# Patient Record
Sex: Female | Born: 1970
Health system: Southern US, Community
[De-identification: ages and names within clinical notes are randomized; demographics above are authoritative.]

## PROBLEM LIST (undated history)

## (undated) DIAGNOSIS — F209 Schizophrenia, unspecified: Secondary | ICD-10-CM

## (undated) DIAGNOSIS — F319 Bipolar disorder, unspecified: Secondary | ICD-10-CM

## (undated) DIAGNOSIS — J449 Chronic obstructive pulmonary disease, unspecified: Secondary | ICD-10-CM

## (undated) DIAGNOSIS — I509 Heart failure, unspecified: Secondary | ICD-10-CM

## (undated) DIAGNOSIS — Z5189 Encounter for other specified aftercare: Secondary | ICD-10-CM

## (undated) DIAGNOSIS — G43909 Migraine, unspecified, not intractable, without status migrainosus: Secondary | ICD-10-CM

## (undated) DIAGNOSIS — F32A Depression, unspecified: Secondary | ICD-10-CM

## (undated) DIAGNOSIS — F329 Major depressive disorder, single episode, unspecified: Secondary | ICD-10-CM

## (undated) DIAGNOSIS — F419 Anxiety disorder, unspecified: Secondary | ICD-10-CM

## (undated) HISTORY — PX: KNEE ARTHROSCOPY: SUR90

## (undated) HISTORY — PX: KNEE SURGERY: SHX244

## (undated) HISTORY — PX: TUBAL LIGATION: SHX77

---

## 1997-07-27 ENCOUNTER — Encounter (HOSPITAL_COMMUNITY): Admission: RE | Admit: 1997-07-27 | Discharge: 1997-09-01 | Payer: Self-pay | Admitting: Obstetrics

## 1997-08-31 ENCOUNTER — Inpatient Hospital Stay (HOSPITAL_COMMUNITY): Admission: AD | Admit: 1997-08-31 | Discharge: 1997-09-03 | Payer: Self-pay | Admitting: Obstetrics

## 1997-10-12 ENCOUNTER — Encounter: Admission: RE | Admit: 1997-10-12 | Discharge: 1997-10-12 | Payer: Self-pay | Admitting: Family Medicine

## 1997-12-05 ENCOUNTER — Emergency Department (HOSPITAL_COMMUNITY): Admission: EM | Admit: 1997-12-05 | Discharge: 1997-12-05 | Payer: Self-pay | Admitting: Emergency Medicine

## 1997-12-06 ENCOUNTER — Emergency Department (HOSPITAL_COMMUNITY): Admission: EM | Admit: 1997-12-06 | Discharge: 1997-12-06 | Payer: Self-pay | Admitting: Emergency Medicine

## 1997-12-07 ENCOUNTER — Inpatient Hospital Stay (HOSPITAL_COMMUNITY): Admission: AD | Admit: 1997-12-07 | Discharge: 1997-12-13 | Payer: Self-pay | Admitting: *Deleted

## 1997-12-07 ENCOUNTER — Emergency Department (HOSPITAL_COMMUNITY): Admission: EM | Admit: 1997-12-07 | Discharge: 1997-12-07 | Payer: Self-pay | Admitting: Emergency Medicine

## 1998-05-15 ENCOUNTER — Inpatient Hospital Stay (HOSPITAL_COMMUNITY): Admission: AD | Admit: 1998-05-15 | Discharge: 1998-05-16 | Payer: Self-pay | Admitting: Family Medicine

## 1998-05-23 ENCOUNTER — Emergency Department (HOSPITAL_COMMUNITY): Admission: EM | Admit: 1998-05-23 | Discharge: 1998-05-23 | Payer: Self-pay | Admitting: Emergency Medicine

## 1998-05-30 ENCOUNTER — Encounter: Admission: RE | Admit: 1998-05-30 | Discharge: 1998-05-30 | Payer: Self-pay | Admitting: Sports Medicine

## 1998-06-20 ENCOUNTER — Encounter: Admission: RE | Admit: 1998-06-20 | Discharge: 1998-06-20 | Payer: Self-pay | Admitting: Sports Medicine

## 1998-07-11 ENCOUNTER — Encounter: Admission: RE | Admit: 1998-07-11 | Discharge: 1998-07-11 | Payer: Self-pay | Admitting: Family Medicine

## 1998-07-17 ENCOUNTER — Emergency Department (HOSPITAL_COMMUNITY): Admission: EM | Admit: 1998-07-17 | Discharge: 1998-07-17 | Payer: Self-pay | Admitting: Emergency Medicine

## 1998-09-20 ENCOUNTER — Emergency Department (HOSPITAL_COMMUNITY): Admission: EM | Admit: 1998-09-20 | Discharge: 1998-09-20 | Payer: Self-pay | Admitting: Internal Medicine

## 1998-10-11 ENCOUNTER — Encounter: Admission: RE | Admit: 1998-10-11 | Discharge: 1998-10-11 | Payer: Self-pay | Admitting: Family Medicine

## 1998-10-25 ENCOUNTER — Inpatient Hospital Stay (HOSPITAL_COMMUNITY): Admission: EM | Admit: 1998-10-25 | Discharge: 1998-11-07 | Payer: Self-pay | Admitting: Emergency Medicine

## 1998-10-25 ENCOUNTER — Encounter: Admission: RE | Admit: 1998-10-25 | Discharge: 1998-10-25 | Payer: Self-pay | Admitting: Family Medicine

## 1999-03-08 ENCOUNTER — Emergency Department (HOSPITAL_COMMUNITY): Admission: EM | Admit: 1999-03-08 | Discharge: 1999-03-08 | Payer: Self-pay | Admitting: Emergency Medicine

## 1999-03-08 ENCOUNTER — Encounter: Payer: Self-pay | Admitting: Emergency Medicine

## 1999-03-15 ENCOUNTER — Encounter: Admission: RE | Admit: 1999-03-15 | Discharge: 1999-03-15 | Payer: Self-pay | Admitting: Family Medicine

## 1999-03-29 ENCOUNTER — Encounter: Admission: RE | Admit: 1999-03-29 | Discharge: 1999-03-29 | Payer: Self-pay | Admitting: Family Medicine

## 1999-04-02 ENCOUNTER — Encounter: Admission: RE | Admit: 1999-04-02 | Discharge: 1999-04-02 | Payer: Self-pay | Admitting: Family Medicine

## 1999-04-03 ENCOUNTER — Emergency Department (HOSPITAL_COMMUNITY): Admission: EM | Admit: 1999-04-03 | Discharge: 1999-04-03 | Payer: Self-pay | Admitting: Emergency Medicine

## 1999-04-03 ENCOUNTER — Encounter: Payer: Self-pay | Admitting: Family Medicine

## 1999-04-05 ENCOUNTER — Other Ambulatory Visit: Admission: RE | Admit: 1999-04-05 | Discharge: 1999-04-05 | Payer: Self-pay | Admitting: *Deleted

## 1999-04-05 ENCOUNTER — Encounter: Admission: RE | Admit: 1999-04-05 | Discharge: 1999-04-05 | Payer: Self-pay | Admitting: Family Medicine

## 1999-04-13 ENCOUNTER — Encounter: Admission: RE | Admit: 1999-04-13 | Discharge: 1999-04-13 | Payer: Self-pay | Admitting: Family Medicine

## 1999-05-01 ENCOUNTER — Encounter: Admission: RE | Admit: 1999-05-01 | Discharge: 1999-05-01 | Payer: Self-pay | Admitting: Sports Medicine

## 1999-05-03 ENCOUNTER — Emergency Department (HOSPITAL_COMMUNITY): Admission: EM | Admit: 1999-05-03 | Discharge: 1999-05-03 | Payer: Self-pay | Admitting: Emergency Medicine

## 1999-05-03 ENCOUNTER — Encounter: Payer: Self-pay | Admitting: Emergency Medicine

## 1999-06-25 ENCOUNTER — Encounter: Admission: RE | Admit: 1999-06-25 | Discharge: 1999-06-25 | Payer: Self-pay | Admitting: Family Medicine

## 1999-07-09 ENCOUNTER — Encounter: Admission: RE | Admit: 1999-07-09 | Discharge: 1999-07-09 | Payer: Self-pay | Admitting: Family Medicine

## 1999-08-15 ENCOUNTER — Inpatient Hospital Stay (HOSPITAL_COMMUNITY): Admission: EM | Admit: 1999-08-15 | Discharge: 1999-08-16 | Payer: Self-pay | Admitting: Emergency Medicine

## 1999-08-15 ENCOUNTER — Encounter: Payer: Self-pay | Admitting: Family Medicine

## 1999-09-17 ENCOUNTER — Encounter: Payer: Self-pay | Admitting: *Deleted

## 1999-09-18 ENCOUNTER — Inpatient Hospital Stay (HOSPITAL_COMMUNITY): Admission: EM | Admit: 1999-09-18 | Discharge: 1999-09-27 | Payer: Self-pay | Admitting: Emergency Medicine

## 1999-09-18 ENCOUNTER — Encounter: Payer: Self-pay | Admitting: Family Medicine

## 1999-09-19 ENCOUNTER — Encounter: Payer: Self-pay | Admitting: Urology

## 1999-09-20 ENCOUNTER — Encounter: Payer: Self-pay | Admitting: Family Medicine

## 1999-09-27 ENCOUNTER — Encounter: Payer: Self-pay | Admitting: Urology

## 1999-09-27 ENCOUNTER — Inpatient Hospital Stay (HOSPITAL_COMMUNITY): Admission: AD | Admit: 1999-09-27 | Discharge: 1999-10-09 | Payer: Self-pay | Admitting: *Deleted

## 1999-12-05 ENCOUNTER — Encounter: Admission: RE | Admit: 1999-12-05 | Discharge: 1999-12-05 | Payer: Self-pay | Admitting: Family Medicine

## 2000-01-29 ENCOUNTER — Encounter: Admission: RE | Admit: 2000-01-29 | Discharge: 2000-01-29 | Payer: Self-pay | Admitting: Sports Medicine

## 2000-03-10 ENCOUNTER — Encounter: Admission: RE | Admit: 2000-03-10 | Discharge: 2000-03-10 | Payer: Self-pay | Admitting: Family Medicine

## 2000-03-24 ENCOUNTER — Encounter: Admission: RE | Admit: 2000-03-24 | Discharge: 2000-03-24 | Payer: Self-pay | Admitting: Family Medicine

## 2001-01-29 ENCOUNTER — Encounter: Admission: RE | Admit: 2001-01-29 | Discharge: 2001-01-29 | Payer: Self-pay | Admitting: Family Medicine

## 2001-02-10 ENCOUNTER — Encounter: Admission: RE | Admit: 2001-02-10 | Discharge: 2001-02-10 | Payer: Self-pay | Admitting: Sports Medicine

## 2001-07-03 ENCOUNTER — Encounter: Admission: RE | Admit: 2001-07-03 | Discharge: 2001-07-03 | Payer: Self-pay | Admitting: Family Medicine

## 2002-03-26 ENCOUNTER — Encounter: Admission: RE | Admit: 2002-03-26 | Discharge: 2002-03-26 | Payer: Self-pay | Admitting: Family Medicine

## 2002-07-01 ENCOUNTER — Encounter: Admission: RE | Admit: 2002-07-01 | Discharge: 2002-07-01 | Payer: Self-pay | Admitting: Family Medicine

## 2002-09-15 ENCOUNTER — Emergency Department (HOSPITAL_COMMUNITY): Admission: EM | Admit: 2002-09-15 | Discharge: 2002-09-15 | Payer: Self-pay | Admitting: Emergency Medicine

## 2003-12-22 ENCOUNTER — Encounter: Admission: RE | Admit: 2003-12-22 | Discharge: 2003-12-22 | Payer: Self-pay | Admitting: Family Medicine

## 2004-01-20 ENCOUNTER — Encounter: Admission: RE | Admit: 2004-01-20 | Discharge: 2004-01-20 | Payer: Self-pay | Admitting: Family Medicine

## 2004-02-05 ENCOUNTER — Emergency Department (HOSPITAL_COMMUNITY): Admission: EM | Admit: 2004-02-05 | Discharge: 2004-02-05 | Payer: Self-pay | Admitting: Emergency Medicine

## 2004-02-10 ENCOUNTER — Emergency Department (HOSPITAL_COMMUNITY): Admission: EM | Admit: 2004-02-10 | Discharge: 2004-02-10 | Payer: Self-pay | Admitting: Emergency Medicine

## 2004-06-15 ENCOUNTER — Ambulatory Visit: Payer: Self-pay | Admitting: Family Medicine

## 2004-08-01 ENCOUNTER — Encounter: Admission: RE | Admit: 2004-08-01 | Discharge: 2004-10-08 | Payer: Self-pay | Admitting: Orthopedic Surgery

## 2004-09-28 ENCOUNTER — Ambulatory Visit: Payer: Self-pay | Admitting: Family Medicine

## 2004-12-28 ENCOUNTER — Ambulatory Visit: Payer: Self-pay | Admitting: Family Medicine

## 2005-04-09 ENCOUNTER — Other Ambulatory Visit: Admission: RE | Admit: 2005-04-09 | Discharge: 2005-04-09 | Payer: Self-pay | Admitting: Family Medicine

## 2005-04-09 ENCOUNTER — Ambulatory Visit: Payer: Self-pay | Admitting: Family Medicine

## 2005-06-27 ENCOUNTER — Ambulatory Visit: Payer: Self-pay | Admitting: Family Medicine

## 2005-10-28 ENCOUNTER — Ambulatory Visit: Payer: Self-pay | Admitting: Sports Medicine

## 2006-03-28 ENCOUNTER — Ambulatory Visit: Payer: Self-pay | Admitting: Family Medicine

## 2006-09-01 ENCOUNTER — Emergency Department (HOSPITAL_COMMUNITY): Admission: EM | Admit: 2006-09-01 | Discharge: 2006-09-01 | Payer: Self-pay | Admitting: Emergency Medicine

## 2006-09-02 ENCOUNTER — Emergency Department (HOSPITAL_COMMUNITY): Admission: EM | Admit: 2006-09-02 | Discharge: 2006-09-03 | Payer: Self-pay | Admitting: Emergency Medicine

## 2006-09-05 ENCOUNTER — Ambulatory Visit: Payer: Self-pay | Admitting: *Deleted

## 2006-09-05 ENCOUNTER — Inpatient Hospital Stay (HOSPITAL_COMMUNITY): Admission: AD | Admit: 2006-09-05 | Discharge: 2006-09-15 | Payer: Self-pay | Admitting: *Deleted

## 2006-10-02 ENCOUNTER — Telehealth: Payer: Self-pay | Admitting: *Deleted

## 2006-11-18 ENCOUNTER — Telehealth: Payer: Self-pay | Admitting: Psychology

## 2006-12-17 ENCOUNTER — Telehealth: Payer: Self-pay | Admitting: Psychology

## 2006-12-23 ENCOUNTER — Encounter: Payer: Self-pay | Admitting: Family Medicine

## 2006-12-24 ENCOUNTER — Ambulatory Visit: Payer: Self-pay | Admitting: Family Medicine

## 2006-12-24 ENCOUNTER — Encounter: Payer: Self-pay | Admitting: Family Medicine

## 2006-12-24 LAB — CONVERTED CEMR LAB
AST: 8 units/L (ref 0–37)
CO2: 23 meq/L (ref 19–32)
Glucose, Bld: 85 mg/dL (ref 70–99)
LDL Cholesterol: 79 mg/dL (ref 0–99)
Potassium: 3.9 meq/L (ref 3.5–5.3)
Total Bilirubin: 0.3 mg/dL (ref 0.3–1.2)
Triglycerides: 259 mg/dL — ABNORMAL HIGH (ref <150)
VLDL: 52 mg/dL — ABNORMAL HIGH (ref 0–40)

## 2006-12-25 ENCOUNTER — Encounter: Payer: Self-pay | Admitting: Family Medicine

## 2007-01-26 ENCOUNTER — Encounter: Payer: Self-pay | Admitting: Psychology

## 2007-02-04 ENCOUNTER — Ambulatory Visit: Payer: Self-pay | Admitting: Family Medicine

## 2007-02-27 ENCOUNTER — Encounter: Payer: Self-pay | Admitting: Family Medicine

## 2007-03-04 ENCOUNTER — Other Ambulatory Visit: Admission: RE | Admit: 2007-03-04 | Discharge: 2007-03-04 | Payer: Self-pay | Admitting: Family Medicine

## 2007-03-04 ENCOUNTER — Ambulatory Visit: Payer: Self-pay | Admitting: Family Medicine

## 2007-03-04 ENCOUNTER — Encounter: Payer: Self-pay | Admitting: Family Medicine

## 2007-03-04 LAB — CONVERTED CEMR LAB
Hep B S Ab: NEGATIVE
KOH Prep: POSITIVE

## 2007-03-05 ENCOUNTER — Telehealth: Payer: Self-pay | Admitting: Family Medicine

## 2007-03-05 ENCOUNTER — Encounter: Payer: Self-pay | Admitting: Family Medicine

## 2007-03-05 DIAGNOSIS — G43909 Migraine, unspecified, not intractable, without status migrainosus: Secondary | ICD-10-CM | POA: Insufficient documentation

## 2007-04-20 ENCOUNTER — Telehealth: Payer: Self-pay | Admitting: Family Medicine

## 2007-04-29 ENCOUNTER — Ambulatory Visit: Payer: Self-pay | Admitting: Psychology

## 2007-04-29 ENCOUNTER — Encounter: Payer: Self-pay | Admitting: *Deleted

## 2007-04-29 LAB — CONVERTED CEMR LAB
ALT: 13 units/L (ref 0–35)
AST: 9 units/L (ref 0–37)
Albumin: 4.6 g/dL (ref 3.5–5.2)
Alkaline Phosphatase: 59 units/L (ref 39–117)
LDL Cholesterol: 121 mg/dL — ABNORMAL HIGH (ref 0–99)
Potassium: 4.2 meq/L (ref 3.5–5.3)
Sodium: 140 meq/L (ref 135–145)
TSH: 0.594 microintl units/mL (ref 0.350–5.50)
Total Bilirubin: 0.3 mg/dL (ref 0.3–1.2)
Total Protein: 7.2 g/dL (ref 6.0–8.3)
Triglycerides: 213 mg/dL — ABNORMAL HIGH (ref <150)
VLDL: 43 mg/dL — ABNORMAL HIGH (ref 0–40)

## 2007-05-27 ENCOUNTER — Telehealth: Payer: Self-pay | Admitting: Psychology

## 2007-07-01 ENCOUNTER — Ambulatory Visit: Payer: Self-pay | Admitting: Psychology

## 2007-09-15 ENCOUNTER — Telehealth: Payer: Self-pay | Admitting: Psychology

## 2007-12-30 ENCOUNTER — Encounter: Payer: Self-pay | Admitting: Psychology

## 2007-12-30 ENCOUNTER — Ambulatory Visit: Payer: Self-pay | Admitting: Family Medicine

## 2007-12-30 LAB — CONVERTED CEMR LAB
Blood Glucose, Fingerstick: 139
Hgb A1c MFr Bld: 6.5 %

## 2007-12-31 LAB — CONVERTED CEMR LAB
CO2: 21 meq/L (ref 19–32)
Calcium: 9.3 mg/dL (ref 8.4–10.5)
Creatinine, Ser: 0.72 mg/dL (ref 0.40–1.20)
Sodium: 139 meq/L (ref 135–145)

## 2008-01-13 ENCOUNTER — Ambulatory Visit: Payer: Self-pay | Admitting: Psychology

## 2008-02-10 ENCOUNTER — Encounter: Payer: Self-pay | Admitting: Psychology

## 2008-02-11 ENCOUNTER — Telehealth: Payer: Self-pay | Admitting: Psychology

## 2008-02-16 ENCOUNTER — Encounter: Payer: Self-pay | Admitting: Family Medicine

## 2008-03-04 ENCOUNTER — Encounter: Payer: Self-pay | Admitting: Psychology

## 2008-03-15 ENCOUNTER — Telehealth: Payer: Self-pay | Admitting: Psychology

## 2008-03-30 ENCOUNTER — Ambulatory Visit: Payer: Self-pay | Admitting: Family Medicine

## 2008-07-04 ENCOUNTER — Telehealth: Payer: Self-pay | Admitting: Psychology

## 2008-09-14 ENCOUNTER — Emergency Department (HOSPITAL_COMMUNITY): Admission: EM | Admit: 2008-09-14 | Discharge: 2008-09-14 | Payer: Self-pay | Admitting: Emergency Medicine

## 2008-11-22 ENCOUNTER — Emergency Department (HOSPITAL_COMMUNITY): Admission: EM | Admit: 2008-11-22 | Discharge: 2008-11-22 | Payer: Self-pay | Admitting: Emergency Medicine

## 2008-11-24 ENCOUNTER — Telehealth: Payer: Self-pay | Admitting: Family Medicine

## 2008-11-25 ENCOUNTER — Ambulatory Visit: Payer: Self-pay | Admitting: Family Medicine

## 2008-11-25 ENCOUNTER — Encounter: Payer: Self-pay | Admitting: *Deleted

## 2008-11-25 DIAGNOSIS — F312 Bipolar disorder, current episode manic severe with psychotic features: Secondary | ICD-10-CM

## 2008-11-25 LAB — CONVERTED CEMR LAB
Glucose, Urine, Semiquant: NEGATIVE
Ketones, urine, test strip: NEGATIVE
Nitrite: NEGATIVE
Specific Gravity, Urine: 1.02
pH: 7

## 2008-12-02 ENCOUNTER — Telehealth (INDEPENDENT_AMBULATORY_CARE_PROVIDER_SITE_OTHER): Payer: Self-pay | Admitting: Family Medicine

## 2008-12-05 ENCOUNTER — Telehealth: Payer: Self-pay | Admitting: Family Medicine

## 2008-12-06 ENCOUNTER — Telehealth: Payer: Self-pay | Admitting: Family Medicine

## 2008-12-07 ENCOUNTER — Telehealth: Payer: Self-pay | Admitting: Family Medicine

## 2008-12-13 ENCOUNTER — Ambulatory Visit: Payer: Self-pay | Admitting: Family Medicine

## 2008-12-13 DIAGNOSIS — F17200 Nicotine dependence, unspecified, uncomplicated: Secondary | ICD-10-CM

## 2008-12-23 ENCOUNTER — Telehealth: Payer: Self-pay | Admitting: Family Medicine

## 2009-10-14 ENCOUNTER — Telehealth: Payer: Self-pay | Admitting: Family Medicine

## 2009-10-15 ENCOUNTER — Emergency Department (HOSPITAL_COMMUNITY): Admission: EM | Admit: 2009-10-15 | Discharge: 2009-10-15 | Payer: Self-pay | Admitting: Emergency Medicine

## 2009-10-16 ENCOUNTER — Telehealth: Payer: Self-pay | Admitting: Family Medicine

## 2009-10-17 ENCOUNTER — Emergency Department (HOSPITAL_COMMUNITY): Admission: EM | Admit: 2009-10-17 | Discharge: 2009-10-18 | Payer: Self-pay | Admitting: Emergency Medicine

## 2009-10-17 ENCOUNTER — Encounter: Payer: Self-pay | Admitting: Family Medicine

## 2009-10-20 ENCOUNTER — Ambulatory Visit: Payer: Self-pay | Admitting: Diagnostic Radiology

## 2009-10-20 ENCOUNTER — Emergency Department (HOSPITAL_BASED_OUTPATIENT_CLINIC_OR_DEPARTMENT_OTHER): Admission: EM | Admit: 2009-10-20 | Discharge: 2009-10-20 | Payer: Self-pay | Admitting: Emergency Medicine

## 2009-10-26 ENCOUNTER — Ambulatory Visit: Payer: Self-pay | Admitting: Family Medicine

## 2009-10-26 ENCOUNTER — Encounter: Payer: Self-pay | Admitting: Family Medicine

## 2009-10-26 DIAGNOSIS — E669 Obesity, unspecified: Secondary | ICD-10-CM | POA: Insufficient documentation

## 2009-10-26 DIAGNOSIS — I10 Essential (primary) hypertension: Secondary | ICD-10-CM | POA: Insufficient documentation

## 2009-10-26 DIAGNOSIS — E119 Type 2 diabetes mellitus without complications: Secondary | ICD-10-CM | POA: Insufficient documentation

## 2009-10-26 DIAGNOSIS — R209 Unspecified disturbances of skin sensation: Secondary | ICD-10-CM | POA: Insufficient documentation

## 2009-10-26 LAB — CONVERTED CEMR LAB: Hgb A1c MFr Bld: 7.9 %

## 2009-10-27 ENCOUNTER — Encounter: Payer: Self-pay | Admitting: Family Medicine

## 2009-10-27 LAB — CONVERTED CEMR LAB
ALT: 16 units/L (ref 0–35)
AST: 12 units/L (ref 0–37)
Albumin: 4.9 g/dL (ref 3.5–5.2)
Alkaline Phosphatase: 94 units/L (ref 39–117)
BUN: 10 mg/dL (ref 6–23)
CO2: 26 meq/L (ref 19–32)
Calcium: 9.8 mg/dL (ref 8.4–10.5)
Chloride: 97 meq/L (ref 96–112)
Cholesterol: 216 mg/dL — ABNORMAL HIGH (ref 0–200)
Creatinine, Ser: 0.89 mg/dL (ref 0.40–1.20)
Folate: 5.6 ng/mL
Glucose, Bld: 129 mg/dL — ABNORMAL HIGH (ref 70–99)
HCT: 43.8 % (ref 36.0–46.0)
HDL: 46 mg/dL (ref >39)
Hemoglobin: 14.3 g/dL (ref 12.0–15.0)
MCHC: 32.6 g/dL (ref 30.0–36.0)
MCV: 86.9 fL (ref 78.0–100.0)
Platelets: 271 10*3/uL (ref 150–400)
Potassium: 3.8 meq/L (ref 3.5–5.3)
RBC: 5.04 M/uL (ref 3.87–5.11)
RDW: 13.3 % (ref 11.5–15.5)
Sodium: 136 meq/L (ref 135–145)
TSH: 2.241 microintl units/mL (ref 0.350–4.500)
Total Bilirubin: 0.3 mg/dL (ref 0.3–1.2)
Total CHOL/HDL Ratio: 4.7
Total Protein: 7.5 g/dL (ref 6.0–8.3)
Triglycerides: 466 mg/dL — ABNORMAL HIGH (ref <150)
Vit D, 25-Hydroxy: 14 ng/mL — ABNORMAL LOW (ref 30–89)
Vitamin B-12: 812 pg/mL (ref 211–911)
WBC: 11.5 10*3/uL — ABNORMAL HIGH (ref 4.0–10.5)

## 2010-06-02 ENCOUNTER — Emergency Department (HOSPITAL_COMMUNITY)
Admission: EM | Admit: 2010-06-02 | Discharge: 2010-06-03 | Disposition: A | Discharge status: Other Acute Inpatient Hospital | Payer: Self-pay | Source: Home / Self Care | Admitting: Emergency Medicine

## 2010-06-03 ENCOUNTER — Inpatient Hospital Stay (HOSPITAL_COMMUNITY)
Admission: AD | Admit: 2010-06-03 | Discharge: 2010-06-08 | Payer: Self-pay | Attending: Psychiatry | Admitting: Psychiatry

## 2010-06-13 ENCOUNTER — Ambulatory Visit: Admit: 2010-06-13 | Payer: Self-pay

## 2010-06-24 ENCOUNTER — Emergency Department (HOSPITAL_COMMUNITY)
Admission: EM | Admit: 2010-06-24 | Discharge: 2010-06-24 | Payer: Self-pay | Source: Home / Self Care | Admitting: Emergency Medicine

## 2010-06-25 ENCOUNTER — Inpatient Hospital Stay (HOSPITAL_COMMUNITY)
Admission: AD | Admit: 2010-06-25 | Discharge: 2010-07-02 | Payer: Self-pay | Attending: Psychiatry | Admitting: Psychiatry

## 2010-06-25 ENCOUNTER — Emergency Department (HOSPITAL_COMMUNITY)
Admission: EM | Admit: 2010-06-25 | Discharge: 2010-06-25 | Disposition: A | Discharge status: Other Acute Inpatient Hospital | Payer: Self-pay | Source: Home / Self Care | Admitting: Emergency Medicine

## 2010-06-25 LAB — URINALYSIS, ROUTINE W REFLEX MICROSCOPIC
Bilirubin Urine: NEGATIVE
Hgb urine dipstick: NEGATIVE
Ketones, ur: 15 mg/dL — AB
Leukocytes, UA: NEGATIVE
Nitrite: NEGATIVE
Protein, ur: NEGATIVE mg/dL
Specific Gravity, Urine: 1.041 — ABNORMAL HIGH (ref 1.005–1.030)
Urine Glucose, Fasting: >1000 mg/dL — AB
Urobilinogen, UA: 1 mg/dL (ref 0.0–1.0)
pH: 5.5 (ref 5.0–8.0)

## 2010-06-25 LAB — POCT PREGNANCY, URINE: Preg Test, Ur: NEGATIVE

## 2010-06-25 LAB — URINE MICROSCOPIC-ADD ON

## 2010-06-27 LAB — CBC
HCT: 38.7 % (ref 36.0–46.0)
Hemoglobin: 12.9 g/dL (ref 12.0–15.0)
MCH: 29.5 pg (ref 26.0–34.0)
MCHC: 33.3 g/dL (ref 30.0–36.0)
MCV: 88.6 fL (ref 78.0–100.0)
Platelets: 218 10*3/uL (ref 150–400)
RBC: 4.37 MIL/uL (ref 3.87–5.11)
RDW: 13.7 % (ref 11.5–15.5)
WBC: 11 10*3/uL — ABNORMAL HIGH (ref 4.0–10.5)

## 2010-06-27 LAB — RAPID URINE DRUG SCREEN, HOSP PERFORMED
Amphetamines: NOT DETECTED
Barbiturates: NOT DETECTED
Benzodiazepines: NOT DETECTED
Cocaine: NOT DETECTED
Opiates: NOT DETECTED
Tetrahydrocannabinol: NOT DETECTED

## 2010-06-27 LAB — COMPREHENSIVE METABOLIC PANEL
ALT: 20 U/L (ref 0–35)
AST: 16 U/L (ref 0–37)
Albumin: 3.6 g/dL (ref 3.5–5.2)
Alkaline Phosphatase: 78 U/L (ref 39–117)
BUN: 10 mg/dL (ref 6–23)
CO2: 22 mEq/L (ref 19–32)
Calcium: 8.8 mg/dL (ref 8.4–10.5)
Chloride: 103 mEq/L (ref 96–112)
Creatinine, Ser: 0.95 mg/dL (ref 0.4–1.2)
GFR calc Af Amer: >60 mL/min (ref >60)
GFR calc non Af Amer: >60 mL/min (ref >60)
Glucose, Bld: 356 mg/dL — ABNORMAL HIGH (ref 70–99)
Potassium: 3.8 mEq/L (ref 3.5–5.1)
Sodium: 134 mEq/L — ABNORMAL LOW (ref 135–145)
Total Bilirubin: 0.6 mg/dL (ref 0.3–1.2)
Total Protein: 6.6 g/dL (ref 6.0–8.3)

## 2010-06-27 LAB — GLUCOSE, CAPILLARY
Glucose-Capillary: 324 mg/dL — ABNORMAL HIGH (ref 70–99)
Glucose-Capillary: 255 mg/dL — ABNORMAL HIGH (ref 70–99)
Glucose-Capillary: 187 mg/dL — ABNORMAL HIGH (ref 70–99)
Glucose-Capillary: 159 mg/dL — ABNORMAL HIGH (ref 70–99)
Glucose-Capillary: 168 mg/dL — ABNORMAL HIGH (ref 70–99)
Glucose-Capillary: 211 mg/dL — ABNORMAL HIGH (ref 70–99)
Glucose-Capillary: 212 mg/dL — ABNORMAL HIGH (ref 70–99)
Glucose-Capillary: 157 mg/dL — ABNORMAL HIGH (ref 70–99)
Glucose-Capillary: 187 mg/dL — ABNORMAL HIGH (ref 70–99)

## 2010-06-27 LAB — URINALYSIS, ROUTINE W REFLEX MICROSCOPIC
Bilirubin Urine: NEGATIVE
Hgb urine dipstick: NEGATIVE
Ketones, ur: NEGATIVE mg/dL
Leukocytes, UA: NEGATIVE
Nitrite: NEGATIVE
Protein, ur: NEGATIVE mg/dL
Specific Gravity, Urine: 1.037 — ABNORMAL HIGH (ref 1.005–1.030)
Urine Glucose, Fasting: >1000 mg/dL — AB
Urobilinogen, UA: 0.2 mg/dL (ref 0.0–1.0)
pH: 6 (ref 5.0–8.0)

## 2010-06-27 LAB — DIFFERENTIAL
Basophils Absolute: 0 10*3/uL (ref 0.0–0.1)
Basophils Relative: 0 % (ref 0–1)
Eosinophils Absolute: 0.1 10*3/uL (ref 0.0–0.7)
Eosinophils Relative: 1 % (ref 0–5)
Lymphocytes Relative: 21 % (ref 12–46)
Lymphs Abs: 2.3 10*3/uL (ref 0.7–4.0)
Monocytes Absolute: 0.8 10*3/uL (ref 0.1–1.0)
Monocytes Relative: 7 % (ref 3–12)
Neutro Abs: 7.7 10*3/uL (ref 1.7–7.7)
Neutrophils Relative %: 70 % (ref 43–77)

## 2010-06-27 LAB — HCG, SERUM, QUALITATIVE: Preg, Serum: NEGATIVE

## 2010-06-27 LAB — URINE MICROSCOPIC-ADD ON

## 2010-06-27 LAB — VALPROIC ACID LEVEL: Valproic Acid Lvl: <10 ug/mL — ABNORMAL LOW (ref 50.0–100.0)

## 2010-06-27 LAB — ETHANOL: Alcohol, Ethyl (B): <5 mg/dL (ref 0–10)

## 2010-07-02 LAB — GLUCOSE, CAPILLARY
Glucose-Capillary: 180 mg/dL — ABNORMAL HIGH (ref 70–99)
Glucose-Capillary: 175 mg/dL — ABNORMAL HIGH (ref 70–99)
Glucose-Capillary: 147 mg/dL — ABNORMAL HIGH (ref 70–99)
Glucose-Capillary: 236 mg/dL — ABNORMAL HIGH (ref 70–99)

## 2010-07-03 LAB — GLUCOSE, CAPILLARY
Glucose-Capillary: 169 mg/dL — ABNORMAL HIGH (ref 70–99)
Glucose-Capillary: 152 mg/dL — ABNORMAL HIGH (ref 70–99)
Glucose-Capillary: 147 mg/dL — ABNORMAL HIGH (ref 70–99)
Glucose-Capillary: 132 mg/dL — ABNORMAL HIGH (ref 70–99)
Glucose-Capillary: 227 mg/dL — ABNORMAL HIGH (ref 70–99)
Glucose-Capillary: 132 mg/dL — ABNORMAL HIGH (ref 70–99)
Glucose-Capillary: 142 mg/dL — ABNORMAL HIGH (ref 70–99)

## 2010-07-05 ENCOUNTER — Encounter: Payer: Self-pay | Admitting: Psychology

## 2010-07-06 NOTE — H&P (Signed)
[name] [name], LEOPARD NO.:  [id number]  MEDICAL RECORD NO.:  [id number]          PATIENT TYPE:  IPS  LOCATION:  0406                          [facility]ACILITY[facility]  [facility]  PHYSICIAN:  Anselm Jungling, MD  DATE OF BIRTH:  1970-09-20  DATE OF ADMISSION:  06/25/2010 DATE OF DISCHARGE:                      PSYCHIATRIC ADMISSION ASSESSMENT   This is a 40 year old female who was involuntary petitioned on June 25, 2010.  HISTORY OF PRESENT ILLNESS:  The patient is here on papers that state the patient was found naked and confused and delusional and combative. The patient presented to the emergency department.  She was having some violent behavior.  She stated that she was 8 months pregnant.  Again, was found at home naked and confused.  PAST PSYCHIATRIC HISTORY:  Second admission.  The patient was discharge from our facility on June 08, 2010, admitted after the patient was wandering around a hotel property, stating that she had not slept and very disorganized.  The patient was referred to [facility] with an appointment on June 15, 2010.  SOCIAL HISTORY:  The patient lives alone.  She is divorced.  She has a Research officer, trade union, on disability.  FAMILY HISTORY:  None.  ALCOHOL AND DRUG HISTORY:  No apparent alcohol or substance use.  PRIMARY CARE PROVIDER:  [facility].  MEDICAL PROBLEMS: 1. History of type 2 diabetes. 2. Asthma. 3. Hypertension.  MEDICATIONS:  The patient was discharged on: 1. Albuterol 2 puffs q.4 hours as needed. 2. Depakote ER 250 mg b.i.d. 3. Vistaril 50 q.4 hours p.r.n. anxiety. 4. Metformin 500 mg daily. 5. Risperdal 0.5 b.i.d. 6. Advair Diskus 1 puff twice daily. 7. Lisinopril hydrochlorothiazide 20/12.5 one daily.  DRUG ALLERGIES:  No known allergies.  PHYSICAL EXAM:  Physical exam was done in the emergency department. Again, the patient was argumentative, combative with delusional thinking.  Her  glucose is 324.  Her white count is 11.  Urine drug screen is negative.  Her urine is negative.  Urine pregnancy test is negative.  Alcohol level less than 5.  Valproic acid level is less than 10.  The patient had become agitated and threatened to leave, and involuntary papers were taken out and signed.  The patient also received Geodon 20 mg IM for agitation.  MENTAL STATUS EXAM:  The patient remains very delusional and disorganized, irritable, minimal insight, avoiding participation in any groups.  Axis I:  Schizoaffective disorder not otherwise specified. Axis II:  Deferred. Axis III:  History of diabetes, hypertension and asthma. Axis IV:  Medical problems, other psychosocial problems related to burden of illness. Axis V:  Current is 30.  PLAN:  Monitor her blood sugars and put patient on a sliding scale.  We will continue with her metformin.  We will also continue with her Risperdal and Depakote to stabilize her thinking.  Continue to assess stressors at home.  Reinforce med compliance.  Her tentative length of stay is 2-4 days.     Landry Corporal, N.P.   ______________________________ Anselm Jungling, MD    JO/MEDQ  D:  06/26/2010  T:  06/26/2010  Job:  [id number]  Electronically Signed by Limmie PatriciaP. on 07/03/2010 02:10:50 PM Electronically Signed by Geralyn Flash MD on 07/04/2010 08:46:13 AM

## 2010-07-06 NOTE — Discharge Summary (Signed)
NAMEBILL, MCVEY NO.:  [id number]  MEDICAL RECORD NO.:  [id number]          PATIENT TYPE:  IPS  LOCATION:  0406                          [facility]ACILITY:  [facility]  PHYSICIAN:  Anselm Jungling, MD  DATE OF BIRTH:  1971-03-01  DATE OF ADMISSION:  06/25/2010 DATE OF DISCHARGE:  07/02/2010                              DISCHARGE SUMMARY   IDENTIFYING DATA AND REASON FOR ADMISSION:  This was an inpatient psychiatric admission for "[name] [name] a 40 year old single female who was last discharged from our service on June 08, 2010.  She returned to [place] due to decompensation of her schizoaffective disorder.  Please refer to the admission note for further details pertaining to the symptoms, circumstances, and history that led to her hospitalization.  MEDICAL AND LABORATORY:  The patient was medically and physically assessed by the psychiatric nurse practitioner.  She was in good health without any active or chronic medical problems, except for diabetes mellitus, which was treated with Glucophage 500 mg daily.  There were no significant medical issues.  HOSPITAL COURSE:  The patient was admitted to the Adult Inpatient Psychiatric Service.  She presented as a well-nourished, normally- developed adult female who was floridly psychotic, with the belief that she was pregnant.  Thoughts were disorganized.  Judgment and insight were severely impaired.  She made nonsensical statements, displayed inappropriate laughter, with a lot of punning and lining.  However, she did understand that she was in a treatment situation for psychiatric disorders and she was generally cooperative.  She was stabilized on her regiment of Depakote and risperidone, and made gradual but steady progress over a 1-week hospital stay.  The patient was open to being referred to an assertive community treatment team.  Such an agency was able to come and visit her during her stay and from there, was able to  work with case management towards an aftercare plan that included their involvement.  The patient was also visited by the Department of Social Services during her stay regarding matters pertaining to her children.  The patient did have some emotional reaction to this, but was able to tolerate the overall situation without it turning into a crisis that undermined her progress.  The patient appeared appropriate for discharge after 1 week of hospital treatment.  Her thoughts were much clearer, her insight and comprehension was fairly good, and she was tolerating medication well. It was apparent that she could be safely discharged from the hospital, and because of the involvement of the assertive community treatment team, it was felt that she would have excellent support.  She agreed to the followup aftercare plan.  AFTERCARE:  The patient was to follow up with assertive community treatment team with appointments on July 03, 2010, for intake and July 06, 2010, to see their physician.  DISCHARGE MEDICATIONS: 1. Glucophage 500 mg daily. 2. Risperdal 2 mg b.i.d. 3. Depakote 500 mg q.a.m. and 1000 mg at bedtime.  DISCHARGE DIAGNOSES:  Axis I:  Schizoaffective disorder, not otherwise specified. Axis II:  Deferred. Axis III:  History of diabetes mellitus, hypertension. Axis IV:  Stressors severe. Axis V:  Global  assessment of functioning on discharge 45.     Anselm Jungling, MD     SPB/MEDQ  D:  07/03/2010  T:  07/03/2010  Job:  [id number]  Electronically Signed by Geralyn Flash MD on 07/04/2010 08:46:16 AM

## 2010-07-10 NOTE — Progress Notes (Signed)
Summary: triage  Phone Note Call from Patient Call back at [phone]   Caller: Patient Summary of Call: Pt is returning Sally's call. Initial call taken by: Clydell Hakim,  Oct 16, 2009 1:51 PM  Follow-up for Phone Call        she went to [facility] yesterday. had labs. was told her kidneys are fine.  cbg was 206. high bp. was given fluid pills. lasix 20mg  daily. states she cannot tell the difference with the pill. seems to be voiding same amount & swelling is still there.   hands & feet are tingling & pressure.  states she cannot come in today. appt at 9 tomorrow made.  advised if she became SOB or the swelling increased, go to ED. she agreed with plan called [facility] & asked that records be faxed here Follow-up by: Golden Circle RN,  Oct 16, 2009 2:04 PM     Appended Document: triage LM for her to call me. (she missed 9am appt) I wanted to know if she went to ED,if so will need f/u. placed records from [facility] in pcp chart box

## 2010-07-10 NOTE — Assessment & Plan Note (Signed)
Summary: f/u ed,df   Vital Signs:  Patient profile:   40 year old female Height:      65 inches Weight:      215 pounds BMI:     35.91 Temp:     98.7 degrees F oral Pulse rate:   106 / minute BP sitting:   144 / 95  (left arm) Cuff size:   large  Vitals Entered By: Tessie Fass CMA (Oct 26, 2009 9:51 AM) CC: hospital follow up Is Patient Diabetic? Yes Pain Assessment Patient in pain? yes     Location: head Intensity: 6   Diabetic Foot Exam Foot Inspection Is there a history of a foot ulcer?              No Is there a foot ulcer now?              No Can the patient see the bottom of their feet?          Yes Are the shoes appropriate in style and fit?          Yes Is there swelling or an abnormal foot shape?          Yes Are the toenails long?                No Are the toenails thick?                No Are the toenails ingrown?              No Is there heavy callous build-up?              No Is there pain in the calf muscle (Intermittent claudication) when walking?    NoIs there a claw toe deformity?              No Is there elevated skin temperature?            No Is there limited ankle dorsiflexion?            No Is there foot or ankle muscle weakness?            No  Diabetic Foot Care Education Pulse Check          Right Foot          Left Foot Posterior Tibial:        normal            normal Dorsalis Pedis:        normal            normal  High Risk Feet? No   10-g (5.07) Semmes-Weinstein Monofilament Test           Right Foot          Left Foot Visual Inspection     normal         normal Test Control      abnormal         abnormal Site 1         abnormal         abnormal Site 2         abnormal         abnormal Site 3         abnormal         abnormal Site 4         abnormal         abnormal Site 5  abnormal         abnormal Site 6         abnormal         abnormal Site 7         abnormal         abnormal Site 8         abnormal          abnormal Site 9         abnormal         abnormal Site 10         abnormal         abnormal  Impression      abnormal         abnormal   Primary Care Provider:  Helane Rima DO  CC:  hospital follow up.  History of Present Illness: 40 year old F:  1. Numbness/Tingling in Hands/Feet: x 2 weeks, acute onset, started in left hand - then also in right hand - then also in both feet. + DM with last documented A1c = 6.5. Was seen 5/11 at [facility] and Rx Metformin 500mg  by mouth daily.   2. HTN: No prior Dx. Was "high" at [facility]. She was Rx HCTZ 25 mg by mouth daily. Today, BP 144/95 after one week of medication.  3. Bipolar: Rx Seroquel 100 mg by mouth q hs. Looking back, she was previously on Lamictal and Seroquel with the intention to taper off Seroquel 2/2 weight gain, hyperglycemia. She has not been seen in [facility] since 10/09. She did not want to go back before because she felt "fine," but now admits to more stress, more depressive-type symptoms, and insomnia. Denies S/HI. Depressive symptoms include fatigue, sadness, episodes of crying, lack of motivation. Stressors include new grandchild, her daughter with recent fall requiring sutures, and her mother in the ICU.   Habits & Providers  Alcohol-Tobacco-Diet     Tobacco Status: current     Tobacco Counseling: to quit use of tobacco products     Cigarette Packs/Day: 1.0  Current Medications (verified): 1)  Albuterol 90 Mcg/act Aers (Albuterol) .... Inhale 1 Puff Every Four To Six Hours 2)  Seroquel 100 Mg  Tabs (Quetiapine Fumarate) .... One By Mouth Daily. 3)  Omeprazole 20 Mg Cpdr (Omeprazole) .... One By Mouth Daily 4)  Lisinopril-Hydrochlorothiazide 20-12.5 Mg Tabs (Lisinopril-Hydrochlorothiazide) .Marland Kitchen.. 1 Tablet By Mouth Daily 5)  Metformin Hcl 1000 Mg Tabs (Metformin Hcl) .... Take 1 Tablet By Mouth Two Times A Day 6)  Lamictal 25 Mg Tabs (Lamotrigine) .... One By Mouth Daily X 2 Weeks, Then 2 By Mouth Daily X 2 Weeks, Then 4 By Mouth Daily -  For Bipolar  Allergies (verified): No Known Drug Allergies  Past History:  Past Medical History: Darvocet Dependence/Drug Seeking Behavior (06/2002) Urosepsis from Obstructing R Ureteral Stone (4/01) HTN DM Bipolar Disorder Obesity Tobacco Abuse  Family History: MGM - Asthma, Nephrolithiasis  Social History: Divorced from husband - cheated on her; 4 children, both with father; + smoker. Denies alcohol, illicit drugs.Packs/Day:  1.0  Review of Systems General:  Denies chills and fever. CV:  Denies chest pain or discomfort, shortness of breath with exertion, and swelling of feet. GI:  Denies change in bowel habits. MS:  Denies joint pain. Derm:  Denies rash. Neuro:  Complains of numbness and tingling; denies falling down and headaches. Psych:  Complains of anxiety, depression, and easily tearful; denies suicidal thoughts/plans, unusual visions or sounds, and thoughts /plans of harming  others. Endo:  Denies cold intolerance, excessive thirst, excessive urination, and heat intolerance. Heme:  Denies bleeding.  Physical Exam  General:  Timid, flat affect, NAD. Vitals reviewed. Neck:  No deformities, masses, or tenderness noted. Lungs:  Normal respiratory effort, chest expands symmetrically. Lungs are clear to auscultation, no crackles or wheezes. Heart:  Normal rate and regular rhythm. S1 and S2 normal without gallop, murmur, click, rub or other extra sounds. Pulses:  R and L radial, dorsalis pedis and posterior tibial pulses are full and equal bilaterally. Extremities:  No clubbing, cyanosis, edema, or deformity noted.  Neurologic:  alert & oriented X3, cranial nerves II-XII intact, strength normal in all extremities, gait normal, and DTRs symmetrical and normal.  Reported decreased sensation hands/feet. Skin:  Intact without suspicious lesions or rashes. Psych:  Oriented X3, memory intact for recent and remote, normally interactive, labile affect, slightly anxious.   Diabetes  Management Exam:    Foot Exam (with socks and/or shoes not present):       Sensory-Monofilament:          Left foot: abnormal          Right foot: abnormal   Impression & Recommendations:  Problem # 1:  DISTURBANCE OF SKIN SENSATION (ICD-782.0) Assessment New Discussed DDx with patient, including hyperglycemia, electrolyte or vitamin abnormality, thyroid, and anemia. Will check labs today. No RED FLAGS. Possibilty of component of uncontrolled Bipolar in this patient contributing to picture. The patient did not seem surprised when this was mentioned.  Orders: CBC-[facility] ([id number]) TSH-[facility] [id number]) Vit D, 25 OH-[facility] [id number]) Folate-[facility] [id number]) B12-[facility] [id number]) [facility]- Est  Level 4 (24401)  Problem # 2:  ESSENTIAL HYPERTENSION (ICD-401.9) Assessment: New Will add Lisinopril for blood pressure and renal protection. Her updated medication list for this problem includes:    Lisinopril-hydrochlorothiazide 20-12.5 Mg Tabs (Lisinopril-hydrochlorothiazide) .Marland Kitchen... 1 tablet by mouth daily  Orders: Comp Met-[facility] ([id number]) Lipid-[facility] ([id number]) [facility]- Est  Level 4 (38756)  Problem # 3:  DM (ICD-250.00) Assessment: Deteriorated A1c 7.9. Increase Metformin. Will refer to Nutrition Therapy. Her updated medication list for this problem includes:    Lisinopril-hydrochlorothiazide 20-12.5 Mg Tabs (Lisinopril-hydrochlorothiazide) .Marland Kitchen... 1 tablet by mouth daily    Metformin Hcl 1000 Mg Tabs (Metformin hcl) .Marland Kitchen... Take 1 tablet by mouth two times a day  Orders: Comp Met-[facility] ([id number]) Lipid-[facility] ([id number]) A1C-[facility] (10932) [facility]- Est  Level 4 (35573) Nutrition Referral (Nutrition)  Problem # 4:  BIPOLAR AFFECTIVE DISORDER (ICD-296.80) Assessment: Deteriorated  Discussed case with Dr. Pascal Lux, who remembers the patient. Will restart Lamictal for Depressive-Type Bipolar. Will have the patient follow up in [facility].   Orders: [facility]- Est  Level 4 (22025)  Problem # 5:  TOBACCO USE  (ICD-305.1) Assessment: Deteriorated  Encouraged cessation. Offered free classes.  Orders: [facility]- Est  Level 4 (42706)  Problem # 6:  OBESITY (ICD-278.00) Assessment: Unchanged Discussed healthy diet and exercise for weight loss and management of chronic diseases. Orders: Comp Met-[facility] [id number]) Lipid-[facility] [id number]) TSH-[facility] [id number]) Vit D, 25 OH-[facility] ([id number]) [facility]- Est  Level 4 (82993)  Complete Medication List: 1)  Albuterol 90 Mcg/act Aers (Albuterol) .... Inhale 1 puff every four to six hours 2)  Seroquel 100 Mg Tabs (Quetiapine fumarate) .... One by mouth daily. 3)  Omeprazole 20 Mg Cpdr (Omeprazole) .... One by mouth daily 4)  Lisinopril-hydrochlorothiazide 20-12.5 Mg Tabs (Lisinopril-hydrochlorothiazide) .Marland Kitchen.. 1 tablet by mouth daily 5)  Metformin Hcl 1000 Mg Tabs (Metformin hcl) .... Take 1 tablet by mouth two times a day 6)  Lamictal 25 Mg Tabs (Lamotrigine) .... One by mouth daily x 2 weeks, then 2 by mouth daily x 2 weeks, then 4 by mouth daily - for bipolar  Patient Instructions: 1)  It was nice to see you today. 2)  For your: 3)  Blood Pressure: Lisinopril/HCTZ 4)  Diabetes: Metformin 5)  Bipolar: Seroquel, Lamictal 6)  Please make an appointment to be seen in the [facility]. Prescriptions: LAMICTAL 25 MG TABS (LAMOTRIGINE) one by mouth daily x 2 weeks, then 2 by mouth daily x 2 weeks, then 4 by mouth daily - FOR BIPOLAR  #120 x 0   Entered and Authorized by:   Helane Rima DO   Signed by:   Helane Rima DO on 10/26/2009   Method used:   Electronically to        [facility] [phone]* (retail)       [address]       [place], [place]  [address]       Ph: [phone]       Fax: [phone]   RxID:   [id number] METFORMIN HCL 1000 MG TABS (METFORMIN HCL) Take 1 tablet by mouth two times a day  #180 x 3   Entered and Authorized by:   Helane Rima DO   Signed by:   Helane Rima DO on 10/26/2009   Method used:   Electronically to         [facility] [phone]* (retail)       [address]       [place], [place]  [address]       Ph: [phone]       Fax: [phone]   RxID:   [id number] LISINOPRIL-HYDROCHLOROTHIAZIDE 20-12.5 MG TABS (LISINOPRIL-HYDROCHLOROTHIAZIDE) 1 tablet by mouth daily  #90 x 3   Entered and Authorized by:   Helane Rima DO   Signed by:   Helane Rima DO on 10/26/2009   Method used:   Electronically to        [facility] [phone]* (retail)       [address]       [place], [place]  [address]       Ph: [phone]       Fax: [phone]   RxID:   [id number] SEROQUEL 100 MG  TABS (QUETIAPINE FUMARATE) One by mouth daily.  #30 x 1   Entered and Authorized by:   Helane Rima DO   Signed by:   Helane Rima DO on 10/26/2009   Method used:   Electronically to        [facility] [phone]* (retail)       [address]       [place], [place]  [address]       Ph: [phone]       Fax: [phone]   RxID:   [id number]   Laboratory Results   Blood Tests   Date/Time Received: Oct 26, 2009 10:52 AM  Date/Time Reported: Oct 26, 2009 11:27 AM   HGBA1C: 7.9%   (Normal Range: Non-Diabetic - 3-6%   Control Diabetic - 6-8%)  Comments: ...............test performed by......Marland KitchenBonnie A. [place]az[place], MLS (ASCP)cm      Prevention & Chronic Care Immunizations   Influenza vaccine: Fluvax 3+  (04/29/2007)   Influenza vaccine deferral: Not indicated  (10/26/2009)    Tetanus booster: 03/10/2002: Done.    Pneumococcal vaccine: Not documented  Other Screening   Pap smear: Not documented  Mammogram: Not documented   Mammogram action/deferral: Deferred  (10/26/2009)   Smoking status: current  (10/26/2009)   Smoking cessation counseling: yes  (10/26/2009)  Diabetes Mellitus   HgbA1C: 7.9  (10/26/2009)    Eye exam: Not documented    Foot exam: yes  (10/26/2009)   Foot exam action/deferral: Do today   High risk foot: No  (10/26/2009)   Foot care education: Not  documented    Urine microalbumin/creatinine ratio: Not documented    Diabetes flowsheet reviewed?: Yes   Progress toward A1C goal: Deteriorated  Lipids   Total Cholesterol: 231  (04/29/2007)   LDL: 121  (04/29/2007)   LDL Direct: Not documented   HDL: 67  (04/29/2007)   Triglycerides: 213  (04/29/2007)  Hypertension   Last Blood Pressure: 144 / 95  (10/26/2009)   Serum creatinine: 0.72  (12/30/2007)   Serum potassium 4.3  (12/30/2007) CMP ordered     Hypertension flowsheet reviewed?: Yes   Progress toward BP goal: Deteriorated  Self-Management Support :   Personal Goals (by the next clinic visit) :     Personal A1C goal: 6  (10/26/2009)     Personal blood pressure goal: 130/80  (10/26/2009)     Personal LDL goal: 130  (10/26/2009)    Patient will work on the following items until the next clinic visit to reach self-care goals:     Medications and monitoring: take my medicines every day, bring all of my medications to every visit  (10/26/2009)     Eating: drink diet soda or water instead of juice or soda, eat more vegetables, use fresh or frozen vegetables, eat foods that are low in salt, eat baked foods instead of fried foods, eat fruit for snacks and desserts, limit or avoid alcohol  (10/26/2009)     Activity: take a 30 minute walk every day, take the stairs instead of the elevator, park at the far end of the parking lot  (10/26/2009)    Diabetes self-management support: Written self-care plan  (10/26/2009)   Diabetes care plan printed   Referred.    Hypertension self-management support: Written self-care plan  (10/26/2009)   Hypertension self-care plan printed.   Nursing Instructions: Diabetic foot exam today

## 2010-07-10 NOTE — Progress Notes (Signed)
Summary: Triage  Phone Note Call from Patient Call back at Home Phone [phone]   Caller: Patient Summary of Call: pt asking for pain meds until she can see her pcp.   Initial call taken by: Clydell Hakim,  December 06, 2008 11:26 AM  Follow-up for Phone Call        left message Follow-up by: Golden Circle RN,  December 06, 2008 11:31 AM  Additional Follow-up for Phone Call Additional follow up Details #1::        pt returned call Additional Follow-up by: De Nurse,  December 06, 2008 4:24 PM    Additional Follow-up for Phone Call Additional follow up Details #2::    told her per notes she must be seen here by pcp. she will go to Urgent care  Follow-up by: Golden Circle RN,  December 06, 2008 4:39 PM

## 2010-07-10 NOTE — Progress Notes (Signed)
Summary: Triage  Phone Note Call from Patient Call back at [phone]   Caller: Patient Summary of Call: pt hurting in back and down legs wanted to be seen.  Was seen Monday at ed and was told to follow up with [place]. Initial call taken by: Clydell Hakim,  November 24, 2008 12:07 PM  Follow-up for Phone Call        went to ED for what she thought was a kidney stone. it is not that per pt. she was given fluids & pain meds & nausea. was told it might be a virus. she is out of the pain pills. appt made for tomorrow with [facility] at 3:30. told her if she was in too much pain she can go to Urgent Care today. to use ibu with food until seen Follow-up by: Golden Circle RN,  November 24, 2008 12:23 PM

## 2010-07-10 NOTE — Assessment & Plan Note (Signed)
Summary: f/u visit/bmc   Vital Signs:  Patient profile:   40 year old female Weight:      214 pounds Pulse rate:   92 / minute BP sitting:   130 / 84  (left arm)  Vitals Entered By: Arlyss Repress CMA, (December 13, 2008 3:01 PM) CC: f/up low back pain. radiates into right leg. refill pain meds and albuteral inhaler. tobacco cessation. Is Patient Diabetic? No Pain Assessment Patient in pain? yes     Location: lower back Intensity: 6 Onset of pain  Chronic   Primary Care Provider:  Helane Rima DO  CC:  f/up low back pain. radiates into right leg. refill pain meds and albuteral inhaler. tobacco cessation.Marland Kitchen  History of Present Illness: [name] [name] is a 40 year old female presenting for follow-up of low back pain.  1. Low back pain: x several weeks, has been seen in clinic and [facility] for this problem, low back pain is sharp with radiation down right leg, noticed acutely after wrestling with children, the pain is made better with heating pad, pillow between knees, and vicodin; she stays in bed most of the day due to the pain, has not done any of the prescribed exercises given at the last visit, she is upset that she has been in pain and has been told that she does not need narcotics; she denies bowel or bladder incontinence, trauma, prolonged steroid use, weakness in lower extremities.  2. Bipolar: On Lamictal and Seroquel. Has not been seen in [facility] since 10/09. She does not want to make an appointment to be seen, stating that she is "fine," but she does admit that today is the first day that she has gotten dressed in several days. Denies S/HI.  3. CHART REVIEW: This patient is new to me. A chart review reveals that the patient was treated for chronic daily headaches thought to be secondary to medication overuse; she was known to take her mother's darvocet or her boyfriend's vicodin.   4. Tobacco use: currently smoking, interested in quiting.  Habits & Providers  Alcohol-Tobacco-Diet  Tobacco Status: current     Tobacco Counseling: to quit use of tobacco products  Current Medications (verified): 1)  Albuterol 90 Mcg/act Aers (Albuterol) .... Inhale 1 Puff Every Four To Six Hours 2)  Seroquel 100 Mg  Tabs (Quetiapine Fumarate) .... Take 300 Mg A Day For One Week.  The Next Week Take 200 Mg A Day.  The Next Week Take 100 Mg A Day and Then The Following Week Stop Taking It. 3)  Lamictal 200 Mg Tabs (Lamotrigine) .... Take 1/2 Tablet Per Day (She Is On Depakote) 4)  Hydrocodone-Acetaminophen 5-500 Mg Tabs (Hydrocodone-Acetaminophen) .... One Three Times A Day As Needed For Pain 5)  Promethazine Hcl 25 Mg Tabs (Promethazine Hcl) .... Three Times A Day As Needed Nausea 6)  Omeprazole 20 Mg Cpdr (Omeprazole) .... One By Mouth Daily  Allergies (verified): No Known Drug Allergies  Social History: Smoking Status:  current  Review of Systems       per HPI, otherwise negative  Physical Exam  General:  Timid, flat affect, moved well on and off exam table, NAD.   Detailed Back/Spine Exam  General:    anxious appearing and flat affect.    Gait:    Normal heel-toe gait pattern bilaterally.    Skin:    Intact with no erythema; no scarring.    Inspection:    normal alignment of leg, ankle, hindfoot, and foot.  Palpation:    non-tender to palpation.  Vascular:    2+ dital pulses  Lumbosacral Exam:  Inspection-deformity:    Normal Palpation-spinal tenderness:  Normal Lying Straight Leg Raise:    Right:  negative    Left:  negative Sitting Straight Leg Raise:    Right:  negative    Left:  negative Fabere Test:    Right:  negative    Left:  negative   Impression & Recommendations:  Problem # 1:  BACK PAIN, ACUTE (ICD-724.5) Assessment Unchanged  Still acute episode. Explained natural course of acute low back strain/sprain. Advised previously prescribed back exercises, heating pad, NSAIDs, flexeril (given at [facility]). Refilled Vicodin x 1 month with NO  REFILLS with the instructions that they were to be used to help her to get out of bed and exercise. After reviewing the chart, I see that she has a history of medication dependence/ drug-seeking behavior. This should be taken into consideration if the patient continues to c/o LBP. Consider sending to PT. Her updated medication list for this problem includes:    Hydrocodone-acetaminophen 5-500 Mg Tabs (Hydrocodone-acetaminophen) ..... One three times a day as needed for pain  Orders: [facility]- Est  Level 4 (60454)  Problem # 2:  BIPOLAR AFFECTIVE DISORDER (ICD-296.80)  Continue current regimen. Encouraged patient to follow up with Dr. Kathrynn Running in [facility].   Orders: [facility]- Est  Level 4 (09811)  Problem # 3:  TOBACCO USE (ICD-305.1) Assessment: Unchanged  Patient interested in smoking cessation. Discussed options. Plan: to initiated Chantix after acute low back pain resolved (off Vicodin). This will be more complicated in a patient with Bipolar-on Lamictal and Seroquel, and living alone so will need closer follow up. Will likely send to pharmacy clinic for this.  Orders: [facility]- Est  Level 4 (99214)  Complete Medication List: 1)  Albuterol 90 Mcg/act Aers (Albuterol) .... Inhale 1 puff every four to six hours 2)  Seroquel 100 Mg Tabs (Quetiapine fumarate) .... Take 300 mg a day for one week.  the next week take 200 mg a day.  the next week take 100 mg a day and then the following week stop taking it. 3)  Lamictal 200 Mg Tabs (Lamotrigine) .... Take 1/2 tablet per day (she is on depakote) 4)  Hydrocodone-acetaminophen 5-500 Mg Tabs (Hydrocodone-acetaminophen) .... One three times a day as needed for pain 5)  Promethazine Hcl 25 Mg Tabs (Promethazine hcl) .... Three times a day as needed nausea 6)  Omeprazole 20 Mg Cpdr (Omeprazole) .... One by mouth daily  Patient Instructions: 1)  It was very nice to meet you today! 2)  Make sure to do the exercise in the handout that you were given at the last visit.    3)  We will recheck your pain in 1 month. Prescriptions: HYDROCODONE-ACETAMINOPHEN 5-500 MG TABS (HYDROCODONE-ACETAMINOPHEN) one three times a day as needed for pain Brand medically necessary #30 x 0   Entered and Authorized by:   Helane Rima MD   Signed by:   Helane Rima MD on 12/13/2008   Method used:   Print then Give to Patient   RxID:   [id number] ALBUTEROL 90 MCG/ACT AERS (ALBUTEROL) Inhale 1 puff every four to six hours  #1 x 3   Entered and Authorized by:   Helane Rima MD   Signed by:   Helane Rima MD on 12/13/2008   Method used:   Electronically to        [facility] [phone]* (  retail)       [address]       [place], [place]  [address]       Ph: [phone]       Fax: [phone]   RxID:   [id number] OMEPRAZOLE 20 MG CPDR (OMEPRAZOLE) one by mouth daily  #90 x 3   Entered and Authorized by:   Helane Rima MD   Signed by:   Helane Rima MD on 12/13/2008   Method used:   Electronically to        [facility] [phone]* (retail)       [address]       [place], [place]  [address]       Ph: [phone]       Fax: [phone]   RxID:   [id number]

## 2010-07-10 NOTE — Miscellaneous (Signed)
Summary: still feeling  bad  Clinical Lists Changes states she still feels really bad. did not keep appt here & did not go to ED. has difficulty finding someone to bring her & cannot ride a bus per pt. no SOB today. swelling is the same. she is not a diabetic but is very concerned that UC got a cbg of over 200. states she is going to ed. told her she will need a f/u appt here to investigate the cbg & f/u on swelling. asked her to call us & make appt after she goes to ED.Golden Circle RN  Oct 17, 2009 2:46 PM

## 2010-07-10 NOTE — Progress Notes (Signed)
Summary: Narcotics Requests Clarification    It looks like this patient requests and refilled Vicodin 5/500 #30 on 12/05/08 after getting the same Rx #30 on 11/25/08. She called the after-hours line and requested Vicodin on 12/05/08 pm. Please clarify how many pills this patient has received this month. Thanks!  Appended Document: Narcotics Requests Clarification pt has appt 12-13-08. need to find out if pt uses more than one pharmacy.Marland KitchenMarland Kitchen

## 2010-07-10 NOTE — Progress Notes (Signed)
Summary: phone note:   Fingers swelling this morning, knuckles doubled the size, can't get rings on, hard to straighten hands out this morning for about 1-2 hours, knuckes enlarged and painful, fingertips tingling both sides, Left hand worse tingling, she is Rt handed, can move them both but hurts. Hasn't done anything out of the ordinary that could be causing this pain. Sounds like it may be OA. Advised pt to use Ibuprofen. She has some 500 mg tablets at home. Verified that this was Ibuprofen and patient says it is. Told her to use it ever 5 hours for the pain in the joints. She was short of breath this morning and some this afternoon and this mild hypoxia may be causing the tingling in her fingers. Pt is not SOB now. She will try toe Ibuprofen and call on Monday if she needs an appointment. Also I will send this to Admin and they will offer her an appointment if needed.   Appended Document: phone note: LM for her to call back. will offer appt when she does

## 2010-07-10 NOTE — Progress Notes (Signed)
Summary: Rx Req  Phone Note Call from Patient Call back at [phone]   Caller: Patient Summary of Call: pt asking for rx on hyrocodone.  pt asking for it to be called [facility] to [facility] [phone] Initial call taken by: Clydell Hakim,  December 23, 2008 12:01 PM  Follow-up for Phone Call        will send message to MD. Follow-up by: Theresia Lo RN,  December 23, 2008 12:14 PM  Additional Follow-up for Phone Call Additional follow up Details #1::        I told the patient that she was to get no more refills for this episode of low back pain. She may take Ibuprofen or Tylenol for pain. If her pain is not getting better, I will send her to physical therapy. If there has been some change or her pain is worse, she may need to go to the [facility] at this point. Additional Follow-up by: Helane Rima MD,  December 23, 2008 4:39 PM     Appended Document: Rx Req Called back and no answer and no answering machine.  If pt. calls back, please read message from MD.

## 2010-07-10 NOTE — Letter (Signed)
Summary: Results Letter  [facility]  [address]   [place], [place] [address]   Phone: [phone]  Fax: [phone]    10/27/2009  [name] [address] [place], [place]  [address]  Dear Ms. Dossantos,  It was nice to see you the other day. I reviewed your labs. Most of them were normal. The were three labs that needed to be addressed:  1. Your vitamin D level is low. Vitamin D is very important for bone health, but has also been found to be associated with chronic pain. I have prescribed a high-dose vitamin D supplement for you to take once a week for 12 weeks to replete your vitamin D stores.  2. Your triglycerides (a measurement of fat and sugar in your body) was high. I am referring you to a Nutrition Class that will help you learn how to control this through a healthy diet. We will recheck this number in 3 months. I do not want to add a medication yet since we don't know what is causing your hand and foot numbness.  3. Your A1c (a 3 months measurement of your blood sugars) was 7.9. This can definitely be controlled if you take the Metformin every day as directed, start exercising, and follow a diabetic diet. I am referring you to a Diabetes Nutrition Class (see above). We will recheck your A1c in 3 months to see if it improves with our interventions.  Please don't hesitate to call with questions or concerns.  Sincerely,   [name] Rima DO   Appended Document: Results Letter mailed.

## 2010-07-12 NOTE — Miscellaneous (Addendum)
Summary: Behavioral Health Admit Alert  Clinical Lists Changes  Per protocol with [facility], I was alerted today that this patient was admitted to [facility].  Admission was reportedly done on 06/27/10.  A follow-up visit should be scheduled with the patient and her PCP, Dr. Earlene Plater at the earliest convenience.  A guideline for how this visit can be conducted is posted on the WIKI.  Search "Post-hospitalization cheat sheet."    I will ask Britta Mccreedy in the front office to schedule the appt and will notify Dr. Earlene Plater of the proposed structure for this visit.

## 2010-08-20 LAB — CBC
HCT: 39.7 % (ref 36.0–46.0)
Hemoglobin: 13.7 g/dL (ref 12.0–15.0)
MCH: 30.4 pg (ref 26.0–34.0)
MCHC: 34.5 g/dL (ref 30.0–36.0)

## 2010-08-20 LAB — GLUCOSE, CAPILLARY
Glucose-Capillary: 127 mg/dL — ABNORMAL HIGH (ref 70–99)
Glucose-Capillary: 141 mg/dL — ABNORMAL HIGH (ref 70–99)
Glucose-Capillary: 162 mg/dL — ABNORMAL HIGH (ref 70–99)
Glucose-Capillary: 164 mg/dL — ABNORMAL HIGH (ref 70–99)
Glucose-Capillary: 165 mg/dL — ABNORMAL HIGH (ref 70–99)
Glucose-Capillary: 167 mg/dL — ABNORMAL HIGH (ref 70–99)
Glucose-Capillary: 176 mg/dL — ABNORMAL HIGH (ref 70–99)
Glucose-Capillary: 180 mg/dL — ABNORMAL HIGH (ref 70–99)
Glucose-Capillary: 183 mg/dL — ABNORMAL HIGH (ref 70–99)
Glucose-Capillary: 190 mg/dL — ABNORMAL HIGH (ref 70–99)
Glucose-Capillary: 227 mg/dL — ABNORMAL HIGH (ref 70–99)
Glucose-Capillary: 194 mg/dL — ABNORMAL HIGH (ref 70–99)
Glucose-Capillary: 273 mg/dL — ABNORMAL HIGH (ref 70–99)

## 2010-08-20 LAB — URINALYSIS, ROUTINE W REFLEX MICROSCOPIC
Bilirubin Urine: NEGATIVE
Glucose, UA: 250 mg/dL — AB
Leukocytes, UA: NEGATIVE
Nitrite: NEGATIVE
Specific Gravity, Urine: 1.002 — ABNORMAL LOW (ref 1.005–1.030)
pH: 6 (ref 5.0–8.0)

## 2010-08-20 LAB — RAPID URINE DRUG SCREEN, HOSP PERFORMED: Barbiturates: NOT DETECTED

## 2010-08-20 LAB — TRICYCLICS SCREEN, URINE: TCA Scrn: NOT DETECTED

## 2010-08-20 LAB — DIFFERENTIAL
Lymphocytes Relative: 18 % (ref 12–46)
Lymphs Abs: 1.6 10*3/uL (ref 0.7–4.0)
Neutro Abs: 6.3 10*3/uL (ref 1.7–7.7)
Neutrophils Relative %: 74 % (ref 43–77)

## 2010-08-20 LAB — COMPREHENSIVE METABOLIC PANEL
CO2: 23 mEq/L (ref 19–32)
Calcium: 9.3 mg/dL (ref 8.4–10.5)
Creatinine, Ser: 0.72 mg/dL (ref 0.4–1.2)
GFR calc non Af Amer: >60 mL/min (ref >60)
Glucose, Bld: 268 mg/dL — ABNORMAL HIGH (ref 70–99)

## 2010-08-20 LAB — URINE MICROSCOPIC-ADD ON

## 2010-08-20 LAB — SALICYLATE LEVEL: Salicylate Lvl: <4 mg/dL (ref 2.8–20.0)

## 2010-08-20 LAB — POCT PREGNANCY, URINE: Preg Test, Ur: NEGATIVE

## 2010-08-20 LAB — ACETAMINOPHEN LEVEL: Acetaminophen (Tylenol), Serum: <10 ug/mL — ABNORMAL LOW (ref 10–30)

## 2010-08-20 LAB — VALPROIC ACID LEVEL: Valproic Acid Lvl: 42.4 ug/mL — ABNORMAL LOW (ref 50.0–100.0)

## 2010-08-28 LAB — URINALYSIS, ROUTINE W REFLEX MICROSCOPIC
Bilirubin Urine: NEGATIVE
Glucose, UA: NEGATIVE mg/dL
Ketones, ur: NEGATIVE mg/dL
Protein, ur: NEGATIVE mg/dL

## 2010-08-28 LAB — POCT I-STAT, CHEM 8
Calcium, Ion: 1.13 mmol/L (ref 1.12–1.32)
Chloride: 104 mEq/L (ref 96–112)
HCT: 44 % (ref 36.0–46.0)
TCO2: 23 mmol/L (ref 0–100)

## 2010-08-28 LAB — COMPREHENSIVE METABOLIC PANEL
Albumin: 4.5 g/dL (ref 3.5–5.2)
Alkaline Phosphatase: 78 U/L (ref 39–117)
BUN: 7 mg/dL (ref 6–23)
CO2: 26 mEq/L (ref 19–32)
Chloride: 99 mEq/L (ref 96–112)
GFR calc non Af Amer: >60 mL/min (ref >60)
Glucose, Bld: 197 mg/dL — ABNORMAL HIGH (ref 70–99)
Potassium: 3 mEq/L — ABNORMAL LOW (ref 3.5–5.1)
Total Bilirubin: 0.3 mg/dL (ref 0.3–1.2)

## 2010-08-28 LAB — CBC
HCT: 41.9 % (ref 36.0–46.0)
Hemoglobin: 14.1 g/dL (ref 12.0–15.0)
Platelets: 215 10*3/uL (ref 150–400)
RBC: 4.73 MIL/uL (ref 3.87–5.11)
WBC: 10 10*3/uL (ref 4.0–10.5)

## 2010-08-28 LAB — DIFFERENTIAL
Basophils Absolute: 0 10*3/uL (ref 0.0–0.1)
Basophils Relative: 0 % (ref 0–1)
Monocytes Absolute: 0.6 10*3/uL (ref 0.1–1.0)
Neutro Abs: 6.4 10*3/uL (ref 1.7–7.7)

## 2010-08-28 LAB — URINE MICROSCOPIC-ADD ON

## 2010-08-28 LAB — GLUCOSE, CAPILLARY: Glucose-Capillary: 164 mg/dL — ABNORMAL HIGH (ref 70–99)

## 2010-09-17 LAB — URINALYSIS, ROUTINE W REFLEX MICROSCOPIC
Bilirubin Urine: NEGATIVE
Glucose, UA: NEGATIVE mg/dL
Ketones, ur: NEGATIVE mg/dL
pH: 5.5 (ref 5.0–8.0)

## 2010-09-17 LAB — COMPREHENSIVE METABOLIC PANEL
Alkaline Phosphatase: 82 U/L (ref 39–117)
BUN: 9 mg/dL (ref 6–23)
Calcium: 8.9 mg/dL (ref 8.4–10.5)
Glucose, Bld: 143 mg/dL — ABNORMAL HIGH (ref 70–99)
Total Protein: 7.1 g/dL (ref 6.0–8.3)

## 2010-09-17 LAB — GC/CHLAMYDIA PROBE AMP, GENITAL: GC Probe Amp, Genital: NEGATIVE

## 2010-09-17 LAB — WET PREP, GENITAL
Trich, Wet Prep: NONE SEEN
Yeast Wet Prep HPF POC: NONE SEEN

## 2010-09-17 LAB — DIFFERENTIAL
Basophils Relative: 1 % (ref 0–1)
Lymphs Abs: 3.9 10*3/uL (ref 0.7–4.0)
Monocytes Relative: 6 % (ref 3–12)
Neutro Abs: 6.6 10*3/uL (ref 1.7–7.7)
Neutrophils Relative %: 58 % (ref 43–77)

## 2010-09-17 LAB — CBC
HCT: 41.1 % (ref 36.0–46.0)
Hemoglobin: 14.3 g/dL (ref 12.0–15.0)
MCHC: 34.7 g/dL (ref 30.0–36.0)
RDW: 12.4 % (ref 11.5–15.5)

## 2010-09-17 LAB — LIPASE, BLOOD: Lipase: 15 U/L (ref 11–59)

## 2010-09-17 LAB — POCT PREGNANCY, URINE: Preg Test, Ur: NEGATIVE

## 2010-10-26 NOTE — Discharge Summary (Signed)
[facility]  Patient:    [name] [name], [name] [name]                  MRN: [id number] Adm. Date:  [id number] Disc. Date: [id number] Attending:  Milford Cage H                           Discharge Summary  PATIENT IDENTIFICATION:  [name] was a 40 year old female.  HISTORY OF PRESENT ILLNESS:  [name] had been referred from the [facility] urology unit after being treated for kidney stones.  She had a history of bipolar disorder and panic attacks.  While in the hospital, she reported worsening of her depressive symptoms with the onset of suicidal ideation.  She believes she would not be safe if she had to return home, and she had plans to kill herself by drowning in the bathtub or after cutting her wrists.  She had been distressed recently because of her husband, whom she was still attached to, had informed her that he was dating another woman.  She said she felt jealous.  MENTAL STATUS EXAMINATION:  At the time of the initial evaluation, revealed a quiet, but cooperative woman with poor eye contact.  The mood was depressed. The affect was sad.  She was positive for suicidal ideation.  There were no homicidal thoughts.  There was no evidence of any psychotic thinking.  Short and long-term memory were intact.  She seemed to be, at least, average intelligence.  Concentration was somewhat diminished.  Insight was fair. Judgement was poor.  Other pertinent history can be obtained from the social service summary.  The physical examination was noncontributory to the current situation.  ADMITTING DIAGNOSIS: Axis I:    Bipolar disorder, depressed. Axis II:   Deferred. Axis III:  1. Kidney stones.            2. Headaches.            3. Asthma. Axis IV:   Severe. Axis V:    10.  FINDINGS:  All indicated laboratory examinations were within normal limits or noncontributory.  Her lithium level, on May 1, was 0.5.  HOSPITAL COURSE:  While in the hospital, [name] remained depressed throughout her stay.  She focused her relationship with her husband.  He and she had already been separated apparently.  While she was in the hospital, she reported that the girlfriend had moved into the house with him to help take care of the children.  From her standpoint, that made the situation even worse.  The husband brought the girlfriend with him when he came to the hospital with the children.  Nevertheless, it was clear that, from his standpoint, the marriage would not be resumed, and [name] accepted that, even though she had a hard time doing so.  Consequently, her next efforts were to find a place to stay and to begin working out her own life currently.  She said she still loved her husband and she felt she was losing her best friend. By the time of discharge, she was, at least, somewhat more optimistic about herself.  She was not having suicidal thoughts.  She believed that things would work out, though she recognizes she was going to be grieving and depressed for some time.  POST HOSPITAL CARE PLAN:  She is referred to the [facility], with an appointment for May 9, with [facility].  DISCHARGE MEDICATIONS:  At the time of discharge, she was taking the following medications: 1. Lithium carbonate 300 mg twice daily and 900 mg at bedtime. 2. Prozac 60 mg in the morning. 3. Lorazepam 1 mg 3 x daily. 4. Ambien 10 mg at bedtime, as needed.  HOME MEDICATIONS: 1. Pepcid 20 mg twice a day. 2. Albuterol inhaler 2 puffs q.5h. 3. Darvocet-N 100 one or two tablets as needed q.6h. for headaches. 4. Phenergan 12.5 mg to 25 mg q.4h. as needed.  There were no restrictions placed on her activity or her diet, other than those secondary to the kidney stone surgery.  She will follow up with the urologist for that particular problem.  FINAL DIAGNOSES: Axis I:    Bipolar disorder, depressed. Axis II:   No diagnosis. Axis III:  1.  Kidney stones.            2. Headaches.            3. Asthma. Axis IV:   Severe. Axis V:    50. DD:  10/17/99 TD:  10/19/99 Job: [id number]

## 2010-10-26 NOTE — H&P (Signed)
[name] [name], [name] [name]               ACCOUNT NO.:  [id number]   MEDICAL RECORD NO.:  [id number]          PATIENT TYPE:  IPS   LOCATION:  0404                          FACILITY:  [facility]   PHYSICIAN:  Jasmine Pang, M.D. DATE OF BIRTH:  Nov 23, 1970   DATE OF ADMISSION:  09/05/2006  DATE OF DISCHARGE:                       PSYCHIATRIC ADMISSION ASSESSMENT   IDENTIFYING INFORMATION:  This is an involuntary admission to the  services of Dr. Milford [name].  This is a 40 year old divorced white  female.  According to the commitment papers, the respondent has a  history for bipolar disorder but dropped out of treatment in 2002.  She  is currently only on Topamax for migraines.  Apparently, she had not  slept for the past 10 days.  She was paranoid, frightening her children,  saying that there is evil in the house.  She is currently staying with  her ex-husband after breaking up with her boyfriend of four years.  She  was noted to be disorganized, having flight of ideas and regresses to a  childlike state.  She states that her ex-husband needs to die  although, in her past history, she attempted suicide by jumping.  She  could not survive in the community and is a severe bipolar, mixed state  with psychosis.  She also was thinking she was pregnant but has had her  tubes tied and has had several pregnancy tests in the past few days at  the [facility] ED that have all been negative.   PAST PSYCHIATRIC HISTORY:  Unfortunately, the patient is not able to  really cooperate with the exam at this time.  It is indicated in the  record that she has had no treatment since 2002.  She has not been here  at the [facility] since 2001.   SOCIAL HISTORY:  She went to the 10th grade.  She has been married once.  She has a 65 year old son, a 26 year old son and an 51 year old daughter  and a 75-year-old son.  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   FAMILY HISTORY:  She states her  mother was bipolar and the paternal side  of her family is depressed.   ALCOHOL/DRUG HISTORY:  She denies.   PRIMARY CARE PHYSICIAN:  [facility].   MEDICAL PROBLEMS:  She is status post tubal ligation four years ago.   LABORATORY DATA:  She does have a trace of leukocytes in her urine.  Her  glucose is elevated at 131.  Her urine pregnancy test is still negative.   PHYSICAL EXAMINATION:  Her physical examination was done in the ED.  She  was medically cleared.  Her vital signs on admission to our unit, shows  she is 62-1/2 inches tall, she weighs 190 pounds, temperature 96.6,  blood pressure 132/87, pulse 84, respirations 18.  She does have a scar  on her left knee.  She is status post some type of knee surgery.   MENTAL STATUS EXAM:  She is drowsy but rousable.  She has recently  showered, so she is appropriately groomed, dressed and appears  adequately nourished.  Her speech is not pressured.  Her mood is labile.  Her thought processes are disorganized.  She has flight of ideas.  Concentration and memory are poor.  She is easily distracted.  Judgment  and insight are poor.  Intelligence is at least average.  She denies  being suicidal or homicidal.  There are no frank auditory or visual  hallucinations but she still feels that there is evil in the house.   DIAGNOSES:  AXIS I:  Bipolar mixed.  AXIS II:  She reports learning differences resulting in partially for  her being rendered disabled.  AXIS III:  Obese, status post left knee surgery, status post tubal  ligation.  AXIS IV:  Problems with primary support group.  AXIS V:  30.   PLAN:  To admit for stabilization.  Toward that end, she was started on  Risperdal M-Tab 2 mg.  We will treat her current UTI and we will assess  her increased blood sugar level.      [name], P.A.-C.      Jasmine Pang, M.D.  Electronically Signed    MD/MEDQ  D:  09/06/2006  T:  09/06/2006  Job:  [id number]

## 2010-10-26 NOTE — Discharge Summary (Signed)
Welcome. [facility]  Patient:    [name], [name]                MRN: [id number] Adm. Date:  [id number] Disc. Date: [id number] Attending:  Evlyn Clines CC:         Huey Bienenstock McDiarmid, M.D.             Adelene Amas. Williford, M.D.             Kinnie Scales. Reed Breech, M.D.             Excell Seltzer. Annabell Howells, M.D.                           Discharge Summary  HISTORY OF PRESENT ILLNESS:  [name] [name] is a 40 year old white female who presented with a one-day history of sharp, right lower quadrant pain with radiation to the right flank and associated with nausea.  PAST MEDICAL HISTORY:  Significant for bipolar disorder, tension headaches, asthma, and obesity.  ADMISSION MEDICATIONS: 1. Darvocet-N 100. 2. Ventolin two puffs t.i.d. 3. Lithium three pills p.o. q.d. 4. Prozac 20 mg p.o. q.d. 5. Ativan 0.5 mg p.o. q.i.d.  For additional details of the history and physical, please see the dictated note.  ACCESSORY CLINICAL INFORMATION:  White count 17.4, hemoglobin 13.7 on admission.  Chemistries were within normal limits with the exception of a calcium of 8.3.  Pregnancy test negative.  ______ level from April 9 less than 0.25.  Urinalysis had 21 to 50 white cells, 21 to 50 red cells, few bacteria.  Blood cultures were no growth.  Urine culture on April 11 was no growth.  Vaginal smear was negative.  GC and Chlamydia probe was negative.  A CT of the abdomen revealed a 6 to 7 mm obstructing distal right ureteral calculus.  HOSPITAL COURSE:  The patient was initially admitted to Dr. McDiarmid on the family practice service.  Dr. Annabell Howells was then consulted and saw the patient on September 18, 1999.  At that time, her T max was 100.4 and it was felt that she required urgent cystoscopy and stenting.  That was performed on April 10 without complications.  She was left with a 6 [place], 24 cm stent, and was initially scheduled for lithotripsy.   On April 11, she had a T max of  102.2. She had been placed on Cipro 400 mg IV q.12h. at admission.  Unasyn was added on April 11 to broaden the coverage.  She had been on some Toradol.  That was stopped because of impending ESL and morphine PSA was removed.  Bleeding time, PT and PTT were obtained on April 11.  She was transferred from the family practice service to the urology service on April 11.  On September 20, 1999, she was still complaining of pain but was better.  T max was 104.1.  She had right CVA tenderness.  Her lip was swollen.  She had been biting it secondary to anxiety.  Her hemoglobin was down to 9.8 from hydration.  Platelet count was down to 127.  Her white count was normal at 9.2.  Her PT was noted to be up at 17.5 potassium low at 3.2, and glucose high at 158.  ESWL was cancelled because of coagulopathy and was continued on IV antibiotics.  On April 12, during evaluation by the case management social worker, she admitted suicidal ideations.  Because of this, suicide watch was instituted  and Dr. Jeanie Sewer was consulted.  Vioxx was added for additional pain management as well as Pyridium on April 12.  On April 13, her T max was down to 102.8, was improved, and culture was negative.  On a repeat hemoglobin, it was up to 10.3, white count down to 7.2, platelet count up to 177, PT down to 16, PTT down to 43. She was continued on the Unasyn and Cipro.  Pyridium was switched to Urised. A consult with psychiatry, Dr. Jeanie Sewer, was obtained on April 13. Adjustments in her lithium dose were made and it was recommended that she be transferred to [facility] when that was available.  On April 14, her T max was 100.2.  On April 15, she was afebrile.  She was changed to p.o. Cipro. On April 16, her flank pain had improved.  She was still having some bladder irritation.  Her PT was down to 15.5, her APT was 36.  On April 17, she continued to have some right flank pain.  She was afebrile.  She was continued on  antibiotics.  She was on the schedule for lithotripsy.  On April 18, there were no complaints.  She was afebrile.  Labs were ordered.  On April 19, her PTT was up to 47 and PT was 14.9.  In light of the persistent coagulopathy, lithotripsy was cancelled and decision was made to perform ureteroscopy.  That was done later that day with a right ureteroscopic stone extraction being performed.  A 6 French, 24 cm stent was inserted with string.  Later that day, she was transferred to the behavioral health unit at [facility] with a final diagnoses of urosepsis, obstructing right ureteral stone, bipolar disorder with suicidal ideation, asthma, obesity, tobacco abuse, and coagulopathy.  DISCHARGE MEDICATIONS:  Include p.o. Cipro, Prozac, lithium, and Ventolin.  COMPLICATIONS:  Her admission was complicated by suicidal ideation with her bipolar disorder, persistent coagulopathy.  CONDITION ON DISCHARGE:  Improved.  DISPOSITION:  To the behavioral health unit.  PROGNOSIS:  Good. DD:  10/09/99 TD:  10/10/99 Job: [id number]

## 2010-10-26 NOTE — Discharge Summary (Signed)
[place]. [facility]  Patient:    [name] [name], [name] [name]                MRN: [id number] Adm. Date:  [id number] Disc. Date: [id number] Attending:  Sanjuana Letters                           Discharge Summary  DATE OF BIRTH:  01/17/1971  DISCHARGE DIAGNOSES: 1. Pneumonia. 2. Left hip pain.  PROBLEM LIST OF CHRONIC ISSUES: 1. Bipolar disorder. 2. Depression. 3. Asthma.  DISCHARGE MEDICATIONS: 1. Azithromycin 500 mg p.o. x 1 day, then 250 mg p.o. q.d. x 11 days. 2. Metronidazole 500 mg p.o. b.i.d. x 7 days. 3. Promethazine 25 mg one-half tablet q.4-6h. p.r.n. 4. Lorazepam 0.5 mg p.o. q.i.d. 5. Topamax 25 mg p.o. 6. Lithium 300 mg p.o. t.i.d. 7. Trazodone 50 mg 1-2 tablets p.o. q.h.s. 8. Risperdal 25 mg p.o. q.h.s.  BRIEF HISTORY OF PRESENT ILLNESS:  Please see admission History and Physical for full information.  This is a 40 year old white female with bipolar disorder, depression, asthma, who presents with a 4 to 5 day history of a cough which is productive of yellow sputum and a fever on report of 104 degrees Fahrenheit, in the clinic post Motrin - fever of 101.6, at the time of admission - afebrile. Patient also has a one-day history of left hip pain radiating to the knee.  Pain is not  exacerbated by movement, no dysuria.  Patient complains of a white vaginal discharge.  Patient was recently hospitalized at [facility] for about 10 days.   HOSPITAL COURSE:  Upon admission, the patient was given a chest x-ray and left lower lobe infiltrate was noted.  Patients sputum grew out gram-negative cocci.  She was given azithromycin and Rocephin IV for two days, and that was changed to p.o. for the last 12 days of treatment, which she will be taking at home.  The patients left hip pain - the hip x-rays were negative.  If the pain continues after discharge, the patient will follow up with her family practice physician.  Psychiatrically, the  patient reports still feeling depressed.  Will follow up with Dr. Charm Barges, her psychiatrist, in four days.  On physical exam upon admission, the patient was noted to have bacterial vaginosis and was given a seven-day course of Flagyl for the treatment of her bacterial vaginosis.  ACTIVITY:  The patient will have no restrictions in her activity post discharge.  DIET:  No restrictions.  WOUND CARE:  Not applicable.  SPECIAL INSTRUCTIONS:  She will follow up with Dr. Charm Barges at [facility] on March 12 at 10 a.m.  She will follow up with Dr. [name] at [facility] on Monday, March 19 at 3:45 p.m. DD:  08/16/99 TD:  08/17/99 Job: [id number]

## 2010-10-26 NOTE — Discharge Summary (Signed)
NAMEARNECIA, ECTOR NO.:  [id number]   MEDICAL RECORD NO.:  [id number]          PATIENT TYPE:  IPS   LOCATION:  0406                          FACILITY:  [facility]   PHYSICIAN:  Jasmine Pang, M.D. DATE OF BIRTH:  1970-09-12   DATE OF ADMISSION:  09/05/2006  DATE OF DISCHARGE:  09/15/2006                               DISCHARGE SUMMARY   CONTINUATION:   HOSPITAL COURSE:  Upon first meeting the patient on September 08, 2006, she  discussed her reason for admission but was somewhat resistant to going  into detail.  Thought processes were circumstantial and psychomotor  activity increased.  Thoughts were disorganized so she tried to  cooperate as well as she could.  She was pleasant but very distractible  with poor concentration.  She discussed living with her ex-husband and  four children.  She states she does not get along well with her ex very  well and wants to live elsewhere.  However, she has no resources and may  have to live in a shelter.  Risperdal was increased 1 mg t.i.d. and 2 mg  at h.s., Depakote ER 500 mg p.o. b.i.d. was started.  On September 09, 2006,  the patient's sleep was poor.  She was having difficulty falling asleep  and middle of the night awakening.  Mood was anxious.  She did not know  where she will go live when she leaves.  She was very worried about her  children.  Thinking was still somewhat disorganized and circumstantial.  She was distracted as we talked.  She still thinks she is pregnant.  She  is unable to be talked out of this.  She wonders if she go back to her  ex-husband's house to live.  Medications were continued.  An a.m.  Depakote level was ordered which was 47.5 (50-100).  On September 10, 2006,  the patient was calmer.  Speech was normal rate and flow.  Mood was  anxious about disposition.  She does not want to go to assisted-living.  Sleep was good.  Appetite good.  Affect wider range.  There was no  suicidal or homicidal ideation.   She refused Depakote saying it made her  feel bad.  She still thinks she may be pregnant and wants another  pregnancy test.  No auditory or visual hallucinations.  Urine pregnancy  test was obtained which was negative.   On September 11, 2006, sleep was good.  Appetite was good.  Mood dysphoric  but improving.  She was less circumstantial.  Speech normal rate and  flow.  She was still delusional, thinking that she was pregnant.  There  was no suicidal or homicidal ideation.  On September 12, 2006, the patient  states she is very anxious.  She is worried about where she will live.  She may go back to her fiance versus going to a shelter.  Sleep was  good.  Appetite good.  There was no suicidal or homicidal ideation.  No  auditory hallucinations.  She did admit to thinking she saw bugs  crawling on the walls.  She still believes she is pregnant.  Side  effects were dry mouth and blurred vision.  Cogentin was changed to 1 mg  in the morning and 3 mg at h.s.  The patient had stopped taking Depakote  so this was discontinued.  On September 13, 2006 and September 14, 2006, the  patient continued to be delusional about being pregnant.  She denied  depression or suicidal ideation.  No auditory or visual hallucinations.  It was felt she was safe for discharge tomorrow and that the thought of  pregnancy was a more fixed delusion.   On September 15, 2006, mental status had improved markedly from admission  status.  The patient was friendly, cooperative, conversant.  She had  good eye contact.  Speech was normal rate and flow.  Psychomotor  activity was within normal limits.  Mood was euthymic.  Affect wide  range.  There was no suicidal or homicidal ideation.  No thoughts of  self-injurious behavior.  No auditory or visual hallucinations.  The  patient still thought she was pregnant but no other delusions or  paranoia.  Thoughts were logical and goal-directed.  Thought content no  predominant theme.  Cognitive was grossly  back to baseline.  It was felt  the patient was safe to be discharged today.   DISCHARGE DIAGNOSES:  AXIS I:  Bipolar disorder, mixed, severe with  psychosis.  AXIS II:  None.  AXIS III:  Obesity, status post left knee surgery, status post tubal  ligation.  AXIS IV:  Problems with primary support group, burden of psychiatric  illness (moderate).  AXIS V:  GAF upon discharge 50; GAF upon admission 30; GAF highest past  year 65.   ACTIVITY/DIET:  There were no specific activity level or dietary  restrictions.   DISCHARGE MEDICATIONS:  1. Cogentin 1 mg twice daily.  2. Advair 500/5 mg Diskus inhaler 1 puff twice daily.  3. Ambien 10 mg at bedtime.  4. Risperdal M-Tab 1 mg daily in a.m. and 3 mg at bedtime.  5. Ventolin inhaler 90 mcg, 1 puff every four hours as needed for      shortness of breath.   POST-HOSPITAL CARE PLANS:  The patient will return to the [facility] on Tuesday, September 16, 2006 at 12:15 p.m.  The  patient will also see Harriet Masson at the [facility] for therapy.      Jasmine Pang, M.D.  Electronically Signed     BHS/MEDQ  D:  09/15/2006  T:  09/15/2006  Job:  [id number]

## 2010-10-26 NOTE — H&P (Signed)
[place]. [facility]  Patient:    [name] [name], [name] [name]                MRN: [id number] Adm. Date:  [id number] Attending:  McDiarmid, Leighton Roach. Dictator:   Patricia Pesa, M.D.                         History and Physical  CHIEF COMPLAINT: Abdominal pain and vomiting.  HISTORY OF PRESENT ILLNESS: The patient is a 40 year old female who presents with a one day history of sharp, constant right lower quadrant abdominal pain with radiation to the right flank and nausea, vomiting, and decreased p.o. intake. he describes the pain as 10/10.  No exacerbating factors.  The pain is improved by  lying on her right side.  She denies fever or chills.  No dysuria, no polyuria, no recent infection.  No epigastric pain.  No history of nephrolithiasis.  REVIEW OF SYSTEMS: No weight loss, no chest pain or shortness of breath.  She does have occasional heartburn.  Her last bowel movement was two days ago.  No dizziness.  She did have a headache earlier today.  She has a depressed mood, denies suicidal ideation.  No joint pain.  PAST MEDICAL HISTORY:  1. Bipolar disorder.  2. Tension headaches.  3. Asthma.  4. Obesity.  MEDICATIONS:  1. Darvocet-N 100 (#60 per month) for headache.  2. Ventolin 2 puffs t.i.d.  3. Lithium 3 pills p.o. q.d.  4. Prozac 20 mg p.o. q.d.  5. Ativan 0.5 mg p.o. q.i.d.  ALLERGIES: No known drug allergies.  SOCIAL HISTORY: The patient is separated from her husband.  She has four children.  FAMILY HISTORY: Sister with nephrolithiasis.  PHYSICAL EXAMINATION:  VITAL SIGNS: Temperature 100.1 degrees, blood pressure 132/75, respiratory rate 16. Oxygen saturation 98%.  Pulse 61.  GENERAL: She is in no acute distress.  She did have IV morphine before my examination.  The patient is alert and oriented x 3.  HEENT: PERRL.  EOMI.  Mucous membranes dry.  Pharynx without exudate.  NECK: Supple without lymphadenopathy or thyromegaly.  LUNGS:  Diffuse mild wheezing.  No respiratory distress.  CARDIOVASCULAR: S1 and S2, regular rate and rhythm; no murmurs, rubs, or gallops.  ABDOMEN: She has mild tenderness in the right lower quadrant.  No masses palpated. Mild right CVA tenderness.  No hepatosplenomegaly.  Bowel sounds heard.  Per EDP the patient had genital warts on examination.  NEUROLOGIC: Cranial nerves 2-12 intact.  LABORATORY DATA: WBC 17.4, hemoglobin 13.7, hematocrit 34.3, platelets 204,000; 85% neutrophils.  Urinalysis showed specific gravity of 1.040, large amount hemoglobin, protein 30, small amount leukocyte esterase; 21-50 wbc/hpf; 21-50 rbc/hpf; few bacteria.  Urine pregnancy test negative.  Wet prep negative.  CT of the abdomen reveals obstructing 6-7 mm distal right ureteral calculus resulting in high-grade obstruction.  ASSESSMENT/PLAN: The patient is a 40 year old who presents with a one day history of right lower quadrant pain and nausea and vomiting, dehydration, and has a 6-7 mm right ureteral stone.  1. Abdominal pain secondary to ureteral stone.  The pain is much improved after     IV morphine. EDP discussed the case with Dr. Annabell Howells in urology and recommended     IV antibiotics for infection. Urology is to perform consultation in the     morning.  I will place the patient on morphine PCA, give her Phenergan, strain     urine.  2. Dehydration.  The  patient received two liters of normal saline bolus, then     maintenance IV fluids.  3. Wheezing secondary to asthma.  Albuterol nebulizers p.r.n.  4. Bipolar disorder, stable. DD:  09/18/99 TD:  09/18/99 Job: [id number]

## 2010-10-26 NOTE — Consult Note (Signed)
[place]. [facility]  Patient:    [name] [name], [name] [name]                MRN: [id number] Proc. Date: 09/18/99 Adm. Date:  [id number] Attending:  McDiarmid, Leighton Roach. Dictator:   [phone] CC:         Mena Pauls, M.D.             Excell Seltzer. Annabell Howells, M.D.                          Consultation Report  CHIEF COMPLAINT: Right flank pain.  HISTORY OF PRESENT ILLNESS: The patient is a 40 year old white female admitted ith right distal ureteral stone with obstruction and fever.  She had onset of severe pain on September 16, 1999 and the pain is associated with nausea and vomiting.  She  denies voiding symptoms or hematuria.  She has had fever.  She denies prior stones but has had prior UTI/pyelonephritis.  She denies GU surgery.  ALLERGIES: No known drug allergies.  CURRENT MEDICATIONS:  1. Darvocet.  2. Ativan.  3. Prozac.  4. Lithium.  5. Ventolin.  PAST MEDICAL HISTORY:  1. Bipolar disorder.  2. Migraines.  3. Asthma.  4. Gravida 4 para 4, with 4 normal vaginal deliveries.  PAST SURGICAL HISTORY:  1. Left knee surgery.  2. Tubal ligation.  SOCIAL HISTORY: Smokes half a pack of cigarettes a day.  Drinks occasional alcohol.  FAMILY HISTORY: Pertinent for stones.  REVIEW OF SYSTEMS: Negative for shortness of breath, constipation.  Her last period was in February 2001.  She is otherwise without complaints.  PHYSICAL EXAMINATION:  VITAL SIGNS: Temperature 100.4 degrees, pulse 96, blood pressure 114/74, respirations 20.  GENERAL: Well-developed, well-nourished white female in no acute distress, alert and oriented x 3.  HEENT: Head normocephalic, atraumatic.  LUNGS: Clear with normal effort.  HEART: Regular rate and rhythm.  ABDOMEN: Soft, obese, right CVA tenderness and right lower quadrant tenderness. No masses or hepatosplenomegaly or hernia noted.  GU/RECTAL: Not indicated at this time.  EXTREMITIES: Full range of motion without  edema.  NEUROLOGIC: Intact.  SKIN: Warm and dry.  LABORATORY DATA: Urine had 21-50 rbc/hpf, 21-50 wbc/hpf, occasional bacteria. BC 17.4 with 85% segs.  CT scan reveals 6-7 mm right distal ureteral stone with high-grade obstruction.  IMPRESSION:  1. Right distal ureteral stone with obstruction and possible urinary tract     infection.  2. Bipolar disorder.  3. Migraines.  4. Asthma.  PLAN: The plan at this time is to add Toradol to her pain regimen and plan for cystoscopy and stent insertion today.  The risks of that procedure including bleeding, infection, injury to the ureter with possible need for open surgery or percutaneous tube placement were explained to the patient as well as the possibility of anesthetic risks and blood clots.  DD:  09/18/99 TD:  09/18/99 Job: [id number]

## 2010-10-26 NOTE — Op Note (Signed)
[place]. [facility]  Patient:    [name] [name], [name] [name]                MRN: [id number] Proc. Date: 09/27/99 Adm. Date:  [id number] Disc. Date: [id number] Attending:  Evlyn Clines                           Operative Report  PROCEDURES:  Cystoscopy, removal of right double-J stent, balloon dilation of right ureter, right ureteroscopic stent extraction with insertion of right double-J stent.  PREOPERATIVE DIAGNOSIS:  Right distal ureteral stone.  POSTOPERATIVE DIAGNOSIS:  Right distal ureteral stone.  SURGEON:  Excell Seltzer. Annabell Howells, M.D.  ANESTHESIA:  General.  SPECIMEN:  Stone.  DRAINS:  6-French x 24 cm right double-J stent.  COMPLICATIONS:  None.  INDICATIONS:  Ms. Brodman is a 40 year old white female initially presented with urosepsis and a right sided stone.  She underwent stenting and her infection has cleared.  She is now to undergo a ureteroscopy.  She was initially planned for SL, but her COAGs were elevated.  FINDINGS OF PROCEDURE:  The patient was taken to the operating room, where a general anesthetic was induced.  She was placed in lithotomy position.  Her perineum and genitalia were prepped with Betadine solution.  She was draped in usual sterile fashion.  Cystoscopy was performed using the 22-French scope and he 12 degree lens.  The stent from the right ureter was grasped and removed through the urethral meatus, where a wire was placed and the stent was removed.  The cystoscope was then reinserted over the wire and a 10 cc/15-French balloon dilation catheter was passed and the ureter from the meatus to the stone was dilated to 2 atmospheres.  Once the wasting had disappeared, the balloon was deflated and removed.  A 6-French short ureteroscope was then passed along side the wire to he stone.  Attempt was made to engage the stone in a flat-wire basket.  The basket was not adequate for the stone.  The 6-French scope was  removed, a 9-French scope was then placed  using a 15 mm Pfister-Schwartz basket.  I was able to engage the stone and remove it without difficulty from the ureter.  The cystoscope was then reinserted over the wire and a 6-French/24 cm double-J stent with string was inserted under fluoroscopic guidance.  With removal of the wire, there was a good coil noted in the kidney and the bladder.  The bladder was then drained, cystoscope was removed, leaving the stent string exiting the urethra.  The stent string was tied and cut short.  The patient was taken down from lithotomy position.  Her anesthetic was reversed.  She was moved to recovery in stable condition.  There  were no complications.  Her stone will be sent for analysis. DD:  09/27/99 TD:  09/27/99 Job: [id number]

## 2010-10-26 NOTE — Discharge Summary (Signed)
NAMEALEXI, DORMINEY NO.:  [id number]   MEDICAL RECORD NO.:  [id number]          PATIENT TYPE:  IPS   LOCATION:  0406                          FACILITY:  [facility]   PHYSICIAN:  Jasmine Pang, M.D. DATE OF BIRTH:  07-05-1970   DATE OF ADMISSION:  09/05/2006  DATE OF DISCHARGE:  09/15/2006                               DISCHARGE SUMMARY   IDENTIFICATION:  This is a 40 year old divorced white female who was  admitted on an involuntary basis on 09/05/2006.   HISTORY OF PRESENT ILLNESS:  According to the commitment papers the  respondent has a history of bipolar disorder but dropped out of  treatment in 2002.  She is currently only on Topamax for migraines.  Apparently she has not slept in the past 10 days.  She was paranoid,  frightening her children saying that there is evil in the house. She  is currently staying with her ex-husband after breaking up with her  boyfriend of four years.  She was noted to be disorganized, having  flight of ideas and regresses to a child-like state.  She states that  her ex-husband needs to die although in her past history she  attempted suicide by jumping.  She could not survive in the community  and is a severe bipolar mixed state with psychosis.  She also was  thinking that she was pregnant, but has had her tubes tied and has had  several pregnancy tests in the past few days at [facility] ED that have  all been negative.   Unfortunately the patient is not able to really cooperate with the exam  at this time.  It is indicated in the record that she has had no  treatment since 2002.  She has not been here at [facility]  since 2001.  The patient's mother was bipolar and the paternal side of  her family was depressed.  She denies any drug or alcohol abuse.  She is  status post a tubal ligation four years ago, otherwise healthy.   PHYSICAL FINDINGS:  Physical exam was done in the ED.  She was medically  cleared.  There  were no acute physical abnormalities or distress.   ADMISSION LABORATORY:  CBC was grossly within normal limits.  Basic  metabolic panel was grossly within normal limits except for an elevated  glucose of 131, a slightly decreased BUN of 5, an indirect bilirubin of  0.5.  Urine pregnancy test was negative.  Hemoglobin A1c was 6 (4.6-  6.1).  Urine drug screen was negative.  Urinalysis was negative.   HOSPITAL COURSE:  Upon admission the patient was restarted on her Magic  mouthwash 2-3 teaspoons q.6 h p.r.n., Advair discus 550 inhalation  b.i.d., albuterol inhaler 90 mcg inhaler 1 puff p.o. q.4-6 h p.r.n.  She  was also started on Darvocet N 100 one p.o. q.6 h p.r.n. pain, Ativan 1-  2 mg p.o. or IM q.2 h p.r.n. anxiety, agitation, Risperdal M-Tab 2 mg  now.  She was also placed on nicotine patch 21 mg, 1-1/2 packs a day.  On 09/06/2006  the patient was placed on Risperdal M-Tab 2 mg p.o.  nightly, Risperdal M-Tab 0.5 mg p.o. q.4 h.  p.r.n. psychosis.  She was  placed on Cipro 250 mg p.o. b.i.d. x3 days for a UTI.  On 09/06/2006 she  was placed on Guaifenesin oral solution 200 mg per 10 liters q.6 h  p.r.n. cough.  On 09/07/2006 the patient was placed on Depakote ER 500  mg p.o. b.i.d. and Risperdal M-Tab 1 mg p.o. t.i.d. and 2 mg p.o. q.h.s.  She was also placed on Cogentin 1 mg b.i.d.  She was placed on Ben-Gay  as indicated on the tube.  On 09/07/2006 she was placed on Ambien 10 mg  p.o. q.h.s. p.r.n. insomnia.  On 09/08/2006 she was given Darvocet N 100  one p.o. now, then N-100 two q.6 h p.r.n. pain.  This was later  decreased to two1 q.6h. p.r.n. pain.  On 09/09/2006 she was ordered a  Depakote level, however, the patient had subsequently stopped her  Depakote stating she did not want to be on this because she felt she was  pregnant in spite of negative pregnancy test.  She was also placed on  Ambien 10 mg p.o. q.h.s. as a standing dose.  On 09/10/2006 a urine  pregnancy test was  done which was negative.  Depakote was discontinued  since she refused to take it.  On  09/12/2006 Risperdal was changed to 1 mg in the morning and 3 mg at  bedtime to reduce the amount of daily dosing.   The patient tolerated her medications well and she had no significant  side effects.      Jasmine Pang, M.D.  Electronically Signed     BHS/MEDQ  D:  09/15/2006  T:  09/15/2006  Job:  [id number]

## 2010-10-26 NOTE — Op Note (Signed)
[name]  Patient:    [name] [name], [name] [name]                MRN: [id number] Proc. Date: 09/18/99 Adm. Date:  [id number] Attending:  Evlyn Clines                           Operative Report  PROCEDURES: 1. Cystoscopy. 2. Right retrograde pyelogram with interpretation. 3. Insertion of right double-J stent.  PREOPERATIVE DIAGNOSIS:  Right ureteral stone.  POSTOPERATIVE DIAGNOSIS:  Right ureteral stone.  SURGEON:  Excell Seltzer. Annabell Howells, M.D.  ANESTHESIA:  General.  DRAINS:  A 6 French x 24 cm right double-J stent.  COMPLICATIONS:  None.  INDICATIONS FOR PROCEDURE:  The patient is a 40 year old white female who presented with a two-day history of right flank pain.  CT in the ER revealed high grade obstruction and a large right distal ureteral stone.  She had fever and elevated white count, and it was felt that stenting was indicated to decompress the kidney.  PROCEDURE AND FINDINGS:  The patient was taken to the operating room where a general anesthesia was induced and she was placed in the lithotomy position. THe perineum and genitalia were prepped with Betadine solution and she was draped in the usual sterile fashion.  Cystoscope was performed with a 22 [place] scope and the 12 and 70 degree lenses.  Examination revealed a normal bladder wall.  THe ureteral orifices were in their normal anatomic position.  The urethra was unremarkable.  After completion of cystoscopy, a 6 French open-ended catheter was inserted into the right ureteral orifice.  Prior to injection of contrast, a 7 mm calcification was noted in the right pelvis.  With injection of contrast, this calcification as noted to be within the ureter approximately 4 cm proximal to the meatus. Proximal to this stone was hydronephrosis.  Once the position of the stone was confirmed, a guidewire was passed through the open-ended catheter to the kidney. The open-ended catheter  was removed and a 6 [place], 24 cm double-J stent was inserted without difficulty.  The string had been removed prior to insertion.  Upon removal of the wire, a good coil of stent was noted in the kidney and in the bladder.  At this  point, the bladder was drained.  There was some turbid urine from the kidney. he cystoscope sheath was then removed.  The patients anesthetic was reversed.  She  was moved to the recovery room in stable condition.  There were no complications. DD:  09/18/99 TD:  09/19/99 Job: [id number]

## 2011-04-27 ENCOUNTER — Encounter: Payer: Self-pay | Admitting: *Deleted

## 2011-04-27 ENCOUNTER — Emergency Department (HOSPITAL_COMMUNITY)
Admission: EM | Admit: 2011-04-27 | Discharge: 2011-04-27 | Disposition: A | Discharge status: Home / Self Care | Payer: Medicaid Other | Source: Home / Self Care | Attending: Family Medicine | Admitting: Family Medicine

## 2011-04-27 DIAGNOSIS — B370 Candidal stomatitis: Secondary | ICD-10-CM

## 2011-04-27 MED ORDER — FLUCONAZOLE 200 MG PO TABS: 200.0000 mg | ORAL_TABLET | Freq: Every day | ORAL | Status: AC

## 2011-04-27 MED ORDER — MAGIC MOUTHWASH: 10.0000 mL | Freq: Four times a day (QID) | ORAL | Status: DC

## 2011-04-27 NOTE — ED Provider Notes (Signed)
History     CSN: [id number] Arrival date & time: 04/27/2011  4:08 PM   First MD Initiated Contact with Patient 04/27/11 1504      Chief Complaint  Patient presents with  . Sore Throat    pt with c/o sorethroat x 7 days - sore roof of mouth x 3 days - migraine headache onset since arrival  [facility]  . Migraine    (Consider location/radiation/quality/duration/timing/severity/associated sxs/prior treatment) Patient is a 40 y.o. female presenting with pharyngitis and migraine. The history is provided by the patient.  Sore Throat The current episode started more than 2 days ago. The problem occurs constantly. The problem has been gradually worsening. The symptoms are aggravated by nothing. The symptoms are relieved by nothing.  Migraine    No past medical history on file.  No past surgical history on file.  No family history on file.  History  Substance Use Topics  . Smoking status: Not on file  . Smokeless tobacco: Not on file  . Alcohol Use: Not on file    OB History    No data available      Review of Systems  Constitutional: Negative.   HENT: Positive for sore throat and mouth sores. Negative for congestion, rhinorrhea, postnasal drip and sinus pressure.     Allergies  Review of patient's allergies indicates no known allergies.  Home Medications   Current Outpatient Rx  Name Route Sig Dispense Refill  . ALBUTEROL 90 MCG/ACT IN AERS Inhalation Inhale 1 puff into the lungs. every 4-6 hours as needed    . ERGOCALCIFEROL 50000 UNITS PO CAPS Oral Take 50,000 Units by mouth once a week. Take for 12 weeks     . LAMOTRIGINE 25 MG PO TABS  Take 1 tab by mouth daily x 2 weeks, then 2 by mouth daily x 2 weeks, then 4 by mouth daily.    Marland Kitchen LISINOPRIL-HYDROCHLOROTHIAZIDE 20-12.5 MG PO TABS Oral Take 1 tablet by mouth daily.      Marland Kitchen METFORMIN HCL 1000 MG PO TABS Oral Take 1,000 mg by mouth 2 (two) times daily.     Marland Kitchen OMEPRAZOLE 20 MG PO CPDR Oral Take 20 mg by mouth daily.      .  QUETIAPINE FUMARATE 100 MG PO TABS Oral Take 100 mg by mouth daily.       BP 132/79  Pulse 92  Temp(Src) 98.1 F (36.7 C) (Oral)  Resp 18  SpO2 100%  Physical Exam  Nursing note and vitals reviewed. Constitutional: She appears well-developed and well-nourished.  HENT:  Head: Normocephalic.  Right Ear: External ear normal.  Left Ear: External ear normal.  Nose: Nose normal.  Mouth/Throat: Uvula is midline. Oral lesions present. No uvula swelling. Oropharyngeal exudate present.      ED Course  Procedures (including critical care time)  Labs Reviewed - No data to display No results found.   No diagnosis found.    MDM          Barkley Bruns, MD 04/27/11 [phone]

## 2011-07-07 ENCOUNTER — Other Ambulatory Visit: Payer: Self-pay

## 2011-07-07 ENCOUNTER — Encounter (HOSPITAL_COMMUNITY): Payer: Self-pay | Admitting: Emergency Medicine

## 2011-07-07 ENCOUNTER — Emergency Department (HOSPITAL_COMMUNITY)
Admission: EM | Admit: 2011-07-07 | Discharge: 2011-07-07 | Disposition: A | Discharge status: Home / Self Care | Payer: Medicaid Other | Source: Home / Self Care

## 2011-07-07 DIAGNOSIS — E119 Type 2 diabetes mellitus without complications: Secondary | ICD-10-CM

## 2011-07-07 DIAGNOSIS — I10 Essential (primary) hypertension: Secondary | ICD-10-CM

## 2011-07-07 DIAGNOSIS — B370 Candidal stomatitis: Secondary | ICD-10-CM

## 2011-07-07 HISTORY — DX: Anxiety disorder, unspecified: F41.9

## 2011-07-07 MED ORDER — NYSTATIN 100000 UNIT/ML MT SUSP: 500000.0000 [IU] | Freq: Four times a day (QID) | OROMUCOSAL | Status: AC

## 2011-07-07 MED ORDER — METFORMIN HCL 500 MG PO TABS: 500.0000 mg | ORAL_TABLET | Freq: Two times a day (BID) | ORAL | Status: DC

## 2011-07-07 NOTE — ED Provider Notes (Signed)
History     CSN: [id number]  Arrival date & time 07/07/11  7829   None     Chief Complaint  Patient presents with  . Cough  . Thrush    (Consider location/radiation/quality/duration/timing/severity/associated sxs/prior treatment) HPI Comments: Pt here with mouth/throat pain, "I think I have thrush again". Has not taken any medications other than using albuterol 2x per week "since I was in the hospital last year".  Has not been checking blood sugar.  Has not f/u with [facility] family practice.    Patient is a 42 y.o. female presenting with cough. The history is provided by the patient.  Cough The current episode started more than 2 days ago. The problem occurs every few minutes. The problem has not changed since onset.The cough is non-productive. There has been no fever. Associated symptoms include sore throat. Pertinent negatives include no chest pain, no chills, no ear pain, no shortness of breath and no wheezing. She has tried nothing for the symptoms.    Past Medical History  Diagnosis Date  . Diabetes mellitus   . Asthma   . Anxiety disorder     Past Surgical History  Procedure Date  . Knee arthroscopy   . Tubal ligation     History reviewed. No pertinent family history.  History  Substance Use Topics  . Smoking status: Current Everyday Smoker  . Smokeless tobacco: Not on file  . Alcohol Use: No    OB History    Grav Para Term Preterm Abortions TAB SAB Ect Mult Living                  Review of Systems  Constitutional: Negative for fever and chills.  HENT: Positive for congestion, sore throat, mouth sores and postnasal drip. Negative for ear pain, dental problem and sinus pressure.   Respiratory: Positive for cough. Negative for shortness of breath and wheezing.   Cardiovascular: Negative for chest pain and palpitations.    Allergies  Review of patient's allergies indicates no known allergies.  Home Medications   Current Outpatient Rx  Name Route Sig  Dispense Refill  . ALBUTEROL 90 MCG/ACT IN AERS Inhalation Inhale 1 puff into the lungs. every 4-6 hours as needed    . METFORMIN HCL 500 MG PO TABS Oral Take 1 tablet (500 mg total) by mouth 2 (two) times daily with a meal. 60 tablet 3  . NYSTATIN 100000 UNIT/ML MT SUSP Oral Take 5 mLs (500,000 Units total) by mouth 4 (four) times daily. Continue to use until 2 days after mouth is completely healed. 240 mL 0    BP 171/105  Pulse 133  Temp(Src) 99 F (37.2 C) (Oral)  Resp 20  LMP 05/15/2011  Physical Exam  Constitutional: She appears well-developed and well-nourished. No distress.  HENT:  Right Ear: Tympanic membrane, external ear and ear canal normal.  Left Ear: Tympanic membrane, external ear and ear canal normal.  Nose: Mucosal edema and rhinorrhea present.  Mouth/Throat:    Cardiovascular: Regular rhythm.  Tachycardia present.   Pulmonary/Chest: Effort normal and breath sounds normal.    ED Course  Procedures (including critical care time)  Labs Reviewed  GLUCOSE, CAPILLARY - Abnormal; Notable for the following:    Glucose-Capillary 269 (*)    All other components within normal limits  I-STAT, CHEM 8   No results found.   1. Candidiasis of mouth   2. Diabetes mellitus   3. Hypertension     Pt refused istat blood draw.  Stressed to pt importance of f/u and getting back on medication for bp and dm.  Reviewed with pt consequences of uncontrolled bp and dm.  Pt agrees to seek f/u.     MDM  Pt tachycardic, reports drinking 2 red bulls this morning "my sister said it would help me clean my house"; advised pt never to drink red bull again.    EKG nl except shows tachycardia. Pt asymptomatic.  This is likely due to red bull consumption this morning.       Cathlyn Parsons, NP 07/07/11 [phone]

## 2011-07-07 NOTE — ED Notes (Signed)
Pt given discharge instruction  No sign pad

## 2011-07-07 NOTE — ED Notes (Signed)
Patient refused to have I-stat 8 drawn.  Agreed to have CBG done  NP made aware

## 2011-07-07 NOTE — ED Notes (Signed)
Pt has cough and irritation in roof of mouth for two days, she recently was seen here and treated for thrush.  Her heart rate is 133 and states she drank two red bulls this am.

## 2011-07-10 NOTE — ED Provider Notes (Signed)
Medical screening examination/treatment/procedure(s) were performed by non-physician practitioner and as supervising physician I was immediately available for consultation/collaboration.  Luiz Blare MD   Luiz Blare, MD 07/10/11 2128

## 2012-05-21 ENCOUNTER — Encounter (HOSPITAL_COMMUNITY): Payer: Self-pay | Admitting: *Deleted

## 2012-05-21 ENCOUNTER — Emergency Department (HOSPITAL_COMMUNITY): Payer: Medicaid Other

## 2012-05-21 ENCOUNTER — Emergency Department (HOSPITAL_COMMUNITY)
Admission: EM | Admit: 2012-05-21 | Discharge: 2012-05-21 | Disposition: A | Discharge status: Home / Self Care | Payer: Medicaid Other | Attending: Emergency Medicine | Admitting: Emergency Medicine

## 2012-05-21 DIAGNOSIS — M79642 Pain in left hand: Secondary | ICD-10-CM

## 2012-05-21 DIAGNOSIS — E119 Type 2 diabetes mellitus without complications: Secondary | ICD-10-CM | POA: Diagnosis present

## 2012-05-21 DIAGNOSIS — M79643 Pain in unspecified hand: Secondary | ICD-10-CM

## 2012-05-21 DIAGNOSIS — Z79899 Other long term (current) drug therapy: Secondary | ICD-10-CM | POA: Insufficient documentation

## 2012-05-21 DIAGNOSIS — R21 Rash and other nonspecific skin eruption: Secondary | ICD-10-CM | POA: Insufficient documentation

## 2012-05-21 DIAGNOSIS — Z9851 Tubal ligation status: Secondary | ICD-10-CM | POA: Insufficient documentation

## 2012-05-21 DIAGNOSIS — F17200 Nicotine dependence, unspecified, uncomplicated: Secondary | ICD-10-CM | POA: Insufficient documentation

## 2012-05-21 DIAGNOSIS — J45909 Unspecified asthma, uncomplicated: Secondary | ICD-10-CM | POA: Insufficient documentation

## 2012-05-21 DIAGNOSIS — M79641 Pain in right hand: Secondary | ICD-10-CM

## 2012-05-21 DIAGNOSIS — F312 Bipolar disorder, current episode manic severe with psychotic features: Secondary | ICD-10-CM | POA: Diagnosis present

## 2012-05-21 DIAGNOSIS — F319 Bipolar disorder, unspecified: Secondary | ICD-10-CM | POA: Insufficient documentation

## 2012-05-21 DIAGNOSIS — F341 Dysthymic disorder: Secondary | ICD-10-CM

## 2012-05-21 DIAGNOSIS — F411 Generalized anxiety disorder: Secondary | ICD-10-CM | POA: Insufficient documentation

## 2012-05-21 DIAGNOSIS — M79609 Pain in unspecified limb: Secondary | ICD-10-CM | POA: Insufficient documentation

## 2012-05-21 HISTORY — DX: Bipolar disorder, unspecified: F31.9

## 2012-05-21 LAB — SEDIMENTATION RATE: Sed Rate: 10 mm/hr (ref 0–22)

## 2012-05-21 LAB — COMPREHENSIVE METABOLIC PANEL
ALT: 17 U/L (ref 0–35)
Alkaline Phosphatase: 164 U/L — ABNORMAL HIGH (ref 39–117)
BUN: 5 mg/dL — ABNORMAL LOW (ref 6–23)
CO2: 23 mEq/L (ref 19–32)
Chloride: 100 mEq/L (ref 96–112)
GFR calc Af Amer: >90 mL/min (ref >90)
GFR calc non Af Amer: >90 mL/min (ref >90)
Glucose, Bld: 387 mg/dL — ABNORMAL HIGH (ref 70–99)
Potassium: 3.2 mEq/L — ABNORMAL LOW (ref 3.5–5.1)
Total Bilirubin: 0.2 mg/dL — ABNORMAL LOW (ref 0.3–1.2)
Total Protein: 6.5 g/dL (ref 6.0–8.3)

## 2012-05-21 LAB — CBC WITH DIFFERENTIAL/PLATELET
Eosinophils Absolute: 0.1 10*3/uL (ref 0.0–0.7)
Hemoglobin: 12.7 g/dL (ref 12.0–15.0)
Lymphocytes Relative: 37 % (ref 12–46)
Lymphs Abs: 2.2 10*3/uL (ref 0.7–4.0)
MCH: 28.4 pg (ref 26.0–34.0)
Monocytes Relative: 8 % (ref 3–12)
Neutrophils Relative %: 52 % (ref 43–77)
RBC: 4.47 MIL/uL (ref 3.87–5.11)
WBC: 5.8 10*3/uL (ref 4.0–10.5)

## 2012-05-21 LAB — HEMOGLOBIN A1C: Mean Plasma Glucose: 315 mg/dL — ABNORMAL HIGH (ref <117)

## 2012-05-21 MED ORDER — METHYLPREDNISOLONE SODIUM SUCC 125 MG IJ SOLR
125.0000 mg | Freq: Once | INTRAMUSCULAR | Status: AC
  Administered 2012-05-21: 125 mg via INTRAVENOUS
  Filled 2012-05-21: qty 2

## 2012-05-21 MED ORDER — OXYCODONE-ACETAMINOPHEN 5-325 MG PO TABS
1.0000 | ORAL_TABLET | Freq: Once | ORAL | Status: AC
  Administered 2012-05-21: 1 via ORAL
  Filled 2012-05-21: qty 1

## 2012-05-21 MED ORDER — SODIUM CHLORIDE 0.9 % IV BOLUS (SEPSIS)
1000.0000 mL | Freq: Once | INTRAVENOUS | Status: AC
  Administered 2012-05-21: 1000 mL via INTRAVENOUS

## 2012-05-21 MED ORDER — MORPHINE SULFATE 4 MG/ML IJ SOLN
4.0000 mg | Freq: Once | INTRAMUSCULAR | Status: AC
  Administered 2012-05-21: 4 mg via INTRAVENOUS
  Filled 2012-05-21: qty 1

## 2012-05-21 MED ORDER — ALPRAZOLAM 0.5 MG PO TABS: 0.5000 mg | ORAL_TABLET | Freq: Two times a day (BID) | ORAL | Status: DC | PRN

## 2012-05-21 MED ORDER — AMLEXANOX 5 % MT PSTE: PASTE | Freq: Four times a day (QID) | OROMUCOSAL | Status: DC

## 2012-05-21 MED ORDER — FLUOXETINE HCL 20 MG PO CAPS: 20.0000 mg | ORAL_CAPSULE | Freq: Every day | ORAL | Status: DC

## 2012-05-21 MED ORDER — ONDANSETRON HCL 4 MG/2ML IJ SOLN
4.0000 mg | Freq: Once | INTRAMUSCULAR | Status: AC
  Administered 2012-05-21: 4 mg via INTRAVENOUS
  Filled 2012-05-21: qty 2

## 2012-05-21 MED ORDER — ALPRAZOLAM 0.25 MG PO TABS
0.5000 mg | ORAL_TABLET | Freq: Once | ORAL | Status: AC
  Administered 2012-05-21: 0.5 mg via ORAL
  Filled 2012-05-21: qty 2

## 2012-05-21 MED ORDER — GABAPENTIN 300 MG PO CAPS: 300.0000 mg | ORAL_CAPSULE | Freq: Every day | ORAL | Status: DC

## 2012-05-21 MED ORDER — METFORMIN HCL 500 MG PO TABS: 500.0000 mg | ORAL_TABLET | Freq: Two times a day (BID) | ORAL | Status: DC

## 2012-05-21 MED ORDER — HYDROMORPHONE HCL PF 1 MG/ML IJ SOLN
1.0000 mg | Freq: Once | INTRAMUSCULAR | Status: AC
  Administered 2012-05-21: 1 mg via INTRAVENOUS
  Filled 2012-05-21: qty 1

## 2012-05-21 MED ORDER — PREDNISONE 10 MG PO TABS: ORAL_TABLET | ORAL | Status: DC

## 2012-05-21 MED ORDER — TRAMADOL HCL 50 MG PO TABS
50.0000 mg | ORAL_TABLET | Freq: Three times a day (TID) | ORAL | Status: DC | PRN
Start: 2012-05-21 — End: 2012-05-23

## 2012-05-21 MED ORDER — GABAPENTIN 300 MG PO CAPS
300.0000 mg | ORAL_CAPSULE | Freq: Once | ORAL | Status: AC
  Administered 2012-05-21: 300 mg via ORAL
  Filled 2012-05-21: qty 1

## 2012-05-21 MED ORDER — ALBUTEROL SULFATE HFA 108 (90 BASE) MCG/ACT IN AERS: 2.0000 | INHALATION_SPRAY | Freq: Four times a day (QID) | RESPIRATORY_TRACT | Status: DC | PRN

## 2012-05-21 MED ORDER — TRAMADOL HCL 50 MG PO TABS
50.0000 mg | ORAL_TABLET | Freq: Once | ORAL | Status: AC
  Administered 2012-05-21: 50 mg via ORAL
  Filled 2012-05-21: qty 1

## 2012-05-21 MED ORDER — POTASSIUM CHLORIDE CRYS ER 20 MEQ PO TBCR
40.0000 meq | EXTENDED_RELEASE_TABLET | Freq: Once | ORAL | Status: AC
  Administered 2012-05-21: 40 meq via ORAL
  Filled 2012-05-21: qty 2

## 2012-05-21 MED ORDER — SODIUM CHLORIDE 0.9 % IV SOLN
Freq: Once | INTRAVENOUS | Status: AC
  Administered 2012-05-21: 125 mL/h via INTRAVENOUS

## 2012-05-21 MED ORDER — FLUCONAZOLE 100 MG PO TABS
150.0000 mg | ORAL_TABLET | Freq: Every day | ORAL | Status: DC
  Administered 2012-05-21: 150 mg via ORAL
  Filled 2012-05-21 (×2): qty 2

## 2012-05-21 MED ORDER — MORPHINE SULFATE 4 MG/ML IJ SOLN: 4.0000 mg | Freq: Once | INTRAMUSCULAR | Status: DC

## 2012-05-21 NOTE — ED Provider Notes (Signed)
History     CSN: [id number]  Arrival date & time 05/21/12  0867   First MD Initiated Contact with Patient 05/21/12 0341      Chief Complaint  Patient presents with  . Respiratory Distress    (Consider location/radiation/quality/duration/timing/severity/associated sxs/prior treatment) The history is provided by the patient.   41 year old female presented by ambulance after being awakened at 2 AM because of wheezing and difficulty breathing. She was given an albuterol treatment by EMS and breathing is completely cleared. This was her first asthma attack in a long time. She relates that she has been having pain in her hands or weakness in her hands for the last 5 days. She noticed that her red rash which felt hot over and this is over her hands and forearms. She has severe, burning pain in her fingers and radiating up into her forearm. She rates pain at 8/10. She is not aware of any rash anywhere else. She states that she's having difficulty using her hands because of pain. Nothing makes it better nothing makes it worse. She also relates that she has been having a vaginal rash for over a month. She thought it might of been used and treated with Monistat with no benefit, so she's been using hydrocortisone cream. Of note, she was started on Cymbalta about 3 weeks ago. She stopped taking it 5 days ago because he was worried that current symptoms are a side effect of the Cymbalta.  Past Medical History  Diagnosis Date  . Diabetes mellitus   . Asthma   . Anxiety disorder   . Bipolar affective disorder     Past Surgical History  Procedure Date  . Knee arthroscopy   . Tubal ligation     No family history on file.  History  Substance Use Topics  . Smoking status: Current Every Day Smoker  . Smokeless tobacco: Not on file  . Alcohol Use: No    OB History    Grav Para Term Preterm Abortions TAB SAB Ect Mult Living                  Review of Systems  All other systems reviewed and  are negative.    Allergies  Review of patient's allergies indicates no known allergies.  Home Medications   Current Outpatient Rx  Name  Route  Sig  Dispense  Refill  . TYLENOL PO   Oral   Take 1 tablet by mouth every 6 (six) hours as needed. For pain         . ASPIRIN PO   Oral   Take 1 tablet by mouth daily as needed. For pain         . DULOXETINE HCL 60 MG PO CPEP   Oral   Take 60 mg by mouth daily.         . IBUPROFEN PO   Oral   Take 1 tablet by mouth every 6 (six) hours as needed. For pain         . ALBUTEROL SULFATE (2.5 MG/3ML) 0.083% IN NEBU   Nebulization   Take 2.5 mg by nebulization once.         . ALBUTEROL 90 MCG/ACT IN AERS   Inhalation   Inhale 1 puff into the lungs. every 4-6 hours as needed           BP 138/68  Pulse 112  Temp 98.1 F (36.7 C) (Oral)  Resp 14  SpO2 97%  Physical Exam  Nursing note and vitals reviewed.  41 year old female, resting comfortably and in no acute distress. Vital signs are significant for tachycardia with heart rate 112. Oxygen saturation is 97%, which is normal. Head is normocephalic and atraumatic. PERRLA, EOMI. Oropharynx shows some target-like lesions on the soft and hard palate. Neck is nontender and supple without adenopathy or JVD. Back is nontender and there is no CVA tenderness. Lungs are clear without rales, wheezes, or rhonchi. Chest is nontender. Heart has regular rate and rhythm without murmur. Abdomen is soft, flat, nontender without masses or hepatosplenomegaly and peristalsis is normoactive. Pelvic: Intense erythema is noted of the external genitalia and inguinal folds consistent with monilia. Cervix appears normal and there is no vaginal discharge present. There is no adnexal mass or tenderness. Fundus is normal size and position. Extremities: There is mild swelling of the hands and faint erythema of the hands and forearms. She has significant weakness of posterior leg with strength  3/5.Marland Kitchen Skin is warm and dry without other rash. Neurologic: Mental status is normal, cranial nerves are intact, there are no motor or sensory deficits.  ED Course  Procedures (including critical care time)  Results for orders placed during the hospital encounter of 05/21/12  CBC WITH DIFFERENTIAL      Component Value Range   WBC 5.8  4.0 - 10.5 K/uL   RBC 4.47  3.87 - 5.11 MIL/uL   Hemoglobin 12.7  12.0 - 15.0 g/dL   HCT 37.5  36.0 - 46.0 %   MCV 83.9  78.0 - 100.0 fL   MCH 28.4  26.0 - 34.0 pg   MCHC 33.9  30.0 - 36.0 g/dL   RDW 13.4  11.5 - 15.5 %   Platelets 194  150 - 400 K/uL   Neutrophils Relative 52  43 - 77 %   Neutro Abs 3.0  1.7 - 7.7 K/uL   Lymphocytes Relative 37  12 - 46 %   Lymphs Abs 2.2  0.7 - 4.0 K/uL   Monocytes Relative 8  3 - 12 %   Monocytes Absolute 0.5  0.1 - 1.0 K/uL   Eosinophils Relative 2  0 - 5 %   Eosinophils Absolute 0.1  0.0 - 0.7 K/uL   Basophils Relative 1  0 - 1 %   Basophils Absolute 0.0  0.0 - 0.1 K/uL  COMPREHENSIVE METABOLIC PANEL      Component Value Range   Sodium 137  135 - 145 mEq/L   Potassium 3.2 (*) 3.5 - 5.1 mEq/L   Chloride 100  96 - 112 mEq/L   CO2 23  19 - 32 mEq/L   Glucose, Bld 387 (*) 70 - 99 mg/dL   BUN 5 (*) 6 - 23 mg/dL   Creatinine, Ser 0.53  0.50 - 1.10 mg/dL   Calcium 8.9  8.4 - 10.5 mg/dL   Total Protein 6.5  6.0 - 8.3 g/dL   Albumin 3.5  3.5 - 5.2 g/dL   AST 16  0 - 37 U/L   ALT 17  0 - 35 U/L   Alkaline Phosphatase 164 (*) 39 - 117 U/L   Total Bilirubin 0.2 (*) 0.3 - 1.2 mg/dL   GFR calc non Af Amer >90  >90 mL/min   GFR calc Af Amer >90  >90 mL/min  SEDIMENTATION RATE      Component Value Range   Sed Rate 10  0 - 22 mm/hr    1. Hand pain  MDM  Hand and arm pain of uncertain cause. However, lesions in the mouth suggest possibility of erythema multiforme with Katherina Right syndrome. Because of tachycardia, she will be given an IV fluid bolus and laboratory workup will be initiated. It certainly  seems probable that her symptoms are related to her Cymbalta. Pelvic exam will be done to try and determine the cause of her vaginal rash.  Genital rash seems to be a monilial. Laboratory workup was significant only for hyperglycemia and mild hypokalemia. Also, mild elevation of alkaline phosphatase. She's not had any further wheezing while in the emergency department. She has been given IV methylprednisolone. She has been receiving narcotics for pain control without relief. Because of possible Stevens-Johnson syndrome, and also inability to control pain, and she will need to be admitted. Case is discussed with outpatient clinics who agreed to admit the patient.     Delora Fuel, MD [phone]

## 2012-05-21 NOTE — ED Notes (Signed)
Patient stated she doesn't have any money to buy food so she usually eats once a day but drinks 12 cans of Coke a day

## 2012-05-21 NOTE — ED Notes (Signed)
Patient continues to states that her hands are hurting worse then before.  Tearful at this time.

## 2012-05-21 NOTE — ED Notes (Signed)
Amb to the bathroom without difficulty  Back to bed and monitor reapplied

## 2012-05-21 NOTE — Consult Note (Signed)
Internal Medicine Teaching Service Attending Note Date: 05/21/2012  Patient name: [name] [name]  Medical record number: [id number]  Date of birth: Dec 28, 1970   Chief Complaint: Shortness of Breath, hand pain . History of Present Illness The patient, [name] [name], is a 41 y.o. year old female who comes in with the chief complaint of shortness of breath. I have read the note by Dr. Lyn Hollingshead and I agree with the HPI with the following additions/changes:  Shortness of breath: Started suddenly today, patient usually does not need asthma medications a lot. However she has not feeling well since a few days for reasons unrelated to the lungs. She has not seen a medical provider for this is a long time. She does not have a PCP. After receiving a shot of prednisolone and nebulizations she feels much better.  Pain in her hands: For the past 5 days the patient describes a throbbing pain in her hands, bilaterally, which is so bad (10/10 she says) that it has not allowed her to sleep at night. She describes it as sharp, throbbing, and pins and needles like. It is not associated with morning stiffness, pain in any other joint currently or in the recent past. Pain mostly resides in the fingers, not so much in the wrist. The patient is crying as she tells me this. Along with the pain, she developed a red rash on her left forearm according to her, and ulcers in her mouth. The rash has resolved now and I could not appreciate any abnormality of skin on her forearm. She does have aphthous ulcers in her mouth. Of note, the patient was on Cymbalta for the past 3 weeks for depression, and stopped taking it 3 days ago because she suspected that the lesions might be from the drug. She denies any fever, chills, or any influenza like symptoms. She has never had such attacks in the past. She does not have any triggers that bring the pain on.    Depression: She is crying and is depressed right now, however denies any SI or  HI. Not seeing any mental health services.  Diabetes: She was on metformin but has stopped taking it. She does not check her blood sugars.  Past Medical History   has a past medical history of Diabetes mellitus; Asthma; Anxiety disorder; and Bipolar affective disorder. No episodes like this one in the past.   Medications  Was on Cymbalta, has stopped taking it. Not taking NSAIDs.   Family History Father has diabetes, and mother has arthritis. She denies any person in the family suffering from gout, thyroid disorders, or skin disorders.   Social History  reports that she has been smoking.  She does not have any smokeless tobacco history on file. She reports that she does not drink alcohol or use illicit drugs. She has 4 grown up kids. Her 69 year old daughter just had a baby and she is excited about it.   Review of Systems Positive for - hand pain, depression, recent rash that vaginal discharge and itching, aphthous ulcers, headache. Negative for - color change in her hands, fever, chills, current rashes, nausea, vomiting.  Vital Signs trended: Filed Vitals:   05/21/12 0345 05/21/12 0347 05/21/12 0615 05/21/12 0616  BP: 138/68 138/68  150/85  Pulse: 100 112 114   Temp:  98.1 F (36.7 C)    TempSrc:  Oral    Resp: 19 14 12 20   SpO2: 97% 97% 95% 97%    Physical Exam:  I met with patient around 11:30am  today  Vitals reviewed.  General: Sitting in bed, very anxious, crying.  HEENT: PERRL, EOMI, no scleral icterus. Profuse oral aphthous ulceration on tongue and sides of oral cavity.  Heart: RRR, tachycardia, no rubs, murmurs or gallops. Lungs: Clear to auscultation bilaterally, moving good air, no wheezes, rales, or rhonchi. Abdomen: Soft, nontender, nondistended, BS present. Extremities: Fingers in both hands appear mildly swollen. No erythema, or rash noted.  Neuro: Alert and oriented X3, cranial nerves II-XII grossly intact,  strength and sensation to light touch equal in  bilateral upper and lower extremities. No appreciable change in sensation on both hands. Motor strength of both hands intact. Skin: Bruises on multiple places.  Lab results: CMP     Component Value Date/Time   NA 137 05/21/2012 0421   K 3.2* 05/21/2012 0421   CL 100 05/21/2012 0421   CO2 23 05/21/2012 0421   GLUCOSE 387* 05/21/2012 0421   BUN 5* 05/21/2012 0421   CREATININE 0.53 05/21/2012 0421   CALCIUM 8.9 05/21/2012 0421   PROT 6.5 05/21/2012 0421   ALBUMIN 3.5 05/21/2012 0421   AST 16 05/21/2012 0421   ALT 17 05/21/2012 0421   ALKPHOS 164* 05/21/2012 0421   BILITOT 0.2* 05/21/2012 0421   GFRNONAA >90 05/21/2012 0421   GFRAA >90 05/21/2012 0421   CBC    Component Value Date/Time   WBC 5.8 05/21/2012 0421   RBC 4.47 05/21/2012 0421   HGB 12.7 05/21/2012 0421   HCT 37.5 05/21/2012 0421   PLT 194 05/21/2012 0421   MCV 83.9 05/21/2012 0421   MCH 28.4 05/21/2012 0421   MCHC 33.9 05/21/2012 0421   RDW 13.4 05/21/2012 0421   LYMPHSABS 2.2 05/21/2012 0421   MONOABS 0.5 05/21/2012 0421   EOSABS 0.1 05/21/2012 0421   BASOSABS 0.0 05/21/2012 0421    Urinalysis    Component Value Date/Time   COLORURINE YELLOW 06/25/2010 0133   APPEARANCEUR CLEAR 06/25/2010 0133   LABSPEC 1.037* 06/25/2010 0133   PHURINE 6.0 06/25/2010 0133   GLUCOSEU 250* 06/02/2010 1701   HGBUR NEGATIVE 06/25/2010 0133   HGBUR trace-intact 11/25/2008 1530   BILIRUBINUR NEGATIVE 06/25/2010 0133   KETONESUR NEGATIVE 06/25/2010 0133   PROTEINUR NEGATIVE 06/25/2010 0133   UROBILINOGEN 0.2 06/25/2010 0133   NITRITE NEGATIVE 06/25/2010 0133   LEUKOCYTESUR NEGATIVE 06/25/2010 0133     Imaging results:  Dg Hand 2 View Right  05/21/2012  *RADIOLOGY REPORT*  Clinical Data: Pain and swelling, no injury  RIGHT HAND - 2 VIEW  Comparison: None.  Findings: The radiocarpal joint space appears normal.  There is mild ulnar negative variance.  Slight irregularity of the lunate is seen which may be due to impaction.  Joint  spaces appear normal. No erosion is seen.  Slight irregularity of the tuft of the fourth digit may be congenital.  IMPRESSION:  1.  No acute abnormality.  No erosion. 2.  Slight ulnar negative variance with minimal irregularity of the lunate.  Question ulnar impaction.   Original Report Authenticated By: Dwyane Dee, M.D.    Dg Hand 2 View Left  05/21/2012  *RADIOLOGY REPORT*  Clinical Data: Hand pain and swelling, no injury  LEFT HAND - 2 VIEW  Comparison: None.  Findings: There is ulnar negative variance.  The carpal bones are normal position with no abnormality noted.  Joint spaces appear normal and no erosion is seen.  No acute abnormality is noted.  IMPRESSION: Ulnar negative variance.  No acute  abnormality.   Original Report Authenticated By: Dwyane Dee, M.D.     Assessment and Plan:  I. Asthma: We will discharge the patient on albuterol and prednisone taper.  II. Hand Pain: I am not sure what the etiology of the hand pain is. We did an Xray which does not reveal any trauma, joint issues, arthritic changes, etc.   The differentials that I am considering are the following:  1. Arthritis: This would have been my top differential, however, the patient no responding to morphine, percocet, or dilaudid makes it harder to consider. We have checked for GC/Chlamydia for vaginal discharge, it might also enlighten [place] about any arthritic etiology.   2. Raynaud's/SLE/Behcet's: For raynaud's, she does not complain of any temperature triggers. There are no white/red demarcations that I can appreciate in her fingers. No other sites in the body seem to be involved. No such attacks in the past. For SLE, again, she does have forearm rash, bruising and arthalgia and possibly fatigue, but no fever chills, chest pain, malar rash, kidney involvement, anemia etc. For behcet's, she does have oral ulcerations (but not recurrent), arthritis but no genital ulcers at this time.   3. Viviann Spare Johnson's Syndrome S/P Cymbalta: To  argue for this rare complication of cymbalta, I would say that the symptoms started after a drug, resolved after stopping the drug, there is an oral lesion + skin lesion, and her alkaline phosphatase is increased. Having said that, this is indeed an extremely rare manifestation, there is no fever, chills, hand pain is unusual in SJS, there is no neutropenia, or any other suggestive rashes which I can note. 4. Conversion Disorder/psychosomatic: This would be a diagnosis of exclusion and I would not like to label the patient with this diagnosis in one ED visit. Besides, she does have swelling and aphthous ulcers present. However, the pain not getting better with the strongest of painkillers is a point in its favor.   For now, we have discharged the patient on tramadol. She did not seem to benefit from morphine, percocet, gabapentin and dilaudid given to her in ED. She might benefit from prednisone if this is arthritic pathology and or related to SLE/Behcets. She needs close out patient follow up to determine the etiology of this pain. We have booked her for a visit in the [facility] clinic shortly.    III. Depression and Anxiety: She needs to be followed up by a mental heath professional and we will try to get her into a facility upon her clinic visit. For now, I am okay discharging her on alprazolam, just enough quantity to tide her over until her clinic visit. I will refrain from starting an SSRI just yet given the suspicion of reaction from them.      IV: Diabetes: A1c is pending. We will restart metformin for now and follow in clinic.   V. Aphthous Ulcers: Aphthasol paste to apply. Avoid spicy food. Warm saline gargles.  I thank Dr. Allena Katz for working with me on this patien's care.  Thanks, Aletta Edouard, MD 12/12/201312:31 PM

## 2012-05-21 NOTE — ED Notes (Signed)
Internal medicine at bedside speaking with patient

## 2012-05-21 NOTE — Consult Note (Signed)
ED consult Note Date: 05/21/2012  [name] [name] name: [name] [name] Medical record number: [id number] Date of birth: [birth year] Age: 41 y.o. Gender: female PCP: Clementeen Graham, MD  Medical Service: Internal medicine teaching service  Attending physician: Dr. Dalphine Handing    1st Contact:     Pager: 2nd Contact:     Pager: After 5 pm or weekends: 1st Contact:      Pager: [phone] 2nd Contact:      Pager: [phone]  Chief Complaint: Bilateral hand and finger pain.  History of Present Illness: [name] [name] is a 41 year woman with past history of bipolar disorder, diabetes who comes to the ED initially with asthma attack which was resolved with nebulizers per ED. After that she complained of bilateral hand and finger pain for past 5 days also. Sharp shooting severe pain,  10/10 in severity, mostly stays in the hand but radiates up to forearm when she sleeps. No associated neck pain or stiffness or shoulder pains or upper arm pain. No recent trauma. No associated joint swelling.  [name] [name] does complain of associated rash in her hands and forearm associated with the pain.  Inciting event unclear, [name] [name] woke up with pain about 5 days before. She reports having trouble sleeping due to pain. Although she was started on Cymbalta about 3 weeks before. She stopped taking Cymbalta after she started having pain. She is a reports having tongue ulcers daily as before which resolved. She denies any trouble eating, drinking, following. Denies any chest pain, pain anywhere else, leg pain, short of breath, abdominal pain, diarrhea. She does complain of vaginal itching. Pelvic exam in ED shows candidal rash without any discharge. Wet prep collected.  [name] [name]'s anxious and intermittently crying due to pain and depression. She does not follow with a primary doctor. Used to follow with family practice but stopped seeing them for last 2 and half years. She has history of diabetes but is not on any medication for past 2 years or so.   She was not on a depression medication for about a year and was just started on Cymbalta recently by her boyfriend's doctor, who she saw about 3 weeks before.    Meds: Current Outpatient Rx  Name  Route  Sig  Dispense  Refill  . TYLENOL PO   Oral   Take 1 tablet by mouth every 6 (six) hours as needed. For pain         . ASPIRIN PO   Oral   Take 1 tablet by mouth daily as needed. For pain         . DULOXETINE HCL 60 MG PO CPEP   Oral   Take 60 mg by mouth daily.         . IBUPROFEN PO   Oral   Take 1 tablet by mouth every 6 (six) hours as needed. For pain         . ALBUTEROL SULFATE (2.5 MG/3ML) 0.083% IN NEBU   Nebulization   Take 2.5 mg by nebulization once.         . ALBUTEROL 90 MCG/ACT IN AERS   Inhalation   Inhale 1 puff into the lungs. every 4-6 hours as needed           Allergies: Allergies as of 05/21/2012  . (No Known Allergies)   Past Medical History  Diagnosis Date  . Diabetes mellitus   . Asthma   . Anxiety disorder   . Bipolar affective disorder    Past Surgical History  Procedure Date  . Knee arthroscopy   . Tubal ligation    No family history on file. History   Social History  . Marital Status: Divorced    Spouse Name: N/A    Number of Children: N/A  . Years of Education: N/A   Occupational History  . Not on file.   Social History Main Topics  . Smoking status: Current Every Day Smoker  . Smokeless tobacco: Not on file  . Alcohol Use: No  . Drug Use: No  . Sexually Active: Yes    Birth Control/ Protection: Surgical   Other Topics Concern  . Not on file   Social History Narrative   [name] [name] lives in [place] with her 2 kids- 100 in 66 years old.She has total 4 kids.She is a single mom.    Review of Systems: As per history of present illness.  Physical Exam: Blood pressure 150/85, pulse 114, temperature 98.1 F (36.7 C), temperature source Oral, resp. rate 20, SpO2 97.00%. Constitutional: Vital signs  reviewed.  [name] [name] is obese, soft and intermittently and anxious although cooperative with exam. Alert and oriented x3.  Head: Normocephalic and atraumatic Mouth: no erythema or exudates, MMM. Small oral aphthous ulcers- tongue. Eyes: PERRL, EOMI, conjunctivae normal, No scleral icterus.  Neck: Supple, Trachea midline normal ROM, No JVD Cardiovascular: Tachycardic, S2 normal Pulmonary/Chest: CTAB, no wheezes Abdominal: Soft. Non-tender, non-distended, bowel sounds are normal  Musculoskeletal: No joint deformities, or stiffness, ROM full and no nontender. Mild B/L hand swelling. Strength is normal and symmetric bilaterally, cranial nerve II-XII are grossly intact, no focal motor deficit, sensory intact to light touch bilaterally.  normal sensation bilateral hands and forearms, mildly decreased pinprick at fingertips. Skin: Warm, dry and intact. No rash, cyanosis, or clubbing.  Psychiatric: Anxious and sobbing. Feels and looks depressed.    Lab results: Basic Metabolic Panel:  [facility] 05/21/12 0421  NA 137  K 3.2*  CL 100  CO2 23  GLUCOSE 387*  BUN 5*  CREATININE 0.53  CALCIUM 8.9  MG --  PHOS --   Liver Function Tests:  [facility] 05/21/12 0421  AST 16  ALT 17  ALKPHOS 164*  BILITOT 0.2*  PROT 6.5  ALBUMIN 3.5   CBC:  [facility] 05/21/12 0421  WBC 5.8  NEUTROABS 3.0  HGB 12.7  HCT 37.5  MCV 83.9  PLT 194   Urine Drug Screen: Drugs of Abuse     Component Value Date/Time   LABOPIA NONE DETECTED 06/25/2010 0204   COCAINSCRNUR NONE DETECTED 06/25/2010 0204   LABBENZ NONE DETECTED 06/25/2010 0204   AMPHETMU NONE DETECTED 06/25/2010 0204   THCU NONE DETECTED 06/25/2010 0204   LABBARB  Value: NONE DETECTED        DRUG SCREEN FOR MEDICAL PURPOSES ONLY.  IF CONFIRMATION IS NEEDED FOR ANY PURPOSE, NOTIFY LAB WITHIN 5 DAYS.        LOWEST DETECTABLE LIMITS FOR URINE DRUG SCREEN Drug Class       Cutoff (ng/mL) Amphetamine      1000 Barbiturate      200 Benzodiazepine   200  Tricyclics       300 Opiates          300 Cocaine          300 THC              50 06/25/2010 0204     Imaging results:  No results found.  Assessment & Plan by Problem: Principal Problem:  *Bilateral hand pain  Active Problems:  DM  BIPOLAR AFFECTIVE DISORDER  1) Bilateral hand pain: Unclear etiology. There was a question of Stevens-Johnson syndrome due to reaction to Cymbalta. [name] [name] does have mild rash on her hands, but no skin breaking or ulcers. Also she has aphthous tongue ulcers. She does not have fever or any vital signs abnormalities. Her white count is within normal limits.  She does not have any joint swelling or tenderness in her wrist, fingers, elbow, shoulder. Reaction to cymbalta vs.Neuropathic pain from diabetes versus conversion disorder (worsening anxiety and depression) on differential.   Plan - discharge [name] [name] home from ED with one time clinic followup appointment on 06/01/2012 with Dr. Dorthula Rue at 3:15 PM. - [name] [name] to be started on Neurontin 300 mg daily and titrated up weekly. - Stop Cymbalta. - Prednisone taper ( as unclear etiology of rash and hand pain) -  ESR 10. RPR, HIV pending. - Bilateral hand xray to evaluate for arthritis. - Lidocaine oral gel for aphthous ulcers.  2) Bipolar affective disorder:  [name] [name] with long-standing bipolar disorder since she was in her early 54s. She lost followup with primary doctor for about 2 and half years now. Not on any medications for about a year and just recently started on Cymbalta.  - Xanax 0.5 mg twice daily to bridge and then start SSRI while seen in clinic. - Regular followup with a primary Dr. and possible psychiatrist will be imperative. Discussed this with [name] [name] in detail.   3) DM 2: Uncontrolled. CBGs 380 in ED. Not on any medication for past 2 years. - Hemoglobin A1c - Start her back on metformin 500 mg twice daily and titrate up to 1000 mg twice daily. - Close outpatient followup.  4) Vaginal  itching: Pelvic exam shows candidial rash. No discharge. - Fluconazole 150 mg given in ED.  Signed: Taraji Mungo 05/21/2012, 10:45 AM

## 2012-05-21 NOTE — ED Notes (Addendum)
Patient stated she has hand pain that started 5 days ago.  Left hand more swollen then the right hand, redness noted around the nails on her left hand and scab on the middle finger.   Also c/o headache for the last 3 days.  EMS adm albuterol treatment enroute.  No wheezing noted.  Also c/o bumps on her tongue and itching and burning to her vaginal.

## 2012-05-21 NOTE — ED Notes (Signed)
Patient arrived via EMS with c/o difficulty breathing.  Albuterol 5mg  given by EMS and all wheezing cleared (initially wheezing was throughout all lung fields)

## 2012-05-22 ENCOUNTER — Encounter (HOSPITAL_COMMUNITY): Payer: Self-pay | Admitting: Emergency Medicine

## 2012-05-22 ENCOUNTER — Emergency Department (HOSPITAL_COMMUNITY)
Admission: EM | Admit: 2012-05-22 | Discharge: 2012-05-22 | Discharge status: Against Medical Advice | Payer: Medicaid Other | Attending: Emergency Medicine | Admitting: Emergency Medicine

## 2012-05-22 DIAGNOSIS — R51 Headache: Secondary | ICD-10-CM | POA: Insufficient documentation

## 2012-05-22 NOTE — ED Notes (Addendum)
Pt wanting to leave due to wait.  Delay explained to pt.  Pt states she is a new diabetic and does not have a meter at home.  States her friend checked her blood sugar at home and it was over 500.  Pt reports that her ride is here to get her.  Explained triage process to pt and encouraged her multiple times to stay.  Pt informed of blood sugar and she states she has to leave.  Barbara Cower, Consulting civil engineer notified of same.

## 2012-05-22 NOTE — ED Notes (Addendum)
C/o bilateral hand pain/fingers swelling x 7 days and headache x 5 days.  Pt seen in ED yesterday for same symptoms.  Also seen for sob but states that she is now breathing fine.  Pt states she has not slept for 10 days.  Reports taking 2 Phenergan suppositories prior to arrival because she states she was "dry heaving coke." States she did not remember any drug allergies yesterday when she was seen.  Now remembers that she is allergic to Tramadol and received a Rx for it yesterday.  States, "Tramadol makes me feel like I am on fire."

## 2012-05-23 ENCOUNTER — Emergency Department (HOSPITAL_COMMUNITY)
Admission: EM | Admit: 2012-05-23 | Discharge: 2012-05-23 | Disposition: A | Discharge status: Home / Self Care | Payer: Medicaid Other | Attending: Emergency Medicine | Admitting: Emergency Medicine

## 2012-05-23 ENCOUNTER — Encounter (HOSPITAL_COMMUNITY): Payer: Self-pay | Admitting: Physical Medicine and Rehabilitation

## 2012-05-23 DIAGNOSIS — F17200 Nicotine dependence, unspecified, uncomplicated: Secondary | ICD-10-CM | POA: Insufficient documentation

## 2012-05-23 DIAGNOSIS — J45909 Unspecified asthma, uncomplicated: Secondary | ICD-10-CM | POA: Insufficient documentation

## 2012-05-23 DIAGNOSIS — R209 Unspecified disturbances of skin sensation: Secondary | ICD-10-CM | POA: Insufficient documentation

## 2012-05-23 DIAGNOSIS — I10 Essential (primary) hypertension: Secondary | ICD-10-CM | POA: Insufficient documentation

## 2012-05-23 DIAGNOSIS — M2559 Pain in other specified joint: Secondary | ICD-10-CM | POA: Insufficient documentation

## 2012-05-23 DIAGNOSIS — IMO0001 Reserved for inherently not codable concepts without codable children: Secondary | ICD-10-CM | POA: Insufficient documentation

## 2012-05-23 DIAGNOSIS — M79609 Pain in unspecified limb: Secondary | ICD-10-CM | POA: Insufficient documentation

## 2012-05-23 DIAGNOSIS — M79641 Pain in right hand: Secondary | ICD-10-CM

## 2012-05-23 DIAGNOSIS — Z8619 Personal history of other infectious and parasitic diseases: Secondary | ICD-10-CM | POA: Insufficient documentation

## 2012-05-23 DIAGNOSIS — E669 Obesity, unspecified: Secondary | ICD-10-CM | POA: Insufficient documentation

## 2012-05-23 DIAGNOSIS — E119 Type 2 diabetes mellitus without complications: Secondary | ICD-10-CM

## 2012-05-23 DIAGNOSIS — Z8659 Personal history of other mental and behavioral disorders: Secondary | ICD-10-CM | POA: Insufficient documentation

## 2012-05-23 DIAGNOSIS — Z79899 Other long term (current) drug therapy: Secondary | ICD-10-CM | POA: Insufficient documentation

## 2012-05-23 LAB — URINALYSIS, ROUTINE W REFLEX MICROSCOPIC
Glucose, UA: >1000 mg/dL — AB
Ketones, ur: 15 mg/dL — AB
Protein, ur: NEGATIVE mg/dL
Urobilinogen, UA: 0.2 mg/dL (ref 0.0–1.0)

## 2012-05-23 LAB — COMPREHENSIVE METABOLIC PANEL
Albumin: 3.8 g/dL (ref 3.5–5.2)
Alkaline Phosphatase: 118 U/L — ABNORMAL HIGH (ref 39–117)
BUN: 10 mg/dL (ref 6–23)
CO2: 22 mEq/L (ref 19–32)
Chloride: 95 mEq/L — ABNORMAL LOW (ref 96–112)
Creatinine, Ser: 0.56 mg/dL (ref 0.50–1.10)
GFR calc non Af Amer: >90 mL/min (ref >90)
Potassium: 3.6 mEq/L (ref 3.5–5.1)
Total Bilirubin: 0.2 mg/dL — ABNORMAL LOW (ref 0.3–1.2)

## 2012-05-23 LAB — CBC WITH DIFFERENTIAL/PLATELET
Basophils Absolute: 0.1 10*3/uL (ref 0.0–0.1)
Eosinophils Relative: 1 % (ref 0–5)
HCT: 39.1 % (ref 36.0–46.0)
Hemoglobin: 13.2 g/dL (ref 12.0–15.0)
Lymphocytes Relative: 44 % (ref 12–46)
Lymphs Abs: 3.3 10*3/uL (ref 0.7–4.0)
MCV: 85 fL (ref 78.0–100.0)
Monocytes Absolute: 0.5 10*3/uL (ref 0.1–1.0)
Monocytes Relative: 6 % (ref 3–12)
Neutro Abs: 3.5 10*3/uL (ref 1.7–7.7)
RBC: 4.6 MIL/uL (ref 3.87–5.11)
RDW: 13.4 % (ref 11.5–15.5)
WBC: 7.6 10*3/uL (ref 4.0–10.5)

## 2012-05-23 LAB — ETHANOL: Alcohol, Ethyl (B): <11 mg/dL (ref 0–11)

## 2012-05-23 LAB — GLUCOSE, CAPILLARY
Glucose-Capillary: 347 mg/dL — ABNORMAL HIGH (ref 70–99)
Glucose-Capillary: 321 mg/dL — ABNORMAL HIGH (ref 70–99)

## 2012-05-23 LAB — RAPID URINE DRUG SCREEN, HOSP PERFORMED
Amphetamines: NOT DETECTED
Tetrahydrocannabinol: NOT DETECTED

## 2012-05-23 MED ORDER — OXYCODONE-ACETAMINOPHEN 5-325 MG PO TABS: ORAL_TABLET | ORAL | Status: DC

## 2012-05-23 MED ORDER — IBUPROFEN 200 MG PO TABS
400.0000 mg | ORAL_TABLET | Freq: Once | ORAL | Status: AC
  Administered 2012-05-23: 400 mg via ORAL
  Filled 2012-05-23: qty 2

## 2012-05-23 MED ORDER — SODIUM CHLORIDE 0.9 % IV BOLUS (SEPSIS)
1000.0000 mL | Freq: Once | INTRAVENOUS | Status: AC
  Administered 2012-05-23: 1000 mL via INTRAVENOUS

## 2012-05-23 MED ORDER — ONDANSETRON HCL 4 MG/2ML IJ SOLN
4.0000 mg | Freq: Once | INTRAMUSCULAR | Status: AC
  Administered 2012-05-23: 4 mg via INTRAVENOUS
  Filled 2012-05-23: qty 2

## 2012-05-23 MED ORDER — LIDOCAINE VISCOUS 2 % MT SOLN
20.0000 mL | Freq: Once | OROMUCOSAL | Status: AC
  Administered 2012-05-23: 20 mL via OROMUCOSAL
  Filled 2012-05-23 (×2): qty 15

## 2012-05-23 MED ORDER — HYDROMORPHONE HCL PF 1 MG/ML IJ SOLN
1.0000 mg | Freq: Once | INTRAMUSCULAR | Status: AC
  Administered 2012-05-23: 1 mg via INTRAVENOUS
  Filled 2012-05-23: qty 1

## 2012-05-23 MED ORDER — MAGIC MOUTHWASH W/LIDOCAINE: 5.0000 mL | Freq: Four times a day (QID) | ORAL | Status: DC

## 2012-05-23 MED ORDER — LIDOCAINE VISCOUS 2 % MT SOLN: 15.0000 mL | OROMUCOSAL | Status: DC | PRN

## 2012-05-23 MED ORDER — SULFAMETHOXAZOLE-TRIMETHOPRIM 800-160 MG PO TABS: 1.0000 | ORAL_TABLET | Freq: Two times a day (BID) | ORAL | Status: DC

## 2012-05-23 MED ORDER — DIPHENHYD-HYDROCORT-NYSTATIN MT SUSP: OROMUCOSAL | Status: DC

## 2012-05-23 NOTE — ED Provider Notes (Signed)
History     CSN: [id number]  Arrival date & time 05/23/12  1641   First MD Initiated Contact with Patient 05/23/12 1719      Chief Complaint  Patient presents with  . Arm Pain    (Consider location/radiation/quality/duration/timing/severity/associated sxs/prior treatment) HPI  [name] [name] is a 41 y.o. female with past medical history significant for bipolar affective disorder, anxiety and depression, uncontrolled diabetes mellitus, and medical noncompliance presenting today with pain and paresthesia to bilateral hands right worse than the left. She states the pain is severe at 10 out of 10 it starts in the fingertips and toes up to the elbow. States she hasn't sleep in 10 days. She is a poor historian and cannot tell if she's been taking any pain medication. She denies any trauma. Patient was seen here for similar several days ago. Per notes from that time patient had extensive aphthous ulcers it was felt that this may have been related to Stevens-Johnson syndrome from her recently started and DC'd Cymbalta. Patient has also had recurrent yeast infections and states that she was given a medication for thrush but has not been able to get filled because he didn't have any pharmacy.  Past Medical History  Diagnosis Date  . Diabetes mellitus   . Asthma   . Anxiety disorder   . Bipolar affective disorder     Past Surgical History  Procedure Date  . Knee arthroscopy   . Tubal ligation     No family history on file.  History  Substance Use Topics  . Smoking status: Current Every Day Smoker  . Smokeless tobacco: Not on file  . Alcohol Use: No    OB History    Grav Para Term Preterm Abortions TAB SAB Ect Mult Living                  Review of Systems  Constitutional: Negative for fever.  Respiratory: Negative for shortness of breath.   Cardiovascular: Negative for chest pain.  Gastrointestinal: Negative for nausea, vomiting, abdominal pain and diarrhea.   Musculoskeletal: Positive for arthralgias.  All other systems reviewed and are negative.    Allergies  Tramadol  Home Medications   Current Outpatient Rx  Name  Route  Sig  Dispense  Refill  . ALBUTEROL SULFATE HFA 108 (90 BASE) MCG/ACT IN AERS   Inhalation   Inhale 2 puffs into the lungs every 6 (six) hours as needed for wheezing.   1 Inhaler   2   . ALBUTEROL 90 MCG/ACT IN AERS   Inhalation   Inhale 1 puff into the lungs. every 4-6 hours as needed         . ALPRAZOLAM 0.5 MG PO TABS   Oral   Take 1 tablet (0.5 mg total) by mouth 2 (two) times daily as needed for sleep or anxiety.   30 tablet   0   . AMLEXANOX 5 % MT PSTE   Mouth/Throat   Use as directed in the mouth or throat 4 (four) times daily. Use 1/4" ribbon directly on oral ulcers   5 g   2   . ASPIRIN PO   Oral   Take 1 tablet by mouth daily as needed. For pain         . GABAPENTIN 300 MG PO CAPS   Oral   Take 1 capsule (300 mg total) by mouth daily.   90 capsule   0   . IBUPROFEN PO   Oral  Take 1 tablet by mouth every 6 (six) hours as needed. For pain         . METFORMIN HCL 500 MG PO TABS   Oral   Take 1 tablet (500 mg total) by mouth 2 (two) times daily with a meal.   60 tablet   5   . PREDNISONE 10 MG PO TABS      20 mg daily for 5 days, then 10 mg daily for next 5 days and then stop.   15 tablet   0   . TRAMADOL HCL 50 MG PO TABS   Oral   Take 1 tablet (50 mg total) by mouth every 8 (eight) hours as needed for pain.   30 tablet   0     BP 161/116  Pulse 120  Temp 98.3 F (36.8 C) (Oral)  Resp 20  SpO2 97%  LMP 05/22/2012  Physical Exam  Nursing note and vitals reviewed. Constitutional: She is oriented to person, place, and time. She appears well-developed and well-nourished. No distress.       Obese  HENT:  Head: Normocephalic and atraumatic.  Right Ear: External ear normal.       Dry mucous membranes, white patches to tongue.  Eyes: Conjunctivae normal and  EOM are normal. Pupils are equal, round, and reactive to light.  Neck: Normal range of motion.  Cardiovascular: Normal rate, regular rhythm and intact distal pulses.   Pulmonary/Chest: Effort normal and breath sounds normal. No stridor. No respiratory distress. She has no wheezes. She has no rales. She exhibits no tenderness.  Abdominal: Soft. Bowel sounds are normal. She exhibits no distension and no mass. There is no tenderness. There is no rebound and no guarding.  Musculoskeletal: Normal range of motion.       Tinel and Phalen tests negative  Neurological: She is alert and oriented to person, place, and time.       Strength is 5 out of 5x4 extremities distal sensation is grossly intact to all digits of bilateral upper arms.  Skin: No rash noted.  Psychiatric: She has a normal mood and affect.    ED Course  Procedures (including critical care time)  Labs Reviewed - No data to display No results found.   1. Bilateral hand pain   2. DM   3. OBESITY   4. ESSENTIAL HYPERTENSION   5. Disturbance of skin sensation   6. TOBACCO USE       MDM  Patient with bilateral upper extremity paresthesia. Patient is diabetic and has been noncompliant with her medication for several years. She is having repeated yeast infection and oropharyngeal thrush. Patient reports polyuria and polydipsia she is tachycardic likely secondary to dehydration.  Burning sensation in her hands may be a diabetic neuropathy.  Hemoglobin A1C is 12.6. HIV, RPR GC and Chlamydia are all negative.  Urinalysis is not inconsistent with a UTI. I will treat her with a short course of Bactrim.  Discussed case with attending who agrees with plan and stability to d/c to home.    Pt verbalized understanding and agrees with care plan. Outpatient follow-up and return precautions given.    New Prescriptions   DIPHENHYD-HYDROCORT-NYSTATIN SUSP    Swish, gargle, and spit 2 teaspoonsful every 6 hours   LIDOCAINE (XYLOCAINE) 2 %  SOLUTION    Take 15 mLs by mouth every 3 (three) hours as needed for pain.   OXYCODONE-ACETAMINOPHEN (PERCOCET/ROXICET) 5-325 MG PER TABLET    1 to 2 tabs PO  q6hrs  PRN for pain   SULFAMETHOXAZOLE-TRIMETHOPRIM (BACTRIM DS,SEPTRA DS) 800-160 MG PER TABLET    Take 1 tablet by mouth 2 (two) times daily. One po bid x 3 days      Wynetta Emery, PA-C 05/24/12 1807

## 2012-05-23 NOTE — ED Notes (Signed)
CBG was 347 

## 2012-05-23 NOTE — ED Notes (Signed)
Pt complaining of sever pain in her hand and its been chronic pain.

## 2012-05-23 NOTE — ED Notes (Signed)
CBG was 321

## 2012-05-23 NOTE — ED Notes (Signed)
Discussed extensively with patient the importance of diabetes teaching , diabetic diets, exercise, and checking her blood sugar.

## 2012-05-23 NOTE — ED Notes (Addendum)
Pt reports migraine today also. Pt has slurred speech and slow to respond to questions. Pt reports pain and numbness to her hands and arms. PT also reports she is unable to sleep for the past 10 days. Pt reports thrush like in her mouth.

## 2012-05-23 NOTE — ED Notes (Signed)
Patient just went to restroom to void.

## 2012-05-23 NOTE — ED Notes (Addendum)
Pt presents to department for evaluation of numbness/tingling to bilateral fingertip and pain shooting up both arms. Ongoing x2 weeks. States she was seen for same recently, but let AMA. States that she was told to stop taking Cymbalta due to possible reaction to medication. 8/10 pain at the time. Pt states pain becomes worse when attempting to grip objects and make fist. She is alert and oriented x4. NAD.

## 2012-05-24 NOTE — ED Provider Notes (Signed)
Medical screening examination/treatment/procedure(s) were performed by non-physician practitioner and as supervising physician I was immediately available for consultation/collaboration.   Charles B. Sheldon, MD 05/24/12 2137 

## 2012-06-01 ENCOUNTER — Ambulatory Visit (INDEPENDENT_AMBULATORY_CARE_PROVIDER_SITE_OTHER): Payer: Medicaid Other | Admitting: Internal Medicine

## 2012-06-01 ENCOUNTER — Ambulatory Visit: Payer: Medicaid Other | Admitting: Internal Medicine

## 2012-06-01 VITALS — BP 145/81 | HR 116 | Temp 98.3°F | Wt 206.7 lb

## 2012-06-01 DIAGNOSIS — M79609 Pain in unspecified limb: Secondary | ICD-10-CM

## 2012-06-01 DIAGNOSIS — K120 Recurrent oral aphthae: Secondary | ICD-10-CM

## 2012-06-01 DIAGNOSIS — F319 Bipolar disorder, unspecified: Secondary | ICD-10-CM

## 2012-06-01 DIAGNOSIS — M79641 Pain in right hand: Secondary | ICD-10-CM

## 2012-06-01 DIAGNOSIS — E119 Type 2 diabetes mellitus without complications: Secondary | ICD-10-CM

## 2012-06-01 DIAGNOSIS — F419 Anxiety disorder, unspecified: Secondary | ICD-10-CM

## 2012-06-01 MED ORDER — GABAPENTIN 300 MG PO CAPS: 300.0000 mg | ORAL_CAPSULE | Freq: Every day | ORAL | Status: DC

## 2012-06-01 MED ORDER — METFORMIN HCL 1000 MG PO TABS: 1000.0000 mg | ORAL_TABLET | Freq: Two times a day (BID) | ORAL | Status: DC

## 2012-06-01 MED ORDER — ALPRAZOLAM 0.5 MG PO TABS: 0.5000 mg | ORAL_TABLET | Freq: Two times a day (BID) | ORAL | Status: DC | PRN

## 2012-06-01 MED ORDER — GLIPIZIDE 10 MG PO TABS: 10.0000 mg | ORAL_TABLET | Freq: Two times a day (BID) | ORAL | Status: DC

## 2012-06-01 NOTE — Patient Instructions (Addendum)
Please schedule a follow up appointment in 3 weeks. Please bring your medication bottles with your next appointment. Please take your medicines as prescribed.

## 2012-06-01 NOTE — Progress Notes (Signed)
Subjective:   Patient ID: [name] [name] female   DOB: Mar 26, 1971 41 y.o.   MRN: [id number]  HPI: 41 year old woman with past medical history significant for type 2 diabetes mellitus, medication none adherence, bipolar affective disorder comes to the clinic for ER followup.  Patient used to follow up with family medicine about a while ago but has not been seen in their clinic for almost last 2 years. She does not have a PCP but saw some health care provider ( her sister's PCP) about 42-42 weeks ago for her bipolar disease.  Patient was recently seen in the ED on 05/21/2012 for bilateral hand pain, aphthous ulcers and anxiety. Patient reports that she was fine until 3-4 weeks ago when she started having bilateral hand pain. She relates it to the new medication Cymbalta that was started about 42-42 weeks ago for her bipolar disorder . She states that about 3-4 weeks ago ( ~ time of ED visit)- she woke up one morning and noticed redness in her both hands and arms. This was associated with sharp pain in her hands along with swelling, tingling and numbness. She thought it was related to her new medication and she stopped taking that. Patient came to the ER on 05/21/2012 for her bilateral hand pain. At that time patient was also noticed to have aphthous ulcers on exam and diagnoses of Trudie Buckler syndrome as a rare side effect of Cymbalta was considered in the differential. No skin lesions were noticed on exam at that time. Internal medicine teaching service was consulted to evaluate the patient. Bilateral hand x-rays were obtained which did not show any evidence of arthritis. Behcet was also in the differential but patient denied history of any genital ulcers and also no new ulcers were noticed on the genital exam .She was given prescriptions for few medications including metformin, bactrim, fluconazole and  30 tablets of Xanax for her anxiety/bipolar disorder  with a followup appointment scheduled with outpatient  clinic. As of today ,  She continues to complain of bilateral hand pain-a states that her pain starts in the first 3 digits of both the hands and shoots up  through her forearm up to the arm, associated with some tingling and numbness in the first 3 digits of both the hands. She also reports that her mother had a history of carpal tunnel syndrome and she had to undergo hand surgery . Although a prescription for Neurontin was printed at the time of her ED visit but apparently patient and her daughter states that they never got any prescription for Neurontin. She has never done any job that involved lot of typing.   She states that anxiety presents are helping her to sleep and was requesting more refills. Patient also reports noticing some stinking odor in her pelvic and perirectal area but denies noticing any ulcers today. She denies any vaginal discharge. She was given a tablet of fluconazole and three-day course of Bactrim for possible UTI from the ER. She refuses pelvic exam today.  DM: Patient does not seem to be having a primary care provider. She has not seen in a physician for last few years. She does not check her blood sugars and was not taking any medications for several months before this ED visit    Past Medical History  Diagnosis Date  . Diabetes mellitus   . Asthma   . Anxiety disorder   . Bipolar affective disorder    No family history on file. History  Social History  . Marital Status: Divorced    Spouse Name: N/A    Number of Children: N/A  . Years of Education: N/A   Occupational History  . Not on file.   Social History Main Topics  . Smoking status: Current Every Day Smoker  . Smokeless tobacco: Not on file  . Alcohol Use: No  . Drug Use: No  . Sexually Active: Yes    Birth Control/ Protection: Surgical   Other Topics Concern  . Not on file   Social History Narrative   Patient lives in [place] with her 2 kids- 24 in 39 years old.She has total 4 kids.She is  a single mom.   Review of Systems: General: Denies fever, chills, diaphoresis, appetite change and fatigue. HEENT: Denies photophobia, eye pain, redness, hearing loss, ear pain, congestion, sore throat, rhinorrhea, sneezing, mouth sores, trouble swallowing, neck pain, neck stiffness and tinnitus. Respiratory: Denies SOB, DOE, cough, chest tightness, and wheezing. Cardiovascular: Denies to chest pain, palpitations and leg swelling. Gastrointestinal: Denies nausea, vomiting, abdominal pain, diarrhea, constipation, blood in stool and abdominal distention. Genitourinary: Denies dysuria, urgency, frequency, hematuria, flank pain and difficulty urinating. Musculoskeletal: Denies myalgias, back pain, joint swelling, arthralgias and gait problem.  Skin: Denies pallor, rash and wound. Neurological: Denies dizziness, seizures, syncope, weakness, light-headedness, numbness and headaches. Hematological: Denies adenopathy, easy bruising, personal or family bleeding history. Psychiatric/Behavioral: Denies suicidal ideation, mood changes, confusion, nervousness, + sleep disturbance, anxious appearing  and agitation.    Current Outpatient Medications: Current Outpatient Prescriptions  Medication Sig Dispense Refill  . albuterol (PROVENTIL HFA;VENTOLIN HFA) 108 (90 BASE) MCG/ACT inhaler Inhale 2 puffs into the lungs every 6 (six) hours as needed for wheezing.  1 Inhaler  2  . ALPRAZolam (XANAX) 0.5 MG tablet Take 1 tablet (0.5 mg total) by mouth 2 (two) times daily as needed for sleep or anxiety.  20 tablet  0  . aspirin 325 MG tablet Take 325 mg by mouth every 6 (six) hours as needed. For pain      . Diphenhyd-Hydrocort-Nystatin SUSP Swish, gargle, and spit 2 teaspoonsful every 6 hours  1 Bottle  0  . gabapentin (NEURONTIN) 300 MG capsule Take 1 capsule (300 mg total) by mouth daily.  90 capsule  0  . ibuprofen (ADVIL,MOTRIN) 200 MG tablet Take 800 mg by mouth every 6 (six) hours as needed. For pain      .  lidocaine (XYLOCAINE) 2 % solution Take 15 mLs by mouth every 3 (three) hours as needed for pain.  100 mL  0  . metFORMIN (GLUCOPHAGE) 1000 MG tablet Take 1 tablet (1,000 mg total) by mouth 2 (two) times daily with a meal.  60 tablet  3  . oxyCODONE-acetaminophen (PERCOCET/ROXICET) 5-325 MG per tablet 1 to 2 tabs PO q6hrs  PRN for pain  15 tablet  0  . predniSONE (DELTASONE) 10 MG tablet 20 mg daily for 5 days, then 10 mg daily for next 5 days and then stop.  15 tablet  0  . sulfamethoxazole-trimethoprim (BACTRIM DS,SEPTRA DS) 800-160 MG per tablet Take 1 tablet by mouth 2 (two) times daily. One po bid x 3 days  6 tablet  0  . [DISCONTINUED] DULoxetine (CYMBALTA) 60 MG capsule Take 60 mg by mouth daily.      . [DISCONTINUED] ergocalciferol (VITAMIN D2) 50000 UNIT capsule Take 50,000 Units by mouth once a week. Take for 12 weeks       . [DISCONTINUED] lamoTRIgine (LAMICTAL) 25 MG tablet  Take 1 tab by mouth daily x 2 weeks, then 2 by mouth daily x 2 weeks, then 4 by mouth daily.      . [DISCONTINUED] lisinopril-hydrochlorothiazide (PRINZIDE,ZESTORETIC) 20-12.5 MG per tablet Take 1 tablet by mouth daily.        . [DISCONTINUED] omeprazole (PRILOSEC) 20 MG capsule Take 20 mg by mouth daily.        . [DISCONTINUED] QUEtiapine (SEROQUEL) 100 MG tablet Take 100 mg by mouth daily.         Allergies: Allergies  Allergen Reactions  . Tramadol     "feeling like I'm on fire"      Objective:   Physical Exam: Filed Vitals:   06/01/12 1534  BP: 145/81  Pulse: 116  Temp: 98.3 F (36.8 C)    General: Vital signs reviewed and noted. Well-developed, well-nourished, in no acute distress; alert, appropriate and cooperative throughout examination. HEENT: Normocephalic, atraumatic,  Few ( 3-4) aphthous ulcers + on the left side of the tongue Lungs: Normal respiratory effort. Clear to auscultation BL without crackles or wheezes. Heart: RRR. S1 and S2 normal without gallop, murmur, or rubs. Abdomen:BS  normoactive. Soft, Nondistended, non-tender.  No masses or organomegaly. Extremities: No pretibial edema. Neuro: cranial nerves II- XII intact, motor strength - 5/5 in all four extremities, full hand grip, sensations intact to light touch. Phalen and Tinel test negative Pelvic exam: patient refuses pelvic exam today. Pelvic exam in the ED showed candidal rash. No wet prep specimen has been reported     Assessment & Plan:

## 2012-06-01 NOTE — Assessment & Plan Note (Signed)
Her AIC was 12.6  with the ED visit - patient was started on metformin 500 BID with the plans to titrate up to 1000 mg BID. She was still taking metformin 500 mg BID. She is not willing to be started on insulin. After discussing her case with Dr. Meredith Pel, we decided to titrate up on metformin for now. Would add glipizide with next clinic appointment.

## 2012-06-04 NOTE — Assessment & Plan Note (Signed)
Patient complains of bilateral hand pain in the distribution of median nerve involving the first 3 digits of both the hands associated with tingling and numbness which is worse at night and sometimes makes her up from sleep. Although on her clinical exam phalen and Tinnel test are negative ( sensitivity and specificity is moderate) but still carpal tunnel is high on differential. Other possibility includes diabetic neuropathy. In the ED she had workup including x-rays of the hand which did not show any evidence of arthritis. She was checked for GC, chlamydia, HIV which were all negative. Her ESR was 10. She was also given a prescription for Neurontin but apparently patient and her daughter states they never got one. -Was given a handwritten prescription for  Wrist splints for both the hands and was instructed to definitely wear them at night. -She was also started on neurontin to see if that helps if this is related to diabetic neuropathy. If it is CTS, then probably she won't benefit from neurontin.  -Continue to follow and assess with future visits.

## 2012-06-05 DIAGNOSIS — K120 Recurrent oral aphthae: Secondary | ICD-10-CM | POA: Insufficient documentation

## 2012-06-05 NOTE — Assessment & Plan Note (Addendum)
Etiology unclear. DD includes vitamin deficiencies like B-12 vs emotional stress vs food or drug hypersensitivity vs herpes. They were thought to be the ? spectrum of Trudie Buckler in the ER related to her cymbalta in the ED. Behcet and other autoimmune disease were also in the differential but were thought to be less likely with ESR< 10 and no swollen joints/synovitis  or arthralgia except hand pain. Behcet is a possibility but appears less likely in the absence of any other findings like genital ulcers, occular involvement( ? Uveitis). Of note , patient refused pelvic exam today but she denies any genital sores or lesions and was also not found to have any genital lesions on pelvic exam done in the ER. She was tested negative for HIV. - Continue lidocaine gel.Would also give her aphthous ulcer paste. Unfortunately patient left the clinic before she could be handed over the prescription for aphthasol paste, so I sent it electronically to the pharmacy and left message over the phone for patient to pick up. She was not answering the phone.  - Obtain herpes cultures - Would continue to follow up on her ulcers with subsequent visits.   Update: oral herpes cultures were negative

## 2012-06-05 NOTE — Assessment & Plan Note (Signed)
Patient was given 30 tablets of Xanax at the time of discharge from the ED because of the possibility of Stevens-Johnson syndrome from Cymbalta. It was not thought to be safe to continue the patient on SSRI. Patient states that she has tried Prozac and Zoloft in the past without any problems. With this clinic visit as well patient was noticed to have some aphthous ulcers with no skin rash but we are also hesitant to start her on SSRIs with the holiday schedule. Also we do not feel comfortable managing her bipolar affective disorder. Patient was clearly explained that she would need to find a psychiatrist for the treatment of her bipolar affective disorder that would be managing her antipsychotic medications. We can get our social worker involved to provide her more assistance. Patient verbalizes understanding. -Would give her 20 more tablets of Xanax pills she is able to find a psychiatrist. -Would get a social worker consult to offer some more resources.

## 2012-06-06 ENCOUNTER — Encounter (HOSPITAL_COMMUNITY): Payer: Self-pay | Admitting: *Deleted

## 2012-06-06 ENCOUNTER — Emergency Department (HOSPITAL_COMMUNITY)
Admission: EM | Admit: 2012-06-06 | Discharge: 2012-06-07 | Disposition: A | Discharge status: Home / Self Care | Payer: Medicaid Other | Attending: Emergency Medicine | Admitting: Emergency Medicine

## 2012-06-06 ENCOUNTER — Emergency Department (HOSPITAL_COMMUNITY): Payer: Medicaid Other

## 2012-06-06 DIAGNOSIS — F319 Bipolar disorder, unspecified: Secondary | ICD-10-CM | POA: Insufficient documentation

## 2012-06-06 DIAGNOSIS — J45909 Unspecified asthma, uncomplicated: Secondary | ICD-10-CM | POA: Insufficient documentation

## 2012-06-06 DIAGNOSIS — F411 Generalized anxiety disorder: Secondary | ICD-10-CM | POA: Insufficient documentation

## 2012-06-06 DIAGNOSIS — M5412 Radiculopathy, cervical region: Secondary | ICD-10-CM | POA: Insufficient documentation

## 2012-06-06 DIAGNOSIS — R209 Unspecified disturbances of skin sensation: Secondary | ICD-10-CM | POA: Insufficient documentation

## 2012-06-06 DIAGNOSIS — Z79899 Other long term (current) drug therapy: Secondary | ICD-10-CM | POA: Insufficient documentation

## 2012-06-06 DIAGNOSIS — E119 Type 2 diabetes mellitus without complications: Secondary | ICD-10-CM | POA: Insufficient documentation

## 2012-06-06 DIAGNOSIS — F17200 Nicotine dependence, unspecified, uncomplicated: Secondary | ICD-10-CM | POA: Insufficient documentation

## 2012-06-06 DIAGNOSIS — R110 Nausea: Secondary | ICD-10-CM | POA: Insufficient documentation

## 2012-06-06 MED ORDER — AMLEXANOX 5 % MT PSTE: PASTE | Freq: Four times a day (QID) | OROMUCOSAL | Status: DC

## 2012-06-06 NOTE — ED Notes (Signed)
C/o BUE, arm & hand pain, also numbness to 1st 2nd & 3rd digts bilaterally, pain shoots up arm. onset 12/12. Describes as comes and goes. Sometimes neck and back pain. Has been seen for similar. Pt/family mentions spinal stenosis and carpal tunnel.swollen hands noted.

## 2012-06-07 MED ORDER — GABAPENTIN 100 MG PO CAPS: 100.0000 mg | ORAL_CAPSULE | ORAL | Status: DC

## 2012-06-07 MED ORDER — PROMETHAZINE HCL 25 MG PO TABS: 25.0000 mg | ORAL_TABLET | Freq: Four times a day (QID) | ORAL | Status: DC | PRN

## 2012-06-07 NOTE — ED Provider Notes (Signed)
Medical screening examination/treatment/procedure(s) were performed by non-physician practitioner and as supervising physician I was immediately available for consultation/collaboration.  Jones Skene, M.D.     Jones Skene, MD 06/07/12 [phone]

## 2012-06-07 NOTE — ED Notes (Signed)
Pt denies any questions upon discharge. 

## 2012-06-07 NOTE — ED Provider Notes (Signed)
History     CSN: [id number]  Arrival date & time 06/06/12  2156   First MD Initiated Contact with Patient 06/06/12 2331      Chief Complaint  Patient presents with  . Arm Pain  . Hand Pain  . Numbness    (Consider location/radiation/quality/duration/timing/severity/associated sxs/prior treatment) HPI Comments: Patient with persistent numbness and tingling of both hands and arms influenced by position Has been seen several times form same with treatments that intermittently have begun to help but not completely and patient frustrated without a definitive diagnosis.   The history is provided by the patient.    Past Medical History  Diagnosis Date  . Diabetes mellitus   . Asthma   . Anxiety disorder   . Bipolar affective disorder     Past Surgical History  Procedure Date  . Knee arthroscopy   . Tubal ligation     No family history on file.  History  Substance Use Topics  . Smoking status: Current Every Day Smoker  . Smokeless tobacco: Not on file  . Alcohol Use: No    OB History    Grav Para Term Preterm Abortions TAB SAB Ect Mult Living                  Review of Systems  Constitutional: Negative for fever and chills.  HENT: Negative for neck pain and neck stiffness.   Respiratory: Negative for shortness of breath.   Gastrointestinal: Positive for nausea. Negative for vomiting.  Genitourinary: Negative.   Musculoskeletal: Negative for joint swelling.  Skin: Negative for rash and wound.  Neurological: Positive for numbness. Negative for dizziness, weakness and headaches.    Allergies  Tramadol  Home Medications   Current Outpatient Rx  Name  Route  Sig  Dispense  Refill  . ALBUTEROL SULFATE HFA 108 (90 BASE) MCG/ACT IN AERS   Inhalation   Inhale 2 puffs into the lungs every 6 (six) hours as needed for wheezing.   1 Inhaler   2   . ALPRAZOLAM 0.5 MG PO TABS   Oral   Take 1 tablet (0.5 mg total) by mouth 2 (two) times daily as needed for sleep  or anxiety.   20 tablet   0   . AMLEXANOX 5 % MT PSTE   Mouth/Throat   Use as directed in the mouth or throat 4 (four) times daily.   5 g   0   . DIPHENHYD-HYDROCORT-NYSTATIN MT SUSP      Swish, gargle, and spit 2 teaspoonsful every 6 hours   1 Bottle   0   . GABAPENTIN 300 MG PO CAPS   Oral   Take 1 capsule (300 mg total) by mouth daily.   90 capsule   0   . IBUPROFEN 200 MG PO TABS   Oral   Take 800 mg by mouth every 6 (six) hours as needed. For pain         . LIDOCAINE VISCOUS 2 % MT SOLN   Oral   Take 15 mLs by mouth every 3 (three) hours as needed for pain.   100 mL   0   . METFORMIN HCL 1000 MG PO TABS   Oral   Take 1 tablet (1,000 mg total) by mouth 2 (two) times daily with a meal.   60 tablet   3   . OXYCODONE-ACETAMINOPHEN 5-325 MG PO TABS      1 to 2 tabs PO q6hrs  PRN for pain  15 tablet   0   . PREDNISONE 10 MG PO TABS      20 mg daily for 5 days, then 10 mg daily for next 5 days and then stop.   15 tablet   0   . SULFAMETHOXAZOLE-TRIMETHOPRIM 800-160 MG PO TABS   Oral   Take 1 tablet by mouth 2 (two) times daily. One po bid x 3 days   6 tablet   0     BP 148/101  Pulse 117  Temp 97.7 F (36.5 C) (Oral)  Resp 26  SpO2 96%  LMP 05/22/2012  Physical Exam  Constitutional: She appears well-developed and well-nourished.  HENT:  Head: Normocephalic.  Eyes: Pupils are equal, round, and reactive to light.  Cardiovascular: Normal rate.   Pulmonary/Chest: Effort normal.  Abdominal: Soft. She exhibits no distension. There is no tenderness.  Musculoskeletal: She exhibits no edema and no tenderness.  Lymphadenopathy:    She has no cervical adenopathy.  Neurological: She is alert.  Skin: Skin is warm.    ED Course  Procedures (including critical care time)  Labs Reviewed  GLUCOSE, CAPILLARY - Abnormal; Notable for the following:    Glucose-Capillary 227 (*)     All other components within normal limits   Ct Cervical Spine Wo  Contrast  06/07/2012  *RADIOLOGY REPORT*  Clinical Data: Bilateral arm and hand pain, and numbness at the first, second and third digits bilaterally.  Intermittent neck pain.  CT CERVICAL SPINE WITHOUT CONTRAST  Technique:  Multidetector CT imaging of the cervical spine was performed. Multiplanar CT image reconstructions were also generated.  Comparison: None.  Findings: There is no evidence of fracture or subluxation. Vertebral bodies demonstrate normal height and alignment.  Mild multilevel disc space narrowing is noted along the cervical spine, with associated anterior and posterior disc osteophyte complexes. Prevertebral soft tissues are within normal limits.  There may be mild foraminal narrowing at C5-C6 on the right and C6- C7 on the left, though this would be better characterized on MRI.  The thyroid gland is unremarkable in appearance.  The visualized lung apices are clear.  No significant soft tissue abnormalities are seen.  IMPRESSION:  1.  No evidence of fracture or subluxation along the cervical spine. 2.  Mild degenerative change noted along the cervical spine.  There may be mild foraminal narrowing at C5-C6 on the right and C6-C7 on the left, though this would be better characterized on MRI.   Original Report Authenticated By: Tonia Ghent, M.D.      1. Cervical neuropathic pain       MDM  Cervical radiculopathy         Arman Filter, NP 06/07/12 0035  Arman Filter, NP 06/07/12 0041

## 2012-06-11 ENCOUNTER — Telehealth: Payer: Self-pay | Admitting: *Deleted

## 2012-06-11 NOTE — Telephone Encounter (Signed)
Pharmacy called and states Rx from 06/06/12 - states unable to get Aphthasol paste. [facility]/ [address] [phone]. Stanton Kidney Norissa Bartee RN 06/11/12 12N

## 2012-06-14 NOTE — Telephone Encounter (Signed)
I did try calling the patient on the number listed on the chart to follow up on her aphthous ulcers and see if she still needs the paste. I was unable to reach the patient and left messages both the times.   Thanks, [employer]

## 2012-06-22 ENCOUNTER — Emergency Department: Payer: Self-pay | Admitting: Emergency Medicine

## 2012-06-22 ENCOUNTER — Telehealth: Payer: Self-pay | Admitting: Licensed Clinical Social Worker

## 2012-06-22 NOTE — Telephone Encounter (Signed)
CSW placed called to pt.  CSW left message requesting return call. CSW provided contact hours and phone number. 

## 2012-06-23 NOTE — Telephone Encounter (Signed)
CSW placed called to pt.  Family member answered phone, pt unavailable.  CSW did not leave message.

## 2012-06-26 NOTE — Telephone Encounter (Signed)
CSW placed called to pt.  CSW left message requesting return call. CSW provided contact hours and phone number.  Letter mailed.  CSW will sign off. 

## 2012-07-14 ENCOUNTER — Observation Stay (HOSPITAL_COMMUNITY)
Admission: EM | Admit: 2012-07-14 | Discharge: 2012-07-16 | Disposition: A | Discharge status: Home / Self Care | Payer: Medicaid Other | Attending: Family Medicine | Admitting: Family Medicine

## 2012-07-14 ENCOUNTER — Encounter (HOSPITAL_COMMUNITY): Payer: Self-pay | Admitting: *Deleted

## 2012-07-14 DIAGNOSIS — R4182 Altered mental status, unspecified: Secondary | ICD-10-CM | POA: Insufficient documentation

## 2012-07-14 DIAGNOSIS — F17200 Nicotine dependence, unspecified, uncomplicated: Secondary | ICD-10-CM

## 2012-07-14 DIAGNOSIS — F319 Bipolar disorder, unspecified: Secondary | ICD-10-CM | POA: Insufficient documentation

## 2012-07-14 DIAGNOSIS — M79642 Pain in left hand: Secondary | ICD-10-CM

## 2012-07-14 DIAGNOSIS — Z79899 Other long term (current) drug therapy: Secondary | ICD-10-CM | POA: Insufficient documentation

## 2012-07-14 DIAGNOSIS — E669 Obesity, unspecified: Secondary | ICD-10-CM

## 2012-07-14 DIAGNOSIS — T426X1A Poisoning by other antiepileptic and sedative-hypnotic drugs, accidental (unintentional), initial encounter: Secondary | ICD-10-CM | POA: Insufficient documentation

## 2012-07-14 DIAGNOSIS — F312 Bipolar disorder, current episode manic severe with psychotic features: Secondary | ICD-10-CM | POA: Diagnosis present

## 2012-07-14 DIAGNOSIS — T424X4A Poisoning by benzodiazepines, undetermined, initial encounter: Principal | ICD-10-CM | POA: Insufficient documentation

## 2012-07-14 DIAGNOSIS — E119 Type 2 diabetes mellitus without complications: Secondary | ICD-10-CM | POA: Insufficient documentation

## 2012-07-14 DIAGNOSIS — T43502A Poisoning by unspecified antipsychotics and neuroleptics, intentional self-harm, initial encounter: Secondary | ICD-10-CM | POA: Insufficient documentation

## 2012-07-14 DIAGNOSIS — R209 Unspecified disturbances of skin sensation: Secondary | ICD-10-CM

## 2012-07-14 DIAGNOSIS — I10 Essential (primary) hypertension: Secondary | ICD-10-CM | POA: Insufficient documentation

## 2012-07-14 DIAGNOSIS — Y92009 Unspecified place in unspecified non-institutional (private) residence as the place of occurrence of the external cause: Secondary | ICD-10-CM | POA: Insufficient documentation

## 2012-07-14 DIAGNOSIS — K120 Recurrent oral aphthae: Secondary | ICD-10-CM

## 2012-07-14 DIAGNOSIS — T4272XA Poisoning by unspecified antiepileptic and sedative-hypnotic drugs, intentional self-harm, initial encounter: Secondary | ICD-10-CM | POA: Insufficient documentation

## 2012-07-14 DIAGNOSIS — T50902A Poisoning by unspecified drugs, medicaments and biological substances, intentional self-harm, initial encounter: Secondary | ICD-10-CM

## 2012-07-14 DIAGNOSIS — T426X2A Poisoning by other antiepileptic and sedative-hypnotic drugs, intentional self-harm, initial encounter: Secondary | ICD-10-CM | POA: Insufficient documentation

## 2012-07-14 DIAGNOSIS — G43909 Migraine, unspecified, not intractable, without status migrainosus: Secondary | ICD-10-CM

## 2012-07-14 HISTORY — DX: Depression, unspecified: F32.A

## 2012-07-14 HISTORY — DX: Major depressive disorder, single episode, unspecified: F32.9

## 2012-07-14 LAB — RAPID URINE DRUG SCREEN, HOSP PERFORMED
Amphetamines: NOT DETECTED
Barbiturates: NOT DETECTED
Benzodiazepines: NOT DETECTED
Cocaine: NOT DETECTED
Opiates: NOT DETECTED
Tetrahydrocannabinol: NOT DETECTED

## 2012-07-14 LAB — COMPREHENSIVE METABOLIC PANEL
AST: 17 U/L (ref 0–37)
BUN: 10 mg/dL (ref 6–23)
CO2: 25 mEq/L (ref 19–32)
Chloride: 101 mEq/L (ref 96–112)
Creatinine, Ser: 0.62 mg/dL (ref 0.50–1.10)
GFR calc Af Amer: >90 mL/min (ref >90)
GFR calc non Af Amer: >90 mL/min (ref >90)
Glucose, Bld: 233 mg/dL — ABNORMAL HIGH (ref 70–99)
Total Bilirubin: 0.2 mg/dL — ABNORMAL LOW (ref 0.3–1.2)

## 2012-07-14 LAB — CBC
HCT: 37.6 % (ref 36.0–46.0)
Hemoglobin: 12.4 g/dL (ref 12.0–15.0)
MCV: 86.2 fL (ref 78.0–100.0)
RBC: 4.36 MIL/uL (ref 3.87–5.11)
WBC: 7.1 10*3/uL (ref 4.0–10.5)

## 2012-07-14 LAB — GLUCOSE, CAPILLARY
Glucose-Capillary: 214 mg/dL — ABNORMAL HIGH (ref 70–99)
Glucose-Capillary: 134 mg/dL — ABNORMAL HIGH (ref 70–99)

## 2012-07-14 LAB — ETHANOL: Alcohol, Ethyl (B): <11 mg/dL (ref 0–11)

## 2012-07-14 LAB — PREGNANCY, URINE: Preg Test, Ur: NEGATIVE

## 2012-07-14 LAB — ACETAMINOPHEN LEVEL: Acetaminophen (Tylenol), Serum: <15 ug/mL (ref 10–30)

## 2012-07-14 LAB — SALICYLATE LEVEL: Salicylate Lvl: <2 mg/dL — ABNORMAL LOW (ref 2.8–20.0)

## 2012-07-14 MED ORDER — ACETAMINOPHEN 325 MG PO TABS
650.0000 mg | ORAL_TABLET | ORAL | Status: DC | PRN
  Administered 2012-07-15: 650 mg via ORAL
  Filled 2012-07-14: qty 2

## 2012-07-14 MED ORDER — SODIUM CHLORIDE 0.9 % IV BOLUS (SEPSIS)
1000.0000 mL | Freq: Once | INTRAVENOUS | Status: AC
  Administered 2012-07-14: 1000 mL via INTRAVENOUS

## 2012-07-14 MED ORDER — GABAPENTIN 300 MG PO CAPS
300.0000 mg | ORAL_CAPSULE | Freq: Every day | ORAL | Status: DC
  Administered 2012-07-16: 300 mg via ORAL
  Filled 2012-07-14 (×2): qty 1

## 2012-07-14 MED ORDER — LORAZEPAM 1 MG PO TABS: 1.0000 mg | ORAL_TABLET | Freq: Three times a day (TID) | ORAL | Status: DC | PRN

## 2012-07-14 MED ORDER — IBUPROFEN 600 MG PO TABS
600.0000 mg | ORAL_TABLET | Freq: Four times a day (QID) | ORAL | Status: DC | PRN
  Administered 2012-07-15 – 2012-07-16 (×3): 600 mg via ORAL
  Filled 2012-07-14 (×3): qty 1

## 2012-07-14 MED ORDER — METFORMIN HCL 500 MG PO TABS
1000.0000 mg | ORAL_TABLET | Freq: Two times a day (BID) | ORAL | Status: DC
  Administered 2012-07-15 – 2012-07-16 (×2): 1000 mg via ORAL
  Filled 2012-07-14 (×7): qty 2

## 2012-07-14 NOTE — ED Notes (Signed)
Pt is falling asleep in the bathroom, when writer asked if pt was finish using the bathroom, pt starts cursing at staff, and says, "why are we trying to rush her." Writer responds that, "we have to make sure your not falling asleep while on the toilet."  Pt is still uncooperative.

## 2012-07-14 NOTE — ED Notes (Signed)
Writer observes pt in the room taking her paper pants scrub off.

## 2012-07-14 NOTE — ED Notes (Signed)
Per [employer], pt called her daughter and told her she took 20 pills. Daughter called [employer]. Patient refuses to tell this RN what meds she took. Per [employer], she told them she took 20 25mg  phenergan, 3 xanax and unknown amount of clonidine and valium. Per [employer], no med bottles matching these meds found at scene. Pt ambulatory, speech slurred. Pt reports she took pills right before she talked to her daughter. Daughter called [employer], call came in at 1800. Pt unsure of exact time. Estimate ingestion around 1730 today.

## 2012-07-14 NOTE — ED Provider Notes (Signed)
History  This chart was scribed for Loren Racer, MD by Shari Heritage, ED Scribe. The patient was seen in room WA16/WA16. Patient's care was started at 1943.   CSN: [id number]  Arrival date & time 07/14/12  1857   First MD Initiated Contact with Patient 07/14/12 1943      Chief Complaint  Patient presents with  . Ingestion    The history is provided by the patient and the police. The history is limited by the condition of the patient.    HPI Comments - Level 5 Caveat because patient is uncooperative and full history could not be obtained: [name] [name] is a 42 y.o. female who presents to the Emergency Department complaining of ingestion onset 2-3 hours ago. [employer] states they received a call at 6:02 PM from patient's daughter. Daughter claimed that patient called her saying that she took 20 pills. [employer] states that patient told EMS she took 20 25 mg Phenergan, 3 Xanax and unknown quantities of Clonidine and Valium in a plan to commit suicide. [employer] says that when they arrived on the scene they could not find pill bottles that matched what patient reported to EMS. [employer] states that patient became uncooperative and combative en route. Patient has a medical history of bipolar affective disorder, anxiety, diabetes and asthma.   Past Medical History  Diagnosis Date  . Diabetes mellitus   . Asthma   . Anxiety disorder   . Bipolar affective disorder     Past Surgical History  Procedure Date  . Knee arthroscopy   . Tubal ligation     No family history on file.  History  Substance Use Topics  . Smoking status: Current Every Day Smoker  . Smokeless tobacco: Not on file  . Alcohol Use: Yes     Comment: occasionally    OB History    Grav Para Term Preterm Abortions TAB SAB Ect Mult Living                  Review of Systems Level 5 Caveat because patient is uncooperative and ROS could not be completed.  Allergies  Tramadol  Home Medications   Current Outpatient Rx  Name  Route   Sig  Dispense  Refill  . ALBUTEROL SULFATE HFA 108 (90 BASE) MCG/ACT IN AERS   Inhalation   Inhale 2 puffs into the lungs every 6 (six) hours as needed for wheezing.   1 Inhaler   2   . ALPRAZOLAM 0.5 MG PO TABS   Oral   Take 1 tablet (0.5 mg total) by mouth 2 (two) times daily as needed for sleep or anxiety.   20 tablet   0   . AMLEXANOX 5 % MT PSTE   Mouth/Throat   Use as directed in the mouth or throat 4 (four) times daily.   5 g   0   . GABAPENTIN 300 MG PO CAPS   Oral   Take 1 capsule (300 mg total) by mouth daily.   90 capsule   0   . IBUPROFEN 200 MG PO TABS   Oral   Take 800 mg by mouth every 6 (six) hours as needed. For pain         . LIDOCAINE VISCOUS 2 % MT SOLN   Oral   Take 15 mLs by mouth every 3 (three) hours as needed for pain.   100 mL   0   . METFORMIN HCL 1000 MG PO TABS   Oral  Take 1 tablet (1,000 mg total) by mouth 2 (two) times daily with a meal.   60 tablet   3   . PROMETHAZINE HCL 25 MG PO TABS   Oral   Take 1 tablet (25 mg total) by mouth every 6 (six) hours as needed for nausea.   30 tablet   0     Triage Vitals: BP 116/80  Pulse 105  Temp 99.3 F (37.4 C) (Oral)  Resp 18  SpO2 97%  LMP 05/11/2012  Physical Exam  Constitutional: She appears well-developed and well-nourished.       Argumentative. Actively resisting exam.  HENT:  Head: Normocephalic and atraumatic.  Eyes: Conjunctivae normal are normal.  Cardiovascular: Normal rate, regular rhythm and normal heart sounds.   No murmur heard. Pulmonary/Chest: Effort normal and breath sounds normal. No respiratory distress. She has no wheezes. She has no rales.  Abdominal: Soft. She exhibits no distension. There is no tenderness.  Musculoskeletal: Normal range of motion.       Mopving alle xtremities without difficulty.   Neurological: She is alert.       Speaking clearly, no slurring.  Skin: Skin is warm and dry.    ED Course  Procedures (including critical care  time) DIAGNOSTIC STUDIES: Oxygen Saturation is 97% on room air, adequate by my interpretation.    COORDINATION OF CARE: 7:51 PM- Patient informed of current plan for treatment and evaluation and agrees with plan at this time.   9:28 PM- Spoke with ACT team. Will come to ED to evaluate patient.     Labs Reviewed  COMPREHENSIVE METABOLIC PANEL - Abnormal; Notable for the following:    Glucose, Bld 233 (*)     Albumin 3.3 (*)     Total Bilirubin 0.2 (*)     All other components within normal limits  SALICYLATE LEVEL - Abnormal; Notable for the following:    Salicylate Lvl <2.0 (*)     All other components within normal limits  GLUCOSE, CAPILLARY - Abnormal; Notable for the following:    Glucose-Capillary 214 (*)     All other components within normal limits  CBC  ETHANOL  ACETAMINOPHEN LEVEL  URINE RAPID DRUG SCREEN (HOSP PERFORMED)  PREGNANCY, URINE   No results found.   1. Suicide attempt by drug ingestion   2. Bilateral hand pain   3. Diabetes mellitus      Date: 07/14/2012  Rate: 105  Rhythm: sinus tachycardia  QRS Axis: normal  Intervals: normal  ST/T Wave abnormalities: normal  Conduction Disutrbances:none  Narrative Interpretation:   Old EKG Reviewed: unchanged    MDM  I personally performed the services described in this documentation, which was scribed in my presence. The recorded information has been reviewed and is accurate.    Loren Racer, MD 07/14/12 2225

## 2012-07-14 NOTE — ED Notes (Signed)
Per [employer]: Pt took 20 25 mg phenergan, 3 xanax, unknown amount of valium and clonopin with intention to kill herself. Pt then called her daughter who called [employer].

## 2012-07-15 ENCOUNTER — Inpatient Hospital Stay (HOSPITAL_COMMUNITY)
Admission: EM | Admit: 2012-07-15 | Discharge: 2012-07-15 | Disposition: A | Discharge status: Home / Self Care | Payer: Medicaid Other | Source: Intra-hospital | Attending: Psychiatry | Admitting: Psychiatry

## 2012-07-15 ENCOUNTER — Encounter (HOSPITAL_COMMUNITY): Payer: Self-pay | Admitting: *Deleted

## 2012-07-15 ENCOUNTER — Observation Stay (HOSPITAL_COMMUNITY): Payer: Medicaid Other

## 2012-07-15 DIAGNOSIS — K120 Recurrent oral aphthae: Secondary | ICD-10-CM

## 2012-07-15 DIAGNOSIS — M79609 Pain in unspecified limb: Secondary | ICD-10-CM

## 2012-07-15 DIAGNOSIS — T50901A Poisoning by unspecified drugs, medicaments and biological substances, accidental (unintentional), initial encounter: Secondary | ICD-10-CM

## 2012-07-15 DIAGNOSIS — R4182 Altered mental status, unspecified: Secondary | ICD-10-CM

## 2012-07-15 DIAGNOSIS — T50992A Poisoning by other drugs, medicaments and biological substances, intentional self-harm, initial encounter: Secondary | ICD-10-CM

## 2012-07-15 LAB — GLUCOSE, CAPILLARY

## 2012-07-15 MED ORDER — SODIUM CHLORIDE 0.9 % IJ SOLN: 3.0000 mL | Freq: Two times a day (BID) | INTRAMUSCULAR | Status: DC

## 2012-07-15 MED ORDER — ENOXAPARIN SODIUM 40 MG/0.4ML ~~LOC~~ SOLN
40.0000 mg | SUBCUTANEOUS | Status: DC
  Filled 2012-07-15 (×2): qty 0.4

## 2012-07-15 MED ORDER — SODIUM CHLORIDE 0.45 % IV SOLN: INTRAVENOUS | Status: DC

## 2012-07-15 MED ORDER — SODIUM CHLORIDE 0.9 % IV SOLN: INTRAVENOUS | Status: DC

## 2012-07-15 NOTE — BH Assessment (Signed)
Assessment Note   [name] [name] is a 42 y.o. female who presents to [facility] via EMS after patient overdosed on pills.  This writer attempted to assess pt, however, pt was drowsy and sleeping during assessment.  The following information is collateral as pt is not coherent enough to answer any questions.  Pt.'s daughter contacted [employer], claiming her mother had reportedly ingested approx 20-25mg  Phenergan, 3 Xanax and an unk amount of Clonidine and Valium pills. Per [employer], pt became uncooperative and combative en route to [employer].  Upon arrival to ED, pt.'s speech was slurred and pt was using profanity with medical staff.  Pt ambulated to bathroom, however was beginning to fall asleep in the bathroom.  Pt is irritable during assessment with this Clinical research associate.  Per EMS/[employer], they were unable to match pill bottles with ingestion.  Pt does have past inpt hx with [facility](2001,2008,2011,2012) for depression and SI.     Axis I: Bipolar D/O NOS  Axis II: Deferred Axis III:  Past Medical History  Diagnosis Date  . Diabetes mellitus   . Asthma   . Anxiety disorder   . Bipolar affective disorder   . Depression    Axis IV: other psychosocial or environmental problems, problems related to social environment and problems with primary support group Axis V: 21-30 behavior considerably influenced by delusions or hallucinations OR serious impairment in judgment, communication OR inability to function in almost all areas  Past Medical History:  Past Medical History  Diagnosis Date  . Diabetes mellitus   . Asthma   . Anxiety disorder   . Bipolar affective disorder   . Depression     Past Surgical History  Procedure Date  . Knee arthroscopy   . Tubal ligation     Family History: No family history on file.  Social History:  reports that she has been smoking.  She does not have any smokeless tobacco history on file. She reports that she drinks alcohol. She reports that she does not use illicit drugs.  Additional  Social History:  Alcohol / Drug Use Pain Medications: See MAR Prescriptions: See MAR  Over the Counter: See MAR  History of alcohol / drug use?: Yes Substance #1 Name of Substance 1: Alcohol  1 - Age of First Use: Unk  1 - Amount (size/oz): Unk  1 - Frequency: Unk  1 - Duration: Unk  1 - Last Use / Amount: Unk   CIWA: CIWA-Ar BP: 113/76 mmHg Pulse Rate: 97  COWS:    Allergies:  Allergies  Allergen Reactions  . Tramadol Nausea Only    "feeling like I'm on fire"    Home Medications:  (Not in a hospital admission)  OB/GYN Status:  Patient's last menstrual period was 05/11/2012.  General Assessment Data Location of Assessment: [facility] Living Arrangements: Alone Can pt return to current living arrangement?: No Admission Status: Voluntary Is patient capable of signing voluntary admission?: No Transfer from: Acute Hospital Referral Source: MD  Education Status Is patient currently in school?: No Current Grade: None  Highest grade of school patient has completed: None  Name of school: None  Contact person: None   Risk to self Suicidal Ideation: Yes-Currently Present Suicidal Intent: Yes-Currently Present Is patient at risk for suicide?: Yes Suicidal Plan?: Yes-Currently Present Specify Current Suicidal Plan: Overdose on pills  Access to Means: Yes Specify Access to Suicidal Means: Medications  What has been your use of drugs/alcohol within the last 12 months?: Pt denies  Previous Attempts/Gestures: No (  Unk ) How many times?:  (Unk ) Other Self Harm Risks: None  Triggers for Past Attempts: Unpredictable Intentional Self Injurious Behavior: None Family Suicide History: No Recent stressful life event(s): Other (Comment) (Unk ) Persecutory voices/beliefs?: No Depression: Yes Depression Symptoms: Feeling angry/irritable;Loss of interest in usual pleasures Substance abuse history and/or treatment for substance abuse?: No Suicide prevention information given to  non-admitted patients: Not applicable  Risk to Others Homicidal Ideation: No Thoughts of Harm to Others: No Current Homicidal Intent: No Current Homicidal Plan: No Access to Homicidal Means: No Identified Victim: None  History of harm to others?: No Assessment of Violence: None Noted Violent Behavior Description: None  Does patient have access to weapons?: No Criminal Charges Pending?: No Does patient have a court date: No  Psychosis Hallucinations: None noted Delusions: None noted  Mental Status Report Appear/Hygiene: Disheveled Eye Contact: Poor Motor Activity: Unable to assess Speech: Unable to assess Level of Consciousness: Drowsy;Sleeping Mood: Other (Comment) (UTA ) Affect: Unable to Assess Anxiety Level: None Thought Processes:  (UTA) Judgement: Impaired Orientation: Unable to assess Obsessive Compulsive Thoughts/Behaviors: None (UTA )  Cognitive Functioning Concentration:  (UTA) Memory:  (UTA) IQ:  (UTA ) Insight: Poor Impulse Control: Poor Appetite: Good Weight Loss: 0  Weight Gain: 0  Sleep: No Change Total Hours of Sleep: 6  Vegetative Symptoms:  (UTA)  ADLScreening [facility] Assessment Services) Patient's cognitive ability adequate to safely complete daily activities?: Yes Patient able to express need for assistance with ADLs?: Yes Independently performs ADLs?: Yes (appropriate for developmental age)  Abuse/Neglect [facility]) Physical Abuse: Denies Verbal Abuse: Denies Sexual Abuse: Denies  Prior Inpatient Therapy Prior Inpatient Therapy: Yes Prior Therapy Dates: 2001-2012 Prior Therapy Facilty/Provider(s): [facility]  Reason for Treatment: SI/Depression   Prior Outpatient Therapy Prior Outpatient Therapy: No (Unk ) Prior Therapy Dates: Unk  Prior Therapy Facilty/Provider(s): Unk  Reason for Treatment: Unk   ADL Screening (condition at time of admission) Patient's cognitive ability adequate to safely complete daily activities?: Yes Patient able to  express need for assistance with ADLs?: Yes Independently performs ADLs?: Yes (appropriate for developmental age) Weakness of Legs: None Weakness of Arms/Hands: None  Home Assistive Devices/Equipment Home Assistive Devices/Equipment: None  Therapy Consults (therapy consults require a physician order) PT Evaluation Needed: No OT Evalulation Needed: No SLP Evaluation Needed: No Abuse/Neglect Assessment (Assessment to be complete while patient is alone) Physical Abuse: Denies Verbal Abuse: Denies Sexual Abuse: Denies Exploitation of patient/patient's resources: Denies Self-Neglect: Denies Values / Beliefs Cultural Requests During Hospitalization: None Spiritual Requests During Hospitalization: None Consults Spiritual Care Consult Needed: No Social Work Consult Needed: No Merchant navy officer (For Healthcare) Advance Directive: Patient does not have advance directive;Patient would not like information Pre-existing out of facility DNR order (yellow form or pink MOST form): No Nutrition Screen- [facility] Patient's home diet: Regular Have you recently lost weight without trying?: No Have you been eating poorly because of a decreased appetite?: No Malnutrition Screening Tool Score: 0   Additional Information 1:1 In Past 12 Months?: No CIRT Risk: No Elopement Risk: No Does patient have medical clearance?: Yes     Disposition:  Disposition Disposition of Patient: Inpatient treatment program;Referred to [facility] ) Type of inpatient treatment program: Adult Patient referred to: Other (Comment) [facility] )  On Site Evaluation by:   Reviewed with Physician:     Murrell Redden 07/15/2012 2:24 AM

## 2012-07-15 NOTE — ED Notes (Signed)
Transferred to 1507 w/2 bags personal belongings, has eaten a few bites of lunch, still very sleepy

## 2012-07-15 NOTE — Progress Notes (Signed)
Pt refusing IV start and IV fluids.  States she "does not need it and does not want to be peeing, she is drinking just fine".  Pt also refused Lovenox injection.

## 2012-07-15 NOTE — Progress Notes (Signed)
Pt told RN she is in pain and has a new diagnoses of "something with 'osteo-' in her spine".  When asked what she takes for pain at home she reports that she takes her boyfriends percocet and morphine. Pt resting calmly in room with sitter at bedside. Will monitor.

## 2012-07-15 NOTE — H&P (Signed)
[facility] History and Physical  JULIEANNE HADSALL ZOX:[id number] DOB: 10/16/1970 DOA: 07/14/2012  Referring physician: ED physician PCP: Clementeen Graham, MD   Chief Complaint: Altered mental status   HPI:  Pt is 42 yo female who was brought to [facility] ED after attempted overdose at home on Xanax and Phenergan. Pt is unable to provide history at this time due to somnolence and most of the history was obtained from ED physician. Per doctor in ED, pt took unknown amount of xanax and phenergan and has been rather difficult to arouse in ED. Pt apparently woke up to eat for several minutes and rather quickly dosed off again. Blood work in ED is unremarkable for acute events and no acute findings on 12 lead EKG noted. [facility] asked to admit to telemetry unit for observation and clearance to [facility] in AM.  Assessment and Plan:  Principal Problem:  *Altered mental status - secondary to overdose on xanax and phenergan - will admit the pt to telemetry floor for observation - suicide precaution and sitter at bedside - psych consult obtained - no electrolyte abnormalities - follow up on repeat 12 lead EKG, supportive care  Active Problems:  DM - continue Metformin as per home medication regimen  BIPOLAR AFFECTIVE DISORDER - psych consultation obtained   ESSENTIAL HYPERTENSION - reasonable control - no antihypertensive medications noted on admission list - will monitor per floor protocol but currently no indication for initiating antihypertensive regimen   Code Status: Full Family Communication: No family at bedside  Disposition Plan: Admit to telemetry bed   Review of Systems:  Unable to obtain due to altered mental status    Past Medical History  Diagnosis Date  . Diabetes mellitus   . Asthma   . Anxiety disorder   . Bipolar affective disorder   . Depression     Past Surgical History  Procedure Date  . Knee arthroscopy   . Tubal ligation     Social History:  reports that she has been  smoking.  She does not have any smokeless tobacco history on file. She reports that she drinks alcohol. She reports that she does not use illicit drugs.  Allergies  Allergen Reactions  . Tramadol Nausea Only    "feeling like I'm on fire"    No family history of cancers or heat disease   Prior to Admission medications   Medication Sig Start Date End Date Taking? Authorizing Provider  albuterol (PROVENTIL HFA;VENTOLIN HFA) 108 (90 BASE) MCG/ACT inhaler Inhale 2 puffs into the lungs every 6 (six) hours as needed for wheezing. 05/21/12  Yes Sunday Spillers, MD  ALPRAZolam Prudy Feeler) 0.5 MG tablet Take 1 tablet (0.5 mg total) by mouth 2 (two) times daily as needed for sleep or anxiety. 06/01/12  Yes Elyse Jarvis, MD  amlexanox (APHTHASOL) 5 % paste Use as directed in the mouth or throat 4 (four) times daily. 06/06/12  Yes Elyse Jarvis, MD  gabapentin (NEURONTIN) 300 MG capsule Take 1 capsule (300 mg total) by mouth daily. 06/01/12  Yes Elyse Jarvis, MD  ibuprofen (ADVIL,MOTRIN) 200 MG tablet Take 800 mg by mouth every 6 (six) hours as needed. For pain   Yes Historical Provider, MD  lidocaine (XYLOCAINE) 2 % solution Take 15 mLs by mouth every 3 (three) hours as needed for pain. 05/23/12  Yes Nicole Pisciotta, PA-C  metFORMIN (GLUCOPHAGE) 1000 MG tablet Take 1 tablet (1,000 mg total) by mouth 2 (two) times daily with a meal. 06/01/12  Yes Elyse Jarvis, MD  promethazine (PHENERGAN) 25 MG tablet Take 1 tablet (25 mg total) by mouth every 6 (six) hours as needed for nausea. 06/07/12  Yes Arman Filter, NP    Physical Exam: Filed Vitals:   07/14/12 2250 07/15/12 0133 07/15/12 0447 07/15/12 1153  BP: 113/76 113/76 118/75 129/79  Pulse: 97 97 95 99  Temp: 98.3 F (36.8 C)  98.1 F (36.7 C) 98.6 F (37 C)  TempSrc: Oral  Oral Oral  Resp:   18 17  SpO2: 97%  96% 100%    Physical Exam  Constitutional: Appears stable but somnolent, difficult to arise, not in acute distress HENT: Normocephalic.  External right and left ear normal. Oropharynx is clear and moist.  Eyes: Conjunctivae normal. PERRLA, no scleral icterus.  Neck: Normal ROM. Neck supple. No JVD. No tracheal deviation. No thyromegaly.  CVS: RRR, S1/S2 +, no murmurs, no gallops, no carotid bruit.  Pulmonary: Effort and breath sounds normal, no stridor, decreased breath sounds at bases  Abdominal: Soft. BS +,  no distension, tenderness, rebound or guarding.  Musculoskeletal: Normal range of motion. No evident edema and no tenderness.  Lymphadenopathy: No lymphadenopathy noted, cervical, inguinal. Neuro: Somnolent and difficult to arouse, responds to sternal rub, moving all 4 extremities spontaneously Skin: Skin is warm and dry. No rash noted. Not diaphoretic. No erythema. No pallor.  Psychiatric: Unable to assess due to somnolent mental status    Labs on Admission:  Basic Metabolic Panel:  Lab 07/14/12 1610  NA 136  K 3.9  CL 101  CO2 25  GLUCOSE 233*  BUN 10  CREATININE 0.62  CALCIUM 9.0  MG --  PHOS --   Liver Function Tests:  Lab 07/14/12 1950  AST 17  ALT 17  ALKPHOS 76  BILITOT 0.2*  PROT 6.0  ALBUMIN 3.3*   CBC:  Lab 07/14/12 1950  WBC 7.1  NEUTROABS --  HGB 12.4  HCT 37.6  MCV 86.2  PLT 225   CBG:  Lab 07/14/12 2305 07/14/12 1945  GLUCAP 134* 214*    Radiological Exams on Admission: No results found.  EKG: Normal sinus rhythm, no ST/T wave changes  Debbora Presto, MD  [facility] Pager [phone]  If 7PM-7AM, please contact night-coverage [email or web address] Password [email or web address] 07/15/2012, 1:47 PM

## 2012-07-15 NOTE — ED Provider Notes (Addendum)
Patient sleeping but answers question with verbal stimuli.  Patient up to bathroom.  Patient has been accepted to behavioral health and waiting for bed assignment.    Hilario Quarry, MD 07/15/12 [id number]  Patient continues somnolent and unable to converse.  Her vital signs are stable.  Discussed with Dr. Izola Price and will admit to tele bed.   Hilario Quarry, MD 07/15/12 1347  Hilario Quarry, MD 07/15/12 1400

## 2012-07-16 DIAGNOSIS — R4182 Altered mental status, unspecified: Secondary | ICD-10-CM

## 2012-07-16 LAB — CBC
MCH: 28.5 pg (ref 26.0–34.0)
MCV: 86.7 fL (ref 78.0–100.0)
Platelets: 228 10*3/uL (ref 150–400)
RBC: 4.6 MIL/uL (ref 3.87–5.11)
RDW: 13.6 % (ref 11.5–15.5)

## 2012-07-16 LAB — BASIC METABOLIC PANEL
CO2: 25 mEq/L (ref 19–32)
Calcium: 9.1 mg/dL (ref 8.4–10.5)
Creatinine, Ser: 0.62 mg/dL (ref 0.50–1.10)
Glucose, Bld: 228 mg/dL — ABNORMAL HIGH (ref 70–99)

## 2012-07-16 MED ORDER — KETOROLAC TROMETHAMINE 30 MG/ML IJ SOLN
30.0000 mg | Freq: Once | INTRAMUSCULAR | Status: AC
  Administered 2012-07-16: 30 mg via INTRAMUSCULAR
  Filled 2012-07-16: qty 1

## 2012-07-16 MED ORDER — KETOROLAC TROMETHAMINE 30 MG/ML IJ SOLN: 30.0000 mg | Freq: Once | INTRAMUSCULAR | Status: DC

## 2012-07-16 MED ORDER — ALBUTEROL SULFATE (5 MG/ML) 0.5% IN NEBU
2.5000 mg | INHALATION_SOLUTION | RESPIRATORY_TRACT | Status: DC | PRN
Start: 2012-07-16 — End: 2012-07-16

## 2012-07-16 MED ORDER — ACETAMINOPHEN 325 MG PO TABS
650.0000 mg | ORAL_TABLET | Freq: Four times a day (QID) | ORAL | Status: DC | PRN
  Filled 2012-07-16: qty 2

## 2012-07-16 MED ORDER — MUSCLE RUB 10-15 % EX CREA
TOPICAL_CREAM | CUTANEOUS | Status: DC | PRN
  Administered 2012-07-16: 1 via TOPICAL
  Filled 2012-07-16: qty 85

## 2012-07-16 NOTE — Progress Notes (Signed)
Per MD, Pt ready for d/c.  Notified Tasha at [facility].  Rodney Booze to notify Tori and contact CSW with a disposition.  Providence Crosby, LCSWA Clinical Social Work [phone]

## 2012-07-16 NOTE — Progress Notes (Signed)
Clinical Social Work Department CLINICAL SOCIAL WORK PSYCHIATRY SERVICE LINE ASSESSMENT 07/16/2012  Patient:  [name] [name]  Account:  [id number]  Admit Date:  07/14/2012  Clinical Social Worker:  Doroteo Glassman  Date/Time:  07/16/2012 10:23 AM Referred by:  Physician  Date referred:  07/16/2012 Reason for Referral  Behavioral Health Issues   Presenting Symptoms/Problems (In the person's/family's own words):   Pt OD'd on Xanax and Phernergan    Abuse/Neglect/Trauma Comments:   Psychiatric History (check all that apply)  Inpatient/hospitilization  Outpatient treatment   Psychiatric medications:  none   Current Mental Health Hospitalizations/Previous Mental Health History:   Current provider:   none   Place and Date:   Current Medications:   see H&P   Previous Impatient Admission/Date/Reason:   Pt received inpt services at [facility] 2x due to anxiety and depression.   Emotional Health / Current Symptoms    Suicide/Self Harm  None reported   Suicide attempt in the past:   Pt denies hx of suicide attempts   Other harmful behavior:   Psychotic/Dissociative Symptoms  None reported   Other Psychotic/Dissociative Symptoms:    Attention/Behavioral Symptoms  Within Normal Limits   Other Attention / Behavioral Symptoms:    Cognitive Impairment  Within Normal Limits   Other Cognitive Impairment:    Mood and Adjustment  Flat    Stress, Anxiety, Trauma, Any Recent Loss/Stressor  Relationship   Anxiety (frequency):   Phobia (specify):   Compulsive behavior (specify):   Obsessive behavior (specify):   Other:   Pt reported relational pxs with her boyfriend   Substance Abuse/Use  None   SBIRT completed (please refer for detailed history):    Self-reported substance use:   Urinary Drug Screen Completed:   Alcohol level:    Environmental/Housing/Living Arrangement  Stable housing   Who is in the home:   43 year old and 53 year old children.    Emergency contact:  [name] [name]   Financial  Medicaid   Patient's Strengths and Goals (patient's own words):   Clinical Social Worker's Interpretive Summary:   Met with Pt to discuss current admission.    Pt reported that she was never wanting to kill herself; she was trying to get attention from her boyfriend.  Pt stated that she knew that the amount of Phenergan she took wasn't lethal, as she has witnessed her mom take significantly more in attempts to kill self and that there were no serious consequences.    Pt stated that she had asked her boyfriend to give her more attention to no avail.  Pt stated that she felt her only recourse, at that point, was to OD in order to get his attention.    Pt denied current SI, HI, AVH, paranoia, delusions.  She stated that she has been to [facility] for anxiety and depression and that she doesn't feel that inpt is warranted, at thist time.    Pt reported that she has seen a psychiatrist on an outpt basis, by hx, and that she has been on numerous psych meds, including Depakote, Seroquel, Lamictal, Lithium.   She stated that she doesn't feel that she needs to be on meds but would be willing to explore outpt tx.    Pt asked to be seen by psych MD so that psych MD can lift the IVC and send her home.  Pt stated that she has bills that she needs to pay and that she needs to return home to be with her children, who are currently  staying with their grandparents.    CSW thanked Pt for her time.    CSW notified psych MD.   Disposition:  Recommend Psych CSW continuing to support while in hospital  [name] [name], [employer] Clinical Social Work [phone]

## 2012-07-16 NOTE — Progress Notes (Addendum)
Pt seen by psych MD.  [name] [name] with psych MD.  Psych MD stated that inpt tx is warranted.  Notified Fannie Knee at [facility].  Per Fannie Knee, Pt is appropriate for the 400-hall, however there are no 400-hall beds available currently.  Fannie Knee to contact CSW when a bed becomes available.  CSW to continue to follow.  Providence Crosby, LCSWA Clinical Social Work [phone]

## 2012-07-16 NOTE — Progress Notes (Signed)
Patient discharged home, alert and oriented, discharge instructions given, patient verbalize understanding of discharge instructions given, patient in stable condition at this time

## 2012-07-16 NOTE — Progress Notes (Signed)
Updated Pt on [facility] status.  Answered Pt's IVC questions.  Providence Crosby, LCSWA Clinical Social Work [phone]

## 2012-07-16 NOTE — Progress Notes (Signed)
MD has been notified that telemetry has been removed due to pt continuously disconnecting it & pt threatening "I'm about to throw it against the wall, just take it off",  "My heart is fine I've had EKG's before".  She states that the telemetry gets in her way while she is trying to sleep.  Strip printed from minutes previous to removal and placed in chart. Patient is somewhat restless but more calm after removal of telemetry. Will continue to monitor.

## 2012-07-16 NOTE — Progress Notes (Signed)
Pt continues to call RN into room to ask for pain medication for her back despite ibuprofen and IM toradol. When RN asks her questions she often does not respond and it appears that she is asleep. Sitter at bedside. Will continue to monitor.

## 2012-07-16 NOTE — Progress Notes (Signed)
Spoke with Verne Spurr, PA at [facility].  Lloyd Huger stating that, per CSW's note, Pt not meeting inpt tx criteria.  Lloyd Huger asked CSW to contact psych MD and inquire about d/c home with ACT team following.  Spoke with psych MD.  Psych MD in agreement with Pt d/c'ing home with ACT team following.  Spoke with Tanna Savoy at [facility].  Notified Eric that psych MD ok with Pt d/c'ing home with ACT team following.  Spoke with MD.  Discussed with MD [facility] and psych MD's current recommendations.  MD ok with recommendation.  MD to contact psych MD for further information.  MD signed Examination and Recommendation document to rescind IVC papers.  Faxed rescinded Examination and Recommendation document to [facility].  Received fax confirmation.  Notified Pt of current d/c plan.  Pt happy with plan and agreed to allow CSW to make a referral to the ACT team.  Contacted the ACT team to request referral form.  Deanna Artis to fax referral form to CSW at [phone].  Faxed referral form to ACT team.  Received fax confirmation.  No further psych CSW needs.    Pt to be d/c'd.  Providence Crosby, LCSWA Clinical Social Work [phone]

## 2012-07-16 NOTE — Discharge Summary (Addendum)
Physician Discharge Summary  [name] [name] WUX:[id number] DOB: 1971/01/20 DOA: 07/14/2012  PCP: Clementeen Graham, MD  Admit date: 07/14/2012 Discharge date: 07/16/2012  Time spent: > 35 minutes  Recommendations for Outpatient Follow-up:  1. Please be sure to follow up with your primary care physician and psychiatrist given your recent SI.  Discharge Diagnoses:  Principal Problem:  *Altered mental status Active Problems:  DM  BIPOLAR AFFECTIVE DISORDER  ESSENTIAL HYPERTENSION   Discharge Condition: Stable  Diet recommendation: Regular  Filed Weights   07/15/12 1813  Weight: 89.2 kg (196 lb 10.4 oz)    History of present illness:  From original HPI: Pt is 42 yo female who was brought to [facility] ED after attempted overdose at home on Xanax and Phenergan. Pt is unable to provide history at this time due to somnolence and most of the history was obtained from ED physician. Per doctor in ED, pt took unknown amount of xanax and phenergan and has been rather difficult to arouse in ED. Pt apparently woke up to eat for several minutes and rather quickly dosed off again. Blood work in ED is unremarkable for acute events and no acute findings on 12 lead EKG noted. TRH asked to admit to telemetry unit for observation and clearance to [facility] in AM.  Hospital Course:  Altered mental status  - Patient reports that she took many phenergan and zoloft.  Report that she wanted to die and wanted attention.  Currently denies any suicidal ideation. - Pt observed on telemetry and once mentation improved she removed her telemetry monitoring and refused to wear it.  No red flags reported. - suicide precaution and sitter at bedside  - psych consult obtained  - no electrolyte abnormalities  - follow up on repeat 12 lead EKG, supportive care   Active Problems:  DM  - continue Metformin as per home medication regimen   BIPOLAR AFFECTIVE DISORDER  - psych consultation obtained and pending. Further recommendations once  patient transitions to Behavioral health hospital.  ESSENTIAL HYPERTENSION  - reasonably controled - no antihypertensive medications noted on admission list    Procedures:  none  Consultations:  Psychiatry  Discharge Exam: Filed Vitals:   07/15/12 1635 07/15/12 1813 07/15/12 2142 07/16/12 0522  BP: 135/84  141/92 138/86  Pulse: 95  95 83  Temp: 98.3 F (36.8 C)  98.3 F (36.8 C) 98.2 F (36.8 C)  TempSrc: Oral  Oral Oral  Resp: 18  18 18   Height:  5\' 5"  (1.651 m)    Weight:  89.2 kg (196 lb 10.4 oz)    SpO2: 93%  99% 99%    General: Pt in NAD, Alert and awake Cardiovascular: RRR, No MRG Respiratory: CTA BL, no wheezes, no increased work of breathing Neurological: Patient moves all extremities equally, no facial asymmetry, answers questions appropriately  Discharge Instructions  Discharge Orders    Future Orders Please Complete By Expires   Diet - low sodium heart healthy      Increase activity slowly      Discharge instructions      Comments:   Please follow up with Psychiatrist for further recommendations   Call MD for:  persistant nausea and vomiting      Call MD for:  severe uncontrolled pain      Call MD for:  difficulty breathing, headache or visual disturbances          Medication List     As of 07/16/2012  9:58 AM    STOP  taking these medications         ALPRAZolam 0.5 MG tablet   Commonly known as: XANAX      Diphenhyd-Hydrocort-Nystatin Susp      lidocaine 2 % solution   Commonly known as: XYLOCAINE      TAKE these medications         albuterol 108 (90 BASE) MCG/ACT inhaler   Commonly known as: PROVENTIL HFA;VENTOLIN HFA   Inhale 2 puffs into the lungs every 6 (six) hours as needed for wheezing.      amlexanox 5 % paste   Commonly known as: APHTHASOL   Use as directed in the mouth or throat 4 (four) times daily.      gabapentin 300 MG capsule   Commonly known as: NEURONTIN   Take 1 capsule (300 mg total) by mouth daily.       ibuprofen 200 MG tablet   Commonly known as: ADVIL,MOTRIN   Take 800 mg by mouth every 6 (six) hours as needed. For pain      metFORMIN 1000 MG tablet   Commonly known as: GLUCOPHAGE   Take 1 tablet (1,000 mg total) by mouth 2 (two) times daily with a meal.      promethazine 25 MG tablet   Commonly known as: PHENERGAN   Take 1 tablet (25 mg total) by mouth every 6 (six) hours as needed for nausea.          The results of significant diagnostics from this hospitalization (including imaging, microbiology, ancillary and laboratory) are listed below for reference.    Significant Diagnostic Studies: X-ray Chest Pa And Lateral   07/15/2012  *RADIOLOGY REPORT*  Clinical Data: Overdose.  Asthma.  Smoker.  Diabetic.  CHEST - 2 VIEW  Comparison: 10/20/2009.  Findings: Minimal peribronchial thickening unchanged and may represent reactive changes/mild chronic bronchitis.  No infiltrate, congestive heart failure or pneumothorax.  Heart size within normal limits.  Mild thoracic kyphosis and degenerative changes.  IMPRESSION: Minimal peribronchial thickening.   Original Report Authenticated By: Lacy Duverney, M.D.     Microbiology: No results found for this or any previous visit (from the past 240 hour(s)).   Labs: Basic Metabolic Panel:  Lab 07/16/12 0981 07/14/12 1950  NA 138 136  K 4.3 3.9  CL 105 101  CO2 25 25  GLUCOSE 228* 233*  BUN 10 10  CREATININE 0.62 0.62  CALCIUM 9.1 9.0  MG -- --  PHOS -- --   Liver Function Tests:  Lab 07/14/12 1950  AST 17  ALT 17  ALKPHOS 76  BILITOT 0.2*  PROT 6.0  ALBUMIN 3.3*   No results found for this basename: LIPASE:5,AMYLASE:5 in the last 168 hours No results found for this basename: AMMONIA:5 in the last 168 hours CBC:  Lab 07/16/12 0515 07/14/12 1950  WBC 8.2 7.1  NEUTROABS -- --  HGB 13.1 12.4  HCT 39.9 37.6  MCV 86.7 86.2  PLT 228 225   Cardiac Enzymes: No results found for this basename:  CKTOTAL:5,CKMB:5,CKMBINDEX:5,TROPONINI:5 in the last 168 hours BNP: BNP (last 3 results) No results found for this basename: PROBNP:3 in the last 8760 hours CBG:  Lab 07/15/12 2211 07/15/12 1742 07/14/12 2305 07/14/12 1945  GLUCAP 201* 220* 134* 214*       Signed:  Penny Pia  [facility] 07/16/2012, 9:58 AM    Addendum: - Patient was evaluated by psychiatry and at this point after discussion with patient and psychiatry patient currently cleared to be discharged home  with close follow up from ACT team.  Social worker aware.  I have further evaluated patient and she currently denies any suicidal ideation, plans, and denies wanting to hurt herself.   The plan will be for discharge home with close psych follow up as per my discussion with Psychiatry and social worker.

## 2012-07-16 NOTE — BH Assessment (Signed)
[facility] Assessment Progress Note      Spoke with Providence Crosby SW on medical unit where client currently resides. She is ready for transfer here per Dr. Ventura Bruns assessment though no note in system yet. Originally gave [facility] bed number on 500 hall but later realized mistake that she has been accepted to the service of Dr. Jannifer Franklin and the 400 hall. Notified Marchelle Folks of my mistake and that as of now there is no available bed on the 400 hall for a female, and there is not an expected discharge today for a female bed.

## 2012-07-16 NOTE — Progress Notes (Signed)
Counselor asked patient if pt wanted support in the form of presence or talking.  Pt declined support, stating, "I don't need to talk right now".  Counselor offered future support to pt if pt needs.  Whitney P Akers Counseling Intern [employer]

## 2012-08-27 ENCOUNTER — Other Ambulatory Visit: Payer: Self-pay | Admitting: Internal Medicine

## 2012-08-27 NOTE — Telephone Encounter (Signed)
  When she was seen by me, it was suppose to be just one time ED visit.  I am not sure about that. She was just seen in the clinic x1 and now she has [facility] listed as her PCP. I guess she chose different PCP now.   I hit approve first but then called pharmacy to cancel that.  Thanks, [employer]

## 2012-08-27 NOTE — Telephone Encounter (Signed)
Is this one of our patients now?  Please advise before refill.   Seen in clinic on 06/01/12 with f/u

## 2012-08-28 ENCOUNTER — Telehealth (HOSPITAL_COMMUNITY): Payer: Self-pay | Admitting: *Deleted

## 2012-08-28 ENCOUNTER — Encounter (HOSPITAL_COMMUNITY): Payer: Self-pay | Admitting: Emergency Medicine

## 2012-08-28 ENCOUNTER — Emergency Department (INDEPENDENT_AMBULATORY_CARE_PROVIDER_SITE_OTHER)
Admission: EM | Admit: 2012-08-28 | Discharge: 2012-08-28 | Disposition: A | Discharge status: Home / Self Care | Payer: Medicaid Other | Source: Home / Self Care | Attending: Emergency Medicine | Admitting: Emergency Medicine

## 2012-08-28 DIAGNOSIS — M542 Cervicalgia: Secondary | ICD-10-CM

## 2012-08-28 DIAGNOSIS — R209 Unspecified disturbances of skin sensation: Secondary | ICD-10-CM

## 2012-08-28 DIAGNOSIS — G8929 Other chronic pain: Secondary | ICD-10-CM

## 2012-08-28 DIAGNOSIS — R202 Paresthesia of skin: Secondary | ICD-10-CM

## 2012-08-28 HISTORY — DX: Migraine, unspecified, not intractable, without status migrainosus: G43.909

## 2012-08-28 MED ORDER — FREESTYLE SYSTEM KIT: 1.0000 | PACK | Status: DC | PRN

## 2012-08-28 MED ORDER — ALBUTEROL SULFATE HFA 108 (90 BASE) MCG/ACT IN AERS: 1.0000 | INHALATION_SPRAY | Freq: Four times a day (QID) | RESPIRATORY_TRACT | Status: DC | PRN

## 2012-08-28 MED ORDER — GLUCOSE BLOOD VI STRP: ORAL_STRIP | Status: DC

## 2012-08-28 MED ORDER — DICLOFENAC SODIUM 1 % TD GEL: 2.0000 g | Freq: Two times a day (BID) | TRANSDERMAL | Status: AC
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
## 2012-08-28 NOTE — ED Notes (Signed)
Pt C/o back pain starting in neck and radiating down. Was seen in [facility] ED last month and given Percocet. Started in November. Has numbness and swelling in fingers. Uses heating pads with mild relief.

## 2012-08-28 NOTE — ED Notes (Signed)
[name] [name] from [facility] and asked how often pt. is supposed to check his glucose for the test strips. Dr. Ladon Applebaum said 2x/day. Pharmacist notified. [name] 08/28/2012

## 2012-08-31 LAB — GLUCOSE, CAPILLARY: Glucose-Capillary: 273 mg/dL — ABNORMAL HIGH (ref 70–99)

## 2012-12-17 ENCOUNTER — Other Ambulatory Visit: Payer: Self-pay

## 2013-04-15 ENCOUNTER — Other Ambulatory Visit: Payer: Self-pay

## 2013-09-16 ENCOUNTER — Other Ambulatory Visit: Payer: Self-pay

## 2014-04-28 ENCOUNTER — Emergency Department (HOSPITAL_COMMUNITY)
Admission: EM | Admit: 2014-04-28 | Discharge: 2014-04-28 | Discharge status: Against Medical Advice | Payer: Medicaid Other | Attending: Emergency Medicine | Admitting: Emergency Medicine

## 2014-04-28 DIAGNOSIS — Z720 Tobacco use: Secondary | ICD-10-CM | POA: Insufficient documentation

## 2014-04-28 DIAGNOSIS — J45909 Unspecified asthma, uncomplicated: Secondary | ICD-10-CM | POA: Insufficient documentation

## 2014-04-28 DIAGNOSIS — F309 Manic episode, unspecified: Secondary | ICD-10-CM | POA: Insufficient documentation

## 2014-04-28 DIAGNOSIS — E1165 Type 2 diabetes mellitus with hyperglycemia: Secondary | ICD-10-CM | POA: Insufficient documentation

## 2014-04-28 NOTE — ED Notes (Signed)
Unable to find patient. Everyone is on lookout for patient. AD aware. [employer] aware. Security aware.
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
## 2014-04-28 NOTE — ED Notes (Signed)
Bed: [facility] Expected date:  Expected time:  Means of arrival:  Comments: EMS--Bipolar/HTN

## 2014-04-28 NOTE — ED Notes (Addendum)
Patient visualized going to the bathroom. Seen walk past-went after patient with another RN. All bathrooms checked. All rooms checked. Security notified. EMS bay checked. All parking lots and surrounding outside areas checked. No evidence of patient anywhere. All staff members on alert.

## 2014-04-29 ENCOUNTER — Encounter (HOSPITAL_COMMUNITY): Payer: Self-pay | Admitting: Emergency Medicine

## 2014-04-29 ENCOUNTER — Emergency Department (HOSPITAL_COMMUNITY)
Admission: EM | Admit: 2014-04-29 | Discharge: 2014-04-30 | Disposition: A | Discharge status: Other Acute Inpatient Hospital | Payer: Medicaid Other | Attending: Emergency Medicine | Admitting: Emergency Medicine

## 2014-04-29 DIAGNOSIS — F3110 Bipolar disorder, current episode manic without psychotic features, unspecified: Secondary | ICD-10-CM

## 2014-04-29 DIAGNOSIS — F22 Delusional disorders: Secondary | ICD-10-CM | POA: Diagnosis present

## 2014-04-29 DIAGNOSIS — F419 Anxiety disorder, unspecified: Secondary | ICD-10-CM | POA: Insufficient documentation

## 2014-04-29 DIAGNOSIS — J45909 Unspecified asthma, uncomplicated: Secondary | ICD-10-CM | POA: Insufficient documentation

## 2014-04-29 DIAGNOSIS — Z720 Tobacco use: Secondary | ICD-10-CM | POA: Diagnosis not present

## 2014-04-29 DIAGNOSIS — Z79899 Other long term (current) drug therapy: Secondary | ICD-10-CM | POA: Insufficient documentation

## 2014-04-29 DIAGNOSIS — G43909 Migraine, unspecified, not intractable, without status migrainosus: Secondary | ICD-10-CM | POA: Insufficient documentation

## 2014-04-29 DIAGNOSIS — F312 Bipolar disorder, current episode manic severe with psychotic features: Secondary | ICD-10-CM | POA: Diagnosis present

## 2014-04-29 DIAGNOSIS — E119 Type 2 diabetes mellitus without complications: Secondary | ICD-10-CM | POA: Insufficient documentation

## 2014-04-29 LAB — PREGNANCY, URINE: Preg Test, Ur: NEGATIVE

## 2014-04-29 LAB — COMPREHENSIVE METABOLIC PANEL
ALK PHOS: 128 U/L — AB (ref 39–117)
ALT: 14 U/L (ref 0–35)
ANION GAP: 22 — AB (ref 5–15)
AST: 13 U/L (ref 0–37)
Albumin: 4.1 g/dL (ref 3.5–5.2)
BILIRUBIN TOTAL: 0.3 mg/dL (ref 0.3–1.2)
BUN: 13 mg/dL (ref 6–23)
CO2: 16 meq/L — AB (ref 19–32)
Calcium: 9.7 mg/dL (ref 8.4–10.5)
Chloride: 95 mEq/L — ABNORMAL LOW (ref 96–112)
Creatinine, Ser: 0.7 mg/dL (ref 0.50–1.10)
GLUCOSE: 448 mg/dL — AB (ref 70–99)
POTASSIUM: 3.8 meq/L (ref 3.7–5.3)
Sodium: 133 mEq/L — ABNORMAL LOW (ref 137–147)
TOTAL PROTEIN: 7.2 g/dL (ref 6.0–8.3)

## 2014-04-29 LAB — URINALYSIS, ROUTINE W REFLEX MICROSCOPIC
BILIRUBIN URINE: NEGATIVE
Glucose, UA: >1000 mg/dL — AB
HGB URINE DIPSTICK: NEGATIVE
Ketones, ur: 15 mg/dL — AB
Leukocytes, UA: NEGATIVE
NITRITE: NEGATIVE
PH: 5 (ref 5.0–8.0)
Protein, ur: NEGATIVE mg/dL
SPECIFIC GRAVITY, URINE: 1.034 — AB (ref 1.005–1.030)
Urobilinogen, UA: 0.2 mg/dL (ref 0.0–1.0)

## 2014-04-29 LAB — BASIC METABOLIC PANEL
ANION GAP: 14 (ref 5–15)
BUN: 10 mg/dL (ref 6–23)
CHLORIDE: 105 meq/L (ref 96–112)
CO2: 21 mEq/L (ref 19–32)
CREATININE: 0.65 mg/dL (ref 0.50–1.10)
Calcium: 8.5 mg/dL (ref 8.4–10.5)
Glucose, Bld: 300 mg/dL — ABNORMAL HIGH (ref 70–99)
Potassium: 4 mEq/L (ref 3.7–5.3)
Sodium: 140 mEq/L (ref 137–147)

## 2014-04-29 LAB — CBC WITH DIFFERENTIAL/PLATELET
Basophils Absolute: 0.1 10*3/uL (ref 0.0–0.1)
Basophils Relative: 0 % (ref 0–1)
EOS ABS: 0.1 10*3/uL (ref 0.0–0.7)
Eosinophils Relative: 1 % (ref 0–5)
HCT: 44.7 % (ref 36.0–46.0)
HEMOGLOBIN: 15.3 g/dL — AB (ref 12.0–15.0)
LYMPHS ABS: 3.5 10*3/uL (ref 0.7–4.0)
LYMPHS PCT: 25 % (ref 12–46)
MCH: 29.4 pg (ref 26.0–34.0)
MCHC: 34.2 g/dL (ref 30.0–36.0)
MCV: 86 fL (ref 78.0–100.0)
MONOS PCT: 7 % (ref 3–12)
Monocytes Absolute: 1 10*3/uL (ref 0.1–1.0)
NEUTROS PCT: 67 % (ref 43–77)
Neutro Abs: 9.4 10*3/uL — ABNORMAL HIGH (ref 1.7–7.7)
Platelets: 231 10*3/uL (ref 150–400)
RBC: 5.2 MIL/uL — AB (ref 3.87–5.11)
RDW: 13.5 % (ref 11.5–15.5)
WBC: 13.9 10*3/uL — AB (ref 4.0–10.5)

## 2014-04-29 LAB — HEMOGLOBIN A1C
Hgb A1c MFr Bld: 11.9 % — ABNORMAL HIGH (ref <5.7)
Mean Plasma Glucose: 295 mg/dL — ABNORMAL HIGH (ref <117)

## 2014-04-29 LAB — RAPID URINE DRUG SCREEN, HOSP PERFORMED
Amphetamines: NOT DETECTED
BARBITURATES: NOT DETECTED
Benzodiazepines: NOT DETECTED
Cocaine: NOT DETECTED
Opiates: NOT DETECTED
Tetrahydrocannabinol: NOT DETECTED

## 2014-04-29 LAB — CBG MONITORING, ED
Glucose-Capillary: 359 mg/dL — ABNORMAL HIGH (ref 70–99)
Glucose-Capillary: 270 mg/dL — ABNORMAL HIGH (ref 70–99)
Glucose-Capillary: 317 mg/dL — ABNORMAL HIGH (ref 70–99)
Glucose-Capillary: 230 mg/dL — ABNORMAL HIGH (ref 70–99)
Glucose-Capillary: 256 mg/dL — ABNORMAL HIGH (ref 70–99)

## 2014-04-29 LAB — URINE MICROSCOPIC-ADD ON

## 2014-04-29 LAB — HCG, QUANTITATIVE, PREGNANCY

## 2014-04-29 LAB — I-STAT BETA HCG BLOOD, ED (MC, WL, AP ONLY)

## 2014-04-29 LAB — ETHANOL

## 2014-04-29 MED ORDER — RISPERIDONE 1 MG PO TABS
1.0000 mg | ORAL_TABLET | Freq: Two times a day (BID) | ORAL | Status: DC
  Administered 2014-04-30: 1 mg via ORAL
  Filled 2014-04-29 (×2): qty 1

## 2014-04-29 MED ORDER — INSULIN ASPART 100 UNIT/ML ~~LOC~~ SOLN
0.0000 [IU] | Freq: Three times a day (TID) | SUBCUTANEOUS | Status: DC
  Administered 2014-04-29: 5 [IU] via SUBCUTANEOUS
  Administered 2014-04-29: 11 [IU] via SUBCUTANEOUS
  Administered 2014-04-30 (×2): 8 [IU] via SUBCUTANEOUS
  Filled 2014-04-29 (×4): qty 1

## 2014-04-29 MED ORDER — INSULIN ASPART 100 UNIT/ML ~~LOC~~ SOLN: 0.0000 [IU] | Freq: Three times a day (TID) | SUBCUTANEOUS | Status: DC

## 2014-04-29 MED ORDER — ALBUTEROL SULFATE HFA 108 (90 BASE) MCG/ACT IN AERS
1.0000 | INHALATION_SPRAY | Freq: Four times a day (QID) | RESPIRATORY_TRACT | Status: DC | PRN
  Administered 2014-04-30: 2 via RESPIRATORY_TRACT
  Administered 2014-04-30: 1 via RESPIRATORY_TRACT
  Filled 2014-04-29: qty 6.7

## 2014-04-29 MED ORDER — ZIPRASIDONE MESYLATE 20 MG IM SOLR
10.0000 mg | Freq: Once | INTRAMUSCULAR | Status: AC
  Administered 2014-04-29: 10 mg via INTRAMUSCULAR
  Filled 2014-04-29: qty 20

## 2014-04-29 MED ORDER — ZOLPIDEM TARTRATE 5 MG PO TABS
5.0000 mg | ORAL_TABLET | Freq: Every evening | ORAL | Status: DC | PRN
  Administered 2014-04-30: 5 mg via ORAL
  Filled 2014-04-29: qty 1

## 2014-04-29 MED ORDER — SODIUM CHLORIDE 0.9 % IV SOLN
1000.0000 mL | Freq: Once | INTRAVENOUS | Status: AC
  Administered 2014-04-29: 1000 mL via INTRAVENOUS

## 2014-04-29 MED ORDER — ZIPRASIDONE MESYLATE 20 MG IM SOLR
INTRAMUSCULAR | Status: AC
  Filled 2014-04-29: qty 20

## 2014-04-29 MED ORDER — IBUPROFEN 200 MG PO TABS
600.0000 mg | ORAL_TABLET | Freq: Three times a day (TID) | ORAL | Status: DC | PRN
  Administered 2014-04-29 – 2014-04-30 (×2): 600 mg via ORAL
  Filled 2014-04-29 (×2): qty 3

## 2014-04-29 MED ORDER — LORAZEPAM 1 MG PO TABS: 1.0000 mg | ORAL_TABLET | Freq: Three times a day (TID) | ORAL | Status: DC | PRN

## 2014-04-29 MED ORDER — ONDANSETRON HCL 4 MG PO TABS: 4.0000 mg | ORAL_TABLET | Freq: Three times a day (TID) | ORAL | Status: DC | PRN

## 2014-04-29 MED ORDER — INSULIN ASPART 100 UNIT/ML ~~LOC~~ SOLN
8.0000 [IU] | Freq: Once | SUBCUTANEOUS | Status: AC
  Administered 2014-04-29: 8 [IU] via INTRAVENOUS
  Filled 2014-04-29: qty 1

## 2014-04-29 MED ORDER — GABAPENTIN 300 MG PO CAPS
300.0000 mg | ORAL_CAPSULE | Freq: Every day | ORAL | Status: DC
  Administered 2014-04-29 – 2014-04-30 (×2): 300 mg via ORAL
  Filled 2014-04-29 (×2): qty 1

## 2014-04-29 MED ORDER — ZIPRASIDONE MESYLATE 20 MG IM SOLR
20.0000 mg | Freq: Once | INTRAMUSCULAR | Status: AC
  Administered 2014-04-29: 20 mg via INTRAMUSCULAR
  Filled 2014-04-29: qty 20

## 2014-04-29 MED ORDER — HYDROXYZINE HCL 25 MG PO TABS
25.0000 mg | ORAL_TABLET | Freq: Four times a day (QID) | ORAL | Status: DC | PRN
Start: 2014-04-29 — End: 2014-04-30
  Administered 2014-04-29 – 2014-04-30 (×3): 25 mg via ORAL
  Filled 2014-04-29 (×3): qty 1

## 2014-04-29 MED ORDER — METFORMIN HCL 500 MG PO TABS
1000.0000 mg | ORAL_TABLET | Freq: Two times a day (BID) | ORAL | Status: DC
  Administered 2014-04-29 – 2014-04-30 (×3): 1000 mg via ORAL
  Filled 2014-04-29 (×6): qty 2

## 2014-04-29 MED ORDER — STERILE WATER FOR INJECTION IJ SOLN
INTRAMUSCULAR | Status: AC
  Administered 2014-04-29: 03:00:00
  Filled 2014-04-29: qty 10

## 2014-04-29 MED ORDER — INSULIN ASPART 100 UNIT/ML ~~LOC~~ SOLN
0.0000 [IU] | Freq: Every day | SUBCUTANEOUS | Status: DC
  Administered 2014-04-29: 3 [IU] via SUBCUTANEOUS
  Filled 2014-04-29: qty 1

## 2014-04-29 MED ORDER — GLUCERNA SHAKE PO LIQD
237.0000 mL | Freq: Three times a day (TID) | ORAL | Status: DC
  Administered 2014-04-29 – 2014-04-30 (×2): 237 mL via ORAL
  Filled 2014-04-29 (×4): qty 237

## 2014-04-29 MED ORDER — ACETAMINOPHEN 325 MG PO TABS: 650.0000 mg | ORAL_TABLET | ORAL | Status: DC | PRN

## 2014-04-29 MED ORDER — ZIPRASIDONE MESYLATE 20 MG IM SOLR
20.0000 mg | Freq: Once | INTRAMUSCULAR | Status: AC
  Administered 2014-04-29: 20 mg via INTRAMUSCULAR

## 2014-04-29 MED ORDER — STERILE WATER FOR INJECTION IJ SOLN
INTRAMUSCULAR | Status: AC
  Administered 2014-04-29: 20:00:00
  Filled 2014-04-29: qty 10

## 2014-04-29 MED ORDER — NICOTINE 21 MG/24HR TD PT24: 21.0000 mg | MEDICATED_PATCH | Freq: Every day | TRANSDERMAL | Status: DC

## 2014-04-29 MED ORDER — SODIUM CHLORIDE 0.9 % IV SOLN: 1000.0000 mL | INTRAVENOUS | Status: DC

## 2014-04-29 MED ORDER — RISPERIDONE 1 MG PO TABS
1.0000 mg | ORAL_TABLET | Freq: Every day | ORAL | Status: DC
  Administered 2014-04-29: 1 mg via ORAL

## 2014-04-29 NOTE — ED Notes (Signed)
Pt slamming doors and things in triage rm3, pt moved to rm 16, pt is IVC and [employer] currently at bedside

## 2014-04-29 NOTE — ED Notes (Addendum)
Pt has two belongings bags placed in [address]  Bag 1 Black tennis shoes Wallace Cullens pants Green top [employer] Pocketbook (NOTE: Large quantity of cash and some other valuables stored in safe with Security) 2 plastic bags filled with cigarettes, candy, snacks and drinks from [employer] 2 2 plastic bags filled with cigarettes, candy, snacks and drinks from [employer]

## 2014-04-29 NOTE — ED Notes (Signed)
Per Dr. Norlene Campbell hold BMP and Hgb A1C until Bolus' are in.

## 2014-04-29 NOTE — BH Assessment (Signed)
Assessment completed. Consulted Verne Spurr, PA-C who recommended inpatient treatment. TTS will contact other facilities for placement. Dr.[name] has been informed of the recommendation.

## 2014-04-29 NOTE — ED Notes (Signed)
IVC paperwork and Security key given to [facility].
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
## 2014-04-29 NOTE — ED Notes (Signed)
Patient became argumentative and stated she wants to leave and does not want to go to IP treatment.  Patient threw the comb for her hair and went in to the bathroom and started pounding the walls with her fists.  Patient then went back in to her room and feigned hitting her head on the extension for a television.  Patient tore the mattress off her bed and returned to the bathroom where she squirted body lotion on the bathroom floor.  Patient was asked to stop those behaviors and she said, "I just want to fucking die.  I hate being me."  Order for Geodon obtained from Dr. Rhunette Croft.  Patient allowed RN to give 20 mg of Geodon IM in right hip.  Patient now resting on bed.  Security and [employer] at bedside.

## 2014-04-29 NOTE — ED Notes (Signed)
Pt transferred from TCU, report from [employer], pt reports she is here because she is 51mos pregnant.  Pregnancy test confirmed negative.  Denies SI, HI or hallucinations, pt given Geodon in TCU.  Meal given, pt resting at present.

## 2014-04-29 NOTE — ED Notes (Signed)
Patient sleeping soundly with snoring respirations after eating all of her lunch.  Patient's skin warm and dry.

## 2014-04-29 NOTE — ED Notes (Addendum)
Per Night Shift RN, all labs are to be held until NS boluses are complete.

## 2014-04-29 NOTE — BH Assessment (Signed)
Unable to complete assessment at this time. Spoke with [facility] who reported that pt was given Geodon. Will assess when pt is alert.

## 2014-04-29 NOTE — ED Notes (Signed)
Patient's belongings are in locker 26.

## 2014-04-29 NOTE — ED Provider Notes (Addendum)
Assuming care of patient from Dr. Marian Sorrow. Patient in the ED, noted to be manic by [facility]. [name]. TTS was consulted, pt awaiting bed. Pt also has niddm, not taking her meds. Noted to be hyperglycemic with a gap and low bicarb. Pt has anion gap. Insulin protocol started.  Workup thus far is otherwise neg. Plan is to reduce the gap, and start pt on orals, including metformin 1000 mg bid. Patient had no complains, no concerns from the nursing side. Will continue to monitor.  9:03 AM Gap closed. Started metformin 1000 mg bid. Insulin sliding scale continued. Medically cleared. Pt eating food.  Derwood Kaplan, MD 04/29/14 [id number]  Derwood Kaplan, MD 04/29/14 [phone]

## 2014-04-29 NOTE — ED Notes (Signed)
Pt two belongings bags placed in locker 27.

## 2014-04-29 NOTE — Progress Notes (Signed)
Pt has been assessed and meets inpatient criteria for psychiatric treatment. Pt is IVC'd. Pt.'s clinicals faxed to:  [facility] [place]  Will continue to seek placement.  Derrell Lolling, MSW Social Worker [phone]

## 2014-04-29 NOTE — ED Notes (Signed)
Patient refused lab draw at this time. RN made aware. 

## 2014-04-29 NOTE — ED Notes (Signed)
MD at bedside. 

## 2014-04-29 NOTE — ED Notes (Signed)
Psych team at bedside .

## 2014-04-29 NOTE — ED Notes (Signed)
Patient transitioned to TCU.  Patient oriented to person, place, time and events.  Patient denies SI/HI, the use of ETOH and illicit or prescription drugs.  Patient has a place on the under side of her right tongue that looks as though she bit it.  Patient believes it may be cancerous.  Patient states she is adopted and doesn't know her real family.  Patient states she has 4 children and is divorced/estranged from their father.  Patient states she is here because she cannot sleep and "my mind is racing."

## 2014-04-29 NOTE — ED Notes (Signed)
Unable to get blood sugar and blood due to patient being aggressive

## 2014-04-29 NOTE — ED Notes (Signed)
Patient given a lunch tray consisting of chicken breast, mashed potatoes, fruit and bread.  Patient ate her entire meal along with a caffeine free diet coke.
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
## 2014-04-29 NOTE — ED Notes (Signed)
Pt is becoming increasingly agitated.  She walked out to the the nurses' station and stated that she is ready to leave.  This RN informed her that unfortunately she can not leave, because she was IVC'd and the psychiatrist will have to see her.  The Pt then stated that she needs to shower because she is itching.  This RN offered to provide soap, wash, cloths, and towels.

## 2014-04-29 NOTE — ED Notes (Signed)
Patient resting with eyes closed RR WNL--even and unlabored with equal rise and fall of chest Patient awakened during VS--denies c/o pain or additional needs at this time VS updated and stable NS bolus continues to infuse Patient in NAD Side rails up, call bell in reach

## 2014-04-29 NOTE — ED Notes (Signed)
IV removed before pt was moved to TCU without complications

## 2014-04-29 NOTE — ED Notes (Signed)
Pt waiting in lobby for cab and requested again to be evaluated. Pt is confused and delusional believing she is pregnant, has cancer, and many other ailments. Pt denies SI/HI.

## 2014-04-29 NOTE — ED Notes (Signed)
Attempted to draw blood multiple times by RN and 2 phlebotomist. RN able to obtain one dark green tube before pt pulled away removing the needle. Pt will be given Geodon that was ordered and labs drawn once pt is more calm and cooperative.
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
## 2014-04-29 NOTE — Progress Notes (Signed)
  CARE MANAGEMENT ED NOTE 04/29/2014  Patient:  [name] [name], [name] [name]   Account Number:  [id number]  Date Initiated:  04/29/2014  Documentation initiated by:  Edd Arbour  Subjective/Objective Assessment:   43 yr old [place] access brought in by [employer]  Pt states she does not want to be here and does not wish to be seen waiting in lobby for cab and requested again to be evaluated. Pt is confused and delusional believing she is pregnant,     Subjective/Objective Assessment Detail:   In [facility] ED pt began slamming doors and items IVC'd    [facility]  [address] [place] [phone]      EPIC indicates pt has not had visits to pcp only ED visits throughout 2015 As of 04/2014 pt has 2 ED visits with no admission in last 6 months, within last week pt seen in ED 04/28/14 & 04/29/14  Per [facility] ED BH progression pt meeting inpatient criteria for psychiatric treatment and has been faxed out to facilities with pending response  dx depressive disorder     Action/Plan:   Pt reviewed in ED BH progression meeting, noted d/c plan pcp updated in EPIC   Action/Plan Detail:   Anticipated DC Date:       Status Recommendation to Physician:   Result of Recommendation:    Other ED Services  Consult Working Plan    DC Associate Professor  Other  Outpatient Services - Pt will follow up  PCP issues    Choice offered to / List presented to:            Status of service:  Completed, signed off  ED Comments:   ED Comments Detail:  Follow-up With Details Why Contact Info follow with medicaid pcp listed on medicaid card   [facility]  [address] [place] [phone]

## 2014-04-29 NOTE — ED Notes (Signed)
Pt brought in by [employer]  Pt states she does not want to be here and does not wish to be seen  Pt denies SI/HI  Taxi called for pt  Police with pt

## 2014-04-30 LAB — CBG MONITORING, ED
GLUCOSE-CAPILLARY: 285 mg/dL — AB (ref 70–99)
Glucose-Capillary: 264 mg/dL — ABNORMAL HIGH (ref 70–99)

## 2014-04-30 MED ORDER — LOPERAMIDE HCL 2 MG PO CAPS
4.0000 mg | ORAL_CAPSULE | Freq: Once | ORAL | Status: AC
  Administered 2014-04-30: 4 mg via ORAL
  Filled 2014-04-30: qty 2

## 2014-04-30 MED ORDER — LORAZEPAM 2 MG/ML IJ SOLN
2.0000 mg | Freq: Once | INTRAMUSCULAR | Status: AC
  Administered 2014-04-30: 2 mg via INTRAMUSCULAR
  Filled 2014-04-30: qty 1

## 2014-04-30 NOTE — ED Notes (Signed)
Pt in room after using the phone, became upset yelling and throwing things in room.  Pt also had a episode of diarrhea. Pt reports that she has been having diarrhea.  Catha Nottingham NP aware, meds as ordered

## 2014-04-30 NOTE — ED Notes (Signed)
Pt reports chronic lower back/lt leg pain. 10/10.  Pt sitting smiling

## 2014-04-30 NOTE — ED Notes (Signed)
Up tot he bathroom to shower and change scrubs 

## 2014-04-30 NOTE — Progress Notes (Signed)
9:00am. Per Tamela Oddi RN, pt has been accepted to [facility] by Dr. Lauretta Chester. When pt is ready for transport, please call report to [phone].   Mariann Laster,     ED CSW  phone: [phone]
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
## 2014-04-30 NOTE — Progress Notes (Signed)
Pt crying and throwing things in room. Pt threw hair comb towards this writer's head.

## 2014-04-30 NOTE — ED Notes (Addendum)
Crying in room, support given, medication given, pt began to smile and less upset.

## 2014-04-30 NOTE — Progress Notes (Signed)
[facility] contacted the following psychiatric facilities for placement:  [facility] [place] 1)[facility] 2)[facility] 3)[facility] 4)[facility] 5)[facility] 6)[facility] 7)[facility] 8)[facility] 9)[facility] 10)[facility] 11)[facility] 12)[facility]  DECLINED 1)[facility]  FAXED REFERRALS 1)[facility] 2)[facility] 3)[facility] 4)[facility] 5)[facility]  Blain Pais, MHT/NS
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
## 2014-04-30 NOTE — ED Notes (Signed)
Up to the bathroom 

## 2014-04-30 NOTE — ED Notes (Signed)
Up on the phone 
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
## 2014-05-12 ENCOUNTER — Emergency Department (HOSPITAL_COMMUNITY)
Admission: EM | Admit: 2014-05-12 | Discharge: 2014-05-13 | Disposition: A | Discharge status: Home / Self Care | Payer: Medicaid Other | Attending: Emergency Medicine | Admitting: Emergency Medicine

## 2014-05-12 ENCOUNTER — Encounter (HOSPITAL_COMMUNITY): Payer: Self-pay | Admitting: *Deleted

## 2014-05-12 DIAGNOSIS — Z720 Tobacco use: Secondary | ICD-10-CM | POA: Diagnosis not present

## 2014-05-12 DIAGNOSIS — F419 Anxiety disorder, unspecified: Secondary | ICD-10-CM | POA: Insufficient documentation

## 2014-05-12 DIAGNOSIS — J45909 Unspecified asthma, uncomplicated: Secondary | ICD-10-CM | POA: Insufficient documentation

## 2014-05-12 DIAGNOSIS — E1165 Type 2 diabetes mellitus with hyperglycemia: Secondary | ICD-10-CM | POA: Insufficient documentation

## 2014-05-12 DIAGNOSIS — R739 Hyperglycemia, unspecified: Secondary | ICD-10-CM

## 2014-05-12 DIAGNOSIS — G43909 Migraine, unspecified, not intractable, without status migrainosus: Secondary | ICD-10-CM | POA: Insufficient documentation

## 2014-05-12 DIAGNOSIS — F319 Bipolar disorder, unspecified: Secondary | ICD-10-CM | POA: Insufficient documentation

## 2014-05-12 DIAGNOSIS — Z79899 Other long term (current) drug therapy: Secondary | ICD-10-CM | POA: Diagnosis not present

## 2014-05-12 DIAGNOSIS — Z760 Encounter for issue of repeat prescription: Secondary | ICD-10-CM | POA: Diagnosis not present

## 2014-05-12 DIAGNOSIS — G8929 Other chronic pain: Secondary | ICD-10-CM

## 2014-05-12 DIAGNOSIS — N390 Urinary tract infection, site not specified: Secondary | ICD-10-CM | POA: Diagnosis not present

## 2014-05-12 DIAGNOSIS — Z3202 Encounter for pregnancy test, result negative: Secondary | ICD-10-CM | POA: Insufficient documentation

## 2014-05-12 LAB — CBC WITH DIFFERENTIAL/PLATELET
Basophils Absolute: 0 10*3/uL (ref 0.0–0.1)
Basophils Relative: 0 % (ref 0–1)
Eosinophils Absolute: 0.1 10*3/uL (ref 0.0–0.7)
Eosinophils Relative: 1 % (ref 0–5)
HCT: 42.2 % (ref 36.0–46.0)
Hemoglobin: 14.4 g/dL (ref 12.0–15.0)
LYMPHS ABS: 2.8 10*3/uL (ref 0.7–4.0)
LYMPHS PCT: 33 % (ref 12–46)
MCH: 29.8 pg (ref 26.0–34.0)
MCHC: 34.1 g/dL (ref 30.0–36.0)
MCV: 87.2 fL (ref 78.0–100.0)
Monocytes Absolute: 0.5 10*3/uL (ref 0.1–1.0)
Monocytes Relative: 6 % (ref 3–12)
NEUTROS ABS: 5.1 10*3/uL (ref 1.7–7.7)
Neutrophils Relative %: 60 % (ref 43–77)
Platelets: 230 10*3/uL (ref 150–400)
RBC: 4.84 MIL/uL (ref 3.87–5.11)
RDW: 13 % (ref 11.5–15.5)
WBC: 8.4 10*3/uL (ref 4.0–10.5)

## 2014-05-12 LAB — COMPREHENSIVE METABOLIC PANEL
ALK PHOS: 84 U/L (ref 39–117)
ALT: 19 U/L (ref 0–35)
AST: 12 U/L (ref 0–37)
Albumin: 3.8 g/dL (ref 3.5–5.2)
Anion gap: 17 — ABNORMAL HIGH (ref 5–15)
BUN: 8 mg/dL (ref 6–23)
CHLORIDE: 99 meq/L (ref 96–112)
CO2: 22 mEq/L (ref 19–32)
Calcium: 9.4 mg/dL (ref 8.4–10.5)
Creatinine, Ser: 0.8 mg/dL (ref 0.50–1.10)
GFR calc non Af Amer: 88 mL/min — ABNORMAL LOW (ref >90)
GLUCOSE: 386 mg/dL — AB (ref 70–99)
POTASSIUM: 3.9 meq/L (ref 3.7–5.3)
Sodium: 138 mEq/L (ref 137–147)
Total Bilirubin: <0.2 mg/dL — ABNORMAL LOW (ref 0.3–1.2)
Total Protein: 7.2 g/dL (ref 6.0–8.3)

## 2014-05-12 LAB — URINE MICROSCOPIC-ADD ON

## 2014-05-12 LAB — URINALYSIS, ROUTINE W REFLEX MICROSCOPIC
BILIRUBIN URINE: NEGATIVE
Hgb urine dipstick: NEGATIVE
KETONES UR: 15 mg/dL — AB
LEUKOCYTES UA: NEGATIVE
Nitrite: POSITIVE — AB
Protein, ur: NEGATIVE mg/dL
Specific Gravity, Urine: 1.036 — ABNORMAL HIGH (ref 1.005–1.030)
Urobilinogen, UA: 0.2 mg/dL (ref 0.0–1.0)
pH: 6.5 (ref 5.0–8.0)

## 2014-05-12 LAB — CBG MONITORING, ED: Glucose-Capillary: 367 mg/dL — ABNORMAL HIGH (ref 70–99)

## 2014-05-12 LAB — POC URINE PREG, ED: Preg Test, Ur: NEGATIVE

## 2014-05-12 MED ORDER — SODIUM CHLORIDE 0.9 % IV BOLUS (SEPSIS)
1000.0000 mL | Freq: Once | INTRAVENOUS | Status: AC
  Administered 2014-05-12: 1000 mL via INTRAVENOUS

## 2014-05-12 MED ORDER — CEPHALEXIN 250 MG PO CAPS: 500.0000 mg | ORAL_CAPSULE | Freq: Once | ORAL | Status: DC

## 2014-05-12 MED ORDER — SODIUM CHLORIDE 0.9 % IV BOLUS (SEPSIS): 1000.0000 mL | Freq: Once | INTRAVENOUS | Status: DC

## 2014-05-12 MED ORDER — LISINOPRIL 10 MG PO TABS: 10.0000 mg | ORAL_TABLET | Freq: Every day | ORAL | Status: DC

## 2014-05-12 MED ORDER — METFORMIN HCL 1000 MG PO TABS: 1000.0000 mg | ORAL_TABLET | Freq: Two times a day (BID) | ORAL | Status: DC

## 2014-05-12 MED ORDER — PRAVASTATIN SODIUM 20 MG PO TABS: 20.0000 mg | ORAL_TABLET | Freq: Every day | ORAL | Status: DC

## 2014-05-12 MED ORDER — IBUPROFEN 800 MG PO TABS
ORAL_TABLET | ORAL | Status: AC
  Filled 2014-05-12: qty 1

## 2014-05-12 MED ORDER — ZIPRASIDONE HCL 60 MG PO CAPS
60.0000 mg | ORAL_CAPSULE | Freq: Two times a day (BID) | ORAL | Status: DC
Start: 2014-05-12 — End: 2018-01-06

## 2014-05-12 MED ORDER — DIVALPROEX SODIUM ER 500 MG PO TB24: 1000.0000 mg | ORAL_TABLET | Freq: Every day | ORAL | Status: DC

## 2014-05-12 MED ORDER — ALBUTEROL SULFATE HFA 108 (90 BASE) MCG/ACT IN AERS: 1.0000 | INHALATION_SPRAY | Freq: Four times a day (QID) | RESPIRATORY_TRACT | Status: DC | PRN

## 2014-05-12 MED ORDER — GABAPENTIN 300 MG PO CAPS: 300.0000 mg | ORAL_CAPSULE | Freq: Three times a day (TID) | ORAL | Status: DC

## 2014-05-12 MED ORDER — IBUPROFEN 800 MG PO TABS
800.0000 mg | ORAL_TABLET | Freq: Once | ORAL | Status: AC
  Administered 2014-05-12: 800 mg via ORAL

## 2014-05-12 MED ORDER — CEPHALEXIN 250 MG PO CAPS
ORAL_CAPSULE | ORAL | Status: AC
  Filled 2014-05-12: qty 2

## 2014-05-12 MED ORDER — GLIPIZIDE 10 MG PO TABS: 10.0000 mg | ORAL_TABLET | Freq: Two times a day (BID) | ORAL | Status: DC

## 2014-05-12 MED ORDER — CEPHALEXIN 500 MG PO CAPS: 500.0000 mg | ORAL_CAPSULE | Freq: Two times a day (BID) | ORAL | Status: DC
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
## 2014-05-12 NOTE — Discharge Instructions (Signed)
Hyperglycemia Hyperglycemia occurs when the glucose (sugar) in your blood is too high. Hyperglycemia can happen for many reasons, but it most often happens to people who do not know they have diabetes or are not managing their diabetes properly.  CAUSES  Whether you have diabetes or not, there are other causes of hyperglycemia. Hyperglycemia can occur when you have diabetes, but it can also occur in other situations that you might not be as aware of, such as: Diabetes  If you have diabetes and are having problems controlling your blood glucose, hyperglycemia could occur because of some of the following reasons:  Not following your meal plan.  Not taking your diabetes medications or not taking it properly.  Exercising less or doing less activity than you normally do.  Being sick. Pre-diabetes  This cannot be ignored. Before people develop Type 2 diabetes, they almost always have "pre-diabetes." This is when your blood glucose levels are higher than normal, but not yet high enough to be diagnosed as diabetes. Research has shown that some long-term damage to the body, especially the heart and circulatory system, may already be occurring during pre-diabetes. If you take action to manage your blood glucose when you have pre-diabetes, you may delay or prevent Type 2 diabetes from developing. Stress  If you have diabetes, you may be "diet" controlled or on oral medications or insulin to control your diabetes. However, you may find that your blood glucose is higher than usual in the hospital whether you have diabetes or not. This is often referred to as "stress hyperglycemia." Stress can elevate your blood glucose. This happens because of hormones put out by the body during times of stress. If stress has been the cause of your high blood glucose, it can be followed regularly by your caregiver. That way he/she can make sure your hyperglycemia does not continue to get worse or progress to  diabetes. Steroids  Steroids are medications that act on the infection fighting system (immune system) to block inflammation or infection. One side effect can be a rise in blood glucose. Most people can produce enough extra insulin to allow for this rise, but for those who cannot, steroids make blood glucose levels go even higher. It is not unusual for steroid treatments to "uncover" diabetes that is developing. It is not always possible to determine if the hyperglycemia will go away after the steroids are stopped. A special blood test called an A1c is sometimes done to determine if your blood glucose was elevated before the steroids were started. SYMPTOMS  Thirsty.  Frequent urination.  Dry mouth.  Blurred vision.  Tired or fatigue.  Weakness.  Sleepy.  Tingling in feet or leg. DIAGNOSIS  Diagnosis is made by monitoring blood glucose in one or all of the following ways:  A1c test. This is a chemical found in your blood.  Fingerstick blood glucose monitoring.  Laboratory results. TREATMENT  First, knowing the cause of the hyperglycemia is important before the hyperglycemia can be treated. Treatment may include, but is not be limited to:  Education.  Change or adjustment in medications.  Change or adjustment in meal plan.  Treatment for an illness, infection, etc.  More frequent blood glucose monitoring.  Change in exercise plan.  Decreasing or stopping steroids.  Lifestyle changes. HOME CARE INSTRUCTIONS   Test your blood glucose as directed.  Exercise regularly. Your caregiver will give you instructions about exercise. Pre-diabetes or diabetes which comes on with stress is helped by exercising.  Eat  wholesome, balanced meals. Eat often and at regular, fixed times. Your caregiver or nutritionist will give you a meal plan to guide your sugar intake.  Being at an ideal weight is important. If needed, losing as little as 10 to 15 pounds may help improve blood  glucose levels. SEEK MEDICAL CARE IF:   You have questions about medicine, activity, or diet.  You continue to have symptoms (problems such as increased thirst, urination, or weight gain). SEEK IMMEDIATE MEDICAL CARE IF:   You are vomiting or have diarrhea.  Your breath smells fruity.  You are breathing faster or slower.  You are very sleepy or incoherent.  You have numbness, tingling, or pain in your feet or hands.  You have chest pain.  Your symptoms get worse even though you have been following your caregiver's orders.  If you have any other questions or concerns. Document Released: 11/20/2000 Document Revised: 08/19/2011 Document Reviewed: 09/23/2011 [facility] Patient Information 2015 [place], [place]. This information is not intended to replace advice given to you by your health care provider. Make sure you discuss any questions you have with your health care provider.  Urinary Tract Infection Urinary tract infections (UTIs) can develop anywhere along your urinary tract. Your urinary tract is your body's drainage system for removing wastes and extra water. Your urinary tract includes two kidneys, two ureters, a bladder, and a urethra. Your kidneys are a pair of bean-shaped organs. Each kidney is about the size of your fist. They are located below your ribs, one on each side of your spine. CAUSES Infections are caused by microbes, which are microscopic organisms, including fungi, viruses, and bacteria. These organisms are so small that they can only be seen through a microscope. Bacteria are the microbes that most commonly cause UTIs. SYMPTOMS  Symptoms of UTIs may vary by age and gender of the patient and by the location of the infection. Symptoms in young women typically include a frequent and intense urge to urinate and a painful, burning feeling in the bladder or urethra during urination. Older women and men are more likely to be tired, shaky, and weak and have muscle aches and  abdominal pain. A fever may mean the infection is in your kidneys. Other symptoms of a kidney infection include pain in your back or sides below the ribs, nausea, and vomiting. DIAGNOSIS To diagnose a UTI, your caregiver will ask you about your symptoms. Your caregiver also will ask to provide a urine sample. The urine sample will be tested for bacteria and white blood cells. White blood cells are made by your body to help fight infection. TREATMENT  Typically, UTIs can be treated with medication. Because most UTIs are caused by a bacterial infection, they usually can be treated with the use of antibiotics. The choice of antibiotic and length of treatment depend on your symptoms and the type of bacteria causing your infection. HOME CARE INSTRUCTIONS  If you were prescribed antibiotics, take them exactly as your caregiver instructs you. Finish the medication even if you feel better after you have only taken some of the medication.  Drink enough water and fluids to keep your urine clear or pale yellow.  Avoid caffeine, tea, and carbonated beverages. They tend to irritate your bladder.  Empty your bladder often. Avoid holding urine for long periods of time.  Empty your bladder before and after sexual intercourse.  After a bowel movement, women should cleanse from front to back. Use each tissue only once. SEEK MEDICAL CARE IF:  You have back pain.  You develop a fever.  Your symptoms do not begin to resolve within 3 days. SEEK IMMEDIATE MEDICAL CARE IF:   You have severe back pain or lower abdominal pain.  You develop chills.  You have nausea or vomiting.  You have continued burning or discomfort with urination. MAKE SURE YOU:   Understand these instructions.  Will watch your condition.  Will get help right away if you are not doing well or get worse. Document Released: 03/06/2005 Document Revised: 11/26/2011 Document Reviewed: 07/05/2011 [facility] Patient Information 2015  [place], [place]. This information is not intended to replace advice given to you by your health care provider. Make sure you discuss any questions you have with your health care provider.  Chronic Pain Chronic pain can be defined as pain that is off and on and lasts for 3-6 months or longer. Many things cause chronic pain, which can make it difficult to make a diagnosis. There are many treatment options available for chronic pain. However, finding a treatment that works well for you may require trying various approaches until the right one is found. Many people benefit from a combination of two or more types of treatment to control their pain. SYMPTOMS  Chronic pain can occur anywhere in the body and can range from mild to very severe. Some types of chronic pain include:  Headache.  Low back pain.  Cancer pain.  Arthritis pain.  Neurogenic pain. This is pain resulting from damage to nerves. People with chronic pain may also have other symptoms such as:  Depression.  Anger.  Insomnia.  Anxiety. DIAGNOSIS  Your health care provider will help diagnose your condition over time. In many cases, the initial focus will be on excluding possible conditions that could be causing the pain. Depending on your symptoms, your health care provider may order tests to diagnose your condition. Some of these tests may include:   Blood tests.   CT scan.   MRI.   X-rays.   Ultrasounds.   Nerve conduction studies.  You may need to see a specialist.  TREATMENT  Finding treatment that works well may take time. You may be referred to a pain specialist. He or she may prescribe medicine or therapies, such as:   Mindful meditation or yoga.  Shots (injections) of numbing or pain-relieving medicines into the spine or area of pain.  Local electrical stimulation.  Acupuncture.   Massage therapy.   Aroma, color, light, or sound therapy.   Biofeedback.   Working with a physical  therapist to keep from getting stiff.   Regular, gentle exercise.   Cognitive or behavioral therapy.   Group support.  Sometimes, surgery may be recommended.  HOME CARE INSTRUCTIONS   Take all medicines as directed by your health care provider.   Lessen stress in your life by relaxing and doing things such as listening to calming music.   Exercise or be active as directed by your health care provider.   Eat a healthy diet and include things such as vegetables, fruits, fish, and lean meats in your diet.   Keep all follow-up appointments with your health care provider.   Attend a support group with others suffering from chronic pain. SEEK MEDICAL CARE IF:   Your pain gets worse.   You develop a new pain that was not there before.   You cannot tolerate medicines given to you by your health care provider.   You have new symptoms since your last visit with  your health care provider.  SEEK IMMEDIATE MEDICAL CARE IF:   You feel weak.   You have decreased sensation or numbness.   You lose control of bowel or bladder function.   Your pain suddenly gets much worse.   You develop shaking.  You develop chills.  You develop confusion.  You develop chest pain.  You develop shortness of breath.  MAKE SURE YOU:  Understand these instructions.  Will watch your condition.  Will get help right away if you are not doing well or get worse. Document Released: 02/16/2002 Document Revised: 01/27/2013 Document Reviewed: 11/20/2012 [facility] Patient Information 2015 [place]. This information is not intended to replace advice given to you by your health care provider. Make sure you discuss any questions you have with your health care provider.   Medication Refill, Emergency Department We have refilled your medication today as a courtesy to you. It is best for your medical care, however, to take care of getting refills done through your primary caregiver's  office. They have your records and can do a better job of follow-up than we can in the emergency department. On maintenance medications, we often only prescribe enough medications to get you by until you are able to see your regular caregiver. This is a more expensive way to refill medications. In the future, please plan for refills so that you will not have to use the emergency department for this. Thank you for your help. Your help allows us to better take care of the daily emergencies that enter our department. Document Released: 09/13/2003 Document Revised: 08/19/2011 Document Reviewed: 09/03/2013 [facility] Patient Information 2015 [place], [place]. This information is not intended to replace advice given to you by your health care provider. Make sure you discuss any questions you have with your health care provider.

## 2014-05-12 NOTE — ED Provider Notes (Signed)
TIME SEEN: 10:20 PM  CHIEF COMPLAINT: "I need my medications refilled"  HPI: Pt is a 43 y.o. F with history of non-insulin-dependent diabetes, asthma, bipolar disorder, migraines, chronic back pain who presents to the emergency department with complaints of needing her medications refilled. Patient was recently admitted to [facility] in [place] for psychosis. She reports that she was discharged home and the police officer that brought her home Her discharge paperwork with her prescriptions. She states that she was prescribed Percocet 10 mg every 4 hours as needed for one month. When asked why she is taking as much pain medication and she states that she has chronic back pain. No new back injury. No numbness, tingling or focal weakness.   Patient was found to be hyperglycemic in the ED as well. When asked about what other medication she is taking she seems much less concerned about her metformin, glipizide. She also seems very unconcerned about getting back on her psychiatric medications. She denies any SI or HI. No hallucinations.   No fevers, cough, vomiting or diarrhea, chest or abdominal pain.  ROS: See HPI Constitutional: no fever  Eyes: no drainage  ENT: no runny nose   Cardiovascular:  no chest pain  Resp: no SOB  GI: no vomiting GU: no dysuria Integumentary: no rash  Allergy: no hives  Musculoskeletal: no leg swelling  Neurological: no slurred speech ROS otherwise negative  PAST MEDICAL HISTORY/PAST SURGICAL HISTORY:  Past Medical History  Diagnosis Date  . Diabetes mellitus   . Asthma   . Anxiety disorder   . Bipolar affective disorder   . Depression   . Migraine     MEDICATIONS:  Prior to Admission medications   Medication Sig Start Date End Date Taking? Authorizing Provider  albuterol (PROVENTIL HFA;VENTOLIN HFA) 108 (90 BASE) MCG/ACT inhaler Inhale 2 puffs into the lungs every 6 (six) hours as needed for wheezing. 05/21/12   Hadassah Pais, MD  albuterol (PROVENTIL  HFA;VENTOLIN HFA) 108 (90 BASE) MCG/ACT inhaler Inhale 1-2 puffs into the lungs every 6 (six) hours as needed for wheezing. 08/28/12   Rosana Hoes, MD  amlexanox (APHTHASOL) 5 % paste Use as directed in the mouth or throat 4 (four) times daily. 06/06/12   Pedro Earls, MD  clonazePAM (KLONOPIN) 0.5 MG tablet Take 0.5 mg by mouth daily as needed for anxiety.    Historical Provider, MD  clonazePAM (KLONOPIN) 2 MG tablet Take 2 mg by mouth 2 (two) times daily as needed for anxiety.    Historical Provider, MD  gabapentin (NEURONTIN) 300 MG capsule TAKE 1 CAPSULE BY MOUTH DAILY 08/27/12   Pedro Earls, MD  glucose blood (ONE TOUCH ULTRA TEST) test strip Use as instructed 08/28/12   Rosana Hoes, MD  glucose monitoring kit (FREESTYLE) monitoring kit 1 each by Does not apply route as needed for other. 1 unit- 08/28/12   Rosana Hoes, MD  ibuprofen (ADVIL,MOTRIN) 800 MG tablet Take 800 mg by mouth every 8 (eight) hours as needed for moderate pain.    Historical Provider, MD  metFORMIN (GLUCOPHAGE) 1000 MG tablet Take 1 tablet (1,000 mg total) by mouth 2 (two) times daily with a meal. 06/01/12   Pedro Earls, MD  promethazine (PHENERGAN) 25 MG tablet Take 1 tablet (25 mg total) by mouth every 6 (six) hours as needed for nausea. 06/07/12   Garald Balding, NP  risperiDONE (RISPERDAL) 1 MG tablet Take 1 mg by mouth daily.    Historical Provider, MD    ALLERGIES:  Allergies  Allergen Reactions  . Tramadol Nausea Only    "feeling like I'm on fire"  . Nicotine     Patient states she is allergic to the Nicotine patch and nicotine gum and they cause her to break out in a rash.  She states she can smoke cigarettes.    SOCIAL HISTORY:  History  Substance Use Topics  . Smoking status: Current Every Day Smoker  . Smokeless tobacco: Never Used  . Alcohol Use: Yes     Comment: occasionally    FAMILY HISTORY: History reviewed. No pertinent family history.  EXAM: BP 142/90 mmHg  Pulse 123  Temp(Src) 98.4 F  (36.9 C) (Oral)  Resp 20  SpO2 96%  LMP 02/26/2014 (Approximate) CONSTITUTIONAL: Alert and oriented and responds appropriately to questions. Well-appearing; well-nourished HEAD: Normocephalic EYES: Conjunctivae clear, PERRL ENT: normal nose; no rhinorrhea; moist mucous membranes; pharynx without lesions noted NECK: Supple, no meningismus, no LAD  CARD: Regular and tachycardic; S1 and S2 appreciated; no murmurs, no clicks, no rubs, no gallops RESP: Normal chest excursion without splinting or tachypnea; breath sounds clear and equal bilaterally; no wheezes, no rhonchi, no rales, no hypoxia or respiratory distress ABD/GI: Normal bowel sounds; non-distended; soft, non-tender, no rebound, no guarding BACK:  The back appears normal and is non-tender to palpation, there is no CVA tenderness EXT: Normal ROM in all joints; non-tender to palpation; no edema; normal capillary refill; no cyanosis    SKIN: Normal color for age and race; warm NEURO: Moves all extremities equally, cranial nerves II through XII intact, sensation to light touch intact diffusely, normal gait PSYCH: The patient's mood and manner are appropriate. Grooming and personal hygiene are appropriate. No SI or HI. No hallucinations.  MEDICAL DECISION MAKING: Pt here requesting refills of her medications. Specifically she wants a refill for one month worth of Percocet 10 mg every 4 hours. States that her prescriptions were accidentally By the police officer that took her home when she was discharged from [facility].  She is requesting for something for pain in the ED but she drove herself here and plans to drive herself and her son who is at bedside home. Discussed with patient that we would not be able to provide her with narcotic medication and she is driving. Offer Tylenol and ibuprofen which she refuses. Have also discussed with patient that given I am unable to confirm that she lost this prescription for Percocet that I will not be able to  refill this. She does have a PCP, Dr. Nancy Fetter, who have instructed her to follow-up with.  According to the [facility] controlled substance database, patient receives regular Klonopin prescriptions from her PCP but is not on narcotics and has not had any filled recently.  She is hyperglycemic in the ED and has an anion gap of 17 but a normal bicarbonate. We'll give IV fluids and recheck her blood sugar. No symptoms to suggest DKA. She is mildly tachycardic but otherwise hemodynamically stable.   ED PROGRESS: 11:49 PM  Pt's CBG is now 295 (confirmed with nurse [place] labs not crossing over at this time). Heart rate has improved with IV fluids to the 90s. She is requesting to leave. She does appear to have a nitrite-positive UTI. Will discharge with Keflex. Culture pending.   Discussed with nursing staff at [facility].  Patient was discharged with prescriptions for albuterol, Depakote ER 1000 mg at bedtime, Neurontin 300 mg 3 times a day, glipizide 10 mg twice a  day, lisinopril 10 mg once daily, metformin 1000 mg twice a day, pravastatin 20 mg at bedtime, Geodon 60 mg twice a day. She was discharged with a prescription for Percocet 10/325 mg every 4 hours as needed for pain. Will refill all of her medications that I have confirmed with [facility] except for her Percocet. Have discussed with patient that she will need to have this refilled by her primary care physician as this is used to treat chronic pain and I cannot verify that her prescription was lost.  Urine drug screen pending. Patient refuses to wait until her Depakote level is back but given she has no signs of toxicity or feel she is safe to be discharged. Discussed return precautions and stressed the importance of outpatient follow-up.  [place], DO 05/13/14 [phone]

## 2014-05-12 NOTE — ED Notes (Signed)
Pt st's she just needed to get her Rx's.  Pt st's her blood sugar usually runs in the 500's.  No other complaints

## 2014-05-13 ENCOUNTER — Emergency Department (HOSPITAL_COMMUNITY)
Admission: EM | Admit: 2014-05-13 | Discharge: 2014-05-13 | Discharge status: Against Medical Advice | Payer: Medicaid Other | Attending: Emergency Medicine | Admitting: Emergency Medicine

## 2014-05-13 ENCOUNTER — Encounter (HOSPITAL_COMMUNITY): Payer: Self-pay | Admitting: *Deleted

## 2014-05-13 DIAGNOSIS — R000 Tachycardia, unspecified: Secondary | ICD-10-CM | POA: Insufficient documentation

## 2014-05-13 DIAGNOSIS — J45909 Unspecified asthma, uncomplicated: Secondary | ICD-10-CM | POA: Diagnosis not present

## 2014-05-13 DIAGNOSIS — Z79899 Other long term (current) drug therapy: Secondary | ICD-10-CM | POA: Insufficient documentation

## 2014-05-13 DIAGNOSIS — F319 Bipolar disorder, unspecified: Secondary | ICD-10-CM | POA: Diagnosis not present

## 2014-05-13 DIAGNOSIS — Z760 Encounter for issue of repeat prescription: Secondary | ICD-10-CM | POA: Diagnosis present

## 2014-05-13 DIAGNOSIS — Z720 Tobacco use: Secondary | ICD-10-CM | POA: Diagnosis not present

## 2014-05-13 DIAGNOSIS — E119 Type 2 diabetes mellitus without complications: Secondary | ICD-10-CM | POA: Insufficient documentation

## 2014-05-13 DIAGNOSIS — G43909 Migraine, unspecified, not intractable, without status migrainosus: Secondary | ICD-10-CM | POA: Diagnosis not present

## 2014-05-13 DIAGNOSIS — Z9119 Patient's noncompliance with other medical treatment and regimen: Secondary | ICD-10-CM

## 2014-05-13 DIAGNOSIS — Z9114 Patient's other noncompliance with medication regimen: Secondary | ICD-10-CM | POA: Insufficient documentation

## 2014-05-13 DIAGNOSIS — Z91199 Patient's noncompliance with other medical treatment and regimen due to unspecified reason: Secondary | ICD-10-CM

## 2014-05-13 LAB — CBG MONITORING, ED
GLUCOSE-CAPILLARY: 299 mg/dL — AB (ref 70–99)
Glucose-Capillary: 265 mg/dL — ABNORMAL HIGH (ref 70–99)
Glucose-Capillary: 247 mg/dL — ABNORMAL HIGH (ref 70–99)

## 2014-05-13 LAB — RAPID URINE DRUG SCREEN, HOSP PERFORMED
Amphetamines: NOT DETECTED
BARBITURATES: NOT DETECTED
BENZODIAZEPINES: NOT DETECTED
Cocaine: NOT DETECTED
Opiates: NOT DETECTED
Tetrahydrocannabinol: NOT DETECTED

## 2014-05-13 LAB — VALPROIC ACID LEVEL: Valproic Acid Lvl: <10 ug/mL — ABNORMAL LOW (ref 50.0–100.0)

## 2014-05-13 MED ORDER — METFORMIN HCL 1000 MG PO TABS: 1000.0000 mg | ORAL_TABLET | Freq: Two times a day (BID) | ORAL | Status: DC

## 2014-05-13 MED ORDER — SODIUM CHLORIDE 0.9 % IV BOLUS (SEPSIS): 1000.0000 mL | Freq: Once | INTRAVENOUS | Status: DC

## 2014-05-13 MED ORDER — CEPHALEXIN 500 MG PO CAPS: 500.0000 mg | ORAL_CAPSULE | Freq: Two times a day (BID) | ORAL | Status: DC

## 2014-05-13 MED ORDER — GLIPIZIDE 10 MG PO TABS: 10.0000 mg | ORAL_TABLET | Freq: Two times a day (BID) | ORAL | Status: DC

## 2014-05-13 MED ORDER — LISINOPRIL 10 MG PO TABS: 10.0000 mg | ORAL_TABLET | Freq: Every day | ORAL | Status: DC

## 2014-05-13 NOTE — ED Notes (Signed)
IV stick x 1 unsuccessful.  Patient screaming and singing and jerking while trying to put in IV.  Patient advised we need to get IV and blood, patient agreed to "calm down for a few minutes" and will try again.

## 2014-05-13 NOTE — ED Notes (Signed)
Patient states she lost all her prescriptions that she was given last night in ED.   Patient very anxious.

## 2014-05-13 NOTE — Discharge Instructions (Signed)
Please call your doctor for a followup appointment within 24-48 hours. When you talk to your doctor please let them know that you were seen in the emergency department and have them acquire all of your records so that they can discuss the findings with you and formulate a treatment plan to fully care for your new and ongoing problems. Please call and set-up an appointment with your primary care provider Please rest and stay hydrated Please avoid any physical or strenuous activity  Please take medications as prescribed Please continue to monitor symptoms closely and if symptoms are to worsen or change (fever greater than 101, chills, sweating, nausea, vomiting, chest pain, shortness of breathe, difficulty breathing, weakness, numbness, tingling, worsening or changes to pain pattern, headache, dizziness, loss of sensation, disorientation) please report back to the Emergency Department immediately.   Diabetes and Exercise Exercising regularly is important. It is not just about losing weight. It has many health benefits, such as:  Improving your overall fitness, flexibility, and endurance.  Increasing your bone density.  Helping with weight control.  Decreasing your body fat.  Increasing your muscle strength.  Reducing stress and tension.  Improving your overall health. People with diabetes who exercise gain additional benefits because exercise:  Reduces appetite.  Improves the body's use of blood sugar (glucose).  Helps lower or control blood glucose.  Decreases blood pressure.  Helps control blood lipids (such as cholesterol and triglycerides).  Improves the body's use of the hormone insulin by:  Increasing the body's insulin sensitivity.  Reducing the body's insulin needs.  Decreases the risk for heart disease because exercising:  Lowers cholesterol and triglycerides levels.  Increases the levels of good cholesterol (such as high-density lipoproteins [HDL]) in the  body.  Lowers blood glucose levels. YOUR ACTIVITY PLAN  Choose an activity that you enjoy and set realistic goals. Your health care provider or diabetes educator can help you make an activity plan that works for you. Exercise regularly as directed by your health care provider. This includes:  Performing resistance training twice a week such as push-ups, sit-ups, lifting weights, or using resistance bands.  Performing 150 minutes of cardio exercises each week such as walking, running, or playing sports.  Staying active and spending no more than 90 minutes at one time being inactive. Even short bursts of exercise are good for you. Three 10-minute sessions spread throughout the day are just as beneficial as a single 30-minute session. Some exercise ideas include:  Taking the dog for a walk.  Taking the stairs instead of the elevator.  Dancing to your favorite song.  Doing an exercise video.  Doing your favorite exercise with a friend. RECOMMENDATIONS FOR EXERCISING WITH TYPE 1 OR TYPE 2 DIABETES   Check your blood glucose before exercising. If blood glucose levels are greater than 240 mg/dL, check for urine ketones. Do not exercise if ketones are present.  Avoid injecting insulin into areas of the body that are going to be exercised. For example, avoid injecting insulin into:  The arms when playing tennis.  The legs when jogging.  Keep a record of:  Food intake before and after you exercise.  Expected peak times of insulin action.  Blood glucose levels before and after you exercise.  The type and amount of exercise you have done.  Review your records with your health care provider. Your health care provider will help you to develop guidelines for adjusting food intake and insulin amounts before and after exercising.  If you take  insulin or oral hypoglycemic agents, watch for signs and symptoms of hypoglycemia. They  include:  Dizziness.  Shaking.  Sweating.  Chills.  Confusion.  Drink plenty of water while you exercise to prevent dehydration or heat stroke. Body water is lost during exercise and must be replaced.  Talk to your health care provider before starting an exercise program to make sure it is safe for you. Remember, almost any type of activity is better than none. Document Released: 08/17/2003 Document Revised: 10/11/2013 Document Reviewed: 11/03/2012 [facility] Patient Information 2015 [place], [place]. This information is not intended to replace advice given to you by your health care provider. Make sure you discuss any questions you have with your health care provider.

## 2014-05-13 NOTE — ED Notes (Signed)
Patient stated that she doesn't want IV or blood taken at this time.   Patient wanted to leave without medications, but she did agree to stay.   Patient was "worried that her presents in her trailer would be stolen".

## 2014-05-13 NOTE — ED Notes (Signed)
Pt left without DC paperwork. Provider informed. Pt did not sign out.

## 2014-05-13 NOTE — ED Provider Notes (Signed)
CSN: [id number]     Arrival date & time 05/13/14  1152 History   First MD Initiated Contact with Patient 05/13/14 1218     Chief Complaint  Patient presents with  . Medication Refill     (Consider location/radiation/quality/duration/timing/severity/associated sxs/prior Treatment) The history is provided by the patient. No language interpreter was used.  [name] [name] is a 43 year old female with past medical history of diabetes, asthma, anxiety, bipolar disorder, depression, migraine presenting to the ED with request for medication refills. Patient reported that she was seen and assessed in ED setting last night, 05/12/2014, stated that she has lost her prescriptions and has not been able to get them filled secondary to not having the prescriptions. Patient reported that she was diagnosed with a bladder infection and has not been taking her antibiotics nor has she been taking her medications for diabetes. Denied alcohol, marijuana, heroin, cocaine use. Reported that she only smokes approximate one pack of cigarettes per day. Denied chest pain, shortness of breath, difficulty breathing, dizziness, numbness, tingling, nausea, vomiting, abdominal pain, dysuria, melena, hematochezia, sudden loss of vision, visual changes, fall, injury, increased depression, difficulty concentrating, homicidal or suicidal ideation. PCP Dr. Nancy Fetter   Past Medical History  Diagnosis Date  . Diabetes mellitus   . Asthma   . Anxiety disorder   . Bipolar affective disorder   . Depression   . Migraine    Past Surgical History  Procedure Laterality Date  . Knee arthroscopy    . Tubal ligation     History reviewed. No pertinent family history. History  Substance Use Topics  . Smoking status: Current Every Day Smoker  . Smokeless tobacco: Never Used  . Alcohol Use: Yes     Comment: occasionally   OB History    No data available     Review of Systems  Constitutional: Negative for fever and chills.  Eyes:  Negative for visual disturbance.  Respiratory: Negative for chest tightness and shortness of breath.   Cardiovascular: Negative for chest pain.  Gastrointestinal: Negative for nausea, vomiting and abdominal pain.  Musculoskeletal: Negative for back pain and neck pain.  Neurological: Negative for dizziness, weakness and headaches.  Psychiatric/Behavioral: Negative for suicidal ideas, hallucinations, dysphoric mood and decreased concentration. The patient is not nervous/anxious.       Allergies  Tramadol and Nicotine  Home Medications   Prior to Admission medications   Medication Sig Start Date End Date Taking? Authorizing Provider  albuterol (PROVENTIL HFA;VENTOLIN HFA) 108 (90 BASE) MCG/ACT inhaler Inhale 2 puffs into the lungs every 6 (six) hours as needed for wheezing. 05/21/12   Hadassah Pais, MD  albuterol (PROVENTIL HFA;VENTOLIN HFA) 108 (90 BASE) MCG/ACT inhaler Inhale 1-2 puffs into the lungs every 6 (six) hours as needed for wheezing or shortness of breath. 05/12/14   Kristen N Ward, DO  amlexanox (APHTHASOL) 5 % paste Use as directed in the mouth or throat 4 (four) times daily. 06/06/12   Pedro Earls, MD  cephALEXin (KEFLEX) 500 MG capsule Take 1 capsule (500 mg total) by mouth 2 (two) times daily. 05/13/14   Carneshia Raker, PA-C  clonazePAM (KLONOPIN) 0.5 MG tablet Take 0.5 mg by mouth daily as needed for anxiety.    Historical Provider, MD  clonazePAM (KLONOPIN) 2 MG tablet Take 2 mg by mouth 2 (two) times daily as needed for anxiety.    Historical Provider, MD  divalproex (DEPAKOTE ER) 500 MG 24 hr tablet Take 2 tablets (1,000 mg total) by mouth at  bedtime. 05/12/14   Kristen N Ward, DO  divalproex (DEPAKOTE ER) 500 MG 24 hr tablet Take 1,000 mg by mouth at bedtime.    Historical Provider, MD  gabapentin (NEURONTIN) 300 MG capsule TAKE 1 CAPSULE BY MOUTH DAILY 08/27/12   Pedro Earls, MD  glipiZIDE (GLUCOTROL) 10 MG tablet Take 1 tablet (10 mg total) by mouth 2 (two) times daily  before a meal. 05/13/14   Kairo Laubacher, PA-C  glucose blood (ONE TOUCH ULTRA TEST) test strip Use as instructed 08/28/12   Rosana Hoes, MD  glucose monitoring kit (FREESTYLE) monitoring kit 1 each by Does not apply route as needed for other. 1 unit- 08/28/12   Rosana Hoes, MD  ibuprofen (ADVIL,MOTRIN) 800 MG tablet Take 800 mg by mouth every 8 (eight) hours as needed for moderate pain.    Historical Provider, MD  lisinopril (PRINIVIL,ZESTRIL) 10 MG tablet Take 1 tablet (10 mg total) by mouth daily. 05/13/14   Sereen Schaff, PA-C  metFORMIN (GLUCOPHAGE) 1000 MG tablet Take 1 tablet (1,000 mg total) by mouth 2 (two) times daily with a meal. 05/13/14   Demontray Franta, PA-C  oxyCODONE-acetaminophen (PERCOCET/ROXICET) 5-325 MG per tablet Take 1 tablet by mouth every 4 (four) hours as needed for severe pain.    Historical Provider, MD  pravastatin (PRAVACHOL) 20 MG tablet Take 1 tablet (20 mg total) by mouth at bedtime. 05/12/14   Kristen N Ward, DO  pravastatin (PRAVACHOL) 20 MG tablet Take 20 mg by mouth daily.    Historical Provider, MD  promethazine (PHENERGAN) 25 MG tablet Take 1 tablet (25 mg total) by mouth every 6 (six) hours as needed for nausea. 06/07/12   Garald Balding, NP  risperiDONE (RISPERDAL) 1 MG tablet Take 1 mg by mouth daily.    Historical Provider, MD  ziprasidone (GEODON) 60 MG capsule Take 1 capsule (60 mg total) by mouth 2 (two) times daily with a meal. 05/12/14   Kristen N Ward, DO  ziprasidone (GEODON) 60 MG capsule Take 60 mg by mouth 2 (two) times daily with a meal.    Historical Provider, MD   BP 162/94 mmHg  Pulse 119  Temp(Src) 97.6 F (36.4 C) (Oral)  Resp 18  SpO2 100%  LMP 02/26/2014 (Approximate) Physical Exam  Constitutional: She is oriented to person, place, and time. She appears well-developed and well-nourished. No distress.  HENT:  Head: Normocephalic and atraumatic.  Mouth/Throat: Oropharynx is clear and moist. No oropharyngeal exudate.  Eyes: Conjunctivae  and EOM are normal. Pupils are equal, round, and reactive to light. Right eye exhibits no discharge. Left eye exhibits no discharge.  Neck: Normal range of motion. Neck supple. No tracheal deviation present.  Cardiovascular: Regular rhythm and normal heart sounds.  Tachycardia present.  Exam reveals no friction rub.   No murmur heard. Pulses:      Radial pulses are 2+ on the right side, and 2+ on the left side.       Dorsalis pedis pulses are 2+ on the right side, and 2+ on the left side.  Pulmonary/Chest: Effort normal and breath sounds normal. No respiratory distress. She has no wheezes. She has no rales. She exhibits no tenderness.  Musculoskeletal: Normal range of motion.  Full ROM to upper and lower extremities without difficulty noted, negative ataxia noted.  Lymphadenopathy:    She has no cervical adenopathy.  Neurological: She is alert and oriented to person, place, and time. No cranial nerve deficit. She exhibits normal muscle tone. Coordination normal.  Cranial nerves III-XII grossly intact Strength 5+/5+ to upper and lower extremities bilaterally with resistance applied, equal distribution noted Equal grip strength bilaterally  Negative facial drooping Negative slurred speech  Negative aphasia Patient follows commands well  Patient responds to questions appropriately  Negative arm drift Fine motor skills intact Gait proper, proper balance - negative sway, negative drift, negative step-offs  Skin: Skin is warm and dry. She is not diaphoretic.  Psychiatric:  Pressured speech  Interactive  Anxious   Nursing note and vitals reviewed.   ED Course  Procedures (including critical care time)   Labs Review Labs Reviewed  CBG MONITORING, ED - Abnormal; Notable for the following:    Glucose-Capillary 265 (*)    All other components within normal limits  CBC WITH DIFFERENTIAL  COMPREHENSIVE METABOLIC PANEL  URINALYSIS, ROUTINE W REFLEX MICROSCOPIC  URINE RAPID DRUG SCREEN  (HOSP PERFORMED)  ACETAMINOPHEN LEVEL  SALICYLATE LEVEL  VALPROIC ACID LEVEL  TROPONIN I  POC URINE PREG, ED    Imaging Review No results found.   EKG Interpretation None       1:39 PM Patient does not want to stay. Stated that she needs to go home, eat, take her medications. Stated that she has to pay bills. Patient refusing any blood work and refusing IV. This provider had a long discussion with the patient.   MDM   Final diagnoses:  Medication refill  Poor compliance    Medications  sodium chloride 0.9 % bolus 1,000 mL (not administered)  sodium chloride 0.9 % bolus 1,000 mL (not administered)   Filed Vitals:   05/13/14 1156 05/13/14 1207  BP: 162/94   Pulse: 133 119  Temp: 97.6 F (36.4 C)   TempSrc: Oral   Resp: 18   SpO2: 100%    This provider reviewed the patient's chart. Patient was seen and assessed in ED setting yesterday regarding medication refill and hyperglycemia. Based on labs patient found to have a UTI with positive nitrates. Urine pregnancy negative. UDS unremarkable. Valproic acid negative elevation-doubt toxicity. CBC unremarkable. CMP noted glucose of 386 and an anion gap of 17.0 mg/L. Patient was discharged on diabetic medications, blood pressure medications, and antibiotics. Patient presenting to the ED with request for medication refills-patient reported that she has lost her prescriptions that she received last night, 05/12/2014. Patient reported that she would like them refilled. Upon arrival to the ED patient was tachycardic with a heart rate of 133 bpm-patient reported that she is anxious. Discussed with patient labs will need to be drawn to rule out possible acidosis there is resulting in tachycardia as well as heart issues. Patient refused EKG, labs, urine. Stated that she is would like to go home. Patient only agreed on a CBG-CBG 265. Discussed with patient concern regarding possible acidosis secondary to poor control of diabetes-patient  understood and continues to refuse. Discussed with patient consequences, fatality-patient understood and continues to refuse further workup and to stay. Patient able to make own decisions. Patient does not appear a harm to herself or others. Denied HI/SI. Discussed case in great detail with attending. Patient given medications for diabetes, blood pressure. Referred patient to PCP. Patient signed out AMA.  2:01 PM Patient walked out of the ED without her medications/prescriptions - patient refused to wait for prescriptions.   Jamse Mead, PA-C 05/13/14 1530  Orpah Greek, MD 05/14/14 [phone]

## 2014-05-13 NOTE — ED Notes (Signed)
Pt was just seen here last night to get refills on her regular medications, pt was given multiple prescriptions but reports loosing them since last night. No other complaints.

## 2014-05-15 LAB — URINE CULTURE: Colony Count: >=100000

## 2014-05-17 ENCOUNTER — Telehealth (HOSPITAL_COMMUNITY): Payer: Self-pay

## 2014-05-17 NOTE — Telephone Encounter (Signed)
Post ED Visit - Positive Culture Follow-up  Culture report reviewed by antimicrobial stewardship pharmacist: []  Wes Dulaney, Pharm.D., BCPS []  Celedonio Miyamoto, Pharm.D., BCPS [x]  Georgina Pillion, Pharm.D., BCPS []  [place], [place].D., BCPS, AAHIVP []  Estella Husk, Pharm.D., BCPS, AAHIVP []  Babs Bertin, [address].D.   Positive Urine culture, >/= 100,000 colonies -> E Coli Treated with Cephalexin, organism sensitive to the same and no further patient follow-up is required at this time.  Arvid Right 05/17/2014, 5:24 AM

## 2015-08-20 ENCOUNTER — Emergency Department (HOSPITAL_COMMUNITY)
Admission: EM | Admit: 2015-08-20 | Discharge: 2015-08-20 | Disposition: A | Discharge status: Home / Self Care | Payer: Medicaid Other | Attending: Emergency Medicine | Admitting: Emergency Medicine

## 2015-08-20 ENCOUNTER — Encounter (HOSPITAL_COMMUNITY): Payer: Self-pay | Admitting: Emergency Medicine

## 2015-08-20 DIAGNOSIS — Z7984 Long term (current) use of oral hypoglycemic drugs: Secondary | ICD-10-CM | POA: Diagnosis not present

## 2015-08-20 DIAGNOSIS — F419 Anxiety disorder, unspecified: Secondary | ICD-10-CM | POA: Diagnosis present

## 2015-08-20 DIAGNOSIS — G43909 Migraine, unspecified, not intractable, without status migrainosus: Secondary | ICD-10-CM | POA: Insufficient documentation

## 2015-08-20 DIAGNOSIS — F17200 Nicotine dependence, unspecified, uncomplicated: Secondary | ICD-10-CM | POA: Insufficient documentation

## 2015-08-20 DIAGNOSIS — E119 Type 2 diabetes mellitus without complications: Secondary | ICD-10-CM | POA: Insufficient documentation

## 2015-08-20 DIAGNOSIS — Z792 Long term (current) use of antibiotics: Secondary | ICD-10-CM | POA: Insufficient documentation

## 2015-08-20 DIAGNOSIS — Z79899 Other long term (current) drug therapy: Secondary | ICD-10-CM | POA: Diagnosis not present

## 2015-08-20 DIAGNOSIS — F3130 Bipolar disorder, current episode depressed, mild or moderate severity, unspecified: Secondary | ICD-10-CM | POA: Insufficient documentation

## 2015-08-20 DIAGNOSIS — F329 Major depressive disorder, single episode, unspecified: Secondary | ICD-10-CM

## 2015-08-20 DIAGNOSIS — J45909 Unspecified asthma, uncomplicated: Secondary | ICD-10-CM | POA: Diagnosis not present

## 2015-08-20 NOTE — ED Notes (Signed)
Per pt, states she is overwhelmed, having anxiety and depression

## 2015-08-20 NOTE — BH Assessment (Signed)
TC from [facility] requesting outpatient resources for pt regarding pt's anxiety and depression. Writer gave written outpatient resources and [employer] took resources to pt.  Evette Cristal, [place] Therapeutic Triage Specialist

## 2015-08-20 NOTE — ED Provider Notes (Signed)
CSN: [id number]     Arrival date & time 08/20/15  1022 History   First MD Initiated Contact with Patient 08/20/15 1148     Chief Complaint  Patient presents with  . depression/anxiety      (Consider location/radiation/quality/duration/timing/severity/associated sxs/prior Treatment) HPI Comments: 45 year old female with a history of, type 2 diabetes, migraines, asthma, anxiety and depression who presents with anxiety and depression. The patient endorses a long-standing history of anxiety and depression. She has taken Klonopin in the past which she states has helped. I asked her about any other long-term medications and she denied, however review of her chart shows risperdal and Geodon as well as Depakote. She states that she has anxiety related to her youngest child and sometimes has panic attacks. She denies any SI, HI, or hallucinations. She has a primary care provider but denies following with a psychiatrist or counselor. She denies any other complaints.  The history is provided by the patient.    Past Medical History  Diagnosis Date  . Diabetes mellitus   . Asthma   . Anxiety disorder   . Bipolar affective disorder ([place])   . Depression   . Migraine    Past Surgical History  Procedure Laterality Date  . Knee arthroscopy    . Tubal ligation     No family history on file. Social History  Substance Use Topics  . Smoking status: Current Every Day Smoker  . Smokeless tobacco: Never Used  . Alcohol Use: Yes     Comment: occasionally   OB History    No data available     Review of Systems  10 Systems reviewed and are negative for acute change except as noted in the HPI.   Allergies  Tramadol and Nicotine  Home Medications   Prior to Admission medications   Medication Sig Start Date End Date Taking? Authorizing Provider  albuterol (PROVENTIL HFA;VENTOLIN HFA) 108 (90 BASE) MCG/ACT inhaler Inhale 2 puffs into the lungs every 6 (six) hours as needed for wheezing. 05/21/12    Hadassah Pais, MD  albuterol (PROVENTIL HFA;VENTOLIN HFA) 108 (90 BASE) MCG/ACT inhaler Inhale 1-2 puffs into the lungs every 6 (six) hours as needed for wheezing or shortness of breath. 05/12/14   Kristen N Ward, DO  amlexanox (APHTHASOL) 5 % paste Use as directed in the mouth or throat 4 (four) times daily. 06/06/12   Pedro Earls, MD  cephALEXin (KEFLEX) 500 MG capsule Take 1 capsule (500 mg total) by mouth 2 (two) times daily. 05/13/14   Marissa Sciacca, PA-C  clonazePAM (KLONOPIN) 0.5 MG tablet Take 0.5 mg by mouth daily as needed for anxiety.    Historical Provider, MD  clonazePAM (KLONOPIN) 2 MG tablet Take 2 mg by mouth 2 (two) times daily as needed for anxiety.    Historical Provider, MD  divalproex (DEPAKOTE ER) 500 MG 24 hr tablet Take 2 tablets (1,000 mg total) by mouth at bedtime. 05/12/14   Kristen N Ward, DO  divalproex (DEPAKOTE ER) 500 MG 24 hr tablet Take 1,000 mg by mouth at bedtime.    Historical Provider, MD  gabapentin (NEURONTIN) 300 MG capsule TAKE 1 CAPSULE BY MOUTH DAILY 08/27/12   Pedro Earls, MD  glipiZIDE (GLUCOTROL) 10 MG tablet Take 1 tablet (10 mg total) by mouth 2 (two) times daily before a meal. 05/13/14   Marissa Sciacca, PA-C  glucose blood (ONE TOUCH ULTRA TEST) test strip Use as instructed 08/28/12   Rosana Hoes, MD  glucose monitoring kit (FREESTYLE) monitoring  kit 1 each by Does not apply route as needed for other. 1 unit- 08/28/12   Rosana Hoes, MD  ibuprofen (ADVIL,MOTRIN) 800 MG tablet Take 800 mg by mouth every 8 (eight) hours as needed for moderate pain.    Historical Provider, MD  lisinopril (PRINIVIL,ZESTRIL) 10 MG tablet Take 1 tablet (10 mg total) by mouth daily. 05/13/14   Marissa Sciacca, PA-C  metFORMIN (GLUCOPHAGE) 1000 MG tablet Take 1 tablet (1,000 mg total) by mouth 2 (two) times daily with a meal. 05/13/14   Marissa Sciacca, PA-C  oxyCODONE-acetaminophen (PERCOCET/ROXICET) 5-325 MG per tablet Take 1 tablet by mouth every 4 (four) hours as needed for  severe pain.    Historical Provider, MD  pravastatin (PRAVACHOL) 20 MG tablet Take 1 tablet (20 mg total) by mouth at bedtime. 05/12/14   Kristen N Ward, DO  pravastatin (PRAVACHOL) 20 MG tablet Take 20 mg by mouth daily.    Historical Provider, MD  promethazine (PHENERGAN) 25 MG tablet Take 1 tablet (25 mg total) by mouth every 6 (six) hours as needed for nausea. 06/07/12   Junius Creamer, NP  risperiDONE (RISPERDAL) 1 MG tablet Take 1 mg by mouth daily.    Historical Provider, MD  ziprasidone (GEODON) 60 MG capsule Take 1 capsule (60 mg total) by mouth 2 (two) times daily with a meal. 05/12/14   Kristen N Ward, DO  ziprasidone (GEODON) 60 MG capsule Take 60 mg by mouth 2 (two) times daily with a meal.    Historical Provider, MD   BP 111/76 mmHg  Pulse 97  Temp(Src) 98.4 F (36.9 C) (Oral)  Resp 18  SpO2 98% Physical Exam  Constitutional: She is oriented to person, place, and time. She appears well-developed and well-nourished. No distress.  HENT:  Head: Normocephalic and atraumatic.  Moist mucous membranes  Eyes: Conjunctivae are normal. Pupils are equal, round, and reactive to light.  Neck: Neck supple.  Cardiovascular: Normal rate, regular rhythm and normal heart sounds.   No murmur heard. Pulmonary/Chest: Effort normal and breath sounds normal.  Abdominal: Soft. Bowel sounds are normal. She exhibits no distension. There is no tenderness.  Musculoskeletal: She exhibits no edema.  Neurological: She is alert and oriented to person, place, and time.  Fluent speech, normal gait  Skin: Skin is warm and dry.  Psychiatric:  Depressed mood, bizarre affect, tangential thought process  Nursing note and vitals reviewed.   ED Course  Procedures (including critical care time) Labs Review Labs Reviewed - No data to display   MDM   Final diagnoses:  Anxiety and depression   Patient presents with anxiety and depression and endorses long history of the same. On exam she was well-appearing,  bizarre affect with tangential thought process and difficulty providing straightforward answers. She denies any SI, HI, or hallucinations. I have explained to her that she would not meet inpatient criteria but she does need outpatient evaluation. I instructed to start with her PCP who can recommend psychiatry and counseling. Return precautions reviewed and patient discharged in satisfactory condition.   Sharlett Iles, MD 08/20/15 1225

## 2015-08-24 ENCOUNTER — Emergency Department (HOSPITAL_COMMUNITY)
Admission: EM | Admit: 2015-08-24 | Discharge: 2015-08-24 | Disposition: A | Discharge status: Home / Self Care | Payer: Medicaid Other | Source: Home / Self Care | Attending: Emergency Medicine | Admitting: Emergency Medicine

## 2015-08-24 ENCOUNTER — Encounter (HOSPITAL_COMMUNITY): Payer: Self-pay | Admitting: Emergency Medicine

## 2015-08-24 ENCOUNTER — Encounter (HOSPITAL_COMMUNITY): Payer: Self-pay

## 2015-08-24 ENCOUNTER — Emergency Department (HOSPITAL_COMMUNITY)
Admission: EM | Admit: 2015-08-24 | Discharge: 2015-08-27 | Disposition: A | Discharge status: Other Acute Inpatient Hospital | Payer: Medicaid Other | Attending: Emergency Medicine | Admitting: Emergency Medicine

## 2015-08-24 DIAGNOSIS — F419 Anxiety disorder, unspecified: Secondary | ICD-10-CM

## 2015-08-24 DIAGNOSIS — F319 Bipolar disorder, unspecified: Secondary | ICD-10-CM | POA: Diagnosis not present

## 2015-08-24 DIAGNOSIS — F17200 Nicotine dependence, unspecified, uncomplicated: Secondary | ICD-10-CM | POA: Insufficient documentation

## 2015-08-24 DIAGNOSIS — F1110 Opioid abuse, uncomplicated: Secondary | ICD-10-CM | POA: Diagnosis not present

## 2015-08-24 DIAGNOSIS — E119 Type 2 diabetes mellitus without complications: Secondary | ICD-10-CM | POA: Insufficient documentation

## 2015-08-24 DIAGNOSIS — F1310 Sedative, hypnotic or anxiolytic abuse, uncomplicated: Secondary | ICD-10-CM | POA: Diagnosis not present

## 2015-08-24 DIAGNOSIS — G43909 Migraine, unspecified, not intractable, without status migrainosus: Secondary | ICD-10-CM

## 2015-08-24 DIAGNOSIS — J45909 Unspecified asthma, uncomplicated: Secondary | ICD-10-CM | POA: Insufficient documentation

## 2015-08-24 DIAGNOSIS — F312 Bipolar disorder, current episode manic severe with psychotic features: Secondary | ICD-10-CM | POA: Diagnosis present

## 2015-08-24 DIAGNOSIS — Z7984 Long term (current) use of oral hypoglycemic drugs: Secondary | ICD-10-CM | POA: Insufficient documentation

## 2015-08-24 DIAGNOSIS — Z79899 Other long term (current) drug therapy: Secondary | ICD-10-CM | POA: Insufficient documentation

## 2015-08-24 DIAGNOSIS — F3130 Bipolar disorder, current episode depressed, mild or moderate severity, unspecified: Secondary | ICD-10-CM | POA: Insufficient documentation

## 2015-08-24 DIAGNOSIS — J45901 Unspecified asthma with (acute) exacerbation: Secondary | ICD-10-CM

## 2015-08-24 LAB — BASIC METABOLIC PANEL
Anion gap: 12 (ref 5–15)
BUN: 12 mg/dL (ref 6–20)
CO2: 25 mmol/L (ref 22–32)
CREATININE: 1.1 mg/dL — AB (ref 0.44–1.00)
Calcium: 9.3 mg/dL (ref 8.9–10.3)
Chloride: 88 mmol/L — ABNORMAL LOW (ref 101–111)
GFR calc Af Amer: >60 mL/min (ref >60)
GFR calc non Af Amer: 59 mL/min — ABNORMAL LOW (ref >60)
GLUCOSE: 149 mg/dL — AB (ref 65–99)
Potassium: 3.5 mmol/L (ref 3.5–5.1)
SODIUM: 125 mmol/L — AB (ref 135–145)

## 2015-08-24 LAB — RAPID URINE DRUG SCREEN, HOSP PERFORMED
Amphetamines: NOT DETECTED
Barbiturates: NOT DETECTED
Benzodiazepines: POSITIVE — AB
Cocaine: NOT DETECTED
OPIATES: POSITIVE — AB
TETRAHYDROCANNABINOL: NOT DETECTED

## 2015-08-24 LAB — CBC WITH DIFFERENTIAL/PLATELET
Basophils Absolute: 0.1 10*3/uL (ref 0.0–0.1)
Basophils Relative: 0 %
EOS ABS: 0.3 10*3/uL (ref 0.0–0.7)
EOS PCT: 2 %
HCT: 35.7 % — ABNORMAL LOW (ref 36.0–46.0)
HEMOGLOBIN: 12.9 g/dL (ref 12.0–15.0)
LYMPHS ABS: 4 10*3/uL (ref 0.7–4.0)
LYMPHS PCT: 32 %
MCH: 30.2 pg (ref 26.0–34.0)
MCHC: 36.1 g/dL — ABNORMAL HIGH (ref 30.0–36.0)
MCV: 83.6 fL (ref 78.0–100.0)
MONOS PCT: 6 %
Monocytes Absolute: 0.8 10*3/uL (ref 0.1–1.0)
NEUTROS PCT: 60 %
Neutro Abs: 7.4 10*3/uL (ref 1.7–7.7)
PLATELETS: 271 10*3/uL (ref 150–400)
RBC: 4.27 MIL/uL (ref 3.87–5.11)
RDW: 12.7 % (ref 11.5–15.5)
WBC: 12.5 10*3/uL — AB (ref 4.0–10.5)

## 2015-08-24 LAB — ETHANOL

## 2015-08-24 MED ORDER — HYDROXYZINE HCL 25 MG PO TABS: 25.0000 mg | ORAL_TABLET | Freq: Four times a day (QID) | ORAL | Status: DC | PRN

## 2015-08-24 MED ORDER — ACETAMINOPHEN 325 MG PO TABS
650.0000 mg | ORAL_TABLET | ORAL | Status: DC | PRN
Start: 2015-08-24 — End: 2015-08-27
  Administered 2015-08-25: 650 mg via ORAL
  Filled 2015-08-24 (×2): qty 2

## 2015-08-24 MED ORDER — ALUM & MAG HYDROXIDE-SIMETH 200-200-20 MG/5ML PO SUSP: 30.0000 mL | ORAL | Status: DC | PRN

## 2015-08-24 MED ORDER — HYDROXYZINE HCL 25 MG PO TABS
25.0000 mg | ORAL_TABLET | Freq: Once | ORAL | Status: AC
  Administered 2015-08-24: 25 mg via ORAL
  Filled 2015-08-24: qty 1

## 2015-08-24 MED ORDER — IBUPROFEN 200 MG PO TABS
600.0000 mg | ORAL_TABLET | Freq: Three times a day (TID) | ORAL | Status: DC | PRN
  Administered 2015-08-24 – 2015-08-27 (×2): 600 mg via ORAL
  Filled 2015-08-24 (×2): qty 3

## 2015-08-24 MED ORDER — LORAZEPAM 1 MG PO TABS
1.0000 mg | ORAL_TABLET | Freq: Three times a day (TID) | ORAL | Status: DC | PRN
  Administered 2015-08-25 – 2015-08-27 (×5): 1 mg via ORAL
  Filled 2015-08-24 (×7): qty 1

## 2015-08-24 MED ORDER — ONDANSETRON HCL 4 MG PO TABS
4.0000 mg | ORAL_TABLET | Freq: Three times a day (TID) | ORAL | Status: DC | PRN
  Administered 2015-08-26: 4 mg via ORAL
  Filled 2015-08-24: qty 1
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
## 2015-08-24 NOTE — ED Notes (Signed)
Pt reports that her son set up an appointment from resources that were provided x 4 days ago, but she is unsure when the appointment is scheduled and unsure with whom.  Pt sts "my son has that information."

## 2015-08-24 NOTE — ED Notes (Signed)
Pt just d/c'd a couple of hours ago.  Pt is stating "I need some help.  I used to see [facility] but I didn't like going there.  I need help with my anxiety and my diabetes.  I'm hurting in my bladder too when I pee."  Encouraged pt to speak openly with PA.  Pt verbalized understanding.  Pt stated "I came back because the medicine you all gave me made me tired.  I tried to call my son but I couldn't remember his #."

## 2015-08-24 NOTE — ED Notes (Signed)
Pt c/o anxiety and depression x "a while."  Pt was seen x 4 days ago for same.  Sts "they just told me to get help and gave me resources."  Pt reports taking Xanax and Ambien as prescribed.

## 2015-08-24 NOTE — ED Notes (Signed)
TTS in with pt. 

## 2015-08-24 NOTE — ED Notes (Signed)
Pt stated "I have really bad anxiety.  I haven't seen my doctor in a while.  I think the cold weather has made my anxiety worse."

## 2015-08-24 NOTE — ED Notes (Addendum)
Patient belongings placed in labeled bags in room 32 locker - pants, shirt, black tennis shoes, purple coat, two pairs of glasses, cell phone.

## 2015-08-24 NOTE — ED Notes (Signed)
Patient informed of need for urine specimen.  Cup left in room.  Patient states she will attempt to collect specimen after PO fluids.

## 2015-08-24 NOTE — ED Notes (Signed)
Pt was seen for anxiety earlier and states they gave her some medication   Pt states now she is dizzy and feels like she is going to pass out  Pt states she feels really bad

## 2015-08-24 NOTE — ED Provider Notes (Signed)
CSN: [id number]     Arrival date & time 08/24/15  1547 History  By signing my name below, I, Evelene Croon, attest that this documentation has been prepared under the direction and in the presence of non-physician practitioner, Domenic Moras, PA-C. Electronically Signed: Evelene Croon, Scribe. 08/24/2015. 5:50 PM.      Chief Complaint  Patient presents with  . Anxiety    The history is provided by the patient and medical records. No language interpreter was used.   HPI Comments:  [name] [name] is a 45 y.o. female with a history of anxiety and depression, who presents to the Emergency Department complaining of anxiety and depression x 1.5 months. She describes racing thought and butterflies in her stomach. She is unsure of the trigger for this episode, states it may have been caused by the weather but no other significant life events. She notes she experiences these episodes every few years. Pt is complaint with her meds; no recent change in dose. She denies SI/HI, auditory/visual hallucinations. No alleviating factors noted. Pt was seen in the ED for the same on 08/20/15. She was discharged with follow up resources but has not yet followed up.  Pt has no physical complaints or symptoms at this time. Her LNMP beginning oh Feb 2017.  Past Medical History  Diagnosis Date  . Diabetes mellitus   . Asthma   . Anxiety disorder   . Bipolar affective disorder ([place])   . Depression   . Migraine    Past Surgical History  Procedure Laterality Date  . Knee arthroscopy    . Tubal ligation     History reviewed. No pertinent family history. Social History  Substance Use Topics  . Smoking status: Current Every Day Smoker  . Smokeless tobacco: Never Used  . Alcohol Use: Yes     Comment: occasionally   OB History    No data available     Review of Systems  Constitutional: Negative for fever and chills.  Respiratory: Negative for shortness of breath.   Cardiovascular: Negative for chest pain.   Neurological: Negative for dizziness and headaches.  Psychiatric/Behavioral: The patient is nervous/anxious.        + Depression    Allergies  Tramadol and Nicotine  Home Medications   Prior to Admission medications   Medication Sig Start Date End Date Taking? Authorizing Provider  albuterol (PROVENTIL HFA;VENTOLIN HFA) 108 (90 BASE) MCG/ACT inhaler Inhale 2 puffs into the lungs every 6 (six) hours as needed for wheezing. 05/21/12  Yes Hadassah Pais, MD  ALPRAZolam Duanne Moron) 1 MG tablet Take 1 mg by mouth every 8 (eight) hours as needed for anxiety.  08/07/15  Yes Historical Provider, MD  atenolol (TENORMIN) 25 MG tablet Take 25 mg by mouth daily. 07/05/15  Yes Historical Provider, MD  gabapentin (NEURONTIN) 300 MG capsule TAKE 1 CAPSULE BY MOUTH DAILY Patient taking differently: TAKE 300 MG BY MOUTH THREE TIMES DAILY 08/27/12  Yes Pedro Earls, MD  glucose blood (ONE TOUCH ULTRA TEST) test strip Use as instructed 08/28/12  Yes Rosana Hoes, MD  glucose monitoring kit (FREESTYLE) monitoring kit 1 each by Does not apply route as needed for other. 1 unit- 08/28/12  Yes Rosana Hoes, MD  HYDROcodone-acetaminophen ([place]) 10-325 MG tablet Take 1 tablet by mouth 2 (two) times daily as needed for moderate pain or severe pain.  08/07/15  Yes Historical Provider, MD  lisinopril-hydrochlorothiazide (PRINZIDE,ZESTORETIC) 10-12.5 MG tablet Take 1 tablet by mouth daily. 07/05/15  Yes Historical Provider,  MD  metFORMIN (GLUCOPHAGE) 850 MG tablet Take 850 mg by mouth 2 (two) times daily. 08/06/15  Yes Historical Provider, MD  promethazine (PHENERGAN) 25 MG tablet Take 1 tablet (25 mg total) by mouth every 6 (six) hours as needed for nausea. 06/07/12  Yes Junius Creamer, NP  traZODone (DESYREL) 50 MG tablet Take 50 mg by mouth at bedtime. 08/06/15  Yes Historical Provider, MD  zolpidem (AMBIEN) 10 MG tablet Take 10 mg by mouth at bedtime as needed for sleep.  08/07/15  Yes Historical Provider, MD  hydrOXYzine  (ATARAX/VISTARIL) 25 MG tablet Take 1 tablet (25 mg total) by mouth every 6 (six) hours as needed for anxiety. 08/24/15   Domenic Moras, PA-C   BP 121/77 mmHg  Pulse 88  Temp(Src) 98.7 F (37.1 C) (Oral)  Resp 18  SpO2 98% Physical Exam  Constitutional: She is oriented to person, place, and time. She appears well-developed and well-nourished. No distress.  HENT:  Head: Normocephalic and atraumatic.  Eyes: Conjunctivae are normal. Pupils are equal, round, and reactive to light.  Cardiovascular: Normal rate and normal heart sounds.   Pulmonary/Chest: Effort normal. No respiratory distress. She has wheezes. She has no rhonchi. She has no rales.  Faint expiratory wheeze.   Abdominal: She exhibits no distension.  Neurological: She is alert and oriented to person, place, and time.  Skin: Skin is warm and dry.  Psychiatric: Her speech is normal. Judgment normal. She is slowed and withdrawn. Thought content is not paranoid. Cognition and memory are normal. She exhibits a depressed mood. She expresses no homicidal and no suicidal ideation.  Nursing note and vitals reviewed.   ED Course  Procedures   DIAGNOSTIC STUDIES:  Oxygen Saturation is 98% on RA, normal by my interpretation.    COORDINATION OF CARE:  5:48 PM Will administer vistaril in ED.  Discussed treatment plan with pt at bedside and pt agreed to plan.    MDM   BP 121/77 mmHg  Pulse 88  Temp(Src) 98.7 F (37.1 C) (Oral)  Resp 18  SpO2 98%   Patient presents to the emergency department complaining of symptoms consistent with anxiety and depression.  Patient has a history of same with similar episodes.  The patient is resting comfortably, in no apparent distress and asymptomatic.  Labs, ECG and vital signs reviewed from previous visits.   Stress reducing mechanisms discussed including caffeine intake.  Patient has been referred to psychiatric services for follow-up.  Vistaril provide for sxs support.     Final diagnoses:   Anxiety    I personally performed the services described in this documentation, which was scribed in my presence. The recorded information has been reviewed and is accurate.      Domenic Moras, PA-C 08/24/15 [place], PA-C 08/24/15 1811  Daleen Bo, MD 08/25/15 [phone]

## 2015-08-24 NOTE — Discharge Instructions (Signed)
Generalized Anxiety Disorder Generalized anxiety disorder (GAD) is a mental disorder. It interferes with life functions, including relationships, work, and school. GAD is different from normal anxiety, which everyone experiences at some point in their lives in response to specific life events and activities. Normal anxiety actually helps us prepare for and get through these life events and activities. Normal anxiety goes away after the event or activity is over.  GAD causes anxiety that is not necessarily related to specific events or activities. It also causes excess anxiety in proportion to specific events or activities. The anxiety associated with GAD is also difficult to control. GAD can vary from mild to severe. People with severe GAD can have intense waves of anxiety with physical symptoms (panic attacks).  SYMPTOMS The anxiety and worry associated with GAD are difficult to control. This anxiety and worry are related to many life events and activities and also occur more days than not for 6 months or longer. People with GAD also have three or more of the following symptoms (one or more in children):  Restlessness.   Fatigue.  Difficulty concentrating.   Irritability.  Muscle tension.  Difficulty sleeping or unsatisfying sleep. DIAGNOSIS GAD is diagnosed through an assessment by your health care provider. Your health care provider will ask you questions aboutyour mood,physical symptoms, and events in your life. Your health care provider may ask you about your medical history and use of alcohol or drugs, including prescription medicines. Your health care provider may also do a physical exam and blood tests. Certain medical conditions and the use of certain substances can cause symptoms similar to those associated with GAD. Your health care provider may refer you to a mental health specialist for further evaluation. TREATMENT The following therapies are usually used to treat GAD:    Medication. Antidepressant medication usually is prescribed for long-term daily control. Antianxiety medicines may be added in severe cases, especially when panic attacks occur.   Talk therapy (psychotherapy). Certain types of talk therapy can be helpful in treating GAD by providing support, education, and guidance. A form of talk therapy called cognitive behavioral therapy can teach you healthy ways to think about and react to daily life events and activities.  Stress managementtechniques. These include yoga, meditation, and exercise and can be very helpful when they are practiced regularly. A mental health specialist can help determine which treatment is best for you. Some people see improvement with one therapy. However, other people require a combination of therapies.   This information is not intended to replace advice given to you by your health care provider. Make sure you discuss any questions you have with your health care provider.   Document Released: 09/21/2012 Document Revised: 06/17/2014 Document Reviewed: 09/21/2012 Elsevier Interactive Patient Education 2016 Elsevier Inc.  

## 2015-08-25 DIAGNOSIS — F312 Bipolar disorder, current episode manic severe with psychotic features: Secondary | ICD-10-CM | POA: Diagnosis not present

## 2015-08-25 LAB — BASIC METABOLIC PANEL
ANION GAP: 9 (ref 5–15)
ANION GAP: 9 (ref 5–15)
BUN: 11 mg/dL (ref 6–20)
BUN: 13 mg/dL (ref 6–20)
CALCIUM: 9 mg/dL (ref 8.9–10.3)
CHLORIDE: 91 mmol/L — AB (ref 101–111)
CO2: 26 mmol/L (ref 22–32)
CO2: 25 mmol/L (ref 22–32)
CREATININE: 1.18 mg/dL — AB (ref 0.44–1.00)
Calcium: 8.8 mg/dL — ABNORMAL LOW (ref 8.9–10.3)
Chloride: 95 mmol/L — ABNORMAL LOW (ref 101–111)
Creatinine, Ser: 1.04 mg/dL — ABNORMAL HIGH (ref 0.44–1.00)
GFR, EST NON AFRICAN AMERICAN: 54 mL/min — AB (ref >60)
GLUCOSE: 158 mg/dL — AB (ref 65–99)
GLUCOSE: 124 mg/dL — AB (ref 65–99)
POTASSIUM: 3.8 mmol/L (ref 3.5–5.1)
Potassium: 4.3 mmol/L (ref 3.5–5.1)
Sodium: 126 mmol/L — ABNORMAL LOW (ref 135–145)
Sodium: 129 mmol/L — ABNORMAL LOW (ref 135–145)

## 2015-08-25 LAB — CBG MONITORING, ED: GLUCOSE-CAPILLARY: 161 mg/dL — AB (ref 65–99)

## 2015-08-25 MED ORDER — METFORMIN HCL 850 MG PO TABS
850.0000 mg | ORAL_TABLET | Freq: Two times a day (BID) | ORAL | Status: DC
  Administered 2015-08-25 – 2015-08-27 (×4): 850 mg via ORAL
  Filled 2015-08-25 (×6): qty 1

## 2015-08-25 MED ORDER — SODIUM CHLORIDE 0.9 % IV BOLUS (SEPSIS)
1000.0000 mL | Freq: Once | INTRAVENOUS | Status: AC
Start: 2015-08-25 — End: 2015-08-25
  Administered 2015-08-25: 1000 mL via INTRAVENOUS

## 2015-08-25 MED ORDER — SODIUM CHLORIDE 0.9 % IV SOLN
INTRAVENOUS | Status: DC
  Administered 2015-08-25: 22:00:00 via INTRAVENOUS

## 2015-08-25 MED ORDER — BUSPIRONE HCL 10 MG PO TABS
5.0000 mg | ORAL_TABLET | Freq: Two times a day (BID) | ORAL | Status: DC
  Administered 2015-08-25 – 2015-08-27 (×5): 5 mg via ORAL
  Filled 2015-08-25 (×5): qty 1

## 2015-08-25 MED ORDER — LISINOPRIL 10 MG PO TABS
10.0000 mg | ORAL_TABLET | Freq: Every day | ORAL | Status: DC
  Administered 2015-08-25 – 2015-08-27 (×3): 10 mg via ORAL
  Filled 2015-08-25 (×3): qty 1

## 2015-08-25 MED ORDER — LISINOPRIL-HYDROCHLOROTHIAZIDE 10-12.5 MG PO TABS: 1.0000 | ORAL_TABLET | Freq: Every day | ORAL | Status: DC

## 2015-08-25 MED ORDER — HYDROCHLOROTHIAZIDE 12.5 MG PO CAPS
12.5000 mg | ORAL_CAPSULE | Freq: Every day | ORAL | Status: DC
  Administered 2015-08-25 – 2015-08-27 (×3): 12.5 mg via ORAL
  Filled 2015-08-25 (×3): qty 1

## 2015-08-25 NOTE — BH Assessment (Signed)
BHH Assessment Progress Note  Per Thedore Mins, MD, this pt requires psychiatric hospitalization at this time.  Baird Kay, RN, [facility] has assigned pt to [facility] Rm 403-1; she is not to be transported until after 20:00.  Pt has signed Voluntary Admission and Consent for Treatment, as well as Consent to Release Information to her son, and signed forms have been faxed to [facility].  Pt's nurse has been notified, and agrees to send original paperwork along with pt via [place], and to call report to [phone].  Please note that pt prefers to be addressed as "[name] [name]," her middle name.  Doylene Canning, MA Triage Specialist [phone]

## 2015-08-25 NOTE — ED Notes (Signed)
Per report pt is here for anxiety and denies SI and HI,  She is here voluntarily

## 2015-08-25 NOTE — ED Notes (Signed)
Dr. Deretha Emory made aware of continued low sodium, states he will re-evaluate patient

## 2015-08-25 NOTE — ED Notes (Signed)
Pt reports feeling very anxious and having concerns about her medications.  Pt was informed that the MD would re-start her appropriate home medications. Pt expressed understanding.

## 2015-08-25 NOTE — ED Notes (Signed)
FIRST ATTEMPT TO CALL REPORT FOR 403-1. FACILITY ASKED FOR ME TO CALL BACK AT 7:30PM, DUE TO THEM BEING IN SHIFT CHANGE.

## 2015-08-25 NOTE — ED Notes (Signed)
Addressed abnormal sodium level with D

## 2015-08-25 NOTE — ED Provider Notes (Signed)
The patient is only had about 500 mL of normal saline at this time. So sodium may not improve much. But lab has been.  MAINTENANCE rate of normal saline  Vanetta Mulders, MD 08/25/15 2248
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
## 2015-08-26 LAB — BASIC METABOLIC PANEL
Anion gap: 6 (ref 5–15)
BUN: 12 mg/dL (ref 6–20)
CALCIUM: 8.4 mg/dL — AB (ref 8.9–10.3)
CO2: 23 mmol/L (ref 22–32)
CREATININE: 0.94 mg/dL (ref 0.44–1.00)
Chloride: 99 mmol/L — ABNORMAL LOW (ref 101–111)
Glucose, Bld: 165 mg/dL — ABNORMAL HIGH (ref 65–99)
Potassium: 3.9 mmol/L (ref 3.5–5.1)
SODIUM: 128 mmol/L — AB (ref 135–145)

## 2015-08-26 LAB — CBG MONITORING, ED: GLUCOSE-CAPILLARY: 153 mg/dL — AB (ref 65–99)

## 2015-08-26 MED ORDER — ZOLPIDEM TARTRATE 10 MG PO TABS
10.0000 mg | ORAL_TABLET | Freq: Every evening | ORAL | Status: DC | PRN
  Administered 2015-08-27: 10 mg via ORAL
  Filled 2015-08-26: qty 1

## 2015-08-26 MED ORDER — TRAZODONE HCL 50 MG PO TABS
50.0000 mg | ORAL_TABLET | Freq: Every day | ORAL | Status: DC
  Administered 2015-08-26: 50 mg via ORAL
  Filled 2015-08-26: qty 1

## 2015-08-26 NOTE — ED Notes (Signed)
Pt is awake and alert, resting comfortably in bed with no signs of acute distress noted.

## 2015-08-26 NOTE — BH Assessment (Signed)
Patient was reassessed by TTS.   Patient was in her room and answered all questions sitting down. Patient is alert and oriented. Patient denies SI/HI at this time.  Patient denies AVH. Patient states that she came into teh ED "to get my meds adjusted for anxiety." Patient states that she did not sleep well due to being anxious. Patient appears flat and her mood and affect are congruent. Patient denies having any questions or concerns.    Consulted with Dr. Jannifer Franklin who continues to recommend inpatient treatment at this time.   Davina Poke, LCSW Therapeutic Triage Specialist [facility] 08/26/2015 11:41 AM

## 2015-08-26 NOTE — ED Notes (Signed)
Pt complaining of anxiety.  PRN Ativan given.  Patient states, "Can you get rid of the lady next door?  She keeps slamming doors.  She keeps staying in the bathroom.  Something is wrong with her.  She's annoying me."  Redirection and reorientation given to patient.  Patient receptive.

## 2015-08-26 NOTE — ED Notes (Signed)
Pt ambulating to nurses station with steady gait, requesting that the physician start her on a medication for anxiety that she saw on a commercial on TV. Discussed with pt that she would have to talk to the psychiatrist about medications, will update her on her next available PRN meds.

## 2015-08-26 NOTE — Progress Notes (Signed)
Disposition CSW completed patient referrals to the following inpatient psych facilities:  [facility]  CSW will continue to follow patient for placement needs.  [facility] Disposition CSW [phone]

## 2015-08-26 NOTE — Progress Notes (Addendum)
Pt admits she has been feeling overwhelmed and depressed. Pt did request cracker and cheese stating ,"I have not eaten in a few days." Pts affect is flat,blunted and depressed. She does contract for safety. Pt presently has NSS infusing  via her left hand a 22g at 50cc/hr. Pt is very tearful and asked the writer if she should get life insurance. Pt stated , "I don't think I am going to die but I just do not know." pt stated she has four children and only one son who is 17 lives with her. Pt stated her children's father is in jail for statutory rate of a 46 year old. Pt speaks very slowly and intermittently cries. Pt took a shower (10am ) IV heplock removed. Pt ate breakfast and lunch. At 1:20pm Pt given 1mg  of ativan for "nerves." 3;15pm Report to [place].

## 2015-08-26 NOTE — ED Notes (Signed)
Pt is awake and alert, resting and watching tv in her room.  No acute distress noted and no needs expressed at this time.

## 2015-08-26 NOTE — ED Notes (Signed)
Pt out to nurse's station requesting to speak with this RN. Went into room, pt requested the door to be shut and the lights turned on. She says she has not been truthful to the staff, reports that she has bipolar and didn't know if we already knew and that she has been using "pills, other peoples pills". Pt says she is very anxious "but does not want to go outside" Reassured pt she is safe and reinforced plan of care at this time.

## 2015-08-27 ENCOUNTER — Encounter (HOSPITAL_COMMUNITY): Payer: Self-pay | Admitting: *Deleted

## 2015-08-27 ENCOUNTER — Inpatient Hospital Stay (HOSPITAL_COMMUNITY)
Admission: AD | Admit: 2015-08-27 | Discharge: 2015-08-28 | DRG: 885 | Disposition: A | Discharge status: Other Acute Inpatient Hospital | Payer: Medicaid Other | Source: Intra-hospital | Attending: Psychiatry | Admitting: Psychiatry

## 2015-08-27 DIAGNOSIS — E119 Type 2 diabetes mellitus without complications: Secondary | ICD-10-CM | POA: Diagnosis present

## 2015-08-27 DIAGNOSIS — F17200 Nicotine dependence, unspecified, uncomplicated: Secondary | ICD-10-CM | POA: Diagnosis present

## 2015-08-27 DIAGNOSIS — Z91410 Personal history of adult physical and sexual abuse: Secondary | ICD-10-CM

## 2015-08-27 DIAGNOSIS — F3130 Bipolar disorder, current episode depressed, mild or moderate severity, unspecified: Secondary | ICD-10-CM | POA: Diagnosis not present

## 2015-08-27 DIAGNOSIS — J45909 Unspecified asthma, uncomplicated: Secondary | ICD-10-CM | POA: Diagnosis present

## 2015-08-27 DIAGNOSIS — G4700 Insomnia, unspecified: Secondary | ICD-10-CM | POA: Diagnosis present

## 2015-08-27 DIAGNOSIS — E1169 Type 2 diabetes mellitus with other specified complication: Secondary | ICD-10-CM

## 2015-08-27 DIAGNOSIS — E861 Hypovolemia: Secondary | ICD-10-CM | POA: Diagnosis present

## 2015-08-27 DIAGNOSIS — Z833 Family history of diabetes mellitus: Secondary | ICD-10-CM

## 2015-08-27 DIAGNOSIS — F319 Bipolar disorder, unspecified: Secondary | ICD-10-CM | POA: Diagnosis present

## 2015-08-27 DIAGNOSIS — Z7984 Long term (current) use of oral hypoglycemic drugs: Secondary | ICD-10-CM | POA: Diagnosis not present

## 2015-08-27 DIAGNOSIS — F419 Anxiety disorder, unspecified: Secondary | ICD-10-CM | POA: Diagnosis present

## 2015-08-27 DIAGNOSIS — I10 Essential (primary) hypertension: Secondary | ICD-10-CM | POA: Diagnosis present

## 2015-08-27 DIAGNOSIS — E871 Hypo-osmolality and hyponatremia: Secondary | ICD-10-CM | POA: Diagnosis present

## 2015-08-27 DIAGNOSIS — Z79899 Other long term (current) drug therapy: Secondary | ICD-10-CM | POA: Diagnosis not present

## 2015-08-27 DIAGNOSIS — F329 Major depressive disorder, single episode, unspecified: Secondary | ICD-10-CM | POA: Diagnosis present

## 2015-08-27 DIAGNOSIS — F22 Delusional disorders: Secondary | ICD-10-CM | POA: Diagnosis present

## 2015-08-27 DIAGNOSIS — Z8249 Family history of ischemic heart disease and other diseases of the circulatory system: Secondary | ICD-10-CM | POA: Diagnosis not present

## 2015-08-27 DIAGNOSIS — Z818 Family history of other mental and behavioral disorders: Secondary | ICD-10-CM

## 2015-08-27 LAB — CBG MONITORING, ED: GLUCOSE-CAPILLARY: 202 mg/dL — AB (ref 65–99)

## 2015-08-27 LAB — GLUCOSE, CAPILLARY: Glucose-Capillary: 156 mg/dL — ABNORMAL HIGH (ref 65–99)

## 2015-08-27 MED ORDER — HYDROCHLOROTHIAZIDE 12.5 MG PO CAPS
12.5000 mg | ORAL_CAPSULE | Freq: Every day | ORAL | Status: DC
Start: 2015-08-27 — End: 2015-08-28
  Administered 2015-08-28: 12.5 mg via ORAL
  Filled 2015-08-27 (×4): qty 1

## 2015-08-27 MED ORDER — ZOLPIDEM TARTRATE 5 MG PO TABS
5.0000 mg | ORAL_TABLET | Freq: Every evening | ORAL | Status: DC | PRN
  Administered 2015-08-27: 5 mg via ORAL
  Filled 2015-08-27: qty 1

## 2015-08-27 MED ORDER — LORAZEPAM 1 MG PO TABS
1.0000 mg | ORAL_TABLET | Freq: Three times a day (TID) | ORAL | Status: DC | PRN
  Administered 2015-08-28: 1 mg via ORAL
  Filled 2015-08-27: qty 1

## 2015-08-27 MED ORDER — ALUM & MAG HYDROXIDE-SIMETH 200-200-20 MG/5ML PO SUSP: 30.0000 mL | ORAL | Status: DC | PRN

## 2015-08-27 MED ORDER — MAGNESIUM HYDROXIDE 400 MG/5ML PO SUSP: 30.0000 mL | Freq: Every day | ORAL | Status: DC | PRN

## 2015-08-27 MED ORDER — IBUPROFEN 200 MG PO TABS
600.0000 mg | ORAL_TABLET | Freq: Three times a day (TID) | ORAL | Status: DC | PRN
  Administered 2015-08-28: 600 mg via ORAL
  Filled 2015-08-27: qty 1

## 2015-08-27 MED ORDER — ACETAMINOPHEN 325 MG PO TABS
650.0000 mg | ORAL_TABLET | ORAL | Status: DC | PRN
  Administered 2015-08-27: 650 mg via ORAL
  Filled 2015-08-27 (×2): qty 2

## 2015-08-27 MED ORDER — BUSPIRONE HCL 10 MG PO TABS
5.0000 mg | ORAL_TABLET | Freq: Two times a day (BID) | ORAL | Status: DC
  Administered 2015-08-27 – 2015-08-28 (×3): 5 mg via ORAL
  Filled 2015-08-27 (×8): qty 1

## 2015-08-27 MED ORDER — TRAZODONE HCL 50 MG PO TABS
50.0000 mg | ORAL_TABLET | Freq: Every day | ORAL | Status: DC
Start: 2015-08-27 — End: 2015-08-28
  Administered 2015-08-27: 50 mg via ORAL
  Filled 2015-08-27 (×3): qty 1

## 2015-08-27 MED ORDER — METFORMIN HCL 850 MG PO TABS
850.0000 mg | ORAL_TABLET | Freq: Two times a day (BID) | ORAL | Status: DC
  Administered 2015-08-27 – 2015-08-28 (×3): 850 mg via ORAL
  Filled 2015-08-27 (×8): qty 1

## 2015-08-27 MED ORDER — ACETAMINOPHEN 325 MG PO TABS
650.0000 mg | ORAL_TABLET | Freq: Four times a day (QID) | ORAL | Status: DC | PRN
  Administered 2015-08-28: 650 mg via ORAL

## 2015-08-27 MED ORDER — ONDANSETRON HCL 4 MG PO TABS
4.0000 mg | ORAL_TABLET | Freq: Three times a day (TID) | ORAL | Status: DC | PRN
  Administered 2015-08-28: 4 mg via ORAL
  Filled 2015-08-27: qty 1

## 2015-08-27 MED ORDER — LISINOPRIL 10 MG PO TABS
10.0000 mg | ORAL_TABLET | Freq: Every day | ORAL | Status: DC
  Administered 2015-08-28: 10 mg via ORAL
  Filled 2015-08-27 (×4): qty 1
  Filled 2015-08-27: qty 2

## 2015-08-27 NOTE — ED Notes (Signed)
Patient standing in doorway crying and tearful asking for the chaplain r/t being paranoid. The patient reports that she is feeling confused and is unsure of what she is to believe. The patient is given reassurances and patient back to room

## 2015-08-27 NOTE — ED Notes (Signed)
Patient noted in room. No complaints, stable, in no acute distress. Hourly rounds to continue.  

## 2015-08-27 NOTE — Progress Notes (Signed)
Patient ID: [name] [name], female   DOB: Oct 18, 1970, 45 y.o.   MRN: [id number]   45 year old white female admitted after she presented to [facility] reporting severe anxiety and depression. Pt reported at time of admission that she had lost her mind, and that her memory was gone. Pt reported that she could not remember anything, it was apparent that patient was having some thought blocking. Pt reported that she lives with her son, and that she lost everything when her sister moved to [facility]. Pt reported that her depression was a 8, her hopelessness was a 8, and her anxiety was a 10. Pt reported being negative SI/HI, no AH/VH noted.

## 2015-08-27 NOTE — Progress Notes (Signed)
Patient accepted to [facility], room [phone]. Services of Dr. Elna Breslow. Bed to be available later today. Rosey Bath, RN

## 2015-08-27 NOTE — BHH Counselor (Signed)
Patient has been accepted to [facility] 503-2 and can come between 4:30 and 5:00PM today. Informed MP and patients nurse. Will obtain proper paperwork from patient.    Davina Poke, LCSW Therapeutic Triage Specialist [facility] 08/27/2015 3:54 PM

## 2015-08-27 NOTE — BHH Counselor (Signed)
Patient reviewed and signed voluntary consent to treat and ROI to her son [name]. Patient appeared confused and states "I can do this" when signing the paperwork. Patient asked if she was spelling her name right. This Clinical research associate offered encouragement.  Faxed paperwork to [facility] and provided to patients nurse for transport.  Call nurse report to [phone] and call [facility] for transportation at [phone]  [facility], LCSW Therapeutic Triage Specialist [facility] 08/27/2015 4:26 PM

## 2015-08-27 NOTE — Progress Notes (Signed)
Adult Psychoeducational Group Note  Date:  08/27/2015 Time:  10:44 PM  Group Topic/Focus:  Wrap-Up Group:   The focus of this group is to help patients review their daily goal of treatment and discuss progress on daily workbooks.  Participation Level:  Minimal  Participation Quality:  Inattentive  Affect:  Flat  Cognitive:  Lacking  Insight: Lacking  Engagement in Group:  Engaged  Modes of Intervention:  Socialization and Support  Additional Comments:  Patient attended and participated in group tonight. She reports that she could not get anything accomplished today. She is not sure if her medications were changed. Her son did not call her. Nothing went right for her today.  Lita Mains [facility] 08/27/2015, 10:44 PM

## 2015-08-27 NOTE — ED Notes (Signed)
Patient noted in room. No complaints, stable, in no acute distress. Hourly rounds to continue.

## 2015-08-27 NOTE — Tx Team (Addendum)
Initial Interdisciplinary Treatment Plan   PATIENT STRESSORS: Health problems Traumatic event   PATIENT STRENGTHS: General fund of knowledge Supportive family/friends   PROBLEM LIST: Problem List/Patient Goals Date to be addressed Date deferred Reason deferred Estimated date of resolution  "my mind is gone" 08/27/15           depression 08/27/15           Increased risk for SI 08/27/15                              DISCHARGE CRITERIA:  Improved stabilization in mood, thinking, and/or behavior Need for constant or close observation no longer present  PRELIMINARY DISCHARGE PLAN: Return to previous living arrangement  PATIENT/FAMIILY INVOLVEMENT: This treatment plan has been presented to and reviewed with the patient, [name] [name], and/or family member.  The patient and family have been given the opportunity to ask questions and make suggestions.  [name] [name] 08/27/2015, 5:45 PM

## 2015-08-27 NOTE — ED Notes (Signed)
Patient denies pain and is resting comfortably.  

## 2015-08-27 NOTE — ED Notes (Signed)
Patient very tearful, and states that she is hopeless. She is unsure of who she is to believe. The patient took her meds without any questions.

## 2015-08-27 NOTE — Progress Notes (Signed)
D: Pt presents anxious, paranoid, and fearful. Pt noted to be child-like in behavior. Pt verbalizes being anxious over several "what ifs". Pt required constant redirection to refocus her thoughts. Pt would easily awaken upon each q91min checks. Pt recorded for only one quality hour of sleep. Pt was placed in the quiet room due to paranoia with her roommate. Pt denies any SI. "I feel at ease here". Pt also denied any HI/AVH. Pt reports having short term memory loss. Pt reports having short term memory loss since the death of her fiance 2 years ago. Pt positive for thought-blocking. A: Writer administered scheduled and prn medications to pt, per MD orders. Continued support and availability as needed was extended to this pt. Staff continues to monitor pt with q44min checks.  R: No adverse drug reactions noted. Pt receptive to treatment. Pt remains safe at this time.

## 2015-08-28 ENCOUNTER — Encounter (HOSPITAL_COMMUNITY): Payer: Self-pay | Admitting: *Deleted

## 2015-08-28 ENCOUNTER — Encounter (HOSPITAL_COMMUNITY): Payer: Self-pay | Admitting: Psychiatry

## 2015-08-28 ENCOUNTER — Observation Stay (HOSPITAL_COMMUNITY)
Admission: AD | Admit: 2015-08-28 | Discharge: 2015-08-29 | Disposition: A | Discharge status: Other Acute Inpatient Hospital | Payer: Medicaid Other | Source: Ambulatory Visit | Attending: Internal Medicine | Admitting: Internal Medicine

## 2015-08-28 DIAGNOSIS — F3130 Bipolar disorder, current episode depressed, mild or moderate severity, unspecified: Secondary | ICD-10-CM | POA: Diagnosis not present

## 2015-08-28 DIAGNOSIS — F319 Bipolar disorder, unspecified: Secondary | ICD-10-CM | POA: Diagnosis present

## 2015-08-28 DIAGNOSIS — E119 Type 2 diabetes mellitus without complications: Secondary | ICD-10-CM

## 2015-08-28 DIAGNOSIS — J45909 Unspecified asthma, uncomplicated: Secondary | ICD-10-CM | POA: Insufficient documentation

## 2015-08-28 DIAGNOSIS — E1169 Type 2 diabetes mellitus with other specified complication: Secondary | ICD-10-CM

## 2015-08-28 DIAGNOSIS — Z79899 Other long term (current) drug therapy: Secondary | ICD-10-CM | POA: Insufficient documentation

## 2015-08-28 DIAGNOSIS — F17200 Nicotine dependence, unspecified, uncomplicated: Secondary | ICD-10-CM | POA: Clinically undetermined

## 2015-08-28 DIAGNOSIS — E871 Hypo-osmolality and hyponatremia: Principal | ICD-10-CM | POA: Diagnosis present

## 2015-08-28 DIAGNOSIS — I10 Essential (primary) hypertension: Secondary | ICD-10-CM | POA: Diagnosis present

## 2015-08-28 DIAGNOSIS — Z7984 Long term (current) use of oral hypoglycemic drugs: Secondary | ICD-10-CM | POA: Insufficient documentation

## 2015-08-28 DIAGNOSIS — F419 Anxiety disorder, unspecified: Secondary | ICD-10-CM | POA: Insufficient documentation

## 2015-08-28 LAB — CBC
HCT: 32.2 % — ABNORMAL LOW (ref 36.0–46.0)
HEMOGLOBIN: 11.5 g/dL — AB (ref 12.0–15.0)
MCH: 29.8 pg (ref 26.0–34.0)
MCHC: 35.7 g/dL (ref 30.0–36.0)
MCV: 83.4 fL (ref 78.0–100.0)
PLATELETS: 235 10*3/uL (ref 150–400)
RBC: 3.86 MIL/uL — ABNORMAL LOW (ref 3.87–5.11)
RDW: 12.6 % (ref 11.5–15.5)
WBC: 9.7 10*3/uL (ref 4.0–10.5)

## 2015-08-28 LAB — BASIC METABOLIC PANEL
ANION GAP: 9 (ref 5–15)
BUN: 14 mg/dL (ref 6–20)
CHLORIDE: 94 mmol/L — AB (ref 101–111)
CO2: 25 mmol/L (ref 22–32)
Calcium: 8.8 mg/dL — ABNORMAL LOW (ref 8.9–10.3)
Creatinine, Ser: 0.96 mg/dL (ref 0.44–1.00)
GFR calc non Af Amer: >60 mL/min (ref >60)
Glucose, Bld: 102 mg/dL — ABNORMAL HIGH (ref 65–99)
POTASSIUM: 3.3 mmol/L — AB (ref 3.5–5.1)
SODIUM: 128 mmol/L — AB (ref 135–145)

## 2015-08-28 LAB — GLUCOSE, CAPILLARY
GLUCOSE-CAPILLARY: 194 mg/dL — AB (ref 65–99)
GLUCOSE-CAPILLARY: 109 mg/dL — AB (ref 65–99)
Glucose-Capillary: 153 mg/dL — ABNORMAL HIGH (ref 65–99)

## 2015-08-28 LAB — OSMOLALITY: Osmolality: 265 mOsm/kg — ABNORMAL LOW (ref 275–295)

## 2015-08-28 LAB — PREGNANCY, URINE: Preg Test, Ur: NEGATIVE

## 2015-08-28 MED ORDER — BUSPIRONE HCL 5 MG PO TABS: 5.0000 mg | ORAL_TABLET | Freq: Two times a day (BID) | ORAL | Status: DC

## 2015-08-28 MED ORDER — LORAZEPAM 1 MG PO TABS
1.0000 mg | ORAL_TABLET | ORAL | Status: DC | PRN
Start: 2015-08-28 — End: 2015-08-28

## 2015-08-28 MED ORDER — LAMOTRIGINE 25 MG PO TABS: 25.0000 mg | ORAL_TABLET | Freq: Every day | ORAL | Status: DC

## 2015-08-28 MED ORDER — LAMOTRIGINE 25 MG PO TABS
25.0000 mg | ORAL_TABLET | Freq: Every day | ORAL | Status: DC
  Administered 2015-08-28 – 2015-08-29 (×2): 25 mg via ORAL
  Filled 2015-08-28 (×2): qty 1

## 2015-08-28 MED ORDER — GLUCOSE BLOOD VI STRP: ORAL_STRIP | Status: DC

## 2015-08-28 MED ORDER — HYDROXYZINE HCL 25 MG PO TABS
25.0000 mg | ORAL_TABLET | Freq: Four times a day (QID) | ORAL | Status: DC | PRN
  Administered 2015-08-29 (×2): 25 mg via ORAL
  Filled 2015-08-28 (×2): qty 1

## 2015-08-28 MED ORDER — LAMOTRIGINE 25 MG PO TABS
25.0000 mg | ORAL_TABLET | Freq: Every day | ORAL | Status: DC
  Administered 2015-08-28: 25 mg via ORAL
  Filled 2015-08-28 (×4): qty 1

## 2015-08-28 MED ORDER — ENOXAPARIN SODIUM 40 MG/0.4ML ~~LOC~~ SOLN
40.0000 mg | SUBCUTANEOUS | Status: DC
  Administered 2015-08-28: 40 mg via SUBCUTANEOUS
  Filled 2015-08-28 (×2): qty 0.4

## 2015-08-28 MED ORDER — ALBUTEROL SULFATE (2.5 MG/3ML) 0.083% IN NEBU
3.0000 mL | INHALATION_SOLUTION | Freq: Four times a day (QID) | RESPIRATORY_TRACT | Status: DC | PRN
  Administered 2015-08-29: 3 mL via RESPIRATORY_TRACT
  Filled 2015-08-28: qty 3

## 2015-08-28 MED ORDER — LISINOPRIL-HYDROCHLOROTHIAZIDE 10-12.5 MG PO TABS: 1.0000 | ORAL_TABLET | Freq: Every day | ORAL | Status: DC

## 2015-08-28 MED ORDER — NICOTINE POLACRILEX 2 MG MT GUM: 2.0000 mg | CHEWING_GUM | OROMUCOSAL | Status: DC | PRN

## 2015-08-28 MED ORDER — CLONAZEPAM 0.5 MG PO TABS
0.5000 mg | ORAL_TABLET | Freq: Two times a day (BID) | ORAL | Status: DC | PRN
  Administered 2015-08-29: 0.5 mg via ORAL
  Filled 2015-08-28: qty 1

## 2015-08-28 MED ORDER — INSULIN ASPART 100 UNIT/ML ~~LOC~~ SOLN: 0.0000 [IU] | Freq: Every day | SUBCUTANEOUS | Status: DC

## 2015-08-28 MED ORDER — SODIUM CHLORIDE 0.9 % IV SOLN
INTRAVENOUS | Status: DC
Start: 2015-08-28 — End: 2015-08-29
  Administered 2015-08-28: 21:00:00 via INTRAVENOUS

## 2015-08-28 MED ORDER — CLONAZEPAM 0.5 MG PO TABS: 0.5000 mg | ORAL_TABLET | Freq: Two times a day (BID) | ORAL | Status: DC | PRN

## 2015-08-28 MED ORDER — DOXEPIN HCL 10 MG PO CAPS: 10.0000 mg | ORAL_CAPSULE | Freq: Every day | ORAL | Status: DC

## 2015-08-28 MED ORDER — OLANZAPINE 5 MG PO TBDP
5.0000 mg | ORAL_TABLET | Freq: Every day | ORAL | Status: DC
  Administered 2015-08-28: 5 mg via ORAL
  Filled 2015-08-28 (×2): qty 1

## 2015-08-28 MED ORDER — OLANZAPINE 5 MG PO TBDP
5.0000 mg | ORAL_TABLET | Freq: Every day | ORAL | Status: DC
  Filled 2015-08-28 (×2): qty 1

## 2015-08-28 MED ORDER — BUSPIRONE HCL 5 MG PO TABS
5.0000 mg | ORAL_TABLET | Freq: Two times a day (BID) | ORAL | Status: DC
  Administered 2015-08-28 – 2015-08-29 (×2): 5 mg via ORAL
  Filled 2015-08-28 (×3): qty 1

## 2015-08-28 MED ORDER — METFORMIN HCL 850 MG PO TABS
850.0000 mg | ORAL_TABLET | Freq: Two times a day (BID) | ORAL | Status: DC
  Administered 2015-08-29: 850 mg via ORAL
  Filled 2015-08-28 (×3): qty 1

## 2015-08-28 MED ORDER — METFORMIN HCL 850 MG PO TABS: 850.0000 mg | ORAL_TABLET | Freq: Two times a day (BID) | ORAL | Status: DC

## 2015-08-28 MED ORDER — ONDANSETRON HCL 4 MG/2ML IJ SOLN
4.0000 mg | Freq: Four times a day (QID) | INTRAMUSCULAR | Status: DC | PRN
  Administered 2015-08-28 – 2015-08-29 (×3): 4 mg via INTRAVENOUS
  Filled 2015-08-28 (×3): qty 2

## 2015-08-28 MED ORDER — DOXEPIN HCL 10 MG PO CAPS
10.0000 mg | ORAL_CAPSULE | Freq: Every day | ORAL | Status: DC
  Filled 2015-08-28 (×2): qty 1

## 2015-08-28 MED ORDER — PROMETHAZINE HCL 25 MG/ML IJ SOLN
12.5000 mg | Freq: Four times a day (QID) | INTRAMUSCULAR | Status: AC | PRN
  Administered 2015-08-28 – 2015-08-29 (×2): 12.5 mg via INTRAVENOUS
  Filled 2015-08-28 (×2): qty 1

## 2015-08-28 MED ORDER — INSULIN ASPART 100 UNIT/ML ~~LOC~~ SOLN
0.0000 [IU] | Freq: Three times a day (TID) | SUBCUTANEOUS | Status: DC
  Administered 2015-08-29: 3 [IU] via SUBCUTANEOUS

## 2015-08-28 MED ORDER — FREESTYLE SYSTEM KIT: 1.0000 | PACK | Status: DC | PRN

## 2015-08-28 MED ORDER — NICOTINE POLACRILEX 2 MG MT GUM
2.0000 mg | CHEWING_GUM | OROMUCOSAL | Status: DC | PRN
  Administered 2015-08-28: 2 mg via ORAL
  Filled 2015-08-28: qty 1

## 2015-08-28 MED ORDER — ZIPRASIDONE MESYLATE 20 MG IM SOLR: 20.0000 mg | INTRAMUSCULAR | Status: DC | PRN

## 2015-08-28 MED ORDER — SODIUM CHLORIDE 0.9% FLUSH
3.0000 mL | Freq: Two times a day (BID) | INTRAVENOUS | Status: DC
  Administered 2015-08-29: 3 mL via INTRAVENOUS

## 2015-08-28 MED ORDER — SODIUM CHLORIDE 0.9 % IV BOLUS (SEPSIS)
1000.0000 mL | Freq: Once | INTRAVENOUS | Status: AC
  Administered 2015-08-28: 1000 mL via INTRAVENOUS

## 2015-08-28 MED ORDER — RISPERIDONE 2 MG PO TBDP
2.0000 mg | ORAL_TABLET | Freq: Three times a day (TID) | ORAL | Status: DC | PRN
  Filled 2015-08-28: qty 1

## 2015-08-28 MED ORDER — HYDROXYZINE HCL 25 MG PO TABS: 25.0000 mg | ORAL_TABLET | Freq: Four times a day (QID) | ORAL | Status: DC | PRN

## 2015-08-28 MED ORDER — ALBUTEROL SULFATE HFA 108 (90 BASE) MCG/ACT IN AERS
2.0000 | INHALATION_SPRAY | Freq: Four times a day (QID) | RESPIRATORY_TRACT | Status: DC | PRN
Start: 2015-08-28 — End: 2015-09-01

## 2015-08-28 MED ORDER — ONDANSETRON HCL 4 MG PO TABS: 4.0000 mg | ORAL_TABLET | Freq: Four times a day (QID) | ORAL | Status: DC | PRN

## 2015-08-28 MED ORDER — DOXEPIN HCL 10 MG PO CAPS
10.0000 mg | ORAL_CAPSULE | Freq: Every day | ORAL | Status: DC
  Administered 2015-08-28: 10 mg via ORAL
  Filled 2015-08-28 (×2): qty 1

## 2015-08-28 MED ORDER — HYDROXYZINE HCL 25 MG PO TABS
25.0000 mg | ORAL_TABLET | Freq: Four times a day (QID) | ORAL | Status: DC | PRN
  Administered 2015-08-28: 25 mg via ORAL
  Filled 2015-08-28: qty 1

## 2015-08-28 MED ORDER — TRAZODONE HCL 50 MG PO TABS
50.0000 mg | ORAL_TABLET | Freq: Every day | ORAL | Status: DC
  Administered 2015-08-28: 50 mg via ORAL
  Filled 2015-08-28 (×2): qty 1

## 2015-08-28 MED ORDER — ATENOLOL 25 MG PO TABS
25.0000 mg | ORAL_TABLET | Freq: Every day | ORAL | Status: DC
  Administered 2015-08-28 – 2015-08-29 (×2): 25 mg via ORAL
  Filled 2015-08-28 (×2): qty 1

## 2015-08-28 MED ORDER — GABAPENTIN 300 MG PO CAPS
300.0000 mg | ORAL_CAPSULE | Freq: Every day | ORAL | Status: DC
  Administered 2015-08-28 – 2015-08-29 (×2): 300 mg via ORAL
  Filled 2015-08-28 (×2): qty 1

## 2015-08-28 MED ORDER — IBUPROFEN 600 MG PO TABS: 600.0000 mg | ORAL_TABLET | Freq: Three times a day (TID) | ORAL | Status: DC | PRN

## 2015-08-28 MED ORDER — OLANZAPINE 5 MG PO TBDP
5.0000 mg | ORAL_TABLET | Freq: Every day | ORAL | Status: DC
Start: 2015-08-28 — End: 2015-09-01

## 2015-08-28 NOTE — BHH Suicide Risk Assessment (Signed)
[facility] Admission Suicide Risk Assessment   Nursing information obtained from:  Patient Demographic factors:  Caucasian Current Mental Status:  NA Loss Factors:  Decline in physical health Historical Factors:  Prior suicide attempts Risk Reduction Factors:  Living with another person, especially a relative  Total Time spent with patient: 30 minutes Principal Problem: Bipolar I disorder, current episode depressed (HCC) Diagnosis:   Patient Active Problem List   Diagnosis Date Noted  . Bipolar I disorder, current episode depressed (HCC) [F31.30] 08/28/2015  . Altered mental status [R41.82] 07/15/2012  . Aphthous ulcer [K12.0] 06/05/2012  . DM [E11.9] 10/26/2009  . OBESITY [E66.9] 10/26/2009  . ESSENTIAL HYPERTENSION [I10] 10/26/2009  . DISTURBANCE OF SKIN SENSATION [R20.9] 10/26/2009  . TOBACCO USE [F17.200] 12/13/2008  . MIGRAINE HEADACHE [G43.909] 03/05/2007   Subjective Data: Please see H&P.   Continued Clinical Symptoms:  Alcohol Use Disorder Identification Test Final Score (AUDIT): 0 The "Alcohol Use Disorders Identification Test", Guidelines for Use in Primary Care, Second Edition.  World Science writer [facility]). Score between 0-7:  no or low risk or alcohol related problems. Score between 8-15:  moderate risk of alcohol related problems. Score between 16-19:  high risk of alcohol related problems. Score 20 or above:  warrants further diagnostic evaluation for alcohol dependence and treatment.   CLINICAL FACTORS:   Unstable or Poor Therapeutic Relationship Previous Psychiatric Diagnoses and Treatments   Musculoskeletal: Strength & Muscle Tone: within normal limits Gait & Station: normal Patient leans: N/A  Psychiatric Specialty Exam: Review of Systems  Psychiatric/Behavioral: Positive for depression and substance abuse. The patient is nervous/anxious and has insomnia.   All other systems reviewed and are negative.   Blood pressure 141/80, pulse 110, temperature  98.5 F (36.9 C), temperature source Oral, resp. rate 20, height 5\' 4"  (1.626 m), weight 79.379 kg (175 lb).Body mass index is 30.02 kg/(m^2).                  Please see H&P.                                       COGNITIVE FEATURES THAT CONTRIBUTE TO RISK:  Closed-mindedness, Polarized thinking and Thought constriction (tunnel vision)    SUICIDE RISK:   Moderate:  Frequent suicidal ideation with limited intensity, and duration, some specificity in terms of plans, no associated intent, good self-control, limited dysphoria/symptomatology, some risk factors present, and identifiable protective factors, including available and accessible social support.  PLAN OF CARE: Please see H&P.   I certify that inpatient services furnished can reasonably be expected to improve the patient's condition.   Sula Fetterly, MD 08/28/2015, 11:34 AM

## 2015-08-28 NOTE — BHH Suicide Risk Assessment (Signed)
[facility] Discharge Suicide Risk Assessment   Principal Problem: Bipolar I disorder, current episode depressed [facility]) Discharge Diagnoses:  Patient Active Problem List   Diagnosis Date Noted  . Bipolar I disorder, current episode depressed (HCC) [F31.30] 08/28/2015  . Tobacco use disorder [F17.200] 08/28/2015  . HTN (hypertension) [I10] 08/28/2015  . Diabetes (HCC) [E11.9] 08/28/2015  . Hyponatremia [E87.1] 08/28/2015  . Altered mental status [R41.82] 07/15/2012  . Aphthous ulcer [K12.0] 06/05/2012  . DM [E11.9] 10/26/2009  . OBESITY [E66.9] 10/26/2009  . ESSENTIAL HYPERTENSION [I10] 10/26/2009  . DISTURBANCE OF SKIN SENSATION [R20.9] 10/26/2009  . MIGRAINE HEADACHE [G43.909] 03/05/2007    Total Time spent with patient: 30 minutes  Musculoskeletal: Strength & Muscle Tone: within normal limits Gait & Station: normal Patient leans: N/A  Psychiatric Specialty Exam: Review of Systems  Gastrointestinal: Positive for nausea.  Psychiatric/Behavioral: Positive for depression. The patient is nervous/anxious.   All other systems reviewed and are negative.   Blood pressure 136/80, pulse 102, temperature 97.9 F (36.6 C), temperature source Oral, resp. rate 17, height 5\' 4"  (1.626 m), weight 79.379 kg (175 lb).Body mass index is 30.02 kg/(m^2).  General Appearance: Casual  Eye Contact::  Fair  Speech:  Clear and Coherent409  Volume:  Normal  Mood:  Anxious  Affect:  Congruent  Thought Process:  Coherent  Orientation:  Full (Time, Place, and Person)  Thought Content:  Rumination  Suicidal Thoughts:  No  Homicidal Thoughts:  No  Memory:  Immediate;   Fair Recent;   Fair Remote;   Fair  Judgement:  Fair  Insight:  Fair  Psychomotor Activity:  Restlessness  Concentration:  Fair  Recall:  [employer] of Knowledge:Fair  Language: Fair  Akathisia:  No  Handed:  Right  AIMS (if indicated):     Assets:  Desire for Improvement  Sleep:  Number of Hours: 1  Cognition: WNL  ADL's:   Intact   Mental Status Per Nursing Assessment::   On Admission:  NA  Demographic Factors:  Caucasian  Loss Factors: Financial problems/change in socioeconomic status  Historical Factors: Impulsivity  Risk Reduction Factors:   Positive social support  Continued Clinical Symptoms:  Previous Psychiatric Diagnoses and Treatments Medical Diagnoses and Treatments/Surgeries  Cognitive Features That Contribute To Risk:  Polarized thinking    Suicide Risk:  Minimal: No identifiable suicidal ideation.  Patients presenting with no risk factors but with morbid ruminations; may be classified as minimal risk based on the severity of the depressive symptoms    Plan Of Care/Follow-up recommendations:  Activity:  as per hospitalist Diet:  carb modified Tests:  as per hospitalist Other:  patient to be transferred to [facility] for medical management of hyponatremia  [name] Gossen, MD 08/28/2015, 2:23 PM

## 2015-08-28 NOTE — Discharge Summary (Addendum)
Physician Discharge Summary Note  Patient:  [name] [name] is an 45 y.o., female MRN:  [id number] DOB:  [birth year] Patient phone:  [phone] (home)  Patient address:   [place] [address],  Total Time spent with patient: 30 minutes  Date of Admission:  08/27/2015  Date of Discharge: 08-28-15  Reason for Admission: Worsening symptoms of Bipolar disorder  Principal Problem: Bipolar I disorder, current episode depressed [facility])  Discharge Diagnoses: Patient Active Problem List   Diagnosis Date Noted  . Bipolar I disorder, current episode depressed ([place]) [F31.30] 08/28/2015  . Tobacco use disorder [F17.200] 08/28/2015  . HTN (hypertension) [I10] 08/28/2015  . Diabetes ([place]) [E11.9] 08/28/2015  . Hyponatremia [E87.1] 08/28/2015  . Altered mental status [R41.82] 07/15/2012  . Aphthous ulcer [K12.0] 06/05/2012  . DM [E11.9] 10/26/2009  . OBESITY [E66.9] 10/26/2009  . ESSENTIAL HYPERTENSION [I10] 10/26/2009  . DISTURBANCE OF SKIN SENSATION [R20.9] 10/26/2009  . MIGRAINE HEADACHE [G43.909] 03/05/2007   Past Psychiatric History: Bipolar disorder  Past Medical History:  Past Medical History  Diagnosis Date  . Diabetes mellitus   . Asthma   . Anxiety disorder   . Bipolar affective disorder ([place])   . Depression   . Migraine     Past Surgical History  Procedure Laterality Date  . Knee arthroscopy    . Tubal ligation     Family History:  Family History  Problem Relation Age of Onset  . Bipolar disorder Mother   . Hypertension Father   . Diabetes Father    Family Psychiatric  History: See H&P  Social History:  History  Alcohol Use  . Yes    Comment: occasionally     History  Drug Use No    Social History   Social History  . Marital Status: Divorced    Spouse Name: N/A  . Number of Children: N/A  . Years of Education: N/A   Social History Main Topics  . Smoking status: Current Every Day Smoker  . Smokeless tobacco: Never Used  . Alcohol  Use: Yes     Comment: occasionally  . Drug Use: No  . Sexual Activity: Yes    Birth Control/ Protection: Surgical   Other Topics Concern  . None   Social History Narrative   Patient lives in [place] with her 2 kids- 78 in 28 years old.   She has total 4 kids.   She is a single mom.         Hospital Course: [name] [name] is a 45 y.o.caucasian female , who has a hx of bipolar disorder, who presented to [facility] reporting increasing anxiety and depression.   During his admission assessment & chart review, it was noted per most recent & current lab results that his sodium levels were persistently very low. A hospitalist consult was requested for evaluation & treatment recommendation. After the hospitalist evaluation, it was recommended for patient to be transferred to the [facility] treatment. Here is the hospitalist recommendation below.   I am recommending transfer to [facility] for further management of what is likely multifactorial hyponatremia (polypharmacy, excessive free water intake, hypovolemia). I think she would benefit from gentle IV fluid resuscitation with normal saline. HCTZ and trazodone have already been held, and I would continue holding these medication and be mindful of polypharmacy. PRN hydralazine can be used for accelerated HTN. OK to continue home doses of lisinopril and atenolol. I expect she will only need 23 hour observation. She should be  fine to return to the [facility] unit in the morning as long as her sodium level is greater than 130.  Physical Findings: AIMS:  , ,  ,  ,    CIWA:    COWS:     Musculoskeletal: Strength & Muscle Tone: within normal limits Gait & Station: normal Patient leans: N/A  Psychiatric Specialty Exam: Review of Systems  Constitutional: Positive for malaise/fatigue.  HENT: Negative.   Eyes: Negative.   Respiratory: Negative.   Cardiovascular: Negative.   Gastrointestinal: Negative.   Genitourinary: Negative.    Musculoskeletal: Negative.   Skin: Negative.   Neurological: Positive for weakness.  Endo/Heme/Allergies: Negative.   Psychiatric/Behavioral: Positive for depression and substance abuse (Tobacco abuse). Negative for suicidal ideas, hallucinations and memory loss. The patient is nervous/anxious and has insomnia.     Blood pressure 136/80, pulse 102, temperature 97.9 F (36.6 C), temperature source Oral, resp. rate 17, height 5\' 4"  (1.626 m), weight 79.379 kg (175 lb), SpO2 98 %.Body mass index is 30.02 kg/(m^2).  See Md's SRA   Have you used any form of tobacco in the last 30 days? (Cigarettes, Smokeless Tobacco, Cigars, and/or Pipes): No  Has this patient used any form of tobacco in the last 30 days? (Cigarettes, Smokeless Tobacco, Cigars, and/or Pipes) Yes, N/A  Blood Alcohol level:  Lab Results  Component Value Date   [facility] <5 08/24/2015   ETH <11 04/29/2014    Metabolic Disorder Labs:  Lab Results  Component Value Date   HGBA1C 11.9* 04/29/2014   MPG 295* 04/29/2014   MPG 315* 05/21/2012   No results found for: PROLACTIN Lab Results  Component Value Date   CHOL 216* 10/26/2009   TRIG 466* 10/26/2009   HDL 46 10/26/2009   CHOLHDL 4.7 Ratio 10/26/2009   VLDL NOT CALC mg/dL 10/28/2009   LDLCALC See Comment mg/dL 51/88/4166   LDLCALC 12/08/1599* 04/29/2007    See Psychiatric Specialty Exam and Suicide Risk Assessment completed by Attending Physician prior to discharge.  Discharge destination:  Other:  05/01/2007 [facility] inpatient hospital  Is patient on multiple antipsychotic therapies at discharge:  No   Has Patient had three or more failed trials of antipsychotic monotherapy by history:  No  Recommended Plan for Multiple Antipsychotic Therapies: NA    Medication List    STOP taking these medications        atenolol 25 MG tablet  Commonly known as:  TENORMIN     gabapentin 300 MG capsule  Commonly known as:  NEURONTIN     promethazine 25 MG tablet  Commonly known as:   PHENERGAN     traZODone 50 MG tablet  Commonly known as:  DESYREL     zolpidem 10 MG tablet  Commonly known as:  AMBIEN      TAKE these medications      Indication   albuterol 108 (90 Base) MCG/ACT inhaler  Commonly known as:  PROVENTIL HFA;VENTOLIN HFA  Inhale 2 puffs into the lungs every 6 (six) hours as needed for wheezing.   Indication:  Asthma     busPIRone 5 MG tablet  Commonly known as:  BUSPAR  Take 1 tablet (5 mg total) by mouth 2 (two) times daily. For anxiety   Indication:  Anxiety Disorder     clonazePAM 0.5 MG tablet  Commonly known as:  KLONOPIN  Take 1 tablet (0.5 mg total) by mouth 2 (two) times daily as needed (anxiety).   Indication:  Severe anxiety     doxepin  10 MG capsule  Commonly known as:  SINEQUAN  Take 1 capsule (10 mg total) by mouth at bedtime. For anxiety/insomnia   Indication:  Anxiety/insomnia     glucose blood test strip  Commonly known as:  ONE TOUCH ULTRA TEST  Use as instructed: For blood sugar checks   Indication:  Blood sugar checks     glucose monitoring kit monitoring kit  1 each by Does not apply route as needed for other. 1 unit- For blood sugar checks   Indication:  Blood sugar checks     hydrOXYzine 25 MG tablet  Commonly known as:  ATARAX/VISTARIL  Take 1 tablet (25 mg total) by mouth every 6 (six) hours as needed for anxiety.   Indication:  Anxiety     ibuprofen 600 MG tablet  Commonly known as:  ADVIL,MOTRIN  Take 1 tablet (600 mg total) by mouth every 8 (eight) hours as needed (Temp > 38.3Celsius). For pain/fever   Indication:  Pain, fever     lamoTRIgine 25 MG tablet  Commonly known as:  LAMICTAL  Take 1 tablet (25 mg total) by mouth daily. For mood stabilization   Indication:  Mood stabilization     lisinopril-hydrochlorothiazide 10-12.5 MG tablet  Commonly known as:  PRINZIDE,ZESTORETIC  Take 1 tablet by mouth daily. For HTN   Indication:  High Blood Pressure     metFORMIN 850 MG tablet  Commonly known as:   GLUCOPHAGE  Take 1 tablet (850 mg total) by mouth 2 (two) times daily. For diabetes management   Indication:  Type 2 Diabetes     nicotine polacrilex 2 MG gum  Commonly known as:  NICORETTE  Take 1 each (2 mg total) by mouth as needed for smoking cessation (Q 2 H while awake.).   Indication:  Nicotine Addiction     OLANZapine zydis 5 MG disintegrating tablet  Commonly known as:  ZYPREXA  Take 1 tablet (5 mg total) by mouth at bedtime. For mood control   Indication:  Mood control       Follow-up recommendations: Inpatient admission due to low Sodium.   Comments: Transferred to the [facility] long Medical floor for treatment  Signed: Encarnacion Slates, NP, PMHNP, FNP-BC 08/28/2015, 4:57 PM

## 2015-08-28 NOTE — H&P (Signed)
[facility] History and Physical  [name] [name] YBO:[id number] DOB: July 02, 1970 DOA: 08/28/2015  Referring physician:  PCP: PROVIDER NOT IN SYSTEM   Chief Complaint: Hyponatremia  HPI: [name] [name] is a 45 y.o. female  the past medical history bipolar disorder, major depression, anxiety, presented as a transfer from behavioral health for hyponatremia. She was initially admitted to behavioral health for anxiety, depression, stating "I don't want to live." Medicine consulted for hyponatremia. On 08/24/2015 she had a sodium 125 which gradually trended up to 128 on 08/26/2015. She had been on hydrochlorothiazide and trazodone which may have contributed to hyponatremia. She reports ongoing feelings of depression, anxiety, feelings of hopelessness and tearfulness on occasion. She does not want to harm herself or others.                                             Review of Systems:  Constitutional:  No weight loss, night sweats, Fevers, chills, fatigue. Positive for dizziness HEENT:  No headaches, Difficulty swallowing,Tooth/dental problems,Sore throat,  No sneezing, itching, ear ache, nasal congestion, post nasal drip,  Cardio-vascular:  No chest pain, Orthopnea, PND, swelling in lower extremities, anasarca, dizziness, palpitations  GI:  No heartburn, indigestion, abdominal pain, nausea, vomiting, diarrhea, change in bowel habits, loss of appetite  Resp:  No shortness of breath with exertion or at rest. No excess mucus, no productive cough, No non-productive cough, No coughing up of blood.No change in color of mucus.No wheezing.No chest wall deformity  Skin:  no rash or lesions.  GU:  no dysuria, change in color of urine, no urgency or frequency. No flank pain.  Musculoskeletal:  No joint pain or swelling. No decreased range of motion. No back pain.  Psych:  Positive for depression, hopelessness, tearfulness  Past Medical History  Diagnosis Date  . Diabetes mellitus   .  Asthma   . Anxiety disorder   . Bipolar affective disorder ([place])   . Depression   . Migraine    Past Surgical History  Procedure Laterality Date  . Knee arthroscopy    . Tubal ligation     Social History:  reports that she has been smoking.  She has never used smokeless tobacco. She reports that she drinks alcohol. She reports that she does not use illicit drugs.  Allergies  Allergen Reactions  . Tramadol Nausea Only    "feeling like I'm on fire"  . Nicotine     Patient states she is allergic to the Nicotine patch and nicotine gum and they cause her to break out in a rash.  She states she can smoke cigarettes.    Family History  Problem Relation Age of Onset  . Bipolar disorder Mother   . Hypertension Father   . Diabetes Father     Prior to Admission medications   Medication Sig Start Date End Date Taking? Authorizing Provider  albuterol (PROVENTIL HFA;VENTOLIN HFA) 108 (90 Base) MCG/ACT inhaler Inhale 2 puffs into the lungs every 6 (six) hours as needed for wheezing. 08/28/15  Yes Encarnacion Slates, NP  atenolol (TENORMIN) 25 MG tablet Take 25 mg by mouth daily. 07/05/15  Yes Historical Provider, MD  busPIRone (BUSPAR) 5 MG tablet Take 1 tablet (5 mg total) by mouth 2 (two) times daily. For anxiety 08/28/15  Yes Encarnacion Slates, NP  clonazePAM (KLONOPIN) 0.5 MG tablet Take 1 tablet (0.5  mg total) by mouth 2 (two) times daily as needed (anxiety). 08/28/15  Yes Encarnacion Slates, NP  doxepin (SINEQUAN) 10 MG capsule Take 1 capsule (10 mg total) by mouth at bedtime. For anxiety/insomnia 08/28/15  Yes Encarnacion Slates, NP  gabapentin (NEURONTIN) 300 MG capsule TAKE 1 CAPSULE BY MOUTH DAILY Patient taking differently: TAKE 300 MG BY MOUTH THREE TIMES DAILY 08/27/12  Yes Pedro Earls, MD  glucose blood (ONE TOUCH ULTRA TEST) test strip Use as instructed: For blood sugar checks 08/28/15  Yes Encarnacion Slates, NP  glucose monitoring kit (FREESTYLE) monitoring kit 1 each by Does not apply route as needed for  other. 1 unit- For blood sugar checks 08/28/15  Yes Encarnacion Slates, NP  hydrOXYzine (ATARAX/VISTARIL) 25 MG tablet Take 1 tablet (25 mg total) by mouth every 6 (six) hours as needed for anxiety. 08/28/15  Yes Encarnacion Slates, NP  ibuprofen (ADVIL,MOTRIN) 600 MG tablet Take 1 tablet (600 mg total) by mouth every 8 (eight) hours as needed (Temp > 38.3Celsius). For pain/fever 08/28/15  Yes Encarnacion Slates, NP  lamoTRIgine (LAMICTAL) 25 MG tablet Take 1 tablet (25 mg total) by mouth daily. For mood stabilization 08/28/15  Yes Encarnacion Slates, NP  lisinopril-hydrochlorothiazide (PRINZIDE,ZESTORETIC) 10-12.5 MG tablet Take 1 tablet by mouth daily. For HTN 08/28/15  Yes Encarnacion Slates, NP  metFORMIN (GLUCOPHAGE) 850 MG tablet Take 1 tablet (850 mg total) by mouth 2 (two) times daily. For diabetes management 08/28/15  Yes Encarnacion Slates, NP  nicotine polacrilex (NICORETTE) 2 MG gum Take 1 each (2 mg total) by mouth as needed for smoking cessation (Q 2 H while awake.). 08/28/15  Yes Encarnacion Slates, NP  OLANZapine zydis (ZYPREXA) 5 MG disintegrating tablet Take 1 tablet (5 mg total) by mouth at bedtime. For mood control 08/28/15  Yes Encarnacion Slates, NP  promethazine (PHENERGAN) 25 MG tablet Take 1 tablet (25 mg total) by mouth every 6 (six) hours as needed for nausea. 06/07/12  Yes Junius Creamer, NP  traZODone (DESYREL) 50 MG tablet Take 50 mg by mouth at bedtime. 08/06/15  Yes Historical Provider, MD  zolpidem (AMBIEN) 10 MG tablet Take 10 mg by mouth at bedtime as needed for sleep.  08/07/15  Yes Historical Provider, MD   Physical Exam: Filed Vitals:   08/28/15 1726  BP: 139/85  Pulse: 94  Temp: 97.6 F (36.4 C)  TempSrc: Oral  Resp: 18  Height: _0  (1.626 m)  SpO2: 100%    Wt Readings from Last 3 Encounters:  08/27/15 79.379 kg (175 lb)  07/15/12 89.2 kg (196 lb 10.4 oz)  06/01/12 93.759 kg (206 lb 11.2 oz)    General:  Flat affect, having depressed mood. Eyes: PERRL, normal lids, irises & conjunctiva ENT:  grossly normal hearing, lips & tongue, dry oral mucosa Neck: no LAD, masses or thyromegaly Cardiovascular: RRR, no m/r/g. No LE edema. Telemetry: SR, no arrhythmias  Respiratory: CTA bilaterally, no w/r/r. Normal respiratory effort. Abdomen: soft, ntnd Skin: no rash or induration seen on limited exam Musculoskeletal: grossly normal tone BUE/BLE Psychiatric: grossly normal mood and affect, speech fluent and appropriate Neurologic: grossly non-focal.          Labs on Admission:  Basic Metabolic Panel:  Recent Labs Lab 08/24/15 2153 08/25/15 1656 08/25/15 2229 08/26/15 0635  NA 125* 126* 129* 128*  K 3.5 3.8 4.3 3.9  CL 88* 91* 95* 99*  CO2 _1 GLUCOSE 149*  158* 124* 165*  BUN _0 CREATININE 1.10* 1.04* 1.18* 0.94  CALCIUM 9.3 8.8* 9.0 8.4*   Liver Function Tests: No results for input(s): AST, ALT, ALKPHOS, BILITOT, PROT, ALBUMIN in the last 168 hours. No results for input(s): LIPASE, AMYLASE in the last 168 hours. No results for input(s): AMMONIA in the last 168 hours. CBC:  Recent Labs Lab 08/24/15 2153  WBC 12.5*  NEUTROABS 7.4  HGB 12.9  HCT 35.7*  MCV 83.6  PLT 271   Cardiac Enzymes: No results for input(s): CKTOTAL, CKMB, CKMBINDEX, TROPONINI in the last 168 hours.  BNP (last 3 results) No results for input(s): BNP in the last 8760 hours.  ProBNP (last 3 results) No results for input(s): PROBNP in the last 8760 hours.  CBG:  Recent Labs Lab 08/26/15 1202 08/27/15 1125 08/27/15 1717 08/28/15 0635 08/28/15 1706  GLUCAP 153* 202* 156* 194* 153*    Radiological Exams on Admission: No results found.  EKG: Independently reviewed.   Assessment/Plan Active Problems:   Hyponatremia   1. Hyponatremia. Lab work showing sodium of 125 on 08/24/2015 which has gradually trended up to 128 by 08/26/2015. Will check a sodium level at this time. I suspect hyponatremia is multifactorial with medications including hydrochlorothiazide as  well as possible dehydration. She reports having an episode of nausea vomiting at behavioral health. On exam she appears dry. Will provide IV fluids overnight. Check a urine sodium and urine osmolality well as orthostatic vital signs. Will discontinue hydrochlorothiazide.  2. Hypertension. Continue atenolol 25 mg by mouth daily 3. Diabetes mellitus. Will continue home regimen with metformin 850 mg by mouth twice a day, Accu-Cheks every before meals and daily at bedtime 4. Bipolar disorder. For now will continue Zyprexa 5 mg by mouth daily at bedtime, Lamictal 25 mg by mouth daily    Code Status: Full code DVT Prophylaxis: Lovenox Family Communication: Family not present Disposition Plan: Overnight observation  Time spent: 105 min  Kelvin Cellar [facility] Pager [phone]

## 2015-08-28 NOTE — [facility] Group Notes (Signed)
[facility] LCSW Group Therapy  08/28/2015 1:15 pm  Type of Therapy: Process Group Therapy  Participation Level:  Active  Participation Quality:  Appropriate  Affect:  Flat  Cognitive:  Oriented  Insight:  Improving  Engagement in Group:  Limited  Engagement in Therapy:  Limited  Modes of Intervention:  Activity, Clarification, Education, Problem-solving and Support  Summary of Progress/Problems: Today's group addressed the issue of overcoming obstacles.  Patients were asked to identify their biggest obstacle post d/c that stands in the way of their on-going success, and then problem solve as to how to manage this.  Invited.  Chose to not attend.  Ida Rogue 08/28/2015   3:14 PM
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
## 2015-08-28 NOTE — Progress Notes (Signed)
D: Pt presents tearful with depressed mood on initial contact. Pt was tangential and circumstantial during assessment conversations but was cooperative. Denied SI, HI, AVH and pain when assessed this AM. Attended scheduled unit groups and showered. Pt was transferred to [facility] for hydration therapy and was in agreement with treatment.  D: All scheduled medications and PRN medications administered as per [facility] with verbal education. Encouragement, support and availability offered to pt throughout this shift. Writer informed pt of changes made to treatment plan or orders. EKG done and was tolerated well. Q 15 minutes checks maintained for safety on and off unit without outburst or self harm gestures to note thus far this shift.  R: Pt receptive to care. Remains safe on and off unit. POC continues.

## 2015-08-28 NOTE — Consult Note (Signed)
[facility] History and Physical  [name] [name] ATF:[id number] DOB: 1971-04-13 DOA: 08/27/2015  Referring physician: Dr. Shea Evans  Reason for consult request: Hyponatremia  HPI: [name] [name] is a 45 y.o. woman with a history of Bipolar disorder, anxiety, depression, HTN, and DM (on metformin) who is admitted to the [facility] hospital for management of anxiety.  Hospitalist consult requested for persistent hyponatremia, which was documented at the time of admission.  Reviewing her home medications, HCTZ and trazodone could certainly be contributing.  The patient reports that she has been on the HCTZ for her blood pressure since August, and trazodone was added in the past month or so for PRN use for insomnia.  Also, she admits to drinking 5 bottles of water daily (almost 2.5 liters) in addition to coffee and tea.  She denies excessive thirst,- and says "I drink when I am nervous".  Her PO intake is poor at baseline; she has one meal daily.  She complains of palpitations now and intermittent light-headedness.  No syncope/LOC.  She relates intermittent chest pain and shortness of breath to her anxiety.  Review of Systems: 12 systems reviewed and negative except as stated in HPI.  Past Medical History  Diagnosis Date  . Diabetes mellitus   . Asthma   . Anxiety disorder   . Bipolar affective disorder ([place])   . Depression   . Migraine    Past Surgical History  Procedure Laterality Date  . Knee arthroscopy    . Tubal ligation    Left knee arthroscopy  Social History:  Social History   Social History Narrative   Patient lives in [place] with her 2 kids- 81 in 18 years old.   She has total 4 kids.   She is a single mom.        Active tobacco use.  No EtOH or illicit drug use.  She is not married; she has four children.  Allergies  Allergen Reactions  . Tramadol Nausea Only    "feeling like I'm on fire"  . Nicotine     Patient states she is allergic to the Nicotine  patch and nicotine gum and they cause her to break out in a rash.  She states she can smoke cigarettes.    Family History  Problem Relation Age of Onset  . Bipolar disorder Mother   . Hypertension Father   . Diabetes Father    Prior to Admission medications   Medication Sig Start Date End Date Taking? Authorizing Provider  albuterol (PROVENTIL HFA;VENTOLIN HFA) 108 (90 BASE) MCG/ACT inhaler Inhale 2 puffs into the lungs every 6 (six) hours as needed for wheezing. 05/21/12   Hadassah Pais, MD  atenolol (TENORMIN) 25 MG tablet Take 25 mg by mouth daily. 07/05/15   Historical Provider, MD  gabapentin (NEURONTIN) 300 MG capsule TAKE 1 CAPSULE BY MOUTH DAILY Patient taking differently: TAKE 300 MG BY MOUTH THREE TIMES DAILY 08/27/12   Pedro Earls, MD  glucose blood (ONE TOUCH ULTRA TEST) test strip Use as instructed 08/28/12   Rosana Hoes, MD  glucose monitoring kit (FREESTYLE) monitoring kit 1 each by Does not apply route as needed for other. 1 unit- 08/28/12   Rosana Hoes, MD  hydrOXYzine (ATARAX/VISTARIL) 25 MG tablet Take 1 tablet (25 mg total) by mouth every 6 (six) hours as needed for anxiety. 08/24/15   Domenic Moras, PA-C  lisinopril-hydrochlorothiazide (PRINZIDE,ZESTORETIC) 10-12.5 MG tablet Take 1 tablet by mouth daily. 07/05/15   Historical Provider, MD  metFORMIN (  GLUCOPHAGE) 850 MG tablet Take 850 mg by mouth 2 (two) times daily. 08/06/15   Historical Provider, MD  promethazine (PHENERGAN) 25 MG tablet Take 1 tablet (25 mg total) by mouth every 6 (six) hours as needed for nausea. 06/07/12   Junius Creamer, NP  traZODone (DESYREL) 50 MG tablet Take 50 mg by mouth at bedtime. 08/06/15   Historical Provider, MD  zolpidem (AMBIEN) 10 MG tablet Take 10 mg by mouth at bedtime as needed for sleep.  08/07/15   Historical Provider, MD   Physical Exam: Filed Vitals:   08/28/15 0057 08/28/15 0604 08/28/15 0605 08/28/15 1156  BP: 144/72 141/87 141/80 136/80  Pulse: 103 110 110 102  Temp:  98.5 F (36.9 C)   97.9 F (36.6 C)  TempSrc:  Oral  Oral  Resp:  20  17  Height:      Weight:         General:  Awake and alert.  Oriented to person, place, time and situation.  NAD but appears anxious.  Eyes: pupils appear equal bilaterally  ENT: Moist mucous membranes.  No nasal drainage.  Neck: Supple.   Cardiovascular: Mildly tachycardic but regular.  No significant pitting in her lower extremities.  Respiratory: Bilateral ronchi.  Abdomen: Soft/NT/ND.  Bowel sounds are present.  No guarding.  Skin: Warm and dry.  Musculoskeletal: Moves all four extremities spontaneously.  Psychiatric: Appears anxious.  Neurologic: No focal deficits.  Labs on Admission:  Basic Metabolic Panel:  Recent Labs Lab 08/24/15 2153 08/25/15 1656 08/25/15 2229 08/26/15 0635  NA 125* 126* 129* 128*  K 3.5 3.8 4.3 3.9  CL 88* 91* 95* 99*  CO2 '25 26 25 23  '$ GLUCOSE 149* 158* 124* 165*  BUN '12 11 13 12  '$ CREATININE 1.10* 1.04* 1.18* 0.94  CALCIUM 9.3 8.8* 9.0 8.4*   CBC:  Recent Labs Lab 08/24/15 2153  WBC 12.5*  NEUTROABS 7.4  HGB 12.9  HCT 35.7*  MCV 83.6  PLT 271   CBG:  Recent Labs Lab 08/25/15 1553 08/26/15 1202 08/27/15 1125 08/27/15 1717 08/28/15 0635  GLUCAP 161* 153* 202* 156* 194*   Assessment/Plan Principal Problem:   Bipolar I disorder, current episode depressed (HCC) Active Problems:   Tobacco use disorder   HTN (hypertension)   Diabetes ([place])   Hyponatremia  Thank you for this consultation.  I am recommending transfer to [facility] for further management of what is likely multifactorial hyponatremia (polypharmacy, excessive free water intake, hypovolemia).  I think she would benefit from gentle IV fluid resuscitation with normal saline.  HCTZ and trazodone have already been held, and I would continue holding these medication and be mindful of polypharmacy.  PRN hydralazine can be used for accelerated HTN.  OK to continue home doses of lisinopril and atenolol.  I  expect she will only need 23 hour observation.  She should be fine to return to the [facility] unit in the morning as long as her sodium level is greater than 130.  Code Status: FULL  Time spent: 45 minutes  [employer] [facility]  08/28/2015, 2:02 PM

## 2015-08-29 ENCOUNTER — Encounter (HOSPITAL_COMMUNITY): Payer: Self-pay | Admitting: *Deleted

## 2015-08-29 ENCOUNTER — Inpatient Hospital Stay (HOSPITAL_COMMUNITY)
Admission: AD | Admit: 2015-08-29 | Discharge: 2015-09-01 | DRG: 885 | Disposition: A | Discharge status: Home / Self Care | Payer: Medicaid Other | Source: Intra-hospital | Attending: Psychiatry | Admitting: Psychiatry

## 2015-08-29 DIAGNOSIS — F17200 Nicotine dependence, unspecified, uncomplicated: Secondary | ICD-10-CM | POA: Diagnosis not present

## 2015-08-29 DIAGNOSIS — F3112 Bipolar disorder, current episode manic without psychotic features, moderate: Secondary | ICD-10-CM | POA: Diagnosis present

## 2015-08-29 DIAGNOSIS — E871 Hypo-osmolality and hyponatremia: Secondary | ICD-10-CM | POA: Diagnosis present

## 2015-08-29 DIAGNOSIS — F3132 Bipolar disorder, current episode depressed, moderate: Principal | ICD-10-CM | POA: Diagnosis present

## 2015-08-29 DIAGNOSIS — I10 Essential (primary) hypertension: Secondary | ICD-10-CM | POA: Diagnosis present

## 2015-08-29 DIAGNOSIS — F17210 Nicotine dependence, cigarettes, uncomplicated: Secondary | ICD-10-CM | POA: Diagnosis present

## 2015-08-29 DIAGNOSIS — F319 Bipolar disorder, unspecified: Secondary | ICD-10-CM | POA: Diagnosis present

## 2015-08-29 DIAGNOSIS — E119 Type 2 diabetes mellitus without complications: Secondary | ICD-10-CM | POA: Diagnosis present

## 2015-08-29 DIAGNOSIS — F3130 Bipolar disorder, current episode depressed, mild or moderate severity, unspecified: Secondary | ICD-10-CM | POA: Diagnosis not present

## 2015-08-29 DIAGNOSIS — F314 Bipolar disorder, current episode depressed, severe, without psychotic features: Secondary | ICD-10-CM | POA: Diagnosis present

## 2015-08-29 LAB — GLUCOSE, CAPILLARY
GLUCOSE-CAPILLARY: 180 mg/dL — AB (ref 65–99)
GLUCOSE-CAPILLARY: 103 mg/dL — AB (ref 65–99)
GLUCOSE-CAPILLARY: 200 mg/dL — AB (ref 65–99)

## 2015-08-29 LAB — BASIC METABOLIC PANEL
Anion gap: 8 (ref 5–15)
BUN: 15 mg/dL (ref 6–20)
CHLORIDE: 98 mmol/L — AB (ref 101–111)
CO2: 25 mmol/L (ref 22–32)
Calcium: 8.6 mg/dL — ABNORMAL LOW (ref 8.9–10.3)
Creatinine, Ser: 1.09 mg/dL — ABNORMAL HIGH (ref 0.44–1.00)
GFR calc Af Amer: >60 mL/min (ref >60)
GFR calc non Af Amer: 60 mL/min — ABNORMAL LOW (ref >60)
GLUCOSE: 139 mg/dL — AB (ref 65–99)
POTASSIUM: 3.9 mmol/L (ref 3.5–5.1)
SODIUM: 131 mmol/L — AB (ref 135–145)

## 2015-08-29 LAB — CBC
HEMATOCRIT: 34.1 % — AB (ref 36.0–46.0)
Hemoglobin: 11.7 g/dL — ABNORMAL LOW (ref 12.0–15.0)
MCH: 30.1 pg (ref 26.0–34.0)
MCHC: 34.3 g/dL (ref 30.0–36.0)
MCV: 87.7 fL (ref 78.0–100.0)
Platelets: 248 10*3/uL (ref 150–400)
RBC: 3.89 MIL/uL (ref 3.87–5.11)
RDW: 12.7 % (ref 11.5–15.5)
WBC: 8 10*3/uL (ref 4.0–10.5)

## 2015-08-29 LAB — SODIUM, URINE, RANDOM: Sodium, Ur: 45 mmol/L

## 2015-08-29 MED ORDER — MAGNESIUM HYDROXIDE 400 MG/5ML PO SUSP: 30.0000 mL | Freq: Every day | ORAL | Status: DC | PRN

## 2015-08-29 MED ORDER — BUSPIRONE HCL 5 MG PO TABS
5.0000 mg | ORAL_TABLET | Freq: Two times a day (BID) | ORAL | Status: DC
  Administered 2015-08-29 – 2015-08-30 (×2): 5 mg via ORAL
  Filled 2015-08-29 (×7): qty 1

## 2015-08-29 MED ORDER — ONDANSETRON 4 MG PO TBDP: 4.0000 mg | ORAL_TABLET | Freq: Three times a day (TID) | ORAL | Status: DC | PRN

## 2015-08-29 MED ORDER — ATENOLOL 25 MG PO TABS
25.0000 mg | ORAL_TABLET | Freq: Every day | ORAL | Status: DC
  Administered 2015-08-30 – 2015-09-01 (×3): 25 mg via ORAL
  Filled 2015-08-29 (×6): qty 1

## 2015-08-29 MED ORDER — ACETAMINOPHEN 325 MG PO TABS: 650.0000 mg | ORAL_TABLET | Freq: Four times a day (QID) | ORAL | Status: DC | PRN

## 2015-08-29 MED ORDER — HYDROXYZINE HCL 25 MG PO TABS
25.0000 mg | ORAL_TABLET | Freq: Four times a day (QID) | ORAL | Status: DC | PRN
Start: 2015-08-29 — End: 2015-09-01
  Administered 2015-08-30 – 2015-08-31 (×2): 25 mg via ORAL
  Filled 2015-08-29 (×2): qty 1

## 2015-08-29 MED ORDER — ALBUTEROL SULFATE (2.5 MG/3ML) 0.083% IN NEBU
2.5000 mg | INHALATION_SOLUTION | Freq: Once | RESPIRATORY_TRACT | Status: AC
  Administered 2015-08-29: 2.5 mg via RESPIRATORY_TRACT
  Filled 2015-08-29: qty 3

## 2015-08-29 MED ORDER — ALUM & MAG HYDROXIDE-SIMETH 200-200-20 MG/5ML PO SUSP: 30.0000 mL | ORAL | Status: DC | PRN

## 2015-08-29 MED ORDER — MAGNESIUM HYDROXIDE 400 MG/5ML PO SUSP
30.0000 mL | Freq: Every day | ORAL | Status: DC | PRN
Start: 2015-08-29 — End: 2015-09-01
  Administered 2015-08-31 – 2015-09-01 (×2): 30 mL via ORAL
  Filled 2015-08-29 (×2): qty 30

## 2015-08-29 MED ORDER — OLANZAPINE 5 MG PO TBDP
5.0000 mg | ORAL_TABLET | Freq: Every day | ORAL | Status: DC
  Administered 2015-08-29 – 2015-08-31 (×3): 5 mg via ORAL
  Filled 2015-08-29 (×7): qty 1

## 2015-08-29 MED ORDER — DOXEPIN HCL 10 MG PO CAPS
10.0000 mg | ORAL_CAPSULE | Freq: Every day | ORAL | Status: DC
  Administered 2015-08-29 – 2015-08-30 (×2): 10 mg via ORAL
  Filled 2015-08-29 (×4): qty 1

## 2015-08-29 MED ORDER — ACETAMINOPHEN 325 MG PO TABS
650.0000 mg | ORAL_TABLET | Freq: Four times a day (QID) | ORAL | Status: DC | PRN
  Administered 2015-08-31: 650 mg via ORAL
  Filled 2015-08-29: qty 2

## 2015-08-29 MED ORDER — GABAPENTIN 300 MG PO CAPS
300.0000 mg | ORAL_CAPSULE | Freq: Every day | ORAL | Status: DC
  Administered 2015-08-30: 300 mg via ORAL
  Filled 2015-08-29 (×4): qty 1

## 2015-08-29 MED ORDER — ALBUTEROL SULFATE HFA 108 (90 BASE) MCG/ACT IN AERS
2.0000 | INHALATION_SPRAY | Freq: Four times a day (QID) | RESPIRATORY_TRACT | Status: DC | PRN
Start: 2015-08-29 — End: 2015-09-01
  Administered 2015-08-30 – 2015-08-31 (×3): 2 via RESPIRATORY_TRACT
  Filled 2015-08-29: qty 6.7

## 2015-08-29 MED ORDER — CLONAZEPAM 0.5 MG PO TABS
0.5000 mg | ORAL_TABLET | Freq: Two times a day (BID) | ORAL | Status: DC | PRN
  Administered 2015-08-30: 0.5 mg via ORAL
  Filled 2015-08-29: qty 1

## 2015-08-29 MED ORDER — ZOLPIDEM TARTRATE 10 MG PO TABS: 10.0000 mg | ORAL_TABLET | Freq: Every evening | ORAL | Status: DC | PRN

## 2015-08-29 MED ORDER — IBUPROFEN 600 MG PO TABS
600.0000 mg | ORAL_TABLET | Freq: Once | ORAL | Status: AC
  Administered 2015-08-29: 600 mg via ORAL
  Filled 2015-08-29: qty 1

## 2015-08-29 MED ORDER — LAMOTRIGINE 25 MG PO TABS
25.0000 mg | ORAL_TABLET | Freq: Every day | ORAL | Status: DC
  Administered 2015-08-30 – 2015-09-01 (×3): 25 mg via ORAL
  Filled 2015-08-29 (×5): qty 1

## 2015-08-29 MED ORDER — IBUPROFEN 600 MG PO TABS
600.0000 mg | ORAL_TABLET | Freq: Three times a day (TID) | ORAL | Status: DC | PRN
Start: 2015-08-29 — End: 2015-09-01
  Administered 2015-08-30 – 2015-09-01 (×5): 600 mg via ORAL
  Filled 2015-08-29 (×5): qty 1

## 2015-08-29 MED ORDER — TRAZODONE HCL 50 MG PO TABS
50.0000 mg | ORAL_TABLET | Freq: Every day | ORAL | Status: DC
  Administered 2015-08-29: 50 mg via ORAL
  Filled 2015-08-29 (×4): qty 1

## 2015-08-29 MED ORDER — METFORMIN HCL 850 MG PO TABS
850.0000 mg | ORAL_TABLET | Freq: Two times a day (BID) | ORAL | Status: DC
  Administered 2015-08-29 – 2015-09-01 (×6): 850 mg via ORAL
  Filled 2015-08-29 (×11): qty 1

## 2015-08-29 NOTE — Tx Team (Signed)
Initial Interdisciplinary Treatment Plan   PATIENT STRESSORS: Loss of boyfriend   PATIENT STRENGTHS: Ability for insight Motivation for treatment/growth Physical Health Supportive family/friends   PROBLEM LIST: Problem List/Patient Goals Date to be addressed Date deferred Reason deferred Estimated date of resolution  "I've been really depressed" 2015-09-16  09-16-2015   D/C  "Still mourning my boyfriends death" 09-16-2015  September 16, 2015   D/C                                             DISCHARGE CRITERIA:  Improved stabilization in mood, thinking, and/or behavior Motivation to continue treatment in a less acute level of care  PRELIMINARY DISCHARGE PLAN: Outpatient therapy Return to previous living arrangement  PATIENT/FAMIILY INVOLVEMENT: This treatment plan has been presented to and reviewed with the patient, [name] [name].  The patient and family have been given the opportunity to ask questions and make suggestions.  Larry Sierras P September 16, 2015, 11:35 PM

## 2015-08-29 NOTE — Progress Notes (Signed)
Nutrition Brief Note  Patient identified on the Malnutrition Screening Tool (MST) Report  Wt Readings from Last 15 Encounters:  08/29/15 188 lb 11.4 oz (85.6 kg)  08/27/15 175 lb (79.379 kg)  07/15/12 196 lb 10.4 oz (89.2 kg)  06/01/12 206 lb 11.2 oz (93.759 kg)  10/26/09 215 lb (97.523 kg)  12/13/08 214 lb (97.07 kg)  11/25/08 218 lb (98.884 kg)  03/04/07 202 lb (91.627 kg)    Body mass index is 32.38 kg/(m^2). Patient meets criteria for obesity based on current BMI.   Pt admitted from [facility] with hyponatremia. MD note indicates hyponatremia related to Hydrochlorothiazide order and that this medication has now been discontinued. Order and summary in for pt to d/c back to [facility].  Current diet order is Regular. Labs and medications reviewed.   No nutrition interventions warranted at this time. If nutrition issues arise, please consult RD.     [name], RD, LDN Inpatient Clinical Dietitian Pager # [phone] After hours/weekend pager # [phone]

## 2015-08-29 NOTE — Clinical Social Work Note (Signed)
Clinical Social Work Assessment  Patient Details  Name: [name] [name] MRN: [id number] Date of Birth: [birth year]  Date of referral:                  Reason for consult:                   Permission sought to share information with:  Customer service manager [facility]) Permission granted to share information::  Yes, Verbal Permission Granted  Name::        Agency::  [place]  Relationship::     Contact Information:     Housing/Transportation Living arrangements for the past 2 months:   ([place]) Source of Information:  Patient Patient Interpreter Needed:  None Criminal Activity/Legal Involvement Pertinent to Current Situation/Hospitalization:  No - Comment as needed Significant Relationships:  Adult Children Lives with:  Adult Children (Pt lives with 23 year old son) Do you feel safe going back to the place where you live?  Yes Need for family participation in patient care:  No (Coment)  Care giving concerns:  None   Facilities manager / plan:  CSW met with the Pt for assessment and transfer back over to [facility]. Pt was accepting when CSW introduced self. Pt stated that she originally came to the hospital because she "just wasn't feeling right." Pt stated she had "strange behavior" and called the ambulance to come get help. Pt was then transferred to [facility]. While Pt was at [facility] she experienced some medical issues. Pt was brought to [facility] to have those concerns evaluated. Pt has been medically cleared to return to [facility]. Pt stated that she has been having a difficult time grieving and CSW provided the Pt with information to [place] for grief counseling after discharged from [facility].  Employment status:  Disabled (Comment on whether or not currently receiving Disability) Insurance information:  Medicaid In [place] PT Recommendations:  Not assessed at this time Information / Referral to community resources:  Inpatient Psychiatric Care (Comment Required) (Pt  came from [facility] and will be returning)  Patient/Family's Response to care:  Pt is pleased about returning to [facility].  Patient/Family's Understanding of and Emotional Response to Diagnosis, Current Treatment, and Prognosis:  Pt is fully aware of her symptomology and that she will need to complete her treatment.   Emotional Assessment Appearance:  Appears stated age Attitude/Demeanor/Rapport:    Affect (typically observed):  Accepting, Pleasant Orientation:  Oriented to Self, Oriented to Place, Oriented to  Time, Oriented to Situation Alcohol / Substance use:  Tobacco Use (Pt smokes one pack a day) Psych involvement (Current and /or in the community):     Discharge Needs  Concerns to be addressed:  Mental Health Concerns (Returning to [facility]) Readmission within the last 30 days:  Yes Current discharge risk:  Psychiatric Illness Barriers to Discharge:  No Barriers Identified   Pete Pelt 08/29/2015, 9:51 PM

## 2015-08-29 NOTE — Progress Notes (Signed)
Admission Note:  D-45 yr female readmitted to [facility] after being discharged from [facility] yesterday to be medically admitted to [facility].  Patient appears in no acute distress. Patient verbalizes that she has been battling depression since her boyfriend died 1 1/2 years ago. Patient reports that she often has crying spells but feels "much better" today. Patient appears depressed with a brighter affect than on previous shifts.  Patient was calm and cooperative during admission. Patient mildly confused during admission process and was slow to respond. Patient denies having any hx of AVH . Patient currently denies SI/HI. Patient contracts for safety. A- Skin was assessed and found to be clear of any abnormal marks. Patient searched and no contraband found, POC and unit policies reexplained and understanding verbalized. Consents obtained. Food and fluids offered and accepted. R- Patient had no additional questions or concerns.

## 2015-08-29 NOTE — Discharge Summary (Signed)
Physician Discharge Summary  [name] [name] VXB:[id number] DOB: 08-29-1970 DOA: 08/28/2015  PCP: PROVIDER NOT IN SYSTEM  Admit date: 08/28/2015 Discharge date: 08/29/2015  Time spent: 35 minutes  Recommendations for Outpatient Follow-up:  1. Follow up on BMP on hospital follow-up visit, had hyponatremia with sodium of 128 on 08/28/2015 likely secondary to combination of hydrochlorothiazide, dehydration, psych medications, improved to 131 on 08/29/2015 2. Follow-up on blood pressures as hydrochlorothiazide was discontinued   Discharge Diagnoses:  Active Problems:   Hyponatremia   Discharge Condition: Stable  Diet recommendation: Regular diet  Filed Weights   08/29/15 0716  Weight: 85.6 kg (188 lb 11.4 oz)    History of present illness:  [name] [name] is a 45 y.o. female the past medical history bipolar disorder, major depression, anxiety, presented as a transfer from behavioral health for hyponatremia. She was initially admitted to behavioral health for anxiety, depression, stating "I don't want to live." Medicine consulted for hyponatremia. On 08/24/2015 she had a sodium 125 which gradually trended up to 128 on 08/26/2015. She had been on hydrochlorothiazide and trazodone which may have contributed to hyponatremia. She reports ongoing feelings of depression, anxiety, feelings of hopelessness and tearfulness on occasion. She does not want to harm herself or others.    Hospital Course:  [name] [name] is a pleasant 45 year old female with a history bipolar disorder, major depression, admitted to medicine service on 08/28/2015 when she presented with hyponatremia. Labs had shown sodium of 128 on 08/26/2015, however gradually improved from 125 on 08/24/2015. Hyponatremia thought to be multifactorial in nature with 100 chlorothiazide, dehydration, psych medications all likely contributors. She was given IV fluid resuscitation overnight with normal  saline as a sodium improved to 131 by the following morning. She reported feeling better and well enough to go back to behavior health. Would recommend followed up on a BMP on her hospital follow-up visit with her primary care physician. Hydrochlorothiazide was discontinued on discharge.   Discharge Exam: Filed Vitals:   08/28/15 2046 08/29/15 0512  BP: 119/74 98/67  Pulse: 89 81  Temp: 98.7 F (37.1 C) 98 F (36.7 C)  Resp: 16 18    General: Nontoxic appearing, awake and alert following commands looks better Cardiovascular: Regular rate and rhythm normal S1-S2 no murmurs rubs gallops Respiratory: Having a few expiratory wheezes otherwise normal respiratory effort, breathing comfortably in room air Abdomen: Soft nontender nondistended  Discharge Instructions   Discharge Instructions    Call MD for:  difficulty breathing, headache or visual disturbances    Complete by:  As directed      Call MD for:  extreme fatigue    Complete by:  As directed      Call MD for:  hives    Complete by:  As directed      Call MD for:  persistant dizziness or light-headedness    Complete by:  As directed      Call MD for:  persistant nausea and vomiting    Complete by:  As directed      Call MD for:  redness, tenderness, or signs of infection (pain, swelling, redness, odor or green/yellow discharge around incision site)    Complete by:  As directed      Call MD for:  severe uncontrolled pain    Complete by:  As directed      Call MD for:  temperature >100.4    Complete by:  As directed      Call MD for:  Complete by:  As directed      Diet - low sodium heart healthy    Complete by:  As directed      Increase activity slowly    Complete by:  As directed           Current Discharge Medication List    START taking these medications   Details  ondansetron (ZOFRAN ODT) 4 MG disintegrating tablet Take 1 tablet (4 mg total) by mouth every 8 (eight) hours as needed for nausea or  vomiting. Qty: 20 tablet, Refills: 0      CONTINUE these medications which have NOT CHANGED   Details  albuterol (PROVENTIL HFA;VENTOLIN HFA) 108 (90 Base) MCG/ACT inhaler Inhale 2 puffs into the lungs every 6 (six) hours as needed for wheezing. Qty: 1 Inhaler, Refills: 2    atenolol (TENORMIN) 25 MG tablet Take 25 mg by mouth daily. Refills: 4    busPIRone (BUSPAR) 5 MG tablet Take 1 tablet (5 mg total) by mouth 2 (two) times daily. For anxiety    clonazePAM (KLONOPIN) 0.5 MG tablet Take 1 tablet (0.5 mg total) by mouth 2 (two) times daily as needed (anxiety). Qty: 30 tablet, Refills: 0    doxepin (SINEQUAN) 10 MG capsule Take 1 capsule (10 mg total) by mouth at bedtime. For anxiety/insomnia    gabapentin (NEURONTIN) 300 MG capsule TAKE 1 CAPSULE BY MOUTH DAILY Qty: 90 capsule, Refills: 0    hydrOXYzine (ATARAX/VISTARIL) 25 MG tablet Take 1 tablet (25 mg total) by mouth every 6 (six) hours as needed for anxiety. Qty: 12 tablet, Refills: 0    ibuprofen (ADVIL,MOTRIN) 600 MG tablet Take 1 tablet (600 mg total) by mouth every 8 (eight) hours as needed (Temp > 38.3Celsius). For pain/fever Qty: 1 tablet, Refills: 0    lamoTRIgine (LAMICTAL) 25 MG tablet Take 1 tablet (25 mg total) by mouth daily. For mood stabilization    metFORMIN (GLUCOPHAGE) 850 MG tablet Take 1 tablet (850 mg total) by mouth 2 (two) times daily. For diabetes management Refills: 5    nicotine polacrilex (NICORETTE) 2 MG gum Take 1 each (2 mg total) by mouth as needed for smoking cessation (Q 2 H while awake.). Qty: 100 tablet, Refills: 0    OLANZapine zydis (ZYPREXA) 5 MG disintegrating tablet Take 1 tablet (5 mg total) by mouth at bedtime. For mood control    traZODone (DESYREL) 50 MG tablet Take 50 mg by mouth at bedtime. Refills: 4    zolpidem (AMBIEN) 10 MG tablet Take 10 mg by mouth at bedtime as needed for sleep.  Refills: 0      STOP taking these medications     glucose blood (ONE TOUCH ULTRA  TEST) test strip      glucose monitoring kit (FREESTYLE) monitoring kit      lisinopril-hydrochlorothiazide (PRINZIDE,ZESTORETIC) 10-12.5 MG tablet      promethazine (PHENERGAN) 25 MG tablet        Allergies  Allergen Reactions  . Tramadol Nausea Only    "feeling like I'm on fire"  . Nicotine     Patient states she is allergic to the Nicotine patch and nicotine gum and they cause her to break out in a rash.  She states she can smoke cigarettes.      The results of significant diagnostics from this hospitalization (including imaging, microbiology, ancillary and laboratory) are listed below for reference.    Significant Diagnostic Studies: No results found.  Microbiology: No results found for this or any  previous visit (from the past 240 hour(s)).   Labs: Basic Metabolic Panel:  Recent Labs Lab 08/25/15 1656 08/25/15 2229 08/26/15 0635 08/28/15 1903 08/29/15 0609  NA 126* 129* 128* 128* 131*  K 3.8 4.3 3.9 3.3* 3.9  CL 91* 95* 99* 94* 98*  CO2 _0 GLUCOSE 158* 124* 165* 102* 139*  BUN _1 CREATININE 1.04* 1.18* 0.94 0.96 1.09*  CALCIUM 8.8* 9.0 8.4* 8.8* 8.6*   Liver Function Tests: No results for input(s): AST, ALT, ALKPHOS, BILITOT, PROT, ALBUMIN in the last 168 hours. No results for input(s): LIPASE, AMYLASE in the last 168 hours. No results for input(s): AMMONIA in the last 168 hours. CBC:  Recent Labs Lab 08/24/15 2153 08/28/15 1903 08/29/15 0609  WBC 12.5* 9.7 8.0  NEUTROABS 7.4  --   --   HGB 12.9 11.5* 11.7*  HCT 35.7* 32.2* 34.1*  MCV 83.6 83.4 87.7  PLT 271 235 248   Cardiac Enzymes: No results for input(s): CKTOTAL, CKMB, CKMBINDEX, TROPONINI in the last 168 hours. BNP: BNP (last 3 results) No results for input(s): BNP in the last 8760 hours.  ProBNP (last 3 results) No results for input(s): PROBNP in the last 8760 hours.  CBG:  Recent Labs Lab 08/27/15 1717 08/28/15 0635 08/28/15 1706 08/28/15 2256  08/29/15 0751  GLUCAP 156* 194* 153* 109* 180*       Signed:  Kelvin Cellar MD.  [facility] 08/29/2015, 10:39 AM

## 2015-08-30 ENCOUNTER — Encounter (HOSPITAL_COMMUNITY): Payer: Self-pay | Admitting: Psychiatry

## 2015-08-30 DIAGNOSIS — F3130 Bipolar disorder, current episode depressed, mild or moderate severity, unspecified: Secondary | ICD-10-CM

## 2015-08-30 DIAGNOSIS — F17200 Nicotine dependence, unspecified, uncomplicated: Secondary | ICD-10-CM

## 2015-08-30 LAB — PREGNANCY, URINE: PREG TEST UR: NEGATIVE

## 2015-08-30 LAB — GLUCOSE, CAPILLARY
GLUCOSE-CAPILLARY: 126 mg/dL — AB (ref 65–99)
Glucose-Capillary: 126 mg/dL — ABNORMAL HIGH (ref 65–99)
Glucose-Capillary: 143 mg/dL — ABNORMAL HIGH (ref 65–99)

## 2015-08-30 MED ORDER — INSULIN ASPART 100 UNIT/ML ~~LOC~~ SOLN: 0.0000 [IU] | Freq: Every day | SUBCUTANEOUS | Status: DC

## 2015-08-30 MED ORDER — INSULIN ASPART 100 UNIT/ML ~~LOC~~ SOLN
0.0000 [IU] | Freq: Three times a day (TID) | SUBCUTANEOUS | Status: DC
  Administered 2015-08-30 (×2): 2 [IU] via SUBCUTANEOUS
  Administered 2015-08-31: 3 [IU] via SUBCUTANEOUS
  Administered 2015-08-31: 2 [IU] via SUBCUTANEOUS
  Administered 2015-08-31: 0 [IU] via SUBCUTANEOUS
  Administered 2015-09-01: 2 [IU] via SUBCUTANEOUS
  Administered 2015-09-01: 0 [IU] via SUBCUTANEOUS

## 2015-08-30 MED ORDER — PANTOPRAZOLE SODIUM 40 MG PO TBEC
40.0000 mg | DELAYED_RELEASE_TABLET | Freq: Two times a day (BID) | ORAL | Status: DC
  Administered 2015-08-30 – 2015-09-01 (×4): 40 mg via ORAL
  Filled 2015-08-30 (×8): qty 1

## 2015-08-30 MED ORDER — GABAPENTIN 300 MG PO CAPS
300.0000 mg | ORAL_CAPSULE | Freq: Three times a day (TID) | ORAL | Status: DC
  Administered 2015-08-30 – 2015-08-31 (×4): 300 mg via ORAL
  Filled 2015-08-30 (×9): qty 1

## 2015-08-30 NOTE — Progress Notes (Signed)
NUTRITION ASSESSMENT  Pt identified as at risk on the Malnutrition Screen Tool  INTERVENTION: 1. Educated patient on the importance of nutrition and encouraged intake of food and beverages. 2. Discussed weight goals. 3. Supplements: none at this time.  NUTRITION DIAGNOSIS: No nutrition dx at this time.  Goal: Pt to meet >/= 90% of their estimated nutrition needs.  Monitor:  PO intake  Assessment:  Pt seen for  MST after transfer from [facility] to [facility] where pt ate 100% of meals yesterday (3/21). Per review, pt with good appetite and had some recent weight fluctuation and was previously on Hydrochlorothiazide which is now discontinued. No nutrition-related needs at this time.  45 y.o. female  Height: Ht Readings from Last 1 Encounters:  08/29/15 5\' 4"  (1.626 m)    Weight: Wt Readings from Last 1 Encounters:  08/29/15 188 lb 11.4 oz (85.6 kg)    Weight Hx: Wt Readings from Last 10 Encounters:  08/29/15 188 lb 11.4 oz (85.6 kg)  08/29/15 188 lb 11.4 oz (85.6 kg)  08/27/15 175 lb (79.379 kg)  07/15/12 196 lb 10.4 oz (89.2 kg)  06/01/12 206 lb 11.2 oz (93.759 kg)  10/26/09 215 lb (97.523 kg)  12/13/08 214 lb (97.07 kg)  11/25/08 218 lb (98.884 kg)  03/04/07 202 lb (91.627 kg)    BMI:  Body mass index is 32.38 kg/(m^2). Pt meets criteria for obesity based on current BMI.  Estimated Nutritional Needs: Kcal: 25-30 kcal/kg Protein: > 1 gram protein/kg Fluid: 1 ml/kcal  Diet Order: Diet Carb Modified Fluid consistency:: Thin; Room service appropriate?: Yes Pt is also offered choice of unit snacks mid-morning and mid-afternoon.  Pt is eating as desired.   Lab results and medications reviewed.      [name], RD, LDN Inpatient Clinical Dietitian Pager # [phone] After hours/weekend pager # [phone]

## 2015-08-30 NOTE — Progress Notes (Signed)
DAR NOTE: Patient presents with flat affect and depressed mood.  Denies auditory and visual hallucinations.  Rates depression at 7, hopelessness at 5, and anxiety at 4.  Maintained on routine safety checks.  Medications given as prescribed.  Support and encouragement offered as needed.  Attended group and participated.  States goal for today is "happiness."  Minimal interaction with staff and peers.  No signs of hypoglycemic reaction noted.

## 2015-08-30 NOTE — [facility] Group Notes (Signed)
[facility] LCSW Group Therapy  08/30/2015 2:05 PM  Type of Therapy: Group Therapy  Participation Level: Invited. Chose not to attend.  Summary of Progress/Problems: Onalee Hua from [employer] was here to tell his story of recovery and play his guitar.   Vito Backers. Beverely Pace 08/30/2015 2:05 PM

## 2015-08-30 NOTE — BHH Suicide Risk Assessment (Signed)
[facility] Admission Suicide Risk Assessment   Nursing information obtained from:  Patient Demographic factors:  Caucasian Current Mental Status:  NA Loss Factors:  Loss of significant relationship Historical Factors:  NA Risk Reduction Factors:  Sense of responsibility to family, Positive social support  Total Time spent with patient: 30 minutes Principal Problem: Bipolar I disorder, current episode depressed (HCC) Diagnosis:   Patient Active Problem List   Diagnosis Date Noted  . Bipolar I disorder, current episode depressed (HCC) [F31.30] 08/28/2015  . Tobacco use disorder [F17.200] 08/28/2015  . HTN (hypertension) [I10] 08/28/2015  . Diabetes (HCC) [E11.9] 08/28/2015  . Hyponatremia [E87.1] 08/28/2015  . DM [E11.9] 10/26/2009  . OBESITY [E66.9] 10/26/2009  . ESSENTIAL HYPERTENSION [I10] 10/26/2009  . DISTURBANCE OF SKIN SENSATION [R20.9] 10/26/2009  . MIGRAINE HEADACHE [G43.909] 03/05/2007   Subjective Data: Please see H&P.   Continued Clinical Symptoms:  Alcohol Use Disorder Identification Test Final Score (AUDIT): 0 The "Alcohol Use Disorders Identification Test", Guidelines for Use in Primary Care, Second Edition.  World Science writer [facility]). Score between 0-7:  no or low risk or alcohol related problems. Score between 8-15:  moderate risk of alcohol related problems. Score between 16-19:  high risk of alcohol related problems. Score 20 or above:  warrants further diagnostic evaluation for alcohol dependence and treatment.   CLINICAL FACTORS:   Previous Psychiatric Diagnoses and Treatments Medical Diagnoses and Treatments/Surgeries   Musculoskeletal: Strength & Muscle Tone: within normal limits Gait & Station: normal Patient leans: N/A  Psychiatric Specialty Exam: Review of Systems  Musculoskeletal: Positive for back pain.  Psychiatric/Behavioral: Positive for depression. The patient is nervous/anxious.   All other systems reviewed and are negative.   Blood  pressure 115/74, pulse 96, temperature 98 F (36.7 C), temperature source Oral, resp. rate 20, height 5\' 4"  (1.626 m), weight 85.6 kg (188 lb 11.4 oz), SpO2 100 %.Body mass index is 32.38 kg/(m^2).                              Please see H&P.                           COGNITIVE FEATURES THAT CONTRIBUTE TO RISK:  Closed-mindedness, Polarized thinking and Thought constriction (tunnel vision)    SUICIDE RISK:   Mild:  Suicidal ideation of limited frequency, intensity, duration, and specificity.  There are no identifiable plans, no associated intent, mild dysphoria and related symptoms, good self-control (both objective and subjective assessment), few other risk factors, and identifiable protective factors, including available and accessible social support.  PLAN OF CARE: Please see H&P.   I certify that inpatient services furnished can reasonably be expected to improve the patient's condition.   Kregg Cihlar, MD 08/30/2015, 11:32 AM
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
## 2015-08-30 NOTE — Tx Team (Signed)
Interdisciplinary Treatment Plan Update (Adult)  Date:  08/30/2015   Time Reviewed:  3:54 PM   Progress in Treatment: Attending groups: Yes. Participating in groups:  Yes. Taking medication as prescribed:  Yes. Tolerating medication:  Yes. Family/Significant other contact made:  No Patient understands diagnosis:  Yes  As evidenced by seeking help with "confusion and anxiety" Discussing patient identified problems/goals with staff:  Yes, see initial care plan. Medical problems stabilized or resolved:  Yes. Denies suicidal/homicidal ideation: Yes. Issues/concerns per patient self-inventory:  No. Other:  New problem(s) identified:  Discharge Plan or Barriers: see below  Reason for Continuation of Hospitalization: Anxiety Depression Medication stabilization Other; describe confusion  Comments:  [name] [name] is a 45 y.o.caucasian female , who has a hx of bipolar disorder, who presented to [facility] reporting increasing anxiety and depression. Pt was admitted to [facility] on 08/28/15 , but was transferred to hospitalist service the same day for management of hyponatremia . Pt was medically stabilized and transferred back [facility] to continue psychiatric management .Pt continues to be depressed, labile. Will continue Zyprexa 5 mg po qhs for mood sx. Will continue Lamictal 25 mg po daily for mood lability. Will continue Doxepin 10 mg po qhs for sleep. Will increase Gabapentin to 300 mg po tid for anxiety/pain. Will also offer lidocaine patch for pain. Will add Klonopin 0.5 mg po bid prn for anxiety sx. Will add PRN medications as per agitation protocol.  Estimated length of stay:  New goal(s):  Review of initial/current patient goals per problem list:   Review of initial/current patient goals per problem list:  1. Goal(s): Patient will participate in aftercare plan   Met: Yes   Target date: 3-5 days post admission date   As evidenced by: Patient will participate within  aftercare plan AEB aftercare provider and housing plan at discharge being identified. 08/30/15:  Return home, follow up outpt    2. Goal (s): Patient will exhibit decreased depressive symptoms and suicidal ideations.   Met: No   Target date: 3-5 days post admission date   As evidenced by: Patient will utilize self rating of depression at 3 or below and demonstrate decreased signs of depression or be deemed stable for discharge by MD. 08/30/15:  Rates her depression a 5 today    3. Goal(s): Patient will demonstrate decreased signs and symptoms of anxiety.   Met: No   Target date: 3-5 days post admission date   As evidenced by: Patient will utilize self rating of anxiety at 3 or below and demonstrated decreased signs of anxiety, or be deemed stable for discharge by MD 08/30/15:  Rates her anxiety a 7 today      5. Goal(s): Patient will demonstrate decreased signs of psychosis  * Met: No  * Target date: 3-5 days post admission date  * As evidenced by: Patient will demonstrate decreased frequency of AVH or return to baseline function 08/30/15:  Pt c/o confusion          Attendees: Patient:  08/30/2015 3:54 PM   Family:   08/30/2015 3:54 PM   Physician:  Ursula Alert, MD 08/30/2015 3:54 PM   Nursing:   Phillis Haggis, RN 08/30/2015 3:54 PM   CSW:    Roque Lias, LCSW   08/30/2015 3:54 PM   Other:  08/30/2015 3:54 PM   Other:   08/30/2015 3:54 PM   Other:  Lars Pinks, Nurse CM 08/30/2015 3:54 PM   Other:   08/30/2015 3:54 PM  OtherNorberto Sorenson, [facility]  08/30/2015 3:54 PM   Other:  08/30/2015 3:54 PM   Other:  08/30/2015 3:54 PM   Other:  08/30/2015 3:54 PM   Other:  08/30/2015 3:54 PM   Other:  08/30/2015 3:54 PM   Other:   08/30/2015 3:54 PM    Scribe for Treatment Team:   Trish Mage, 08/30/2015 3:54 PM
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
## 2015-08-31 DIAGNOSIS — F3112 Bipolar disorder, current episode manic without psychotic features, moderate: Secondary | ICD-10-CM | POA: Diagnosis present

## 2015-08-31 DIAGNOSIS — F314 Bipolar disorder, current episode depressed, severe, without psychotic features: Secondary | ICD-10-CM | POA: Diagnosis present

## 2015-08-31 DIAGNOSIS — F3132 Bipolar disorder, current episode depressed, moderate: Secondary | ICD-10-CM | POA: Diagnosis present

## 2015-08-31 LAB — LIPID PANEL
CHOLESTEROL: 151 mg/dL (ref 0–200)
HDL: 50 mg/dL (ref >40)
LDL CALC: 83 mg/dL (ref 0–99)
TRIGLYCERIDES: 89 mg/dL (ref <150)
Total CHOL/HDL Ratio: 3 RATIO
VLDL: 18 mg/dL (ref 0–40)

## 2015-08-31 LAB — GLUCOSE, CAPILLARY
GLUCOSE-CAPILLARY: 126 mg/dL — AB (ref 65–99)
Glucose-Capillary: 89 mg/dL (ref 65–99)
Glucose-Capillary: 151 mg/dL — ABNORMAL HIGH (ref 65–99)
Glucose-Capillary: 117 mg/dL — ABNORMAL HIGH (ref 65–99)

## 2015-08-31 LAB — TSH: TSH: 0.56 u[IU]/mL (ref 0.350–4.500)

## 2015-08-31 MED ORDER — LIDOCAINE 5 % EX PTCH
1.0000 | MEDICATED_PATCH | CUTANEOUS | Status: DC
  Administered 2015-08-31 – 2015-09-01 (×2): 1 via TRANSDERMAL
  Filled 2015-08-31 (×4): qty 1

## 2015-08-31 MED ORDER — GABAPENTIN 400 MG PO CAPS
400.0000 mg | ORAL_CAPSULE | Freq: Three times a day (TID) | ORAL | Status: DC
  Administered 2015-08-31 – 2015-09-01 (×3): 400 mg via ORAL
  Filled 2015-08-31 (×8): qty 1

## 2015-08-31 MED ORDER — CLONAZEPAM 0.5 MG PO TABS
0.5000 mg | ORAL_TABLET | Freq: Every day | ORAL | Status: DC | PRN
  Administered 2015-08-31: 0.5 mg via ORAL
  Filled 2015-08-31: qty 1

## 2015-08-31 MED ORDER — MUSCLE RUB 10-15 % EX CREA
TOPICAL_CREAM | CUTANEOUS | Status: DC | PRN
  Filled 2015-08-31: qty 85

## 2015-08-31 MED ORDER — DOXEPIN HCL 10 MG PO CAPS
20.0000 mg | ORAL_CAPSULE | Freq: Every day | ORAL | Status: DC
  Administered 2015-08-31: 20 mg via ORAL
  Filled 2015-08-31 (×4): qty 2

## 2015-08-31 NOTE — BHH Group Notes (Signed)
BHH Group Notes:  (Nursing/MHT/Case Management/Adjunct)  Date:  08/31/2015  Time:  3:03 PM  Type of Therapy:  Nurse Education  Participation Level:  Active  Participation Quality:  Appropriate and Attentive  Affect:  Appropriate  Cognitive:  Alert and Appropriate  Insight:  Appropriate and Good  Engagement in Group:  Engaged and Improving  Modes of Intervention:  Discussion and Education    Summary of Progress/Problems: Topic was on leisure and lifestyle changes. Discussed the importance of choosing a healthy leisure activities. Group encouraged to surround themselves with positive and healthy group/support system when changing to a healthy lifestyle. Patient was receptive and contributed.    Mickie Bail 08/31/2015, 3:03 PM

## 2015-08-31 NOTE — Progress Notes (Signed)
D: Pt presents flat in affect and depressed. Anxiety inferred. Pt denies any SI/HI/AVH. Pt actively participates within the milieu. Pt observed interacting with her peers appropriately within the milieu. Pt is complaint with her POC.  A: Writer administered scheduled and prn medications to pt, per MD orders. Continued support and availability as needed was extended to this pt. Staff continues to monitor pt with q90min checks.  R: No adverse drug reactions noted. Pt receptive to treatment. Pt remains safe at this time.

## 2015-08-31 NOTE — Progress Notes (Signed)
[facility] MD Progress Note  08/31/2015 2:25 PM [name] [name]  MRN:  [id number] Subjective: Patient states " I am having trouble sleeping , there was a lot of noise in my room last night and I my room stinks."  Objective:[name] [name] is a 45 y.o.caucasian female , who has a hx of bipolar disorder, who presented to [facility] reporting increasing anxiety and depression.Pt was found to have hyponatremia and was transferred to the medical service for further management. Pt was medically stabilized and was transferred back to [facility].  Patient seen and chart reviewed.Discussed patient with treatment team.  Pt today presents as anxious , restless, reports sleep as disrupted last night. Pt per staff continues to have multiple somatic complaints , is medication seeking , making use of PRN medications for anxiety very often. Pt continues to need a lot of encouragement and support.      Principal Problem: Bipolar disorder with moderate depression (HCC) Diagnosis:   Patient Active Problem List   Diagnosis Date Noted  . Bipolar disorder with moderate depression (HCC) [F31.32] 08/31/2015  . Tobacco use disorder [F17.200] 08/28/2015  . HTN (hypertension) [I10] 08/28/2015  . Diabetes (HCC) [E11.9] 08/28/2015  . Hyponatremia [E87.1] 08/28/2015  . DM [E11.9] 10/26/2009  . OBESITY [E66.9] 10/26/2009  . ESSENTIAL HYPERTENSION [I10] 10/26/2009  . DISTURBANCE OF SKIN SENSATION [R20.9] 10/26/2009  . MIGRAINE HEADACHE [G43.909] 03/05/2007   Total Time spent with patient: 30 minutes  Past Psychiatric History: Pt has a hx of Bipolar disorder, has been admitted at [facility] in 2001, 2008,2011,2012. Pt per EHR had been on several psychotropic medications in the past. Denies suicide attempts.  Past Medical History:  Past Medical History  Diagnosis Date  . Diabetes mellitus   . Asthma   . Anxiety disorder   . Bipolar affective disorder (HCC)   . Depression   . Migraine     Past Surgical History  Procedure  Laterality Date  . Knee arthroscopy      x 2  . Tubal ligation    . Knee surgery Left     Incision made and knee cleaned out   Family History:  Family History  Problem Relation Age of Onset  . Bipolar disorder Mother   . Hypertension Father   . Diabetes Father    Family Psychiatric  History: Pt reports that mother had hx of bipolar do. Social History: Pt is separated , her husband in in jail, she has been in other relationships, she is currently not in one, lives with her 23 yr old son in [place], is on SSI, has two other adult children, her 49 y old son [name] [name] is supportive.  History  Alcohol Use  . Yes    Comment: occasionally     History  Drug Use No    Social History   Social History  . Marital Status: Divorced    Spouse Name: N/A  . Number of Children: N/A  . Years of Education: N/A   Social History Main Topics  . Smoking status: Current Every Day Smoker -- 1.00 packs/day for 29 years    Types: Cigarettes  . Smokeless tobacco: Never Used  . Alcohol Use: Yes     Comment: occasionally  . Drug Use: No  . Sexual Activity: Yes    Birth Control/ Protection: Surgical   Other Topics Concern  . None   Social History Narrative   Patient lives in [place] with her 2 kids- 51 in 39 years old.   She  has total 4 kids.   She is a single mom.         Additional Social History:                         Sleep: Poor  Appetite:  Fair  Current Medications: Current Facility-Administered Medications  Medication Dose Route Frequency Provider Last Rate Last Dose  . acetaminophen (TYLENOL) tablet 650 mg  650 mg Oral Q6H PRN Oneta Rack, NP   650 mg at 08/31/15 0640  . albuterol (PROVENTIL HFA;VENTOLIN HFA) 108 (90 Base) MCG/ACT inhaler 2 puff  2 puff Inhalation Q6H PRN Kerry Hough, PA-C   2 puff at 08/31/15 0831  . alum & mag hydroxide-simeth (MAALOX/MYLANTA) 200-200-20 MG/5ML suspension 30 mL  30 mL Oral Q4H PRN Oneta Rack, NP      . atenolol (TENORMIN)  tablet 25 mg  25 mg Oral Daily Kerry Hough, PA-C   25 mg at 08/31/15 0825  . clonazePAM (KLONOPIN) tablet 0.5 mg  0.5 mg Oral Daily PRN Jomarie Longs, MD      . doxepin (SINEQUAN) capsule 20 mg  20 mg Oral QHS Pricilla Moehle, MD      . gabapentin (NEURONTIN) capsule 400 mg  400 mg Oral TID Jomarie Longs, MD      . hydrOXYzine (ATARAX/VISTARIL) tablet 25 mg  25 mg Oral Q6H PRN Kerry Hough, PA-C   25 mg at 08/30/15 1630  . ibuprofen (ADVIL,MOTRIN) tablet 600 mg  600 mg Oral Q8H PRN Kerry Hough, PA-C   600 mg at 08/31/15 1312  . insulin aspart (novoLOG) injection 0-15 Units  0-15 Units Subcutaneous TID WC Jomarie Longs, MD   0 Units at 08/31/15 1200  . insulin aspart (novoLOG) injection 0-5 Units  0-5 Units Subcutaneous QHS Jomarie Longs, MD   0 Units at 08/30/15 2200  . lamoTRIgine (LAMICTAL) tablet 25 mg  25 mg Oral Daily Kerry Hough, PA-C   25 mg at 08/31/15 0825  . lidocaine (LIDODERM) 5 % 1 patch  1 patch Transdermal Q24H Jomarie Longs, MD   1 patch at 08/31/15 1113  . magnesium hydroxide (MILK OF MAGNESIA) suspension 30 mL  30 mL Oral Daily PRN Oneta Rack, NP      . metFORMIN (GLUCOPHAGE) tablet 850 mg  850 mg Oral BID Kerry Hough, PA-C   850 mg at 08/31/15 0825  . MUSCLE RUB CREA   Topical PRN Jomarie Longs, MD      . OLANZapine zydis (ZYPREXA) disintegrating tablet 5 mg  5 mg Oral QHS Kerry Hough, PA-C   5 mg at 08/30/15 2143  . pantoprazole (PROTONIX) EC tablet 40 mg  40 mg Oral BID AC Jomarie Longs, MD   40 mg at 08/31/15 6553    Lab Results:  Results for orders placed or performed during the hospital encounter of 08/29/15 (from the past 48 hour(s))  Glucose, capillary     Status: Abnormal   Collection Time: 08/30/15 12:08 PM  Result Value Ref Range   Glucose-Capillary 126 (H) 65 - 99 mg/dL   Comment 1 Notify RN    Comment 2 Document in Chart   Pregnancy, urine     Status: None   Collection Time: 08/30/15  1:16 PM  Result Value Ref Range   Preg  Test, Ur NEGATIVE NEGATIVE    Comment:        THE SENSITIVITY OF THIS METHODOLOGY IS >20 mIU/mL.  Performed at [facility]   Glucose, capillary     Status: Abnormal   Collection Time: 08/30/15  5:09 PM  Result Value Ref Range   Glucose-Capillary 143 (H) 65 - 99 mg/dL   Comment 1 Notify RN    Comment 2 Document in Chart   Glucose, capillary     Status: Abnormal   Collection Time: 08/30/15  9:37 PM  Result Value Ref Range   Glucose-Capillary 126 (H) 65 - 99 mg/dL  Glucose, capillary     Status: Abnormal   Collection Time: 08/31/15  6:19 AM  Result Value Ref Range   Glucose-Capillary 126 (H) 65 - 99 mg/dL   Comment 1 Notify RN   TSH     Status: None   Collection Time: 08/31/15  6:29 AM  Result Value Ref Range   TSH 0.560 0.350 - 4.500 uIU/mL    Comment: Performed at [facility]  Lipid panel     Status: None   Collection Time: 08/31/15  6:29 AM  Result Value Ref Range   Cholesterol 151 0 - 200 mg/dL   Triglycerides 89 <098 mg/dL   HDL 50 >11 mg/dL   Total CHOL/HDL Ratio 3.0 RATIO   VLDL 18 0 - 40 mg/dL   LDL Cholesterol 83 0 - 99 mg/dL    Comment:        Total Cholesterol/HDL:CHD Risk Coronary Heart Disease Risk Table                     Men   Women  1/2 Average Risk   3.4   3.3  Average Risk       5.0   4.4  2 X Average Risk   9.6   7.1  3 X Average Risk  23.4   11.0        Use the calculated Patient Ratio above and the CHD Risk Table to determine the patient's CHD Risk.        ATP III CLASSIFICATION (LDL):  <100     mg/dL   Optimal  914-782  mg/dL   Near or Above                    Optimal  130-159  mg/dL   Borderline  956-213  mg/dL   High  >086     mg/dL   Very High Performed at [facility]   Glucose, capillary     Status: None   Collection Time: 08/31/15 11:33 AM  Result Value Ref Range   Glucose-Capillary 89 65 - 99 mg/dL    Blood Alcohol level:  Lab Results  Component Value Date   ETH <5 08/24/2015    ETH <11 04/29/2014    Physical Findings: AIMS: Facial and Oral Movements Muscles of Facial Expression: None, normal Lips and Perioral Area: None, normal Jaw: None, normal Tongue: None, normal,Extremity Movements Upper (arms, wrists, hands, fingers): None, normal Lower (legs, knees, ankles, toes): None, normal, Trunk Movements Neck, shoulders, hips: None, normal, Overall Severity Severity of abnormal movements (highest score from questions above): None, normal Incapacitation due to abnormal movements: None, normal Patient's awareness of abnormal movements (rate only patient's report): No Awareness, Dental Status Current problems with teeth and/or dentures?: No Does patient usually wear dentures?: No  CIWA:    COWS:     Musculoskeletal: Strength & Muscle Tone: within normal limits Gait & Station: normal Patient leans: N/A  Psychiatric Specialty Exam: Review of Systems  Musculoskeletal: Positive for back pain.  Psychiatric/Behavioral: The patient is nervous/anxious and has insomnia.   All other systems reviewed and are negative.   Blood pressure 102/53, pulse 82, temperature 98.8 F (37.1 C), temperature source Oral, resp. rate 20, height 5\' 4"  (1.626 m), weight 85.6 kg (188 lb 11.4 oz), SpO2 100 %.Body mass index is 32.38 kg/(m^2).  General Appearance: Fairly Groomed  Patent attorney::  Fair  Speech:  Slow  Volume:  Decreased  Mood:  Anxious  Affect:  Congruent  Thought Process:  Goal Directed  Orientation:  Full (Time, Place, and Person)  Thought Content:  Rumination  Suicidal Thoughts:  No  Homicidal Thoughts:  No  Memory:  Immediate;   Fair Recent;   Fair Remote;   Fair  Judgement:  Impaired  Insight:  Shallow  Psychomotor Activity:  Restlessness  Concentration:  Poor  Recall:  [employer] of Knowledge:Fair  Language: Fair  Akathisia:  No  Handed:  Right  AIMS (if indicated):     Assets:  Desire for Improvement  ADL's:  Intact  Cognition: WNL  Sleep:  Number of  Hours: 5   Treatment Plan Summary:Avaiyah S Feimster is a 45 y.o.caucasian female , who has a hx of bipolar disorder, who presented to [facility] reporting increasing anxiety and depression. Pt was admitted to [facility] on 08/28/15 , but was transferred to hospitalist service the same day for management of hyponatremia . Pt was medically stabilized and [facility] to continue psychiatric management .Pt continues to be anxious and has sleep issues. Will continue treatment.   Daily contact with patient to assess and evaluate symptoms and progress in treatment and Medication management   Will continue Zyprexa 5 mg po qhs for mood sx. Will continue Lamictal 25 mg po daily for mood lability. Will increase Doxepin to 20 mg po qhs for sleep. Will increase Gabapentin to 400 mg po tid for anxiety/pain. Will provide lidocaine patch for pain. Will reduce Klonopin to 0.5 mg po daily prn for anxiety sx. Will continue PRN medications as per agitation protocol. Started  home medications as per hospitalist DC instructions - where indicated, monitor CBGS for hx of diabetes.  Will continue to monitor vitals ,medication compliance and treatment side effects while patient is here.  Will monitor for medical issues as well as call consult as needed.  Reviewed labs TSH- wnl , urine preg test- negative , lipid panel- wnl , pending pl, pending hba1c.Pt has Na+ at 131 at this time and as per hospitalist recommendations she needs to follow up the level on her hospital follow up visit. Pt reports she has follow up scheduled with pcp on 09/04/15.This was also communicated to [name] [name] her son. CSW will continue working on disposition.  Chaplain consult. Patient to participate in therapeutic milieu .       Xia Stohr, MD 08/31/2015, 2:25 PM

## 2015-08-31 NOTE — Progress Notes (Signed)
Adult Psychoeducational Group Note  Date:  08/31/2015 Time:  8:10 PM  Group Topic/Focus:  Wrap-Up Group:   The focus of this group is to help patients review their daily goal of treatment and discuss progress on daily workbooks.  Participation Level:  Active  Participation Quality:  Appropriate  Affect:  Anxious  Cognitive:  Appropriate  Insight: Appropriate  Engagement in Group:  Engaged  Modes of Intervention:  Discussion  Additional Comments:  Pt seemed anxious, however pt was pleasant. Pt stated that her day was "chaotic" and full of ups and downs. Pt reported that she is looking forward to going home tomorrow.   [name] [name] 08/31/2015, 8:49 PM

## 2015-08-31 NOTE — BHH Group Notes (Signed)
BHH Group Notes:  (Counselor/Nursing/MHT/Case Management/Adjunct)  08/31/2015 1:15PM  Type of Therapy:  Group Therapy  Participation Level:  Active  Participation Quality:  Appropriate  Affect:  Flat  Cognitive:  Oriented  Insight:  Improving  Engagement in Group:  Limited  Engagement in Therapy:  Limited  Modes of Intervention:  Discussion, Exploration and Socialization  Summary of Progress/Problems: The topic for group was balance in life.  Pt participated in the discussion about when their life was in balance and out of balance and how this feels.  Pt discussed ways to get back in balance and short term goals they can work on to get where they want to be. In and out multiple times.  Engaged throughout, but minimal interaction.  "I know meds are supposed to help, and sometimes they do, but right now mine are making me sleepy.  And no one will explain to me what I am taking."   [name] [name] 08/31/2015 1:14 PM

## 2015-08-31 NOTE — Progress Notes (Signed)
DAR NOTE: Patient presents with anxious affect and depressed mood.  Denies auditory and visual hallucinations. Complain of body aches, back and knee pain.   Rates depression at 3, hopelessness at 1, and anxiety at 5.  Maintained on routine safety checks.  Medications given as prescribed.  Support and encouragement offered as needed.  Attended group and participated.  States goal for today is "be happy, go home."  Patient with a lot of somatic symptoms and complaints.  Patient reports that unit was too stimulating for her and would like to be discharge home tomorrow.  Patient was tearful  and agitated.  Patient requested and received Klonopin for severe agitation and anxiety with fair effect.

## 2015-09-01 DIAGNOSIS — F3132 Bipolar disorder, current episode depressed, moderate: Principal | ICD-10-CM

## 2015-09-01 LAB — GLUCOSE, CAPILLARY
GLUCOSE-CAPILLARY: 138 mg/dL — AB (ref 65–99)
GLUCOSE-CAPILLARY: 111 mg/dL — AB (ref 65–99)

## 2015-09-01 LAB — HEMOGLOBIN A1C
HEMOGLOBIN A1C: 7.3 % — AB (ref 4.8–5.6)
MEAN PLASMA GLUCOSE: 163 mg/dL

## 2015-09-01 LAB — PROLACTIN: Prolactin: 10.6 ng/mL (ref 4.8–23.3)

## 2015-09-01 MED ORDER — PANTOPRAZOLE SODIUM 40 MG PO TBEC: 40.0000 mg | DELAYED_RELEASE_TABLET | Freq: Two times a day (BID) | ORAL | Status: DC

## 2015-09-01 MED ORDER — ATENOLOL 25 MG PO TABS: 25.0000 mg | ORAL_TABLET | Freq: Every day | ORAL | Status: DC

## 2015-09-01 MED ORDER — GABAPENTIN 400 MG PO CAPS: 400.0000 mg | ORAL_CAPSULE | Freq: Three times a day (TID) | ORAL | Status: DC

## 2015-09-01 MED ORDER — OLANZAPINE 5 MG PO TBDP: 5.0000 mg | ORAL_TABLET | Freq: Every day | ORAL | Status: DC

## 2015-09-01 MED ORDER — DOXEPIN HCL 10 MG PO CAPS: 20.0000 mg | ORAL_CAPSULE | Freq: Every day | ORAL | Status: DC

## 2015-09-01 MED ORDER — METFORMIN HCL 850 MG PO TABS: 850.0000 mg | ORAL_TABLET | Freq: Two times a day (BID) | ORAL | Status: DC

## 2015-09-01 MED ORDER — HYDROXYZINE HCL 25 MG PO TABS: 25.0000 mg | ORAL_TABLET | Freq: Four times a day (QID) | ORAL | Status: DC | PRN

## 2015-09-01 MED ORDER — LAMOTRIGINE 25 MG PO TABS: 25.0000 mg | ORAL_TABLET | Freq: Every day | ORAL | Status: DC

## 2015-09-01 NOTE — Plan of Care (Signed)
Problem: Ineffective individual coping Goal: STG: Patient will remain free from self harm Outcome: Progressing Pt is safe and free from self harm     
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
## 2015-09-01 NOTE — Progress Notes (Signed)
Patient ID: [name] [name], female   DOB: 21-Sep-1970, 45 y.o.   MRN: [id number] D: Patient anxious about discharge tomorrow reports her day has been up and down. Pt reports she is not getting along with roommate due to hygiene and snoring issues. Denies SI/HI/AVH.No behavioral issues noted.  A: Support and encouragement offered as needed. Medications administered as prescribed.  R: Patient cooperative and appropriate on unit. Pt safe and compliant with medication. Will continue to monitor patient for safety and stability.

## 2015-09-01 NOTE — Discharge Summary (Signed)
Physician Discharge Summary Note  Patient:  [name] [name] is an 45 y.o., female MRN:  [id number] DOB:  1971/01/27 Patient phone:  [phone] (home)  Patient address:   [address] [place] [address],  Total Time spent with patient: 30 minutes  Date of Admission:  08/29/2015 Date of Discharge: 09/01/2015  Reason for Admission:  bipolar  Principal Problem: Bipolar disorder with moderate depression [facility]) Discharge Diagnoses: Patient Active Problem List   Diagnosis Date Noted  . Bipolar disorder with moderate depression (HCC) [F31.32] 08/31/2015  . Tobacco use disorder [F17.200] 08/28/2015  . HTN (hypertension) [I10] 08/28/2015  . Diabetes (HCC) [E11.9] 08/28/2015  . Hyponatremia [E87.1] 08/28/2015  . DM [E11.9] 10/26/2009  . OBESITY [E66.9] 10/26/2009  . ESSENTIAL HYPERTENSION [I10] 10/26/2009  . DISTURBANCE OF SKIN SENSATION [R20.9] 10/26/2009  . MIGRAINE HEADACHE [G43.909] 03/05/2007    Past Psychiatric History: see above noted  Past Medical History:  Past Medical History  Diagnosis Date  . Diabetes mellitus   . Asthma   . Anxiety disorder   . Bipolar affective disorder (HCC)   . Depression   . Migraine     Past Surgical History  Procedure Laterality Date  . Knee arthroscopy      x 2  . Tubal ligation    . Knee surgery Left     Incision made and knee cleaned out   Family History:  Family History  Problem Relation Age of Onset  . Bipolar disorder Mother   . Hypertension Father   . Diabetes Father    Family Psychiatric  History:  See above noted Social History:  History  Alcohol Use  . Yes    Comment: occasionally     History  Drug Use No    Social History   Social History  . Marital Status: Divorced    Spouse Name: N/A  . Number of Children: N/A  . Years of Education: N/A   Social History Main Topics  . Smoking status: Current Every Day Smoker -- 1.00 packs/day for 29 years    Types: Cigarettes  . Smokeless tobacco: Never Used  .  Alcohol Use: Yes     Comment: occasionally  . Drug Use: No  . Sexual Activity: Yes    Birth Control/ Protection: Surgical   Other Topics Concern  . None   Social History Narrative   Patient lives in [place] with her 2 kids- 16 in 92 years old.   She has total 4 kids.   She is a single mom.          Hospital Course:  [name] [name] is a 45 y.o. female, who has a hx of bipolar disorder, who presented to [facility] reporting increasing anxiety and depression.  Patient was found to have hyponatremia and was transferred to the medical service for further management and returned to [facility] upon stabilization.    [name] [name] was admitted for Bipolar disorder with moderate depression (HCC) and crisis management.  SHe was treated with listed below.  Medical problems were identified and treated as needed.  Home medications were restarted as appropriate.  Improvement was monitored by observation and Retta Diones daily report of symptom reduction.  Emotional and mental status was monitored by daily self inventory reports completed by Retta Diones and clinical staff.  Patient reported continued improvement, denied any new concerns.  Patient had been compliant on medications and denied side effects.  Support and encouragement was provided.    Patient did well during  inpatient stay.  At time of discharge, patient rated both depression and anxiety levels to be manageable and minimal.  Patient was able to identify the triggers of emotional crises and de-stabilizations.  Patient identified the positive things in life that would help in dealing with feelings of loss, depression and unhealthy or abusive tendencies.         [name] [name] was evaluated by the treatment team for stability and plans for continued recovery upon discharge.  SHe was offered further treatment options upon discharge including Residential, Intensive Outpatient and Outpatient treatment.  sHe will follow up with agencies listed below  for medication management and counseling.  Encouraged patient to maintain satisfactory support network and home environment.  Advised to adhere to medication compliance and outpatient treatment follow up.      [name] motivation was an integral factor for scheduling further treatment.  Employment, transportation, bed availability, health status, family support, and any pending legal issues were also considered during his hospital stay.  Upon completion of this admission the patient was both mentally and medically stable for discharge denying suicidal/homicidal ideation, auditory/visual/tactile hallucinations, delusional thoughts and paranoia.      Physical Findings: AIMS: Facial and Oral Movements Muscles of Facial Expression: None, normal Lips and Perioral Area: None, normal Jaw: None, normal Tongue: None, normal,Extremity Movements Upper (arms, wrists, hands, fingers): None, normal Lower (legs, knees, ankles, toes): None, normal, Trunk Movements Neck, shoulders, hips: None, normal, Overall Severity Severity of abnormal movements (highest score from questions above): None, normal Incapacitation due to abnormal movements: None, normal Patient's awareness of abnormal movements (rate only patient's report): No Awareness, Dental Status Current problems with teeth and/or dentures?: No Does patient usually wear dentures?: No  CIWA:    COWS:     Musculoskeletal: Strength & Muscle Tone: within normal limits Gait & Station: normal Patient leans: N/A  Psychiatric Specialty Exam:  SEE MD SRA Review of Systems  Psychiatric/Behavioral: Negative for depression, suicidal ideas and substance abuse. The patient is nervous/anxious.   All other systems reviewed and are negative.   Blood pressure 112/60, pulse 98, temperature 99.3 F (37.4 C), temperature source Oral, resp. rate 16, height 5\' 4"  (1.626 m), weight 85.6 kg (188 lb 11.4 oz), SpO2 100 %.Body mass index is 32.38 kg/(m^2).  Have you  used any form of tobacco in the last 30 days? (Cigarettes, Smokeless Tobacco, Cigars, and/or Pipes): Yes  Has this patient used any form of tobacco in the last 30 days? (Cigarettes, Smokeless Tobacco, Cigars, and/or Pipes) Yes, deferred  Blood Alcohol level:  Lab Results  Component Value Date   [facility] <5 08/24/2015   ETH <11 04/29/2014    Metabolic Disorder Labs:  Lab Results  Component Value Date   HGBA1C 7.3* 08/31/2015   MPG 163 08/31/2015   MPG 295* 04/29/2014   Lab Results  Component Value Date   PROLACTIN 10.6 08/31/2015   Lab Results  Component Value Date   CHOL 151 08/31/2015   TRIG 89 08/31/2015   HDL 50 08/31/2015   CHOLHDL 3.0 08/31/2015   VLDL 18 08/31/2015   LDLCALC 83 08/31/2015   LDLCALC See Comment mg/dL 16/03/9603    See Psychiatric Specialty Exam and Suicide Risk Assessment completed by Attending Physician prior to discharge.  Discharge destination:  Home  Is patient on multiple antipsychotic therapies at discharge:  No   Has Patient had three or more failed trials of antipsychotic monotherapy by history:  No  Recommended Plan for Multiple Antipsychotic  Therapies: NA     Medication List    STOP taking these medications        albuterol 108 (90 Base) MCG/ACT inhaler  Commonly known as:  PROVENTIL HFA;VENTOLIN HFA     busPIRone 5 MG tablet  Commonly known as:  BUSPAR     clonazePAM 0.5 MG tablet  Commonly known as:  KLONOPIN     ibuprofen 600 MG tablet  Commonly known as:  ADVIL,MOTRIN     nicotine polacrilex 2 MG gum  Commonly known as:  NICORETTE     ondansetron 4 MG disintegrating tablet  Commonly known as:  ZOFRAN ODT     promethazine 25 MG tablet  Commonly known as:  PHENERGAN     traZODone 50 MG tablet  Commonly known as:  DESYREL     zolpidem 10 MG tablet  Commonly known as:  AMBIEN      TAKE these medications      Indication   atenolol 25 MG tablet  Commonly known as:  TENORMIN  Take 1 tablet (25 mg total) by mouth  daily.   Indication:  mood stabilization     doxepin 10 MG capsule  Commonly known as:  SINEQUAN  Take 2 capsules (20 mg total) by mouth at bedtime.   Indication:  Anxiety/insomnia     gabapentin 400 MG capsule  Commonly known as:  NEURONTIN  Take 1 capsule (400 mg total) by mouth 3 (three) times daily.   Indication:  Aggressive Behavior, Agitation, Neuropathic Pain     hydrOXYzine 25 MG tablet  Commonly known as:  ATARAX/VISTARIL  Take 1 tablet (25 mg total) by mouth every 6 (six) hours as needed for anxiety.   Indication:  Anxiety     lamoTRIgine 25 MG tablet  Commonly known as:  LAMICTAL  Take 1 tablet (25 mg total) by mouth daily.   Indication:  Mood stabilization     metFORMIN 850 MG tablet  Commonly known as:  GLUCOPHAGE  Take 1 tablet (850 mg total) by mouth 2 (two) times daily.   Indication:  Type 2 Diabetes     OLANZapine zydis 5 MG disintegrating tablet  Commonly known as:  ZYPREXA  Take 1 tablet (5 mg total) by mouth at bedtime.   Indication:  Mood control     pantoprazole 40 MG tablet  Commonly known as:  PROTONIX  Take 1 tablet (40 mg total) by mouth 2 (two) times daily before a meal.   Indication:  Gastroesophageal Reflux Disease           Follow-up Information    Follow up with general medical clinic.      Go in 1 week to follow up.   Contact information:   General medical clinic at [facility] [address] [place], [place] [address]       Follow up with [facility].   Why:  Someone should be in touch with you on Monday.  If you have not heard from them, call the office and tell them you need a hospital follow up appointment.  Tell them Rod faxed your information to them on Thursday.   Contact information:   [address] "Judie Petit [facility] [[phone]] [phone]      Follow-up recommendations:  Activity:  as tol Diet:  as tol  Comments:  1.  Take all your medications as prescribed.   2.  Report any adverse side effects to outpatient provider. 3.  Patient  instructed to not use alcohol or illegal  drugs while on prescription medicines. 4.  In the event of worsening symptoms, instructed patient to call 911, the crisis hotline or go to nearest emergency room for evaluation of symptoms.  Signed: Lindwood Qua, NP [facility] 09/01/2015, 10:24 AM  I personally assessed the patient and formulated the plan Madie Reno A. Dub Mikes, M.D.

## 2015-09-01 NOTE — Progress Notes (Signed)
  [facility] Adult Case Management Discharge Plan :  Will you be returning to the same living situation after discharge:  Yes,  returning home At discharge, do you have transportation home?: Yes,  family will transport Do you have the ability to pay for your medications: Yes,  insurance  Release of information consent forms completed and in the chart;  Patient's signature needed at discharge.  Patient to Follow up at: Follow-up Information    Follow up with general medical clinic.      Go in 1 week to follow up.   Contact information:   General medical clinic at [facility] [address] [place], [place] [address]       Follow up with [facility].   Why:  Someone should be in touch with you on Monday.  If you have not heard from them, call the office and tell them you need a hospital follow up appointment.  Tell them Rod faxed your information to them on Thursday.   Contact information:   [address] "[place]" [facility] [336] [phone]      Next level of care provider has access to [facility]:  Safety Planning and Suicide Prevention discussed: Yes,  yes  Have you used any form of tobacco in the last 30 days? (Cigarettes, Smokeless Tobacco, Cigars, and/or Pipes): Yes  Has patient been referred to the Quitline?: Patient refused referral  Patient has been referred for addiction treatment: N/A  Jonathon Jordan 09/01/2015, 11:12 AM

## 2015-09-01 NOTE — Progress Notes (Signed)
Patient discharged home with prescription. Patient was stable and appreciative at that time. All papers and prescriptions were given and valuables returned. Verbal understanding expressed. Denies SI/HI and A/VH. Patient given opportunity to express concerns and ask questions.  

## 2015-10-25 ENCOUNTER — Encounter (HOSPITAL_BASED_OUTPATIENT_CLINIC_OR_DEPARTMENT_OTHER): Payer: Self-pay | Admitting: *Deleted

## 2015-10-25 ENCOUNTER — Emergency Department (HOSPITAL_BASED_OUTPATIENT_CLINIC_OR_DEPARTMENT_OTHER)
Admission: EM | Admit: 2015-10-25 | Discharge: 2015-10-25 | Disposition: A | Discharge status: Home / Self Care | Payer: Medicaid Other | Attending: Emergency Medicine | Admitting: Emergency Medicine

## 2015-10-25 ENCOUNTER — Emergency Department (HOSPITAL_BASED_OUTPATIENT_CLINIC_OR_DEPARTMENT_OTHER): Payer: Medicaid Other

## 2015-10-25 DIAGNOSIS — F17210 Nicotine dependence, cigarettes, uncomplicated: Secondary | ICD-10-CM | POA: Diagnosis not present

## 2015-10-25 DIAGNOSIS — F209 Schizophrenia, unspecified: Secondary | ICD-10-CM | POA: Insufficient documentation

## 2015-10-25 DIAGNOSIS — F329 Major depressive disorder, single episode, unspecified: Secondary | ICD-10-CM | POA: Diagnosis not present

## 2015-10-25 DIAGNOSIS — R112 Nausea with vomiting, unspecified: Secondary | ICD-10-CM | POA: Insufficient documentation

## 2015-10-25 DIAGNOSIS — J45909 Unspecified asthma, uncomplicated: Secondary | ICD-10-CM | POA: Diagnosis not present

## 2015-10-25 DIAGNOSIS — Z7984 Long term (current) use of oral hypoglycemic drugs: Secondary | ICD-10-CM | POA: Insufficient documentation

## 2015-10-25 DIAGNOSIS — R1010 Upper abdominal pain, unspecified: Secondary | ICD-10-CM | POA: Diagnosis present

## 2015-10-25 DIAGNOSIS — E119 Type 2 diabetes mellitus without complications: Secondary | ICD-10-CM | POA: Insufficient documentation

## 2015-10-25 DIAGNOSIS — Z79899 Other long term (current) drug therapy: Secondary | ICD-10-CM | POA: Diagnosis not present

## 2015-10-25 DIAGNOSIS — R1084 Generalized abdominal pain: Secondary | ICD-10-CM | POA: Insufficient documentation

## 2015-10-25 HISTORY — DX: Schizophrenia, unspecified: F20.9

## 2015-10-25 LAB — URINALYSIS, ROUTINE W REFLEX MICROSCOPIC
Bilirubin Urine: NEGATIVE
Glucose, UA: NEGATIVE mg/dL
Hgb urine dipstick: NEGATIVE
Ketones, ur: NEGATIVE mg/dL
NITRITE: NEGATIVE
PH: 6 (ref 5.0–8.0)
Protein, ur: NEGATIVE mg/dL
Specific Gravity, Urine: 1.006 (ref 1.005–1.030)

## 2015-10-25 LAB — CBC WITH DIFFERENTIAL/PLATELET
Basophils Absolute: 0 10*3/uL (ref 0.0–0.1)
Basophils Relative: 1 %
EOS ABS: 0.3 10*3/uL (ref 0.0–0.7)
EOS PCT: 4 %
HCT: 34.9 % — ABNORMAL LOW (ref 36.0–46.0)
Hemoglobin: 11.8 g/dL — ABNORMAL LOW (ref 12.0–15.0)
LYMPHS ABS: 2.8 10*3/uL (ref 0.7–4.0)
Lymphocytes Relative: 33 %
MCH: 30.4 pg (ref 26.0–34.0)
MCHC: 33.8 g/dL (ref 30.0–36.0)
MCV: 89.9 fL (ref 78.0–100.0)
Monocytes Absolute: 0.5 10*3/uL (ref 0.1–1.0)
Monocytes Relative: 6 %
Neutro Abs: 4.8 10*3/uL (ref 1.7–7.7)
Neutrophils Relative %: 56 %
Platelets: 234 10*3/uL (ref 150–400)
RBC: 3.88 MIL/uL (ref 3.87–5.11)
RDW: 13.5 % (ref 11.5–15.5)
WBC: 8.4 10*3/uL (ref 4.0–10.5)

## 2015-10-25 LAB — COMPREHENSIVE METABOLIC PANEL
ALT: 18 U/L (ref 14–54)
ANION GAP: 9 (ref 5–15)
AST: 19 U/L (ref 15–41)
Albumin: 3.8 g/dL (ref 3.5–5.0)
Alkaline Phosphatase: 63 U/L (ref 38–126)
BUN: 12 mg/dL (ref 6–20)
CHLORIDE: 103 mmol/L (ref 101–111)
CO2: 22 mmol/L (ref 22–32)
Calcium: 9 mg/dL (ref 8.9–10.3)
Creatinine, Ser: 0.85 mg/dL (ref 0.44–1.00)
GFR calc non Af Amer: >60 mL/min (ref >60)
Glucose, Bld: 175 mg/dL — ABNORMAL HIGH (ref 65–99)
POTASSIUM: 4.1 mmol/L (ref 3.5–5.1)
SODIUM: 134 mmol/L — AB (ref 135–145)
Total Bilirubin: 0.4 mg/dL (ref 0.3–1.2)
Total Protein: 6.3 g/dL — ABNORMAL LOW (ref 6.5–8.1)

## 2015-10-25 LAB — RAPID URINE DRUG SCREEN, HOSP PERFORMED
AMPHETAMINES: NOT DETECTED
Barbiturates: NOT DETECTED
Benzodiazepines: POSITIVE — AB
Cocaine: NOT DETECTED
OPIATES: NOT DETECTED
TETRAHYDROCANNABINOL: NOT DETECTED

## 2015-10-25 LAB — URINE MICROSCOPIC-ADD ON

## 2015-10-25 LAB — PREGNANCY, URINE: Preg Test, Ur: NEGATIVE

## 2015-10-25 LAB — LIPASE, BLOOD: Lipase: 21 U/L (ref 11–51)

## 2015-10-25 MED ORDER — DICYCLOMINE HCL 20 MG PO TABS
20.0000 mg | ORAL_TABLET | Freq: Two times a day (BID) | ORAL | Status: DC
Start: 2015-10-25 — End: 2016-01-19

## 2015-10-25 NOTE — ED Notes (Signed)
PA at bedside.

## 2015-10-25 NOTE — ED Provider Notes (Signed)
CSN: [id number]     Arrival date & time 10/25/15  2037 History   First MD Initiated Contact with Patient 10/25/15 2051     Chief Complaint  Patient presents with  . Abdominal Pain   (Consider location/radiation/quality/duration/timing/severity/associated sxs/prior Treatment) HPI Comments: Patient with history of schizophrenia presents with complaint of upper abdominal pain which is been ongoing for the past one month. Patient states that she feels constipated. She has had occasional episodes of nausea and vomiting with this. No fevers, cough, chest pain, diarrhea, urinary symptoms. No vaginal bleeding or discharge. She denies previous abdominal surgeries. Patient history on my exam does not match what she told EMS. She told EMS that she is pregnant and having contractions but denies this to me. No treatments prior to arrival other than icy hot on the abdomen as well as an over-the-counter colon cleanse that she bought at a vitamin store. The onset of this condition was acute. The course is constant. Aggravating factors: none. Alleviating factors: none.    The history is provided by the patient.    Past Medical History  Diagnosis Date  . Diabetes mellitus   . Asthma   . Anxiety disorder   . Bipolar affective disorder (HCC)   . Depression   . Migraine   . Schizophrenia [facility])    Past Surgical History  Procedure Laterality Date  . Knee arthroscopy      x 2  . Tubal ligation    . Knee surgery Left     Incision made and knee cleaned out   Family History  Problem Relation Age of Onset  . Bipolar disorder Mother   . Hypertension Father   . Diabetes Father    Social History  Substance Use Topics  . Smoking status: Current Every Day Smoker -- 1.00 packs/day for 29 years    Types: Cigarettes  . Smokeless tobacco: Never Used  . Alcohol Use: Yes     Comment: occasionally   OB History    No data available     Review of Systems  Constitutional: Negative for fever.  HENT: Negative  for rhinorrhea and sore throat.   Eyes: Negative for redness.  Respiratory: Negative for cough.   Cardiovascular: Negative for chest pain.  Gastrointestinal: Positive for nausea, vomiting and abdominal pain. Negative for diarrhea, constipation and blood in stool.  Genitourinary: Negative for dysuria, frequency, vaginal bleeding and vaginal discharge.  Musculoskeletal: Negative for myalgias.  Skin: Negative for rash.  Neurological: Negative for headaches.      Allergies  Tramadol and Nicotine  Home Medications   Prior to Admission medications   Medication Sig Start Date End Date Taking? Authorizing Provider  atenolol (TENORMIN) 25 MG tablet Take 1 tablet (25 mg total) by mouth daily. 09/01/15   Adonis Brook, NP  doxepin (SINEQUAN) 10 MG capsule Take 2 capsules (20 mg total) by mouth at bedtime. 09/01/15   Adonis Brook, NP  gabapentin (NEURONTIN) 400 MG capsule Take 1 capsule (400 mg total) by mouth 3 (three) times daily. 09/01/15   Adonis Brook, NP  hydrOXYzine (ATARAX/VISTARIL) 25 MG tablet Take 1 tablet (25 mg total) by mouth every 6 (six) hours as needed for anxiety. 09/01/15   Adonis Brook, NP  lamoTRIgine (LAMICTAL) 25 MG tablet Take 1 tablet (25 mg total) by mouth daily. 09/01/15   Adonis Brook, NP  metFORMIN (GLUCOPHAGE) 850 MG tablet Take 1 tablet (850 mg total) by mouth 2 (two) times daily. 09/01/15   Adonis Brook, NP  OLANZapine  zydis (ZYPREXA) 5 MG disintegrating tablet Take 1 tablet (5 mg total) by mouth at bedtime. 09/01/15   Adonis Brook, NP  pantoprazole (PROTONIX) 40 MG tablet Take 1 tablet (40 mg total) by mouth 2 (two) times daily before a meal. 09/01/15   Adonis Brook, NP   BP 133/109 mmHg  Pulse 83  Temp(Src) 97.9 F (36.6 C) (Oral)  Resp 18  Ht 5\' 8"  (1.727 m)  Wt 77.111 kg  BMI 25.85 kg/m2  SpO2 99%   Physical Exam  Constitutional: She appears well-developed and well-nourished.  HENT:  Head: Normocephalic and atraumatic.  Eyes: Conjunctivae are  normal. Right eye exhibits no discharge. Left eye exhibits no discharge.  Neck: Normal range of motion. Neck supple.  Cardiovascular: Normal rate, regular rhythm and normal heart sounds.   Pulmonary/Chest: Effort normal and breath sounds normal. No respiratory distress. She has no wheezes. She has no rales.  Abdominal: Soft. There is tenderness (mild generalized tenderness). There is no rebound and no guarding.  Neurological: She is alert.  Skin: Skin is warm and dry.  Psychiatric: She has a normal mood and affect.  Nursing note and vitals reviewed.   ED Course  Procedures (including critical care time) Labs Review Labs Reviewed  URINE RAPID DRUG SCREEN, HOSP PERFORMED - Abnormal; Notable for the following:    Benzodiazepines POSITIVE (*)    All other components within normal limits  URINALYSIS, ROUTINE W REFLEX MICROSCOPIC (NOT AT [facility]) - Abnormal; Notable for the following:    APPearance CLOUDY (*)    Leukocytes, UA SMALL (*)    All other components within normal limits  CBC WITH DIFFERENTIAL/PLATELET - Abnormal; Notable for the following:    Hemoglobin 11.8 (*)    HCT 34.9 (*)    All other components within normal limits  COMPREHENSIVE METABOLIC PANEL - Abnormal; Notable for the following:    Sodium 134 (*)    Glucose, Bld 175 (*)    Total Protein 6.3 (*)    All other components within normal limits  URINE MICROSCOPIC-ADD ON - Abnormal; Notable for the following:    Squamous Epithelial / LPF 0-5 (*)    Bacteria, UA RARE (*)    All other components within normal limits  PREGNANCY, URINE  LIPASE, BLOOD    Imaging Review Dg Abd Acute W/chest  10/25/2015  CLINICAL DATA:  Initial evaluation for two-month history of upper abdominal pain. EXAM: DG ABDOMEN ACUTE W/ 1V CHEST COMPARISON:  Prior radiograph from 07/15/2012. FINDINGS: Cardiac and mediastinal silhouettes are stable in size and contour, and remain within normal limits. Lungs are normally inflated. Hazy opacity overlying  the lung bases favored to be related to overlying soft tissue attenuation. No focal infiltrates. No pulmonary edema or pleural effusion. No pneumothorax. Bowel gas pattern within normal limits without evidence for obstruction or ileus. No abnormal bowel wall thickening. No free air. No soft tissue mass or abnormal calcification. No acute osseous abnormality. Degenerative spondylolysis noted within the lower lumbar spine. Degenerative osteoarthritic changes present about the hips bilaterally is well. IMPRESSION: 1. Nonobstructive bowel gas pattern with no radiographic evidence for acute intra-abdominal process. 2. No active cardiopulmonary disease. Electronically Signed   By: Rise Mu M.D.   On: 10/25/2015 22:20   I have personally reviewed and evaluated these images and lab results as part of my medical decision-making.   9:03 PM Patient seen and examined. Work-up initiated. Medications ordered.   Vital signs reviewed and are as follows: BP 133/109 mmHg  Pulse  83  Temp(Src) 97.9 F (36.6 C) (Oral)  Resp 18  Ht 5\' 8"  (1.727 m)  Wt 77.111 kg  BMI 25.85 kg/m2  SpO2 99%  11:10 PM workup reassuring. Patient informed of results. She is up in room and appears comfortable. Will discharge to home with prescription for Bentyl. Abdomen remains soft on reexam.  The patient was urged to return to the Emergency Department immediately with worsening of current symptoms, worsening abdominal pain, persistent vomiting, blood noted in stools, fever, or any other concerns. The patient verbalized understanding.    MDM   Final diagnoses:  Generalized abdominal pain   Patient with generalized abdominal pain. Workup here is reassuring. Patient with abdominal pain. Vitals are stable, no fever. Labs with normal white count. Imaging without signs of obstruction. No signs of dehydration, patient is tolerating PO's. Lungs are clear and no signs suggestive of PNA. Low concern for appendicitis,  cholecystitis, pancreatitis, ruptured viscus, UTI, kidney stone, aortic dissection, aortic aneurysm or other emergent abdominal etiology. Supportive therapy indicated with return if symptoms worsen.     Renne Crigler, PA-C 10/25/15 2311  Geoffery Lyons, MD 10/25/15 [phone]

## 2015-10-25 NOTE — Discharge Instructions (Signed)
Please read and follow all provided instructions.  Your diagnoses today include:  1. Generalized abdominal pain     Tests performed today include:  Blood counts and electrolytes  Blood tests to check liver and kidney function  Blood tests to check pancreas function  Urine test to look for infection and pregnancy (in women)  X-ray - normal  Vital signs. See below for your results today.   Medications prescribed:   Bentyl - medication for intestinal cramps and spasms  Take any prescribed medications only as directed.  Home care instructions:   Follow any educational materials contained in this packet.  Follow-up instructions: Please follow-up with your primary care provider in the next 3 days for further evaluation of your symptoms.    Return instructions:  SEEK IMMEDIATE MEDICAL ATTENTION IF:  The pain does not go away or becomes severe   A temperature above 101F develops   Repeated vomiting occurs (multiple episodes)   The pain becomes localized to portions of the abdomen. The right side could possibly be appendicitis. In an adult, the left lower portion of the abdomen could be colitis or diverticulitis.   Blood is being passed in stools or vomit (bright red or black tarry stools)   You develop chest pain, difficulty breathing, dizziness or fainting, or become confused, poorly responsive, or inconsolable (young children)  If you have any other emergent concerns regarding your health  Additional Information: Abdominal (belly) pain can be caused by many things. Your caregiver performed an examination and possibly ordered blood/urine tests and imaging (CT scan, x-rays, ultrasound). Many cases can be observed and treated at home after initial evaluation in the emergency department. Even though you are being discharged home, abdominal pain can be unpredictable. Therefore, you need a repeated exam if your pain does not resolve, returns, or worsens. Most patients with  abdominal pain don't have to be admitted to the hospital or have surgery, but serious problems like appendicitis and gallbladder attacks can start out as nonspecific pain. Many abdominal conditions cannot be diagnosed in one visit, so follow-up evaluations are very important.  Your vital signs today were: BP 130/94 mmHg   Pulse 93   Temp(Src) 97.9 F (36.6 C) (Oral)   Resp 18   Ht 5\' 8"  (1.727 m)   Wt 77.111 kg   BMI 25.85 kg/m2   SpO2 99% If your blood pressure (bp) was elevated above 135/85 this visit, please have this repeated by your doctor within one month. --------------

## 2015-10-25 NOTE — ED Notes (Addendum)
Per EMS:  Pt had a tubal ligation 18 years ago.  Pt reports upper abdominal pain.  Pt reports that she is 5-7 months pregnant and has been having contractions for 2 months-no evidence of pregnancy per EMS. Denies N/V/D.

## 2015-10-25 NOTE — ED Notes (Signed)
Pt calling a ride home.  No acute distress noted.

## 2016-01-19 ENCOUNTER — Emergency Department (HOSPITAL_BASED_OUTPATIENT_CLINIC_OR_DEPARTMENT_OTHER)
Admission: EM | Admit: 2016-01-19 | Discharge: 2016-01-19 | Disposition: A | Discharge status: Home / Self Care | Payer: Medicaid Other | Attending: Emergency Medicine | Admitting: Emergency Medicine

## 2016-01-19 ENCOUNTER — Encounter (HOSPITAL_BASED_OUTPATIENT_CLINIC_OR_DEPARTMENT_OTHER): Payer: Self-pay | Admitting: *Deleted

## 2016-01-19 ENCOUNTER — Emergency Department (HOSPITAL_BASED_OUTPATIENT_CLINIC_OR_DEPARTMENT_OTHER): Payer: Medicaid Other

## 2016-01-19 DIAGNOSIS — R42 Dizziness and giddiness: Secondary | ICD-10-CM | POA: Insufficient documentation

## 2016-01-19 DIAGNOSIS — R51 Headache: Secondary | ICD-10-CM | POA: Insufficient documentation

## 2016-01-19 DIAGNOSIS — Z79899 Other long term (current) drug therapy: Secondary | ICD-10-CM | POA: Insufficient documentation

## 2016-01-19 DIAGNOSIS — Z7984 Long term (current) use of oral hypoglycemic drugs: Secondary | ICD-10-CM | POA: Diagnosis not present

## 2016-01-19 DIAGNOSIS — R110 Nausea: Secondary | ICD-10-CM | POA: Diagnosis not present

## 2016-01-19 DIAGNOSIS — I10 Essential (primary) hypertension: Secondary | ICD-10-CM | POA: Diagnosis not present

## 2016-01-19 DIAGNOSIS — E119 Type 2 diabetes mellitus without complications: Secondary | ICD-10-CM | POA: Insufficient documentation

## 2016-01-19 DIAGNOSIS — J45909 Unspecified asthma, uncomplicated: Secondary | ICD-10-CM | POA: Diagnosis not present

## 2016-01-19 DIAGNOSIS — F17210 Nicotine dependence, cigarettes, uncomplicated: Secondary | ICD-10-CM | POA: Diagnosis not present

## 2016-01-19 LAB — CBG MONITORING, ED: GLUCOSE-CAPILLARY: 211 mg/dL — AB (ref 65–99)

## 2016-01-19 MED ORDER — GABAPENTIN 400 MG PO CAPS: 400.0000 mg | ORAL_CAPSULE | Freq: Three times a day (TID) | ORAL | 3 refills | Status: DC

## 2016-01-19 MED ORDER — ALBUTEROL SULFATE (2.5 MG/3ML) 0.083% IN NEBU
2.5000 mg | INHALATION_SOLUTION | Freq: Once | RESPIRATORY_TRACT | Status: AC
  Administered 2016-01-19: 2.5 mg via RESPIRATORY_TRACT
  Filled 2016-01-19: qty 3

## 2016-01-19 MED ORDER — ATENOLOL 25 MG PO TABS: 25.0000 mg | ORAL_TABLET | Freq: Every day | ORAL | 3 refills | Status: DC

## 2016-01-19 MED ORDER — ONDANSETRON HCL 4 MG PO TABS: 4.0000 mg | ORAL_TABLET | Freq: Three times a day (TID) | ORAL | 0 refills | Status: DC | PRN

## 2016-01-19 MED ORDER — METFORMIN HCL 850 MG PO TABS: 850.0000 mg | ORAL_TABLET | Freq: Two times a day (BID) | ORAL | 3 refills | Status: DC

## 2016-01-19 MED ORDER — DOXEPIN HCL 10 MG PO CAPS: 20.0000 mg | ORAL_CAPSULE | Freq: Every day | ORAL | 3 refills | Status: DC

## 2016-01-19 MED ORDER — CLONAZEPAM 0.5 MG PO TABS: 0.5000 mg | ORAL_TABLET | Freq: Two times a day (BID) | ORAL | 0 refills | Status: DC | PRN

## 2016-01-19 MED ORDER — OLANZAPINE 5 MG PO TBDP: 5.0000 mg | ORAL_TABLET | Freq: Every day | ORAL | 3 refills | Status: DC

## 2016-01-19 MED ORDER — IBUPROFEN 200 MG PO TABS: 200.0000 mg | ORAL_TABLET | Freq: Four times a day (QID) | ORAL | 3 refills | Status: DC | PRN

## 2016-01-19 MED ORDER — LAMOTRIGINE 25 MG PO TABS: 25.0000 mg | ORAL_TABLET | Freq: Every day | ORAL | 3 refills | Status: DC

## 2016-01-19 MED ORDER — TRAZODONE HCL 50 MG PO TABS: 50.0000 mg | ORAL_TABLET | Freq: Every day | ORAL | 3 refills | Status: DC

## 2016-01-19 MED ORDER — HYDROXYZINE HCL 25 MG PO TABS: 25.0000 mg | ORAL_TABLET | Freq: Four times a day (QID) | ORAL | 3 refills | Status: DC | PRN

## 2016-01-19 MED ORDER — PANTOPRAZOLE SODIUM 40 MG PO TBEC: 40.0000 mg | DELAYED_RELEASE_TABLET | Freq: Two times a day (BID) | ORAL | 3 refills | Status: DC

## 2016-01-19 MED ORDER — ALBUTEROL SULFATE HFA 108 (90 BASE) MCG/ACT IN AERS: 2.0000 | INHALATION_SPRAY | Freq: Four times a day (QID) | RESPIRATORY_TRACT | 2 refills | Status: DC | PRN

## 2016-01-19 MED ORDER — ALBUTEROL SULFATE HFA 108 (90 BASE) MCG/ACT IN AERS
2.0000 | INHALATION_SPRAY | Freq: Once | RESPIRATORY_TRACT | Status: AC
  Administered 2016-01-19: 2 via RESPIRATORY_TRACT
  Filled 2016-01-19: qty 6.7

## 2016-01-19 NOTE — ED Notes (Signed)
RT at bedside.

## 2016-01-19 NOTE — ED Notes (Signed)
Pt. Just returned from radiology  

## 2016-01-19 NOTE — Discharge Instructions (Signed)
If symptoms persist after 48-72 hours after restarting home medications, please return for further evaluation.

## 2016-01-19 NOTE — ED Triage Notes (Signed)
Dizziness for a month. Headache and back pain.

## 2016-01-19 NOTE — ED Provider Notes (Signed)
[facility] Provider Note   CSN: [id number] Arrival date & time: 01/19/16  1818  First Provider Contact:  First MD Initiated Contact with Patient 01/19/16 1836        History   Chief Complaint Chief Complaint  Patient presents with  . Dizziness    HPI [name] [name] is a 46 y.o. female.  Patient presents today with multiple complaints of headache, nausea, dizziness, and being out of her home medicines for 3 months. Patient is accompanied by her social worker today who was concerned about the patient when she learned she has been not been on any medicines since April. Patient was discharged from mental health in March and was given a one month supply at that time. However, patient has been unable to follow up with her PCP due to transportation and insurance issues. She is currently in between providers and social work is helping her to get established with a new provider.       Past Medical History:  Diagnosis Date  . Anxiety disorder   . Asthma   . Bipolar affective disorder (HCC)   . Depression   . Diabetes mellitus   . Migraine   . Schizophrenia [facility])     Patient Active Problem List   Diagnosis Date Noted  . Bipolar disorder with moderate depression (HCC) 08/31/2015  . Tobacco use disorder 08/28/2015  . HTN (hypertension) 08/28/2015  . Diabetes (HCC) 08/28/2015  . Hyponatremia 08/28/2015  . DM 10/26/2009  . OBESITY 10/26/2009  . ESSENTIAL HYPERTENSION 10/26/2009  . DISTURBANCE OF SKIN SENSATION 10/26/2009  . MIGRAINE HEADACHE 03/05/2007    Past Surgical History:  Procedure Laterality Date  . KNEE ARTHROSCOPY     x 2  . KNEE SURGERY Left    Incision made and knee cleaned out  . TUBAL LIGATION      OB History    No data available       Home Medications    Prior to Admission medications   Medication Sig Start Date End Date Taking? Authorizing Provider  gabapentin (NEURONTIN) 400 MG capsule Take 1 capsule (400 mg total) by mouth 3  (three) times daily. 09/01/15  Yes Adonis Brook, NP  atenolol (TENORMIN) 25 MG tablet Take 1 tablet (25 mg total) by mouth daily. 09/01/15   Adonis Brook, NP  dicyclomine (BENTYL) 20 MG tablet Take 1 tablet (20 mg total) by mouth 2 (two) times daily. 10/25/15   Renne Crigler, PA-C  doxepin (SINEQUAN) 10 MG capsule Take 2 capsules (20 mg total) by mouth at bedtime. 09/01/15   Adonis Brook, NP  hydrOXYzine (ATARAX/VISTARIL) 25 MG tablet Take 1 tablet (25 mg total) by mouth every 6 (six) hours as needed for anxiety. 09/01/15   Adonis Brook, NP  lamoTRIgine (LAMICTAL) 25 MG tablet Take 1 tablet (25 mg total) by mouth daily. 09/01/15   Adonis Brook, NP  metFORMIN (GLUCOPHAGE) 850 MG tablet Take 1 tablet (850 mg total) by mouth 2 (two) times daily. 09/01/15   Adonis Brook, NP  OLANZapine zydis (ZYPREXA) 5 MG disintegrating tablet Take 1 tablet (5 mg total) by mouth at bedtime. 09/01/15   Adonis Brook, NP  pantoprazole (PROTONIX) 40 MG tablet Take 1 tablet (40 mg total) by mouth 2 (two) times daily before a meal. 09/01/15   Adonis Brook, NP    Family History Family History  Problem Relation Age of Onset  . Bipolar disorder Mother   . Hypertension Father   . Diabetes Father  Social History Social History  Substance Use Topics  . Smoking status: Current Every Day Smoker    Packs/day: 1.00    Years: 29.00    Types: Cigarettes  . Smokeless tobacco: Never Used  . Alcohol use Yes     Comment: occasionally     Allergies   Tramadol and Nicotine   Review of Systems Review of Systems  Constitutional: Negative.   Eyes: Negative.   Respiratory: Negative.   Cardiovascular: Negative.   Gastrointestinal: Negative.   Endocrine: Negative.   Genitourinary: Negative.   Musculoskeletal: Negative.   Skin: Negative.   Allergic/Immunologic: Negative.   Neurological: Positive for dizziness and headaches.  Hematological: Negative.   Psychiatric/Behavioral: The patient is nervous/anxious.       Physical Exam Updated Vital Signs BP 155/96   Pulse 107   Temp 99 F (37.2 C) (Oral)   Resp 20   Ht 5\' 7"  (1.702 m)   Wt 77.1 kg   SpO2 93%   BMI 26.63 kg/m   Physical Exam  Constitutional: She is oriented to person, place, and time. She appears well-developed and well-nourished. No distress.  HENT:  Head: Normocephalic and atraumatic.  Eyes: Conjunctivae are normal.  Neck: Neck supple.  Cardiovascular: Regular rhythm.   No murmur heard. Tachycardic   Pulmonary/Chest: Effort normal. No respiratory distress. She has wheezes.  Abdominal: Soft. There is no tenderness.  Musculoskeletal: She exhibits no edema.  Neurological: She is alert and oriented to person, place, and time.  Skin: Skin is warm and dry.  Psychiatric: She has a normal mood and affect.  Nursing note and vitals reviewed.    ED Treatments / Results  Labs (all labs ordered are listed, but only abnormal results are displayed) Labs Reviewed  CBG MONITORING, ED    EKG  EKG Interpretation  Date/Time:  Friday January 19 2016 18:29:20 EDT Ventricular Rate:  111 PR Interval:    QRS Duration: 84 QT Interval:  344 QTC Calculation: 468 R Axis:   71 Text Interpretation:  Sinus tachycardia Anterior infarct, old Abnormal ekg Confirmed by Radford Pax  MD, ROBERT ([id number]) on 01/19/2016 6:34:47 PM       Radiology No results found.  Procedures Procedures (including critical care time)  Medications Ordered in ED Medications  albuterol (PROVENTIL) (2.5 MG/3ML) 0.083% nebulizer solution 2.5 mg (not administered)     Initial Impression / Assessment and Plan / ED Course  I have reviewed the triage vital signs and the nursing notes.  Pertinent labs & imaging results that were available during my care of the patient were reviewed by me and considered in my medical decision making (see chart for details).  Clinical Course   Wheezing: CXR negative. Given albuterol nebulizer in ED and sent home with a  prescription for prn albuterol. Encouraged smoking cessation.   Headache/dizziness: Likely due to being out of her home medications. Refills given today. Told social worker that if symptoms have not resolved 48-72 hours after restarting her home meds, to bring patient back for further evaluation.     Final Clinical Impressions(s) / ED Diagnoses   Final diagnoses:  None    New Prescriptions New Prescriptions   No medications on file     Reymundo Poll, MD 01/19/16 2035    Nelva Nay, MD 01/20/16 2104

## 2017-06-07 ENCOUNTER — Other Ambulatory Visit: Payer: Self-pay

## 2017-06-07 ENCOUNTER — Inpatient Hospital Stay (HOSPITAL_COMMUNITY)
Admission: EM | Admit: 2017-06-07 | Discharge: 2017-06-10 | DRG: 759 | Disposition: A | Discharge status: Home / Self Care | Payer: Medicaid Other | Attending: Family Medicine | Admitting: Family Medicine

## 2017-06-07 ENCOUNTER — Encounter (HOSPITAL_COMMUNITY): Payer: Self-pay | Admitting: *Deleted

## 2017-06-07 DIAGNOSIS — F17210 Nicotine dependence, cigarettes, uncomplicated: Secondary | ICD-10-CM | POA: Diagnosis present

## 2017-06-07 DIAGNOSIS — L0291 Cutaneous abscess, unspecified: Secondary | ICD-10-CM | POA: Diagnosis present

## 2017-06-07 DIAGNOSIS — Z8249 Family history of ischemic heart disease and other diseases of the circulatory system: Secondary | ICD-10-CM | POA: Diagnosis not present

## 2017-06-07 DIAGNOSIS — R739 Hyperglycemia, unspecified: Secondary | ICD-10-CM

## 2017-06-07 DIAGNOSIS — Z9119 Patient's noncompliance with other medical treatment and regimen: Secondary | ICD-10-CM | POA: Diagnosis not present

## 2017-06-07 DIAGNOSIS — J45909 Unspecified asthma, uncomplicated: Secondary | ICD-10-CM | POA: Diagnosis present

## 2017-06-07 DIAGNOSIS — Z818 Family history of other mental and behavioral disorders: Secondary | ICD-10-CM

## 2017-06-07 DIAGNOSIS — Z6833 Body mass index (BMI) 33.0-33.9, adult: Secondary | ICD-10-CM

## 2017-06-07 DIAGNOSIS — F319 Bipolar disorder, unspecified: Secondary | ICD-10-CM | POA: Diagnosis present

## 2017-06-07 DIAGNOSIS — F17200 Nicotine dependence, unspecified, uncomplicated: Secondary | ICD-10-CM | POA: Diagnosis not present

## 2017-06-07 DIAGNOSIS — F314 Bipolar disorder, current episode depressed, severe, without psychotic features: Secondary | ICD-10-CM | POA: Diagnosis present

## 2017-06-07 DIAGNOSIS — R748 Abnormal levels of other serum enzymes: Secondary | ICD-10-CM | POA: Diagnosis present

## 2017-06-07 DIAGNOSIS — F419 Anxiety disorder, unspecified: Secondary | ICD-10-CM | POA: Diagnosis present

## 2017-06-07 DIAGNOSIS — Z7984 Long term (current) use of oral hypoglycemic drugs: Secondary | ICD-10-CM | POA: Diagnosis not present

## 2017-06-07 DIAGNOSIS — F209 Schizophrenia, unspecified: Secondary | ICD-10-CM | POA: Diagnosis present

## 2017-06-07 DIAGNOSIS — Z66 Do not resuscitate: Secondary | ICD-10-CM | POA: Diagnosis present

## 2017-06-07 DIAGNOSIS — IMO0002 Reserved for concepts with insufficient information to code with codable children: Secondary | ICD-10-CM

## 2017-06-07 DIAGNOSIS — Z888 Allergy status to other drugs, medicaments and biological substances status: Secondary | ICD-10-CM

## 2017-06-07 DIAGNOSIS — E669 Obesity, unspecified: Secondary | ICD-10-CM | POA: Diagnosis present

## 2017-06-07 DIAGNOSIS — E6609 Other obesity due to excess calories: Secondary | ICD-10-CM | POA: Diagnosis not present

## 2017-06-07 DIAGNOSIS — N764 Abscess of vulva: Principal | ICD-10-CM | POA: Diagnosis present

## 2017-06-07 DIAGNOSIS — F3132 Bipolar disorder, current episode depressed, moderate: Secondary | ICD-10-CM | POA: Diagnosis not present

## 2017-06-07 DIAGNOSIS — G43909 Migraine, unspecified, not intractable, without status migrainosus: Secondary | ICD-10-CM | POA: Diagnosis present

## 2017-06-07 DIAGNOSIS — E1165 Type 2 diabetes mellitus with hyperglycemia: Secondary | ICD-10-CM | POA: Diagnosis present

## 2017-06-07 DIAGNOSIS — Z833 Family history of diabetes mellitus: Secondary | ICD-10-CM

## 2017-06-07 DIAGNOSIS — L02214 Cutaneous abscess of groin: Secondary | ICD-10-CM

## 2017-06-07 LAB — COMPREHENSIVE METABOLIC PANEL
ALT: 16 U/L (ref 14–54)
ANION GAP: 11 (ref 5–15)
AST: 13 U/L — ABNORMAL LOW (ref 15–41)
Albumin: 3.3 g/dL — ABNORMAL LOW (ref 3.5–5.0)
Alkaline Phosphatase: 142 U/L — ABNORMAL HIGH (ref 38–126)
BUN: 15 mg/dL (ref 6–20)
CHLORIDE: 99 mmol/L — AB (ref 101–111)
CO2: 21 mmol/L — ABNORMAL LOW (ref 22–32)
CREATININE: 1 mg/dL (ref 0.44–1.00)
Calcium: 8.5 mg/dL — ABNORMAL LOW (ref 8.9–10.3)
Glucose, Bld: 478 mg/dL — ABNORMAL HIGH (ref 65–99)
POTASSIUM: 4.6 mmol/L (ref 3.5–5.1)
Sodium: 131 mmol/L — ABNORMAL LOW (ref 135–145)
Total Bilirubin: 0.9 mg/dL (ref 0.3–1.2)
Total Protein: 6.5 g/dL (ref 6.5–8.1)

## 2017-06-07 LAB — CBC WITH DIFFERENTIAL/PLATELET
Basophils Absolute: 0.1 10*3/uL (ref 0.0–0.1)
Basophils Relative: 0 %
EOS ABS: 0.2 10*3/uL (ref 0.0–0.7)
EOS PCT: 1 %
HCT: 39.3 % (ref 36.0–46.0)
Hemoglobin: 13.2 g/dL (ref 12.0–15.0)
LYMPHS ABS: 1.6 10*3/uL (ref 0.7–4.0)
LYMPHS PCT: 9 %
MCH: 29.3 pg (ref 26.0–34.0)
MCHC: 33.6 g/dL (ref 30.0–36.0)
MCV: 87.3 fL (ref 78.0–100.0)
MONO ABS: 0.9 10*3/uL (ref 0.1–1.0)
Monocytes Relative: 5 %
Neutro Abs: 14.8 10*3/uL — ABNORMAL HIGH (ref 1.7–7.7)
Neutrophils Relative %: 85 %
PLATELETS: 228 10*3/uL (ref 150–400)
RBC: 4.5 MIL/uL (ref 3.87–5.11)
RDW: 12.9 % (ref 11.5–15.5)
WBC: 17.5 10*3/uL — ABNORMAL HIGH (ref 4.0–10.5)

## 2017-06-07 LAB — GLUCOSE, RANDOM: GLUCOSE: 466 mg/dL — AB (ref 65–99)

## 2017-06-07 LAB — I-STAT CG4 LACTIC ACID, ED
LACTIC ACID, VENOUS: 1.23 mmol/L (ref 0.5–1.9)
LACTIC ACID, VENOUS: 0.91 mmol/L (ref 0.5–1.9)

## 2017-06-07 LAB — GLUCOSE, CAPILLARY: Glucose-Capillary: 451 mg/dL — ABNORMAL HIGH (ref 65–99)

## 2017-06-07 MED ORDER — KETOROLAC TROMETHAMINE 30 MG/ML IJ SOLN
30.0000 mg | Freq: Four times a day (QID) | INTRAMUSCULAR | Status: DC | PRN
  Administered 2017-06-09: 30 mg via INTRAVENOUS
  Filled 2017-06-07: qty 1

## 2017-06-07 MED ORDER — INSULIN ASPART 100 UNIT/ML ~~LOC~~ SOLN
0.0000 [IU] | Freq: Every day | SUBCUTANEOUS | Status: DC
  Administered 2017-06-08: 2 [IU] via SUBCUTANEOUS
  Administered 2017-06-09: 5 [IU] via SUBCUTANEOUS

## 2017-06-07 MED ORDER — INSULIN ASPART 100 UNIT/ML ~~LOC~~ SOLN
18.0000 [IU] | Freq: Once | SUBCUTANEOUS | Status: AC
  Administered 2017-06-07: 18 [IU] via SUBCUTANEOUS

## 2017-06-07 MED ORDER — ACETAMINOPHEN 325 MG PO TABS: 650.0000 mg | ORAL_TABLET | Freq: Four times a day (QID) | ORAL | Status: DC | PRN

## 2017-06-07 MED ORDER — ENOXAPARIN SODIUM 40 MG/0.4ML ~~LOC~~ SOLN
40.0000 mg | SUBCUTANEOUS | Status: DC
  Administered 2017-06-07 – 2017-06-09 (×3): 40 mg via SUBCUTANEOUS
  Filled 2017-06-07 (×3): qty 0.4

## 2017-06-07 MED ORDER — HYDROCODONE-ACETAMINOPHEN 5-325 MG PO TABS
1.0000 | ORAL_TABLET | ORAL | Status: DC | PRN
  Administered 2017-06-07 – 2017-06-10 (×17): 2 via ORAL
  Filled 2017-06-07 (×18): qty 2

## 2017-06-07 MED ORDER — ONDANSETRON HCL 4 MG/2ML IJ SOLN
4.0000 mg | Freq: Once | INTRAMUSCULAR | Status: AC
  Administered 2017-06-07: 4 mg via INTRAVENOUS
  Filled 2017-06-07: qty 2

## 2017-06-07 MED ORDER — ACETAMINOPHEN 650 MG RE SUPP: 650.0000 mg | Freq: Four times a day (QID) | RECTAL | Status: DC | PRN

## 2017-06-07 MED ORDER — HYDROMORPHONE HCL 1 MG/ML IJ SOLN
1.0000 mg | Freq: Once | INTRAMUSCULAR | Status: AC
  Administered 2017-06-07: 1 mg via INTRAVENOUS
  Filled 2017-06-07: qty 1

## 2017-06-07 MED ORDER — ONDANSETRON HCL 4 MG/2ML IJ SOLN
4.0000 mg | Freq: Four times a day (QID) | INTRAMUSCULAR | Status: DC | PRN
  Administered 2017-06-07 – 2017-06-10 (×5): 4 mg via INTRAVENOUS
  Filled 2017-06-07 (×5): qty 2

## 2017-06-07 MED ORDER — ALBUTEROL SULFATE (2.5 MG/3ML) 0.083% IN NEBU: 2.5000 mg | INHALATION_SOLUTION | Freq: Four times a day (QID) | RESPIRATORY_TRACT | Status: DC | PRN

## 2017-06-07 MED ORDER — ESCITALOPRAM OXALATE 10 MG PO TABS
5.0000 mg | ORAL_TABLET | Freq: Every day | ORAL | Status: DC
  Administered 2017-06-07 – 2017-06-10 (×4): 5 mg via ORAL
  Filled 2017-06-07 (×4): qty 1

## 2017-06-07 MED ORDER — INSULIN ASPART 100 UNIT/ML ~~LOC~~ SOLN
0.0000 [IU] | Freq: Three times a day (TID) | SUBCUTANEOUS | Status: DC
  Administered 2017-06-08: 3 [IU] via SUBCUTANEOUS
  Administered 2017-06-08: 8 [IU] via SUBCUTANEOUS
  Administered 2017-06-08: 3 [IU] via SUBCUTANEOUS
  Administered 2017-06-09 (×3): 5 [IU] via SUBCUTANEOUS
  Administered 2017-06-10: 2 [IU] via SUBCUTANEOUS
  Administered 2017-06-10: 5 [IU] via SUBCUTANEOUS

## 2017-06-07 MED ORDER — SODIUM CHLORIDE 0.9 % IV SOLN
INTRAVENOUS | Status: DC
  Administered 2017-06-07 – 2017-06-09 (×4): via INTRAVENOUS

## 2017-06-07 MED ORDER — CLINDAMYCIN PHOSPHATE 600 MG/50ML IV SOLN
600.0000 mg | Freq: Three times a day (TID) | INTRAVENOUS | Status: DC
  Administered 2017-06-08 – 2017-06-09 (×5): 600 mg via INTRAVENOUS
  Filled 2017-06-07 (×6): qty 50

## 2017-06-07 MED ORDER — INSULIN GLARGINE 100 UNIT/ML ~~LOC~~ SOLN
10.0000 [IU] | Freq: Once | SUBCUTANEOUS | Status: AC
  Administered 2017-06-07: 10 [IU] via SUBCUTANEOUS
  Filled 2017-06-07: qty 0.1

## 2017-06-07 MED ORDER — SODIUM CHLORIDE 0.9 % IV SOLN
INTRAVENOUS | Status: DC
  Administered 2017-06-07: 15:00:00 via INTRAVENOUS

## 2017-06-07 MED ORDER — ONDANSETRON HCL 4 MG PO TABS: 4.0000 mg | ORAL_TABLET | Freq: Four times a day (QID) | ORAL | Status: DC | PRN

## 2017-06-07 MED ORDER — INSULIN GLARGINE 100 UNIT/ML ~~LOC~~ SOLN
10.0000 [IU] | Freq: Every day | SUBCUTANEOUS | Status: DC
  Administered 2017-06-07 – 2017-06-08 (×2): 10 [IU] via SUBCUTANEOUS
  Filled 2017-06-07 (×2): qty 0.1

## 2017-06-07 MED ORDER — CLINDAMYCIN PHOSPHATE 600 MG/50ML IV SOLN
600.0000 mg | Freq: Once | INTRAVENOUS | Status: AC
  Administered 2017-06-07: 600 mg via INTRAVENOUS
  Filled 2017-06-07: qty 50

## 2017-06-07 MED ORDER — SODIUM CHLORIDE 0.9 % IV BOLUS (SEPSIS)
1000.0000 mL | Freq: Once | INTRAVENOUS | Status: AC
  Administered 2017-06-07: 1000 mL via INTRAVENOUS

## 2017-06-07 NOTE — Progress Notes (Signed)
POCT glucose 451, Stat lab verification ordered and ON Call notified, orders received and pt medicated as ordered. Pt has had 4 cans of diet soda since 1900, encouraged to drink more water. Pt constantly requesting snacks and additional diet soda. Attempted to provide education to patient regarding elevation of glucose and health related issues. Pt reports "it's always high, but I have to have something to eat.": Sugar free snacks offered which were not acceptable to pat. Pt requested that I contact MD and request an albuterol inhaler "because I use one every night before I go to bed. And I also need some cream for my genital warts that are breaking out." MD made aware of these requests when notified of elevated glucose. Pt constantly squeezing area of labia with abscess, causing increased drainage. Pt also noted to be scratching vaginal area. Requested that instead of scratching areas with her nails that she use warm wet wash clothes to cleanse areas when they are feeling irritated or "itchy". Had another at length discussion with patient regarding why she could not be given snacks of her choice and that she could be given sugar-free snacks, pt responds with "I'll just call my son and have him bring me something in."

## 2017-06-07 NOTE — ED Provider Notes (Signed)
[facility]-EMERGENCY DEPT Provider Note   CSN: [id number] Arrival date & time: 06/07/17  1148     History   Chief Complaint Chief Complaint  Patient presents with  . Abscess    HPI [name] [name] is a 46 y.o. female.  The history is provided by the patient. No language interpreter was used.  Abscess  Location:  Pelvis Pelvic abscess location:  Vagina Size:  14 cm Abscess quality: draining and redness   Red streaking: no   Progression:  Worsening Chronicity:  New Context: diabetes   Relieved by:  Nothing Worsened by:  Nothing Associated symptoms: fever   Pt has a history of diabetes.  She is not taking any medications.  Pt complains of a painful abscess to right suprapubic area. Area began draining today  Past Medical History:  Diagnosis Date  . Anxiety disorder   . Asthma   . Bipolar affective disorder (HCC)   . Depression   . Diabetes mellitus   . Migraine   . Schizophrenia [facility](HCC)     Patient Active Problem List   Diagnosis Date Noted  . Bipolar disorder with moderate depression (HCC) 08/31/2015  . Tobacco use disorder 08/28/2015  . HTN (hypertension) 08/28/2015  . Diabetes (HCC) 08/28/2015  . Hyponatremia 08/28/2015  . DM 10/26/2009  . OBESITY 10/26/2009  . ESSENTIAL HYPERTENSION 10/26/2009  . DISTURBANCE OF SKIN SENSATION 10/26/2009  . MIGRAINE HEADACHE 03/05/2007    Past Surgical History:  Procedure Laterality Date  . KNEE ARTHROSCOPY     x 2  . KNEE SURGERY Left    Incision made and knee cleaned out  . TUBAL LIGATION      OB History    No data available       Home Medications    Prior to Admission medications   Medication Sig Start Date End Date Taking? Authorizing Provider  albuterol (PROVENTIL HFA;VENTOLIN HFA) 108 (90 Base) MCG/ACT inhaler Inhale 2 puffs into the lungs every 6 (six) hours as needed for wheezing or shortness of breath. 01/19/16   Reymundo PollGuilloud, Carolyn, MD  atenolol (TENORMIN) 25 MG tablet Take 1  tablet (25 mg total) by mouth daily. 01/19/16   Reymundo PollGuilloud, Carolyn, MD  clonazePAM (KLONOPIN) 0.5 MG tablet Take 1 tablet (0.5 mg total) by mouth 2 (two) times daily as needed for anxiety. 01/19/16   Reymundo PollGuilloud, Carolyn, MD  doxepin (SINEQUAN) 10 MG capsule Take 2 capsules (20 mg total) by mouth at bedtime. 01/19/16   Reymundo PollGuilloud, Carolyn, MD  gabapentin (NEURONTIN) 400 MG capsule Take 1 capsule (400 mg total) by mouth 3 (three) times daily. 01/19/16   Reymundo PollGuilloud, Carolyn, MD  hydrOXYzine (ATARAX/VISTARIL) 25 MG tablet Take 1 tablet (25 mg total) by mouth every 6 (six) hours as needed for anxiety. 01/19/16   Reymundo PollGuilloud, Carolyn, MD  ibuprofen (ADVIL) 200 MG tablet Take 1 tablet (200 mg total) by mouth every 6 (six) hours as needed. 01/19/16   Reymundo PollGuilloud, Carolyn, MD  lamoTRIgine (LAMICTAL) 25 MG tablet Take 1 tablet (25 mg total) by mouth daily. 01/19/16   Reymundo PollGuilloud, Carolyn, MD  metFORMIN (GLUCOPHAGE) 850 MG tablet Take 1 tablet (850 mg total) by mouth 2 (two) times daily. 01/19/16   Reymundo PollGuilloud, Carolyn, MD  OLANZapine zydis (ZYPREXA) 5 MG disintegrating tablet Take 1 tablet (5 mg total) by mouth at bedtime. 01/19/16   Reymundo PollGuilloud, Carolyn, MD  ondansetron (ZOFRAN) 4 MG tablet Take 1 tablet (4 mg total) by mouth every 8 (eight) hours as needed for nausea or vomiting.  01/19/16   Reymundo Poll, MD  pantoprazole (PROTONIX) 40 MG tablet Take 1 tablet (40 mg total) by mouth 2 (two) times daily before a meal. 01/19/16   Reymundo Poll, MD  traZODone (DESYREL) 50 MG tablet Take 1 tablet (50 mg total) by mouth at bedtime. 01/19/16   Reymundo Poll, MD    Family History Family History  Problem Relation Age of Onset  . Bipolar disorder Mother   . Hypertension Father   . Diabetes Father     Social History Social History   Tobacco Use  . Smoking status: Current Every Day Smoker    Packs/day: 1.00    Years: 29.00    Pack years: 29.00    Types: Cigarettes  . Smokeless tobacco: Never Used  Substance Use Topics  .  Alcohol use: Yes    Comment: occasionally  . Drug use: No     Allergies   Tramadol and Nicotine   Review of Systems Review of Systems  Constitutional: Positive for fever.  All other systems reviewed and are negative.    Physical Exam Updated Vital Signs BP (!) 144/85 (BP Location: Right Arm)   Pulse (!) 133   Temp 100.1 F (37.8 C) (Oral)   Resp 18   Ht 5\' 5"  (1.651 m)   SpO2 96%   BMI 28.29 kg/m   Physical Exam  Constitutional: She appears well-developed and well-nourished. No distress.  HENT:  Head: Normocephalic and atraumatic.  Eyes: Conjunctivae are normal.  Neck: Neck supple.  Cardiovascular: Normal rate and regular rhythm.  No murmur heard. Pulmonary/Chest: Effort normal and breath sounds normal. No respiratory distress.  Abdominal: Soft. There is no tenderness.  Genitourinary:  Genitourinary Comments: 14 cm round red swollen area oozing purulent material.    Musculoskeletal: She exhibits no edema.  Neurological: She is alert.  Skin: Skin is warm and dry.  Psychiatric: She has a normal mood and affect.  Nursing note and vitals reviewed.    ED Treatments / Results  Labs (all labs ordered are listed, but only abnormal results are displayed) Labs Reviewed  CBC WITH DIFFERENTIAL/PLATELET - Abnormal; Notable for the following components:      Result Value   WBC 17.5 (*)    Neutro Abs 14.8 (*)    All other components within normal limits  COMPREHENSIVE METABOLIC PANEL - Abnormal; Notable for the following components:   Sodium 131 (*)    Chloride 99 (*)    CO2 21 (*)    Glucose, Bld 478 (*)    Calcium 8.5 (*)    Albumin 3.3 (*)    AST 13 (*)    Alkaline Phosphatase 142 (*)    All other components within normal limits  I-STAT CG4 LACTIC ACID, ED  I-STAT CG4 LACTIC ACID, ED    EKG  EKG Interpretation None       Radiology No results found.  Procedures Procedures (including critical care time)  Medications Ordered in ED Medications    0.9 %  sodium chloride infusion ( Intravenous New Bag/Given 06/07/17 1525)  clindamycin (CLEOCIN) IVPB 600 mg (not administered)  ondansetron (ZOFRAN) injection 4 mg (4 mg Intravenous Given 06/07/17 1525)  HYDROmorphone (DILAUDID) injection 1 mg (1 mg Intravenous Given 06/07/17 1525)     Initial Impression / Assessment and Plan / ED Course  I have reviewed the triage vital signs and the nursing notes.  Pertinent labs & imaging results that were available during my care of the patient were reviewed by me and  considered in my medical decision making (see chart for details).     I spoke to hospitalist who will admit   Final Clinical Impressions(s) / ED Diagnoses   Final diagnoses:  Abscess  Hyperglycemia    ED Discharge Orders    None       Elson Areas, [place] 06/07/17 1633    Pricilla Loveless, MD 06/07/17 1701

## 2017-06-07 NOTE — H&P (Addendum)
History and Physical    [name] [name] RCV:[id number] DOB: 1971/03/09 DOA: 06/07/2017    PCP: System, Pcp Not In  Patient coming from: home  Chief Complaint: fever, abscess  HPI: [name] [name] is a 46 y.o. female with medical history of uncontrolled DM who presents for a swelling in her groin for the past 6 days and fevers. She does not take medications and has not seen a doctor in 2 yrs because Medicaid will not change the doctor on her card who is in [place]. She is found to have an abscess with has started to drain spontaneously in the ER. She has no other complaints. We have had a discussion about her diabetes and the need to control her sugars. She states that she does not want to believe she is a diabetic and does not check her sugars. She admits to drinking a 2 L bottle of coke every day but otherwise does not eat any sweets. She has blurred vision and suspects this may be related to her diabetes. No c/o increased urination. She states she is always thirsty.   ED Course:  Glucose 478, WBC 17.5 Ordered Lantus and Clindamycin  Review of Systems:  Has gained weight but has not weighed herself.  All other systems reviewed and apart from HPI, are negative.  Past Medical History:  Diagnosis Date  . Anxiety disorder   . Asthma   . Bipolar affective disorder ([place])   . Depression   . Diabetes mellitus   . Migraine   . Schizophrenia [facility])     Past Surgical History:  Procedure Laterality Date  . KNEE ARTHROSCOPY     x 2  . KNEE SURGERY Left    Incision made and knee cleaned out  . TUBAL LIGATION      Social History:   Reports that she has been smoking cigarettes.  Smokes 1/2 ppd. Drinks alcohol occasionally. Is on disability.   Allergies  Allergen Reactions  . Tramadol Nausea Only    "feeling like I'm on fire"  . Nicotine     Patient states she is allergic to the Nicotine patch and nicotine gum and they cause her to break out in a rash.  She states she can smoke  cigarettes.    Family History  Problem Relation Age of Onset  . Bipolar disorder Mother   . Hypertension Father   . Diabetes Father      Prior to Admission medications   Medication Sig Start Date End Date Taking? Authorizing Provider  albuterol (PROVENTIL HFA;VENTOLIN HFA) 108 (90 Base) MCG/ACT inhaler Inhale 2 puffs into the lungs every 6 (six) hours as needed for wheezing or shortness of breath. 01/19/16   Velna Ochs, MD  atenolol (TENORMIN) 25 MG tablet Take 1 tablet (25 mg total) by mouth daily. 01/19/16   Velna Ochs, MD  clonazePAM (KLONOPIN) 0.5 MG tablet Take 1 tablet (0.5 mg total) by mouth 2 (two) times daily as needed for anxiety. 01/19/16   Velna Ochs, MD  doxepin (SINEQUAN) 10 MG capsule Take 2 capsules (20 mg total) by mouth at bedtime. 01/19/16   Velna Ochs, MD  gabapentin (NEURONTIN) 400 MG capsule Take 1 capsule (400 mg total) by mouth 3 (three) times daily. 01/19/16   Velna Ochs, MD  hydrOXYzine (ATARAX/VISTARIL) 25 MG tablet Take 1 tablet (25 mg total) by mouth every 6 (six) hours as needed for anxiety. 01/19/16   Velna Ochs, MD  ibuprofen (ADVIL) 200 MG tablet Take 1 tablet (200 mg  total) by mouth every 6 (six) hours as needed. 01/19/16   Velna Ochs, MD  lamoTRIgine (LAMICTAL) 25 MG tablet Take 1 tablet (25 mg total) by mouth daily. 01/19/16   Velna Ochs, MD  metFORMIN (GLUCOPHAGE) 850 MG tablet Take 1 tablet (850 mg total) by mouth 2 (two) times daily. 01/19/16   Velna Ochs, MD  OLANZapine zydis (ZYPREXA) 5 MG disintegrating tablet Take 1 tablet (5 mg total) by mouth at bedtime. 01/19/16   Velna Ochs, MD  ondansetron (ZOFRAN) 4 MG tablet Take 1 tablet (4 mg total) by mouth every 8 (eight) hours as needed for nausea or vomiting. 01/19/16   Velna Ochs, MD  pantoprazole (PROTONIX) 40 MG tablet Take 1 tablet (40 mg total) by mouth 2 (two) times daily before a meal. 01/19/16   Velna Ochs, MD  traZODone  (DESYREL) 50 MG tablet Take 1 tablet (50 mg total) by mouth at bedtime. 01/19/16   Velna Ochs, MD    Physical Exam: Wt Readings from Last 3 Encounters:  01/19/16 77.1 kg (170 lb)  10/25/15 77.1 kg (170 lb)  08/29/15 85.6 kg (188 lb 11.4 oz)   Vitals:   06/07/17 1229 06/07/17 1241 06/07/17 1420  BP: (!) 148/107  (!) 144/85  Pulse: (!) 132  (!) 133  Resp: 18  18  Temp: 98.4 F (36.9 C)  100.1 F (37.8 C)  TempSrc: Oral  Oral  SpO2: 96%  96%  Height:  '5\' 5"'$  (1.651 m)       Constitutional: NAD, calm, comfortable Eyes: PERTLA, lids and conjunctivae normal ENMT: Mucous membranes are moist. Posterior pharynx clear of any exudate or lesions. Normal dentition.  Neck: normal, supple, no masses, no thyromegaly Respiratory: clear to auscultation bilaterally, no wheezing, no crackles. Normal respiratory effort. No accessory muscle use.  Cardiovascular: S1 & S2 heard, regular rate and rhythm, no murmurs / rubs / gallops. No extremity edema. 2+ pedal pulses. No carotid bruits.  Abdomen: No distension, no tenderness, no masses palpated. No hepatosplenomegaly. Bowel sounds normal.  Musculoskeletal: no clubbing / cyanosis. No joint deformity upper and lower extremities. Good ROM, no contractures. Normal muscle tone.  Skin:  Golf ball sized induration in right vulva with drainage of blood and tenderness.  Neurologic: CN 2-12 grossly intact. Sensation intact, DTR normal. Strength 5/5 in all 4 limbs.  Psychiatric: Normal judgment and insight. Alert and oriented x 3. Normal mood.     Labs on Admission: I have personally reviewed following labs and imaging studies  CBC: Recent Labs  Lab 06/07/17 1500  WBC 17.5*  NEUTROABS 14.8*  HGB 13.2  HCT 39.3  MCV 87.3  PLT 694   Basic Metabolic Panel: Recent Labs  Lab 06/07/17 1500  NA 131*  K 4.6  CL 99*  CO2 21*  GLUCOSE 478*  BUN 15  CREATININE 1.00  CALCIUM 8.5*   GFR: CrCl cannot be calculated (Unknown ideal weight.). Liver  Function Tests: Recent Labs  Lab 06/07/17 1500  AST 13*  ALT 16  ALKPHOS 142*  BILITOT 0.9  PROT 6.5  ALBUMIN 3.3*   No results for input(s): LIPASE, AMYLASE in the last 168 hours. No results for input(s): AMMONIA in the last 168 hours. Coagulation Profile: No results for input(s): INR, PROTIME in the last 168 hours. Cardiac Enzymes: No results for input(s): CKTOTAL, CKMB, CKMBINDEX, TROPONINI in the last 168 hours. BNP (last 3 results) No results for input(s): PROBNP in the last 8760 hours. HbA1C: No results for input(s): HGBA1C in the  last 72 hours. CBG: No results for input(s): GLUCAP in the last 168 hours. Lipid Profile: No results for input(s): CHOL, HDL, LDLCALC, TRIG, CHOLHDL, LDLDIRECT in the last 72 hours. Thyroid Function Tests: No results for input(s): TSH, T4TOTAL, FREET4, T3FREE, THYROIDAB in the last 72 hours. Anemia Panel: No results for input(s): VITAMINB12, FOLATE, FERRITIN, TIBC, IRON, RETICCTPCT in the last 72 hours. Urine analysis:    Component Value Date/Time   COLORURINE YELLOW 10/25/2015 2050   APPEARANCEUR CLOUDY (A) 10/25/2015 2050   LABSPEC 1.006 10/25/2015 2050   PHURINE 6.0 10/25/2015 2050   GLUCOSEU NEGATIVE 10/25/2015 2050   [place] NEGATIVE 10/25/2015 2050   HGBUR trace-intact 11/25/2008 [place] 10/25/2015 2050   [place] 10/25/2015 2050   PROTEINUR NEGATIVE 10/25/2015 2050   UROBILINOGEN 0.2 05/12/2014 2315   NITRITE NEGATIVE 10/25/2015 2050   LEUKOCYTESUR SMALL (A) 10/25/2015 2050   Sepsis Labs: '@LABRCNTIP'$ (procalcitonin:4,lacticidven:4) )No results found for this or any previous visit (from the past 240 hour(s)).   Radiological Exams on Admission: No results found.     Assessment/Plan Principal Problem:   Abscess of groin, right/ fever/ leukocytosis >> sepsis - cont Clindamycin- culture has been sent  - will need to re-assess tomorrow AM - if it continues to drain, it may not need further I and D  but if it stops draining and does not shrink with antibiotics, will need to call gen surgery in AM  Active Problems:     Tobacco use disorder - advised to discontinue she is allergic to the patches    DM (diabetes mellitus), type 2, uncontrolled with mild acidosis - I have had an extensive discussion about complications and the absolute need to be on medications - check A1c - Lantus given today- can continue this with SSI- will likely need Glucophage but will hold on this while she is in the hospital  hyponatermia - likely due to elevated glucose- NS bolus given in ER- cont IVF-  follow, control sugars  Decreased visual acuity - has stopped driving due to this and can no longer go to the doctor in [place] - will need ophthalmology referral  Bipolar disorder - currently not on medications  Obesity - have advised her to change her diet to lose weight  Elevated Alk phos - follow    NOTE: will make case management referral to get in to [place] clinic to help her back on track with medications. She will eventually need case management assistant to help change her PCP as she states she has called to [facility] office > 30 times and has not gotten the issue resolved.    DVT prophylaxis: Lovenox  Code Status: DNR  Family Communication:   Disposition Plan:   Consults called: none  Admission status: inpatient    Debbe Odea MD [facility] Pager: [email or web address] Password [email or web address] 7PM-7AM, please contact night-coverage   06/07/2017, 5:07 PM

## 2017-06-07 NOTE — ED Triage Notes (Signed)
Abscess in vaginal area, increased in size and tender. "size of golfball"

## 2017-06-07 NOTE — ED Provider Notes (Signed)
MSE was initiated and I personally evaluated the patient and placed orders (if any) at  3:51 PM on June 07, 2017. [name] [name] is a 46 y.o. female who presents to the ED for an abscess to the genital area. The pain and swelling started about a week ago and has gotten much worse. Patient reports the abscess is the size of a golf ball and the pain is severe. Patient reports fever up to 103 last night.  Patient reports being a diabetic but has not taken her medication for several months.   Labs, IV, pain management started in [facility].   BP (!) 144/85 (BP Location: Right Arm)   Pulse (!) 133   Temp 100.1 F (37.8 C) (Oral)   Resp 18   Ht 5\' 5"  (1.651 m)   SpO2 96%   BMI 28.29 kg/m    Patient has pain and swelling to the right side pubic area that is tender with palpation.  Elevated WBC and glucose 478. Patient will be moved to the main ED for continued treatment.    The patient appears stable so that the remainder of the MSE may be completed by another provider. Report given to Langston Masker, [facility] @ 4:05 pm.    Kerrie Buffalo [place], [place] 06/07/17 9794    Pricilla Loveless, MD 06/07/17 [phone]

## 2017-06-08 ENCOUNTER — Other Ambulatory Visit: Payer: Self-pay

## 2017-06-08 DIAGNOSIS — F3132 Bipolar disorder, current episode depressed, moderate: Secondary | ICD-10-CM

## 2017-06-08 DIAGNOSIS — E1165 Type 2 diabetes mellitus with hyperglycemia: Secondary | ICD-10-CM

## 2017-06-08 DIAGNOSIS — F17200 Nicotine dependence, unspecified, uncomplicated: Secondary | ICD-10-CM

## 2017-06-08 DIAGNOSIS — L02214 Cutaneous abscess of groin: Secondary | ICD-10-CM

## 2017-06-08 DIAGNOSIS — R739 Hyperglycemia, unspecified: Secondary | ICD-10-CM

## 2017-06-08 LAB — BASIC METABOLIC PANEL
ANION GAP: 8 (ref 5–15)
BUN: 14 mg/dL (ref 6–20)
CALCIUM: 7.7 mg/dL — AB (ref 8.9–10.3)
CO2: 23 mmol/L (ref 22–32)
Chloride: 104 mmol/L (ref 101–111)
Creatinine, Ser: 0.8 mg/dL (ref 0.44–1.00)
Glucose, Bld: 151 mg/dL — ABNORMAL HIGH (ref 65–99)
Potassium: 3.6 mmol/L (ref 3.5–5.1)
Sodium: 135 mmol/L (ref 135–145)

## 2017-06-08 LAB — CBC
HCT: 35 % — ABNORMAL LOW (ref 36.0–46.0)
HEMOGLOBIN: 11.4 g/dL — AB (ref 12.0–15.0)
MCH: 28.9 pg (ref 26.0–34.0)
MCHC: 32.6 g/dL (ref 30.0–36.0)
MCV: 88.8 fL (ref 78.0–100.0)
Platelets: 222 10*3/uL (ref 150–400)
RBC: 3.94 MIL/uL (ref 3.87–5.11)
RDW: 12.7 % (ref 11.5–15.5)
WBC: 11.6 10*3/uL — ABNORMAL HIGH (ref 4.0–10.5)

## 2017-06-08 LAB — GLUCOSE, CAPILLARY
GLUCOSE-CAPILLARY: 151 mg/dL — AB (ref 65–99)
Glucose-Capillary: 193 mg/dL — ABNORMAL HIGH (ref 65–99)
Glucose-Capillary: 253 mg/dL — ABNORMAL HIGH (ref 65–99)
Glucose-Capillary: 224 mg/dL — ABNORMAL HIGH (ref 65–99)

## 2017-06-08 LAB — HIV ANTIBODY (ROUTINE TESTING W REFLEX): HIV Screen 4th Generation wRfx: NONREACTIVE

## 2017-06-08 LAB — HEMOGLOBIN A1C
HEMOGLOBIN A1C: 12.9 % — AB (ref 4.8–5.6)
Mean Plasma Glucose: 323.53 mg/dL

## 2017-06-08 MED ORDER — INSULIN STARTER KIT- PEN NEEDLES (ENGLISH)
1.0000 | Freq: Once | Status: AC
  Administered 2017-06-08: 1
  Filled 2017-06-08: qty 1

## 2017-06-08 MED ORDER — PROMETHAZINE HCL 25 MG PO TABS
12.5000 mg | ORAL_TABLET | Freq: Four times a day (QID) | ORAL | Status: DC | PRN
Start: 2017-06-08 — End: 2017-06-10
  Filled 2017-06-08: qty 1

## 2017-06-08 MED ORDER — LIVING WELL WITH DIABETES BOOK
Freq: Once | Status: DC
Start: 2017-06-08 — End: 2017-06-10
  Filled 2017-06-08: qty 1

## 2017-06-08 NOTE — Progress Notes (Signed)
Inpatient Diabetes Program Recommendations  AACE/ADA: New Consensus Statement on Inpatient Glycemic Control (2015)  Target Ranges:  Prepandial:   less than 140 mg/dL      Peak postprandial:   less than 180 mg/dL (1-2 hours)      Critically ill patients:  140 - 180 mg/dL   Lab Results  Component Value Date   GLUCAP 253 (H) 06/08/2017   HGBA1C 12.9 (H) 06/07/2017    Review of Glycemic Control  Diabetes history: DM2 Outpatient Diabetes medications: metformin 850 mg bid (not taking) Current orders for Inpatient glycemic control: Lantus 10 units QHS, Novolog 0-15 units tidwc and hs  12.9% HgbA1C indicating poor glycemic control at home.   Inpatient Diabetes Program Recommendations:     Add Novolog 3 units tidwc for meal coverage insulin. Increase Lantus to 15 units QHS  Will speak with pt in am regarding importance of glucose control and f/u with PCP for diabetes management.  Thank you. Ailene Ards, RD, LDN, CDE Inpatient Diabetes Coordinator [phone]

## 2017-06-08 NOTE — Consult Note (Signed)
Reason for Consult: Vulvar abscess Referring Physician: Asiya Cutbirth is an 46 y.o. female.  HPI: I was asked to evaluate this patient for a vulvar abscess.  She is a noncompliant diabetic.  Has not been on recent diabetic medications.  She states that for about 1 week she has had progressive swelling and pain in her right vulva.  This progressed to the point where she presented to the emergency room.  She denies fever chills.  She states she did not have any drainage until she was in the emergency room at the time of admission yesterday when she did have some spontaneous thin drainage.  Patient was admitted and placed on appropriate IV antibiotics.  She states that today it is dramatically better, about one half the size it was at presentation and feels significantly better although still some discomfort and clearly not resolved.  No history of similar infections in that area although has had some axillary skin infections.  Past Medical History:  Diagnosis Date  . Anxiety disorder   . Asthma   . Bipolar affective disorder ([place])   . Depression   . Diabetes mellitus   . Migraine   . Schizophrenia [facility])     Past Surgical History:  Procedure Laterality Date  . KNEE ARTHROSCOPY     x 2  . KNEE SURGERY Left    Incision made and knee cleaned out  . TUBAL LIGATION      Family History  Problem Relation Age of Onset  . Bipolar disorder Mother   . Hypertension Father   . Diabetes Father     Social History:  reports that she has been smoking cigarettes.  She has a 29.00 pack-year smoking history. she has never used smokeless tobacco. She reports that she drinks alcohol. She reports that she does not use drugs.  Allergies:  Allergies  Allergen Reactions  . Tramadol Nausea Only    "feeling like I'm on fire"  . Nicotine     Patient states she is allergic to the Nicotine patch and nicotine gum and they cause her to break out in a rash.  She states she can smoke cigarettes.    Current Facility-Administered Medications  Medication Dose Route Frequency Provider Last Rate Last Dose  . 0.9 %  sodium chloride infusion   Intravenous Continuous Debroah Baller M, NP 125 mL/hr at 06/07/17 1525    . 0.9 %  sodium chloride infusion   Intravenous Continuous Debbe Odea, MD 75 mL/hr at 06/08/17 0748    . acetaminophen (TYLENOL) tablet 650 mg  650 mg Oral Q6H PRN Debbe Odea, MD       Or  . acetaminophen (TYLENOL) suppository 650 mg  650 mg Rectal Q6H PRN Rizwan, Saima, MD      . albuterol (PROVENTIL) (2.5 MG/3ML) 0.083% nebulizer solution 2.5 mg  2.5 mg Inhalation Q6H PRN Bodenheimer, Charles A, NP      . clindamycin (CLEOCIN) IVPB 600 mg  600 mg Intravenous Q8H Debbe Odea, MD   Stopped at 06/08/17 1007  . enoxaparin (LOVENOX) injection 40 mg  40 mg Subcutaneous Q24H Debbe Odea, MD   40 mg at 06/07/17 2117  . escitalopram (LEXAPRO) tablet 5 mg  5 mg Oral Daily Debbe Odea, MD   5 mg at 06/08/17 0943  . HYDROcodone-acetaminophen (NORCO/VICODIN) 5-325 MG per tablet 1-2 tablet  1-2 tablet Oral Q4H PRN Debbe Odea, MD   2 tablet at 06/08/17 1122  . insulin aspart (novoLOG) injection 0-15 Units  0-15 Units Subcutaneous TID WC Debbe Odea, MD   3 Units at 06/08/17 1214  . insulin aspart (novoLOG) injection 0-5 Units  0-5 Units Subcutaneous QHS Rizwan, Saima, MD      . insulin glargine (LANTUS) injection 10 Units  10 Units Subcutaneous QHS Debbe Odea, MD   10 Units at 06/07/17 2116  . ketorolac (TORADOL) 30 MG/ML injection 30 mg  30 mg Intravenous Q6H PRN Rizwan, Eunice Blase, MD      . ondansetron (ZOFRAN) tablet 4 mg  4 mg Oral Q6H PRN Debbe Odea, MD       Or  . ondansetron (ZOFRAN) injection 4 mg  4 mg Intravenous Q6H PRN Debbe Odea, MD   4 mg at 06/08/17 0749  . promethazine (PHENERGAN) tablet 12.5 mg  12.5 mg Oral Q6H PRN Patrecia Pour, MD       Recent Results (from the past 240 hour(s))  Wound or Superficial Culture     Status: None (Preliminary result)    Collection Time: 06/07/17  4:31 PM  Result Value Ref Range Status   Specimen Description ABSCESS  Final   Special Requests NONE  Final   Gram Stain   Final    ABUNDANT WBC PRESENT, PREDOMINANTLY PMN ABUNDANT GRAM POSITIVE COCCI Performed at [facility], [address], [place], [place] [address]    Culture PENDING  Incomplete   Report Status PENDING  Incomplete    Results for orders placed or performed during the hospital encounter of 06/07/17 (from the past 48 hour(s))  CBC with Differential/Platelet     Status: Abnormal   Collection Time: 06/07/17  3:00 PM  Result Value Ref Range   WBC 17.5 (H) 4.0 - 10.5 K/uL   RBC 4.50 3.87 - 5.11 MIL/uL   Hemoglobin 13.2 12.0 - 15.0 g/dL   HCT 39.3 36.0 - 46.0 %   MCV 87.3 78.0 - 100.0 fL   MCH 29.3 26.0 - 34.0 pg   MCHC 33.6 30.0 - 36.0 g/dL   RDW 12.9 11.5 - 15.5 %   Platelets 228 150 - 400 K/uL   Neutrophils Relative % 85 %   Neutro Abs 14.8 (H) 1.7 - 7.7 K/uL   Lymphocytes Relative 9 %   Lymphs Abs 1.6 0.7 - 4.0 K/uL   Monocytes Relative 5 %   Monocytes Absolute 0.9 0.1 - 1.0 K/uL   Eosinophils Relative 1 %   Eosinophils Absolute 0.2 0.0 - 0.7 K/uL   Basophils Relative 0 %   Basophils Absolute 0.1 0.0 - 0.1 K/uL  Comprehensive metabolic panel     Status: Abnormal   Collection Time: 06/07/17  3:00 PM  Result Value Ref Range   Sodium 131 (L) 135 - 145 mmol/L   Potassium 4.6 3.5 - 5.1 mmol/L   Chloride 99 (L) 101 - 111 mmol/L   CO2 21 (L) 22 - 32 mmol/L   Glucose, Bld 478 (H) 65 - 99 mg/dL   BUN 15 6 - 20 mg/dL   Creatinine, Ser 1.00 0.44 - 1.00 mg/dL   Calcium 8.5 (L) 8.9 - 10.3 mg/dL   Total Protein 6.5 6.5 - 8.1 g/dL   Albumin 3.3 (L) 3.5 - 5.0 g/dL   AST 13 (L) 15 - 41 U/L   ALT 16 14 - 54 U/L   Alkaline Phosphatase 142 (H) 38 - 126 U/L   Total Bilirubin 0.9 0.3 - 1.2 mg/dL   GFR calc non Af Amer >60 >60 mL/min   GFR calc Af  Amer >60 >60 mL/min    Comment: (NOTE) The eGFR has been calculated using the CKD EPI  equation. This calculation has not been validated in all clinical situations. eGFR's persistently <60 mL/min signify possible Chronic Kidney Disease.    Anion gap 11 5 - 15  I-Stat CG4 Lactic Acid, ED     Status: None   Collection Time: 06/07/17  3:21 PM  Result Value Ref Range   Lactic Acid, Venous 1.23 0.5 - 1.9 mmol/L  Wound or Superficial Culture     Status: None (Preliminary result)   Collection Time: 06/07/17  4:31 PM  Result Value Ref Range   Specimen Description ABSCESS    Special Requests NONE    Gram Stain      ABUNDANT WBC PRESENT, PREDOMINANTLY PMN ABUNDANT GRAM POSITIVE COCCI Performed at [facility], [place] [address], [place], [place] [address]    Culture PENDING    Report Status PENDING   I-Stat CG4 Lactic Acid, ED     Status: None   Collection Time: 06/07/17  4:55 PM  Result Value Ref Range   Lactic Acid, Venous 0.91 0.5 - 1.9 mmol/L  Hemoglobin A1c     Status: Abnormal   Collection Time: 06/07/17  5:16 PM  Result Value Ref Range   Hgb A1c MFr Bld 12.9 (H) 4.8 - 5.6 %    Comment: (NOTE) Pre diabetes:          5.7%-6.4% Diabetes:              >6.4% Glycemic control for   <7.0% adults with diabetes    Mean Plasma Glucose 323.53 mg/dL    Comment: Performed at [facility] [address], [place], [place] [address]  Glucose, capillary     Status: Abnormal   Collection Time: 06/07/17  8:07 PM  Result Value Ref Range   Glucose-Capillary 451 (H) 65 - 99 mg/dL  Glucose, random     Status: Abnormal   Collection Time: 06/07/17  8:22 PM  Result Value Ref Range   Glucose, Bld 466 (H) 65 - 99 mg/dL  Basic metabolic panel     Status: Abnormal   Collection Time: 06/08/17  5:22 AM  Result Value Ref Range   Sodium 135 135 - 145 mmol/L   Potassium 3.6 3.5 - 5.1 mmol/L    Comment: DELTA CHECK NOTED   Chloride 104 101 - 111 mmol/L   CO2 23 22 - 32 mmol/L   Glucose, Bld 151 (H) 65 - 99 mg/dL   BUN 14 6 - 20 mg/dL   Creatinine, Ser 0.80 0.44 - 1.00 mg/dL    Calcium 7.7 (L) 8.9 - 10.3 mg/dL   GFR calc non Af Amer >60 >60 mL/min   GFR calc Af Amer >60 >60 mL/min    Comment: (NOTE) The eGFR has been calculated using the CKD EPI equation. This calculation has not been validated in all clinical situations. eGFR's persistently <60 mL/min signify possible Chronic Kidney Disease.    Anion gap 8 5 - 15  CBC     Status: Abnormal   Collection Time: 06/08/17  5:22 AM  Result Value Ref Range   WBC 11.6 (H) 4.0 - 10.5 K/uL   RBC 3.94 3.87 - 5.11 MIL/uL   Hemoglobin 11.4 (L) 12.0 - 15.0 g/dL   HCT 35.0 (L) 36.0 - 46.0 %   MCV 88.8 78.0 - 100.0 fL   MCH 28.9 26.0 - 34.0 pg   MCHC 32.6 30.0 -  36.0 g/dL   RDW 12.7 11.5 - 15.5 %   Platelets 222 150 - 400 K/uL  Glucose, capillary     Status: Abnormal   Collection Time: 06/08/17  7:35 AM  Result Value Ref Range   Glucose-Capillary 151 (H) 65 - 99 mg/dL  Glucose, capillary     Status: Abnormal   Collection Time: 06/08/17 11:54 AM  Result Value Ref Range   Glucose-Capillary 193 (H) 65 - 99 mg/dL     ROS Blood pressure 127/85, pulse 86, temperature 98 F (36.7 C), temperature source Oral, resp. rate 18, height '5\' 5"'$  (1.651 m), SpO2 97 %. Physical Exam General: Alert, moderately obese Caucasian female, in no distress Skin: Generally warm and dry.  On the right mons/vulva is an approximately 4 cm area of firm induration and erythema.  There is a punctate skin opening 2-3 mm overlying this with some cloudy serous drainage on pressure. Lungs: No wheezing or increased work of breathing. Extremities: No edema or joint swelling or deformity. No chronic venous stasis changes. Neurologic: Alert and fully oriented.  Affect appropriate.  No motor deficits.  Assessment/Plan: Right-sided vulvar abscess/cellulitis in noncompliant diabetic female.  I was able to explore the open area extensively with a cotton tipped applicator and there is an approximately 1-1/2-2 cm cavity which actually is relatively well  drained through the small skin opening.  The surrounding area feels like firm induration and nothing drainable.  With this finding and by the fact she says it is dramatically better than yesterday and white blood count decreasing I would not recommend surgical drainage at this time.  I have ordered some iodoform gauze to the bedside which we can hopefully use to keep the skin opening patent and draining.  If it is not improving we could perform bedside I&D under local anesthesia but I am not sure that even this will be necessary. Will follow with you.  Darene Lamer Danell Vazquez 06/08/2017, 12:17 PM

## 2017-06-08 NOTE — Progress Notes (Signed)
PROGRESS NOTE  [name] [name]  EAV:[id number]:[id number] DOB: May 29, 1971 DOA: 06/07/2017 PCP: None ([place]icaid [place] in [place]) Brief Narrative: [name] [name] is a 46 y.o. female with a history of untreated DM and no medical follow up who presented to the ED with 6 days of fevers and boil on the right groin/vulva that began draining serosanguinous discharge in the ED. Blood sugar was grossly elevated, WBC 17.5. Clindamycin and lantus were given. Leukocytosis and CBGs have improved and the abscess has continued to have serosanguinous discharge without purulence.   Assessment & Plan: Principal Problem:   Abscess of groin, right Active Problems:   OBESITY   Tobacco use disorder   Bipolar disorder with moderate depression (HCC)   DM (diabetes mellitus), type 2, [name]   Abscess  Right vulvar abscess: With some fluctuance on exam. Noted to be draining serosanguinous fluid but not actually pus on my exam.  - Continue clindamycin  - Will ask for general surgery evaluation, consideration of I&D - No blood cultures drawn. Superficial culture taken at admission unlikely to be helpful  Uncontrolled T2DM: HbA1c 12.9%, indicating severe chronic hyperglycemia. Pt in state of denial regarding DX diabetes. Improved CBGs since admission.  - Monitor CBGs on lantus 10u qHS and mod SSI, HS correction.  - Frequent counseling by MD and RN staff. Pt to self-administer insulin while inpatient.  - Diabetes coordinator and CM consulted as she will need reliable PCP follow up and access to insulin at discharge.  - Plan to start metformin when we're sure no contrast will be needed - Due to poor adherence, will not add statin, ASA, ACE at discharge, would start as outpatient if she follows up.   Vaginal irritation and discharge:  - Self swab for wet prep, GC/Chl NAAT - HIV recheck (NR in 2013)  Bipolar disorder: Not in acute mania and not taking any medications.  - Admitting MD started SSRI. Will  monitor closely.  - Recommend outpatient psychiatry follow up.   Alkaline phosphatase elevation: Very mild without other cholestatic pattern.  - Recheck toward end of admission  DVT prophylaxis: Lovenox Code Status: DNR per pt Family Communication: None at bedside Disposition Plan: Home once glucose stable, educated regarding diabetes, and abscess improving.   Consultants:   General surgery  Procedures:   None  Antimicrobials:  Clindamycin 12/29 >>    Subjective: Denies pus from area, very tender improved with norco. Having nausea and vomiting, threw up breakfast despite reportedly taking zofran. No current fever.   Objective: Vitals:   06/07/17 1700 06/07/17 1735 06/07/17 2228 06/08/17 0540  BP: (!) 162/106 125/88 (!) 142/83 127/85  Pulse: (!) 113 (!) 114 83 86  Resp:  18 18 18   Temp:   98.8 F (37.1 C) 98 F (36.7 C)  TempSrc:   Oral Oral  SpO2:  94% 97% 97%  Height:   5\' 5"  (1.651 m)     Intake/Output Summary (Last 24 hours) at 06/08/2017 1038 Last data filed at 06/08/2017 0656 Gross per 24 hour  Intake 960 ml  Output 4 ml  Net 956 ml   There were no vitals filed for this visit.  Gen: 46 y.o. female in no distress Pulm: Non-labored breathing room air. Clear to auscultation bilaterally.  CV: Regular rate and rhythm. No murmur, rub, or gallop. No JVD, no pedal edema. GI: Abdomen soft, non-tender, non-distended, with normoactive bowel sounds. No organomegaly or masses felt. GU: Clear grey vaginal discharge without condyloma or ulceration. Introitus  with excoriation.  Skin: Right vulva with > golf ball sized induration with central fluctuance, superficial punctum draining clear, blood tinged fluid without purulence expressed with compression of induration. No significant inguinal lymphadenopathy.  Ext: Warm, no deformities Neuro: Alert and oriented. No focal neurological deficits. Psych: Judgement and insight appear normal. Mood & affect appropriate.   Data  Reviewed: I have personally reviewed following labs and imaging studies  CBC: Recent Labs  Lab 06/07/17 1500 06/08/17 0522  WBC 17.5* 11.6*  NEUTROABS 14.8*  --   HGB 13.2 11.4*  HCT 39.3 35.0*  MCV 87.3 88.8  PLT 228 222   Basic Metabolic Panel: Recent Labs  Lab 06/07/17 1500 06/07/17 2022 06/08/17 0522  NA 131*  --  135  K 4.6  --  3.6  CL 99*  --  104  CO2 21*  --  23  GLUCOSE 478* 466* 151*  BUN 15  --  14  CREATININE 1.00  --  0.80  CALCIUM 8.5*  --  7.7*   GFR: CrCl cannot be calculated (Unknown ideal weight.). Liver Function Tests: Recent Labs  Lab 06/07/17 1500  AST 13*  ALT 16  ALKPHOS 142*  BILITOT 0.9  PROT 6.5  ALBUMIN 3.3*   No results for input(s): LIPASE, AMYLASE in the last 168 hours. No results for input(s): AMMONIA in the last 168 hours. Coagulation Profile: No results for input(s): INR, PROTIME in the last 168 hours. Cardiac Enzymes: No results for input(s): CKTOTAL, CKMB, CKMBINDEX, TROPONINI in the last 168 hours. BNP (last 3 results) No results for input(s): PROBNP in the last 8760 hours. HbA1C: Recent Labs    06/07/17 1716  HGBA1C 12.9*   CBG: Recent Labs  Lab 06/07/17 2007 06/08/17 0735  GLUCAP 451* 151*   Lipid Profile: No results for input(s): CHOL, HDL, LDLCALC, TRIG, CHOLHDL, LDLDIRECT in the last 72 hours. Thyroid Function Tests: No results for input(s): TSH, T4TOTAL, FREET4, T3FREE, THYROIDAB in the last 72 hours. Anemia Panel: No results for input(s): VITAMINB12, FOLATE, FERRITIN, TIBC, IRON, RETICCTPCT in the last 72 hours. Urine analysis:    Component Value Date/Time   COLORURINE YELLOW 10/25/2015 2050   APPEARANCEUR CLOUDY (A) 10/25/2015 2050   LABSPEC 1.006 10/25/2015 2050   PHURINE 6.0 10/25/2015 2050   GLUCOSEU NEGATIVE 10/25/2015 2050   HGBUR NEGATIVE 10/25/2015 2050   HGBUR trace-intact 11/25/2008 1530   BILIRUBINUR NEGATIVE 10/25/2015 2050   KETONESUR NEGATIVE 10/25/2015 2050   PROTEINUR NEGATIVE  10/25/2015 2050   UROBILINOGEN 0.2 05/12/2014 2315   NITRITE NEGATIVE 10/25/2015 2050   LEUKOCYTESUR SMALL (A) 10/25/2015 2050   Recent Results (from the past 240 hour(s))  Wound or Superficial Culture     Status: None (Preliminary result)   Collection Time: 06/07/17  4:31 PM  Result Value Ref Range Status   Specimen Description ABSCESS  Final   Special Requests NONE  Final   Gram Stain   Final    ABUNDANT WBC PRESENT, PREDOMINANTLY PMN ABUNDANT GRAM POSITIVE COCCI Performed at [facility], [address], [place], [place] [address]    Culture PENDING  Incomplete   Report Status PENDING  Incomplete      Radiology Studies: No results found.  Scheduled Meds: . enoxaparin (LOVENOX) injection  40 mg Subcutaneous Q24H  . escitalopram  5 mg Oral Daily  . insulin aspart  0-15 Units Subcutaneous TID WC  . insulin aspart  0-5 Units Subcutaneous QHS  . insulin glargine  10 Units Subcutaneous QHS  Continuous Infusions: . sodium chloride 125 mL/hr at 06/07/17 1525  . sodium chloride 75 mL/hr at 06/08/17 0748  . clindamycin (CLEOCIN) IV Stopped (06/08/17 1007)     LOS: 1 day   Time spent: 25 minutes.  Hazeline Junker, MD [facility] Pager [phone]  If 7PM-7AM, please contact night-coverage [email or web address] Password [email or web address] 06/08/2017, 10:38 AM

## 2017-06-09 LAB — BASIC METABOLIC PANEL
Anion gap: 7 (ref 5–15)
BUN: 9 mg/dL (ref 6–20)
CALCIUM: 7.9 mg/dL — AB (ref 8.9–10.3)
CO2: 23 mmol/L (ref 22–32)
CREATININE: 0.68 mg/dL (ref 0.44–1.00)
Chloride: 105 mmol/L (ref 101–111)
GFR calc Af Amer: >60 mL/min (ref >60)
Glucose, Bld: 278 mg/dL — ABNORMAL HIGH (ref 65–99)
POTASSIUM: 4.1 mmol/L (ref 3.5–5.1)
SODIUM: 135 mmol/L (ref 135–145)

## 2017-06-09 LAB — CBC
HCT: 37.6 % (ref 36.0–46.0)
HEMOGLOBIN: 12.4 g/dL (ref 12.0–15.0)
MCH: 29.3 pg (ref 26.0–34.0)
MCHC: 33 g/dL (ref 30.0–36.0)
MCV: 88.9 fL (ref 78.0–100.0)
Platelets: 251 10*3/uL (ref 150–400)
RBC: 4.23 MIL/uL (ref 3.87–5.11)
RDW: 12.9 % (ref 11.5–15.5)
WBC: 7.9 10*3/uL (ref 4.0–10.5)

## 2017-06-09 LAB — AEROBIC CULTURE  (SUPERFICIAL SPECIMEN)

## 2017-06-09 LAB — GLUCOSE, CAPILLARY
GLUCOSE-CAPILLARY: 205 mg/dL — AB (ref 65–99)
GLUCOSE-CAPILLARY: 317 mg/dL — AB (ref 65–99)
Glucose-Capillary: 249 mg/dL — ABNORMAL HIGH (ref 65–99)

## 2017-06-09 LAB — AEROBIC CULTURE W GRAM STAIN (SUPERFICIAL SPECIMEN)

## 2017-06-09 LAB — HIV ANTIBODY (ROUTINE TESTING W REFLEX): HIV SCREEN 4TH GENERATION: NONREACTIVE

## 2017-06-09 MED ORDER — INSULIN GLARGINE 100 UNIT/ML ~~LOC~~ SOLN
15.0000 [IU] | Freq: Every day | SUBCUTANEOUS | Status: DC
  Administered 2017-06-09: 15 [IU] via SUBCUTANEOUS
  Filled 2017-06-09 (×3): qty 0.15

## 2017-06-09 MED ORDER — FLUCONAZOLE 150 MG PO TABS
150.0000 mg | ORAL_TABLET | Freq: Once | ORAL | Status: AC
  Administered 2017-06-09: 150 mg via ORAL
  Filled 2017-06-09: qty 1

## 2017-06-09 MED ORDER — METFORMIN HCL ER 500 MG PO TB24
500.0000 mg | ORAL_TABLET | Freq: Every day | ORAL | Status: DC
  Administered 2017-06-09 – 2017-06-10 (×2): 500 mg via ORAL
  Filled 2017-06-09 (×2): qty 1

## 2017-06-09 MED ORDER — CLINDAMYCIN HCL 300 MG PO CAPS
600.0000 mg | ORAL_CAPSULE | Freq: Three times a day (TID) | ORAL | Status: DC
  Administered 2017-06-09 – 2017-06-10 (×3): 600 mg via ORAL
  Filled 2017-06-09 (×4): qty 2

## 2017-06-09 MED ORDER — INSULIN ASPART 100 UNIT/ML ~~LOC~~ SOLN
3.0000 [IU] | Freq: Three times a day (TID) | SUBCUTANEOUS | Status: DC
  Administered 2017-06-09 – 2017-06-10 (×5): 3 [IU] via SUBCUTANEOUS

## 2017-06-09 MED ORDER — LISINOPRIL 5 MG PO TABS
5.0000 mg | ORAL_TABLET | Freq: Every day | ORAL | Status: DC
  Administered 2017-06-09 – 2017-06-10 (×2): 5 mg via ORAL
  Filled 2017-06-09 (×2): qty 1

## 2017-06-09 NOTE — Progress Notes (Signed)
PROGRESS NOTE  [name] [name]  DXI:[id number] DOB: 18-Apr-1971 DOA: 06/07/2017 PCP: None (PCP on medicaid card in [place]) Brief Narrative: [name] [name] is a 46 y.o. female with a history of untreated DM and no medical follow up who presented to the ED with 6 days of fevers and boil on the right groin/vulva that began draining serosanguinous discharge in the ED. Blood sugar was grossly elevated, WBC 17.5. Clindamycin and lantus were given. Leukocytosis and CBGs have improved and the abscess has continued to have discharge.   Assessment & Plan: Principal Problem:   Abscess of groin, right Active Problems:   OBESITY   Tobacco use disorder   Bipolar disorder with moderate depression (HCC)   DM (diabetes mellitus), type 2, [name]   Abscess  Right vulvar abscess: Spontaneously draining. - Continue clindamycin, transition to po  - Continue iodoform to ensure drainage. Appreciate surgical consult.  - No blood cultures drawn. Superficial culture showing staph aureus sensitive to clindamycin.   Uncontrolled T2DM: HbA1c 12.9%, indicating severe chronic hyperglycemia. Pt in state of denial regarding Dx diabetes. Improved CBGs since admission.  - Monitor CBGs, increase lantus 10u > 15u qHS and add 3u TIDAC in addition to mod SSI, HS correction.  - Frequent counseling by MD and RN staff. Pt to self-administer insulin while inpatient.  - Diabetes coordinator and CM consulted as she will need reliable PCP follow up and access to insulin at discharge.  - Start metformin. Add lisinopril as BP is modestly elevated.    Vaginal irritation and discharge: HIV NR.  - Self swab for wet prep, GC/Chl NAAT, recollect as previous sample was in wrong tube. - Empirically treat w/diflucan x1 since on abx.  Bipolar disorder: Not in acute mania and not taking any medications.  - Admitting MD started SSRI which I agree with. Will monitor closely.  - Recommend outpatient psychiatry follow up.    Alkaline phosphatase elevation: Very mild without other cholestatic pattern.  - Recheck toward end of admission  DVT prophylaxis: Lovenox Code Status: DNR per pt Family Communication: None at bedside Disposition Plan: Home once glucose stable, educated regarding diabetes. Likely 06/10/17.   Consultants:   General surgery  Procedures:   Iodoform gauze placed 12/31.  Antimicrobials:  Clindamycin 12/29 >>    Subjective: No fevers, chills. Pain controlled. Vaginal irritation continues. No N/V/D.   Objective: BP (!) 158/90 (BP Location: Right Arm)   Pulse 86   Temp 98.6 F (37 C) (Oral)   Resp 18   Ht 5\' 5"  (1.651 m)   Wt 92.3 kg (203 lb 7.8 oz)   SpO2 98%   BMI 33.86 kg/m   Gen: 46 y.o. female in no distress Pulm: Non-labored breathing room air. Clear to auscultation bilaterally.  CV: Regular rate and rhythm. No murmur, rub, or gallop. No JVD, no pedal edema. GI: Abdomen soft, non-tender, non-distended, with normoactive bowel sounds. No organomegaly or masses felt. Skin: Subcentimeter wound w/iodoform wick in place and surrounding induration. Ext: Warm, no deformities Neuro: Alert and oriented. No focal neurological deficits. Psych: Judgement and insight appear normal. Mood depressed but affect broad, not neurovegetative.   CBC: Recent Labs  Lab 06/07/17 1500 06/08/17 0522 06/09/17 0433  WBC 17.5* 11.6* 7.9  NEUTROABS 14.8*  --   --   HGB 13.2 11.4* 12.4  HCT 39.3 35.0* 37.6  MCV 87.3 88.8 88.9  PLT 228 222 251   Basic Metabolic Panel: Recent Labs  Lab 06/07/17 1500 06/07/17 2022 06/08/17 0522  06/09/17 0433  NA 131*  --  135 135  K 4.6  --  3.6 4.1  CL 99*  --  104 105  CO2 21*  --  23 23  GLUCOSE 478* 466* 151* 278*  BUN 15  --  14 9  CREATININE 1.00  --  0.80 0.68  CALCIUM 8.5*  --  7.7* 7.9*   GFR: Estimated Creatinine Clearance: 97.6 mL/min (by C-G formula based on SCr of 0.68 mg/dL). Liver Function Tests: Recent Labs  Lab 06/07/17 1500   AST 13*  ALT 16  ALKPHOS 142*  BILITOT 0.9  PROT 6.5  ALBUMIN 3.3*   HbA1C: Recent Labs    06/07/17 1716  HGBA1C 12.9*   CBG: Recent Labs  Lab 06/08/17 0735 06/08/17 1154 06/08/17 1603 06/08/17 2136 06/09/17 1148  GLUCAP 151* 193* 253* 224* 205*   Urine analysis:    Component Value Date/Time   COLORURINE YELLOW 10/25/2015 2050   APPEARANCEUR CLOUDY (A) 10/25/2015 2050   LABSPEC 1.006 10/25/2015 2050   PHURINE 6.0 10/25/2015 2050   GLUCOSEU NEGATIVE 10/25/2015 2050   HGBUR NEGATIVE 10/25/2015 2050   HGBUR trace-intact 11/25/2008 1530   BILIRUBINUR NEGATIVE 10/25/2015 2050   KETONESUR NEGATIVE 10/25/2015 2050   PROTEINUR NEGATIVE 10/25/2015 2050   UROBILINOGEN 0.2 05/12/2014 2315   NITRITE NEGATIVE 10/25/2015 2050   LEUKOCYTESUR SMALL (A) 10/25/2015 2050   Recent Results (from the past 240 hour(s))  Wound or Superficial Culture     Status: None   Collection Time: 06/07/17  4:31 PM  Result Value Ref Range Status   Specimen Description ABSCESS  Final   Special Requests NONE  Final   Gram Stain   Final    ABUNDANT WBC PRESENT, PREDOMINANTLY PMN ABUNDANT GRAM POSITIVE COCCI Performed at [facility], [address] [place], [place] [address]    Culture ABUNDANT STAPHYLOCOCCUS AUREUS  Final   Report Status 06/09/2017 FINAL  Final   Organism ID, Bacteria STAPHYLOCOCCUS AUREUS  Final      Susceptibility   Staphylococcus aureus - MIC*    CIPROFLOXACIN <=0.5 SENSITIVE Sensitive     ERYTHROMYCIN >=8 RESISTANT Resistant     GENTAMICIN <=0.5 SENSITIVE Sensitive     OXACILLIN <=0.25 SENSITIVE Sensitive     TETRACYCLINE <=1 SENSITIVE Sensitive     VANCOMYCIN 1 SENSITIVE Sensitive     TRIMETH/SULFA <=10 SENSITIVE Sensitive     CLINDAMYCIN <=0.25 SENSITIVE Sensitive     RIFAMPIN <=0.5 SENSITIVE Sensitive     Inducible Clindamycin NEGATIVE Sensitive     * ABUNDANT STAPHYLOCOCCUS AUREUS      Scheduled Meds: . enoxaparin (LOVENOX) injection  40 mg  Subcutaneous Q24H  . escitalopram  5 mg Oral Daily  . fluconazole  150 mg Oral Once  . insulin aspart  0-15 Units Subcutaneous TID WC  . insulin aspart  0-5 Units Subcutaneous QHS  . insulin aspart  3 Units Subcutaneous TID WC  . insulin glargine  15 Units Subcutaneous QHS  . living well with diabetes book   Does not apply Once   Continuous Infusions: . sodium chloride 125 mL/hr at 06/07/17 1525  . sodium chloride 75 mL/hr at 06/09/17 1057  . clindamycin (CLEOCIN) IV Stopped (06/09/17 0940)     LOS: 2 days   Time spent: 25 minutes.  Hazeline Junkeryan Topanga Alvelo, MD [facility] Pager [phone]  If 7PM-7AM, please contact night-coverage [email or web address] Password [email or web address] 06/09/2017, 3:03 PM

## 2017-06-09 NOTE — Progress Notes (Signed)
Inpatient Diabetes Program Recommendations  AACE/ADA: New Consensus Statement on Inpatient Glycemic Control (2015)  Target Ranges:  Prepandial:   less than 140 mg/dL      Peak postprandial:   less than 180 mg/dL (1-2 hours)      Critically ill patients:  140 - 180 mg/dL   Lab Results  Component Value Date   GLUCAP 205 (H) 06/09/2017   HGBA1C 12.9 (H) 06/07/2017    Review of Glycemic Control  Spoke with pt at length regarding her diabetes. Pt states she drinks 2-3 liters of coke or gingerale/day. Drinks sweet tea. States she is very depressed. Has no friends and stays home watching tv every day. Explained how poor diabetes control affects her body - especially eyes, heart, kidneys and nerves. Encouraged her to quit smoking and start exercising each day. Discussed HgbA1C of 12.9%.  RN has worked with pt on administering insulin and checking blood sugars. Pt is requesting insulin pen for home. Should be covered by Medicaid.   Pt admits to being very depressed. States she "could care less whether she's here or not." Said she has been on depression meds in the past, but did not follow up and stopped taking.  Goals:  Obtain PCP to manage DM Monitor blood sugars 3x/day Exercise (walk) daily Quit smoking Eat 3 meals/day instead of 1 Eliminate regular sodas and sweet tea  Inpatient Diabetes Program Recommendations:     Agree with orders. Titrate meal coverage insulin if post-prandials > 180 mg/dL.  Will f/u in am.   Thank you. Ailene Ards, RD, LDN, CDE Inpatient Diabetes Coordinator [phone]

## 2017-06-09 NOTE — Progress Notes (Signed)
   Subjective/Chief Complaint: Complains of pain at the site   Objective: Vital signs in last 24 hours: Temp:  [97.8 F (36.6 C)-99 F (37.2 C)] 97.8 F (36.6 C) (12/31 0653) Pulse Rate:  [88-102] 88 (12/31 0653) Resp:  [18-19] 18 (12/31 0653) BP: (135-158)/(91-94) 147/91 (12/31 0653) SpO2:  [93 %-97 %] 93 % (12/31 0653) Weight:  [92.3 kg (203 lb 7.8 oz)] 92.3 kg (203 lb 7.8 oz) (12/30 1600)    Intake/Output from previous day: 12/30 0701 - 12/31 0700 In: 3720 [P.O.:1320; I.V.:2250; IV Piggyback:150] Out: 8 [Urine:8] Intake/Output this shift: No intake/output data recorded.  General appearance: alert and cooperative Resp: clear to auscultation bilaterally Cardio: regular rate and rhythm GI: soft, non-tender; bowel sounds normal; no masses,  no organomegaly Pelvic: small open wound at right mons. no obvious undrained fluid. iodoform wick placed  Lab Results:  Recent Labs    06/08/17 0522 06/09/17 0433  WBC 11.6* 7.9  HGB 11.4* 12.4  HCT 35.0* 37.6  PLT 222 251   BMET Recent Labs    06/08/17 0522 06/09/17 0433  NA 135 135  K 3.6 4.1  CL 104 105  CO2 23 23  GLUCOSE 151* 278*  BUN 14 9  CREATININE 0.80 0.68  CALCIUM 7.7* 7.9*   PT/INR No results for input(s): LABPROT, INR in the last 72 hours. ABG No results for input(s): PHART, HCO3 in the last 72 hours.  Invalid input(s): PCO2, PO2  Studies/Results: No results found.  Anti-infectives: Anti-infectives (From admission, onward)   Start     Dose/Rate Route Frequency Ordered Stop   06/08/17 0100  clindamycin (CLEOCIN) IVPB 600 mg     600 mg 100 mL/hr over 30 Minutes Intravenous Every 8 hours 06/07/17 1718     06/07/17 1615  clindamycin (CLEOCIN) IVPB 600 mg     600 mg 100 mL/hr over 30 Minutes Intravenous  Once 06/07/17 1607 06/07/17 1809      Assessment/Plan: s/p * No surgery found * Advance diet  Continue abx and dressing changes. Will follow  LOS: 2 days    [name],Jeovani Weisenburger S 06/09/2017

## 2017-06-09 NOTE — Progress Notes (Signed)
Spoke with patient at bedside. She has had difficulty with transferring her Medicaid to another doctor. Contacted [facility] Case Manager to assist, he will contact the patient to f/u. Patient is also still trying to contact DSS. Patient requested a physician close to her home, found [facility] and they are accepting new patients. Provided them with the contact information and they will call patient for appt, they will need the Medicaid card updated prior to seeing patient. Discussed with patient importance of diabetes control and proper f/u, she does not seem to comprehend severity of illness, says her father died young and she may too. She has a son who assists as needed, she doesn't like to "bother" him though. He works at [employer] and she says he can assist with getting her to the doctor. Discussed above with diabetes coordinator. [phone]

## 2017-06-10 DIAGNOSIS — E6609 Other obesity due to excess calories: Secondary | ICD-10-CM

## 2017-06-10 LAB — COMPREHENSIVE METABOLIC PANEL
ALBUMIN: 2.9 g/dL — AB (ref 3.5–5.0)
ALK PHOS: 105 U/L (ref 38–126)
ALT: 17 U/L (ref 14–54)
AST: 17 U/L (ref 15–41)
Anion gap: 7 (ref 5–15)
BILIRUBIN TOTAL: 0.6 mg/dL (ref 0.3–1.2)
BUN: 10 mg/dL (ref 6–20)
CALCIUM: 8.3 mg/dL — AB (ref 8.9–10.3)
CO2: 21 mmol/L — ABNORMAL LOW (ref 22–32)
CREATININE: 0.7 mg/dL (ref 0.44–1.00)
Chloride: 104 mmol/L (ref 101–111)
GFR calc Af Amer: >60 mL/min (ref >60)
GLUCOSE: 236 mg/dL — AB (ref 65–99)
POTASSIUM: 4 mmol/L (ref 3.5–5.1)
Sodium: 132 mmol/L — ABNORMAL LOW (ref 135–145)
Total Protein: 5.9 g/dL — ABNORMAL LOW (ref 6.5–8.1)

## 2017-06-10 LAB — GLUCOSE, CAPILLARY
GLUCOSE-CAPILLARY: 211 mg/dL — AB (ref 65–99)
Glucose-Capillary: 134 mg/dL — ABNORMAL HIGH (ref 65–99)

## 2017-06-10 MED ORDER — INSULIN ASPART PROT & ASPART (70-30 MIX) 100 UNIT/ML PEN: 15.0000 [IU] | PEN_INJECTOR | Freq: Two times a day (BID) | SUBCUTANEOUS | 0 refills | Status: DC

## 2017-06-10 MED ORDER — METRONIDAZOLE 0.75 % VA GEL
1.0000 | Freq: Every day | VAGINAL | Status: DC
  Filled 2017-06-10: qty 70

## 2017-06-10 MED ORDER — CLINDAMYCIN HCL 300 MG PO CAPS: 300.0000 mg | ORAL_CAPSULE | Freq: Three times a day (TID) | ORAL | 0 refills | Status: DC

## 2017-06-10 MED ORDER — BLOOD GLUCOSE METER KIT: PACK | 0 refills | Status: DC

## 2017-06-10 MED ORDER — ESCITALOPRAM OXALATE 5 MG PO TABS: 5.0000 mg | ORAL_TABLET | Freq: Every day | ORAL | 0 refills | Status: DC

## 2017-06-10 MED ORDER — ONDANSETRON HCL 4 MG PO TABS: 4.0000 mg | ORAL_TABLET | Freq: Three times a day (TID) | ORAL | 0 refills | Status: DC | PRN

## 2017-06-10 MED ORDER — LISINOPRIL 5 MG PO TABS: 5.0000 mg | ORAL_TABLET | Freq: Every day | ORAL | 0 refills | Status: DC

## 2017-06-10 MED ORDER — INSULIN PEN NEEDLE 31G X 5 MM MISC: 0 refills | Status: DC

## 2017-06-10 MED ORDER — METFORMIN HCL 850 MG PO TABS: 850.0000 mg | ORAL_TABLET | Freq: Two times a day (BID) | ORAL | 0 refills | Status: DC

## 2017-06-10 MED ORDER — HYDROCODONE-ACETAMINOPHEN 5-325 MG PO TABS: 1.0000 | ORAL_TABLET | Freq: Four times a day (QID) | ORAL | 0 refills | Status: DC | PRN

## 2017-06-10 MED ORDER — METRONIDAZOLE 0.75 % VA GEL: 1.0000 | Freq: Every day | VAGINAL | 0 refills | Status: AC

## 2017-06-10 MED ORDER — ALBUTEROL SULFATE HFA 108 (90 BASE) MCG/ACT IN AERS: 2.0000 | INHALATION_SPRAY | Freq: Four times a day (QID) | RESPIRATORY_TRACT | 0 refills | Status: DC | PRN

## 2017-06-10 NOTE — Discharge Summary (Signed)
Physician Discharge Summary  [name] [name] PIR:[id number] DOB: 17-Feb-1971 DOA: 06/07/2017  PCP: System, Pcp Not In  Admit date: 06/07/2017 Discharge date: 06/10/2017  Admitted From: Home Disposition: Home   Recommendations for Outpatient Follow-up:  1. Follow up with PCP/endo in next week 2. Will be contacted by [facility] to establish care 3. Follow up BMP and CBG's. Started lisinopril, metformin and 70/30 insulin as below.   Home Health: None Equipment/Devices: None Discharge Condition: Stable CODE STATUS: DNR Diet recommendation: Heart healthy, carb-modified  Brief/Interim Summary: [name] [name] is a 47 y.o. female with a history of untreated DM and no medical follow up who presented to the ED with 6 days of fevers and boil on the right groin/vulva that began draining serosanguinous discharge in the ED. Blood sugar was grossly elevated, WBC 17.5. Clindamycin and lantus were given. Leukocytosis and CBGs have improved and the abscess has continued to have discharge.  Discharge Diagnoses:  Principal Problem:   Abscess of groin, right Active Problems:   OBESITY   Tobacco use disorder   Bipolar disorder with moderate depression (HCC)   DM (diabetes mellitus), type 2, [name]   Abscess  Right vulvar abscess and cellulitis: Spontaneously draining. - Continue clindamycin by mouth.  - Continue iodoform wick thru tomorrow per general surgery recommendations.  - No blood cultures drawn. Superficial culture showing staph aureus sensitive to clindamycin.  - Pain control: norco as below. [place] searched, no controlled substances filled by pt for >1 year.   Uncontrolled T2DM: HbA1c 12.9%, indicating severe chronic hyperglycemia. Pt in state of denial regarding Dx diabetes. Improved CBGs since admission.  - Based on inpatient requirements of lantus and SSI, will convert to 70/30 insulin 15u BID. Will need more than this, but starting here.  - Check  and record CBGs qAM and AC. To provide records of this to provider at follow up. - Frequent counseling by MD and RN staff. Pt adept at self-administering insulin while inpatient.  - Diabetes coordinator and CM consulted, arranged outpatient follow up, though pt will need to arrange change of medicaid PCP.  - Started metformin. Added lisinopril as BP is modestly elevated.    Vaginal irritation and discharge: HIV NR.  - Self swab for wet prep, GC/Chl NAAT, pending.  - Treated empirically for candidiasis and BV.  Bipolar disorder: Not in acute mania and not taking any medications.  - SSRI was started due to depression affecting medical care.  - Recommend outpatient psychiatry follow up.   Alkaline phosphatase elevation: Very mild without other cholestatic pattern.  - Recheck at follow up  Discharge Instructions Discharge Instructions    Diet - low sodium heart healthy   Complete by:  As directed    Discharge instructions   Complete by:  As directed    - Take clindamycin three times daily for 10 more days to treat the infection - You may use intravaginal metronidazole for vaginal itching as directed. You will need to follow up with your PCP.  - Start taking lexapro daily to help with depression - Start taking metformin twice daily for diabetes - Start taking lisinopril once daily for kidney protection from diabetes and blood pressure - Start taking novolog 70/30 mix 15 units twice daily before meals.  - Check your blood sugars at least three times daily before meals and record this value. Take this record to your first appointment.  - It is very important that you follow up for diabetes.  - Seek medical  attention sooner if you experience fever or new infection.   Increase activity slowly   Complete by:  As directed      Allergies as of 06/10/2017      Reactions   Tramadol Nausea Only   "feeling like I'm on fire"   Nicotine    Patient states she is allergic to the Nicotine patch  and nicotine gum and they cause her to break out in a rash.  She states she can smoke cigarettes.      Medication List    STOP taking these medications   atenolol 25 MG tablet Commonly known as:  TENORMIN   clonazePAM 0.5 MG tablet Commonly known as:  KLONOPIN   doxepin 10 MG capsule Commonly known as:  SINEQUAN   gabapentin 400 MG capsule Commonly known as:  NEURONTIN   hydrOXYzine 25 MG tablet Commonly known as:  ATARAX/VISTARIL   lamoTRIgine 25 MG tablet Commonly known as:  LAMICTAL   OLANZapine zydis 5 MG disintegrating tablet Commonly known as:  ZYPREXA   pantoprazole 40 MG tablet Commonly known as:  PROTONIX   traZODone 50 MG tablet Commonly known as:  DESYREL     TAKE these medications   acetaminophen 500 MG tablet Commonly known as:  TYLENOL Take 1,000 mg by mouth every 6 (six) hours as needed for mild pain or headache.   albuterol 108 (90 Base) MCG/ACT inhaler Commonly known as:  PROVENTIL HFA;VENTOLIN HFA Inhale 2 puffs into the lungs every 6 (six) hours as needed for wheezing or shortness of breath.   blood glucose meter kit and supplies Dispense based on patient and insurance preference. Use up to four times daily as directed. (FOR ICD-10 E10.9, E11.9).   clindamycin 300 MG capsule Commonly known as:  CLEOCIN Take 1 capsule (300 mg total) by mouth every 8 (eight) hours.   diphenhydrAMINE 25 MG tablet Commonly known as:  BENADRYL Take 25 mg by mouth every 6 (six) hours as needed for sleep.   escitalopram 5 MG tablet Commonly known as:  LEXAPRO Take 1 tablet (5 mg total) by mouth daily. Start taking on:  06/11/2017   HYDROcodone-acetaminophen 5-325 MG tablet Commonly known as:  NORCO/VICODIN Take 1 tablet by mouth every 6 (six) hours as needed for severe pain.   ibuprofen 200 MG tablet Commonly known as:  ADVIL Take 1 tablet (200 mg total) by mouth every 6 (six) hours as needed. What changed:    how much to take  reasons to take this    insulin aspart protamine - aspart (70-30) 100 UNIT/ML FlexPen Commonly known as:  NOVOLOG MIX 70/30 FLEXPEN Inject 0.15 mLs (15 Units total) into the skin 2 (two) times daily with a meal.   Insulin Pen Needle 31G X 5 MM Misc Use BID AC   lisinopril 5 MG tablet Commonly known as:  PRINIVIL,ZESTRIL Take 1 tablet (5 mg total) by mouth daily. Start taking on:  06/11/2017   metFORMIN 850 MG tablet Commonly known as:  GLUCOPHAGE Take 1 tablet (850 mg total) by mouth 2 (two) times daily.   metroNIDAZOLE 0.75 % vaginal gel Commonly known as:  METROGEL Place 1 Applicatorful vaginally at bedtime for 4 days. Start taking on:  06/11/2017   multivitamin with minerals Tabs tablet Take 1 tablet by mouth daily.   ondansetron 4 MG tablet Commonly known as:  ZOFRAN Take 1 tablet (4 mg total) by mouth every 8 (eight) hours as needed for nausea or vomiting.   VITAMIN D PO Take  1 tablet by mouth daily.      Follow-up Information    Endocrinology, [facility].   Specialty:  Endocrinology Why:  they will call you with f/u appt Contact information: [address] [place] [address] [phone]          Allergies  Allergen Reactions  . Tramadol Nausea Only    "feeling like I'm on fire"  . Nicotine     Patient states she is allergic to the Nicotine patch and nicotine gum and they cause her to break out in a rash.  She states she can smoke cigarettes.    Consultations: General surgery  Procedures/Studies: None  Subjective: No fevers, pain improving. Has been giving herself insulin while here, BPs better on lisinopril. Tolerating metformin.   Discharge Exam: Vitals:   06/10/17 0434 06/10/17 1400  BP: 120/65 (!) 156/100  Pulse: 89 94  Resp: 15 16  Temp: 98.1 F (36.7 C) 97.9 F (36.6 C)  SpO2: 100% 94%   General: Pt is alert, awake, not in acute distress Cardiovascular: RRR, S1/S2 +, no rubs, no gallops Respiratory: CTA bilaterally, no wheezing, no  rhonchi Skin: Induration smaller on right mons/vulva with drainage from iodoform gauze.   Labs: Basic Metabolic Panel: Recent Labs  Lab 06/07/17 1500 06/07/17 2022 06/08/17 0522 06/09/17 0433 06/10/17 0423  NA 131*  --  135 135 132*  K 4.6  --  3.6 4.1 4.0  CL 99*  --  104 105 104  CO2 21*  --  23 23 21*  GLUCOSE 478* 466* 151* 278* 236*  BUN 15  --  _0 CREATININE 1.00  --  0.80 0.68 0.70  CALCIUM 8.5*  --  7.7* 7.9* 8.3*   Liver Function Tests: Recent Labs  Lab 06/07/17 1500 06/10/17 0423  AST 13* 17  ALT 16 17  ALKPHOS 142* 105  BILITOT 0.9 0.6  PROT 6.5 5.9*  ALBUMIN 3.3* 2.9*   CBC: Recent Labs  Lab 06/07/17 1500 06/08/17 0522 06/09/17 0433  WBC 17.5* 11.6* 7.9  NEUTROABS 14.8*  --   --   HGB 13.2 11.4* 12.4  HCT 39.3 35.0* 37.6  MCV 87.3 88.8 88.9  PLT 228 222 251   CBG: Recent Labs  Lab 06/09/17 1148 06/09/17 1745 06/09/17 2106 06/10/17 0749 06/10/17 1136  GLUCAP 205* 249* 317* 211* 134*   Hgb A1c Recent Labs    06/07/17 1716  HGBA1C 12.9*   Urinalysis    Component Value Date/Time   COLORURINE YELLOW 10/25/2015 2050   APPEARANCEUR CLOUDY (A) 10/25/2015 2050   LABSPEC 1.006 10/25/2015 2050   PHURINE 6.0 10/25/2015 2050   GLUCOSEU NEGATIVE 10/25/2015 2050   [place] NEGATIVE 10/25/2015 2050   HGBUR trace-intact 11/25/2008 [place] 10/25/2015 2050   [place] 10/25/2015 2050   PROTEINUR NEGATIVE 10/25/2015 2050   UROBILINOGEN 0.2 05/12/2014 2315   NITRITE NEGATIVE 10/25/2015 2050   LEUKOCYTESUR SMALL (A) 10/25/2015 2050    Microbiology Recent Results (from the past 240 hour(s))  Wound or Superficial Culture     Status: None   Collection Time: 06/07/17  4:31 PM  Result Value Ref Range Status   Specimen Description ABSCESS  Final   Special Requests NONE  Final   Gram Stain   Final    ABUNDANT WBC PRESENT, PREDOMINANTLY PMN ABUNDANT GRAM POSITIVE COCCI Performed at [facility], [place]  [address] [place], [place] [address]    Culture ABUNDANT  STAPHYLOCOCCUS AUREUS  Final   Report Status 06/09/2017 FINAL  Final   Organism ID, Bacteria STAPHYLOCOCCUS AUREUS  Final      Susceptibility   Staphylococcus aureus - MIC*    CIPROFLOXACIN <=0.5 SENSITIVE Sensitive     ERYTHROMYCIN >=8 RESISTANT Resistant     GENTAMICIN <=0.5 SENSITIVE Sensitive     OXACILLIN <=0.25 SENSITIVE Sensitive     TETRACYCLINE <=1 SENSITIVE Sensitive     VANCOMYCIN 1 SENSITIVE Sensitive     TRIMETH/SULFA <=10 SENSITIVE Sensitive     CLINDAMYCIN <=0.25 SENSITIVE Sensitive     RIFAMPIN <=0.5 SENSITIVE Sensitive     Inducible Clindamycin NEGATIVE Sensitive     * ABUNDANT STAPHYLOCOCCUS AUREUS    Time coordinating discharge: Approximately 40 minutes  Vance Gather, MD  [facility] 06/10/2017, 3:24 PM Pager [phone]

## 2017-06-10 NOTE — Progress Notes (Signed)
Went over discharge instructions and over each medication in detail with patient and answered any questions she had. Patient expressed understanding on each medication. Reinforced to patient how important it was to follow up with her PCP and to make sure she got her insulin this evening. Removed IV from patient's left forearm- IV catheter was clean, dry, and intact. Patient had no further questions and stated she understood wound care as well. Patient left unit in wheelchair- son picking up at main entrance.

## 2017-06-10 NOTE — Progress Notes (Signed)
   Subjective/Chief Complaint: Feels a little better   Objective: Vital signs in last 24 hours: Temp:  [98.1 F (36.7 C)-98.6 F (37 C)] 98.1 F (36.7 C) (01/01 0434) Pulse Rate:  [86-94] 89 (01/01 0434) Resp:  [15-19] 15 (01/01 0434) BP: (120-158)/(65-90) 120/65 (01/01 0434) SpO2:  [98 %-100 %] 100 % (01/01 0434)    Intake/Output from previous day: 12/31 0701 - 01/01 0700 In: 2240 [P.O.:1440; I.V.:700; IV Piggyback:100] Out: 600 [Urine:600] Intake/Output this shift: No intake/output data recorded.  General appearance: alert and cooperative Resp: clear to auscultation bilaterally Cardio: regular rate and rhythm GI: soft, minimal tenderness at mons  Lab Results:  Recent Labs    06/08/17 0522 06/09/17 0433  WBC 11.6* 7.9  HGB 11.4* 12.4  HCT 35.0* 37.6  PLT 222 251   BMET Recent Labs    06/09/17 0433 06/10/17 0423  NA 135 132*  K 4.1 4.0  CL 105 104  CO2 23 21*  GLUCOSE 278* 236*  BUN 9 10  CREATININE 0.68 0.70  CALCIUM 7.9* 8.3*   PT/INR No results for input([name]): LABPROT, INR in the last 72 hours. ABG No results for input([name]): PHART, HCO3 in the last 72 hours.  Invalid input([name]): PCO2, PO2  Studies/Results: No results found.  Anti-infectives: Anti-infectives (From admission, onward)   Start     Dose/Rate Route Frequency Ordered Stop   06/09/17 1600  fluconazole (DIFLUCAN) tablet 150 mg     150 mg Oral  Once 06/09/17 1503 06/09/17 1600   06/09/17 1600  clindamycin (CLEOCIN) capsule 600 mg     600 mg Oral Every 8 hours 06/09/17 1511     06/08/17 0100  clindamycin (CLEOCIN) IVPB 600 mg  Status:  Discontinued     600 mg 100 mL/hr over 30 Minutes Intravenous Every 8 hours 06/07/17 1718 06/09/17 1511   06/07/17 1615  clindamycin (CLEOCIN) IVPB 600 mg     600 mg 100 mL/hr over 30 Minutes Intravenous  Once 06/07/17 1607 06/07/17 1809      Assessment/Plan: [name]/p * No surgery found * Advance diet  Change dressing today Ok for d/c from surgical  standpoint. Would d/c wick tomorrow and shower. Keep area covered with clean dry gauze. Follow up in our clinic in 1-2 weeks  LOS: 3 days    [name],[name] [name] 06/10/2017

## 2017-06-10 NOTE — Progress Notes (Signed)
   Pt states she does not have her Medicaid card and it's been awhile since she had it in her possession. Will give her the Medicaid number on our records here at [facility].  Thank you. Ailene Ards, RD, LDN, CDE Inpatient Diabetes Coordinator [phone]

## 2017-06-10 NOTE — Progress Notes (Signed)
Inpatient Diabetes Program Recommendations  AACE/ADA: New Consensus Statement on Inpatient Glycemic Control (2015)  Target Ranges:  Prepandial:   less than 140 mg/dL      Peak postprandial:   less than 180 mg/dL (1-2 hours)      Critically ill patients:  140 - 180 mg/dL   Lab Results  Component Value Date   GLUCAP 134 (H) 06/10/2017   HGBA1C 12.9 (H) 06/07/2017    Review of Glycemic Control  Taught insulin pen administration. Pt was able to return demonstration. To get insulin, supplies and glucose meter at [facility] on the way home.  Inpatient Diabetes Program Recommendations:     70/30 15 units bid (Novolog pen or Novolin vial) F/U with PCP within a week. Monitor blood sugars 3-4x/day Hypoglycemia guidelines  Note: would not add correction insulin at this point since pt seems overwhelmed with diabetes instructions. No family present.  Thank you. Ailene Ards, RD, LDN, CDE Inpatient Diabetes Coordinator [phone]

## 2017-06-11 LAB — CERVICOVAGINAL ANCILLARY ONLY
BACTERIAL VAGINITIS: NEGATIVE
CANDIDA VAGINITIS: POSITIVE — AB
CHLAMYDIA, DNA PROBE: NEGATIVE
Neisseria Gonorrhea: NEGATIVE
Trichomonas: NEGATIVE

## 2018-01-03 ENCOUNTER — Inpatient Hospital Stay (HOSPITAL_COMMUNITY)
Admission: EM | Admit: 2018-01-03 | Discharge: 2018-01-06 | DRG: 638 | Disposition: A | Discharge status: Home / Self Care | Payer: Medicaid Other | Attending: Internal Medicine | Admitting: Internal Medicine

## 2018-01-03 ENCOUNTER — Encounter (HOSPITAL_COMMUNITY): Payer: Self-pay

## 2018-01-03 ENCOUNTER — Emergency Department (HOSPITAL_COMMUNITY): Payer: Medicaid Other

## 2018-01-03 ENCOUNTER — Other Ambulatory Visit: Payer: Self-pay

## 2018-01-03 DIAGNOSIS — Z885 Allergy status to narcotic agent status: Secondary | ICD-10-CM

## 2018-01-03 DIAGNOSIS — I10 Essential (primary) hypertension: Secondary | ICD-10-CM | POA: Diagnosis present

## 2018-01-03 DIAGNOSIS — E669 Obesity, unspecified: Secondary | ICD-10-CM | POA: Diagnosis present

## 2018-01-03 DIAGNOSIS — F4321 Adjustment disorder with depressed mood: Secondary | ICD-10-CM

## 2018-01-03 DIAGNOSIS — F314 Bipolar disorder, current episode depressed, severe, without psychotic features: Secondary | ICD-10-CM | POA: Diagnosis present

## 2018-01-03 DIAGNOSIS — Z888 Allergy status to other drugs, medicaments and biological substances status: Secondary | ICD-10-CM

## 2018-01-03 DIAGNOSIS — B370 Candidal stomatitis: Secondary | ICD-10-CM | POA: Diagnosis not present

## 2018-01-03 DIAGNOSIS — R0902 Hypoxemia: Secondary | ICD-10-CM | POA: Diagnosis present

## 2018-01-03 DIAGNOSIS — E870 Hyperosmolality and hypernatremia: Secondary | ICD-10-CM | POA: Diagnosis not present

## 2018-01-03 DIAGNOSIS — Z79899 Other long term (current) drug therapy: Secondary | ICD-10-CM

## 2018-01-03 DIAGNOSIS — B373 Candidiasis of vulva and vagina: Secondary | ICD-10-CM | POA: Diagnosis present

## 2018-01-03 DIAGNOSIS — E1110 Type 2 diabetes mellitus with ketoacidosis without coma: Principal | ICD-10-CM | POA: Diagnosis present

## 2018-01-03 DIAGNOSIS — F3132 Bipolar disorder, current episode depressed, moderate: Secondary | ICD-10-CM | POA: Diagnosis not present

## 2018-01-03 DIAGNOSIS — E1165 Type 2 diabetes mellitus with hyperglycemia: Secondary | ICD-10-CM | POA: Diagnosis present

## 2018-01-03 DIAGNOSIS — F17200 Nicotine dependence, unspecified, uncomplicated: Secondary | ICD-10-CM | POA: Diagnosis present

## 2018-01-03 DIAGNOSIS — Z9119 Patient's noncompliance with other medical treatment and regimen: Secondary | ICD-10-CM

## 2018-01-03 DIAGNOSIS — Z833 Family history of diabetes mellitus: Secondary | ICD-10-CM

## 2018-01-03 DIAGNOSIS — B3731 Acute candidiasis of vulva and vagina: Secondary | ICD-10-CM | POA: Diagnosis present

## 2018-01-03 DIAGNOSIS — N390 Urinary tract infection, site not specified: Secondary | ICD-10-CM

## 2018-01-03 DIAGNOSIS — E876 Hypokalemia: Secondary | ICD-10-CM | POA: Diagnosis present

## 2018-01-03 DIAGNOSIS — J4522 Mild intermittent asthma with status asthmaticus: Secondary | ICD-10-CM

## 2018-01-03 DIAGNOSIS — E119 Type 2 diabetes mellitus without complications: Secondary | ICD-10-CM | POA: Diagnosis present

## 2018-01-03 DIAGNOSIS — G43909 Migraine, unspecified, not intractable, without status migrainosus: Secondary | ICD-10-CM | POA: Diagnosis present

## 2018-01-03 DIAGNOSIS — F17210 Nicotine dependence, cigarettes, uncomplicated: Secondary | ICD-10-CM | POA: Diagnosis present

## 2018-01-03 DIAGNOSIS — Z66 Do not resuscitate: Secondary | ICD-10-CM | POA: Diagnosis present

## 2018-01-03 DIAGNOSIS — Z9114 Patient's other noncompliance with medication regimen: Secondary | ICD-10-CM

## 2018-01-03 DIAGNOSIS — Z794 Long term (current) use of insulin: Secondary | ICD-10-CM

## 2018-01-03 DIAGNOSIS — J45909 Unspecified asthma, uncomplicated: Secondary | ICD-10-CM | POA: Diagnosis present

## 2018-01-03 DIAGNOSIS — F319 Bipolar disorder, unspecified: Secondary | ICD-10-CM | POA: Diagnosis present

## 2018-01-03 DIAGNOSIS — E1310 Other specified diabetes mellitus with ketoacidosis without coma: Secondary | ICD-10-CM

## 2018-01-03 DIAGNOSIS — IMO0002 Reserved for concepts with insufficient information to code with codable children: Secondary | ICD-10-CM | POA: Diagnosis present

## 2018-01-03 LAB — GLUCOSE, CAPILLARY
GLUCOSE-CAPILLARY: 261 mg/dL — AB (ref 70–99)
Glucose-Capillary: 247 mg/dL — ABNORMAL HIGH (ref 70–99)

## 2018-01-03 LAB — BASIC METABOLIC PANEL
ANION GAP: 10 (ref 5–15)
BUN: 32 mg/dL — ABNORMAL HIGH (ref 6–20)
CO2: 12 mmol/L — AB (ref 22–32)
Calcium: 8.4 mg/dL — ABNORMAL LOW (ref 8.9–10.3)
Chloride: 116 mmol/L — ABNORMAL HIGH (ref 98–111)
Creatinine, Ser: 1.13 mg/dL — ABNORMAL HIGH (ref 0.44–1.00)
GFR calc Af Amer: >60 mL/min (ref >60)
GFR calc non Af Amer: 57 mL/min — ABNORMAL LOW (ref >60)
GLUCOSE: 299 mg/dL — AB (ref 70–99)
POTASSIUM: 4.3 mmol/L (ref 3.5–5.1)
Sodium: 138 mmol/L (ref 135–145)

## 2018-01-03 LAB — CBC WITH DIFFERENTIAL/PLATELET
Basophils Absolute: 0 10*3/uL (ref 0.0–0.1)
Basophils Relative: 0 %
EOS PCT: 0 %
Eosinophils Absolute: 0 10*3/uL (ref 0.0–0.7)
HEMATOCRIT: 45.2 % (ref 36.0–46.0)
HEMOGLOBIN: 15.3 g/dL — AB (ref 12.0–15.0)
LYMPHS ABS: 1.8 10*3/uL (ref 0.7–4.0)
LYMPHS PCT: 9 %
MCH: 29.5 pg (ref 26.0–34.0)
MCHC: 33.8 g/dL (ref 30.0–36.0)
MCV: 87.3 fL (ref 78.0–100.0)
Monocytes Absolute: 1.3 10*3/uL — ABNORMAL HIGH (ref 0.1–1.0)
Monocytes Relative: 7 %
NEUTROS ABS: 15.8 10*3/uL — AB (ref 1.7–7.7)
NEUTROS PCT: 84 %
Platelets: 281 10*3/uL (ref 150–400)
RBC: 5.18 MIL/uL — AB (ref 3.87–5.11)
RDW: 14.5 % (ref 11.5–15.5)
WBC: 18.9 10*3/uL — ABNORMAL HIGH (ref 4.0–10.5)

## 2018-01-03 LAB — I-STAT CHEM 8, ED
BUN: 42 mg/dL — ABNORMAL HIGH (ref 6–20)
CALCIUM ION: 1.2 mmol/L (ref 1.15–1.40)
Chloride: 113 mmol/L — ABNORMAL HIGH (ref 98–111)
Creatinine, Ser: 1 mg/dL (ref 0.44–1.00)
Glucose, Bld: 489 mg/dL — ABNORMAL HIGH (ref 70–99)
HEMATOCRIT: 48 % — AB (ref 36.0–46.0)
Hemoglobin: 16.3 g/dL — ABNORMAL HIGH (ref 12.0–15.0)
POTASSIUM: 4.7 mmol/L (ref 3.5–5.1)
SODIUM: 137 mmol/L (ref 135–145)
TCO2: 9 mmol/L — ABNORMAL LOW (ref 22–32)

## 2018-01-03 LAB — URINALYSIS, ROUTINE W REFLEX MICROSCOPIC
Bilirubin Urine: NEGATIVE
Glucose, UA: >=500 mg/dL — AB
KETONES UR: 20 mg/dL — AB
Nitrite: NEGATIVE
PROTEIN: 30 mg/dL — AB
Specific Gravity, Urine: 1.028 (ref 1.005–1.030)
pH: 6 (ref 5.0–8.0)

## 2018-01-03 LAB — LACTIC ACID, PLASMA: LACTIC ACID, VENOUS: 1 mmol/L (ref 0.5–1.9)

## 2018-01-03 LAB — I-STAT BETA HCG BLOOD, ED (MC, WL, AP ONLY)

## 2018-01-03 LAB — CBG MONITORING, ED
GLUCOSE-CAPILLARY: 401 mg/dL — AB (ref 70–99)
Glucose-Capillary: 487 mg/dL — ABNORMAL HIGH (ref 70–99)
Glucose-Capillary: 364 mg/dL — ABNORMAL HIGH (ref 70–99)

## 2018-01-03 LAB — BETA-HYDROXYBUTYRIC ACID: Beta-Hydroxybutyric Acid: 3.2 mmol/L — ABNORMAL HIGH (ref 0.05–0.27)

## 2018-01-03 LAB — PHOSPHORUS: Phosphorus: 1.8 mg/dL — ABNORMAL LOW (ref 2.5–4.6)

## 2018-01-03 LAB — MAGNESIUM: Magnesium: 2.1 mg/dL (ref 1.7–2.4)

## 2018-01-03 LAB — TROPONIN I: Troponin I: 0.05 ng/mL (ref <0.03)

## 2018-01-03 LAB — MRSA PCR SCREENING: MRSA by PCR: NEGATIVE

## 2018-01-03 MED ORDER — CHLORHEXIDINE GLUCONATE 0.12 % MT SOLN
15.0000 mL | Freq: Two times a day (BID) | OROMUCOSAL | Status: DC
  Administered 2018-01-04 – 2018-01-06 (×4): 15 mL via OROMUCOSAL
  Filled 2018-01-03 (×4): qty 15

## 2018-01-03 MED ORDER — SODIUM CHLORIDE 0.9 % IV SOLN
INTRAVENOUS | Status: DC
  Administered 2018-01-03: 22:00:00 via INTRAVENOUS

## 2018-01-03 MED ORDER — ENOXAPARIN SODIUM 40 MG/0.4ML ~~LOC~~ SOLN
40.0000 mg | Freq: Every day | SUBCUTANEOUS | Status: DC
  Administered 2018-01-04 – 2018-01-05 (×2): 40 mg via SUBCUTANEOUS
  Filled 2018-01-03 (×3): qty 0.4

## 2018-01-03 MED ORDER — MAGIC MOUTHWASH
15.0000 mL | Freq: Once | ORAL | Status: AC
  Administered 2018-01-03: 15 mL via ORAL
  Filled 2018-01-03: qty 15

## 2018-01-03 MED ORDER — LORAZEPAM 2 MG/ML IJ SOLN
1.0000 mg | Freq: Once | INTRAMUSCULAR | Status: AC
  Administered 2018-01-03: 1 mg via INTRAVENOUS
  Filled 2018-01-03: qty 1

## 2018-01-03 MED ORDER — ONDANSETRON HCL 4 MG/2ML IJ SOLN
4.0000 mg | Freq: Four times a day (QID) | INTRAMUSCULAR | Status: DC | PRN
Start: 2018-01-03 — End: 2018-01-06
  Administered 2018-01-03 – 2018-01-06 (×7): 4 mg via INTRAVENOUS
  Filled 2018-01-03 (×8): qty 2

## 2018-01-03 MED ORDER — NYSTATIN 100000 UNIT/ML MT SUSP
5.0000 mL | Freq: Four times a day (QID) | OROMUCOSAL | Status: DC
  Administered 2018-01-03 – 2018-01-06 (×11): 500000 [IU] via OROMUCOSAL
  Filled 2018-01-03 (×11): qty 5

## 2018-01-03 MED ORDER — LACTATED RINGERS IV BOLUS
1000.0000 mL | Freq: Once | INTRAVENOUS | Status: AC
  Administered 2018-01-03: 1000 mL via INTRAVENOUS

## 2018-01-03 MED ORDER — METHYLPREDNISOLONE SODIUM SUCC 125 MG IJ SOLR: 125.0000 mg | Freq: Once | INTRAMUSCULAR | Status: DC

## 2018-01-03 MED ORDER — ONDANSETRON HCL 4 MG/2ML IJ SOLN
4.0000 mg | Freq: Once | INTRAMUSCULAR | Status: AC
  Administered 2018-01-03: 4 mg via INTRAVENOUS
  Filled 2018-01-03: qty 2

## 2018-01-03 MED ORDER — SODIUM CHLORIDE 0.9 % IV SOLN
INTRAVENOUS | Status: DC
  Administered 2018-01-03: 4.3 [IU]/h via INTRAVENOUS
  Filled 2018-01-03: qty 1

## 2018-01-03 MED ORDER — LEVOFLOXACIN IN D5W 750 MG/150ML IV SOLN
750.0000 mg | Freq: Once | INTRAVENOUS | Status: AC
  Administered 2018-01-03: 750 mg via INTRAVENOUS
  Filled 2018-01-03: qty 150

## 2018-01-03 MED ORDER — SODIUM CHLORIDE 0.9 % IV SOLN
INTRAVENOUS | Status: DC
  Administered 2018-01-03: 15:00:00 via INTRAVENOUS

## 2018-01-03 MED ORDER — IOPAMIDOL (ISOVUE-370) INJECTION 76%
100.0000 mL | Freq: Once | INTRAVENOUS | Status: AC | PRN
Start: 2018-01-03 — End: 2018-01-03
  Administered 2018-01-03: 100 mL via INTRAVENOUS

## 2018-01-03 MED ORDER — IOPAMIDOL (ISOVUE-370) INJECTION 76%
INTRAVENOUS | Status: AC
  Filled 2018-01-03: qty 100

## 2018-01-03 MED ORDER — ALBUTEROL SULFATE (2.5 MG/3ML) 0.083% IN NEBU: 2.5000 mg | INHALATION_SOLUTION | Freq: Once | RESPIRATORY_TRACT | Status: DC

## 2018-01-03 MED ORDER — DEXTROSE-NACL 5-0.45 % IV SOLN: INTRAVENOUS | Status: DC

## 2018-01-03 MED ORDER — POTASSIUM CHLORIDE 10 MEQ/100ML IV SOLN
10.0000 meq | INTRAVENOUS | Status: AC
  Administered 2018-01-03 (×2): 10 meq via INTRAVENOUS
  Filled 2018-01-03 (×2): qty 100

## 2018-01-03 MED ORDER — KETOROLAC TROMETHAMINE 30 MG/ML IJ SOLN
30.0000 mg | Freq: Once | INTRAMUSCULAR | Status: AC
  Administered 2018-01-03: 30 mg via INTRAVENOUS
  Filled 2018-01-03: qty 1

## 2018-01-03 MED ORDER — DEXTROSE-NACL 5-0.45 % IV SOLN
INTRAVENOUS | Status: DC
  Administered 2018-01-04: 02:00:00 via INTRAVENOUS

## 2018-01-03 MED ORDER — ALBUTEROL SULFATE (2.5 MG/3ML) 0.083% IN NEBU: 5.0000 mg | INHALATION_SOLUTION | Freq: Once | RESPIRATORY_TRACT | Status: DC

## 2018-01-03 MED ORDER — IPRATROPIUM-ALBUTEROL 0.5-2.5 (3) MG/3ML IN SOLN
3.0000 mL | Freq: Once | RESPIRATORY_TRACT | Status: AC
  Administered 2018-01-03: 3 mL via RESPIRATORY_TRACT
  Filled 2018-01-03: qty 3

## 2018-01-03 MED ORDER — ORAL CARE MOUTH RINSE: 15.0000 mL | Freq: Two times a day (BID) | OROMUCOSAL | Status: DC

## 2018-01-03 NOTE — ED Notes (Signed)
Bed: XY80 Expected date:  Expected time:  Means of arrival:  Comments: 47 yo SOB

## 2018-01-03 NOTE — ED Triage Notes (Signed)
She c/o shortness of breath since this Thursday. She cites deep-cleaning with bleach in an enclosed space this Wed. She rec'd. 125mg  Solu Medrol and  A Duoneb treatment en route to hospital. She arrives mildly short of breath and in no distress.

## 2018-01-03 NOTE — ED Notes (Signed)
Pt has removed self from monitor tied cords and IV fluids in knots, stating she has to go to bathroom. Pt tachypnea wanting something for her thrush. Pt assisted to bathroom then back and reconnected to monitor.

## 2018-01-03 NOTE — H&P (Addendum)
[name] [name]:[id number] DOB: 07-May-1971 DOA: 01/03/2018     PCP: System, Pcp Not In   Outpatient Specialists:  NONE  . Patient arrived to ER on 01/03/18 at 1219  Patient coming from: home Lives alone,      Chief Complaint:  Chief Complaint  Patient presents with  . Shortness of Breath    HPI: [name] [name] is a 47 y.o. female with medical history significant of DM 2 poorly controlled, obesity, tobacco abuse, bipolar disorder    Presented with shortness of breath and rapid breathing.  Initially patient states that she was deep cleaning with bleach 4 days ago she was evaluated by EMS she was given 125 of Solu-Medrol and DuoNeb in route to the hospital for presumed respiratory distress patient refused to come to the hospital at that time.  Have not had any cough fever no nausea no wheezing but continued to have rapid breathing She presented to to emergency department initially felt to have bronchitis.  Was given breathing treatments chest x-ray was clear proceeded to get a CTA to rule out PE.  Lab work was noted to have evidence of leukocytosis hemoconcentration metabolic acidosis but also respiratory alkalosis her blood sugar was noted to be 489 if bicarb down to 9.  VBG 7.225/2 21 With AG of 15  History of uncontrolled DM2 last Hg A1C 12.9 stated have not used insulin since January due to problems getting Medicaid. She had problem accessing any of her medications. In the past per records patient was in denial regarding her diagnosis of diabetes and were not following up with PCP or using insulin.      While in ER: Initially treated for bronchitis then was noted to likely to be in DKA started on IV fluids and glucose stabilizer with  improvement  Following Medications were ordered in ER: Medications  0.9 %  sodium chloride infusion ( Intravenous New Bag/Given 01/03/18 1448)  iopamidol (ISOVUE-370) 76 % injection (has no administration in time range)  insulin regular  (NOVOLIN R,HUMULIN R) 100 Units in sodium chloride 0.9 % 100 mL (1 Units/mL) infusion (4.3 Units/hr Intravenous New Bag/Given 01/03/18 1841)  lactated ringers bolus 1,000 mL (1,000 mLs Intravenous New Bag/Given 01/03/18 1857)  potassium chloride 10 mEq in 100 mL IVPB (10 mEq Intravenous New Bag/Given 01/03/18 1848)  dextrose 5 %-0.45 % sodium chloride infusion (has no administration in time range)  LORazepam (ATIVAN) injection 1 mg (1 mg Intravenous Given 01/03/18 1448)  iopamidol (ISOVUE-370) 76 % injection 100 mL (100 mLs Intravenous Contrast Given 01/03/18 1508)  levofloxacin (LEVAQUIN) IVPB 750 mg (0 mg Intravenous Stopped 01/03/18 1845)  ipratropium-albuterol (DUONEB) 0.5-2.5 (3) MG/3ML nebulizer solution 3 mL (3 mLs Nebulization Given 01/03/18 1712)  ondansetron (ZOFRAN) injection 4 mg (4 mg Intravenous Given 01/03/18 1850)  magic mouthwash (15 mLs Oral Given 01/03/18 1842)    Significant initial  Findings: Abnormal Labs Reviewed  CBC WITH DIFFERENTIAL/PLATELET - Abnormal; Notable for the following components:      Result Value   WBC 18.9 (*)    RBC 5.18 (*)    Hemoglobin 15.3 (*)    Neutro Abs 15.8 (*)    Monocytes Absolute 1.3 (*)    All other components within normal limits  BLOOD GAS, VENOUS - Abnormal; Notable for the following components:   pH, Ven 7.225 (*)    pCO2, Ven 21.0 (*)    pO2, Ven 50.4 (*)    Bicarbonate 8.4 (*)    All other  components within normal limits  I-STAT CHEM 8, ED - Abnormal; Notable for the following components:   Chloride 113 (*)    BUN 42 (*)    Glucose, Bld 489 (*)    TCO2 9 (*)    Hemoglobin 16.3 (*)    HCT 48.0 (*)    All other components within normal limits  CBG MONITORING, ED - Abnormal; Notable for the following components:   Glucose-Capillary 487 (*)    All other components within normal limits     Na 137 K 4.7  Cr   Up from baseline see below Lab Results  Component Value Date   CREATININE 1.00 01/03/2018   CREATININE 0.70  06/10/2017   CREATININE 0.68 06/09/2017      WBC  18  HG/HCT Up from baseline see below    Component Value Date/Time   HGB 16.3 (H) 01/03/2018 1449   HCT 48.0 (H) 01/03/2018 1449       Troponin (Point of Care Test) No results for input(s): TROPIPOC in the last 72 hours.    Lactic Acid, Venous    Component Value Date/Time   LATICACIDVEN 0.91 06/07/2017 1655      UA  ordered   CTA no PE  CXR -  NON acute    ECG:  Personally reviewed by me showing: HR : 127 Rhythm: sinus tachycardia   no evidence of ischemic changes QTC 488     ED Triage Vitals  Enc Vitals Group     BP 01/03/18 1700 (!) 158/97     Pulse Rate 01/03/18 1700 (!) 149     Resp 01/03/18 1700 (!) 34     Temp --      Temp src --      SpO2 01/03/18 1230 (!) 86 %     Weight 01/03/18 1838 203 lb 8 oz (92.3 kg)     Height 01/03/18 1837 5\' 5"  (1.651 m)     Head Circumference --      Peak Flow --      Pain Score 01/03/18 1235 0     Pain Loc --      Pain Edu? --      Excl. in GC? --   TMAX(24)@       Latest  Blood pressure (!) 147/90, pulse (!) 142, resp. rate (!) 30, height 5\' 5"  (1.651 m), weight 92.3 kg (203 lb 8 oz), SpO2 99 %.    Hospitalist was called for admission for DKA due to noncompliance   Review of Systems:    Pertinent positives include: shortness of breath at rest.dyspnea on exertion  Constitutional:  No weight loss, night sweats, Fevers, chills, fatigue, weight loss  HEENT:  No headaches, Difficulty swallowing,Tooth/dental problems,Sore throat,  No sneezing, itching, ear ache, nasal congestion, post nasal drip,  Cardio-vascular:  No chest pain, Orthopnea, PND, anasarca, dizziness, palpitations.no Bilateral lower extremity swelling  GI:  No heartburn, indigestion, abdominal pain, nausea, vomiting, diarrhea, change in bowel habits, loss of appetite, melena, blood in stool, hematemesis Resp:  no  No , No excess mucus, no productive cough, No non-productive cough, No  coughing up of blood.No change in color of mucus.No wheezing. Skin:  no rash or lesions. No jaundice GU:  no dysuria, change in color of urine, no urgency or frequency. No straining to urinate.  No flank pain.  Musculoskeletal:  No joint pain or no joint swelling. No decreased range of motion. No back pain.  Psych:  No change in mood  or affect. No depression or anxiety. No memory loss.  Neuro: no localizing neurological complaints, no tingling, no weakness, no double vision, no gait abnormality, no slurred speech, no confusion  All systems reviewed and apart from HOPI all are negative  Past Medical History:   Past Medical History:  Diagnosis Date  . Anxiety disorder   . Asthma   . Bipolar affective disorder (HCC)   . Depression   . Diabetes mellitus   . Migraine   . Schizophrenia [facility])       Past Surgical History:  Procedure Laterality Date  . KNEE ARTHROSCOPY     x 2  . KNEE SURGERY Left    Incision made and knee cleaned out  . TUBAL LIGATION      Social History:  Ambulatory   independently      reports that she has been smoking cigarettes.  She has a 29.00 pack-year smoking history. She has never used smokeless tobacco. She reports that she drinks alcohol. She reports that she does not use drugs.   Family History:   Family History  Problem Relation Age of Onset  . Bipolar disorder Mother   . Hypertension Father   . Diabetes Father     Allergies: Allergies  Allergen Reactions  . Tramadol Nausea Only    "feeling like I'm on fire"  . Nicotine     Patient states she is allergic to the Nicotine patch and nicotine gum and they cause her to break out in a rash.  She states she can smoke cigarettes.     Prior to Admission medications   Medication Sig Start Date End Date Taking? Authorizing Provider  acetaminophen (TYLENOL) 500 MG tablet Take 1,000 mg by mouth every 6 (six) hours as needed for mild pain or headache.    [provider]  albuterol  (PROVENTIL HFA;VENTOLIN HFA) 108 (90 Base) MCG/ACT inhaler Inhale 2 puffs into the lungs every 6 (six) hours as needed for wheezing or shortness of breath. 06/10/17   Tyrone Nine, MD  Cholecalciferol (VITAMIN D PO) Take 1 tablet by mouth daily.    [provider]  clindamycin (CLEOCIN) 300 MG capsule Take 1 capsule (300 mg total) by mouth every 8 (eight) hours. Patient not taking: Reported on 01/03/2018 06/10/17   Tyrone Nine, MD  diphenhydrAMINE (BENADRYL) 25 MG tablet Take 25 mg by mouth every 6 (six) hours as needed for sleep.    [provider]  escitalopram (LEXAPRO) 5 MG tablet Take 1 tablet (5 mg total) by mouth daily. 06/11/17   Tyrone Nine, MD  HYDROcodone-acetaminophen (NORCO/VICODIN) 5-325 MG tablet Take 1 tablet by mouth every 6 (six) hours as needed for severe pain. 06/10/17   Tyrone Nine, MD  ibuprofen (ADVIL) 200 MG tablet Take 1 tablet (200 mg total) by mouth every 6 (six) hours as needed. Patient taking differently: Take 400 mg by mouth every 6 (six) hours as needed for headache.  01/19/16   Reymundo Poll, MD  insulin aspart protamine - aspart (NOVOLOG MIX 70/30 FLEXPEN) (70-30) 100 UNIT/ML FlexPen Inject 0.15 mLs (15 Units total) into the skin 2 (two) times daily with a meal. 06/10/17   Tyrone Nine, MD  lisinopril (PRINIVIL,ZESTRIL) 5 MG tablet Take 1 tablet (5 mg total) by mouth daily. 06/11/17   Tyrone Nine, MD  metFORMIN (GLUCOPHAGE) 850 MG tablet Take 1 tablet (850 mg total) by mouth 2 (two) times daily. 06/10/17   Tyrone Nine, MD  Multiple  Vitamin (MULTIVITAMIN WITH MINERALS) TABS tablet Take 1 tablet by mouth daily.    [provider]  ondansetron (ZOFRAN) 4 MG tablet Take 1 tablet (4 mg total) by mouth every 8 (eight) hours as needed for nausea or vomiting. 06/10/17   Tyrone Nine, MD  divalproex (DEPAKOTE ER) 500 MG 24 hr tablet Take 2 tablets (1,000 mg total) by mouth at bedtime. Patient not taking: Reported on 08/24/2015 05/12/14 08/24/15  Ward,  Layla Maw, DO  DULoxetine (CYMBALTA) 60 MG capsule Take 60 mg by mouth daily.  05/21/12  [provider]  ergocalciferol (VITAMIN D2) 50000 UNIT capsule Take 50,000 Units by mouth once a week. Take for 12 weeks   07/07/11  [provider]  omeprazole (PRILOSEC) 20 MG capsule Take 20 mg by mouth daily.    07/07/11  [provider]  pravastatin (PRAVACHOL) 20 MG tablet Take 1 tablet (20 mg total) by mouth at bedtime. Patient not taking: Reported on 08/24/2015 05/12/14 08/24/15  Ward, Layla Maw, DO  QUEtiapine (SEROQUEL) 100 MG tablet Take 100 mg by mouth daily.   07/07/11  [provider]  ziprasidone (GEODON) 60 MG capsule Take 1 capsule (60 mg total) by mouth 2 (two) times daily with a meal. Patient not taking: Reported on 08/24/2015 05/12/14 08/24/15  Ward, Layla Maw, DO   Physical Exam: Blood pressure (!) 147/90, pulse (!) 142, resp. rate (!) 30, height 5\' 5"  (1.651 m), weight 92.3 kg (203 lb 8 oz), SpO2 99 %. 1. General:  in   Acute distress   acutely ill -appearing 2. Psychological: Alert and  Oriented 3. Head/ENT:     Dry Mucous Membranes, thrush                          Head Non traumatic, neck supple                           Poor Dentition                            4. SKIN:  decreased Skin turgor,  Skin clean Dry and intact no rash 5. Heart: Regular rate and rhythm no Murmur, no Rub or gallop 6. Lungs:  Clear to auscultation bilaterally, no wheezes or crackles breathing rapidly  Kussmaul respirations present,  7. Abdomen: Soft,  non-tender, Non distended  Obese bowel sounds present 8. Lower extremities: no clubbing, cyanosis, or   edema 9. Neurologically Grossly intact, moving all 4 extremities equally  10. MSK: Normal range of motion   LABS:     Recent Labs  Lab 01/03/18 1245 01/03/18 1449  WBC 18.9*  --   NEUTROABS 15.8*  --   HGB 15.3* 16.3*  HCT 45.2 48.0*  MCV 87.3  --   PLT 281  --    Basic Metabolic Panel: Recent Labs  Lab  01/03/18 1449  NA 137  K 4.7  CL 113*  GLUCOSE 489*  BUN 42*  CREATININE 1.00      No results for input(s): AST, ALT, ALKPHOS, BILITOT, PROT, ALBUMIN in the last 168 hours. No results for input(s): LIPASE, AMYLASE in the last 168 hours. No results for input(s): AMMONIA in the last 168 hours.    HbA1C: No results for input(s): HGBA1C in the last 72 hours. CBG: Recent Labs  Lab 01/03/18 1826  GLUCAP 487*  Urine analysis:    Component Value Date/Time   COLORURINE YELLOW 10/25/2015 2050   APPEARANCEUR CLOUDY (A) 10/25/2015 2050   LABSPEC 1.006 10/25/2015 2050   PHURINE 6.0 10/25/2015 2050   GLUCOSEU NEGATIVE 10/25/2015 2050   HGBUR NEGATIVE 10/25/2015 2050   HGBUR trace-intact 11/25/2008 1530   BILIRUBINUR NEGATIVE 10/25/2015 2050   KETONESUR NEGATIVE 10/25/2015 2050   PROTEINUR NEGATIVE 10/25/2015 2050   UROBILINOGEN 0.2 05/12/2014 2315   NITRITE NEGATIVE 10/25/2015 2050   LEUKOCYTESUR SMALL (A) 10/25/2015 2050      Cultures:    Component Value Date/Time   SDES ABSCESS 06/07/2017 1631   SPECREQUEST NONE 06/07/2017 1631   CULT ABUNDANT STAPHYLOCOCCUS AUREUS 06/07/2017 1631   REPTSTATUS 06/09/2017 FINAL 06/07/2017 1631     Radiological Exams on Admission: Dg Chest 2 View  Result Date: 01/03/2018 CLINICAL DATA:  Shortness of breath. EXAM: CHEST - 2 VIEW COMPARISON:  01/19/2016 FINDINGS: Cardiomediastinal silhouette is normal. Mediastinal contours appear intact. There is no evidence of focal airspace consolidation or pneumothorax. Elevation of the right hemidiaphragm. Osseous structures are without acute abnormality. Soft tissues are grossly normal. IMPRESSION: Elevation of the right hemidiaphragm may represent eventration of the diaphragm or less likely subpulmonic pleural effusion. Confirmation with decubital radiograph, right side down, may be considered if found clinically indicated. Otherwise no evidence airspace consolidation. Electronically Signed   By:  Ted Mcalpine M.D.   On: 01/03/2018 13:17   Ct Angio Chest Pe W/cm &/or Wo Cm  Result Date: 01/03/2018 CLINICAL DATA:  Shortness of breath for several days, possible inhalation injury EXAM: CT ANGIOGRAPHY CHEST WITH CONTRAST TECHNIQUE: Multidetector CT imaging of the chest was performed using the standard protocol during bolus administration of intravenous contrast. Multiplanar CT image reconstructions and MIPs were obtained to evaluate the vascular anatomy. The examination is somewhat limited by patient motion artifact. CONTRAST:  ISOVUE-370 IOPAMIDOL (ISOVUE-370) INJECTION 76% COMPARISON:  None. FINDINGS: Cardiovascular: Thoracic aorta shows a normal branching pattern. No aneurysmal dilatation or dissection is seen. No significant cardiac enlargement is noted. Mild coronary calcifications are seen. The pulmonary artery shows a normal branching pattern without evidence of filling defect to suggest pulmonary embolism. Mediastinum/Nodes: Thoracic inlet is within normal limits. No sizable hilar or mediastinal adenopathy is noted. The esophagus is within normal limits. Lungs/Pleura: Lungs are well aerated bilaterally. No focal infiltrate or sizable effusion is seen. No sizable parenchymal nodule is noted. Upper Abdomen: Fatty infiltration of the liver is noted. The remainder of the upper abdomen is within normal limits. Musculoskeletal: Degenerative changes of the thoracic spine are noted. Review of the MIP images confirms the above findings. IMPRESSION: No evidence of pulmonary emboli. No acute abnormality is noted. Electronically Signed   By: Alcide Clever M.D.   On: 01/03/2018 15:27    Chart has been reviewed    Assessment/Plan  47 y.o. female with medical history significant of DM 2 poorly controlled, obesity, tobacco abuse, bipolar disorder     Admitted for  DKA  Present on Admission:  . DKA, type 2, not at goal [facility]) - will admit per DKA protocol, obtain serial BMET, start on  glucosestabalizer, aggressive IVF.   Will work up cause of DKA with CXR, ECG one set of cardiac enzymes, UA. Monitor in [place]. Replace potassium as needed.     . Essential hypertension patient has not been compliant with home medications hold off for now will need to reassess would benefit from care management consult with help with home medication management .  Tobacco use disorder -spoke about importance of quitting patient has allergies to nicotine apparently . Thrush order nystatin suspension . Bipolar disorder with moderate depression (HCC) order behavioral health consult to help with medication stabilization History of asthma continue Xopenex as needed currently no wheezing suspect rapid respirations with secondary to exclude small respirations rather than asthma exacerbation Other plan as per orders.  DVT prophylaxis:    Lovenox     Code Status:   DNR/DNI   as per patient   I had personally discussed CODE STATUS with patient patient confirms she would like to be DNR   Family Communication:   Family no  at  Bedside    Disposition Plan:      To home once workup is complete and patient is stable                   Diabetes coordinator consulted                   Behavioral health  consulted                      Admission status:  obs   Level of care      SDU         Omaria Plunk 01/03/2018, 9:07 PM    [facility]  Pager [phone]   after 2 AM please page floor coverage PA If 7AM-7PM, please contact the day team taking care of the patient  [email or web address]  Password [email or web address]

## 2018-01-03 NOTE — Progress Notes (Signed)
CRITICAL VALUE ALERT  Critical Value:  Troponin 0.05  Date & Time Notied:  01/03/18 2334  Provider Notified: X. Blount.  Orders Received/Actions taken: Awaiting Orders.

## 2018-01-03 NOTE — ED Provider Notes (Signed)
5:21 PM Assumed care from Dr. Effie Shy, please see their note for full history, physical and decision making until this point. In brief this is a 47 y.o. year old female who presented to the ED tonight with Shortness of Breath     Here with tachypnea. Clear lung sounds. Ct scan negative. Slightly hypoxic on arrival getting breathing treatments.   On my exam, tachypneic, tachycardic, normotensive. Clear lungs.   Review of labs with acidosis, hyperglycemia. H/o insulin use and noncompliant recently. Suspect she is in DKA. Will treat appropriately.   Will need to be admitted, will add on urine and other labs prior to admission. Insulin started.    CRITICAL CARE Performed by: Marily Memos Total critical care time: 35 minutes Critical care time was exclusive of separately billable procedures and treating other patients. Critical care was necessary to treat or prevent imminent or life-threatening deterioration. Critical care was time spent personally by me on the following activities: development of treatment plan with patient and/or surrogate as well as nursing, discussions with consultants, evaluation of patient's response to treatment, examination of patient, obtaining history from patient or surrogate, ordering and performing treatments and interventions, ordering and review of laboratory studies, ordering and review of radiographic studies, pulse oximetry and re-evaluation of patient's condition.   Labs, studies and imaging reviewed by myself and considered in medical decision making if ordered. Imaging interpreted by radiology.  Labs Reviewed  CBC WITH DIFFERENTIAL/PLATELET - Abnormal; Notable for the following components:      Result Value   WBC 18.9 (*)    RBC 5.18 (*)    Hemoglobin 15.3 (*)    Neutro Abs 15.8 (*)    Monocytes Absolute 1.3 (*)    All other components within normal limits  BLOOD GAS, VENOUS - Abnormal; Notable for the following components:   pH, Ven 7.225 (*)    pCO2,  Ven 21.0 (*)    pO2, Ven 50.4 (*)    Bicarbonate 8.4 (*)    All other components within normal limits  I-STAT CHEM 8, ED - Abnormal; Notable for the following components:   Chloride 113 (*)    BUN 42 (*)    Glucose, Bld 489 (*)    TCO2 9 (*)    Hemoglobin 16.3 (*)    HCT 48.0 (*)    All other components within normal limits    CT Angio Chest PE W/Cm &/Or Wo Cm  Final Result    DG Chest 2 View  Final Result      No follow-ups on file.    Marily Memos, MD 01/03/18 [phone]

## 2018-01-03 NOTE — ED Notes (Signed)
ED TO INPATIENT HANDOFF REPORT  Name/Age/Gender [name] [name] 47 y.o. female  Code Status Code Status History    Date Active Date Inactive Code Status Order ID Comments User Context   06/07/2017 1717 06/10/2017 2018 DNR [id number]  Calvert Cantor, MD ED   08/29/2015 1843 09/01/2015 1613 Full Code [id number]  Adonis Brook, NP Inpatient   08/29/2015 1840 08/29/2015 1843 Full Code [id number]  Oneta Rack, NP Inpatient   08/29/2015 1512 08/29/2015 1840 Full Code [id number]  Oneta Rack, NP Inpatient   08/28/2015 1828 08/29/2015 1512 Full Code [id number]  Jeralyn Bennett, MD Inpatient   08/28/2015 1755 08/28/2015 1828 Full Code [id number]  Jeralyn Bennett, MD Inpatient   08/27/2015 1733 08/28/2015 1755 Full Code [id number]  Earney Navy, NP Inpatient   08/24/2015 2208 08/27/2015 1733 Full Code [id number]  Jaynie Crumble, PA-C ED   04/29/2014 0953 04/30/2014 1748 Full Code [id number]  Derwood Kaplan, MD ED   04/29/2014 0641 04/29/2014 0953 Full Code [id number]  Olivia Mackie, MD ED   04/29/2014 0220 04/29/2014 0641 Full Code [id number]  Olivia Mackie, MD ED   07/15/2012 1509 07/16/2012 2002 Full Code [id number]  Lowella Curb, RN Inpatient   07/14/2012 2152 07/15/2012 1509 Full Code [id number]  Loren Racer, MD ED    Questions for Most Recent Historical Code Status (Order [id number])    Question Answer Comment   In the event of cardiac or respiratory ARREST Do not call a "code blue"    In the event of cardiac or respiratory ARREST Do not perform Intubation, CPR, defibrillation or ACLS    In the event of cardiac or respiratory ARREST Use medication by any route, position, wound care, and other measures to relive pain and suffering. May use oxygen, suction and manual treatment of airway obstruction as needed for comfort.       Home/SNF/Other Home  Chief Complaint Shortness of Breath  Level of Care/Admitting Diagnosis ED Disposition    ED Disposition Condition Comment   Admit  Hospital  Area: [facility] [[id number]]  Level of Care: Stepdown [14]  Admit to SDU based on following criteria: Hemodynamic compromise or significant risk of instability:  Patient requiring short term acute titration and management of vasoactive drips, and invasive monitoring (i.e., CVP and Arterial line).  Diagnosis: DKA, type 2, not at goal [facility]) [[id number]]  Admitting Physician: Therisa Doyne [[id number]]  Attending Physician: Therisa Doyne [[id number]]  PT Class (Do Not Modify): Observation [104]  PT Acc Code (Do Not Modify): Observation [10022]       Medical History Past Medical History:  Diagnosis Date  . Anxiety disorder   . Asthma   . Bipolar affective disorder (HCC)   . Depression   . Diabetes mellitus   . Migraine   . Schizophrenia (HCC)     Allergies Allergies  Allergen Reactions  . Tramadol Nausea Only    "feeling like I'm on fire"  . Nicotine     Patient states she is allergic to the Nicotine patch and nicotine gum and they cause her to break out in a rash.  She states she can smoke cigarettes.    IV Location/Drains/Wounds Patient Lines/Drains/Airways Status   Active Line/Drains/Airways    Name:   Placement date:   Placement time:   Site:   Days:   Peripheral IV 01/03/18 Left Antecubital   01/03/18    1210    Antecubital   less than 1   Peripheral IV 01/03/18 Right Forearm  01/03/18    1841    Forearm   less than 1   Peripheral IV 01/03/18 Left Hand   01/03/18    1743    Hand   less than 1   Wound / Incision (Open or Dehisced) 06/07/17   06/07/17    1903    -   210          Labs/Imaging Results for orders placed or performed during the hospital encounter of 01/03/18 (from the past 48 hour(s))  CBC with Differential     Status: Abnormal   Collection Time: 01/03/18 12:45 PM  Result Value Ref Range   WBC 18.9 (H) 4.0 - 10.5 K/uL   RBC 5.18 (H) 3.87 - 5.11 MIL/uL   Hemoglobin 15.3 (H) 12.0 - 15.0 g/dL   HCT 09.8 11.9 - 14.7 %   MCV 87.3 78.0 -  100.0 fL   MCH 29.5 26.0 - 34.0 pg   MCHC 33.8 30.0 - 36.0 g/dL   RDW 82.9 56.2 - 13.0 %   Platelets 281 150 - 400 K/uL   Neutrophils Relative % 84 %   Neutro Abs 15.8 (H) 1.7 - 7.7 K/uL   Lymphocytes Relative 9 %   Lymphs Abs 1.8 0.7 - 4.0 K/uL   Monocytes Relative 7 %   Monocytes Absolute 1.3 (H) 0.1 - 1.0 K/uL   Eosinophils Relative 0 %   Eosinophils Absolute 0.0 0.0 - 0.7 K/uL   Basophils Relative 0 %   Basophils Absolute 0.0 0.0 - 0.1 K/uL    Comment: Performed at [facility], [address], [place], [place] [address]  Blood gas, venous     Status: Abnormal (Preliminary result)   Collection Time: 01/03/18  2:35 PM  Result Value Ref Range   pH, Ven 7.225 (L) 7.250 - 7.430   pCO2, Ven 21.0 (L) 44.0 - 60.0 mmHg   pO2, Ven 50.4 (H) 32.0 - 45.0 mmHg   Bicarbonate 8.4 (L) 20.0 - 28.0 mmol/L   O2 Saturation 80.4 %   Patient temperature 37.0    Collection site VENOUS    Drawn by DRAWN BY RN    Sample type VENOUS     Comment: Performed at [facility], [address] [place], [place] [address]   Mechanical Rate PENDING   I-stat Chem 8, ED     Status: Abnormal   Collection Time: 01/03/18  2:49 PM  Result Value Ref Range   Sodium 137 135 - 145 mmol/L   Potassium 4.7 3.5 - 5.1 mmol/L   Chloride 113 (H) 98 - 111 mmol/L   BUN 42 (H) 6 - 20 mg/dL   Creatinine, Ser 9.52 0.44 - 1.00 mg/dL   Glucose, Bld 841 (H) 70 - 99 mg/dL   Calcium, Ion 3.24 1.15 - 1.40 mmol/L   TCO2 9 (L) 22 - 32 mmol/L   Hemoglobin 16.3 (H) 12.0 - 15.0 g/dL   HCT 40.1 (H) 02.7 - 25.3 %  I-Stat beta hCG blood, ED     Status: None   Collection Time: 01/03/18  6:25 PM  Result Value Ref Range   I-stat hCG, quantitative <5.0 <5 mIU/mL   Comment 3            Comment:   GEST. AGE      CONC.  (mIU/mL)   <=1 WEEK        5 - 50     2 WEEKS       50 -  500     3 WEEKS       100 - 10,000     4 WEEKS     1,000 - 30,000        FEMALE AND NON-PREGNANT FEMALE:     LESS THAN 5  mIU/mL   CBG monitoring, ED     Status: Abnormal   Collection Time: 01/03/18  6:26 PM  Result Value Ref Range   Glucose-Capillary 487 (H) 70 - 99 mg/dL  POC CBG, ED     Status: Abnormal   Collection Time: 01/03/18  7:45 PM  Result Value Ref Range   Glucose-Capillary 401 (H) 70 - 99 mg/dL   Dg Chest 2 View  Result Date: 01/03/2018 CLINICAL DATA:  Shortness of breath. EXAM: CHEST - 2 VIEW COMPARISON:  01/19/2016 FINDINGS: Cardiomediastinal silhouette is normal. Mediastinal contours appear intact. There is no evidence of focal airspace consolidation or pneumothorax. Elevation of the right hemidiaphragm. Osseous structures are without acute abnormality. Soft tissues are grossly normal. IMPRESSION: Elevation of the right hemidiaphragm may represent eventration of the diaphragm or less likely subpulmonic pleural effusion. Confirmation with decubital radiograph, right side down, may be considered if found clinically indicated. Otherwise no evidence airspace consolidation. Electronically Signed   By: Ted Mcalpine M.D.   On: 01/03/2018 13:17   Ct Angio Chest Pe W/cm &/or Wo Cm  Result Date: 01/03/2018 CLINICAL DATA:  Shortness of breath for several days, possible inhalation injury EXAM: CT ANGIOGRAPHY CHEST WITH CONTRAST TECHNIQUE: Multidetector CT imaging of the chest was performed using the standard protocol during bolus administration of intravenous contrast. Multiplanar CT image reconstructions and MIPs were obtained to evaluate the vascular anatomy. The examination is somewhat limited by patient motion artifact. CONTRAST:  ISOVUE-370 IOPAMIDOL (ISOVUE-370) INJECTION 76% COMPARISON:  None. FINDINGS: Cardiovascular: Thoracic aorta shows a normal branching pattern. No aneurysmal dilatation or dissection is seen. No significant cardiac enlargement is noted. Mild coronary calcifications are seen. The pulmonary artery shows a normal branching pattern without evidence of filling defect to suggest  pulmonary embolism. Mediastinum/Nodes: Thoracic inlet is within normal limits. No sizable hilar or mediastinal adenopathy is noted. The esophagus is within normal limits. Lungs/Pleura: Lungs are well aerated bilaterally. No focal infiltrate or sizable effusion is seen. No sizable parenchymal nodule is noted. Upper Abdomen: Fatty infiltration of the liver is noted. The remainder of the upper abdomen is within normal limits. Musculoskeletal: Degenerative changes of the thoracic spine are noted. Review of the MIP images confirms the above findings. IMPRESSION: No evidence of pulmonary emboli. No acute abnormality is noted. Electronically Signed   By: Alcide Clever M.D.   On: 01/03/2018 15:27    Pending Labs Unresulted Labs (From admission, onward)   Start     Ordered   01/03/18 2000  Basic metabolic panel  Once,   STAT     01/03/18 2000   01/03/18 1938  Urine rapid drug screen (hosp performed)  STAT,   R     01/03/18 1937   01/03/18 1935  Lactic acid, plasma  Once,   STAT     01/03/18 1934   01/03/18 1935  Troponin I (q 6hr x 3)  Now then every 6 hours,   R     01/03/18 1934   01/03/18 1932  Beta-hydroxybutyric acid  Add-on,   R     01/03/18 1931   01/03/18 1931  Magnesium  Add-on,   R     01/03/18 1930   01/03/18 1931  Phosphorus  Add-on,   R     01/03/18 1930   01/03/18 1720  Urinalysis, Routine w reflex microscopic  STAT,   STAT     01/03/18 1720   Signed and Held  Basic metabolic panel  STAT Now then every 4 hours ,   STAT     Signed and Held   Signed and Held  Hemoglobin A1c  Tomorrow morning,   R     Signed and Held      Vitals/Pain Today's Vitals   01/03/18 1800 01/03/18 1837 01/03/18 1838 01/03/18 1900  BP: (!) 153/95   (!) 153/79  Pulse: (!) 132   (!) 136  Resp: (!) 28   (!) 32  SpO2: 100%   100%  Weight:   203 lb 8 oz (92.3 kg)   Height:  5\' 5"  (1.651 m) 5\' 5"  (1.651 m)   PainSc:        Isolation Precautions No active isolations  Medications Medications  0.9 %   sodium chloride infusion ( Intravenous New Bag/Given 01/03/18 1448)  iopamidol (ISOVUE-370) 76 % injection (has no administration in time range)  insulin regular (NOVOLIN R,HUMULIN R) 100 Units in sodium chloride 0.9 % 100 mL (1 Units/mL) infusion (3.4 Units/hr Intravenous Rate/Dose Change 01/03/18 1947)  potassium chloride 10 mEq in 100 mL IVPB (10 mEq Intravenous New Bag/Given 01/03/18 2025)  dextrose 5 %-0.45 % sodium chloride infusion (has no administration in time range)  LORazepam (ATIVAN) injection 1 mg (1 mg Intravenous Given 01/03/18 1448)  iopamidol (ISOVUE-370) 76 % injection 100 mL (100 mLs Intravenous Contrast Given 01/03/18 1508)  levofloxacin (LEVAQUIN) IVPB 750 mg (0 mg Intravenous Stopped 01/03/18 1845)  ipratropium-albuterol (DUONEB) 0.5-2.5 (3) MG/3ML nebulizer solution 3 mL (3 mLs Nebulization Given 01/03/18 1712)  lactated ringers bolus 1,000 mL (0 mLs Intravenous Stopped 01/03/18 2020)  ondansetron (ZOFRAN) injection 4 mg (4 mg Intravenous Given 01/03/18 1850)  magic mouthwash (15 mLs Oral Given 01/03/18 1842)    Mobility walks

## 2018-01-03 NOTE — ED Notes (Signed)
Pt remains confused and rambling in conversation. She does state at intervals she feels better.

## 2018-01-03 NOTE — ED Provider Notes (Signed)
[name] [name]   CSN: [id number] Arrival date & time: 01/03/18  1219     History   Chief Complaint Chief Complaint  Patient presents with  . Shortness of Breath    HPI [name] [name] is a 47 y.o. female.  HPI   The patient presents for evaluation of shortness of breath which she states started several days ago and is worsening despite home treatments and treatments by EMS.  EMS was called to her home 4 days ago and at that time they treated her with 2 albuterol nebulizers and IV Solu-Medrol.  She refused transport at that time.  Since then she continues to smoke cigarettes and use her usual medicines at home which include albuterol inhaler.  She denies cough, fever, chills, nausea, vomiting, weakness or dizziness.  She gets more shortness of breath with mild exertion.  No prior history of venous thromboembolism.  There are no other known modifying factors.  Past Medical History:  Diagnosis Date  . Anxiety disorder   . Asthma   . Bipolar affective disorder (HCC)   . Depression   . Diabetes mellitus   . Migraine   . Schizophrenia [facility])     Patient Active Problem List   Diagnosis Date Noted  . DM (diabetes mellitus), type 2, [name] 06/07/2017  . Abscess of groin, right 06/07/2017  . Abscess 06/07/2017  . Bipolar disorder with moderate depression (HCC) 08/31/2015  . Tobacco use disorder 08/28/2015  . HTN (hypertension) 08/28/2015  . Diabetes (HCC) 08/28/2015  . Hyponatremia 08/28/2015  . DM 10/26/2009  . OBESITY 10/26/2009  . ESSENTIAL HYPERTENSION 10/26/2009  . DISTURBANCE OF SKIN SENSATION 10/26/2009  . MIGRAINE HEADACHE 03/05/2007    Past Surgical History:  Procedure Laterality Date  . KNEE ARTHROSCOPY     x 2  . KNEE SURGERY Left    Incision made and knee cleaned out  . TUBAL LIGATION       OB History   None      Home Medications    Prior to Admission medications   Medication Sig Start Date  End Date Taking? Authorizing Provider  acetaminophen (TYLENOL) 500 MG tablet Take 1,000 mg by mouth every 6 (six) hours as needed for mild pain or headache.    [provider]  albuterol (PROVENTIL HFA;VENTOLIN HFA) 108 (90 Base) MCG/ACT inhaler Inhale 2 puffs into the lungs every 6 (six) hours as needed for wheezing or shortness of breath. 06/10/17   Tyrone Nine, MD  Cholecalciferol (VITAMIN D PO) Take 1 tablet by mouth daily.    [provider]  clindamycin (CLEOCIN) 300 MG capsule Take 1 capsule (300 mg total) by mouth every 8 (eight) hours. Patient not taking: Reported on 01/03/2018 06/10/17   Tyrone Nine, MD  diphenhydrAMINE (BENADRYL) 25 MG tablet Take 25 mg by mouth every 6 (six) hours as needed for sleep.    [provider]  escitalopram (LEXAPRO) 5 MG tablet Take 1 tablet (5 mg total) by mouth daily. 06/11/17   Tyrone Nine, MD  HYDROcodone-acetaminophen (NORCO/VICODIN) 5-325 MG tablet Take 1 tablet by mouth every 6 (six) hours as needed for severe pain. 06/10/17   Tyrone Nine, MD  ibuprofen (ADVIL) 200 MG tablet Take 1 tablet (200 mg total) by mouth every 6 (six) hours as needed. Patient taking differently: Take 400 mg by mouth every 6 (six) hours as needed for headache.  01/19/16   Reymundo Poll, MD  insulin aspart  protamine - aspart (NOVOLOG MIX 70/30 FLEXPEN) (70-30) 100 UNIT/ML FlexPen Inject 0.15 mLs (15 Units total) into the skin 2 (two) times daily with a meal. 06/10/17   Tyrone Nine, MD  lisinopril (PRINIVIL,ZESTRIL) 5 MG tablet Take 1 tablet (5 mg total) by mouth daily. 06/11/17   Tyrone Nine, MD  metFORMIN (GLUCOPHAGE) 850 MG tablet Take 1 tablet (850 mg total) by mouth 2 (two) times daily. 06/10/17   Tyrone Nine, MD  Multiple Vitamin (MULTIVITAMIN WITH MINERALS) TABS tablet Take 1 tablet by mouth daily.    [provider]  ondansetron (ZOFRAN) 4 MG tablet Take 1 tablet (4 mg total) by mouth every 8 (eight) hours as needed for nausea or  vomiting. 06/10/17   Tyrone Nine, MD  divalproex (DEPAKOTE ER) 500 MG 24 hr tablet Take 2 tablets (1,000 mg total) by mouth at bedtime. Patient not taking: Reported on 08/24/2015 05/12/14 08/24/15  Ward, Layla Maw, DO  DULoxetine (CYMBALTA) 60 MG capsule Take 60 mg by mouth daily.  05/21/12  [provider]  ergocalciferol (VITAMIN D2) 50000 UNIT capsule Take 50,000 Units by mouth once a week. Take for 12 weeks   07/07/11  [provider]  omeprazole (PRILOSEC) 20 MG capsule Take 20 mg by mouth daily.    07/07/11  [provider]  pravastatin (PRAVACHOL) 20 MG tablet Take 1 tablet (20 mg total) by mouth at bedtime. Patient not taking: Reported on 08/24/2015 05/12/14 08/24/15  Ward, Layla Maw, DO  QUEtiapine (SEROQUEL) 100 MG tablet Take 100 mg by mouth daily.   07/07/11  [provider]  ziprasidone (GEODON) 60 MG capsule Take 1 capsule (60 mg total) by mouth 2 (two) times daily with a meal. Patient not taking: Reported on 08/24/2015 05/12/14 08/24/15  Ward, Layla Maw, DO    Family History Family History  Problem Relation Age of Onset  . Bipolar disorder Mother   . Hypertension Father   . Diabetes Father     Social History Social History   Tobacco Use  . Smoking status: Current Every Day Smoker    Packs/day: 1.00    Years: 29.00    Pack years: 29.00    Types: Cigarettes  . Smokeless tobacco: Never Used  Substance Use Topics  . Alcohol use: Yes    Comment: occasionally  . Drug use: No     Allergies   Tramadol and Nicotine   Review of Systems Review of Systems  All other systems reviewed and are negative.    Physical Exam Updated Vital Signs BP (!) 147/90   Pulse (!) 142   Resp (!) 30   Ht 5\' 5"  (1.651 m)   Wt 92.3 kg (203 lb 8 oz)   LMP  (LMP Unknown)   SpO2 99%   BMI 33.86 kg/m   Physical Exam  Constitutional: She is oriented to person, place, and time. She appears well-developed. She appears ill. She appears distressed (She is  uncomfortable).  Overweight  HENT:  Head: Normocephalic and atraumatic.  Eyes: Pupils are equal, round, and reactive to light. Conjunctivae and EOM are normal.  Neck: Normal range of motion and phonation normal. Neck supple.  Cardiovascular: Regular rhythm.  Tachycardia  Pulmonary/Chest: Effort normal and breath sounds normal. No stridor. No respiratory distress. She has no wheezes. She exhibits no tenderness.  Tachypnea  Abdominal: Soft. She exhibits no distension. There is no tenderness. There is no guarding.  Musculoskeletal: Normal range of motion. She exhibits no edema, tenderness  or deformity.  Neurological: She is alert and oriented to person, place, and time. She exhibits normal muscle tone.  Skin: Skin is warm and dry.  Psychiatric: She has a normal mood and affect. Her behavior is normal. Judgment and thought content normal.  Nursing [name] and vitals reviewed.    ED Treatments / Results  Labs (all labs ordered are listed, but only abnormal results are displayed) Labs Reviewed  CBC WITH DIFFERENTIAL/PLATELET - Abnormal; Notable for the following components:      Result Value   WBC 18.9 (*)    RBC 5.18 (*)    Hemoglobin 15.3 (*)    Neutro Abs 15.8 (*)    Monocytes Absolute 1.3 (*)    All other components within normal limits  BLOOD GAS, VENOUS - Abnormal; Notable for the following components:   pH, Ven 7.225 (*)    pCO2, Ven 21.0 (*)    pO2, Ven 50.4 (*)    Bicarbonate 8.4 (*)    All other components within normal limits  I-STAT CHEM 8, ED - Abnormal; Notable for the following components:   Chloride 113 (*)    BUN 42 (*)    Glucose, Bld 489 (*)    TCO2 9 (*)    Hemoglobin 16.3 (*)    HCT 48.0 (*)    All other components within normal limits  CBG MONITORING, ED - Abnormal; Notable for the following components:   Glucose-Capillary 487 (*)    All other components within normal limits  BASIC METABOLIC PANEL  URINALYSIS, ROUTINE W REFLEX MICROSCOPIC  I-STAT BETA  HCG BLOOD, ED ([facility], [facility], [facility] ONLY)  CBG MONITORING, ED    EKG EKG Interpretation  Date/Time:  Saturday January 03 2018 12:46:13 EDT Ventricular Rate:  127 PR Interval:  134 QRS Duration: 84 QT Interval:  336 QTC Calculation: 488 R Axis:   -22 Text Interpretation:  Sinus tachycardia Right atrial enlargement Cannot rule out Anterior infarct , age undetermined Abnormal ECG Since last tracing rate faster Reconfirmed by Mancel Bale [phone]) on 01/03/2018 1:35:39 PM   Radiology Dg Chest 2 View  Result Date: 01/03/2018 CLINICAL DATA:  Shortness of breath. EXAM: CHEST - 2 VIEW COMPARISON:  01/19/2016 FINDINGS: Cardiomediastinal silhouette is normal. Mediastinal contours appear intact. There is no evidence of focal airspace consolidation or pneumothorax. Elevation of the right hemidiaphragm. Osseous structures are without acute abnormality. Soft tissues are grossly normal. IMPRESSION: Elevation of the right hemidiaphragm may represent eventration of the diaphragm or less likely subpulmonic pleural effusion. Confirmation with decubital radiograph, right side down, may be considered if found clinically indicated. Otherwise no evidence airspace consolidation. Electronically Signed   By: Ted Mcalpine M.D.   On: 01/03/2018 13:17   Ct Angio Chest Pe W/cm &/or Wo Cm  Result Date: 01/03/2018 CLINICAL DATA:  Shortness of breath for several days, possible inhalation injury EXAM: CT ANGIOGRAPHY CHEST WITH CONTRAST TECHNIQUE: Multidetector CT imaging of the chest was performed using the standard protocol during bolus administration of intravenous contrast. Multiplanar CT image reconstructions and MIPs were obtained to evaluate the vascular anatomy. The examination is somewhat limited by patient motion artifact. CONTRAST:  ISOVUE-370 IOPAMIDOL (ISOVUE-370) INJECTION 76% COMPARISON:  None. FINDINGS: Cardiovascular: Thoracic aorta shows a normal branching pattern. No aneurysmal dilatation or dissection is  seen. No significant cardiac enlargement is noted. Mild coronary calcifications are seen. The pulmonary artery shows a normal branching pattern without evidence of filling defect to suggest pulmonary embolism. Mediastinum/Nodes: Thoracic inlet is within normal limits.  No sizable hilar or mediastinal adenopathy is noted. The esophagus is within normal limits. Lungs/Pleura: Lungs are well aerated bilaterally. No focal infiltrate or sizable effusion is seen. No sizable parenchymal nodule is noted. Upper Abdomen: Fatty infiltration of the liver is noted. The remainder of the upper abdomen is within normal limits. Musculoskeletal: Degenerative changes of the thoracic spine are noted. Review of the MIP images confirms the above findings. IMPRESSION: No evidence of pulmonary emboli. No acute abnormality is noted. Electronically Signed   By: Alcide Clever M.D.   On: 01/03/2018 15:27    Procedures .Critical Care Performed by: Mancel Bale, MD Authorized by: Mancel Bale, MD   Critical care provider statement:    Critical care time (minutes):  45   Critical care start time:  01/03/2018 1:30 PM   Critical care end time:  01/03/2018 5:10 PM   Critical care was necessary to treat or prevent imminent or life-threatening deterioration of the following conditions:  Metabolic crisis and respiratory failure   Critical care was time spent personally by me on the following activities:  Discussions with consultants, evaluation of patient's response to treatment, examination of patient, ordering and performing treatments and interventions, ordering and review of laboratory studies, ordering and review of radiographic studies, pulse oximetry, re-evaluation of patient's condition, obtaining history from patient or surrogate and review of old charts   (including critical care time)  Medications Ordered in ED Medications  0.9 %  sodium chloride infusion ( Intravenous New Bag/Given 01/03/18 1448)  iopamidol (ISOVUE-370) 76  % injection (has no administration in time range)  insulin regular (NOVOLIN R,HUMULIN R) 100 Units in sodium chloride 0.9 % 100 mL (1 Units/mL) infusion (4.3 Units/hr Intravenous New Bag/Given 01/03/18 1841)  lactated ringers bolus 1,000 mL (has no administration in time range)  potassium chloride 10 mEq in 100 mL IVPB (has no administration in time range)  dextrose 5 %-0.45 % sodium chloride infusion (has no administration in time range)  ondansetron (ZOFRAN) injection 4 mg (has no administration in time range)  LORazepam (ATIVAN) injection 1 mg (1 mg Intravenous Given 01/03/18 1448)  iopamidol (ISOVUE-370) 76 % injection 100 mL (100 mLs Intravenous Contrast Given 01/03/18 1508)  levofloxacin (LEVAQUIN) IVPB 750 mg (750 mg Intravenous New Bag/Given 01/03/18 1701)  ipratropium-albuterol (DUONEB) 0.5-2.5 (3) MG/3ML nebulizer solution 3 mL (3 mLs Nebulization Given 01/03/18 1712)  magic mouthwash (15 mLs Oral Given 01/03/18 1842)     Initial Impression / Assessment and Plan / ED Course  I have reviewed the triage vital signs and the nursing notes.  Pertinent labs & imaging results that were available during my care of the patient were reviewed by me and considered in my medical decision making (see chart for details).  Clinical Course as of Jan 04 1843  Sat Jan 03, 2018  1717 pH low, PCO2 low, PO2 high on venous gas  Blood gas, venous(!) [EW]  1717 Elevated WBC and hemoglobin, possibly consistent with hemoconcentration.  CBC with Differential(!) [EW]  1717 Normal except quite high, BUN high, glucose high, CO2 low  I-stat Chem 8, ED(!) [EW]    Clinical Course User Index [EW] Mancel Bale, MD     Patient Vitals for the past 24 hrs:  BP Pulse Resp SpO2 Height Weight  01/03/18 1838 - - - - 5\' 5"  (1.651 m) 92.3 kg (203 lb 8 oz)  01/03/18 1837 - - - - 5\' 5"  (1.651 m) -  01/03/18 1712 (!) 147/90 (!) 142 (!) 30 99 % - -  01/03/18 1700 (!) 158/97 (!) 149 (!) 34 96 % - -  01/03/18 1233 - - -  100 % - -  01/03/18 1230 - - - (!) 86 % - -      Medical Decision Making: Dyspnea with tachycardia and tachypnea. No wheezing. No PE on CT imaging. Anion gap acidosos with hyperglycemia.   CRITICAL CARE- yes Performed by: Mancel Bale    Nursing Notes Reviewed/ Care Coordinated Applicable Imaging Reviewed Interpretation of Laboratory Data incorporated into ED treatment   Plan: Care to Dr. Clayborne Dana to initiate further treatment and arrange admission.    Final Clinical Impressions(s) / ED Diagnoses   Final diagnoses:  Diabetic ketoacidosis without coma associated with other specified diabetes mellitus (HCC)  Oral thrush    ED Discharge Orders    None       Mancel Bale, MD 01/03/18 [phone]

## 2018-01-04 DIAGNOSIS — F4321 Adjustment disorder with depressed mood: Secondary | ICD-10-CM

## 2018-01-04 DIAGNOSIS — J45909 Unspecified asthma, uncomplicated: Secondary | ICD-10-CM | POA: Diagnosis present

## 2018-01-04 DIAGNOSIS — G43909 Migraine, unspecified, not intractable, without status migrainosus: Secondary | ICD-10-CM

## 2018-01-04 DIAGNOSIS — Z9114 Patient's other noncompliance with medication regimen: Secondary | ICD-10-CM | POA: Diagnosis not present

## 2018-01-04 DIAGNOSIS — B373 Candidiasis of vulva and vagina: Secondary | ICD-10-CM | POA: Diagnosis present

## 2018-01-04 DIAGNOSIS — Z66 Do not resuscitate: Secondary | ICD-10-CM | POA: Diagnosis present

## 2018-01-04 DIAGNOSIS — B3731 Acute candidiasis of vulva and vagina: Secondary | ICD-10-CM | POA: Diagnosis present

## 2018-01-04 DIAGNOSIS — B370 Candidal stomatitis: Secondary | ICD-10-CM

## 2018-01-04 DIAGNOSIS — Z818 Family history of other mental and behavioral disorders: Secondary | ICD-10-CM | POA: Diagnosis not present

## 2018-01-04 DIAGNOSIS — E1165 Type 2 diabetes mellitus with hyperglycemia: Secondary | ICD-10-CM

## 2018-01-04 DIAGNOSIS — R0602 Shortness of breath: Secondary | ICD-10-CM

## 2018-01-04 DIAGNOSIS — F319 Bipolar disorder, unspecified: Secondary | ICD-10-CM | POA: Diagnosis present

## 2018-01-04 DIAGNOSIS — N390 Urinary tract infection, site not specified: Secondary | ICD-10-CM

## 2018-01-04 DIAGNOSIS — E876 Hypokalemia: Secondary | ICD-10-CM | POA: Diagnosis present

## 2018-01-04 DIAGNOSIS — E1110 Type 2 diabetes mellitus with ketoacidosis without coma: Secondary | ICD-10-CM | POA: Diagnosis not present

## 2018-01-04 DIAGNOSIS — Z79899 Other long term (current) drug therapy: Secondary | ICD-10-CM | POA: Diagnosis not present

## 2018-01-04 DIAGNOSIS — Z885 Allergy status to narcotic agent status: Secondary | ICD-10-CM | POA: Diagnosis not present

## 2018-01-04 DIAGNOSIS — Z888 Allergy status to other drugs, medicaments and biological substances status: Secondary | ICD-10-CM | POA: Diagnosis not present

## 2018-01-04 DIAGNOSIS — F17210 Nicotine dependence, cigarettes, uncomplicated: Secondary | ICD-10-CM

## 2018-01-04 DIAGNOSIS — Z833 Family history of diabetes mellitus: Secondary | ICD-10-CM | POA: Diagnosis not present

## 2018-01-04 DIAGNOSIS — R0902 Hypoxemia: Secondary | ICD-10-CM | POA: Diagnosis present

## 2018-01-04 DIAGNOSIS — F3132 Bipolar disorder, current episode depressed, moderate: Secondary | ICD-10-CM | POA: Diagnosis not present

## 2018-01-04 DIAGNOSIS — I10 Essential (primary) hypertension: Secondary | ICD-10-CM | POA: Diagnosis present

## 2018-01-04 DIAGNOSIS — Z9119 Patient's noncompliance with other medical treatment and regimen: Secondary | ICD-10-CM | POA: Diagnosis not present

## 2018-01-04 DIAGNOSIS — E1310 Other specified diabetes mellitus with ketoacidosis without coma: Secondary | ICD-10-CM | POA: Diagnosis not present

## 2018-01-04 DIAGNOSIS — E669 Obesity, unspecified: Secondary | ICD-10-CM | POA: Diagnosis present

## 2018-01-04 DIAGNOSIS — E870 Hyperosmolality and hypernatremia: Secondary | ICD-10-CM | POA: Diagnosis not present

## 2018-01-04 DIAGNOSIS — Z794 Long term (current) use of insulin: Secondary | ICD-10-CM | POA: Diagnosis not present

## 2018-01-04 DIAGNOSIS — F17200 Nicotine dependence, unspecified, uncomplicated: Secondary | ICD-10-CM

## 2018-01-04 LAB — CBC WITH DIFFERENTIAL/PLATELET
Basophils Absolute: 0 10*3/uL (ref 0.0–0.1)
Basophils Relative: 0 %
EOS ABS: 0 10*3/uL (ref 0.0–0.7)
Eosinophils Relative: 0 %
HCT: 39.7 % (ref 36.0–46.0)
HEMOGLOBIN: 13.3 g/dL (ref 12.0–15.0)
LYMPHS ABS: 0.8 10*3/uL (ref 0.7–4.0)
LYMPHS PCT: 6 %
MCH: 29 pg (ref 26.0–34.0)
MCHC: 33.5 g/dL (ref 30.0–36.0)
MCV: 86.7 fL (ref 78.0–100.0)
MONOS PCT: 6 %
Monocytes Absolute: 0.8 10*3/uL (ref 0.1–1.0)
NEUTROS ABS: 11.7 10*3/uL — AB (ref 1.7–7.7)
NEUTROS PCT: 88 %
Platelets: 247 10*3/uL (ref 150–400)
RBC: 4.58 MIL/uL (ref 3.87–5.11)
RDW: 14.6 % (ref 11.5–15.5)
WBC: 13.4 10*3/uL — ABNORMAL HIGH (ref 4.0–10.5)

## 2018-01-04 LAB — GLUCOSE, CAPILLARY
GLUCOSE-CAPILLARY: 122 mg/dL — AB (ref 70–99)
GLUCOSE-CAPILLARY: 142 mg/dL — AB (ref 70–99)
GLUCOSE-CAPILLARY: 155 mg/dL — AB (ref 70–99)
GLUCOSE-CAPILLARY: 224 mg/dL — AB (ref 70–99)
GLUCOSE-CAPILLARY: 243 mg/dL — AB (ref 70–99)
Glucose-Capillary: 121 mg/dL — ABNORMAL HIGH (ref 70–99)
Glucose-Capillary: 139 mg/dL — ABNORMAL HIGH (ref 70–99)
Glucose-Capillary: 136 mg/dL — ABNORMAL HIGH (ref 70–99)
Glucose-Capillary: 139 mg/dL — ABNORMAL HIGH (ref 70–99)
Glucose-Capillary: 207 mg/dL — ABNORMAL HIGH (ref 70–99)

## 2018-01-04 LAB — RAPID URINE DRUG SCREEN, HOSP PERFORMED
Amphetamines: POSITIVE — AB
BARBITURATES: NOT DETECTED
Benzodiazepines: POSITIVE — AB
Cocaine: NOT DETECTED
Opiates: NOT DETECTED
Tetrahydrocannabinol: NOT DETECTED

## 2018-01-04 LAB — BASIC METABOLIC PANEL
ANION GAP: 7 (ref 5–15)
ANION GAP: 10 (ref 5–15)
Anion gap: 8 (ref 5–15)
Anion gap: 10 (ref 5–15)
BUN: 32 mg/dL — ABNORMAL HIGH (ref 6–20)
BUN: 33 mg/dL — ABNORMAL HIGH (ref 6–20)
BUN: 32 mg/dL — ABNORMAL HIGH (ref 6–20)
BUN: 35 mg/dL — ABNORMAL HIGH (ref 6–20)
CHLORIDE: 118 mmol/L — AB (ref 98–111)
CHLORIDE: 112 mmol/L — AB (ref 98–111)
CO2: 15 mmol/L — ABNORMAL LOW (ref 22–32)
CO2: 18 mmol/L — ABNORMAL LOW (ref 22–32)
CO2: 17 mmol/L — AB (ref 22–32)
CO2: 17 mmol/L — AB (ref 22–32)
Calcium: 8.4 mg/dL — ABNORMAL LOW (ref 8.9–10.3)
Calcium: 8.7 mg/dL — ABNORMAL LOW (ref 8.9–10.3)
Calcium: 8.4 mg/dL — ABNORMAL LOW (ref 8.9–10.3)
Calcium: 8.3 mg/dL — ABNORMAL LOW (ref 8.9–10.3)
Chloride: 119 mmol/L — ABNORMAL HIGH (ref 98–111)
Chloride: 119 mmol/L — ABNORMAL HIGH (ref 98–111)
Creatinine, Ser: 0.98 mg/dL (ref 0.44–1.00)
Creatinine, Ser: 0.98 mg/dL (ref 0.44–1.00)
Creatinine, Ser: 0.83 mg/dL (ref 0.44–1.00)
Creatinine, Ser: 1.01 mg/dL — ABNORMAL HIGH (ref 0.44–1.00)
GFR calc Af Amer: >60 mL/min (ref >60)
GFR calc non Af Amer: >60 mL/min (ref >60)
GFR calc non Af Amer: >60 mL/min (ref >60)
GFR calc non Af Amer: >60 mL/min (ref >60)
Glucose, Bld: 131 mg/dL — ABNORMAL HIGH (ref 70–99)
Glucose, Bld: 156 mg/dL — ABNORMAL HIGH (ref 70–99)
Glucose, Bld: 207 mg/dL — ABNORMAL HIGH (ref 70–99)
Glucose, Bld: 211 mg/dL — ABNORMAL HIGH (ref 70–99)
POTASSIUM: 4.2 mmol/L (ref 3.5–5.1)
POTASSIUM: 4 mmol/L (ref 3.5–5.1)
Potassium: 4.5 mmol/L (ref 3.5–5.1)
Potassium: 4.4 mmol/L (ref 3.5–5.1)
SODIUM: 142 mmol/L (ref 135–145)
SODIUM: 144 mmol/L (ref 135–145)
SODIUM: 139 mmol/L (ref 135–145)
Sodium: 145 mmol/L (ref 135–145)

## 2018-01-04 LAB — COMPREHENSIVE METABOLIC PANEL
ALBUMIN: 3.3 g/dL — AB (ref 3.5–5.0)
ALT: 20 U/L (ref 0–44)
AST: 12 U/L — AB (ref 15–41)
Alkaline Phosphatase: 83 U/L (ref 38–126)
Anion gap: 7 (ref 5–15)
BUN: 33 mg/dL — ABNORMAL HIGH (ref 6–20)
CHLORIDE: 119 mmol/L — AB (ref 98–111)
CO2: 16 mmol/L — AB (ref 22–32)
CREATININE: 0.92 mg/dL (ref 0.44–1.00)
Calcium: 8.3 mg/dL — ABNORMAL LOW (ref 8.9–10.3)
GFR calc Af Amer: >60 mL/min (ref >60)
GFR calc non Af Amer: >60 mL/min (ref >60)
Glucose, Bld: 180 mg/dL — ABNORMAL HIGH (ref 70–99)
POTASSIUM: 4.2 mmol/L (ref 3.5–5.1)
Sodium: 142 mmol/L (ref 135–145)
Total Bilirubin: 0.8 mg/dL (ref 0.3–1.2)
Total Protein: 6.4 g/dL — ABNORMAL LOW (ref 6.5–8.1)

## 2018-01-04 LAB — PHOSPHORUS: PHOSPHORUS: 2.8 mg/dL (ref 2.5–4.6)

## 2018-01-04 LAB — HEMOGLOBIN A1C
HEMOGLOBIN A1C: 11.2 % — AB (ref 4.8–5.6)
Mean Plasma Glucose: 274.74 mg/dL

## 2018-01-04 LAB — TROPONIN I
Troponin I: 0.05 ng/mL (ref <0.03)
Troponin I: 0.04 ng/mL (ref <0.03)

## 2018-01-04 LAB — MAGNESIUM: MAGNESIUM: 2.2 mg/dL (ref 1.7–2.4)

## 2018-01-04 MED ORDER — IPRATROPIUM BROMIDE 0.02 % IN SOLN: 0.5000 mg | RESPIRATORY_TRACT | Status: DC | PRN

## 2018-01-04 MED ORDER — SODIUM CHLORIDE 0.9 % IV BOLUS
1000.0000 mL | Freq: Once | INTRAVENOUS | Status: AC
  Administered 2018-01-04: 1000 mL via INTRAVENOUS

## 2018-01-04 MED ORDER — SODIUM CHLORIDE 0.45 % IV SOLN
INTRAVENOUS | Status: DC
  Filled 2018-01-04 (×2): qty 1000

## 2018-01-04 MED ORDER — LIP MEDEX EX OINT
TOPICAL_OINTMENT | CUTANEOUS | Status: DC | PRN
  Filled 2018-01-04: qty 7

## 2018-01-04 MED ORDER — MOMETASONE FURO-FORMOTEROL FUM 100-5 MCG/ACT IN AERO
2.0000 | INHALATION_SPRAY | Freq: Two times a day (BID) | RESPIRATORY_TRACT | Status: DC
  Administered 2018-01-04 – 2018-01-06 (×5): 2 via RESPIRATORY_TRACT
  Filled 2018-01-04: qty 8.8

## 2018-01-04 MED ORDER — INSULIN DETEMIR 100 UNIT/ML ~~LOC~~ SOLN
15.0000 [IU] | Freq: Every day | SUBCUTANEOUS | Status: DC
  Administered 2018-01-05 – 2018-01-06 (×2): 15 [IU] via SUBCUTANEOUS
  Filled 2018-01-04 (×2): qty 0.15

## 2018-01-04 MED ORDER — INSULIN ASPART 100 UNIT/ML ~~LOC~~ SOLN
0.0000 [IU] | SUBCUTANEOUS | Status: DC
  Administered 2018-01-04 – 2018-01-05 (×4): 8 [IU] via SUBCUTANEOUS
  Administered 2018-01-05: 2 [IU] via SUBCUTANEOUS
  Administered 2018-01-05: 4 [IU] via SUBCUTANEOUS
  Administered 2018-01-05 – 2018-01-06 (×2): 2 [IU] via SUBCUTANEOUS
  Administered 2018-01-06: 4 [IU] via SUBCUTANEOUS
  Administered 2018-01-06: 8 [IU] via SUBCUTANEOUS

## 2018-01-04 MED ORDER — FLUOXETINE HCL 10 MG PO CAPS
10.0000 mg | ORAL_CAPSULE | Freq: Every day | ORAL | Status: DC
  Administered 2018-01-05 – 2018-01-06 (×2): 10 mg via ORAL
  Filled 2018-01-04 (×4): qty 1

## 2018-01-04 MED ORDER — LEVALBUTEROL HCL 0.63 MG/3ML IN NEBU
0.6300 mg | INHALATION_SOLUTION | Freq: Two times a day (BID) | RESPIRATORY_TRACT | Status: DC
  Administered 2018-01-04 – 2018-01-05 (×2): 0.63 mg via RESPIRATORY_TRACT
  Filled 2018-01-04 (×2): qty 3

## 2018-01-04 MED ORDER — IPRATROPIUM BROMIDE 0.02 % IN SOLN
0.5000 mg | Freq: Two times a day (BID) | RESPIRATORY_TRACT | Status: DC
  Administered 2018-01-04 – 2018-01-05 (×2): 0.5 mg via RESPIRATORY_TRACT
  Filled 2018-01-04 (×2): qty 2.5

## 2018-01-04 MED ORDER — FLUCONAZOLE IN SODIUM CHLORIDE 200-0.9 MG/100ML-% IV SOLN: 200.0000 mg | Freq: Once | INTRAVENOUS | Status: DC

## 2018-01-04 MED ORDER — LEVALBUTEROL HCL 0.63 MG/3ML IN NEBU
0.6300 mg | INHALATION_SOLUTION | Freq: Three times a day (TID) | RESPIRATORY_TRACT | Status: DC
  Administered 2018-01-04: 0.63 mg via RESPIRATORY_TRACT
  Filled 2018-01-04: qty 3

## 2018-01-04 MED ORDER — LEVALBUTEROL HCL 0.63 MG/3ML IN NEBU: 0.6300 mg | INHALATION_SOLUTION | RESPIRATORY_TRACT | Status: DC | PRN

## 2018-01-04 MED ORDER — KETOROLAC TROMETHAMINE 30 MG/ML IJ SOLN
30.0000 mg | Freq: Once | INTRAMUSCULAR | Status: AC
  Administered 2018-01-04: 30 mg via INTRAVENOUS
  Filled 2018-01-04: qty 1

## 2018-01-04 MED ORDER — SODIUM CHLORIDE 0.45 % IV SOLN
INTRAVENOUS | Status: DC
Start: 2018-01-04 — End: 2018-01-06
  Administered 2018-01-04 – 2018-01-06 (×3): via INTRAVENOUS

## 2018-01-04 MED ORDER — LISINOPRIL 5 MG PO TABS
5.0000 mg | ORAL_TABLET | Freq: Every day | ORAL | Status: DC
  Administered 2018-01-04 – 2018-01-05 (×2): 5 mg via ORAL
  Filled 2018-01-04 (×2): qty 2

## 2018-01-04 MED ORDER — ACETAMINOPHEN 325 MG PO TABS
650.0000 mg | ORAL_TABLET | Freq: Four times a day (QID) | ORAL | Status: DC | PRN
  Filled 2018-01-04: qty 2

## 2018-01-04 MED ORDER — SODIUM CHLORIDE 0.9 % IV SOLN
INTRAVENOUS | Status: DC
  Administered 2018-01-04: 08:00:00 via INTRAVENOUS

## 2018-01-04 MED ORDER — FLUCONAZOLE 100 MG PO TABS
100.0000 mg | ORAL_TABLET | Freq: Every day | ORAL | Status: DC
  Administered 2018-01-04 – 2018-01-06 (×3): 100 mg via ORAL
  Filled 2018-01-04 (×4): qty 1

## 2018-01-04 MED ORDER — IPRATROPIUM-ALBUTEROL 0.5-2.5 (3) MG/3ML IN SOLN
3.0000 mL | Freq: Three times a day (TID) | RESPIRATORY_TRACT | Status: DC
Start: 2018-01-04 — End: 2018-01-04

## 2018-01-04 MED ORDER — SODIUM CHLORIDE 0.9 % IV SOLN
1.0000 g | INTRAVENOUS | Status: DC
  Administered 2018-01-04 – 2018-01-06 (×3): 1 g via INTRAVENOUS
  Filled 2018-01-04 (×3): qty 1

## 2018-01-04 MED ORDER — INSULIN DETEMIR 100 UNIT/ML ~~LOC~~ SOLN
15.0000 [IU] | Freq: Once | SUBCUTANEOUS | Status: AC
  Administered 2018-01-04: 15 [IU] via SUBCUTANEOUS
  Filled 2018-01-04: qty 0.15

## 2018-01-04 MED ORDER — SODIUM BICARBONATE 650 MG PO TABS
650.0000 mg | ORAL_TABLET | Freq: Two times a day (BID) | ORAL | Status: AC
  Administered 2018-01-04 – 2018-01-05 (×4): 650 mg via ORAL
  Filled 2018-01-04 (×4): qty 1

## 2018-01-04 MED ORDER — TRAMADOL HCL 50 MG PO TABS
50.0000 mg | ORAL_TABLET | Freq: Four times a day (QID) | ORAL | Status: DC | PRN
  Administered 2018-01-04 (×2): 50 mg via ORAL
  Filled 2018-01-04 (×2): qty 1

## 2018-01-04 MED ORDER — IPRATROPIUM BROMIDE 0.02 % IN SOLN
0.5000 mg | Freq: Three times a day (TID) | RESPIRATORY_TRACT | Status: DC
Start: 2018-01-04 — End: 2018-01-04
  Administered 2018-01-04: 0.5 mg via RESPIRATORY_TRACT
  Filled 2018-01-04: qty 2.5

## 2018-01-04 MED ORDER — FLUCONAZOLE 100 MG PO TABS: 100.0000 mg | ORAL_TABLET | Freq: Every day | ORAL | Status: DC

## 2018-01-04 MED ORDER — METOCLOPRAMIDE HCL 5 MG/ML IJ SOLN
5.0000 mg | Freq: Once | INTRAMUSCULAR | Status: AC
  Administered 2018-01-04: 5 mg via INTRAVENOUS
  Filled 2018-01-04: qty 2

## 2018-01-04 NOTE — Consult Note (Signed)
[facility] Face-to-Face Psychiatry Consult   Reason for Consult:  ''Bipolar disorder.'' Referring Physician:  Dr. Grandville Silos Patient Identification: [name] [name] MRN:  [id number] Principal Diagnosis: Adjustment disorder with depressed mood Diagnosis:   Patient Active Problem List   Diagnosis Date Noted  . Adjustment disorder with depressed mood [F43.21] 01/04/2018  . Oral thrush [B37.0]   . Acute lower UTI [N39.0]   . DKA, type 2, not at goal [facility]) [E11.10] 01/03/2018  . Thrush [B37.0] 01/03/2018  . DM (diabetes mellitus), type 2, uncontrolled ([place]) [E11.65] 06/07/2017  . Abscess of groin, right [L02.214] 06/07/2017  . Abscess [L02.91] 06/07/2017  . Bipolar disorder with moderate depression ([place]) [F31.32] 08/31/2015  . Tobacco use disorder [F17.200] 08/28/2015  . HTN (hypertension) [I10] 08/28/2015  . Diabetes ([place]) [E11.9] 08/28/2015  . Hyponatremia [E87.1] 08/28/2015  . DM [E11.9] 10/26/2009  . OBESITY [E66.9] 10/26/2009  . Essential hypertension [I10] 10/26/2009  . DISTURBANCE OF SKIN SENSATION [R20.9] 10/26/2009  . Migraine headache [G43.909] 03/05/2007    Total Time spent with patient: 1 hour  Subjective:   [name] [name] is a 47 y.o. female patient admitted with shortness of breath.  HPI:  Patient with history of type 2 diabetes poorly controlled, obesity, tobacco abuse who was admitted for shortness of breath and rapid breathing severe enough she was placed in ICU. Patient reports history of Bipolar disorder but says she has not seen a psychiatrist or take medications for Bipolar in 8 years. Patient denies any mood lability but says she has been having occasional depressive due to her multiple medical problem. Patient denies psychosis, delusions, SH and HI. She is requesting to be placed on medication for depression.  Past Psychiatric History: as above  Risk to Self:  denies Risk to Others:  denies Prior Inpatient Therapy:  denies Prior Outpatient Therapy:  none  Past  Medical History:  Past Medical History:  Diagnosis Date  . Anxiety disorder   . Asthma   . Bipolar affective disorder ([place])   . Depression   . Diabetes mellitus   . Migraine   . Schizophrenia [facility])     Past Surgical History:  Procedure Laterality Date  . KNEE ARTHROSCOPY     x 2  . KNEE SURGERY Left    Incision made and knee cleaned out  . TUBAL LIGATION     Family History:  Family History  Problem Relation Age of Onset  . Bipolar disorder Mother   . Hypertension Father   . Diabetes Father    Family Psychiatric  History: Social History:  Social History   Substance and Sexual Activity  Alcohol Use Yes   Comment: occasionally     Social History   Substance and Sexual Activity  Drug Use No    Social History   Socioeconomic History  . Marital status: Divorced    Spouse name: Not on file  . Number of children: Not on file  . Years of education: Not on file  . Highest education level: Not on file  Occupational History  . Not on file  Social Needs  . Financial resource strain: Not on file  . Food insecurity:    Worry: Not on file    Inability: Not on file  . Transportation needs:    Medical: Not on file    Non-medical: Not on file  Tobacco Use  . Smoking status: Current Every Day Smoker    Packs/day: 1.00    Years: 29.00    Pack years: 29.00  Types: Cigarettes  . Smokeless tobacco: Never Used  Substance and Sexual Activity  . Alcohol use: Yes    Comment: occasionally  . Drug use: No  . Sexual activity: Yes    Birth control/protection: Surgical  Lifestyle  . Physical activity:    Days per week: Not on file    Minutes per session: Not on file  . Stress: Not on file  Relationships  . Social connections:    Talks on phone: Not on file    Gets together: Not on file    Attends religious service: Not on file    Active member of club or organization: Not on file    Attends meetings of clubs or organizations: Not on file    Relationship status: Not  on file  Other Topics Concern  . Not on file  Social History Narrative   Patient lives in [place] with her 2 kids- 58 in 85 years old.   She has total 4 kids.   She is a single mom.         Additional Social History:    Allergies:   Allergies  Allergen Reactions  . Tramadol Nausea Only    "feeling like I'm on fire"  . Nicotine Rash    Patient states she is allergic to the Nicotine patch and nicotine gum and they cause her to break out in a rash.  She states she can smoke cigarettes.    Labs:  Results for orders placed or performed during the hospital encounter of 01/03/18 (from the past 48 hour(s))  CBC with Differential     Status: Abnormal   Collection Time: 01/03/18 12:45 PM  Result Value Ref Range   WBC 18.9 (H) 4.0 - 10.5 K/uL   RBC 5.18 (H) 3.87 - 5.11 MIL/uL   Hemoglobin 15.3 (H) 12.0 - 15.0 g/dL   HCT 45.2 36.0 - 46.0 %   MCV 87.3 78.0 - 100.0 fL   MCH 29.5 26.0 - 34.0 pg   MCHC 33.8 30.0 - 36.0 g/dL   RDW 14.5 11.5 - 15.5 %   Platelets 281 150 - 400 K/uL   Neutrophils Relative % 84 %   Neutro Abs 15.8 (H) 1.7 - 7.7 K/uL   Lymphocytes Relative 9 %   Lymphs Abs 1.8 0.7 - 4.0 K/uL   Monocytes Relative 7 %   Monocytes Absolute 1.3 (H) 0.1 - 1.0 K/uL   Eosinophils Relative 0 %   Eosinophils Absolute 0.0 0.0 - 0.7 K/uL   Basophils Relative 0 %   Basophils Absolute 0.0 0.0 - 0.1 K/uL    Comment: Performed at [facility], [place] [address], [place], [place] [address]  Blood gas, venous     Status: Abnormal (Preliminary result)   Collection Time: 01/03/18  2:35 PM  Result Value Ref Range   pH, Ven 7.225 (L) 7.250 - 7.430   pCO2, Ven 21.0 (L) 44.0 - 60.0 mmHg   pO2, Ven 50.4 (H) 32.0 - 45.0 mmHg   Bicarbonate 8.4 (L) 20.0 - 28.0 mmol/L   O2 Saturation 80.4 %   Patient temperature 37.0    Collection site VENOUS    Drawn by DRAWN BY RN    Sample type VENOUS     Comment: Performed at [facility], [place] [address]  [place], [place] [address]   Mechanical Rate PENDING   I-stat Chem 8, ED     Status: Abnormal   Collection Time: 01/03/18  2:49 PM  Result  Value Ref Range   Sodium 137 135 - 145 mmol/L   Potassium 4.7 3.5 - 5.1 mmol/L   Chloride 113 (H) 98 - 111 mmol/L   BUN 42 (H) 6 - 20 mg/dL   Creatinine, Ser 1.00 0.44 - 1.00 mg/dL   Glucose, Bld 489 (H) 70 - 99 mg/dL   Calcium, Ion 1.20 1.15 - 1.40 mmol/L   TCO2 9 (L) 22 - 32 mmol/L   Hemoglobin 16.3 (H) 12.0 - 15.0 g/dL   HCT 48.0 (H) 36.0 - 46.0 %  Urinalysis, Routine w reflex microscopic     Status: Abnormal   Collection Time: 01/03/18  5:20 PM  Result Value Ref Range   Color, Urine RED (A) YELLOW    Comment: BIOCHEMICALS MAY BE AFFECTED BY COLOR   APPearance HAZY (A) CLEAR   Specific Gravity, Urine 1.028 1.005 - 1.030   pH 6.0 5.0 - 8.0   Glucose, UA >=500 (A) NEGATIVE mg/dL   Hgb urine dipstick LARGE (A) NEGATIVE   Bilirubin Urine NEGATIVE NEGATIVE   Ketones, ur 20 (A) NEGATIVE mg/dL   Protein, ur 30 (A) NEGATIVE mg/dL   Nitrite NEGATIVE NEGATIVE   Leukocytes, UA MODERATE (A) NEGATIVE   RBC / HPF >50 (H) 0 - 5 RBC/hpf   WBC, UA 11-20 0 - 5 WBC/hpf   Bacteria, UA RARE (A) NONE SEEN   Squamous Epithelial / LPF 0-5 0 - 5   Budding Yeast PRESENT     Comment: Performed at [facility], [place] [address] [place], [place] [address]  I-Stat beta hCG blood, ED     Status: None   Collection Time: 01/03/18  6:25 PM  Result Value Ref Range   I-stat hCG, quantitative <5.0 <5 mIU/mL   Comment 3            Comment:   GEST. AGE      CONC.  (mIU/mL)   <=1 WEEK        5 - 50     2 WEEKS       50 - 500     3 WEEKS       100 - 10,000     4 WEEKS     1,000 - 30,000        FEMALE AND NON-PREGNANT FEMALE:     LESS THAN 5 mIU/mL   CBG monitoring, ED     Status: Abnormal   Collection Time: 01/03/18  6:26 PM  Result Value Ref Range   Glucose-Capillary 487 (H) 70 - 99 mg/dL  POC CBG, ED     Status: Abnormal   Collection Time: 01/03/18   7:45 PM  Result Value Ref Range   Glucose-Capillary 401 (H) 70 - 99 mg/dL  CBG monitoring, ED     Status: Abnormal   Collection Time: 01/03/18  8:51 PM  Result Value Ref Range   Glucose-Capillary 364 (H) 70 - 99 mg/dL  MRSA PCR Screening     Status: None   Collection Time: 01/03/18  9:08 PM  Result Value Ref Range   MRSA by PCR NEGATIVE NEGATIVE    Comment:        The GeneXpert MRSA Assay (FDA approved for NASAL specimens only), is one component of a comprehensive MRSA colonization surveillance program. It is not intended to diagnose MRSA infection nor to guide or monitor treatment for MRSA infections. Performed at [facility], [place] [address], [place], [place] [address]   Glucose, capillary  Status: Abnormal   Collection Time: 01/03/18 10:17 PM  Result Value Ref Range   Glucose-Capillary 261 (H) 70 - 99 mg/dL  Magnesium     Status: None   Collection Time: 01/03/18 10:18 PM  Result Value Ref Range   Magnesium 2.1 1.7 - 2.4 mg/dL    Comment: Performed at [facility], [place] [address] [place], [place] [address]  Phosphorus     Status: Abnormal   Collection Time: 01/03/18 10:18 PM  Result Value Ref Range   Phosphorus 1.8 (L) 2.5 - 4.6 mg/dL    Comment: Performed at [facility], [place] [address], [place], [place] [address]  Beta-hydroxybutyric acid     Status: Abnormal   Collection Time: 01/03/18 10:18 PM  Result Value Ref Range   Beta-Hydroxybutyric Acid 3.20 (H) 0.05 - 0.27 mmol/L    Comment: Performed at [facility], [place] [address] [place], [place] [address]  Lactic acid, plasma     Status: None   Collection Time: 01/03/18 10:18 PM  Result Value Ref Range   Lactic Acid, Venous 1.0 0.5 - 1.9 mmol/L    Comment: Performed at [facility], [place] [address], [place], [place] [address]  Troponin I (q 6hr x 3)     Status: Abnormal   Collection Time: 01/03/18 10:18 PM  Result Value  Ref Range   Troponin I 0.05 (HH) <0.03 ng/mL    Comment: SYQUIA,I AT 2306 ON 01/03/2018 BY MOSLEY,J Performed at [facility], [place] [address], [place], [place] [address]   Basic metabolic panel     Status: Abnormal   Collection Time: 01/03/18 10:18 PM  Result Value Ref Range   Sodium 138 135 - 145 mmol/L   Potassium 4.3 3.5 - 5.1 mmol/L   Chloride 116 (H) 98 - 111 mmol/L   CO2 12 (L) 22 - 32 mmol/L   Glucose, Bld 299 (H) 70 - 99 mg/dL   BUN 32 (H) 6 - 20 mg/dL   Creatinine, Ser 1.13 (H) 0.44 - 1.00 mg/dL   Calcium 8.4 (L) 8.9 - 10.3 mg/dL   GFR calc non Af Amer 57 (L) >60 mL/min   GFR calc Af Amer >60 >60 mL/min    Comment: (NOTE) The eGFR has been calculated using the CKD EPI equation. This calculation has not been validated in all clinical situations. eGFR's persistently <60 mL/min signify possible Chronic Kidney Disease.    Anion gap 10 5 - 15    Comment: Performed at [facility], [place] [address], [place], [place] [address]  Glucose, capillary     Status: Abnormal   Collection Time: 01/03/18 11:18 PM  Result Value Ref Range   Glucose-Capillary 247 (H) 70 - 99 mg/dL  Glucose, capillary     Status: Abnormal   Collection Time: 01/04/18 12:24 AM  Result Value Ref Range   Glucose-Capillary 224 (H) 70 - 99 mg/dL  Troponin I (q 6hr x 3)     Status: Abnormal   Collection Time: 01/04/18  1:18 AM  Result Value Ref Range   Troponin I 0.05 (HH) <0.03 ng/mL    Comment: CRITICAL VALUE NOTED.  VALUE IS CONSISTENT WITH PREVIOUSLY REPORTED AND CALLED VALUE. Performed at [facility], [place] [address] [place], [place] [address]   Comprehensive metabolic panel     Status: Abnormal   Collection Time: 01/04/18  1:18 AM  Result Value Ref Range   Sodium 142 135 - 145 mmol/L  Potassium 4.2 3.5 - 5.1 mmol/L   Chloride 119 (H) 98 - 111 mmol/L   CO2 16 (L) 22 - 32 mmol/L   Glucose, Bld 180 (H) 70 - 99 mg/dL   BUN 33 (H) 6 - 20 mg/dL    Creatinine, Ser 0.92 0.44 - 1.00 mg/dL   Calcium 8.3 (L) 8.9 - 10.3 mg/dL   Total Protein 6.4 (L) 6.5 - 8.1 g/dL   Albumin 3.3 (L) 3.5 - 5.0 g/dL   AST 12 (L) 15 - 41 U/L   ALT 20 0 - 44 U/L   Alkaline Phosphatase 83 38 - 126 U/L   Total Bilirubin 0.8 0.3 - 1.2 mg/dL   GFR calc non Af Amer >60 >60 mL/min   GFR calc Af Amer >60 >60 mL/min    Comment: (NOTE) The eGFR has been calculated using the CKD EPI equation. This calculation has not been validated in all clinical situations. eGFR's persistently <60 mL/min signify possible Chronic Kidney Disease.    Anion gap 7 5 - 15    Comment: Performed at [facility], [place] [address], [place], [place] [address]  Hemoglobin A1c     Status: Abnormal   Collection Time: 01/04/18  1:18 AM  Result Value Ref Range   Hgb A1c MFr Bld 11.2 (H) 4.8 - 5.6 %    Comment: (NOTE) Pre diabetes:          5.7%-6.4% Diabetes:              >6.4% Glycemic control for   <7.0% adults with diabetes    Mean Plasma Glucose 274.74 mg/dL    Comment: Performed at [place] [address] [place], [place] [address]  Glucose, capillary     Status: Abnormal   Collection Time: 01/04/18  1:58 AM  Result Value Ref Range   Glucose-Capillary 155 (H) 70 - 99 mg/dL  Glucose, capillary     Status: Abnormal   Collection Time: 01/04/18  3:03 AM  Result Value Ref Range   Glucose-Capillary 142 (H) 70 - 99 mg/dL  Basic metabolic panel     Status: Abnormal   Collection Time: 01/04/18  3:21 AM  Result Value Ref Range   Sodium 142 135 - 145 mmol/L   Potassium 4.5 3.5 - 5.1 mmol/L   Chloride 119 (H) 98 - 111 mmol/L   CO2 15 (L) 22 - 32 mmol/L   Glucose, Bld 131 (H) 70 - 99 mg/dL   BUN 32 (H) 6 - 20 mg/dL   Creatinine, Ser 0.98 0.44 - 1.00 mg/dL   Calcium 8.4 (L) 8.9 - 10.3 mg/dL   GFR calc non Af Amer >60 >60 mL/min   GFR calc Af Amer >60 >60 mL/min    Comment: (NOTE) The eGFR has been calculated using the CKD EPI equation. This calculation  has not been validated in all clinical situations. eGFR's persistently <60 mL/min signify possible Chronic Kidney Disease.    Anion gap 8 5 - 15    Comment: Performed at [facility], [place] [address], [place], [place] [address]  Glucose, capillary     Status: Abnormal   Collection Time: 01/04/18  4:06 AM  Result Value Ref Range   Glucose-Capillary 122 (H) 70 - 99 mg/dL   Comment 1 Notify RN    Comment 2 Document in Chart   Glucose, capillary     Status: Abnormal   Collection Time: 01/04/18  5:11 AM  Result Value Ref Range  Glucose-Capillary 121 (H) 70 - 99 mg/dL   Comment 1 Notify RN    Comment 2 Document in Chart   Glucose, capillary     Status: Abnormal   Collection Time: 01/04/18  6:21 AM  Result Value Ref Range   Glucose-Capillary 139 (H) 70 - 99 mg/dL  Troponin I (q 6hr x 3)     Status: Abnormal   Collection Time: 01/04/18  8:25 AM  Result Value Ref Range   Troponin I 0.04 (HH) <0.03 ng/mL    Comment: CRITICAL VALUE NOTED.  VALUE IS CONSISTENT WITH PREVIOUSLY REPORTED AND CALLED VALUE. Performed at [facility], [place] [address], [place], [place] [address]   Basic metabolic panel     Status: Abnormal   Collection Time: 01/04/18  8:25 AM  Result Value Ref Range   Sodium 144 135 - 145 mmol/L   Potassium 4.4 3.5 - 5.1 mmol/L   Chloride 119 (H) 98 - 111 mmol/L   CO2 18 (L) 22 - 32 mmol/L   Glucose, Bld 156 (H) 70 - 99 mg/dL   BUN 33 (H) 6 - 20 mg/dL   Creatinine, Ser 0.98 0.44 - 1.00 mg/dL   Calcium 8.7 (L) 8.9 - 10.3 mg/dL   GFR calc non Af Amer >60 >60 mL/min   GFR calc Af Amer >60 >60 mL/min    Comment: (NOTE) The eGFR has been calculated using the CKD EPI equation. This calculation has not been validated in all clinical situations. eGFR's persistently <60 mL/min signify possible Chronic Kidney Disease.    Anion gap 7 5 - 15    Comment: Performed at [facility], [place] [address] [place], [place] [address]   CBC with Differential/Platelet     Status: Abnormal   Collection Time: 01/04/18  8:25 AM  Result Value Ref Range   WBC 13.4 (H) 4.0 - 10.5 K/uL   RBC 4.58 3.87 - 5.11 MIL/uL   Hemoglobin 13.3 12.0 - 15.0 g/dL   HCT 39.7 36.0 - 46.0 %   MCV 86.7 78.0 - 100.0 fL   MCH 29.0 26.0 - 34.0 pg   MCHC 33.5 30.0 - 36.0 g/dL   RDW 14.6 11.5 - 15.5 %   Platelets 247 150 - 400 K/uL   Neutrophils Relative % 88 %   Neutro Abs 11.7 (H) 1.7 - 7.7 K/uL   Lymphocytes Relative 6 %   Lymphs Abs 0.8 0.7 - 4.0 K/uL   Monocytes Relative 6 %   Monocytes Absolute 0.8 0.1 - 1.0 K/uL   Eosinophils Relative 0 %   Eosinophils Absolute 0.0 0.0 - 0.7 K/uL   Basophils Relative 0 %   Basophils Absolute 0.0 0.0 - 0.1 K/uL    Comment: Performed at [facility], [place] [address] [place], [place] [address]  Magnesium     Status: None   Collection Time: 01/04/18  8:25 AM  Result Value Ref Range   Magnesium 2.2 1.7 - 2.4 mg/dL    Comment: Performed at [facility], [place] [address] [place], [place] [address]  Phosphorus     Status: None   Collection Time: 01/04/18  8:25 AM  Result Value Ref Range   Phosphorus 2.8 2.5 - 4.6 mg/dL    Comment: Performed at [facility], [place] [address], [place], [place] [address]  Glucose, capillary     Status: Abnormal   Collection Time: 01/04/18  9:32 AM  Result Value Ref Range   Glucose-Capillary 139 (  H) 70 - 99 mg/dL   Comment 1 Notify RN    Comment 2 Document in Chart   Basic metabolic panel     Status: Abnormal   Collection Time: 01/04/18 11:40 AM  Result Value Ref Range   Sodium 145 135 - 145 mmol/L   Potassium 4.2 3.5 - 5.1 mmol/L   Chloride 118 (H) 98 - 111 mmol/L   CO2 17 (L) 22 - 32 mmol/L   Glucose, Bld 207 (H) 70 - 99 mg/dL   BUN 32 (H) 6 - 20 mg/dL   Creatinine, Ser 0.83 0.44 - 1.00 mg/dL   Calcium 8.4 (L) 8.9 - 10.3 mg/dL   GFR calc non Af Amer >60 >60 mL/min   GFR calc Af Amer >60 >60 mL/min    Comment:  (NOTE) The eGFR has been calculated using the CKD EPI equation. This calculation has not been validated in all clinical situations. eGFR's persistently <60 mL/min signify possible Chronic Kidney Disease.    Anion gap 10 5 - 15    Comment: Performed at [facility], [place] [address], [place], [place] [address]    Current Facility-Administered Medications  Medication Dose Route Frequency Provider Last Rate Last Dose  . 0.9 %  sodium chloride infusion   Intravenous Continuous Eugenie Filler, MD 125 mL/hr at 01/04/18 [id number]    . acetaminophen (TYLENOL) tablet 650 mg  650 mg Oral Q6H PRN Blount, Scarlette Shorts T, NP      . cefTRIAXone (ROCEPHIN) 1 g in sodium chloride 0.9 % 100 mL IVPB  1 g Intravenous Q24H Eugenie Filler, MD   Stopped at 01/04/18 1254  . chlorhexidine (PERIDEX) 0.12 % solution 15 mL  15 mL Mouth Rinse BID Doutova, Anastassia, MD      . enoxaparin (LOVENOX) injection 40 mg  40 mg Subcutaneous QHS Doutova, Anastassia, MD      . fluconazole (DIFLUCAN) tablet 100 mg  100 mg Oral Daily Eugenie Filler, MD   100 mg at 01/04/18 0955  . insulin aspart (novoLOG) injection 0-24 Units  0-24 Units Subcutaneous Q4H Blount, Lolita Cram, NP      . Derrill Memo ON 01/05/2018] insulin detemir (LEVEMIR) injection 15 Units  15 Units Subcutaneous Daily Blount, Xenia T, NP      . ipratropium (ATROVENT) nebulizer solution 0.5 mg  0.5 mg Nebulization TID Eugenie Filler, MD   0.5 mg at 01/04/18 1258  . ipratropium (ATROVENT) nebulizer solution 0.5 mg  0.5 mg Nebulization Q2H PRN Eugenie Filler, MD      . levalbuterol Penne Lash) nebulizer solution 0.63 mg  0.63 mg Nebulization TID Eugenie Filler, MD   0.63 mg at 01/04/18 1258  . levalbuterol (XOPENEX) nebulizer solution 0.63 mg  0.63 mg Nebulization Q2H PRN Eugenie Filler, MD      . lisinopril (PRINIVIL,ZESTRIL) tablet 5 mg  5 mg Oral Daily Eugenie Filler, MD   5 mg at 01/04/18 0955  . MEDLINE mouth rinse  15 mL Mouth Rinse  [id number] Doutova, Anastassia, MD      . mometasone-formoterol (DULERA) 100-5 MCG/ACT inhaler 2 puff  2 puff Inhalation BID Eugenie Filler, MD      . nystatin (MYCOSTATIN) 100000 UNIT/ML suspension 500,000 Units  5 mL Mouth/Throat QID Toy Baker, MD   500,000 Units at 01/04/18 0955  . ondansetron (ZOFRAN) injection 4 mg  4 mg Intravenous Q6H PRN Lovey Newcomer T, NP   4 mg at 01/04/18 0819  . sodium bicarbonate tablet  650 mg  650 mg Oral BID Eugenie Filler, MD   650 mg at 01/04/18 0955  . traMADol (ULTRAM) tablet 50 mg  50 mg Oral Q6H PRN Eugenie Filler, MD   50 mg at 01/04/18 9167    Musculoskeletal: Strength & Muscle Tone: within normal limits Gait & Station: normal Patient leans: N/A  Psychiatric Specialty Exam: Physical Exam  Psychiatric: Her speech is normal. Judgment and thought content normal. She is slowed and withdrawn. Cognition and memory are normal. She exhibits a depressed mood.    Review of Systems  Constitutional: Positive for malaise/fatigue.  HENT: Negative.   Eyes: Negative.   Respiratory: Negative.   Cardiovascular: Negative.   Gastrointestinal: Negative.   Genitourinary: Negative.   Musculoskeletal: Negative.   Skin: Negative.   Neurological: Negative.   Endo/Heme/Allergies: Negative.   Psychiatric/Behavioral: Positive for depression.    Blood pressure (!) 163/91, pulse (!) 109, temperature 98.6 F (37 C), temperature source Oral, resp. rate 19, height _0  (1.651 m), weight 85.9 kg (189 lb 6 oz), SpO2 98 %.Body mass index is 31.51 kg/m.  General Appearance: Casual  Eye Contact:  Good  Speech:  Clear and Coherent  Volume:  Decreased  Mood:  Dysphoric  Affect:  Constricted  Thought Process:  Coherent and Linear  Orientation:  Full (Time, Place, and Person)  Thought Content:  Logical  Suicidal Thoughts:  No  Homicidal Thoughts:  No  Memory:  Immediate;   Good Recent;   Good Remote;   Good  Judgement:  Intact  Insight:  Fair   Psychomotor Activity:  Normal  Concentration:  Concentration: Good and Attention Span: Good  Recall:  Good  Fund of Knowledge:  Good  Language:  Good  Akathisia:  No  Handed:  Right  AIMS (if indicated):     Assets:  Communication Skills Desire for Improvement Social Support  ADL's:  Intact  Cognition:  WNL  Sleep:   fair     Treatment Plan Summary: Patient is a 47 year old female history of type 2 diabetes poorly controlled, obesity, tobacco abuse and bipolar depression who was admitted due to shortness of breath and rapid breathing.Patient now reports occasional depressive symptoms due to her multiple medical issues.  Plan/Recommendation: -Add Prozac 10 mg daily for depression. -Unit social worker to refer patient to [facility] for psychiatric medication management upon discharge. -Psychiatric signing out at this time.     Disposition: No evidence of imminent risk to self or others at present.   Patient does not meet criteria for psychiatric inpatient admission. Supportive therapy provided about ongoing stressors. Re-consult psych service as needed  Corena Pilgrim, MD 01/04/2018 1:02 PM

## 2018-01-04 NOTE — Progress Notes (Signed)
PROGRESS NOTE    [name] [name]  RSW:[id number] DOB: August 15, 1970 DOA: 01/03/2018 PCP: System, Pcp Not In    Brief Narrative, Patient is a 47 year old female history of type 2 diabetes poorly controlled, obesity, tobacco abuse, bipolar disorder presented with shortness of breath and rapid breathing.  Patient initially stated was deep cleaning with bleach 4 days prior to admission evaluated by EMS given 125 of Solu-Medrol and duo nebs on route to the hospital for presumed respiratory distress.  No cough, no fever, no nausea, no wheezing noted.  Patient noted to be in rapid breathing.  Patient seen in the ED was given some breathing treatments chest x-ray was clear.  CT angiogram chest was negative for PE.  Patient noted to be in DKA placed on the glucose stabilizer and admitted to the stepdown unit.   Assessment & Plan:   Principal Problem:   DKA, type 2, not at goal [facility]) Active Problems:   Migraine headache   Essential hypertension   Tobacco use disorder   Bipolar disorder with moderate depression ([place])   DM (diabetes mellitus), type 2, uncontrolled ([place])   Thrush   Oral thrush   Acute lower UTI  #1 DKA and type II diabetic/poorly controlled diabetes mellitus--hemoglobin A1c 11.2 01/04/2018 Likely secondary to medical noncompliance and probable UTI.  Patient states had run out of her medication since March 2019 and due to insurance/DSS issues has been unable to get her medications.  Patient also states does not have a PCP and unable to get refills.  Chest x-ray CT chest negative for any acute pulmonary abnormalities.  Urinalysis was moderate leukocytes, nitrite negative, 11-20 WBC.  Patient currently afebrile.  Patient with no chest pain.  EKG with a sinus tachycardia with no ischemic changes noted.  Patient on admission was noted to have a blood glucose of 489, bicarb of 9, anion gap of 15, beta hydroxybutyric acid was elevated at 3.20.  Urinalysis with greater than 500 glucose, 20 ketones,  30 protein.  Budding yeast noted on urinalysis.  Patient was on the glucose stabilizer however this was discontinued earlier this morning and patient was transitioned to Levemir 15 units daily.  Stat be met obtained this morning shows improvement with bicarb currently at 18.  Anion gap of 7.  We will give 1 L bolus of normal saline and placed on IV fluids of normal saline at 125 cc/h.  Repeat BMET in 4 hours.  Bicarb tablets twice daily x2 days.  Start IV Rocephin empirically for probable UTI.  Supportive care.  Diabetes coordinator following.  2.  Hypertension Patient was supposed to be on lisinopril 5 mg daily which we will resume at this time.  Patient has been having issues getting her medications.  3.  Oral thrush/??  Candiduria Likely due to poorly controlled diabetes.  Continue nystatin swish and swallow.  Will place on Diflucan as patient initially complained of some vaginal itching and concerns for vaginal candidiasis however has retracted her statements and denies any vaginal itching.  Budding yeast noted on urinalysis.  Check HIV.  4.  Tobacco abuse Tobacco cessation.  Patient states had a bad reaction to nicotine patch a few years ago and as such does not want it at this time.  Patient interested in tobacco cessation.  Place on [place], scheduled Xopenex and Atrovent nebs.  5.  Bipolar disorder/moderate depression Patient has been out of medications for several months prior to admission.  Patient with a flat affect and what seems like a  depressed mood.  Consult with psychiatry for medication management and further evaluation and recommendations.  6.  History of asthma Placed on scheduled Xopenex nebs.  7.  Migraine headache Reglan 5 mg IV x1.  Ultram 50 mg as needed first dose now.  #8 probable UTI Urinalysis with moderate leukocytes, nitrite negative, 11-20 WBCs, greater than 50 RBCs.  Patient presenting in DKA.  Check a urine culture.  Will place empirically on IV Rocephin pending  urine cultures.   DVT prophylaxis: Lovenox Code Status: DNR.  Discussed with patient again. Family Communication: Updated patient.  No family at bedside. Disposition Plan: Remain in stepdown unit today.  Disposition pending psychiatric evaluation.   Consultants:   Psychiatry pending  Procedures:   CT angiogram chest 01/03/2018  Chest x-ray 01/03/2018  Antimicrobials: IV Rocephin 01/04/2018   Subjective: Laying in bed.  Almost tearful.  Seems sad.  Asking when she can eat.  States due to insurance issues/insurance card had been out of her insulin since March 2019.  Complaining of a headache.  Denies any chest pain.  No shortness of breath.  No abdominal pain.  Initially complained of vaginal itching and when asked again denied any vaginal itching.  Currently on her menses.  Patient denies any shortness of breath and feels breathing issues she presented with have resolved.  Objective: Vitals:   01/04/18 0600 01/04/18 0625 01/04/18 0731 01/04/18 0800  BP:   (!) 166/90   Pulse: 100 98 95   Resp: (!) 24 (!) 21 19   Temp:    98.9 F (37.2 C)  TempSrc:    Oral  SpO2: 94% 99% 98%   Weight:      Height:        Intake/Output Summary (Last 24 hours) at 01/04/2018 1019 Last data filed at 01/04/2018 0900 Gross per 24 hour  Intake 3236.91 ml  Output -  Net 3236.91 ml   Filed Weights   01/03/18 1838 01/03/18 2100  Weight: 92.3 kg (203 lb 8 oz) 85.9 kg (189 lb 6 oz)    Examination:  General exam: Appears calm and comfortable.  Oral thrush Respiratory system: Minimal expiratory wheezing.  No rhonchi.  No crackles.  Respiratory effort normal. Cardiovascular system: S1 & S2 heard, RRR. No JVD, murmurs, rubs, gallops or clicks. No pedal edema. Gastrointestinal system: Abdomen is nondistended, soft and nontender. No organomegaly or masses felt. Normal bowel sounds heard. Central nervous system: Alert and oriented. No focal neurological deficits. Extremities: Symmetric 5 x 5  power. Skin: No rashes, lesions or ulcers Psychiatry: Judgement and insight appear poor to fair. Mood depressed & affect flat.     Data Reviewed: I have personally reviewed following labs and imaging studies  CBC: Recent Labs  Lab 01/03/18 1245 01/03/18 1449 01/04/18 0825  WBC 18.9*  --  13.4*  NEUTROABS 15.8*  --  11.7*  HGB 15.3* 16.3* 13.3  HCT 45.2 48.0* 39.7  MCV 87.3  --  86.7  PLT 281  --  119   Basic Metabolic Panel: Recent Labs  Lab 01/03/18 1449 01/03/18 2218 01/04/18 0118 01/04/18 0321 01/04/18 0825  NA 137 138 142 142 144  K 4.7 4.3 4.2 4.5 4.4  CL 113* 116* 119* 119* 119*  CO2  --  12* 16* 15* 18*  GLUCOSE 489* 299* 180* 131* 156*  BUN 42* 32* 33* 32* 33*  CREATININE 1.00 1.13* 0.92 0.98 0.98  CALCIUM  --  8.4* 8.3* 8.4* 8.7*  MG  --  2.1  --   --  2.2  PHOS  --  1.8*  --   --  2.8   GFR: Estimated Creatinine Clearance: 76 mL/min (by C-G formula based on SCr of 0.98 mg/dL). Liver Function Tests: Recent Labs  Lab 01/04/18 0118  AST 12*  ALT 20  ALKPHOS 83  BILITOT 0.8  PROT 6.4*  ALBUMIN 3.3*   No results for input(s): LIPASE, AMYLASE in the last 168 hours. No results for input(s): AMMONIA in the last 168 hours. Coagulation Profile: No results for input(s): INR, PROTIME in the last 168 hours. Cardiac Enzymes: Recent Labs  Lab 01/03/18 2218 01/04/18 0118 01/04/18 0825  TROPONINI 0.05* 0.05* 0.04*   BNP (last 3 results) No results for input(s): PROBNP in the last 8760 hours. HbA1C: Recent Labs    01/04/18 0118  HGBA1C 11.2*   CBG: Recent Labs  Lab 01/04/18 0303 01/04/18 0406 01/04/18 0511 01/04/18 0621 01/04/18 0932  GLUCAP 142* 122* 121* 139* 139*   Lipid Profile: No results for input(s): CHOL, HDL, LDLCALC, TRIG, CHOLHDL, LDLDIRECT in the last 72 hours. Thyroid Function Tests: No results for input(s): TSH, T4TOTAL, FREET4, T3FREE, THYROIDAB in the last 72 hours. Anemia Panel: No results for input(s): VITAMINB12,  FOLATE, FERRITIN, TIBC, IRON, RETICCTPCT in the last 72 hours. Sepsis Labs: Recent Labs  Lab 01/03/18 2218  LATICACIDVEN 1.0    Recent Results (from the past 240 hour(s))  MRSA PCR Screening     Status: None   Collection Time: 01/03/18  9:08 PM  Result Value Ref Range Status   MRSA by PCR NEGATIVE NEGATIVE Final    Comment:        The GeneXpert MRSA Assay (FDA approved for NASAL specimens only), is one component of a comprehensive MRSA colonization surveillance program. It is not intended to diagnose MRSA infection nor to guide or monitor treatment for MRSA infections. Performed at [facility], [place] [address], [place], [place] [address]          Radiology Studies: Dg Chest 2 View  Result Date: 01/03/2018 CLINICAL DATA:  Shortness of breath. EXAM: CHEST - 2 VIEW COMPARISON:  01/19/2016 FINDINGS: Cardiomediastinal silhouette is normal. Mediastinal contours appear intact. There is no evidence of focal airspace consolidation or pneumothorax. Elevation of the right hemidiaphragm. Osseous structures are without acute abnormality. Soft tissues are grossly normal. IMPRESSION: Elevation of the right hemidiaphragm may represent eventration of the diaphragm or less likely subpulmonic pleural effusion. Confirmation with decubital radiograph, right side down, may be considered if found clinically indicated. Otherwise no evidence airspace consolidation. Electronically Signed   By: Fidela Salisbury M.D.   On: 01/03/2018 13:17   Ct Angio Chest Pe W/cm &/or Wo Cm  Result Date: 01/03/2018 CLINICAL DATA:  Shortness of breath for several days, possible inhalation injury EXAM: CT ANGIOGRAPHY CHEST WITH CONTRAST TECHNIQUE: Multidetector CT imaging of the chest was performed using the standard protocol during bolus administration of intravenous contrast. Multiplanar CT image reconstructions and MIPs were obtained to evaluate the vascular anatomy. The examination is somewhat  limited by patient motion artifact. CONTRAST:  169m ISOVUE-370 IOPAMIDOL (ISOVUE-370) INJECTION 76% COMPARISON:  None. FINDINGS: Cardiovascular: Thoracic aorta shows a normal branching pattern. No aneurysmal dilatation or dissection is seen. No significant cardiac enlargement is noted. Mild coronary calcifications are seen. The pulmonary artery shows a normal branching pattern without evidence of filling defect to suggest pulmonary embolism. Mediastinum/Nodes: Thoracic inlet is within normal limits. No sizable hilar or mediastinal adenopathy is noted. The esophagus is within normal limits. Lungs/Pleura:  Lungs are well aerated bilaterally. No focal infiltrate or sizable effusion is seen. No sizable parenchymal nodule is noted. Upper Abdomen: Fatty infiltration of the liver is noted. The remainder of the upper abdomen is within normal limits. Musculoskeletal: Degenerative changes of the thoracic spine are noted. Review of the MIP images confirms the above findings. IMPRESSION: No evidence of pulmonary emboli. No acute abnormality is noted. Electronically Signed   By: Inez Catalina M.D.   On: 01/03/2018 15:27        Scheduled Meds: . chlorhexidine  15 mL Mouth Rinse BID  . enoxaparin (LOVENOX) injection  40 mg Subcutaneous QHS  . fluconazole  100 mg Oral Daily  . insulin aspart  0-24 Units Subcutaneous Q4H  . [START ON 01/05/2018] insulin detemir  15 Units Subcutaneous Daily  . ipratropium  0.5 mg Nebulization TID  . levalbuterol  0.63 mg Nebulization TID  . lisinopril  5 mg Oral Daily  . mouth rinse  15 mL Mouth Rinse q12n4p  . mometasone-formoterol  2 puff Inhalation BID  . nystatin  5 mL Mouth/Throat QID  . sodium bicarbonate  650 mg Oral BID   Continuous Infusions: . sodium chloride 125 mL/hr at 01/04/18 0811  . cefTRIAXone (ROCEPHIN)  IV       LOS: 0 days    Time spent: 40 minutes    Irine Seal, MD [facility] Pager [phone]  If 7PM-7AM, please contact  night-coverage [email or web address] Password [facility] 01/04/2018, 10:19 AM

## 2018-01-04 NOTE — Progress Notes (Signed)
Inpatient Diabetes Program Recommendations  AACE/ADA: New Consensus Statement on Inpatient Glycemic Control (2015)  Target Ranges:  Prepandial:   less than 140 mg/dL      Peak postprandial:   less than 180 mg/dL (1-2 hours)      Critically ill patients:  140 - 180 mg/dL   Lab Results  Component Value Date   GLUCAP 139 (H) 01/04/2018   HGBA1C 12.9 (H) 06/07/2017    Review of Glycemic Control  Diabetes history: DM 2 Outpatient Diabetes medications: 70/30 15 units BID, Metformin 850 mg BID (unclear if she was taking) Current orders for Inpatient glycemic control: IV insulin to transition today  Inpatient Diabetes Program Recommendations:    Last CO2 was 15. Acidosis was not cleared at that time and patient not appropriate for transition. BMET ordered for this am.   When appropriate for transition consider Lantus 18 units Q24 hours. Novolog 0-9 units tid + Novolog 0-5 units qhs.  Patient visit on 06/10/2017 by DM coordinator, patient was taught how to use insulin due to A1c 12.9%. Patient was given Medicaid number at that time that we had on file since she misplaced her card.  Insulin 70/30 $25/vial at [facility]  Metformin $4  Will touch base with patient this admission.  Thanks, Christena Deem RN, MSN, BC-ADM Inpatient Diabetes Coordinator Team Pager [phone] (8a-5p)

## 2018-01-05 DIAGNOSIS — E870 Hyperosmolality and hypernatremia: Secondary | ICD-10-CM

## 2018-01-05 DIAGNOSIS — B3749 Other urogenital candidiasis: Secondary | ICD-10-CM

## 2018-01-05 DIAGNOSIS — E1310 Other specified diabetes mellitus with ketoacidosis without coma: Secondary | ICD-10-CM

## 2018-01-05 DIAGNOSIS — F4321 Adjustment disorder with depressed mood: Secondary | ICD-10-CM

## 2018-01-05 LAB — GLUCOSE, CAPILLARY
GLUCOSE-CAPILLARY: 111 mg/dL — AB (ref 70–99)
GLUCOSE-CAPILLARY: 113 mg/dL — AB (ref 70–99)
GLUCOSE-CAPILLARY: 174 mg/dL — AB (ref 70–99)
GLUCOSE-CAPILLARY: 186 mg/dL — AB (ref 70–99)
GLUCOSE-CAPILLARY: 215 mg/dL — AB (ref 70–99)
Glucose-Capillary: 145 mg/dL — ABNORMAL HIGH (ref 70–99)
Glucose-Capillary: 127 mg/dL — ABNORMAL HIGH (ref 70–99)

## 2018-01-05 LAB — BLOOD GAS, VENOUS
Acid-base deficit: 17.9 mmol/L — ABNORMAL HIGH (ref 0.0–2.0)
BICARBONATE: 8.4 mmol/L — AB (ref 20.0–28.0)
O2 SAT: 80.4 %
PCO2 VEN: 21 mmHg — AB (ref 44.0–60.0)
PO2 VEN: 50.4 mmHg — AB (ref 32.0–45.0)
Patient temperature: 37
pH, Ven: 7.225 — ABNORMAL LOW (ref 7.250–7.430)

## 2018-01-05 LAB — URINE CULTURE: Culture: NO GROWTH

## 2018-01-05 LAB — BASIC METABOLIC PANEL
ANION GAP: 6 (ref 5–15)
BUN: 23 mg/dL — AB (ref 6–20)
CHLORIDE: 124 mmol/L — AB (ref 98–111)
CO2: 20 mmol/L — ABNORMAL LOW (ref 22–32)
Calcium: 8.2 mg/dL — ABNORMAL LOW (ref 8.9–10.3)
Creatinine, Ser: 0.68 mg/dL (ref 0.44–1.00)
GFR calc Af Amer: >60 mL/min (ref >60)
GLUCOSE: 120 mg/dL — AB (ref 70–99)
Potassium: 3.9 mmol/L (ref 3.5–5.1)
Sodium: 150 mmol/L — ABNORMAL HIGH (ref 135–145)

## 2018-01-05 LAB — CBC
HCT: 36.8 % (ref 36.0–46.0)
Hemoglobin: 12.5 g/dL (ref 12.0–15.0)
MCH: 29.3 pg (ref 26.0–34.0)
MCHC: 34 g/dL (ref 30.0–36.0)
MCV: 86.2 fL (ref 78.0–100.0)
PLATELETS: 212 10*3/uL (ref 150–400)
RBC: 4.27 MIL/uL (ref 3.87–5.11)
RDW: 14.4 % (ref 11.5–15.5)
WBC: 11.1 10*3/uL — AB (ref 4.0–10.5)

## 2018-01-05 LAB — HIV ANTIBODY (ROUTINE TESTING W REFLEX): HIV SCREEN 4TH GENERATION: NONREACTIVE

## 2018-01-05 LAB — SODIUM: SODIUM: 140 mmol/L (ref 135–145)

## 2018-01-05 MED ORDER — IBUPROFEN 200 MG PO TABS: 600.0000 mg | ORAL_TABLET | Freq: Four times a day (QID) | ORAL | Status: DC | PRN

## 2018-01-05 MED ORDER — ALPRAZOLAM 0.25 MG PO TABS
0.2500 mg | ORAL_TABLET | Freq: Once | ORAL | Status: AC
  Administered 2018-01-05: 0.25 mg via ORAL
  Filled 2018-01-05: qty 1

## 2018-01-05 MED ORDER — INSULIN ASPART 100 UNIT/ML ~~LOC~~ SOLN
4.0000 [IU] | Freq: Three times a day (TID) | SUBCUTANEOUS | Status: DC
  Administered 2018-01-05 – 2018-01-06 (×5): 4 [IU] via SUBCUTANEOUS

## 2018-01-05 MED ORDER — METOCLOPRAMIDE HCL 5 MG/ML IJ SOLN
10.0000 mg | Freq: Once | INTRAMUSCULAR | Status: AC
  Administered 2018-01-05: 10 mg via INTRAVENOUS
  Filled 2018-01-05: qty 2

## 2018-01-05 MED ORDER — IPRATROPIUM BROMIDE 0.02 % IN SOLN: 0.5000 mg | RESPIRATORY_TRACT | Status: DC | PRN

## 2018-01-05 MED ORDER — KETOROLAC TROMETHAMINE 30 MG/ML IJ SOLN
30.0000 mg | Freq: Four times a day (QID) | INTRAMUSCULAR | Status: DC | PRN
  Administered 2018-01-05 – 2018-01-06 (×4): 30 mg via INTRAVENOUS
  Filled 2018-01-05 (×4): qty 1

## 2018-01-05 MED ORDER — LEVALBUTEROL HCL 0.63 MG/3ML IN NEBU: 0.6300 mg | INHALATION_SOLUTION | RESPIRATORY_TRACT | Status: DC | PRN

## 2018-01-05 NOTE — Progress Notes (Signed)
Pt arrived to the floor via wheelchair. Pt denies pain at this time with no s/s of distress noted. Pt orientated to the room and call bell. Pts assessment is unchanged from earlier documentation. Will continue to monitor

## 2018-01-05 NOTE — Progress Notes (Signed)
PROGRESS NOTE    MARICEL SIRON  SUP:[id number] DOB: 12-08-1970 DOA: 01/03/2018 PCP: System, Pcp Not In    Brief Narrative, Patient is a 47 year old female history of type 2 diabetes poorly controlled, obesity, tobacco abuse, bipolar disorder presented with shortness of breath and rapid breathing.  Patient initially stated was deep cleaning with bleach 4 days prior to admission evaluated by EMS given 125 of Solu-Medrol and duo nebs on route to the hospital for presumed respiratory distress.  No cough, no fever, no nausea, no wheezing noted.  Patient noted to be in rapid breathing.  Patient seen in the ED was given some breathing treatments chest x-ray was clear.  CT angiogram chest was negative for PE.  Patient noted to be in DKA placed on the glucose stabilizer and admitted to the stepdown unit.   Assessment & Plan:   Principal Problem:   Adjustment disorder with depressed mood Active Problems:   Migraine headache   Essential hypertension   Tobacco use disorder   Bipolar disorder with moderate depression (HCC)   DM (diabetes mellitus), type 2, [name]   DKA, type 2, not at goal [facility])   Thrush   Oral thrush   Acute lower UTI  #1 DKA and type II diabetic/poorly controlled diabetes mellitus--hemoglobin A1c 11.2 01/04/2018 Likely secondary to medical noncompliance and probable UTI.  Patient states had run out of her medication since March 2019 and due to insurance/DSS issues has been unable to get her medications.  Patient also states does not have a PCP and unable to get refills.  Chest x-ray CT chest negative for any acute pulmonary abnormalities.  Urinalysis was moderate leukocytes, nitrite negative, 11-20 WBC.  Patient currently afebrile.  Patient with no chest pain.  EKG with a sinus tachycardia with no ischemic changes noted.  Patient on admission was noted to have a blood glucose of 489, bicarb of 9, anion gap of 15, beta hydroxybutyric acid was elevated at 3.20.  Urinalysis  with greater than 500 glucose, 20 ketones, 30 protein.  Budding yeast noted on urinalysis.  Patient was on the glucose stabilizer on admission has been transitioned to Levemir 15 units daily.  Bicarb now at 20.  CBG of 113 this morning.  Continue sliding scale.  Decrease IV fluids to 75 cc/h.  Continue bicarb tablets x1 more day however if sodium is still elevated will discontinue bicarb tablets.  Continue empiric IV Rocephin for probable UTI.  Supportive care.  Diabetic coordinator following.    2.  Hypertension Improved on lisinopril 5 mg daily.  Patient with issues getting the medication.  Case manager consultation pending.  3.  Oral thrush/??  Candiduria Likely due to poorly controlled diabetes.  Continue nystatin swish and swallow.  Placed on Diflucan as patient initially complained of some vaginal itching and concerns for vaginal candidiasis however has retracted her statements and denies any vaginal itching.  Budding yeast noted on urinalysis.  HIV pending.  Continue Diflucan.  4.  Tobacco abuse Tobacco cessation.  Patient states had a bad reaction to nicotine patch a few years ago and as such does not want it at this time.  Patient interested in tobacco cessation.  Continue Dulera, scheduled Xopenex and Atrovent nebs.  5.  Bipolar disorder/moderate depression Patient has been out of medications for several months prior to admission.  Patient with a flat affect and what seems like a depressed mood.  Patient seen in consultation by psychiatry and patient has been started on Prozac.  Per  psychiatry patient to follow-up with [facility] in the outpatient setting for medication management.   6.  History of asthma Continue scheduled nebs, Dulera.  7.  Migraine headache Patient still with complaints of migraine headache.  Patient states Ultram did not help.  Discontinue Ultram.  Reglan 10 mg IV x1.  IV Toradol.   #8 probable UTI Urinalysis with moderate leukocytes, nitrite negative, 11-20 WBCs,  greater than 50 RBCs.  Patient presenting in DKA.  Urine cultures pending.  Continue empiric IV Rocephin.  9.  Hypernatremia Check a sodium level.  If still elevated will place on D5W.   DVT prophylaxis: Lovenox Code Status: DNR.  Discussed with patient again. Family Communication: Updated patient.  No family at bedside. Disposition Plan: Transfer to MedSurg.     Consultants:   Psychiatry: Dr. Jannifer Franklin 01/04/2018  Procedures:   CT angiogram chest 01/03/2018  Chest x-ray 01/03/2018  Antimicrobials: IV Rocephin 01/04/2018   Subjective: Patient sleeping.  Arousable.  Complaining of headache states Ultram did not help her headache at home.  Denies any chest pain no shortness of breath.  No abdominal pain.   Objective: Vitals:   01/05/18 0400 01/05/18 0733 01/05/18 0750 01/05/18 0808  BP:  128/68    Pulse: (!) 47 60  78  Resp: (!) 24 17  17   Temp: 98.4 F (36.9 C)  98.3 F (36.8 C)   TempSrc: Oral  Oral   SpO2: 92% 93%  97%  Weight:      Height:        Intake/Output Summary (Last 24 hours) at 01/05/2018 0911 Last data filed at 01/05/2018 0756 Gross per 24 hour  Intake 5046 ml  Output 1400 ml  Net 3646 ml   Filed Weights   01/03/18 1838 01/03/18 2100  Weight: 92.3 kg (203 lb 8 oz) 85.9 kg (189 lb 6 oz)    Examination:  General exam: Appears calm and comfortable.  Oral thrush Respiratory system: Lungs clear to auscultation bilaterally.  No rhonchi.  No wheezing.  No crackles.  Normal respiratory effort.   Cardiovascular system: Regular rate and rhythm no murmurs rubs or gallops.  No JVD.  No lower extremity edema.  Gastrointestinal system: Abdomen is soft, nontender, nondistended, positive bowel sounds.  No hepatosplenomegaly.  Central nervous system: Alert and oriented. No focal neurological deficits. Extremities: Symmetric 5 x 5 power. Skin: No rashes, lesions or ulcers Psychiatry: Judgement and insight appear poor to fair. Mood depressed & affect flat.      Data Reviewed: I have personally reviewed following labs and imaging studies  CBC: Recent Labs  Lab 01/03/18 1245 01/03/18 1449 01/04/18 0825 01/05/18 0347  WBC 18.9*  --  13.4* 11.1*  NEUTROABS 15.8*  --  11.7*  --   HGB 15.3* 16.3* 13.3 12.5  HCT 45.2 48.0* 39.7 36.8  MCV 87.3  --  86.7 86.2  PLT 281  --  247 212   Basic Metabolic Panel: Recent Labs  Lab 01/03/18 2218  01/04/18 0321 01/04/18 0825 01/04/18 1140 01/04/18 1629 01/05/18 0347  NA 138   < > 142 144 145 139 150*  K 4.3   < > 4.5 4.4 4.2 4.0 3.9  CL 116*   < > 119* 119* 118* 112* 124*  CO2 12*   < > 15* 18* 17* 17* 20*  GLUCOSE 299*   < > 131* 156* 207* 211* 120*  BUN 32*   < > 32* 33* 32* 35* 23*  CREATININE 1.13*   < >  0.98 0.98 0.83 1.01* 0.68  CALCIUM 8.4*   < > 8.4* 8.7* 8.4* 8.3* 8.2*  MG 2.1  --   --  2.2  --   --   --   PHOS 1.8*  --   --  2.8  --   --   --    < > = values in this interval not displayed.   GFR: Estimated Creatinine Clearance: 93.1 mL/min (by C-G formula based on SCr of 0.68 mg/dL). Liver Function Tests: Recent Labs  Lab 01/04/18 0118  AST 12*  ALT 20  ALKPHOS 83  BILITOT 0.8  PROT 6.4*  ALBUMIN 3.3*   No results for input(s): LIPASE, AMYLASE in the last 168 hours. No results for input(s): AMMONIA in the last 168 hours. Coagulation Profile: No results for input(s): INR, PROTIME in the last 168 hours. Cardiac Enzymes: Recent Labs  Lab 01/03/18 2218 01/04/18 0118 01/04/18 0825  TROPONINI 0.05* 0.05* 0.04*   BNP (last 3 results) No results for input(s): PROBNP in the last 8760 hours. HbA1C: Recent Labs    01/04/18 0118  HGBA1C 11.2*   CBG: Recent Labs  Lab 01/04/18 1403 01/04/18 2026 01/05/18 0000 01/05/18 0324 01/05/18 0749  GLUCAP 207* 243* 186* 111* 113*   Lipid Profile: No results for input(s): CHOL, HDL, LDLCALC, TRIG, CHOLHDL, LDLDIRECT in the last 72 hours. Thyroid Function Tests: No results for input(s): TSH, T4TOTAL, FREET4, T3FREE,  THYROIDAB in the last 72 hours. Anemia Panel: No results for input(s): VITAMINB12, FOLATE, FERRITIN, TIBC, IRON, RETICCTPCT in the last 72 hours. Sepsis Labs: Recent Labs  Lab 01/03/18 2218  LATICACIDVEN 1.0    Recent Results (from the past 240 hour(s))  MRSA PCR Screening     Status: None   Collection Time: 01/03/18  9:08 PM  Result Value Ref Range Status   MRSA by PCR NEGATIVE NEGATIVE Final    Comment:        The GeneXpert MRSA Assay (FDA approved for NASAL specimens only), is one component of a comprehensive MRSA colonization surveillance program. It is not intended to diagnose MRSA infection nor to guide or monitor treatment for MRSA infections. Performed at [facility], [address], [place], [place] [address]          Radiology Studies: Dg Chest 2 View  Result Date: 01/03/2018 CLINICAL DATA:  Shortness of breath. EXAM: CHEST - 2 VIEW COMPARISON:  01/19/2016 FINDINGS: Cardiomediastinal silhouette is normal. Mediastinal contours appear intact. There is no evidence of focal airspace consolidation or pneumothorax. Elevation of the right hemidiaphragm. Osseous structures are without acute abnormality. Soft tissues are grossly normal. IMPRESSION: Elevation of the right hemidiaphragm may represent eventration of the diaphragm or less likely subpulmonic pleural effusion. Confirmation with decubital radiograph, right side down, may be considered if found clinically indicated. Otherwise no evidence airspace consolidation. Electronically Signed   By: Ted Mcalpine M.D.   On: 01/03/2018 13:17   Ct Angio Chest Pe W/cm &/or Wo Cm  Result Date: 01/03/2018 CLINICAL DATA:  Shortness of breath for several days, possible inhalation injury EXAM: CT ANGIOGRAPHY CHEST WITH CONTRAST TECHNIQUE: Multidetector CT imaging of the chest was performed using the standard protocol during bolus administration of intravenous contrast. Multiplanar CT image reconstructions and  MIPs were obtained to evaluate the vascular anatomy. The examination is somewhat limited by patient motion artifact. CONTRAST:  ISOVUE-370 IOPAMIDOL (ISOVUE-370) INJECTION 76% COMPARISON:  None. FINDINGS: Cardiovascular: Thoracic aorta shows a normal branching pattern. No aneurysmal dilatation or dissection  is seen. No significant cardiac enlargement is noted. Mild coronary calcifications are seen. The pulmonary artery shows a normal branching pattern without evidence of filling defect to suggest pulmonary embolism. Mediastinum/Nodes: Thoracic inlet is within normal limits. No sizable hilar or mediastinal adenopathy is noted. The esophagus is within normal limits. Lungs/Pleura: Lungs are well aerated bilaterally. No focal infiltrate or sizable effusion is seen. No sizable parenchymal nodule is noted. Upper Abdomen: Fatty infiltration of the liver is noted. The remainder of the upper abdomen is within normal limits. Musculoskeletal: Degenerative changes of the thoracic spine are noted. Review of the MIP images confirms the above findings. IMPRESSION: No evidence of pulmonary emboli. No acute abnormality is noted. Electronically Signed   By: Alcide Clever M.D.   On: 01/03/2018 15:27        Scheduled Meds: . chlorhexidine  15 mL Mouth Rinse BID  . enoxaparin (LOVENOX) injection  40 mg Subcutaneous QHS  . fluconazole  100 mg Oral Daily  . FLUoxetine  10 mg Oral Daily  . insulin aspart  0-24 Units Subcutaneous Q4H  . insulin aspart  4 Units Subcutaneous TID WC  . insulin detemir  15 Units Subcutaneous Daily  . lisinopril  5 mg Oral Daily  . mouth rinse  15 mL Mouth Rinse q12n4p  . mometasone-formoterol  2 puff Inhalation BID  . nystatin  5 mL Mouth/Throat QID  . sodium bicarbonate  650 mg Oral BID   Continuous Infusions: . sodium chloride 125 mL/hr at 01/05/18 0600  . cefTRIAXone (ROCEPHIN)  IV Stopped (01/04/18 1254)     LOS: 1 day    Time spent: 40 minutes    Ramiro Harvest,  MD [facility] Pager [phone]  If 7PM-7AM, please contact night-coverage [email or web address] Password [email or web address] 01/05/2018, 9:11 AM

## 2018-01-05 NOTE — Progress Notes (Signed)
Inpatient Diabetes Program Recommendations  AACE/ADA: New Consensus Statement on Inpatient Glycemic Control (2015)  Target Ranges:  Prepandial:   less than 140 mg/dL      Peak postprandial:   less than 180 mg/dL (1-2 hours)      Critically ill patients:  140 - 180 mg/dL   Lab Results  Component Value Date   GLUCAP 127 (H) 01/05/2018   HGBA1C 11.2 (H) 01/04/2018    Review of Glycemic Control  Good blood sugar control today. Spoke with pt regarding reason for not taking insulin x past few months. Pt states her Medicaid case worker changed recently and she was unable to get a PCP. Pt ran out of insulin and did not have another prescription. Needs assistance with obtaining PCP through Medicaid. Case Manager states pt should go to DSS in person to get PCP. Discussed importance of taking insulin as prescribed and can get Novolin insulin at [facility] for $25 (syringe) and $44 for insulin pen.  Will continue to follow blood sugar trends. Reinforce diabetes care in am.  Discussed above with MD.  Thank you. Ailene Ards, RD, LDN, CDE Inpatient Diabetes Coordinator [phone]

## 2018-01-06 DIAGNOSIS — E1110 Type 2 diabetes mellitus with ketoacidosis without coma: Secondary | ICD-10-CM | POA: Diagnosis present

## 2018-01-06 DIAGNOSIS — B373 Candidiasis of vulva and vagina: Secondary | ICD-10-CM

## 2018-01-06 LAB — CBC WITH DIFFERENTIAL/PLATELET
BASOS ABS: 0 10*3/uL (ref 0.0–0.1)
Basophils Relative: 0 %
EOS PCT: 1 %
Eosinophils Absolute: 0.1 10*3/uL (ref 0.0–0.7)
HCT: 37.9 % (ref 36.0–46.0)
Hemoglobin: 13 g/dL (ref 12.0–15.0)
LYMPHS ABS: 3 10*3/uL (ref 0.7–4.0)
LYMPHS PCT: 40 %
MCH: 29.4 pg (ref 26.0–34.0)
MCHC: 34.3 g/dL (ref 30.0–36.0)
MCV: 85.7 fL (ref 78.0–100.0)
MONO ABS: 0.4 10*3/uL (ref 0.1–1.0)
MONOS PCT: 5 %
NEUTROS ABS: 4.1 10*3/uL (ref 1.7–7.7)
Neutrophils Relative %: 54 %
Platelets: 203 10*3/uL (ref 150–400)
RBC: 4.42 MIL/uL (ref 3.87–5.11)
RDW: 14 % (ref 11.5–15.5)
WBC: 7.5 10*3/uL (ref 4.0–10.5)

## 2018-01-06 LAB — BASIC METABOLIC PANEL
ANION GAP: 6 (ref 5–15)
BUN: 12 mg/dL (ref 6–20)
CALCIUM: 7.9 mg/dL — AB (ref 8.9–10.3)
CO2: 23 mmol/L (ref 22–32)
CREATININE: 0.59 mg/dL (ref 0.44–1.00)
Chloride: 112 mmol/L — ABNORMAL HIGH (ref 98–111)
GFR calc Af Amer: >60 mL/min (ref >60)
GLUCOSE: 137 mg/dL — AB (ref 70–99)
Potassium: 3.2 mmol/L — ABNORMAL LOW (ref 3.5–5.1)
Sodium: 141 mmol/L (ref 135–145)

## 2018-01-06 LAB — GLUCOSE, CAPILLARY
Glucose-Capillary: 128 mg/dL — ABNORMAL HIGH (ref 70–99)
Glucose-Capillary: 118 mg/dL — ABNORMAL HIGH (ref 70–99)
Glucose-Capillary: 220 mg/dL — ABNORMAL HIGH (ref 70–99)

## 2018-01-06 MED ORDER — POTASSIUM CHLORIDE CRYS ER 20 MEQ PO TBCR
40.0000 meq | EXTENDED_RELEASE_TABLET | ORAL | Status: AC
  Administered 2018-01-06 (×2): 40 meq via ORAL
  Filled 2018-01-06 (×2): qty 2

## 2018-01-06 MED ORDER — BLOOD GLUCOSE METER KIT: PACK | 0 refills | Status: DC

## 2018-01-06 MED ORDER — INSULIN ASPART 100 UNIT/ML FLEXPEN: 3.0000 [IU] | PEN_INJECTOR | Freq: Three times a day (TID) | SUBCUTANEOUS | 0 refills | Status: DC

## 2018-01-06 MED ORDER — "PEN NEEDLES 3/16"" 31G X 5 MM MISC": 15.0000 [IU] | Freq: Every day | 0 refills | Status: DC

## 2018-01-06 MED ORDER — SODIUM CHLORIDE 0.9 % IV SOLN
INTRAVENOUS | Status: DC | PRN
  Administered 2018-01-06: 250 mL via INTRAVENOUS

## 2018-01-06 MED ORDER — LISINOPRIL 10 MG PO TABS: 10.0000 mg | ORAL_TABLET | Freq: Every day | ORAL | 0 refills | Status: DC

## 2018-01-06 MED ORDER — FLUCONAZOLE 100 MG PO TABS: 100.0000 mg | ORAL_TABLET | Freq: Every day | ORAL | 0 refills | Status: AC

## 2018-01-06 MED ORDER — NYSTATIN 100000 UNIT/ML MT SUSP: 5.0000 mL | Freq: Four times a day (QID) | OROMUCOSAL | 0 refills | Status: AC

## 2018-01-06 MED ORDER — FLUOXETINE HCL 10 MG PO CAPS: 10.0000 mg | ORAL_CAPSULE | Freq: Every day | ORAL | 0 refills | Status: DC

## 2018-01-06 MED ORDER — ALBUTEROL SULFATE HFA 108 (90 BASE) MCG/ACT IN AERS: 2.0000 | INHALATION_SPRAY | Freq: Four times a day (QID) | RESPIRATORY_TRACT | 0 refills | Status: DC | PRN

## 2018-01-06 MED ORDER — LISINOPRIL 10 MG PO TABS
10.0000 mg | ORAL_TABLET | Freq: Every day | ORAL | Status: DC
  Administered 2018-01-06: 10 mg via ORAL
  Filled 2018-01-06: qty 1

## 2018-01-06 MED ORDER — IBUPROFEN 200 MG PO TABS: 600.0000 mg | ORAL_TABLET | Freq: Four times a day (QID) | ORAL | Status: DC | PRN

## 2018-01-06 MED ORDER — IBUPROFEN 200 MG PO TABS: 400.0000 mg | ORAL_TABLET | Freq: Four times a day (QID) | ORAL | Status: DC | PRN

## 2018-01-06 MED ORDER — PROMETHAZINE HCL 25 MG PO TABS: 25.0000 mg | ORAL_TABLET | Freq: Four times a day (QID) | ORAL | 0 refills | Status: DC | PRN

## 2018-01-06 MED ORDER — INSULIN DETEMIR 100 UNIT/ML FLEXPEN: 15.0000 [IU] | PEN_INJECTOR | Freq: Every day | SUBCUTANEOUS | 0 refills | Status: DC

## 2018-01-06 NOTE — Progress Notes (Signed)
LCSW consulted for [facility] referral.   LCSW made follow up not on patient's AVS.   LCSW attempted to meet with patient. Patient asleep and would not arouse to LCSW knock or voice.   LCSW will follow up with resources.   Beulah Gandy [place] CSW [phone]

## 2018-01-06 NOTE — Discharge Instructions (Signed)
Go to the social security office on [address] and get the Medicaid representative to assign you a primary care doctor

## 2018-01-06 NOTE — Clinical Social Work Note (Deleted)
Clinical Social Work Assessment  Patient Details  Name: [name] [name] MRN: [id number] Date of Birth: [birth year]  Date of referral:  01/06/18               Reason for consult:  Mental Health Concerns                Permission sought to share information with:  Family Supports Permission granted to share information::  Yes, Verbal Permission Granted  Name::        Agency::     Relationship::  Spouse and Mother  Contact Information:     Housing/Transportation Living arrangements for the past 2 months:  Single Family Home Source of Information:  Patient Patient Interpreter Needed:  None Criminal Activity/Legal Involvement Pertinent to Current Situation/Hospitalization:  No - Comment as needed Significant Relationships:    Lives with:  Spouse Do you feel safe going back to the place where you live?  Yes Need for family participation in patient care:  Yes (Comment)  Care giving concerns:  No care giving concerns at the time of assessment.    Social Worker assessment / plan:  LCSW consulted for outpatient mental health follow up.   Patient admitted for Adjustment disorder with depressed mood.  LCSW met at bedside with patient. Patient's mother is present. LCSW explained role and reason for visit.   Patient reports that her husband just lost his job. Patient is not seen by an out patient provider. Patient reports she was seen  Once in the past when she was in a stressful situation with her brother who is a recovering addict.   Patient reports that she works full time and has [employer]. Patient states that she lives with her spouse. Patient reports that he family is support to her. LCSW provided outpatient resources to patient.   PLAN: Patient will follow up with out patient resources at dc.    Employment status:  [employer] information:  Managed Care(BCBS) PT Recommendations:  Not assessed at this time Information / Referral to community resources:      Patient/Family's Response to care:  Patient is thankful for LCSW visit and resources.   Patient/Family's Understanding of and Emotional Response to Diagnosis, Current Treatment, and Prognosis:  Patient is realistic about her her current condition. Patient is accepting and willing to follow up with resources.   Emotional Assessment Appearance:  Appears younger than stated age Attitude/Demeanor/Rapport:    Affect (typically observed):  Accepting, Calm, Pleasant Orientation:  Oriented to Self, Oriented to Place, Oriented to  Time, Oriented to Situation Alcohol / Substance use:  Not Applicable Psych involvement (Current and /or in the community):  No (Comment)  Discharge Needs  Concerns to be addressed:  Coping/Stress Concerns, Medication Concerns, Mental Health Concerns Readmission within the last 30 days:  No Current discharge risk:  None Barriers to Discharge:  Continued Medical Work up   [employer], LCSW 01/06/2018, 9:47 AM

## 2018-01-06 NOTE — Clinical Social Work Note (Signed)
Clinical Social Work Assessment  Patient Details  Name: [name] [name] MRN: [id number] Date of Birth: Jul 02, 1970  Date of referral:  01/06/18               Reason for consult:  Mental Health Concerns                Permission sought to share information with:  Family Supports Permission granted to share information::  Yes, Verbal Permission Granted  Name::     Son  Agency::     Relationship::  Spouse and Mother  Contact Information:     Housing/Transportation Living arrangements for the past 2 months:  Single Family Home Source of Information:  Patient Patient Interpreter Needed:  None Criminal Activity/Legal Involvement Pertinent to Current Situation/Hospitalization:  No - Comment as needed Significant Relationships:    Lives with:  Self Do you feel safe going back to the place where you live?  Yes Need for family participation in patient care:  Yes (Comment)  Care giving concerns:  Patient is not compliant with meds. Does not have a PCP.    Social Worker assessment / plan:  LCSW consulted for out patient mental health resources.   LCSW met at bedside with patient and explained role and reason for visit.   Patient reports that she lives alone and has not seen a psychiatrist or therapist in 3 years. Previously seen by Dr. Wynetta Emery in [place], her PCP who also managed her meds.   Patient reports that she lives alone and is unemployed. Patient list her son as a support.   LCSW went over out patient resources. Patietn states " I hate that place" In reference to [facility]. LSCW left patient with a list of resources. Patient states she prefers for her PCP to manage her meds.   According to chart patient does not have PCP and RNCM to follow up.  Employment status:  Unemployed Forensic scientist:  Medicaid In State(BCBS) PT Recommendations:  Not assessed at this time Information / Referral to community resources:     Patient/Family's Response to care:  Unable to access. Patient  appeared uninterested.   Patient/Family's Understanding of and Emotional Response to Diagnosis, Current Treatment, and Prognosis:  Patient does not seem realistic about her mental health condition or uncontrolled diabetes. Patient was not receptive to out patient resources.   Emotional Assessment Appearance:  Appears stated age Attitude/Demeanor/Rapport:    Affect (typically observed):  Accepting, Calm Orientation:  Oriented to Self, Oriented to Place, Oriented to  Time, Oriented to Situation Alcohol / Substance use:  Not Applicable Psych involvement (Current and /or in the community):  No (Comment)  Discharge Needs  Concerns to be addressed:  Coping/Stress Concerns, Medication Concerns, Mental Health Concerns Readmission within the last 30 days:  No Current discharge risk:  None Barriers to Discharge:  No Barriers Identified   Servando Snare, LCSW 01/06/2018, 11:47 AM

## 2018-01-06 NOTE — Progress Notes (Signed)
Inpatient Diabetes Program Recommendations  AACE/ADA: New Consensus Statement on Inpatient Glycemic Control (2015)  Target Ranges:  Prepandial:   less than 140 mg/dL      Peak postprandial:   less than 180 mg/dL (1-2 hours)      Critically ill patients:  140 - 180 mg/dL   Lab Results  Component Value Date   GLUCAP 118 (H) 01/06/2018   HGBA1C 11.2 (H) 01/04/2018    Review of Glycemic Control  Spoke with pt this am regarding her HgbA1C and importance of improving her glucose control. Pt states she will go to DSS to obtain PCP. Pt states she doesn't move around a lot because she has knee problems. States she needs a knee replacement, but has no transportation to go to an MD. Explained to pt that Medicaid will provide transportation to MD appts and pharmacy.   In reviewing medical records in Epic, pt has been "trying to get a PCP since 2017."  Inpatient Diabetes Program Recommendations:     For discharge:  Levemir 15 units QD Pen - Levemir Flextouch Pen Novolog 3 units tidwc for meal coverage insulin. Novolog Flexpen  Pt states she understands importance of obtaining PCP and taking her insulin as prescribed.  Discussed with MD.  Thank you. Ailene Ards, RD, LDN, CDE Inpatient Diabetes Coordinator [phone]

## 2018-01-06 NOTE — Discharge Summary (Addendum)
Physician Discharge Summary  [name] [name] HYW:[id number] DOB: [birth year] DOA: 01/03/2018  PCP: System, Pcp Not In  Admit date: 01/03/2018 Discharge date: 01/06/2018  Time spent: 55 minutes  Recommendations for Outpatient Follow-up:  1. Follow-up with PCP in 2 weeks.  On follow-up patient's diabetes will need to be reassessed as well as her blood pressure.  Patient will need a basic metabolic profile done to follow-up on electrolytes and renal function. 2. Follow-up at [facility] for bipolar disorder/depression.  Patient started on Prozac during this hospitalization.   Discharge Diagnoses:  Principal Problem:   DKA, type 2 ([place]) Active Problems:   Migraine headache   Essential hypertension   Tobacco use disorder   Bipolar disorder with moderate depression ([place])   DM (diabetes mellitus), type 2, uncontrolled ([place])   DKA, type 2, not at goal [facility])   Thrush   Oral thrush   Acute lower UTI   Adjustment disorder with depressed mood   Discharge Condition: Stable and improved  Diet recommendation: Carb modified diet  Filed Weights   01/03/18 1838 01/03/18 2100  Weight: 92.3 kg (203 lb 8 oz) 85.9 kg (189 lb 6 oz)    History of present illness:  Per Dr Nichola Sizer is a 47 y.o. female with medical history significant of DM 2 poorly controlled, obesity, tobacco abuse, bipolar disorder Presented with shortness of breath and rapid breathing.  Initially patient stated that she was deep cleaning with bleach 4 days ago she was evaluated by EMS she was given 125 of Solu-Medrol and DuoNeb in route to the hospital for presumed respiratory distress patient refused to come to the hospital at that time.  Have not had any cough fever no nausea no wheezing but continued to have rapid breathing She presented to emergency department initially felt to have bronchitis.  Was given breathing treatments chest x-ray was clear proceeded to get a CTA to rule out PE.  Lab work was noted to have  evidence of leukocytosis hemoconcentration metabolic acidosis but also respiratory alkalosis her blood sugar was noted to be 489 if bicarb down to 9.  VBG 7.225/2 21 With AG of 15  History of uncontrolled DM2 last Hg A1C 12.9 stated have not used insulin since January due to problems getting Medicaid. She had problem accessing any of her medications. In the past per records patient was in denial regarding her diagnosis of diabetes and were not following up with PCP or using insulin.  While in ER: Initially treated for bronchitis then was noted to likely to be in DKA started on IV fluids and glucose stabilizer with  improvement    Hospital Course:  1 DKA and type II diabetic/poorly controlled diabetes mellitus--hemoglobin A1c 11.2 01/04/2018 Likely secondary to medical noncompliance and probable UTI.  Patient states had run out of her medication since March 2019 and due to insurance/DSS issues has been unable to get her medications.  Patient also states does not have a PCP and unable to get refills.  Chest x-ray CT chest negative for any acute pulmonary abnormalities.  Urinalysis was moderate leukocytes, nitrite negative, 11-20 WBC. EKG with a sinus tachycardia with no ischemic changes noted.   Patient on admission was noted to have a blood glucose of 489, bicarb of 9, anion gap of 15, beta hydroxybutyric acid was elevated at 3.20.  Urinalysis with greater than 500 glucose, 20 ketones, 30 protein.  Budding yeast noted on urinalysis.  Patient was on the glucose stabilizer on admission and subsequently  transitioned to Levemir 15 units daily.  Bicarb improved.  Patient subsequently transitioned to Levemir 15 units daily and placed on sliding scale insulin.  Patient's blood glucose was well controlled during the hospitalization.  Patient also received 3 days of IV Rocephin for initial concerns for UTI.  Patient was discharged home on Levemir 15 units daily as well as NovoLog 3 units with meals.   Outpatient follow-up with PCP.    2.  Hypertension Patient initially started on lisinopril 5 mg daily.  Dose was adjusted to 10 mg daily which patient will be discharged on.  Outpatient follow-up with PCP.    3.  Oral thrush/??  Candiduria Likely due to poorly controlled diabetes.    Patient started on nystatin swish and swallow as well as Diflucan.  Patient was noted to have candiduria per urinalysis.  Patient initially did complain of some vaginal itching which concerns for vaginal candidiasis however retracted her statement and denied any vaginal itching.  Patient will be discharged on 4 more days of oral Diflucan to complete a one-week course of treatment.    4.  Tobacco abuse Tobacco cessation.  Patient stated had a bad reaction to nicotine patch a few years ago and as such refused nicotine patch.  Patient was maintained on Dulera and scheduled nebs.  Outpatient follow-up.    5.  Bipolar disorder/moderate depression Patient had been out of medications for several months prior to admission.  Patient with a flat affect and what seemed like a depressed mood.  Patient seen in consultation by psychiatry and patient started on Prozac.  Per psychiatry patient to follow-up with [facility] in the outpatient setting for medication management.   Patient be discharged on Prozac.  6.  History of asthma Patient maintained on Dulera scheduled nebs.   7.  Migraine headache Patient with complaints of migraine headache.  Patient was initially placed on Ultram which she stated did not help.  Ultram was discontinued.  Patient placed on IV Toradol with resolution of migraine headache.  Outpatient follow-up.    #8 probable UTI Urinalysis with moderate leukocytes, nitrite negative, 11-20 WBCs, greater than 50 RBCs.  Patient presented in DKA.    Patient initially placed on IV Rocephin and received 3 days of IV Rocephin.  Urine cultures negative.    9.  Hypernatremia Patient noted one morning to have a  sodium level of 150.  Repeat labs showed elevated sodium level was erroneous.  10.  Hypokalemia Repleted.  Outpatient follow-up.     Procedures:  CT angiogram chest 01/03/2018  Chest x-ray 01/03/2018   Consultations:  Psychiatry: Dr. Darleene Cleaver 01/04/2018   Discharge Exam: Vitals:   01/06/18 0745 01/06/18 1416  BP:  (!) 170/89  Pulse: 76 94  Resp: 16 18  Temp:  97.6 F (36.4 C)  SpO2: 96% 97%    General: NAD Cardiovascular: RRR Respiratory: CTAB  Discharge Instructions   Discharge Instructions    Diet Carb Modified   Complete by:  As directed    Increase activity slowly   Complete by:  As directed      Allergies as of 01/06/2018      Reactions   Tramadol Nausea Only   "feeling like I'm on fire"   Nicotine Rash   Patient states she is allergic to the Nicotine patch and nicotine gum and they cause her to break out in a rash.  She states she can smoke cigarettes.      Medication List    STOP taking these  medications   escitalopram 5 MG tablet Commonly known as:  LEXAPRO   insulin aspart protamine - aspart (70-30) 100 UNIT/ML FlexPen Commonly known as:  NOVOLOG MIX 70/30 FLEXPEN   metFORMIN 850 MG tablet Commonly known as:  GLUCOPHAGE     TAKE these medications   acetaminophen 500 MG tablet Commonly known as:  TYLENOL Take 1,000 mg by mouth every 6 (six) hours as needed for mild pain or headache.   albuterol 108 (90 Base) MCG/ACT inhaler Commonly known as:  PROVENTIL HFA;VENTOLIN HFA Inhale 2 puffs into the lungs every 6 (six) hours as needed for wheezing or shortness of breath.   blood glucose meter kit and supplies Dispense based on patient and insurance preference. Use up to four times daily as directed. (FOR ICD-10 E10.9, E11.9).   fluconazole 100 MG tablet Commonly known as:  DIFLUCAN Take 1 tablet (100 mg total) by mouth daily for 4 days. Start taking on:  01/07/2018   FLUoxetine 10 MG capsule Commonly known as:  PROZAC Take 1 capsule  (10 mg total) by mouth daily. Start taking on:  01/07/2018   ibuprofen 200 MG tablet Commonly known as:  ADVIL Take 2 tablets (400 mg total) by mouth every 6 (six) hours as needed for headache.   insulin aspart 100 UNIT/ML FlexPen Commonly known as:  NOVOLOG FLEXPEN Inject 3 Units into the skin 3 (three) times daily with meals.   Insulin Detemir 100 UNIT/ML Pen Commonly known as:  LEVEMIR FLEXTOUCH Inject 15 Units into the skin daily.   lisinopril 10 MG tablet Commonly known as:  PRINIVIL,ZESTRIL Take 1 tablet (10 mg total) by mouth daily. What changed:    medication strength  how much to take   multivitamin with minerals Tabs tablet Take 1 tablet by mouth daily.   nystatin 100000 UNIT/ML suspension Commonly known as:  MYCOSTATIN Use as directed 5 mLs (500,000 Units total) in the mouth or throat 4 (four) times daily for 4 days.   Pen Needles 3/16" 31G X 5 MM Misc 15 Units by Does not apply route daily.   promethazine 25 MG tablet Commonly known as:  PHENERGAN Take 1 tablet (25 mg total) by mouth every 6 (six) hours as needed for nausea or vomiting.   VITAMIN D PO Take 1 tablet by mouth daily.      Allergies  Allergen Reactions  . Tramadol Nausea Only    "feeling like I'm on fire"  . Nicotine Rash    Patient states she is allergic to the Nicotine patch and nicotine gum and they cause her to break out in a rash.  She states she can smoke cigarettes.   Follow-up Information    [facility]. Schedule an appointment as soon as possible for a visit in 1 week(s).   Specialty:  Behavioral Health Why:  Please call [facility] with in week of dc to schedule appointment.  Contact information: [place] [address] [phone]        PCP. Schedule an appointment as soon as possible for a visit in 2 week(s).            The results of significant diagnostics from this hospitalization (including imaging, microbiology, ancillary and laboratory) are listed  below for reference.    Significant Diagnostic Studies: Dg Chest 2 View  Result Date: 01/03/2018 CLINICAL DATA:  Shortness of breath. EXAM: CHEST - 2 VIEW COMPARISON:  01/19/2016 FINDINGS: Cardiomediastinal silhouette is normal. Mediastinal contours appear intact. There is no evidence of focal  airspace consolidation or pneumothorax. Elevation of the right hemidiaphragm. Osseous structures are without acute abnormality. Soft tissues are grossly normal. IMPRESSION: Elevation of the right hemidiaphragm may represent eventration of the diaphragm or less likely subpulmonic pleural effusion. Confirmation with decubital radiograph, right side down, may be considered if found clinically indicated. Otherwise no evidence airspace consolidation. Electronically Signed   By: Fidela Salisbury M.D.   On: 01/03/2018 13:17   Ct Angio Chest Pe W/cm &/or Wo Cm  Result Date: 01/03/2018 CLINICAL DATA:  Shortness of breath for several days, possible inhalation injury EXAM: CT ANGIOGRAPHY CHEST WITH CONTRAST TECHNIQUE: Multidetector CT imaging of the chest was performed using the standard protocol during bolus administration of intravenous contrast. Multiplanar CT image reconstructions and MIPs were obtained to evaluate the vascular anatomy. The examination is somewhat limited by patient motion artifact. CONTRAST:  152m ISOVUE-370 IOPAMIDOL (ISOVUE-370) INJECTION 76% COMPARISON:  None. FINDINGS: Cardiovascular: Thoracic aorta shows a normal branching pattern. No aneurysmal dilatation or dissection is seen. No significant cardiac enlargement is noted. Mild coronary calcifications are seen. The pulmonary artery shows a normal branching pattern without evidence of filling defect to suggest pulmonary embolism. Mediastinum/Nodes: Thoracic inlet is within normal limits. No sizable hilar or mediastinal adenopathy is noted. The esophagus is within normal limits. Lungs/Pleura: Lungs are well aerated bilaterally. No focal infiltrate or  sizable effusion is seen. No sizable parenchymal nodule is noted. Upper Abdomen: Fatty infiltration of the liver is noted. The remainder of the upper abdomen is within normal limits. Musculoskeletal: Degenerative changes of the thoracic spine are noted. Review of the MIP images confirms the above findings. IMPRESSION: No evidence of pulmonary emboli. No acute abnormality is noted. Electronically Signed   By: MInez CatalinaM.D.   On: 01/03/2018 15:27    Microbiology: Recent Results (from the past 240 hour(s))  Culture, Urine     Status: None   Collection Time: 01/03/18  5:20 PM  Result Value Ref Range Status   Specimen Description   Final    URINE, CLEAN CATCH Performed at [facility] [address], [place] [address]   Special Requests   Final    NONE Performed at [facility] [address], [place] [id number]   Culture   Final    NO GROWTH Performed at [facility] [address], [place] [address]   Report Status 01/05/2018 FINAL  Final  MRSA PCR Screening     Status: None   Collection Time: 01/03/18  9:08 PM  Result Value Ref Range Status   MRSA by PCR NEGATIVE NEGATIVE Final    Comment:        The GeneXpert MRSA Assay (FDA approved for NASAL specimens only), is one component of a comprehensive MRSA colonization surveillance program. It is not intended to diagnose MRSA infection nor to guide or monitor treatment for MRSA infections. Performed at [facility] [address] [place] [id number]     Labs: Basic Metabolic Panel: Recent Labs  Lab 01/03/18 2218  01/04/18 0825 01/04/18 1140 01/04/18 1629 01/05/18 0347 01/05/18 0931 01/06/18 0548  NA 138   < > 144 145 139 150* 140 141  K 4.3   < > 4.4 4.2 4.0 3.9  --  3.2*  CL 116*   < > 119* 118* 112* 124*  --  112*  CO2 12*   < > 18* 17* 17* 20*  --  23  GLUCOSE 299*   < > 156* 207* 211* 120*  --  137*  BUN 32*    < > 33* 32* 35* 23*  --  12  CREATININE 1.13*   < > 0.98 0.83 1.01* 0.68  --  0.59  CALCIUM 8.4*   < > 8.7* 8.4* 8.3* 8.2*  --  7.9*  MG 2.1  --  2.2  --   --   --   --   --   PHOS 1.8*  --  2.8  --   --   --   --   --    < > = values in this interval not displayed.   Liver Function Tests: Recent Labs  Lab 01/04/18 0118  AST 12*  ALT 20  ALKPHOS 83  BILITOT 0.8  PROT 6.4*  ALBUMIN 3.3*   No results for input(s): LIPASE, AMYLASE in the last 168 hours. No results for input(s): AMMONIA in the last 168 hours. CBC: Recent Labs  Lab 01/03/18 1245 01/03/18 1449 01/04/18 0825 01/05/18 0347 01/06/18 0548  WBC 18.9*  --  13.4* 11.1* 7.5  NEUTROABS 15.8*  --  11.7*  --  4.1  HGB 15.3* 16.3* 13.3 12.5 13.0  HCT 45.2 48.0* 39.7 36.8 37.9  MCV 87.3  --  86.7 86.2 85.7  PLT 281  --  247 212 203   Cardiac Enzymes: Recent Labs  Lab 01/03/18 2218 01/04/18 0118 01/04/18 0825  TROPONINI 0.05* 0.05* 0.04*   BNP: BNP (last 3 results) No results for input(s): BNP in the last 8760 hours.  ProBNP (last 3 results) No results for input(s): PROBNP in the last 8760 hours.  CBG: Recent Labs  Lab 01/05/18 2041 01/05/18 2339 01/06/18 0401 01/06/18 0734 01/06/18 1136  GLUCAP 215* 174* 128* 118* 220*       Signed:  Irine Seal MD.  [facility] 01/06/2018, 3:02 PM

## 2018-01-07 LAB — GLUCOSE, CAPILLARY: Glucose-Capillary: 227 mg/dL — ABNORMAL HIGH (ref 70–99)

## 2018-04-05 ENCOUNTER — Encounter (HOSPITAL_COMMUNITY): Payer: Self-pay | Admitting: *Deleted

## 2018-04-05 ENCOUNTER — Emergency Department (HOSPITAL_COMMUNITY)
Admission: EM | Admit: 2018-04-05 | Discharge: 2018-04-05 | Disposition: A | Discharge status: Home / Self Care | Payer: Medicaid Other | Attending: Emergency Medicine | Admitting: Emergency Medicine

## 2018-04-05 ENCOUNTER — Other Ambulatory Visit: Payer: Self-pay

## 2018-04-05 DIAGNOSIS — Z5321 Procedure and treatment not carried out due to patient leaving prior to being seen by health care provider: Secondary | ICD-10-CM | POA: Diagnosis not present

## 2018-04-05 DIAGNOSIS — R45851 Suicidal ideations: Secondary | ICD-10-CM | POA: Diagnosis not present

## 2018-04-05 DIAGNOSIS — F319 Bipolar disorder, unspecified: Secondary | ICD-10-CM | POA: Insufficient documentation

## 2018-04-05 LAB — COMPREHENSIVE METABOLIC PANEL WITH GFR
ALT: 19 U/L (ref 0–44)
AST: 19 U/L (ref 15–41)
Albumin: 4.4 g/dL (ref 3.5–5.0)
Alkaline Phosphatase: 71 U/L (ref 38–126)
Anion gap: 10 (ref 5–15)
BUN: 9 mg/dL (ref 6–20)
CO2: 21 mmol/L — ABNORMAL LOW (ref 22–32)
Calcium: 9.5 mg/dL (ref 8.9–10.3)
Chloride: 109 mmol/L (ref 98–111)
Creatinine, Ser: 0.87 mg/dL (ref 0.44–1.00)
GFR calc Af Amer: >60 mL/min
GFR calc non Af Amer: >60 mL/min
Glucose, Bld: 156 mg/dL — ABNORMAL HIGH (ref 70–99)
Potassium: 4.1 mmol/L (ref 3.5–5.1)
Sodium: 140 mmol/L (ref 135–145)
Total Bilirubin: 0.3 mg/dL (ref 0.3–1.2)
Total Protein: 7.4 g/dL (ref 6.5–8.1)

## 2018-04-05 LAB — CBC
HCT: 42.4 % (ref 36.0–46.0)
Hemoglobin: 13.8 g/dL (ref 12.0–15.0)
MCH: 29 pg (ref 26.0–34.0)
MCHC: 32.5 g/dL (ref 30.0–36.0)
MCV: 89.1 fL (ref 80.0–100.0)
Platelets: 260 K/uL (ref 150–400)
RBC: 4.76 MIL/uL (ref 3.87–5.11)
RDW: 13.8 % (ref 11.5–15.5)
WBC: 8 K/uL (ref 4.0–10.5)
nRBC: 0 % (ref 0.0–0.2)

## 2018-04-05 LAB — I-STAT BETA HCG BLOOD, ED (MC, WL, AP ONLY): I-stat hCG, quantitative: <5 m[IU]/mL

## 2018-04-05 LAB — RAPID URINE DRUG SCREEN, HOSP PERFORMED
Amphetamines: NOT DETECTED
Barbiturates: NOT DETECTED
Benzodiazepines: POSITIVE — AB
Cocaine: NOT DETECTED
Opiates: NOT DETECTED
Tetrahydrocannabinol: NOT DETECTED

## 2018-04-05 LAB — SALICYLATE LEVEL: Salicylate Lvl: <7 mg/dL (ref 2.8–30.0)

## 2018-04-05 LAB — ACETAMINOPHEN LEVEL: Acetaminophen (Tylenol), Serum: <10 ug/mL — ABNORMAL LOW (ref 10–30)

## 2018-04-05 LAB — ETHANOL: Alcohol, Ethyl (B): <10 mg/dL

## 2018-04-05 NOTE — ED Triage Notes (Signed)
Per EMS pt from home with c/o SI, EMS sts pt is manic, crying and laughing, hx of bipolar disorder, off of her meds for a while. Per EMS ot is supposed to be taking levemir 20 units, but she is taking 15 units to make it last longer.

## 2018-04-05 NOTE — ED Notes (Signed)
Bed: [facility]PT4 Expected date:  Expected time:  Means of arrival:  Comments: 

## 2018-04-05 NOTE — ED Notes (Signed)
Dr Freida Busman looking for pt, pt not in room or found in department,

## 2018-04-05 NOTE — ED Notes (Signed)
Pt removed from system since she did not return

## 2018-06-08 ENCOUNTER — Encounter (HOSPITAL_COMMUNITY): Payer: Self-pay | Admitting: Emergency Medicine

## 2018-06-08 ENCOUNTER — Emergency Department (HOSPITAL_COMMUNITY)
Admission: EM | Admit: 2018-06-08 | Discharge: 2018-06-09 | Disposition: A | Discharge status: Other Acute Inpatient Hospital | Payer: Medicaid Other | Attending: Emergency Medicine | Admitting: Emergency Medicine

## 2018-06-08 DIAGNOSIS — F312 Bipolar disorder, current episode manic severe with psychotic features: Secondary | ICD-10-CM | POA: Insufficient documentation

## 2018-06-08 DIAGNOSIS — F3010 Manic episode without psychotic symptoms, unspecified: Secondary | ICD-10-CM

## 2018-06-08 DIAGNOSIS — Z79899 Other long term (current) drug therapy: Secondary | ICD-10-CM | POA: Insufficient documentation

## 2018-06-08 DIAGNOSIS — Z794 Long term (current) use of insulin: Secondary | ICD-10-CM | POA: Insufficient documentation

## 2018-06-08 DIAGNOSIS — F3132 Bipolar disorder, current episode depressed, moderate: Secondary | ICD-10-CM

## 2018-06-08 DIAGNOSIS — F17210 Nicotine dependence, cigarettes, uncomplicated: Secondary | ICD-10-CM | POA: Insufficient documentation

## 2018-06-08 DIAGNOSIS — E119 Type 2 diabetes mellitus without complications: Secondary | ICD-10-CM | POA: Insufficient documentation

## 2018-06-08 LAB — COMPREHENSIVE METABOLIC PANEL
ALK PHOS: 66 U/L (ref 38–126)
ALT: 27 U/L (ref 0–44)
AST: 32 U/L (ref 15–41)
Albumin: 4.5 g/dL (ref 3.5–5.0)
Anion gap: 12 (ref 5–15)
BILIRUBIN TOTAL: 0.7 mg/dL (ref 0.3–1.2)
BUN: 22 mg/dL — ABNORMAL HIGH (ref 6–20)
CO2: 25 mmol/L (ref 22–32)
Calcium: 10.2 mg/dL (ref 8.9–10.3)
Chloride: 107 mmol/L (ref 98–111)
Creatinine, Ser: 1.22 mg/dL — ABNORMAL HIGH (ref 0.44–1.00)
GFR calc Af Amer: >60 mL/min (ref >60)
GFR calc non Af Amer: 52 mL/min — ABNORMAL LOW (ref >60)
Glucose, Bld: 207 mg/dL — ABNORMAL HIGH (ref 70–99)
Potassium: 4 mmol/L (ref 3.5–5.1)
Sodium: 144 mmol/L (ref 135–145)
TOTAL PROTEIN: 7.3 g/dL (ref 6.5–8.1)

## 2018-06-08 LAB — CBG MONITORING, ED
Glucose-Capillary: 200 mg/dL — ABNORMAL HIGH (ref 70–99)
Glucose-Capillary: 120 mg/dL — ABNORMAL HIGH (ref 70–99)

## 2018-06-08 LAB — CBC WITH DIFFERENTIAL/PLATELET
Abs Immature Granulocytes: 0.02 10*3/uL (ref 0.00–0.07)
Basophils Absolute: 0.1 10*3/uL (ref 0.0–0.1)
Basophils Relative: 1 %
Eosinophils Absolute: 0.3 10*3/uL (ref 0.0–0.5)
Eosinophils Relative: 3 %
HCT: 39.6 % (ref 36.0–46.0)
Hemoglobin: 13 g/dL (ref 12.0–15.0)
Immature Granulocytes: 0 %
Lymphocytes Relative: 29 %
Lymphs Abs: 2.7 10*3/uL (ref 0.7–4.0)
MCH: 29.7 pg (ref 26.0–34.0)
MCHC: 32.8 g/dL (ref 30.0–36.0)
MCV: 90.4 fL (ref 80.0–100.0)
Monocytes Absolute: 0.8 10*3/uL (ref 0.1–1.0)
Monocytes Relative: 9 %
NRBC: 0 % (ref 0.0–0.2)
Neutro Abs: 5.4 10*3/uL (ref 1.7–7.7)
Neutrophils Relative %: 58 %
Platelets: 269 10*3/uL (ref 150–400)
RBC: 4.38 MIL/uL (ref 3.87–5.11)
RDW: 13.1 % (ref 11.5–15.5)
WBC: 9.2 10*3/uL (ref 4.0–10.5)

## 2018-06-08 LAB — I-STAT BETA HCG BLOOD, ED (MC, WL, AP ONLY): I-stat hCG, quantitative: <5 m[IU]/mL (ref <5)

## 2018-06-08 LAB — ETHANOL

## 2018-06-08 MED ORDER — HALOPERIDOL LACTATE 5 MG/ML IJ SOLN
5.0000 mg | Freq: Once | INTRAMUSCULAR | Status: AC
  Administered 2018-06-08: 5 mg via INTRAMUSCULAR
  Filled 2018-06-08: qty 1

## 2018-06-08 MED ORDER — ZIPRASIDONE MESYLATE 20 MG IM SOLR: 10.0000 mg | Freq: Once | INTRAMUSCULAR | Status: DC | PRN

## 2018-06-08 MED ORDER — OLANZAPINE 10 MG PO TABS
10.0000 mg | ORAL_TABLET | Freq: Every day | ORAL | Status: DC
  Administered 2018-06-08: 10 mg via ORAL
  Filled 2018-06-08: qty 1

## 2018-06-08 MED ORDER — ADULT MULTIVITAMIN W/MINERALS CH
1.0000 | ORAL_TABLET | Freq: Every day | ORAL | Status: DC
  Administered 2018-06-09: 1 via ORAL
  Filled 2018-06-08: qty 1

## 2018-06-08 MED ORDER — INSULIN DETEMIR 100 UNIT/ML ~~LOC~~ SOLN
15.0000 [IU] | Freq: Every day | SUBCUTANEOUS | Status: DC
  Administered 2018-06-09: 15 [IU] via SUBCUTANEOUS
  Filled 2018-06-08: qty 0.15

## 2018-06-08 MED ORDER — INSULIN DETEMIR 100 UNIT/ML ~~LOC~~ SOLN
10.0000 [IU] | Freq: Once | SUBCUTANEOUS | Status: AC
  Administered 2018-06-08: 10 [IU] via SUBCUTANEOUS
  Filled 2018-06-08: qty 0.1

## 2018-06-08 MED ORDER — LISINOPRIL 10 MG PO TABS
10.0000 mg | ORAL_TABLET | Freq: Every day | ORAL | Status: DC
  Administered 2018-06-08 – 2018-06-09 (×2): 10 mg via ORAL
  Filled 2018-06-08 (×2): qty 1

## 2018-06-08 NOTE — BH Assessment (Signed)
[facility] Assessment Progress Note Case was staffed with Lord DNP who recommended a inpatient admission to assist with stabilization.       

## 2018-06-08 NOTE — Progress Notes (Signed)
Received [name] [name] this PM in her bed asleep, later she got up to use the bathroom. She was compliant with her BS check, 120, received a snack and compliant with her medications and insulin. She remains disorganized, but cooperative. She denied all of the psychiatric symptoms including feeling suicidal. She slept well most of the night except for approximately 30 min, 0330 to 0400 hrs.

## 2018-06-08 NOTE — ED Triage Notes (Signed)
Pt arrived by [employer] after being called by son stating she was trying to kill herself by leaving stove on.  Pt is very manic with pressured speech, flight of ideas, tangential.  Pt has writing all over her body with sharpies.  She also has scratches and bruises all over her feet.

## 2018-06-08 NOTE — ED Notes (Signed)
Pt refused lab draw and yelled and screamed while security held her still for blood draw.  Pt was cursing and screaming .  Lab draw was succesful on first try.
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
## 2018-06-08 NOTE — ED Provider Notes (Signed)
[place] DEPT Provider Note   CSN: [id number] Arrival date & time: 06/08/18  1448     History   Chief Complaint Chief Complaint  Patient presents with  . Medical Clearance    HPI [name] [name] is a 47 y.o. female presenting for psychiatric evaluation.   Level 5 caveat, patient with psychiatric disorder, is manic.   Patient states she thinks 1 of her family members called her to go before [employer].  She is not sure why.  She states she was on medication for her mental health, but has not been on it for several months.  She states she is not sleeping much because of her chronic pain.  Patient is unable to give any further history.  Denying medical problems.  Thoughts are tangential and sentences are rambling.  Pt states that she was unable to sit on the toilet because it would "yo yo" her back up.   HPI  Past Medical History:  Diagnosis Date  . Anxiety disorder   . Asthma   . Bipolar affective disorder ([place])   . Depression   . Diabetes mellitus   . Migraine   . Schizophrenia [facility])     Patient Active Problem List   Diagnosis Date Noted  . DKA, type 2 ([place]) 01/06/2018  . Diabetic acidosis without coma ([place])   . Adjustment disorder with depressed mood 01/04/2018  . Oral thrush   . Acute lower UTI   . DKA, type 2, not at goal [facility]) 01/03/2018  . Thrush 01/03/2018  . DM (diabetes mellitus), type 2, uncontrolled ([place]) 06/07/2017  . Abscess of groin, right 06/07/2017  . Abscess 06/07/2017  . Bipolar disorder with moderate depression ([place]) 08/31/2015  . Tobacco use disorder 08/28/2015  . HTN (hypertension) 08/28/2015  . Diabetes ([place]) 08/28/2015  . Hyponatremia 08/28/2015  . DM 10/26/2009  . OBESITY 10/26/2009  . Essential hypertension 10/26/2009  . DISTURBANCE OF SKIN SENSATION 10/26/2009  . Migraine headache 03/05/2007    Past Surgical History:  Procedure Laterality Date  . KNEE ARTHROSCOPY     x 2  . KNEE SURGERY Left    Incision made and knee cleaned out  . TUBAL LIGATION       OB History   No obstetric history on file.      Home Medications    Prior to Admission medications   Medication Sig Start Date End Date Taking? Authorizing Provider  acetaminophen (TYLENOL) 500 MG tablet Take 1,000 mg by mouth every 6 (six) hours as needed for mild pain or headache.    [provider]  albuterol (PROVENTIL HFA;VENTOLIN HFA) 108 (90 Base) MCG/ACT inhaler Inhale 2 puffs into the lungs every 6 (six) hours as needed for wheezing or shortness of breath. 01/06/18   Eugenie Filler, MD  blood glucose meter kit and supplies Dispense based on patient and insurance preference. Use up to four times daily as directed. (FOR ICD-10 E10.9, E11.9). 01/06/18   Eugenie Filler, MD  Cyanocobalamin (VITAMIN B-12 ER PO) Take 1 tablet by mouth 2 (two) times a week.    [provider]  diphenhydrAMINE (BENADRYL) 25 MG tablet Take 25 mg by mouth 2 (two) times daily.    [provider]  Erenumab-aooe (AIMOVIG, 140 MG DOSE,) 70 MG/ML SOAJ Inject 70 mg into the skin every 30 (thirty) days.    [provider]  Exenatide ER (BYDUREON BCISE) 2 MG/0.85ML AUIJ Inject 2 mg into the skin every Friday.  [provider]  FLUoxetine (PROZAC) 10 MG capsule Take 1 capsule (10 mg total) by mouth daily. Patient not taking: Reported on 04/05/2018 01/07/18   Eugenie Filler, MD  Fluticasone-Salmeterol (ADVAIR) 250-50 MCG/DOSE AEPB Inhale 1 puff into the lungs 2 (two) times daily.    [provider]  hydroxypropyl methylcellulose / hypromellose (ISOPTO TEARS / GONIOVISC) 2.5 % ophthalmic solution Place 1 drop into both eyes 3 (three) times daily as needed for dry eyes.    [provider]  ibuprofen (ADVIL) 200 MG tablet Take 2 tablets (400 mg total) by mouth every 6 (six) hours as needed for headache. 01/06/18   Eugenie Filler, MD  insulin aspart (NOVOLOG FLEXPEN) 100 UNIT/ML FlexPen  Inject 3 Units into the skin 3 (three) times daily with meals. Patient not taking: Reported on 04/05/2018 01/06/18   Eugenie Filler, MD  Insulin Detemir (LEVEMIR FLEXTOUCH) 100 UNIT/ML Pen Inject 15 Units into the skin daily. 01/06/18   Eugenie Filler, MD  Insulin Pen Needle (PEN NEEDLES 3/16") 31G X 5 MM MISC 15 Units by Does not apply route daily. 01/06/18   Eugenie Filler, MD  lisinopril (PRINIVIL,ZESTRIL) 10 MG tablet Take 1 tablet (10 mg total) by mouth daily. Patient not taking: Reported on 04/05/2018 01/06/18   Eugenie Filler, MD  Multiple Vitamin (MULTIVITAMIN WITH MINERALS) TABS tablet Take 1 tablet by mouth daily.    [provider]  promethazine (PHENERGAN) 25 MG tablet Take 1 tablet (25 mg total) by mouth every 6 (six) hours as needed for nausea or vomiting. 01/06/18   Eugenie Filler, MD    Family History Family History  Problem Relation Age of Onset  . Bipolar disorder Mother   . Hypertension Father   . Diabetes Father     Social History Social History   Tobacco Use  . Smoking status: Current Every Day Smoker    Packs/day: 1.00    Years: 29.00    Pack years: 29.00    Types: Cigarettes  . Smokeless tobacco: Never Used  Substance Use Topics  . Alcohol use: Not Currently    Comment: occasionally  . Drug use: No     Allergies   Tramadol and Nicotine   Review of Systems Review of Systems  Unable to perform ROS: Psychiatric disorder     Physical Exam Updated Vital Signs BP 112/84 (BP Location: Left Arm)   Pulse (!) 106   Temp 99.1 F (37.3 C) (Oral)   Resp 16   Ht _0  (1.676 m)   Wt 76.7 kg   LMP  (LMP Unknown) Comment: Pt is poor historian  SpO2 95%   BMI 27.28 kg/m   Physical Exam Vitals signs and nursing note reviewed.  Constitutional:      General: She is not in acute distress.    Appearance: She is well-developed.     Comments: Appears nontoxic  HENT:     Head: Normocephalic and atraumatic.  Eyes:      Conjunctiva/sclera: Conjunctivae normal.     Pupils: Pupils are equal, round, and reactive to light.  Neck:     Musculoskeletal: Normal range of motion and neck supple.  Cardiovascular:     Rate and Rhythm: Normal rate and regular rhythm.     Pulses: Normal pulses.  Pulmonary:     Effort: Pulmonary effort is normal. No respiratory distress.     Breath sounds: Normal breath sounds. No wheezing.  Abdominal:     General: There  is no distension.  Musculoskeletal: Normal range of motion.  Skin:    General: Skin is warm and dry.     Capillary Refill: Capillary refill takes less than 2 seconds.     Comments: Scrapes on feet without signs of infection or active bleeding. Pt has drawings all over her skin from marker  Neurological:     Mental Status: She is alert and oriented to person, place, and time.  Psychiatric:        Speech: Speech is rapid and pressured and tangential.        Behavior: Behavior is hyperactive.      ED Treatments / Results  Labs (all labs ordered are listed, but only abnormal results are displayed) Labs Reviewed  COMPREHENSIVE METABOLIC PANEL - Abnormal; Notable for the following components:      Result Value   Glucose, Bld 207 (*)    BUN 22 (*)    Creatinine, Ser 1.22 (*)    GFR calc non Af Amer 52 (*)    All other components within normal limits  CBG MONITORING, ED - Abnormal; Notable for the following components:   Glucose-Capillary 200 (*)    All other components within normal limits  ETHANOL  CBC WITH DIFFERENTIAL/PLATELET  RAPID URINE DRUG SCREEN, HOSP PERFORMED  I-STAT BETA HCG BLOOD, ED ([facility], [facility], [facility] ONLY)    EKG None  Radiology No results found.  Procedures Procedures (including critical care time)  Medications Ordered in ED Medications  insulin detemir (LEVEMIR) injection 15 Units (has no administration in time range)  multivitamin with minerals tablet 1 tablet (has no administration in time range)  lisinopril (PRINIVIL,ZESTRIL) tablet  10 mg (10 mg Oral Given 06/08/18 1842)  OLANZapine (ZYPREXA) tablet 10 mg (has no administration in time range)  insulin detemir (LEVEMIR) injection 10 Units (has no administration in time range)  haloperidol lactate (HALDOL) injection 5 mg (5 mg Intramuscular Given 06/08/18 1659)     Initial Impression / Assessment and Plan / ED Course  I have reviewed the triage vital signs and the nursing notes.  Pertinent labs & imaging results that were available during my care of the patient were reviewed by me and considered in my medical decision making (see chart for details).     Patient presenting under IVC for psychiatric evaluation.  On exam, patient is manic with pressured speech.  Appears hyperactive.  Thoughts are tangential.  Patient has been off of her medication for the past 6 months.  Heart rate elevated, though I believe this is likely related to her manic state.  Will obtain labs, EKG, and reassess.  SCr mildly elevated from baseline. Pt likely mildly dehydrated. Will encourage fluids.   On reevaluation HR improved to 106. Pt medically cleared for TTS.   Per [facility] team, pt recommended for inpatient tx.   Final Clinical Impressions(s) / ED Diagnoses   Final diagnoses:  Manic behavior [facility])    ED Discharge Orders    None       Franchot Heidelberg, PA-C 06/08/18 2038    Orlie Dakin, MD 06/09/18 [phone]

## 2018-06-09 ENCOUNTER — Encounter (HOSPITAL_COMMUNITY): Payer: Self-pay | Admitting: *Deleted

## 2018-06-09 ENCOUNTER — Other Ambulatory Visit: Payer: Self-pay

## 2018-06-09 ENCOUNTER — Inpatient Hospital Stay (HOSPITAL_COMMUNITY)
Admission: AD | Admit: 2018-06-09 | Discharge: 2018-06-15 | DRG: 885 | Disposition: A | Discharge status: Home / Self Care | Payer: Medicaid Other | Attending: Psychiatry | Admitting: Psychiatry

## 2018-06-09 DIAGNOSIS — F319 Bipolar disorder, unspecified: Secondary | ICD-10-CM | POA: Diagnosis present

## 2018-06-09 DIAGNOSIS — Z765 Malingerer [conscious simulation]: Secondary | ICD-10-CM

## 2018-06-09 DIAGNOSIS — F3110 Bipolar disorder, current episode manic without psychotic features, unspecified: Secondary | ICD-10-CM | POA: Diagnosis not present

## 2018-06-09 DIAGNOSIS — Z91048 Other nonmedicinal substance allergy status: Secondary | ICD-10-CM

## 2018-06-09 DIAGNOSIS — Z9114 Patient's other noncompliance with medication regimen: Secondary | ICD-10-CM

## 2018-06-09 DIAGNOSIS — Z818 Family history of other mental and behavioral disorders: Secondary | ICD-10-CM

## 2018-06-09 DIAGNOSIS — Z794 Long term (current) use of insulin: Secondary | ICD-10-CM

## 2018-06-09 DIAGNOSIS — J45909 Unspecified asthma, uncomplicated: Secondary | ICD-10-CM | POA: Diagnosis present

## 2018-06-09 DIAGNOSIS — F302 Manic episode, severe with psychotic symptoms: Secondary | ICD-10-CM | POA: Diagnosis not present

## 2018-06-09 DIAGNOSIS — G43909 Migraine, unspecified, not intractable, without status migrainosus: Secondary | ICD-10-CM | POA: Diagnosis not present

## 2018-06-09 DIAGNOSIS — Z7951 Long term (current) use of inhaled steroids: Secondary | ICD-10-CM

## 2018-06-09 DIAGNOSIS — E119 Type 2 diabetes mellitus without complications: Secondary | ICD-10-CM | POA: Diagnosis present

## 2018-06-09 DIAGNOSIS — Z9119 Patient's noncompliance with other medical treatment and regimen: Secondary | ICD-10-CM

## 2018-06-09 DIAGNOSIS — Z885 Allergy status to narcotic agent status: Secondary | ICD-10-CM | POA: Diagnosis not present

## 2018-06-09 DIAGNOSIS — I10 Essential (primary) hypertension: Secondary | ICD-10-CM | POA: Diagnosis present

## 2018-06-09 DIAGNOSIS — Z833 Family history of diabetes mellitus: Secondary | ICD-10-CM | POA: Diagnosis not present

## 2018-06-09 DIAGNOSIS — R45851 Suicidal ideations: Secondary | ICD-10-CM | POA: Diagnosis present

## 2018-06-09 DIAGNOSIS — Z8249 Family history of ischemic heart disease and other diseases of the circulatory system: Secondary | ICD-10-CM | POA: Diagnosis not present

## 2018-06-09 DIAGNOSIS — F17210 Nicotine dependence, cigarettes, uncomplicated: Secondary | ICD-10-CM | POA: Diagnosis present

## 2018-06-09 DIAGNOSIS — F419 Anxiety disorder, unspecified: Secondary | ICD-10-CM | POA: Diagnosis present

## 2018-06-09 LAB — CBG MONITORING, ED
Glucose-Capillary: 210 mg/dL — ABNORMAL HIGH (ref 70–99)
Glucose-Capillary: 121 mg/dL — ABNORMAL HIGH (ref 70–99)

## 2018-06-09 LAB — RAPID URINE DRUG SCREEN, HOSP PERFORMED
Amphetamines: NOT DETECTED
Barbiturates: NOT DETECTED
Benzodiazepines: NOT DETECTED
Cocaine: NOT DETECTED
Opiates: NOT DETECTED
Tetrahydrocannabinol: NOT DETECTED

## 2018-06-09 LAB — GLUCOSE, CAPILLARY
Glucose-Capillary: 174 mg/dL — ABNORMAL HIGH (ref 70–99)
Glucose-Capillary: 343 mg/dL — ABNORMAL HIGH (ref 70–99)

## 2018-06-09 MED ORDER — HYDROXYZINE HCL 50 MG PO TABS
50.0000 mg | ORAL_TABLET | Freq: Three times a day (TID) | ORAL | Status: DC | PRN
  Administered 2018-06-09 – 2018-06-13 (×4): 50 mg via ORAL
  Filled 2018-06-09 (×4): qty 1

## 2018-06-09 MED ORDER — MOMETASONE FURO-FORMOTEROL FUM 200-5 MCG/ACT IN AERO
2.0000 | INHALATION_SPRAY | Freq: Two times a day (BID) | RESPIRATORY_TRACT | Status: DC
  Administered 2018-06-09 – 2018-06-15 (×10): 2 via RESPIRATORY_TRACT
  Filled 2018-06-09: qty 8.8

## 2018-06-09 MED ORDER — MAGNESIUM HYDROXIDE 400 MG/5ML PO SUSP: 30.0000 mL | Freq: Every day | ORAL | Status: DC | PRN

## 2018-06-09 MED ORDER — LISINOPRIL 10 MG PO TABS: 10.0000 mg | ORAL_TABLET | Freq: Every day | ORAL | Status: DC

## 2018-06-09 MED ORDER — INSULIN ASPART 100 UNIT/ML ~~LOC~~ SOLN
4.0000 [IU] | Freq: Three times a day (TID) | SUBCUTANEOUS | Status: DC
  Administered 2018-06-09 – 2018-06-11 (×5): 4 [IU] via SUBCUTANEOUS

## 2018-06-09 MED ORDER — HALOPERIDOL 5 MG PO TABS
5.0000 mg | ORAL_TABLET | Freq: Four times a day (QID) | ORAL | Status: DC | PRN
  Administered 2018-06-09 – 2018-06-12 (×3): 5 mg via ORAL
  Filled 2018-06-09 (×2): qty 1

## 2018-06-09 MED ORDER — ALUM & MAG HYDROXIDE-SIMETH 200-200-20 MG/5ML PO SUSP
30.0000 mL | ORAL | Status: DC | PRN
  Administered 2018-06-14: 30 mL via ORAL
  Filled 2018-06-09: qty 30

## 2018-06-09 MED ORDER — OLANZAPINE 10 MG PO TABS
10.0000 mg | ORAL_TABLET | Freq: Every day | ORAL | Status: DC
  Administered 2018-06-09 – 2018-06-10 (×2): 10 mg via ORAL
  Filled 2018-06-09 (×4): qty 1

## 2018-06-09 MED ORDER — IBUPROFEN 200 MG PO TABS
600.0000 mg | ORAL_TABLET | Freq: Once | ORAL | Status: AC
  Administered 2018-06-09: 600 mg via ORAL
  Filled 2018-06-09: qty 3

## 2018-06-09 MED ORDER — MOMETASONE FURO-FORMOTEROL FUM 200-5 MCG/ACT IN AERO
2.0000 | INHALATION_SPRAY | Freq: Two times a day (BID) | RESPIRATORY_TRACT | Status: DC
  Filled 2018-06-09: qty 8.8

## 2018-06-09 MED ORDER — INSULIN DETEMIR 100 UNIT/ML ~~LOC~~ SOLN
15.0000 [IU] | Freq: Every day | SUBCUTANEOUS | Status: DC
  Administered 2018-06-10 – 2018-06-14 (×5): 15 [IU] via SUBCUTANEOUS

## 2018-06-09 MED ORDER — ALBUTEROL SULFATE HFA 108 (90 BASE) MCG/ACT IN AERS: 2.0000 | INHALATION_SPRAY | Freq: Four times a day (QID) | RESPIRATORY_TRACT | Status: DC | PRN

## 2018-06-09 MED ORDER — ALBUTEROL SULFATE HFA 108 (90 BASE) MCG/ACT IN AERS: 2.0000 | INHALATION_SPRAY | RESPIRATORY_TRACT | Status: DC | PRN

## 2018-06-09 MED ORDER — ACETAMINOPHEN 325 MG PO TABS
650.0000 mg | ORAL_TABLET | Freq: Four times a day (QID) | ORAL | Status: DC | PRN
  Administered 2018-06-09 – 2018-06-15 (×13): 650 mg via ORAL
  Filled 2018-06-09 (×13): qty 2

## 2018-06-09 MED ORDER — ALUM & MAG HYDROXIDE-SIMETH 200-200-20 MG/5ML PO SUSP: 30.0000 mL | ORAL | Status: DC | PRN

## 2018-06-09 MED ORDER — NICOTINE POLACRILEX 2 MG MT GUM
2.0000 mg | CHEWING_GUM | OROMUCOSAL | Status: DC | PRN
  Administered 2018-06-09 – 2018-06-15 (×17): 2 mg via ORAL
  Filled 2018-06-09: qty 1
  Filled 2018-06-09: qty 10
  Filled 2018-06-09: qty 9
  Filled 2018-06-09: qty 10

## 2018-06-09 MED ORDER — LISINOPRIL 10 MG PO TABS
10.0000 mg | ORAL_TABLET | Freq: Every day | ORAL | Status: DC
  Administered 2018-06-10 – 2018-06-15 (×6): 10 mg via ORAL
  Filled 2018-06-09 (×8): qty 1

## 2018-06-09 MED ORDER — INSULIN DETEMIR 100 UNIT/ML ~~LOC~~ SOLN
15.0000 [IU] | Freq: Every day | SUBCUTANEOUS | Status: DC
  Administered 2018-06-09: 15 [IU] via SUBCUTANEOUS

## 2018-06-09 MED ORDER — ADULT MULTIVITAMIN W/MINERALS CH
1.0000 | ORAL_TABLET | Freq: Every day | ORAL | Status: DC
  Administered 2018-06-10 – 2018-06-15 (×6): 1 via ORAL
  Filled 2018-06-09 (×9): qty 1

## 2018-06-09 MED ORDER — RISPERIDONE 2 MG PO TABS
2.0000 mg | ORAL_TABLET | Freq: Two times a day (BID) | ORAL | Status: DC
  Administered 2018-06-09 – 2018-06-10 (×2): 2 mg via ORAL
  Filled 2018-06-09 (×5): qty 1

## 2018-06-09 MED ORDER — HALOPERIDOL LACTATE 5 MG/ML IJ SOLN: 5.0000 mg | Freq: Four times a day (QID) | INTRAMUSCULAR | Status: DC | PRN

## 2018-06-09 MED ORDER — FLUOXETINE HCL 10 MG PO CAPS
10.0000 mg | ORAL_CAPSULE | Freq: Every day | ORAL | Status: DC
  Filled 2018-06-09 (×3): qty 1

## 2018-06-09 MED ORDER — TRAZODONE HCL 100 MG PO TABS
200.0000 mg | ORAL_TABLET | Freq: Every day | ORAL | Status: DC
  Administered 2018-06-09 – 2018-06-14 (×6): 200 mg via ORAL
  Filled 2018-06-09 (×8): qty 2

## 2018-06-09 MED ORDER — LISINOPRIL 10 MG PO TABS
10.0000 mg | ORAL_TABLET | Freq: Every day | ORAL | Status: DC
  Administered 2018-06-09: 10 mg via ORAL
  Filled 2018-06-09 (×2): qty 1
  Filled 2018-06-09: qty 2

## 2018-06-09 MED ORDER — HALOPERIDOL LACTATE 5 MG/ML IJ SOLN
5.0000 mg | Freq: Once | INTRAMUSCULAR | Status: DC
  Filled 2018-06-09: qty 1

## 2018-06-09 MED ORDER — ACETAMINOPHEN 325 MG PO TABS: 650.0000 mg | ORAL_TABLET | Freq: Four times a day (QID) | ORAL | Status: DC | PRN
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
## 2018-06-09 NOTE — ED Notes (Signed)
Report given to [facility] at [facility]. [employer] called for transport.

## 2018-06-09 NOTE — ED Notes (Signed)
Attempted to call report. No answer on [address]. 
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
## 2018-06-09 NOTE — BHH Suicide Risk Assessment (Signed)
[facility] Admission Suicide Risk Assessment   Nursing information obtained from:    Demographic factors:    Current Mental Status:    Loss Factors:    Historical Factors:    Risk Reduction Factors:     Total Time spent with patient: 45 minutes Principal Problem: Disorganized thought and behavior general dangerousness Diagnosis:  Active Problems:   Bipolar I disorder, single manic episode, severe, with psychosis ([facility])  Subjective Data: Presents in a disorganized fashion due to exacerbation and underlying bipolar disorder  Continued Clinical Symptoms:    The "Alcohol Use Disorders Identification Test", Guidelines for Use in Primary Care, Second Edition.  World Science writer [facility]). Score between 0-7:  no or low risk or alcohol related problems. Score between 8-15:  moderate risk of alcohol related problems. Score between 16-19:  high risk of alcohol related problems. Score 20 or above:  warrants further diagnostic evaluation for alcohol dependence and treatment.   CLINICAL FACTORS:   Bipolar Disorder:   Mixed State       COGNITIVE FEATURES THAT CONTRIBUTE TO RISK:  None    SUICIDE RISK:   Minimal: No identifiable suicidal ideation.  Patients presenting with no risk factors but with morbid ruminations; may be classified as minimal risk based on the severity of the depressive symptoms  PLAN OF CARE: Admitted for stabilization  I certify that inpatient services furnished can reasonably be expected to improve the patient's condition.   Malvin Johns, MD 06/09/2018, 2:23 PM
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
## 2018-06-09 NOTE — Tx Team (Signed)
Initial Treatment Plan 06/09/2018 5:43 PM [name] [name] ZOX:[id number]    PATIENT STRESSORS: Marital or family conflict Medication change or noncompliance   PATIENT STRENGTHS: General fund of knowledge Supportive family/friends   PATIENT IDENTIFIED PROBLEMS: bhh admission  Increased risk for suicide  Ineffective coping skills                 DISCHARGE CRITERIA:  Improved stabilization in mood, thinking, and/or behavior Need for constant or close observation no longer present Reduction of life-threatening or endangering symptoms to within safe limits  PRELIMINARY DISCHARGE PLAN: Outpatient therapy Return to previous living arrangement  PATIENT/FAMILY INVOLVEMENT: This treatment plan has been presented to and reviewed with the patient, [name] [name], and/or family member, [name] [name].  The patient and family have been given the opportunity to ask questions and make suggestions.  Harvel Quale, LPN 81/19/1478, 5:43 PM

## 2018-06-09 NOTE — BH Assessment (Signed)
BHH Assessment Progress Note  Per Cristino Martes, MD, this pt requires psychiatric hospitalization.  Berneice Heinrich, RN, [facility] has assigned pt to [facility] Rm [id number]; [facility] will be ready to receive pt any time before 12:00.  Pt presents under IVC initiated by pt's son, and upheld by EDP Azalia Bilis, MD, and IVC documents have been faxed to [facility].  Pt's nurse, Diane, has been notified, and agrees to call report to [phone].  Pt is to be transported via [employer].   Doylene Canning, [place] [facility]al Health Coordinator [phone]

## 2018-06-10 LAB — GLUCOSE, CAPILLARY
Glucose-Capillary: 93 mg/dL (ref 70–99)
Glucose-Capillary: 204 mg/dL — ABNORMAL HIGH (ref 70–99)
Glucose-Capillary: 212 mg/dL — ABNORMAL HIGH (ref 70–99)

## 2018-06-10 LAB — HEMOGLOBIN A1C
Hgb A1c MFr Bld: 7.2 % — ABNORMAL HIGH (ref 4.8–5.6)
Mean Plasma Glucose: 159.94 mg/dL

## 2018-06-10 LAB — LIPID PANEL
Cholesterol: 149 mg/dL (ref 0–200)
HDL: 51 mg/dL (ref >40)
LDL Cholesterol: 73 mg/dL (ref 0–99)
TRIGLYCERIDES: 125 mg/dL (ref <150)
Total CHOL/HDL Ratio: 2.9 RATIO
VLDL: 25 mg/dL (ref 0–40)

## 2018-06-10 LAB — TSH: TSH: 0.757 u[IU]/mL (ref 0.350–4.500)

## 2018-06-10 MED ORDER — RISPERIDONE 2 MG PO TABS
2.0000 mg | ORAL_TABLET | Freq: Every day | ORAL | Status: DC
  Administered 2018-06-11 – 2018-06-15 (×5): 2 mg via ORAL
  Filled 2018-06-10 (×7): qty 1

## 2018-06-10 MED ORDER — RISPERIDONE 2 MG PO TABS
4.0000 mg | ORAL_TABLET | Freq: Every day | ORAL | Status: DC
  Administered 2018-06-10 – 2018-06-14 (×5): 4 mg via ORAL
  Filled 2018-06-10 (×7): qty 2

## 2018-06-10 MED ORDER — CARBAMAZEPINE 100 MG PO CHEW
100.0000 mg | CHEWABLE_TABLET | Freq: Two times a day (BID) | ORAL | Status: DC
  Administered 2018-06-10 – 2018-06-11 (×3): 100 mg via ORAL
  Filled 2018-06-10 (×6): qty 1

## 2018-06-10 NOTE — Progress Notes (Signed)
Nursing Progress Note: 7p-7a D: Pt currently presents with a paranoid/minimal/sasd/flat/sullen affect and behavior. Pt states "I am ok." Interacting appropriately with the milieu. Pt reports good sleep during the previous night with current medication regimen.  A: Pt provided with medications per providers orders. Pt's labs and vitals were monitored throughout the night. Pt supported emotionally and encouraged to express concerns and questions. Pt educated on medications.  R: Pt's safety ensured with 15 minute and environmental checks. Pt currently denies SI, HI, and AVH. Pt verbally contracts to seek staff if SI,HI, or AVH occurs and to consult with staff before acting on any harmful thoughts. Will continue to monitor.

## 2018-06-10 NOTE — Progress Notes (Signed)
[facility] MD Progress Note  06/10/2018 9:34 AM [name] [name]  MRN:  [id number] Subjective:    Patient is less disorganized today still is a bit giddy at times she states she has a swollen left foot it is slightly puffy but we do not know what baseline is she had this yesterday on admission.  Not a drug reaction though No evidence of withdrawal her blood pressure is low but her pulse is a little bit up she was seeking benzodiazepines upon presentation but denies taking them at home Discussed her current medications the escalation we plan in the discontinuation of fluoxetine given the possibility of exacerbating mania Principal Problem: <principal problem not specified> Diagnosis: Active Problems:   Bipolar I disorder, single manic episode, severe, with psychosis (HCC)   Bipolar disorder (HCC)  Total Time spent with patient: 20 minutes  Past Medical History:  Past Medical History:  Diagnosis Date  . Anxiety disorder   . Asthma   . Bipolar affective disorder (HCC)   . Depression   . Diabetes mellitus   . Migraine   . Schizophrenia [facility])     Past Surgical History:  Procedure Laterality Date  . KNEE ARTHROSCOPY     x 2  . KNEE SURGERY Left    Incision made and knee cleaned out  . TUBAL LIGATION     Family History:  Family History  Problem Relation Age of Onset  . Bipolar disorder Mother   . Hypertension Father   . Diabetes Father     Social History:  Social History   Substance and Sexual Activity  Alcohol Use Not Currently   Comment: occasionally     Social History   Substance and Sexual Activity  Drug Use No    Social History   Socioeconomic History  . Marital status: Divorced    Spouse name: Not on file  . Number of children: Not on file  . Years of education: Not on file  . Highest education level: Not on file  Occupational History  . Not on file  Social Needs  . Financial resource strain: Not on file  . Food insecurity:    Worry: Not on file     Inability: Not on file  . Transportation needs:    Medical: Not on file    Non-medical: Not on file  Tobacco Use  . Smoking status: Current Every Day Smoker    Packs/day: 1.00    Years: 29.00    Pack years: 29.00    Types: Cigarettes  . Smokeless tobacco: Never Used  Substance and Sexual Activity  . Alcohol use: Not Currently    Comment: occasionally  . Drug use: No  . Sexual activity: Yes    Birth control/protection: Surgical  Lifestyle  . Physical activity:    Days per week: Not on file    Minutes per session: Not on file  . Stress: Not on file  Relationships  . Social connections:    Talks on phone: Not on file    Gets together: Not on file    Attends religious service: Not on file    Active member of club or organization: Not on file    Attends meetings of clubs or organizations: Not on file    Relationship status: Not on file  Other Topics Concern  . Not on file  Social History Narrative   Patient lives in [place] with her 2 kids- 80 in 49 years old.   She has total 4 kids.  She is a single mom.         Additional Social History:                         Sleep: Fair  Appetite:  Fair  Current Medications: Current Facility-Administered Medications  Medication Dose Route Frequency Provider Last Rate Last Dose  . acetaminophen (TYLENOL) tablet 650 mg  650 mg Oral Q6H PRN Maryagnes Amos, FNP   650 mg at 06/10/18 4854  . albuterol (PROVENTIL HFA;VENTOLIN HFA) 108 (90 Base) MCG/ACT inhaler 2 puff  2 puff Inhalation Q6H PRN Maryagnes Amos, FNP      . alum & mag hydroxide-simeth (MAALOX/MYLANTA) 200-200-20 MG/5ML suspension 30 mL  30 mL Oral Q4H PRN Rosario Adie, Juel Burrow, FNP      . FLUoxetine (PROZAC) capsule 10 mg  10 mg Oral Daily Starkes-Perry, Takia S, FNP      . haloperidol (HALDOL) tablet 5 mg  5 mg Oral Q6H PRN Malvin Johns, MD   5 mg at 06/09/18 2330   Or  . haloperidol lactate (HALDOL) injection 5 mg  5 mg Intramuscular Q6H  PRN Malvin Johns, MD      . haloperidol lactate (HALDOL) injection 5 mg  5 mg Intramuscular Once Maryagnes Amos, FNP      . hydrOXYzine (ATARAX/VISTARIL) tablet 50 mg  50 mg Oral TID PRN Malvin Johns, MD   50 mg at 06/09/18 2330  . insulin aspart (novoLOG) injection 4 Units  4 Units Subcutaneous TID WC Malvin Johns, MD   4 Units at 06/10/18 [phone]  . insulin detemir (LEVEMIR) injection 15 Units  15 Units Subcutaneous Daily Maryagnes Amos, FNP   15 Units at 06/10/18 0746  . lisinopril (PRINIVIL,ZESTRIL) tablet 10 mg  10 mg Oral Daily Maryagnes Amos, FNP   10 mg at 06/10/18 0746  . magnesium hydroxide (MILK OF MAGNESIA) suspension 30 mL  30 mL Oral Daily PRN Starkes-Perry, Juel Burrow, FNP      . mometasone-formoterol (DULERA) 200-5 MCG/ACT inhaler 2 puff  2 puff Inhalation BID Maryagnes Amos, FNP   2 puff at 06/10/18 0745  . multivitamin with minerals tablet 1 tablet  1 tablet Oral Daily Maryagnes Amos, FNP   1 tablet at 06/10/18 0745  . nicotine polacrilex (NICORETTE) gum 2 mg  2 mg Oral PRN Antonieta Pert, MD   2 mg at 06/10/18 3500  . OLANZapine (ZYPREXA) tablet 10 mg  10 mg Oral QHS Maryagnes Amos, FNP   10 mg at 06/09/18 2127  . [START ON 06/11/2018] risperiDONE (RISPERDAL) tablet 2 mg  2 mg Oral Daily Malvin Johns, MD      . risperiDONE (RISPERDAL) tablet 4 mg  4 mg Oral QHS Malvin Johns, MD      . traZODone (DESYREL) tablet 200 mg  200 mg Oral QHS Malvin Johns, MD   200 mg at 06/09/18 2127    Lab Results:  Results for orders placed or performed during the hospital encounter of 06/09/18 (from the past 48 hour(s))  Glucose, capillary     Status: Abnormal   Collection Time: 06/09/18  5:10 PM  Result Value Ref Range   Glucose-Capillary 174 (H) 70 - 99 mg/dL  Glucose, capillary     Status: Abnormal   Collection Time: 06/09/18 10:39 PM  Result Value Ref Range   Glucose-Capillary 343 (H) 70 - 99 mg/dL  Glucose, capillary     Status: None  Collection Time: 06/10/18  6:00 AM  Result Value Ref Range   Glucose-Capillary 93 70 - 99 mg/dL  Hemoglobin Z6XA1c     Status: Abnormal   Collection Time: 06/10/18  6:40 AM  Result Value Ref Range   Hgb A1c MFr Bld 7.2 (H) 4.8 - 5.6 %    Comment: (NOTE) Pre diabetes:          5.7%-6.4% Diabetes:              >6.4% Glycemic control for   <7.0% adults with diabetes    Mean Plasma Glucose 159.94 mg/dL    Comment: Performed at [facility], [address] [place], [place] [address]  Lipid panel     Status: None   Collection Time: 06/10/18  6:40 AM  Result Value Ref Range   Cholesterol 149 0 - 200 mg/dL   Triglycerides 540125 <981<150 mg/dL   HDL 51 >19>40 mg/dL   Total CHOL/HDL Ratio 2.9 RATIO   VLDL 25 0 - 40 mg/dL   LDL Cholesterol 73 0 - 99 mg/dL    Comment:        Total Cholesterol/HDL:CHD Risk Coronary Heart Disease Risk Table                     Men   Women  1/2 Average Risk   3.4   3.3  Average Risk       5.0   4.4  2 X Average Risk   9.6   7.1  3 X Average Risk  23.4   11.0        Use the calculated Patient Ratio above and the CHD Risk Table to determine the patient's CHD Risk.        ATP III CLASSIFICATION (LDL):  <100     mg/dL   Optimal  147-829100-129  mg/dL   Near or Above                    Optimal  130-159  mg/dL   Borderline  562-130160-189  mg/dL   High  >865>190     mg/dL   Very High Performed at [facility], [address] [place], [place] [address]   TSH     Status: None   Collection Time: 06/10/18  6:40 AM  Result Value Ref Range   TSH 0.757 0.350 - 4.500 uIU/mL    Comment: Performed by a 3rd Generation assay with a functional sensitivity of <=0.01 uIU/mL. Performed at [facility], [address] [place], [place] [address]     Blood Alcohol level:  Lab Results  Component Value Date   ETH <10 06/08/2018   ETH <10 04/05/2018    Metabolic Disorder Labs: Lab Results  Component Value Date   HGBA1C 7.2 (H) 06/10/2018   MPG  159.94 06/10/2018   MPG 274.74 01/04/2018   Lab Results  Component Value Date   PROLACTIN 10.6 08/31/2015   Lab Results  Component Value Date   CHOL 149 06/10/2018   TRIG 125 06/10/2018   HDL 51 06/10/2018   CHOLHDL 2.9 06/10/2018   VLDL 25 06/10/2018   LDLCALC 73 06/10/2018   LDLCALC 83 08/31/2015    Physical Findings: AIMS: Facial and Oral Movements Muscles of Facial Expression: None, normal Lips and Perioral Area: None, normal Jaw: None, normal Tongue: None, normal,Extremity Movements Upper (arms, wrists, hands, fingers): None, normal Lower (legs, knees, ankles, toes): None, normal, Trunk Movements Neck, shoulders,  hips: None, normal, Overall Severity Severity of abnormal movements (highest score from questions above): None, normal Incapacitation due to abnormal movements: None, normal Patient's awareness of abnormal movements (rate only patient's report): No Awareness, Dental Status Current problems with teeth and/or dentures?: No Does patient usually wear dentures?: No  CIWA:  CIWA-Ar Total: 6 COWS:  COWS Total Score: 1  Musculoskeletal: Strength & Muscle Tone: within normal limits Gait & Station: normal Patient leans: N/A  Psychiatric Specialty Exam: Physical Exam  ROS  Blood pressure 101/64, pulse (!) 119, temperature 97.9 F (36.6 C), temperature source Oral, resp. rate 16.There is no height or weight on file to calculate BMI.  General Appearance: Casual  Eye Contact:  Fair  Speech:  Slow  Volume:  Decreased  Mood:  Hypomanic at intervals giddy at intervals  Affect:  Congruent  Thought Process:  Irrelevant  Orientation:  Full (Time, Place, and Person)  Thought Content:  Tangential  Suicidal Thoughts:  No  Homicidal Thoughts:  No  Memory:  Immediate;   Fair  Judgement:  Fair  Insight:  Fair  Psychomotor Activity:  Decreased  Concentration:  Concentration: Poor  Recall:  Poor  Fund of Knowledge:  Poor  Language:  Poor  Akathisia:  Negative   Handed:  Right  AIMS (if indicated):     Assets:  Desire for Improvement  ADL's:  Intact  Cognition:  WNL  Sleep:  Number of Hours: 4.75  No paranoid delusions expressed regarding individuals [place]   Treatment Plan Summary: Daily contact with patient to assess and evaluate symptoms and progress in treatment, Medication management and Plan Discontinue fluoxetine to better treat mania, escalate Risperdal as tolerated, discussed mood stabilizer therapy, continue reality based therapy cognitive therapy and groups.  Malvin Johns, MD 06/10/2018, 9:34 AM

## 2018-06-10 NOTE — Plan of Care (Signed)
Progress note  D: pt found in bed; compliant with medication administration. Pt states she slept fair. Pt denies any depression/hopelessness/anxiety rating these all 0/10. Pt denies any physical symptoms but does endorse hip pain that she rated at a 9/10 later in the day. Pt failed to have a goal for today. Pt denies any si/hi/ah/vh and verbally agrees to approach staff if these become apparent or before harming herself or others while at [facility]. Pt is still intrusive, needy, hypersexual, but redirectable. Pt has been witnessed laughing with other patients and then leave to her room crying uncontrollably.  A: pt provided support and encouragement. Pt given medication per protocol and standing orders. Q18m safety checks implemented and continued.  R: pt safe on the unit. Will continue to monitor.   Pt progressing the following metrics  Problem: Education: Goal: Knowledge of [facility] Education information/materials will improve Outcome: Progressing Goal: Emotional status will improve Outcome: Progressing Goal: Mental status will improve Outcome: Progressing Goal: Verbalization of understanding the information provided will improve Outcome: Progressing

## 2018-06-10 NOTE — Progress Notes (Signed)
D: Patient observed sitting in dayroom this evening with bizarre affect and gestures. Patient has nicorette gum in her mouth and pushes it to the front of her mouth impeding her speech. At times motions to convey what she wants. Irritable, agitated and argumentative. Observed eating graham crackers with pancake syrup which she was able to obtain without staff knowledge. Patient's affect angry, mood labile. Denies physical complaints however complains of headache of a 7/10.   A: Medicated per orders, prn tylenol, haldol, and vistaril given for complaints. Medication education provided. Level III obs in place for safety. Emotional support offered. Encouraged to attend and participate in unit programming. Redirected as needed. Snacks, coffee creamer and sugar secured in galley. Attempted to educate patient on diabetes disease process.  R: Patient angry, argumentative and arrogantly states, "I know all about diabetes. I did not have syrup on my crackers." On reassess, patient reported some pain relief, mild decrease in agitation and anxiety after prns. Patient denies SI/HI/AVH and remains safe on level III obs. Will continue to monitor throughout the night.
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
## 2018-06-10 NOTE — Tx Team (Signed)
Interdisciplinary Treatment and Diagnostic Plan Update  06/10/2018 Time of Session: 9:00am [name] [name] MRN: [id number]  Principal Diagnosis: <principal problem not specified>  Secondary Diagnoses: Active Problems:   Bipolar I disorder, single manic episode, severe, with psychosis ([place])   Bipolar disorder ([place])   Current Medications:  Current Facility-Administered Medications  Medication Dose Route Frequency Provider Last Rate Last Dose  . acetaminophen (TYLENOL) tablet 650 mg  650 mg Oral Q6H PRN Suella Broad, FNP   650 mg at 06/10/18 5003  . albuterol (PROVENTIL HFA;VENTOLIN HFA) 108 (90 Base) MCG/ACT inhaler 2 puff  2 puff Inhalation Q6H PRN Suella Broad, FNP      . alum & mag hydroxide-simeth (MAALOX/MYLANTA) 200-200-20 MG/5ML suspension 30 mL  30 mL Oral Q4H PRN Starkes-Perry, Gayland Curry, FNP      . carbamazepine (TEGRETOL) chewable tablet 100 mg  100 mg Oral BID Johnn Hai, MD      . haloperidol (HALDOL) tablet 5 mg  5 mg Oral Q6H PRN Johnn Hai, MD   5 mg at 06/09/18 2330   Or  . haloperidol lactate (HALDOL) injection 5 mg  5 mg Intramuscular Q6H PRN Johnn Hai, MD      . haloperidol lactate (HALDOL) injection 5 mg  5 mg Intramuscular Once Suella Broad, FNP      . hydrOXYzine (ATARAX/VISTARIL) tablet 50 mg  50 mg Oral TID PRN Johnn Hai, MD   50 mg at 06/09/18 2330  . insulin aspart (novoLOG) injection 4 Units  4 Units Subcutaneous TID WC Johnn Hai, MD   4 Units at 06/10/18 [phone]  . insulin detemir (LEVEMIR) injection 15 Units  15 Units Subcutaneous Daily Suella Broad, FNP   15 Units at 06/10/18 0746  . lisinopril (PRINIVIL,ZESTRIL) tablet 10 mg  10 mg Oral Daily Suella Broad, FNP   10 mg at 06/10/18 0746  . magnesium hydroxide (MILK OF MAGNESIA) suspension 30 mL  30 mL Oral Daily PRN Starkes-Perry, Gayland Curry, FNP      . mometasone-formoterol (DULERA) 200-5 MCG/ACT inhaler 2 puff  2 puff Inhalation BID Suella Broad,  FNP   2 puff at 06/10/18 0745  . multivitamin with minerals tablet 1 tablet  1 tablet Oral Daily Suella Broad, FNP   1 tablet at 06/10/18 0745  . nicotine polacrilex (NICORETTE) gum 2 mg  2 mg Oral PRN Sharma Covert, MD   2 mg at 06/10/18 8891  . OLANZapine (ZYPREXA) tablet 10 mg  10 mg Oral QHS Suella Broad, FNP   10 mg at 06/09/18 2127  . [START ON 06/11/2018] risperiDONE (RISPERDAL) tablet 2 mg  2 mg Oral Daily Johnn Hai, MD      . risperiDONE (RISPERDAL) tablet 4 mg  4 mg Oral QHS Johnn Hai, MD      . traZODone (DESYREL) tablet 200 mg  200 mg Oral QHS Johnn Hai, MD   200 mg at 06/09/18 2127   PTA Medications: Medications Prior to Admission  Medication Sig Dispense Refill Last Dose  . acetaminophen (TYLENOL) 500 MG tablet Take 1,000 mg by mouth every 6 (six) hours as needed for mild pain or headache.   06/07/2018 at Unknown time  . albuterol (PROVENTIL HFA;VENTOLIN HFA) 108 (90 Base) MCG/ACT inhaler Inhale 2 puffs into the lungs every 6 (six) hours as needed for wheezing or shortness of breath. 1 Inhaler 0 Past Week at Unknown time  . blood glucose meter kit and supplies Dispense based on patient  and insurance preference. Use up to four times daily as directed. (FOR ICD-10 E10.9, E11.9). 1 each 0   . FLUoxetine (PROZAC) 10 MG capsule Take 1 capsule (10 mg total) by mouth daily. (Patient not taking: Reported on 04/05/2018) 30 capsule 0 Not Taking at Unknown time  . Fluticasone-Salmeterol (ADVAIR) 250-50 MCG/DOSE AEPB Inhale 1 puff into the lungs 2 (two) times daily.   unknown  . insulin aspart (NOVOLOG FLEXPEN) 100 UNIT/ML FlexPen Inject 3 Units into the skin 3 (three) times daily with meals. (Patient taking differently: Inject 4 Units into the skin 3 (three) times daily with meals. ) 15 mL 0 Past Week at Unknown time  . Insulin Detemir (LEVEMIR FLEXTOUCH) 100 UNIT/ML Pen Inject 15 Units into the skin daily. (Patient not taking: Reported on 06/08/2018) 15 mL 0 Not  Taking at Unknown time  . Insulin Pen Needle (PEN NEEDLES 3/16") 31G X 5 MM MISC 15 Units by Does not apply route daily. 100 each 0   . lisinopril (PRINIVIL,ZESTRIL) 10 MG tablet Take 1 tablet (10 mg total) by mouth daily. (Patient not taking: Reported on 04/05/2018) 30 tablet 0 Not Taking at Unknown time    Patient Stressors: Marital or family conflict Medication change or noncompliance  Patient Strengths: General fund of knowledge Supportive family/friends  Treatment Modalities: Medication Management, Group therapy, Case management,  1 to 1 session with clinician, Psychoeducation, Recreational therapy.   Physician Treatment Plan for Primary Diagnosis: <principal problem not specified> Long Term Goal(s): Improvement in symptoms so as ready for discharge Improvement in symptoms so as ready for discharge   Short Term Goals: Ability to verbalize feelings will improve Ability to disclose and discuss suicidal ideas Ability to demonstrate self-control will improve Ability to identify and develop effective coping behaviors will improve Ability to maintain clinical measurements within normal limits will improve  Medication Management: Evaluate patient's response, side effects, and tolerance of medication regimen.  Therapeutic Interventions: 1 to 1 sessions, Unit Group sessions and Medication administration.  Evaluation of Outcomes: Not Met  Physician Treatment Plan for Secondary Diagnosis: Active Problems:   Bipolar I disorder, single manic episode, severe, with psychosis ([place])   Bipolar disorder ([place])  Long Term Goal(s): Improvement in symptoms so as ready for discharge Improvement in symptoms so as ready for discharge   Short Term Goals: Ability to verbalize feelings will improve Ability to disclose and discuss suicidal ideas Ability to demonstrate self-control will improve Ability to identify and develop effective coping behaviors will improve Ability to maintain clinical  measurements within normal limits will improve     Medication Management: Evaluate patient's response, side effects, and tolerance of medication regimen.  Therapeutic Interventions: 1 to 1 sessions, Unit Group sessions and Medication administration.  Evaluation of Outcomes: Progressing   RN Treatment Plan for Primary Diagnosis: <principal problem not specified> Long Term Goal(s): Knowledge of disease and therapeutic regimen to maintain health will improve  Short Term Goals: Ability to remain free from injury will improve, Ability to verbalize frustration and anger appropriately will improve, Ability to verbalize feelings will improve, Ability to disclose and discuss suicidal ideas, Ability to identify and develop effective coping behaviors will improve and Compliance with prescribed medications will improve  Medication Management: RN will administer medications as ordered by provider, will assess and evaluate patient's response and provide education to patient for prescribed medication. RN will report any adverse and/or side effects to prescribing provider.  Therapeutic Interventions: 1 on 1 counseling sessions, Psychoeducation, Medication administration, Evaluate responses to  treatment, Monitor vital signs and CBGs as ordered, Perform/monitor CIWA, COWS, AIMS and Fall Risk screenings as ordered, Perform wound care treatments as ordered.  Evaluation of Outcomes: Not Met   LCSW Treatment Plan for Primary Diagnosis: <principal problem not specified> Long Term Goal(s): Safe transition to appropriate next level of care at discharge, Engage patient in therapeutic group addressing interpersonal concerns.  Short Term Goals: Engage patient in aftercare planning with referrals and resources, Increase social support, Increase ability to appropriately verbalize feelings, Increase emotional regulation, Identify triggers associated with mental health/substance abuse issues and Increase skills for wellness  and recovery  Therapeutic Interventions: Assess for all discharge needs, 1 to 1 time with Social worker, Explore available resources and support systems, Assess for adequacy in community support network, Educate family and significant other(s) on suicide prevention, Complete Psychosocial Assessment, Interpersonal group therapy.  Evaluation of Outcomes: Not Met   Progress in Treatment: Attending groups: No. New to unit. Participating in groups: No. Taking medication as prescribed: Yes. Toleration medication: Yes. Family/Significant other contact made: No, will contact:  supports if consent is granted Patient understands diagnosis: Yes. Discussing patient identified problems/goals with staff: No. Medical problems stabilized or resolved: No. Denies suicidal/homicidal ideation: No. Issues/concerns per patient self-inventory: No.   New problem(s) identified: No, Describe:  CSW continuing to assess  New Short Term/Long Term Goal(s): medication management for mood stabilization; elimination of SI thoughts; development of comprehensive mental wellness/sobriety plan.  Patient Goals: Eliminate SI, develop more effective coping skills  Discharge Plan or Barriers: Patient expected to discharge home and follow up with outpatient resources. [place] pamphlet, Mobile Crisis information, and AA/NA information provided to patient for additional community support and resources.   Reason for Continuation of Hospitalization: Anxiety Delusions  Depression Mania Medication stabilization Suicidal ideation  Estimated Length of Stay: 3-5 days  Attendees: Patient: 06/10/2018 9:45 AM  Physician: Dr.Farah 06/10/2018 9:45 AM  Nursing:  06/10/2018 9:45 AM  RN Care Manager: 06/10/2018 9:45 AM  Social Worker: Stephanie Acre, [place] 06/10/2018 9:45 AM  Recreational Therapist:  06/10/2018 9:45 AM  Other:  06/10/2018 9:45 AM  Other:  06/10/2018 9:45 AM  Other: 06/10/2018 9:45 AM    Scribe for Treatment Team: Joellen Jersey,  LCSWA 06/10/2018 9:45 AM

## 2018-06-11 LAB — GLUCOSE, CAPILLARY
Glucose-Capillary: 113 mg/dL — ABNORMAL HIGH (ref 70–99)
Glucose-Capillary: 152 mg/dL — ABNORMAL HIGH (ref 70–99)
Glucose-Capillary: 189 mg/dL — ABNORMAL HIGH (ref 70–99)
Glucose-Capillary: 220 mg/dL — ABNORMAL HIGH (ref 70–99)
Glucose-Capillary: 150 mg/dL — ABNORMAL HIGH (ref 70–99)

## 2018-06-11 MED ORDER — DICLOFENAC SODIUM 1 % TD GEL
2.0000 g | Freq: Four times a day (QID) | TRANSDERMAL | Status: DC | PRN
  Administered 2018-06-11 – 2018-06-15 (×7): 2 g via TOPICAL
  Filled 2018-06-11: qty 100

## 2018-06-11 MED ORDER — INSULIN ASPART 100 UNIT/ML ~~LOC~~ SOLN
4.0000 [IU] | Freq: Three times a day (TID) | SUBCUTANEOUS | Status: DC
  Administered 2018-06-11 – 2018-06-14 (×11): 4 [IU] via SUBCUTANEOUS

## 2018-06-11 MED ORDER — INSULIN ASPART 100 UNIT/ML ~~LOC~~ SOLN
0.0000 [IU] | Freq: Three times a day (TID) | SUBCUTANEOUS | Status: DC
  Administered 2018-06-11: 5 [IU] via SUBCUTANEOUS
  Administered 2018-06-11 – 2018-06-14 (×10): 3 [IU] via SUBCUTANEOUS
  Administered 2018-06-15: 2 [IU] via SUBCUTANEOUS
  Administered 2018-06-15: 5 [IU] via SUBCUTANEOUS

## 2018-06-11 MED ORDER — CARBAMAZEPINE 200 MG PO TABS
200.0000 mg | ORAL_TABLET | Freq: Two times a day (BID) | ORAL | Status: DC
  Administered 2018-06-11 – 2018-06-15 (×8): 200 mg via ORAL
  Filled 2018-06-11 (×12): qty 1

## 2018-06-11 NOTE — Plan of Care (Signed)
D: Patient presents manic, bright, bizarre. This morning the patient was hyper-focused on her power being turned off at her house. She was demanding to make payments by getting her phone and credit card. She was given the power company customer service number, but declined to call for assistance with payment extension. Throughout the day, she became increasingly disorganized and bizarre. She stood at the nurse's station at one point, talking to the exit doors and having a full conversation. When asked who she was talking to, she stated "My facebook account." When asked about whether the nicotine gum was helpful, she said "I am craving ice cream less." Patient denies SI/HI/AVH, but appears to be responding to internal stimuli. She slept well last night, but declined to receive PRN medication. Her appetite is good, energy normal and concentration good. She rates her depression, anxiety and hopelessness 0/10. She denies withdrawal symptoms. She does have some edema to bilateral hands and BLE. She complains of "neuropathy and headaches" and is somatically preoccupied. Her goal is to "feel better, get out and go home." A: Patient checked q15 min, and checks reviewed. Reviewed medication changes with patient and educated on side effects. Educated patient on importance of attending group therapy sessions and educated on several coping skills. Encouarged participation in milieu through recreation therapy and attending meals with peers. Support and encouragement provided. Fluids offered. R: Patient receptive to education on medications, and is medication compliant. Patient contracts for safety on the unit.

## 2018-06-11 NOTE — Progress Notes (Signed)
Patient's son would like a call from social work to help discuss placement after discharge. They are her primary care taker when she is unable to take care of herself/not taking her medications as prescribed. They need recommendations on who to contact such as ALFs or home care. Son: [name] Welchel [phone].

## 2018-06-11 NOTE — Progress Notes (Signed)
D: Patient came to me while I was on the phone and set a piece of paper with chewed gum smeared on it in front of me and walked off. I promptly disposed of it in the shredder. Patient later began yelling at me when she asked where her piece of paper with her phone numbers went. She was screaming and demanding that I retrieve it out of the shredder because it had a valuable number on it.  A: Retrieved paper out of shredder. R: Patient apologetic. Presenting manic and disorganized and bizarre.

## 2018-06-11 NOTE — Progress Notes (Signed)
Adult Psychoeducational Group Note  Date:  06/11/2018 Time:  9:29 PM  Group Topic/Focus:  Wrap-Up Group:   The focus of this group is to help patients review their daily goal of treatment and discuss progress on daily workbooks.  Participation Level:  Active  Participation Quality:  Appropriate  Affect:  Appropriate  Cognitive:  Appropriate  Insight: Appropriate  Engagement in Group:  Engaged  Modes of Intervention:  Discussion  Additional Comments: The patient expressed that he attended groups.The patient also said that she rates today a 8.  Octavio Manns 06/11/2018, 9:29 PM

## 2018-06-11 NOTE — Progress Notes (Signed)
[facility] MD Progress Note  06/11/2018 10:25 AM [name] [name]  MRN:  [id number] Subjective:    Patient is complaining general puffiness of her hands and feet and this is indeed there but is not pitting or anything states it is a chronic problem for her, she is already on lisinopril. She remains giddy and silly and giggles through the interview she is seen in the day room with staff members but no other patients and earshot but she continues to make sexual statements at times stating "I love you" and giggling during the interview, she also is requesting we prescribed marijuana for pain or give her opiates for migraine headaches so despite her disorganization she is somewhat drug-seeking As discussed below alert oriented to person place situation and day generally cooperative but again making some silly responses and giggling through her interview due to her bipolar symptomatology.  Denies wanting to harm self or others and denies specific hallucinations  Principal Problem: Problem manic with psychosis/drug-seeking behaviors Diagnosis: Active Problems:   Bipolar I disorder, single manic episode, severe, with psychosis (HCC)   Bipolar disorder (HCC)  Total Time spent with patient: 20 minutes  Past Medical History:  Past Medical History:  Diagnosis Date  . Anxiety disorder   . Asthma   . Bipolar affective disorder (HCC)   . Depression   . Diabetes mellitus   . Migraine   . Schizophrenia [facility])     Past Surgical History:  Procedure Laterality Date  . KNEE ARTHROSCOPY     x 2  . KNEE SURGERY Left    Incision made and knee cleaned out  . TUBAL LIGATION     Family History:  Family History  Problem Relation Age of Onset  . Bipolar disorder Mother   . Hypertension Father   . Diabetes Father    Family Psychiatric  History: ukn Social History:  Social History   Substance and Sexual Activity  Alcohol Use Not Currently   Comment: occasionally     Social History   Substance and Sexual  Activity  Drug Use No    Social History   Socioeconomic History  . Marital status: Divorced    Spouse name: Not on file  . Number of children: Not on file  . Years of education: Not on file  . Highest education level: Not on file  Occupational History  . Not on file  Social Needs  . Financial resource strain: Not on file  . Food insecurity:    Worry: Not on file    Inability: Not on file  . Transportation needs:    Medical: Not on file    Non-medical: Not on file  Tobacco Use  . Smoking status: Current Every Day Smoker    Packs/day: 1.00    Years: 29.00    Pack years: 29.00    Types: Cigarettes  . Smokeless tobacco: Never Used  Substance and Sexual Activity  . Alcohol use: Not Currently    Comment: occasionally  . Drug use: No  . Sexual activity: Yes    Birth control/protection: Surgical  Lifestyle  . Physical activity:    Days per week: Not on file    Minutes per session: Not on file  . Stress: Not on file  Relationships  . Social connections:    Talks on phone: Not on file    Gets together: Not on file    Attends religious service: Not on file    Active member of club or organization: Not on  file    Attends meetings of clubs or organizations: Not on file    Relationship status: Not on file  Other Topics Concern  . Not on file  Social History Narrative   Patient lives in [place] with her 2 kids- [id number] in 48 years old.   She has total 4 kids.   She is a single mom.         Additional Social History:                         Sleep: Fair  Appetite:  Fair  Current Medications: Current Facility-Administered Medications  Medication Dose Route Frequency Provider Last Rate Last Dose  . acetaminophen (TYLENOL) tablet 650 mg  650 mg Oral Q6H PRN Maryagnes AmosStarkes-Perry, Takia S, FNP   650 mg at 06/11/18 1004  . albuterol (PROVENTIL HFA;VENTOLIN HFA) 108 (90 Base) MCG/ACT inhaler 2 puff  2 puff Inhalation Q6H PRN Maryagnes AmosStarkes-Perry, Takia S, FNP      . alum & mag  hydroxide-simeth (MAALOX/MYLANTA) 200-200-20 MG/5ML suspension 30 mL  30 mL Oral Q4H PRN Starkes-Perry, Juel Burrowakia S, FNP      . carbamazepine (TEGRETOL) chewable tablet 200 mg  200 mg Oral BID Malvin JohnsFarah, Taejon Irani, MD      . haloperidol (HALDOL) tablet 5 mg  5 mg Oral Q6H PRN Malvin JohnsFarah, Dearl Rudden, MD   5 mg at 06/09/18 2330   Or  . haloperidol lactate (HALDOL) injection 5 mg  5 mg Intramuscular Q6H PRN Malvin JohnsFarah, Reyes Aldaco, MD      . haloperidol lactate (HALDOL) injection 5 mg  5 mg Intramuscular Once Maryagnes AmosStarkes-Perry, Takia S, FNP      . hydrOXYzine (ATARAX/VISTARIL) tablet 50 mg  50 mg Oral TID PRN Malvin JohnsFarah, Reene Harlacher, MD   50 mg at 06/09/18 2330  . insulin aspart (novoLOG) injection 4 Units  4 Units Subcutaneous TID WC Malvin JohnsFarah, Shloimy Michalski, MD   4 Units at 06/11/18 [phone]  . insulin detemir (LEVEMIR) injection 15 Units  15 Units Subcutaneous Daily Maryagnes AmosStarkes-Perry, Takia S, FNP   15 Units at 06/11/18 [id number]  . lisinopril (PRINIVIL,ZESTRIL) tablet 10 mg  10 mg Oral Daily Maryagnes AmosStarkes-Perry, Takia S, FNP   10 mg at 06/11/18 0752  . magnesium hydroxide (MILK OF MAGNESIA) suspension 30 mL  30 mL Oral Daily PRN Starkes-Perry, Juel Burrowakia S, FNP      . mometasone-formoterol (DULERA) 200-5 MCG/ACT inhaler 2 puff  2 puff Inhalation BID Maryagnes AmosStarkes-Perry, Takia S, FNP   2 puff at 06/11/18 0753  . multivitamin with minerals tablet 1 tablet  1 tablet Oral Daily Maryagnes AmosStarkes-Perry, Takia S, FNP   1 tablet at 06/11/18 [id number]  . nicotine polacrilex (NICORETTE) gum 2 mg  2 mg Oral PRN Antonieta Pertlary, Greg Lawson, MD   2 mg at 06/11/18 1024  . risperiDONE (RISPERDAL) tablet 2 mg  2 mg Oral Daily Malvin JohnsFarah, Evelisse Szalkowski, MD   2 mg at 06/11/18 [id number]  . risperiDONE (RISPERDAL) tablet 4 mg  4 mg Oral QHS Malvin JohnsFarah, Jozeph Persing, MD   4 mg at 06/10/18 2119  . traZODone (DESYREL) tablet 200 mg  200 mg Oral QHS Malvin JohnsFarah, Katrisha Segall, MD   200 mg at 06/10/18 2119    Lab Results:  Results for orders placed or performed during the hospital encounter of 06/09/18 (from the past 48 hour(s))  Glucose, capillary     Status: Abnormal    Collection Time: 06/09/18  5:10 PM  Result Value Ref Range   Glucose-Capillary 174 (H) 70 - 99 mg/dL  Glucose,  capillary     Status: Abnormal   Collection Time: 06/09/18 10:39 PM  Result Value Ref Range   Glucose-Capillary 343 (H) 70 - 99 mg/dL  Glucose, capillary     Status: None   Collection Time: 06/10/18  6:00 AM  Result Value Ref Range   Glucose-Capillary 93 70 - 99 mg/dL  Hemoglobin K9X     Status: Abnormal   Collection Time: 06/10/18  6:40 AM  Result Value Ref Range   Hgb A1c MFr Bld 7.2 (H) 4.8 - 5.6 %    Comment: (NOTE) Pre diabetes:          5.7%-6.4% Diabetes:              >6.4% Glycemic control for   <7.0% adults with diabetes    Mean Plasma Glucose 159.94 mg/dL    Comment: Performed at [facility], [address] [place], [place] [address]  Lipid panel     Status: None   Collection Time: 06/10/18  6:40 AM  Result Value Ref Range   Cholesterol 149 0 - 200 mg/dL   Triglycerides 505 <397 mg/dL   HDL 51 >67 mg/dL   Total CHOL/HDL Ratio 2.9 RATIO   VLDL 25 0 - 40 mg/dL   LDL Cholesterol 73 0 - 99 mg/dL    Comment:        Total Cholesterol/HDL:CHD Risk Coronary Heart Disease Risk Table                     Men   Women  1/2 Average Risk   3.4   3.3  Average Risk       5.0   4.4  2 X Average Risk   9.6   7.1  3 X Average Risk  23.4   11.0        Use the calculated Patient Ratio above and the CHD Risk Table to determine the patient's CHD Risk.        ATP III CLASSIFICATION (LDL):  <100     mg/dL   Optimal  341-937  mg/dL   Near or Above                    Optimal  130-159  mg/dL   Borderline  902-409  mg/dL   High  >735     mg/dL   Very High Performed at [facility], [address] [place], [place] [address]   TSH     Status: None   Collection Time: 06/10/18  6:40 AM  Result Value Ref Range   TSH 0.757 0.350 - 4.500 uIU/mL    Comment: Performed by a 3rd Generation assay with a functional sensitivity of <=0.01 uIU/mL. Performed  at [facility], [address] [place], [place] [address]   Glucose, capillary     Status: Abnormal   Collection Time: 06/10/18 12:14 PM  Result Value Ref Range   Glucose-Capillary 204 (H) 70 - 99 mg/dL  Glucose, capillary     Status: Abnormal   Collection Time: 06/10/18  4:57 PM  Result Value Ref Range   Glucose-Capillary 212 (H) 70 - 99 mg/dL  Glucose, capillary     Status: Abnormal   Collection Time: 06/11/18  6:09 AM  Result Value Ref Range   Glucose-Capillary 113 (H) 70 - 99 mg/dL  Glucose, capillary     Status: Abnormal   Collection Time: 06/11/18  8:02 AM  Result Value Ref Range  Glucose-Capillary 152 (H) 70 - 99 mg/dL    Blood Alcohol level:  Lab Results  Component Value Date   ETH <10 06/08/2018   ETH <10 04/05/2018    Metabolic Disorder Labs: Lab Results  Component Value Date   HGBA1C 7.2 (H) 06/10/2018   MPG 159.94 06/10/2018   MPG 274.74 01/04/2018   Lab Results  Component Value Date   PROLACTIN 10.6 08/31/2015   Lab Results  Component Value Date   CHOL 149 06/10/2018   TRIG 125 06/10/2018   HDL 51 06/10/2018   CHOLHDL 2.9 06/10/2018   VLDL 25 06/10/2018   LDLCALC 73 06/10/2018   LDLCALC 83 08/31/2015    Physical Findings: AIMS: Facial and Oral Movements Muscles of Facial Expression: None, normal Lips and Perioral Area: None, normal Jaw: None, normal Tongue: None, normal,Extremity Movements Upper (arms, wrists, hands, fingers): None, normal Lower (legs, knees, ankles, toes): None, normal, Trunk Movements Neck, shoulders, hips: None, normal, Overall Severity Severity of abnormal movements (highest score from questions above): None, normal Incapacitation due to abnormal movements: None, normal Patient's awareness of abnormal movements (rate only patient's report): No Awareness, Dental Status Current problems with teeth and/or dentures?: No Does patient usually wear dentures?: No  CIWA:  CIWA-Ar Total: 6 COWS:  COWS Total  Score: 1  Musculoskeletal: Strength & Muscle Tone: within normal limits Gait & Station: normal Patient leans: N/A  Psychiatric Specialty Exam: Physical Exam  ROS  Blood pressure 117/67, pulse (!) 117, temperature 97.9 F (36.6 C), temperature source Oral, resp. rate 16.There is no height or weight on file to calculate BMI.  General Appearance: Casual and Disheveled  Eye Contact:  Minimal  Speech:  Slurred  Volume:  Decreased  Mood:  Euphoric  Affect:  Congruent  Thought Process:  Disorganized  Orientation:  Full (Time, Place, and Person)  Thought Content:  Illogical and Tangential  Suicidal Thoughts:  No  Homicidal Thoughts:  No  Memory:  Immediate;   Fair  Judgement:  Fair  Insight:  Lacking and Shallow  Psychomotor Activity:  Psychomotor Retardation  Concentration:  Concentration: Poor  Recall:  [employer] of Knowledge:  Fair  Language:  Fair  Akathisia:  Negative  Handed:  Right  AIMS (if indicated):     Assets:  Leisure Time Resilience  ADL's:  Intact  Cognition:  WNL  Sleep:  Number of Hours: 4.75     Treatment Plan Summary: Daily contact with patient to assess and evaluate symptoms and progress in treatment, Medication management and Plan For bipolar symptoms continue Risperdal escalate Tegretol continue reality-based therapy for hypersexual focus continue to see patient with escorts and in day room, for symptomatology continue to engage in reality and cognitive based therapies continue 15-minute checks  Crosby Bevan, MD 06/11/2018, 10:25 AM

## 2018-06-11 NOTE — Progress Notes (Signed)
Nursing Progress Note: 7p-7a D: Pt currently presents with a hyperactive/bizarre presentaion/blocking/tangential affect and behavior. Pt states "I need to wear a mask on top of my head to cover my head from bad smells. Im going to wear one over my nose too so not to get an infection from gassy people in the day room." Interacting intrusively with the milieu. Pt reports good sleep during the previous night with current medication regimen. Pt did attend wrap-up group.  A: Pt provided with medications per providers orders. Pt's labs and vitals were monitored throughout the night. Pt supported emotionally and encouraged to express concerns and questions. Pt educated on medications.  R: Pt's safety ensured with 15 minute and environmental checks. Pt currently denies SI, HI, and AVH. Pt verbally contracts to seek staff if SI,HI, or AVH occurs and to consult with staff before acting on any harmful thoughts. Will continue to monitor.

## 2018-06-11 NOTE — [facility] Group Notes (Signed)
[facility] Group Therapy 06/11/2018 2:06 PM  Type of Therapy: [facility] Presentation  Participation Level: Did not attend.   Lexxus Underhill, MSW, LCSWA 06/11/2018 2:04 PM 

## 2018-06-12 LAB — GLUCOSE, CAPILLARY
GLUCOSE-CAPILLARY: 165 mg/dL — AB (ref 70–99)
GLUCOSE-CAPILLARY: 163 mg/dL — AB (ref 70–99)
Glucose-Capillary: 191 mg/dL — ABNORMAL HIGH (ref 70–99)
Glucose-Capillary: 197 mg/dL — ABNORMAL HIGH (ref 70–99)

## 2018-06-12 MED ORDER — IBUPROFEN 600 MG PO TABS
600.0000 mg | ORAL_TABLET | Freq: Four times a day (QID) | ORAL | Status: DC | PRN
  Administered 2018-06-13 – 2018-06-15 (×5): 600 mg via ORAL
  Filled 2018-06-12 (×5): qty 1

## 2018-06-12 NOTE — Progress Notes (Signed)
Nursing Progress Note: 7p-7a D: Pt currently presents with a depressed/anxious/hyperactive/paranoid affect and behavior. Pt states "I am trying to be funny. Lighten to mood around here. I just don't know what to do." Interacting appropriately with the milieu. Pt reports good sleep during the previous night with current medication regimen. Pt did attend wrap-up group.  A: Pt provided with medications per providers orders. Pt's labs and vitals were monitored throughout the night. Pt supported emotionally and encouraged to express concerns and questions. Pt educated on medications.  R: Pt's safety ensured with 15 minute and environmental checks. Pt currently denies SI, HI, and AVH. Pt verbally contracts to seek staff if SI,HI, or AVH occurs and to consult with staff before acting on any harmful thoughts. Will continue to monitor.

## 2018-06-12 NOTE — Progress Notes (Signed)
[facility]  06/12/2018 9:25 AM [name] DionesLocke S Thrush  MRN:  [id number] Subjective:     Patient slept very soundly perhaps due to PRN medications but when she is aroused she is still disorganized in her presentation though she is alert and oriented to person place situation she is unaware of the exact date, she continues to make disjointed unusual statements but no formed delusions. Involuntary movements No inappropriateness today but some getting is still Denies thoughts of harming self or others still not baseline  Principal Problem: Bipolar manic with psychosis history of substance use history of noncompliance Diagnosis: Active Problems:   Bipolar I disorder, single manic episode, severe, with psychosis (HCC)   Bipolar disorder (HCC)  Total Time spent with patient: 20 minutes  Past Psychiatric History: As above  Past Medical History:  Past Medical History:  Diagnosis Date  . Anxiety disorder   . Asthma   . Bipolar affective disorder (HCC)   . Depression   . Diabetes mellitus   . Migraine   . Schizophrenia [facility](HCC)     Past Surgical History:  Procedure Laterality Date  . KNEE ARTHROSCOPY     x 2  . KNEE SURGERY Left    Incision made and knee cleaned out  . TUBAL LIGATION     Family History:  Family History  Problem Relation Age of Onset  . Bipolar disorder Mother   . Hypertension Father   . Diabetes Father     Social History:  Social History   Substance and Sexual Activity  Alcohol Use Not Currently   Comment: occasionally     Social History   Substance and Sexual Activity  Drug Use No    Social History   Socioeconomic History  . Marital status: Divorced    Spouse name: Not on file  . Number of children: Not on file  . Years of education: Not on file  . Highest education level: Not on file  Occupational History  . Not on file  Social Needs  . Financial resource strain: Not on file  . Food insecurity:    Worry: Not on file    Inability: Not on  file  . Transportation needs:    Medical: Not on file    Non-medical: Not on file  Tobacco Use  . Smoking status: Current Every Day Smoker    Packs/day: 1.00    Years: 29.00    Pack years: 29.00    Types: Cigarettes  . Smokeless tobacco: Never Used  Substance and Sexual Activity  . Alcohol use: Not Currently    Comment: occasionally  . Drug use: No  . Sexual activity: Yes    Birth control/protection: Surgical  Lifestyle  . Physical activity:    Days per week: Not on file    Minutes per session: Not on file  . Stress: Not on file  Relationships  . Social connections:    Talks on phone: Not on file    Gets together: Not on file    Attends religious service: Not on file    Active member of club or organization: Not on file    Attends meetings of clubs or organizations: Not on file    Relationship status: Not on file  Other Topics Concern  . Not on file  Social History Narrative   Patient lives in [place] with her 2 kids- 3114 in 49 years old.   She has total 4 kids.   She is a single mom.  Additional Social History:                         Sleep: Fair  Appetite:  Fair  Current Medications: Current Facility-Administered Medications  Medication Dose Route Frequency Provider Last Rate Last Dose  . acetaminophen (TYLENOL) tablet 650 mg  650 mg Oral Q6H PRN Maryagnes Amos, FNP   650 mg at 06/11/18 2255  . albuterol (PROVENTIL HFA;VENTOLIN HFA) 108 (90 Base) MCG/ACT inhaler 2 puff  2 puff Inhalation Q6H PRN Maryagnes Amos, FNP      . alum & mag hydroxide-simeth (MAALOX/MYLANTA) 200-200-20 MG/5ML suspension 30 mL  30 mL Oral Q4H PRN Starkes-Perry, Juel Burrow, FNP      . carbamazepine (TEGRETOL) tablet 200 mg  200 mg Oral BID Malvin Johns, MD   200 mg at 06/12/18 0851  . diclofenac sodium (VOLTAREN) 1 % transdermal gel 2 g  2 g Topical QID PRN Armandina Stammer I, NP   2 g at 06/11/18 1600  . haloperidol (HALDOL) tablet 5 mg  5 mg Oral Q6H PRN  Malvin Johns, MD   5 mg at 06/12/18 0030   Or  . haloperidol lactate (HALDOL) injection 5 mg  5 mg Intramuscular Q6H PRN Malvin Johns, MD      . haloperidol lactate (HALDOL) injection 5 mg  5 mg Intramuscular Once Maryagnes Amos, FNP      . hydrOXYzine (ATARAX/VISTARIL) tablet 50 mg  50 mg Oral TID PRN Malvin Johns, MD   50 mg at 06/12/18 0030  . insulin aspart (novoLOG) injection 0-15 Units  0-15 Units Subcutaneous TID WC Armandina Stammer I, NP   3 Units at 06/12/18 0855  . insulin aspart (novoLOG) injection 4 Units  4 Units Subcutaneous TID WC Nwoko, Agnes I, NP   4 Units at 06/12/18 0900  . insulin detemir (LEVEMIR) injection 15 Units  15 Units Subcutaneous Daily Maryagnes Amos, FNP   15 Units at 06/12/18 0856  . lisinopril (PRINIVIL,ZESTRIL) tablet 10 mg  10 mg Oral Daily Maryagnes Amos, FNP   10 mg at 06/12/18 0851  . magnesium hydroxide (MILK OF MAGNESIA) suspension 30 mL  30 mL Oral Daily PRN Starkes-Perry, Juel Burrow, FNP      . mometasone-formoterol (DULERA) 200-5 MCG/ACT inhaler 2 puff  2 puff Inhalation BID Maryagnes Amos, FNP   2 puff at 06/12/18 0851  . multivitamin with minerals tablet 1 tablet  1 tablet Oral Daily Maryagnes Amos, FNP   1 tablet at 06/12/18 0851  . nicotine polacrilex (NICORETTE) gum 2 mg  2 mg Oral PRN Antonieta Pert, MD   2 mg at 06/12/18 [phone]  . risperiDONE (RISPERDAL) tablet 2 mg  2 mg Oral Daily Malvin Johns, MD   2 mg at 06/12/18 0851  . risperiDONE (RISPERDAL) tablet 4 mg  4 mg Oral QHS Malvin Johns, MD   4 mg at 06/11/18 2145  . traZODone (DESYREL) tablet 200 mg  200 mg Oral QHS Malvin Johns, MD   200 mg at 06/11/18 2145    Lab Results:  Results for orders placed or performed during the hospital encounter of 06/09/18 (from the past 48 hour(s))  Glucose, capillary     Status: Abnormal   Collection Time: 06/10/18 12:14 PM  Result Value Ref Range   Glucose-Capillary 204 (H) 70 - 99 mg/dL  Glucose, capillary     Status:  Abnormal   Collection Time: 06/10/18  4:57 PM  Result Value Ref Range   Glucose-Capillary 212 (H) 70 - 99 mg/dL  Glucose, capillary     Status: Abnormal   Collection Time: 06/11/18  6:09 AM  Result Value Ref Range   Glucose-Capillary 113 (H) 70 - 99 mg/dL  Glucose, capillary     Status: Abnormal   Collection Time: 06/11/18  8:02 AM  Result Value Ref Range   Glucose-Capillary 152 (H) 70 - 99 mg/dL  Glucose, capillary     Status: Abnormal   Collection Time: 06/11/18 11:58 AM  Result Value Ref Range   Glucose-Capillary 189 (H) 70 - 99 mg/dL  Glucose, capillary     Status: Abnormal   Collection Time: 06/11/18  4:59 PM  Result Value Ref Range   Glucose-Capillary 220 (H) 70 - 99 mg/dL   Comment 1 Notify RN    Comment 2 Document in Chart   Glucose, capillary     Status: Abnormal   Collection Time: 06/11/18  8:26 PM  Result Value Ref Range   Glucose-Capillary 150 (H) 70 - 99 mg/dL  Glucose, capillary     Status: Abnormal   Collection Time: 06/12/18  8:54 AM  Result Value Ref Range   Glucose-Capillary 165 (H) 70 - 99 mg/dL    Blood Alcohol level:  Lab Results  Component Value Date   ETH <10 06/08/2018   ETH <10 04/05/2018    Metabolic Disorder Labs: Lab Results  Component Value Date   HGBA1C 7.2 (H) 06/10/2018   MPG 159.94 06/10/2018   MPG 274.74 01/04/2018   Lab Results  Component Value Date   PROLACTIN 10.6 08/31/2015   Lab Results  Component Value Date   CHOL 149 06/10/2018   TRIG 125 06/10/2018   HDL 51 06/10/2018   CHOLHDL 2.9 06/10/2018   VLDL 25 06/10/2018   LDLCALC 73 06/10/2018   LDLCALC 83 08/31/2015    Physical Findings: AIMS: Facial and Oral Movements Muscles of Facial Expression: None, normal Lips and Perioral Area: None, normal Jaw: None, normal Tongue: None, normal,Extremity Movements Upper (arms, wrists, hands, fingers): None, normal Lower (legs, knees, ankles, toes): None, normal, Trunk Movements Neck, shoulders, hips: None, normal, Overall  Severity Severity of abnormal movements (highest score from questions above): None, normal Incapacitation due to abnormal movements: None, normal Patient's awareness of abnormal movements (rate only patient's report): No Awareness, Dental Status Current problems with teeth and/or dentures?: No Does patient usually wear dentures?: No  CIWA:  CIWA-Ar Total: 0 COWS:  COWS Total Score: 2  Musculoskeletal: Strength & Muscle Tone: within normal limits Gait & Station: normal Patient leans: N/A  Psychiatric Specialty Exam: Physical Exam  ROS  Blood pressure (!) 126/98, pulse (!) 124, temperature 97.9 F (36.6 C), temperature source Oral, resp. rate 16.There is no height or weight on file to calculate BMI.  General Appearance: Casual and Disheveled  Eye Contact:  Fair  Speech:  Slow and Slurred  Volume:  Decreased  Mood:  Euphoric  Affect:  Congruent  Thought Process:  Disorganized  Orientation: Person place general situation and month  Thought Content:  Illogical and Delusions  Suicidal Thoughts:  No  Homicidal Thoughts:  No  Memory:  Immediate;   Fair  Judgement:  Fair  Insight:  Shallow  Psychomotor Activity:  Decreased  Concentration:  Concentration: Fair  Recall:  Poor  Fund of Knowledge:  Poor  Language:  Poor  Akathisia:  Negative  Handed:  Right  AIMS (if indicated):     Assets:  Leisure Time  Physical Health  ADL's:  Intact  Cognition:  WNL  Sleep:  Number of Hours: 3.75     Treatment Plan Summary: Daily contact with patient to assess and evaluate symptoms and progress in treatment, Medication management and Plan For bipolar symptomatology continue mood stabilizer therapy, continue antipsychotic therapy continue cognitive and reality based therapies continue current precautions probably needs to stay through the weekend.  If she fails to fully respond to risperidone may try perphenazine or another agent  Malvin Johns, MD 06/12/2018, 9:25 AM

## 2018-06-12 NOTE — [facility] Group Notes (Signed)
Adult Psychoeducational Group Note  Date:  06/12/2018 Time:  10:10 PM  Group Topic/Focus:  Wrap-Up Group:   The focus of this group is to help patients review their daily goal of treatment and discuss progress on daily workbooks.  Participation Level:  Active  Participation Quality:  Appropriate and Attentive  Affect:  Appropriate  Cognitive:  Alert and Appropriate  Insight: Appropriate and Good  Engagement in Group:  Engaged  Modes of Intervention:  Discussion and Education  Additional Comments:  Pt attended and participated in wrap up group this evening. Pt rated their day an 8/10, due to them "waking up" and talking to their children. Pt goal was to keep their swelling down, which they have not been successful at.   Chrisandra Netters 06/12/2018, 10:10 PM

## 2018-06-12 NOTE — Progress Notes (Signed)
PT attended spirituality group led by chaplain.   Group focused on topic of hope.  Group members engaged in facilitated discussion around topic of hope, identifying areas they have experienced hope in their life and resources for hope.   Engaged in visual explorer activity to identify what hope means for them today.   

## 2018-06-12 NOTE — Plan of Care (Signed)
Progress note  D: pt found in the hallway; compliant with medication administration. Pt states she slept fair. Pt rates her depression/hopelessness/anxiety a 0/0/0 out of 10 respectively. Pt denies any physical symptoms or pain, rating her pain a 0/10. Pt did complain of generalized pain later in the day, rating this a 7-8 out of 10. Pt pt states her goal for today is to get her pain under control and her mental health. Pt will achieve this by taking meds. Pt denies any si/hi/ah/vh and verbally agrees to approach staff if these become apparent or before harming herself or others while at [facility]. Pt continues to be intrusive, manic, intrusive with boundaries, and sexually inappropriate with female staff.  A: pt provided support and encouragement. Pt given medication per protocol and standing orders. Q3m safety checks implemented and continued.  R: pt safe on the unit. Will continue to monitor.   Pt progressing in the following metrics  Problem: Coping: Goal: Ability to verbalize frustrations and anger appropriately will improve Outcome: Progressing Goal: Ability to demonstrate self-control will improve Outcome: Progressing   Problem: Health Behavior/Discharge Planning: Goal: Identification of resources available to assist in meeting health care needs will improve Outcome: Progressing Goal: Compliance with treatment plan for underlying cause of condition will improve Outcome: Progressing

## 2018-06-13 DIAGNOSIS — F319 Bipolar disorder, unspecified: Secondary | ICD-10-CM

## 2018-06-13 LAB — GLUCOSE, CAPILLARY
Glucose-Capillary: 185 mg/dL — ABNORMAL HIGH (ref 70–99)
Glucose-Capillary: 152 mg/dL — ABNORMAL HIGH (ref 70–99)
Glucose-Capillary: 164 mg/dL — ABNORMAL HIGH (ref 70–99)
Glucose-Capillary: 124 mg/dL — ABNORMAL HIGH (ref 70–99)

## 2018-06-13 MED ORDER — SERTRALINE HCL 25 MG PO TABS
25.0000 mg | ORAL_TABLET | Freq: Every day | ORAL | Status: DC
  Administered 2018-06-13 – 2018-06-15 (×3): 25 mg via ORAL
  Filled 2018-06-13 (×5): qty 1

## 2018-06-13 NOTE — Plan of Care (Signed)
Pt presents with a flat affect and a depressed mood. Pt noted to be sad and tearful this morning  on approach. Pt expressed that she's ready to get out of the hospital and would like to  know her discharge date. Pt appears to be oriented at times with periods of mild confusion and disorganized content. Pt reported fair sleep last night. Pt denies SI/HI. Pt denies AVH. Pt appears disheveled. Pt noted to have tied socks around her head into a headband.   Medications administered as ordered per MD. Medications reviewed with pt. Pt c/o a headache and tylenol was provided to the pt for relief. Verbal support provided. Pt encouraged to attend groups. 15 minute checks performed for safety.  Pt compliant with tx plan. Pt denies any side effects to medications.  Care Plan Problem: Activity: Goal: Interest or engagement in activities will improve 06/13/2018 1110 by Layla Barter, RN Outcome: Progressing 06/13/2018 1109 by Layla Barter, RN Outcome: Progressing   Problem: Coping: Goal: Ability to demonstrate self-control will improve Outcome: Progressing   Problem: Safety: Goal: Periods of time without injury will increase Outcome: Progressing

## 2018-06-13 NOTE — [facility] Group Notes (Signed)
LCSW Group Therapy Note  06/13/2018    11:15am-12:00pm  Type of Therapy and Topic:  Group Therapy: Early Messages Received About Anger  Participation Level:  Active   Description of Group:   In this group, patients shared and discussed the early messages received in their lives about anger through parental or other adult modeling, teaching, repression, punishment, violence, and more.  Participants identified how those childhood lessons influence even now how they usually or often react when angered.  The group discussed that anger is a secondary emotion and what may be the underlying emotional themes that come out through anger outbursts or that are ignored through anger suppression.  Finally, as a group there was a conversation about the workbook's quote that "There is nothing wrong with anger; it is just a sign something needs to change."     Therapeutic Goals: 1. Patients will identify one or more childhood message about anger that they received and how it was taught to them. 2. Patients will discuss how these childhood experiences have influenced and continue to influence their own expression or repression of anger even today. 3. Patients will explore possible primary emotions that tend to fuel their secondary emotion of anger. 4. Patients will learn that anger itself is normal and cannot be eliminated, and that healthier coping skills can assist with resolving conflict rather than worsening situations.  Summary of Patient Progress:  The patient shared that her childhood lessons about anger were to push it under the rug.  As a result, that is what she has taught her own children.  On the other hand, when she is alone she will let out her anger by yelling and throwing dishes.  Therapeutic Modalities:   Cognitive Behavioral Therapy Motivation Interviewing  Lynnell Chad  .

## 2018-06-13 NOTE — Progress Notes (Signed)
D:Pt has been out in the dayroom this afternoon interacting with peers and staff. She was given coloring pages and find a work as requested during free time while some other patients were watching football. Pt has been mildly confused at times with a sad affect. A:Offered support, encouragement and 15 minute checks. Medications administered as ordered. R:Pt verbally denies si and hi. Safety maintained on the unit.

## 2018-06-13 NOTE — [facility] Group Notes (Signed)
  Pt did not attend RN Psychoeducational group this morning. 

## 2018-06-13 NOTE — Progress Notes (Signed)
[facility] MD Progress Note  06/13/2018 9:54 AM KALAI TESH  MRN:  [id number] Subjective: Patient is seen and examined.  Patient is a 48 year old female with a past psychiatric history significant for bipolar disorder, most recently manic who was admitted on 06/09/2018.  Patient was admitted at that time secondary to neglecting personal hygiene, leaving gas appliances on at home and unattended, and throwing her close and personal items out of her home.  She was significantly agitated on admission.  She stated she had not been compliant with medications at home on admission because "my son would not take me to appointments".  The patient arrived in the emergency department by [facility] police after her son called stating she was trying to kill her self by leaving the stove on.  Objective: Patient is a 48 year old female seen in follow-up with the above-stated past psychiatric history.  She stated she is better today.  She stated that she is crying because she misses her family.  She is still labile and tearful.  She remains on Risperdal 2 mg p.o. daily and 4 mg p.o. nightly.  She is also on Tegretol 200 mg p.o. twice daily.  She initially received haloperidol IM on admission secondary to agitation.  Her flowsheet showed that her blood pressure stable but she is tachycardic with a rate of 121.  She reportedly slept 6 hours.  Her blood sugar this a.m. is 185.  Tegretol level has not been obtained as of yet.  She denied any suicidal ideation, but is easily tearful.  Her last admission at [facility] and was diagnosed with schizoaffective disorder; depressive type.  Her discharge medications at that time were Lamictal 100 mg p.o. twice daily, Zoloft 25 mg p.o. daily, Seroquel 25 mg p.o. nightly.  She denied suicidal ideation currently.  Principal Problem: <principal problem not specified> Diagnosis: Active Problems:   Bipolar I disorder, single manic episode, severe, with  psychosis (HCC)   Bipolar disorder (HCC)  Total Time spent with patient: 30 minutes  Past Psychiatric History: See admission H&P  Past Medical History:  Past Medical History:  Diagnosis Date  . Anxiety disorder   . Asthma   . Bipolar affective disorder (HCC)   . Depression   . Diabetes mellitus   . Migraine   . Schizophrenia [facility])     Past Surgical History:  Procedure Laterality Date  . KNEE ARTHROSCOPY     x 2  . KNEE SURGERY Left    Incision made and knee cleaned out  . TUBAL LIGATION     Family History:  Family History  Problem Relation Age of Onset  . Bipolar disorder Mother   . Hypertension Father   . Diabetes Father    Family Psychiatric  History: See admission H&P Social History:  Social History   Substance and Sexual Activity  Alcohol Use Not Currently   Comment: occasionally     Social History   Substance and Sexual Activity  Drug Use No    Social History   Socioeconomic History  . Marital status: Divorced    Spouse name: Not on file  . Number of children: Not on file  . Years of education: Not on file  . Highest education level: Not on file  Occupational History  . Not on file  Social Needs  . Financial resource strain: Not on file  . Food insecurity:    Worry: Not on file    Inability: Not on file  .  Transportation needs:    Medical: Not on file    Non-medical: Not on file  Tobacco Use  . Smoking status: Current Every Day Smoker    Packs/day: 1.00    Years: 29.00    Pack years: 29.00    Types: Cigarettes  . Smokeless tobacco: Never Used  Substance and Sexual Activity  . Alcohol use: Not Currently    Comment: occasionally  . Drug use: No  . Sexual activity: Yes    Birth control/protection: Surgical  Lifestyle  . Physical activity:    Days per week: Not on file    Minutes per session: Not on file  . Stress: Not on file  Relationships  . Social connections:    Talks on phone: Not on file    Gets together: Not on file     Attends religious service: Not on file    Active member of club or organization: Not on file    Attends meetings of clubs or organizations: Not on file    Relationship status: Not on file  Other Topics Concern  . Not on file  Social History Narrative   Patient lives in [place] with her 2 kids- 85 in 45 years old.   She has total 4 kids.   She is a single mom.         Additional Social History:                         Sleep: Fair  Appetite:  Fair  Current Medications: Current Facility-Administered Medications  Medication Dose Route Frequency Provider Last Rate Last Dose  . acetaminophen (TYLENOL) tablet 650 mg  650 mg Oral Q6H PRN Maryagnes Amos, FNP   650 mg at 06/13/18 0811  . albuterol (PROVENTIL HFA;VENTOLIN HFA) 108 (90 Base) MCG/ACT inhaler 2 puff  2 puff Inhalation Q6H PRN Maryagnes Amos, FNP      . alum & mag hydroxide-simeth (MAALOX/MYLANTA) 200-200-20 MG/5ML suspension 30 mL  30 mL Oral Q4H PRN Starkes-Perry, Juel Burrow, FNP      . carbamazepine (TEGRETOL) tablet 200 mg  200 mg Oral BID Malvin Johns, MD   200 mg at 06/13/18 4098  . diclofenac sodium (VOLTAREN) 1 % transdermal gel 2 g  2 g Topical QID PRN Armandina Stammer I, NP   2 g at 06/12/18 1315  . haloperidol (HALDOL) tablet 5 mg  5 mg Oral Q6H PRN Malvin Johns, MD   5 mg at 06/12/18 2115   Or  . haloperidol lactate (HALDOL) injection 5 mg  5 mg Intramuscular Q6H PRN Malvin Johns, MD      . haloperidol lactate (HALDOL) injection 5 mg  5 mg Intramuscular Once Maryagnes Amos, FNP      . hydrOXYzine (ATARAX/VISTARIL) tablet 50 mg  50 mg Oral TID PRN Malvin Johns, MD   50 mg at 06/12/18 2114  . ibuprofen (ADVIL,MOTRIN) tablet 600 mg  600 mg Oral Q6H PRN Nira Conn A, NP      . insulin aspart (novoLOG) injection 0-15 Units  0-15 Units Subcutaneous TID WC Armandina Stammer I, NP   3 Units at 06/13/18 [phone]  . insulin aspart (novoLOG) injection 4 Units  4 Units Subcutaneous TID WC Armandina Stammer I, NP    4 Units at 06/13/18 [phone]  . insulin detemir (LEVEMIR) injection 15 Units  15 Units Subcutaneous Daily Maryagnes Amos, FNP   15 Units at 06/13/18 0813  . lisinopril (PRINIVIL,ZESTRIL) tablet 10  mg  10 mg Oral Daily Maryagnes Amos, FNP   10 mg at 06/13/18 3953  . magnesium hydroxide (MILK OF MAGNESIA) suspension 30 mL  30 mL Oral Daily PRN Starkes-Perry, Juel Burrow, FNP      . mometasone-formoterol (DULERA) 200-5 MCG/ACT inhaler 2 puff  2 puff Inhalation BID Maryagnes Amos, FNP   2 puff at 06/12/18 0851  . multivitamin with minerals tablet 1 tablet  1 tablet Oral Daily Maryagnes Amos, FNP   1 tablet at 06/13/18 2023  . nicotine polacrilex (NICORETTE) gum 2 mg  2 mg Oral PRN Antonieta Pert, MD   2 mg at 06/13/18 0819  . risperiDONE (RISPERDAL) tablet 2 mg  2 mg Oral Daily Malvin Johns, MD   2 mg at 06/13/18 3435  . risperiDONE (RISPERDAL) tablet 4 mg  4 mg Oral QHS Malvin Johns, MD   4 mg at 06/12/18 2114  . traZODone (DESYREL) tablet 200 mg  200 mg Oral QHS Malvin Johns, MD   200 mg at 06/12/18 2114    Lab Results:  Results for orders placed or performed during the hospital encounter of 06/09/18 (from the past 48 hour(s))  Glucose, capillary     Status: Abnormal   Collection Time: 06/11/18 11:58 AM  Result Value Ref Range   Glucose-Capillary 189 (H) 70 - 99 mg/dL  Glucose, capillary     Status: Abnormal   Collection Time: 06/11/18  4:59 PM  Result Value Ref Range   Glucose-Capillary 220 (H) 70 - 99 mg/dL   Comment 1 Notify RN    Comment 2 Document in Chart   Glucose, capillary     Status: Abnormal   Collection Time: 06/11/18  8:26 PM  Result Value Ref Range   Glucose-Capillary 150 (H) 70 - 99 mg/dL  Glucose, capillary     Status: Abnormal   Collection Time: 06/12/18  8:54 AM  Result Value Ref Range   Glucose-Capillary 165 (H) 70 - 99 mg/dL  Glucose, capillary     Status: Abnormal   Collection Time: 06/12/18 12:03 PM  Result Value Ref Range    Glucose-Capillary 163 (H) 70 - 99 mg/dL   Comment 1 Notify RN    Comment 2 Document in Chart   Glucose, capillary     Status: Abnormal   Collection Time: 06/12/18  4:47 PM  Result Value Ref Range   Glucose-Capillary 191 (H) 70 - 99 mg/dL   Comment 1 Notify RN   Glucose, capillary     Status: Abnormal   Collection Time: 06/12/18  9:03 PM  Result Value Ref Range   Glucose-Capillary 197 (H) 70 - 99 mg/dL   Comment 1 Notify RN    Comment 2 Document in Chart   Glucose, capillary     Status: Abnormal   Collection Time: 06/13/18  6:18 AM  Result Value Ref Range   Glucose-Capillary 185 (H) 70 - 99 mg/dL   Comment 1 Notify RN    Comment 2 Document in Chart     Blood Alcohol level:  Lab Results  Component Value Date   ETH <10 06/08/2018   ETH <10 04/05/2018    Metabolic Disorder Labs: Lab Results  Component Value Date   HGBA1C 7.2 (H) 06/10/2018   MPG 159.94 06/10/2018   MPG 274.74 01/04/2018   Lab Results  Component Value Date   PROLACTIN 10.6 08/31/2015   Lab Results  Component Value Date   CHOL 149 06/10/2018   TRIG 125 06/10/2018  HDL 51 06/10/2018   CHOLHDL 2.9 06/10/2018   VLDL 25 06/10/2018   LDLCALC 73 06/10/2018   LDLCALC 83 08/31/2015    Physical Findings: AIMS: Facial and Oral Movements Muscles of Facial Expression: None, normal Lips and Perioral Area: None, normal Jaw: None, normal Tongue: None, normal,Extremity Movements Upper (arms, wrists, hands, fingers): None, normal Lower (legs, knees, ankles, toes): None, normal, Trunk Movements Neck, shoulders, hips: None, normal, Overall Severity Severity of abnormal movements (highest score from questions above): None, normal Incapacitation due to abnormal movements: None, normal Patient's awareness of abnormal movements (rate only patient's report): No Awareness, Dental Status Current problems with teeth and/or dentures?: No Does patient usually wear dentures?: No  CIWA:  CIWA-Ar Total: 0 COWS:  COWS  Total Score: 2  Musculoskeletal: Strength & Muscle Tone: within normal limits Gait & Station: normal Patient leans: N/A  Psychiatric Specialty Exam: Physical Exam  Nursing note and vitals reviewed. Constitutional: She is oriented to person, place, and time. She appears well-developed and well-nourished.  HENT:  Head: Normocephalic and atraumatic.  Respiratory: Effort normal.  Neurological: She is alert and oriented to person, place, and time.    ROS  Blood pressure 124/86, pulse (!) 121, temperature 97.7 F (36.5 C), temperature source Oral, resp. rate 18.There is no height or weight on file to calculate BMI.  General Appearance: Disheveled  Eye Contact:  Fair  Speech:  Normal Rate  Volume:  Decreased  Mood:  Depressed  Affect:  Congruent  Thought Process:  Coherent and Descriptions of Associations: Intact  Orientation:  Full (Time, Place, and Person)  Thought Content:  Logical  Suicidal Thoughts:  No  Homicidal Thoughts:  No  Memory:  Immediate;   Fair Recent;   Fair Remote;   Fair  Judgement:  Intact  Insight:  Fair  Psychomotor Activity:  Decreased and Psychomotor Retardation  Concentration:  Concentration: Fair and Attention Span: Fair  Recall:  [employer]Fair  Fund of Knowledge:  Fair  Language:  Fair  Akathisia:  Negative  Handed:  Right  AIMS (if indicated):     Assets:  Desire for Improvement Housing Physical Health Resilience  ADL's:  Intact  Cognition:  WNL  Sleep:  Number of Hours: 6     Treatment Plan Summary: Daily contact with patient to assess and evaluate symptoms and progress in treatment, Medication management and Plan : Patient is seen and examined.  Patient is a 48 year old female with a past psychiatric history significant for bipolar disorder, most recently mixed, severe versus schizoaffective disorder; bipolar type most recently depressed.  She is seen in follow-up.  It appears as though she was significantly manic on admission, and has calmed down  significantly.  She appears to be significantly depressed currently.  Review of the electronic medical record revealed that she had been previously treated with low-dose Zoloft.  Because of her depressive symptoms I am going to re-add the 25 mg of Zoloft a day.  We will monitor to make sure that she does not become manic from this.  I will continue the Risperdal, trazodone and Tegretol.  Her blood sugar this morning is 185.  She is on a sliding scale, insulin NovoLog 4 units 3 times daily with meals, and Levemir at at bedtime.  I am going to increase her Levemir dosage to 20 units subcu daily. 1.  Add Zoloft 25 mg p.o. daily for depressive symptoms, monitor for exacerbation of mania. 2.  Continue Tegretol 200 mg p.o. twice daily for mood stability.  3.  Continue Risperdal 4 mg p.o. nightly and 2 mg p.o. daily for mood stability and sleep. 4.  Continue sliding scale insulin for diabetes. 5.  Continue NovoLog insulin 4 units subcu 3 times daily AC for diabetes. 6.  Increase detemir to 1 units subcu daily.  For diabetes. 7.  Continue lisinopril 10 mg p.o. daily for hypertension and renal protection. 8.  Continue all as needed medications. 9.  Disposition planning-in progress.  Antonieta Pert, MD 06/13/2018, 9:54 AM

## 2018-06-14 LAB — GLUCOSE, CAPILLARY
Glucose-Capillary: 179 mg/dL — ABNORMAL HIGH (ref 70–99)
Glucose-Capillary: 152 mg/dL — ABNORMAL HIGH (ref 70–99)
Glucose-Capillary: 159 mg/dL — ABNORMAL HIGH (ref 70–99)
Glucose-Capillary: 112 mg/dL — ABNORMAL HIGH (ref 70–99)

## 2018-06-14 MED ORDER — ONDANSETRON 8 MG PO TBDP
8.0000 mg | ORAL_TABLET | Freq: Once | ORAL | Status: AC
  Administered 2018-06-14: 8 mg via ORAL
  Filled 2018-06-14: qty 1
  Filled 2018-06-14: qty 2

## 2018-06-14 MED ORDER — ONDANSETRON 4 MG PO TBDP: 4.0000 mg | ORAL_TABLET | Freq: Four times a day (QID) | ORAL | Status: DC | PRN

## 2018-06-14 MED ORDER — PROMETHAZINE HCL 25 MG/ML IJ SOLN
25.0000 mg | Freq: Once | INTRAMUSCULAR | Status: AC
  Administered 2018-06-14: 25 mg via INTRAMUSCULAR
  Filled 2018-06-14: qty 1

## 2018-06-14 MED ORDER — INSULIN DETEMIR 100 UNIT/ML ~~LOC~~ SOLN
20.0000 [IU] | Freq: Every day | SUBCUTANEOUS | Status: DC
  Administered 2018-06-15: 20 [IU] via SUBCUTANEOUS

## 2018-06-14 NOTE — Progress Notes (Signed)
Patient ID: [name] [name], female   DOB: [birth year], 48 y.o.   MRN: [id number] D: Assumed care patient @ 2330. Patient in bed sleeping. Respiration regular and unlabored. No sign of distress noted at this time A: 15 mins checks for safety. R: Patient remains safe.

## 2018-06-14 NOTE — Progress Notes (Signed)
[facility]  06/14/2018 11:14 AM Retta DionesLocke S Piano  MRN:  [id number] Subjective:  Patient is seen and examined.  Patient is a 48 year old female with a past psychiatric history significant for bipolar disorder, most recently manic who was admitted on 06/09/2018.  Patient was admitted at that time secondary to neglecting personal hygiene, leaving gas appliances on at home and unattended, and throwing her close and personal items out of her home.  She was significantly agitated on admission.  She stated she had not been compliant with medications at home on admission because "my son would not take me to appointments".  The [place] [facility] after her son called stating she was trying to kill her self by leaving the stove on.  Objective: Patient is a 48 year old female seen in follow-up with the above-stated past psychiatric history.  She is less tearful today.  She is less labile.  She states she still misses her family but is able to smile and engage today.  She denied any suicidal ideation, she denied any manic symptoms.  She did state that her suicidal thoughts had decreased.  Her blood pressure stable this morning and 112/71.  Her pulse continues to be elevated at 118.  She slept 6.5 hours last night.  Principal Problem: <principal problem not specified> Diagnosis: Active Problems:   Bipolar I disorder, single manic episode, severe, with psychosis (HCC)   Bipolar disorder (HCC)  Total Time spent with patient: 15 minutes  Past Psychiatric History: See admission H&P  Past Medical History:  Past Medical History:  Diagnosis Date  . Anxiety disorder   . Asthma   . Bipolar affective disorder (HCC)   . Depression   . Diabetes mellitus   . Migraine   . Schizophrenia [facility](HCC)     Past Surgical History:  Procedure Laterality Date  . KNEE ARTHROSCOPY     x 2  . KNEE SURGERY Left    Incision made and knee cleaned out  . TUBAL LIGATION      Family History:  Family History  Problem Relation Age of Onset  . Bipolar disorder Mother   . Hypertension Father   . Diabetes Father    Family Psychiatric  History: See admission H&P Social History:  Social History   Substance and Sexual Activity  Alcohol Use Not Currently   Comment: occasionally     Social History   Substance and Sexual Activity  Drug Use No    Social History   Socioeconomic History  . Marital status: Divorced    Spouse name: Not on file  . Number of children: Not on file  . Years of education: Not on file  . Highest education level: Not on file  Occupational History  . Not on file  Social Needs  . Financial resource strain: Not on file  . Food insecurity:    Worry: Not on file    Inability: Not on file  . Transportation needs:    Medical: Not on file    Non-medical: Not on file  Tobacco Use  . Smoking status: Current Every Day Smoker    Packs/day: 1.00    Years: 29.00    Pack years: 29.00    Types: Cigarettes  . Smokeless tobacco: Never Used  Substance and Sexual Activity  . Alcohol use: Not Currently    Comment: occasionally  . Drug use: No  . Sexual activity: Yes    Birth control/protection: Surgical  Lifestyle  .  Physical activity:    Days per week: Not on file    Minutes per session: Not on file  . Stress: Not on file  Relationships  . Social connections:    Talks on phone: Not on file    Gets together: Not on file    Attends religious service: Not on file    Active member of club or organization: Not on file    Attends meetings of clubs or organizations: Not on file    Relationship status: Not on file  Other Topics Concern  . Not on file  Social History Narrative   [place] her 2 kids- [id number] in 49 years old.   She has total 4 kids.   She is a single mom.         Additional Social History:                         Sleep: Fair  Appetite:  Good  Current Medications: Current  Facility-Administered Medications  Medication Dose Route Frequency Provider Last Rate Last Dose  . acetaminophen (TYLENOL) tablet 650 mg  650 mg Oral Q6H PRN Maryagnes AmosStarkes-Perry, Takia S, FNP   650 mg at 06/14/18 0913  . albuterol (PROVENTIL HFA;VENTOLIN HFA) 108 (90 Base) MCG/ACT inhaler 2 puff  2 puff Inhalation Q6H PRN Maryagnes AmosStarkes-Perry, Takia S, FNP      . alum & mag hydroxide-simeth (MAALOX/MYLANTA) 200-200-20 MG/5ML suspension 30 mL  30 mL Oral Q4H PRN Starkes-Perry, Juel Burrowakia S, FNP      . carbamazepine (TEGRETOL) tablet 200 mg  200 mg Oral BID Malvin JohnsFarah, Brian, MD   200 mg at 06/14/18 0846  . diclofenac sodium (VOLTAREN) 1 % transdermal gel 2 g  2 g Topical QID PRN Armandina StammerNwoko, Agnes I, NP   2 g at 06/14/18 0913  . haloperidol (HALDOL) tablet 5 mg  5 mg Oral Q6H PRN Malvin JohnsFarah, Brian, MD   5 mg at 06/12/18 2115   Or  . haloperidol lactate (HALDOL) injection 5 mg  5 mg Intramuscular Q6H PRN Malvin JohnsFarah, Brian, MD      . haloperidol lactate (HALDOL) injection 5 mg  5 mg Intramuscular Once Maryagnes AmosStarkes-Perry, Takia S, FNP      . hydrOXYzine (ATARAX/VISTARIL) tablet 50 mg  50 mg Oral TID PRN Malvin JohnsFarah, Brian, MD   50 mg at 06/13/18 1623  . ibuprofen (ADVIL,MOTRIN) tablet 600 mg  600 mg Oral Q6H PRN Nira ConnBerry, Jason A, NP   600 mg at 06/13/18 2134  . insulin aspart (novoLOG) injection 0-15 Units  0-15 Units Subcutaneous TID WC Armandina StammerNwoko, Agnes I, NP   3 Units at 06/14/18 0650  . insulin aspart (novoLOG) injection 4 Units  4 Units Subcutaneous TID WC Armandina StammerNwoko, Agnes I, NP   4 Units at 06/14/18 [id number]  . insulin detemir (LEVEMIR) injection 15 Units  15 Units Subcutaneous Daily Maryagnes AmosStarkes-Perry, Takia S, FNP   15 Units at 06/14/18 [id number]  . lisinopril (PRINIVIL,ZESTRIL) tablet 10 mg  10 mg Oral Daily Maryagnes AmosStarkes-Perry, Takia S, FNP   10 mg at 06/14/18 0846  . magnesium hydroxide (MILK OF MAGNESIA) suspension 30 mL  30 mL Oral Daily PRN Starkes-Perry, Juel Burrowakia S, FNP      . mometasone-formoterol (DULERA) 200-5 MCG/ACT inhaler 2 puff  2 puff Inhalation BID  Maryagnes AmosStarkes-Perry, Takia S, FNP   2 puff at 06/14/18 0849  . multivitamin with minerals tablet 1 tablet  1 tablet Oral Daily Starkes-Perry, Juel Burrowakia S, FNP   1 tablet  at 06/14/18 0846  . nicotine polacrilex (NICORETTE) gum 2 mg  2 mg Oral PRN Antonieta Pert, MD   2 mg at 06/13/18 1907  . risperiDONE (RISPERDAL) tablet 2 mg  2 mg Oral Daily Malvin Johns, MD   2 mg at 06/14/18 0847  . risperiDONE (RISPERDAL) tablet 4 mg  4 mg Oral QHS Malvin Johns, MD   4 mg at 06/13/18 2133  . sertraline (ZOLOFT) tablet 25 mg  25 mg Oral Daily Antonieta Pert, MD   25 mg at 06/14/18 0847  . traZODone (DESYREL) tablet 200 mg  200 mg Oral QHS Malvin Johns, MD   200 mg at 06/13/18 2133    Lab Results:  Results for orders placed or performed during the hospital encounter of 06/09/18 (from the past 48 hour(s))  Glucose, capillary     Status: Abnormal   Collection Time: 06/12/18 12:03 PM  Result Value Ref Range   Glucose-Capillary 163 (H) 70 - 99 mg/dL   Comment 1 Notify RN    Comment 2 Document in Chart   Glucose, capillary     Status: Abnormal   Collection Time: 06/12/18  4:47 PM  Result Value Ref Range   Glucose-Capillary 191 (H) 70 - 99 mg/dL   Comment 1 Notify RN   Glucose, capillary     Status: Abnormal   Collection Time: 06/12/18  9:03 PM  Result Value Ref Range   Glucose-Capillary 197 (H) 70 - 99 mg/dL   Comment 1 Notify RN    Comment 2 Document in Chart   Glucose, capillary     Status: Abnormal   Collection Time: 06/13/18  6:18 AM  Result Value Ref Range   Glucose-Capillary 185 (H) 70 - 99 mg/dL   Comment 1 Notify RN    Comment 2 Document in Chart   Glucose, capillary     Status: Abnormal   Collection Time: 06/13/18 11:53 AM  Result Value Ref Range   Glucose-Capillary 152 (H) 70 - 99 mg/dL  Glucose, capillary     Status: Abnormal   Collection Time: 06/13/18  5:05 PM  Result Value Ref Range   Glucose-Capillary 164 (H) 70 - 99 mg/dL  Glucose, capillary     Status: Abnormal   Collection  Time: 06/13/18  8:29 PM  Result Value Ref Range   Glucose-Capillary 124 (H) 70 - 99 mg/dL  Glucose, capillary     Status: Abnormal   Collection Time: 06/14/18  6:39 AM  Result Value Ref Range   Glucose-Capillary 179 (H) 70 - 99 mg/dL   Comment 1 Notify RN    Comment 2 Document in Chart     Blood Alcohol level:  Lab Results  Component Value Date   ETH <10 06/08/2018   ETH <10 04/05/2018    Metabolic Disorder Labs: Lab Results  Component Value Date   HGBA1C 7.2 (H) 06/10/2018   MPG 159.94 06/10/2018   MPG 274.74 01/04/2018   Lab Results  Component Value Date   PROLACTIN 10.6 08/31/2015   Lab Results  Component Value Date   CHOL 149 06/10/2018   TRIG 125 06/10/2018   HDL 51 06/10/2018   CHOLHDL 2.9 06/10/2018   VLDL 25 06/10/2018   LDLCALC 73 06/10/2018   LDLCALC 83 08/31/2015    Physical Findings: AIMS: Facial and Oral Movements Muscles of Facial Expression: None, normal Lips and Perioral Area: None, normal Jaw: None, normal Tongue: None, normal,Extremity Movements Upper (arms, wrists, hands, fingers): None, normal Lower (legs, knees, ankles,  toes): None, normal, Trunk Movements Neck, shoulders, hips: None, normal, Overall Severity Severity of abnormal movements (highest score from questions above): None, normal Incapacitation due to abnormal movements: None, normal Patient's awareness of abnormal movements (rate only patient's report): No Awareness, Dental Status Current problems with teeth and/or dentures?: No Does patient usually wear dentures?: No  CIWA:  CIWA-Ar Total: 0 COWS:  COWS Total Score: 2  Musculoskeletal: Strength & Muscle Tone: within normal limits Gait & Station: normal Patient leans: N/A  Psychiatric Specialty Exam: Physical Exam  Nursing note and vitals reviewed. Constitutional: She is oriented to person, place, and time. She appears well-developed and well-nourished.  HENT:  Head: Normocephalic and atraumatic.  Respiratory: Effort  normal.  Neurological: She is alert and oriented to person, place, and time.    ROS  Blood pressure 112/71, pulse (!) 118, temperature 98.3 F (36.8 C), temperature source Oral, resp. rate 18.There is no height or weight on file to calculate BMI.  General Appearance: Casual  Eye Contact:  Fair  Speech:  Normal Rate  Volume:  Normal  Mood:  Depressed  Affect:  Congruent  Thought Process:  Coherent and Descriptions of Associations: Intact  Orientation:  Full (Time, Place, and Person)  Thought Content:  Logical  Suicidal Thoughts:  No  Homicidal Thoughts:  No  Memory:  Immediate;   Fair Recent;   Fair Remote;   Fair  Judgement:  Intact  Insight:  Fair  Psychomotor Activity:  Normal  Concentration:  Concentration: Fair and Attention Span: Fair  Recall:  [employer] of Knowledge:  Fair  Language:  Fair  Akathisia:  Negative  Handed:  Right  AIMS (if indicated):     Assets:  Communication Skills Desire for Improvement Housing Physical Health Resilience  ADL's:  Intact  Cognition:  WNL  Sleep:  Number of Hours: 6.5     Treatment Plan Summary: Daily contact with patient to assess and evaluate symptoms and progress in treatment, Medication management and Plan : Patient is seen and examined.  Patient is a 48 year old female with a past psychiatric history significant for bipolar disorder, most recently mixed, severe versus schizoaffective disorder; bipolar type most recently depressed.  She is seen in follow-up. Patient appears to be less labile today.  She is not crying as frequently.  She is able to smile and engage.  Zoloft was started at 25 mg p.o. daily yesterday.  No evidence of mania at least at this point.  Her blood sugar this morning is 179.  I am not going to change any of her psychiatric medications today.  I am going to order for a Tegretol level, liver function enzymes, and a CBC with differential tomorrow a.m.  I am going to increase her Levemir to 20 units subcu  nightly.  I am going to order an EKG for the a.m. tomorrow to make sure about her tachycardia. 1.  Continue Zoloft 25 mg p.o. daily for depressive symptoms and anxiety. 2.  Continue Tegretol 200 mg p.o. twice daily for mood stability. 3.  Continue Risperdal 4 mg p.o. nightly and 2 mg p.o. daily for mood stability and sleep. 4.  Continue sliding scale for diabetes. 5.  Continue NovoLog insulin 4 units subcu 3 times daily AC for diabetes. 6.  Increase detemir insulin to 20 units subcu daily for diabetes. 7.  Continue lisinopril 10 mg p.o. daily for hypertension and renal protection. 8.  Get Tegretol level, CBC with differential and liver function enzymes in a.m. tomorrow.  9.  Disposition planning-in progress.  Antonieta Pert, MD 06/14/2018, 11:14 AM

## 2018-06-14 NOTE — Plan of Care (Signed)
  Problem: Coping: Goal: Ability to verbalize frustrations and anger appropriately will improve Outcome: Progressing Goal: Ability to demonstrate self-control will improve Outcome: Progressing   Problem: Coping: Goal: Ability to demonstrate self-control will improve Outcome: Progressing   D: Pt alert and oriented on the unit. Pt engaging with RN staff and other pts. Pt denies SI/HI, A/VH. Pt slept on the bed in her room for most of the day and did not attend any groups or unit activities. Pt went off unit for meals. Pt rated her depression, hopelessness, and anxiety all a 0. Pt's goal for today is "to get beeter and get out of here." Pt is pleasant and cooperative. A: Education, support and encouragement provided, q15 minute safety checks remain in effect. Medications administered per MD orders. R: No reactions/side effects to medicine noted. Pt denies any concerns at this time, and verbally contracts for safety. Pt ambulating on the unit with no issues. Pt remains safe on and off the unit.

## 2018-06-14 NOTE — Progress Notes (Signed)
Patient reports that she has vomited 7 times since 5 pm. Some vomiting episodes have been witnessed by nursing. Reports epigastric pain. Denies fever, chills, aches, cough, diarrhea, SOB, chest pain. On exam patient denies any abdominal tenderness upon palpation. Patient received zofran earlier to no avail. Will order phenergan 25 mg IM.

## 2018-06-14 NOTE — [facility] Group Notes (Signed)
[facility] LCSW Group Therapy Note  Date/Time:  06/14/2018  11:00AM-12:00PM  Type of Therapy and Topic:  Group Therapy:  Music and Mood  Participation Level:  Did Not Attend   Description of Group: In this process group, members listened to a variety of genres of music and identified that different types of music evoke different responses.  Patients were encouraged to identify music that was soothing for them and music that was energizing for them.  Patients discussed how this knowledge can help with wellness and recovery in various ways including managing depression and anxiety as well as encouraging healthy sleep habits.    Therapeutic Goals: 1. Patients will explore the impact of different varieties of music on mood 2. Patients will verbalize the thoughts they have when listening to different types of music 3. Patients will identify music that is soothing to them as well as music that is energizing to them 4. Patients will discuss how to use this knowledge to assist in maintaining wellness and recovery 5. Patients will explore the use of music as a coping skill  Summary of Patient Progress:  N/A  Therapeutic Modalities: Solution Focused Brief Therapy Activity   Ambrose Mantle, LCSW

## 2018-06-15 LAB — GLUCOSE, CAPILLARY
Glucose-Capillary: 137 mg/dL — ABNORMAL HIGH (ref 70–99)
Glucose-Capillary: 205 mg/dL — ABNORMAL HIGH (ref 70–99)

## 2018-06-15 MED ORDER — CARBAMAZEPINE 200 MG PO TABS: 200.0000 mg | ORAL_TABLET | Freq: Two times a day (BID) | ORAL | 2 refills | Status: DC

## 2018-06-15 MED ORDER — RISPERIDONE 2 MG PO TABS: ORAL_TABLET | ORAL | 2 refills | Status: DC

## 2018-06-15 MED ORDER — BENZTROPINE MESYLATE 0.5 MG PO TABS: 0.5000 mg | ORAL_TABLET | Freq: Two times a day (BID) | ORAL | 2 refills | Status: DC

## 2018-06-15 MED ORDER — TRAZODONE HCL 100 MG PO TABS: 200.0000 mg | ORAL_TABLET | Freq: Every day | ORAL | 2 refills | Status: DC

## 2018-06-15 NOTE — Discharge Summary (Signed)
Physician Discharge Summary Note  Patient:  [name] [name] is an 48 y.o., female MRN:  [id number] DOB:  [birth year] Patient phone:  [phone] (home)  Patient address:   [address] [place] [address],  Total Time spent with patient: 30 minutes  Date of Admission:  06/09/2018 Date of Discharge: 06/15/18  Reason for Admission:    [name] [name] is 48 years of age she is known to have a bipolar condition and was last admitted here in March 2017 with a very similar presentation, treated at that time with Zyprexa and lamotrigine, and since that time and had some medical issues to include an episode of hyponatremia that was symptomatic, and episode of DKA related to her type 2 diabetes, and this admission and petition for involuntary commitment were prompted by a decline in her functioning, during the interview she was seeking benzodiazepines, she has a history of abusing amphetamines and benzodiazepines but this drug screen was negative.  She was very hypomanic giddy and giggling on her assessment.  She has been noncompliant with medications, disorganized in thought and behavior, and again needed inpatient stabilization.  Principal Problem: Bipolar manic with psychosis Discharge Diagnoses: Active Problems:   Bipolar I disorder, single manic episode, severe, with psychosis ([place])   Bipolar disorder ([place]) Past Psychiatric History: Discussed above and in the HPI  Past Medical History:  Past Medical History:  Diagnosis Date  . Anxiety disorder   . Asthma   . Bipolar affective disorder ([place])   . Depression   . Diabetes mellitus   . Migraine   . Schizophrenia [facility])     Past Surgical History:  Procedure Laterality Date  . KNEE ARTHROSCOPY     x 2  . KNEE SURGERY Left    Incision made and knee cleaned out  . TUBAL LIGATION     Family History:  Family History  Problem Relation Age of Onset  . Bipolar disorder Mother   . Hypertension Father   . Diabetes Father    Family  Psychiatric  History: Unknown to patient Social History:  Social History   Substance and Sexual Activity  Alcohol Use Not Currently   Comment: occasionally     Social History   Substance and Sexual Activity  Drug Use No    Social History   Socioeconomic History  . Marital status: Divorced    Spouse name: Not on file  . Number of children: Not on file  . Years of education: Not on file  . Highest education level: Not on file  Occupational History  . Not on file  Social Needs  . Financial resource strain: Not on file  . Food insecurity:    Worry: Not on file    Inability: Not on file  . Transportation needs:    Medical: Not on file    Non-medical: Not on file  Tobacco Use  . Smoking status: Current Every Day Smoker    Packs/day: 1.00    Years: 29.00    Pack years: 29.00    Types: Cigarettes  . Smokeless tobacco: Never Used  Substance and Sexual Activity  . Alcohol use: Not Currently    Comment: occasionally  . Drug use: No  . Sexual activity: Yes    Birth control/protection: Surgical  Lifestyle  . Physical activity:    Days per week: Not on file    Minutes per session: Not on file  . Stress: Not on file  Relationships  . Social connections:  Talks on phone: Not on file    Gets together: Not on file    Attends religious service: Not on file    Active member of club or organization: Not on file    Attends meetings of clubs or organizations: Not on file    Relationship status: Not on file  Other Topics Concern  . Not on file  Social History Narrative   Patient lives in [place] with her 2 kids- 71 in 8 years old.   She has total 4 kids.   She is a single mom.          Hospital Course:   As discussed her mania was mainly marked by disorganized thought and behavior, some giddiness, drug-seeking behaviors and again was quite disorganized.  She at times was seeking benzodiazepines even seeking marijuana as a prescription on 1 interview but she was given  Risperdal given reality based therapy and engaged in milieu and cognitive-based therapies as well.  By the date of the 6 we felt she had recalibrated to what was thought to be her baseline mental status-I found her on this date to be alert oriented to person place time day and situation, coherent and goal-directed in her thoughts, making normal conversation with family members on the phone about time of pickup and so forth.  Again no thoughts of harming self or others no acute mania and the medications include carbamazepine as a mood stabilizer rather than lamotrigine and Risperdal rather than Zyprexa, we thought we get a faster response with Risperdal while hospitalized.  She has no EPS or TD no involuntary movements aims score 0  Physical Findings: AIMS: Facial and Oral Movements Muscles of Facial Expression: None, normal Lips and Perioral Area: None, normal Jaw: None, normal Tongue: None, normal,Extremity Movements Upper (arms, wrists, hands, fingers): None, normal Lower (legs, knees, ankles, toes): None, normal, Trunk Movements Neck, shoulders, hips: None, normal, Overall Severity Severity of abnormal movements (highest score from questions above): None, normal Incapacitation due to abnormal movements: None, normal Patient's awareness of abnormal movements (rate only patient's report): No Awareness, Dental Status Current problems with teeth and/or dentures?: No Does patient usually wear dentures?: No  CIWA:  CIWA-Ar Total: 0 COWS:  COWS Total Score: 2  Musculoskeletal: Strength & Muscle Tone: within normal limits Gait & Station: normal Patient leans: N/A  Psychiatric Specialty Exam: Physical Exam  ROS  Blood pressure 125/87, pulse (!) 118, temperature 98 F (36.7 C), temperature source Oral, resp. rate 18.There is no height or weight on file to calculate BMI.  General Appearance: Casual  Eye Contact:  Good  Speech:  Clear and Coherent  Volume:  Normal  Mood:  Euthymic  Affect:   Congruent  Thought Process:  Linear  Orientation:  Full (Time, Place, and Person)  Thought Content:  Logical  Suicidal Thoughts:  No  Homicidal Thoughts:  No  Memory:  Immediate;   Good  Judgement:  Good  Insight:  Good  Psychomotor Activity:  Normal  Concentration:  Concentration: Good  Recall:  Good  Fund of Knowledge:  Good  Language:  Good  Akathisia:  Negative  Handed:  Right  AIMS (if indicated):     Assets:  Physical Health Resilience  ADL's:  Intact  Cognition:  WNL  Sleep:  Number of Hours: 5     Have you used any form of tobacco in the last 30 days? (Cigarettes, Smokeless Tobacco, Cigars, and/or Pipes): Patient Refused Screening  Has this patient used any form  of tobacco in the last 30 days? (Cigarettes, Smokeless Tobacco, Cigars, and/or Pipes) Yes, No  Blood Alcohol level:  Lab Results  Component Value Date   ETH <10 06/08/2018   ETH <10 16/03/9603    Metabolic Disorder Labs:  Lab Results  Component Value Date   HGBA1C 7.2 (H) 06/10/2018   MPG 159.94 06/10/2018   MPG 274.74 01/04/2018   Lab Results  Component Value Date   PROLACTIN 10.6 08/31/2015   Lab Results  Component Value Date   CHOL 149 06/10/2018   TRIG 125 06/10/2018   HDL 51 06/10/2018   CHOLHDL 2.9 06/10/2018   VLDL 25 06/10/2018   LDLCALC 73 06/10/2018   LDLCALC 83 08/31/2015    See Psychiatric Specialty Exam and Suicide Risk Assessment completed by Attending Physician prior to discharge.  Discharge destination:  Home  Is patient on multiple antipsychotic therapies at discharge:  No   Has Patient had three or more failed trials of antipsychotic monotherapy by history:  No  Recommended Plan for Multiple Antipsychotic Therapies: NA   Allergies as of 06/15/2018      Reactions   Tramadol Nausea Only   "feeling like I'm on fire"   Other Other (See Comments)   Pt reports being allergic to nicotine patches only.  She can use the gum   Nicotine Rash   Patient states she is  allergic to the Nicotine patch and they cause her to break out in a rash.  She states she can smoke cigarettes.      Medication List    STOP taking these medications   acetaminophen 500 MG tablet Commonly known as:  TYLENOL   FLUoxetine 10 MG capsule Commonly known as:  PROZAC     TAKE these medications     Indication  albuterol 108 (90 Base) MCG/ACT inhaler Commonly known as:  PROVENTIL HFA;VENTOLIN HFA Inhale 2 puffs into the lungs every 6 (six) hours as needed for wheezing or shortness of breath.  Indication:  Asthma   benztropine 0.5 MG tablet Commonly known as:  COGENTIN Take 1 tablet (0.5 mg total) by mouth 2 (two) times daily.  Indication:  Extrapyramidal Reaction caused by Medications   blood glucose meter kit and supplies Dispense based on patient and insurance preference. Use up to four times daily as directed. (FOR ICD-10 E10.9, E11.9).  Indication:  Deficiency of Folic Acid   carbamazepine 200 MG tablet Commonly known as:  TEGRETOL Take 1 tablet (200 mg total) by mouth 2 (two) times daily.  Indication:  Manic-Depression   Fluticasone-Salmeterol 250-50 MCG/DOSE Aepb Commonly known as:  ADVAIR Inhale 1 puff into the lungs 2 (two) times daily.  Indication:  Asthma   insulin aspart 100 UNIT/ML FlexPen Commonly known as:  NOVOLOG FLEXPEN Inject 3 Units into the skin 3 (three) times daily with meals. What changed:  how much to take  Indication:  High Blood Sugar, Type 2 Diabetes   Insulin Detemir 100 UNIT/ML Pen Commonly known as:  LEVEMIR FLEXTOUCH Inject 15 Units into the skin daily.  Indication:  Type 2 Diabetes   lisinopril 10 MG tablet Commonly known as:  PRINIVIL,ZESTRIL Take 1 tablet (10 mg total) by mouth daily.  Indication:  High Blood Pressure Disorder   Pen Needles 3/16" 31G X 5 MM Misc 15 Units by Does not apply route daily.  Indication:  Abnormal BUN   risperiDONE 2 MG tablet Commonly known as:  RISPERDAL 1 in am 2 at h s  Indication:   Manic-Depression  traZODone 100 MG tablet Commonly known as:  DESYREL Take 2 tablets (200 mg total) by mouth at bedtime.  Indication:  Anxiety Disorder      Follow-up Information    [facility] Follow up.   Why:  Your next medication management appointment is  Contact information: [address] [place] [address] Phone: [phone] Fax: [phone])           Follow-up recommendations:  Activity:  full  Comments: Medications printed  SignedJohnn Hai, MD 06/15/2018, 11:16 AM

## 2018-06-15 NOTE — Plan of Care (Signed)
  Problem: Education: Goal: Knowledge of [facility] Education information/materials will improve Outcome: Adequate for Discharge Goal: Emotional status will improve 06/15/2018 1431 by Dewayne Shorter, RN Outcome: Adequate for Discharge 06/15/2018 0947 by Dewayne Shorter, RN Outcome: Not Progressing Goal: Mental status will improve 06/15/2018 1431 by Dewayne Shorter, RN Outcome: Adequate for Discharge 06/15/2018 0947 by Dewayne Shorter, RN Outcome: Not Progressing Goal: Verbalization of understanding the information provided will improve 06/15/2018 1431 by Dewayne Shorter, RN Outcome: Adequate for Discharge 06/15/2018 0947 by Dewayne Shorter, RN Outcome: Progressing   Problem: Activity: Goal: Interest or engagement in activities will improve 06/15/2018 1431 by Dewayne Shorter, RN Outcome: Adequate for Discharge 06/15/2018 0947 by Dewayne Shorter, RN Outcome: Progressing Goal: Sleeping patterns will improve 06/15/2018 1431 by Dewayne Shorter, RN Outcome: Adequate for Discharge 06/15/2018 0947 by Dewayne Shorter, RN Outcome: Progressing   Problem: Coping: Goal: Ability to verbalize frustrations and anger appropriately will improve 06/15/2018 1431 by Dewayne Shorter, RN Outcome: Adequate for Discharge 06/15/2018 0947 by Dewayne Shorter, RN Outcome: Progressing Goal: Ability to demonstrate self-control will improve Outcome: Adequate for Discharge   Problem: Health Behavior/Discharge Planning: Goal: Identification of resources available to assist in meeting health care needs will improve Outcome: Adequate for Discharge Goal: Compliance with treatment plan for underlying cause of condition will improve Outcome: Adequate for Discharge   Problem: Physical Regulation: Goal: Ability to maintain clinical measurements within normal limits will improve Outcome: Adequate for Discharge   Problem: Safety: Goal: Periods of time without injury will increase Outcome: Adequate for Discharge    Problem: Activity: Goal: Will identify at least one activity in which they can participate Outcome: Adequate for Discharge   Problem: Coping: Goal: Ability to identify and develop effective coping behavior will improve Outcome: Adequate for Discharge Goal: Ability to interact with others will improve Outcome: Adequate for Discharge Goal: Demonstration of participation in decision-making regarding own care will improve Outcome: Adequate for Discharge Goal: Ability to use eye contact when communicating with others will improve Outcome: Adequate for Discharge   Problem: Health Behavior/Discharge Planning: Goal: Identification of resources available to assist in meeting health care needs will improve Outcome: Adequate for Discharge   Problem: Self-Concept: Goal: Will verbalize positive feelings about self Outcome: Adequate for Discharge   Problem: Education: Goal: Ability to make informed decisions regarding treatment will improve Outcome: Adequate for Discharge   Problem: Coping: Goal: Coping ability will improve Outcome: Adequate for Discharge   Problem: Health Behavior/Discharge Planning: Goal: Identification of resources available to assist in meeting health care needs will improve Outcome: Adequate for Discharge   Problem: Medication: Goal: Compliance with prescribed medication regimen will improve Outcome: Adequate for Discharge   Problem: Self-Concept: Goal: Ability to disclose and discuss suicidal ideas will improve Outcome: Adequate for Discharge Goal: Will verbalize positive feelings about self Outcome: Adequate for Discharge

## 2018-06-15 NOTE — Progress Notes (Signed)
Discharge note: Patient reviewed discharge paperwork with RN including prescriptions, follow up appointments, and lab work. Patient given the opportunity to ask questions. All concerns were addressed. All belongings were returned to patient. Denied SI/HI/AVH. Patient thanked staff for their care while at the hospital.  Patient was discharged to lobby where her son was waiting to pick her up. 

## 2018-06-15 NOTE — Progress Notes (Signed)
Patient ID: [name] [name], female   DOB: 05-11-1971, 48 y.o.   MRN: [id number] Pt refused lab is morning

## 2018-06-15 NOTE — Progress Notes (Signed)
  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  Patient to Follow up at: Follow-up Information    [facility] Follow up.   Why:  Please follow up. Contact information: [address] [place] [address] Phone: [phone] Fax: [phone]        [facility] Follow up.   Why:  [facility] will follow up with you by phone to confirm an intake appointment.  Contact information: [address] [place] [address] [phone]           Next level of care provider has access to [facility] Link:no  Safety Planning and Suicide Prevention discussed: Yes,  with patient  Have you used any form of tobacco in the last 30 days? (Cigarettes, Smokeless Tobacco, Cigars, and/or Pipes): Patient Refused Screening  Has patient been referred to the Quitline?: Patient refused referral  Patient has been referred for addiction treatment: Yes  Darreld Mclean, LCSWA 06/15/2018, 11:59 AM

## 2018-06-15 NOTE — [facility] Suicide Risk Assessment (Signed)
[facility] Discharge Suicide Risk Assessment   Principal Problem: Bipolar manic with psychosis Discharge Diagnoses: Active Problems:   Bipolar I disorder, single manic episode, severe, with psychosis ([facility])   Bipolar disorder ([facility])   Total Time spent with patient: 30 minutes  Current mental status exam patient is alert and oriented to person place time situation speech is clear and coherent thoughts are logical no thoughts of harming self or others no acute mania or psychosis noted or discerned Mental Status Per Nursing Assessment::   On Admission:  Suicidal ideation indicated by others, Plan includes specific time, place, or method  Demographic Factors:  Caucasian  Loss Factors: Decrease in vocational status  Historical Factors: Impulsivity  Risk Reduction Factors:   Religious beliefs about death  Continued Clinical Symptoms:  Alcohol/Substance Abuse/Dependencies  Cognitive Features That Contribute To Risk:  None    Suicide Risk:  Minimal: No identifiable suicidal ideation.  Patients presenting with no risk factors but with morbid ruminations; may be classified as minimal risk based on the severity of the depressive symptoms  Follow-up Information    [facility] Follow up.   Why:  Your next medication management appointment is  Contact information: [address] [place] [address] Phone: [phone] Fax: [phone]           Plan Of Care/Follow-up recommendations:  Activity:  full  Alvin Rubano, MD 06/15/2018, 11:10 AM

## 2018-06-15 NOTE — Plan of Care (Signed)
  Problem: Education: Goal: Verbalization of understanding the information provided will improve Outcome: Progressing   Problem: Activity: Goal: Interest or engagement in activities will improve Outcome: Progressing Goal: Sleeping patterns will improve Outcome: Progressing   Problem: Coping: Goal: Ability to verbalize frustrations and anger appropriately will improve Outcome: Progressing   Problem: Education: Goal: Emotional status will improve Outcome: Not Progressing Goal: Mental status will improve Outcome: Not Progressing

## 2018-06-15 NOTE — Tx Team (Signed)
Interdisciplinary Treatment and Diagnostic Plan Update  06/15/2018 Time of Session: 9:00am [name] [name] MRN: [id number]  Principal Diagnosis: <principal problem not specified>  Secondary Diagnoses: Active Problems:   Bipolar I disorder, single manic episode, severe, with psychosis ([place])   Bipolar disorder ([place])   Current Medications:  Current Facility-Administered Medications  Medication Dose Route Frequency Provider Last Rate Last Dose  . acetaminophen (TYLENOL) tablet 650 mg  650 mg Oral Q6H PRN Suella Broad, FNP   650 mg at 06/15/18 0824  . albuterol (PROVENTIL HFA;VENTOLIN HFA) 108 (90 Base) MCG/ACT inhaler 2 puff  2 puff Inhalation Q6H PRN Suella Broad, FNP      . alum & mag hydroxide-simeth (MAALOX/MYLANTA) 200-200-20 MG/5ML suspension 30 mL  30 mL Oral Q4H PRN Suella Broad, FNP   30 mL at 06/14/18 1644  . carbamazepine (TEGRETOL) tablet 200 mg  200 mg Oral BID Johnn Hai, MD   200 mg at 06/15/18 8756  . diclofenac sodium (VOLTAREN) 1 % transdermal gel 2 g  2 g Topical QID PRN Lindell Spar I, NP   2 g at 06/15/18 0823  . haloperidol (HALDOL) tablet 5 mg  5 mg Oral Q6H PRN Johnn Hai, MD   5 mg at 06/12/18 2115   Or  . haloperidol lactate (HALDOL) injection 5 mg  5 mg Intramuscular Q6H PRN Johnn Hai, MD      . haloperidol lactate (HALDOL) injection 5 mg  5 mg Intramuscular Once Suella Broad, FNP      . hydrOXYzine (ATARAX/VISTARIL) tablet 50 mg  50 mg Oral TID PRN Johnn Hai, MD   50 mg at 06/13/18 1623  . ibuprofen (ADVIL,MOTRIN) tablet 600 mg  600 mg Oral Q6H PRN Lindon Romp A, NP   600 mg at 06/14/18 2320  . insulin aspart (novoLOG) injection 0-15 Units  0-15 Units Subcutaneous TID WC Lindell Spar I, NP   2 Units at 06/15/18 0736  . insulin aspart (novoLOG) injection 4 Units  4 Units Subcutaneous TID WC Lindell Spar I, NP   4 Units at 06/14/18 1721  . insulin detemir (LEVEMIR) injection 20 Units  20 Units Subcutaneous Daily  Sharma Covert, MD   20 Units at 06/15/18 0825  . lisinopril (PRINIVIL,ZESTRIL) tablet 10 mg  10 mg Oral Daily Suella Broad, FNP   10 mg at 06/15/18 4332  . magnesium hydroxide (MILK OF MAGNESIA) suspension 30 mL  30 mL Oral Daily PRN Starkes-Perry, Gayland Curry, FNP      . mometasone-formoterol (DULERA) 200-5 MCG/ACT inhaler 2 puff  2 puff Inhalation BID Suella Broad, FNP   2 puff at 06/15/18 [id number]  . multivitamin with minerals tablet 1 tablet  1 tablet Oral Daily Suella Broad, FNP   1 tablet at 06/15/18 8416  . nicotine polacrilex (NICORETTE) gum 2 mg  2 mg Oral PRN Sharma Covert, MD   2 mg at 06/15/18 6063  . ondansetron (ZOFRAN-ODT) disintegrating tablet 4 mg  4 mg Oral Q6H PRN Lindon Romp A, NP      . risperiDONE (RISPERDAL) tablet 2 mg  2 mg Oral Daily Johnn Hai, MD   2 mg at 06/15/18 0160  . risperiDONE (RISPERDAL) tablet 4 mg  4 mg Oral QHS Johnn Hai, MD   4 mg at 06/14/18 2348  . sertraline (ZOLOFT) tablet 25 mg  25 mg Oral Daily Sharma Covert, MD   25 mg at 06/15/18 1093  . traZODone (DESYREL) tablet 200 mg  200 mg Oral QHS Johnn Hai, MD   200 mg at 06/14/18 2349   PTA Medications: Medications Prior to Admission  Medication Sig Dispense Refill Last Dose  . acetaminophen (TYLENOL) 500 MG tablet Take 1,000 mg by mouth every 6 (six) hours as needed for mild pain or headache.   06/07/2018 at Unknown time  . albuterol (PROVENTIL HFA;VENTOLIN HFA) 108 (90 Base) MCG/ACT inhaler Inhale 2 puffs into the lungs every 6 (six) hours as needed for wheezing or shortness of breath. 1 Inhaler 0 Past Week at Unknown time  . blood glucose meter kit and supplies Dispense based on patient and insurance preference. Use up to four times daily as directed. (FOR ICD-10 E10.9, E11.9). 1 each 0   . FLUoxetine (PROZAC) 10 MG capsule Take 1 capsule (10 mg total) by mouth daily. (Patient not taking: Reported on 04/05/2018) 30 capsule 0 Not Taking at Unknown time  .  Fluticasone-Salmeterol (ADVAIR) 250-50 MCG/DOSE AEPB Inhale 1 puff into the lungs 2 (two) times daily.   unknown  . insulin aspart (NOVOLOG FLEXPEN) 100 UNIT/ML FlexPen Inject 3 Units into the skin 3 (three) times daily with meals. (Patient taking differently: Inject 4 Units into the skin 3 (three) times daily with meals. ) 15 mL 0 Past Week at Unknown time  . Insulin Detemir (LEVEMIR FLEXTOUCH) 100 UNIT/ML Pen Inject 15 Units into the skin daily. (Patient not taking: Reported on 06/08/2018) 15 mL 0 Not Taking at Unknown time  . Insulin Pen Needle (PEN NEEDLES 3/16") 31G X 5 MM MISC 15 Units by Does not apply route daily. 100 each 0   . lisinopril (PRINIVIL,ZESTRIL) 10 MG tablet Take 1 tablet (10 mg total) by mouth daily. (Patient not taking: Reported on 04/05/2018) 30 tablet 0 Not Taking at Unknown time    Patient Stressors: Marital or family conflict Medication change or noncompliance  Patient Strengths: General fund of knowledge Supportive family/friends  Treatment Modalities: Medication Management, Group therapy, Case management,  1 to 1 session with clinician, Psychoeducation, Recreational therapy.   Physician Treatment Plan for Primary Diagnosis: <principal problem not specified> Long Term Goal(s): Improvement in symptoms so as ready for discharge Improvement in symptoms so as ready for discharge   Short Term Goals: Ability to verbalize feelings will improve Ability to disclose and discuss suicidal ideas Ability to demonstrate self-control will improve Ability to identify and develop effective coping behaviors will improve Ability to maintain clinical measurements within normal limits will improve  Medication Management: Evaluate patient's response, side effects, and tolerance of medication regimen.  Therapeutic Interventions: 1 to 1 sessions, Unit Group sessions and Medication administration.  Evaluation of Outcomes: Progressing  Physician Treatment Plan for Secondary  Diagnosis: Active Problems:   Bipolar I disorder, single manic episode, severe, with psychosis ([place])   Bipolar disorder ([place])  Long Term Goal(s): Improvement in symptoms so as ready for discharge Improvement in symptoms so as ready for discharge   Short Term Goals: Ability to verbalize feelings will improve Ability to disclose and discuss suicidal ideas Ability to demonstrate self-control will improve Ability to identify and develop effective coping behaviors will improve Ability to maintain clinical measurements within normal limits will improve     Medication Management: Evaluate patient's response, side effects, and tolerance of medication regimen.  Therapeutic Interventions: 1 to 1 sessions, Unit Group sessions and Medication administration.  Evaluation of Outcomes: Progressing   RN Treatment Plan for Primary Diagnosis: <principal problem not specified> Long Term Goal(s): Knowledge of disease and therapeutic regimen  to maintain health will improve  Short Term Goals: Ability to remain free from injury will improve, Ability to verbalize frustration and anger appropriately will improve, Ability to verbalize feelings will improve, Ability to disclose and discuss suicidal ideas, Ability to identify and develop effective coping behaviors will improve and Compliance with prescribed medications will improve  Medication Management: RN will administer medications as ordered by provider, will assess and evaluate patient's response and provide education to patient for prescribed medication. RN will report any adverse and/or side effects to prescribing provider.  Therapeutic Interventions: 1 on 1 counseling sessions, Psychoeducation, Medication administration, Evaluate responses to treatment, Monitor vital signs and CBGs as ordered, Perform/monitor CIWA, COWS, AIMS and Fall Risk screenings as ordered, Perform wound care treatments as ordered.  Evaluation of Outcomes: Progressing   LCSW  Treatment Plan for Primary Diagnosis: <principal problem not specified> Long Term Goal(s): Safe transition to appropriate next level of care at discharge, Engage patient in therapeutic group addressing interpersonal concerns.  Short Term Goals: Engage patient in aftercare planning with referrals and resources, Increase social support, Increase ability to appropriately verbalize feelings, Increase emotional regulation, Identify triggers associated with mental health/substance abuse issues and Increase skills for wellness and recovery  Therapeutic Interventions: Assess for all discharge needs, 1 to 1 time with Social worker, Explore available resources and support systems, Assess for adequacy in community support network, Educate family and significant other(s) on suicide prevention, Complete Psychosocial Assessment, Interpersonal group therapy.  Evaluation of Outcomes: Progressing   Progress in Treatment: Attending groups: Yes.  Participating in groups: Yes. Taking medication as prescribed: Yes. Toleration medication: Yes. Family/Significant other contact made: No, will contact:  Declined consents, SPE completed with patient. Patient understands diagnosis: Yes. Discussing patient identified problems/goals with staff: No. Medical problems stabilized or resolved: No.  Denies suicidal/homicidal ideation: Yes. Issues/concerns per patient self-inventory: No.   New problem(s) identified: No, Describe:  CSW continuing to assess  New Short Term/Long Term Goal(s): medication management for mood stabilization; elimination of SI thoughts; development of comprehensive mental wellness/sobriety plan.  Patient Goals: Eliminate SI, develop more effective coping skills  Discharge Plan or Barriers: Patient expected to discharge home and follow up with outpatient resources. [place] pamphlet, Mobile Crisis information, and AA/NA information provided to patient for additional community support and resources.    Reason for Continuation of Hospitalization: Anxiety Depression Medication stabilization  Estimated Length of Stay: 1 day  Attendees: Patient: 06/15/2018 8:37 AM  Physician: Dr.Farah 06/15/2018 8:37 AM  Nursing:  06/15/2018 8:37 AM  RN Care Manager: 06/15/2018 8:37 AM  Social Worker: Stephanie Acre, [place] 06/15/2018 8:37 AM  Recreational Therapist:  06/15/2018 8:37 AM  Other:  06/15/2018 8:37 AM  Other:  06/15/2018 8:37 AM  Other: 06/15/2018 8:37 AM    Scribe for Treatment Team: Joellen Jersey, [address] 06/15/2018 8:37 AM

## 2018-06-15 NOTE — Progress Notes (Signed)
Patient ID: [name] [name], female   DOB: 1971-04-04, 49 y.o.   MRN: [id number] D: Patient c/o N/V. Pt vomited three different times within an hour. NP notified and new orders for Zofran given. Without relief pt continue vomiting and new order of phenergan IM given. Pt reports relief from vomiting but still c/o nausea. Pt denies SI/HI/AVH. Cooperative with assessment.  A: Medications administered as prescribed. Support and encouragement provided as needed. R: Patient remains safe and complaint with medications.

## 2018-06-15 NOTE — Progress Notes (Signed)
Recreation Therapy Notes  Date: 1.6.20 Time: 1000 Location: 500 Hall Dayroom  Group Topic: Coping Skills  Goal Area(s) Addresses:  Pt will be able to identify positive coping skills. Pt will be able to identify benefits of using positive coping skills. Pt will be able to identify benefits of using coping skills post d/c.  Behavioral Response: Engaged  Intervention: Worksheet  Activity: Coping Skills A to Z.  Patients worked together to Licensed conveyancer for each letter of the alphabet.   Education: Pharmacologist, Building control surveyor.   Education Outcome: Acknowledges understanding/In group clarification offered/Needs additional education.   Clinical Observations/Feedback: Pt was active and worked well with peers.  Pt was appropriate during group session.  Pt stated some of her coping skills were music, relaxing, playing with a yo-yo, going to the ocean and eating.    Caroll Rancher, LRT/CTRS     Caroll Rancher A 06/15/2018 11:27 AM

## 2018-06-24 ENCOUNTER — Encounter (HOSPITAL_COMMUNITY): Payer: Self-pay | Admitting: Emergency Medicine

## 2018-06-24 ENCOUNTER — Emergency Department (HOSPITAL_COMMUNITY)
Admission: EM | Admit: 2018-06-24 | Discharge: 2018-06-24 | Disposition: A | Discharge status: Home / Self Care | Payer: Medicaid Other | Attending: Emergency Medicine | Admitting: Emergency Medicine

## 2018-06-24 ENCOUNTER — Other Ambulatory Visit: Payer: Self-pay

## 2018-06-24 DIAGNOSIS — Z5321 Procedure and treatment not carried out due to patient leaving prior to being seen by health care provider: Secondary | ICD-10-CM | POA: Diagnosis not present

## 2018-06-24 DIAGNOSIS — R109 Unspecified abdominal pain: Secondary | ICD-10-CM | POA: Diagnosis not present

## 2018-06-24 LAB — COMPREHENSIVE METABOLIC PANEL
ALT: 29 U/L (ref 0–44)
AST: 17 U/L (ref 15–41)
Albumin: 4.2 g/dL (ref 3.5–5.0)
Alkaline Phosphatase: 83 U/L (ref 38–126)
Anion gap: 10 (ref 5–15)
BUN: 20 mg/dL (ref 6–20)
CO2: 27 mmol/L (ref 22–32)
Calcium: 9.4 mg/dL (ref 8.9–10.3)
Chloride: 101 mmol/L (ref 98–111)
Creatinine, Ser: 0.96 mg/dL (ref 0.44–1.00)
GFR calc Af Amer: >60 mL/min (ref >60)
GFR calc non Af Amer: >60 mL/min (ref >60)
Glucose, Bld: 130 mg/dL — ABNORMAL HIGH (ref 70–99)
POTASSIUM: 4 mmol/L (ref 3.5–5.1)
Sodium: 138 mmol/L (ref 135–145)
Total Bilirubin: 0.4 mg/dL (ref 0.3–1.2)
Total Protein: 7.2 g/dL (ref 6.5–8.1)

## 2018-06-24 LAB — CBC
HCT: 39.8 % (ref 36.0–46.0)
Hemoglobin: 12.6 g/dL (ref 12.0–15.0)
MCH: 29.7 pg (ref 26.0–34.0)
MCHC: 31.7 g/dL (ref 30.0–36.0)
MCV: 93.9 fL (ref 80.0–100.0)
Platelets: 284 10*3/uL (ref 150–400)
RBC: 4.24 MIL/uL (ref 3.87–5.11)
RDW: 13.2 % (ref 11.5–15.5)
WBC: 6.4 10*3/uL (ref 4.0–10.5)
nRBC: 0 % (ref 0.0–0.2)

## 2018-06-24 LAB — I-STAT BETA HCG BLOOD, ED (MC, WL, AP ONLY): I-stat hCG, quantitative: <5 m[IU]/mL (ref <5)

## 2018-06-24 LAB — LIPASE, BLOOD: Lipase: 29 U/L (ref 11–51)

## 2018-06-24 MED ORDER — SODIUM CHLORIDE 0.9% FLUSH: 3.0000 mL | Freq: Once | INTRAVENOUS | Status: DC

## 2018-06-24 NOTE — ED Notes (Signed)
Called for vital recheck with no answer. 

## 2018-06-24 NOTE — ED Notes (Signed)
Pt sts she is just going to start walking home because she cant wait any longer

## 2018-06-24 NOTE — ED Notes (Signed)
Pt handed in her labels and sts she is leaving 

## 2018-06-24 NOTE — ED Triage Notes (Signed)
Pt complaint of generalized abdominal pain for a few hours; associated n/v; and "hole on my tongue for 7 years." Denies CP or SOB.

## 2018-10-08 ENCOUNTER — Emergency Department (HOSPITAL_COMMUNITY)
Admission: EM | Admit: 2018-10-08 | Discharge: 2018-10-08 | Disposition: A | Discharge status: Home / Self Care | Payer: Medicaid Other | Attending: Emergency Medicine | Admitting: Emergency Medicine

## 2018-10-08 ENCOUNTER — Encounter (HOSPITAL_COMMUNITY): Payer: Self-pay | Admitting: Emergency Medicine

## 2018-10-08 ENCOUNTER — Emergency Department (HOSPITAL_COMMUNITY): Payer: Medicaid Other

## 2018-10-08 DIAGNOSIS — R0981 Nasal congestion: Secondary | ICD-10-CM | POA: Insufficient documentation

## 2018-10-08 DIAGNOSIS — X58XXXA Exposure to other specified factors, initial encounter: Secondary | ICD-10-CM | POA: Diagnosis not present

## 2018-10-08 DIAGNOSIS — R070 Pain in throat: Secondary | ICD-10-CM | POA: Insufficient documentation

## 2018-10-08 DIAGNOSIS — T189XXA Foreign body of alimentary tract, part unspecified, initial encounter: Secondary | ICD-10-CM | POA: Diagnosis not present

## 2018-10-08 DIAGNOSIS — Y939 Activity, unspecified: Secondary | ICD-10-CM | POA: Insufficient documentation

## 2018-10-08 DIAGNOSIS — I10 Essential (primary) hypertension: Secondary | ICD-10-CM | POA: Diagnosis not present

## 2018-10-08 DIAGNOSIS — E1165 Type 2 diabetes mellitus with hyperglycemia: Secondary | ICD-10-CM | POA: Insufficient documentation

## 2018-10-08 DIAGNOSIS — R05 Cough: Secondary | ICD-10-CM | POA: Diagnosis not present

## 2018-10-08 DIAGNOSIS — J069 Acute upper respiratory infection, unspecified: Secondary | ICD-10-CM | POA: Diagnosis not present

## 2018-10-08 DIAGNOSIS — Y929 Unspecified place or not applicable: Secondary | ICD-10-CM | POA: Insufficient documentation

## 2018-10-08 DIAGNOSIS — T383X6A Underdosing of insulin and oral hypoglycemic [antidiabetic] drugs, initial encounter: Secondary | ICD-10-CM | POA: Diagnosis not present

## 2018-10-08 DIAGNOSIS — F250 Schizoaffective disorder, bipolar type: Secondary | ICD-10-CM | POA: Diagnosis not present

## 2018-10-08 DIAGNOSIS — Z91128 Patient's intentional underdosing of medication regimen for other reason: Secondary | ICD-10-CM | POA: Insufficient documentation

## 2018-10-08 DIAGNOSIS — F17210 Nicotine dependence, cigarettes, uncomplicated: Secondary | ICD-10-CM | POA: Insufficient documentation

## 2018-10-08 DIAGNOSIS — Y999 Unspecified external cause status: Secondary | ICD-10-CM | POA: Insufficient documentation

## 2018-10-08 DIAGNOSIS — R739 Hyperglycemia, unspecified: Secondary | ICD-10-CM

## 2018-10-08 LAB — POCT I-STAT EG7
Acid-Base Excess: 2 mmol/L (ref 0.0–2.0)
Bicarbonate: 26.9 mmol/L (ref 20.0–28.0)
Calcium, Ion: 1.16 mmol/L (ref 1.15–1.40)
HCT: 38 % (ref 36.0–46.0)
Hemoglobin: 12.9 g/dL (ref 12.0–15.0)
O2 Saturation: 93 %
Potassium: 3.8 mmol/L (ref 3.5–5.1)
Sodium: 142 mmol/L (ref 135–145)
TCO2: 28 mmol/L (ref 22–32)
pCO2, Ven: 40.9 mmHg — ABNORMAL LOW (ref 44.0–60.0)
pH, Ven: 7.426 (ref 7.250–7.430)
pO2, Ven: 66 mmHg — ABNORMAL HIGH (ref 32.0–45.0)

## 2018-10-08 LAB — COMPREHENSIVE METABOLIC PANEL
ALT: 16 U/L (ref 0–44)
AST: 12 U/L — ABNORMAL LOW (ref 15–41)
Albumin: 3.7 g/dL (ref 3.5–5.0)
Alkaline Phosphatase: 83 U/L (ref 38–126)
Anion gap: 9 (ref 5–15)
BUN: 20 mg/dL (ref 6–20)
CO2: 25 mmol/L (ref 22–32)
Calcium: 9 mg/dL (ref 8.9–10.3)
Chloride: 108 mmol/L (ref 98–111)
Creatinine, Ser: 0.97 mg/dL (ref 0.44–1.00)
GFR calc Af Amer: >60 mL/min (ref >60)
GFR calc non Af Amer: >60 mL/min (ref >60)
Glucose, Bld: 210 mg/dL — ABNORMAL HIGH (ref 70–99)
Potassium: 3.7 mmol/L (ref 3.5–5.1)
Sodium: 142 mmol/L (ref 135–145)
Total Bilirubin: 0.4 mg/dL (ref 0.3–1.2)
Total Protein: 6.4 g/dL — ABNORMAL LOW (ref 6.5–8.1)

## 2018-10-08 LAB — CBC WITH DIFFERENTIAL/PLATELET
Abs Immature Granulocytes: 0.03 10*3/uL (ref 0.00–0.07)
Basophils Absolute: 0.1 10*3/uL (ref 0.0–0.1)
Basophils Relative: 1 %
Eosinophils Absolute: 0.4 10*3/uL (ref 0.0–0.5)
Eosinophils Relative: 4 %
HCT: 38.3 % (ref 36.0–46.0)
Hemoglobin: 12.7 g/dL (ref 12.0–15.0)
Immature Granulocytes: 0 %
Lymphocytes Relative: 25 %
Lymphs Abs: 2.3 10*3/uL (ref 0.7–4.0)
MCH: 29.7 pg (ref 26.0–34.0)
MCHC: 33.2 g/dL (ref 30.0–36.0)
MCV: 89.7 fL (ref 80.0–100.0)
Monocytes Absolute: 0.6 10*3/uL (ref 0.1–1.0)
Monocytes Relative: 6 %
Neutro Abs: 5.8 10*3/uL (ref 1.7–7.7)
Neutrophils Relative %: 64 %
Platelets: 209 10*3/uL (ref 150–400)
RBC: 4.27 MIL/uL (ref 3.87–5.11)
RDW: 13.6 % (ref 11.5–15.5)
WBC: 9.1 10*3/uL (ref 4.0–10.5)
nRBC: 0 % (ref 0.0–0.2)

## 2018-10-08 LAB — CBG MONITORING, ED: Glucose-Capillary: 245 mg/dL — ABNORMAL HIGH (ref 70–99)

## 2018-10-08 MED ORDER — PROMETHAZINE HCL 25 MG PO TABS
25.0000 mg | ORAL_TABLET | Freq: Once | ORAL | Status: AC
  Administered 2018-10-08: 25 mg via ORAL
  Filled 2018-10-08: qty 1

## 2018-10-08 MED ORDER — LACTATED RINGERS IV BOLUS
1000.0000 mL | Freq: Once | INTRAVENOUS | Status: AC
  Administered 2018-10-08: 1000 mL via INTRAVENOUS

## 2018-10-08 MED ORDER — MECLIZINE HCL 25 MG PO TABS
25.0000 mg | ORAL_TABLET | Freq: Once | ORAL | Status: AC
  Administered 2018-10-08: 25 mg via ORAL
  Filled 2018-10-08: qty 1

## 2018-10-08 MED ORDER — BENZONATATE 100 MG PO CAPS: 100.0000 mg | ORAL_CAPSULE | Freq: Three times a day (TID) | ORAL | 0 refills | Status: AC

## 2018-10-08 MED ORDER — PROMETHAZINE HCL 25 MG PO TABS: 25.0000 mg | ORAL_TABLET | Freq: Four times a day (QID) | ORAL | 0 refills | Status: DC | PRN

## 2018-10-08 MED ORDER — BENZONATATE 100 MG PO CAPS
100.0000 mg | ORAL_CAPSULE | Freq: Once | ORAL | Status: AC
  Administered 2018-10-08: 100 mg via ORAL
  Filled 2018-10-08: qty 1

## 2018-10-08 MED ORDER — METFORMIN HCL 1000 MG PO TABS: 500.0000 mg | ORAL_TABLET | Freq: Two times a day (BID) | ORAL | 0 refills | Status: DC

## 2018-10-08 NOTE — ED Triage Notes (Signed)
Pt presents with multiple complaints. States that she was drinking champagne earlier this afternoon and thinks she swallowed a piece of glass... her throat became sore immediately after drinking... Also reports gen weakness. States she thinks her sugar is high, has not checked CBG since December.

## 2018-10-08 NOTE — ED Provider Notes (Signed)
[facility] EMERGENCY DEPARTMENT Provider Note   CSN: [id number] Arrival date & time: 10/08/18  2012    History   Chief Complaint Chief Complaint  Patient presents with  . Multiple Complaints    HPI [name] [name] is a 48 y.o. female.     HPI Presents to the emergency department for multiple complaints.  States that she has had a sore throat on the left side of her throat ever since this afternoon when she believes she possibly swallowed a broken piece of glass.  Patient says she had a broken wine bottle and began drinking it anyways and she thought that there was not any glass in it but then began to feel throat pain afterwards.  Does not recall drinking any specific piece of glass or material.  Is also had an increased cough over the last several days.  Denies pain elsewhere.  She is concerned that she may choke and die on said piece of glass.  Does not take any medication currently as she is supposed to be on insulin and has not taken any since December.  Has a high glucose today of greater than 200.  Feeling anxious.  Denies any suicidal or homicidal ideation. Past Medical History:  Diagnosis Date  . Anxiety disorder   . Asthma   . Bipolar affective disorder ([place])   . Depression   . Diabetes mellitus   . Migraine   . Schizophrenia [facility])     Patient Active Problem List   Diagnosis Date Noted  . Bipolar I disorder, single manic episode, severe, with psychosis ([place]) 06/09/2018  . Bipolar disorder ([place]) 06/09/2018  . DKA, type 2 ([place]) 01/06/2018  . Diabetic acidosis without coma ([place])   . Adjustment disorder with depressed mood 01/04/2018  . Oral thrush   . Acute lower UTI   . DKA, type 2, not at goal [facility]) 01/03/2018  . Thrush 01/03/2018  . DM (diabetes mellitus), type 2, uncontrolled ([place]) 06/07/2017  . Abscess of groin, right 06/07/2017  . Abscess 06/07/2017  . Bipolar disorder with moderate depression ([place]) 08/31/2015  . Tobacco use disorder  08/28/2015  . HTN (hypertension) 08/28/2015  . Diabetes ([place]) 08/28/2015  . Hyponatremia 08/28/2015  . DM 10/26/2009  . OBESITY 10/26/2009  . Essential hypertension 10/26/2009  . DISTURBANCE OF SKIN SENSATION 10/26/2009  . Migraine headache 03/05/2007    Past Surgical History:  Procedure Laterality Date  . KNEE ARTHROSCOPY     x 2  . KNEE SURGERY Left    Incision made and knee cleaned out  . TUBAL LIGATION       OB History   No obstetric history on file.      Home Medications    Prior to Admission medications   Medication Sig Start Date End Date Taking? Authorizing Provider  albuterol (PROVENTIL HFA;VENTOLIN HFA) 108 (90 Base) MCG/ACT inhaler Inhale 2 puffs into the lungs every 6 (six) hours as needed for wheezing or shortness of breath. 01/06/18  Yes Eugenie Filler, MD  benztropine (COGENTIN) 0.5 MG tablet Take 1 tablet (0.5 mg total) by mouth 2 (two) times daily. 06/15/18 06/15/19 Yes Johnn Hai, MD  risperiDONE (RISPERDAL) 2 MG tablet 1 in am 2 at h s Patient taking differently: Take 2-4 mg by mouth See admin instructions. Take '2mg'$  in the morning and '4mg'$  at bedtime. 06/15/18  Yes Johnn Hai, MD  traZODone (DESYREL) 100 MG tablet Take 2 tablets (200 mg total) by mouth at bedtime. 06/15/18  Yes Johnn Hai, MD  blood glucose meter kit and supplies Dispense based on patient and insurance preference. Use up to four times daily as directed. (FOR ICD-10 E10.9, E11.9). 01/06/18   Eugenie Filler, MD  carbamazepine (TEGRETOL) 200 MG tablet Take 1 tablet (200 mg total) by mouth 2 (two) times daily. Patient not taking: Reported on 10/08/2018 06/15/18   Johnn Hai, MD  insulin aspart (NOVOLOG FLEXPEN) 100 UNIT/ML FlexPen Inject 3 Units into the skin 3 (three) times daily with meals. Patient not taking: Reported on 10/08/2018 01/06/18   Eugenie Filler, MD  Insulin Detemir (LEVEMIR FLEXTOUCH) 100 UNIT/ML Pen Inject 15 Units into the skin daily. Patient not taking: Reported on  06/08/2018 01/06/18   Eugenie Filler, MD  Insulin Pen Needle (PEN NEEDLES 3/16") 31G X 5 MM MISC 15 Units by Does not apply route daily. 01/06/18   Eugenie Filler, MD  lisinopril (PRINIVIL,ZESTRIL) 10 MG tablet Take 1 tablet (10 mg total) by mouth daily. Patient not taking: Reported on 04/05/2018 01/06/18   Eugenie Filler, MD    Family History Family History  Problem Relation Age of Onset  . Bipolar disorder Mother   . Hypertension Father   . Diabetes Father     Social History Social History   Tobacco Use  . Smoking status: Current Every Day Smoker    Packs/day: 1.00    Years: 29.00    Pack years: 29.00    Types: Cigarettes  . Smokeless tobacco: Never Used  Substance Use Topics  . Alcohol use: Not Currently    Comment: occasionally  . Drug use: No     Allergies   Tramadol; Other; and Nicotine   Review of Systems Review of Systems  Constitutional: Negative for activity change, appetite change, chills and fever.  HENT: Positive for congestion and sore throat. Negative for ear pain, trouble swallowing and voice change.   Eyes: Negative for pain and visual disturbance.  Respiratory: Positive for cough. Negative for shortness of breath and wheezing.   Cardiovascular: Negative for chest pain and palpitations.  Gastrointestinal: Negative for abdominal pain and vomiting.  Genitourinary: Negative for dysuria and hematuria.  Musculoskeletal: Negative for arthralgias and back pain.  Skin: Negative for color change and rash.  Allergic/Immunologic: Negative for immunocompromised state.  Neurological: Negative for seizures and syncope.  All other systems reviewed and are negative.    Physical Exam Updated Vital Signs BP 134/76 (BP Location: Left Arm)   Pulse 68   Temp 98.7 F (37.1 C) (Oral)   Resp 17   Ht '5\' 5"'$  (1.651 m)   Wt 74.8 kg   SpO2 99%   BMI 27.46 kg/m   Physical Exam Vitals signs and nursing note reviewed.  Constitutional:      General: She is  not in acute distress.    Appearance: She is well-developed. She is not ill-appearing or diaphoretic.  HENT:     Head: Normocephalic and atraumatic.     Nose: Congestion present. No rhinorrhea.     Comments: Minimal amount of erythema over the left tonsil.  No foreign bodies identified.    Mouth/Throat:     Pharynx: Posterior oropharyngeal erythema present. No oropharyngeal exudate.  Eyes:     Extraocular Movements: Extraocular movements intact.     Conjunctiva/sclera: Conjunctivae normal.     Pupils: Pupils are equal, round, and reactive to light.  Neck:     Musculoskeletal: Neck supple.  Cardiovascular:     Rate and Rhythm: Normal  rate and regular rhythm.     Heart sounds: No murmur.  Pulmonary:     Effort: Pulmonary effort is normal. No respiratory distress.     Breath sounds: Normal breath sounds.  Abdominal:     Palpations: Abdomen is soft.     Tenderness: There is no abdominal tenderness.  Musculoskeletal:        General: No deformity or signs of injury.     Right lower leg: No edema.     Left lower leg: No edema.  Skin:    General: Skin is warm and dry.     Findings: No lesion.  Neurological:     General: No focal deficit present.     Mental Status: She is alert and oriented to person, place, and time. Mental status is at baseline.     Cranial Nerves: No cranial nerve deficit.     Motor: No weakness.     Gait: Gait normal.     Comments: Cranial nerves II through XII bilaterally intact.  Gait without ataxia.  5 out of 5 strength in bilateral upper and lower extremities bilaterally.  No loss of light touch sensation in the body.  GCS 15.      ED Treatments / Results  Labs (all labs ordered are listed, but only abnormal results are displayed) Labs Reviewed  CBG MONITORING, ED - Abnormal; Notable for the following components:      Result Value   Glucose-Capillary 245 (*)    All other components within normal limits    EKG None  Radiology No results found.   Procedures Procedures (including critical care time)  Medications Ordered in ED Medications - No data to display   Initial Impression / Assessment and Plan / ED Course  I have reviewed the triage vital signs and the nursing notes.  Pertinent labs & imaging results that were available during my care of the patient were reviewed by me and considered in my medical decision making (see chart for details).         Well-appearing hemodynamically stable 48 year old woman presents to the emergency department for multiple complaints.  Blood glucose greater than 200.  Basic lab work does not show signs of DKA or acidosis.  No leukocytosis and hemoglobin is normal.  Somewhat tachycardic while in the ED but has been drinking wine today.  Possibly dehydrated.  Lactated Ringer bolus given with improvement of tachycardia.  Also has a cough and congestion with sore throat which is most likely URI.  No foreign bodies identified.  Glass would be very unlikely to show up on a plain film x-ray if it was small enough for her to swallow and not know if she had actually swallowed it so we will not perform an x-ray at this time as she otherwise appears to be respiratory intact.  As we are still within the possibility of COVID, patient was given COVID precautions.  CHARMAN BLASCO was evaluated in Emergency Department on 10/08/2018 for the symptoms described in the history of present illness. She was evaluated in the context of the global COVID-19 pandemic, which necessitated consideration that the patient might be at risk for infection with the SARS-CoV-2 virus that causes COVID-19. Institutional protocols and algorithms that pertain to the evaluation of patients at risk for COVID-19 are in a state of rapid change based on information released by regulatory bodies including the CDC and federal and state organizations. These policies and algorithms were followed during the patient's care in the ED.  Final Clinical  Impressions(s) / ED Diagnoses   Final diagnoses:  Swallowed foreign body, initial encounter  Viral upper respiratory tract infection  Hyperglycemia    ED Discharge Orders    None       Andee Poles, MD 10/08/18 8833    Gareth Morgan, MD 10/09/18 [phone]

## 2018-10-08 NOTE — ED Notes (Signed)
Nurse Navigator communication with updates for pt at the request of her daughter. Pt labs have been drawn and fluids being administered. No current ETA but the writer will have the patient call her daughter for d/c.

## 2018-10-08 NOTE — ED Notes (Signed)
Daughter, erica, would like an update on pts dispo at [phone]. She will be picking pt up if discharged

## 2018-10-20 ENCOUNTER — Telehealth: Payer: Self-pay

## 2018-10-20 NOTE — Telephone Encounter (Signed)
Attempted to reach patient on numbers listed and they were all out of service or wrong number. Called the emergency contact [name] [name] and left VM to have patient to call [facility] [phone] between the hours 0700-1900 Monday-Friday to speak to a nurse.  Nurse will need to discuss possible exposure to hospital employee tested positive COVID-19.

## 2018-12-13 ENCOUNTER — Inpatient Hospital Stay (HOSPITAL_COMMUNITY)
Admission: EM | Admit: 2018-12-13 | Discharge: 2018-12-18 | DRG: 918 | Disposition: A | Discharge status: Other Acute Inpatient Hospital | Payer: Medicaid Other | Attending: Internal Medicine | Admitting: Internal Medicine

## 2018-12-13 ENCOUNTER — Encounter (HOSPITAL_COMMUNITY): Payer: Self-pay | Admitting: *Deleted

## 2018-12-13 ENCOUNTER — Other Ambulatory Visit: Payer: Self-pay

## 2018-12-13 DIAGNOSIS — F209 Schizophrenia, unspecified: Secondary | ICD-10-CM | POA: Diagnosis present

## 2018-12-13 DIAGNOSIS — I10 Essential (primary) hypertension: Secondary | ICD-10-CM | POA: Diagnosis present

## 2018-12-13 DIAGNOSIS — I4581 Long QT syndrome: Secondary | ICD-10-CM | POA: Diagnosis present

## 2018-12-13 DIAGNOSIS — J45909 Unspecified asthma, uncomplicated: Secondary | ICD-10-CM | POA: Diagnosis present

## 2018-12-13 DIAGNOSIS — F17210 Nicotine dependence, cigarettes, uncomplicated: Secondary | ICD-10-CM | POA: Diagnosis present

## 2018-12-13 DIAGNOSIS — T1491XA Suicide attempt, initial encounter: Secondary | ICD-10-CM

## 2018-12-13 DIAGNOSIS — F3132 Bipolar disorder, current episode depressed, moderate: Secondary | ICD-10-CM | POA: Diagnosis present

## 2018-12-13 DIAGNOSIS — T50902A Poisoning by unspecified drugs, medicaments and biological substances, intentional self-harm, initial encounter: Secondary | ICD-10-CM | POA: Diagnosis not present

## 2018-12-13 DIAGNOSIS — E1169 Type 2 diabetes mellitus with other specified complication: Secondary | ICD-10-CM

## 2018-12-13 DIAGNOSIS — R338 Other retention of urine: Secondary | ICD-10-CM | POA: Diagnosis present

## 2018-12-13 DIAGNOSIS — Z818 Family history of other mental and behavioral disorders: Secondary | ICD-10-CM

## 2018-12-13 DIAGNOSIS — T450X2A Poisoning by antiallergic and antiemetic drugs, intentional self-harm, initial encounter: Principal | ICD-10-CM | POA: Diagnosis present

## 2018-12-13 DIAGNOSIS — T464X2A Poisoning by angiotensin-converting-enzyme inhibitors, intentional self-harm, initial encounter: Secondary | ICD-10-CM | POA: Diagnosis present

## 2018-12-13 DIAGNOSIS — E1165 Type 2 diabetes mellitus with hyperglycemia: Secondary | ICD-10-CM

## 2018-12-13 DIAGNOSIS — Z79899 Other long term (current) drug therapy: Secondary | ICD-10-CM

## 2018-12-13 DIAGNOSIS — E119 Type 2 diabetes mellitus without complications: Secondary | ICD-10-CM

## 2018-12-13 DIAGNOSIS — Z1159 Encounter for screening for other viral diseases: Secondary | ICD-10-CM

## 2018-12-13 DIAGNOSIS — Z794 Long term (current) use of insulin: Secondary | ICD-10-CM

## 2018-12-13 DIAGNOSIS — F17200 Nicotine dependence, unspecified, uncomplicated: Secondary | ICD-10-CM | POA: Diagnosis present

## 2018-12-13 DIAGNOSIS — Z833 Family history of diabetes mellitus: Secondary | ICD-10-CM

## 2018-12-13 DIAGNOSIS — Z8249 Family history of ischemic heart disease and other diseases of the circulatory system: Secondary | ICD-10-CM

## 2018-12-13 DIAGNOSIS — Z915 Personal history of self-harm: Secondary | ICD-10-CM

## 2018-12-13 DIAGNOSIS — F314 Bipolar disorder, current episode depressed, severe, without psychotic features: Secondary | ICD-10-CM | POA: Diagnosis present

## 2018-12-13 LAB — COMPREHENSIVE METABOLIC PANEL
ALT: 12 U/L (ref 0–44)
AST: 11 U/L — ABNORMAL LOW (ref 15–41)
Albumin: 4.2 g/dL (ref 3.5–5.0)
Alkaline Phosphatase: 70 U/L (ref 38–126)
Anion gap: 13 (ref 5–15)
BUN: 15 mg/dL (ref 6–20)
CO2: 20 mmol/L — ABNORMAL LOW (ref 22–32)
Calcium: 8.7 mg/dL — ABNORMAL LOW (ref 8.9–10.3)
Chloride: 106 mmol/L (ref 98–111)
Creatinine, Ser: 1.11 mg/dL — ABNORMAL HIGH (ref 0.44–1.00)
GFR calc Af Amer: >60 mL/min (ref >60)
GFR calc non Af Amer: 58 mL/min — ABNORMAL LOW (ref >60)
Glucose, Bld: 203 mg/dL — ABNORMAL HIGH (ref 70–99)
Potassium: 3.8 mmol/L (ref 3.5–5.1)
Sodium: 139 mmol/L (ref 135–145)
Total Bilirubin: 0.4 mg/dL (ref 0.3–1.2)
Total Protein: 6.8 g/dL (ref 6.5–8.1)

## 2018-12-13 LAB — MAGNESIUM: Magnesium: 1.7 mg/dL (ref 1.7–2.4)

## 2018-12-13 LAB — CBC
HCT: 42.5 % (ref 36.0–46.0)
Hemoglobin: 13.6 g/dL (ref 12.0–15.0)
MCH: 29.2 pg (ref 26.0–34.0)
MCHC: 32 g/dL (ref 30.0–36.0)
MCV: 91.2 fL (ref 80.0–100.0)
Platelets: 181 10*3/uL (ref 150–400)
RBC: 4.66 MIL/uL (ref 3.87–5.11)
RDW: 13.2 % (ref 11.5–15.5)
WBC: 9.7 10*3/uL (ref 4.0–10.5)
nRBC: 0 % (ref 0.0–0.2)

## 2018-12-13 LAB — AMMONIA: Ammonia: <9 umol/L — ABNORMAL LOW (ref 9–35)

## 2018-12-13 LAB — RAPID URINE DRUG SCREEN, HOSP PERFORMED
Amphetamines: NOT DETECTED
Barbiturates: NOT DETECTED
Benzodiazepines: POSITIVE — AB
Cocaine: NOT DETECTED
Opiates: NOT DETECTED
Tetrahydrocannabinol: NOT DETECTED

## 2018-12-13 LAB — CBG MONITORING, ED: Glucose-Capillary: 182 mg/dL — ABNORMAL HIGH (ref 70–99)

## 2018-12-13 LAB — GLUCOSE, CAPILLARY: Glucose-Capillary: 146 mg/dL — ABNORMAL HIGH (ref 70–99)

## 2018-12-13 LAB — CARBAMAZEPINE LEVEL, TOTAL: Carbamazepine Lvl: <2 ug/mL — ABNORMAL LOW (ref 4.0–12.0)

## 2018-12-13 LAB — ETHANOL: Alcohol, Ethyl (B): <10 mg/dL (ref <10)

## 2018-12-13 LAB — SALICYLATE LEVEL: Salicylate Lvl: <7 mg/dL (ref 2.8–30.0)

## 2018-12-13 LAB — SARS CORONAVIRUS 2 BY RT PCR (HOSPITAL ORDER, PERFORMED IN ~~LOC~~ HOSPITAL LAB): SARS Coronavirus 2: NEGATIVE

## 2018-12-13 LAB — ACETAMINOPHEN LEVEL: Acetaminophen (Tylenol), Serum: <10 ug/mL — ABNORMAL LOW (ref 10–30)

## 2018-12-13 MED ORDER — ACETAMINOPHEN 650 MG RE SUPP: 650.0000 mg | Freq: Four times a day (QID) | RECTAL | Status: DC | PRN

## 2018-12-13 MED ORDER — INSULIN ASPART 100 UNIT/ML ~~LOC~~ SOLN
0.0000 [IU] | Freq: Three times a day (TID) | SUBCUTANEOUS | Status: DC
  Administered 2018-12-17 – 2018-12-18 (×3): 1 [IU] via SUBCUTANEOUS

## 2018-12-13 MED ORDER — ENOXAPARIN SODIUM 40 MG/0.4ML ~~LOC~~ SOLN
40.0000 mg | Freq: Every day | SUBCUTANEOUS | Status: DC
  Administered 2018-12-14 – 2018-12-17 (×5): 40 mg via SUBCUTANEOUS
  Filled 2018-12-13 (×5): qty 0.4

## 2018-12-13 MED ORDER — SENNOSIDES-DOCUSATE SODIUM 8.6-50 MG PO TABS: 1.0000 | ORAL_TABLET | Freq: Every evening | ORAL | Status: DC | PRN

## 2018-12-13 MED ORDER — ACETAMINOPHEN 325 MG PO TABS
650.0000 mg | ORAL_TABLET | Freq: Four times a day (QID) | ORAL | Status: DC | PRN
  Administered 2018-12-13 – 2018-12-18 (×4): 650 mg via ORAL
  Filled 2018-12-13 (×5): qty 2

## 2018-12-13 MED ORDER — ONDANSETRON HCL 4 MG PO TABS
4.0000 mg | ORAL_TABLET | Freq: Four times a day (QID) | ORAL | Status: DC | PRN
  Administered 2018-12-13 – 2018-12-18 (×4): 4 mg via ORAL
  Filled 2018-12-13 (×4): qty 1

## 2018-12-13 MED ORDER — SODIUM CHLORIDE 0.9 % IV BOLUS
1000.0000 mL | Freq: Once | INTRAVENOUS | Status: AC
  Administered 2018-12-13: 1000 mL via INTRAVENOUS

## 2018-12-13 MED ORDER — ONDANSETRON HCL 4 MG/2ML IJ SOLN
4.0000 mg | Freq: Four times a day (QID) | INTRAMUSCULAR | Status: DC | PRN
  Administered 2018-12-14 – 2018-12-15 (×2): 4 mg via INTRAVENOUS
  Filled 2018-12-13 (×2): qty 2

## 2018-12-13 MED ORDER — INSULIN ASPART 100 UNIT/ML ~~LOC~~ SOLN: 0.0000 [IU] | Freq: Every day | SUBCUTANEOUS | Status: DC

## 2018-12-13 MED ORDER — ORAL CARE MOUTH RINSE
15.0000 mL | Freq: Two times a day (BID) | OROMUCOSAL | Status: DC
  Administered 2018-12-14 – 2018-12-17 (×5): 15 mL via OROMUCOSAL

## 2018-12-13 NOTE — ED Notes (Addendum)
Ebony Hail from [employer] called to follow up on pt status and Repeat EKG done to review QTc and QRS rate. Poison Control will call back around 1am for follow up.

## 2018-12-13 NOTE — ED Triage Notes (Signed)
EMS reports pt called EMS after taking 50 10mg  Lisinopril and 50 25mg  Benadryl with intent to harm self. Superficial lacs to left wrist. HR 150-160- 148/90 CBG 222. Pt is oriented at times then starts repeative statements.

## 2018-12-13 NOTE — ED Notes (Signed)
I have just spoken with Alyse Low, RN at [facility] who advises the following: EKG; labs: CMET, Mg+, Tylenol level. Recommend benzos for agitation/tachycardia. Watch for: urinary retention, slowing of the gut. Otherwise, supportive care.

## 2018-12-13 NOTE — ED Provider Notes (Signed)
[facility] DEPT Provider Note   CSN: [id number] Arrival date & time: 12/13/18  1429    History   Chief Complaint Chief Complaint  Patient presents with  . Drug Overdose    HPI [name] [name] is a 48 y.o. female.     HPI Level 5 caveat due to altered mental status. Patient reportedly overdosed on 50 to 60 10 mg lisinopril tablets.  Also reportedly took 50 to 60 25 mg tablets of Benadryl.  Reportedly took around 1 hour prior to arrival and then called EMS.  However patient really cannot provide much history at this time.  Reportedly was done in a suicide attempt. Past Medical History:  Diagnosis Date  . Anxiety disorder   . Asthma   . Bipolar affective disorder ([place])   . Depression   . Diabetes mellitus   . Migraine   . Schizophrenia [facility])     Patient Active Problem List   Diagnosis Date Noted  . Bipolar I disorder, single manic episode, severe, with psychosis ([place]) 06/09/2018  . Bipolar disorder ([place]) 06/09/2018  . DKA, type 2 ([place]) 01/06/2018  . Diabetic acidosis without coma ([place])   . Adjustment disorder with depressed mood 01/04/2018  . Oral thrush   . Acute lower UTI   . DKA, type 2, not at goal [facility]) 01/03/2018  . Thrush 01/03/2018  . DM (diabetes mellitus), type 2, uncontrolled ([place]) 06/07/2017  . Abscess of groin, right 06/07/2017  . Abscess 06/07/2017  . Bipolar disorder with moderate depression ([place]) 08/31/2015  . Tobacco use disorder 08/28/2015  . HTN (hypertension) 08/28/2015  . Diabetes ([place]) 08/28/2015  . Hyponatremia 08/28/2015  . DM 10/26/2009  . OBESITY 10/26/2009  . Essential hypertension 10/26/2009  . DISTURBANCE OF SKIN SENSATION 10/26/2009  . Migraine headache 03/05/2007    Past Surgical History:  Procedure Laterality Date  . KNEE ARTHROSCOPY     x 2  . KNEE SURGERY Left    Incision made and knee cleaned out  . TUBAL LIGATION       OB History   No obstetric history on file.      Home  Medications    Prior to Admission medications   Medication Sig Start Date End Date Taking? Authorizing Provider  ibuprofen (ADVIL) 200 MG tablet Take 400 mg by mouth every 6 (six) hours as needed for headache.   Yes [provider]  metFORMIN (GLUCOPHAGE) 1000 MG tablet Take 0.5 tablets (500 mg total) by mouth 2 (two) times daily. 10/08/18  Yes Andee Poles, MD  promethazine (PHENERGAN) 25 MG tablet Take 1 tablet (25 mg total) by mouth every 6 (six) hours as needed for nausea or vomiting. 10/08/18  Yes Andee Poles, MD  albuterol (PROVENTIL HFA;VENTOLIN HFA) 108 (90 Base) MCG/ACT inhaler Inhale 2 puffs into the lungs every 6 (six) hours as needed for wheezing or shortness of breath. Patient not taking: Reported on 12/13/2018 01/06/18   Eugenie Filler, MD  benztropine (COGENTIN) 0.5 MG tablet Take 1 tablet (0.5 mg total) by mouth 2 (two) times daily. Patient not taking: Reported on 12/13/2018 06/15/18 06/15/19  Johnn Hai, MD  blood glucose meter kit and supplies Dispense based on patient and insurance preference. Use up to four times daily as directed. (FOR ICD-10 E10.9, E11.9). 01/06/18   Eugenie Filler, MD  carbamazepine (TEGRETOL) 200 MG tablet Take 1 tablet (200 mg total) by mouth 2 (two) times daily. Patient not taking: Reported on 10/08/2018 06/15/18   Jake Samples,  Aaron Edelman, MD  insulin aspart (NOVOLOG FLEXPEN) 100 UNIT/ML FlexPen Inject 3 Units into the skin 3 (three) times daily with meals. Patient not taking: Reported on 10/08/2018 01/06/18   Eugenie Filler, MD  Insulin Detemir (LEVEMIR FLEXTOUCH) 100 UNIT/ML Pen Inject 15 Units into the skin daily. Patient not taking: Reported on 06/08/2018 01/06/18   Eugenie Filler, MD  Insulin Pen Needle (PEN NEEDLES 3/16") 31G X 5 MM MISC 15 Units by Does not apply route daily. 01/06/18   Eugenie Filler, MD  lisinopril (PRINIVIL,ZESTRIL) 10 MG tablet Take 1 tablet (10 mg total) by mouth daily. Patient not taking: Reported on  04/05/2018 01/06/18   Eugenie Filler, MD  risperiDONE (RISPERDAL) 2 MG tablet 1 in am 2 at h s Patient not taking: Reported on 12/13/2018 06/15/18   Johnn Hai, MD  traZODone (DESYREL) 100 MG tablet Take 2 tablets (200 mg total) by mouth at bedtime. Patient not taking: Reported on 12/13/2018 06/15/18   Johnn Hai, MD    Family History Family History  Problem Relation Age of Onset  . Bipolar disorder Mother   . Hypertension Father   . Diabetes Father     Social History Social History   Tobacco Use  . Smoking status: Current Every Day Smoker    Packs/day: 1.00    Years: 29.00    Pack years: 29.00    Types: Cigarettes  . Smokeless tobacco: Never Used  Substance Use Topics  . Alcohol use: Not Currently    Comment: occasionally  . Drug use: No     Allergies   Tramadol, Other, and Nicotine   Review of Systems Review of Systems  Unable to perform ROS: Mental status change     Physical Exam Updated Vital Signs BP (!) 132/91 (BP Location: Right Arm)   Pulse (!) 118   Temp 98.5 F (36.9 C) (Oral)   Resp (!) 30   SpO2 93%   Physical Exam Vitals signs and nursing note reviewed.  Constitutional:      Comments: Awake with some confusion.  HENT:     Head: Normocephalic.  Eyes:     Comments: Mildly dilated but reactive.  Cardiovascular:     Rate and Rhythm: Tachycardia present.  Abdominal:     Tenderness: There is no abdominal tenderness.  Musculoskeletal:     Right lower leg: No edema.     Left lower leg: No edema.  Skin:    General: Skin is warm.     Capillary Refill: Capillary refill takes less than 2 seconds.  Neurological:     Comments: Sitting in bed with eyes closed but awakes to voice.  Able answer some questions but overall confused.      ED Treatments / Results  Labs (all labs ordered are listed, but only abnormal results are displayed) Labs Reviewed  COMPREHENSIVE METABOLIC PANEL - Abnormal; Notable for the following components:      Result  Value   CO2 20 (*)    Glucose, Bld 203 (*)    Creatinine, Ser 1.11 (*)    Calcium 8.7 (*)    AST 11 (*)    GFR calc non Af Amer 58 (*)    All other components within normal limits  ACETAMINOPHEN LEVEL - Abnormal; Notable for the following components:   Acetaminophen (Tylenol), Serum <10 (*)    All other components within normal limits  RAPID URINE DRUG SCREEN, HOSP PERFORMED - Abnormal; Notable for the following components:   Benzodiazepines POSITIVE (*)  All other components within normal limits  CARBAMAZEPINE LEVEL, TOTAL - Abnormal; Notable for the following components:   Carbamazepine Lvl <2.0 (*)    All other components within normal limits  AMMONIA - Abnormal; Notable for the following components:   Ammonia <9 (*)    All other components within normal limits  CBG MONITORING, ED - Abnormal; Notable for the following components:   Glucose-Capillary 182 (*)    All other components within normal limits  SARS CORONAVIRUS 2 (HOSPITAL ORDER, [facility])  ETHANOL  SALICYLATE LEVEL  CBC  MAGNESIUM  I-STAT BETA HCG BLOOD, ED ([place], [facility], [facility] ONLY)    EKG EKG Interpretation  Date/Time:  Sunday December 13 2018 14:40:07 EDT Ventricular Rate:  143 PR Interval:    QRS Duration: 98 QT Interval:  331 QTC Calculation: 511 R Axis:   -39 Text Interpretation:  Sinus tachycardia Left axis deviation Prolonged QT interval Confirmed by Davonna Belling [phone]) on 12/13/2018 2:45:57 PM   Radiology No results found.  Procedures Procedures (including critical care time)  Medications Ordered in ED Medications  sodium chloride 0.9 % bolus 1,000 mL (0 mLs Intravenous Stopped 12/13/18 1743)     Initial Impression / Assessment and Plan / ED Course  I have reviewed the triage vital signs and the nursing notes.  Pertinent labs & imaging results that were available during my care of the patient were reviewed by me and considered in my medical decision making (see chart  for details).        Patient presents after intentional overdose on lisinopril and Benadryl.  Has been monitored in the ER for 6 hours and continued tachycardia with heart rate still around 120.  However has maintained her blood pressure.  With continued tachycardia I feel patient would benefit from observation overnight in the hospital.  She however did not a suicide attempt and will need psychiatric evaluation once she is medically cleared.  Final Clinical Impressions(s) / ED Diagnoses   Final diagnoses:  Intentional diphenhydramine overdose, initial encounter [facility])  Suicide attempt [facility])    ED Discharge Orders    None       Davonna Belling, MD 12/13/18 2103

## 2018-12-13 NOTE — ED Notes (Signed)
Bed: RESB Expected date:  Expected time:  Means of arrival:  Comments: 48 yo OD-Benadryl, lisinopril

## 2018-12-13 NOTE — ED Notes (Signed)
She remains in no distress. She was coherent enough to ask to go to b.r., to which she was escorted by our C.N., Manuela Schwartz.

## 2018-12-13 NOTE — ED Notes (Signed)
She remains drowsy and in no distress.

## 2018-12-13 NOTE — H&P (Addendum)
History and Physical    [name] [name] ZHG:[id number] DOB: [birth year] DOA: 12/13/2018  PCP: System, Pcp Not In  Patient coming from: Home  I have personally briefly reviewed patient's old medical records in [place]  Chief Complaint: Intentional overdose, suicidal ideation  HPI: [name] [name] is a 48 y.o. female with medical history significant of bipolar disorder, essential hypertension, type 2 diabetes mellitus who presents with intentional overdose and suicidal ideation.  Patient reports that she took #50 10 mg lisinopril tablets and #50 25 mg Benadryl tablets an hour before calling EMS.  She reports that "I have not been right on my medications" and "I want to die, and do not feel like living".  She reports that she is going to be evicted from her trailer and that her son just got married and now has very little time to help her.  She does report that she has tried to cut her wrists over the past few days as well.  Patient does not report any other concerns/complaints at this time other than her progressive depression and continued suicidal thoughts.  ED Course: Temperature 98.5, HR 142, RR 21, BP 133/91, SPO2 96% on room air.  WBC count 9.7, hemoglobin 13.1, platelets 181, magnesium 1.7.  Sodium 134, potassium 3.8, chloride 106, BUN 20, BUN 15, creatinine 1.10, glucose 203.  EKG with sinus tachycardia, QTC 511, QRS 98, no concerning ST elevation/depression or T wave inversions.  Patient was given 1 L normal saline bolus.  Poison control was consulted with recommendations of observation and continued telemetry/EKG monitoring of intervals.  Review of Systems: As per HPI otherwise 10 point review of systems negative.   Past Medical History:  Diagnosis Date  . Anxiety disorder   . Asthma   . Bipolar affective disorder ([place])   . Depression   . Diabetes mellitus   . Migraine   . Schizophrenia [facility])     Past Surgical History:  Procedure Laterality Date  . KNEE ARTHROSCOPY     x  2  . KNEE SURGERY Left    Incision made and knee cleaned out  . TUBAL LIGATION       reports that she has been smoking cigarettes. She has a 29.00 pack-year smoking history. She has never used smokeless tobacco. She reports previous alcohol use. She reports that she does not use drugs.  Allergies  Allergen Reactions  . Tramadol Nausea Only    "feeling like I'm on fire"  . Other Other (See Comments)    Pt reports being allergic to nicotine patches only.  She can use the gum  . Nicotine Rash    Patient states she is allergic to the Nicotine patch and they cause her to break out in a rash.  She states she can smoke cigarettes.    Family History  Problem Relation Age of Onset  . Bipolar disorder Mother   . Hypertension Father   . Diabetes Father      Prior to Admission medications   Medication Sig Start Date End Date Taking? Authorizing Provider  ibuprofen (ADVIL) 200 MG tablet Take 400 mg by mouth every 6 (six) hours as needed for headache.   Yes [provider]  metFORMIN (GLUCOPHAGE) 1000 MG tablet Take 0.5 tablets (500 mg total) by mouth 2 (two) times daily. 10/08/18  Yes Andee Poles, MD  promethazine (PHENERGAN) 25 MG tablet Take 1 tablet (25 mg total) by mouth every 6 (six) hours as needed for nausea or vomiting. 10/08/18  Yes Andee Poles, MD  albuterol (PROVENTIL HFA;VENTOLIN HFA) 108 (90 Base) MCG/ACT inhaler Inhale 2 puffs into the lungs every 6 (six) hours as needed for wheezing or shortness of breath. Patient not taking: Reported on 12/13/2018 01/06/18   Eugenie Filler, MD  benztropine (COGENTIN) 0.5 MG tablet Take 1 tablet (0.5 mg total) by mouth 2 (two) times daily. Patient not taking: Reported on 12/13/2018 06/15/18 06/15/19  Johnn Hai, MD  blood glucose meter kit and supplies Dispense based on patient and insurance preference. Use up to four times daily as directed. (FOR ICD-10 E10.9, E11.9). 01/06/18   Eugenie Filler, MD  carbamazepine  (TEGRETOL) 200 MG tablet Take 1 tablet (200 mg total) by mouth 2 (two) times daily. Patient not taking: Reported on 10/08/2018 06/15/18   Johnn Hai, MD  insulin aspart (NOVOLOG FLEXPEN) 100 UNIT/ML FlexPen Inject 3 Units into the skin 3 (three) times daily with meals. Patient not taking: Reported on 10/08/2018 01/06/18   Eugenie Filler, MD  Insulin Detemir (LEVEMIR FLEXTOUCH) 100 UNIT/ML Pen Inject 15 Units into the skin daily. Patient not taking: Reported on 06/08/2018 01/06/18   Eugenie Filler, MD  Insulin Pen Needle (PEN NEEDLES 3/16") 31G X 5 MM MISC 15 Units by Does not apply route daily. 01/06/18   Eugenie Filler, MD  lisinopril (PRINIVIL,ZESTRIL) 10 MG tablet Take 1 tablet (10 mg total) by mouth daily. Patient not taking: Reported on 04/05/2018 01/06/18   Eugenie Filler, MD  risperiDONE (RISPERDAL) 2 MG tablet 1 in am 2 at h s Patient not taking: Reported on 12/13/2018 06/15/18   Johnn Hai, MD  traZODone (DESYREL) 100 MG tablet Take 2 tablets (200 mg total) by mouth at bedtime. Patient not taking: Reported on 12/13/2018 06/15/18   Johnn Hai, MD    Physical Exam: Vitals:   12/13/18 1700 12/13/18 1800 12/13/18 1934 12/13/18 2101  BP: (!) 155/102 (!) 151/91 (!) 150/115 (!) 132/91  Pulse: (!) 131 (!) 120 (!) 124 (!) 118  Resp:  (!) 26 (!) 24 (!) 30  Temp:      TempSrc:      SpO2: 98% 95% 93% 93%    Constitutional: NAD, calm, comfortable Vitals:   12/13/18 1700 12/13/18 1800 12/13/18 1934 12/13/18 2101  BP: (!) 155/102 (!) 151/91 (!) 150/115 (!) 132/91  Pulse: (!) 131 (!) 120 (!) 124 (!) 118  Resp:  (!) 26 (!) 24 (!) 30  Temp:      TempSrc:      SpO2: 98% 95% 93% 93%   Eyes: PERRL, lids and conjunctivae normal ENMT: Mucous membranes are moist. Posterior pharynx clear of any exudate or lesions.Normal dentition.  Neck: normal, supple, no masses, no thyromegaly Respiratory: clear to auscultation bilaterally, no wheezing, no crackles. Normal respiratory effort. No  accessory muscle use.  Cardiovascular: Tachycardic, regular rhythm, no murmurs / rubs / gallops. No extremity edema. 2+ pedal pulses. No carotid bruits.  Abdomen: no tenderness, no masses palpated. No hepatosplenomegaly. Bowel sounds positive.  Musculoskeletal: no clubbing / cyanosis. No joint deformity upper and lower extremities. Good ROM, no contractures. Normal muscle tone.  Skin: no rashes, lesions, ulcers. No induration Neurologic: CN 2-12 grossly intact. Sensation intact, DTR normal. Strength 5/5 in all 4.  Psychiatric: Poor insight/judgment, alert and oriented x4, depressed mood, slight anxiousness   Labs on Admission: I have personally reviewed following labs and imaging studies  CBC: Recent Labs  Lab 12/13/18 1450  WBC 9.7  HGB 13.6  HCT 42.5  MCV 91.2  PLT 161   Basic Metabolic Panel: Recent Labs  Lab 12/13/18 1450 12/13/18 1506  NA 139  --   K 3.8  --   CL 106  --   CO2 20*  --   GLUCOSE 203*  --   BUN 15  --   CREATININE 1.11*  --   CALCIUM 8.7*  --   MG  --  1.7   GFR: CrCl cannot be calculated (Unknown ideal weight.). Liver Function Tests: Recent Labs  Lab 12/13/18 1450  AST 11*  ALT 12  ALKPHOS 70  BILITOT 0.4  PROT 6.8  ALBUMIN 4.2   No results for input(s): LIPASE, AMYLASE in the last 168 hours. Recent Labs  Lab 12/13/18 1506  AMMONIA <9*   Coagulation Profile: No results for input(s): INR, PROTIME in the last 168 hours. Cardiac Enzymes: No results for input(s): CKTOTAL, CKMB, CKMBINDEX, TROPONINI in the last 168 hours. BNP (last 3 results) No results for input(s): PROBNP in the last 8760 hours. HbA1C: No results for input(s): HGBA1C in the last 72 hours. CBG: Recent Labs  Lab 12/13/18 2020  GLUCAP 182*   Lipid Profile: No results for input(s): CHOL, HDL, LDLCALC, TRIG, CHOLHDL, LDLDIRECT in the last 72 hours. Thyroid Function Tests: No results for input(s): TSH, T4TOTAL, FREET4, T3FREE, THYROIDAB in the last 72 hours. Anemia  Panel: No results for input(s): VITAMINB12, FOLATE, FERRITIN, TIBC, IRON, RETICCTPCT in the last 72 hours. Urine analysis:    Component Value Date/Time   COLORURINE RED (A) 01/03/2018 1720   APPEARANCEUR HAZY (A) 01/03/2018 1720   LABSPEC 1.028 01/03/2018 1720   PHURINE 6.0 01/03/2018 1720   GLUCOSEU >=500 (A) 01/03/2018 1720   HGBUR LARGE (A) 01/03/2018 1720   HGBUR trace-intact 11/25/2008 1530   BILIRUBINUR NEGATIVE 01/03/2018 1720   KETONESUR 20 (A) 01/03/2018 1720   PROTEINUR 30 (A) 01/03/2018 1720   UROBILINOGEN 0.2 05/12/2014 2315   NITRITE NEGATIVE 01/03/2018 1720   LEUKOCYTESUR MODERATE (A) 01/03/2018 1720    Radiological Exams on Admission: No results found.  EKG: Independently reviewed.  Sinus tachycardia, rate 126, QTc 511, QRS 98, no concerning ST elevation/depression, no concerning T wave inversions.  Assessment/Plan Active Problems:   Overdose, intentional self-harm, initial encounter ([place])  Overdose, intentional self-harm Suicidal ideation Prolonged QTc Patient presenting after ingesting multiple lisinopril and Benadryl tablets in an apparent suicide attempt.  Patient reports that she continues to have suicidal ideations and "wants to die and does not feel like living".  UDS positive for benzos.  EtOH less than 10.  Salicylate level less than 7.0.  Tylenol level less than 10.  Tegretol level less than 2.0. --Poison control contacted, continues to follow --QRS 98-->97 --QTC511-->427 --Continue supportive measures, IV fluid hydration --Avoid QTC prolonging agents --Continue to monitor on telemetry with EKGs --Suicide precautions, one-to-one sitter --Psychiatry evaluation for transfer to inpatient psychiatry, likely will be medically ready on 12/14/2018  Sinus tachycardia Patient with elevated heart rate on admission 242.  EKG notable for sinus tachycardia with etiology likely from overdose with lisinopril/Benadryl. --Continue IV fluid hydration --Pending  monitor on telemetry with serial EKGs  Bipolar disorder Patient reports not on any medications at this time.  She reports that she has had issues with medication compliance and adherence to regimen.  She also reports that multiple medications have been ineffective. --Psychiatry evaluation for further medication recommendations  Essential hypertension Patient reports on lisinopril 10 mg p.o. daily at home.  Reports that she ingested roughly 41  pills this afternoon in an apparent suicide attempt. --Blood pressure currently stable, 133/91 --Hold home lisinopril for now as above --To new to monitor blood pressure closely  Tobacco abuse disorder Patient continues to smoke roughly 1 pack/day.  Counseled on need for cessation.  She declines nicotine patch as this has caused her side effects in the past.  Diabetes mellitus Patient reports on metformin 1000 g p.o. twice daily at home.  Leukos elevated to 203 on admission. --Check hemoglobin A1c --Hold oral hypoglycemics while inpatient --Consistent carbohydrate/cardiac diet --[employer] insulin sliding scale for coverage while inpatient   DVT prophylaxis: Lovenox Code Status: Full code Family Communication: None Disposition Plan: Likely will need inpatient psychiatry when medically cleared Consults called: Psychiatry Admission status: Observation    J [place] DO [facility] Pager [phone]  If 7PM-7AM, please contact night-coverage [email or web address] Password [email or web address]  12/13/2018, 10:00 PM

## 2018-12-14 ENCOUNTER — Other Ambulatory Visit: Payer: Self-pay

## 2018-12-14 DIAGNOSIS — T450X2A Poisoning by antiallergic and antiemetic drugs, intentional self-harm, initial encounter: Secondary | ICD-10-CM | POA: Diagnosis present

## 2018-12-14 DIAGNOSIS — F17210 Nicotine dependence, cigarettes, uncomplicated: Secondary | ICD-10-CM | POA: Diagnosis present

## 2018-12-14 DIAGNOSIS — T50902A Poisoning by unspecified drugs, medicaments and biological substances, intentional self-harm, initial encounter: Secondary | ICD-10-CM | POA: Diagnosis not present

## 2018-12-14 DIAGNOSIS — T887XXA Unspecified adverse effect of drug or medicament, initial encounter: Secondary | ICD-10-CM | POA: Diagnosis not present

## 2018-12-14 DIAGNOSIS — T470X2A Poisoning by histamine H2-receptor blockers, intentional self-harm, initial encounter: Secondary | ICD-10-CM

## 2018-12-14 DIAGNOSIS — I10 Essential (primary) hypertension: Secondary | ICD-10-CM | POA: Diagnosis present

## 2018-12-14 DIAGNOSIS — Z915 Personal history of self-harm: Secondary | ICD-10-CM | POA: Diagnosis not present

## 2018-12-14 DIAGNOSIS — T464X2A Poisoning by angiotensin-converting-enzyme inhibitors, intentional self-harm, initial encounter: Secondary | ICD-10-CM

## 2018-12-14 DIAGNOSIS — I4581 Long QT syndrome: Secondary | ICD-10-CM | POA: Diagnosis present

## 2018-12-14 DIAGNOSIS — E119 Type 2 diabetes mellitus without complications: Secondary | ICD-10-CM | POA: Diagnosis present

## 2018-12-14 DIAGNOSIS — T1491XA Suicide attempt, initial encounter: Secondary | ICD-10-CM | POA: Diagnosis present

## 2018-12-14 DIAGNOSIS — J45909 Unspecified asthma, uncomplicated: Secondary | ICD-10-CM | POA: Diagnosis present

## 2018-12-14 DIAGNOSIS — F3132 Bipolar disorder, current episode depressed, moderate: Secondary | ICD-10-CM | POA: Diagnosis present

## 2018-12-14 DIAGNOSIS — Z79899 Other long term (current) drug therapy: Secondary | ICD-10-CM | POA: Diagnosis not present

## 2018-12-14 DIAGNOSIS — Z833 Family history of diabetes mellitus: Secondary | ICD-10-CM | POA: Diagnosis not present

## 2018-12-14 DIAGNOSIS — F209 Schizophrenia, unspecified: Secondary | ICD-10-CM | POA: Diagnosis present

## 2018-12-14 DIAGNOSIS — Z794 Long term (current) use of insulin: Secondary | ICD-10-CM | POA: Diagnosis not present

## 2018-12-14 DIAGNOSIS — F319 Bipolar disorder, unspecified: Secondary | ICD-10-CM | POA: Diagnosis not present

## 2018-12-14 DIAGNOSIS — R338 Other retention of urine: Secondary | ICD-10-CM | POA: Diagnosis present

## 2018-12-14 DIAGNOSIS — Z1159 Encounter for screening for other viral diseases: Secondary | ICD-10-CM | POA: Diagnosis not present

## 2018-12-14 DIAGNOSIS — Z8249 Family history of ischemic heart disease and other diseases of the circulatory system: Secondary | ICD-10-CM | POA: Diagnosis not present

## 2018-12-14 DIAGNOSIS — Z818 Family history of other mental and behavioral disorders: Secondary | ICD-10-CM | POA: Diagnosis not present

## 2018-12-14 LAB — I-STAT BETA HCG BLOOD, ED (NOT ORDERABLE): I-stat hCG, quantitative: <5 m[IU]/mL (ref <5)

## 2018-12-14 LAB — COMPREHENSIVE METABOLIC PANEL
ALT: 13 U/L (ref 0–44)
AST: 11 U/L — ABNORMAL LOW (ref 15–41)
Albumin: 3.8 g/dL (ref 3.5–5.0)
Alkaline Phosphatase: 59 U/L (ref 38–126)
Anion gap: 10 (ref 5–15)
BUN: 14 mg/dL (ref 6–20)
CO2: 21 mmol/L — ABNORMAL LOW (ref 22–32)
Calcium: 8.8 mg/dL — ABNORMAL LOW (ref 8.9–10.3)
Chloride: 110 mmol/L (ref 98–111)
Creatinine, Ser: 1.02 mg/dL — ABNORMAL HIGH (ref 0.44–1.00)
GFR calc Af Amer: >60 mL/min (ref >60)
GFR calc non Af Amer: >60 mL/min (ref >60)
Glucose, Bld: 91 mg/dL (ref 70–99)
Potassium: 3.5 mmol/L (ref 3.5–5.1)
Sodium: 141 mmol/L (ref 135–145)
Total Bilirubin: 0.5 mg/dL (ref 0.3–1.2)
Total Protein: 6.2 g/dL — ABNORMAL LOW (ref 6.5–8.1)

## 2018-12-14 LAB — GLUCOSE, CAPILLARY
Glucose-Capillary: 110 mg/dL — ABNORMAL HIGH (ref 70–99)
Glucose-Capillary: 100 mg/dL — ABNORMAL HIGH (ref 70–99)
Glucose-Capillary: 86 mg/dL (ref 70–99)
Glucose-Capillary: 114 mg/dL — ABNORMAL HIGH (ref 70–99)
Glucose-Capillary: 69 mg/dL — ABNORMAL LOW (ref 70–99)

## 2018-12-14 LAB — CBC
HCT: 40.2 % (ref 36.0–46.0)
Hemoglobin: 12.5 g/dL (ref 12.0–15.0)
MCH: 28.7 pg (ref 26.0–34.0)
MCHC: 31.1 g/dL (ref 30.0–36.0)
MCV: 92.2 fL (ref 80.0–100.0)
Platelets: 184 10*3/uL (ref 150–400)
RBC: 4.36 MIL/uL (ref 3.87–5.11)
RDW: 13.2 % (ref 11.5–15.5)
WBC: 7.1 10*3/uL (ref 4.0–10.5)
nRBC: 0 % (ref 0.0–0.2)

## 2018-12-14 LAB — HEMOGLOBIN A1C
Hgb A1c MFr Bld: 8.5 % — ABNORMAL HIGH (ref 4.8–5.6)
Mean Plasma Glucose: 197.25 mg/dL

## 2018-12-14 MED ORDER — RISPERIDONE 0.25 MG PO TABS
0.5000 mg | ORAL_TABLET | Freq: Every day | ORAL | Status: AC
  Administered 2018-12-14: 21:00:00 0.5 mg via ORAL
  Filled 2018-12-14: qty 2

## 2018-12-14 MED ORDER — HYDROXYZINE HCL 25 MG PO TABS
25.0000 mg | ORAL_TABLET | Freq: Three times a day (TID) | ORAL | Status: DC | PRN
  Administered 2018-12-14 – 2018-12-18 (×8): 25 mg via ORAL
  Filled 2018-12-14 (×8): qty 1

## 2018-12-14 MED ORDER — TRAZODONE HCL 50 MG PO TABS
50.0000 mg | ORAL_TABLET | Freq: Every evening | ORAL | Status: DC | PRN
  Administered 2018-12-14: 21:00:00 50 mg via ORAL
  Filled 2018-12-14: qty 1

## 2018-12-14 MED ORDER — MUSCLE RUB 10-15 % EX CREA
TOPICAL_CREAM | CUTANEOUS | Status: DC | PRN
  Administered 2018-12-14: 16:00:00 via TOPICAL
  Administered 2018-12-14: 1 via TOPICAL
  Filled 2018-12-14: qty 85

## 2018-12-14 MED ORDER — RISPERIDONE 1 MG PO TABS
1.0000 mg | ORAL_TABLET | Freq: Two times a day (BID) | ORAL | Status: DC
  Administered 2018-12-15 – 2018-12-18 (×8): 1 mg via ORAL
  Filled 2018-12-14 (×7): qty 1

## 2018-12-14 NOTE — Progress Notes (Signed)
Spoke with [employer] at [employer] in regards to patient.  They will continue to follow for this time.  Virginia Rochester, RN

## 2018-12-14 NOTE — Progress Notes (Signed)
Pt arrived to floor anxious and depressed. Pt stated "If I had a gun right now I'd kill myself," "I wish I had died." Pt also complained of inability to urinate, bladder scan resulted >931mL, MD paged and order to I&O cath received, 1000 mL drained. IV patent. Sitter at bedside. Will continue to monitor.

## 2018-12-14 NOTE — Progress Notes (Addendum)
Hypoglycemic Event  CBG: 69  Treatment: 4 oz juice/soda  Symptoms: None  Follow-up CBG: Time: 2219 CBG Result: 114  Possible Reasons for Event: Inadequate meal intake  Comments/MD notified: On Call notified     Kizzie Ide

## 2018-12-14 NOTE — Consult Note (Signed)
Telepsych Consultation   Reason for Consult:  Overdose Referring Physician:  Dr. Noralee StainJennifer [name]   Location of Patient: [id number] Location of Provider: [facility]  Patient Identification: [name] [name] Mittelstaedt MRN:  [id number] Principal Diagnosis: Suicide attempt [facility](HCC) Diagnosis:  Principal Problem:   Suicide attempt [facility](HCC) Active Problems:   T2DM (type 2 diabetes mellitus) (HCC)   Essential hypertension   Tobacco use disorder   Bipolar disorder with moderate depression (HCC)   Overdose, intentional self-harm, initial encounter (HCC)   Total Time spent with patient: 1 hour  Subjective:   [name] [name] Burnette is a 48 y.o. female patient admitted with overdose.  HPI:   Per chart review, patient was admitted with intentional overdose of Lisinopril 10 mg tablets (#50) and Benadryl 25 mg tablets (#50) in a suicide attempt. She reports trying to cut her wrists over the past few days as well. She reports multiple stressors including eviction from her trailer. Her son just got married and has little time to help her. Per recent nursing note she reported, "If I had a gun right now I'd kill myself. I wish I had died."   Of note, she was [name] for mania with psychosis. She was discharged on Tegretol 200 mg BID, Risperdal 2 mg q am and 4 mg qhs and Trazodone 200 mg qhs.  On interview, [name] reports, "I don't want to live anymore." She admits to ingesting Lisinopril and Benadryl as well as 3-4 tablets of Xanax that she got from a friend. 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 She reports worsening depression since October. She reports generalized worries. She endorses SI. She denies a history of suicide attempts. She denies HI or AVH. She does admit to seeing "intermittent flashes." She feels paranoid at times that people are judging her and has felt that the television was speaking to her. She reports  problems with forgetfulness. She often loses or misplaces her belongings. She reports onset of memory problems after hitting her head by accident on railroad tracks in March. She was diagnosed with bipolar disorder in her 4720s. She reports last feeling manic in December.  She does not remember the last time she had her medications. She reports problems with sleep.   Past Psychiatric History: Bipolar disorder  Risk to Self:  Yes endorses SI and recent suicide attempt.  Risk to Others:  None. Denies HI.  Prior Inpatient Therapy:  [facility] for mania with psychosis.  Prior Outpatient Therapy:  She is followed at [facility].   Past Medical History:  Past Medical History:  Diagnosis Date  . Anxiety disorder   . Asthma   . Bipolar affective disorder (HCC)   . Depression   . Diabetes mellitus   . Migraine   . Schizophrenia [facility](HCC)     Past Surgical History:  Procedure Laterality Date  . KNEE ARTHROSCOPY     x 2  . KNEE SURGERY Left    Incision made and knee cleaned out  . TUBAL LIGATION     Family History:  Family History  Problem Relation Age of Onset  . Bipolar disorder Mother   . Hypertension Father   . Diabetes Father    Family Psychiatric  History: Bipolar disorder-mother  Social History:  Social History   Substance and Sexual Activity  Alcohol Use Not Currently   Comment:  occasionally     Social History   Substance and Sexual Activity  Drug Use No    Social History   Socioeconomic History  . Marital status: Divorced    Spouse name: Not on file  . Number of children: Not on file  . Years of education: Not on file  . Highest education level: Not on file  Occupational History  . Not on file  Social Needs  . Financial resource strain: Not on file  . Food insecurity    Worry: Not on file    Inability: Not on file  . Transportation needs    Medical: Not on file    Non-medical: Not on file  Tobacco Use  .  Smoking status: Current Every Day Smoker    Packs/day: 1.00    Years: 29.00    Pack years: 29.00    Types: Cigarettes  . Smokeless tobacco: Never Used  Substance and Sexual Activity  . Alcohol use: Not Currently    Comment: occasionally  . Drug use: No  . Sexual activity: Yes    Birth control/protection: Surgical  Lifestyle  . Physical activity    Days per week: Not on file    Minutes per session: Not on file  . Stress: Not on file  Relationships  . Social Herbalist on phone: Not on file    Gets together: Not on file    Attends religious service: Not on file    Active member of club or organization: Not on file    Attends meetings of clubs or organizations: Not on file    Relationship status: Not on file  Other Topics Concern  . Not on file  Social History Narrative   Patient lives in [place] with her 2 kids- 67 in 59 years old.   She has total 4 kids.   She is a single mom.         Additional Social History: She lives alone. She is single. She has 4 adult children. She does not have much contact with her children. She denies alcohol or illicit substance use.     Allergies:   Allergies  Allergen Reactions  . Tramadol Nausea Only    "feeling like I'm on fire"  . Other Other (See Comments)    Pt reports being allergic to nicotine patches only.  She can use the gum  . Nicotine Rash    Patient states she is allergic to the Nicotine patch and they cause her to break out in a rash.  She states she can smoke cigarettes.    Labs:  Results for orders placed or performed during the hospital encounter of 12/13/18 (from the past 48 hour([name]))  Rapid urine drug screen (hospital performed)     Status: Abnormal   Collection Time: 12/13/18  2:44 PM  Result Value Ref Range   Opiates NONE DETECTED NONE DETECTED   Cocaine NONE DETECTED NONE DETECTED   Benzodiazepines POSITIVE (A) NONE DETECTED   Amphetamines NONE DETECTED NONE DETECTED   Tetrahydrocannabinol NONE  DETECTED NONE DETECTED   Barbiturates NONE DETECTED NONE DETECTED    Comment: (NOTE) DRUG SCREEN FOR MEDICAL PURPOSES ONLY.  IF CONFIRMATION IS NEEDED FOR ANY PURPOSE, NOTIFY LAB WITHIN 5 DAYS. LOWEST DETECTABLE LIMITS FOR URINE DRUG SCREEN Drug Class                     Cutoff (ng/mL) Amphetamine and metabolites    1000 Barbiturate and metabolites  200 Benzodiazepine                 200 Tricyclics and metabolites     300 Opiates and metabolites        300 Cocaine and metabolites        300 THC                            50 Performed at [facility], [address] [place], [place] [address]   Comprehensive metabolic panel     Status: Abnormal   Collection Time: 12/13/18  2:50 PM  Result Value Ref Range   Sodium 139 135 - 145 mmol/L   Potassium 3.8 3.5 - 5.1 mmol/L   Chloride 106 98 - 111 mmol/L   CO2 20 (L) 22 - 32 mmol/L   Glucose, Bld 203 (H) 70 - 99 mg/dL   BUN 15 6 - 20 mg/dL   Creatinine, Ser 6.041.11 (H) 0.44 - 1.00 mg/dL   Calcium 8.7 (L) 8.9 - 10.3 mg/dL   Total Protein 6.8 6.5 - 8.1 g/dL   Albumin 4.2 3.5 - 5.0 g/dL   AST 11 (L) 15 - 41 U/L   ALT 12 0 - 44 U/L   Alkaline Phosphatase 70 38 - 126 U/L   Total Bilirubin 0.4 0.3 - 1.2 mg/dL   GFR calc non Af Amer 58 (L) >60 mL/min   GFR calc Af Amer >60 >60 mL/min   Anion gap 13 5 - 15    Comment: Performed at [facility], [address] [place], [place] [address]  Ethanol     Status: None   Collection Time: 12/13/18  2:50 PM  Result Value Ref Range   Alcohol, Ethyl (B) <10 <10 mg/dL    Comment: (NOTE) Lowest detectable limit for serum alcohol is 10 mg/dL. For medical purposes only. Performed at [facility], [address] [place], [place] [address]   Salicylate level     Status: None   Collection Time: 12/13/18  2:50 PM  Result Value Ref Range   Salicylate Lvl <7.0 2.8 - 30.0 mg/dL    Comment: Performed at [facility], [address] [place], [place] [address]  Acetaminophen level     Status: Abnormal   Collection Time: 12/13/18  2:50 PM  Result Value Ref Range   Acetaminophen (Tylenol), Serum <10 (L) 10 - 30 ug/mL    Comment: (NOTE) Therapeutic concentrations vary significantly. A range of 10-30 ug/mL  may be an effective concentration for many patients. However, some  are best treated at concentrations outside of this range. Acetaminophen concentrations >150 ug/mL at 4 hours after ingestion  and >50 ug/mL at 12 hours after ingestion are often associated with  toxic reactions. Performed at [facility], [address] [place], [place] [address]   cbc     Status: None   Collection Time: 12/13/18  2:50 PM  Result Value Ref Range   WBC 9.7 4.0 - 10.5 K/uL   RBC 4.66 3.87 - 5.11 MIL/uL   Hemoglobin 13.6 12.0 - 15.0 g/dL   HCT 86.542.5 78.436.0 - 69.646.0 %   MCV 91.2 80.0 - 100.0 fL   MCH 29.2 26.0 - 34.0 pg   MCHC 32.0 30.0 - 36.0 g/dL   RDW 29.513.2 28.411.5 - 13.215.5 %   Platelets 181 150 - 400 K/uL   nRBC 0.0 0.0 - 0.2 %  Comment: Performed at [facility], [address] [place], [place] [address]  I-Stat beta hCG blood, ED     Status: None   Collection Time: 12/13/18  2:53 PM  Result Value Ref Range   I-stat hCG, quantitative <5.0 <5 mIU/mL   Comment 3            Comment:   GEST. AGE      CONC.  (mIU/mL)   <=1 WEEK        5 - 50     2 WEEKS       50 - 500     3 WEEKS       100 - 10,000     4 WEEKS     1,000 - 30,000        FEMALE AND NON-PREGNANT FEMALE:     LESS THAN 5 mIU/mL   Magnesium     Status: None   Collection Time: 12/13/18  3:06 PM  Result Value Ref Range   Magnesium 1.7 1.7 - 2.4 mg/dL    Comment: Performed at [facility], [address] [place], [place] [address]  Carbamazepine level, total     Status: Abnormal   Collection Time: 12/13/18  3:06 PM  Result Value Ref Range   Carbamazepine Lvl <2.0 (L) 4.0 - 12.0 ug/mL    Comment:  Performed at [facility], [address] [place], [place] [address]  Ammonia     Status: Abnormal   Collection Time: 12/13/18  3:06 PM  Result Value Ref Range   Ammonia <9 (L) 9 - 35 umol/L    Comment: Performed at [facility], [address], [place], [place] [address]  SARS Coronavirus 2 (CEPHEID - Performed in [facility] hospital lab), Hosp Order     Status: None   Collection Time: 12/13/18  6:29 PM   Specimen: Nasopharyngeal Swab  Result Value Ref Range   SARS Coronavirus 2 NEGATIVE NEGATIVE    Comment: (NOTE) If result is NEGATIVE SARS-CoV-2 target nucleic acids are NOT DETECTED. The SARS-CoV-2 RNA is generally detectable in upper and lower  respiratory specimens during the acute phase of infection. The lowest  concentration of SARS-CoV-2 viral copies this assay can detect is 250  copies / mL. A negative result does not preclude SARS-CoV-2 infection  and should not be used as the sole basis for treatment or other  patient management decisions.  A negative result may occur with  improper specimen collection / handling, submission of specimen other  than nasopharyngeal swab, presence of viral mutation([name]) within the  areas targeted by this assay, and inadequate number of viral copies  (<250 copies / mL). A negative result must be combined with clinical  observations, patient history, and epidemiological information. If result is POSITIVE SARS-CoV-2 target nucleic acids are DETECTED. The SARS-CoV-2 RNA is generally detectable in upper and lower  respiratory specimens dur ing the acute phase of infection.  Positive  results are indicative of active infection with SARS-CoV-2.  Clinical  correlation with patient history and other diagnostic information is  necessary to determine patient infection status.  Positive results do  not rule out bacterial infection or co-infection with other viruses. If result is PRESUMPTIVE POSTIVE SARS-CoV-2 nucleic acids MAY  BE PRESENT.   A presumptive positive result was obtained on the submitted specimen  and confirmed on repeat testing.  While 2019 novel coronavirus  (SARS-CoV-2) nucleic acids may be present in the submitted sample  additional confirmatory testing may be necessary for epidemiological  and / or clinical management purposes  to differentiate between  SARS-CoV-2 and other Sarbecovirus currently known to infect humans.  If clinically indicated additional testing with an alternate test  methodology [phone]) is advised. The SARS-CoV-2 RNA is generally  detectable in upper and lower respiratory sp ecimens during the acute  phase of infection. The expected result is Negative. Fact Sheet for Patients:  BoilerBrush.com.cy Fact Sheet for Healthcare Providers: https://pope.com/ This test is not yet approved or cleared by the [place] FDA and has been authorized for detection and/or diagnosis of SARS-CoV-2 by FDA under an Emergency Use Authorization (EUA).  This EUA will remain in effect (meaning this test can be used) for the duration of the COVID-19 declaration under Section 564(b)(1) of the Act, 21 U.[name].C. section 360bbb-3(b)(1), unless the authorization is terminated or revoked sooner. Performed at [facility], [address] [place], [place] [address]   CBG monitoring, ED     Status: Abnormal   Collection Time: 12/13/18  8:20 PM  Result Value Ref Range   Glucose-Capillary 182 (H) 70 - 99 mg/dL   Comment 1 Notify RN   Glucose, capillary     Status: Abnormal   Collection Time: 12/13/18 11:25 PM  Result Value Ref Range   Glucose-Capillary 146 (H) 70 - 99 mg/dL  Hemoglobin W1X     Status: Abnormal   Collection Time: 12/14/18  6:22 AM  Result Value Ref Range   Hgb A1c MFr Bld 8.5 (H) 4.8 - 5.6 %    Comment: (NOTE) Pre diabetes:          5.7%-6.4% Diabetes:              >6.4% Glycemic control for   <7.0% adults with  diabetes    Mean Plasma Glucose 197.25 mg/dL    Comment: Performed at [facility], [address], [place], [place] [address]  Comprehensive metabolic panel     Status: Abnormal   Collection Time: 12/14/18  6:22 AM  Result Value Ref Range   Sodium 141 135 - 145 mmol/L   Potassium 3.5 3.5 - 5.1 mmol/L   Chloride 110 98 - 111 mmol/L   CO2 21 (L) 22 - 32 mmol/L   Glucose, Bld 91 70 - 99 mg/dL   BUN 14 6 - 20 mg/dL   Creatinine, Ser 2.95 (H) 0.44 - 1.00 mg/dL   Calcium 8.8 (L) 8.9 - 10.3 mg/dL   Total Protein 6.2 (L) 6.5 - 8.1 g/dL   Albumin 3.8 3.5 - 5.0 g/dL   AST 11 (L) 15 - 41 U/L   ALT 13 0 - 44 U/L   Alkaline Phosphatase 59 38 - 126 U/L   Total Bilirubin 0.5 0.3 - 1.2 mg/dL   GFR calc non Af Amer >60 >60 mL/min   GFR calc Af Amer >60 >60 mL/min   Anion gap 10 5 - 15    Comment: Performed at [facility], [address] [place], [place] [address]  CBC     Status: None   Collection Time: 12/14/18  6:22 AM  Result Value Ref Range   WBC 7.1 4.0 - 10.5 K/uL   RBC 4.36 3.87 - 5.11 MIL/uL   Hemoglobin 12.5 12.0 - 15.0 g/dL   HCT 86.5 78.4 - 69.6 %   MCV 92.2 80.0 - 100.0 fL   MCH 28.7 26.0 - 34.0 pg   MCHC 31.1 30.0 - 36.0  g/dL   RDW 40.913.2 81.111.5 - 91.415.5 %   Platelets 184 150 - 400 K/uL   nRBC 0.0 0.0 - 0.2 %    Comment: Performed at [facility], [address] [place], [place] [address]  Glucose, capillary     Status: Abnormal   Collection Time: 12/14/18  8:40 AM  Result Value Ref Range   Glucose-Capillary 110 (H) 70 - 99 mg/dL  Glucose, capillary     Status: Abnormal   Collection Time: 12/14/18 11:48 AM  Result Value Ref Range   Glucose-Capillary 100 (H) 70 - 99 mg/dL    Medications:  Current Facility-Administered Medications  Medication Dose Route Frequency Provider Last Rate Last Dose  . acetaminophen (TYLENOL) tablet 650 mg  650 mg Oral Q6H PRN [place], Eric J, DO   650 mg at 12/14/18 0600   Or  . acetaminophen  (TYLENOL) suppository 650 mg  650 mg Rectal Q6H PRN [place], Alvira Philipsric J, DO      . enoxaparin (LOVENOX) injection 40 mg  40 mg Subcutaneous QHS [place], Alvira Philipsric J, DO   40 mg at 12/14/18 0032  . insulin aspart (novoLOG) injection 0-5 Units  0-5 Units Subcutaneous QHS [place], Eric J, DO      . insulin aspart (novoLOG) injection 0-9 Units  0-9 Units Subcutaneous TID WC [place], Eric J, DO      . MEDLINE mouth rinse  15 mL Mouth Rinse BID [place], Eric J, DO   15 mL at 12/14/18 0033  . Muscle Rub CREA   Topical PRN [place], Eric J, DO   1 application at 12/14/18 0243  . ondansetron (ZOFRAN) tablet 4 mg  4 mg Oral Q6H PRN [place], Eric J, DO   4 mg at 12/13/18 2258   Or  . ondansetron [facility](ZOFRAN) injection 4 mg  4 mg Intravenous Q6H PRN [place], Eric J, DO   4 mg at 12/14/18 [id number]  . senna-docusate (Senokot-[name]) tablet 1 tablet  1 tablet Oral QHS PRN [place], Eric J, DO        Musculoskeletal: Strength & Muscle Tone: No atrophy noted. Gait & Station: UTA since patient is lying in bed. Patient leans: N/A  Psychiatric Specialty Exam: Physical Exam  Nursing note and vitals reviewed. Constitutional: She is oriented to person, place, and time. She appears well-developed and well-nourished.  HENT:  Head: Normocephalic and atraumatic.  Neck: Normal range of motion.  Respiratory: Effort normal.  Musculoskeletal: Normal range of motion.  Neurological: She is alert and oriented to person, place, and time.  Psychiatric: Her speech is normal. She is slowed. Thought content is paranoid. Cognition and memory are impaired. She expresses impulsivity. She exhibits a depressed mood. She expresses suicidal ideation. She expresses suicidal plans.    Review of Systems  Cardiovascular: Positive for chest pain.  Gastrointestinal: Positive for abdominal pain and nausea. Negative for constipation, diarrhea and vomiting.  Psychiatric/Behavioral: Positive for depression and suicidal ideas. Negative for hallucinations and  substance abuse. The patient is nervous/anxious and has insomnia.   All other systems reviewed and are negative.   Blood pressure 121/76, pulse 85, temperature 98.3 F (36.8 C), temperature source Oral, resp. rate 20, height 5\' 4"  (1.626 m), weight 70.2 kg, SpO2 97 %.Body mass index is 26.57 kg/m.  General Appearance: Fairly Groomed, middle aged, Caucasian female, wearing a hospital gown who is lying in bed. NAD.   Eye Contact:  Good  Speech:  Clear and Coherent and Slow  Volume:  Decreased  Mood:  Depressed  Affect:  Congruent  Thought Process:  Goal Directed, Linear and Descriptions of Associations: Intact  Orientation:  Full (Time, Place, and Person)  Thought Content:  Logical and Paranoid Ideation, mild  Suicidal Thoughts:  Yes.  with intent/plan  Homicidal Thoughts:  No  Memory:  Immediate;   Fair Recent;   Fair Remote;   Fair  Judgement:  Fair  Insight:  Fair  Psychomotor Activity:  Decreased  Concentration:  Concentration: Good and Attention Span: Good  Recall:  Good  Fund of Knowledge:  Good  Language:  Good  Akathisia:  No  Handed:  Right  AIMS (if indicated):   N/A  Assets:  Communication Skills Housing Physical Health Resilience  ADL'[name]:  Intact  Cognition:  Patient exhibits short term memory deficits.   Sleep:   Poor   Assessment:  TIMMI DEVORA is a 48 y.o. female who was admitted with intentional overdose of Lisinopril 10 mg tablets (#50) and Benadryl 25 mg tablets (#50) in a suicide attempt. She endorses depressed mood in the setting of psychosocial stressors. She endorses mild paranoia. She endorses current SI. She warrants inpatient psychiatric hospitalization for stabilization and treatment.   Treatment Plan Summary: -Give one time dose Risperdal 0.5 mg tonight. Start Risperdal 1 mg BID for mood stabilization/psychosis on 7/7.  -Start Trazodone 50 mg qhs PRN for insomnia.  -EKG reviewed and QTc 472 (down from 511 yesterday). Please closely monitor when  starting or increasing QTc prolonging agents.  -Psychiatry will sign off on patient at this time. Please consult psychiatry again as needed.   Disposition: Recommend psychiatric Inpatient admission when medically cleared.  This service was provided via telemedicine using a 2-way, interactive audio and video technology.  Names of all persons participating in this telemedicine service and their role in this encounter. Name: Juanetta Beets, DO Role: Psychiatrist  Name: Otelia Limes Role: Patient    Cherly Beach, DO 12/14/2018 1:23 PM

## 2018-12-14 NOTE — Progress Notes (Signed)
PROGRESS NOTE    [name] [name]  HUD:[id number] DOB: [birth year] DOA: 12/13/2018 PCP: System, Pcp Not In     Brief Narrative:  [name] [name] is a 48 y.o. female with medical history significant of bipolar disorder, essential hypertension, type 2 diabetes mellitus who presents with intentional overdose and suicidal ideation.  Patient reports that she took #50 10 mg lisinopril tablets and #50 25 mg Benadryl tablets as well as benzodiazepine an hour before calling EMS.  She reports that "I have not been right on my medications" and "I want to die, and do not feel like living".  She reports that she is going to be evicted from her trailer and that her son just got married and now has very little time to help her.  She does report that she has tried to cut her wrists over the past few days as well.  Patient does not report any other concerns/complaints at this time other than her progressive depression and continued suicidal thoughts.  New events last 24 hours / Subjective: Continues to admit to suicidal ideation.  Has not been urinating today.  Sitter at bedside.  Assessment & Plan:   Principal Problem:   Overdose, intentional self-harm, initial encounter ([place]) Active Problems:   T2DM (type 2 diabetes mellitus) ([place])   Essential hypertension   Tobacco use disorder   Bipolar disorder with moderate depression ([place])   Intentional drug overdose, suicidal ideation -Patient ingested multiple tablets of lisinopril, Benadryl, benzodiazepine -Poison control contacted at time of admission -Suicide precaution, sitter at bedside -Psych consulted  Bipolar disorder -Currently not on medical treatment as an outpatient  Essential hypertension -Hold lisinopril.  Blood pressure stable this morning 121/76  Tobacco abuse -Cessation counseling, nicotine patch  Diabetes mellitus type 2 -Hemoglobin A1c 8.5 -Sliding-scale insulin  Acute urinary retention -In and out and bladder scan PRN   DVT  prophylaxis: Lovenox Code Status: Full Family Communication: None Disposition Plan: Psych consult pending   Consultants:   Psych  Procedures:   None  Antimicrobials:  Anti-infectives (From admission, onward)   None        Objective: Vitals:   12/13/18 2315 12/13/18 2320 12/14/18 0301 12/14/18 0432  BP: 123/86   121/76  Pulse: (!) 103   85  Resp: 18   20  Temp:  99.1 F (37.3 C)  98.3 F (36.8 C)  TempSrc:  Oral  Oral  SpO2: 96%   97%  Weight:   70.2 kg   Height:   5\' 4"  (1.626 m)     Intake/Output Summary (Last 24 hours) at 12/14/2018 1216 Last data filed at 12/14/2018 1200 Gross per 24 hour  Intake 240 ml  Output 2450 ml  Net -2210 ml   Filed Weights   12/14/18 0301  Weight: 70.2 kg    Examination:  General exam: Appears calm and comfortable  Respiratory system: Clear to auscultation. Respiratory effort normal. Cardiovascular system: S1 & S2 heard, RRR. No JVD, murmurs, rubs, gallops or clicks. No pedal edema. Gastrointestinal system: Abdomen is nondistended, soft and nontender. No organomegaly or masses felt. Normal bowel sounds heard. Central nervous system: Alert and oriented. No focal neurological deficits. Extremities: Symmetric 5 x 5 power. Skin: No rashes, lesions or ulcers Psychiatry: Judgement and insight appear poor, remains suicidal   Data Reviewed: I have personally reviewed following labs and imaging studies  CBC: Recent Labs  Lab 12/13/18 1450 12/14/18 0622  WBC 9.7 7.1  HGB 13.6 12.5  HCT 42.5 40.2  MCV 91.2 92.2  PLT 181 184   Basic Metabolic Panel: Recent Labs  Lab 12/13/18 1450 12/13/18 1506 12/14/18 0622  NA 139  --  141  K 3.8  --  3.5  CL 106  --  110  CO2 20*  --  21*  GLUCOSE 203*  --  91  BUN 15  --  14  CREATININE 1.11*  --  1.02*  CALCIUM 8.7*  --  8.8*  MG  --  1.7  --    GFR: Estimated Creatinine Clearance: 64.1 mL/min (A) (by C-G formula based on SCr of 1.02 mg/dL (H)). Liver Function Tests: Recent  Labs  Lab 12/13/18 1450 12/14/18 0622  AST 11* 11*  ALT 12 13  ALKPHOS 70 59  BILITOT 0.4 0.5  PROT 6.8 6.2*  ALBUMIN 4.2 3.8   No results for input(s): LIPASE, AMYLASE in the last 168 hours. Recent Labs  Lab 12/13/18 1506  AMMONIA <9*   Coagulation Profile: No results for input(s): INR, PROTIME in the last 168 hours. Cardiac Enzymes: No results for input(s): CKTOTAL, CKMB, CKMBINDEX, TROPONINI in the last 168 hours. BNP (last 3 results) No results for input(s): PROBNP in the last 8760 hours. HbA1C: Recent Labs    12/14/18 0622  HGBA1C 8.5*   CBG: Recent Labs  Lab 12/13/18 2020 12/13/18 2325 12/14/18 0840 12/14/18 1148  GLUCAP 182* 146* 110* 100*   Lipid Profile: No results for input(s): CHOL, HDL, LDLCALC, TRIG, CHOLHDL, LDLDIRECT in the last 72 hours. Thyroid Function Tests: No results for input(s): TSH, T4TOTAL, FREET4, T3FREE, THYROIDAB in the last 72 hours. Anemia Panel: No results for input(s): VITAMINB12, FOLATE, FERRITIN, TIBC, IRON, RETICCTPCT in the last 72 hours. Sepsis Labs: No results for input(s): PROCALCITON, LATICACIDVEN in the last 168 hours.  Recent Results (from the past 240 hour(s))  SARS Coronavirus 2 (CEPHEID - Performed [facility]), Hosp Order     Status: None   Collection Time: 12/13/18  6:29 PM   Specimen: Nasopharyngeal Swab  Result Value Ref Range Status   SARS Coronavirus 2 NEGATIVE NEGATIVE Final    Comment: (NOTE) If result is NEGATIVE SARS-CoV-2 target nucleic acids are NOT DETECTED. The SARS-CoV-2 RNA is generally detectable in upper and lower  respiratory specimens during the acute phase of infection. The lowest  concentration of SARS-CoV-2 viral copies this assay can detect is 250  copies / mL. A negative result does not preclude SARS-CoV-2 infection  and should not be used as the sole basis for treatment or other  patient management decisions.  A negative result may occur with  improper specimen  collection / handling, submission of specimen other  than nasopharyngeal swab, presence of viral mutation(s) within the  areas targeted by this assay, and inadequate number of viral copies  (<250 copies / mL). A negative result must be combined with clinical  observations, patient history, and epidemiological information. If result is POSITIVE SARS-CoV-2 target nucleic acids are DETECTED. The SARS-CoV-2 RNA is generally detectable in upper and lower  respiratory specimens dur ing the acute phase of infection.  Positive  results are indicative of active infection with SARS-CoV-2.  Clinical  correlation with patient history and other diagnostic information is  necessary to determine patient infection status.  Positive results do  not rule out bacterial infection or co-infection with other viruses. If result is PRESUMPTIVE POSTIVE SARS-CoV-2 nucleic acids MAY BE PRESENT.   A presumptive positive result was obtained on the submitted specimen  and  confirmed on repeat testing.  While 2019 novel coronavirus  (SARS-CoV-2) nucleic acids may be present in the submitted sample  additional confirmatory testing may be necessary for epidemiological  and / or clinical management purposes  to differentiate between  SARS-CoV-2 and other Sarbecovirus currently known to infect humans.  If clinically indicated additional testing with an alternate test  methodology [phone]) is advised. The SARS-CoV-2 RNA is generally  detectable in upper and lower respiratory sp ecimens during the acute  phase of infection. The expected result is Negative. Fact Sheet for Patients:  BoilerBrush.com.cy Fact Sheet for Healthcare Providers: https://pope.com/ This test is not yet approved or cleared by the [place] FDA and has been authorized for detection and/or diagnosis of SARS-CoV-2 by FDA under an Emergency Use Authorization (EUA).  This EUA will remain in effect  (meaning this test can be used) for the duration of the COVID-19 declaration under Section 564(b)(1) of the Act, 21 U.S.C. section 360bbb-3(b)(1), unless the authorization is terminated or revoked sooner. Performed at [facility], [address], [place], [place] [address]       Radiology Studies: No results found.    Scheduled Meds: . enoxaparin (LOVENOX) injection  40 mg Subcutaneous QHS  . insulin aspart  0-5 Units Subcutaneous QHS  . insulin aspart  0-9 Units Subcutaneous TID WC  . mouth rinse  15 mL Mouth Rinse BID   Continuous Infusions:   LOS: 0 days     Time spent: 35 minutes   Noralee Stain, DO [facility] [email or web address] 12/14/2018, 12:16 PM

## 2018-12-14 NOTE — Progress Notes (Signed)
Poison Control updated on pt status. VSS, inability to urinate, tremors, forgetful, dry mouth. Repeat EKG to be completed at 5am to monitor for prolonged QTc and QRS.

## 2018-12-15 DIAGNOSIS — T1491XA Suicide attempt, initial encounter: Secondary | ICD-10-CM

## 2018-12-15 LAB — BASIC METABOLIC PANEL
Anion gap: 10 (ref 5–15)
BUN: 13 mg/dL (ref 6–20)
CO2: 21 mmol/L — ABNORMAL LOW (ref 22–32)
Calcium: 8.6 mg/dL — ABNORMAL LOW (ref 8.9–10.3)
Chloride: 109 mmol/L (ref 98–111)
Creatinine, Ser: 0.81 mg/dL (ref 0.44–1.00)
GFR calc Af Amer: >60 mL/min (ref >60)
GFR calc non Af Amer: >60 mL/min (ref >60)
Glucose, Bld: 92 mg/dL (ref 70–99)
Potassium: 3.5 mmol/L (ref 3.5–5.1)
Sodium: 140 mmol/L (ref 135–145)

## 2018-12-15 LAB — GLUCOSE, CAPILLARY
Glucose-Capillary: 93 mg/dL (ref 70–99)
Glucose-Capillary: 96 mg/dL (ref 70–99)
Glucose-Capillary: 82 mg/dL (ref 70–99)
Glucose-Capillary: 84 mg/dL (ref 70–99)

## 2018-12-15 MED ORDER — SODIUM CHLORIDE 0.9 % IV BOLUS
500.0000 mL | Freq: Once | INTRAVENOUS | Status: AC
  Administered 2018-12-15: 500 mL via INTRAVENOUS

## 2018-12-15 MED ORDER — SODIUM CHLORIDE 0.9 % IV BOLUS
1000.0000 mL | Freq: Once | INTRAVENOUS | Status: AC
  Administered 2018-12-15: 1000 mL via INTRAVENOUS

## 2018-12-15 NOTE — Progress Notes (Signed)
PROGRESS NOTE    [name] [name]  ZOX:[id number] DOB: [birth year] DOA: 12/13/2018 PCP: System, Pcp Not In     Brief Narrative:  [name] DionesLocke S Slates is a 48 y.o. female with medical history significant of bipolar disorder, essential hypertension, type 2 diabetes mellitus who presents with intentional overdose and suicidal ideation.  Patient reports that she took #50 10 mg lisinopril tablets and #50 25 mg Benadryl tablets as well as benzodiazepine an hour before calling EMS.  She reports that "I have not been right on my medications" and "I want to die, and do not feel like living".  She reports that she is going to be evicted from her trailer and that her son just got married and now has very little time to help her.  She does report that she has tried to cut her wrists over the past few days as well.  Patient does not report any other concerns/complaints at this time other than her progressive depression and continued suicidal thoughts.  Patient evaluated by psychiatry, recommended for inpatient psych placement  New events last 24 hours / Subjective: Complains of some right-sided flank pain, headache, inability to urinate.  She had 3 times in and out cath since admission due to urinary retention.  Assessment & Plan:   Principal Problem:   Suicide attempt [facility](HCC) Active Problems:   T2DM (type 2 diabetes mellitus) ([facility])   Essential hypertension   Tobacco use disorder   Bipolar disorder with moderate depression ([facility])   Overdose, intentional self-harm, initial encounter ([facility])   Intentional drug overdose, suicidal ideation -Patient ingested multiple tablets of lisinopril, Benadryl, benzodiazepine -Poison control contacted at time of admission -Suicide precaution, sitter at bedside -Psych consulted, recommended for inpatient psych placement  Bipolar disorder -Currently not on medical treatment as an outpatient -Started on Risperdal per psych recommendation  Essential hypertension -Hold  lisinopril.   Tobacco abuse -Cessation counseling, nicotine patch  Diabetes mellitus type 2 -Hemoglobin A1c 8.5 -Sliding-scale insulin  Acute urinary retention -Foley catheter placement   DVT prophylaxis: Lovenox Code Status: Full Family Communication: Attempted to call son at patient's request, no answer Disposition Plan: Inpatient psych placement, patient is medically stable for discharge once placement found   Consultants:   Psych  Procedures:   None  Antimicrobials:  Anti-infectives (From admission, onward)   None       Objective: Vitals:   12/14/18 2144 12/15/18 0610 12/15/18 0630 12/15/18 1137  BP: 98/64 (!) 85/52 (!) 80/52 99/60  Pulse: 78 71  85  Resp: 20 20  16   Temp: 98.6 F (37 C) 98.2 F (36.8 C)  98.3 F (36.8 C)  TempSrc: Oral Oral  Oral  SpO2: 91% 93%  94%  Weight:      Height:        Intake/Output Summary (Last 24 hours) at 12/15/2018 1140 Last data filed at 12/15/2018 0340 Gross per 24 hour  Intake 420 ml  Output 1265 ml  Net -845 ml   Filed Weights   12/14/18 0301  Weight: 70.2 kg    Examination: General exam: Appears calm and comfortable  Respiratory system: Clear to auscultation. Respiratory effort normal. Cardiovascular system: S1 & S2 heard, RRR. No JVD, murmurs, rubs, gallops or clicks. No pedal edema. Gastrointestinal system: Abdomen is nondistended, soft and nontender. No organomegaly or masses felt. Normal bowel sounds heard. Central nervous system: Alert and oriented. No focal neurological deficits. Extremities: Symmetric 5 x 5 power. Skin: No rashes, lesions or ulcers Psychiatry: Suicidal ideation  Data Reviewed: I have personally reviewed following labs and imaging studies  CBC: Recent Labs  Lab 12/13/18 1450 12/14/18 0622  WBC 9.7 7.1  HGB 13.6 12.5  HCT 42.5 40.2  MCV 91.2 92.2  PLT 181 419   Basic Metabolic Panel: Recent Labs  Lab 12/13/18 1450 12/13/18 1506 12/14/18 0622 12/15/18 0529  NA 139  --   141 140  K 3.8  --  3.5 3.5  CL 106  --  110 109  CO2 20*  --  21* 21*  GLUCOSE 203*  --  91 92  BUN 15  --  14 13  CREATININE 1.11*  --  1.02* 0.81  CALCIUM 8.7*  --  8.8* 8.6*  MG  --  1.7  --   --    GFR: Estimated Creatinine Clearance: 80.8 mL/min (by C-G formula based on SCr of 0.81 mg/dL). Liver Function Tests: Recent Labs  Lab 12/13/18 1450 12/14/18 0622  AST 11* 11*  ALT 12 13  ALKPHOS 70 59  BILITOT 0.4 0.5  PROT 6.8 6.2*  ALBUMIN 4.2 3.8   No results for input(s): LIPASE, AMYLASE in the last 168 hours. Recent Labs  Lab 12/13/18 1506  AMMONIA <9*   Coagulation Profile: No results for input(s): INR, PROTIME in the last 168 hours. Cardiac Enzymes: No results for input(s): CKTOTAL, CKMB, CKMBINDEX, TROPONINI in the last 168 hours. BNP (last 3 results) No results for input(s): PROBNP in the last 8760 hours. HbA1C: Recent Labs    12/14/18 0622  HGBA1C 8.5*   CBG: Recent Labs  Lab 12/14/18 1624 12/14/18 2146 12/14/18 2219 12/15/18 0808 12/15/18 1133  GLUCAP 86 69* 114* 93 96   Lipid Profile: No results for input(s): CHOL, HDL, LDLCALC, TRIG, CHOLHDL, LDLDIRECT in the last 72 hours. Thyroid Function Tests: No results for input(s): TSH, T4TOTAL, FREET4, T3FREE, THYROIDAB in the last 72 hours. Anemia Panel: No results for input(s): VITAMINB12, FOLATE, FERRITIN, TIBC, IRON, RETICCTPCT in the last 72 hours. Sepsis Labs: No results for input(s): PROCALCITON, LATICACIDVEN in the last 168 hours.  Recent Results (from the past 240 hour(s))  SARS Coronavirus 2 (CEPHEID - Performed in [place] hospital lab), Hosp Order     Status: None   Collection Time: 12/13/18  6:29 PM   Specimen: Nasopharyngeal Swab  Result Value Ref Range Status   SARS Coronavirus 2 NEGATIVE NEGATIVE Final    Comment: (NOTE) If result is NEGATIVE SARS-CoV-2 target nucleic acids are NOT DETECTED. The SARS-CoV-2 RNA is generally detectable in upper and lower  respiratory specimens  during the acute phase of infection. The lowest  concentration of SARS-CoV-2 viral copies this assay can detect is 250  copies / mL. A negative result does not preclude SARS-CoV-2 infection  and should not be used as the sole basis for treatment or other  patient management decisions.  A negative result may occur with  improper specimen collection / handling, submission of specimen other  than nasopharyngeal swab, presence of viral mutation(s) within the  areas targeted by this assay, and inadequate number of viral copies  (<250 copies / mL). A negative result must be combined with clinical  observations, patient history, and epidemiological information. If result is POSITIVE SARS-CoV-2 target nucleic acids are DETECTED. The SARS-CoV-2 RNA is generally detectable in upper and lower  respiratory specimens dur ing the acute phase of infection.  Positive  results are indicative of active infection with SARS-CoV-2.  Clinical  correlation with patient history and other diagnostic information  is  necessary to determine patient infection status.  Positive results do  not rule out bacterial infection or co-infection with other viruses. If result is PRESUMPTIVE POSTIVE SARS-CoV-2 nucleic acids MAY BE PRESENT.   A presumptive positive result was obtained on the submitted specimen  and confirmed on repeat testing.  While 2019 novel coronavirus  (SARS-CoV-2) nucleic acids may be present in the submitted sample  additional confirmatory testing may be necessary for epidemiological  and / or clinical management purposes  to differentiate between  SARS-CoV-2 and other Sarbecovirus currently known to infect humans.  If clinically indicated additional testing with an alternate test  methodology [phone]) is advised. The SARS-CoV-2 RNA is generally  detectable in upper and lower respiratory sp ecimens during the acute  phase of infection. The expected result is Negative. Fact Sheet for Patients:   BoilerBrush.com.cy Fact Sheet for Healthcare Providers: https://pope.com/ This test is not yet approved or cleared by the [place] FDA and has been authorized for detection and/or diagnosis of SARS-CoV-2 by FDA under an Emergency Use Authorization (EUA).  This EUA will remain in effect (meaning this test can be used) for the duration of the COVID-19 declaration under Section 564(b)(1) of the Act, 21 U.S.C. section 360bbb-3(b)(1), unless the authorization is terminated or revoked sooner. Performed at [facility], [address] [place], [place] [address]       Radiology Studies: No results found.    Scheduled Meds: . enoxaparin (LOVENOX) injection  40 mg Subcutaneous QHS  . insulin aspart  0-5 Units Subcutaneous QHS  . insulin aspart  0-9 Units Subcutaneous TID WC  . mouth rinse  15 mL Mouth Rinse BID  . risperiDONE  1 mg Oral BID   Continuous Infusions:   LOS: 1 day     Time spent: 25 minutes   Noralee Stain, DO [facility] [email or web address] 12/15/2018, 11:40 AM

## 2018-12-16 ENCOUNTER — Other Ambulatory Visit: Payer: Self-pay | Admitting: Behavioral Health

## 2018-12-16 LAB — BASIC METABOLIC PANEL
Anion gap: 8 (ref 5–15)
BUN: 15 mg/dL (ref 6–20)
CO2: 22 mmol/L (ref 22–32)
Calcium: 8.4 mg/dL — ABNORMAL LOW (ref 8.9–10.3)
Chloride: 110 mmol/L (ref 98–111)
Creatinine, Ser: 0.85 mg/dL (ref 0.44–1.00)
GFR calc Af Amer: >60 mL/min (ref >60)
GFR calc non Af Amer: >60 mL/min (ref >60)
Glucose, Bld: 89 mg/dL (ref 70–99)
Potassium: 4 mmol/L (ref 3.5–5.1)
Sodium: 140 mmol/L (ref 135–145)

## 2018-12-16 LAB — RAPID URINE DRUG SCREEN, HOSP PERFORMED
Amphetamines: NOT DETECTED
Barbiturates: NOT DETECTED
Benzodiazepines: NOT DETECTED
Cocaine: NOT DETECTED
Opiates: NOT DETECTED
Tetrahydrocannabinol: NOT DETECTED

## 2018-12-16 LAB — GLUCOSE, CAPILLARY
Glucose-Capillary: 94 mg/dL (ref 70–99)
Glucose-Capillary: 91 mg/dL (ref 70–99)
Glucose-Capillary: 92 mg/dL (ref 70–99)

## 2018-12-16 MED ORDER — TAMSULOSIN HCL 0.4 MG PO CAPS
0.4000 mg | ORAL_CAPSULE | Freq: Every day | ORAL | Status: DC
  Administered 2018-12-17: 17:00:00 0.4 mg via ORAL
  Filled 2018-12-16: qty 1

## 2018-12-16 MED ORDER — RISPERIDONE 1 MG PO TABS: 1.0000 mg | ORAL_TABLET | Freq: Two times a day (BID) | ORAL | Status: DC

## 2018-12-16 MED ORDER — TAMSULOSIN HCL 0.4 MG PO CAPS: 0.4000 mg | ORAL_CAPSULE | Freq: Every day | ORAL | Status: AC

## 2018-12-16 MED ORDER — TAMSULOSIN HCL 0.4 MG PO CAPS
0.4000 mg | ORAL_CAPSULE | Freq: Once | ORAL | Status: AC
  Administered 2018-12-16: 0.4 mg via ORAL
  Filled 2018-12-16: qty 1

## 2018-12-16 MED ORDER — TRAZODONE HCL 50 MG PO TABS: 50.0000 mg | ORAL_TABLET | Freq: Every evening | ORAL | Status: DC | PRN

## 2018-12-16 MED ORDER — INSULIN ASPART 100 UNIT/ML ~~LOC~~ SOLN: 0.0000 [IU] | Freq: Three times a day (TID) | SUBCUTANEOUS | 0 refills | Status: DC

## 2018-12-16 MED ORDER — HYDROXYZINE HCL 25 MG PO TABS: 25.0000 mg | ORAL_TABLET | Freq: Three times a day (TID) | ORAL | 0 refills | Status: DC | PRN

## 2018-12-16 NOTE — TOC Progression Note (Signed)
Transition of Care [facility]) - Progression Note    Patient Details  Name: [name] [name] MRN: [id number] Date of Birth: Aug 09, 1970  [facility] CM/SW Contact  Servando Snare, LCSW Phone Number: 12/16/2018, 2:37 PM  Clinical Narrative:  Patient is voluntary.  Patient has bed at [facility]. Patient had foley removed and can transfer once she has voided. LCSW notified floor RN to document once patient has voided.   LCSW will continue to follow for dc.          Expected Discharge Plan and Services           Expected Discharge Date: 12/16/18                                     Social Determinants of Health (SDOH) Interventions    Readmission Risk Interventions Readmission Risk Prevention Plan 12/16/2018  Transportation Screening Complete  Home Care Screening Complete  Some recent data might be hidden

## 2018-12-16 NOTE — Discharge Summary (Signed)
Physician Discharge Summary  PAILYN BELLEVUE EBR:[id number] DOB: January 26, 1971 DOA: 12/13/2018  PCP: System, Pcp Not In  Admit date: 12/13/2018 Discharge date: 12/16/2018  Admitted From: home Disposition:  Inpatient psych  Recommendations for Outpatient Follow-up:  1. Follow up with Psych  Home Health:no  Equipment/Devices: none   Discharge Condition: Stable CODE STATUS: FULL Diet recommendation: regular  Brief/Interim Summary: 48 y.o.femalewith medical history significant ofbipolar disorder, essential hypertension, type 2 diabetes mellitus who presents with intentional overdose and suicidal ideation. Patient reports that she took #5010 mg lisinopril tablets and#5025 mg Benadryl tablets as well as benzodiazepine an hour before calling EMS. She reports that "I have not been right on my medications" and "I want to die, and do not feel like living". She reports that she is going to be evicted from her trailer and that her son just got married and now has very little time to help her. She does report that she has tried to cut her wrists over the past few days as well. Patient does not report any other concerns/complaints at this time other than her progressive depression and continued suicidal thoughts.  Patient evaluated by psychiatry, recommended for inpatient psych placement At this time patient is medically stable for transfer to inpatient psychiatry.  Discharge Diagnoses:   Intentional drug overdose, suicidal ideation:-Patient ingested multiple tablets of lisinopril, Benadryl, benzodiazepine.Poison control contacted at time of admission.Suicide precaution, sitter at bedside.Psych consulted, recommended for inpatient psych placement. Patient is hemodynamically stable, blood pressure controlled, she is alert awake oriented. She is in agreement with transfer to inpatient psych for continued suicidal ideation.  Bipolar disorder: Continue Risperdal as per psych.  Essential hypertension:  Blood pressure controlled we have discontinued her lisinopril. Was low yesterday  Tobacco abuse:cessation counseling, nicotine patch  Diabetes mellitus type 2:Hemoglobin A1c 8.5.  She was prescribed 15 U Lantus daily and 5 units 3 times daily with meals ( looking at her home meds)  however not taking any insulin.  We will continue to hold home dose, continue on sliding scale insulin  With Novolog 0-9 units 3 times daily AC meals. Can resume home insulin once PO better and if sugar uncontrolled. Now sugar stable  Acute urinary retention: she needed foley catheter placement. Informed that psychiatry will not take her with Foley catheter, I have ordered catheter to be discontinued. Ordered Flomax. Can use PRN in and out cath. Encouraged ambulation.  DVT prophylaxis: Lovenox Code Status: Full Family Communication: Attempted to call son at patient's request, no answer Disposition Plan:  Patient is being transferred to inpatient psychiatry.    Discharge Instructions  Discharge Instructions    Diet Carb Modified   Complete by: As directed    Discharge instructions   Complete by: As directed    Please call call MD or return to ER for similar or worsening recurring problem that brought you to hospital or if any fever,nausea/vomiting,abdominal pain, uncontrolled pain, chest pain,  shortness of breath or any other alarming symptoms.  Please follow-up your doctor as instructed in a week time and call the office for appointment.  Please avoid alcohol, smoking, or any other illicit substance and maintain healthy habits including taking your regular medications as prescribed.  You were cared for by a hospitalist during your hospital stay. If you have any questions about your discharge medications or the care you received while you were in the hospital after you are discharged, you can call the unit and ask to speak with the hospitalist on call if  the hospitalist that took care of you is not  available.  Once you are discharged, your primary care physician will handle any further medical issues. Please note that NO REFILLS for any discharge medications will be authorized once you are discharged, as it is imperative that you return to your primary care physician (or establish a relationship with a primary care physician if you do not have one) for your aftercare needs so that they can reassess your need for medications and monitor your lab values   Increase activity slowly   Complete by: As directed      Allergies as of 12/16/2018      Reactions   Tramadol Nausea Only   "feeling like I'm on fire"   Other Other (See Comments)   Pt reports being allergic to nicotine patches only.  She can use the gum   Nicotine Rash   Patient states she is allergic to the Nicotine patch and they cause her to break out in a rash.  She states she can smoke cigarettes.      Medication List    STOP taking these medications   benztropine 0.5 MG tablet Commonly known as: COGENTIN   carbamazepine 200 MG tablet Commonly known as: TEGRETOL   ibuprofen 200 MG tablet Commonly known as: ADVIL   insulin aspart 100 UNIT/ML FlexPen Commonly known as: NovoLOG FlexPen Replaced by: insulin aspart 100 UNIT/ML injection   Insulin Detemir 100 UNIT/ML Pen Commonly known as: Levemir FlexTouch   lisinopril 10 MG tablet Commonly known as: ZESTRIL   promethazine 25 MG tablet Commonly known as: PHENERGAN     TAKE these medications   albuterol 108 (90 Base) MCG/ACT inhaler Commonly known as: VENTOLIN HFA Inhale 2 puffs into the lungs every 6 (six) hours as needed for wheezing or shortness of breath.   blood glucose meter kit and supplies Dispense based on patient and insurance preference. Use up to four times daily as directed. (FOR ICD-10 E10.9, E11.9).   hydrOXYzine 25 MG tablet Commonly known as: ATARAX/VISTARIL Take 1 tablet (25 mg total) by mouth 3 (three) times daily as needed for anxiety.    insulin aspart 100 UNIT/ML injection Commonly known as: novoLOG Inject 0-9 Units into the skin 3 (three) times daily with meals. Replaces: insulin aspart 100 UNIT/ML FlexPen   metFORMIN 1000 MG tablet Commonly known as: GLUCOPHAGE Take 0.5 tablets (500 mg total) by mouth 2 (two) times daily.   Pen Needles 3/16" 31G X 5 MM Misc 15 Units by Does not apply route daily.   risperiDONE 1 MG tablet Commonly known as: RISPERDAL Take 1 tablet (1 mg total) by mouth 2 (two) times daily. What changed:   medication strength  how much to take  how to take this  when to take this  additional instructions   tamsulosin 0.4 MG Caps capsule Commonly known as: FLOMAX Take 1 capsule (0.4 mg total) by mouth at bedtime for 7 days.   traZODone 50 MG tablet Commonly known as: DESYREL Take 1 tablet (50 mg total) by mouth at bedtime as needed for sleep. What changed:   medication strength  how much to take  when to take this  reasons to take this       Allergies  Allergen Reactions  . Tramadol Nausea Only    "feeling like I'm on fire"  . Other Other (See Comments)    Pt reports being allergic to nicotine patches only.  She can use the gum  . Nicotine Rash  Patient states she is allergic to the Nicotine patch and they cause her to break out in a rash.  She states she can smoke cigarettes.   Subjective: Still  C/o " I want to die" " I am not hungry " No CP/SOB, no fever. No other complaints Sitter at bedside  Discharge Exam: Vitals:   12/15/18 2053 12/16/18 0513  BP: 116/65 106/77  Pulse: 85 87  Resp: 16 15  Temp: 98.8 F (37.1 C) 98.5 F (36.9 C)  SpO2: 94% 97%   Vitals:   12/15/18 1427 12/15/18 1737 12/15/18 2053 12/16/18 0513  BP: (!) 83/59 (!) 101/58 116/65 106/77  Pulse: 84 79 85 87  Resp: '16 15 16 15  '$ Temp: 98.3 F (36.8 C) 98.7 F (37.1 C) 98.8 F (37.1 C) 98.5 F (36.9 C)  TempSrc:  Oral Oral Oral  SpO2: 92% 94% 94% 97%  Weight:      Height:         General: Pt is alert, awake, not in acute distress Cardiovascular: RRR, S1/S2 +, no rubs, no gallops Respiratory: CTA bilaterally, no wheezing, no rhonchi Abdominal: Soft, NT, ND, bowel sounds + Extremities: no edema, no cyanosis   The results of significant diagnostics from this hospitalization (including imaging, microbiology, ancillary and laboratory) are listed below for reference.     Microbiology: Recent Results (from the past 240 hour(s))  SARS Coronavirus 2 (CEPHEID - Performed in [place] hospital lab), Hosp Order     Status: None   Collection Time: 12/13/18  6:29 PM   Specimen: Nasopharyngeal Swab  Result Value Ref Range Status   SARS Coronavirus 2 NEGATIVE NEGATIVE Final    Comment: (NOTE) If result is NEGATIVE SARS-CoV-2 target nucleic acids are NOT DETECTED. The SARS-CoV-2 RNA is generally detectable in upper and lower  respiratory specimens during the acute phase of infection. The lowest  concentration of SARS-CoV-2 viral copies this assay can detect is 250  copies / mL. A negative result does not preclude SARS-CoV-2 infection  and should not be used as the sole basis for treatment or other  patient management decisions.  A negative result may occur with  improper specimen collection / handling, submission of specimen other  than nasopharyngeal swab, presence of viral mutation(s) within the  areas targeted by this assay, and inadequate number of viral copies  (<250 copies / mL). A negative result must be combined with clinical  observations, patient history, and epidemiological information. If result is POSITIVE SARS-CoV-2 target nucleic acids are DETECTED. The SARS-CoV-2 RNA is generally detectable in upper and lower  respiratory specimens dur ing the acute phase of infection.  Positive  results are indicative of active infection with SARS-CoV-2.  Clinical  correlation with patient history and other diagnostic information is  necessary to determine  patient infection status.  Positive results do  not rule out bacterial infection or co-infection with other viruses. If result is PRESUMPTIVE POSTIVE SARS-CoV-2 nucleic acids MAY BE PRESENT.   A presumptive positive result was obtained on the submitted specimen  and confirmed on repeat testing.  While 2019 novel coronavirus  (SARS-CoV-2) nucleic acids may be present in the submitted sample  additional confirmatory testing may be necessary for epidemiological  and / or clinical management purposes  to differentiate between  SARS-CoV-2 and other Sarbecovirus currently known to infect humans.  If clinically indicated additional testing with an alternate test  methodology [phone]) is advised. The SARS-CoV-2 RNA is generally  detectable in upper and lower respiratory  sp ecimens during the acute  phase of infection. The expected result is Negative. Fact Sheet for Patients:  StrictlyIdeas.no Fact Sheet for Healthcare Providers: BankingDealers.co.za This test is not yet approved or cleared by the [place] FDA and has been authorized for detection and/or diagnosis of SARS-CoV-2 by FDA under an Emergency Use Authorization (EUA).  This EUA will remain in effect (meaning this test can be used) for the duration of the COVID-19 declaration under Section 564(b)(1) of the Act, 21 U.S.C. section 360bbb-3(b)(1), unless the authorization is terminated or revoked sooner. Performed at [facility], [place] [address] [place], [place] [address]      Labs: BNP (last 3 results) No results for input(s): BNP in the last 8760 hours. Basic Metabolic Panel: Recent Labs  Lab 12/13/18 1450 12/13/18 1506 12/14/18 0622 12/15/18 0529 12/16/18 0627  NA 139  --  141 140 140  K 3.8  --  3.5 3.5 4.0  CL 106  --  110 109 110  CO2 20*  --  21* 21* 22  GLUCOSE 203*  --  91 92 89  BUN 15  --  '14 13 15  '$ CREATININE 1.11*  --  1.02* 0.81 0.85   CALCIUM 8.7*  --  8.8* 8.6* 8.4*  MG  --  1.7  --   --   --    Liver Function Tests: Recent Labs  Lab 12/13/18 1450 12/14/18 0622  AST 11* 11*  ALT 12 13  ALKPHOS 70 59  BILITOT 0.4 0.5  PROT 6.8 6.2*  ALBUMIN 4.2 3.8   No results for input(s): LIPASE, AMYLASE in the last 168 hours. Recent Labs  Lab 12/13/18 1506  AMMONIA <9*   CBC: Recent Labs  Lab 12/13/18 1450 12/14/18 0622  WBC 9.7 7.1  HGB 13.6 12.5  HCT 42.5 40.2  MCV 91.2 92.2  PLT 181 184   Cardiac Enzymes: No results for input(s): CKTOTAL, CKMB, CKMBINDEX, TROPONINI in the last 168 hours. BNP: Invalid input(s): POCBNP CBG: Recent Labs  Lab 12/15/18 0808 12/15/18 1133 12/15/18 1655 12/15/18 2129 12/16/18 0751  GLUCAP 93 96 82 84 94   D-Dimer No results for input(s): DDIMER in the last 72 hours. Hgb A1c Recent Labs    12/14/18 0622  HGBA1C 8.5*   Lipid Profile No results for input(s): CHOL, HDL, LDLCALC, TRIG, CHOLHDL, LDLDIRECT in the last 72 hours. Thyroid function studies No results for input(s): TSH, T4TOTAL, T3FREE, THYROIDAB in the last 72 hours.  Invalid input(s): FREET3 Anemia work up No results for input(s): VITAMINB12, FOLATE, FERRITIN, TIBC, IRON, RETICCTPCT in the last 72 hours. Urinalysis    Component Value Date/Time   COLORURINE RED (A) 01/03/2018 1720   APPEARANCEUR HAZY (A) 01/03/2018 1720   LABSPEC 1.028 01/03/2018 1720   PHURINE 6.0 01/03/2018 1720   GLUCOSEU >=500 (A) 01/03/2018 1720   HGBUR LARGE (A) 01/03/2018 1720   HGBUR trace-intact 11/25/2008 1530   BILIRUBINUR NEGATIVE 01/03/2018 1720   KETONESUR 20 (A) 01/03/2018 1720   PROTEINUR 30 (A) 01/03/2018 1720   UROBILINOGEN 0.2 05/12/2014 2315   NITRITE NEGATIVE 01/03/2018 1720   LEUKOCYTESUR MODERATE (A) 01/03/2018 1720   Sepsis Labs Invalid input(s): PROCALCITONIN,  WBC,  LACTICIDVEN Microbiology Recent Results (from the past 240 hour(s))  SARS Coronavirus 2 (CEPHEID - Performed in [place] hospital  lab), Hosp Order     Status: None   Collection Time: 12/13/18  6:29 PM   Specimen: Nasopharyngeal Swab  Result Value Ref Range Status  SARS Coronavirus 2 NEGATIVE NEGATIVE Final    Comment: (NOTE) If result is NEGATIVE SARS-CoV-2 target nucleic acids are NOT DETECTED. The SARS-CoV-2 RNA is generally detectable in upper and lower  respiratory specimens during the acute phase of infection. The lowest  concentration of SARS-CoV-2 viral copies this assay can detect is 250  copies / mL. A negative result does not preclude SARS-CoV-2 infection  and should not be used as the sole basis for treatment or other  patient management decisions.  A negative result may occur with  improper specimen collection / handling, submission of specimen other  than nasopharyngeal swab, presence of viral mutation(s) within the  areas targeted by this assay, and inadequate number of viral copies  (<250 copies / mL). A negative result must be combined with clinical  observations, patient history, and epidemiological information. If result is POSITIVE SARS-CoV-2 target nucleic acids are DETECTED. The SARS-CoV-2 RNA is generally detectable in upper and lower  respiratory specimens dur ing the acute phase of infection.  Positive  results are indicative of active infection with SARS-CoV-2.  Clinical  correlation with patient history and other diagnostic information is  necessary to determine patient infection status.  Positive results do  not rule out bacterial infection or co-infection with other viruses. If result is PRESUMPTIVE POSTIVE SARS-CoV-2 nucleic acids MAY BE PRESENT.   A presumptive positive result was obtained on the submitted specimen  and confirmed on repeat testing.  While 2019 novel coronavirus  (SARS-CoV-2) nucleic acids may be present in the submitted sample  additional confirmatory testing may be necessary for epidemiological  and / or clinical management purposes  to differentiate between   SARS-CoV-2 and other Sarbecovirus currently known to infect humans.  If clinically indicated additional testing with an alternate test  methodology [phone]) is advised. The SARS-CoV-2 RNA is generally  detectable in upper and lower respiratory sp ecimens during the acute  phase of infection. The expected result is Negative. Fact Sheet for Patients:  StrictlyIdeas.no Fact Sheet for Healthcare Providers: BankingDealers.co.za This test is not yet approved or cleared by the [place] FDA and has been authorized for detection and/or diagnosis of SARS-CoV-2 by FDA under an Emergency Use Authorization (EUA).  This EUA will remain in effect (meaning this test can be used) for the duration of the COVID-19 declaration under Section 564(b)(1) of the Act, 21 U.S.C. section 360bbb-3(b)(1), unless the authorization is terminated or revoked sooner. Performed at [facility], [place] [address], [place], [place] [address]      Time coordinating discharge: 25 minutes  SIGNED:   Antonieta Pert, MD  [facility] 12/16/2018, 1:28 PM  If 7PM-7AM, please contact night-coverage [email or web address]

## 2018-12-17 LAB — GLUCOSE, CAPILLARY
Glucose-Capillary: 181 mg/dL — ABNORMAL HIGH (ref 70–99)
Glucose-Capillary: 110 mg/dL — ABNORMAL HIGH (ref 70–99)
Glucose-Capillary: 123 mg/dL — ABNORMAL HIGH (ref 70–99)
Glucose-Capillary: 138 mg/dL — ABNORMAL HIGH (ref 70–99)
Glucose-Capillary: 120 mg/dL — ABNORMAL HIGH (ref 70–99)
Glucose-Capillary: 157 mg/dL — ABNORMAL HIGH (ref 70–99)

## 2018-12-17 MED ORDER — MAGNESIUM CITRATE PO SOLN
0.5000 | Freq: Once | ORAL | Status: AC
  Administered 2018-12-17: 11:00:00 0.5 via ORAL
  Filled 2018-12-17: qty 296

## 2018-12-17 NOTE — Progress Notes (Signed)
Patient unable to discharge due to not voiding. Bladder scan >569mL. In and out cath @ 0315 with 776mL. Patient is questioning why she is unable to void. Will pass on to day shift.

## 2018-12-17 NOTE — Progress Notes (Signed)
Patient states she has the urge to void and cant. Bladder scan on 3 attempts states > 532mL. Will page on call. Will continue to monitor.

## 2018-12-17 NOTE — Progress Notes (Signed)
PROGRESS NOTE    [name] [name]  GEX:[id number] DOB: 06-05-1971 DOA: 12/13/2018 PCP: System, Pcp Not In    Brief Narrative:  48 year old female with history of bipolar disorder, hypertension, type 2 diabetes admitted to the hospital with intentional drug overdose and suicidal ideation.  Taken overdoses of lisinopril, Benadryl and benzodiazepine.  Evaluated by psychiatry.  Patient voluntarily willing to go to inpatient psych. Is staying in the hospital with urinary retention.   Assessment & Plan:   Principal Problem:   Suicide attempt [facility]) Active Problems:   T2DM (type 2 diabetes mellitus) (HCC)   Essential hypertension   Tobacco use disorder   Bipolar disorder with moderate depression (HCC)   Overdose, intentional self-harm, initial encounter (HCC)  Intentional drug overdose, suicidal ideation:-Patient ingested multiple tablets of lisinopril, Benadryl, benzodiazepine.Poison control contacted at time of admission.Suicide precaution, sitter at bedside.Psych consulted,recommended for inpatient psych placement. Patient is hemodynamically stable, blood pressure controlled, she is alert awake oriented. She is in agreement with transfer to inpatient psych for continued suicidal ideation.  Bipolar disorder: Continue Risperdal as per psych.  Essential hypertension: Blood pressure controlled   Diabetes mellitus type 2:Hemoglobin A1c 8.5.  She was prescribed 15 U Lantus daily and 5 units 3 times daily with meals ( looking at her home meds)  however not taking any insulin.  We will continue to hold home dose, continue on sliding scale insulin  With Novolog 0-9 units 3 times daily AC meals. Can resume home insulin once PO better and if sugar uncontrolled. Now sugar stable.  Acute urinary retention: Patient needed Foley catheter placement, was given voiding trial yesterday with no success.  She retained overnight.  This is probably due to overdose on anticholinergic effect of Benadryl.  No  evidence of acute infection.  No evidence of outlet obstruction. Will avoid reinserting Foley catheter. Every 6 hours bladder scan and straight cath for urinary retention more than 500 mL. Let Benadryl wash of the system. Patient also has constipation, will give a dose of magnesium citrate to see if that helps.  DVT prophylaxis: Lovenox Code Status: Full code Family Communication: None Disposition Plan: Inpatient psychiatry.  Pending spontaneous voiding.  Inpatient psychiatry not able to take patient with Foley catheter.   Consultants:   Psych  Procedures:   None  Antimicrobials:   None   Subjective: Patient seen and examined.  Overnight events noted.  Patient was not able to void.  Had 700 mL retention.  She is actively suicidal and tearful.  Sitter at the bedside.  Denies any other symptoms.  Objective: Vitals:   12/16/18 2154 12/16/18 2327 12/17/18 0628 12/17/18 0704  BP: (!) 89/50 (!) 96/54 (!) 104/59 105/71  Pulse: 80 67 83 83  Resp: 18  18 16   Temp: 99 F (37.2 C)  98.2 F (36.8 C) 97.9 F (36.6 C)  TempSrc: Oral  Oral Oral  SpO2: 95%  95% 95%  Weight:      Height:        Intake/Output Summary (Last 24 hours) at 12/17/2018 1216 Last data filed at 12/17/2018 1200 Gross per 24 hour  Intake 1195 ml  Output 1600 ml  Net -405 ml   Filed Weights   12/14/18 0301  Weight: 70.2 kg    Examination:  General exam: Appears calm and comfortable, anxious Respiratory system: Clear to auscultation. Respiratory effort normal. Cardiovascular system: S1 & S2 heard, RRR. No JVD, murmurs, rubs, gallops or clicks. No pedal edema. Gastrointestinal system: Abdomen is nondistended, soft  and nontender. No organomegaly or masses felt. Normal bowel sounds heard. Central nervous system: Alert and oriented. No focal neurological deficits. Extremities: Symmetric 5 x 5 power. Skin: No rashes, lesions or ulcers Psychiatry: Judgement and insight appear normal.  Patient stated she  feels suicidal with depressed mood.    Data Reviewed: I have personally reviewed following labs and imaging studies  CBC: Recent Labs  Lab 12/13/18 1450 12/14/18 0622  WBC 9.7 7.1  HGB 13.6 12.5  HCT 42.5 40.2  MCV 91.2 92.2  PLT 181 073   Basic Metabolic Panel: Recent Labs  Lab 12/13/18 1450 12/13/18 1506 12/14/18 0622 12/15/18 0529 12/16/18 0627  NA 139  --  141 140 140  K 3.8  --  3.5 3.5 4.0  CL 106  --  110 109 110  CO2 20*  --  21* 21* 22  GLUCOSE 203*  --  91 92 89  BUN 15  --  14 13 15   CREATININE 1.11*  --  1.02* 0.81 0.85  CALCIUM 8.7*  --  8.8* 8.6* 8.4*  MG  --  1.7  --   --   --    GFR: Estimated Creatinine Clearance: 77 mL/min (by C-G formula based on SCr of 0.85 mg/dL). Liver Function Tests: Recent Labs  Lab 12/13/18 1450 12/14/18 0622  AST 11* 11*  ALT 12 13  ALKPHOS 70 59  BILITOT 0.4 0.5  PROT 6.8 6.2*  ALBUMIN 4.2 3.8   No results for input(s): LIPASE, AMYLASE in the last 168 hours. Recent Labs  Lab 12/13/18 1506  AMMONIA <9*   Coagulation Profile: No results for input(s): INR, PROTIME in the last 168 hours. Cardiac Enzymes: No results for input(s): CKTOTAL, CKMB, CKMBINDEX, TROPONINI in the last 168 hours. BNP (last 3 results) No results for input(s): PROBNP in the last 8760 hours. HbA1C: No results for input(s): HGBA1C in the last 72 hours. CBG: Recent Labs  Lab 12/16/18 1624 12/16/18 2152 12/17/18 0625 12/17/18 0833 12/17/18 1154  GLUCAP 92 181* 110* 123* 138*   Lipid Profile: No results for input(s): CHOL, HDL, LDLCALC, TRIG, CHOLHDL, LDLDIRECT in the last 72 hours. Thyroid Function Tests: No results for input(s): TSH, T4TOTAL, FREET4, T3FREE, THYROIDAB in the last 72 hours. Anemia Panel: No results for input(s): VITAMINB12, FOLATE, FERRITIN, TIBC, IRON, RETICCTPCT in the last 72 hours. Sepsis Labs: No results for input(s): PROCALCITON, LATICACIDVEN in the last 168 hours.  Recent Results (from the past 240  hour(s))  SARS Coronavirus 2 (CEPHEID - Performed in [place] hospital lab), Hosp Order     Status: None   Collection Time: 12/13/18  6:29 PM   Specimen: Nasopharyngeal Swab  Result Value Ref Range Status   SARS Coronavirus 2 NEGATIVE NEGATIVE Final    Comment: (NOTE) If result is NEGATIVE SARS-CoV-2 target nucleic acids are NOT DETECTED. The SARS-CoV-2 RNA is generally detectable in upper and lower  respiratory specimens during the acute phase of infection. The lowest  concentration of SARS-CoV-2 viral copies this assay can detect is 250  copies / mL. A negative result does not preclude SARS-CoV-2 infection  and should not be used as the sole basis for treatment or other  patient management decisions.  A negative result may occur with  improper specimen collection / handling, submission of specimen other  than nasopharyngeal swab, presence of viral mutation(s) within the  areas targeted by this assay, and inadequate number of viral copies  (<250 copies / mL). A negative result must be  combined with clinical  observations, patient history, and epidemiological information. If result is POSITIVE SARS-CoV-2 target nucleic acids are DETECTED. The SARS-CoV-2 RNA is generally detectable in upper and lower  respiratory specimens dur ing the acute phase of infection.  Positive  results are indicative of active infection with SARS-CoV-2.  Clinical  correlation with patient history and other diagnostic information is  necessary to determine patient infection status.  Positive results do  not rule out bacterial infection or co-infection with other viruses. If result is PRESUMPTIVE POSTIVE SARS-CoV-2 nucleic acids MAY BE PRESENT.   A presumptive positive result was obtained on the submitted specimen  and confirmed on repeat testing.  While 2019 novel coronavirus  (SARS-CoV-2) nucleic acids may be present in the submitted sample  additional confirmatory testing may be necessary for  epidemiological  and / or clinical management purposes  to differentiate between  SARS-CoV-2 and other Sarbecovirus currently known to infect humans.  If clinically indicated additional testing with an alternate test  methodology [phone] is advised. The SARS-CoV-2 RNA is generally  detectable in upper and lower respiratory sp ecimens during the acute  phase of infection. The expected result is Negative. Fact Sheet for Patients:  BoilerBrush.com.cyhttps://www.fda.gov/media/136312/download Fact Sheet for Healthcare Providers: https://pope.com/https://www.fda.gov/media/136313/download This test is not yet approved or cleared by the [place] FDA and has been authorized for detection and/or diagnosis of SARS-CoV-2 by FDA under an Emergency Use Authorization (EUA).  This EUA will remain in effect (meaning this test can be used) for the duration of the COVID-19 declaration under Section 564(b)(1) of the Act, 21 U.S.C. section 360bbb-3(b)(1), unless the authorization is terminated or revoked sooner. Performed at [facility], [address] [place], [place] [address]          Radiology Studies: No results found.      Scheduled Meds: . enoxaparin (LOVENOX) injection  40 mg Subcutaneous QHS  . insulin aspart  0-5 Units Subcutaneous QHS  . insulin aspart  0-9 Units Subcutaneous TID WC  . mouth rinse  15 mL Mouth Rinse BID  . risperiDONE  1 mg Oral BID  . tamsulosin  0.4 mg Oral QPC supper   Continuous Infusions:   LOS: 3 days    Time spent: 25 minutes     Dorcas CarrowKuber Dianey Suchy, MD [facility] Pager [phone]  If 7PM-7AM, please contact night-coverage [email or web address] Password [email or web address] 12/17/2018, 12:16 PM

## 2018-12-18 ENCOUNTER — Inpatient Hospital Stay (HOSPITAL_COMMUNITY)
Admission: AD | Admit: 2018-12-18 | Discharge: 2018-12-23 | DRG: 885 | Disposition: A | Discharge status: Home / Self Care | Payer: Medicaid Other | Source: Intra-hospital | Attending: Psychiatry | Admitting: Psychiatry

## 2018-12-18 ENCOUNTER — Other Ambulatory Visit: Payer: Self-pay

## 2018-12-18 ENCOUNTER — Encounter (HOSPITAL_COMMUNITY): Payer: Self-pay

## 2018-12-18 DIAGNOSIS — G4700 Insomnia, unspecified: Secondary | ICD-10-CM | POA: Diagnosis present

## 2018-12-18 DIAGNOSIS — E109 Type 1 diabetes mellitus without complications: Secondary | ICD-10-CM | POA: Diagnosis present

## 2018-12-18 DIAGNOSIS — J45909 Unspecified asthma, uncomplicated: Secondary | ICD-10-CM | POA: Diagnosis present

## 2018-12-18 DIAGNOSIS — Z915 Personal history of self-harm: Secondary | ICD-10-CM

## 2018-12-18 DIAGNOSIS — I959 Hypotension, unspecified: Secondary | ICD-10-CM | POA: Diagnosis present

## 2018-12-18 DIAGNOSIS — F319 Bipolar disorder, unspecified: Principal | ICD-10-CM | POA: Diagnosis present

## 2018-12-18 DIAGNOSIS — Z885 Allergy status to narcotic agent status: Secondary | ICD-10-CM

## 2018-12-18 DIAGNOSIS — R45851 Suicidal ideations: Secondary | ICD-10-CM | POA: Diagnosis present

## 2018-12-18 DIAGNOSIS — Z794 Long term (current) use of insulin: Secondary | ICD-10-CM | POA: Diagnosis not present

## 2018-12-18 DIAGNOSIS — F419 Anxiety disorder, unspecified: Secondary | ICD-10-CM | POA: Diagnosis present

## 2018-12-18 DIAGNOSIS — Z888 Allergy status to other drugs, medicaments and biological substances status: Secondary | ICD-10-CM | POA: Diagnosis not present

## 2018-12-18 DIAGNOSIS — Z598 Other problems related to housing and economic circumstances: Secondary | ICD-10-CM

## 2018-12-18 DIAGNOSIS — Z79899 Other long term (current) drug therapy: Secondary | ICD-10-CM

## 2018-12-18 DIAGNOSIS — Z833 Family history of diabetes mellitus: Secondary | ICD-10-CM | POA: Diagnosis not present

## 2018-12-18 DIAGNOSIS — F17210 Nicotine dependence, cigarettes, uncomplicated: Secondary | ICD-10-CM | POA: Diagnosis present

## 2018-12-18 DIAGNOSIS — Z818 Family history of other mental and behavioral disorders: Secondary | ICD-10-CM | POA: Diagnosis not present

## 2018-12-18 DIAGNOSIS — Z8249 Family history of ischemic heart disease and other diseases of the circulatory system: Secondary | ICD-10-CM

## 2018-12-18 DIAGNOSIS — T1491XA Suicide attempt, initial encounter: Secondary | ICD-10-CM | POA: Diagnosis not present

## 2018-12-18 DIAGNOSIS — F3130 Bipolar disorder, current episode depressed, mild or moderate severity, unspecified: Secondary | ICD-10-CM | POA: Diagnosis present

## 2018-12-18 LAB — GLUCOSE, CAPILLARY
Glucose-Capillary: 137 mg/dL — ABNORMAL HIGH (ref 70–99)
Glucose-Capillary: 135 mg/dL — ABNORMAL HIGH (ref 70–99)
Glucose-Capillary: 248 mg/dL — ABNORMAL HIGH (ref 70–99)
Glucose-Capillary: 206 mg/dL — ABNORMAL HIGH (ref 70–99)
Glucose-Capillary: 159 mg/dL — ABNORMAL HIGH (ref 70–99)

## 2018-12-18 MED ORDER — ALUM & MAG HYDROXIDE-SIMETH 200-200-20 MG/5ML PO SUSP: 30.0000 mL | ORAL | Status: DC | PRN

## 2018-12-18 MED ORDER — PRENATAL MULTIVITAMIN CH
1.0000 | ORAL_TABLET | Freq: Every day | ORAL | Status: DC
  Administered 2018-12-18 – 2018-12-23 (×6): 1 via ORAL
  Filled 2018-12-18 (×9): qty 1

## 2018-12-18 MED ORDER — METFORMIN HCL 500 MG PO TABS
500.0000 mg | ORAL_TABLET | Freq: Two times a day (BID) | ORAL | Status: DC
  Administered 2018-12-18 – 2018-12-23 (×10): 500 mg via ORAL
  Filled 2018-12-18 (×14): qty 1

## 2018-12-18 MED ORDER — ACETAMINOPHEN 325 MG PO TABS
650.0000 mg | ORAL_TABLET | Freq: Four times a day (QID) | ORAL | Status: DC | PRN
  Administered 2018-12-19: 650 mg via ORAL
  Filled 2018-12-18: qty 2

## 2018-12-18 MED ORDER — FLUOXETINE HCL 20 MG PO CAPS
20.0000 mg | ORAL_CAPSULE | Freq: Every day | ORAL | Status: DC
  Filled 2018-12-18 (×4): qty 1

## 2018-12-18 MED ORDER — COMPLETENATE 29-1 MG PO CHEW
1.0000 | CHEWABLE_TABLET | Freq: Every day | ORAL | Status: DC
  Filled 2018-12-18: qty 1

## 2018-12-18 MED ORDER — ALBUTEROL SULFATE HFA 108 (90 BASE) MCG/ACT IN AERS: 2.0000 | INHALATION_SPRAY | RESPIRATORY_TRACT | Status: DC | PRN

## 2018-12-18 MED ORDER — INSULIN ASPART 100 UNIT/ML ~~LOC~~ SOLN
0.0000 [IU] | Freq: Three times a day (TID) | SUBCUTANEOUS | Status: DC
  Administered 2018-12-18: 18:00:00 5 [IU] via SUBCUTANEOUS
  Administered 2018-12-19: 2 [IU] via SUBCUTANEOUS
  Administered 2018-12-20: 3 [IU] via SUBCUTANEOUS
  Administered 2018-12-22 (×2): 2 [IU] via SUBCUTANEOUS

## 2018-12-18 NOTE — [facility] Suicide Risk Assessment (Signed)
[facility] Admission Suicide Risk Assessment   Nursing information obtained from:  Patient Demographic factors:  Low socioeconomic status Current Mental Status:  Suicidal ideation indicated by patient Loss Factors:  Financial problems / change in socioeconomic status, Decline in physical health Historical Factors:  Impulsivity Risk Reduction Factors:  Positive social support  Total Time spent with patient: 45 minutes Principal Problem: Status post overdose Diagnosis:  Active Problems:   Bipolar 1 disorder (HCC)  Subjective Data-known bipolar condition/intentional overdose Continued Clinical Symptoms:    The "Alcohol Use Disorders Identification Test", Guidelines for Use in Primary Care, Second Edition.  World Pharmacologist [facility]). Score between 0-7:  no or low risk or alcohol related problems. Score between 8-15:  moderate risk of alcohol related problems. Score between 16-19:  high risk of alcohol related problems. Score 20 or above:  warrants further diagnostic evaluation for alcohol dependence and treatment.   CLINICAL FACTORS:   Bipolar Disorder:   Depressive phase  Musculoskeletal: Strength & Muscle Tone: flaccid Gait & Station: unsteady Patient leans: N/A  Psychiatric Specialty Exam: Physical Exam sluggish and hypotensive  ROS endocrine positive for diabetes neurological recent overdose no head trauma to speak of by report/cardiovascular history of hypertension now hypotensive  Blood pressure (!) 82/46, pulse (!) 101, temperature 98.5 F (36.9 C), resp. rate 16, height 5\' 4"  (1.626 m), weight 69.4 kg, SpO2 97 %.Body mass index is 26.26 kg/m.  General Appearance: Casual  Eye Contact:  None  Speech:  Slurred  Volume:  Decreased  Mood:  Depressed  Affect:  Blunt  Thought Process:  Descriptions of Associations: Circumstantial  Orientation:  Other:  Person year and general situation that she is hospitalized but not exact location  Thought Content:  Illogical and  Disorganized to some extent  Suicidal Thoughts:  No  Homicidal Thoughts:  No  Memory:  Immediate;   Poor  Judgement:  Impaired  Insight: limited at present  Psychomotor Activity:  Decreased  Concentration:  Concentration: Poor  Recall:  Poor  Fund of Knowledge:  Poor  Language:  Poor  Akathisia:  Negative  Handed:  Right  AIMS (if indicated):     Assets:  Physical Health Resilience Social Support  ADL's:  Impaired  Cognition:  Impaired,  Severe  Sleep:         COGNITIVE FEATURES THAT CONTRIBUTE TO RISK:  Loss of executive function    SUICIDE RISK:   Moderate:  Frequent suicidal ideation with limited intensity, and duration, some specificity in terms of plans, no associated intent, good self-control, limited dysphoria/symptomatology, some risk factors present, and identifiable protective factors, including available and accessible social support.  PLAN OF CARE: see eval/orders  I certify that inpatient services furnished can reasonably be expected to improve the patient's condition.   Johnn Hai, MD 12/18/2018, 2:38 PM

## 2018-12-18 NOTE — Discharge Summary (Signed)
Physician Discharge Summary  [name] [name] TJQ:[id number] DOB: 07-23-1970 DOA: 12/13/2018  PCP: System, Pcp Not In  Admit date: 12/13/2018 Discharge date: 12/18/2018  Admitted From: home Disposition:  Inpatient psych  Recommendations for Outpatient Follow-up:  1. Follow up with Psych  Home Health:no  Equipment/Devices: none   Discharge Condition: Stable CODE STATUS: FULL Diet recommendation: low carb diet  Brief/Interim Summary: 48 y.o.femalewith medical history significant ofbipolar disorder, essential hypertension, type 2 diabetes mellitus who presents with intentional overdose and suicidal ideation. Patient reports that she took #5010 mg lisinopril tablets and#5025 mg Benadryl tablets as well as benzodiazepine an hour before calling EMS. She reports that "I have not been right on my medications" and "I want to die, and do not feel like living". She reports that she is going to be evicted from her trailer and that her son just got married and now has very little time to help her. She does report that she has tried to cut her wrists over the past few days as well. Patient does not report any other concerns/complaints at this time other than her progressive depression and continued suicidal thoughts.  Patient evaluated by psychiatry, recommended for inpatient psych placement At this time patient is medically stable for transfer to inpatient psychiatry.  Discharge Diagnoses:   Intentional drug overdose, suicidal ideation:-Patient ingested multiple tablets of lisinopril, Benadryl, benzodiazepine.Poison control contacted at time of admission.Suicide precaution, sitter at bedside.Psych consulted, recommended for inpatient psych placement. Patient is hemodynamically stable, blood pressure controlled, she is alert awake oriented. She is in agreement with transfer to inpatient psych for continued suicidal ideation.  Bipolar disorder: Continue Risperdal as per psych.  Essential  hypertension: Blood pressure controlled we have discontinued her lisinopril. Can resume when blood pressures more than 160.  Tobacco abuse:cessation counseling, nicotine patch  Diabetes mellitus type 2:Hemoglobin A1c 8.5.  She was prescribed 15 U Lantus daily and 5 units 3 times daily with meals ( looking at her home meds)  however not taking any insulin.  We will continue to hold home dose, continue on sliding scale insulin  With Novolog 0-9 units 3 times daily AC meals. Can resume home insulin once PO better and if sugar uncontrolled. Now sugar stable.  Acute urinary retention: Due to anticholinergic effects of Benadryl.  Improved.   Discharge Instructions  Discharge Instructions    Diet Carb Modified   Complete by: As directed    Discharge instructions   Complete by: As directed    Please call call MD or return to ER for similar or worsening recurring problem that brought you to hospital or if any fever,nausea/vomiting,abdominal pain, uncontrolled pain, chest pain,  shortness of breath or any other alarming symptoms.  Please follow-up your doctor as instructed in a week time and call the office for appointment.  Please avoid alcohol, smoking, or any other illicit substance and maintain healthy habits including taking your regular medications as prescribed.  You were cared for by a hospitalist during your hospital stay. If you have any questions about your discharge medications or the care you received while you were in the hospital after you are discharged, you can call the unit and ask to speak with the hospitalist on call if the hospitalist that took care of you is not available.  Once you are discharged, your primary care physician will handle any further medical issues. Please note that NO REFILLS for any discharge medications will be authorized once you are discharged, as it is imperative that you  return to your primary care physician (or establish a relationship with a primary  care physician if you do not have one) for your aftercare needs so that they can reassess your need for medications and monitor your lab values   Increase activity slowly   Complete by: As directed      Allergies as of 12/18/2018      Reactions   Tramadol Nausea Only   "feeling like I'm on fire"   Other Other (See Comments)   Pt reports being allergic to nicotine patches only.  She can use the gum   Nicotine Rash   Patient states she is allergic to the Nicotine patch and they cause her to break out in a rash.  She states she can smoke cigarettes.      Medication List    STOP taking these medications   benztropine 0.5 MG tablet Commonly known as: COGENTIN   carbamazepine 200 MG tablet Commonly known as: TEGRETOL   ibuprofen 200 MG tablet Commonly known as: ADVIL   insulin aspart 100 UNIT/ML FlexPen Commonly known as: NovoLOG FlexPen Replaced by: insulin aspart 100 UNIT/ML injection   Insulin Detemir 100 UNIT/ML Pen Commonly known as: Levemir FlexTouch   lisinopril 10 MG tablet Commonly known as: ZESTRIL   promethazine 25 MG tablet Commonly known as: PHENERGAN     TAKE these medications   albuterol 108 (90 Base) MCG/ACT inhaler Commonly known as: VENTOLIN HFA Inhale 2 puffs into the lungs every 6 (six) hours as needed for wheezing or shortness of breath.   blood glucose meter kit and supplies Dispense based on patient and insurance preference. Use up to four times daily as directed. (FOR ICD-10 E10.9, E11.9).   hydrOXYzine 25 MG tablet Commonly known as: ATARAX/VISTARIL Take 1 tablet (25 mg total) by mouth 3 (three) times daily as needed for anxiety.   insulin aspart 100 UNIT/ML injection Commonly known as: novoLOG Inject 0-9 Units into the skin 3 (three) times daily with meals. Replaces: insulin aspart 100 UNIT/ML FlexPen   metFORMIN 1000 MG tablet Commonly known as: GLUCOPHAGE Take 0.5 tablets (500 mg total) by mouth 2 (two) times daily.   Pen Needles  3/16" 31G X 5 MM Misc 15 Units by Does not apply route daily.   risperiDONE 1 MG tablet Commonly known as: RISPERDAL Take 1 tablet (1 mg total) by mouth 2 (two) times daily. What changed:   medication strength  how much to take  how to take this  when to take this  additional instructions   tamsulosin 0.4 MG Caps capsule Commonly known as: FLOMAX Take 1 capsule (0.4 mg total) by mouth at bedtime for 7 days.   traZODone 50 MG tablet Commonly known as: DESYREL Take 1 tablet (50 mg total) by mouth at bedtime as needed for sleep. What changed:   medication strength  how much to take  when to take this  reasons to take this       Allergies  Allergen Reactions  . Tramadol Nausea Only    "feeling like I'm on fire"  . Other Other (See Comments)    Pt reports being allergic to nicotine patches only.  She can use the gum  . Nicotine Rash    Patient states she is allergic to the Nicotine patch and they cause her to break out in a rash.  She states she can smoke cigarettes.   Subjective: Seen and examined on the day of discharge.  No overnight events.  He still  feels suicidal.  Was able to pee without concern.  Discharge Exam: Vitals:   12/17/18 2138 12/18/18 0627  BP: 103/67 (!) 90/53  Pulse: 75 82  Resp: 15 16  Temp: 98.3 F (36.8 C) 98.5 F (36.9 C)  SpO2: 91% 92%   Vitals:   12/17/18 0704 12/17/18 1609 12/17/18 2138 12/18/18 0627  BP: 105/71 112/71 103/67 (!) 90/53  Pulse: 83 84 75 82  Resp: '16  15 16  '$ Temp: 97.9 F (36.6 C) 98.3 F (36.8 C) 98.3 F (36.8 C) 98.5 F (36.9 C)  TempSrc: Oral Oral Oral Oral  SpO2: 95% 94% 91% 92%  Weight:      Height:        General: Pt is alert, awake, not in acute distress Cardiovascular: RRR, S1/S2 +, no rubs, no gallops Respiratory: CTA bilaterally, no wheezing, no rhonchi Abdominal: Soft, NT, ND, bowel sounds + Extremities: no edema, no cyanosis   The results of significant diagnostics from this  hospitalization (including imaging, microbiology, ancillary and laboratory) are listed below for reference.     Microbiology: Recent Results (from the past 240 hour(s))  SARS Coronavirus 2 (CEPHEID - Performed in [place] hospital lab), Hosp Order     Status: None   Collection Time: 12/13/18  6:29 PM   Specimen: Nasopharyngeal Swab  Result Value Ref Range Status   SARS Coronavirus 2 NEGATIVE NEGATIVE Final    Comment: (NOTE) If result is NEGATIVE SARS-CoV-2 target nucleic acids are NOT DETECTED. The SARS-CoV-2 RNA is generally detectable in upper and lower  respiratory specimens during the acute phase of infection. The lowest  concentration of SARS-CoV-2 viral copies this assay can detect is 250  copies / mL. A negative result does not preclude SARS-CoV-2 infection  and should not be used as the sole basis for treatment or other  patient management decisions.  A negative result may occur with  improper specimen collection / handling, submission of specimen other  than nasopharyngeal swab, presence of viral mutation(s) within the  areas targeted by this assay, and inadequate number of viral copies  (<250 copies / mL). A negative result must be combined with clinical  observations, patient history, and epidemiological information. If result is POSITIVE SARS-CoV-2 target nucleic acids are DETECTED. The SARS-CoV-2 RNA is generally detectable in upper and lower  respiratory specimens dur ing the acute phase of infection.  Positive  results are indicative of active infection with SARS-CoV-2.  Clinical  correlation with patient history and other diagnostic information is  necessary to determine patient infection status.  Positive results do  not rule out bacterial infection or co-infection with other viruses. If result is PRESUMPTIVE POSTIVE SARS-CoV-2 nucleic acids MAY BE PRESENT.   A presumptive positive result was obtained on the submitted specimen  and confirmed on repeat testing.   While 2019 novel coronavirus  (SARS-CoV-2) nucleic acids may be present in the submitted sample  additional confirmatory testing may be necessary for epidemiological  and / or clinical management purposes  to differentiate between  SARS-CoV-2 and other Sarbecovirus currently known to infect humans.  If clinically indicated additional testing with an alternate test  methodology [phone]) is advised. The SARS-CoV-2 RNA is generally  detectable in upper and lower respiratory sp ecimens during the acute  phase of infection. The expected result is Negative. Fact Sheet for Patients:  StrictlyIdeas.no Fact Sheet for Healthcare Providers: BankingDealers.co.za This test is not yet approved or cleared by the [place] FDA and has been authorized for  detection and/or diagnosis of SARS-CoV-2 by FDA under an Emergency Use Authorization (EUA).  This EUA will remain in effect (meaning this test can be used) for the duration of the COVID-19 declaration under Section 564(b)(1) of the Act, 21 U.S.C. section 360bbb-3(b)(1), unless the authorization is terminated or revoked sooner. Performed at [facility], [place] [address], [place],  [address]      Labs: BNP (last 3 results) No results for input(s): BNP in the last 8760 hours. Basic Metabolic Panel: Recent Labs  Lab 12/13/18 1450 12/13/18 1506 12/14/18 0622 12/15/18 0529 12/16/18 0627  NA 139  --  141 140 140  K 3.8  --  3.5 3.5 4.0  CL 106  --  110 109 110  CO2 20*  --  21* 21* 22  GLUCOSE 203*  --  91 92 89  BUN 15  --  '14 13 15  '$ CREATININE 1.11*  --  1.02* 0.81 0.85  CALCIUM 8.7*  --  8.8* 8.6* 8.4*  MG  --  1.7  --   --   --    Liver Function Tests: Recent Labs  Lab 12/13/18 1450 12/14/18 0622  AST 11* 11*  ALT 12 13  ALKPHOS 70 59  BILITOT 0.4 0.5  PROT 6.8 6.2*  ALBUMIN 4.2 3.8   No results for input(s): LIPASE, AMYLASE in the last 168  hours. Recent Labs  Lab 12/13/18 1506  AMMONIA <9*   CBC: Recent Labs  Lab 12/13/18 1450 12/14/18 0622  WBC 9.7 7.1  HGB 13.6 12.5  HCT 42.5 40.2  MCV 91.2 92.2  PLT 181 184   Cardiac Enzymes: No results for input(s): CKTOTAL, CKMB, CKMBINDEX, TROPONINI in the last 168 hours. BNP: Invalid input(s): POCBNP CBG: Recent Labs  Lab 12/17/18 1154 12/17/18 1612 12/17/18 2140 12/18/18 0730 12/18/18 1115  GLUCAP 138* 120* 157* 137* 135*   D-Dimer No results for input(s): DDIMER in the last 72 hours. Hgb A1c No results for input(s): HGBA1C in the last 72 hours. Lipid Profile No results for input(s): CHOL, HDL, LDLCALC, TRIG, CHOLHDL, LDLDIRECT in the last 72 hours. Thyroid function studies No results for input(s): TSH, T4TOTAL, T3FREE, THYROIDAB in the last 72 hours.  Invalid input(s): FREET3 Anemia work up No results for input(s): VITAMINB12, FOLATE, FERRITIN, TIBC, IRON, RETICCTPCT in the last 72 hours. Urinalysis    Component Value Date/Time   COLORURINE RED (A) 01/03/2018 1720   APPEARANCEUR HAZY (A) 01/03/2018 1720   LABSPEC 1.028 01/03/2018 1720   PHURINE 6.0 01/03/2018 1720   GLUCOSEU >=500 (A) 01/03/2018 1720   HGBUR LARGE (A) 01/03/2018 1720   HGBUR trace-intact 11/25/2008 1530   BILIRUBINUR NEGATIVE 01/03/2018 1720   KETONESUR 20 (A) 01/03/2018 1720   PROTEINUR 30 (A) 01/03/2018 1720   UROBILINOGEN 0.2 05/12/2014 2315   NITRITE NEGATIVE 01/03/2018 1720   LEUKOCYTESUR MODERATE (A) 01/03/2018 1720   Sepsis Labs Invalid input(s): PROCALCITONIN,  WBC,  LACTICIDVEN Microbiology Recent Results (from the past 240 hour(s))  SARS Coronavirus 2 (CEPHEID - Performed in [place] hospital lab), Hosp Order     Status: None   Collection Time: 12/13/18  6:29 PM   Specimen: Nasopharyngeal Swab  Result Value Ref Range Status   SARS Coronavirus 2 NEGATIVE NEGATIVE Final    Comment: (NOTE) If result is NEGATIVE SARS-CoV-2 target nucleic acids are NOT  DETECTED. The SARS-CoV-2 RNA is generally detectable in upper and lower  respiratory specimens during the acute phase of infection. The lowest  concentration  of SARS-CoV-2 viral copies this assay can detect is 250  copies / mL. A negative result does not preclude SARS-CoV-2 infection  and should not be used as the sole basis for treatment or other  patient management decisions.  A negative result may occur with  improper specimen collection / handling, submission of specimen other  than nasopharyngeal swab, presence of viral mutation(s) within the  areas targeted by this assay, and inadequate number of viral copies  (<250 copies / mL). A negative result must be combined with clinical  observations, patient history, and epidemiological information. If result is POSITIVE SARS-CoV-2 target nucleic acids are DETECTED. The SARS-CoV-2 RNA is generally detectable in upper and lower  respiratory specimens dur ing the acute phase of infection.  Positive  results are indicative of active infection with SARS-CoV-2.  Clinical  correlation with patient history and other diagnostic information is  necessary to determine patient infection status.  Positive results do  not rule out bacterial infection or co-infection with other viruses. If result is PRESUMPTIVE POSTIVE SARS-CoV-2 nucleic acids MAY BE PRESENT.   A presumptive positive result was obtained on the submitted specimen  and confirmed on repeat testing.  While 2019 novel coronavirus  (SARS-CoV-2) nucleic acids may be present in the submitted sample  additional confirmatory testing may be necessary for epidemiological  and / or clinical management purposes  to differentiate between  SARS-CoV-2 and other Sarbecovirus currently known to infect humans.  If clinically indicated additional testing with an alternate test  methodology [phone]) is advised. The SARS-CoV-2 RNA is generally  detectable in upper and lower respiratory sp ecimens during  the acute  phase of infection. The expected result is Negative. Fact Sheet for Patients:  StrictlyIdeas.no Fact Sheet for Healthcare Providers: BankingDealers.co.za This test is not yet approved or cleared by the [place] FDA and has been authorized for detection and/or diagnosis of SARS-CoV-2 by FDA under an Emergency Use Authorization (EUA).  This EUA will remain in effect (meaning this test can be used) for the duration of the COVID-19 declaration under Section 564(b)(1) of the Act, 21 U.S.C. section 360bbb-3(b)(1), unless the authorization is terminated or revoked sooner. Performed at [facility], [place] [address] [place], [place] [address]      Time coordinating discharge: 25 minutes  SIGNED:   Barb Merino, MD  [facility] 12/18/2018, 11:50 AM

## 2018-12-18 NOTE — Progress Notes (Signed)
Admission Note: Patient is a 48 year old female admitted to the unit for intentional drug overdose and suicidal ideation.  Patient presents with flat affect and depressed mood.  Patient is alert and oriented to name and place.  Appears drowsy and sedated during assessment.  Admission plan of care reviewed and consent signed.  Skin assessment and personal belongings completed.  Skin is dry and intact.  No contraband found.  Patient report feeling dizzy and about to pass out.  Patient transferred to the unit on wheel chair.  Needed assistance to transfer from chair to bed.  Routine safety checks initiated.  Patient is safe on the unit.

## 2018-12-18 NOTE — Progress Notes (Signed)
Report called to Benjamine Mola, Therapist, sports at [facility]

## 2018-12-18 NOTE — H&P (Signed)
Psychiatric Admission Assessment Adult  Patient Identification: [name] [name] MRN:  [id number] Date of Evaluation:  12/18/2018 Chief Complaint:  Bipolar Disorder Principal Diagnosis: Status post overdose/exacerbation and underlying affective disorder Diagnosis:  Active Problems:   Bipolar 1 disorder ([place])  History of Present Illness:   [name] [name] is well-known to the service she is 48 years of age and she was last admitted here in January, she has a history of a bipolar type condition involving psychosis and when she is ill often has disorganized thought and behavior, she had been stabilized on Risperdal and Tegretol during her last hospitalization.  Comorbidities include type I diabetes  She required psychiatric transfer after an overdose, she presented initially on 7/5 after an intentional overdose on lisinopril and Benadryl about an hour before calling EMS.  She acknowledges that she wanted to die and "did not feel like living" she reported she is going to be evicted from her trailer, her son is spending less time with her and she had attempted to cut her wrists over the last few days prior to this admission as well.  When I interviewed the patient she is in the rather confusional state she knows the year is 2020, she thinks it is Saturday, she is unsure of the time of day, of course she knows her name she knows she is at [facility] but cannot tell me what ward.  She states "I do not know" to most of the questions.  She denies auditory or visual hallucinations.  Denies current thoughts of harming self.  During her last admission it was more of a manic and disorganized state that required stabilization but there were no depressive symptoms or suicidal thoughts plans or intent during her last hospital stay  Associated Signs/Symptoms: Depression Symptoms:  psychomotor retardation, (Hypo) Manic Symptoms:  Impulsivity, Anxiety Symptoms:  n/a Psychotic Symptoms:  n/a PTSD  Symptoms: NA Total Time spent with patient: 45 min  Past Psychiatric History: exstensive  Is the patient at risk to self? Yes.    Has the patient been a risk to self in the past 6 months? No.  Has the patient been a risk to self within the distant past? Yes.    Is the patient a risk to others? No.  Has the patient been a risk to others in the past 6 months? No.  Has the patient been a risk to others within the distant past? No.   Prior Inpatient Therapy:   Prior Outpatient Therapy:    Alcohol Screening:   Substance Abuse History in the last 12 months:  No. Consequences of Substance Abuse: NA Previous Psychotropic Medications: Yes  Psychological Evaluations: No  Past Medical History:  Past Medical History:  Diagnosis Date  . Anxiety disorder   . Asthma   . Bipolar affective disorder ([place])   . Depression   . Diabetes mellitus   . Migraine   . Schizophrenia [facility])     Past Surgical History:  Procedure Laterality Date  . KNEE ARTHROSCOPY     x 2  . KNEE SURGERY Left    Incision made and knee cleaned out  . TUBAL LIGATION     Family History:  Family History  Problem Relation Age of Onset  . Bipolar disorder Mother   . Hypertension Father   . Diabetes Father   Tobacco Screening:   Social History:  Social History   Substance and Sexual Activity  Alcohol Use Not Currently   Comment: occasionally  Social History   Substance and Sexual Activity  Drug Use No    Additional Social History:                           Allergies:   Allergies  Allergen Reactions  . Tramadol Nausea Only    "feeling like I'm on fire"  . Other Other (See Comments)    Pt reports being allergic to nicotine patches only.  She can use the gum  . Nicotine Rash    Patient states she is allergic to the Nicotine patch and they cause her to break out in a rash.  She states she can smoke cigarettes.   Lab Results:  Results for orders placed or performed during the hospital  encounter of 12/13/18 (from the past 48 hour(s))  Glucose, capillary     Status: None   Collection Time: 12/16/18  4:24 PM  Result Value Ref Range   Glucose-Capillary 92 70 - 99 mg/dL  Glucose, capillary     Status: Abnormal   Collection Time: 12/16/18  9:52 PM  Result Value Ref Range   Glucose-Capillary 181 (H) 70 - 99 mg/dL  Glucose, capillary     Status: Abnormal   Collection Time: 12/17/18  6:25 AM  Result Value Ref Range   Glucose-Capillary 110 (H) 70 - 99 mg/dL  Glucose, capillary     Status: Abnormal   Collection Time: 12/17/18  8:33 AM  Result Value Ref Range   Glucose-Capillary 123 (H) 70 - 99 mg/dL   Comment 1 Notify RN    Comment 2 Document in Chart   Glucose, capillary     Status: Abnormal   Collection Time: 12/17/18 11:54 AM  Result Value Ref Range   Glucose-Capillary 138 (H) 70 - 99 mg/dL   Comment 1 Notify RN    Comment 2 Document in Chart   Glucose, capillary     Status: Abnormal   Collection Time: 12/17/18  4:12 PM  Result Value Ref Range   Glucose-Capillary 120 (H) 70 - 99 mg/dL   Comment 1 Notify RN    Comment 2 Document in Chart   Glucose, capillary     Status: Abnormal   Collection Time: 12/17/18  9:40 PM  Result Value Ref Range   Glucose-Capillary 157 (H) 70 - 99 mg/dL   Comment 1 Notify RN    Comment 2 Document in Chart   Glucose, capillary     Status: Abnormal   Collection Time: 12/18/18  7:30 AM  Result Value Ref Range   Glucose-Capillary 137 (H) 70 - 99 mg/dL   Comment 1 Notify RN    Comment 2 Document in Chart   Glucose, capillary     Status: Abnormal   Collection Time: 12/18/18 11:15 AM  Result Value Ref Range   Glucose-Capillary 135 (H) 70 - 99 mg/dL   Comment 1 Notify RN    Comment 2 Document in Chart     Blood Alcohol level:  Lab Results  Component Value Date   ETH <10 12/13/2018   ETH <10 73/53/2992    Metabolic Disorder Labs:  Lab Results  Component Value Date   HGBA1C 8.5 (H) 12/14/2018   MPG 197.25 12/14/2018   MPG  159.94 06/10/2018   Lab Results  Component Value Date   PROLACTIN 10.6 08/31/2015   Lab Results  Component Value Date   CHOL 149 06/10/2018   TRIG 125 06/10/2018   HDL 51 06/10/2018  CHOLHDL 2.9 06/10/2018   VLDL 25 06/10/2018   LDLCALC 73 06/10/2018   LDLCALC 83 08/31/2015    Current Medications: Current Facility-Administered Medications  Medication Dose Route Frequency Provider Last Rate Last Dose  . acetaminophen (TYLENOL) tablet 650 mg  650 mg Oral Q6H PRN Mordecai Maes, NP      . alum & mag hydroxide-simeth (MAALOX/MYLANTA) 200-200-20 MG/5ML suspension 30 mL  30 mL Oral Q4H PRN Mordecai Maes, NP       PTA Medications: Medications Prior to Admission  Medication Sig Dispense Refill Last Dose  . albuterol (PROVENTIL HFA;VENTOLIN HFA) 108 (90 Base) MCG/ACT inhaler Inhale 2 puffs into the lungs every 6 (six) hours as needed for wheezing or shortness of breath. (Patient not taking: Reported on 12/13/2018) 1 Inhaler 0   . blood glucose meter kit and supplies Dispense based on patient and insurance preference. Use up to four times daily as directed. (FOR ICD-10 E10.9, E11.9). 1 each 0   . hydrOXYzine (ATARAX/VISTARIL) 25 MG tablet Take 1 tablet (25 mg total) by mouth 3 (three) times daily as needed for anxiety. 30 tablet 0   . insulin aspart (NOVOLOG) 100 UNIT/ML injection Inject 0-9 Units into the skin 3 (three) times daily with meals. 10 mL 0   . Insulin Pen Needle (PEN NEEDLES 3/16") 31G X 5 MM MISC 15 Units by Does not apply route daily. 100 each 0   . metFORMIN (GLUCOPHAGE) 1000 MG tablet Take 0.5 tablets (500 mg total) by mouth 2 (two) times daily. 30 tablet 0   . risperiDONE (RISPERDAL) 1 MG tablet Take 1 tablet (1 mg total) by mouth 2 (two) times daily.     . tamsulosin (FLOMAX) 0.4 MG CAPS capsule Take 1 capsule (0.4 mg total) by mouth at bedtime for 7 days. 30 capsule    . traZODone (DESYREL) 50 MG tablet Take 1 tablet (50 mg total) by mouth at bedtime as needed for  sleep.       Musculoskeletal: Strength & Muscle Tone: flaccid Gait & Station: unsteady Patient leans: N/A  Psychiatric Specialty Exam: Physical Exam sluggish and hypotensive  ROS endocrine positive for diabetes neurological recent overdose no head trauma to speak of by report/cardiovascular history of hypertension now hypotensive  Blood pressure (!) 82/46, pulse (!) 101, temperature 98.5 F (36.9 C), resp. rate 16, height '5\' 4"'$  (1.626 m), weight 69.4 kg, SpO2 97 %.Body mass index is 26.26 kg/m.  General Appearance: Casual  Eye Contact:  None  Speech:  Slurred  Volume:  Decreased  Mood:  Depressed  Affect:  Blunt  Thought Process:  Descriptions of Associations: Circumstantial  Orientation:  Other:  Person year and general situation that she is hospitalized but not exact location  Thought Content:  Illogical and Disorganized to some extent  Suicidal Thoughts:  No  Homicidal Thoughts:  No  Memory:  Immediate;   Poor  Judgement:  Impaired  Insight: limited at present  Psychomotor Activity:  Decreased  Concentration:  Concentration: Poor  Recall:  Poor  Fund of Knowledge:  Poor  Language:  Poor  Akathisia:  Negative  Handed:  Right  AIMS (if indicated):     Assets:  Physical Health Resilience Social Support  ADL's:  Impaired  Cognition:  Impaired,  Severe  Sleep:       Treatment Plan Summary: Daily contact with patient to assess and evaluate symptoms and progress in treatment and Medication management  Observation Level/Precautions:  15 minute checks  Laboratory:  UDS  Psychotherapy:  Reality-based and cognitive-based  Medications: Add cautiously  Consultations: Cleared medically  Discharge Concerns: Longer-term stability  Estimated LOS: 7-10  Other: Bipolar depressed without psychosis/medically compromised at present due to hypotension and hyperglycemia   Physician Treatment Plan for Primary Diagnosis: <principal problem not specified> Long Term Goal(s):  Improvement in symptoms so as ready for discharge  Short Term Goals: Ability to demonstrate self-control will improve, Ability to identify and develop effective coping behaviors will improve and Ability to maintain clinical measurements within normal limits will improve  Physician Treatment Plan for Secondary Diagnosis: Active Problems:   Bipolar 1 disorder ([place])  Long Term Goal(s): Improvement in symptoms so as ready for discharge  Short Term Goals: Ability to disclose and discuss suicidal ideas, Ability to demonstrate self-control will improve and Ability to identify and develop effective coping behaviors will improve  I certify that inpatient services furnished can reasonably be expected to improve the patient's condition.    Johnn Hai, MD 7/10/20202:31 PM

## 2018-12-18 NOTE — Tx Team (Signed)
Initial Treatment Plan 12/18/2018 2:40 PM [name] [name] BJS:[id number]    PATIENT STRESSORS: Financial difficulties Health problems Legal issue   PATIENT STRENGTHS: Capable of independent living Supportive family/friends   PATIENT IDENTIFIED PROBLEMS: Suicidal Ideation  Depression                   DISCHARGE CRITERIA:  Ability to meet basic life and health needs Motivation to continue treatment in a less acute level of care  PRELIMINARY DISCHARGE PLAN: Attend aftercare/continuing care group Outpatient therapy Placement in alternative living arrangements  PATIENT/FAMILY INVOLVEMENT: This treatment plan has been presented to and reviewed with the patient, [name] [name], and/or family member.  The patient and family have been given the opportunity to ask questions and make suggestions.  Vela Prose, RN 12/18/2018, 2:40 PM

## 2018-12-18 NOTE — TOC Transition Note (Signed)
Transition of Care [facility]) - CM/SW Discharge Note   Patient Details  Name: [name] [name] MRN: [id number] Date of Birth: 1970-08-25  [facility]) CM/SW Contact:  Servando Snare, LCSW Phone Number: 12/18/2018, 12:26 PM   Clinical Narrative:    Patient has bed at [facility] bed 1. RN report # [phone].   Final next level of care: Psychiatric Hospital Barriers to Discharge: No Barriers Identified   Patient Goals and CMS Choice Patient states their goals for this hospitalization and ongoing recovery are:: find housing   Choice offered to / list presented to : NA  Discharge Placement              Patient chooses bed at: (tranferring to [facility])        Discharge Plan and Services                DME Arranged: N/A DME Agency: NA       HH Arranged: NA HH Agency: NA        Social Determinants of Health (SDOH) Interventions     Readmission Risk Interventions Readmission Risk Prevention Plan 12/16/2018  Transportation Screening Complete  Home Care Screening Complete  Some recent data might be hidden

## 2018-12-19 LAB — GLUCOSE, CAPILLARY
Glucose-Capillary: 138 mg/dL — ABNORMAL HIGH (ref 70–99)
Glucose-Capillary: 116 mg/dL — ABNORMAL HIGH (ref 70–99)
Glucose-Capillary: 87 mg/dL (ref 70–99)
Glucose-Capillary: 255 mg/dL — ABNORMAL HIGH (ref 70–99)

## 2018-12-19 LAB — TSH: TSH: 0.797 u[IU]/mL (ref 0.350–4.500)

## 2018-12-19 MED ORDER — ENSURE ENLIVE PO LIQD
237.0000 mL | Freq: Two times a day (BID) | ORAL | Status: DC
  Administered 2018-12-19 – 2018-12-23 (×6): 237 mL via ORAL

## 2018-12-19 MED ORDER — RISPERIDONE 0.5 MG PO TABS
0.5000 mg | ORAL_TABLET | Freq: Two times a day (BID) | ORAL | Status: DC
  Administered 2018-12-19 – 2018-12-23 (×8): 0.5 mg via ORAL
  Filled 2018-12-19 (×13): qty 1

## 2018-12-19 MED ORDER — MIRTAZAPINE 15 MG PO TABS
15.0000 mg | ORAL_TABLET | Freq: Every day | ORAL | Status: DC
  Administered 2018-12-19 – 2018-12-22 (×3): 15 mg via ORAL
  Filled 2018-12-19 (×8): qty 1

## 2018-12-19 MED ORDER — SERTRALINE HCL 25 MG PO TABS
25.0000 mg | ORAL_TABLET | Freq: Every day | ORAL | Status: DC
  Administered 2018-12-19 – 2018-12-20 (×2): 25 mg via ORAL
  Filled 2018-12-19 (×5): qty 1

## 2018-12-19 NOTE — Progress Notes (Signed)
DAR Note. Pt has been in the bed since the beginning of the shift. Pt refused to wake up, did not have any medications this evening.   Maintained on routine safety checks.  Medications given as prescribed.  Support and encouragement offered as needed.  Will continue to monitor.

## 2018-12-19 NOTE — Plan of Care (Signed)
D: Pt alert and oriented on the unit. Pt denies SI/HI, A/VH. Pt was isolative to her room asleep in bed and did not attend groups. Pt's goal for today is to work on "Medicine." Pt is cooperative.  A: Education, support and encouragement provided, q15 minute safety checks remain in effect. Medications administered per MD orders.  R: No reactions/side effects to medicine noted. Pt denies any concerns at this time, and verbally contracts for safety. Pt ambulating on the unit with no issues. Pt remains safe on and off the unit.   Problem: Coping: Goal: Coping ability will improve Outcome: Progressing Goal: Will verbalize feelings Outcome: Progressing
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
## 2018-12-19 NOTE — Progress Notes (Signed)
Patient ID: [name] [name], female   DOB: 1970/06/23, 48 y.o.   MRN: [id number]   [place] NOVEL CORONAVIRUS (COVID-19) DAILY CHECK-OFF SYMPTOMS - answer yes or no to each - every day NO YES  Have you had a fever in the past 24 hours?  . Fever (Temp > 37.80C / 100F) X   Have you had any of these symptoms in the past 24 hours? . New Cough .  Sore Throat  .  Shortness of Breath .  Difficulty Breathing .  Unexplained Body Aches   X   Have you had any one of these symptoms in the past 24 hours not related to allergies?   . Runny Nose .  Nasal Congestion .  Sneezing   X   If you have had runny nose, nasal congestion, sneezing in the past 24 hours, has it worsened?  X   EXPOSURES - check yes or no X   Have you traveled outside the state in the past 14 days?  X   Have you been in contact with someone with a confirmed diagnosis of COVID-19 or PUI in the past 14 days without wearing appropriate PPE?  X   Have you been living in the same home as a person with confirmed diagnosis of COVID-19 or a PUI (household contact)?    X   Have you been diagnosed with COVID-19?    X              What to do next: Answered NO to all: Answered YES to anything:   Proceed with unit schedule Follow the [facility] Inpatient Flowsheet.

## 2018-12-19 NOTE — [facility] Group Notes (Addendum)
[facility] Group Notes: (Clinical Social Work)   12/19/2018      Type of Therapy:  Group Therapy   Participation Level:  Did Not Attend - was invited both individually by [facility] and by overhead announcement, chose not to attend.   Gordana Kewley Grossman-Orr, LCSW 12/19/2018, 1:06 PM     

## 2018-12-19 NOTE — Progress Notes (Signed)
The focus of this group is to help patients establish daily goals to achieve during treatment and discuss how the patient can incorporate goal setting into their daily lives to aide in recovery. 

## 2018-12-19 NOTE — Progress Notes (Addendum)
[facility] MD Progress Note  12/19/2018 3:48 PM [name] [name]  MRN:  [id number]   Subjective: [name] [name] stated " I do not want to live."  Evaluation: Patient observed resting in bed.  Assessment was completed about 15:45.  As patient has been sleeping for most of the day.  Patient presents flat, guarded, disheveled, blunted and depressed.  Did not attend breakfast or lunch as she reports decreased appetite.  Patient was initiated on Prozac however patient has declined taking Prozac she is requesting to start Zoloft.  Medications was adjusted.  Will initiate Remeron to aid with appetite.  Patient expressed passive suicidal ideations however denying intent and plan during this assessment.  Denies auditory visual hallucinations.  Patient was receptive to starting medication.  Patient encouraged to participate throughout the milieu.  Support encouragement reassurance was provided.   Principal Problem: <principal problem not specified> Diagnosis: Active Problems:   Bipolar 1 disorder (HCC)  Total Time spent with patient: 15 minutes  Past Psychiatric History:   Past Medical History:  Past Medical History:  Diagnosis Date  . Anxiety disorder   . Asthma   . Bipolar affective disorder ([place])   . Depression   . Diabetes mellitus   . Migraine   . Schizophrenia [facility])     Past Surgical History:  Procedure Laterality Date  . KNEE ARTHROSCOPY     x 2  . KNEE SURGERY Left    Incision made and knee cleaned out  . TUBAL LIGATION     Family History:  Family History  Problem Relation Age of Onset  . Bipolar disorder Mother   . Hypertension Father   . Diabetes Father    Family Psychiatric  History:  Social History:  Social History   Substance and Sexual Activity  Alcohol Use Not Currently   Comment: occasionally     Social History   Substance and Sexual Activity  Drug Use No    Social History   Socioeconomic History  . Marital status: Divorced    Spouse name: Not on file  . Number of  children: Not on file  . Years of education: Not on file  . Highest education [name]: Not on file  Occupational History  . Not on file  Social Needs  . Financial resource strain: Not on file  . Food insecurity    Worry: Not on file    Inability: Not on file  . Transportation needs    Medical: Not on file    Non-medical: Not on file  Tobacco Use  . Smoking status: Current Every Day Smoker    Packs/day: 1.00    Years: 29.00    Pack years: 29.00    Types: Cigarettes  . Smokeless tobacco: Never Used  Substance and Sexual Activity  . Alcohol use: Not Currently    Comment: occasionally  . Drug use: No  . Sexual activity: Yes    Birth control/protection: Surgical  Lifestyle  . Physical activity    Days per week: Not on file    Minutes per session: Not on file  . Stress: Not on file  Relationships  . Social Herbalist on phone: Not on file    Gets together: Not on file    Attends religious service: Not on file    Active member of club or organization: Not on file    Attends meetings of clubs or organizations: Not on file    Relationship status: Not on file  Other Topics Concern  .  Not on file  Social History Narrative   Patient lives in [place] with her 2 kids- 110 in 56 years old.   She has total 4 kids.   She is a single mom.         Additional Social History:                         Sleep: Negative  Appetite:  Poor  Current Medications: Current Facility-Administered Medications  Medication Dose Route Frequency Provider Last Rate Last Dose  . acetaminophen (TYLENOL) tablet 650 mg  650 mg Oral Q6H PRN Denzil Magnuson, NP      . albuterol (VENTOLIN HFA) 108 (90 Base) MCG/ACT inhaler 2 puff  2 puff Inhalation Q4H PRN Malvin Johns, MD      . alum & mag hydroxide-simeth (MAALOX/MYLANTA) 200-200-20 MG/5ML suspension 30 mL  30 mL Oral Q4H PRN Denzil Magnuson, NP      . insulin aspart (novoLOG) injection 0-15 Units  0-15 Units Subcutaneous TID WC  Malvin Johns, MD   2 Units at 12/19/18 [phone]  . metFORMIN (GLUCOPHAGE) tablet 500 mg  500 mg Oral BID WC Malvin Johns, MD   500 mg at 12/19/18 6553  . prenatal multivitamin tablet 1 tablet  1 tablet Oral Daily Malvin Johns, MD   1 tablet at 12/19/18 [phone]  . sertraline (ZOLOFT) tablet 25 mg  25 mg Oral Daily Oneta Rack, NP        Lab Results:  Results for orders placed or performed during the hospital encounter of 12/18/18 (from the past 48 hour(s))  Glucose, capillary     Status: Abnormal   Collection Time: 12/18/18  1:59 PM  Result Value Ref Range   Glucose-Capillary 248 (H) 70 - 99 mg/dL   Comment 1 Notify RN    Comment 2 Document in Chart   Glucose, capillary     Status: Abnormal   Collection Time: 12/18/18  4:55 PM  Result Value Ref Range   Glucose-Capillary 206 (H) 70 - 99 mg/dL   Comment 1 Notify RN    Comment 2 Document in Chart   Glucose, capillary     Status: Abnormal   Collection Time: 12/18/18  7:59 PM  Result Value Ref Range   Glucose-Capillary 159 (H) 70 - 99 mg/dL  Glucose, capillary     Status: Abnormal   Collection Time: 12/19/18  6:22 AM  Result Value Ref Range   Glucose-Capillary 138 (H) 70 - 99 mg/dL  Glucose, capillary     Status: Abnormal   Collection Time: 12/19/18 11:50 AM  Result Value Ref Range   Glucose-Capillary 116 (H) 70 - 99 mg/dL    Blood Alcohol [name]:  Lab Results  Component Value Date   ETH <10 12/13/2018   ETH <10 06/08/2018    Metabolic Disorder Labs: Lab Results  Component Value Date   HGBA1C 8.5 (H) 12/14/2018   MPG 197.25 12/14/2018   MPG 159.94 06/10/2018   Lab Results  Component Value Date   PROLACTIN 10.6 08/31/2015   Lab Results  Component Value Date   CHOL 149 06/10/2018   TRIG 125 06/10/2018   HDL 51 06/10/2018   CHOLHDL 2.9 06/10/2018   VLDL 25 06/10/2018   LDLCALC 73 06/10/2018   LDLCALC 83 08/31/2015    Physical Findings: AIMS:  , ,  ,  ,    CIWA:    COWS:     Musculoskeletal: Strength & Muscle  Tone: within normal  limits Gait & Station: normal Patient leans: N/A  Psychiatric Specialty Exam: Physical Exam  Vitals reviewed. Constitutional: She appears well-developed.    Review of Systems  Psychiatric/Behavioral: Positive for depression and suicidal ideas. The patient has insomnia.   All other systems reviewed and are negative.   Blood pressure 116/85, pulse 100, temperature 97.7 F (36.5 C), resp. rate 16, height 5\' 4"  (1.626 m), weight 69.4 kg, SpO2 96 %.Body mass index is 26.26 kg/m.  General Appearance: Guarded  Eye Contact:  Minimal  Speech:  Clear and Coherent  Volume:  Normal  Mood:  Anxious, Depressed and Dysphoric  Affect:  Depressed and Flat  Thought Process:  Coherent  Orientation:  Full (Time, Place, and Person)  Thought Content:  Logical  Suicidal Thoughts:  Yes.  with intent/plan  Homicidal Thoughts:  No  Memory:  Immediate;   Fair Recent;   Fair  Judgement:  Fair  Insight:  Fair  Psychomotor Activity:  Normal  Concentration:  Concentration: Poor  Recall:  Poor  Fund of Knowledge:  Fair  Language:  Fair  Akathisia:  No  Handed:  Right  AIMS (if indicated):     Assets:  Communication Skills Desire for Improvement Resilience Social Support  ADL's:  Intact  Cognition:  WNL  Sleep:  Number of Hours: 6     Treatment Plan Summary: Daily contact with patient to assess and evaluate symptoms and progress in treatment and Medication management  Continue with current treatment plan on 12/19/2018 as listed below except were noted  Bipolar depression  Discontinue Prozac 20 mg p.o. daily Initiated Zoloft 25 mg p.o. daily  (patfient was agreeable to start Zolfot)  Initiated Remeron 15 mg p.o. nightly restarted Risperdal 0.5mg  BID  Labs pending, EKG, CBC , Lipid and CMP  CSW to continue working on discharge disposition Patient encouraged to participate within the therapeutic milieu Oneta Rackanika N Lewis, NP 12/19/2018, 3:48 PM   Attest to NP Progress  Note

## 2018-12-20 LAB — GLUCOSE, CAPILLARY
Glucose-Capillary: 120 mg/dL — ABNORMAL HIGH (ref 70–99)
Glucose-Capillary: 169 mg/dL — ABNORMAL HIGH (ref 70–99)
Glucose-Capillary: 115 mg/dL — ABNORMAL HIGH (ref 70–99)
Glucose-Capillary: 104 mg/dL — ABNORMAL HIGH (ref 70–99)

## 2018-12-20 LAB — LIPID PANEL
Cholesterol: 196 mg/dL (ref 0–200)
HDL: 48 mg/dL (ref >40)
LDL Cholesterol: 128 mg/dL — ABNORMAL HIGH (ref 0–99)
Total CHOL/HDL Ratio: 4.1 RATIO
Triglycerides: 99 mg/dL (ref <150)
VLDL: 20 mg/dL (ref 0–40)

## 2018-12-20 LAB — HEMOGLOBIN A1C
Hgb A1c MFr Bld: 8.2 % — ABNORMAL HIGH (ref 4.8–5.6)
Mean Plasma Glucose: 188.64 mg/dL

## 2018-12-20 MED ORDER — SERTRALINE HCL 50 MG PO TABS
50.0000 mg | ORAL_TABLET | Freq: Every day | ORAL | Status: DC
  Administered 2018-12-21 – 2018-12-23 (×3): 50 mg via ORAL
  Filled 2018-12-20 (×5): qty 1

## 2018-12-20 NOTE — [facility] Group Notes (Signed)
[facility] LCSW Group Therapy Note  Date/Time:  12/20/2018  11:00AM-12:00PM  Type of Therapy and Topic:  Group Therapy:  Music and Mood  Participation Level:  None   Description of Group: In this process group, members listened to a variety of genres of music and identified that different types of music evoke different responses.  Patients were encouraged to identify music that was soothing for them and music that was energizing for them.  Patients discussed how this knowledge can help with wellness and recovery in various ways including managing depression and anxiety as well as encouraging healthy sleep habits.    Therapeutic Goals: 1. Patients will explore the impact of different varieties of music on mood 2. Patients will verbalize the thoughts they have when listening to different types of music 3. Patients will identify music that is soothing to them as well as music that is energizing to them 4. Patients will discuss how to use this knowledge to assist in maintaining wellness and recovery 5. Patients will explore the use of music as a coping skill  Summary of Patient Progress:  At the beginning of group, patient expressed that she felt "okay, I guess, but tired."  During the very first song, she left group and did not return.  Therapeutic Modalities: Solution Focused Brief Therapy Activity   Selmer Dominion, LCSW

## 2018-12-20 NOTE — Progress Notes (Addendum)
[facility]  12/20/2018 1:48 PM [name] [name]     Evaluation:  [name] [name] seen standing at the nurses station for medication administration.  Continues to endorse passive suicidal ideations.  Patient presents flat guarded and depressed.  She denies homicidal ideations.  Patient was initiated on Zoloft per her request she reports taking and tolerating medications well.  Initiated Remeron denies side effects.  Denies attending daily group sessions.  Patient continues to isolate to her room.  Patient was encouraged to hydrate and participate within the milieu.  Patient was agreeable to plan.  Staff to encourage daily participation.  Patient presented disheveled with decreased ADLs.  Support encouragement reassurance was provided.   Principal Problem: Bipolar 1 disorder (HCC) Diagnosis: Principal Problem:   Bipolar 1 disorder (HCC)  Total Time spent with patient: 15 minutes  Past Psychiatric History:   Past Medical History:  Past Medical History:  Diagnosis Date  . Anxiety disorder   . Asthma   . Bipolar affective disorder (HCC)   . Depression   . Diabetes mellitus   . Migraine   . Schizophrenia [facility](HCC)     Past Surgical History:  Procedure Laterality Date  . KNEE ARTHROSCOPY     x 2  . KNEE SURGERY Left    Incision made and knee cleaned out  . TUBAL LIGATION     Family History:  Family History  Problem Relation Age of Onset  . Bipolar disorder Mother   . Hypertension Father   . Diabetes Father    Family Psychiatric  History:  Social History:  Social History   Substance and Sexual Activity  Alcohol Use Not Currently   Comment: occasionally     Social History   Substance and Sexual Activity  Drug Use No    Social History   Socioeconomic History  . Marital status: Divorced    Spouse name: Not on file  . Number of children: Not on file  . Years of education: Not on file  . Highest education level: Not on file  Occupational History  . Not  on file  Social Needs  . Financial resource strain: Not on file  . Food insecurity    Worry: Not on file    Inability: Not on file  . Transportation needs    Medical: Not on file    Non-medical: Not on file  Tobacco Use  . Smoking status: Current Every Day Smoker    Packs/day: 1.00    Years: 29.00    Pack years: 29.00    Types: Cigarettes  . Smokeless tobacco: Never Used  Substance and Sexual Activity  . Alcohol use: Not Currently    Comment: occasionally  . Drug use: No  . Sexual activity: Yes    Birth control/protection: Surgical  Lifestyle  . Physical activity    Days per week: Not on file    Minutes per session: Not on file  . Stress: Not on file  Relationships  . Social Musicianconnections    Talks on phone: Not on file    Gets together: Not on file    Attends religious service: Not on file    Active member of club or organization: Not on file    Attends meetings of clubs or organizations: Not on file    Relationship status: Not on file  Other Topics Concern  . Not on file  Social History Narrative   Patient lives in [place] with her 2 kids- 14 in  49 years old.   She has total 4 kids.   She is a single mom.         Additional Social History:                         Sleep: Negative  Appetite:  Poor  Current Medications: Current Facility-Administered Medications  Medication Dose Route Frequency Provider Last Rate Last Dose  . acetaminophen (TYLENOL) tablet 650 mg  650 mg Oral Q6H PRN [name] Magnusonhomas, Lashunda, NP   650 mg at 12/19/18 2113  . albuterol (VENTOLIN HFA) 108 (90 Base) MCG/ACT inhaler 2 puff  2 puff Inhalation Q4H PRN Malvin JohnsFarah, Brian, MD      . alum & mag hydroxide-simeth (MAALOX/MYLANTA) 200-200-20 MG/5ML suspension 30 mL  30 mL Oral Q4H PRN [name] Magnusonhomas, Lashunda, NP      . feeding supplement (ENSURE ENLIVE) (ENSURE ENLIVE) liquid 237 mL  237 mL Oral BID BM Oneta RackLewis, Tanika N, NP   237 mL at 12/20/18 1013  . insulin aspart (novoLOG) injection 0-15 Units  0-15  Units Subcutaneous TID WC Malvin JohnsFarah, Brian, MD   3 Units at 12/20/18 1205  . metFORMIN (GLUCOPHAGE) tablet 500 mg  500 mg Oral BID WC Malvin JohnsFarah, Brian, MD   500 mg at 12/20/18 0844  . mirtazapine (REMERON) tablet 15 mg  15 mg Oral QHS Oneta RackLewis, Tanika N, NP   15 mg at 12/19/18 2112  . prenatal multivitamin tablet 1 tablet  1 tablet Oral Daily Malvin JohnsFarah, Brian, MD   1 tablet at 12/20/18 0845  . risperiDONE (RISPERDAL) tablet 0.5 mg  0.5 mg Oral BID Oneta RackLewis, Tanika N, NP   0.5 mg at 12/20/18 0844  . sertraline (ZOLOFT) tablet 25 mg  25 mg Oral Daily Oneta RackLewis, Tanika N, NP   25 mg at 12/20/18 0845    Lab Results:  Results for orders placed or performed during the hospital encounter of 12/18/18 (from the past 48 hour(s))  Glucose, capillary     Status: Abnormal   Collection Time: 12/18/18  1:59 PM  Result Value Ref Range   Glucose-Capillary 248 (H) 70 - 99 mg/dL   Comment 1 Notify RN    Comment 2 Document in Chart   Glucose, capillary     Status: Abnormal   Collection Time: 12/18/18  4:55 PM  Result Value Ref Range   Glucose-Capillary 206 (H) 70 - 99 mg/dL   Comment 1 Notify RN    Comment 2 Document in Chart   Glucose, capillary     Status: Abnormal   Collection Time: 12/18/18  7:59 PM  Result Value Ref Range   Glucose-Capillary 159 (H) 70 - 99 mg/dL  Glucose, capillary     Status: Abnormal   Collection Time: 12/19/18  6:22 AM  Result Value Ref Range   Glucose-Capillary 138 (H) 70 - 99 mg/dL  Glucose, capillary     Status: Abnormal   Collection Time: 12/19/18 11:50 AM  Result Value Ref Range   Glucose-Capillary 116 (H) 70 - 99 mg/dL  Glucose, capillary     Status: None   Collection Time: 12/19/18  4:32 PM  Result Value Ref Range   Glucose-Capillary 87 70 - 99 mg/dL  TSH     Status: None   Collection Time: 12/19/18  6:00 PM  Result Value Ref Range   TSH 0.797 0.350 - 4.500 uIU/mL    Comment: Performed by a 3rd Generation assay with a functional sensitivity of <=0.01 uIU/mL. Performed at [facility]  [place] [address], [place], [place] [address]   Glucose, capillary     Status: Abnormal   Collection Time: 12/19/18  8:13 PM  Result Value Ref Range   Glucose-Capillary 255 (H) 70 - 99 mg/dL  Glucose, capillary     Status: Abnormal   Collection Time: 12/20/18  6:05 AM  Result Value Ref Range   Glucose-Capillary 120 (H) 70 - 99 mg/dL  Hemoglobin A1c     Status: Abnormal   Collection Time: 12/20/18  6:45 AM  Result Value Ref Range   Hgb A1c MFr Bld 8.2 (H) 4.8 - 5.6 %    Comment: (NOTE) Pre diabetes:          5.7%-6.4% Diabetes:              >6.4% Glycemic control for   <7.0% adults with diabetes    Mean Plasma Glucose 188.64 mg/dL    Comment: Performed at [facility], [place] [address], [place], [place] [address]  Lipid panel     Status: Abnormal   Collection Time: 12/20/18  6:45 AM  Result Value Ref Range   Cholesterol 196 0 - 200 mg/dL   Triglycerides 99 <150 mg/dL   HDL 48 >40 mg/dL   Total CHOL/HDL Ratio 4.1 RATIO   VLDL 20 0 - 40 mg/dL   LDL Cholesterol 128 (H) 0 - 99 mg/dL    Comment:        Total Cholesterol/HDL:CHD Risk Coronary Heart Disease Risk Table                     Men   Women  1/2 Average Risk   3.4   3.3  Average Risk       5.0   4.4  2 X Average Risk   9.6   7.1  3 X Average Risk  23.4   11.0        Use the calculated Patient Ratio above and the CHD Risk Table to determine the patient's CHD Risk.        ATP III CLASSIFICATION (LDL):  <100     mg/dL   Optimal  100-129  mg/dL   Near or Above                    Optimal  130-159  mg/dL   Borderline  160-189  mg/dL   High  >190     mg/dL   Very High Performed at [facility] [address] [place], [place] [address]   Glucose, capillary     Status: Abnormal   Collection Time: 12/20/18 12:02 PM  Result Value Ref Range   Glucose-Capillary 169 (H) 70 - 99 mg/dL   Comment 1 Notify RN     Blood Alcohol level:  Lab Results  Component Value Date   ETH <10  12/13/2018   ETH <10 78/58/8502    Metabolic Disorder Labs: Lab Results  Component Value Date   HGBA1C 8.2 (H) 12/20/2018   MPG 188.64 12/20/2018   MPG 197.25 12/14/2018   Lab Results  Component Value Date   PROLACTIN 10.6 08/31/2015   Lab Results  Component Value Date   CHOL 196 12/20/2018   TRIG 99 12/20/2018   HDL 48 12/20/2018   CHOLHDL 4.1 12/20/2018   VLDL 20 12/20/2018   LDLCALC 128 (H) 12/20/2018   LDLCALC 73 06/10/2018    Physical Findings: AIMS:  , ,  ,  ,  CIWA:    COWS:     Musculoskeletal: Strength & Muscle Tone: within normal limits Gait & Station: normal Patient leans: N/A  Psychiatric Specialty Exam: Physical Exam  Vitals reviewed. Constitutional: She appears well-developed.  Psychiatric: She has a normal mood and affect. Her behavior is normal.    Review of Systems  Psychiatric/Behavioral: Positive for depression and suicidal ideas. The patient has insomnia.   All other systems reviewed and are negative.   Blood pressure 113/68, pulse (!) 53, temperature 98.6 F (37 C), temperature source Oral, resp. rate 16, height 5\' 4"  (1.626 m), weight 69.4 kg, SpO2 98 %.Body mass index is 26.26 kg/m.  General Appearance: Guarded  Eye Contact:  Minimal  Speech:  Clear and Coherent  Volume:  Normal  Mood:  Anxious, Depressed and Dysphoric  Affect:  Depressed and Flat  Thought Process:  Coherent  Orientation:  Full (Time, Place, and Person)  Thought Content:  Logical  Suicidal Thoughts:  Yes.  with intent/plan  Homicidal Thoughts:  No  Memory:  Immediate;   Fair Recent;   Fair  Judgement:  Fair  Insight:  Fair  Psychomotor Activity:  Normal  Concentration:  Concentration: Poor  Recall:  Poor  Fund of Knowledge:  Fair  Language:  Fair  Akathisia:  No  Handed:  Right  AIMS (if indicated):     Assets:  Communication Skills Desire for Improvement Resilience Social Support  ADL's:  Intact  Cognition:  WNL  Sleep:  Number of Hours: 6.75      Treatment Plan Summary: Daily contact with patient to assess and evaluate symptoms and progress in treatment and Medication management  Continue with current treatment plan on 12/20/2018 as listed below except were noted  Bipolar depression  Discontinue Prozac 20 mg p.o. daily Increased  Zoloft 25 mg to 50 mg  p.o. daily  (patfient was agreeable to start Zolfot)  Continue  Remeron 15 mg p.o. nightly Continue Risperdal 0.5mg  BID Continue Cogentin 0.5 mg BID   Lab reviwed: A1C 8.1 , EKG, CBC , Lipid, LDL 128  and CMP  CSW to continue working on discharge disposition Patient encouraged to participate within the therapeutic milieu   Oneta Rack, NP 12/20/2018, 1:48 PM  Attest to NP Progress Note

## 2018-12-20 NOTE — Progress Notes (Signed)
DAR NOTE: Patient presents with flat affect and depressed mood.  Denies auditory and visual hallucinations but report passive SI.  Reports energy level and concentration as low and poor.  Rates depression, hopelessness, and anxiety as high.  Maintained on routine safety checks.  Medications given as prescribed.  Support and encouragement offered as needed.  Patient is withdrawn and isolates to her room for majority of the shift.  Food and fluid intake encouraged due to poor appetite.  Patient is safe on the unit.

## 2018-12-20 NOTE — [facility] Suicide Risk Assessment (Signed)
[facility] INPATIENT:  Family/Significant Other Suicide Prevention Education  Suicide Prevention Education:  Education Completed; Ilissa Rosner, daughter-in-law [phone],  (name of family member/significant other) has been identified by the patient as the family member/significant other with whom the patient will be residing, and identified as the person(s) who will aid the patient in the event of a mental health crisis (suicidal ideations/suicide attempt).  With written consent from the patient, the family member/significant other has been provided the following suicide prevention education, prior to the and/or following the discharge of the patient.  Son and daughter-in-law are seeking new housing for patient because she is being evicted from her [address] 8 housing.  They have been in touch with [facility] and [facility] that both run group homes and independent living sites.  They are asking that [facility] fax clinical information to both locations.  Patient was agreeable to signing releases for this to be done.  The suicide prevention education provided includes the following:  Suicide risk factors  Suicide prevention and interventions  National Suicide Hotline telephone number  [facility] assessment telephone number  [facility] Emergency Assistance [place] and/or Residential Mobile Crisis Unit telephone number  Request made of family/significant other to:  Remove weapons (e.g., guns, rifles, knives), all items previously/currently identified as safety concern.    Remove drugs/medications (over-the-counter, prescriptions, illicit drugs), all items previously/currently identified as a safety concern.  The family member/significant other verbalizes understanding of the suicide prevention education information provided.  The family member/significant other agrees to remove the items of safety concern listed above.  Berlin Hun Grossman-Orr 12/20/2018, 9:11 AM

## 2018-12-20 NOTE — Progress Notes (Signed)
D: Pt denies SI/HI/AVH. Pt stayed in room, pt avoidant and guarded A: Pt was offered support and encouragement.Pt was encourage to attend groups. Q 15 minute checks were done for safety.  R:  safety maintained on unit.

## 2018-12-21 LAB — GLUCOSE, CAPILLARY
Glucose-Capillary: 131 mg/dL — ABNORMAL HIGH (ref 70–99)
Glucose-Capillary: 158 mg/dL — ABNORMAL HIGH (ref 70–99)
Glucose-Capillary: 92 mg/dL (ref 70–99)
Glucose-Capillary: 146 mg/dL — ABNORMAL HIGH (ref 70–99)

## 2018-12-21 NOTE — Progress Notes (Signed)
Patient ID: [name] [name], female   DOB: 1970/12/26, 48 y.o.   MRN: [id number]   CSW was contacted by pt's daughter in law about [name] [name] needing additional information (CCA, psych eval and proof that the pt is not having active SI).   CSW let social work Environmental consultant know and fax was sent over.

## 2018-12-21 NOTE — Progress Notes (Signed)
Recreation Therapy Notes  Date: 7.13.20 Time: 1013 Location: 500 [address] Dayroom  Group Topic: Coping Skills  Goal Area(s) Addresses:  Patient will identify positive coping skills. Patient will identify benefits of using coping skills post d/c.  Intervention:  White board, dry erase marker, worksheet  Activity:  Mind Map.  Patients and LRT filled out first 8 boxes together with anger, substance abuse, being sad, anxiety, verbal abuse, torture and finances.  Patients will then come up with at least three coping skills per situation.  LRT will then write the coping skills on the board so patience can fill in the blank spots on their worksheets.  Education: Coping Skills, Discharge Planning.   Education Outcome: Acknowledges understanding/In group clarification offered/Needs additional education.   Clinical Observations/Feedback:  Pt did not attend group.    Starling Christofferson, LRT/CTRS         Tiquan Bouch A 12/21/2018 11:09 AM 

## 2018-12-21 NOTE — Progress Notes (Signed)
[facility]  12/21/2018 10:47 AM [name] [name]  MRN:  [id number] Subjective:    Patient seen in her room she is alert she is oriented fully and cooperative she denies thoughts of harming herself she denies thoughts of harming others she can contract however she states she has "nowhere to go" she is passive and reports indicate she is somewhat depressed and flat in appearance and her suicidal thoughts when documented yesterday were passive but then she denied them later in the day. Principal Problem: Bipolar 1 disorder (HCC) Diagnosis: Principal Problem:   Bipolar 1 disorder (HCC)  Total Time spent with patient: 20 minutes  Past Psychiatric History: exstensive  Past Medical History:  Past Medical History:  Diagnosis Date  . Anxiety disorder   . Asthma   . Bipolar affective disorder (HCC)   . Depression   . Diabetes mellitus   . Migraine   . Schizophrenia [facility](HCC)     Past Surgical History:  Procedure Laterality Date  . KNEE ARTHROSCOPY     x 2  . KNEE SURGERY Left    Incision made and knee cleaned out  . TUBAL LIGATION     Family History:  Family History  Problem Relation Age of Onset  . Bipolar disorder Mother   . Hypertension Father   . Diabetes Father    Family Psychiatric  History: no new data Social History:  Social History   Substance and Sexual Activity  Alcohol Use Not Currently   Comment: occasionally     Social History   Substance and Sexual Activity  Drug Use No    Social History   Socioeconomic History  . Marital status: Divorced    Spouse name: Not on file  . Number of children: Not on file  . Years of education: Not on file  . Highest education level: Not on file  Occupational History  . Not on file  Social Needs  . Financial resource strain: Not on file  . Food insecurity    Worry: Not on file    Inability: Not on file  . Transportation needs    Medical: Not on file    Non-medical: Not on file  Tobacco Use  . Smoking  status: Current Every Day Smoker    Packs/day: 1.00    Years: 29.00    Pack years: 29.00    Types: Cigarettes  . Smokeless tobacco: Never Used  Substance and Sexual Activity  . Alcohol use: Not Currently    Comment: occasionally  . Drug use: No  . Sexual activity: Yes    Birth control/protection: Surgical  Lifestyle  . Physical activity    Days per week: Not on file    Minutes per session: Not on file  . Stress: Not on file  Relationships  . Social Musicianconnections    Talks on phone: Not on file    Gets together: Not on file    Attends religious service: Not on file    Active member of club or organization: Not on file    Attends meetings of clubs or organizations: Not on file    Relationship status: Not on file  Other Topics Concern  . Not on file  Social History Narrative   Patient lives in [place] with her 2 kids- 6414 in 49 years old.   She has total 4 kids.   She is a single mom.         Additional Social History:  Sleep: Good  Appetite:  Good  Current Medications: Current Facility-Administered Medications  Medication Dose Route Frequency Provider Last Rate Last Dose  . acetaminophen (TYLENOL) tablet 650 mg  650 mg Oral Q6H PRN Mordecai Maes, NP   650 mg at 12/19/18 2113  . albuterol (VENTOLIN HFA) 108 (90 Base) MCG/ACT inhaler 2 puff  2 puff Inhalation Q4H PRN Johnn Hai, MD      . alum & mag hydroxide-simeth (MAALOX/MYLANTA) 200-200-20 MG/5ML suspension 30 mL  30 mL Oral Q4H PRN Mordecai Maes, NP      . feeding supplement (ENSURE ENLIVE) (ENSURE ENLIVE) liquid 237 mL  237 mL Oral BID BM Derrill Center, NP   237 mL at 12/21/18 1031  . insulin aspart (novoLOG) injection 0-15 Units  0-15 Units Subcutaneous TID WC Johnn Hai, MD   3 Units at 12/20/18 1205  . metFORMIN (GLUCOPHAGE) tablet 500 mg  500 mg Oral BID WC Johnn Hai, MD   500 mg at 12/21/18 0817  . mirtazapine (REMERON) tablet 15 mg  15 mg Oral QHS Derrill Center, NP   15 mg at 12/19/18 2112  . prenatal multivitamin tablet 1 tablet  1 tablet Oral Daily Johnn Hai, MD   1 tablet at 12/21/18 0818  . risperiDONE (RISPERDAL) tablet 0.5 mg  0.5 mg Oral BID Derrill Center, NP   0.5 mg at 12/21/18 0818  . sertraline (ZOLOFT) tablet 50 mg  50 mg Oral Daily Derrill Center, NP   50 mg at 12/21/18 5462    Lab Results:  Results for orders placed or performed during the hospital encounter of 12/18/18 (from the past 48 hour(s))  Glucose, capillary     Status: Abnormal   Collection Time: 12/19/18 11:50 AM  Result Value Ref Range   Glucose-Capillary 116 (H) 70 - 99 mg/dL  Glucose, capillary     Status: None   Collection Time: 12/19/18  4:32 PM  Result Value Ref Range   Glucose-Capillary 87 70 - 99 mg/dL  TSH     Status: None   Collection Time: 12/19/18  6:00 PM  Result Value Ref Range   TSH 0.797 0.350 - 4.500 uIU/mL    Comment: Performed by a 3rd Generation assay with a functional sensitivity of <=0.01 uIU/mL. Performed at [facility], [place] [address], [place], [place] [address]   Glucose, capillary     Status: Abnormal   Collection Time: 12/19/18  8:13 PM  Result Value Ref Range   Glucose-Capillary 255 (H) 70 - 99 mg/dL  Glucose, capillary     Status: Abnormal   Collection Time: 12/20/18  6:05 AM  Result Value Ref Range   Glucose-Capillary 120 (H) 70 - 99 mg/dL  Hemoglobin A1c     Status: Abnormal   Collection Time: 12/20/18  6:45 AM  Result Value Ref Range   Hgb A1c MFr Bld 8.2 (H) 4.8 - 5.6 %    Comment: (NOTE) Pre diabetes:          5.7%-6.4% Diabetes:              >6.4% Glycemic control for   <7.0% adults with diabetes    Mean Plasma Glucose 188.64 mg/dL    Comment: Performed at [facility], [place] [address], [place], [place] [address]  Lipid panel     Status: Abnormal   Collection Time: 12/20/18  6:45 AM  Result Value Ref Range   Cholesterol 196 0 - 200 mg/dL   Triglycerides  99 <150 mg/dL   HDL 48 >16 mg/dL    Total CHOL/HDL Ratio 4.1 RATIO   VLDL 20 0 - 40 mg/dL   LDL Cholesterol 109 (H) 0 - 99 mg/dL    Comment:        Total Cholesterol/HDL:CHD Risk Coronary Heart Disease Risk Table                     Men   Women  1/2 Average Risk   3.4   3.3  Average Risk       5.0   4.4  2 X Average Risk   9.6   7.1  3 X Average Risk  23.4   11.0        Use the calculated Patient Ratio above and the CHD Risk Table to determine the patient's CHD Risk.        ATP III CLASSIFICATION (LDL):  <100     mg/dL   Optimal  604-540  mg/dL   Near or Above                    Optimal  130-159  mg/dL   Borderline  981-191  mg/dL   High  >478     mg/dL   Very High Performed at [facility], [address], [place], [place] [address]   Glucose, capillary     Status: Abnormal   Collection Time: 12/20/18 12:02 PM  Result Value Ref Range   Glucose-Capillary 169 (H) 70 - 99 mg/dL   Comment 1 Notify RN   Glucose, capillary     Status: Abnormal   Collection Time: 12/20/18  4:34 PM  Result Value Ref Range   Glucose-Capillary 115 (H) 70 - 99 mg/dL   Comment 1 Notify RN    Comment 2 Document in Chart   Glucose, capillary     Status: Abnormal   Collection Time: 12/20/18  8:19 PM  Result Value Ref Range   Glucose-Capillary 104 (H) 70 - 99 mg/dL  Glucose, capillary     Status: Abnormal   Collection Time: 12/21/18  6:13 AM  Result Value Ref Range   Glucose-Capillary 131 (H) 70 - 99 mg/dL    Blood Alcohol level:  Lab Results  Component Value Date   ETH <10 12/13/2018   ETH <10 06/08/2018    Metabolic Disorder Labs: Lab Results  Component Value Date   HGBA1C 8.2 (H) 12/20/2018   MPG 188.64 12/20/2018   MPG 197.25 12/14/2018   Lab Results  Component Value Date   PROLACTIN 10.6 08/31/2015   Lab Results  Component Value Date   CHOL 196 12/20/2018   TRIG 99 12/20/2018   HDL 48 12/20/2018   CHOLHDL 4.1 12/20/2018   VLDL 20 12/20/2018   LDLCALC 128 (H) 12/20/2018   LDLCALC 73  06/10/2018    Physical Findings: AIMS:  , ,  ,  ,    CIWA:    COWS:     Musculoskeletal: Strength & Muscle Tone: within normal limits Gait & Station: normal Patient leans: N/A  Psychiatric Specialty Exam: Physical Exam  ROS  Blood pressure 113/68, pulse (!) 53, temperature 98.6 F (37 C), temperature source Oral, resp. rate 16, height 5\' 4"  (1.626 m), weight 69.4 kg, SpO2 98 %.Body mass index is 26.26 kg/m.  General Appearance: Disheveled  Eye Contact:  Good  Speech:  Clear and Coherent  Volume:  Decreased  Mood:  Depressed  Affect:  Congruent  Thought  Process:  Coherent and Descriptions of Associations: Intact  Orientation:  Full (Time, Place, and Person)  Thought Content:  Logical  Suicidal Thoughts:  No  Homicidal Thoughts:  No  Memory:  Immediate;   Good  Judgement:  Fair  Insight:  Fair  Psychomotor Activity:  Normal  Concentration:  Concentration: Fair  Recall:  Fair  Fund of Knowledge:  Fair  Language:  Fair  Akathisia:  Negative  Handed:  Right  AIMS (if indicated):     Assets:  Physical Health Resilience  ADL's:  Intact  Cognition:  WNL  Sleep:  Number of Hours: 6.75     Treatment Plan Summary: Daily contact with patient to assess and evaluate symptoms and progress in treatment and Medication management no change in current meds or precautions probable discharge tomorrow continue current precautions  Kalsey Lull, MD 12/21/2018, 10:47 AM

## 2018-12-21 NOTE — Progress Notes (Signed)
D: Pt denies SI/HI/AVH. Pt is pleasant and cooperative. Pt continues to isolate to her room, but pt came out to take medications. Pt educated on consuming food and hydrating.   A: Pt was offered support and encouragement. Pt was given scheduled medications. Pt was encourage to attend groups. Q 15 minute checks were done for safety.   R:  safety maintained on unit.

## 2018-12-21 NOTE — Tx Team (Signed)
Interdisciplinary Treatment and Diagnostic Plan Update  12/21/2018 Time of Session: 09:02am  AHNESTY FINFROCK MRN: [id number]  Principal Diagnosis: Bipolar 1 disorder [facility])  Secondary Diagnoses: Principal Problem:   Bipolar 1 disorder ([place])   Current Medications:  Current Facility-Administered Medications  Medication Dose Route Frequency Provider Last Rate Last Dose  . acetaminophen (TYLENOL) tablet 650 mg  650 mg Oral Q6H PRN Mordecai Maes, NP   650 mg at 12/19/18 2113  . albuterol (VENTOLIN HFA) 108 (90 Base) MCG/ACT inhaler 2 puff  2 puff Inhalation Q4H PRN Johnn Hai, MD      . alum & mag hydroxide-simeth (MAALOX/MYLANTA) 200-200-20 MG/5ML suspension 30 mL  30 mL Oral Q4H PRN Mordecai Maes, NP      . feeding supplement (ENSURE ENLIVE) (ENSURE ENLIVE) liquid 237 mL  237 mL Oral BID BM Derrill Center, NP   237 mL at 12/20/18 1400  . insulin aspart (novoLOG) injection 0-15 Units  0-15 Units Subcutaneous TID WC Johnn Hai, MD   3 Units at 12/20/18 1205  . metFORMIN (GLUCOPHAGE) tablet 500 mg  500 mg Oral BID WC Johnn Hai, MD   500 mg at 12/21/18 0817  . mirtazapine (REMERON) tablet 15 mg  15 mg Oral QHS Derrill Center, NP   15 mg at 12/19/18 2112  . prenatal multivitamin tablet 1 tablet  1 tablet Oral Daily Johnn Hai, MD   1 tablet at 12/21/18 0818  . risperiDONE (RISPERDAL) tablet 0.5 mg  0.5 mg Oral BID Derrill Center, NP   0.5 mg at 12/21/18 0818  . sertraline (ZOLOFT) tablet 50 mg  50 mg Oral Daily Derrill Center, NP   50 mg at 12/21/18 0818   PTA Medications: Medications Prior to Admission  Medication Sig Dispense Refill Last Dose  . albuterol (PROVENTIL HFA;VENTOLIN HFA) 108 (90 Base) MCG/ACT inhaler Inhale 2 puffs into the lungs every 6 (six) hours as needed for wheezing or shortness of breath. (Patient not taking: Reported on 12/13/2018) 1 Inhaler 0   . blood glucose meter kit and supplies Dispense based on patient and insurance preference. Use up to four times  daily as directed. (FOR ICD-10 E10.9, E11.9). 1 each 0   . hydrOXYzine (ATARAX/VISTARIL) 25 MG tablet Take 1 tablet (25 mg total) by mouth 3 (three) times daily as needed for anxiety. 30 tablet 0   . insulin aspart (NOVOLOG) 100 UNIT/ML injection Inject 0-9 Units into the skin 3 (three) times daily with meals. 10 mL 0   . Insulin Pen Needle (PEN NEEDLES 3/16") 31G X 5 MM MISC 15 Units by Does not apply route daily. 100 each 0   . metFORMIN (GLUCOPHAGE) 1000 MG tablet Take 0.5 tablets (500 mg total) by mouth 2 (two) times daily. 30 tablet 0   . risperiDONE (RISPERDAL) 1 MG tablet Take 1 tablet (1 mg total) by mouth 2 (two) times daily.     . tamsulosin (FLOMAX) 0.4 MG CAPS capsule Take 1 capsule (0.4 mg total) by mouth at bedtime for 7 days. 30 capsule    . traZODone (DESYREL) 50 MG tablet Take 1 tablet (50 mg total) by mouth at bedtime as needed for sleep.       Patient Stressors: Financial difficulties Health problems Legal issue  Patient Strengths: Capable of independent living Supportive family/friends  Treatment Modalities: Medication Management, Group therapy, Case management,  1 to 1 session with clinician, Psychoeducation, Recreational therapy.   Physician Treatment Plan for Primary Diagnosis: Bipolar 1 disorder ([place])  Long Term Goal(s): Improvement in symptoms so as ready for discharge Improvement in symptoms so as ready for discharge   Short Term Goals: Ability to demonstrate self-control will improve Ability to identify and develop effective coping behaviors will improve Ability to maintain clinical measurements within normal limits will improve Ability to disclose and discuss suicidal ideas Ability to demonstrate self-control will improve Ability to identify and develop effective coping behaviors will improve  Medication Management: Evaluate patient's response, side effects, and tolerance of medication regimen.  Therapeutic Interventions: 1 to 1 sessions, Unit Group  sessions and Medication administration.  Evaluation of Outcomes: Not Progressing  Physician Treatment Plan for Secondary Diagnosis: Principal Problem:   Bipolar 1 disorder ([place])  Long Term Goal(s): Improvement in symptoms so as ready for discharge Improvement in symptoms so as ready for discharge   Short Term Goals: Ability to demonstrate self-control will improve Ability to identify and develop effective coping behaviors will improve Ability to maintain clinical measurements within normal limits will improve Ability to disclose and discuss suicidal ideas Ability to demonstrate self-control will improve Ability to identify and develop effective coping behaviors will improve     Medication Management: Evaluate patient's response, side effects, and tolerance of medication regimen.  Therapeutic Interventions: 1 to 1 sessions, Unit Group sessions and Medication administration.  Evaluation of Outcomes: Not Progressing   RN Treatment Plan for Primary Diagnosis: Bipolar 1 disorder ([place]) Long Term Goal(s): Knowledge of disease and therapeutic regimen to maintain health will improve  Short Term Goals: Ability to participate in decision making will improve, Ability to verbalize feelings will improve, Ability to disclose and discuss suicidal ideas, Ability to identify and develop effective coping behaviors will improve and Compliance with prescribed medications will improve  Medication Management: RN will administer medications as ordered by provider, will assess and evaluate patient's response and provide education to patient for prescribed medication. RN will report any adverse and/or side effects to prescribing provider.  Therapeutic Interventions: 1 on 1 counseling sessions, Psychoeducation, Medication administration, Evaluate responses to treatment, Monitor vital signs and CBGs as ordered, Perform/monitor CIWA, COWS, AIMS and Fall Risk screenings as ordered, Perform wound care treatments as  ordered.  Evaluation of Outcomes: Not Progressing   LCSW Treatment Plan for Primary Diagnosis: Bipolar 1 disorder ([place]) Long Term Goal(s): Safe transition to appropriate next level of care at discharge, Engage patient in therapeutic group addressing interpersonal concerns.  Short Term Goals: Engage patient in aftercare planning with referrals and resources and Increase skills for wellness and recovery  Therapeutic Interventions: Assess for all discharge needs, 1 to 1 time with Social worker, Explore available resources and support systems, Assess for adequacy in community support network, Educate family and significant other(s) on suicide prevention, Complete Psychosocial Assessment, Interpersonal group therapy.  Evaluation of Outcomes: Not Progressing   Progress in Treatment: Attending groups: Yes. Participating in groups: Yes. Taking medication as prescribed: Yes. Toleration medication: Yes. Family/Significant other contact made: Yes, individual(s) contacted:  pt's daughter in law Patient understands diagnosis: Yes. Discussing patient identified problems/goals with staff: Yes. Medical problems stabilized or resolved: Yes. Denies suicidal/homicidal ideation: No. Issues/concerns per patient self-inventory: No. Other:   New problem(s) identified: No, Describe:  None  New Short Term/Long Term Goal(s): Medication stabilization, elimination of SI thoughts, and development of a comprehensive mental wellness plan.   Patient Goals:  "get better'  Discharge Plan or Barriers: CSW will continue to follow up for appropriate referrals and possible discharge planning  Reason for Continuation of Hospitalization: Depression  Medication stabilization Suicidal ideation  Estimated Length of Stay: 3-5 days  Attendees: Patient: 12/21/2018   Physician: Dr. Johnn Hai, MD 12/21/2018  Nursing: Benjamine Mola, RN  12/21/2018   RN Care Manager: 12/21/2018  Social Worker: Ardelle Anton, LCSW 12/21/2018    Recreational Therapist:  12/21/2018   Other:  12/21/2018   Other:  12/21/2018   Other: 12/21/2018      Scribe for Treatment Team: Trecia Rogers, LCSW 12/21/2018 10:01 AM

## 2018-12-22 LAB — GLUCOSE, CAPILLARY
Glucose-Capillary: 121 mg/dL — ABNORMAL HIGH (ref 70–99)
Glucose-Capillary: 134 mg/dL — ABNORMAL HIGH (ref 70–99)
Glucose-Capillary: 129 mg/dL — ABNORMAL HIGH (ref 70–99)
Glucose-Capillary: 144 mg/dL — ABNORMAL HIGH (ref 70–99)

## 2018-12-22 MED ORDER — MIRTAZAPINE 15 MG PO TABS: 15.0000 mg | ORAL_TABLET | Freq: Every day | ORAL | 2 refills | Status: DC

## 2018-12-22 MED ORDER — SERTRALINE HCL 100 MG PO TABS: 100.0000 mg | ORAL_TABLET | Freq: Every day | ORAL | 2 refills | Status: DC

## 2018-12-22 NOTE — Progress Notes (Signed)
[facility] MD Progress Note  12/22/2018 9:31 AM [name] [name]  MRN:  [id number] Subjective:   Patient was planning on discharge today family members phone to request 1 more day to clear out her trailer prior to eviction patient is alert and oriented without thoughts of self-harm discharge tomorrow  Principal Problem: Bipolar 1 disorder ([place]) Diagnosis: Principal Problem:   Bipolar 1 disorder ([name])  Total Time spent with patient: 45 minutes  Past Medical History:  Past Medical History:  Diagnosis Date  . Anxiety disorder   . Asthma   . Bipolar affective disorder ([place])   . Depression   . Diabetes mellitus   . Migraine   . Schizophrenia [facility])     Past Surgical History:  Procedure Laterality Date  . KNEE ARTHROSCOPY     x 2  . KNEE SURGERY Left    Incision made and knee cleaned out  . TUBAL LIGATION     Family History:  Family History  Problem Relation Age of Onset  . Bipolar disorder Mother   . Hypertension Father   . Diabetes Father    Family Psychiatric  History: see eval Social History:  Social History   Substance and Sexual Activity  Alcohol Use Not Currently   Comment: occasionally     Social History   Substance and Sexual Activity  Drug Use No    Social History   Socioeconomic History  . Marital status: Divorced    Spouse name: Not on file  . Number of children: Not on file  . Years of education: Not on file  . Highest education level: Not on file  Occupational History  . Not on file  Social Needs  . Financial resource strain: Not on file  . Food insecurity    Worry: Not on file    Inability: Not on file  . Transportation needs    Medical: Not on file    Non-medical: Not on file  Tobacco Use  . Smoking status: Current Every Day Smoker    Packs/day: 1.00    Years: 29.00    Pack years: 29.00    Types: Cigarettes  . Smokeless tobacco: Never Used  Substance and Sexual Activity  . Alcohol use: Not Currently    Comment: occasionally  . Drug use:  No  . Sexual activity: Yes    Birth control/protection: Surgical  Lifestyle  . Physical activity    Days per week: Not on file    Minutes per session: Not on file  . Stress: Not on file  Relationships  . Social Herbalist on phone: Not on file    Gets together: Not on file    Attends religious service: Not on file    Active member of club or organization: Not on file    Attends meetings of clubs or organizations: Not on file    Relationship status: Not on file  Other Topics Concern  . Not on file  Social History Narrative   Patient lives in [place] with her 2 kids- 49 in 47 years old.   She has total 4 kids.   She is a single mom.        Musculoskeletal: Strength & Muscle Tone: within normal limits Gait & Station: normal Patient leans: N/A  Psychiatric Specialty Exam: ROS  Blood pressure 108/75, pulse (!) 126, temperature 98 F (36.7 C), temperature source Oral, resp. rate 16, height 5\' 4"  (1.626 m), weight 69.4 kg, SpO2 98 %.Body mass index is  26.26 kg/m.  General Appearance: Casual  Eye Contact::  Good  Speech:  Clear and Coherent409  Volume:  Normal  Mood:  Dysphoric  Affect:  Full Range  Thought Process:  Coherent and Goal Directed  Orientation:  Full (Time, Place, and Person)  Thought Content:  Tangential  Suicidal Thoughts:  No  Homicidal Thoughts:  No  Memory:  Immediate;   Fair  Judgement:  Intact  Insight:  Fair  Psychomotor Activity:  Normal  Concentration:  Good  Recall:  Good  Fund of Knowledge:Good  Language: Good  Akathisia:  Negative  Handed:  Right  AIMS (if indicated):     Assets:  Communication Skills Desire for Improvement Leisure Time Physical Health  Sleep:  Number of Hours: 6.75  Cognition: WNL  ADL's:  Intact    Additional Social History:                         Sleep: Good  Appetite:  Good  Current Medications: Current Facility-Administered Medications  Medication Dose Route Frequency Provider  Last Rate Last Dose  . acetaminophen (TYLENOL) tablet 650 mg  650 mg Oral Q6H PRN Denzil Magnuson, NP   650 mg at 12/19/18 2113  . albuterol (VENTOLIN HFA) 108 (90 Base) MCG/ACT inhaler 2 puff  2 puff Inhalation Q4H PRN Malvin Johns, MD      . alum & mag hydroxide-simeth (MAALOX/MYLANTA) 200-200-20 MG/5ML suspension 30 mL  30 mL Oral Q4H PRN Denzil Magnuson, NP      . feeding supplement (ENSURE ENLIVE) (ENSURE ENLIVE) liquid 237 mL  237 mL Oral BID BM Oneta Rack, NP   237 mL at 12/21/18 1714  . insulin aspart (novoLOG) injection 0-15 Units  0-15 Units Subcutaneous TID WC Malvin Johns, MD   3 Units at 12/20/18 1205  . metFORMIN (GLUCOPHAGE) tablet 500 mg  500 mg Oral BID WC Malvin Johns, MD   500 mg at 12/22/18 0747  . mirtazapine (REMERON) tablet 15 mg  15 mg Oral QHS Oneta Rack, NP   15 mg at 12/21/18 2115  . prenatal multivitamin tablet 1 tablet  1 tablet Oral Daily Malvin Johns, MD   1 tablet at 12/22/18 0747  . risperiDONE (RISPERDAL) tablet 0.5 mg  0.5 mg Oral BID Oneta Rack, NP   0.5 mg at 12/22/18 0747  . sertraline (ZOLOFT) tablet 50 mg  50 mg Oral Daily Oneta Rack, NP   50 mg at 12/22/18 0747    Lab Results:  Results for orders placed or performed during the hospital encounter of 12/18/18 (from the past 48 hour(s))  Glucose, capillary     Status: Abnormal   Collection Time: 12/20/18 12:02 PM  Result Value Ref Range   Glucose-Capillary 169 (H) 70 - 99 mg/dL   Comment 1 Notify RN   Glucose, capillary     Status: Abnormal   Collection Time: 12/20/18  4:34 PM  Result Value Ref Range   Glucose-Capillary 115 (H) 70 - 99 mg/dL   Comment 1 Notify RN    Comment 2 Document in Chart   Glucose, capillary     Status: Abnormal   Collection Time: 12/20/18  8:19 PM  Result Value Ref Range   Glucose-Capillary 104 (H) 70 - 99 mg/dL  Glucose, capillary     Status: Abnormal   Collection Time: 12/21/18  6:13 AM  Result Value Ref Range   Glucose-Capillary 131 (H) 70 - 99  mg/dL  Glucose,  capillary     Status: Abnormal   Collection Time: 12/21/18 11:36 AM  Result Value Ref Range   Glucose-Capillary 158 (H) 70 - 99 mg/dL   Comment 1 Notify RN    Comment 2 Document in Chart   Glucose, capillary     Status: None   Collection Time: 12/21/18  5:07 PM  Result Value Ref Range   Glucose-Capillary 92 70 - 99 mg/dL   Comment 1 Notify RN    Comment 2 Document in Chart   Glucose, capillary     Status: Abnormal   Collection Time: 12/21/18  8:31 PM  Result Value Ref Range   Glucose-Capillary 146 (H) 70 - 99 mg/dL  Glucose, capillary     Status: Abnormal   Collection Time: 12/22/18  5:48 AM  Result Value Ref Range   Glucose-Capillary 121 (H) 70 - 99 mg/dL    Blood Alcohol level:  Lab Results  Component Value Date   ETH <10 12/13/2018   ETH <10 06/08/2018    Metabolic Disorder Labs: Lab Results  Component Value Date   HGBA1C 8.2 (H) 12/20/2018   MPG 188.64 12/20/2018   MPG 197.25 12/14/2018   Lab Results  Component Value Date   PROLACTIN 10.6 08/31/2015   Lab Results  Component Value Date   CHOL 196 12/20/2018   TRIG 99 12/20/2018   HDL 48 12/20/2018   CHOLHDL 4.1 12/20/2018   VLDL 20 12/20/2018   LDLCALC 128 (H) 12/20/2018   LDLCALC 73 06/10/2018       Treatment Plan Summary: Daily contact with patient to assess and evaluate symptoms and progress in treatment and Medication management we will discharge her tomorrow again she understands this day may not be covered but she states that family wishes to clear out her trailer prior to eviction  Malvin JohnsFARAH,Marjani Kobel, MD 12/22/2018, 9:31 AM

## 2018-12-22 NOTE — Progress Notes (Signed)
Recreation Therapy Notes  Date: 7.14.20 Time: 1012 Location: [address] Dayroom  Group Topic: Anxiety  Goal Area(s) Addresses:  Patient will identify what makes them anxious. Patient will identify coping skills for anxiety.  Intervention: Worksheet  Activity: Introduction to Anxiety.  Patients were to identify what causes anxiety, physical symptoms they experience, thoughts they experience and coping skills they use for anxiety.  Education: Wellness, Discharge Planning.   Education Outcome: Acknowledges education/In group clarification offered/Needs additional education.   Clinical Observations/Feedback:  Pt did not attend group.    Averi Cacioppo, LRT/CTRS         Chere Babson A 12/22/2018 11:24 AM 

## 2018-12-22 NOTE — Progress Notes (Signed)
Patient ID: [name] [name], female   DOB: 1971-05-04, 48 y.o.   MRN: [id number]  Pt's daughter-in-law contacted CSW regarding needing a psych evaluation completed for [facility]. CSW informed daughter-in-law that the pt would need to go an outpatient psychiatrist to get that obtained. Pt's daughter-in-law asked about obtaining other medical records. CSW informed pt's daughter-in-law in how to obtain those records. CSW discussed [facility]'s transitional care team.

## 2018-12-22 NOTE — Plan of Care (Signed)
Progress note  D: pt found in bed; allowed to rest. Pt has been minimal and reclusive today. Pt has stayed in bed most of the day. Pt is animated and assertive when awake. Pt denies any physical pain or symptoms. Pt denies si/hi/ah/vh and verbally agrees to approach staff if these become apparent or before harming himself/others while at [facility]: Pt provided support and encouragement. Pt given medication per protocol and standing orders. Q42m safety checks implemented and continued.  R: Pt safe on the unit. Will continue to monitor.  Pt progressing in the following metrics  Problem: Education: Goal: Knowledge of [facility] Education information/materials will improve Outcome: Progressing Goal: Emotional status will improve Outcome: Progressing Goal: Mental status will improve Outcome: Progressing Goal: Verbalization of understanding the information provided will improve Outcome: Progressing

## 2018-12-22 NOTE — BHH Group Notes (Signed)
[facility] LCSW Group Therapy Note  Date/Time: 12/22/2018 @ 11am  Type of Therapy and Topic:  Group Therapy:  Overcoming Obstacles  Participation Level:  Did not attend group  Description of Group:    In this group patients will be encouraged to explore what they see as obstacles to their own wellness and recovery. They will be guided to discuss their thoughts, feelings, and behaviors related to these obstacles. The group will process together ways to cope with barriers, with attention given to specific choices patients can make. Each patient will be challenged to identify changes they are motivated to make in order to overcome their obstacles. This group will be process-oriented, with patients participating in exploration of their own experiences as well as giving and receiving support and challenge from other group members.  Therapeutic Goals: 1. Patient will identify personal and current obstacles as they relate to admission. 2. Patient will identify barriers that currently interfere with their wellness or overcoming obstacles.  3. Patient will identify feelings, thought process and behaviors related to these barriers. 4. Patient will identify two changes they are willing to make to overcome these obstacles:    Summary of Patient Progress  Patient did not attend group.     Therapeutic Modalities:   Cognitive Behavioral Therapy Solution Focused Therapy Motivational Interviewing Relapse Prevention Therapy   Ardelle Anton, LCSW

## 2018-12-22 NOTE — BHH Suicide Risk Assessment (Signed)
[facility] Discharge Suicide Risk Assessment   Principal Problem: Bipolar 1 disorder [facility]) Discharge Diagnoses: Principal Problem:   Bipolar 1 disorder ([place])   Total Time spent with patient: 45 minutes  Musculoskeletal: Strength & Muscle Tone: within normal limits Gait & Station: normal Patient leans: N/A  Psychiatric Specialty Exam: ROS  Blood pressure 108/75, pulse (!) 126, temperature 98 F (36.7 C), temperature source Oral, resp. rate 16, height 5\' 4"  (1.626 m), weight 69.4 kg, SpO2 98 %.Body mass index is 26.26 kg/m.  General Appearance: Casual  Eye Contact::  Good  Speech:  Clear and [id number]  Volume:  Normal  Mood:  Dysphoric  Affect:  Full Range  Thought Process:  Coherent and Goal Directed  Orientation:  Full (Time, Place, and Person)  Thought Content:  Tangential  Suicidal Thoughts:  No  Homicidal Thoughts:  No  Memory:  Immediate;   Fair  Judgement:  Intact  Insight:  Fair  Psychomotor Activity:  Normal  Concentration:  Good  Recall:  Good  Fund of Knowledge:Good  Language: Good  Akathisia:  Negative  Handed:  Right  AIMS (if indicated):     Assets:  Communication Skills Desire for Improvement Leisure Time Physical Health  Sleep:  Number of Hours: 6.75  Cognition: WNL  ADL's:  Intact   Mental Status Per Nursing Assessment::   On Admission:  Suicidal ideation indicated by patient  Demographic Factors:  Caucasian, Low socioeconomic status and Unemployed  Loss Factors: NA  Historical Factors: NA  Risk Reduction Factors:   Sense of responsibility to family and Religious beliefs about death  Continued Clinical Symptoms:  Dysthymia  Cognitive Features That Contribute To Risk:  None    Suicide Risk:  Minimal: No identifiable suicidal ideation.  Patients presenting with no risk factors but with morbid ruminations; may be classified as minimal risk based on the severity of the depressive symptoms    Plan Of Care/Follow-up recommendations:   Activity:  full  Dedric Ethington, MD 12/22/2018, 8:12 AM

## 2018-12-22 NOTE — Progress Notes (Signed)
D: Pt denies SI/HI/AVH. Pt is pleasant and cooperative. Pt stated she felt the same, pt came out the room for a little while. Pt stated she ate some today, pt educated on hydration and food with pt understanding. Pt brightened on approach and while talking.  A: Pt was offered support and encouragement. Pt was given scheduled medications. Pt was encourage to attend groups. Q 15 minute checks were done for safety.  R: Pt has no complaints.Pt receptive to treatment and safety maintained on unit.  Problem: Education: Goal: Verbalization of understanding the information provided will improve Outcome: Progressing   Problem: Coping: Goal: Coping ability will improve Outcome: Progressing

## 2018-12-23 LAB — GLUCOSE, CAPILLARY
Glucose-Capillary: 132 mg/dL — ABNORMAL HIGH (ref 70–99)
Glucose-Capillary: 118 mg/dL — ABNORMAL HIGH (ref 70–99)

## 2018-12-23 MED ORDER — BENZTROPINE MESYLATE 1 MG PO TABS: 1.0000 mg | ORAL_TABLET | Freq: Two times a day (BID) | ORAL | 2 refills | Status: DC

## 2018-12-23 MED ORDER — RISPERIDONE 2 MG PO TABS: 2.0000 mg | ORAL_TABLET | Freq: Two times a day (BID) | ORAL | 2 refills | Status: DC

## 2018-12-23 NOTE — Progress Notes (Addendum)
Patient ID: [name] [name], female   DOB: 05/02/1971, 48 y.o.   MRN: [id number]   CSW attempted to call Norvel Richards for the [facility] for support for the patient's housing situation once she is discharged until she can get in at [facility]. CSW called the adult services hotline [phone] but was unable to find any resources for the patient. Per Elsie Amis at [facility], patient is in need of financial assistance to keep her housing until she can possibly be placed in a group home. Adult services took CSW's information and sent to a supervisor to call back.

## 2018-12-23 NOTE — Progress Notes (Signed)
   12/23/18 0812  COVID-19 Daily Checkoff  If you have had runny nose, nasal congestion, sneezing in the past 24 hours, has it worsened? No  COVID-19 EXPOSURE  Have you traveled outside the state in the past 14 days? No  Have you been in contact with someone with a confirmed diagnosis of COVID-19 or PUI in the past 14 days without wearing appropriate PPE? No  Have you been living in the same home as a person with confirmed diagnosis of COVID-19 or a PUI (household contact)? No  Have you been diagnosed with COVID-19? No

## 2018-12-23 NOTE — Tx Team (Signed)
Interdisciplinary Treatment and Diagnostic Plan Update  12/23/2018 Time of Session: 09:02am [name] [name] MRN: [id number]  Principal Diagnosis: Bipolar 1 disorder [facility]  Secondary Diagnoses: Principal Problem:   Bipolar 1 disorder ([place])   Current Medications:  Current Facility-Administered Medications  Medication Dose Route Frequency Provider Last Rate Last Dose  . acetaminophen (TYLENOL) tablet 650 mg  650 mg Oral Q6H PRN Mordecai Maes, NP   650 mg at 12/19/18 2113  . albuterol (VENTOLIN HFA) 108 (90 Base) MCG/ACT inhaler 2 puff  2 puff Inhalation Q4H PRN Johnn Hai, MD      . alum & mag hydroxide-simeth (MAALOX/MYLANTA) 200-200-20 MG/5ML suspension 30 mL  30 mL Oral Q4H PRN Mordecai Maes, NP      . feeding supplement (ENSURE ENLIVE) (ENSURE ENLIVE) liquid 237 mL  237 mL Oral BID BM Derrill Center, NP   237 mL at 12/21/18 1714  . insulin aspart (novoLOG) injection 0-15 Units  0-15 Units Subcutaneous TID WC Johnn Hai, MD   2 Units at 12/22/18 1723  . metFORMIN (GLUCOPHAGE) tablet 500 mg  500 mg Oral BID WC Johnn Hai, MD   500 mg at 12/23/18 0092  . mirtazapine (REMERON) tablet 15 mg  15 mg Oral QHS Derrill Center, NP   15 mg at 12/22/18 2054  . prenatal multivitamin tablet 1 tablet  1 tablet Oral Daily Johnn Hai, MD   1 tablet at 12/23/18 [phone]  . risperiDONE (RISPERDAL) tablet 0.5 mg  0.5 mg Oral BID Derrill Center, NP   0.5 mg at 12/23/18 7622  . sertraline (ZOLOFT) tablet 50 mg  50 mg Oral Daily Derrill Center, NP   50 mg at 12/23/18 6333   PTA Medications: Medications Prior to Admission  Medication Sig Dispense Refill Last Dose  . albuterol (PROVENTIL HFA;VENTOLIN HFA) 108 (90 Base) MCG/ACT inhaler Inhale 2 puffs into the lungs every 6 (six) hours as needed for wheezing or shortness of breath. (Patient not taking: Reported on 12/13/2018) 1 Inhaler 0   . blood glucose meter kit and supplies Dispense based on patient and insurance preference. Use up to four times  daily as directed. (FOR ICD-10 E10.9, E11.9). 1 each 0   . hydrOXYzine (ATARAX/VISTARIL) 25 MG tablet Take 1 tablet (25 mg total) by mouth 3 (three) times daily as needed for anxiety. 30 tablet 0   . insulin aspart (NOVOLOG) 100 UNIT/ML injection Inject 0-9 Units into the skin 3 (three) times daily with meals. 10 mL 0   . Insulin Pen Needle (PEN NEEDLES 3/16") 31G X 5 MM MISC 15 Units by Does not apply route daily. 100 each 0   . metFORMIN (GLUCOPHAGE) 1000 MG tablet Take 0.5 tablets (500 mg total) by mouth 2 (two) times daily. 30 tablet 0   . risperiDONE (RISPERDAL) 1 MG tablet Take 1 tablet (1 mg total) by mouth 2 (two) times daily.     . tamsulosin (FLOMAX) 0.4 MG CAPS capsule Take 1 capsule (0.4 mg total) by mouth at bedtime for 7 days. 30 capsule    . traZODone (DESYREL) 50 MG tablet Take 1 tablet (50 mg total) by mouth at bedtime as needed for sleep.       Patient Stressors: Financial difficulties Health problems Legal issue  Patient Strengths: Capable of independent living Supportive family/friends  Treatment Modalities: Medication Management, Group therapy, Case management,  1 to 1 session with clinician, Psychoeducation, Recreational therapy.   Physician Treatment Plan for Primary Diagnosis: Bipolar 1 disorder ([place]) Long  Term Goal(s): Improvement in symptoms so as ready for discharge Improvement in symptoms so as ready for discharge   Short Term Goals: Ability to demonstrate self-control will improve Ability to identify and develop effective coping behaviors will improve Ability to maintain clinical measurements within normal limits will improve Ability to disclose and discuss suicidal ideas Ability to demonstrate self-control will improve Ability to identify and develop effective coping behaviors will improve  Medication Management: Evaluate patient's response, side effects, and tolerance of medication regimen.  Therapeutic Interventions: 1 to 1 sessions, Unit Group  sessions and Medication administration.  Evaluation of Outcomes: Adequate for Discharge  Physician Treatment Plan for Secondary Diagnosis: Principal Problem:   Bipolar 1 disorder ([place])  Long Term Goal(s): Improvement in symptoms so as ready for discharge Improvement in symptoms so as ready for discharge   Short Term Goals: Ability to demonstrate self-control will improve Ability to identify and develop effective coping behaviors will improve Ability to maintain clinical measurements within normal limits will improve Ability to disclose and discuss suicidal ideas Ability to demonstrate self-control will improve Ability to identify and develop effective coping behaviors will improve     Medication Management: Evaluate patient's response, side effects, and tolerance of medication regimen.  Therapeutic Interventions: 1 to 1 sessions, Unit Group sessions and Medication administration.  Evaluation of Outcomes: Adequate for Discharge   RN Treatment Plan for Primary Diagnosis: Bipolar 1 disorder ([place]) Long Term Goal(s): Knowledge of disease and therapeutic regimen to maintain health will improve  Short Term Goals: Ability to participate in decision making will improve, Ability to verbalize feelings will improve, Ability to disclose and discuss suicidal ideas, Ability to identify and develop effective coping behaviors will improve and Compliance with prescribed medications will improve  Medication Management: RN will administer medications as ordered by provider, will assess and evaluate patient's response and provide education to patient for prescribed medication. RN will report any adverse and/or side effects to prescribing provider.  Therapeutic Interventions: 1 on 1 counseling sessions, Psychoeducation, Medication administration, Evaluate responses to treatment, Monitor vital signs and CBGs as ordered, Perform/monitor CIWA, COWS, AIMS and Fall Risk screenings as ordered, Perform wound care  treatments as ordered.  Evaluation of Outcomes: Adequate for Discharge   LCSW Treatment Plan for Primary Diagnosis: Bipolar 1 disorder ([place]) Long Term Goal(s): Safe transition to appropriate next level of care at discharge, Engage patient in therapeutic group addressing interpersonal concerns.  Short Term Goals: Engage patient in aftercare planning with referrals and resources and Increase skills for wellness and recovery  Therapeutic Interventions: Assess for all discharge needs, 1 to 1 time with Social worker, Explore available resources and support systems, Assess for adequacy in community support network, Educate family and significant other(s) on suicide prevention, Complete Psychosocial Assessment, Interpersonal group therapy.  Evaluation of Outcomes: Adequate for Discharge   Progress in Treatment: Attending groups: No. Participating in groups: No. Taking medication as prescribed: Yes. Toleration medication: Yes. Family/Significant other contact made: Yes, individual(s) contacted:  pt's daughter-in-law Patient understands diagnosis: No. Discussing patient identified problems/goals with staff: Yes. Medical problems stabilized or resolved: Yes. Denies suicidal/homicidal ideation: Yes. Issues/concerns per patient self-inventory: No. Other:   New problem(s) identified: No, Describe:  None  New Short Term/Long Term Goal(s): Medication stabilization, elimination of SI thoughts, and development of a comprehensive mental wellness plan.   Patient Goals:  "get better"   Discharge Plan or Barriers: Patient is discharging today and following up with [facility] for medication management/therapy and TCT.   Reason for  Continuation of Hospitalization: Patient is discharging today.   Estimated Length of Stay: Patient is discharging today.   Attendees: Patient: 12/23/2018   Physician: Dr. Johnn Hai, MD 12/23/2018   Nursing: Wille Glaser RN 12/23/2018   RN Care Manager: 12/23/2018   Social Worker:  Ardelle Anton, LCSW 12/23/2018   Recreational Therapist:  12/23/2018  Other:  12/23/2018   Other:  12/23/2018  Other: 12/23/2018      Scribe for Treatment Team: Trecia Rogers, LCSW 12/23/2018 10:50 AM

## 2018-12-23 NOTE — Progress Notes (Signed)
D: Pt A & O X 3. Denies SI, HI, AVH and pain at this time. D/C home as ordered. Patient presenting depressed, but improved since Sunday. Picked up in lobby by family. A: D/C instructions reviewed with pt including prescriptions and follow up appointment, compliance encouraged. All belongings from locker given to pt at time of departure. Scheduled and PRN medications given with verbal education and effects monitored. Safety checks maintained without incident till time of d/c.  R: Pt receptive to care. Compliant with medications when offered. Denies adverse drug reactions when assessed. Verbalized understanding related to d/c instructions. Signed belonging sheet in agreement with items received from locker. Ambulatory with a steady gait. Appears to be in no physical distress at time of departure.

## 2018-12-23 NOTE — Progress Notes (Signed)
Recreation Therapy Notes  Date: 7.15.20 Time: 1010 Location:  500 [address] Dayroom  Group Topic: Wellness  Goal Area(s) Addresses:  Patient will define components of whole wellness. Patient will verbalize benefit of whole wellness.  Intervention: Exercise, Music  Activity: Exercise.  LRT played music and allowed each patient to lead the group in an exercise.  Patients were allowed to take breaks as needed.  Education: Wellness, Discharge Planning.   Education Outcome: Acknowledges education/In group clarification offered/Needs additional education.   Clinical Observations/Feedback: Pt did not attend group.    Reana Chacko, LRT/CTRS         Mitchelle Sultan A 12/23/2018 11:17 AM 

## 2018-12-23 NOTE — Progress Notes (Signed)
Did not attend group 

## 2018-12-23 NOTE — BHH Suicide Risk Assessment (Signed)
[facility] Discharge Suicide Risk Assessment   Principal Problem: Bipolar 1 disorder [facility]) Discharge Diagnoses: Principal Problem:   Bipolar 1 disorder ([place])   Total Time spent with patient: 45 minutes  Musculoskeletal: Strength & Muscle Tone: within normal limits Gait & Station: normal Patient leans: N/A  Psychiatric Specialty Exam: ROS  Blood pressure 108/75, pulse (!) 126, temperature 98 F (36.7 C), temperature source Oral, resp. rate 16, height 5\' 4"  (1.626 m), weight 69.4 kg, SpO2 98 %.Body mass index is 26.26 kg/m.  General Appearance: Casual  Eye Contact::  Good  Speech:  Clear and [id number]  Volume:  Normal  Mood:  Dysphoric  Affect:  Full Range  Thought Process:  Coherent and Goal Directed  Orientation:  Full (Time, Place, and Person)  Thought Content:  Tangential  Suicidal Thoughts:  No  Homicidal Thoughts:  No  Memory:  Immediate;   Fair  Judgement:  Intact  Insight:  Fair  Psychomotor Activity:  Normal  Concentration:  Good  Recall:  Good  Fund of Knowledge:Good  Language: Good  Akathisia:  Negative  Handed:  Right  AIMS (if indicated):     Assets:  Communication Skills Desire for Improvement Leisure Time Physical Health  Sleep:  Number of Hours: 6.75  Cognition: WNL  ADL's:  Intact   Mental Status Per Nursing Assessment::   On Admission:  Suicidal ideation indicated by patient  Demographic Factors:  Caucasian, Low socioeconomic status and Unemployed  Loss Factors: NA  Historical Factors: NA  Risk Reduction Factors:   Sense of responsibility to family and Religious beliefs about death  Continued Clinical Symptoms:  Dysthymia  Cognitive Features That Contribute To Risk:  None    Suicide Risk:  Minimal: No identifiable suicidal ideation.  Patients presenting with no risk factors but with morbid ruminations; may be classified as minimal risk based on the severity of the depressive symptoms    Plan Of Care/Follow-up  recommendations:  Activity:  full  Lawsen Arnott, MD 12/23/2018, 8:15 AM

## 2018-12-23 NOTE — Discharge Summary (Signed)
Physician Discharge Summary Note  Patient:  [name] [name] is an 48 y.o., female MRN:  [id number] DOB:  1970/08/18 Patient phone:  [phone] (home)  Patient address:   [address] [place] [address],  Total Time spent with patient: 15 minutes  Date of Admission:  12/18/2018 Date of Discharge: 12/23/18  Reason for Admission:  Overdose on 50 lisinopril 10 mg tabs and 50 Benadryl 25 mg tabs  Principal Problem: Bipolar 1 disorder [facility]) Discharge Diagnoses: Principal Problem:   Bipolar 1 disorder ([place])   Past Psychiatric History: History of bipolar disorder with psychosis and prior hospitalizations.  Past Medical History:  Past Medical History:  Diagnosis Date  . Anxiety disorder   . Asthma   . Bipolar affective disorder ([place])   . Depression   . Diabetes mellitus   . Migraine   . Schizophrenia [facility])     Past Surgical History:  Procedure Laterality Date  . KNEE ARTHROSCOPY     x 2  . KNEE SURGERY Left    Incision made and knee cleaned out  . TUBAL LIGATION     Family History:  Family History  Problem Relation Age of Onset  . Bipolar disorder Mother   . Hypertension Father   . Diabetes Father    Family Psychiatric  History: Mother with bipolar disorder. Social History:  Social History   Substance and Sexual Activity  Alcohol Use Not Currently   Comment: occasionally     Social History   Substance and Sexual Activity  Drug Use No    Social History   Socioeconomic History  . Marital status: Divorced    Spouse name: Not on file  . Number of children: Not on file  . Years of education: Not on file  . Highest education level: Not on file  Occupational History  . Not on file  Social Needs  . Financial resource strain: Not on file  . Food insecurity    Worry: Not on file    Inability: Not on file  . Transportation needs    Medical: Not on file    Non-medical: Not on file  Tobacco Use  . Smoking status: Current Every Day Smoker    Packs/day:  1.00    Years: 29.00    Pack years: 29.00    Types: Cigarettes  . Smokeless tobacco: Never Used  Substance and Sexual Activity  . Alcohol use: Not Currently    Comment: occasionally  . Drug use: No  . Sexual activity: Yes    Birth control/protection: Surgical  Lifestyle  . Physical activity    Days per week: Not on file    Minutes per session: Not on file  . Stress: Not on file  Relationships  . Social Herbalist on phone: Not on file    Gets together: Not on file    Attends religious service: Not on file    Active member of club or organization: Not on file    Attends meetings of clubs or organizations: Not on file    Relationship status: Not on file  Other Topics Concern  . Not on file  Social History Narrative   Patient lives in [place] with her 2 kids- 8 in 41 years old.   She has total 4 kids.   She is a single mom.          Hospital Course:  From admission H&P: [name] [name] is well-known to the service she is 48 years of age  and she was last admitted here in January, she has a history of a bipolar type condition involving psychosis and when she is ill often has disorganized thought and behavior, she had been stabilized on Risperdal and Tegretol during her last hospitalization.  Comorbidities include type I diabetes. She required psychiatric transfer after an overdose, she presented initially on 7/5 after an intentional overdose on lisinopril and Benadryl about an hour before calling EMS.  She acknowledges that she wanted to die and "did not feel like living" she reported she is going to be evicted from her trailer, her son is spending less time with her and she had attempted to cut her wrists over the last few days prior to this admission as well. When I interviewed the patient she is in the rather confusional state she knows the year is 2020, she thinks it is Saturday, she is unsure of the time of day, of course she knows her name she knows she is at [facility]  but cannot tell me what ward.  She states "I do not know" to most of the questions.  She denies auditory or visual hallucinations.  Denies current thoughts of harming self. During her last admission it was more of a manic and disorganized state that required stabilization but there were no depressive symptoms or suicidal thoughts plans or intent during her last hospital stay.  [name] was admitted after overdose on 50 lisinopril 10 mg tabs and 50 Benadryl 25 mg tabs. She was started on Remeron and Zoloft. Risperdal was continued. Medications for type 2 diabetes were continued. She responded well to treatment with no adverse effects reported. She has shown improved mood, affect, sleep, and interaction. She is discharging on the medications listed below. On day of discharge, patient is alert, calm and oriented x3. No delusional thought content expressed. No signs of responding to internal stimuli. She expresses concern regarding housing, but mood and affect brighten when she is informed of discharge planning in effect for housing. CSW has been in communication with patient's family. Patient will be returning to her trailer temporarily, with follow-up appointments scheduled for plan to transition to group home through [facility] with her family's assistance. She denies any SI/HI/AVH and contracts for safety. She agrees to follow up at [facility] (see below). Patient is provided with prescriptions for medications upon discharge. She is discharging home via [place].  Physical Findings: AIMS:  , ,  ,  ,    CIWA:    COWS:     Musculoskeletal: Strength & Muscle Tone: within normal limits Gait & Station: normal Patient leans: N/A  Psychiatric Specialty Exam: Physical Exam  Nursing note and vitals reviewed. Constitutional: She is oriented to person, place, and time. She appears well-developed and well-nourished.  Cardiovascular: Normal rate.  Respiratory: Effort normal.  Neurological: She is alert and  oriented to person, place, and time.    Review of Systems  Constitutional: Negative.   Psychiatric/Behavioral: Positive for depression (stable on medication).    Blood pressure 103/65, pulse (!) 104, temperature 98.1 F (36.7 C), resp. rate 18, height '5\' 4"'$  (1.626 m), weight 69.4 kg, SpO2 98 %.Body mass index is 26.26 kg/m.  See MD's discharge SRA     Have you used any form of tobacco in the last 30 days? (Cigarettes, Smokeless Tobacco, Cigars, and/or Pipes): No  Has this patient used any form of tobacco in the last 30 days? (Cigarettes, Smokeless Tobacco, Cigars, and/or Pipes)  No  Blood Alcohol level:  Lab Results  Component Value Date   ETH <10 12/13/2018   ETH <10 26/71/2458    Metabolic Disorder Labs:  Lab Results  Component Value Date   HGBA1C 8.2 (H) 12/20/2018   MPG 188.64 12/20/2018   MPG 197.25 12/14/2018   Lab Results  Component Value Date   PROLACTIN 10.6 08/31/2015   Lab Results  Component Value Date   CHOL 196 12/20/2018   TRIG 99 12/20/2018   HDL 48 12/20/2018   CHOLHDL 4.1 12/20/2018   VLDL 20 12/20/2018   LDLCALC 128 (H) 12/20/2018   LDLCALC 73 06/10/2018    See Psychiatric Specialty Exam and Suicide Risk Assessment completed by Attending Physician prior to discharge.  Discharge destination:  Home  Is patient on multiple antipsychotic therapies at discharge:  No   Has Patient had three or more failed trials of antipsychotic monotherapy by history:  No  Recommended Plan for Multiple Antipsychotic Therapies: NA   Allergies as of 12/23/2018      Reactions   Tramadol Nausea Only   "feeling like I'm on fire"   Other Other (See Comments)   Pt reports being allergic to nicotine patches only.  She can use the gum   Nicotine Rash   Patient states she is allergic to the Nicotine patch and they cause her to break out in a rash.  She states she can smoke cigarettes.      Medication List    TAKE these medications     Indication  albuterol 108  (90 Base) MCG/ACT inhaler Commonly known as: VENTOLIN HFA Inhale 2 puffs into the lungs every 6 (six) hours as needed for wheezing or shortness of breath.  Indication: Asthma   benztropine 1 MG tablet Commonly known as: COGENTIN Take 1 tablet (1 mg total) by mouth 2 (two) times daily.  Indication: Extrapyramidal Reaction caused by Medications   blood glucose meter kit and supplies Dispense based on patient and insurance preference. Use up to four times daily as directed. (FOR ICD-10 E10.9, E11.9).  Indication: Deficiency of Folic Acid   hydrOXYzine 25 MG tablet Commonly known as: ATARAX/VISTARIL Take 1 tablet (25 mg total) by mouth 3 (three) times daily as needed for anxiety.  Indication: Feeling Anxious   insulin aspart 100 UNIT/ML injection Commonly known as: novoLOG Inject 0-9 Units into the skin 3 (three) times daily with meals.  Indication: High Blood Sugar   metFORMIN 1000 MG tablet Commonly known as: GLUCOPHAGE Take 0.5 tablets (500 mg total) by mouth 2 (two) times daily.  Indication: Type 2 Diabetes   mirtazapine 15 MG tablet Commonly known as: REMERON Take 1 tablet (15 mg total) by mouth at bedtime.  Indication: Major Depressive Disorder   Pen Needles 3/16" 31G X 5 MM Misc 15 Units by Does not apply route daily.  Indication: Abnormal BUN   risperiDONE 1 MG tablet Commonly known as: RISPERDAL Take 1 tablet (1 mg total) by mouth 2 (two) times daily. What changed: Another medication with the same name was added. Make sure you understand how and when to take each.  Indication: Manic Phase of Manic-Depression   risperiDONE 2 MG tablet Commonly known as: RisperDAL Take 1 tablet (2 mg total) by mouth 2 (two) times daily. What changed: You were already taking a medication with the same name, and this prescription was added. Make sure you understand how and when to take each.  Indication: Delusions   sertraline 100 MG tablet Commonly known as: ZOLOFT Take 1 tablet  (100  mg total) by mouth daily.  Indication: Major Depressive Disorder   tamsulosin 0.4 MG Caps capsule Commonly known as: FLOMAX Take 1 capsule (0.4 mg total) by mouth at bedtime for 7 days.  Indication: Dysfunction of the Urinary Bladder   traZODone 50 MG tablet Commonly known as: DESYREL Take 1 tablet (50 mg total) by mouth at bedtime as needed for sleep.  Indication: Trouble Sleeping      Follow-up Information    [facility] Follow up on 12/24/2018.   Why: Telephonic hospital follow up is Thursday, 7/16 at 10:30a.  The provider will contact you the day of the appointment.   A referral has been made for Transitional Care Team services. They will contact you after you discharge to start services.  Contact information: [address] [place] [address] [phone]           Follow-up recommendations: Activity as tolerated. Diet as recommended by primary care physician. Keep all scheduled follow-up appointments as recommended.   Comments:   Patient is instructed to take all prescribed medications as recommended. Report any side effects or adverse reactions to your outpatient psychiatrist. Patient is instructed to abstain from alcohol and illegal drugs while on prescription medications. In the event of worsening symptoms, patient is instructed to call the crisis hotline, 911, or go to the nearest emergency department for evaluation and treatment.  Signed: Connye Burkitt, NP 12/23/2018, 9:44 AM

## 2018-12-23 NOTE — Plan of Care (Signed)
  Problem: Education: Goal: Knowledge of [facility] Education information/materials will improve Outcome: Adequate for Discharge Goal: Emotional status will improve Outcome: Adequate for Discharge Goal: Mental status will improve Outcome: Adequate for Discharge Goal: Verbalization of understanding the information provided will improve Outcome: Adequate for Discharge   Problem: Coping: Goal: Coping ability will improve Outcome: Adequate for Discharge Goal: Will verbalize feelings Outcome: Adequate for Discharge   Problem: Health Behavior/Discharge Planning: Goal: Identification of resources available to assist in meeting health care needs will improve Outcome: Adequate for Discharge   Problem: Medication: Goal: Compliance with prescribed medication regimen will improve Outcome: Adequate for Discharge   Problem: Self-Concept: Goal: Ability to disclose and discuss suicidal ideas will improve Outcome: Adequate for Discharge Goal: Will verbalize positive feelings about self Outcome: Adequate for Discharge

## 2018-12-23 NOTE — Progress Notes (Signed)
  [facility] Adult Case Management Discharge Plan :  Will you be returning to the same living situation after discharge:  Yes,  home At discharge, do you have transportation home?: Yes,  [employer] at 12:30pm Do you have the ability to pay for your medications: Yes,  medicaid  Release of information consent forms completed and in the chart;  Patient's signature needed at discharge.  Patient to Follow up at: Follow-up Information    [facility] Follow up on 12/24/2018.   Why: Telephonic hospital follow up is Thursday, 7/16 at 10:30a.  The provider will contact you the day of the appointment.   A referral has been made for Transitional Care Team services. They will contact you after you discharge to start services.  Contact information: [address] [place] [address] [phone]           Next level of care provider has access to [facility] and Suicide Prevention discussed: Yes,  pt's daughter-in-law  Have you used any form of tobacco in the last 30 days? (Cigarettes, Smokeless Tobacco, Cigars, and/or Pipes): No  Has patient been referred to the Quitline?: N/A patient is not a smoker  Patient has been referred for addiction treatment: Yes  Trecia Rogers, LCSW 12/23/2018, 10:52 AM

## 2018-12-23 NOTE — Progress Notes (Signed)
Patient ID: [name] [name], female   DOB: Dec 02, 1970, 48 y.o.   MRN: [id number]  CSW obtained consent signature from patient for [facility]. CSW scheduled [employer] for the patient for 12:30pm. Nurse was notified.

## 2019-01-20 ENCOUNTER — Telehealth: Payer: Self-pay

## 2019-01-20 NOTE — Telephone Encounter (Signed)
Pt would like to establish care. Pt was referred to you by Elise Benne. Please advise if you will accept her.

## 2019-04-30 ENCOUNTER — Other Ambulatory Visit: Payer: Self-pay

## 2019-04-30 DIAGNOSIS — Z20822 Contact with and (suspected) exposure to covid-19: Secondary | ICD-10-CM

## 2019-05-03 LAB — NOVEL CORONAVIRUS, NAA: SARS-CoV-2, NAA: NOT DETECTED

## 2020-01-26 MED FILL — oxyCODONE HCL 10 MG TABS: 10 | 30 days supply | Qty: 120 | Fill #0

## 2020-04-25 ENCOUNTER — Emergency Department (HOSPITAL_COMMUNITY)
Admission: EM | Admit: 2020-04-25 | Discharge: 2020-04-25 | Disposition: A | Discharge status: Home / Self Care | Payer: Medicaid Other | Attending: Emergency Medicine | Admitting: Emergency Medicine

## 2020-04-25 ENCOUNTER — Emergency Department (HOSPITAL_COMMUNITY): Payer: Medicaid Other

## 2020-04-25 DIAGNOSIS — M549 Dorsalgia, unspecified: Secondary | ICD-10-CM | POA: Diagnosis not present

## 2020-04-25 DIAGNOSIS — E1169 Type 2 diabetes mellitus with other specified complication: Secondary | ICD-10-CM | POA: Diagnosis not present

## 2020-04-25 DIAGNOSIS — E1165 Type 2 diabetes mellitus with hyperglycemia: Secondary | ICD-10-CM | POA: Diagnosis not present

## 2020-04-25 DIAGNOSIS — E1110 Type 2 diabetes mellitus with ketoacidosis without coma: Secondary | ICD-10-CM | POA: Insufficient documentation

## 2020-04-25 DIAGNOSIS — R739 Hyperglycemia, unspecified: Secondary | ICD-10-CM

## 2020-04-25 DIAGNOSIS — Z79899 Other long term (current) drug therapy: Secondary | ICD-10-CM | POA: Diagnosis not present

## 2020-04-25 DIAGNOSIS — E669 Obesity, unspecified: Secondary | ICD-10-CM | POA: Diagnosis not present

## 2020-04-25 DIAGNOSIS — G8929 Other chronic pain: Secondary | ICD-10-CM | POA: Diagnosis not present

## 2020-04-25 DIAGNOSIS — I10 Essential (primary) hypertension: Secondary | ICD-10-CM | POA: Diagnosis not present

## 2020-04-25 DIAGNOSIS — Z794 Long term (current) use of insulin: Secondary | ICD-10-CM | POA: Insufficient documentation

## 2020-04-25 DIAGNOSIS — M79606 Pain in leg, unspecified: Secondary | ICD-10-CM | POA: Insufficient documentation

## 2020-04-25 DIAGNOSIS — J45909 Unspecified asthma, uncomplicated: Secondary | ICD-10-CM | POA: Insufficient documentation

## 2020-04-25 DIAGNOSIS — Z7984 Long term (current) use of oral hypoglycemic drugs: Secondary | ICD-10-CM | POA: Diagnosis not present

## 2020-04-25 DIAGNOSIS — R0602 Shortness of breath: Secondary | ICD-10-CM | POA: Insufficient documentation

## 2020-04-25 DIAGNOSIS — E860 Dehydration: Secondary | ICD-10-CM | POA: Insufficient documentation

## 2020-04-25 DIAGNOSIS — F17210 Nicotine dependence, cigarettes, uncomplicated: Secondary | ICD-10-CM | POA: Insufficient documentation

## 2020-04-25 DIAGNOSIS — R0789 Other chest pain: Secondary | ICD-10-CM | POA: Diagnosis not present

## 2020-04-25 DIAGNOSIS — K859 Acute pancreatitis without necrosis or infection, unspecified: Secondary | ICD-10-CM | POA: Diagnosis not present

## 2020-04-25 LAB — CBC WITH DIFFERENTIAL/PLATELET
Abs Immature Granulocytes: 0.04 10*3/uL (ref 0.00–0.07)
Basophils Absolute: 0.1 10*3/uL (ref 0.0–0.1)
Basophils Relative: 1 %
Eosinophils Absolute: 0.4 10*3/uL (ref 0.0–0.5)
Eosinophils Relative: 4 %
HCT: 39.4 % (ref 36.0–46.0)
Hemoglobin: 12.8 g/dL (ref 12.0–15.0)
Immature Granulocytes: 0 %
Lymphocytes Relative: 24 %
Lymphs Abs: 2.2 10*3/uL (ref 0.7–4.0)
MCH: 29 pg (ref 26.0–34.0)
MCHC: 32.5 g/dL (ref 30.0–36.0)
MCV: 89.3 fL (ref 80.0–100.0)
Monocytes Absolute: 0.7 10*3/uL (ref 0.1–1.0)
Monocytes Relative: 8 %
Neutro Abs: 5.7 10*3/uL (ref 1.7–7.7)
Neutrophils Relative %: 63 %
Platelets: 225 10*3/uL (ref 150–400)
RBC: 4.41 MIL/uL (ref 3.87–5.11)
RDW: 13.5 % (ref 11.5–15.5)
WBC: 9.1 10*3/uL (ref 4.0–10.5)
nRBC: 0 % (ref 0.0–0.2)

## 2020-04-25 LAB — LIPASE, BLOOD: Lipase: 116 U/L — ABNORMAL HIGH (ref 11–51)

## 2020-04-25 LAB — COMPREHENSIVE METABOLIC PANEL
ALT: 21 U/L (ref 0–44)
AST: 17 U/L (ref 15–41)
Albumin: 4.3 g/dL (ref 3.5–5.0)
Alkaline Phosphatase: 98 U/L (ref 38–126)
Anion gap: 9 (ref 5–15)
BUN: 17 mg/dL (ref 6–20)
CO2: 25 mmol/L (ref 22–32)
Calcium: 8.7 mg/dL — ABNORMAL LOW (ref 8.9–10.3)
Chloride: 102 mmol/L (ref 98–111)
Creatinine, Ser: 1.11 mg/dL — ABNORMAL HIGH (ref 0.44–1.00)
GFR, Estimated: >60 mL/min (ref >60)
Glucose, Bld: 247 mg/dL — ABNORMAL HIGH (ref 70–99)
Potassium: 3.8 mmol/L (ref 3.5–5.1)
Sodium: 136 mmol/L (ref 135–145)
Total Bilirubin: 0.5 mg/dL (ref 0.3–1.2)
Total Protein: 6.9 g/dL (ref 6.5–8.1)

## 2020-04-25 LAB — BLOOD GAS, VENOUS
Acid-base deficit: 0.7 mmol/L (ref 0.0–2.0)
Bicarbonate: 25.3 mmol/L (ref 20.0–28.0)
FIO2: 21
O2 Saturation: 70.6 %
Patient temperature: 98.6
pCO2, Ven: 49.5 mmHg (ref 44.0–60.0)
pH, Ven: 7.328 (ref 7.250–7.430)
pO2, Ven: 41 mmHg (ref 32.0–45.0)

## 2020-04-25 LAB — ETHANOL: Alcohol, Ethyl (B): <10 mg/dL (ref <10)

## 2020-04-25 LAB — TROPONIN I (HIGH SENSITIVITY): Troponin I (High Sensitivity): 7 ng/L (ref <18)

## 2020-04-25 MED ORDER — LACTATED RINGERS IV BOLUS
1000.0000 mL | Freq: Once | INTRAVENOUS | Status: AC
  Administered 2020-04-25: 1000 mL via INTRAVENOUS

## 2020-04-25 MED ORDER — METOCLOPRAMIDE HCL 5 MG/ML IJ SOLN
10.0000 mg | Freq: Once | INTRAMUSCULAR | Status: AC
  Administered 2020-04-25: 10 mg via INTRAVENOUS
  Filled 2020-04-25: qty 2

## 2020-04-25 NOTE — ED Notes (Addendum)
Pt yelling in room. Upon this writers entry to room, pt naked, out of bed. Pt removed all cardiac monitoring and gown removed, moved stretcher across room. Pt asking for belongings back for medications, pt reminded that any medications needed will be administered and verified through hospital system and no need for medications from home at this time due to safety issues if medication is not recorded during hospital stay. Pt getting verbally aggressive with staff, volume increasing stating "Im grown, you arn't my mother, and you don't care" . Pt reminded all staff are here to try to help pt to the best of our ability with safety as top priority. Pt insistent that she does not need to be here and would like to leave AMA. MD at bedside to speak with pt about risks involved with leaving at this time. Pt acknowledged all education and verbalizes understanding, states she is still wanting to leave.

## 2020-04-25 NOTE — Discharge Instructions (Signed)
Try to avoid sugary foods and drinks.  Continue to take your diabetes medication.  Make sure you are drinking plenty of fluids.  If you have any more palpitations or you continue to vomit and cannot hold anything down please return to the emergency room.  It will be important for you to follow-up with your regular doctor for your home medications but also you may need to see a cardiologist to get a monitor placed if you continue to have the palpitations.  Today your labs show that everything with your heart looked good, your x-ray was normal, your blood test did not show any DKA but did show a mild pancreatitis.  That may be from the vomiting you have been experiencing.

## 2020-04-25 NOTE — ED Provider Notes (Signed)
[facility] DEPT Provider Note   CSN: [id number] Arrival date & time: 04/25/20  0100     History Chief Complaint  Patient presents with  . Hyperglycemia    [name] [name] is a 49 y.o. female.  Patient is a 49 year old female with a history of diabetes, prior DKA, bipolar disease, intentional overdose, chronic pain syndrome on daily narcotic medication who presents today with EMS with the complaint of shortness of breath and chest pressure.  Paramedics report that patient was found standing out in the cold complaining of pain in her abdomen and her hip and did not appear to be taking care of herself or mentating normally.  Upon arrival here patient is able to give a history and reports she has not had anything to eat since Sunday because she has had dry heaving and poor appetite.  She reports for the last 2 to 3 days she has had a pressure in her chest, palpitations and shortness of breath.  She reports that she does smoke cigarettes but denies any drug or alcohol use.  She also reports she has had diarrhea.  She denies fever or localized abdominal pain.  She reports she has chronic pain in her back and leg that has been present since her 68s.  She takes oxycodone 10 4 times a day for this and reports she took 3 yesterday and 1 this morning but the pain is worse so she took ibuprofen as well.  She denies any urinary complaints.  She denies headache, confusion but does report that within the last month she was placed on a heart rate medication because her heart rate was elevated the last time she saw her doctor.  She also reported that her sister checked her blood sugar today and it was elevated.  She does take Metformin 2 times a day but denies any other diabetic medications.  Other than the ibuprofen she denies any over-the-counter medication use other than vitamin D and a multivitamin.  The history is provided by the patient, the EMS personnel and medical records.   Hyperglycemia      Past Medical History:  Diagnosis Date  . Anxiety disorder   . Asthma   . Bipolar affective disorder ([place])   . Depression   . Diabetes mellitus   . Migraine   . Schizophrenia [facility])     Patient Active Problem List   Diagnosis Date Noted  . Bipolar 1 disorder ([place]) 12/18/2018  . Suicide attempt ([place])   . Overdose, intentional self-harm, initial encounter ([place]) 12/13/2018  . Bipolar I disorder, single manic episode, severe, with psychosis ([place]) 06/09/2018  . Bipolar disorder ([place]) 06/09/2018  . DKA, type 2 ([place]) 01/06/2018  . Diabetic acidosis without coma ([place])   . Adjustment disorder with depressed mood 01/04/2018  . Oral thrush   . Acute lower UTI   . DKA, type 2, not at goal [facility]) 01/03/2018  . Thrush 01/03/2018  . DM (diabetes mellitus), type 2, uncontrolled ([place]) 06/07/2017  . Abscess of groin, right 06/07/2017  . Abscess 06/07/2017  . Bipolar disorder with moderate depression ([place]) 08/31/2015  . Tobacco use disorder 08/28/2015  . HTN (hypertension) 08/28/2015  . Diabetes ([place]) 08/28/2015  . Hyponatremia 08/28/2015  . T2DM (type 2 diabetes mellitus) ([place]) 10/26/2009  . OBESITY 10/26/2009  . Essential hypertension 10/26/2009  . DISTURBANCE OF SKIN SENSATION 10/26/2009  . Migraine headache 03/05/2007    Past Surgical History:  Procedure Laterality Date  . KNEE ARTHROSCOPY  x 2  . KNEE SURGERY Left    Incision made and knee cleaned out  . TUBAL LIGATION       OB History   No obstetric history on file.     Family History  Problem Relation Age of Onset  . Bipolar disorder Mother   . Hypertension Father   . Diabetes Father     Social History   Tobacco Use  . Smoking status: Current Every Day Smoker    Packs/day: 1.00    Years: 29.00    Pack years: 29.00    Types: Cigarettes  . Smokeless tobacco: Never Used  Vaping Use  . Vaping Use: Former  Substance Use Topics  . Alcohol use: Not Currently    Comment: occasionally  .  Drug use: No    Home Medications Prior to Admission medications   Medication Sig Start Date End Date Taking? Authorizing Provider  albuterol (PROVENTIL HFA;VENTOLIN HFA) 108 (90 Base) MCG/ACT inhaler Inhale 2 puffs into the lungs every 6 (six) hours as needed for wheezing or shortness of breath. Patient not taking: Reported on 12/13/2018 01/06/18   Eugenie Filler, MD  benztropine (COGENTIN) 1 MG tablet Take 1 tablet (1 mg total) by mouth 2 (two) times daily. 12/23/18 12/23/19  Johnn Hai, MD  blood glucose meter kit and supplies Dispense based on patient and insurance preference. Use up to four times daily as directed. (FOR ICD-10 E10.9, E11.9). 01/06/18   Eugenie Filler, MD  hydrOXYzine (ATARAX/VISTARIL) 25 MG tablet Take 1 tablet (25 mg total) by mouth 3 (three) times daily as needed for anxiety. 12/16/18   Antonieta Pert, MD  insulin aspart (NOVOLOG) 100 UNIT/ML injection Inject 0-9 Units into the skin 3 (three) times daily with meals. 12/16/18   Antonieta Pert, MD  Insulin Pen Needle (PEN NEEDLES 3/16") 31G X 5 MM MISC 15 Units by Does not apply route daily. 01/06/18   Eugenie Filler, MD  metFORMIN (GLUCOPHAGE) 1000 MG tablet Take 0.5 tablets (500 mg total) by mouth 2 (two) times daily. 10/08/18   Andee Poles, MD  mirtazapine (REMERON) 15 MG tablet Take 1 tablet (15 mg total) by mouth at bedtime. 12/22/18   Johnn Hai, MD  risperiDONE (RISPERDAL) 1 MG tablet Take 1 tablet (1 mg total) by mouth 2 (two) times daily. 12/16/18   Antonieta Pert, MD  risperiDONE (RISPERDAL) 2 MG tablet Take 1 tablet (2 mg total) by mouth 2 (two) times daily. 12/23/18 12/23/19  Johnn Hai, MD  sertraline (ZOLOFT) 100 MG tablet Take 1 tablet (100 mg total) by mouth daily. 12/23/18   Johnn Hai, MD  traZODone (DESYREL) 50 MG tablet Take 1 tablet (50 mg total) by mouth at bedtime as needed for sleep. 12/16/18   Antonieta Pert, MD    Allergies    Tramadol, Other, and Nicotine  Review of Systems   Review of Systems  All  other systems reviewed and are negative.   Physical Exam Updated Vital Signs BP (!) 124/94 (BP Location: Right Arm)   Pulse 97   Temp 98.9 F (37.2 C) (Oral)   Resp (!) 24   Ht 5' 5" (1.651 m)   Wt 81.6 kg   SpO2 97%   BMI 29.95 kg/m   Physical Exam Vitals and nursing note reviewed.  Constitutional:      General: She is not in acute distress.    Appearance: She is well-developed. She is obese.     Comments: Patient has dried stool that has  run down her legs, generally disheveled  HENT:     Head: Normocephalic and atraumatic.     Mouth/Throat:     Mouth: Mucous membranes are dry.  Eyes:     Extraocular Movements: Extraocular movements intact.     Pupils: Pupils are equal, round, and reactive to light.  Cardiovascular:     Rate and Rhythm: Normal rate and regular rhythm.     Pulses: Normal pulses.     Heart sounds: Normal heart sounds. No murmur heard.  No friction rub.  Pulmonary:     Effort: Pulmonary effort is normal.     Breath sounds: Normal breath sounds. No wheezing or rales.  Chest:     Chest wall: No tenderness.  Abdominal:     General: Bowel sounds are normal. There is no distension.     Palpations: Abdomen is soft.     Tenderness: There is no abdominal tenderness. There is no right CVA tenderness, left CVA tenderness, guarding or rebound.  Musculoskeletal:        General: No tenderness. Normal range of motion.     Right lower leg: No edema.     Left lower leg: No edema.     Comments: No edema  Skin:    General: Skin is warm and dry.     Findings: No rash.  Neurological:     Mental Status: She is alert.     Cranial Nerves: No cranial nerve deficit.     Sensory: No sensory deficit.     Motor: No weakness.     Gait: Gait normal.     Comments: Oriented to person and place but confused on time  Psychiatric:     Comments: Calm and cooperative.  Does not seem to be responding to internal stimuli     ED Results / Procedures / Treatments   Labs (all  labs ordered are listed, but only abnormal results are displayed) Labs Reviewed  COMPREHENSIVE METABOLIC PANEL - Abnormal; Notable for the following components:      Result Value   Glucose, Bld 247 (*)    Creatinine, Ser 1.11 (*)    Calcium 8.7 (*)    All other components within normal limits  LIPASE, BLOOD - Abnormal; Notable for the following components:   Lipase 116 (*)    All other components within normal limits  RESPIRATORY PANEL BY RT PCR (FLU A&B, COVID)  CBC WITH DIFFERENTIAL/PLATELET  ETHANOL  BLOOD GAS, VENOUS  URINALYSIS, ROUTINE W REFLEX MICROSCOPIC  TROPONIN I (HIGH SENSITIVITY)  TROPONIN I (HIGH SENSITIVITY)    EKG None  ED ECG REPORT   Date: 04/25/2020  Rate: 94  Rhythm: normal sinus rhythm  QRS Axis: normal  Intervals: QT prolonged  ST/T Wave abnormalities: normal  Conduction Disutrbances:none  Narrative Interpretation:   Old EKG Reviewed: unchanged  I have personally reviewed the EKG tracing and agree with the computerized printout as noted.   Radiology DG Chest Port 1 View  Result Date: 04/25/2020 CLINICAL DATA:  Chest pressure EXAM: PORTABLE CHEST 1 VIEW COMPARISON:  10/08/2018 FINDINGS: The heart size and mediastinal contours are within normal limits. No focal airspace consolidation, pleural effusion, or pneumothorax. The visualized skeletal structures are unremarkable. IMPRESSION: No active disease. Electronically Signed   By: Davina Poke D.O.   On: 04/25/2020 07:58    Procedures Procedures (including critical care time)  Medications Ordered in ED Medications  lactated ringers bolus 1,000 mL (has no administration in time range)    ED Course  I have reviewed the triage vital signs and the nursing notes.  Pertinent labs & imaging results that were available during my care of the patient were reviewed by me and considered in my medical decision making (see chart for details).    MDM Rules/Calculators/A&P                           Patient presenting today with complaints of chest heaviness and shortness of breath.  It seems that this is been present for the last 2 days per patient.  She has also had dry heaving and diarrhea.  Patient is diabetic and blood sugar prior to arrival was 433.  She reports poor appetite over the last few days due to the inability to keep anything down but she has been able to keep down her pain medication.  Patient does have history of mental illness but does not seem to be responding to internal stimuli at this time and is able to get up a fairly concise history.  She denies any excessive intake of over-the-counter medications.  She reports that she is taking her medications as prescribed.  Patient's vital signs are reassuring here she has no localized pain except in her hip and back which appears to be chronic.  There does not appear to be any new signs of external trauma such as ecchymosis.  Patient is ambulating around the room without difficulty.  Possibility for diabetic complication such as DKA versus acute cardiac issue.  Patient is not febrile and low suspicion for sepsis at this time.  No unilateral leg swelling and no prior history of DVT or PE with lower suspicion.  Patient given IV fluids and nausea medication.  Labs, ekg and imaging are pending.  9:54 AM Chest x-ray is within normal limits, EKG without acute findings, VBG within normal limits, CMP with mild AKI with creatinine of 1.1 from baseline of 0.8, blood sugar of 247 and normal anion gap of 9, CBC within normal limits, lipase mildly elevated at 116, alcohol is less than 10 and troponin is within normal limits.  Nurse witnessed patient getting into her home bag of medication and she took Ambien while in the room.  Patient's belongings were removed so that her intake could be monitored as she was also drinking Coke and eating chocolate bars.  When patient's belongings were removed she became upset and reported that she wanted to go home.   Patient appears to be able to make her own decisions.  It was discussed with her that her lipase was elevated but the rest of her labs are reassuring.  Offered to give patient IV fluids nausea medication and she reports that she has been drinking plenty of fluids and she feels fine.  She reports that she is ready to go home and she wants to follow-up with her doctor as an outpatient.  Patient was encouraged to see her regular doctor and may even benefit from [facility] patch monitoring if she continues to have palpitations but has no evidence of dysrhythmia today.  Patient is insistent that she leave now and she was given her belongings. MDM Number of Diagnoses or Management Options   Amount and/or Complexity of Data Reviewed Clinical lab tests: ordered and reviewed Tests in the radiology section of CPT: ordered and reviewed Tests in the medicine section of CPT: ordered and reviewed Decide to obtain previous medical records or to obtain history from someone other than the patient: yes Review  and summarize past medical records: yes Independent visualization of images, tracings, or specimens: yes  Risk of Complications, Morbidity, and/or Mortality Presenting problems: moderate Diagnostic procedures: low Management options: low  Patient Progress Patient progress: stable     Final Clinical Impression(s) / ED Diagnoses Final diagnoses:  Hyperglycemia  Dehydration  Acute pancreatitis, unspecified complication status, unspecified pancreatitis type    Rx / DC Orders ED Discharge Orders    None       Blanchie Dessert, MD 04/25/20 [phone]

## 2020-04-25 NOTE — ED Notes (Signed)
Pt found drinking coca-cola in room. This RN made pt aware that she was not allowed to have soda at this time. Pt also stating that she took Ambien from her purse and has zofran that she will take when she feels she needs it. This RN told pt she was not allowed to do that. Pt states she is "a grown woman" and "can do what she wants." This RN took pt belongings and placed in cabinets 16-18. (3 labeled bags) Anitra Lauth, MD made aware.

## 2020-04-25 NOTE — ED Triage Notes (Signed)
Brought in via EMS from home, found standing out in the cold. Pt complaining of trouble breathing, left hip pain, abdominal pain. Hyperglycemic at 433. Pt says she is not taking insulin at home. EMS reports that she is not mentating appropriately, talking to self, easily startled.

## 2020-06-03 ENCOUNTER — Other Ambulatory Visit: Payer: Self-pay

## 2020-06-03 ENCOUNTER — Encounter (HOSPITAL_COMMUNITY): Payer: Self-pay | Admitting: Emergency Medicine

## 2020-06-03 ENCOUNTER — Emergency Department (HOSPITAL_COMMUNITY)
Admission: EM | Admit: 2020-06-03 | Discharge: 2020-06-05 | Disposition: A | Discharge status: Home / Self Care | Payer: Medicaid Other | Attending: Emergency Medicine | Admitting: Emergency Medicine

## 2020-06-03 DIAGNOSIS — S0083XA Contusion of other part of head, initial encounter: Secondary | ICD-10-CM

## 2020-06-03 DIAGNOSIS — Z20822 Contact with and (suspected) exposure to covid-19: Secondary | ICD-10-CM | POA: Insufficient documentation

## 2020-06-03 DIAGNOSIS — F315 Bipolar disorder, current episode depressed, severe, with psychotic features: Secondary | ICD-10-CM | POA: Insufficient documentation

## 2020-06-03 DIAGNOSIS — Z5900 Homelessness unspecified: Secondary | ICD-10-CM | POA: Diagnosis not present

## 2020-06-03 DIAGNOSIS — Z79899 Other long term (current) drug therapy: Secondary | ICD-10-CM | POA: Insufficient documentation

## 2020-06-03 DIAGNOSIS — I10 Essential (primary) hypertension: Secondary | ICD-10-CM | POA: Insufficient documentation

## 2020-06-03 DIAGNOSIS — F319 Bipolar disorder, unspecified: Secondary | ICD-10-CM | POA: Diagnosis present

## 2020-06-03 DIAGNOSIS — F17210 Nicotine dependence, cigarettes, uncomplicated: Secondary | ICD-10-CM | POA: Diagnosis not present

## 2020-06-03 DIAGNOSIS — T7411XA Adult physical abuse, confirmed, initial encounter: Secondary | ICD-10-CM | POA: Insufficient documentation

## 2020-06-03 DIAGNOSIS — E1110 Type 2 diabetes mellitus with ketoacidosis without coma: Secondary | ICD-10-CM | POA: Diagnosis not present

## 2020-06-03 DIAGNOSIS — G43909 Migraine, unspecified, not intractable, without status migrainosus: Secondary | ICD-10-CM | POA: Insufficient documentation

## 2020-06-03 DIAGNOSIS — R462 Strange and inexplicable behavior: Secondary | ICD-10-CM | POA: Diagnosis present

## 2020-06-03 DIAGNOSIS — Z794 Long term (current) use of insulin: Secondary | ICD-10-CM | POA: Insufficient documentation

## 2020-06-03 DIAGNOSIS — F3130 Bipolar disorder, current episode depressed, mild or moderate severity, unspecified: Secondary | ICD-10-CM | POA: Diagnosis present

## 2020-06-03 DIAGNOSIS — Z7984 Long term (current) use of oral hypoglycemic drugs: Secondary | ICD-10-CM | POA: Insufficient documentation

## 2020-06-03 DIAGNOSIS — J45909 Unspecified asthma, uncomplicated: Secondary | ICD-10-CM | POA: Diagnosis not present

## 2020-06-03 DIAGNOSIS — F3110 Bipolar disorder, current episode manic without psychotic features, unspecified: Secondary | ICD-10-CM | POA: Diagnosis not present

## 2020-06-03 DIAGNOSIS — F309 Manic episode, unspecified: Secondary | ICD-10-CM

## 2020-06-03 LAB — CBC WITH DIFFERENTIAL/PLATELET
Abs Immature Granulocytes: 0.02 10*3/uL (ref 0.00–0.07)
Basophils Absolute: 0.1 10*3/uL (ref 0.0–0.1)
Basophils Relative: 1 %
Eosinophils Absolute: 0.2 10*3/uL (ref 0.0–0.5)
Eosinophils Relative: 3 %
HCT: 37.8 % (ref 36.0–46.0)
Hemoglobin: 12 g/dL (ref 12.0–15.0)
Immature Granulocytes: 0 %
Lymphocytes Relative: 32 %
Lymphs Abs: 2.9 10*3/uL (ref 0.7–4.0)
MCH: 29.1 pg (ref 26.0–34.0)
MCHC: 31.7 g/dL (ref 30.0–36.0)
MCV: 91.5 fL (ref 80.0–100.0)
Monocytes Absolute: 0.6 10*3/uL (ref 0.1–1.0)
Monocytes Relative: 7 %
Neutro Abs: 5.1 10*3/uL (ref 1.7–7.7)
Neutrophils Relative %: 57 %
Platelets: 221 10*3/uL (ref 150–400)
RBC: 4.13 MIL/uL (ref 3.87–5.11)
RDW: 13.6 % (ref 11.5–15.5)
WBC: 8.9 10*3/uL (ref 4.0–10.5)
nRBC: 0 % (ref 0.0–0.2)

## 2020-06-03 LAB — COMPREHENSIVE METABOLIC PANEL
ALT: 18 U/L (ref 0–44)
AST: 15 U/L (ref 15–41)
Albumin: 3.3 g/dL — ABNORMAL LOW (ref 3.5–5.0)
Alkaline Phosphatase: 74 U/L (ref 38–126)
Anion gap: 9 (ref 5–15)
BUN: 14 mg/dL (ref 6–20)
CO2: 24 mmol/L (ref 22–32)
Calcium: 8.7 mg/dL — ABNORMAL LOW (ref 8.9–10.3)
Chloride: 107 mmol/L (ref 98–111)
Creatinine, Ser: 0.79 mg/dL (ref 0.44–1.00)
GFR, Estimated: >60 mL/min (ref >60)
Glucose, Bld: 134 mg/dL — ABNORMAL HIGH (ref 70–99)
Potassium: 4.1 mmol/L (ref 3.5–5.1)
Sodium: 140 mmol/L (ref 135–145)
Total Bilirubin: 0.3 mg/dL (ref 0.3–1.2)
Total Protein: 5.9 g/dL — ABNORMAL LOW (ref 6.5–8.1)

## 2020-06-03 LAB — ETHANOL: Alcohol, Ethyl (B): <10 mg/dL (ref <10)

## 2020-06-03 LAB — RESP PANEL BY RT-PCR (FLU A&B, COVID) ARPGX2
Influenza A by PCR: NEGATIVE
Influenza B by PCR: NEGATIVE
SARS Coronavirus 2 by RT PCR: NEGATIVE

## 2020-06-03 MED ORDER — KETOROLAC TROMETHAMINE 60 MG/2ML IM SOLN
60.0000 mg | Freq: Once | INTRAMUSCULAR | Status: AC
  Administered 2020-06-03: 22:00:00 60 mg via INTRAMUSCULAR
  Filled 2020-06-03: qty 2

## 2020-06-03 MED ORDER — LORAZEPAM 2 MG/ML IJ SOLN
1.0000 mg | Freq: Once | INTRAMUSCULAR | Status: AC
  Administered 2020-06-03: 16:00:00 1 mg via INTRAMUSCULAR
  Filled 2020-06-03: qty 1

## 2020-06-03 MED ORDER — HALOPERIDOL LACTATE 5 MG/ML IJ SOLN
5.0000 mg | Freq: Four times a day (QID) | INTRAMUSCULAR | Status: DC | PRN
  Administered 2020-06-03 – 2020-06-05 (×3): 5 mg via INTRAMUSCULAR
  Filled 2020-06-03 (×3): qty 1

## 2020-06-03 NOTE — ED Notes (Signed)
Tried to draw blood x1 left hand without success.  Called phlebotomy.
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
## 2020-06-03 NOTE — ED Notes (Signed)
Pt is not giving urine. On the 2nd attempt she pulled her name label off of the urine cup and left it in the bathroom.

## 2020-06-04 ENCOUNTER — Emergency Department (HOSPITAL_COMMUNITY): Payer: Medicaid Other

## 2020-06-04 LAB — PREGNANCY, URINE: Preg Test, Ur: NEGATIVE

## 2020-06-04 LAB — RAPID URINE DRUG SCREEN, HOSP PERFORMED
Amphetamines: NOT DETECTED
Barbiturates: NOT DETECTED
Benzodiazepines: NOT DETECTED
Cocaine: NOT DETECTED
Opiates: NOT DETECTED
Tetrahydrocannabinol: NOT DETECTED

## 2020-06-04 MED ORDER — SERTRALINE HCL 50 MG PO TABS
150.0000 mg | ORAL_TABLET | Freq: Every day | ORAL | Status: DC
Start: 2020-06-04 — End: 2020-06-05
  Administered 2020-06-04 – 2020-06-05 (×2): 150 mg via ORAL
  Filled 2020-06-04 (×2): qty 3

## 2020-06-04 MED ORDER — METFORMIN HCL 500 MG PO TABS
1000.0000 mg | ORAL_TABLET | Freq: Two times a day (BID) | ORAL | Status: DC
  Administered 2020-06-04 – 2020-06-05 (×3): 1000 mg via ORAL
  Filled 2020-06-04 (×3): qty 2

## 2020-06-04 MED ORDER — BENZTROPINE MESYLATE 1 MG PO TABS
1.0000 mg | ORAL_TABLET | Freq: Every day | ORAL | Status: DC
  Administered 2020-06-04 – 2020-06-05 (×2): 1 mg via ORAL
  Filled 2020-06-04 (×2): qty 1

## 2020-06-04 MED ORDER — HYDROXYZINE HCL 25 MG PO TABS
25.0000 mg | ORAL_TABLET | Freq: Three times a day (TID) | ORAL | Status: DC | PRN
  Administered 2020-06-04 (×2): 25 mg via ORAL
  Filled 2020-06-04 (×3): qty 1

## 2020-06-04 MED ORDER — METOPROLOL SUCCINATE ER 25 MG PO TB24
12.5000 mg | ORAL_TABLET | Freq: Every day | ORAL | Status: DC
  Administered 2020-06-04 – 2020-06-05 (×2): 12.5 mg via ORAL
  Filled 2020-06-04 (×2): qty 0.5

## 2020-06-04 MED ORDER — ALBUTEROL SULFATE HFA 108 (90 BASE) MCG/ACT IN AERS
2.0000 | INHALATION_SPRAY | Freq: Four times a day (QID) | RESPIRATORY_TRACT | Status: DC | PRN
  Administered 2020-06-05: 10:00:00 2 via RESPIRATORY_TRACT
  Filled 2020-06-04: qty 6.7

## 2020-06-04 MED ORDER — ACETAMINOPHEN 325 MG PO TABS
650.0000 mg | ORAL_TABLET | ORAL | Status: DC | PRN
  Administered 2020-06-04 – 2020-06-05 (×3): 650 mg via ORAL
  Filled 2020-06-04 (×3): qty 2

## 2020-06-04 MED ORDER — TRAZODONE HCL 100 MG PO TABS
50.0000 mg | ORAL_TABLET | Freq: Every evening | ORAL | Status: DC | PRN
  Administered 2020-06-04: 02:00:00 50 mg via ORAL
  Filled 2020-06-04: qty 1

## 2020-06-04 MED ORDER — IBUPROFEN 200 MG PO TABS
600.0000 mg | ORAL_TABLET | Freq: Once | ORAL | Status: DC
  Filled 2020-06-04: qty 3

## 2020-06-04 MED ORDER — RISPERIDONE 2 MG PO TABS
3.0000 mg | ORAL_TABLET | Freq: Two times a day (BID) | ORAL | Status: DC
  Administered 2020-06-04 – 2020-06-05 (×3): 3 mg via ORAL
  Filled 2020-06-04 (×3): qty 1

## 2020-06-04 MED ORDER — METFORMIN HCL 500 MG PO TABS: 1000.0000 mg | ORAL_TABLET | Freq: Two times a day (BID) | ORAL | Status: DC

## 2020-06-04 NOTE — ED Notes (Signed)
Patient resting quietly.  No complaints at this time.
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
## 2020-06-04 NOTE — Consult Note (Signed)
Recommended for psychiatric inpatient hospitalization for stabilization.  Nanine Means, PMHNP
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
## 2020-06-04 NOTE — ED Notes (Signed)
Pt ambulatory to restroom

## 2020-06-04 NOTE — ED Notes (Signed)
Patient walking up and down hall, refusing to go into her room after repeatedly being asked to return to her room . Patient went into bathroom and got a pad from drawer and put it inside her mask. Security called for support

## 2020-06-05 ENCOUNTER — Encounter (HOSPITAL_COMMUNITY): Payer: Self-pay | Admitting: Registered Nurse

## 2020-06-05 ENCOUNTER — Other Ambulatory Visit: Payer: Self-pay

## 2020-06-05 ENCOUNTER — Encounter (HOSPITAL_COMMUNITY): Payer: Self-pay | Admitting: Obstetrics and Gynecology

## 2020-06-05 DIAGNOSIS — Z7984 Long term (current) use of oral hypoglycemic drugs: Secondary | ICD-10-CM | POA: Insufficient documentation

## 2020-06-05 DIAGNOSIS — T7411XA Adult physical abuse, confirmed, initial encounter: Secondary | ICD-10-CM | POA: Insufficient documentation

## 2020-06-05 DIAGNOSIS — Z7951 Long term (current) use of inhaled steroids: Secondary | ICD-10-CM | POA: Insufficient documentation

## 2020-06-05 DIAGNOSIS — I10 Essential (primary) hypertension: Secondary | ICD-10-CM | POA: Insufficient documentation

## 2020-06-05 DIAGNOSIS — Z79899 Other long term (current) drug therapy: Secondary | ICD-10-CM | POA: Insufficient documentation

## 2020-06-05 DIAGNOSIS — E1110 Type 2 diabetes mellitus with ketoacidosis without coma: Secondary | ICD-10-CM | POA: Insufficient documentation

## 2020-06-05 DIAGNOSIS — Z5900 Homelessness unspecified: Secondary | ICD-10-CM | POA: Insufficient documentation

## 2020-06-05 DIAGNOSIS — F17210 Nicotine dependence, cigarettes, uncomplicated: Secondary | ICD-10-CM | POA: Insufficient documentation

## 2020-06-05 DIAGNOSIS — F319 Bipolar disorder, unspecified: Secondary | ICD-10-CM

## 2020-06-05 DIAGNOSIS — Z794 Long term (current) use of insulin: Secondary | ICD-10-CM | POA: Insufficient documentation

## 2020-06-05 DIAGNOSIS — F3110 Bipolar disorder, current episode manic without psychotic features, unspecified: Secondary | ICD-10-CM

## 2020-06-05 DIAGNOSIS — J45909 Unspecified asthma, uncomplicated: Secondary | ICD-10-CM | POA: Insufficient documentation

## 2020-06-05 NOTE — ED Notes (Signed)
Belongings given back to pt for discharge.

## 2020-06-05 NOTE — ED Notes (Signed)
Patient rocking back and forth in bed. When walk in room patient states she needs to walk hallways. Haldol adminstered

## 2020-06-05 NOTE — ED Notes (Signed)
Pt ambulatory to restroom

## 2020-06-05 NOTE — ED Notes (Signed)
TTS machine at bedside. 

## 2020-06-05 NOTE — ED Notes (Signed)
Patient in bed resting at this time.
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
## 2020-06-05 NOTE — ED Notes (Signed)
Patient resting in bed at this time.

## 2020-06-05 NOTE — Discharge Instructions (Addendum)
For your behavioral health needs, you are advised to continue treatment with your current outpatient provider.  If you do not currently have a provider, contact [facility]:       [facility]      [address]      [place], [place] [address]      [phone]      They offer psychiatry/medication management and therapy.  New patients are being seen in their walk-in clinic.  Walk-in hours are Monday - Thursday from 8:00 am - 11:00 am for psychiatry, and Friday from 1:00 pm - 4:00 pm for therapy.  Walk-in patients are seen on a first come, first served basis, so try to arrive as early as possible for the best chance of being seen the same day.
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
## 2020-06-05 NOTE — BH Assessment (Addendum)
[facility] Assessment Progress Note  ERROR.  Note entered for wrong patient.  Doylene Canning, [place] [facility] Coordinator [phone]
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
## 2020-06-06 ENCOUNTER — Encounter (HOSPITAL_COMMUNITY): Payer: Self-pay

## 2020-06-06 ENCOUNTER — Emergency Department (HOSPITAL_COMMUNITY)
Admission: EM | Admit: 2020-06-06 | Discharge: 2020-06-06 | Disposition: A | Discharge status: Home / Self Care | Payer: Medicaid Other | Source: Home / Self Care | Attending: Emergency Medicine | Admitting: Emergency Medicine

## 2020-06-06 ENCOUNTER — Other Ambulatory Visit: Payer: Self-pay

## 2020-06-06 ENCOUNTER — Emergency Department (HOSPITAL_COMMUNITY)
Admission: EM | Admit: 2020-06-06 | Discharge: 2020-06-06 | Disposition: A | Discharge status: Home / Self Care | Payer: Medicaid Other | Attending: Emergency Medicine | Admitting: Emergency Medicine

## 2020-06-06 DIAGNOSIS — Z5900 Homelessness unspecified: Secondary | ICD-10-CM

## 2020-06-06 DIAGNOSIS — Z7984 Long term (current) use of oral hypoglycemic drugs: Secondary | ICD-10-CM | POA: Insufficient documentation

## 2020-06-06 DIAGNOSIS — E119 Type 2 diabetes mellitus without complications: Secondary | ICD-10-CM | POA: Insufficient documentation

## 2020-06-06 DIAGNOSIS — Z79899 Other long term (current) drug therapy: Secondary | ICD-10-CM | POA: Insufficient documentation

## 2020-06-06 DIAGNOSIS — R110 Nausea: Secondary | ICD-10-CM | POA: Insufficient documentation

## 2020-06-06 DIAGNOSIS — I10 Essential (primary) hypertension: Secondary | ICD-10-CM | POA: Diagnosis not present

## 2020-06-06 DIAGNOSIS — F17210 Nicotine dependence, cigarettes, uncomplicated: Secondary | ICD-10-CM | POA: Insufficient documentation

## 2020-06-06 DIAGNOSIS — R461 Bizarre personal appearance: Secondary | ICD-10-CM | POA: Insufficient documentation

## 2020-06-06 DIAGNOSIS — Z794 Long term (current) use of insulin: Secondary | ICD-10-CM | POA: Insufficient documentation

## 2020-06-06 DIAGNOSIS — J45909 Unspecified asthma, uncomplicated: Secondary | ICD-10-CM | POA: Insufficient documentation

## 2020-06-06 DIAGNOSIS — R638 Other symptoms and signs concerning food and fluid intake: Secondary | ICD-10-CM | POA: Insufficient documentation

## 2020-06-06 DIAGNOSIS — R519 Headache, unspecified: Secondary | ICD-10-CM | POA: Insufficient documentation

## 2020-06-06 LAB — CBG MONITORING, ED: Glucose-Capillary: 320 mg/dL — ABNORMAL HIGH (ref 70–99)

## 2020-06-06 MED ORDER — KETOROLAC TROMETHAMINE 30 MG/ML IJ SOLN
30.0000 mg | Freq: Once | INTRAMUSCULAR | Status: AC
  Administered 2020-06-06: 18:00:00 30 mg via INTRAMUSCULAR
  Filled 2020-06-06: qty 1

## 2020-06-06 MED ORDER — INSULIN ASPART 100 UNIT/ML ~~LOC~~ SOLN
10.0000 [IU] | Freq: Once | SUBCUTANEOUS | Status: AC
  Administered 2020-06-06: 18:00:00 10 [IU] via SUBCUTANEOUS
  Filled 2020-06-06: qty 0.1

## 2020-06-06 MED ORDER — METOPROLOL TARTRATE 25 MG PO TABS
25.0000 mg | ORAL_TABLET | Freq: Once | ORAL | Status: AC
  Administered 2020-06-06: 18:00:00 25 mg via ORAL
  Filled 2020-06-06: qty 1

## 2020-06-06 MED ORDER — METFORMIN HCL 500 MG PO TABS: 500.0000 mg | ORAL_TABLET | Freq: Once | ORAL | Status: DC

## 2020-06-06 MED ORDER — SERTRALINE HCL 50 MG PO TABS
100.0000 mg | ORAL_TABLET | Freq: Every day | ORAL | Status: DC
  Administered 2020-06-06: 18:00:00 100 mg via ORAL
  Filled 2020-06-06: qty 2

## 2020-06-06 NOTE — ED Notes (Signed)
Pt told screeners that she was leaving.

## 2020-06-06 NOTE — Progress Notes (Signed)
TOC CM/CSW went to consult with pt.  Pt has left before CSW could consult.  Luther Newhouse Tarpley-Carter, MSW, LCSW-A Pronouns:  She, Her, Hers                  [facility] Transitions of CareClinical Social Worker [email or web address] [phone]

## 2020-06-06 NOTE — Discharge Instructions (Addendum)
As discussed, your glucose was mildly elevated here in the ER.  You were given insulin.  You are also given your other home medications.  You may take over-the-counter ibuprofen or Tylenol as needed for your headache.  Please follow-up with PCP if symptoms not improved in the next week.  Return to the ER for new or worsening symptoms.
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
## 2020-06-06 NOTE — ED Notes (Signed)
Patient requested a blood sugar check. CBG-320. Patient was drinking a soft drink that was not sugar free.

## 2020-06-07 ENCOUNTER — Ambulatory Visit (HOSPITAL_COMMUNITY)
Admission: EM | Admit: 2020-06-07 | Discharge: 2020-06-07 | Disposition: A | Discharge status: Home / Self Care | Payer: Medicaid Other | Attending: Psychiatry | Admitting: Psychiatry

## 2020-06-07 ENCOUNTER — Other Ambulatory Visit: Payer: Self-pay

## 2020-06-07 ENCOUNTER — Emergency Department (HOSPITAL_COMMUNITY)
Admission: EM | Admit: 2020-06-07 | Discharge: 2020-06-07 | Disposition: A | Discharge status: Home / Self Care | Payer: Medicaid Other | Source: Home / Self Care | Attending: Emergency Medicine | Admitting: Emergency Medicine

## 2020-06-07 DIAGNOSIS — Z5900 Homelessness unspecified: Secondary | ICD-10-CM

## 2020-06-07 DIAGNOSIS — F319 Bipolar disorder, unspecified: Secondary | ICD-10-CM | POA: Insufficient documentation

## 2020-06-07 DIAGNOSIS — F39 Unspecified mood [affective] disorder: Secondary | ICD-10-CM | POA: Insufficient documentation

## 2020-06-07 LAB — CBG MONITORING, ED: Glucose-Capillary: 166 mg/dL — ABNORMAL HIGH (ref 70–99)
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
## 2020-06-07 NOTE — Discharge Instructions (Signed)
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
## 2020-06-07 NOTE — ED Notes (Signed)
Pt given multiple sticks of cheddar cheese, milk, crackers, ice, and juice. Pt not pleased with orders. Pt storms out saying she will just check back in.
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
## 2020-06-07 NOTE — Discharge Instructions (Signed)
You can try to seek housing at one of the shelters.

## 2020-06-11 ENCOUNTER — Encounter (HOSPITAL_COMMUNITY): Payer: Self-pay | Admitting: Emergency Medicine

## 2020-06-11 ENCOUNTER — Emergency Department (HOSPITAL_COMMUNITY)
Admission: EM | Admit: 2020-06-11 | Discharge: 2020-06-12 | Disposition: A | Discharge status: Home / Self Care | Payer: Medicaid Other | Attending: Emergency Medicine | Admitting: Emergency Medicine

## 2020-06-11 ENCOUNTER — Other Ambulatory Visit: Payer: Self-pay

## 2020-06-11 DIAGNOSIS — R456 Violent behavior: Secondary | ICD-10-CM | POA: Insufficient documentation

## 2020-06-11 DIAGNOSIS — Y909 Presence of alcohol in blood, level not specified: Secondary | ICD-10-CM | POA: Diagnosis not present

## 2020-06-11 DIAGNOSIS — Z794 Long term (current) use of insulin: Secondary | ICD-10-CM | POA: Insufficient documentation

## 2020-06-11 DIAGNOSIS — F17210 Nicotine dependence, cigarettes, uncomplicated: Secondary | ICD-10-CM | POA: Diagnosis not present

## 2020-06-11 DIAGNOSIS — F209 Schizophrenia, unspecified: Secondary | ICD-10-CM | POA: Diagnosis not present

## 2020-06-11 DIAGNOSIS — F3130 Bipolar disorder, current episode depressed, mild or moderate severity, unspecified: Secondary | ICD-10-CM | POA: Diagnosis not present

## 2020-06-11 DIAGNOSIS — Z20822 Contact with and (suspected) exposure to covid-19: Secondary | ICD-10-CM | POA: Diagnosis not present

## 2020-06-11 DIAGNOSIS — I10 Essential (primary) hypertension: Secondary | ICD-10-CM | POA: Insufficient documentation

## 2020-06-11 DIAGNOSIS — Z046 Encounter for general psychiatric examination, requested by authority: Secondary | ICD-10-CM | POA: Diagnosis present

## 2020-06-11 DIAGNOSIS — Z79899 Other long term (current) drug therapy: Secondary | ICD-10-CM | POA: Insufficient documentation

## 2020-06-11 DIAGNOSIS — F319 Bipolar disorder, unspecified: Secondary | ICD-10-CM | POA: Diagnosis present

## 2020-06-11 DIAGNOSIS — F314 Bipolar disorder, current episode depressed, severe, without psychotic features: Secondary | ICD-10-CM | POA: Diagnosis present

## 2020-06-11 DIAGNOSIS — Z5900 Homelessness unspecified: Secondary | ICD-10-CM

## 2020-06-11 DIAGNOSIS — E119 Type 2 diabetes mellitus without complications: Secondary | ICD-10-CM | POA: Diagnosis not present

## 2020-06-11 DIAGNOSIS — Z7984 Long term (current) use of oral hypoglycemic drugs: Secondary | ICD-10-CM | POA: Insufficient documentation

## 2020-06-11 DIAGNOSIS — F3132 Bipolar disorder, current episode depressed, moderate: Secondary | ICD-10-CM | POA: Diagnosis present

## 2020-06-11 DIAGNOSIS — R4689 Other symptoms and signs involving appearance and behavior: Secondary | ICD-10-CM

## 2020-06-11 DIAGNOSIS — J45909 Unspecified asthma, uncomplicated: Secondary | ICD-10-CM | POA: Diagnosis not present

## 2020-06-11 LAB — COMPREHENSIVE METABOLIC PANEL
ALT: 17 U/L (ref 0–44)
AST: 17 U/L (ref 15–41)
Albumin: 3.8 g/dL (ref 3.5–5.0)
Alkaline Phosphatase: 83 U/L (ref 38–126)
Anion gap: 9 (ref 5–15)
BUN: 9 mg/dL (ref 6–20)
CO2: 24 mmol/L (ref 22–32)
Calcium: 9 mg/dL (ref 8.9–10.3)
Chloride: 105 mmol/L (ref 98–111)
Creatinine, Ser: 0.8 mg/dL (ref 0.44–1.00)
GFR, Estimated: >60 mL/min (ref >60)
Glucose, Bld: 145 mg/dL — ABNORMAL HIGH (ref 70–99)
Potassium: 4.2 mmol/L (ref 3.5–5.1)
Sodium: 138 mmol/L (ref 135–145)
Total Bilirubin: 0.5 mg/dL (ref 0.3–1.2)
Total Protein: 6.7 g/dL (ref 6.5–8.1)

## 2020-06-11 LAB — CBC WITH DIFFERENTIAL/PLATELET
Abs Immature Granulocytes: 0.03 10*3/uL (ref 0.00–0.07)
Basophils Absolute: 0 10*3/uL (ref 0.0–0.1)
Basophils Relative: 1 %
Eosinophils Absolute: 0.2 10*3/uL (ref 0.0–0.5)
Eosinophils Relative: 3 %
HCT: 39.3 % (ref 36.0–46.0)
Hemoglobin: 12.8 g/dL (ref 12.0–15.0)
Immature Granulocytes: 0 %
Lymphocytes Relative: 31 %
Lymphs Abs: 2.4 10*3/uL (ref 0.7–4.0)
MCH: 29 pg (ref 26.0–34.0)
MCHC: 32.6 g/dL (ref 30.0–36.0)
MCV: 89.1 fL (ref 80.0–100.0)
Monocytes Absolute: 0.7 10*3/uL (ref 0.1–1.0)
Monocytes Relative: 10 %
Neutro Abs: 4.3 10*3/uL (ref 1.7–7.7)
Neutrophils Relative %: 55 %
Platelets: 259 10*3/uL (ref 150–400)
RBC: 4.41 MIL/uL (ref 3.87–5.11)
RDW: 13.9 % (ref 11.5–15.5)
WBC: 7.7 10*3/uL (ref 4.0–10.5)
nRBC: 0 % (ref 0.0–0.2)

## 2020-06-11 LAB — I-STAT BETA HCG BLOOD, ED (MC, WL, AP ONLY): I-stat hCG, quantitative: <5 m[IU]/mL (ref <5)

## 2020-06-11 LAB — ETHANOL: Alcohol, Ethyl (B): <10 mg/dL (ref <10)

## 2020-06-11 LAB — ACETAMINOPHEN LEVEL: Acetaminophen (Tylenol), Serum: <10 ug/mL — ABNORMAL LOW (ref 10–30)

## 2020-06-11 LAB — CBG MONITORING, ED: Glucose-Capillary: 191 mg/dL — ABNORMAL HIGH (ref 70–99)

## 2020-06-11 LAB — SALICYLATE LEVEL: Salicylate Lvl: <7 mg/dL — ABNORMAL LOW (ref 7.0–30.0)

## 2020-06-11 MED ORDER — METFORMIN HCL 500 MG PO TABS: 500.0000 mg | ORAL_TABLET | Freq: Two times a day (BID) | ORAL | Status: DC

## 2020-06-11 MED ORDER — HALOPERIDOL LACTATE 5 MG/ML IJ SOLN
5.0000 mg | Freq: Once | INTRAMUSCULAR | Status: AC
  Administered 2020-06-11: 5 mg via INTRAMUSCULAR
  Filled 2020-06-11: qty 1

## 2020-06-11 MED ORDER — METFORMIN HCL 500 MG PO TABS
500.0000 mg | ORAL_TABLET | Freq: Two times a day (BID) | ORAL | Status: DC
  Administered 2020-06-12: 500 mg via ORAL
  Filled 2020-06-11: qty 1

## 2020-06-11 NOTE — BH Assessment (Signed)
Clinician attempted to complete assessment but pt continues to be asleep and is unable to be aroused at this time. TTS will attempt assessment at a later time.

## 2020-06-11 NOTE — ED Notes (Addendum)
Pt refusing blood work and to change into purple scrubs.

## 2020-06-11 NOTE — ED Notes (Signed)
Pt took hospital off and began chewing it up. Pt threw hospital bracelet in trash can and began to walk out of hospital. Security redirected pt to bed. Pt becoming tearful and stating she "hates this world."

## 2020-06-11 NOTE — ED Notes (Signed)
One trash bag full of pt belongings with a pair of shoes in a belonging bag moved away from pt and behind nurses desk 13-18.

## 2020-06-11 NOTE — BH Assessment (Signed)
Attempted assessment in person.  Pt was given Haldol and sedated.  Attempt assessment later.
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
## 2020-06-11 NOTE — ED Triage Notes (Signed)
Pt arrives by [place] police. Pt is IVC'd by the jail  Per Police; Pt was found to be smearing feces on her face, hearing voices and talking to herself.

## 2020-06-11 NOTE — ED Notes (Signed)
Pt currently asleep. Will try to get blood once pt is awake.

## 2020-06-12 ENCOUNTER — Encounter (HOSPITAL_COMMUNITY): Payer: Self-pay | Admitting: Registered Nurse

## 2020-06-12 ENCOUNTER — Other Ambulatory Visit: Payer: Self-pay

## 2020-06-12 ENCOUNTER — Emergency Department (HOSPITAL_COMMUNITY)
Admission: EM | Admit: 2020-06-12 | Discharge: 2020-06-12 | Discharge status: Against Medical Advice | Payer: Medicaid Other | Attending: Emergency Medicine | Admitting: Emergency Medicine

## 2020-06-12 DIAGNOSIS — Z5900 Homelessness unspecified: Secondary | ICD-10-CM

## 2020-06-12 LAB — RESP PANEL BY RT-PCR (FLU A&B, COVID) ARPGX2
Influenza A by PCR: NEGATIVE
Influenza B by PCR: NEGATIVE
SARS Coronavirus 2 by RT PCR: NEGATIVE

## 2020-06-12 LAB — RAPID URINE DRUG SCREEN, HOSP PERFORMED
Amphetamines: NOT DETECTED
Barbiturates: NOT DETECTED
Benzodiazepines: NOT DETECTED
Cocaine: NOT DETECTED
Opiates: NOT DETECTED
Tetrahydrocannabinol: NOT DETECTED

## 2020-06-12 MED ORDER — STERILE WATER FOR INJECTION IJ SOLN
INTRAMUSCULAR | Status: AC
  Administered 2020-06-12: 2.1 mL
  Filled 2020-06-12: qty 10

## 2020-06-12 MED ORDER — ACETAMINOPHEN 325 MG PO TABS
650.0000 mg | ORAL_TABLET | Freq: Once | ORAL | Status: AC
  Administered 2020-06-12: 650 mg via ORAL
  Filled 2020-06-12: qty 2

## 2020-06-12 MED ORDER — ONDANSETRON 4 MG PO TBDP
4.0000 mg | ORAL_TABLET | Freq: Once | ORAL | Status: DC
  Filled 2020-06-12: qty 1

## 2020-06-12 MED ORDER — ZIPRASIDONE MESYLATE 20 MG IM SOLR
20.0000 mg | Freq: Once | INTRAMUSCULAR | Status: AC
  Administered 2020-06-12: 20 mg via INTRAMUSCULAR
  Filled 2020-06-12: qty 20

## 2020-06-12 NOTE — ED Notes (Signed)
Asked patient to provide a urine sample. She said "No you're not my momma you don't tell me what to do." Patient talking to herself in the bed and took her pants off. Blanket is placed on the patient.

## 2020-06-12 NOTE — Consult Note (Signed)
[place] Psychiatry Consult   Reason for Consult:  Psychotic behavior  Referring Physician:  [facility] EDP Patient Identification: LUPIE SAWA MRN:  [id number] Principal Diagnosis: Bipolar 1 disorder ([place]) Diagnosis:  Principal Problem:   Bipolar 1 disorder ([place]) Active Problems:   Bipolar disorder with moderate depression ([place])   Homelessness   Total Time spent with patient: 30 minutes  Subjective:   [name] [name] is a 50 y.o. female patient admitted to [facility] ED after present to ED via law enforcement from jail when found smearing feces on her face, and auditory hallucinations.  HPI:  [name] [name], 50 y.o., female patient seen face to face by this provider and Dr. Dwyane Dee; and chart reviewed on 06/12/20.  On evaluation [name] [name] reports she just got out of jail yesterday.  States she has been off of her medications for 2 weeks related to missing her appointment.  States she has outpatient psychiatric services with [facility] and needs to reschedule her appointment to get her medications refilled.  Patient states she is currently homeless but may be able to go stay with a friend or family member.   During evaluation FAELYN SIGLER is sitting on side of bed eating breakfast in no acute distress.  She is alert, oriented x 4, calm and cooperative.  Her mood is euthymic with congruent affect.  She does not appear to be responding to internal/external stimuli or delusional thoughts.  Patient denies suicidal/self-harm/homicidal ideation, psychosis, and paranoia.  Patient answered question appropriately.  Past Psychiatric History: See above  Risk to Self:  No Risk to Others:  No Prior Inpatient Therapy:  Yes Prior Outpatient Therapy:  Yes  Past Medical History:  Past Medical History:  Diagnosis Date  . Anxiety disorder   . Asthma   . Bipolar affective disorder ([place])   . Depression   . Diabetes mellitus   . Migraine   . Schizophrenia [facility])     Past Surgical History:   Procedure Laterality Date  . KNEE ARTHROSCOPY     x 2  . KNEE SURGERY Left    Incision made and knee cleaned out  . TUBAL LIGATION     Family History:  Family History  Problem Relation Age of Onset  . Bipolar disorder Mother   . Hypertension Father   . Diabetes Father    Family Psychiatric  History: See above Social History:  Social History   Substance and Sexual Activity  Alcohol Use Not Currently   Comment: occasionally     Social History   Substance and Sexual Activity  Drug Use No    Social History   Socioeconomic History  . Marital status: Divorced    Spouse name: Not on file  . Number of children: Not on file  . Years of education: Not on file  . Highest education level: Not on file  Occupational History  . Not on file  Tobacco Use  . Smoking status: Current Every Day Smoker    Packs/day: 1.00    Years: 29.00    Pack years: 29.00    Types: Cigarettes  . Smokeless tobacco: Never Used  Vaping Use  . Vaping Use: Former  Substance and Sexual Activity  . Alcohol use: Not Currently    Comment: occasionally  . Drug use: No  . Sexual activity: Yes    Birth control/protection: Surgical  Other Topics Concern  . Not on file  Social History Narrative   Patient lives in [place] with her  2 kids- 38 in 35 years old.   She has total 4 kids.   She is a single mom.         Social Determinants of Health   Financial Resource Strain: Not on file  Food Insecurity: Not on file  Transportation Needs: Not on file  Physical Activity: Not on file  Stress: Not on file  Social Connections: Not on file   Additional Social History:    Allergies:   Allergies  Allergen Reactions  . Tramadol Nausea Only    "feeling like I'm on fire"  . Nicotine Rash    Patient states she is allergic to the Nicotine patch and they cause her to break out in a rash.  She states she can smoke cigarettes.    Labs:  Results for orders placed or performed during the hospital  encounter of 06/11/20 (from the past 48 hour(s))  Resp Panel by RT-PCR (Flu A&B, Covid) Nasopharyngeal Swab     Status: None   Collection Time: 06/11/20 12:15 AM   Specimen: Nasopharyngeal Swab; Nasopharyngeal(NP) swabs in vial transport medium  Result Value Ref Range   SARS Coronavirus 2 by RT PCR NEGATIVE NEGATIVE    Comment: (NOTE) SARS-CoV-2 target nucleic acids are NOT DETECTED.  The SARS-CoV-2 RNA is generally detectable in upper respiratory specimens during the acute phase of infection. The lowest concentration of SARS-CoV-2 viral copies this assay can detect is 138 copies/mL. A negative result does not preclude SARS-Cov-2 infection and should not be used as the sole basis for treatment or other patient management decisions. A negative result may occur with  improper specimen collection/handling, submission of specimen other than nasopharyngeal swab, presence of viral mutation(s) within the areas targeted by this assay, and inadequate number of viral copies(<138 copies/mL). A negative result must be combined with clinical observations, patient history, and epidemiological information. The expected result is Negative.  Fact Sheet for Patients:  EntrepreneurPulse.com.au  Fact Sheet for Healthcare Providers:  IncredibleEmployment.be  This test is no t yet approved or cleared by the [place] FDA and  has been authorized for detection and/or diagnosis of SARS-CoV-2 by FDA under an Emergency Use Authorization (EUA). This EUA will remain  in effect (meaning this test can be used) for the duration of the COVID-19 declaration under Section 564(b)(1) of the Act, 21 U.S.C.section 360bbb-3(b)(1), unless the authorization is terminated  or revoked sooner.       Influenza A by PCR NEGATIVE NEGATIVE   Influenza B by PCR NEGATIVE NEGATIVE    Comment: (NOTE) The Xpert Xpress SARS-CoV-2/FLU/RSV plus assay is intended as an aid in the diagnosis of  influenza from Nasopharyngeal swab specimens and should not be used as a sole basis for treatment. Nasal washings and aspirates are unacceptable for Xpert Xpress SARS-CoV-2/FLU/RSV testing.  Fact Sheet for Patients: EntrepreneurPulse.com.au  Fact Sheet for Healthcare Providers: IncredibleEmployment.be  This test is not yet approved or cleared by the [place] FDA and has been authorized for detection and/or diagnosis of SARS-CoV-2 by FDA under an Emergency Use Authorization (EUA). This EUA will remain in effect (meaning this test can be used) for the duration of the COVID-19 declaration under Section 564(b)(1) of the Act, 21 U.S.C. section 360bbb-3(b)(1), unless the authorization is terminated or revoked.  Performed at [facility], [place] [address], [place], [place] [address]   Comprehensive metabolic panel     Status: Abnormal   Collection Time: 06/11/20 12:19 PM  Result Value Ref Range   Sodium  138 135 - 145 mmol/L   Potassium 4.2 3.5 - 5.1 mmol/L   Chloride 105 98 - 111 mmol/L   CO2 24 22 - 32 mmol/L   Glucose, Bld 145 (H) 70 - 99 mg/dL    Comment: Glucose reference range applies only to samples taken after fasting for at least 8 hours.   BUN 9 6 - 20 mg/dL   Creatinine, Ser 0.80 0.44 - 1.00 mg/dL   Calcium 9.0 8.9 - 10.3 mg/dL   Total Protein 6.7 6.5 - 8.1 g/dL   Albumin 3.8 3.5 - 5.0 g/dL   AST 17 15 - 41 U/L   ALT 17 0 - 44 U/L   Alkaline Phosphatase 83 38 - 126 U/L   Total Bilirubin 0.5 0.3 - 1.2 mg/dL   GFR, Estimated >60 >60 mL/min    Comment: (NOTE) Calculated using the CKD-EPI Creatinine Equation (2021)    Anion gap 9 5 - 15    Comment: Performed at [facility], [place] [address], [place], [place] [address]  Ethanol     Status: None   Collection Time: 06/11/20 12:19 PM  Result Value Ref Range   Alcohol, Ethyl (B) <10 <10 mg/dL    Comment: (NOTE) Lowest detectable limit for serum  alcohol is 10 mg/dL.  For medical purposes only. Performed at [facility], [place] [address] [place], [place] [address]   CBC with Diff     Status: None   Collection Time: 06/11/20 12:19 PM  Result Value Ref Range   WBC 7.7 4.0 - 10.5 K/uL   RBC 4.41 3.87 - 5.11 MIL/uL   Hemoglobin 12.8 12.0 - 15.0 g/dL   HCT 39.3 36.0 - 46.0 %   MCV 89.1 80.0 - 100.0 fL   MCH 29.0 26.0 - 34.0 pg   MCHC 32.6 30.0 - 36.0 g/dL   RDW 13.9 11.5 - 15.5 %   Platelets 259 150 - 400 K/uL   nRBC 0.0 0.0 - 0.2 %   Neutrophils Relative % 55 %   Neutro Abs 4.3 1.7 - 7.7 K/uL   Lymphocytes Relative 31 %   Lymphs Abs 2.4 0.7 - 4.0 K/uL   Monocytes Relative 10 %   Monocytes Absolute 0.7 0.1 - 1.0 K/uL   Eosinophils Relative 3 %   Eosinophils Absolute 0.2 0.0 - 0.5 K/uL   Basophils Relative 1 %   Basophils Absolute 0.0 0.0 - 0.1 K/uL   Immature Granulocytes 0 %   Abs Immature Granulocytes 0.03 0.00 - 0.07 K/uL    Comment: Performed at [facility], [place] [address] [place], [place] [address]  Salicylate level     Status: Abnormal   Collection Time: 06/11/20 12:19 PM  Result Value Ref Range   Salicylate Lvl <7.1 (L) 7.0 - 30.0 mg/dL    Comment: Performed at [facility] [place] [address], [place], [place] [address]  Acetaminophen level     Status: Abnormal   Collection Time: 06/11/20 12:19 PM  Result Value Ref Range   Acetaminophen (Tylenol), Serum <10 (L) 10 - 30 ug/mL    Comment: (NOTE) Therapeutic concentrations vary significantly. A range of 10-30 ug/mL  may be an effective concentration for many patients. However, some  are best treated at concentrations outside of this range. Acetaminophen concentrations >150 ug/mL at 4 hours after ingestion  and >50 ug/mL at 12 hours after ingestion are often associated with  toxic reactions.  Performed at [facility], [address] [place], [place] [address]   POC CBG, ED     Status:  Abnormal   Collection Time: 06/11/20 12:30 PM  Result Value Ref Range   Glucose-Capillary 191 (H) 70 - 99 mg/dL    Comment: Glucose reference range applies only to samples taken after fasting for at least 8 hours.  I-Stat beta hCG blood, ED     Status: None   Collection Time: 06/11/20  5:51 PM  Result Value Ref Range   I-stat hCG, quantitative <5.0 <5 mIU/mL   Comment 3            Comment:   GEST. AGE      CONC.  (mIU/mL)   <=1 WEEK        5 - 50     2 WEEKS       50 - 500     3 WEEKS       100 - 10,000     4 WEEKS     1,000 - 30,000        FEMALE AND NON-PREGNANT FEMALE:     LESS THAN 5 mIU/mL     Current Facility-Administered Medications  Medication Dose Route Frequency Provider Last Rate Last Admin  . metFORMIN (GLUCOPHAGE) tablet 500 mg  500 mg Oral BID WC Pattricia Boss, MD   500 mg at 06/12/20 8413  . ondansetron (ZOFRAN-ODT) disintegrating tablet 4 mg  4 mg Oral Once Tacy Learn, PA-C       Current Outpatient Medications  Medication Sig Dispense Refill  . APPLE CIDER VINEGAR PO Take 1 capsule by mouth 3 (three) times daily with meals.    . budesonide-formoterol (SYMBICORT) 160-4.5 MCG/ACT inhaler Inhale 2 puffs into the lungs daily.    . Cholecalciferol (VITAMIN D3) 250 MCG (10000 UT) capsule Take 10,000 Units by mouth daily.    . fluticasone (FLONASE) 50 MCG/ACT nasal spray Place 2 sprays into both nostrils daily.    . Lactobacillus (PROBIOTIC ACIDOPHILUS) CAPS Take 1 capsule by mouth daily.    . metFORMIN (GLUCOPHAGE) 1000 MG tablet Take 0.5 tablets (500 mg total) by mouth 2 (two) times daily. (Patient taking differently: Take 1,000 mg by mouth 2 (two) times daily.) 30 tablet 0  . metoprolol succinate (TOPROL-XL) 25 MG 24 hr tablet Take 12.5 mg by mouth daily.    . Multiple Vitamin (MULTIVITAMIN WITH MINERALS) TABS tablet Take 1 tablet by mouth daily.    . promethazine (PHENERGAN) 25 MG tablet Take 12.5-25 mg by mouth every 8 (eight) hours as needed for vomiting or  nausea.    . sertraline (ZOLOFT) 100 MG tablet Take 1 tablet (100 mg total) by mouth daily. (Patient taking differently: Take 150 mg by mouth daily.) 30 tablet 2  . traZODone (DESYREL) 50 MG tablet Take 1 tablet (50 mg total) by mouth at bedtime as needed for sleep. (Patient taking differently: Take 50-150 mg by mouth at bedtime as needed for sleep.)    . blood glucose meter kit and supplies Dispense based on patient and insurance preference. Use up to four times daily as directed. (FOR ICD-10 E10.9, E11.9). 1 each 0  . Insulin Pen Needle (PEN NEEDLES 3/16") 31G X 5 MM MISC 15 Units by Does not apply route daily. 100 each 0    Musculoskeletal: Strength & Muscle Tone: within normal limits Gait & Station: normal Patient leans: N/A  Psychiatric Specialty Exam: Physical Exam Vitals and nursing note reviewed. Chaperone present: Sitter at bedside.  Constitutional:  General: She is not in acute distress.    Appearance: Normal appearance. She is not ill-appearing.  HENT:     Head: Normocephalic and atraumatic.  Eyes:     Pupils: Pupils are equal, round, and reactive to light.  Cardiovascular:     Rate and Rhythm: Normal rate.  Pulmonary:     Effort: Pulmonary effort is normal.  Musculoskeletal:        General: Normal range of motion.     Cervical back: Normal range of motion.  Skin:    General: Skin is warm and dry.  Neurological:     Mental Status: She is alert and oriented to person, place, and time.  Psychiatric:        Attention and Perception: Attention and perception normal. She does not perceive auditory or visual hallucinations.        Mood and Affect: Mood and affect normal.        Speech: Speech normal.        Behavior: Behavior normal. Behavior is cooperative.        Thought Content: Thought content normal. Thought content is not paranoid or delusional. Thought content does not include homicidal or suicidal ideation.        Cognition and Memory: Cognition and memory  normal.        Judgment: Judgment normal.     Review of Systems  Constitutional: Negative.   HENT: Negative.   Eyes: Negative.   Respiratory: Negative.   Cardiovascular: Negative.   Gastrointestinal: Negative.   Genitourinary: Negative.   Musculoskeletal: Negative.   Skin: Negative.   Neurological: Negative.   Hematological: Negative.   Psychiatric/Behavioral: Negative for agitation, behavioral problems, confusion, hallucinations, self-injury and sleep disturbance. Suicidal ideas: Denies. The patient is not hyperactive. Nervous/anxious: Stable.     Blood pressure 136/83, pulse (!) 105, temperature 99.2 F (37.3 C), temperature source Oral, resp. rate 16, SpO2 95 %.There is no height or weight on file to calculate BMI.  General Appearance: Casual  Eye Contact:  Good  Speech:  Clear and Coherent and Normal Rate  Volume:  Normal  Mood:  Euthymic  Affect:  Appropriate and Congruent  Thought Process:  Coherent, Goal Directed and Descriptions of Associations: Intact  Orientation:  Full (Time, Place, and Person)  Thought Content:  WDL  Suicidal Thoughts:  No  Homicidal Thoughts:  No  Memory:  Immediate;   Good Recent;   Good  Judgement:  Intact  Insight:  Present  Psychomotor Activity:  Normal  Concentration:  Concentration: Good and Attention Span: Good  Recall:  Good  Fund of Knowledge:  Fair  Language:  Good  Akathisia:  No  Handed:  Right  AIMS (if indicated):     Assets:  Communication Skills Desire for Improvement Social Support  ADL's:  Intact  Cognition:  WNL  Sleep:       Treatment Plan Summary: Plan Psychiatrically clear   Patient to follow up with [facility] for medication management  Disposition:  Psychiatrically cleared No evidence of imminent risk to self or others at present.   Patient does not meet criteria for psychiatric inpatient admission. Discussed crisis plan, support from social network, calling 911, coming to the Emergency Department,  and calling Suicide Hotline.  Bradshaw Minihan, NP 06/12/2020 11:36 AM

## 2020-06-12 NOTE — Discharge Instructions (Signed)
For your behavioral health needs, you are advised to continue treatment with your provider at [facility].

## 2020-06-12 NOTE — ED Notes (Signed)
Patients belongings placed in charge nursing station. Patient has two bags. One in cabinet and other next to 16-22 nurse.

## 2020-06-12 NOTE — ED Provider Notes (Signed)
Emergency Medicine Observation Re-evaluation Note  [name] [name] is a 50 y.o. female, seen on rounds today.  Pt initially presented to the ED for complaints of IVC Currently, the patient is sleeping in hall bed.  Physical Exam  BP (!) 125/95 (BP Location: Left Arm)   Pulse 90   Temp 98.6 F (37 C) (Oral)   Resp 16   SpO2 99%  Physical Exam General: sleeping, vitals stable Lungs: respirations even and unlabored  ED Course / MDM  EKG:    I have reviewed the labs performed to date as well as medications administered while in observation.  Recent changes in the last 24 hours include refusing to stay in bed, awake, talking.  Plan  Current plan is for pending TTS evaluation and plan. Patient is under full IVC at this time.   Jeannie Fend, PA-C 06/12/20 0740    Wynetta Fines, MD 06/13/20 [phone]

## 2020-06-12 NOTE — BH Assessment (Signed)
[facility] Assessment Progress Note  Per Nelly Rout, MD, this pt does not require psychiatric hospitalization at this time.  Pt presents under IVC initiated by a Biochemist, clinical and upheld by EDP Margarita Grizzle, MD, which has been rescinded by Dr Lucianne Muss.  Pt is psychiatrically cleared.  Discharge instructions advise pt to continue treatment with her current outpatient provider at [facility].  EDP Kristine Royal, MD and pt's nurse, Molli Hazard, have been notified.  Doylene Canning, MA Triage Specialist [phone]

## 2020-06-12 NOTE — ED Provider Notes (Signed)
Patient was not seen by myself. Security apparently escorted her off the property.    Wynetta Fines, MD 06/12/20 [phone]

## 2020-06-12 NOTE — ED Notes (Signed)
Attempted to get pt into a triage room to talk with her. Pt lost a dollar in the vending machine and is refusing to leave the vending machine as she wants her dollar back. Pt began to talk back and fuss with security. [employer] and security at side of pt and will be escorting pt off the property at this time as she is unwilling to be seen but also unwilling to leave.

## 2020-06-12 NOTE — ED Notes (Signed)
Pt is noncompliant with staying in her designated area. She will get up and try to go through the facility, stating she wants to walk. Security is speaking with her to explain policy and risk with volume of pts/staff in facility.

## 2020-06-12 NOTE — ED Notes (Signed)
Patient pacing down the halls. Says "I will do what I want to. I ain't taking that medicine is that the Covid shot." Patient increasingly agitted aand refusing to stay in her bed.

## 2020-06-14 ENCOUNTER — Encounter (HOSPITAL_COMMUNITY): Payer: Self-pay | Admitting: Emergency Medicine

## 2020-06-14 ENCOUNTER — Emergency Department (HOSPITAL_COMMUNITY)
Admission: EM | Admit: 2020-06-14 | Discharge: 2020-06-14 | Disposition: A | Discharge status: Home / Self Care | Payer: Medicaid Other | Attending: Emergency Medicine | Admitting: Emergency Medicine

## 2020-06-14 DIAGNOSIS — Z7951 Long term (current) use of inhaled steroids: Secondary | ICD-10-CM | POA: Diagnosis not present

## 2020-06-14 DIAGNOSIS — Z7984 Long term (current) use of oral hypoglycemic drugs: Secondary | ICD-10-CM | POA: Insufficient documentation

## 2020-06-14 DIAGNOSIS — I10 Essential (primary) hypertension: Secondary | ICD-10-CM | POA: Insufficient documentation

## 2020-06-14 DIAGNOSIS — Z794 Long term (current) use of insulin: Secondary | ICD-10-CM | POA: Insufficient documentation

## 2020-06-14 DIAGNOSIS — F17210 Nicotine dependence, cigarettes, uncomplicated: Secondary | ICD-10-CM | POA: Insufficient documentation

## 2020-06-14 DIAGNOSIS — J45909 Unspecified asthma, uncomplicated: Secondary | ICD-10-CM | POA: Diagnosis not present

## 2020-06-14 DIAGNOSIS — R002 Palpitations: Secondary | ICD-10-CM | POA: Insufficient documentation

## 2020-06-14 DIAGNOSIS — Z79899 Other long term (current) drug therapy: Secondary | ICD-10-CM | POA: Diagnosis not present

## 2020-06-14 DIAGNOSIS — E1110 Type 2 diabetes mellitus with ketoacidosis without coma: Secondary | ICD-10-CM | POA: Insufficient documentation

## 2020-06-14 DIAGNOSIS — R456 Violent behavior: Secondary | ICD-10-CM | POA: Insufficient documentation

## 2020-06-14 DIAGNOSIS — R4689 Other symptoms and signs involving appearance and behavior: Secondary | ICD-10-CM

## 2020-06-14 LAB — CBG MONITORING, ED: Glucose-Capillary: 200 mg/dL — ABNORMAL HIGH (ref 70–99)

## 2020-06-14 NOTE — ED Triage Notes (Signed)
Pt arriving POV concerned about her heart rate and a sore on her tongue that appears to be a canker sore. Pt reports sore has been present for several days.

## 2020-06-14 NOTE — ED Notes (Signed)
Patient is chewing on her face mask, stating "because I am hungry."

## 2020-06-14 NOTE — ED Provider Notes (Signed)
[place] DEPT Provider Note   CSN: [id number] Arrival date & time: 06/14/20  0147     History Chief Complaint  Patient presents with  . Tachycardia    102    [name] [name] is a 50 y.o. female.  Patient here with multiple complaints. She is unsure why she actually came. She states over the last week she has had hip pain, abdominal pain, groin pain, increased urination, decreased urination, increased blood sugar, a burn on her neck, feeling that her heart rate is fast, "feel like shit", cough, flushed skin amongst many other things that I cannot remember at this time. When I asked her what her emergency reason for evaluation was she got very upset. She stated "I do not fucking know" and proceeded to throw her pen and paper at me. The interview was discontinued at that time        Past Medical History:  Diagnosis Date  . Anxiety disorder   . Asthma   . Bipolar affective disorder ([place])   . Depression   . Diabetes mellitus   . Migraine   . Schizophrenia [facility])     Patient Active Problem List   Diagnosis Date Noted  . Homelessness 06/12/2020  . Bipolar 1 disorder ([place]) 12/18/2018  . Suicide attempt ([place])   . Overdose, intentional self-harm, initial encounter ([place]) 12/13/2018  . Bipolar I disorder, single manic episode, severe, with psychosis ([place]) 06/09/2018  . Bipolar disorder ([place]) 06/09/2018  . DKA, type 2 ([place]) 01/06/2018  . Diabetic acidosis without coma ([place])   . Adjustment disorder with depressed mood 01/04/2018  . Oral thrush   . Acute lower UTI   . DKA, type 2, not at goal [facility]) 01/03/2018  . Thrush 01/03/2018  . DM (diabetes mellitus), type 2, uncontrolled ([place]) 06/07/2017  . Abscess of groin, right 06/07/2017  . Abscess 06/07/2017  . Bipolar disorder with moderate depression ([place]) 08/31/2015  . Tobacco use disorder 08/28/2015  . HTN (hypertension) 08/28/2015  . Diabetes ([place]) 08/28/2015  . Hyponatremia 08/28/2015  . T2DM  (type 2 diabetes mellitus) ([place]) 10/26/2009  . OBESITY 10/26/2009  . Essential hypertension 10/26/2009  . DISTURBANCE OF SKIN SENSATION 10/26/2009  . Migraine headache 03/05/2007    Past Surgical History:  Procedure Laterality Date  . KNEE ARTHROSCOPY     x 2  . KNEE SURGERY Left    Incision made and knee cleaned out  . TUBAL LIGATION       OB History   No obstetric history on file.     Family History  Problem Relation Age of Onset  . Bipolar disorder Mother   . Hypertension Father   . Diabetes Father     Social History   Tobacco Use  . Smoking status: Current Every Day Smoker    Packs/day: 1.00    Years: 29.00    Pack years: 29.00    Types: Cigarettes  . Smokeless tobacco: Never Used  Vaping Use  . Vaping Use: Former  Substance Use Topics  . Alcohol use: Not Currently    Comment: occasionally  . Drug use: No    Home Medications Prior to Admission medications   Medication Sig Start Date End Date Taking? Authorizing Provider  APPLE CIDER VINEGAR PO Take 1 capsule by mouth 3 (three) times daily with meals.    [provider]  blood glucose meter kit and supplies Dispense based on patient and insurance preference. Use up to four times daily as directed. (  FOR ICD-10 E10.9, E11.9). 01/06/18   Eugenie Filler, MD  budesonide-formoterol [facility]) 160-4.5 MCG/ACT inhaler Inhale 2 puffs into the lungs daily.    [provider]  Cholecalciferol (VITAMIN D3) 250 MCG (10000 UT) capsule Take 10,000 Units by mouth daily.    [provider]  fluticasone (FLONASE) 50 MCG/ACT nasal spray Place 2 sprays into both nostrils daily.    [provider]  Insulin Pen Needle (PEN NEEDLES 3/16") 31G X 5 MM MISC 15 Units by Does not apply route daily. 01/06/18   Eugenie Filler, MD  Lactobacillus (PROBIOTIC ACIDOPHILUS) CAPS Take 1 capsule by mouth daily.    [provider]  metFORMIN (GLUCOPHAGE) 1000 MG tablet Take 0.5 tablets (500 mg  total) by mouth 2 (two) times daily. Patient taking differently: Take 1,000 mg by mouth 2 (two) times daily. 10/08/18   Andee Poles, MD  metoprolol succinate (TOPROL-XL) 25 MG 24 hr tablet Take 12.5 mg by mouth daily. 03/24/20   [provider]  Multiple Vitamin (MULTIVITAMIN WITH MINERALS) TABS tablet Take 1 tablet by mouth daily.    [provider]  promethazine (PHENERGAN) 25 MG tablet Take 12.5-25 mg by mouth every 8 (eight) hours as needed for vomiting or nausea. 02/25/20   [provider]  sertraline (ZOLOFT) 100 MG tablet Take 1 tablet (100 mg total) by mouth daily. Patient taking differently: Take 150 mg by mouth daily. 12/23/18   Johnn Hai, MD  traZODone (DESYREL) 50 MG tablet Take 1 tablet (50 mg total) by mouth at bedtime as needed for sleep. Patient taking differently: Take 50-150 mg by mouth at bedtime as needed for sleep. 12/16/18   Antonieta Pert, MD  albuterol (PROVENTIL HFA;VENTOLIN HFA) 108 (90 Base) MCG/ACT inhaler Inhale 2 puffs into the lungs every 6 (six) hours as needed for wheezing or shortness of breath. Patient not taking: Reported on 12/13/2018 01/06/18 06/04/20  Eugenie Filler, MD  benztropine (COGENTIN) 1 MG tablet Take 1 tablet (1 mg total) by mouth 2 (two) times daily. 12/23/18 06/04/20  Johnn Hai, MD  insulin aspart (NOVOLOG) 100 UNIT/ML injection Inject 0-9 Units into the skin 3 (three) times daily with meals. Patient not taking: Reported on 06/03/2020 12/16/18 06/04/20  Antonieta Pert, MD  mirtazapine (REMERON) 15 MG tablet Take 1 tablet (15 mg total) by mouth at bedtime. Patient not taking: Reported on 06/03/2020 12/22/18 06/04/20  Johnn Hai, MD  risperiDONE (RISPERDAL) 2 MG tablet Take 1 tablet (2 mg total) by mouth 2 (two) times daily. 12/23/18 06/04/20  Johnn Hai, MD    Allergies    Tramadol and Nicotine  Review of Systems   Review of Systems  Unable to perform ROS: Other    Physical Exam Updated Vital Signs BP (!)  153/98 (BP Location: Left Arm)   Pulse (!) 102   Temp 98.1 F (36.7 C) (Oral)   Resp 18   SpO2 95%   Physical Exam Vitals and nursing note reviewed.  Constitutional:      Appearance: She is well-developed and well-nourished.  HENT:     Head: Normocephalic and atraumatic.     Nose: Nose normal. No congestion or rhinorrhea.     Mouth/Throat:     Mouth: Mucous membranes are moist.  Eyes:     Pupils: Pupils are equal, round, and reactive to light.  Cardiovascular:     Rate and Rhythm: Normal rate and regular rhythm.  Pulmonary:     Effort: No respiratory distress.  Breath sounds: No stridor.  Abdominal:     General: There is no distension.  Musculoskeletal:     Cervical back: Normal range of motion.  Neurological:     Mental Status: She is alert.  Psychiatric:        Mood and Affect: Affect is labile and inappropriate.        Behavior: Behavior is aggressive.     ED Results / Procedures / Treatments   Labs (all labs ordered are listed, but only abnormal results are displayed) Labs Reviewed  CBG MONITORING, ED - Abnormal; Notable for the following components:      Result Value   Glucose-Capillary 200 (*)    All other components within normal limits    EKG None  Radiology No results found.  Procedures Procedures (including critical care time)  Medications Ordered in ED Medications - No data to display  ED Course  I have reviewed the triage vital signs and the nursing notes.  Pertinent labs & imaging results that were available during my care of the patient were reviewed by me and considered in my medical decision making (see chart for details).    MDM Rules/Calculators/A&P                          Multiple complaints. Unable to assess secondary to patient's aggressive behavior. Seem to be more of a personality disorder than a psychiatric aggression. At this time she is deemed to be clear from an emergency medicine standpoint and stable for PCP follow-up  for her many complaints.  Final Clinical Impression(s) / ED Diagnoses Final diagnoses:  Aggressive behavior  Palpitations    Rx / DC Orders ED Discharge Orders    None       Dashun Borre, Corene Cornea, MD 06/14/20 [phone]

## 2020-06-14 NOTE — ED Notes (Signed)
Entered room with Jeanice Lim, RN to discharge pt. Pt refused to leave and refused to sign out. When I told pt it was time for her to go she proceeded to respond with "fuck you, I don't want to talk to you" to [place] and myself. Pt also made a fist towards [place] while saying "fuck you, you can get the police or security. I'm not leaving." Security standing by in the hallway and intervened. Pt continued to refuse to leave and security contacted [employer] to have pt removed

## 2020-06-14 NOTE — ED Notes (Signed)
Patient refusing to leave ED. Security notified and [employer] at bedside to escort patient out.

## 2020-06-21 ENCOUNTER — Encounter: Payer: Self-pay | Admitting: Critical Care Medicine

## 2020-06-21 NOTE — Progress Notes (Signed)
Patient ID: [name] [name], female   DOB: 10-10-1970, 50 y.o.   MRN: [id number] This is a 50 year old female seen today in the [facility] she just arrived here this afternoon.  She does have an existing primary care provider and chronic pain management provider at the [facility].  She has a history of recent ER visits with aggressive behavior brought in by the police with with IVC.  The behavioral health team released her on both occasions and says she was stable for discharge.  Tonight she is not quite as aggressive but very difficult to focus and rambling in her speech.  She was asking if we could find out if her medications at [facility] were ready that would been written by her primary care provider.  I called [facility] and she has sertraline metformin trazodone metoprolol and promethazine available  On exam blood pressure is 195/100 8 pulse 114 saturation 96% room air blood sugar 300  On exam she is very easily distracted rambling delusional but not aggressive or violent  Plan here would be to have her follow-up with her primary care providers which she already has  I called [facility] and confirm these medicines are ready pickup I told the patient she needs to go and pick up her medications and get started back on them  No other needs were addressed or seen at this time

## 2020-07-16 ENCOUNTER — Encounter (HOSPITAL_COMMUNITY): Payer: Self-pay | Admitting: Emergency Medicine

## 2020-07-16 ENCOUNTER — Other Ambulatory Visit: Payer: Self-pay

## 2020-07-16 DIAGNOSIS — Z5900 Homelessness unspecified: Secondary | ICD-10-CM

## 2020-07-16 DIAGNOSIS — G4700 Insomnia, unspecified: Secondary | ICD-10-CM | POA: Diagnosis present

## 2020-07-16 DIAGNOSIS — Z794 Long term (current) use of insulin: Secondary | ICD-10-CM

## 2020-07-16 DIAGNOSIS — E669 Obesity, unspecified: Secondary | ICD-10-CM | POA: Diagnosis present

## 2020-07-16 DIAGNOSIS — R45851 Suicidal ideations: Secondary | ICD-10-CM | POA: Diagnosis present

## 2020-07-16 DIAGNOSIS — I10 Essential (primary) hypertension: Secondary | ICD-10-CM | POA: Diagnosis present

## 2020-07-16 DIAGNOSIS — F419 Anxiety disorder, unspecified: Secondary | ICD-10-CM | POA: Diagnosis present

## 2020-07-16 DIAGNOSIS — Z781 Physical restraint status: Secondary | ICD-10-CM

## 2020-07-16 DIAGNOSIS — U071 COVID-19: Secondary | ICD-10-CM | POA: Diagnosis present

## 2020-07-16 DIAGNOSIS — Z7951 Long term (current) use of inhaled steroids: Secondary | ICD-10-CM

## 2020-07-16 DIAGNOSIS — Z6829 Body mass index (BMI) 29.0-29.9, adult: Secondary | ICD-10-CM

## 2020-07-16 DIAGNOSIS — F17210 Nicotine dependence, cigarettes, uncomplicated: Secondary | ICD-10-CM | POA: Diagnosis present

## 2020-07-16 DIAGNOSIS — E1165 Type 2 diabetes mellitus with hyperglycemia: Secondary | ICD-10-CM | POA: Diagnosis present

## 2020-07-16 DIAGNOSIS — F319 Bipolar disorder, unspecified: Secondary | ICD-10-CM | POA: Diagnosis present

## 2020-07-16 DIAGNOSIS — Z79899 Other long term (current) drug therapy: Secondary | ICD-10-CM

## 2020-07-16 DIAGNOSIS — N179 Acute kidney failure, unspecified: Secondary | ICD-10-CM | POA: Diagnosis present

## 2020-07-16 DIAGNOSIS — Z7984 Long term (current) use of oral hypoglycemic drugs: Secondary | ICD-10-CM

## 2020-07-16 DIAGNOSIS — N10 Acute tubulo-interstitial nephritis: Secondary | ICD-10-CM | POA: Diagnosis present

## 2020-07-16 DIAGNOSIS — J45909 Unspecified asthma, uncomplicated: Secondary | ICD-10-CM | POA: Diagnosis present

## 2020-07-16 DIAGNOSIS — G8929 Other chronic pain: Secondary | ICD-10-CM | POA: Diagnosis present

## 2020-07-16 DIAGNOSIS — R6520 Severe sepsis without septic shock: Secondary | ICD-10-CM | POA: Diagnosis present

## 2020-07-16 DIAGNOSIS — Z5320 Procedure and treatment not carried out because of patient's decision for unspecified reasons: Secondary | ICD-10-CM | POA: Diagnosis not present

## 2020-07-16 DIAGNOSIS — M545 Low back pain, unspecified: Secondary | ICD-10-CM | POA: Diagnosis present

## 2020-07-16 DIAGNOSIS — A4151 Sepsis due to Escherichia coli [E. coli]: Principal | ICD-10-CM | POA: Diagnosis present

## 2020-07-16 MED ORDER — ACETAMINOPHEN 325 MG PO TABS
650.0000 mg | ORAL_TABLET | Freq: Once | ORAL | Status: AC | PRN
  Administered 2020-07-16: 650 mg via ORAL
  Filled 2020-07-16: qty 2

## 2020-07-16 NOTE — ED Notes (Signed)
Pt is ambulatory around the lobby.

## 2020-07-16 NOTE — ED Notes (Signed)
Pt is flicking a lighter at registration. Security made aware.

## 2020-07-16 NOTE — ED Triage Notes (Signed)
Pt BIB [facility]. States that she has lower and mid back pain. Hx of homelessness and chronic back pain.

## 2020-07-17 ENCOUNTER — Inpatient Hospital Stay (HOSPITAL_COMMUNITY)
Admission: EM | Admit: 2020-07-17 | Discharge: 2020-07-21 | DRG: 871 | Disposition: A | Discharge status: Home / Self Care | Payer: Medicaid Other | Attending: Internal Medicine | Admitting: Internal Medicine

## 2020-07-17 ENCOUNTER — Other Ambulatory Visit: Payer: Self-pay

## 2020-07-17 ENCOUNTER — Emergency Department (HOSPITAL_COMMUNITY): Payer: Medicaid Other

## 2020-07-17 DIAGNOSIS — E1169 Type 2 diabetes mellitus with other specified complication: Secondary | ICD-10-CM

## 2020-07-17 DIAGNOSIS — E119 Type 2 diabetes mellitus without complications: Secondary | ICD-10-CM

## 2020-07-17 DIAGNOSIS — R6520 Severe sepsis without septic shock: Secondary | ICD-10-CM | POA: Diagnosis present

## 2020-07-17 DIAGNOSIS — F17210 Nicotine dependence, cigarettes, uncomplicated: Secondary | ICD-10-CM | POA: Diagnosis present

## 2020-07-17 DIAGNOSIS — N10 Acute tubulo-interstitial nephritis: Secondary | ICD-10-CM | POA: Diagnosis present

## 2020-07-17 DIAGNOSIS — F3132 Bipolar disorder, current episode depressed, moderate: Secondary | ICD-10-CM | POA: Diagnosis not present

## 2020-07-17 DIAGNOSIS — E669 Obesity, unspecified: Secondary | ICD-10-CM | POA: Diagnosis present

## 2020-07-17 DIAGNOSIS — U071 COVID-19: Secondary | ICD-10-CM | POA: Diagnosis present

## 2020-07-17 DIAGNOSIS — Z5900 Homelessness unspecified: Secondary | ICD-10-CM | POA: Diagnosis not present

## 2020-07-17 DIAGNOSIS — Z5320 Procedure and treatment not carried out because of patient's decision for unspecified reasons: Secondary | ICD-10-CM | POA: Diagnosis not present

## 2020-07-17 DIAGNOSIS — I10 Essential (primary) hypertension: Secondary | ICD-10-CM | POA: Diagnosis not present

## 2020-07-17 DIAGNOSIS — J45909 Unspecified asthma, uncomplicated: Secondary | ICD-10-CM | POA: Diagnosis present

## 2020-07-17 DIAGNOSIS — R7989 Other specified abnormal findings of blood chemistry: Secondary | ICD-10-CM | POA: Diagnosis not present

## 2020-07-17 DIAGNOSIS — R45851 Suicidal ideations: Secondary | ICD-10-CM | POA: Diagnosis present

## 2020-07-17 DIAGNOSIS — G8929 Other chronic pain: Secondary | ICD-10-CM | POA: Diagnosis present

## 2020-07-17 DIAGNOSIS — Z7984 Long term (current) use of oral hypoglycemic drugs: Secondary | ICD-10-CM | POA: Diagnosis not present

## 2020-07-17 DIAGNOSIS — Z794 Long term (current) use of insulin: Secondary | ICD-10-CM | POA: Diagnosis not present

## 2020-07-17 DIAGNOSIS — Z7951 Long term (current) use of inhaled steroids: Secondary | ICD-10-CM | POA: Diagnosis not present

## 2020-07-17 DIAGNOSIS — Z781 Physical restraint status: Secondary | ICD-10-CM | POA: Diagnosis not present

## 2020-07-17 DIAGNOSIS — N179 Acute kidney failure, unspecified: Secondary | ICD-10-CM

## 2020-07-17 DIAGNOSIS — A4151 Sepsis due to Escherichia coli [E. coli]: Secondary | ICD-10-CM | POA: Diagnosis present

## 2020-07-17 DIAGNOSIS — E1165 Type 2 diabetes mellitus with hyperglycemia: Secondary | ICD-10-CM | POA: Diagnosis present

## 2020-07-17 DIAGNOSIS — E1110 Type 2 diabetes mellitus with ketoacidosis without coma: Secondary | ICD-10-CM

## 2020-07-17 DIAGNOSIS — F419 Anxiety disorder, unspecified: Secondary | ICD-10-CM | POA: Diagnosis present

## 2020-07-17 DIAGNOSIS — IMO0002 Reserved for concepts with insufficient information to code with codable children: Secondary | ICD-10-CM | POA: Diagnosis present

## 2020-07-17 DIAGNOSIS — F319 Bipolar disorder, unspecified: Secondary | ICD-10-CM | POA: Diagnosis present

## 2020-07-17 DIAGNOSIS — G4700 Insomnia, unspecified: Secondary | ICD-10-CM | POA: Diagnosis present

## 2020-07-17 DIAGNOSIS — Z79899 Other long term (current) drug therapy: Secondary | ICD-10-CM | POA: Diagnosis not present

## 2020-07-17 DIAGNOSIS — F314 Bipolar disorder, current episode depressed, severe, without psychotic features: Secondary | ICD-10-CM | POA: Diagnosis present

## 2020-07-17 DIAGNOSIS — E6609 Other obesity due to excess calories: Secondary | ICD-10-CM | POA: Diagnosis not present

## 2020-07-17 LAB — CBC WITH DIFFERENTIAL/PLATELET
Abs Immature Granulocytes: 0.02 10*3/uL (ref 0.00–0.07)
Basophils Absolute: 0.1 10*3/uL (ref 0.0–0.1)
Basophils Relative: 1 %
Eosinophils Absolute: 0.2 10*3/uL (ref 0.0–0.5)
Eosinophils Relative: 2 %
HCT: 39.8 % (ref 36.0–46.0)
Hemoglobin: 12.7 g/dL (ref 12.0–15.0)
Immature Granulocytes: 0 %
Lymphocytes Relative: 34 %
Lymphs Abs: 3.4 10*3/uL (ref 0.7–4.0)
MCH: 28.4 pg (ref 26.0–34.0)
MCHC: 31.9 g/dL (ref 30.0–36.0)
MCV: 89 fL (ref 80.0–100.0)
Monocytes Absolute: 0.8 10*3/uL (ref 0.1–1.0)
Monocytes Relative: 8 %
Neutro Abs: 5.5 10*3/uL (ref 1.7–7.7)
Neutrophils Relative %: 55 %
Platelets: 224 10*3/uL (ref 150–400)
RBC: 4.47 MIL/uL (ref 3.87–5.11)
RDW: 13.5 % (ref 11.5–15.5)
WBC: 9.9 10*3/uL (ref 4.0–10.5)
nRBC: 0 % (ref 0.0–0.2)

## 2020-07-17 LAB — CBC
HCT: 34.1 % — ABNORMAL LOW (ref 36.0–46.0)
Hemoglobin: 10.8 g/dL — ABNORMAL LOW (ref 12.0–15.0)
MCH: 28.4 pg (ref 26.0–34.0)
MCHC: 31.7 g/dL (ref 30.0–36.0)
MCV: 89.7 fL (ref 80.0–100.0)
Platelets: 168 10*3/uL (ref 150–400)
RBC: 3.8 MIL/uL — ABNORMAL LOW (ref 3.87–5.11)
RDW: 13.5 % (ref 11.5–15.5)
WBC: 7.1 10*3/uL (ref 4.0–10.5)
nRBC: 0 % (ref 0.0–0.2)

## 2020-07-17 LAB — COMPREHENSIVE METABOLIC PANEL
ALT: 15 U/L (ref 0–44)
AST: 14 U/L — ABNORMAL LOW (ref 15–41)
Albumin: 4 g/dL (ref 3.5–5.0)
Alkaline Phosphatase: 111 U/L (ref 38–126)
Anion gap: 15 (ref 5–15)
BUN: 20 mg/dL (ref 6–20)
CO2: 19 mmol/L — ABNORMAL LOW (ref 22–32)
Calcium: 9 mg/dL (ref 8.9–10.3)
Chloride: 104 mmol/L (ref 98–111)
Creatinine, Ser: 1.19 mg/dL — ABNORMAL HIGH (ref 0.44–1.00)
GFR, Estimated: 55 mL/min — ABNORMAL LOW (ref >60)
Glucose, Bld: 243 mg/dL — ABNORMAL HIGH (ref 70–99)
Potassium: 4 mmol/L (ref 3.5–5.1)
Sodium: 138 mmol/L (ref 135–145)
Total Bilirubin: 0.4 mg/dL (ref 0.3–1.2)
Total Protein: 7.2 g/dL (ref 6.5–8.1)

## 2020-07-17 LAB — BASIC METABOLIC PANEL
Anion gap: 12 (ref 5–15)
BUN: 20 mg/dL (ref 6–20)
CO2: 23 mmol/L (ref 22–32)
Calcium: 8.4 mg/dL — ABNORMAL LOW (ref 8.9–10.3)
Chloride: 104 mmol/L (ref 98–111)
Creatinine, Ser: 1.14 mg/dL — ABNORMAL HIGH (ref 0.44–1.00)
GFR, Estimated: 58 mL/min — ABNORMAL LOW (ref >60)
Glucose, Bld: 130 mg/dL — ABNORMAL HIGH (ref 70–99)
Potassium: 3.8 mmol/L (ref 3.5–5.1)
Sodium: 139 mmol/L (ref 135–145)

## 2020-07-17 LAB — PROTIME-INR
INR: 1.3 — ABNORMAL HIGH (ref 0.8–1.2)
Prothrombin Time: 15.7 seconds — ABNORMAL HIGH (ref 11.4–15.2)

## 2020-07-17 LAB — GLUCOSE, CAPILLARY
Glucose-Capillary: 168 mg/dL — ABNORMAL HIGH (ref 70–99)
Glucose-Capillary: 154 mg/dL — ABNORMAL HIGH (ref 70–99)

## 2020-07-17 LAB — URINALYSIS, ROUTINE W REFLEX MICROSCOPIC
Bilirubin Urine: NEGATIVE
Glucose, UA: 50 mg/dL — AB
Hgb urine dipstick: NEGATIVE
Ketones, ur: 5 mg/dL — AB
Nitrite: POSITIVE — AB
Protein, ur: 30 mg/dL — AB
Specific Gravity, Urine: 1.028 (ref 1.005–1.030)
WBC, UA: >50 WBC/hpf — ABNORMAL HIGH (ref 0–5)
pH: 5 (ref 5.0–8.0)

## 2020-07-17 LAB — SARS CORONAVIRUS 2 BY RT PCR (HOSPITAL ORDER, PERFORMED IN ~~LOC~~ HOSPITAL LAB): SARS Coronavirus 2: POSITIVE — AB

## 2020-07-17 LAB — LACTIC ACID, PLASMA
Lactic Acid, Venous: 3.1 mmol/L (ref 0.5–1.9)
Lactic Acid, Venous: 1.9 mmol/L (ref 0.5–1.9)

## 2020-07-17 LAB — CBG MONITORING, ED
Glucose-Capillary: 165 mg/dL — ABNORMAL HIGH (ref 70–99)
Glucose-Capillary: 178 mg/dL — ABNORMAL HIGH (ref 70–99)

## 2020-07-17 LAB — PROCALCITONIN: Procalcitonin: <0.1 ng/mL

## 2020-07-17 LAB — HEMOGLOBIN A1C
Hgb A1c MFr Bld: 9.7 % — ABNORMAL HIGH (ref 4.8–5.6)
Mean Plasma Glucose: 231.69 mg/dL

## 2020-07-17 MED ORDER — ONDANSETRON HCL 4 MG/2ML IJ SOLN: 4.0000 mg | Freq: Four times a day (QID) | INTRAMUSCULAR | Status: DC | PRN

## 2020-07-17 MED ORDER — SODIUM CHLORIDE 0.9 % IV SOLN
1.0000 g | INTRAVENOUS | Status: DC
  Administered 2020-07-17: 1 g via INTRAVENOUS
  Filled 2020-07-17: qty 10

## 2020-07-17 MED ORDER — MAGIC MOUTHWASH
15.0000 mL | Freq: Four times a day (QID) | ORAL | Status: DC
  Administered 2020-07-18 – 2020-07-20 (×10): 15 mL via ORAL
  Filled 2020-07-17 (×19): qty 15

## 2020-07-17 MED ORDER — LACTATED RINGERS IV BOLUS (SEPSIS)
1000.0000 mL | Freq: Once | INTRAVENOUS | Status: AC
  Administered 2020-07-17: 1000 mL via INTRAVENOUS

## 2020-07-17 MED ORDER — KETOROLAC TROMETHAMINE 15 MG/ML IJ SOLN
15.0000 mg | Freq: Once | INTRAMUSCULAR | Status: AC
  Administered 2020-07-17: 15 mg via INTRAVENOUS
  Filled 2020-07-17: qty 1

## 2020-07-17 MED ORDER — SODIUM CHLORIDE 0.9 % IV SOLN
1.0000 g | INTRAVENOUS | Status: DC
  Administered 2020-07-18: 1 g via INTRAVENOUS
  Filled 2020-07-17: qty 10
  Filled 2020-07-17: qty 0.06

## 2020-07-17 MED ORDER — ONDANSETRON HCL 4 MG PO TABS: 4.0000 mg | ORAL_TABLET | Freq: Four times a day (QID) | ORAL | Status: DC | PRN

## 2020-07-17 MED ORDER — METOPROLOL SUCCINATE ER 25 MG PO TB24
12.5000 mg | ORAL_TABLET | Freq: Every day | ORAL | Status: DC
Start: 2020-07-17 — End: 2020-07-21
  Administered 2020-07-17 – 2020-07-20 (×4): 12.5 mg via ORAL
  Filled 2020-07-17 (×3): qty 1
  Filled 2020-07-17: qty 0.5

## 2020-07-17 MED ORDER — LACTATED RINGERS IV SOLN: INTRAVENOUS | Status: DC

## 2020-07-17 MED ORDER — FLUTICASONE PROPIONATE 50 MCG/ACT NA SUSP
2.0000 | Freq: Every day | NASAL | Status: DC
  Administered 2020-07-17 – 2020-07-20 (×4): 2 via NASAL
  Filled 2020-07-17 (×2): qty 16

## 2020-07-17 MED ORDER — ACETAMINOPHEN 325 MG PO TABS
650.0000 mg | ORAL_TABLET | Freq: Four times a day (QID) | ORAL | Status: DC | PRN
  Administered 2020-07-17 – 2020-07-20 (×7): 650 mg via ORAL
  Filled 2020-07-17 (×7): qty 2

## 2020-07-17 MED ORDER — SERTRALINE HCL 50 MG PO TABS
150.0000 mg | ORAL_TABLET | Freq: Every day | ORAL | Status: DC
  Administered 2020-07-17 – 2020-07-20 (×4): 150 mg via ORAL
  Filled 2020-07-17 (×3): qty 1
  Filled 2020-07-17: qty 3

## 2020-07-17 MED ORDER — VITAMIN D 25 MCG (1000 UNIT) PO TABS
10000.0000 [IU] | ORAL_TABLET | Freq: Every day | ORAL | Status: DC
  Administered 2020-07-17 – 2020-07-20 (×4): 10000 [IU] via ORAL
  Filled 2020-07-17 (×3): qty 10

## 2020-07-17 MED ORDER — INSULIN ASPART 100 UNIT/ML ~~LOC~~ SOLN
0.0000 [IU] | Freq: Three times a day (TID) | SUBCUTANEOUS | Status: DC
  Administered 2020-07-17 (×2): 3 [IU] via SUBCUTANEOUS
  Administered 2020-07-18: 2 [IU] via SUBCUTANEOUS
  Administered 2020-07-18: 3 [IU] via SUBCUTANEOUS
  Administered 2020-07-18: 5 [IU] via SUBCUTANEOUS
  Administered 2020-07-18 – 2020-07-19 (×2): 8 [IU] via SUBCUTANEOUS
  Administered 2020-07-19: 5 [IU] via SUBCUTANEOUS
  Administered 2020-07-19: 15 [IU] via SUBCUTANEOUS
  Administered 2020-07-20: 5 [IU] via SUBCUTANEOUS
  Administered 2020-07-20: 8 [IU] via SUBCUTANEOUS
  Administered 2020-07-20: 3 [IU] via SUBCUTANEOUS
  Administered 2020-07-20: 11 [IU] via SUBCUTANEOUS
  Filled 2020-07-17: qty 0.15

## 2020-07-17 MED ORDER — PROMETHAZINE HCL 25 MG PO TABS
12.5000 mg | ORAL_TABLET | Freq: Three times a day (TID) | ORAL | Status: DC | PRN
  Administered 2020-07-17 – 2020-07-20 (×3): 25 mg via ORAL
  Filled 2020-07-17 (×3): qty 1

## 2020-07-17 MED ORDER — ADULT MULTIVITAMIN W/MINERALS CH
1.0000 | ORAL_TABLET | Freq: Every day | ORAL | Status: DC
  Administered 2020-07-17 – 2020-07-20 (×4): 1 via ORAL
  Filled 2020-07-17 (×4): qty 1

## 2020-07-17 MED ORDER — MAGNESIUM HYDROXIDE 400 MG/5ML PO SUSP
30.0000 mL | Freq: Every day | ORAL | Status: DC | PRN
  Administered 2020-07-18: 30 mL via ORAL
  Filled 2020-07-17: qty 30

## 2020-07-17 MED ORDER — ACETAMINOPHEN 650 MG RE SUPP: 650.0000 mg | Freq: Four times a day (QID) | RECTAL | Status: DC | PRN

## 2020-07-17 MED ORDER — TRAZODONE HCL 100 MG PO TABS: 50.0000 mg | ORAL_TABLET | Freq: Every evening | ORAL | Status: DC | PRN

## 2020-07-17 MED ORDER — RISAQUAD PO CAPS
1.0000 | ORAL_CAPSULE | Freq: Every day | ORAL | Status: DC
  Administered 2020-07-17 – 2020-07-20 (×4): 1 via ORAL
  Filled 2020-07-17 (×5): qty 1

## 2020-07-17 MED ORDER — LACTATED RINGERS IV BOLUS (SEPSIS)
500.0000 mL | Freq: Once | INTRAVENOUS | Status: AC
  Administered 2020-07-17: 500 mL via INTRAVENOUS

## 2020-07-17 MED ORDER — PROBIOTIC ACIDOPHILUS PO CAPS: 1.0000 | ORAL_CAPSULE | Freq: Every day | ORAL | Status: DC

## 2020-07-17 MED ORDER — ENOXAPARIN SODIUM 40 MG/0.4ML ~~LOC~~ SOLN
40.0000 mg | SUBCUTANEOUS | Status: DC
  Administered 2020-07-17 – 2020-07-20 (×4): 40 mg via SUBCUTANEOUS
  Filled 2020-07-17 (×4): qty 0.4

## 2020-07-17 MED ORDER — APPLE CIDER VINEGAR 500 MG PO TABS: ORAL_TABLET | Freq: Three times a day (TID) | ORAL | Status: DC

## 2020-07-17 MED ORDER — LORAZEPAM 2 MG/ML IJ SOLN
0.5000 mg | Freq: Once | INTRAMUSCULAR | Status: AC
  Administered 2020-07-17: 0.5 mg via INTRAVENOUS
  Filled 2020-07-17: qty 1

## 2020-07-17 MED ORDER — TRAZODONE HCL 50 MG PO TABS
25.0000 mg | ORAL_TABLET | Freq: Every evening | ORAL | Status: DC | PRN
  Administered 2020-07-17 – 2020-07-20 (×3): 25 mg via ORAL
  Filled 2020-07-17 (×3): qty 1

## 2020-07-17 MED ORDER — MOMETASONE FURO-FORMOTEROL FUM 200-5 MCG/ACT IN AERO
2.0000 | INHALATION_SPRAY | Freq: Two times a day (BID) | RESPIRATORY_TRACT | Status: DC
  Administered 2020-07-17 – 2020-07-20 (×6): 2 via RESPIRATORY_TRACT
  Filled 2020-07-17 (×3): qty 8.8

## 2020-07-17 NOTE — ED Notes (Signed)
Pt continues to be uncooperative, wandering around room, throwing things on floor, cursing at staff and trying to go through drawers in room. Pt placed in non violent restraints at wrist.

## 2020-07-17 NOTE — Progress Notes (Addendum)
Pt. Admitted to the unit via stretcher accompanied by ED Staff. Pt. Is on 2 pt. Restraint, rambling speech. Upon assessment pt, is alert and oriented to person, place and day/year. Pt. Is continously restless and manic with flight of ideas but can easily redirected.Vital sign taken and recorded. Needs attended to.

## 2020-07-17 NOTE — ED Notes (Signed)
Pt called out for restroom assistance, staff informed pt someone would be with her shortly and would bring her a bedside commode. This writer brought commode and supplies to pt's room and pt stated that she had already used the restroom in the bed. This Clinical research associate assisted pt to sit on bedside commode so pt's bed could be cleaned and linen changed. Bed was cleaned and wiped, linen was changed, and pt was offered assistance to clean herself up and get back into bed. Pt began yelling and shouting at this writer stating that she "couldn't walk" and that everyone has been ignoring her. Pt then asked to be left alone in order to use the restroom, however she demanded to see "the doctor from last night" and when this writer informed her that that provider was no longer working in the dept due to shift change pt shouted "Then what the fuck are you even doing bitch? Get the fuck out of my room, fat ass." Pt refused all further offers of assistance and continued to yell and curse at staff.
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
## 2020-07-17 NOTE — ED Notes (Signed)
Pt called the ED phone and states that something is "hot" up under her and that she wants to be brought to the back.

## 2020-07-17 NOTE — ED Notes (Addendum)
Patient found going through the drawers in room. Patient states, "My fucking back hurts, y'all aren't doing anything for it." Patient instructed that she is not allowed to go through drawers. Patient noted to have gotten a packet of surgilube and was rubbing it on her back to "help her pain" per patient. Drawers and cabinets locked.

## 2020-07-17 NOTE — ED Notes (Signed)
Patient exited room with IV pump in hand. Patient noted to be standing in a moderate amount of blood. Patient noted to have torn her IV tubing line in half, and was bleeding from the IV. Patient's IV still in tact. Upon asking patient what she was trying to do, patient states, "I'm in pain, and the pump is just going beep beep beep and everyone is fucking ignoring me. I'm still in pain." Informed patient that her call light has not gone off within the last hour, so we were unaware of her need. Patient continues to curse and threaten staff. Patient continues to call staff, "bitch" and states, "Fuck you, you bitch. Get the fuck out of my room." Informed patient I was cleaning her blood off the floor and that I would not leave the room. Patient continues to be aggressive and belligerent.

## 2020-07-17 NOTE — Progress Notes (Signed)
PT Cancellation Note / Screen  Patient Details Name: [name] [name] MRN: [id number] DOB: 1971-05-09  Pt in ED for UTI with severe sepsis and Covid positive.  Per notes, pt ambulated into hallway to nurses station demanding coffee and undressed.  Pt sitting in corner of room on stool on therapist's arrival and states she is missing money.  Pt with shaky UEs and reports just wanting a cigarette.  Pt also mentioned wanting to roll a joint.  Pt appears to be mobilizing ad lib at this time in room.  PT to sign off at this time however please reorder if needs change.  Delmus Warwick,[name] 07/17/2020, 3:13 PM Thomasene Mohair PT, DPT [facility] Pager: [phone] Office: [phone]

## 2020-07-17 NOTE — ED Notes (Signed)
Pt states that she will "shit in our waiting room chairs."

## 2020-07-17 NOTE — ED Notes (Signed)
Patient stormed into the hall screaming that she wants some "fucking coffee" and calling the staff "bitches."  Patient then ripped gown off and was standing in the hall naked from the waist down.  Security called.
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
## 2020-07-17 NOTE — ED Notes (Addendum)
This nurse found patient yelling and cursing at staff while they cleaned her blood off the floor/patient stating she ripped her IV line because the pump was beeping. Patient advised not to speak to staff in that manner. Patients behavior does not change. Explained the need for a Covid-19 swab for admission but continues to refuse while calling staff bitches

## 2020-07-17 NOTE — ED Notes (Signed)
Patient came through the triage doors behind another patient yelling that she has a urine sample in her emesis bag and we need to test it, patient threw the bag behind the nurses station. Patient verbally aggressive to staff, advised to refrain from throwing bloodily fluids and using vulgar language.

## 2020-07-17 NOTE — Progress Notes (Signed)
Same day note  Patient seen and examined at bedside.  Patient was admitted to the hospital for back pain  At the time of my evaluation, patient complains of vomiting after eating, mild abdominal discomfort.  Physical examination reveals anxious female with mild costovertebral angle tenderness  Laboratory data and imaging was reviewed  Assessment and Plan.  UTI with severe sepsis.  Concerning for acute pyelonephritis.  Patient was febrile and tachycardic on presentation with elevated lactate and acute kidney injury with possible source of infection as pyelonephritis.  Continue IV Rocephin IV hydration follow blood cultures urine culture.  Patient had elevated lactic at 3.1 which is improved to 1.9 with hydration and antiemetic.  Abnormal urinalysis  Acute kidney injury.  Continue IV fluid hydration.  Monitor BMP.  Generalized weakness secondary to sepsis.  PT consultation.  Diabetes mellitus type 2 with hyperglycemia.  Hold Metformin.  Continue sliding scale insulin, hemoglobin A1c of 9.7.  Essential hypertension.  Continue Toprol-XL.  Monitor blood pressure closely  Anxiety/bipolar, depression/schizophrenia.  Continue Zoloft trazodone.  Homelessness consult transition of care.  No Charge  Signed,  Tenny Craw, MD [facility]

## 2020-07-17 NOTE — ED Notes (Addendum)
Pt continues to be uncooperative with vitals and meds.

## 2020-07-17 NOTE — ED Notes (Signed)
Pt told to go outside because she was trying to smoke in the lobby.

## 2020-07-17 NOTE — ED Notes (Signed)
This nurse was in another pt's room while this pt ripped her IV line in half because the pump was beeping due to low battery. Pt calling nurses names and refusing care.

## 2020-07-17 NOTE — ED Notes (Signed)
LUNCH TRAY GIVEN. 
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
## 2020-07-17 NOTE — ED Notes (Signed)
PT REFUSED CBG MONITORING WILL TRY AGAIN LATER.

## 2020-07-17 NOTE — ED Notes (Signed)
Admitting Dr in to talk with pt, pt without clothes other than a under top on. She was provided with a gown. After he spoke with he she told him she will allow a Covid swab to obtained.

## 2020-07-17 NOTE — Care Plan (Signed)
Was paged that patient is agitated possibly Manic  Behavioral consult in Am Ativan for now given worsening agitation 9:38 PM [name] [name]

## 2020-07-17 NOTE — ED Notes (Signed)
Pt refusing Covid swab, 2nd set of blood cultures, and vitals. Pt calling this nurse a "bitch" and stating "get the fuck out of my room bitch"

## 2020-07-17 NOTE — ED Notes (Signed)
Pt ran through the triage doors and threw a vomit back of urine behind the nurse's station. No sign of back pain, ambulating well.

## 2020-07-17 NOTE — ED Notes (Signed)
Date and time results received: 07/17/20 2:25   Test: Lactic  Critical Value: 3.1  Name of Provider Notified: Dr. Bebe Shaggy

## 2020-07-17 NOTE — ED Notes (Signed)
This writer returned to pt's room in order to check on her, pt was standing up wiping herself with bleach wipes. This Clinical research associate urgently informed pt that those wipes are absolutely not skin safe, pt responded "I know what I'm doing bitch, do you know what you're doing?" This writer attempted to assist pt with hygiene needs however pt refused help and refused to have bleach wipes removed and replaced with a safe alternative. During this interaction, pt alternated between mumbling and shouting with little to no cohesive communication outside of aggressive language towards staff. RN informed.

## 2020-07-18 DIAGNOSIS — F3132 Bipolar disorder, current episode depressed, moderate: Secondary | ICD-10-CM | POA: Diagnosis not present

## 2020-07-18 DIAGNOSIS — E1165 Type 2 diabetes mellitus with hyperglycemia: Secondary | ICD-10-CM | POA: Diagnosis not present

## 2020-07-18 DIAGNOSIS — N10 Acute tubulo-interstitial nephritis: Secondary | ICD-10-CM | POA: Diagnosis not present

## 2020-07-18 DIAGNOSIS — I10 Essential (primary) hypertension: Secondary | ICD-10-CM | POA: Diagnosis not present

## 2020-07-18 DIAGNOSIS — E6609 Other obesity due to excess calories: Secondary | ICD-10-CM

## 2020-07-18 DIAGNOSIS — Z5900 Homelessness unspecified: Secondary | ICD-10-CM

## 2020-07-18 LAB — CBC
HCT: 34.5 % — ABNORMAL LOW (ref 36.0–46.0)
Hemoglobin: 10.7 g/dL — ABNORMAL LOW (ref 12.0–15.0)
MCH: 28.5 pg (ref 26.0–34.0)
MCHC: 31 g/dL (ref 30.0–36.0)
MCV: 91.8 fL (ref 80.0–100.0)
Platelets: 158 10*3/uL (ref 150–400)
RBC: 3.76 MIL/uL — ABNORMAL LOW (ref 3.87–5.11)
RDW: 13.5 % (ref 11.5–15.5)
WBC: 5.4 10*3/uL (ref 4.0–10.5)
nRBC: 0 % (ref 0.0–0.2)

## 2020-07-18 LAB — COMPREHENSIVE METABOLIC PANEL
ALT: 12 U/L (ref 0–44)
AST: 9 U/L — ABNORMAL LOW (ref 15–41)
Albumin: 3 g/dL — ABNORMAL LOW (ref 3.5–5.0)
Alkaline Phosphatase: 79 U/L (ref 38–126)
Anion gap: 10 (ref 5–15)
BUN: 14 mg/dL (ref 6–20)
CO2: 25 mmol/L (ref 22–32)
Calcium: 8.3 mg/dL — ABNORMAL LOW (ref 8.9–10.3)
Chloride: 106 mmol/L (ref 98–111)
Creatinine, Ser: 0.89 mg/dL (ref 0.44–1.00)
GFR, Estimated: >60 mL/min (ref >60)
Glucose, Bld: 339 mg/dL — ABNORMAL HIGH (ref 70–99)
Potassium: 3.9 mmol/L (ref 3.5–5.1)
Sodium: 141 mmol/L (ref 135–145)
Total Bilirubin: 0.4 mg/dL (ref 0.3–1.2)
Total Protein: 5.3 g/dL — ABNORMAL LOW (ref 6.5–8.1)

## 2020-07-18 LAB — GLUCOSE, CAPILLARY
Glucose-Capillary: 251 mg/dL — ABNORMAL HIGH (ref 70–99)
Glucose-Capillary: 229 mg/dL — ABNORMAL HIGH (ref 70–99)
Glucose-Capillary: 177 mg/dL — ABNORMAL HIGH (ref 70–99)
Glucose-Capillary: 121 mg/dL — ABNORMAL HIGH (ref 70–99)

## 2020-07-18 LAB — RAPID URINE DRUG SCREEN, HOSP PERFORMED
Amphetamines: NOT DETECTED
Barbiturates: NOT DETECTED
Benzodiazepines: NOT DETECTED
Cocaine: NOT DETECTED
Opiates: NOT DETECTED
Tetrahydrocannabinol: NOT DETECTED

## 2020-07-18 LAB — MAGNESIUM: Magnesium: 1.5 mg/dL — ABNORMAL LOW (ref 1.7–2.4)

## 2020-07-18 LAB — HIV ANTIBODY (ROUTINE TESTING W REFLEX): HIV Screen 4th Generation wRfx: NONREACTIVE

## 2020-07-18 LAB — LACTIC ACID, PLASMA: Lactic Acid, Venous: 1.8 mmol/L (ref 0.5–1.9)

## 2020-07-18 LAB — PHOSPHORUS: Phosphorus: 4.6 mg/dL (ref 2.5–4.6)

## 2020-07-18 MED ORDER — HALOPERIDOL LACTATE 5 MG/ML IJ SOLN
2.0000 mg | Freq: Once | INTRAMUSCULAR | Status: AC
  Administered 2020-07-18: 2 mg via INTRAVENOUS
  Filled 2020-07-18: qty 1

## 2020-07-18 MED ORDER — RISPERIDONE 0.25 MG PO TABS
0.5000 mg | ORAL_TABLET | Freq: Two times a day (BID) | ORAL | Status: DC
  Administered 2020-07-18 – 2020-07-20 (×4): 0.5 mg via ORAL
  Filled 2020-07-18 (×4): qty 2

## 2020-07-18 MED ORDER — MAGNESIUM SULFATE 2 GM/50ML IV SOLN
2.0000 g | Freq: Once | INTRAVENOUS | Status: AC
  Administered 2020-07-18: 2 g via INTRAVENOUS
  Filled 2020-07-18: qty 50

## 2020-07-18 NOTE — Consult Note (Signed)
Telepsych Consultation   Reason for Consult:  Manic Referring Physician: Dr. Tyson Babinski Location of Patient: [id number] Location of Provider: [facility]  Patient Identification: [name] [name] MRN:  [id number] Principal Diagnosis: Acute pyelonephritis Diagnosis:  Principal Problem:   Acute pyelonephritis Active Problems:   OBESITY   Essential hypertension   Diabetes (HCC)   [name] [name] with moderate depression (HCC)   [name], [name], [name]   Homelessness   Total Time spent with patient: 1 hour  Subjective:   [name] [name] is a 50 y.o. female patient admitted with pyelnephritis.   HPI:   [name] [name], [name] [name], [name] [name], [name] [name], [name] [name],  presented to the hospital with acute onset of bilateral flank and mid back pain with urinary urgency frequency and dysuria.  She also had fever and chills as well.  In the ED patient was noted to have temperature 100.4 and heart rate 104. Labs revealed hyperglycemia of 243, BUN of 20 with creatinine 1.19. Lactic acid was 3.1 and CBC was within normal. UA was positive for UTI. Urine and blood culture was sent. Chest x-ray showed no acute cardiopulmonary disease. The patient was given 650 mg p.o. Tylenol and 50 mg of IV Toradol, 1/2 L bolus of IV lactated Ringer 150 mL/h in addition to IV Rocephin.   Patient was then admitted to hospital for further evaluation and treatment.   On interview, Ms. [name], [name] wouldn't give me nothing for pain. I had to do something to get some medicines. They dont believe me im hurting." She denies a history of suicide attempts. She denies HI or AVH. She does admit to seeing "intermittent flashes." She feels paranoid at times that people are judging her and she hallucinations of seeing multiple doctors in her room.  She was diagnosed with [name] [name] in  her 73s. She reports last feeling manic in December.  She does not remember the last time she had her medications. She reports problems with sleep. She is very well known to our service, as she has had 8 ED in the past 2 months for aggressive behavior, homelessness, and mania. She is calm and cooperative, currently showing no signs of mania. She does not appear to be responding to internal or external stimuli at this time. At present there is no current psychosis but she states " I see the physician here and here and over here when I look this way. He just want leave me alone. " Patient is able to converse, appears coherent, and with linear thought process. She denies any signs of depression at this time to include suicidal thoughts. She is not exhibiting any psychomotor agitation, and appears to be back at baseline. SHe does report that her biggest concern at this time is getting pain medications. Historical chart review shows a history of receiving Butrans patches, Oxycodone, and zolpidem from a Dr. Mikael Spray at [facility]. UDS not obtained on admission, unable to assess for malingering behaviors at this time, previous drug screens obtained have been normal. She denies suicidal ideations and homicidal ideations at this time.   Past Psychiatric History: [name] [name]  Risk to Self:   No   Risk to Others:  None. Denies HI.  Prior Inpatient Therapy:  She was hospitalized at [facility] in 05/2018, 12/2018 for mania with psychosis.  Prior Outpatient Therapy:  She is followed at [facility]   Past Medical History:  Past Medical History:  Diagnosis Date  . [name]   . [name]   . [name] affective [name] (HCC)   . Depression   . [name]   . [name]   . [name] [name] [facility])     Past Surgical History:  Procedure Laterality Date  . KNEE ARTHROSCOPY     x 2  . KNEE SURGERY Left    Incision made and knee cleaned out  . TUBAL LIGATION     Family History:   Family History  Problem Relation Age of Onset  . [name] [name] Mother   . Hypertension Father   . Diabetes Father    Family Psychiatric  History: [name] [name]-mother, [name]  Social History:  Social History   Substance and Sexual Activity  Alcohol Use Not Currently   Comment: occasionally     Social History   Substance and Sexual Activity  Drug Use No    Social History   Socioeconomic History  . Marital status: Divorced    Spouse name: Not on file  . Number of children: Not on file  . Years of education: Not on file  . Highest education level: Not on file  Occupational History  . Not on file  Tobacco Use  . Smoking status: Current Every Day Smoker    Packs/day: 1.00    Years: 29.00    Pack years: 29.00    Types: Cigarettes  . Smokeless tobacco: Never Used  Vaping Use  . Vaping Use: Former  Substance and Sexual Activity  . Alcohol use: Not Currently    Comment: occasionally  . Drug use: No  . Sexual activity: Yes    Birth control/protection: Surgical  Other Topics Concern  . Not on file  Social History Narrative   Patient lives in [place] with her 2 kids- 85 in 35 years old.   She has total 4 kids.   She is a single mom.         Social Determinants of Health   Financial Resource Strain: Not on file  Food Insecurity: Not on file  Transportation Needs: Not on file  Physical Activity: Not on file  Stress: Not on file  Social Connections: Not on file   Additional Social History: She lives alone. She is single. She has 4 adult children. She does not have much contact with her children. She denies alcohol or illicit substance use.     Allergies:   Allergies  Allergen Reactions  . Tramadol Nausea Only    "feeling like I'm on fire"  . Nicotine Rash    Patient states she is allergic to the Nicotine patch and they cause her to break out in a rash.  She states she can smoke  cigarettes.    Labs:  Results for orders placed or performed during the hospital encounter of 07/17/20 (from the past 48 hour(s))  Urinalysis, Routine w reflex microscopic Urine, Clean Catch     Status: Abnormal   Collection Time: 07/17/20 12:51 AM  Result Value Ref Range   Color, Urine AMBER (A) YELLOW    Comment: BIOCHEMICALS MAY BE AFFECTED BY COLOR   APPearance CLOUDY (A) CLEAR   Specific Gravity, Urine 1.028 1.005 - 1.030   pH 5.0 5.0 - 8.0   Glucose, UA 50 (A) NEGATIVE mg/dL   Hgb urine dipstick NEGATIVE NEGATIVE   Bilirubin Urine NEGATIVE NEGATIVE   Ketones, ur 5 (A) NEGATIVE mg/dL   Protein, ur 30 (  A) NEGATIVE mg/dL   Nitrite POSITIVE (A) NEGATIVE   Leukocytes,Ua LARGE (A) NEGATIVE   RBC / HPF 0-5 0 - 5 RBC/hpf   WBC, UA >50 (H) 0 - 5 WBC/hpf   Bacteria, UA RARE (A) NONE SEEN   Squamous Epithelial / LPF 6-10 0 - 5   WBC Clumps PRESENT    Mucus PRESENT    Hyaline Casts, UA PRESENT    Non Squamous Epithelial 0-5 (A) NONE SEEN    Comment: Performed at [facility], [address], [place], [place] [address]  Urine culture     Status: Abnormal (Preliminary result)   Collection Time: 07/17/20 12:51 AM   Specimen: In/Out Cath Urine  Result Value Ref Range   Specimen Description      IN/OUT CATH URINE Performed at [facility], [address] [place], [place] [address]    Special Requests      NONE Performed at [facility], [address], [place], [place] [address]    Culture (A)     >=100,000 COLONIES/mL ESCHERICHIA COLI SUSCEPTIBILITIES TO FOLLOW Performed at [facility], [address], [place], [place] [address]    Report Status PENDING   CBC with Differential/Platelet     Status: None   Collection Time: 07/17/20  1:05 AM  Result Value Ref Range   WBC 9.9 4.0 - 10.5 K/uL   RBC 4.47 3.87 - 5.11 MIL/uL   Hemoglobin 12.7 12.0 - 15.0 g/dL   HCT 74.2 59.5 - 63.8 %   MCV 89.0 80.0 - 100.0 fL   MCH  28.4 26.0 - 34.0 pg   MCHC 31.9 30.0 - 36.0 g/dL   RDW 75.6 43.3 - 29.5 %   Platelets 224 150 - 400 K/uL   nRBC 0.0 0.0 - 0.2 %   Neutrophils Relative % 55 %   Neutro Abs 5.5 1.7 - 7.7 K/uL   Lymphocytes Relative 34 %   Lymphs Abs 3.4 0.7 - 4.0 K/uL   Monocytes Relative 8 %   Monocytes Absolute 0.8 0.1 - 1.0 K/uL   Eosinophils Relative 2 %   Eosinophils Absolute 0.2 0.0 - 0.5 K/uL   Basophils Relative 1 %   Basophils Absolute 0.1 0.0 - 0.1 K/uL   Immature Granulocytes 0 %   Abs Immature Granulocytes 0.02 0.00 - 0.07 K/uL    Comment: Performed at [facility], [address] [place], [place] [address]  Comprehensive metabolic panel     Status: Abnormal   Collection Time: 07/17/20  1:05 AM  Result Value Ref Range   Sodium 138 135 - 145 mmol/L   Potassium 4.0 3.5 - 5.1 mmol/L   Chloride 104 98 - 111 mmol/L   CO2 19 (L) 22 - 32 mmol/L   Glucose, Bld 243 (H) 70 - 99 mg/dL    Comment: Glucose reference range applies only to samples taken after fasting for at least 8 hours.   BUN 20 6 - 20 mg/dL   Creatinine, Ser 6.60 (H) 0.44 - 1.00 mg/dL   Calcium 9.0 8.9 - 63.0 mg/dL   Total Protein 7.2 6.5 - 8.1 g/dL   Albumin 4.0 3.5 - 5.0 g/dL   AST 14 (L) 15 - 41 U/L   ALT 15 0 - 44 U/L   Alkaline Phosphatase 111 38 - 126 U/L   Total Bilirubin 0.4 0.3 - 1.2 mg/dL   GFR, Estimated 55 (L) >60 mL/min    Comment: (NOTE)  Calculated using the CKD-EPI Creatinine Equation (2021)    Anion gap 15 5 - 15    Comment: Performed at [facility], [address], [place], [place] [address]  Lactic acid, plasma     Status: Abnormal   Collection Time: 07/17/20  1:06 AM  Result Value Ref Range   Lactic Acid, Venous 3.1 (HH) 0.5 - 1.9 mmol/L    Comment: CRITICAL RESULT CALLED TO, READ BACK BY AND VERIFIED WITH: CAMERON RN 07/17/20 @0223  BY P.HENDERSON Performed at [facility], [address], [place], [place] [address]   Blood Culture (routine x  2)     Status: None (Preliminary result)   Collection Time: 07/17/20  2:38 AM   Specimen: BLOOD  Result Value Ref Range   Specimen Description      BLOOD SITE NOT SPECIFIED Performed at [facility], [address], [place], [place] [address]    Special Requests      BOTTLES DRAWN AEROBIC AND ANAEROBIC Blood Culture adequate volume Performed at [facility], [address], [place], [place] [address]    Culture      NO GROWTH 1 DAY Performed at [facility], [address] [place], [place] [address]    Report Status PENDING   Protime-INR     Status: Abnormal   Collection Time: 07/17/20  5:31 AM  Result Value Ref Range   Prothrombin Time 15.7 (H) 11.4 - 15.2 seconds   INR 1.3 (H) 0.8 - 1.2    Comment: (NOTE) INR goal varies based on device and disease states. Performed at [facility], [address], [place], [place] [address]   Procalcitonin     Status: None   Collection Time: 07/17/20  5:31 AM  Result Value Ref Range   Procalcitonin <0.10 ng/mL    Comment:        Interpretation: PCT (Procalcitonin) <= 0.5 ng/mL: Systemic infection (sepsis) is not likely. Local bacterial infection is possible. (NOTE)       Sepsis PCT Algorithm           Lower Respiratory Tract                                      Infection PCT Algorithm    ----------------------------     ----------------------------         PCT < 0.25 ng/mL                PCT < 0.10 ng/mL          Strongly encourage             Strongly discourage   discontinuation of antibiotics    initiation of antibiotics    ----------------------------     -----------------------------       PCT 0.25 - 0.50 ng/mL            PCT 0.10 - 0.25 ng/mL               OR       >80% decrease in PCT            Discourage initiation of  antibiotics      Encourage discontinuation           of antibiotics    ----------------------------      -----------------------------         PCT >= 0.50 ng/mL              PCT 0.26 - 0.50 ng/mL               AND        <80% decrease in PCT             Encourage initiation of                                             antibiotics       Encourage continuation           of antibiotics    ----------------------------     -----------------------------        PCT >= 0.50 ng/mL                  PCT > 0.50 ng/mL               AND         increase in PCT                  Strongly encourage                                      initiation of antibiotics    Strongly encourage escalation           of antibiotics                                     -----------------------------                                           PCT <= 0.25 ng/mL                                                 OR                                        > 80% decrease in PCT                                      Discontinue / Do not initiate                                             antibiotics  Performed at [facility], [address] [place], [place] [address]   Basic metabolic panel     Status: Abnormal  Collection Time: 07/17/20  5:31 AM  Result Value Ref Range   Sodium 139 135 - 145 mmol/L   Potassium 3.8 3.5 - 5.1 mmol/L   Chloride 104 98 - 111 mmol/L   CO2 23 22 - 32 mmol/L   Glucose, Bld 130 (H) 70 - 99 mg/dL    Comment: Glucose reference range applies only to samples taken after fasting for at least 8 hours.   BUN 20 6 - 20 mg/dL   Creatinine, Ser 2.99 (H) 0.44 - 1.00 mg/dL   Calcium 8.4 (L) 8.9 - 10.3 mg/dL   GFR, Estimated 58 (L) >60 mL/min    Comment: (NOTE) Calculated using the CKD-EPI Creatinine Equation (2021)    Anion gap 12 5 - 15    Comment: Performed at [facility], [address], [place], [place] [address]  CBC     Status: Abnormal   Collection Time: 07/17/20  5:31 AM  Result Value Ref Range   WBC 7.1 4.0 - 10.5 K/uL   RBC 3.80 (L) 3.87 - 5.11  MIL/uL   Hemoglobin 10.8 (L) 12.0 - 15.0 g/dL   HCT 67.8 (L) 93.8 - 10.1 %   MCV 89.7 80.0 - 100.0 fL   MCH 28.4 26.0 - 34.0 pg   MCHC 31.7 30.0 - 36.0 g/dL   RDW 75.1 02.5 - 85.2 %   Platelets 168 150 - 400 K/uL   nRBC 0.0 0.0 - 0.2 %    Comment: Performed at [facility], [address] [place], [place] [address]  Hemoglobin A1c     Status: Abnormal   Collection Time: 07/17/20  5:31 AM  Result Value Ref Range   Hgb A1c MFr Bld 9.7 (H) 4.8 - 5.6 %    Comment: (NOTE) Pre diabetes:          5.7%-6.4%  Diabetes:              >6.4%  Glycemic control for   <7.0% adults with diabetes    Mean Plasma Glucose 231.69 mg/dL    Comment: Performed at [facility], [address], [place], [place] [address]  Lactic acid, plasma     Status: None   Collection Time: 07/17/20  5:31 AM  Result Value Ref Range   Lactic Acid, Venous 1.9 0.5 - 1.9 mmol/L    Comment: Performed at [facility], [address], [place], [place] [address]  CBG monitoring, ED     Status: Abnormal   Collection Time: 07/17/20  7:39 AM  Result Value Ref Range   Glucose-Capillary 165 (H) 70 - 99 mg/dL    Comment: Glucose reference range applies only to samples taken after fasting for at least 8 hours.  SARS Coronavirus 2 by RT PCR (hospital order, performed in [facility] hospital lab) Nasopharyngeal Nasopharyngeal Swab     Status: Abnormal   Collection Time: 07/17/20 11:47 AM   Specimen: Nasopharyngeal Swab  Result Value Ref Range   SARS Coronavirus 2 POSITIVE (A) NEGATIVE    Comment: RESULT CALLED TO, READ BACK BY AND VERIFIED WITH: DOWD,P. RN @1436  ON 02.07.2022 BY COHEN,K (NOTE) SARS-CoV-2 target nucleic acids are DETECTED  SARS-CoV-2 RNA is generally detectable in upper respiratory specimens  during the acute phase of infection.  Positive results are indicative  of the presence of the identified virus, but do not rule out bacterial infection or co-infection with  other pathogens not detected by the test.  Clinical correlation with patient history  and  other diagnostic information is necessary to determine patient infection status.  The expected result is negative.  Fact Sheet for Patients:   BoilerBrush.com.cyhttps://www.fda.gov/media/136312/download   Fact Sheet for Healthcare Providers:   https://pope.com/https://www.fda.gov/media/136313/download    This test is not yet approved or cleared by the [place] FDA and  has been authorized for detection and/or diagnosis of SARS-CoV-2 by FDA under an Emergency Use Authorization (EUA).  This EUA will remain in effect (meaning  this test can be used) for the duration of  the COVID-19 declaration under Section 564(b)(1) of the Act, 21 U.S.C. section 360-bbb-3(b)(1), unless the authorization is terminated or revoked sooner.  Performed at [facility], [address] [place], [place] [address]   CBG monitoring, ED     Status: Abnormal   Collection Time: 07/17/20  4:23 PM  Result Value Ref Range   Glucose-Capillary 178 (H) 70 - 99 mg/dL    Comment: Glucose reference range applies only to samples taken after fasting for at least 8 hours.  Glucose, capillary     Status: Abnormal   Collection Time: 07/17/20  5:36 PM  Result Value Ref Range   Glucose-Capillary 168 (H) 70 - 99 mg/dL    Comment: Glucose reference range applies only to samples taken after fasting for at least 8 hours.  Glucose, capillary     Status: Abnormal   Collection Time: 07/17/20  9:49 PM  Result Value Ref Range   Glucose-Capillary 154 (H) 70 - 99 mg/dL    Comment: Glucose reference range applies only to samples taken after fasting for at least 8 hours.  HIV Antibody (routine testing w rflx)     Status: None   Collection Time: 07/18/20  3:44 AM  Result Value Ref Range   HIV Screen 4th Generation wRfx Non Reactive Non Reactive    Comment: Performed at [facility], [address] [place], [place] [address]  Comprehensive metabolic  panel     Status: Abnormal   Collection Time: 07/18/20  3:44 AM  Result Value Ref Range   Sodium 141 135 - 145 mmol/L   Potassium 3.9 3.5 - 5.1 mmol/L   Chloride 106 98 - 111 mmol/L   CO2 25 22 - 32 mmol/L   Glucose, Bld 339 (H) 70 - 99 mg/dL    Comment: Glucose reference range applies only to samples taken after fasting for at least 8 hours.   BUN 14 6 - 20 mg/dL   Creatinine, Ser 0.980.89 0.44 - 1.00 mg/dL   Calcium 8.3 (L) 8.9 - 10.3 mg/dL   Total Protein 5.3 (L) 6.5 - 8.1 g/dL   Albumin 3.0 (L) 3.5 - 5.0 g/dL   AST 9 (L) 15 - 41 U/L   ALT 12 0 - 44 U/L   Alkaline Phosphatase 79 38 - 126 U/L   Total Bilirubin 0.4 0.3 - 1.2 mg/dL   GFR, Estimated >11>60 >91>60 mL/min    Comment: (NOTE) Calculated using the CKD-EPI Creatinine Equation (2021)    Anion gap 10 5 - 15    Comment: Performed at [facility], [address] [place], [place] [address]  Magnesium     Status: Abnormal   Collection Time: 07/18/20  3:44 AM  Result Value Ref Range   Magnesium 1.5 (L) 1.7 - 2.4 mg/dL    Comment: Performed at [facility], [address] [place], [place] [address]  Phosphorus     Status: None   Collection Time:  07/18/20  3:44 AM  Result Value Ref Range   Phosphorus 4.6 2.5 - 4.6 mg/dL    Comment: Performed at [facility], [address] [place], [place] [address]  CBC     Status: Abnormal   Collection Time: 07/18/20  3:44 AM  Result Value Ref Range   WBC 5.4 4.0 - 10.5 K/uL   RBC 3.76 (L) 3.87 - 5.11 MIL/uL   Hemoglobin 10.7 (L) 12.0 - 15.0 g/dL   HCT 94.7 (L) 09.6 - 28.3 %   MCV 91.8 80.0 - 100.0 fL   MCH 28.5 26.0 - 34.0 pg   MCHC 31.0 30.0 - 36.0 g/dL   RDW 66.2 94.7 - 65.4 %   Platelets 158 150 - 400 K/uL   nRBC 0.0 0.0 - 0.2 %    Comment: Performed at [facility], [address], [place], [place] [address]  Lactic acid, plasma     Status: None   Collection Time: 07/18/20  3:44 AM  Result Value Ref Range    Lactic Acid, Venous 1.8 0.5 - 1.9 mmol/L    Comment: Performed at [facility], [address], [place], [place] [address]  Glucose, capillary     Status: Abnormal   Collection Time: 07/18/20  8:23 AM  Result Value Ref Range   Glucose-Capillary 251 (H) 70 - 99 mg/dL    Comment: Glucose reference range applies only to samples taken after fasting for at least 8 hours.  Glucose, capillary     Status: Abnormal   Collection Time: 07/18/20 11:27 AM  Result Value Ref Range   Glucose-Capillary 229 (H) 70 - 99 mg/dL    Comment: Glucose reference range applies only to samples taken after fasting for at least 8 hours.    Medications:  Current Facility-Administered Medications  Medication Dose Route Frequency Provider Last Rate Last Admin  . acetaminophen (TYLENOL) tablet 650 mg  650 mg Oral Q6H PRN Mansy, Jan A, MD   650 mg at 07/18/20 1034   Or  . acetaminophen (TYLENOL) suppository 650 mg  650 mg Rectal Q6H PRN Mansy, Jan A, MD      . acidophilus (RISAQUAD) capsule 1 capsule  1 capsule Oral Daily Mansy, Jan A, MD   1 capsule at 07/17/20 1140  . cefTRIAXone (ROCEPHIN) 1 g in sodium chloride 0.9 % 100 mL IVPB  1 g Intravenous Q24H Mansy, Jan A, MD 200 mL/hr at 07/18/20 0346 1 g at 07/18/20 0346  . cholecalciferol (VITAMIN D3) tablet 10,000 Units  10,000 Units Oral Daily Mansy, Jan A, MD   10,000 Units at 07/18/20 1034  . enoxaparin (LOVENOX) injection 40 mg  40 mg Subcutaneous Q24H Mansy, Jan A, MD   40 mg at 07/18/20 0851  . fluticasone (FLONASE) 50 MCG/ACT nasal spray 2 spray  2 spray Each Nare Daily Mansy, Jan A, MD   2 spray at 07/17/20 1142  . insulin aspart (novoLOG) injection 0-15 Units  0-15 Units Subcutaneous TID PC & HS Mansy, Vernetta Honey, MD   8 Units at 07/18/20 0855  . lactated ringers infusion   Intravenous Continuous Mansy, Vernetta Honey, MD 125 mL/hr at 07/18/20 0341 New Bag at 07/18/20 0341  . magic mouthwash  15 mL Oral QID Mansy, Jan A, MD   15 mL at 07/18/20 0849  .  magnesium hydroxide (MILK OF MAGNESIA) suspension 30 mL  30 mL Oral Daily PRN Mansy, Jan A, MD   30 mL at 07/18/20 1034  .  metoprolol succinate (TOPROL-XL) 24 hr tablet 12.5 mg  12.5 mg Oral Daily Mansy, Jan A, MD   12.5 mg at 07/18/20 0851  . mometasone-formoterol (DULERA) 200-5 MCG/ACT inhaler 2 puff  2 puff Inhalation BID Mansy, Vernetta Honey, MD   2 puff at 07/18/20 0851  . multivitamin with minerals tablet 1 tablet  1 tablet Oral Daily Mansy, Jan A, MD   1 tablet at 07/18/20 0851  . ondansetron (ZOFRAN) tablet 4 mg  4 mg Oral Q6H PRN Mansy, Jan A, MD       Or  . ondansetron [facility]) injection 4 mg  4 mg Intravenous Q6H PRN Mansy, Jan A, MD      . promethazine (PHENERGAN) tablet 12.5-25 mg  12.5-25 mg Oral Q8H PRN Mansy, Jan A, MD   25 mg at 07/17/20 2045  . sertraline (ZOLOFT) tablet 150 mg  150 mg Oral Daily Mansy, Jan A, MD   150 mg at 07/18/20 0850  . traZODone (DESYREL) tablet 25 mg  25 mg Oral QHS PRN Mansy, Vernetta Honey, MD   25 mg at 07/17/20 2038    Musculoskeletal: Strength & Muscle Tone: No atrophy noted. Gait & Station: UTA since patient is lying in bed. Patient leans: N/A  Psychiatric Specialty Exam: Physical Exam Vitals and nursing note reviewed.  Constitutional:      Appearance: She is well-developed.  HENT:     Head: Normocephalic and atraumatic.  Pulmonary:     Effort: Pulmonary effort is normal.  Musculoskeletal:        General: Normal range of motion.     Cervical back: Normal range of motion.  Neurological:     Mental Status: She is alert and oriented to person, place, and time.  Psychiatric:        Mood and Affect: Mood is depressed.        Speech: Speech normal.        Behavior: Behavior is slowed.        Thought Content: Thought content is paranoid. Thought content includes suicidal ideation. Thought content includes suicidal plan.        Cognition and Memory: Memory is impaired.        Judgment: Judgment is impulsive.     Review of Systems  Cardiovascular:  Negative for chest pain.  Gastrointestinal: Negative for abdominal pain, constipation, diarrhea, nausea and vomiting.  Psychiatric/Behavioral: Positive for agitation, depression, hallucinations and substance abuse. Negative for suicidal ideas. The patient is nervous/anxious and has insomnia.   All other systems reviewed and are negative.   Blood pressure 134/79, pulse 87, temperature 97.7 F (36.5 C), resp. rate 18, height 5\' 5"  (1.651 m), weight 81.2 kg, SpO2 99 %.Body mass index is 29.79 kg/m.  General Appearance: Fairly Groomed, middle aged, Caucasian female, wearing a hospital gown who is lying in bed. NAD.   Eye Contact:  Minimal  Speech:  Clear and Coherent and Slow  Volume:  Decreased  Mood:  Anxious  Affect:  Appropriate and Congruent  Thought Process:  Goal Directed, Linear and Descriptions of Associations: Intact  Orientation:  Full (Time, Place, and Person)  Thought Content:  Logical, Hallucinations: Visual and Paranoid Ideation, mild  Suicidal Thoughts:  No  Homicidal Thoughts:  No  Memory:  Immediate;   Fair Recent;   Fair Remote;   Fair  Judgement:  Fair  Insight:  Fair  Psychomotor Activity:  Decreased  Concentration:  Concentration: Good and Attention Span: Good  Recall:  Good  Fund of Knowledge:  Good  Language:  Good  Akathisia:  No  Handed:  Right  AIMS (if indicated):   N/A  Assets:  Communication Skills Housing Physical Health Resilience  ADL's:  Intact  Cognition:  Patient exhibits short term memory deficits.   Sleep:   Poor   Assessment:  EZGI BRUSSO is a 50 y.o. female with pyelonephritis, who has a history of [name] [name] and [name] [name]. On evaluation she is alert, yet drowsy will awake with calling of her name. She denies any depressive or mania symptoms at present, she states her behaviors yesterday due to her not receiving her pain medications. SHe appears very disappointed in the hospital system, not addressing her pain concerns at this  time. She endorses some paranoia and auditory hallucinations, in the setting of psychosocial stressors and pain. She denies suicidal ideation, homicidal ideations and or hallucinations. At present she is calm and cooperative, does not appear to be exhibiting any mania, psychosis, or paranoia. She answers all questions appropriately, in and out of sleep. SHe appears to be stable enough to resume outpaitent behavioral health resources.    Treatment Plan Summary: Will start Risperdal 0.5 mg BID for mood stabilization/psychosis on 07/17/2020.   -Start Trazodone 50 mg qhs PRN for insomnia.  -EKG reviewed and QTc 473. Please closely monitor when starting or increasing QTc prolonging agents.  -Will obtain UDS  To assess for any illicit substances that could contribute to her mania and psychosis.  -Psychiatry will sign off on patient at this time. Please consult psychiatry again as needed.   Disposition: No evidence of imminent risk to self or others at present.   Patient does not meet criteria for psychiatric inpatient admission. Supportive therapy provided about ongoing stressors. Refer to IOP. Discussed crisis plan, support from social network, calling 911, coming to the Emergency Department, and calling Suicide Hotline. Due to increase in number of ER visits, recommend referral to ACT Team for community management and crisis stabilizations.   This service was provided via telemedicine using a 2-way, interactive audio and video technology.  Names of all persons participating in this telemedicine service and their role in this encounter. Name:Treyvon Blahut Rosario Adie Role: PMHNP  Name: Otelia Limes Role: Patient    Maryagnes Amos, FNP 07/18/2020 11:58 AM

## 2020-07-18 NOTE — Progress Notes (Signed)
PROGRESS NOTE  [name] [name] AXK:[id number] DOB: 1971/02/21 DOA: 07/17/2020 PCP: Patient, No Pcp Per   LOS: 1 day   Brief narrative:  [name] [name]  is a 50 y.o. Caucasian  female with a known history of asthma, bipolar disorder, type 2 diabetes mellitus, schizophrenia, migraine and anxiety,  presented to the hospital with acute onset of bilateral flank and mid back pain with urinary urgency frequency and dysuria.  She also had fever and chills as well.  In the ED patient was noted to have temperature 100.4 and heart rate 104.  Labs revealed hyperglycemia of 243, BUN of 20 with creatinine 1.19.  Lactic acid was 3.1 and CBC was within normal.  UA was positive for UTI.  Urine and blood culture was sent.  Chest x-ray showed no acute cardiopulmonary disease. The patient was given 650 mg p.o. Tylenol and 50 mg of IV Toradol, 1/2 L bolus of IV lactated Ringer 150 mL/h in addition to IV Rocephin.    Patient was then admitted to hospital for further evaluation and treatment.    Assessment/Plan:  Principal Problem:   Acute pyelonephritis Active Problems:   OBESITY   Essential hypertension   Diabetes (HCC)   Bipolar disorder with moderate depression (HCC)   DM (diabetes mellitus), type 2, [name]   Homelessness   UTI with severe sepsis.  Concerning for acute pyelonephritis.  Patient was febrile and tachycardic on presentation with elevated lactate and acute kidney injury with possible source of infection as pyelonephritis.  Continue IV Rocephin, IV hydration, follow blood cultures.  Urine culture showing E. coli.  Follow susceptibility.  Patient had elevated lactic at 3.1 which is improved to 1.9 with hydration and antibiotics.   Incidental Covid positive. No dyspnea hypoxia.  Chest x-ray negative.  No indication for steroids at this time.  COVID-19 Labs  No results for input(s): DDIMER, FERRITIN, LDH, CRP in the last 72 hours.  Lab Results  Component Value Date   SARSCOV2NAA  POSITIVE (A) 07/17/2020   SARSCOV2NAA NEGATIVE 06/11/2020   SARSCOV2NAA NEGATIVE 06/03/2020   SARSCOV2NAA Not Detected 04/30/2019    Acute kidney injury.    Has improved.  Continue IV fluids.   Generalized weakness secondary to sepsis.  Will consider physical therapy evaluation when stable.  Diabetes mellitus type 2 with hyperglycemia.  Hold Metformin.  Continue sliding scale insulin, hemoglobin A1c of 9.7.  Essential hypertension.  Continue Toprol-XL.  Monitor blood pressure closely  Anxiety/bipolar, depression/schizophrenia.  Continue Zoloft trazodone.  Patient was noted to be agitated and manic this morning and needed sedation restraints.  Patient did have a manic episode.  Psychiatry has been consulted.  Will follow psychiatric recommendation.  Required 1 dose of IV Haldol 2 mg IV today.  Mild hypomagnesemia.  Will replace via IV and orally.  Continue to monitor closely.  Homelessness transition of care has been consulted..  DVT prophylaxis: enoxaparin (LOVENOX) injection 40 mg Start: 07/17/20 1000   Code Status: None  Family Communication: None  Status is: Inpatient  Remains inpatient appropriate because:IV treatments appropriate due to intensity of illness or inability to take PO and Inpatient level of care appropriate due to severity of illness   Dispo: The patient is from: Home              Anticipated d/c is to: Home              Anticipated d/c date is: 2 days  Patient currently is not medically stable to d/c.   Difficult to place patient No   Consultants: Psychiatry  Procedures:  None  Anti-infectives:  Marland Kitchen Rocephin IV 2/7>  Anti-infectives (From admission, onward)   Start     Dose/Rate Route Frequency Ordered Stop   07/18/20 0400  cefTRIAXone (ROCEPHIN) 1 g in sodium chloride 0.9 % 100 mL IVPB        1 g 200 mL/hr over 30 Minutes Intravenous Every 24 hours 07/17/20 0428     07/17/20 0245  cefTRIAXone (ROCEPHIN) 1 g in sodium chloride  0.9 % 100 mL IVPB  Status:  Discontinued        1 g 200 mL/hr over 30 Minutes Intravenous Every 24 hours 07/17/20 0238 07/17/20 0439      Subjective: Today, patient was seen and examined at bedside.  Staff reported that the patient was very manic agitated confused and needed sedation and restraints.  Complains of mild neck and back pain.  Objective: Vitals:   07/18/20 0133 07/18/20 0531  BP: 112/77 134/79  Pulse: 90 87  Resp: 20 18  Temp: 97.7 F (36.5 C) 97.7 F (36.5 C)  SpO2: 96% 99%    Intake/Output Summary (Last 24 hours) at 07/18/2020 1108 Last data filed at 07/18/2020 1012 Gross per 24 hour  Intake 3376.4 ml  Output --  Net 3376.4 ml   Filed Weights   07/16/20 2021  Weight: 81.2 kg   Body mass index is 29.79 kg/m.   Physical Exam: GENERAL: Patient is alert awake and communicative, mildly sedated at the time of my exam, complains of pain. Not in obvious distress. HENT: No scleral pallor or icterus. Pupils equally reactive to light. Oral mucosa is moist NECK: is supple, no gross swelling noted. CHEST: Clear to auscultation. No crackles or wheezes.  Diminished breath sounds bilaterally. CVS: S1 and S2 heard, no murmur. Regular rate and rhythm.  ABDOMEN: Soft, non-tender, bowel sounds are present. EXTREMITIES: No edema.  Upper extremity restraints in place. CNS: Cranial nerves are intact. No focal motor deficits.  Moving all the extremities. SKIN: warm and dry without rashes.  Data Review: I have personally reviewed the following laboratory data and studies,  CBC: Recent Labs  Lab 07/17/20 0105 07/17/20 0531 07/18/20 0344  WBC 9.9 7.1 5.4  NEUTROABS 5.5  --   --   HGB 12.7 10.8* 10.7*  HCT 39.8 34.1* 34.5*  MCV 89.0 89.7 91.8  PLT 224 168 158   Basic Metabolic Panel: Recent Labs  Lab 07/17/20 0105 07/17/20 0531 07/18/20 0344  NA 138 139 141  K 4.0 3.8 3.9  CL 104 104 106  CO2 19* 23 25  GLUCOSE 243* 130* 339*  BUN 20 20 14   CREATININE 1.19*  1.14* 0.89  CALCIUM 9.0 8.4* 8.3*  MG  --   --  1.5*  PHOS  --   --  4.6   Liver Function Tests: Recent Labs  Lab 07/17/20 0105 07/18/20 0344  AST 14* 9*  ALT 15 12  ALKPHOS 111 79  BILITOT 0.4 0.4  PROT 7.2 5.3*  ALBUMIN 4.0 3.0*   No results for input(s): LIPASE, AMYLASE in the last 168 hours. No results for input(s): AMMONIA in the last 168 hours. Cardiac Enzymes: No results for input(s): CKTOTAL, CKMB, CKMBINDEX, TROPONINI in the last 168 hours. BNP (last 3 results) No results for input(s): BNP in the last 8760 hours.  ProBNP (last 3 results) No results for input(s): PROBNP in the last 8760  hours.  CBG: Recent Labs  Lab 07/17/20 0739 07/17/20 1623 07/17/20 1736 07/17/20 2149 07/18/20 0823  GLUCAP 165* 178* 168* 154* 251*   Recent Results (from the past 240 hour(s))  Urine culture     Status: Abnormal (Preliminary result)   Collection Time: 07/17/20 12:51 AM   Specimen: In/Out Cath Urine  Result Value Ref Range Status   Specimen Description   Final    IN/OUT CATH URINE Performed at [facility] [address], [place], [place] [address]    Special Requests   Final    NONE Performed at [facility], [address], [place], [place] [address]    Culture (A)  Final    >=100,000 COLONIES/mL ESCHERICHIA COLI SUSCEPTIBILITIES TO FOLLOW Performed at [facility], [address] [place], [place] [address]    Report Status PENDING  Incomplete  Blood Culture (routine x 2)     Status: None (Preliminary result)   Collection Time: 07/17/20  2:38 AM   Specimen: BLOOD  Result Value Ref Range Status   Specimen Description   Final    BLOOD SITE NOT SPECIFIED Performed at [facility], [address] [place], [place] [address]    Special Requests   Final    BOTTLES DRAWN AEROBIC AND ANAEROBIC Blood Culture adequate volume Performed at [facility], [address], [place], [place] [address]     Culture   Final    NO GROWTH 1 DAY Performed at [facility], [address] [place], [place] [address]    Report Status PENDING  Incomplete  SARS Coronavirus 2 by RT PCR (hospital order, performed in [facility] hospital lab) Nasopharyngeal Nasopharyngeal Swab     Status: Abnormal   Collection Time: 07/17/20 11:47 AM   Specimen: Nasopharyngeal Swab  Result Value Ref Range Status   SARS Coronavirus 2 POSITIVE (A) NEGATIVE Final    Comment: RESULT CALLED TO, READ BACK BY AND VERIFIED WITH: DOWD,P. RN @1436  ON 02.07.2022 BY COHEN,K (NOTE) SARS-CoV-2 target nucleic acids are DETECTED  SARS-CoV-2 RNA is generally detectable in upper respiratory specimens  during the acute phase of infection.  Positive results are indicative  of the presence of the identified virus, but do not rule out bacterial infection or co-infection with other pathogens not detected by the test.  Clinical correlation with patient history and  other diagnostic information is necessary to determine patient infection status.  The expected result is negative.  Fact Sheet for Patients:   04.07.2022   Fact Sheet for Healthcare Providers:   BoilerBrush.com.cy    This test is not yet approved or cleared by the https://pope.com/ FDA and  has been authorized for detection and/or diagnosis of SARS-CoV-2 by FDA under an Emergency Use Authorization (EUA).  This EUA will remain in effect (meaning  this test can be used) for the duration of  the COVID-19 declaration under Section 564(b)(1) of the Act, 21 U.S.C. section 360-bbb-3(b)(1), unless the authorization is terminated or revoked sooner.  Performed at [facility], [address], [place], [place]      Studies: DG Chest Port 1 View  Result Date: 07/17/2020 CLINICAL DATA:  Mid to lower back pain EXAM: PORTABLE CHEST 1 VIEW COMPARISON:  April 25, 2020 FINDINGS: The heart size and  mediastinal contours are within normal limits. Both lungs are clear. The visualized skeletal structures are unremarkable. IMPRESSION: No active disease. Electronically Signed   By:  Jonna Clark M.D.   On: 07/17/2020 03:05      Joycelyn Das, MD  [facility] 07/18/2020  If 7PM-7AM, please contact night-coverage

## 2020-07-18 NOTE — Progress Notes (Signed)
Manic(pressured/rambling hyper-verbal speech throughout shift  bilat wrist restraints in place ativan 0.5mg /trazadone 25mg  adm with min relief slept approx 1 hr. Multiple runs of trigeminity/pvs's throughout shift. Mg 1.5  On call notified

## 2020-07-18 NOTE — Progress Notes (Addendum)
Pt. Refused to be hooked to telemetry. Throw the tele box in the floor adamantly refusing tele. MD notified, will re try again latter when pt.wiil be calmer.

## 2020-07-18 NOTE — Progress Notes (Addendum)
Went to check and assess patient, found pt. covered with her own feces, deliberately put feces on a cup and smudge it on siderails of the bed. Telemetry box was thrown on on the floor with wires full of feces. When asked why she did it pt. replied " you want to take photo and post  it on facebook". Pt. was redirected and educated. Pt. Remains agitated, restless and manic, Pt. was cleaned up thoroughly and has complete liinen changed. MD was made aware.  PRN Haldol IV given, pt. Was closely monitored.

## 2020-07-19 DIAGNOSIS — F3132 Bipolar disorder, current episode depressed, moderate: Secondary | ICD-10-CM | POA: Diagnosis not present

## 2020-07-19 DIAGNOSIS — E1165 Type 2 diabetes mellitus with hyperglycemia: Secondary | ICD-10-CM | POA: Diagnosis not present

## 2020-07-19 DIAGNOSIS — I10 Essential (primary) hypertension: Secondary | ICD-10-CM | POA: Diagnosis not present

## 2020-07-19 DIAGNOSIS — N10 Acute tubulo-interstitial nephritis: Secondary | ICD-10-CM | POA: Diagnosis not present

## 2020-07-19 LAB — GLUCOSE, CAPILLARY
Glucose-Capillary: 262 mg/dL — ABNORMAL HIGH (ref 70–99)
Glucose-Capillary: 217 mg/dL — ABNORMAL HIGH (ref 70–99)
Glucose-Capillary: 110 mg/dL — ABNORMAL HIGH (ref 70–99)
Glucose-Capillary: 390 mg/dL — ABNORMAL HIGH (ref 70–99)

## 2020-07-19 LAB — URINE CULTURE: Culture: >=100000 — AB

## 2020-07-19 LAB — URINE DRUGS OF ABUSE SCREEN W ALC, ROUTINE (REF LAB)
Amphetamines, Urine: NEGATIVE ng/mL
Barbiturate, Ur: NEGATIVE ng/mL
Benzodiazepine Quant, Ur: NEGATIVE ng/mL
Cannabinoid Quant, Ur: NEGATIVE ng/mL
Cocaine (Metab.): NEGATIVE ng/mL
Ethanol U, Quan: NEGATIVE %
Methadone Screen, Urine: NEGATIVE ng/mL
Opiate Quant, Ur: NEGATIVE ng/mL
Phencyclidine, Ur: NEGATIVE ng/mL
Propoxyphene, Urine: NEGATIVE ng/mL

## 2020-07-19 MED ORDER — DIPHENHYDRAMINE HCL 50 MG/ML IJ SOLN
50.0000 mg | Freq: Once | INTRAMUSCULAR | Status: AC
  Administered 2020-07-19: 50 mg via INTRAVENOUS
  Filled 2020-07-19: qty 1

## 2020-07-19 MED ORDER — FAMOTIDINE IN NACL 20-0.9 MG/50ML-% IV SOLN
20.0000 mg | Freq: Once | INTRAVENOUS | Status: AC
  Administered 2020-07-19: 20 mg via INTRAVENOUS
  Filled 2020-07-19: qty 50

## 2020-07-19 MED ORDER — EPINEPHRINE PF 1 MG/ML IJ SOLN
0.3000 mg | Freq: Once | INTRAMUSCULAR | Status: AC
  Administered 2020-07-19: 0.3 mg via INTRAMUSCULAR
  Filled 2020-07-19 (×2): qty 1

## 2020-07-19 MED ORDER — CEPHALEXIN 500 MG PO CAPS
500.0000 mg | ORAL_CAPSULE | Freq: Four times a day (QID) | ORAL | Status: DC
  Administered 2020-07-19 – 2020-07-20 (×7): 500 mg via ORAL
  Filled 2020-07-19 (×7): qty 1

## 2020-07-19 MED ORDER — EPINEPHRINE 0.3 MG/0.3ML IJ SOAJ: 0.3000 mg | Freq: Once | INTRAMUSCULAR | Status: DC

## 2020-07-19 MED ORDER — HALOPERIDOL LACTATE 5 MG/ML IJ SOLN: 2.0000 mg | Freq: Three times a day (TID) | INTRAMUSCULAR | Status: DC | PRN

## 2020-07-19 MED ORDER — HALOPERIDOL 2 MG PO TABS
2.0000 mg | ORAL_TABLET | Freq: Four times a day (QID) | ORAL | Status: DC | PRN
  Filled 2020-07-19: qty 1

## 2020-07-19 MED ORDER — HALOPERIDOL LACTATE 5 MG/ML IJ SOLN
2.0000 mg | Freq: Four times a day (QID) | INTRAMUSCULAR | Status: DC | PRN
  Filled 2020-07-19: qty 1

## 2020-07-19 MED ORDER — METHYLPREDNISOLONE SODIUM SUCC 125 MG IJ SOLR
125.0000 mg | Freq: Once | INTRAMUSCULAR | Status: AC
  Administered 2020-07-19: 125 mg via INTRAVENOUS
  Filled 2020-07-19: qty 2

## 2020-07-19 MED ORDER — MAGNESIUM OXIDE 400 (241.3 MG) MG PO TABS
400.0000 mg | ORAL_TABLET | Freq: Two times a day (BID) | ORAL | Status: DC
  Administered 2020-07-19 – 2020-07-20 (×4): 400 mg via ORAL
  Filled 2020-07-19 (×4): qty 1

## 2020-07-19 MED ORDER — HALOPERIDOL LACTATE 5 MG/ML IJ SOLN
2.0000 mg | Freq: Once | INTRAMUSCULAR | Status: AC
  Administered 2020-07-19: 2 mg via INTRAVENOUS

## 2020-07-19 NOTE — Progress Notes (Signed)
Patient c/o generalized muscle rigidity with swelling of tongue with diff swallowing. No respiratory distress noted,  O2 sat 99% room air, Pulse 90's/sinus   Benadryl 50 mg iv/solumedral 125mg  iv adm at 2025, as per On call  Patient verbalized improvement with stated concerns. Clinically improved Continue to monitior

## 2020-07-19 NOTE — Progress Notes (Signed)
PROGRESS NOTE  [name] [name] YPP:[id number] DOB: 04/04/1971 DOA: 07/17/2020 PCP: Patient, No Pcp Per   LOS: 2 days   Brief narrative:  [name] [name]  is a 50 y.o. Caucasian  female with a known history of asthma, bipolar disorder, type 2 diabetes mellitus, schizophrenia, migraine and anxiety,  presented to the hospital with acute onset of bilateral flank and mid back pain with urinary urgency frequency and dysuria.  She also had fever and chills as well.  In the ED, patient was noted to have temperature 100.42F and heart rate 104/min.  Labs revealed hyperglycemia of 243, BUN of 20 with creatinine 1.19.  Lactic acid was 3.1 and CBC was within normal.  UA was positive for UTI.  Urine and blood culture was sent.  Chest x-ray showed no acute cardiopulmonary disease.  Patient was then admitted to hospital for further evaluation and treatment.    Assessment/Plan:  Principal Problem:   Acute pyelonephritis Active Problems:   OBESITY   Essential hypertension   Diabetes (HCC)   Bipolar disorder with moderate depression (HCC)   DM (diabetes mellitus), type 2, [name]   Homelessness  E. coli UTI with severe sepsis.   Concerning for acute pyelonephritis.   Received IV Rocephin.  Patient has refused IV.  Will change to Keflex at this time.    Patient had elevated lactic at 3.1 which is improved to 1.9 with hydration and antibiotics.   Incidental Covid positive. No dyspnea or hypoxia.  Chest x-ray was negative.  No indication for steroids at this time.  COVID-19 Labs  No results for input(s): DDIMER, FERRITIN, LDH, CRP in the last 72 hours.  Lab Results  Component Value Date   SARSCOV2NAA POSITIVE (A) 07/17/2020   SARSCOV2NAA NEGATIVE 06/11/2020   SARSCOV2NAA NEGATIVE 06/03/2020   SARSCOV2NAA Not Detected 04/30/2019    Acute kidney injury.    Improved after IV fluids.  Creatinine of 0.8 today.  Generalized weakness secondary to sepsis.  PT has been consulted.  Patient has  been ambulating in the room.  Diabetes mellitus type 2 with hyperglycemia.    Continue to hold Metformin.  Continue sliding scale insulin, hemoglobin A1c of 9.7.  Closely monitor blood glucose levels.  Essential hypertension.  Continue Toprol-XL.  Monitor blood pressure closely  Anxiety/bipolar, depression/schizophrenia manic episode.  Seen by psychiatry.  Psychiatry has recommended Risperdal twice a day, trazodone.  Patient is already on Zoloft.  Continue with that.  Patient has been having episodes of agitation pulling lines so we will get one-to-one sitter, Haldol as needed for extreme agitation.  Continue to replenish electrolytes.  Monitor in telemetry but patient is refusing.  Mild hypomagnesemia.  Replenished IV and oral.  Will continue orally.  Refusing IV.  Homelessness transition of care has been consulted.  Spoke at the progression rounds.  DVT prophylaxis: enoxaparin (LOVENOX) injection 40 mg Start: 07/17/20 1000   Code Status: None  Family Communication: None  Status is: Inpatient  Remains inpatient appropriate because:IV treatments appropriate due to intensity of illness or inability to take PO and Inpatient level of care appropriate due to severity of illness   Dispo: The patient is from: Home              Anticipated d/c is to: Home              Anticipated d/c date is: 1 to 2 days or so.              Patient currently is  not medically stable to d/c.   Difficult to place patient No   Consultants: Psychiatry  Procedures:  None  Anti-infectives:  Marland Kitchen Rocephin IV 2/7>  Anti-infectives (From admission, onward)   Start     Dose/Rate Route Frequency Ordered Stop   07/19/20 1200  cephALEXin (KEFLEX) capsule 500 mg        500 mg Oral 4 times daily 07/19/20 1134     07/18/20 0400  cefTRIAXone (ROCEPHIN) 1 g in sodium chloride 0.9 % 100 mL IVPB  Status:  Discontinued        1 g 200 mL/hr over 30 Minutes Intravenous Every 24 hours 07/17/20 0428 07/19/20 1134    07/17/20 0245  cefTRIAXone (ROCEPHIN) 1 g in sodium chloride 0.9 % 100 mL IVPB  Status:  Discontinued        1 g 200 mL/hr over 30 Minutes Intravenous Every 24 hours 07/17/20 0238 07/17/20 0439      Subjective: Today, patient was seen and examined at bedside.  Nursing staff reported that the patient was very agitated confused.  Patient denies any fever, nausea, vomiting.  Mild agitated in the room trying to walk around.  Objective: Vitals:   07/18/20 2024 07/19/20 0625  BP: 122/70 (!) 152/91  Pulse: (!) 101 92  Resp: 18 18  Temp: 98.3 F (36.8 C) 99 F (37.2 C)  SpO2: 95% 96%    Intake/Output Summary (Last 24 hours) at 07/19/2020 1205 Last data filed at 07/19/2020 1007 Gross per 24 hour  Intake 2536.36 ml  Output --  Net 2536.36 ml   Filed Weights   07/16/20 2021  Weight: 81.2 kg   Body mass index is 29.79 kg/m.   Physical Exam: GENERAL: Patient is alert awake and communicative, mildly agitated, walking in the room, Not in obvious distress. HENT: No scleral pallor or icterus. Pupils equally reactive to light. Oral mucosa is moist NECK: is supple, no gross swelling noted. CHEST: Clear to auscultation. No crackles or wheezes.  Diminished breath sounds bilaterally. CVS: S1 and S2 heard, no murmur. Regular rate and rhythm.  ABDOMEN: Soft, non-tender, bowel sounds are present. EXTREMITIES: No edema.  CNS: Cranial nerves are intact. No focal motor deficits.  Moving all the extremities.  Manic mood. SKIN: warm and dry without rashes.  Data Review: I have personally reviewed the following laboratory data and studies,  CBC: Recent Labs  Lab 07/17/20 0105 07/17/20 0531 07/18/20 0344  WBC 9.9 7.1 5.4  NEUTROABS 5.5  --   --   HGB 12.7 10.8* 10.7*  HCT 39.8 34.1* 34.5*  MCV 89.0 89.7 91.8  PLT 224 168 158   Basic Metabolic Panel: Recent Labs  Lab 07/17/20 0105 07/17/20 0531 07/18/20 0344  NA 138 139 141  K 4.0 3.8 3.9  CL 104 104 106  CO2 19* 23 25  GLUCOSE 243*  130* 339*  BUN 20 20 14   CREATININE 1.19* 1.14* 0.89  CALCIUM 9.0 8.4* 8.3*  MG  --   --  1.5*  PHOS  --   --  4.6   Liver Function Tests: Recent Labs  Lab 07/17/20 0105 07/18/20 0344  AST 14* 9*  ALT 15 12  ALKPHOS 111 79  BILITOT 0.4 0.4  PROT 7.2 5.3*  ALBUMIN 4.0 3.0*   No results for input(s): LIPASE, AMYLASE in the last 168 hours. No results for input(s): AMMONIA in the last 168 hours. Cardiac Enzymes: No results for input(s): CKTOTAL, CKMB, CKMBINDEX, TROPONINI in the last 168 hours. BNP (  last 3 results) No results for input(s): BNP in the last 8760 hours.  ProBNP (last 3 results) No results for input(s): PROBNP in the last 8760 hours.  CBG: Recent Labs  Lab 07/18/20 1127 07/18/20 1722 07/18/20 2021 07/19/20 0727 07/19/20 1155  GLUCAP 229* 177* 121* 262* 217*   Recent Results (from the past 240 hour(s))  Urine culture     Status: Abnormal   Collection Time: 07/17/20 12:51 AM   Specimen: In/Out Cath Urine  Result Value Ref Range Status   Specimen Description   Final    IN/OUT CATH URINE Performed at [facility], [address] [place], [place] [address]    Special Requests   Final    NONE Performed at [facility], [address] [place], [place] [address]    Culture >=100,000 COLONIES/mL ESCHERICHIA COLI (A)  Final   Report Status 07/19/2020 FINAL  Final   Organism ID, Bacteria ESCHERICHIA COLI (A)  Final      Susceptibility   Escherichia coli - MIC*    AMPICILLIN >=32 RESISTANT Resistant     CEFAZOLIN <=4 SENSITIVE Sensitive     CEFEPIME <=0.12 SENSITIVE Sensitive     CEFTRIAXONE <=0.25 SENSITIVE Sensitive     CIPROFLOXACIN <=0.25 SENSITIVE Sensitive     GENTAMICIN >=16 RESISTANT Resistant     IMIPENEM <=0.25 SENSITIVE Sensitive     NITROFURANTOIN <=16 SENSITIVE Sensitive     TRIMETH/SULFA >=320 RESISTANT Resistant     AMPICILLIN/SULBACTAM >=32 RESISTANT Resistant     PIP/TAZO <=4 SENSITIVE Sensitive      * >=100,000 COLONIES/mL ESCHERICHIA COLI  Blood Culture (routine x 2)     Status: None (Preliminary result)   Collection Time: 07/17/20  2:38 AM   Specimen: BLOOD  Result Value Ref Range Status   Specimen Description   Final    BLOOD SITE NOT SPECIFIED Performed at [facility], [address] [place], [place] [address]    Special Requests   Final    BOTTLES DRAWN AEROBIC AND ANAEROBIC Blood Culture adequate volume Performed at [facility], [address] [place], [place] [address]    Culture   Final    NO GROWTH 2 DAYS Performed at [facility], [address] [place], [place] [address]    Report Status PENDING  Incomplete  SARS Coronavirus 2 by RT PCR (hospital order, performed in [facility] hospital lab) Nasopharyngeal Nasopharyngeal Swab     Status: Abnormal   Collection Time: 07/17/20 11:47 AM   Specimen: Nasopharyngeal Swab  Result Value Ref Range Status   SARS Coronavirus 2 POSITIVE (A) NEGATIVE Final    Comment: RESULT CALLED TO, READ BACK BY AND VERIFIED WITH: DOWD,P. RN @1436  ON 02.07.2022 BY COHEN,K (NOTE) SARS-CoV-2 target nucleic acids are DETECTED  SARS-CoV-2 RNA is generally detectable in upper respiratory specimens  during the acute phase of infection.  Positive results are indicative  of the presence of the identified virus, but do not rule out bacterial infection or co-infection with other pathogens not detected by the test.  Clinical correlation with patient history and  other diagnostic information is necessary to determine patient infection status.  The expected result is negative.  Fact Sheet for Patients:   04.07.2022   Fact Sheet for Healthcare Providers:   BoilerBrush.com.cy    This test is not yet approved or cleared by the https://pope.com/ FDA and  has been authorized for detection and/or diagnosis of SARS-CoV-2 by FDA  under an Emergency Use Authorization  (EUA).  This EUA will remain in effect (meaning  this test can be used) for the duration of  the COVID-19 declaration under Section 564(b)(1) of the Act, 21 U.S.C. section 360-bbb-3(b)(1), unless the authorization is terminated or revoked sooner.  Performed at [facility], [address], [place], [place] [address]      Studies: No results found.    Joycelyn Das, MD  [facility] 07/19/2020  If 7PM-7AM, please contact night-coverage

## 2020-07-19 NOTE — Progress Notes (Signed)
Around 1115 pt verbalizes c/o tongue swelling and talking funny. Pt has tongue lifted and pushed back to throat making herself talk funny.   Around 1600 pt started clenching her lower mouth/jaw making her muscles stick out the side of her neck; remains talking funny  Asked to stick out her tongue, swallow liquids, and swallow food both incidences. Pt was able to do all tasks without difficulty.   Pt insisting on speaking with pt herself. MD advised to monitor pt and call for stridor.   Hand restraints removed at 3p when sitter was able to placed at bedside.

## 2020-07-19 NOTE — Progress Notes (Signed)
Pt was found taking pills from her personal belongings bag. Sitter observed as she was unable to remove from pt prior to her swallowing.   Pt freely gave bag of medications to writer and discussed important of keeping strict medication regimen. She verbalizes understanding.  Pt still holding small amount of saliva in mouth but when she drinks fluids via straw it goes away.   MD notified of above via secure chat.  Psychiatric consult placed. Sitter advised to keep all belongings in one area and remove from bag what pt is wanting. Sitter verbalizes understanding.

## 2020-07-19 NOTE — Progress Notes (Signed)
Patient refused peripheral IV/continues to refuse telemetry

## 2020-07-19 NOTE — Progress Notes (Signed)
Pt alert and oriented to person, place. She's aware of her situation but forgetful with day/date. She's talking about her children and then changes the subject easily. Found on window seal in room writing on the window for help. Also wrote on paper food tray and held it to the window. Pt then walked to nurses station x2 asking for assistance outside to smoke.   Assisted pt off the window seal and back into bed. Notified Dr. Tyson Babinski of pt's current state of mind. Pt allowed for writer to place saline lock; tolerated insertion well.    Pt became combative and agitated; tore isolation gown on security guard. Given haldol IV and placed back in restraints with assistance from other staff and security.

## 2020-07-20 DIAGNOSIS — I10 Essential (primary) hypertension: Secondary | ICD-10-CM | POA: Diagnosis not present

## 2020-07-20 DIAGNOSIS — F3132 Bipolar disorder, current episode depressed, moderate: Secondary | ICD-10-CM | POA: Diagnosis not present

## 2020-07-20 DIAGNOSIS — E119 Type 2 diabetes mellitus without complications: Secondary | ICD-10-CM | POA: Diagnosis not present

## 2020-07-20 DIAGNOSIS — N10 Acute tubulo-interstitial nephritis: Secondary | ICD-10-CM | POA: Diagnosis not present

## 2020-07-20 LAB — BASIC METABOLIC PANEL
Anion gap: 10 (ref 5–15)
BUN: 12 mg/dL (ref 6–20)
CO2: 26 mmol/L (ref 22–32)
Calcium: 9.2 mg/dL (ref 8.9–10.3)
Chloride: 102 mmol/L (ref 98–111)
Creatinine, Ser: 0.91 mg/dL (ref 0.44–1.00)
GFR, Estimated: >60 mL/min (ref >60)
Glucose, Bld: 337 mg/dL — ABNORMAL HIGH (ref 70–99)
Potassium: 5 mmol/L (ref 3.5–5.1)
Sodium: 138 mmol/L (ref 135–145)

## 2020-07-20 LAB — CBC
HCT: 38.3 % (ref 36.0–46.0)
Hemoglobin: 12.5 g/dL (ref 12.0–15.0)
MCH: 28.7 pg (ref 26.0–34.0)
MCHC: 32.6 g/dL (ref 30.0–36.0)
MCV: 88 fL (ref 80.0–100.0)
Platelets: 195 10*3/uL (ref 150–400)
RBC: 4.35 MIL/uL (ref 3.87–5.11)
RDW: 13.2 % (ref 11.5–15.5)
WBC: 9.3 10*3/uL (ref 4.0–10.5)
nRBC: 0 % (ref 0.0–0.2)

## 2020-07-20 LAB — MAGNESIUM: Magnesium: 1.6 mg/dL — ABNORMAL LOW (ref 1.7–2.4)

## 2020-07-20 LAB — GLUCOSE, CAPILLARY
Glucose-Capillary: 348 mg/dL — ABNORMAL HIGH (ref 70–99)
Glucose-Capillary: 221 mg/dL — ABNORMAL HIGH (ref 70–99)
Glucose-Capillary: 185 mg/dL — ABNORMAL HIGH (ref 70–99)

## 2020-07-20 LAB — PHOSPHORUS: Phosphorus: 2 mg/dL — ABNORMAL LOW (ref 2.5–4.6)

## 2020-07-20 MED ORDER — LIP MEDEX EX OINT
TOPICAL_OINTMENT | CUTANEOUS | Status: DC | PRN
  Filled 2020-07-20: qty 7

## 2020-07-20 MED ORDER — MUSCLE RUB 10-15 % EX CREA
TOPICAL_CREAM | CUTANEOUS | Status: DC | PRN
  Administered 2020-07-20: 1 via TOPICAL
  Filled 2020-07-20: qty 85

## 2020-07-20 MED ORDER — MAGNESIUM OXIDE 400 (241.3 MG) MG PO TABS: 400.0000 mg | ORAL_TABLET | Freq: Two times a day (BID) | ORAL | 0 refills | Status: DC

## 2020-07-20 MED ORDER — SERTRALINE HCL 100 MG PO TABS: 150.0000 mg | ORAL_TABLET | Freq: Every day | ORAL | Status: DC

## 2020-07-20 MED ORDER — POTASSIUM PHOSPHATE MONOBASIC 500 MG PO TABS: 500.0000 mg | ORAL_TABLET | Freq: Two times a day (BID) | ORAL | 0 refills | Status: AC

## 2020-07-20 MED ORDER — RISPERIDONE 0.5 MG PO TABS: 0.5000 mg | ORAL_TABLET | Freq: Two times a day (BID) | ORAL | 2 refills | Status: DC

## 2020-07-20 MED ORDER — CEPHALEXIN 500 MG PO CAPS: 500.0000 mg | ORAL_CAPSULE | Freq: Four times a day (QID) | ORAL | 0 refills | Status: AC

## 2020-07-20 MED ORDER — POTASSIUM & SODIUM PHOSPHATES 280-160-250 MG PO PACK
1.0000 | PACK | Freq: Three times a day (TID) | ORAL | Status: DC
  Administered 2020-07-20 (×2): 1 via ORAL
  Filled 2020-07-20 (×6): qty 1

## 2020-07-20 NOTE — Discharge Summary (Signed)
Physician Discharge Summary  [name] [name] SMO:[id number] DOB: Feb 28, 1971 DOA: 07/17/2020  PCP: Patient, No Pcp Per  Admit date: 07/17/2020 Discharge date: 07/20/2020  Admitted From: Home  Discharge disposition: Home   Recommendations for Outpatient Follow-Up:   . Follow up with your primary care provider in one week.  . Check CBC, BMP, magnesium in the next visit . Follow-up with [facility] psychiatry in 1 to 2 weeks.   Discharge Diagnosis:   Principal Problem:   Acute pyelonephritis Active Problems:   OBESITY   Essential hypertension   Diabetes (HCC)   Bipolar disorder with moderate depression (HCC)   DM (diabetes mellitus), type 2, uncontrolled ([place])   Homelessness   Discharge Condition: Improved.  Diet recommendation: Low sodium, heart healthy.  Carbohydrate-modified.    Wound care: None.  Code status: Full.   History of Present Illness:   [name] [name] a7 y.o.Caucasian femalewith a known history of asthma, bipolar disorder, type 2 diabetes mellitus, schizophrenia, migraine and anxiety,  presented to the hospital with acute onset of bilateral flank and mid back pain with urinary urgency, frequency and dysuria.  She also had fever and chills as well.  In the ED, patient was noted to have temperature 100.89F and heart rate 104/min. Labs revealed hyperglycemia of 243, BUN of 20 with creatinine 1.19. Lactic acid was 3.1 and CBC was within normal. UA was positive for UTI. Urine and blood culture was sent. Chest x-ray showed no acute cardiopulmonary disease.  Patient was then admitted to hospital for further evaluation and treatment.     Hospital Course:   Following conditions were addressed during hospitalization as listed below,  E. coli UTI with severe sepsis.  Concerning for acute pyelonephritis.  Received IV Rocephin.    Antibiotics has been changed to Keflex on discharge.   Patient had elevated lactic at 3.1 which is improved to 1.9  with hydration and antibiotics. Patient has clinically improved  Incidental Covid positive. Asymptomatic.  No dyspnea or hypoxia.  Chest x-ray was negative.  No indication for steroids.  Remdesivir was not given.  Acute kidney injury.  Improved after IV fluids.  Creatinine of 0.8 prior to discharge  Generalized weakness secondary to sepsis.    Improved  Diabetes mellitus type 2 with hyperglycemia.    Resume Metformin on discharge.  Advised diabetic diet.  hemoglobin A1c of 9.7.   Compliance to medications emphasized  Essential hypertension.Continue Toprol-XL.   Anxiety/bipolar, depression/schizophrenia manic episode.  Seen by psychiatry realization.  Psychiatry has recommended Risperdal twice a day, trazodone at bedtime.  Patient is already on Zoloft at home which will be continued.  Psychiatry has recommended a follow-up with behavioral health at [facility] on discharge  Mild hypomagnesemia.    Refusing IV supplementation IV.  Replenish orally.  Will be given oral magnesium on discharge.  Mild hypophosphatemia.  Will be given phosphate supplement on discharge.  Homelessnesstransition of care has been consulted.    Planning for [facility] on discharge.  Disposition.  At this time, patient is stable for disposition home.  Patient will need to follow-up with her primary care physician/ behavioral health as outpatient  Medical Consultants:    Psychiatry  Procedures:    None Subjective:   Today, patient was seen and examined at bedside.  Denies any fever, nausea, vomiting, chills or rigor.  Discharge Exam:   Vitals:   07/20/20 0908 07/20/20 1300  BP: (!) 143/93 (!) 142/76  Pulse: 98 92  Resp:  (!) 25  Temp:  98.1 F (36.7 C)  SpO2:  94%   Vitals:   07/19/20 2224 07/20/20 0300 07/20/20 0908 07/20/20 1300  BP: (!) 157/98 131/77 (!) 143/93 (!) 142/76  Pulse: (!) 108 94 98 92  Resp: 20   (!) 25  Temp: 98.3 F (36.8 C) 98.2 F (36.8 C)  98.1 F  (36.7 C)  TempSrc: Oral Oral  Oral  SpO2: 95% 94%  94%  Weight:      Height:       General: Alert awake, not in obvious distress, communicative, pressurized speech HENT: pupils equally reacting to light,  No scleral pallor or icterus noted. Oral mucosa is moist.  Chest:  Clear breath sounds.  Diminished breath sounds bilaterally. No crackles or wheezes.  CVS: S1 &S2 heard. No murmur.  Regular rate and rhythm. Abdomen: Soft, nontender, nondistended.  Bowel sounds are heard.   Extremities: No cyanosis, clubbing or edema.  Peripheral pulses are palpable. Psych: Alert, awake and communicative, elevated mood, CNS:  No cranial nerve deficits.  Power equal in all extremities.   Skin: Warm and dry.  No rashes noted.  The results of significant diagnostics from this hospitalization (including imaging, microbiology, ancillary and laboratory) are listed below for reference.     Diagnostic Studies:   DG Chest Port 1 View  Result Date: 07/17/2020 CLINICAL DATA:  Mid to lower back pain EXAM: PORTABLE CHEST 1 VIEW COMPARISON:  April 25, 2020 FINDINGS: The heart size and mediastinal contours are within normal limits. Both lungs are clear. The visualized skeletal structures are unremarkable. IMPRESSION: No active disease. Electronically Signed   By: Prudencio Pair M.D.   On: 07/17/2020 03:05     Labs:   Basic Metabolic Panel: Recent Labs  Lab 07/17/20 0105 07/17/20 0531 07/18/20 0344 07/20/20 0356  NA 138 139 141 138  K 4.0 3.8 3.9 5.0  CL 104 104 106 102  CO2 19* $Remov'23 25 26  'GoGTnR$ GLUCOSE 243* 130* 339* 337*  BUN $Re'20 20 14 12  'dHc$ CREATININE 1.19* 1.14* 0.89 0.91  CALCIUM 9.0 8.4* 8.3* 9.2  MG  --   --  1.5* 1.6*  PHOS  --   --  4.6 2.0*   GFR Estimated Creatinine Clearance: 77 mL/min (by C-G formula based on SCr of 0.91 mg/dL). Liver Function Tests: Recent Labs  Lab 07/17/20 0105 07/18/20 0344  AST 14* 9*  ALT 15 12  ALKPHOS 111 79  BILITOT 0.4 0.4  PROT 7.2 5.3*  ALBUMIN 4.0 3.0*    No results for input(s): LIPASE, AMYLASE in the last 168 hours. No results for input(s): AMMONIA in the last 168 hours. Coagulation profile Recent Labs  Lab 07/17/20 0531  INR 1.3*    CBC: Recent Labs  Lab 07/17/20 0105 07/17/20 0531 07/18/20 0344 07/20/20 0356  WBC 9.9 7.1 5.4 9.3  NEUTROABS 5.5  --   --   --   HGB 12.7 10.8* 10.7* 12.5  HCT 39.8 34.1* 34.5* 38.3  MCV 89.0 89.7 91.8 88.0  PLT 224 168 158 195   Cardiac Enzymes: No results for input(s): CKTOTAL, CKMB, CKMBINDEX, TROPONINI in the last 168 hours. BNP: Invalid input(s): POCBNP CBG: Recent Labs  Lab 07/19/20 1155 07/19/20 1648 07/19/20 2230 07/20/20 0752 07/20/20 1148  GLUCAP 217* 110* 390* 348* 221*   D-Dimer No results for input(s): DDIMER in the last 72 hours. Hgb A1c No results for input(s): HGBA1C in the last 72 hours. Lipid Profile No results for input(s): CHOL, HDL, LDLCALC, TRIG,  CHOLHDL, LDLDIRECT in the last 72 hours. Thyroid function studies No results for input(s): TSH, T4TOTAL, T3FREE, THYROIDAB in the last 72 hours.  Invalid input(s): FREET3 Anemia work up No results for input(s): VITAMINB12, FOLATE, FERRITIN, TIBC, IRON, RETICCTPCT in the last 72 hours. Microbiology Recent Results (from the past 240 hour(s))  Urine culture     Status: Abnormal   Collection Time: 07/17/20 12:51 AM   Specimen: In/Out Cath Urine  Result Value Ref Range Status   Specimen Description   Final    IN/OUT CATH URINE Performed at [facility], [place] [address] [place], [place] [address]    Special Requests   Final    NONE Performed at [facility], [place] [address] [place], [place] [address]    Culture >=100,000 COLONIES/mL ESCHERICHIA COLI (A)  Final   Report Status 07/19/2020 FINAL  Final   Organism ID, Bacteria ESCHERICHIA COLI (A)  Final      Susceptibility   Escherichia coli - MIC*    AMPICILLIN >=32 RESISTANT Resistant     CEFAZOLIN <=4 SENSITIVE  Sensitive     CEFEPIME <=0.12 SENSITIVE Sensitive     CEFTRIAXONE <=0.25 SENSITIVE Sensitive     CIPROFLOXACIN <=0.25 SENSITIVE Sensitive     GENTAMICIN >=16 RESISTANT Resistant     IMIPENEM <=0.25 SENSITIVE Sensitive     NITROFURANTOIN <=16 SENSITIVE Sensitive     TRIMETH/SULFA >=320 RESISTANT Resistant     AMPICILLIN/SULBACTAM >=32 RESISTANT Resistant     PIP/TAZO <=4 SENSITIVE Sensitive     * >=100,000 COLONIES/mL ESCHERICHIA COLI  Blood Culture (routine x 2)     Status: None (Preliminary result)   Collection Time: 07/17/20  2:38 AM   Specimen: BLOOD  Result Value Ref Range Status   Specimen Description   Final    BLOOD SITE NOT SPECIFIED Performed at [facility], [address] [place], [place] [address]    Special Requests   Final    BOTTLES DRAWN AEROBIC AND ANAEROBIC Blood Culture adequate volume Performed at [place] [address], [place], [place] [address]    Culture   Final    NO GROWTH 3 DAYS Performed at [facility], [place] [address] [place], [place] [address]    Report Status PENDING  Incomplete  SARS Coronavirus 2 by RT PCR (hospital order, performed in [facility] hospital lab) Nasopharyngeal Nasopharyngeal Swab     Status: Abnormal   Collection Time: 07/17/20 11:47 AM   Specimen: Nasopharyngeal Swab  Result Value Ref Range Status   SARS Coronavirus 2 POSITIVE (A) NEGATIVE Final    Comment: RESULT CALLED TO, READ BACK BY AND VERIFIED WITH: DOWD,P. RN $RemoveBe'@1436'LAElzzCnT$  ON 02.07.2022 BY COHEN,K (NOTE) SARS-CoV-2 target nucleic acids are DETECTED  SARS-CoV-2 RNA is generally detectable in upper respiratory specimens  during the acute phase of infection.  Positive results are indicative  of the presence of the identified virus, but do not rule out bacterial infection or co-infection with other pathogens not detected by the test.  Clinical correlation with patient history and  other diagnostic information is necessary to determine  patient infection status.  The expected result is negative.  Fact Sheet for Patients:   StrictlyIdeas.no   Fact Sheet for Healthcare Providers:   BankingDealers.co.za    This test is not yet approved or cleared by the [place] FDA and  has been authorized for detection and/or diagnosis of SARS-CoV-2 by FDA under an Emergency Use Authorization (EUA).  This EUA will remain in effect (meaning  this test can be used) for the duration of  the COVID-19 declaration under Section 564(b)(1) of the Act, 21 U.S.C. section 360-bbb-3(b)(1), unless the authorization is terminated or revoked sooner.  Performed at [facility], [place] [address] [place], [place] [address]      Discharge Instructions:   Discharge Instructions    Diet Carb Modified   Complete by: As directed    Discharge instructions   Complete by: As directed    Follow-up with your primary care physician in 1 week.  Complete the course of antibiotic.  You have been prescribed  Risperidone which you need to take regularly.   Increase activity slowly   Complete by: As directed    No wound care   Complete by: As directed      Allergies as of 07/20/2020      Reactions   Tramadol Nausea Only   "feeling like I'm on fire"   Nicotine Rash   Patient states she is allergic to the Nicotine patch and they cause her to break out in a rash.  She states she can smoke cigarettes.      Medication List    STOP taking these medications   Pen Needles 3/16" 31G X 5 MM Misc     TAKE these medications   APPLE CIDER VINEGAR PO Take 1 capsule by mouth 3 (three) times daily with meals.   blood glucose meter kit and supplies Dispense based on patient and insurance preference. Use up to four times daily as directed. (FOR ICD-10 E10.9, E11.9).   cephALEXin 500 MG capsule Commonly known as: KEFLEX Take 1 capsule (500 mg total) by mouth 4 (four) times daily for 5 days.    magnesium oxide 400 (241.3 Mg) MG tablet Commonly known as: MAG-OX Take 1 tablet (400 mg total) by mouth 2 (two) times daily.   metFORMIN 1000 MG tablet Commonly known as: GLUCOPHAGE Take 0.5 tablets (500 mg total) by mouth 2 (two) times daily. What changed: how much to take   metoprolol succinate 25 MG 24 hr tablet Commonly known as: TOPROL-XL Take 12.5 mg by mouth daily.   multivitamin with minerals Tabs tablet Take 1 tablet by mouth daily.   potassium phosphate (monobasic) 500 MG tablet Commonly known as: K-PHOS ORIGINAL Take 1 tablet (500 mg total) by mouth 2 (two) times daily with a meal for 5 days.   Probiotic Acidophilus Caps Take 1 capsule by mouth daily.   risperiDONE 0.5 MG tablet Commonly known as: RISPERDAL Take 1 tablet (0.5 mg total) by mouth 2 (two) times daily.   sertraline 100 MG tablet Commonly known as: ZOLOFT Take 1.5 tablets (150 mg total) by mouth daily.   traZODone 50 MG tablet Commonly known as: DESYREL Take 1 tablet (50 mg total) by mouth at bedtime as needed for sleep. What changed: how much to take   Vitamin D3 250 MCG (10000 UT) capsule Take 10,000 Units by mouth daily.       [place] Follow up.   Why: regular followup, follow up with your psychiatry doctor Contact information: [place] [id number] [phone]                Time coordinating discharge: 39 minutes  Signed:  Jamale Spangler  [facility] 07/20/2020, 3:23 PM

## 2020-07-20 NOTE — Progress Notes (Signed)
VAST consulted to obtain IV access. Pt currently has no IV meds/fluids or studies ordered. Spoke with Dr. Tyson Babinski, MD and Britta Mccreedy, pt's nurse via SecureChat; educated it is best practice not to place an IV that is not currently needed to decrease infection risk and allow for vein preservation. Further educated if pt's condition changes and IV access is needed emergently, IV team consult should be placed STAT with a comment as to why an emergent IV is needed. Dr. Tyson Babinski confirmed it is okay to leave patient without IV access at this time.

## 2020-07-20 NOTE — Consult Note (Addendum)
Telepsych Consultation   Reason for Consult:  Manic Referring Physician: Dr. Tyson Babinski Location of Patient: [id number] Location of Provider: [facility]  Patient Identification: [name] [name] MRN:  [id number] Principal Diagnosis: Acute pyelonephritis Diagnosis:  Principal Problem:   Acute pyelonephritis Active Problems:   OBESITY   Essential hypertension   Diabetes (HCC)   [name] [name] with moderate depression (HCC)   [name], [name], [name]   Homelessness   Total Time spent with patient: 1 hour  Subjective:   [name] [name] is a 50 y.o. female patient admitted with pyelnephritis.   HPI:  [name] [name], [name] [name], [name] [name], [name] [name], [name] [name],  presented to the hospital with acute onset of bilateral flank and mid back pain with urinary urgency frequency and dysuria.  She also had fever and chills as well.  In the ED patient was noted to have temperature 100.4 and heart rate 104. Labs revealed hyperglycemia of 243, BUN of 20 with creatinine 1.19. Lactic acid was 3.1 and CBC was within normal. UA was positive for UTI. Urine and blood culture was sent. Chest x-ray showed no acute cardiopulmonary disease. The patient was given 650 mg p.o. Tylenol and 50 mg of IV Toradol, 1/2 L bolus of IV lactated Ringer 150 mL/h in addition to IV Rocephin.   Patient was then admitted to hospital for further evaluation and treatment.   On interview, [name] [name] reports,"I need to go from here. I been here three days they want do anything for me. I need something for pain. " I can go to my doctor and get something for pain.  I came here on my own, I can leave on my own. " She reports having urges of nicotine and to smoke.   She is calm and cooperative, very talkative, and presents today with pressured speech. She is ruminating about her son death (possible  suicide, girlfriend, unknown location of body). She appears to be  Exhibiting some signs of mania, yet is coherent and able to have linear conversation.  She does not appear to be responding to internal or external stimuli at this time. At present there is no current psychosis, she denies delusions, hallucinations, and or paranoia. She denies any signs of depression at this time to include suicidal thoughts. She is not exhibiting any psychomotor agitation, and appears to be back at baseline. SHe does report that her biggest concern at this time is getting pain medications, and she continues to ruminate about her medications. She denies suicidal ideations and homicidal ideations at this time.   Past Psychiatric History: [name] [name]  Risk to Self:   No   Risk to Others:  None. Denies HI.  Prior Inpatient Therapy:  She was hospitalized at [facility] in 05/2018, 12/2018 for mania with psychosis.  Prior Outpatient Therapy:  She is followed at [facility]   Past Medical History:  Past Medical History:  Diagnosis Date  . [name]   . [name]   . [name] affective [name] (HCC)   . Depression   . [name]   . [name]   . [name] [name] [facility])     Past Surgical History:  Procedure Laterality Date  . KNEE ARTHROSCOPY     x 2  . KNEE SURGERY Left    Incision made and knee cleaned out  . TUBAL LIGATION     Family History:  Family History  Problem Relation Age of  Onset  . [name] [name] Mother   . Hypertension Father   . Diabetes Father    Family Psychiatric  History: [name] [name]-mother, [name] Aunt "Thurston Hole" , Uncle "Boby", Cousin "[name]" completed suicide Social History:  Social History   Substance and Sexual Activity  Alcohol Use Not Currently   Comment: occasionally     Social History   Substance and Sexual Activity  Drug Use No    Social History   Socioeconomic History  . Marital  status: Divorced    Spouse name: Not on file  . Number of children: Not on file  . Years of education: Not on file  . Highest education level: Not on file  Occupational History  . Not on file  Tobacco Use  . Smoking status: Current Every Day Smoker    Packs/day: 1.00    Years: 29.00    Pack years: 29.00    Types: Cigarettes  . Smokeless tobacco: Never Used  Vaping Use  . Vaping Use: Former  Substance and Sexual Activity  . Alcohol use: Not Currently    Comment: occasionally  . Drug use: No  . Sexual activity: Yes    Birth control/protection: Surgical  Other Topics Concern  . Not on file  Social History Narrative   [name]   She has total 4 kids.   She is a single mom.         Social Determinants of Health   Financial Resource Strain: Not on file  Food Insecurity: Not on file  Transportation Needs: Not on file  Physical Activity: Not on file  Stress: Not on file  Social Connections: Not on file   Additional Social History: She lives alone. She is single. She has 4 adult children. She does not have much contact with her children. She denies alcohol or illicit substance use.     Allergies:   Allergies  Allergen Reactions  . Tramadol Nausea Only    "feeling like I'm on fire"  . Nicotine Rash    Patient states she is allergic to the Nicotine patch and they cause her to break out in a rash.  She states she can smoke cigarettes.    Labs:  Results for orders placed or performed during the hospital encounter of 07/17/20 (from the past 48 hour(s))  Urine drugs of abuse scrn w alc, routine (Ref Lab)     Status: None   Collection Time: 07/18/20  4:15 PM  Result Value Ref Range   Amphetamines, Urine Negative Cutoff=1000 ng/mL    Comment: Amphetamine test includes Amphetamine and Methamphetamine.   Barbiturate, Ur Negative Cutoff=300 ng/mL   Benzodiazepine Quant, Ur Negative Cutoff=300 ng/mL   Cannabinoid Quant,  Ur Negative Cutoff=50 ng/mL   Cocaine (Metab.) Negative Cutoff=300 ng/mL   Opiate Quant, Ur Negative Cutoff=300 ng/mL    Comment: Opiate test includes Codeine and Morphine only.   Phencyclidine, Ur Negative Cutoff=25 ng/mL   Methadone Screen, Urine Negative Cutoff=300 ng/mL   Propoxyphene, Urine Negative Cutoff=300 ng/mL   Ethanol U, Quan Negative Cutoff=0.020 %    Comment: (NOTE) Performed At: [facility] [address] [place], [place] [id number] Avis Epley PhD [id number]   Urine rapid drug screen (hosp performed)     Status: None   Collection Time: 07/18/20  4:15 PM  Result Value Ref Range   Opiates NONE DETECTED NONE DETECTED  Cocaine NONE DETECTED NONE DETECTED   Benzodiazepines NONE DETECTED NONE DETECTED   Amphetamines NONE DETECTED NONE DETECTED   Tetrahydrocannabinol NONE DETECTED NONE DETECTED   Barbiturates NONE DETECTED NONE DETECTED    Comment: (NOTE) DRUG SCREEN FOR MEDICAL PURPOSES ONLY.  IF CONFIRMATION IS NEEDED FOR ANY PURPOSE, NOTIFY LAB WITHIN 5 DAYS.  LOWEST DETECTABLE LIMITS FOR URINE DRUG SCREEN Drug Class                     Cutoff (ng/mL) Amphetamine and metabolites    1000 Barbiturate and metabolites    200 Benzodiazepine                 200 Tricyclics and metabolites     300 Opiates and metabolites        300 Cocaine and metabolites        300 THC                            50 Performed at [facility], [address], [place], [place] [address]   Glucose, capillary     Status: Abnormal   Collection Time: 07/18/20  5:22 PM  Result Value Ref Range   Glucose-Capillary 177 (H) 70 - 99 mg/dL    Comment: Glucose reference range applies only to samples taken after fasting for at least 8 hours.  Glucose, capillary     Status: Abnormal   Collection Time: 07/18/20  8:21 PM  Result Value Ref Range   Glucose-Capillary 121 (H) 70 - 99 mg/dL    Comment: Glucose reference range applies only to samples taken after fasting for  at least 8 hours.   Comment 1 Document in Chart   Glucose, capillary     Status: Abnormal   Collection Time: 07/19/20  7:27 AM  Result Value Ref Range   Glucose-Capillary 262 (H) 70 - 99 mg/dL    Comment: Glucose reference range applies only to samples taken after fasting for at least 8 hours.   Comment 1 Notify RN    Comment 2 Document in Chart   Glucose, capillary     Status: Abnormal   Collection Time: 07/19/20 11:55 AM  Result Value Ref Range   Glucose-Capillary 217 (H) 70 - 99 mg/dL    Comment: Glucose reference range applies only to samples taken after fasting for at least 8 hours.  Glucose, capillary     Status: Abnormal   Collection Time: 07/19/20  4:48 PM  Result Value Ref Range   Glucose-Capillary 110 (H) 70 - 99 mg/dL    Comment: Glucose reference range applies only to samples taken after fasting for at least 8 hours.  Glucose, capillary     Status: Abnormal   Collection Time: 07/19/20 10:30 PM  Result Value Ref Range   Glucose-Capillary 390 (H) 70 - 99 mg/dL    Comment: Glucose reference range applies only to samples taken after fasting for at least 8 hours.  Basic metabolic panel     Status: Abnormal   Collection Time: 07/20/20  3:56 AM  Result Value Ref Range   Sodium 138 135 - 145 mmol/L   Potassium 5.0 3.5 - 5.1 mmol/L   Chloride 102 98 - 111 mmol/L   CO2 26 22 - 32 mmol/L   Glucose, Bld 337 (H) 70 - 99 mg/dL    Comment: Glucose reference range applies only to samples taken after fasting for at least 8 hours.  BUN 12 6 - 20 mg/dL   Creatinine, Ser 1.610.91 0.44 - 1.00 mg/dL   Calcium 9.2 8.9 - 09.610.3 mg/dL   GFR, Estimated >04>60 >54>60 mL/min    Comment: (NOTE) Calculated using the CKD-EPI Creatinine Equation (2021)    Anion gap 10 5 - 15    Comment: Performed at [facility], [address] [place], [place] [address]  CBC     Status: None   Collection Time: 07/20/20  3:56 AM  Result Value Ref Range   WBC 9.3 4.0 - 10.5 K/uL   RBC 4.35 3.87 -  5.11 MIL/uL   Hemoglobin 12.5 12.0 - 15.0 g/dL   HCT 91.438.3 78.236.0 - 95.646.0 %   MCV 88.0 80.0 - 100.0 fL   MCH 28.7 26.0 - 34.0 pg   MCHC 32.6 30.0 - 36.0 g/dL   RDW 21.313.2 08.611.5 - 57.815.5 %   Platelets 195 150 - 400 K/uL   nRBC 0.0 0.0 - 0.2 %    Comment: Performed at [facility], [address] [place], [place] [address]  Magnesium     Status: Abnormal   Collection Time: 07/20/20  3:56 AM  Result Value Ref Range   Magnesium 1.6 (L) 1.7 - 2.4 mg/dL    Comment: Performed at [facility], [address] [place], [place] [address]  Phosphorus     Status: Abnormal   Collection Time: 07/20/20  3:56 AM  Result Value Ref Range   Phosphorus 2.0 (L) 2.5 - 4.6 mg/dL    Comment: Performed at [facility], [address] [place], [place] [address]  Glucose, capillary     Status: Abnormal   Collection Time: 07/20/20  7:52 AM  Result Value Ref Range   Glucose-Capillary 348 (H) 70 - 99 mg/dL    Comment: Glucose reference range applies only to samples taken after fasting for at least 8 hours.   Comment 1 Notify RN    Comment 2 Document in Chart   Glucose, capillary     Status: Abnormal   Collection Time: 07/20/20 11:48 AM  Result Value Ref Range   Glucose-Capillary 221 (H) 70 - 99 mg/dL    Comment: Glucose reference range applies only to samples taken after fasting for at least 8 hours.   Comment 1 Notify RN    Comment 2 Document in Chart     Medications:  Current Facility-Administered Medications  Medication Dose Route Frequency Provider Last Rate Last Admin  . acetaminophen (TYLENOL) tablet 650 mg  650 mg Oral Q6H PRN Mansy, Jan A, MD   650 mg at 07/20/20 1159   Or  . acetaminophen (TYLENOL) suppository 650 mg  650 mg Rectal Q6H PRN Mansy, Jan A, MD      . acidophilus (RISAQUAD) capsule 1 capsule  1 capsule Oral Daily Mansy, Jan A, MD   1 capsule at 07/20/20 0920  . cephALEXin (KEFLEX) capsule 500 mg  500 mg Oral QID Pokhrel, Laxman, MD    500 mg at 07/20/20 0920  . cholecalciferol (VITAMIN D3) tablet 10,000 Units  10,000 Units Oral Daily Mansy, Vernetta HoneyJan A, MD   10,000 Units at 07/20/20 [phone]  . enoxaparin (LOVENOX) injection 40 mg  40 mg Subcutaneous Q24H Mansy, Jan A, MD   40 mg at 07/20/20 0917  . fluticasone (FLONASE) 50 MCG/ACT nasal spray 2 spray  2 spray Each Nare Daily Mansy, Jan A, MD   2 spray at 07/20/20 0916  . insulin aspart (  novoLOG) injection 0-15 Units  0-15 Units Subcutaneous TID PC & HS Mansy, Vernetta Honey, MD   5 Units at 07/20/20 1159  . magic mouthwash  15 mL Oral QID Mansy, Jan A, MD   15 mL at 07/20/20 0981  . magnesium hydroxide (MILK OF MAGNESIA) suspension 30 mL  30 mL Oral Daily PRN Mansy, Jan A, MD   30 mL at 07/18/20 1034  . magnesium oxide (MAG-OX) tablet 400 mg  400 mg Oral BID Pokhrel, Laxman, MD   400 mg at 07/20/20 0919  . metoprolol succinate (TOPROL-XL) 24 hr tablet 12.5 mg  12.5 mg Oral Daily Mansy, Jan A, MD   12.5 mg at 07/20/20 0919  . mometasone-formoterol (DULERA) 200-5 MCG/ACT inhaler 2 puff  2 puff Inhalation BID Mansy, Jan A, MD   2 puff at 07/20/20 1200  . multivitamin with minerals tablet 1 tablet  1 tablet Oral Daily Mansy, Jan A, MD   1 tablet at 07/20/20 1914  . potassium & sodium phosphates (PHOS-NAK) 280-160-250 MG packet 1 packet  1 packet Oral TID WC & HS Pokhrel, Laxman, MD   1 packet at 07/20/20 1200  . promethazine (PHENERGAN) tablet 12.5-25 mg  12.5-25 mg Oral Q8H PRN Mansy, Jan A, MD   25 mg at 07/18/20 2046  . risperiDONE (RISPERDAL) tablet 0.5 mg  0.5 mg Oral BID Maryagnes Amos, FNP   0.5 mg at 07/20/20 7829  . sertraline (ZOLOFT) tablet 150 mg  150 mg Oral Daily Mansy, Jan A, MD   150 mg at 07/20/20 5621  . traZODone (DESYREL) tablet 25 mg  25 mg Oral QHS PRN Mansy, Vernetta Honey, MD   25 mg at 07/18/20 2046    Musculoskeletal: Strength & Muscle Tone: No atrophy noted. Gait & Station: UTA since patient is lying in bed. Patient leans: N/A  Psychiatric Specialty Exam: Physical  Exam Vitals and nursing note reviewed.  Constitutional:      Appearance: She is well-developed.  HENT:     Head: Normocephalic and atraumatic.  Pulmonary:     Effort: Pulmonary effort is normal.  Musculoskeletal:        General: Normal range of motion.     Cervical back: Normal range of motion.  Neurological:     Mental Status: She is alert and oriented to person, place, and time.  Psychiatric:        Mood and Affect: Mood is depressed.        Speech: Speech normal.        Behavior: Behavior is slowed.        Thought Content: Thought content is paranoid. Thought content includes suicidal ideation. Thought content includes suicidal plan.        Cognition and Memory: Memory is impaired.        Judgment: Judgment is impulsive.     Review of Systems  Cardiovascular: Negative for chest pain.  Gastrointestinal: Negative for abdominal pain, constipation, diarrhea, nausea and vomiting.  Psychiatric/Behavioral: Positive for agitation, depression, hallucinations and substance abuse. Negative for suicidal ideas. The patient is nervous/anxious and has insomnia.   All other systems reviewed and are negative.   Blood pressure (!) 143/93, pulse 98, temperature 98.2 F (36.8 C), temperature source Oral, resp. rate 20, height 5\' 5"  (1.651 m), weight 81.2 kg, SpO2 94 %.Body mass index is 29.79 kg/m.  General Appearance: Fairly Groomed, middle aged, Caucasian female, wearing a hospital gown who is lying in bed. NAD.   Eye Contact:  Good  Speech:  Clear and Coherent and Pressured  Volume:  Normal  Mood:  Anxious  Affect:  Appropriate and Congruent  Thought Process:  Goal Directed, Linear and Descriptions of Associations: Intact  Orientation:  Full (Time, Place, and Person)  Thought Content:  Logical, Rumination and Tangential, mild  Suicidal Thoughts:  No  Homicidal Thoughts:  No  Memory:  Immediate;   Fair Recent;   Fair Remote;   Fair  Judgement:  Fair  Insight:  Fair  Psychomotor  Activity:  Normal  Concentration:  Concentration: Good and Attention Span: Good  Recall:  Good  Fund of Knowledge:  Good  Language:  Good  Akathisia:  No  Handed:  Right  AIMS (if indicated):   N/A  Assets:  Communication Skills Housing Physical Health Resilience  ADL's:  Intact  Cognition:  WNL  Sleep:   Poor   Assessment:  EVGENIA MERRIMAN is a 50 y.o. female with pyelonephritis, who has a history of [name] [name] and [name] [name]. On evaluation she is alert and oriented, requesting to leave. She denies any depressive or mania symptoms at present. She remains preoccupied about her pain medications and sons death, she originally reported suicide however this remains unclear.  She denies suicidal ideation, homicidal ideations and or hallucinations. At present she is calm and cooperative, does not appear to be exhibiting any mania, psychosis, or paranoia. She answers all questions appropriately, in and out of sleep. SHe appears to be stable enough to resume outpaitent behavioral health resources.    Treatment Plan Summary: May discharge on Risperdal 0.5 mg BID for mood stabilization/psychosis.   -Continue Trazodone 50 mg qhs PRN for insomnia.  -EKG reviewed and QTc 473. UDS obtained negative for illicit substances to include opiates.  -Recommend SW to refer for behavioral health services at [facility].  -Psychiatry will sign off on patient at this time. Please consult psychiatry again as needed.   Disposition: No evidence of imminent risk to self or others at present.   Patient does not meet criteria for psychiatric inpatient admission. Supportive therapy provided about ongoing stressors. Refer to IOP. Discussed crisis plan, support from social network, calling 911, coming to the Emergency Department, and calling Suicide Hotline. Due to increase in number of ER visits, recommend referral to ACT Team for community management and crisis stabilizations.   This service  was provided via telemedicine using a 2-way, interactive audio and video technology.  Names of all persons participating in this telemedicine service and their role in this encounter. Name:Elster Corbello Rosario Adie Role: PMHNP  Name: Otelia Limes Role: Patient    Maryagnes Amos, FNP 07/20/2020 12:31 PM

## 2020-07-20 NOTE — Progress Notes (Signed)
Patient removed tele monitor and refuses to put monitor back on. Dr. Tyson Babinski notified and central tele notified. Will continue to monitor.

## 2020-07-21 LAB — CBC
HCT: 35.4 % — ABNORMAL LOW (ref 36.0–46.0)
Hemoglobin: 11.3 g/dL — ABNORMAL LOW (ref 12.0–15.0)
MCH: 28.6 pg (ref 26.0–34.0)
MCHC: 31.9 g/dL (ref 30.0–36.0)
MCV: 89.6 fL (ref 80.0–100.0)
Platelets: 178 10*3/uL (ref 150–400)
RBC: 3.95 MIL/uL (ref 3.87–5.11)
RDW: 13.3 % (ref 11.5–15.5)
WBC: 8.4 10*3/uL (ref 4.0–10.5)
nRBC: 0 % (ref 0.0–0.2)

## 2020-07-21 LAB — PHOSPHORUS: Phosphorus: 4.6 mg/dL (ref 2.5–4.6)

## 2020-07-21 LAB — BASIC METABOLIC PANEL
Anion gap: 7 (ref 5–15)
BUN: 21 mg/dL — ABNORMAL HIGH (ref 6–20)
CO2: 31 mmol/L (ref 22–32)
Calcium: 8.9 mg/dL (ref 8.9–10.3)
Chloride: 106 mmol/L (ref 98–111)
Creatinine, Ser: 0.92 mg/dL (ref 0.44–1.00)
GFR, Estimated: >60 mL/min (ref >60)
Glucose, Bld: 115 mg/dL — ABNORMAL HIGH (ref 70–99)
Potassium: 3.9 mmol/L (ref 3.5–5.1)
Sodium: 144 mmol/L (ref 135–145)

## 2020-07-21 LAB — GLUCOSE, CAPILLARY
Glucose-Capillary: 253 mg/dL — ABNORMAL HIGH (ref 70–99)
Glucose-Capillary: 175 mg/dL — ABNORMAL HIGH (ref 70–99)
Glucose-Capillary: 183 mg/dL — ABNORMAL HIGH (ref 70–99)

## 2020-07-21 LAB — MAGNESIUM: Magnesium: 1.8 mg/dL (ref 1.7–2.4)

## 2020-07-21 NOTE — Progress Notes (Signed)
Pt being discharged via safe transport. Pt states she has a bank that she can go to withdraw money to pay for a hotel for the night until she feels better. Discharge instructions and medication education provided to pt.

## 2020-07-21 NOTE — Progress Notes (Signed)
Patient seen and examined at bedside.  No interval complaints reported.  Patient was supposed to be discharged yesterday but due to logistic issues was in the hospital.  No interval complaints.  Vitals from today noted and stable.  Patient is a stable for disposition today.  Spoke at the progression rounds.  Please see discharge summary dated 07/20/2020.

## 2020-07-21 NOTE — TOC Transition Note (Signed)
Transition of Care [facility]) - CM/SW Discharge Note   Patient Details  Name: [name] [name] MRN: [id number] Date of Birth: Dec 29, 1970  Transition of Care [facility]) CM/SW Contact:  Ida Rogue, LCSW Phone Number: 07/21/2020, 8:33 AM   Clinical Narrative:   Patient who is homeless is stable for discharge.  She states she gets a disability check, and was staying in a hotel prior to admission.  [name] also states she has lost her ID, SS card, birth certifcate and bank card and, as such, has a difficult time withdrawing her cash.  She is hopeful for help in getting into a shelter.  She and I filled out form for application to COVID shelter hotel, coordinator [employer], who reviewed and responded "After reviewing the application, it appears she needs assistance in administering her medication. Due to this she won't be able to be accepted into our program."  I informed [name], and she stated she would make other arrangements.  When asked what those were, she said she would go to a bank off of [facility] where she has gotten help with accessing money in the past. I will arrange for her to get there via SAFE transport. TOC sign off.    Final next level of care: Home/Self Care Barriers to Discharge: No Barriers Identified   Patient Goals and CMS Choice        Discharge Placement                       Discharge Plan and Services                                     Social Determinants of Health (SDOH) Interventions     Readmission Risk Interventions Readmission Risk Prevention Plan 12/16/2018  Transportation Screening Complete  Home Care Screening Complete  Some recent data might be hidden

## 2020-07-22 LAB — CULTURE, BLOOD (ROUTINE X 2)
Culture: NO GROWTH
Special Requests: ADEQUATE

## 2020-07-24 ENCOUNTER — Encounter (HOSPITAL_COMMUNITY): Payer: Self-pay

## 2020-07-24 ENCOUNTER — Emergency Department (HOSPITAL_COMMUNITY)
Admission: EM | Admit: 2020-07-24 | Discharge: 2020-07-25 | Disposition: A | Discharge status: Home / Self Care | Payer: Medicaid Other | Attending: Emergency Medicine | Admitting: Emergency Medicine

## 2020-07-24 ENCOUNTER — Other Ambulatory Visit: Payer: Self-pay

## 2020-07-24 DIAGNOSIS — Z794 Long term (current) use of insulin: Secondary | ICD-10-CM | POA: Diagnosis not present

## 2020-07-24 DIAGNOSIS — R519 Headache, unspecified: Secondary | ICD-10-CM | POA: Insufficient documentation

## 2020-07-24 DIAGNOSIS — J45909 Unspecified asthma, uncomplicated: Secondary | ICD-10-CM | POA: Diagnosis not present

## 2020-07-24 DIAGNOSIS — Z7984 Long term (current) use of oral hypoglycemic drugs: Secondary | ICD-10-CM | POA: Insufficient documentation

## 2020-07-24 DIAGNOSIS — E1110 Type 2 diabetes mellitus with ketoacidosis without coma: Secondary | ICD-10-CM | POA: Insufficient documentation

## 2020-07-24 DIAGNOSIS — E669 Obesity, unspecified: Secondary | ICD-10-CM | POA: Diagnosis not present

## 2020-07-24 DIAGNOSIS — Z79899 Other long term (current) drug therapy: Secondary | ICD-10-CM | POA: Diagnosis not present

## 2020-07-24 DIAGNOSIS — E1169 Type 2 diabetes mellitus with other specified complication: Secondary | ICD-10-CM | POA: Diagnosis not present

## 2020-07-24 DIAGNOSIS — I10 Essential (primary) hypertension: Secondary | ICD-10-CM | POA: Diagnosis not present

## 2020-07-24 DIAGNOSIS — F17210 Nicotine dependence, cigarettes, uncomplicated: Secondary | ICD-10-CM | POA: Diagnosis not present

## 2020-07-24 LAB — CBG MONITORING, ED: Glucose-Capillary: 242 mg/dL — ABNORMAL HIGH (ref 70–99)

## 2020-07-24 MED ORDER — ACETAMINOPHEN 500 MG PO TABS
1000.0000 mg | ORAL_TABLET | Freq: Once | ORAL | Status: DC
  Filled 2020-07-24: qty 2

## 2020-07-24 NOTE — ED Provider Notes (Signed)
[facility] DEPT Provider Note  CSN: [id number] Arrival date & time: 07/24/20 2005  Chief Complaint(s) Headache  HPI [name] [name] is a 50 y.o. female   The history is provided by the patient.  Headache Pain location:  L temporal Quality:  Sharp Radiates to:  Does not radiate Onset quality:  Gradual Timing:  Intermittent Progression:  Waxing and waning Chronicity:  Recurrent Relieved by:  Nothing Worsened by:  Nothing Associated symptoms: no cough, no fatigue, no fever, no neck pain, no neck stiffness, no photophobia, no URI and no vomiting     Past Medical History Past Medical History:  Diagnosis Date  . Anxiety disorder   . Asthma   . Bipolar affective disorder ([place])   . Depression   . Diabetes mellitus   . Migraine   . Schizophrenia [facility])    Patient Active Problem List   Diagnosis Date Noted  . Acute pyelonephritis 07/17/2020  . Homelessness 06/12/2020  . Bipolar 1 disorder ([place]) 12/18/2018  . Suicide attempt ([place])   . Overdose, intentional self-harm, initial encounter ([place]) 12/13/2018  . Bipolar I disorder, single manic episode, severe, with psychosis ([place]) 06/09/2018  . Bipolar disorder ([place]) 06/09/2018  . DKA, type 2 ([place]) 01/06/2018  . Diabetic acidosis without coma ([place])   . Adjustment disorder with depressed mood 01/04/2018  . Oral thrush   . Acute lower UTI   . DKA, type 2, not at goal [facility]) 01/03/2018  . Thrush 01/03/2018  . DM (diabetes mellitus), type 2, uncontrolled ([place]) 06/07/2017  . Abscess of groin, right 06/07/2017  . Abscess 06/07/2017  . Bipolar disorder with moderate depression ([place]) 08/31/2015  . Tobacco use disorder 08/28/2015  . HTN (hypertension) 08/28/2015  . Diabetes ([place]) 08/28/2015  . Hyponatremia 08/28/2015  . T2DM (type 2 diabetes mellitus) ([place]) 10/26/2009  . OBESITY 10/26/2009  . Essential hypertension 10/26/2009  . DISTURBANCE OF SKIN SENSATION 10/26/2009  . Migraine headache 03/05/2007    Home Medication(s) Prior to Admission medications   Medication Sig Start Date End Date Taking? Authorizing Provider  APPLE CIDER VINEGAR PO Take 1 capsule by mouth 3 (three) times daily with meals.    [provider]  blood glucose meter kit and supplies Dispense based on patient and insurance preference. Use up to four times daily as directed. (FOR ICD-10 E10.9, E11.9). 01/06/18   Eugenie Filler, MD  cephALEXin (KEFLEX) 500 MG capsule Take 1 capsule (500 mg total) by mouth 4 (four) times daily for 5 days. 07/20/20 07/25/20  Pokhrel, Corrie Mckusick, MD  Cholecalciferol (VITAMIN D3) 250 MCG (10000 UT) capsule Take 10,000 Units by mouth daily.    [provider]  Lactobacillus (PROBIOTIC ACIDOPHILUS) CAPS Take 1 capsule by mouth daily.    [provider]  magnesium oxide (MAG-OX) 400 (241.3 Mg) MG tablet Take 1 tablet (400 mg total) by mouth 2 (two) times daily. 07/20/20   Pokhrel, Corrie Mckusick, MD  metFORMIN (GLUCOPHAGE) 1000 MG tablet Take 0.5 tablets (500 mg total) by mouth 2 (two) times daily. Patient taking differently: Take 1,000 mg by mouth 2 (two) times daily. 10/08/18   Andee Poles, MD  metoprolol succinate (TOPROL-XL) 25 MG 24 hr tablet Take 12.5 mg by mouth daily. 03/24/20   [provider]  Multiple Vitamin (MULTIVITAMIN WITH MINERALS) TABS tablet Take 1 tablet by mouth daily.    [provider]  potassium phosphate, monobasic, (K-PHOS ORIGINAL) 500 MG tablet Take 1 tablet (500 mg total) by mouth 2 (two) times daily  with a meal for 5 days. 07/20/20 07/25/20  Pokhrel, Corrie Mckusick, MD  risperiDONE (RISPERDAL) 0.5 MG tablet Take 1 tablet (0.5 mg total) by mouth 2 (two) times daily. 07/20/20   Pokhrel, Corrie Mckusick, MD  sertraline (ZOLOFT) 100 MG tablet Take 1.5 tablets (150 mg total) by mouth daily. 07/20/20   Pokhrel, Corrie Mckusick, MD  traZODone (DESYREL) 50 MG tablet Take 1 tablet (50 mg total) by mouth at bedtime as needed for sleep. Patient taking differently: Take  50-150 mg by mouth at bedtime as needed for sleep. 12/16/18   Antonieta Pert, MD  albuterol (PROVENTIL HFA;VENTOLIN HFA) 108 (90 Base) MCG/ACT inhaler Inhale 2 puffs into the lungs every 6 (six) hours as needed for wheezing or shortness of breath. Patient not taking: Reported on 12/13/2018 01/06/18 06/04/20  Eugenie Filler, MD  benztropine (COGENTIN) 1 MG tablet Take 1 tablet (1 mg total) by mouth 2 (two) times daily. 12/23/18 06/04/20  Johnn Hai, MD  insulin aspart (NOVOLOG) 100 UNIT/ML injection Inject 0-9 Units into the skin 3 (three) times daily with meals. Patient not taking: Reported on 06/03/2020 12/16/18 06/04/20  Antonieta Pert, MD  mirtazapine (REMERON) 15 MG tablet Take 1 tablet (15 mg total) by mouth at bedtime. Patient not taking: Reported on 06/03/2020 12/22/18 06/04/20  Johnn Hai, MD                                                                                                                                    Past Surgical History Past Surgical History:  Procedure Laterality Date  . KNEE ARTHROSCOPY     x 2  . KNEE SURGERY Left    Incision made and knee cleaned out  . TUBAL LIGATION     Family History Family History  Problem Relation Age of Onset  . Bipolar disorder Mother   . Hypertension Father   . Diabetes Father     Social History Social History   Tobacco Use  . Smoking status: Current Every Day Smoker    Packs/day: 1.00    Years: 29.00    Pack years: 29.00    Types: Cigarettes  . Smokeless tobacco: Never Used  Vaping Use  . Vaping Use: Former  Substance Use Topics  . Alcohol use: Not Currently    Comment: occasionally  . Drug use: No   Allergies Tramadol and Nicotine  Review of Systems Review of Systems  Constitutional: Negative for fatigue and fever.  Eyes: Negative for photophobia.  Respiratory: Negative for cough.   Gastrointestinal: Negative for vomiting.  Musculoskeletal: Negative for neck pain and neck stiffness.  Neurological: Positive for  headaches.   All other systems are reviewed and are negative for acute change except as noted in the HPI  Physical Exam Vital Signs  I have reviewed the triage vital signs BP (!) 179/81   Pulse (!) 102   Temp 98.6 F (37 C) (Oral)   Resp 18  SpO2 98%   Physical Exam Vitals reviewed.  Constitutional:      General: She is not in acute distress.    Appearance: She is well-developed and well-nourished. She is not diaphoretic.  HENT:     Head: Normocephalic and atraumatic.     Right Ear: External ear normal.     Left Ear: External ear normal.     Nose: Nose normal.  Eyes:     General: No scleral icterus.    Extraocular Movements: EOM normal.     Conjunctiva/sclera: Conjunctivae normal.  Neck:     Trachea: Phonation normal.  Cardiovascular:     Rate and Rhythm: Normal rate and regular rhythm.  Pulmonary:     Effort: Pulmonary effort is normal. No respiratory distress.     Breath sounds: No stridor.  Abdominal:     General: There is no distension.  Musculoskeletal:        General: No edema. Normal range of motion.     Cervical back: Normal range of motion.  Neurological:     Mental Status: She is alert and oriented to person, place, and time.     Comments: Mental Status:  Alert and oriented to person, place, and time.  Attention and concentration normal.  Speech clear.  Recent memory is intact  Cranial Nerves:  II Visual Fields: Intact to confrontation. Visual fields intact. III, IV, VI: Pupils equal and reactive to light and near. Full eye movement without nystagmus  V Facial Sensation: Normal. No weakness of masticatory muscles  VII: No facial weakness or asymmetry  VIII Auditory Acuity: Grossly normal  IX/X: The uvula is midline; the palate elevates symmetrically  XI: Normal sternocleidomastoid and trapezius strength  XII: The tongue is midline. No atrophy or fasciculations.   Motor System: Muscle Strength: 5/5 and symmetric in the upper and lower extremities.  No pronation or drift.  Muscle Tone: Tone and muscle bulk are normal in the upper and lower extremities.   Reflexes: DTRs: 1+ and symmetrical in all four extremities. No Clonus Coordination: No tremor.  Sensation: Intact to light toucht.  Gait: Routine gait normal.   Psychiatric:        Mood and Affect: Mood and affect normal.        Behavior: Behavior normal.     ED Results and Treatments Labs (all labs ordered are listed, but only abnormal results are displayed) Labs Reviewed  CBG MONITORING, ED - Abnormal; Notable for the following components:      Result Value   Glucose-Capillary 242 (*)    All other components within normal limits                                                                                                                         EKG  EKG Interpretation  Date/Time:    Ventricular Rate:    PR Interval:    QRS Duration:   QT Interval:    QTC Calculation:   R Axis:  Text Interpretation:        Radiology No results found.  Pertinent labs & imaging results that were available during my care of the patient were reviewed by me and considered in my medical decision making (see chart for details).  Medications Ordered in ED Medications  acetaminophen (TYLENOL) tablet 1,000 mg (1,000 mg Oral Patient Refused/Not Given 07/24/20 2349)                                                                                                                                    Procedures Procedures  (including critical care time)  Medical Decision Making / ED Course I have reviewed the nursing notes for this encounter and the patient's prior records (if available in EHR or on provided paperwork).   [name] [name] was evaluated in Emergency Department on 07/25/2020 for the symptoms described in the history of present illness. She was evaluated in the context of the global COVID-19 pandemic, which necessitated consideration that the patient might be at risk for  infection with the SARS-CoV-2 virus that causes COVID-19. Institutional protocols and algorithms that pertain to the evaluation of patients at risk for COVID-19 are in a state of rapid change based on information released by regulatory bodies including the CDC and federal and state organizations. These policies and algorithms were followed during the patient's care in the ED.  Typical headache for the pt. Non focal neuro exam. No recent head trauma. No fever. Doubt meningitis. Doubt intracranial bleed. Doubt IIH. No indication for imaging.  Offered tylenol, but she refused.        Final Clinical Impression(s) / ED Diagnoses Final diagnoses:  Headache disorder   The patient appears reasonably screened and/or stabilized for discharge and I doubt any other medical condition or other [facility] requiring further screening, evaluation, or treatment in the ED at this time prior to discharge. Safe for discharge with strict return precautions.  Disposition: Discharge  Condition: Good  I have discussed the results, Dx and Tx plan with the patient/family who expressed understanding and agree(s) with the plan. Discharge instructions discussed at length. The patient/family was given strict return precautions who verbalized understanding of the instructions. No further questions at time of discharge.    ED Discharge Orders    None       Follow Up: Primary care provider  Call  To schedule an appointment for close follow up      This chart was dictated using voice recognition software.  Despite best efforts to proofread,  errors can occur which can change the documentation meaning.   Fatima Blank, MD 07/25/20 [phone]
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
## 2020-07-25 NOTE — ED Notes (Signed)
Pt escorted to lobby accompanied by security.

## 2020-07-25 NOTE — ED Notes (Signed)
Patient requested coffee and as this Clinical research associate approached patient with coffee. This Clinical research associate explained to patient that she can not spray perfume in the hospital. Pt yelled back at this writer stating "oh my god. I know. I put it up. What do you want me to say ? Happy black history month for you bringing me my coffee?" Pt was not given coffee after interaction with staff

## 2020-07-30 ENCOUNTER — Emergency Department (HOSPITAL_COMMUNITY): Payer: Medicaid Other

## 2020-07-30 ENCOUNTER — Encounter (HOSPITAL_COMMUNITY): Payer: Self-pay | Admitting: Emergency Medicine

## 2020-07-30 ENCOUNTER — Emergency Department (HOSPITAL_COMMUNITY)
Admission: EM | Admit: 2020-07-30 | Discharge: 2020-07-30 | Disposition: A | Discharge status: Home / Self Care | Payer: Medicaid Other | Attending: Emergency Medicine | Admitting: Emergency Medicine

## 2020-07-30 ENCOUNTER — Emergency Department (HOSPITAL_COMMUNITY)
Admission: EM | Admit: 2020-07-30 | Discharge: 2020-07-30 | Disposition: A | Discharge status: Home / Self Care | Payer: Medicaid Other | Source: Home / Self Care | Attending: Emergency Medicine | Admitting: Emergency Medicine

## 2020-07-30 ENCOUNTER — Encounter (HOSPITAL_COMMUNITY): Payer: Self-pay

## 2020-07-30 ENCOUNTER — Other Ambulatory Visit: Payer: Self-pay

## 2020-07-30 DIAGNOSIS — Z7984 Long term (current) use of oral hypoglycemic drugs: Secondary | ICD-10-CM | POA: Insufficient documentation

## 2020-07-30 DIAGNOSIS — R4589 Other symptoms and signs involving emotional state: Secondary | ICD-10-CM | POA: Diagnosis not present

## 2020-07-30 DIAGNOSIS — Z5900 Homelessness unspecified: Secondary | ICD-10-CM | POA: Insufficient documentation

## 2020-07-30 DIAGNOSIS — Z885 Allergy status to narcotic agent status: Secondary | ICD-10-CM | POA: Insufficient documentation

## 2020-07-30 DIAGNOSIS — R519 Headache, unspecified: Secondary | ICD-10-CM | POA: Insufficient documentation

## 2020-07-30 DIAGNOSIS — R739 Hyperglycemia, unspecified: Secondary | ICD-10-CM

## 2020-07-30 DIAGNOSIS — Z888 Allergy status to other drugs, medicaments and biological substances status: Secondary | ICD-10-CM | POA: Diagnosis not present

## 2020-07-30 DIAGNOSIS — E1110 Type 2 diabetes mellitus with ketoacidosis without coma: Secondary | ICD-10-CM | POA: Insufficient documentation

## 2020-07-30 DIAGNOSIS — F4321 Adjustment disorder with depressed mood: Secondary | ICD-10-CM | POA: Diagnosis present

## 2020-07-30 DIAGNOSIS — Z794 Long term (current) use of insulin: Secondary | ICD-10-CM | POA: Insufficient documentation

## 2020-07-30 DIAGNOSIS — J45909 Unspecified asthma, uncomplicated: Secondary | ICD-10-CM | POA: Insufficient documentation

## 2020-07-30 DIAGNOSIS — E1165 Type 2 diabetes mellitus with hyperglycemia: Secondary | ICD-10-CM | POA: Insufficient documentation

## 2020-07-30 DIAGNOSIS — I10 Essential (primary) hypertension: Secondary | ICD-10-CM | POA: Insufficient documentation

## 2020-07-30 DIAGNOSIS — R6889 Other general symptoms and signs: Secondary | ICD-10-CM

## 2020-07-30 DIAGNOSIS — F319 Bipolar disorder, unspecified: Secondary | ICD-10-CM | POA: Diagnosis present

## 2020-07-30 DIAGNOSIS — F17210 Nicotine dependence, cigarettes, uncomplicated: Secondary | ICD-10-CM | POA: Insufficient documentation

## 2020-07-30 DIAGNOSIS — Z79899 Other long term (current) drug therapy: Secondary | ICD-10-CM | POA: Diagnosis not present

## 2020-07-30 DIAGNOSIS — M79606 Pain in leg, unspecified: Secondary | ICD-10-CM | POA: Insufficient documentation

## 2020-07-30 LAB — URINALYSIS, ROUTINE W REFLEX MICROSCOPIC
Bilirubin Urine: NEGATIVE
Glucose, UA: >=500 mg/dL — AB
Hgb urine dipstick: NEGATIVE
Ketones, ur: NEGATIVE mg/dL
Nitrite: NEGATIVE
Protein, ur: NEGATIVE mg/dL
Specific Gravity, Urine: 1.007 (ref 1.005–1.030)
pH: 6 (ref 5.0–8.0)

## 2020-07-30 LAB — PREGNANCY, URINE: Preg Test, Ur: NEGATIVE

## 2020-07-30 LAB — RAPID URINE DRUG SCREEN, HOSP PERFORMED
Amphetamines: NOT DETECTED
Barbiturates: NOT DETECTED
Benzodiazepines: NOT DETECTED
Cocaine: NOT DETECTED
Opiates: NOT DETECTED
Tetrahydrocannabinol: NOT DETECTED

## 2020-07-30 LAB — CBC WITH DIFFERENTIAL/PLATELET
Abs Immature Granulocytes: 0.03 10*3/uL (ref 0.00–0.07)
Basophils Absolute: 0.1 10*3/uL (ref 0.0–0.1)
Basophils Relative: 1 %
Eosinophils Absolute: 0.3 10*3/uL (ref 0.0–0.5)
Eosinophils Relative: 3 %
HCT: 36.5 % (ref 36.0–46.0)
Hemoglobin: 11.7 g/dL — ABNORMAL LOW (ref 12.0–15.0)
Immature Granulocytes: 0 %
Lymphocytes Relative: 28 %
Lymphs Abs: 2.3 10*3/uL (ref 0.7–4.0)
MCH: 28.7 pg (ref 26.0–34.0)
MCHC: 32.1 g/dL (ref 30.0–36.0)
MCV: 89.5 fL (ref 80.0–100.0)
Monocytes Absolute: 0.6 10*3/uL (ref 0.1–1.0)
Monocytes Relative: 7 %
Neutro Abs: 4.8 10*3/uL (ref 1.7–7.7)
Neutrophils Relative %: 61 %
Platelets: 237 10*3/uL (ref 150–400)
RBC: 4.08 MIL/uL (ref 3.87–5.11)
RDW: 14.6 % (ref 11.5–15.5)
WBC: 8 10*3/uL (ref 4.0–10.5)
nRBC: 0 % (ref 0.0–0.2)

## 2020-07-30 LAB — BASIC METABOLIC PANEL
Anion gap: 10 (ref 5–15)
BUN: 18 mg/dL (ref 6–20)
CO2: 22 mmol/L (ref 22–32)
Calcium: 8.6 mg/dL — ABNORMAL LOW (ref 8.9–10.3)
Chloride: 106 mmol/L (ref 98–111)
Creatinine, Ser: 0.71 mg/dL (ref 0.44–1.00)
GFR, Estimated: >60 mL/min (ref >60)
Glucose, Bld: 301 mg/dL — ABNORMAL HIGH (ref 70–99)
Potassium: 3.5 mmol/L (ref 3.5–5.1)
Sodium: 138 mmol/L (ref 135–145)

## 2020-07-30 LAB — CBG MONITORING, ED
Glucose-Capillary: 173 mg/dL — ABNORMAL HIGH (ref 70–99)
Glucose-Capillary: 193 mg/dL — ABNORMAL HIGH (ref 70–99)
Glucose-Capillary: 319 mg/dL — ABNORMAL HIGH (ref 70–99)

## 2020-07-30 LAB — ETHANOL: Alcohol, Ethyl (B): <10 mg/dL (ref <10)

## 2020-07-30 MED ORDER — METFORMIN HCL 500 MG PO TABS
1000.0000 mg | ORAL_TABLET | Freq: Two times a day (BID) | ORAL | Status: DC
  Administered 2020-07-30: 1000 mg via ORAL
  Filled 2020-07-30: qty 2

## 2020-07-30 MED ORDER — INSULIN ASPART 100 UNIT/ML ~~LOC~~ SOLN
10.0000 [IU] | Freq: Once | SUBCUTANEOUS | Status: AC
  Administered 2020-07-30: 10 [IU] via SUBCUTANEOUS
  Filled 2020-07-30: qty 0.1

## 2020-07-30 MED ORDER — METFORMIN HCL 500 MG PO TABS: 1000.0000 mg | ORAL_TABLET | Freq: Two times a day (BID) | ORAL | Status: DC

## 2020-07-30 MED ORDER — SODIUM CHLORIDE 0.9 % IV BOLUS
1000.0000 mL | Freq: Once | INTRAVENOUS | Status: AC
  Administered 2020-07-30: 1000 mL via INTRAVENOUS

## 2020-07-30 MED ORDER — ACETAMINOPHEN 500 MG PO TABS
1000.0000 mg | ORAL_TABLET | Freq: Once | ORAL | Status: DC
  Filled 2020-07-30: qty 2

## 2020-07-30 MED ORDER — METOPROLOL SUCCINATE ER 25 MG PO TB24
12.5000 mg | ORAL_TABLET | Freq: Every day | ORAL | Status: DC
  Administered 2020-07-30: 12.5 mg via ORAL
  Filled 2020-07-30: qty 0.5

## 2020-07-30 NOTE — ED Notes (Signed)
Assumed care of this patient. Pt discharged and given AVS. Pt states" want think is the doctor with the white hair going to be here. Ill go back to the lobby and check myself back in". Vitals taken. NAD. A&ox4. Ambulated with steady gait out discharge door. Pt refused medications.
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
## 2020-07-30 NOTE — ED Notes (Signed)
Patient completed TTS consult.

## 2020-07-30 NOTE — ED Provider Notes (Signed)
7:09 AM Care assumed from Dr. Read Drivers.  At time of transfer of care, patient is awaiting for recommendations from psychiatry and social work.  Patient has been medically cleared for hyperglycemia and recent urine infection.  At this time, patient is here voluntarily and report from previous team is that if she tries to leave, she will be allowed to go without IVC as she did not appear to be a true threat to herself or others.  1:26 PM Chart view shows that she has now been psychiatrically and medically cleared.  Social work provided resources for homelessness and shelters.  Patient will be discharged to follow-up with outpatient teams.  She is not under IVC and was discharged   Clinical Impression: 1. Hyperglycemia   2. Depressed mood   3. Homelessness     Disposition: Discharge  Condition: Good  I have discussed the results, Dx and Tx plan with the pt(& family if present). He/she/they expressed understanding and agree(s) with the plan. Discharge instructions discussed at great length. Strict return precautions discussed and pt &/or family have verbalized understanding of the instructions. No further questions at time of discharge.    New Prescriptions   No medications on file    Follow Up: [facility] [address] [place] [address] [phone]          Tegeler, Canary Brim, MD 07/30/20 [phone]

## 2020-07-30 NOTE — ED Triage Notes (Addendum)
Pt states that she is dizzy and her CBG is high. Pt has been out of medication due to homelessness. CBG via EMS 407, CBG upon arrival 319. Pt had 200cc fluid via EMS

## 2020-07-30 NOTE — ED Notes (Signed)
Pt had to be assisted back in the department as she made her way outside to enjoy a cigarette.

## 2020-07-30 NOTE — ED Triage Notes (Signed)
Patient reports headache "all my life" and burning with urination x3 months. States she has been homeless since November.

## 2020-07-30 NOTE — Discharge Instructions (Signed)
Please follow-up with the outpatient behavioral health team and monitor your glucose.  Please rest and stay hydrated.  If any symptoms change or worsen, please return to the emergency department.

## 2020-07-30 NOTE — [facility] Suicide Risk Assessment (Cosign Needed Addendum)
Suicide Risk Assessment  Discharge Assessment   [employer] Discharge Suicide Risk Assessment   Principal Problem: Adjustment disorder with depressed mood Discharge Diagnoses: Principal Problem:   Adjustment disorder with depressed mood Active Problems:   Bipolar disorder (HCC)   Homelessness   [name] [name], 50 y.o., female patient presented to [facility] for medical evaluation of lightheaded and dizziness, also mentioned other non-related medical concerns.  She has medical history for DM and blood sugar was elevated on arrival, 301. At that time she also endorsed concerns for depression, homelessness but no suicidal or homicidal ideations, TTS consult was entered for further evaluation.Patient seen via telepsych by this provider; chart reviewed and consulted with Dr. Lucianne Muss on 07/30/20.    On evaluation [name] [name] reports she is primarily concerned about her chronic leg pain and homelessness.  Tells me she's been homelessness since November of last year, essentially living with various friends, family and hotels.  She has a son who lives locally," has schizophrenia" and puts her out when his friends come to visit.  Per SW note, patient historically evicted from several types of housing including section 8 and hotels.  Endorses intermittent sadness that correlates with homelessness.  That improves when she takes her medications.  She has a history for bipolar disorder, per chart review is prescribed risperidone 0.5mg  BID; sertraline 100mg  daily and trazodone 50mg  po qhs, reports she takes these meds when she remembers.  Also displaces meds often due to homelessness and frequent moves.  She has a history for similar presentation, this is essentially her baseline in lieu of medication non-adherence.  She is not suicidal or homicidal, and there are no imminent safety concerns. She defers restarting meds for bipolar dx today, but agrees to follow-up for outpatient care at the [facility] for both medication and  therapy.  She would benefit from an ACTT and this was also discussed with her.  Per nursing report, patient seen outside smoking, several times today, since admission, no apparent distress. Otherwise, no significant behavioral problems since admission.    Labs: UDS is negative   Per EDP Admission Assessment 07/30/2020@ 1217am [name] [name] is a 50 y.o. female with a history of diabetes, mental illness, and homelessness.  She is here with elevated blood sugar which was noted to be 407 by EMS and 319 on arrival here.  Patient was given 200 mL of normal saline IV by EMS prior to arrival.  She is also dizzy (lightheaded) with this.  She believes her symptoms are to to her not being able to get her usual medications.  He has been homeless since December 25 of last year.  She states she has felt confused.  Does not know what day it is (she knows it is 2022 and thinks it is 17 February).  She has chronic pain in her left leg, on which she has had multiple operations, which is making it difficult to walk.  She denies SI or HI but feels depressed, hopeless and powerless.  She has painful lesions on both sides of her tongue.  She does not believe she bites her tongue in her sleep but wonders if maybe she has had seizures.  She denies drug or alcohol use.    Total Time spent with patient: 30 minutes  Musculoskeletal: Strength & Muscle Tone: within normal limits Gait & Station: normal Patient leans: Right  Psychiatric Specialty Exam:   Blood pressure (!) 124/91, pulse (!) 109, temperature 97.9 F (36.6 C), temperature source Oral, resp.  rate 17, height 5\' 5"  (1.651 m), weight 81.2 kg, SpO2 96 %.Body mass index is 29.79 kg/m.  General Appearance: Casual and Fairly Groomed  Eye Contact::  Good  Speech:  Clear and Coherent and Normal Rate  Volume:  Normal  Mood: mildly Anxious  Affect:  Appropriate and Congruent  Thought Process:  Goal Directed and Descriptions of Associations: Circumstantial   Orientation:  Full (Time, Place, and Person)  Thought Content:  Logical and Rumination on chronic leg pain  Suicidal Thoughts:  No  Homicidal Thoughts:  No  Memory:  Immediate;   Good Recent;   Good Remote;   Good  Judgement:  Fair, appears baseline in lieu of med non-compliance  Insight:  Fair, appears baseline in lieu of med non-compliance  Psychomotor Activity:  Normal  Concentration:  Good  Recall:  Good  Fund of Knowledge:Good  Language: Good  Akathisia:  NA  Handed:  Right  AIMS (if indicated):     Assets:  Communication Skills Desire for Improvement  Sleep:   <6 hours  Cognition: WNL  ADL's:  Intact   Mental Status Per Nursing Assessment::   On Admission:     Demographic Factors:  Low socioeconomic status  Loss Factors: Financial problems/change in socioeconomic status  Historical Factors: Impulsivity  Risk Reduction Factors:   NA  Continued Clinical Symptoms:  Previous Psychiatric Diagnoses and Treatments  Cognitive Features That Contribute To Risk:  None    Suicide Risk:  Minimal: No identifiable suicidal ideation.  Patients presenting with no risk factors but with morbid ruminations; may be classified as minimal risk based on the severity of the depressive symptoms   Disposition: No evidence of imminent risk to self or others at present.   Patient does not meet criteria for psychiatric inpatient admission. Supportive therapy provided about ongoing stressors. Discussed crisis plan, support from social network, calling 911, coming to the Emergency Department, and calling Suicide Hotline.  Plan Of Care/Follow-up recommendations:  Plan- As per above assessment , there are no current grounds for involuntary commitment at this time.?  Patient is not currently interested in inpatient services, but expresses agreement to continue outpatient treatment - we have reviewed importance of  medication compliance. ? Asked SW to provide outpatient resources to  follow-up with [facility] for medication management and therapy.   Spoke with Dr.Teglier, EDP: [facility], RN; and Ava Stevenson-SW via Rosario Adie; informed of above recommendation and disposition   [employer], NP 07/30/2020, 11:36 AM

## 2020-07-30 NOTE — Discharge Instructions (Signed)
Can take tylenol or motrin for pain. Seek housing at one of the shelters. Return to the ED for new or worsening symptoms.
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
## 2020-07-30 NOTE — ED Notes (Signed)
Patient asked multiple times for a urine sample and said she could not provide one at this time.

## 2020-07-30 NOTE — ED Provider Notes (Signed)
[place] DEPT Provider Note: Georgena Spurling, MD, FACEP  CSN: [id number] MRN: [id number] ARRIVAL: 07/30/20 at 0001 ROOM: WA14/WA14   CHIEF COMPLAINT  Hyperglycemia   HISTORY OF PRESENT ILLNESS  07/30/20 12:17 AM [name] [name] is a 50 y.o. female with a history of diabetes, mental illness, and homelessness.  She is here with elevated blood sugar which was noted to be 407 by EMS and 319 on arrival here.  Patient was given 200 mL of normal saline IV by EMS prior to arrival.  She is also dizzy (lightheaded) with this.  She believes her symptoms are to to her not being able to get her usual medications.  He has been homeless since December 25 of last year.  She states she has felt confused.  Does not know what day it is (she knows it is 2022 and thinks it is 17 February).  She has chronic pain in her left leg, on which she has had multiple operations, which is making it difficult to walk.  She denies SI or HI but feels depressed, hopeless and powerless.  She has painful lesions on both sides of her tongue.  She does not believe she bites her tongue in her sleep but wonders if maybe she has had seizures.  She denies drug or alcohol use.    Past Medical History:  Diagnosis Date  . Anxiety disorder   . Asthma   . Bipolar affective disorder ([place])   . Depression   . Diabetes mellitus   . Migraine   . Schizophrenia [facility])     Past Surgical History:  Procedure Laterality Date  . KNEE ARTHROSCOPY     x 2  . KNEE SURGERY Left    Incision made and knee cleaned out  . TUBAL LIGATION      Family History  Problem Relation Age of Onset  . Bipolar disorder Mother   . Hypertension Father   . Diabetes Father     Social History   Tobacco Use  . Smoking status: Current Every Day Smoker    Packs/day: 1.00    Years: 29.00    Pack years: 29.00    Types: Cigarettes  . Smokeless tobacco: Never Used  Vaping Use  . Vaping Use: Former  Substance Use Topics  . Alcohol use: Not Currently     Comment: occasionally  . Drug use: No    Prior to Admission medications   Medication Sig Start Date End Date Taking? Authorizing Provider  APPLE CIDER VINEGAR PO Take 1 capsule by mouth 3 (three) times daily with meals.    [provider]  blood glucose meter kit and supplies Dispense based on patient and insurance preference. Use up to four times daily as directed. (FOR ICD-10 E10.9, E11.9). 01/06/18   Eugenie Filler, MD  Cholecalciferol (VITAMIN D3) 250 MCG (10000 UT) capsule Take 10,000 Units by mouth daily.    [provider]  Lactobacillus (PROBIOTIC ACIDOPHILUS) CAPS Take 1 capsule by mouth daily.    [provider]  magnesium oxide (MAG-OX) 400 (241.3 Mg) MG tablet Take 1 tablet (400 mg total) by mouth 2 (two) times daily. 07/20/20   Pokhrel, Corrie Mckusick, MD  metFORMIN (GLUCOPHAGE) 1000 MG tablet Take 0.5 tablets (500 mg total) by mouth 2 (two) times daily. Patient taking differently: Take 1,000 mg by mouth 2 (two) times daily. 10/08/18   Andee Poles, MD  metoprolol succinate (TOPROL-XL) 25 MG 24 hr tablet Take 12.5 mg by mouth daily. 03/24/20  [provider]  Multiple Vitamin (MULTIVITAMIN WITH MINERALS) TABS tablet Take 1 tablet by mouth daily.    [provider]  risperiDONE (RISPERDAL) 0.5 MG tablet Take 1 tablet (0.5 mg total) by mouth 2 (two) times daily. 07/20/20   Pokhrel, Corrie Mckusick, MD  sertraline (ZOLOFT) 100 MG tablet Take 1.5 tablets (150 mg total) by mouth daily. 07/20/20   Pokhrel, Corrie Mckusick, MD  traZODone (DESYREL) 50 MG tablet Take 1 tablet (50 mg total) by mouth at bedtime as needed for sleep. Patient taking differently: Take 50-150 mg by mouth at bedtime as needed for sleep. 12/16/18   Antonieta Pert, MD  albuterol (PROVENTIL HFA;VENTOLIN HFA) 108 (90 Base) MCG/ACT inhaler Inhale 2 puffs into the lungs every 6 (six) hours as needed for wheezing or shortness of breath. Patient not taking: Reported on 12/13/2018 01/06/18 06/04/20   Eugenie Filler, MD  benztropine (COGENTIN) 1 MG tablet Take 1 tablet (1 mg total) by mouth 2 (two) times daily. 12/23/18 06/04/20  Johnn Hai, MD  insulin aspart (NOVOLOG) 100 UNIT/ML injection Inject 0-9 Units into the skin 3 (three) times daily with meals. Patient not taking: Reported on 06/03/2020 12/16/18 06/04/20  Antonieta Pert, MD  mirtazapine (REMERON) 15 MG tablet Take 1 tablet (15 mg total) by mouth at bedtime. Patient not taking: Reported on 06/03/2020 12/22/18 06/04/20  Johnn Hai, MD    Allergies Tramadol and Nicotine   REVIEW OF SYSTEMS  Negative except as noted here or in the History of Present Illness.   PHYSICAL EXAMINATION  Initial Vital Signs Blood pressure (!) 168/108, pulse (!) 102, temperature 98.1 F (36.7 C), temperature source Oral, resp. rate 16, height $RemoveBe'5\' 5"'bYPrdTjUD$  (1.651 m), weight 81.2 kg, SpO2 99 %.  Examination General: Well-developed, well-nourished female in no acute distress; appearance consistent with age of record HENT: normocephalic; atraumatic Eyes: pupils equal, round and reactive to light; extraocular muscles intact Neck: supple Heart: regular rate and rhythm Lungs: clear to auscultation bilaterally Abdomen: soft; nondistended; nontender; bowel sounds present Extremities: No deformity; full range of motion; pulses normal Neurologic: Awake, alert and oriented to person, place, year and month but not date or day of week; motor function intact in all extremities and symmetric; no facial droop Skin: Warm and dry Psychiatric: Depressed mood with congruent affect; emotionally labile; denies SI, HI   RESULTS  Summary of this visit's results, reviewed and interpreted by myself:   EKG Interpretation  Date/Time:    Ventricular Rate:    PR Interval:    QRS Duration:   QT Interval:    QTC Calculation:   R Axis:     Text Interpretation:        Laboratory Studies: Results for orders placed or performed during the hospital encounter of 07/30/20  (from the past 24 hour(s))  CBG monitoring, ED     Status: Abnormal   Collection Time: 07/30/20 12:08 AM  Result Value Ref Range   Glucose-Capillary 319 (H) 70 - 99 mg/dL  CBC with Differential/Platelet     Status: Abnormal   Collection Time: 07/30/20 12:20 AM  Result Value Ref Range   WBC 8.0 4.0 - 10.5 K/uL   RBC 4.08 3.87 - 5.11 MIL/uL   Hemoglobin 11.7 (L) 12.0 - 15.0 g/dL   HCT 36.5 36.0 - 46.0 %   MCV 89.5 80.0 - 100.0 fL   MCH 28.7 26.0 - 34.0 pg   MCHC 32.1 30.0 - 36.0 g/dL   RDW 14.6 11.5 - 15.5 %   Platelets 237  150 - 400 K/uL   nRBC 0.0 0.0 - 0.2 %   Neutrophils Relative % 61 %   Neutro Abs 4.8 1.7 - 7.7 K/uL   Lymphocytes Relative 28 %   Lymphs Abs 2.3 0.7 - 4.0 K/uL   Monocytes Relative 7 %   Monocytes Absolute 0.6 0.1 - 1.0 K/uL   Eosinophils Relative 3 %   Eosinophils Absolute 0.3 0.0 - 0.5 K/uL   Basophils Relative 1 %   Basophils Absolute 0.1 0.0 - 0.1 K/uL   Immature Granulocytes 0 %   Abs Immature Granulocytes 0.03 0.00 - 0.07 K/uL  Basic metabolic panel     Status: Abnormal   Collection Time: 07/30/20 12:20 AM  Result Value Ref Range   Sodium 138 135 - 145 mmol/L   Potassium 3.5 3.5 - 5.1 mmol/L   Chloride 106 98 - 111 mmol/L   CO2 22 22 - 32 mmol/L   Glucose, Bld 301 (H) 70 - 99 mg/dL   BUN 18 6 - 20 mg/dL   Creatinine, Ser 0.71 0.44 - 1.00 mg/dL   Calcium 8.6 (L) 8.9 - 10.3 mg/dL   GFR, Estimated >60 >60 mL/min   Anion gap 10 5 - 15  Urinalysis, Routine w reflex microscopic Urine, Clean Catch     Status: Abnormal   Collection Time: 07/30/20 12:22 AM  Result Value Ref Range   Color, Urine STRAW (A) YELLOW   APPearance CLEAR CLEAR   Specific Gravity, Urine 1.007 1.005 - 1.030   pH 6.0 5.0 - 8.0   Glucose, UA >=500 (A) NEGATIVE mg/dL   Hgb urine dipstick NEGATIVE NEGATIVE   Bilirubin Urine NEGATIVE NEGATIVE   Ketones, ur NEGATIVE NEGATIVE mg/dL   Protein, ur NEGATIVE NEGATIVE mg/dL   Nitrite NEGATIVE NEGATIVE   Leukocytes,Ua MODERATE (A)  NEGATIVE   RBC / HPF 0-5 0 - 5 RBC/hpf   WBC, UA 0-5 0 - 5 WBC/hpf   Bacteria, UA RARE (A) NONE SEEN   Squamous Epithelial / LPF 0-5 0 - 5   Mucus PRESENT   Pregnancy, urine     Status: None   Collection Time: 07/30/20 12:22 AM  Result Value Ref Range   Preg Test, Ur NEGATIVE NEGATIVE  Ethanol     Status: None   Collection Time: 07/30/20 12:33 AM  Result Value Ref Range   Alcohol, Ethyl (B) <10 <10 mg/dL  Rapid urine drug screen (hospital performed)     Status: None   Collection Time: 07/30/20  4:04 AM  Result Value Ref Range   Opiates NONE DETECTED NONE DETECTED   Cocaine NONE DETECTED NONE DETECTED   Benzodiazepines NONE DETECTED NONE DETECTED   Amphetamines NONE DETECTED NONE DETECTED   Tetrahydrocannabinol NONE DETECTED NONE DETECTED   Barbiturates NONE DETECTED NONE DETECTED  CBG monitoring, ED     Status: Abnormal   Collection Time: 07/30/20  4:26 AM  Result Value Ref Range   Glucose-Capillary 173 (H) 70 - 99 mg/dL   Imaging Studies: CT Head Wo Contrast  Result Date: 07/30/2020 CLINICAL DATA:  Dizziness. EXAM: CT HEAD WITHOUT CONTRAST TECHNIQUE: Contiguous axial images were obtained from the base of the skull through the vertex without intravenous contrast. COMPARISON:  June 04, 2020 FINDINGS: Brain: No evidence of acute hemorrhage, hydrocephalus, extra-axial collection or mass lesion/mass effect. A small, ill-defined area of white matter low attenuation is seen within the basal ganglia on the right. This is not clearly identified on the prior study. There is no evidence of associated  mass effect or midline shift. Vascular: No hyperdense vessel or unexpected calcification. Skull: Normal. Negative for fracture or focal lesion. Sinuses/Orbits: No acute finding. Other: None. IMPRESSION: Small area of white matter low attenuation within the right basal ganglia, not clearly identified on the prior study. While this may represent a small area of chronic microvascular ischemic  disease, correlation with MRI is recommended. Electronically Signed   By: Virgina Norfolk M.D.   On: 07/30/2020 01:50    ED COURSE and MDM  Nursing notes, initial and subsequent vitals signs, including pulse oximetry, reviewed and interpreted by myself.  Vitals:   07/30/20 0012 07/30/20 0245  BP: (!) 168/108 139/85  Pulse: (!) 102 (!) 106  Resp: 16 14  Temp: 98.1 F (36.7 C)   TempSrc: Oral   SpO2: 99% 96%  Weight: 81.2 kg   Height: $Remove'5\' 5"'MeKHmti$  (1.651 m)    Medications  insulin aspart (novoLOG) injection 10 Units (10 Units Subcutaneous Given 07/30/20 0039)  sodium chloride 0.9 % bolus 1,000 mL (0 mLs Intravenous Stopped 07/30/20 0410)  sodium chloride 0.9 % bolus 1,000 mL (1,000 mLs Intravenous New Bag/Given 07/30/20 0419)   5:03 AM Blood sugar improved with IV hydration and subcu insulin.  We will have patient evaluated by TTS for her depression as well as by social work for her social issues.  Note: Patient was admitted for pyelonephritis on 07/17/2020 and had prescriptions written at time of discharge.   PROCEDURES  Procedures   ED DIAGNOSES     ICD-10-CM   1. Hyperglycemia  R73.9   2. Depressed mood  R45.89   3. Homelessness  Z59.00        Azra Abrell, Jenny Reichmann, MD 07/30/20 [phone]

## 2020-07-30 NOTE — Care Management (Signed)
Consult placed for medication assistance. RNCM reviewed record and noted patient has [employer] which which will cover her prescriptions, and if there is a co-pay she can ask pharmacy to waive the copay.

## 2020-07-31 ENCOUNTER — Encounter (HOSPITAL_COMMUNITY): Payer: Self-pay

## 2020-07-31 ENCOUNTER — Emergency Department (HOSPITAL_COMMUNITY)
Admission: EM | Admit: 2020-07-31 | Discharge: 2020-07-31 | Disposition: A | Discharge status: Home / Self Care | Payer: Medicaid Other | Attending: Emergency Medicine | Admitting: Emergency Medicine

## 2020-07-31 DIAGNOSIS — Z79899 Other long term (current) drug therapy: Secondary | ICD-10-CM | POA: Diagnosis not present

## 2020-07-31 DIAGNOSIS — R059 Cough, unspecified: Secondary | ICD-10-CM | POA: Insufficient documentation

## 2020-07-31 DIAGNOSIS — I10 Essential (primary) hypertension: Secondary | ICD-10-CM | POA: Diagnosis not present

## 2020-07-31 DIAGNOSIS — E119 Type 2 diabetes mellitus without complications: Secondary | ICD-10-CM | POA: Insufficient documentation

## 2020-07-31 DIAGNOSIS — G8929 Other chronic pain: Secondary | ICD-10-CM | POA: Diagnosis not present

## 2020-07-31 DIAGNOSIS — J45909 Unspecified asthma, uncomplicated: Secondary | ICD-10-CM | POA: Insufficient documentation

## 2020-07-31 DIAGNOSIS — F17210 Nicotine dependence, cigarettes, uncomplicated: Secondary | ICD-10-CM | POA: Insufficient documentation

## 2020-07-31 DIAGNOSIS — Z7984 Long term (current) use of oral hypoglycemic drugs: Secondary | ICD-10-CM | POA: Insufficient documentation

## 2020-07-31 NOTE — ED Provider Notes (Signed)
[facility] DEPT Provider Note   CSN: [id number] Arrival date & time: 07/31/20  9563     History Chief Complaint  Patient presents with  . Cough    [name] [name] is a 50 y.o. female.  Patient presents to ER requesting additional pain medications.  She states that she is tired of walking.  She was seen in the ER yesterday twice.  She was seen by psychiatry team and cleared for continued outpatient care.  Patient states she returned because she is tired of walking around outside.  Otherwise no thoughts of suicidal homicidal ideation.        Past Medical History:  Diagnosis Date  . Anxiety disorder   . Asthma   . Bipolar affective disorder ([place])   . Depression   . Diabetes mellitus   . Migraine   . Schizophrenia [facility])     Patient Active Problem List   Diagnosis Date Noted  . Acute pyelonephritis 07/17/2020  . Homelessness 06/12/2020  . Bipolar 1 disorder ([place]) 12/18/2018  . Suicide attempt ([place])   . Overdose, intentional self-harm, initial encounter ([place]) 12/13/2018  . Bipolar I disorder, single manic episode, severe, with psychosis ([place]) 06/09/2018  . Bipolar disorder ([place]) 06/09/2018  . DKA, type 2 ([place]) 01/06/2018  . Diabetic acidosis without coma ([place])   . Adjustment disorder with depressed mood 01/04/2018  . Oral thrush   . Acute lower UTI   . DKA, type 2, not at goal [facility]) 01/03/2018  . Thrush 01/03/2018  . DM (diabetes mellitus), type 2, uncontrolled ([place]) 06/07/2017  . Abscess of groin, right 06/07/2017  . Abscess 06/07/2017  . Bipolar disorder with moderate depression ([place]) 08/31/2015  . Tobacco use disorder 08/28/2015  . HTN (hypertension) 08/28/2015  . Diabetes ([place]) 08/28/2015  . Hyponatremia 08/28/2015  . T2DM (type 2 diabetes mellitus) ([place]) 10/26/2009  . OBESITY 10/26/2009  . Essential hypertension 10/26/2009  . DISTURBANCE OF SKIN SENSATION 10/26/2009  . Migraine headache 03/05/2007    Past Surgical History:   Procedure Laterality Date  . KNEE ARTHROSCOPY     x 2  . KNEE SURGERY Left    Incision made and knee cleaned out  . TUBAL LIGATION       OB History   No obstetric history on file.     Family History  Problem Relation Age of Onset  . Bipolar disorder Mother   . Hypertension Father   . Diabetes Father     Social History   Tobacco Use  . Smoking status: Current Every Day Smoker    Packs/day: 1.00    Years: 29.00    Pack years: 29.00    Types: Cigarettes  . Smokeless tobacco: Never Used  Vaping Use  . Vaping Use: Former  Substance Use Topics  . Alcohol use: Not Currently    Comment: occasionally  . Drug use: No    Home Medications Prior to Admission medications   Medication Sig Start Date End Date Taking? Authorizing Provider  APPLE CIDER VINEGAR PO Take 1 capsule by mouth 3 (three) times daily with meals.    [provider]  blood glucose meter kit and supplies Dispense based on patient and insurance preference. Use up to four times daily as directed. (FOR ICD-10 E10.9, E11.9). 01/06/18   Eugenie Filler, MD  Cholecalciferol (VITAMIN D3) 250 MCG (10000 UT) capsule Take 10,000 Units by mouth daily.    [provider]  Lactobacillus (PROBIOTIC ACIDOPHILUS) CAPS Take 1 capsule by  mouth daily.    [provider]  magnesium oxide (MAG-OX) 400 (241.3 Mg) MG tablet Take 1 tablet (400 mg total) by mouth 2 (two) times daily. 07/20/20   Pokhrel, Corrie Mckusick, MD  metFORMIN (GLUCOPHAGE) 1000 MG tablet Take 0.5 tablets (500 mg total) by mouth 2 (two) times daily. Patient taking differently: Take 1,000 mg by mouth 2 (two) times daily. 10/08/18   Andee Poles, MD  metoprolol succinate (TOPROL-XL) 25 MG 24 hr tablet Take 12.5 mg by mouth daily. 03/24/20   [provider]  Multiple Vitamin (MULTIVITAMIN WITH MINERALS) TABS tablet Take 1 tablet by mouth daily.    [provider]  risperiDONE (RISPERDAL) 0.5 MG tablet Take 1 tablet (0.5 mg  total) by mouth 2 (two) times daily. 07/20/20   Pokhrel, Corrie Mckusick, MD  sertraline (ZOLOFT) 100 MG tablet Take 1.5 tablets (150 mg total) by mouth daily. 07/20/20   Pokhrel, Corrie Mckusick, MD  traZODone (DESYREL) 50 MG tablet Take 1 tablet (50 mg total) by mouth at bedtime as needed for sleep. 12/16/18   Antonieta Pert, MD  albuterol (PROVENTIL HFA;VENTOLIN HFA) 108 (90 Base) MCG/ACT inhaler Inhale 2 puffs into the lungs every 6 (six) hours as needed for wheezing or shortness of breath. Patient not taking: Reported on 12/13/2018 01/06/18 06/04/20  Eugenie Filler, MD  benztropine (COGENTIN) 1 MG tablet Take 1 tablet (1 mg total) by mouth 2 (two) times daily. 12/23/18 06/04/20  Johnn Hai, MD  insulin aspart (NOVOLOG) 100 UNIT/ML injection Inject 0-9 Units into the skin 3 (three) times daily with meals. Patient not taking: Reported on 06/03/2020 12/16/18 06/04/20  Antonieta Pert, MD  mirtazapine (REMERON) 15 MG tablet Take 1 tablet (15 mg total) by mouth at bedtime. Patient not taking: Reported on 06/03/2020 12/22/18 06/04/20  Johnn Hai, MD    Allergies    Tramadol and Nicotine  Review of Systems   Review of Systems  Constitutional: Negative for fever.  Respiratory: Negative for cough.   Skin: Negative for rash.  Psychiatric/Behavioral: Negative for suicidal ideas.    Physical Exam Updated Vital Signs BP (!) 171/116 (BP Location: Left Arm)   Pulse 68   Temp 99 F (37.2 C) (Oral)   Resp 18   SpO2 97%   Physical Exam Constitutional:      General: She is not in acute distress.    Appearance: Normal appearance.  HENT:     Head: Normocephalic.     Nose: Nose normal.  Eyes:     Extraocular Movements: Extraocular movements intact.  Cardiovascular:     Rate and Rhythm: Normal rate.  Pulmonary:     Effort: Pulmonary effort is normal.  Musculoskeletal:        General: Normal range of motion.     Cervical back: Normal range of motion.  Neurological:     General: No focal deficit present.     Mental  Status: She is alert. Mental status is at baseline.     ED Results / Procedures / Treatments   Labs (all labs ordered are listed, but only abnormal results are displayed) Labs Reviewed - No data to display  EKG None  Radiology CT Head Wo Contrast  Result Date: 07/30/2020 CLINICAL DATA:  Dizziness. EXAM: CT HEAD WITHOUT CONTRAST TECHNIQUE: Contiguous axial images were obtained from the base of the skull through the vertex without intravenous contrast. COMPARISON:  June 04, 2020 FINDINGS: Brain: No evidence of acute hemorrhage, hydrocephalus, extra-axial collection or mass lesion/mass effect. A small, ill-defined area of  white matter low attenuation is seen within the basal ganglia on the right. This is not clearly identified on the prior study. There is no evidence of associated mass effect or midline shift. Vascular: No hyperdense vessel or unexpected calcification. Skull: Normal. Negative for fracture or focal lesion. Sinuses/Orbits: No acute finding. Other: None. IMPRESSION: Small area of white matter low attenuation within the right basal ganglia, not clearly identified on the prior study. While this may represent a small area of chronic microvascular ischemic disease, correlation with MRI is recommended. Electronically Signed   By: Virgina Norfolk M.D.   On: 07/30/2020 01:50    Procedures Procedures   Medications Ordered in ED Medications - No data to display  ED Course  I have reviewed the triage vital signs and the nursing notes.  Pertinent labs & imaging results that were available during my care of the patient were reviewed by me and considered in my medical decision making (see chart for details).    MDM Rules/Calculators/A&P                          Patient mildly hypertensive otherwise normal vital signs here.  She was evaluated twice yesterday.  I see no medical emergency in this patient's visit here today.  She is requesting additional medications for her chronic  pain.  Advised her to follow-up with her pain clinic.  Advised return if she has any other emergencies or any other concerns otherwise discharged home in stable condition.   Final Clinical Impression(s) / ED Diagnoses Final diagnoses:  Other chronic pain    Rx / DC Orders ED Discharge Orders    None       Luna Fuse, MD 07/31/20 0930

## 2020-07-31 NOTE — ED Notes (Addendum)
Pt refused to leave after being seen by EDP. She refused to sign discharge papers. She is verbally cussing at all staff and calling the hospital staff names as well as negative statements about [place]. She refused to leave the room at discharge. Security aware and [employer] has been called due to pt stating she will not leave facility without police.  She continues to mock staff with negative comments.

## 2020-07-31 NOTE — ED Triage Notes (Addendum)
Pt arrived via walk in with multiple complaints. States cough, headache, left hand pain from accident that happened months ago. Seen yesterday for same.

## 2020-07-31 NOTE — ED Notes (Addendum)
Pt continues to stay in bathroom requesting sanitary napkins, rubbing ben gay on body. Pt leaving with [employer] aware charges against her for missing a court day.

## 2020-07-31 NOTE — ED Notes (Addendum)
[employer] arrived to escort pt out of facility. Officer Simms offered her a ride to where she would like to go, "to [employer] arguing with [employer] Pt aware she is now trespassing, "don't care, have some against me" Pt remains relaxed in recliner while talking. Pt now wants to be taken to her PCP, Officer Simms is checking to see if office is open today. Pt in bathroom, Officer Simms returned to department with additional information regarding pending charges on pt.

## 2020-08-07 ENCOUNTER — Emergency Department (HOSPITAL_COMMUNITY)
Admission: EM | Admit: 2020-08-07 | Discharge: 2020-08-07 | Disposition: A | Discharge status: Home / Self Care | Payer: Medicaid Other | Source: Home / Self Care | Attending: Emergency Medicine | Admitting: Emergency Medicine

## 2020-08-07 ENCOUNTER — Emergency Department (HOSPITAL_COMMUNITY)
Admission: EM | Admit: 2020-08-07 | Discharge: 2020-08-07 | Discharge status: Against Medical Advice | Payer: Medicaid Other | Attending: Emergency Medicine | Admitting: Emergency Medicine

## 2020-08-07 ENCOUNTER — Emergency Department (HOSPITAL_COMMUNITY): Payer: Medicaid Other

## 2020-08-07 ENCOUNTER — Other Ambulatory Visit: Payer: Self-pay

## 2020-08-07 DIAGNOSIS — R300 Dysuria: Secondary | ICD-10-CM | POA: Diagnosis present

## 2020-08-07 DIAGNOSIS — M79605 Pain in left leg: Secondary | ICD-10-CM | POA: Diagnosis not present

## 2020-08-07 DIAGNOSIS — E1110 Type 2 diabetes mellitus with ketoacidosis without coma: Secondary | ICD-10-CM | POA: Insufficient documentation

## 2020-08-07 DIAGNOSIS — R319 Hematuria, unspecified: Secondary | ICD-10-CM | POA: Insufficient documentation

## 2020-08-07 DIAGNOSIS — N390 Urinary tract infection, site not specified: Secondary | ICD-10-CM | POA: Insufficient documentation

## 2020-08-07 DIAGNOSIS — I10 Essential (primary) hypertension: Secondary | ICD-10-CM | POA: Insufficient documentation

## 2020-08-07 DIAGNOSIS — M79604 Pain in right leg: Secondary | ICD-10-CM | POA: Insufficient documentation

## 2020-08-07 DIAGNOSIS — Z5900 Homelessness unspecified: Secondary | ICD-10-CM | POA: Diagnosis not present

## 2020-08-07 DIAGNOSIS — F17210 Nicotine dependence, cigarettes, uncomplicated: Secondary | ICD-10-CM | POA: Insufficient documentation

## 2020-08-07 DIAGNOSIS — E119 Type 2 diabetes mellitus without complications: Secondary | ICD-10-CM | POA: Insufficient documentation

## 2020-08-07 DIAGNOSIS — J45909 Unspecified asthma, uncomplicated: Secondary | ICD-10-CM | POA: Insufficient documentation

## 2020-08-07 DIAGNOSIS — E1169 Type 2 diabetes mellitus with other specified complication: Secondary | ICD-10-CM | POA: Insufficient documentation

## 2020-08-07 DIAGNOSIS — N3001 Acute cystitis with hematuria: Secondary | ICD-10-CM | POA: Insufficient documentation

## 2020-08-07 DIAGNOSIS — E669 Obesity, unspecified: Secondary | ICD-10-CM | POA: Insufficient documentation

## 2020-08-07 DIAGNOSIS — Z7984 Long term (current) use of oral hypoglycemic drugs: Secondary | ICD-10-CM | POA: Diagnosis not present

## 2020-08-07 DIAGNOSIS — R52 Pain, unspecified: Secondary | ICD-10-CM

## 2020-08-07 DIAGNOSIS — Z794 Long term (current) use of insulin: Secondary | ICD-10-CM | POA: Insufficient documentation

## 2020-08-07 DIAGNOSIS — Z79899 Other long term (current) drug therapy: Secondary | ICD-10-CM | POA: Diagnosis not present

## 2020-08-07 DIAGNOSIS — G8929 Other chronic pain: Secondary | ICD-10-CM

## 2020-08-07 LAB — WET PREP, GENITAL
Clue Cells Wet Prep HPF POC: NONE SEEN
Sperm: NONE SEEN
Trich, Wet Prep: NONE SEEN
Yeast Wet Prep HPF POC: NONE SEEN

## 2020-08-07 LAB — COMPREHENSIVE METABOLIC PANEL
ALT: 14 U/L (ref 0–44)
AST: 14 U/L — ABNORMAL LOW (ref 15–41)
Albumin: 3.7 g/dL (ref 3.5–5.0)
Alkaline Phosphatase: 93 U/L (ref 38–126)
Anion gap: 12 (ref 5–15)
BUN: 8 mg/dL (ref 6–20)
CO2: 27 mmol/L (ref 22–32)
Calcium: 9.4 mg/dL (ref 8.9–10.3)
Chloride: 102 mmol/L (ref 98–111)
Creatinine, Ser: 0.74 mg/dL (ref 0.44–1.00)
GFR, Estimated: >60 mL/min (ref >60)
Glucose, Bld: 289 mg/dL — ABNORMAL HIGH (ref 70–99)
Potassium: 3.9 mmol/L (ref 3.5–5.1)
Sodium: 141 mmol/L (ref 135–145)
Total Bilirubin: 0.8 mg/dL (ref 0.3–1.2)
Total Protein: 6.9 g/dL (ref 6.5–8.1)

## 2020-08-07 LAB — CBC WITH DIFFERENTIAL/PLATELET
Abs Immature Granulocytes: 0.03 10*3/uL (ref 0.00–0.07)
Basophils Absolute: 0.1 10*3/uL (ref 0.0–0.1)
Basophils Relative: 1 %
Eosinophils Absolute: 0.2 10*3/uL (ref 0.0–0.5)
Eosinophils Relative: 2 %
HCT: 41.9 % (ref 36.0–46.0)
Hemoglobin: 13.5 g/dL (ref 12.0–15.0)
Immature Granulocytes: 0 %
Lymphocytes Relative: 12 %
Lymphs Abs: 1.6 10*3/uL (ref 0.7–4.0)
MCH: 28.8 pg (ref 26.0–34.0)
MCHC: 32.2 g/dL (ref 30.0–36.0)
MCV: 89.3 fL (ref 80.0–100.0)
Monocytes Absolute: 0.6 10*3/uL (ref 0.1–1.0)
Monocytes Relative: 5 %
Neutro Abs: 10.5 10*3/uL — ABNORMAL HIGH (ref 1.7–7.7)
Neutrophils Relative %: 80 %
Platelets: 302 10*3/uL (ref 150–400)
RBC: 4.69 MIL/uL (ref 3.87–5.11)
RDW: 15.1 % (ref 11.5–15.5)
WBC: 13 10*3/uL — ABNORMAL HIGH (ref 4.0–10.5)
nRBC: 0 % (ref 0.0–0.2)

## 2020-08-07 LAB — URINALYSIS, ROUTINE W REFLEX MICROSCOPIC
Bilirubin Urine: NEGATIVE
Glucose, UA: >=500 mg/dL — AB
Ketones, ur: NEGATIVE mg/dL
Nitrite: POSITIVE — AB
Protein, ur: 100 mg/dL — AB
RBC / HPF: >50 RBC/hpf — ABNORMAL HIGH (ref 0–5)
Specific Gravity, Urine: 1.007 (ref 1.005–1.030)
WBC, UA: >50 WBC/hpf — ABNORMAL HIGH (ref 0–5)
pH: 6 (ref 5.0–8.0)

## 2020-08-07 LAB — RAPID URINE DRUG SCREEN, HOSP PERFORMED
Amphetamines: NOT DETECTED
Barbiturates: NOT DETECTED
Benzodiazepines: NOT DETECTED
Cocaine: NOT DETECTED
Opiates: NOT DETECTED
Tetrahydrocannabinol: NOT DETECTED

## 2020-08-07 LAB — RPR: RPR Ser Ql: NONREACTIVE

## 2020-08-07 LAB — LACTIC ACID, PLASMA
Lactic Acid, Venous: 1.1 mmol/L (ref 0.5–1.9)
Lactic Acid, Venous: 1.1 mmol/L (ref 0.5–1.9)

## 2020-08-07 LAB — PREGNANCY, URINE: Preg Test, Ur: NEGATIVE

## 2020-08-07 LAB — HIV ANTIBODY (ROUTINE TESTING W REFLEX): HIV Screen 4th Generation wRfx: NONREACTIVE

## 2020-08-07 MED ORDER — SODIUM CHLORIDE 0.9 % IV BOLUS
1000.0000 mL | Freq: Once | INTRAVENOUS | Status: AC
  Administered 2020-08-07: 1000 mL via INTRAVENOUS

## 2020-08-07 MED ORDER — FOSFOMYCIN TROMETHAMINE 3 G PO PACK
3.0000 g | PACK | Freq: Once | ORAL | Status: AC
  Administered 2020-08-07: 3 g via ORAL
  Filled 2020-08-07: qty 3

## 2020-08-07 MED ORDER — SODIUM CHLORIDE 0.9 % IV SOLN
1.0000 g | Freq: Once | INTRAVENOUS | Status: AC
  Administered 2020-08-07: 1 g via INTRAVENOUS
  Filled 2020-08-07: qty 10

## 2020-08-07 MED ORDER — ACETAMINOPHEN 325 MG PO TABS
650.0000 mg | ORAL_TABLET | Freq: Once | ORAL | Status: AC
  Administered 2020-08-07: 650 mg via ORAL
  Filled 2020-08-07: qty 2

## 2020-08-07 NOTE — ED Notes (Signed)
Pt back to room from [place], informs nursing staff she is going to the bathroom, pt ambulatory.

## 2020-08-07 NOTE — ED Notes (Signed)
Nursing staff to bathroom to check on pt, pt is not in bathroom, security and [employer] made aware and looking for pt. This RN searched department for pt, unable to find pt, This RN notified ED PA Uvaldo Rising that pt has [place]. Pt has an IV in her hand. This RN notified charge nurse Martie Lee, Noted that pt is homeless and phone number on file going to voicemail.

## 2020-08-07 NOTE — ED Provider Notes (Signed)
[facility] EMERGENCY DEPARTMENT Provider Note   CSN: [id number] Arrival date & time: 08/07/20  0647     History Chief Complaint  Patient presents with  . Dysuria    [name] [name] is a 50 y.o. female.  HPI   Patient with significant medical history of asthma, bipolar, type 2 diabetes, schizophrenia, migraines, anxiety, homelessness presents to the emergency department chief complaint of urinary symptoms.  Patient is a poor historian could not obtain very much information.  She endorses that she is having a burning sensation when she urinates, increased frequency, and hematuria, she denies vaginal pain or discharge, no vaginal bleeding present.  She has general abdominal pain, not associated with nausea or vomiting, no flank pain.  Patient has no significant abdominal history, did have a tubal ligation performed, no history of kidney stones, gastric ulcers, pancreatitis, diverticulitis.  She is unclear when pain started, she states she has had this pain since October and the pain is intermittent.  After reviewing patient's chart she was recently discharged on 02/08 for pyelonephritis, she was given a dose of Rocephin and switched to Keflex.  She was discharged and told to follow-up.  Unfortunate patient has not been taking her antibiotics as she states she lost it, she states she thinks she took 3 days of it.  She also endorses that she was sexually active a few days prior, denies rashes or lesions, does not remember the last time she had an STD check.  Patient denies alleviating factors.  Patient denies headaches, fevers, chills, shortness of breath, chest pain, diarrhea, constipation, worsening pedal edema.  Past Medical History:  Diagnosis Date  . Anxiety disorder   . Asthma   . Bipolar affective disorder (HCC)   . Depression   . Diabetes mellitus   . Migraine   . Schizophrenia (HCC)     Patient Active Problem List   Diagnosis Date Noted  . Acute pyelonephritis  07/17/2020  . Homelessness 06/12/2020  . Bipolar 1 disorder (HCC) 12/18/2018  . Suicide attempt (HCC)   . Overdose, intentional self-harm, initial encounter (HCC) 12/13/2018  . Bipolar I disorder, single manic episode, severe, with psychosis (HCC) 06/09/2018  . Bipolar disorder (HCC) 06/09/2018  . DKA, type 2 (HCC) 01/06/2018  . Diabetic acidosis without coma (HCC)   . Adjustment disorder with depressed mood 01/04/2018  . Oral thrush   . Acute lower UTI   . DKA, type 2, not at goal (HCC) 01/03/2018  . Thrush 01/03/2018  . DM (diabetes mellitus), type 2, [name] 06/07/2017  . Abscess of groin, right 06/07/2017  . Abscess 06/07/2017  . Bipolar disorder with moderate depression (HCC) 08/31/2015  . Tobacco use disorder 08/28/2015  . HTN (hypertension) 08/28/2015  . Diabetes (HCC) 08/28/2015  . Hyponatremia 08/28/2015  . T2DM (type 2 diabetes mellitus) (HCC) 10/26/2009  . OBESITY 10/26/2009  . Essential hypertension 10/26/2009  . DISTURBANCE OF SKIN SENSATION 10/26/2009  . Migraine headache 03/05/2007    Past Surgical History:  Procedure Laterality Date  . KNEE ARTHROSCOPY     x 2  . KNEE SURGERY Left    Incision made and knee cleaned out  . TUBAL LIGATION       OB History   No obstetric history on file.     Family History  Problem Relation Age of Onset  . Bipolar disorder Mother   . Hypertension Father   . Diabetes Father     Social History   Tobacco Use  .   Smoking status: Current Every Day Smoker    Packs/day: 1.00    Years: 29.00    Pack years: 29.00    Types: Cigarettes  . Smokeless tobacco: Never Used  Vaping Use  . Vaping Use: Former  Substance Use Topics  . Alcohol use: Not Currently    Comment: occasionally  . Drug use: No    Home Medications Prior to Admission medications   Medication Sig Start Date End Date Taking? Authorizing Provider  APPLE CIDER VINEGAR PO Take 1 capsule by mouth 3 (three) times daily with meals.    [provider]  blood glucose meter kit and supplies Dispense based on patient and insurance preference. Use up to four times daily as directed. (FOR ICD-10 E10.9, E11.9). 01/06/18   Thompson, Daniel V, MD  Cholecalciferol (VITAMIN D3) 250 MCG (10000 UT) capsule Take 10,000 Units by mouth daily.    [provider]  Lactobacillus (PROBIOTIC ACIDOPHILUS) CAPS Take 1 capsule by mouth daily.    [provider]  magnesium oxide (MAG-OX) 400 (241.3 Mg) MG tablet Take 1 tablet (400 mg total) by mouth 2 (two) times daily. 07/20/20   Pokhrel, Laxman, MD  metFORMIN (GLUCOPHAGE) 1000 MG tablet Take 0.5 tablets (500 mg total) by mouth 2 (two) times daily. Patient taking differently: Take 1,000 mg by mouth 2 (two) times daily. 10/08/18   Westphal, Nathaniel, MD  metoprolol succinate (TOPROL-XL) 25 MG 24 hr tablet Take 12.5 mg by mouth daily. 03/24/20   [provider]  Multiple Vitamin (MULTIVITAMIN WITH MINERALS) TABS tablet Take 1 tablet by mouth daily.    [provider]  risperiDONE (RISPERDAL) 0.5 MG tablet Take 1 tablet (0.5 mg total) by mouth 2 (two) times daily. 07/20/20   Pokhrel, Laxman, MD  sertraline (ZOLOFT) 100 MG tablet Take 1.5 tablets (150 mg total) by mouth daily. 07/20/20   Pokhrel, Laxman, MD  traZODone (DESYREL) 50 MG tablet Take 1 tablet (50 mg total) by mouth at bedtime as needed for sleep. 12/16/18   Kc, Ramesh, MD  albuterol (PROVENTIL HFA;VENTOLIN HFA) 108 (90 Base) MCG/ACT inhaler Inhale 2 puffs into the lungs every 6 (six) hours as needed for wheezing or shortness of breath. Patient not taking: Reported on 12/13/2018 01/06/18 06/04/20  Thompson, Daniel V, MD  benztropine (COGENTIN) 1 MG tablet Take 1 tablet (1 mg total) by mouth 2 (two) times daily. 12/23/18 06/04/20  Farah, Brian, MD  insulin aspart (NOVOLOG) 100 UNIT/ML injection Inject 0-9 Units into the skin 3 (three) times daily with meals. Patient not taking: Reported on 06/03/2020 12/16/18 06/04/20  Kc,  Ramesh, MD  mirtazapine (REMERON) 15 MG tablet Take 1 tablet (15 mg total) by mouth at bedtime. Patient not taking: Reported on 06/03/2020 12/22/18 06/04/20  Farah, Brian, MD    Allergies    Tramadol and Nicotine  Review of Systems   Review of Systems  Constitutional: Negative for chills and fever.  HENT: Negative for congestion and sore throat.   Respiratory: Negative for shortness of breath.   Cardiovascular: Negative for chest pain.  Gastrointestinal: Positive for abdominal pain. Negative for constipation, diarrhea, nausea and vomiting.  Genitourinary: Positive for dysuria, frequency, hematuria and urgency. Negative for enuresis, pelvic pain, vaginal bleeding, vaginal discharge and vaginal pain.  Musculoskeletal: Negative for back pain.  Skin: Negative for rash.  Neurological: Negative for dizziness and headaches.  Hematological: Does not bruise/bleed easily.    Physical Exam Updated Vital Signs BP 118/64   Pulse (!) 106   Temp (!)   97.3 F (36.3 C) (Oral)   Resp 20   Ht 5' 5" (1.651 m)   Wt 81.2 kg   SpO2 93%   BMI 29.79 kg/m   Physical Exam Vitals and nursing note reviewed. Exam conducted with a chaperone present.  Constitutional:      General: She is not in acute distress.    Appearance: Normal appearance. She is not ill-appearing.     Comments: On my exam patient is disheveled, appears to be uncomfortable.  HENT:     Head: Normocephalic and atraumatic.     Nose: No congestion or rhinorrhea.     Mouth/Throat:     Mouth: Mucous membranes are dry.     Pharynx: Oropharynx is clear. No oropharyngeal exudate or posterior oropharyngeal erythema.  Eyes:     Conjunctiva/sclera: Conjunctivae normal.  Cardiovascular:     Rate and Rhythm: Regular rhythm. Tachycardia present.     Pulses: Normal pulses.     Heart sounds: No murmur heard. No friction rub. No gallop.   Pulmonary:     Effort: No respiratory distress.     Breath sounds: No stridor. No wheezing, rhonchi or  rales.  Abdominal:     General: There is no distension.     Palpations: Abdomen is soft.     Tenderness: There is abdominal tenderness. There is no right CVA tenderness, left CVA tenderness or guarding.     Comments: Patient's abdomen was visualized, it was nondistended, normoactive bowel sounds, dull to percussion.  She had nonspecific abdominal tenderness, negative Murphy sign, negative McBurney point, peritoneal or rebound tenderness.  She had no CVA tenderness on my exam.  Genitourinary:    Comments: Pelvic exam performed, exterior genitalia was examined there was no lesions, rashes or discharge noted.  Vaginal canal was patent, pink, no lesions or trauma noted.  Cervix was visualized there was no lesions, discharge, or other abnormalities noted.  Patient was uncomfortable during the exam, she had noted left adnexal tenderness. Musculoskeletal:     Right lower leg: No edema.     Left lower leg: No edema.  Skin:    General: Skin is warm and dry.     Comments: Patient is a noted healing wound on her right knee, no signs infection present.  There is no noted track marks, lacerations, abrasions, ecchymosis or other gross abnormalities noted on a limited skin exam.  Neurological:     Mental Status: She is alert.  Psychiatric:        Mood and Affect: Mood normal.     ED Results / Procedures / Treatments   Labs (all labs ordered are listed, but only abnormal results are displayed) Labs Reviewed  WET PREP, GENITAL - Abnormal; Notable for the following components:      Result Value   WBC, Wet Prep HPF POC FEW (*)    All other components within normal limits  COMPREHENSIVE METABOLIC PANEL - Abnormal; Notable for the following components:   Glucose, Bld 289 (*)    AST 14 (*)    All other components within normal limits  CBC WITH DIFFERENTIAL/PLATELET - Abnormal; Notable for the following components:   WBC 13.0 (*)    Neutro Abs 10.5 (*)    All other components within normal limits   URINALYSIS, ROUTINE W REFLEX MICROSCOPIC - Abnormal; Notable for the following components:   APPearance CLOUDY (*)    Glucose, UA >=500 (*)    Hgb urine dipstick LARGE (*)    Protein, ur 100 (*)      Nitrite POSITIVE (*)    Leukocytes,Ua LARGE (*)    RBC / HPF >50 (*)    WBC, UA >50 (*)    Bacteria, UA FEW (*)    All other components within normal limits  URINE CULTURE  CULTURE, BLOOD (ROUTINE X 2)  CULTURE, BLOOD (ROUTINE X 2)  PREGNANCY, URINE  LACTIC ACID, PLASMA  LACTIC ACID, PLASMA  RPR  HIV ANTIBODY (ROUTINE TESTING W REFLEX)  RAPID URINE DRUG SCREEN, HOSP PERFORMED  GC/CHLAMYDIA PROBE AMP ([place]) NOT AT [facility]    EKG EKG Interpretation  Date/Time:  Monday August 07 2020 08:29:27 EST Ventricular Rate:  103 PR Interval:    QRS Duration: 87 QT Interval:  394 QTC Calculation: 516 R Axis:   63 Text Interpretation: Sinus tachycardia Right atrial enlargement ST elevation, consider inferior injury Prolonged QT interval Confirmed by Pickering, Nathan ([id number]) on 08/07/2020 8:31:46 AM   Radiology CT Renal Stone Study  Result Date: 08/07/2020 CLINICAL DATA:  50-year-old female with history of flank pain and painful urination. EXAM: CT ABDOMEN AND PELVIS WITHOUT CONTRAST TECHNIQUE: Multidetector CT imaging of the abdomen and pelvis was performed following the standard protocol without IV contrast. COMPARISON:  CT the abdomen 02/10/2004. FINDINGS: Lower chest: Aortic atherosclerosis. Hepatobiliary: No suspicious cystic or solid hepatic lesions are confidently identified on today's noncontrast CT examination. Unenhanced appearance of the gallbladder is normal. Pancreas: No definite pancreatic mass or peripancreatic fluid collections or inflammatory changes. Spleen: Unremarkable. Adrenals/Urinary Tract: There are no abnormal calcifications within the collecting system of either kidney, along the course of either ureter, or within the lumen of the urinary bladder. No  hydroureteronephrosis or perinephric stranding to suggest urinary tract obstruction at this time. In the interpolar region of the left kidney posteriorly there is a 1.5 cm low-attenuation lesion, incompletely characterized on today's non-contrast CT examination, but statistically likely to represent a cyst. Unenhanced appearance of the right kidney and bilateral adrenal glands is otherwise normal. Urinary bladder is nearly decompressed, but unremarkable in appearance. Stomach/Bowel: The unenhanced appearance of the stomach is normal. There is no pathologic dilatation of small bowel or colon. The appendix is not confidently identified and may be surgically absent. Regardless, there are no inflammatory changes noted adjacent to the cecum to suggest the presence of an acute appendicitis at this time. Vascular/Lymphatic: Aortic atherosclerosis. No lymphadenopathy noted in the abdomen. Reproductive: Unenhanced appearance of the uterus and ovaries is unremarkable. Other: No significant volume of ascites.  No pneumoperitoneum. Musculoskeletal: There are no aggressive appearing lytic or blastic lesions noted in the visualized portions of the skeleton. IMPRESSION: 1. No acute findings are noted in the abdomen or pelvis to account for the patient's symptoms. Specifically, no urinary tract calculi no findings of urinary tract obstruction are noted at this time. 2. 1.5 cm low-attenuation lesion in the interpolar region of the left kidney, incompletely characterized on today's non-contrast CT examination, but statistically likely to represent a cyst. 3. Aortic atherosclerosis. Electronically Signed   By: Daniel  Entrikin M.D.   On: 08/07/2020 10:16   US PELVIC COMPLETE W TRANSVAGINAL AND TORSION R/O  Result Date: 08/07/2020 CLINICAL DATA:  LEFT pelvic/adnexal pain, postmenopausal for 6-8 months EXAM: TRANSABDOMINAL AND TRANSVAGINAL ULTRASOUND OF PELVIS DOPPLER ULTRASOUND OF OVARIES TECHNIQUE: Both transabdominal and  transvaginal ultrasound examinations of the pelvis were performed. Transabdominal technique was performed for global imaging of the pelvis including uterus, ovaries, adnexal regions, and pelvic cul-de-sac. It was necessary to proceed with endovaginal exam following the transabdominal exam to   visualize the uterus, endometrium, and ovaries. Color and duplex Doppler ultrasound was utilized to evaluate blood flow to the ovaries. COMPARISON:  CT abdomen pelvis 08/07/2020 FINDINGS: Uterus Measurements: 7.7 x 3.7 x 3.9 cm = volume: 58 mL. Heterogeneous myometrium. Few clustered calcifications likely representing a small calcified intramural leiomyoma 9 mm diameter. No additional mass. Endometrium Thickness: 3 mm.  No endometrial fluid or focal abnormality Right ovary Measurements: 2.7 x 1.7 x 1.4 cm = volume: 3.3 mL. Not identified on transvaginal imaging due to bowel. Normal morphology without mass. Internal blood flow present on color Doppler imaging. Left ovary Not visualized, likely obscured by bowel Pulsed Doppler evaluation of both ovaries demonstrates normal low-resistance arterial and venous waveforms in the RIGHT ovary. LEFT ovary was not visualized for Doppler assessment. Other findings No free pelvic fluid.  No adnexal masses. IMPRESSION: Probable small calcified intramural leiomyoma 9 mm diameter. Nonvisualization of LEFT ovary on both transabdominal and endovaginal imaging; LEFT ovary appeared unremarkable on preceding CT. Remainder of exam normal. Electronically Signed   By: Lavonia Dana M.D.   On: 08/07/2020 12:28    Procedures Procedures   Medications Ordered in ED Medications  sodium chloride 0.9 % bolus 1,000 mL (0 mLs Intravenous Stopped 08/07/20 0946)  acetaminophen (TYLENOL) tablet 650 mg (650 mg Oral Given 08/07/20 0803)  cefTRIAXone (ROCEPHIN) 1 g in sodium chloride 0.9 % 100 mL IVPB (0 g Intravenous Stopped 08/07/20 1004)    ED Course  I have reviewed the triage vital signs and the nursing  notes.  Pertinent labs & imaging results that were available during my care of the patient were reviewed by me and considered in my medical decision making (see chart for details).    MDM Rules/Calculators/A&P                         Initial impression-patient presents with dysuria.  She is alert, appears to be uncomfortable, vital signs notable for tachycardia, concern for Pilo, UTI, PID.  Will obtain basic lab work, add on STD panel, perform pelvic exam provide patient with fluids and pain medications and reassess.  Work-up-CBC shows leukocytosis of 13.0, no signs of anemia, CMP shows slight hyperglycemia 289, UA shows positive nitrates, leukocytes, many red and white blood cells, few bacteria.  Wet prep positive for leukocytes, rapid drug screen negative.  1.1, RRR nonreactive, HIV nonreactive urine pregnancy negative CT renal negative for acute findings, does show 1.5 cm lesion in the left kidney most likely representing a cyst.  Transvaginal ultrasound negative for acute findings.  Reassessment patient was noted to be uncomfortable during pelvic exam, she had noted left adnexal pain, concern for possible ovarian torsion, will proceed with pelvic ultrasound for further evaluation.  Patient's UA notable for acute UTI with hematuria, will provide patient with dose of Rocephin.  There is a notable amount of blood within the urine will proceed with CT renal rule out kidney stone.  I was notified that patient eloped.  Cannot find patient after attempt to call her within the ED.  Rule out-I have low suspicion for ectopic pregnancy as urine pregnancy is negative.  low suspicion for pyelonephritis or kidney stones as patient has no CVA tenderness, imaging is negative for those findings.  Low suspicion for ovarian torsion as imaging is negative for acute findings.  Low suspicion appendicitis as patient had no right lower quadrant pain, patient tolerating p.o., history is atypical.  Low suspicion for  bowel obstruction as abdomen  nondistended, dull to percussion, patient is passing flatus at difficulty.  I have low suspicion for PID as patient denies pelvic pain, vaginal discharge or vaginal bleeding.  But cannot fully exclude as patient is sexually active with multiple partners.  This could explain her adnexal pain on my exam.  Was going to provide patient with dose of azithromycin  to cover for gonorrhea but she eloped.  I suspect patient suffering from uncomplicated acute UTI with hematuria.  Was going to treat with 1 dose of for some fosfomycin but patient eloped before I could treat her.  If patient comes back we are more than happy to treat the patient.   Final Clinical Impression(s) / ED Diagnoses Final diagnoses:  Lower urinary tract infectious disease    Rx / DC Orders ED Discharge Orders    None       ,  J, PA-C 08/07/20 1315    Pickering, Nathan, MD 08/07/20 1540  

## 2020-08-07 NOTE — ED Provider Notes (Signed)
Emergency Department Provider Note   I have reviewed the triage vital signs and the nursing notes.   HISTORY  Chief Complaint Homeless   HPI ROISE EMERT is a 50 y.o. female with past medical history reviewed below presents to the emergency department after evaluation earlier today.  Patient was seen initially with urinary tract infection symptoms.  She had an admission earlier this month with E. coli UTI.  She denies fevers or chills.  She denies back or flank pain.  She notes some burning with urination along with frequency.  During her ED work-up earlier today she had a complete work-up including UA with culture, blood culture, lactate, pelvic exam, CT renal, pelvic ultrasound.  The plan was to discharge the patient home after a dose of fosfomycin but patient tells me she left to smoke a cigarette before that was given.  She is homeless and notes that her primary complaint today is leg pain which is chronic for her.  She is followed by [facility] where her medication is provided.  Denies any new injury to the leg.  No development of new symptoms since leaving the ED earlier today.   Past Medical History:  Diagnosis Date  . Anxiety disorder   . Asthma   . Bipolar affective disorder (HCC)   . Depression   . Diabetes mellitus   . Migraine   . Schizophrenia [facility])     Patient Active Problem List   Diagnosis Date Noted  . Acute pyelonephritis 07/17/2020  . Homelessness 06/12/2020  . Bipolar 1 disorder (HCC) 12/18/2018  . Suicide attempt (HCC)   . Overdose, intentional self-harm, initial encounter (HCC) 12/13/2018  . Bipolar I disorder, single manic episode, severe, with psychosis (HCC) 06/09/2018  . Bipolar disorder (HCC) 06/09/2018  . DKA, type 2 (HCC) 01/06/2018  . Diabetic acidosis without coma (HCC)   . Adjustment disorder with depressed mood 01/04/2018  . Oral thrush   . Acute lower UTI   . DKA, type 2, not at goal [facility]) 01/03/2018  . Thrush 01/03/2018   . DM (diabetes mellitus), type 2, [name] 06/07/2017  . Abscess of groin, right 06/07/2017  . Abscess 06/07/2017  . Bipolar disorder with moderate depression (HCC) 08/31/2015  . Tobacco use disorder 08/28/2015  . HTN (hypertension) 08/28/2015  . Diabetes (HCC) 08/28/2015  . Hyponatremia 08/28/2015  . T2DM (type 2 diabetes mellitus) (HCC) 10/26/2009  . OBESITY 10/26/2009  . Essential hypertension 10/26/2009  . DISTURBANCE OF SKIN SENSATION 10/26/2009  . Migraine headache 03/05/2007    Past Surgical History:  Procedure Laterality Date  . KNEE ARTHROSCOPY     x 2  . KNEE SURGERY Left    Incision made and knee cleaned out  . TUBAL LIGATION      Allergies Tramadol, Wellbutrin [bupropion], and Nicotine  Family History  Problem Relation Age of Onset  . Bipolar disorder Mother   . Hypertension Father   . Diabetes Father     Social History Social History   Tobacco Use  . Smoking status: Current Every Day Smoker    Packs/day: 1.00    Years: 29.00    Pack years: 29.00    Types: Cigarettes  . Smokeless tobacco: Never Used  Vaping Use  . Vaping Use: Former  Substance Use Topics  . Alcohol use: Not Currently    Comment: occasionally  . Drug use: No    Review of Systems  Constitutional: No fever/chills Eyes: No visual changes. ENT: No sore throat.  Cardiovascular: Denies chest pain. Respiratory: Denies shortness of breath. Gastrointestinal: No abdominal pain.  No nausea, no vomiting.  No diarrhea.  No constipation. Genitourinary: Positive dysuria, hesitancy, and urgency.  Musculoskeletal: Negative for back pain. Positive chronic leg pain.  Skin: Negative for rash. Neurological: Negative for headaches, focal weakness or numbness.  10-point ROS otherwise negative.  ____________________________________________   PHYSICAL EXAM:  VITAL SIGNS: ED Triage Vitals  Enc Vitals Group     BP 08/07/20 1556 114/87     Pulse Rate 08/07/20 1556 (!) 112      Resp 08/07/20 1556 17     Temp 08/07/20 1556 98.5 F (36.9 C)     Temp Source 08/07/20 1556 Oral     SpO2 08/07/20 1556 98 %     Weight 08/07/20 1556 200 lb (90.7 kg)     Height 08/07/20 1556 5\' 5"  (1.651 m)    Constitutional: Alert and oriented. Well appearing and in no acute distress. Eyes: Conjunctivae are normal.  Head: Atraumatic. Nose: No congestion/rhinnorhea. Mouth/Throat: Mucous membranes are moist.  Neck: No stridor.  Cardiovascular: Good peripheral circulation.  Respiratory: Normal respiratory effort.  Gastrointestinal: No distention.  Musculoskeletal: No gross deformities of extremities. Neurologic:  Normal speech and language.  Skin:  Skin is warm, dry and intact. No rash noted.  ____________________________________________  RADIOLOGY  CT Renal Stone Study  Result Date: 08/07/2020 CLINICAL DATA:  50 year old female with history of flank pain and painful urination. EXAM: CT ABDOMEN AND PELVIS WITHOUT CONTRAST TECHNIQUE: Multidetector CT imaging of the abdomen and pelvis was performed following the standard protocol without IV contrast. COMPARISON:  CT the abdomen 02/10/2004. FINDINGS: Lower chest: Aortic atherosclerosis. Hepatobiliary: No suspicious cystic or solid hepatic lesions are confidently identified on today's noncontrast CT examination. Unenhanced appearance of the gallbladder is normal. Pancreas: No definite pancreatic mass or peripancreatic fluid collections or inflammatory changes. Spleen: Unremarkable. Adrenals/Urinary Tract: There are no abnormal calcifications within the collecting system of either kidney, along the course of either ureter, or within the lumen of the urinary bladder. No hydroureteronephrosis or perinephric stranding to suggest urinary tract obstruction at this time. In the interpolar region of the left kidney posteriorly there is a 1.5 cm low-attenuation lesion, incompletely characterized on today's non-contrast CT examination, but statistically  likely to represent a cyst. Unenhanced appearance of the right kidney and bilateral adrenal glands is otherwise normal. Urinary bladder is nearly decompressed, but unremarkable in appearance. Stomach/Bowel: The unenhanced appearance of the stomach is normal. There is no pathologic dilatation of small bowel or colon. The appendix is not confidently identified and may be surgically absent. Regardless, there are no inflammatory changes noted adjacent to the cecum to suggest the presence of an acute appendicitis at this time. Vascular/Lymphatic: Aortic atherosclerosis. No lymphadenopathy noted in the abdomen. Reproductive: Unenhanced appearance of the uterus and ovaries is unremarkable. Other: No significant volume of ascites.  No pneumoperitoneum. Musculoskeletal: There are no aggressive appearing lytic or blastic lesions noted in the visualized portions of the skeleton. IMPRESSION: 1. No acute findings are noted in the abdomen or pelvis to account for the patient's symptoms. Specifically, no urinary tract calculi no findings of urinary tract obstruction are noted at this time. 2. 1.5 cm low-attenuation lesion in the interpolar region of the left kidney, incompletely characterized on today's non-contrast CT examination, but statistically likely to represent a cyst. 3. Aortic atherosclerosis. Electronically Signed   By: 04/11/2004 M.D.   On: 08/07/2020 10:16   08/09/2020 PELVIC COMPLETE W TRANSVAGINAL  AND TORSION R/O  Result Date: 08/07/2020 CLINICAL DATA:  LEFT pelvic/adnexal pain, postmenopausal for 6-8 months EXAM: TRANSABDOMINAL AND TRANSVAGINAL ULTRASOUND OF PELVIS DOPPLER ULTRASOUND OF OVARIES TECHNIQUE: Both transabdominal and transvaginal ultrasound examinations of the pelvis were performed. Transabdominal technique was performed for global imaging of the pelvis including uterus, ovaries, adnexal regions, and pelvic cul-de-sac. It was necessary to proceed with endovaginal exam following the transabdominal exam  to visualize the uterus, endometrium, and ovaries. Color and duplex Doppler ultrasound was utilized to evaluate blood flow to the ovaries. COMPARISON:  CT abdomen pelvis 08/07/2020 FINDINGS: Uterus Measurements: 7.7 x 3.7 x 3.9 cm = volume: 58 mL. Heterogeneous myometrium. Few clustered calcifications likely representing a small calcified intramural leiomyoma 9 mm diameter. No additional mass. Endometrium Thickness: 3 mm.  No endometrial fluid or focal abnormality Right ovary Measurements: 2.7 x 1.7 x 1.4 cm = volume: 3.3 mL. Not identified on transvaginal imaging due to bowel. Normal morphology without mass. Internal blood flow present on color Doppler imaging. Left ovary Not visualized, likely obscured by bowel Pulsed Doppler evaluation of both ovaries demonstrates normal low-resistance arterial and venous waveforms in the RIGHT ovary. LEFT ovary was not visualized for Doppler assessment. Other findings No free pelvic fluid.  No adnexal masses. IMPRESSION: Probable small calcified intramural leiomyoma 9 mm diameter. Nonvisualization of LEFT ovary on both transabdominal and endovaginal imaging; LEFT ovary appeared unremarkable on preceding CT. Remainder of exam normal. Electronically Signed   By: Ulyses Southward M.D.   On: 08/07/2020 12:28    ____________________________________________   PROCEDURES  Procedure(s) performed:   Procedures  None  ____________________________________________   INITIAL IMPRESSION / ASSESSMENT AND PLAN / ED COURSE  Pertinent labs & imaging results that were available during my care of the patient were reviewed by me and considered in my medical decision making (see chart for details).   Patient presents to the emergency department with complaint of chronic leg pain.  Had work-up earlier today with findings suggestive of uncomplicated urinary tract infection.  No imaging or clinical findings to suspect pyelonephritis.  Plan was to give patient a single dose of fosfomycin  but she eloped prior to receiving this.  I have ordered that here.  Patient is homeless and I offered a list of area resources but she refused. Plan for discharge at this time after abx.    ____________________________________________  FINAL CLINICAL IMPRESSION(S) / ED DIAGNOSES  Final diagnoses:  Acute cystitis with hematuria  Chronic pain of both lower extremities    MEDICATIONS GIVEN DURING THIS VISIT:  Medications  fosfomycin (MONUROL) packet 3 g (has no administration in time range)    Note:  This document was prepared using Dragon voice recognition software and may include unintentional dictation errors.  Alona Bene, MD, [facility]    Raney Koeppen, Arlyss Repress, MD 08/07/20 2008

## 2020-08-07 NOTE — ED Notes (Signed)
Pt transported to [facility]

## 2020-08-07 NOTE — ED Triage Notes (Signed)
Pt reports she's been kicked out of multiple facilities. Pt is homeless. Seen here earlier given scripts for UTI  but unable to get prescriptions filled due to homelessness.

## 2020-08-07 NOTE — ED Triage Notes (Signed)
Pt arrived via ems due to possible uti. Pt was found outside a dollar store yelling that she has a bladder infection. Pt reports painful urination / frequency

## 2020-08-07 NOTE — Discharge Instructions (Signed)
Substance Abuse Treatment Programs ° °Intensive Outpatient Programs °[facility]     °[address]      °[place], [place]                   °[phone]      ° °[facility] °[address] °[place], [place] °[phone] ° °[facility]     °(Inpatient and outpatient)     °[address]           °[phone]   ° °[facility] °[phone] (Suboxone and Methadone) ° °[address]      °[place], [place] [address]      °[phone]      ° °[address] °[place], [place] °852-3033 ° °[facility] (Outpatient/Inpatient, Chemical)    °(insurance only) [phone]      °       °[facility] (Groups & Residential) °[place], [place] °[phone] ° °   °[facility]     °[address]     °[place], [place]      °[phone]      ° °[facility] (for caregivers and family) °[address] °[place], [place] °336-299-4655 ° ° ° ° ° °Residential Treatment Programs °[facility]      °[address], [place], [place] [address]  °[phone]      ° °[facility] °[address] [place], [place] [address] °[phone] ° °[facility]        °[phone]      ° °[facility] °[phone] ° °[facility]             °[address]                                         °[place], [place]                                                °[phone] or [phone]                              ° °[facility] [place] °[address] °[place], [address] °[phone] ° °[facility]    °[address]      °[place], [place]     °[phone]      ° °[facility] °[address] °[place], [place] °[phone] ° °[facility]   °[address]     °[place], [place] [address]     °[phone]      °Admissions: 8am-3pm M-F ° °[facility] °[address] °[place],  [place] °[phone] ° °[facility]: Residential Program (90 Days)   °[place],       °336-725-8389 or [phone]    ° °[facility] °[place],  °(Walk in Hours over the weekend or by referral) ° °[facility] °[address], [place],  [address] °[phone] ° °Crisis Mobile: [facility]:  1-877-626-1772 (for crisis response 24 hours a day) °[facility] Hotline:      1-800-256-2452 °Outpatient Psychiatry and Counseling ° °[facility]: Mobile Crisis   Management 24 hours:  1-877-626-1772 ° °[facility] sliding scale fee and walk in schedule: M-F 8am-12pm/1pm-3pm °[address]  °[place], [place] [address] °[phone] ° °[facility] °[address] °[place], [place] [address] °[phone] ° °[facility] ([facility] known [facility])- new patient walk-in appointments available Monday - Friday 8am -3pm.          °[address] °[place], [place] [address] °336-676-6840 or crisis line- 336-676-6905 ° °[facility] Intensive Outpatient Therapy Program °[address] °[place], [place] [address] °[phone] ° °[facility]                  °Crisis Services      °[phone]      °[address]     °[place], [place] [address]                ° °[facility]   °[facility] °[phone] °[address] °[place], [place] [address] ° ° °[facility]          °[address]  °[place], [place] [address]       °[phone] ° °[facility] °[address] °[place], [place] [address] °336-292-1510 ° °[facility]    °[address]    °[place], [place] [address]     °[phone]      ° °Parish McKinney, MD     °[address]     °[place] [address]     °[phone]     °  °[facility] °[address] °[place] [address] ° °[facility]     °[address]     °[place], [place]      °[phone]      ° °[facility] °Shamsher Ahluwalia, MD °[address] °[place], [place] [address] °[phone] ° °[facility]     °[address]     °[place], [place] [address]     °336-274-1237      ° °[facility] °[address] °[place], [place] [address] °336-854-4450 °Resources for Temporary Residential Assistance/Crisis Centers ° °DAY CENTERS °[facility] °M-F 8am-3pm   °[address] [place], [place] [address]   336-332-0824 °Services include: laundry, barbering, support groups, case management, phone  & computer access, showers, AA/NA mtgs, mental health/substance abuse nurse, job skills class, disability information, VA assistance, spiritual classes, etc.  ° °HOMELESS SHELTERS ° °[facility]     °[facility]   °[address], [place]     °[phone]       °       °[facility] (women and children)       °[address] °[place], [place] [address] °336-275-0820 °[email or web address] for application and process °Application Required ° °[facility]   °[address]    °[place] [address]     °[phone]       °             °[facility] °[address] °[place], [place] [address] °[phone] °336-235-0363(schedule application appt.) °Application Required ° °[facility] (women only)    °[address]     °[place],  [address]     °336-884-1039      °  Intake starts 6pm daily °Need valid ID, SSC, & Police report °[facility] [place] °[address] °[place], [place] °336-881-5420 °Application Required ° °[facility] (men only)     °[address]      °[place], [place]     °[phone]      ° °[facility] °(Pregnant women only) °[address] °[place], [place] °336-275-0206 ° °[facility]      °[address]      °[place], [place] [address]     °[phone]      °       °[facility] °[address] °[place], [place] °336-723-1848 °90 day commitment/SA/Application process ° °[facility](men only)     °[address]     °[place], [place]     °[phone]       °Check-in at 7pm     °       °[facility] °[address] °[place], [place] [address] °336-248-6684 °Men/Women/Women and Children must be there by 7 pm ° °[facility] °[place], [place] °336-722-8721                ° °

## 2020-08-07 NOTE — ED Notes (Signed)
Pt transported to CT ?

## 2020-08-07 NOTE — ED Notes (Signed)
PT IS BANNED FROM [facility] UNLESS IT IS A MEDICAL EMERGENCY PER [employer] AND SECURITY.

## 2020-08-07 NOTE — ED Notes (Signed)
Security found patient outdoors on property, brought pt back to ED, when IV was able to be removed. Ambulatory off unit with security.

## 2020-08-07 NOTE — ED Notes (Signed)
Went to update vitalsigns. Patient had left ama .with iv still in hand    nurse and are looking for her.gpd and securtiy are looking for her as well

## 2020-08-07 NOTE — ED Notes (Signed)
Pt verbally abusive towards nursing staff. Calling this nurse "bitch" refusing to keep herself on monitoring equipment, refusing to stay in her room,  Refusing to allow phlebotomy to obtain ordered blood cultures. This nurse notified EDP of pt behaviors. Will continue to monitor.

## 2020-08-08 LAB — GC/CHLAMYDIA PROBE AMP (~~LOC~~) NOT AT ARMC
Chlamydia: NEGATIVE
Comment: NEGATIVE
Comment: NORMAL
Neisseria Gonorrhea: NEGATIVE

## 2020-08-08 LAB — URINE CULTURE

## 2020-08-12 LAB — CULTURE, BLOOD (ROUTINE X 2)
Culture: NO GROWTH
Culture: NO GROWTH
Special Requests: ADEQUATE
Special Requests: ADEQUATE

## 2020-08-22 ENCOUNTER — Encounter (HOSPITAL_COMMUNITY): Payer: Self-pay

## 2020-08-22 ENCOUNTER — Emergency Department (HOSPITAL_COMMUNITY)
Admission: EM | Admit: 2020-08-22 | Discharge: 2020-08-22 | Disposition: A | Discharge status: Home / Self Care | Payer: Medicaid Other | Attending: Emergency Medicine | Admitting: Emergency Medicine

## 2020-08-22 ENCOUNTER — Other Ambulatory Visit: Payer: Self-pay

## 2020-08-22 DIAGNOSIS — I10 Essential (primary) hypertension: Secondary | ICD-10-CM | POA: Diagnosis not present

## 2020-08-22 DIAGNOSIS — E1165 Type 2 diabetes mellitus with hyperglycemia: Secondary | ICD-10-CM | POA: Insufficient documentation

## 2020-08-22 DIAGNOSIS — Z79899 Other long term (current) drug therapy: Secondary | ICD-10-CM | POA: Diagnosis not present

## 2020-08-22 DIAGNOSIS — F17210 Nicotine dependence, cigarettes, uncomplicated: Secondary | ICD-10-CM | POA: Diagnosis not present

## 2020-08-22 DIAGNOSIS — Z7984 Long term (current) use of oral hypoglycemic drugs: Secondary | ICD-10-CM | POA: Diagnosis not present

## 2020-08-22 DIAGNOSIS — R300 Dysuria: Secondary | ICD-10-CM | POA: Insufficient documentation

## 2020-08-22 DIAGNOSIS — Z794 Long term (current) use of insulin: Secondary | ICD-10-CM | POA: Diagnosis not present

## 2020-08-22 DIAGNOSIS — J45909 Unspecified asthma, uncomplicated: Secondary | ICD-10-CM | POA: Diagnosis not present

## 2020-08-22 DIAGNOSIS — R739 Hyperglycemia, unspecified: Secondary | ICD-10-CM

## 2020-08-22 DIAGNOSIS — E1110 Type 2 diabetes mellitus with ketoacidosis without coma: Secondary | ICD-10-CM | POA: Insufficient documentation

## 2020-08-22 LAB — BASIC METABOLIC PANEL
Anion gap: 8 (ref 5–15)
BUN: 11 mg/dL (ref 6–20)
CO2: 28 mmol/L (ref 22–32)
Calcium: 8.9 mg/dL (ref 8.9–10.3)
Chloride: 103 mmol/L (ref 98–111)
Creatinine, Ser: 0.86 mg/dL (ref 0.44–1.00)
GFR, Estimated: >60 mL/min (ref >60)
Glucose, Bld: 259 mg/dL — ABNORMAL HIGH (ref 70–99)
Potassium: 3.7 mmol/L (ref 3.5–5.1)
Sodium: 139 mmol/L (ref 135–145)

## 2020-08-22 LAB — URINALYSIS, ROUTINE W REFLEX MICROSCOPIC
Bacteria, UA: NONE SEEN
Bilirubin Urine: NEGATIVE
Glucose, UA: >=500 mg/dL — AB
Hgb urine dipstick: NEGATIVE
Ketones, ur: NEGATIVE mg/dL
Nitrite: NEGATIVE
Protein, ur: NEGATIVE mg/dL
Specific Gravity, Urine: 1.022 (ref 1.005–1.030)
pH: 5 (ref 5.0–8.0)

## 2020-08-22 LAB — CBC
HCT: 40 % (ref 36.0–46.0)
Hemoglobin: 12.7 g/dL (ref 12.0–15.0)
MCH: 28.6 pg (ref 26.0–34.0)
MCHC: 31.8 g/dL (ref 30.0–36.0)
MCV: 90.1 fL (ref 80.0–100.0)
Platelets: 219 10*3/uL (ref 150–400)
RBC: 4.44 MIL/uL (ref 3.87–5.11)
RDW: 14.2 % (ref 11.5–15.5)
WBC: 7.5 10*3/uL (ref 4.0–10.5)
nRBC: 0 % (ref 0.0–0.2)

## 2020-08-22 LAB — CBG MONITORING, ED
Glucose-Capillary: 255 mg/dL — ABNORMAL HIGH (ref 70–99)
Glucose-Capillary: 198 mg/dL — ABNORMAL HIGH (ref 70–99)
Glucose-Capillary: 308 mg/dL — ABNORMAL HIGH (ref 70–99)

## 2020-08-22 LAB — I-STAT BETA HCG BLOOD, ED (MC, WL, AP ONLY): I-stat hCG, quantitative: <5 m[IU]/mL (ref <5)

## 2020-08-22 MED ORDER — INSULIN ASPART 100 UNIT/ML ~~LOC~~ SOLN
6.0000 [IU] | Freq: Once | SUBCUTANEOUS | Status: AC
  Administered 2020-08-22: 6 [IU] via INTRAVENOUS
  Filled 2020-08-22: qty 0.06

## 2020-08-22 MED ORDER — IBUPROFEN 200 MG PO TABS
600.0000 mg | ORAL_TABLET | Freq: Once | ORAL | Status: AC
  Administered 2020-08-22: 600 mg via ORAL
  Filled 2020-08-22: qty 3

## 2020-08-22 MED ORDER — SODIUM CHLORIDE 0.9 % IV BOLUS
1000.0000 mL | Freq: Once | INTRAVENOUS | Status: AC
  Administered 2020-08-22: 1000 mL via INTRAVENOUS

## 2020-08-22 MED ORDER — ACETAMINOPHEN 325 MG PO TABS
650.0000 mg | ORAL_TABLET | Freq: Once | ORAL | Status: AC
  Administered 2020-08-22: 650 mg via ORAL
  Filled 2020-08-22: qty 2

## 2020-08-22 NOTE — ED Provider Notes (Signed)
[place] DEPT Provider Note   CSN: [id number] Arrival date & time: 08/22/20  1853     History Chief Complaint  Patient presents with  . Hyperglycemia    [name] [name] is a 50 y.o. female.   Hyperglycemia Associated symptoms: dysuria   Associated symptoms: no abdominal pain, no chest pain, no confusion, no fever, no polyuria, no shortness of breath and no weakness    Patient is homeless.  Reportedly was brought in because her blood sugar was high.  States however EMS was called by someone that was not her.  States she is homeless and has been unable to take all her medicines with her.  Appears to have been seen at [facility] yesterday and it appears been discharged.  Patient states when EMS checked her sugar was 530 although it looks like it was 300 on the EMS note.  250 upon arrival.  Denies suicidal homicidal thoughts.  States she has had some dysuria.  States she has not been eating or drinking much.    Past Medical History:  Diagnosis Date  . Anxiety disorder   . Asthma   . Bipolar affective disorder ([employer])   . Depression   . Diabetes mellitus   . Migraine   . Schizophrenia [facility])     Patient Active Problem List   Diagnosis Date Noted  . Acute pyelonephritis 07/17/2020  . Homelessness 06/12/2020  . Bipolar 1 disorder ([place]) 12/18/2018  . Suicide attempt ([place])   . Overdose, intentional self-harm, initial encounter ([place]) 12/13/2018  . Bipolar I disorder, single manic episode, severe, with psychosis ([place]) 06/09/2018  . Bipolar disorder ([place]) 06/09/2018  . DKA, type 2 ([place]) 01/06/2018  . Diabetic acidosis without coma ([place])   . Adjustment disorder with depressed mood 01/04/2018  . Oral thrush   . Acute lower UTI   . DKA, type 2, not at goal [facility]) 01/03/2018  . Thrush 01/03/2018  . DM (diabetes mellitus), type 2, uncontrolled ([place]) 06/07/2017  . Abscess of groin, right 06/07/2017  . Abscess 06/07/2017  . Bipolar disorder  with moderate depression ([place]) 08/31/2015  . Tobacco use disorder 08/28/2015  . HTN (hypertension) 08/28/2015  . Diabetes ([place]) 08/28/2015  . Hyponatremia 08/28/2015  . T2DM (type 2 diabetes mellitus) ([place]) 10/26/2009  . OBESITY 10/26/2009  . Essential hypertension 10/26/2009  . DISTURBANCE OF SKIN SENSATION 10/26/2009  . Migraine headache 03/05/2007    Past Surgical History:  Procedure Laterality Date  . KNEE ARTHROSCOPY     x 2  . KNEE SURGERY Left    Incision made and knee cleaned out  . TUBAL LIGATION       OB History   No obstetric history on file.     Family History  Problem Relation Age of Onset  . Bipolar disorder Mother   . Hypertension Father   . Diabetes Father     Social History   Tobacco Use  . Smoking status: Current Every Day Smoker    Packs/day: 1.00    Years: 29.00    Pack years: 29.00    Types: Cigarettes  . Smokeless tobacco: Never Used  Vaping Use  . Vaping Use: Former  Substance Use Topics  . Alcohol use: Not Currently    Comment: occasionally  . Drug use: No    Home Medications Prior to Admission medications   Medication Sig Start Date End Date Taking? Authorizing Provider  APPLE CIDER VINEGAR PO Take 1 capsule by mouth 3 (three) times daily  with meals.    [provider]  blood glucose meter kit and supplies Dispense based on patient and insurance preference. Use up to four times daily as directed. (FOR ICD-10 E10.9, E11.9). 01/06/18   Eugenie Filler, MD  Cholecalciferol (VITAMIN D3) 250 MCG (10000 UT) capsule Take 10,000 Units by mouth daily.    [provider]  Lactobacillus (PROBIOTIC ACIDOPHILUS) CAPS Take 1 capsule by mouth daily.    [provider]  magnesium oxide (MAG-OX) 400 (241.3 Mg) MG tablet Take 1 tablet (400 mg total) by mouth 2 (two) times daily. 07/20/20   Pokhrel, Corrie Mckusick, MD  metFORMIN (GLUCOPHAGE) 1000 MG tablet Take 0.5 tablets (500 mg total) by mouth 2 (two) times daily. Patient taking  differently: Take 1,000 mg by mouth 2 (two) times daily. 10/08/18   Andee Poles, MD  metoprolol succinate (TOPROL-XL) 25 MG 24 hr tablet Take 12.5 mg by mouth daily. 03/24/20   [provider]  Multiple Vitamin (MULTIVITAMIN WITH MINERALS) TABS tablet Take 1 tablet by mouth daily.    [provider]  risperiDONE (RISPERDAL) 0.5 MG tablet Take 1 tablet (0.5 mg total) by mouth 2 (two) times daily. 07/20/20   Pokhrel, Corrie Mckusick, MD  sertraline (ZOLOFT) 100 MG tablet Take 1.5 tablets (150 mg total) by mouth daily. 07/20/20   Pokhrel, Corrie Mckusick, MD  traZODone (DESYREL) 50 MG tablet Take 1 tablet (50 mg total) by mouth at bedtime as needed for sleep. 12/16/18   Antonieta Pert, MD  albuterol (PROVENTIL HFA;VENTOLIN HFA) 108 (90 Base) MCG/ACT inhaler Inhale 2 puffs into the lungs every 6 (six) hours as needed for wheezing or shortness of breath. Patient not taking: Reported on 12/13/2018 01/06/18 06/04/20  Eugenie Filler, MD  benztropine (COGENTIN) 1 MG tablet Take 1 tablet (1 mg total) by mouth 2 (two) times daily. 12/23/18 06/04/20  Johnn Hai, MD  insulin aspart (NOVOLOG) 100 UNIT/ML injection Inject 0-9 Units into the skin 3 (three) times daily with meals. Patient not taking: Reported on 06/03/2020 12/16/18 06/04/20  Antonieta Pert, MD  mirtazapine (REMERON) 15 MG tablet Take 1 tablet (15 mg total) by mouth at bedtime. Patient not taking: Reported on 06/03/2020 12/22/18 06/04/20  Johnn Hai, MD    Allergies    Tramadol, Wellbutrin [bupropion], and Nicotine  Review of Systems   Review of Systems  Constitutional: Positive for appetite change. Negative for fever.  HENT: Negative for congestion.   Respiratory: Negative for shortness of breath.   Cardiovascular: Negative for chest pain.  Gastrointestinal: Negative for abdominal pain.  Endocrine: Negative for polyuria.  Genitourinary: Positive for dysuria.  Musculoskeletal: Negative for back pain.  Skin: Negative for rash.  Neurological:  Negative for weakness.  Psychiatric/Behavioral: Negative for confusion and suicidal ideas.    Physical Exam Updated Vital Signs BP (!) 113/59   Pulse (!) 107   Temp 98.2 F (36.8 C) (Oral)   Resp (!) 23   Ht $R'5\' 5"'xt$  (1.651 m)   Wt 88 kg   SpO2 99%   BMI 32.28 kg/m   Physical Exam Vitals and nursing note reviewed.  HENT:     Head: Atraumatic.  Eyes:     General: No scleral icterus. Cardiovascular:     Rate and Rhythm: Normal rate.  Pulmonary:     Breath sounds: No wheezing or rhonchi.  Abdominal:     Tenderness: There is abdominal tenderness.     Comments: Mild suprapubic tenderness without rebound or guarding.  Musculoskeletal:  General: No tenderness.     Cervical back: Neck supple.  Skin:    General: Skin is warm.     Capillary Refill: Capillary refill takes less than 2 seconds.  Neurological:     Mental Status: She is alert and oriented to person, place, and time.  Psychiatric:        Mood and Affect: Mood normal.     ED Results / Procedures / Treatments   Labs (all labs ordered are listed, but only abnormal results are displayed) Labs Reviewed  BASIC METABOLIC PANEL - Abnormal; Notable for the following components:      Result Value   Glucose, Bld 259 (*)    All other components within normal limits  URINALYSIS, ROUTINE W REFLEX MICROSCOPIC - Abnormal; Notable for the following components:   Glucose, UA >=500 (*)    Leukocytes,Ua TRACE (*)    All other components within normal limits  CBG MONITORING, ED - Abnormal; Notable for the following components:   Glucose-Capillary 255 (*)    All other components within normal limits  CBG MONITORING, ED - Abnormal; Notable for the following components:   Glucose-Capillary 308 (*)    All other components within normal limits  CBG MONITORING, ED - Abnormal; Notable for the following components:   Glucose-Capillary 198 (*)    All other components within normal limits  CBC  I-STAT BETA HCG BLOOD, ED ([facility], [facility],  AP ONLY)  CBG MONITORING, ED    EKG None  Radiology No results found.  Procedures Procedures   Medications Ordered in ED Medications  sodium chloride 0.9 % bolus 1,000 mL (0 mLs Intravenous Stopped 08/22/20 2131)  insulin aspart (novoLOG) injection 6 Units (6 Units Intravenous Given 08/22/20 2149)  sodium chloride 0.9 % bolus 1,000 mL (1,000 mLs Intravenous New Bag/Given 08/22/20 2149)  acetaminophen (TYLENOL) tablet 650 mg (650 mg Oral Given 08/22/20 2149)    ED Course  I have reviewed the triage vital signs and the nursing notes.  Pertinent labs & imaging results that were available during my care of the patient were reviewed by me and considered in my medical decision making (see chart for details).    MDM Rules/Calculators/A&P                          Patient presents reportedly for hyperglycemia.  However patient did not call EMS apparently she was lying down.  Is homeless.  Had been at [facility] yesterday and discharged.  CBG elevated here but not in DKA.   sugar went from 250 up to 300 and now down to under 200.  Has been noncompliant with her medications.  Not in DKA.  Appears stable for discharge.  Resources given for shelters Final Clinical Impression(s) / ED Diagnoses Final diagnoses:  Hyperglycemia    Rx / DC Orders ED Discharge Orders    None       Davonna Belling, MD 08/22/20 2244

## 2020-08-22 NOTE — ED Notes (Signed)
Pt c/o tingling in arms and legs and headache. Pt worried it could be from the insulin. Pt reassured by rechecking CBG. CBG is 198. MD notified

## 2020-08-22 NOTE — ED Triage Notes (Signed)
Pt BIB [facility] with c/o high blood sugar and nausea.  Pt was seen earlier at a hospital in [place] and was "kicked" out. Pt is homeless and someone saw her out in the field and called EMS. Per EMS pt CBG was 342, BP 140/80 and HR in the 100's. Pt was given 500 ml NS and 4 mg Zofran.

## 2020-08-22 NOTE — ED Notes (Signed)
Pt ambulatory to bathroom for urine sample.

## 2020-08-23 ENCOUNTER — Emergency Department (HOSPITAL_COMMUNITY)
Admission: EM | Admit: 2020-08-23 | Discharge: 2020-08-23 | Disposition: A | Discharge status: Home / Self Care | Payer: Medicaid Other | Attending: Emergency Medicine | Admitting: Emergency Medicine

## 2020-08-23 ENCOUNTER — Encounter (HOSPITAL_COMMUNITY): Payer: Self-pay | Admitting: Emergency Medicine

## 2020-08-23 DIAGNOSIS — J45909 Unspecified asthma, uncomplicated: Secondary | ICD-10-CM | POA: Diagnosis not present

## 2020-08-23 DIAGNOSIS — G43009 Migraine without aura, not intractable, without status migrainosus: Secondary | ICD-10-CM | POA: Insufficient documentation

## 2020-08-23 DIAGNOSIS — F17210 Nicotine dependence, cigarettes, uncomplicated: Secondary | ICD-10-CM | POA: Insufficient documentation

## 2020-08-23 DIAGNOSIS — Z794 Long term (current) use of insulin: Secondary | ICD-10-CM | POA: Diagnosis not present

## 2020-08-23 DIAGNOSIS — E1110 Type 2 diabetes mellitus with ketoacidosis without coma: Secondary | ICD-10-CM | POA: Insufficient documentation

## 2020-08-23 DIAGNOSIS — I10 Essential (primary) hypertension: Secondary | ICD-10-CM | POA: Diagnosis not present

## 2020-08-23 DIAGNOSIS — Z7984 Long term (current) use of oral hypoglycemic drugs: Secondary | ICD-10-CM | POA: Diagnosis not present

## 2020-08-23 DIAGNOSIS — Z79899 Other long term (current) drug therapy: Secondary | ICD-10-CM | POA: Insufficient documentation

## 2020-08-23 LAB — CBC
HCT: 38.2 % (ref 36.0–46.0)
Hemoglobin: 12.2 g/dL (ref 12.0–15.0)
MCH: 29.2 pg (ref 26.0–34.0)
MCHC: 31.9 g/dL (ref 30.0–36.0)
MCV: 91.4 fL (ref 80.0–100.0)
Platelets: 207 10*3/uL (ref 150–400)
RBC: 4.18 MIL/uL (ref 3.87–5.11)
RDW: 14.5 % (ref 11.5–15.5)
WBC: 7.4 10*3/uL (ref 4.0–10.5)
nRBC: 0 % (ref 0.0–0.2)

## 2020-08-23 LAB — BASIC METABOLIC PANEL
Anion gap: 9 (ref 5–15)
BUN: 14 mg/dL (ref 6–20)
CO2: 26 mmol/L (ref 22–32)
Calcium: 8.7 mg/dL — ABNORMAL LOW (ref 8.9–10.3)
Chloride: 106 mmol/L (ref 98–111)
Creatinine, Ser: 0.91 mg/dL (ref 0.44–1.00)
GFR, Estimated: >60 mL/min (ref >60)
Glucose, Bld: 268 mg/dL — ABNORMAL HIGH (ref 70–99)
Potassium: 4.6 mmol/L (ref 3.5–5.1)
Sodium: 141 mmol/L (ref 135–145)

## 2020-08-23 MED ORDER — PROCHLORPERAZINE EDISYLATE 10 MG/2ML IJ SOLN
10.0000 mg | Freq: Once | INTRAMUSCULAR | Status: AC
  Administered 2020-08-23: 10 mg via INTRAVENOUS
  Filled 2020-08-23: qty 2

## 2020-08-23 MED ORDER — SODIUM CHLORIDE 0.9 % IV BOLUS
1000.0000 mL | Freq: Once | INTRAVENOUS | Status: AC
  Administered 2020-08-23: 1000 mL via INTRAVENOUS

## 2020-08-23 MED ORDER — DIPHENHYDRAMINE HCL 50 MG/ML IJ SOLN
25.0000 mg | Freq: Once | INTRAMUSCULAR | Status: AC
  Administered 2020-08-23: 25 mg via INTRAVENOUS
  Filled 2020-08-23: qty 1

## 2020-08-23 MED ORDER — KETOROLAC TROMETHAMINE 30 MG/ML IJ SOLN
15.0000 mg | Freq: Once | INTRAMUSCULAR | Status: AC
  Administered 2020-08-23: 15 mg via INTRAVENOUS
  Filled 2020-08-23: qty 1

## 2020-08-23 NOTE — ED Provider Notes (Signed)
[place] DEPT Provider Note   CSN: [id number] Arrival date & time: 08/23/20  0548     History Chief Complaint  Patient presents with  . Headache    [name] [name] is a 50 y.o. female.  Patient presents to the emergency department with complaints of headache.  Patient has bitemporal headache that radiates to the back of her head.  Patient reports light sensitivity and sensitivity to sound.  No nausea or vomiting.  No neck pain or stiffness.  She has not had a fever.  Patient does report a history of migraine headaches and has had similar headaches in the past.        Past Medical History:  Diagnosis Date  . Anxiety disorder   . Asthma   . Bipolar affective disorder ([place])   . Depression   . Diabetes mellitus   . Migraine   . Schizophrenia [facility])     Patient Active Problem List   Diagnosis Date Noted  . Acute pyelonephritis 07/17/2020  . Homelessness 06/12/2020  . Bipolar 1 disorder ([place]) 12/18/2018  . Suicide attempt ([place])   . Overdose, intentional self-harm, initial encounter ([place]) 12/13/2018  . Bipolar I disorder, single manic episode, severe, with psychosis ([place]) 06/09/2018  . Bipolar disorder ([place]) 06/09/2018  . DKA, type 2 ([place]) 01/06/2018  . Diabetic acidosis without coma ([place])   . Adjustment disorder with depressed mood 01/04/2018  . Oral thrush   . Acute lower UTI   . DKA, type 2, not at goal [facility]) 01/03/2018  . Thrush 01/03/2018  . DM (diabetes mellitus), type 2, uncontrolled ([place]) 06/07/2017  . Abscess of groin, right 06/07/2017  . Abscess 06/07/2017  . Bipolar disorder with moderate depression ([place]) 08/31/2015  . Tobacco use disorder 08/28/2015  . HTN (hypertension) 08/28/2015  . Diabetes ([place]) 08/28/2015  . Hyponatremia 08/28/2015  . T2DM (type 2 diabetes mellitus) ([place]) 10/26/2009  . OBESITY 10/26/2009  . Essential hypertension 10/26/2009  . DISTURBANCE OF SKIN SENSATION 10/26/2009  . Migraine headache  03/05/2007    Past Surgical History:  Procedure Laterality Date  . KNEE ARTHROSCOPY     x 2  . KNEE SURGERY Left    Incision made and knee cleaned out  . TUBAL LIGATION       OB History   No obstetric history on file.     Family History  Problem Relation Age of Onset  . Bipolar disorder Mother   . Hypertension Father   . Diabetes Father     Social History   Tobacco Use  . Smoking status: Current Every Day Smoker    Packs/day: 1.00    Years: 29.00    Pack years: 29.00    Types: Cigarettes  . Smokeless tobacco: Never Used  Vaping Use  . Vaping Use: Former  Substance Use Topics  . Alcohol use: Not Currently    Comment: occasionally  . Drug use: No    Home Medications Prior to Admission medications   Medication Sig Start Date End Date Taking? Authorizing Provider  APPLE CIDER VINEGAR PO Take 1 capsule by mouth 3 (three) times daily with meals.    [provider]  blood glucose meter kit and supplies Dispense based on patient and insurance preference. Use up to four times daily as directed. (FOR ICD-10 E10.9, E11.9). 01/06/18   Eugenie Filler, MD  Cholecalciferol (VITAMIN D3) 250 MCG (10000 UT) capsule Take 10,000 Units by mouth daily.    [provider]  Lactobacillus (  PROBIOTIC ACIDOPHILUS) CAPS Take 1 capsule by mouth daily.    [provider]  magnesium oxide (MAG-OX) 400 (241.3 Mg) MG tablet Take 1 tablet (400 mg total) by mouth 2 (two) times daily. 07/20/20   Pokhrel, Corrie Mckusick, MD  metFORMIN (GLUCOPHAGE) 1000 MG tablet Take 0.5 tablets (500 mg total) by mouth 2 (two) times daily. Patient taking differently: Take 1,000 mg by mouth 2 (two) times daily. 10/08/18   Andee Poles, MD  metoprolol succinate (TOPROL-XL) 25 MG 24 hr tablet Take 12.5 mg by mouth daily. 03/24/20   [provider]  Multiple Vitamin (MULTIVITAMIN WITH MINERALS) TABS tablet Take 1 tablet by mouth daily.    [provider]  risperiDONE  (RISPERDAL) 0.5 MG tablet Take 1 tablet (0.5 mg total) by mouth 2 (two) times daily. 07/20/20   Pokhrel, Corrie Mckusick, MD  sertraline (ZOLOFT) 100 MG tablet Take 1.5 tablets (150 mg total) by mouth daily. 07/20/20   Pokhrel, Corrie Mckusick, MD  traZODone (DESYREL) 50 MG tablet Take 1 tablet (50 mg total) by mouth at bedtime as needed for sleep. 12/16/18   Antonieta Pert, MD  albuterol (PROVENTIL HFA;VENTOLIN HFA) 108 (90 Base) MCG/ACT inhaler Inhale 2 puffs into the lungs every 6 (six) hours as needed for wheezing or shortness of breath. Patient not taking: Reported on 12/13/2018 01/06/18 06/04/20  Eugenie Filler, MD  benztropine (COGENTIN) 1 MG tablet Take 1 tablet (1 mg total) by mouth 2 (two) times daily. 12/23/18 06/04/20  Johnn Hai, MD  insulin aspart (NOVOLOG) 100 UNIT/ML injection Inject 0-9 Units into the skin 3 (three) times daily with meals. Patient not taking: Reported on 06/03/2020 12/16/18 06/04/20  Antonieta Pert, MD  mirtazapine (REMERON) 15 MG tablet Take 1 tablet (15 mg total) by mouth at bedtime. Patient not taking: Reported on 06/03/2020 12/22/18 06/04/20  Johnn Hai, MD    Allergies    Tramadol, Wellbutrin [bupropion], and Nicotine  Review of Systems   Review of Systems  Eyes: Positive for photophobia.  Neurological: Positive for headaches.  All other systems reviewed and are negative.   Physical Exam Updated Vital Signs BP (!) 146/97 (BP Location: Left Arm)   Pulse (!) 114   Temp 98.5 F (36.9 C) (Oral)   Resp 18   Ht $R'5\' 5"'bw$  (1.651 m)   Wt 88 kg   SpO2 95%   BMI 32.28 kg/m   Physical Exam Vitals and nursing note reviewed.  Constitutional:      General: She is not in acute distress.    Appearance: Normal appearance. She is well-developed.  HENT:     Head: Normocephalic and atraumatic.     Right Ear: Hearing normal.     Left Ear: Hearing normal.     Nose: Nose normal.  Eyes:     Conjunctiva/sclera: Conjunctivae normal.     Pupils: Pupils are equal, round, and reactive to  light.  Cardiovascular:     Rate and Rhythm: Regular rhythm.     Heart sounds: S1 normal and S2 normal. No murmur heard. No friction rub. No gallop.   Pulmonary:     Effort: Pulmonary effort is normal. No respiratory distress.     Breath sounds: Normal breath sounds.  Chest:     Chest wall: No tenderness.  Abdominal:     General: Bowel sounds are normal.     Palpations: Abdomen is soft.     Tenderness: There is no abdominal tenderness. There is no guarding or rebound. Negative signs include Murphy's sign and McBurney's  sign.     Hernia: No hernia is present.  Musculoskeletal:        General: Normal range of motion.     Cervical back: Normal range of motion and neck supple.  Skin:    General: Skin is warm and dry.     Findings: No rash.  Neurological:     Mental Status: She is alert and oriented to person, place, and time.     GCS: GCS eye subscore is 4. GCS verbal subscore is 5. GCS motor subscore is 6.     Cranial Nerves: No cranial nerve deficit.     Sensory: No sensory deficit.     Coordination: Coordination normal.  Psychiatric:        Speech: Speech normal.        Behavior: Behavior normal.        Thought Content: Thought content normal.     ED Results / Procedures / Treatments   Labs (all labs ordered are listed, but only abnormal results are displayed) Labs Reviewed - No data to display  EKG None  Radiology No results found.  Procedures Procedures   Medications Ordered in ED Medications - No data to display  ED Course  I have reviewed the triage vital signs and the nursing notes.  Pertinent labs & imaging results that were available during my care of the patient were reviewed by me and considered in my medical decision making (see chart for details).    MDM Rules/Calculators/A&P                          Patient presents to the emergency department for evaluation of migraine headache. Patient is currently experiencing a headache that is similar to  previous migraines. Patient does not have any unusual features compared to previous migraines. Patient has normal neurologic examination. There are no unusual features, such as unusual intensity or sudden onset. As this headache is similar to previous migraines, there is no concern for subarachnoid hemorrhage or other etiology. Patient therefore does not require imaging. Patient treated as migraine headache.  Final Clinical Impression(s) / ED Diagnoses Final diagnoses:  Migraine without aura and without status migrainosus, not intractable    Rx / DC Orders ED Discharge Orders    None       Pollina, Gwenyth Allegra, MD 08/23/20 [phone]

## 2020-08-23 NOTE — ED Triage Notes (Addendum)
Patient is complaining of a headache that is making her have blurred vision. Patient states this started yesterday. Patient was seen at [facility] yesterday. Patient is homeless and rude.

## 2020-08-23 NOTE — ED Notes (Signed)
RN entered room to discharge patient, went over paperwork, removed IV, pt then stood up and turned lights back off and said "goodnight". Attempted several times to tell patient she's been discharged and it is time to wake up and leave. Pt requested coffee, brought pt coffee. Pt still refusing to leave, demanding to speak w MD, night time MD discharged pt and has left for the day- told pt this, she then asked to speak with the Pope several times- reassured pt that the pope is not at [facility] ED. Pt speaking jibberish, continuously getting up to turn lights off and laying back in bed. Security called to escort pt out of ED.

## 2020-09-04 ENCOUNTER — Other Ambulatory Visit: Payer: Self-pay

## 2020-09-04 ENCOUNTER — Encounter (HOSPITAL_COMMUNITY): Payer: Self-pay | Admitting: Emergency Medicine

## 2020-09-04 ENCOUNTER — Emergency Department (HOSPITAL_COMMUNITY)
Admission: EM | Admit: 2020-09-04 | Discharge: 2020-09-05 | Disposition: A | Discharge status: Home / Self Care | Payer: Medicaid Other | Attending: Emergency Medicine | Admitting: Emergency Medicine

## 2020-09-04 ENCOUNTER — Emergency Department (HOSPITAL_COMMUNITY): Payer: Medicaid Other

## 2020-09-04 DIAGNOSIS — J45909 Unspecified asthma, uncomplicated: Secondary | ICD-10-CM | POA: Diagnosis not present

## 2020-09-04 DIAGNOSIS — Z794 Long term (current) use of insulin: Secondary | ICD-10-CM | POA: Diagnosis not present

## 2020-09-04 DIAGNOSIS — E119 Type 2 diabetes mellitus without complications: Secondary | ICD-10-CM | POA: Diagnosis not present

## 2020-09-04 DIAGNOSIS — Z5900 Homelessness unspecified: Secondary | ICD-10-CM | POA: Diagnosis not present

## 2020-09-04 DIAGNOSIS — F17210 Nicotine dependence, cigarettes, uncomplicated: Secondary | ICD-10-CM | POA: Insufficient documentation

## 2020-09-04 DIAGNOSIS — R0789 Other chest pain: Secondary | ICD-10-CM | POA: Insufficient documentation

## 2020-09-04 DIAGNOSIS — L739 Follicular disorder, unspecified: Secondary | ICD-10-CM | POA: Diagnosis not present

## 2020-09-04 DIAGNOSIS — Z79899 Other long term (current) drug therapy: Secondary | ICD-10-CM | POA: Diagnosis not present

## 2020-09-04 DIAGNOSIS — R079 Chest pain, unspecified: Secondary | ICD-10-CM | POA: Diagnosis present

## 2020-09-04 DIAGNOSIS — F319 Bipolar disorder, unspecified: Secondary | ICD-10-CM | POA: Diagnosis not present

## 2020-09-04 LAB — BASIC METABOLIC PANEL
Anion gap: 6 (ref 5–15)
BUN: 14 mg/dL (ref 6–20)
CO2: 24 mmol/L (ref 22–32)
Calcium: 8.7 mg/dL — ABNORMAL LOW (ref 8.9–10.3)
Chloride: 106 mmol/L (ref 98–111)
Creatinine, Ser: 0.97 mg/dL (ref 0.44–1.00)
GFR, Estimated: >60 mL/min (ref >60)
Glucose, Bld: 248 mg/dL — ABNORMAL HIGH (ref 70–99)
Potassium: 3.8 mmol/L (ref 3.5–5.1)
Sodium: 136 mmol/L (ref 135–145)

## 2020-09-04 LAB — CBC
HCT: 36.3 % (ref 36.0–46.0)
Hemoglobin: 11.8 g/dL — ABNORMAL LOW (ref 12.0–15.0)
MCH: 28.9 pg (ref 26.0–34.0)
MCHC: 32.5 g/dL (ref 30.0–36.0)
MCV: 89 fL (ref 80.0–100.0)
Platelets: 212 10*3/uL (ref 150–400)
RBC: 4.08 MIL/uL (ref 3.87–5.11)
RDW: 14.8 % (ref 11.5–15.5)
WBC: 5.6 10*3/uL (ref 4.0–10.5)
nRBC: 0 % (ref 0.0–0.2)

## 2020-09-04 LAB — TROPONIN I (HIGH SENSITIVITY)
Troponin I (High Sensitivity): 36 ng/L — ABNORMAL HIGH (ref <18)
Troponin I (High Sensitivity): 37 ng/L — ABNORMAL HIGH (ref <18)

## 2020-09-04 LAB — I-STAT BETA HCG BLOOD, ED (MC, WL, AP ONLY): I-stat hCG, quantitative: <5 m[IU]/mL (ref <5)

## 2020-09-04 NOTE — ED Triage Notes (Signed)
Patient arrived with EMS from street reports central chest pain with SOB for several days , denies cough or fever .

## 2020-09-05 MED ORDER — ALBUTEROL SULFATE HFA 108 (90 BASE) MCG/ACT IN AERS
2.0000 | INHALATION_SPRAY | Freq: Once | RESPIRATORY_TRACT | Status: AC
  Administered 2020-09-05: 2 via RESPIRATORY_TRACT

## 2020-09-05 MED ORDER — CEPHALEXIN 500 MG PO CAPS: 500.0000 mg | ORAL_CAPSULE | Freq: Three times a day (TID) | ORAL | 0 refills | Status: DC

## 2020-09-05 MED ORDER — ALBUTEROL SULFATE HFA 108 (90 BASE) MCG/ACT IN AERS
INHALATION_SPRAY | RESPIRATORY_TRACT | Status: AC
  Filled 2020-09-05: qty 6.7

## 2020-09-05 MED ORDER — CEPHALEXIN 250 MG PO CAPS
ORAL_CAPSULE | ORAL | Status: AC
  Filled 2020-09-05: qty 2

## 2020-09-05 MED ORDER — CEPHALEXIN 250 MG PO CAPS
500.0000 mg | ORAL_CAPSULE | Freq: Once | ORAL | Status: AC
  Administered 2020-09-05: 500 mg via ORAL

## 2020-09-05 NOTE — ED Notes (Signed)
E-signature pad unavailable at time of pt discharge. This RN discussed discharge materials with pt and answered all pt questions. Pt stated understanding of discharge material. ? ?

## 2020-09-05 NOTE — ED Notes (Signed)
This RN removed pt's peripheral IV

## 2020-09-05 NOTE — ED Provider Notes (Signed)
[name] Provider Note   CSN: [id number] Arrival date & time: 09/04/20  1947     History Chief Complaint  Patient presents with  . Chest Pain    [name] [name] is a 50 y.o. female.  Patient is a 50 year old female with extensive past medical history including type 2 diabetes, asthma, anxiety, bipolar, schizophrenia, and homelessness.  Patient presenting here with multiple complaints.  She was originally triaged as chest pain, however this particular complaint was excluded from what she described to me.  She describes weakness, headache, shortness of breath, and feeling generally unwell.  She also reports some sort of syncopal episode this afternoon.  She also describes a rash to her left buttock that has been present for over 1 month.  She was prescribed an antibiotic, however tells me she lost it.  Patient is homeless and tells me she has been off of all of her medications for over 1 month.  These prescriptions have been called in to the pharmacy by her doctor, however she has no transportation to pick them up.  The history is provided by the patient.       Past Medical History:  Diagnosis Date  . Anxiety disorder   . Asthma   . Bipolar affective disorder ([place])   . Depression   . Diabetes mellitus   . Migraine   . Schizophrenia [facility])     Patient Active Problem List   Diagnosis Date Noted  . Acute pyelonephritis 07/17/2020  . Homelessness 06/12/2020  . Bipolar 1 disorder ([place]) 12/18/2018  . Suicide attempt ([place])   . Overdose, intentional self-harm, initial encounter ([place]) 12/13/2018  . Bipolar I disorder, single manic episode, severe, with psychosis ([place]) 06/09/2018  . Bipolar disorder ([place]) 06/09/2018  . DKA, type 2 ([place]) 01/06/2018  . Diabetic acidosis without coma ([place])   . Adjustment disorder with depressed mood 01/04/2018  . Oral thrush   . Acute lower UTI   . DKA, type 2, not at goal [facility]) 01/03/2018  . Thrush  01/03/2018  . DM (diabetes mellitus), type 2, uncontrolled ([place]) 06/07/2017  . Abscess of groin, right 06/07/2017  . Abscess 06/07/2017  . Bipolar disorder with moderate depression ([place]) 08/31/2015  . Tobacco use disorder 08/28/2015  . HTN (hypertension) 08/28/2015  . Diabetes ([place]) 08/28/2015  . Hyponatremia 08/28/2015  . T2DM (type 2 diabetes mellitus) ([place]) 10/26/2009  . OBESITY 10/26/2009  . Essential hypertension 10/26/2009  . DISTURBANCE OF SKIN SENSATION 10/26/2009  . Migraine headache 03/05/2007    Past Surgical History:  Procedure Laterality Date  . KNEE ARTHROSCOPY     x 2  . KNEE SURGERY Left    Incision made and knee cleaned out  . TUBAL LIGATION       OB History   No obstetric history on file.     Family History  Problem Relation Age of Onset  . Bipolar disorder Mother   . Hypertension Father   . Diabetes Father     Social History   Tobacco Use  . Smoking status: Current Every Day Smoker    Packs/day: 1.00    Years: 29.00    Pack years: 29.00    Types: Cigarettes  . Smokeless tobacco: Never Used  Vaping Use  . Vaping Use: Former  Substance Use Topics  . Alcohol use: Not Currently    Comment: occasionally  . Drug use: No    Home Medications Prior to Admission medications   Medication Sig Start  Date End Date Taking? Authorizing Provider  APPLE CIDER VINEGAR PO Take 1 capsule by mouth 3 (three) times daily with meals.    [provider]  blood glucose meter kit and supplies Dispense based on patient and insurance preference. Use up to four times daily as directed. (FOR ICD-10 E10.9, E11.9). 01/06/18   Eugenie Filler, MD  Cholecalciferol (VITAMIN D3) 250 MCG (10000 UT) capsule Take 10,000 Units by mouth daily.    [provider]  Lactobacillus (PROBIOTIC ACIDOPHILUS) CAPS Take 1 capsule by mouth daily.    [provider]  magnesium oxide (MAG-OX) 400 (241.3 Mg) MG tablet Take 1 tablet (400 mg total) by mouth 2 (two)  times daily. 07/20/20   Pokhrel, Corrie Mckusick, MD  metFORMIN (GLUCOPHAGE) 1000 MG tablet Take 0.5 tablets (500 mg total) by mouth 2 (two) times daily. Patient taking differently: Take 1,000 mg by mouth 2 (two) times daily. 10/08/18   Andee Poles, MD  metoprolol succinate (TOPROL-XL) 25 MG 24 hr tablet Take 12.5 mg by mouth daily. 03/24/20   [provider]  Multiple Vitamin (MULTIVITAMIN WITH MINERALS) TABS tablet Take 1 tablet by mouth daily.    [provider]  risperiDONE (RISPERDAL) 0.5 MG tablet Take 1 tablet (0.5 mg total) by mouth 2 (two) times daily. 07/20/20   Pokhrel, Corrie Mckusick, MD  sertraline (ZOLOFT) 100 MG tablet Take 1.5 tablets (150 mg total) by mouth daily. 07/20/20   Pokhrel, Corrie Mckusick, MD  traZODone (DESYREL) 50 MG tablet Take 1 tablet (50 mg total) by mouth at bedtime as needed for sleep. 12/16/18   Antonieta Pert, MD  albuterol (PROVENTIL HFA;VENTOLIN HFA) 108 (90 Base) MCG/ACT inhaler Inhale 2 puffs into the lungs every 6 (six) hours as needed for wheezing or shortness of breath. Patient not taking: Reported on 12/13/2018 01/06/18 06/04/20  Eugenie Filler, MD  benztropine (COGENTIN) 1 MG tablet Take 1 tablet (1 mg total) by mouth 2 (two) times daily. 12/23/18 06/04/20  Johnn Hai, MD  insulin aspart (NOVOLOG) 100 UNIT/ML injection Inject 0-9 Units into the skin 3 (three) times daily with meals. Patient not taking: Reported on 06/03/2020 12/16/18 06/04/20  Antonieta Pert, MD  mirtazapine (REMERON) 15 MG tablet Take 1 tablet (15 mg total) by mouth at bedtime. Patient not taking: Reported on 06/03/2020 12/22/18 06/04/20  Johnn Hai, MD    Allergies    Tramadol, Wellbutrin [bupropion], and Nicotine  Review of Systems   Review of Systems  All other systems reviewed and are negative.   Physical Exam Updated Vital Signs BP (!) 122/98   Pulse (!) 109   Temp 98.6 F (37 C)   Resp 17   Ht 5' 5" (1.651 m)   Wt 80 kg   SpO2 97%   BMI 29.35 kg/m   Physical Exam Vitals  and nursing note reviewed.  Constitutional:      General: She is not in acute distress.    Appearance: She is well-developed. She is not diaphoretic.  HENT:     Head: Normocephalic and atraumatic.  Cardiovascular:     Rate and Rhythm: Normal rate and regular rhythm.     Heart sounds: No murmur heard. No friction rub. No gallop.   Pulmonary:     Effort: Pulmonary effort is normal. No respiratory distress.     Breath sounds: Examination of the right-middle field reveals rhonchi. Examination of the left-middle field reveals rhonchi. Rhonchi present.  Abdominal:     General: Bowel sounds are normal. There is no distension.  Palpations: Abdomen is soft.     Tenderness: There is no abdominal tenderness.  Musculoskeletal:        General: Normal range of motion.     Cervical back: Normal range of motion and neck supple.     Right lower leg: No tenderness. No edema.     Left lower leg: No tenderness. No edema.  Skin:    General: Skin is warm and dry.     Comments: There is a follicular rash noted to the left buttock.  There is no purulent drainage or significant erythema.  Neurological:     Mental Status: She is alert and oriented to person, place, and time.     ED Results / Procedures / Treatments   Labs (all labs ordered are listed, but only abnormal results are displayed) Labs Reviewed  BASIC METABOLIC PANEL - Abnormal; Notable for the following components:      Result Value   Glucose, Bld 248 (*)    Calcium 8.7 (*)    All other components within normal limits  CBC - Abnormal; Notable for the following components:   Hemoglobin 11.8 (*)    All other components within normal limits  TROPONIN I (HIGH SENSITIVITY) - Abnormal; Notable for the following components:   Troponin I (High Sensitivity) 36 (*)    All other components within normal limits  TROPONIN I (HIGH SENSITIVITY) - Abnormal; Notable for the following components:   Troponin I (High Sensitivity) 37 (*)    All other  components within normal limits  I-STAT BETA HCG BLOOD, ED ([facility], [facility], [facility] ONLY)    EKG ED ECG REPORT   Date: 09/05/2020  Rate: 109  Rhythm: sinus tachycardia  QRS Axis: normal  Intervals: normal  ST/T Wave abnormalities: normal  Conduction Disutrbances:none  Narrative Interpretation:   Old EKG Reviewed: unchanged  I have personally reviewed the EKG tracing and agree with the computerized printout as noted.   Radiology DG Chest 2 View  Result Date: 09/04/2020 CLINICAL DATA:  Chest pain, shortness of breath, cough EXAM: CHEST - 2 VIEW COMPARISON:  07/17/2020 FINDINGS: No consolidation, features of edema, pneumothorax, or effusion. Pulmonary vascularity is normally distributed. The cardiomediastinal contours are unremarkable. No acute osseous or soft tissue abnormality. Multilevel degenerative changes are present in the imaged portions of the spine. Mild degenerative changes in the shoulders as well. IMPRESSION: No acute cardiopulmonary process. Electronically Signed   By: Lovena Le M.D.   On: 09/04/2020 20:19    Procedures Procedures   Medications Ordered in ED Medications  albuterol (VENTOLIN HFA) 108 (90 Base) MCG/ACT inhaler 2 puff (has no administration in time range)  cephALEXin (KEFLEX) capsule 500 mg (has no administration in time range)    ED Course  I have reviewed the triage vital signs and the nursing notes.  Pertinent labs & imaging results that were available during my care of the patient were reviewed by me and considered in my medical decision making (see chart for details).    MDM Rules/Calculators/A&P  Patient brought by EMS for evaluation of the complaints listed in the HPI.  Patient's vitals are stable with no hypoxia.  Her EKG is unchanged from prior studies and laboratory studies today are unremarkable.  Her sugar is 240, but there is no other significant electrolyte abnormality.  Her initial troponin was 36, then was repeated at 37.  I am uncertain as to  the significance of this, but she is not describing any chest pains to me.  I  see no indication for admission.  I feel as the patient needs to be back on her medications.  She has told me that these prescriptions were called in and I have advised her to get to the pharmacy to get these filled.  Final Clinical Impression(s) / ED Diagnoses Final diagnoses:  None    Rx / DC Orders ED Discharge Orders    None       Veryl Speak, MD 09/05/20 [phone]

## 2020-09-05 NOTE — Discharge Instructions (Addendum)
Begin taking Keflex as prescribed.  Use albuterol 2 puffs every 4 hours as needed for wheezing.  Refrain from cigarette smoking.  Fill the prescriptions for the medications prescribed by your primary doctor and follow-up in the next 1 to 2 weeks.

## 2022-05-15 ENCOUNTER — Inpatient Hospital Stay: Payer: Self-pay

## 2022-05-15 ENCOUNTER — Other Ambulatory Visit: Payer: Self-pay

## 2022-05-15 ENCOUNTER — Inpatient Hospital Stay (HOSPITAL_COMMUNITY)
Admission: EM | Admit: 2022-05-15 | Discharge: 2022-05-17 | DRG: 291 | Disposition: A | Discharge status: Home / Self Care | Payer: Medicaid Other | Attending: Family Medicine | Admitting: Family Medicine

## 2022-05-15 ENCOUNTER — Emergency Department (HOSPITAL_COMMUNITY): Payer: Medicaid Other

## 2022-05-15 ENCOUNTER — Encounter (HOSPITAL_COMMUNITY): Payer: Self-pay

## 2022-05-15 ENCOUNTER — Inpatient Hospital Stay (HOSPITAL_COMMUNITY): Payer: Medicaid Other

## 2022-05-15 DIAGNOSIS — F17210 Nicotine dependence, cigarettes, uncomplicated: Secondary | ICD-10-CM | POA: Diagnosis present

## 2022-05-15 DIAGNOSIS — J4489 Other specified chronic obstructive pulmonary disease: Secondary | ICD-10-CM | POA: Diagnosis present

## 2022-05-15 DIAGNOSIS — R7401 Elevation of levels of liver transaminase levels: Secondary | ICD-10-CM | POA: Diagnosis present

## 2022-05-15 DIAGNOSIS — I5043 Acute on chronic combined systolic (congestive) and diastolic (congestive) heart failure: Secondary | ICD-10-CM | POA: Diagnosis present

## 2022-05-15 DIAGNOSIS — F419 Anxiety disorder, unspecified: Secondary | ICD-10-CM | POA: Diagnosis present

## 2022-05-15 DIAGNOSIS — I081 Rheumatic disorders of both mitral and tricuspid valves: Secondary | ICD-10-CM | POA: Diagnosis present

## 2022-05-15 DIAGNOSIS — I5023 Acute on chronic systolic (congestive) heart failure: Secondary | ICD-10-CM

## 2022-05-15 DIAGNOSIS — Z833 Family history of diabetes mellitus: Secondary | ICD-10-CM

## 2022-05-15 DIAGNOSIS — I110 Hypertensive heart disease with heart failure: Secondary | ICD-10-CM | POA: Diagnosis present

## 2022-05-15 DIAGNOSIS — Z7984 Long term (current) use of oral hypoglycemic drugs: Secondary | ICD-10-CM

## 2022-05-15 DIAGNOSIS — G8929 Other chronic pain: Secondary | ICD-10-CM | POA: Diagnosis present

## 2022-05-15 DIAGNOSIS — I420 Dilated cardiomyopathy: Secondary | ICD-10-CM | POA: Diagnosis present

## 2022-05-15 DIAGNOSIS — I2489 Other forms of acute ischemic heart disease: Secondary | ICD-10-CM | POA: Diagnosis present

## 2022-05-15 DIAGNOSIS — Z818 Family history of other mental and behavioral disorders: Secondary | ICD-10-CM | POA: Diagnosis not present

## 2022-05-15 DIAGNOSIS — F319 Bipolar disorder, unspecified: Secondary | ICD-10-CM | POA: Diagnosis present

## 2022-05-15 DIAGNOSIS — E876 Hypokalemia: Secondary | ICD-10-CM | POA: Diagnosis present

## 2022-05-15 DIAGNOSIS — Z885 Allergy status to narcotic agent status: Secondary | ICD-10-CM

## 2022-05-15 DIAGNOSIS — M545 Low back pain, unspecified: Secondary | ICD-10-CM | POA: Diagnosis present

## 2022-05-15 DIAGNOSIS — E785 Hyperlipidemia, unspecified: Secondary | ICD-10-CM | POA: Diagnosis present

## 2022-05-15 DIAGNOSIS — F259 Schizoaffective disorder, unspecified: Secondary | ICD-10-CM | POA: Diagnosis present

## 2022-05-15 DIAGNOSIS — Z8249 Family history of ischemic heart disease and other diseases of the circulatory system: Secondary | ICD-10-CM | POA: Diagnosis not present

## 2022-05-15 DIAGNOSIS — R0602 Shortness of breath: Secondary | ICD-10-CM | POA: Diagnosis present

## 2022-05-15 DIAGNOSIS — I509 Heart failure, unspecified: Principal | ICD-10-CM

## 2022-05-15 DIAGNOSIS — Z888 Allergy status to other drugs, medicaments and biological substances status: Secondary | ICD-10-CM

## 2022-05-15 DIAGNOSIS — E1165 Type 2 diabetes mellitus with hyperglycemia: Secondary | ICD-10-CM | POA: Diagnosis present

## 2022-05-15 DIAGNOSIS — Z79891 Long term (current) use of opiate analgesic: Secondary | ICD-10-CM

## 2022-05-15 DIAGNOSIS — N179 Acute kidney failure, unspecified: Secondary | ICD-10-CM | POA: Diagnosis present

## 2022-05-15 DIAGNOSIS — Z79899 Other long term (current) drug therapy: Secondary | ICD-10-CM

## 2022-05-15 DIAGNOSIS — Z91048 Other nonmedicinal substance allergy status: Secondary | ICD-10-CM | POA: Diagnosis not present

## 2022-05-15 LAB — CBC WITH DIFFERENTIAL/PLATELET
Abs Immature Granulocytes: 0.03 10*3/uL (ref 0.00–0.07)
Basophils Absolute: 0.1 10*3/uL (ref 0.0–0.1)
Basophils Relative: 1 %
Eosinophils Absolute: 0.2 10*3/uL (ref 0.0–0.5)
Eosinophils Relative: 3 %
HCT: 33.4 % — ABNORMAL LOW (ref 36.0–46.0)
Hemoglobin: 10.8 g/dL — ABNORMAL LOW (ref 12.0–15.0)
Immature Granulocytes: 1 %
Lymphocytes Relative: 22 %
Lymphs Abs: 1.4 10*3/uL (ref 0.7–4.0)
MCH: 29.1 pg (ref 26.0–34.0)
MCHC: 32.3 g/dL (ref 30.0–36.0)
MCV: 90 fL (ref 80.0–100.0)
Monocytes Absolute: 0.2 10*3/uL (ref 0.1–1.0)
Monocytes Relative: 4 %
Neutro Abs: 4.4 10*3/uL (ref 1.7–7.7)
Neutrophils Relative %: 69 %
Platelets: 149 10*3/uL — ABNORMAL LOW (ref 150–400)
RBC: 3.71 MIL/uL — ABNORMAL LOW (ref 3.87–5.11)
RDW: 14.7 % (ref 11.5–15.5)
WBC: 6.3 10*3/uL (ref 4.0–10.5)
nRBC: 0 % (ref 0.0–0.2)

## 2022-05-15 LAB — ECHOCARDIOGRAM COMPLETE
Area-P 1/2: 6.14 cm2
Calc EF: 17.8 %
Height: 65 in
MV M vel: 4.1 m/s
MV Peak grad: 67.2 mmHg
MV VTI: 1.63 cm2
Radius: 0.8 cm
S' Lateral: 6.5 cm
Single Plane A2C EF: 22.1 %
Single Plane A4C EF: 11.4 %
Weight: 2720 oz

## 2022-05-15 LAB — COMPREHENSIVE METABOLIC PANEL
ALT: 40 U/L (ref 0–44)
AST: 49 U/L — ABNORMAL HIGH (ref 15–41)
Albumin: 3.3 g/dL — ABNORMAL LOW (ref 3.5–5.0)
Alkaline Phosphatase: 124 U/L (ref 38–126)
Anion gap: 9 (ref 5–15)
BUN: 20 mg/dL (ref 6–20)
CO2: 23 mmol/L (ref 22–32)
Calcium: 8.2 mg/dL — ABNORMAL LOW (ref 8.9–10.3)
Chloride: 105 mmol/L (ref 98–111)
Creatinine, Ser: 1.19 mg/dL — ABNORMAL HIGH (ref 0.44–1.00)
GFR, Estimated: 55 mL/min — ABNORMAL LOW (ref >60)
Glucose, Bld: 255 mg/dL — ABNORMAL HIGH (ref 70–99)
Potassium: 3.6 mmol/L (ref 3.5–5.1)
Sodium: 137 mmol/L (ref 135–145)
Total Bilirubin: 1 mg/dL (ref 0.3–1.2)
Total Protein: 6 g/dL — ABNORMAL LOW (ref 6.5–8.1)

## 2022-05-15 LAB — TROPONIN I (HIGH SENSITIVITY)
Troponin I (High Sensitivity): 25 ng/L — ABNORMAL HIGH (ref <18)
Troponin I (High Sensitivity): 28 ng/L — ABNORMAL HIGH (ref <18)
Troponin I (High Sensitivity): 30 ng/L — ABNORMAL HIGH (ref <18)
Troponin I (High Sensitivity): 27 ng/L — ABNORMAL HIGH (ref <18)

## 2022-05-15 LAB — CREATININE, SERUM
Creatinine, Ser: 1.11 mg/dL — ABNORMAL HIGH (ref 0.44–1.00)
GFR, Estimated: 60 mL/min — ABNORMAL LOW (ref >60)

## 2022-05-15 LAB — D-DIMER, QUANTITATIVE: D-Dimer, Quant: 3.06 ug/mL-FEU — ABNORMAL HIGH (ref 0.00–0.50)

## 2022-05-15 LAB — CBG MONITORING, ED
Glucose-Capillary: 303 mg/dL — ABNORMAL HIGH (ref 70–99)
Glucose-Capillary: 122 mg/dL — ABNORMAL HIGH (ref 70–99)
Glucose-Capillary: 207 mg/dL — ABNORMAL HIGH (ref 70–99)
Glucose-Capillary: 181 mg/dL — ABNORMAL HIGH (ref 70–99)

## 2022-05-15 LAB — CBC
HCT: 33.8 % — ABNORMAL LOW (ref 36.0–46.0)
Hemoglobin: 10.8 g/dL — ABNORMAL LOW (ref 12.0–15.0)
MCH: 29.3 pg (ref 26.0–34.0)
MCHC: 32 g/dL (ref 30.0–36.0)
MCV: 91.8 fL (ref 80.0–100.0)
Platelets: 149 10*3/uL — ABNORMAL LOW (ref 150–400)
RBC: 3.68 MIL/uL — ABNORMAL LOW (ref 3.87–5.11)
RDW: 14.8 % (ref 11.5–15.5)
WBC: 8 10*3/uL (ref 4.0–10.5)
nRBC: 0 % (ref 0.0–0.2)

## 2022-05-15 LAB — BRAIN NATRIURETIC PEPTIDE: B Natriuretic Peptide: 1172.8 pg/mL — ABNORMAL HIGH (ref 0.0–100.0)

## 2022-05-15 LAB — HIV ANTIBODY (ROUTINE TESTING W REFLEX): HIV Screen 4th Generation wRfx: NONREACTIVE

## 2022-05-15 LAB — MAGNESIUM: Magnesium: 1.7 mg/dL (ref 1.7–2.4)

## 2022-05-15 MED ORDER — FUROSEMIDE 10 MG/ML IJ SOLN
80.0000 mg | Freq: Once | INTRAMUSCULAR | Status: AC
  Administered 2022-05-15: 80 mg via INTRAVENOUS
  Filled 2022-05-15: qty 8

## 2022-05-15 MED ORDER — IOHEXOL 350 MG/ML SOLN
75.0000 mL | Freq: Once | INTRAVENOUS | Status: AC | PRN
  Administered 2022-05-15: 75 mL via INTRAVENOUS

## 2022-05-15 MED ORDER — HYDROMORPHONE HCL 1 MG/ML IJ SOLN
0.5000 mg | INTRAMUSCULAR | Status: DC | PRN
  Administered 2022-05-15 – 2022-05-17 (×9): 0.5 mg via INTRAVENOUS
  Filled 2022-05-15: qty 0.5
  Filled 2022-05-15: qty 1
  Filled 2022-05-15 (×4): qty 0.5
  Filled 2022-05-15 (×2): qty 1
  Filled 2022-05-15 (×2): qty 0.5

## 2022-05-15 MED ORDER — OXYCODONE HCL 5 MG PO TABS
5.0000 mg | ORAL_TABLET | Freq: Four times a day (QID) | ORAL | Status: DC | PRN
  Administered 2022-05-15 – 2022-05-16 (×4): 5 mg via ORAL
  Filled 2022-05-15 (×4): qty 1

## 2022-05-15 MED ORDER — ASPIRIN 325 MG PO TABS
325.0000 mg | ORAL_TABLET | Freq: Every day | ORAL | Status: DC
  Administered 2022-05-15 – 2022-05-17 (×3): 325 mg via ORAL
  Filled 2022-05-15 (×3): qty 1

## 2022-05-15 MED ORDER — POLYETHYLENE GLYCOL 3350 17 G PO PACK
17.0000 g | PACK | Freq: Every day | ORAL | Status: DC | PRN
  Filled 2022-05-15: qty 1

## 2022-05-15 MED ORDER — INSULIN ASPART 100 UNIT/ML IJ SOLN
0.0000 [IU] | Freq: Three times a day (TID) | INTRAMUSCULAR | Status: DC
  Administered 2022-05-15: 1 [IU] via SUBCUTANEOUS
  Administered 2022-05-15: 3 [IU] via SUBCUTANEOUS
  Administered 2022-05-15: 7 [IU] via SUBCUTANEOUS
  Administered 2022-05-16: 2 [IU] via SUBCUTANEOUS
  Administered 2022-05-16: 5 [IU] via SUBCUTANEOUS
  Administered 2022-05-17: 2 [IU] via SUBCUTANEOUS

## 2022-05-15 MED ORDER — INSULIN ASPART 100 UNIT/ML IJ SOLN
0.0000 [IU] | Freq: Every day | INTRAMUSCULAR | Status: DC
  Administered 2022-05-17: 3 [IU] via SUBCUTANEOUS

## 2022-05-15 MED ORDER — OXYCODONE-ACETAMINOPHEN 5-325 MG PO TABS
1.0000 | ORAL_TABLET | Freq: Once | ORAL | Status: AC
  Administered 2022-05-15: 1 via ORAL
  Filled 2022-05-15: qty 1

## 2022-05-15 MED ORDER — FUROSEMIDE 10 MG/ML IJ SOLN
40.0000 mg | Freq: Every day | INTRAMUSCULAR | Status: DC
  Administered 2022-05-15: 40 mg via INTRAVENOUS
  Filled 2022-05-15: qty 4

## 2022-05-15 MED ORDER — IPRATROPIUM-ALBUTEROL 0.5-2.5 (3) MG/3ML IN SOLN
3.0000 mL | Freq: Once | RESPIRATORY_TRACT | Status: AC
  Administered 2022-05-15: 3 mL via RESPIRATORY_TRACT
  Filled 2022-05-15: qty 3

## 2022-05-15 MED ORDER — SODIUM CHLORIDE 0.9 % IV SOLN
6.2500 mg | Freq: Four times a day (QID) | INTRAVENOUS | Status: DC | PRN
  Filled 2022-05-15: qty 0.25

## 2022-05-15 MED ORDER — FUROSEMIDE 10 MG/ML IJ SOLN
60.0000 mg | Freq: Once | INTRAMUSCULAR | Status: AC
  Administered 2022-05-15: 60 mg via INTRAVENOUS
  Filled 2022-05-15: qty 6

## 2022-05-15 MED ORDER — HYDROMORPHONE HCL 1 MG/ML IJ SOLN
0.5000 mg | INTRAMUSCULAR | Status: AC
  Administered 2022-05-15: 0.5 mg via INTRAVENOUS
  Filled 2022-05-15: qty 1

## 2022-05-15 MED ORDER — NITROGLYCERIN 0.4 MG SL SUBL: 0.4000 mg | SUBLINGUAL_TABLET | SUBLINGUAL | Status: DC | PRN

## 2022-05-15 MED ORDER — PROCHLORPERAZINE EDISYLATE 10 MG/2ML IJ SOLN
5.0000 mg | Freq: Four times a day (QID) | INTRAMUSCULAR | Status: DC | PRN
  Administered 2022-05-15 – 2022-05-17 (×5): 5 mg via INTRAVENOUS
  Filled 2022-05-15: qty 2
  Filled 2022-05-15 (×4): qty 1
  Filled 2022-05-15: qty 2
  Filled 2022-05-15: qty 1

## 2022-05-15 MED ORDER — PERFLUTREN LIPID MICROSPHERE
1.0000 mL | INTRAVENOUS | Status: AC | PRN
  Administered 2022-05-15: 2 mL via INTRAVENOUS

## 2022-05-15 MED ORDER — SENNOSIDES-DOCUSATE SODIUM 8.6-50 MG PO TABS
1.0000 | ORAL_TABLET | Freq: Every day | ORAL | Status: DC
  Administered 2022-05-15 – 2022-05-17 (×2): 1 via ORAL
  Filled 2022-05-15 (×2): qty 1

## 2022-05-15 MED ORDER — ENOXAPARIN SODIUM 40 MG/0.4ML IJ SOSY
40.0000 mg | PREFILLED_SYRINGE | INTRAMUSCULAR | Status: DC
  Administered 2022-05-15 – 2022-05-17 (×3): 40 mg via SUBCUTANEOUS
  Filled 2022-05-15 (×3): qty 0.4

## 2022-05-15 NOTE — Progress Notes (Signed)
  Echocardiogram 2D Echocardiogram has been performed.  [name] 05/15/2022, 3:39 PM

## 2022-05-15 NOTE — Plan of Care (Signed)
  Problem: Education: Goal: Ability to describe self-care measures that may prevent or decrease complications (Diabetes Survival Skills Education) will improve Outcome: Progressing Goal: Individualized Educational Video(s) Outcome: Progressing   

## 2022-05-15 NOTE — ED Notes (Signed)
Returned from ct at this time ?

## 2022-05-15 NOTE — ED Notes (Signed)
ED provider at bedside.

## 2022-05-15 NOTE — H&P (Signed)
History and Physical  [name] [name] FXO:[id number] DOB: 10-05-1970 DOA: 05/15/2022  Referring physician: Salomon Fick  PCP: Patient, No Pcp Per  Outpatient Specialists: Cardiology. Patient coming from: Home.  Chief Complaint: Shortness of breath and chest pain.  HPI: [name] [name] is a 51 y.o. female with medical history significant for HFrEF 25%, non ischemic cardiomyopathy, ongoing tobacco use 1 pack/day, chronic back pain on chronic opioids, bipolar disorder, who presented to [facility] ED from home due to progressive shortness of breath and chest pain.  For the past 2 days she has had exertional dyspnea and increased work of breathing.  Today her symptoms acutely worsened and progressed to pleuritic chest pain worse with deep inspiration.  EMS was activated and she was brought into the ED for further evaluation.  In the ED, workup revealed acute on chronic HFrEF with elevated BNP greater than 1000, pulmonary edema on CT scan.  She was started on diuretics in the ED.  She has chest wall tenderness on exam.  EDP requested admission for further diuresing.  The patient was admitted by the hospitalist service, [facility].  ED Course: Tmax 98.9.  BP 126/70, pulse 87, respiratory 21, O2 saturation 95% on 2 L.  Lab studies remarkable for serum glucose 255, creatinine 1.19 with baseline creatinine 0.9, GFR 55, AST 49, troponin 25, 28.  Hemoglobin 10.8.  Platelet count 149.  Review of Systems: Review of systems as noted in the HPI. All other systems reviewed and are negative.   Past Medical History:  Diagnosis Date   Anxiety disorder    Asthma    Bipolar affective disorder ([place])    Depression    Diabetes mellitus    Migraine    Schizophrenia ([place])    Past Surgical History:  Procedure Laterality Date   KNEE ARTHROSCOPY     x 2   KNEE SURGERY Left    Incision made and knee cleaned out   TUBAL LIGATION      Social History:  reports that she has been smoking cigarettes. She has a 29.00 pack-year  smoking history. She has never used smokeless tobacco. She reports that she does not currently use alcohol. She reports that she does not use drugs.   Allergies  Allergen Reactions   Tramadol Nausea Only    "feeling like I'm on fire"   Wellbutrin [Bupropion] Other (See Comments)    rage   Nicotine Rash    Patient states she is allergic to the Nicotine patch and they cause her to break out in a rash.  She states she can smoke cigarettes.    Family History  Problem Relation Age of Onset   Bipolar disorder Mother    Hypertension Father    Diabetes Father       Prior to Admission medications   Medication Sig Start Date End Date Taking? Authorizing Provider  APPLE CIDER VINEGAR PO Take 1 capsule by mouth 3 (three) times daily with meals.    [provider]  blood glucose meter kit and supplies Dispense based on patient and insurance preference. Use up to four times daily as directed. (FOR ICD-10 E10.9, E11.9). 01/06/18   Eugenie Filler, MD  cephALEXin (KEFLEX) 500 MG capsule Take 1 capsule (500 mg total) by mouth 3 (three) times daily. 09/05/20   Veryl Speak, MD  Cholecalciferol (VITAMIN D3) 250 MCG (10000 UT) capsule Take 10,000 Units by mouth daily.    [provider]  Lactobacillus (PROBIOTIC ACIDOPHILUS) CAPS Take 1 capsule by mouth  daily.    [provider]  magnesium oxide (MAG-OX) 400 (241.3 Mg) MG tablet Take 1 tablet (400 mg total) by mouth 2 (two) times daily. 07/20/20   Pokhrel, Corrie Mckusick, MD  metFORMIN (GLUCOPHAGE) 1000 MG tablet Take 0.5 tablets (500 mg total) by mouth 2 (two) times daily. Patient taking differently: Take 1,000 mg by mouth 2 (two) times daily. 10/08/18   Andee Poles, MD  metoprolol succinate (TOPROL-XL) 25 MG 24 hr tablet Take 12.5 mg by mouth daily. 03/24/20   [provider]  Multiple Vitamin (MULTIVITAMIN WITH MINERALS) TABS tablet Take 1 tablet by mouth daily.    [provider]  risperiDONE (RISPERDAL)  0.5 MG tablet Take 1 tablet (0.5 mg total) by mouth 2 (two) times daily. 07/20/20   Pokhrel, Corrie Mckusick, MD  sertraline (ZOLOFT) 100 MG tablet Take 1.5 tablets (150 mg total) by mouth daily. 07/20/20   Pokhrel, Corrie Mckusick, MD  traZODone (DESYREL) 50 MG tablet Take 1 tablet (50 mg total) by mouth at bedtime as needed for sleep. 12/16/18   Antonieta Pert, MD  albuterol (PROVENTIL HFA;VENTOLIN HFA) 108 (90 Base) MCG/ACT inhaler Inhale 2 puffs into the lungs every 6 (six) hours as needed for wheezing or shortness of breath. Patient not taking: Reported on 12/13/2018 01/06/18 06/04/20  Eugenie Filler, MD  benztropine (COGENTIN) 1 MG tablet Take 1 tablet (1 mg total) by mouth 2 (two) times daily. 12/23/18 06/04/20  Johnn Hai, MD  insulin aspart (NOVOLOG) 100 UNIT/ML injection Inject 0-9 Units into the skin 3 (three) times daily with meals. Patient not taking: Reported on 06/03/2020 12/16/18 06/04/20  Antonieta Pert, MD  mirtazapine (REMERON) 15 MG tablet Take 1 tablet (15 mg total) by mouth at bedtime. Patient not taking: Reported on 06/03/2020 12/22/18 06/04/20  Johnn Hai, MD    Physical Exam: BP 126/70   Pulse 87   Temp 98.9 F (37.2 C) (Oral)   Resp (!) 21   Ht _0  (1.651 m)   Wt 77.1 kg   SpO2 95%   BMI 28.29 kg/m   General: 51 y.o. year-old female well developed well nourished in no acute distress.  Alert and oriented x3. Cardiovascular: Regular rate and rhythm with no rubs or gallops.  No thyromegaly or JVD noted.  Trace lower extremity edema. 2/4 pulses in all 4 extremities. Respiratory: Clear to auscultation with no wheezes or rales. Good inspiratory effort. Abdomen: Soft nontender nondistended with normal bowel sounds x4 quadrants. Muskuloskeletal: No cyanosis or clubbing.  Trace LE edema noted bilaterally Neuro: CN II-XII intact, strength, sensation, reflexes Skin: No ulcerative lesions noted or rashes Psychiatry: Judgement and insight appear normal. Mood is appropriate for condition and  setting          Labs on Admission:  Basic Metabolic Panel: Recent Labs  Lab 05/15/22 0218  NA 137  K 3.6  CL 105  CO2 23  GLUCOSE 255*  BUN 20  CREATININE 1.19*  CALCIUM 8.2*  MG 1.7   Liver Function Tests: Recent Labs  Lab 05/15/22 0218  AST 49*  ALT 40  ALKPHOS 124  BILITOT 1.0  PROT 6.0*  ALBUMIN 3.3*   No results for input(s): "LIPASE", "AMYLASE" in the last 168 hours. No results for input(s): "AMMONIA" in the last 168 hours. CBC: Recent Labs  Lab 05/15/22 0218  WBC 6.3  NEUTROABS 4.4  HGB 10.8*  HCT 33.4*  MCV 90.0  PLT 149*   Cardiac Enzymes: No results for input(s): "CKTOTAL", "CKMB", "CKMBINDEX", "TROPONINI" in the  last 168 hours.  BNP (last 3 results) Recent Labs    05/15/22 0218  BNP 1,172.8*    ProBNP (last 3 results) No results for input(s): "PROBNP" in the last 8760 hours.  CBG: No results for input(s): "GLUCAP" in the last 168 hours.  Radiological Exams on Admission: CT Angio Chest PE W and/or Wo Contrast  Result Date: 05/15/2022 CLINICAL DATA:  Pulmonary embolism suspected, high probability. Shortness of breath, chest pain, and pulmonary edema. EXAM: CT ANGIOGRAPHY CHEST WITH CONTRAST TECHNIQUE: Multidetector CT imaging of the chest was performed using the standard protocol during bolus administration of intravenous contrast. Multiplanar CT image reconstructions and MIPs were obtained to evaluate the vascular anatomy. RADIATION DOSE REDUCTION: This exam was performed according to the departmental dose-optimization program which includes automated exposure control, adjustment of the mA and/or kV according to patient size and/or use of iterative reconstruction technique. CONTRAST:  67m OMNIPAQUE IOHEXOL 350 MG/ML SOLN COMPARISON:  01/03/2018. FINDINGS: Cardiovascular: Heart is enlarged and there is no pericardial effusion. There is atherosclerotic calcification of the aorta without evidence of aneurysm. The pulmonary trunk is mildly  distended which may be associated with underlying pulmonary artery hypertension. No pulmonary embolism is identified. Mediastinum/Nodes: Prominent lymph nodes are noted in the mediastinum in the hilar regions bilaterally. No axillary lymphadenopathy. The thyroid gland, trachea, and esophagus are within normal limits. Lungs/Pleura: Hazy attenuation is present in the lungs bilaterally. No effusion or pneumothorax. Upper Abdomen: There is reflux of contrast into the inferior vena cava and hepatic veins, which may be associated with right heart failure. No acute abnormality. Musculoskeletal: Mild degenerative changes in the thoracic spine. No acute osseous abnormality. Review of the MIP images confirms the above findings. IMPRESSION: 1. No evidence of pulmonary embolism. 2. Hazy attenuation in the lungs bilaterally, possible edema or infiltrate. 3. Cardiomegaly. 4. Distended pulmonary trunk suggesting underlying pulmonary artery hypertension. 5. Reflux of contrast into the inferior vena cava and hepatic veins which may be associated with right heart failure. 6. Aortic atherosclerosis. Electronically Signed   By: LBrett FairyM.D.   On: 05/15/2022 04:54   DG Chest Portable 1 View  Result Date: 05/15/2022 CLINICAL DATA:  Six shortness of breath, hypoxia EXAM: PORTABLE CHEST 1 VIEW COMPARISON:  09/04/2020 FINDINGS: Cardiomegaly. No confluent airspace opacities, effusions or edema. No acute bony abnormality. IMPRESSION: Cardiomegaly.  No active disease. Electronically Signed   By: KRolm BaptiseM.D.   On: 05/15/2022 02:47    EKG: I independently viewed the EKG done and my findings are as followed: Sinus rhythm rate of 99.  Nonspecific ST-T changes.  QTc 498.  Assessment/Plan Present on Admission:  Acute on chronic systolic CHF (congestive heart failure) (HCC)  Principal Problem:   Acute on chronic systolic CHF (congestive heart failure) (HCC)  Acute on chronic HFrEF 25% BNP greater than 1000, pulmonary edema  on CT scan IV diuresis started in the ED, continue.  Replace electrolytes as indicated. Start strict I's and O's and daily weight Repeat 2D echo, completed with  Elevated troponin, suspect demand ischemia in the setting of hypoxia and acute CHF. Initial troponin 25, 28, trend. No evidence of acute ischemia on twelve-lead EKG Closely monitor on telemetry and follow 2D echo As needed analgesics  Type 2 diabetes with hyperglycemia Serum glucose on presentation 255. Obtain hemoglobin A1c Start insulin sliding scale.  Essential hypertension BPs are soft, hold off home oral antihypertensives for now. Closely monitor vital signs  Isolated elevated AST AST 49 Monitor for now  Chronic anxiety/depression Resume home regimen once home meds are reconciled  Chronic lower back pain Resume home pain regimen and bowel regimen    DVT prophylaxis: Subcu Lovenox daily  Code Status: Full code  Family Communication: None at bedside.  Disposition Plan: Admitted to telemetry cardiac unit  Consults called: None.  Admission status: Inpatient status.   Status is: Inpatient The patient requires at least 2 midnights for further evaluation and treatment of present condition.   Kayleen Memos MD [facility] Pager [phone]  If 7PM-7AM, please contact night-coverage [email or web address] Password [email or web address]  05/15/2022, 6:49 AM

## 2022-05-15 NOTE — ED Provider Notes (Signed)
[facility] EMERGENCY DEPARTMENT Provider Note   CSN: [id number] Arrival date & time: 05/15/22  0155     History  Chief Complaint  Patient presents with   Shortness of Breath    [name] [name] is a 51 y.o. female.  51 year old female with past medical history significant for CHF presents today for evaluation of progressive worsening shortness of breath over the past 2 days that significantly worsened tonight.  Reports chronic two-pillow orthopnea, however started using 3 pillows tonight.  Also endorses chest pain that is sharp and substernal for the past 2 days.  She states she has been relatively doing well since she was discharged on October 6.  Reports compliance with her home medications.  Reviewed her medications that she has at bedside.  However her medications did not include Lasix.   The history is provided by the patient. No language interpreter was used.       Home Medications Prior to Admission medications   Medication Sig Start Date End Date Taking? Authorizing Provider  APPLE CIDER VINEGAR PO Take 1 capsule by mouth 3 (three) times daily with meals.    [provider]  blood glucose meter kit and supplies Dispense based on patient and insurance preference. Use up to four times daily as directed. (FOR ICD-10 E10.9, E11.9). 01/06/18   Eugenie Filler, MD  cephALEXin (KEFLEX) 500 MG capsule Take 1 capsule (500 mg total) by mouth 3 (three) times daily. 09/05/20   Veryl Speak, MD  Cholecalciferol (VITAMIN D3) 250 MCG (10000 UT) capsule Take 10,000 Units by mouth daily.    [provider]  Lactobacillus (PROBIOTIC ACIDOPHILUS) CAPS Take 1 capsule by mouth daily.    [provider]  magnesium oxide (MAG-OX) 400 (241.3 Mg) MG tablet Take 1 tablet (400 mg total) by mouth 2 (two) times daily. 07/20/20   Pokhrel, Corrie Mckusick, MD  metFORMIN (GLUCOPHAGE) 1000 MG tablet Take 0.5 tablets (500 mg total) by mouth 2 (two) times daily. Patient  taking differently: Take 1,000 mg by mouth 2 (two) times daily. 10/08/18   Andee Poles, MD  metoprolol succinate (TOPROL-XL) 25 MG 24 hr tablet Take 12.5 mg by mouth daily. 03/24/20   [provider]  Multiple Vitamin (MULTIVITAMIN WITH MINERALS) TABS tablet Take 1 tablet by mouth daily.    [provider]  risperiDONE (RISPERDAL) 0.5 MG tablet Take 1 tablet (0.5 mg total) by mouth 2 (two) times daily. 07/20/20   Pokhrel, Corrie Mckusick, MD  sertraline (ZOLOFT) 100 MG tablet Take 1.5 tablets (150 mg total) by mouth daily. 07/20/20   Pokhrel, Corrie Mckusick, MD  traZODone (DESYREL) 50 MG tablet Take 1 tablet (50 mg total) by mouth at bedtime as needed for sleep. 12/16/18   Antonieta Pert, MD  albuterol (PROVENTIL HFA;VENTOLIN HFA) 108 (90 Base) MCG/ACT inhaler Inhale 2 puffs into the lungs every 6 (six) hours as needed for wheezing or shortness of breath. Patient not taking: Reported on 12/13/2018 01/06/18 06/04/20  Eugenie Filler, MD  benztropine (COGENTIN) 1 MG tablet Take 1 tablet (1 mg total) by mouth 2 (two) times daily. 12/23/18 06/04/20  Johnn Hai, MD  insulin aspart (NOVOLOG) 100 UNIT/ML injection Inject 0-9 Units into the skin 3 (three) times daily with meals. Patient not taking: Reported on 06/03/2020 12/16/18 06/04/20  Antonieta Pert, MD  mirtazapine (REMERON) 15 MG tablet Take 1 tablet (15 mg total) by mouth at bedtime. Patient not taking: Reported on 06/03/2020 12/22/18 06/04/20  Johnn Hai, MD  Allergies    Tramadol, Wellbutrin [bupropion], and Nicotine    Review of Systems   Review of Systems  Respiratory:  Positive for shortness of breath.   Cardiovascular:  Positive for chest pain. Negative for palpitations and leg swelling.  Gastrointestinal:  Negative for abdominal pain, nausea and vomiting.  Neurological:  Negative for light-headedness.  All other systems reviewed and are negative.   Physical Exam Updated Vital Signs BP 122/85   Pulse (!) 103   Temp 98.9 F (37.2  C) (Oral)   Resp 16   Ht _0  (1.651 m)   Wt 77.1 kg   SpO2 92%   BMI 28.29 kg/m  Physical Exam Vitals and nursing note reviewed.  Constitutional:      General: She is not in acute distress (Minimal increase in work of breathing, but no significant acute distress).    Appearance: Normal appearance. She is ill-appearing (Chronically ill-appearing).  HENT:     Head: Normocephalic and atraumatic.     Nose: Nose normal.  Eyes:     General: No scleral icterus.    Extraocular Movements: Extraocular movements intact.     Conjunctiva/sclera: Conjunctivae normal.  Cardiovascular:     Rate and Rhythm: Regular rhythm. Tachycardia present.     Pulses: Normal pulses.  Pulmonary:     Effort: Pulmonary effort is normal. No respiratory distress.     Breath sounds: Wheezing present.     Comments: Slight increase in work of breathing with conversational dyspnea.  Diffuse faint wheezing noted. Abdominal:     General: There is no distension.     Tenderness: There is no abdominal tenderness. There is no guarding.  Musculoskeletal:        General: Normal range of motion.     Cervical back: Normal range of motion.     Right lower leg: No edema.     Left lower leg: No edema.  Skin:    General: Skin is warm and dry.  Neurological:     General: No focal deficit present.     Mental Status: She is alert. Mental status is at baseline.     ED Results / Procedures / Treatments   Labs (all labs ordered are listed, but only abnormal results are displayed) Labs Reviewed  CBC WITH DIFFERENTIAL/PLATELET  COMPREHENSIVE METABOLIC PANEL  BRAIN NATRIURETIC PEPTIDE  D-DIMER, QUANTITATIVE  MAGNESIUM  TROPONIN I (HIGH SENSITIVITY)    EKG None  Radiology No results found.  Procedures Procedures    Medications Ordered in ED Medications - No data to display  ED Course/ Medical Decision Making/ A&P                           Medical Decision Making Amount and/or Complexity of Data  Reviewed Labs: ordered. Radiology: ordered.  Risk OTC drugs. Prescription drug management. Decision regarding hospitalization.   Medical Decision Making / ED Course   This patient presents to the ED for concern of shortness of breath, chest pain, this involves an extensive number of treatment options, and is a complaint that carries with it a high risk of complications and morbidity.  The differential diagnosis includes CHF exacerbation, ACS, PE, pneumonia  MDM: 51 year old female with past medical history significant as above presents today for evaluation of shortness of breath, chest pain.  Orthopneic on exam. reports compliance with Lasix.  Work-up so far reveals CBC without leukocytosis.  Hemoglobin of 10.8 slightly decreased from previous 11.8.  No signs  of obvious bleeding.  CMP with creatinine 1.19, glucose 255 otherwise unremarkable.  Troponin initially 25, increased to 28.  Overall flat.  Low suspicion for ACS.  EKG without acute ischemic changes.  D-dimer 3.06.  Will obtain CT PE study.  BNP of 1172.  Lasix 60 mg IVP given.  CT PE study negative.  Pulmonary edema noticed.  She is on 2 L supplemental nasal cannula O2.  Exam consistent with volume overload.  Will discuss with hospitalist for admission for diuresis.  Discussed with hospitalist will evaluate patient for admission.  Patient was ordered nitro for her chest pain which she refused.  Requesting her home narcotic pain medicine which she has received 1 dose of here already.    Lab Tests: -I ordered, reviewed, and interpreted labs.   The pertinent results include:   Labs Reviewed  CBC WITH DIFFERENTIAL/PLATELET - Abnormal; Notable for the following components:      Result Value   RBC 3.71 (*)    Hemoglobin 10.8 (*)    HCT 33.4 (*)    Platelets 149 (*)    All other components within normal limits  COMPREHENSIVE METABOLIC PANEL - Abnormal; Notable for the following components:   Glucose, Bld 255 (*)    Creatinine, Ser  1.19 (*)    Calcium 8.2 (*)    Total Protein 6.0 (*)    Albumin 3.3 (*)    AST 49 (*)    GFR, Estimated 55 (*)    All other components within normal limits  BRAIN NATRIURETIC PEPTIDE - Abnormal; Notable for the following components:   B Natriuretic Peptide 1,172.8 (*)    All other components within normal limits  D-DIMER, QUANTITATIVE - Abnormal; Notable for the following components:   D-Dimer, Quant 3.06 (*)    All other components within normal limits  TROPONIN I (HIGH SENSITIVITY) - Abnormal; Notable for the following components:   Troponin I (High Sensitivity) 25 (*)    All other components within normal limits  TROPONIN I (HIGH SENSITIVITY) - Abnormal; Notable for the following components:   Troponin I (High Sensitivity) 28 (*)    All other components within normal limits  MAGNESIUM      EKG  EKG Interpretation  Date/Time:  Wednesday May 15 2022 02:11:54 EST Ventricular Rate:  99 PR Interval:  171 QRS Duration: 110 QT Interval:  388 QTC Calculation: 498 R Axis:   11 Text Interpretation: Sinus rhythm Biatrial enlargement Left ventricular hypertrophy Borderline T abnormalities, lateral leads Borderline prolonged QT interval Confirmed by Merrily Pew [phone]) on 05/15/2022 3:03:20 AM         Imaging Studies ordered: I ordered imaging studies including chest x-ray, CT PE study I independently visualized and interpreted imaging. I agree with the radiologist interpretation  Care everywhere reviewed.  Recent echo from 01/07/2022 showed echo with EF of 20-25%.   Medicines ordered and prescription drug management: Meds ordered this encounter  Medications   nitroGLYCERIN (NITROSTAT) SL tablet 0.4 mg   aspirin tablet 325 mg   ipratropium-albuterol (DUONEB) 0.5-2.5 (3) MG/3ML nebulizer solution 3 mL   furosemide (LASIX) injection 60 mg   oxyCODONE-acetaminophen (PERCOCET/ROXICET) 5-325 MG per tablet 1 tablet   iohexol (OMNIPAQUE) 350 MG/ML injection 75 mL    -I  have reviewed the patients home medicines and have made adjustments as needed   Reevaluation: After the interventions noted above, I reevaluated the patient and found that they have :stayed the same  Co morbidities that complicate the patient evaluation  Past  Medical History:  Diagnosis Date   Anxiety disorder    Asthma    Bipolar affective disorder ([place])    Depression    Diabetes mellitus    Migraine    Schizophrenia ([place])       Dispostion: Patient discussed with hospitalist who will evaluate patient for admission.  Final Clinical Impression(s) / ED Diagnoses Final diagnoses:  Acute on chronic congestive heart failure, unspecified heart failure type [facility])    Rx / DC Orders ED Discharge Orders     None         Evlyn Courier, PA-C 05/15/22 2820    Merrily Pew, MD 05/15/22 2302

## 2022-05-15 NOTE — ED Notes (Signed)
Contacted lab and was advised they could add the CBC and creatine could be added but they need 1 gold and 1 green

## 2022-05-15 NOTE — Progress Notes (Signed)
   Heart Failure Stewardship Pharmacist Progress Note   PCP: Patient, No Pcp Per PCP-Cardiologist: None    HPI:  51 yo F with PMH of HFrEF, NICM, T2DM, superficial DVT in R upper extremity s/p Eliquis x 3 months, anxiety, depression, bipolar disorder, schizophrenia, and migraines.  Longstanding history of advanced heart failure followed by [facility]. First diagnosed with HF in 10/2020 with EF 30%. Underwent cath that showed normal coronaries without obstructive process.   Was hospitalized in April 2023 for acute heart failure exacerbation. Hospitalization prolonged with difficulty with diuresis, requiring intermittent inotropic support (both milrinone and then dobutamine). Advanced heart failure team consulted and not candidate for LVAD or transplant. Patient underwent RHC on 4/10 with findings of Fick CO 2.84 l/min, Fick CI 1.51 l/min/m2.   Has had subsequent admissions for HF in July and twice in September.  Presented to the ED on 12/6 with shortness of breath, LE edema, and orthopnea. CXR with cardiomegaly. CTA negative for PE, showed bilateral edema and possible R heart failure. ECHO pending.  Current HF Medications: Diuretic: furosemide 40 mg IV daily  Prior to admission HF Medications: Diuretic: furosemide 80 mg daily PRN Beta blocker: metoprolol XL 12.5 mg daily  Pertinent Lab Values: Serum creatinine 1.11, BUN 20, Potassium 3.6, Sodium 137, BNP 1172.8, Magnesium 1.7  Vital Signs: Weight: 170 lbs (admission weight: 170 lbs) Blood pressure: 90-100/60s  Heart rate: 70s  I/O: -1.8L today  Medication Assistance / Insurance Benefits Check: Does the patient have prescription insurance?  Yes Type of insurance plan: [place] Medicaid  Outpatient Pharmacy:  Prior to admission outpatient pharmacy: [facility] Is the patient willing to use [facility] TOC pharmacy at discharge? Yes Is the patient willing to transition their outpatient pharmacy to utilize a [facility] outpatient pharmacy?   Pending     Assessment: 1. Acute on chronic systolic CHF (LVEF 25%, repeat ECHO pending), due to NICM. NYHA class III symptoms. - Monitor diuresis, low threshold for recommending to increase to BID dosing. Strict I/Os and daily weights. Keep K>4 and Mag>2. - Hold PTA metoprolol XL 12.5 mg daily - Further GDMT limited by hypotension.  - Consider consulting advanced heart failure team given longstanding history of HFrEF, noncompliance, and difficulty diuresing during prior hospitalizations requiring inotropes.  - May need PICC and coox monitoring with possible inotropes    Plan: 1) Medication changes recommended at this time: - Check A1c with AM labs - KCl 40 mEq x 1 - Magnesium 2 g IV x 1 - Consult to AHF - PICC / coox monitoring +/- inotropes   2) Patient assistance: - None pending, has [place] Medicaid  3)  Education  - To be completed prior to discharge  Sharen Hones, PharmD, BCPS Heart Failure Stewardship Pharmacist Phone [phone]

## 2022-05-15 NOTE — Progress Notes (Signed)
At bedside for PICC consent. All questions answered and pt verbalized understanding of the procedure. Pt stated she wants to wait until the AM for placement. Current PIV functioning properly.

## 2022-05-15 NOTE — Progress Notes (Signed)
This is a pleasant 51 year old lady with history of systolic congestive heart failure, nonischemic cardiomyopathy and ongoing tobacco use and chronic back pain on chronic steroids bipolar disorder presented with shortness of breath and chest pain.  She was admitted with acute on chronic systolic congestive heart failure based on the CT chest.  Elevated D-dimer but ruled out of PE.  Patient seen and examined in the ED.  Still complains of chest pain.  Shortness of breath improving.  No other complaint.  Troponins only slightly elevated but flat indicative of likely demand ischemia but echo is pending.  On lung exam, has faint crackles.  Continue Lasix.  Slightly elevated creatinine.  Repeat labs in the morning.

## 2022-05-15 NOTE — ED Triage Notes (Signed)
BIB [facility] from sons house. C/o SOB and pulmonary edema, and chest pain. Hx of CHF for the last 2 day increase SOB worsening today and tonight now has CP with breathing movement. Hx of same and has had to have IV lasix. Was in hospital in Oct 2023 for same. They increased her lasix to 80mg . Lung sounds rales lower lobes bilat decreased upper. PIV 20ga lt hand, [place] 4lpm O2 not on O2 at baseline.

## 2022-05-15 NOTE — ED Notes (Signed)
Pt transported to ct at this time 

## 2022-05-15 NOTE — Consult Note (Signed)
ADVANCED HEART FAILURE CONSULT NOTE  Referring Physician: No ref. provider found  Primary Care: Patient, No Pcp Per Primary Cardiologist:  HPI: [name] [name] is a 51 y.o. female with nonischemic cardiomyopathy (LVEF 20%), severe MR, schizoaffective disorder that presented to the ER today with complaints of worsening shortness of breath over the past 1 week, orthopnea, PND and chest pain today. [name] [name] cardiac history dates back to spring of 2022 when she was originally diagnosed with HFrEF. She has previously been seen at [place] [facility] where she has multiple hospital admissions over the past 1 year. She was previously followed by the advanced heart failure team at [facility] where she was not deemed a candidate for advanced therapies due to ongoing smoking, psychosocial barriers, schizoaffective disorder and chronic pain disorder. In July 2023 she was asdmitted to the ICU there for cardiogenic shock requiring inotropes/pressors and aggressive diuresis. Hospice was suggested, however, patient states she is unable to receive hospice support because her daughter does not want nurses in their home.   Today in the ER, lab work notable for a BNP of 1172; on my exam patient visibly dyspneic with elevated JVP.    Past Medical History:  Diagnosis Date   Anxiety disorder    Asthma    Bipolar affective disorder ([place])    Depression    Diabetes mellitus    Migraine    Schizophrenia ([place])     Current Facility-Administered Medications  Medication Dose Route Frequency Provider Last Rate Last Admin   aspirin tablet 325 mg  325 mg Oral Daily Evlyn Courier, PA-C   325 mg at 05/15/22 0902   enoxaparin (LOVENOX) injection 40 mg  40 mg Subcutaneous Q24H Irene Pap N, DO   40 mg at 05/15/22 0731   furosemide (LASIX) injection 40 mg  40 mg Intravenous Daily Irene Pap N, DO   40 mg at 05/15/22 9798   HYDROmorphone (DILAUDID) injection 0.5 mg  0.5 mg Intravenous Q3H PRN Irene Pap  N, DO   0.5 mg at 05/15/22 1348   insulin aspart (novoLOG) injection 0-5 Units  0-5 Units Subcutaneous QHS Hall, Carole N, DO       insulin aspart (novoLOG) injection 0-9 Units  0-9 Units Subcutaneous TID WC Irene Pap N, DO   3 Units at 05/15/22 1733   nitroGLYCERIN (NITROSTAT) SL tablet 0.4 mg  0.4 mg Sublingual Q5 min PRN Evlyn Courier, PA-C       oxyCODONE (Oxy IR/ROXICODONE) immediate release tablet 5 mg  5 mg Oral Q6H PRN Irene Pap N, DO   5 mg at 05/15/22 0902   polyethylene glycol (MIRALAX / GLYCOLAX) packet 17 g  17 g Oral Daily PRN Irene Pap N, DO       prochlorperazine (COMPAZINE) injection 5 mg  5 mg Intravenous Q6H PRN Irene Pap N, DO   5 mg at 05/15/22 1559   promethazine (PHENERGAN) 6.25 mg in sodium chloride 0.9 % 50 mL IVPB  6.25 mg Intravenous Q6H PRN Hall, Carole N, DO       senna-docusate (Senokot-S) tablet 1 tablet  1 tablet Oral QHS Kayleen Memos, DO       Current Outpatient Medications  Medication Sig Dispense Refill   ADVAIR DISKUS 250-50 MCG/ACT AEPB Inhale 1 puff into the lungs in the morning and at bedtime.     atorvastatin (LIPITOR) 20 MG tablet Take 20 mg by mouth daily.     furosemide (LASIX) 40 MG tablet Take 80  mg by mouth daily as needed.     insulin glargine (LANTUS SOLOSTAR) 100 UNIT/ML Solostar Pen Inject 20 Units into the skin in the morning. Do not take if blood sugar is <100.     magnesium oxide (MAG-OX) 400 (241.3 Mg) MG tablet Take 1 tablet (400 mg total) by mouth 2 (two) times daily. 20 tablet 0   metoprolol succinate (TOPROL-XL) 25 MG 24 hr tablet Take 12.5 mg by mouth daily.     oxyCODONE-acetaminophen (PERCOCET) 10-325 MG tablet Take 1 tablet by mouth every 4 (four) hours as needed for pain.     potassium chloride SA (KLOR-CON M) 20 MEQ tablet Take 20 mEq by mouth daily.     sertraline (ZOLOFT) 100 MG tablet Take 1.5 tablets (150 mg total) by mouth daily. (Patient taking differently: Take 100 mg by mouth daily.)     APPLE CIDER VINEGAR PO  Take 1 capsule by mouth 3 (three) times daily with meals. (Patient not taking: Reported on 05/15/2022)     blood glucose meter kit and supplies Dispense based on patient and insurance preference. Use up to four times daily as directed. (FOR ICD-10 E10.9, E11.9). 1 each 0   cephALEXin (KEFLEX) 500 MG capsule Take 1 capsule (500 mg total) by mouth 3 (three) times daily. (Patient not taking: Reported on 05/15/2022) 21 capsule 0   Cholecalciferol (VITAMIN D3) 250 MCG (10000 UT) capsule Take 10,000 Units by mouth daily. (Patient not taking: Reported on 05/15/2022)     Lactobacillus (PROBIOTIC ACIDOPHILUS) CAPS Take 1 capsule by mouth daily. (Patient not taking: Reported on 05/15/2022)     metFORMIN (GLUCOPHAGE) 1000 MG tablet Take 0.5 tablets (500 mg total) by mouth 2 (two) times daily. (Patient not taking: Reported on 05/15/2022) 30 tablet 0   Multiple Vitamin (MULTIVITAMIN WITH MINERALS) TABS tablet Take 1 tablet by mouth daily. (Patient not taking: Reported on 05/15/2022)     risperiDONE (RISPERDAL) 0.5 MG tablet Take 1 tablet (0.5 mg total) by mouth 2 (two) times daily. (Patient not taking: Reported on 05/15/2022) 60 tablet 2   traZODone (DESYREL) 50 MG tablet Take 1 tablet (50 mg total) by mouth at bedtime as needed for sleep. (Patient not taking: Reported on 05/15/2022)      Allergies  Allergen Reactions   Tramadol Nausea Only    "feeling like I'm on fire"   Wellbutrin [Bupropion] Other (See Comments)    rage   Nicotine Rash    Patient states she is allergic to the Nicotine patch and they cause her to break out in a rash.  She states she can smoke cigarettes.      Social History   Socioeconomic History   Marital status: Divorced    Spouse name: Not on file   Number of children: Not on file   Years of education: Not on file   Highest education level: Not on file  Occupational History   Not on file  Tobacco Use   Smoking status: Every Day    Packs/day: 1.00    Years: 29.00    Total pack  years: 29.00    Types: Cigarettes   Smokeless tobacco: Never  Vaping Use   Vaping Use: Former  Substance and Sexual Activity   Alcohol use: Not Currently    Comment: occasionally   Drug use: No   Sexual activity: Yes    Birth control/protection: Surgical  Other Topics Concern   Not on file  Social History Narrative   Patient lives in [place] with  her 2 kids- 37 in 54 years old.   She has total 4 kids.   She is a single mom.         Social Determinants of Health   Financial Resource Strain: Not on file  Food Insecurity: Not on file  Transportation Needs: Not on file  Physical Activity: Not on file  Stress: Not on file  Social Connections: Not on file  Intimate Partner Violence: Not on file      Family History  Problem Relation Age of Onset   Bipolar disorder Mother    Hypertension Father    Diabetes Father     PHYSICAL EXAM: Vitals:   05/15/22 1700 05/15/22 1804  BP: 103/72 105/72  Pulse: 82 90  Resp: 16 20  Temp:    SpO2: 97% 97%   GENERAL: chronically ill appearing F; appears mildly distressed   NECK: Supple, No masses. Normal carotid upstrokes without bruits. No masses or thyromegaly.    CHEST: There are no chest wall deformities. There is no chest wall tenderness. Respirations are unlabored.  Lungs- b/l basilar crackles CARDIAC:  JVP: 9-10 cm H2O         Normal S1, S2  Normal rate with regular rhythm. 3/6 SEM 2/2 MR, rubs or gallops.  Pulses are 2+ and symmetrical in upper and lower extremities. 1+ edema.  ABDOMEN: Soft, non-tender, non-distended. There are no masses or hepatomegaly. There are normal bowel sounds.  EXTREMITIES: Warm and well perfused with no cyanosis, clubbing.  LYMPHATIC: No axillary or supraclavicular lymphadenopathy.  NEUROLOGIC: Patient is oriented x3 with no focal or lateralizing neurologic deficits.  PSYCH: Patients affect is appropriate, there is no evidence of anxiety or depression.  SKIN: Warm and dry; no lesions or wounds.    DATA REVIEW  ECG:NSR  ECHO: 05/15/22: LVEF < 20%, RV mildly reduced; severe MR.    ASSESSMENT & PLAN:  Acute on chronic decompensated systolic heart failure - NYHA III, Stage D HFrEF, nonischemic.  - As noted above, extensive evaluation at [facility]; patient not deemed a candidate for advanced therapies due to psychosocial barriers including continued tobacco use, lack of social support, possible schizoaffective disorder; was discharged on hospice, however, patient states daughter would not want nursing support in her home.  - On TTE today, LVEF severely reduced;  - IV lasix 69m x 1 - PICC line, CVP and CO-OX - Digoxin 0.12100m daily - Hold off BB - MAP 60-65 during my exam; will start afterload reduction tomorrow.  I had a lengthy discussion with the patient discussing support options that we could offer her in terms of transportation, etc.   Severe functional MR - Severe centrally directed MR on TTE - Plan to start afterload reduction tomorrow as able  - Would be good candidate for TEER; will follow up outpatient after medical optimization    Marquavis Hannen Advanced Heart Failure Mechanical Circulatory Support

## 2022-05-16 ENCOUNTER — Other Ambulatory Visit (HOSPITAL_COMMUNITY): Payer: Self-pay

## 2022-05-16 DIAGNOSIS — I5023 Acute on chronic systolic (congestive) heart failure: Secondary | ICD-10-CM | POA: Diagnosis not present

## 2022-05-16 LAB — COOXEMETRY PANEL
Carboxyhemoglobin: 1.2 % (ref 0.5–1.5)
Methemoglobin: <0.7 % (ref 0.0–1.5)
O2 Saturation: 39.7 %
Total hemoglobin: 13.3 g/dL (ref 12.0–16.0)

## 2022-05-16 LAB — GLUCOSE, CAPILLARY
Glucose-Capillary: 197 mg/dL — ABNORMAL HIGH (ref 70–99)
Glucose-Capillary: 285 mg/dL — ABNORMAL HIGH (ref 70–99)
Glucose-Capillary: 151 mg/dL — ABNORMAL HIGH (ref 70–99)
Glucose-Capillary: 264 mg/dL — ABNORMAL HIGH (ref 70–99)

## 2022-05-16 LAB — BASIC METABOLIC PANEL
Anion gap: 13 (ref 5–15)
BUN: 23 mg/dL — ABNORMAL HIGH (ref 6–20)
CO2: 26 mmol/L (ref 22–32)
Calcium: 8.6 mg/dL — ABNORMAL LOW (ref 8.9–10.3)
Chloride: 97 mmol/L — ABNORMAL LOW (ref 98–111)
Creatinine, Ser: 1.34 mg/dL — ABNORMAL HIGH (ref 0.44–1.00)
GFR, Estimated: 48 mL/min — ABNORMAL LOW (ref >60)
Glucose, Bld: 190 mg/dL — ABNORMAL HIGH (ref 70–99)
Potassium: 3.4 mmol/L — ABNORMAL LOW (ref 3.5–5.1)
Sodium: 136 mmol/L (ref 135–145)

## 2022-05-16 LAB — HEMOGLOBIN A1C
Hgb A1c MFr Bld: 8.1 % — ABNORMAL HIGH (ref 4.8–5.6)
Mean Plasma Glucose: 186 mg/dL

## 2022-05-16 LAB — MAGNESIUM: Magnesium: 1.8 mg/dL (ref 1.7–2.4)

## 2022-05-16 MED ORDER — OXYCODONE HCL 5 MG PO TABS
5.0000 mg | ORAL_TABLET | ORAL | Status: DC | PRN
  Administered 2022-05-17: 5 mg via ORAL
  Filled 2022-05-16: qty 1

## 2022-05-16 MED ORDER — POTASSIUM CHLORIDE CRYS ER 20 MEQ PO TBCR
40.0000 meq | EXTENDED_RELEASE_TABLET | Freq: Once | ORAL | Status: AC
  Administered 2022-05-16: 40 meq via ORAL
  Filled 2022-05-16: qty 2

## 2022-05-16 MED ORDER — IPRATROPIUM-ALBUTEROL 0.5-2.5 (3) MG/3ML IN SOLN
3.0000 mL | Freq: Four times a day (QID) | RESPIRATORY_TRACT | Status: DC
  Administered 2022-05-16 (×2): 3 mL via RESPIRATORY_TRACT
  Filled 2022-05-16 (×3): qty 3

## 2022-05-16 MED ORDER — SERTRALINE HCL 100 MG PO TABS
100.0000 mg | ORAL_TABLET | Freq: Every day | ORAL | Status: DC
  Administered 2022-05-16 – 2022-05-17 (×2): 100 mg via ORAL
  Filled 2022-05-16 (×2): qty 1

## 2022-05-16 MED ORDER — INSULIN GLARGINE-YFGN 100 UNIT/ML ~~LOC~~ SOLN
15.0000 [IU] | Freq: Every day | SUBCUTANEOUS | Status: DC
  Administered 2022-05-16 – 2022-05-17 (×2): 15 [IU] via SUBCUTANEOUS
  Filled 2022-05-16 (×2): qty 0.15

## 2022-05-16 MED ORDER — ATORVASTATIN CALCIUM 10 MG PO TABS
20.0000 mg | ORAL_TABLET | Freq: Every day | ORAL | Status: DC
  Administered 2022-05-16 – 2022-05-17 (×2): 20 mg via ORAL
  Filled 2022-05-16 (×2): qty 2

## 2022-05-16 MED ORDER — IPRATROPIUM-ALBUTEROL 0.5-2.5 (3) MG/3ML IN SOLN
3.0000 mL | Freq: Four times a day (QID) | RESPIRATORY_TRACT | Status: DC
  Administered 2022-05-17: 3 mL via RESPIRATORY_TRACT
  Filled 2022-05-16: qty 3

## 2022-05-16 MED ORDER — OXYCODONE-ACETAMINOPHEN 10-325 MG PO TABS: 1.0000 | ORAL_TABLET | ORAL | Status: DC | PRN

## 2022-05-16 MED ORDER — SODIUM CHLORIDE 0.9% FLUSH
10.0000 mL | Freq: Two times a day (BID) | INTRAVENOUS | Status: DC
  Administered 2022-05-16: 20 mL
  Administered 2022-05-17 (×2): 10 mL

## 2022-05-16 MED ORDER — DIGOXIN 125 MCG PO TABS
0.1250 mg | ORAL_TABLET | Freq: Every day | ORAL | Status: DC
  Administered 2022-05-16 – 2022-05-17 (×2): 0.125 mg via ORAL
  Filled 2022-05-16 (×2): qty 1

## 2022-05-16 MED ORDER — MOMETASONE FURO-FORMOTEROL FUM 200-5 MCG/ACT IN AERO
2.0000 | INHALATION_SPRAY | Freq: Two times a day (BID) | RESPIRATORY_TRACT | Status: DC
  Administered 2022-05-16 – 2022-05-17 (×3): 2 via RESPIRATORY_TRACT
  Filled 2022-05-16: qty 8.8

## 2022-05-16 MED ORDER — SODIUM CHLORIDE 0.9% FLUSH: 10.0000 mL | INTRAVENOUS | Status: DC | PRN

## 2022-05-16 MED ORDER — OXYCODONE-ACETAMINOPHEN 5-325 MG PO TABS
1.0000 | ORAL_TABLET | ORAL | Status: DC | PRN
  Administered 2022-05-16: 1 via ORAL
  Filled 2022-05-16: qty 1

## 2022-05-16 MED ORDER — CHLORHEXIDINE GLUCONATE CLOTH 2 % EX PADS
6.0000 | MEDICATED_PAD | Freq: Every day | CUTANEOUS | Status: DC
  Administered 2022-05-16 – 2022-05-17 (×2): 6 via TOPICAL

## 2022-05-16 MED ORDER — MAGNESIUM SULFATE 2 GM/50ML IV SOLN
2.0000 g | Freq: Once | INTRAVENOUS | Status: AC
  Administered 2022-05-16: 2 g via INTRAVENOUS
  Filled 2022-05-16: qty 50

## 2022-05-16 NOTE — Progress Notes (Signed)
PROGRESS NOTE    [name] [name]  QMV:[id number] DOB: 09/13/1970 DOA: 05/15/2022 PCP: Patient, No Pcp Per   Brief Narrative:  HPI: [name] [name] is a 51 y.o. female with medical history significant for HFrEF 25%, non ischemic cardiomyopathy, ongoing tobacco use 1 pack/day, chronic back pain on chronic opioids, bipolar disorder, who presented to [facility] ED from home due to progressive shortness of breath and chest pain.  For the past 2 days she has had exertional dyspnea and increased work of breathing.  Today her symptoms acutely worsened and progressed to pleuritic chest pain worse with deep inspiration.  EMS was activated and she was brought into the ED for further evaluation.   In the ED, workup revealed acute on chronic HFrEF with elevated BNP greater than 1000, pulmonary edema on CT scan.  She was started on diuretics in the ED.  She has chest wall tenderness on exam.  EDP requested admission for further diuresing.  The patient was admitted by the hospitalist service, [facility].   ED Course: Tmax 98.9.  BP 126/70, pulse 87, respiratory 21, O2 saturation 95% on 2 L.  Lab studies remarkable for serum glucose 255, creatinine 1.19 with baseline creatinine 0.9, GFR 55, AST 49, troponin 25, 28.  Hemoglobin 10.8.  Platelet count 149.  Assessment & Plan:   Principal Problem:   Acute on chronic systolic CHF (congestive heart failure) (HCC)  Acute on chronic systolic congestive heart failure: Previously EF was 25%.  Echo at this time shows further reduced EF of less than 20%.  Global hypokinesis.  Grade 3 diastolic dysfunction.  Presented with BNP greater than thousand, pulmonary edema on CT chest.  Patient feels better as far as her breathing goes but she is dizzy at times.  She is not hypoxic.  She received 1 dose of Lasix IV 80 mg yesterday.  Cardiology/heart failure team is on board and managing the care.  No diuresis today per them.  She underwent PICC line placement, Co. ox pending.  Beta-blocker on hold  per cardiology.  Elevated troponin: Slightly elevated troponin but flat, consistent with demand ischemia.  Type 2 diabetes mellitus: Previous hemoglobin A1c 7.4.  Repeat A1c pending.  Takes 20 units of Lantus at home.  Hyperglycemic here.  Will initiate on 15 units and continue SSI.  AKI: Previous creatinine around 0.9, presented with 1.19, jumped to 1.34 today.  She is not on any nephrotoxic agent.  COPD: She is wheezy.  She cannot tell me if that is her baseline.  She continues to smoke.  I will start her on DuoNeb every 6 hours scheduled and reassess tomorrow.  Hypokalemia: Replace.  Hyperlipidemia: Resume atorvastatin.  Essential hypertension: Blood pressure soft.  Continue to hold antihypertensives.  Chronic anxiety/depression/possible schizophrenia: Continue home medications/Zoloft.  Chronic low back pain: Continue home medications.  DVT prophylaxis: enoxaparin (LOVENOX) injection 40 mg Start: 05/15/22 0800   Code Status: Full Code  Family Communication:  None present at bedside.  Plan of care discussed with patient in length and he/she verbalized understanding and agreed with it.  Status is: Inpatient Remains inpatient appropriate because: Still symptomatic.   Estimated body mass index is 28 kg/m as calculated from the following:   Height as of this encounter:  (1.676 m).   Weight as of this encounter: 78.7 kg.    Nutritional Assessment: Body mass index is 28 kg/m.Marland Kitchen Seen by dietician.  I agree with the assessment and plan as outlined below: Nutrition Status:        .  Skin Assessment: I have examined the patient's skin and I agree with the wound assessment as performed by the wound care RN as outlined below:    Consultants:  Cardiology  Procedures:  None  Antimicrobials:  Anti-infectives (From admission, onward)    None         Subjective: Patient seen and examined.  She states that her breathing is better.  She feels dizzy at times.  No  other complaint.  Objective: Vitals:   05/15/22 2215 05/15/22 2306 05/16/22 0348 05/16/22 0800  BP: 93/69 95/68 (!) 92/50 (!) 88/61  Pulse: 82 81  83  Resp: 14 18  18   Temp:  97.8 F (36.6 C) 97.8 F (36.6 C) 98.9 F (37.2 C)  TempSrc:  Oral Oral Oral  SpO2: 97% 99%  98%  Weight:  78.7 kg 78.7 kg   Height:  5\' 6"  (1.676 m)      Intake/Output Summary (Last 24 hours) at 05/16/2022 1132 Last data filed at 05/16/2022 0800 Gross per 24 hour  Intake 477 ml  Output 1250 ml  Net -773 ml   Filed Weights   05/15/22 0202 05/15/22 2306 05/16/22 0348  Weight: 77.1 kg 78.7 kg 78.7 kg    Examination:  General exam: Appears calm and comfortable  Respiratory system: Scattered expiratory wheezes bilaterally.  Respiratory effort normal. Cardiovascular system: S1 & S2 heard, RRR. No JVD, murmurs, rubs, gallops or clicks. No pedal edema. Gastrointestinal system: Abdomen is nondistended, soft and nontender. No organomegaly or masses felt. Normal bowel sounds heard. Central nervous system: Alert and oriented. No focal neurological deficits. Extremities: Symmetric 5 x 5 power. Skin: No rashes, lesions or ulcers Psychiatry: Judgement and insight appear normal. Mood & affect appropriate.    Data Reviewed: I have personally reviewed following labs and imaging studies  CBC: Recent Labs  Lab 05/15/22 0218 05/15/22 0854  WBC 6.3 8.0  NEUTROABS 4.4  --   HGB 10.8* 10.8*  HCT 33.4* 33.8*  MCV 90.0 91.8  PLT 149* 149*   Basic Metabolic Panel: Recent Labs  Lab 05/15/22 0218 05/15/22 0854 05/16/22 0110  NA 137  --  136  K 3.6  --  3.4*  CL 105  --  97*  CO2 23  --  26  GLUCOSE 255*  --  190*  BUN 20  --  23*  CREATININE 1.19* 1.11* 1.34*  CALCIUM 8.2*  --  8.6*  MG 1.7  --  1.8   GFR: Estimated Creatinine Clearance: 52 mL/min (A) (by C-G formula based on SCr of 1.34 mg/dL (H)). Liver Function Tests: Recent Labs  Lab 05/15/22 0218  AST 49*  ALT 40  ALKPHOS 124  BILITOT 1.0   PROT 6.0*  ALBUMIN 3.3*   No results for input(s): "LIPASE", "AMYLASE" in the last 168 hours. No results for input(s): "AMMONIA" in the last 168 hours. Coagulation Profile: No results for input(s): "INR", "PROTIME" in the last 168 hours. Cardiac Enzymes: No results for input(s): "CKTOTAL", "CKMB", "CKMBINDEX", "TROPONINI" in the last 168 hours. BNP (last 3 results) No results for input(s): "PROBNP" in the last 8760 hours. HbA1C: No results for input(s): "HGBA1C" in the last 72 hours. CBG: Recent Labs  Lab 05/15/22 0742 05/15/22 1115 05/15/22 1700 05/15/22 2218 05/16/22 0606  GLUCAP 303* 122* 207* 181* 197*   Lipid Profile: No results for input(s): "CHOL", "HDL", "LDLCALC", "TRIG", "CHOLHDL", "LDLDIRECT" in the last 72 hours. Thyroid Function Tests: No results for input(s): "TSH", "T4TOTAL", "FREET4", "T3FREE", "THYROIDAB" in the  last 72 hours. Anemia Panel: No results for input(s): "VITAMINB12", "FOLATE", "FERRITIN", "TIBC", "IRON", "RETICCTPCT" in the last 72 hours. Sepsis Labs: No results for input(s): "PROCALCITON", "LATICACIDVEN" in the last 168 hours.  No results found for this or any previous visit (from the past 240 hour(s)).   Radiology Studies: [place] EKG SITE RITE  Result Date: 05/15/2022 If Site Rite image not attached, placement could not be confirmed due to current cardiac rhythm.  ECHOCARDIOGRAM COMPLETE  Result Date: 05/15/2022    ECHOCARDIOGRAM REPORT   Patient Name:   BROOKLEE MICHELIN Date of Exam: 05/15/2022 Medical Rec #:  [id number]       Height:       65.0 in Accession #:    [id number]      Weight:       170.0 lb Date of Birth:  06-24-1970       BSA:          1.846 m Patient Age:    51 years        BP:           106/72 mmHg Patient Gender: F               HR:           74 bpm. Exam Location:  Inpatient Procedure: 2D Echo, Cardiac Doppler, Color Doppler and Intracardiac            Opacification Agent Indications:    I50.40* Unspecified combined systolic  (congestive) and diastolic                 (congestive) heart failure; R07.9* Chest pain, unspecified  History:        Patient has no prior history of Echocardiogram examinations.                 CHF, Signs/Symptoms:Shortness of Breath, Dyspnea and Chest Pain;                 Risk Factors:Hypertension, Diabetes and Current Smoker.  Sonographer:    Sheralyn Boatman RDCS Referring Phys: [id number] CAROLE N HALL IMPRESSIONS  1. Left ventricular ejection fraction, by estimation, is <20%. The left ventricle has severely decreased function. The left ventricle demonstrates global hypokinesis. The left ventricular internal cavity size was moderately to severely dilated. Left ventricular diastolic parameters are consistent with Grade III diastolic dysfunction (restrictive). No LV thrombus visualized.  2. Right ventricular systolic function is mildly reduced. The right ventricular size is normal. There is normal pulmonary artery systolic pressure. The estimated right ventricular systolic pressure is 30.7 mmHg.  3. Left atrial size was moderately dilated.  4. The mitral valve is abnormal. Moderate to severe central mitral valve regurgitation, PISA ERO 0.35 cm^2 (visually MR looks severe). Suspect functional MR with dilated LV. No evidence of mitral stenosis.  5. The aortic valve is tricuspid. Aortic valve regurgitation is not visualized. No aortic stenosis is present.  6. The inferior vena cava is dilated in size with <50% respiratory variability, suggesting right atrial pressure of 15 mmHg. FINDINGS  Left Ventricle: Left ventricular ejection fraction, by estimation, is <20%. The left ventricle has severely decreased function. The left ventricle demonstrates global hypokinesis. The left ventricular internal cavity size was moderately to severely dilated. There is no left ventricular hypertrophy. Left ventricular diastolic parameters are consistent with Grade III diastolic dysfunction (restrictive). Right Ventricle: The right ventricular  size is normal. No increase in right ventricular wall thickness. Right ventricular systolic function is mildly reduced. There is  normal pulmonary artery systolic pressure. The tricuspid regurgitant velocity is 1.98 m/s, and with an assumed right atrial pressure of 15 mmHg, the estimated right ventricular systolic pressure is 30.7 mmHg. Left Atrium: Left atrial size was moderately dilated. Right Atrium: Right atrial size was normal in size. Pericardium: There is no evidence of pericardial effusion. Mitral Valve: The mitral valve is abnormal. Moderate to severe mitral valve regurgitation. No evidence of mitral valve stenosis. MV peak gradient, 6.6 mmHg. The mean mitral valve gradient is 2.0 mmHg. Tricuspid Valve: The tricuspid valve is normal in structure. Tricuspid valve regurgitation is trivial. Aortic Valve: The aortic valve is tricuspid. Aortic valve regurgitation is not visualized. No aortic stenosis is present. Pulmonic Valve: The pulmonic valve was normal in structure. Pulmonic valve regurgitation is trivial. Aorta: The aortic root is normal in size and structure. Venous: The inferior vena cava is dilated in size with less than 50% respiratory variability, suggesting right atrial pressure of 15 mmHg. IAS/Shunts: No atrial level shunt detected by color flow Doppler.  LEFT VENTRICLE PLAX 2D LVIDd:         7.00 cm      Diastology LVIDs:         6.50 cm      LV e' medial:    3.83 cm/s LV PW:         0.90 cm      LV E/e' medial:  32.5 LV IVS:        0.90 cm      LV e' lateral:   5.70 cm/s LVOT diam:     2.20 cm      LV E/e' lateral: 21.8 LV SV:         43 LV SV Index:   23 LVOT Area:     3.80 cm  LV Volumes (MOD) LV vol d, MOD A2C: 326.0 ml LV vol d, MOD A4C: 293.5 ml LV vol s, MOD A2C: 254.0 ml LV vol s, MOD A4C: 260.0 ml LV SV MOD A2C:     72.0 ml LV SV MOD A4C:     293.5 ml LV SV MOD BP:      56.2 ml RIGHT VENTRICLE             IVC RV S prime:     10.70 cm/s  IVC diam: 2.20 cm TAPSE (M-mode): 1.5 cm LEFT ATRIUM              Index        RIGHT ATRIUM           Index LA diam:        5.30 cm 2.87 cm/m   RA Area:     16.00 cm LA Vol (A2C):   94.5 ml 51.19 ml/m  RA Volume:   41.40 ml  22.43 ml/m LA Vol (A4C):   70.7 ml 38.30 ml/m LA Biplane Vol: 86.0 ml 46.58 ml/m  AORTIC VALVE             PULMONIC VALVE LVOT Vmax:   68.30 cm/s  PR End Diast Vel: 1.73 msec LVOT Vmean:  41.900 cm/s LVOT VTI:    0.114 m  AORTA Ao Root diam: 3.40 cm Ao Asc diam:  3.00 cm MITRAL VALVE                  TRICUSPID VALVE MV Area (PHT): 6.14 cm       TR Peak grad:   15.7 mmHg MV Area VTI:   1.63 cm  TR Vmax:        198.00 cm/s MV Peak grad:  6.6 mmHg MV Mean grad:  2.0 mmHg       SHUNTS MV Vmax:       1.28 m/s       Systemic VTI:  0.11 m MV Vmean:      71.3 cm/s      Systemic Diam: 2.20 cm MV Decel Time: 124 msec MR Peak grad:    67.2 mmHg MR Mean grad:    41.0 mmHg MR Vmax:         410.00 cm/s MR Vmean:        296.0 cm/s MR PISA:         4.02 cm MR PISA Eff ROA: 35 mm MR PISA Radius:  0.80 cm MV E velocity: 124.50 cm/s MV A velocity: 47.75 cm/s MV E/A ratio:  2.61 Dalton McleanMD Electronically signed by Wilfred Lacy Signature Date/Time: 05/15/2022/4:55:31 PM    Final    CT Angio Chest PE W and/or Wo Contrast  Result Date: 05/15/2022 CLINICAL DATA:  Pulmonary embolism suspected, high probability. Shortness of breath, chest pain, and pulmonary edema. EXAM: CT ANGIOGRAPHY CHEST WITH CONTRAST TECHNIQUE: Multidetector CT imaging of the chest was performed using the standard protocol during bolus administration of intravenous contrast. Multiplanar CT image reconstructions and MIPs were obtained to evaluate the vascular anatomy. RADIATION DOSE REDUCTION: This exam was performed according to the departmental dose-optimization program which includes automated exposure control, adjustment of the mA and/or kV according to patient size and/or use of iterative reconstruction technique. CONTRAST:  31mL OMNIPAQUE IOHEXOL 350 MG/ML SOLN COMPARISON:   01/03/2018. FINDINGS: Cardiovascular: Heart is enlarged and there is no pericardial effusion. There is atherosclerotic calcification of the aorta without evidence of aneurysm. The pulmonary trunk is mildly distended which may be associated with underlying pulmonary artery hypertension. No pulmonary embolism is identified. Mediastinum/Nodes: Prominent lymph nodes are noted in the mediastinum in the hilar regions bilaterally. No axillary lymphadenopathy. The thyroid gland, trachea, and esophagus are within normal limits. Lungs/Pleura: Hazy attenuation is present in the lungs bilaterally. No effusion or pneumothorax. Upper Abdomen: There is reflux of contrast into the inferior vena cava and hepatic veins, which may be associated with right heart failure. No acute abnormality. Musculoskeletal: Mild degenerative changes in the thoracic spine. No acute osseous abnormality. Review of the MIP images confirms the above findings. IMPRESSION: 1. No evidence of pulmonary embolism. 2. Hazy attenuation in the lungs bilaterally, possible edema or infiltrate. 3. Cardiomegaly. 4. Distended pulmonary trunk suggesting underlying pulmonary artery hypertension. 5. Reflux of contrast into the inferior vena cava and hepatic veins which may be associated with right heart failure. 6. Aortic atherosclerosis. Electronically Signed   By: Thornell Sartorius M.D.   On: 05/15/2022 04:54   DG Chest Portable 1 View  Result Date: 05/15/2022 CLINICAL DATA:  Six shortness of breath, hypoxia EXAM: PORTABLE CHEST 1 VIEW COMPARISON:  09/04/2020 FINDINGS: Cardiomegaly. No confluent airspace opacities, effusions or edema. No acute bony abnormality. IMPRESSION: Cardiomegaly.  No active disease. Electronically Signed   By: Charlett Nose M.D.   On: 05/15/2022 02:47    Scheduled Meds:  aspirin  325 mg Oral Daily   Chlorhexidine Gluconate Cloth  6 each Topical Daily   enoxaparin (LOVENOX) injection  40 mg Subcutaneous Q24H   insulin aspart  0-5 Units  Subcutaneous QHS   insulin aspart  0-9 Units Subcutaneous TID WC   insulin glargine-yfgn  15 Units Subcutaneous Daily  ipratropium-albuterol  3 mL Nebulization Q6H   senna-docusate  1 tablet Oral QHS   sodium chloride flush  10-40 mL Intracatheter Q12H   Continuous Infusions:  promethazine (PHENERGAN) injection (IM or IVPB)       LOS: 1 day   Hughie Closs, MD [facility]  05/16/2022, 11:32 AM   *Please note that this is a verbal dictation therefore any spelling or grammatical errors are due to the "Dragon Medical One" system interpretation.  Please page via Amion and do not message via secure chat for urgent patient care matters. Secure chat can be used for non urgent patient care matters.  How to contact the [facility] Attending or Consulting provider 7A - 7P or covering provider during after hours 7P -7A, for this patient?  Check the care team in [facility] and look for a) attending/consulting [facility] provider listed and b) the [facility] team listed. Page or secure chat 7A-7P. Log into [email or web address] and use [place] universal password to access. If you do not have the password, please contact the hospital operator. Locate the [facility] provider you are looking for under [facility] and page to a number that you can be directly reached. If you still have difficulty reaching the provider, please page the [facility] (Director on Call) for the Hospitalists listed on amion for assistance.

## 2022-05-16 NOTE — TOC Benefit Eligibility Note (Signed)
Patient Product/process development scientist completed.    The patient is currently admitted and upon discharge could be taking Jardiance 10 mg.  The current 30 day co-pay is $4.00.   The patient is currently admitted and upon discharge could be taking Farxiga 10 mg.  Requires Prior Authorization  The patient is insured through [facility] Medicaid   Roland Earl, CPHT Pharmacy Patient Advocate Specialist [facility] Patient Advocate Team Direct Number: [phone]  Fax: [phone]

## 2022-05-16 NOTE — Progress Notes (Addendum)
Advanced Heart Failure Rounding Note  PCP-Cardiologist: None   Subjective:   TTE 12/6, LVEF severely reduced   CVP set up and checked, 7  Dizzy when she sits up and hypotensive.   Denies CP. Breathing stable, SOB with activity.   Objective:   Weight Range: 78.7 kg Body mass index is 28 kg/m.   Vital Signs:   Temp:  [97.8 F (36.6 C)-98.9 F (37.2 C)] 98.9 F (37.2 C) (12/07 0800) Pulse Rate:  [69-97] 83 (12/07 0800) Resp:  [14-25] 18 (12/07 0800) BP: (88-110)/(50-77) 88/61 (12/07 0800) SpO2:  [94 %-99 %] 98 % (12/07 0800) Weight:  [78.7 kg] 78.7 kg (12/07 0348) Last BM Date : 05/13/22  Weight change: Filed Weights   05/15/22 0202 05/15/22 2306 05/16/22 0348  Weight: 77.1 kg 78.7 kg 78.7 kg    Intake/Output:   Intake/Output Summary (Last 24 hours) at 05/16/2022 1006 Last data filed at 05/16/2022 0800 Gross per 24 hour  Intake 477 ml  Output 1250 ml  Net -773 ml      Physical Exam    General:  Well appearing. No resp difficulty HEENT: Normal Neck: Supple. No JVP. Carotids 2+ bilat; no bruits. No lymphadenopathy or thyromegaly appreciated. Cor: PMI nondisplaced. Regular rate & rhythm. No rubs, gallops or murmurs. Lungs: Clear Abdomen: Soft, nontender, nondistended. No hepatosplenomegaly. No bruits or masses. Good bowel sounds. Extremities: No cyanosis, clubbing, rash, edema. PICC RUE Neuro: Alert & orientedx3, cranial nerves grossly intact. moves all 4 extremities w/o difficulty. Affect pleasant  Telemetry   NSR 80s  EKG    No new EKG to review  Labs    CBC Recent Labs    05/15/22 0218 05/15/22 0854  WBC 6.3 8.0  NEUTROABS 4.4  --   HGB 10.8* 10.8*  HCT 33.4* 33.8*  MCV 90.0 91.8  PLT 149* 149*   Basic Metabolic Panel Recent Labs    47/82/95 0218 05/15/22 0854 05/16/22 0110  NA 137  --  136  K 3.6  --  3.4*  CL 105  --  97*  CO2 23  --  26  GLUCOSE 255*  --  190*  BUN 20  --  23*  CREATININE 1.19* 1.11* 1.34*  CALCIUM 8.2*   --  8.6*  MG 1.7  --  1.8   Liver Function Tests Recent Labs    05/15/22 0218  AST 49*  ALT 40  ALKPHOS 124  BILITOT 1.0  PROT 6.0*  ALBUMIN 3.3*   No results for input(s): "LIPASE", "AMYLASE" in the last 72 hours. Cardiac Enzymes No results for input(s): "CKTOTAL", "CKMB", "CKMBINDEX", "TROPONINI" in the last 72 hours.  BNP: BNP (last 3 results) Recent Labs    05/15/22 0218  BNP 1,172.8*    ProBNP (last 3 results) No results for input(s): "PROBNP" in the last 8760 hours.   D-Dimer Recent Labs    05/15/22 0218  DDIMER 3.06*   Hemoglobin A1C No results for input(s): "HGBA1C" in the last 72 hours. Fasting Lipid Panel No results for input(s): "CHOL", "HDL", "LDLCALC", "TRIG", "CHOLHDL", "LDLDIRECT" in the last 72 hours. Thyroid Function Tests No results for input(s): "TSH", "T4TOTAL", "T3FREE", "THYROIDAB" in the last 72 hours.  Invalid input(s): "FREET3"  Other results:   Imaging    [place] EKG SITE RITE  Result Date: 05/15/2022 If Site Rite image not attached, placement could not be confirmed due to current cardiac rhythm.  ECHOCARDIOGRAM COMPLETE  Result Date: 05/15/2022    ECHOCARDIOGRAM REPORT  Patient Name:   [name] [name] Date of Exam: 05/15/2022 Medical Rec #:  [id number]       Height:       65.0 in Accession #:    [id number]      Weight:       170.0 lb Date of Birth:  [birth year]       BSA:          1.846 m Patient Age:    51 years        BP:           106/72 mmHg Patient Gender: F               HR:           74 bpm. Exam Location:  Inpatient Procedure: 2D Echo, Cardiac Doppler, Color Doppler and Intracardiac            Opacification Agent Indications:    I50.40* Unspecified combined systolic (congestive) and diastolic                 (congestive) heart failure; R07.9* Chest pain, unspecified  History:        Patient has no prior history of Echocardiogram examinations.                 CHF, Signs/Symptoms:Shortness of Breath, Dyspnea and Chest Pain;                  Risk Factors:Hypertension, Diabetes and Current Smoker.  Sonographer:    Sheralyn Boatman RDCS Referring Phys: [id number] CAROLE N HALL IMPRESSIONS  1. Left ventricular ejection fraction, by estimation, is <20%. The left ventricle has severely decreased function. The left ventricle demonstrates global hypokinesis. The left ventricular internal cavity size was moderately to severely dilated. Left ventricular diastolic parameters are consistent with Grade III diastolic dysfunction (restrictive). No LV thrombus visualized.  2. Right ventricular systolic function is mildly reduced. The right ventricular size is normal. There is normal pulmonary artery systolic pressure. The estimated right ventricular systolic pressure is 30.7 mmHg.  3. Left atrial size was moderately dilated.  4. The mitral valve is abnormal. Moderate to severe central mitral valve regurgitation, PISA ERO 0.35 cm^2 (visually MR looks severe). Suspect functional MR with dilated LV. No evidence of mitral stenosis.  5. The aortic valve is tricuspid. Aortic valve regurgitation is not visualized. No aortic stenosis is present.  6. The inferior vena cava is dilated in size with <50% respiratory variability, suggesting right atrial pressure of 15 mmHg. FINDINGS  Left Ventricle: Left ventricular ejection fraction, by estimation, is <20%. The left ventricle has severely decreased function. The left ventricle demonstrates global hypokinesis. The left ventricular internal cavity size was moderately to severely dilated. There is no left ventricular hypertrophy. Left ventricular diastolic parameters are consistent with Grade III diastolic dysfunction (restrictive). Right Ventricle: The right ventricular size is normal. No increase in right ventricular wall thickness. Right ventricular systolic function is mildly reduced. There is normal pulmonary artery systolic pressure. The tricuspid regurgitant velocity is 1.98 m/s, and with an assumed right atrial pressure of 15  mmHg, the estimated right ventricular systolic pressure is 30.7 mmHg. Left [facility]: Left atrial size was moderately dilated. Right [facility]: Right atrial size was normal in size. Pericardium: There is no evidence of pericardial effusion. Mitral Valve: The mitral valve is abnormal. Moderate to severe mitral valve regurgitation. No evidence of mitral valve stenosis. MV peak gradient, 6.6 mmHg. The mean mitral valve gradient is 2.0 mmHg. Tricuspid Valve:  The tricuspid valve is normal in structure. Tricuspid valve regurgitation is trivial. Aortic Valve: The aortic valve is tricuspid. Aortic valve regurgitation is not visualized. No aortic stenosis is present. Pulmonic Valve: The pulmonic valve was normal in structure. Pulmonic valve regurgitation is trivial. Aorta: The aortic root is normal in size and structure. Venous: The inferior vena cava is dilated in size with less than 50% respiratory variability, suggesting right atrial pressure of 15 mmHg. IAS/Shunts: No atrial level shunt detected by color flow Doppler.  LEFT VENTRICLE PLAX 2D LVIDd:         7.00 cm      Diastology LVIDs:         6.50 cm      LV e' medial:    3.83 cm/s LV PW:         0.90 cm      LV E/e' medial:  32.5 LV IVS:        0.90 cm      LV e' lateral:   5.70 cm/s LVOT diam:     2.20 cm      LV E/e' lateral: 21.8 LV SV:         43 LV SV Index:   23 LVOT Area:     3.80 cm  LV Volumes (MOD) LV vol d, MOD A2C: 326.0 ml LV vol d, MOD A4C: 293.5 ml LV vol s, MOD A2C: 254.0 ml LV vol s, MOD A4C: 260.0 ml LV SV MOD A2C:     72.0 ml LV SV MOD A4C:     293.5 ml LV SV MOD BP:      56.2 ml RIGHT VENTRICLE             IVC RV S prime:     10.70 cm/s  IVC diam: 2.20 cm TAPSE (M-mode): 1.5 cm LEFT ATRIUM             Index        RIGHT ATRIUM           Index LA diam:        5.30 cm 2.87 cm/m   RA Area:     16.00 cm LA Vol (A2C):   94.5 ml 51.19 ml/m  RA Volume:   41.40 ml  22.43 ml/m LA Vol (A4C):   70.7 ml 38.30 ml/m LA Biplane Vol: 86.0 ml 46.58 ml/m  AORTIC  VALVE             PULMONIC VALVE LVOT Vmax:   68.30 cm/s  PR End Diast Vel: 1.73 msec LVOT Vmean:  41.900 cm/s LVOT VTI:    0.114 m  AORTA Ao Root diam: 3.40 cm Ao Asc diam:  3.00 cm MITRAL VALVE                  TRICUSPID VALVE MV Area (PHT): 6.14 cm       TR Peak grad:   15.7 mmHg MV Area VTI:   1.63 cm       TR Vmax:        198.00 cm/s MV Peak grad:  6.6 mmHg MV Mean grad:  2.0 mmHg       SHUNTS MV Vmax:       1.28 m/s       Systemic VTI:  0.11 m MV Vmean:      71.3 cm/s      Systemic Diam: 2.20 cm MV Decel Time: 124 msec MR Peak grad:    67.2 mmHg MR Mean  grad:    41.0 mmHg MR Vmax:         410.00 cm/s MR Vmean:        296.0 cm/s MR PISA:         4.02 cm MR PISA Eff ROA: 35 mm MR PISA Radius:  0.80 cm MV E velocity: 124.50 cm/s MV A velocity: 47.75 cm/s MV E/A ratio:  2.61 Dalton McleanMD Electronically signed by Wilfred Lacy Signature Date/Time: 05/15/2022/4:55:31 PM    Final      Medications:     Scheduled Medications:  aspirin  325 mg Oral Daily   Chlorhexidine Gluconate Cloth  6 each Topical Daily   enoxaparin (LOVENOX) injection  40 mg Subcutaneous Q24H   insulin aspart  0-5 Units Subcutaneous QHS   insulin aspart  0-9 Units Subcutaneous TID WC   insulin glargine-yfgn  15 Units Subcutaneous Daily   ipratropium-albuterol  3 mL Nebulization Q6H   senna-docusate  1 tablet Oral QHS   sodium chloride flush  10-40 mL Intracatheter Q12H    Infusions:  promethazine (PHENERGAN) injection (IM or IVPB)      PRN Medications: HYDROmorphone (DILAUDID) injection, nitroGLYCERIN, oxyCODONE, polyethylene glycol, prochlorperazine, promethazine (PHENERGAN) injection (IM or IVPB), sodium chloride flush    Patient Profile   [name] [name] is a 51 y.o. female with nonischemic cardiomyopathy (LVEF 20%), severe MR, schizoaffective disorder. AHF team asked to see for acute on chronic decompensated systolic heart failure.    Assessment/Plan   Acute on chronic decompensated systolic heart  failure - NYHA III, Stage D HFrEF, nonischemic.  - As noted above, extensive evaluation [facility]; patient not deemed a candidate for advanced therapies due to psychosocial barriers including continued tobacco use, lack of social support, possible schizoaffective disorder; was discharged on hospice, however, patient states daughter would not want nursing support in her home.  - On TTE 12/6, LVEF severely reduced - got IV lasix 80mg  x 1yesterday, CVP 7 today and reports dizziness. No further diuresis today - PICC just placed. Pending co-ox. - Digoxin 0.134mcg daily - Hold off BB - Will start afterload reduction as able, currently hypotensive    Severe functional MR - Severe centrally directed MR on TTE - Plan to start afterload reduction today but BP too soft  - Would be good candidate for TEER; will follow up outpatient after medical optimization  Length of Stay: 1  32m, NP  05/16/2022, 10:06 AM  Advanced Heart Failure Team Pager [phone] (M-F; 7a - 5p)  Please contact [facility] for night-coverage after hours (5p -7a ) and weekends on [email or web address]

## 2022-05-16 NOTE — Progress Notes (Signed)
Peripherally Inserted Central Catheter Placement  The IV Nurse has discussed with the patient and/or persons authorized to consent for the patient, the purpose of this procedure and the potential benefits and risks involved with this procedure.  The benefits include less needle sticks, lab draws from the catheter, and the patient may be discharged home with the catheter. Risks include, but not limited to, infection, bleeding, blood clot (thrombus formation), and puncture of an artery; nerve damage and irregular heartbeat and possibility to perform a PICC exchange if needed/ordered by physician.  Alternatives to this procedure were also discussed.  Bard Power PICC patient education guide, fact sheet on infection prevention and patient information card has been provided to patient /or left at bedside.    PICC Placement Documentation  PICC Double Lumen 05/16/22 Right Basilic 36 cm 0 cm (Active)  Indication for Insertion or Continuance of Line Prolonged intravenous therapies 05/16/22 0918  Exposed Catheter (cm) 0 cm 05/16/22 0918  Site Assessment Clean, Dry, Intact 05/16/22 0918  Lumen #1 Status Flushed;Saline locked;Blood return noted 05/16/22 0918  Lumen #2 Status Flushed;Saline locked;Blood return noted 05/16/22 0918  Dressing Status Antimicrobial disc in place 05/16/22 0918  Safety Lock Not Applicable 05/16/22 0918  Dressing Change Due 05/23/22 05/16/22 0918       Romie Jumper 05/16/2022, 9:21 AM

## 2022-05-16 NOTE — TOC Initial Note (Addendum)
Transition of Care [facility]) - Initial/Assessment Note    Patient Details  Name: [name] [name] MRN: [id number] Date of Birth: November 13, 1970  Transition of Care [facility]) CM/SW Contact:    Erenest Rasher, RN Phone Number: [phone] 05/16/2022, 4:18 PM  Clinical Narrative:          05/17/2022 Appt arranged with Dr Renold Don for Primary Care on 05/29/2022 at 9:45 am.           HF TOC CM spoke to pt and states she does not have transportation to her appts. Pt has Medicaid and can utilize Medicaid transportation. Will provide pt with contact information to call [place] # [phone] option 0. Pt states she was getting $63 and has contacted number to recertify. Son lives in the home with pt but he works during the day.    Expected Discharge Plan: Home/Self Care Barriers to Discharge: Continued Medical Work up   Patient Goals and CMS Choice        Expected Discharge Plan and Services Expected Discharge Plan: Home/Self Care   Discharge Planning Services: CM Consult   Living arrangements for the past 2 months: Single Family Home                                      Prior Living Arrangements/Services Living arrangements for the past 2 months: Single Family Home Lives with:: Adult Children Patient language and need for interpreter reviewed:: Yes Do you feel safe going back to the place where you live?: Yes      Need for Family Participation in Patient Care: No (Comment) Care giver support system in place?: Yes (comment)   Criminal Activity/Legal Involvement Pertinent to Current Situation/Hospitalization: No - Comment as needed  Activities of Daily Living Home Assistive Devices/Equipment: None ADL Screening (condition at time of admission) Patient's cognitive ability adequate to safely complete daily activities?: Yes Is the patient deaf or have difficulty hearing?: No Does the patient have difficulty seeing, even when wearing  glasses/contacts?: No Does the patient have difficulty concentrating, remembering, or making decisions?: No Patient able to express need for assistance with ADLs?: Yes Does the patient have difficulty dressing or bathing?: No Independently performs ADLs?: Yes (appropriate for developmental age) Does the patient have difficulty walking or climbing stairs?: Yes Weakness of Legs: None Weakness of Arms/Hands: None  Permission Sought/Granted Permission sought to share information with : Case Manager, Family Supports, PCP Permission granted to share information with : Yes, Verbal Permission Granted  Share Information with NAME: Marilynne Halsted     Permission granted to share info w Relationship: son  Permission granted to share info w Contact Information: [phone]  Emotional Assessment Appearance:: Appears stated age Attitude/Demeanor/Rapport: Engaged Affect (typically observed): Accepting Orientation: : Oriented to Self, Oriented to Place, Oriented to  Time, Oriented to Situation   Psych Involvement: No (comment)  Admission diagnosis:  Acute on chronic systolic CHF (congestive heart failure) (HCC) [I50.23] Acute on chronic congestive heart failure, unspecified heart failure type [facility]) [I50.9] Patient Active Problem List   Diagnosis Date Noted   Acute on chronic systolic CHF (congestive heart failure) ([place]) 05/15/2022   Acute pyelonephritis 07/17/2020   Homelessness 06/12/2020   Bipolar 1 disorder ([place]) 12/18/2018   Suicide attempt ([place])    Overdose, intentional self-harm, initial encounter ([place]) 12/13/2018   Bipolar I disorder, single manic episode, severe, with psychosis (  HCC) 06/09/2018   Bipolar disorder (HCC) 06/09/2018   DKA, type 2 (HCC) 01/06/2018   Diabetic acidosis without coma (HCC)    Adjustment disorder with depressed mood 01/04/2018   Oral thrush    Acute lower UTI    DKA, type 2, not at goal [facility]) 01/03/2018   Thrush 01/03/2018   DM (diabetes mellitus), type 2,  uncontrolled 06/07/2017   Abscess of groin, right 06/07/2017   Abscess 06/07/2017   Bipolar disorder with moderate depression (HCC) 08/31/2015   Tobacco use disorder 08/28/2015   HTN (hypertension) 08/28/2015   Diabetes (HCC) 08/28/2015   Hyponatremia 08/28/2015   T2DM (type 2 diabetes mellitus) (HCC) 10/26/2009   OBESITY 10/26/2009   Essential hypertension 10/26/2009   DISTURBANCE OF SKIN SENSATION 10/26/2009   Migraine headache 03/05/2007   PCP:  Patient, No Pcp Per Pharmacy:   [facility] [address] [place] [address] Phone: [phone] Fax: [phone]     Social Determinants of Health (SDOH) Interventions Food Insecurity Interventions: Intervention Not Indicated Housing Interventions: Intervention Not Indicated Transportation Interventions: Other (Comment), Payor Benefit Financial Strain Interventions: Intervention Not Indicated  Readmission Risk Interventions     No data to display

## 2022-05-16 NOTE — Progress Notes (Signed)
Heart Failure Navigator Progress Note  Assessed for Heart & Vascular TOC clinic readiness.  Patient does not meet criteria due to Advanced Heart Failure Team consult. .   Navigator will sign off at this time.    Avir Deruiter, BSN, RN Heart Failure Nurse Navigator Secure Chat Only   

## 2022-05-17 ENCOUNTER — Other Ambulatory Visit (HOSPITAL_COMMUNITY): Payer: Self-pay

## 2022-05-17 DIAGNOSIS — I5023 Acute on chronic systolic (congestive) heart failure: Secondary | ICD-10-CM | POA: Diagnosis not present

## 2022-05-17 LAB — COOXEMETRY PANEL
Carboxyhemoglobin: 1.4 % (ref 0.5–1.5)
Methemoglobin: 0.9 % (ref 0.0–1.5)
O2 Saturation: 60.7 %
Total hemoglobin: 11.8 g/dL — ABNORMAL LOW (ref 12.0–16.0)

## 2022-05-17 LAB — BASIC METABOLIC PANEL
Anion gap: 10 (ref 5–15)
BUN: 22 mg/dL — ABNORMAL HIGH (ref 6–20)
CO2: 28 mmol/L (ref 22–32)
Calcium: 8.4 mg/dL — ABNORMAL LOW (ref 8.9–10.3)
Chloride: 100 mmol/L (ref 98–111)
Creatinine, Ser: 1.04 mg/dL — ABNORMAL HIGH (ref 0.44–1.00)
GFR, Estimated: >60 mL/min (ref >60)
Glucose, Bld: 192 mg/dL — ABNORMAL HIGH (ref 70–99)
Potassium: 3.9 mmol/L (ref 3.5–5.1)
Sodium: 138 mmol/L (ref 135–145)

## 2022-05-17 LAB — MAGNESIUM: Magnesium: 2 mg/dL (ref 1.7–2.4)

## 2022-05-17 LAB — GLUCOSE, CAPILLARY
Glucose-Capillary: 98 mg/dL (ref 70–99)
Glucose-Capillary: 153 mg/dL — ABNORMAL HIGH (ref 70–99)

## 2022-05-17 MED ORDER — DIGOXIN 125 MCG PO TABS
0.1250 mg | ORAL_TABLET | Freq: Every day | ORAL | 0 refills | Status: DC
  Filled 2022-05-17: qty 30, 30d supply, fill #0

## 2022-05-17 MED ORDER — IPRATROPIUM-ALBUTEROL 0.5-2.5 (3) MG/3ML IN SOLN: 3.0000 mL | RESPIRATORY_TRACT | Status: DC | PRN

## 2022-05-17 NOTE — Progress Notes (Signed)
PT Cancellation Note  Patient Details Name: TERILYNN BURESH MRN: [id number] DOB: 1970/12/10   Cancelled Treatment:    Reason Eval/Treat Not Completed: Other (comment) Pt reports just ambulated and has the worst migraine that she has had.  Declined PT eval at this time. Will f/u as able. Anise Salvo, PT Acute Rehab [facility] [phone]   Rayetta Humphrey 05/17/2022, 10:31 AM

## 2022-05-17 NOTE — Progress Notes (Signed)
Advanced Heart Failure Rounding Note  PCP-Cardiologist: None   Subjective:   TTE 12/6, LVEF severely reduced   CVP <5  Denies CP and SOB.  Objective:   Weight Range: 78.2 kg Body mass index is 27.83 kg/m.   Vital Signs:   Temp:  [98.1 F (36.7 C)-98.9 F (37.2 C)] 98.4 F (36.9 C) (12/08 0316) Pulse Rate:  [78-85] 83 (12/08 0316) Resp:  [18] 18 (12/08 0316) BP: (91-106)/(51-72) 100/70 (12/08 0316) SpO2:  [91 %-98 %] 93 % (12/08 0316) Weight:  [78.2 kg] 78.2 kg (12/08 0316) Last BM Date : 05/13/22  Weight change: Filed Weights   05/15/22 2306 05/16/22 0348 05/17/22 0316  Weight: 78.7 kg 78.7 kg 78.2 kg    Intake/Output:   Intake/Output Summary (Last 24 hours) at 05/17/2022 0826 Last data filed at 05/17/2022 0321 Gross per 24 hour  Intake 535.6 ml  Output 700 ml  Net -164.4 ml      Physical Exam   CVP <5 General:  chronically ill appearing.  No respiratory difficulty HEENT: normal Neck: supple. No JVD. Carotids 2+ bilat; no bruits. No lymphadenopathy or thyromegaly appreciated. Cor: PMI nondisplaced. Regular rate & rhythm. No rubs, gallops or murmurs. Lungs: clear Abdomen: soft, nontender, nondistended. No hepatosplenomegaly. No bruits or masses. Good bowel sounds. Extremities: no cyanosis, clubbing, rash, edema. PICC RUE  Neuro: alert & oriented x 3, cranial nerves grossly intact. moves all 4 extremities w/o difficulty. Affect pleasant.   Telemetry   NSR 80s (Personally reviewed)    EKG    No new EKG to review  Labs    CBC Recent Labs    05/15/22 0218 05/15/22 0854  WBC 6.3 8.0  NEUTROABS 4.4  --   HGB 10.8* 10.8*  HCT 33.4* 33.8*  MCV 90.0 91.8  PLT 149* 149*   Basic Metabolic Panel Recent Labs    76/19/50 0218 05/15/22 0854 05/16/22 0110  NA 137  --  136  K 3.6  --  3.4*  CL 105  --  97*  CO2 23  --  26  GLUCOSE 255*  --  190*  BUN 20  --  23*  CREATININE 1.19* 1.11* 1.34*  CALCIUM 8.2*  --  8.6*  MG 1.7  --  1.8    Liver Function Tests Recent Labs    05/15/22 0218  AST 49*  ALT 40  ALKPHOS 124  BILITOT 1.0  PROT 6.0*  ALBUMIN 3.3*   No results for input(s): "LIPASE", "AMYLASE" in the last 72 hours. Cardiac Enzymes No results for input(s): "CKTOTAL", "CKMB", "CKMBINDEX", "TROPONINI" in the last 72 hours.  BNP: BNP (last 3 results) Recent Labs    05/15/22 0218  BNP 1,172.8*    ProBNP (last 3 results) No results for input(s): "PROBNP" in the last 8760 hours.   D-Dimer Recent Labs    05/15/22 0218  DDIMER 3.06*   Hemoglobin A1C Recent Labs    05/16/22 0110  HGBA1C 8.1*   Fasting Lipid Panel No results for input(s): "CHOL", "HDL", "LDLCALC", "TRIG", "CHOLHDL", "LDLDIRECT" in the last 72 hours. Thyroid Function Tests No results for input(s): "TSH", "T4TOTAL", "T3FREE", "THYROIDAB" in the last 72 hours.  Invalid input(s): "FREET3"  Other results:   Imaging    No results found.   Medications:     Scheduled Medications:  aspirin  325 mg Oral Daily   atorvastatin  20 mg Oral Daily   Chlorhexidine Gluconate Cloth  6 each Topical Daily   digoxin  0.125  mg Oral Daily   enoxaparin (LOVENOX) injection  40 mg Subcutaneous Q24H   insulin aspart  0-5 Units Subcutaneous QHS   insulin aspart  0-9 Units Subcutaneous TID WC   insulin glargine-yfgn  15 Units Subcutaneous Daily   ipratropium-albuterol  3 mL Nebulization QID   mometasone-formoterol  2 puff Inhalation BID   senna-docusate  1 tablet Oral QHS   sertraline  100 mg Oral Daily   sodium chloride flush  10-40 mL Intracatheter Q12H    Infusions:  promethazine (PHENERGAN) injection (IM or IVPB)      PRN Medications: nitroGLYCERIN, oxyCODONE, oxyCODONE-acetaminophen **AND** oxyCODONE, polyethylene glycol, prochlorperazine, promethazine (PHENERGAN) injection (IM or IVPB), sodium chloride flush    Patient Profile   [name] [name] is a 51 y.o. female with nonischemic cardiomyopathy (LVEF 20%), severe MR,  schizoaffective disorder. AHF team asked to see for acute on chronic decompensated systolic heart failure.    Assessment/Plan   Acute on chronic decompensated systolic heart failure - NYHA III, Stage D HFrEF, nonischemic.  - As noted above, extensive evaluation at [facility]; patient not deemed a candidate for advanced therapies due to psychosocial barriers including continued tobacco use, lack of social support, possible schizoaffective disorder; was discharged on hospice, however, patient states daughter would not want nursing support in her home.  - On TTE 12/6, LVEF severely reduced - CVP <5 today, dizziness resolved. No further diuresis - PICC placed. Initial co-ox 40%, 61% today.  - Continue Digoxin 0.123mcg daily - Hold off BB - Will start afterload reduction as able, currently BP too soft    Severe functional MR - Severe centrally directed MR on TTE - Plan to start afterload reduction today but BP too soft  - Would be good candidate for TEER; will follow up outpatient after medical optimization  SDOH - HF CM following - Difficulty getting to appts, resources provided - Dr. Gasper Lloyd had a lengthy conversion with patient about care and f/u plans. Patient adamant that she can't make it to appointments here (despite resources/transportation provided) and said she will establish care somewhere closer to her.   Stable to go home from AHF team. Will put info on AVS in case she wants to f/u here with [place]. Remove PICC at d/c.   Length of Stay: 2  Alen Bleacher, NP  05/17/2022, 8:26 AM  Advanced Heart Failure Team Pager [phone] (M-F; 7a - 5p)  Please contact [facility] for night-coverage after hours (5p -7a ) and weekends on [email or web address]

## 2022-05-17 NOTE — TOC Transition Note (Signed)
Transition of Care [facility]) - CM/SW Discharge Note   Patient Details  Name: [name] [name] MRN: [id number] Date of Birth: [birth year]  Transition of Care [facility]) CM/SW Contact:  Elliot Cousin, RN Phone Number: 05/17/2022, 12:15 PM   Clinical Narrative:     HF TOC CM provided pt with brochure for PCP office and information to contact for DSS transportation. States he son will provide transportation home. Meds to come up from [facility] pharmacy.   Final next level of care: Home/Self Care Barriers to Discharge: No Barriers Identified   Patient Goals and CMS Choice        Discharge Placement                       Discharge Plan and Services   Discharge Planning Services: CM Consult              Social Determinants of Health (SDOH) Interventions Food Insecurity Interventions: Intervention Not Indicated Housing Interventions: Intervention Not Indicated Transportation Interventions: Other (Comment), Payor Benefit Financial Strain Interventions: Intervention Not Indicated   Readmission Risk Interventions     No data to display

## 2022-05-17 NOTE — TOC Transition Note (Signed)
Transition of Care [facility]) - CM/SW Discharge Note   Patient Details  Name: [name] [name] MRN: [id number] Date of Birth: 05/26/1971  Transition of Care [facility]) CM/SW Contact:  Kermit Balo, RN Phone Number: 05/17/2022, 11:38 AM   Clinical Narrative:    Pt has number for transportation services on the AVS. She will just need to call and arrange medicaid transportation.  She has 2 appointments for PCP so can attend either that she prefers.  CM discussed with her the importance of attending her MD appointments and taking her medications as prescribed.  Pt states her son will provide transport home.    Final next level of care: Home/Self Care Barriers to Discharge: No Barriers Identified   Patient Goals and CMS Choice        Discharge Placement                       Discharge Plan and Services   Discharge Planning Services: CM Consult                                 Social Determinants of Health (SDOH) Interventions Food Insecurity Interventions: Intervention Not Indicated Housing Interventions: Intervention Not Indicated Transportation Interventions: Other (Comment), Payor Benefit Financial Strain Interventions: Intervention Not Indicated   Readmission Risk Interventions     No data to display

## 2022-05-17 NOTE — Evaluation (Signed)
Occupational Therapy Evaluation Patient Details Name: [name] [name] MRN: [id number] DOB: 1970/10/20 Today's Date: 05/17/2022   History of Present Illness Pt is a 51 y/o female presenting on 12/6 with SOB and chest pain. Admitted with acute on chronic CHF, acute on chronic decompensated heart failure. PMH includes: nonischemic cardiomyopathy, severe MR, schizoaffective disorder.   Clinical Impression   Pt admitted for above and presents with problem list below.  She reports living with her son/daughter in law, but she is independent for ADLs, light IADLs, completing mobility without AD.  Pt is mildly unsteady during session, reports dizziness but BP stable (soft but stable), SPO2 97%.  Pt completes ADLs with modified independence, functional mobility with supervision due to lines (while monitoring vitals). She fatigues easily and discussed energy conservation techniques. Based on performance today, no further OT needs identified and OT will sign off.       Recommendations for follow up therapy are one component of a multi-disciplinary discharge planning process, led by the attending physician.  Recommendations may be updated based on patient status, additional functional criteria and insurance authorization.   Follow Up Recommendations  No OT follow up     Assistance Recommended at Discharge PRN  Patient can return home with the following Assist for transportation;Help with stairs or ramp for entrance;Assistance with cooking/housework    Functional Status Assessment     Equipment Recommendations  None recommended by OT    Recommendations for Other Services       Precautions / Restrictions Precautions Precautions: Fall Restrictions Weight Bearing Restrictions: No      Mobility Bed Mobility Overal bed mobility: Independent                  Transfers Overall transfer level: Independent                        Balance Overall balance assessment: Mild  deficits observed, not formally tested                                         ADL either performed or assessed with clinical judgement   ADL Overall ADL's : Modified independent                                       General ADL Comments: discussed energy conservation and safety, pt voiced understanding     Vision   Vision Assessment?: No apparent visual deficits     Perception     Praxis      Pertinent Vitals/Pain Pain Assessment Pain Assessment: Faces Faces Pain Scale: Hurts a little bit Pain Location: back, migrane, chest Pain Descriptors / Indicators: Discomfort Pain Intervention(s): Limited activity within patient's tolerance, Monitored during session, Repositioned, Premedicated before session     Hand Dominance Right   Extremity/Trunk Assessment Upper Extremity Assessment Upper Extremity Assessment: Overall WFL for tasks assessed   Lower Extremity Assessment Lower Extremity Assessment: Defer to PT evaluation       Communication Communication Communication: No difficulties   Cognition Arousal/Alertness: Awake/alert Behavior During Therapy: WFL for tasks assessed/performed Overall Cognitive Status: Within Functional Limits for tasks assessed  General Comments  pt frustrated with "beeping" pt reports turning off her monitors prior to OT entering, pt unplugged telemetry box during session - RN notified and reports okay as pt off monitoring    Exercises     Shoulder Instructions      Home Living Family/patient expects to be discharged to:: Private residence Living Arrangements: Children (son, dil) Available Help at Discharge: Family;Available PRN/intermittently (son works during day) Type of Home: House Home Access: Level entry     Home Layout: One level     Bathroom Shower/Tub: Tub/shower unit;Walk-in shower   Bathroom Toilet: Standard     Home Equipment:  None          Prior Functioning/Environment Prior Level of Function : Independent/Modified Independent             Mobility Comments: independent ADLs Comments: independent ADLs, limited IADLs, not driving        OT Problem List:        OT Treatment/Interventions:      OT Goals(Current goals can be found in the care plan section) Acute Rehab OT Goals Patient Stated Goal: get better OT Goal Formulation: With patient  OT Frequency:      Co-evaluation              AM-PAC OT "6 Clicks" Daily Activity     Outcome Measure Help from another person eating meals?: None Help from another person taking care of personal grooming?: None Help from another person toileting, which includes using toliet, bedpan, or urinal?: None Help from another person bathing (including washing, rinsing, drying)?: None Help from another person to put on and taking off regular upper body clothing?: None Help from another person to put on and taking off regular lower body clothing?: None 6 Click Score: 24   End of Session Nurse Communication: Mobility status  Activity Tolerance: Patient tolerated treatment well Patient left: in bed;with call bell/phone within reach  OT Visit Diagnosis: Muscle weakness (generalized) (M62.81)                Time: 1740-8144 OT Time Calculation (min): 20 min Charges:  OT General Charges $OT Visit: 1 Visit OT Evaluation $OT Eval Low Complexity: 1 Low  Barry Brunner, OT Acute Rehabilitation Services Office [phone]   Chancy Milroy 05/17/2022, 10:25 AM

## 2022-05-17 NOTE — Plan of Care (Signed)

## 2022-05-17 NOTE — Discharge Summary (Signed)
Physician Discharge Summary  [name] [name] AGT:[id number] DOB: 11-Sep-1970 DOA: 05/15/2022  PCP: Patient, No Pcp Per  Admit date: 05/15/2022 Discharge date: 05/17/2022 30 Day Unplanned Readmission Risk Score    Flowsheet Row ED to Hosp-Admission (Current) from 05/15/2022 in [facility]  30 Day Unplanned Readmission Risk Score (%) 20.8 Filed at 05/17/2022 0801       This score is the patient's risk of an unplanned readmission within 30 days of being discharged (0 -100%). The score is based on dignosis, age, lab data, medications, orders, and past utilization.   Low:  0-14.9   Medium: 15-21.9   High: 22-29.9   Extreme: 30 and above          Admitted From: Home Disposition: Home  Recommendations for Outpatient Follow-up:  Follow up with PCP in 1-2 weeks Please obtain BMP/CBC in one week Please establish relationship and closely follow-up with heart failure/cardiology team of your choice. Please follow up with your PCP on the following pending results: Unresulted Labs (From admission, onward)     Start     Ordered   05/22/22 0500  Creatinine, serum  (enoxaparin (LOVENOX)    CrCl >/= 30 ml/min)  Weekly,   R     Comments: while on enoxaparin therapy    05/15/22 0647   05/17/22 0500  Cooxemetry Panel (carboxy, met, total hgb, O2 sat)  Daily,   R     Question:  Specimen collection method  Answer:  Lab=Lab collect   05/16/22 1232   05/17/22 0500  Cooxemetry Panel (carboxy, met, total hgb, O2 sat)  Daily,   R     Question:  Specimen collection method  Answer:  Lab=Lab collect   05/16/22 1642              Home Health: None Equipment/Devices: None  Discharge Condition: Stable CODE STATUS: Full code Diet recommendation: Cardiac/low-sodium  HPI: [name] [name] is a 51 y.o. female with medical history significant for HFrEF 25%, non ischemic cardiomyopathy, ongoing tobacco use 1 pack/day, chronic back pain on chronic opioids, bipolar disorder, who presented to  [facility] ED from home due to progressive shortness of breath and chest pain.  For the past 2 days she has had exertional dyspnea and increased work of breathing.  Today her symptoms acutely worsened and progressed to pleuritic chest pain worse with deep inspiration.  EMS was activated and she was brought into the ED for further evaluation.   In the ED, workup revealed acute on chronic HFrEF with elevated BNP greater than 1000, pulmonary edema on CT scan.  She was started on diuretics in the ED.  She has chest wall tenderness on exam.  EDP requested admission for further diuresing.  The patient was admitted by the hospitalist service, [facility].   ED Course: Tmax 98.9.  BP 126/70, pulse 87, respiratory 21, O2 saturation 95% on 2 L.  Lab studies remarkable for serum glucose 255, creatinine 1.19 with baseline creatinine 0.9, GFR 55, AST 49, troponin 25, 28.  Hemoglobin 10.8.  Platelet count 149.  Subjective: Seen and examined.  Doing well.  No complaints.  She is feeling comfortable going home.  Brief/Interim Summary: Patient was admitted with acute on chronic systolic congestive heart failure.  Details below.  Acute on chronic systolic congestive heart failure: Previously EF was 25%.  Echo at this time shows further reduced EF of less than 20%.  Global hypokinesis.  Grade 3 diastolic dysfunction.  Presented with BNP greater  than thousand, pulmonary edema on CT chest.  She received 2 doses of IV Lasix.  Feels much better.  No more crackles or wheezing on examination.  She was never hyper oxygen.  Heart failure team saw her.  Underwent PICC line and cold was monitored.  CVP was 7.  She is dry on exam per cardiology and does not need further IV diuresis.  They have started her on digoxin.  They have cleared her for discharge.  They highly recommended that she closely follows up with heart failure team.  She has had an extensive workup at [facility] and was denied advanced therapies due to psychosocial barriers.  She has  end-stage dilated cardiomyopathy with high 1 year mortality.  Cardiology discussed the severity of her heart failure and provided her with multiple options including Medicaid assisted transportation to their clinic and possible [facility] evaluation in the near future but at this time, she says that the drive would be too far for her and she wishes to find care locally.  She has been given all the information of cardiology's office here.  Per cardiology, unable to start afterload reduction secondary to hypotension.  She needs to closely follow-up with outpatient cardiology for addition of GDMT.  They recommended resuming Lasix as needed as she was doing before.   Elevated troponin: Slightly elevated troponin but flat, consistent with demand ischemia.   Type 2 diabetes mellitus: Previous hemoglobin A1c 7.4.  Resume home medications.   AKI: Previous creatinine around 0.9, presented with 1.19, jumped to 1.34 yesterday but improved to 1.04 today.     COPD: She did have wheezing yesterday, started on scheduled DuoNeb.  No wheezes today.   Hypokalemia: Resolved.   Hyperlipidemia: Resume atorvastatin.   Essential hypertension: Blood pressure soft.  Continue to hold antihypertensives.  Cardiology recommended discontinuing beta-blocker for now.   Chronic anxiety/depression/possible schizophrenia: Continue home medications/Zoloft.   Chronic low back pain: Continue home medications.  Discharge plan was discussed with patient and/or family member and they verbalized understanding and agreed with it.  Discharge Diagnoses:  Principal Problem:   Acute on chronic systolic CHF (congestive heart failure) ([place])    Discharge Instructions   Allergies as of 05/17/2022       Reactions   Tramadol Nausea Only   "feeling like I'm on fire"   Wellbutrin [bupropion] Other (See Comments)   rage   Nicotine Rash   Patient states she is allergic to the Nicotine patch and they cause her to break out in a rash.   She states she can smoke cigarettes.        Medication List     STOP taking these medications    metoprolol succinate 25 MG 24 hr tablet Commonly known as: TOPROL-XL   multivitamin with minerals Tabs tablet   Vitamin D3 250 MCG (10000 UT) capsule       TAKE these medications    Advair Diskus 250-50 MCG/ACT Aepb Generic drug: fluticasone-salmeterol Inhale 1 puff into the lungs in the morning and at bedtime.   atorvastatin 20 MG tablet Commonly known as: LIPITOR Take 20 mg by mouth daily.   blood glucose meter kit and supplies Dispense based on patient and insurance preference. Use up to four times daily as directed. (FOR ICD-10 E10.9, E11.9).   digoxin 0.125 MG tablet Commonly known as: LANOXIN Take 1 tablet (0.125 mg total) by mouth daily. Start taking on: May 18, 2022   furosemide 40 MG tablet Commonly known as: LASIX Take 80 mg  by mouth daily as needed.   Lantus SoloStar 100 UNIT/ML Solostar Pen Generic drug: insulin glargine Inject 20 Units into the skin in the morning. Do not take if blood sugar is <100.   magnesium oxide 400 (241.3 Mg) MG tablet Commonly known as: MAG-OX Take 1 tablet (400 mg total) by mouth 2 (two) times daily.   oxyCODONE-acetaminophen 10-325 MG tablet Commonly known as: PERCOCET Take 1 tablet by mouth every 4 (four) hours as needed for pain.   potassium chloride SA 20 MEQ tablet Commonly known as: KLOR-CON M Take 20 mEq by mouth daily.   sertraline 100 MG tablet Commonly known as: ZOLOFT Take 1.5 tablets (150 mg total) by mouth daily. What changed: how much to take        [place] [facility] Follow up.   Why: Please call and ask to speak to Denny Peon, Electrical engineer to set up transportation to medical appts through [employer] information: [phone] option 0        [place]. Schedule an  appointment as soon as possible for a visit.   Specialty: Cardiology Why: [phone] Contact information: [address] [id number] [place] [phone]        [facility] Department Follow up.   Why: appt scheduled for 05/29/2022 at 930 am, please call to reschedule if you are unabe to keep appt. Contact information: Dr Renold Don [address] [place]               Allergies  Allergen Reactions   Tramadol Nausea Only    "feeling like I'm on fire"   Wellbutrin [Bupropion] Other (See Comments)    rage   Nicotine Rash    Patient states she is allergic to the Nicotine patch and they cause her to break out in a rash.  She states she can smoke cigarettes.    Consultations: Cardiology   Procedures/Studies: [place] EKG SITE RITE  Result Date: 05/15/2022 If Site Rite image not attached, placement could not be confirmed due to current cardiac rhythm.  ECHOCARDIOGRAM COMPLETE  Result Date: 05/15/2022    ECHOCARDIOGRAM REPORT   Patient Name:   [name] [name] Date of Exam: 05/15/2022 Medical Rec #:  [id number]       Height:       65.0 in Accession #:    [id number]      Weight:       170.0 lb Date of Birth:  December 22, 1970       BSA:          1.846 m Patient Age:    3 years        BP:           106/72 mmHg Patient Gender: F               HR:           74 bpm. Exam Location:  Inpatient Procedure: 2D Echo, Cardiac Doppler, Color Doppler and Intracardiac            Opacification Agent Indications:    I50.40* Unspecified combined systolic (congestive) and diastolic                 (congestive) heart failure; R07.9* Chest pain, unspecified  History:        Patient has no prior history of Echocardiogram examinations.  CHF, Signs/Symptoms:Shortness of Breath, Dyspnea and Chest Pain;                 Risk Factors:Hypertension, Diabetes and Current Smoker.  Sonographer:    Roseanna Rainbow RDCS Referring Phys: [id number]  [place]  1. Left ventricular ejection fraction, by estimation, is <20%. The left ventricle has severely decreased function. The left ventricle demonstrates global hypokinesis. The left ventricular internal cavity size was moderately to severely dilated. Left ventricular diastolic parameters are consistent with Grade III diastolic dysfunction (restrictive). No LV thrombus visualized.  2. Right ventricular systolic function is mildly reduced. The right ventricular size is normal. There is normal pulmonary artery systolic pressure. The estimated right ventricular systolic pressure is 63.8 mmHg.  3. Left atrial size was moderately dilated.  4. The mitral valve is abnormal. Moderate to severe central mitral valve regurgitation, PISA ERO 0.35 cm^2 (visually MR looks severe). Suspect functional MR with dilated LV. No evidence of mitral stenosis.  5. The aortic valve is tricuspid. Aortic valve regurgitation is not visualized. No aortic stenosis is present.  6. The inferior vena cava is dilated in size with <50% respiratory variability, suggesting right atrial pressure of 15 mmHg. FINDINGS  Left Ventricle: Left ventricular ejection fraction, by estimation, is <20%. The left ventricle has severely decreased function. The left ventricle demonstrates global hypokinesis. The left ventricular internal cavity size was moderately to severely dilated. There is no left ventricular hypertrophy. Left ventricular diastolic parameters are consistent with Grade III diastolic dysfunction (restrictive). Right Ventricle: The right ventricular size is normal. No increase in right ventricular wall thickness. Right ventricular systolic function is mildly reduced. There is normal pulmonary artery systolic pressure. The tricuspid regurgitant velocity is 1.98 m/s, and with an assumed right atrial pressure of 15 mmHg, the estimated right ventricular systolic pressure is 75.6 mmHg. Left Atrium: Left atrial size was moderately  dilated. Right Atrium: Right atrial size was normal in size. Pericardium: There is no evidence of pericardial effusion. Mitral Valve: The mitral valve is abnormal. Moderate to severe mitral valve regurgitation. No evidence of mitral valve stenosis. MV peak gradient, 6.6 mmHg. The mean mitral valve gradient is 2.0 mmHg. Tricuspid Valve: The tricuspid valve is normal in structure. Tricuspid valve regurgitation is trivial. Aortic Valve: The aortic valve is tricuspid. Aortic valve regurgitation is not visualized. No aortic stenosis is present. Pulmonic Valve: The pulmonic valve was normal in structure. Pulmonic valve regurgitation is trivial. Aorta: The aortic root is normal in size and structure. Venous: The inferior vena cava is dilated in size with less than 50% respiratory variability, suggesting right atrial pressure of 15 mmHg. IAS/Shunts: No atrial level shunt detected by color flow Doppler.  LEFT VENTRICLE PLAX 2D LVIDd:         7.00 cm      Diastology LVIDs:         6.50 cm      LV e' medial:    3.83 cm/s LV PW:         0.90 cm      LV E/e' medial:  32.5 LV IVS:        0.90 cm      LV e' lateral:   5.70 cm/s LVOT diam:     2.20 cm      LV E/e' lateral: 21.8 LV SV:         43 LV SV Index:   23 LVOT Area:     3.80 cm  LV Volumes (MOD) LV  vol d, MOD A2C: 326.0 ml LV vol d, MOD A4C: 293.5 ml LV vol s, MOD A2C: 254.0 ml LV vol s, MOD A4C: 260.0 ml LV SV MOD A2C:     72.0 ml LV SV MOD A4C:     293.5 ml LV SV MOD BP:      56.2 ml RIGHT VENTRICLE             IVC RV S prime:     10.70 cm/s  IVC diam: 2.20 cm TAPSE (M-mode): 1.5 cm LEFT ATRIUM             Index        RIGHT ATRIUM           Index LA diam:        5.30 cm 2.87 cm/m   RA Area:     16.00 cm LA Vol (A2C):   94.5 ml 51.19 ml/m  RA Volume:   41.40 ml  22.43 ml/m LA Vol (A4C):   70.7 ml 38.30 ml/m LA Biplane Vol: 86.0 ml 46.58 ml/m  AORTIC VALVE             PULMONIC VALVE LVOT Vmax:   68.30 cm/s  PR End Diast Vel: 1.73 msec LVOT Vmean:  41.900 cm/s LVOT  VTI:    0.114 m  AORTA Ao Root diam: 3.40 cm Ao Asc diam:  3.00 cm MITRAL VALVE                  TRICUSPID VALVE MV Area (PHT): 6.14 cm       TR Peak grad:   15.7 mmHg MV Area VTI:   1.63 cm       TR Vmax:        198.00 cm/s MV Peak grad:  6.6 mmHg MV Mean grad:  2.0 mmHg       SHUNTS MV Vmax:       1.28 m/s       Systemic VTI:  0.11 m MV Vmean:      71.3 cm/s      Systemic Diam: 2.20 cm MV Decel Time: 124 msec MR Peak grad:    67.2 mmHg MR Mean grad:    41.0 mmHg MR Vmax:         410.00 cm/s MR Vmean:        296.0 cm/s MR PISA:         4.02 cm MR PISA Eff ROA: 35 mm MR PISA Radius:  0.80 cm MV E velocity: 124.50 cm/s MV A velocity: 47.75 cm/s MV E/A ratio:  2.61 Dalton McleanMD Electronically signed by Franki Monte Signature Date/Time: 05/15/2022/4:55:31 PM    Final    CT Angio Chest PE W and/or Wo Contrast  Result Date: 05/15/2022 CLINICAL DATA:  Pulmonary embolism suspected, high probability. Shortness of breath, chest pain, and pulmonary edema. EXAM: CT ANGIOGRAPHY CHEST WITH CONTRAST TECHNIQUE: Multidetector CT imaging of the chest was performed using the standard protocol during bolus administration of intravenous contrast. Multiplanar CT image reconstructions and MIPs were obtained to evaluate the vascular anatomy. RADIATION DOSE REDUCTION: This exam was performed according to the departmental dose-optimization program which includes automated exposure control, adjustment of the mA and/or kV according to patient size and/or use of iterative reconstruction technique. CONTRAST:  28m OMNIPAQUE IOHEXOL 350 MG/ML SOLN COMPARISON:  01/03/2018. FINDINGS: Cardiovascular: Heart is enlarged and there is no pericardial effusion. There is atherosclerotic calcification of the aorta without evidence of aneurysm. The pulmonary trunk is mildly  distended which may be associated with underlying pulmonary artery hypertension. No pulmonary embolism is identified. Mediastinum/Nodes: Prominent lymph nodes are noted in  the mediastinum in the hilar regions bilaterally. No axillary lymphadenopathy. The thyroid gland, trachea, and esophagus are within normal limits. Lungs/Pleura: Hazy attenuation is present in the lungs bilaterally. No effusion or pneumothorax. Upper Abdomen: There is reflux of contrast into the inferior vena cava and hepatic veins, which may be associated with right heart failure. No acute abnormality. Musculoskeletal: Mild degenerative changes in the thoracic spine. No acute osseous abnormality. Review of the MIP images confirms the above findings. IMPRESSION: 1. No evidence of pulmonary embolism. 2. Hazy attenuation in the lungs bilaterally, possible edema or infiltrate. 3. Cardiomegaly. 4. Distended pulmonary trunk suggesting underlying pulmonary artery hypertension. 5. Reflux of contrast into the inferior vena cava and hepatic veins which may be associated with right heart failure. 6. Aortic atherosclerosis. Electronically Signed   By: Brett Fairy M.D.   On: 05/15/2022 04:54   DG Chest Portable 1 View  Result Date: 05/15/2022 CLINICAL DATA:  Six shortness of breath, hypoxia EXAM: PORTABLE CHEST 1 VIEW COMPARISON:  09/04/2020 FINDINGS: Cardiomegaly. No confluent airspace opacities, effusions or edema. No acute bony abnormality. IMPRESSION: Cardiomegaly.  No active disease. Electronically Signed   By: Rolm Baptise M.D.   On: 05/15/2022 02:47     Discharge Exam: Vitals:   05/17/22 0830 05/17/22 0838  BP:    Pulse: 92   Resp:    Temp:    SpO2:  95%   Vitals:   05/17/22 0017 05/17/22 0316 05/17/22 0830 05/17/22 0838  BP: 106/60 100/70    Pulse: 82 83 92   Resp: 18 18    Temp: 98.1 F (36.7 C) 98.4 F (36.9 C)    TempSrc: Oral Oral    SpO2: 93% 93%  95%  Weight:  78.2 kg    Height:        General: Pt is alert, awake, not in acute distress Cardiovascular: RRR, S1/S2 +, no rubs, no gallops Respiratory: CTA bilaterally, no wheezing, no rhonchi Abdominal: Soft, NT, ND, bowel sounds  + Extremities: no edema, no cyanosis    The results of significant diagnostics from this hospitalization (including imaging, microbiology, ancillary and laboratory) are listed below for reference.     Microbiology: No results found for this or any previous visit (from the past 240 hour(s)).   Labs: BNP (last 3 results) Recent Labs    05/15/22 0218  BNP 6,160.7*   Basic Metabolic Panel: Recent Labs  Lab 05/15/22 0218 05/15/22 0854 05/16/22 0110 05/17/22 0804  NA 137  --  136 138  K 3.6  --  3.4* 3.9  CL 105  --  97* 100  CO2 23  --  26 28  GLUCOSE 255*  --  190* 192*  BUN 20  --  23* 22*  CREATININE 1.19* 1.11* 1.34* 1.04*  CALCIUM 8.2*  --  8.6* 8.4*  MG 1.7  --  1.8 2.0   Liver Function Tests: Recent Labs  Lab 05/15/22 0218  AST 49*  ALT 40  ALKPHOS 124  BILITOT 1.0  PROT 6.0*  ALBUMIN 3.3*   No results for input(s): "LIPASE", "AMYLASE" in the last 168 hours. No results for input(s): "AMMONIA" in the last 168 hours. CBC: Recent Labs  Lab 05/15/22 0218 05/15/22 0854  WBC 6.3 8.0  NEUTROABS 4.4  --   HGB 10.8* 10.8*  HCT 33.4* 33.8*  MCV 90.0 91.8  PLT  149* 149*   Cardiac Enzymes: No results for input(s): "CKTOTAL", "CKMB", "CKMBINDEX", "TROPONINI" in the last 168 hours. BNP: Invalid input(s): "POCBNP" CBG: Recent Labs  Lab 05/16/22 1357 05/16/22 1625 05/16/22 2127 05/16/22 2200 05/17/22 0616  GLUCAP 285* 151* 264* 98 153*   D-Dimer Recent Labs    05/15/22 0218  DDIMER 3.06*   Hgb A1c Recent Labs    05/16/22 0110  HGBA1C 8.1*   Lipid Profile No results for input(s): "CHOL", "HDL", "LDLCALC", "TRIG", "CHOLHDL", "LDLDIRECT" in the last 72 hours. Thyroid function studies No results for input(s): "TSH", "T4TOTAL", "T3FREE", "THYROIDAB" in the last 72 hours.  Invalid input(s): "FREET3" Anemia work up No results for input(s): "VITAMINB12", "FOLATE", "FERRITIN", "TIBC", "IRON", "RETICCTPCT" in the last 72 hours. Urinalysis     Component Value Date/Time   COLORURINE YELLOW 08/22/2020 1929   APPEARANCEUR CLEAR 08/22/2020 1929   LABSPEC 1.022 08/22/2020 1929   PHURINE 5.0 08/22/2020 1929   GLUCOSEU >=500 (A) 08/22/2020 1929   HGBUR NEGATIVE 08/22/2020 1929   HGBUR trace-intact 11/25/2008 [place] 08/22/2020 [place] NEGATIVE 08/22/2020 1929   PROTEINUR NEGATIVE 08/22/2020 1929   UROBILINOGEN 0.2 05/12/2014 2315   NITRITE NEGATIVE 08/22/2020 1929   LEUKOCYTESUR TRACE (A) 08/22/2020 1929   Sepsis Labs Recent Labs  Lab 05/15/22 0218 05/15/22 0854  WBC 6.3 8.0   Microbiology No results found for this or any previous visit (from the past 240 hour(s)).   Time coordinating discharge: Over 30 minutes  SIGNED:   Darliss Cheney, MD  [facility] 05/17/2022, 10:35 AM *Please note that this is a verbal dictation therefore any spelling or grammatical errors are due to the "[employer]" system interpretation. If 7PM-7AM, please contact night-coverage [email or web address]

## 2022-05-19 IMAGING — CT CT HEAD W/O CM
3 of 8 series · 13 of 47 positions shown, 15 images · non-contrast
Comparison: June 04, 2020

CLINICAL DATA: Dizziness.

EXAM:
CT HEAD WITHOUT CONTRAST
TECHNIQUE: Contiguous axial images were obtained from the base of the skull
through the vertex without intravenous contrast.

[Series 7: coronal soft tissue · coronal · 0.30mm/px · 3 of 74 slices shown]
[im 19/74  brain]
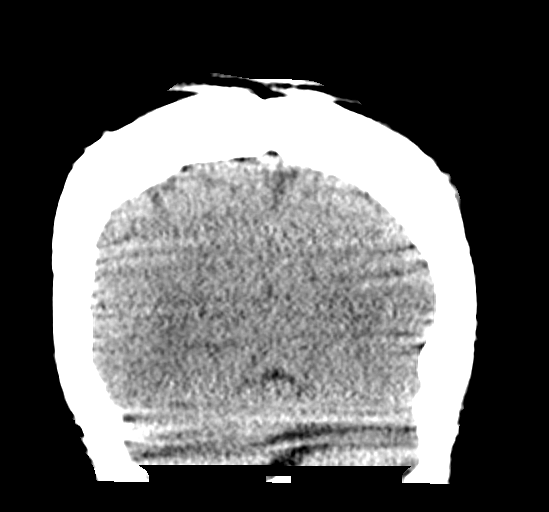
[im 37/74  brain]
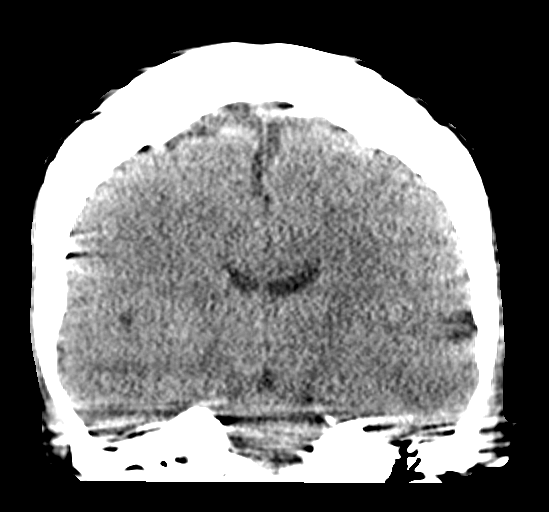
[im 55/74  brain]
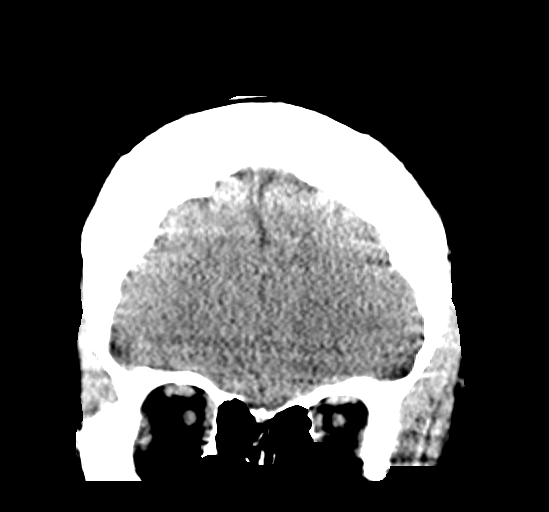

[Series 8: sagittal soft tissue · sagittal · 0.34mm/px · 2 of 59 slices shown]
[im 20/59  brain]
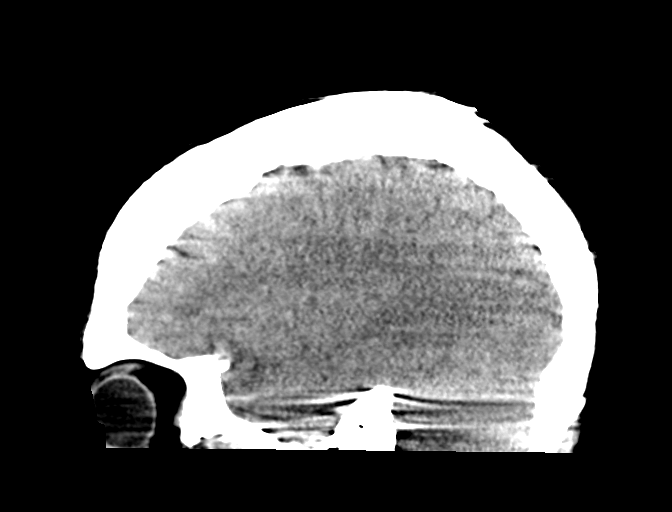
[im 39/59  brain]
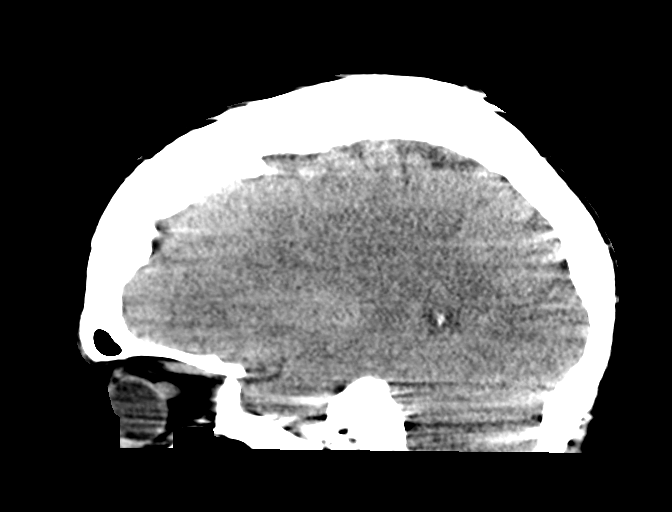

[Series 12: true axial · axial · 0.33mm/px · z∈[-168,-45]mm · 8 of 55 slices shown, 10 images]
[im 7/55  brain]
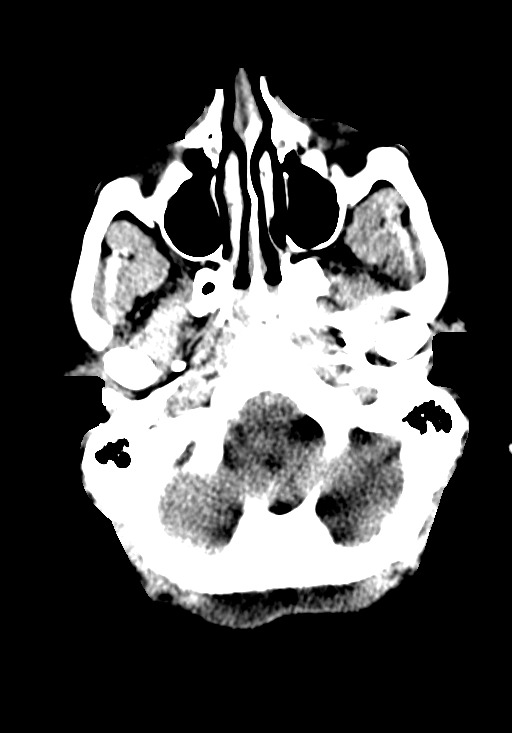
[im 7/55  bone]
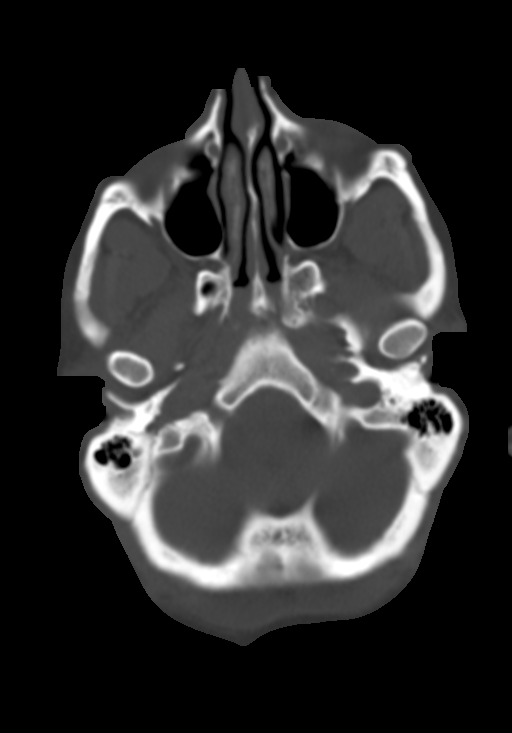
[im 13/55  brain]
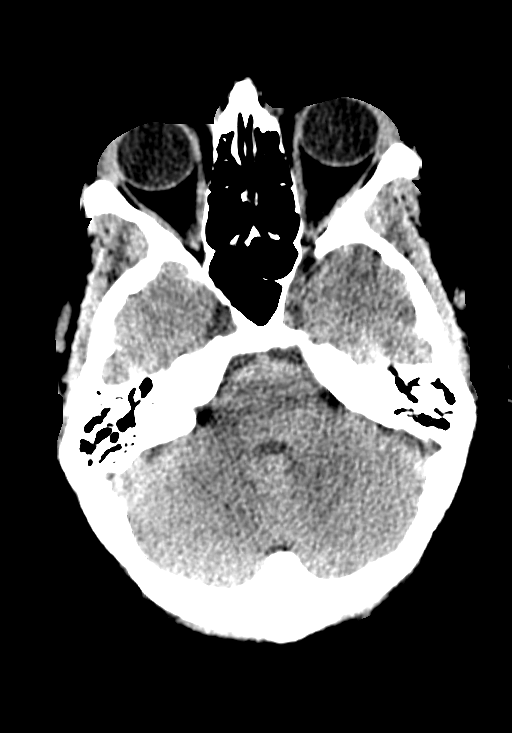
[im 19/55  brain]
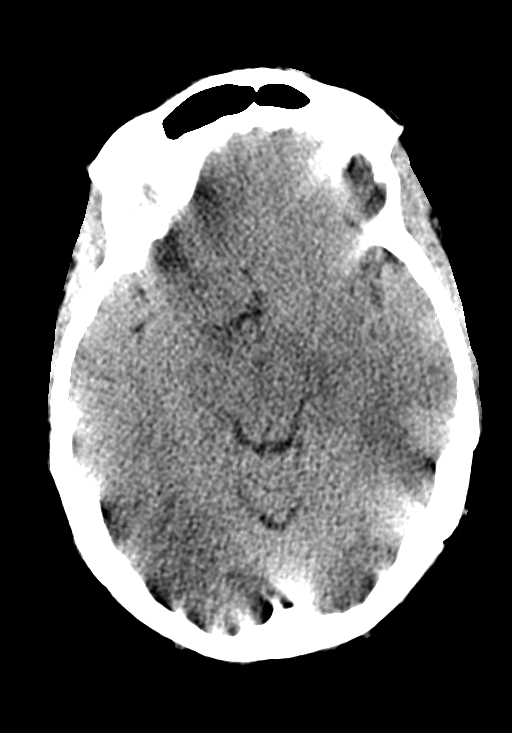
[im 25/55  brain]
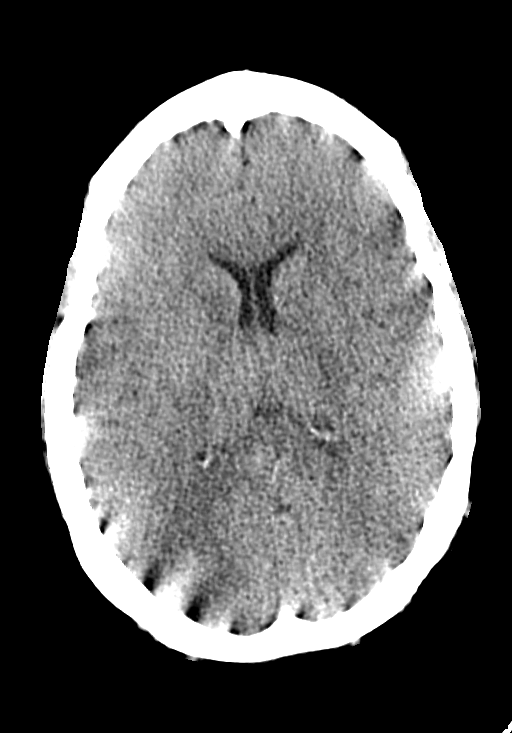
[im 31/55  brain]
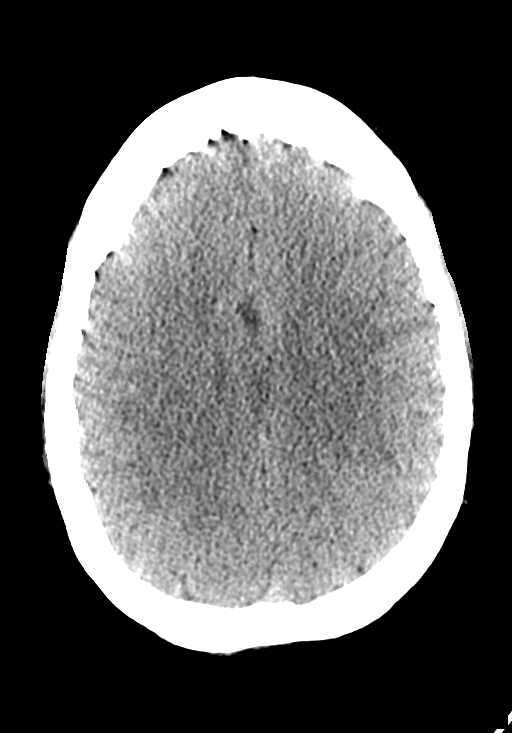
[im 31/55  bone]
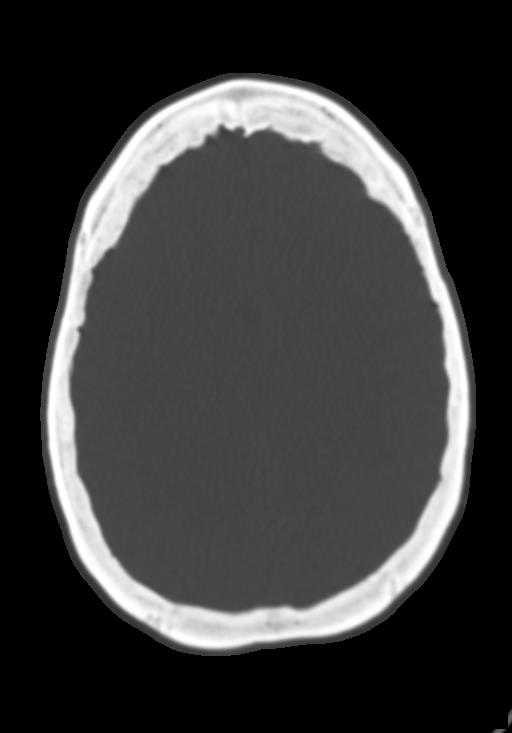
[im 37/55  brain]
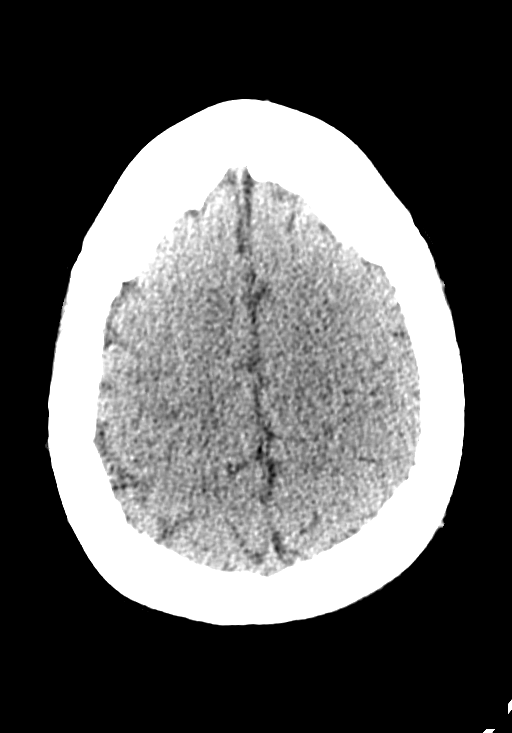
[im 43/55  brain]
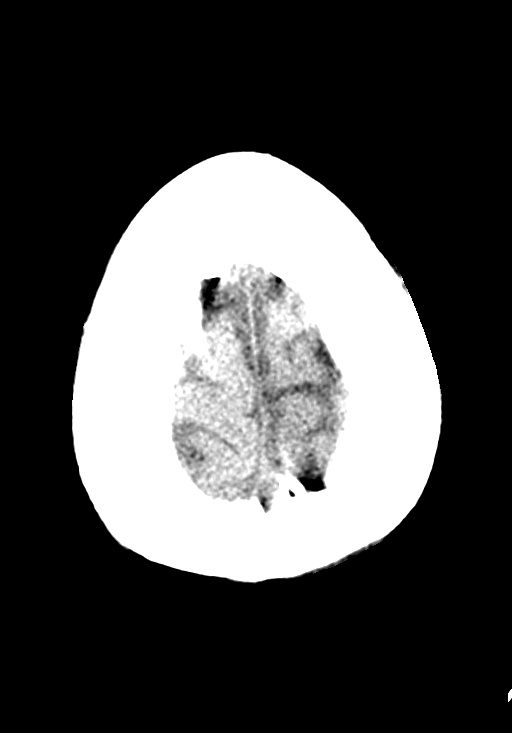
[im 49/55  brain]
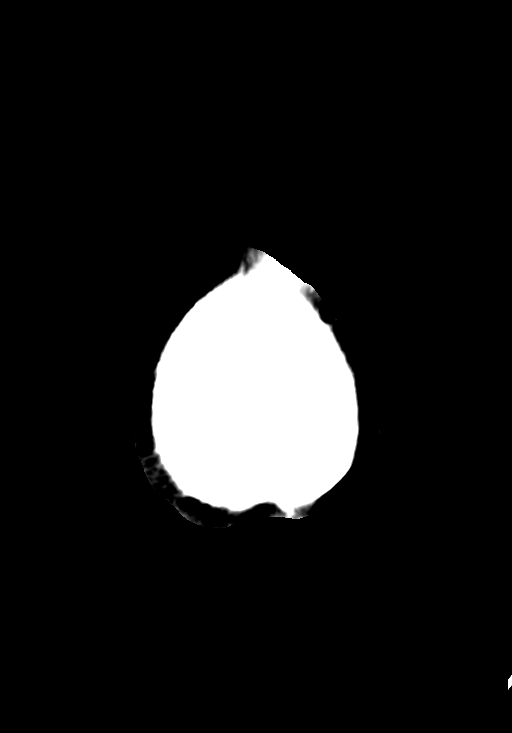

[13 of 47 positions shown; findings below may reference images not displayed]

FINDINGS: Brain: No evidence of acute hemorrhage, hydrocephalus, extra-axial
collection or mass lesion/mass effect.

A small, ill-defined area of white matter low attenuation is seen
within the basal ganglia on the right. This is not clearly
identified on the prior study. There is no evidence of associated
mass effect or midline shift.

Vascular: No hyperdense vessel or unexpected calcification.

Skull: Normal. Negative for fracture or focal lesion.

Sinuses/Orbits: No acute finding.

Other: None.
IMPRESSION: Small area of white matter low attenuation within the right basal
ganglia, not clearly identified on the prior study. While this may
represent a small area of chronic microvascular ischemic disease,
correlation with MRI is recommended.

## 2022-05-21 ENCOUNTER — Inpatient Hospital Stay: Payer: Self-pay | Admitting: Nurse Practitioner

## 2022-06-16 ENCOUNTER — Encounter (HOSPITAL_COMMUNITY): Payer: Self-pay | Admitting: Pharmacy Technician

## 2022-06-16 ENCOUNTER — Other Ambulatory Visit: Payer: Self-pay

## 2022-06-16 ENCOUNTER — Emergency Department (HOSPITAL_COMMUNITY)
Admission: EM | Admit: 2022-06-16 | Discharge: 2022-06-16 | Disposition: A | Discharge status: Home / Self Care | Payer: Medicaid Other | Attending: Emergency Medicine | Admitting: Emergency Medicine

## 2022-06-16 ENCOUNTER — Emergency Department (HOSPITAL_COMMUNITY): Payer: Medicaid Other

## 2022-06-16 ENCOUNTER — Emergency Department (HOSPITAL_COMMUNITY)
Admission: EM | Admit: 2022-06-16 | Discharge: 2022-06-16 | Disposition: A | Discharge status: Home / Self Care | Payer: Medicaid Other | Source: Home / Self Care | Attending: Emergency Medicine | Admitting: Emergency Medicine

## 2022-06-16 DIAGNOSIS — M1610 Unilateral primary osteoarthritis, unspecified hip: Secondary | ICD-10-CM | POA: Insufficient documentation

## 2022-06-16 DIAGNOSIS — I110 Hypertensive heart disease with heart failure: Secondary | ICD-10-CM | POA: Diagnosis not present

## 2022-06-16 DIAGNOSIS — J069 Acute upper respiratory infection, unspecified: Secondary | ICD-10-CM | POA: Insufficient documentation

## 2022-06-16 DIAGNOSIS — Z794 Long term (current) use of insulin: Secondary | ICD-10-CM | POA: Insufficient documentation

## 2022-06-16 DIAGNOSIS — Z5900 Homelessness unspecified: Secondary | ICD-10-CM | POA: Insufficient documentation

## 2022-06-16 DIAGNOSIS — M25552 Pain in left hip: Secondary | ICD-10-CM | POA: Diagnosis present

## 2022-06-16 DIAGNOSIS — Z1152 Encounter for screening for COVID-19: Secondary | ICD-10-CM | POA: Diagnosis not present

## 2022-06-16 DIAGNOSIS — E119 Type 2 diabetes mellitus without complications: Secondary | ICD-10-CM | POA: Insufficient documentation

## 2022-06-16 DIAGNOSIS — I509 Heart failure, unspecified: Secondary | ICD-10-CM | POA: Diagnosis not present

## 2022-06-16 DIAGNOSIS — R059 Cough, unspecified: Secondary | ICD-10-CM | POA: Diagnosis present

## 2022-06-16 LAB — COMPREHENSIVE METABOLIC PANEL
ALT: 24 U/L (ref 0–44)
AST: 22 U/L (ref 15–41)
Albumin: 3.7 g/dL (ref 3.5–5.0)
Alkaline Phosphatase: 99 U/L (ref 38–126)
Anion gap: 12 (ref 5–15)
BUN: 14 mg/dL (ref 6–20)
CO2: 29 mmol/L (ref 22–32)
Calcium: 8.9 mg/dL (ref 8.9–10.3)
Chloride: 99 mmol/L (ref 98–111)
Creatinine, Ser: 1.2 mg/dL — ABNORMAL HIGH (ref 0.44–1.00)
GFR, Estimated: 54 mL/min — ABNORMAL LOW (ref >60)
Glucose, Bld: 144 mg/dL — ABNORMAL HIGH (ref 70–99)
Potassium: 3 mmol/L — ABNORMAL LOW (ref 3.5–5.1)
Sodium: 140 mmol/L (ref 135–145)
Total Bilirubin: 0.4 mg/dL (ref 0.3–1.2)
Total Protein: 6.2 g/dL — ABNORMAL LOW (ref 6.5–8.1)

## 2022-06-16 LAB — RESP PANEL BY RT-PCR (RSV, FLU A&B, COVID)  RVPGX2
Influenza A by PCR: NEGATIVE
Influenza B by PCR: NEGATIVE
Resp Syncytial Virus by PCR: NEGATIVE
SARS Coronavirus 2 by RT PCR: NEGATIVE

## 2022-06-16 LAB — CBC
HCT: 36.6 % (ref 36.0–46.0)
Hemoglobin: 11.2 g/dL — ABNORMAL LOW (ref 12.0–15.0)
MCH: 28.4 pg (ref 26.0–34.0)
MCHC: 30.6 g/dL (ref 30.0–36.0)
MCV: 92.9 fL (ref 80.0–100.0)
Platelets: 216 10*3/uL (ref 150–400)
RBC: 3.94 MIL/uL (ref 3.87–5.11)
RDW: 14.9 % (ref 11.5–15.5)
WBC: 5.2 10*3/uL (ref 4.0–10.5)
nRBC: 0 % (ref 0.0–0.2)

## 2022-06-16 LAB — ACETAMINOPHEN LEVEL: Acetaminophen (Tylenol), Serum: <10 ug/mL — ABNORMAL LOW (ref 10–30)

## 2022-06-16 LAB — SALICYLATE LEVEL: Salicylate Lvl: <7 mg/dL — ABNORMAL LOW (ref 7.0–30.0)

## 2022-06-16 LAB — LACTIC ACID, PLASMA: Lactic Acid, Venous: 0.8 mmol/L (ref 0.5–1.9)

## 2022-06-16 LAB — LIPASE, BLOOD: Lipase: 25 U/L (ref 11–51)

## 2022-06-16 NOTE — ED Provider Notes (Signed)
[facility] EMERGENCY DEPARTMENT Provider Note   CSN: [id number] Arrival date & time: 06/16/22  0303     History  Chief Complaint  Patient presents with   Cough    [name] [name] is a 52 y.o. female.  HPI   Patient has a history of diabetes schizoaffective disorder bipolar disorder nonischemic cardiomyopathy with an ejection fraction of approximately 15%.  Patient presented with complaints of a cough and congestion.  She has been bringing up sputum.  Patient also complains of left hip pain.  She states she fell recently.  Medical records reviewed.  Patient was recently admitted to [facility] on December 29 for respiratory failure.  Notes from that chart reviewed and.  She was being treated for CHF.  Patient does have history of poor compliance.  She also has history of bipolar with psychosis.  Home Medications Prior to Admission medications   Medication Sig Start Date End Date Taking? Authorizing Provider  ADVAIR DISKUS 250-50 MCG/ACT AEPB Inhale 1 puff into the lungs in the morning and at bedtime.    [provider]  atorvastatin (LIPITOR) 20 MG tablet Take 20 mg by mouth daily. 03/15/22 06/13/22  [provider]  blood glucose meter kit and supplies Dispense based on patient and insurance preference. Use up to four times daily as directed. (FOR ICD-10 E10.9, E11.9). 01/06/18   Rodolph Bong, MD  digoxin (LANOXIN) 0.125 MG tablet Take 1 tablet (0.125 mg total) by mouth daily. 05/18/22 06/17/22  Hughie Closs, MD  furosemide (LASIX) 40 MG tablet Take 80 mg by mouth daily as needed.    [provider]  insulin glargine (LANTUS SOLOSTAR) 100 UNIT/ML Solostar Pen Inject 20 Units into the skin in the morning. Do not take if blood sugar is <100. 10/16/20   [provider]  magnesium oxide (MAG-OX) 400 (241.3 Mg) MG tablet Take 1 tablet (400 mg total) by mouth 2 (two) times daily. 07/20/20   Pokhrel, Rebekah Chesterfield, MD  oxyCODONE-acetaminophen  (PERCOCET) 10-325 MG tablet Take 1 tablet by mouth every 4 (four) hours as needed for pain.    [provider]  potassium chloride SA (KLOR-CON M) 20 MEQ tablet Take 20 mEq by mouth daily. 01/16/22   [provider]  sertraline (ZOLOFT) 100 MG tablet Take 1.5 tablets (150 mg total) by mouth daily. Patient taking differently: Take 100 mg by mouth daily. 07/20/20   Pokhrel, Rebekah Chesterfield, MD  albuterol (PROVENTIL HFA;VENTOLIN HFA) 108 (90 Base) MCG/ACT inhaler Inhale 2 puffs into the lungs every 6 (six) hours as needed for wheezing or shortness of breath. Patient not taking: Reported on 12/13/2018 01/06/18 06/04/20  Rodolph Bong, MD  benztropine (COGENTIN) 1 MG tablet Take 1 tablet (1 mg total) by mouth 2 (two) times daily. 12/23/18 06/04/20  Malvin Johns, MD  insulin aspart (NOVOLOG) 100 UNIT/ML injection Inject 0-9 Units into the skin 3 (three) times daily with meals. Patient not taking: Reported on 06/03/2020 12/16/18 06/04/20  Lanae Boast, MD  mirtazapine (REMERON) 15 MG tablet Take 1 tablet (15 mg total) by mouth at bedtime. Patient not taking: Reported on 06/03/2020 12/22/18 06/04/20  Malvin Johns, MD      Allergies    Tramadol, Wellbutrin [bupropion], and Nicotine    Review of Systems   Review of Systems  Physical Exam Updated Vital Signs BP 110/74 (BP Location: Right Arm)   Pulse 75   Temp 98.3 F (36.8 C) (Oral)   Resp 16   SpO2 94%  Physical Exam Vitals and nursing note reviewed.  Constitutional:      General: She is not in acute distress.    Appearance: She is well-developed.  HENT:     Head: Normocephalic and atraumatic.     Right Ear: External ear normal.     Left Ear: External ear normal.  Eyes:     General: No scleral icterus.       Right eye: No discharge.        Left eye: No discharge.     Conjunctiva/sclera: Conjunctivae normal.  Neck:     Trachea: No tracheal deviation.  Cardiovascular:     Rate and Rhythm: Normal rate and regular rhythm.   Pulmonary:     Effort: Pulmonary effort is normal. No respiratory distress.     Breath sounds: Normal breath sounds. No stridor. No wheezing or rales.  Abdominal:     General: Bowel sounds are normal. There is no distension.     Palpations: Abdomen is soft.     Tenderness: There is no abdominal tenderness. There is no guarding or rebound.  Musculoskeletal:        General: Tenderness present. No deformity.     Cervical back: Neck supple.     Right lower leg: Edema present.     Left lower leg: Edema present.     Comments: Tenderness palpation left hip, no erythema or edema  Skin:    General: Skin is warm and dry.     Findings: No rash.  Neurological:     General: No focal deficit present.     Mental Status: She is alert.     Cranial Nerves: No cranial nerve deficit, dysarthria or facial asymmetry.     Sensory: No sensory deficit.     Motor: No abnormal muscle tone or seizure activity.     Coordination: Coordination normal.  Psychiatric:        Mood and Affect: Mood normal.        Behavior: Behavior is not aggressive or hyperactive.        Thought Content: Thought content does not include homicidal or suicidal ideation.     ED Results / Procedures / Treatments   Labs (all labs ordered are listed, but only abnormal results are displayed) Labs Reviewed  COMPREHENSIVE METABOLIC PANEL - Abnormal; Notable for the following components:      Result Value   Potassium 3.0 (*)    Glucose, Bld 144 (*)    Creatinine, Ser 1.20 (*)    Total Protein 6.2 (*)    GFR, Estimated 54 (*)    All other components within normal limits  CBC - Abnormal; Notable for the following components:   Hemoglobin 11.2 (*)    All other components within normal limits  ACETAMINOPHEN LEVEL - Abnormal; Notable for the following components:   Acetaminophen (Tylenol), Serum <10 (*)    All other components within normal limits  SALICYLATE LEVEL - Abnormal; Notable for the following components:   Salicylate Lvl  <8.5 (*)    All other components within normal limits  RESP PANEL BY RT-PCR (RSV, FLU A&B, COVID)  [id number]  LIPASE, BLOOD  LACTIC ACID, PLASMA  LACTIC ACID, PLASMA    EKG None  Radiology DG Chest Portable 1 View  Result Date: 06/16/2022 CLINICAL DATA:  Hypotension. EXAM: PORTABLE CHEST 1 VIEW COMPARISON:  05/15/2022. FINDINGS: The heart is mildly enlarged and mediastinal contours are within normal limits. Both lungs are clear. No acute osseous abnormality. IMPRESSION: 1. No  active disease. 2. Cardiomegaly. Electronically Signed   By: Thornell Sartorius M.D.   On: 06/16/2022 05:00   DG Foot Complete Left  Result Date: 06/16/2022 CLINICAL DATA:  Fall, with left foot and left hip pain. EXAM: DG HIP (WITH OR WITHOUT PELVIS) 2-3V LEFT; LEFT FOOT - COMPLETE 3+ VIEW COMPARISON:  None Available. FINDINGS: Left foot: No acute fracture or dislocation. Soft tissue swelling is noted over the dorsum of the forefoot. Left hip: No acute fracture or dislocation. Severe degenerative changes are present at the left hip. There are mild degenerative changes at the right hip. IMPRESSION: 1. No acute fracture at the left foot or left hip. 2. Severe degenerative changes at the left hip. Electronically Signed   By: Thornell Sartorius M.D.   On: 06/16/2022 04:34   DG Hip Unilat W or Wo Pelvis 2-3 Views Left  Result Date: 06/16/2022 CLINICAL DATA:  Fall, with left foot and left hip pain. EXAM: DG HIP (WITH OR WITHOUT PELVIS) 2-3V LEFT; LEFT FOOT - COMPLETE 3+ VIEW COMPARISON:  None Available. FINDINGS: Left foot: No acute fracture or dislocation. Soft tissue swelling is noted over the dorsum of the forefoot. Left hip: No acute fracture or dislocation. Severe degenerative changes are present at the left hip. There are mild degenerative changes at the right hip. IMPRESSION: 1. No acute fracture at the left foot or left hip. 2. Severe degenerative changes at the left hip. Electronically Signed   By: Thornell Sartorius M.D.   On: 06/16/2022  04:34    Procedures Procedures    Medications Ordered in ED Medications - No data to display  ED Course/ Medical Decision Making/ A&P Clinical Course as of 06/16/22 1009  Sun Jun 16, 2022  4235 Labs reviewed.  Lactic acid level normal lipase normal.  Metabolic panel does show slightly increased creatinine.  Salicylate and Tylenol negative.  CBC normal.  Covid flu negative [JK]  0827 Chest x-ray and foot x-ray without acute abnormalities.  Degenerative changes noted in the left hip [JK]  0903 Tylenol salicylate level negative.  Lactic acid level negative.  RSV covid test negative. [JK]  0903 Creatinine slightly increased compared to previous values.  CBC normal.   [JK]    Clinical Course User Index [JK] Linwood Dibbles, MD                           Medical Decision Making Amount and/or Complexity of Data Reviewed External Data Reviewed: labs, radiology and notes.    Details: Outpatient records reviewed.  Patient appears to get most of her health care through [facility].  Previous discharge summary echocardiogram report x-rays labs reviewed   Patient presented ED with various concerns.  Patient apparently complaining of cough as well as some hip discomfort.  Patient's x-rays do show signs of significant for degenerative changes in her hip which would account for her pain and discomfort.  There is no signs of acute infection or fracture.  Patient also complaining of a cough.  X-ray does not show pneumonia.  No findings to suggest acute CHF exacerbation.  She is breathing easily with normal oxygen saturation and clear lungs.  I doubt any acute pulmonary emergency.  COVID and influenza are also negative.  Possible viral illness causing some of her cough.  Patient noted to be hypotensive in the ED.  Will give a small fluid boluses and recheck.  Patient does have slight increase in her creatinine compared  to previous values.  Patient does have very low ejection fraction and has been  diuresed recently so we will avoid large fluid bolus.  No findings to suggest acute infection.  Patient is able to ambulate without difficulty.  I suspect her low blood pressure is related to her low EF and cardiomyopathy.  Repeat BP is normal        Final Clinical Impression(s) / ED Diagnoses Final diagnoses:  Arthritis, hip  Upper respiratory tract infection, unspecified type    Rx / DC Orders ED Discharge Orders     None         Linwood Dibbles, MD 06/16/22 2012

## 2022-06-16 NOTE — ED Notes (Signed)
Patients Aunt would like a callback with an update, [employer], [phone].

## 2022-06-16 NOTE — ED Notes (Signed)
Pt asked to use the phone to call her sister. Pt offered phone and pt refused and walked away back to triage room.

## 2022-06-16 NOTE — ED Notes (Signed)
Patient Alert and oriented to baseline. Stable and ambulatory to baseline. Patient verbalized understanding of the discharge instructions.  Patient belongings were taken by the patient.   

## 2022-06-16 NOTE — ED Notes (Signed)
Pt ambulatory out of triage room to nurses station asking for water. Pt redirected back to triage room and informed when she is cleared she will be given some water. Pt ambulatory independently back to triage room.

## 2022-06-16 NOTE — ED Provider Notes (Incomplete)
[facility] EMERGENCY DEPARTMENT Provider Note   CSN: [id number] Arrival date & time: 06/16/22  1443     History {Add pertinent medical, surgical, social history, OB history to HPI:1} No chief complaint on file.   [name] [name] is a 52 y.o. female.  HPI     Home Medications Prior to Admission medications   Medication Sig Start Date End Date Taking? Authorizing Provider  ADVAIR DISKUS 250-50 MCG/ACT AEPB Inhale 1 puff into the lungs in the morning and at bedtime.    [provider]  atorvastatin (LIPITOR) 20 MG tablet Take 20 mg by mouth daily. 03/15/22 06/13/22  [provider]  blood glucose meter kit and supplies Dispense based on patient and insurance preference. Use up to four times daily as directed. (FOR ICD-10 E10.9, E11.9). 01/06/18   Rodolph Bong, MD  digoxin (LANOXIN) 0.125 MG tablet Take 1 tablet (0.125 mg total) by mouth daily. 05/18/22 06/17/22  Hughie Closs, MD  furosemide (LASIX) 40 MG tablet Take 80 mg by mouth daily as needed.    [provider]  insulin glargine (LANTUS SOLOSTAR) 100 UNIT/ML Solostar Pen Inject 20 Units into the skin in the morning. Do not take if blood sugar is <100. 10/16/20   [provider]  magnesium oxide (MAG-OX) 400 (241.3 Mg) MG tablet Take 1 tablet (400 mg total) by mouth 2 (two) times daily. 07/20/20   Pokhrel, Rebekah Chesterfield, MD  oxyCODONE-acetaminophen (PERCOCET) 10-325 MG tablet Take 1 tablet by mouth every 4 (four) hours as needed for pain.    [provider]  potassium chloride SA (KLOR-CON M) 20 MEQ tablet Take 20 mEq by mouth daily. 01/16/22   [provider]  sertraline (ZOLOFT) 100 MG tablet Take 1.5 tablets (150 mg total) by mouth daily. Patient taking differently: Take 100 mg by mouth daily. 07/20/20   Pokhrel, Rebekah Chesterfield, MD  albuterol (PROVENTIL HFA;VENTOLIN HFA) 108 (90 Base) MCG/ACT inhaler Inhale 2 puffs into the lungs every 6 (six) hours as needed for wheezing or  shortness of breath. Patient not taking: Reported on 12/13/2018 01/06/18 06/04/20  Rodolph Bong, MD  benztropine (COGENTIN) 1 MG tablet Take 1 tablet (1 mg total) by mouth 2 (two) times daily. 12/23/18 06/04/20  Malvin Johns, MD  insulin aspart (NOVOLOG) 100 UNIT/ML injection Inject 0-9 Units into the skin 3 (three) times daily with meals. Patient not taking: Reported on 06/03/2020 12/16/18 06/04/20  Lanae Boast, MD  mirtazapine (REMERON) 15 MG tablet Take 1 tablet (15 mg total) by mouth at bedtime. Patient not taking: Reported on 06/03/2020 12/22/18 06/04/20  Malvin Johns, MD      Allergies    Tramadol, Wellbutrin [bupropion], and Nicotine    Review of Systems   Review of Systems  Physical Exam Updated Vital Signs There were no vitals taken for this visit. Physical Exam  ED Results / Procedures / Treatments   Labs (all labs ordered are listed, but only abnormal results are displayed) Labs Reviewed - No data to display  EKG None  Radiology DG Chest Portable 1 View  Result Date: 06/16/2022 CLINICAL DATA:  Hypotension. EXAM: PORTABLE CHEST 1 VIEW COMPARISON:  05/15/2022. FINDINGS: The heart is mildly enlarged and mediastinal contours are within normal limits. Both lungs are clear. No acute osseous abnormality. IMPRESSION: 1. No active disease. 2. Cardiomegaly. Electronically Signed   By: Thornell Sartorius M.D.   On: 06/16/2022 05:00   DG Foot Complete Left  Result Date: 06/16/2022 CLINICAL DATA:  Fall, with  left foot and left hip pain. EXAM: DG HIP (WITH OR WITHOUT PELVIS) 2-3V LEFT; LEFT FOOT - COMPLETE 3+ VIEW COMPARISON:  None Available. FINDINGS: Left foot: No acute fracture or dislocation. Soft tissue swelling is noted over the dorsum of the forefoot. Left hip: No acute fracture or dislocation. Severe degenerative changes are present at the left hip. There are mild degenerative changes at the right hip. IMPRESSION: 1. No acute fracture at the left foot or left hip. 2. Severe  degenerative changes at the left hip. Electronically Signed   By: Thornell Sartorius M.D.   On: 06/16/2022 04:34   DG Hip Unilat W or Wo Pelvis 2-3 Views Left  Result Date: 06/16/2022 CLINICAL DATA:  Fall, with left foot and left hip pain. EXAM: DG HIP (WITH OR WITHOUT PELVIS) 2-3V LEFT; LEFT FOOT - COMPLETE 3+ VIEW COMPARISON:  None Available. FINDINGS: Left foot: No acute fracture or dislocation. Soft tissue swelling is noted over the dorsum of the forefoot. Left hip: No acute fracture or dislocation. Severe degenerative changes are present at the left hip. There are mild degenerative changes at the right hip. IMPRESSION: 1. No acute fracture at the left foot or left hip. 2. Severe degenerative changes at the left hip. Electronically Signed   By: Thornell Sartorius M.D.   On: 06/16/2022 04:34    Procedures Procedures  {Document cardiac monitor, telemetry assessment procedure when appropriate:1}  Medications Ordered in ED Medications - No data to display  ED Course/ Medical Decision Making/ A&P                           Medical Decision Making  ***  {Document critical care time when appropriate:1} {Document review of labs and clinical decision tools ie heart score, [id number] etc:1}  {Document your independent review of radiology images, and any outside records:1} {Document your discussion with family members, caretakers, and with consultants:1} {Document social determinants of health affecting pt's care:1} {Document your decision making why or why not admission, treatments were needed:1} Final Clinical Impression(s) / ED Diagnoses Final diagnoses:  None    Rx / DC Orders ED Discharge Orders     None

## 2022-06-16 NOTE — Discharge Instructions (Signed)
Your test results in the ED were reassuring.  No signs of pneumonia.  Your blood tests were normal.  You do have arthritis in your hip that would cause the pain you experience.  Follow-up with a primary care doctor and consider seeing an orthopedic doctor

## 2022-06-16 NOTE — ED Provider Notes (Signed)
[facility] EMERGENCY DEPARTMENT Provider Note   CSN: [id number] Arrival date & time: 06/16/22  1047     History  Chief Complaint  Patient presents with   Hip Pain    [name] [name] is a 52 y.o. female with a history of DMT2, schizoaffective disorder, bipolar disorder with psychosis, nonischemic cardiomyopathy with EF of around 15% returning to the ED for continued left hip pain.  Was very recently seen in the ED for same complaint of left hip pain among other complaints, however at this time states she is only concerned about her left hip.  Denies recent or new injury.  Denies changes since prior evaluation.  Noted history of poor compliance.  The history is provided by the patient and medical records.  Hip Pain     Home Medications Prior to Admission medications   Medication Sig Start Date End Date Taking? Authorizing Provider  ADVAIR DISKUS 250-50 MCG/ACT AEPB Inhale 1 puff into the lungs in the morning and at bedtime.    [provider]  atorvastatin (LIPITOR) 20 MG tablet Take 20 mg by mouth daily. 03/15/22 06/13/22  [provider]  blood glucose meter kit and supplies Dispense based on patient and insurance preference. Use up to four times daily as directed. (FOR ICD-10 E10.9, E11.9). 01/06/18   Rodolph Bong, MD  digoxin (LANOXIN) 0.125 MG tablet Take 1 tablet (0.125 mg total) by mouth daily. 05/18/22 06/17/22  Hughie Closs, MD  furosemide (LASIX) 40 MG tablet Take 80 mg by mouth daily as needed.    [provider]  insulin glargine (LANTUS SOLOSTAR) 100 UNIT/ML Solostar Pen Inject 20 Units into the skin in the morning. Do not take if blood sugar is <100. 10/16/20   [provider]  magnesium oxide (MAG-OX) 400 (241.3 Mg) MG tablet Take 1 tablet (400 mg total) by mouth 2 (two) times daily. 07/20/20   Pokhrel, Rebekah Chesterfield, MD  oxyCODONE-acetaminophen (PERCOCET) 10-325 MG tablet Take 1 tablet by mouth every 4 (four) hours as needed  for pain.    [provider]  potassium chloride SA (KLOR-CON M) 20 MEQ tablet Take 20 mEq by mouth daily. 01/16/22   [provider]  sertraline (ZOLOFT) 100 MG tablet Take 1.5 tablets (150 mg total) by mouth daily. Patient taking differently: Take 100 mg by mouth daily. 07/20/20   Pokhrel, Rebekah Chesterfield, MD  albuterol (PROVENTIL HFA;VENTOLIN HFA) 108 (90 Base) MCG/ACT inhaler Inhale 2 puffs into the lungs every 6 (six) hours as needed for wheezing or shortness of breath. Patient not taking: Reported on 12/13/2018 01/06/18 06/04/20  Rodolph Bong, MD  benztropine (COGENTIN) 1 MG tablet Take 1 tablet (1 mg total) by mouth 2 (two) times daily. 12/23/18 06/04/20  Malvin Johns, MD  insulin aspart (NOVOLOG) 100 UNIT/ML injection Inject 0-9 Units into the skin 3 (three) times daily with meals. Patient not taking: Reported on 06/03/2020 12/16/18 06/04/20  Lanae Boast, MD  mirtazapine (REMERON) 15 MG tablet Take 1 tablet (15 mg total) by mouth at bedtime. Patient not taking: Reported on 06/03/2020 12/22/18 06/04/20  Malvin Johns, MD      Allergies    Tramadol, Wellbutrin [bupropion], and Nicotine    Review of Systems   Review of Systems  Musculoskeletal:        Left hip pain    Physical Exam Updated Vital Signs BP 137/84   Pulse 92   Temp 98.1 F (36.7 C)   Resp 17   SpO2 99%  Physical Exam Vitals and nursing note reviewed.  Constitutional:      General: She is not in acute distress.    Appearance: She is well-developed. She is not ill-appearing, toxic-appearing or diaphoretic.  HENT:     Head: Normocephalic and atraumatic.  Eyes:     General: No scleral icterus.    Conjunctiva/sclera: Conjunctivae normal.  Neck:     Comments: Very supple on exam, no meningismus or torticollis Cardiovascular:     Rate and Rhythm: Normal rate and regular rhythm.     Heart sounds: No murmur heard.    Comments: PT pulses bilaterally 2+.  CRT less than 2. Pulmonary:     Effort: Pulmonary  effort is normal. No respiratory distress.     Breath sounds: Normal breath sounds.  Abdominal:     Palpations: Abdomen is soft.     Tenderness: There is no abdominal tenderness.  Musculoskeletal:        General: No swelling.     Cervical back: Neck supple. No rigidity.     Right lower leg: No edema.     Left lower leg: No edema.     Comments: Mild tenderness to palpation of the left hip.  No erythema, edema, streaking, rash, bony deformity, leg shortening, or malrotation appreciated.  Able to bear weight without difficulty.  Skin:    General: Skin is warm and dry.     Capillary Refill: Capillary refill takes less than 2 seconds.     Coloration: Skin is not jaundiced or pale.  Neurological:     Mental Status: She is alert and oriented to person, place, and time.     Gait: Gait normal.     Comments: Patient observed walking around the triage area without assistance and without difficulty.  No ataxia or antalgic gait appreciated.  Psychiatric:        Mood and Affect: Mood normal.     ED Results / Procedures / Treatments   Labs (all labs ordered are listed, but only abnormal results are displayed) Labs Reviewed - No data to display  EKG None  Radiology DG Chest Portable 1 View  Result Date: 06/16/2022 CLINICAL DATA:  Hypotension. EXAM: PORTABLE CHEST 1 VIEW COMPARISON:  05/15/2022. FINDINGS: The heart is mildly enlarged and mediastinal contours are within normal limits. Both lungs are clear. No acute osseous abnormality. IMPRESSION: 1. No active disease. 2. Cardiomegaly. Electronically Signed   By: Brett Fairy M.D.   On: 06/16/2022 05:00   DG Foot Complete Left  Result Date: 06/16/2022 CLINICAL DATA:  Fall, with left foot and left hip pain. EXAM: DG HIP (WITH OR WITHOUT PELVIS) 2-3V LEFT; LEFT FOOT - COMPLETE 3+ VIEW COMPARISON:  None Available. FINDINGS: Left foot: No acute fracture or dislocation. Soft tissue swelling is noted over the dorsum of the forefoot. Left hip: No acute  fracture or dislocation. Severe degenerative changes are present at the left hip. There are mild degenerative changes at the right hip. IMPRESSION: 1. No acute fracture at the left foot or left hip. 2. Severe degenerative changes at the left hip. Electronically Signed   By: Brett Fairy M.D.   On: 06/16/2022 04:34   DG Hip Unilat W or Wo Pelvis 2-3 Views Left  Result Date: 06/16/2022 CLINICAL DATA:  Fall, with left foot and left hip pain. EXAM: DG HIP (WITH OR WITHOUT PELVIS) 2-3V LEFT; LEFT FOOT - COMPLETE 3+ VIEW COMPARISON:  None Available. FINDINGS: Left foot: No acute fracture or dislocation. Soft tissue swelling  is noted over the dorsum of the forefoot. Left hip: No acute fracture or dislocation. Severe degenerative changes are present at the left hip. There are mild degenerative changes at the right hip. IMPRESSION: 1. No acute fracture at the left foot or left hip. 2. Severe degenerative changes at the left hip. Electronically Signed   By: Thornell Sartorius M.D.   On: 06/16/2022 04:34    Procedures Procedures    Medications Ordered in ED Medications - No data to display  ED Course/ Medical Decision Making/ A&P                           Medical Decision Making  Patient is a 52 year old female returning to the ED for continued left hip pain.  Was evaluated in the ED immediately prior to this encounter, and upon discharge rechecked back into the emergency department.  See earlier note for details.  Pertinent findings include negative x-ray imaging for acute findings, reassuring physical exam, and reassuring labs.  Imaging did show arthritic changes in the hip of complaint.    Pt states she would like to contact her son.  Pt was offered the use of a phone to contact her son.  Pt acknowledged this and walked away.  Gait appears grossly intact, does not appear antalgic or ataxic.  Physical exam overall unremarkable.  Mild tenderness that clinically correlates with osteoarthritis.  No recent  injury or changes since discharged earlier today.  Doubt fracture, dislocation, or malrotation.  Lower extremity appears neurovascular intact.  Full ROM of joint.  Pt ambulating and able to bear weight without difficulty or assistance.  No clinical evidence of DVT.  Compartments soft, low suspicion for ischemia or compartment syndrome.  No urinary or bowel incontinence.  Afebrile.  Hemodynamically stable.  Joint not suspicious of gout or septic arthritis.  Likely arthritic in etiology.    At one point patient was witnessed squatting to a kneeling position, then transitioned to laying on her side.  Pt then stated she cannot walk.  Pt was informed she was observed with her ability to walk unhindered.  Pt then stood up from the ground without assistance or difficulty.  Offered tylenol for hip pain which patient declined.  Recommend follow up with PCP/orthopedics and continued conservative treatment.  Resources provided.  Pt in NAD and good condition at time of discharge.  Pt was provided a bus pass for transportation assistance.  After consideration the patient's encounter today, I do not feel today's workup suggests an emergent condition requiring admission or immediate intervention beyond what has been performed at this time.  Safe for discharge; instructed to return immediately for worsening symptoms, change in symptoms or any other concerns.  I have reviewed the patients home medicines and have made adjustments as needed.  Discussed course of treatment with the patient, whom demonstrated understanding.  Patient in agreement and has no further questions.    This chart was dictated using voice recognition software.  Despite best efforts to proofread,  errors can occur which can change the documentation meaning.        Final Clinical Impression(s) / ED Diagnoses Final diagnoses:  Left hip pain    Rx / DC Orders ED Discharge Orders     None         Cecil Cobbs, PA-C 06/19/22  1549    Rolan Bucco, MD 06/24/22 1020

## 2022-06-16 NOTE — ED Triage Notes (Signed)
Patient arrived with EMS from home reports productive cough with red tinged phlegm onset this week with wheezing and feet edema . CBG= 143.

## 2022-06-16 NOTE — ED Provider Triage Note (Signed)
Emergency Medicine Provider Triage Evaluation Note  [name] [name] , a 52 y.o. female  was evaluated in triage.  Pt complains of hip pain.  Thinks hip may be out of socket for the past few days.  Denies fevers.  No other complaints.  Review of Systems  Positive:  Negative: See above  Physical Exam  BP 137/84   Pulse 92   Temp 98.1 F (36.7 C)   Resp 17   SpO2 99%  Gen:   Awake, no distress   Resp:  Normal effort  MSK:   Moves extremities without difficulty  Other:  Ambulatory to treatment room.  Gait appears grossly intact.  Medical Decision Making  Medically screening exam initiated at 11:33 AM.  Appropriate orders placed.  [name] [name] was informed that the remainder of the evaluation will be completed by another provider, this initial triage assessment does not replace that evaluation, and the importance of remaining in the ED until their evaluation is complete.  Discussed patient with Dr. Tomi Bamberger, the provider patient had recently been discharged from before she rechecked in, and reviewed note.  Thorough evaluation performed of hip, without evidence of acute fracture or dislocation.  Pt hemodynamically stable.  Likely appropriate for discharge.   Prince Rome, PA-C 50/[id number] [id number]

## 2022-06-16 NOTE — ED Provider Notes (Signed)
[facility] EMERGENCY DEPARTMENT Provider Note   CSN: [id number] Arrival date & time: 06/16/22  1443     History  Chief Complaint  Patient presents with   Leg Pain   Hip Pain    [name] [name] is a 52 y.o. female returning to the ED with multiple changing complaints.  This is the patient's third time returning to the ED today for varing complaints including left hip pain.    Upon check-in, pt informed the front-desk staff of possible suicidal ideation.  However upon patient interview, patient denies suicidal ideation or intent to this provider, the triage nurse, and the security staff present.  Pt states she said this "because I wanted to come back here and call my son".    Hx of DMT2, headaches, HTN, bipolar disorder, homelessness, CHF, and known poor medical compliance.  The history is provided by the patient, medical records and the police.       Home Medications Prior to Admission medications   Medication Sig Start Date End Date Taking? Authorizing Provider  ADVAIR DISKUS 250-50 MCG/ACT AEPB Inhale 1 puff into the lungs in the morning and at bedtime.    [provider]  atorvastatin (LIPITOR) 20 MG tablet Take 20 mg by mouth daily. 03/15/22 06/13/22  [provider]  blood glucose meter kit and supplies Dispense based on patient and insurance preference. Use up to four times daily as directed. (FOR ICD-10 E10.9, E11.9). 01/06/18   Eugenie Filler, MD  digoxin (LANOXIN) 0.125 MG tablet Take 1 tablet (0.125 mg total) by mouth daily. 05/18/22 06/17/22  Darliss Cheney, MD  furosemide (LASIX) 40 MG tablet Take 80 mg by mouth daily as needed.    [provider]  insulin glargine (LANTUS SOLOSTAR) 100 UNIT/ML Solostar Pen Inject 20 Units into the skin in the morning. Do not take if blood sugar is <100. 10/16/20   [provider]  magnesium oxide (MAG-OX) 400 (241.3 Mg) MG tablet Take 1 tablet (400 mg total) by mouth 2 (two) times daily.  07/20/20   Pokhrel, Corrie Mckusick, MD  oxyCODONE-acetaminophen (PERCOCET) 10-325 MG tablet Take 1 tablet by mouth every 4 (four) hours as needed for pain.    [provider]  potassium chloride SA (KLOR-CON M) 20 MEQ tablet Take 20 mEq by mouth daily. 01/16/22   [provider]  sertraline (ZOLOFT) 100 MG tablet Take 1.5 tablets (150 mg total) by mouth daily. Patient taking differently: Take 100 mg by mouth daily. 07/20/20   Pokhrel, Corrie Mckusick, MD  albuterol (PROVENTIL HFA;VENTOLIN HFA) 108 (90 Base) MCG/ACT inhaler Inhale 2 puffs into the lungs every 6 (six) hours as needed for wheezing or shortness of breath. Patient not taking: Reported on 12/13/2018 01/06/18 06/04/20  Eugenie Filler, MD  benztropine (COGENTIN) 1 MG tablet Take 1 tablet (1 mg total) by mouth 2 (two) times daily. 12/23/18 06/04/20  Johnn Hai, MD  insulin aspart (NOVOLOG) 100 UNIT/ML injection Inject 0-9 Units into the skin 3 (three) times daily with meals. Patient not taking: Reported on 06/03/2020 12/16/18 06/04/20  Antonieta Pert, MD  mirtazapine (REMERON) 15 MG tablet Take 1 tablet (15 mg total) by mouth at bedtime. Patient not taking: Reported on 06/03/2020 12/22/18 06/04/20  Johnn Hai, MD      Allergies    Tramadol, Wellbutrin [bupropion], and Nicotine    Review of Systems   Review of Systems  Physical Exam Updated Vital Signs BP 129/72   Pulse 81   Temp 98.3  F (36.8 C)   Resp 18   SpO2 99%  Physical Exam Vitals and nursing note reviewed.  Constitutional:      General: She is not in acute distress.    Appearance: She is well-developed. She is not ill-appearing, toxic-appearing or diaphoretic.     Comments: Does not appear intoxicated or clinically dehydrated  HENT:     Head: Normocephalic and atraumatic.     Mouth/Throat:     Pharynx: Oropharynx is clear.  Eyes:     Conjunctiva/sclera: Conjunctivae normal.  Neck:     Comments: No meningismus or torticollis Cardiovascular:     Rate and Rhythm:  Normal rate and regular rhythm.     Pulses: Normal pulses.  Pulmonary:     Effort: Pulmonary effort is normal. No respiratory distress.     Breath sounds: No stridor. No wheezing.  Chest:     Chest wall: No tenderness.  Abdominal:     Palpations: Abdomen is soft.     Tenderness: There is no abdominal tenderness. There is no guarding.  Musculoskeletal:        General: No swelling.     Cervical back: Neck supple.  Skin:    General: Skin is warm and dry.     Capillary Refill: Capillary refill takes less than 2 seconds.     Coloration: Skin is not jaundiced or pale.  Neurological:     Mental Status: She is alert and oriented to person, place, and time.     Coordination: Coordination normal.     Gait: Gait normal.     Comments: Gait appears grossly intact.  Ambulating without difficulty or assistance, weight bearing without difficulty.   Psychiatric:        Mood and Affect: Mood normal.     Comments: Denies SI/HI.  Does not appear to be responding to internal stimuli.     ED Results / Procedures / Treatments   Labs (all labs ordered are listed, but only abnormal results are displayed) Labs Reviewed - No data to display  EKG None  Radiology DG Chest Portable 1 View  Result Date: 06/16/2022 CLINICAL DATA:  Hypotension. EXAM: PORTABLE CHEST 1 VIEW COMPARISON:  05/15/2022. FINDINGS: The heart is mildly enlarged and mediastinal contours are within normal limits. Both lungs are clear. No acute osseous abnormality. IMPRESSION: 1. No active disease. 2. Cardiomegaly. Electronically Signed   By: Brett Fairy M.D.   On: 06/16/2022 05:00   DG Foot Complete Left  Result Date: 06/16/2022 CLINICAL DATA:  Fall, with left foot and left hip pain. EXAM: DG HIP (WITH OR WITHOUT PELVIS) 2-3V LEFT; LEFT FOOT - COMPLETE 3+ VIEW COMPARISON:  None Available. FINDINGS: Left foot: No acute fracture or dislocation. Soft tissue swelling is noted over the dorsum of the forefoot. Left hip: No acute fracture  or dislocation. Severe degenerative changes are present at the left hip. There are mild degenerative changes at the right hip. IMPRESSION: 1. No acute fracture at the left foot or left hip. 2. Severe degenerative changes at the left hip. Electronically Signed   By: Brett Fairy M.D.   On: 06/16/2022 04:34   DG Hip Unilat W or Wo Pelvis 2-3 Views Left  Result Date: 06/16/2022 CLINICAL DATA:  Fall, with left foot and left hip pain. EXAM: DG HIP (WITH OR WITHOUT PELVIS) 2-3V LEFT; LEFT FOOT - COMPLETE 3+ VIEW COMPARISON:  None Available. FINDINGS: Left foot: No acute fracture or dislocation. Soft tissue swelling is noted over the dorsum of  the forefoot. Left hip: No acute fracture or dislocation. Severe degenerative changes are present at the left hip. There are mild degenerative changes at the right hip. IMPRESSION: 1. No acute fracture at the left foot or left hip. 2. Severe degenerative changes at the left hip. Electronically Signed   By: Thornell Sartorius M.D.   On: 06/16/2022 04:34    Procedures Procedures    Medications Ordered in ED Medications - No data to display  ED Course/ Medical Decision Making/ A&P                           Medical Decision Making  Patient is a 52 year old female with history of DMT2, headaches, HTN, bipolar disorder, homelessness, CHF, and known poor medical compliance.  Patient has returned for the third time today due to multiple consistently changing complaints.  Initially assessed for hip pain among multiple other things.  See Dr. Delford Field note for details.  No acute findings, provided resources and discharged in good condition.  Pt then immediately returned to the ED with continued hip pain and no other complaints, for which this provider personally assessed.  See separate note for details.  Remained hemodynamically stable, no emergent findings at that time.  Ambulating well without difficulty.  Requested use of phone to contact her son.  Phone was offered to  contact her son, and pt had acknowledge this and walked away.  Patient was provided bus pass and discharged in good condition.   Patient now states she initially returned to the emergency department because her left hip continues to hurt and because she was having dark thoughts.  Pt informed the front desk check in staff that she was thinking of hurting herself.  Now upon evaluation, patient states she is not suicidal, and just wants to call her son to make sure that he is okay. Pt requesting food.  Patient has denied suicidal ideation multiple times and admitted that she did not have suicidal ideation or intention anytime recently to myself, nursing staff, and Patent examiner.  Does not appear to be responding to internal stimuli.  Does not report intention to harm anyone.  Informed by nursing staff that pt had stated her son kicked her out of his house about 1 month ago and pt is homeless, went to a local homeless shelter after being discharged for the second time today and called 911 again shortly after from said location.  Pt again requesting food.  Pt witnessed cursing at nursing staff and throwing items about the triage room.  Security and [employer] requested to bedside.  Patient had a thorough workup of her left hip earlier today and was reevaluated for the same.  Again without evidence of fracture, dislocation, or acute emergency of the hip.  Patient states she believes she needs crutches to help her walk, however witnessed patient walking around the triage area in the emergency department throughout the day today without any difficulty, and as recently as 2 minutes prior to this discussion.  When these observations were discussed with the patient, she agreed.  She then shared new complaint of air burning in her lungs.  Patient denies chest pain or shortness of breath.  Pt with hx of GERD.  Patient states she believes it may have started at the bus stop.  Pt took PPI earlier today and states this feels similar  to her GERD and can occur when she hasn't eaten recently.  Patient communicating without difficulty, lungs  are CTAB with equal chest rise, does not appear to be in respiratory distress.  Again hemodynamically stable.  Pt then states this is not concerning to her, and instead feels as if she is losing her voice.  Pt suddenly began speaking softly in a whisper.  No recent URI symptoms, airway patent, and pt without facial swelling or angioedema.  Afebrile.  Discussed with patient that her voice had appeared well until this was stated.  Pt's voice then returned back to normal.  Overall physical exam unremarkable, see above.  No evidence of acute emergency on exam.  Discussed this patient with attending Dr. Tyrone Nine and Dr. Tomi Bamberger.  They are aware of this patient and today's encounters.  Due to today's events, patient history, and findings, patient does not appear to have an acute emergency at this time.  Believe patient to likely be malingering.  Patient appears hemodynamically stable and is not in NAD.  Discussed patient with triage nursing staff, attendings listed above, and cohesively with charge nurse.  After consideration of the patient's encounter today, I do not feel today's workup suggests an emergent condition requiring admission or immediate intervention beyond what has been performed at this time.  Pt provided with resources including behavioral health and homeless shelters.  Safe for discharge.    This chart was dictated using voice recognition software.  Despite best efforts to proofread,  errors can occur which can change the documentation meaning.          Final Clinical Impression(s) / ED Diagnoses Final diagnoses:  Left hip pain    Rx / DC Orders ED Discharge Orders     None         Prince Rome, PA-C [id number]    Malvin Johns, MD 06/24/22 1055

## 2022-06-16 NOTE — Discharge Instructions (Addendum)
You were seen for your hip pain.  Imaging from earlier today notes that it is not out of its socket, there is some arthritis in the area though.  Contact information for local orthopedic office has been provided for you, you may call to schedule an appointment to establish care and receive possible treatment.  You have also been provided the contact information for to PCP offices.  You may call to establish care for these places as well.  Return to the ED for new or worsening symptoms as discussed.

## 2022-06-16 NOTE — ED Triage Notes (Addendum)
Pt ambulatory to treatment room independently after being discharged due to ongoing pain. Pt states nothing changed since discharge, just wanting "a full workup". Pt informed that a full workup was performed previously and that the PA will be in to talk with her shortly.

## 2022-06-16 NOTE — ED Notes (Signed)
Patients sister called to get an update on patient, [phone], Mickel Baas

## 2022-06-16 NOTE — Discharge Instructions (Addendum)
Your evaluated in the emergency department and not found to have any acute emergencies at this time.  You have been provided resources for a local behavioral health center and to family medicine practices.  Please call to establish care with the family medicine practice.  Please seek assistance from behavioral health as needed.  You have also been provided resources for homeless shelters in the area.  Return to the ED for new or worsening symptoms as discussed.

## 2022-06-16 NOTE — ED Triage Notes (Addendum)
Pt bib [facility] with multiple complaints. On arrival to the ED pt states she wants to call her son, that she is worried something is wrong with him. Pt also requesting food because she has not eaten in 3 days. Pt intermittently cursing as staff.  [employer] at bedside during triage. Pt denies SI.

## 2022-06-16 NOTE — ED Provider Triage Note (Signed)
Emergency Medicine Provider Triage Evaluation Note  [name] [name] , a 52 y.o. female  was evaluated in triage.  Pt complains of multiple complaints.  [name] [name] is a 52 year old woman with type 2 diabetes, benign essential hypertension, schizo affect disorder, tobacco abuse, bipolar disorder with moderate depression, nonischemic cardiomyopathy and mitral regurgitation w numerous complaints. The patient is a limited historian due to medical condition and was unaccompanied in the room.   She has trouble telling me exactly why she is in the ER.   Review of Systems  Positive: *** Negative: ***  Physical Exam  There were no vitals taken for this visit. Gen:   Awake, no distress  *** Resp:  Normal effort *** MSK:   Moves extremities without difficulty *** Other:  ***  Medical Decision Making  Medically screening exam initiated at 3:36 AM.  Appropriate orders placed.  [name] [name] was informed that the remainder of the evaluation will be completed by another provider, this initial triage assessment does not replace that evaluation, and the importance of remaining in the ED until their evaluation is complete.  ***

## 2022-06-16 NOTE — ED Provider Triage Note (Addendum)
Emergency Medicine Provider Triage Evaluation Note  [name] [name] , a 52 y.o. female  was evaluated in triage.  Pt complains of multiple complaints.   Hx of type 2 diabetes, benign essential hypertension, schizo affect disorder, tobacco abuse, bipolar disorder with moderate depression, nonischemic cardiomyopathy and mitral regurgitation w numerous complaints. The patient is a limited historian due to medical condition and was unaccompanied in the room.    She has trouble telling me exactly why she is in the ER.     Review of Systems  Positive: Multiple complaints Negative: Fever   Physical Exam  BP (!) 91/59 (BP Location: Right Arm)   Pulse 87   Temp (!) 97.5 F (36.4 C)   Resp 18   SpO2 97%  Gen:   Awake, no distress   Resp:  Normal effort  MSK:   Moves extremities without difficulty  Other:    Medical Decision Making  Medically screening exam initiated at 3:48 AM.  Appropriate orders placed.  [name] [name] was informed that the remainder of the evaluation will be completed by another provider, this initial triage assessment does not replace that evaluation, and the importance of remaining in the ED until their evaluation is complete.  Labs, EKG, xrays of areas that are causing pain.   Pt is hypotensive SBP 91, on recheck is in 80s.  Will need to be moved to room .  [facility] [place] aware   Tedd Sias, PA 06/16/22 0349    Tedd Sias, PA 06/16/22 [phone]

## 2022-07-07 ENCOUNTER — Emergency Department (HOSPITAL_COMMUNITY)
Admission: EM | Admit: 2022-07-07 | Discharge: 2022-07-07 | Disposition: A | Discharge status: Home / Self Care | Payer: Medicaid Other | Attending: Emergency Medicine | Admitting: Emergency Medicine

## 2022-07-07 ENCOUNTER — Other Ambulatory Visit: Payer: Self-pay

## 2022-07-07 ENCOUNTER — Encounter (HOSPITAL_COMMUNITY): Payer: Self-pay

## 2022-07-07 ENCOUNTER — Emergency Department (HOSPITAL_COMMUNITY): Payer: Medicaid Other

## 2022-07-07 DIAGNOSIS — F17210 Nicotine dependence, cigarettes, uncomplicated: Secondary | ICD-10-CM | POA: Diagnosis not present

## 2022-07-07 DIAGNOSIS — I5023 Acute on chronic systolic (congestive) heart failure: Secondary | ICD-10-CM | POA: Insufficient documentation

## 2022-07-07 DIAGNOSIS — Z7951 Long term (current) use of inhaled steroids: Secondary | ICD-10-CM | POA: Insufficient documentation

## 2022-07-07 DIAGNOSIS — R7989 Other specified abnormal findings of blood chemistry: Secondary | ICD-10-CM | POA: Diagnosis not present

## 2022-07-07 DIAGNOSIS — Z91148 Patient's other noncompliance with medication regimen for other reason: Secondary | ICD-10-CM

## 2022-07-07 DIAGNOSIS — I110 Hypertensive heart disease with heart failure: Secondary | ICD-10-CM | POA: Diagnosis not present

## 2022-07-07 DIAGNOSIS — Z794 Long term (current) use of insulin: Secondary | ICD-10-CM | POA: Diagnosis not present

## 2022-07-07 DIAGNOSIS — R2230 Localized swelling, mass and lump, unspecified upper limb: Secondary | ICD-10-CM | POA: Insufficient documentation

## 2022-07-07 DIAGNOSIS — R051 Acute cough: Secondary | ICD-10-CM | POA: Diagnosis not present

## 2022-07-07 DIAGNOSIS — J45909 Unspecified asthma, uncomplicated: Secondary | ICD-10-CM | POA: Diagnosis not present

## 2022-07-07 DIAGNOSIS — E119 Type 2 diabetes mellitus without complications: Secondary | ICD-10-CM | POA: Diagnosis not present

## 2022-07-07 LAB — CBC WITH DIFFERENTIAL/PLATELET
Abs Immature Granulocytes: 0.01 10*3/uL (ref 0.00–0.07)
Basophils Absolute: 0 10*3/uL (ref 0.0–0.1)
Basophils Relative: 1 %
Eosinophils Absolute: 0.1 10*3/uL (ref 0.0–0.5)
Eosinophils Relative: 2 %
HCT: 42 % (ref 36.0–46.0)
Hemoglobin: 13.9 g/dL (ref 12.0–15.0)
Immature Granulocytes: 0 %
Lymphocytes Relative: 24 %
Lymphs Abs: 1.3 10*3/uL (ref 0.7–4.0)
MCH: 29.1 pg (ref 26.0–34.0)
MCHC: 33.1 g/dL (ref 30.0–36.0)
MCV: 88.1 fL (ref 80.0–100.0)
Monocytes Absolute: 0.3 10*3/uL (ref 0.1–1.0)
Monocytes Relative: 6 %
Neutro Abs: 3.5 10*3/uL (ref 1.7–7.7)
Neutrophils Relative %: 67 %
Platelets: 151 10*3/uL (ref 150–400)
RBC: 4.77 MIL/uL (ref 3.87–5.11)
RDW: 14.1 % (ref 11.5–15.5)
WBC: 5.3 10*3/uL (ref 4.0–10.5)
nRBC: 0 % (ref 0.0–0.2)

## 2022-07-07 LAB — BRAIN NATRIURETIC PEPTIDE: B Natriuretic Peptide: 442.9 pg/mL — ABNORMAL HIGH (ref 0.0–100.0)

## 2022-07-07 LAB — BASIC METABOLIC PANEL
Anion gap: 11 (ref 5–15)
BUN: 21 mg/dL — ABNORMAL HIGH (ref 6–20)
CO2: 23 mmol/L (ref 22–32)
Calcium: 9.1 mg/dL (ref 8.9–10.3)
Chloride: 105 mmol/L (ref 98–111)
Creatinine, Ser: 1.03 mg/dL — ABNORMAL HIGH (ref 0.44–1.00)
GFR, Estimated: >60 mL/min (ref >60)
Glucose, Bld: 290 mg/dL — ABNORMAL HIGH (ref 70–99)
Potassium: 3.8 mmol/L (ref 3.5–5.1)
Sodium: 139 mmol/L (ref 135–145)

## 2022-07-07 LAB — TROPONIN I (HIGH SENSITIVITY)
Troponin I (High Sensitivity): 27 ng/L — ABNORMAL HIGH (ref <18)
Troponin I (High Sensitivity): 23 ng/L — ABNORMAL HIGH (ref <18)

## 2022-07-07 MED ORDER — SERTRALINE HCL 100 MG PO TABS: 100.0000 mg | ORAL_TABLET | Freq: Every day | ORAL | 0 refills | Status: DC

## 2022-07-07 MED ORDER — GUAIFENESIN 100 MG/5ML PO LIQD: 5.0000 mL | ORAL | 0 refills | Status: DC | PRN

## 2022-07-07 MED ORDER — DOXYCYCLINE HYCLATE 100 MG PO CAPS: 100.0000 mg | ORAL_CAPSULE | Freq: Two times a day (BID) | ORAL | 0 refills | Status: DC

## 2022-07-07 MED ORDER — PREDNISONE 20 MG PO TABS: 40.0000 mg | ORAL_TABLET | Freq: Every day | ORAL | 0 refills | Status: AC

## 2022-07-07 MED ORDER — LANTUS SOLOSTAR 100 UNIT/ML ~~LOC~~ SOPN: 20.0000 [IU] | PEN_INJECTOR | Freq: Every morning | SUBCUTANEOUS | 1 refills | Status: DC

## 2022-07-07 MED ORDER — IPRATROPIUM-ALBUTEROL 0.5-2.5 (3) MG/3ML IN SOLN
3.0000 mL | Freq: Once | RESPIRATORY_TRACT | Status: AC
  Administered 2022-07-07: 3 mL via RESPIRATORY_TRACT
  Filled 2022-07-07: qty 3

## 2022-07-07 NOTE — ED Provider Triage Note (Signed)
Emergency Medicine Provider Triage Evaluation Note  GRAY MAUGERI , a 52 y.o. female  was evaluated in triage.  Pt complains of concerns for swelling to the bilateral hands.  Has a history of heart failure.  Notes that she has been compliant with her medications however she has been out of her medications recently.  Also notes chest pain.  Review of Systems  Positive:  Negative:   Physical Exam  BP 111/62   Pulse 87   Temp 98.6 F (37 C)   Resp 18   Ht 5\' 7"  (1.702 m)   Wt 81.6 kg   SpO2 92%   BMI 28.19 kg/m  Gen:   Awake, no distress   Resp:  Normal effort  MSK:   Moves extremities without difficulty  Other:  No appreciable swelling noted to BLE  Medical Decision Making  Medically screening exam initiated at 2:10 PM.  Appropriate orders placed.  AVABELLA WAILES was informed that the remainder of the evaluation will be completed by another provider, this initial triage assessment does not replace that evaluation, and the importance of remaining in the ED until their evaluation is complete.  Workup initiated   Ryley Teater A, PA-C 07/07/22 1410

## 2022-07-07 NOTE — Discharge Instructions (Addendum)
Please call with health and wellness office on Monday to schedule follow-up appointment.  It was a pleasure caring for you today in the emergency department.  Please return to the emergency department for any worsening or worrisome symptoms.  Please stop smoking cigarettes

## 2022-07-07 NOTE — ED Triage Notes (Signed)
PT BIB [facility] from the hotel she is staying out c/o feeling like she is swelling. Pt has a hx of CHF but states her hands are swelling. Pt denies any CP.

## 2022-07-07 NOTE — Care Management (Signed)
Added PCP information to AVS

## 2022-07-07 NOTE — ED Provider Notes (Signed)
[facility] [place] [facility] Provider Note  CSN: [id number] Arrival date & time: 07/07/22 6606  Chief Complaint(s) hand swelling   HPI [name] [name] is a 52 y.o. female with past medical history as below, significant for anxiety disorder, bipolar disorder, schizophrenia, systolic CHF, ischemic cardiomyopathy, dilated, tobacco abuse, homeless, adjustment disorder, DMT2 who presents to the ED with complaint of extremity swelling.  She also is concerned over increased phlegm production and a nonproductive cough.  Requesting cigarettes.  Patient was previously admitted twice in December secondary to heart failure complications, once here, later in December at [facility].  Patient currently lives in a hotel and her son helps take care of her.  She has variable compliance with her medications, unsure of medication she is supposed to take.  Requesting refills on her "sleepy time" medications and on her insulin.  She had a televisit 1/9 with PA hauler  Past Medical History Past Medical History:  Diagnosis Date   Anxiety disorder    Asthma    Bipolar affective disorder ([place])    Depression    Diabetes mellitus    Migraine    Schizophrenia ([place])    Patient Active Problem List   Diagnosis Date Noted   Acute on chronic systolic CHF (congestive heart failure) ([place]) 05/15/2022   Acute pyelonephritis 07/17/2020   Homelessness 06/12/2020   Bipolar 1 disorder ([place]) 12/18/2018   Suicide attempt ([place])    Overdose, intentional self-harm, initial encounter ([place]) 12/13/2018   Bipolar I disorder, single manic episode, severe, with psychosis ([place]) 06/09/2018   Bipolar disorder ([place]) 06/09/2018   DKA, type 2 ([place]) 01/06/2018   Diabetic acidosis without coma ([place])    Adjustment disorder with depressed mood 01/04/2018   Oral thrush    Acute lower UTI    DKA, type 2, not at goal [facility]) 01/03/2018   Thrush 01/03/2018   DM (diabetes mellitus), type 2, uncontrolled 06/07/2017    Abscess of groin, right 06/07/2017   Abscess 06/07/2017   Bipolar disorder with moderate depression ([place]) 08/31/2015   Tobacco use disorder 08/28/2015   HTN (hypertension) 08/28/2015   Diabetes ([place]) 08/28/2015   Hyponatremia 08/28/2015   T2DM (type 2 diabetes mellitus) ([place]) 10/26/2009   OBESITY 10/26/2009   Essential hypertension 10/26/2009   DISTURBANCE OF SKIN SENSATION 10/26/2009   Migraine headache 03/05/2007   Home Medication(s) Prior to Admission medications   Medication Sig Start Date End Date Taking? Authorizing Provider  doxycycline (VIBRAMYCIN) 100 MG capsule Take 1 capsule (100 mg total) by mouth 2 (two) times daily. 07/07/22  Yes Wynona Dove A, DO  guaiFENesin (ROBITUSSIN) 100 MG/5ML liquid Take 5 mLs by mouth every 4 (four) hours as needed for cough or to loosen phlegm. 07/07/22  Yes Wynona Dove A, DO  predniSONE (DELTASONE) 20 MG tablet Take 2 tablets (40 mg total) by mouth daily for 5 days. 07/07/22 07/12/22 Yes Wynona Dove A, DO  ADVAIR DISKUS 250-50 MCG/ACT AEPB Inhale 1 puff into the lungs in the morning and at bedtime.    [provider]  atorvastatin (LIPITOR) 20 MG tablet Take 20 mg by mouth daily. 03/15/22 06/13/22  [provider]  blood glucose meter kit and supplies Dispense based on patient and insurance preference. Use up to four times daily as directed. (FOR ICD-10 E10.9, E11.9). 01/06/18   Eugenie Filler, MD  digoxin (LANOXIN) 0.125 MG tablet Take 1 tablet (0.125 mg total) by mouth daily. 05/18/22 06/17/22  Darliss Cheney, MD  furosemide (LASIX)  40 MG tablet Take 80 mg by mouth daily as needed.    [provider]  insulin glargine (LANTUS SOLOSTAR) 100 UNIT/ML Solostar Pen Inject 20 Units into the skin in the morning. Do not take if blood sugar is <100. 07/07/22   Tanda Rockers A, DO  magnesium oxide (MAG-OX) 400 (241.3 Mg) MG tablet Take 1 tablet (400 mg total) by mouth 2 (two) times daily. 07/20/20   Pokhrel, Rebekah [place], MD   oxyCODONE-acetaminophen (PERCOCET) 10-325 MG tablet Take 1 tablet by mouth every 4 (four) hours as needed for pain.    [provider]  potassium chloride SA (KLOR-CON M) 20 MEQ tablet Take 20 mEq by mouth daily. 01/16/22   [provider]  sertraline (ZOLOFT) 100 MG tablet Take 1 tablet (100 mg total) by mouth daily for 14 days. 07/07/22 07/21/22  Sloan Leiter, DO  albuterol (PROVENTIL HFA;VENTOLIN HFA) 108 (90 Base) MCG/ACT inhaler Inhale 2 puffs into the lungs every 6 (six) hours as needed for wheezing or shortness of breath. Patient not taking: Reported on 12/13/2018 01/06/18 06/04/20  Rodolph Bong, MD  benztropine (COGENTIN) 1 MG tablet Take 1 tablet (1 mg total) by mouth 2 (two) times daily. 12/23/18 06/04/20  Malvin Johns, MD  insulin aspart (NOVOLOG) 100 UNIT/ML injection Inject 0-9 Units into the skin 3 (three) times daily with meals. Patient not taking: Reported on 06/03/2020 12/16/18 06/04/20  Lanae Boast, MD  mirtazapine (REMERON) 15 MG tablet Take 1 tablet (15 mg total) by mouth at bedtime. Patient not taking: Reported on 06/03/2020 12/22/18 06/04/20  Malvin Johns, MD                                                                                                                                    Past Surgical History Past Surgical History:  Procedure Laterality Date   KNEE ARTHROSCOPY     x 2   KNEE SURGERY Left    Incision made and knee cleaned out   TUBAL LIGATION     Family History Family History  Problem Relation Age of Onset   Bipolar disorder Mother    Hypertension Father    Diabetes Father     Social History Social History   Tobacco Use   Smoking status: Every Day    Packs/day: 1.00    Years: 29.00    Total pack years: 29.00    Types: Cigarettes   Smokeless tobacco: Never  Vaping Use   Vaping Use: Former  Substance Use Topics   Alcohol use: Not Currently    Comment: occasionally   Drug use: No   Allergies Tramadol, Wellbutrin  [bupropion], and Nicotine  Review of Systems Review of Systems  Constitutional:  Negative for activity change and fever.  HENT:  Negative for facial swelling and trouble swallowing.   Eyes:  Negative for discharge and redness.  Respiratory:  Positive for cough. Negative for shortness of breath.  Cardiovascular:  Negative for chest pain and palpitations.  Gastrointestinal:  Positive for nausea. Negative for abdominal pain.  Genitourinary:  Negative for dysuria and flank pain.  Musculoskeletal:  Negative for back pain and gait problem.       "Swelling"  Skin:  Negative for pallor and rash.  Neurological:  Negative for syncope and headaches.    Physical Exam Vital Signs  I have reviewed the triage vital signs BP 109/78   Pulse 87   Temp 98.6 F (37 C)   Resp 16   Ht 5\' 7"  (1.702 m)   Wt 81.6 kg   SpO2 95%   BMI 28.19 kg/m  Physical Exam Vitals and nursing note reviewed.  Constitutional:      General: She is not in acute distress.    Appearance: Normal appearance.  HENT:     Head: Normocephalic and atraumatic. No raccoon eyes, Battle's sign, right periorbital erythema or left periorbital erythema.     Right Ear: External ear normal.     Left Ear: External ear normal.     Nose: Nose normal.     Mouth/Throat:     Mouth: Mucous membranes are moist.  Eyes:     General: No scleral icterus.       Right eye: No discharge.        Left eye: No discharge.  Cardiovascular:     Rate and Rhythm: Normal rate and regular rhythm.     Pulses: Normal pulses.     Heart sounds: Normal heart sounds.  Pulmonary:     Effort: Pulmonary effort is normal. No respiratory distress.     Breath sounds: Normal breath sounds.  Abdominal:     General: Abdomen is flat.     Tenderness: There is no abdominal tenderness.  Musculoskeletal:     Cervical back: No rigidity.     Right lower leg: No edema.     Left lower leg: No edema.  Skin:    General: Skin is warm and dry.     Capillary Refill:  Capillary refill takes less than 2 seconds.  Neurological:     Mental Status: She is alert and oriented to person, place, and time.     GCS: GCS eye subscore is 4. GCS verbal subscore is 5. GCS motor subscore is 6.  Psychiatric:        Mood and Affect: Mood normal.        Behavior: Behavior normal.     ED Results and Treatments Labs (all labs ordered are listed, but only abnormal results are displayed) Labs Reviewed  BASIC METABOLIC PANEL - Abnormal; Notable for the following components:      Result Value   Glucose, Bld 290 (*)    BUN 21 (*)    Creatinine, Ser 1.03 (*)    All other components within normal limits  BRAIN NATRIURETIC PEPTIDE - Abnormal; Notable for the following components:   B Natriuretic Peptide 442.9 (*)    All other components within normal limits  TROPONIN I (HIGH SENSITIVITY) - Abnormal; Notable for the following components:   Troponin I (High Sensitivity) 27 (*)    All other components within normal limits  TROPONIN I (HIGH SENSITIVITY) - Abnormal; Notable for the following components:   Troponin I (High Sensitivity) 23 (*)    All other components within normal limits  RESP PANEL BY RT-PCR (RSV, FLU A&B, COVID)  RVPGX2  CBC WITH DIFFERENTIAL/PLATELET  Radiology DG Chest 2 View  Result Date: 07/07/2022 CLINICAL DATA:  Shortness of breath EXAM: CHEST - 2 VIEW COMPARISON:  06/16/2022 FINDINGS: Stable cardiomegaly. No focal airspace consolidation, pleural effusion, or pneumothorax. No significant bony abnormality. IMPRESSION: Cardiomegaly without acute cardiopulmonary disease. Electronically Signed   By: Duanne Guess D.O.   On: 07/07/2022 10:42    Pertinent labs & imaging results that were available during my care of the patient were reviewed by me and considered in my medical decision making (see MDM for details).  Medications Ordered  in ED Medications  ipratropium-albuterol (DUONEB) 0.5-2.5 (3) MG/3ML nebulizer solution 3 mL (3 mLs Nebulization Given 07/07/22 1358)                                                                                                                                     Procedures Procedures  (including critical care time)  Medical Decision Making / ED Course   MDM:  KIMBERLIE CSASZAR is a 52 y.o. female with past medical history as below, significant for anxiety disorder, bipolar disorder, schizophrenia, systolic CHF, homeless, adjustment disorder, DMT2 who presents to the ED with complaint of extremity swelling.. The complaint involves an extensive differential diagnosis and also carries with it a high risk of complications and morbidity.  Serious etiology was considered. Ddx includes but is not limited to: Fluid overload, CHF, thyroid abnormality, metabolic derangement, endocrine derangement, etc.  On initial assessment the patient is: Resting comfortably in ambulatory, HDS, no hypoxia, no respiratory distress. Vital signs and nursing notes were reviewed  Clinical Course as of 07/07/22 2245  Sun Jul 07, 2022  1422 Troponin mildly elevated, downtrending, she has no chest pain.  Similar to baseline.  Favor demand ischemia secondary to heart failure. [SG]    Clinical Course User Index [SG] Sloan Leiter, DO   Patient with myriad of complaints today, initially presented for swelling to her hands but on my assessment hands do not appear swollen, no swelling to her lower extremities.  She is breathing complaint ambient air, does not appear to be in fluid overload.  Patient is requesting refills of all of her medications of is not sure which medication injection needs refills on.  I reviewed her medication from recent discharge and provided refills of some of the medication that she was out of as she brought her medications with her in a garbage bag.  She has multiple social concerns that seem to be  the primary concern of her visit today.  It seems she is cared for by her son and she is living in a hotel.  Spoke with social work and she will call clinic on Monday for follow-up alert.  [facility] patient feels that that facility is too far away and she would like a PCP that is closer to where she lives.  Patient is also concerned that her son does not provide  her cigarettes frequently enough and she would like me to call her son to see if he can bring her more cigarettes that she can smoke on the way home.  I recommended that she refrain from tobacco use.  Also provided patient with resource sheets in regards to transportation sources  In regards to her lab work her BNP is mildly elevated, does not appear to be fluid overloaded on exam.  Troponin is mildly elevated but flat.  She has no chest pain or dyspnea.  Does have a slight cough and feels that she is producing more mucus than normal, she has history of COPD, will give her steroids and albuterol inhaler for home.  Started on doxycycline for possible COPD exacerbation this appears mild.  Given antitussive and refills on her insulin and Zoloft for home.  Advised her to follow-up with PCP for further evaluation of her chronic conditions.  The patient improved significantly and was discharged in stable condition. Detailed discussions were had with the patient regarding current findings, and need for close f/u with PCP or on call doctor. The patient has been instructed to return immediately if the symptoms worsen in any way for re-evaluation. Patient verbalized understanding and is in agreement with current care plan. All questions answered prior to discharge.     Additional history obtained: -Additional history obtained from na -External records from outside source obtained and reviewed including: Chart review including previous notes, labs, imaging, consultation notes including her ED visits, prior labs and imaging,  home medications OSH records   Lab Tests: -I ordered, reviewed, and interpreted labs.   The pertinent results include:   Labs Reviewed  BASIC METABOLIC PANEL - Abnormal; Notable for the following components:      Result Value   Glucose, Bld 290 (*)    BUN 21 (*)    Creatinine, Ser 1.03 (*)    All other components within normal limits  BRAIN NATRIURETIC PEPTIDE - Abnormal; Notable for the following components:   B Natriuretic Peptide 442.9 (*)    All other components within normal limits  TROPONIN I (HIGH SENSITIVITY) - Abnormal; Notable for the following components:   Troponin I (High Sensitivity) 27 (*)    All other components within normal limits  TROPONIN I (HIGH SENSITIVITY) - Abnormal; Notable for the following components:   Troponin I (High Sensitivity) 23 (*)    All other components within normal limits  RESP PANEL BY RT-PCR (RSV, FLU A&B, COVID)  RVPGX2  CBC WITH DIFFERENTIAL/PLATELET    Notable for as above  EKG   EKG Interpretation  Date/Time:  Sunday July 07 2022 15:15:06 EST Ventricular Rate:  94 PR Interval:  175 QRS Duration: 105 QT Interval:  409 QTC Calculation: 512 R Axis:   -29 Text Interpretation: Sinus rhythm Consider right atrial enlargement Left ventricular hypertrophy Anterior Q waves, possibly due to LVH similar to prior, no stemi Confirmed by Tanda Rockers ([id number]) on 07/07/2022 10:45:05 PM         Imaging Studies ordered: I ordered imaging studies including cxr I independently visualized the following imaging with scope of interpretation limited to determining acute life threatening conditions related to emergency care: cardiomegaly on cxr I independently visualized and interpreted imaging. I agree with the radiologist interpretation   Medicines ordered and prescription drug management: Meds ordered this encounter  Medications   ipratropium-albuterol (DUONEB) 0.5-2.5 (3) MG/3ML nebulizer solution 3 mL   doxycycline (VIBRAMYCIN) 100 MG  capsule    Sig: Take 1 capsule (100  mg total) by mouth 2 (two) times daily.    Dispense:  20 capsule    Refill:  0   predniSONE (DELTASONE) 20 MG tablet    Sig: Take 2 tablets (40 mg total) by mouth daily for 5 days.    Dispense:  10 tablet    Refill:  0   guaiFENesin (ROBITUSSIN) 100 MG/5ML liquid    Sig: Take 5 mLs by mouth every 4 (four) hours as needed for cough or to loosen phlegm.    Dispense:  120 mL    Refill:  0   insulin glargine (LANTUS SOLOSTAR) 100 UNIT/ML Solostar Pen    Sig: Inject 20 Units into the skin in the morning. Do not take if blood sugar is <100.    Dispense:  15 mL    Refill:  1   sertraline (ZOLOFT) 100 MG tablet    Sig: Take 1 tablet (100 mg total) by mouth daily for 14 days.    Dispense:  14 tablet    Refill:  0    -I have reviewed the patients home medicines and have made adjustments as needed   Consultations Obtained: I requested consultation with the toc   Cardiac Monitoring: The patient was maintained on a cardiac monitor.  I personally viewed and interpreted the cardiac monitored which showed an underlying rhythm of: NSR  Social Determinants of Health:  Diagnosis or treatment significantly limited by social determinants of health: current smoker Counseled patient for approximately 3 minutes regarding smoking cessation. Discussed risks of smoking and how they applied and affected their visit here today. Patient not ready to quit at this time, however will follow up with their primary doctor when they are.   CPT code: 79150: intermediate counseling for smoking cessation     Reevaluation: After the interventions noted above, I reevaluated the patient and found that they have resolved  Co morbidities that complicate the patient evaluation  Past Medical History:  Diagnosis Date   Anxiety disorder    Asthma    Bipolar affective disorder (HCC)    Depression    Diabetes mellitus    Migraine    Schizophrenia ([facility])        Dispostion: Disposition decision including need for hospitalization was considered, and patient discharged from emergency department.    Final Clinical Impression(s) / ED Diagnoses Final diagnoses:  Acute cough  Noncompliance with medications     This chart was dictated using voice recognition software.  Despite best efforts to proofread,  errors can occur which can change the documentation meaning.       Sloan Leiter, DO 07/07/22 2246

## 2022-07-21 ENCOUNTER — Other Ambulatory Visit: Payer: Self-pay

## 2022-07-21 ENCOUNTER — Emergency Department (HOSPITAL_COMMUNITY)
Admission: EM | Admit: 2022-07-21 | Discharge: 2022-07-22 | Disposition: A | Discharge status: Home / Self Care | Payer: Medicaid Other | Attending: Emergency Medicine | Admitting: Emergency Medicine

## 2022-07-21 DIAGNOSIS — E1165 Type 2 diabetes mellitus with hyperglycemia: Secondary | ICD-10-CM | POA: Insufficient documentation

## 2022-07-21 DIAGNOSIS — R109 Unspecified abdominal pain: Secondary | ICD-10-CM | POA: Diagnosis present

## 2022-07-21 DIAGNOSIS — Z7901 Long term (current) use of anticoagulants: Secondary | ICD-10-CM | POA: Diagnosis not present

## 2022-07-21 DIAGNOSIS — I509 Heart failure, unspecified: Secondary | ICD-10-CM | POA: Diagnosis not present

## 2022-07-21 DIAGNOSIS — J45909 Unspecified asthma, uncomplicated: Secondary | ICD-10-CM | POA: Diagnosis not present

## 2022-07-21 DIAGNOSIS — Z794 Long term (current) use of insulin: Secondary | ICD-10-CM | POA: Insufficient documentation

## 2022-07-21 DIAGNOSIS — F17210 Nicotine dependence, cigarettes, uncomplicated: Secondary | ICD-10-CM | POA: Insufficient documentation

## 2022-07-21 DIAGNOSIS — M25552 Pain in left hip: Secondary | ICD-10-CM | POA: Insufficient documentation

## 2022-07-21 DIAGNOSIS — Z20822 Contact with and (suspected) exposure to covid-19: Secondary | ICD-10-CM | POA: Diagnosis not present

## 2022-07-21 DIAGNOSIS — R739 Hyperglycemia, unspecified: Secondary | ICD-10-CM

## 2022-07-21 DIAGNOSIS — K5900 Constipation, unspecified: Secondary | ICD-10-CM

## 2022-07-21 DIAGNOSIS — M25562 Pain in left knee: Secondary | ICD-10-CM | POA: Diagnosis not present

## 2022-07-21 LAB — COMPREHENSIVE METABOLIC PANEL
ALT: 16 U/L (ref 0–44)
AST: 18 U/L (ref 15–41)
Albumin: 4.2 g/dL (ref 3.5–5.0)
Alkaline Phosphatase: 128 U/L — ABNORMAL HIGH (ref 38–126)
Anion gap: 10 (ref 5–15)
BUN: 36 mg/dL — ABNORMAL HIGH (ref 6–20)
CO2: 23 mmol/L (ref 22–32)
Calcium: 8.4 mg/dL — ABNORMAL LOW (ref 8.9–10.3)
Chloride: 101 mmol/L (ref 98–111)
Creatinine, Ser: 1.31 mg/dL — ABNORMAL HIGH (ref 0.44–1.00)
GFR, Estimated: 49 mL/min — ABNORMAL LOW (ref >60)
Glucose, Bld: 300 mg/dL — ABNORMAL HIGH (ref 70–99)
Potassium: 3.4 mmol/L — ABNORMAL LOW (ref 3.5–5.1)
Sodium: 134 mmol/L — ABNORMAL LOW (ref 135–145)
Total Bilirubin: 0.5 mg/dL (ref 0.3–1.2)
Total Protein: 7 g/dL (ref 6.5–8.1)

## 2022-07-21 LAB — CBC WITH DIFFERENTIAL/PLATELET
Abs Immature Granulocytes: 0.02 10*3/uL (ref 0.00–0.07)
Basophils Absolute: 0.1 10*3/uL (ref 0.0–0.1)
Basophils Relative: 1 %
Eosinophils Absolute: 0.1 10*3/uL (ref 0.0–0.5)
Eosinophils Relative: 1 %
HCT: 39.6 % (ref 36.0–46.0)
Hemoglobin: 12.9 g/dL (ref 12.0–15.0)
Immature Granulocytes: 0 %
Lymphocytes Relative: 17 %
Lymphs Abs: 1.5 10*3/uL (ref 0.7–4.0)
MCH: 28 pg (ref 26.0–34.0)
MCHC: 32.6 g/dL (ref 30.0–36.0)
MCV: 86.1 fL (ref 80.0–100.0)
Monocytes Absolute: 0.5 10*3/uL (ref 0.1–1.0)
Monocytes Relative: 6 %
Neutro Abs: 6.3 10*3/uL (ref 1.7–7.7)
Neutrophils Relative %: 75 %
Platelets: 171 10*3/uL (ref 150–400)
RBC: 4.6 MIL/uL (ref 3.87–5.11)
RDW: 14.4 % (ref 11.5–15.5)
WBC: 8.4 10*3/uL (ref 4.0–10.5)
nRBC: 0 % (ref 0.0–0.2)

## 2022-07-21 LAB — URINALYSIS, ROUTINE W REFLEX MICROSCOPIC
Bilirubin Urine: NEGATIVE
Glucose, UA: 150 mg/dL — AB
Hgb urine dipstick: NEGATIVE
Ketones, ur: NEGATIVE mg/dL
Nitrite: NEGATIVE
Protein, ur: NEGATIVE mg/dL
Specific Gravity, Urine: 1.016 (ref 1.005–1.030)
pH: 5 (ref 5.0–8.0)

## 2022-07-21 LAB — RESP PANEL BY RT-PCR (RSV, FLU A&B, COVID)  RVPGX2
Influenza A by PCR: NEGATIVE
Influenza B by PCR: NEGATIVE
Resp Syncytial Virus by PCR: NEGATIVE
SARS Coronavirus 2 by RT PCR: NEGATIVE

## 2022-07-21 LAB — HCG, QUANTITATIVE, PREGNANCY: hCG, Beta Chain, Quant, S: 4 m[IU]/mL (ref <5)

## 2022-07-21 LAB — CBG MONITORING, ED: Glucose-Capillary: 339 mg/dL — ABNORMAL HIGH (ref 70–99)

## 2022-07-21 MED ORDER — ACETAMINOPHEN 325 MG PO TABS
650.0000 mg | ORAL_TABLET | Freq: Once | ORAL | Status: AC
  Administered 2022-07-22: 650 mg via ORAL
  Filled 2022-07-21: qty 2

## 2022-07-21 NOTE — ED Triage Notes (Signed)
Coming in due to her blood sugar being high. Denies N/V

## 2022-07-21 NOTE — ED Provider Triage Note (Signed)
Emergency Medicine Provider Triage Evaluation Note  JACLYN HAYRE , a 52 y.o. female  was evaluated in triage.  Pt complains of sleeping outside in the rain and didn't want to do this again. Then she told EMS that she thinks her blood sugar is high and her blood pressure is high and that she is running a fever. Has not had her DM medications in "a while". She reports that "she hurts all over".  Review of Systems  Positive:  Negative:   Physical Exam  Ht 5' 7"$  (1.702 m)   Wt 82 kg   BMI 28.31 kg/m  Gen:   Awake, no distress   Resp:  Normal effort  MSK:   Moves extremities without difficulty  Other:    Medical Decision Making  Medically screening exam initiated at 10:08 PM.  Appropriate orders placed.  ANTONYA CARMER was informed that the remainder of the evaluation will be completed by another provider, this initial triage assessment does not replace that evaluation, and the importance of remaining in the ED until their evaluation is complete.  Labs ordered   Sherrell Puller, [place] 07/21/22 2222

## 2022-07-22 ENCOUNTER — Emergency Department (HOSPITAL_COMMUNITY): Payer: Medicaid Other

## 2022-07-22 ENCOUNTER — Encounter (HOSPITAL_COMMUNITY): Payer: Self-pay

## 2022-07-22 LAB — CBG MONITORING, ED: Glucose-Capillary: 121 mg/dL — ABNORMAL HIGH (ref 70–99)

## 2022-07-22 LAB — DIGOXIN LEVEL: Digoxin Level: <0.2 ng/mL — ABNORMAL LOW (ref 0.8–2.0)

## 2022-07-22 MED ORDER — IOHEXOL 300 MG/ML  SOLN
80.0000 mL | Freq: Once | INTRAMUSCULAR | Status: AC | PRN
  Administered 2022-07-22: 80 mL via INTRAVENOUS

## 2022-07-22 MED ORDER — FENTANYL CITRATE PF 50 MCG/ML IJ SOSY
100.0000 ug | PREFILLED_SYRINGE | Freq: Once | INTRAMUSCULAR | Status: AC
  Administered 2022-07-22: 100 ug via INTRAVENOUS
  Filled 2022-07-22: qty 2

## 2022-07-22 MED ORDER — ONDANSETRON HCL 4 MG/2ML IJ SOLN
4.0000 mg | Freq: Once | INTRAMUSCULAR | Status: AC
  Administered 2022-07-22: 4 mg via INTRAVENOUS
  Filled 2022-07-22: qty 2

## 2022-07-22 MED ORDER — INSULIN ASPART 100 UNIT/ML IJ SOLN
10.0000 [IU] | Freq: Once | INTRAMUSCULAR | Status: AC
  Administered 2022-07-22: 10 [IU] via SUBCUTANEOUS
  Filled 2022-07-22: qty 0.1

## 2022-07-22 NOTE — ED Provider Notes (Signed)
[facility] DEPT Provider Note: Georgena Spurling, MD, FACEP  CSN: [id number] MRN: [id number] ARRIVAL: 07/21/22 at 2200 ROOM: [place]  Hyperglycemia   HISTORY OF PRESENT ILLNESS  07/22/22 2:17 AM [name] [name] is a 52 y.o. female with a history of heart failure and ejection fraction less than 20%.  She states she has been told she has only a year to live.  She is here now because she noted her blood sugar to be elevated and she has been having bilateral flank pain and lower abdominal cramping.  The pain is moderate and she describes it as feeling like menstrual cramps even though she is postmenopausal.  She has been staying in a homeless shelter but states she has tried to stay compliant with her medications.  She is also having pain in her left hip and left knee which are chronic due to known orthopedic problems.  Her initial blood pressure in triage was 71/52 but on recheck is 122/76.  She is not currently short of breath nor is she having chest pain.   Past Medical History:  Diagnosis Date   Anxiety disorder    Asthma    Bipolar affective disorder ([place])    Depression    Diabetes mellitus    Migraine    Schizophrenia ([place])     Past Surgical History:  Procedure Laterality Date   KNEE ARTHROSCOPY     x 2   KNEE SURGERY Left    Incision made and knee cleaned out   TUBAL LIGATION      Family History  Problem Relation Age of Onset   Bipolar disorder Mother    Hypertension Father    Diabetes Father     Social History   Tobacco Use   Smoking status: Every Day    Packs/day: 1.00    Years: 29.00    Total pack years: 29.00    Types: Cigarettes   Smokeless tobacco: Never  Vaping Use   Vaping Use: Former  Substance Use Topics   Alcohol use: Not Currently    Comment: occasionally   Drug use: No    Prior to Admission medications   Medication Sig Start Date End Date Taking? Authorizing Provider  apixaban (ELIQUIS) 5 MG TABS tablet Take 5 mg  by mouth 2 (two) times daily.   Yes [provider]  atorvastatin (LIPITOR) 20 MG tablet Take 20 mg by mouth daily.   Yes [provider]  bumetanide (BUMEX) 2 MG tablet Take 2 mg by mouth 2 (two) times daily.   Yes [provider]  guaiFENesin (ROBITUSSIN) 100 MG/5ML liquid Take 5 mLs by mouth every 4 (four) hours as needed for cough or to loosen phlegm. 07/07/22  Yes Wynona Dove A, DO  isosorbide mononitrate (IMDUR) 60 MG 24 hr tablet Take 60 mg by mouth daily.   Yes [provider]  metoprolol succinate (TOPROL-XL) 25 MG 24 hr tablet Take 25 mg by mouth daily.   Yes [provider]  mirtazapine (REMERON) 30 MG tablet Take 30 mg by mouth at bedtime.   Yes [provider]  risperiDONE (RISPERDAL) 1 MG tablet Take 1 mg by mouth at bedtime.   Yes [provider]  sertraline (ZOLOFT) 100 MG tablet Take 100 mg by mouth daily.   Yes [provider]  traZODone (DESYREL) 50 MG tablet Take 100 mg by mouth at bedtime.   Yes [provider]  atorvastatin (LIPITOR) 20 MG tablet Take 20 mg  by mouth daily. 03/15/22 06/13/22  [provider]  blood glucose meter kit and supplies Dispense based on patient and insurance preference. Use up to four times daily as directed. (FOR ICD-10 E10.9, E11.9). 01/06/18   Eugenie Filler, MD  digoxin (LANOXIN) 0.125 MG tablet Take 1 tablet (0.125 mg total) by mouth daily. 05/18/22 06/17/22  Darliss Cheney, MD  doxycycline (VIBRAMYCIN) 100 MG capsule Take 1 capsule (100 mg total) by mouth 2 (two) times daily. 07/07/22   Jeanell Sparrow, DO  furosemide (LASIX) 40 MG tablet Take 80 mg by mouth daily.    [provider]  insulin glargine (LANTUS SOLOSTAR) 100 UNIT/ML Solostar Pen Inject 20 Units into the skin in the morning. Do not take if blood sugar is <100. 07/07/22   Wynona Dove A, DO  magnesium oxide (MAG-OX) 400 (241.3 Mg) MG tablet Take 1 tablet (400 mg total) by mouth 2 (two) times  daily. Patient taking differently: Take 400 mg by mouth daily. 07/20/20   Pokhrel, Corrie Mckusick, MD  oxyCODONE-acetaminophen (PERCOCET) 10-325 MG tablet Take 1 tablet by mouth every 4 (four) hours as needed for pain.    [provider]  potassium chloride SA (KLOR-CON M) 20 MEQ tablet Take 20 mEq by mouth daily. 01/16/22   [provider]  sertraline (ZOLOFT) 100 MG tablet Take 1 tablet (100 mg total) by mouth daily for 14 days. 07/07/22 07/21/22  Jeanell Sparrow, DO  albuterol (PROVENTIL HFA;VENTOLIN HFA) 108 (90 Base) MCG/ACT inhaler Inhale 2 puffs into the lungs every 6 (six) hours as needed for wheezing or shortness of breath. Patient not taking: Reported on 12/13/2018 01/06/18 06/04/20  Eugenie Filler, MD  benztropine (COGENTIN) 1 MG tablet Take 1 tablet (1 mg total) by mouth 2 (two) times daily. 12/23/18 06/04/20  Johnn Hai, MD  insulin aspart (NOVOLOG) 100 UNIT/ML injection Inject 0-9 Units into the skin 3 (three) times daily with meals. Patient not taking: Reported on 06/03/2020 12/16/18 06/04/20  Antonieta Pert, MD    Allergies Tramadol, Wellbutrin [bupropion], and Nicotine   REVIEW OF SYSTEMS  Negative except as noted here or in the History of Present Illness.   PHYSICAL EXAMINATION  Initial Vital Signs Blood pressure (!) 71/52, pulse 95, temperature 99.2 F (37.3 C), temperature source Oral, resp. rate 18, height 5' 7"$  (1.702 m), weight 81.6 kg, SpO2 99 %.  Examination General: Well-developed, well-nourished female in no acute distress; appearance consistent with age of record HENT: normocephalic; atraumatic Eyes: Appearance Neck: supple Heart: regular rate and rhythm Lungs: clear to auscultation bilaterally Abdomen: soft; nondistended; mild diffuse tenderness; bowel sounds present Extremities: No deformity; pulses normal Neurologic: Awake, alert and oriented; motor function intact in all extremities and symmetric; no facial droop Skin: Warm and dry Psychiatric: Flat  affect   RESULTS  Summary of this visit's results, reviewed and interpreted by myself:   EKG Interpretation  Date/Time:    Ventricular Rate:    PR Interval:    QRS Duration:   QT Interval:    QTC Calculation:   R Axis:     Text Interpretation:         Laboratory Studies: Results for orders placed or performed during the hospital encounter of 07/21/22 (from the past 24 hour(s))  CBG monitoring, ED     Status: Abnormal   Collection Time: 07/21/22 10:09 PM  Result Value Ref Range   Glucose-Capillary 339 (H) 70 - 99 mg/dL  Urinalysis, Routine w reflex microscopic -Urine, Clean Catch  Status: Abnormal   Collection Time: 07/21/22 10:23 PM  Result Value Ref Range   Color, Urine YELLOW YELLOW   APPearance CLEAR CLEAR   Specific Gravity, Urine 1.016 1.005 - 1.030   pH 5.0 5.0 - 8.0   Glucose, UA 150 (A) NEGATIVE mg/dL   Hgb urine dipstick NEGATIVE NEGATIVE   Bilirubin Urine NEGATIVE NEGATIVE   Ketones, ur NEGATIVE NEGATIVE mg/dL   Protein, ur NEGATIVE NEGATIVE mg/dL   Nitrite NEGATIVE NEGATIVE   Leukocytes,Ua TRACE (A) NEGATIVE   RBC / HPF 0-5 0 - 5 RBC/hpf   WBC, UA 0-5 0 - 5 WBC/hpf   Bacteria, UA RARE (A) NONE SEEN   Squamous Epithelial / HPF 0-5 0 - 5 /HPF   Mucus PRESENT    Hyaline Casts, UA PRESENT   CBC with Differential     Status: None   Collection Time: 07/21/22 10:45 PM  Result Value Ref Range   WBC 8.4 4.0 - 10.5 K/uL   RBC 4.60 3.87 - 5.11 MIL/uL   Hemoglobin 12.9 12.0 - 15.0 g/dL   HCT 39.6 36.0 - 46.0 %   MCV 86.1 80.0 - 100.0 fL   MCH 28.0 26.0 - 34.0 pg   MCHC 32.6 30.0 - 36.0 g/dL   RDW 14.4 11.5 - 15.5 %   Platelets 171 150 - 400 K/uL   nRBC 0.0 0.0 - 0.2 %   Neutrophils Relative % 75 %   Neutro Abs 6.3 1.7 - 7.7 K/uL   Lymphocytes Relative 17 %   Lymphs Abs 1.5 0.7 - 4.0 K/uL   Monocytes Relative 6 %   Monocytes Absolute 0.5 0.1 - 1.0 K/uL   Eosinophils Relative 1 %   Eosinophils Absolute 0.1 0.0 - 0.5 K/uL   Basophils Relative 1 %    Basophils Absolute 0.1 0.0 - 0.1 K/uL   Immature Granulocytes 0 %   Abs Immature Granulocytes 0.02 0.00 - 0.07 K/uL  Comprehensive metabolic panel     Status: Abnormal   Collection Time: 07/21/22 10:45 PM  Result Value Ref Range   Sodium 134 (L) 135 - 145 mmol/L   Potassium 3.4 (L) 3.5 - 5.1 mmol/L   Chloride 101 98 - 111 mmol/L   CO2 23 22 - 32 mmol/L   Glucose, Bld 300 (H) 70 - 99 mg/dL   BUN 36 (H) 6 - 20 mg/dL   Creatinine, Ser 1.31 (H) 0.44 - 1.00 mg/dL   Calcium 8.4 (L) 8.9 - 10.3 mg/dL   Total Protein 7.0 6.5 - 8.1 g/dL   Albumin 4.2 3.5 - 5.0 g/dL   AST 18 15 - 41 U/L   ALT 16 0 - 44 U/L   Alkaline Phosphatase 128 (H) 38 - 126 U/L   Total Bilirubin 0.5 0.3 - 1.2 mg/dL   GFR, Estimated 49 (L) >60 mL/min   Anion gap 10 5 - 15  hCG, quantitative, pregnancy     Status: None   Collection Time: 07/21/22 10:45 PM  Result Value Ref Range   hCG, Beta Chain, Quant, S 4 <5 mIU/mL  Resp panel by RT-PCR (RSV, Flu A&B, Covid) Anterior Nasal Swab     Status: None   Collection Time: 07/21/22 10:47 PM   Specimen: Anterior Nasal Swab  Result Value Ref Range   SARS Coronavirus 2 by RT PCR NEGATIVE NEGATIVE   Influenza A by PCR NEGATIVE NEGATIVE   Influenza B by PCR NEGATIVE NEGATIVE   Resp Syncytial Virus by PCR NEGATIVE NEGATIVE  Digoxin level  Status: Abnormal   Collection Time: 07/22/22  2:50 AM  Result Value Ref Range   Digoxin Level <0.2 (L) 0.8 - 2.0 ng/mL  CBG monitoring, ED     Status: Abnormal   Collection Time: 07/22/22  4:30 AM  Result Value Ref Range   Glucose-Capillary 121 (H) 70 - 99 mg/dL   Imaging Studies: CT ABDOMEN PELVIS W CONTRAST  Result Date: 07/22/2022 CLINICAL DATA:  Abdominal pain, acute, nonlocalized. EXAM: CT ABDOMEN AND PELVIS WITH CONTRAST TECHNIQUE: Multidetector CT imaging of the abdomen and pelvis was performed using the standard protocol following bolus administration of intravenous contrast. RADIATION DOSE REDUCTION: This exam was performed  according to the departmental dose-optimization program which includes automated exposure control, adjustment of the mA and/or kV according to patient size and/or use of iterative reconstruction technique. CONTRAST:  18m OMNIPAQUE IOHEXOL 300 MG/ML  SOLN COMPARISON:  None Available. FINDINGS: Lower chest: Mild cardiomegaly with left ventricular dilation. No acute abnormality. Hepatobiliary: No focal liver abnormality is seen. No gallstones, gallbladder wall thickening, or biliary dilatation. Pancreas: Unremarkable Spleen: Unremarkable Adrenals/Urinary Tract: The adrenal glands are unremarkable. The kidneys are normal in size and position. Mild asymmetric right renal cortical scarring. Simple cortical cyst noted within the interpolar region of the right kidney for which no follow-up imaging is recommended. 15 mm indeterminate low-attenuation lesion within the interpolar region of the left kidney appears stable since prior examination but may contain enhancing internal components and is indeterminate. No hydronephrosis. No intrarenal or ureteral calculi. The bladder is unremarkable. Stomach/Bowel: Moderate colonic stool burden without evidence of obstruction. The stomach,, small bowel, and large bowel are otherwise unremarkable. No evidence of obstruction or focal inflammation. Appendix normal. No free intraperitoneal gas or fluid. Vascular/Lymphatic: Aortic atherosclerosis. No enlarged abdominal or pelvic lymph nodes. Reproductive: Uterus and bilateral adnexa are unremarkable. Other: Small fat containing bilateral inguinal hernia. Musculoskeletal: No acute bone abnormality. No lytic or blastic bone lesion. Degenerative changes are seen at the lumbosacral junction and within the hips bilaterally, asymmetrically more severe within the left hip. IMPRESSION: 1. No acute intra-abdominal pathology identified. 2. Mild cardiomegaly with left ventricular dilation. 3. Moderate colonic stool burden without evidence of  obstruction. 4. 15 mm indeterminate low-attenuation lesion within the interpolar region of the left kidney. This may contain enhancing internal components and is indeterminate. If indicated, dedicated renal mass protocol CT or MRI examination is recommended for further evaluation. 5. Aortic atherosclerosis. Aortic Atherosclerosis (ICD10-I70.0). Electronically Signed   By: AFidela SalisburyM.D.   On: 07/22/2022 03:54    ED COURSE and MDM  Nursing notes, initial and subsequent vitals signs, including pulse oximetry, reviewed and interpreted by myself.  Vitals:   07/21/22 2208 07/21/22 2212 07/22/22 0230 07/22/22 0246  BP:  (!) 71/52 (!) 107/55   Pulse:  95 91   Resp:  18 16   Temp:  99.2 F (37.3 C)  (!) 97.4 F (36.3 C)  TempSrc:  Oral  Oral  SpO2:  99% 94%   Weight: 82 kg 81.6 kg    Height: 5' 7"$  (1.702 m) 5' 7"$  (1.702 m)     Medications  acetaminophen (TYLENOL) tablet 650 mg (650 mg Oral Given 07/22/22 0257)  insulin aspart (novoLOG) injection 10 Units (10 Units Subcutaneous Given 07/22/22 0248)  ondansetron (ZOFRAN) injection 4 mg (4 mg Intravenous Given 07/22/22 0300)  fentaNYL (SUBLIMAZE) injection 100 mcg (100 mcg Intravenous Given 07/22/22 0302)  iohexol (OMNIPAQUE) 300 MG/ML solution 80 mL (80 mLs Intravenous Contrast Given 07/22/22 0326)  4:31 AM I suspect the patient's abdominal pain is due to constipation.  She was previously on chronic narcotics prescribed by her primary care physician.  She has not had a prescription filled since the end of December.  The patient may be abusing opioids obtained on the street.  She is requesting pain medication for her hip and knee but I do not believe this is in her best interest.  She should follow-up with her primary care physician.  4:32 AM Sugar is down to 121.  She was not given a fluid bolus due to her known poor ejection fraction.  She was advised that we would not be providing any narcotics for her because chronic pain needs to be managed  by her primary care physician.  She was advised of her constipation.  PROCEDURES  Procedures   ED DIAGNOSES     ICD-10-CM   1. Hyperglycemia  R73.9     2. Constipation, unspecified constipation type  K59.00          Gad Aymond, Jenny Reichmann, MD 07/22/22 [id number]

## 2022-07-29 ENCOUNTER — Encounter: Payer: Self-pay | Admitting: *Deleted

## 2022-07-29 NOTE — Congregational Nurse Program (Signed)
  Dept: [phone]   Congregational Nurse Program Note  Date of Encounter: 07/29/2022  Past Medical History: Past Medical History:  Diagnosis Date   Anxiety disorder    Asthma    Bipolar affective disorder ([place])    Depression    Diabetes mellitus    Migraine    Schizophrenia ([place])     Encounter Details:  CNP Questionnaire - 07/29/22 1134       Questionnaire   Ask client: Do you give verbal consent for me to treat you today? Yes    Student Assistance N/A    Location Patient Served  [facility]    Visit Setting with Client Organization    Patient Status Unhoused    Insurance Medicaid    Insurance/Financial Assistance Referral N/A    Medication Have Medication Insecurities    Medical Provider No    Screening Referrals Made N/A    Medical Referrals Made Non-[facility] PCP/Clinic    Medical Appointment Made Non-[facility] PCP/clinic    Recently w/o PCP, now 1st time PCP visit completed due to CNs referral or appointment made N/A    Food N/A    Transportation Need transportation assistance;Provided transportation assistance    Housing/Utilities No permanent housing    Interpersonal Safety N/A    Interventions Advocate/Support;Navigate Healthcare System    Abnormal to Normal Screening Since Last CN Visit N/A    Screenings CN Performed Blood Glucose    Sent Client to Lab for: N/A    Did client attend any of the following based off CNs referral or appointments made? N/A    ED Visit Averted N/A    Life-Saving Intervention Made N/A            Client seen at [facility] seeking help with a PCP. Per conversation, client has been to [facility] in the past. Contacted nearby [facility] on [address] in [place] and made an appt for Thursday Feb 22 at 1:45 with Dr Carlyle Lipa. Gave bus passes for transportation. Referred to [facility]. Checked vitals 116/79 pulse 92 CBG 300. Explained importance of keeping appt and having blood sugar monitored and checked by MD. Client reports she has not  taken insulin medication in months.  Carrell Palmatier W RN CN

## 2022-07-31 ENCOUNTER — Encounter: Payer: Self-pay | Admitting: *Deleted

## 2022-07-31 NOTE — Congregational Nurse Program (Signed)
  Dept: [phone]   Congregational Nurse Program Note  Date of Encounter: 07/31/2022  Past Medical History: Past Medical History:  Diagnosis Date   Anxiety disorder    Asthma    Bipolar affective disorder ([place])    Depression    Diabetes mellitus    Migraine    Schizophrenia ([place])     Encounter Details:  CNP Questionnaire - 07/31/22 1439       Questionnaire   Ask client: Do you give verbal consent for me to treat you today? Yes    Student Assistance N/A    Location Patient Served  [facility]    Visit Setting with Client Organization    Patient Status Unknown    Insurance Medicaid    Insurance/Financial Assistance Referral N/A    Medication Have Medication Insecurities    Medical Provider No    Screening Referrals Made N/A    Medical Referrals Made N/A    Medical Appointment Made N/A    Recently w/o PCP, now 1st time PCP visit completed due to CNs referral or appointment made N/A    Food N/A    Transportation Need transportation assistance;Provided transportation assistance    Interpersonal Safety N/A    Interventions Advocate/Support    Abnormal to Normal Screening Since Last CN Visit N/A    Screenings CN Performed N/A    Sent Client to Lab for: N/A    Did client attend any of the following based off CNs referral or appointments made? N/A    ED Visit Averted N/A    Life-Saving Intervention Made N/A           Client seen in [facility] lobby asking about help with transportation to doctor's appt tomorrow. She does not have a telephone. 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 Set up transportation to doctor's appt. Client has bus passes for back up. Padraic Marinos W RN CN

## 2022-08-02 ENCOUNTER — Encounter: Payer: Self-pay | Admitting: *Deleted

## 2022-08-02 NOTE — Congregational Nurse Program (Signed)
  Dept: [phone]   Congregational Nurse Program Note  Date of Encounter: 08/02/2022  Past Medical History: Past Medical History:  Diagnosis Date   Anxiety disorder    Asthma    Bipolar affective disorder ([place])    Depression    Diabetes mellitus    Migraine    Schizophrenia ([place])     Encounter Details:  CNP Questionnaire - 08/02/22 1131       Questionnaire   Ask client: Do you give verbal consent for me to treat you today? Yes    Student Assistance [facility]    Location Patient Served  [facility]    Visit Setting with Client Organization;Phone/Text/Email    Patient Status Unhoused    Insurance Medicaid    Insurance/Financial Assistance Referral N/A    Medication Have Medication Insecurities    Medical Provider Yes    Screening Referrals Made N/A    Medical Referrals Made N/A    Medical Appointment Made N/A    Recently w/o PCP, now 1st time PCP visit completed due to CNs referral or appointment made Yes    Food N/A    Transportation N/A    Housing/Utilities No permanent housing    Interpersonal Safety N/A    Interventions Advocate/Support;Navigate Healthcare System    Abnormal to Normal Screening Since Last CN Visit N/A    Screenings CN Performed N/A    Sent Client to Lab for: N/A    Did client attend any of the following based off CNs referral or appointments made? Yes;Transportation    ED Visit Averted N/A    Life-Saving Intervention Made N/A            Client seen at [facility] along with [facility]. Client reports she went to [facility] appt yesterday and had medications were called to [facility]. With clients permission called pharmacy to check on medications for pick up. They did not have any medication called in. Assisted client along with [facility] in contacting [facility]. Client had medication called into [facility] in [place]. Per call, no medication was called in for diabetes. Client reports she has a hx of being on insulin. MD is suppose to call client  back this afternoon at [facility] to discuss lab results and medication needs. [facility] is assisting client with call later in the day. Sakai Heinle W RN CN

## 2022-08-03 ENCOUNTER — Emergency Department (HOSPITAL_BASED_OUTPATIENT_CLINIC_OR_DEPARTMENT_OTHER): Payer: Medicaid Other

## 2022-08-03 ENCOUNTER — Other Ambulatory Visit: Payer: Self-pay

## 2022-08-03 ENCOUNTER — Encounter (HOSPITAL_COMMUNITY): Payer: Self-pay | Admitting: Internal Medicine

## 2022-08-03 ENCOUNTER — Inpatient Hospital Stay (HOSPITAL_BASED_OUTPATIENT_CLINIC_OR_DEPARTMENT_OTHER)
Admission: EM | Admit: 2022-08-03 | Discharge: 2022-08-06 | DRG: 291 | Disposition: A | Discharge status: Home / Self Care | Payer: Medicaid Other | Attending: Internal Medicine | Admitting: Internal Medicine

## 2022-08-03 DIAGNOSIS — J45901 Unspecified asthma with (acute) exacerbation: Secondary | ICD-10-CM | POA: Diagnosis present

## 2022-08-03 DIAGNOSIS — E1165 Type 2 diabetes mellitus with hyperglycemia: Secondary | ICD-10-CM | POA: Diagnosis present

## 2022-08-03 DIAGNOSIS — Z86718 Personal history of other venous thrombosis and embolism: Secondary | ICD-10-CM

## 2022-08-03 DIAGNOSIS — I5023 Acute on chronic systolic (congestive) heart failure: Secondary | ICD-10-CM | POA: Diagnosis present

## 2022-08-03 DIAGNOSIS — Z91148 Patient's other noncompliance with medication regimen for other reason: Secondary | ICD-10-CM

## 2022-08-03 DIAGNOSIS — I5043 Acute on chronic combined systolic (congestive) and diastolic (congestive) heart failure: Secondary | ICD-10-CM | POA: Diagnosis present

## 2022-08-03 DIAGNOSIS — I82890 Acute embolism and thrombosis of other specified veins: Secondary | ICD-10-CM | POA: Diagnosis present

## 2022-08-03 DIAGNOSIS — Z5901 Sheltered homelessness: Secondary | ICD-10-CM

## 2022-08-03 DIAGNOSIS — F319 Bipolar disorder, unspecified: Secondary | ICD-10-CM | POA: Diagnosis present

## 2022-08-03 DIAGNOSIS — E1169 Type 2 diabetes mellitus with other specified complication: Secondary | ICD-10-CM | POA: Diagnosis present

## 2022-08-03 DIAGNOSIS — Z885 Allergy status to narcotic agent status: Secondary | ICD-10-CM

## 2022-08-03 DIAGNOSIS — J449 Chronic obstructive pulmonary disease, unspecified: Secondary | ICD-10-CM | POA: Diagnosis present

## 2022-08-03 DIAGNOSIS — Z888 Allergy status to other drugs, medicaments and biological substances status: Secondary | ICD-10-CM

## 2022-08-03 DIAGNOSIS — Z79899 Other long term (current) drug therapy: Secondary | ICD-10-CM

## 2022-08-03 DIAGNOSIS — J40 Bronchitis, not specified as acute or chronic: Secondary | ICD-10-CM | POA: Insufficient documentation

## 2022-08-03 DIAGNOSIS — E119 Type 2 diabetes mellitus without complications: Secondary | ICD-10-CM

## 2022-08-03 DIAGNOSIS — Z794 Long term (current) use of insulin: Secondary | ICD-10-CM

## 2022-08-03 DIAGNOSIS — N179 Acute kidney failure, unspecified: Secondary | ICD-10-CM | POA: Diagnosis present

## 2022-08-03 DIAGNOSIS — Z833 Family history of diabetes mellitus: Secondary | ICD-10-CM

## 2022-08-03 DIAGNOSIS — J441 Chronic obstructive pulmonary disease with (acute) exacerbation: Secondary | ICD-10-CM | POA: Diagnosis present

## 2022-08-03 DIAGNOSIS — E785 Hyperlipidemia, unspecified: Secondary | ICD-10-CM | POA: Diagnosis present

## 2022-08-03 DIAGNOSIS — I5022 Chronic systolic (congestive) heart failure: Secondary | ICD-10-CM | POA: Diagnosis present

## 2022-08-03 DIAGNOSIS — N1831 Chronic kidney disease, stage 3a: Secondary | ICD-10-CM | POA: Diagnosis present

## 2022-08-03 DIAGNOSIS — Z8249 Family history of ischemic heart disease and other diseases of the circulatory system: Secondary | ICD-10-CM

## 2022-08-03 DIAGNOSIS — Z7901 Long term (current) use of anticoagulants: Secondary | ICD-10-CM

## 2022-08-03 DIAGNOSIS — Z1152 Encounter for screening for COVID-19: Secondary | ICD-10-CM

## 2022-08-03 DIAGNOSIS — F419 Anxiety disorder, unspecified: Secondary | ICD-10-CM | POA: Diagnosis present

## 2022-08-03 DIAGNOSIS — E1122 Type 2 diabetes mellitus with diabetic chronic kidney disease: Secondary | ICD-10-CM | POA: Diagnosis present

## 2022-08-03 DIAGNOSIS — E876 Hypokalemia: Secondary | ICD-10-CM

## 2022-08-03 DIAGNOSIS — I130 Hypertensive heart and chronic kidney disease with heart failure and stage 1 through stage 4 chronic kidney disease, or unspecified chronic kidney disease: Principal | ICD-10-CM | POA: Diagnosis present

## 2022-08-03 DIAGNOSIS — Z818 Family history of other mental and behavioral disorders: Secondary | ICD-10-CM

## 2022-08-03 DIAGNOSIS — I509 Heart failure, unspecified: Secondary | ICD-10-CM | POA: Insufficient documentation

## 2022-08-03 DIAGNOSIS — I428 Other cardiomyopathies: Secondary | ICD-10-CM | POA: Diagnosis present

## 2022-08-03 LAB — CBC WITH DIFFERENTIAL/PLATELET
Abs Immature Granulocytes: 0.02 10*3/uL (ref 0.00–0.07)
Basophils Absolute: 0 10*3/uL (ref 0.0–0.1)
Basophils Relative: 1 %
Eosinophils Absolute: 0.1 10*3/uL (ref 0.0–0.5)
Eosinophils Relative: 2 %
HCT: 36.1 % (ref 36.0–46.0)
Hemoglobin: 11.9 g/dL — ABNORMAL LOW (ref 12.0–15.0)
Immature Granulocytes: 0 %
Lymphocytes Relative: 21 %
Lymphs Abs: 1.2 10*3/uL (ref 0.7–4.0)
MCH: 28.5 pg (ref 26.0–34.0)
MCHC: 33 g/dL (ref 30.0–36.0)
MCV: 86.6 fL (ref 80.0–100.0)
Monocytes Absolute: 0.5 10*3/uL (ref 0.1–1.0)
Monocytes Relative: 9 %
Neutro Abs: 3.7 10*3/uL (ref 1.7–7.7)
Neutrophils Relative %: 67 %
Platelets: 176 10*3/uL (ref 150–400)
RBC: 4.17 MIL/uL (ref 3.87–5.11)
RDW: 14.8 % (ref 11.5–15.5)
WBC: 5.5 10*3/uL (ref 4.0–10.5)
nRBC: 0 % (ref 0.0–0.2)

## 2022-08-03 LAB — CBG MONITORING, ED: Glucose-Capillary: 258 mg/dL — ABNORMAL HIGH (ref 70–99)

## 2022-08-03 LAB — COMPREHENSIVE METABOLIC PANEL
ALT: 17 U/L (ref 0–44)
AST: 12 U/L — ABNORMAL LOW (ref 15–41)
Albumin: 4.2 g/dL (ref 3.5–5.0)
Alkaline Phosphatase: 108 U/L (ref 38–126)
Anion gap: 12 (ref 5–15)
BUN: 20 mg/dL (ref 6–20)
CO2: 29 mmol/L (ref 22–32)
Calcium: 9.4 mg/dL (ref 8.9–10.3)
Chloride: 99 mmol/L (ref 98–111)
Creatinine, Ser: 1.27 mg/dL — ABNORMAL HIGH (ref 0.44–1.00)
GFR, Estimated: 51 mL/min — ABNORMAL LOW (ref >60)
Glucose, Bld: 316 mg/dL — ABNORMAL HIGH (ref 70–99)
Potassium: 2.8 mmol/L — ABNORMAL LOW (ref 3.5–5.1)
Sodium: 140 mmol/L (ref 135–145)
Total Bilirubin: 0.5 mg/dL (ref 0.3–1.2)
Total Protein: 6.8 g/dL (ref 6.5–8.1)

## 2022-08-03 LAB — BRAIN NATRIURETIC PEPTIDE: B Natriuretic Peptide: 1100.2 pg/mL — ABNORMAL HIGH (ref 0.0–100.0)

## 2022-08-03 LAB — RESP PANEL BY RT-PCR (RSV, FLU A&B, COVID)  RVPGX2
Influenza A by PCR: NEGATIVE
Influenza B by PCR: NEGATIVE
Resp Syncytial Virus by PCR: NEGATIVE
SARS Coronavirus 2 by RT PCR: NEGATIVE

## 2022-08-03 LAB — GLUCOSE, CAPILLARY
Glucose-Capillary: 302 mg/dL — ABNORMAL HIGH (ref 70–99)
Glucose-Capillary: 363 mg/dL — ABNORMAL HIGH (ref 70–99)

## 2022-08-03 MED ORDER — SODIUM CHLORIDE 0.9% FLUSH
3.0000 mL | Freq: Two times a day (BID) | INTRAVENOUS | Status: DC
  Administered 2022-08-03 – 2022-08-06 (×5): 3 mL via INTRAVENOUS

## 2022-08-03 MED ORDER — GUAIFENESIN ER 600 MG PO TB12
1200.0000 mg | ORAL_TABLET | Freq: Two times a day (BID) | ORAL | Status: DC
  Administered 2022-08-03 – 2022-08-06 (×6): 1200 mg via ORAL
  Filled 2022-08-03 (×6): qty 2

## 2022-08-03 MED ORDER — ONDANSETRON HCL 4 MG/2ML IJ SOLN
4.0000 mg | Freq: Four times a day (QID) | INTRAMUSCULAR | Status: DC | PRN
  Administered 2022-08-03 – 2022-08-06 (×7): 4 mg via INTRAVENOUS
  Filled 2022-08-03 (×7): qty 2

## 2022-08-03 MED ORDER — ATORVASTATIN CALCIUM 10 MG PO TABS
20.0000 mg | ORAL_TABLET | Freq: Every day | ORAL | Status: DC
  Administered 2022-08-03 – 2022-08-06 (×4): 20 mg via ORAL
  Filled 2022-08-03 (×5): qty 2

## 2022-08-03 MED ORDER — DIGOXIN 125 MCG PO TABS
0.1250 mg | ORAL_TABLET | Freq: Every day | ORAL | Status: DC
  Administered 2022-08-03 – 2022-08-06 (×4): 0.125 mg via ORAL
  Filled 2022-08-03 (×4): qty 1

## 2022-08-03 MED ORDER — POTASSIUM CHLORIDE CRYS ER 20 MEQ PO TBCR
40.0000 meq | EXTENDED_RELEASE_TABLET | Freq: Once | ORAL | Status: AC
  Administered 2022-08-03: 40 meq via ORAL
  Filled 2022-08-03: qty 2

## 2022-08-03 MED ORDER — APIXABAN 5 MG PO TABS: 5.0000 mg | ORAL_TABLET | Freq: Two times a day (BID) | ORAL | Status: DC

## 2022-08-03 MED ORDER — SODIUM CHLORIDE 0.9% FLUSH: 3.0000 mL | INTRAVENOUS | Status: DC | PRN

## 2022-08-03 MED ORDER — INSULIN GLARGINE-YFGN 100 UNIT/ML ~~LOC~~ SOLN
20.0000 [IU] | Freq: Every day | SUBCUTANEOUS | Status: DC
  Administered 2022-08-03 – 2022-08-06 (×4): 20 [IU] via SUBCUTANEOUS
  Filled 2022-08-03 (×4): qty 0.2

## 2022-08-03 MED ORDER — TRAZODONE HCL 100 MG PO TABS
100.0000 mg | ORAL_TABLET | Freq: Every day | ORAL | Status: DC
  Administered 2022-08-03 – 2022-08-04 (×2): 100 mg via ORAL
  Filled 2022-08-03 (×2): qty 1

## 2022-08-03 MED ORDER — ENOXAPARIN SODIUM 40 MG/0.4ML IJ SOSY
40.0000 mg | PREFILLED_SYRINGE | INTRAMUSCULAR | Status: DC
  Administered 2022-08-03 – 2022-08-05 (×3): 40 mg via SUBCUTANEOUS
  Filled 2022-08-03 (×3): qty 0.4

## 2022-08-03 MED ORDER — FUROSEMIDE 10 MG/ML IJ SOLN
40.0000 mg | Freq: Two times a day (BID) | INTRAMUSCULAR | Status: DC
  Administered 2022-08-03 – 2022-08-04 (×2): 40 mg via INTRAVENOUS
  Filled 2022-08-03 (×2): qty 4

## 2022-08-03 MED ORDER — POTASSIUM CHLORIDE CRYS ER 10 MEQ PO TBCR
40.0000 meq | EXTENDED_RELEASE_TABLET | Freq: Once | ORAL | Status: AC
  Administered 2022-08-03: 40 meq via ORAL
  Filled 2022-08-03: qty 4

## 2022-08-03 MED ORDER — OXYCODONE HCL 5 MG PO TABS
10.0000 mg | ORAL_TABLET | ORAL | Status: DC | PRN
  Administered 2022-08-03 – 2022-08-06 (×14): 10 mg via ORAL
  Filled 2022-08-03 (×14): qty 2

## 2022-08-03 MED ORDER — LEVOFLOXACIN 750 MG PO TABS
750.0000 mg | ORAL_TABLET | Freq: Every day | ORAL | Status: DC
  Administered 2022-08-03 – 2022-08-04 (×2): 750 mg via ORAL
  Filled 2022-08-03 (×2): qty 1

## 2022-08-03 MED ORDER — POTASSIUM CHLORIDE CRYS ER 10 MEQ PO TBCR
20.0000 meq | EXTENDED_RELEASE_TABLET | Freq: Every day | ORAL | Status: DC
  Administered 2022-08-04 – 2022-08-05 (×2): 20 meq via ORAL
  Filled 2022-08-03 (×2): qty 2

## 2022-08-03 MED ORDER — INSULIN ASPART 100 UNIT/ML IJ SOLN
0.0000 [IU] | Freq: Three times a day (TID) | INTRAMUSCULAR | Status: DC
  Administered 2022-08-04 (×2): 7 [IU] via SUBCUTANEOUS
  Administered 2022-08-04: 20 [IU] via SUBCUTANEOUS
  Administered 2022-08-05 (×2): 3 [IU] via SUBCUTANEOUS
  Administered 2022-08-05 – 2022-08-06 (×2): 4 [IU] via SUBCUTANEOUS
  Administered 2022-08-06: 3 [IU] via SUBCUTANEOUS

## 2022-08-03 MED ORDER — FUROSEMIDE 10 MG/ML IJ SOLN
40.0000 mg | Freq: Once | INTRAMUSCULAR | Status: AC
  Administered 2022-08-03: 40 mg via INTRAVENOUS
  Filled 2022-08-03: qty 4

## 2022-08-03 MED ORDER — GUAIFENESIN 100 MG/5ML PO LIQD: 5.0000 mL | ORAL | Status: DC | PRN

## 2022-08-03 MED ORDER — POTASSIUM CHLORIDE CRYS ER 20 MEQ PO TBCR
40.0000 meq | EXTENDED_RELEASE_TABLET | ORAL | Status: DC
  Administered 2022-08-03: 40 meq via ORAL
  Filled 2022-08-03: qty 2

## 2022-08-03 MED ORDER — ISOSORBIDE MONONITRATE ER 60 MG PO TB24
60.0000 mg | ORAL_TABLET | Freq: Every day | ORAL | Status: DC
  Administered 2022-08-03 – 2022-08-06 (×4): 60 mg via ORAL
  Filled 2022-08-03 (×4): qty 1

## 2022-08-03 MED ORDER — PREDNISONE 20 MG PO TABS
40.0000 mg | ORAL_TABLET | Freq: Every day | ORAL | Status: DC
  Administered 2022-08-04: 40 mg via ORAL
  Filled 2022-08-03: qty 2

## 2022-08-03 MED ORDER — MOMETASONE FURO-FORMOTEROL FUM 200-5 MCG/ACT IN AERO
2.0000 | INHALATION_SPRAY | Freq: Two times a day (BID) | RESPIRATORY_TRACT | Status: DC
  Administered 2022-08-03 – 2022-08-06 (×4): 2 via RESPIRATORY_TRACT
  Filled 2022-08-03: qty 8.8

## 2022-08-03 MED ORDER — BACITRACIN-POLYMYXIN B 500-10000 UNIT/GM OP OINT
TOPICAL_OINTMENT | Freq: Two times a day (BID) | OPHTHALMIC | Status: DC
  Administered 2022-08-05: 1 via OPHTHALMIC
  Filled 2022-08-03: qty 3.5

## 2022-08-03 MED ORDER — ACETAMINOPHEN 325 MG PO TABS
325.0000 mg | ORAL_TABLET | ORAL | Status: DC | PRN
  Administered 2022-08-03 – 2022-08-04 (×4): 325 mg via ORAL
  Filled 2022-08-03 (×5): qty 1

## 2022-08-03 MED ORDER — BUDESONIDE 0.5 MG/2ML IN SUSP
2.0000 mg | Freq: Two times a day (BID) | RESPIRATORY_TRACT | Status: DC
  Filled 2022-08-03 (×2): qty 8

## 2022-08-03 MED ORDER — OXYCODONE-ACETAMINOPHEN 10-325 MG PO TABS: 1.0000 | ORAL_TABLET | ORAL | Status: DC | PRN

## 2022-08-03 MED ORDER — IPRATROPIUM-ALBUTEROL 0.5-2.5 (3) MG/3ML IN SOLN
3.0000 mL | Freq: Four times a day (QID) | RESPIRATORY_TRACT | Status: DC
  Filled 2022-08-03: qty 3

## 2022-08-03 MED ORDER — FLUTICASONE PROPIONATE 50 MCG/ACT NA SUSP
1.0000 | Freq: Every day | NASAL | Status: DC
  Administered 2022-08-03 – 2022-08-06 (×4): 1 via NASAL
  Filled 2022-08-03: qty 16

## 2022-08-03 MED ORDER — METOPROLOL SUCCINATE ER 25 MG PO TB24
25.0000 mg | ORAL_TABLET | Freq: Every day | ORAL | Status: DC
  Administered 2022-08-04 – 2022-08-06 (×3): 25 mg via ORAL
  Filled 2022-08-03 (×3): qty 1

## 2022-08-03 MED ORDER — SERTRALINE HCL 100 MG PO TABS
100.0000 mg | ORAL_TABLET | Freq: Every day | ORAL | Status: DC
  Administered 2022-08-03 – 2022-08-06 (×4): 100 mg via ORAL
  Filled 2022-08-03 (×4): qty 1

## 2022-08-03 MED ORDER — MIRTAZAPINE 30 MG PO TABS
30.0000 mg | ORAL_TABLET | Freq: Every day | ORAL | Status: DC
  Administered 2022-08-03 – 2022-08-05 (×3): 30 mg via ORAL
  Filled 2022-08-03 (×4): qty 1

## 2022-08-03 MED ORDER — SODIUM CHLORIDE 0.9 % IV SOLN: 250.0000 mL | INTRAVENOUS | Status: DC | PRN

## 2022-08-03 MED ORDER — INSULIN ASPART 100 UNIT/ML IJ SOLN
0.0000 [IU] | Freq: Every day | INTRAMUSCULAR | Status: DC
  Administered 2022-08-03: 3 [IU] via SUBCUTANEOUS
  Administered 2022-08-04: 2 [IU] via SUBCUTANEOUS

## 2022-08-03 MED ORDER — FUROSEMIDE 10 MG/ML IJ SOLN: 80.0000 mg | Freq: Once | INTRAMUSCULAR | Status: DC

## 2022-08-03 MED ORDER — POTASSIUM CHLORIDE CRYS ER 20 MEQ PO TBCR: 20.0000 meq | EXTENDED_RELEASE_TABLET | Freq: Every day | ORAL | Status: DC

## 2022-08-03 MED ORDER — ACETAMINOPHEN 325 MG PO TABS
650.0000 mg | ORAL_TABLET | ORAL | Status: DC | PRN
  Administered 2022-08-03 – 2022-08-05 (×4): 650 mg via ORAL
  Filled 2022-08-03 (×2): qty 2

## 2022-08-03 MED ORDER — RISPERIDONE 1 MG PO TABS
1.0000 mg | ORAL_TABLET | Freq: Every day | ORAL | Status: DC
  Administered 2022-08-03 – 2022-08-05 (×3): 1 mg via ORAL
  Filled 2022-08-03 (×4): qty 1

## 2022-08-03 MED ORDER — ALBUTEROL SULFATE (2.5 MG/3ML) 0.083% IN NEBU: 2.5000 mg | INHALATION_SOLUTION | RESPIRATORY_TRACT | Status: DC | PRN

## 2022-08-03 NOTE — ED Notes (Signed)
[name] [name] with cl called for transport

## 2022-08-03 NOTE — ED Notes (Signed)
Pt states she is still unable to provide urine sample at this time.

## 2022-08-03 NOTE — H&P (Addendum)
History and Physical    [name] [name] [id number] DOB: 12-22-1970 DOA: 08/03/2022  PCP: Patient, No Pcp Per (Confirm with patient/family/NH records and if not entered, this has to be entered at [facility] point of entry) Patient coming from: Home  I have personally briefly reviewed patient's old medical records in [place]  Chief Complaint: SOB, leg swelling  HPI: [name] [name] is a 52 y.o. female with medical history significant of chronic combined HFrEF with LVEF 20-5%, and HFpEF, secondary to nonischemic cardiomyopathy, moderate MR, COPD/asthma, cigarette smoker, IIDM, noncompliant with medications, homeless, presented with worsening shortness of breath and leg swelling.  Symptoms started 1 week ago patient started to develop productive cough with yellow/greenish sputum and increasing exertional dyspnea with bilateral leg swelling.  For the last 2 days just increased her torsemide from 40 mg twice daily to 60 mg twice daily with no significant improvement of her breathing symptoms or leg swelling.  Denies any chest pain, no fever or chills. ED Course: Afebrile, none tachycardia nonhypotensive O2 saturation 94% on room air.  Chest x-ray showed cardiomegaly.  The patient was given IV Lasix 40 mg in the ED.  Blood work showed K2.8, glucose 300.  Review of Systems: As per HPI otherwise 14 point review of systems negative.   Past Medical History:  Diagnosis Date   Anxiety disorder    Asthma    Bipolar affective disorder ([place])    Depression    Diabetes mellitus    Migraine    Schizophrenia ([place])     Past Surgical History:  Procedure Laterality Date   KNEE ARTHROSCOPY     x 2   KNEE SURGERY Left    Incision made and knee cleaned out   TUBAL LIGATION       reports that she has been smoking cigarettes. She has a 29.00 pack-year smoking history. She has never used smokeless tobacco. She reports that she does not currently use alcohol. She reports that she does not use  drugs.  Allergies  Allergen Reactions   Tramadol Nausea Only    "feeling like I'm on fire"   Wellbutrin [Bupropion] Other (See Comments)    rage   Nicotine Rash    Patient states she is allergic to the Nicotine patch and they cause her to break out in a rash.  She states she can smoke cigarettes.    Family History  Problem Relation Age of Onset   Bipolar disorder Mother    Hypertension Father    Diabetes Father      Prior to Admission medications   Medication Sig Start Date End Date Taking? Authorizing Provider  apixaban (ELIQUIS) 5 MG TABS tablet Take 5 mg by mouth 2 (two) times daily.    [provider]  atorvastatin (LIPITOR) 20 MG tablet Take 20 mg by mouth daily. 03/15/22 06/13/22  [provider]  atorvastatin (LIPITOR) 20 MG tablet Take 20 mg by mouth daily.    [provider]  blood glucose meter kit and supplies Dispense based on patient and insurance preference. Use up to four times daily as directed. (FOR ICD-10 E10.9, E11.9). 01/06/18   Eugenie Filler, MD  bumetanide (BUMEX) 2 MG tablet Take 2 mg by mouth 2 (two) times daily.    [provider]  digoxin (LANOXIN) 0.125 MG tablet Take 1 tablet (0.125 mg total) by mouth daily. 05/18/22 06/17/22  Darliss Cheney, MD  doxycycline (VIBRAMYCIN) 100 MG capsule Take 1 capsule (100 mg total) by mouth  2 (two) times daily. 07/07/22   Jeanell Sparrow, DO  furosemide (LASIX) 40 MG tablet Take 80 mg by mouth daily.    [provider]  guaiFENesin (ROBITUSSIN) 100 MG/5ML liquid Take 5 mLs by mouth every 4 (four) hours as needed for cough or to loosen phlegm. 07/07/22   Jeanell Sparrow, DO  insulin glargine (LANTUS SOLOSTAR) 100 UNIT/ML Solostar Pen Inject 20 Units into the skin in the morning. Do not take if blood sugar is <100. 07/07/22   Wynona Dove A, DO  isosorbide mononitrate (IMDUR) 60 MG 24 hr tablet Take 60 mg by mouth daily.    [provider]  magnesium oxide (MAG-OX) 400 (241.3 Mg)  MG tablet Take 1 tablet (400 mg total) by mouth 2 (two) times daily. Patient taking differently: Take 400 mg by mouth daily. 07/20/20   Pokhrel, Corrie Mckusick, MD  metoprolol succinate (TOPROL-XL) 25 MG 24 hr tablet Take 25 mg by mouth daily.    [provider]  mirtazapine (REMERON) 30 MG tablet Take 30 mg by mouth at bedtime.    [provider]  oxyCODONE-acetaminophen (PERCOCET) 10-325 MG tablet Take 1 tablet by mouth every 4 (four) hours as needed for pain.    [provider]  potassium chloride SA (KLOR-CON M) 20 MEQ tablet Take 20 mEq by mouth daily. 01/16/22   [provider]  risperiDONE (RISPERDAL) 1 MG tablet Take 1 mg by mouth at bedtime.    [provider]  sertraline (ZOLOFT) 100 MG tablet Take 1 tablet (100 mg total) by mouth daily for 14 days. 07/07/22 07/21/22  Jeanell Sparrow, DO  sertraline (ZOLOFT) 100 MG tablet Take 100 mg by mouth daily.    [provider]  traZODone (DESYREL) 50 MG tablet Take 100 mg by mouth at bedtime.    [provider]  albuterol (PROVENTIL HFA;VENTOLIN HFA) 108 (90 Base) MCG/ACT inhaler Inhale 2 puffs into the lungs every 6 (six) hours as needed for wheezing or shortness of breath. Patient not taking: Reported on 12/13/2018 01/06/18 06/04/20  Eugenie Filler, MD  benztropine (COGENTIN) 1 MG tablet Take 1 tablet (1 mg total) by mouth 2 (two) times daily. 12/23/18 06/04/20  Johnn Hai, MD  insulin aspart (NOVOLOG) 100 UNIT/ML injection Inject 0-9 Units into the skin 3 (three) times daily with meals. Patient not taking: Reported on 06/03/2020 12/16/18 06/04/20  Antonieta Pert, MD    Physical Exam: Vitals:   08/03/22 1400 08/03/22 1414 08/03/22 1415 08/03/22 1515  BP: 112/62  104/67 106/68  Pulse: 74 76 73 70  Resp: (!) 26 (!) 22 (!) 22 (!) 22  Temp:  97.6 F (36.4 C)  98.4 F (36.9 C)  TempSrc:  Oral  Oral  SpO2:  94% 94% 94%  Weight:      Height:        Constitutional: NAD, calm,  comfortable Vitals:   08/03/22 1400 08/03/22 1414 08/03/22 1415 08/03/22 1515  BP: 112/62  104/67 106/68  Pulse: 74 76 73 70  Resp: (!) 26 (!) 22 (!) 22 (!) 22  Temp:  97.6 F (36.4 C)  98.4 F (36.9 C)  TempSrc:  Oral  Oral  SpO2:  94% 94% 94%  Weight:      Height:       Eyes: PERRL, lids and conjunctivae normal ENMT: Mucous membranes are moist. Posterior pharynx clear of any exudate or lesions.Normal dentition.  Neck: normal, supple, no masses, no thyromegaly Respiratory: clear to auscultation bilaterally, diffused  wheezing bilaterally, fine crackles on bilateral lower fields, increasing breathing effort.  No accessory muscle use.  Cardiovascular: Regular rate and rhythm, no murmurs / rubs / gallops. 1+ extremity edema. 2+ pedal pulses. No carotid bruits.  Abdomen: no tenderness, no masses palpated. No hepatosplenomegaly. Bowel sounds positive.  Musculoskeletal: no clubbing / cyanosis. No joint deformity upper and lower extremities. Good ROM, no contractures. Normal muscle tone.  Skin: no rashes, lesions, ulcers. No induration Neurologic: CN 2-12 grossly intact. Sensation intact, DTR normal. Strength 5/5 in all 4.  Psychiatric: Normal judgment and insight. Alert and oriented x 3. Normal mood.     Labs on Admission: I have personally reviewed following labs and imaging studies  CBC: Recent Labs  Lab 08/03/22 1152  WBC 5.5  NEUTROABS 3.7  HGB 11.9*  HCT 36.1  MCV 86.6  PLT 0000000   Basic Metabolic Panel: Recent Labs  Lab 08/03/22 1152  NA 140  K 2.8*  CL 99  CO2 29  GLUCOSE 316*  BUN 20  CREATININE 1.27*  CALCIUM 9.4   GFR: Estimated Creatinine Clearance: 53.4 mL/min (A) (by C-G formula based on SCr of 1.27 mg/dL (H)). Liver Function Tests: Recent Labs  Lab 08/03/22 1152  AST 12*  ALT 17  ALKPHOS 108  BILITOT 0.5  PROT 6.8  ALBUMIN 4.2   No results for input(s): "LIPASE", "AMYLASE" in the last 168 hours. No results for input(s): "AMMONIA" in the last  168 hours. Coagulation Profile: No results for input(s): "INR", "PROTIME" in the last 168 hours. Cardiac Enzymes: No results for input(s): "CKTOTAL", "CKMB", "CKMBINDEX", "TROPONINI" in the last 168 hours. BNP (last 3 results) No results for input(s): "PROBNP" in the last 8760 hours. HbA1C: No results for input(s): "HGBA1C" in the last 72 hours. CBG: Recent Labs  Lab 08/03/22 1420 08/03/22 1624  GLUCAP 258* 302*   Lipid Profile: No results for input(s): "CHOL", "HDL", "LDLCALC", "TRIG", "CHOLHDL", "LDLDIRECT" in the last 72 hours. Thyroid Function Tests: No results for input(s): "TSH", "T4TOTAL", "FREET4", "T3FREE", "THYROIDAB" in the last 72 hours. Anemia Panel: No results for input(s): "VITAMINB12", "FOLATE", "FERRITIN", "TIBC", "IRON", "RETICCTPCT" in the last 72 hours. Urine analysis:    Component Value Date/Time   COLORURINE YELLOW 07/21/2022 2223   APPEARANCEUR CLEAR 07/21/2022 2223   LABSPEC 1.016 07/21/2022 2223   PHURINE 5.0 07/21/2022 2223   GLUCOSEU 150 (A) 07/21/2022 2223   HGBUR NEGATIVE 07/21/2022 2223   HGBUR trace-intact 11/25/2008 1530   BILIRUBINUR NEGATIVE 07/21/2022 2223   KETONESUR NEGATIVE 07/21/2022 2223   PROTEINUR NEGATIVE 07/21/2022 2223   UROBILINOGEN 0.2 05/12/2014 2315   NITRITE NEGATIVE 07/21/2022 2223   LEUKOCYTESUR TRACE (A) 07/21/2022 2223    Radiological Exams on Admission: DG Chest Port 1 View  Result Date: 08/03/2022 CLINICAL DATA:  Cough and shortness of breath EXAM: PORTABLE CHEST 1 VIEW COMPARISON:  07/07/2022 and prior studies FINDINGS: This is a low volume study.  Telemetry leads overlie the chest. Cardiomegaly again noted. There is no evidence of focal airspace disease, pulmonary edema, suspicious pulmonary nodule/mass, pleural effusion, or pneumothorax. No acute bony abnormalities are identified. IMPRESSION: Cardiomegaly without evidence of acute cardiopulmonary disease. Electronically Signed   By: Margarette Canada M.D.   On: 08/03/2022  13:29    EKG: Independently reviewed.  Sinus rhythm, nonspecific ST changes on multiple leads  Assessment/Plan Principal Problem:   Acute on chronic systolic heart failure (HCC) Active Problems:   Diabetes (HCC)   Bronchitis   CHF (congestive heart failure) ([place])  (  please populate well all problems here in Problem List. (For example, if patient is on BP meds at home and you resume or decide to hold them, it is a problem that needs to be her. Same for CAD, COPD, HLD and so on)  Acute on chronic combined HFrEF and HFpEF decompensation -Question of whether patient has been compliant with her new diuresis regimen.  According to care everywhere patient was hospitalized 12/6-12/8 at [facility] for CHF decompensation and then patient was hospitalized again at [facility] from 12 18-12 28 for similar CHF decompensation and on discharge her diuresis regimen was changed from torsemide to Bumex.  However patient told me that she is still taking torsemide 40 mg twice daily at home and for the last 2 days she has increased her torsemide from 40 mg twice daily to 60 mg twice daily with no significant improvement -For now we will increase her diuresis to IV Lasix 40 mg twice daily -She had multiple varicose in the last 2 months at home and at [facility] both showed depressed LVEF ~20%, will not repeat at this point.   -Check digoxin level  Acute COPD exacerbation -She has had new productive cough with yellow-greenish sputum -And she has diffused wheezing on physical exam, as well as noncompliant with her inhalers makes COPD exacerbation likely than cardiac asthma -Resume LABA, ICS -DuoNeb every 6 hours plus as needed Atrovent -Short course p.o. steroid -Given there is a change of sputum color, highly suspect new bronchitis, with baseline history of diabetes, will cover her with Levaquin for 5 days.  Hypokalemia -IV and p.o. replacement, recheck level tomorrow  IIDM with hyperglycemia -Most recent A1c 7.4.   Suspect noncompliance -Start sliding scale for now  Question of Eliquis use? -Patient not aware she is on Eliquis. Her pharmacy is closed for the day. But Eliquis is no longer discharge medication in December at home or later in December at [place] error? I also went through her Echo at [facility] and [place], neither showed any LV thrombosis.  Will hold off Eliquis for now.  Homeless -Has been living in a shelter. -Case manager  DVT prophylaxis: Lovenox Code Status: Full code Family Communication: None at bedside Disposition Plan: Expect less than 2 midnight hospital stay Consults called: None Admission status: Tele obs   Lequita Halt MD [facility] Pager [phone]  08/03/2022, 7:09 PM

## 2022-08-03 NOTE — Plan of Care (Signed)
Patient is a 52 year old woman with a PMH of nonischemic cardiomyopathy with LVEF less than 20%, homeless, tobacco abuse, diabetes, bipolar disorder who presented with progressively worsening of cough, shortness of breath and wheezing.  Associated with PND and orthopnea.  She was instructed to increase her home Lasix to 40 mg 3 times daily by cardiologist recently, which did not work for her.  Admit to observation cardiac telemetry bed for CHF exacerbation.  Charlann Lange, MD 1:36 PM

## 2022-08-03 NOTE — ED Provider Notes (Signed)
[facility] Provider Note   CSN: [id number] Arrival date & time: 08/03/22  1026     History  Chief Complaint  Patient presents with   Cough    [name] [name] is a 52 y.o. female.  HPI 52 year old woman with type 2 diabetes, benign essential hypertension, schizo affect disorder, tobacco abuse, bipolar disorder with moderate depression, nonischemic cardiomyopathy and mitral regurgitation with a EF less than 20% comes in with chief complaint of cough, congestion and shortness of breath.  Patient [facility].  She is currently homeless.  She states that she has been feeling congested and having cough for the last week.  Cough is mostly nonproductive.  No fevers, chills.  She also has not been taking her medication.  She had gone to [facility] yesterday, she thinks her medications are screwed up.  She denies any orthopnea, PND.  She does admit to increased leg swelling.  Patient has received care at [facility], [facility], [facility] and [facility] over the last 6 months.  Home Medications Prior to Admission medications   Medication Sig Start Date End Date Taking? Authorizing Provider  apixaban (ELIQUIS) 5 MG TABS tablet Take 5 mg by mouth 2 (two) times daily.    [provider]  atorvastatin (LIPITOR) 20 MG tablet Take 20 mg by mouth daily. 03/15/22 06/13/22  [provider]  atorvastatin (LIPITOR) 20 MG tablet Take 20 mg by mouth daily.    [provider]  blood glucose meter kit and supplies Dispense based on patient and insurance preference. Use up to four times daily as directed. (FOR ICD-10 E10.9, E11.9). 01/06/18   Eugenie Filler, MD  bumetanide (BUMEX) 2 MG tablet Take 2 mg by mouth 2 (two) times daily.    [provider]  digoxin (LANOXIN) 0.125 MG tablet Take 1 tablet (0.125 mg total) by mouth daily. 05/18/22 06/17/22  Darliss Cheney, MD  doxycycline (VIBRAMYCIN) 100 MG capsule Take 1 capsule (100 mg total)  by mouth 2 (two) times daily. 07/07/22   Jeanell Sparrow, DO  furosemide (LASIX) 40 MG tablet Take 80 mg by mouth daily.    [provider]  guaiFENesin (ROBITUSSIN) 100 MG/5ML liquid Take 5 mLs by mouth every 4 (four) hours as needed for cough or to loosen phlegm. 07/07/22   Jeanell Sparrow, DO  insulin glargine (LANTUS SOLOSTAR) 100 UNIT/ML Solostar Pen Inject 20 Units into the skin in the morning. Do not take if blood sugar is <100. 07/07/22   Wynona Dove A, DO  isosorbide mononitrate (IMDUR) 60 MG 24 hr tablet Take 60 mg by mouth daily.    [provider]  magnesium oxide (MAG-OX) 400 (241.3 Mg) MG tablet Take 1 tablet (400 mg total) by mouth 2 (two) times daily. Patient taking differently: Take 400 mg by mouth daily. 07/20/20   Pokhrel, Corrie Mckusick, MD  metoprolol succinate (TOPROL-XL) 25 MG 24 hr tablet Take 25 mg by mouth daily.    [provider]  mirtazapine (REMERON) 30 MG tablet Take 30 mg by mouth at bedtime.    [provider]  oxyCODONE-acetaminophen (PERCOCET) 10-325 MG tablet Take 1 tablet by mouth every 4 (four) hours as needed for pain.    [provider]  potassium chloride SA (KLOR-CON M) 20 MEQ tablet Take 20 mEq by mouth daily. 01/16/22   [provider]  risperiDONE (RISPERDAL) 1 MG tablet Take 1 mg by mouth at bedtime.    [provider]  sertraline (ZOLOFT) 100 MG tablet Take 1 tablet (100 mg total) by mouth daily for 14 days. 07/07/22 07/21/22  Jeanell Sparrow, DO  sertraline (ZOLOFT) 100 MG tablet Take 100 mg by mouth daily.    [provider]  traZODone (DESYREL) 50 MG tablet Take 100 mg by mouth at bedtime.    [provider]  albuterol (PROVENTIL HFA;VENTOLIN HFA) 108 (90 Base) MCG/ACT inhaler Inhale 2 puffs into the lungs every 6 (six) hours as needed for wheezing or shortness of breath. Patient not taking: Reported on 12/13/2018 01/06/18 06/04/20  Eugenie Filler, MD  benztropine (COGENTIN) 1 MG tablet  Take 1 tablet (1 mg total) by mouth 2 (two) times daily. 12/23/18 06/04/20  Johnn Hai, MD  insulin aspart (NOVOLOG) 100 UNIT/ML injection Inject 0-9 Units into the skin 3 (three) times daily with meals. Patient not taking: Reported on 06/03/2020 12/16/18 06/04/20  Antonieta Pert, MD      Allergies    Tramadol, Wellbutrin [bupropion], and Nicotine    Review of Systems   Review of Systems  All other systems reviewed and are negative.   Physical Exam Updated Vital Signs BP 106/68 (BP Location: Right Arm)   Pulse 70   Temp 98.4 F (36.9 C) (Oral)   Resp (!) 22   Ht '5\' 5"'$  (1.651 m)   Wt 79.4 kg   SpO2 94%   BMI 29.12 kg/m  Physical Exam Vitals and nursing note reviewed.  Constitutional:      Appearance: She is well-developed.  HENT:     Head: Atraumatic.  Cardiovascular:     Rate and Rhythm: Normal rate.  Pulmonary:     Effort: Pulmonary effort is normal.     Breath sounds: Wheezing present. No rales.  Musculoskeletal:     Cervical back: Normal range of motion and neck supple.  Skin:    General: Skin is warm and dry.  Neurological:     Mental Status: She is alert and oriented to person, place, and time.     ED Results / Procedures / Treatments   Labs (all labs ordered are listed, but only abnormal results are displayed) Labs Reviewed  COMPREHENSIVE METABOLIC PANEL - Abnormal; Notable for the following components:      Result Value   Potassium 2.8 (*)    Glucose, Bld 316 (*)    Creatinine, Ser 1.27 (*)    AST 12 (*)    GFR, Estimated 51 (*)    All other components within normal limits  CBC WITH DIFFERENTIAL/PLATELET - Abnormal; Notable for the following components:   Hemoglobin 11.9 (*)    All other components within normal limits  BRAIN NATRIURETIC PEPTIDE - Abnormal; Notable for the following components:   B Natriuretic Peptide 1,100.2 (*)    All other components within normal limits  CBG MONITORING, ED - Abnormal; Notable for the following components:    Glucose-Capillary 258 (*)    All other components within normal limits  RESP PANEL BY RT-PCR (RSV, FLU A&B, COVID)  [id number]  URINALYSIS, ROUTINE W REFLEX MICROSCOPIC    EKG EKG Interpretation  Date/Time:  Saturday August 03 2022 11:41:00 EST Ventricular Rate:  71 PR Interval:  177 QRS Duration: 111 QT Interval:  477 QTC Calculation: 519 R Axis:   104 Text Interpretation: Sinus rhythm Paired ventricular premature complexes Biatrial enlargement Right axis deviation Consider left ventricular hypertrophy Anterior Q waves, possibly due to LVH Prolonged QT interval - unchanged Confirmed by Kathrynn Humble, Kenly Henckel [phone])  on 08/03/2022 11:42:52 AM  Radiology DG Chest Port 1 View  Result Date: 08/03/2022 CLINICAL DATA:  Cough and shortness of breath EXAM: PORTABLE CHEST 1 VIEW COMPARISON:  07/07/2022 and prior studies FINDINGS: This is a low volume study.  Telemetry leads overlie the chest. Cardiomegaly again noted. There is no evidence of focal airspace disease, pulmonary edema, suspicious pulmonary nodule/mass, pleural effusion, or pneumothorax. No acute bony abnormalities are identified. IMPRESSION: Cardiomegaly without evidence of acute cardiopulmonary disease. Electronically Signed   By: Margarette Canada M.D.   On: 08/03/2022 13:29    Procedures Procedures    Medications Ordered in ED Medications  potassium chloride SA (KLOR-CON M) CR tablet 40 mEq (40 mEq Oral Given 08/03/22 1250)  furosemide (LASIX) injection 40 mg (40 mg Intravenous Given 08/03/22 1415)    ED Course/ Medical Decision Making/ A&P                             Medical Decision Making Amount and/or Complexity of Data Reviewed Labs: ordered. Radiology: ordered.  Risk Prescription drug management. Decision regarding hospitalization.   52 year old female with multiple medical comorbidities including asthma, tobacco use disorder, CHF, homelessness, diabetes comes in with chief complaint of cough, congestion.  She also has some  subjective shortness of breath that is exertional and worsening leg swelling.  She also wants her blood sugar to be checked and ensure that she is on the right medications.  On exam patient has generalized wheezing.  No evidence of respiratory distress, tachypnea and patient does not have rales.  Differential diagnosis includes URI, asthma exacerbation, bronchitis, pulmonary edema/CHF.  Plan is to get basic labs.  We will get x-ray of the chest as well.   Reassessment: X-ray of the chest independently interpreted.  There is no clear evidence of significant volume overload.  Previous x-ray did have slightly more pulmonary edema.  Her BNP is over thousand.  Patient on reassessment continues to state that she feels volume up, she is having orthopnea, PND like symptoms.  She has social determinants of health present, homelessness -which further makes it difficult to discharge her.  Finally patient has been noted to have O2 sats in the 80s and she has already increased her Lasix to 40 mg 3 times daily without improvement, therefore it would be best to admit her.  Final Clinical Impression(s) / ED Diagnoses Final diagnoses:  Acute congestive heart failure, unspecified heart failure type ([place])  Hypokalemia    Rx / DC Orders ED Discharge Orders     None         Varney Biles, MD 08/03/22 [phone]

## 2022-08-03 NOTE — ED Triage Notes (Signed)
Pt arrived via [facility] from [facility] shelter. Pt c/o productive cough and congestion x1 week. Hyperglycemia with EMS, hx diabetes   EMS VS BP 128/80 HR 92 RR 16  SpO2% 99 CBG 348

## 2022-08-04 DIAGNOSIS — E1169 Type 2 diabetes mellitus with other specified complication: Secondary | ICD-10-CM

## 2022-08-04 DIAGNOSIS — Z79899 Other long term (current) drug therapy: Secondary | ICD-10-CM | POA: Diagnosis not present

## 2022-08-04 DIAGNOSIS — Z794 Long term (current) use of insulin: Secondary | ICD-10-CM | POA: Diagnosis not present

## 2022-08-04 DIAGNOSIS — N179 Acute kidney failure, unspecified: Secondary | ICD-10-CM | POA: Diagnosis present

## 2022-08-04 DIAGNOSIS — J45901 Unspecified asthma with (acute) exacerbation: Secondary | ICD-10-CM | POA: Diagnosis present

## 2022-08-04 DIAGNOSIS — Z833 Family history of diabetes mellitus: Secondary | ICD-10-CM | POA: Diagnosis not present

## 2022-08-04 DIAGNOSIS — F319 Bipolar disorder, unspecified: Secondary | ICD-10-CM | POA: Diagnosis present

## 2022-08-04 DIAGNOSIS — N1831 Chronic kidney disease, stage 3a: Secondary | ICD-10-CM | POA: Diagnosis present

## 2022-08-04 DIAGNOSIS — N189 Chronic kidney disease, unspecified: Secondary | ICD-10-CM

## 2022-08-04 DIAGNOSIS — J449 Chronic obstructive pulmonary disease, unspecified: Secondary | ICD-10-CM | POA: Diagnosis present

## 2022-08-04 DIAGNOSIS — E785 Hyperlipidemia, unspecified: Secondary | ICD-10-CM

## 2022-08-04 DIAGNOSIS — I5043 Acute on chronic combined systolic (congestive) and diastolic (congestive) heart failure: Secondary | ICD-10-CM | POA: Diagnosis present

## 2022-08-04 DIAGNOSIS — Z86718 Personal history of other venous thrombosis and embolism: Secondary | ICD-10-CM | POA: Diagnosis not present

## 2022-08-04 DIAGNOSIS — I130 Hypertensive heart and chronic kidney disease with heart failure and stage 1 through stage 4 chronic kidney disease, or unspecified chronic kidney disease: Secondary | ICD-10-CM | POA: Diagnosis present

## 2022-08-04 DIAGNOSIS — Z5901 Sheltered homelessness: Secondary | ICD-10-CM | POA: Diagnosis not present

## 2022-08-04 DIAGNOSIS — E876 Hypokalemia: Secondary | ICD-10-CM | POA: Diagnosis present

## 2022-08-04 DIAGNOSIS — Z888 Allergy status to other drugs, medicaments and biological substances status: Secondary | ICD-10-CM | POA: Diagnosis not present

## 2022-08-04 DIAGNOSIS — Z885 Allergy status to narcotic agent status: Secondary | ICD-10-CM | POA: Diagnosis not present

## 2022-08-04 DIAGNOSIS — Z818 Family history of other mental and behavioral disorders: Secondary | ICD-10-CM | POA: Diagnosis not present

## 2022-08-04 DIAGNOSIS — Z8249 Family history of ischemic heart disease and other diseases of the circulatory system: Secondary | ICD-10-CM | POA: Diagnosis not present

## 2022-08-04 DIAGNOSIS — F419 Anxiety disorder, unspecified: Secondary | ICD-10-CM | POA: Diagnosis present

## 2022-08-04 DIAGNOSIS — I509 Heart failure, unspecified: Secondary | ICD-10-CM | POA: Insufficient documentation

## 2022-08-04 DIAGNOSIS — E1122 Type 2 diabetes mellitus with diabetic chronic kidney disease: Secondary | ICD-10-CM | POA: Diagnosis present

## 2022-08-04 DIAGNOSIS — I428 Other cardiomyopathies: Secondary | ICD-10-CM | POA: Diagnosis present

## 2022-08-04 DIAGNOSIS — E1165 Type 2 diabetes mellitus with hyperglycemia: Secondary | ICD-10-CM | POA: Diagnosis present

## 2022-08-04 DIAGNOSIS — Z1152 Encounter for screening for COVID-19: Secondary | ICD-10-CM | POA: Diagnosis not present

## 2022-08-04 DIAGNOSIS — J441 Chronic obstructive pulmonary disease with (acute) exacerbation: Secondary | ICD-10-CM | POA: Diagnosis present

## 2022-08-04 DIAGNOSIS — I5023 Acute on chronic systolic (congestive) heart failure: Secondary | ICD-10-CM | POA: Diagnosis present

## 2022-08-04 LAB — BASIC METABOLIC PANEL
Anion gap: 8 (ref 5–15)
BUN: 24 mg/dL — ABNORMAL HIGH (ref 6–20)
CO2: 32 mmol/L (ref 22–32)
Calcium: 8.6 mg/dL — ABNORMAL LOW (ref 8.9–10.3)
Chloride: 98 mmol/L (ref 98–111)
Creatinine, Ser: 1.63 mg/dL — ABNORMAL HIGH (ref 0.44–1.00)
GFR, Estimated: 37 mL/min — ABNORMAL LOW (ref >60)
Glucose, Bld: 272 mg/dL — ABNORMAL HIGH (ref 70–99)
Potassium: 4 mmol/L (ref 3.5–5.1)
Sodium: 138 mmol/L (ref 135–145)

## 2022-08-04 LAB — GLUCOSE, CAPILLARY
Glucose-Capillary: 271 mg/dL — ABNORMAL HIGH (ref 70–99)
Glucose-Capillary: 230 mg/dL — ABNORMAL HIGH (ref 70–99)
Glucose-Capillary: 218 mg/dL — ABNORMAL HIGH (ref 70–99)
Glucose-Capillary: 390 mg/dL — ABNORMAL HIGH (ref 70–99)
Glucose-Capillary: 225 mg/dL — ABNORMAL HIGH (ref 70–99)

## 2022-08-04 LAB — HIV ANTIBODY (ROUTINE TESTING W REFLEX): HIV Screen 4th Generation wRfx: NONREACTIVE

## 2022-08-04 MED ORDER — POLYETHYLENE GLYCOL 3350 17 G PO PACK
17.0000 g | PACK | Freq: Every day | ORAL | Status: DC
  Administered 2022-08-04 – 2022-08-06 (×3): 17 g via ORAL
  Filled 2022-08-04 (×3): qty 1

## 2022-08-04 MED ORDER — ADULT MULTIVITAMIN W/MINERALS CH
1.0000 | ORAL_TABLET | Freq: Every day | ORAL | Status: DC
  Administered 2022-08-04 – 2022-08-06 (×3): 1 via ORAL
  Filled 2022-08-04 (×3): qty 1

## 2022-08-04 NOTE — Discharge Instructions (Signed)

## 2022-08-04 NOTE — Assessment & Plan Note (Addendum)
Uncontrolled T2DM with hyperglycemia.   Patient received insulin during his hospitalization, basal and sliding scale.  Plan to continue close follow up as outpatient.    Continue with statin therapy.

## 2022-08-04 NOTE — Progress Notes (Signed)
Dr. Cathlean Sauer was made aware that  the pt had had 15 beats of Vtach about 0630. Pt denied CP, SOB, and palpitation. She was sitting in bed drinking coffee and having breakfast. BP=102/65. HR=88. No new orders given.

## 2022-08-04 NOTE — Evaluation (Signed)
Occupational Therapy Evaluation Patient Details Name: [name] [name] MRN: [id number] DOB: September 27, 1970 Today's Date: 08/04/2022   History of Present Illness [name] [name] is a 52 y.o. female who presented with worsening shortness of breath and leg swelling. PMH includes: nonischemic cardiomyopathy, severe MR, schizoaffective disorder, COPD/asthma, cigarette smoker, IIDM, noncompliant with medications   Clinical Impression   [name] was evaluated s/p the above admission list. She reports she is indep at baseline and currently lives at a homeless shelter. Upon evaluation she was limited by impaired cognition, weakness, decreased activity tolerance, unsteady gait, and poor vision (due to holding eyes closed - pt reporting pink eye). Overall she required min G for bed mobility, and up to min A for transfers and short mobility. Pt noted to reach for external support and was quite unsteady.  Pt will benefit from continued acute OT services. Recommend d/c to SNF pending progression acutely.       Recommendations for follow up therapy are one component of a multi-disciplinary discharge planning process, led by the attending physician.  Recommendations may be updated based on patient status, additional functional criteria and insurance authorization.   Follow Up Recommendations  Skilled nursing-short term rehab (<3 hours/day) (Pending acute progression)     Assistance Recommended at Discharge Intermittent Supervision/Assistance  Patient can return home with the following A little help with walking and/or transfers;A little help with bathing/dressing/bathroom;Assistance with cooking/housework;Assist for transportation;Help with stairs or ramp for entrance    Functional Status Assessment  Patient has had a recent decline in their functional status and demonstrates the ability to make significant improvements in function in a reasonable and predictable amount of time.  Equipment Recommendations  Other  (comment) (pending progress)    Recommendations for Other Services       Precautions / Restrictions Precautions Precautions: Fall Restrictions Weight Bearing Restrictions: No      Mobility Bed Mobility Overal bed mobility: Needs Assistance Bed Mobility: Supine to Sit     Supine to sit: Min guard     General bed mobility comments: increased time, HOB eleveated    Transfers Overall transfer level: Needs assistance Equipment used: Rolling walker (2 wheels) Transfers: Sit to/from Stand Sit to Stand: Min assist                  Balance Overall balance assessment: Needs assistance, History of Falls Sitting-balance support: No upper extremity supported Sitting balance-Leahy Scale: Fair     Standing balance support: Single extremity supported, During functional activity Standing balance-Leahy Scale: Poor                             ADL either performed or assessed with clinical judgement   ADL Overall ADL's : Needs assistance/impaired Eating/Feeding: Independent;Sitting   Grooming: Set up;Sitting   Upper Body Bathing: Set up;Sitting   Lower Body Bathing: Minimal assistance;Sit to/from stand   Upper Body Dressing : Set up;Sitting   Lower Body Dressing: Minimal assistance;Sit to/from stand   Toilet Transfer: Minimal assistance;Ambulation Toilet Transfer Details (indicate cue type and reason): reaching for external support, potentially due to holding eyes closed Toileting- Clothing Manipulation and Hygiene: Min guard;Sitting/lateral lean       Functional mobility during ADLs: Minimal assistance General ADL Comments: eyes closed, unsteady, pain, self limiting     Vision Baseline Vision/History: 0 No visual deficits Patient Visual Report: Other (comment) (pink eye?) Vision Assessment?: Vision impaired- to be further tested in functional context Additional  Comments: holding eyes closed, self reports pink eye     Perception  Perception Perception Tested?: No   Praxis Praxis Praxis tested?: Not tested    Pertinent Vitals/Pain Pain Assessment Pain Assessment: Faces Faces Pain Scale: Hurts little more Pain Location: head, back, L hip and knee Pain Descriptors / Indicators: Headache, Sore Pain Intervention(s): Limited activity within patient's tolerance, Monitored during session     Hand Dominance Right   Extremity/Trunk Assessment Upper Extremity Assessment Upper Extremity Assessment: Generalized weakness   Lower Extremity Assessment Lower Extremity Assessment: Generalized weakness   Cervical / Trunk Assessment Cervical / Trunk Assessment: Kyphotic   Communication Communication Communication: No difficulties   Cognition Arousal/Alertness: Lethargic Behavior During Therapy: Flat affect Overall Cognitive Status: No family/caregiver present to determine baseline cognitive functioning                                 General Comments: lethargic, holding eyes closed the majority of the session. Not offering a lot of information for PLOF and discharge planning     General Comments  VSS onRA    Exercises     Shoulder Instructions      Home Living Family/patient expects to be discharged to:: Shelter/Homeless                                 Additional Comments: per tp report, she has been at the shelter for 2 weeks. Prior to that she was staying at her son's apartment      Prior Functioning/Environment Prior Level of Function : Independent/Modified Independent             Mobility Comments: independent ADLs Comments: reports indep        OT Problem List: Decreased strength;Decreased range of motion;Decreased activity tolerance;Impaired balance (sitting and/or standing);Decreased safety awareness;Decreased knowledge of use of DME or AE;Decreased knowledge of precautions;Pain      OT Treatment/Interventions: Self-care/ADL training;Therapeutic exercise;DME  and/or AE instruction;Therapeutic activities;Patient/family education;Balance training    OT Goals(Current goals can be found in the care plan section) Acute Rehab OT Goals Patient Stated Goal: to feel better OT Goal Formulation: With patient Time For Goal Achievement: 08/18/22 Potential to Achieve Goals: Good ADL Goals Pt Will Perform Grooming: with supervision;standing Pt Will Perform Upper Body Dressing: Independently Pt Will Perform Lower Body Dressing: with supervision;sit to/from stand Pt Will Transfer to Toilet: with supervision;ambulating  OT Frequency: Min 2X/week    Co-evaluation              AM-PAC OT "6 Clicks" Daily Activity     Outcome Measure Help from another person eating meals?: None Help from another person taking care of personal grooming?: A Little Help from another person toileting, which includes using toliet, bedpan, or urinal?: A Little Help from another person bathing (including washing, rinsing, drying)?: A Little Help from another person to put on and taking off regular upper body clothing?: None Help from another person to put on and taking off regular lower body clothing?: A Little 6 Click Score: 20   End of Session Nurse Communication: Mobility status  Activity Tolerance: Patient tolerated treatment well Patient left: in chair;with call bell/phone within reach  OT Visit Diagnosis: Unsteadiness on feet (R26.81);Other abnormalities of gait and mobility (R26.89);Muscle weakness (generalized) (M62.81);Pain                Time:  [id number] OT Time Calculation (min): 15 min Charges:  OT General Charges $OT Visit: 1 Visit OT Evaluation $OT Eval Moderate Complexity: 1 Mod  Shade Flood, OTR/L Acute Rehabilitation Services Office [phone] Secure Chat Communication Preferred   Elliot Cousin 08/04/2022, 9:59 AM

## 2022-08-04 NOTE — Evaluation (Signed)
Physical Therapy Evaluation Patient Details Name: [name] [name] MRN: [id number] DOB: 1970/09/11 Today's Date: 08/04/2022  History of Present Illness  [name] [name] is a 52 y.o. female who presented with worsening shortness of breath and leg swelling. PMH includes: nonischemic cardiomyopathy, severe MR, schizoaffective disorder, COPD/asthma, cigarette smoker, IIDM, noncompliant with medications  Clinical Impression   Pt presents with generalized weakness, impaired balance with history of falls, impaired activity tolerance. Pt to benefit from acute PT to address deficits. Pt overall requiring light physical assist for transfer-level mobility, pt declines further mobility stating she is hurting and is dizzy (BP stable). Pt arriving from homeless shelter, states her plan is to go back to homeless shelter at d/c but PT recommending ST-SNF given deficits. PT to progress mobility as tolerated, and will continue to follow acutely.         Recommendations for follow up therapy are one component of a multi-disciplinary discharge planning process, led by the attending physician.  Recommendations may be updated based on patient status, additional functional criteria and insurance authorization.  Follow Up Recommendations Skilled nursing-short term rehab (<3 hours/day) Can patient physically be transported by private vehicle: Yes    Assistance Recommended at Discharge Intermittent Supervision/Assistance  Patient can return home with the following  A little help with walking and/or transfers;A little help with bathing/dressing/bathroom    Equipment Recommendations Other (comment) (pt declines equipment, states she is going to the homeless shelter and doesn't want anyone to steal it)  Recommendations for Other Services       Functional Status Assessment Patient has had a recent decline in their functional status and demonstrates the ability to make significant improvements in function in a reasonable  and predictable amount of time.     Precautions / Restrictions Precautions Precautions: Fall Restrictions Weight Bearing Restrictions: No      Mobility  Bed Mobility Overal bed mobility: Needs Assistance Bed Mobility: Supine to Sit, Sit to Supine     Supine to sit: Min assist Sit to supine: Supervision   General bed mobility comments: assist for trunk elevation via HHA    Transfers Overall transfer level: Needs assistance   Transfers: Sit to/from Stand Sit to Stand: Min assist           General transfer comment: assist for rise and steady via HHA, x1 lateral step towards HOB only given pt fatigue and reported pain and nausea    Ambulation/Gait               General Gait Details: nt - pt declines  Science writer    Modified Rankin (Stroke Patients Only)       Balance Overall balance assessment: Needs assistance, History of Falls (fall around christmas due to using granddaughter's hoverboard) Sitting-balance support: No upper extremity supported Sitting balance-Leahy Scale: Fair     Standing balance support: No upper extremity supported, During functional activity Standing balance-Leahy Scale: Fair                               Pertinent Vitals/Pain Pain Assessment Pain Assessment: Faces Faces Pain Scale: Hurts little more Pain Location: head, back, L hip and knee Pain Descriptors / Indicators: Headache, Sore Pain Intervention(s): Limited activity within patient's tolerance, Monitored during session, Repositioned    Home Living Family/patient expects to be discharged to:: Shelter/Homeless  Additional Comments: per tp report, she has been at the shelter for 2 weeks. Prior to that she was staying at her son's apartment    Prior Function Prior Level of Function : Independent/Modified Independent             Mobility Comments: independent ADLs Comments: reports indep      Hand Dominance   Dominant Hand: Right    Extremity/Trunk Assessment   Upper Extremity Assessment Upper Extremity Assessment: Generalized weakness    Lower Extremity Assessment Lower Extremity Assessment: Generalized weakness    Cervical / Trunk Assessment Cervical / Trunk Assessment: Kyphotic  Communication   Communication: No difficulties  Cognition Arousal/Alertness: Awake/alert (eye closing throughout session but answering all questions) Behavior During Therapy: WFL for tasks assessed/performed, Flat affect Overall Cognitive Status: No family/caregiver present to determine baseline cognitive functioning                                 General Comments: pt referring to the homeless shelter as the animal shelter multiple times, pt self-limiting on PT eval        General Comments General comments (skin integrity, edema, etc.): VSS onRA    Exercises     Assessment/Plan    PT Assessment Patient needs continued PT services  PT Problem List Decreased strength;Decreased mobility;Decreased activity tolerance;Decreased balance;Decreased knowledge of use of DME;Pain;Decreased safety awareness;Decreased knowledge of precautions       PT Treatment Interventions DME instruction;Therapeutic activities;Therapeutic exercise;Patient/family education;Gait training;Stair training;Balance training;Functional mobility training;Neuromuscular re-education    PT Goals (Current goals can be found in the Care Plan section)  Acute Rehab PT Goals PT Goal Formulation: With patient Time For Goal Achievement: 08/18/22 Potential to Achieve Goals: Good    Frequency Min 3X/week     Co-evaluation               AM-PAC PT "6 Clicks" Mobility  Outcome Measure Help needed turning from your back to your side while in a flat bed without using bedrails?: A Little Help needed moving from lying on your back to sitting on the side of a flat bed without using bedrails?: A  Little Help needed moving to and from a bed to a chair (including a wheelchair)?: A Little Help needed standing up from a chair using your arms (e.g., wheelchair or bedside chair)?: A Little Help needed to walk in hospital room?: A Lot Help needed climbing 3-5 steps with a railing? : Total 6 Click Score: 15    End of Session   Activity Tolerance: Patient limited by fatigue;Patient limited by pain Patient left: in bed;with call bell/phone within reach;with bed alarm set;with nursing/sitter in room Nurse Communication: Mobility status PT Visit Diagnosis: Other abnormalities of gait and mobility (R26.89);Muscle weakness (generalized) (M62.81)    Time: [id number] PT Time Calculation (min) (ACUTE ONLY): 17 min   Charges:   PT Evaluation $PT Eval Low Complexity: 1 Low          Khalea Ventura S, PT DPT Acute Rehabilitation Services Pager [phone]  Office [phone]   Roxine Caddy E Ruffin Pyo 08/04/2022, 9:58 AM

## 2022-08-04 NOTE — Progress Notes (Signed)
Initial Nutrition Assessment  DOCUMENTATION CODES:   Not applicable  INTERVENTION:   -Double protein portions with meals -MVI with minerals daily -Provided "Heart Healthy, Consistent Carbohydrate Nutrition Therapy" handout from [facility] Nutrition Care Manual; attached to AVS/ Discharge summary -Paged DM coordinator for further recommendations; pt with uncontrolled DM and currently on steroids  NUTRITION DIAGNOSIS:   Increased nutrient needs related to chronic illness (CHF) as evidenced by estimated needs.  GOAL:   Patient will meet greater than or equal to 90% of their needs  MONITOR:   PO intake, Supplement acceptance  REASON FOR ASSESSMENT:   Consult Assessment of nutrition requirement/status  ASSESSMENT:   Pt with medical history significant of chronic combined HFrEF with LVEF 20-5%, and HFpEF, secondary to nonischemic cardiomyopathy, moderate MR, COPD/asthma, cigarette smoker, IIDM, noncompliant with medications, homeless, presented with worsening shortness of breath and leg swelling.  Pt admitted with CHF exacerbation.  Reviewed I/O's: +3 ml since admission  Pt unavailable at time of visit. Attempted to speak with pt via call to hospital room phone, however, unable to reach. RD unable to obtain further nutrition-related history or complete nutrition-focused physical exam at this time.    Pt currently on a heart healthy, carb modified diet with 2 L fluid restrictions.   Pt with multiple barriers to receiving optimal medical treatment. Pt is currently homeless, residing in the [facility] shelter. Per H&P, unsure if pt is taking medications as prescribed. Per chart review, pt has had a long standing history of difficulty obtaining medications and securing transportation to appointments. TOC consult pending.  Reviewed wt hx; pt has experienced a 12.5% wt loss over the past year. While this is not significant for time frame, is likely related to fluid changes.   Medications  reviewed and include lasix, remeron, and prednisone.  Lab Results  Component Value Date   HGBA1C 8.1 (H) 05/16/2022   PTA DM medications are 0-9 units insulin aspart TID with meals and 20 units insulin glargine daily.   Labs reviewed: CBGS: 271-230 (inpatient orders for glycemic control are 0-20 units insulin aspart TID with meals, 0-5 units insulin aspart daily at bedtime, and 20 units insulin glargine-yfgn daily). Suspect steroids contributing to hyperglycemia. Messaged DM coordinator for further insulin recommendations.  Diet Order:   Diet Order             Diet heart healthy/carb modified Room service appropriate? Yes; Fluid consistency: Thin; Fluid restriction: 2000 mL Fluid  Diet effective now                   EDUCATION NEEDS:   No education needs have been identified at this time  Skin:  Skin Assessment: Reviewed RN Assessment  Last BM:  Unknown  Height:   Ht Readings from Last 1 Encounters:  08/03/22 '5\' 5"'$  (1.651 m)    Weight:   Wt Readings from Last 1 Encounters:  08/03/22 79.4 kg    Ideal Body Weight:  56.8 kg  BMI:  Body mass index is 29.12 kg/m.  Estimated Nutritional Needs:   Kcal:  1800-2000  Protein:  100-115 grams  Fluid:  2 L    Loistine Chance, RD, LDN, [place] Registered Dietitian II Certified Diabetes Care and Education Specialist Please refer to [facility] for RD and/or RD on-call/weekend/after hours pager

## 2022-08-04 NOTE — Progress Notes (Signed)
Progress Note   Patient: [name] [name] [id number] DOB: February 25, 1971 DOA: 08/03/2022     0 DOS: the patient was seen and examined on 08/04/2022   Brief hospital course: [name] [name] was admitted to the hospital with the working diagnosis of heart failure decompensation.   52 yo female with the past medical history of heart failure, COPD/ asthma, T2DM, who presented with dyspnea and edema. Reported one week of cough, dyspnea on exertion and lower extremity edema. Over the last 2 days she increased the dose of furosemide from 40 mg bid to 60 mg bid with no significant improvement of her symptoms. On her initial physical examination her blood pressure was 112/62, HR 75, RR 26 and 02 saturation 94%, lungs with diffuse bilateral wheezing and rales, with increased work of breathing, heart with S1 and S2 present and rhythmic, with no gallops, abdomen with no distention and positive lower extremity edema.   Na 140, K 2,8 CL 99, bicarbonate 29 glucose 316, bun 20 cr 1,27  BNP 1,100  Wbc 5,5 hgb 11,9 plt 176  Sars covid 19 negative   Chest radiograph with cardiomegaly and bilateral hilar vascular congestion with no effusions.  EKG 71 bpm, right axis deviation, qtc 519, sinus rhythm with left atrial enlargement, poor R R wave progression, with no significant ST segment or T wave changes.   Patient was placed on furosemide for diuresis.     Assessment and Plan: * Acute on chronic systolic heart failure (HCC) Echocardiogram with reduced LV systolic function 123456, global hypokinesis, RV systolic function with mild reduction, RVSP 30,7, AL with moderate dilatation, moderate to severe mitral regurgitation.  Not documented urine output.  Clinically volume status has improved.   Plan to resume oral furosemide in am 80 mg daily. Continue digoxin and metoprolol. Continue with isosorbide.    COPD with acute exacerbation (HCC) Positive wheezing on examination.  02 saturation 94% on room air.  Plan  to continue bronchodilator therapy and inhaled corticosteroids. Hold on antibiotic therapy and systemic corticosteroids.   Type 2 diabetes mellitus with hyperlipidemia (HCC) Uncontrolled T2DM with hyperglycemia.   Continue glucose cover and monitoring with insulin sliding scale.  Continue basal insulin. Patient is tolerating po well.    Acute kidney injury superimposed on chronic kidney disease (HCC) CKD stage 3a.   Renal function with serum cr at 1,63 with K at 4,0 and serum bicarbonate at 32. Plan to hold on diuretic therapy for now and follow up renal function in am.  Avoid hypotension and nephrotoxic medications.         Subjective: Patient is feeling better, orthopnea and edema have improved, no dyspnea or chest pain   Physical Exam: Vitals:   08/04/22 0808 08/04/22 0847 08/04/22 1023 08/04/22 1133  BP: 102/61 112/73 105/79 (!) 120/99  Pulse: 80  86 93  Resp: 16   18  Temp: 98.1 F (36.7 C)   98 F (36.7 C)  TempSrc: Oral   Oral  SpO2:      Weight:  82.6 kg    Height:       Neurology awake and alert ENT with mild pallor Cardiovascular with S1 and S2 present and rhythmic with systolic murmur at the apex, with no gallops, or rubs Respiratory with no rales or wheezing Abdomen with no distention  Data Reviewed:    Family Communication: no family at the bedside   Disposition: Status is: Inpatient Remains inpatient appropriate because: heart failure, possible home tomorrow   Planned Discharge Destination:  Home      Author: Tawni Millers, MD 08/04/2022 3:31 PM  For on call review [email or web address].

## 2022-08-04 NOTE — Assessment & Plan Note (Signed)
CKD stage 3a. Hypokalemia.   Patient with clinically improved in volume status, renal function at the time of her discharge has a serum cr of 1,54 with K at 4,4 and serum bicarbonate at 27. Na 134   Patient will resume bumetanide and K supplementation.  Follow up renal function and electrolytes in 7 days as outpatient.

## 2022-08-04 NOTE — Assessment & Plan Note (Signed)
Positive wheezing on examination.  02 saturation 94% on room air.  Plan to continue bronchodilator therapy and inhaled corticosteroids. Hold on antibiotic therapy and systemic corticosteroids.

## 2022-08-04 NOTE — Hospital Course (Signed)
[name] [name] was admitted to the hospital with the working diagnosis of heart failure decompensation.   52 yo female with the past medical history of heart failure, COPD/ asthma, T2DM, who presented with dyspnea and edema. Reported one week of cough, dyspnea on exertion and lower extremity edema. Over the last 2 days she increased the dose of furosemide from 40 mg bid to 60 mg bid with no significant improvement of her symptoms. On her initial physical examination her blood pressure was 112/62, HR 75, RR 26 and 02 saturation 94%, lungs with diffuse bilateral wheezing and rales, with increased work of breathing, heart with S1 and S2 present and rhythmic, with no gallops, abdomen with no distention and positive lower extremity edema.   Na 140, K 2,8 CL 99, bicarbonate 29 glucose 316, bun 20 cr 1,27  BNP 1,100  Wbc 5,5 hgb 11,9 plt 176  Sars covid 19 negative   Chest radiograph with cardiomegaly and bilateral hilar vascular congestion with no effusions.  EKG 71 bpm, right axis deviation, qtc 519, sinus rhythm with left atrial enlargement, poor R R wave progression, with no significant ST segment or T wave changes.   Patient was placed on furosemide for diuresis.   02/26 volume status improving, pending stabilization of renal function.  02/27 renal function has improved, patient will continue oral diuretic therapy at home and will need close follow up as outpatient.

## 2022-08-04 NOTE — Assessment & Plan Note (Signed)
Echocardiogram with reduced LV systolic function 123456, global hypokinesis, RV systolic function with mild reduction, RVSP 30,7, AL with moderate dilatation, moderate to severe mitral regurgitation.  Patient was placed on IV furosemide with improvement of her symptoms.   Continue metoprolol and isosorbide for heart failure management.  Resume bumetanide. Follow up with Cardiology as outpatient for mitral regurgitation.

## 2022-08-05 DIAGNOSIS — I5023 Acute on chronic systolic (congestive) heart failure: Secondary | ICD-10-CM | POA: Diagnosis not present

## 2022-08-05 DIAGNOSIS — E1169 Type 2 diabetes mellitus with other specified complication: Secondary | ICD-10-CM | POA: Diagnosis not present

## 2022-08-05 DIAGNOSIS — F419 Anxiety disorder, unspecified: Secondary | ICD-10-CM | POA: Diagnosis present

## 2022-08-05 DIAGNOSIS — J441 Chronic obstructive pulmonary disease with (acute) exacerbation: Secondary | ICD-10-CM | POA: Diagnosis not present

## 2022-08-05 DIAGNOSIS — N179 Acute kidney failure, unspecified: Secondary | ICD-10-CM | POA: Diagnosis not present

## 2022-08-05 LAB — BASIC METABOLIC PANEL
Anion gap: 8 (ref 5–15)
BUN: 36 mg/dL — ABNORMAL HIGH (ref 6–20)
CO2: 34 mmol/L — ABNORMAL HIGH (ref 22–32)
Calcium: 8.8 mg/dL — ABNORMAL LOW (ref 8.9–10.3)
Chloride: 95 mmol/L — ABNORMAL LOW (ref 98–111)
Creatinine, Ser: 2.12 mg/dL — ABNORMAL HIGH (ref 0.44–1.00)
GFR, Estimated: 27 mL/min — ABNORMAL LOW (ref >60)
Glucose, Bld: 249 mg/dL — ABNORMAL HIGH (ref 70–99)
Potassium: 4.1 mmol/L (ref 3.5–5.1)
Sodium: 137 mmol/L (ref 135–145)

## 2022-08-05 LAB — GLUCOSE, CAPILLARY
Glucose-Capillary: 147 mg/dL — ABNORMAL HIGH (ref 70–99)
Glucose-Capillary: 146 mg/dL — ABNORMAL HIGH (ref 70–99)
Glucose-Capillary: 194 mg/dL — ABNORMAL HIGH (ref 70–99)
Glucose-Capillary: 117 mg/dL — ABNORMAL HIGH (ref 70–99)

## 2022-08-05 MED ORDER — ZOLPIDEM TARTRATE 5 MG PO TABS
5.0000 mg | ORAL_TABLET | Freq: Every day | ORAL | Status: DC
  Administered 2022-08-05: 5 mg via ORAL
  Filled 2022-08-05: qty 1

## 2022-08-05 MED ORDER — BUDESONIDE 0.5 MG/2ML IN SUSP
0.5000 mg | Freq: Two times a day (BID) | RESPIRATORY_TRACT | Status: DC
  Administered 2022-08-05 – 2022-08-06 (×3): 0.5 mg via RESPIRATORY_TRACT
  Filled 2022-08-05 (×3): qty 2

## 2022-08-05 NOTE — Progress Notes (Signed)
Mobility Specialist Progress Note   08/05/22 1025  Mobility  Activity Ambulated with assistance in hallway  Level of Assistance Standby assist, set-up cues, supervision of patient - no hands on  Assistive Device None (Occasional touching of wall rails)  Distance Ambulated (ft) 480 ft  Range of Motion/Exercises Active;All extremities  Activity Response Tolerated well   Pre Ambulation:  HR 90 +/-, SpO2 98% RA During Ambulation: HR 118 +/-, SpO2 >90% RA Post Ambulation: HR 90 +/-, SpO2 93% RA  Patient received in recliner and agreeable to participate. Prior to and during ambulation patient complained of lightheadedness, R-hip/knee pain, and back pain. All VSS. Ambulated supervision level with slow antalgic gait. No overt LOB noted during ambulation. Returned to room without incident despite complaints of lightheadedness, stated she has vertigo but with no visible S/sx. Was left in dangling EOB with all needs met, call bell in reach.   [name] [name], BS EXP Mobility Specialist Please contact via SecureChat or Rehab office at [phone]

## 2022-08-05 NOTE — Progress Notes (Signed)
Pt ambulated on a hallway independently on RA without any difficulties.  Pt has been satting 100% on RA. Pt also took a shower this afternoon.  Idolina Primer, RN

## 2022-08-05 NOTE — TOC Initial Note (Addendum)
Transition of Care [facility]) - Initial/Assessment Note    Patient Details  Name: [name] [name] MRN: [id number] Date of Birth: 20-Feb-1971  [facility]) CM/SW Contact:    Milas Gain, [place] Phone Number: 08/05/2022, 11:46 AM  Clinical Narrative:                  CSW received consult for possible SNF placement at time of discharge. CSW spoke with patient regarding PT recommendation of SNF placement at time of discharge. Patient reports PTA she came from [facility]. Patient expressed understanding of PT recommendation and is agreeable to SNF placement at time of discharge. Patient gave CSW permisson to fax out initial referral near [place] and [place] for possible SNF placement.CSW discussed insurance authorization process. Patient expressed being hopeful for rehab and to feel better soon. No further questions reported at this time. CSW to continue to follow and assist with discharge planning needs.   Patients passr under review. CSW submitted clinicals to passr for review.  Expected Discharge Plan: Skilled Nursing Facility Barriers to Discharge: Continued Medical Work up   Patient Goals and CMS Choice Patient states their goals for this hospitalization and ongoing recovery are:: SNF CMS Medicare.gov Compare Post Acute Care list provided to:: Patient Choice offered to / list presented to : Patient      Expected Discharge Plan and Services In-house Referral: Clinical Social Work     Living arrangements for the past 2 months: Homeless Shelter                                      Prior Living Arrangements/Services Living arrangements for the past 2 months: Homeless Shelter Lives with::  (From homeless shelter) Patient language and need for interpreter reviewed:: Yes Do you feel safe going back to the place where you live?: No   SNF  Need for Family Participation in Patient Care: Yes (Comment) Care giver support system in place?: Yes (comment)   Criminal  Activity/Legal Involvement Pertinent to Current Situation/Hospitalization: No - Comment as needed  Activities of Daily Living Home Assistive Devices/Equipment: None ADL Screening (condition at time of admission) Patient's cognitive ability adequate to safely complete daily activities?: Yes Is the patient deaf or have difficulty hearing?: No Does the patient have difficulty seeing, even when wearing glasses/contacts?: No Does the patient have difficulty concentrating, remembering, or making decisions?: No Patient able to express need for assistance with ADLs?: Yes Does the patient have difficulty dressing or bathing?: No Independently performs ADLs?: Yes (appropriate for developmental age) Does the patient have difficulty walking or climbing stairs?: No Weakness of Legs: None Weakness of Arms/Hands: None  Permission Sought/Granted Permission sought to share information with : Case Manager, Family Supports, Chartered certified accountant granted to share information with : Yes, Verbal Permission Granted  Share Information with NAME: Gaspar Bidding  Permission granted to share info w AGENCY: SNF  Permission granted to share info w Relationship: son  Permission granted to share info w Contact Information: Gaspar Bidding [phone]  Emotional Assessment   Attitude/Demeanor/Rapport: Gracious Affect (typically observed): Calm Orientation: : Oriented to Self, Oriented to Place, Oriented to  Time, Oriented to Situation Alcohol / Substance Use: Not Applicable Psych Involvement: No (comment)  Admission diagnosis:  Acute on chronic systolic heart failure (HCC) [I50.23] Hypokalemia [E87.6] Acute congestive heart failure, unspecified heart failure type (HCC) [I50.9] CHF (congestive heart failure) ([place]) [I50.9] Heart failure ([place]) [  I50.9] Patient Active Problem List   Diagnosis Date Noted   Heart failure ([place]) 08/04/2022   COPD with acute exacerbation ([place]) 08/04/2022   Acute kidney injury  superimposed on chronic kidney disease ([place]) 08/04/2022   Acute on chronic systolic heart failure ([place]) 08/03/2022   Bronchitis 08/03/2022   CHF (congestive heart failure) ([place]) 08/03/2022   Acute on chronic systolic CHF (congestive heart failure) ([place]) 05/15/2022   Acute pyelonephritis 07/17/2020   Homelessness 06/12/2020   Bipolar 1 disorder ([place]) 12/18/2018   Suicide attempt ([place])    Overdose, intentional self-harm, initial encounter ([place]) 12/13/2018   Bipolar I disorder, single manic episode, severe, with psychosis ([place]) 06/09/2018   Bipolar disorder ([place]) 06/09/2018   DKA, type 2 ([place]) 01/06/2018   Diabetic acidosis without coma ([place])    Adjustment disorder with depressed mood 01/04/2018   Oral thrush    Acute lower UTI    DKA, type 2, not at goal [facility]) 01/03/2018   Thrush 01/03/2018   DM (diabetes mellitus), type 2, uncontrolled 06/07/2017   Abscess of groin, right 06/07/2017   Abscess 06/07/2017   Bipolar disorder with moderate depression ([place]) 08/31/2015   Tobacco use disorder 08/28/2015   HTN (hypertension) 08/28/2015   Type 2 diabetes mellitus with hyperlipidemia ([place]) 08/28/2015   Hyponatremia 08/28/2015   T2DM (type 2 diabetes mellitus) ([place]) 10/26/2009   OBESITY 10/26/2009   Essential hypertension 10/26/2009   DISTURBANCE OF SKIN SENSATION 10/26/2009   Migraine headache 03/05/2007   PCP:  Patient, No Pcp Per Pharmacy:   [facility] [address] [place] [address] Phone: [phone] Fax: [phone]  [facility] [place], [place] [address] Phone: [phone] Fax: [phone]  [place], [place] [address] Phone: [phone] Fax: [phone]  [facility] [place], [place] AT [facility] [place] [address] Phone: [phone] Fax: [phone]     Social Determinants of Health (SDOH) Social History: [place]: No Food Insecurity (05/16/2022)  Housing: Low Risk  (05/16/2022)  Transportation Needs: Unmet Transportation Needs (05/16/2022)  Utilities: Not At Risk (05/16/2022)  Alcohol Screen: Low Risk  (12/18/2018)  Depression (PHQ2-9): Medium Risk (07/30/2020)  Financial Resource Strain: Low Risk  (05/16/2022)  Tobacco Use: High Risk (08/03/2022)   SDOH Interventions:     Readmission Risk Interventions     No data to display

## 2022-08-05 NOTE — Progress Notes (Addendum)
RE: [name] [name]  Date of Birth: 1971/03/22  Date 08/05/2022  To Whom It May Concern:  Please be advised that the above-named patient will require a short-term nursing home stay - anticipated 30 days or less for rehabilitation and strengthening. The plan is for return home.

## 2022-08-05 NOTE — NC FL2 (Signed)
[place] LEVEL OF CARE FORM     IDENTIFICATION  Patient Name: [name] [name] Birthdate: 1971-01-13 Sex: female Admission Date (Current Location): 08/03/2022  [facility] and [place] Number:  Herbalist and Address:  The [place]. [facility], [place] [address], [place], [place] [address]      Provider Number: [id number]  Attending Physician Name and Address:  Tawni Millers,*  Relative Name and Phone Number:  Gaspar Bidding (son) [phone]    Current Level of Care: Hospital Recommended Level of Care: [place] Prior Approval Number:    Date Approved/Denied:   PASRR Number: PASRR under review  Discharge Plan: SNF    Current Diagnoses: Patient Active Problem List   Diagnosis Date Noted   Heart failure ([place]) 08/04/2022   COPD with acute exacerbation ([place]) 08/04/2022   Acute kidney injury superimposed on chronic kidney disease ([place]) 08/04/2022   Acute on chronic systolic heart failure ([place]) 08/03/2022   Bronchitis 08/03/2022   CHF (congestive heart failure) ([place]) 08/03/2022   Acute on chronic systolic CHF (congestive heart failure) ([place]) 05/15/2022   Acute pyelonephritis 07/17/2020   Homelessness 06/12/2020   Bipolar 1 disorder ([place]) 12/18/2018   Suicide attempt ([place])    Overdose, intentional self-harm, initial encounter ([place]) 12/13/2018   Bipolar I disorder, single manic episode, severe, with psychosis ([place]) 06/09/2018   Bipolar disorder ([place]) 06/09/2018   DKA, type 2 ([place]) 01/06/2018   Diabetic acidosis without coma ([place])    Adjustment disorder with depressed mood 01/04/2018   Oral thrush    Acute lower UTI    DKA, type 2, not at goal [facility]) 01/03/2018   Thrush 01/03/2018   DM (diabetes mellitus), type 2, uncontrolled 06/07/2017   Abscess of groin, right 06/07/2017   Abscess 06/07/2017   Bipolar disorder with moderate depression ([place]) 08/31/2015   Tobacco use disorder 08/28/2015   HTN (hypertension) 08/28/2015    Type 2 diabetes mellitus with hyperlipidemia ([place]) 08/28/2015   Hyponatremia 08/28/2015   T2DM (type 2 diabetes mellitus) ([place]) 10/26/2009   OBESITY 10/26/2009   Essential hypertension 10/26/2009   DISTURBANCE OF SKIN SENSATION 10/26/2009   Migraine headache 03/05/2007    Orientation RESPIRATION BLADDER Height & Weight     Self, Time, Situation, Place (WDL)  Normal Continent Weight: 182 lb 1.6 oz (82.6 kg) Height:  '5\' 5"'$  (165.1 cm)  BEHAVIORAL SYMPTOMS/MOOD NEUROLOGICAL BOWEL NUTRITION STATUS      Continent (WDL) Diet (Please see discharge summary)  AMBULATORY STATUS COMMUNICATION OF NEEDS Skin   Limited Assist Verbally Other (Comment) (WDL, Wound/Incision LDAs)                       Personal Care Assistance Level of Assistance  Bathing, Feeding, Dressing Bathing Assistance: Limited assistance Feeding assistance: Independent Dressing Assistance: Limited assistance     Functional Limitations Info  Sight, Hearing, Speech Sight Info: Adequate Hearing Info: Adequate Speech Info: Adequate    SPECIAL CARE FACTORS FREQUENCY  PT (By licensed PT), OT (By licensed OT)     PT Frequency: 5x min weekly OT Frequency: 5x min weekly            Contractures Contractures Info: Not present    Additional Factors Info  Code Status, Allergies Code Status Info: FULL Allergies Info: Tramadol,Wellbutrin (bupropion),Nicotine           Current Medications (08/05/2022):  This is the current hospital active medication list Current Facility-Administered Medications  Medication Dose Route Frequency  Provider Last Rate Last Admin   0.9 %  sodium chloride infusion  250 mL Intravenous PRN Wynetta Fines T, MD       oxyCODONE (Oxy IR/ROXICODONE) immediate release tablet 10 mg  10 mg Oral Q4H PRN Wynetta Fines T, MD   10 mg at 08/05/22 1353   And   acetaminophen (TYLENOL) tablet 325 mg  325 mg Oral Q4H PRN Wynetta Fines T, MD   325 mg at 08/04/22 2217   acetaminophen (TYLENOL) tablet 650 mg   650 mg Oral Q4H PRN Wynetta Fines T, MD   650 mg at 08/05/22 0937   albuterol (PROVENTIL) (2.5 MG/3ML) 0.083% nebulizer solution 2.5 mg  2.5 mg Nebulization Q2H PRN Wynetta Fines T, MD       atorvastatin (LIPITOR) tablet 20 mg  20 mg Oral Daily Wynetta Fines T, MD   20 mg at 08/05/22 [id number]   bacitracin-polymyxin b (POLYSPORIN) ophthalmic ointment   Both Eyes BID Wynetta Fines T, MD   1 Application at 0000000 0901   budesonide (PULMICORT) nebulizer solution 0.5 mg  0.5 mg Nebulization Q12H Arrien, Jimmy Picket, MD   0.5 mg at 08/05/22 0859   digoxin (LANOXIN) tablet 0.125 mg  0.125 mg Oral Daily Wynetta Fines T, MD   0.125 mg at 08/05/22 0859   enoxaparin (LOVENOX) injection 40 mg  40 mg Subcutaneous Q24H Wynetta Fines T, MD   40 mg at 08/04/22 2120   fluticasone (FLONASE) 50 MCG/ACT nasal spray 1 spray  1 spray Each Nare Daily Wynetta Fines T, MD   1 spray at 08/05/22 0901   guaiFENesin (MUCINEX) 12 hr tablet 1,200 mg  1,200 mg Oral BID Wynetta Fines T, MD   1,200 mg at 08/05/22 0859   guaiFENesin (ROBITUSSIN) 100 MG/5ML liquid 5 mL  5 mL Oral Q4H PRN Wynetta Fines T, MD       insulin aspart (novoLOG) injection 0-20 Units  0-20 Units Subcutaneous TID WC Wynetta Fines T, MD   3 Units at 08/05/22 1303   insulin aspart (novoLOG) injection 0-5 Units  0-5 Units Subcutaneous QHS Wynetta Fines T, MD   2 Units at 08/04/22 2218   insulin glargine-yfgn (SEMGLEE) injection 20 Units  20 Units Subcutaneous Daily Wynetta Fines T, MD   20 Units at 08/05/22 0859   isosorbide mononitrate (IMDUR) 24 hr tablet 60 mg  60 mg Oral Daily Wynetta Fines T, MD   60 mg at 08/05/22 0859   metoprolol succinate (TOPROL-XL) 24 hr tablet 25 mg  25 mg Oral Daily Wynetta Fines T, MD   25 mg at 08/05/22 0859   mirtazapine (REMERON) tablet 30 mg  30 mg Oral QHS Wynetta Fines T, MD   30 mg at 08/04/22 2120   mometasone-formoterol (DULERA) 200-5 MCG/ACT inhaler 2 puff  2 puff Inhalation BID Lequita Halt, MD   2 puff at 08/05/22 0902   multivitamin with minerals  tablet 1 tablet  1 tablet Oral Daily Arrien, Jimmy Picket, MD   1 tablet at 08/05/22 0903   ondansetron [facility]) injection 4 mg  4 mg Intravenous Q6H PRN Wynetta Fines T, MD   4 mg at 08/05/22 [id number]   polyethylene glycol (MIRALAX / GLYCOLAX) packet 17 g  17 g Oral Daily Tawni Millers, MD   17 g at 08/05/22 0859   potassium chloride (KLOR-CON M) CR tablet 20 mEq  20 mEq Oral Daily Wynetta Fines T, MD   20 mEq at 08/05/22 0903   risperiDONE (RISPERDAL) tablet  1 mg  1 mg Oral QHS Wynetta Fines T, MD   1 mg at 08/04/22 2120   sertraline (ZOLOFT) tablet 100 mg  100 mg Oral Daily Wynetta Fines T, MD   100 mg at 08/05/22 0859   sodium chloride flush (NS) 0.9 % injection 3 mL  3 mL Intravenous Q12H Wynetta Fines T, MD   3 mL at 08/05/22 0905   sodium chloride flush (NS) 0.9 % injection 3 mL  3 mL Intravenous PRN Wynetta Fines T, MD       traZODone (DESYREL) tablet 100 mg  100 mg Oral QHS Wynetta Fines T, MD   100 mg at 08/04/22 2120     Discharge Medications: Please see discharge summary for a list of discharge medications.  Relevant Imaging Results:  Relevant Lab Results:   Additional Information [phone]  Milas Gain, LCSWA

## 2022-08-05 NOTE — Progress Notes (Addendum)
Progress Note   Patient: [name] [name] [id number] DOB: 08/07/1970 DOA: 08/03/2022     1 DOS: the patient was seen and examined on 08/05/2022   Brief hospital course: Mrs. Daye was admitted to the hospital with the working diagnosis of heart failure decompensation.   52 yo female with the past medical history of heart failure, COPD/ asthma, T2DM, who presented with dyspnea and edema. Reported one week of cough, dyspnea on exertion and lower extremity edema. Over the last 2 days she increased the dose of furosemide from 40 mg bid to 60 mg bid with no significant improvement of her symptoms. On her initial physical examination her blood pressure was 112/62, HR 75, RR 26 and 02 saturation 94%, lungs with diffuse bilateral wheezing and rales, with increased work of breathing, heart with S1 and S2 present and rhythmic, with no gallops, abdomen with no distention and positive lower extremity edema.   Na 140, K 2,8 CL 99, bicarbonate 29 glucose 316, bun 20 cr 1,27  BNP 1,100  Wbc 5,5 hgb 11,9 plt 176  Sars covid 19 negative   Chest radiograph with cardiomegaly and bilateral hilar vascular congestion with no effusions.  EKG 71 bpm, right axis deviation, qtc 519, sinus rhythm with left atrial enlargement, poor R R wave progression, with no significant ST segment or T wave changes.   Patient was placed on furosemide for diuresis.     Assessment and Plan: * Acute on chronic systolic heart failure (HCC) Echocardiogram with reduced LV systolic function 123456, global hypokinesis, RV systolic function with mild reduction, RVSP 30,7, AL with moderate dilatation, moderate to severe mitral regurgitation.  Not documented urine output.   Continue digoxin and metoprolol. Continue with isosorbide.  Holding furosemide due to rise in serum cr.    COPD with acute exacerbation (HCC) Positive wheezing on examination.  02 saturation 100 % on room air.  Plan to continue bronchodilator therapy and  inhaled corticosteroids. Hold on antibiotic therapy and systemic corticosteroids.   Type 2 diabetes mellitus with hyperlipidemia (HCC) Uncontrolled T2DM with hyperglycemia.   Fasting glucose 249 mg/dl,  Continue glucose cover and monitoring with insulin sliding scale.  Continue basal insulin. Patient is tolerating po well.    Acute kidney injury superimposed on chronic kidney disease (HCC) CKD stage 3a.   Renal function with serum cr at 2,12 with K at 4,1 and serum bicarbonate at 34.   Continue to hold on furosemide for now.  Follow up renal function in am.  Avoid hypotension and nephrotoxic medications.   Anxiety Bipolar.  Plan to continue with mirtazapine, risperidone and sertraline. Will hold trazodone tonight and will try ambien per patient's request.         Subjective: Patient is feeling better, today with sore throat no chest pain, positive dyspnea on exertion, not back to her baseline.   Physical Exam: Vitals:   08/04/22 1023 08/04/22 1133 08/04/22 1945 08/05/22 0415  BP: 105/79 (!) 120/99 (!) 113/57 104/66  Pulse: 86 93    Resp:  18    Temp:  98 F (36.7 C) 98.5 F (36.9 C) 98.2 F (36.8 C)  TempSrc:  Oral Oral Oral  SpO2:   94% 96%  Weight:      Height:       Neurology awake and alert ENT with mild pallor Cardiovascular with S1 and S2 present and rhythmic with positive systolic murmur at the apex No JVD No lower extremity edema Respiratory with mild expiratory wheezing with no rhonchi  or rales  Abdomen with no distention  Data Reviewed:    Family Communication: no family at the bedside   Disposition: Status is: Inpatient Remains inpatient appropriate because: renal failure    Planned Discharge Destination: Home      Author: Tawni Millers, MD 08/05/2022 8:02 AM  For on call review [email or web address].

## 2022-08-05 NOTE — Assessment & Plan Note (Signed)
Bipolar.  Plan to continue with mirtazapine, risperidone and sertraline. Will hold trazodone tonight and will try ambien per patient's request.

## 2022-08-06 ENCOUNTER — Other Ambulatory Visit (HOSPITAL_COMMUNITY): Payer: Self-pay

## 2022-08-06 DIAGNOSIS — I5023 Acute on chronic systolic (congestive) heart failure: Secondary | ICD-10-CM | POA: Diagnosis not present

## 2022-08-06 DIAGNOSIS — I82890 Acute embolism and thrombosis of other specified veins: Secondary | ICD-10-CM | POA: Diagnosis present

## 2022-08-06 DIAGNOSIS — J441 Chronic obstructive pulmonary disease with (acute) exacerbation: Secondary | ICD-10-CM | POA: Diagnosis not present

## 2022-08-06 DIAGNOSIS — N179 Acute kidney failure, unspecified: Secondary | ICD-10-CM | POA: Diagnosis not present

## 2022-08-06 DIAGNOSIS — E1169 Type 2 diabetes mellitus with other specified complication: Secondary | ICD-10-CM | POA: Diagnosis not present

## 2022-08-06 DIAGNOSIS — F419 Anxiety disorder, unspecified: Secondary | ICD-10-CM

## 2022-08-06 LAB — BASIC METABOLIC PANEL
Anion gap: 11 (ref 5–15)
BUN: 44 mg/dL — ABNORMAL HIGH (ref 6–20)
CO2: 27 mmol/L (ref 22–32)
Calcium: 8.6 mg/dL — ABNORMAL LOW (ref 8.9–10.3)
Chloride: 96 mmol/L — ABNORMAL LOW (ref 98–111)
Creatinine, Ser: 1.54 mg/dL — ABNORMAL HIGH (ref 0.44–1.00)
GFR, Estimated: 40 mL/min — ABNORMAL LOW (ref >60)
Glucose, Bld: 250 mg/dL — ABNORMAL HIGH (ref 70–99)
Potassium: 4.4 mmol/L (ref 3.5–5.1)
Sodium: 134 mmol/L — ABNORMAL LOW (ref 135–145)

## 2022-08-06 LAB — GLUCOSE, CAPILLARY
Glucose-Capillary: 174 mg/dL — ABNORMAL HIGH (ref 70–99)
Glucose-Capillary: 147 mg/dL — ABNORMAL HIGH (ref 70–99)

## 2022-08-06 MED ORDER — BUMETANIDE 2 MG PO TABS
2.0000 mg | ORAL_TABLET | Freq: Every day | ORAL | Status: DC
  Administered 2022-08-06: 2 mg via ORAL
  Filled 2022-08-06: qty 1

## 2022-08-06 MED ORDER — ALBUTEROL SULFATE (2.5 MG/3ML) 0.083% IN NEBU
2.5000 mg | INHALATION_SOLUTION | Freq: Four times a day (QID) | RESPIRATORY_TRACT | 12 refills | Status: DC | PRN
  Filled 2022-08-06: qty 90, 8d supply, fill #0

## 2022-08-06 MED ORDER — ACETAMINOPHEN 325 MG PO TABS: 650.0000 mg | ORAL_TABLET | ORAL | Status: DC | PRN

## 2022-08-06 MED ORDER — METOPROLOL SUCCINATE ER 25 MG PO TB24
25.0000 mg | ORAL_TABLET | Freq: Every day | ORAL | 0 refills | Status: DC
  Filled 2022-08-06: qty 30, 30d supply, fill #0

## 2022-08-06 MED ORDER — RISPERIDONE 2 MG PO TABS
ORAL_TABLET | ORAL | 0 refills | Status: DC
  Filled 2022-08-06: qty 60, 30d supply, fill #0

## 2022-08-06 MED ORDER — TRAZODONE HCL 50 MG PO TABS
100.0000 mg | ORAL_TABLET | Freq: Every day | ORAL | 0 refills | Status: DC
  Filled 2022-08-06: qty 60, 30d supply, fill #0

## 2022-08-06 MED ORDER — FLUTICASONE-SALMETEROL 250-50 MCG/ACT IN AEPB: 1.0000 | INHALATION_SPRAY | Freq: Two times a day (BID) | RESPIRATORY_TRACT | Status: DC

## 2022-08-06 MED ORDER — ISOSORBIDE MONONITRATE ER 60 MG PO TB24
60.0000 mg | ORAL_TABLET | Freq: Every day | ORAL | 0 refills | Status: DC
  Filled 2022-08-06: qty 30, 30d supply, fill #0

## 2022-08-06 MED ORDER — BUMETANIDE 2 MG PO TABS
2.0000 mg | ORAL_TABLET | Freq: Two times a day (BID) | ORAL | 0 refills | Status: DC
  Filled 2022-08-06: qty 60, 30d supply, fill #0

## 2022-08-06 NOTE — Progress Notes (Signed)
Patient ambulated in hallway oxygen level was 96-97% on room air. Patient tolerated well.

## 2022-08-06 NOTE — Progress Notes (Signed)
Physical Therapy Treatment Patient Details Name: [name] [name] MRN: [id number] DOB: [birth year] Today's Date: 08/06/2022   History of Present Illness [name] [name] is a 52 y.o. female who presented with worsening shortness of breath and leg swelling. PMH includes: nonischemic cardiomyopathy, severe MR, schizoaffective disorder, COPD/asthma, cigarette smoker, IIDM, noncompliant with medications    PT Comments    Pt tolerated treatment well today. Pt presents at baseline function and was able to ambulate in hallway independently. Pt has no further acute PT needs. Reconsult PT if mobility status changes.   Recommendations for follow up therapy are one component of a multi-disciplinary discharge planning process, led by the attending physician.  Recommendations may be updated based on patient status, additional functional criteria and insurance authorization.  Follow Up Recommendations  No PT follow up Can patient physically be transported by private vehicle: Yes   Assistance Recommended at Discharge PRN  Patient can return home with the following Assist for transportation   Equipment Recommendations  None recommended by PT    Recommendations for Other Services       Precautions / Restrictions Precautions Precautions: Fall Restrictions Weight Bearing Restrictions: No     Mobility  Bed Mobility               General bed mobility comments: Pt seated EOB upon arrival    Transfers Overall transfer level: Independent Equipment used: None Transfers: Sit to/from Stand Sit to Stand: Independent                Ambulation/Gait Ambulation/Gait assistance: Independent Gait Distance (Feet): 600 Feet Assistive device: None Gait Pattern/deviations: Antalgic, Decreased stance time - right, Decreased step length - right Gait velocity: Variable speeds         Stairs             Wheelchair Mobility    Modified Rankin (Stroke Patients Only)        Balance Overall balance assessment: No apparent balance deficits (not formally assessed)                                          Cognition Arousal/Alertness: Awake/alert Behavior During Therapy: WFL for tasks assessed/performed Overall Cognitive Status: Within Functional Limits for tasks assessed                                          Exercises      General Comments General comments (skin integrity, edema, etc.): VSS. Pt O2 sat remained above 95% during ambulation      Pertinent Vitals/Pain Pain Assessment Pain Assessment: No/denies pain    Home Living                          Prior Function            PT Goals (current goals can now be found in the care plan section) Progress towards PT goals: Goals met/education completed, patient discharged from PT    Frequency    Other (Comment) (Signing off)      PT Plan Discharge plan needs to be updated    Co-evaluation              AM-PAC PT "6 Clicks" Mobility   Outcome Measure  Help needed turning  from your back to your side while in a flat bed without using bedrails?: None Help needed moving from lying on your back to sitting on the side of a flat bed without using bedrails?: None Help needed moving to and from a bed to a chair (including a wheelchair)?: None Help needed standing up from a chair using your arms (e.g., wheelchair or bedside chair)?: None Help needed to walk in hospital room?: None Help needed climbing 3-5 steps with a railing? : None 6 Click Score: 24    End of Session   Activity Tolerance: Patient tolerated treatment well Patient left: in bed;with call bell/phone within reach (Seated EOB) Nurse Communication: Mobility status PT Visit Diagnosis: Other abnormalities of gait and mobility (R26.89);Muscle weakness (generalized) (M62.81)     Time: [id number] PT Time Calculation (min) (ACUTE ONLY): 15 min  Charges:  $Gait Training: 8-22  mins                     [name] [name], PT, DPT Acute Rehab Services [id number]    [name] [name] 08/06/2022, 12:24 PM

## 2022-08-06 NOTE — Progress Notes (Signed)
Discharge instructions reviewed with patient utilizing teach back method, no questions at this time.  TOC pharmacy medications given to patient and patient discharged to home.

## 2022-08-06 NOTE — TOC Transition Note (Signed)
Transition of Care [facility]) - CM/SW Discharge Note   Patient Details  Name: [name] [name] MRN: [id number] Date of Birth: 03-31-1971  Transition of Care [facility]) CM/SW Contact:  Bethena Roys, RN Phone Number: 08/06/2022, 12:17 PM   Clinical Narrative: Risk for readmission assessment completed. First day following  the patient- Plans changed from SNF; patient is now ambulating in the halls and she will not need any HH services. Case Manager received consult for nebulizer machine. DME ordered via [employer] and has been delivered to the hospital. Patient relayed to Case Manager that she has oxygen; however, patient is unable to state the name of the company. Patient states it was a company out of [employer] Case Manager did call several agencies and finally found that the patient is active with [employer] in [place]. Orders for oxygen are nocturnal use only for the [facility] location. Case Manager spoke with patient and she states she has been staying at the [facility]. Case Manager called the [facility] @  [phone] and spoke with [facility]. Belinda states the concentrator is at this location. Raynelle Fanning is in the process of trying to pick the concentrator up if the patient is going to reside in [place] is aware. Case Manager did speak with MD regarding a portable tank for delivery to the hospital. Raynelle Fanning states: They cannot provide a portable tank due to nocturnal orders. Patient will need new orders for continuous oxygen if a portable is needed. Raynelle Fanning will not be able to service-out of network and will need to send new orders to the [place] location. Patient ambulated with Physical Therapy and per notes patient did not drop below 95% with ambulation. MD states patient is appropriate to discharge without new orders for oxygen and a portable tank at this time; patient to continue on nocturnal orders that she was previously on. Case Manager added PCP at the [facility] on the AV[facility]-  patient to call for an appointment if she decides to continue to stay at the [facility].    Final next level of care: Home/Self Care [facility]- her belongings are at this location.) Barriers to Discharge: No Barriers Identified   Patient Goals and CMS Choice CMS Medicare.gov Compare Post Acute Care list provided to:: Patient Choice offered to / list presented to : Patient  Discharge Plan and Services Additional resources added to the After Visit Summary for   In-house Referral: Clinical Social Work Discharge Planning Services: CM Consult            DME Arranged: [facility] DME Agency: [employer] Date DME Agency Contacted: 08/06/22 Time DME Agency Contacted: [id number] Representative spoke with at DME Agency: [place] Determinants of Health ([place]) Interventions SDOH Screenings   Food Insecurity: No Food Insecurity (05/16/2022)  Housing: Low Risk  (05/16/2022)  Transportation Needs: Unmet Transportation Needs (05/16/2022)  Utilities: Not At Risk (05/16/2022)  Alcohol Screen: Low Risk  (12/18/2018)  Depression (PHQ2-9): Medium Risk (07/30/2020)  Financial Resource Strain: Low Risk  (05/16/2022)  Tobacco Use: High Risk (08/03/2022)     Readmission Risk Interventions    08/06/2022   12:11 PM  Readmission Risk Prevention Plan  Transportation Screening Complete  Medication Review (RN Care Manager) Referral to Pharmacy  HRI or [place] Complete  SW Recovery Care/Counseling Consult Complete  [place] Not Applicable

## 2022-08-06 NOTE — Assessment & Plan Note (Addendum)
Old records personally reviewed, patient had a right upper extremity superficial thrombosis due to infiltrate IV access and required 3 month of apixaban in 2023. Currently no further indication for apixaban.

## 2022-08-06 NOTE — Discharge Summary (Addendum)
Physician Discharge Summary   Patient: [name] [name] MRN: [id number] DOB: December 14, 1970  Admit date:     08/03/2022  Discharge date: 08/06/22  Discharge Physician: Jimmy Picket Wisdom Seybold   PCP: Patient, No Pcp Per   Recommendations at discharge:    Patient will continue taking bumtanide for diuresis.  I have reviewed medical records from last hospitalization and recent encounters with [facility] [place].  Patient will continue taking risperidone as instructed.  Patient will need close follow up as outpatient, frequent hospitalizations She lives in a shelter.  Follow up with primary care in 7 to 10 days. Follow up renal function and electrolytes in 7 days.  At the time of her discharge patient will continue with nocturnal supplemental 02 per [place], currently she does not desaturates on ambulation on room air.   Discharge Diagnoses: Principal Problem:   Acute on chronic systolic heart failure (HCC) Active Problems:   COPD with acute exacerbation (HCC)   Type 2 diabetes mellitus with hyperlipidemia (HCC)   Acute kidney injury superimposed on chronic kidney disease ([place])   Anxiety   Superficial vein thrombosis  Resolved Problems:   * No resolved hospital problems. [facility] Course: [name] was admitted to the hospital with the working diagnosis of heart failure decompensation.   52 yo female with the past medical history of heart failure, COPD/ asthma, T2DM, who presented with dyspnea and edema. Reported one week of cough, dyspnea on exertion and lower extremity edema. Over the last 2 days she increased the dose of furosemide from 40 mg bid to 60 mg bid with no significant improvement of her symptoms. On her initial physical examination her blood pressure was 112/62, HR 75, RR 26 and 02 saturation 94%, lungs with diffuse bilateral wheezing and rales, with increased work of breathing, heart with S1 and S2 present and rhythmic, with no gallops, abdomen with no distention and  positive lower extremity edema.   Na 140, K 2,8 CL 99, bicarbonate 29 glucose 316, bun 20 cr 1,27  BNP 1,100  Wbc 5,5 hgb 11,9 plt 176  Sars covid 19 negative   Chest radiograph with cardiomegaly and bilateral hilar vascular congestion with no effusions.  EKG 71 bpm, right axis deviation, qtc 519, sinus rhythm with left atrial enlargement, poor R R wave progression, with no significant ST segment or T wave changes.   Patient was placed on furosemide for diuresis.   02/26 volume status improving, pending stabilization of renal function.  02/27 renal function has improved, patient will continue oral diuretic therapy at home and will need close follow up as outpatient.   Assessment and Plan: * Acute on chronic systolic heart failure (HCC) Echocardiogram with reduced LV systolic function 123456, global hypokinesis, RV systolic function with mild reduction, RVSP 30,7, AL with moderate dilatation, moderate to severe mitral regurgitation.  Patient was placed on IV furosemide with improvement of her symptoms.   Continue metoprolol and isosorbide for heart failure management.  Resume bumetanide. Follow up with Cardiology as outpatient for mitral regurgitation.      COPD with acute exacerbation [facility]) Patient had mild exacerbation of COPD/asthma during her hospitalization.  Patient was treated with bronchodilator therapy and inhaled corticosteroids. Pneumonia ruled out.  Type 2 diabetes mellitus with hyperlipidemia (HCC) Uncontrolled T2DM with hyperglycemia.   Patient received insulin during his hospitalization, basal and sliding scale.  Plan to continue close follow up as outpatient.    Continue with statin therapy.   Acute kidney injury superimposed  on chronic kidney disease ([place]) CKD stage 3a. Hypokalemia.   Patient with clinically improved in volume status, renal function at the time of her discharge has a serum cr of 1,54 with K at 4,4 and serum bicarbonate at 27. Na 134    Patient will resume bumetanide and K supplementation.  Follow up renal function and electrolytes in 7 days as outpatient.   Anxiety Bipolar.   I have reviewed records from [facility] last hospitalization on 05/2022, psychiatry recommendations to discontinue sertraline.  She had a short course of lorazepam and instruction to take risperdal 1 mg in am and 2 mg at bedtime.   Superficial vein thrombosis Old records personally reviewed, patient had a right upper extremity superficial thrombosis due to infiltrate IV access and required 3 month of apixaban in 2023. Currently no further indication for apixaban.          Consultants: none  Procedures performed: none   Disposition: Home Diet recommendation:  Discharge Diet Orders (From admission, onward)     Start     Ordered   08/06/22 0000  Diet - low sodium heart healthy        08/06/22 0948           Cardiac and Carb modified diet DISCHARGE MEDICATION: Allergies as of 08/06/2022       Reactions   Tramadol Nausea Only   "feeling like I'm on fire"   Wellbutrin [bupropion] Other (See Comments)   rage   Nicotine Rash   Patient states she is allergic to the Nicotine patch and they cause her to break out in a rash.  She states she can smoke cigarettes.        Medication List     STOP taking these medications    digoxin 0.125 MG tablet Commonly known as: LANOXIN   doxycycline 100 MG capsule Commonly known as: VIBRAMYCIN   Eliquis 5 MG Tabs tablet Generic drug: apixaban   furosemide 40 MG tablet Commonly known as: LASIX   mirtazapine 30 MG tablet Commonly known as: REMERON   sertraline 100 MG tablet Commonly known as: ZOLOFT   traZODone 50 MG tablet Commonly known as: DESYREL       TAKE these medications    acetaminophen 325 MG tablet Commonly known as: TYLENOL Take 2 tablets (650 mg total) by mouth every 4 (four) hours as needed for headache or mild pain.   albuterol (2.5 MG/3ML) 0.083%  nebulizer solution Commonly known as: PROVENTIL Take 3 mLs (2.5 mg total) by nebulization every 6 (six) hours as needed for shortness of breath.   Aspirin Low Dose 81 MG tablet Generic drug: aspirin EC Take 81 mg by mouth daily.   atorvastatin 20 MG tablet Commonly known as: LIPITOR Take 20 mg by mouth daily.   blood glucose meter kit and supplies Dispense based on patient and insurance preference. Use up to four times daily as directed. (FOR ICD-10 E10.9, E11.9).   bumetanide 2 MG tablet Commonly known as: BUMEX Take 1 tablet (2 mg total) by mouth 2 (two) times daily.   fluticasone-salmeterol 250-50 MCG/ACT Aepb Commonly known as: Advair Diskus Inhale 1 puff into the lungs in the morning and at bedtime.   guaiFENesin 100 MG/5ML liquid Commonly known as: ROBITUSSIN Take 5 mLs by mouth every 4 (four) hours as needed for cough or to loosen phlegm.   isosorbide mononitrate 60 MG 24 hr tablet Commonly known as: IMDUR Take 1 tablet (60 mg total) by mouth daily.  Lantus SoloStar 100 UNIT/ML Solostar Pen Generic drug: insulin glargine Inject 20 Units into the skin in the morning. Do not take if blood sugar is <100.   metoprolol succinate 25 MG 24 hr tablet Commonly known as: TOPROL-XL Take 1 tablet (25 mg total) by mouth daily. What changed: how much to take   oxyCODONE-acetaminophen 10-325 MG tablet Commonly known as: PERCOCET Take 1 tablet by mouth every 4 (four) hours as needed for pain.   potassium chloride SA 20 MEQ tablet Commonly known as: KLOR-CON M Take 20 mEq by mouth daily.   risperiDONE 2 MG tablet Commonly known as: RISPERDAL Take half tablet in the morning and one tablet in the evening. What changed:  medication strength how much to take how to take this when to take this additional instructions               Durable Medical Equipment  (From admission, onward)           Start     Ordered   08/06/22 0000  For home use only DME Nebulizer  machine       Question Answer Comment  Patient needs a nebulizer to treat with the following condition Bronchitis   Length of Need 12 Months      08/06/22 0956            Discharge Exam: Filed Weights   08/03/22 1049 08/04/22 0847 08/06/22 0557  Weight: 79.4 kg 82.6 kg 84.3 kg   BP 122/77   Pulse 87   Temp 98.1 F (36.7 C) (Oral)   Resp 18   Ht '5\' 5"'$  (1.651 m)   Wt 84.3 kg   SpO2 92%   BMI 30.92 kg/m   Patient is feeling better, dyspnea has improved.   Neurology awake and alert ENT with mild pallor Cardiovascular with S1 and S2 present and rhythmic, positive systolic murmur at the apex Respiratory with no wheezing or rhonchi Abdomen with no distention  No lower extremity edema.   Condition at discharge: stable  The results of significant diagnostics from this hospitalization (including imaging, microbiology, ancillary and laboratory) are listed below for reference.   Imaging Studies: DG Chest Port 1 View  Result Date: 08/03/2022 CLINICAL DATA:  Cough and shortness of breath EXAM: PORTABLE CHEST 1 VIEW COMPARISON:  07/07/2022 and prior studies FINDINGS: This is a low volume study.  Telemetry leads overlie the chest. Cardiomegaly again noted. There is no evidence of focal airspace disease, pulmonary edema, suspicious pulmonary nodule/mass, pleural effusion, or pneumothorax. No acute bony abnormalities are identified. IMPRESSION: Cardiomegaly without evidence of acute cardiopulmonary disease. Electronically Signed   By: Margarette Canada M.D.   On: 08/03/2022 13:29   CT ABDOMEN PELVIS W CONTRAST  Result Date: 07/22/2022 CLINICAL DATA:  Abdominal pain, acute, nonlocalized. EXAM: CT ABDOMEN AND PELVIS WITH CONTRAST TECHNIQUE: Multidetector CT imaging of the abdomen and pelvis was performed using the standard protocol following bolus administration of intravenous contrast. RADIATION DOSE REDUCTION: This exam was performed according to the departmental dose-optimization program  which includes automated exposure control, adjustment of the mA and/or kV according to patient size and/or use of iterative reconstruction technique. CONTRAST:  58m OMNIPAQUE IOHEXOL 300 MG/ML  SOLN COMPARISON:  None Available. FINDINGS: Lower chest: Mild cardiomegaly with left ventricular dilation. No acute abnormality. Hepatobiliary: No focal liver abnormality is seen. No gallstones, gallbladder wall thickening, or biliary dilatation. Pancreas: Unremarkable Spleen: Unremarkable Adrenals/Urinary Tract: The adrenal glands are unremarkable. The kidneys are normal in size  and position. Mild asymmetric right renal cortical scarring. Simple cortical cyst noted within the interpolar region of the right kidney for which no follow-up imaging is recommended. 15 mm indeterminate low-attenuation lesion within the interpolar region of the left kidney appears stable since prior examination but may contain enhancing internal components and is indeterminate. No hydronephrosis. No intrarenal or ureteral calculi. The bladder is unremarkable. Stomach/Bowel: Moderate colonic stool burden without evidence of obstruction. The stomach,, small bowel, and large bowel are otherwise unremarkable. No evidence of obstruction or focal inflammation. Appendix normal. No free intraperitoneal gas or fluid. Vascular/Lymphatic: Aortic atherosclerosis. No enlarged abdominal or pelvic lymph nodes. Reproductive: Uterus and bilateral adnexa are unremarkable. Other: Small fat containing bilateral inguinal hernia. Musculoskeletal: No acute bone abnormality. No lytic or blastic bone lesion. Degenerative changes are seen at the lumbosacral junction and within the hips bilaterally, asymmetrically more severe within the left hip. IMPRESSION: 1. No acute intra-abdominal pathology identified. 2. Mild cardiomegaly with left ventricular dilation. 3. Moderate colonic stool burden without evidence of obstruction. 4. 15 mm indeterminate low-attenuation lesion  within the interpolar region of the left kidney. This may contain enhancing internal components and is indeterminate. If indicated, dedicated renal mass protocol CT or MRI examination is recommended for further evaluation. 5. Aortic atherosclerosis. Aortic Atherosclerosis (ICD10-I70.0). Electronically Signed   By: Fidela Salisbury M.D.   On: 07/22/2022 03:54   DG Chest 2 View  Result Date: 07/07/2022 CLINICAL DATA:  Shortness of breath EXAM: CHEST - 2 VIEW COMPARISON:  06/16/2022 FINDINGS: Stable cardiomegaly. No focal airspace consolidation, pleural effusion, or pneumothorax. No significant bony abnormality. IMPRESSION: Cardiomegaly without acute cardiopulmonary disease. Electronically Signed   By: Davina Poke D.O.   On: 07/07/2022 10:42    Microbiology: Results for orders placed or performed during the hospital encounter of 08/03/22  Resp panel by RT-PCR (RSV, Flu A&B, Covid) Nasopharyngeal Swab     Status: None   Collection Time: 08/03/22 10:52 AM   Specimen: Nasopharyngeal Swab; Nasal Swab  Result Value Ref Range Status   SARS Coronavirus 2 by RT PCR NEGATIVE NEGATIVE Final    Comment: (NOTE) SARS-CoV-2 target nucleic acids are NOT DETECTED.  The SARS-CoV-2 RNA is generally detectable in upper respiratory specimens during the acute phase of infection. The lowest concentration of SARS-CoV-2 viral copies this assay can detect is 138 copies/mL. A negative result does not preclude SARS-Cov-2 infection and should not be used as the sole basis for treatment or other patient management decisions. A negative result may occur with  improper specimen collection/handling, submission of specimen other than nasopharyngeal swab, presence of viral mutation(s) within the areas targeted by this assay, and inadequate number of viral copies(<138 copies/mL). A negative result must be combined with clinical observations, patient history, and epidemiological information. The expected result is  Negative.  Fact Sheet for Patients:  EntrepreneurPulse.com.au  Fact Sheet for Healthcare Providers:  IncredibleEmployment.be  This test is no t yet approved or cleared by the [place] FDA and  has been authorized for detection and/or diagnosis of SARS-CoV-2 by FDA under an Emergency Use Authorization (EUA). This EUA will remain  in effect (meaning this test can be used) for the duration of the COVID-19 declaration under Section 564(b)(1) of the Act, 21 U.S.C.section 360bbb-3(b)(1), unless the authorization is terminated  or revoked sooner.       Influenza A by PCR NEGATIVE NEGATIVE Final   Influenza B by PCR NEGATIVE NEGATIVE Final    Comment: (NOTE) The Xpert Xpress SARS-CoV-2/FLU/RSV plus  assay is intended as an aid in the diagnosis of influenza from Nasopharyngeal swab specimens and should not be used as a sole basis for treatment. Nasal washings and aspirates are unacceptable for Xpert Xpress SARS-CoV-2/FLU/RSV testing.  Fact Sheet for Patients: EntrepreneurPulse.com.au  Fact Sheet for Healthcare Providers: IncredibleEmployment.be  This test is not yet approved or cleared by the [place] FDA and has been authorized for detection and/or diagnosis of SARS-CoV-2 by FDA under an Emergency Use Authorization (EUA). This EUA will remain in effect (meaning this test can be used) for the duration of the COVID-19 declaration under Section 564(b)(1) of the Act, 21 U.S.C. section 360bbb-3(b)(1), unless the authorization is terminated or revoked.     Resp Syncytial Virus by PCR NEGATIVE NEGATIVE Final    Comment: (NOTE) Fact Sheet for Patients: EntrepreneurPulse.com.au  Fact Sheet for Healthcare Providers: IncredibleEmployment.be  This test is not yet approved or cleared by the [place] FDA and has been authorized for detection and/or diagnosis of  SARS-CoV-2 by FDA under an Emergency Use Authorization (EUA). This EUA will remain in effect (meaning this test can be used) for the duration of the COVID-19 declaration under Section 564(b)(1) of the Act, 21 U.S.C. section 360bbb-3(b)(1), unless the authorization is terminated or revoked.  Performed at [facility], [address], [place], [place] [address]     Labs: CBC: Recent Labs  Lab 08/03/22 1152  WBC 5.5  NEUTROABS 3.7  HGB 11.9*  HCT 36.1  MCV 86.6  PLT 0000000   Basic Metabolic Panel: Recent Labs  Lab 08/03/22 1152 08/04/22 0152 08/05/22 0146 08/06/22 0155  NA 140 138 137 134*  K 2.8* 4.0 4.1 4.4  CL 99 98 95* 96*  CO2 29 32 34* 27  GLUCOSE 316* 272* 249* 250*  BUN 20 24* 36* 44*  CREATININE 1.27* 1.63* 2.12* 1.54*  CALCIUM 9.4 8.6* 8.8* 8.6*   Liver Function Tests: Recent Labs  Lab 08/03/22 1152  AST 12*  ALT 17  ALKPHOS 108  BILITOT 0.5  PROT 6.8  ALBUMIN 4.2   CBG: Recent Labs  Lab 08/05/22 0744 08/05/22 1225 08/05/22 1625 08/05/22 2109 08/06/22 0808  GLUCAP 147* 146* 194* 117* 174*    Discharge time spent: greater than 30 minutes.  Signed: Tawni Millers, MD [facility] 08/06/2022

## 2022-08-06 NOTE — Progress Notes (Signed)
CSW consulted to assist with transportation for patient. CSW spoke with patient at beside. Patient informed CSW her son Gaspar Bidding is coming to pick her up, no transportation assistance needed at this time. All questions answered. No further questions reported at this time.CSW called patients son Gaspar Bidding who confirmed he is on his way to pick patient up. CSW informed CM.

## 2022-08-06 NOTE — Progress Notes (Signed)
Pharmacist Heart Failure Core Measure Documentation  Assessment: [name] [name] has an EF documented as < 20% on 05/15/2022 by Echo.  Rationale: Heart failure patients with left ventricular systolic dysfunction (LVSD) and an EF < 40% should be prescribed an angiotensin converting enzyme inhibitor (ACEI) or angiotensin receptor blocker (ARB) at discharge unless a contraindication is documented in the medical record.  This patient is not currently on an ACEI or ARB for HF.  This note is being placed in the record in order to provide documentation that a contraindication to the use of these agents is present for this encounter.  ACE Inhibitor or Angiotensin Receptor Blocker is contraindicated (specify all that apply)  '[]'$   ACEI allergy AND ARB allergy '[]'$   Angioedema '[]'$   Moderate or severe aortic stenosis '[]'$   Hyperkalemia '[]'$   Hypotension '[]'$   Renal artery stenosis '[x]'$   Worsening renal function, preexisting renal disease or dysfunction  Hildred Laser, PharmD Clinical Pharmacist **Pharmacist phone directory can now be found on [email or web address] ([email or web address]).  Listed under [place].

## 2022-08-14 ENCOUNTER — Emergency Department (HOSPITAL_COMMUNITY)
Admission: EM | Admit: 2022-08-14 | Discharge: 2022-08-15 | Disposition: A | Discharge status: Home / Self Care | Payer: Medicaid Other | Source: Home / Self Care | Attending: Emergency Medicine | Admitting: Emergency Medicine

## 2022-08-14 ENCOUNTER — Emergency Department (HOSPITAL_COMMUNITY): Payer: Medicaid Other

## 2022-08-14 ENCOUNTER — Encounter (HOSPITAL_COMMUNITY): Payer: Self-pay | Admitting: Emergency Medicine

## 2022-08-14 ENCOUNTER — Emergency Department (HOSPITAL_COMMUNITY)
Admission: EM | Admit: 2022-08-14 | Discharge: 2022-08-14 | Discharge status: Against Medical Advice | Payer: Medicaid Other | Source: Home / Self Care | Attending: Emergency Medicine | Admitting: Emergency Medicine

## 2022-08-14 ENCOUNTER — Encounter (HOSPITAL_COMMUNITY): Payer: Self-pay

## 2022-08-14 ENCOUNTER — Other Ambulatory Visit: Payer: Self-pay

## 2022-08-14 ENCOUNTER — Emergency Department (HOSPITAL_COMMUNITY)
Admission: EM | Admit: 2022-08-14 | Discharge: 2022-08-14 | Disposition: A | Discharge status: Home / Self Care | Payer: Medicaid Other | Attending: Emergency Medicine | Admitting: Emergency Medicine

## 2022-08-14 DIAGNOSIS — Z79899 Other long term (current) drug therapy: Secondary | ICD-10-CM | POA: Insufficient documentation

## 2022-08-14 DIAGNOSIS — R0789 Other chest pain: Secondary | ICD-10-CM | POA: Insufficient documentation

## 2022-08-14 DIAGNOSIS — I5022 Chronic systolic (congestive) heart failure: Secondary | ICD-10-CM | POA: Insufficient documentation

## 2022-08-14 DIAGNOSIS — I509 Heart failure, unspecified: Secondary | ICD-10-CM | POA: Insufficient documentation

## 2022-08-14 DIAGNOSIS — Z1152 Encounter for screening for COVID-19: Secondary | ICD-10-CM | POA: Insufficient documentation

## 2022-08-14 DIAGNOSIS — G8929 Other chronic pain: Secondary | ICD-10-CM | POA: Insufficient documentation

## 2022-08-14 DIAGNOSIS — N189 Chronic kidney disease, unspecified: Secondary | ICD-10-CM | POA: Insufficient documentation

## 2022-08-14 DIAGNOSIS — E1165 Type 2 diabetes mellitus with hyperglycemia: Secondary | ICD-10-CM | POA: Insufficient documentation

## 2022-08-14 DIAGNOSIS — M25562 Pain in left knee: Secondary | ICD-10-CM | POA: Insufficient documentation

## 2022-08-14 DIAGNOSIS — J449 Chronic obstructive pulmonary disease, unspecified: Secondary | ICD-10-CM | POA: Insufficient documentation

## 2022-08-14 DIAGNOSIS — Z7982 Long term (current) use of aspirin: Secondary | ICD-10-CM | POA: Diagnosis not present

## 2022-08-14 DIAGNOSIS — Z7951 Long term (current) use of inhaled steroids: Secondary | ICD-10-CM | POA: Insufficient documentation

## 2022-08-14 DIAGNOSIS — Z794 Long term (current) use of insulin: Secondary | ICD-10-CM | POA: Insufficient documentation

## 2022-08-14 DIAGNOSIS — M545 Low back pain, unspecified: Secondary | ICD-10-CM | POA: Insufficient documentation

## 2022-08-14 DIAGNOSIS — I110 Hypertensive heart disease with heart failure: Secondary | ICD-10-CM | POA: Insufficient documentation

## 2022-08-14 DIAGNOSIS — R079 Chest pain, unspecified: Secondary | ICD-10-CM

## 2022-08-14 DIAGNOSIS — R7989 Other specified abnormal findings of blood chemistry: Secondary | ICD-10-CM

## 2022-08-14 DIAGNOSIS — R778 Other specified abnormalities of plasma proteins: Secondary | ICD-10-CM | POA: Insufficient documentation

## 2022-08-14 DIAGNOSIS — R739 Hyperglycemia, unspecified: Secondary | ICD-10-CM

## 2022-08-14 DIAGNOSIS — E1122 Type 2 diabetes mellitus with diabetic chronic kidney disease: Secondary | ICD-10-CM | POA: Insufficient documentation

## 2022-08-14 HISTORY — DX: Heart failure, unspecified: I50.9

## 2022-08-14 HISTORY — DX: Encounter for other specified aftercare: Z51.89

## 2022-08-14 LAB — TROPONIN I (HIGH SENSITIVITY)
Troponin I (High Sensitivity): 49 ng/L — ABNORMAL HIGH (ref <18)
Troponin I (High Sensitivity): 50 ng/L — ABNORMAL HIGH (ref <18)

## 2022-08-14 LAB — I-STAT CHEM 8, ED
BUN: 34 mg/dL — ABNORMAL HIGH (ref 6–20)
Calcium, Ion: 1.07 mmol/L — ABNORMAL LOW (ref 1.15–1.40)
Chloride: 101 mmol/L (ref 98–111)
Creatinine, Ser: 1.2 mg/dL — ABNORMAL HIGH (ref 0.44–1.00)
Glucose, Bld: 444 mg/dL — ABNORMAL HIGH (ref 70–99)
HCT: 35 % — ABNORMAL LOW (ref 36.0–46.0)
Hemoglobin: 11.9 g/dL — ABNORMAL LOW (ref 12.0–15.0)
Potassium: 4.3 mmol/L (ref 3.5–5.1)
Sodium: 137 mmol/L (ref 135–145)
TCO2: 27 mmol/L (ref 22–32)

## 2022-08-14 LAB — COMPREHENSIVE METABOLIC PANEL
ALT: 19 U/L (ref 0–44)
ALT: 20 U/L (ref 0–44)
AST: 21 U/L (ref 15–41)
AST: 21 U/L (ref 15–41)
Albumin: 3.5 g/dL (ref 3.5–5.0)
Albumin: 3.5 g/dL (ref 3.5–5.0)
Alkaline Phosphatase: 129 U/L — ABNORMAL HIGH (ref 38–126)
Alkaline Phosphatase: 96 U/L (ref 38–126)
Anion gap: 14 (ref 5–15)
Anion gap: 11 (ref 5–15)
BUN: 26 mg/dL — ABNORMAL HIGH (ref 6–20)
BUN: 27 mg/dL — ABNORMAL HIGH (ref 6–20)
CO2: 23 mmol/L (ref 22–32)
CO2: 28 mmol/L (ref 22–32)
Calcium: 8.5 mg/dL — ABNORMAL LOW (ref 8.9–10.3)
Calcium: 8.1 mg/dL — ABNORMAL LOW (ref 8.9–10.3)
Chloride: 101 mmol/L (ref 98–111)
Chloride: 99 mmol/L (ref 98–111)
Creatinine, Ser: 1.26 mg/dL — ABNORMAL HIGH (ref 0.44–1.00)
Creatinine, Ser: 1.36 mg/dL — ABNORMAL HIGH (ref 0.44–1.00)
GFR, Estimated: 51 mL/min — ABNORMAL LOW (ref >60)
GFR, Estimated: 47 mL/min — ABNORMAL LOW (ref >60)
Glucose, Bld: 427 mg/dL — ABNORMAL HIGH (ref 70–99)
Glucose, Bld: 244 mg/dL — ABNORMAL HIGH (ref 70–99)
Potassium: 3.7 mmol/L (ref 3.5–5.1)
Potassium: 3.5 mmol/L (ref 3.5–5.1)
Sodium: 138 mmol/L (ref 135–145)
Sodium: 138 mmol/L (ref 135–145)
Total Bilirubin: 0.3 mg/dL (ref 0.3–1.2)
Total Bilirubin: 0.6 mg/dL (ref 0.3–1.2)
Total Protein: 6.2 g/dL — ABNORMAL LOW (ref 6.5–8.1)
Total Protein: 5.9 g/dL — ABNORMAL LOW (ref 6.5–8.1)

## 2022-08-14 LAB — CBC WITH DIFFERENTIAL/PLATELET
Abs Immature Granulocytes: 0.02 10*3/uL (ref 0.00–0.07)
Basophils Absolute: 0.1 10*3/uL (ref 0.0–0.1)
Basophils Relative: 1 %
Eosinophils Absolute: 0.1 10*3/uL (ref 0.0–0.5)
Eosinophils Relative: 2 %
HCT: 36.3 % (ref 36.0–46.0)
Hemoglobin: 11.4 g/dL — ABNORMAL LOW (ref 12.0–15.0)
Immature Granulocytes: 0 %
Lymphocytes Relative: 14 %
Lymphs Abs: 1 10*3/uL (ref 0.7–4.0)
MCH: 28.4 pg (ref 26.0–34.0)
MCHC: 31.4 g/dL (ref 30.0–36.0)
MCV: 90.3 fL (ref 80.0–100.0)
Monocytes Absolute: 0.4 10*3/uL (ref 0.1–1.0)
Monocytes Relative: 6 %
Neutro Abs: 5.5 10*3/uL (ref 1.7–7.7)
Neutrophils Relative %: 77 %
Platelets: 193 10*3/uL (ref 150–400)
RBC: 4.02 MIL/uL (ref 3.87–5.11)
RDW: 15 % (ref 11.5–15.5)
WBC: 7.1 10*3/uL (ref 4.0–10.5)
nRBC: 0 % (ref 0.0–0.2)

## 2022-08-14 LAB — I-STAT VENOUS BLOOD GAS, ED
Acid-Base Excess: 3 mmol/L — ABNORMAL HIGH (ref 0.0–2.0)
Bicarbonate: 29.2 mmol/L — ABNORMAL HIGH (ref 20.0–28.0)
Calcium, Ion: 1.09 mmol/L — ABNORMAL LOW (ref 1.15–1.40)
HCT: 33 % — ABNORMAL LOW (ref 36.0–46.0)
Hemoglobin: 11.2 g/dL — ABNORMAL LOW (ref 12.0–15.0)
O2 Saturation: 67 %
Potassium: 3.5 mmol/L (ref 3.5–5.1)
Sodium: 138 mmol/L (ref 135–145)
TCO2: 31 mmol/L (ref 22–32)
pCO2, Ven: 49.9 mmHg (ref 44–60)
pH, Ven: 7.376 (ref 7.25–7.43)
pO2, Ven: 36 mmHg (ref 32–45)

## 2022-08-14 LAB — BRAIN NATRIURETIC PEPTIDE: B Natriuretic Peptide: 1503 pg/mL — ABNORMAL HIGH (ref 0.0–100.0)

## 2022-08-14 LAB — CBG MONITORING, ED
Glucose-Capillary: 487 mg/dL — ABNORMAL HIGH (ref 70–99)
Glucose-Capillary: 347 mg/dL — ABNORMAL HIGH (ref 70–99)
Glucose-Capillary: 132 mg/dL — ABNORMAL HIGH (ref 70–99)

## 2022-08-14 LAB — ETHANOL: Alcohol, Ethyl (B): <10 mg/dL (ref <10)

## 2022-08-14 LAB — CBC
HCT: 34.1 % — ABNORMAL LOW (ref 36.0–46.0)
Hemoglobin: 10.6 g/dL — ABNORMAL LOW (ref 12.0–15.0)
MCH: 27.8 pg (ref 26.0–34.0)
MCHC: 31.1 g/dL (ref 30.0–36.0)
MCV: 89.5 fL (ref 80.0–100.0)
Platelets: 195 10*3/uL (ref 150–400)
RBC: 3.81 MIL/uL — ABNORMAL LOW (ref 3.87–5.11)
RDW: 15.3 % (ref 11.5–15.5)
WBC: 7.8 10*3/uL (ref 4.0–10.5)
nRBC: 0 % (ref 0.0–0.2)

## 2022-08-14 LAB — I-STAT BETA HCG BLOOD, ED (MC, WL, AP ONLY): I-stat hCG, quantitative: <5 m[IU]/mL (ref <5)

## 2022-08-14 LAB — LIPASE, BLOOD: Lipase: 26 U/L (ref 11–51)

## 2022-08-14 LAB — RESP PANEL BY RT-PCR (RSV, FLU A&B, COVID)  RVPGX2
Influenza A by PCR: NEGATIVE
Influenza B by PCR: NEGATIVE
Resp Syncytial Virus by PCR: NEGATIVE
SARS Coronavirus 2 by RT PCR: NEGATIVE

## 2022-08-14 MED ORDER — SODIUM CHLORIDE 0.9 % IV BOLUS: 1000.0000 mL | Freq: Once | INTRAVENOUS | Status: DC

## 2022-08-14 MED ORDER — INSULIN ASPART 100 UNIT/ML IJ SOLN
10.0000 [IU] | Freq: Once | INTRAMUSCULAR | Status: AC
  Administered 2022-08-14: 10 [IU] via SUBCUTANEOUS

## 2022-08-14 MED ORDER — OXYCODONE-ACETAMINOPHEN 5-325 MG PO TABS
1.0000 | ORAL_TABLET | Freq: Once | ORAL | Status: AC
  Administered 2022-08-14: 1 via ORAL
  Filled 2022-08-14: qty 1

## 2022-08-14 MED ORDER — LANTUS SOLOSTAR 100 UNIT/ML ~~LOC~~ SOPN: 20.0000 [IU] | PEN_INJECTOR | Freq: Every morning | SUBCUTANEOUS | 1 refills | Status: DC

## 2022-08-14 MED ORDER — FUROSEMIDE 10 MG/ML IJ SOLN
40.0000 mg | Freq: Once | INTRAMUSCULAR | Status: AC
  Administered 2022-08-14: 40 mg via INTRAVENOUS
  Filled 2022-08-14: qty 4

## 2022-08-14 MED ORDER — ONDANSETRON HCL 4 MG/2ML IJ SOLN: 4.0000 mg | Freq: Once | INTRAMUSCULAR | Status: DC

## 2022-08-14 MED ORDER — OXYCODONE-ACETAMINOPHEN 5-325 MG PO TABS
2.0000 | ORAL_TABLET | Freq: Once | ORAL | Status: AC
  Administered 2022-08-14: 2 via ORAL
  Filled 2022-08-14: qty 2

## 2022-08-14 MED ORDER — ONDANSETRON 4 MG PO TBDP
4.0000 mg | ORAL_TABLET | Freq: Once | ORAL | Status: AC
  Administered 2022-08-14: 4 mg via ORAL
  Filled 2022-08-14: qty 1

## 2022-08-14 MED ORDER — FUROSEMIDE 10 MG/ML IJ SOLN
80.0000 mg | Freq: Once | INTRAMUSCULAR | Status: AC
  Administered 2022-08-14: 80 mg via INTRAVENOUS
  Filled 2022-08-14: qty 8

## 2022-08-14 NOTE — ED Notes (Signed)
Pt requested to have a prescription for two to four pills of oxycodone-acetaminophen until she sees her provider on Sunday.

## 2022-08-14 NOTE — ED Notes (Signed)
Pt state that she pick up her medication in [place] but she hasn't had transportation there d/t homelessness. Pt says this weekend her son will take her to see her PCP to get prescriptions.   Pt says it would be easier to have prescriptions transferred to [facility] since this where she is mostly.

## 2022-08-14 NOTE — ED Notes (Signed)
Pt and another individual help pt with belongings and left hospital [facility]

## 2022-08-14 NOTE — ED Notes (Signed)
Pt states she is now staying. Pt taken to x-ray.

## 2022-08-14 NOTE — ED Triage Notes (Signed)
Patient reports SOB with upper back pain and emesis this evening , CBG= 387 by EMS .

## 2022-08-14 NOTE — ED Triage Notes (Signed)
Pt BIB EMS from [place] for hyperglycemia, has not had insulin in a month and a half. Hx of CHF and diabetes. EMS VS:  160/88 HR 114 97% CBG 517

## 2022-08-14 NOTE — ED Provider Notes (Signed)
[facility] Provider Note   CSN: [id number] Arrival date & time: 08/14/22  1951     History {Add pertinent medical, surgical, social history, OB history to HPI:1} Chief Complaint  Patient presents with  . Shortness of Breath  . Back Pain    KEYLEI FIZER is a 52 y.o. female with past medical history significant for diabetes, obesity, tobacco use disorder, hypertension, hyponatremia, bipolar, homelessness, CHF, COPD who presents with concern for upper back pain, shortness of breath, vomiting, and concern about her diabetes.  CBG on arrival 387.  Patient reports that she is having some left knee pain from a fall several months ago, reports that she has not had any evaluation of the affected extremity.  Notably patient was seen twice already today, at that time she was found to have elevated heart failure enzymes, otherwise without any acute admittable diagnosis.   Shortness of Breath Back Pain      Home Medications Prior to Admission medications   Medication Sig Start Date End Date Taking? Authorizing Provider  acetaminophen (TYLENOL) 325 MG tablet Take 2 tablets (650 mg total) by mouth every 4 (four) hours as needed for headache or mild pain. 08/06/22   Arrien, Jimmy Picket, MD  albuterol (PROVENTIL) (2.5 MG/3ML) 0.083% nebulizer solution Take 3 mLs (2.5 mg total) by nebulization every 6 (six) hours as needed for shortness of breath. 08/06/22   Arrien, Jimmy Picket, MD  ASPIRIN LOW DOSE 81 MG tablet Take 81 mg by mouth daily. 03/15/22   [provider]  atorvastatin (LIPITOR) 20 MG tablet Take 20 mg by mouth daily. 03/15/22 08/04/22  [provider]  blood glucose meter kit and supplies Dispense based on patient and insurance preference. Use up to four times daily as directed. (FOR ICD-10 E10.9, E11.9). 01/06/18   Eugenie Filler, MD  bumetanide (BUMEX) 2 MG tablet Take 1 tablet (2 mg total) by mouth 2 (two) times  daily. 08/06/22 09/05/22  Arrien, Jimmy Picket, MD  fluticasone-salmeterol (ADVAIR DISKUS) 250-50 MCG/ACT AEPB Inhale 1 puff into the lungs in the morning and at bedtime. 08/06/22   Arrien, Jimmy Picket, MD  guaiFENesin (ROBITUSSIN) 100 MG/5ML liquid Take 5 mLs by mouth every 4 (four) hours as needed for cough or to loosen phlegm. 07/07/22   Jeanell Sparrow, DO  insulin glargine (LANTUS SOLOSTAR) 100 UNIT/ML Solostar Pen Inject 20 Units into the skin in the morning. Do not take if blood sugar is <100. 08/14/22   Quintella Reichert, MD  isosorbide mononitrate (IMDUR) 60 MG 24 hr tablet Take 1 tablet (60 mg total) by mouth daily. 08/06/22 09/05/22  Arrien, Jimmy Picket, MD  metoprolol succinate (TOPROL-XL) 25 MG 24 hr tablet Take 1 tablet (25 mg total) by mouth daily. 08/06/22 09/05/22  Arrien, Jimmy Picket, MD  oxyCODONE-acetaminophen (PERCOCET) 10-325 MG tablet Take 1 tablet by mouth every 4 (four) hours as needed for pain.    [provider]  potassium chloride SA (KLOR-CON M) 20 MEQ tablet Take 20 mEq by mouth daily. 01/16/22   [provider]  risperiDONE (RISPERDAL) 2 MG tablet Take half tablet in the morning and one tablet in the evening. 08/06/22   Arrien, Jimmy Picket, MD  benztropine (COGENTIN) 1 MG tablet Take 1 tablet (1 mg total) by mouth 2 (two) times daily. 12/23/18 06/04/20  Johnn Hai, MD  insulin aspart (NOVOLOG) 100 UNIT/ML injection Inject 0-9 Units into the skin 3 (three) times daily with meals. Patient not  taking: Reported on 06/03/2020 12/16/18 06/04/20  Antonieta Pert, MD      Allergies    Tramadol, Wellbutrin [bupropion], and Nicotine    Review of Systems   Review of Systems  Respiratory:  Positive for shortness of breath.   Musculoskeletal:  Positive for back pain.    Physical Exam Updated Vital Signs BP (!) 101/90 (BP Location: Left Arm)   Pulse 84   Temp 97.8 F (36.6 C) (Oral)   Resp 17   SpO2 95%  Physical Exam Vitals and nursing note reviewed.   Constitutional:      General: She is not in acute distress.    Appearance: Normal appearance. She is ill-appearing.     Comments: Chronically ill-appearing but nontoxic, nonseptic appearing  HENT:     Head: Normocephalic and atraumatic.  Eyes:     General:        Right eye: No discharge.        Left eye: No discharge.  Cardiovascular:     Rate and Rhythm: Normal rate and regular rhythm.     Heart sounds: No murmur heard.    No friction rub. No gallop.  Pulmonary:     Effort: Pulmonary effort is normal.     Breath sounds: Normal breath sounds.  Abdominal:     General: Bowel sounds are normal.     Palpations: Abdomen is soft.  Skin:    General: Skin is warm and dry.     Capillary Refill: Capillary refill takes less than 2 seconds.  Neurological:     Mental Status: She is alert and oriented to person, place, and time.  Psychiatric:        Mood and Affect: Mood normal.        Behavior: Behavior normal.     ED Results / Procedures / Treatments   Labs (all labs ordered are listed, but only abnormal results are displayed) Labs Reviewed  RESP PANEL BY RT-PCR (RSV, FLU A&B, COVID)  RVPGX2  LIPASE, BLOOD  COMPREHENSIVE METABOLIC PANEL  CBC  URINALYSIS, ROUTINE W REFLEX MICROSCOPIC  I-STAT BETA HCG BLOOD, ED ([facility], [facility], [facility] ONLY)    EKG None  Radiology DG Chest 2 View  Result Date: 08/14/2022 CLINICAL DATA:  Shortness of breath, cough EXAM: CHEST - 2 VIEW COMPARISON:  08/03/2022 FINDINGS: The heart size and mediastinal contours are within normal limits. Both lungs are clear. The visualized skeletal structures are unremarkable. IMPRESSION: No active cardiopulmonary disease. Electronically Signed   By: Rolm Baptise M.D.   On: 08/14/2022 03:05    Procedures Procedures  {Document cardiac monitor, telemetry assessment procedure when appropriate:1}  Medications Ordered in ED Medications  ondansetron (ZOFRAN-ODT) disintegrating tablet 4 mg (has no administration in time range)     ED Course/ Medical Decision Making/ A&P Clinical Course as of 08/14/22 2340  Wed Aug 14, 2022  2335 Shortness of breath, extensive evaluations for similar.  Seen twice earlier today eloped from department and then returned. No acute distress, ambulatory sats on room air and dispo likely stable for discharge treated with Lasix in emergency room.  Importance of following up with primary care/cardiology reinforced. [CC]    Clinical Course User Index [CC] Tretha Sciara, MD   {   Click here for ABCD2, HEART and other calculatorsREFRESH Note before signing :1}                          Medical Decision Making Amount and/or Complexity of  Data Reviewed Labs: ordered. Radiology: ordered.  Risk Prescription drug management.   ***  {Document critical care time when appropriate:1} {Document review of labs and clinical decision tools ie heart score, Chads2Vasc2 etc:1}  {Document your independent review of radiology images, and any outside records:1} {Document your discussion with family members, caretakers, and with consultants:1} {Document social determinants of health affecting pt's care:1} {Document your decision making why or why not admission, treatments were needed:1} Final Clinical Impression(s) / ED Diagnoses Final diagnoses:  None    Rx / DC Orders ED Discharge Orders     None

## 2022-08-14 NOTE — ED Notes (Signed)
RN went to locate pt. Pt was not in bed or bathroom. Lobby staff notified RN pt was in front wondering around. RN requested staff to have pt return to bed.

## 2022-08-14 NOTE — ED Provider Notes (Signed)
[facility] Provider Note   CSN: [id number] Arrival date & time: 08/14/22  0155     History  Chief Complaint  Patient presents with   Hyperglycemia    [name] [name] is a 52 y.o. female.  The history is provided by the patient and medical records.  Hyperglycemia [name] [name] is a 52 y.o. female who presents to the Emergency Department complaining of hyperglycemia.  She presents to the emergency department for evaluation of elevated blood sugars.  She is out of her insulin.  She also states that she has more fluid retention for 4 days.  She had vomiting and diarrhea 3 days ago.  Last episode of vomiting or diarrhea was yesterday.  She also reports orthopnea.  She follows up with her PCP in [place] on Sunday at 4 PM.  She does not have a cardiologist.  She has a history of diabetes, CKD, CHF with a EF of 20%.     Home Medications Prior to Admission medications   Medication Sig Start Date End Date Taking? Authorizing Provider  acetaminophen (TYLENOL) 325 MG tablet Take 2 tablets (650 mg total) by mouth every 4 (four) hours as needed for headache or mild pain. 08/06/22   Arrien, Jimmy Picket, MD  albuterol (PROVENTIL) (2.5 MG/3ML) 0.083% nebulizer solution Take 3 mLs (2.5 mg total) by nebulization every 6 (six) hours as needed for shortness of breath. 08/06/22   Arrien, Jimmy Picket, MD  ASPIRIN LOW DOSE 81 MG tablet Take 81 mg by mouth daily. 03/15/22   [provider]  atorvastatin (LIPITOR) 20 MG tablet Take 20 mg by mouth daily. 03/15/22 08/04/22  [provider]  blood glucose meter kit and supplies Dispense based on patient and insurance preference. Use up to four times daily as directed. (FOR ICD-10 E10.9, E11.9). 01/06/18   Eugenie Filler, MD  bumetanide (BUMEX) 2 MG tablet Take 1 tablet (2 mg total) by mouth 2 (two) times daily. 08/06/22 09/05/22  Arrien, Jimmy Picket, MD  fluticasone-salmeterol  (ADVAIR DISKUS) 250-50 MCG/ACT AEPB Inhale 1 puff into the lungs in the morning and at bedtime. 08/06/22   Arrien, Jimmy Picket, MD  guaiFENesin (ROBITUSSIN) 100 MG/5ML liquid Take 5 mLs by mouth every 4 (four) hours as needed for cough or to loosen phlegm. 07/07/22   Jeanell Sparrow, DO  insulin glargine (LANTUS SOLOSTAR) 100 UNIT/ML Solostar Pen Inject 20 Units into the skin in the morning. Do not take if blood sugar is <100. 08/14/22   Quintella Reichert, MD  isosorbide mononitrate (IMDUR) 60 MG 24 hr tablet Take 1 tablet (60 mg total) by mouth daily. 08/06/22 09/05/22  Arrien, Jimmy Picket, MD  metoprolol succinate (TOPROL-XL) 25 MG 24 hr tablet Take 1 tablet (25 mg total) by mouth daily. 08/06/22 09/05/22  Arrien, Jimmy Picket, MD  oxyCODONE-acetaminophen (PERCOCET) 10-325 MG tablet Take 1 tablet by mouth every 4 (four) hours as needed for pain.    [provider]  potassium chloride SA (KLOR-CON M) 20 MEQ tablet Take 20 mEq by mouth daily. 01/16/22   [provider]  risperiDONE (RISPERDAL) 2 MG tablet Take half tablet in the morning and one tablet in the evening. 08/06/22   Arrien, Jimmy Picket, MD  benztropine (COGENTIN) 1 MG tablet Take 1 tablet (1 mg total) by mouth 2 (two) times daily. 12/23/18 06/04/20  Johnn Hai, MD  insulin aspart (NOVOLOG) 100 UNIT/ML injection Inject 0-9 Units into the skin 3 (three) times  daily with meals. Patient not taking: Reported on 06/03/2020 12/16/18 06/04/20  Antonieta Pert, MD      Allergies    Tramadol, Wellbutrin [bupropion], and Nicotine    Review of Systems   Review of Systems  All other systems reviewed and are negative.   Physical Exam Updated Vital Signs BP 125/75 (BP Location: Right Arm)   Pulse 86   Temp 98.6 F (37 C)   Resp 18   Ht '5\' 5"'$  (1.651 m)   Wt 84.3 kg   SpO2 98%   BMI 30.92 kg/m  Physical Exam Vitals and nursing note reviewed.  Constitutional:      Appearance: She is well-developed.  HENT:     Head:  Normocephalic and atraumatic.  Cardiovascular:     Rate and Rhythm: Normal rate and regular rhythm.     Heart sounds: No murmur heard. Pulmonary:     Effort: Pulmonary effort is normal. No respiratory distress.     Breath sounds: Normal breath sounds.  Abdominal:     Palpations: Abdomen is soft.     Tenderness: There is no abdominal tenderness. There is no guarding or rebound.  Musculoskeletal:        General: No tenderness.     Comments: Trace pitting edema to bilateral lower extremities  Skin:    General: Skin is warm and dry.  Neurological:     Mental Status: She is alert and oriented to person, place, and time.  Psychiatric:        Behavior: Behavior normal.     ED Results / Procedures / Treatments   Labs (all labs ordered are listed, but only abnormal results are displayed) Labs Reviewed  COMPREHENSIVE METABOLIC PANEL - Abnormal; Notable for the following components:      Result Value   Glucose, Bld 427 (*)    BUN 26 (*)    Creatinine, Ser 1.26 (*)    Calcium 8.5 (*)    Total Protein 6.2 (*)    Alkaline Phosphatase 129 (*)    GFR, Estimated 51 (*)    All other components within normal limits  CBC WITH DIFFERENTIAL/PLATELET - Abnormal; Notable for the following components:   Hemoglobin 11.4 (*)    All other components within normal limits  BRAIN NATRIURETIC PEPTIDE - Abnormal; Notable for the following components:   B Natriuretic Peptide 1,503.0 (*)    All other components within normal limits  CBG MONITORING, ED - Abnormal; Notable for the following components:   Glucose-Capillary 487 (*)    All other components within normal limits  I-STAT CHEM 8, ED - Abnormal; Notable for the following components:   BUN 34 (*)    Creatinine, Ser 1.20 (*)    Glucose, Bld 444 (*)    Calcium, Ion 1.07 (*)    Hemoglobin 11.9 (*)    HCT 35.0 (*)    All other components within normal limits  CBG MONITORING, ED - Abnormal; Notable for the following components:    Glucose-Capillary 347 (*)    All other components within normal limits  CBG MONITORING, ED - Abnormal; Notable for the following components:   Glucose-Capillary 132 (*)    All other components within normal limits  ETHANOL    EKG EKG Interpretation  Date/Time:  Wednesday August 14 2022 03:37:32 EST Ventricular Rate:  85 PR Interval:  177 QRS Duration: 109 QT Interval:  468 QTC Calculation: 557 R Axis:   64 Text Interpretation: Sinus rhythm Right atrial enlargement Anteroseptal infarct, age indeterminate  Prolonged QT interval Confirmed by Quintella Reichert [id number]) on 08/14/2022 3:40:25 AM  Radiology DG Chest 2 View  Result Date: 08/14/2022 CLINICAL DATA:  Shortness of breath, cough EXAM: CHEST - 2 VIEW COMPARISON:  08/03/2022 FINDINGS: The heart size and mediastinal contours are within normal limits. Both lungs are clear. The visualized skeletal structures are unremarkable. IMPRESSION: No active cardiopulmonary disease. Electronically Signed   By: Rolm Baptise M.D.   On: 08/14/2022 03:05    Procedures Procedures    Medications Ordered in ED Medications  insulin aspart (novoLOG) injection 10 Units (10 Units Subcutaneous Given 08/14/22 0416)  furosemide (LASIX) injection 40 mg (40 mg Intravenous Given 08/14/22 [id number])    ED Course/ Medical Decision Making/ A&P                             Medical Decision Making Amount and/or Complexity of Data Reviewed Labs: ordered. Radiology: ordered.  Risk Prescription drug management.   Patient with history of diabetes, CKD, CHF here for evaluation of hyperglycemia.  She is out of her insulin.  She did recently have illness with vomiting and diarrhea but overall these have gone away over the last 24 hours.  He also reports some orthopnea and feels more swollen.  On examination she does have some lower extremity edema but is in no respiratory distress with clear lungs bilaterally.  BNP is increased compared to recent hospitalization but her chest  x-ray is clear at this point and she has no tachypnea, no hypoxia and she is able to ambulate without any respiratory difficulty.  She does not appear to be in acute decompensated congestive heart failure.  Will treat with a one-time dose of furosemide in the emergency department.  She is hyperglycemic but no evidence of DKA.  Will represcribe her insulin.  Plan to discharge with close PCP follow-up scheduled for later this week and return precautions.        Final Clinical Impression(s) / ED Diagnoses Final diagnoses:  Hyperglycemia    Rx / DC Orders ED Discharge Orders          Ordered    insulin glargine (LANTUS SOLOSTAR) 100 UNIT/ML Solostar Pen  Every morning        08/14/22 [id number]              Quintella Reichert, MD 08/14/22 [phone]

## 2022-08-14 NOTE — ED Notes (Signed)
Pt state she is having 10/10 pain in her chest and hip. Pt states she cannot take Ibuprofen d/t kidney problems. RN notified EDP

## 2022-08-14 NOTE — ED Triage Notes (Signed)
Pt states she has an underlining heart condition and DM2. Pt states she has had chest tightness for four days and tingling in her hands that may be d/t DM2.

## 2022-08-14 NOTE — ED Notes (Signed)
Pt states she is leaving because she cannot go smoke.

## 2022-08-14 NOTE — ED Notes (Signed)
Pt states her ride Danae Chen may be outside, so she will have to leave. Pt requesting pain and nausea medication. Pt states she cannot take Phenergan d/t jitters. EDP notified

## 2022-08-14 NOTE — ED Notes (Addendum)
Pt ambulated on pulse ox, SPO2 95 while ambulating.

## 2022-08-14 NOTE — ED Provider Notes (Signed)
[facility] Provider Note   CSN: [id number] Arrival date & time: 08/14/22  [id number]     History  Chief Complaint  Patient presents with   Chest Pain    [name] [name] is a 52 y.o. female.  Patient is a 52 year old for female who presents with concerns about her heart.  Per chart review, she has a history of chronic kidney disease, CHF, COPD, bipolar disorder and schizoaffective disorder.  She had a recent admission from February 20 4 February 27th for CHF exacerbation.  She had an echo done which showed below results.   1. Left ventricular ejection fraction, by estimation, is <20%. The left  ventricle has severely decreased function. The left ventricle demonstrates  global hypokinesis. The left ventricular internal cavity size was  moderately to severely dilated. Left  ventricular diastolic parameters are consistent with Grade III diastolic  dysfunction (restrictive). No LV thrombus visualized.   2. Right ventricular systolic function is mildly reduced. The right  ventricular size is normal. There is normal pulmonary artery systolic  pressure. The estimated right ventricular systolic pressure is 99991111 mmHg.   3. Left atrial size was moderately dilated.   4. The mitral valve is abnormal. Moderate to severe central mitral valve  regurgitation, PISA ERO 0.35 cm^2 (visually MR looks severe). Suspect  functional MR with dilated LV. No evidence of mitral stenosis.   5. The aortic valve is tricuspid. Aortic valve regurgitation is not  visualized. No aortic stenosis is present.   6. The inferior vena cava is dilated in size with <50% respiratory  variability, suggesting right atrial pressure of 15 mmHg.   She was seen during the night last night for hyperglycemia.  She was noted to have an increase in her BNP at 1500.  However she was not really reporting any shortness of breath.  Her chest x-ray did not show pulmonary edema.  She says that she does  have some orthopnea and feels like her abdomen is more distended which is where she typically holds her fluid.  She says she has shortness of breath at times.  No cough or cold symptoms.  No fevers.  She was given a dose of 40 mg of Lasix during the visit last night.  She comes in today because she is more concerned about her heart.  She does not currently have a cardiologist.  She does report some intermittent tightness in her chest.       Home Medications Prior to Admission medications   Medication Sig Start Date End Date Taking? Authorizing Provider  acetaminophen (TYLENOL) 325 MG tablet Take 2 tablets (650 mg total) by mouth every 4 (four) hours as needed for headache or mild pain. 08/06/22   Arrien, Jimmy Picket, MD  albuterol (PROVENTIL) (2.5 MG/3ML) 0.083% nebulizer solution Take 3 mLs (2.5 mg total) by nebulization every 6 (six) hours as needed for shortness of breath. 08/06/22   Arrien, Jimmy Picket, MD  ASPIRIN LOW DOSE 81 MG tablet Take 81 mg by mouth daily. 03/15/22   [provider]  atorvastatin (LIPITOR) 20 MG tablet Take 20 mg by mouth daily. 03/15/22 08/04/22  [provider]  blood glucose meter kit and supplies Dispense based on patient and insurance preference. Use up to four times daily as directed. (FOR ICD-10 E10.9, E11.9). 01/06/18   Eugenie Filler, MD  bumetanide (BUMEX) 2 MG tablet Take 1 tablet (2 mg total) by mouth 2 (two) times daily. 08/06/22 09/05/22  Arrien, Jimmy Picket, MD  fluticasone-salmeterol (ADVAIR DISKUS) 250-50 MCG/ACT AEPB Inhale 1 puff into the lungs in the morning and at bedtime. 08/06/22   Arrien, Jimmy Picket, MD  guaiFENesin (ROBITUSSIN) 100 MG/5ML liquid Take 5 mLs by mouth every 4 (four) hours as needed for cough or to loosen phlegm. 07/07/22   Jeanell Sparrow, DO  insulin glargine (LANTUS SOLOSTAR) 100 UNIT/ML Solostar Pen Inject 20 Units into the skin in the morning. Do not take if blood sugar is <100. 08/14/22   Quintella Reichert, MD  isosorbide mononitrate (IMDUR) 60 MG 24 hr tablet Take 1 tablet (60 mg total) by mouth daily. 08/06/22 09/05/22  Arrien, Jimmy Picket, MD  metoprolol succinate (TOPROL-XL) 25 MG 24 hr tablet Take 1 tablet (25 mg total) by mouth daily. 08/06/22 09/05/22  Arrien, Jimmy Picket, MD  oxyCODONE-acetaminophen (PERCOCET) 10-325 MG tablet Take 1 tablet by mouth every 4 (four) hours as needed for pain.    [provider]  potassium chloride SA (KLOR-CON M) 20 MEQ tablet Take 20 mEq by mouth daily. 01/16/22   [provider]  risperiDONE (RISPERDAL) 2 MG tablet Take half tablet in the morning and one tablet in the evening. 08/06/22   Arrien, Jimmy Picket, MD  benztropine (COGENTIN) 1 MG tablet Take 1 tablet (1 mg total) by mouth 2 (two) times daily. 12/23/18 06/04/20  Johnn Hai, MD  insulin aspart (NOVOLOG) 100 UNIT/ML injection Inject 0-9 Units into the skin 3 (three) times daily with meals. Patient not taking: Reported on 06/03/2020 12/16/18 06/04/20  Antonieta Pert, MD      Allergies    Tramadol, Wellbutrin [bupropion], and Nicotine    Review of Systems   Review of Systems  Constitutional:  Positive for fatigue. Negative for chills, diaphoresis and fever.  HENT:  Negative for congestion, rhinorrhea and sneezing.   Eyes: Negative.   Respiratory:  Positive for cough, chest tightness and shortness of breath.   Cardiovascular:  Negative for chest pain and leg swelling.  Gastrointestinal:  Negative for abdominal pain, blood in stool, diarrhea, nausea and vomiting.  Genitourinary:  Negative for difficulty urinating, flank pain, frequency and hematuria.  Musculoskeletal:  Negative for arthralgias and back pain.  Skin:  Negative for rash.  Neurological:  Negative for dizziness, speech difficulty, weakness, numbness and headaches.    Physical Exam Updated Vital Signs BP 104/72 (BP Location: Left Arm)   Pulse 88   Temp 99.1 F (37.3 C) (Oral)   Resp 17   Ht '5\' 7"'$   (1.702 m)   Wt 84.3 kg   SpO2 96%   BMI 29.10 kg/m  Physical Exam Constitutional:      Appearance: She is well-developed.  HENT:     Head: Normocephalic and atraumatic.  Eyes:     Pupils: Pupils are equal, round, and reactive to light.  Cardiovascular:     Rate and Rhythm: Normal rate and regular rhythm.     Heart sounds: Normal heart sounds.  Pulmonary:     Effort: Pulmonary effort is normal. No respiratory distress.     Breath sounds: Normal breath sounds. No wheezing or rales.  Chest:     Chest wall: No tenderness.  Abdominal:     General: Bowel sounds are normal.     Palpations: Abdomen is soft.     Tenderness: There is no abdominal tenderness. There is no guarding or rebound.  Musculoskeletal:        General: Normal range of motion.     Cervical  back: Normal range of motion and neck supple.     Comments: Trace edema to lower extremities bilaterally, no calf tenderness  Lymphadenopathy:     Cervical: No cervical adenopathy.  Skin:    General: Skin is warm and dry.     Findings: No rash.  Neurological:     Mental Status: She is alert and oriented to person, place, and time.     ED Results / Procedures / Treatments   Labs (all labs ordered are listed, but only abnormal results are displayed) Labs Reviewed  TROPONIN I (HIGH SENSITIVITY) - Abnormal; Notable for the following components:      Result Value   Troponin I (High Sensitivity) 49 (*)    All other components within normal limits  TROPONIN I (HIGH SENSITIVITY) - Abnormal; Notable for the following components:   Troponin I (High Sensitivity) 50 (*)    All other components within normal limits    EKG EKG Interpretation  Date/Time:  Wednesday August 14 2022 10:46:19 EST Ventricular Rate:  86 PR Interval:  170 QRS Duration: 110 QT Interval:  464 QTC Calculation: 555 R Axis:   44 Text Interpretation:  Critical Test Result: Long QTc Normal sinus rhythm Right atrial enlargement Minimal voltage criteria for  LVH, may be normal variant ( Cornell product ) Prolonged QT Abnormal ECG When compared with ECG of 14-Aug-2022 03:37, PREVIOUS ECG IS PRESENT since last tracing no significant change Confirmed by Malvin Johns [phone]) on 08/14/2022 10:48:25 AM  Radiology DG Chest 2 View  Result Date: 08/14/2022 CLINICAL DATA:  Shortness of breath, cough EXAM: CHEST - 2 VIEW COMPARISON:  08/03/2022 FINDINGS: The heart size and mediastinal contours are within normal limits. Both lungs are clear. The visualized skeletal structures are unremarkable. IMPRESSION: No active cardiopulmonary disease. Electronically Signed   By: Rolm Baptise M.D.   On: 08/14/2022 03:05    Procedures Procedures    Medications Ordered in ED Medications  oxyCODONE-acetaminophen (PERCOCET/ROXICET) 5-325 MG per tablet 2 tablet (2 tablets Oral Given 08/14/22 1039)    ED Course/ Medical Decision Making/ A&P                             Medical Decision Making Amount and/or Complexity of Data Reviewed External Data Reviewed: labs, radiology and notes. Labs: ordered. Decision-making details documented in ED Course. Radiology: independent interpretation performed. Decision-making details documented in ED Course. ECG/medicine tests: ordered and independent interpretation performed. Decision-making details documented in ED Course.   Patient had been seen here during the night with hyperglycemia.  She was noted to have an elevation in her BNP at that time but was not reporting shortness of breath.  No hypoxia on ambulation at that time.  She presents back because she is concerned about her heart.  She does not report any specific shortness of breath but she says she feels congested.  Her BNP was elevated at 1500.  I reviewed her chest x-ray from her visit during the night and there is no signs of pulmonary edema.  She had been given a dose of Lasix at that time.  Her troponins were checked and are mildly elevated although on chart review were  elevated during her last admission as well.  They appear to be fairly flat.  It was 49 and 50.  Her EKG does not show any ischemic changes.  However patient left AMA without my knowledge prior to going over her results.  She did  know about the first elevated trop and why we were waiting for the second.  She had reported to me that she has an appointment to follow-up with her doctor on Sunday at a [facility] in [place].  Final Clinical Impression(s) / ED Diagnoses Final diagnoses:  Nonspecific chest pain  Elevated troponin    Rx / DC Orders ED Discharge Orders     None         Malvin Johns, MD 08/14/22 1404

## 2022-08-15 NOTE — ED Notes (Signed)
Pt discharged. Attempting to get a hold of her family for a ride home. So far daughter and son have not answered.

## 2022-08-15 NOTE — ED Provider Notes (Incomplete)
[facility] Provider Note   CSN: [id number] Arrival date & time: 08/14/22  1951     History {Add pertinent medical, surgical, social history, OB history to HPI:1} Chief Complaint  Patient presents with  . Shortness of Breath  . Back Pain    ZANAYA KAUFMANN is a 52 y.o. female with past medical history significant for diabetes, obesity, tobacco use disorder, hypertension, hyponatremia, bipolar, homelessness, CHF, COPD who presents with concern for upper back pain, shortness of breath, vomiting, and concern about her diabetes.  CBG on arrival 387.  Patient reports that she is having some left knee pain from a fall several months ago, reports that she has not had any evaluation of the affected extremity.  Notably patient was seen twice already today, at that time she was found to have elevated heart failure enzymes, otherwise without any acute admittable diagnosis.   Shortness of Breath Back Pain      Home Medications Prior to Admission medications   Medication Sig Start Date End Date Taking? Authorizing Provider  acetaminophen (TYLENOL) 325 MG tablet Take 2 tablets (650 mg total) by mouth every 4 (four) hours as needed for headache or mild pain. 08/06/22   Arrien, Jimmy Picket, MD  albuterol (PROVENTIL) (2.5 MG/3ML) 0.083% nebulizer solution Take 3 mLs (2.5 mg total) by nebulization every 6 (six) hours as needed for shortness of breath. 08/06/22   Arrien, Jimmy Picket, MD  ASPIRIN LOW DOSE 81 MG tablet Take 81 mg by mouth daily. 03/15/22   [provider]  atorvastatin (LIPITOR) 20 MG tablet Take 20 mg by mouth daily. 03/15/22 08/04/22  [provider]  blood glucose meter kit and supplies Dispense based on patient and insurance preference. Use up to four times daily as directed. (FOR ICD-10 E10.9, E11.9). 01/06/18   Eugenie Filler, MD  bumetanide (BUMEX) 2 MG tablet Take 1 tablet (2 mg total) by mouth 2 (two) times  daily. 08/06/22 09/05/22  Arrien, Jimmy Picket, MD  fluticasone-salmeterol (ADVAIR DISKUS) 250-50 MCG/ACT AEPB Inhale 1 puff into the lungs in the morning and at bedtime. 08/06/22   Arrien, Jimmy Picket, MD  guaiFENesin (ROBITUSSIN) 100 MG/5ML liquid Take 5 mLs by mouth every 4 (four) hours as needed for cough or to loosen phlegm. 07/07/22   Jeanell Sparrow, DO  insulin glargine (LANTUS SOLOSTAR) 100 UNIT/ML Solostar Pen Inject 20 Units into the skin in the morning. Do not take if blood sugar is <100. 08/14/22   Quintella Reichert, MD  isosorbide mononitrate (IMDUR) 60 MG 24 hr tablet Take 1 tablet (60 mg total) by mouth daily. 08/06/22 09/05/22  Arrien, Jimmy Picket, MD  metoprolol succinate (TOPROL-XL) 25 MG 24 hr tablet Take 1 tablet (25 mg total) by mouth daily. 08/06/22 09/05/22  Arrien, Jimmy Picket, MD  oxyCODONE-acetaminophen (PERCOCET) 10-325 MG tablet Take 1 tablet by mouth every 4 (four) hours as needed for pain.    [provider]  potassium chloride SA (KLOR-CON M) 20 MEQ tablet Take 20 mEq by mouth daily. 01/16/22   [provider]  risperiDONE (RISPERDAL) 2 MG tablet Take half tablet in the morning and one tablet in the evening. 08/06/22   Arrien, Jimmy Picket, MD  benztropine (COGENTIN) 1 MG tablet Take 1 tablet (1 mg total) by mouth 2 (two) times daily. 12/23/18 06/04/20  Johnn Hai, MD  insulin aspart (NOVOLOG) 100 UNIT/ML injection Inject 0-9 Units into the skin 3 (three) times daily with meals. Patient not  taking: Reported on 06/03/2020 12/16/18 06/04/20  Antonieta Pert, MD      Allergies    Tramadol, Wellbutrin [bupropion], and Nicotine    Review of Systems   Review of Systems  Respiratory:  Positive for shortness of breath.   Musculoskeletal:  Positive for back pain.    Physical Exam Updated Vital Signs BP (!) 101/90 (BP Location: Left Arm)   Pulse 84   Temp 97.8 F (36.6 C) (Oral)   Resp 17   SpO2 95%  Physical Exam Vitals and nursing note reviewed.   Constitutional:      General: She is not in acute distress.    Appearance: Normal appearance. She is ill-appearing.     Comments: Chronically ill-appearing but nontoxic, nonseptic appearing  HENT:     Head: Normocephalic and atraumatic.  Eyes:     General:        Right eye: No discharge.        Left eye: No discharge.  Cardiovascular:     Rate and Rhythm: Normal rate and regular rhythm.     Heart sounds: No murmur heard.    No friction rub. No gallop.  Pulmonary:     Effort: Pulmonary effort is normal.     Breath sounds: Normal breath sounds.  Abdominal:     General: Bowel sounds are normal.     Palpations: Abdomen is soft.  Skin:    General: Skin is warm and dry.     Capillary Refill: Capillary refill takes less than 2 seconds.  Neurological:     Mental Status: She is alert and oriented to person, place, and time.  Psychiatric:        Mood and Affect: Mood normal.        Behavior: Behavior normal.     ED Results / Procedures / Treatments   Labs (all labs ordered are listed, but only abnormal results are displayed) Labs Reviewed  RESP PANEL BY RT-PCR (RSV, FLU A&B, COVID)  [id number]  LIPASE, BLOOD  COMPREHENSIVE METABOLIC PANEL  CBC  URINALYSIS, ROUTINE W REFLEX MICROSCOPIC  I-STAT BETA HCG BLOOD, ED ([facility], [facility], [facility] ONLY)    EKG None  Radiology DG Chest 2 View  Result Date: 08/14/2022 CLINICAL DATA:  Shortness of breath, cough EXAM: CHEST - 2 VIEW COMPARISON:  08/03/2022 FINDINGS: The heart size and mediastinal contours are within normal limits. Both lungs are clear. The visualized skeletal structures are unremarkable. IMPRESSION: No active cardiopulmonary disease. Electronically Signed   By: Rolm Baptise M.D.   On: 08/14/2022 03:05    Procedures Procedures  {Document cardiac monitor, telemetry assessment procedure when appropriate:1}  Medications Ordered in ED Medications  ondansetron (ZOFRAN-ODT) disintegrating tablet 4 mg (has no administration in time range)     ED Course/ Medical Decision Making/ A&P Clinical Course as of 08/14/22 2340  Wed Aug 14, 2022  2335 Shortness of breath, extensive evaluations for similar.  Seen twice earlier today eloped from department and then returned. No acute distress, ambulatory sats on room air and dispo likely stable for discharge treated with Lasix in emergency room.  Importance of following up with primary care/cardiology reinforced. [CC]    Clinical Course User Index [CC] Tretha Sciara, MD   {   Click here for ABCD2, HEART and other calculatorsREFRESH Note before signing :1}                          Medical Decision Making Amount and/or Complexity of  Data Reviewed Labs: ordered. Radiology: ordered.  Risk Prescription drug management.   This patient is a 52 y.o. female who presents to the ED for concern of shortness of breath, upper back pain, left knee pain, and concern for hyperglycemia, notably this is her third visit in 24 hours for similar, this involves an extensive number of treatment options, and is a complaint that carries with it a high risk of complications and morbidity. The emergent differential diagnosis prior to evaluation includes, but is not limited to,  *** . This is not an exhaustive differential.   Past Medical History / Co-morbidities / Social History: ***  Additional history: Chart reviewed. Pertinent results include: ***  Physical Exam: Physical exam performed. The pertinent findings include: ***  Lab Tests: I ordered, and personally interpreted labs.  The pertinent results include:  ***   Imaging Studies: I ordered imaging studies including ***. I independently visualized and interpreted imaging which showed ***. I agree with the radiologist interpretation.   Cardiac Monitoring:  The patient was maintained on a cardiac monitor.  My attending physician Dr. Marland Kitchen viewed and interpreted the cardiac monitored which showed an underlying rhythm of: ***. I agree with this  interpretation.   Medications: I ordered medication including ***  for ***. Reevaluation of the patient after these medicines showed that the patient {resolved/improved/worsened:23923::"improved"}. I have reviewed the patients home medicines and have made adjustments as needed.  Consultations Obtained: I requested consultation with the ***,  and discussed lab and imaging findings as well as pertinent plan - they recommend: ***   Disposition: After consideration of the diagnostic results and the patients response to treatment, I feel that *** .   ***emergency department workup does not suggest an emergent condition requiring admission or immediate intervention beyond what has been performed at this time. The plan is: ***. The patient is safe for discharge and has been instructed to return immediately for worsening symptoms, change in symptoms or any other concerns.  I discussed this case with my attending physician Dr. Marland Kitchen who cosigned this note including patient's presenting symptoms, physical exam, and planned diagnostics and interventions. Attending physician stated agreement with plan or made changes to plan which were implemented.    Final Clinical Impression(s) / ED Diagnoses Final diagnoses:  None    Rx / DC Orders ED Discharge Orders     None

## 2022-08-19 ENCOUNTER — Encounter (HOSPITAL_COMMUNITY): Payer: Medicaid Other

## 2022-08-19 ENCOUNTER — Other Ambulatory Visit: Payer: Self-pay

## 2022-08-19 ENCOUNTER — Emergency Department (HOSPITAL_COMMUNITY): Payer: Medicaid Other

## 2022-08-19 ENCOUNTER — Emergency Department (HOSPITAL_COMMUNITY)
Admission: EM | Admit: 2022-08-19 | Discharge: 2022-08-19 | Disposition: A | Discharge status: Home / Self Care | Payer: Medicaid Other | Attending: Emergency Medicine | Admitting: Emergency Medicine

## 2022-08-19 DIAGNOSIS — J441 Chronic obstructive pulmonary disease with (acute) exacerbation: Secondary | ICD-10-CM | POA: Insufficient documentation

## 2022-08-19 DIAGNOSIS — Z7982 Long term (current) use of aspirin: Secondary | ICD-10-CM | POA: Diagnosis not present

## 2022-08-19 DIAGNOSIS — I509 Heart failure, unspecified: Secondary | ICD-10-CM | POA: Insufficient documentation

## 2022-08-19 DIAGNOSIS — R112 Nausea with vomiting, unspecified: Secondary | ICD-10-CM | POA: Insufficient documentation

## 2022-08-19 DIAGNOSIS — E119 Type 2 diabetes mellitus without complications: Secondary | ICD-10-CM | POA: Insufficient documentation

## 2022-08-19 DIAGNOSIS — R0602 Shortness of breath: Secondary | ICD-10-CM | POA: Diagnosis present

## 2022-08-19 DIAGNOSIS — J45909 Unspecified asthma, uncomplicated: Secondary | ICD-10-CM | POA: Diagnosis not present

## 2022-08-19 LAB — CBG MONITORING, ED: Glucose-Capillary: 295 mg/dL — ABNORMAL HIGH (ref 70–99)

## 2022-08-19 MED ORDER — PROMETHAZINE HCL 25 MG PO TABS
25.0000 mg | ORAL_TABLET | Freq: Once | ORAL | Status: AC
  Administered 2022-08-19: 25 mg via ORAL

## 2022-08-19 MED ORDER — PREDNISONE 20 MG PO TABS
60.0000 mg | ORAL_TABLET | Freq: Once | ORAL | Status: AC
  Administered 2022-08-19: 60 mg via ORAL
  Filled 2022-08-19: qty 3

## 2022-08-19 MED ORDER — ONDANSETRON 4 MG PO TBDP
8.0000 mg | ORAL_TABLET | Freq: Once | ORAL | Status: AC
  Administered 2022-08-19: 8 mg via ORAL
  Filled 2022-08-19: qty 2

## 2022-08-19 MED ORDER — ALBUTEROL SULFATE (2.5 MG/3ML) 0.083% IN NEBU
5.0000 mg | INHALATION_SOLUTION | Freq: Once | RESPIRATORY_TRACT | Status: AC
  Administered 2022-08-19: 5 mg via RESPIRATORY_TRACT
  Filled 2022-08-19: qty 6

## 2022-08-19 MED ORDER — ACETAMINOPHEN 325 MG PO TABS
650.0000 mg | ORAL_TABLET | Freq: Once | ORAL | Status: AC
  Administered 2022-08-19: 650 mg via ORAL
  Filled 2022-08-19: qty 2

## 2022-08-19 MED ORDER — IPRATROPIUM BROMIDE 0.02 % IN SOLN
0.5000 mg | Freq: Once | RESPIRATORY_TRACT | Status: AC
  Administered 2022-08-19: 0.5 mg via RESPIRATORY_TRACT
  Filled 2022-08-19: qty 2.5

## 2022-08-19 MED ORDER — PROMETHAZINE HCL 25 MG PO TABS
50.0000 mg | ORAL_TABLET | Freq: Once | ORAL | Status: DC
  Filled 2022-08-19: qty 2

## 2022-08-19 NOTE — ED Triage Notes (Signed)
[facility] with c/o sob 3 days getting worse tonight. Wheezing, hx chf, asthma, bipolar, DM2,

## 2022-08-19 NOTE — ED Provider Notes (Signed)
[facility] Provider Note   CSN: [id number] Arrival date & time: 08/19/22  0443     History  Chief Complaint  Patient presents with   Shortness of Breath    [name] [name] is a 52 y.o. female.  The history is provided by the patient.  Shortness of Breath Severity:  Moderate Onset quality:  Sudden Timing:  Constant Progression:  Worsening Chronicity:  Recurrent Relieved by:  Nothing Associated symptoms: cough and vomiting   Associated symptoms: no chest pain and no fever   Patient with history of bipolar, COPD, CHF presents with shortness of breath. Patient reports she woke up and felt out of breath.  No new chest pain.  She does report recent cough.  She has also had recent nausea/vomiting. She reports she is compliant with her medications.  She is still smoking.  Patient was brought from a local shelter via EMS.  She reports nebulizer treatment she received did not improve her symptoms    Past Medical History:  Diagnosis Date   Anxiety disorder    Asthma    Bipolar affective disorder ([place])    Blood transfusion without reported diagnosis    CHF (congestive heart failure) ([place])    Depression    Diabetes mellitus    Migraine    Schizophrenia ([place])     Home Medications Prior to Admission medications   Medication Sig Start Date End Date Taking? Authorizing Provider  acetaminophen (TYLENOL) 325 MG tablet Take 2 tablets (650 mg total) by mouth every 4 (four) hours as needed for headache or mild pain. 08/06/22   Arrien, Jimmy Picket, MD  albuterol (PROVENTIL) (2.5 MG/3ML) 0.083% nebulizer solution Take 3 mLs (2.5 mg total) by nebulization every 6 (six) hours as needed for shortness of breath. 08/06/22   Arrien, Jimmy Picket, MD  ASPIRIN LOW DOSE 81 MG tablet Take 81 mg by mouth daily. 03/15/22   [provider]  atorvastatin (LIPITOR) 20 MG tablet Take 20 mg by mouth daily. 03/15/22 08/04/22  [provider]   blood glucose meter kit and supplies Dispense based on patient and insurance preference. Use up to four times daily as directed. (FOR ICD-10 E10.9, E11.9). 01/06/18   Eugenie Filler, MD  bumetanide (BUMEX) 2 MG tablet Take 1 tablet (2 mg total) by mouth 2 (two) times daily. 08/06/22 09/05/22  Arrien, Jimmy Picket, MD  fluticasone-salmeterol (ADVAIR DISKUS) 250-50 MCG/ACT AEPB Inhale 1 puff into the lungs in the morning and at bedtime. 08/06/22   Arrien, Jimmy Picket, MD  guaiFENesin (ROBITUSSIN) 100 MG/5ML liquid Take 5 mLs by mouth every 4 (four) hours as needed for cough or to loosen phlegm. 07/07/22   Jeanell Sparrow, DO  insulin glargine (LANTUS SOLOSTAR) 100 UNIT/ML Solostar Pen Inject 20 Units into the skin in the morning. Do not take if blood sugar is <100. 08/14/22   Quintella Reichert, MD  isosorbide mononitrate (IMDUR) 60 MG 24 hr tablet Take 1 tablet (60 mg total) by mouth daily. 08/06/22 09/05/22  Arrien, Jimmy Picket, MD  metoprolol succinate (TOPROL-XL) 25 MG 24 hr tablet Take 1 tablet (25 mg total) by mouth daily. 08/06/22 09/05/22  Arrien, Jimmy Picket, MD  oxyCODONE-acetaminophen (PERCOCET) 10-325 MG tablet Take 1 tablet by mouth every 4 (four) hours as needed for pain.    [provider]  potassium chloride SA (KLOR-CON M) 20 MEQ tablet Take 20 mEq by mouth daily. 01/16/22   [provider]  risperiDONE (RISPERDAL)  2 MG tablet Take half tablet in the morning and one tablet in the evening. 08/06/22   Arrien, Jimmy Picket, MD  benztropine (COGENTIN) 1 MG tablet Take 1 tablet (1 mg total) by mouth 2 (two) times daily. 12/23/18 06/04/20  Johnn Hai, MD  insulin aspart (NOVOLOG) 100 UNIT/ML injection Inject 0-9 Units into the skin 3 (three) times daily with meals. Patient not taking: Reported on 06/03/2020 12/16/18 06/04/20  Antonieta Pert, MD      Allergies    Tramadol, Wellbutrin [bupropion], and Nicotine    Review of Systems   Review of Systems  Constitutional:   Negative for fever.  Respiratory:  Positive for cough and shortness of breath.   Cardiovascular:  Positive for leg swelling. Negative for chest pain.  Gastrointestinal:  Positive for nausea and vomiting.    Physical Exam Updated Vital Signs BP 119/77 (BP Location: Right Arm)   Pulse 87   Temp (!) 97.1 F (36.2 C) (Oral)   Resp 19   Ht 1.676 m ('5\' 6"'$ )   Wt 80.3 kg   SpO2 95%   BMI 28.57 kg/m  Physical Exam CONSTITUTIONAL: Chronically ill-appearing, no acute distress HEAD: Normocephalic/atraumatic EYES: EOMI/PERRL ENMT: Mucous membranes moist NECK: supple no meningeal signs, no JVD CV: S1/S2 noted LUNGS: Coarse wheezing bilaterally ABDOMEN: soft, nontender NEURO: Pt is awake/alert/appropriate, moves all extremitiesx4.  No facial droop.   EXTREMITIES: pulses normal/equal, full ROM, mild edema noted to the bilateral lower extremities SKIN: warm, color normal PSYCH: Flat affect  ED Results / Procedures / Treatments   Labs (all labs ordered are listed, but only abnormal results are displayed) Labs Reviewed - No data to display  EKG EKG Interpretation  Date/Time:  Monday August 19 2022 04:52:50 EDT Ventricular Rate:  90 PR Interval:  190 QRS Duration: 107 QT Interval:  359 QTC Calculation: 440 R Axis:   94 Text Interpretation: Sinus rhythm Consider left atrial enlargement Borderline right axis deviation Anteroseptal infarct, old Confirmed by Ripley Fraise [id number]) on 08/19/2022 4:56:03 AM  Radiology DG Chest Port 1 View  Result Date: 08/19/2022 CLINICAL DATA:  Shortness of breath. EXAM: PORTABLE CHEST 1 VIEW COMPARISON:  PA Lat 08/14/2022 FINDINGS: The heart is moderately enlarged. The mediastinum is normally outlined. There is mild central vascular prominence without findings of acute edema. The lungs are clear of infiltrates. There is no substantial pleural effusion. Mild chronic elevation right hemidiaphragm. Thoracic spondylosis. IMPRESSION: Moderate cardiomegaly with  mild central vascular prominence. No findings of acute edema or airspace consolidation. Electronically Signed   By: Telford Nab M.D.   On: 08/19/2022 05:31    Procedures Procedures    Medications Ordered in ED Medications  albuterol (PROVENTIL) (2.5 MG/3ML) 0.083% nebulizer solution 5 mg (5 mg Nebulization Given 08/19/22 0519)  ipratropium (ATROVENT) nebulizer solution 0.5 mg (0.5 mg Nebulization Given 08/19/22 0519)  predniSONE (DELTASONE) tablet 60 mg (60 mg Oral Given 08/19/22 0518)  acetaminophen (TYLENOL) tablet 650 mg (650 mg Oral Given 08/19/22 0518)  albuterol (PROVENTIL) (2.5 MG/3ML) 0.083% nebulizer solution 5 mg (5 mg Nebulization Given 08/19/22 0609)  ondansetron (ZOFRAN-ODT) disintegrating tablet 8 mg (8 mg Oral Given 08/19/22 0610)    ED Course/ Medical Decision Making/ A&P Clinical Course as of 08/19/22 0657  Mon Aug 19, 2022  0610 Patient with history of COPD, CHF presents with shortness of breath.  Patient's clinical appearance appears more consistent with COPD.  She is beginning to improve with nebulizers and steroids.  Will continue to monitor.  X-ray does  not reveal any signs of acute pulmonary edema [DW]  [phone] Patient resting comfortably, no acute distress.  Her lung sounds are improved.  No significant findings of acute CHF.  Patient reported nausea but no vomiting. [DW]  [phone] At this point I feel patient is safe for outpatient management.  I reviewed her medications, she reports she is out of most of them but does have albuterol.  She reports she would not be able to get her medicines until the first of the month.  She also states she does not want to take prednisone as it will flare of her bipolar. [DW]  [id number] Throughout her ED stay, patient Requesting medicine for her chronic pain. [DW]  [phone]   Patient became angry when I gave her Tylenol.  She reports she can only get her pain medications up in [place].  I discussed the need to find local primary care if she is living  here in [place].  Will place social work consult to assist with placement at the [facility] and wellness. [DW]  [phone] Patient stated since I was not giving her any meds for her chronic pain, she would just be discharged and come back later when I am off shift. [DW]  [phone] Of note, patient has a follow-up appointment later today.  Patient will be reminded to make this appointment [DW]    Clinical Course User Index [DW] Ripley Fraise, MD                             Medical Decision Making Amount and/or Complexity of Data Reviewed Radiology: ordered.  Risk OTC drugs. Prescription drug management.   This patient presents to the ED for concern of shortness of breaht, this involves an extensive number of treatment options, and is a complaint that carries with it a high risk of complications and morbidity.  The differential diagnosis includes but is not limited to Acute coronary syndrome, pneumonia, acute pulmonary edema, pneumothorax, acute anemia, pulmonary embolism   Comorbidities that complicate the patient evaluation: Patient's presentation is complicated by their history of COPD, CHF  Social Determinants of Health: Patient's unhoused and multiple ER visits and tobacco use   increases the complexity of managing their presentation  Additional history obtained: Records reviewed previous admission documents   Imaging Studies ordered: I ordered imaging studies including X-ray chest   I independently visualized and interpreted imaging which showed no acute findings I agree with the radiologist interpretation  Cardiac Monitoring: The patient was maintained on a cardiac monitor.  I personally viewed and interpreted the cardiac monitor which showed an underlying rhythm of:  sinus rhythm  Medicines ordered and prescription drug management: I ordered medication including nebulized treatments and steroids for wheezing Reevaluation of the patient after these medicines showed that the  patient    improved  Test Considered: I considered further testing, patient is overall stable at this time.  No indication for admission  Reevaluation: After the interventions noted above, I reevaluated the patient and found that they have :improved  Complexity of problems addressed: Patient's presentation is most consistent with  acute presentation with potential threat to life or bodily function  Disposition: After consideration of the diagnostic results and the patient's response to treatment,  I feel that the patent would benefit from discharge   .           Final Clinical Impression(s) / ED Diagnoses Final diagnoses:  COPD exacerbation ([place])  Rx / DC Orders ED Discharge Orders     None         Ripley Fraise, MD 08/19/22 [phone]

## 2022-08-19 NOTE — Discharge Planning (Signed)
Transition of Care [facility]) - Emergency Department Mini Assessment   Patient Details  Name: [name] [name] MRN: [id number] Date of Birth: 1971/05/28  [facility]) CM/SW Contact:    Fuller Mandril, RN Phone Number: 08/19/2022, 8:41 AM   Clinical Narrative: RNCM consulted regarding PCP establishment. Pt has has MULTIPLE ED visits (11) in last six months. RNCM obtained appointment on (3/25), time (0845) with [facility] and placed on After Visit Summary paperwork.  No further case management needs communicated at this time.   Kynzie Polgar J. Clydene Laming, RN, BSN, [place] [phone]              Interventions which prevented an admission or readmission: PCP Appointment Scheduled    Patient Contact and Communications        ,                 Admission diagnosis:  sob Patient Active Problem List   Diagnosis Date Noted   Superficial vein thrombosis 08/06/2022   Anxiety 08/05/2022   Heart failure ([place]) 08/04/2022   COPD with acute exacerbation ([place]) 08/04/2022   Acute kidney injury superimposed on chronic kidney disease ([place]) 08/04/2022   Acute on chronic systolic heart failure ([place]) 08/03/2022   Bronchitis 08/03/2022   CHF (congestive heart failure) ([place]) 08/03/2022   Acute on chronic systolic CHF (congestive heart failure) ([place]) 05/15/2022   Acute pyelonephritis 07/17/2020   Homelessness 06/12/2020   Bipolar 1 disorder ([place]) 12/18/2018   Suicide attempt ([place])    Overdose, intentional self-harm, initial encounter ([place]) 12/13/2018   Bipolar I disorder, single manic episode, severe, with psychosis ([place]) 06/09/2018   Bipolar disorder ([place]) 06/09/2018   DKA, type 2 ([place]) 01/06/2018   Diabetic acidosis without coma ([place])    Adjustment disorder with depressed mood 01/04/2018   Oral thrush    Acute lower UTI    DKA, type 2, not at goal [facility]) 01/03/2018   Thrush 01/03/2018   DM (diabetes mellitus), type 2, uncontrolled 06/07/2017   Abscess of groin, right  06/07/2017   Abscess 06/07/2017   Bipolar disorder with moderate depression ([place]) 08/31/2015   Tobacco use disorder 08/28/2015   HTN (hypertension) 08/28/2015   Type 2 diabetes mellitus with hyperlipidemia ([place]) 08/28/2015   Hyponatremia 08/28/2015   T2DM (type 2 diabetes mellitus) ([place]) 10/26/2009   OBESITY 10/26/2009   Essential hypertension 10/26/2009   DISTURBANCE OF SKIN SENSATION 10/26/2009   Migraine headache 03/05/2007   PCP:  Patient, No Pcp Per Pharmacy:   [facility] [address] [place] [address] Phone: [phone] Fax: [phone]  [facility] [place], [place] [address] Phone: [phone] Fax: [phone]  [place], [address] [place] [address] Phone: [phone] Fax: [phone]  [facility] [place], [place] AT [facility] [place] [address] Phone: [phone] Fax: [phone]

## 2022-08-20 ENCOUNTER — Inpatient Hospital Stay (HOSPITAL_COMMUNITY)
Admission: EM | Admit: 2022-08-20 | Discharge: 2022-08-24 | DRG: 682 | Disposition: A | Discharge status: Home / Self Care | Payer: Medicaid Other | Attending: Internal Medicine | Admitting: Internal Medicine

## 2022-08-20 ENCOUNTER — Encounter (HOSPITAL_COMMUNITY): Payer: Self-pay

## 2022-08-20 ENCOUNTER — Emergency Department (HOSPITAL_COMMUNITY): Payer: Medicaid Other

## 2022-08-20 ENCOUNTER — Emergency Department (HOSPITAL_COMMUNITY)
Admission: EM | Admit: 2022-08-20 | Discharge: 2022-08-20 | Disposition: A | Discharge status: Home / Self Care | Payer: Medicaid Other | Source: Home / Self Care | Attending: Emergency Medicine | Admitting: Emergency Medicine

## 2022-08-20 ENCOUNTER — Other Ambulatory Visit: Payer: Self-pay

## 2022-08-20 DIAGNOSIS — J9621 Acute and chronic respiratory failure with hypoxia: Secondary | ICD-10-CM | POA: Diagnosis not present

## 2022-08-20 DIAGNOSIS — I509 Heart failure, unspecified: Secondary | ICD-10-CM | POA: Insufficient documentation

## 2022-08-20 DIAGNOSIS — I429 Cardiomyopathy, unspecified: Secondary | ICD-10-CM | POA: Diagnosis present

## 2022-08-20 DIAGNOSIS — G8929 Other chronic pain: Secondary | ICD-10-CM | POA: Diagnosis present

## 2022-08-20 DIAGNOSIS — J441 Chronic obstructive pulmonary disease with (acute) exacerbation: Secondary | ICD-10-CM | POA: Diagnosis present

## 2022-08-20 DIAGNOSIS — E1122 Type 2 diabetes mellitus with diabetic chronic kidney disease: Secondary | ICD-10-CM | POA: Diagnosis present

## 2022-08-20 DIAGNOSIS — Z1152 Encounter for screening for COVID-19: Secondary | ICD-10-CM | POA: Insufficient documentation

## 2022-08-20 DIAGNOSIS — E871 Hypo-osmolality and hyponatremia: Secondary | ICD-10-CM | POA: Diagnosis present

## 2022-08-20 DIAGNOSIS — F319 Bipolar disorder, unspecified: Secondary | ICD-10-CM | POA: Diagnosis present

## 2022-08-20 DIAGNOSIS — Z79899 Other long term (current) drug therapy: Secondary | ICD-10-CM

## 2022-08-20 DIAGNOSIS — I10 Essential (primary) hypertension: Secondary | ICD-10-CM | POA: Diagnosis present

## 2022-08-20 DIAGNOSIS — D631 Anemia in chronic kidney disease: Secondary | ICD-10-CM | POA: Diagnosis present

## 2022-08-20 DIAGNOSIS — Z7951 Long term (current) use of inhaled steroids: Secondary | ICD-10-CM | POA: Insufficient documentation

## 2022-08-20 DIAGNOSIS — R079 Chest pain, unspecified: Secondary | ICD-10-CM

## 2022-08-20 DIAGNOSIS — Z888 Allergy status to other drugs, medicaments and biological substances status: Secondary | ICD-10-CM

## 2022-08-20 DIAGNOSIS — Z885 Allergy status to narcotic agent status: Secondary | ICD-10-CM

## 2022-08-20 DIAGNOSIS — D649 Anemia, unspecified: Secondary | ICD-10-CM

## 2022-08-20 DIAGNOSIS — Z91199 Patient's noncompliance with other medical treatment and regimen due to unspecified reason: Secondary | ICD-10-CM

## 2022-08-20 DIAGNOSIS — E1165 Type 2 diabetes mellitus with hyperglycemia: Secondary | ICD-10-CM | POA: Diagnosis present

## 2022-08-20 DIAGNOSIS — Z833 Family history of diabetes mellitus: Secondary | ICD-10-CM

## 2022-08-20 DIAGNOSIS — I130 Hypertensive heart and chronic kidney disease with heart failure and stage 1 through stage 4 chronic kidney disease, or unspecified chronic kidney disease: Secondary | ICD-10-CM | POA: Diagnosis present

## 2022-08-20 DIAGNOSIS — Z8249 Family history of ischemic heart disease and other diseases of the circulatory system: Secondary | ICD-10-CM

## 2022-08-20 DIAGNOSIS — F17210 Nicotine dependence, cigarettes, uncomplicated: Secondary | ICD-10-CM | POA: Diagnosis present

## 2022-08-20 DIAGNOSIS — R0602 Shortness of breath: Secondary | ICD-10-CM | POA: Insufficient documentation

## 2022-08-20 DIAGNOSIS — Z818 Family history of other mental and behavioral disorders: Secondary | ICD-10-CM

## 2022-08-20 DIAGNOSIS — N1831 Chronic kidney disease, stage 3a: Secondary | ICD-10-CM | POA: Diagnosis present

## 2022-08-20 DIAGNOSIS — E119 Type 2 diabetes mellitus without complications: Secondary | ICD-10-CM | POA: Insufficient documentation

## 2022-08-20 DIAGNOSIS — R739 Hyperglycemia, unspecified: Secondary | ICD-10-CM

## 2022-08-20 DIAGNOSIS — Z794 Long term (current) use of insulin: Secondary | ICD-10-CM | POA: Insufficient documentation

## 2022-08-20 DIAGNOSIS — Z5900 Homelessness unspecified: Secondary | ICD-10-CM

## 2022-08-20 DIAGNOSIS — Z9981 Dependence on supplemental oxygen: Secondary | ICD-10-CM

## 2022-08-20 DIAGNOSIS — N179 Acute kidney failure, unspecified: Principal | ICD-10-CM | POA: Diagnosis present

## 2022-08-20 DIAGNOSIS — I5022 Chronic systolic (congestive) heart failure: Secondary | ICD-10-CM | POA: Diagnosis present

## 2022-08-20 DIAGNOSIS — G43909 Migraine, unspecified, not intractable, without status migrainosus: Secondary | ICD-10-CM | POA: Diagnosis present

## 2022-08-20 DIAGNOSIS — E860 Dehydration: Secondary | ICD-10-CM | POA: Diagnosis present

## 2022-08-20 DIAGNOSIS — J449 Chronic obstructive pulmonary disease, unspecified: Secondary | ICD-10-CM | POA: Diagnosis present

## 2022-08-20 DIAGNOSIS — F209 Schizophrenia, unspecified: Secondary | ICD-10-CM | POA: Diagnosis present

## 2022-08-20 DIAGNOSIS — Z5901 Sheltered homelessness: Secondary | ICD-10-CM

## 2022-08-20 LAB — RESP PANEL BY RT-PCR (RSV, FLU A&B, COVID)  RVPGX2
Influenza A by PCR: NEGATIVE
Influenza B by PCR: NEGATIVE
Resp Syncytial Virus by PCR: NEGATIVE
SARS Coronavirus 2 by RT PCR: NEGATIVE

## 2022-08-20 LAB — TROPONIN I (HIGH SENSITIVITY)
Troponin I (High Sensitivity): 34 ng/L — ABNORMAL HIGH (ref <18)
Troponin I (High Sensitivity): 31 ng/L — ABNORMAL HIGH (ref <18)

## 2022-08-20 LAB — I-STAT VENOUS BLOOD GAS, ED
Acid-Base Excess: 5 mmol/L — ABNORMAL HIGH (ref 0.0–2.0)
Bicarbonate: 29.5 mmol/L — ABNORMAL HIGH (ref 20.0–28.0)
Calcium, Ion: 1.01 mmol/L — ABNORMAL LOW (ref 1.15–1.40)
HCT: 34 % — ABNORMAL LOW (ref 36.0–46.0)
Hemoglobin: 11.6 g/dL — ABNORMAL LOW (ref 12.0–15.0)
O2 Saturation: 93 %
Potassium: 3.7 mmol/L (ref 3.5–5.1)
Sodium: 136 mmol/L (ref 135–145)
TCO2: 31 mmol/L (ref 22–32)
pCO2, Ven: 42.6 mmHg — ABNORMAL LOW (ref 44–60)
pH, Ven: 7.449 — ABNORMAL HIGH (ref 7.25–7.43)
pO2, Ven: 64 mmHg — ABNORMAL HIGH (ref 32–45)

## 2022-08-20 LAB — BASIC METABOLIC PANEL
Anion gap: 12 (ref 5–15)
BUN: 25 mg/dL — ABNORMAL HIGH (ref 6–20)
CO2: 24 mmol/L (ref 22–32)
Calcium: 7.4 mg/dL — ABNORMAL LOW (ref 8.9–10.3)
Chloride: 102 mmol/L (ref 98–111)
Creatinine, Ser: 1.09 mg/dL — ABNORMAL HIGH (ref 0.44–1.00)
GFR, Estimated: >60 mL/min (ref >60)
Glucose, Bld: 268 mg/dL — ABNORMAL HIGH (ref 70–99)
Potassium: 3.1 mmol/L — ABNORMAL LOW (ref 3.5–5.1)
Sodium: 138 mmol/L (ref 135–145)

## 2022-08-20 LAB — CBC
HCT: 34.6 % — ABNORMAL LOW (ref 36.0–46.0)
Hemoglobin: 10.5 g/dL — ABNORMAL LOW (ref 12.0–15.0)
MCH: 28.1 pg (ref 26.0–34.0)
MCHC: 30.3 g/dL (ref 30.0–36.0)
MCV: 92.5 fL (ref 80.0–100.0)
Platelets: 192 10*3/uL (ref 150–400)
RBC: 3.74 MIL/uL — ABNORMAL LOW (ref 3.87–5.11)
RDW: 15.9 % — ABNORMAL HIGH (ref 11.5–15.5)
WBC: 5.8 10*3/uL (ref 4.0–10.5)
nRBC: 0 % (ref 0.0–0.2)

## 2022-08-20 LAB — I-STAT BETA HCG BLOOD, ED (MC, WL, AP ONLY): I-stat hCG, quantitative: <5 m[IU]/mL (ref <5)

## 2022-08-20 LAB — CBG MONITORING, ED: Glucose-Capillary: >600 mg/dL (ref 70–99)

## 2022-08-20 LAB — BRAIN NATRIURETIC PEPTIDE: B Natriuretic Peptide: 368.1 pg/mL — ABNORMAL HIGH (ref 0.0–100.0)

## 2022-08-20 MED ORDER — PREDNISONE 20 MG PO TABS
60.0000 mg | ORAL_TABLET | Freq: Every day | ORAL | 0 refills | Status: DC
  Filled 2022-08-20: qty 12, 4d supply, fill #0

## 2022-08-20 MED ORDER — PREDNISONE 20 MG PO TABS: 60.0000 mg | ORAL_TABLET | Freq: Every day | ORAL | 0 refills | Status: DC

## 2022-08-20 MED ORDER — POTASSIUM CHLORIDE CRYS ER 20 MEQ PO TBCR
40.0000 meq | EXTENDED_RELEASE_TABLET | ORAL | Status: AC
  Administered 2022-08-20: 40 meq via ORAL
  Filled 2022-08-20: qty 2

## 2022-08-20 MED ORDER — PREDNISONE 20 MG PO TABS
60.0000 mg | ORAL_TABLET | Freq: Once | ORAL | Status: AC
  Administered 2022-08-20: 60 mg via ORAL
  Filled 2022-08-20: qty 3

## 2022-08-20 MED ORDER — ALBUTEROL SULFATE (2.5 MG/3ML) 0.083% IN NEBU
2.5000 mg | INHALATION_SOLUTION | Freq: Once | RESPIRATORY_TRACT | Status: AC
  Administered 2022-08-20: 2.5 mg via RESPIRATORY_TRACT
  Filled 2022-08-20: qty 3

## 2022-08-20 NOTE — ED Provider Notes (Signed)
[facility] Provider Note   CSN: [id number] Arrival date & time: 08/20/22  1321     History  Chief Complaint  Patient presents with   Chest Pain    [name] [name] is a 52 y.o. female.  52 year old female with a history of COPD, CHF, tobacco use, homelessness, and insulin-dependent diabetes who presents to the emergency department with chest pain and shortness of breath.  Patient reports that for the past month she has had off-and-on chest pain.  Describes it as substernal and heavy sensation as though something is sitting on her chest.  Says that several hours prior to arrival she started feeling it is as well as radiation down her left arm.  Says that she called 911 and was given 324 mg of chewable aspirin by EMS.  Took a nitroglycerin before and did not significantly change her pain.  Thinks that it may be exertional but is not pleuritic.  Has been having cough and shortness of breath for the past 2 to 3 months as well.  Also with recent development of congestion and sore throat.  Was seen in the emergency department several times including yesterday when she was diagnosed with a COPD exacerbation.  She refused prednisone because it is caused her to have psychiatric issues in the past.  Father had MI at the age of 26.  She does not have any cardiac history or stents.  Denies any other recreational substance use.   From [facility] Op Note - Deepak Pasi, MD - 10/11/2020 11:39 AM EDT Formatting of this note is different from the original. [place] REPORT  Patient: [name] [name] [birth year] 52 y.o.  Referring Provider: Dr. Sofie Rower  Conclusions:  Normal coronaries  Dilated left ventricle with severe global hypokinesis with an ejection fraction of 30%  Elevated left ventricular end-diastolic pressure at 26 mmHg  Plan:  Treat for cardiomyopathy  Access Site: Right radial  Cardiologist:  None  Procedures Performed: Left heart catheterization, coronary arteriography, left ventriculography  Indication: Newly diagnosed cardiomyopathy  History: [name] [name] is a 52 y.o. female who presents with newly diagnosed cardiomyopathy and congestive heart failure  Appropriate Use Criteria:  Reason for catheterization: newly diagnosed congestive heart failure  Assessment for dual antiplatelet therapy: Patient was assessed prior to procedure and is an acceptable candidate for dual antiplatelet therapy.  CSHA Frailty Score: 2  FINDINGS:  Coronary Angiography:  Dominance: Right  Left Main: Normal left main  LAD: Normal LAD  CIRC: Nondominant and normal circumflex  RCA: Dominant and normal right  SYNTAX SCORE: Not applicable  Left Ventriculogram: Dilated left ventricle with severe global hypokinesis with ejection fraction of 30 to 35%  Hemodynamics: Elevated left ventricular end-diastolic pressure at 26 mmHg  PROCEDURE DESCRIPTION:  Anesthesia: Lidociane locally was injected at the right radial site. Prior to the administration of sedation, the patient was evaluated with pre procedure documentation completed - refer to Immediate Pre Procedure Airway & Anesthesia/Pre-Sedation Reassessment Form. Moderate Sedation with Fentanyl 25 mcg and Versed 3 mg IV was used for this procedure. I personally spent 22 minutes continuously monitoring the patient during the administration of the sedation for this procedure. Patient assessed post sedation/anesthesia and tolerated well. Vital signs stable and no complications noted.  Contrast Used: Omnipaque  Catheters Used: 6 [place] radial sheath, 5 [place] FL 3.5 diagnostic, 5 [place] JR4.  Comments: Right radial artery was used, TR band used for hemostasis  Complications: None  Estimated Blood Loss: Minimal  Deepak Pasi, MD, F.R.C.P., F.A.C.C., F.S.C.A.I. 10/11/2020 11:39 AM         Home Medications Prior to Admission  medications   Medication Sig Start Date End Date Taking? Authorizing Provider  acetaminophen (TYLENOL) 325 MG tablet Take 2 tablets (650 mg total) by mouth every 4 (four) hours as needed for headache or mild pain. Patient not taking: Reported on 08/21/2022 08/06/22   Arrien, Jimmy Picket, MD  albuterol (PROVENTIL) (2.5 MG/3ML) 0.083% nebulizer solution Take 3 mLs (2.5 mg total) by nebulization every 6 (six) hours as needed for shortness of breath. Patient not taking: Reported on 08/21/2022 08/06/22   Arrien, Jimmy Picket, MD  ASPIRIN LOW DOSE 81 MG tablet Take 81 mg by mouth daily. Patient not taking: Reported on 08/21/2022 03/15/22   [provider]  atorvastatin (LIPITOR) 20 MG tablet Take 20 mg by mouth daily. 03/15/22 08/21/22  [provider]  blood glucose meter kit and supplies Dispense based on patient and insurance preference. Use up to four times daily as directed. (FOR ICD-10 E10.9, E11.9). 01/06/18   Eugenie Filler, MD  bumetanide (BUMEX) 2 MG tablet Take 1 tablet (2 mg total) by mouth 2 (two) times daily. 08/06/22 09/05/22  Arrien, Jimmy Picket, MD  fluticasone-salmeterol (ADVAIR DISKUS) 250-50 MCG/ACT AEPB Inhale 1 puff into the lungs in the morning and at bedtime. Patient not taking: Reported on 08/21/2022 08/06/22   Arrien, Jimmy Picket, MD  guaiFENesin (ROBITUSSIN) 100 MG/5ML liquid Take 5 mLs by mouth every 4 (four) hours as needed for cough or to loosen phlegm. Patient not taking: Reported on 08/21/2022 07/07/22   Wynona Dove A, DO  insulin glargine (LANTUS SOLOSTAR) 100 UNIT/ML Solostar Pen Inject 20 Units into the skin in the morning. Do not take if blood sugar is <100. Patient not taking: Reported on 08/21/2022 08/14/22   Quintella Reichert, MD  isosorbide mononitrate (IMDUR) 60 MG 24 hr tablet Take 1 tablet (60 mg total) by mouth daily. Patient not taking: Reported on 08/21/2022 08/06/22 09/05/22  Arrien, Jimmy Picket, MD  metoprolol succinate (TOPROL-XL) 25 MG  24 hr tablet Take 1 tablet (25 mg total) by mouth daily. Patient not taking: Reported on 08/21/2022 08/06/22 09/05/22  Arrien, Jimmy Picket, MD  nitroGLYCERIN (NITROSTAT) 0.4 MG SL tablet Place 0.4 mg under the tongue every 5 (five) minutes as needed for chest pain.    [provider]  oxyCODONE-acetaminophen (PERCOCET) 10-325 MG tablet Take 1 tablet by mouth every 4 (four) hours as needed for pain. Patient not taking: Reported on 08/21/2022    [provider]  potassium chloride SA (KLOR-CON M) 20 MEQ tablet Take 20 mEq by mouth daily. Patient not taking: Reported on 08/21/2022 01/16/22   [provider]  predniSONE (DELTASONE) 20 MG tablet Take 3 tablets (60 mg total) by mouth daily. Patient not taking: Reported on 08/21/2022 08/20/22   Hayden Rasmussen, MD  risperiDONE (RISPERDAL) 2 MG tablet Take half tablet in the morning and one tablet in the evening. Patient taking differently: Take 1-2 mg by mouth See admin instructions. Take half tablet in the morning and one tablet in the evening. 08/06/22   Arrien, Jimmy Picket, MD  benztropine (COGENTIN) 1 MG tablet Take 1 tablet (1 mg total) by mouth 2 (two) times daily. 12/23/18 06/04/20  Johnn Hai, MD  insulin aspart (NOVOLOG) 100 UNIT/ML injection Inject 0-9 Units into the skin 3 (three) times daily with meals. Patient not taking: Reported on 06/03/2020 12/16/18 06/04/20  Antonieta Pert, MD      Allergies    Tramadol, Wellbutrin [bupropion], and Nicotine    Review of Systems   Review of Systems  Physical Exam Updated Vital Signs BP 101/64   Pulse 87   Temp 97.9 F (36.6 C) (Oral)   Resp 20   Ht '5\' 6"'$  (1.676 m)   Wt 80.3 kg   SpO2 98%   BMI 28.57 kg/m  Physical Exam Vitals and nursing note reviewed.  Constitutional:      General: She is not in acute distress.    Appearance: She is well-developed.     Comments: Drowsy  HENT:     Head: Normocephalic and atraumatic.     Right Ear: External ear normal.      Left Ear: External ear normal.     Nose: Nose normal.  Eyes:     Extraocular Movements: Extraocular movements intact.     Conjunctiva/sclera: Conjunctivae normal.     Pupils: Pupils are equal, round, and reactive to light.  Cardiovascular:     Rate and Rhythm: Normal rate and regular rhythm.     Heart sounds: No murmur heard. Pulmonary:     Effort: Pulmonary effort is normal. No respiratory distress.     Breath sounds: Wheezing (Diffuse, faint) present.  Musculoskeletal:     Cervical back: Normal range of motion and neck supple.     Right lower leg: No edema.     Left lower leg: No edema.  Skin:    General: Skin is warm and dry.  Neurological:     Mental Status: She is alert and oriented to person, place, and time. Mental status is at baseline.  Psychiatric:        Mood and Affect: Mood normal.     ED Results / Procedures / Treatments   Labs (all labs ordered are listed, but only abnormal results are displayed) Labs Reviewed  BASIC METABOLIC PANEL - Abnormal; Notable for the following components:      Result Value   Potassium 3.1 (*)    Glucose, Bld 268 (*)    BUN 25 (*)    Creatinine, Ser 1.09 (*)    Calcium 7.4 (*)    All other components within normal limits  CBC - Abnormal; Notable for the following components:   RBC 3.74 (*)    Hemoglobin 10.5 (*)    HCT 34.6 (*)    RDW 15.9 (*)    All other components within normal limits  BRAIN NATRIURETIC PEPTIDE - Abnormal; Notable for the following components:   B Natriuretic Peptide 368.1 (*)    All other components within normal limits  I-STAT VENOUS BLOOD GAS, ED - Abnormal; Notable for the following components:   pH, Ven 7.449 (*)    pCO2, Ven 42.6 (*)    pO2, Ven 64 (*)    Bicarbonate 29.5 (*)    Acid-Base Excess 5.0 (*)    Calcium, Ion 1.01 (*)    HCT 34.0 (*)    Hemoglobin 11.6 (*)    All other components within normal limits  TROPONIN I (HIGH SENSITIVITY) - Abnormal; Notable for the following components:    Troponin I (High Sensitivity) 34 (*)    All other components within normal limits  TROPONIN I (HIGH SENSITIVITY) - Abnormal; Notable for the following components:   Troponin I (High Sensitivity) 31 (*)    All other components within normal limits  RESP PANEL BY RT-PCR (RSV, FLU A&B, COVID)  RVPGX2  I-STAT  BETA HCG BLOOD, ED ([facility], [facility], [facility] ONLY)    EKG EKG Interpretation  Date/Time:  Tuesday August 20 2022 13:55:45 EDT Ventricular Rate:  81 PR Interval:  172 QRS Duration: 104 QT Interval:  388 QTC Calculation: 450 R Axis:   28 Text Interpretation: Normal sinus rhythm Biatrial enlargement Minimal voltage criteria for LVH, may be normal variant ( Cornell product ) Septal infarct , age undetermined Abnormal ECG When compared with ECG of 19-Aug-2022 04:52, No significant change since last tracing Confirmed by Margaretmary Eddy [id number]) on 08/20/2022 3:21:13 PM  Radiology DG Chest 2 View  Result Date: 08/20/2022 CLINICAL DATA:  Patient notes upper chest tightness that has been radiating to her left arm. She notes this started a few days ago, but has happened before due to her Hx CHF. She also notes SOB, dry cough, DM, asthma. EXAM: CHEST - 2 VIEW COMPARISON:  08/19/2022 FINDINGS: Lungs are clear. Mild cardiomegaly. No effusion. Visualized bones unremarkable. IMPRESSION: Mild cardiomegaly.  No acute findings. Electronically Signed   By: Lucrezia Europe M.D.   On: 08/20/2022 14:35    Procedures Procedures    Medications Ordered in ED Medications  albuterol (PROVENTIL) (2.5 MG/3ML) 0.083% nebulizer solution 2.5 mg (2.5 mg Nebulization Given 08/20/22 1623)  predniSONE (DELTASONE) tablet 60 mg (60 mg Oral Given 08/20/22 1719)  potassium chloride SA (KLOR-CON M) CR tablet 40 mEq (40 mEq Oral Given 08/20/22 1718)    ED Course/ Medical Decision Making/ A&P Clinical Course as of 08/21/22 1353  Tue Aug 20, 2022  1658 Dr Melina Copa [RP]    Clinical Course User Index [RP] Fransico Meadow, MD                   Medical Decision Making Amount and/or Complexity of Data Reviewed Labs: ordered. Radiology: ordered.  Risk Prescription drug management.   [name] [name] is a 52 y.o. female with comorbidities that complicate the patient evaluation including  COPD, CHF, tobacco use, homelessness, and insulin-dependent diabetes who presents to the emergency department with chest pain and shortness of breath.   Initial Ddx:  COPD exacerbation, URI, pneumonia, heart failure exacerbation, MI, PE  MDM:  With the patient's wheezing feel that she is likely having COPD exacerbation.  Will obtain chest x-ray to evaluate for pneumonia or pulmonary edema that would be consistent with heart failure exacerbation.  Also considered MI so we will send troponins specially with her chest pain.  Considered PE but since she is not hypoxic and does not have pleuritic chest pain and we have another alternative cause feel that this is less likely.    Plan:  Labs Troponin BNP Chest x-ray EKG  ED Summary/Re-evaluation:  Patient underwent the above workup.  EKG without new ischemic changes.  Does have elevated troponin but is down from prior (today is 34 and previously was in the 50s.).  Suspect this is likely chronic for her rather than an acute ischemic event especially with her heart catheterization showing clean coronaries on 10/2020.  Patient given albuterol in the emergency department with some improvement of her symptoms.  Says that she is willing to take prednisone today.  Started become less responsive in the emergency department as I suspect is volitional so VBG was sent which did not show evidence of hyperkalemia.  CBG was WNL.  With her homelessness concerned that there may be some component of secondary gain to these visits.  Patient has repeat troponin pending now but if unchanged feel that she can  likely be discharged with cardiology follow-up.   This patient presents to the ED for concern of complaints listed in  HPI, this involves an extensive number of treatment options, and is a complaint that carries with it a high risk of complications and morbidity. Disposition including potential need for admission considered.   Dispo: Pending remainder of workup  Records reviewed ED Visit Notes The following labs were independently interpreted: Chemistry and Serial Troponins and show no acute abnormality I independently reviewed the following imaging with scope of interpretation limited to determining acute life threatening conditions related to emergency care: Chest x-ray and agree with the radiologist interpretation with the following exceptions: none I personally reviewed and interpreted cardiac monitoring: normal sinus rhythm  I personally reviewed and interpreted the pt's EKG: see above for interpretation  I have reviewed the patients home medications and made adjustments as needed Social Determinants of health:  Homeless   Final Clinical Impression(s) / ED Diagnoses Final diagnoses:  Nonspecific chest pain  COPD exacerbation ([place])    Rx / DC Orders ED Discharge Orders          Ordered    predniSONE (DELTASONE) 20 MG tablet  Daily,   Status:  Discontinued        08/20/22 1756    predniSONE (DELTASONE) 20 MG tablet  Daily        08/20/22 1821              Fransico Meadow, MD 08/21/22 1354

## 2022-08-20 NOTE — ED Notes (Signed)
Pt found sleeping. States she still wants to be seen.

## 2022-08-20 NOTE — ED Notes (Signed)
Pt on neb aerosol mask 100% O2 8L at time of VBG

## 2022-08-20 NOTE — ED Notes (Signed)
EDP at [facility]. Pt alert, NAD, calm, interactive, resps e/u, speaking in clear complete sentences.

## 2022-08-20 NOTE — ED Triage Notes (Addendum)
Pt says she has not had her insulin since December 28th. Glucose reading "high" in triage. Pt says she is feeling weak, tired, blurry vision. Pt says she is supposed to be on oxygen "24/7".

## 2022-08-20 NOTE — Discharge Instructions (Addendum)
You were seen in the emergency department for chest pain shortness of breath.  Your workup did not show any evidence of heart attack.  We are treating you with prednisone and an inhaler for a COPD exacerbation.  Please stop smoking.  Inhaler 2 puffs every 4 hours as needed.  Prednisone for 4 more days.  Return to the emergency department if any worsening or concerning symptoms

## 2022-08-20 NOTE — ED Provider Triage Note (Signed)
Emergency Medicine Provider Triage Evaluation Note  [name] [name] , a 52 y.o. female  was evaluated in triage.  Pt complains of elevated blood sugar, states that she has been without her insulin since December, states that she is unable to afford it, states that today she was experiencing tired, fatigue, increased thirst, just not feeling well.  No fever chills cough congestion no stomach pains no nausea or vomiting..  Review of Systems  Positive: Fevers, congestion Negative: Nausea vomiting  Physical Exam  BP 110/69   Pulse 97   Temp 98.6 F (37 C) (Oral)   Resp 16   SpO2 94%  Gen:   Awake, no distress   Resp:  Normal effort  MSK:   Moves extremities without difficulty  Other:    Medical Decision Making  Medically screening exam initiated at 11:16 PM.  Appropriate orders placed.  [name] [name] was informed that the remainder of the evaluation will be completed by another provider, this initial triage assessment does not replace that evaluation, and the importance of remaining in the ED until their evaluation is complete.  Lab work has been ordered will need further workup.   Marcello Fennel, PA-C 08/20/22 2317

## 2022-08-20 NOTE — ED Triage Notes (Signed)
Pt arrives via EMS from the [facility]. Pt c/o chest pain for the past couple of days. Pt seen on the 11th. Pt took 1 nitro prior to ems arrival with no relief.

## 2022-08-20 NOTE — ED Provider Triage Note (Signed)
Emergency Medicine Provider Triage Evaluation Note  [name] [name] , a 52 y.o. female  was evaluated in triage.  Pt complains of chest pain for the past few days and lower extremity edema. History of CHF. Seen yesterday for COPD exacerbation. Chronic pain, unable to refill pain medication.   Review of Systems  Positive: CP Negative: fever  Physical Exam  BP 100/61   Pulse 81   Temp 97.6 F (36.4 C)   Resp 17   SpO2 96%  Gen:   Awake, no distress   Resp:  Normal effort  MSK:   Moves extremities without difficulty  Other:    Medical Decision Making  Medically screening exam initiated at 1:37 PM.  Appropriate orders placed.  [name] [name] was informed that the remainder of the evaluation will be completed by another provider, this initial triage assessment does not replace that evaluation, and the importance of remaining in the ED until their evaluation is complete.  Cardiac labs BNP   Suzy Bouchard, [place] 08/20/22 1338

## 2022-08-20 NOTE — ED Notes (Signed)
EDP at [facility]. Labs drawn/ sent. PT sleepy, interactive, cooperative, answering questions appropriately.

## 2022-08-20 NOTE — ED Provider Notes (Signed)
Signout from Dr. Philip Aspen.  52 year old female history of COPD CHF homelessness here with chest pain shortness of breath.  Being treated for COPD with prednisone and inhaler.  Plan is to follow-up on delta troponin.  If this is not significantly rising patient could be discharged to follow-up with her outpatient providers. Physical Exam  BP 100/61   Pulse 81   Temp 97.6 F (36.4 C)   Resp 17   Ht '5\' 6"'$  (1.676 m)   Wt 80.3 kg   SpO2 96%   BMI 28.57 kg/m   Physical Exam  Procedures  Procedures  ED Course / MDM   Clinical Course as of 08/20/22 1717  Tue Aug 20, 2022  1658 Dr Melina Copa [RP]    Clinical Course User Index [RP] Fransico Meadow, MD   Medical Decision Making Amount and/or Complexity of Data Reviewed Labs: ordered. Radiology: ordered.  Risk Prescription drug management.   Patient's troponin does not show any significant rise.  I reviewed her workup with her.  Given prescription for prednisone although she is diabetic and already has troubles controlling her blood sugars.  I asked her to just use her inhaler for now and if she continues to worsen she can add the prednisone.  Return instructions discussed       Hayden Rasmussen, MD 08/21/22 1115

## 2022-08-21 ENCOUNTER — Other Ambulatory Visit (HOSPITAL_COMMUNITY): Payer: Self-pay

## 2022-08-21 ENCOUNTER — Encounter (HOSPITAL_COMMUNITY): Payer: Self-pay

## 2022-08-21 DIAGNOSIS — R739 Hyperglycemia, unspecified: Secondary | ICD-10-CM

## 2022-08-21 DIAGNOSIS — F319 Bipolar disorder, unspecified: Secondary | ICD-10-CM | POA: Diagnosis present

## 2022-08-21 DIAGNOSIS — E1165 Type 2 diabetes mellitus with hyperglycemia: Secondary | ICD-10-CM | POA: Diagnosis present

## 2022-08-21 DIAGNOSIS — N1831 Chronic kidney disease, stage 3a: Secondary | ICD-10-CM | POA: Diagnosis present

## 2022-08-21 DIAGNOSIS — Z5900 Homelessness unspecified: Secondary | ICD-10-CM

## 2022-08-21 DIAGNOSIS — Z8249 Family history of ischemic heart disease and other diseases of the circulatory system: Secondary | ICD-10-CM | POA: Diagnosis not present

## 2022-08-21 DIAGNOSIS — F17210 Nicotine dependence, cigarettes, uncomplicated: Secondary | ICD-10-CM | POA: Diagnosis present

## 2022-08-21 DIAGNOSIS — E860 Dehydration: Secondary | ICD-10-CM | POA: Diagnosis present

## 2022-08-21 DIAGNOSIS — D649 Anemia, unspecified: Secondary | ICD-10-CM

## 2022-08-21 DIAGNOSIS — J441 Chronic obstructive pulmonary disease with (acute) exacerbation: Secondary | ICD-10-CM | POA: Diagnosis present

## 2022-08-21 DIAGNOSIS — E1122 Type 2 diabetes mellitus with diabetic chronic kidney disease: Secondary | ICD-10-CM | POA: Diagnosis present

## 2022-08-21 DIAGNOSIS — F209 Schizophrenia, unspecified: Secondary | ICD-10-CM | POA: Diagnosis present

## 2022-08-21 DIAGNOSIS — Z1152 Encounter for screening for COVID-19: Secondary | ICD-10-CM | POA: Diagnosis not present

## 2022-08-21 DIAGNOSIS — N179 Acute kidney failure, unspecified: Secondary | ICD-10-CM | POA: Insufficient documentation

## 2022-08-21 DIAGNOSIS — Z888 Allergy status to other drugs, medicaments and biological substances status: Secondary | ICD-10-CM | POA: Diagnosis not present

## 2022-08-21 DIAGNOSIS — I130 Hypertensive heart and chronic kidney disease with heart failure and stage 1 through stage 4 chronic kidney disease, or unspecified chronic kidney disease: Secondary | ICD-10-CM | POA: Diagnosis present

## 2022-08-21 DIAGNOSIS — I5022 Chronic systolic (congestive) heart failure: Secondary | ICD-10-CM | POA: Diagnosis present

## 2022-08-21 DIAGNOSIS — D631 Anemia in chronic kidney disease: Secondary | ICD-10-CM | POA: Diagnosis present

## 2022-08-21 DIAGNOSIS — G8929 Other chronic pain: Secondary | ICD-10-CM | POA: Diagnosis present

## 2022-08-21 DIAGNOSIS — G43909 Migraine, unspecified, not intractable, without status migrainosus: Secondary | ICD-10-CM | POA: Diagnosis present

## 2022-08-21 DIAGNOSIS — J9621 Acute and chronic respiratory failure with hypoxia: Secondary | ICD-10-CM | POA: Diagnosis not present

## 2022-08-21 DIAGNOSIS — E871 Hypo-osmolality and hyponatremia: Secondary | ICD-10-CM | POA: Diagnosis present

## 2022-08-21 DIAGNOSIS — Z885 Allergy status to narcotic agent status: Secondary | ICD-10-CM | POA: Diagnosis not present

## 2022-08-21 DIAGNOSIS — I429 Cardiomyopathy, unspecified: Secondary | ICD-10-CM | POA: Diagnosis present

## 2022-08-21 DIAGNOSIS — R079 Chest pain, unspecified: Secondary | ICD-10-CM | POA: Diagnosis present

## 2022-08-21 DIAGNOSIS — Z79899 Other long term (current) drug therapy: Secondary | ICD-10-CM | POA: Diagnosis not present

## 2022-08-21 DIAGNOSIS — Z5901 Sheltered homelessness: Secondary | ICD-10-CM | POA: Diagnosis not present

## 2022-08-21 LAB — COMPREHENSIVE METABOLIC PANEL
ALT: 30 U/L (ref 0–44)
AST: 23 U/L (ref 15–41)
Albumin: 3.8 g/dL (ref 3.5–5.0)
Alkaline Phosphatase: 101 U/L (ref 38–126)
Anion gap: 16 — ABNORMAL HIGH (ref 5–15)
BUN: 32 mg/dL — ABNORMAL HIGH (ref 6–20)
CO2: 24 mmol/L (ref 22–32)
Calcium: 8.5 mg/dL — ABNORMAL LOW (ref 8.9–10.3)
Chloride: 89 mmol/L — ABNORMAL LOW (ref 98–111)
Creatinine, Ser: 1.61 mg/dL — ABNORMAL HIGH (ref 0.44–1.00)
GFR, Estimated: 38 mL/min — ABNORMAL LOW (ref >60)
Glucose, Bld: 686 mg/dL (ref 70–99)
Potassium: 5 mmol/L (ref 3.5–5.1)
Sodium: 129 mmol/L — ABNORMAL LOW (ref 135–145)
Total Bilirubin: 0.4 mg/dL (ref 0.3–1.2)
Total Protein: 6.6 g/dL (ref 6.5–8.1)

## 2022-08-21 LAB — GLUCOSE, CAPILLARY
Glucose-Capillary: 181 mg/dL — ABNORMAL HIGH (ref 70–99)
Glucose-Capillary: 191 mg/dL — ABNORMAL HIGH (ref 70–99)
Glucose-Capillary: 194 mg/dL — ABNORMAL HIGH (ref 70–99)
Glucose-Capillary: 244 mg/dL — ABNORMAL HIGH (ref 70–99)
Glucose-Capillary: 246 mg/dL — ABNORMAL HIGH (ref 70–99)
Glucose-Capillary: 301 mg/dL — ABNORMAL HIGH (ref 70–99)

## 2022-08-21 LAB — CBC
HCT: 34.4 % — ABNORMAL LOW (ref 36.0–46.0)
Hemoglobin: 11 g/dL — ABNORMAL LOW (ref 12.0–15.0)
MCH: 28.6 pg (ref 26.0–34.0)
MCHC: 32 g/dL (ref 30.0–36.0)
MCV: 89.6 fL (ref 80.0–100.0)
Platelets: 190 10*3/uL (ref 150–400)
RBC: 3.84 MIL/uL — ABNORMAL LOW (ref 3.87–5.11)
RDW: 15.6 % — ABNORMAL HIGH (ref 11.5–15.5)
WBC: 6.1 10*3/uL (ref 4.0–10.5)
nRBC: 0 % (ref 0.0–0.2)

## 2022-08-21 LAB — CBC WITH DIFFERENTIAL/PLATELET
Abs Immature Granulocytes: 0.05 10*3/uL (ref 0.00–0.07)
Basophils Absolute: 0 10*3/uL (ref 0.0–0.1)
Basophils Relative: 0 %
Eosinophils Absolute: 0 10*3/uL (ref 0.0–0.5)
Eosinophils Relative: 0 %
HCT: 36.2 % (ref 36.0–46.0)
Hemoglobin: 11.6 g/dL — ABNORMAL LOW (ref 12.0–15.0)
Immature Granulocytes: 1 %
Lymphocytes Relative: 5 %
Lymphs Abs: 0.4 10*3/uL — ABNORMAL LOW (ref 0.7–4.0)
MCH: 28.9 pg (ref 26.0–34.0)
MCHC: 32 g/dL (ref 30.0–36.0)
MCV: 90 fL (ref 80.0–100.0)
Monocytes Absolute: 0.1 10*3/uL (ref 0.1–1.0)
Monocytes Relative: 1 %
Neutro Abs: 7.3 10*3/uL (ref 1.7–7.7)
Neutrophils Relative %: 93 %
Platelets: 213 10*3/uL (ref 150–400)
RBC: 4.02 MIL/uL (ref 3.87–5.11)
RDW: 15.7 % — ABNORMAL HIGH (ref 11.5–15.5)
WBC: 7.8 10*3/uL (ref 4.0–10.5)
nRBC: 0 % (ref 0.0–0.2)

## 2022-08-21 LAB — BASIC METABOLIC PANEL
Anion gap: 13 (ref 5–15)
BUN: 33 mg/dL — ABNORMAL HIGH (ref 6–20)
CO2: 24 mmol/L (ref 22–32)
Calcium: 8.1 mg/dL — ABNORMAL LOW (ref 8.9–10.3)
Chloride: 97 mmol/L — ABNORMAL LOW (ref 98–111)
Creatinine, Ser: 1.6 mg/dL — ABNORMAL HIGH (ref 0.44–1.00)
GFR, Estimated: 38 mL/min — ABNORMAL LOW (ref >60)
Glucose, Bld: 537 mg/dL (ref 70–99)
Potassium: 4.1 mmol/L (ref 3.5–5.1)
Sodium: 134 mmol/L — ABNORMAL LOW (ref 135–145)

## 2022-08-21 LAB — CBG MONITORING, ED
Glucose-Capillary: 423 mg/dL — ABNORMAL HIGH (ref 70–99)
Glucose-Capillary: 527 mg/dL (ref 70–99)
Glucose-Capillary: 592 mg/dL (ref 70–99)

## 2022-08-21 LAB — LIPASE, BLOOD: Lipase: 32 U/L (ref 11–51)

## 2022-08-21 MED ORDER — ACETAMINOPHEN 325 MG PO TABS
650.0000 mg | ORAL_TABLET | Freq: Once | ORAL | Status: AC
  Administered 2022-08-21: 650 mg via ORAL
  Filled 2022-08-21: qty 2

## 2022-08-21 MED ORDER — INSULIN ASPART 100 UNIT/ML IJ SOLN: 10.0000 [IU] | Freq: Once | INTRAMUSCULAR | Status: DC

## 2022-08-21 MED ORDER — PREDNISOLONE 5 MG PO TABS
20.0000 mg | ORAL_TABLET | Freq: Every day | ORAL | Status: DC
  Administered 2022-08-21: 20 mg via ORAL
  Filled 2022-08-21 (×2): qty 4

## 2022-08-21 MED ORDER — INSULIN GLARGINE-YFGN 100 UNIT/ML ~~LOC~~ SOLN
15.0000 [IU] | Freq: Every day | SUBCUTANEOUS | Status: DC
  Administered 2022-08-21 – 2022-08-23 (×4): 15 [IU] via SUBCUTANEOUS
  Filled 2022-08-21 (×5): qty 0.15

## 2022-08-21 MED ORDER — ONDANSETRON HCL 4 MG/2ML IJ SOLN
4.0000 mg | Freq: Four times a day (QID) | INTRAMUSCULAR | Status: DC | PRN
  Administered 2022-08-21: 4 mg via INTRAVENOUS
  Filled 2022-08-21: qty 2

## 2022-08-21 MED ORDER — LACTATED RINGERS IV SOLN: INTRAVENOUS | Status: DC

## 2022-08-21 MED ORDER — SODIUM CHLORIDE 0.9 % IV BOLUS
1000.0000 mL | Freq: Once | INTRAVENOUS | Status: AC
  Administered 2022-08-21: 1000 mL via INTRAVENOUS

## 2022-08-21 MED ORDER — INSULIN ASPART 100 UNIT/ML IJ SOLN: 15.0000 [IU] | Freq: Once | INTRAMUSCULAR | Status: DC

## 2022-08-21 MED ORDER — ACETAMINOPHEN 325 MG PO TABS
650.0000 mg | ORAL_TABLET | Freq: Four times a day (QID) | ORAL | Status: DC | PRN
  Administered 2022-08-21 – 2022-08-22 (×2): 650 mg via ORAL
  Filled 2022-08-21 (×2): qty 2

## 2022-08-21 MED ORDER — ONDANSETRON HCL 4 MG PO TABS
4.0000 mg | ORAL_TABLET | Freq: Four times a day (QID) | ORAL | Status: DC | PRN
  Administered 2022-08-21 – 2022-08-22 (×3): 4 mg via ORAL
  Filled 2022-08-21 (×3): qty 1

## 2022-08-21 MED ORDER — MOMETASONE FURO-FORMOTEROL FUM 200-5 MCG/ACT IN AERO
2.0000 | INHALATION_SPRAY | Freq: Two times a day (BID) | RESPIRATORY_TRACT | Status: DC
  Administered 2022-08-21 – 2022-08-22 (×3): 2 via RESPIRATORY_TRACT
  Filled 2022-08-21 (×2): qty 8.8

## 2022-08-21 MED ORDER — NICOTINE POLACRILEX 2 MG MT GUM
2.0000 mg | CHEWING_GUM | OROMUCOSAL | Status: DC | PRN
  Filled 2022-08-21: qty 1

## 2022-08-21 MED ORDER — OXYCODONE-ACETAMINOPHEN 5-325 MG PO TABS
1.0000 | ORAL_TABLET | ORAL | Status: DC | PRN
  Administered 2022-08-21 – 2022-08-22 (×5): 1 via ORAL
  Filled 2022-08-21 (×5): qty 1

## 2022-08-21 MED ORDER — INSULIN ASPART 100 UNIT/ML IJ SOLN
0.0000 [IU] | INTRAMUSCULAR | Status: DC
  Administered 2022-08-21: 5 [IU] via SUBCUTANEOUS
  Administered 2022-08-21: 3 [IU] via SUBCUTANEOUS
  Administered 2022-08-21: 15 [IU] via SUBCUTANEOUS
  Administered 2022-08-21: 11 [IU] via SUBCUTANEOUS
  Administered 2022-08-21: 3 [IU] via SUBCUTANEOUS
  Administered 2022-08-21 – 2022-08-22 (×2): 5 [IU] via SUBCUTANEOUS
  Administered 2022-08-22: 3 [IU] via SUBCUTANEOUS
  Administered 2022-08-22: 11 [IU] via SUBCUTANEOUS
  Administered 2022-08-22: 3 [IU] via SUBCUTANEOUS
  Administered 2022-08-22: 8 [IU] via SUBCUTANEOUS
  Administered 2022-08-23: 5 [IU] via SUBCUTANEOUS
  Administered 2022-08-23: 8 [IU] via SUBCUTANEOUS
  Administered 2022-08-23: 5 [IU] via SUBCUTANEOUS
  Administered 2022-08-23 (×2): 8 [IU] via SUBCUTANEOUS
  Administered 2022-08-24: 3 [IU] via SUBCUTANEOUS
  Administered 2022-08-24: 2 [IU] via SUBCUTANEOUS

## 2022-08-21 MED ORDER — ALBUTEROL SULFATE (2.5 MG/3ML) 0.083% IN NEBU
2.5000 mg | INHALATION_SOLUTION | RESPIRATORY_TRACT | Status: DC | PRN
  Administered 2022-08-22: 2.5 mg via RESPIRATORY_TRACT
  Filled 2022-08-21: qty 3

## 2022-08-21 MED ORDER — SENNOSIDES-DOCUSATE SODIUM 8.6-50 MG PO TABS: 1.0000 | ORAL_TABLET | Freq: Every evening | ORAL | Status: DC | PRN

## 2022-08-21 MED ORDER — ENOXAPARIN SODIUM 40 MG/0.4ML IJ SOSY
40.0000 mg | PREFILLED_SYRINGE | Freq: Every day | INTRAMUSCULAR | Status: DC
  Administered 2022-08-21 – 2022-08-24 (×4): 40 mg via SUBCUTANEOUS
  Filled 2022-08-21 (×4): qty 0.4

## 2022-08-21 MED ORDER — ACETAMINOPHEN 650 MG RE SUPP: 650.0000 mg | Freq: Four times a day (QID) | RECTAL | Status: DC | PRN

## 2022-08-21 MED ORDER — GUAIFENESIN 100 MG/5ML PO LIQD: 5.0000 mL | ORAL | Status: DC | PRN

## 2022-08-21 MED ORDER — ISOSORBIDE MONONITRATE ER 60 MG PO TB24
60.0000 mg | ORAL_TABLET | Freq: Every day | ORAL | Status: DC
  Administered 2022-08-21 – 2022-08-24 (×4): 60 mg via ORAL
  Filled 2022-08-21 (×4): qty 1

## 2022-08-21 MED ORDER — ATORVASTATIN CALCIUM 10 MG PO TABS
20.0000 mg | ORAL_TABLET | Freq: Every day | ORAL | Status: DC
  Administered 2022-08-21 – 2022-08-24 (×4): 20 mg via ORAL
  Filled 2022-08-21 (×4): qty 2

## 2022-08-21 MED ORDER — INSULIN ASPART 100 UNIT/ML IV SOLN
10.0000 [IU] | Freq: Once | INTRAVENOUS | Status: AC
  Administered 2022-08-21: 10 [IU] via INTRAVENOUS

## 2022-08-21 MED ORDER — METOPROLOL SUCCINATE ER 25 MG PO TB24
25.0000 mg | ORAL_TABLET | Freq: Every day | ORAL | Status: DC
  Administered 2022-08-21 – 2022-08-24 (×4): 25 mg via ORAL
  Filled 2022-08-21 (×4): qty 1

## 2022-08-21 MED ORDER — PREDNISONE 20 MG PO TABS
20.0000 mg | ORAL_TABLET | Freq: Every day | ORAL | Status: DC
  Administered 2022-08-22 – 2022-08-24 (×3): 20 mg via ORAL
  Filled 2022-08-21 (×3): qty 1

## 2022-08-21 MED ORDER — RISPERIDONE 2 MG PO TABS
2.0000 mg | ORAL_TABLET | Freq: Two times a day (BID) | ORAL | Status: DC
  Administered 2022-08-21 – 2022-08-24 (×7): 2 mg via ORAL
  Filled 2022-08-21 (×8): qty 1

## 2022-08-21 NOTE — Progress Notes (Signed)
No charge note  Patient seen and examined this morning, admitted overnight, H&P reviewed and agree with assessment and plan  This is a 52 year old female with history of bipolar, schizophrenia, migraines, CHF, DM2, currently homeless and not taking any medications comes into the hospital with COPD exacerbation.  She was seen in the ED couple days ago, placed on steroids and discharged.  She was still in the waiting area when she was not feeling good, and was found to have hyperglycemia and admitted to the hospital.  States that her breathing is still rough this morning, but better than couple days ago.  Complains of a migraine headache  AKI on CKD 3A -likely in the setting of hyperglycemia.  CBGs much better, continue sliding scale, Lantus.  Continue IV fluids  Poorly controlled diabetes, nonadherent to medications -she is not taking insulin.  These have been resumed and CBGs are better.  Bipolar disorder-continue risperidone  Essential hypertension-continue metoprolol  COPD exacerbation-continue risk, still wheezing this morning  Homelessness-TOC consulted  Scheduled Meds:  atorvastatin  20 mg Oral Daily   enoxaparin (LOVENOX) injection  40 mg Subcutaneous Daily   insulin aspart  0-15 Units Subcutaneous Q4H   insulin aspart  15 Units Subcutaneous Once   insulin glargine-yfgn  15 Units Subcutaneous QHS   isosorbide mononitrate  60 mg Oral Daily   metoprolol succinate  25 mg Oral Daily   mometasone-formoterol  2 puff Inhalation BID   prednisoLONE  20 mg Oral Q breakfast   risperiDONE  2 mg Oral BID   Continuous Infusions:  lactated ringers 100 mL/hr at 08/21/22 0257   PRN Meds:.acetaminophen **OR** acetaminophen, albuterol, guaiFENesin, ondansetron **OR** ondansetron (ZOFRAN) IV, oxyCODONE-acetaminophen, senna-docusate   Lestat Golob M. Cruzita Lederer, MD, PhD [facility]  Between 7 am - 7 pm you can contact me via Amion (for emergencies) or [place] (non urgent matters).  I am  not available 7 pm - 7 am, please contact night coverage MD/APP via Amion

## 2022-08-21 NOTE — H&P (Signed)
PCP:   Patient, No Pcp Per   Chief Complaint:  Hyperglycemia  HPI: This is a homeless 52 year old female with past medical history of bipolar disorder, CHF, diabetes, schizophrenia and migraines.  Patient came to the ER earlier today with complaint of chest pain, workup showed elevated but downtrending troponin, 50=>34.  EKG without acute ischemic changes.  She was treated for COPD exacerbation with steroids.  Per report, patient cleared for discharge however has not left.  Repeat fingerstick blood sugars showed glucose greater than 600.  On BMP was 686.  Patient's creatinine had increased to 1.61 up from 1.09 on labs done earlier that morning.  Per report patient has not taken her diabetic medications, including her insulin since November. Patient gives scant history repeatedly saying, "I am tired''.  Review of Systems:  The patient denies anorexia, fever, weight loss,, vision loss, decreased hearing, hoarseness, chest pain, syncope, dyspnea on exertion, peripheral edema, balance deficits, hemoptysis, abdominal pain, melena, hematochezia, severe indigestion/heartburn, hematuria, incontinence, genital sores, muscle weakness, suspicious skin lesions, transient blindness, difficulty walking, depression, unusual weight change, abnormal bleeding, enlarged lymph nodes, angioedema, and breast masses. Positive: Chest pain, shortness of breath, lethargy,  Past Medical History: Past Medical History:  Diagnosis Date   Anxiety disorder    Asthma    Bipolar affective disorder ([place])    Blood transfusion without reported diagnosis    CHF (congestive heart failure) ([place])    Depression    Diabetes mellitus    Migraine    Schizophrenia ([place])    Past Surgical History:  Procedure Laterality Date   KNEE ARTHROSCOPY     x 2   KNEE SURGERY Left    Incision made and knee cleaned out   TUBAL LIGATION      Medications: Prior to Admission medications   Medication Sig Start Date End Date Taking?  Authorizing Provider  acetaminophen (TYLENOL) 325 MG tablet Take 2 tablets (650 mg total) by mouth every 4 (four) hours as needed for headache or mild pain. 08/06/22   Arrien, Jimmy Picket, MD  albuterol (PROVENTIL) (2.5 MG/3ML) 0.083% nebulizer solution Take 3 mLs (2.5 mg total) by nebulization every 6 (six) hours as needed for shortness of breath. 08/06/22   Arrien, Jimmy Picket, MD  ASPIRIN LOW DOSE 81 MG tablet Take 81 mg by mouth daily. 03/15/22   [provider]  atorvastatin (LIPITOR) 20 MG tablet Take 20 mg by mouth daily. 03/15/22 08/04/22  [provider]  blood glucose meter kit and supplies Dispense based on patient and insurance preference. Use up to four times daily as directed. (FOR ICD-10 E10.9, E11.9). 01/06/18   Eugenie Filler, MD  bumetanide (BUMEX) 2 MG tablet Take 1 tablet (2 mg total) by mouth 2 (two) times daily. 08/06/22 09/05/22  Arrien, Jimmy Picket, MD  fluticasone-salmeterol (ADVAIR DISKUS) 250-50 MCG/ACT AEPB Inhale 1 puff into the lungs in the morning and at bedtime. 08/06/22   Arrien, Jimmy Picket, MD  guaiFENesin (ROBITUSSIN) 100 MG/5ML liquid Take 5 mLs by mouth every 4 (four) hours as needed for cough or to loosen phlegm. 07/07/22   Jeanell Sparrow, DO  insulin glargine (LANTUS SOLOSTAR) 100 UNIT/ML Solostar Pen Inject 20 Units into the skin in the morning. Do not take if blood sugar is <100. 08/14/22   Quintella Reichert, MD  isosorbide mononitrate (IMDUR) 60 MG 24 hr tablet Take 1 tablet (60 mg total) by mouth daily. 08/06/22 09/05/22  Arrien, Jimmy Picket, MD  metoprolol succinate (TOPROL-XL) 25 MG 24  hr tablet Take 1 tablet (25 mg total) by mouth daily. 08/06/22 09/05/22  Arrien, Jimmy Picket, MD  oxyCODONE-acetaminophen (PERCOCET) 10-325 MG tablet Take 1 tablet by mouth every 4 (four) hours as needed for pain.    [provider]  potassium chloride SA (KLOR-CON M) 20 MEQ tablet Take 20 mEq by mouth daily. 01/16/22   [provider]  predniSONE (DELTASONE) 20 MG tablet Take 3 tablets (60 mg total) by mouth daily. 08/20/22   Hayden Rasmussen, MD  risperiDONE (RISPERDAL) 2 MG tablet Take half tablet in the morning and one tablet in the evening. 08/06/22   Arrien, Jimmy Picket, MD  benztropine (COGENTIN) 1 MG tablet Take 1 tablet (1 mg total) by mouth 2 (two) times daily. 12/23/18 06/04/20  Johnn Hai, MD  insulin aspart (NOVOLOG) 100 UNIT/ML injection Inject 0-9 Units into the skin 3 (three) times daily with meals. Patient not taking: Reported on 06/03/2020 12/16/18 06/04/20  Antonieta Pert, MD    Allergies:   Allergies  Allergen Reactions   Tramadol Nausea Only    "feeling like I'm on fire"   Wellbutrin [Bupropion] Other (See Comments)    rage   Nicotine Rash    Patient states she is allergic to the Nicotine patch and they cause her to break out in a rash.  She states she can smoke cigarettes.    Social History:  reports that she has been smoking cigarettes. She has a 29.00 pack-year smoking history. She has never used smokeless tobacco. She reports that she does not currently use alcohol. She reports that she does not use drugs.  Family History: Family History  Problem Relation Age of Onset   Bipolar disorder Mother    Hypertension Father    Diabetes Father     Physical Exam: Vitals:   08/20/22 2311 08/21/22 0000 08/21/22 0001 08/21/22 0003  BP: 110/69 (!) 113/59    Pulse: 97 93    Resp: 16 18    Temp: 98.6 F (37 C)  98.8 F (37.1 C)   TempSrc: Oral  Oral   SpO2: 94% 92%    Weight:    80.3 kg  Height:    '5\' 6"'$  (1.676 m)    General:  Alert and oriented times three, poorly interactive patient by choice Eyes: PERRLA, pink conjunctiva, no scleral icterus ENT: Dry oral mucosa, neck supple, no thyromegaly Lungs: clear to ascultation, no wheeze, no crackles, no use of accessory muscles Cardiovascular: regular rate and rhythm, no regurgitation, no gallops, no murmurs. No carotid bruits, no  JVD Abdomen: soft, positive BS, non-tender, non-distended, no organomegaly, not an acute abdomen GU: not examined Neuro: CN II - XII grossly intact, sensation intact Musculoskeletal: strength 5/5 all extremities, no clubbing, cyanosis or edema Skin: no rash, no subcutaneous crepitation, no decubitus Psych: appropriate patient   Labs on Admission:  Recent Labs    08/20/22 1404 08/20/22 1656 08/20/22 2322  NA 138 136 129*  K 3.1* 3.7 5.0  CL 102  --  89*  CO2 24  --  24  GLUCOSE 268*  --  686*  BUN 25*  --  32*  CREATININE 1.09*  --  1.61*  CALCIUM 7.4*  --  8.5*   Recent Labs    08/20/22 2322  AST 23  ALT 30  ALKPHOS 101  BILITOT 0.4  PROT 6.6  ALBUMIN 3.8   Recent Labs    08/20/22 2322  LIPASE 32   Recent Labs    08/20/22  1404 08/20/22 1656 08/20/22 2321  WBC 5.8  --  7.8  NEUTROABS  --   --  7.3  HGB 10.5* 11.6* 11.6*  HCT 34.6* 34.0* 36.2  MCV 92.5  --  90.0  PLT 192  --  213    Micro Results: Recent Results (from the past 240 hour(s))  Resp panel by RT-PCR (RSV, Flu A&B, Covid) Anterior Nasal Swab     Status: None   Collection Time: 08/14/22  9:43 PM   Specimen: Anterior Nasal Swab  Result Value Ref Range Status   SARS Coronavirus 2 by RT PCR NEGATIVE NEGATIVE Final   Influenza A by PCR NEGATIVE NEGATIVE Final   Influenza B by PCR NEGATIVE NEGATIVE Final    Comment: (NOTE) The Xpert Xpress SARS-CoV-2/FLU/RSV plus assay is intended as an aid in the diagnosis of influenza from Nasopharyngeal swab specimens and should not be used as a sole basis for treatment. Nasal washings and aspirates are unacceptable for Xpert Xpress SARS-CoV-2/FLU/RSV testing.  Fact Sheet for Patients: EntrepreneurPulse.com.au  Fact Sheet for Healthcare Providers: IncredibleEmployment.be  This test is not yet approved or cleared by the [place] FDA and has been authorized for detection and/or diagnosis of SARS-CoV-2 by FDA under  an Emergency Use Authorization (EUA). This EUA will remain in effect (meaning this test can be used) for the duration of the COVID-19 declaration under Section 564(b)(1) of the Act, 21 U.S.C. section 360bbb-3(b)(1), unless the authorization is terminated or revoked.     Resp Syncytial Virus by PCR NEGATIVE NEGATIVE Final    Comment: (NOTE) Fact Sheet for Patients: EntrepreneurPulse.com.au  Fact Sheet for Healthcare Providers: IncredibleEmployment.be  This test is not yet approved or cleared by the [place] FDA and has been authorized for detection and/or diagnosis of SARS-CoV-2 by FDA under an Emergency Use Authorization (EUA). This EUA will remain in effect (meaning this test can be used) for the duration of the COVID-19 declaration under Section 564(b)(1) of the Act, 21 U.S.C. section 360bbb-3(b)(1), unless the authorization is terminated or revoked.  Performed at [facility], [place] [address] [place], [place] [address]   Resp panel by RT-PCR (RSV, Flu A&B, Covid) Anterior Nasal Swab     Status: None   Collection Time: 08/20/22  3:22 PM   Specimen: Anterior Nasal Swab  Result Value Ref Range Status   SARS Coronavirus 2 by RT PCR NEGATIVE NEGATIVE Final   Influenza A by PCR NEGATIVE NEGATIVE Final   Influenza B by PCR NEGATIVE NEGATIVE Final    Comment: (NOTE) The Xpert Xpress SARS-CoV-2/FLU/RSV plus assay is intended as an aid in the diagnosis of influenza from Nasopharyngeal swab specimens and should not be used as a sole basis for treatment. Nasal washings and aspirates are unacceptable for Xpert Xpress SARS-CoV-2/FLU/RSV testing.  Fact Sheet for Patients: EntrepreneurPulse.com.au  Fact Sheet for Healthcare Providers: IncredibleEmployment.be  This test is not yet approved or cleared by the [place] FDA and has been authorized for detection and/or diagnosis of SARS-CoV-2 by FDA  under an Emergency Use Authorization (EUA). This EUA will remain in effect (meaning this test can be used) for the duration of the COVID-19 declaration under Section 564(b)(1) of the Act, 21 U.S.C. section 360bbb-3(b)(1), unless the authorization is terminated or revoked.     Resp Syncytial Virus by PCR NEGATIVE NEGATIVE Final    Comment: (NOTE) Fact Sheet for Patients: EntrepreneurPulse.com.au  Fact Sheet for Healthcare Providers: IncredibleEmployment.be  This test is not yet approved or cleared by  the [employer] and has been authorized for detection and/or diagnosis of SARS-CoV-2 by FDA under an Emergency Use Authorization (EUA). This EUA will remain in effect (meaning this test can be used) for the duration of the COVID-19 declaration under Section 564(b)(1) of the Act, 21 U.S.C. section 360bbb-3(b)(1), unless the authorization is terminated or revoked.  Performed at [facility], [place] [address], [place], [place] [address]      Radiological Exams on Admission: DG Chest 2 View  Result Date: 08/20/2022 CLINICAL DATA:  Patient notes upper chest tightness that has been radiating to her left arm. She notes this started a few days ago, but has happened before due to her Hx CHF. She also notes SOB, dry cough, DM, asthma. EXAM: CHEST - 2 VIEW COMPARISON:  08/19/2022 FINDINGS: Lungs are clear. Mild cardiomegaly. No effusion. Visualized bones unremarkable. IMPRESSION: Mild cardiomegaly.  No acute findings. Electronically Signed   By: Lucrezia Europe M.D.   On: 08/20/2022 14:35   DG Chest Port 1 View  Result Date: 08/19/2022 CLINICAL DATA:  Shortness of breath. EXAM: PORTABLE CHEST 1 VIEW COMPARISON:  PA Lat 08/14/2022 FINDINGS: The heart is moderately enlarged. The mediastinum is normally outlined. There is mild central vascular prominence without findings of acute edema. The lungs are clear of infiltrates. There is no substantial pleural  effusion. Mild chronic elevation right hemidiaphragm. Thoracic spondylosis. IMPRESSION: Moderate cardiomegaly with mild central vascular prominence. No findings of acute edema or airspace consolidation. Electronically Signed   By: Telford Nab M.D.   On: 08/19/2022 05:31    Assessment/Plan Present on Admission:  Acute kidney injury superimposed on chronic kidney disease ([place]) -Likely related to patient's hyperglycemia and polyuria -IV fluid hydration, strict I's and O's, daily weight -BMP in a.m.  Diabetes with hyperglycemia -Sliding scale insulin initiated -Patient will plan to start using subcu daily ordered -Patient received 10 units IV insulin in ER, will give an additional 15 units subcu NovoLog x 1 now.  Repeat fingerstick blood sugars in 1 hour -Pseudohyponatremia secondary to glucose.  Corrected sodium   Bipolar disorder (HCC)-resume patient's risperidone   COPD with acute exacerbation (HCC)-improved, resume patient's Advair Diskus, low-dose p.o. prednisone and nebulizers.  HTN (hypertension)-metoprolol resumed  Of note: Patient has not been taking any of her medications for months prior to this hospitalization.  Loran Fleet 08/21/2022, 1:19 AM

## 2022-08-21 NOTE — ED Provider Notes (Signed)
[facility] Provider Note   CSN: [id number] Arrival date & time: 08/20/22  2145     History  Chief Complaint  Patient presents with   Hyperglycemia    [name] [name] is a 52 y.o. female.  The history is provided by the patient.  Hyperglycemia She has history of diabetes, asthma, heart failure, schizophrenia and was in the ED earlier today with COPD exacerbation.  She was discharged and had a sandwich in the waiting room and felt that her blood sugar had gone up and fingerstick blood sugar was greater than 600.  Of note, she has not been able to get her insulin filled since November, she is also homeless.  She is complaining of some nausea but no vomiting.  She denies fever or chills.  She denies diarrhea.   Home Medications Prior to Admission medications   Medication Sig Start Date End Date Taking? Authorizing Provider  acetaminophen (TYLENOL) 325 MG tablet Take 2 tablets (650 mg total) by mouth every 4 (four) hours as needed for headache or mild pain. 08/06/22   Arrien, Jimmy Picket, MD  albuterol (PROVENTIL) (2.5 MG/3ML) 0.083% nebulizer solution Take 3 mLs (2.5 mg total) by nebulization every 6 (six) hours as needed for shortness of breath. 08/06/22   Arrien, Jimmy Picket, MD  ASPIRIN LOW DOSE 81 MG tablet Take 81 mg by mouth daily. 03/15/22   [provider]  atorvastatin (LIPITOR) 20 MG tablet Take 20 mg by mouth daily. 03/15/22 08/04/22  [provider]  blood glucose meter kit and supplies Dispense based on patient and insurance preference. Use up to four times daily as directed. (FOR ICD-10 E10.9, E11.9). 01/06/18   Eugenie Filler, MD  bumetanide (BUMEX) 2 MG tablet Take 1 tablet (2 mg total) by mouth 2 (two) times daily. 08/06/22 09/05/22  Arrien, Jimmy Picket, MD  fluticasone-salmeterol (ADVAIR DISKUS) 250-50 MCG/ACT AEPB Inhale 1 puff into the lungs in the morning and at bedtime. 08/06/22   Arrien, Jimmy Picket, MD  guaiFENesin (ROBITUSSIN) 100 MG/5ML liquid Take 5 mLs by mouth every 4 (four) hours as needed for cough or to loosen phlegm. 07/07/22   Jeanell Sparrow, DO  insulin glargine (LANTUS SOLOSTAR) 100 UNIT/ML Solostar Pen Inject 20 Units into the skin in the morning. Do not take if blood sugar is <100. 08/14/22   Quintella Reichert, MD  isosorbide mononitrate (IMDUR) 60 MG 24 hr tablet Take 1 tablet (60 mg total) by mouth daily. 08/06/22 09/05/22  Arrien, Jimmy Picket, MD  metoprolol succinate (TOPROL-XL) 25 MG 24 hr tablet Take 1 tablet (25 mg total) by mouth daily. 08/06/22 09/05/22  Arrien, Jimmy Picket, MD  oxyCODONE-acetaminophen (PERCOCET) 10-325 MG tablet Take 1 tablet by mouth every 4 (four) hours as needed for pain.    [provider]  potassium chloride SA (KLOR-CON M) 20 MEQ tablet Take 20 mEq by mouth daily. 01/16/22   [provider]  predniSONE (DELTASONE) 20 MG tablet Take 3 tablets (60 mg total) by mouth daily. 08/20/22   Hayden Rasmussen, MD  risperiDONE (RISPERDAL) 2 MG tablet Take half tablet in the morning and one tablet in the evening. 08/06/22   Arrien, Jimmy Picket, MD  benztropine (COGENTIN) 1 MG tablet Take 1 tablet (1 mg total) by mouth 2 (two) times daily. 12/23/18 06/04/20  Johnn Hai, MD  insulin aspart (NOVOLOG) 100 UNIT/ML injection Inject 0-9 Units into the skin 3 (three) times daily with meals. Patient not  taking: Reported on 06/03/2020 12/16/18 06/04/20  Antonieta Pert, MD      Allergies    Tramadol, Wellbutrin [bupropion], and Nicotine    Review of Systems   Review of Systems  All other systems reviewed and are negative.   Physical Exam Updated Vital Signs BP (!) 113/59   Pulse 93   Temp 98.8 F (37.1 C) (Oral)   Resp 18   Ht '5\' 6"'$  (1.676 m)   Wt 80.3 kg   SpO2 92%   BMI 28.57 kg/m  Physical Exam Vitals and nursing note reviewed.   52 year old female, resting comfortably and in no acute distress. Vital signs are normal. Oxygen  saturation is 92%, which is normal. Head is normocephalic and atraumatic. PERRLA, EOMI. Oropharynx is clear. Neck is nontender and supple without adenopathy or JVD. Back is nontender and there is no CVA tenderness. Lungs are clear without rales, wheezes, or rhonchi. Chest is nontender. Heart has regular rate and rhythm without murmur. Abdomen is soft, flat, nontender Extremities have no cyanosis or edema, full range of motion is present. Skin is warm and dry without rash. Neurologic: Mental status is normal, cranial nerves are intact, moves all extremities equally.  ED Results / Procedures / Treatments   Labs (all labs ordered are listed, but only abnormal results are displayed) Labs Reviewed  COMPREHENSIVE METABOLIC PANEL - Abnormal; Notable for the following components:      Result Value   Sodium 129 (*)    Chloride 89 (*)    Glucose, Bld 686 (*)    BUN 32 (*)    Creatinine, Ser 1.61 (*)    Calcium 8.5 (*)    GFR, Estimated 38 (*)    Anion gap 16 (*)    All other components within normal limits  CBC WITH DIFFERENTIAL/PLATELET - Abnormal; Notable for the following components:   Hemoglobin 11.6 (*)    RDW 15.7 (*)    Lymphs Abs 0.4 (*)    All other components within normal limits  CBG MONITORING, ED - Abnormal; Notable for the following components:   Glucose-Capillary >600 (*)    All other components within normal limits  LIPASE, BLOOD  CBC WITH DIFFERENTIAL/PLATELET  BLOOD GAS, VENOUS  URINALYSIS, ROUTINE W REFLEX MICROSCOPIC   Radiology DG Chest 2 View  Result Date: 08/20/2022 CLINICAL DATA:  Patient notes upper chest tightness that has been radiating to her left arm. She notes this started a few days ago, but has happened before due to her Hx CHF. She also notes SOB, dry cough, DM, asthma. EXAM: CHEST - 2 VIEW COMPARISON:  08/19/2022 FINDINGS: Lungs are clear. Mild cardiomegaly. No effusion. Visualized bones unremarkable. IMPRESSION: Mild cardiomegaly.  No acute  findings. Electronically Signed   By: Lucrezia Europe M.D.   On: 08/20/2022 14:35   DG Chest Port 1 View  Result Date: 08/19/2022 CLINICAL DATA:  Shortness of breath. EXAM: PORTABLE CHEST 1 VIEW COMPARISON:  PA Lat 08/14/2022 FINDINGS: The heart is moderately enlarged. The mediastinum is normally outlined. There is mild central vascular prominence without findings of acute edema. The lungs are clear of infiltrates. There is no substantial pleural effusion. Mild chronic elevation right hemidiaphragm. Thoracic spondylosis. IMPRESSION: Moderate cardiomegaly with mild central vascular prominence. No findings of acute edema or airspace consolidation. Electronically Signed   By: Telford Nab M.D.   On: 08/19/2022 05:31    Procedures Procedures  Cardiac monitor shows normal sinus rhythm, per my interpretation.  Medications Ordered in ED Medications -  No data to display  ED Course/ Medical Decision Making/ A&P             HEART Score: 6                Medical Decision Making Risk OTC drugs. Decision regarding hospitalization.   Hyperglycemia which may be exacerbated by steroid use.  However, rapid change in glucose is of concern.  I have reviewed her recent ED visit, and glucose was 268 at 1404.  I have reviewed her laboratory work and my interpretation is marked hyperglycemia with normal CO2, no evidence of ketoacidosis.  Hyponatremia appropriate to degree of hyperglycemia.  Acute kidney injury with creatinine 1.61 compared with 1.09 earlier today, anemia which has improved compared with this afternoon possibly relating to some degree of dehydration.  Earlier today, respiratory pathogen panel was negative.  I have also ordered IV fluids, IV insulin.  Given extremely labile hyperglycemia and acute kidney injury, I feel she will need to be admitted.  During admission, will need to make arrangements for her to get her insulin as an outpatient.  I have discussed the case with Dr. Claria Dice of [facility], who agrees to admit the patient.  CRITICAL CARE Performed by: Delora Fuel Total critical care time: 40 minutes Critical care time was exclusive of separately billable procedures and treating other patients. Critical care was necessary to treat or prevent imminent or life-threatening deterioration. Critical care was time spent personally by me on the following activities: development of treatment plan with patient and/or surrogate as well as nursing, discussions with consultants, evaluation of patient's response to treatment, examination of patient, obtaining history from patient or surrogate, ordering and performing treatments and interventions, ordering and review of laboratory studies, ordering and review of radiographic studies, pulse oximetry and re-evaluation of patient's condition.  Final Clinical Impression(s) / ED Diagnoses Final diagnoses:  Acute kidney injury (nontraumatic) (HCC)  Hyperglycemia  Normochromic normocytic anemia  Homeless    Rx / DC Orders ED Discharge Orders     None         Delora Fuel, MD [id number] [phone]

## 2022-08-21 NOTE — ED Notes (Addendum)
ED TO INPATIENT HANDOFF REPORT  ED Nurse Name and Phone #: Rock Nephew [phone]  S Name/Age/Gender [name] [name] 52 y.o. female Room/Bed: 035C/035C  Code Status   Code Status: Full Code  Home/SNF/Other Home Patient oriented to: self, place, time, and situation Is this baseline? Yes   Triage Complete: Triage complete  Chief Complaint AKI (acute kidney injury) [facility]) [N17.9]  Triage Note Pt says she has not had her insulin since December 28th. Glucose reading "high" in triage. Pt says she is feeling weak, tired, blurry vision. Pt says she is supposed to be on oxygen "24/7".    Allergies Allergies  Allergen Reactions   Tramadol Nausea Only    "feeling like I'm on fire"   Wellbutrin [Bupropion] Other (See Comments)    rage   Nicotine Rash    Patient states she is allergic to the Nicotine patch and they cause her to break out in a rash.  She states she can smoke cigarettes.    Level of Care/Admitting Diagnosis ED Disposition     ED Disposition  Admit   Condition  --   Comment  Hospital Area: [place] [[id number]]  Level of Care: Med-Surg [16]  May admit patient to [facility] or [facility] if equivalent level of care is available:: Yes  Covid Evaluation: Confirmed COVID Negative  [facility]: [id number]  Admitting Physician: Quintella Baton [[id number]]  Attending Physician: Quintella Baton [id number]  Certification:: I certify this patient will need inpatient services for at least 2 midnights  Estimated Length of Stay: 0          B Medical/Surgery History Past Medical History:  Diagnosis Date   Anxiety disorder    Asthma    Bipolar affective disorder ([place])    Blood transfusion without reported diagnosis    CHF (congestive heart failure) ([place])    Depression    Diabetes mellitus    Migraine    Schizophrenia ([place])    Past Surgical History:  Procedure Laterality Date   KNEE ARTHROSCOPY     x 2   KNEE SURGERY Left     Incision made and knee cleaned out   TUBAL LIGATION       A IV Location/Drains/Wounds Patient Lines/Drains/Airways Status     Active Line/Drains/Airways     Name Placement date Placement time Site Days   Peripheral IV 08/21/22 20 G 1" Anterior;Right Forearm 08/21/22  0024  Forearm  less than 1   Wound / Incision (Open or Dehisced) 07/17/20 Skin tear Buttocks Mid 07/17/20  1928  Buttocks  765            Intake/Output Last 24 hours No intake or output data in the 24 hours ending 08/21/22 0208  Labs/Imaging Results for orders placed or performed during the hospital encounter of 08/20/22 (from the past 48 hour(s))  CBG monitoring, ED     Status: Abnormal   Collection Time: 08/20/22 11:08 PM  Result Value Ref Range   Glucose-Capillary >600 (HH) 70 - 99 mg/dL    Comment: Glucose reference range applies only to samples taken after fasting for at least 8 hours.  CBC with Differential/Platelet     Status: Abnormal   Collection Time: 08/20/22 11:21 PM  Result Value Ref Range   WBC 7.8 4.0 - 10.5 K/uL   RBC 4.02 3.87 - 5.11 MIL/uL   Hemoglobin 11.6 (L) 12.0 - 15.0 g/dL   HCT 36.2 36.0 - 46.0 %   MCV  90.0 80.0 - 100.0 fL   MCH 28.9 26.0 - 34.0 pg   MCHC 32.0 30.0 - 36.0 g/dL   RDW 15.7 (H) 11.5 - 15.5 %   Platelets 213 150 - 400 K/uL   nRBC 0.0 0.0 - 0.2 %   Neutrophils Relative % 93 %   Neutro Abs 7.3 1.7 - 7.7 K/uL   Lymphocytes Relative 5 %   Lymphs Abs 0.4 (L) 0.7 - 4.0 K/uL   Monocytes Relative 1 %   Monocytes Absolute 0.1 0.1 - 1.0 K/uL   Eosinophils Relative 0 %   Eosinophils Absolute 0.0 0.0 - 0.5 K/uL   Basophils Relative 0 %   Basophils Absolute 0.0 0.0 - 0.1 K/uL   Immature Granulocytes 1 %   Abs Immature Granulocytes 0.05 0.00 - 0.07 K/uL    Comment: Performed at [facility] [address] [place], [place] [address]  Comprehensive metabolic panel     Status: Abnormal   Collection Time: 08/20/22 11:22 PM  Result Value Ref Range   Sodium 129 (L)  135 - 145 mmol/L    Comment: DELTA CHECK NOTED   Potassium 5.0 3.5 - 5.1 mmol/L    Comment: DELTA CHECK NOTED   Chloride 89 (L) 98 - 111 mmol/L   CO2 24 22 - 32 mmol/L   Glucose, Bld 686 (HH) 70 - 99 mg/dL    Comment: CRITICAL RESULT CALLED TO, READ BACK BY AND VERIFIED WITH K.Yashica Sterbenz,EMT/P. 0013 08/21/22. LPAIT Glucose reference range applies only to samples taken after fasting for at least 8 hours.    BUN 32 (H) 6 - 20 mg/dL   Creatinine, Ser 1.61 (H) 0.44 - 1.00 mg/dL   Calcium 8.5 (L) 8.9 - 10.3 mg/dL   Total Protein 6.6 6.5 - 8.1 g/dL   Albumin 3.8 3.5 - 5.0 g/dL   AST 23 15 - 41 U/L   ALT 30 0 - 44 U/L   Alkaline Phosphatase 101 38 - 126 U/L   Total Bilirubin 0.4 0.3 - 1.2 mg/dL   GFR, Estimated 38 (L) >60 mL/min    Comment: (NOTE) Calculated using the CKD-EPI Creatinine Equation (2021)    Anion gap 16 (H) 5 - 15    Comment: Performed at [facility], [place] [address], [place], [place] [address]  Lipase, blood     Status: None   Collection Time: 08/20/22 11:22 PM  Result Value Ref Range   Lipase 32 11 - 51 U/L    Comment: Performed at [facility] [address] [place], [place] [address]  CBG monitoring, ED     Status: Abnormal   Collection Time: 08/21/22 12:48 AM  Result Value Ref Range   Glucose-Capillary 592 (HH) 70 - 99 mg/dL    Comment: Glucose reference range applies only to samples taken after fasting for at least 8 hours.   Comment 1 Notify RN   CBG monitoring, ED     Status: Abnormal   Collection Time: 08/21/22  1:13 AM  Result Value Ref Range   Glucose-Capillary 527 (HH) 70 - 99 mg/dL    Comment: Glucose reference range applies only to samples taken after fasting for at least 8 hours.   Comment 1 Notify RN    Comment 2 Document in Chart   CBC     Status: Abnormal   Collection Time: 08/21/22  1:21 AM  Result Value Ref Range   WBC 6.1 4.0 - 10.5 K/uL   RBC 3.84 (L)  3.87 - 5.11 MIL/uL   Hemoglobin 11.0 (L) 12.0 - 15.0 g/dL   HCT 34.4 (L) 36.0 -  46.0 %   MCV 89.6 80.0 - 100.0 fL   MCH 28.6 26.0 - 34.0 pg   MCHC 32.0 30.0 - 36.0 g/dL   RDW 15.6 (H) 11.5 - 15.5 %   Platelets 190 150 - 400 K/uL   nRBC 0.0 0.0 - 0.2 %    Comment: Performed at [facility] [address] [place], [place] [address]  CBG monitoring, ED     Status: Abnormal   Collection Time: 08/21/22  1:46 AM  Result Value Ref Range   Glucose-Capillary 423 (H) 70 - 99 mg/dL    Comment: Glucose reference range applies only to samples taken after fasting for at least 8 hours.   DG Chest 2 View  Result Date: 08/20/2022 CLINICAL DATA:  Patient notes upper chest tightness that has been radiating to her left arm. She notes this started a few days ago, but has happened before due to her Hx CHF. She also notes SOB, dry cough, DM, asthma. EXAM: CHEST - 2 VIEW COMPARISON:  08/19/2022 FINDINGS: Lungs are clear. Mild cardiomegaly. No effusion. Visualized bones unremarkable. IMPRESSION: Mild cardiomegaly.  No acute findings. Electronically Signed   By: Lucrezia Europe M.D.   On: 08/20/2022 14:35   DG Chest Port 1 View  Result Date: 08/19/2022 CLINICAL DATA:  Shortness of breath. EXAM: PORTABLE CHEST 1 VIEW COMPARISON:  PA Lat 08/14/2022 FINDINGS: The heart is moderately enlarged. The mediastinum is normally outlined. There is mild central vascular prominence without findings of acute edema. The lungs are clear of infiltrates. There is no substantial pleural effusion. Mild chronic elevation right hemidiaphragm. Thoracic spondylosis. IMPRESSION: Moderate cardiomegaly with mild central vascular prominence. No findings of acute edema or airspace consolidation. Electronically Signed   By: Telford Nab M.D.   On: 08/19/2022 05:31    Pending Labs Unresulted Labs (From admission, onward)     Start     Ordered   08/28/22 0500  Creatinine, serum  (enoxaparin (LOVENOX)    CrCl >/= 30 ml/min)  Weekly,   R     Comments: while on enoxaparin therapy    08/21/22 0115   08/21/22 [id number]  Basic  metabolic panel  Tomorrow morning,   R        08/21/22 0115   08/20/22 2317  CBC with Differential  Once,   STAT        08/20/22 2316   08/20/22 2317  Blood gas, venous (at [facility] and [facility])  Once,   R        08/20/22 2316   08/20/22 2317  Urinalysis, Routine w reflex microscopic -Urine, Clean Catch  Once,   URGENT       Question:  Specimen Source  Answer:  Urine, Clean Catch   08/20/22 2316            Vitals/Pain Today's Vitals   08/21/22 0001 08/21/22 0003 08/21/22 0145 08/21/22 0148  BP:   (!) 140/72   Pulse:   92   Resp:   19   Temp: 98.8 F (37.1 C)     TempSrc: Oral     SpO2:   97%   Weight:  80.3 kg    Height:  '5\' 6"'$  (1.676 m)    PainSc:    Asleep    Isolation Precautions No active isolations  Medications Medications  insulin aspart (novoLOG) injection 0-15 Units (15  Units Subcutaneous Given 08/21/22 0153)  insulin glargine-yfgn (SEMGLEE) injection 15 Units (has no administration in time range)  mometasone-formoterol (DULERA) 200-5 MCG/ACT inhaler 2 puff (has no administration in time range)  isosorbide mononitrate (IMDUR) 24 hr tablet 60 mg (has no administration in time range)  guaiFENesin (ROBITUSSIN) 100 MG/5ML liquid 5 mL (has no administration in time range)  enoxaparin (LOVENOX) injection 40 mg (has no administration in time range)  acetaminophen (TYLENOL) tablet 650 mg (has no administration in time range)    Or  acetaminophen (TYLENOL) suppository 650 mg (has no administration in time range)  senna-docusate (Senokot-S) tablet 1 tablet (has no administration in time range)  ondansetron (ZOFRAN) tablet 4 mg (has no administration in time range)    Or  ondansetron (ZOFRAN) injection 4 mg (has no administration in time range)  lactated ringers infusion ( Intravenous New Bag/Given 08/21/22 0150)  insulin aspart (novoLOG) injection 15 Units (15 Units Subcutaneous Not Given 08/21/22 0130)  sodium chloride 0.9 % bolus 1,000 mL (0 mLs Intravenous Stopped 08/21/22 0154)   insulin aspart (novoLOG) injection 10 Units (10 Units Intravenous Given 08/21/22 0049)  acetaminophen (TYLENOL) tablet 650 mg (650 mg Oral Given 08/21/22 0059)    Mobility walks     Focused Assessments Hyperglycemia, Q1 CBG checks   R Recommendations: See Admitting Provider Note  Report given to:   Additional Notes: Patient is sleeping, in no distress.

## 2022-08-21 NOTE — Plan of Care (Signed)
Patient AOX4, VSS throughout shift.  Pt forgetful at times.  All meds given on time as ordered.  Standby assist to bathroom.  Pt c/o generalized pain, PRN tylenol given for relieved.  Diminished lungs, IS encouraged.  Pt voided in bathroom.  POC maintained, will continue to monitor.  Problem: Education: Goal: Ability to describe self-care measures that may prevent or decrease complications (Diabetes Survival Skills Education) will improve Outcome: Progressing Goal: Individualized Educational Video(s) Outcome: Progressing   Problem: Coping: Goal: Ability to adjust to condition or change in health will improve Outcome: Progressing   Problem: Fluid Volume: Goal: Ability to maintain a balanced intake and output will improve Outcome: Progressing   Problem: Health Behavior/Discharge Planning: Goal: Ability to identify and utilize available resources and services will improve Outcome: Progressing Goal: Ability to manage health-related needs will improve Outcome: Progressing   Problem: Metabolic: Goal: Ability to maintain appropriate glucose levels will improve Outcome: Progressing   Problem: Nutritional: Goal: Maintenance of adequate nutrition will improve Outcome: Progressing Goal: Progress toward achieving an optimal weight will improve Outcome: Progressing   Problem: Skin Integrity: Goal: Risk for impaired skin integrity will decrease Outcome: Progressing   Problem: Tissue Perfusion: Goal: Adequacy of tissue perfusion will improve Outcome: Progressing   Problem: Education: Goal: Knowledge of General Education information will improve Description: Including pain rating scale, medication(s)/side effects and non-pharmacologic comfort measures Outcome: Progressing   Problem: Health Behavior/Discharge Planning: Goal: Ability to manage health-related needs will improve Outcome: Progressing   Problem: Clinical Measurements: Goal: Ability to maintain clinical measurements within  normal limits will improve Outcome: Progressing Goal: Will remain free from infection Outcome: Progressing Goal: Diagnostic test results will improve Outcome: Progressing Goal: Respiratory complications will improve Outcome: Progressing Goal: Cardiovascular complication will be avoided Outcome: Progressing   Problem: Activity: Goal: Risk for activity intolerance will decrease Outcome: Progressing   Problem: Nutrition: Goal: Adequate nutrition will be maintained Outcome: Progressing   Problem: Coping: Goal: Level of anxiety will decrease Outcome: Progressing   Problem: Elimination: Goal: Will not experience complications related to bowel motility Outcome: Progressing Goal: Will not experience complications related to urinary retention Outcome: Progressing   Problem: Pain Managment: Goal: General experience of comfort will improve Outcome: Progressing   Problem: Safety: Goal: Ability to remain free from injury will improve Outcome: Progressing   Problem: Skin Integrity: Goal: Risk for impaired skin integrity will decrease Outcome: Progressing

## 2022-08-22 DIAGNOSIS — Z5900 Homelessness unspecified: Secondary | ICD-10-CM | POA: Diagnosis not present

## 2022-08-22 DIAGNOSIS — R739 Hyperglycemia, unspecified: Secondary | ICD-10-CM | POA: Diagnosis not present

## 2022-08-22 DIAGNOSIS — N179 Acute kidney failure, unspecified: Secondary | ICD-10-CM | POA: Diagnosis not present

## 2022-08-22 LAB — GLUCOSE, CAPILLARY
Glucose-Capillary: 132 mg/dL — ABNORMAL HIGH (ref 70–99)
Glucose-Capillary: 184 mg/dL — ABNORMAL HIGH (ref 70–99)
Glucose-Capillary: 187 mg/dL — ABNORMAL HIGH (ref 70–99)
Glucose-Capillary: 204 mg/dL — ABNORMAL HIGH (ref 70–99)
Glucose-Capillary: 216 mg/dL — ABNORMAL HIGH (ref 70–99)
Glucose-Capillary: 275 mg/dL — ABNORMAL HIGH (ref 70–99)
Glucose-Capillary: 303 mg/dL — ABNORMAL HIGH (ref 70–99)

## 2022-08-22 LAB — COMPREHENSIVE METABOLIC PANEL
ALT: 18 U/L (ref 0–44)
AST: 11 U/L — ABNORMAL LOW (ref 15–41)
Albumin: 3 g/dL — ABNORMAL LOW (ref 3.5–5.0)
Alkaline Phosphatase: 83 U/L (ref 38–126)
Anion gap: 7 (ref 5–15)
BUN: 26 mg/dL — ABNORMAL HIGH (ref 6–20)
CO2: 26 mmol/L (ref 22–32)
Calcium: 8.2 mg/dL — ABNORMAL LOW (ref 8.9–10.3)
Chloride: 102 mmol/L (ref 98–111)
Creatinine, Ser: 1.14 mg/dL — ABNORMAL HIGH (ref 0.44–1.00)
GFR, Estimated: 58 mL/min — ABNORMAL LOW (ref >60)
Glucose, Bld: 210 mg/dL — ABNORMAL HIGH (ref 70–99)
Potassium: 4.2 mmol/L (ref 3.5–5.1)
Sodium: 135 mmol/L (ref 135–145)
Total Bilirubin: 0.6 mg/dL (ref 0.3–1.2)
Total Protein: 5.4 g/dL — ABNORMAL LOW (ref 6.5–8.1)

## 2022-08-22 LAB — CBC
HCT: 32.2 % — ABNORMAL LOW (ref 36.0–46.0)
Hemoglobin: 10.4 g/dL — ABNORMAL LOW (ref 12.0–15.0)
MCH: 28.9 pg (ref 26.0–34.0)
MCHC: 32.3 g/dL (ref 30.0–36.0)
MCV: 89.4 fL (ref 80.0–100.0)
Platelets: 176 10*3/uL (ref 150–400)
RBC: 3.6 MIL/uL — ABNORMAL LOW (ref 3.87–5.11)
RDW: 15.5 % (ref 11.5–15.5)
WBC: 8 10*3/uL (ref 4.0–10.5)
nRBC: 0 % (ref 0.0–0.2)

## 2022-08-22 LAB — MAGNESIUM: Magnesium: 2 mg/dL (ref 1.7–2.4)

## 2022-08-22 MED ORDER — BUDESONIDE 0.25 MG/2ML IN SUSP
0.2500 mg | Freq: Two times a day (BID) | RESPIRATORY_TRACT | Status: DC
  Administered 2022-08-22 – 2022-08-24 (×5): 0.25 mg via RESPIRATORY_TRACT
  Filled 2022-08-22 (×5): qty 2

## 2022-08-22 MED ORDER — OXYCODONE HCL 5 MG PO TABS
10.0000 mg | ORAL_TABLET | ORAL | Status: DC | PRN
  Administered 2022-08-22 – 2022-08-24 (×9): 10 mg via ORAL
  Filled 2022-08-22 (×9): qty 2

## 2022-08-22 MED ORDER — ARFORMOTEROL TARTRATE 15 MCG/2ML IN NEBU
15.0000 ug | INHALATION_SOLUTION | Freq: Two times a day (BID) | RESPIRATORY_TRACT | Status: DC
  Administered 2022-08-22 – 2022-08-24 (×5): 15 ug via RESPIRATORY_TRACT
  Filled 2022-08-22 (×5): qty 2

## 2022-08-22 NOTE — TOC Initial Note (Signed)
Transition of Care [facility]) - Initial/Assessment Note    Patient Details  Name: [name] [name] MRN: [id number] Date of Birth: [birth year]  Transition of [facility]) CM/SW Contact:    Curlene Labrum, RN Phone Number: 08/22/2022, 3:27 PM  Clinical Narrative:                 CM met with the patient at the bedside regarding TOC needs.  The patient was staying at the [facility] and states that she does not have family to stay with after discharge from the hospital.  I provided the patient with shelter resources and resources for [facility] transportation.  The patient receives SSI check for 900.00 per month and plans to [facility] the [facility] after discharge from the hospital.  The patient declines bus passes at this time.  PC[facility] set up [facility] and is noted in the discharge instructions.  Expected Discharge Plan: Homeless Shelter Barriers to Discharge: Homeless with medical needs   Patient Goals and CMS Choice Patient states their goals for this hospitalization and ongoing recovery are:: To get better CMS Medicare.gov Compare Post Acute Care list provided to:: Patient Choice offered to / list presented to : Patient [place] ownership interest in [facility].provided to:: Patient    Expected Discharge Plan and Services   Discharge Planning Services: CM Consult   Living arrangements for the past 2 months: Homeless Shelter                                      Prior Living Arrangements/Services Living arrangements for the past 2 months: Homeless Shelter Lives with:: Other (Comment) (Homeless) Patient language and need for interpreter reviewed:: Yes Do you feel safe going back to the place where you live?: Yes      Need for Family Participation in Patient Care: Yes (Comment) Care giver support system in place?: Yes (comment)   Criminal Activity/Legal Involvement Pertinent to Current  Situation/Hospitalization: No - Comment as needed  Activities of Daily Living Home Assistive Devices/Equipment: None ADL Screening (condition at time of admission) Patient's cognitive ability adequate to safely complete daily activities?: Yes Is the patient deaf or have difficulty hearing?: No Does the patient have difficulty seeing, even when wearing glasses/contacts?: No Does the patient have difficulty concentrating, remembering, or making decisions?: No Patient able to express need for assistance with ADLs?: Yes Does the patient have difficulty dressing or bathing?: No Independently performs ADLs?: Yes (appropriate for developmental age) Does the patient have difficulty walking or climbing stairs?: No Weakness of Legs: None Weakness of Arms/Hands: None  Permission Sought/Granted   Permission granted to share information with : Yes, Verbal Permission Granted              Emotional Assessment Appearance:: Appears stated age Attitude/Demeanor/Rapport: Gracious Affect (typically observed): Accepting Orientation: : Oriented to Self, Oriented to Place, Oriented to  Time, Oriented to Situation Alcohol / Substance Use: Not Applicable Psych Involvement: No (comment)  Admission diagnosis:  Hyperglycemia [R73.9] Homeless [Z59.00] Acute kidney injury (nontraumatic) (HCC) [N17.9] AKI (acute kidney injury) ([place]) [N17.9] Normochromic normocytic anemia [D64.9] Patient Active Problem List   Diagnosis Date Noted   AKI (acute kidney injury) ([place]) 08/21/2022   Superficial vein thrombosis 08/06/2022   Anxiety 08/05/2022   Heart failure ([place]) 08/04/2022   COPD with acute exacerbation ([place]) 08/04/2022  Acute kidney injury superimposed on chronic kidney disease ([place]) 08/04/2022   Acute on chronic systolic heart failure ([place]) 08/03/2022   Bronchitis 08/03/2022   CHF (congestive heart failure) ([place]) 08/03/2022   Acute on chronic systolic CHF (congestive heart failure) ([place]) 05/15/2022    Acute pyelonephritis 07/17/2020   Homelessness 06/12/2020   Bipolar 1 disorder ([place]) 12/18/2018   Suicide attempt ([place])    Overdose, intentional self-harm, initial encounter ([place]) 12/13/2018   Bipolar I disorder, single manic episode, severe, with psychosis ([place]) 06/09/2018   Bipolar disorder ([place]) 06/09/2018   DKA, type 2 ([place]) 01/06/2018   Diabetic acidosis without coma ([place])    Adjustment disorder with depressed mood 01/04/2018   Oral thrush    Acute lower UTI    DKA, type 2, not at goal [facility]) 01/03/2018   Thrush 01/03/2018   DM (diabetes mellitus), type 2, uncontrolled 06/07/2017   Abscess of groin, right 06/07/2017   Abscess 06/07/2017   Bipolar disorder with moderate depression ([place]) 08/31/2015   Tobacco use disorder 08/28/2015   HTN (hypertension) 08/28/2015   Type 2 diabetes mellitus with hyperlipidemia ([place]) 08/28/2015   Hyponatremia 08/28/2015   T2DM (type 2 diabetes mellitus) ([place]) 10/26/2009   OBESITY 10/26/2009   Essential hypertension 10/26/2009   DISTURBANCE OF SKIN SENSATION 10/26/2009   Migraine headache 03/05/2007   PCP:  Patient, No Pcp Per Pharmacy:   [facility] [address] [place] [address] Phone: [phone] Fax: [phone]  [facility] [place], [place] [address] Phone: [phone] Fax: [phone]  [facility], [address] [place] [address] Phone: [phone] Fax: [phone]  [facility], [place] [address] [facility] [place] [address] Phone: [phone] Fax: [phone]     Social Determinants of Health (SDOH) Social History: [place]: Food Insecurity Present (08/21/2022)  Housing: High Risk (08/21/2022)  Transportation  Needs: Unmet Transportation Needs (08/21/2022)  Utilities: At Risk (08/21/2022)  Alcohol Screen: Low Risk  (12/18/2018)  Depression (PHQ2-9): Medium Risk (07/30/2020)  Financial Resource Strain: Low Risk  (05/16/2022)  Tobacco Use: High Risk (08/21/2022)   SDOH Interventions: Housing Interventions: Other (Comment) (transitions of care ordered)   Readmission Risk Interventions    08/22/2022    3:19 PM 08/06/2022   12:11 PM  Readmission Risk Prevention Plan  Transportation Screening Complete Complete  Medication Review ([place]) Complete Referral to Pharmacy  PCP or Specialist appointment within 3-5 days of discharge Complete   HRI or [place] Complete Complete  SW Recovery Care/Counseling Consult Complete Complete  Palliative Care Screening Not Applicable Not [place] Not Applicable Not Applicable

## 2022-08-22 NOTE — Plan of Care (Signed)
Patient AOX4, VSS throughout shift.  Pt forgetful at times.  All meds given on time as ordered.  Standby assist to bathroom.  Pt c/o generalized pain, PRN percocet given for relief and zofran given for nausea.  Diminished lungs, IS encouraged.  Pt voided in bathroom.  POC maintained, will continue to monitor  Problem: Education: Goal: Ability to describe self-care measures that may prevent or decrease complications (Diabetes Survival Skills Education) will improve Outcome: Progressing Goal: Individualized Educational Video(s) Outcome: Progressing   Problem: Coping: Goal: Ability to adjust to condition or change in health will improve Outcome: Progressing   Problem: Fluid Volume: Goal: Ability to maintain a balanced intake and output will improve Outcome: Progressing   Problem: Health Behavior/Discharge Planning: Goal: Ability to identify and utilize available resources and services will improve Outcome: Progressing Goal: Ability to manage health-related needs will improve Outcome: Progressing   Problem: Metabolic: Goal: Ability to maintain appropriate glucose levels will improve Outcome: Progressing   Problem: Nutritional: Goal: Maintenance of adequate nutrition will improve Outcome: Progressing Goal: Progress toward achieving an optimal weight will improve Outcome: Progressing   Problem: Skin Integrity: Goal: Risk for impaired skin integrity will decrease Outcome: Progressing   Problem: Tissue Perfusion: Goal: Adequacy of tissue perfusion will improve Outcome: Progressing   Problem: Education: Goal: Knowledge of General Education information will improve Description: Including pain rating scale, medication(s)/side effects and non-pharmacologic comfort measures Outcome: Progressing   Problem: Health Behavior/Discharge Planning: Goal: Ability to manage health-related needs will improve Outcome: Progressing   Problem: Clinical Measurements: Goal: Ability to maintain  clinical measurements within normal limits will improve Outcome: Progressing Goal: Will remain free from infection Outcome: Progressing Goal: Diagnostic test results will improve Outcome: Progressing Goal: Respiratory complications will improve Outcome: Progressing Goal: Cardiovascular complication will be avoided Outcome: Progressing   Problem: Activity: Goal: Risk for activity intolerance will decrease Outcome: Progressing   Problem: Nutrition: Goal: Adequate nutrition will be maintained Outcome: Progressing   Problem: Coping: Goal: Level of anxiety will decrease Outcome: Progressing   Problem: Elimination: Goal: Will not experience complications related to bowel motility Outcome: Progressing Goal: Will not experience complications related to urinary retention Outcome: Progressing   Problem: Pain Managment: Goal: General experience of comfort will improve Outcome: Progressing   Problem: Safety: Goal: Ability to remain free from injury will improve Outcome: Progressing   Problem: Skin Integrity: Goal: Risk for impaired skin integrity will decrease Outcome: Progressing

## 2022-08-22 NOTE — Inpatient Diabetes Management (Addendum)
Inpatient Diabetes Program Recommendations  AACE/ADA: New Consensus Statement on Inpatient Glycemic Control (2015)  Target Ranges:  Prepandial:   less than 140 mg/dL      Peak postprandial:   less than 180 mg/dL (1-2 hours)      Critically ill patients:  140 - 180 mg/dL   Lab Results  Component Value Date   GLUCAP 184 (H) 08/22/2022   HGBA1C 8.1 (H) 05/16/2022    Review of Glycemic Control  Latest Reference Range & Units 08/21/22 07:22 08/21/22 11:33 08/21/22 15:37 08/21/22 20:13 08/21/22 23:41 08/22/22 03:42 08/22/22 05:36 08/22/22 07:36  Glucose-Capillary 70 - 99 mg/dL 194 (H) 191 (H) 301 (H) 246 (H) 181 (H) 204 (H) 216 (H) 184 (H)  (H): Data is abnormally high  Diabetes history: DM2 Outpatient Diabetes medications: Lantus 20 units QHS, Novolog 0-9 units TID Current orders for Inpatient glycemic control: Semglee 15 units QD, Novolog 0-15 units Q4H, Prednisone 20 mg QD  Inpatient Diabetes Program Recommendations:    Novolog 0-15 units TID and 0-5 units QHS as she has a diet order Novolog 2 units TID with meals IF she consumes at least 50% (while on steroids)  Addendum'@14'$ :25:  Spoke with patient at bedside.  She has been homeless since December 28th.  She has been staying at a shelter and occasionally stays at her son's home who has schizophrenia.  She has not taken her insulin since she has been homeless.  She does not check her glucose.  She is worried about carrying pills or insulins with her because she states they will be stolen.  She does not eat regularly due to her current situation.  She gets a little over $900 in SSI monthly and cannot afford housing and medications together.  Asked if she ever thought of a group home; she states they will take all of her  money.  TOC has been consulted for homelessness & no PCP.     Will continue to follow while inpatient.  Thank you, [name] Dixon, MSN, [place] [facility] Coordinator Inpatient Diabetes Program [phone] (team pager from  8a-5p)

## 2022-08-22 NOTE — Progress Notes (Signed)
PROGRESS NOTE  VERBA DEGEN [id number] DOB: 10-04-1970 DOA: 08/20/2022 PCP: Patient, No Pcp Per   LOS: 1 day   Brief Narrative / Interim history: 52 year old female with history of bipolar, schizophrenia, migraines, CHF, DM2, currently homeless and not taking any medications comes into the hospital with COPD exacerbation. She was seen in the ED couple days ago, placed on steroids and discharged. She was still in the waiting area when she was not feeling good, and was found to have hyperglycemia and admitted to the hospital.   Subjective / 24h Interval events: On my initial evaluation she was very tachypneic, wheezing, complaining of shortness of breath.  On reevaluation after breathing treatment she is much more comfortable  Assesement and Plan: Active Problems:   COPD with acute exacerbation ([place])   Acute kidney injury superimposed on chronic kidney disease (HCC)   HTN (hypertension)   Hyponatremia   Bipolar disorder (HCC)   CHF (congestive heart failure) (HCC)  Principal problem COPD exacerbation -very wheezy and tight this morning, quite tachypneic.  Place her nebulizer with budesonide, Brovana, continue DuoNebs.  Continue prednisone.  Reports chronic hypoxic respiratory failure, will do ambulatory oxygen sats.  She is homeless and having oxygen is a bit difficult.  Active problems AKI on CKD 3A -likely in the setting of hyperglycemia.  Creatinine has returned to her baseline this morning.  Does not need more IV fluids   Poorly controlled diabetes, nonadherent to medications -she is not taking insulin.  These have been resumed and CBGs are better.  CBG (last 3)  Recent Labs    08/22/22 0342 08/22/22 0536 08/22/22 0736  GLUCAP 204* 216* 184*    Bipolar disorder-continue risperidone   Essential hypertension-continue metoprolol   Homelessness-TOC consulted  Scheduled Meds:  arformoterol  15 mcg Nebulization BID   atorvastatin  20 mg Oral Daily   budesonide  (PULMICORT) nebulizer solution  0.25 mg Nebulization BID   enoxaparin (LOVENOX) injection  40 mg Subcutaneous Daily   insulin aspart  0-15 Units Subcutaneous Q4H   insulin aspart  15 Units Subcutaneous Once   insulin glargine-yfgn  15 Units Subcutaneous QHS   isosorbide mononitrate  60 mg Oral Daily   metoprolol succinate  25 mg Oral Daily   predniSONE  20 mg Oral Q breakfast   risperiDONE  2 mg Oral BID   Continuous Infusions: PRN Meds:.acetaminophen **OR** acetaminophen, albuterol, guaiFENesin, nicotine polacrilex, ondansetron **OR** ondansetron (ZOFRAN) IV, oxyCODONE-acetaminophen, senna-docusate  Current Outpatient Medications  Medication Instructions   acetaminophen (TYLENOL) 650 mg, Oral, Every 4 hours PRN   albuterol (PROVENTIL) 2.5 mg, Nebulization, Every 6 hours PRN   Aspirin Low Dose 81 mg, Daily   atorvastatin (LIPITOR) 20 mg, Oral, Daily   blood glucose meter kit and supplies Dispense based on patient and insurance preference. Use up to four times daily as directed. (FOR ICD-10 E10.9, E11.9).   bumetanide (BUMEX) 2 mg, Oral, 2 times daily   fluticasone-salmeterol (ADVAIR DISKUS) 250-50 MCG/ACT AEPB 1 puff, Inhalation, 2 times daily   guaiFENesin (ROBITUSSIN) 100 MG/5ML liquid 5 mLs, Oral, Every 4 hours PRN   isosorbide mononitrate (IMDUR) 60 mg, Oral, Daily   Lantus SoloStar 20 Units, Subcutaneous, Every morning, Do not take if blood sugar is <100.   metoprolol succinate (TOPROL-XL) 25 mg, Oral, Daily   nitroGLYCERIN (NITROSTAT) 0.4 mg, Sublingual, Every 5 min PRN   oxyCODONE-acetaminophen (PERCOCET) 10-325 MG tablet 1 tablet, Every 4 hours PRN   potassium chloride SA (KLOR-CON M) 20 MEQ tablet 20  mEq, Daily   predniSONE (DELTASONE) 60 mg, Oral, Daily   risperiDONE (RISPERDAL) 2 MG tablet Take half tablet in the morning and one tablet in the evening.    Diet Orders (From admission, onward)     Start     Ordered   08/21/22 0115  Diet heart healthy/carb modified Room  service appropriate? Yes; Fluid consistency: Thin  Diet effective now       Question Answer Comment  Diet-HS Snack? Nothing   Room service appropriate? Yes   Fluid consistency: Thin      08/21/22 0115            DVT prophylaxis: enoxaparin (LOVENOX) injection 40 mg Start: 08/21/22 1000   Lab Results  Component Value Date   PLT 176 08/22/2022      Code Status: Full Code  Family Communication: no family at bedside   Status is: Inpatient  Remains inpatient appropriate because: persistent breathing difficulties  Level of care: Med-Surg  Consultants:  none  Objective: Vitals:   08/22/22 0340 08/22/22 0734 08/22/22 0737 08/22/22 1023  BP: 117/63 126/81    Pulse: 91 89    Resp: 17 15    Temp: 97.9 F (36.6 C) 97.8 F (36.6 C)    TempSrc:  Oral    SpO2: 96% 97% 97% 97%  Weight:      Height:        Intake/Output Summary (Last 24 hours) at 08/22/2022 1119 Last data filed at 08/21/2022 2100 Gross per 24 hour  Intake 1692.3 ml  Output --  Net 1692.3 ml   Wt Readings from Last 3 Encounters:  08/21/22 80.3 kg  08/20/22 80.3 kg  08/19/22 80.3 kg    Examination:  Constitutional: NAD Eyes: lids and conjunctivae normal, no scleral icterus ENMT: mmm Neck: normal, supple Respiratory: Diffuse end expiratory bilateral wheezing, tachypneic initially, after breathing treatments less wheezing and no longer tachypneic Cardiovascular: Regular rate and rhythm, no murmurs / rubs / gallops. No LE edema. Abdomen: soft, no distention, no tenderness. Bowel sounds positive.    Data Reviewed: I have independently reviewed following labs and imaging studies   CBC Recent Labs  Lab 08/20/22 1404 08/20/22 1656 08/20/22 2321 08/21/22 0121 08/22/22 0331  WBC 5.8  --  7.8 6.1 8.0  HGB 10.5* 11.6* 11.6* 11.0* 10.4*  HCT 34.6* 34.0* 36.2 34.4* 32.2*  PLT 192  --  213 190 176  MCV 92.5  --  90.0 89.6 89.4  MCH 28.1  --  28.9 28.6 28.9  MCHC 30.3  --  32.0 32.0 32.3  RDW  15.9*  --  15.7* 15.6* 15.5  LYMPHSABS  --   --  0.4*  --   --   MONOABS  --   --  0.1  --   --   EOSABS  --   --  0.0  --   --   BASOSABS  --   --  0.0  --   --     Recent Labs  Lab 08/20/22 1404 08/20/22 1656 08/20/22 2322 08/21/22 0121 08/22/22 0331  NA 138 136 129* 134* 135  K 3.1* 3.7 5.0 4.1 4.2  CL 102  --  89* 97* 102  CO2 24  --  '24 24 26  '$ GLUCOSE 268*  --  686* 537* 210*  BUN 25*  --  32* 33* 26*  CREATININE 1.09*  --  1.61* 1.60* 1.14*  CALCIUM 7.4*  --  8.5* 8.1* 8.2*  AST  --   --  23  --  11*  ALT  --   --  30  --  18  ALKPHOS  --   --  101  --  83  BILITOT  --   --  0.4  --  0.6  ALBUMIN  --   --  3.8  --  3.0*  MG  --   --   --   --  2.0  BNP 368.1*  --   --   --   --     ------------------------------------------------------------------------------------------------------------------ No results for input(s): "CHOL", "HDL", "LDLCALC", "TRIG", "CHOLHDL", "LDLDIRECT" in the last 72 hours.  Lab Results  Component Value Date   HGBA1C 8.1 (H) 05/16/2022   ------------------------------------------------------------------------------------------------------------------ No results for input(s): "TSH", "T4TOTAL", "T3FREE", "THYROIDAB" in the last 72 hours.  Invalid input(s): "FREET3"  Cardiac Enzymes No results for input(s): "CKMB", "TROPONINI", "MYOGLOBIN" in the last 168 hours.  Invalid input(s): "CK" ------------------------------------------------------------------------------------------------------------------    Component Value Date/Time   BNP 368.1 (H) 08/20/2022 1404    CBG: Recent Labs  Lab 08/21/22 2013 08/21/22 2341 08/22/22 0342 08/22/22 0536 08/22/22 0736  GLUCAP 246* 181* 204* 216* 184*    Recent Results (from the past 240 hour(s))  Resp panel by RT-PCR (RSV, Flu A&B, Covid) Anterior Nasal Swab     Status: None   Collection Time: 08/14/22  9:43 PM   Specimen: Anterior Nasal Swab  Result Value Ref Range Status   SARS  Coronavirus 2 by RT PCR NEGATIVE NEGATIVE Final   Influenza A by PCR NEGATIVE NEGATIVE Final   Influenza B by PCR NEGATIVE NEGATIVE Final    Comment: (NOTE) The Xpert Xpress SARS-CoV-2/FLU/RSV plus assay is intended as an aid in the diagnosis of influenza from Nasopharyngeal swab specimens and should not be used as a sole basis for treatment. Nasal washings and aspirates are unacceptable for Xpert Xpress SARS-CoV-2/FLU/RSV testing.  Fact Sheet for Patients: EntrepreneurPulse.com.au  Fact Sheet for Healthcare Providers: IncredibleEmployment.be  This test is not yet approved or cleared by the [place] FDA and has been authorized for detection and/or diagnosis of SARS-CoV-2 by FDA under an Emergency Use Authorization (EUA). This EUA will remain in effect (meaning this test can be used) for the duration of the COVID-19 declaration under Section 564(b)(1) of the Act, 21 U.S.C. section 360bbb-3(b)(1), unless the authorization is terminated or revoked.     Resp Syncytial Virus by PCR NEGATIVE NEGATIVE Final    Comment: (NOTE) Fact Sheet for Patients: EntrepreneurPulse.com.au  Fact Sheet for Healthcare Providers: IncredibleEmployment.be  This test is not yet approved or cleared by the [place] FDA and has been authorized for detection and/or diagnosis of SARS-CoV-2 by FDA under an Emergency Use Authorization (EUA). This EUA will remain in effect (meaning this test can be used) for the duration of the COVID-19 declaration under Section 564(b)(1) of the Act, 21 U.S.C. section 360bbb-3(b)(1), unless the authorization is terminated or revoked.  Performed at [facility], [place] [address] [place], [place] [address]   Resp panel by RT-PCR (RSV, Flu A&B, Covid) Anterior Nasal Swab     Status: None   Collection Time: 08/20/22  3:22 PM   Specimen: Anterior Nasal Swab  Result Value Ref Range Status    SARS Coronavirus 2 by RT PCR NEGATIVE NEGATIVE Final   Influenza A by PCR NEGATIVE NEGATIVE Final   Influenza B by PCR NEGATIVE NEGATIVE Final    Comment: (NOTE) The Xpert Xpress SARS-CoV-2/FLU/RSV plus assay is intended as an aid in  the diagnosis of influenza from Nasopharyngeal swab specimens and should not be used as a sole basis for treatment. Nasal washings and aspirates are unacceptable for Xpert Xpress SARS-CoV-2/FLU/RSV testing.  Fact Sheet for Patients: EntrepreneurPulse.com.au  Fact Sheet for Healthcare Providers: IncredibleEmployment.be  This test is not yet approved or cleared by the [place] FDA and has been authorized for detection and/or diagnosis of SARS-CoV-2 by FDA under an Emergency Use Authorization (EUA). This EUA will remain in effect (meaning this test can be used) for the duration of the COVID-19 declaration under Section 564(b)(1) of the Act, 21 U.S.C. section 360bbb-3(b)(1), unless the authorization is terminated or revoked.     Resp Syncytial Virus by PCR NEGATIVE NEGATIVE Final    Comment: (NOTE) Fact Sheet for Patients: EntrepreneurPulse.com.au  Fact Sheet for Healthcare Providers: IncredibleEmployment.be  This test is not yet approved or cleared by the [place] FDA and has been authorized for detection and/or diagnosis of SARS-CoV-2 by FDA under an Emergency Use Authorization (EUA). This EUA will remain in effect (meaning this test can be used) for the duration of the COVID-19 declaration under Section 564(b)(1) of the Act, 21 U.S.C. section 360bbb-3(b)(1), unless the authorization is terminated or revoked.  Performed at [facility], [place] [address], [place], [place] [address]      Radiology Studies: No results found.   Marzetta Board, MD, PhD [facility]  Between 7 am - 7 pm I am available, please contact me via Amion (for emergencies) or  Securechat (non urgent messages)  Between 7 pm - 7 am I am not available, please contact night coverage MD/APP via Amion

## 2022-08-23 ENCOUNTER — Inpatient Hospital Stay (HOSPITAL_COMMUNITY): Payer: Medicaid Other

## 2022-08-23 ENCOUNTER — Other Ambulatory Visit (HOSPITAL_COMMUNITY): Payer: Self-pay

## 2022-08-23 DIAGNOSIS — I5022 Chronic systolic (congestive) heart failure: Secondary | ICD-10-CM

## 2022-08-23 DIAGNOSIS — N179 Acute kidney failure, unspecified: Secondary | ICD-10-CM | POA: Diagnosis not present

## 2022-08-23 DIAGNOSIS — Z5900 Homelessness unspecified: Secondary | ICD-10-CM | POA: Diagnosis not present

## 2022-08-23 DIAGNOSIS — R739 Hyperglycemia, unspecified: Secondary | ICD-10-CM | POA: Diagnosis not present

## 2022-08-23 LAB — BASIC METABOLIC PANEL
Anion gap: 8 (ref 5–15)
BUN: 17 mg/dL (ref 6–20)
CO2: 24 mmol/L (ref 22–32)
Calcium: 8.4 mg/dL — ABNORMAL LOW (ref 8.9–10.3)
Chloride: 104 mmol/L (ref 98–111)
Creatinine, Ser: 0.88 mg/dL (ref 0.44–1.00)
GFR, Estimated: >60 mL/min (ref >60)
Glucose, Bld: 129 mg/dL — ABNORMAL HIGH (ref 70–99)
Potassium: 4.3 mmol/L (ref 3.5–5.1)
Sodium: 136 mmol/L (ref 135–145)

## 2022-08-23 LAB — GLUCOSE, CAPILLARY
Glucose-Capillary: 119 mg/dL — ABNORMAL HIGH (ref 70–99)
Glucose-Capillary: 154 mg/dL — ABNORMAL HIGH (ref 70–99)
Glucose-Capillary: 212 mg/dL — ABNORMAL HIGH (ref 70–99)
Glucose-Capillary: 222 mg/dL — ABNORMAL HIGH (ref 70–99)
Glucose-Capillary: 231 mg/dL — ABNORMAL HIGH (ref 70–99)
Glucose-Capillary: 263 mg/dL — ABNORMAL HIGH (ref 70–99)
Glucose-Capillary: 276 mg/dL — ABNORMAL HIGH (ref 70–99)
Glucose-Capillary: 288 mg/dL — ABNORMAL HIGH (ref 70–99)

## 2022-08-23 LAB — ECHOCARDIOGRAM COMPLETE
AR max vel: 1.33 cm2
AV Area VTI: 1.38 cm2
AV Area mean vel: 1.56 cm2
AV Mean grad: 4 mmHg
AV Peak grad: 11 mmHg
Ao pk vel: 1.66 m/s
Area-P 1/2: 3.35 cm2
Est EF: <20
Height: 66 in
MV M vel: 4.33 m/s
MV Peak grad: 75 mmHg
S' Lateral: 6.6 cm
Weight: 2832.47 oz

## 2022-08-23 MED ORDER — RISPERIDONE 2 MG PO TABS
1.0000 mg | ORAL_TABLET | ORAL | 0 refills | Status: DC
  Filled 2022-08-23: qty 45, fill #0

## 2022-08-23 MED ORDER — ISOSORBIDE MONONITRATE ER 60 MG PO TB24
60.0000 mg | ORAL_TABLET | Freq: Every day | ORAL | 0 refills | Status: DC
  Filled 2022-08-23: qty 30, 30d supply, fill #0

## 2022-08-23 MED ORDER — ASPIRIN LOW DOSE 81 MG PO TBEC
81.0000 mg | DELAYED_RELEASE_TABLET | Freq: Every day | ORAL | 0 refills | Status: DC
  Filled 2022-08-23: qty 30, 30d supply, fill #0

## 2022-08-23 MED ORDER — FLUTICASONE-SALMETEROL 250-50 MCG/ACT IN AEPB
1.0000 | INHALATION_SPRAY | Freq: Two times a day (BID) | RESPIRATORY_TRACT | 0 refills | Status: DC
  Filled 2022-08-23: qty 60, 30d supply, fill #0

## 2022-08-23 MED ORDER — NITROGLYCERIN 0.4 MG SL SUBL
0.4000 mg | SUBLINGUAL_TABLET | SUBLINGUAL | 0 refills | Status: DC | PRN
  Filled 2022-08-23: qty 25, 30d supply, fill #0

## 2022-08-23 MED ORDER — FUROSEMIDE 10 MG/ML IJ SOLN
40.0000 mg | Freq: Once | INTRAMUSCULAR | Status: AC
  Administered 2022-08-23: 40 mg via INTRAVENOUS
  Filled 2022-08-23: qty 4

## 2022-08-23 MED ORDER — ATORVASTATIN CALCIUM 20 MG PO TABS
20.0000 mg | ORAL_TABLET | Freq: Every day | ORAL | 0 refills | Status: DC
  Filled 2022-08-23: qty 30, 30d supply, fill #0

## 2022-08-23 MED ORDER — POTASSIUM CHLORIDE CRYS ER 20 MEQ PO TBCR
20.0000 meq | EXTENDED_RELEASE_TABLET | Freq: Every day | ORAL | 0 refills | Status: DC
  Filled 2022-08-23: qty 30, 30d supply, fill #0

## 2022-08-23 MED ORDER — PREDNISONE 20 MG PO TABS
20.0000 mg | ORAL_TABLET | Freq: Every day | ORAL | 0 refills | Status: DC
  Filled 2022-08-23: qty 3, 3d supply, fill #0

## 2022-08-23 MED ORDER — BUMETANIDE 2 MG PO TABS
2.0000 mg | ORAL_TABLET | Freq: Two times a day (BID) | ORAL | 0 refills | Status: DC
  Filled 2022-08-23: qty 60, 30d supply, fill #0

## 2022-08-23 MED ORDER — LANTUS SOLOSTAR 100 UNIT/ML ~~LOC~~ SOPN
20.0000 [IU] | PEN_INJECTOR | Freq: Every morning | SUBCUTANEOUS | 0 refills | Status: DC
  Filled 2022-08-23: qty 6, 30d supply, fill #0

## 2022-08-23 MED ORDER — OXYCODONE-ACETAMINOPHEN 10-325 MG PO TABS
1.0000 | ORAL_TABLET | ORAL | 0 refills | Status: DC | PRN
  Filled 2022-08-23: qty 15, 4d supply, fill #0

## 2022-08-23 MED ORDER — PERFLUTREN LIPID MICROSPHERE
1.0000 mL | INTRAVENOUS | Status: AC | PRN
  Administered 2022-08-23: 3 mL via INTRAVENOUS

## 2022-08-23 MED ORDER — METOPROLOL SUCCINATE ER 25 MG PO TB24
25.0000 mg | ORAL_TABLET | Freq: Every day | ORAL | 0 refills | Status: DC
  Filled 2022-08-23: qty 30, 30d supply, fill #0

## 2022-08-23 NOTE — Progress Notes (Signed)
2D echo attempted by Marella Chimes. Patient requested sonographer come back later as she was sleeping.

## 2022-08-23 NOTE — Plan of Care (Signed)
Patient AOX4, VSS throughout shift.  Pt forgetful at times.  All meds given on time as ordered.  Standby assist to bathroom.  Pt c/o generalized pain, PRN percocet given for relief and zofran given for nausea.  Diminished lungs, IS encouraged.  Pt voided in bathroom.  POC maintained, will continue to monitor  Problem: Education: Goal: Ability to describe self-care measures that may prevent or decrease complications (Diabetes Survival Skills Education) will improve Outcome: Progressing Goal: Individualized Educational Video(s) Outcome: Progressing   Problem: Coping: Goal: Ability to adjust to condition or change in health will improve Outcome: Progressing   Problem: Fluid Volume: Goal: Ability to maintain a balanced intake and output will improve Outcome: Progressing   Problem: Health Behavior/Discharge Planning: Goal: Ability to identify and utilize available resources and services will improve Outcome: Progressing Goal: Ability to manage health-related needs will improve Outcome: Progressing   Problem: Metabolic: Goal: Ability to maintain appropriate glucose levels will improve Outcome: Progressing   Problem: Nutritional: Goal: Maintenance of adequate nutrition will improve Outcome: Progressing Goal: Progress toward achieving an optimal weight will improve Outcome: Progressing   Problem: Skin Integrity: Goal: Risk for impaired skin integrity will decrease Outcome: Progressing   Problem: Tissue Perfusion: Goal: Adequacy of tissue perfusion will improve Outcome: Progressing   Problem: Education: Goal: Knowledge of General Education information will improve Description: Including pain rating scale, medication(s)/side effects and non-pharmacologic comfort measures Outcome: Progressing   Problem: Health Behavior/Discharge Planning: Goal: Ability to manage health-related needs will improve Outcome: Progressing   Problem: Clinical Measurements: Goal: Ability to maintain  clinical measurements within normal limits will improve Outcome: Progressing Goal: Will remain free from infection Outcome: Progressing Goal: Diagnostic test results will improve Outcome: Progressing Goal: Respiratory complications will improve Outcome: Progressing Goal: Cardiovascular complication will be avoided Outcome: Progressing   Problem: Activity: Goal: Risk for activity intolerance will decrease Outcome: Progressing   Problem: Nutrition: Goal: Adequate nutrition will be maintained Outcome: Progressing   Problem: Coping: Goal: Level of anxiety will decrease Outcome: Progressing   Problem: Elimination: Goal: Will not experience complications related to bowel motility Outcome: Progressing Goal: Will not experience complications related to urinary retention Outcome: Progressing   Problem: Pain Managment: Goal: General experience of comfort will improve Outcome: Progressing   Problem: Safety: Goal: Ability to remain free from injury will improve Outcome: Progressing   Problem: Skin Integrity: Goal: Risk for impaired skin integrity will decrease Outcome: Progressing   

## 2022-08-23 NOTE — Progress Notes (Addendum)
PROGRESS NOTE  [name] [name] [id number] DOB: 02-Sep-1970 DOA: 08/20/2022 PCP: Patient, No Pcp Per   LOS: 2 days   Brief Narrative / Interim history: 52 year old female with history of bipolar, schizophrenia, migraines, CHF, DM2, currently homeless and not taking any medications comes into the hospital with COPD exacerbation. She was seen in the ED couple days ago, placed on steroids and discharged. She was still in the waiting area when she was not feeling good, and was found to have hyperglycemia and admitted to the hospital.   Subjective / 24h Interval events: Complains of persistent significant dyspnea with minimal exertion.  Assesement and Plan: Active Problems:   COPD with acute exacerbation ([place])   Acute kidney injury superimposed on chronic kidney disease (HCC)   HTN (hypertension)   Hyponatremia   Bipolar disorder (HCC)   CHF (congestive heart failure) (HCC)  Principal problem COPD exacerbation -v wheezing has improved overall, however is still dyspneic with minimal activities.  For now continue budesonide, Brovana, DuoNebs.  Continue prednisone.  Reports chronic hypoxic respiratory failure, will do ambulatory oxygen sats.  She is homeless and having oxygen is a bit difficult.  Active problems Chronic systolic CHF -quite severe, most recent 2D echo done in December 2023 shows an LVEF less than 20%, global hypokinesis.  She also had grade 2 diastolic dysfunction.  Although clinically she does not look overtly fluid overloaded, has some trace lower extremity edema and feels abdominal distention.  Chest x-ray did not show any pulmonary edema.  Will dose Lasix x 1 today and monitor response.  Update a 2D echo  AKI on CKD 3A -likely in the setting of hyperglycemia.  Creatinine has returned to her baseline this morning.  Now receiving Lasix   Poorly controlled diabetes, nonadherent to medications -she is not taking insulin.  These have been resumed and CBGs are better.  CBG  (last 3)  Recent Labs    08/23/22 0414 08/23/22 0551 08/23/22 0745  GLUCAP 231* 212* 222*     Bipolar disorder-continue risperidone   Essential hypertension-continue metoprolol   Homelessness-TOC consulted  Scheduled Meds:  arformoterol  15 mcg Nebulization BID   atorvastatin  20 mg Oral Daily   budesonide (PULMICORT) nebulizer solution  0.25 mg Nebulization BID   enoxaparin (LOVENOX) injection  40 mg Subcutaneous Daily   insulin aspart  0-15 Units Subcutaneous Q4H   insulin aspart  15 Units Subcutaneous Once   insulin glargine-yfgn  15 Units Subcutaneous QHS   isosorbide mononitrate  60 mg Oral Daily   metoprolol succinate  25 mg Oral Daily   predniSONE  20 mg Oral Q breakfast   risperiDONE  2 mg Oral BID   Continuous Infusions: PRN Meds:.acetaminophen **OR** acetaminophen, albuterol, guaiFENesin, nicotine polacrilex, ondansetron **OR** ondansetron (ZOFRAN) IV, oxyCODONE, senna-docusate  Current Outpatient Medications  Medication Instructions   acetaminophen (TYLENOL) 650 mg, Oral, Every 4 hours PRN   albuterol (PROVENTIL) 2.5 mg, Nebulization, Every 6 hours PRN   Aspirin Low Dose 81 mg, Daily   atorvastatin (LIPITOR) 20 mg, Oral, Daily   blood glucose meter kit and supplies Dispense based on patient and insurance preference. Use up to four times daily as directed. (FOR ICD-10 E10.9, E11.9).   bumetanide (BUMEX) 2 mg, Oral, 2 times daily   fluticasone-salmeterol (ADVAIR DISKUS) 250-50 MCG/ACT AEPB 1 puff, Inhalation, 2 times daily   guaiFENesin (ROBITUSSIN) 100 MG/5ML liquid 5 mLs, Oral, Every 4 hours PRN   isosorbide mononitrate (IMDUR) 60 mg, Oral, Daily   Lantus SoloStar  20 Units, Subcutaneous, Every morning, Do not take if blood sugar is <100.   metoprolol succinate (TOPROL-XL) 25 mg, Oral, Daily   nitroGLYCERIN (NITROSTAT) 0.4 mg, Sublingual, Every 5 min PRN   oxyCODONE-acetaminophen (PERCOCET) 10-325 MG tablet 1 tablet, Every 4 hours PRN   potassium chloride SA  (KLOR-CON M) 20 MEQ tablet 20 mEq, Daily   predniSONE (DELTASONE) 60 mg, Oral, Daily   risperiDONE (RISPERDAL) 2 MG tablet Take half tablet in the morning and one tablet in the evening.    Diet Orders (From admission, onward)     Start     Ordered   08/21/22 0115  Diet heart healthy/carb modified Room service appropriate? Yes; Fluid consistency: Thin  Diet effective now       Question Answer Comment  Diet-HS Snack? Nothing   Room service appropriate? Yes   Fluid consistency: Thin      08/21/22 0115            DVT prophylaxis: enoxaparin (LOVENOX) injection 40 mg Start: 08/21/22 1000   Lab Results  Component Value Date   PLT 176 08/22/2022      Code Status: Full Code  Family Communication: no family at bedside   Status is: Inpatient  Remains inpatient appropriate because: persistent breathing difficulties  Level of care: Med-Surg  Consultants:  none  Objective: Vitals:   08/22/22 2057 08/23/22 0412 08/23/22 0742 08/23/22 0748  BP: 135/69 (!) 108/59 116/61   Pulse: 92 100 98 98  Resp: 16 17 18 18   Temp: 97.8 F (36.6 C) 98.8 F (37.1 C) (!) 97.5 F (36.4 C)   TempSrc:   Oral   SpO2: 98% (!) 89% 94% 94%  Weight:      Height:       No intake or output data in the 24 hours ending 08/23/22 1102  Wt Readings from Last 3 Encounters:  08/21/22 80.3 kg  08/20/22 80.3 kg  08/19/22 80.3 kg    Examination:  Constitutional: NAD Eyes: lids and conjunctivae normal, no scleral icterus ENMT: mmm Neck: normal, supple Respiratory: clear to auscultation bilaterally, no wheezing, no crackles. Normal respiratory effort.  Cardiovascular: Regular rate and rhythm, no murmurs / rubs / gallops. Trace edema Abdomen: soft, no distention, no tenderness. Bowel sounds positive.  Skin: no rashes Neurologic: no focal deficits, equal strength   Data Reviewed: I have independently reviewed following labs and imaging studies   CBC Recent Labs  Lab 08/20/22 1404  08/20/22 1656 08/20/22 2321 08/21/22 0121 08/22/22 0331  WBC 5.8  --  7.8 6.1 8.0  HGB 10.5* 11.6* 11.6* 11.0* 10.4*  HCT 34.6* 34.0* 36.2 34.4* 32.2*  PLT 192  --  213 190 176  MCV 92.5  --  90.0 89.6 89.4  MCH 28.1  --  28.9 28.6 28.9  MCHC 30.3  --  32.0 32.0 32.3  RDW 15.9*  --  15.7* 15.6* 15.5  LYMPHSABS  --   --  0.4*  --   --   MONOABS  --   --  0.1  --   --   EOSABS  --   --  0.0  --   --   BASOSABS  --   --  0.0  --   --      Recent Labs  Lab 08/20/22 1404 08/20/22 1656 08/20/22 2322 08/21/22 0121 08/22/22 0331  NA 138 136 129* 134* 135  K 3.1* 3.7 5.0 4.1 4.2  CL 102  --  89* 97* 102  CO2 24  --  24 24 26   GLUCOSE 268*  --  686* 537* 210*  BUN 25*  --  32* 33* 26*  CREATININE 1.09*  --  1.61* 1.60* 1.14*  CALCIUM 7.4*  --  8.5* 8.1* 8.2*  AST  --   --  23  --  11*  ALT  --   --  30  --  18  ALKPHOS  --   --  101  --  83  BILITOT  --   --  0.4  --  0.6  ALBUMIN  --   --  3.8  --  3.0*  MG  --   --   --   --  2.0  BNP 368.1*  --   --   --   --      ------------------------------------------------------------------------------------------------------------------ No results for input(s): "CHOL", "HDL", "LDLCALC", "TRIG", "CHOLHDL", "LDLDIRECT" in the last 72 hours.  Lab Results  Component Value Date   HGBA1C 8.1 (H) 05/16/2022   ------------------------------------------------------------------------------------------------------------------ No results for input(s): "TSH", "T4TOTAL", "T3FREE", "THYROIDAB" in the last 72 hours.  Invalid input(s): "FREET3"  Cardiac Enzymes No results for input(s): "CKMB", "TROPONINI", "MYOGLOBIN" in the last 168 hours.  Invalid input(s): "CK" ------------------------------------------------------------------------------------------------------------------    Component Value Date/Time   BNP 368.1 (H) 08/20/2022 1404    CBG: Recent Labs  Lab 08/22/22 2357 08/23/22 0030 08/23/22 0414 08/23/22 0551  08/23/22 0745  GLUCAP 132* 119* 231* 212* 222*     Recent Results (from the past 240 hour(s))  Resp panel by RT-PCR (RSV, Flu A&B, Covid) Anterior Nasal Swab     Status: None   Collection Time: 08/14/22  9:43 PM   Specimen: Anterior Nasal Swab  Result Value Ref Range Status   SARS Coronavirus 2 by RT PCR NEGATIVE NEGATIVE Final   Influenza A by PCR NEGATIVE NEGATIVE Final   Influenza B by PCR NEGATIVE NEGATIVE Final    Comment: (NOTE) The Xpert Xpress SARS-CoV-2/FLU/RSV plus assay is intended as an aid in the diagnosis of influenza from Nasopharyngeal swab specimens and should not be used as a sole basis for treatment. Nasal washings and aspirates are unacceptable for Xpert Xpress SARS-CoV-2/FLU/RSV testing.  Fact Sheet for Patients: EntrepreneurPulse.com.au  Fact Sheet for Healthcare Providers: IncredibleEmployment.be  This test is not yet approved or cleared by the [place] FDA and has been authorized for detection and/or diagnosis of SARS-CoV-2 by FDA under an Emergency Use Authorization (EUA). This EUA will remain in effect (meaning this test can be used) for the duration of the COVID-19 declaration under Section 564(b)(1) of the Act, 21 U.S.C. section 360bbb-3(b)(1), unless the authorization is terminated or revoked.     Resp Syncytial Virus by PCR NEGATIVE NEGATIVE Final    Comment: (NOTE) Fact Sheet for Patients: EntrepreneurPulse.com.au  Fact Sheet for Healthcare Providers: IncredibleEmployment.be  This test is not yet approved or cleared by the [place] FDA and has been authorized for detection and/or diagnosis of SARS-CoV-2 by FDA under an Emergency Use Authorization (EUA). This EUA will remain in effect (meaning this test can be used) for the duration of the COVID-19 declaration under Section 564(b)(1) of the Act, 21 U.S.C. section 360bbb-3(b)(1), unless the authorization is  terminated or revoked.  Performed at [facility], [place] [address], [place], [place] [address]   Resp panel by RT-PCR (RSV, Flu A&B, Covid) Anterior Nasal Swab     Status: None   Collection Time: 08/20/22  3:22 PM   Specimen: Anterior  Nasal Swab  Result Value Ref Range Status   SARS Coronavirus 2 by RT PCR NEGATIVE NEGATIVE Final   Influenza A by PCR NEGATIVE NEGATIVE Final   Influenza B by PCR NEGATIVE NEGATIVE Final    Comment: (NOTE) The Xpert Xpress SARS-CoV-2/FLU/RSV plus assay is intended as an aid in the diagnosis of influenza from Nasopharyngeal swab specimens and should not be used as a sole basis for treatment. Nasal washings and aspirates are unacceptable for Xpert Xpress SARS-CoV-2/FLU/RSV testing.  Fact Sheet for Patients: EntrepreneurPulse.com.au  Fact Sheet for Healthcare Providers: IncredibleEmployment.be  This test is not yet approved or cleared by the [place] FDA and has been authorized for detection and/or diagnosis of SARS-CoV-2 by FDA under an Emergency Use Authorization (EUA). This EUA will remain in effect (meaning this test can be used) for the duration of the COVID-19 declaration under Section 564(b)(1) of the Act, 21 U.S.C. section 360bbb-3(b)(1), unless the authorization is terminated or revoked.     Resp Syncytial Virus by PCR NEGATIVE NEGATIVE Final    Comment: (NOTE) Fact Sheet for Patients: EntrepreneurPulse.com.au  Fact Sheet for Healthcare Providers: IncredibleEmployment.be  This test is not yet approved or cleared by the [place] FDA and has been authorized for detection and/or diagnosis of SARS-CoV-2 by FDA under an Emergency Use Authorization (EUA). This EUA will remain in effect (meaning this test can be used) for the duration of the COVID-19 declaration under Section 564(b)(1) of the Act, 21 U.S.C. section 360bbb-3(b)(1), unless the authorization  is terminated or revoked.  Performed at [facility], [place] [address] [place], [place] [address]      Radiology Studies: No results found.   Marzetta Board, MD, PhD [facility]  Between 7 am - 7 pm I am available, please contact me via Amion (for emergencies) or Securechat (non urgent messages)  Between 7 pm - 7 am I am not available, please contact night coverage MD/APP via Amion

## 2022-08-23 NOTE — TOC Transition Note (Signed)
Discharge medications (8) are being stored in the main pharmacy on the ground floor until patient is ready for discharge.   

## 2022-08-23 NOTE — Progress Notes (Signed)
  Echocardiogram 2D Echocardiogram has been performed.  [name] [name] Don 08/23/2022, 3:46 PM

## 2022-08-23 NOTE — Plan of Care (Signed)
  Problem: Education: Goal: Ability to describe self-care measures that may prevent or decrease complications (Diabetes Survival Skills Education) will improve 08/23/2022 1813 by Sabino Niemann, RN Outcome: Progressing 08/23/2022 1813 by Sabino Niemann, RN Outcome: Progressing Goal: Individualized Educational Video(s) 08/23/2022 1813 by Sabino Niemann, RN Outcome: Progressing 08/23/2022 1813 by Sabino Niemann, RN Outcome: Progressing   Problem: Coping: Goal: Ability to adjust to condition or change in health will improve 08/23/2022 1813 by Sabino Niemann, RN Outcome: Progressing 08/23/2022 1813 by Sabino Niemann, RN Outcome: Progressing   Problem: Fluid Volume: Goal: Ability to maintain a balanced intake and output will improve 08/23/2022 1813 by Sabino Niemann, RN Outcome: Progressing 08/23/2022 1813 by Sabino Niemann, RN Outcome: Progressing   Problem: Health Behavior/Discharge Planning: Goal: Ability to identify and utilize available resources and services will improve 08/23/2022 1813 by Sabino Niemann, RN Outcome: Progressing 08/23/2022 1813 by Sabino Niemann, RN Outcome: Progressing Goal: Ability to manage health-related needs will improve 08/23/2022 1813 by Sabino Niemann, RN Outcome: Progressing 08/23/2022 1813 by Sabino Niemann, RN Outcome: Progressing   Problem: Metabolic: Goal: Ability to maintain appropriate glucose levels will improve 08/23/2022 1813 by Sabino Niemann, RN Outcome: Progressing 08/23/2022 1813 by Sabino Niemann, RN Outcome: Progressing   Problem: Nutritional: Goal: Maintenance of adequate nutrition will improve 08/23/2022 1813 by Sabino Niemann, RN Outcome: Progressing 08/23/2022 1813 by Sabino Niemann, RN Outcome: Progressing Goal: Progress toward achieving an optimal weight will improve 08/23/2022 1813 by Sabino Niemann, RN Outcome: Progressing 08/23/2022 1813 by Sabino Niemann, RN Outcome: Progressing   Problem: Skin  Integrity: Goal: Risk for impaired skin integrity will decrease 08/23/2022 1813 by Sabino Niemann, RN Outcome: Progressing 08/23/2022 1813 by Sabino Niemann, RN Outcome: Progressing   Problem: Tissue Perfusion: Goal: Adequacy of tissue perfusion will improve 08/23/2022 1813 by Sabino Niemann, RN Outcome: Progressing 08/23/2022 1813 by Sabino Niemann, RN Outcome: Progressing   Problem: Education: Goal: Knowledge of General Education information will improve Description: Including pain rating scale, medication(s)/side effects and non-pharmacologic comfort measures 08/23/2022 1813 by Sabino Niemann, RN Outcome: Progressing 08/23/2022 1813 by Sabino Niemann, RN Outcome: Progressing   Problem: Health Behavior/Discharge Planning: Goal: Ability to manage health-related needs will improve 08/23/2022 1813 by Sabino Niemann, RN Outcome: Progressing 08/23/2022 1813 by Sabino Niemann, RN Outcome: Progressing   Problem: Clinical Measurements: Goal: Ability to maintain clinical measurements within normal limits will improve 08/23/2022 1813 by Sabino Niemann, RN Outcome: Progressing 08/23/2022 1813 by Sabino Niemann, RN Outcome: Progressing Goal: Will remain free from infection 08/23/2022 1813 by Sabino Niemann, RN Outcome: Progressing 08/23/2022 1813 by Sabino Niemann, RN Outcome: Progressing Goal: Diagnostic test results will improve Outcome: Progressing Goal: Respiratory complications will improve Outcome: Progressing Goal: Cardiovascular complication will be avoided Outcome: Progressing   Problem: Activity: Goal: Risk for activity intolerance will decrease Outcome: Progressing   Problem: Nutrition: Goal: Adequate nutrition will be maintained Outcome: Progressing   Problem: Coping: Goal: Level of anxiety will decrease Outcome: Progressing   Problem: Elimination: Goal: Will not experience complications related to bowel motility Outcome: Progressing Goal: Will not  experience complications related to urinary retention Outcome: Progressing   Problem: Pain Managment: Goal: General experience of comfort will improve Outcome: Progressing   Problem: Safety: Goal: Ability to remain free from injury will improve Outcome: Progressing   Problem: Skin Integrity: Goal: Risk for impaired skin integrity will decrease Outcome: Progressing

## 2022-08-24 DIAGNOSIS — J441 Chronic obstructive pulmonary disease with (acute) exacerbation: Secondary | ICD-10-CM | POA: Diagnosis not present

## 2022-08-24 DIAGNOSIS — I5022 Chronic systolic (congestive) heart failure: Secondary | ICD-10-CM | POA: Diagnosis not present

## 2022-08-24 LAB — COMPREHENSIVE METABOLIC PANEL
ALT: 18 U/L (ref 0–44)
AST: 8 U/L — ABNORMAL LOW (ref 15–41)
Albumin: 3 g/dL — ABNORMAL LOW (ref 3.5–5.0)
Alkaline Phosphatase: 73 U/L (ref 38–126)
Anion gap: 11 (ref 5–15)
BUN: 23 mg/dL — ABNORMAL HIGH (ref 6–20)
CO2: 27 mmol/L (ref 22–32)
Calcium: 8.5 mg/dL — ABNORMAL LOW (ref 8.9–10.3)
Chloride: 99 mmol/L (ref 98–111)
Creatinine, Ser: 1.03 mg/dL — ABNORMAL HIGH (ref 0.44–1.00)
GFR, Estimated: >60 mL/min (ref >60)
Glucose, Bld: 130 mg/dL — ABNORMAL HIGH (ref 70–99)
Potassium: 3.8 mmol/L (ref 3.5–5.1)
Sodium: 137 mmol/L (ref 135–145)
Total Bilirubin: 0.6 mg/dL (ref 0.3–1.2)
Total Protein: 5.5 g/dL — ABNORMAL LOW (ref 6.5–8.1)

## 2022-08-24 LAB — CBC
HCT: 34.3 % — ABNORMAL LOW (ref 36.0–46.0)
Hemoglobin: 11.1 g/dL — ABNORMAL LOW (ref 12.0–15.0)
MCH: 28.7 pg (ref 26.0–34.0)
MCHC: 32.4 g/dL (ref 30.0–36.0)
MCV: 88.6 fL (ref 80.0–100.0)
Platelets: 165 10*3/uL (ref 150–400)
RBC: 3.87 MIL/uL (ref 3.87–5.11)
RDW: 15.5 % (ref 11.5–15.5)
WBC: 7.1 10*3/uL (ref 4.0–10.5)
nRBC: 0.3 % — ABNORMAL HIGH (ref 0.0–0.2)

## 2022-08-24 LAB — GLUCOSE, CAPILLARY
Glucose-Capillary: 156 mg/dL — ABNORMAL HIGH (ref 70–99)
Glucose-Capillary: 112 mg/dL — ABNORMAL HIGH (ref 70–99)
Glucose-Capillary: 140 mg/dL — ABNORMAL HIGH (ref 70–99)
Glucose-Capillary: 182 mg/dL — ABNORMAL HIGH (ref 70–99)

## 2022-08-24 LAB — MAGNESIUM: Magnesium: 2 mg/dL (ref 1.7–2.4)

## 2022-08-24 MED ORDER — BUMETANIDE 2 MG PO TABS
2.0000 mg | ORAL_TABLET | Freq: Two times a day (BID) | ORAL | Status: DC
  Administered 2022-08-24: 2 mg via ORAL
  Filled 2022-08-24 (×2): qty 1

## 2022-08-24 MED ORDER — GLUCERNA SHAKE PO LIQD: 237.0000 mL | Freq: Three times a day (TID) | ORAL | Status: DC

## 2022-08-24 NOTE — Plan of Care (Signed)

## 2022-08-24 NOTE — Discharge Summary (Signed)
Physician Discharge Summary  [name] [name] [id number] DOB: Oct 21, 1970 DOA: 08/20/2022  PCP: Patient, No Pcp Per  Admit date: 08/20/2022 Discharge date: 08/24/2022  Admitted From: shelter Disposition:  shelter  Recommendations for Outpatient Follow-up:  Follow up with [facility] as scheduled on 3/25  Home Health: none Equipment/Devices: none  Discharge Condition: stable CODE STATUS: Full code Diet Orders (From admission, onward)     Start     Ordered   08/21/22 0115  Diet heart healthy/carb modified Room service appropriate? Yes; Fluid consistency: Thin  Diet effective now       Question Answer Comment  Diet-HS Snack? Nothing   Room service appropriate? Yes   Fluid consistency: Thin      08/21/22 0115            HPI: Per admitting MD, This is a homeless 52 year old female with past medical history of bipolar disorder, CHF, diabetes, schizophrenia and migraines.  Patient came to the ER earlier today with complaint of chest pain, workup showed elevated but downtrending troponin, 50=>34.  EKG without acute ischemic changes.  She was treated for COPD exacerbation with steroids.  Per report, patient cleared for discharge however has not left.  Repeat fingerstick blood sugars showed glucose greater than 600.  On BMP was 686.  Patient's creatinine had increased to 1.61 up from 1.09 on labs done earlier that morning.  Per report patient has not taken her diabetic medications, including her insulin since November. Patient gives scant history repeatedly saying, "I am tired''.  Hospital Course / Discharge diagnoses: Active Problems:   COPD with acute exacerbation ([place])   Acute kidney injury superimposed on chronic kidney disease (HCC)   HTN (hypertension)   Hyponatremia   Bipolar disorder (HCC)   CHF (congestive heart failure) (HCC)   Principal problem Acute hypoxic respiratory failure due to COPD exacerbation -patient was admitted to the hospital with wheezing, dyspnea.  She was  started on steroids as well as nebulizers.  She was improved with supportive care, also diuresis as below, and has been weaned off to room air.  Currently his wheezing has resolved, she is able to ambulate in the hallway, still with her chronic dyspnea on exertion but feels at baseline.  She will be discharged in stable condition.  Unfortunately her homelessness makes medical care outside hospital difficult, she has follow-up with [facility] in 9 days and she was strongly encouraged to make it to that appointment   Active problems Chronic systolic CHF -she has advanced CHF, GMT is difficult due to her homelessness as well as history of non-adherence to her medical regimen.  At the echocardiogram done during this hospitalization shows an EF of less than 20%, severely decreased LV function with global hypokinesis.  This was similar to the echo in December 2023.  Even though she did not have any significant clinical evidence of fluid overload, was given Lasix due to her DOA with improvement in her respiratory status.  She was placed back on her home Bumex.  All her home medications were refilled at the [facility] and were handed to the patient prior to discharge, and she has a 30-day supply now. AKI on CKD 3A -likely in the setting of hyperglycemic state/dehydration.  With supportive care she has returned to baseline  Poorly controlled diabetes, nonadherent to medications -she is not taking insulin.  Insulin has been resumed in the hospital with good CBG control.  Her home medications were refilled at the [facility] prior to discharge Bipolar disorder-continue  risperidone, she had a 30-day refill upon discharge  Essential hypertension-continue metoprolol, also refilled at discharge  [facility] consulted, lives in a homeless shelter, [facility]  Sepsis ruled out   Discharge Instructions   Allergies as of 08/24/2022       Reactions   Tramadol Nausea Only   "feeling like I'm on fire"   Wellbutrin [bupropion] Other  (See Comments)   rage   Nicotine Rash   Patient states she is allergic to the Nicotine patch and they cause her to break out in a rash.  She states she can smoke cigarettes.        Medication List     STOP taking these medications    acetaminophen 325 MG tablet Commonly known as: TYLENOL   albuterol (2.5 MG/3ML) 0.083% nebulizer solution Commonly known as: PROVENTIL   guaiFENesin 100 MG/5ML liquid Commonly known as: ROBITUSSIN       TAKE these medications    Aspirin Low Dose 81 MG tablet Generic drug: aspirin EC Take 1 tablet (81 mg total) by mouth daily.   atorvastatin 20 MG tablet Commonly known as: LIPITOR Take 1 tablet (20 mg total) by mouth daily.   blood glucose meter kit and supplies Dispense based on patient and insurance preference. Use up to four times daily as directed. (FOR ICD-10 E10.9, E11.9).   bumetanide 2 MG tablet Commonly known as: BUMEX Take 1 tablet (2 mg total) by mouth 2 (two) times daily.   fluticasone-salmeterol 250-50 MCG/ACT Aepb Commonly known as: Advair Diskus Inhale 1 puff into the lungs in the morning and at bedtime.   isosorbide mononitrate 60 MG 24 hr tablet Commonly known as: IMDUR Take 1 tablet (60 mg total) by mouth daily.   Lantus SoloStar 100 UNIT/ML Solostar Pen Generic drug: insulin glargine Inject 20 Units into the skin in the morning. Do not take if blood sugar is <100.   metoprolol succinate 25 MG 24 hr tablet Commonly known as: TOPROL-XL Take 1 tablet (25 mg total) by mouth daily.   nitroGLYCERIN 0.4 MG SL tablet Commonly known as: NITROSTAT Place 1 tablet (0.4 mg total) under the tongue every 5 (five) minutes as needed for chest pain.   oxyCODONE-acetaminophen 10-325 MG tablet Commonly known as: PERCOCET Take 1 tablet by mouth every 4 (four) hours as needed for pain.   potassium chloride SA 20 MEQ tablet Commonly known as: KLOR-CON M Take 1 tablet (20 mEq total) by mouth daily.   predniSONE 20 MG  tablet Commonly known as: DELTASONE Take 1 tablet (20 mg total) by mouth daily with breakfast. What changed:  how much to take when to take this   risperiDONE 2 MG tablet Commonly known as: RISPERDAL Take one-half tablet in the morning and one tablet in the evening. What changed:  how much to take how to take this when to take this additional instructions        Follow-up Information     Riesa Pope, MD Follow up.   Specialty: Internal Medicine Why: You are scheduled for an appointment on 09/02/2022 at 8:45 am. Contact information: [place] [address] [phone]                 Consultations: none  Procedures/Studies:  ECHOCARDIOGRAM COMPLETE  Result Date: 08/23/2022    ECHOCARDIOGRAM REPORT   Patient Name:   MEDIA ADEL Date of Exam: 08/23/2022 Medical Rec #:  [id number]       Height:  66.0 in Accession #:    [id number]      Weight:       177.0 lb Date of Birth:  [birth year]       BSA:          1.899 m Patient Age:    59 years        BP:           116/61 mmHg Patient Gender: F               HR:           95 bpm. Exam Location:  Inpatient Procedure: 2D Echo, Intracardiac Opacification Agent, Color Doppler and Cardiac            Doppler Indications:    CHF  History:        Patient has prior history of Echocardiogram examinations, most                 recent 05/15/2022. CHF, COPD; Risk Factors:Hypertension, Diabetes                 and Current Smoker.  Sonographer:    Marella Chimes Referring Phys: [place]  1. Left ventricular ejection fraction, by estimation, is <20%. The left ventricle has severely decreased function. The left ventricle demonstrates global hypokinesis. The left ventricular internal cavity size was severely dilated. Left ventricular diastolic parameters are indeterminate.  2. Right ventricular systolic function is normal. The right ventricular size is normal. Tricuspid regurgitation signal is  inadequate for assessing PA pressure.  3. Left atrial size was moderately dilated.  4. The mitral valve is normal in structure. Moderate mitral valve regurgitation. No evidence of mitral stenosis.  5. The aortic valve is tricuspid. Aortic valve regurgitation is not visualized. No aortic stenosis is present.  6. The inferior vena cava is dilated in size with >50% respiratory variability, suggesting right atrial pressure of 8 mmHg. Comparison(s): No significant change from prior study. Conclusion(s)/Recommendation(s): No left ventricular mural or apical thrombus/thrombi. FINDINGS  Left Ventricle: Left ventricular ejection fraction, by estimation, is <20%. The left ventricle has severely decreased function. The left ventricle demonstrates global hypokinesis. Definity contrast agent was given IV to delineate the left ventricular endocardial borders. The left ventricular internal cavity size was severely dilated. There is no left ventricular hypertrophy. Left ventricular diastolic parameters are indeterminate. Right Ventricle: The right ventricular size is normal. No increase in right ventricular wall thickness. Right ventricular systolic function is normal. Tricuspid regurgitation signal is inadequate for assessing PA pressure. Left Atrium: Left atrial size was moderately dilated. Right Atrium: Right atrial size was normal in size. Pericardium: There is no evidence of pericardial effusion. Mitral Valve: The mitral valve is normal in structure. Moderate mitral valve regurgitation. No evidence of mitral valve stenosis. Tricuspid Valve: The tricuspid valve is normal in structure. Tricuspid valve regurgitation is trivial. No evidence of tricuspid stenosis. Aortic Valve: The aortic valve is tricuspid. Aortic valve regurgitation is not visualized. No aortic stenosis is present. Aortic valve mean gradient measures 4.0 mmHg. Aortic valve peak gradient measures 11.0 mmHg. Aortic valve area, by VTI measures 1.38  cm. Pulmonic  Valve: The pulmonic valve was not well visualized. Pulmonic valve regurgitation is not visualized. No evidence of pulmonic stenosis. Aorta: The aortic root, ascending aorta, aortic arch and descending aorta are all structurally normal, with no evidence of dilitation or obstruction. Venous: The inferior vena cava is dilated in size with greater than 50% respiratory variability, suggesting  right atrial pressure of 8 mmHg. IAS/Shunts: The atrial septum is grossly normal.  LEFT VENTRICLE PLAX 2D LVIDd:         6.90 cm   Diastology LVIDs:         6.60 cm   LV e' medial:    9.25 cm/s LV PW:         1.20 cm   LV E/e' medial:  10.4 LV IVS:        0.90 cm   LV e' lateral:   11.30 cm/s LVOT diam:     2.20 cm   LV E/e' lateral: 8.5 LV SV:         39 LV SV Index:   20 LVOT Area:     3.80 cm  RIGHT VENTRICLE RV S prime:     10.90 cm/s TAPSE (M-mode): 2.1 cm LEFT ATRIUM              Index        RIGHT ATRIUM           Index LA diam:        5.10 cm  2.69 cm/m   RA Area:     12.20 cm LA Vol (A2C):   106.0 ml 55.83 ml/m  RA Volume:   28.70 ml  15.12 ml/m LA Vol (A4C):   68.5 ml  36.08 ml/m LA Biplane Vol: 89.7 ml  47.24 ml/m  AORTIC VALVE AV Area (Vmax):    1.33 cm AV Area (Vmean):   1.56 cm AV Area (VTI):     1.38 cm AV Vmax:           166.00 cm/s AV Vmean:          93.100 cm/s AV VTI:            0.280 m AV Peak Grad:      11.0 mmHg AV Mean Grad:      4.0 mmHg LVOT Vmax:         58.10 cm/s LVOT Vmean:        38.200 cm/s LVOT VTI:          0.102 m LVOT/AV VTI ratio: 0.36  AORTA Ao Root diam: 3.30 cm Ao Asc diam:  3.20 cm MITRAL VALVE MV Area (PHT): 3.35 cm    SHUNTS MV Decel Time: 227 msec    Systemic VTI:  0.10 m MR Peak grad: 75.0 mmHg    Systemic Diam: 2.20 cm MR Vmax:      433.00 cm/s MV E velocity: 96.55 cm/s MV A velocity: 96.40 cm/s MV E/A ratio:  1.00 Buford Dresser MD Electronically signed by Buford Dresser MD Signature Date/Time: 08/23/2022/4:55:05 PM    Final    DG Chest 2 View  Result Date:  08/20/2022 CLINICAL DATA:  Patient notes upper chest tightness that has been radiating to her left arm. She notes this started a few days ago, but has happened before due to her Hx CHF. She also notes SOB, dry cough, DM, asthma. EXAM: CHEST - 2 VIEW COMPARISON:  08/19/2022 FINDINGS: Lungs are clear. Mild cardiomegaly. No effusion. Visualized bones unremarkable. IMPRESSION: Mild cardiomegaly.  No acute findings. Electronically Signed   By: Lucrezia Europe M.D.   On: 08/20/2022 14:35   DG Chest Port 1 View  Result Date: 08/19/2022 CLINICAL DATA:  Shortness of breath. EXAM: PORTABLE CHEST 1 VIEW COMPARISON:  PA Lat 08/14/2022 FINDINGS: The heart is moderately enlarged. The mediastinum is normally outlined. There is mild central vascular prominence  without findings of acute edema. The lungs are clear of infiltrates. There is no substantial pleural effusion. Mild chronic elevation right hemidiaphragm. Thoracic spondylosis. IMPRESSION: Moderate cardiomegaly with mild central vascular prominence. No findings of acute edema or airspace consolidation. Electronically Signed   By: Telford Nab M.D.   On: 08/19/2022 05:31   DG Knee Complete 4 Views Left  Result Date: 08/14/2022 CLINICAL DATA:  Recent fall with left knee pain, initial encounter EXAM: LEFT KNEE - COMPLETE 4+ VIEW COMPARISON:  None Available. FINDINGS: Tricompartmental degenerative changes are noted. No joint effusion is seen. No acute fracture or dislocation is seen. IMPRESSION: Degenerative change without acute abnormality. Electronically Signed   By: Inez Catalina M.D.   On: 08/14/2022 21:27   DG Chest 2 View  Result Date: 08/14/2022 CLINICAL DATA:  Shortness of breath, cough EXAM: CHEST - 2 VIEW COMPARISON:  08/03/2022 FINDINGS: The heart size and mediastinal contours are within normal limits. Both lungs are clear. The visualized skeletal structures are unremarkable. IMPRESSION: No active cardiopulmonary disease. Electronically Signed   By: Rolm Baptise  M.D.   On: 08/14/2022 03:05   DG Chest Port 1 View  Result Date: 08/03/2022 CLINICAL DATA:  Cough and shortness of breath EXAM: PORTABLE CHEST 1 VIEW COMPARISON:  07/07/2022 and prior studies FINDINGS: This is a low volume study.  Telemetry leads overlie the chest. Cardiomegaly again noted. There is no evidence of focal airspace disease, pulmonary edema, suspicious pulmonary nodule/mass, pleural effusion, or pneumothorax. No acute bony abnormalities are identified. IMPRESSION: Cardiomegaly without evidence of acute cardiopulmonary disease. Electronically Signed   By: Margarette Canada M.D.   On: 08/03/2022 13:29     Subjective: - no chest pain, shortness of breath, no abdominal pain, nausea or vomiting.   Discharge Exam: BP 120/70 (BP Location: Left Arm)   Pulse (!) 102   Temp 98.1 F (36.7 C) (Oral)   Resp 16   Ht 5\' 6"  (1.676 m)   Wt 80.3 kg   SpO2 94%   BMI 28.57 kg/m   General: Pt is alert, awake, not in acute distress Cardiovascular: RRR, S1/S2 +, no rubs, no gallops Respiratory: CTA bilaterally, no wheezing, no rhonchi Abdominal: Soft, NT, ND, bowel sounds + Extremities: no edema, no cyanosis  The results of significant diagnostics from this hospitalization (including imaging, microbiology, ancillary and laboratory) are listed below for reference.     Microbiology: Recent Results (from the past 240 hour(s))  Resp panel by RT-PCR (RSV, Flu A&B, Covid) Anterior Nasal Swab     Status: None   Collection Time: 08/14/22  9:43 PM   Specimen: Anterior Nasal Swab  Result Value Ref Range Status   SARS Coronavirus 2 by RT PCR NEGATIVE NEGATIVE Final   Influenza A by PCR NEGATIVE NEGATIVE Final   Influenza B by PCR NEGATIVE NEGATIVE Final    Comment: (NOTE) The Xpert Xpress SARS-CoV-2/FLU/RSV plus assay is intended as an aid in the diagnosis of influenza from Nasopharyngeal swab specimens and should not be used as a sole basis for treatment. Nasal washings and aspirates are  unacceptable for Xpert Xpress SARS-CoV-2/FLU/RSV testing.  Fact Sheet for Patients: EntrepreneurPulse.com.au  Fact Sheet for Healthcare Providers: IncredibleEmployment.be  This test is not yet approved or cleared by the [place] FDA and has been authorized for detection and/or diagnosis of SARS-CoV-2 by FDA under an Emergency Use Authorization (EUA). This EUA will remain in effect (meaning this test can be used) for the duration of the COVID-19 declaration under Section  564(b)(1) of the Act, 21 U.S.C. section 360bbb-3(b)(1), unless the authorization is terminated or revoked.     Resp Syncytial Virus by PCR NEGATIVE NEGATIVE Final    Comment: (NOTE) Fact Sheet for Patients: EntrepreneurPulse.com.au  Fact Sheet for Healthcare Providers: IncredibleEmployment.be  This test is not yet approved or cleared by the [place] FDA and has been authorized for detection and/or diagnosis of SARS-CoV-2 by FDA under an Emergency Use Authorization (EUA). This EUA will remain in effect (meaning this test can be used) for the duration of the COVID-19 declaration under Section 564(b)(1) of the Act, 21 U.S.C. section 360bbb-3(b)(1), unless the authorization is terminated or revoked.  Performed at [facility], [place] [address] [place], [place] [address]   Resp panel by RT-PCR (RSV, Flu A&B, Covid) Anterior Nasal Swab     Status: None   Collection Time: 08/20/22  3:22 PM   Specimen: Anterior Nasal Swab  Result Value Ref Range Status   SARS Coronavirus 2 by RT PCR NEGATIVE NEGATIVE Final   Influenza A by PCR NEGATIVE NEGATIVE Final   Influenza B by PCR NEGATIVE NEGATIVE Final    Comment: (NOTE) The Xpert Xpress SARS-CoV-2/FLU/RSV plus assay is intended as an aid in the diagnosis of influenza from Nasopharyngeal swab specimens and should not be used as a sole basis for treatment. Nasal washings and aspirates  are unacceptable for Xpert Xpress SARS-CoV-2/FLU/RSV testing.  Fact Sheet for Patients: EntrepreneurPulse.com.au  Fact Sheet for Healthcare Providers: IncredibleEmployment.be  This test is not yet approved or cleared by the [place] FDA and has been authorized for detection and/or diagnosis of SARS-CoV-2 by FDA under an Emergency Use Authorization (EUA). This EUA will remain in effect (meaning this test can be used) for the duration of the COVID-19 declaration under Section 564(b)(1) of the Act, 21 U.S.C. section 360bbb-3(b)(1), unless the authorization is terminated or revoked.     Resp Syncytial Virus by PCR NEGATIVE NEGATIVE Final    Comment: (NOTE) Fact Sheet for Patients: EntrepreneurPulse.com.au  Fact Sheet for Healthcare Providers: IncredibleEmployment.be  This test is not yet approved or cleared by the [place] FDA and has been authorized for detection and/or diagnosis of SARS-CoV-2 by FDA under an Emergency Use Authorization (EUA). This EUA will remain in effect (meaning this test can be used) for the duration of the COVID-19 declaration under Section 564(b)(1) of the Act, 21 U.S.C. section 360bbb-3(b)(1), unless the authorization is terminated or revoked.  Performed at [facility], [place] [address] [place], [place] [address]      Labs: Basic Metabolic Panel: Recent Labs  Lab 08/20/22 2322 08/21/22 0121 08/22/22 0331 08/23/22 1003 08/24/22 0359  NA 129* 134* 135 136 137  K 5.0 4.1 4.2 4.3 3.8  CL 89* 97* 102 104 99  CO2 24 24 26 24 27   GLUCOSE 686* 537* 210* 129* 130*  BUN 32* 33* 26* 17 23*  CREATININE 1.61* 1.60* 1.14* 0.88 1.03*  CALCIUM 8.5* 8.1* 8.2* 8.4* 8.5*  MG  --   --  2.0  --  2.0   Liver Function Tests: Recent Labs  Lab 08/20/22 2322 08/22/22 0331 08/24/22 0359  AST 23 11* 8*  ALT 30 18 18   ALKPHOS 101 83 73  BILITOT 0.4 0.6 0.6  PROT 6.6 5.4*  5.5*  ALBUMIN 3.8 3.0* 3.0*   CBC: Recent Labs  Lab 08/20/22 1404 08/20/22 1656 08/20/22 2321 08/21/22 0121 08/22/22 0331 08/24/22 0359  WBC 5.8  --  7.8 6.1 8.0  7.1  NEUTROABS  --   --  7.3  --   --   --   HGB 10.5* 11.6* 11.6* 11.0* 10.4* 11.1*  HCT 34.6* 34.0* 36.2 34.4* 32.2* 34.3*  MCV 92.5  --  90.0 89.6 89.4 88.6  PLT 192  --  213 190 176 165   CBG: Recent Labs  Lab 08/23/22 1557 08/23/22 2209 08/24/22 0132 08/24/22 0558 08/24/22 0823  GLUCAP 276* 288* 156* 112* 140*   Hgb A1c No results for input(s): "HGBA1C" in the last 72 hours. Lipid Profile No results for input(s): "CHOL", "HDL", "LDLCALC", "TRIG", "CHOLHDL", "LDLDIRECT" in the last 72 hours. Thyroid function studies No results for input(s): "TSH", "T4TOTAL", "T3FREE", "THYROIDAB" in the last 72 hours.  Invalid input(s): "FREET3" Urinalysis    Component Value Date/Time   COLORURINE YELLOW 07/21/2022 [place] 07/21/2022 2223   LABSPEC 1.016 07/21/2022 2223   PHURINE 5.0 07/21/2022 2223   GLUCOSEU 150 (A) 07/21/2022 2223   HGBUR NEGATIVE 07/21/2022 2223   HGBUR trace-intact 11/25/2008 1530   BILIRUBINUR NEGATIVE 07/21/2022 2223   KETONESUR NEGATIVE 07/21/2022 2223   PROTEINUR NEGATIVE 07/21/2022 2223   UROBILINOGEN 0.2 05/12/2014 2315   NITRITE NEGATIVE 07/21/2022 2223   LEUKOCYTESUR TRACE (A) 07/21/2022 2223    FURTHER DISCHARGE INSTRUCTIONS:   Get Medicines reviewed and adjusted: Please take all your medications with you for your next visit with your Primary MD   Laboratory/radiological data: Please request your Primary MD to go over all hospital tests and procedure/radiological results at the follow up, please ask your Primary MD to get all Hospital records sent to his/her office.   In some cases, they will be blood work, cultures and biopsy results pending at the time of your discharge. Please request that your primary care M.D. goes through all the records of your hospital  data and follows up on these results.   Also Note the following: If you experience worsening of your admission symptoms, develop shortness of breath, life threatening emergency, suicidal or homicidal thoughts you must seek medical attention immediately by calling 911 or calling your MD immediately  if symptoms less severe.   You must read complete instructions/literature along with all the possible adverse reactions/side effects for all the Medicines you take and that have been prescribed to you. Take any new Medicines after you have completely understood and accpet all the possible adverse reactions/side effects.    Do not drive when taking Pain medications or sleeping medications (Benzodaizepines)   Do not take more than prescribed Pain, Sleep and Anxiety Medications. It is not advisable to combine anxiety,sleep and pain medications without talking with your primary care practitioner   Special Instructions: If you have smoked or chewed Tobacco  in the last 2 yrs please stop smoking, stop any regular Alcohol  and or any Recreational drug use.   Wear Seat belts while driving.   Please note: You were cared for by a hospitalist during your hospital stay. Once you are discharged, your primary care physician will handle any further medical issues. Please note that NO REFILLS for any discharge medications will be authorized once you are discharged, as it is imperative that you return to your primary care physician (or establish a relationship with a primary care physician if you do not have one) for your post hospital discharge needs so that they can reassess your need for medications and monitor your lab values.  Time coordinating discharge: 40 minutes  SIGNED:  Marzetta Board, MD,  PhD 08/24/2022, 11:37 AM

## 2022-08-26 ENCOUNTER — Emergency Department (HOSPITAL_COMMUNITY): Payer: Medicaid Other

## 2022-08-26 ENCOUNTER — Other Ambulatory Visit: Payer: Self-pay

## 2022-08-26 ENCOUNTER — Inpatient Hospital Stay (HOSPITAL_COMMUNITY)
Admission: EM | Admit: 2022-08-26 | Discharge: 2022-08-29 | DRG: 280 | Disposition: A | Discharge status: Home / Self Care | Payer: Medicaid Other | Attending: Internal Medicine | Admitting: Internal Medicine

## 2022-08-26 DIAGNOSIS — F17210 Nicotine dependence, cigarettes, uncomplicated: Secondary | ICD-10-CM | POA: Diagnosis present

## 2022-08-26 DIAGNOSIS — E1122 Type 2 diabetes mellitus with diabetic chronic kidney disease: Secondary | ICD-10-CM | POA: Diagnosis present

## 2022-08-26 DIAGNOSIS — Z1152 Encounter for screening for COVID-19: Secondary | ICD-10-CM

## 2022-08-26 DIAGNOSIS — Z833 Family history of diabetes mellitus: Secondary | ICD-10-CM

## 2022-08-26 DIAGNOSIS — Z5941 Food insecurity: Secondary | ICD-10-CM

## 2022-08-26 DIAGNOSIS — E785 Hyperlipidemia, unspecified: Secondary | ICD-10-CM | POA: Diagnosis present

## 2022-08-26 DIAGNOSIS — Z79899 Other long term (current) drug therapy: Secondary | ICD-10-CM

## 2022-08-26 DIAGNOSIS — N1831 Chronic kidney disease, stage 3a: Secondary | ICD-10-CM | POA: Diagnosis present

## 2022-08-26 DIAGNOSIS — I428 Other cardiomyopathies: Secondary | ICD-10-CM | POA: Diagnosis present

## 2022-08-26 DIAGNOSIS — I5023 Acute on chronic systolic (congestive) heart failure: Secondary | ICD-10-CM | POA: Diagnosis present

## 2022-08-26 DIAGNOSIS — E860 Dehydration: Secondary | ICD-10-CM | POA: Diagnosis present

## 2022-08-26 DIAGNOSIS — Z7951 Long term (current) use of inhaled steroids: Secondary | ICD-10-CM

## 2022-08-26 DIAGNOSIS — E1169 Type 2 diabetes mellitus with other specified complication: Secondary | ICD-10-CM | POA: Diagnosis present

## 2022-08-26 DIAGNOSIS — I82890 Acute embolism and thrombosis of other specified veins: Secondary | ICD-10-CM | POA: Diagnosis present

## 2022-08-26 DIAGNOSIS — F209 Schizophrenia, unspecified: Secondary | ICD-10-CM | POA: Diagnosis present

## 2022-08-26 DIAGNOSIS — Z794 Long term (current) use of insulin: Secondary | ICD-10-CM

## 2022-08-26 DIAGNOSIS — Z91141 Patient's other noncompliance with medication regimen due to financial hardship: Secondary | ICD-10-CM

## 2022-08-26 DIAGNOSIS — R739 Hyperglycemia, unspecified: Secondary | ICD-10-CM

## 2022-08-26 DIAGNOSIS — Z888 Allergy status to other drugs, medicaments and biological substances status: Secondary | ICD-10-CM

## 2022-08-26 DIAGNOSIS — I130 Hypertensive heart and chronic kidney disease with heart failure and stage 1 through stage 4 chronic kidney disease, or unspecified chronic kidney disease: Secondary | ICD-10-CM | POA: Diagnosis present

## 2022-08-26 DIAGNOSIS — N179 Acute kidney failure, unspecified: Secondary | ICD-10-CM | POA: Diagnosis present

## 2022-08-26 DIAGNOSIS — I340 Nonrheumatic mitral (valve) insufficiency: Secondary | ICD-10-CM | POA: Diagnosis present

## 2022-08-26 DIAGNOSIS — Z5982 Transportation insecurity: Secondary | ICD-10-CM

## 2022-08-26 DIAGNOSIS — F419 Anxiety disorder, unspecified: Secondary | ICD-10-CM | POA: Diagnosis present

## 2022-08-26 DIAGNOSIS — J449 Chronic obstructive pulmonary disease, unspecified: Secondary | ICD-10-CM | POA: Diagnosis present

## 2022-08-26 DIAGNOSIS — I5043 Acute on chronic combined systolic (congestive) and diastolic (congestive) heart failure: Secondary | ICD-10-CM | POA: Diagnosis present

## 2022-08-26 DIAGNOSIS — E871 Hypo-osmolality and hyponatremia: Secondary | ICD-10-CM | POA: Diagnosis present

## 2022-08-26 DIAGNOSIS — G8929 Other chronic pain: Secondary | ICD-10-CM | POA: Diagnosis present

## 2022-08-26 DIAGNOSIS — I5022 Chronic systolic (congestive) heart failure: Secondary | ICD-10-CM | POA: Diagnosis present

## 2022-08-26 DIAGNOSIS — Z5901 Sheltered homelessness: Secondary | ICD-10-CM

## 2022-08-26 DIAGNOSIS — Z7952 Long term (current) use of systemic steroids: Secondary | ICD-10-CM

## 2022-08-26 DIAGNOSIS — Z818 Family history of other mental and behavioral disorders: Secondary | ICD-10-CM

## 2022-08-26 DIAGNOSIS — I959 Hypotension, unspecified: Secondary | ICD-10-CM | POA: Diagnosis present

## 2022-08-26 DIAGNOSIS — E1165 Type 2 diabetes mellitus with hyperglycemia: Secondary | ICD-10-CM | POA: Diagnosis present

## 2022-08-26 DIAGNOSIS — N189 Chronic kidney disease, unspecified: Secondary | ICD-10-CM | POA: Diagnosis present

## 2022-08-26 DIAGNOSIS — E0590 Thyrotoxicosis, unspecified without thyrotoxic crisis or storm: Secondary | ICD-10-CM | POA: Diagnosis present

## 2022-08-26 DIAGNOSIS — Z8249 Family history of ischemic heart disease and other diseases of the circulatory system: Secondary | ICD-10-CM

## 2022-08-26 DIAGNOSIS — Z7982 Long term (current) use of aspirin: Secondary | ICD-10-CM

## 2022-08-26 DIAGNOSIS — I214 Non-ST elevation (NSTEMI) myocardial infarction: Principal | ICD-10-CM | POA: Diagnosis present

## 2022-08-26 LAB — COMPREHENSIVE METABOLIC PANEL
ALT: 27 U/L (ref 0–44)
AST: 26 U/L (ref 15–41)
Albumin: 3.8 g/dL (ref 3.5–5.0)
Alkaline Phosphatase: 84 U/L (ref 38–126)
Anion gap: 13 (ref 5–15)
BUN: 35 mg/dL — ABNORMAL HIGH (ref 6–20)
CO2: 28 mmol/L (ref 22–32)
Calcium: 8.7 mg/dL — ABNORMAL LOW (ref 8.9–10.3)
Chloride: 91 mmol/L — ABNORMAL LOW (ref 98–111)
Creatinine, Ser: 1.34 mg/dL — ABNORMAL HIGH (ref 0.44–1.00)
GFR, Estimated: 47 mL/min — ABNORMAL LOW (ref >60)
Glucose, Bld: 523 mg/dL (ref 70–99)
Potassium: 4.3 mmol/L (ref 3.5–5.1)
Sodium: 132 mmol/L — ABNORMAL LOW (ref 135–145)
Total Bilirubin: 1.1 mg/dL (ref 0.3–1.2)
Total Protein: 6.2 g/dL — ABNORMAL LOW (ref 6.5–8.1)

## 2022-08-26 LAB — I-STAT VENOUS BLOOD GAS, ED
Acid-Base Excess: 4 mmol/L — ABNORMAL HIGH (ref 0.0–2.0)
Bicarbonate: 29 mmol/L — ABNORMAL HIGH (ref 20.0–28.0)
Calcium, Ion: 1.07 mmol/L — ABNORMAL LOW (ref 1.15–1.40)
HCT: 34 % — ABNORMAL LOW (ref 36.0–46.0)
Hemoglobin: 11.6 g/dL — ABNORMAL LOW (ref 12.0–15.0)
O2 Saturation: 88 %
Potassium: 4.3 mmol/L (ref 3.5–5.1)
Sodium: 134 mmol/L — ABNORMAL LOW (ref 135–145)
TCO2: 30 mmol/L (ref 22–32)
pCO2, Ven: 43.8 mmHg — ABNORMAL LOW (ref 44–60)
pH, Ven: 7.429 (ref 7.25–7.43)
pO2, Ven: 53 mmHg — ABNORMAL HIGH (ref 32–45)

## 2022-08-26 LAB — CBC WITH DIFFERENTIAL/PLATELET
Abs Immature Granulocytes: 0.03 10*3/uL (ref 0.00–0.07)
Basophils Absolute: 0 10*3/uL (ref 0.0–0.1)
Basophils Relative: 0 %
Eosinophils Absolute: 0.1 10*3/uL (ref 0.0–0.5)
Eosinophils Relative: 1 %
HCT: 34.1 % — ABNORMAL LOW (ref 36.0–46.0)
Hemoglobin: 11.3 g/dL — ABNORMAL LOW (ref 12.0–15.0)
Immature Granulocytes: 0 %
Lymphocytes Relative: 11 %
Lymphs Abs: 0.7 10*3/uL (ref 0.7–4.0)
MCH: 28.9 pg (ref 26.0–34.0)
MCHC: 33.1 g/dL (ref 30.0–36.0)
MCV: 87.2 fL (ref 80.0–100.0)
Monocytes Absolute: 0.4 10*3/uL (ref 0.1–1.0)
Monocytes Relative: 5 %
Neutro Abs: 5.8 10*3/uL (ref 1.7–7.7)
Neutrophils Relative %: 83 %
Platelets: 190 10*3/uL (ref 150–400)
RBC: 3.91 MIL/uL (ref 3.87–5.11)
RDW: 15.7 % — ABNORMAL HIGH (ref 11.5–15.5)
WBC: 7 10*3/uL (ref 4.0–10.5)
nRBC: 0 % (ref 0.0–0.2)

## 2022-08-26 LAB — CBG MONITORING, ED: Glucose-Capillary: 460 mg/dL — ABNORMAL HIGH (ref 70–99)

## 2022-08-26 LAB — RESP PANEL BY RT-PCR (RSV, FLU A&B, COVID)  RVPGX2
Influenza A by PCR: NEGATIVE
Influenza B by PCR: NEGATIVE
Resp Syncytial Virus by PCR: NEGATIVE
SARS Coronavirus 2 by RT PCR: NEGATIVE

## 2022-08-26 LAB — TROPONIN I (HIGH SENSITIVITY): Troponin I (High Sensitivity): 1343 ng/L (ref <18)

## 2022-08-26 LAB — BRAIN NATRIURETIC PEPTIDE: B Natriuretic Peptide: 1426 pg/mL — ABNORMAL HIGH (ref 0.0–100.0)

## 2022-08-26 LAB — LIPASE, BLOOD: Lipase: 25 U/L (ref 11–51)

## 2022-08-26 MED ORDER — INSULIN ASPART 100 UNIT/ML IJ SOLN
10.0000 [IU] | Freq: Once | INTRAMUSCULAR | Status: AC
  Administered 2022-08-26: 10 [IU] via INTRAVENOUS

## 2022-08-26 NOTE — ED Triage Notes (Signed)
[facility]. Ems states pt states blood sugar high along with cough, sob and nausea,was given solumedrol, 2 deonebs, zofran and 500 bolus ns in route. Pt was 92%ra before 2 deonebs now 100%ra after.

## 2022-08-26 NOTE — ED Provider Notes (Signed)
[facility] Emergency Department Provider Note MRN:  [id number]  Arrival date & time: 08/27/22     Chief Complaint   Hyperglycemia (Cough,nausea, sob)   History of Present Illness   [name] [name] is a 52 y.o. year-old female presents to the ED with chief complaint of SOB.  Reports associated cough.  Hx of CHF.  [facility] and was given 596ml bolus, solumedrol, zofran, and 2 duonebs by EMS.  States that she has been having worsening cough over the past day or so with associated SOB.  She states it seems like her "underlying heart problem."  History provided by patient.   Review of Systems  Pertinent positive and negative review of systems noted in HPI.    Physical Exam   Vitals:   08/27/22 0100 08/27/22 0108  BP: (!) 110/56   Pulse: 92   Resp: 18   Temp:  97.8 F (36.6 C)  SpO2: 96%     CONSTITUTIONAL:  chronically ill-appearing, NAD NEURO:  Alert and oriented x 3, CN 3-12 grossly intact EYES:  eyes equal and reactive ENT/NECK:  Supple, no stridor  CARDIO:  normal rate, regular rhythm, appears well-perfused  PULM:  No respiratory distress, crackles present GI/GU:  non-distended,  MSK/SPINE:  No gross deformities, no edema, moves all extremities  SKIN:  no rash, atraumatic   *Additional and/or pertinent findings included in MDM below  Diagnostic and Interventional Summary    EKG Interpretation  Date/Time:  Monday August 26 2022 21:16:13 EDT Ventricular Rate:  96 PR Interval:  176 QRS Duration: 108 QT Interval:  375 QTC Calculation: 474 R Axis:   117 Text Interpretation: Sinus rhythm Probable left atrial enlargement Right axis deviation Consider left ventricular hypertrophy Nonspecific T abnormalities, lateral leads Confirmed by Ripley Fraise [phone]) on 08/26/2022 11:07:03 PM       Labs Reviewed  CBC WITH DIFFERENTIAL/PLATELET - Abnormal; Notable for the following components:      Result Value   Hemoglobin 11.3  (*)    HCT 34.1 (*)    RDW 15.7 (*)    All other components within normal limits  COMPREHENSIVE METABOLIC PANEL - Abnormal; Notable for the following components:   Sodium 132 (*)    Chloride 91 (*)    Glucose, Bld 523 (*)    BUN 35 (*)    Creatinine, Ser 1.34 (*)    Calcium 8.7 (*)    Total Protein 6.2 (*)    GFR, Estimated 47 (*)    All other components within normal limits  BRAIN NATRIURETIC PEPTIDE - Abnormal; Notable for the following components:   B Natriuretic Peptide 1,426.0 (*)    All other components within normal limits  CBG MONITORING, ED - Abnormal; Notable for the following components:   Glucose-Capillary 460 (*)    All other components within normal limits  I-STAT VENOUS BLOOD GAS, ED - Abnormal; Notable for the following components:   pCO2, Ven 43.8 (*)    pO2, Ven 53 (*)    Bicarbonate 29.0 (*)    Acid-Base Excess 4.0 (*)    Sodium 134 (*)    Calcium, Ion 1.07 (*)    HCT 34.0 (*)    Hemoglobin 11.6 (*)    All other components within normal limits  TROPONIN I (HIGH SENSITIVITY) - Abnormal; Notable for the following components:   Troponin I (High Sensitivity) 1,343 (*)    All other components within normal limits  TROPONIN I (HIGH SENSITIVITY) - Abnormal;  Notable for the following components:   Troponin I (High Sensitivity) 1,214 (*)    All other components within normal limits  RESP PANEL BY RT-PCR (RSV, FLU A&B, COVID)  RVPGX2  LIPASE, BLOOD  URINALYSIS, ROUTINE W REFLEX MICROSCOPIC  HEPARIN LEVEL (UNFRACTIONATED)    DG Chest Port 1 View  Final Result      Medications  heparin ADULT infusion 100 units/mL (25000 units/286mL) (900 Units/hr Intravenous New Bag/Given 08/27/22 0123)  insulin aspart (novoLOG) injection 10 Units (10 Units Intravenous Given 08/26/22 2345)  aspirin chewable tablet 324 mg (324 mg Oral Given 08/27/22 0056)  heparin bolus via infusion 3,000 Units (3,000 Units Intravenous Bolus from Bag 08/27/22 0124)  morphine (PF) 4 MG/ML injection  4 mg (4 mg Intravenous Given 08/27/22 0140)     Procedures  /  Critical Care .Critical Care  Performed by: Montine Circle, PA-C Authorized by: Montine Circle, PA-C   Critical care provider statement:    Critical care time (minutes):  44   Critical care was necessary to treat or prevent imminent or life-threatening deterioration of the following conditions:  Circulatory failure   Critical care was time spent personally by me on the following activities:  Development of treatment plan with patient or surrogate, discussions with consultants, evaluation of patient's response to treatment, examination of patient, ordering and review of laboratory studies, ordering and review of radiographic studies, ordering and performing treatments and interventions, pulse oximetry, re-evaluation of patient's condition and review of old charts   ED Course and Medical Decision Making  I have reviewed the triage vital signs, the nursing notes, and pertinent available records from the EMR.  Social Determinants Affecting Complexity of Care: Patient has no clinically significant social determinants affecting this chief complaint..   ED Course:    Medical Decision Making Patient here with shortness of breath and cough.    Amount and/or Complexity of Data Reviewed Labs: ordered.    Details: Troponin 1343-> 1214, treated with heparin infusion Hyperglycemia 523, treated with insulin BNP 1400, CHF exacerbation could be cause for demand ischemia, though the trop is pretty high  Radiology: ordered and independent interpretation performed.    Details: No large effusion or opacity ECG/medicine tests: independent interpretation performed.    Details: Non-specific t-wave changes  Risk OTC drugs. Prescription drug management. Decision regarding hospitalization.     Consultants: I consulted with Dr. Humphrey Rolls, from cardiology, who will see the patient.  Recommends heparin, aspirin, and Hospitalist  admission.  I consulted with Dr. Marcello Moores, who requests repeat troponin.     Treatment and Plan: Patient's exam and diagnostic results are concerning for elevate troponin.Marland Kitchen ?Nstemi vs demand ischemia from CHF.  Feel that patient will need admission to the hospital for further treatment and evaluation.    Final Clinical Impressions(s) / ED Diagnoses     ICD-10-CM   1. NSTEMI (non-ST elevated myocardial infarction) ([place])  I21.4     2. Hyperglycemia  R73.9       ED Discharge Orders     None         Discharge Instructions Discussed with and Provided to Patient:   Discharge Instructions   None      Montine Circle, PA-C 08/27/22 0158    Ripley Fraise, MD 08/27/22 [phone]

## 2022-08-27 ENCOUNTER — Inpatient Hospital Stay (HOSPITAL_COMMUNITY): Payer: Medicaid Other

## 2022-08-27 ENCOUNTER — Encounter (HOSPITAL_COMMUNITY): Payer: Self-pay | Admitting: Internal Medicine

## 2022-08-27 DIAGNOSIS — I5023 Acute on chronic systolic (congestive) heart failure: Secondary | ICD-10-CM | POA: Diagnosis not present

## 2022-08-27 DIAGNOSIS — E871 Hypo-osmolality and hyponatremia: Secondary | ICD-10-CM | POA: Diagnosis present

## 2022-08-27 DIAGNOSIS — I428 Other cardiomyopathies: Secondary | ICD-10-CM | POA: Diagnosis present

## 2022-08-27 DIAGNOSIS — E0590 Thyrotoxicosis, unspecified without thyrotoxic crisis or storm: Secondary | ICD-10-CM | POA: Diagnosis present

## 2022-08-27 DIAGNOSIS — E1122 Type 2 diabetes mellitus with diabetic chronic kidney disease: Secondary | ICD-10-CM | POA: Diagnosis present

## 2022-08-27 DIAGNOSIS — E1165 Type 2 diabetes mellitus with hyperglycemia: Secondary | ICD-10-CM | POA: Diagnosis present

## 2022-08-27 DIAGNOSIS — G8929 Other chronic pain: Secondary | ICD-10-CM | POA: Diagnosis present

## 2022-08-27 DIAGNOSIS — Z609 Problem related to social environment, unspecified: Secondary | ICD-10-CM | POA: Diagnosis not present

## 2022-08-27 DIAGNOSIS — I214 Non-ST elevation (NSTEMI) myocardial infarction: Secondary | ICD-10-CM | POA: Diagnosis present

## 2022-08-27 DIAGNOSIS — N179 Acute kidney failure, unspecified: Secondary | ICD-10-CM | POA: Diagnosis present

## 2022-08-27 DIAGNOSIS — E1169 Type 2 diabetes mellitus with other specified complication: Secondary | ICD-10-CM | POA: Diagnosis present

## 2022-08-27 DIAGNOSIS — I340 Nonrheumatic mitral (valve) insufficiency: Secondary | ICD-10-CM | POA: Diagnosis present

## 2022-08-27 DIAGNOSIS — I5042 Chronic combined systolic (congestive) and diastolic (congestive) heart failure: Secondary | ICD-10-CM | POA: Diagnosis not present

## 2022-08-27 DIAGNOSIS — J449 Chronic obstructive pulmonary disease, unspecified: Secondary | ICD-10-CM | POA: Diagnosis present

## 2022-08-27 DIAGNOSIS — Z794 Long term (current) use of insulin: Secondary | ICD-10-CM | POA: Diagnosis not present

## 2022-08-27 DIAGNOSIS — Z5901 Sheltered homelessness: Secondary | ICD-10-CM | POA: Diagnosis not present

## 2022-08-27 DIAGNOSIS — J42 Unspecified chronic bronchitis: Secondary | ICD-10-CM | POA: Diagnosis not present

## 2022-08-27 DIAGNOSIS — I959 Hypotension, unspecified: Secondary | ICD-10-CM | POA: Diagnosis present

## 2022-08-27 DIAGNOSIS — N1831 Chronic kidney disease, stage 3a: Secondary | ICD-10-CM | POA: Diagnosis present

## 2022-08-27 DIAGNOSIS — Z79899 Other long term (current) drug therapy: Secondary | ICD-10-CM | POA: Diagnosis not present

## 2022-08-27 DIAGNOSIS — E785 Hyperlipidemia, unspecified: Secondary | ICD-10-CM | POA: Diagnosis present

## 2022-08-27 DIAGNOSIS — I130 Hypertensive heart and chronic kidney disease with heart failure and stage 1 through stage 4 chronic kidney disease, or unspecified chronic kidney disease: Secondary | ICD-10-CM | POA: Diagnosis present

## 2022-08-27 DIAGNOSIS — Z1152 Encounter for screening for COVID-19: Secondary | ICD-10-CM | POA: Diagnosis not present

## 2022-08-27 DIAGNOSIS — I5043 Acute on chronic combined systolic (congestive) and diastolic (congestive) heart failure: Secondary | ICD-10-CM | POA: Diagnosis present

## 2022-08-27 DIAGNOSIS — F209 Schizophrenia, unspecified: Secondary | ICD-10-CM | POA: Diagnosis present

## 2022-08-27 DIAGNOSIS — E860 Dehydration: Secondary | ICD-10-CM | POA: Diagnosis present

## 2022-08-27 DIAGNOSIS — R0789 Other chest pain: Secondary | ICD-10-CM | POA: Diagnosis present

## 2022-08-27 DIAGNOSIS — F419 Anxiety disorder, unspecified: Secondary | ICD-10-CM | POA: Diagnosis present

## 2022-08-27 DIAGNOSIS — Z888 Allergy status to other drugs, medicaments and biological substances status: Secondary | ICD-10-CM | POA: Diagnosis not present

## 2022-08-27 DIAGNOSIS — R739 Hyperglycemia, unspecified: Secondary | ICD-10-CM | POA: Diagnosis not present

## 2022-08-27 LAB — I-STAT VENOUS BLOOD GAS, ED
Acid-Base Excess: 1 mmol/L (ref 0.0–2.0)
Bicarbonate: 25.9 mmol/L (ref 20.0–28.0)
Calcium, Ion: 1.08 mmol/L — ABNORMAL LOW (ref 1.15–1.40)
HCT: 37 % (ref 36.0–46.0)
Hemoglobin: 12.6 g/dL (ref 12.0–15.0)
O2 Saturation: 98 %
Potassium: 4.7 mmol/L (ref 3.5–5.1)
Sodium: 131 mmol/L — ABNORMAL LOW (ref 135–145)
TCO2: 27 mmol/L (ref 22–32)
pCO2, Ven: 40 mmHg — ABNORMAL LOW (ref 44–60)
pH, Ven: 7.419 (ref 7.25–7.43)
pO2, Ven: 100 mmHg — ABNORMAL HIGH (ref 32–45)

## 2022-08-27 LAB — ECHOCARDIOGRAM LIMITED
Calc EF: 22.2 %
Height: 66 in
S' Lateral: 6.5 cm
Single Plane A2C EF: 19.8 %
Single Plane A4C EF: 23.4 %
Weight: 2720 oz

## 2022-08-27 LAB — HEPARIN LEVEL (UNFRACTIONATED)
Heparin Unfractionated: 0.26 IU/mL — ABNORMAL LOW (ref 0.30–0.70)
Heparin Unfractionated: 0.12 IU/mL — ABNORMAL LOW (ref 0.30–0.70)
Heparin Unfractionated: 0.14 IU/mL — ABNORMAL LOW (ref 0.30–0.70)

## 2022-08-27 LAB — URINALYSIS, ROUTINE W REFLEX MICROSCOPIC
Bilirubin Urine: NEGATIVE
Glucose, UA: >=500 mg/dL — AB
Hgb urine dipstick: NEGATIVE
Ketones, ur: NEGATIVE mg/dL
Leukocytes,Ua: NEGATIVE
Nitrite: NEGATIVE
Protein, ur: NEGATIVE mg/dL
Specific Gravity, Urine: 1.021 (ref 1.005–1.030)
pH: 6 (ref 5.0–8.0)

## 2022-08-27 LAB — CBG MONITORING, ED
Glucose-Capillary: 521 mg/dL (ref 70–99)
Glucose-Capillary: 541 mg/dL (ref 70–99)
Glucose-Capillary: 405 mg/dL — ABNORMAL HIGH (ref 70–99)
Glucose-Capillary: 441 mg/dL — ABNORMAL HIGH (ref 70–99)
Glucose-Capillary: 317 mg/dL — ABNORMAL HIGH (ref 70–99)

## 2022-08-27 LAB — GLUCOSE, CAPILLARY
Glucose-Capillary: 138 mg/dL — ABNORMAL HIGH (ref 70–99)
Glucose-Capillary: 141 mg/dL — ABNORMAL HIGH (ref 70–99)
Glucose-Capillary: 321 mg/dL — ABNORMAL HIGH (ref 70–99)
Glucose-Capillary: 296 mg/dL — ABNORMAL HIGH (ref 70–99)

## 2022-08-27 LAB — MAGNESIUM: Magnesium: 2.4 mg/dL (ref 1.7–2.4)

## 2022-08-27 LAB — TROPONIN I (HIGH SENSITIVITY): Troponin I (High Sensitivity): 1214 ng/L (ref <18)

## 2022-08-27 LAB — T4, FREE: Free T4: 1.41 ng/dL — ABNORMAL HIGH (ref 0.61–1.12)

## 2022-08-27 LAB — LIPID PANEL
Cholesterol: 172 mg/dL (ref 0–200)
HDL: 80 mg/dL (ref >40)
LDL Cholesterol: 79 mg/dL (ref 0–99)
Total CHOL/HDL Ratio: 2.2 RATIO
Triglycerides: 63 mg/dL (ref <150)
VLDL: 13 mg/dL (ref 0–40)

## 2022-08-27 LAB — HEMOGLOBIN A1C
Hgb A1c MFr Bld: 11 % — ABNORMAL HIGH (ref 4.8–5.6)
Mean Plasma Glucose: 269 mg/dL

## 2022-08-27 LAB — TSH: TSH: 0.259 u[IU]/mL — ABNORMAL LOW (ref 0.350–4.500)

## 2022-08-27 LAB — BRAIN NATRIURETIC PEPTIDE: B Natriuretic Peptide: 1118 pg/mL — ABNORMAL HIGH (ref 0.0–100.0)

## 2022-08-27 MED ORDER — ONDANSETRON HCL 4 MG/2ML IJ SOLN
4.0000 mg | Freq: Four times a day (QID) | INTRAMUSCULAR | Status: DC | PRN
  Administered 2022-08-27 – 2022-08-28 (×3): 4 mg via INTRAVENOUS
  Filled 2022-08-27 (×4): qty 2

## 2022-08-27 MED ORDER — INSULIN GLARGINE-YFGN 100 UNIT/ML ~~LOC~~ SOLN
20.0000 [IU] | Freq: Every day | SUBCUTANEOUS | Status: DC
  Filled 2022-08-27: qty 0.2

## 2022-08-27 MED ORDER — INSULIN GLARGINE-YFGN 100 UNIT/ML ~~LOC~~ SOLN
15.0000 [IU] | Freq: Two times a day (BID) | SUBCUTANEOUS | Status: DC
  Administered 2022-08-27: 15 [IU] via SUBCUTANEOUS
  Filled 2022-08-27 (×4): qty 0.15

## 2022-08-27 MED ORDER — HEPARIN BOLUS VIA INFUSION
3000.0000 [IU] | Freq: Once | INTRAVENOUS | Status: AC
  Administered 2022-08-27: 3000 [IU] via INTRAVENOUS
  Filled 2022-08-27: qty 3000

## 2022-08-27 MED ORDER — HEPARIN BOLUS VIA INFUSION
2250.0000 [IU] | Freq: Once | INTRAVENOUS | Status: AC
  Administered 2022-08-27: 2250 [IU] via INTRAVENOUS
  Filled 2022-08-27: qty 2250

## 2022-08-27 MED ORDER — HYDROCODONE-ACETAMINOPHEN 5-325 MG PO TABS
1.0000 | ORAL_TABLET | Freq: Four times a day (QID) | ORAL | Status: DC | PRN
  Administered 2022-08-27 – 2022-08-29 (×8): 1 via ORAL
  Filled 2022-08-27 (×9): qty 1

## 2022-08-27 MED ORDER — MOMETASONE FURO-FORMOTEROL FUM 200-5 MCG/ACT IN AERO
2.0000 | INHALATION_SPRAY | Freq: Two times a day (BID) | RESPIRATORY_TRACT | Status: DC
  Administered 2022-08-27 – 2022-08-29 (×5): 2 via RESPIRATORY_TRACT
  Filled 2022-08-27: qty 8.8

## 2022-08-27 MED ORDER — MORPHINE SULFATE (PF) 4 MG/ML IV SOLN
2.0000 mg | INTRAVENOUS | Status: DC | PRN
  Administered 2022-08-27: 2 mg via INTRAVENOUS
  Filled 2022-08-27: qty 1

## 2022-08-27 MED ORDER — DEXTROSE 50 % IV SOLN: 0.0000 mL | INTRAVENOUS | Status: DC | PRN

## 2022-08-27 MED ORDER — HEPARIN (PORCINE) 25000 UT/250ML-% IV SOLN
1150.0000 [IU]/h | INTRAVENOUS | Status: DC
  Administered 2022-08-27: 900 [IU]/h via INTRAVENOUS
  Filled 2022-08-27: qty 250

## 2022-08-27 MED ORDER — ROSUVASTATIN CALCIUM 20 MG PO TABS
20.0000 mg | ORAL_TABLET | Freq: Every day | ORAL | Status: DC
  Administered 2022-08-27 – 2022-08-29 (×3): 20 mg via ORAL
  Filled 2022-08-27 (×3): qty 1

## 2022-08-27 MED ORDER — ISOSORBIDE MONONITRATE ER 30 MG PO TB24: 60.0000 mg | ORAL_TABLET | Freq: Every day | ORAL | Status: DC

## 2022-08-27 MED ORDER — MORPHINE SULFATE (PF) 4 MG/ML IV SOLN
4.0000 mg | Freq: Once | INTRAVENOUS | Status: AC
  Administered 2022-08-27: 4 mg via INTRAVENOUS
  Filled 2022-08-27: qty 1

## 2022-08-27 MED ORDER — INSULIN ASPART 100 UNIT/ML IJ SOLN
0.0000 [IU] | Freq: Three times a day (TID) | INTRAMUSCULAR | Status: DC
  Administered 2022-08-27: 11 [IU] via SUBCUTANEOUS
  Administered 2022-08-28: 3 [IU] via SUBCUTANEOUS
  Administered 2022-08-28: 15 [IU] via SUBCUTANEOUS
  Administered 2022-08-28: 8 [IU] via SUBCUTANEOUS
  Administered 2022-08-29: 11 [IU] via SUBCUTANEOUS
  Administered 2022-08-29: 3 [IU] via SUBCUTANEOUS

## 2022-08-27 MED ORDER — ASPIRIN 81 MG PO TBEC
81.0000 mg | DELAYED_RELEASE_TABLET | Freq: Every day | ORAL | Status: DC
  Administered 2022-08-28 – 2022-08-29 (×2): 81 mg via ORAL
  Filled 2022-08-27 (×2): qty 1

## 2022-08-27 MED ORDER — INSULIN GLARGINE-YFGN 100 UNIT/ML ~~LOC~~ SOLN
12.0000 [IU] | Freq: Two times a day (BID) | SUBCUTANEOUS | Status: DC
  Administered 2022-08-27 – 2022-08-28 (×2): 12 [IU] via SUBCUTANEOUS
  Filled 2022-08-27 (×3): qty 0.12

## 2022-08-27 MED ORDER — NITROGLYCERIN 0.4 MG SL SUBL: 0.4000 mg | SUBLINGUAL_TABLET | SUBLINGUAL | Status: DC | PRN

## 2022-08-27 MED ORDER — ACETAMINOPHEN 325 MG PO TABS
650.0000 mg | ORAL_TABLET | ORAL | Status: DC | PRN
  Administered 2022-08-27: 650 mg via ORAL
  Filled 2022-08-27: qty 2

## 2022-08-27 MED ORDER — INFLUENZA VAC SPLIT QUAD 0.5 ML IM SUSY: 0.5000 mL | PREFILLED_SYRINGE | INTRAMUSCULAR | Status: DC

## 2022-08-27 MED ORDER — MORPHINE SULFATE (PF) 4 MG/ML IV SOLN
1.0000 mg | INTRAVENOUS | Status: DC | PRN
  Administered 2022-08-27: 1 mg via INTRAVENOUS
  Filled 2022-08-27: qty 1

## 2022-08-27 MED ORDER — ATORVASTATIN CALCIUM 10 MG PO TABS: 20.0000 mg | ORAL_TABLET | Freq: Every day | ORAL | Status: DC

## 2022-08-27 MED ORDER — DEXTROSE IN LACTATED RINGERS 5 % IV SOLN: INTRAVENOUS | Status: DC

## 2022-08-27 MED ORDER — INSULIN ASPART 100 UNIT/ML IJ SOLN
0.0000 [IU] | Freq: Every day | INTRAMUSCULAR | Status: DC
  Administered 2022-08-27: 3 [IU] via SUBCUTANEOUS

## 2022-08-27 MED ORDER — HEPARIN (PORCINE) 25000 UT/250ML-% IV SOLN
1300.0000 [IU]/h | INTRAVENOUS | Status: DC
  Administered 2022-08-27: 1150 [IU]/h via INTRAVENOUS
  Administered 2022-08-28: 1300 [IU]/h via INTRAVENOUS
  Filled 2022-08-27 (×2): qty 250

## 2022-08-27 MED ORDER — ASPIRIN 81 MG PO CHEW
324.0000 mg | CHEWABLE_TABLET | Freq: Once | ORAL | Status: AC
  Administered 2022-08-27: 324 mg via ORAL
  Filled 2022-08-27: qty 4

## 2022-08-27 MED ORDER — RISPERIDONE 1 MG PO TABS
1.0000 mg | ORAL_TABLET | Freq: Every day | ORAL | Status: DC
  Administered 2022-08-27 – 2022-08-29 (×3): 1 mg via ORAL
  Filled 2022-08-27 (×3): qty 1

## 2022-08-27 MED ORDER — ASPIRIN 300 MG RE SUPP: 300.0000 mg | RECTAL | Status: DC

## 2022-08-27 MED ORDER — ASPIRIN 81 MG PO TBEC: 81.0000 mg | DELAYED_RELEASE_TABLET | Freq: Every day | ORAL | Status: DC

## 2022-08-27 MED ORDER — RISPERIDONE 2 MG PO TABS
2.0000 mg | ORAL_TABLET | Freq: Every day | ORAL | Status: DC
  Administered 2022-08-27 – 2022-08-28 (×2): 2 mg via ORAL
  Filled 2022-08-27 (×4): qty 1

## 2022-08-27 MED ORDER — LACTATED RINGERS IV SOLN: INTRAVENOUS | Status: DC

## 2022-08-27 MED ORDER — LACTATED RINGERS IV SOLN: INTRAVENOUS | Status: AC

## 2022-08-27 MED ORDER — INSULIN ASPART 100 UNIT/ML IJ SOLN: 0.0000 [IU] | Freq: Three times a day (TID) | INTRAMUSCULAR | Status: DC

## 2022-08-27 MED ORDER — ASPIRIN 81 MG PO CHEW: 324.0000 mg | CHEWABLE_TABLET | ORAL | Status: DC

## 2022-08-27 MED ORDER — FUROSEMIDE 10 MG/ML IJ SOLN: 80.0000 mg | Freq: Two times a day (BID) | INTRAMUSCULAR | Status: DC

## 2022-08-27 MED ORDER — RISPERIDONE 1 MG PO TABS: 1.0000 mg | ORAL_TABLET | ORAL | Status: DC

## 2022-08-27 MED ORDER — INSULIN REGULAR(HUMAN) IN NACL 100-0.9 UT/100ML-% IV SOLN
INTRAVENOUS | Status: DC
  Administered 2022-08-27: 14 [IU]/h via INTRAVENOUS
  Filled 2022-08-27: qty 100

## 2022-08-27 MED ORDER — INSULIN ASPART 100 UNIT/ML IJ SOLN: 0.0000 [IU] | Freq: Every day | INTRAMUSCULAR | Status: DC

## 2022-08-27 MED ORDER — BUMETANIDE 2 MG PO TABS
2.0000 mg | ORAL_TABLET | Freq: Two times a day (BID) | ORAL | Status: DC
  Filled 2022-08-27: qty 1

## 2022-08-27 MED ORDER — ISOSORBIDE MONONITRATE ER 30 MG PO TB24
15.0000 mg | ORAL_TABLET | Freq: Every day | ORAL | Status: DC
  Administered 2022-08-27 – 2022-08-29 (×3): 15 mg via ORAL
  Filled 2022-08-27 (×3): qty 1

## 2022-08-27 MED ORDER — METOPROLOL SUCCINATE ER 25 MG PO TB24
25.0000 mg | ORAL_TABLET | Freq: Every day | ORAL | Status: DC
  Administered 2022-08-27 – 2022-08-29 (×3): 25 mg via ORAL
  Filled 2022-08-27 (×3): qty 1

## 2022-08-27 MED ORDER — INSULIN ASPART 100 UNIT/ML IJ SOLN: 10.0000 [IU] | Freq: Once | INTRAMUSCULAR | Status: DC

## 2022-08-27 NOTE — Progress Notes (Signed)
PROGRESS NOTE                                                                                                                                                                                                             Patient Demographics:    [name] [name], is a 52 y.o. female, DOB - 1971-02-26, [id number]  Outpatient Primary MD for the patient is Patient, No Pcp Per    LOS - 0  Admit date - 08/26/2022    Chief Complaint  Patient presents with   Hyperglycemia    Cough,nausea, sob       Brief Narrative (HPI from H&P)    52 y.o. female with medical history significant of bipolar disorder, CHFref, uncontrolled diabetes, schizophrenia,HTN, CKDIIa, Asthma/COPD, homelessness, anxiety ,and migraines with interim history of recent admission 3/12-3/16 with diagnosis of AKI and COPD exacerbation.  She is a diabetic but was unable to take her insulin as she is homeless and does not have access to insurance and most of her medications, she now presented to the hospital with chest pain and tightness in the ER she was diagnosed with NSTEMI and poorly controlled DM type II.  Cardiology was consulted and hospitalist team was requested to admit the patient.   Subjective:    Michael Boston today has, No headache, No chest pain, No abdominal pain - No Nausea, No new weakness tingling or numbness, no SOB   Assessment  & Plan :    NSTEMI.  In a patient with DM type II, dyslipidemia hypertension, poor access to care and medications due to homelessness and lack of insurance, she has been seen by cardiology currently on low-dose beta-blocker, high intensity statin and heparin drip, aspirin has been given.  Thankfully she is chest pain-free right now, on exam she actually appears dehydrated due to her poorly controlled diabetes mellitus and hypotensive.  Will be placed on IV fluids, hold Lasix and monitor.  Cardiology on board, per cardiologist for  now medical management.  DM type II in poor control.  Currently not in DKA but close to nonketotic hyperosmolar state.  She did not have access to insulin recently lack of insurance and homelessness, placed on Endo tool insulin drip along with twice daily Semglee, diabetic and insulin education, check A1c.  Dyslipidemia.  Placed on high intensity statin.  LDL near goal but  not at goal.  Hypotension.  Due to dehydration from poorly controlled diabetes mellitus, hydrate and monitor.  Mildly suppressed TSH could be sick euthyroid syndrome, check free T4.  Chronic systolic CHF.  EF 20% on recent echocardiogram with global hypokinesis, currently dehydrated, blood pressure too low to tolerate ACE ARB/Entresto, low-dose beta-blocker and Imdur if tolerated by blood pressure.  Gentle hydration for 24 hours.  Bipolar disorder.  Resume risperidone.  AKI on CKD stage II.  Hydrate and monitor, gentle fluids.  Avoid drop in blood pressure any further.  Homelessness.  TOC consulted.      Condition - Extremely Guarded  Family Communication  : None present  Code Status : Full code  Consults  : Cardiology  PUD Prophylaxis :   Procedures  :     Recent TTE noted below.  This was done 1 week ago with EF 20% and global hypokinesis.      Disposition Plan  :    Status is: Inpatient  DVT Prophylaxis  :    Heparin drip  Lab Results  Component Value Date   PLT 190 08/26/2022    Diet :  Diet Order             Diet heart healthy/carb modified Room service appropriate? Yes; Fluid consistency: Thin; Fluid restriction: 1500 mL Fluid  Diet effective now                    Inpatient Medications  Scheduled Meds:  [START ON 08/28/2022] aspirin EC  81 mg Oral Daily   insulin glargine-yfgn  15 Units Subcutaneous BID   isosorbide mononitrate  15 mg Oral Daily   metoprolol succinate  25 mg Oral Daily   mometasone-formoterol  2 puff Inhalation BID   risperiDONE  1 mg Oral Daily   And    risperiDONE  2 mg Oral QHS   rosuvastatin  20 mg Oral Daily   Continuous Infusions:  heparin 900 Units/hr (08/27/22 0123)   insulin     lactated ringers 75 mL/hr at 08/27/22 0800   PRN Meds:.acetaminophen, dextrose, morphine (PF), nitroGLYCERIN, ondansetron (ZOFRAN) IV  Antibiotics  :    Anti-infectives (From admission, onward)    None         Objective:   Vitals:   08/27/22 0400 08/27/22 0530 08/27/22 0544 08/27/22 0600  BP: (!) 97/47 106/65  (!) 105/57  Pulse: 92 96  95  Resp: 17 18  18   Temp:   98.3 F (36.8 C)   TempSrc:      SpO2: 95% 93%  96%  Weight:      Height:        Wt Readings from Last 3 Encounters:  08/26/22 77.1 kg  08/21/22 80.3 kg  08/20/22 80.3 kg    No intake or output data in the 24 hours ending 08/27/22 0801   Physical Exam  Awake Alert, No new F.N deficits, Normal affect [place].AT,PERRAL Supple Neck, No JVD,   Symmetrical Chest wall movement, Good air movement bilaterally, CTAB RRR,No Gallops,Rubs or new Murmurs,  +ve B.Sounds, Abd Soft, No tenderness,   No Cyanosis, Clubbing or edema       Data Review:    Recent Labs  Lab 08/20/22 2321 08/21/22 0121 08/22/22 0331 08/24/22 0359 08/26/22 2225 08/26/22 2257  WBC 7.8 6.1 8.0 7.1 7.0  --   HGB 11.6* 11.0* 10.4* 11.1* 11.3* 11.6*  HCT 36.2 34.4* 32.2* 34.3* 34.1* 34.0*  PLT 213 190 176 165 190  --  MCV 90.0 89.6 89.4 88.6 87.2  --   MCH 28.9 28.6 28.9 28.7 28.9  --   MCHC 32.0 32.0 32.3 32.4 33.1  --   RDW 15.7* 15.6* 15.5 15.5 15.7*  --   LYMPHSABS 0.4*  --   --   --  0.7  --   MONOABS 0.1  --   --   --  0.4  --   EOSABS 0.0  --   --   --  0.1  --   BASOSABS 0.0  --   --   --  0.0  --     Recent Labs  Lab 08/20/22 1404 08/20/22 1656 08/20/22 2322 08/21/22 0121 08/22/22 0331 08/23/22 1003 08/24/22 0359 08/26/22 2225 08/26/22 2257 08/27/22 0341  NA 138   < > 129* 134* 135 136 137 132* 134*  --   K 3.1*   < > 5.0 4.1 4.2 4.3 3.8 4.3 4.3  --   CL 102  --  89* 97*  102 104 99 91*  --   --   CO2 24  --  24 24 26 24 27 28   --   --   ANIONGAP 12  --  16* 13 7 8 11 13   --   --   GLUCOSE 268*  --  686* 537* 210* 129* 130* 523*  --   --   BUN 25*  --  32* 33* 26* 17 23* 35*  --   --   CREATININE 1.09*  --  1.61* 1.60* 1.14* 0.88 1.03* 1.34*  --   --   AST  --   --  23  --  11*  --  8* 26  --   --   ALT  --   --  30  --  18  --  18 27  --   --   ALKPHOS  --   --  101  --  83  --  73 84  --   --   BILITOT  --   --  0.4  --  0.6  --  0.6 1.1  --   --   ALBUMIN  --   --  3.8  --  3.0*  --  3.0* 3.8  --   --   TSH  --   --   --   --   --   --   --   --   --  0.259*  BNP 368.1*  --   --   --   --   --   --  1,426.0*  --  1,118.0*  MG  --   --   --   --  2.0  --  2.0  --   --   --   CALCIUM 7.4*  --  8.5* 8.1* 8.2* 8.4* 8.5* 8.7*  --   --    < > = values in this interval not displayed.   Recent Labs    08/27/22 0341  CHOL 172  HDL 80  LDLCALC 79  TRIG 63  CHOLHDL 2.2    Lab Results  Component Value Date   HGBA1C 8.1 (H) 05/16/2022    Recent Labs    08/27/22 0341  TSH 0.259*    Radiology Reports DG Chest Port 1 View  Result Date: 08/26/2022 CLINICAL DATA:  Chest pain and shortness of breath. EXAM: PORTABLE CHEST 1 VIEW COMPARISON:  August 20, 2022 FINDINGS: The cardiac silhouette is mildly enlarged and unchanged  in size. Low lung volumes are seen, likely secondary to suboptimal patient inspiration. Mild atelectasis is noted within the right lung base. There is no evidence of a pleural effusion or pneumothorax. The visualized skeletal structures are unremarkable. IMPRESSION: Low lung volumes with mild right basilar atelectasis. Electronically Signed   By: Virgina Norfolk M.D.   On: 08/26/2022 22:52   ECHOCARDIOGRAM COMPLETE  Result Date: 08/23/2022    ECHOCARDIOGRAM REPORT   Patient Name:   KRINA BARYLSKI Date of Exam: 08/23/2022 Medical Rec #:  [id number]       Height:       66.0 in Accession #:    [id number]      Weight:       177.0 lb Date of  Birth:  26-May-1971       BSA:          1.899 m Patient Age:    3 years        BP:           116/61 mmHg Patient Gender: F               HR:           95 bpm. Exam Location:  Inpatient Procedure: 2D Echo, Intracardiac Opacification Agent, Color Doppler and Cardiac            Doppler Indications:    CHF  History:        Patient has prior history of Echocardiogram examinations, most                 recent 05/15/2022. CHF, COPD; Risk Factors:Hypertension, Diabetes                 and Current Smoker.  Sonographer:    Marella Chimes Referring Phys: [place]  1. Left ventricular ejection fraction, by estimation, is <20%. The left ventricle has severely decreased function. The left ventricle demonstrates global hypokinesis. The left ventricular internal cavity size was severely dilated. Left ventricular diastolic parameters are indeterminate.  2. Right ventricular systolic function is normal. The right ventricular size is normal. Tricuspid regurgitation signal is inadequate for assessing PA pressure.  3. Left atrial size was moderately dilated.  4. The mitral valve is normal in structure. Moderate mitral valve regurgitation. No evidence of mitral stenosis.  5. The aortic valve is tricuspid. Aortic valve regurgitation is not visualized. No aortic stenosis is present.  6. The inferior vena cava is dilated in size with >50% respiratory variability, suggesting right atrial pressure of 8 mmHg. Comparison(s): No significant change from prior study. Conclusion(s)/Recommendation(s): No left ventricular mural or apical thrombus/thrombi. FINDINGS  Left Ventricle: Left ventricular ejection fraction, by estimation, is <20%. The left ventricle has severely decreased function. The left ventricle demonstrates global hypokinesis. Definity contrast agent was given IV to delineate the left ventricular endocardial borders. The left ventricular internal cavity size was severely dilated. There is no left ventricular  hypertrophy. Left ventricular diastolic parameters are indeterminate. Right Ventricle: The right ventricular size is normal. No increase in right ventricular wall thickness. Right ventricular systolic function is normal. Tricuspid regurgitation signal is inadequate for assessing PA pressure. Left Atrium: Left atrial size was moderately dilated. Right Atrium: Right atrial size was normal in size. Pericardium: There is no evidence of pericardial effusion. Mitral Valve: The mitral valve is normal in structure. Moderate mitral valve regurgitation. No evidence of mitral valve stenosis. Tricuspid Valve: The tricuspid valve is normal in structure. Tricuspid valve regurgitation is  trivial. No evidence of tricuspid stenosis. Aortic Valve: The aortic valve is tricuspid. Aortic valve regurgitation is not visualized. No aortic stenosis is present. Aortic valve mean gradient measures 4.0 mmHg. Aortic valve peak gradient measures 11.0 mmHg. Aortic valve area, by VTI measures 1.38  cm. Pulmonic Valve: The pulmonic valve was not well visualized. Pulmonic valve regurgitation is not visualized. No evidence of pulmonic stenosis. Aorta: The aortic root, ascending aorta, aortic arch and descending aorta are all structurally normal, with no evidence of dilitation or obstruction. Venous: The inferior vena cava is dilated in size with greater than 50% respiratory variability, suggesting right atrial pressure of 8 mmHg. IAS/Shunts: The atrial septum is grossly normal.  LEFT VENTRICLE PLAX 2D LVIDd:         6.90 cm   Diastology LVIDs:         6.60 cm   LV e' medial:    9.25 cm/s LV PW:         1.20 cm   LV E/e' medial:  10.4 LV IVS:        0.90 cm   LV e' lateral:   11.30 cm/s LVOT diam:     2.20 cm   LV E/e' lateral: 8.5 LV SV:         39 LV SV Index:   20 LVOT Area:     3.80 cm  RIGHT VENTRICLE RV S prime:     10.90 cm/s TAPSE (M-mode): 2.1 cm LEFT ATRIUM              Index        RIGHT ATRIUM           Index LA diam:        5.10 cm   2.69 cm/m   RA Area:     12.20 cm LA Vol (A2C):   106.0 ml 55.83 ml/m  RA Volume:   28.70 ml  15.12 ml/m LA Vol (A4C):   68.5 ml  36.08 ml/m LA Biplane Vol: 89.7 ml  47.24 ml/m  AORTIC VALVE AV Area (Vmax):    1.33 cm AV Area (Vmean):   1.56 cm AV Area (VTI):     1.38 cm AV Vmax:           166.00 cm/s AV Vmean:          93.100 cm/s AV VTI:            0.280 m AV Peak Grad:      11.0 mmHg AV Mean Grad:      4.0 mmHg LVOT Vmax:         58.10 cm/s LVOT Vmean:        38.200 cm/s LVOT VTI:          0.102 m LVOT/AV VTI ratio: 0.36  AORTA Ao Root diam: 3.30 cm Ao Asc diam:  3.20 cm MITRAL VALVE MV Area (PHT): 3.35 cm    SHUNTS MV Decel Time: 227 msec    Systemic VTI:  0.10 m MR Peak grad: 75.0 mmHg    Systemic Diam: 2.20 cm MR Vmax:      433.00 cm/s MV E velocity: 96.55 cm/s MV A velocity: 96.40 cm/s MV E/A ratio:  1.00 Buford Dresser MD Electronically signed by Buford Dresser MD Signature Date/Time: 08/23/2022/4:55:05 PM    Final       Signature  -   Lala Lund M.D on 08/27/2022 at 8:01 AM   -  To page go to [email or web address]

## 2022-08-27 NOTE — Progress Notes (Signed)
ANTICOAGULATION CONSULT NOTE - Initial Consult  Pharmacy Consult for heparin Indication: chest pain/ACS  Allergies  Allergen Reactions   Tramadol Nausea Only    "feeling like I'm on fire"   Wellbutrin [Bupropion] Other (See Comments)    rage   Nicotine Rash    Patient states she is allergic to the Nicotine patch and they cause her to break out in a rash.  She states she can smoke cigarettes.    Patient Measurements: Height: 5\' 6"  (167.6 cm) Weight: 77.1 kg (170 lb) IBW/kg (Calculated) : 59.3 Heparin Dosing Weight: 75kg  Vital Signs: Temp: 97.9 F (36.6 C) (03/18 2120) Temp Source: Oral (03/18 2120) BP: 107/61 (03/19 0030) Pulse Rate: 95 (03/19 0030)  Labs: Recent Labs    08/24/22 0359 08/26/22 2225 08/26/22 2257  HGB 11.1* 11.3* 11.6*  HCT 34.3* 34.1* 34.0*  PLT 165 190  --   CREATININE 1.03* 1.34*  --   TROPONINIHS  --  1,343*  --     Estimated Creatinine Clearance: 50.9 mL/min (A) (by C-G formula based on SCr of 1.34 mg/dL (H)).   Medical History: Past Medical History:  Diagnosis Date   Anxiety disorder    Asthma    Bipolar affective disorder ([place])    Blood transfusion without reported diagnosis    CHF (congestive heart failure) ([place])    Depression    Diabetes mellitus    Migraine    Schizophrenia ([place])     Assessment: 52yo female discharged from [facility] 3/16, returns this evening c/o hyperglycemia, cough, SOB, and nausea, troponin found to be elevated >> to begin heparin.  Goal of Therapy:  Heparin level 0.3-0.7 units/ml Monitor platelets by anticoagulation protocol: Yes   Plan:  Heparin 3000 units IV bolus x1 followed by infusion at 900 units/hr. Monitor heparin levels and CBC.  Wynona Neat, PharmD, BCPS  08/27/2022,1:06 AM

## 2022-08-27 NOTE — Progress Notes (Signed)
Echocardiogram 2D Echocardiogram has been performed.  Ronny Flurry 08/27/2022, 4:23 PM

## 2022-08-27 NOTE — Consult Note (Signed)
Cardiology Consultation   Patient ID: DAELYNN AMINOV MRN: [id number]; DOB: 03-30-1971  Admit date: 08/26/2022 Date of Consult: 08/27/2022  PCP:  Patient, No Pcp Per   [place] Providers Cardiologist:  None        Patient Profile:   [name] is a 52 y.o. female with a hx of bipolar disorder, CHF, diabetes, schizophrenia and migraines who is being seen 08/27/2022 for the evaluation of SOB/NSTEMI at the request of Dr. Marcello Moores  History of Present Illness:   [name] is a 52 y.o. female with a hx of bipolar disorder, CHF (EF 20%), MR,  diabetes, COPD, schizophrenia and migraines who is being seen 08/27/2022 for the evaluation of SOB/NSTEMI at the request of Dr. Marcello Moores. She has had multiple ED visits in the past 1 month. Of note she was discharged on 08/24/22 after COPD exacerbation treated with steroids. She is homeless and often finds it hard to take medications including Insulin and GDMT. Today she comes to the ED, complaining of SOB. Has cough as well. Denies any chest pain. [facility] and was given 576ml bolus, solumedrol, zofran, and 2 duonebs by EMS. States that she has been having worsening cough over the past day or so with associated SOB. In the ED, glucose was in 500s. Troponin was in 1000s; EKG showed no acute ST elevation. She denied any CP. VS were stable. She was given morphine and aspirin 325 mg. Cardiology was consulted for NSTEMI.    Past Medical History:  Diagnosis Date   Anxiety disorder    Asthma    Bipolar affective disorder ([place])    Blood transfusion without reported diagnosis    CHF (congestive heart failure) ([place])    Depression    Diabetes mellitus    Migraine    Schizophrenia ([place])     Past Surgical History:  Procedure Laterality Date   KNEE ARTHROSCOPY     x 2   KNEE SURGERY Left    Incision made and knee cleaned out   TUBAL LIGATION         Inpatient Medications: Scheduled Meds:  [START ON 08/28/2022] aspirin  EC  81 mg Oral Daily   atorvastatin  20 mg Oral Daily   bumetanide  2 mg Oral BID   insulin glargine-yfgn  20 Units Subcutaneous Daily   isosorbide mononitrate  60 mg Oral Daily   metoprolol succinate  25 mg Oral Daily   mometasone-formoterol  2 puff Inhalation BID   risperiDONE  1-2 mg Oral See admin instructions   Continuous Infusions:  heparin 900 Units/hr (08/27/22 0123)   PRN Meds: acetaminophen, morphine (PF), nitroGLYCERIN, nitroGLYCERIN, ondansetron (ZOFRAN) IV  Allergies:    Allergies  Allergen Reactions   Tramadol Nausea Only    "feeling like I'm on fire"   Wellbutrin [Bupropion] Other (See Comments)    rage   Nicotine Rash    Patient states she is allergic to the Nicotine patch and they cause her to break out in a rash.  She states she can smoke cigarettes.    Social History:   Social History   Socioeconomic History   Marital status: Divorced    Spouse name: Not on file   Number of children: Not on file   Years of education: Not on file   Highest education level: Not on file  Occupational History   Not on file  Tobacco Use   Smoking status: Every Day    Packs/day: 1.00  Years: 29.00    Additional pack years: 0.00    Total pack years: 29.00    Types: Cigarettes   Smokeless tobacco: Never  Vaping Use   Vaping Use: Former  Substance and Sexual Activity   Alcohol use: Not Currently    Comment: occasionally   Drug use: No   Sexual activity: Yes    Birth control/protection: Surgical  Other Topics Concern   Not on file  Social History Narrative   Patient [place] in [name] with her 2 kids- 70 in 43 years old.   She has total 4 kids.   She is a single mom.         Social Determinants of Health   Financial Resource Strain: Low Risk  (05/16/2022)   Overall Financial Resource Strain (CARDIA)    Difficulty of Paying Living Expenses: Not very hard  Food Insecurity: Food Insecurity Present (08/21/2022)   Hunger Vital Sign    Worried About Running Out  of Food in the Last Year: Often true    Ran Out of Food in the Last Year: Often true  Transportation Needs: Unmet Transportation Needs (08/21/2022)   PRAPARE - Hydrologist (Medical): Yes    Lack of Transportation (Non-Medical): Yes  Physical Activity: Not on file  Stress: Not on file  Social Connections: Not on file  Intimate Partner Violence: Not At Risk (08/21/2022)   Humiliation, Afraid, Rape, and Kick questionnaire    Fear of Current or Ex-Partner: No    Emotionally Abused: No    Physically Abused: No    Sexually Abused: No    Family History:   Family History  Problem Relation Age of Onset   Bipolar disorder Mother    Hypertension Father    Diabetes Father      ROS:  Please see the history of present illness.  All other ROS reviewed and negative.     Physical Exam/Data:   Vitals:   08/27/22 0200 08/27/22 0300 08/27/22 0315 08/27/22 0400  BP: (!) 101/59 (!) 95/49  (!) 97/47  Pulse: 85   92  Resp: 17 18  17   Temp:      TempSrc:      SpO2: 96%  92% 95%  Weight:      Height:       No intake or output data in the 24 hours ending 08/27/22 0519    08/26/2022    9:24 PM 08/21/2022   12:03 AM 08/20/2022    1:59 PM  Last 3 Weights  Weight (lbs) 170 lb 177 lb 0.5 oz 177 lb  Weight (kg) 77.111 kg 80.3 kg 80.287 kg     Body mass index is 27.44 kg/m.  General:  Well nourished, well developed, in mild acute distress HEENT: normal Neck: no JVD Vascular: No carotid bruits; Distal pulses 2+ bilaterally Cardiac:  normal S1, S2; RRR; holosystolic murmur heard Lungs: Poor airflow Abd: soft, nontender, no hepatomegaly  Ext: Mild edema Musculoskeletal:  No deformities, BUE and BLE strength normal and equal Skin: warm and dry  Neuro:  CNs 2-12 intact, no focal abnormalities noted Psych:  Normal affect   EKG:  The EKG was personally reviewed and demonstrates:  no Ischemic changes  Relevant CV Studies:  ECG:NSR   ECHO: 05/15/22: LVEF < 20%, RV  mildly reduced; severe MR.   Laboratory Data:  High Sensitivity Troponin:   Recent Labs  Lab 08/14/22 1221 08/20/22 1404 08/20/22 1529 08/26/22 2225 08/27/22 0050  TROPONINIHS 50*  34* 31* 1,343* 1,214*     Chemistry Recent Labs  Lab 08/22/22 0331 08/23/22 1003 08/24/22 0359 08/26/22 2225 08/26/22 2257  NA 135 136 137 132* 134*  K 4.2 4.3 3.8 4.3 4.3  CL 102 104 99 91*  --   CO2 26 24 27 28   --   GLUCOSE 210* 129* 130* 523*  --   BUN 26* 17 23* 35*  --   CREATININE 1.14* 0.88 1.03* 1.34*  --   CALCIUM 8.2* 8.4* 8.5* 8.7*  --   MG 2.0  --  2.0  --   --   GFRNONAA 58* >60 >60 47*  --   ANIONGAP 7 8 11 13   --     Recent Labs  Lab 08/22/22 0331 08/24/22 0359 08/26/22 2225  PROT 5.4* 5.5* 6.2*  ALBUMIN 3.0* 3.0* 3.8  AST 11* 8* 26  ALT 18 18 27   ALKPHOS 83 73 84  BILITOT 0.6 0.6 1.1   Lipids  Recent Labs  Lab 08/27/22 0341  CHOL 172  TRIG 63  HDL 80  LDLCALC 79  CHOLHDL 2.2    Hematology Recent Labs  Lab 08/22/22 0331 08/24/22 0359 08/26/22 2225 08/26/22 2257  WBC 8.0 7.1 7.0  --   RBC 3.60* 3.87 3.91  --   HGB 10.4* 11.1* 11.3* 11.6*  HCT 32.2* 34.3* 34.1* 34.0*  MCV 89.4 88.6 87.2  --   MCH 28.9 28.7 28.9  --   MCHC 32.3 32.4 33.1  --   RDW 15.5 15.5 15.7*  --   PLT 176 165 190  --    Thyroid  Recent Labs  Lab 08/27/22 0341  TSH 0.259*    BNP Recent Labs  Lab 08/20/22 1404 08/26/22 2225 08/27/22 0341  BNP 368.1* 1,426.0* 1,118.0*    DDimer No results for input(s): "DDIMER" in the last 168 hours.   Radiology/Studies:  [facility] Chest Port 1 View  Result Date: 08/26/2022 CLINICAL DATA:  Chest pain and shortness of breath. EXAM: PORTABLE CHEST 1 VIEW COMPARISON:  August 20, 2022 FINDINGS: The cardiac silhouette is mildly enlarged and unchanged in size. Low lung volumes are seen, likely secondary to suboptimal patient inspiration. Mild atelectasis is noted within the right lung base. There is no evidence of a pleural effusion or  pneumothorax. The visualized skeletal structures are unremarkable. IMPRESSION: Low lung volumes with mild right basilar atelectasis. Electronically Signed   By: Virgina Norfolk M.D.   On: 08/26/2022 22:52   ECHOCARDIOGRAM COMPLETE  Result Date: 08/23/2022    ECHOCARDIOGRAM REPORT   Patient Name:   AMIRRAH SAVARESE Date of Exam: 08/23/2022 Medical Rec #:  [id number]       Height:       66.0 in Accession #:    [id number]      Weight:       177.0 lb Date of Birth:  June 25, 1970       BSA:          1.899 m Patient Age:    42 years        BP:           116/61 mmHg Patient Gender: F               HR:           95 bpm. Exam Location:  Inpatient Procedure: 2D Echo, Intracardiac Opacification Agent, Color Doppler and Cardiac            Doppler Indications:    CHF  History:        Patient has prior history of Echocardiogram examinations, most                 recent 05/15/2022. CHF, COPD; Risk Factors:Hypertension, Diabetes                 and Current Smoker.  Sonographer:    Marella Chimes Referring Phys: [place]  1. Left ventricular ejection fraction, by estimation, is <20%. The left ventricle has severely decreased function. The left ventricle demonstrates global hypokinesis. The left ventricular internal cavity size was severely dilated. Left ventricular diastolic parameters are indeterminate.  2. Right ventricular systolic function is normal. The right ventricular size is normal. Tricuspid regurgitation signal is inadequate for assessing PA pressure.  3. Left atrial size was moderately dilated.  4. The mitral valve is normal in structure. Moderate mitral valve regurgitation. No evidence of mitral stenosis.  5. The aortic valve is tricuspid. Aortic valve regurgitation is not visualized. No aortic stenosis is present.  6. The inferior vena cava is dilated in size with >50% respiratory variability, suggesting right atrial pressure of 8 mmHg. Comparison(s): No significant change from prior study.  Conclusion(s)/Recommendation(s): No left ventricular mural or apical thrombus/thrombi. FINDINGS  Left Ventricle: Left ventricular ejection fraction, by estimation, is <20%. The left ventricle has severely decreased function. The left ventricle demonstrates global hypokinesis. Definity contrast agent was given IV to delineate the left ventricular endocardial borders. The left ventricular internal cavity size was severely dilated. There is no left ventricular hypertrophy. Left ventricular diastolic parameters are indeterminate. Right Ventricle: The right ventricular size is normal. No increase in right ventricular wall thickness. Right ventricular systolic function is normal. Tricuspid regurgitation signal is inadequate for assessing PA pressure. Left Atrium: Left atrial size was moderately dilated. Right Atrium: Right atrial size was normal in size. Pericardium: There is no evidence of pericardial effusion. Mitral Valve: The mitral valve is normal in structure. Moderate mitral valve regurgitation. No evidence of mitral valve stenosis. Tricuspid Valve: The tricuspid valve is normal in structure. Tricuspid valve regurgitation is trivial. No evidence of tricuspid stenosis. Aortic Valve: The aortic valve is tricuspid. Aortic valve regurgitation is not visualized. No aortic stenosis is present. Aortic valve mean gradient measures 4.0 mmHg. Aortic valve peak gradient measures 11.0 mmHg. Aortic valve area, by VTI measures 1.38  cm. Pulmonic Valve: The pulmonic valve was not well visualized. Pulmonic valve regurgitation is not visualized. No evidence of pulmonic stenosis. Aorta: The aortic root, ascending aorta, aortic arch and descending aorta are all structurally normal, with no evidence of dilitation or obstruction. Venous: The inferior vena cava is dilated in size with greater than 50% respiratory variability, suggesting right atrial pressure of 8 mmHg. IAS/Shunts: The atrial septum is grossly normal.  LEFT VENTRICLE  PLAX 2D LVIDd:         6.90 cm   Diastology LVIDs:         6.60 cm   LV e' medial:    9.25 cm/s LV PW:         1.20 cm   LV E/e' medial:  10.4 LV IVS:        0.90 cm   LV e' lateral:   11.30 cm/s LVOT diam:     2.20 cm   LV E/e' lateral: 8.5 LV SV:         39 LV SV Index:   20 LVOT Area:     3.80 cm  RIGHT VENTRICLE  RV S prime:     10.90 cm/s TAPSE (M-mode): 2.1 cm LEFT ATRIUM              Index        RIGHT ATRIUM           Index LA diam:        5.10 cm  2.69 cm/m   RA Area:     12.20 cm LA Vol (A2C):   106.0 ml 55.83 ml/m  RA Volume:   28.70 ml  15.12 ml/m LA Vol (A4C):   68.5 ml  36.08 ml/m LA Biplane Vol: 89.7 ml  47.24 ml/m  AORTIC VALVE AV Area (Vmax):    1.33 cm AV Area (Vmean):   1.56 cm AV Area (VTI):     1.38 cm AV Vmax:           166.00 cm/s AV Vmean:          93.100 cm/s AV VTI:            0.280 m AV Peak Grad:      11.0 mmHg AV Mean Grad:      4.0 mmHg LVOT Vmax:         58.10 cm/s LVOT Vmean:        38.200 cm/s LVOT VTI:          0.102 m LVOT/AV VTI ratio: 0.36  AORTA Ao Root diam: 3.30 cm Ao Asc diam:  3.20 cm MITRAL VALVE MV Area (PHT): 3.35 cm    SHUNTS MV Decel Time: 227 msec    Systemic VTI:  0.10 m MR Peak grad: 75.0 mmHg    Systemic Diam: 2.20 cm MR Vmax:      433.00 cm/s MV E velocity: 96.55 cm/s MV A velocity: 96.40 cm/s MV E/A ratio:  1.00 Buford Dresser MD Electronically signed by Buford Dresser MD Signature Date/Time: 08/23/2022/4:55:05 PM    Final      Assessment and Plan:   # NSTEMI # Severely uncontrolled T2DM # Acute on chronic HFrEF # Severe functional MR # Homelessness  -Patient coming with SOB and significantly elevated troponin -She denies any chest pain- however with this amount of elevation of troponin without a clear cause + poor historian- will treat as NSTEMI -Continue with heparin drip -Aspirin 325 mg followed by Aspirin 81 mg daily -Atorvastatin 80 mg -LHC in AM most likely -Echo in AM -NPO  -Telemetry -BNP is >1k: will do IV  diuresis with IV lasix 80 mg BID -Strict I/O and K >4 and Mg >2 -BG significantly elevated to 500; per primary  Signed, Jaci Lazier, MD  08/27/2022 5:19 AM

## 2022-08-27 NOTE — H&P (Addendum)
History and Physical    [name] [name] [id number] DOB: 11/12/1970 DOA: 08/26/2022  PCP: Patient, No Pcp Per  Patient coming from:   I have personally briefly reviewed patient's old medical records in [facility]  Chief Complaint: sob/back pain/n  HPI: [name] [name] is a 52 y.o. female with medical history significant of   bipolar disorder, CHFref, uncontrolled diabetes, schizophrenia,HTN, CKDIIa, Asthma/COPD, homelessness, anxiety ,and migraines with interim history of recent admission 3/12-3/16 with diagnosis of AKI and COPD exacerbation.  who presents to ED with complaint of sob/back pain/ chest pressure and nausea. Per patient these symptoms have progressed over the past 24 hours. She notes no cough no fever , no chills ,and currently states she is chest pain free although she still noted mild discomfort of her back pain.  ED Course:  Vitals  Afeb, bp 112/64, hr 95  sat 87% rr 20  EKG: NSR LAE, RAD LVH, no hyperacute st-twave changes Labs glu 460  Resp panel: neg Wbc 7, hgb 11.3 , plt 190 Na 132 (137)  K 4.3,  glu 523, plt 1.34 ( 0.88)  [id number] Bnp 1118 Cxr IMPRESSION: Low lung volumes with mild right basilar atelectasis.   Vbg 7.3/43.8  Tx: insulin10 units, asa 324, heparin dri[ ,morphine   Review of Systems: As per HPI otherwise 10 point review of systems negative.   Past Medical History:  Diagnosis Date   Anxiety disorder    Asthma    Bipolar affective disorder ([place])    Blood transfusion without reported diagnosis    CHF (congestive heart failure) ([place])    Depression    Diabetes mellitus    Migraine    Schizophrenia ([place])     Past Surgical History:  Procedure Laterality Date   KNEE ARTHROSCOPY     x 2   KNEE SURGERY Left    Incision made and knee cleaned out   TUBAL LIGATION       reports that she has been smoking cigarettes. She has a 29.00 pack-year smoking history. She has never used smokeless tobacco. She reports that she does not  currently use alcohol. She reports that she does not use drugs.  Allergies  Allergen Reactions   Tramadol Nausea Only    "feeling like I'm on fire"   Wellbutrin [Bupropion] Other (See Comments)    rage   Nicotine Rash    Patient states she is allergic to the Nicotine patch and they cause her to break out in a rash.  She states she can smoke cigarettes.    Family History  Problem Relation Age of Onset   Bipolar disorder Mother    Hypertension Father    Diabetes Father     Prior to Admission medications   Medication Sig Start Date End Date Taking? Authorizing Provider  ASPIRIN LOW DOSE 81 MG tablet Take 1 tablet (81 mg total) by mouth daily. 08/23/22   Caren Griffins, MD  atorvastatin (LIPITOR) 20 MG tablet Take 1 tablet (20 mg total) by mouth daily. 08/23/22   Caren Griffins, MD  blood glucose meter kit and supplies Dispense based on patient and insurance preference. Use up to four times daily as directed. (FOR ICD-10 E10.9, E11.9). 01/06/18   Eugenie Filler, MD  bumetanide (BUMEX) 2 MG tablet Take 1 tablet (2 mg total) by mouth 2 (two) times daily. 08/23/22 09/22/22  Caren Griffins, MD  fluticasone-salmeterol (ADVAIR DISKUS) 250-50 MCG/ACT AEPB Inhale 1 puff into the lungs in the morning  and at bedtime. 08/23/22   Caren Griffins, MD  insulin glargine (LANTUS SOLOSTAR) 100 UNIT/ML Solostar Pen Inject 20 Units into the skin in the morning. Do not take if blood sugar is <100. 08/23/22   Gherghe, Vella Redhead, MD  isosorbide mononitrate (IMDUR) 60 MG 24 hr tablet Take 1 tablet (60 mg total) by mouth daily. 08/23/22 09/22/22  Caren Griffins, MD  metoprolol succinate (TOPROL-XL) 25 MG 24 hr tablet Take 1 tablet (25 mg total) by mouth daily. 08/23/22 09/22/22  Caren Griffins, MD  nitroGLYCERIN (NITROSTAT) 0.4 MG SL tablet Place 1 tablet (0.4 mg total) under the tongue every 5 (five) minutes as needed for chest pain. 08/23/22   Caren Griffins, MD  oxyCODONE-acetaminophen (PERCOCET)  10-325 MG tablet Take 1 tablet by mouth every 4 (four) hours as needed for pain. 08/23/22   Caren Griffins, MD  potassium chloride SA (KLOR-CON M) 20 MEQ tablet Take 1 tablet (20 mEq total) by mouth daily. 08/23/22   Caren Griffins, MD  predniSONE (DELTASONE) 20 MG tablet Take 1 tablet (20 mg total) by mouth daily with breakfast. 08/24/22   Caren Griffins, MD  risperiDONE (RISPERDAL) 2 MG tablet Take one-half tablet in the morning and one tablet in the evening. 08/23/22   Caren Griffins, MD  benztropine (COGENTIN) 1 MG tablet Take 1 tablet (1 mg total) by mouth 2 (two) times daily. 12/23/18 06/04/20  Johnn Hai, MD  insulin aspart (NOVOLOG) 100 UNIT/ML injection Inject 0-9 Units into the skin 3 (three) times daily with meals. Patient not taking: Reported on 06/03/2020 12/16/18 06/04/20  Antonieta Pert, MD    Physical Exam: Vitals:   08/26/22 2124 08/27/22 0030 08/27/22 0100 08/27/22 0108  BP:  107/61 (!) 110/56   Pulse:  95 92   Resp:  (!) 21 18   Temp:    97.8 F (36.6 C)  TempSrc:    Oral  SpO2:  96% 96%   Weight: 77.1 kg     Height: 5\' 6"  (1.676 m)       Constitutional: NAD, calm, comfortable Vitals:   08/26/22 2124 08/27/22 0030 08/27/22 0100 08/27/22 0108  BP:  107/61 (!) 110/56   Pulse:  95 92   Resp:  (!) 21 18   Temp:    97.8 F (36.6 C)  TempSrc:    Oral  SpO2:  96% 96%   Weight: 77.1 kg     Height: 5\' 6"  (1.676 m)      Eyes: PERRL, lids and conjunctivae normal ENMT: Mucous membranes are moist. Posterior pharynx clear of any exudate or lesions.Normal dentition.  Neck: normal, supple, no masses, no thyromegaly Respiratory: clear to auscultation bilaterally, no wheezing, no crackles. Normal respiratory effort. No accessory muscle use.  Cardiovascular: Regular rate and rhythm, no murmurs / rubs / gallops. No extremity edema. 2+ pedal pulses.  Abdomen: no tenderness, no masses palpated. No hepatosplenomegaly. Bowel sounds positive.  Musculoskeletal: no clubbing /  cyanosis. No joint deformity upper and lower extremities. Good ROM, no contractures. Normal muscle tone.  Skin: no rashes, lesions, ulcers. No induration Neurologic: CN 2-12 grossly intact. Sensation intact, Strength 5/5 in all 4.  Psychiatric:  Alert and oriented x 3. Normal mood.    Labs on Admission: I have personally reviewed following labs and imaging studies  CBC: Recent Labs  Lab 08/20/22 2321 08/21/22 0121 08/22/22 0331 08/24/22 0359 08/26/22 2225 08/26/22 2257  WBC 7.8 6.1 8.0 7.1 7.0  --  NEUTROABS 7.3  --   --   --  5.8  --   HGB 11.6* 11.0* 10.4* 11.1* 11.3* 11.6*  HCT 36.2 34.4* 32.2* 34.3* 34.1* 34.0*  MCV 90.0 89.6 89.4 88.6 87.2  --   PLT 213 190 176 165 190  --    Basic Metabolic Panel: Recent Labs  Lab 08/21/22 0121 08/22/22 0331 08/23/22 1003 08/24/22 0359 08/26/22 2225 08/26/22 2257  NA 134* 135 136 137 132* 134*  K 4.1 4.2 4.3 3.8 4.3 4.3  CL 97* 102 104 99 91*  --   CO2 24 26 24 27 28   --   GLUCOSE 537* 210* 129* 130* 523*  --   BUN 33* 26* 17 23* 35*  --   CREATININE 1.60* 1.14* 0.88 1.03* 1.34*  --   CALCIUM 8.1* 8.2* 8.4* 8.5* 8.7*  --   MG  --  2.0  --  2.0  --   --    GFR: Estimated Creatinine Clearance: 50.9 mL/min (A) (by C-G formula based on SCr of 1.34 mg/dL (H)). Liver Function Tests: Recent Labs  Lab 08/20/22 2322 08/22/22 0331 08/24/22 0359 08/26/22 2225  AST 23 11* 8* 26  ALT 30 18 18 27   ALKPHOS 101 83 73 84  BILITOT 0.4 0.6 0.6 1.1  PROT 6.6 5.4* 5.5* 6.2*  ALBUMIN 3.8 3.0* 3.0* 3.8   Recent Labs  Lab 08/20/22 2322 08/26/22 2225  LIPASE 32 25   No results for input(s): "AMMONIA" in the last 168 hours. Coagulation Profile: No results for input(s): "INR", "PROTIME" in the last 168 hours. Cardiac Enzymes: No results for input(s): "CKTOTAL", "CKMB", "CKMBINDEX", "TROPONINI" in the last 168 hours. BNP (last 3 results) No results for input(s): "PROBNP" in the last 8760 hours. HbA1C: No results for input(s):  "HGBA1C" in the last 72 hours. CBG: Recent Labs  Lab 08/24/22 0132 08/24/22 0558 08/24/22 0823 08/24/22 1155 08/26/22 2124  GLUCAP 156* 112* 140* 182* 460*   Lipid Profile: No results for input(s): "CHOL", "HDL", "LDLCALC", "TRIG", "CHOLHDL", "LDLDIRECT" in the last 72 hours. Thyroid Function Tests: No results for input(s): "TSH", "T4TOTAL", "FREET4", "T3FREE", "THYROIDAB" in the last 72 hours. Anemia Panel: No results for input(s): "VITAMINB12", "FOLATE", "FERRITIN", "TIBC", "IRON", "RETICCTPCT" in the last 72 hours. Urine analysis:    Component Value Date/Time   COLORURINE YELLOW 07/21/2022 2223   APPEARANCEUR CLEAR 07/21/2022 2223   LABSPEC 1.016 07/21/2022 2223   PHURINE 5.0 07/21/2022 2223   GLUCOSEU 150 (A) 07/21/2022 2223   HGBUR NEGATIVE 07/21/2022 2223   HGBUR trace-intact 11/25/2008 1530   BILIRUBINUR NEGATIVE 07/21/2022 2223   KETONESUR NEGATIVE 07/21/2022 2223   PROTEINUR NEGATIVE 07/21/2022 2223   UROBILINOGEN 0.2 05/12/2014 2315   NITRITE NEGATIVE 07/21/2022 2223   LEUKOCYTESUR TRACE (A) 07/21/2022 2223    Radiological Exams on Admission: DG Chest Port 1 View  Result Date: 08/26/2022 CLINICAL DATA:  Chest pain and shortness of breath. EXAM: PORTABLE CHEST 1 VIEW COMPARISON:  August 20, 2022 FINDINGS: The cardiac silhouette is mildly enlarged and unchanged in size. Low lung volumes are seen, likely secondary to suboptimal patient inspiration. Mild atelectasis is noted within the right lung base. There is no evidence of a pleural effusion or pneumothorax. The visualized skeletal structures are unremarkable. IMPRESSION: Low lung volumes with mild right basilar atelectasis. Electronically Signed   By: Virgina Norfolk M.D.   On: 08/26/2022 22:52    EKG: Independently reviewed.   Assessment/Plan NSTEMI -admit to progressive care  -patient currently chest  pain free  -heparin drip  -asa325, mg x 1 given , continue with ad 81 daily -atorvastatin 80 mg  -f/u  on  lipid panel  Await cardiology recs    Acute on Chronic systolic heart failure -lasix iv bid  -cycle ce, echo in am  -ekg without hyperacute st -twave changes  -f/u await cardiology recs   AKI on CKDIIIa -cr 1.34 ( 0.88)  -presumed due to poor flow  -monitor CR on diuresis  iv lasix bid  DMII  -uncontrolled  -resumed long acting  -fs /iss  - pt this am with continue increase fs  -novolog 10x 1   -repeat labs ordered , further plans based on response  Copd -no acute exacerbation  -resume home dulera,neb prn   Bipolar d/o -resume risperidone   Hypertension   HLD -continue atorvastatin   Social  Homeless  Sw to follow    DVT prophylaxis: heparin drip Code Status:full/ as discussed per patient wishes in event of cardiac arrest  Family Communication: none at bedside Disposition Plan:patient  expected to be admitted greater than 2 midnights  Consults called: Cardiology Humphrey Rolls MD Admission status: progressive   Clance Boll MD   If 7PM-7AM, please contact night-coverage [email or web address] Password [email or web address]  08/27/2022, 1:58 AM

## 2022-08-27 NOTE — ED Notes (Signed)
ED TO INPATIENT HANDOFF REPORT  ED Nurse Name and Phone #: [phone]  S Name/Age/Gender [name] [name] 52 y.o. female Room/Bed: 003C/003C  Code Status   Code Status: Full Code  Home/SNF/Other Homeless Patient oriented to: x4 Is this baseline? Yes   Triage Complete: Triage complete  Chief Complaint NSTEMI (non-ST elevated myocardial infarction) [facility]) [I21.4]  Triage Note Ems from warming center. Ems states pt states blood sugar high along with cough, sob and nausea,was given solumedrol, 2 deonebs, zofran and 500 bolus ns in route. Pt was 92%ra before 2 deonebs now 100%ra after.   Allergies Allergies  Allergen Reactions   Tramadol Nausea Only and Rash   Wellbutrin [Bupropion] Other (See Comments)    rage   Nicotine Rash    Patient states she is allergic to the Nicotine patch and they cause her to break out in a rash.  She states she can smoke cigarettes.    Level of Care/Admitting Diagnosis ED Disposition     ED Disposition  Admit   Condition  --   [place]: [place] [[id number]]  Level of Care: Progressive [102]  Admit to Progressive based on following criteria: CARDIOVASCULAR & THORACIC of moderate stability with acute coronary syndrome symptoms/low risk myocardial infarction/hypertensive urgency/arrhythmias/heart failure potentially compromising stability and stable post cardiovascular intervention patients.  May admit patient to [facility] or [facility] if equivalent level of care is available:: No  Covid Evaluation: Confirmed COVID Negative  Diagnosis: NSTEMI (non-ST elevated myocardial infarction) [facility]) [id number]  Admitting Physician: Clance Boll [id number]  Attending Physician: Clance Boll [id number]  Certification:: I certify this patient will need inpatient services for at least 2 midnights  Estimated Length of Stay: 3          B Medical/Surgery History Past Medical History:  Diagnosis Date   Anxiety  disorder    Asthma    Bipolar affective disorder ([place])    Blood transfusion without reported diagnosis    CHF (congestive heart failure) ([place])    Depression    Diabetes mellitus    Migraine    Schizophrenia ([place])    Past Surgical History:  Procedure Laterality Date   KNEE ARTHROSCOPY     x 2   KNEE SURGERY Left    Incision made and knee cleaned out   TUBAL LIGATION       A IV Location/Drains/Wounds Patient Lines/Drains/Airways Status     Active Line/Drains/Airways     Name Placement date Placement time Site Days   Peripheral IV 08/26/22 20 G Anterior;Distal;Left Forearm 08/26/22  2100  Forearm  1   Peripheral IV 08/27/22 22 G 1" Anterior;Proximal;Right Forearm 08/27/22  0620  Forearm  less than 1   Peripheral IV 08/27/22 22 G Distal;Posterior;Right Forearm 08/27/22  0755  Forearm  less than 1   Wound / Incision (Open or Dehisced) 07/17/20 Skin tear Buttocks Mid 07/17/20  1928  Buttocks  771            Intake/Output Last 24 hours No intake or output data in the 24 hours ending 08/27/22 1105  Labs/Imaging Results for orders placed or performed during the hospital encounter of 08/26/22 (from the past 48 hour(s))  CBG monitoring, ED     Status: Abnormal   Collection Time: 08/26/22  9:24 PM  Result Value Ref Range   Glucose-Capillary 460 (H) 70 - 99 mg/dL    Comment: Glucose reference range applies only to samples taken after fasting for at  least 8 hours.  Resp panel by RT-PCR (RSV, Flu A&B, Covid) Anterior Nasal Swab     Status: None   Collection Time: 08/26/22 10:22 PM   Specimen: Anterior Nasal Swab  Result Value Ref Range   SARS Coronavirus 2 by RT PCR NEGATIVE NEGATIVE   Influenza A by PCR NEGATIVE NEGATIVE   Influenza B by PCR NEGATIVE NEGATIVE    Comment: (NOTE) The Xpert Xpress SARS-CoV-2/FLU/RSV plus assay is intended as an aid in the diagnosis of influenza from Nasopharyngeal swab specimens and should not be used as a sole basis for treatment. Nasal  washings and aspirates are unacceptable for Xpert Xpress SARS-CoV-2/FLU/RSV testing.  Fact Sheet for Patients: EntrepreneurPulse.com.au  Fact Sheet for Healthcare Providers: IncredibleEmployment.be  This test is not yet approved or cleared by the [place] FDA and has been authorized for detection and/or diagnosis of SARS-CoV-2 by FDA under an Emergency Use Authorization (EUA). This EUA will remain in effect (meaning this test can be used) for the duration of the COVID-19 declaration under Section 564(b)(1) of the Act, 21 U.S.C. section 360bbb-3(b)(1), unless the authorization is terminated or revoked.     Resp Syncytial Virus by PCR NEGATIVE NEGATIVE    Comment: (NOTE) Fact Sheet for Patients: EntrepreneurPulse.com.au  Fact Sheet for Healthcare Providers: IncredibleEmployment.be  This test is not yet approved or cleared by the [place] FDA and has been authorized for detection and/or diagnosis of SARS-CoV-2 by FDA under an Emergency Use Authorization (EUA). This EUA will remain in effect (meaning this test can be used) for the duration of the COVID-19 declaration under Section 564(b)(1) of the Act, 21 U.S.C. section 360bbb-3(b)(1), unless the authorization is terminated or revoked.  Performed at [facility], [place] [address], [place], [place] [address]   CBC with Differential     Status: Abnormal   Collection Time: 08/26/22 10:25 PM  Result Value Ref Range   WBC 7.0 4.0 - 10.5 K/uL   RBC 3.91 3.87 - 5.11 MIL/uL   Hemoglobin 11.3 (L) 12.0 - 15.0 g/dL   HCT 34.1 (L) 36.0 - 46.0 %   MCV 87.2 80.0 - 100.0 fL   MCH 28.9 26.0 - 34.0 pg   MCHC 33.1 30.0 - 36.0 g/dL   RDW 15.7 (H) 11.5 - 15.5 %   Platelets 190 150 - 400 K/uL   nRBC 0.0 0.0 - 0.2 %   Neutrophils Relative % 83 %   Neutro Abs 5.8 1.7 - 7.7 K/uL   Lymphocytes Relative 11 %   Lymphs Abs 0.7 0.7 - 4.0 K/uL   Monocytes  Relative 5 %   Monocytes Absolute 0.4 0.1 - 1.0 K/uL   Eosinophils Relative 1 %   Eosinophils Absolute 0.1 0.0 - 0.5 K/uL   Basophils Relative 0 %   Basophils Absolute 0.0 0.0 - 0.1 K/uL   Immature Granulocytes 0 %   Abs Immature Granulocytes 0.03 0.00 - 0.07 K/uL    Comment: Performed at [facility] [address] [place], [place] [address]  Comprehensive metabolic panel     Status: Abnormal   Collection Time: 08/26/22 10:25 PM  Result Value Ref Range   Sodium 132 (L) 135 - 145 mmol/L   Potassium 4.3 3.5 - 5.1 mmol/L   Chloride 91 (L) 98 - 111 mmol/L   CO2 28 22 - 32 mmol/L   Glucose, Bld 523 (HH) 70 - 99 mg/dL    Comment: CRITICAL RESULT CALLED TO, READ BACK BY AND VERIFIED WITH  ERIC TURNBOW RN 08/26/22 2319 M KOROLESKI Glucose reference range applies only to samples taken after fasting for at least 8 hours.    BUN 35 (H) 6 - 20 mg/dL   Creatinine, Ser 1.34 (H) 0.44 - 1.00 mg/dL   Calcium 8.7 (L) 8.9 - 10.3 mg/dL   Total Protein 6.2 (L) 6.5 - 8.1 g/dL   Albumin 3.8 3.5 - 5.0 g/dL   AST 26 15 - 41 U/L   ALT 27 0 - 44 U/L   Alkaline Phosphatase 84 38 - 126 U/L   Total Bilirubin 1.1 0.3 - 1.2 mg/dL   GFR, Estimated 47 (L) >60 mL/min    Comment: (NOTE) Calculated using the CKD-EPI Creatinine Equation (2021)    Anion gap 13 5 - 15    Comment: Performed at [facility], [place] [address] [place], [place] [address]  Lipase, blood     Status: None   Collection Time: 08/26/22 10:25 PM  Result Value Ref Range   Lipase 25 11 - 51 U/L    Comment: Performed at [facility] [address] [place], [place] [address]  Troponin I (High Sensitivity)     Status: Abnormal   Collection Time: 08/26/22 10:25 PM  Result Value Ref Range   Troponin I (High Sensitivity) 1,343 (HH) <18 ng/L    Comment: CRITICAL RESULT CALLED TO, READ BACK BY AND VERIFIED WITH ERIC TURNBOW RN 08/26/22 2319 Jerilynn Mages KOROLESKI (NOTE) Elevated high sensitivity troponin I (hsTnI) values and significant   changes across serial measurements may suggest ACS but many other  chronic and acute conditions are known to elevate hsTnI results.  Refer to the "Links" section for chest pain algorithms and additional  guidance. Performed at [facility], [place] [address] [place], [place] [address]   Brain natriuretic peptide     Status: Abnormal   Collection Time: 08/26/22 10:25 PM  Result Value Ref Range   B Natriuretic Peptide 1,426.0 (H) 0.0 - 100.0 pg/mL    Comment: Performed at [facility] [address] [place], [place] [address]  I-Stat venous blood gas, [facility], [facility], [facility])     Status: Abnormal   Collection Time: 08/26/22 10:57 PM  Result Value Ref Range   pH, Ven 7.429 7.25 - 7.43   pCO2, Ven 43.8 (L) 44 - 60 mmHg   pO2, Ven 53 (H) 32 - 45 mmHg   Bicarbonate 29.0 (H) 20.0 - 28.0 mmol/L   TCO2 30 22 - 32 mmol/L   O2 Saturation 88 %   Acid-Base Excess 4.0 (H) 0.0 - 2.0 mmol/L   Sodium 134 (L) 135 - 145 mmol/L   Potassium 4.3 3.5 - 5.1 mmol/L   Calcium, Ion 1.07 (L) 1.15 - 1.40 mmol/L   HCT 34.0 (L) 36.0 - 46.0 %   Hemoglobin 11.6 (L) 12.0 - 15.0 g/dL   Sample type VENOUS   Troponin I (High Sensitivity)     Status: Abnormal   Collection Time: 08/27/22 12:50 AM  Result Value Ref Range   Troponin I (High Sensitivity) 1,214 (HH) <18 ng/L    Comment: CRITICAL VALUE NOTED. VALUE IS CONSISTENT WITH PREVIOUSLY REPORTED/CALLED VALUE (NOTE) Elevated high sensitivity troponin I (hsTnI) values and significant  changes across serial measurements may suggest ACS but many other  chronic and acute conditions are known to elevate hsTnI results.  Refer to the "Links" section for chest pain algorithms and additional  guidance. Performed at [facility], [place]  [address], [place], [place] [address]   Heparin level (unfractionated)     Status: Abnormal   Collection Time: 08/27/22  3:41 AM  Result Value Ref Range   Heparin Unfractionated 0.26 (L) 0.30 - 0.70 IU/mL    Comment:  (NOTE) The clinical reportable range upper limit is being lowered to >1.10 to align with the FDA approved guidance for the current laboratory assay.  If heparin results are below expected values, and patient dosage has  been confirmed, suggest follow up testing of antithrombin III levels. Performed at [facility], [place] [address], [place], [place] [address]   TSH     Status: Abnormal   Collection Time: 08/27/22  3:41 AM  Result Value Ref Range   TSH 0.259 (L) 0.350 - 4.500 uIU/mL    Comment: Performed by a 3rd Generation assay with a functional sensitivity of <=0.01 uIU/mL. Performed at [facility], [place] [address], [place], [place] [address]   Brain natriuretic peptide     Status: Abnormal   Collection Time: 08/27/22  3:41 AM  Result Value Ref Range   B Natriuretic Peptide 1,118.0 (H) 0.0 - 100.0 pg/mL    Comment: Performed at [facility] [address], [place], [place] [address]  Lipid panel     Status: None   Collection Time: 08/27/22  3:41 AM  Result Value Ref Range   Cholesterol 172 0 - 200 mg/dL   Triglycerides 63 <150 mg/dL   HDL 80 >40 mg/dL   Total CHOL/HDL Ratio 2.2 RATIO   VLDL 13 0 - 40 mg/dL   LDL Cholesterol 79 0 - 99 mg/dL    Comment:        Total Cholesterol/HDL:CHD Risk Coronary Heart Disease Risk Table                     Men   Women  1/2 Average Risk   3.4   3.3  Average Risk       5.0   4.4  2 X Average Risk   9.6   7.1  3 X Average Risk  23.4   11.0        Use the calculated Patient Ratio above and the CHD Risk Table to determine the patient's CHD Risk.        ATP III CLASSIFICATION (LDL):  <100     mg/dL   Optimal  100-129  mg/dL   Near or Above                    Optimal  130-159  mg/dL   Borderline  160-189  mg/dL   High  >190     mg/dL   Very High Performed at [facility] [address], [place],  [address]   CBG monitoring, ED     Status: Abnormal   Collection Time: 08/27/22  6:37 AM  Result Value Ref  Range   Glucose-Capillary 521 (HH) 70 - 99 mg/dL    Comment: Glucose reference range applies only to samples taken after fasting for at least 8 hours.   Comment 1 Notify RN   CBG monitoring, ED     Status: Abnormal   Collection Time: 08/27/22  7:28 AM  Result Value Ref Range   Glucose-Capillary 541 (HH) 70 - 99 mg/dL    Comment: Glucose reference range applies only to samples taken after fasting for at least 8 hours.   Comment 1 Notify RN  Urinalysis, Routine w reflex microscopic -Urine, Unspecified Source     Status: Abnormal   Collection Time: 08/27/22  7:33 AM  Result Value Ref Range   Color, Urine YELLOW YELLOW   APPearance CLEAR CLEAR   Specific Gravity, Urine 1.021 1.005 - 1.030   pH 6.0 5.0 - 8.0   Glucose, UA >=500 (A) NEGATIVE mg/dL   Hgb urine dipstick NEGATIVE NEGATIVE   Bilirubin Urine NEGATIVE NEGATIVE   Ketones, ur NEGATIVE NEGATIVE mg/dL   Protein, ur NEGATIVE NEGATIVE mg/dL   Nitrite NEGATIVE NEGATIVE   Leukocytes,Ua NEGATIVE NEGATIVE   RBC / HPF 0-5 0 - 5 RBC/hpf   WBC, UA 0-5 0 - 5 WBC/hpf   Bacteria, UA RARE (A) NONE SEEN   Squamous Epithelial / HPF 0-5 0 - 5 /HPF    Comment: Performed at [facility], [address], [place], [place] [address]  T4, free     Status: Abnormal   Collection Time: 08/27/22  7:54 AM  Result Value Ref Range   Free T4 1.41 (H) 0.61 - 1.12 ng/dL    Comment: (NOTE) Biotin ingestion may interfere with free T4 tests. If the results are inconsistent with the TSH level, previous test results, or the clinical presentation, then consider biotin interference. If needed, order repeat testing after stopping biotin. Performed at [facility], [place] [address] [place], [place] [address]   Magnesium     Status: None   Collection Time: 08/27/22  7:54 AM  Result Value Ref Range   Magnesium 2.4 1.7 - 2.4 mg/dL    Comment: Performed at [facility] [address], [place], [place] [address]  I-Stat venous blood gas, ED      Status: Abnormal   Collection Time: 08/27/22  8:13 AM  Result Value Ref Range   pH, Ven 7.419 7.25 - 7.43   pCO2, Ven 40.0 (L) 44 - 60 mmHg   pO2, Ven 100 (H) 32 - 45 mmHg   Bicarbonate 25.9 20.0 - 28.0 mmol/L   TCO2 27 22 - 32 mmol/L   O2 Saturation 98 %   Acid-Base Excess 1.0 0.0 - 2.0 mmol/L   Sodium 131 (L) 135 - 145 mmol/L   Potassium 4.7 3.5 - 5.1 mmol/L   Calcium, Ion 1.08 (L) 1.15 - 1.40 mmol/L   HCT 37.0 36.0 - 46.0 %   Hemoglobin 12.6 12.0 - 15.0 g/dL   Sample type VENOUS   Heparin level (unfractionated)     Status: Abnormal   Collection Time: 08/27/22  8:30 AM  Result Value Ref Range   Heparin Unfractionated 0.12 (L) 0.30 - 0.70 IU/mL    Comment: (NOTE) The clinical reportable range upper limit is being lowered to >1.10 to align with the FDA approved guidance for the current laboratory assay.  If heparin results are below expected values, and patient dosage has  been confirmed, suggest follow up testing of antithrombin III levels. Performed at [facility], [place] [address], [place], [place] [address]   CBG monitoring, ED     Status: Abnormal   Collection Time: 08/27/22  9:07 AM  Result Value Ref Range   Glucose-Capillary 405 (H) 70 - 99 mg/dL    Comment: Glucose reference range applies only to samples taken after fasting for at least 8 hours.  CBG monitoring, ED     Status: Abnormal   Collection Time: 08/27/22 10:12 AM  Result Value Ref Range   Glucose-Capillary 441 (H) 70 - 99  mg/dL    Comment: Glucose reference range applies only to samples taken after fasting for at least 8 hours.   DG Chest Port 1 View  Result Date: 08/26/2022 CLINICAL DATA:  Chest pain and shortness of breath. EXAM: PORTABLE CHEST 1 VIEW COMPARISON:  August 20, 2022 FINDINGS: The cardiac silhouette is mildly enlarged and unchanged in size. Low lung volumes are seen, likely secondary to suboptimal patient inspiration. Mild atelectasis is noted within the right lung base. There is no  evidence of a pleural effusion or pneumothorax. The visualized skeletal structures are unremarkable. IMPRESSION: Low lung volumes with mild right basilar atelectasis. Electronically Signed   By: Virgina Norfolk M.D.   On: 08/26/2022 22:52    Pending Labs Unresulted Labs (From admission, onward)     Start     Ordered   08/28/22 0500  Heparin level (unfractionated)  Daily,   R     See Hyperspace for full Linked Orders Report.   08/27/22 0111   08/28/22 0500  CBC  Daily,   R     See Hyperspace for full Linked Orders Report.   08/27/22 0111   08/28/22 0500  CBC with Differential/Platelet  Daily,   R     Question:  Specimen collection method  Answer:  Lab=Lab collect   08/27/22 0800   08/28/22 0500  Brain natriuretic peptide  Daily,   R     Question:  Specimen collection method  Answer:  Lab=Lab collect   08/27/22 0800   08/28/22 0500  Magnesium  Daily,   R     Question:  Specimen collection method  Answer:  Lab=Lab collect   08/27/22 0800   08/28/22 0500  Comprehensive metabolic panel  Daily,   R     Question:  Specimen collection method  Answer:  Lab=Lab collect   08/27/22 0800   08/27/22 0941  Heparin level (unfractionated)  Once-Timed,   TIMED        08/27/22 0941   08/27/22 0500  Lipoprotein A (LPA)  Tomorrow morning,   R        08/27/22 0235   08/27/22 0234  Hemoglobin A1c  Once,   R        08/27/22 0235            Vitals/Pain Today's Vitals   08/27/22 0958 08/27/22 1034 08/27/22 1034 08/27/22 1100  BP:  134/76  125/76  Pulse:  (!) 105  98  Resp:  (!) 26  (!) 28  Temp: 98.4 F (36.9 C)     TempSrc: Oral     SpO2:  99%  98%  Weight:      Height:      PainSc:   10-Worst pain ever     Isolation Precautions No active isolations  Medications Medications  heparin ADULT infusion 100 units/mL (25000 units/28mL) (900 Units/hr Intravenous New Bag/Given 08/27/22 0123)  metoprolol succinate (TOPROL-XL) 24 hr tablet 25 mg (25 mg Oral Given 08/27/22 0912)   mometasone-formoterol (DULERA) 200-5 MCG/ACT inhaler 2 puff (2 puffs Inhalation Given 08/27/22 0908)  aspirin EC tablet 81 mg (has no administration in time range)  nitroGLYCERIN (NITROSTAT) SL tablet 0.4 mg (has no administration in time range)  acetaminophen (TYLENOL) tablet 650 mg (has no administration in time range)  ondansetron (ZOFRAN) injection 4 mg (has no administration in time range)  risperiDONE (RISPERDAL) tablet 1 mg (1 mg Oral Given 08/27/22 0908)    And  risperiDONE (RISPERDAL) tablet 2 mg (has no administration in  time range)  insulin regular, human (MYXREDLIN) 100 units/ 100 mL infusion (16 Units/hr Intravenous Rate/Dose Change 08/27/22 1020)  dextrose 50 % solution 0-50 mL (has no administration in time range)  isosorbide mononitrate (IMDUR) 24 hr tablet 15 mg (15 mg Oral Given 08/27/22 0907)  insulin glargine-yfgn (SEMGLEE) injection 15 Units (15 Units Subcutaneous Given 08/27/22 0758)  lactated ringers infusion ( Intravenous New Bag/Given 08/27/22 0800)  morphine (PF) 4 MG/ML injection 1 mg (1 mg Intravenous Given 08/27/22 1035)  rosuvastatin (CRESTOR) tablet 20 mg (20 mg Oral Given 08/27/22 0907)  insulin aspart (novoLOG) injection 10 Units (10 Units Intravenous Given 08/26/22 2345)  aspirin chewable tablet 324 mg (324 mg Oral Given 08/27/22 0056)  heparin bolus via infusion 3,000 Units (3,000 Units Intravenous Bolus from Bag 08/27/22 0124)  morphine (PF) 4 MG/ML injection 4 mg (4 mg Intravenous Given 08/27/22 0140)    Mobility walks     Focused Assessments Cardiac Assessment Handoff:  Cardiac Rhythm: Normal sinus rhythm Lab Results  Component Value Date   TROPONINI 0.04 (HH) 01/04/2018   Lab Results  Component Value Date   DDIMER 3.06 (H) 05/15/2022   Does the Patient currently have chest pain? No    R Recommendations: See Admitting Provider Note  Report given to:   Additional Notes: Pt is on insulin drip and Heparin. Pt is ambulatory to bathroom once she is  unhooked. Given morphine for chronic back pain at 1035

## 2022-08-27 NOTE — Evaluation (Signed)
Physical Therapy Evaluation Patient Details Name: [name] [name] MRN: [id number] DOB: 1970/07/10 Today's Date: 08/27/2022  History of Present Illness  52 y.o. female presents to [facility] hospital on 08/26/2022 with complaints of SOB, back pain, chest pressure and nausea. Pt recently admitted 3/12-3/16 with AKI and COPD exacerbation. Pt admitted for management of NSTEMI and hyperglycemia. PMH includes: nonischemic cardiomyopathy, severe MR, schizoaffective disorder, COPD/asthma, cigarette smoker, IIDM.  Clinical Impression  Pt presents to PT with deficits in activity tolerance and gait. Pt's deficits appear largely related to reports of chronic back pain. Pt is able to ambulate and perform ADLs without physical assistance at this time. Pt will benefit from continued acute PT services during admission in an effort to address back pain and to improve endurance. PT recommends discharge back to shelter when medically appropriate.       Recommendations for follow up therapy are one component of a multi-disciplinary discharge planning process, led by the attending physician.  Recommendations may be updated based on patient status, additional functional criteria and insurance authorization.  Follow Up Recommendations No PT follow up Can patient physically be transported by private vehicle: Yes    Assistance Recommended at Discharge PRN  Patient can return home with the following  Assist for transportation    Equipment Recommendations None recommended by PT  Recommendations for Other Services       Functional Status Assessment Patient has not had a recent decline in their functional status (appears near baseline, deficits appear related to chronic back pain)     Precautions / Restrictions Precautions Precautions: Fall Precaution Comments: chronic back pain Restrictions Weight Bearing Restrictions: No      Mobility  Bed Mobility                    Transfers Overall transfer level:  Independent Equipment used: None                    Ambulation/Gait Ambulation/Gait assistance: Modified independent (Device/Increase time) Gait Distance (Feet): 300 Feet Assistive device: None Gait Pattern/deviations: Step-through pattern Gait velocity: functional Gait velocity interpretation: 1.31 - 2.62 ft/sec, indicative of limited community ambulator   General Gait Details: slowed step-through gait, reduced stride length  Stairs            Wheelchair Mobility    Modified Rankin (Stroke Patients Only)       Balance Overall balance assessment: Needs assistance Sitting-balance support: No upper extremity supported, Feet supported Sitting balance-Leahy Scale: Good     Standing balance support: No upper extremity supported, During functional activity Standing balance-Leahy Scale: Good                               Pertinent Vitals/Pain Pain Assessment Pain Assessment: 0-10 Pain Score: 10-Worst pain ever Pain Location: low back Pain Descriptors / Indicators: Aching Pain Intervention(s):  (RN providing pain meds at end of session)    Home Living Family/patient expects to be discharged to:: Shelter/Homeless                   Additional Comments: pt reports she will return to shelter unless she can buy a car with her next paycheck    Prior Function Prior Level of Function : Independent/Modified Independent             Mobility Comments: pt reports some difficulty tolerating ambulation due to chronic back pain  Hand Dominance   Dominant Hand: Right    Extremity/Trunk Assessment   Upper Extremity Assessment Upper Extremity Assessment: Overall WFL for tasks assessed    Lower Extremity Assessment Lower Extremity Assessment: Generalized weakness    Cervical / Trunk Assessment Cervical / Trunk Assessment: Kyphotic  Communication   Communication: No difficulties  Cognition Arousal/Alertness: Awake/alert Behavior  During Therapy: WFL for tasks assessed/performed Overall Cognitive Status: Within Functional Limits for tasks assessed                                          General Comments General comments (skin integrity, edema, etc.): VSS on RA    Exercises     Assessment/Plan    PT Assessment Patient needs continued PT services  PT Problem List Decreased activity tolerance;Decreased balance;Decreased mobility;Cardiopulmonary status limiting activity;Pain       PT Treatment Interventions Gait training;Therapeutic activities;Balance training;Neuromuscular re-education;Patient/family education;Stair training    PT Goals (Current goals can be found in the Care Plan section)  Acute Rehab PT Goals Patient Stated Goal: to return to shleter or purchase a car PT Goal Formulation: With patient Time For Goal Achievement: 09/10/22 Potential to Achieve Goals: Fair Additional Goals Additional Goal #1: Pt will score >19/24 on the DGI to indicate a reduced risk for falls Additional Goal #2: Pt will score >45/56 on the BERG to indicate a reduced risk for falls    Frequency Min 2X/week     Co-evaluation               AM-PAC PT "6 Clicks" Mobility  Outcome Measure Help needed turning from your back to your side while in a flat bed without using bedrails?: None Help needed moving from lying on your back to sitting on the side of a flat bed without using bedrails?: None Help needed moving to and from a bed to a chair (including a wheelchair)?: None Help needed standing up from a chair using your arms (e.g., wheelchair or bedside chair)?: None Help needed to walk in hospital room?: A Little Help needed climbing 3-5 steps with a railing? : A Little 6 Click Score: 22    End of Session   Activity Tolerance: Patient tolerated treatment well Patient left: in chair;with nursing/sitter in room Nurse Communication: Mobility status PT Visit Diagnosis: Other abnormalities of gait and  mobility (R26.89);Pain Pain - part of body:  (chest, low back)    Time: 1029-1040 PT Time Calculation (min) (ACUTE ONLY): 11 min   Charges:   PT Evaluation $PT Eval Low Complexity: 1 Low          Zenaida Niece, PT, DPT Acute Rehabilitation Office [phone]   Zenaida Niece 08/27/2022, 10:58 AM

## 2022-08-27 NOTE — Progress Notes (Signed)
ANTICOAGULATION CONSULT NOTE - Follow Up Consult  Pharmacy Consult for heparin Indication: chest pain/ACS  Allergies  Allergen Reactions   Tramadol Nausea Only and Rash   Wellbutrin [Bupropion] Other (See Comments)    rage   Nicotine Rash    Patient states she is allergic to the Nicotine patch and they cause her to break out in a rash.  She states she can smoke cigarettes.   Patient Measurements: Height: 5\' 6"  (167.6 cm) Weight: 77.1 kg (170 lb) IBW/kg (Calculated) : 59.3 kg Heparin Dosing Weight: 75 kg  Vital Signs: Temp: 98.6 F (37 C) (03/19 1235) Temp Source: Oral (03/19 1235) BP: 121/73 (03/19 1235) Pulse Rate: 100 (03/19 1235)  Labs: Recent Labs    08/26/22 2225 08/26/22 2257 08/27/22 0050 08/27/22 0341 08/27/22 0813 08/27/22 0830 08/27/22 1725  HGB 11.3* 11.6*  --   --  12.6  --   --   HCT 34.1* 34.0*  --   --  37.0  --   --   PLT 190  --   --   --   --   --   --   HEPARINUNFRC  --   --   --  0.26*  --  0.12* 0.14*  CREATININE 1.34*  --   --   --   --   --   --   TROPONINIHS 1,343*  --  1,214*  --   --   --   --     Estimated Creatinine Clearance: 50.9 mL/min (A) (by C-G formula based on SCr of 1.34 mg/dL (H)).  Medications:  Infusions:   lactated ringers 75 mL/hr at 08/27/22 1403   Assessment: 52 yo admitted with hyperglycemia, cough, and shortness of breath found to have elevated troponin. Pharmacy consulted to dose heparin for chest pain/ACS. Heparin level today is subtherapeutic at 0.12, on 900 units/hr. Hgb 12.6, plt 190 stable.   Heparin level came back subtherapeutic this PM. However, the heparin order was dced by Rn for some reason. Plan is to continue heparin x 48 hrs. D/w Dr. [place] Crane and we will resume it.   Goal of Therapy:  Heparin level 0.3-0.7 units/ml Monitor platelets by anticoagulation protocol: Yes   Plan:  Heparin 3000 units bolus x1 Resume heparin 1150 units/hr. Check AM heparin level.  Daily CBC, heparin level. Monitor for  signs/symptoms of bleeding.   Onnie Boer, PharmD, BCIDP, AAHIVP, CPP Infectious Disease Pharmacist 08/27/2022 8:08 PM

## 2022-08-27 NOTE — Progress Notes (Addendum)
Brief cardiology progress note:  Seen this AM by Dr. Humphrey Rolls. Recommended consideration of LHC. Given that glucose remains >500, no chest pain, and no STEMI on ECG, would defer cath at this time. She is not unfortunately a candidate for advanced heart failure therapies per note 05/15/22. If she remains chest pain free, and hsTn improves with management of sugars, best option would be medical management and 48 hours of heparin.   Buford Dresser, MD, PhD, [facility] at [facility] at [facility] [address] [place], [place] [address] [phone]

## 2022-08-27 NOTE — Progress Notes (Signed)
ANTICOAGULATION CONSULT NOTE - Follow Up Consult  Pharmacy Consult for heparin Indication: chest pain/ACS  Allergies  Allergen Reactions   Tramadol Nausea Only and Rash   Wellbutrin [Bupropion] Other (See Comments)    rage   Nicotine Rash    Patient states she is allergic to the Nicotine patch and they cause her to break out in a rash.  She states she can smoke cigarettes.   Patient Measurements: Height: 5\' 6"  (167.6 cm) Weight: 77.1 kg (170 lb) IBW/kg (Calculated) : 59.3 kg Heparin Dosing Weight: 75 kg  Vital Signs: Temp: 98.4 F (36.9 C) (03/19 0958) Temp Source: Oral (03/19 0958) BP: 134/76 (03/19 1034) Pulse Rate: 105 (03/19 1034)  Labs: Recent Labs    08/26/22 2225 08/26/22 2257 08/27/22 0050 08/27/22 0341 08/27/22 0813 08/27/22 0830  HGB 11.3* 11.6*  --   --  12.6  --   HCT 34.1* 34.0*  --   --  37.0  --   PLT 190  --   --   --   --   --   HEPARINUNFRC  --   --   --  0.26*  --  0.12*  CREATININE 1.34*  --   --   --   --   --   TROPONINIHS 1,343*  --  1,214*  --   --   --    Estimated Creatinine Clearance: 50.9 mL/min (A) (by C-G formula based on SCr of 1.34 mg/dL (H)).  Medications:  Infusions:   heparin 900 Units/hr (08/27/22 0123)   insulin 16 Units/hr (08/27/22 1020)   lactated ringers 75 mL/hr at 08/27/22 0800   Assessment: 52 yo admitted with hyperglycemia, cough, and shortness of breath found to have elevated troponin. Pharmacy consulted to dose heparin for chest pain/ACS. Heparin level today is subtherapeutic at 0.12, on 900 units/hr. Hgb 12.6, plt 190 stable.   Goal of Therapy:  Heparin level 0.3-0.7 units/ml Monitor platelets by anticoagulation protocol: Yes   Plan:  Heparin 2250 units bolus x1 Increase heparin to 1150 units/hr. Check 6 hour heparin level.  Daily CBC, heparin level. Monitor for signs/symptoms of bleeding.   Niley Helbig 0000000 AM

## 2022-08-28 DIAGNOSIS — E1169 Type 2 diabetes mellitus with other specified complication: Secondary | ICD-10-CM | POA: Diagnosis not present

## 2022-08-28 DIAGNOSIS — F419 Anxiety disorder, unspecified: Secondary | ICD-10-CM

## 2022-08-28 DIAGNOSIS — I82890 Acute embolism and thrombosis of other specified veins: Secondary | ICD-10-CM

## 2022-08-28 DIAGNOSIS — Z609 Problem related to social environment, unspecified: Secondary | ICD-10-CM

## 2022-08-28 DIAGNOSIS — I214 Non-ST elevation (NSTEMI) myocardial infarction: Secondary | ICD-10-CM | POA: Diagnosis not present

## 2022-08-28 DIAGNOSIS — N179 Acute kidney failure, unspecified: Secondary | ICD-10-CM

## 2022-08-28 DIAGNOSIS — E785 Hyperlipidemia, unspecified: Secondary | ICD-10-CM

## 2022-08-28 DIAGNOSIS — I5042 Chronic combined systolic (congestive) and diastolic (congestive) heart failure: Secondary | ICD-10-CM

## 2022-08-28 DIAGNOSIS — J42 Unspecified chronic bronchitis: Secondary | ICD-10-CM

## 2022-08-28 DIAGNOSIS — N189 Chronic kidney disease, unspecified: Secondary | ICD-10-CM

## 2022-08-28 DIAGNOSIS — I5023 Acute on chronic systolic (congestive) heart failure: Secondary | ICD-10-CM | POA: Diagnosis not present

## 2022-08-28 LAB — COMPREHENSIVE METABOLIC PANEL
ALT: 20 U/L (ref 0–44)
AST: 14 U/L — ABNORMAL LOW (ref 15–41)
Albumin: 3.2 g/dL — ABNORMAL LOW (ref 3.5–5.0)
Alkaline Phosphatase: 71 U/L (ref 38–126)
Anion gap: 9 (ref 5–15)
BUN: 31 mg/dL — ABNORMAL HIGH (ref 6–20)
CO2: 28 mmol/L (ref 22–32)
Calcium: 8.4 mg/dL — ABNORMAL LOW (ref 8.9–10.3)
Chloride: 100 mmol/L (ref 98–111)
Creatinine, Ser: 1.21 mg/dL — ABNORMAL HIGH (ref 0.44–1.00)
GFR, Estimated: 54 mL/min — ABNORMAL LOW (ref >60)
Glucose, Bld: 227 mg/dL — ABNORMAL HIGH (ref 70–99)
Potassium: 4.1 mmol/L (ref 3.5–5.1)
Sodium: 137 mmol/L (ref 135–145)
Total Bilirubin: 0.5 mg/dL (ref 0.3–1.2)
Total Protein: 5.5 g/dL — ABNORMAL LOW (ref 6.5–8.1)

## 2022-08-28 LAB — CBC WITH DIFFERENTIAL/PLATELET
Abs Immature Granulocytes: 0.05 10*3/uL (ref 0.00–0.07)
Basophils Absolute: 0 10*3/uL (ref 0.0–0.1)
Basophils Relative: 1 %
Eosinophils Absolute: 0.1 10*3/uL (ref 0.0–0.5)
Eosinophils Relative: 1 %
HCT: 35.7 % — ABNORMAL LOW (ref 36.0–46.0)
Hemoglobin: 11.1 g/dL — ABNORMAL LOW (ref 12.0–15.0)
Immature Granulocytes: 1 %
Lymphocytes Relative: 27 %
Lymphs Abs: 1.8 10*3/uL (ref 0.7–4.0)
MCH: 28.2 pg (ref 26.0–34.0)
MCHC: 31.1 g/dL (ref 30.0–36.0)
MCV: 90.6 fL (ref 80.0–100.0)
Monocytes Absolute: 0.5 10*3/uL (ref 0.1–1.0)
Monocytes Relative: 7 %
Neutro Abs: 4.2 10*3/uL (ref 1.7–7.7)
Neutrophils Relative %: 63 %
Platelets: 164 10*3/uL (ref 150–400)
RBC: 3.94 MIL/uL (ref 3.87–5.11)
RDW: 15.6 % — ABNORMAL HIGH (ref 11.5–15.5)
WBC: 6.6 10*3/uL (ref 4.0–10.5)
nRBC: 0 % (ref 0.0–0.2)

## 2022-08-28 LAB — GLUCOSE, CAPILLARY
Glucose-Capillary: 200 mg/dL — ABNORMAL HIGH (ref 70–99)
Glucose-Capillary: 434 mg/dL — ABNORMAL HIGH (ref 70–99)
Glucose-Capillary: 297 mg/dL — ABNORMAL HIGH (ref 70–99)
Glucose-Capillary: 83 mg/dL (ref 70–99)

## 2022-08-28 LAB — HEPARIN LEVEL (UNFRACTIONATED): Heparin Unfractionated: 0.18 IU/mL — ABNORMAL LOW (ref 0.30–0.70)

## 2022-08-28 LAB — LIPOPROTEIN A (LPA): Lipoprotein (a): 200.1 nmol/L — ABNORMAL HIGH (ref <75.0)

## 2022-08-28 LAB — RAPID URINE DRUG SCREEN, HOSP PERFORMED
Amphetamines: NOT DETECTED
Barbiturates: NOT DETECTED
Benzodiazepines: NOT DETECTED
Cocaine: NOT DETECTED
Opiates: POSITIVE — AB
Tetrahydrocannabinol: NOT DETECTED

## 2022-08-28 LAB — MAGNESIUM: Magnesium: 2 mg/dL (ref 1.7–2.4)

## 2022-08-28 LAB — BRAIN NATRIURETIC PEPTIDE: B Natriuretic Peptide: 882.2 pg/mL — ABNORMAL HIGH (ref 0.0–100.0)

## 2022-08-28 MED ORDER — INSULIN ASPART 100 UNIT/ML IJ SOLN
4.0000 [IU] | Freq: Three times a day (TID) | INTRAMUSCULAR | Status: DC
  Administered 2022-08-28 – 2022-08-29 (×3): 4 [IU] via SUBCUTANEOUS

## 2022-08-28 MED ORDER — INSULIN GLARGINE-YFGN 100 UNIT/ML ~~LOC~~ SOLN
14.0000 [IU] | Freq: Two times a day (BID) | SUBCUTANEOUS | Status: DC
  Administered 2022-08-28 – 2022-08-29 (×2): 14 [IU] via SUBCUTANEOUS
  Filled 2022-08-28 (×3): qty 0.14

## 2022-08-28 NOTE — Assessment & Plan Note (Addendum)
Patient with no chest pain.  Echocardiogram with no wall motion abnormalities.   Plan to continue medical therapy with heparin for 72 hrs.  Continue pain control. Supplemental 02 per [place].

## 2022-08-28 NOTE — Inpatient Diabetes Management (Signed)
Inpatient Diabetes Program Recommendations  AACE/ADA: New Consensus Statement on Inpatient Glycemic Control (2015)  Target Ranges:  Prepandial:   less than 140 mg/dL      Peak postprandial:   less than 180 mg/dL (1-2 hours)      Critically ill patients:  140 - 180 mg/dL   Lab Results  Component Value Date   GLUCAP 434 (H) 08/28/2022   HGBA1C 11.0 (H) 08/27/2022     DM2, NSTEMI, hx of CHD, COPD, Homelessness.   Has a PCP appointment for 3/25 (was just [place] on 3/16)  Current orders for Inpatient glycemic control: Semglee 12 units bid, Novolog 0-15 units tid, 0-5 units hs correction  DM2, NSTEMI, hx of CHD, COPD, Homelessness.   Has a PCP appointment for 3/25 (was just [place] on 3/16) Diabetes coordinator spoke with patient during last admission and tried to find options for patient to keep up with her medications but patient stated issues with people stealing her medications and also cost.  While inpatient, please consider: -Increase Semglee to 14 units bid -Add Novolog 4 units tid meal coverage if eats 50%  Thank you, Bethena Roys E. Fielding Mault, RN, MSN, CDE  Diabetes Coordinator Inpatient Glycemic Control Team Team Pager [phone] (8am-5pm) 08/28/2022 1:55 PM

## 2022-08-28 NOTE — Assessment & Plan Note (Signed)
No clinical signs of acute exacerbation. Continue bronchodilator therapy.

## 2022-08-28 NOTE — Assessment & Plan Note (Addendum)
Limited echocardiogram with reduced LV systolic function EF 20 to 25%, internal cavity with moderate to severe dilatation, no regional wall abnormalities, RV systolic function is preserved, severe mitral regurgitation.  Clinically with no significant volume overload.   Plan to continue with metoprolol and isosorbide.  Possible start diuresis in am.

## 2022-08-28 NOTE — Assessment & Plan Note (Signed)
Uncontrolled hyperglycemia.   Plan to continue with basal insulin, glargine, increase to 14 unirs bid and add pre meal 4 units. Continue with insulin sliding scale for glucose cover and monitoring.

## 2022-08-28 NOTE — Progress Notes (Signed)
Heart Failure Navigator Progress Note  Assessed for Heart & Vascular TOC clinic readiness.  Patient does not meet criteria due to Advanced Heart Failure Team patient.   Navigator will sign off at this time.    Laketa Sandoz, BSN, RN Heart Failure Nurse Navigator Secure Chat Only   

## 2022-08-28 NOTE — Assessment & Plan Note (Signed)
Renal function with serum cr at 1,21 with K at 4,1 and serum bicarbonate at 28. Plan to continue close follow up renal function and electrolytes.  Avoid hypotension or nephrotoxic medications.

## 2022-08-28 NOTE — TOC Initial Note (Addendum)
Transition of Care [facility]) - Initial/Assessment Note    Patient Details  Name: [name] [name] MRN: [id number] Date of Birth: [birth year]  Transition of Care [facility]) CM/SW Contact:    Zenon Mayo, RN Phone Number: 08/28/2022, 4:02 PM  Clinical Narrative:                 Patient is homeless, she states she has a son who lives in [place], she can stay there about 2 days then she has to go to shelter or to a hotel to stay, when she gets her check it is only 900, and the hotel stay is 300.00.  she states her son will transport her at dc  if dc tomorrow.   She has Medicaid and her copay is only 4.00 for meds. .  She [facility].  NCM gave her shelter list as well.  Expected Discharge Plan: Homeless Shelter Barriers to Discharge: Homeless with medical needs   Patient Goals and CMS Choice Patient states their goals for this hospitalization and ongoing recovery are:: get better   Choice offered to / list presented to : NA      Expected Discharge Plan and Services   Discharge Planning Services: CM Consult Post Acute Care Choice: NA Living arrangements for the past 2 months: Homeless Shelter                 DME Arranged: N/A DME Agency: NA       HH Arranged: NA          Prior Living Arrangements/Services Living arrangements for the past 2 months: Homeless Shelter Lives with:: Other (Comment) (homeless) Patient language and need for interpreter reviewed:: Yes Do you feel safe going back to the place where you live?: Yes      Need for Family Participation in Patient Care: Yes (Comment) Care giver support system in place?: Yes (comment)   Criminal Activity/Legal Involvement Pertinent to Current Situation/Hospitalization: No - Comment as needed  Activities of Daily Living Home Assistive Devices/Equipment: None ADL Screening (condition at time of admission) Patient's cognitive ability adequate to safely complete  daily activities?: Yes Is the patient deaf or have difficulty hearing?: No Does the patient have difficulty seeing, even when wearing glasses/contacts?: No Does the patient have difficulty concentrating, remembering, or making decisions?: No Patient able to express need for assistance with ADLs?: Yes Does the patient have difficulty dressing or bathing?: No Independently performs ADLs?: Yes (appropriate for developmental age) Does the patient have difficulty walking or climbing stairs?: No Weakness of Legs: None Weakness of Arms/Hands: None  Permission Sought/Granted                  Emotional Assessment Appearance:: Appears stated age Attitude/Demeanor/Rapport: Engaged Affect (typically observed): Appropriate Orientation: : Oriented to Self, Oriented to Place, Oriented to  Time, Oriented to Situation Alcohol / Substance Use: Not Applicable Psych Involvement: No (comment)  Admission diagnosis:  Hyperglycemia [R73.9] NSTEMI (non-ST elevated myocardial infarction) [facility]) [I21.4] Patient Active Problem List   Diagnosis Date Noted   NSTEMI (non-ST elevated myocardial infarction) ([place]) 08/27/2022   AKI (acute kidney injury) ([place]) 08/21/2022   Superficial vein thrombosis 08/06/2022   Anxiety 08/05/2022   Heart failure ([place]) 08/04/2022   COPD (chronic obstructive pulmonary disease) ([place]) 08/04/2022   Acute kidney injury superimposed on chronic kidney disease ([place]) 08/04/2022   Acute on chronic systolic heart failure ([place]) 08/03/2022   Bronchitis 08/03/2022  CHF (congestive heart failure) ([place]) 08/03/2022   Acute on chronic systolic CHF (congestive heart failure) ([place]) 05/15/2022   Acute pyelonephritis 07/17/2020   Homelessness 06/12/2020   Bipolar 1 disorder ([place]) 12/18/2018   Suicide attempt ([place])    Overdose, intentional self-harm, initial encounter ([place]) 12/13/2018   Bipolar I disorder, single manic episode, severe, with psychosis ([place]) 06/09/2018   Bipolar disorder ([place])  06/09/2018   DKA, type 2 ([place]) 01/06/2018   Diabetic acidosis without coma ([place])    Adjustment disorder with depressed mood 01/04/2018   Oral thrush    Acute lower UTI    DKA, type 2, not at goal [facility]) 01/03/2018   Thrush 01/03/2018   DM (diabetes mellitus), type 2, uncontrolled 06/07/2017   Abscess of groin, right 06/07/2017   Abscess 06/07/2017   Bipolar disorder with moderate depression ([place]) 08/31/2015   Tobacco use disorder 08/28/2015   HTN (hypertension) 08/28/2015   Type 2 diabetes mellitus with hyperlipidemia ([place]) 08/28/2015   Hyponatremia 08/28/2015   T2DM (type 2 diabetes mellitus) ([place]) 10/26/2009   OBESITY 10/26/2009   Essential hypertension 10/26/2009   DISTURBANCE OF SKIN SENSATION 10/26/2009   Migraine headache 03/05/2007   PCP:  Patient, No Pcp Per Pharmacy:   [facility] [address] [place] [address] Phone: [phone] Fax: [phone]  [facility] [place], [place] [address] Phone: [phone] Fax: [phone]  [place], [place] [address] Phone: [phone] Fax: [phone]  [facility] [place], [place] AT [facility] [place] [address] Phone: [phone] Fax: [phone]     Social Determinants of Health (SDOH) Social History: [place]: Food Insecurity Present (08/27/2022)  Housing: High Risk (08/27/2022)  Transportation Needs: Unmet Transportation Needs (08/27/2022)  Utilities: At Risk (08/27/2022)  Alcohol Screen: Low Risk  (12/18/2018)  Depression (PHQ2-9): Medium Risk (07/30/2020)  Financial Resource Strain: Low Risk  (05/16/2022)  Tobacco Use: High Risk (08/27/2022)   SDOH Interventions:     Readmission Risk  Interventions    08/28/2022    3:52 PM 08/22/2022    3:19 PM 08/06/2022   12:11 PM  Readmission Risk Prevention Plan  Transportation Screening Complete Complete Complete  Medication Review (RN Care Manager) Complete Complete Referral to Pharmacy  PCP or Specialist appointment within 3-5 days of discharge Complete Complete   HRI or [place] Complete Complete Complete  SW Recovery Care/Counseling Consult  Complete Complete  Palliative Care Screening Not Applicable Not Applicable Not [place] Not Applicable Not Applicable Not Applicable

## 2022-08-28 NOTE — Progress Notes (Signed)
ANTICOAGULATION CONSULT NOTE - Follow Up Consult  Pharmacy Consult for heparin Indication: chest pain/ACS  Allergies  Allergen Reactions   Tramadol Nausea Only and Rash   Wellbutrin [Bupropion] Other (See Comments)    rage   Nicotine Rash    Patient states she is allergic to the Nicotine patch and they cause her to break out in a rash.  She states she can smoke cigarettes.   Patient Measurements: Height: 5\' 6"  (167.6 cm) Weight: 82.1 kg (181 lb) IBW/kg (Calculated) : 59.3 kg Heparin Dosing Weight: 75 kg  Vital Signs: Temp: 98 F (36.7 C) (03/20 0451) Temp Source: Oral (03/20 0451) BP: 131/76 (03/20 0451) Pulse Rate: 96 (03/20 0451)  Labs: Recent Labs    08/26/22 2225 08/26/22 2257 08/27/22 0050 08/27/22 0341 08/27/22 0813 08/27/22 0830 08/27/22 1725 08/28/22 0645  HGB 11.3* 11.6*  --   --  12.6  --   --  11.1*  HCT 34.1* 34.0*  --   --  37.0  --   --  35.7*  PLT 190  --   --   --   --   --   --  164  HEPARINUNFRC  --   --   --    < >  --  0.12* 0.14* 0.18*  CREATININE 1.34*  --   --   --   --   --   --  1.21*  TROPONINIHS 1,343*  --  1,214*  --   --   --   --   --    < > = values in this interval not displayed.    Estimated Creatinine Clearance: 58.1 mL/min (A) (by C-G formula based on SCr of 1.21 mg/dL (H)).  Medications:  Infusions:   heparin 1,150 Units/hr (08/27/22 2044)   Assessment: 52 yo admitted with hyperglycemia, cough, and shortness of breath found to have elevated troponin. Pharmacy consulted to dose heparin for chest pain/ACS. - follow up plan with cards team   Heparin level 0.18 subtherapeutic this AM despite heparin drip rate increase last pm to 1150 uts/hr . Cbc stable   Goal of Therapy:  Heparin level 0.3-0.7 units/ml Monitor platelets by anticoagulation protocol: Yes   Plan:  Increase heparin 1300 units/hr. Daily CBC, heparin level. Monitor for signs/symptoms of bleeding.    Bonnita Nasuti Pharm.D. CPP, BCPS Clinical  Pharmacist [phone] 08/28/2022 9:12 AM

## 2022-08-28 NOTE — Assessment & Plan Note (Signed)
Continue with risperidone.

## 2022-08-28 NOTE — Progress Notes (Signed)
Progress Note   Patient: [name] [name] [id number] DOB: 1971-04-16 DOA: 08/26/2022     1 DOS: the patient was seen and examined on 08/28/2022   Brief hospital course: [name] [name] was admitted to the hospital with the working diagnosis of NSTEMI.   52 yo female with the past medical history of hypertension, heart failure, uncontrolled T2DM, schizophrenia, hypertension, CKD and asthma who presented with dyspnea and chest pressure. Recent hospitalization 03/12 to 08/24/22, for COPD exacerbation.  At home she had progressive symptoms for 24 hrs, that prompted her to come to the ED. On her initial physical examination her blood pressure was 112/64. HR 95. RR 20 and 02 saturation 87%, lungs with no wheezing or rales, heart with S1 and S2 present and rhythmic, abdomen with no distention and no lower extremity edema   Na 132, K 4,3 CL 91, bicarbonate 28 glucose 523, bun 35 cr 1.34,  BNP 1,426 High sensitive troponin 1,343, 1,214  Wbc 7.0 hgb 11.3 plt 190  Sars covid 19 negative   Chest radiograph with cardiomegaly with no infiltrates, or effusions.   EKG 96 bpm, right axis deviation, normal intervals, sinus rhythm with bi-atrial enlargement with poor R R wave progression, V5 and V6 ST segment inversion and negative T wave. Positive LVH.   Patient was placed on IV heparin for anticoagulation.  For hyperglycemia, placed on insulin drip.   Assessment and Plan: * NSTEMI (non-ST elevated myocardial infarction) [facility]) Patient with no chest pain.  Echocardiogram with no wall motion abnormalities.   Plan to continue medical therapy with heparin for 72 hrs.  Continue pain control. Supplemental 02 per [place].   COPD (chronic obstructive pulmonary disease) (HCC) No clinical signs of acute exacerbation. Continue bronchodilator therapy and supplemental 02 per [place].   Acute on chronic systolic heart failure (HCC) Limited echocardiogram with reduced LV systolic function EF 20 to 25%, internal cavity  with moderate to severe dilatation, no regional wall abnormalities, RV systolic function is preserved, severe mitral regurgitation.  Clinically with no significant volume overload.   Plan to continue with metoprolol and isosorbide.  Possible start diuresis in am.   Type 2 diabetes mellitus with hyperlipidemia (HCC) Uncontrolled hyperglycemia.   Plan to continue with basal insulin, glargine, increase to 14 unirs bid and add pre meal 4 units. Continue with insulin sliding scale for glucose cover and monitoring.   Acute kidney injury superimposed on chronic kidney disease ([place]) Renal function with serum cr at 1,21 with K at 4,1 and serum bicarbonate at 28. Plan to continue close follow up renal function and electrolytes.  Avoid hypotension or nephrotoxic medications.   Anxiety Continue with risperidone.   Superficial vein thrombosis Not on anticoagulation.         Subjective: Patient with improvement in dyspnea, no chest pain, no nausea or vomiting.  She needs refill for her insulin   Physical Exam: Vitals:   08/27/22 2105 08/28/22 0002 08/28/22 0451 08/28/22 1142  BP: (!) 107/50 126/70 131/76 122/77  Pulse: 100 92 96 91  Resp: 19 19 20 20   Temp: 98.6 F (37 C) 98 F (36.7 C) 98 F (36.7 C) 97.8 F (36.6 C)  TempSrc: Oral Oral Oral Oral  SpO2: 95% 93% 99% 95%  Weight:   82.1 kg   Height:       Neurology awake and alert ENT with mild pallor with no icterus Cardiovascular with S1 and S2 present and rhythmic with no gallops, or rubs, positive systolic murmur at the apex.  No JVD Respiratory with mild expiratory wheezing, no rhonchi or rales Trace lower extremity edema  Data Reviewed:    Family Communication: no family at the bedside   Disposition: Status is: Inpatient Remains inpatient appropriate because: IV heparin for NSTEMI  Planned Discharge Destination: Home    Author: Tawni Millers, MD 08/28/2022 2:21 PM  For on call review  [email or web address].

## 2022-08-28 NOTE — Hospital Course (Signed)
[name] [name] was admitted to the hospital with the working diagnosis of NSTEMI.   52 yo female with the past medical history of hypertension, heart failure, uncontrolled T2DM, schizophrenia, hypertension, CKD and asthma who presented with dyspnea and chest pressure. Recent hospitalization 03/12 to 08/24/22, for COPD exacerbation.  At home she had progressive symptoms for 24 hrs, that prompted her to come to the ED. On her initial physical examination her blood pressure was 112/64. HR 95. RR 20 and 02 saturation 87%, lungs with no wheezing or rales, heart with S1 and S2 present and rhythmic, abdomen with no distention and no lower extremity edema   Na 132, K 4,3 CL 91, bicarbonate 28 glucose 523, bun 35 cr 1.34,  BNP 1,426 High sensitive troponin 1,343, 1,214  Wbc 7.0 hgb 11.3 plt 190  Sars covid 19 negative   Chest radiograph with cardiomegaly with no infiltrates, or effusions.   EKG 96 bpm, right axis deviation, normal intervals, sinus rhythm with bi-atrial enlargement with poor R R wave progression, V5 and V6 ST segment inversion and negative T wave. Positive LVH.   Patient was placed on IV heparin for anticoagulation.  For hyperglycemia, placed on insulin drip.   Patient had no further chest pain. Decision was made for conservative treatment for acute coronary syndrome with IV heparin, statin and blood pressure control.  Limited echocardiogram with no wall motion abnormalities.   Plan for discharge patient home, instructions for close follow up.  Refilled diuretic and insulin therapy.

## 2022-08-28 NOTE — Assessment & Plan Note (Deleted)
Patient with no chest pain.  Limited echocardiogram with reduced LV systolic function EF 20 to 25%, internal cavity with moderate to severe dilatation, no regional wall abnormalities, RV systolic function is preserved, severe mitral regurgitation,   Plan to continue medical therapy with heparin for 72 hrs.  Continue pain control. Supplemental 02 per [employer].

## 2022-08-28 NOTE — Progress Notes (Signed)
Pt sugar 434, MD notified. 15 units of novolog given

## 2022-08-28 NOTE — Assessment & Plan Note (Signed)
Not on anticoagulation 

## 2022-08-28 NOTE — Progress Notes (Addendum)
Progress Note  Patient Name: [name] [name] Date of Encounter: 08/28/2022  Primary Cardiologist: None  Subjective   Patient sleeping soundly when I walked in, easily aroused from rest. Reports chronic back pain but no new chest pain. Breathing seems a little better. Asking if she had a heart attack.  Inpatient Medications    Scheduled Meds:  aspirin EC  81 mg Oral Daily   influenza vac split quadrivalent PF  0.5 mL Intramuscular Tomorrow-1000   insulin aspart  0-15 Units Subcutaneous TID WC   insulin aspart  0-5 Units Subcutaneous QHS   insulin glargine-yfgn  12 Units Subcutaneous BID   isosorbide mononitrate  15 mg Oral Daily   metoprolol succinate  25 mg Oral Daily   mometasone-formoterol  2 puff Inhalation BID   risperiDONE  1 mg Oral Daily   And   risperiDONE  2 mg Oral QHS   rosuvastatin  20 mg Oral Daily   Continuous Infusions:  heparin 1,150 Units/hr (08/27/22 2044)   PRN Meds: acetaminophen, dextrose, HYDROcodone-acetaminophen, nitroGLYCERIN, ondansetron (ZOFRAN) IV   Vital Signs    Vitals:   08/27/22 2014 08/27/22 2105 08/28/22 0002 08/28/22 0451  BP:  (!) 107/50 126/70 131/76  Pulse: 99 100 92 96  Resp: 16 19 19 20   Temp:  98.6 F (37 C) 98 F (36.7 C) 98 F (36.7 C)  TempSrc:  Oral Oral Oral  SpO2: 95% 95% 93% 99%  Weight:    82.1 kg  Height:        Intake/Output Summary (Last 24 hours) at 08/28/2022 0946 Last data filed at 08/28/2022 0806 Gross per 24 hour  Intake 1106.4 ml  Output 100 ml  Net 1006.4 ml      08/28/2022    4:51 AM 08/26/2022    9:24 PM 08/21/2022   12:03 AM  Last 3 Weights  Weight (lbs) 181 lb 170 lb 177 lb 0.5 oz  Weight (kg) 82.101 kg 77.111 kg 80.3 kg     Telemetry    NSR - Personally Reviewed  ECG    NSR 87bpm, nonspecific STTW changes, QTc 432ms - Personally Reviewed  Physical Exam   GEN: No acute distress.  HEENT: Normocephalic, atraumatic, sclera non-icteric. Neck: No JVD or bruits. Cardiac: RRR no  murmurs, rubs, or gallops.  Respiratory: Clear to auscultation bilaterally. Breathing is unlabored. GI: Soft, nontender, non-distended, BS +x 4. MS: no deformity. Extremities: No clubbing or cyanosis. No edema. Distal pedal pulses are 2+ and equal bilaterally. Neuro:  AAOx3. Follows commands. Psych:  Responds to questions appropriately with a normal affect.  Labs    High Sensitivity Troponin:   Recent Labs  Lab 08/14/22 1221 08/20/22 1404 08/20/22 1529 08/26/22 2225 08/27/22 0050  TROPONINIHS 50* 34* 31* 1,343* 1,214*      Cardiac EnzymesNo results for input(s): "TROPONINI" in the last 168 hours. No results for input(s): "TROPIPOC" in the last 168 hours.   Chemistry Recent Labs  Lab 08/24/22 0359 08/26/22 2225 08/26/22 2257 08/27/22 0813 08/28/22 0645  NA 137 132* 134* 131* 137  K 3.8 4.3 4.3 4.7 4.1  CL 99 91*  --   --  100  CO2 27 28  --   --  28  GLUCOSE 130* 523*  --   --  227*  BUN 23* 35*  --   --  31*  CREATININE 1.03* 1.34*  --   --  1.21*  CALCIUM 8.5* 8.7*  --   --  8.4*  PROT 5.5* 6.2*  --   --  5.5*  ALBUMIN 3.0* 3.8  --   --  3.2*  AST 8* 26  --   --  14*  ALT 18 27  --   --  20  ALKPHOS 73 84  --   --  71  BILITOT 0.6 1.1  --   --  0.5  GFRNONAA >60 47*  --   --  54*  ANIONGAP 11 13  --   --  9     Hematology Recent Labs  Lab 08/24/22 0359 08/26/22 2225 08/26/22 2257 08/27/22 0813 08/28/22 0645  WBC 7.1 7.0  --   --  6.6  RBC 3.87 3.91  --   --  3.94  HGB 11.1* 11.3* 11.6* 12.6 11.1*  HCT 34.3* 34.1* 34.0* 37.0 35.7*  MCV 88.6 87.2  --   --  90.6  MCH 28.7 28.9  --   --  28.2  MCHC 32.4 33.1  --   --  31.1  RDW 15.5 15.7*  --   --  15.6*  PLT 165 190  --   --  164    BNP Recent Labs  Lab 08/26/22 2225 08/27/22 0341 08/28/22 0645  BNP 1,426.0* 1,118.0* 882.2*     DDimer No results for input(s): "DDIMER" in the last 168 hours.   Radiology    ECHOCARDIOGRAM LIMITED  Result Date: 08/27/2022    ECHOCARDIOGRAM LIMITED REPORT    Patient Name:   [name] [name] Date of Exam: 08/27/2022 Medical Rec #:  [id number]       Height:       66.0 in Accession #:    [id number]      Weight:       170.0 lb Date of Birth:  June 12, 1970       BSA:          1.866 m Patient Age:    52 years        BP:           121/73 mmHg Patient Gender: F               HR:           103 bpm. Exam Location:  Inpatient Procedure: Limited Echo and Limited Color Doppler Indications:    NSTEMI I21.4  History:        Patient has prior history of Echocardiogram examinations, most                 recent 08/23/2022. CHF, Previous Myocardial Infarction, COPD;                 Risk Factors:Hypertension, Diabetes and Current Smoker.  Sonographer:    Ronny Flurry Referring Phys: [id number] [place]  1. Left ventricular ejection fraction, by estimation, is 20 to 25%. The left ventricle has severely decreased function. The left ventricle has no regional wall motion abnormalities. The left ventricular internal cavity size was moderately to severely dilated. Left ventricular diastolic function could not be evaluated.  2. Right ventricular systolic function is normal. The right ventricular size is normal.  3. The mitral valve is normal in structure. There is severe functional mitral valve regurgitation.  4. The aortic valve is normal in structure. Aortic valve regurgitation is not visualized. No aortic stenosis is present.  5. The inferior vena cava is normal in size with greater than 50% respiratory variability, suggesting right atrial pressure of 3 mmHg. FINDINGS  Left Ventricle: Left ventricular ejection fraction, by estimation, is 20 to  25%. The left ventricle has severely decreased function. The left ventricle has no regional wall motion abnormalities. The left ventricular internal cavity size was moderately to severely dilated. There is no left ventricular hypertrophy. Left ventricular diastolic function could not be evaluated. Right Ventricle: The right ventricular  size is normal. No increase in right ventricular wall thickness. Right ventricular systolic function is normal. Left Atrium: Left atrial size was normal in size. Right Atrium: Right atrial size was normal in size. Pericardium: There is no evidence of pericardial effusion. Mitral Valve: The mitral valve is normal in structure. Severe mitral valve regurgitation. No evidence of mitral valve stenosis. Tricuspid Valve: The tricuspid valve is normal in structure. Tricuspid valve regurgitation is not demonstrated. No evidence of tricuspid stenosis. Aortic Valve: The aortic valve is normal in structure. Aortic valve regurgitation is not visualized. No aortic stenosis is present. Pulmonic Valve: The pulmonic valve was normal in structure. Pulmonic valve regurgitation is not visualized. No evidence of pulmonic stenosis. Aorta: The aortic root is normal in size and structure. Venous: The inferior vena cava is normal in size with greater than 50% respiratory variability, suggesting right atrial pressure of 3 mmHg. IAS/Shunts: No atrial level shunt detected by color flow Doppler. LEFT VENTRICLE PLAX 2D LVIDd:         7.00 cm      Diastology LVIDs:         6.50 cm      LV e' medial:  10.30 cm/s LV PW:         1.00 cm      LV e' lateral: 11.80 cm/s LV IVS:        0.90 cm LVOT diam:     2.10 cm LVOT Area:     3.46 cm  LV Volumes (MOD) LV vol d, MOD A2C: 293.0 ml LV vol d, MOD A4C: 282.0 ml LV vol s, MOD A2C: 235.0 ml LV vol s, MOD A4C: 216.0 ml LV SV MOD A2C:     58.0 ml LV SV MOD A4C:     282.0 ml LV SV MOD BP:      64.5 ml RIGHT VENTRICLE             IVC RV S prime:     21.10 cm/s  IVC diam: 1.80 cm LEFT ATRIUM         Index LA diam:    5.30 cm 2.84 cm/m   AORTA Ao Root diam: 3.40 cm Ao Asc diam:  2.80 cm  SHUNTS Systemic Diam: 2.10 cm Aditya Sabharwal Electronically signed by Hebert Soho Signature Date/Time: 08/27/2022/4:54:00 PM    Final    DG Chest Port 1 View  Result Date: 08/26/2022 CLINICAL DATA:  Chest pain and  shortness of breath. EXAM: PORTABLE CHEST 1 VIEW COMPARISON:  August 20, 2022 FINDINGS: The cardiac silhouette is mildly enlarged and unchanged in size. Low lung volumes are seen, likely secondary to suboptimal patient inspiration. Mild atelectasis is noted within the right lung base. There is no evidence of a pleural effusion or pneumothorax. The visualized skeletal structures are unremarkable. IMPRESSION: Low lung volumes with mild right basilar atelectasis. Electronically Signed   By: Virgina Norfolk M.D.   On: 08/26/2022 22:52    Cardiac Studies   2D echo Limited 08/27/22    1. Left ventricular ejection fraction, by estimation, is 20 to 25%. The  left ventricle has severely decreased function. The left ventricle has no  regional wall motion abnormalities. The left ventricular internal cavity  size was moderately to severely  dilated. Left ventricular diastolic function could not be evaluated.   2. Right ventricular systolic function is normal. The right ventricular  size is normal.   3. The mitral valve is normal in structure. There is severe functional  mitral valve regurgitation.   4. The aortic valve is normal in structure. Aortic valve regurgitation is  not visualized. No aortic stenosis is present.   5. The inferior vena cava is normal in size with greater than 50%  respiratory variability, suggesting right atrial pressure of 3 mmHg.    Patient Profile     52 y.o. female with bipolar disorder, chronic HFrEF, NICM, normal coronaries 10/2020,  severe mitral regurgitation, schizoprenia, uncontrolled DM, suspected CKD stage 2-3a, depression with prior suicidal ideation, migraines, chronic pain disorder, tobacco abuse, prior suspected sleep apnea unwilling to wear BiPAP or CPAP, asthma vs COPD, medication nonadherence in the setting of social issues, chronic pain, homelessness. She has had multiple ER visits and admissions between multiple medical facilities, not felt to be a candidate for  advanced therapies due to ongoing smoking, psychosocial barriers, schizoaffective disorder, and chronic pain. She had prior admission for cardiogenic shock requiring pressors, inotropes and diuresis. Per notes hospice was previously suggested but patient was unable to receive because her daughter did not want nurses in the home. Recently discharged earlier this month after COPD exacerbation. [facility], received 569ml bolus, Solumedrol, Zofran, and nebs. Glucose was in the 500s. Troponin peaked at 1,343, prompting cardiology consult.   Assessment & Plan    1. Elevated troponin/possible NSTEMI, prior normal coronaries by catheterization 10/2020 - per Dr. Judeth Cornfield note, recommended for medical management given concomitant issues - plan 48 hours of heparin - inciting event not totally clear, EF remains low known from prior, will check UDS for completeness - remains on ASA 81mg , Imdur at lower dose than home dose, new Toprol, and rosuvastatin - will review further recs with MD - main issues appear to be social, very difficult situation  2. Acute on chronic HFrEF/NICM - unfortunately not a candidate for advanced therapies due to multiple reasons - prior home med list includes Bumex 2mg  BID, Imdur 60mg  daily - here, resumed on Imdur 15mg  with addition of Toprol 25mg  daily - with low EF previously requiring pressors/inotropes, need to be cautious with further BB titration - challenging candidate for meds that require lab f/u given disjointed care, nonadherence to follow - initially recommended for IV Lasix but did not receive, will review diuretic plan with MD  3. Possible hyperthyroidism - suppressed TSH, elevated FT4 - per medicine team  4. Severely uncontrolled DM with hyperglycemia - A1C 11 - per medicine team   For questions or updates, please contact [place] Please consult [email or web address] for contact info under  Cardiology/STEMI.  Signed, Charlie Pitter, PA-C 08/28/2022, 9:46 AM

## 2022-08-29 ENCOUNTER — Other Ambulatory Visit (HOSPITAL_COMMUNITY): Payer: Self-pay

## 2022-08-29 DIAGNOSIS — I214 Non-ST elevation (NSTEMI) myocardial infarction: Secondary | ICD-10-CM | POA: Diagnosis not present

## 2022-08-29 DIAGNOSIS — E0590 Thyrotoxicosis, unspecified without thyrotoxic crisis or storm: Secondary | ICD-10-CM | POA: Insufficient documentation

## 2022-08-29 DIAGNOSIS — R739 Hyperglycemia, unspecified: Secondary | ICD-10-CM

## 2022-08-29 DIAGNOSIS — J42 Unspecified chronic bronchitis: Secondary | ICD-10-CM | POA: Diagnosis not present

## 2022-08-29 DIAGNOSIS — I5023 Acute on chronic systolic (congestive) heart failure: Secondary | ICD-10-CM | POA: Diagnosis not present

## 2022-08-29 DIAGNOSIS — N179 Acute kidney failure, unspecified: Secondary | ICD-10-CM | POA: Diagnosis not present

## 2022-08-29 LAB — COMPREHENSIVE METABOLIC PANEL
ALT: 21 U/L (ref 0–44)
AST: 12 U/L — ABNORMAL LOW (ref 15–41)
Albumin: 2.9 g/dL — ABNORMAL LOW (ref 3.5–5.0)
Alkaline Phosphatase: 78 U/L (ref 38–126)
Anion gap: 5 (ref 5–15)
BUN: 27 mg/dL — ABNORMAL HIGH (ref 6–20)
CO2: 28 mmol/L (ref 22–32)
Calcium: 8.2 mg/dL — ABNORMAL LOW (ref 8.9–10.3)
Chloride: 101 mmol/L (ref 98–111)
Creatinine, Ser: 1.33 mg/dL — ABNORMAL HIGH (ref 0.44–1.00)
GFR, Estimated: 48 mL/min — ABNORMAL LOW (ref >60)
Glucose, Bld: 263 mg/dL — ABNORMAL HIGH (ref 70–99)
Potassium: 4.4 mmol/L (ref 3.5–5.1)
Sodium: 134 mmol/L — ABNORMAL LOW (ref 135–145)
Total Bilirubin: 0.5 mg/dL (ref 0.3–1.2)
Total Protein: 5.3 g/dL — ABNORMAL LOW (ref 6.5–8.1)

## 2022-08-29 LAB — MAGNESIUM: Magnesium: 2.2 mg/dL (ref 1.7–2.4)

## 2022-08-29 LAB — CBC WITH DIFFERENTIAL/PLATELET
Abs Immature Granulocytes: 0.08 10*3/uL — ABNORMAL HIGH (ref 0.00–0.07)
Basophils Absolute: 0 10*3/uL (ref 0.0–0.1)
Basophils Relative: 1 %
Eosinophils Absolute: 0.1 10*3/uL (ref 0.0–0.5)
Eosinophils Relative: 2 %
HCT: 32.5 % — ABNORMAL LOW (ref 36.0–46.0)
Hemoglobin: 10.4 g/dL — ABNORMAL LOW (ref 12.0–15.0)
Immature Granulocytes: 1 %
Lymphocytes Relative: 28 %
Lymphs Abs: 1.8 10*3/uL (ref 0.7–4.0)
MCH: 28.6 pg (ref 26.0–34.0)
MCHC: 32 g/dL (ref 30.0–36.0)
MCV: 89.3 fL (ref 80.0–100.0)
Monocytes Absolute: 0.5 10*3/uL (ref 0.1–1.0)
Monocytes Relative: 7 %
Neutro Abs: 4.1 10*3/uL (ref 1.7–7.7)
Neutrophils Relative %: 61 %
Platelets: 165 10*3/uL (ref 150–400)
RBC: 3.64 MIL/uL — ABNORMAL LOW (ref 3.87–5.11)
RDW: 15.5 % (ref 11.5–15.5)
WBC: 6.7 10*3/uL (ref 4.0–10.5)
nRBC: 0 % (ref 0.0–0.2)

## 2022-08-29 LAB — GLUCOSE, CAPILLARY
Glucose-Capillary: 178 mg/dL — ABNORMAL HIGH (ref 70–99)
Glucose-Capillary: 230 mg/dL — ABNORMAL HIGH (ref 70–99)
Glucose-Capillary: 346 mg/dL — ABNORMAL HIGH (ref 70–99)

## 2022-08-29 LAB — HEPARIN LEVEL (UNFRACTIONATED): Heparin Unfractionated: 0.2 IU/mL — ABNORMAL LOW (ref 0.30–0.70)

## 2022-08-29 MED ORDER — ACETAMINOPHEN 325 MG PO TABS
650.0000 mg | ORAL_TABLET | ORAL | 0 refills | Status: DC | PRN
  Filled 2022-08-29: qty 30, 3d supply, fill #0

## 2022-08-29 MED ORDER — METHIMAZOLE 5 MG PO TABS
5.0000 mg | ORAL_TABLET | Freq: Every day | ORAL | Status: DC
  Filled 2022-08-29: qty 1

## 2022-08-29 MED ORDER — LANTUS SOLOSTAR 100 UNIT/ML ~~LOC~~ SOPN
20.0000 [IU] | PEN_INJECTOR | Freq: Every morning | SUBCUTANEOUS | 0 refills | Status: DC
  Filled 2022-08-29 (×2): qty 6, 30d supply, fill #0

## 2022-08-29 MED ORDER — POTASSIUM CHLORIDE CRYS ER 20 MEQ PO TBCR
20.0000 meq | EXTENDED_RELEASE_TABLET | Freq: Every day | ORAL | 0 refills | Status: DC
  Filled 2022-08-29: qty 30, 30d supply, fill #0

## 2022-08-29 MED ORDER — ENOXAPARIN SODIUM 40 MG/0.4ML IJ SOSY: 40.0000 mg | PREFILLED_SYRINGE | INTRAMUSCULAR | Status: DC

## 2022-08-29 MED ORDER — BUMETANIDE 2 MG PO TABS
2.0000 mg | ORAL_TABLET | Freq: Two times a day (BID) | ORAL | 0 refills | Status: DC
  Filled 2022-08-29: qty 60, 30d supply, fill #0

## 2022-08-29 MED ORDER — INSULIN PEN NEEDLE 32G X 4 MM MISC
1.0000 | Freq: Every morning | 0 refills | Status: DC
  Filled 2022-08-29: qty 100, 90d supply, fill #0

## 2022-08-29 MED ORDER — METHIMAZOLE 5 MG PO TABS
5.0000 mg | ORAL_TABLET | Freq: Every day | ORAL | 0 refills | Status: DC
  Filled 2022-08-29: qty 21, 21d supply, fill #0

## 2022-08-29 MED ORDER — ROSUVASTATIN CALCIUM 20 MG PO TABS
20.0000 mg | ORAL_TABLET | Freq: Every day | ORAL | 0 refills | Status: DC
  Filled 2022-08-29: qty 30, 30d supply, fill #0

## 2022-08-29 NOTE — TOC Benefit Eligibility Note (Signed)
Patient Teacher, English as a foreign language completed.    The patient is currently admitted and upon discharge could be taking Lantus Pen.  The current 30 day co-pay is $4.00.   The patient is currently admitted and upon discharge could be taking Novolog Pen.  The current 30 day co-pay is $4.00.   The patient is currently admitted and upon discharge could be taking Levemir Pen.  The current 30 day co-pay is $4.00.   The patient is insured through [facility] Medicaid   This test claim was processed through [place] amounts may vary at other pharmacies due to pharmacy/plan contracts, or as the patient moves through the different stages of their insurance plan.  Lyndel Safe, [place] Patient Advocate Specialist [place] Patient Advocate Team Direct Number: [phone]  Fax: [phone]

## 2022-08-29 NOTE — Progress Notes (Signed)
Pharmacist Heart Failure Core Measure Documentation  Assessment: [name] [name] has an EF documented as 20%by ECHO.  Rationale: Heart failure patients with left ventricular systolic dysfunction (LVSD) and an EF < 40% should be prescribed an angiotensin converting enzyme inhibitor (ACEI) or angiotensin receptor blocker (ARB) at discharge unless a contraindication is documented in the medical record.  This patient is not currently on an ACEI or ARB for HF.  This note is being placed in the record in order to provide documentation that a contraindication to the use of these agents is present for this encounter.  ACE Inhibitor or Angiotensin Receptor Blocker is contraindicated (specify all that apply)  []   ACEI allergy AND ARB allergy []   Angioedema []   Moderate or severe aortic stenosis []   Hyperkalemia [x]   Hypotension []   Renal artery stenosis [x]   Worsening renal function, preexisting renal disease or dysfunction   Bonnita Nasuti Pharm.D. CPP, BCPS Clinical Pharmacist [phone] 08/29/2022 9:40 AM

## 2022-08-29 NOTE — Plan of Care (Signed)
Problem: Education: Goal: Understanding of cardiac disease, CV risk reduction, and recovery process will improve Outcome: Adequate for Discharge Goal: Individualized Educational Video(s) Outcome: Adequate for Discharge   Problem: Activity: Goal: Ability to tolerate increased activity will improve Outcome: Adequate for Discharge   Problem: Cardiac: Goal: Ability to achieve and maintain adequate cardiovascular perfusion will improve Outcome: Adequate for Discharge   Problem: Health Behavior/Discharge Planning: Goal: Ability to safely manage health-related needs after discharge will improve Outcome: Adequate for Discharge   Problem: Education: Goal: Ability to describe self-care measures that may prevent or decrease complications (Diabetes Survival Skills Education) will improve Outcome: Adequate for Discharge Goal: Individualized Educational Video(s) Outcome: Adequate for Discharge   Problem: Coping: Goal: Ability to adjust to condition or change in health will improve Outcome: Adequate for Discharge   Problem: Fluid Volume: Goal: Ability to maintain a balanced intake and output will improve Outcome: Adequate for Discharge   Problem: Health Behavior/Discharge Planning: Goal: Ability to identify and utilize available resources and services will improve Outcome: Adequate for Discharge Goal: Ability to manage health-related needs will improve Outcome: Adequate for Discharge   Problem: Metabolic: Goal: Ability to maintain appropriate glucose levels will improve Outcome: Adequate for Discharge   Problem: Nutritional: Goal: Maintenance of adequate nutrition will improve Outcome: Adequate for Discharge Goal: Progress toward achieving an optimal weight will improve Outcome: Adequate for Discharge   Problem: Skin Integrity: Goal: Risk for impaired skin integrity will decrease Outcome: Adequate for Discharge   Problem: Tissue Perfusion: Goal: Adequacy of tissue perfusion  will improve Outcome: Adequate for Discharge   Problem: Education: Goal: Ability to describe self-care measures that may prevent or decrease complications (Diabetes Survival Skills Education) will improve Outcome: Adequate for Discharge Goal: Individualized Educational Video(s) Outcome: Adequate for Discharge   Problem: Cardiac: Goal: Ability to maintain an adequate cardiac output will improve Outcome: Adequate for Discharge   Problem: Health Behavior/Discharge Planning: Goal: Ability to identify and utilize available resources and services will improve Outcome: Adequate for Discharge Goal: Ability to manage health-related needs will improve Outcome: Adequate for Discharge   Problem: Fluid Volume: Goal: Ability to achieve a balanced intake and output will improve Outcome: Adequate for Discharge   Problem: Metabolic: Goal: Ability to maintain appropriate glucose levels will improve Outcome: Adequate for Discharge   Problem: Nutritional: Goal: Maintenance of adequate nutrition will improve Outcome: Adequate for Discharge Goal: Maintenance of adequate weight for body size and type will improve Outcome: Adequate for Discharge   Problem: Respiratory: Goal: Will regain and/or maintain adequate ventilation Outcome: Adequate for Discharge   Problem: Urinary Elimination: Goal: Ability to achieve and maintain adequate renal perfusion and functioning will improve Outcome: Adequate for Discharge   Problem: Acute Rehab PT Goals(only PT should resolve) Goal: PT Additional Goal #1 Outcome: Adequate for Discharge Goal: PT Additional Goal #2 Outcome: Adequate for Discharge   Problem: Education: Goal: Knowledge of General Education information will improve Description: Including pain rating scale, medication(s)/side effects and non-pharmacologic comfort measures Outcome: Adequate for Discharge   Problem: Health Behavior/Discharge Planning: Goal: Ability to manage health-related  needs will improve Outcome: Adequate for Discharge   Problem: Clinical Measurements: Goal: Ability to maintain clinical measurements within normal limits will improve Outcome: Adequate for Discharge Goal: Will remain free from infection Outcome: Adequate for Discharge Goal: Diagnostic test results will improve Outcome: Adequate for Discharge Goal: Respiratory complications will improve Outcome: Adequate for Discharge Goal: Cardiovascular complication will be avoided Outcome: Adequate for Discharge   Problem: Activity: Goal: Risk  for activity intolerance will decrease Outcome: Adequate for Discharge   Problem: Nutrition: Goal: Adequate nutrition will be maintained Outcome: Adequate for Discharge   Problem: Coping: Goal: Level of anxiety will decrease Outcome: Adequate for Discharge   Problem: Elimination: Goal: Will not experience complications related to bowel motility Outcome: Adequate for Discharge Goal: Will not experience complications related to urinary retention Outcome: Adequate for Discharge   Problem: Pain Managment: Goal: General experience of comfort will improve Outcome: Adequate for Discharge   Problem: Safety: Goal: Ability to remain free from injury will improve Outcome: Adequate for Discharge   Problem: Skin Integrity: Goal: Risk for impaired skin integrity will decrease Outcome: Adequate for Discharge

## 2022-08-29 NOTE — TOC Transition Note (Signed)
Transition of Care [facility]) - CM/SW Discharge Note   Patient Details  Name: [name] [name] MRN: [id number] Date of Birth: November 16, 1970  [facility] [place] CM/SW Contact:  Zenon Mayo, RN Phone Number: 08/29/2022, 9:54 AM   Clinical Narrative:    Patient is for possible dc today, son will transport her home.     Final next level of care: Homeless Shelter Barriers to Discharge: Homeless with medical needs   Patient Goals and CMS Choice   Choice offered to / list presented to : NA  Discharge Placement                         Discharge Plan and Services Additional resources added to the After Visit Summary for     Discharge Planning Services: CM Consult Post Acute Care Choice: NA          DME Arranged: N/A DME Agency: NA       HH Arranged: NA          Social Determinants of Health (SDOH) Interventions SDOH Screenings   Food Insecurity: Food Insecurity Present (08/27/2022)  Housing: High Risk (08/27/2022)  Transportation Needs: Unmet Transportation Needs (08/27/2022)  Utilities: At Risk (08/27/2022)  Alcohol Screen: Low Risk  (12/18/2018)  Depression (PHQ2-9): Medium Risk (07/30/2020)  Financial Resource Strain: Low Risk  (05/16/2022)  Tobacco Use: High Risk (08/27/2022)     Readmission Risk Interventions    08/28/2022    3:52 PM 08/22/2022    3:19 PM 08/06/2022   12:11 PM  Readmission Risk Prevention Plan  Transportation Screening Complete Complete Complete  Medication Review (RN Care Manager) Complete Complete Referral to Pharmacy  PCP or Specialist appointment within 3-5 days of discharge Complete Complete   HRI or [place] Complete Complete Complete  SW Recovery Care/Counseling Consult  Complete Complete  Palliative Care Screening Not Applicable Not Applicable Not [place] Not Applicable Not Applicable Not Applicable

## 2022-08-29 NOTE — Progress Notes (Addendum)
Progress Note  Patient Name: [name] [name] Date of Encounter: 08/29/2022  Primary Cardiologist: Buford Dresser, MD  Subjective   No cardiac complaints today, only chronic back and hip pain. No CP. No SOB. Has her meds in a bag but bumex is old and faded. Unable to read the date.  Inpatient Medications    Scheduled Meds:  aspirin EC  81 mg Oral Daily   [START ON 08/30/2022] enoxaparin (LOVENOX) injection  40 mg Subcutaneous Q24H   influenza vac split quadrivalent PF  0.5 mL Intramuscular Tomorrow-1000   insulin aspart  0-15 Units Subcutaneous TID WC   insulin aspart  0-5 Units Subcutaneous QHS   insulin aspart  4 Units Subcutaneous TID WC   insulin glargine-yfgn  14 Units Subcutaneous BID   isosorbide mononitrate  15 mg Oral Daily   metoprolol succinate  25 mg Oral Daily   mometasone-formoterol  2 puff Inhalation BID   risperiDONE  1 mg Oral Daily   And   risperiDONE  2 mg Oral QHS   rosuvastatin  20 mg Oral Daily   Continuous Infusions:  PRN Meds: acetaminophen, dextrose, HYDROcodone-acetaminophen, nitroGLYCERIN, ondansetron (ZOFRAN) IV   Vital Signs    Vitals:   08/28/22 2358 08/29/22 0219 08/29/22 0442 08/29/22 0716  BP: (!) 101/53  108/61 128/77  Pulse: 89  93   Resp: 18  18 18   Temp: 98.1 F (36.7 C)  98.2 F (36.8 C) 98 F (36.7 C)  TempSrc: Oral  Oral Oral  SpO2: 96%  94% 99%  Weight:  83.6 kg    Height:        Intake/Output Summary (Last 24 hours) at 08/29/2022 1008 Last data filed at 08/29/2022 0819 Gross per 24 hour  Intake 1382.63 ml  Output 800 ml  Net 582.63 ml      08/29/2022    2:19 AM 08/28/2022    4:51 AM 08/26/2022    9:24 PM  Last 3 Weights  Weight (lbs) 184 lb 4.8 oz 181 lb 170 lb  Weight (kg) 83.598 kg 82.101 kg 77.111 kg     Telemetry    NSR - Personally Reviewed  ECG    No new tracings - Personally Reviewed  Physical Exam   GEN: No acute distress.  HEENT: Normocephalic, atraumatic, sclera non-icteric. Neck: No  JVD or bruits. Cardiac: RRR no murmurs, rubs, or gallops.  Respiratory: Clear to auscultation bilaterally. Breathing is unlabored. GI: Soft, nontender, non-distended, BS +x 4. MS: no deformity. Extremities: No clubbing or cyanosis. No edema. Distal pedal pulses are 2+ and equal bilaterally. Neuro:  AAOx3. Follows commands. Psych:  Responds to questions appropriately with a normal affect.  Labs    High Sensitivity Troponin:   Recent Labs  Lab 08/14/22 1221 08/20/22 1404 08/20/22 1529 08/26/22 2225 08/27/22 0050  TROPONINIHS 50* 34* 31* 1,343* 1,214*      Cardiac EnzymesNo results for input(s): "TROPONINI" in the last 168 hours. No results for input(s): "TROPIPOC" in the last 168 hours.   Chemistry Recent Labs  Lab 08/26/22 2225 08/26/22 2257 08/27/22 0813 08/28/22 0645 08/29/22 0029  NA 132*   < > 131* 137 134*  K 4.3   < > 4.7 4.1 4.4  CL 91*  --   --  100 101  CO2 28  --   --  28 28  GLUCOSE 523*  --   --  227* 263*  BUN 35*  --   --  31* 27*  CREATININE 1.34*  --   --  1.21* 1.33*  CALCIUM 8.7*  --   --  8.4* 8.2*  PROT 6.2*  --   --  5.5* 5.3*  ALBUMIN 3.8  --   --  3.2* 2.9*  AST 26  --   --  14* 12*  ALT 27  --   --  20 21  ALKPHOS 84  --   --  71 78  BILITOT 1.1  --   --  0.5 0.5  GFRNONAA 47*  --   --  54* 48*  ANIONGAP 13  --   --  9 5   < > = values in this interval not displayed.     Hematology Recent Labs  Lab 08/26/22 2225 08/26/22 2257 08/27/22 0813 08/28/22 0645 08/29/22 0029  WBC 7.0  --   --  6.6 6.7  RBC 3.91  --   --  3.94 3.64*  HGB 11.3*   < > 12.6 11.1* 10.4*  HCT 34.1*   < > 37.0 35.7* 32.5*  MCV 87.2  --   --  90.6 89.3  MCH 28.9  --   --  28.2 28.6  MCHC 33.1  --   --  31.1 32.0  RDW 15.7*  --   --  15.6* 15.5  PLT 190  --   --  164 165   < > = values in this interval not displayed.    BNP Recent Labs  Lab 08/26/22 2225 08/27/22 0341 08/28/22 0645  BNP 1,426.0* 1,118.0* 882.2*     DDimer No results for input(s):  "DDIMER" in the last 168 hours.   Radiology    ECHOCARDIOGRAM LIMITED  Result Date: 08/27/2022    ECHOCARDIOGRAM LIMITED REPORT   Patient Name:   [name] [name] Date of Exam: 08/27/2022 Medical Rec #:  [id number]       Height:       66.0 in Accession #:    [id number]      Weight:       170.0 lb Date of Birth:  1971-02-25       BSA:          1.866 m Patient Age:    52 years        BP:           121/73 mmHg Patient Gender: F               HR:           103 bpm. Exam Location:  Inpatient Procedure: Limited Echo and Limited Color Doppler Indications:    NSTEMI I21.4  History:        Patient has prior history of Echocardiogram examinations, most                 recent 08/23/2022. CHF, Previous Myocardial Infarction, COPD;                 Risk Factors:Hypertension, Diabetes and Current Smoker.  Sonographer:    Ronny Flurry Referring Phys: [id number] [place]  1. Left ventricular ejection fraction, by estimation, is 20 to 25%. The left ventricle has severely decreased function. The left ventricle has no regional wall motion abnormalities. The left ventricular internal cavity size was moderately to severely dilated. Left ventricular diastolic function could not be evaluated.  2. Right ventricular systolic function is normal. The right ventricular size is normal.  3. The mitral valve is normal in structure. There is severe functional mitral valve regurgitation.  4. The aortic valve  is normal in structure. Aortic valve regurgitation is not visualized. No aortic stenosis is present.  5. The inferior vena cava is normal in size with greater than 50% respiratory variability, suggesting right atrial pressure of 3 mmHg. FINDINGS  Left Ventricle: Left ventricular ejection fraction, by estimation, is 20 to 25%. The left ventricle has severely decreased function. The left ventricle has no regional wall motion abnormalities. The left ventricular internal cavity size was moderately to severely dilated. There  is no left ventricular hypertrophy. Left ventricular diastolic function could not be evaluated. Right Ventricle: The right ventricular size is normal. No increase in right ventricular wall thickness. Right ventricular systolic function is normal. Left Atrium: Left atrial size was normal in size. Right Atrium: Right atrial size was normal in size. Pericardium: There is no evidence of pericardial effusion. Mitral Valve: The mitral valve is normal in structure. Severe mitral valve regurgitation. No evidence of mitral valve stenosis. Tricuspid Valve: The tricuspid valve is normal in structure. Tricuspid valve regurgitation is not demonstrated. No evidence of tricuspid stenosis. Aortic Valve: The aortic valve is normal in structure. Aortic valve regurgitation is not visualized. No aortic stenosis is present. Pulmonic Valve: The pulmonic valve was normal in structure. Pulmonic valve regurgitation is not visualized. No evidence of pulmonic stenosis. Aorta: The aortic root is normal in size and structure. Venous: The inferior vena cava is normal in size with greater than 50% respiratory variability, suggesting right atrial pressure of 3 mmHg. IAS/Shunts: No atrial level shunt detected by color flow Doppler. LEFT VENTRICLE PLAX 2D LVIDd:         7.00 cm      Diastology LVIDs:         6.50 cm      LV e' medial:  10.30 cm/s LV PW:         1.00 cm      LV e' lateral: 11.80 cm/s LV IVS:        0.90 cm LVOT diam:     2.10 cm LVOT Area:     3.46 cm  LV Volumes (MOD) LV vol d, MOD A2C: 293.0 ml LV vol d, MOD A4C: 282.0 ml LV vol s, MOD A2C: 235.0 ml LV vol s, MOD A4C: 216.0 ml LV SV MOD A2C:     58.0 ml LV SV MOD A4C:     282.0 ml LV SV MOD BP:      64.5 ml RIGHT VENTRICLE             IVC RV S prime:     21.10 cm/s  IVC diam: 1.80 cm LEFT ATRIUM         Index LA diam:    5.30 cm 2.84 cm/m   AORTA Ao Root diam: 3.40 cm Ao Asc diam:  2.80 cm  SHUNTS Systemic Diam: 2.10 cm Aditya Sabharwal Electronically signed by Hebert Soho  Signature Date/Time: 08/27/2022/4:54:00 PM    Final     Cardiac Studies       2D echo Limited 08/27/22    1. Left ventricular ejection fraction, by estimation, is 20 to 25%. The  left ventricle has severely decreased function. The left ventricle has no  regional wall motion abnormalities. The left ventricular internal cavity  size was moderately to severely  dilated. Left ventricular diastolic function could not be evaluated.   2. Right ventricular systolic function is normal. The right ventricular  size is normal.   3. The mitral valve is normal in structure. There is severe  functional  mitral valve regurgitation.   4. The aortic valve is normal in structure. Aortic valve regurgitation is  not visualized. No aortic stenosis is present.   5. The inferior vena cava is normal in size with greater than 50%  respiratory variability, suggesting right atrial pressure of 3 mmHg.    Patient Profile     52 y.o. female with bipolar disorder, chronic HFrEF, NICM, normal coronaries 10/2020,  severe mitral regurgitation, schizoprenia, uncontrolled DM, suspected CKD stage 2-3a, depression with prior suicidal ideation, migraines, chronic pain disorder, tobacco abuse, prior suspected sleep apnea unwilling to wear BiPAP or CPAP, asthma vs COPD, medication nonadherence in the setting of social issues, chronic pain, homelessness. She has had multiple ER visits and admissions between multiple medical facilities, not felt to be a candidate for advanced therapies due to ongoing smoking, psychosocial barriers, schizoaffective disorder, and chronic pain. She had prior admission for cardiogenic shock requiring pressors, inotropes and diuresis. Per notes hospice was previously suggested but patient was unable to receive because her daughter did not want nurses in the home. Recently discharged earlier this month after COPD exacerbation. [facility], received 527ml  bolus, Solumedrol, Zofran, and nebs. Glucose was in the 500s. Troponin peaked at 1,343, prompting cardiology consult.   Assessment & Plan    1. Elevated troponin/possible NSTEMI, prior normal coronaries by catheterization 10/2020 - per Dr. Judeth Cornfield note, recommended for medical management given concomitant issues - completed 48 hours of heparin - inciting event not totally clear, EF remains low known from prior, UDS + opiates only - remains on ASA 81mg , Imdur at lower dose than home dose, new Toprol, and rosuvastatin - main issues appear to be social, very difficult situation   2. Acute on chronic HFrEF/NICM with severe functional mitral regurgitation - unfortunately not a candidate for advanced therapies due to multiple reasons, noncompliance, poor social situation - prior home med list includes Bumex 2mg  BID, Imdur 60mg  daily - here, resumed on Imdur 15mg  with addition of Toprol 25mg  daily - with low EF previously requiring pressors/inotropes, need to be cautious with further BB titration - challenging candidate for meds that require lab f/u given disjointed care, nonadherence to follow-up - may benefit from simplified regimen as much as possible to at least get her to comply with a basic regimen - initially recommended for IV Lasix but did not receive, volume status looks OK - will review resumption of diuretic regimen with MD, does not look like she was taking Bumex as OP as rx'd  3. Possible hyperthyroidism - suppressed TSH, elevated FT4 - per medicine team   4. Severely uncontrolled DM with hyperglycemia - has not had insulin in months - A1C 11 - per medicine team  She wants to f/u with [facility]. Will arrange f/u and place appt on AVS  For questions or updates, please contact [place] Please consult [email or web address] for contact info under Cardiology/STEMI.  Signed, Charlie Pitter, PA-C 08/29/2022, 10:08 AM

## 2022-08-29 NOTE — Progress Notes (Signed)
ANTICOAGULATION CONSULT NOTE - Follow Up Consult  Pharmacy Consult for heparin Indication: chest pain/ACS  Allergies  Allergen Reactions   Tramadol Nausea Only and Rash   Wellbutrin [Bupropion] Other (See Comments)    rage   Nicotine Rash    Patient states she is allergic to the Nicotine patch and they cause her to break out in a rash.  She states she can smoke cigarettes.   Patient Measurements: Height: 5\' 6"  (167.6 cm) Weight: 83.6 kg (184 lb 4.8 oz) IBW/kg (Calculated) : 59.3 kg Heparin Dosing Weight: 75 kg  Vital Signs: Temp: 98 F (36.7 C) (03/21 0716) Temp Source: Oral (03/21 0716) BP: 128/77 (03/21 0716) Pulse Rate: 93 (03/21 0442)  Labs: Recent Labs    08/26/22 2225 08/26/22 2257 08/27/22 0050 08/27/22 0341 08/27/22 0813 08/27/22 0830 08/27/22 1725 08/28/22 0645 08/29/22 0029  HGB 11.3*   < >  --   --  12.6  --   --  11.1* 10.4*  HCT 34.1*   < >  --   --  37.0  --   --  35.7* 32.5*  PLT 190  --   --   --   --   --   --  164 165  HEPARINUNFRC  --   --   --    < >  --    < > 0.14* 0.18* 0.20*  CREATININE 1.34*  --   --   --   --   --   --  1.21* 1.33*  TROPONINIHS 1,343*  --  1,214*  --   --   --   --   --   --    < > = values in this interval not displayed.    Estimated Creatinine Clearance: 53.3 mL/min (A) (by C-G formula based on SCr of 1.33 mg/dL (H)).  Medications:  Infusions:    Assessment: 52 yo admitted with hyperglycemia, cough, and shortness of breath found to have elevated troponin. Pharmacy consulted to dose heparin for chest pain/ACS. - follow up plan with cards team   Heparin level 0.2 subtherapeutic this AM despite heparin drip rate increase yesterday to 1300 uts/hr. Cbc stable. Chest pain improved. Per cardiology continue heparin x48hr post event and then will change to vte px  Goal of Therapy:  Heparin level 0.3-0.7 units/ml Monitor platelets by anticoagulation protocol: Yes   Plan:  Stop heparin drip  Enoxaparin 40mg  daily for  VTE px start tomorrow  Monitor cbc and renal function   Bonnita Nasuti Pharm.D. CPP, BCPS Clinical Pharmacist [phone] 08/29/2022 9:32 AM

## 2022-08-29 NOTE — Discharge Summary (Addendum)
Physician Discharge Summary   Patient: [name] [name] MRN: [id number] DOB: 1971/05/11  Admit date:     08/26/2022  Discharge date: 08/29/22  Discharge Physician: Jimmy Picket Serena Petterson   PCP: Patient, No Pcp Per   Recommendations at discharge:    Patient will continue bumetanide for diuresis, refilled medications Resume insulin long acting, refilled medication. Patient will follow up with primary care for further prescriptions for opoid analgesics. Added methimazole for hyperthyroid, will need follow up thyroid function testing in 3 weeks.    Discharge Diagnoses: Principal Problem:   NSTEMI (non-ST elevated myocardial infarction) ([place]) Active Problems:   Acute on chronic systolic heart failure (HCC)   COPD (chronic obstructive pulmonary disease) (HCC)   Type 2 diabetes mellitus with hyperlipidemia (HCC)   Acute kidney injury superimposed on chronic kidney disease ([place])   Anxiety   Superficial vein thrombosis  Resolved Problems:   * No resolved hospital problems. [facility] Course: [name] [name] was admitted to the hospital with the working diagnosis of NSTEMI.   52 yo female with the past medical history of hypertension, heart failure, uncontrolled T2DM, schizophrenia, hypertension, CKD and asthma who presented with dyspnea and chest pressure. Recent hospitalization 03/12 to 08/24/22, for COPD exacerbation.  At home she had progressive symptoms for 24 hrs, that prompted her to come to the ED. On her initial physical examination her blood pressure was 112/64. HR 95. RR 20 and 02 saturation 87%, lungs with no wheezing or rales, heart with S1 and S2 present and rhythmic, abdomen with no distention and no lower extremity edema   Na 132, K 4,3 CL 91, bicarbonate 28 glucose 523, bun 35 cr 1.34,  BNP 1,426 High sensitive troponin 1,343, 1,214  Wbc 7.0 hgb 11.3 plt 190  Sars covid 19 negative   Chest radiograph with cardiomegaly with no infiltrates, or effusions.   EKG 96 bpm,  right axis deviation, normal intervals, sinus rhythm with bi-atrial enlargement with poor R R wave progression, V5 and V6 ST segment inversion and negative T wave. Positive LVH.   Patient was placed on IV heparin for anticoagulation.  For hyperglycemia, placed on insulin drip.   Patient had no further chest pain. Decision was made for conservative treatment for acute coronary syndrome with IV heparin, statin and blood pressure control.  Limited echocardiogram with no wall motion abnormalities.   Plan for discharge patient home, instructions for close follow up.  Refilled diuretic and insulin therapy.    Assessment and Plan: * NSTEMI (non-ST elevated myocardial infarction) [facility]) Patient with no chest pain.  Echocardiogram with no wall motion abnormalities.   Patient had no further chest pain. Decision for conservative treatment.  Patient will continue statin therapy along with aspirin.  Close blood pressure control.   COPD (chronic obstructive pulmonary disease) (HCC) No clinical signs of acute exacerbation. Continue bronchodilator therapy.   Acute on chronic systolic heart failure (HCC) Limited echocardiogram with reduced LV systolic function EF 20 to 25%, internal cavity with moderate to severe dilatation, no regional wall abnormalities, RV systolic function is preserved, severe mitral regurgitation.  Clinically with no significant volume overload.   Plan to continue with metoprolol and isosorbide.  Continue diuresis with bumetanide.   Type 2 diabetes mellitus with hyperlipidemia (HCC) Uncontrolled hyperglycemia.   Patient will resume long acting insulin therapy at home with close follow up on capillary glucose.  Patient has been advised to be compliant with diabetic diet.  Will need close follow up as outpatient.   Continue  with statin therapy.   Acute kidney injury superimposed on chronic kidney disease (HCC) CKD 3a. Hyponatremia.   Patient had close monitoring of her  renal function, at the time of her discharge her serum cr is 1,33 with K at 4,4 and serum bicarbonate at 28.   Plan to continue diuresis with bumetanide and follow up renal function as outpatient.   Anxiety Continue with risperidone.   Superficial vein thrombosis Not on anticoagulation.   Hyperthyroidism TSH is 0,259 Free T 4 is 1,41   Will add low dose methimazole.  Will need outpatient follow up thyroid function testing in 3 weeks.          Consultants: cardiology  Procedures performed: none  Disposition: Home Diet recommendation:  Cardiac and Carb modified diet DISCHARGE MEDICATION: Allergies as of 08/29/2022       Reactions   Tramadol Nausea Only, Rash   Wellbutrin [bupropion] Other (See Comments)   rage   Nicotine Rash   Patient states she is allergic to the Nicotine patch and they cause her to break out in a rash.  She states she can smoke cigarettes.        Medication List     STOP taking these medications    predniSONE 20 MG tablet Commonly known as: DELTASONE       TAKE these medications    acetaminophen 325 MG tablet Commonly known as: TYLENOL Take 2 tablets (650 mg total) by mouth every 4 (four) hours as needed for headache or mild pain.   Aspirin Low Dose 81 MG tablet Generic drug: aspirin EC Take 1 tablet (81 mg total) by mouth daily.   atorvastatin 20 MG tablet Commonly known as: LIPITOR Take 1 tablet (20 mg total) by mouth daily.   blood glucose meter kit and supplies Dispense based on patient and insurance preference. Use up to four times daily as directed. (FOR ICD-10 E10.9, E11.9).   bumetanide 2 MG tablet Commonly known as: BUMEX Take 1 tablet (2 mg total) by mouth 2 (two) times daily.   fluticasone-salmeterol 250-50 MCG/ACT Aepb Commonly known as: Advair Diskus Inhale 1 puff into the lungs in the morning and at bedtime.   isosorbide mononitrate 60 MG 24 hr tablet Commonly known as: IMDUR Take 1 tablet (60 mg total) by  mouth daily.   Lantus SoloStar 100 UNIT/ML Solostar Pen Generic drug: insulin glargine Inject 20 Units into the skin in the morning. Do not take if blood sugar is <100.   methimazole 5 MG tablet Commonly known as: TAPAZOLE Take 1 tablet (5 mg total) by mouth daily for 21 days.   metoprolol succinate 25 MG 24 hr tablet Commonly known as: TOPROL-XL Take 1 tablet (25 mg total) by mouth daily.   nitroGLYCERIN 0.4 MG SL tablet Commonly known as: NITROSTAT Place 1 tablet (0.4 mg total) under the tongue every 5 (five) minutes as needed for chest pain.   oxyCODONE-acetaminophen 10-325 MG tablet Commonly known as: PERCOCET Take 1 tablet by mouth every 4 (four) hours as needed for pain.   potassium chloride SA 20 MEQ tablet Commonly known as: KLOR-CON M Take 1 tablet (20 mEq total) by mouth daily. Take with bumetanide. What changed: additional instructions   risperiDONE 2 MG tablet Commonly known as: RISPERDAL Take one-half tablet in the morning and one tablet in the evening.   rosuvastatin 20 MG tablet Commonly known as: CRESTOR Take 1 tablet (20 mg total) by mouth daily. Start taking on: August 30, 2022  Follow-up Information     Loel Dubonnet, NP Follow up.   Specialty: Cardiology Why: Cardiology follow-up arranged at [facility] at [facility] with our [facility] office - Monday September 16, 2022 at 2:20 PM (Arrive by 2:05 PM). Urban Gibson is one of our nurse practitioners that works with Dr. Harrell Gave. Contact information: [place] [address] [phone]                Discharge Exam: Filed Weights   08/26/22 2124 08/28/22 0451 08/29/22 0219  Weight: 77.1 kg 82.1 kg 83.6 kg   BP 128/77 (BP Location: Left Arm)   Pulse 93   Temp 98 F (36.7 C) (Oral)   Resp 18   Ht 5\' 6"  (1.676 m)   Wt 83.6 kg   SpO2 99%   BMI 29.75 kg/m   Patient is feeling better, no chest pain, no dyspnea.   Neurology awake  and alert ENT with mild pallor Cardiovascular with S1 and S2 present and rhythmic with no gallops, rubs or murmurs Respiratory with no rales or wheezing Abdomen with no distention  No lower extremity edema   Condition at discharge: stable  The results of significant diagnostics from this hospitalization (including imaging, microbiology, ancillary and laboratory) are listed below for reference.   Imaging Studies: ECHOCARDIOGRAM LIMITED  Result Date: 08/27/2022    ECHOCARDIOGRAM LIMITED REPORT   Patient Name:   [name] [name] Date of Exam: 08/27/2022 Medical Rec #:  [id number]       Height:       66.0 in Accession #:    [id number]      Weight:       170.0 lb Date of Birth:  [birth year]       BSA:          1.866 m Patient Age:    43 years        BP:           121/73 mmHg Patient Gender: F               HR:           103 bpm. Exam Location:  Inpatient Procedure: Limited Echo and Limited Color Doppler Indications:    NSTEMI I21.4  History:        Patient has prior history of Echocardiogram examinations, most                 recent 08/23/2022. CHF, Previous Myocardial Infarction, COPD;                 Risk Factors:Hypertension, Diabetes and Current Smoker.  Sonographer:    Ronny Flurry Referring Phys: [id number] [place]  1. Left ventricular ejection fraction, by estimation, is 20 to 25%. The left ventricle has severely decreased function. The left ventricle has no regional wall motion abnormalities. The left ventricular internal cavity size was moderately to severely dilated. Left ventricular diastolic function could not be evaluated.  2. Right ventricular systolic function is normal. The right ventricular size is normal.  3. The mitral valve is normal in structure. There is severe functional mitral valve regurgitation.  4. The aortic valve is normal in structure. Aortic valve regurgitation is not visualized. No aortic stenosis is present.  5. The inferior vena cava is normal in size  with greater than 50% respiratory variability, suggesting right atrial pressure of 3 mmHg. FINDINGS  Left Ventricle: Left ventricular ejection fraction, by estimation, is 20 to 25%.  The left ventricle has severely decreased function. The left ventricle has no regional wall motion abnormalities. The left ventricular internal cavity size was moderately to severely dilated. There is no left ventricular hypertrophy. Left ventricular diastolic function could not be evaluated. Right Ventricle: The right ventricular size is normal. No increase in right ventricular wall thickness. Right ventricular systolic function is normal. Left Atrium: Left atrial size was normal in size. Right Atrium: Right atrial size was normal in size. Pericardium: There is no evidence of pericardial effusion. Mitral Valve: The mitral valve is normal in structure. Severe mitral valve regurgitation. No evidence of mitral valve stenosis. Tricuspid Valve: The tricuspid valve is normal in structure. Tricuspid valve regurgitation is not demonstrated. No evidence of tricuspid stenosis. Aortic Valve: The aortic valve is normal in structure. Aortic valve regurgitation is not visualized. No aortic stenosis is present. Pulmonic Valve: The pulmonic valve was normal in structure. Pulmonic valve regurgitation is not visualized. No evidence of pulmonic stenosis. Aorta: The aortic root is normal in size and structure. Venous: The inferior vena cava is normal in size with greater than 50% respiratory variability, suggesting right atrial pressure of 3 mmHg. IAS/Shunts: No atrial level shunt detected by color flow Doppler. LEFT VENTRICLE PLAX 2D LVIDd:         7.00 cm      Diastology LVIDs:         6.50 cm      LV e' medial:  10.30 cm/s LV PW:         1.00 cm      LV e' lateral: 11.80 cm/s LV IVS:        0.90 cm LVOT diam:     2.10 cm LVOT Area:     3.46 cm  LV Volumes (MOD) LV vol d, MOD A2C: 293.0 ml LV vol d, MOD A4C: 282.0 ml LV vol s, MOD A2C: 235.0 ml LV vol  s, MOD A4C: 216.0 ml LV SV MOD A2C:     58.0 ml LV SV MOD A4C:     282.0 ml LV SV MOD BP:      64.5 ml RIGHT VENTRICLE             IVC RV S prime:     21.10 cm/s  IVC diam: 1.80 cm LEFT ATRIUM         Index LA diam:    5.30 cm 2.84 cm/m   AORTA Ao Root diam: 3.40 cm Ao Asc diam:  2.80 cm  SHUNTS Systemic Diam: 2.10 cm Aditya Sabharwal Electronically signed by Hebert Soho Signature Date/Time: 08/27/2022/4:54:00 PM    Final    DG Chest Port 1 View  Result Date: 08/26/2022 CLINICAL DATA:  Chest pain and shortness of breath. EXAM: PORTABLE CHEST 1 VIEW COMPARISON:  August 20, 2022 FINDINGS: The cardiac silhouette is mildly enlarged and unchanged in size. Low lung volumes are seen, likely secondary to suboptimal patient inspiration. Mild atelectasis is noted within the right lung base. There is no evidence of a pleural effusion or pneumothorax. The visualized skeletal structures are unremarkable. IMPRESSION: Low lung volumes with mild right basilar atelectasis. Electronically Signed   By: Virgina Norfolk M.D.   On: 08/26/2022 22:52   ECHOCARDIOGRAM COMPLETE  Result Date: 08/23/2022    ECHOCARDIOGRAM REPORT   Patient Name:   [name] [name] Date of Exam: 08/23/2022 Medical Rec #:  [id number]       Height:       66.0 in Accession #:  [id number]      Weight:       177.0 lb Date of Birth:  [birth year]       BSA:          1.899 m Patient Age:    47 years        BP:           116/61 mmHg Patient Gender: F               HR:           95 bpm. Exam Location:  Inpatient Procedure: 2D Echo, Intracardiac Opacification Agent, Color Doppler and Cardiac            Doppler Indications:    CHF  History:        Patient has prior history of Echocardiogram examinations, most                 recent 05/15/2022. CHF, COPD; Risk Factors:Hypertension, Diabetes                 and Current Smoker.  Sonographer:    Marella Chimes Referring Phys: [place]  1. Left ventricular ejection fraction, by estimation, is  <20%. The left ventricle has severely decreased function. The left ventricle demonstrates global hypokinesis. The left ventricular internal cavity size was severely dilated. Left ventricular diastolic parameters are indeterminate.  2. Right ventricular systolic function is normal. The right ventricular size is normal. Tricuspid regurgitation signal is inadequate for assessing PA pressure.  3. Left atrial size was moderately dilated.  4. The mitral valve is normal in structure. Moderate mitral valve regurgitation. No evidence of mitral stenosis.  5. The aortic valve is tricuspid. Aortic valve regurgitation is not visualized. No aortic stenosis is present.  6. The inferior vena cava is dilated in size with >50% respiratory variability, suggesting right atrial pressure of 8 mmHg. Comparison(s): No significant change from prior study. Conclusion(s)/Recommendation(s): No left ventricular mural or apical thrombus/thrombi. FINDINGS  Left Ventricle: Left ventricular ejection fraction, by estimation, is <20%. The left ventricle has severely decreased function. The left ventricle demonstrates global hypokinesis. Definity contrast agent was given IV to delineate the left ventricular endocardial borders. The left ventricular internal cavity size was severely dilated. There is no left ventricular hypertrophy. Left ventricular diastolic parameters are indeterminate. Right Ventricle: The right ventricular size is normal. No increase in right ventricular wall thickness. Right ventricular systolic function is normal. Tricuspid regurgitation signal is inadequate for assessing PA pressure. Left Atrium: Left atrial size was moderately dilated. Right Atrium: Right atrial size was normal in size. Pericardium: There is no evidence of pericardial effusion. Mitral Valve: The mitral valve is normal in structure. Moderate mitral valve regurgitation. No evidence of mitral valve stenosis. Tricuspid Valve: The tricuspid valve is normal in  structure. Tricuspid valve regurgitation is trivial. No evidence of tricuspid stenosis. Aortic Valve: The aortic valve is tricuspid. Aortic valve regurgitation is not visualized. No aortic stenosis is present. Aortic valve mean gradient measures 4.0 mmHg. Aortic valve peak gradient measures 11.0 mmHg. Aortic valve area, by VTI measures 1.38  cm. Pulmonic Valve: The pulmonic valve was not well visualized. Pulmonic valve regurgitation is not visualized. No evidence of pulmonic stenosis. Aorta: The aortic root, ascending aorta, aortic arch and descending aorta are all structurally normal, with no evidence of dilitation or obstruction. Venous: The inferior vena cava is dilated in size with greater than 50% respiratory variability, suggesting right atrial pressure of 8 mmHg. IAS/Shunts:  The atrial septum is grossly normal.  LEFT VENTRICLE PLAX 2D LVIDd:         6.90 cm   Diastology LVIDs:         6.60 cm   LV e' medial:    9.25 cm/s LV PW:         1.20 cm   LV E/e' medial:  10.4 LV IVS:        0.90 cm   LV e' lateral:   11.30 cm/s LVOT diam:     2.20 cm   LV E/e' lateral: 8.5 LV SV:         39 LV SV Index:   20 LVOT Area:     3.80 cm  RIGHT VENTRICLE RV S prime:     10.90 cm/s TAPSE (M-mode): 2.1 cm LEFT ATRIUM              Index        RIGHT ATRIUM           Index LA diam:        5.10 cm  2.69 cm/m   RA Area:     12.20 cm LA Vol (A2C):   106.0 ml 55.83 ml/m  RA Volume:   28.70 ml  15.12 ml/m LA Vol (A4C):   68.5 ml  36.08 ml/m LA Biplane Vol: 89.7 ml  47.24 ml/m  AORTIC VALVE AV Area (Vmax):    1.33 cm AV Area (Vmean):   1.56 cm AV Area (VTI):     1.38 cm AV Vmax:           166.00 cm/s AV Vmean:          93.100 cm/s AV VTI:            0.280 m AV Peak Grad:      11.0 mmHg AV Mean Grad:      4.0 mmHg LVOT Vmax:         58.10 cm/s LVOT Vmean:        38.200 cm/s LVOT VTI:          0.102 m LVOT/AV VTI ratio: 0.36  AORTA Ao Root diam: 3.30 cm Ao Asc diam:  3.20 cm MITRAL VALVE MV Area (PHT): 3.35 cm    SHUNTS MV  Decel Time: 227 msec    Systemic VTI:  0.10 m MR Peak grad: 75.0 mmHg    Systemic Diam: 2.20 cm MR Vmax:      433.00 cm/s MV E velocity: 96.55 cm/s MV A velocity: 96.40 cm/s MV E/A ratio:  1.00 Buford Dresser MD Electronically signed by Buford Dresser MD Signature Date/Time: 08/23/2022/4:55:05 PM    Final    DG Chest 2 View  Result Date: 08/20/2022 CLINICAL DATA:  Patient notes upper chest tightness that has been radiating to her left arm. She notes this started a few days ago, but has happened before due to her Hx CHF. She also notes SOB, dry cough, DM, asthma. EXAM: CHEST - 2 VIEW COMPARISON:  08/19/2022 FINDINGS: Lungs are clear. Mild cardiomegaly. No effusion. Visualized bones unremarkable. IMPRESSION: Mild cardiomegaly.  No acute findings. Electronically Signed   By: Lucrezia Europe M.D.   On: 08/20/2022 14:35   DG Chest Port 1 View  Result Date: 08/19/2022 CLINICAL DATA:  Shortness of breath. EXAM: PORTABLE CHEST 1 VIEW COMPARISON:  PA Lat 08/14/2022 FINDINGS: The heart is moderately enlarged. The mediastinum is normally outlined. There is mild central vascular prominence without findings of acute edema. The lungs  are clear of infiltrates. There is no substantial pleural effusion. Mild chronic elevation right hemidiaphragm. Thoracic spondylosis. IMPRESSION: Moderate cardiomegaly with mild central vascular prominence. No findings of acute edema or airspace consolidation. Electronically Signed   By: Telford Nab M.D.   On: 08/19/2022 05:31   DG Knee Complete 4 Views Left  Result Date: 08/14/2022 CLINICAL DATA:  Recent fall with left knee pain, initial encounter EXAM: LEFT KNEE - COMPLETE 4+ VIEW COMPARISON:  None Available. FINDINGS: Tricompartmental degenerative changes are noted. No joint effusion is seen. No acute fracture or dislocation is seen. IMPRESSION: Degenerative change without acute abnormality. Electronically Signed   By: Inez Catalina M.D.   On: 08/14/2022 21:27   DG Chest 2  View  Result Date: 08/14/2022 CLINICAL DATA:  Shortness of breath, cough EXAM: CHEST - 2 VIEW COMPARISON:  08/03/2022 FINDINGS: The heart size and mediastinal contours are within normal limits. Both lungs are clear. The visualized skeletal structures are unremarkable. IMPRESSION: No active cardiopulmonary disease. Electronically Signed   By: Rolm Baptise M.D.   On: 08/14/2022 03:05   DG Chest Port 1 View  Result Date: 08/03/2022 CLINICAL DATA:  Cough and shortness of breath EXAM: PORTABLE CHEST 1 VIEW COMPARISON:  07/07/2022 and prior studies FINDINGS: This is a low volume study.  Telemetry leads overlie the chest. Cardiomegaly again noted. There is no evidence of focal airspace disease, pulmonary edema, suspicious pulmonary nodule/mass, pleural effusion, or pneumothorax. No acute bony abnormalities are identified. IMPRESSION: Cardiomegaly without evidence of acute cardiopulmonary disease. Electronically Signed   By: Margarette Canada M.D.   On: 08/03/2022 13:29    Microbiology: Results for orders placed or performed during the hospital encounter of 08/26/22  Resp panel by RT-PCR (RSV, Flu A&B, Covid) Anterior Nasal Swab     Status: None   Collection Time: 08/26/22 10:22 PM   Specimen: Anterior Nasal Swab  Result Value Ref Range Status   SARS Coronavirus 2 by RT PCR NEGATIVE NEGATIVE Final   Influenza A by PCR NEGATIVE NEGATIVE Final   Influenza B by PCR NEGATIVE NEGATIVE Final    Comment: (NOTE) The Xpert Xpress SARS-CoV-2/FLU/RSV plus assay is intended as an aid in the diagnosis of influenza from Nasopharyngeal swab specimens and should not be used as a sole basis for treatment. Nasal washings and aspirates are unacceptable for Xpert Xpress SARS-CoV-2/FLU/RSV testing.  Fact Sheet for Patients: EntrepreneurPulse.com.au  Fact Sheet for Healthcare Providers: IncredibleEmployment.be  This test is not yet approved or cleared by the [place] FDA and has  been authorized for detection and/or diagnosis of SARS-CoV-2 by FDA under an Emergency Use Authorization (EUA). This EUA will remain in effect (meaning this test can be used) for the duration of the COVID-19 declaration under Section 564(b)(1) of the Act, 21 U.S.C. section 360bbb-3(b)(1), unless the authorization is terminated or revoked.     Resp Syncytial Virus by PCR NEGATIVE NEGATIVE Final    Comment: (NOTE) Fact Sheet for Patients: EntrepreneurPulse.com.au  Fact Sheet for Healthcare Providers: IncredibleEmployment.be  This test is not yet approved or cleared by the [place] FDA and has been authorized for detection and/or diagnosis of SARS-CoV-2 by FDA under an Emergency Use Authorization (EUA). This EUA will remain in effect (meaning this test can be used) for the duration of the COVID-19 declaration under Section 564(b)(1) of the Act, 21 U.S.C. section 360bbb-3(b)(1), unless the authorization is terminated or revoked.  Performed at [facility], [place] [address], [place], [place] [address]  Labs: CBC: Recent Labs  Lab 08/24/22 0359 08/26/22 2225 08/26/22 2257 08/27/22 0813 08/28/22 0645 08/29/22 0029  WBC 7.1 7.0  --   --  6.6 6.7  NEUTROABS  --  5.8  --   --  4.2 4.1  HGB 11.1* 11.3* 11.6* 12.6 11.1* 10.4*  HCT 34.3* 34.1* 34.0* 37.0 35.7* 32.5*  MCV 88.6 87.2  --   --  90.6 89.3  PLT 165 190  --   --  164 [id number]   Basic Metabolic Panel: Recent Labs  Lab 08/23/22 1003 08/24/22 0359 08/26/22 2225 08/26/22 2257 08/27/22 0754 08/27/22 0813 08/28/22 0645 08/29/22 0029  NA 136 137 132* 134*  --  131* 137 134*  K 4.3 3.8 4.3 4.3  --  4.7 4.1 4.4  CL 104 99 91*  --   --   --  100 101  CO2 24 27 28   --   --   --  28 28  GLUCOSE 129* 130* 523*  --   --   --  227* 263*  BUN 17 23* 35*  --   --   --  31* 27*  CREATININE 0.88 1.03* 1.34*  --   --   --  1.21* 1.33*  CALCIUM 8.4* 8.5* 8.7*  --   --   --  8.4* 8.2*   MG  --  2.0  --   --  2.4  --  2.0 2.2   Liver Function Tests: Recent Labs  Lab 08/24/22 0359 08/26/22 2225 08/28/22 0645 08/29/22 0029  AST 8* 26 14* 12*  ALT 18 27 20 21   ALKPHOS 73 84 71 78  BILITOT 0.6 1.1 0.5 0.5  PROT 5.5* 6.2* 5.5* 5.3*  ALBUMIN 3.0* 3.8 3.2* 2.9*   CBG: Recent Labs  Lab 08/28/22 1630 08/28/22 2123 08/29/22 0011 08/29/22 0558 08/29/22 1115  GLUCAP 297* 83 230* 178* 346*    Discharge time spent: greater than 30 minutes.  Signed: Tawni Millers, MD [facility] 08/29/2022

## 2022-08-29 NOTE — Assessment & Plan Note (Signed)
TSH is 0,259 Free T 4 is 1,41   Will add low dose methimazole.  Will need outpatient follow up thyroid function testing in 3 weeks.

## 2022-08-30 ENCOUNTER — Encounter: Payer: Self-pay | Admitting: *Deleted

## 2022-08-30 ENCOUNTER — Other Ambulatory Visit: Payer: Self-pay

## 2022-08-30 ENCOUNTER — Emergency Department (HOSPITAL_COMMUNITY): Payer: Medicaid Other

## 2022-08-30 ENCOUNTER — Encounter (HOSPITAL_COMMUNITY): Payer: Self-pay

## 2022-08-30 ENCOUNTER — Inpatient Hospital Stay (HOSPITAL_COMMUNITY)
Admission: EM | Admit: 2022-08-30 | Discharge: 2022-09-02 | DRG: 638 | Disposition: A | Discharge status: Home / Self Care | Payer: Medicaid Other | Attending: Obstetrics and Gynecology | Admitting: Obstetrics and Gynecology

## 2022-08-30 DIAGNOSIS — J4489 Other specified chronic obstructive pulmonary disease: Secondary | ICD-10-CM | POA: Diagnosis present

## 2022-08-30 DIAGNOSIS — F419 Anxiety disorder, unspecified: Secondary | ICD-10-CM | POA: Diagnosis present

## 2022-08-30 DIAGNOSIS — Z8249 Family history of ischemic heart disease and other diseases of the circulatory system: Secondary | ICD-10-CM

## 2022-08-30 DIAGNOSIS — N1832 Chronic kidney disease, stage 3b: Secondary | ICD-10-CM | POA: Diagnosis present

## 2022-08-30 DIAGNOSIS — E1165 Type 2 diabetes mellitus with hyperglycemia: Secondary | ICD-10-CM | POA: Diagnosis not present

## 2022-08-30 DIAGNOSIS — I509 Heart failure, unspecified: Secondary | ICD-10-CM

## 2022-08-30 DIAGNOSIS — Z5901 Sheltered homelessness: Secondary | ICD-10-CM

## 2022-08-30 DIAGNOSIS — E1122 Type 2 diabetes mellitus with diabetic chronic kidney disease: Secondary | ICD-10-CM | POA: Diagnosis present

## 2022-08-30 DIAGNOSIS — Z7951 Long term (current) use of inhaled steroids: Secondary | ICD-10-CM

## 2022-08-30 DIAGNOSIS — Z885 Allergy status to narcotic agent status: Secondary | ICD-10-CM

## 2022-08-30 DIAGNOSIS — R739 Hyperglycemia, unspecified: Principal | ICD-10-CM

## 2022-08-30 DIAGNOSIS — F319 Bipolar disorder, unspecified: Secondary | ICD-10-CM | POA: Diagnosis present

## 2022-08-30 DIAGNOSIS — I10 Essential (primary) hypertension: Secondary | ICD-10-CM | POA: Diagnosis present

## 2022-08-30 DIAGNOSIS — I5022 Chronic systolic (congestive) heart failure: Secondary | ICD-10-CM | POA: Diagnosis present

## 2022-08-30 DIAGNOSIS — Z833 Family history of diabetes mellitus: Secondary | ICD-10-CM

## 2022-08-30 DIAGNOSIS — Z91048 Other nonmedicinal substance allergy status: Secondary | ICD-10-CM

## 2022-08-30 DIAGNOSIS — I130 Hypertensive heart and chronic kidney disease with heart failure and stage 1 through stage 4 chronic kidney disease, or unspecified chronic kidney disease: Secondary | ICD-10-CM | POA: Diagnosis present

## 2022-08-30 DIAGNOSIS — J449 Chronic obstructive pulmonary disease, unspecified: Secondary | ICD-10-CM | POA: Diagnosis present

## 2022-08-30 DIAGNOSIS — Z7982 Long term (current) use of aspirin: Secondary | ICD-10-CM

## 2022-08-30 DIAGNOSIS — I214 Non-ST elevation (NSTEMI) myocardial infarction: Secondary | ICD-10-CM | POA: Diagnosis present

## 2022-08-30 DIAGNOSIS — E785 Hyperlipidemia, unspecified: Secondary | ICD-10-CM | POA: Diagnosis present

## 2022-08-30 DIAGNOSIS — Z888 Allergy status to other drugs, medicaments and biological substances status: Secondary | ICD-10-CM

## 2022-08-30 DIAGNOSIS — G8929 Other chronic pain: Secondary | ICD-10-CM | POA: Diagnosis present

## 2022-08-30 DIAGNOSIS — F17210 Nicotine dependence, cigarettes, uncomplicated: Secondary | ICD-10-CM | POA: Diagnosis present

## 2022-08-30 DIAGNOSIS — Z139 Encounter for screening, unspecified: Secondary | ICD-10-CM

## 2022-08-30 DIAGNOSIS — I252 Old myocardial infarction: Secondary | ICD-10-CM

## 2022-08-30 DIAGNOSIS — E119 Type 2 diabetes mellitus without complications: Secondary | ICD-10-CM

## 2022-08-30 DIAGNOSIS — Z794 Long term (current) use of insulin: Secondary | ICD-10-CM

## 2022-08-30 DIAGNOSIS — Z79899 Other long term (current) drug therapy: Secondary | ICD-10-CM

## 2022-08-30 DIAGNOSIS — Z5900 Homelessness unspecified: Secondary | ICD-10-CM

## 2022-08-30 DIAGNOSIS — F205 Residual schizophrenia: Secondary | ICD-10-CM | POA: Diagnosis present

## 2022-08-30 DIAGNOSIS — E0590 Thyrotoxicosis, unspecified without thyrotoxic crisis or storm: Secondary | ICD-10-CM | POA: Diagnosis present

## 2022-08-30 DIAGNOSIS — E039 Hypothyroidism, unspecified: Secondary | ICD-10-CM | POA: Diagnosis present

## 2022-08-30 LAB — I-STAT CHEM 8, ED
BUN: 26 mg/dL — ABNORMAL HIGH (ref 6–20)
Calcium, Ion: 1.13 mmol/L — ABNORMAL LOW (ref 1.15–1.40)
Chloride: 94 mmol/L — ABNORMAL LOW (ref 98–111)
Creatinine, Ser: 1.3 mg/dL — ABNORMAL HIGH (ref 0.44–1.00)
Glucose, Bld: 644 mg/dL (ref 70–99)
HCT: 39 % (ref 36.0–46.0)
Hemoglobin: 13.3 g/dL (ref 12.0–15.0)
Potassium: 4.3 mmol/L (ref 3.5–5.1)
Sodium: 132 mmol/L — ABNORMAL LOW (ref 135–145)
TCO2: 27 mmol/L (ref 22–32)

## 2022-08-30 LAB — CBC
HCT: 40.2 % (ref 36.0–46.0)
HCT: 36.7 % (ref 36.0–46.0)
Hemoglobin: 12.9 g/dL (ref 12.0–15.0)
Hemoglobin: 12.1 g/dL (ref 12.0–15.0)
MCH: 28.5 pg (ref 26.0–34.0)
MCH: 28.5 pg (ref 26.0–34.0)
MCHC: 32.1 g/dL (ref 30.0–36.0)
MCHC: 33 g/dL (ref 30.0–36.0)
MCV: 88.9 fL (ref 80.0–100.0)
MCV: 86.6 fL (ref 80.0–100.0)
Platelets: 196 10*3/uL (ref 150–400)
Platelets: 190 10*3/uL (ref 150–400)
RBC: 4.52 MIL/uL (ref 3.87–5.11)
RBC: 4.24 MIL/uL (ref 3.87–5.11)
RDW: 15.7 % — ABNORMAL HIGH (ref 11.5–15.5)
RDW: 15.6 % — ABNORMAL HIGH (ref 11.5–15.5)
WBC: 7.6 10*3/uL (ref 4.0–10.5)
WBC: 7.8 10*3/uL (ref 4.0–10.5)
nRBC: 0 % (ref 0.0–0.2)
nRBC: 0 % (ref 0.0–0.2)

## 2022-08-30 LAB — I-STAT VENOUS BLOOD GAS, ED
Acid-Base Excess: 3 mmol/L — ABNORMAL HIGH (ref 0.0–2.0)
Bicarbonate: 26.3 mmol/L (ref 20.0–28.0)
Calcium, Ion: 1.11 mmol/L — ABNORMAL LOW (ref 1.15–1.40)
HCT: 39 % (ref 36.0–46.0)
Hemoglobin: 13.3 g/dL (ref 12.0–15.0)
O2 Saturation: 96 %
Potassium: 4.3 mmol/L (ref 3.5–5.1)
Sodium: 131 mmol/L — ABNORMAL LOW (ref 135–145)
TCO2: 27 mmol/L (ref 22–32)
pCO2, Ven: 34.9 mmHg — ABNORMAL LOW (ref 44–60)
pH, Ven: 7.485 — ABNORMAL HIGH (ref 7.25–7.43)
pO2, Ven: 76 mmHg — ABNORMAL HIGH (ref 32–45)

## 2022-08-30 LAB — TROPONIN I (HIGH SENSITIVITY)
Troponin I (High Sensitivity): 201 ng/L (ref <18)
Troponin I (High Sensitivity): 141 ng/L (ref <18)

## 2022-08-30 LAB — COMPREHENSIVE METABOLIC PANEL
ALT: 34 U/L (ref 0–44)
AST: 25 U/L (ref 15–41)
Albumin: 4.1 g/dL (ref 3.5–5.0)
Alkaline Phosphatase: 123 U/L (ref 38–126)
Anion gap: 13 (ref 5–15)
BUN: 24 mg/dL — ABNORMAL HIGH (ref 6–20)
CO2: 23 mmol/L (ref 22–32)
Calcium: 9 mg/dL (ref 8.9–10.3)
Chloride: 94 mmol/L — ABNORMAL LOW (ref 98–111)
Creatinine, Ser: 1.46 mg/dL — ABNORMAL HIGH (ref 0.44–1.00)
GFR, Estimated: 43 mL/min — ABNORMAL LOW (ref >60)
Glucose, Bld: 605 mg/dL (ref 70–99)
Potassium: 4.4 mmol/L (ref 3.5–5.1)
Sodium: 130 mmol/L — ABNORMAL LOW (ref 135–145)
Total Bilirubin: 0.8 mg/dL (ref 0.3–1.2)
Total Protein: 7.1 g/dL (ref 6.5–8.1)

## 2022-08-30 LAB — I-STAT BETA HCG BLOOD, ED (MC, WL, AP ONLY): I-stat hCG, quantitative: 6.1 m[IU]/mL — ABNORMAL HIGH (ref <5)

## 2022-08-30 LAB — CBG MONITORING, ED
Glucose-Capillary: >600 mg/dL (ref 70–99)
Glucose-Capillary: 406 mg/dL — ABNORMAL HIGH (ref 70–99)

## 2022-08-30 LAB — GLUCOSE, CAPILLARY: Glucose-Capillary: 375 mg/dL — ABNORMAL HIGH (ref 70–99)

## 2022-08-30 LAB — GLUCOSE, POCT (MANUAL RESULT ENTRY): POC Glucose: 563 mg/dl — AB (ref 70–99)

## 2022-08-30 LAB — CREATININE, SERUM
Creatinine, Ser: 1.32 mg/dL — ABNORMAL HIGH (ref 0.44–1.00)
GFR, Estimated: 48 mL/min — ABNORMAL LOW (ref >60)

## 2022-08-30 MED ORDER — INSULIN ASPART 100 UNIT/ML IJ SOLN
8.0000 [IU] | Freq: Once | INTRAMUSCULAR | Status: AC
  Administered 2022-08-30: 8 [IU] via SUBCUTANEOUS

## 2022-08-30 MED ORDER — ONDANSETRON 4 MG PO TBDP
8.0000 mg | ORAL_TABLET | Freq: Three times a day (TID) | ORAL | Status: DC | PRN
  Administered 2022-08-30 – 2022-08-31 (×2): 8 mg via ORAL
  Filled 2022-08-30 (×2): qty 2

## 2022-08-30 MED ORDER — POTASSIUM CHLORIDE CRYS ER 20 MEQ PO TBCR
20.0000 meq | EXTENDED_RELEASE_TABLET | Freq: Every day | ORAL | Status: DC
  Administered 2022-08-31 – 2022-09-02 (×3): 20 meq via ORAL
  Filled 2022-08-30 (×3): qty 1

## 2022-08-30 MED ORDER — ACETAMINOPHEN 500 MG PO TABS
500.0000 mg | ORAL_TABLET | Freq: Four times a day (QID) | ORAL | Status: DC | PRN
  Administered 2022-08-30: 500 mg via ORAL
  Filled 2022-08-30: qty 1

## 2022-08-30 MED ORDER — NICOTINE POLACRILEX 2 MG MT GUM
2.0000 mg | CHEWING_GUM | OROMUCOSAL | Status: DC | PRN
  Filled 2022-08-30 (×2): qty 1

## 2022-08-30 MED ORDER — MELATONIN 5 MG PO TABS
5.0000 mg | ORAL_TABLET | Freq: Every evening | ORAL | Status: DC | PRN
  Administered 2022-08-30 – 2022-08-31 (×2): 5 mg via ORAL
  Filled 2022-08-30 (×2): qty 1

## 2022-08-30 MED ORDER — IPRATROPIUM-ALBUTEROL 0.5-2.5 (3) MG/3ML IN SOLN
3.0000 mL | Freq: Once | RESPIRATORY_TRACT | Status: AC
  Administered 2022-08-30: 3 mL via RESPIRATORY_TRACT
  Filled 2022-08-30: qty 3

## 2022-08-30 MED ORDER — ONDANSETRON HCL 4 MG/2ML IJ SOLN
4.0000 mg | Freq: Once | INTRAMUSCULAR | Status: DC
  Filled 2022-08-30: qty 2

## 2022-08-30 MED ORDER — INSULIN GLARGINE-YFGN 100 UNIT/ML ~~LOC~~ SOLN
10.0000 [IU] | Freq: Two times a day (BID) | SUBCUTANEOUS | Status: DC
  Administered 2022-08-30 – 2022-09-02 (×6): 10 [IU] via SUBCUTANEOUS
  Filled 2022-08-30 (×7): qty 0.1

## 2022-08-30 MED ORDER — ENOXAPARIN SODIUM 40 MG/0.4ML IJ SOSY
40.0000 mg | PREFILLED_SYRINGE | INTRAMUSCULAR | Status: DC
  Administered 2022-08-30 – 2022-09-01 (×3): 40 mg via SUBCUTANEOUS
  Filled 2022-08-30 (×3): qty 0.4

## 2022-08-30 MED ORDER — ROSUVASTATIN CALCIUM 20 MG PO TABS
20.0000 mg | ORAL_TABLET | Freq: Every day | ORAL | Status: DC
  Administered 2022-08-31 – 2022-09-02 (×3): 20 mg via ORAL
  Filled 2022-08-30 (×3): qty 1

## 2022-08-30 MED ORDER — POLYETHYLENE GLYCOL 3350 17 G PO PACK: 17.0000 g | PACK | Freq: Every day | ORAL | Status: DC | PRN

## 2022-08-30 MED ORDER — OXYCODONE-ACETAMINOPHEN 5-325 MG PO TABS
1.0000 | ORAL_TABLET | Freq: Four times a day (QID) | ORAL | Status: DC | PRN
  Administered 2022-08-31 (×2): 1 via ORAL
  Filled 2022-08-30 (×2): qty 1

## 2022-08-30 MED ORDER — METOPROLOL SUCCINATE ER 25 MG PO TB24: 25.0000 mg | ORAL_TABLET | Freq: Every day | ORAL | Status: DC

## 2022-08-30 MED ORDER — ONDANSETRON 4 MG PO TBDP
4.0000 mg | ORAL_TABLET | Freq: Once | ORAL | Status: AC
  Administered 2022-08-30: 4 mg via ORAL
  Filled 2022-08-30: qty 1

## 2022-08-30 MED ORDER — INSULIN ASPART 100 UNIT/ML IJ SOLN
0.0000 [IU] | Freq: Every day | INTRAMUSCULAR | Status: DC
  Administered 2022-08-30: 5 [IU] via SUBCUTANEOUS
  Administered 2022-08-31 – 2022-09-01 (×2): 2 [IU] via SUBCUTANEOUS

## 2022-08-30 MED ORDER — ASPIRIN 81 MG PO TBEC
81.0000 mg | DELAYED_RELEASE_TABLET | Freq: Every day | ORAL | Status: DC
  Administered 2022-08-30 – 2022-09-02 (×4): 81 mg via ORAL
  Filled 2022-08-30 (×4): qty 1

## 2022-08-30 MED ORDER — ACETAMINOPHEN 325 MG PO TABS
650.0000 mg | ORAL_TABLET | Freq: Four times a day (QID) | ORAL | Status: DC | PRN
  Administered 2022-09-02: 650 mg via ORAL
  Filled 2022-08-30: qty 2

## 2022-08-30 MED ORDER — OXYCODONE HCL 5 MG PO TABS
5.0000 mg | ORAL_TABLET | Freq: Four times a day (QID) | ORAL | Status: DC | PRN
  Administered 2022-08-30 – 2022-09-02 (×9): 5 mg via ORAL
  Filled 2022-08-30 (×9): qty 1

## 2022-08-30 MED ORDER — BUMETANIDE 2 MG PO TABS
2.0000 mg | ORAL_TABLET | Freq: Every day | ORAL | Status: DC
  Administered 2022-08-31 – 2022-09-02 (×2): 2 mg via ORAL
  Filled 2022-08-30 (×3): qty 1

## 2022-08-30 MED ORDER — ISOSORBIDE MONONITRATE ER 30 MG PO TB24: 60.0000 mg | ORAL_TABLET | Freq: Every day | ORAL | Status: DC

## 2022-08-30 MED ORDER — INSULIN ASPART 100 UNIT/ML IJ SOLN
0.0000 [IU] | Freq: Three times a day (TID) | INTRAMUSCULAR | Status: DC
  Administered 2022-08-31: 15 [IU] via SUBCUTANEOUS
  Administered 2022-08-31: 8 [IU] via SUBCUTANEOUS
  Administered 2022-08-31 – 2022-09-01 (×2): 5 [IU] via SUBCUTANEOUS
  Administered 2022-09-01: 8 [IU] via SUBCUTANEOUS
  Administered 2022-09-01: 3 [IU] via SUBCUTANEOUS
  Administered 2022-09-02: 8 [IU] via SUBCUTANEOUS
  Administered 2022-09-02: 3 [IU] via SUBCUTANEOUS

## 2022-08-30 MED ORDER — HYDROCODONE-ACETAMINOPHEN 5-325 MG PO TABS
1.0000 | ORAL_TABLET | Freq: Once | ORAL | Status: AC
  Administered 2022-08-30: 1 via ORAL
  Filled 2022-08-30: qty 1

## 2022-08-30 MED ORDER — RISPERIDONE 1 MG PO TABS: 1.0000 mg | ORAL_TABLET | ORAL | Status: DC

## 2022-08-30 MED ORDER — METHIMAZOLE 5 MG PO TABS
5.0000 mg | ORAL_TABLET | Freq: Every day | ORAL | Status: DC
  Administered 2022-08-31 – 2022-09-02 (×3): 5 mg via ORAL
  Filled 2022-08-30 (×3): qty 1

## 2022-08-30 MED ORDER — METOPROLOL SUCCINATE ER 25 MG PO TB24
25.0000 mg | ORAL_TABLET | Freq: Every day | ORAL | Status: DC
  Administered 2022-08-31 – 2022-09-02 (×2): 25 mg via ORAL
  Filled 2022-08-30 (×3): qty 1

## 2022-08-30 MED ORDER — SODIUM CHLORIDE 0.9 % IV BOLUS: 1000.0000 mL | Freq: Once | INTRAVENOUS | Status: DC

## 2022-08-30 MED ORDER — RISPERIDONE 1 MG PO TABS
1.0000 mg | ORAL_TABLET | Freq: Every day | ORAL | Status: DC
  Administered 2022-08-30 – 2022-09-02 (×4): 1 mg via ORAL
  Filled 2022-08-30 (×4): qty 1

## 2022-08-30 MED ORDER — PROCHLORPERAZINE EDISYLATE 10 MG/2ML IJ SOLN: 5.0000 mg | Freq: Four times a day (QID) | INTRAMUSCULAR | Status: DC | PRN

## 2022-08-30 MED ORDER — RISPERIDONE 2 MG PO TABS
2.0000 mg | ORAL_TABLET | Freq: Every day | ORAL | Status: DC
  Administered 2022-08-30 – 2022-09-01 (×3): 2 mg via ORAL
  Filled 2022-08-30 (×5): qty 1

## 2022-08-30 MED ORDER — ISOSORBIDE MONONITRATE ER 60 MG PO TB24
60.0000 mg | ORAL_TABLET | Freq: Every day | ORAL | Status: DC
  Administered 2022-08-31 – 2022-09-02 (×2): 60 mg via ORAL
  Filled 2022-08-30 (×3): qty 1

## 2022-08-30 MED ORDER — OXYCODONE-ACETAMINOPHEN 10-325 MG PO TABS: 1.0000 | ORAL_TABLET | Freq: Four times a day (QID) | ORAL | Status: DC | PRN

## 2022-08-30 NOTE — ED Notes (Addendum)
ED TO INPATIENT HANDOFF REPORT  ED Nurse Name and Phone #: Jamelia Varano/ [phone]  S Name/Age/Gender [name] [name] 52 y.o. female Room/Bed: 002C/002C  Code Status   Code Status: Full Code  Home/SNF/Other Home Patient oriented to: self, place, time, and situation Is this baseline? Yes   Triage Complete: Triage complete  Chief Complaint Hyperglycemia due to diabetes mellitus ([place]) [E11.65]  Triage Note Pt c/o hyperglycemia, states CBG 580; pt not on meds; seen yesterday for same, states she was supposed to receive meds but was not given any; c/o upper back pain, chest tightness, and L arm pain; took nitro 20 minutes ago w/o relief; denies sob   Allergies Allergies  Allergen Reactions   Tramadol Nausea Only and Rash   Wellbutrin [Bupropion] Other (See Comments)    rage   Nicotine Rash    Patient states she is allergic to the Nicotine patch and they cause her to break out in a rash.  She states she can smoke cigarettes.    Level of Care/Admitting Diagnosis ED Disposition     ED Disposition  Admit   Condition  --   [facility]: [place] [[id number]]  Level of Care: Telemetry Cardiac [[id number]]  May place patient in observation at [facility] or [place] if equivalent level of care is available:: No  Covid Evaluation: Asymptomatic - no recent exposure (last 10 days) testing not required  Diagnosis: Hyperglycemia due to diabetes mellitus [facility]) [id number]  Admitting Physician: Kayleen Memos [id number]  Attending Physician: Kayleen Memos [id number]          B Medical/Surgery History Past Medical History:  Diagnosis Date   Anxiety disorder    Asthma    Bipolar affective disorder ([place])    Blood transfusion without reported diagnosis    CHF (congestive heart failure) ([place])    Depression    Diabetes mellitus    Migraine    Schizophrenia ([place])    Past Surgical History:  Procedure Laterality Date   KNEE ARTHROSCOPY     x 2   KNEE SURGERY  Left    Incision made and knee cleaned out   TUBAL LIGATION       A IV Location/Drains/Wounds Patient Lines/Drains/Airways Status     Active Line/Drains/Airways     Name Placement date Placement time Site Days   Peripheral IV 08/30/22 20 G 1.88" Left;Anterior Forearm 08/30/22  2023  Forearm  less than 1            Intake/Output Last 24 hours No intake or output data in the 24 hours ending 08/30/22 2040  Labs/Imaging Results for orders placed or performed during the hospital encounter of 08/30/22 (from the past 48 hour(s))  POC CBG, ED     Status: Abnormal   Collection Time: 08/30/22 12:54 PM  Result Value Ref Range   Glucose-Capillary >600 (HH) 70 - 99 mg/dL    Comment: Glucose reference range applies only to samples taken after fasting for at least 8 hours.  CBC     Status: Abnormal   Collection Time: 08/30/22 12:56 PM  Result Value Ref Range   WBC 7.6 4.0 - 10.5 K/uL   RBC 4.52 3.87 - 5.11 MIL/uL   Hemoglobin 12.9 12.0 - 15.0 g/dL   HCT 40.2 36.0 - 46.0 %   MCV 88.9 80.0 - 100.0 fL   MCH 28.5 26.0 - 34.0 pg   MCHC 32.1 30.0 - 36.0 g/dL   RDW 15.7 (H) 11.5 -  15.5 %   Platelets 196 150 - 400 K/uL   nRBC 0.0 0.0 - 0.2 %    Comment: Performed at [facility], [place] [address], [place], [place] [address]  Troponin I (High Sensitivity)     Status: Abnormal   Collection Time: 08/30/22 12:56 PM  Result Value Ref Range   Troponin I (High Sensitivity) 201 (HH) <18 ng/L    Comment: CRITICAL RESULT CALLED TO, READ BACK BY AND VERIFIED WITH J,Daphine Loch RN @1530  08/30/22 E,[place] (NOTE) Elevated high sensitivity troponin I (hsTnI) values and significant  changes across serial measurements may suggest ACS but many other  chronic and acute conditions are known to elevate hsTnI results.  Refer to the "Links" section for chest pain algorithms and additional  guidance. Performed at [facility], [place] [address] [place], [place] [address]   Comprehensive metabolic panel      Status: Abnormal   Collection Time: 08/30/22 12:56 PM  Result Value Ref Range   Sodium 130 (L) 135 - 145 mmol/L   Potassium 4.4 3.5 - 5.1 mmol/L   Chloride 94 (L) 98 - 111 mmol/L   CO2 23 22 - 32 mmol/L   Glucose, Bld 605 (HH) 70 - 99 mg/dL    Comment: CRITICAL RESULT CALLED TO, READ BACK BY AND VERIFIED WITH J,Aerica Rincon RN @1530  08/30/22 E,[place] Glucose reference range applies only to samples taken after fasting for at least 8 hours.    BUN 24 (H) 6 - 20 mg/dL   Creatinine, Ser 1.46 (H) 0.44 - 1.00 mg/dL   Calcium 9.0 8.9 - 10.3 mg/dL   Total Protein 7.1 6.5 - 8.1 g/dL   Albumin 4.1 3.5 - 5.0 g/dL   AST 25 15 - 41 U/L   ALT 34 0 - 44 U/L   Alkaline Phosphatase 123 38 - 126 U/L   Total Bilirubin 0.8 0.3 - 1.2 mg/dL   GFR, Estimated 43 (L) >60 mL/min    Comment: (NOTE) Calculated using the CKD-EPI Creatinine Equation (2021)    Anion gap 13 5 - 15    Comment: Performed at [facility] [address], [place], [place] [address]  I-Stat beta hCG blood, ED     Status: Abnormal   Collection Time: 08/30/22  2:13 PM  Result Value Ref Range   I-stat hCG, quantitative 6.1 (H) <5 mIU/mL   Comment 3            Comment:   GEST. AGE      CONC.  (mIU/mL)   <=1 WEEK        5 - 50     2 WEEKS       50 - 500     3 WEEKS       100 - 10,000     4 WEEKS     1,000 - 30,000        FEMALE AND NON-PREGNANT FEMALE:     LESS THAN 5 mIU/mL   I-Stat venous blood gas, ([facility] ED, [facility], [facility])     Status: Abnormal   Collection Time: 08/30/22  2:16 PM  Result Value Ref Range   pH, Ven 7.485 (H) 7.25 - 7.43   pCO2, Ven 34.9 (L) 44 - 60 mmHg   pO2, Ven 76 (H) 32 - 45 mmHg   Bicarbonate 26.3 20.0 - 28.0 mmol/L   TCO2 27 22 - 32 mmol/L   O2 Saturation 96 %   Acid-Base Excess 3.0 (H) 0.0 - 2.0  mmol/L   Sodium 131 (L) 135 - 145 mmol/L   Potassium 4.3 3.5 - 5.1 mmol/L   Calcium, Ion 1.11 (L) 1.15 - 1.40 mmol/L   HCT 39.0 36.0 - 46.0 %   Hemoglobin 13.3 12.0 - 15.0 g/dL   Sample type VENOUS   I-stat  chem 8, ED (not at [facility], [facility] or [facility])     Status: Abnormal   Collection Time: 08/30/22  2:17 PM  Result Value Ref Range   Sodium 132 (L) 135 - 145 mmol/L   Potassium 4.3 3.5 - 5.1 mmol/L   Chloride 94 (L) 98 - 111 mmol/L   BUN 26 (H) 6 - 20 mg/dL   Creatinine, Ser 1.30 (H) 0.44 - 1.00 mg/dL   Glucose, Bld 644 (HH) 70 - 99 mg/dL    Comment: Glucose reference range applies only to samples taken after fasting for at least 8 hours.   Calcium, Ion 1.13 (L) 1.15 - 1.40 mmol/L   TCO2 27 22 - 32 mmol/L   Hemoglobin 13.3 12.0 - 15.0 g/dL   HCT 39.0 36.0 - 46.0 %   Comment NOTIFIED PHYSICIAN   Troponin I (High Sensitivity)     Status: Abnormal   Collection Time: 08/30/22  3:47 PM  Result Value Ref Range   Troponin I (High Sensitivity) 141 (HH) <18 ng/L    Comment: CRITICAL VALUE NOTED. VALUE IS CONSISTENT WITH PREVIOUSLY REPORTED/CALLED VALUE (NOTE) Elevated high sensitivity troponin I (hsTnI) values and significant  changes across serial measurements may suggest ACS but many other  chronic and acute conditions are known to elevate hsTnI results.  Refer to the "Links" section for chest pain algorithms and additional  guidance. Performed at [facility], [place] [address], [place], [place] [address]   POC CBG, ED     Status: Abnormal   Collection Time: 08/30/22  4:59 PM  Result Value Ref Range   Glucose-Capillary 406 (H) 70 - 99 mg/dL    Comment: Glucose reference range applies only to samples taken after fasting for at least 8 hours.   DG Chest 2 View  Result Date: 08/30/2022 CLINICAL DATA:  Short this of breath, pain radiating to LEFT arm, chest pressure, and mid back pain for couple days EXAM: CHEST - 2 VIEW COMPARISON:  08/26/2022 FINDINGS: Upper normal heart size. Mediastinal contours and pulmonary vascularity normal. Lungs well expanded and clear. No pulmonary infiltrate, pleural effusion, or pneumothorax. No acute osseous findings. IMPRESSION: No acute abnormalities. Electronically  Signed   By: Lavonia Dana M.D.   On: 08/30/2022 13:23    Pending Labs Unresulted Labs (From admission, onward)     Start     Ordered   09/06/22 0500  Creatinine, serum  (enoxaparin (LOVENOX)    CrCl >/= 30 ml/min)  Weekly,   R     Comments: while on enoxaparin therapy    08/30/22 1948   08/31/22 0500  CBC  Tomorrow morning,   R        08/30/22 2029   08/31/22 0500  Comprehensive metabolic panel  Tomorrow morning,   R        08/30/22 2029   08/31/22 0500  Magnesium  Tomorrow morning,   R        08/30/22 2029   08/31/22 0500  Phosphorus  Tomorrow morning,   R        08/30/22 2029   08/30/22 1949  CBC  (enoxaparin (LOVENOX)    CrCl >/= 30 ml/min)  Once,  R       Comments: Baseline for enoxaparin therapy IF NOT ALREADY DRAWN.  Notify MD if PLT < 100 K.    08/30/22 1948   08/30/22 1949  Creatinine, serum  (enoxaparin (LOVENOX)    CrCl >/= 30 ml/min)  Once,   R       Comments: Baseline for enoxaparin therapy IF NOT ALREADY DRAWN.    08/30/22 1948            Vitals/Pain Today's Vitals   08/30/22 1715 08/30/22 1730 08/30/22 1745 08/30/22 1900  BP: 102/72 115/82 98/64 96/67   Pulse: (!) 101 100 94 90  Resp: 19 16 20  (!) 24  Temp:      TempSrc:      SpO2: 100% 97% 98% 97%  PainSc:        Isolation Precautions No active isolations  Medications Medications  nicotine polacrilex (NICORETTE) gum 2 mg (has no administration in time range)  ondansetron (ZOFRAN-ODT) disintegrating tablet 8 mg (8 mg Oral Given 08/30/22 1906)  enoxaparin (LOVENOX) injection 40 mg (has no administration in time range)  insulin glargine-yfgn (SEMGLEE) injection 10 Units (has no administration in time range)  insulin aspart (novoLOG) injection 0-15 Units (has no administration in time range)  insulin aspart (novoLOG) injection 0-5 Units (has no administration in time range)  aspirin EC tablet 81 mg (has no administration in time range)  bumetanide (BUMEX) tablet 2 mg (has no administration in time  range)  methimazole (TAPAZOLE) tablet 5 mg (has no administration in time range)  potassium chloride SA (KLOR-CON M) CR tablet 20 mEq (has no administration in time range)  risperiDONE (RISPERDAL) tablet 1-2 mg (has no administration in time range)  rosuvastatin (CRESTOR) tablet 20 mg (has no administration in time range)  metoprolol succinate (TOPROL-XL) 24 hr tablet 25 mg (has no administration in time range)  isosorbide mononitrate (IMDUR) 24 hr tablet 60 mg (has no administration in time range)  acetaminophen (TYLENOL) tablet 650 mg (has no administration in time range)  prochlorperazine (COMPAZINE) injection 5 mg (has no administration in time range)  melatonin tablet 5 mg (has no administration in time range)  polyethylene glycol (MIRALAX / GLYCOLAX) packet 17 g (has no administration in time range)  ipratropium-albuterol (DUONEB) 0.5-2.5 (3) MG/3ML nebulizer solution 3 mL (3 mLs Nebulization Given 08/30/22 1446)  insulin aspart (novoLOG) injection 8 Units (8 Units Subcutaneous Given 08/30/22 1502)  HYDROcodone-acetaminophen (NORCO/VICODIN) 5-325 MG per tablet 1 tablet (1 tablet Oral Given 08/30/22 1502)  ondansetron (ZOFRAN-ODT) disintegrating tablet 4 mg (4 mg Oral Given 08/30/22 1501)    Mobility walks     Focused Assessments     R Recommendations: See Admitting Provider Note  Report given to:   Additional Notes: Pt came in for CP, back pain, and radiating to the left arm. Pt also came in for hyperglycemia. Her CBG has been in the 600's. Her last CBG was 406. Her first trop was 201 and second trop was 141. Pt is being admitted for hyperglycemia. She has a 20 G in the L FA. I'll give her insulin before she comes up, she got tylenol earlier and zofran. She's completely A&Ox4. She's ambulatory.   Marland Kitchen

## 2022-08-30 NOTE — ED Triage Notes (Signed)
Pt c/o hyperglycemia, states CBG 580; pt not on meds; seen yesterday for same, states she was supposed to receive meds but was not given any; c/o upper back pain, chest tightness, and L arm pain; took nitro 20 minutes ago w/o relief; denies sob

## 2022-08-30 NOTE — ED Provider Notes (Signed)
[place] Provider Note   CSN: [id number] Arrival date & time: 08/30/22  1245     History  Chief Complaint  Patient presents with  . Chest Pain    [name] [name] is a 52 y.o. female.   Chest Pain   52 year old female presents emergency department with multiple complaints.  Patient most recently admitted to the hospital on 3/18 and discharged yesterday for NSTEMI.  Patient states that since discharge, has been able to get medications filled at the pharmacy.  States that she began to feel nauseous, experiencing some "foggy headedness" earlier today.  States that her blood sugar was checked at the shelter and noticed greater than 600 prompting her visit to the emergency department.  Patient reports persistent chest "tightness" that is been present since before prior admission as well as a shortness of breath with ambulation.  Patient states the symptoms remain unchanged.  She also complains of thoracic back pain that has been present for the past year and remains unchanged.  Denies fever, chills, abdominal pain, vomiting, urinary symptoms, change in bowel habits.  Past medical history significant for heart failure with ejection fraction of 20 to 25%, diabetes mellitus, bipolar disorder, depression, schizophrenia, NSTEMI, COPD, CKD, hypertension, DKA  Home Medications Prior to Admission medications   Medication Sig Start Date End Date Taking? Authorizing Provider  acetaminophen (TYLENOL) 325 MG tablet Take 2 tablets (650 mg total) by mouth every 4 (four) hours as needed for headache or mild pain. 08/29/22   Arrien, Jimmy Picket, MD  ASPIRIN LOW DOSE 81 MG tablet Take 1 tablet (81 mg total) by mouth daily. 08/23/22   Caren Griffins, MD  atorvastatin (LIPITOR) 20 MG tablet Take 1 tablet (20 mg total) by mouth daily. 08/23/22   Caren Griffins, MD  blood glucose meter kit and supplies Dispense based on patient and insurance preference. Use  up to four times daily as directed. (FOR ICD-10 E10.9, E11.9). 01/06/18   Eugenie Filler, MD  bumetanide (BUMEX) 2 MG tablet Take 1 tablet (2 mg total) by mouth 2 (two) times daily. 08/29/22 09/28/22  Arrien, Jimmy Picket, MD  fluticasone-salmeterol (ADVAIR DISKUS) 250-50 MCG/ACT AEPB Inhale 1 puff into the lungs in the morning and at bedtime. Patient not taking: Reported on 08/27/2022 08/23/22   Caren Griffins, MD  insulin glargine (LANTUS SOLOSTAR) 100 UNIT/ML Solostar Pen Inject 20 Units into the skin in the morning. Do not take if blood sugar is <100. 08/29/22 09/28/22  Arrien, Jimmy Picket, MD  Insulin Pen Needle 32G X 4 MM MISC Inject 1 each into the skin in the morning. 08/29/22   Arrien, Jimmy Picket, MD  isosorbide mononitrate (IMDUR) 60 MG 24 hr tablet Take 1 tablet (60 mg total) by mouth daily. 08/23/22 09/22/22  Caren Griffins, MD  methimazole (TAPAZOLE) 5 MG tablet Take 1 tablet (5 mg total) by mouth daily for 21 days. 08/29/22 09/19/22  Arrien, Jimmy Picket, MD  metoprolol succinate (TOPROL-XL) 25 MG 24 hr tablet Take 1 tablet (25 mg total) by mouth daily. 08/23/22 09/22/22  Caren Griffins, MD  nitroGLYCERIN (NITROSTAT) 0.4 MG SL tablet Place 1 tablet (0.4 mg total) under the tongue every 5 (five) minutes as needed for chest pain. 08/23/22   Caren Griffins, MD  oxyCODONE-acetaminophen (PERCOCET) 10-325 MG tablet Take 1 tablet by mouth every 4 (four) hours as needed for pain. 08/23/22   Caren Griffins, MD  potassium chloride SA (KLOR-CON  M) 20 MEQ tablet Take 1 tablet (20 mEq total) by mouth daily. Take with bumetanide. 08/29/22   Arrien, Jimmy Picket, MD  risperiDONE (RISPERDAL) 2 MG tablet Take one-half tablet in the morning and one tablet in the evening. 08/23/22   Caren Griffins, MD  rosuvastatin (CRESTOR) 20 MG tablet Take 1 tablet (20 mg total) by mouth daily. 08/30/22 09/29/22  Arrien, Jimmy Picket, MD  benztropine (COGENTIN) 1 MG tablet Take 1 tablet (1 mg  total) by mouth 2 (two) times daily. 12/23/18 06/04/20  Johnn Hai, MD  insulin aspart (NOVOLOG) 100 UNIT/ML injection Inject 0-9 Units into the skin 3 (three) times daily with meals. Patient not taking: Reported on 06/03/2020 12/16/18 06/04/20  Antonieta Pert, MD      Allergies    Tramadol, Wellbutrin [bupropion], and Nicotine    Review of Systems   Review of Systems  Cardiovascular:  Positive for chest pain.    Physical Exam Updated Vital Signs BP 96/67   Pulse 90   Temp 98.1 F (36.7 C) (Oral)   Resp (!) 24   SpO2 97%  Physical Exam Vitals and nursing note reviewed.  Constitutional:      General: She is not in acute distress.    Appearance: She is well-developed.  HENT:     Head: Normocephalic and atraumatic.  Eyes:     Conjunctiva/sclera: Conjunctivae normal.  Cardiovascular:     Rate and Rhythm: Normal rate and regular rhythm.     Pulses: Normal pulses.  Pulmonary:     Effort: Pulmonary effort is normal. No respiratory distress.     Comments: Mild diffuse wheeze auscultated bilateral lung fields. Abdominal:     Palpations: Abdomen is soft.     Tenderness: There is no abdominal tenderness. There is no right CVA tenderness, left CVA tenderness or guarding.  Musculoskeletal:        General: No swelling.     Cervical back: Neck supple.     Comments: 1+ bilateral lower extremity edema appreciated.  Skin:    General: Skin is warm and dry.     Capillary Refill: Capillary refill takes less than 2 seconds.  Neurological:     Mental Status: She is alert.  Psychiatric:        Mood and Affect: Mood normal.     ED Results / Procedures / Treatments   Labs (all labs ordered are listed, but only abnormal results are displayed) Labs Reviewed  CBC - Abnormal; Notable for the following components:      Result Value   RDW 15.7 (*)    All other components within normal limits  COMPREHENSIVE METABOLIC PANEL - Abnormal; Notable for the following components:   Sodium 130 (*)     Chloride 94 (*)    Glucose, Bld 605 (*)    BUN 24 (*)    Creatinine, Ser 1.46 (*)    GFR, Estimated 43 (*)    All other components within normal limits  I-STAT BETA HCG BLOOD, ED (MC, [facility], AP ONLY) - Abnormal; Notable for the following components:   I-stat hCG, quantitative 6.1 (*)    All other components within normal limits  CBG MONITORING, ED - Abnormal; Notable for the following components:   Glucose-Capillary >600 (*)    All other components within normal limits  I-STAT VENOUS BLOOD GAS, ED - Abnormal; Notable for the following components:   pH, Ven 7.485 (*)    pCO2, Ven 34.9 (*)    pO2, Ven 76 (*)  Acid-Base Excess 3.0 (*)    Sodium 131 (*)    Calcium, Ion 1.11 (*)    All other components within normal limits  I-STAT CHEM 8, ED - Abnormal; Notable for the following components:   Sodium 132 (*)    Chloride 94 (*)    BUN 26 (*)    Creatinine, Ser 1.30 (*)    Glucose, Bld 644 (*)    Calcium, Ion 1.13 (*)    All other components within normal limits  CBG MONITORING, ED - Abnormal; Notable for the following components:   Glucose-Capillary 406 (*)    All other components within normal limits  TROPONIN I (HIGH SENSITIVITY) - Abnormal; Notable for the following components:   Troponin I (High Sensitivity) 201 (*)    All other components within normal limits  TROPONIN I (HIGH SENSITIVITY) - Abnormal; Notable for the following components:   Troponin I (High Sensitivity) 141 (*)    All other components within normal limits    EKG EKG Interpretation  Date/Time:  Friday August 30 2022 12:57:42 EDT Ventricular Rate:  107 PR Interval:  156 QRS Duration: 100 QT Interval:  366 QTC Calculation: 488 R Axis:   15 Text Interpretation: Sinus tachycardia Biatrial enlargement Minimal voltage criteria for LVH, may be normal variant ( Cornell product ) Nonspecific T wave abnormality  similar to 2  days ago Confirmed by Sherwood Gambler [id number]) on 08/30/2022 2:42:37 PM  Radiology DG  Chest 2 View  Result Date: 08/30/2022 CLINICAL DATA:  Short this of breath, pain radiating to LEFT arm, chest pressure, and mid back pain for couple days EXAM: CHEST - 2 VIEW COMPARISON:  08/26/2022 FINDINGS: Upper normal heart size. Mediastinal contours and pulmonary vascularity normal. Lungs well expanded and clear. No pulmonary infiltrate, pleural effusion, or pneumothorax. No acute osseous findings. IMPRESSION: No acute abnormalities. Electronically Signed   By: Lavonia Dana M.D.   On: 08/30/2022 13:23    Procedures Procedures    Medications Ordered in ED Medications  nicotine polacrilex (NICORETTE) gum 2 mg (has no administration in time range)  ondansetron (ZOFRAN-ODT) disintegrating tablet 8 mg (8 mg Oral Given 08/30/22 1906)  acetaminophen (TYLENOL) tablet 500 mg (500 mg Oral Given 08/30/22 1906)  ipratropium-albuterol (DUONEB) 0.5-2.5 (3) MG/3ML nebulizer solution 3 mL (3 mLs Nebulization Given 08/30/22 1446)  insulin aspart (novoLOG) injection 8 Units (8 Units Subcutaneous Given 08/30/22 1502)  HYDROcodone-acetaminophen (NORCO/VICODIN) 5-325 MG per tablet 1 tablet (1 tablet Oral Given 08/30/22 1502)  ondansetron (ZOFRAN-ODT) disintegrating tablet 4 mg (4 mg Oral Given 08/30/22 1501)    ED Course/ Medical Decision Making/ A&P Clinical Course as of 08/30/22 1946  Fri Aug 30, 2022  1649 Consulted [facility] hospitalist Dr. Nevada Crane regarding the patient who agreed with admission the patient and assumed further treatment/care. [CR]    Clinical Course User Index [CR] Wilnette Kales, PA                             Medical Decision Making Risk OTC drugs. Prescription drug management. Decision regarding hospitalization.   This patient presents to the ED for concern of nausea/lightheadedness, this involves an extensive number of treatment options, and is a complaint that carries with it a high risk of complications and morbidity.  The differential diagnosis includes hyperglycemia, DKA, HHS,  CVA   Co morbidities that complicate the patient evaluation  See HPI   Additional history obtained:  Additional history obtained from EMR External  records from outside source obtained and reviewed including hospital records   Lab Tests:  I Ordered, and personally interpreted labs.  The pertinent results include: No leukocytosis noted.  No evidence of anemia.  Platelets within normal range.  Multiple electrolyte abnormalities including hyponatremia of 130 of which is corrected with glucose is 142.  Patient with evidence of slightly worsening renal function with creatinine 1.46 from prior laboratory studies performed a few days ago.  Patient mildly alkalotic with pH of 7.45 venous.  Initial troponin of 201 with repeat 141; no new EKG findings indicative of ischemia so low suspicion for ACS.   Imaging Studies ordered:  I ordered imaging studies including chest x-ray I independently visualized and interpreted imaging which showed no acute cardiopulmonary abnormalities I agree with the radiologist interpretation   Cardiac Monitoring: / EKG:  The patient was maintained on a cardiac monitor.  I personally viewed and interpreted the cardiac monitored which showed an underlying rhythm of: Sinus tachycardia with nonspecific T wave changes; similar in appearance to EKG performed a few days ago.  Consultations Obtained:  See ED course  Problem List / ED Course / Critical interventions / Medication management  Hyperglycemia I ordered medication including Norco, and 8 units of NovoLog, Zofran, DuoNeb   Reevaluation of the patient after these medicines showed that the patient improved I have reviewed the patients home medicines and have made adjustments as needed   Social Determinants of Health:  Homelessness, unable to get medication   Test / Admission - Considered:  Hyperglycemia Vitals signs significant for initial tachycardia with a heart rate of 111 oh which decreased to  within normal range with time elapsed and medicines administered while in the emergency department. Otherwise within normal range and stable throughout visit. Laboratory/imaging studies significant for: See above 52 year old female presents emergency department with complaints of vague complaints of lightheadedness/nausea.  Patient was found to have elevated blood glucose without evidence of DKA/HHS.  Patient treated with 8 units of subcu NovoLog.  Was careful of fluid supplementation given history of heart failure with EF of 20 to 25%.  Given patient's life circumstances of being homeless unable to pick up medications, admission to the hospital edema necessary for medical optimization and correction of patient's hyperglycemia prior to discharge in a more safe condition with more appropriate resources Treatment plan were discussed at length with patient and they knowledge understanding was agreeable to said plan.  Appropriate consultations were made as described in the ED course.  Patient was stable upon admission to the hospital.         Final Clinical Impression(s) / ED Diagnoses Final diagnoses:  Hyperglycemia    Rx / DC Orders ED Discharge Orders     None         Wilnette Kales, [place] 08/30/22 Linus Orn, MD 09/02/22 1754

## 2022-08-30 NOTE — H&P (Addendum)
History and Physical  [name] [name] [id number] DOB: 1970/06/16 DOA: 08/30/2022  Referring physician: Cyndie Chime  PCP: Patient, No Pcp Per  Outpatient Specialists: Cardiology Patient coming from: Shelter  Chief Complaint: Hyperglycemia  HPI: [name] [name] is a 52 y.o. female with medical history significant for uncontrolled type 2 diabetes, hypertension, CKD 3B, asthma, schizophrenia, HFrEF 2025%, recently admitted for NSTEMI discharged yesterday, returns today with severe hyperglycemia from her shelter.  Associated with nausea.  Also endorses chest tightness that has been present since prior to her admission for NSTEMI.  States she did not pick up her insulin from the pharmacy.  In the ED, lab studies notable for blood sugar in the 600s.  Troponin is elevated but downtrending.  Chest tightness is improved.  The patient is asking for soda drinks.  Restarted on subcu insulin.  The patient was admitted by [facility], hospitalist service.  ED Course: Tmax 98.9.  BP 101/87, pulse 97, respiration rate 18, saturation 97% on room air.  Lab studies notable for serum sodium 132, glucose 644, BUN 26, creatinine 1.30, troponin 201, repeat 141.  CBC essentially unremarkable.  Review of Systems: Review of systems as noted in the HPI. All other systems reviewed and are negative.   Past Medical History:  Diagnosis Date   Anxiety disorder    Asthma    Bipolar affective disorder ([place])    Blood transfusion without reported diagnosis    CHF (congestive heart failure) ([place])    Depression    Diabetes mellitus    Migraine    Schizophrenia ([place])    Past Surgical History:  Procedure Laterality Date   KNEE ARTHROSCOPY     x 2   KNEE SURGERY Left    Incision made and knee cleaned out   TUBAL LIGATION      Social History:  reports that she has been smoking cigarettes. She has a 29.00 pack-year smoking history. She has never used smokeless tobacco. She reports that she does not currently use  alcohol. She reports that she does not use drugs.   Allergies  Allergen Reactions   Tramadol Nausea Only and Rash   Wellbutrin [Bupropion] Other (See Comments)    rage   Nicotine Rash    Patient states she is allergic to the Nicotine patch and they cause her to break out in a rash.  She states she can smoke cigarettes.    Family History  Problem Relation Age of Onset   Bipolar disorder Mother    Hypertension Father    Diabetes Father       Prior to Admission medications   Medication Sig Start Date End Date Taking? Authorizing Provider  acetaminophen (TYLENOL) 325 MG tablet Take 2 tablets (650 mg total) by mouth every 4 (four) hours as needed for headache or mild pain. 08/29/22   Arrien, Jimmy Picket, MD  ASPIRIN LOW DOSE 81 MG tablet Take 1 tablet (81 mg total) by mouth daily. 08/23/22   Caren Griffins, MD  atorvastatin (LIPITOR) 20 MG tablet Take 1 tablet (20 mg total) by mouth daily. 08/23/22   Caren Griffins, MD  blood glucose meter kit and supplies Dispense based on patient and insurance preference. Use up to four times daily as directed. (FOR ICD-10 E10.9, E11.9). 01/06/18   Eugenie Filler, MD  bumetanide (BUMEX) 2 MG tablet Take 1 tablet (2 mg total) by mouth 2 (two) times daily. 08/29/22 09/28/22  Arrien, Jimmy Picket, MD  fluticasone-salmeterol (ADVAIR DISKUS) 250-50 MCG/ACT AEPB Inhale  1 puff into the lungs in the morning and at bedtime. Patient not taking: Reported on 08/27/2022 08/23/22   Caren Griffins, MD  insulin glargine (LANTUS SOLOSTAR) 100 UNIT/ML Solostar Pen Inject 20 Units into the skin in the morning. Do not take if blood sugar is <100. 08/29/22 09/28/22  Arrien, Jimmy Picket, MD  Insulin Pen Needle 32G X 4 MM MISC Inject 1 each into the skin in the morning. 08/29/22   Arrien, Jimmy Picket, MD  isosorbide mononitrate (IMDUR) 60 MG 24 hr tablet Take 1 tablet (60 mg total) by mouth daily. 08/23/22 09/22/22  Caren Griffins, MD  methimazole (TAPAZOLE)  5 MG tablet Take 1 tablet (5 mg total) by mouth daily for 21 days. 08/29/22 09/19/22  Arrien, Jimmy Picket, MD  metoprolol succinate (TOPROL-XL) 25 MG 24 hr tablet Take 1 tablet (25 mg total) by mouth daily. 08/23/22 09/22/22  Caren Griffins, MD  nitroGLYCERIN (NITROSTAT) 0.4 MG SL tablet Place 1 tablet (0.4 mg total) under the tongue every 5 (five) minutes as needed for chest pain. 08/23/22   Caren Griffins, MD  oxyCODONE-acetaminophen (PERCOCET) 10-325 MG tablet Take 1 tablet by mouth every 4 (four) hours as needed for pain. 08/23/22   Caren Griffins, MD  potassium chloride SA (KLOR-CON M) 20 MEQ tablet Take 1 tablet (20 mEq total) by mouth daily. Take with bumetanide. 08/29/22   Arrien, Jimmy Picket, MD  risperiDONE (RISPERDAL) 2 MG tablet Take one-half tablet in the morning and one tablet in the evening. 08/23/22   Caren Griffins, MD  rosuvastatin (CRESTOR) 20 MG tablet Take 1 tablet (20 mg total) by mouth daily. 08/30/22 09/29/22  Arrien, Jimmy Picket, MD  benztropine (COGENTIN) 1 MG tablet Take 1 tablet (1 mg total) by mouth 2 (two) times daily. 12/23/18 06/04/20  Johnn Hai, MD  insulin aspart (NOVOLOG) 100 UNIT/ML injection Inject 0-9 Units into the skin 3 (three) times daily with meals. Patient not taking: Reported on 06/03/2020 12/16/18 06/04/20  Antonieta Pert, MD    Physical Exam: BP 96/67   Pulse 90   Temp 98.1 F (36.7 C) (Oral)   Resp (!) 24   SpO2 97%   General: 52 y.o. year-old female well developed well nourished in no acute distress.  Alert and oriented x3. Cardiovascular: Regular rate and rhythm with no rubs or gallops.  No thyromegaly or JVD noted.  No lower extremity edema. 2/4 pulses in all 4 extremities. Respiratory: Clear to auscultation with no wheezes or rales. Good inspiratory effort. Abdomen: Soft nontender nondistended with normal bowel sounds x4 quadrants. Muskuloskeletal: No cyanosis, clubbing or edema noted bilaterally Neuro: CN II-XII intact, strength,  sensation, reflexes Skin: No ulcerative lesions noted or rashes Psychiatry: Judgement and insight appear normal. Mood is appropriate for condition and setting          Labs on Admission:  Basic Metabolic Panel: Recent Labs  Lab 08/24/22 0359 08/26/22 2225 08/26/22 2257 08/27/22 0754 08/27/22 0813 08/28/22 0645 08/29/22 0029 08/30/22 1256 08/30/22 1416 08/30/22 1417  NA 137 132*   < >  --    < > 137 134* 130* 131* 132*  K 3.8 4.3   < >  --    < > 4.1 4.4 4.4 4.3 4.3  CL 99 91*  --   --   --  100 101 94*  --  94*  CO2 27 28  --   --   --  28 28 23   --   --  GLUCOSE 130* 523*  --   --   --  227* 263* 605*  --  644*  BUN 23* 35*  --   --   --  31* 27* 24*  --  26*  CREATININE 1.03* 1.34*  --   --   --  1.21* 1.33* 1.46*  --  1.30*  CALCIUM 8.5* 8.7*  --   --   --  8.4* 8.2* 9.0  --   --   MG 2.0  --   --  2.4  --  2.0 2.2  --   --   --    < > = values in this interval not displayed.   Liver Function Tests: Recent Labs  Lab 08/24/22 0359 08/26/22 2225 08/28/22 0645 08/29/22 0029 08/30/22 1256  AST 8* 26 14* 12* 25  ALT 18 27 20 21  34  ALKPHOS 73 84 71 78 123  BILITOT 0.6 1.1 0.5 0.5 0.8  PROT 5.5* 6.2* 5.5* 5.3* 7.1  ALBUMIN 3.0* 3.8 3.2* 2.9* 4.1   Recent Labs  Lab 08/26/22 2225  LIPASE 25   No results for input(s): "AMMONIA" in the last 168 hours. CBC: Recent Labs  Lab 08/24/22 0359 08/26/22 2225 08/26/22 2257 08/28/22 0645 08/29/22 0029 08/30/22 1256 08/30/22 1416 08/30/22 1417  WBC 7.1 7.0  --  6.6 6.7 7.6  --   --   NEUTROABS  --  5.8  --  4.2 4.1  --   --   --   HGB 11.1* 11.3*   < > 11.1* 10.4* 12.9 13.3 13.3  HCT 34.3* 34.1*   < > 35.7* 32.5* 40.2 39.0 39.0  MCV 88.6 87.2  --  90.6 89.3 88.9  --   --   PLT 165 190  --  164 165 196  --   --    < > = values in this interval not displayed.   Cardiac Enzymes: No results for input(s): "CKTOTAL", "CKMB", "CKMBINDEX", "TROPONINI" in the last 168 hours.  BNP (last 3 results) Recent Labs     08/26/22 2225 08/27/22 0341 08/28/22 0645  BNP 1,426.0* 1,118.0* 882.2*    ProBNP (last 3 results) No results for input(s): "PROBNP" in the last 8760 hours.  CBG: Recent Labs  Lab 08/29/22 0011 08/29/22 0558 08/29/22 1115 08/30/22 1254 08/30/22 1659  GLUCAP 230* 178* 346* >600* 406*    Radiological Exams on Admission: DG Chest 2 View  Result Date: 08/30/2022 CLINICAL DATA:  Short this of breath, pain radiating to LEFT arm, chest pressure, and mid back pain for couple days EXAM: CHEST - 2 VIEW COMPARISON:  08/26/2022 FINDINGS: Upper normal heart size. Mediastinal contours and pulmonary vascularity normal. Lungs well expanded and clear. No pulmonary infiltrate, pleural effusion, or pneumothorax. No acute osseous findings. IMPRESSION: No acute abnormalities. Electronically Signed   By: Lavonia Dana M.D.   On: 08/30/2022 13:23    EKG: I independently viewed the EKG done and my findings are as followed: Sinus tachycardia 107.  Nonspecific ST-T changes.  QTc 488.  Assessment/Plan Present on Admission:  Hyperglycemia due to diabetes mellitus ([place])  Principal Problem:   Hyperglycemia due to diabetes mellitus ([place])  Hyperglycemia in the setting of type 2 diabetes, controlled. Likely secondary to noncompliance.  States did not pick up insulin from pharmacy. Restarted long-acting insulin and insulin sliding scale. Last hemoglobin A1c 11.0 on 08/27/2022. TOC consulted to assist with medications. Diabetes coordinator consulted for patient education  Recent NSTEMI HFrEF 20-25% from 2D echo done  on 08/27/2022 Was discharged yesterday Resume home regimen Monitor strict I's and O's and daily weight  Hypothyroidism Resume home methimazole  Schizophrenia Resume home home risperidone  Hyperlipidemia Resume home Crestor  CKD 3B At baseline Avoid nephrotoxic agents and hypotension Monitor urine output with strict I's and O's   DVT prophylaxis: Subcu Lovenox daily  Code Status:  Full code  Family Communication: None at bedside  Disposition Plan: Admitted to telemetry cardiac unit  Consults called: Diabetes coordinator.  Admission status: Observation status.   Status is: Observation    Kayleen Memos MD [facility] Pager [phone]  If 7PM-7AM, please contact night-coverage [email or web address] Password [email or web address]  08/30/2022, 7:47 PM

## 2022-08-30 NOTE — Progress Notes (Signed)
[facility] night cross cover note:   I was notified by RN of the patient's request for resumption of her home prn Percocet for her chronic back pain.  I subsequently resumed her home prn Percocet, per patient request.     Babs Bertin, DO Hospitalist

## 2022-08-30 NOTE — Plan of Care (Signed)
Problem: Education: Goal: Understanding of cardiac disease, CV risk reduction, and recovery process will improve Outcome: Progressing Goal: Individualized Educational Video(s) Outcome: Progressing   Problem: Activity: Goal: Ability to tolerate increased activity will improve Outcome: Progressing   Problem: Cardiac: Goal: Ability to achieve and maintain adequate cardiovascular perfusion will improve Outcome: Progressing   Problem: Health Behavior/Discharge Planning: Goal: Ability to safely manage health-related needs after discharge will improve Outcome: Progressing   Problem: Education: Goal: Ability to describe self-care measures that may prevent or decrease complications (Diabetes Survival Skills Education) will improve Outcome: Progressing Goal: Individualized Educational Video(s) Outcome: Progressing   Problem: Cardiac: Goal: Ability to maintain an adequate cardiac output will improve Outcome: Progressing   Problem: Health Behavior/Discharge Planning: Goal: Ability to identify and utilize available resources and services will improve Outcome: Progressing Goal: Ability to manage health-related needs will improve Outcome: Progressing   Problem: Fluid Volume: Goal: Ability to achieve a balanced intake and output will improve Outcome: Progressing   Problem: Metabolic: Goal: Ability to maintain appropriate glucose levels will improve Outcome: Progressing   Problem: Nutritional: Goal: Maintenance of adequate nutrition will improve Outcome: Progressing Goal: Maintenance of adequate weight for body size and type will improve Outcome: Progressing   Problem: Respiratory: Goal: Will regain and/or maintain adequate ventilation Outcome: Progressing   Problem: Urinary Elimination: Goal: Ability to achieve and maintain adequate renal perfusion and functioning will improve Outcome: Progressing   Problem: Education: Goal: Ability to describe self-care measures that may  prevent or decrease complications (Diabetes Survival Skills Education) will improve Outcome: Progressing Goal: Individualized Educational Video(s) Outcome: Progressing   Problem: Coping: Goal: Ability to adjust to condition or change in health will improve Outcome: Progressing   Problem: Fluid Volume: Goal: Ability to maintain a balanced intake and output will improve Outcome: Progressing   Problem: Health Behavior/Discharge Planning: Goal: Ability to identify and utilize available resources and services will improve Outcome: Progressing Goal: Ability to manage health-related needs will improve Outcome: Progressing   Problem: Metabolic: Goal: Ability to maintain appropriate glucose levels will improve Outcome: Progressing   Problem: Nutritional: Goal: Maintenance of adequate nutrition will improve Outcome: Progressing Goal: Progress toward achieving an optimal weight will improve Outcome: Progressing   Problem: Skin Integrity: Goal: Risk for impaired skin integrity will decrease Outcome: Progressing   Problem: Tissue Perfusion: Goal: Adequacy of tissue perfusion will improve Outcome: Progressing   Problem: Education: Goal: Knowledge of General Education information will improve Description: Including pain rating scale, medication(s)/side effects and non-pharmacologic comfort measures Outcome: Progressing   Problem: Health Behavior/Discharge Planning: Goal: Ability to manage health-related needs will improve Outcome: Progressing   Problem: Clinical Measurements: Goal: Ability to maintain clinical measurements within normal limits will improve Outcome: Progressing Goal: Will remain free from infection Outcome: Progressing Goal: Diagnostic test results will improve Outcome: Progressing Goal: Respiratory complications will improve Outcome: Progressing Goal: Cardiovascular complication will be avoided Outcome: Progressing   Problem: Activity: Goal: Risk for  activity intolerance will decrease Outcome: Progressing   Problem: Nutrition: Goal: Adequate nutrition will be maintained Outcome: Progressing   Problem: Coping: Goal: Level of anxiety will decrease Outcome: Progressing   Problem: Elimination: Goal: Will not experience complications related to bowel motility Outcome: Progressing Goal: Will not experience complications related to urinary retention Outcome: Progressing   Problem: Pain Managment: Goal: General experience of comfort will improve Outcome: Progressing   Problem: Safety: Goal: Ability to remain free from injury will improve Outcome: Progressing   Problem: Skin Integrity: Goal: Risk for impaired skin  integrity will decrease Outcome: Progressing

## 2022-08-30 NOTE — ED Notes (Signed)
Pt transported to xray 

## 2022-08-30 NOTE — ED Provider Triage Note (Signed)
Emergency Medicine Provider Triage Evaluation Note  LANIE KOCHMAN , a 52 y.o. female  was evaluated in triage.  Patient discharged yesterday with elevated blood sugar and NSTEMI.  Returns today because her blood sugar was in the 500s when checked at a homeless shelter.  Reports that she was supposed to be started on a medication but she was not.  Also complaining of some pain in the left side of her back and arm.  Took nitroglycerin 30 minutes prior to arrival and says that it did not help at all.  Said that her chest feels tight but not necessarily painful  Review of Systems  Positive:  Negative:   Physical Exam  BP 129/77 (BP Location: Right Arm)   Pulse (!) 114   Temp 98.9 F (37.2 C) (Oral)   Resp 18   SpO2 99%  Gen:   Awake, no distress   Resp:  Normal effort  MSK:   Moves extremities without difficulty  Other:  Reproducible tenderness to the left thoracic spine.  RRR  Medical Decision Making  Medically screening exam initiated at 12:52 PM.  Appropriate orders placed.  AVO VOCI was informed that the remainder of the evaluation will be completed by another provider, this initial triage assessment does not replace that evaluation, and the importance of remaining in the ED until their evaluation is complete.     Rhae Hammock, PA-C 08/30/22 1253

## 2022-08-30 NOTE — Congregational Nurse Program (Signed)
  Dept: [phone]   Congregational Nurse Program Note  Date of Encounter: 08/30/2022  Past Medical History: Past Medical History:  Diagnosis Date   Anxiety disorder    Asthma    Bipolar affective disorder ([place])    Blood transfusion without reported diagnosis    CHF (congestive heart failure) ([place])    Depression    Diabetes mellitus    Migraine    Schizophrenia ([place])     Encounter Details:  CNP Questionnaire - 08/30/22 1343       Questionnaire   Ask client: Do you give verbal consent for me to treat you today? Yes    Student Assistance [employer]    Location Patient Served  [facility]    Visit Setting with Client Phone/Text/Email;Organization    Patient Status Unhoused    Insurance Medicaid    Insurance/Financial Assistance Referral N/A    Medication Have Medication Insecurities    Medical Provider Yes    Screening Referrals Made N/A    Medical Referrals Made ED    Medical Appointment Made N/A    Recently w/o PCP, now 1st time PCP visit completed due to CNs referral or appointment made N/A    Food N/A    Transportation Need transportation assistance;Provided transportation assistance    Housing/Utilities N/A    Interpersonal Safety N/A    Interventions Advocate/Support;Reviewed Medications    Abnormal to Normal Screening Since Last CN Visit N/A    Screenings CN Performed Blood Pressure;Blood Glucose    Sent Client to Lab for: N/A    Did client attend any of the following based off CNs referral or appointments made? N/A    ED Visit Averted N/A    Life-Saving Intervention Made Yes            Client came to nurses office from [employer] appt. She asked for help explaining her medications she received from hospital discharge. Client had a large bag of medication. Went over each medication explaining what they are for and how to take. Client did not have anything for her blood sugar. She reports she was not given insulin to take home. Checked vitals and cbg. Blood pressure 119/81,  pulse (!) 110. CBG 563. Rechecked in NP office cbg 587. Client reports she has blurry vision. Called 911 and stayed with client. Called three times and was told client was on hold due to other emergencies. During this time client said she had chest pain and took nitroglycerin. Worked with an [place] and she drove client while Probation officer assisted in transportation to [facility] hospital.  Bernita Raisin RN CN

## 2022-08-31 DIAGNOSIS — E039 Hypothyroidism, unspecified: Secondary | ICD-10-CM | POA: Diagnosis present

## 2022-08-31 DIAGNOSIS — Z8249 Family history of ischemic heart disease and other diseases of the circulatory system: Secondary | ICD-10-CM | POA: Diagnosis not present

## 2022-08-31 DIAGNOSIS — F17210 Nicotine dependence, cigarettes, uncomplicated: Secondary | ICD-10-CM | POA: Diagnosis present

## 2022-08-31 DIAGNOSIS — F205 Residual schizophrenia: Secondary | ICD-10-CM | POA: Diagnosis present

## 2022-08-31 DIAGNOSIS — Z5901 Sheltered homelessness: Secondary | ICD-10-CM | POA: Diagnosis not present

## 2022-08-31 DIAGNOSIS — Z794 Long term (current) use of insulin: Secondary | ICD-10-CM | POA: Diagnosis not present

## 2022-08-31 DIAGNOSIS — Z7982 Long term (current) use of aspirin: Secondary | ICD-10-CM | POA: Diagnosis not present

## 2022-08-31 DIAGNOSIS — Z79899 Other long term (current) drug therapy: Secondary | ICD-10-CM | POA: Diagnosis not present

## 2022-08-31 DIAGNOSIS — G8929 Other chronic pain: Secondary | ICD-10-CM | POA: Diagnosis present

## 2022-08-31 DIAGNOSIS — J4489 Other specified chronic obstructive pulmonary disease: Secondary | ICD-10-CM | POA: Diagnosis present

## 2022-08-31 DIAGNOSIS — E1165 Type 2 diabetes mellitus with hyperglycemia: Secondary | ICD-10-CM | POA: Diagnosis present

## 2022-08-31 DIAGNOSIS — Z885 Allergy status to narcotic agent status: Secondary | ICD-10-CM | POA: Diagnosis not present

## 2022-08-31 DIAGNOSIS — N1832 Chronic kidney disease, stage 3b: Secondary | ICD-10-CM | POA: Diagnosis present

## 2022-08-31 DIAGNOSIS — I252 Old myocardial infarction: Secondary | ICD-10-CM | POA: Diagnosis not present

## 2022-08-31 DIAGNOSIS — I5022 Chronic systolic (congestive) heart failure: Secondary | ICD-10-CM | POA: Diagnosis present

## 2022-08-31 DIAGNOSIS — R739 Hyperglycemia, unspecified: Secondary | ICD-10-CM | POA: Diagnosis not present

## 2022-08-31 DIAGNOSIS — E1122 Type 2 diabetes mellitus with diabetic chronic kidney disease: Secondary | ICD-10-CM | POA: Diagnosis present

## 2022-08-31 DIAGNOSIS — F419 Anxiety disorder, unspecified: Secondary | ICD-10-CM | POA: Diagnosis present

## 2022-08-31 DIAGNOSIS — F319 Bipolar disorder, unspecified: Secondary | ICD-10-CM | POA: Diagnosis present

## 2022-08-31 DIAGNOSIS — Z91048 Other nonmedicinal substance allergy status: Secondary | ICD-10-CM | POA: Diagnosis not present

## 2022-08-31 DIAGNOSIS — Z888 Allergy status to other drugs, medicaments and biological substances status: Secondary | ICD-10-CM | POA: Diagnosis not present

## 2022-08-31 DIAGNOSIS — Z7951 Long term (current) use of inhaled steroids: Secondary | ICD-10-CM | POA: Diagnosis not present

## 2022-08-31 DIAGNOSIS — E0590 Thyrotoxicosis, unspecified without thyrotoxic crisis or storm: Secondary | ICD-10-CM | POA: Diagnosis present

## 2022-08-31 DIAGNOSIS — E785 Hyperlipidemia, unspecified: Secondary | ICD-10-CM | POA: Diagnosis present

## 2022-08-31 DIAGNOSIS — I130 Hypertensive heart and chronic kidney disease with heart failure and stage 1 through stage 4 chronic kidney disease, or unspecified chronic kidney disease: Secondary | ICD-10-CM | POA: Diagnosis present

## 2022-08-31 LAB — CBC
HCT: 35.7 % — ABNORMAL LOW (ref 36.0–46.0)
Hemoglobin: 11.2 g/dL — ABNORMAL LOW (ref 12.0–15.0)
MCH: 28 pg (ref 26.0–34.0)
MCHC: 31.4 g/dL (ref 30.0–36.0)
MCV: 89.3 fL (ref 80.0–100.0)
Platelets: 174 10*3/uL (ref 150–400)
RBC: 4 MIL/uL (ref 3.87–5.11)
RDW: 15.6 % — ABNORMAL HIGH (ref 11.5–15.5)
WBC: 7.3 10*3/uL (ref 4.0–10.5)
nRBC: 0 % (ref 0.0–0.2)

## 2022-08-31 LAB — GLUCOSE, CAPILLARY
Glucose-Capillary: 235 mg/dL — ABNORMAL HIGH (ref 70–99)
Glucose-Capillary: 368 mg/dL — ABNORMAL HIGH (ref 70–99)
Glucose-Capillary: 268 mg/dL — ABNORMAL HIGH (ref 70–99)
Glucose-Capillary: 210 mg/dL — ABNORMAL HIGH (ref 70–99)

## 2022-08-31 LAB — COMPREHENSIVE METABOLIC PANEL
ALT: 25 U/L (ref 0–44)
AST: 13 U/L — ABNORMAL LOW (ref 15–41)
Albumin: 3.3 g/dL — ABNORMAL LOW (ref 3.5–5.0)
Alkaline Phosphatase: 92 U/L (ref 38–126)
Anion gap: 7 (ref 5–15)
BUN: 25 mg/dL — ABNORMAL HIGH (ref 6–20)
CO2: 28 mmol/L (ref 22–32)
Calcium: 8.4 mg/dL — ABNORMAL LOW (ref 8.9–10.3)
Chloride: 97 mmol/L — ABNORMAL LOW (ref 98–111)
Creatinine, Ser: 1.37 mg/dL — ABNORMAL HIGH (ref 0.44–1.00)
GFR, Estimated: 46 mL/min — ABNORMAL LOW (ref >60)
Glucose, Bld: 371 mg/dL — ABNORMAL HIGH (ref 70–99)
Potassium: 4 mmol/L (ref 3.5–5.1)
Sodium: 132 mmol/L — ABNORMAL LOW (ref 135–145)
Total Bilirubin: 0.5 mg/dL (ref 0.3–1.2)
Total Protein: 5.8 g/dL — ABNORMAL LOW (ref 6.5–8.1)

## 2022-08-31 LAB — PHOSPHORUS: Phosphorus: 5.2 mg/dL — ABNORMAL HIGH (ref 2.5–4.6)

## 2022-08-31 LAB — MAGNESIUM: Magnesium: 2.1 mg/dL (ref 1.7–2.4)

## 2022-08-31 MED ORDER — METHOCARBAMOL 500 MG PO TABS
500.0000 mg | ORAL_TABLET | Freq: Three times a day (TID) | ORAL | Status: DC
  Administered 2022-08-31 – 2022-09-02 (×7): 500 mg via ORAL
  Filled 2022-08-31 (×7): qty 1

## 2022-08-31 NOTE — Inpatient Diabetes Management (Signed)
Inpatient Diabetes Program Recommendations  AACE/ADA: New Consensus Statement on Inpatient Glycemic Control (2015)  Target Ranges:  Prepandial:   less than 140 mg/dL      Peak postprandial:   less than 180 mg/dL (1-2 hours)      Critically ill patients:  140 - 180 mg/dL   Lab Results  Component Value Date   GLUCAP 235 (H) 08/31/2022   HGBA1C 11.0 (H) 08/27/2022    Review of Glycemic Control  Latest Reference Range & Units 08/30/22 21:51 08/31/22 05:55  Glucose-Capillary 70 - 99 mg/dL 375 (H) 235 (H)  (H): Data is abnormally high Diabetes history: DM2 Outpatient Diabetes medications: Lantus 20 units QHS Current orders for Inpatient glycemic control: Semglee 10 units BID, Novolog 0-15 units TID & HS  Inpatient Diabetes Program Recommendations:    TOC consult placed.   Addendum: Spoke with patient regarding outpatient diabetes management. Has not been taking insulin due to issue with transportation. If has needed medications patient reports she will take them. Reviewed patient's current A1c of 11.0%. Explained what a A1c is and what it measures. Also reviewed goal A1c with patient, importance of good glucose control @ home, and blood sugar goals. Reviewed patho of DM, need for insulin, survival skills, interventions, vascular changes and commorbidities.  Patient reports living with her son at this time. He does not have access to transportation either.  TOC to help assist with barriers. Patient has no further questions at this time.  Thanks, Bronson Curb, MSN, RNC-OB Diabetes Coordinator [phone] (8a-5p)

## 2022-08-31 NOTE — Progress Notes (Signed)
PROGRESS NOTE    [name] [name]  [id number] DOB: 01/08/1971 DOA: 08/30/2022 PCP: Patient, No Pcp Per   Brief Narrative:  This 52 yrs old female with PMH significant for uncontrolled type 2 diabetes, hypertension, CKD stage IIIb, asthma, schizophrenia, HFrEF 20 to 25%, recently admitted for NSTEMI discharged yesterday, returned today with severe hyperglycemia from her shelter.  She endorses chest tightness that has been present since prior to her admission for NSTEMI.  She did not pick her insulin from the pharmacy.  In the ED blood sugar was found to be 600, troponin is elevated but downtrending.  Chest pain has improved.  Patient restarted on long-acting and sliding scale.  Admitted for further evaluation.  Assessment & Plan:   Principal Problem:   Hyperglycemia due to diabetes mellitus ([place])   Hyperglycemia in the setting of type 2 diabetes: Likely secondary to noncompliance.She did not pick her insulin from pharmacy. Restarted long-acting insulin and insulin sliding scale. Last hemoglobin A1c 11.0 on 08/27/2022 TOC consulted to assist with medication. Diabetic coordinator consulted for patient education.  Recent NSTEMI: HFrEF 20 to 25%. She was discharged 1 day before readmission. Continue Bumex 2 mg daily Continue Imdur 60 mg daily Continue metoprolol 25 mg daily Monitor strict I's and O's and daily weight   Hyperthyroidism: Continue methimazole 5 mg daily Continue metoprolol 25 mg daily   Schizophrenia: Continue Risperdal 1 mg daily and 2 mg at bedtime.  Hyperlipidemia: Continue Crestor 20 mg daily.  CKD IIIb: At baseline: Avoid nephrotoxic agents and hypotension Monitor urine output with strict I's and O's   DVT prophylaxis: Lovenox Code Status: Full code Family Communication: No family at bed side. Disposition Plan:     Status is: Observation The patient remains OBS appropriate and will d/c before 2 midnights. Admitted for uncontrolled blood glucose  secondary to noncompliance.  TOC consulted to help with medications.    Consultants:  None  Procedures: None  Antimicrobials: None  Subjective: Patient was seen and examined at bedside.  Overnight events noted.   Patient reports doing better,  still reports feeling very ill and drowsy.  Denies any chest pain.  Objective: Vitals:   08/31/22 0409 08/31/22 0900 08/31/22 0906 08/31/22 1229  BP: 116/76 126/71 (!) 106/58 96/60  Pulse: 89  88 94  Resp: 20  20 20   Temp: 97.8 F (36.6 C)  97.7 F (36.5 C) 98.2 F (36.8 C)  TempSrc: Oral  Oral Oral  SpO2: 94%  94% 93%  Weight:      Height:        Intake/Output Summary (Last 24 hours) at 08/31/2022 1303 Last data filed at 08/31/2022 1229 Gross per 24 hour  Intake 480 ml  Output 300 ml  Net 180 ml   Filed Weights   08/30/22 2143 08/31/22 0011  Weight: 80.8 kg 80.8 kg    Examination:  General exam: Appears calm and comfortable, not in any distress Respiratory system: Clear to auscultation. Respiratory effort normal.  RR 16 Cardiovascular system: S1 & S2 heard, RRR. No JVD, murmurs, rubs, gallops or clicks. No pedal edema. Gastrointestinal system: Abdomen is soft, non tender, non distended, BS+ Central nervous system: Alert and oriented x 3. No focal neurological deficits. Extremities: No edema, no cyanosis, no clubbing. Skin: No rashes, lesions or ulcers Psychiatry: Judgement and insight appear normal. Mood & affect appropriate.     Data Reviewed: I have personally reviewed following labs and imaging studies  CBC: Recent Labs  Lab 08/26/22 2225 08/26/22 2257  08/28/22 0645 08/29/22 0029 08/30/22 1256 08/30/22 1416 08/30/22 1417 08/30/22 2153 08/31/22 0031  WBC 7.0  --  6.6 6.7 7.6  --   --  7.8 7.3  NEUTROABS 5.8  --  4.2 4.1  --   --   --   --   --   HGB 11.3*   < > 11.1* 10.4* 12.9 13.3 13.3 12.1 11.2*  HCT 34.1*   < > 35.7* 32.5* 40.2 39.0 39.0 36.7 35.7*  MCV 87.2  --  90.6 89.3 88.9  --   --  86.6 89.3   PLT 190  --  164 165 196  --   --  190 174   < > = values in this interval not displayed.   Basic Metabolic Panel: Recent Labs  Lab 08/26/22 2225 08/26/22 2257 08/27/22 0754 08/27/22 0813 08/28/22 0645 08/29/22 0029 08/30/22 1256 08/30/22 1416 08/30/22 1417 08/30/22 2153 08/31/22 0031  NA 132*   < >  --    < > 137 134* 130* 131* 132*  --  132*  K 4.3   < >  --    < > 4.1 4.4 4.4 4.3 4.3  --  4.0  CL 91*  --   --   --  100 101 94*  --  94*  --  97*  CO2 28  --   --   --  28 28 23   --   --   --  28  GLUCOSE 523*  --   --   --  227* 263* 605*  --  644*  --  371*  BUN 35*  --   --   --  31* 27* 24*  --  26*  --  25*  CREATININE 1.34*  --   --   --  1.21* 1.33* 1.46*  --  1.30* 1.32* 1.37*  CALCIUM 8.7*  --   --   --  8.4* 8.2* 9.0  --   --   --  8.4*  MG  --   --  2.4  --  2.0 2.2  --   --   --   --  2.1  PHOS  --   --   --   --   --   --   --   --   --   --  5.2*   < > = values in this interval not displayed.   GFR: Estimated Creatinine Clearance: 50.9 mL/min (A) (by C-G formula based on SCr of 1.37 mg/dL (H)). Liver Function Tests: Recent Labs  Lab 08/26/22 2225 08/28/22 0645 08/29/22 0029 08/30/22 1256 08/31/22 0031  AST 26 14* 12* 25 13*  ALT 27 20 21  34 25  ALKPHOS 84 71 78 123 92  BILITOT 1.1 0.5 0.5 0.8 0.5  PROT 6.2* 5.5* 5.3* 7.1 5.8*  ALBUMIN 3.8 3.2* 2.9* 4.1 3.3*   Recent Labs  Lab 08/26/22 2225  LIPASE 25   No results for input(s): "AMMONIA" in the last 168 hours. Coagulation Profile: No results for input(s): "INR", "PROTIME" in the last 168 hours. Cardiac Enzymes: No results for input(s): "CKTOTAL", "CKMB", "CKMBINDEX", "TROPONINI" in the last 168 hours. BNP (last 3 results) No results for input(s): "PROBNP" in the last 8760 hours. HbA1C: No results for input(s): "HGBA1C" in the last 72 hours. CBG: Recent Labs  Lab 08/30/22 1254 08/30/22 1659 08/30/22 2151 08/31/22 0555 08/31/22 1135  GLUCAP >600* 406* 375* 235* 368*   Lipid  Profile: No results for  input(s): "CHOL", "HDL", "LDLCALC", "TRIG", "CHOLHDL", "LDLDIRECT" in the last 72 hours. Thyroid Function Tests: No results for input(s): "TSH", "T4TOTAL", "FREET4", "T3FREE", "THYROIDAB" in the last 72 hours. Anemia Panel: No results for input(s): "VITAMINB12", "FOLATE", "FERRITIN", "TIBC", "IRON", "RETICCTPCT" in the last 72 hours. Sepsis Labs: No results for input(s): "PROCALCITON", "LATICACIDVEN" in the last 168 hours.  Recent Results (from the past 240 hour(s))  Resp panel by RT-PCR (RSV, Flu A&B, Covid) Anterior Nasal Swab     Status: None   Collection Time: 08/26/22 10:22 PM   Specimen: Anterior Nasal Swab  Result Value Ref Range Status   SARS Coronavirus 2 by RT PCR NEGATIVE NEGATIVE Final   Influenza A by PCR NEGATIVE NEGATIVE Final   Influenza B by PCR NEGATIVE NEGATIVE Final    Comment: (NOTE) The Xpert Xpress SARS-CoV-2/FLU/RSV plus assay is intended as an aid in the diagnosis of influenza from Nasopharyngeal swab specimens and should not be used as a sole basis for treatment. Nasal washings and aspirates are unacceptable for Xpert Xpress SARS-CoV-2/FLU/RSV testing.  Fact Sheet for Patients: EntrepreneurPulse.com.au  Fact Sheet for Healthcare Providers: IncredibleEmployment.be  This test is not yet approved or cleared by the [place] FDA and has been authorized for detection and/or diagnosis of SARS-CoV-2 by FDA under an Emergency Use Authorization (EUA). This EUA will remain in effect (meaning this test can be used) for the duration of the COVID-19 declaration under Section 564(b)(1) of the Act, 21 U.S.C. section 360bbb-3(b)(1), unless the authorization is terminated or revoked.     Resp Syncytial Virus by PCR NEGATIVE NEGATIVE Final    Comment: (NOTE) Fact Sheet for Patients: EntrepreneurPulse.com.au  Fact Sheet for Healthcare  Providers: IncredibleEmployment.be  This test is not yet approved or cleared by the [place] FDA and has been authorized for detection and/or diagnosis of SARS-CoV-2 by FDA under an Emergency Use Authorization (EUA). This EUA will remain in effect (meaning this test can be used) for the duration of the COVID-19 declaration under Section 564(b)(1) of the Act, 21 U.S.C. section 360bbb-3(b)(1), unless the authorization is terminated or revoked.  Performed at [facility], [place] [address], [place], [place] [address]     Radiology Studies: DG Chest 2 View  Result Date: 08/30/2022 CLINICAL DATA:  Short this of breath, pain radiating to LEFT arm, chest pressure, and mid back pain for couple days EXAM: CHEST - 2 VIEW COMPARISON:  08/26/2022 FINDINGS: Upper normal heart size. Mediastinal contours and pulmonary vascularity normal. Lungs well expanded and clear. No pulmonary infiltrate, pleural effusion, or pneumothorax. No acute osseous findings. IMPRESSION: No acute abnormalities. Electronically Signed   By: Lavonia Dana M.D.   On: 08/30/2022 13:23    Scheduled Meds:  aspirin EC  81 mg Oral Daily   bumetanide  2 mg Oral Daily   enoxaparin (LOVENOX) injection  40 mg Subcutaneous Q24H   insulin aspart  0-15 Units Subcutaneous TID WC   insulin aspart  0-5 Units Subcutaneous QHS   insulin glargine-yfgn  10 Units Subcutaneous BID   isosorbide mononitrate  60 mg Oral Daily   methimazole  5 mg Oral Daily   metoprolol succinate  25 mg Oral Daily   potassium chloride SA  20 mEq Oral Daily   risperiDONE  1 mg Oral Daily   risperiDONE  2 mg Oral QHS   rosuvastatin  20 mg Oral Daily   Continuous Infusions:   LOS: 0 days    Time spent: 50 mins    Rakeem Colley  Shelly Coss, MD [facility]   If 7PM-7AM, please contact night-coverage

## 2022-09-01 DIAGNOSIS — E1165 Type 2 diabetes mellitus with hyperglycemia: Secondary | ICD-10-CM | POA: Diagnosis not present

## 2022-09-01 LAB — GLUCOSE, CAPILLARY
Glucose-Capillary: 276 mg/dL — ABNORMAL HIGH (ref 70–99)
Glucose-Capillary: 188 mg/dL — ABNORMAL HIGH (ref 70–99)
Glucose-Capillary: 240 mg/dL — ABNORMAL HIGH (ref 70–99)
Glucose-Capillary: 210 mg/dL — ABNORMAL HIGH (ref 70–99)

## 2022-09-01 LAB — BASIC METABOLIC PANEL
Anion gap: 6 (ref 5–15)
BUN: 30 mg/dL — ABNORMAL HIGH (ref 6–20)
CO2: 24 mmol/L (ref 22–32)
Calcium: 8.1 mg/dL — ABNORMAL LOW (ref 8.9–10.3)
Chloride: 104 mmol/L (ref 98–111)
Creatinine, Ser: 1.11 mg/dL — ABNORMAL HIGH (ref 0.44–1.00)
GFR, Estimated: 59 mL/min — ABNORMAL LOW (ref >60)
Glucose, Bld: 238 mg/dL — ABNORMAL HIGH (ref 70–99)
Potassium: 4.3 mmol/L (ref 3.5–5.1)
Sodium: 134 mmol/L — ABNORMAL LOW (ref 135–145)

## 2022-09-01 MED ORDER — SODIUM CHLORIDE 0.9 % IV BOLUS
500.0000 mL | Freq: Once | INTRAVENOUS | Status: AC
  Administered 2022-09-01: 500 mL via INTRAVENOUS

## 2022-09-01 NOTE — Plan of Care (Signed)
Pt not compliant with diet, continues to ask for graham crackers but she is on a carb modified diet. Needs frequent reminders about her diet.  Complaining about constant back pain and has to wait for six hour increments to be medicated. No other concerns at this time. Continue to monitor.   Problem: Education: Goal: Understanding of cardiac disease, CV risk reduction, and recovery process will improve Outcome: Progressing Goal: Individualized Educational Video(s) Outcome: Progressing   Problem: Activity: Goal: Ability to tolerate increased activity will improve Outcome: Progressing   Problem: Cardiac: Goal: Ability to achieve and maintain adequate cardiovascular perfusion will improve Outcome: Progressing   Problem: Health Behavior/Discharge Planning: Goal: Ability to safely manage health-related needs after discharge will improve Outcome: Progressing   Problem: Education: Goal: Ability to describe self-care measures that may prevent or decrease complications (Diabetes Survival Skills Education) will improve Outcome: Progressing Goal: Individualized Educational Video(s) Outcome: Progressing   Problem: Cardiac: Goal: Ability to maintain an adequate cardiac output will improve Outcome: Progressing   Problem: Health Behavior/Discharge Planning: Goal: Ability to identify and utilize available resources and services will improve Outcome: Progressing Goal: Ability to manage health-related needs will improve Outcome: Progressing   Problem: Fluid Volume: Goal: Ability to achieve a balanced intake and output will improve Outcome: Progressing   Problem: Metabolic: Goal: Ability to maintain appropriate glucose levels will improve Outcome: Progressing   Problem: Nutritional: Goal: Maintenance of adequate nutrition will improve Outcome: Progressing Goal: Maintenance of adequate weight for body size and type will improve Outcome: Progressing   Problem: Respiratory: Goal: Will  regain and/or maintain adequate ventilation Outcome: Progressing   Problem: Urinary Elimination: Goal: Ability to achieve and maintain adequate renal perfusion and functioning will improve Outcome: Progressing   Problem: Education: Goal: Ability to describe self-care measures that may prevent or decrease complications (Diabetes Survival Skills Education) will improve Outcome: Progressing Goal: Individualized Educational Video(s) Outcome: Progressing   Problem: Coping: Goal: Ability to adjust to condition or change in health will improve Outcome: Progressing   Problem: Fluid Volume: Goal: Ability to maintain a balanced intake and output will improve Outcome: Progressing   Problem: Health Behavior/Discharge Planning: Goal: Ability to identify and utilize available resources and services will improve Outcome: Progressing Goal: Ability to manage health-related needs will improve Outcome: Progressing   Problem: Metabolic: Goal: Ability to maintain appropriate glucose levels will improve Outcome: Progressing   Problem: Nutritional: Goal: Maintenance of adequate nutrition will improve Outcome: Progressing Goal: Progress toward achieving an optimal weight will improve Outcome: Progressing   Problem: Skin Integrity: Goal: Risk for impaired skin integrity will decrease Outcome: Progressing   Problem: Tissue Perfusion: Goal: Adequacy of tissue perfusion will improve Outcome: Progressing   Problem: Education: Goal: Knowledge of General Education information will improve Description: Including pain rating scale, medication(s)/side effects and non-pharmacologic comfort measures Outcome: Progressing   Problem: Health Behavior/Discharge Planning: Goal: Ability to manage health-related needs will improve Outcome: Progressing   Problem: Clinical Measurements: Goal: Ability to maintain clinical measurements within normal limits will improve Outcome: Progressing Goal: Will remain  free from infection Outcome: Progressing Goal: Diagnostic test results will improve Outcome: Progressing Goal: Respiratory complications will improve Outcome: Progressing Goal: Cardiovascular complication will be avoided Outcome: Progressing   Problem: Activity: Goal: Risk for activity intolerance will decrease Outcome: Progressing   Problem: Nutrition: Goal: Adequate nutrition will be maintained Outcome: Progressing   Problem: Coping: Goal: Level of anxiety will decrease Outcome: Progressing   Problem: Elimination: Goal: Will not experience complications related to bowel  motility Outcome: Progressing Goal: Will not experience complications related to urinary retention Outcome: Progressing   Problem: Pain Managment: Goal: General experience of comfort will improve Outcome: Progressing   Problem: Safety: Goal: Ability to remain free from injury will improve Outcome: Progressing   Problem: Skin Integrity: Goal: Risk for impaired skin integrity will decrease Outcome: Progressing

## 2022-09-01 NOTE — Progress Notes (Signed)
PROGRESS NOTE    [name] [name]  [id number] DOB: 11/21/1970 DOA: 08/30/2022 PCP: Patient, No Pcp Per   Brief Narrative:  This 52 yrs old female with PMH significant for uncontrolled type 2 diabetes, hypertension, CKD stage IIIb, asthma, schizophrenia, HFrEF 20 to 25%, recently admitted for NSTEMI discharged yesterday, returned today with severe hyperglycemia from her shelter.  She endorses chest tightness that has been present since prior to her admission for NSTEMI.  She did not pick her insulin from the pharmacy.  In the ED blood sugar was found to be 600, troponin is elevated but downtrending.  Chest pain has improved.  Patient restarted on long-acting and sliding scale.  Admitted for further evaluation.  Assessment & Plan:   Principal Problem:   Hyperglycemia due to diabetes mellitus ([place])   Hyperglycemia in the setting of type 2 diabetes: Likely secondary to noncompliance.She did not pick her insulin from pharmacy. Restarted long-acting insulin and insulin sliding scale. Last hemoglobin A1c 11.0 on 08/27/2022 TOC consulted to assist with medications. Diabetic coordinator consulted for patient education.  Recent NSTEMI: HFrEF 20 to 25%. She was discharged 1 day before readmission. Continue Bumex 2 mg daily Continue Imdur 60 mg daily Continue metoprolol 25 mg daily Monitor strict I's and O's and daily weight   Hyperthyroidism: Continue methimazole 5 mg daily Continue metoprolol 25 mg daily   Schizophrenia: Continue Risperdal 1 mg daily and 2 mg at bedtime.  Hyperlipidemia: Continue Crestor 20 mg daily.  CKD IIIb: At baseline: Avoid nephrotoxic agents and hypotension Monitor urine output with strict I's and O's.  Generalized weakness: Patient continues to report generalized weakness, dizziness. PT and OT evaluation   DVT prophylaxis: Lovenox Code Status: Full code Family Communication: No family at bed side. Disposition Plan:    Status is:  Inpatient Remains inpatient appropriate because:    Admitted for uncontrolled blood glucose secondary to noncompliance.  TOC consulted to help with medications.    Consultants:  None  Procedures: None  Antimicrobials: None  Subjective: Patient was seen and examined at bedside.Overnight events noted.   Patient continues to report dizziness, RN reports patient was unsteady while walking. Denies any chest pain or shortness of breath.  Objective: Vitals:   09/01/22 0339 09/01/22 0730 09/01/22 0914 09/01/22 1022  BP: 98/79 101/77 (!) 87/64 122/62  Pulse:  93    Resp: 18 18    Temp: 97.9 F (36.6 C) 97.8 F (36.6 C)    TempSrc: Oral Oral    SpO2: 94% 95%    Weight:      Height:        Intake/Output Summary (Last 24 hours) at 09/01/2022 1043 Last data filed at 09/01/2022 0900 Gross per 24 hour  Intake 1320 ml  Output 1200 ml  Net 120 ml   Filed Weights   08/30/22 2143 08/31/22 0011 09/01/22 0028  Weight: 80.8 kg 80.8 kg 83.2 kg    Examination:  General exam: Appears comfortable, not in any acute distress. Respiratory system: Clear to auscultation. Respiratory effort normal.  RR 14 Cardiovascular system: S1 & S2 heard, regular rate and rhythm, no murmur. Gastrointestinal system: Abdomen is soft, non tender, non distended, BS+ Central nervous system: Alert and oriented x 3. No focal neurological deficits. Extremities: No edema, no cyanosis, no clubbing. Skin: No rashes, lesions or ulcers Psychiatry: Judgement and insight appear normal. Mood & affect appropriate.     Data Reviewed: I have personally reviewed following labs and imaging studies  CBC: Recent Labs  Lab 08/26/22 2225 08/26/22 2257 08/28/22 0645 08/29/22 0029 08/30/22 1256 08/30/22 1416 08/30/22 1417 08/30/22 2153 08/31/22 0031  WBC 7.0  --  6.6 6.7 7.6  --   --  7.8 7.3  NEUTROABS 5.8  --  4.2 4.1  --   --   --   --   --   HGB 11.3*   < > 11.1* 10.4* 12.9 13.3 13.3 12.1 11.2*  HCT 34.1*   < >  35.7* 32.5* 40.2 39.0 39.0 36.7 35.7*  MCV 87.2  --  90.6 89.3 88.9  --   --  86.6 89.3  PLT 190  --  164 165 196  --   --  190 174   < > = values in this interval not displayed.   Basic Metabolic Panel: Recent Labs  Lab 08/26/22 2225 08/26/22 2257 08/27/22 0754 08/27/22 0813 08/28/22 0645 08/29/22 0029 08/30/22 1256 08/30/22 1416 08/30/22 1417 08/30/22 2153 08/31/22 0031  NA 132*   < >  --    < > 137 134* 130* 131* 132*  --  132*  K 4.3   < >  --    < > 4.1 4.4 4.4 4.3 4.3  --  4.0  CL 91*  --   --   --  100 101 94*  --  94*  --  97*  CO2 28  --   --   --  28 28 23   --   --   --  28  GLUCOSE 523*  --   --   --  227* 263* 605*  --  644*  --  371*  BUN 35*  --   --   --  31* 27* 24*  --  26*  --  25*  CREATININE 1.34*  --   --   --  1.21* 1.33* 1.46*  --  1.30* 1.32* 1.37*  CALCIUM 8.7*  --   --   --  8.4* 8.2* 9.0  --   --   --  8.4*  MG  --   --  2.4  --  2.0 2.2  --   --   --   --  2.1  PHOS  --   --   --   --   --   --   --   --   --   --  5.2*   < > = values in this interval not displayed.   GFR: Estimated Creatinine Clearance: 51.7 mL/min (A) (by C-G formula based on SCr of 1.37 mg/dL (H)). Liver Function Tests: Recent Labs  Lab 08/26/22 2225 08/28/22 0645 08/29/22 0029 08/30/22 1256 08/31/22 0031  AST 26 14* 12* 25 13*  ALT 27 20 21  34 25  ALKPHOS 84 71 78 123 92  BILITOT 1.1 0.5 0.5 0.8 0.5  PROT 6.2* 5.5* 5.3* 7.1 5.8*  ALBUMIN 3.8 3.2* 2.9* 4.1 3.3*   Recent Labs  Lab 08/26/22 2225  LIPASE 25   No results for input(s): "AMMONIA" in the last 168 hours. Coagulation Profile: No results for input(s): "INR", "PROTIME" in the last 168 hours. Cardiac Enzymes: No results for input(s): "CKTOTAL", "CKMB", "CKMBINDEX", "TROPONINI" in the last 168 hours. BNP (last 3 results) No results for input(s): "PROBNP" in the last 8760 hours. HbA1C: No results for input(s): "HGBA1C" in the last 72 hours. CBG: Recent Labs  Lab 08/31/22 0555 08/31/22 1135  08/31/22 1613 08/31/22 2103 09/01/22 0600  GLUCAP 235* 368* 268* 210* 276*  Lipid Profile: No results for input(s): "CHOL", "HDL", "LDLCALC", "TRIG", "CHOLHDL", "LDLDIRECT" in the last 72 hours. Thyroid Function Tests: No results for input(s): "TSH", "T4TOTAL", "FREET4", "T3FREE", "THYROIDAB" in the last 72 hours. Anemia Panel: No results for input(s): "VITAMINB12", "FOLATE", "FERRITIN", "TIBC", "IRON", "RETICCTPCT" in the last 72 hours. Sepsis Labs: No results for input(s): "PROCALCITON", "LATICACIDVEN" in the last 168 hours.  Recent Results (from the past 240 hour(s))  Resp panel by RT-PCR (RSV, Flu A&B, Covid) Anterior Nasal Swab     Status: None   Collection Time: 08/26/22 10:22 PM   Specimen: Anterior Nasal Swab  Result Value Ref Range Status   SARS Coronavirus 2 by RT PCR NEGATIVE NEGATIVE Final   Influenza A by PCR NEGATIVE NEGATIVE Final   Influenza B by PCR NEGATIVE NEGATIVE Final    Comment: (NOTE) The Xpert Xpress SARS-CoV-2/FLU/RSV plus assay is intended as an aid in the diagnosis of influenza from Nasopharyngeal swab specimens and should not be used as a sole basis for treatment. Nasal washings and aspirates are unacceptable for Xpert Xpress SARS-CoV-2/FLU/RSV testing.  Fact Sheet for Patients: EntrepreneurPulse.com.au  Fact Sheet for Healthcare Providers: IncredibleEmployment.be  This test is not yet approved or cleared by the [place] FDA and has been authorized for detection and/or diagnosis of SARS-CoV-2 by FDA under an Emergency Use Authorization (EUA). This EUA will remain in effect (meaning this test can be used) for the duration of the COVID-19 declaration under Section 564(b)(1) of the Act, 21 U.S.C. section 360bbb-3(b)(1), unless the authorization is terminated or revoked.     Resp Syncytial Virus by PCR NEGATIVE NEGATIVE Final    Comment: (NOTE) Fact Sheet for  Patients: EntrepreneurPulse.com.au  Fact Sheet for Healthcare Providers: IncredibleEmployment.be  This test is not yet approved or cleared by the [place] FDA and has been authorized for detection and/or diagnosis of SARS-CoV-2 by FDA under an Emergency Use Authorization (EUA). This EUA will remain in effect (meaning this test can be used) for the duration of the COVID-19 declaration under Section 564(b)(1) of the Act, 21 U.S.C. section 360bbb-3(b)(1), unless the authorization is terminated or revoked.  Performed at [facility], [place] [address], [place], [place] [address]     Radiology Studies: DG Chest 2 View  Result Date: 08/30/2022 CLINICAL DATA:  Short this of breath, pain radiating to LEFT arm, chest pressure, and mid back pain for couple days EXAM: CHEST - 2 VIEW COMPARISON:  08/26/2022 FINDINGS: Upper normal heart size. Mediastinal contours and pulmonary vascularity normal. Lungs well expanded and clear. No pulmonary infiltrate, pleural effusion, or pneumothorax. No acute osseous findings. IMPRESSION: No acute abnormalities. Electronically Signed   By: Lavonia Dana M.D.   On: 08/30/2022 13:23    Scheduled Meds:  aspirin EC  81 mg Oral Daily   bumetanide  2 mg Oral Daily   enoxaparin (LOVENOX) injection  40 mg Subcutaneous Q24H   insulin aspart  0-15 Units Subcutaneous TID WC   insulin aspart  0-5 Units Subcutaneous QHS   insulin glargine-yfgn  10 Units Subcutaneous BID   isosorbide mononitrate  60 mg Oral Daily   methimazole  5 mg Oral Daily   methocarbamol  500 mg Oral TID   metoprolol succinate  25 mg Oral Daily   potassium chloride SA  20 mEq Oral Daily   risperiDONE  1 mg Oral Daily   risperiDONE  2 mg Oral QHS   rosuvastatin  20 mg Oral Daily   Continuous Infusions:   LOS:  1 day    Time spent: 35 mins    Duard Brady, MD [facility]   If 7PM-7AM, please contact night-coverage

## 2022-09-02 ENCOUNTER — Other Ambulatory Visit (HOSPITAL_COMMUNITY): Payer: Self-pay

## 2022-09-02 ENCOUNTER — Encounter: Payer: Medicaid Other | Admitting: Student

## 2022-09-02 DIAGNOSIS — E1165 Type 2 diabetes mellitus with hyperglycemia: Secondary | ICD-10-CM | POA: Diagnosis not present

## 2022-09-02 LAB — BASIC METABOLIC PANEL
Anion gap: 5 (ref 5–15)
BUN: 28 mg/dL — ABNORMAL HIGH (ref 6–20)
CO2: 25 mmol/L (ref 22–32)
Calcium: 8.1 mg/dL — ABNORMAL LOW (ref 8.9–10.3)
Chloride: 107 mmol/L (ref 98–111)
Creatinine, Ser: 1.37 mg/dL — ABNORMAL HIGH (ref 0.44–1.00)
GFR, Estimated: 46 mL/min — ABNORMAL LOW (ref >60)
Glucose, Bld: 223 mg/dL — ABNORMAL HIGH (ref 70–99)
Potassium: 4.5 mmol/L (ref 3.5–5.1)
Sodium: 137 mmol/L (ref 135–145)

## 2022-09-02 LAB — GLUCOSE, CAPILLARY
Glucose-Capillary: 197 mg/dL — ABNORMAL HIGH (ref 70–99)
Glucose-Capillary: 255 mg/dL — ABNORMAL HIGH (ref 70–99)

## 2022-09-02 LAB — CBC
HCT: 35.1 % — ABNORMAL LOW (ref 36.0–46.0)
Hemoglobin: 11 g/dL — ABNORMAL LOW (ref 12.0–15.0)
MCH: 28.5 pg (ref 26.0–34.0)
MCHC: 31.3 g/dL (ref 30.0–36.0)
MCV: 90.9 fL (ref 80.0–100.0)
Platelets: 167 10*3/uL (ref 150–400)
RBC: 3.86 MIL/uL — ABNORMAL LOW (ref 3.87–5.11)
RDW: 15.4 % (ref 11.5–15.5)
WBC: 8.2 10*3/uL (ref 4.0–10.5)
nRBC: 0 % (ref 0.0–0.2)

## 2022-09-02 LAB — PHOSPHORUS: Phosphorus: 3.5 mg/dL (ref 2.5–4.6)

## 2022-09-02 LAB — MAGNESIUM: Magnesium: 2.1 mg/dL (ref 1.7–2.4)

## 2022-09-02 MED ORDER — ACETAMINOPHEN 325 MG PO TABS
650.0000 mg | ORAL_TABLET | Freq: Four times a day (QID) | ORAL | 0 refills | Status: DC | PRN
  Filled 2022-09-02: qty 60, 8d supply, fill #0

## 2022-09-02 MED ORDER — ACETAMINOPHEN 325 MG PO TABS
325.0000 mg | ORAL_TABLET | Freq: Four times a day (QID) | ORAL | 0 refills | Status: DC | PRN
  Filled 2022-09-02: qty 60, 15d supply, fill #0

## 2022-09-02 MED ORDER — OXYCODONE-ACETAMINOPHEN 10-325 MG PO TABS: 1.0000 | ORAL_TABLET | Freq: Four times a day (QID) | ORAL | 0 refills | Status: DC | PRN

## 2022-09-02 MED ORDER — INSULIN PEN NEEDLE 32G X 4 MM MISC
1.0000 | Freq: Every morning | 0 refills | Status: DC
  Filled 2022-09-02: qty 100, 90d supply, fill #0

## 2022-09-02 MED ORDER — TOUJEO SOLOSTAR 300 UNIT/ML ~~LOC~~ SOPN
24.0000 [IU] | PEN_INJECTOR | Freq: Every day | SUBCUTANEOUS | 0 refills | Status: DC
  Filled 2022-09-02: qty 3, 30d supply, fill #0
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
## 2022-09-02 NOTE — Plan of Care (Signed)
Problem: Education: Goal: Understanding of cardiac disease, CV risk reduction, and recovery process will improve Outcome: Adequate for Discharge Goal: Individualized Educational Video(s) Outcome: Adequate for Discharge   Problem: Activity: Goal: Ability to tolerate increased activity will improve Outcome: Adequate for Discharge   Problem: Cardiac: Goal: Ability to achieve and maintain adequate cardiovascular perfusion will improve Outcome: Adequate for Discharge   Problem: Health Behavior/Discharge Planning: Goal: Ability to safely manage health-related needs after discharge will improve Outcome: Adequate for Discharge   Problem: Education: Goal: Ability to describe self-care measures that may prevent or decrease complications (Diabetes Survival Skills Education) will improve Outcome: Adequate for Discharge Goal: Individualized Educational Video(s) Outcome: Adequate for Discharge   Problem: Cardiac: Goal: Ability to maintain an adequate cardiac output will improve Outcome: Adequate for Discharge   Problem: Health Behavior/Discharge Planning: Goal: Ability to identify and utilize available resources and services will improve Outcome: Adequate for Discharge Goal: Ability to manage health-related needs will improve Outcome: Adequate for Discharge   Problem: Fluid Volume: Goal: Ability to achieve a balanced intake and output will improve Outcome: Adequate for Discharge   Problem: Metabolic: Goal: Ability to maintain appropriate glucose levels will improve Outcome: Adequate for Discharge   Problem: Nutritional: Goal: Maintenance of adequate nutrition will improve Outcome: Adequate for Discharge Goal: Maintenance of adequate weight for body size and type will improve Outcome: Adequate for Discharge   Problem: Respiratory: Goal: Will regain and/or maintain adequate ventilation Outcome: Adequate for Discharge   Problem: Urinary Elimination: Goal: Ability to achieve and  maintain adequate renal perfusion and functioning will improve Outcome: Adequate for Discharge   Problem: Education: Goal: Ability to describe self-care measures that may prevent or decrease complications (Diabetes Survival Skills Education) will improve Outcome: Adequate for Discharge Goal: Individualized Educational Video(s) Outcome: Adequate for Discharge   Problem: Coping: Goal: Ability to adjust to condition or change in health will improve Outcome: Adequate for Discharge   Problem: Fluid Volume: Goal: Ability to maintain a balanced intake and output will improve Outcome: Adequate for Discharge   Problem: Health Behavior/Discharge Planning: Goal: Ability to identify and utilize available resources and services will improve Outcome: Adequate for Discharge Goal: Ability to manage health-related needs will improve Outcome: Adequate for Discharge   Problem: Metabolic: Goal: Ability to maintain appropriate glucose levels will improve Outcome: Adequate for Discharge   Problem: Nutritional: Goal: Maintenance of adequate nutrition will improve Outcome: Adequate for Discharge Goal: Progress toward achieving an optimal weight will improve Outcome: Adequate for Discharge   Problem: Skin Integrity: Goal: Risk for impaired skin integrity will decrease Outcome: Adequate for Discharge   Problem: Tissue Perfusion: Goal: Adequacy of tissue perfusion will improve Outcome: Adequate for Discharge   Problem: Education: Goal: Knowledge of General Education information will improve Description: Including pain rating scale, medication(s)/side effects and non-pharmacologic comfort measures Outcome: Adequate for Discharge   Problem: Health Behavior/Discharge Planning: Goal: Ability to manage health-related needs will improve Outcome: Adequate for Discharge   Problem: Clinical Measurements: Goal: Ability to maintain clinical measurements within normal limits will improve Outcome:  Adequate for Discharge Goal: Will remain free from infection Outcome: Adequate for Discharge Goal: Diagnostic test results will improve Outcome: Adequate for Discharge Goal: Respiratory complications will improve Outcome: Adequate for Discharge Goal: Cardiovascular complication will be avoided Outcome: Adequate for Discharge   Problem: Activity: Goal: Risk for activity intolerance will decrease Outcome: Adequate for Discharge   Problem: Nutrition: Goal: Adequate nutrition will be maintained Outcome: Adequate for Discharge   Problem: Coping: Goal:  Level of anxiety will decrease Outcome: Adequate for Discharge   Problem: Elimination: Goal: Will not experience complications related to bowel motility Outcome: Adequate for Discharge Goal: Will not experience complications related to urinary retention Outcome: Adequate for Discharge   Problem: Pain Managment: Goal: General experience of comfort will improve Outcome: Adequate for Discharge   Problem: Safety: Goal: Ability to remain free from injury will improve Outcome: Adequate for Discharge   Problem: Skin Integrity: Goal: Risk for impaired skin integrity will decrease Outcome: Adequate for Discharge

## 2022-09-02 NOTE — TOC Transition Note (Signed)
Transition of Care [facility]) - CM/SW Discharge Note   Patient Details  Name: [name] [name] MRN: [id number] Date of Birth: Dec 02, 1970  [facility]) CM/SW Contact:  Zenon Mayo, RN Phone Number: 09/02/2022, 3:14 PM   Clinical Narrative:    Patient is homeless, TOC to do meds, she is for dc today, she states she is trying to get her son to pick her up, she states she is not sure if she is going to his house or to the shelter.  She has the shelter list that was given to her on recent admit.  She is waiting to her from her son.     Final next level of care: Home/Self Care Barriers to Discharge: Homeless with medical needs   Patient Goals and CMS Choice   Choice offered to / list presented to : NA  Discharge Placement                         Discharge Plan and Services Additional resources added to the After Visit Summary for   In-house Referral: NA Discharge Planning Services: CM Consult            DME Arranged: N/A DME Agency: NA                  Social Determinants of Health (SDOH) Interventions SDOH Screenings   Food Insecurity: Food Insecurity Present (08/30/2022)  Housing: High Risk (08/30/2022)  Transportation Needs: Unmet Transportation Needs (08/30/2022)  Utilities: At Risk (08/30/2022)  Alcohol Screen: Low Risk  (12/18/2018)  Depression (PHQ2-9): Medium Risk (07/30/2020)  Financial Resource Strain: Low Risk  (05/16/2022)  Tobacco Use: High Risk (08/30/2022)     Readmission Risk Interventions    09/02/2022    2:41 PM 08/28/2022    3:52 PM 08/22/2022    3:19 PM  Readmission Risk Prevention Plan  Transportation Screening Complete Complete Complete  Medication Review Press photographer) Complete Complete Complete  PCP or Specialist appointment within 3-5 days of discharge Complete Complete Complete  HRI or Home Care Consult Complete Complete Complete  SW Recovery Care/Counseling Consult   Complete  Palliative Care Screening Not  Applicable Not Applicable Not [place] Not Applicable Not Applicable Not Applicable

## 2022-09-02 NOTE — Progress Notes (Signed)
OT Cancellation Note  Patient Details Name: [name] [name] MRN: [id number] DOB: [birth year]   Cancelled Treatment:    Reason Eval/Treat Not Completed: OT screened, no needs identified, will sign off Per PT, pt Independent with ADLs and mobility. No formal OT eval needed.   Layla Maw 09/02/2022, 2:19 PM

## 2022-09-02 NOTE — Progress Notes (Addendum)
Inpatient Diabetes Program Recommendations  AACE/ADA: New Consensus Statement on Inpatient Glycemic Control (2015)  Target Ranges:  Prepandial:   less than 140 mg/dL      Peak postprandial:   less than 180 mg/dL (1-2 hours)      Critically ill patients:  140 - 180 mg/dL   Lab Results  Component Value Date   GLUCAP 255 (H) 09/02/2022   HGBA1C 11.0 (H) 08/27/2022    Review of Glycemic Control  Latest Reference Range & Units 08/31/22 11:35 08/31/22 16:13 08/31/22 21:03 09/01/22 06:00 09/01/22 11:06 09/01/22 16:40 09/01/22 21:16 09/02/22 05:45 09/02/22 10:40  Glucose-Capillary 70 - 99 mg/dL 368 (H) 268 (H) 210 (H) 276 (H) 188 (H) 240 (H) 210 (H) 197 (H) 255 (H)   Diabetes history: DM  Outpatient Diabetes medications:  Lantus 20 units daily Current orders for Inpatient glycemic control:  Novolog 0-15 units tid with meals and HS Semglee 10 units bid  Inpatient Diabetes Program Recommendations:   Consider increasing Semglee to 24 units once a day. Thanks,  Adah Perl, RN, BC-ADM Inpatient Diabetes Coordinator Pager [phone]  [phone]

## 2022-09-02 NOTE — Evaluation (Signed)
Physical Therapy Evaluation Patient Details Name: [name] [name] MRN: [id number] DOB: Jul 14, 1970 Today's Date: 09/02/2022  History of Present Illness  52 y.o. female presents to [facility] hospital on 08/27/2022 with complaints of SOB, back pain, chest pressure and nausea. Pt recently admitted 3/12-3/16 with AKI and COPD exacerbation. Pt admitted for management of NSTEMI and hyperglycemia. PMH includes: nonischemic cardiomyopathy, severe MR, schizoaffective disorder, COPD/asthma, cigarette smoker, IIDM.  Clinical Impression  Pt presents with admitting diagnosis above. Pt was able to ambulate in hallway independently. Pt presents at or near baseline mobility. Pt has no further acute PT needs and will be signing off. Re consult PT if mobility status changes.        Recommendations for follow up therapy are one component of a multi-disciplinary discharge planning process, led by the attending physician.  Recommendations may be updated based on patient status, additional functional criteria and insurance authorization.  Follow Up Recommendations Can patient physically be transported by private vehicle: Yes     Assistance Recommended at Discharge PRN  Patient can return home with the following  Assist for transportation    Equipment Recommendations None recommended by PT  Recommendations for Other Services       Functional Status Assessment Patient has not had a recent decline in their functional status     Precautions / Restrictions Precautions Precautions: Fall Restrictions Weight Bearing Restrictions: No      Mobility  Bed Mobility Overal bed mobility: Independent                  Transfers Overall transfer level: Independent Equipment used: None                    Ambulation/Gait Ambulation/Gait assistance: Modified independent (Device/Increase time) Gait Distance (Feet): 600 Feet Assistive device: None Gait Pattern/deviations: Step-through pattern,  Antalgic Gait velocity: functional     General Gait Details: slowed step-through gait, reduced stride length  Stairs            Wheelchair Mobility    Modified Rankin (Stroke Patients Only)       Balance Overall balance assessment: Mild deficits observed, not formally tested                                           Pertinent Vitals/Pain Pain Assessment Pain Assessment: 0-10 Pain Score: 6  Pain Location: Mid back Pain Descriptors / Indicators: Aching Pain Intervention(s): Premedicated before session, Monitored during session    Home Living Family/patient expects to be discharged to:: Shelter/Homeless                   Additional Comments: pt reports she will return to shelter unless she can buy a car with her next paycheck. No change from previous admission.    Prior Function Prior Level of Function : Independent/Modified Independent             Mobility Comments: pt reports some difficulty tolerating ambulation due to chronic back pain ADLs Comments: reports indep     Hand Dominance   Dominant Hand: Right    Extremity/Trunk Assessment   Upper Extremity Assessment Upper Extremity Assessment: Overall WFL for tasks assessed    Lower Extremity Assessment Lower Extremity Assessment: Overall WFL for tasks assessed    Cervical / Trunk Assessment Cervical / Trunk Assessment: Kyphotic  Communication   Communication: No difficulties  Cognition  Arousal/Alertness: Awake/alert Behavior During Therapy: WFL for tasks assessed/performed Overall Cognitive Status: Within Functional Limits for tasks assessed                                          General Comments General comments (skin integrity, edema, etc.): VSS on RA    Exercises     Assessment/Plan    PT Assessment Patient does not need any further PT services  PT Problem List Decreased activity tolerance;Decreased balance;Decreased  mobility;Cardiopulmonary status limiting activity;Pain       PT Treatment Interventions Gait training;Therapeutic activities;Balance training;Neuromuscular re-education;Patient/family education;Stair training    PT Goals (Current goals can be found in the Care Plan section)       Frequency Other (Comment) (DCPT)     Co-evaluation               AM-PAC PT "6 Clicks" Mobility  Outcome Measure Help needed turning from your back to your side while in a flat bed without using bedrails?: None Help needed moving from lying on your back to sitting on the side of a flat bed without using bedrails?: None Help needed moving to and from a bed to a chair (including a wheelchair)?: None Help needed standing up from a chair using your arms (e.g., wheelchair or bedside chair)?: None Help needed to walk in hospital room?: A Little Help needed climbing 3-5 steps with a railing? : A Little 6 Click Score: 22    End of Session Equipment Utilized During Treatment: Gait belt Activity Tolerance: Patient tolerated treatment well Patient left: in bed;with call bell/phone within reach;with nursing/sitter in room Nurse Communication: Mobility status PT Visit Diagnosis: Other abnormalities of gait and mobility (R26.89);Pain    Time: [id number] PT Time Calculation (min) (ACUTE ONLY): 18 min   Charges:   PT Evaluation $PT Eval Low Complexity: 1 Low PT Treatments $Gait Training: 8-22 mins        Shelby Mattocks, PT, DPT Acute Rehab Services [id number]   Viann Shove 09/02/2022, 4:10 PM

## 2022-09-02 NOTE — TOC Initial Note (Signed)
Transition of Care [facility]) - Initial/Assessment Note    Patient Details  Name: [name] [name] MRN: [id number] Date of Birth: 03-13-1971  [facility]) CM/SW Contact:    Zenon Mayo, RN Phone Number: 09/02/2022, 3:08 PM  Clinical Narrative:                 Patient is homeless, TOC to do meds, she is for dc today, she states she is trying to get her son to pick her up, she states she is not sure if she is going to his house or to the shelter.  She has the shelter list that was given to her on recent admit.  She is waiting to her from her son.  Expected Discharge Plan: Homeless Shelter Barriers to Discharge: Homeless with medical needs   Patient Goals and CMS Choice Patient states their goals for this hospitalization and ongoing recovery are:: get better   Choice offered to / list presented to : NA      Expected Discharge Plan and Services In-house Referral: NA Discharge Planning Services: CM Consult   Living arrangements for the past 2 months: Homeless Shelter Expected Discharge Date: 09/02/22               DME Arranged: N/A DME Agency: NA                  Prior Living Arrangements/Services Living arrangements for the past 2 months: Homeless Shelter Lives with:: Other (Comment) Patient language and need for interpreter reviewed:: Yes Do you feel safe going back to the place where you live?: Yes      Need for Family Participation in Patient Care: Yes (Comment) Care giver support system in place?: Yes (comment)   Criminal Activity/Legal Involvement Pertinent to Current Situation/Hospitalization: No - Comment as needed  Activities of Daily Living Home Assistive Devices/Equipment: None ADL Screening (condition at time of admission) Patient's cognitive ability adequate to safely complete daily activities?: Yes Is the patient deaf or have difficulty hearing?: No Does the patient have difficulty seeing, even when wearing glasses/contacts?: No Does  the patient have difficulty concentrating, remembering, or making decisions?: No Patient able to express need for assistance with ADLs?: Yes Does the patient have difficulty dressing or bathing?: No Independently performs ADLs?: Yes (appropriate for developmental age) Communication: Independent Dressing (OT): Independent Grooming: Independent Feeding: Independent Bathing: Independent Toileting: Independent In/Out Bed: Independent Walks in Home: Independent Does the patient have difficulty walking or climbing stairs?: No Weakness of Legs: None Weakness of Arms/Hands: None  Permission Sought/Granted                  Emotional Assessment Appearance:: Appears stated age Attitude/Demeanor/Rapport: Engaged Affect (typically observed): Appropriate Orientation: : Oriented to Self, Oriented to  Time, Oriented to Situation, Oriented to Place Alcohol / Substance Use: Not Applicable Psych Involvement: No (comment)  Admission diagnosis:  Hyperglycemia [R73.9] Hyperglycemia due to diabetes mellitus ([place]) [E11.65] Patient Active Problem List   Diagnosis Date Noted   Hyperglycemia due to diabetes mellitus ([place]) 08/30/2022   Hyperthyroidism 08/29/2022   Hyperglycemia 08/29/2022   NSTEMI (non-ST elevated myocardial infarction) ([place]) 08/27/2022   AKI (acute kidney injury) ([place]) 08/21/2022   Superficial vein thrombosis 08/06/2022   Anxiety 08/05/2022   Heart failure ([place]) 08/04/2022   COPD (chronic obstructive pulmonary disease) ([place]) 08/04/2022   Acute kidney injury superimposed on chronic kidney disease ([place]) 08/04/2022   Acute on chronic systolic heart failure ([place]) 08/03/2022   Bronchitis 08/03/2022  CHF (congestive heart failure) ([place]) 08/03/2022   Acute on chronic systolic CHF (congestive heart failure) ([place]) 05/15/2022   Acute pyelonephritis 07/17/2020   Homelessness 06/12/2020   Bipolar 1 disorder ([place]) 12/18/2018   Suicide attempt ([place])    Overdose, intentional self-harm,  initial encounter ([place]) 12/13/2018   Bipolar I disorder, single manic episode, severe, with psychosis ([place]) 06/09/2018   Bipolar disorder ([place]) 06/09/2018   DKA, type 2 ([place]) 01/06/2018   Diabetic acidosis without coma ([place])    Adjustment disorder with depressed mood 01/04/2018   Oral thrush    Acute lower UTI    DKA, type 2, not at goal [facility]) 01/03/2018   Thrush 01/03/2018   DM (diabetes mellitus), type 2, uncontrolled 06/07/2017   Abscess of groin, right 06/07/2017   Abscess 06/07/2017   Bipolar disorder with moderate depression ([place]) 08/31/2015   Tobacco use disorder 08/28/2015   HTN (hypertension) 08/28/2015   Type 2 diabetes mellitus with hyperlipidemia ([place]) 08/28/2015   Hyponatremia 08/28/2015   T2DM (type 2 diabetes mellitus) ([place]) 10/26/2009   OBESITY 10/26/2009   Essential hypertension 10/26/2009   DISTURBANCE OF SKIN SENSATION 10/26/2009   Migraine headache 03/05/2007   PCP:  Patient, No Pcp Per Pharmacy:   [facility] [address] [address] Phone: [phone] Fax: [phone]  [facility] [place], [place] [address] Phone: [phone] Fax: [phone]  [place], [place] [address] Phone: [phone] Fax: [phone]  [facility] [place], [place] [address] [facility] [place] [address] Phone: [phone] Fax: [phone]     Social Determinants of Health (SDOH) Social History: [place]: Food Insecurity Present (08/30/2022)  Housing: High Risk (08/30/2022)  Transportation Needs: Unmet Transportation Needs (08/30/2022)  Utilities: At Risk (08/30/2022)  Alcohol Screen: Low Risk  (12/18/2018)  Depression (PHQ2-9): Medium Risk  (07/30/2020)  Financial Resource Strain: Low Risk  (05/16/2022)  Tobacco Use: High Risk (08/30/2022)   SDOH Interventions:     Readmission Risk Interventions    09/02/2022    2:41 PM 08/28/2022    3:52 PM 08/22/2022    3:19 PM  Readmission Risk Prevention Plan  Transportation Screening Complete Complete Complete  Medication Review Press photographer) Complete Complete Complete  PCP or Specialist appointment within 3-5 days of discharge Complete Complete Complete  HRI or Home Care Consult Complete Complete Complete  SW Recovery Care/Counseling Consult   Complete  Palliative Care Screening Not Applicable Not Applicable Not [place] Not Applicable Not Applicable Not Applicable

## 2022-09-02 NOTE — Progress Notes (Signed)
Patient given discharge instructions and verbalized understanding. PIV x1 removed, and patient dressed herself. Patient to go to [facility] at discharge to get medications, awaiting taxi voucher for transportation.

## 2022-09-02 NOTE — Discharge Summary (Signed)
[name] [name] [id number] DOB: 08-02-1970 DOA: 08/30/2022  PCP: Patient, No Pcp Per  Admit date: 08/30/2022 Discharge date: 09/02/2022  Time spent: 35 minutes  Recommendations for Outpatient Follow-up:  Close cardiology f/u  Establish with pcp    Discharge Diagnoses:  Principal Problem:   Hyperglycemia due to diabetes mellitus [facility])   Discharge Condition: stable  Diet recommendation: carb modified  Filed Weights   08/31/22 0011 09/01/22 0028 09/02/22 0404  Weight: 80.8 kg 83.2 kg 83.7 kg    History of present illness:  From admission h and p [name] [name] is a 52 y.o. female with medical history significant for uncontrolled type 2 diabetes, hypertension, CKD 3B, asthma, schizophrenia, HFrEF 2025%, recently admitted for NSTEMI discharged yesterday, returns today with severe hyperglycemia from her shelter.  Associated with nausea.  Also endorses chest tightness that has been present since prior to her admission for NSTEMI.  States she did not pick up her insulin from the pharmacy.   Hospital Course:  This is the patient's fourth admission this year. Recent admit for nstemi managed medically. She presents with severe hyperglycemia. Says plan at recent admission was to discharge with insulin but she says she never received it prior to discharge and so hasn't been taking. Here initial glucose in the 600s, no DKA. Treated with insulin and glucose improved to 100-200s. She will discharge to her son's home and we will provide her with a supply of insulin, and I have asked social work to provide her with a list of PCPs, and she will also need follow up with cardiology. Due to her homelessness and mental health challenges, and her severe chronic diseases, she will unfortunately remain at high risk for readmission.   Procedures: none   Consultations: none  Discharge Exam: Vitals:   09/02/22 0731 09/02/22 1039  BP: (!) 116/44 96/62  Pulse: 83 93  Resp: 17 16  Temp: 98.3 F (36.8  C) 98.2 F (36.8 C)  SpO2: 94% 94%    General: NAD Cardiovascular: RRR Respiratory: CTAB  Discharge Instructions   Discharge Instructions     Diet - low sodium heart healthy   Complete by: As directed    Increase activity slowly   Complete by: As directed       Allergies as of 09/02/2022       Reactions   Tramadol Nausea Only, Rash   Wellbutrin [bupropion] Other (See Comments)   rage   Nicotine Rash   Patient states she is allergic to the Nicotine patch and they cause her to break out in a rash.  She states she can smoke cigarettes.        Medication List     STOP taking these medications    atorvastatin 20 MG tablet Commonly known as: LIPITOR       TAKE these medications    acetaminophen 325 MG tablet Commonly known as: TYLENOL Take 1 tablet (325 mg total) by mouth every 6 (six) hours as needed for headache or mild pain. What changed:  how much to take when to take this   Aspirin Low Dose 81 MG tablet Generic drug: aspirin EC Take 1 tablet (81 mg total) by mouth daily.   blood glucose meter kit and supplies Dispense based on patient and insurance preference. Use up to four times daily as directed. (FOR ICD-10 E10.9, E11.9).   bumetanide 2 MG tablet Commonly known as: BUMEX Take 1 tablet (2 mg total) by mouth 2 (two) times daily.   Insulin  Pen Needle 32G X 4 MM Misc Inject 1 each into the skin in the morning.   isosorbide mononitrate 60 MG 24 hr tablet Commonly known as: IMDUR Take 1 tablet (60 mg total) by mouth daily.   Lantus SoloStar 100 UNIT/ML Solostar Pen Generic drug: insulin glargine Inject 24 Units into the skin at bedtime. Do not take if blood sugar is <100. What changed:  how much to take when to take this   methimazole 5 MG tablet Commonly known as: TAPAZOLE Take 1 tablet (5 mg total) by mouth daily for 21 days.   metoprolol succinate 25 MG 24 hr tablet Commonly known as: TOPROL-XL Take 1 tablet (25 mg total) by mouth  daily.   nitroGLYCERIN 0.4 MG SL tablet Commonly known as: NITROSTAT Place 1 tablet (0.4 mg total) under the tongue every 5 (five) minutes as needed for chest pain.   oxyCODONE-acetaminophen 10-325 MG tablet Commonly known as: PERCOCET Take 1 tablet by mouth every 6 (six) hours as needed for pain. What changed: when to take this   potassium chloride SA 20 MEQ tablet Commonly known as: KLOR-CON M Take 1 tablet (20 mEq total) by mouth daily. Take with bumetanide.   risperiDONE 2 MG tablet Commonly known as: RISPERDAL Take one-half tablet in the morning and one tablet in the evening.   rosuvastatin 20 MG tablet Commonly known as: CRESTOR Take 1 tablet (20 mg total) by mouth daily.       Allergies  Allergen Reactions   Tramadol Nausea Only and Rash   Wellbutrin [Bupropion] Other (See Comments)    rage   Nicotine Rash    Patient states she is allergic to the Nicotine patch and they cause her to break out in a rash.  She states she can smoke cigarettes.    Follow-up Information     Buford Dresser, MD Follow up.   Specialty: Cardiology Contact information: [place] [address] [phone]                  The results of significant diagnostics from this hospitalization (including imaging, microbiology, ancillary and laboratory) are listed below for reference.    Significant Diagnostic Studies: DG Chest 2 View  Result Date: 08/30/2022 CLINICAL DATA:  Short this of breath, pain radiating to LEFT arm, chest pressure, and mid back pain for couple days EXAM: CHEST - 2 VIEW COMPARISON:  08/26/2022 FINDINGS: Upper normal heart size. Mediastinal contours and pulmonary vascularity normal. Lungs well expanded and clear. No pulmonary infiltrate, pleural effusion, or pneumothorax. No acute osseous findings. IMPRESSION: No acute abnormalities. Electronically Signed   By: Lavonia Dana M.D.   On: 08/30/2022 13:23   ECHOCARDIOGRAM LIMITED  Result  Date: 08/27/2022    ECHOCARDIOGRAM LIMITED REPORT   Patient Name:   [name] [name] Date of Exam: 08/27/2022 Medical Rec #:  [id number]       Height:       66.0 in Accession #:    [id number]      Weight:       170.0 lb Date of Birth:  1970/09/21       BSA:          1.866 m Patient Age:    85 years        BP:           121/73 mmHg Patient Gender: F               HR:  103 bpm. Exam Location:  Inpatient Procedure: Limited Echo and Limited Color Doppler Indications:    NSTEMI I21.4  History:        Patient has prior history of Echocardiogram examinations, most                 recent 08/23/2022. CHF, Previous Myocardial Infarction, COPD;                 Risk Factors:Hypertension, Diabetes and Current Smoker.  Sonographer:    Ronny Flurry Referring Phys: [id number] [place]  1. Left ventricular ejection fraction, by estimation, is 20 to 25%. The left ventricle has severely decreased function. The left ventricle has no regional wall motion abnormalities. The left ventricular internal cavity size was moderately to severely dilated. Left ventricular diastolic function could not be evaluated.  2. Right ventricular systolic function is normal. The right ventricular size is normal.  3. The mitral valve is normal in structure. There is severe functional mitral valve regurgitation.  4. The aortic valve is normal in structure. Aortic valve regurgitation is not visualized. No aortic stenosis is present.  5. The inferior vena cava is normal in size with greater than 50% respiratory variability, suggesting right atrial pressure of 3 mmHg. FINDINGS  Left Ventricle: Left ventricular ejection fraction, by estimation, is 20 to 25%. The left ventricle has severely decreased function. The left ventricle has no regional wall motion abnormalities. The left ventricular internal cavity size was moderately to severely dilated. There is no left ventricular hypertrophy. Left ventricular diastolic function could not be  evaluated. Right Ventricle: The right ventricular size is normal. No increase in right ventricular wall thickness. Right ventricular systolic function is normal. Left Atrium: Left atrial size was normal in size. Right Atrium: Right atrial size was normal in size. Pericardium: There is no evidence of pericardial effusion. Mitral Valve: The mitral valve is normal in structure. Severe mitral valve regurgitation. No evidence of mitral valve stenosis. Tricuspid Valve: The tricuspid valve is normal in structure. Tricuspid valve regurgitation is not demonstrated. No evidence of tricuspid stenosis. Aortic Valve: The aortic valve is normal in structure. Aortic valve regurgitation is not visualized. No aortic stenosis is present. Pulmonic Valve: The pulmonic valve was normal in structure. Pulmonic valve regurgitation is not visualized. No evidence of pulmonic stenosis. Aorta: The aortic root is normal in size and structure. Venous: The inferior vena cava is normal in size with greater than 50% respiratory variability, suggesting right atrial pressure of 3 mmHg. IAS/Shunts: No atrial level shunt detected by color flow Doppler. LEFT VENTRICLE PLAX 2D LVIDd:         7.00 cm      Diastology LVIDs:         6.50 cm      LV e' medial:  10.30 cm/s LV PW:         1.00 cm      LV e' lateral: 11.80 cm/s LV IVS:        0.90 cm LVOT diam:     2.10 cm LVOT Area:     3.46 cm  LV Volumes (MOD) LV vol d, MOD A2C: 293.0 ml LV vol d, MOD A4C: 282.0 ml LV vol s, MOD A2C: 235.0 ml LV vol s, MOD A4C: 216.0 ml LV SV MOD A2C:     58.0 ml LV SV MOD A4C:     282.0 ml LV SV MOD BP:      64.5 ml RIGHT VENTRICLE  IVC RV S prime:     21.10 cm/s  IVC diam: 1.80 cm LEFT ATRIUM         Index LA diam:    5.30 cm 2.84 cm/m   AORTA Ao Root diam: 3.40 cm Ao Asc diam:  2.80 cm  SHUNTS Systemic Diam: 2.10 cm Aditya Sabharwal Electronically signed by Hebert Soho Signature Date/Time: 08/27/2022/4:54:00 PM    Final    DG Chest Port 1 View  Result  Date: 08/26/2022 CLINICAL DATA:  Chest pain and shortness of breath. EXAM: PORTABLE CHEST 1 VIEW COMPARISON:  August 20, 2022 FINDINGS: The cardiac silhouette is mildly enlarged and unchanged in size. Low lung volumes are seen, likely secondary to suboptimal patient inspiration. Mild atelectasis is noted within the right lung base. There is no evidence of a pleural effusion or pneumothorax. The visualized skeletal structures are unremarkable. IMPRESSION: Low lung volumes with mild right basilar atelectasis. Electronically Signed   By: Virgina Norfolk M.D.   On: 08/26/2022 22:52   ECHOCARDIOGRAM COMPLETE  Result Date: 08/23/2022    ECHOCARDIOGRAM REPORT   Patient Name:   [name] [name] Date of Exam: 08/23/2022 Medical Rec #:  [id number]       Height:       66.0 in Accession #:    [id number]      Weight:       177.0 lb Date of Birth:  1971/03/05       BSA:          1.899 m Patient Age:    84 years        BP:           116/61 mmHg Patient Gender: F               HR:           95 bpm. Exam Location:  Inpatient Procedure: 2D Echo, Intracardiac Opacification Agent, Color Doppler and Cardiac            Doppler Indications:    CHF  History:        Patient has prior history of Echocardiogram examinations, most                 recent 05/15/2022. CHF, COPD; Risk Factors:Hypertension, Diabetes                 and Current Smoker.  Sonographer:    Marella Chimes Referring Phys: [place]  1. Left ventricular ejection fraction, by estimation, is <20%. The left ventricle has severely decreased function. The left ventricle demonstrates global hypokinesis. The left ventricular internal cavity size was severely dilated. Left ventricular diastolic parameters are indeterminate.  2. Right ventricular systolic function is normal. The right ventricular size is normal. Tricuspid regurgitation signal is inadequate for assessing PA pressure.  3. Left atrial size was moderately dilated.  4. The mitral valve is normal  in structure. Moderate mitral valve regurgitation. No evidence of mitral stenosis.  5. The aortic valve is tricuspid. Aortic valve regurgitation is not visualized. No aortic stenosis is present.  6. The inferior vena cava is dilated in size with >50% respiratory variability, suggesting right atrial pressure of 8 mmHg. Comparison(s): No significant change from prior study. Conclusion(s)/Recommendation(s): No left ventricular mural or apical thrombus/thrombi. FINDINGS  Left Ventricle: Left ventricular ejection fraction, by estimation, is <20%. The left ventricle has severely decreased function. The left ventricle demonstrates global hypokinesis. Definity contrast agent was given IV to delineate the left ventricular  endocardial borders. The left ventricular internal cavity size was severely dilated. There is no left ventricular hypertrophy. Left ventricular diastolic parameters are indeterminate. Right Ventricle: The right ventricular size is normal. No increase in right ventricular wall thickness. Right ventricular systolic function is normal. Tricuspid regurgitation signal is inadequate for assessing PA pressure. Left Atrium: Left atrial size was moderately dilated. Right Atrium: Right atrial size was normal in size. Pericardium: There is no evidence of pericardial effusion. Mitral Valve: The mitral valve is normal in structure. Moderate mitral valve regurgitation. No evidence of mitral valve stenosis. Tricuspid Valve: The tricuspid valve is normal in structure. Tricuspid valve regurgitation is trivial. No evidence of tricuspid stenosis. Aortic Valve: The aortic valve is tricuspid. Aortic valve regurgitation is not visualized. No aortic stenosis is present. Aortic valve mean gradient measures 4.0 mmHg. Aortic valve peak gradient measures 11.0 mmHg. Aortic valve area, by VTI measures 1.38  cm. Pulmonic Valve: The pulmonic valve was not well visualized. Pulmonic valve regurgitation is not visualized. No evidence of  pulmonic stenosis. Aorta: The aortic root, ascending aorta, aortic arch and descending aorta are all structurally normal, with no evidence of dilitation or obstruction. Venous: The inferior vena cava is dilated in size with greater than 50% respiratory variability, suggesting right atrial pressure of 8 mmHg. IAS/Shunts: The atrial septum is grossly normal.  LEFT VENTRICLE PLAX 2D LVIDd:         6.90 cm   Diastology LVIDs:         6.60 cm   LV e' medial:    9.25 cm/s LV PW:         1.20 cm   LV E/e' medial:  10.4 LV IVS:        0.90 cm   LV e' lateral:   11.30 cm/s LVOT diam:     2.20 cm   LV E/e' lateral: 8.5 LV SV:         39 LV SV Index:   20 LVOT Area:     3.80 cm  RIGHT VENTRICLE RV S prime:     10.90 cm/s TAPSE (M-mode): 2.1 cm LEFT ATRIUM              Index        RIGHT ATRIUM           Index LA diam:        5.10 cm  2.69 cm/m   RA Area:     12.20 cm LA Vol (A2C):   106.0 ml 55.83 ml/m  RA Volume:   28.70 ml  15.12 ml/m LA Vol (A4C):   68.5 ml  36.08 ml/m LA Biplane Vol: 89.7 ml  47.24 ml/m  AORTIC VALVE AV Area (Vmax):    1.33 cm AV Area (Vmean):   1.56 cm AV Area (VTI):     1.38 cm AV Vmax:           166.00 cm/s AV Vmean:          93.100 cm/s AV VTI:            0.280 m AV Peak Grad:      11.0 mmHg AV Mean Grad:      4.0 mmHg LVOT Vmax:         58.10 cm/s LVOT Vmean:        38.200 cm/s LVOT VTI:          0.102 m LVOT/AV VTI ratio: 0.36  AORTA Ao Root diam: 3.30 cm Ao Asc diam:  3.20 cm MITRAL VALVE MV Area (PHT): 3.35 cm    SHUNTS MV Decel Time: 227 msec    Systemic VTI:  0.10 m MR Peak grad: 75.0 mmHg    Systemic Diam: 2.20 cm MR Vmax:      433.00 cm/s MV E velocity: 96.55 cm/s MV A velocity: 96.40 cm/s MV E/A ratio:  1.00 Buford Dresser MD Electronically signed by Buford Dresser MD Signature Date/Time: 08/23/2022/4:55:05 PM    Final    DG Chest 2 View  Result Date: 08/20/2022 CLINICAL DATA:  Patient notes upper chest tightness that has been radiating to her left arm. She notes  this started a few days ago, but has happened before due to her Hx CHF. She also notes SOB, dry cough, DM, asthma. EXAM: CHEST - 2 VIEW COMPARISON:  08/19/2022 FINDINGS: Lungs are clear. Mild cardiomegaly. No effusion. Visualized bones unremarkable. IMPRESSION: Mild cardiomegaly.  No acute findings. Electronically Signed   By: Lucrezia Europe M.D.   On: 08/20/2022 14:35   DG Chest Port 1 View  Result Date: 08/19/2022 CLINICAL DATA:  Shortness of breath. EXAM: PORTABLE CHEST 1 VIEW COMPARISON:  PA Lat 08/14/2022 FINDINGS: The heart is moderately enlarged. The mediastinum is normally outlined. There is mild central vascular prominence without findings of acute edema. The lungs are clear of infiltrates. There is no substantial pleural effusion. Mild chronic elevation right hemidiaphragm. Thoracic spondylosis. IMPRESSION: Moderate cardiomegaly with mild central vascular prominence. No findings of acute edema or airspace consolidation. Electronically Signed   By: Telford Nab M.D.   On: 08/19/2022 05:31   DG Knee Complete 4 Views Left  Result Date: 08/14/2022 CLINICAL DATA:  Recent fall with left knee pain, initial encounter EXAM: LEFT KNEE - COMPLETE 4+ VIEW COMPARISON:  None Available. FINDINGS: Tricompartmental degenerative changes are noted. No joint effusion is seen. No acute fracture or dislocation is seen. IMPRESSION: Degenerative change without acute abnormality. Electronically Signed   By: Inez Catalina M.D.   On: 08/14/2022 21:27   DG Chest 2 View  Result Date: 08/14/2022 CLINICAL DATA:  Shortness of breath, cough EXAM: CHEST - 2 VIEW COMPARISON:  08/03/2022 FINDINGS: The heart size and mediastinal contours are within normal limits. Both lungs are clear. The visualized skeletal structures are unremarkable. IMPRESSION: No active cardiopulmonary disease. Electronically Signed   By: Rolm Baptise M.D.   On: 08/14/2022 03:05    Microbiology: Recent Results (from the past 240 hour(s))  Resp panel by RT-PCR  (RSV, Flu A&B, Covid) Anterior Nasal Swab     Status: None   Collection Time: 08/26/22 10:22 PM   Specimen: Anterior Nasal Swab  Result Value Ref Range Status   SARS Coronavirus 2 by RT PCR NEGATIVE NEGATIVE Final   Influenza A by PCR NEGATIVE NEGATIVE Final   Influenza B by PCR NEGATIVE NEGATIVE Final    Comment: (NOTE) The Xpert Xpress SARS-CoV-2/FLU/RSV plus assay is intended as an aid in the diagnosis of influenza from Nasopharyngeal swab specimens and should not be used as a sole basis for treatment. Nasal washings and aspirates are unacceptable for Xpert Xpress SARS-CoV-2/FLU/RSV testing.  Fact Sheet for Patients: EntrepreneurPulse.com.au  Fact Sheet for Healthcare Providers: IncredibleEmployment.be  This test is not yet approved or cleared by the [place] FDA and has been authorized for detection and/or diagnosis of SARS-CoV-2 by FDA under an Emergency Use Authorization (EUA). This EUA will remain in effect (meaning this test can be used) for the duration of the COVID-19 declaration under Section 564(b)(1) of  the Act, 21 U.S.C. section 360bbb-3(b)(1), unless the authorization is terminated or revoked.     Resp Syncytial Virus by PCR NEGATIVE NEGATIVE Final    Comment: (NOTE) Fact Sheet for Patients: EntrepreneurPulse.com.au  Fact Sheet for Healthcare Providers: IncredibleEmployment.be  This test is not yet approved or cleared by the [place] FDA and has been authorized for detection and/or diagnosis of SARS-CoV-2 by FDA under an Emergency Use Authorization (EUA). This EUA will remain in effect (meaning this test can be used) for the duration of the COVID-19 declaration under Section 564(b)(1) of the Act, 21 U.S.C. section 360bbb-3(b)(1), unless the authorization is terminated or revoked.  Performed at [facility], [place] [address], [place], [place] [address]      Labs: Basic  Metabolic Panel: Recent Labs  Lab 08/27/22 0754 08/27/22 0813 08/28/22 0645 08/29/22 0029 08/30/22 1256 08/30/22 1416 08/30/22 1417 08/30/22 2153 08/31/22 0031 09/01/22 0955 09/02/22 0059  NA  --    < > 137 134* 130* 131* 132*  --  132* 134* 137  K  --    < > 4.1 4.4 4.4 4.3 4.3  --  4.0 4.3 4.5  CL  --   --  100 101 94*  --  94*  --  97* 104 107  CO2  --   --  28 28 23   --   --   --  28 24 25   GLUCOSE  --   --  227* 263* 605*  --  644*  --  371* 238* 223*  BUN  --   --  31* 27* 24*  --  26*  --  25* 30* 28*  CREATININE  --   --  1.21* 1.33* 1.46*  --  1.30* 1.32* 1.37* 1.11* 1.37*  CALCIUM  --   --  8.4* 8.2* 9.0  --   --   --  8.4* 8.1* 8.1*  MG 2.4  --  2.0 2.2  --   --   --   --  2.1  --  2.1  PHOS  --   --   --   --   --   --   --   --  5.2*  --  3.5   < > = values in this interval not displayed.   Liver Function Tests: Recent Labs  Lab 08/26/22 2225 08/28/22 0645 08/29/22 0029 08/30/22 1256 08/31/22 0031  AST 26 14* 12* 25 13*  ALT 27 20 21  34 25  ALKPHOS 84 71 78 123 92  BILITOT 1.1 0.5 0.5 0.8 0.5  PROT 6.2* 5.5* 5.3* 7.1 5.8*  ALBUMIN 3.8 3.2* 2.9* 4.1 3.3*   Recent Labs  Lab 08/26/22 2225  LIPASE 25   No results for input(s): "AMMONIA" in the last 168 hours. CBC: Recent Labs  Lab 08/26/22 2225 08/26/22 2257 08/28/22 0645 08/29/22 0029 08/30/22 1256 08/30/22 1416 08/30/22 1417 08/30/22 2153 08/31/22 0031 09/02/22 0059  WBC 7.0  --  6.6 6.7 7.6  --   --  7.8 7.3 8.2  NEUTROABS 5.8  --  4.2 4.1  --   --   --   --   --   --   HGB 11.3*   < > 11.1* 10.4* 12.9 13.3 13.3 12.1 11.2* 11.0*  HCT 34.1*   < > 35.7* 32.5* 40.2 39.0 39.0 36.7 35.7* 35.1*  MCV 87.2  --  90.6 89.3 88.9  --   --  86.6 89.3 90.9  PLT 190  --  164 165 196  --   --  190 174 167   < > = values in this interval not displayed.   Cardiac Enzymes: No results for input(s): "CKTOTAL", "CKMB", "CKMBINDEX", "TROPONINI" in the last 168 hours. BNP: BNP (last 3 results) Recent Labs     08/26/22 2225 08/27/22 0341 08/28/22 0645  BNP 1,426.0* 1,118.0* 882.2*    ProBNP (last 3 results) No results for input(s): "PROBNP" in the last 8760 hours.  CBG: Recent Labs  Lab 09/01/22 1106 09/01/22 1640 09/01/22 2116 09/02/22 0545 09/02/22 1040  GLUCAP 188* 240* 210* 197* 255*       Signed:  Desma Maxim MD.  [facility] 09/02/2022, 2:34 PM

## 2022-09-05 ENCOUNTER — Emergency Department (HOSPITAL_COMMUNITY)
Admission: EM | Admit: 2022-09-05 | Discharge: 2022-09-06 | Disposition: A | Discharge status: Home / Self Care | Payer: Medicaid Other | Attending: Emergency Medicine | Admitting: Emergency Medicine

## 2022-09-05 ENCOUNTER — Encounter (HOSPITAL_COMMUNITY): Payer: Self-pay

## 2022-09-05 DIAGNOSIS — R739 Hyperglycemia, unspecified: Secondary | ICD-10-CM | POA: Insufficient documentation

## 2022-09-05 DIAGNOSIS — R42 Dizziness and giddiness: Secondary | ICD-10-CM | POA: Diagnosis present

## 2022-09-05 DIAGNOSIS — Z7982 Long term (current) use of aspirin: Secondary | ICD-10-CM | POA: Insufficient documentation

## 2022-09-05 DIAGNOSIS — E860 Dehydration: Secondary | ICD-10-CM | POA: Diagnosis not present

## 2022-09-05 DIAGNOSIS — Z794 Long term (current) use of insulin: Secondary | ICD-10-CM | POA: Insufficient documentation

## 2022-09-05 NOTE — ED Triage Notes (Signed)
BIB EMS/ [facility], dizziness X4 days/ pt is homeless/ x1 duoneb given/ CBG-413/ 119/60/ 95HR/ 95%RA

## 2022-09-06 LAB — CBC WITH DIFFERENTIAL/PLATELET
Abs Immature Granulocytes: 0.02 10*3/uL (ref 0.00–0.07)
Basophils Absolute: 0.1 10*3/uL (ref 0.0–0.1)
Basophils Relative: 1 %
Eosinophils Absolute: 0.1 10*3/uL (ref 0.0–0.5)
Eosinophils Relative: 2 %
HCT: 38.5 % (ref 36.0–46.0)
Hemoglobin: 12.3 g/dL (ref 12.0–15.0)
Immature Granulocytes: 0 %
Lymphocytes Relative: 16 %
Lymphs Abs: 1.2 10*3/uL (ref 0.7–4.0)
MCH: 28.8 pg (ref 26.0–34.0)
MCHC: 31.9 g/dL (ref 30.0–36.0)
MCV: 90.2 fL (ref 80.0–100.0)
Monocytes Absolute: 0.6 10*3/uL (ref 0.1–1.0)
Monocytes Relative: 7 %
Neutro Abs: 5.6 10*3/uL (ref 1.7–7.7)
Neutrophils Relative %: 74 %
Platelets: 164 10*3/uL (ref 150–400)
RBC: 4.27 MIL/uL (ref 3.87–5.11)
RDW: 15.9 % — ABNORMAL HIGH (ref 11.5–15.5)
WBC: 7.6 10*3/uL (ref 4.0–10.5)
nRBC: 0 % (ref 0.0–0.2)

## 2022-09-06 LAB — I-STAT VENOUS BLOOD GAS, ED
Acid-Base Excess: 4 mmol/L — ABNORMAL HIGH (ref 0.0–2.0)
Bicarbonate: 27.2 mmol/L (ref 20.0–28.0)
Calcium, Ion: 0.97 mmol/L — ABNORMAL LOW (ref 1.15–1.40)
HCT: 37 % (ref 36.0–46.0)
Hemoglobin: 12.6 g/dL (ref 12.0–15.0)
O2 Saturation: 99 %
Potassium: 5.6 mmol/L — ABNORMAL HIGH (ref 3.5–5.1)
Sodium: 131 mmol/L — ABNORMAL LOW (ref 135–145)
TCO2: 28 mmol/L (ref 22–32)
pCO2, Ven: 35.7 mmHg — ABNORMAL LOW (ref 44–60)
pH, Ven: 7.489 — ABNORMAL HIGH (ref 7.25–7.43)
pO2, Ven: 126 mmHg — ABNORMAL HIGH (ref 32–45)

## 2022-09-06 LAB — URINALYSIS, ROUTINE W REFLEX MICROSCOPIC
Bacteria, UA: NONE SEEN
Bilirubin Urine: NEGATIVE
Glucose, UA: >=500 mg/dL — AB
Hgb urine dipstick: NEGATIVE
Ketones, ur: NEGATIVE mg/dL
Leukocytes,Ua: NEGATIVE
Nitrite: NEGATIVE
Protein, ur: NEGATIVE mg/dL
Specific Gravity, Urine: 1.026 (ref 1.005–1.030)
pH: 5 (ref 5.0–8.0)

## 2022-09-06 LAB — COMPREHENSIVE METABOLIC PANEL
ALT: 22 U/L (ref 0–44)
AST: 38 U/L (ref 15–41)
Albumin: 3.7 g/dL (ref 3.5–5.0)
Alkaline Phosphatase: 84 U/L (ref 38–126)
Anion gap: 12 (ref 5–15)
BUN: 23 mg/dL — ABNORMAL HIGH (ref 6–20)
CO2: 26 mmol/L (ref 22–32)
Calcium: 9.8 mg/dL (ref 8.9–10.3)
Chloride: 96 mmol/L — ABNORMAL LOW (ref 98–111)
Creatinine, Ser: 1.09 mg/dL — ABNORMAL HIGH (ref 0.44–1.00)
GFR, Estimated: >60 mL/min (ref >60)
Glucose, Bld: 307 mg/dL — ABNORMAL HIGH (ref 70–99)
Potassium: 5.1 mmol/L (ref 3.5–5.1)
Sodium: 134 mmol/L — ABNORMAL LOW (ref 135–145)
Total Bilirubin: 1.1 mg/dL (ref 0.3–1.2)
Total Protein: 6.4 g/dL — ABNORMAL LOW (ref 6.5–8.1)

## 2022-09-06 LAB — CBG MONITORING, ED: Glucose-Capillary: 198 mg/dL — ABNORMAL HIGH (ref 70–99)

## 2022-09-06 LAB — BETA-HYDROXYBUTYRIC ACID: Beta-Hydroxybutyric Acid: 0.06 mmol/L (ref 0.05–0.27)

## 2022-09-06 LAB — TROPONIN I (HIGH SENSITIVITY): Troponin I (High Sensitivity): 52 ng/L — ABNORMAL HIGH (ref <18)

## 2022-09-06 MED ORDER — LACTATED RINGERS IV BOLUS
1000.0000 mL | Freq: Once | INTRAVENOUS | Status: AC
  Administered 2022-09-06: 1000 mL via INTRAVENOUS

## 2022-09-06 MED ORDER — INSULIN ASPART 100 UNIT/ML IJ SOLN
10.0000 [IU] | Freq: Once | INTRAMUSCULAR | Status: AC
  Administered 2022-09-06: 10 [IU] via INTRAVENOUS

## 2022-09-06 NOTE — Discharge Instructions (Addendum)
Please make sure you are taking your medications appropriately.

## 2022-09-06 NOTE — ED Notes (Signed)
Pt initially on 2L [place]. Pt O2 sats dropped to 84%. Bumped patient's O2 up to 4L [place] and O2 sats are now at 98%.

## 2022-09-06 NOTE — ED Provider Notes (Signed)
[facility] Provider Note   CSN: [id number] Arrival date & time: 09/05/22  2330     History  Chief Complaint  Patient presents with   Shortness of Breath   Dizziness    [name] [name] is a 52 y.o. female.  52 year old female who presents ER today secondary to hyperglycemia, chest pain patient's comfortable at this time sleeping on my initial exam.  No numbness weakness or tingling anywhere.  Similar symptoms to what she has had previously.  States that she has been taking her medications as prescribed.   Shortness of Breath Dizziness Associated symptoms: shortness of breath        Home Medications Prior to Admission medications   Medication Sig Start Date End Date Taking? Authorizing Provider  acetaminophen (TYLENOL) 325 MG tablet Take 1 tablet (325 mg total) by mouth every 6 (six) hours as needed for headache or mild pain. 09/02/22   Wouk, Ailene Rud, MD  ASPIRIN LOW DOSE 81 MG tablet Take 1 tablet (81 mg total) by mouth daily. 08/23/22   Caren Griffins, MD  blood glucose meter kit and supplies Dispense based on patient and insurance preference. Use up to four times daily as directed. (FOR ICD-10 E10.9, E11.9). 01/06/18   Eugenie Filler, MD  bumetanide (BUMEX) 2 MG tablet Take 1 tablet (2 mg total) by mouth 2 (two) times daily. 08/29/22 09/28/22  Arrien, Jimmy Picket, MD  insulin glargine, 1 Unit Dial, (TOUJEO SOLOSTAR) 300 UNIT/ML Solostar Pen Inject 24 Units into the skin at bedtime. 09/02/22 10/02/22  Wouk, Ailene Rud, MD  Insulin Pen Needle 32G X 4 MM MISC Inject 1 each into the skin in the morning. 09/02/22   Wouk, Ailene Rud, MD  isosorbide mononitrate (IMDUR) 60 MG 24 hr tablet Take 1 tablet (60 mg total) by mouth daily. 08/23/22 09/22/22  Caren Griffins, MD  methimazole (TAPAZOLE) 5 MG tablet Take 1 tablet (5 mg total) by mouth daily for 21 days. 08/29/22 09/19/22  Arrien, Jimmy Picket, MD  metoprolol succinate  (TOPROL-XL) 25 MG 24 hr tablet Take 1 tablet (25 mg total) by mouth daily. 08/23/22 09/22/22  Caren Griffins, MD  nitroGLYCERIN (NITROSTAT) 0.4 MG SL tablet Place 1 tablet (0.4 mg total) under the tongue every 5 (five) minutes as needed for chest pain. 08/23/22   Caren Griffins, MD  oxyCODONE-acetaminophen (PERCOCET) 10-325 MG tablet Take 1 tablet by mouth every 6 (six) hours as needed for pain. 09/02/22   Wouk, Ailene Rud, MD  potassium chloride SA (KLOR-CON M) 20 MEQ tablet Take 1 tablet (20 mEq total) by mouth daily. Take with bumetanide. 08/29/22   Arrien, Jimmy Picket, MD  risperiDONE (RISPERDAL) 2 MG tablet Take one-half tablet in the morning and one tablet in the evening. 08/23/22   Caren Griffins, MD  rosuvastatin (CRESTOR) 20 MG tablet Take 1 tablet (20 mg total) by mouth daily. 08/30/22 09/29/22  Arrien, Jimmy Picket, MD  benztropine (COGENTIN) 1 MG tablet Take 1 tablet (1 mg total) by mouth 2 (two) times daily. 12/23/18 06/04/20  Johnn Hai, MD  insulin aspart (NOVOLOG) 100 UNIT/ML injection Inject 0-9 Units into the skin 3 (three) times daily with meals. Patient not taking: Reported on 06/03/2020 12/16/18 06/04/20  Antonieta Pert, MD      Allergies    Tramadol, Wellbutrin [bupropion], and Nicotine    Review of Systems   Review of Systems  Respiratory:  Positive for shortness of breath.  Neurological:  Positive for dizziness.    Physical Exam Updated Vital Signs BP (!) 88/54   Pulse 92   Temp 97.9 F (36.6 C) (Oral)   Resp 17   SpO2 96%  Physical Exam Vitals and nursing note reviewed.  Constitutional:      Appearance: She is well-developed.  HENT:     Head: Normocephalic and atraumatic.  Cardiovascular:     Rate and Rhythm: Regular rhythm. Tachycardia present.  Pulmonary:     Effort: No respiratory distress.     Breath sounds: No stridor. No decreased breath sounds.  Abdominal:     General: There is no distension.  Musculoskeletal:     Cervical back: Normal  range of motion.  Neurological:     Mental Status: She is alert.     ED Results / Procedures / Treatments   Labs (all labs ordered are listed, but only abnormal results are displayed) Labs Reviewed  CBC WITH DIFFERENTIAL/PLATELET - Abnormal; Notable for the following components:      Result Value   RDW 15.9 (*)    All other components within normal limits  URINALYSIS, ROUTINE W REFLEX MICROSCOPIC - Abnormal; Notable for the following components:   APPearance HAZY (*)    Glucose, UA >=500 (*)    All other components within normal limits  COMPREHENSIVE METABOLIC PANEL - Abnormal; Notable for the following components:   Sodium 134 (*)    Chloride 96 (*)    Glucose, Bld 307 (*)    BUN 23 (*)    Creatinine, Ser 1.09 (*)    Total Protein 6.4 (*)    All other components within normal limits  I-STAT VENOUS BLOOD GAS, ED - Abnormal; Notable for the following components:   pH, Ven 7.489 (*)    pCO2, Ven 35.7 (*)    pO2, Ven 126 (*)    Acid-Base Excess 4.0 (*)    Sodium 131 (*)    Potassium 5.6 (*)    Calcium, Ion 0.97 (*)    All other components within normal limits  CBG MONITORING, ED - Abnormal; Notable for the following components:   Glucose-Capillary 198 (*)    All other components within normal limits  TROPONIN I (HIGH SENSITIVITY) - Abnormal; Notable for the following components:   Troponin I (High Sensitivity) 52 (*)    All other components within normal limits  BETA-HYDROXYBUTYRIC ACID    EKG EKG Interpretation  Date/Time:  Friday September 06 2022 00:19:36 EDT Ventricular Rate:  95 PR Interval:  170 QRS Duration: 107 QT Interval:  386 QTC Calculation: 486 R Axis:   122 Text Interpretation: Sinus rhythm Biatrial enlargement S1,S2,S3 pattern Consider left ventricular hypertrophy Borderline T abnormalities, lateral leads ST elev, probable normal early repol pattern Borderline prolonged QT interval Confirmed by Merrily Pew [phone]) on 09/06/2022 12:53:37  AM  Radiology No results found.  Procedures Procedures    Medications Ordered in ED Medications  lactated ringers bolus 1,000 mL (0 mLs Intravenous Stopped 09/06/22 0401)  insulin aspart (novoLOG) injection 10 Units (10 Units Intravenous Given 09/06/22 0258)    ED Course/ Medical Decision Making/ A&P                             Medical Decision Making Amount and/or Complexity of Data Reviewed Labs: ordered.  Risk Prescription drug management.   Blood sugar improved. On baseline oxygen. Troponin slightly elevated but likely just improving from recent NSTEMI. Feels baseline.  No indication for further workup or admission at this time.   Final Clinical Impression(s) / ED Diagnoses Final diagnoses:  Dehydration  Hyperglycemia    Rx / DC Orders ED Discharge Orders     None         Cortney Beissel, Corene Cornea, MD 09/06/22 [phone]

## 2022-09-10 ENCOUNTER — Inpatient Hospital Stay (INDEPENDENT_AMBULATORY_CARE_PROVIDER_SITE_OTHER): Payer: Medicaid Other | Admitting: Primary Care

## 2022-09-10 ENCOUNTER — Ambulatory Visit (HOSPITAL_BASED_OUTPATIENT_CLINIC_OR_DEPARTMENT_OTHER): Payer: Medicaid Other | Admitting: Family

## 2022-09-10 ENCOUNTER — Telehealth (INDEPENDENT_AMBULATORY_CARE_PROVIDER_SITE_OTHER): Payer: Self-pay

## 2022-09-10 NOTE — Telephone Encounter (Signed)
Contacted pt because she missed her appt with provider this morning and was seeing if she would like to switch to virtual or reschedule pt didn't answer was unable to lvm

## 2022-09-15 NOTE — Progress Notes (Deleted)
Cardiology Office Note:    Date:  09/15/2022   ID:  [name] [name], DOB 04-Apr-1971, MRN [id number]  PCP:  Patient, No Pcp Per   [facility] Providers Cardiologist:  Jodelle Red, MD { Click to update primary MD,subspecialty MD or APP then REFRESH:1}    Referring MD: Olene Floss, *   No chief complaint on file. ***  History of Present Illness:    [name] [name] is a 52 y.o. female with a hx of HFrEF, NICM, severe MR, DM, schizophrenia, bipolar disorder, migraines, homelessness.  Her medical care is confounded by her homelessness, and inability to consistently obtain/afford/adhere to medical therapies.  Hospice had been suggested in the past however this route was not ultimately selected as it would require her to stay with her daughter and her daughter did not want nurses in her home.  She has had multiple admissions/ED visits (15 since January 2024).  She was admitted on 08/26/2022 for evaluation of shortness of breath/NSTEMI.  She was noted to have significantly elevated troponin (6812>7517) however she denied chest pain, LHC in 2022 with normal coronary arteries.  Since she had no chest pain the decision was made for conservative treatment for ACS with IV heparin, statin management, blood pressure control.  She had a limited echocardiogram that showed an EF of 20 to 25%, no RWMA, severe functional MVR.  Past Medical History:  Diagnosis Date   Anxiety disorder    Asthma    Bipolar affective disorder (HCC)    Blood transfusion without reported diagnosis    CHF (congestive heart failure) (HCC)    Depression    Diabetes mellitus    Migraine    Schizophrenia (HCC)     Past Surgical History:  Procedure Laterality Date   KNEE ARTHROSCOPY     x 2   KNEE SURGERY Left    Incision made and knee cleaned out   TUBAL LIGATION      Current Medications: No outpatient medications have been marked as taking for the 09/16/22 encounter (Appointment) with  Alver Sorrow, NP.     Allergies:   Tramadol, Wellbutrin [bupropion], and Nicotine   Social History   Socioeconomic History   Marital status: Divorced    Spouse name: Not on file   Number of children: Not on file   Years of education: Not on file   Highest education level: Not on file  Occupational History   Not on file  Tobacco Use   Smoking status: Every Day    Packs/day: 1.00    Years: 29.00    Additional pack years: 0.00    Total pack years: 29.00    Types: Cigarettes   Smokeless tobacco: Never  Vaping Use   Vaping Use: Former  Substance and Sexual Activity   Alcohol use: Not Currently    Comment: occasionally   Drug use: No   Sexual activity: Yes    Birth control/protection: Surgical  Other Topics Concern   Not on file  Social History Narrative   Patient lives in [place] with her 2 kids- 74 in 47 years old.   She has total 4 kids.   She is a single mom.         Social Determinants of Health   Financial Resource Strain: Low Risk  (05/16/2022)   Overall Financial Resource Strain (CARDIA)    Difficulty of Paying Living Expenses: Not very hard  Food Insecurity: Food Insecurity Present (08/30/2022)   Hunger Vital Sign  Worried About Programme researcher, broadcasting/film/video in the Last Year: Often true    Barista in the Last Year: Often true  Transportation Needs: Unmet Transportation Needs (08/30/2022)   PRAPARE - Administrator, Civil Service (Medical): Yes    Lack of Transportation (Non-Medical): Yes  Physical Activity: Not on file  Stress: Not on file  Social Connections: Not on file     Family History: The patient's ***family history includes Bipolar disorder in her mother; Diabetes in her father; Hypertension in her father.  ROS:   Please see the history of present illness.    *** All other systems reviewed and are negative.  EKGs/Labs/Other Studies Reviewed:    The following studies were reviewed today: ***  EKG:  EKG is *** ordered  today.  The ekg ordered today demonstrates ***  Recent Labs: 08/27/2022: TSH 0.259 08/28/2022: B Natriuretic Peptide 882.2 09/02/2022: Magnesium 2.1 09/06/2022: ALT 22; BUN 23; Creatinine, Ser 1.09; Hemoglobin 12.6; Platelets 164; Potassium 5.6; Sodium 131  Recent Lipid Panel    Component Value Date/Time   CHOL 172 08/27/2022 0341   TRIG 63 08/27/2022 0341   HDL 80 08/27/2022 0341   CHOLHDL 2.2 08/27/2022 0341   VLDL 13 08/27/2022 0341   LDLCALC 79 08/27/2022 0341     Risk Assessment/Calculations:   {Does this patient have ATRIAL FIBRILLATION?[phone]}  No BP recorded.  {Refresh Note OR Click here to enter BP  :1}***         Physical Exam:    VS:  There were no vitals taken for this visit.    Wt Readings from Last 3 Encounters:  09/02/22 184 lb 8.4 oz (83.7 kg)  08/29/22 184 lb 4.8 oz (83.6 kg)  08/21/22 177 lb 0.5 oz (80.3 kg)     GEN: *** Well nourished, well developed in no acute distress HEENT: Normal NECK: No JVD; No carotid bruits LYMPHATICS: No lymphadenopathy CARDIAC: ***RRR, no murmurs, rubs, gallops RESPIRATORY:  Clear to auscultation without rales, wheezing or rhonchi  ABDOMEN: Soft, non-tender, non-distended MUSCULOSKELETAL:  No edema; No deformity  SKIN: Warm and dry NEUROLOGIC:  Alert and oriented x 3 PSYCHIATRIC:  Normal affect   ASSESSMENT:    No diagnosis found. PLAN:    In order of problems listed above:  ***      {Are you ordering a CV Procedure (e.g. stress test, cath, DCCV, TEE, etc)?   Press F2        :[id number]}    Medication Adjustments/Labs and Tests Ordered: Current medicines are reviewed at length with the patient today.  Concerns regarding medicines are outlined above.  No orders of the defined types were placed in this encounter.  No orders of the defined types were placed in this encounter.   There are no Patient Instructions on file for this visit.   Signed, Flossie Dibble, NP  09/15/2022 9:17 PM      [facility]

## 2022-09-16 ENCOUNTER — Emergency Department (HOSPITAL_COMMUNITY): Payer: BLUE CROSS/BLUE SHIELD

## 2022-09-16 ENCOUNTER — Other Ambulatory Visit: Payer: Self-pay

## 2022-09-16 ENCOUNTER — Encounter (HOSPITAL_COMMUNITY): Payer: Self-pay | Admitting: Emergency Medicine

## 2022-09-16 ENCOUNTER — Ambulatory Visit (HOSPITAL_BASED_OUTPATIENT_CLINIC_OR_DEPARTMENT_OTHER): Payer: Medicaid Other | Admitting: Cardiology

## 2022-09-16 ENCOUNTER — Inpatient Hospital Stay (HOSPITAL_COMMUNITY)
Admission: EM | Admit: 2022-09-16 | Discharge: 2022-09-30 | DRG: 208 | Disposition: A | Discharge status: Home / Self Care | Payer: BLUE CROSS/BLUE SHIELD | Attending: Internal Medicine | Admitting: Internal Medicine

## 2022-09-16 DIAGNOSIS — Z5901 Sheltered homelessness: Secondary | ICD-10-CM

## 2022-09-16 DIAGNOSIS — I130 Hypertensive heart and chronic kidney disease with heart failure and stage 1 through stage 4 chronic kidney disease, or unspecified chronic kidney disease: Secondary | ICD-10-CM | POA: Diagnosis present

## 2022-09-16 DIAGNOSIS — L89321 Pressure ulcer of left buttock, stage 1: Secondary | ICD-10-CM | POA: Diagnosis present

## 2022-09-16 DIAGNOSIS — J95811 Postprocedural pneumothorax: Secondary | ICD-10-CM | POA: Diagnosis not present

## 2022-09-16 DIAGNOSIS — L89311 Pressure ulcer of right buttock, stage 1: Secondary | ICD-10-CM | POA: Diagnosis present

## 2022-09-16 DIAGNOSIS — G894 Chronic pain syndrome: Secondary | ICD-10-CM | POA: Diagnosis present

## 2022-09-16 DIAGNOSIS — J9601 Acute respiratory failure with hypoxia: Secondary | ICD-10-CM | POA: Diagnosis not present

## 2022-09-16 DIAGNOSIS — E0590 Thyrotoxicosis, unspecified without thyrotoxic crisis or storm: Secondary | ICD-10-CM | POA: Diagnosis present

## 2022-09-16 DIAGNOSIS — I10 Essential (primary) hypertension: Secondary | ICD-10-CM

## 2022-09-16 DIAGNOSIS — Y848 Other medical procedures as the cause of abnormal reaction of the patient, or of later complication, without mention of misadventure at the time of the procedure: Secondary | ICD-10-CM | POA: Diagnosis not present

## 2022-09-16 DIAGNOSIS — Z6830 Body mass index (BMI) 30.0-30.9, adult: Secondary | ICD-10-CM

## 2022-09-16 DIAGNOSIS — G9341 Metabolic encephalopathy: Secondary | ICD-10-CM | POA: Diagnosis not present

## 2022-09-16 DIAGNOSIS — Z7982 Long term (current) use of aspirin: Secondary | ICD-10-CM

## 2022-09-16 DIAGNOSIS — F3130 Bipolar disorder, current episode depressed, mild or moderate severity, unspecified: Secondary | ICD-10-CM | POA: Diagnosis present

## 2022-09-16 DIAGNOSIS — I2489 Other forms of acute ischemic heart disease: Secondary | ICD-10-CM | POA: Diagnosis present

## 2022-09-16 DIAGNOSIS — I340 Nonrheumatic mitral (valve) insufficiency: Secondary | ICD-10-CM | POA: Diagnosis present

## 2022-09-16 DIAGNOSIS — Z794 Long term (current) use of insulin: Secondary | ICD-10-CM

## 2022-09-16 DIAGNOSIS — L8990 Pressure ulcer of unspecified site, unspecified stage: Secondary | ICD-10-CM

## 2022-09-16 DIAGNOSIS — Z1152 Encounter for screening for COVID-19: Secondary | ICD-10-CM

## 2022-09-16 DIAGNOSIS — N189 Chronic kidney disease, unspecified: Secondary | ICD-10-CM | POA: Diagnosis present

## 2022-09-16 DIAGNOSIS — J441 Chronic obstructive pulmonary disease with (acute) exacerbation: Principal | ICD-10-CM | POA: Diagnosis present

## 2022-09-16 DIAGNOSIS — Z818 Family history of other mental and behavioral disorders: Secondary | ICD-10-CM

## 2022-09-16 DIAGNOSIS — N1831 Chronic kidney disease, stage 3a: Secondary | ICD-10-CM | POA: Diagnosis present

## 2022-09-16 DIAGNOSIS — E1122 Type 2 diabetes mellitus with diabetic chronic kidney disease: Secondary | ICD-10-CM | POA: Diagnosis present

## 2022-09-16 DIAGNOSIS — J9602 Acute respiratory failure with hypercapnia: Secondary | ICD-10-CM | POA: Diagnosis not present

## 2022-09-16 DIAGNOSIS — I444 Left anterior fascicular block: Secondary | ICD-10-CM | POA: Diagnosis present

## 2022-09-16 DIAGNOSIS — F419 Anxiety disorder, unspecified: Secondary | ICD-10-CM | POA: Diagnosis present

## 2022-09-16 DIAGNOSIS — I252 Old myocardial infarction: Secondary | ICD-10-CM

## 2022-09-16 DIAGNOSIS — E039 Hypothyroidism, unspecified: Secondary | ICD-10-CM | POA: Diagnosis present

## 2022-09-16 DIAGNOSIS — E785 Hyperlipidemia, unspecified: Secondary | ICD-10-CM | POA: Diagnosis present

## 2022-09-16 DIAGNOSIS — I5043 Acute on chronic combined systolic (congestive) and diastolic (congestive) heart failure: Secondary | ICD-10-CM | POA: Diagnosis present

## 2022-09-16 DIAGNOSIS — F209 Schizophrenia, unspecified: Secondary | ICD-10-CM | POA: Diagnosis present

## 2022-09-16 DIAGNOSIS — Z885 Allergy status to narcotic agent status: Secondary | ICD-10-CM

## 2022-09-16 DIAGNOSIS — E669 Obesity, unspecified: Secondary | ICD-10-CM | POA: Diagnosis present

## 2022-09-16 DIAGNOSIS — Z79899 Other long term (current) drug therapy: Secondary | ICD-10-CM

## 2022-09-16 DIAGNOSIS — E1169 Type 2 diabetes mellitus with other specified complication: Secondary | ICD-10-CM | POA: Diagnosis present

## 2022-09-16 DIAGNOSIS — F319 Bipolar disorder, unspecified: Secondary | ICD-10-CM | POA: Diagnosis present

## 2022-09-16 DIAGNOSIS — F3181 Bipolar II disorder: Secondary | ICD-10-CM | POA: Diagnosis present

## 2022-09-16 DIAGNOSIS — Z5900 Homelessness unspecified: Secondary | ICD-10-CM

## 2022-09-16 DIAGNOSIS — E1165 Type 2 diabetes mellitus with hyperglycemia: Secondary | ICD-10-CM | POA: Diagnosis present

## 2022-09-16 DIAGNOSIS — B9789 Other viral agents as the cause of diseases classified elsewhere: Secondary | ICD-10-CM | POA: Diagnosis present

## 2022-09-16 DIAGNOSIS — I5023 Acute on chronic systolic (congestive) heart failure: Secondary | ICD-10-CM | POA: Diagnosis present

## 2022-09-16 DIAGNOSIS — Z833 Family history of diabetes mellitus: Secondary | ICD-10-CM

## 2022-09-16 DIAGNOSIS — I5020 Unspecified systolic (congestive) heart failure: Secondary | ICD-10-CM

## 2022-09-16 DIAGNOSIS — F17210 Nicotine dependence, cigarettes, uncomplicated: Secondary | ICD-10-CM | POA: Diagnosis present

## 2022-09-16 DIAGNOSIS — I428 Other cardiomyopathies: Secondary | ICD-10-CM | POA: Diagnosis present

## 2022-09-16 DIAGNOSIS — I959 Hypotension, unspecified: Secondary | ICD-10-CM | POA: Diagnosis not present

## 2022-09-16 DIAGNOSIS — Z8249 Family history of ischemic heart disease and other diseases of the circulatory system: Secondary | ICD-10-CM

## 2022-09-16 DIAGNOSIS — N179 Acute kidney failure, unspecified: Secondary | ICD-10-CM | POA: Diagnosis present

## 2022-09-16 DIAGNOSIS — I509 Heart failure, unspecified: Secondary | ICD-10-CM

## 2022-09-16 DIAGNOSIS — I447 Left bundle-branch block, unspecified: Secondary | ICD-10-CM | POA: Diagnosis present

## 2022-09-16 DIAGNOSIS — Z888 Allergy status to other drugs, medicaments and biological substances status: Secondary | ICD-10-CM

## 2022-09-16 HISTORY — DX: Chronic obstructive pulmonary disease, unspecified: J44.9

## 2022-09-16 LAB — CBC WITH DIFFERENTIAL/PLATELET
Abs Immature Granulocytes: 0.02 10*3/uL (ref 0.00–0.07)
Basophils Absolute: 0 10*3/uL (ref 0.0–0.1)
Basophils Relative: 1 %
Eosinophils Absolute: 0.2 10*3/uL (ref 0.0–0.5)
Eosinophils Relative: 4 %
HCT: 38 % (ref 36.0–46.0)
Hemoglobin: 12.2 g/dL (ref 12.0–15.0)
Immature Granulocytes: 0 %
Lymphocytes Relative: 28 %
Lymphs Abs: 1.7 10*3/uL (ref 0.7–4.0)
MCH: 28.6 pg (ref 26.0–34.0)
MCHC: 32.1 g/dL (ref 30.0–36.0)
MCV: 89 fL (ref 80.0–100.0)
Monocytes Absolute: 0.4 10*3/uL (ref 0.1–1.0)
Monocytes Relative: 7 %
Neutro Abs: 3.6 10*3/uL (ref 1.7–7.7)
Neutrophils Relative %: 60 %
Platelets: 195 10*3/uL (ref 150–400)
RBC: 4.27 MIL/uL (ref 3.87–5.11)
RDW: 15.1 % (ref 11.5–15.5)
WBC: 6 10*3/uL (ref 4.0–10.5)
nRBC: 0 % (ref 0.0–0.2)

## 2022-09-16 LAB — COMPREHENSIVE METABOLIC PANEL
ALT: 17 U/L (ref 0–44)
AST: 19 U/L (ref 15–41)
Albumin: 3.5 g/dL (ref 3.5–5.0)
Alkaline Phosphatase: 90 U/L (ref 38–126)
Anion gap: 13 (ref 5–15)
BUN: 20 mg/dL (ref 6–20)
CO2: 28 mmol/L (ref 22–32)
Calcium: 8.5 mg/dL — ABNORMAL LOW (ref 8.9–10.3)
Chloride: 98 mmol/L (ref 98–111)
Creatinine, Ser: 1.39 mg/dL — ABNORMAL HIGH (ref 0.44–1.00)
GFR, Estimated: 45 mL/min — ABNORMAL LOW (ref >60)
Glucose, Bld: 270 mg/dL — ABNORMAL HIGH (ref 70–99)
Potassium: 4.2 mmol/L (ref 3.5–5.1)
Sodium: 139 mmol/L (ref 135–145)
Total Bilirubin: 0.7 mg/dL (ref 0.3–1.2)
Total Protein: 6.2 g/dL — ABNORMAL LOW (ref 6.5–8.1)

## 2022-09-16 LAB — BRAIN NATRIURETIC PEPTIDE: B Natriuretic Peptide: 526.6 pg/mL — ABNORMAL HIGH (ref 0.0–100.0)

## 2022-09-16 LAB — TROPONIN I (HIGH SENSITIVITY)
Troponin I (High Sensitivity): 49 ng/L — ABNORMAL HIGH (ref <18)
Troponin I (High Sensitivity): 53 ng/L — ABNORMAL HIGH (ref <18)

## 2022-09-16 MED ORDER — ALBUTEROL SULFATE (2.5 MG/3ML) 0.083% IN NEBU
20.0000 mg/h | INHALATION_SOLUTION | Freq: Once | RESPIRATORY_TRACT | Status: AC
  Administered 2022-09-16: 20 mg/h via RESPIRATORY_TRACT
  Filled 2022-09-16: qty 20
  Filled 2022-09-16: qty 24

## 2022-09-16 MED ORDER — FUROSEMIDE 10 MG/ML IJ SOLN
80.0000 mg | Freq: Once | INTRAMUSCULAR | Status: AC
  Administered 2022-09-16: 80 mg via INTRAVENOUS
  Filled 2022-09-16: qty 8

## 2022-09-16 MED ORDER — LACTATED RINGERS IV BOLUS
500.0000 mL | Freq: Once | INTRAVENOUS | Status: AC
  Administered 2022-09-16: 500 mL via INTRAVENOUS

## 2022-09-16 NOTE — ED Triage Notes (Signed)
Pt BIBA from home after calling for SOB x2 days. Pt recently admitted for same. Pt has hx of CHF. Attempted to ambulate to stretcher but had to sit after about 53ft. 92% RA, increased to 98% on 8L w/ neb treatment. Received 2 duonebs, 1 atrovent, 125mg  solu-medrol en route, expiratory wheezes present. Patient in no obvious distress, requesting ice chips at this time.

## 2022-09-16 NOTE — ED Provider Notes (Signed)
  Provider Note MRN:  [id number]  Arrival date & time: 09/16/22    ED Course and Medical Decision Making  Assumed care from Dr. Jodi Mourning at shift change.  CHF versus COPD exacerbation anticipating admission.  Procedures  Final Clinical Impressions(s) / ED Diagnoses  No diagnosis found.  ED Discharge Orders     None       Discharge Instructions   None     Elmer Sow. Pilar Plate, MD [facility] Emergency Medicine [facility] [email or web address]    Sabas Sous, MD 09/17/22 [phone]

## 2022-09-16 NOTE — ED Notes (Signed)
Pt found with continuous neb treatment off face, attempted to educate pt on need to keep mask on. Pt states "I know what I need".

## 2022-09-16 NOTE — ED Notes (Signed)
Pt found to have removed self from monitor and walking around in the room. This RN attempted to educate pt on need to stay in bed and on monitoring equipment. Pt states she needs to call her son.

## 2022-09-16 NOTE — ED Provider Notes (Signed)
[place] EMERGENCY DEPARTMENT AT [facility] [place] Provider Note  Medical Decision Making   HPI: [name] [name] is a 52 y.o. female with history perinent 15-pack-year history of smoking, T2DM, HTN, CKD, wheezing, schizophrenia, HFrEF with EF of approximately 25%, recent NSTEMI who presents complaining of shortness of breath. Patient arrived via EMS.  History provided by patient and EMS.  No interpreter required for this encounter.  EMS reports that they were initially called out to scene for shortness of breath.  Reports that patient was initially saturating well on room air and breathing appropriately, however after attempting to ambulate to the ambulance she developed significant dyspnea, was placed on oxygen for comfort, administered 2 DuoNebs, Atrovent, and 125 mg Solu-Medrol en route.  Patient reports that she has had progressive shortness of breath and particularly dyspnea on exertion over the past 2 days.  Reports that she has been adherent to her home medication, denies chest pain, fevers, chills, nausea, vomiting, diarrhea.  ROS: As per HPI. Please see MAR for complete past medical history, surgical history, and social history.   Physical exam is pertinent for tachypnea, mild tachycardia, diffuse expiratory wheezing.   The differential includes but is not limited to pneumonia, pneumothorax, pleural effusion, asthma exacerbation, heart failure exacerbation, COPD exacerbation, ACS.  Additional history obtained from: EMS, chart review External records from outside source obtained and reviewed including: Reviewed patient's recent discharge summary from 3/25  ED provider interpretation of ECG: Rate 104, sinus rhythm, T wave inversions in leads aVL, V6, no ST elevations or depressions  ED provider interpretation of radiology/imaging: Chest x-ray with cardiomegaly, no focal airspace opacifications, pleural effusion, pneumothorax, bony displacement  Labs ordered were interpreted by  myself as well as my attending and were incorporated into the medical decision making process for this patient.  ED provider interpretation of labs: Show troponin 49, delta of 53, BNP elevated at 526.  CBC without leukocytosis, anemia, thrombocytopenia.  CMP with hyperglycemia, creatinine of 1.39, baseline appears to be 1.1-1.3.  Interventions: Continuous albuterol, LR bolus, Lasix, Zofran, home Percocet  See the EMR for full details regarding lab and imaging results.  Patient without respiratory distress, saturating well on room air upon arrival, though significant wheezing bilaterally, tachycardia likely secondary to albuterol.  Patient started on continuous albuterol and serial examinations performed.  Labs and imaging is indicated.  Imaging does not reveal any focal pneumonia, pleural effusion, pneumothorax, does reveal mild cardiomegaly consistent with known history of HFrEF.  Patient did experience mild decrease in blood pressure to systolic of 80s, responded appropriately to 500 cc LR bolus.  Further labs returned, patient with significant elevation of BNP consistent with HFrEF exacerbation, mild elevation of troponin, uptrending, consistent with demand ischemia in the setting of HFrEF exacerbation, 80mg  IV Lasix ordered given home diuretic of 2 mg Bumex.  On reevaluation, patient with persistent wheezing, however significant improvement objectively on auscultation as well as subjectively per patient, however patient complains of nausea, as well as chronic pain, requests home Percocet, ordered.  Overall, feel that patient is most appropriate for admission for management of both heart failure exacerbation as well as likely COPD exacerbation, hospitalist consulted for admission.  Consults: Hospitalists  Disposition: ADMIT: I believe the patient requires admission for further care and management. The patient was admitted to hospitalists. Please see inpatient provider note for additional treatment  plan details.   The plan for this patient was discussed with Dr. Jodi MourningZavitz, who voiced agreement and who oversaw evaluation and treatment of  this patient.  Clinical Impression:  1. COPD exacerbation   2. Acute on chronic congestive heart failure, unspecified heart failure type    Admit  Therapies: These medications and interventions were provided for the patient while in the ED. Medications  acetaminophen (TYLENOL) tablet 650 mg (has no administration in time range)    Or  acetaminophen (TYLENOL) suppository 650 mg (has no administration in time range)  melatonin tablet 3 mg (has no administration in time range)  doxycycline (VIBRAMYCIN) 100 mg in sodium chloride 0.9 % 250 mL IVPB (has no administration in time range)  methylPREDNISolone sodium succinate (SOLU-MEDROL) 125 mg/2 mL injection 80 mg (has no administration in time range)  levalbuterol (XOPENEX) nebulizer solution 1.25 mg (has no administration in time range)  ipratropium (ATROVENT) nebulizer solution 0.5 mg (has no administration in time range)  levalbuterol (XOPENEX) nebulizer solution 1.25 mg (has no administration in time range)  insulin glargine-yfgn (SEMGLEE) injection 12 Units (has no administration in time range)  insulin aspart (novoLOG) injection 0-9 Units (has no administration in time range)  insulin aspart (novoLOG) injection 0-5 Units (has no administration in time range)  aspirin EC tablet 81 mg (has no administration in time range)  bumetanide (BUMEX) tablet 2 mg (has no administration in time range)  methimazole (TAPAZOLE) tablet 5 mg (has no administration in time range)  metoprolol succinate (TOPROL-XL) 24 hr tablet 25 mg (has no administration in time range)  potassium chloride SA (KLOR-CON M) CR tablet 20 mEq (has no administration in time range)  rosuvastatin (CRESTOR) tablet 20 mg (has no administration in time range)  albuterol (PROVENTIL) (2.5 MG/3ML) 0.083% nebulizer solution (20 mg/hr Nebulization  Given 09/16/22 2142)  lactated ringers bolus 500 mL (0 mLs Intravenous Stopped 09/16/22 2226)  furosemide (LASIX) injection 80 mg (80 mg Intravenous Given 09/16/22 2238)  oxyCODONE-acetaminophen (PERCOCET/ROXICET) 5-325 MG per tablet 2 tablet (2 tablets Oral Given 09/17/22 0014)  ondansetron (ZOFRAN) injection 4 mg (4 mg Intravenous Given 09/17/22 0013)    MDM generated using voice dictation software and may contain dictation errors.  Please contact me for any clarification or with any questions.  Clinical Complexity A medically appropriate history, review of systems, and physical exam was performed.  Collateral history obtained from: EMS I personally reviewed the labs, EKG, imaging as discussed above. Patient's presentation is most consistent with acute complicated illness / injury requiring diagnostic workup Considered and ruled out life and body threatening conditions  Treatment: Hospitalization Medications: Prescription Discussed patient's care with providers from the following different specialties: Hospitalists   Physical Exam   ED Triage Vitals [09/16/22 2106]  Enc Vitals Group     BP      Pulse Rate (!) 102     Resp (!) 27     Temp      Temp src      SpO2 92 %     Weight      Height      Head Circumference      Peak Flow      Pain Score 8     Pain Loc      Pain Edu?      Excl. in GC?      Physical Exam Vitals and nursing note reviewed.  Constitutional:      General: She is not in acute distress.    Appearance: She is well-developed.  HENT:     Head: Normocephalic and atraumatic.  Eyes:     Conjunctiva/sclera: Conjunctivae normal.  Cardiovascular:  Rate and Rhythm: Regular rhythm. Tachycardia present.     Heart sounds: No murmur heard. Pulmonary:     Effort: Pulmonary effort is normal. Tachypnea present. No respiratory distress.     Breath sounds: Examination of the right-upper field reveals wheezing. Examination of the left-upper field reveals wheezing.  Examination of the right-middle field reveals wheezing. Examination of the left-middle field reveals wheezing. Examination of the right-lower field reveals wheezing. Examination of the left-lower field reveals wheezing. Wheezing present.  Abdominal:     Palpations: Abdomen is soft.     Tenderness: There is no abdominal tenderness.  Musculoskeletal:        General: No swelling.     Cervical back: Neck supple.     Right lower leg: No tenderness. No edema.     Left lower leg: No tenderness. No edema.  Skin:    General: Skin is warm and dry.     Capillary Refill: Capillary refill takes less than 2 seconds.  Neurological:     General: No focal deficit present.     Mental Status: She is alert.  Psychiatric:        Mood and Affect: Mood normal.       Procedure Note  Procedures  DG Chest Portable 1 View  Final Result      Julianne Rice, MD Emergency Medicine, PGY-2    Curley Spice, MD 09/17/22 9518    Blane Ohara, MD 09/18/22 [phone]

## 2022-09-17 ENCOUNTER — Encounter (HOSPITAL_COMMUNITY): Payer: Self-pay | Admitting: Internal Medicine

## 2022-09-17 DIAGNOSIS — Z1152 Encounter for screening for COVID-19: Secondary | ICD-10-CM | POA: Diagnosis not present

## 2022-09-17 DIAGNOSIS — F209 Schizophrenia, unspecified: Secondary | ICD-10-CM | POA: Diagnosis present

## 2022-09-17 DIAGNOSIS — I428 Other cardiomyopathies: Secondary | ICD-10-CM | POA: Diagnosis present

## 2022-09-17 DIAGNOSIS — F319 Bipolar disorder, unspecified: Secondary | ICD-10-CM | POA: Diagnosis not present

## 2022-09-17 DIAGNOSIS — I5022 Chronic systolic (congestive) heart failure: Secondary | ICD-10-CM

## 2022-09-17 DIAGNOSIS — E785 Hyperlipidemia, unspecified: Secondary | ICD-10-CM

## 2022-09-17 DIAGNOSIS — J441 Chronic obstructive pulmonary disease with (acute) exacerbation: Principal | ICD-10-CM

## 2022-09-17 DIAGNOSIS — E1165 Type 2 diabetes mellitus with hyperglycemia: Secondary | ICD-10-CM | POA: Diagnosis present

## 2022-09-17 DIAGNOSIS — N189 Chronic kidney disease, unspecified: Secondary | ICD-10-CM

## 2022-09-17 DIAGNOSIS — J9602 Acute respiratory failure with hypercapnia: Secondary | ICD-10-CM | POA: Diagnosis not present

## 2022-09-17 DIAGNOSIS — N1831 Chronic kidney disease, stage 3a: Secondary | ICD-10-CM

## 2022-09-17 DIAGNOSIS — E1169 Type 2 diabetes mellitus with other specified complication: Secondary | ICD-10-CM

## 2022-09-17 DIAGNOSIS — E0590 Thyrotoxicosis, unspecified without thyrotoxic crisis or storm: Secondary | ICD-10-CM | POA: Diagnosis present

## 2022-09-17 DIAGNOSIS — J95811 Postprocedural pneumothorax: Secondary | ICD-10-CM | POA: Diagnosis not present

## 2022-09-17 DIAGNOSIS — E669 Obesity, unspecified: Secondary | ICD-10-CM | POA: Diagnosis present

## 2022-09-17 DIAGNOSIS — I340 Nonrheumatic mitral (valve) insufficiency: Secondary | ICD-10-CM | POA: Diagnosis present

## 2022-09-17 DIAGNOSIS — J9601 Acute respiratory failure with hypoxia: Secondary | ICD-10-CM | POA: Diagnosis not present

## 2022-09-17 DIAGNOSIS — Z794 Long term (current) use of insulin: Secondary | ICD-10-CM | POA: Diagnosis not present

## 2022-09-17 DIAGNOSIS — F3181 Bipolar II disorder: Secondary | ICD-10-CM | POA: Diagnosis present

## 2022-09-17 DIAGNOSIS — R9431 Abnormal electrocardiogram [ECG] [EKG]: Secondary | ICD-10-CM

## 2022-09-17 DIAGNOSIS — E119 Type 2 diabetes mellitus without complications: Secondary | ICD-10-CM | POA: Diagnosis not present

## 2022-09-17 DIAGNOSIS — I5043 Acute on chronic combined systolic (congestive) and diastolic (congestive) heart failure: Secondary | ICD-10-CM | POA: Diagnosis present

## 2022-09-17 DIAGNOSIS — I5023 Acute on chronic systolic (congestive) heart failure: Secondary | ICD-10-CM | POA: Diagnosis not present

## 2022-09-17 DIAGNOSIS — L89311 Pressure ulcer of right buttock, stage 1: Secondary | ICD-10-CM | POA: Diagnosis present

## 2022-09-17 DIAGNOSIS — E1122 Type 2 diabetes mellitus with diabetic chronic kidney disease: Secondary | ICD-10-CM | POA: Diagnosis present

## 2022-09-17 DIAGNOSIS — N179 Acute kidney failure, unspecified: Secondary | ICD-10-CM

## 2022-09-17 DIAGNOSIS — G9341 Metabolic encephalopathy: Secondary | ICD-10-CM | POA: Diagnosis not present

## 2022-09-17 DIAGNOSIS — I130 Hypertensive heart and chronic kidney disease with heart failure and stage 1 through stage 4 chronic kidney disease, or unspecified chronic kidney disease: Secondary | ICD-10-CM | POA: Diagnosis present

## 2022-09-17 DIAGNOSIS — I10 Essential (primary) hypertension: Secondary | ICD-10-CM | POA: Diagnosis not present

## 2022-09-17 DIAGNOSIS — L89321 Pressure ulcer of left buttock, stage 1: Secondary | ICD-10-CM | POA: Diagnosis present

## 2022-09-17 DIAGNOSIS — Z5901 Sheltered homelessness: Secondary | ICD-10-CM | POA: Diagnosis not present

## 2022-09-17 DIAGNOSIS — E039 Hypothyroidism, unspecified: Secondary | ICD-10-CM | POA: Diagnosis present

## 2022-09-17 DIAGNOSIS — Y848 Other medical procedures as the cause of abnormal reaction of the patient, or of later complication, without mention of misadventure at the time of the procedure: Secondary | ICD-10-CM | POA: Diagnosis not present

## 2022-09-17 DIAGNOSIS — I2489 Other forms of acute ischemic heart disease: Secondary | ICD-10-CM | POA: Diagnosis present

## 2022-09-17 LAB — CBC WITH DIFFERENTIAL/PLATELET
Abs Immature Granulocytes: 0.03 10*3/uL (ref 0.00–0.07)
Basophils Absolute: 0 10*3/uL (ref 0.0–0.1)
Basophils Relative: 0 %
Eosinophils Absolute: 0 10*3/uL (ref 0.0–0.5)
Eosinophils Relative: 0 %
HCT: 36.7 % (ref 36.0–46.0)
Hemoglobin: 11.1 g/dL — ABNORMAL LOW (ref 12.0–15.0)
Immature Granulocytes: 0 %
Lymphocytes Relative: 6 %
Lymphs Abs: 0.4 10*3/uL — ABNORMAL LOW (ref 0.7–4.0)
MCH: 27.8 pg (ref 26.0–34.0)
MCHC: 30.2 g/dL (ref 30.0–36.0)
MCV: 91.8 fL (ref 80.0–100.0)
Monocytes Absolute: 0.1 10*3/uL (ref 0.1–1.0)
Monocytes Relative: 1 %
Neutro Abs: 6.5 10*3/uL (ref 1.7–7.7)
Neutrophils Relative %: 93 %
Platelets: 172 10*3/uL (ref 150–400)
RBC: 4 MIL/uL (ref 3.87–5.11)
RDW: 14.9 % (ref 11.5–15.5)
WBC: 7 10*3/uL (ref 4.0–10.5)
nRBC: 0 % (ref 0.0–0.2)

## 2022-09-17 LAB — COMPREHENSIVE METABOLIC PANEL
ALT: 16 U/L (ref 0–44)
AST: 21 U/L (ref 15–41)
Albumin: 3.3 g/dL — ABNORMAL LOW (ref 3.5–5.0)
Alkaline Phosphatase: 93 U/L (ref 38–126)
Anion gap: 14 (ref 5–15)
BUN: 20 mg/dL (ref 6–20)
CO2: 26 mmol/L (ref 22–32)
Calcium: 8.1 mg/dL — ABNORMAL LOW (ref 8.9–10.3)
Chloride: 94 mmol/L — ABNORMAL LOW (ref 98–111)
Creatinine, Ser: 1.47 mg/dL — ABNORMAL HIGH (ref 0.44–1.00)
GFR, Estimated: 42 mL/min — ABNORMAL LOW (ref >60)
Glucose, Bld: 506 mg/dL (ref 70–99)
Potassium: 3.9 mmol/L (ref 3.5–5.1)
Sodium: 134 mmol/L — ABNORMAL LOW (ref 135–145)
Total Bilirubin: 0.4 mg/dL (ref 0.3–1.2)
Total Protein: 5.9 g/dL — ABNORMAL LOW (ref 6.5–8.1)

## 2022-09-17 LAB — I-STAT VENOUS BLOOD GAS, ED
Acid-Base Excess: 3 mmol/L — ABNORMAL HIGH (ref 0.0–2.0)
Bicarbonate: 28.1 mmol/L — ABNORMAL HIGH (ref 20.0–28.0)
Calcium, Ion: 1.03 mmol/L — ABNORMAL LOW (ref 1.15–1.40)
HCT: 35 % — ABNORMAL LOW (ref 36.0–46.0)
Hemoglobin: 11.9 g/dL — ABNORMAL LOW (ref 12.0–15.0)
O2 Saturation: 97 %
Potassium: 3.9 mmol/L (ref 3.5–5.1)
Sodium: 134 mmol/L — ABNORMAL LOW (ref 135–145)
TCO2: 29 mmol/L (ref 22–32)
pCO2, Ven: 42.7 mmHg — ABNORMAL LOW (ref 44–60)
pH, Ven: 7.426 (ref 7.25–7.43)
pO2, Ven: 89 mmHg — ABNORMAL HIGH (ref 32–45)

## 2022-09-17 LAB — GLUCOSE, CAPILLARY
Glucose-Capillary: 391 mg/dL — ABNORMAL HIGH (ref 70–99)
Glucose-Capillary: 392 mg/dL — ABNORMAL HIGH (ref 70–99)
Glucose-Capillary: 153 mg/dL — ABNORMAL HIGH (ref 70–99)

## 2022-09-17 LAB — CBG MONITORING, ED
Glucose-Capillary: 366 mg/dL — ABNORMAL HIGH (ref 70–99)
Glucose-Capillary: 431 mg/dL — ABNORMAL HIGH (ref 70–99)
Glucose-Capillary: 435 mg/dL — ABNORMAL HIGH (ref 70–99)

## 2022-09-17 LAB — SARS CORONAVIRUS 2 BY RT PCR: SARS Coronavirus 2 by RT PCR: NEGATIVE

## 2022-09-17 LAB — PROCALCITONIN: Procalcitonin: <0.1 ng/mL

## 2022-09-17 LAB — MAGNESIUM
Magnesium: 1.7 mg/dL (ref 1.7–2.4)
Magnesium: 1.8 mg/dL (ref 1.7–2.4)

## 2022-09-17 LAB — PHOSPHORUS: Phosphorus: 4.1 mg/dL (ref 2.5–4.6)

## 2022-09-17 LAB — TROPONIN I (HIGH SENSITIVITY): Troponin I (High Sensitivity): 52 ng/L — ABNORMAL HIGH (ref <18)

## 2022-09-17 LAB — TSH: TSH: 0.236 u[IU]/mL — ABNORMAL LOW (ref 0.350–4.500)

## 2022-09-17 MED ORDER — RISPERIDONE 1 MG PO TABS
1.0000 mg | ORAL_TABLET | Freq: Every day | ORAL | Status: DC
  Filled 2022-09-17: qty 1

## 2022-09-17 MED ORDER — INSULIN ASPART 100 UNIT/ML IJ SOLN: 0.0000 [IU] | Freq: Three times a day (TID) | INTRAMUSCULAR | Status: DC

## 2022-09-17 MED ORDER — ISOSORBIDE MONONITRATE ER 60 MG PO TB24
60.0000 mg | ORAL_TABLET | Freq: Every day | ORAL | Status: DC
  Administered 2022-09-17: 60 mg via ORAL
  Filled 2022-09-17 (×2): qty 1

## 2022-09-17 MED ORDER — LEVALBUTEROL HCL 1.25 MG/0.5ML IN NEBU
1.2500 mg | INHALATION_SOLUTION | RESPIRATORY_TRACT | Status: DC | PRN
  Administered 2022-09-17: 1.25 mg via RESPIRATORY_TRACT
  Filled 2022-09-17: qty 0.5

## 2022-09-17 MED ORDER — SODIUM CHLORIDE 0.9 % IV SOLN
100.0000 mg | Freq: Two times a day (BID) | INTRAVENOUS | Status: DC
  Administered 2022-09-17 (×2): 100 mg via INTRAVENOUS
  Filled 2022-09-17 (×3): qty 100

## 2022-09-17 MED ORDER — ACETAMINOPHEN 650 MG RE SUPP: 650.0000 mg | Freq: Four times a day (QID) | RECTAL | Status: DC | PRN

## 2022-09-17 MED ORDER — METOPROLOL SUCCINATE ER 50 MG PO TB24
25.0000 mg | ORAL_TABLET | Freq: Every day | ORAL | Status: DC
  Administered 2022-09-17: 25 mg via ORAL
  Filled 2022-09-17: qty 1

## 2022-09-17 MED ORDER — NITROGLYCERIN 0.4 MG SL SUBL
0.4000 mg | SUBLINGUAL_TABLET | SUBLINGUAL | Status: DC | PRN
  Administered 2022-09-17 (×2): 0.4 mg via SUBLINGUAL
  Filled 2022-09-17: qty 1

## 2022-09-17 MED ORDER — OXYCODONE-ACETAMINOPHEN 5-325 MG PO TABS
2.0000 | ORAL_TABLET | Freq: Once | ORAL | Status: AC
  Administered 2022-09-17: 2 via ORAL
  Filled 2022-09-17: qty 2

## 2022-09-17 MED ORDER — FUROSEMIDE 10 MG/ML IJ SOLN
60.0000 mg | Freq: Two times a day (BID) | INTRAMUSCULAR | Status: DC
  Administered 2022-09-17: 60 mg via INTRAVENOUS
  Filled 2022-09-17: qty 6

## 2022-09-17 MED ORDER — ROSUVASTATIN CALCIUM 20 MG PO TABS
20.0000 mg | ORAL_TABLET | Freq: Every day | ORAL | Status: DC
  Administered 2022-09-17: 20 mg via ORAL
  Filled 2022-09-17: qty 1

## 2022-09-17 MED ORDER — METHIMAZOLE 5 MG PO TABS
5.0000 mg | ORAL_TABLET | Freq: Every day | ORAL | Status: DC
  Administered 2022-09-17: 5 mg via ORAL
  Filled 2022-09-17 (×2): qty 1

## 2022-09-17 MED ORDER — ALBUTEROL SULFATE (2.5 MG/3ML) 0.083% IN NEBU
10.0000 mg | INHALATION_SOLUTION | RESPIRATORY_TRACT | Status: AC
  Administered 2022-09-17: 10 mg via RESPIRATORY_TRACT
  Filled 2022-09-17: qty 12

## 2022-09-17 MED ORDER — INSULIN ASPART 100 UNIT/ML IJ SOLN
0.0000 [IU] | Freq: Three times a day (TID) | INTRAMUSCULAR | Status: DC
  Administered 2022-09-17 (×2): 15 [IU] via SUBCUTANEOUS

## 2022-09-17 MED ORDER — MAGNESIUM SULFATE 2 GM/50ML IV SOLN
2.0000 g | Freq: Once | INTRAVENOUS | Status: AC
  Administered 2022-09-17: 2 g via INTRAVENOUS
  Filled 2022-09-17: qty 50

## 2022-09-17 MED ORDER — ACETAMINOPHEN 325 MG PO TABS
650.0000 mg | ORAL_TABLET | Freq: Four times a day (QID) | ORAL | Status: DC | PRN
  Administered 2022-09-17: 650 mg via ORAL
  Filled 2022-09-17: qty 2

## 2022-09-17 MED ORDER — ASPIRIN 81 MG PO TBEC
81.0000 mg | DELAYED_RELEASE_TABLET | Freq: Every day | ORAL | Status: DC
  Administered 2022-09-17: 81 mg via ORAL
  Filled 2022-09-17: qty 1

## 2022-09-17 MED ORDER — IPRATROPIUM BROMIDE 0.02 % IN SOLN
0.5000 mg | Freq: Four times a day (QID) | RESPIRATORY_TRACT | Status: DC
  Administered 2022-09-17 – 2022-09-18 (×6): 0.5 mg via RESPIRATORY_TRACT
  Filled 2022-09-17 (×6): qty 2.5

## 2022-09-17 MED ORDER — LORAZEPAM 2 MG/ML IJ SOLN
0.5000 mg | Freq: Once | INTRAMUSCULAR | Status: AC | PRN
  Administered 2022-09-17: 0.5 mg via INTRAVENOUS
  Filled 2022-09-17: qty 1

## 2022-09-17 MED ORDER — INSULIN ASPART 100 UNIT/ML IJ SOLN
0.0000 [IU] | Freq: Every day | INTRAMUSCULAR | Status: DC
  Administered 2022-09-17: 5 [IU] via SUBCUTANEOUS
  Administered 2022-09-17: 3 [IU] via SUBCUTANEOUS

## 2022-09-17 MED ORDER — INSULIN GLARGINE-YFGN 100 UNIT/ML ~~LOC~~ SOLN
12.0000 [IU] | Freq: Two times a day (BID) | SUBCUTANEOUS | Status: DC
  Administered 2022-09-17 (×2): 12 [IU] via SUBCUTANEOUS
  Filled 2022-09-17 (×4): qty 0.12

## 2022-09-17 MED ORDER — POTASSIUM CHLORIDE CRYS ER 20 MEQ PO TBCR
20.0000 meq | EXTENDED_RELEASE_TABLET | Freq: Every day | ORAL | Status: DC
  Administered 2022-09-17: 20 meq via ORAL
  Filled 2022-09-17: qty 1

## 2022-09-17 MED ORDER — ONDANSETRON HCL 4 MG/2ML IJ SOLN
4.0000 mg | Freq: Once | INTRAMUSCULAR | Status: AC
  Administered 2022-09-17: 4 mg via INTRAVENOUS
  Filled 2022-09-17: qty 2

## 2022-09-17 MED ORDER — METHYLPREDNISOLONE SODIUM SUCC 125 MG IJ SOLR
80.0000 mg | Freq: Two times a day (BID) | INTRAMUSCULAR | Status: DC
  Administered 2022-09-17: 80 mg via INTRAVENOUS
  Filled 2022-09-17: qty 2

## 2022-09-17 MED ORDER — RISPERIDONE 1 MG PO TABS: 1.0000 mg | ORAL_TABLET | ORAL | Status: DC

## 2022-09-17 MED ORDER — ONDANSETRON HCL 4 MG/2ML IJ SOLN: 4.0000 mg | Freq: Four times a day (QID) | INTRAMUSCULAR | Status: DC | PRN

## 2022-09-17 MED ORDER — LORAZEPAM 2 MG/ML IJ SOLN
0.5000 mg | Freq: Four times a day (QID) | INTRAMUSCULAR | Status: DC | PRN
  Administered 2022-09-17: 0.5 mg via INTRAVENOUS
  Filled 2022-09-17 (×2): qty 1

## 2022-09-17 MED ORDER — SODIUM CHLORIDE 0.9 % IV BOLUS
500.0000 mL | Freq: Once | INTRAVENOUS | Status: AC | PRN
  Administered 2022-09-17: 500 mL via INTRAVENOUS

## 2022-09-17 MED ORDER — INSULIN GLARGINE-YFGN 100 UNIT/ML ~~LOC~~ SOLN
12.0000 [IU] | Freq: Every day | SUBCUTANEOUS | Status: DC
  Administered 2022-09-17: 12 [IU] via SUBCUTANEOUS
  Filled 2022-09-17 (×2): qty 0.12

## 2022-09-17 MED ORDER — NITROGLYCERIN 0.4 MG SL SUBL
SUBLINGUAL_TABLET | SUBLINGUAL | Status: AC
  Filled 2022-09-17: qty 1

## 2022-09-17 MED ORDER — BUMETANIDE 2 MG PO TABS
2.0000 mg | ORAL_TABLET | Freq: Two times a day (BID) | ORAL | Status: DC
  Administered 2022-09-17: 2 mg via ORAL
  Filled 2022-09-17 (×2): qty 1

## 2022-09-17 MED ORDER — LEVALBUTEROL HCL 1.25 MG/0.5ML IN NEBU
1.2500 mg | INHALATION_SOLUTION | Freq: Four times a day (QID) | RESPIRATORY_TRACT | Status: DC
  Administered 2022-09-17 – 2022-09-18 (×6): 1.25 mg via RESPIRATORY_TRACT
  Filled 2022-09-17 (×7): qty 0.5

## 2022-09-17 MED ORDER — RISPERIDONE 2 MG PO TABS
2.0000 mg | ORAL_TABLET | Freq: Every day | ORAL | Status: DC
  Administered 2022-09-17: 2 mg via ORAL
  Filled 2022-09-17: qty 1

## 2022-09-17 MED ORDER — FUROSEMIDE 10 MG/ML IJ SOLN
80.0000 mg | Freq: Once | INTRAMUSCULAR | Status: AC
  Administered 2022-09-17: 80 mg via INTRAVENOUS
  Filled 2022-09-17: qty 8

## 2022-09-17 MED ORDER — OXYCODONE-ACETAMINOPHEN 5-325 MG PO TABS
1.0000 | ORAL_TABLET | Freq: Four times a day (QID) | ORAL | Status: DC | PRN
  Administered 2022-09-17 (×3): 1 via ORAL
  Filled 2022-09-17 (×4): qty 1

## 2022-09-17 MED ORDER — MELATONIN 3 MG PO TABS
3.0000 mg | ORAL_TABLET | Freq: Every evening | ORAL | Status: DC | PRN
  Administered 2022-09-17: 3 mg via ORAL
  Filled 2022-09-17: qty 1

## 2022-09-17 NOTE — ED Notes (Signed)
Patient leaving in stable condition with her belongings and staff.

## 2022-09-17 NOTE — Progress Notes (Signed)
Pt took bipap off. Placed pt back on 3L [place].

## 2022-09-17 NOTE — Assessment & Plan Note (Addendum)
Uncontrolled with Hyperglycemia.   Continue glucose cover and monitoring with insulin sliding scale. Continue with basal insulin to 12 mg bid. Capillary glucose 130 to 285 mg/dl.  Continue statin therapy.

## 2022-09-17 NOTE — H&P (Signed)
History and Physical      [name] [name] JQZ:[id number] DOB: 1970/12/26 DOA: 09/16/2022  PCP: Patient, No Pcp Per (will further assess) Patient coming from: home   I have personally briefly reviewed patient's old medical records in [facility]  Chief Complaint: Shortness of breath  HPI: [name] [name] is a 52 y.o. female with medical history significant for COPD, chronic systolic heart failure, type 2 diabetes mellitus, hypothyroidism, severe much regurgitation, CKD 3 A-CYS with baseline creatinine range 1.1-1.4, chronically elevated troponin, who is admitted to [facility] on 09/16/2022 with acute COPD exacerbation after presenting from home to [facility] ED complaining of shortness of breath.   The patient reports 2 days of progressive shortness of breath associated with some expiratory wheezing.  She denies any associated orthopnea, PND, or worsening of peripheral edema.  No associated any new cough or any subjective fever, chills, rigors, or generalized myalgias.  Denies any recent chest pain, palpitations, diaphoresis, dizziness, presyncope, or syncope.  No recent trauma.  Denies any recent calf tenderness or any other extremity erythema.  She was recently hospitalized in the [facility] system from 08/30/2022 to 09/02/2022, with hospitalization reported to include acute on chronic systolic heart failure.  She notes good compliance with her outpatient diuretic regimen that includes Bumex 2 mg p.o. twice daily.  Most recent echocardiogram was performed on 08/27/2022 was notable for LVEF 20 to 25%, no focal motion is, moderate to severely dilated biventricular internal cavity size, normal right ventricular systolic function, severe mitral regurgitation.  She also confirms a history of underlying COPD, and conveys that she continues to smoke cigarettes.  Not on a scheduled breathing treatments as an outpatient.  In the setting of the above, the patient contacted EMS who brought the patient  to [facility] emergency department for further evaluation management above.  And route to the ED, EMS administered nebulizer treatment as well as Solu-Medrol.     ED Course:  Vital signs in the ED were notable for the following: Afebrile; heart is in the 90s to low 100s; systolic blood pressures initially in the 80s, which improved into the range of 90s to 120s following interval IV fluids, as further quantified below; respiratory rate 16-28, oxygen saturation 92 to 96% on room air, followed by initiation of 2 L nasal cannula for patient comfort, upon which the patient's and sitting oxygen saturations been noted to be in the range 98 to 100% on room air.  Labs were notable for the following: CMP notable for the following: Sodium 139, potassium 4.2, Cardide 28, creatinine 1.39, glucose 270, calcium, just for mild hyperammonemia noted to be 8.9, albumin 3.5, otherwise, liver enzymes of the results.  High sensitive troponin I initially 49, with repeat value trending up slightly to 53, which is relative demonstrates a prior high sensitive troponin I data point of 52 on 09/06/2022, and also relative to 1343 on 08/26/2022.  BNP 526 compared to most recent prior value of 882 on 08/28/2022, 1100 on 08/27/2022, and 1400 on 08/26/2022.  CBC notable for white blood cell count 6000 with 60% neutrophils, hemoglobin 12.2.  Per my interpretation, EKG in ED demonstrated the following: In comparison to most recent prior EKG from 08/28/2022, presenting EKG shows sinus tachycardia with heart rate 104, QTc of 502, otherwise, intervals appear normal; nonspecific T wave inversion in aVL and V6, both of which appear unchanged relative to patient's prior EKG, will demonstrated no evidence of ST changes, including evidence of ST elevation.  Imaging  and additional notable ED work-up: Chest x-ray, 1 view, per formal radiology read, shows mild cardiomegaly, in the absence of any evidence of acute cardiopulmonary process, including no  evidence of infiltrate, edema, effusion, or pneumothorax.  While in the ED, the following were administered: Lactated Ringer's x 500 cc bolus followed by Lasix 80 mg IV x 1, Zofran 4 mg IV x 1, butyryl nebulizer, Percocet 5/325 mg x 2 tabs.  Subsequently, the patient was admitted for further evaluation management of presenting acute COPD exacerbation, with presenting EKG showing evidence of QTc prolongation.      Review of Systems: As per HPI otherwise 10 point review of systems negative.   Past Medical History:  Diagnosis Date   Anxiety disorder    Asthma    Bipolar affective disorder    Blood transfusion without reported diagnosis    CHF (congestive heart failure)    COPD (chronic obstructive pulmonary disease)    Depression    Diabetes mellitus    Migraine    Schizophrenia     Past Surgical History:  Procedure Laterality Date   KNEE ARTHROSCOPY     x 2   KNEE SURGERY Left    Incision made and knee cleaned out   TUBAL LIGATION      Social History:  reports that she has been smoking cigarettes. She has a 29.00 pack-year smoking history. She has never used smokeless tobacco. She reports that she does not currently use alcohol. She reports that she does not use drugs.   Allergies  Allergen Reactions   Tramadol Nausea Only and Rash   Wellbutrin [Bupropion] Other (See Comments)    rage   Nicotine Rash    Patient states she is allergic to the Nicotine patch and they cause her to break out in a rash.  She states she can smoke cigarettes.    Family History  Problem Relation Age of Onset   Bipolar disorder Mother    Hypertension Father    Diabetes Father     Family history reviewed and not pertinent    Prior to Admission medications   Medication Sig Start Date End Date Taking? Authorizing Provider  acetaminophen (TYLENOL) 325 MG tablet Take 1 tablet (325 mg total) by mouth every 6 (six) hours as needed for headache or mild pain. 09/02/22  Yes Wouk, Wilfred Curtis, MD   ASPIRIN LOW DOSE 81 MG tablet Take 1 tablet (81 mg total) by mouth daily. 08/23/22  Yes Gherghe, Daylene Katayama, MD  bumetanide (BUMEX) 2 MG tablet Take 1 tablet (2 mg total) by mouth 2 (two) times daily. 08/29/22 09/28/22 Yes Arrien, York Ram, MD  insulin glargine, 1 Unit Dial, (TOUJEO SOLOSTAR) 300 UNIT/ML Solostar Pen Inject 24 Units into the skin at bedtime. 09/02/22 10/02/22 Yes Wouk, Wilfred Curtis, MD  isosorbide mononitrate (IMDUR) 60 MG 24 hr tablet Take 1 tablet (60 mg total) by mouth daily. 08/23/22 09/22/22 Yes Gherghe, Daylene Katayama, MD  metoprolol succinate (TOPROL-XL) 25 MG 24 hr tablet Take 1 tablet (25 mg total) by mouth daily. 08/23/22 09/22/22 Yes Gherghe, Daylene Katayama, MD  nitroGLYCERIN (NITROSTAT) 0.4 MG SL tablet Place 1 tablet (0.4 mg total) under the tongue every 5 (five) minutes as needed for chest pain. 08/23/22  Yes Gherghe, Daylene Katayama, MD  oxyCODONE-acetaminophen (PERCOCET) 10-325 MG tablet Take 1 tablet by mouth every 6 (six) hours as needed for pain. 09/02/22  Yes Wouk, Wilfred Curtis, MD  potassium chloride SA (KLOR-CON M) 20 MEQ tablet Take 1 tablet (20  mEq total) by mouth daily. Take with bumetanide. 08/29/22  Yes Arrien, York Ram, MD  risperiDONE (RISPERDAL) 2 MG tablet Take one-half tablet in the morning and one tablet in the evening. 08/23/22  Yes Gherghe, Daylene Katayama, MD  rosuvastatin (CRESTOR) 20 MG tablet Take 1 tablet (20 mg total) by mouth daily. 08/30/22 09/29/22 Yes Arrien, York Ram, MD  blood glucose meter kit and supplies Dispense based on patient and insurance preference. Use up to four times daily as directed. (FOR ICD-10 E10.9, E11.9). 01/06/18   Rodolph Bong, MD  Insulin Pen Needle 32G X 4 MM MISC Inject 1 each into the skin in the morning. 09/02/22   Wouk, Wilfred Curtis, MD  methimazole (TAPAZOLE) 5 MG tablet Take 1 tablet (5 mg total) by mouth daily for 21 days. 08/29/22 09/19/22  Arrien, York Ram, MD  benztropine (COGENTIN) 1 MG tablet Take 1 tablet (1 mg  total) by mouth 2 (two) times daily. 12/23/18 06/04/20  Malvin Johns, MD  insulin aspart (NOVOLOG) 100 UNIT/ML injection Inject 0-9 Units into the skin 3 (three) times daily with meals. Patient not taking: Reported on 06/03/2020 12/16/18 06/04/20  Lanae Boast, MD     Objective    Physical Exam: Vitals:   09/16/22 2330 09/16/22 2345 09/17/22 0015 09/17/22 0021  BP: 110/62 98/64 94/75    Pulse: (!) 107 (!) 105 (!) 109 (!) 108  Resp: (!) 30 (!) 30 (!) 21 20  Temp:      TempSrc:      SpO2: 92% 98% 92% 94%  Weight:      Height:        General: appears to be stated age; alert, oriented; mildly increased work of breathing noted Skin: warm, dry, no rash Head:  AT/[place] Mouth:  Oral mucosa membranes appear moist, normal dentition Neck: supple; trachea midline Heart:  RRR; did not appreciate any M/R/G Lungs: Mild bilateral expiratory wheezes noted, but otherwise CTAB, did not appreciate any rales, or rhonchi Abdomen: + BS; soft, ND, NT Vascular: 2+ pedal pulses b/l; 2+ radial pulses b/l Extremities: no peripheral edema, no muscle wasting Neuro: strength and sensation intact in upper and lower extremities b/l    Labs on Admission: I have personally reviewed following labs and imaging studies  CBC: Recent Labs  Lab 09/16/22 2120  WBC 6.0  NEUTROABS 3.6  HGB 12.2  HCT 38.0  MCV 89.0  PLT 195   Basic Metabolic Panel: Recent Labs  Lab 09/16/22 2120  NA 139  K 4.2  CL 98  CO2 28  GLUCOSE 270*  BUN 20  CREATININE 1.39*  CALCIUM 8.5*   GFR: Estimated Creatinine Clearance: 51.1 mL/min (A) (by C-G formula based on SCr of 1.39 mg/dL (H)). Liver Function Tests: Recent Labs  Lab 09/16/22 2120  AST 19  ALT 17  ALKPHOS 90  BILITOT 0.7  PROT 6.2*  ALBUMIN 3.5   No results for input(s): "LIPASE", "AMYLASE" in the last 168 hours. No results for input(s): "AMMONIA" in the last 168 hours. Coagulation Profile: No results for input(s): "INR", "PROTIME" in the last 168  hours. Cardiac Enzymes: No results for input(s): "CKTOTAL", "CKMB", "CKMBINDEX", "TROPONINI" in the last 168 hours. BNP (last 3 results) No results for input(s): "PROBNP" in the last 8760 hours. HbA1C: No results for input(s): "HGBA1C" in the last 72 hours. CBG: No results for input(s): "GLUCAP" in the last 168 hours. Lipid Profile: No results for input(s): "CHOL", "HDL", "LDLCALC", "TRIG", "CHOLHDL", "LDLDIRECT" in the last 72 hours. Thyroid  Function Tests: No results for input(s): "TSH", "T4TOTAL", "FREET4", "T3FREE", "THYROIDAB" in the last 72 hours. Anemia Panel: No results for input(s): "VITAMINB12", "FOLATE", "FERRITIN", "TIBC", "IRON", "RETICCTPCT" in the last 72 hours. Urine analysis:    Component Value Date/Time   COLORURINE YELLOW 09/06/2022 0108   APPEARANCEUR HAZY (A) 09/06/2022 0108   LABSPEC 1.026 09/06/2022 0108   PHURINE 5.0 09/06/2022 0108   GLUCOSEU >=500 (A) 09/06/2022 0108   HGBUR NEGATIVE 09/06/2022 0108   HGBUR trace-intact 11/25/2008 1530   BILIRUBINUR NEGATIVE 09/06/2022 0108   KETONESUR NEGATIVE 09/06/2022 0108   PROTEINUR NEGATIVE 09/06/2022 0108   UROBILINOGEN 0.2 05/12/2014 2315   NITRITE NEGATIVE 09/06/2022 0108   LEUKOCYTESUR NEGATIVE 09/06/2022 0108    Radiological Exams on Admission: DG Chest Portable 1 View  Result Date: 09/16/2022 CLINICAL DATA:  Wheezing short of breath EXAM: PORTABLE CHEST 1 VIEW COMPARISON:  08/30/2022 FINDINGS: Mild cardiomegaly. No acute airspace disease or pleural effusion. Negative for pneumothorax. IMPRESSION: No active disease. Mild cardiomegaly. Electronically Signed   By: Jasmine Pang M.D.   On: 09/16/2022 22:03      Assessment/Plan   Principal Problem:   Acute exacerbation of chronic obstructive pulmonary disease (COPD) Active Problems:   Chronic systolic CHF (congestive heart failure)   DM2 (diabetes mellitus, type 2)   Hyperthyroidism   Prolonged QT interval   Severe mitral regurgitation   CKD stage 3a,  GFR 45-59 ml/min     #) Acute COPD exacerbation: in the context of a documented history of COPD, diagnosis of acute exacerbation on the basis of 2 days of progressive shortness of breath associated with new onset wheezing, with presenting CXR showing no evidence of acute cardiopulmonary process, including no evidence of infiltrate, edema, effusion, or pneumothorax.  Clinically radiographically, no evidence of underlying bacterial pneumonia at this time.  Will also check COVID-19 PCR.  It is noted the patient has a long smoking history, and that she continues to smoke at this time.  There may also be room for optimization of her outpatient respiratory regimen, as she is not currently on any scheduled breathing treatments at home.  Clinically and radiographically, presentation appears less suggestive of acute decompensated heart failure, including clear chest x-ray.  Additionally, presenting BNP is one third of what it was a few weeks ago.  This is in the context of good interval compliance on Bumex 2 mg p.o. twice daily.  Additionally, ACS appears less likely in the absence of chest pain, with EKG showing no evidence of acute ischemic changes, and with nonelevated troponin results relative to her baseline elevation in the 30s to 50s over the course of the last 2 years, while also noting that her presenting troponin is down from 1343 in mid March 2024. Clinically, acute PE also appears to be less likely at this time. Will add-on procalcitonin to further eval for underlying pna.  Of note, in the setting of mild tachycardia, will pursue Xopenex as preferred short acting beta-2 agonist.   Plan: monitor continuous pulse oxymetry. Monitor on telemetry. Solumedrol. Scheduled Xopenex/ipratropium nebulizers scheduled q6 hours. Prn Xopenex inhaler.  CMP in the morning. Repeat CBC in the morning. Check serum Mg and Phos levels. Will attempt additional chart review to evaluate most recent PFT results. Will start  doxycycline for benefit of shortened duration of hospitalization associated with antibiotic initiation in the setting of acute COPD exacerbation. Check blood gas. Add-on procalcitonin.  Check COVID-19 PCR.  Check urinary drug screen.              #)  QTc prolongation: Presenting EKG demonstrates QTc of 502 ms.   Plan: Monitor on telemetry.  Add-on serum magnesium level.  Repeat EKG in the morning to monitor interval degree of QTc prolongation.                  #) Type 2 Diabetes Mellitus: documented history of such. Home insulin regimen: Glargine 24 units SQ nightly. Home oral hypoglycemic agents: None. presenting blood sugar: 270, with increased risk for further trend and hyperglycemic correction given plan for additional corticosteroids in the setting of presenting acute COPD exacerbation. Most recent A1c noted to be 11.0 when checked on 08/27/2022. in terms of initial dose of basal insulin to be started during this hospitalization, will resume approximately half of outpatient dose in order to reduce risk for ensuing hypoglycemia   Plan: accuchecks QAC and HS with low dose SSI.  Lantus 12 units SQ nightly, as further detailed above.               #) Chronic systolic heart failure: documented history of such, with most recent echocardiogram performed on 08/27/2022, which is notable for LVEF 2025%, moderate to severely dilated left ventricular internal cavity size, normal right ventricular systolic function, as well as severe mitral regurgitation. No clinical or evidence to suggest acutely decompensated heart failure at this time, including clear appearing presenting chest x-ray, as well as BNP, which continues to trend down, now one third of what it was a few weeks ago, as further quantified above. home diuretic regimen reportedly consists of the following: Bumex 2 mg p.o. twice daily.  Additionally, she is on potassium chloride 20 equivalents p.o. daily.  In the  absence of clinical or radiographic evidence to suggest acutely onset heart failure at this time, will refrain from additional IV diuresis at this time, with plan to resume home diuretic regimen in the morning, with close monitoring of interval volume status, as further detailed below  Plan: monitor strict I's & O's and daily weights. Repeat BMP in AM. Check serum mag level. Continue home diuretic regimen. Continue home potassium chloride 20 equivalents p.o. daily.               #) hyperthyroidism: Documented history of such, with the patient on methimazole as an outpatient.  In the setting of her mild presenting tachycardia, will check TSH level.  Plan: Continue home methimazole and check TSH level.             #) Severe mitral regurgitation: Documented history of such, with most recent echocardiogram on 08/27/2022 confirming severe mitral regurgitation, increasing her risk for acute volume overload.  However, as described above, presenting chest x-ray shows no evidence of acute cardiopulmonary process, with BNP trending down.   Plan: Will attempt additional chart review, including review of most recent discharge summary to gain further insight into previous plan regarding severe mitral regurgitation from a cardiac standpoint.  Monitor strict I's and O's and daily weights.  Continue home Bumex.               #) CKD Stage 3A: Documented history of such, with baseline creatinine 1.1-1.4, with presenting creatinine consistent with this baseline.    Plan: Monitor strict I's and O's and daily weights.  Attempt to avoid nephrotoxic agents.  CMP/magnesium level in the AM.              #) Chronic tobacco abuse: Patient conveys that they are a current smoker, having smoked 1 ppd for nearly 30  years.   Plan: Counseled the patient for less than 2 minutes on the importance of complete smoking discontinuation in particular the context of her presenting acute COPD  exacerbation.  Order placed for prn nicotine patch for use during this hospitalization.     DVT prophylaxis: SCD's   Code Status: Full code Family Communication: none Disposition Plan: Per Rounding Team Consults called: none;  Admission status: Inpatient     I SPENT GREATER THAN 75  MINUTES IN CLINICAL CARE TIME/MEDICAL DECISION-MAKING IN COMPLETING THIS ADMISSION.      Chaney Born Ulice Follett DO [facility]  From 7PM - 7AM   09/17/2022, 12:50 AM

## 2022-09-17 NOTE — Assessment & Plan Note (Addendum)
Patient with improvement in respiratory failure, hypoxemic and hypercapnic.  Rhinovirus.  Continue with bronchodilator therpay.  Oral corticosteroid therapy with prednisone 40 mg daily complete for 3 days.  Continue inhaled corticosteroids.  Airway clearing techniques.   Patient has been on supplemental 02 and not requiring on invasive mechanical ventilation Will check 02 sat on room air on ambulation, no need for  home non invasive ventilation.   Right pneumothorax, chest tube has been removed.

## 2022-09-17 NOTE — Assessment & Plan Note (Addendum)
-  Continue treatment with methimazole.

## 2022-09-17 NOTE — Progress Notes (Signed)
Progress Note   Patient: [name] [name] ZCH:[id number] DOB: 1971/02/26 DOA: 09/16/2022     0 DOS: the patient was seen and examined on 09/17/2022   Brief hospital course: [name] [name] was admitted to the hospital with the working diagnosis of heart failure exacerbation.  52 yo female with the past medical history of COPD, heart failure, T2DM, CKD, anxiety and hypothyroid who presented with dyspnea. Patient reported 2 days of progressive dyspnea, with wheezing, with no PND, orthopnea or lower extremity edema. Because persistent dyspnea she called EMS, she was found in distress, received IV methylprednisolone and bronchodilator therapy and transported to the ED. On her initial physical examination her blood pressure was 98/64, HR 105, RR 30 and 02 saturation 92%, heart with S1 and S2 present and rhythmic, respiratory with bilateral diffuse wheezing and rales, abdomen soft and trace lower extremity edema.   Na 139, K 4,2 Cl 98, bicarbonate 28, glucose 270, bun 20 cr 1,39 Mg 1,7 BNP 526 High sensitive troponin 49 and 53  Wbc 6,0 hgb 12,2 plt 195   EKG 104 bpm, left axis deviation, left anterior fascicular block, qtc 502, sinus rhythm, left atrial enlargement, with poor R R wave progression, no significant ST segment or T wave changes.  Positive PVC and positive LVH.   Chest radiograph with mild cardiomegaly, with bilateral hilar vascular congestions.   Patient was placed on bronchodilator therapy and received IV fluids for hypotension 500 cc LR. Then received furosemide 80 mg IV, with improvement in her symptoms.   04/09 acute worsening in dyspnea, increased work of breathing and tachycardia.  Repeated dose of furosemide 80 mg IV and placed on non invasive mechanical ventilation.    Assessment and Plan: * Acute exacerbation of chronic obstructive pulmonary disease (COPD) Patient has received IV steroids on admission. Chest radiograph with no clinical signs of pneumonia.   Plan to continue  bronchodilator therapy and supplemental 02 to keep 02 saturation 92% or greater.  Hold on further steroids for now.   Acute on chronic systolic CHF (congestive heart failure) Echocardiogram with reduced LV systolic function with EF 20 to 25%, LV cavity with moderate dilatation, no LVH, RV systolic function is preserved, no pericardial effusion, no significant valvular disease.   Patient examined in the ED with signs of volume overload and respiratory distress.  Placed on non invasive mechanical ventilation of acute hypoxemic respiratory failure and repeated dose of IV furosemide with improvement of her symptoms.   Plan to continue diuresis with furosemide 60 mg IV q12 hrs Continue with isosorbide and metoprolol.  Limited pharmacologic options due to reduced GFR.   Acute kidney injury superimposed on chronic kidney disease Renal function with serum cr at 1,47 with K at 3,9 and serum bicarbonate at 26. Na is 134, and Mg 1,8  Plan to continue close follow up renal function and electrolytes. Continue diuresis with furosemide.   Essential hypertension Resume isosorbide and continue metoprolol for blood pressure control.   Type 2 diabetes mellitus with hyperlipidemia Uncontrolled with Hyperglycemia.   Continue glucose cover and monitoring with insulin sliding scale. Increase dose of basal insulin to 12 mg bid.  Continue statin therapy.  Hyperthyroidism Continue with methimazole.   Bipolar 1 disorder Resume risperidone.         Subjective: Patient examined in the ED in respiratory distress, placed on Bipap with improvement in her symptoms.   Physical Exam: Vitals:   09/17/22 1323 09/17/22 1345 09/17/22 1350 09/17/22 1414  BP:  97/64  104/71  Pulse:    (!) 109  Resp:  (!) 27  (!) 23  Temp:   97.9 F (36.6 C) 97.7 F (36.5 C)  TempSrc:   Oral Oral  SpO2: 98%   90%  Weight:      Height:       Neurology awake, alert, deconditioned and ill looking appearing ENT with  mild pallor Cardiovascular with S1 and S2 present and tachycardic with no gallops, rubs or murmurs Positive JVD Trace lower extremity edema Respiratory with wheezing and rales bilaterally. Increase work of breathing and use of accessory muscle use Abdomen with no distention Data Reviewed:    Family Communication: no family at the bedside   Disposition: Status is: Inpatient Remains inpatient appropriate because: IV diuresis.   Planned Discharge Destination: Home    Author: Coralie Keens, MD 09/17/2022 3:34 PM  For on call review [email or web address].

## 2022-09-17 NOTE — Hospital Course (Addendum)
Mrs. Wampole was admitted to the hospital with the working diagnosis of heart failure exacerbation/ COPD exacerbation/ positive rhinovirus.   52 yo female with the past medical history of COPD, heart failure, T2DM, CKD, anxiety and hypothyroid who presented with dyspnea. Patient reported 2 days of progressive dyspnea, with wheezing, with no PND, orthopnea or lower extremity edema. Because persistent dyspnea she called EMS, she was found in distress, received IV methylprednisolone and bronchodilator therapy and transported to the ED. On her initial physical examination her blood pressure was 98/64, HR 105, RR 30 and 02 saturation 92%, heart with S1 and S2 present and rhythmic, respiratory with bilateral diffuse wheezing and rales, abdomen soft and trace lower extremity edema.   Na 139, K 4,2 Cl 98, bicarbonate 28, glucose 270, bun 20 cr 1,39 Mg 1,7 BNP 526 High sensitive troponin 49 and 53  Wbc 6,0 hgb 12,2 plt 195   EKG 104 bpm, left axis deviation, left anterior fascicular block, qtc 502, sinus rhythm, left atrial enlargement, with poor R R wave progression, no significant ST segment or T wave changes.  Positive PVC and positive LVH.   Chest radiograph with mild cardiomegaly, with bilateral hilar vascular congestions.   Patient was placed on bronchodilator therapy and received IV fluids for hypotension 500 cc LR. Then received furosemide 80 mg IV, with improvement in her symptoms.   04/09 acute worsening in dyspnea, increased work of breathing and tachycardia.  Repeated dose of furosemide 80 mg IV and placed on non invasive mechanical ventilation.  04/10 patient with worsening respiratory failure and was placed on invasive mechanical ventilation.   RIJ Central line was placed. Positive right pneumothorax, placed chest tube.  Positive for rhinovirus, treated with steroids and bronchodilator therapy.  04/13 liberated from mechanical ventilation.  04/15 transferred to [facility].  04/19 patient has  been clinically improving, she has not required non invasive mechanical ventilation over the last 4 days, while on the medical ward. Patient will not need non invasive mechanical ventilation at her discharge. 04/20 her oxygenation is improving.   09/29/22: Overall improving; still reporting shortness of breath and choking sensation with activity; pleuritic chest pain also described.  No fever, no nausea, no vomiting.  Patient is stabilizing. 09/30/22 patient clinically stable for discharge home.

## 2022-09-17 NOTE — Progress Notes (Signed)
RT placed pt on BIPAP at this time. Pt initally anxious about wearing the bipap mask. RT coached patient and expressed the importance of BIPAP. Pt is currently tolerating the mask and settings well. Breathing is labored but has improved upon wearing bipap. RT will continue to monitor as needed.

## 2022-09-17 NOTE — ED Notes (Signed)
Patient awake and alert, has took off her mask again, after being educated on the importance and talking on the phone.

## 2022-09-17 NOTE — Assessment & Plan Note (Addendum)
Echocardiogram with reduced LV systolic function with EF 20 to 25%, LV cavity with moderate dilatation, no LVH, RV systolic function is preserved, no pericardial effusion, no significant valvular disease.   Continue medical therapy with metoprolol. Will add spironolactone and SGLT 2 inh. Resume isosorbide low dow.  Hold on ARB for now to prevent hypotension.   Discontinue telemetry.

## 2022-09-17 NOTE — ED Notes (Signed)
Arrien, MD notified of CBG 431.

## 2022-09-17 NOTE — ED Provider Notes (Signed)
Called to the bedside by patient for difficulty breathing.  She was diaphoretic and tachycardic satting 82% on her nasal cannula.  Patient was started albuterol nebulizer but had persistently increased work of breathing and was escalated to BiPAP.  Dr. Ella Jubilee from medicine updated and is at the bedside now.  Patient also ordered for magnesium and additional dose of Lasix.  Did require Ativan due to anxiety with facemask and BiPAP.   Rondel Baton, MD 09/17/22 [phone]

## 2022-09-17 NOTE — Assessment & Plan Note (Addendum)
Systolic blood pressure at 100's Continue diuresis with bumetanide, SGLT 2 inh and spironolactone.  Continue metoprolol and isosorbide.

## 2022-09-17 NOTE — Assessment & Plan Note (Addendum)
Continue with risperidone.  Today with increased anxiety, will add as needed lorazepam while she is in the hospital.

## 2022-09-17 NOTE — Progress Notes (Signed)
MEWS Progress Note  Patient Details Name: [name] [name] MRN: [id number] DOB: 1970/08/13 Today's Date: 09/17/2022   MEWS Flowsheet Documentation:  Assess: MEWS Score Temp: 97.7 F (36.5 C) BP: 104/71 MAP (mmHg): 83 Pulse Rate: (!) 109 ECG Heart Rate: (!) 109 Resp: (!) 23 Level of Consciousness: Alert SpO2: 90 % O2 Device: Nasal Cannula O2 Flow Rate (L/min): 3 L/min FiO2 (%): 60 % Assess: MEWS Score MEWS Temp: 0 MEWS Systolic: 0 MEWS Pulse: 1 MEWS RR: 1 MEWS LOC: 0 MEWS Score: 2 MEWS Score Color: Yellow Assess: SIRS CRITERIA SIRS Temperature : 0 SIRS Respirations : 1 SIRS Pulse: 1 SIRS WBC: 0 SIRS Score Sum : 2 SIRS Temperature : 0 SIRS Pulse: 1 SIRS Respirations : 1 SIRS WBC: 0 SIRS Score Sum : 2 Assess: if the MEWS score is Yellow or Red Were vital signs taken at a resting state?: Yes Focused Assessment: No change from prior assessment Does the patient meet 2 or more of the SIRS criteria?: No Does the patient have a confirmed or suspected source of infection?: No Provider and Rapid Response Notified?: No MEWS guidelines implemented : Yes, yellow Treat MEWS Interventions: Considered administering scheduled or prn medications/treatments as ordered Take Vital Signs Increase Vital Sign Frequency : Yellow: Q2hr x1, continue Q4hrs until patient remains green for 12hrs Escalate MEWS: Escalate: Yellow: Discuss with charge nurse and consider notifying provider and/or RRT Provider Notification Date Provider Notified: 09/17/22 Time Provider Notified: 0720 Test performed and critical result: CBG 435 Date Critical Result Received: 09/17/22 Time Critical Result Received: 0717 Provider response: Other (Comment) (will modify insulin order) Date of Provider Response: 09/17/22 Time of Provider Response: 0722      Ileana Ladd 09/17/2022, 2:28 PM

## 2022-09-17 NOTE — ED Notes (Signed)
ED TO INPATIENT HANDOFF REPORT  ED Nurse Name and Phone #: Lolita Rieger Name/Age/Gender [name] [name] 52 y.o. female Room/Bed: 009C/009C  Code Status   Code Status: Full Code  Home/SNF/Other Homeless Patient oriented to: self, place, time, and situation Is this baseline? Yes   Triage Complete: Triage complete  Chief Complaint Acute exacerbation of chronic obstructive pulmonary disease (COPD) [J44.1]  Triage Note Pt BIBA from home after calling for SOB x2 days. Pt recently admitted for same. Pt has hx of CHF. Attempted to ambulate to stretcher but had to sit after about 92ft. 92% RA, increased to 98% on 8L w/ neb treatment. Received 2 duonebs, 1 atrovent, 125mg  solu-medrol en route, expiratory wheezes present. Patient in no obvious distress, requesting ice chips at this time.    Allergies Allergies  Allergen Reactions   Tramadol Nausea Only and Rash   Wellbutrin [Bupropion] Other (See Comments)    rage   Nicotine Rash    Patient states she is allergic to the Nicotine patch and they cause her to break out in a rash.  She states she can smoke cigarettes.    Level of Care/Admitting Diagnosis ED Disposition     ED Disposition  Admit   Condition  --   Comment  Hospital Area: [facility] [[id number]]  Level of Care: Progressive [102]  Admit to Progressive based on following criteria: MULTISYSTEM THREATS such as stable sepsis, metabolic/electrolyte imbalance with or without encephalopathy that is responding to early treatment.  May admit patient to [facility] or [facility] if equivalent level of care is available:: No  Covid Evaluation: Asymptomatic - no recent exposure (last 10 days) testing not required  Diagnosis: Acute exacerbation of chronic obstructive pulmonary disease (COPD) [235361]  Admitting Physician: Angie Fava [[id number]]  Attending Physician: Angie Fava [[id number]]  Certification:: I certify this patient will need inpatient  services for at least 2 midnights  Estimated Length of Stay: 2          B Medical/Surgery History Past Medical History:  Diagnosis Date   Anxiety disorder    Asthma    Bipolar affective disorder    Blood transfusion without reported diagnosis    CHF (congestive heart failure)    COPD (chronic obstructive pulmonary disease)    Depression    Diabetes mellitus    Migraine    Schizophrenia    Past Surgical History:  Procedure Laterality Date   KNEE ARTHROSCOPY     x 2   KNEE SURGERY Left    Incision made and knee cleaned out   TUBAL LIGATION       A IV Location/Drains/Wounds Patient Lines/Drains/Airways Status     Active Line/Drains/Airways     Name Placement date Placement time Site Days   Peripheral IV 09/16/22 20 G Left;Posterior Hand 09/16/22  2109  Hand  1   Peripheral IV 09/17/22 20 G Posterior;Right Hand 09/17/22  1039  Hand  less than 1   External Urinary Catheter 09/17/22  1045  --  less than 1            Intake/Output Last 24 hours  Intake/Output Summary (Last 24 hours) at 09/17/2022 1313 Last data filed at 09/17/2022 0304 Gross per 24 hour  Intake 750 ml  Output 300 ml  Net 450 ml    Labs/Imaging Results for orders placed or performed during the hospital encounter of 09/16/22 (from the past 48 hour(s))  CBC with Differential  Status: None   Collection Time: 09/16/22  9:20 PM  Result Value Ref Range   WBC 6.0 4.0 - 10.5 K/uL   RBC 4.27 3.87 - 5.11 MIL/uL   Hemoglobin 12.2 12.0 - 15.0 g/dL   HCT 88.7 19.5 - 97.4 %   MCV 89.0 80.0 - 100.0 fL   MCH 28.6 26.0 - 34.0 pg   MCHC 32.1 30.0 - 36.0 g/dL   RDW 71.8 55.0 - 15.8 %   Platelets 195 150 - 400 K/uL   nRBC 0.0 0.0 - 0.2 %   Neutrophils Relative % 60 %   Neutro Abs 3.6 1.7 - 7.7 K/uL   Lymphocytes Relative 28 %   Lymphs Abs 1.7 0.7 - 4.0 K/uL   Monocytes Relative 7 %   Monocytes Absolute 0.4 0.1 - 1.0 K/uL   Eosinophils Relative 4 %   Eosinophils Absolute 0.2 0.0 - 0.5 K/uL    Basophils Relative 1 %   Basophils Absolute 0.0 0.0 - 0.1 K/uL   Immature Granulocytes 0 %   Abs Immature Granulocytes 0.02 0.00 - 0.07 K/uL    Comment: Performed at [facility], [address], [place], [place] [address]  Comprehensive metabolic panel     Status: Abnormal   Collection Time: 09/16/22  9:20 PM  Result Value Ref Range   Sodium 139 135 - 145 mmol/L   Potassium 4.2 3.5 - 5.1 mmol/L   Chloride 98 98 - 111 mmol/L   CO2 28 22 - 32 mmol/L   Glucose, Bld 270 (H) 70 - 99 mg/dL    Comment: Glucose reference range applies only to samples taken after fasting for at least 8 hours.   BUN 20 6 - 20 mg/dL   Creatinine, Ser 4.93 (H) 0.44 - 1.00 mg/dL   Calcium 8.5 (L) 8.9 - 10.3 mg/dL   Total Protein 6.2 (L) 6.5 - 8.1 g/dL   Albumin 3.5 3.5 - 5.0 g/dL   AST 19 15 - 41 U/L   ALT 17 0 - 44 U/L   Alkaline Phosphatase 90 38 - 126 U/L   Total Bilirubin 0.7 0.3 - 1.2 mg/dL   GFR, Estimated 45 (L) >60 mL/min    Comment: (NOTE) Calculated using the CKD-EPI Creatinine Equation (2021)    Anion gap 13 5 - 15    Comment: Performed at [facility], [address] [place], [place] [address]  Troponin I (High Sensitivity)     Status: Abnormal   Collection Time: 09/16/22  9:20 PM  Result Value Ref Range   Troponin I (High Sensitivity) 49 (H) <18 ng/L    Comment: (NOTE) Elevated high sensitivity troponin I (hsTnI) values and significant  changes across serial measurements may suggest ACS but many other  chronic and acute conditions are known to elevate hsTnI results.  Refer to the "Links" section for chest pain algorithms and additional  guidance. Performed at [facility], [address], [place], [place] [address]   Brain natriuretic peptide     Status: Abnormal   Collection Time: 09/16/22  9:20 PM  Result Value Ref Range   B Natriuretic Peptide 526.6 (H) 0.0 - 100.0 pg/mL    Comment: Performed at [facility], [address] [place], [place] [address]   Troponin I (High Sensitivity)     Status: Abnormal   Collection Time: 09/16/22 10:55 PM  Result Value Ref Range   Troponin I (High Sensitivity) 53 (H) <18 ng/L  Comment: (NOTE) Elevated high sensitivity troponin I (hsTnI) values and significant  changes across serial measurements may suggest ACS but many other  chronic and acute conditions are known to elevate hsTnI results.  Refer to the "Links" section for chest pain algorithms and additional  guidance. Performed at [facility], [address] [place], [place] [address]   Magnesium     Status: None   Collection Time: 09/16/22 10:55 PM  Result Value Ref Range   Magnesium 1.7 1.7 - 2.4 mg/dL    Comment: Performed at [facility], [address], [place], [place] [address]  CBG monitoring, ED     Status: Abnormal   Collection Time: 09/17/22 12:57 AM  Result Value Ref Range   Glucose-Capillary 366 (H) 70 - 99 mg/dL    Comment: Glucose reference range applies only to samples taken after fasting for at least 8 hours.  SARS Coronavirus 2 by RT PCR (hospital order, performed in [facility] hospital lab) *cepheid single result test* Anterior Nasal Swab     Status: None   Collection Time: 09/17/22  1:01 AM   Specimen: Anterior Nasal Swab  Result Value Ref Range   SARS Coronavirus 2 by RT PCR NEGATIVE NEGATIVE    Comment: Performed at [facility], [address] [place], [place] [address]  CBC with Differential/Platelet     Status: Abnormal   Collection Time: 09/17/22  3:13 AM  Result Value Ref Range   WBC 7.0 4.0 - 10.5 K/uL   RBC 4.00 3.87 - 5.11 MIL/uL   Hemoglobin 11.1 (L) 12.0 - 15.0 g/dL   HCT 91.4 78.2 - 95.6 %   MCV 91.8 80.0 - 100.0 fL   MCH 27.8 26.0 - 34.0 pg   MCHC 30.2 30.0 - 36.0 g/dL   RDW 21.3 08.6 - 57.8 %   Platelets 172 150 - 400 K/uL   nRBC 0.0 0.0 - 0.2 %   Neutrophils Relative % 93 %   Neutro Abs 6.5 1.7 - 7.7 K/uL   Lymphocytes Relative 6 %   Lymphs Abs 0.4 (L) 0.7 - 4.0 K/uL    Monocytes Relative 1 %   Monocytes Absolute 0.1 0.1 - 1.0 K/uL   Eosinophils Relative 0 %   Eosinophils Absolute 0.0 0.0 - 0.5 K/uL   Basophils Relative 0 %   Basophils Absolute 0.0 0.0 - 0.1 K/uL   Immature Granulocytes 0 %   Abs Immature Granulocytes 0.03 0.00 - 0.07 K/uL    Comment: Performed at [facility], [address], [place], [place] [address]  Procalcitonin     Status: None   Collection Time: 09/17/22  3:13 AM  Result Value Ref Range   Procalcitonin <0.10 ng/mL    Comment:        Interpretation: PCT (Procalcitonin) <= 0.5 ng/mL: Systemic infection (sepsis) is not likely. Local bacterial infection is possible. (NOTE)       Sepsis PCT Algorithm           Lower Respiratory Tract                                      Infection PCT Algorithm    ----------------------------     ----------------------------         PCT < 0.25 ng/mL                PCT <  0.10 ng/mL          Strongly encourage             Strongly discourage   discontinuation of antibiotics    initiation of antibiotics    ----------------------------     -----------------------------       PCT 0.25 - 0.50 ng/mL            PCT 0.10 - 0.25 ng/mL               OR       >80% decrease in PCT            Discourage initiation of                                            antibiotics      Encourage discontinuation           of antibiotics    ----------------------------     -----------------------------         PCT >= 0.50 ng/mL              PCT 0.26 - 0.50 ng/mL               AND        <80% decrease in PCT             Encourage initiation of                                             antibiotics       Encourage continuation           of antibiotics    ----------------------------     -----------------------------        PCT >= 0.50 ng/mL                  PCT > 0.50 ng/mL               AND         increase in PCT                  Strongly encourage                                      initiation of  antibiotics    Strongly encourage escalation           of antibiotics                                     -----------------------------                                           PCT <= 0.25 ng/mL                                                 OR                                        >  80% decrease in PCT                                      Discontinue / Do not initiate                                             antibiotics  Performed at [facility], [address], [place], [place] [address]   Comprehensive metabolic panel     Status: Abnormal   Collection Time: 09/17/22  3:13 AM  Result Value Ref Range   Sodium 134 (L) 135 - 145 mmol/L   Potassium 3.9 3.5 - 5.1 mmol/L   Chloride 94 (L) 98 - 111 mmol/L   CO2 26 22 - 32 mmol/L   Glucose, Bld 506 (HH) 70 - 99 mg/dL    Comment: CRITICAL RESULT CALLED TO, READ BACK BY AND VERIFIED WITH R. TERRY,RN. [id number] 09/17/22. LPAIT Glucose reference range applies only to samples taken after fasting for at least 8 hours.    BUN 20 6 - 20 mg/dL   Creatinine, Ser 4.09 (H) 0.44 - 1.00 mg/dL   Calcium 8.1 (L) 8.9 - 10.3 mg/dL   Total Protein 5.9 (L) 6.5 - 8.1 g/dL   Albumin 3.3 (L) 3.5 - 5.0 g/dL   AST 21 15 - 41 U/L   ALT 16 0 - 44 U/L   Alkaline Phosphatase 93 38 - 126 U/L   Total Bilirubin 0.4 0.3 - 1.2 mg/dL   GFR, Estimated 42 (L) >60 mL/min    Comment: (NOTE) Calculated using the CKD-EPI Creatinine Equation (2021)    Anion gap 14 5 - 15    Comment: Performed at [facility], [address], [place], [place] [address]  Magnesium     Status: None   Collection Time: 09/17/22  3:13 AM  Result Value Ref Range   Magnesium 1.8 1.7 - 2.4 mg/dL    Comment: Performed at [facility], [address] [place], [place] [address]  Phosphorus     Status: None   Collection Time: 09/17/22  3:13 AM  Result Value Ref Range   Phosphorus 4.1 2.5 - 4.6 mg/dL    Comment: Performed at [facility], [address] [place], [place] [address]  TSH     Status: Abnormal   Collection Time: 09/17/22  3:13 AM  Result Value Ref Range   TSH 0.236 (L) 0.350 - 4.500 uIU/mL    Comment: Performed by a 3rd Generation assay with a functional sensitivity of <=0.01 uIU/mL. Performed at [facility], [address] [place], [place] [address]   Troponin I (High Sensitivity)     Status: Abnormal   Collection Time: 09/17/22  3:13 AM  Result Value Ref Range   Troponin I (High Sensitivity) 52 (H) <18 ng/L    Comment: (NOTE) Elevated high sensitivity troponin I (hsTnI) values and significant  changes across serial measurements may suggest ACS but many other  chronic and acute conditions are known to elevate hsTnI results.  Refer to the "Links" section for chest pain algorithms and additional  guidance. Performed at [facility], [address] [place], [place] [address]   I-Stat venous blood gas, ED     Status: Abnormal  Collection Time: 09/17/22  3:33 AM  Result Value Ref Range   pH, Ven 7.426 7.25 - 7.43   pCO2, Ven 42.7 (L) 44 - 60 mmHg   pO2, Ven 89 (H) 32 - 45 mmHg   Bicarbonate 28.1 (H) 20.0 - 28.0 mmol/L   TCO2 29 22 - 32 mmol/L   O2 Saturation 97 %   Acid-Base Excess 3.0 (H) 0.0 - 2.0 mmol/L   Sodium 134 (L) 135 - 145 mmol/L   Potassium 3.9 3.5 - 5.1 mmol/L   Calcium, Ion 1.03 (L) 1.15 - 1.40 mmol/L   HCT 35.0 (L) 36.0 - 46.0 %   Hemoglobin 11.9 (L) 12.0 - 15.0 g/dL   Sample type VENOUS   CBG monitoring, ED     Status: Abnormal   Collection Time: 09/17/22  7:17 AM  Result Value Ref Range   Glucose-Capillary 435 (H) 70 - 99 mg/dL    Comment: Glucose reference range applies only to samples taken after fasting for at least 8 hours.   DG Chest Portable 1 View  Result Date: 09/16/2022 CLINICAL DATA:  Wheezing short of breath EXAM: PORTABLE CHEST 1 VIEW COMPARISON:  08/30/2022 FINDINGS: Mild cardiomegaly. No acute airspace disease or pleural effusion. Negative for pneumothorax. IMPRESSION:  No active disease. Mild cardiomegaly. Electronically Signed   By: Jasmine PangKim  Fujinaga M.D.   On: 09/16/2022 22:03    Pending Labs Unresulted Labs (From admission, onward)     Start     Ordered   09/17/22 0500  Blood gas, venous  Tomorrow morning,   R        09/17/22 0018   09/17/22 0043  Rapid urine drug screen (hospital performed)  Add-on,   AD        09/17/22 0043            Vitals/Pain Today's Vitals   09/17/22 0800 09/17/22 1000 09/17/22 1029 09/17/22 1100  BP: 97/75  120/88 107/80  Pulse: (!) 109  (!) 132 (!) 117  Resp: (!) 22  (!) 34 (!) 35  Temp:  98.6 F (37 C)    TempSrc:  Oral    SpO2: 97%  95% 99%  Weight:      Height:      PainSc:        Isolation Precautions No active isolations  Medications Medications  acetaminophen (TYLENOL) tablet 650 mg (650 mg Oral Given 09/17/22 0635)    Or  acetaminophen (TYLENOL) suppository 650 mg ( Rectal See Alternative 09/17/22 0635)  melatonin tablet 3 mg (has no administration in time range)  doxycycline (VIBRAMYCIN) 100 mg in sodium chloride 0.9 % 250 mL IVPB (0 mg Intravenous Stopped 09/17/22 1207)  methylPREDNISolone sodium succinate (SOLU-MEDROL) 125 mg/2 mL injection 80 mg (80 mg Intravenous Given 09/17/22 0734)  levalbuterol (XOPENEX) nebulizer solution 1.25 mg (1.25 mg Nebulization Given 09/17/22 0733)  ipratropium (ATROVENT) nebulizer solution 0.5 mg (0.5 mg Nebulization Given 09/17/22 0733)  levalbuterol (XOPENEX) nebulizer solution 1.25 mg (has no administration in time range)  insulin aspart (novoLOG) injection 0-5 Units (5 Units Subcutaneous Given 09/17/22 0119)  aspirin EC tablet 81 mg (81 mg Oral Given 09/17/22 0932)  bumetanide (BUMEX) tablet 2 mg (2 mg Oral Given 09/17/22 0932)  methimazole (TAPAZOLE) tablet 5 mg (5 mg Oral Given 09/17/22 0933)  metoprolol succinate (TOPROL-XL) 24 hr tablet 25 mg (25 mg Oral Given 09/17/22 0932)  potassium chloride SA (KLOR-CON M) CR tablet 20 mEq (20 mEq Oral Given 09/17/22 0932)  rosuvastatin  (CRESTOR) tablet 20  mg (20 mg Oral Given 09/17/22 0932)  oxyCODONE-acetaminophen (PERCOCET/ROXICET) 5-325 MG per tablet 1 tablet (1 tablet Oral Given 09/17/22 1208)  LORazepam (ATIVAN) injection 0.5 mg (0.5 mg Intravenous Given 09/17/22 0433)  insulin aspart (novoLOG) injection 0-15 Units (has no administration in time range)  insulin glargine-yfgn (SEMGLEE) injection 12 Units (12 Units Subcutaneous Given 09/17/22 1037)  albuterol (PROVENTIL) (2.5 MG/3ML) 0.083% nebulizer solution (20 mg/hr Nebulization Given 09/16/22 2142)  lactated ringers bolus 500 mL (0 mLs Intravenous Stopped 09/16/22 2226)  furosemide (LASIX) injection 80 mg (80 mg Intravenous Given 09/16/22 2238)  oxyCODONE-acetaminophen (PERCOCET/ROXICET) 5-325 MG per tablet 2 tablet (2 tablets Oral Given 09/17/22 0014)  ondansetron (ZOFRAN) injection 4 mg (4 mg Intravenous Given 09/17/22 0013)  albuterol (PROVENTIL) (2.5 MG/3ML) 0.083% nebulizer solution 10 mg (10 mg Nebulization Given 09/17/22 0958)  magnesium sulfate IVPB 2 g 50 mL (0 g Intravenous Stopped 09/17/22 1208)  furosemide (LASIX) injection 80 mg (80 mg Intravenous Given 09/17/22 1037)    Mobility walks     Focused Assessments Pulmonary Assessment Handoff:  Lung sounds: Bilateral Breath Sounds: Expiratory wheezes O2 Device: (S) Bi-PAP O2 Flow Rate (L/min): 2 L/min    R Recommendations: See Admitting Provider Note  Report given to:   Additional Notes: Pt has been homeless, not been wearing her baseline O2, currently eating and been talking on the phone.

## 2022-09-17 NOTE — Significant Event (Signed)
Rapid Response Event Note   Reason for Call :  Respiratory distress, spO2 70s  Initial Focused Assessment:  Patient in bed A&O, slightly diaphoretic, mildly labored breathing. Lung sounds tight, heart tones fast. C/o mid chest pain radiating down left arm 8/10.   VS 95/56 (68) HR 112 RR 28  O2 95% 4L [place]  Interventions:  EKG NS bolus Neb tx Nitro 0.4 SL x1; CP still 8/10 2nd nitro; CP 6/10 Attempt Bipap, pt immediately took off  Plan of Care:  Encourage use of Bipap. Call back for further needs.  Event Summary:  MD Notified: M. Arrien MD Call Time: 1818 Arrival Time: 1821 End Time: 1850  Truddie Crumble, RN

## 2022-09-17 NOTE — Assessment & Plan Note (Addendum)
Patient tolerated well diuresis, at the time of her discharge her serum cr id 0,86 with K at 4,3 and serum bicarbonate at 26, Na 136.   Continue with spironolactone and empagliflozin  -Continue diuresis with bumetanide.  Follow up renal function as outpatient.

## 2022-09-18 ENCOUNTER — Inpatient Hospital Stay (HOSPITAL_COMMUNITY): Payer: BLUE CROSS/BLUE SHIELD

## 2022-09-18 DIAGNOSIS — J441 Chronic obstructive pulmonary disease with (acute) exacerbation: Secondary | ICD-10-CM | POA: Diagnosis not present

## 2022-09-18 LAB — POCT I-STAT 7, (LYTES, BLD GAS, ICA,H+H)
Acid-Base Excess: 1 mmol/L (ref 0.0–2.0)
Acid-base deficit: 1 mmol/L (ref 0.0–2.0)
Bicarbonate: 27.8 mmol/L (ref 20.0–28.0)
Bicarbonate: 24.7 mmol/L (ref 20.0–28.0)
Calcium, Ion: 1.18 mmol/L (ref 1.15–1.40)
Calcium, Ion: 1.13 mmol/L — ABNORMAL LOW (ref 1.15–1.40)
HCT: 36 % (ref 36.0–46.0)
HCT: 32 % — ABNORMAL LOW (ref 36.0–46.0)
Hemoglobin: 12.2 g/dL (ref 12.0–15.0)
Hemoglobin: 10.9 g/dL — ABNORMAL LOW (ref 12.0–15.0)
O2 Saturation: 94 %
O2 Saturation: 99 %
Patient temperature: 99.4
Patient temperature: 99
Potassium: 4.8 mmol/L (ref 3.5–5.1)
Potassium: 4.6 mmol/L (ref 3.5–5.1)
Sodium: 136 mmol/L (ref 135–145)
Sodium: 137 mmol/L (ref 135–145)
TCO2: 30 mmol/L (ref 22–32)
TCO2: 26 mmol/L (ref 22–32)
pCO2 arterial: 65.3 mmHg (ref 32–48)
pCO2 arterial: 36.1 mmHg (ref 32–48)
pH, Arterial: 7.239 — ABNORMAL LOW (ref 7.35–7.45)
pH, Arterial: 7.445 (ref 7.35–7.45)
pO2, Arterial: 89 mmHg (ref 83–108)
pO2, Arterial: 116 mmHg — ABNORMAL HIGH (ref 83–108)

## 2022-09-18 LAB — BASIC METABOLIC PANEL
Anion gap: 7 (ref 5–15)
BUN: 32 mg/dL — ABNORMAL HIGH (ref 6–20)
CO2: 28 mmol/L (ref 22–32)
Calcium: 7.8 mg/dL — ABNORMAL LOW (ref 8.9–10.3)
Chloride: 100 mmol/L (ref 98–111)
Creatinine, Ser: 1.58 mg/dL — ABNORMAL HIGH (ref 0.44–1.00)
GFR, Estimated: 39 mL/min — ABNORMAL LOW (ref >60)
Glucose, Bld: 92 mg/dL (ref 70–99)
Potassium: 3.3 mmol/L — ABNORMAL LOW (ref 3.5–5.1)
Sodium: 135 mmol/L (ref 135–145)

## 2022-09-18 LAB — GLUCOSE, CAPILLARY
Glucose-Capillary: 106 mg/dL — ABNORMAL HIGH (ref 70–99)
Glucose-Capillary: 188 mg/dL — ABNORMAL HIGH (ref 70–99)
Glucose-Capillary: 185 mg/dL — ABNORMAL HIGH (ref 70–99)
Glucose-Capillary: 229 mg/dL — ABNORMAL HIGH (ref 70–99)
Glucose-Capillary: 154 mg/dL — ABNORMAL HIGH (ref 70–99)
Glucose-Capillary: 129 mg/dL — ABNORMAL HIGH (ref 70–99)
Glucose-Capillary: 132 mg/dL — ABNORMAL HIGH (ref 70–99)

## 2022-09-18 LAB — MRSA NEXT GEN BY PCR, NASAL: MRSA by PCR Next Gen: NOT DETECTED

## 2022-09-18 LAB — MAGNESIUM
Magnesium: 2.1 mg/dL (ref 1.7–2.4)
Magnesium: 2.2 mg/dL (ref 1.7–2.4)

## 2022-09-18 LAB — PHOSPHORUS: Phosphorus: 3.4 mg/dL (ref 2.5–4.6)

## 2022-09-18 MED ORDER — FOLIC ACID 1 MG PO TABS
1.0000 mg | ORAL_TABLET | Freq: Every day | ORAL | Status: DC
  Administered 2022-09-18 – 2022-09-21 (×4): 1 mg
  Filled 2022-09-18 (×4): qty 1

## 2022-09-18 MED ORDER — IPRATROPIUM-ALBUTEROL 0.5-2.5 (3) MG/3ML IN SOLN
RESPIRATORY_TRACT | Status: AC
  Filled 2022-09-18: qty 3

## 2022-09-18 MED ORDER — LACTATED RINGERS IV BOLUS: 500.0000 mL | Freq: Once | INTRAVENOUS | Status: DC

## 2022-09-18 MED ORDER — FENTANYL 2500MCG IN NS 250ML (10MCG/ML) PREMIX INFUSION
INTRAVENOUS | Status: AC
  Administered 2022-09-18: 100 ug/h via INTRAVENOUS
  Filled 2022-09-18: qty 250

## 2022-09-18 MED ORDER — MIDAZOLAM-SODIUM CHLORIDE 100-0.9 MG/100ML-% IV SOLN
0.5000 mg/h | INTRAVENOUS | Status: DC
  Administered 2022-09-18: 1 mg/h via INTRAVENOUS
  Administered 2022-09-19: 2 mg/h via INTRAVENOUS
  Filled 2022-09-18 (×2): qty 100

## 2022-09-18 MED ORDER — FENTANYL CITRATE PF 50 MCG/ML IJ SOSY: 100.0000 ug | PREFILLED_SYRINGE | INTRAMUSCULAR | Status: AC

## 2022-09-18 MED ORDER — METHYLPREDNISOLONE SODIUM SUCC 125 MG IJ SOLR
INTRAMUSCULAR | Status: AC
  Administered 2022-09-18: 125 mg via INTRAVENOUS
  Filled 2022-09-18: qty 2

## 2022-09-18 MED ORDER — THIAMINE HCL 100 MG/ML IJ SOLN
100.0000 mg | Freq: Every day | INTRAMUSCULAR | Status: DC
  Administered 2022-09-18 – 2022-09-21 (×4): 100 mg via INTRAVENOUS
  Filled 2022-09-18 (×4): qty 2

## 2022-09-18 MED ORDER — ARFORMOTEROL TARTRATE 15 MCG/2ML IN NEBU
15.0000 ug | INHALATION_SOLUTION | Freq: Two times a day (BID) | RESPIRATORY_TRACT | Status: DC
  Administered 2022-09-18 – 2022-09-26 (×16): 15 ug via RESPIRATORY_TRACT
  Filled 2022-09-18 (×22): qty 2

## 2022-09-18 MED ORDER — FENTANYL BOLUS VIA INFUSION
25.0000 ug | INTRAVENOUS | Status: DC | PRN
  Administered 2022-09-18: 50 ug via INTRAVENOUS
  Administered 2022-09-18 – 2022-09-19 (×5): 100 ug via INTRAVENOUS

## 2022-09-18 MED ORDER — SUCCINYLCHOLINE CHLORIDE 200 MG/10ML IV SOSY
PREFILLED_SYRINGE | INTRAVENOUS | Status: AC
  Filled 2022-09-18: qty 10

## 2022-09-18 MED ORDER — SODIUM CHLORIDE 0.9 % IV SOLN
250.0000 mL | INTRAVENOUS | Status: DC
  Administered 2022-09-18: 250 mL via INTRAVENOUS

## 2022-09-18 MED ORDER — FENTANYL 2500MCG IN NS 250ML (10MCG/ML) PREMIX INFUSION
50.0000 ug/h | INTRAVENOUS | Status: DC
  Administered 2022-09-18: 150 ug/h via INTRAVENOUS
  Filled 2022-09-18: qty 250

## 2022-09-18 MED ORDER — ACETAMINOPHEN 650 MG RE SUPP: 650.0000 mg | Freq: Four times a day (QID) | RECTAL | Status: DC | PRN

## 2022-09-18 MED ORDER — ASPIRIN 81 MG PO CHEW
81.0000 mg | CHEWABLE_TABLET | Freq: Every day | ORAL | Status: DC
  Administered 2022-09-18 – 2022-09-21 (×4): 81 mg
  Filled 2022-09-18 (×4): qty 1

## 2022-09-18 MED ORDER — FAMOTIDINE 20 MG PO TABS
20.0000 mg | ORAL_TABLET | Freq: Two times a day (BID) | ORAL | Status: DC
  Administered 2022-09-18 – 2022-09-21 (×8): 20 mg
  Filled 2022-09-18 (×8): qty 1

## 2022-09-18 MED ORDER — ADULT MULTIVITAMIN W/MINERALS CH
1.0000 | ORAL_TABLET | Freq: Every day | ORAL | Status: DC
  Administered 2022-09-18 – 2022-09-21 (×4): 1
  Filled 2022-09-18 (×4): qty 1

## 2022-09-18 MED ORDER — FENTANYL CITRATE PF 50 MCG/ML IJ SOSY
PREFILLED_SYRINGE | INTRAMUSCULAR | Status: AC
  Administered 2022-09-18: 100 ug via INTRAVENOUS
  Filled 2022-09-18: qty 2

## 2022-09-18 MED ORDER — ETOMIDATE 2 MG/ML IV SOLN: 20.0000 mg | INTRAVENOUS | Status: AC

## 2022-09-18 MED ORDER — ROSUVASTATIN CALCIUM 20 MG PO TABS
20.0000 mg | ORAL_TABLET | Freq: Every day | ORAL | Status: DC
  Administered 2022-09-18 – 2022-09-21 (×4): 20 mg
  Filled 2022-09-18 (×4): qty 1

## 2022-09-18 MED ORDER — METHYLPREDNISOLONE SODIUM SUCC 125 MG IJ SOLR
60.0000 mg | Freq: Two times a day (BID) | INTRAMUSCULAR | Status: DC
  Administered 2022-09-18 – 2022-09-19 (×3): 60 mg via INTRAVENOUS
  Filled 2022-09-18 (×3): qty 2

## 2022-09-18 MED ORDER — GLUCERNA 1.5 CAL PO LIQD
1000.0000 mL | ORAL | Status: DC
  Administered 2022-09-18 – 2022-09-20 (×3): 1000 mL
  Filled 2022-09-18 (×5): qty 1000

## 2022-09-18 MED ORDER — ROCURONIUM BROMIDE 50 MG/5ML IV SOLN: 100.0000 mg | INTRAVENOUS | Status: AC

## 2022-09-18 MED ORDER — POTASSIUM CHLORIDE 10 MEQ/100ML IV SOLN
10.0000 meq | INTRAVENOUS | Status: AC
  Administered 2022-09-18 (×2): 10 meq via INTRAVENOUS
  Filled 2022-09-18 (×2): qty 100

## 2022-09-18 MED ORDER — ETOMIDATE 2 MG/ML IV SOLN
INTRAVENOUS | Status: AC
  Administered 2022-09-18: 20 mg via INTRAVENOUS
  Filled 2022-09-18: qty 20

## 2022-09-18 MED ORDER — MIDAZOLAM HCL 2 MG/2ML IJ SOLN
INTRAMUSCULAR | Status: AC
  Administered 2022-09-18: 2 mg via INTRAVENOUS
  Filled 2022-09-18: qty 2

## 2022-09-18 MED ORDER — METHYLPREDNISOLONE SODIUM SUCC 125 MG IJ SOLR: 125.0000 mg | Freq: Once | INTRAMUSCULAR | Status: AC

## 2022-09-18 MED ORDER — NOREPINEPHRINE 4 MG/250ML-% IV SOLN: 0.0000 ug/min | INTRAVENOUS | Status: DC

## 2022-09-18 MED ORDER — KETAMINE HCL 50 MG/5ML IJ SOSY
PREFILLED_SYRINGE | INTRAMUSCULAR | Status: AC
  Filled 2022-09-18: qty 10

## 2022-09-18 MED ORDER — REVEFENACIN 175 MCG/3ML IN SOLN
175.0000 ug | Freq: Every day | RESPIRATORY_TRACT | Status: DC
  Administered 2022-09-18 – 2022-09-30 (×10): 175 ug via RESPIRATORY_TRACT
  Filled 2022-09-18 (×14): qty 3

## 2022-09-18 MED ORDER — ORAL CARE MOUTH RINSE: 15.0000 mL | OROMUCOSAL | Status: DC

## 2022-09-18 MED ORDER — BUDESONIDE 0.25 MG/2ML IN SUSP
0.2500 mg | Freq: Two times a day (BID) | RESPIRATORY_TRACT | Status: DC
  Administered 2022-09-18 – 2022-09-26 (×17): 0.25 mg via RESPIRATORY_TRACT
  Filled 2022-09-18 (×20): qty 2

## 2022-09-18 MED ORDER — FENTANYL BOLUS VIA INFUSION: 50.0000 ug | INTRAVENOUS | Status: DC | PRN

## 2022-09-18 MED ORDER — SODIUM CHLORIDE 0.9 % IV SOLN
100.0000 mg | Freq: Two times a day (BID) | INTRAVENOUS | Status: DC
  Administered 2022-09-18 (×2): 100 mg via INTRAVENOUS
  Filled 2022-09-18 (×3): qty 100

## 2022-09-18 MED ORDER — RISPERIDONE 2 MG PO TABS
2.0000 mg | ORAL_TABLET | Freq: Every day | ORAL | Status: DC
  Administered 2022-09-18 – 2022-09-21 (×4): 2 mg
  Filled 2022-09-18 (×4): qty 1

## 2022-09-18 MED ORDER — CLONAZEPAM 1 MG PO TABS: 1.0000 mg | ORAL_TABLET | Freq: Three times a day (TID) | ORAL | Status: DC | PRN

## 2022-09-18 MED ORDER — POTASSIUM CHLORIDE 10 MEQ/100ML IV SOLN
10.0000 meq | INTRAVENOUS | Status: AC
  Administered 2022-09-18 (×3): 10 meq via INTRAVENOUS
  Filled 2022-09-18 (×3): qty 100

## 2022-09-18 MED ORDER — ENOXAPARIN SODIUM 40 MG/0.4ML IJ SOSY
40.0000 mg | PREFILLED_SYRINGE | INTRAMUSCULAR | Status: DC
  Administered 2022-09-18 – 2022-09-30 (×13): 40 mg via SUBCUTANEOUS
  Filled 2022-09-18 (×13): qty 0.4

## 2022-09-18 MED ORDER — DOCUSATE SODIUM 50 MG/5ML PO LIQD
100.0000 mg | Freq: Two times a day (BID) | ORAL | Status: DC
  Administered 2022-09-18 – 2022-09-21 (×7): 100 mg
  Filled 2022-09-18 (×7): qty 10

## 2022-09-18 MED ORDER — CHLORHEXIDINE GLUCONATE CLOTH 2 % EX PADS
6.0000 | MEDICATED_PAD | Freq: Every day | CUTANEOUS | Status: DC
  Administered 2022-09-18 – 2022-09-25 (×7): 6 via TOPICAL

## 2022-09-18 MED ORDER — ORAL CARE MOUTH RINSE
15.0000 mL | OROMUCOSAL | Status: DC
  Administered 2022-09-18 – 2022-09-21 (×40): 15 mL via OROMUCOSAL

## 2022-09-18 MED ORDER — MIDAZOLAM HCL 2 MG/2ML IJ SOLN
1.0000 mg | INTRAMUSCULAR | Status: DC | PRN
  Administered 2022-09-18 – 2022-09-20 (×6): 2 mg via INTRAVENOUS
  Administered 2022-09-20 (×2): 1 mg via INTRAVENOUS
  Administered 2022-09-20: 2 mg via INTRAVENOUS
  Administered 2022-09-20: 1 mg via INTRAVENOUS

## 2022-09-18 MED ORDER — POLYETHYLENE GLYCOL 3350 17 G PO PACK
17.0000 g | PACK | Freq: Every day | ORAL | Status: DC
  Administered 2022-09-18 – 2022-09-20 (×3): 17 g
  Filled 2022-09-18 (×4): qty 1

## 2022-09-18 MED ORDER — LACTATED RINGERS IV BOLUS
250.0000 mL | Freq: Once | INTRAVENOUS | Status: AC
  Administered 2022-09-18: 250 mL via INTRAVENOUS

## 2022-09-18 MED ORDER — FENTANYL CITRATE PF 50 MCG/ML IJ SOSY
50.0000 ug | PREFILLED_SYRINGE | Freq: Once | INTRAMUSCULAR | Status: DC
Start: 2022-09-18 — End: 2022-09-18

## 2022-09-18 MED ORDER — NOREPINEPHRINE 4 MG/250ML-% IV SOLN
2.0000 ug/min | INTRAVENOUS | Status: DC
  Administered 2022-09-18: 2 ug/min via INTRAVENOUS
  Administered 2022-09-19: 6 ug/min via INTRAVENOUS
  Administered 2022-09-19: 7 ug/min via INTRAVENOUS
  Filled 2022-09-18 (×3): qty 250

## 2022-09-18 MED ORDER — METHIMAZOLE 5 MG PO TABS
5.0000 mg | ORAL_TABLET | Freq: Every day | ORAL | Status: DC
  Administered 2022-09-18 – 2022-09-19 (×2): 5 mg
  Filled 2022-09-18 (×3): qty 1

## 2022-09-18 MED ORDER — RISPERIDONE 1 MG PO TABS
1.0000 mg | ORAL_TABLET | Freq: Every day | ORAL | Status: DC
  Administered 2022-09-18 – 2022-09-21 (×4): 1 mg
  Filled 2022-09-18 (×4): qty 1

## 2022-09-18 MED ORDER — MIDAZOLAM HCL 2 MG/2ML IJ SOLN: 2.0000 mg | INTRAMUSCULAR | Status: AC

## 2022-09-18 MED ORDER — FENTANYL CITRATE PF 50 MCG/ML IJ SOSY: 100.0000 ug | PREFILLED_SYRINGE | INTRAMUSCULAR | Status: DC

## 2022-09-18 MED ORDER — SODIUM CHLORIDE 0.9 % IV SOLN
2.0000 g | INTRAVENOUS | Status: DC
  Administered 2022-09-18 – 2022-09-19 (×2): 2 g via INTRAVENOUS
  Filled 2022-09-18 (×2): qty 20

## 2022-09-18 MED ORDER — ACETAMINOPHEN 325 MG PO TABS: 650.0000 mg | ORAL_TABLET | Freq: Four times a day (QID) | ORAL | Status: DC | PRN

## 2022-09-18 MED ORDER — ORAL CARE MOUTH RINSE: 15.0000 mL | OROMUCOSAL | Status: DC | PRN

## 2022-09-18 MED ORDER — ROCURONIUM BROMIDE 10 MG/ML (PF) SYRINGE
PREFILLED_SYRINGE | INTRAVENOUS | Status: AC
  Administered 2022-09-18: 100 mg via INTRAVENOUS
  Filled 2022-09-18: qty 10

## 2022-09-18 MED ORDER — PROPOFOL 1000 MG/100ML IV EMUL
0.0000 ug/kg/min | INTRAVENOUS | Status: DC
  Administered 2022-09-18: 10 ug/kg/min via INTRAVENOUS
  Administered 2022-09-18: 30 ug/kg/min via INTRAVENOUS
  Administered 2022-09-18: 50 ug/kg/min via INTRAVENOUS
  Administered 2022-09-19: 40 ug/kg/min via INTRAVENOUS
  Administered 2022-09-19: 30 ug/kg/min via INTRAVENOUS
  Administered 2022-09-20: 15 ug/kg/min via INTRAVENOUS
  Administered 2022-09-20: 5 ug/kg/min via INTRAVENOUS
  Administered 2022-09-21: 20 ug/kg/min via INTRAVENOUS
  Filled 2022-09-18 (×8): qty 100

## 2022-09-18 MED ORDER — LORAZEPAM 2 MG/ML IJ SOLN: 1.0000 mg | Freq: Once | INTRAMUSCULAR | Status: DC

## 2022-09-18 MED ORDER — INSULIN ASPART 100 UNIT/ML IJ SOLN
0.0000 [IU] | INTRAMUSCULAR | Status: DC
  Administered 2022-09-18: 5 [IU] via SUBCUTANEOUS
  Administered 2022-09-18: 2 [IU] via SUBCUTANEOUS
  Administered 2022-09-18 (×2): 3 [IU] via SUBCUTANEOUS
  Administered 2022-09-18: 2 [IU] via SUBCUTANEOUS
  Administered 2022-09-19 (×3): 5 [IU] via SUBCUTANEOUS
  Administered 2022-09-19 (×2): 3 [IU] via SUBCUTANEOUS
  Administered 2022-09-19: 5 [IU] via SUBCUTANEOUS
  Administered 2022-09-20: 8 [IU] via SUBCUTANEOUS
  Administered 2022-09-20: 2 [IU] via SUBCUTANEOUS
  Administered 2022-09-20: 11 [IU] via SUBCUTANEOUS
  Administered 2022-09-20: 5 [IU] via SUBCUTANEOUS
  Administered 2022-09-21: 3 [IU] via SUBCUTANEOUS
  Administered 2022-09-21 (×4): 5 [IU] via SUBCUTANEOUS

## 2022-09-18 MED ORDER — SODIUM CHLORIDE 0.9% FLUSH
10.0000 mL | Freq: Three times a day (TID) | INTRAVENOUS | Status: DC
  Administered 2022-09-18 – 2022-09-21 (×7): 10 mL via INTRAPLEURAL
  Administered 2022-09-22: 20 mL via INTRAPLEURAL
  Administered 2022-09-22 – 2022-09-24 (×7): 10 mL via INTRAPLEURAL

## 2022-09-18 MED ORDER — FENTANYL 2500MCG IN NS 250ML (10MCG/ML) PREMIX INFUSION: 0.0000 ug/h | INTRAVENOUS | Status: DC

## 2022-09-18 MED ORDER — INSULIN GLARGINE-YFGN 100 UNIT/ML ~~LOC~~ SOLN
10.0000 [IU] | Freq: Two times a day (BID) | SUBCUTANEOUS | Status: DC
  Administered 2022-09-18 – 2022-09-19 (×3): 10 [IU] via SUBCUTANEOUS
  Filled 2022-09-18 (×4): qty 0.1

## 2022-09-18 MED ORDER — PHENYLEPHRINE 80 MCG/ML (10ML) SYRINGE FOR IV PUSH (FOR BLOOD PRESSURE SUPPORT)
PREFILLED_SYRINGE | INTRAVENOUS | Status: AC
  Filled 2022-09-18: qty 10

## 2022-09-18 MED ORDER — CLONAZEPAM 0.5 MG PO TABS
1.0000 mg | ORAL_TABLET | Freq: Once | ORAL | Status: AC
  Administered 2022-09-18: 1 mg via ORAL
  Filled 2022-09-18: qty 2

## 2022-09-18 NOTE — Consult Note (Addendum)
NAME:  [name] [name], MRN:  [id number], DOB:  February 12, 1971, LOS: 1 ADMISSION DATE:  09/16/2022, CONSULTATION DATE: 14 2024 REFERRING MD: [facility], CHIEF COMPLAINT: Acute respiratory failure  History of Present Illness:  52 year old female with extensive past medical history is well-documented below pulmonary critical care called to bedside for acute exacerbation of COPD and acute respiratory failure.  She was transferred to intensive care unit urgently intubated.  Chest x-ray shows proper placement of NG tube and endotracheal tube.  She will be maintained in the intensive care unit until stabilized.  Pertinent  Medical History   Past Medical History:  Diagnosis Date   Anxiety disorder    Asthma    Bipolar affective disorder    Blood transfusion without reported diagnosis    CHF (congestive heart failure)    COPD (chronic obstructive pulmonary disease)    Depression    Diabetes mellitus    Migraine    Schizophrenia      Significant Hospital Events: Including procedures, antibiotic start and stop dates in addition to other pertinent events     Interim History / Subjective:  09/18/2018 transfer intensive care unit intubated  Objective   Blood pressure (!) 148/92, pulse (!) 148, temperature 99.8 F (37.7 C), temperature source Oral, resp. rate (!) 32, height 5\' 6"  (1.676 m), weight 83.6 kg, SpO2 92 %.    Vent Mode: BIPAP;PSV FiO2 (%):  [45 %-60 %] 60 % PEEP:  [5 cmH20] 5 cmH20 Pressure Support:  [10 cmH20] 10 cmH20   Intake/Output Summary (Last 24 hours) at 09/18/2022 0756 Last data filed at 09/18/2022 0530 Gross per 24 hour  Intake 1290 ml  Output 1900 ml  Net -610 ml   Filed Weights   09/16/22 2115 09/18/22 0538 09/18/22 0752  Weight: 83.7 kg 84.7 kg 83.6 kg    Examination: General: Obese female currently on noninvasive mechanical respiratory support with acute distress HENT: Oropharynx is unremarkable no JVD is appreciated Lungs: Poor air movement Cardiovascular:  Heart sounds are regular Abdomen: Abdomen obese soft positive bowel sounds Extremities: 1+ edema Neuro: Poorly responsive amber urine GU: Amber urine  Resolved Hospital Problem list     Assessment & Plan:  Acute respiratory failure in the setting of severe COPD with suspected drug interaction from illicit material.  14 2024 transferred to intensive care unit for urgent intubation Vent bundle Bronchodilators Systemic steroids No antibiotics at this time panculture   Congestive heart failure Ef 20% Careful with fluid resuscitation  Diabetes type 2 CBG (last 3)  Recent Labs    09/17/22 2111 09/18/22 0611 09/18/22 0750  GLUCAP 153* 106* 188*   Sliding-scale insulin protocol  Tobacco abuse Smoking cessation  Hypothyroidism Monitor TSH   Best Practice (right click and "Reselect all SmartList Selections" daily)   Diet/type: NPO DVT prophylaxis: LMWH GI prophylaxis: PPI Lines: N/A Foley:  N/A Code Status:  full code Last date of multidisciplinary goals of care discussion [tbd]  Labs   CBC: Recent Labs  Lab 09/16/22 2120 09/17/22 0313 09/17/22 0333  WBC 6.0 7.0  --   NEUTROABS 3.6 6.5  --   HGB 12.2 11.1* 11.9*  HCT 38.0 36.7 35.0*  MCV 89.0 91.8  --   PLT 195 172  --     Basic Metabolic Panel: Recent Labs  Lab 09/16/22 2120 09/16/22 2255 09/17/22 0313 09/17/22 0333 09/18/22 0003  NA 139  --  134* 134* 135  K 4.2  --  3.9 3.9 3.3*  CL 98  --  94*  --  100  CO2 28  --  26  --  28  GLUCOSE 270*  --  506*  --  92  BUN 20  --  20  --  32*  CREATININE 1.39*  --  1.47*  --  1.58*  CALCIUM 8.5*  --  8.1*  --  7.8*  MG  --  1.7 1.8  --  2.1  PHOS  --   --  4.1  --   --    GFR: Estimated Creatinine Clearance: 44.9 mL/min (A) (by C-G formula based on SCr of 1.58 mg/dL (H)). Recent Labs  Lab 09/16/22 2120 09/17/22 0313  PROCALCITON  --  <0.10  WBC 6.0 7.0    Liver Function Tests: Recent Labs  Lab 09/16/22 2120 09/17/22 0313  AST 19 21   ALT 17 16  ALKPHOS 90 93  BILITOT 0.7 0.4  PROT 6.2* 5.9*  ALBUMIN 3.5 3.3*   No results for input(s): "LIPASE", "AMYLASE" in the last 168 hours. No results for input(s): "AMMONIA" in the last 168 hours.  ABG    Component Value Date/Time   HCO3 28.1 (H) 09/17/2022 0333   TCO2 29 09/17/2022 0333   ACIDBASEDEF 0.7 04/25/2020 0845   O2SAT 97 09/17/2022 0333     Coagulation Profile: No results for input(s): "INR", "PROTIME" in the last 168 hours.  Cardiac Enzymes: No results for input(s): "CKTOTAL", "CKMB", "CKMBINDEX", "TROPONINI" in the last 168 hours.  HbA1C: Hgb A1c MFr Bld  Date/Time Value Ref Range Status  08/27/2022 03:41 AM 11.0 (H) 4.8 - 5.6 % Final    Comment:    (NOTE)         Prediabetes: 5.7 - 6.4         Diabetes: >6.4         Glycemic control for adults with diabetes: <7.0   05/16/2022 01:10 AM 8.1 (H) 4.8 - 5.6 % Final    Comment:    (NOTE)         Prediabetes: 5.7 - 6.4         Diabetes: >6.4         Glycemic control for adults with diabetes: <7.0     CBG: Recent Labs  Lab 09/17/22 1308 09/17/22 1701 09/17/22 1820 09/17/22 2111 09/18/22 0611  GLUCAP 431* 391* 392* 153* 106*    Review of Systems:   na  Past Medical History:  She,  has a past medical history of Anxiety disorder, Asthma, Bipolar affective disorder, Blood transfusion without reported diagnosis, CHF (congestive heart failure), COPD (chronic obstructive pulmonary disease), Depression, Diabetes mellitus, Migraine, and Schizophrenia.   Surgical History:   Past Surgical History:  Procedure Laterality Date   KNEE ARTHROSCOPY     x 2   KNEE SURGERY Left    Incision made and knee cleaned out   TUBAL LIGATION       Social History:   reports that she has been smoking cigarettes. She has a 29.00 pack-year smoking history. She has never used smokeless tobacco. She reports that she does not currently use alcohol. She reports that she does not use drugs.   Family History:  Her  family history includes Bipolar disorder in her mother; Diabetes in her father; Hypertension in her father.   Allergies Allergies  Allergen Reactions   Tramadol Nausea Only and Rash   Benadryl [Diphenhydramine]     Decreases motor skills and increases anxiety   Wellbutrin [Bupropion] Other (See Comments)    rage  Nicotine Rash    Patient states she is allergic to the Nicotine patch and they cause her to break out in a rash.  She states she can smoke cigarettes.     Home Medications  Prior to Admission medications   Medication Sig Start Date End Date Taking? Authorizing Provider  acetaminophen (TYLENOL) 325 MG tablet Take 1 tablet (325 mg total) by mouth every 6 (six) hours as needed for headache or mild pain. 09/02/22  Yes Wouk, Wilfred Curtis, MD  ASPIRIN LOW DOSE 81 MG tablet Take 1 tablet (81 mg total) by mouth daily. 08/23/22  Yes Gherghe, Daylene Katayama, MD  bumetanide (BUMEX) 2 MG tablet Take 1 tablet (2 mg total) by mouth 2 (two) times daily. 08/29/22 09/28/22 Yes Arrien, York Ram, MD  insulin glargine, 1 Unit Dial, (TOUJEO SOLOSTAR) 300 UNIT/ML Solostar Pen Inject 24 Units into the skin at bedtime. 09/02/22 10/02/22 Yes Wouk, Wilfred Curtis, MD  isosorbide mononitrate (IMDUR) 60 MG 24 hr tablet Take 1 tablet (60 mg total) by mouth daily. 08/23/22 09/22/22 Yes Gherghe, Daylene Katayama, MD  metoprolol succinate (TOPROL-XL) 25 MG 24 hr tablet Take 1 tablet (25 mg total) by mouth daily. 08/23/22 09/22/22 Yes Gherghe, Daylene Katayama, MD  nitroGLYCERIN (NITROSTAT) 0.4 MG SL tablet Place 1 tablet (0.4 mg total) under the tongue every 5 (five) minutes as needed for chest pain. 08/23/22  Yes Gherghe, Daylene Katayama, MD  oxyCODONE-acetaminophen (PERCOCET) 10-325 MG tablet Take 1 tablet by mouth every 6 (six) hours as needed for pain. 09/02/22  Yes Wouk, Wilfred Curtis, MD  potassium chloride SA (KLOR-CON M) 20 MEQ tablet Take 1 tablet (20 mEq total) by mouth daily. Take with bumetanide. 08/29/22  Yes Arrien, York Ram, MD   risperiDONE (RISPERDAL) 2 MG tablet Take one-half tablet in the morning and one tablet in the evening. 08/23/22  Yes Gherghe, Daylene Katayama, MD  rosuvastatin (CRESTOR) 20 MG tablet Take 1 tablet (20 mg total) by mouth daily. 08/30/22 09/29/22 Yes Arrien, York Ram, MD  blood glucose meter kit and supplies Dispense based on patient and insurance preference. Use up to four times daily as directed. (FOR ICD-10 E10.9, E11.9). 01/06/18   Rodolph Bong, MD  Insulin Pen Needle 32G X 4 MM MISC Inject 1 each into the skin in the morning. 09/02/22   Wouk, Wilfred Curtis, MD  methimazole (TAPAZOLE) 5 MG tablet Take 1 tablet (5 mg total) by mouth daily for 21 days. 08/29/22 09/19/22  Arrien, York Ram, MD  benztropine (COGENTIN) 1 MG tablet Take 1 tablet (1 mg total) by mouth 2 (two) times daily. 12/23/18 06/04/20  Malvin Johns, MD  insulin aspart (NOVOLOG) 100 UNIT/ML injection Inject 0-9 Units into the skin 3 (three) times daily with meals. Patient not taking: Reported on 06/03/2020 12/16/18 06/04/20  Lanae Boast, MD     Critical care time: 34 min    Brett Canales Jailon Schaible ACNP Acute Care Nurse Practitioner [facility]/Critical Care Please consult Amion 09/18/2022, 7:56 AM

## 2022-09-18 NOTE — Plan of Care (Signed)
Problem: Education: Goal: Understanding of cardiac disease, CV risk reduction, and recovery process will improve Outcome: Not Progressing Goal: Individualized Educational Video(s) Outcome: Not Progressing   Problem: Activity: Goal: Ability to tolerate increased activity will improve Outcome: Not Progressing   Problem: Cardiac: Goal: Ability to achieve and maintain adequate cardiovascular perfusion will improve Outcome: Not Progressing   Problem: Health Behavior/Discharge Planning: Goal: Ability to safely manage health-related needs after discharge will improve Outcome: Not Progressing   Problem: Education: Goal: Ability to describe self-care measures that may prevent or decrease complications (Diabetes Survival Skills Education) will improve Outcome: Not Progressing Goal: Individualized Educational Video(s) Outcome: Not Progressing   Problem: Cardiac: Goal: Ability to maintain an adequate cardiac output will improve Outcome: Not Progressing   Problem: Health Behavior/Discharge Planning: Goal: Ability to identify and utilize available resources and services will improve Outcome: Not Progressing Goal: Ability to manage health-related needs will improve Outcome: Not Progressing   Problem: Fluid Volume: Goal: Ability to achieve a balanced intake and output will improve Outcome: Not Progressing   Problem: Metabolic: Goal: Ability to maintain appropriate glucose levels will improve Outcome: Not Progressing   Problem: Nutritional: Goal: Maintenance of adequate nutrition will improve Outcome: Not Progressing Goal: Maintenance of adequate weight for body size and type will improve Outcome: Not Progressing   Problem: Respiratory: Goal: Will regain and/or maintain adequate ventilation Outcome: Not Progressing   Problem: Urinary Elimination: Goal: Ability to achieve and maintain adequate renal perfusion and functioning will improve Outcome: Not Progressing   Problem:  Education: Goal: Ability to describe self-care measures that may prevent or decrease complications (Diabetes Survival Skills Education) will improve Outcome: Not Progressing Goal: Individualized Educational Video(s) Outcome: Not Progressing   Problem: Coping: Goal: Ability to adjust to condition or change in health will improve Outcome: Not Progressing   Problem: Fluid Volume: Goal: Ability to maintain a balanced intake and output will improve Outcome: Not Progressing   Problem: Health Behavior/Discharge Planning: Goal: Ability to identify and utilize available resources and services will improve Outcome: Not Progressing Goal: Ability to manage health-related needs will improve Outcome: Not Progressing   Problem: Metabolic: Goal: Ability to maintain appropriate glucose levels will improve Outcome: Not Progressing   Problem: Nutritional: Goal: Maintenance of adequate nutrition will improve Outcome: Not Progressing Goal: Progress toward achieving an optimal weight will improve Outcome: Not Progressing   Problem: Skin Integrity: Goal: Risk for impaired skin integrity will decrease Outcome: Not Progressing   Problem: Tissue Perfusion: Goal: Adequacy of tissue perfusion will improve Outcome: Not Progressing   Problem: Education: Goal: Knowledge of General Education information will improve Description: Including pain rating scale, medication(s)/side effects and non-pharmacologic comfort measures Outcome: Not Progressing   Problem: Health Behavior/Discharge Planning: Goal: Ability to manage health-related needs will improve Outcome: Not Progressing   Problem: Clinical Measurements: Goal: Ability to maintain clinical measurements within normal limits will improve Outcome: Not Progressing Goal: Will remain free from infection Outcome: Not Progressing Goal: Diagnostic test results will improve Outcome: Not Progressing Goal: Respiratory complications will improve Outcome:  Not Progressing Goal: Cardiovascular complication will be avoided Outcome: Not Progressing   Problem: Activity: Goal: Risk for activity intolerance will decrease Outcome: Not Progressing   Problem: Nutrition: Goal: Adequate nutrition will be maintained Outcome: Not Progressing   Problem: Coping: Goal: Level of anxiety will decrease Outcome: Not Progressing   Problem: Elimination: Goal: Will not experience complications related to bowel motility Outcome: Not Progressing Goal: Will not experience complications related to urinary retention Outcome: Not Progressing  Problem: Pain Managment: Goal: General experience of comfort will improve Outcome: Not Progressing   Problem: Safety: Goal: Ability to remain free from injury will improve Outcome: Not Progressing   Problem: Skin Integrity: Goal: Risk for impaired skin integrity will decrease Outcome: Not Progressing   Problem: Activity: Goal: Ability to tolerate increased activity will improve Outcome: Not Progressing   Problem: Respiratory: Goal: Ability to maintain a clear airway and adequate ventilation will improve Outcome: Not Progressing   Problem: Role Relationship: Goal: Method of communication will improve Outcome: Not Progressing   Problem: Safety: Goal: Non-violent Restraint(s) 09/18/2022 0944 by Conley Simmonds, RN Outcome: Not Progressing 09/18/2022 0944 by Conley Simmonds, RN Outcome: Not Progressing

## 2022-09-18 NOTE — Procedures (Signed)
Insertion of Chest Tube Procedure Note  [name] [name]  [id number]  1971/02/05  Date:09/18/22  Time:5:40 PM    Provider Performing: Lorin Glass   Procedure: Pleural Catheter Insertion w/ Imaging Guidance (09233)  Indication(s) Pneumothorax  Consent Unable to obtain consent due to emergent nature of procedure.  Anesthesia Topical only with 1% lidocaine    Time Out Verified patient identification, verified procedure, site/side was marked, verified correct patient position, special equipment/implants available, medications/allergies/relevant history reviewed, required imaging and test results available.   Sterile Technique Maximal sterile technique including full sterile barrier drape, hand hygiene, sterile gown, sterile gloves, mask, hair covering, sterile ultrasound probe cover (if used).   Procedure Description Ultrasound used to identify appropriate pleural anatomy for placement and overlying skin marked. Area of placement cleaned and draped in sterile fashion.  A 14 French pigtail pleural catheter was placed into the right pleural space using Seldinger technique. Appropriate return of air was obtained.  The tube was connected to atrium and placed on -20 cm H2O wall suction.   Complications/Tolerance None; patient tolerated the procedure well. Chest X-ray is ordered to verify placement.   EBL Minimal  Specimen(s) none

## 2022-09-18 NOTE — Plan of Care (Signed)
Discussed with patient after Rapid Response event plan of care for the evening, pain management and wearing the Bipap with some teach back that needs to be reinforced.  Patient did mention that she is homeless and needs transition of care prior to leaving hospital for some living assistance.  Problem: Education: Goal: Understanding of cardiac disease, CV risk reduction, and recovery process will improve Outcome: Not Progressing   Problem: Health Behavior/Discharge Planning: Goal: Ability to safely manage health-related needs after discharge will improve Outcome: Not Progressing

## 2022-09-18 NOTE — Procedures (Signed)
Central Venous Catheter Insertion Procedure Note  Line type:  Non-tunneled central line catheter  Indications:  Need for vasopressors  Procedure Details:  Informed consent was obtained after explanation of the risks and benefits of the procedure, refer to the consent documentation.  Time-out was performed immediately prior to the procedure.  The right IJ vein was identified using bedside ultrasound. This area was prepped and draped in the usual sterile fashion. Maximum sterile technique was used including antiseptics, cap, gloves, gown, hand hygiene, mask, and sterile sheet. Local anesthesia with 1% lidocaine was applied subcutaneously then deep to the skin. The needle was then inserted into the r. IJ vein using ultrasound guidance. Ports were easily flushed and freely drawing venous blood. The catheter was secured with sutures. A sterile bandage was placed over the site.  Condition: The patient tolerated the procedure well and remains in the same condition as pre-procedure.  Complications: None  Plan: CXR was ordered and verified placement

## 2022-09-18 NOTE — Progress Notes (Signed)
   09/18/22 1300  Spiritual Encounters  Type of Visit Initial  Care provided to: Patient  Referral source Patient request  Reason for visit Advance directives  OnCall Visit No   Ch responded to request for AD education. Pt was intubated and not alert. Nurse said she will put another order for AD when pt is extubated. No follow-up needed at this time.

## 2022-09-18 NOTE — Procedures (Signed)
Intubation Procedure Note  [name] [name]  [id number]  08-28-1970  Date:09/18/22  Time:7:54 AM   Provider Performing:Jusiah Aguayo    Procedure: Intubation (31500)  Indication(s) Respiratory Failure  Consent Risks of the procedure as well as the alternatives and risks of each were explained to the patient and/or caregiver.  Consent for the procedure was obtained and is signed in the bedside chart   Anesthesia Etomidate, Versed, Fentanyl, and Rocuronium   Time Out Verified patient identification, verified procedure, site/side was marked, verified correct patient position, special equipment/implants available, medications/allergies/relevant history reviewed, required imaging and test results available.   Sterile Technique Usual hand hygeine, masks, and gloves were used   Procedure Description Patient positioned in bed supine.  Sedation given as noted above.  Patient was intubated with endotracheal tube using Glidescope.  View was Grade 2 only posterior commissure .  Number of attempts was 1.  Colorimetric CO2 detector was consistent with tracheal placement.   Complications/Tolerance None; patient tolerated the procedure well. Chest X-ray is ordered to verify placement.   EBL none   Specimen(s) None    Brett Canales Arrow Tomko ACNP Acute Care Nurse Practitioner [facility]/Critical Care Please consult Amion 09/18/2022, 7:55 AM

## 2022-09-18 NOTE — Progress Notes (Signed)
Initial Nutrition Assessment  DOCUMENTATION CODES:   Not applicable  INTERVENTION:  Initiate tube feeding via OGT once advanced per radiologist recommendation: Glucerna 1.5 at 50 ml/h (1200 ml per day) Start at 74mL/h and advance by 27mL q8h to goal Provides 1800 kcal, 99 gm protein, 911 ml free water daily MVI with minerals daily  NUTRITION DIAGNOSIS:  Inadequate oral intake related to inability to eat as evidenced by NPO status.  GOAL:  Patient will meet greater than or equal to 90% of their needs  MONITOR:  TF tolerance, I & O's, Vent status, Labs  REASON FOR ASSESSMENT:  Consult Enteral/tube feeding initiation and management  ASSESSMENT:   Pt with hx of COPD, CHF, DM type 2, CKD3, and schizophrenia presented to ED with SOB. Pt currently homeless and with tobacco use  Patient is currently intubated on ventilator support. No family at bedside to provide a nutrition hx. On exam, pt appears to be well nourished with no significant fat or muscle deficits.   Discussed with attending, ok to begin TF. OGT in place but per XR needs to be advanced 5 cm, discussed with RN.  Pt requiring pressor support x 1 but stable rate. Significant amount of propofol but slowing being titrated.   MV: 13 L/min Temp (24hrs), Avg:98.7 F (37.1 C), Min:97.9 F (36.6 C), Max:99.8 F (37.7 C)  Propofol: 20.1 ml/hr (531 kcal/d)   Intake/Output Summary (Last 24 hours) at 09/18/2022 1600 Last data filed at 09/18/2022 1500 Gross per 24 hour  Intake 1989.63 ml  Output 1600 ml  Net 389.63 ml  Net IO Since Admission: 1,019.63 mL [09/18/22 1600]  Nutritionally Relevant Medications: Scheduled Meds:  docusate  100 mg Per Tube BID   famotidine  20 mg Per Tube BID   folic acid  1 mg Per Tube Daily   insulin aspart  0-15 Units Subcutaneous Q4H   insulin glargine-yfgn  10 Units Subcutaneous BID   methimazole  5 mg Per Tube Daily   methylPREDNISolone  60 mg Intravenous Q12H   multivitamin with  minerals  1 tablet Per Tube Daily   polyethylene glycol  17 g Per Tube Daily   rosuvastatin  20 mg Per Tube Daily   thiamine   100 mg Intravenous Daily   Continuous Infusions:  cefTRIAXone (ROCEPHIN)  IV Stopped (09/18/22 1007)   doxycycline (VIBRAMYCIN) IV Stopped (09/18/22 1226)   fentaNYL infusion INTRAVENOUS 200 mcg/hr (09/18/22 1300)   norepinephrine (LEVOPHED) Adult infusion 2 mcg/min (09/18/22 1300)   propofol (DIPRIVAN) infusion 45 mcg/kg/min (09/18/22 1300)   Labs Reviewed: BUN 32, creatinine 1.58 CBG ranges from 106-431 mg/dL over the last 24 hours  NUTRITION - FOCUSED PHYSICAL EXAM: Flowsheet Row Most Recent Value  Orbital Region Mild depletion  Upper Arm Region No depletion  Thoracic and Lumbar Region No depletion  Buccal Region No depletion  Temple Region No depletion  Clavicle Bone Region No depletion  Clavicle and Acromion Bone Region No depletion  Scapular Bone Region No depletion  Dorsal Hand No depletion  Patellar Region No depletion  Anterior Thigh Region No depletion  Posterior Calf Region No depletion  Edema (RD Assessment) None  Hair Reviewed  Eyes Reviewed  Mouth Reviewed  Skin Reviewed  Nails Reviewed  [painted dark blue]    Diet Order:   Diet Order             Diet NPO time specified  Diet effective now  EDUCATION NEEDS:   Not appropriate for education at this time  Skin:  Skin Assessment: Reviewed RN Assessment stage 1: right buttocks (7 cm x 10 cm)  Last BM:  unsure  Height:   Ht Readings from Last 1 Encounters:  09/16/22 5\' 6"  (1.676 m)    Weight:   Wt Readings from Last 1 Encounters:  09/18/22 83.6 kg    Ideal Body Weight:  59.1 kg  BMI:  Body mass index is 29.75 kg/m.  Estimated Nutritional Needs:  Kcal:  1700-1900 kcal/d Protein:  90-105g/d Fluid:  1.8-2L/d    Greig Castilla, RD, LDN Clinical Dietitian RD pager # available in AMION  After hours/weekend pager # available in [facility]

## 2022-09-18 NOTE — Progress Notes (Signed)
Shift Night Interventions:  Patient around 630 starting being diaphoretic and more confused.  RR 40's HR 140's and SpO2 in the 80's.  MD informed  throughout the shift, RRT not present until 645 and several interventions were applied as follows:  Recent to Previous Klonopin 1 mg PO for Anxiety Potassium 10 mEq x extra runs 1 of 2 given (5 ordered in total) LR 250 ml bolus Potassium 10 mEq x extra runs 1 of 3 given Oxycodone from PRN list with Melatonin PRN and scheduled Risperdal   Had a prior RRT around 1820.   Diaphoretic, C/O Chest tightness received Nebulizer Treatment.  And then C/O chest pain going down her left arm in which she received 2 Nitro tabs. NS 500 ml bolus.  Report given to 37M RN at bedside.

## 2022-09-18 NOTE — Significant Event (Addendum)
Rapid Response Event Note   Initial Focused Assessment:  Pt lying in bed, restless. Confused. Warm, diaphoretic. Tachypneic. Inspiratory and expiratory wheeze noted. Minimal air movement heard in bilateral lung bases.   VS: BP 124/94, HR 143, RR 35, SpO2 92% on BiPAP 60% 18/8  Interventions:  -Solu-Medrol 125mg  IV -CXR, delayed due to pending ETT placement  -Pt pulling off BiPAP, pt transferred to ICU on 100% NRB, SpO2 maintained >92% during transport  Plan of Care:  Transfer to ICU  Event Summary:  MD Notified: Dr. Peggye Ley Time: [id number] End Time: 0750  Jennye Moccasin, RN

## 2022-09-19 ENCOUNTER — Inpatient Hospital Stay (HOSPITAL_COMMUNITY): Payer: BLUE CROSS/BLUE SHIELD

## 2022-09-19 DIAGNOSIS — J441 Chronic obstructive pulmonary disease with (acute) exacerbation: Secondary | ICD-10-CM | POA: Diagnosis not present

## 2022-09-19 LAB — GLUCOSE, CAPILLARY
Glucose-Capillary: 158 mg/dL — ABNORMAL HIGH (ref 70–99)
Glucose-Capillary: 214 mg/dL — ABNORMAL HIGH (ref 70–99)
Glucose-Capillary: 224 mg/dL — ABNORMAL HIGH (ref 70–99)
Glucose-Capillary: 241 mg/dL — ABNORMAL HIGH (ref 70–99)
Glucose-Capillary: 228 mg/dL — ABNORMAL HIGH (ref 70–99)
Glucose-Capillary: 156 mg/dL — ABNORMAL HIGH (ref 70–99)

## 2022-09-19 LAB — CBC WITH DIFFERENTIAL/PLATELET
Abs Immature Granulocytes: 0.04 10*3/uL (ref 0.00–0.07)
Basophils Absolute: 0 10*3/uL (ref 0.0–0.1)
Basophils Relative: 0 %
Eosinophils Absolute: 0 10*3/uL (ref 0.0–0.5)
Eosinophils Relative: 0 %
HCT: 33.4 % — ABNORMAL LOW (ref 36.0–46.0)
Hemoglobin: 10.2 g/dL — ABNORMAL LOW (ref 12.0–15.0)
Immature Granulocytes: 0 %
Lymphocytes Relative: 9 %
Lymphs Abs: 0.9 10*3/uL (ref 0.7–4.0)
MCH: 28.2 pg (ref 26.0–34.0)
MCHC: 30.5 g/dL (ref 30.0–36.0)
MCV: 92.3 fL (ref 80.0–100.0)
Monocytes Absolute: 0.5 10*3/uL (ref 0.1–1.0)
Monocytes Relative: 5 %
Neutro Abs: 9.1 10*3/uL — ABNORMAL HIGH (ref 1.7–7.7)
Neutrophils Relative %: 86 %
Platelets: 166 10*3/uL (ref 150–400)
RBC: 3.62 MIL/uL — ABNORMAL LOW (ref 3.87–5.11)
RDW: 14.9 % (ref 11.5–15.5)
WBC: 10.6 10*3/uL — ABNORMAL HIGH (ref 4.0–10.5)
nRBC: 0 % (ref 0.0–0.2)

## 2022-09-19 LAB — MAGNESIUM
Magnesium: 2.1 mg/dL (ref 1.7–2.4)
Magnesium: 2.4 mg/dL (ref 1.7–2.4)

## 2022-09-19 LAB — BASIC METABOLIC PANEL
Anion gap: 7 (ref 5–15)
BUN: 43 mg/dL — ABNORMAL HIGH (ref 6–20)
CO2: 26 mmol/L (ref 22–32)
Calcium: 8.1 mg/dL — ABNORMAL LOW (ref 8.9–10.3)
Chloride: 101 mmol/L (ref 98–111)
Creatinine, Ser: 1.29 mg/dL — ABNORMAL HIGH (ref 0.44–1.00)
GFR, Estimated: 50 mL/min — ABNORMAL LOW (ref >60)
Glucose, Bld: 201 mg/dL — ABNORMAL HIGH (ref 70–99)
Potassium: 4.8 mmol/L (ref 3.5–5.1)
Sodium: 134 mmol/L — ABNORMAL LOW (ref 135–145)

## 2022-09-19 LAB — RESPIRATORY PANEL BY PCR
Adenovirus: NOT DETECTED
Bordetella Parapertussis: NOT DETECTED
Bordetella pertussis: NOT DETECTED
Chlamydophila pneumoniae: NOT DETECTED
Coronavirus 229E: NOT DETECTED
Coronavirus HKU1: NOT DETECTED
Coronavirus NL63: DETECTED — AB
Coronavirus OC43: NOT DETECTED
Influenza A: NOT DETECTED
Influenza B: NOT DETECTED
Metapneumovirus: NOT DETECTED
Mycoplasma pneumoniae: NOT DETECTED
Parainfluenza Virus 1: NOT DETECTED
Parainfluenza Virus 2: NOT DETECTED
Parainfluenza Virus 3: NOT DETECTED
Parainfluenza Virus 4: NOT DETECTED
Respiratory Syncytial Virus: NOT DETECTED
Rhinovirus / Enterovirus: DETECTED — AB

## 2022-09-19 LAB — TRIGLYCERIDES: Triglycerides: 214 mg/dL — ABNORMAL HIGH (ref <150)

## 2022-09-19 LAB — PHOSPHORUS
Phosphorus: 5 mg/dL — ABNORMAL HIGH (ref 2.5–4.6)
Phosphorus: 4.8 mg/dL — ABNORMAL HIGH (ref 2.5–4.6)

## 2022-09-19 LAB — COOXEMETRY PANEL
Carboxyhemoglobin: 1 % (ref 0.5–1.5)
Methemoglobin: <0.7 % (ref 0.0–1.5)
O2 Saturation: 61.7 %
Total hemoglobin: 9.7 g/dL — ABNORMAL LOW (ref 12.0–16.0)

## 2022-09-19 MED ORDER — SENNA 8.6 MG PO TABS
1.0000 | ORAL_TABLET | Freq: Every day | ORAL | Status: DC
  Administered 2022-09-19 – 2022-09-21 (×3): 8.6 mg
  Filled 2022-09-19 (×3): qty 1

## 2022-09-19 MED ORDER — INSULIN GLARGINE-YFGN 100 UNIT/ML ~~LOC~~ SOLN
22.0000 [IU] | Freq: Two times a day (BID) | SUBCUTANEOUS | Status: DC
  Administered 2022-09-19 – 2022-09-22 (×6): 22 [IU] via SUBCUTANEOUS
  Filled 2022-09-19 (×7): qty 0.22

## 2022-09-19 MED ORDER — HYDROMORPHONE HCL 1 MG/ML IJ SOLN
1.0000 mg | INTRAMUSCULAR | Status: DC | PRN
  Administered 2022-09-19 (×7): 1 mg via INTRAVENOUS
  Administered 2022-09-20: 2 mg via INTRAVENOUS
  Administered 2022-09-20 (×3): 1 mg via INTRAVENOUS
  Filled 2022-09-19: qty 2
  Filled 2022-09-19 (×11): qty 1

## 2022-09-19 MED ORDER — FUROSEMIDE 10 MG/ML IJ SOLN
40.0000 mg | Freq: Three times a day (TID) | INTRAMUSCULAR | Status: DC
  Administered 2022-09-19 – 2022-09-20 (×3): 40 mg via INTRAVENOUS
  Filled 2022-09-19 (×3): qty 4

## 2022-09-19 MED ORDER — OXYCODONE HCL 5 MG PO TABS
5.0000 mg | ORAL_TABLET | Freq: Four times a day (QID) | ORAL | Status: DC
  Administered 2022-09-19 (×2): 5 mg via ORAL
  Filled 2022-09-19 (×2): qty 1

## 2022-09-19 MED ORDER — OXYCODONE HCL 5 MG PO TABS
5.0000 mg | ORAL_TABLET | Freq: Four times a day (QID) | ORAL | Status: DC
  Administered 2022-09-19 – 2022-09-20 (×2): 5 mg
  Filled 2022-09-19 (×2): qty 1

## 2022-09-19 MED ORDER — CLONAZEPAM 0.25 MG PO TBDP: 1.0000 mg | ORAL_TABLET | Freq: Three times a day (TID) | ORAL | Status: DC | PRN

## 2022-09-19 MED ORDER — ACETAMINOPHEN 325 MG PO TABS
650.0000 mg | ORAL_TABLET | Freq: Four times a day (QID) | ORAL | Status: DC
  Administered 2022-09-19 – 2022-09-21 (×10): 650 mg
  Filled 2022-09-19 (×10): qty 2

## 2022-09-19 MED ORDER — DEXMEDETOMIDINE HCL IN NACL 400 MCG/100ML IV SOLN
0.0000 ug/kg/h | INTRAVENOUS | Status: DC
  Administered 2022-09-19 (×2): 0.4 ug/kg/h via INTRAVENOUS
  Administered 2022-09-19: 0.8 ug/kg/h via INTRAVENOUS
  Administered 2022-09-19: 0.9 ug/kg/h via INTRAVENOUS
  Administered 2022-09-20 (×3): 1 ug/kg/h via INTRAVENOUS
  Administered 2022-09-20: 0.9 ug/kg/h via INTRAVENOUS
  Administered 2022-09-21 (×2): 1.2 ug/kg/h via INTRAVENOUS
  Administered 2022-09-21: 0.8 ug/kg/h via INTRAVENOUS
  Administered 2022-09-21: 1.2 ug/kg/h via INTRAVENOUS
  Administered 2022-09-22: 0.5 ug/kg/h via INTRAVENOUS
  Administered 2022-09-22: 0.05 ug/kg/h via INTRAVENOUS
  Filled 2022-09-19 (×15): qty 100

## 2022-09-19 MED ORDER — SODIUM CHLORIDE 0.9 % IV SOLN
100.0000 mg | Freq: Two times a day (BID) | INTRAVENOUS | Status: AC
  Administered 2022-09-19 – 2022-09-21 (×6): 100 mg via INTRAVENOUS
  Filled 2022-09-19 (×6): qty 100

## 2022-09-19 MED ORDER — METHYLPREDNISOLONE SODIUM SUCC 40 MG IJ SOLR
40.0000 mg | INTRAMUSCULAR | Status: AC
  Administered 2022-09-20 – 2022-09-21 (×2): 40 mg via INTRAVENOUS
  Filled 2022-09-19 (×2): qty 1

## 2022-09-19 NOTE — Consult Note (Signed)
NAME:  [name] [name], MRN:  [id number], DOB:  1971/03/19, LOS: 2 ADMISSION DATE:  09/16/2022, CONSULTATION DATE: 14 2024 REFERRING MD: Triad, CHIEF COMPLAINT: Acute respiratory failure  History of Present Illness:  52 year old female with extensive past medical history is well-documented below pulmonary critical care called to bedside for acute exacerbation of COPD and acute respiratory failure.  She was transferred to intensive care unit urgently intubated.  Chest x-ray shows proper placement of NG tube and endotracheal tube.  She will be maintained in the intensive care unit until stabilized.  Pertinent  Medical History   Past Medical History:  Diagnosis Date   Anxiety disorder    Asthma    Bipolar affective disorder    Blood transfusion without reported diagnosis    CHF (congestive heart failure)    COPD (chronic obstructive pulmonary disease)    Depression    Diabetes mellitus    Migraine    Schizophrenia     Significant Hospital Events: Including procedures, antibiotic start and stop dates in addition to other pertinent events   4/10 transferred to ICU for respiratory failure 4/10 intubated, central line placed -> right sided pneumothorax and chest tube placed  Interim History / Subjective:   Continues to be on ventilator, desaturated when trying to wean this morning. Recurrent urinary retention  Objective   Blood pressure 93/66, pulse 94, temperature 98.9 F (37.2 C), temperature source Oral, resp. rate (!) 28, height  (1.676 m), weight 83.6 kg, SpO2 97 %.    Vent Mode: PRVC FiO2 (%):  [40 %-60 %] 40 % Set Rate:  [28 bmp] 28 bmp Vt Set:  [460 mL] 460 mL PEEP:  [5 cmH20-10 cmH20] 8 cmH20 Plateau Pressure:  [19 cmH20-23 cmH20] 19 cmH20   Intake/Output Summary (Last 24 hours) at 09/19/2022 0953 Last data filed at 09/19/2022 0813 Gross per 24 hour  Intake 2508.11 ml  Output 885 ml  Net 1623.11 ml   Filed Weights   09/16/22 2115 09/18/22 0538 09/18/22 0752   Weight: 83.7 kg 84.7 kg 83.6 kg   Examination: General: ill appearing HENT: ET tube in place. Right IJ CVC in plane Lungs: coarse breath sounds bilaterally. Right chest tube in place.  Cardiovascular: Regular heart sounds, no murmurs/gallops/rubs Abdomen: soft, non-distended Extremities: warm, 1+ edema Neuro: sedate  Assessment & Plan:   Acute hypercarbic respiratory failure s/p intubation 4/10 Hx of COPD, no PFT's Long term tobacco use disorder. Presented with dyspnea thought to be secondary to COPD exacerbation. Unclear what led to this exacerbation, was question of substance use but again unsure. CO2 elevated on blood gas. Imaging not concerning for infectious etiology, procal under .10. Will continue doxycyline for COPD exacerbation. Viral panel pending, COVID negative. - continue vent management - yupelri daily - decrease methylpred to 40 mg day 2 - continue doxycycline for 5 days for COPD exacerbation  Acute on chronic combined heart failure Non-ischemic cardiomyopathy Severe mitral regurgitation  Hx of HFrEF with EF of 20-25%. NYHA III prior to admission. Dry weight per chart review is 175 lbs/79 kg. CVP 20-25 today. COOX 61.7/. CO 4.6, CI 2.3. Perfusing well. Will start diuretics today to help offload fluid. Do not suspect low output state or need for inotropes  - lasix 40 TID - daily weights - likely will place foley - electrolytes stable  Right sided pneumothorax Occurred after placement of central line. Chest tube placed yesterday and pneumothorax resolved.  - continue to montior  CKD3a Cr 1.29 today, GFR at  baseline.  - daily bmp  Type 2 diabetes Semglee 10 BID and SSI while on steroids  Bipolar disorder type 2 - continue risperidone  Tobacco abuse Smoking cessation  Chronic pain syndrome - continue to titrate pain mediations, will start oxycodone 5 mg per tube q6h and dilaudid PRN  Thyroidism Methimazole 5 mg daily - repeat TSH and T4  tomorrow  Best Practice (right click and "Reselect all SmartList Selections" daily)   Diet/type: OG tube DVT prophylaxis: LMWH GI prophylaxis: PPI Lines: central line Foley:  will likely need foley Code Status:  full code   Labs   CBC: Recent Labs  Lab 09/16/22 2120 09/17/22 0313 09/17/22 0333 09/18/22 0924 09/18/22 1200 09/19/22 0529  WBC 6.0 7.0  --   --   --  10.6*  NEUTROABS 3.6 6.5  --   --   --  9.1*  HGB 12.2 11.1* 11.9* 12.2 10.9* 10.2*  HCT 38.0 36.7 35.0* 36.0 32.0* 33.4*  MCV 89.0 91.8  --   --   --  92.3  PLT 195 172  --   --   --  166    Basic Metabolic Panel: Recent Labs  Lab 09/16/22 2120 09/16/22 2255 09/17/22 0313 09/17/22 0333 09/18/22 0003 09/18/22 0924 09/18/22 1200 09/18/22 1755 09/19/22 0529  NA 139  --  134* 134* 135 136 137  --  134*  K 4.2  --  3.9 3.9 3.3* 4.8 4.6  --  4.8  CL 98  --  94*  --  100  --   --   --  101  CO2 28  --  26  --  28  --   --   --  26  GLUCOSE 270*  --  506*  --  92  --   --   --  201*  BUN 20  --  20  --  32*  --   --   --  43*  CREATININE 1.39*  --  1.47*  --  1.58*  --   --   --  1.29*  CALCIUM 8.5*  --  8.1*  --  7.8*  --   --   --  8.1*  MG  --  1.7 1.8  --  2.1  --   --  2.2 2.1  PHOS  --   --  4.1  --   --   --   --  3.4 5.0*   GFR: Estimated Creatinine Clearance: 54.9 mL/min (A) (by C-G formula based on SCr of 1.29 mg/dL (H)). Recent Labs  Lab 09/16/22 2120 09/17/22 0313 09/19/22 0529  PROCALCITON  --  <0.10  --   WBC 6.0 7.0 10.6*    Liver Function Tests: Recent Labs  Lab 09/16/22 2120 09/17/22 0313  AST 19 21  ALT 17 16  ALKPHOS 90 93  BILITOT 0.7 0.4  PROT 6.2* 5.9*  ALBUMIN 3.5 3.3*   No results for input(s): "LIPASE", "AMYLASE" in the last 168 hours. No results for input(s): "AMMONIA" in the last 168 hours.  ABG    Component Value Date/Time   PHART 7.445 09/18/2022 1200   PCO2ART 36.1 09/18/2022 1200   PO2ART 116 (H) 09/18/2022 1200   HCO3 24.7 09/18/2022 1200   TCO2 26  09/18/2022 1200   ACIDBASEDEF 1.0 09/18/2022 0924   O2SAT 99 09/18/2022 1200     Coagulation Profile: No results for input(s): "INR", "PROTIME" in the last 168 hours.  Cardiac Enzymes: No results for input(s): "  CKTOTAL", "CKMB", "CKMBINDEX", "TROPONINI" in the last 168 hours.  HbA1C: Hgb A1c MFr Bld  Date/Time Value Ref Range Status  08/27/2022 03:41 AM 11.0 (H) 4.8 - 5.6 % Final    Comment:    (NOTE)         Prediabetes: 5.7 - 6.4         Diabetes: >6.4         Glycemic control for adults with diabetes: <7.0   05/16/2022 01:10 AM 8.1 (H) 4.8 - 5.6 % Final    Comment:    (NOTE)         Prediabetes: 5.7 - 6.4         Diabetes: >6.4         Glycemic control for adults with diabetes: <7.0     CBG: Recent Labs  Lab 09/18/22 1922 09/18/22 2133 09/18/22 2329 09/19/22 0313 09/19/22 0813  GLUCAP 154* 129* 132* 158* 214*    Review of Systems:   na  Past Medical History:  She,  has a past medical history of Anxiety disorder, Asthma, Bipolar affective disorder, Blood transfusion without reported diagnosis, CHF (congestive heart failure), COPD (chronic obstructive pulmonary disease), Depression, Diabetes mellitus, Migraine, and Schizophrenia.   Surgical History:   Past Surgical History:  Procedure Laterality Date   KNEE ARTHROSCOPY     x 2   KNEE SURGERY Left    Incision made and knee cleaned out   TUBAL LIGATION       Social History:   reports that she has been smoking cigarettes. She has a 29.00 pack-year smoking history. She has never used smokeless tobacco. She reports that she does not currently use alcohol. She reports that she does not use drugs.   Family History:  Her family history includes Bipolar disorder in her mother; Diabetes in her father; Hypertension in her father.   Allergies Allergies  Allergen Reactions   Tramadol Nausea Only and Rash   Benadryl [Diphenhydramine]     Decreases motor skills and increases anxiety   Wellbutrin [Bupropion]  Other (See Comments)    rage   Nicotine Rash    Patient states she is allergic to the Nicotine patch and they cause her to break out in a rash.  She states she can smoke cigarettes.     Home Medications  Prior to Admission medications   Medication Sig Start Date End Date Taking? Authorizing Provider  acetaminophen (TYLENOL) 325 MG tablet Take 1 tablet (325 mg total) by mouth every 6 (six) hours as needed for headache or mild pain. 09/02/22  Yes Wouk, Wilfred CurtisNoah Bedford, MD  ASPIRIN LOW DOSE 81 MG tablet Take 1 tablet (81 mg total) by mouth daily. 08/23/22  Yes Gherghe, Daylene Katayamaostin M, MD  bumetanide (BUMEX) 2 MG tablet Take 1 tablet (2 mg total) by mouth 2 (two) times daily. 08/29/22 09/28/22 Yes Arrien, York RamMauricio Daniel, MD  insulin glargine, 1 Unit Dial, (TOUJEO SOLOSTAR) 300 UNIT/ML Solostar Pen Inject 24 Units into the skin at bedtime. 09/02/22 10/02/22 Yes Wouk, Wilfred CurtisNoah Bedford, MD  isosorbide mononitrate (IMDUR) 60 MG 24 hr tablet Take 1 tablet (60 mg total) by mouth daily. 08/23/22 09/22/22 Yes Gherghe, Daylene Katayamaostin M, MD  metoprolol succinate (TOPROL-XL) 25 MG 24 hr tablet Take 1 tablet (25 mg total) by mouth daily. 08/23/22 09/22/22 Yes Gherghe, Daylene Katayamaostin M, MD  nitroGLYCERIN (NITROSTAT) 0.4 MG SL tablet Place 1 tablet (0.4 mg total) under the tongue every 5 (five) minutes as needed for chest pain. 08/23/22  Yes Gherghe, Costin  M, MD  oxyCODONE-acetaminophen (PERCOCET) 10-325 MG tablet Take 1 tablet by mouth every 6 (six) hours as needed for pain. 09/02/22  Yes Wouk, Wilfred Curtis, MD  potassium chloride SA (KLOR-CON M) 20 MEQ tablet Take 1 tablet (20 mEq total) by mouth daily. Take with bumetanide. 08/29/22  Yes Arrien, York Ram, MD  risperiDONE (RISPERDAL) 2 MG tablet Take one-half tablet in the morning and one tablet in the evening. 08/23/22  Yes Gherghe, Daylene Katayama, MD  rosuvastatin (CRESTOR) 20 MG tablet Take 1 tablet (20 mg total) by mouth daily. 08/30/22 09/29/22 Yes Arrien, York Ram, MD  blood glucose meter  kit and supplies Dispense based on patient and insurance preference. Use up to four times daily as directed. (FOR ICD-10 E10.9, E11.9). 01/06/18   Rodolph Bong, MD  Insulin Pen Needle 32G X 4 MM MISC Inject 1 each into the skin in the morning. 09/02/22   Wouk, Wilfred Curtis, MD  methimazole (TAPAZOLE) 5 MG tablet Take 1 tablet (5 mg total) by mouth daily for 21 days. 08/29/22 09/19/22  Arrien, York Ram, MD  benztropine (COGENTIN) 1 MG tablet Take 1 tablet (1 mg total) by mouth 2 (two) times daily. 12/23/18 06/04/20  Malvin Johns, MD  insulin aspart (NOVOLOG) 100 UNIT/ML injection Inject 0-9 Units into the skin 3 (three) times daily with meals. Patient not taking: Reported on 06/03/2020 12/16/18 06/04/20  Lanae Boast, MD    Thalia Bloodgood DO  Internal Medicine Resident PGY-3 [place]  Pager: [phone]

## 2022-09-20 DIAGNOSIS — J441 Chronic obstructive pulmonary disease with (acute) exacerbation: Secondary | ICD-10-CM | POA: Diagnosis not present

## 2022-09-20 LAB — GLUCOSE, CAPILLARY
Glucose-Capillary: 110 mg/dL — ABNORMAL HIGH (ref 70–99)
Glucose-Capillary: 213 mg/dL — ABNORMAL HIGH (ref 70–99)
Glucose-Capillary: 268 mg/dL — ABNORMAL HIGH (ref 70–99)
Glucose-Capillary: 349 mg/dL — ABNORMAL HIGH (ref 70–99)
Glucose-Capillary: 208 mg/dL — ABNORMAL HIGH (ref 70–99)
Glucose-Capillary: 96 mg/dL (ref 70–99)

## 2022-09-20 LAB — COOXEMETRY PANEL
Carboxyhemoglobin: 1.6 % — ABNORMAL HIGH (ref 0.5–1.5)
Methemoglobin: <0.7 % (ref 0.0–1.5)
O2 Saturation: 69.3 %
Total hemoglobin: 10.4 g/dL — ABNORMAL LOW (ref 12.0–16.0)

## 2022-09-20 LAB — COMPREHENSIVE METABOLIC PANEL
ALT: 83 U/L — ABNORMAL HIGH (ref 0–44)
AST: 66 U/L — ABNORMAL HIGH (ref 15–41)
Albumin: 2.9 g/dL — ABNORMAL LOW (ref 3.5–5.0)
Alkaline Phosphatase: 106 U/L (ref 38–126)
Anion gap: 9 (ref 5–15)
BUN: 51 mg/dL — ABNORMAL HIGH (ref 6–20)
CO2: 26 mmol/L (ref 22–32)
Calcium: 8.1 mg/dL — ABNORMAL LOW (ref 8.9–10.3)
Chloride: 102 mmol/L (ref 98–111)
Creatinine, Ser: 1.27 mg/dL — ABNORMAL HIGH (ref 0.44–1.00)
GFR, Estimated: 51 mL/min — ABNORMAL LOW (ref >60)
Glucose, Bld: 117 mg/dL — ABNORMAL HIGH (ref 70–99)
Potassium: 4.7 mmol/L (ref 3.5–5.1)
Sodium: 137 mmol/L (ref 135–145)
Total Bilirubin: 0.9 mg/dL (ref 0.3–1.2)
Total Protein: 5.2 g/dL — ABNORMAL LOW (ref 6.5–8.1)

## 2022-09-20 LAB — CBC WITH DIFFERENTIAL/PLATELET
Abs Immature Granulocytes: 0.05 10*3/uL (ref 0.00–0.07)
Basophils Absolute: 0 10*3/uL (ref 0.0–0.1)
Basophils Relative: 1 %
Eosinophils Absolute: 0.1 10*3/uL (ref 0.0–0.5)
Eosinophils Relative: 2 %
HCT: 32 % — ABNORMAL LOW (ref 36.0–46.0)
Hemoglobin: 10.2 g/dL — ABNORMAL LOW (ref 12.0–15.0)
Immature Granulocytes: 1 %
Lymphocytes Relative: 18 %
Lymphs Abs: 1.4 10*3/uL (ref 0.7–4.0)
MCH: 28.9 pg (ref 26.0–34.0)
MCHC: 31.9 g/dL (ref 30.0–36.0)
MCV: 90.7 fL (ref 80.0–100.0)
Monocytes Absolute: 0.5 10*3/uL (ref 0.1–1.0)
Monocytes Relative: 6 %
Neutro Abs: 5.5 10*3/uL (ref 1.7–7.7)
Neutrophils Relative %: 72 %
Platelets: 142 10*3/uL — ABNORMAL LOW (ref 150–400)
RBC: 3.53 MIL/uL — ABNORMAL LOW (ref 3.87–5.11)
RDW: 15.1 % (ref 11.5–15.5)
WBC: 7.6 10*3/uL (ref 4.0–10.5)
nRBC: 0 % (ref 0.0–0.2)

## 2022-09-20 LAB — T4, FREE: Free T4: 0.84 ng/dL (ref 0.61–1.12)

## 2022-09-20 LAB — MAGNESIUM: Magnesium: 2.2 mg/dL (ref 1.7–2.4)

## 2022-09-20 LAB — PHOSPHORUS: Phosphorus: 4.5 mg/dL (ref 2.5–4.6)

## 2022-09-20 MED ORDER — METOLAZONE 2.5 MG PO TABS
5.0000 mg | ORAL_TABLET | Freq: Once | ORAL | Status: AC
  Administered 2022-09-20: 5 mg
  Filled 2022-09-20: qty 2

## 2022-09-20 MED ORDER — OXYCODONE HCL 5 MG PO TABS
10.0000 mg | ORAL_TABLET | Freq: Four times a day (QID) | ORAL | Status: DC
  Administered 2022-09-20 – 2022-09-21 (×5): 10 mg
  Filled 2022-09-20 (×5): qty 2

## 2022-09-20 MED ORDER — NOREPINEPHRINE 4 MG/250ML-% IV SOLN
0.0000 ug/min | INTRAVENOUS | Status: DC
  Administered 2022-09-20: 8 ug/min via INTRAVENOUS
  Administered 2022-09-20: 4 ug/min via INTRAVENOUS
  Administered 2022-09-21: 8 ug/min via INTRAVENOUS
  Filled 2022-09-20 (×3): qty 250

## 2022-09-20 MED ORDER — FUROSEMIDE 10 MG/ML IJ SOLN
80.0000 mg | Freq: Two times a day (BID) | INTRAMUSCULAR | Status: AC
  Administered 2022-09-20 (×2): 80 mg via INTRAVENOUS
  Filled 2022-09-20 (×2): qty 8

## 2022-09-20 MED ORDER — FENTANYL CITRATE PF 50 MCG/ML IJ SOSY
50.0000 ug | PREFILLED_SYRINGE | INTRAMUSCULAR | Status: DC | PRN
  Administered 2022-09-21: 50 ug via INTRAVENOUS
  Filled 2022-09-20: qty 1

## 2022-09-20 NOTE — Progress Notes (Signed)
Reduced Sedation, repositioned patient and waited for next cycle to assess effectiveness of intervention.

## 2022-09-20 NOTE — Progress Notes (Signed)
CVP monitoring started. CVP 9.

## 2022-09-20 NOTE — Consult Note (Signed)
NAME:  [name] [name], MRN:  [id number], DOB:  03-09-1971, LOS: 3 ADMISSION DATE:  09/16/2022, CONSULTATION DATE: 14 2024 REFERRING MD: Triad, CHIEF COMPLAINT: Acute respiratory failure  History of Present Illness:  52 year old female with extensive past medical history is well-documented below pulmonary critical care called to bedside for acute exacerbation of COPD and acute respiratory failure.  She was transferred to intensive care unit urgently intubated.  Chest x-ray shows proper placement of NG tube and endotracheal tube.  She will be maintained in the intensive care unit until stabilized.  Pertinent  Medical History   Past Medical History:  Diagnosis Date   Anxiety disorder    Asthma    Bipolar affective disorder    Blood transfusion without reported diagnosis    CHF (congestive heart failure)    COPD (chronic obstructive pulmonary disease)    Depression    Diabetes mellitus    Migraine    Schizophrenia    Significant Hospital Events: Including procedures, antibiotic start and stop dates in addition to other pertinent events   4/10 transferred to ICU for respiratory failure 4/10 intubated, central line placed -> right sided pneumothorax and chest tube placed  Interim History / Subjective:   Frequently requiring fentanly pushes. Will try SBT today and consider removal of ventilator.   Objective   Blood pressure (!) 99/58, pulse 87, temperature 100.2 F (37.9 C), resp. rate (!) 28, height 5\' 6"  (1.676 m), weight 85.7 kg, SpO2 98 %. CVP:  [13 mmHg-18 mmHg] 13 mmHg  Vent Mode: PRVC FiO2 (%):  [40 %] 40 % Set Rate:  [28 bmp] 28 bmp Vt Set:  [460 mL] 460 mL PEEP:  [5 cmH20-8 cmH20] 8 cmH20 Plateau Pressure:  [14 cmH20-21 cmH20] 15 cmH20   Intake/Output Summary (Last 24 hours) at 09/20/2022 0703 Last data filed at 09/20/2022 0600 Gross per 24 hour  Intake 2745.04 ml  Output 1784 ml  Net 961.04 ml    Filed Weights   09/18/22 0538 09/18/22 0752 09/20/22 0500  Weight:  84.7 kg 83.6 kg 85.7 kg   Examination: General: ill appearing HENT: ET tube in place. Right IJ CVC in plane Lungs: coarse breath sounds bilaterally Cardiovascular: Regular heart sounds, regular rate and rhythm Abdomen: soft, non-distended. Non-tender Extremities: warm, 1+ edema Neuro: following commands  Assessment & Plan:   Acute hypercarbic respiratory failure s/p intubation 4/10 2/2 rhinovirus/coronovirus Hx of COPD vs asthma, no PFT's Long term tobacco use disorder. Per son hx of COPD and asthma, no PFT's on file. Viral panel yesterday positive for coronvirus nl63 (not SARS) and rhinovirus likely the etiology for her respiratory failure. Will trial SBT today. Continue to diurese and plan for extubation in following days -continue vent management, SBT this morning -yupelri daily -try to wean off versed today and continue sedation with propofol and precedex.  -methylpred 40 mg day 4/5 -doxycycline day 5/5  Hypotension Required levophed likely in setting of multiple sedatives. Do not suspect cardiogenic shock.  -wean levophed as able with weaning sedatives  Acute on chronic combined heart failure Non-ischemic cardiomyopathy Severe mitral regurgitation  Hx of HFrEF with EF of 20-25%. NYHA III prior to admission. Dry weight per chart review is 175 lbs/79 kg.  CVP today of 14/11. Weight today of 85.7 kg. Adequate urinary output, but I/O net positive. Home regimen of bumex. Will increase lasix today to 80 mg BID and add metolazone. Do not suspect in low output state.  -increase lasix 80 mg Bid today, add metolazone  -  daily weights -electrolytes stable -follow up coox and CVP  Right sided pneumothorax Occurred after placement of central line. Pneumothorax resolved. 16 cc output. Plan to remove whenever she is extubated -continue to montior  CKD3a Cr 1.2 stable. Monitor electrolytes while diuresing  - daily bmp  Elevated transaminase levels Likely in setting of acute  congestion. Will repeat in coming days and expect improvement with diuresis   Type 2 diabetes -semglee 22 BID and SSI while on steroids  Bipolar disorder type 2 -continue risperidone  Tobacco abuse -smoking cessation  Chronic pain syndrome -continue to titrate pain mediations, will start oxycodone 5 mg per tube q6h and dilaudid PRN  Thyroidism T4 normal. This was started with abnormal thyroid testing w/ t4 1.41 and TSH of .24. Will hold on further treatment with methimazole and she will need to have these rechecked when not in acute illness  Best Practice (right click and "Reselect all SmartList Selections" daily)   Diet/type: OG tube DVT prophylaxis: LMWH GI prophylaxis: PPI Lines: central line Foley:  will likely need foley Code Status:  full code  Labs   CBC: Recent Labs  Lab 09/16/22 2120 09/17/22 0313 09/17/22 0333 09/18/22 0924 09/18/22 1200 09/19/22 0529  WBC 6.0 7.0  --   --   --  10.6*  NEUTROABS 3.6 6.5  --   --   --  9.1*  HGB 12.2 11.1* 11.9* 12.2 10.9* 10.2*  HCT 38.0 36.7 35.0* 36.0 32.0* 33.4*  MCV 89.0 91.8  --   --   --  92.3  PLT 195 172  --   --   --  166     Basic Metabolic Panel: Recent Labs  Lab 09/16/22 2120 09/16/22 2255 09/17/22 0313 09/17/22 0333 09/18/22 0003 09/18/22 0924 09/18/22 1200 09/18/22 1755 09/19/22 0529 09/19/22 1813 09/20/22 0325  NA 139  --  134*   < > 135 136 137  --  134*  --  137  K 4.2  --  3.9   < > 3.3* 4.8 4.6  --  4.8  --  4.7  CL 98  --  94*  --  100  --   --   --  101  --  102  CO2 28  --  26  --  28  --   --   --  26  --  26  GLUCOSE 270*  --  506*  --  92  --   --   --  201*  --  117*  BUN 20  --  20  --  32*  --   --   --  43*  --  51*  CREATININE 1.39*  --  1.47*  --  1.58*  --   --   --  1.29*  --  1.27*  CALCIUM 8.5*  --  8.1*  --  7.8*  --   --   --  8.1*  --  8.1*  MG  --    < > 1.8  --  2.1  --   --  2.2 2.1 2.4 2.2  PHOS  --   --  4.1  --   --   --   --  3.4 5.0* 4.8* 4.5   < > = values in  this interval not displayed.    GFR: Estimated Creatinine Clearance: 56.5 mL/min (A) (by C-G formula based on SCr of 1.27 mg/dL (H)). Recent Labs  Lab 09/16/22 2120 09/17/22 0313 09/19/22 0529  PROCALCITON  --  <  0.10  --   WBC 6.0 7.0 10.6*     Liver Function Tests: Recent Labs  Lab 09/16/22 2120 09/17/22 0313 09/20/22 0325  AST 19 21 66*  ALT 17 16 83*  ALKPHOS 90 93 106  BILITOT 0.7 0.4 0.9  PROT 6.2* 5.9* 5.2*  ALBUMIN 3.5 3.3* 2.9*    No results for input(s): "LIPASE", "AMYLASE" in the last 168 hours. No results for input(s): "AMMONIA" in the last 168 hours.  ABG    Component Value Date/Time   PHART 7.445 09/18/2022 1200   PCO2ART 36.1 09/18/2022 1200   PO2ART 116 (H) 09/18/2022 1200   HCO3 24.7 09/18/2022 1200   TCO2 26 09/18/2022 1200   ACIDBASEDEF 1.0 09/18/2022 0924   O2SAT 61.7 09/19/2022 1005     Coagulation Profile: No results for input(s): "INR", "PROTIME" in the last 168 hours.  Cardiac Enzymes: No results for input(s): "CKTOTAL", "CKMB", "CKMBINDEX", "TROPONINI" in the last 168 hours.  HbA1C: Hgb A1c MFr Bld  Date/Time Value Ref Range Status  08/27/2022 03:41 AM 11.0 (H) 4.8 - 5.6 % Final    Comment:    (NOTE)         Prediabetes: 5.7 - 6.4         Diabetes: >6.4         Glycemic control for adults with diabetes: <7.0   05/16/2022 01:10 AM 8.1 (H) 4.8 - 5.6 % Final    Comment:    (NOTE)         Prediabetes: 5.7 - 6.4         Diabetes: >6.4         Glycemic control for adults with diabetes: <7.0     CBG: Recent Labs  Lab 09/19/22 1149 09/19/22 1531 09/19/22 1925 09/19/22 2325 09/20/22 0318  GLUCAP 224* 241* 228* 156* 110*     Review of Systems:   na  Past Medical History:  She,  has a past medical history of Anxiety disorder, Asthma, Bipolar affective disorder, Blood transfusion without reported diagnosis, CHF (congestive heart failure), COPD (chronic obstructive pulmonary disease), Depression, Diabetes mellitus,  Migraine, and Schizophrenia.   Surgical History:   Past Surgical History:  Procedure Laterality Date   KNEE ARTHROSCOPY     x 2   KNEE SURGERY Left    Incision made and knee cleaned out   TUBAL LIGATION       Social History:   reports that she has been smoking cigarettes. She has a 29.00 pack-year smoking history. She has never used smokeless tobacco. She reports that she does not currently use alcohol. She reports that she does not use drugs.   Family History:  Her family history includes Bipolar disorder in her mother; Diabetes in her father; Hypertension in her father.   Allergies Allergies  Allergen Reactions   Tramadol Nausea Only and Rash   Benadryl [Diphenhydramine]     Decreases motor skills and increases anxiety   Wellbutrin [Bupropion] Other (See Comments)    rage   Nicotine Rash    Patient states she is allergic to the Nicotine patch and they cause her to break out in a rash.  She states she can smoke cigarettes.     Home Medications  Prior to Admission medications   Medication Sig Start Date End Date Taking? Authorizing Provider  acetaminophen (TYLENOL) 325 MG tablet Take 1 tablet (325 mg total) by mouth every 6 (six) hours as needed for headache or mild pain. 09/02/22  Yes Wouk, [place],  MD  ASPIRIN LOW DOSE 81 MG tablet Take 1 tablet (81 mg total) by mouth daily. 08/23/22  Yes Gherghe, Daylene Katayama, MD  bumetanide (BUMEX) 2 MG tablet Take 1 tablet (2 mg total) by mouth 2 (two) times daily. 08/29/22 09/28/22 Yes Arrien, York Ram, MD  insulin glargine, 1 Unit Dial, (TOUJEO SOLOSTAR) 300 UNIT/ML Solostar Pen Inject 24 Units into the skin at bedtime. 09/02/22 10/02/22 Yes Wouk, Wilfred Curtis, MD  isosorbide mononitrate (IMDUR) 60 MG 24 hr tablet Take 1 tablet (60 mg total) by mouth daily. 08/23/22 09/22/22 Yes Gherghe, Daylene Katayama, MD  metoprolol succinate (TOPROL-XL) 25 MG 24 hr tablet Take 1 tablet (25 mg total) by mouth daily. 08/23/22 09/22/22 Yes Gherghe, Daylene Katayama, MD   nitroGLYCERIN (NITROSTAT) 0.4 MG SL tablet Place 1 tablet (0.4 mg total) under the tongue every 5 (five) minutes as needed for chest pain. 08/23/22  Yes Gherghe, Daylene Katayama, MD  oxyCODONE-acetaminophen (PERCOCET) 10-325 MG tablet Take 1 tablet by mouth every 6 (six) hours as needed for pain. 09/02/22  Yes Wouk, Wilfred Curtis, MD  potassium chloride SA (KLOR-CON M) 20 MEQ tablet Take 1 tablet (20 mEq total) by mouth daily. Take with bumetanide. 08/29/22  Yes Arrien, York Ram, MD  risperiDONE (RISPERDAL) 2 MG tablet Take one-half tablet in the morning and one tablet in the evening. 08/23/22  Yes Gherghe, Daylene Katayama, MD  rosuvastatin (CRESTOR) 20 MG tablet Take 1 tablet (20 mg total) by mouth daily. 08/30/22 09/29/22 Yes Arrien, York Ram, MD  blood glucose meter kit and supplies Dispense based on patient and insurance preference. Use up to four times daily as directed. (FOR ICD-10 E10.9, E11.9). 01/06/18   Rodolph Bong, MD  Insulin Pen Needle 32G X 4 MM MISC Inject 1 each into the skin in the morning. 09/02/22   Wouk, Wilfred Curtis, MD  methimazole (TAPAZOLE) 5 MG tablet Take 1 tablet (5 mg total) by mouth daily for 21 days. 08/29/22 09/19/22  Arrien, York Ram, MD  benztropine (COGENTIN) 1 MG tablet Take 1 tablet (1 mg total) by mouth 2 (two) times daily. 12/23/18 06/04/20  Malvin Johns, MD  insulin aspart (NOVOLOG) 100 UNIT/ML injection Inject 0-9 Units into the skin 3 (three) times daily with meals. Patient not taking: Reported on 06/03/2020 12/16/18 06/04/20  Lanae Boast, MD    Thalia Bloodgood DO  Internal Medicine Resident PGY-3 [place]  Pager: [phone]

## 2022-09-20 NOTE — Progress Notes (Signed)
09/20/2022 Resident [name] [name] and I saw and evaluated the patient. Discussed with them and agree with their findings and plan as documented in the their note.  I have seen and evaluated the patient for acute hypoxic   S:  No acute issues  Minimal vent settings   O: Blood pressure 107/67, pulse 70, temperature 99.7 F (37.6 C), resp. rate (!) 28, height 5\' 6"  (1.676 m), weight 85.7 kg, SpO2 100 %.   Exam: Gen:      Intubated  HEENT:  tracking, sclera anicteric Lungs:     equal chest rise, ctab CV:         RRR; no murmurs Abd:      + bowel sounds; soft, non-tender; no palpable masses, no distension Ext:    trace edema; adequate peripheral perfusion Skin:       Warm and dry; no rash Neuro:    follows commands   Chest tube without air leak on suction  A:  # Acute hypoxic respiratory failure, multifactorial - AECOPD, volume overload # Suspected COPD exacerbation, rhinovirus infection # Acute on chronic systolic heart failure, underlying NICM, EF 20-25% # Right sided pneumothorax # hypotension, sedation related   P:  - steroids, doxy - SBT but would like to diurese more before extubation - see if we can get [name] [name] off of versed shooting for rass 0 to -1 - wean levo for map 65, check coox - keep chest tube to suction -20, we'll work toward removal after extubation  Patient critically ill due to acute hypoxic respiratory failure Interventions to address this today oversight of analgesosedation Risk of deterioration without these interventions is high  I personally spent 36 minutes providing critical care not including any separately billable procedures   [facility]/Critical Care

## 2022-09-21 DIAGNOSIS — J441 Chronic obstructive pulmonary disease with (acute) exacerbation: Secondary | ICD-10-CM | POA: Diagnosis not present

## 2022-09-21 LAB — GLUCOSE, CAPILLARY
Glucose-Capillary: 247 mg/dL — ABNORMAL HIGH (ref 70–99)
Glucose-Capillary: 207 mg/dL — ABNORMAL HIGH (ref 70–99)
Glucose-Capillary: 232 mg/dL — ABNORMAL HIGH (ref 70–99)
Glucose-Capillary: 235 mg/dL — ABNORMAL HIGH (ref 70–99)
Glucose-Capillary: 179 mg/dL — ABNORMAL HIGH (ref 70–99)

## 2022-09-21 LAB — BASIC METABOLIC PANEL
Anion gap: 14 (ref 5–15)
BUN: 51 mg/dL — ABNORMAL HIGH (ref 6–20)
CO2: 31 mmol/L (ref 22–32)
Calcium: 8.3 mg/dL — ABNORMAL LOW (ref 8.9–10.3)
Chloride: 93 mmol/L — ABNORMAL LOW (ref 98–111)
Creatinine, Ser: 1.22 mg/dL — ABNORMAL HIGH (ref 0.44–1.00)
GFR, Estimated: 53 mL/min — ABNORMAL LOW (ref >60)
Glucose, Bld: 245 mg/dL — ABNORMAL HIGH (ref 70–99)
Potassium: 3.5 mmol/L (ref 3.5–5.1)
Sodium: 138 mmol/L (ref 135–145)

## 2022-09-21 LAB — CBC
HCT: 32.9 % — ABNORMAL LOW (ref 36.0–46.0)
Hemoglobin: 10.8 g/dL — ABNORMAL LOW (ref 12.0–15.0)
MCH: 29.2 pg (ref 26.0–34.0)
MCHC: 32.8 g/dL (ref 30.0–36.0)
MCV: 88.9 fL (ref 80.0–100.0)
Platelets: 156 10*3/uL (ref 150–400)
RBC: 3.7 MIL/uL — ABNORMAL LOW (ref 3.87–5.11)
RDW: 14.7 % (ref 11.5–15.5)
WBC: 9 10*3/uL (ref 4.0–10.5)
nRBC: 0 % (ref 0.0–0.2)

## 2022-09-21 LAB — PHOSPHORUS: Phosphorus: 3.6 mg/dL (ref 2.5–4.6)

## 2022-09-21 LAB — MAGNESIUM: Magnesium: 2.2 mg/dL (ref 1.7–2.4)

## 2022-09-21 MED ORDER — ROSUVASTATIN CALCIUM 20 MG PO TABS
20.0000 mg | ORAL_TABLET | Freq: Every day | ORAL | Status: DC
  Administered 2022-09-22 – 2022-09-30 (×9): 20 mg via ORAL
  Filled 2022-09-21 (×9): qty 1

## 2022-09-21 MED ORDER — CLONAZEPAM 0.25 MG PO TBDP: 1.0000 mg | ORAL_TABLET | Freq: Three times a day (TID) | ORAL | Status: DC | PRN

## 2022-09-21 MED ORDER — ADULT MULTIVITAMIN W/MINERALS CH
1.0000 | ORAL_TABLET | Freq: Every day | ORAL | Status: DC
  Administered 2022-09-22 – 2022-09-30 (×9): 1 via ORAL
  Filled 2022-09-21 (×9): qty 1

## 2022-09-21 MED ORDER — PHENOL 1.4 % MT LIQD
1.0000 | Freq: Once | OROMUCOSAL | Status: AC
  Administered 2022-09-21: 1 via OROMUCOSAL
  Filled 2022-09-21: qty 177

## 2022-09-21 MED ORDER — FAMOTIDINE 20 MG PO TABS
20.0000 mg | ORAL_TABLET | Freq: Every day | ORAL | Status: DC
  Administered 2022-09-22 – 2022-09-29 (×8): 20 mg via ORAL
  Filled 2022-09-21 (×8): qty 1

## 2022-09-21 MED ORDER — OXYCODONE HCL 5 MG PO TABS
10.0000 mg | ORAL_TABLET | Freq: Four times a day (QID) | ORAL | Status: DC | PRN
  Administered 2022-09-22: 10 mg via ORAL
  Filled 2022-09-21: qty 2

## 2022-09-21 MED ORDER — ORAL CARE MOUTH RINSE
15.0000 mL | OROMUCOSAL | Status: DC
  Administered 2022-09-21: 15 mL via OROMUCOSAL

## 2022-09-21 MED ORDER — ASPIRIN 81 MG PO CHEW
81.0000 mg | CHEWABLE_TABLET | Freq: Every day | ORAL | Status: DC
  Administered 2022-09-22 – 2022-09-30 (×9): 81 mg via ORAL
  Filled 2022-09-21 (×9): qty 1

## 2022-09-21 MED ORDER — THIAMINE MONONITRATE 100 MG PO TABS
100.0000 mg | ORAL_TABLET | Freq: Every day | ORAL | Status: DC
  Administered 2022-09-22 – 2022-09-30 (×9): 100 mg via ORAL
  Filled 2022-09-21 (×9): qty 1

## 2022-09-21 MED ORDER — RISPERIDONE 1 MG PO TABS
1.0000 mg | ORAL_TABLET | Freq: Every day | ORAL | Status: DC
  Filled 2022-09-21: qty 1

## 2022-09-21 MED ORDER — SODIUM CHLORIDE 0.9 % IV SOLN: INTRAVENOUS | Status: DC | PRN

## 2022-09-21 MED ORDER — SENNA 8.6 MG PO TABS
1.0000 | ORAL_TABLET | Freq: Every day | ORAL | Status: DC
  Administered 2022-09-22 – 2022-09-28 (×5): 8.6 mg via ORAL
  Filled 2022-09-21 (×7): qty 1

## 2022-09-21 MED ORDER — POTASSIUM CHLORIDE 20 MEQ PO PACK
40.0000 meq | PACK | Freq: Once | ORAL | Status: AC
  Administered 2022-09-21: 40 meq
  Filled 2022-09-21: qty 2

## 2022-09-21 MED ORDER — FOLIC ACID 1 MG PO TABS
1.0000 mg | ORAL_TABLET | Freq: Every day | ORAL | Status: DC
  Administered 2022-09-22 – 2022-09-30 (×9): 1 mg via ORAL
  Filled 2022-09-21 (×9): qty 1

## 2022-09-21 MED ORDER — RISPERIDONE 2 MG PO TABS: 2.0000 mg | ORAL_TABLET | Freq: Every day | ORAL | Status: DC

## 2022-09-21 MED ORDER — FUROSEMIDE 10 MG/ML IJ SOLN
80.0000 mg | Freq: Three times a day (TID) | INTRAMUSCULAR | Status: AC
  Administered 2022-09-21 (×2): 80 mg via INTRAVENOUS
  Filled 2022-09-21 (×2): qty 8

## 2022-09-21 NOTE — Consult Note (Addendum)
NAME:  [name] [name], MRN:  [id number], DOB:  1971-04-04, LOS: 4 ADMISSION DATE:  09/16/2022, CONSULTATION DATE: 14 2024 REFERRING MD: Triad, CHIEF COMPLAINT: Acute respiratory failure  History of Present Illness:  52 year old female with extensive past medical history is well-documented below pulmonary critical care called to bedside for acute exacerbation of COPD and acute respiratory failure.  She was transferred to intensive care unit urgently intubated.  Chest x-ray shows proper placement of NG tube and endotracheal tube.  She will be maintained in the intensive care unit until stabilized.  Pertinent  Medical History   Past Medical History:  Diagnosis Date   Anxiety disorder    Asthma    Bipolar affective disorder    Blood transfusion without reported diagnosis    CHF (congestive heart failure)    COPD (chronic obstructive pulmonary disease)    Depression    Diabetes mellitus    Migraine    Schizophrenia     Significant Hospital Events: Including procedures, antibiotic start and stop dates in addition to other pertinent events   4/10 transferred to ICU for respiratory failure 4/10 intubated, central line placed -> right sided pneumothorax and chest tube placed  Interim History / Subjective:   Good UOP overnight, looks great on SBT  Objective   Blood pressure (!) 93/58, pulse 65, temperature 99 F (37.2 C), resp. rate 19, height 5\' 6"  (1.676 m), weight 85.2 kg, SpO2 98 %. CVP:  [3 mmHg-9 mmHg] 3 mmHg  Vent Mode: PSV;CPAP FiO2 (%):  [21 %-40 %] 21 % Set Rate:  [28 bmp] 28 bmp Vt Set:  [460 mL] 460 mL PEEP:  [8 cmH20] 8 cmH20 Pressure Support:  [8 cmH20] 8 cmH20 Plateau Pressure:  [15 cmH20-21 cmH20] 21 cmH20   Intake/Output Summary (Last 24 hours) at 09/21/2022 1805 Last data filed at 09/21/2022 1700 Gross per 24 hour  Intake 2796.6 ml  Output 3023 ml  Net -226.4 ml   Filed Weights   09/18/22 0752 09/20/22 0500 09/21/22 0500  Weight: 83.6 kg 85.7 kg 85.2 kg    Examination: General: intubated HENT: ET tube in place. Right IJ CVC in plane Lungs: mech breath sounds bl. Right chest tube in place without air leak on suction Cardiovascular: RRR no murmurs/gallops/rubs Abdomen: soft, non-distended Extremities: warm, trace  edema Neuro: follows commands  Labs/imaging reviewed   Assessment & Plan:   # Acute hypoxic respiratory failure, multifactorial - AECOPD, volume overload # Suspected COPD exacerbation, rhinovirus infection # Acute on chronic systolic heart failure, underlying NICM, EF 20-25% # Right sided pneumothorax following central line placement # hypotension, sedation related    P:  - finish steroid, doxy course, continue bronchodilators - extubate - lasix 80 IV bid today - keep chest tube to suction -20, we'll work toward removal after extubation  Best Practice (right click and "Reselect all SmartList Selections" daily)   Diet/type: OG tube DVT prophylaxis: LMWH GI prophylaxis: PPI Lines: central line Foley:  will likely need foley Code Status:  full code   Labs   CBC: Recent Labs  Lab 09/16/22 2120 09/17/22 0313 09/17/22 0333 09/18/22 0924 09/18/22 1200 09/19/22 0529 09/20/22 1033 09/21/22 0320  WBC 6.0 7.0  --   --   --  10.6* 7.6 9.0  NEUTROABS 3.6 6.5  --   --   --  9.1* 5.5  --   HGB 12.2 11.1*   < > 12.2 10.9* 10.2* 10.2* 10.8*  HCT 38.0 36.7   < > 36.0 32.0*  33.4* 32.0* 32.9*  MCV 89.0 91.8  --   --   --  92.3 90.7 88.9  PLT 195 172  --   --   --  166 142* 156   < > = values in this interval not displayed.    Basic Metabolic Panel: Recent Labs  Lab 09/17/22 0313 09/17/22 0333 09/18/22 0003 09/18/22 0924 09/18/22 1200 09/18/22 1755 09/19/22 0529 09/19/22 1813 09/20/22 0325 09/21/22 0321  NA 134*   < > 135 136 137  --  134*  --  137 138  K 3.9   < > 3.3* 4.8 4.6  --  4.8  --  4.7 3.5  CL 94*  --  100  --   --   --  101  --  102 93*  CO2 26  --  28  --   --   --  26  --  26 31  GLUCOSE 506*   --  92  --   --   --  201*  --  117* 245*  BUN 20  --  32*  --   --   --  43*  --  51* 51*  CREATININE 1.47*  --  1.58*  --   --   --  1.29*  --  1.27* 1.22*  CALCIUM 8.1*  --  7.8*  --   --   --  8.1*  --  8.1* 8.3*  MG 1.8  --  2.1  --   --  2.2 2.1 2.4 2.2 2.2  PHOS 4.1  --   --   --   --  3.4 5.0* 4.8* 4.5 3.6   < > = values in this interval not displayed.   GFR: Estimated Creatinine Clearance: 58.7 mL/min (A) (by C-G formula based on SCr of 1.22 mg/dL (H)). Recent Labs  Lab 09/17/22 0313 09/19/22 0529 09/20/22 1033 09/21/22 0320  PROCALCITON <0.10  --   --   --   WBC 7.0 10.6* 7.6 9.0    Liver Function Tests: Recent Labs  Lab 09/16/22 2120 09/17/22 0313 09/20/22 0325  AST 19 21 66*  ALT 17 16 83*  ALKPHOS 90 93 106  BILITOT 0.7 0.4 0.9  PROT 6.2* 5.9* 5.2*  ALBUMIN 3.5 3.3* 2.9*   No results for input(s): "LIPASE", "AMYLASE" in the last 168 hours. No results for input(s): "AMMONIA" in the last 168 hours.  ABG    Component Value Date/Time   PHART 7.445 09/18/2022 1200   PCO2ART 36.1 09/18/2022 1200   PO2ART 116 (H) 09/18/2022 1200   HCO3 24.7 09/18/2022 1200   TCO2 26 09/18/2022 1200   ACIDBASEDEF 1.0 09/18/2022 0924   O2SAT 69.3 09/20/2022 1033     Coagulation Profile: No results for input(s): "INR", "PROTIME" in the last 168 hours.  Cardiac Enzymes: No results for input(s): "CKTOTAL", "CKMB", "CKMBINDEX", "TROPONINI" in the last 168 hours.  HbA1C: Hgb A1c MFr Bld  Date/Time Value Ref Range Status  08/27/2022 03:41 AM 11.0 (H) 4.8 - 5.6 % Final    Comment:    (NOTE)         Prediabetes: 5.7 - 6.4         Diabetes: >6.4         Glycemic control for adults with diabetes: <7.0   05/16/2022 01:10 AM 8.1 (H) 4.8 - 5.6 % Final    Comment:    (NOTE)         Prediabetes: 5.7 - 6.4  Diabetes: >6.4         Glycemic control for adults with diabetes: <7.0     CBG: Recent Labs  Lab 09/20/22 2325 09/21/22 0409 09/21/22 0809 09/21/22 1124  09/21/22 1526  GLUCAP 96 247* 207* 232* 235*    Review of Systems:   na  Past Medical History:  She,  has a past medical history of Anxiety disorder, Asthma, Bipolar affective disorder, Blood transfusion without reported diagnosis, CHF (congestive heart failure), COPD (chronic obstructive pulmonary disease), Depression, Diabetes mellitus, Migraine, and Schizophrenia.   Surgical History:   Past Surgical History:  Procedure Laterality Date   KNEE ARTHROSCOPY     x 2   KNEE SURGERY Left    Incision made and knee cleaned out   TUBAL LIGATION       Social History:   reports that she has been smoking cigarettes. She has a 29.00 pack-year smoking history. She has never used smokeless tobacco. She reports that she does not currently use alcohol. She reports that she does not use drugs.   Family History:  Her family history includes Bipolar disorder in her mother; Diabetes in her father; Hypertension in her father.   Allergies Allergies  Allergen Reactions   Tramadol Nausea Only and Rash   Benadryl [Diphenhydramine]     Decreases motor skills and increases anxiety   Wellbutrin [Bupropion] Other (See Comments)    rage   Nicotine Rash    Patient states she is allergic to the Nicotine patch and they cause her to break out in a rash.  She states she can smoke cigarettes.     Home Medications  Prior to Admission medications   Medication Sig Start Date End Date Taking? Authorizing Provider  acetaminophen (TYLENOL) 325 MG tablet Take 1 tablet (325 mg total) by mouth every 6 (six) hours as needed for headache or mild pain. 09/02/22  Yes Wouk, Wilfred Curtis, MD  ASPIRIN LOW DOSE 81 MG tablet Take 1 tablet (81 mg total) by mouth daily. 08/23/22  Yes Gherghe, Daylene Katayama, MD  bumetanide (BUMEX) 2 MG tablet Take 1 tablet (2 mg total) by mouth 2 (two) times daily. 08/29/22 09/28/22 Yes Arrien, York Ram, MD  insulin glargine, 1 Unit Dial, (TOUJEO SOLOSTAR) 300 UNIT/ML Solostar Pen Inject 24  Units into the skin at bedtime. 09/02/22 10/02/22 Yes Wouk, Wilfred Curtis, MD  isosorbide mononitrate (IMDUR) 60 MG 24 hr tablet Take 1 tablet (60 mg total) by mouth daily. 08/23/22 09/22/22 Yes Gherghe, Daylene Katayama, MD  metoprolol succinate (TOPROL-XL) 25 MG 24 hr tablet Take 1 tablet (25 mg total) by mouth daily. 08/23/22 09/22/22 Yes Gherghe, Daylene Katayama, MD  nitroGLYCERIN (NITROSTAT) 0.4 MG SL tablet Place 1 tablet (0.4 mg total) under the tongue every 5 (five) minutes as needed for chest pain. 08/23/22  Yes Gherghe, Daylene Katayama, MD  oxyCODONE-acetaminophen (PERCOCET) 10-325 MG tablet Take 1 tablet by mouth every 6 (six) hours as needed for pain. 09/02/22  Yes Wouk, Wilfred Curtis, MD  potassium chloride SA (KLOR-CON M) 20 MEQ tablet Take 1 tablet (20 mEq total) by mouth daily. Take with bumetanide. 08/29/22  Yes Arrien, York Ram, MD  risperiDONE (RISPERDAL) 2 MG tablet Take one-half tablet in the morning and one tablet in the evening. 08/23/22  Yes Gherghe, Daylene Katayama, MD  rosuvastatin (CRESTOR) 20 MG tablet Take 1 tablet (20 mg total) by mouth daily. 08/30/22 09/29/22 Yes Arrien, York Ram, MD  blood glucose meter kit and supplies Dispense based on patient and insurance  preference. Use up to four times daily as directed. (FOR ICD-10 E10.9, E11.9). 01/06/18   Rodolph Bong, MD  Insulin Pen Needle 32G X 4 MM MISC Inject 1 each into the skin in the morning. 09/02/22   Wouk, Wilfred Curtis, MD  methimazole (TAPAZOLE) 5 MG tablet Take 1 tablet (5 mg total) by mouth daily for 21 days. 08/29/22 09/19/22  Arrien, York Ram, MD  benztropine (COGENTIN) 1 MG tablet Take 1 tablet (1 mg total) by mouth 2 (two) times daily. 12/23/18 06/04/20  Malvin Johns, MD  insulin aspart (NOVOLOG) 100 UNIT/ML injection Inject 0-9 Units into the skin 3 (three) times daily with meals. Patient not taking: Reported on 06/03/2020 12/16/18 06/04/20  Lanae Boast, MD    CC time 35 minutes  [facility]/Critical  Care

## 2022-09-21 NOTE — Progress Notes (Addendum)
eLink Physician-Brief Progress Note Patient Name: [name] [name] DOB: May 23, 1971 MRN: [id number]   Date of Service  09/21/2022  HPI/Events of Note  COPD, s/p extubation earlier having throat pain. OG also pulled out. Already on diet, no issues per RN discussions  eICU Interventions  Chloraseptic spray ordered for now     Intervention Category Intermediate Interventions: Pain - evaluation and management  Ranee Gosselin 09/21/2022, 9:30 PM  22:03 can you please change scheduled tube meds for tonight to be taken in pill form since pt is taking PO's including tylenol    PM meds-- pepcid, oxycodone, risperdal, senokot.  Changes made.

## 2022-09-22 ENCOUNTER — Inpatient Hospital Stay (HOSPITAL_COMMUNITY): Payer: BLUE CROSS/BLUE SHIELD

## 2022-09-22 DIAGNOSIS — J441 Chronic obstructive pulmonary disease with (acute) exacerbation: Secondary | ICD-10-CM | POA: Diagnosis not present

## 2022-09-22 LAB — GLUCOSE, CAPILLARY
Glucose-Capillary: 102 mg/dL — ABNORMAL HIGH (ref 70–99)
Glucose-Capillary: 130 mg/dL — ABNORMAL HIGH (ref 70–99)
Glucose-Capillary: 152 mg/dL — ABNORMAL HIGH (ref 70–99)
Glucose-Capillary: 181 mg/dL — ABNORMAL HIGH (ref 70–99)
Glucose-Capillary: 217 mg/dL — ABNORMAL HIGH (ref 70–99)
Glucose-Capillary: 92 mg/dL (ref 70–99)
Glucose-Capillary: 98 mg/dL (ref 70–99)

## 2022-09-22 LAB — HEPATIC FUNCTION PANEL
ALT: 67 U/L — ABNORMAL HIGH (ref 0–44)
AST: 22 U/L (ref 15–41)
Albumin: 2.7 g/dL — ABNORMAL LOW (ref 3.5–5.0)
Alkaline Phosphatase: 97 U/L (ref 38–126)
Bilirubin, Direct: <0.1 mg/dL (ref 0.0–0.2)
Total Bilirubin: 0.7 mg/dL (ref 0.3–1.2)
Total Protein: 5.6 g/dL — ABNORMAL LOW (ref 6.5–8.1)

## 2022-09-22 LAB — BASIC METABOLIC PANEL
Anion gap: 11 (ref 5–15)
BUN: 39 mg/dL — ABNORMAL HIGH (ref 6–20)
CO2: 33 mmol/L — ABNORMAL HIGH (ref 22–32)
Calcium: 8.2 mg/dL — ABNORMAL LOW (ref 8.9–10.3)
Chloride: 93 mmol/L — ABNORMAL LOW (ref 98–111)
Creatinine, Ser: 1.02 mg/dL — ABNORMAL HIGH (ref 0.44–1.00)
GFR, Estimated: >60 mL/min (ref >60)
Glucose, Bld: 102 mg/dL — ABNORMAL HIGH (ref 70–99)
Potassium: 3.1 mmol/L — ABNORMAL LOW (ref 3.5–5.1)
Sodium: 137 mmol/L (ref 135–145)

## 2022-09-22 MED ORDER — INSULIN GLARGINE-YFGN 100 UNIT/ML ~~LOC~~ SOLN
18.0000 [IU] | Freq: Two times a day (BID) | SUBCUTANEOUS | Status: DC
  Administered 2022-09-22 – 2022-09-23 (×2): 18 [IU] via SUBCUTANEOUS
  Filled 2022-09-22 (×3): qty 0.18

## 2022-09-22 MED ORDER — POTASSIUM CHLORIDE 10 MEQ/50ML IV SOLN
10.0000 meq | INTRAVENOUS | Status: AC
  Administered 2022-09-22 (×4): 10 meq via INTRAVENOUS
  Filled 2022-09-22 (×4): qty 50

## 2022-09-22 MED ORDER — OXYCODONE HCL 5 MG PO TABS
5.0000 mg | ORAL_TABLET | ORAL | Status: DC | PRN
  Administered 2022-09-22 – 2022-09-30 (×39): 5 mg via ORAL
  Filled 2022-09-22 (×39): qty 1

## 2022-09-22 MED ORDER — IPRATROPIUM-ALBUTEROL 0.5-2.5 (3) MG/3ML IN SOLN
3.0000 mL | Freq: Four times a day (QID) | RESPIRATORY_TRACT | Status: DC | PRN
  Administered 2022-09-22 – 2022-09-29 (×2): 3 mL via RESPIRATORY_TRACT
  Filled 2022-09-22 (×3): qty 3

## 2022-09-22 MED ORDER — IPRATROPIUM-ALBUTEROL 0.5-2.5 (3) MG/3ML IN SOLN
3.0000 mL | Freq: Once | RESPIRATORY_TRACT | Status: AC | PRN
  Administered 2022-09-22: 3 mL via RESPIRATORY_TRACT

## 2022-09-22 MED ORDER — POTASSIUM CHLORIDE CRYS ER 20 MEQ PO TBCR
20.0000 meq | EXTENDED_RELEASE_TABLET | ORAL | Status: AC
  Administered 2022-09-22 (×2): 20 meq via ORAL
  Filled 2022-09-22 (×2): qty 1

## 2022-09-22 MED ORDER — BUMETANIDE 2 MG PO TABS
2.0000 mg | ORAL_TABLET | Freq: Every day | ORAL | Status: DC
  Administered 2022-09-22 – 2022-09-30 (×9): 2 mg via ORAL
  Filled 2022-09-22 (×9): qty 1

## 2022-09-22 MED ORDER — INSULIN ASPART 100 UNIT/ML IJ SOLN
0.0000 [IU] | Freq: Every day | INTRAMUSCULAR | Status: DC
  Administered 2022-09-24: 2 [IU] via SUBCUTANEOUS
  Administered 2022-09-26: 3 [IU] via SUBCUTANEOUS

## 2022-09-22 MED ORDER — BISACODYL 5 MG PO TBEC
5.0000 mg | DELAYED_RELEASE_TABLET | Freq: Every day | ORAL | Status: DC | PRN
  Administered 2022-09-22: 5 mg via ORAL
  Filled 2022-09-22 (×2): qty 1

## 2022-09-22 MED ORDER — INSULIN ASPART 100 UNIT/ML IJ SOLN
0.0000 [IU] | Freq: Three times a day (TID) | INTRAMUSCULAR | Status: DC
  Administered 2022-09-22: 3 [IU] via SUBCUTANEOUS
  Administered 2022-09-22 – 2022-09-23 (×2): 2 [IU] via SUBCUTANEOUS
  Administered 2022-09-23: 8 [IU] via SUBCUTANEOUS
  Administered 2022-09-23: 11 [IU] via SUBCUTANEOUS
  Administered 2022-09-24: 8 [IU] via SUBCUTANEOUS
  Administered 2022-09-24 – 2022-09-25 (×3): 5 [IU] via SUBCUTANEOUS
  Administered 2022-09-25: 15 [IU] via SUBCUTANEOUS
  Administered 2022-09-25 – 2022-09-26 (×2): 3 [IU] via SUBCUTANEOUS
  Administered 2022-09-26: 15 [IU] via SUBCUTANEOUS
  Administered 2022-09-27 (×2): 2 [IU] via SUBCUTANEOUS
  Administered 2022-09-27: 5 [IU] via SUBCUTANEOUS
  Administered 2022-09-28 (×2): 3 [IU] via SUBCUTANEOUS
  Administered 2022-09-29 – 2022-09-30 (×3): 2 [IU] via SUBCUTANEOUS

## 2022-09-22 MED ORDER — RISPERIDONE 2 MG PO TABS
2.0000 mg | ORAL_TABLET | Freq: Every day | ORAL | Status: DC
  Administered 2022-09-22 – 2022-09-29 (×8): 2 mg via ORAL
  Filled 2022-09-22 (×10): qty 1

## 2022-09-22 MED ORDER — ONDANSETRON HCL 4 MG/2ML IJ SOLN
4.0000 mg | Freq: Four times a day (QID) | INTRAMUSCULAR | Status: DC | PRN
  Administered 2022-09-22 – 2022-09-29 (×9): 4 mg via INTRAVENOUS
  Filled 2022-09-22 (×10): qty 2

## 2022-09-22 MED ORDER — RISPERIDONE 1 MG PO TABS
1.0000 mg | ORAL_TABLET | Freq: Every day | ORAL | Status: DC
  Administered 2022-09-22 – 2022-09-30 (×8): 1 mg via ORAL
  Filled 2022-09-22 (×9): qty 1

## 2022-09-22 NOTE — Progress Notes (Signed)
NAME:  RMANI BLEY, MRN:  [id number], DOB:  07/16/1970, LOS: 5 ADMISSION DATE:  09/16/2022, CONSULTATION DATE: 14 2024 REFERRING MD: Triad, CHIEF COMPLAINT: Acute respiratory failure  History of Present Illness:  52 year old female with extensive past medical history is well-documented below pulmonary critical care called to bedside for acute exacerbation of COPD and acute respiratory failure.  She was transferred to intensive care unit urgently intubated.  Chest x-ray shows proper placement of NG tube and endotracheal tube.  She will be maintained in the intensive care unit until stabilized.  Pertinent  Medical History   Past Medical History:  Diagnosis Date   Anxiety disorder    Asthma    Bipolar affective disorder    Blood transfusion without reported diagnosis    CHF (congestive heart failure)    COPD (chronic obstructive pulmonary disease)    Depression    Diabetes mellitus    Migraine    Schizophrenia     Significant Hospital Events: Including procedures, antibiotic start and stop dates in addition to other pertinent events   4/10 transferred to ICU for respiratory failure 4/10 intubated, central line placed -> right sided pneumothorax and chest tube placed  Interim History / Subjective:   Extubated  Intermittently agitated and then excessively sedated following POs for pain   Titrated up to 6L HFNC   Objective   Blood pressure 116/61, pulse 74, temperature 98.6 F (37 C), resp. rate (!) 21, height 5\' 6"  (1.676 m), weight 84.3 kg, SpO2 (!) 87 %. CVP:  [3 mmHg-6 mmHg] 3 mmHg  FiO2 (%):  [21 %-40 %] 36 %   Intake/Output Summary (Last 24 hours) at 09/22/2022 1126 Last data filed at 09/22/2022 0900 Gross per 24 hour  Intake 1298.87 ml  Output 2991 ml  Net -1692.13 ml   Filed Weights   09/20/22 0500 09/21/22 0500 09/22/22 0448  Weight: 85.7 kg 85.2 kg 84.3 kg   Examination: General: chronically ill appearing Lungs: diminished bl. Right chest tube in place  without air leak on suction Cardiovascular: RRR no murmurs/gallops/rubs Abdomen: soft, non-distended Extremities: warm, trace  edema Neuro: follows commands, grossly nonfocal, attends to conversation  Labs/imaging reviewed   Assessment & Plan:   # Acute metabolic encephalopathy, improving # Acute hypoxic respiratory failure, multifactorial - AECOPD, volume overload # Suspected COPD exacerbation, rhinovirus infection # Acute on chronic systolic heart failure, underlying NICM, EF 20-25% # Right sided pneumothorax following central line placement # hypotension, sedation related  # Schizophrenia   P:  - continue bronchodilators - transition to bumex 2 mg bid (home diuretic) - check CXR today, if R lung remains up then will clamp and consider removal later today or tomorrow. Can ramp up pulm hygiene a bit if pneumothorax resolved. - wean precedex  - decrease PO narcotics - wean O2 for saturation 92%  Best Practice (right click and "Reselect all SmartList Selections" daily)   Diet/type: OG tube DVT prophylaxis: LMWH GI prophylaxis: PPI Lines: central line - discontinue today Foley:  discontinue today Code Status:  full code   Labs   CBC: Recent Labs  Lab 09/16/22 2120 09/17/22 0313 09/17/22 0333 09/18/22 0924 09/18/22 1200 09/19/22 0529 09/20/22 1033 09/21/22 0320  WBC 6.0 7.0  --   --   --  10.6* 7.6 9.0  NEUTROABS 3.6 6.5  --   --   --  9.1* 5.5  --   HGB 12.2 11.1*   < > 12.2 10.9* 10.2* 10.2* 10.8*  HCT 38.0 36.7   < >  36.0 32.0* 33.4* 32.0* 32.9*  MCV 89.0 91.8  --   --   --  92.3 90.7 88.9  PLT 195 172  --   --   --  166 142* 156   < > = values in this interval not displayed.    Basic Metabolic Panel: Recent Labs  Lab 09/18/22 0003 09/18/22 0924 09/18/22 1200 09/18/22 1755 09/19/22 0529 09/19/22 1813 09/20/22 0325 09/21/22 0321 09/22/22 0433  NA 135   < > 137  --  134*  --  137 138 137  K 3.3*   < > 4.6  --  4.8  --  4.7 3.5 3.1*  CL 100  --   --    --  101  --  102 93* 93*  CO2 28  --   --   --  26  --  26 31 33*  GLUCOSE 92  --   --   --  201*  --  117* 245* 102*  BUN 32*  --   --   --  43*  --  51* 51* 39*  CREATININE 1.58*  --   --   --  1.29*  --  1.27* 1.22* 1.02*  CALCIUM 7.8*  --   --   --  8.1*  --  8.1* 8.3* 8.2*  MG 2.1  --   --  2.2 2.1 2.4 2.2 2.2  --   PHOS  --   --   --  3.4 5.0* 4.8* 4.5 3.6  --    < > = values in this interval not displayed.   GFR: Estimated Creatinine Clearance: 69.8 mL/min (A) (by C-G formula based on SCr of 1.02 mg/dL (H)). Recent Labs  Lab 09/17/22 0313 09/19/22 0529 09/20/22 1033 09/21/22 0320  PROCALCITON <0.10  --   --   --   WBC 7.0 10.6* 7.6 9.0    Liver Function Tests: Recent Labs  Lab 09/16/22 2120 09/17/22 0313 09/20/22 0325 09/22/22 0433  AST 19 21 66* 22  ALT 17 16 83* 67*  ALKPHOS 90 93 106 97  BILITOT 0.7 0.4 0.9 0.7  PROT 6.2* 5.9* 5.2* 5.6*  ALBUMIN 3.5 3.3* 2.9* 2.7*   No results for input(s): "LIPASE", "AMYLASE" in the last 168 hours. No results for input(s): "AMMONIA" in the last 168 hours.  ABG    Component Value Date/Time   PHART 7.445 09/18/2022 1200   PCO2ART 36.1 09/18/2022 1200   PO2ART 116 (H) 09/18/2022 1200   HCO3 24.7 09/18/2022 1200   TCO2 26 09/18/2022 1200   ACIDBASEDEF 1.0 09/18/2022 0924   O2SAT 69.3 09/20/2022 1033     Coagulation Profile: No results for input(s): "INR", "PROTIME" in the last 168 hours.  Cardiac Enzymes: No results for input(s): "CKTOTAL", "CKMB", "CKMBINDEX", "TROPONINI" in the last 168 hours.  HbA1C: Hgb A1c MFr Bld  Date/Time Value Ref Range Status  08/27/2022 03:41 AM 11.0 (H) 4.8 - 5.6 % Final    Comment:    (NOTE)         Prediabetes: 5.7 - 6.4         Diabetes: >6.4         Glycemic control for adults with diabetes: <7.0   05/16/2022 01:10 AM 8.1 (H) 4.8 - 5.6 % Final    Comment:    (NOTE)         Prediabetes: 5.7 - 6.4         Diabetes: >6.4  Glycemic control for adults with diabetes:  <7.0     CBG: Recent Labs  Lab 09/21/22 1526 09/21/22 1933 09/22/22 0040 09/22/22 0411 09/22/22 0746  GLUCAP 235* 179* 98 92 102*    Review of Systems:   12 point review of systems is negative except as in HPI  Past Medical History:  She,  has a past medical history of Anxiety disorder, Asthma, Bipolar affective disorder, Blood transfusion without reported diagnosis, CHF (congestive heart failure), COPD (chronic obstructive pulmonary disease), Depression, Diabetes mellitus, Migraine, and Schizophrenia.   Surgical History:   Past Surgical History:  Procedure Laterality Date   KNEE ARTHROSCOPY     x 2   KNEE SURGERY Left    Incision made and knee cleaned out   TUBAL LIGATION       Social History:   reports that she has been smoking cigarettes. She has a 29.00 pack-year smoking history. She has never used smokeless tobacco. She reports that she does not currently use alcohol. She reports that she does not use drugs.   Family History:  Her family history includes Bipolar disorder in her mother; Diabetes in her father; Hypertension in her father.   Allergies Allergies  Allergen Reactions   Tramadol Nausea Only and Rash   Benadryl [Diphenhydramine]     Decreases motor skills and increases anxiety   Wellbutrin [Bupropion] Other (See Comments)    rage   Nicotine Rash    Patient states she is allergic to the Nicotine patch and they cause her to break out in a rash.  She states she can smoke cigarettes.     Home Medications  Prior to Admission medications   Medication Sig Start Date End Date Taking? Authorizing Provider  acetaminophen (TYLENOL) 325 MG tablet Take 1 tablet (325 mg total) by mouth every 6 (six) hours as needed for headache or mild pain. 09/02/22  Yes Wouk, Wilfred Curtis, MD  ASPIRIN LOW DOSE 81 MG tablet Take 1 tablet (81 mg total) by mouth daily. 08/23/22  Yes Gherghe, Daylene Katayama, MD  bumetanide (BUMEX) 2 MG tablet Take 1 tablet (2 mg total) by mouth 2 (two)  times daily. 08/29/22 09/28/22 Yes Arrien, York Ram, MD  insulin glargine, 1 Unit Dial, (TOUJEO SOLOSTAR) 300 UNIT/ML Solostar Pen Inject 24 Units into the skin at bedtime. 09/02/22 10/02/22 Yes Wouk, Wilfred Curtis, MD  isosorbide mononitrate (IMDUR) 60 MG 24 hr tablet Take 1 tablet (60 mg total) by mouth daily. 08/23/22 09/22/22 Yes Gherghe, Daylene Katayama, MD  metoprolol succinate (TOPROL-XL) 25 MG 24 hr tablet Take 1 tablet (25 mg total) by mouth daily. 08/23/22 09/22/22 Yes Gherghe, Daylene Katayama, MD  nitroGLYCERIN (NITROSTAT) 0.4 MG SL tablet Place 1 tablet (0.4 mg total) under the tongue every 5 (five) minutes as needed for chest pain. 08/23/22  Yes Gherghe, Daylene Katayama, MD  oxyCODONE-acetaminophen (PERCOCET) 10-325 MG tablet Take 1 tablet by mouth every 6 (six) hours as needed for pain. 09/02/22  Yes Wouk, Wilfred Curtis, MD  potassium chloride SA (KLOR-CON M) 20 MEQ tablet Take 1 tablet (20 mEq total) by mouth daily. Take with bumetanide. 08/29/22  Yes Arrien, York Ram, MD  risperiDONE (RISPERDAL) 2 MG tablet Take one-half tablet in the morning and one tablet in the evening. 08/23/22  Yes Gherghe, Daylene Katayama, MD  rosuvastatin (CRESTOR) 20 MG tablet Take 1 tablet (20 mg total) by mouth daily. 08/30/22 09/29/22 Yes Arrien, York Ram, MD  blood glucose meter kit and supplies Dispense based on patient and insurance  preference. Use up to four times daily as directed. (FOR ICD-10 E10.9, E11.9). 01/06/18   Rodolph Bong, MD  Insulin Pen Needle 32G X 4 MM MISC Inject 1 each into the skin in the morning. 09/02/22   Wouk, Wilfred Curtis, MD  methimazole (TAPAZOLE) 5 MG tablet Take 1 tablet (5 mg total) by mouth daily for 21 days. 08/29/22 09/19/22  Arrien, York Ram, MD  benztropine (COGENTIN) 1 MG tablet Take 1 tablet (1 mg total) by mouth 2 (two) times daily. 12/23/18 06/04/20  Malvin Johns, MD  insulin aspart (NOVOLOG) 100 UNIT/ML injection Inject 0-9 Units into the skin 3 (three) times daily with  meals. Patient not taking: Reported on 06/03/2020 12/16/18 06/04/20  Lanae Boast, MD    CC time 33 minutes  [facility]/Critical [facility]

## 2022-09-22 NOTE — Progress Notes (Signed)
[facility] ADULT ICU REPLACEMENT PROTOCOL   The patient does apply for the [facility] Adult ICU Electrolyte Replacment Protocol based on the criteria listed below:   1.Exclusion criteria: TCTS, ECMO, Dialysis, and Myasthenia Gravis patients 2. Is GFR >/= 30 ml/min? Yes.    Patient's GFR today is >60 3. Is SCr </= 2? Yes.   Patient's SCr is 1.02 mg/dL 4. Did SCr increase >/= 0.5 in 24 hours? No. 5.Pt's weight >40kg  Yes.   6. Abnormal electrolyte(s): K+ 3.1  7. Electrolytes replaced per protocol 8.  Call MD STAT for K+ </= 2.5, Phos </= 1, or Mag </= 1 Physician:  Dr Duncan Dull, Lilia Argue 09/22/2022 5:57 AM

## 2022-09-23 ENCOUNTER — Inpatient Hospital Stay (HOSPITAL_COMMUNITY): Payer: BLUE CROSS/BLUE SHIELD

## 2022-09-23 DIAGNOSIS — I5023 Acute on chronic systolic (congestive) heart failure: Secondary | ICD-10-CM | POA: Diagnosis not present

## 2022-09-23 DIAGNOSIS — N179 Acute kidney failure, unspecified: Secondary | ICD-10-CM | POA: Diagnosis not present

## 2022-09-23 DIAGNOSIS — E0590 Thyrotoxicosis, unspecified without thyrotoxic crisis or storm: Secondary | ICD-10-CM | POA: Diagnosis not present

## 2022-09-23 DIAGNOSIS — L8990 Pressure ulcer of unspecified site, unspecified stage: Secondary | ICD-10-CM

## 2022-09-23 DIAGNOSIS — J441 Chronic obstructive pulmonary disease with (acute) exacerbation: Secondary | ICD-10-CM | POA: Insufficient documentation

## 2022-09-23 LAB — GLUCOSE, CAPILLARY
Glucose-Capillary: 125 mg/dL — ABNORMAL HIGH (ref 70–99)
Glucose-Capillary: 201 mg/dL — ABNORMAL HIGH (ref 70–99)
Glucose-Capillary: 223 mg/dL — ABNORMAL HIGH (ref 70–99)
Glucose-Capillary: 242 mg/dL — ABNORMAL HIGH (ref 70–99)
Glucose-Capillary: 274 mg/dL — ABNORMAL HIGH (ref 70–99)
Glucose-Capillary: 301 mg/dL — ABNORMAL HIGH (ref 70–99)

## 2022-09-23 LAB — BASIC METABOLIC PANEL
Anion gap: 12 (ref 5–15)
BUN: 39 mg/dL — ABNORMAL HIGH (ref 6–20)
CO2: 30 mmol/L (ref 22–32)
Calcium: 8.4 mg/dL — ABNORMAL LOW (ref 8.9–10.3)
Chloride: 93 mmol/L — ABNORMAL LOW (ref 98–111)
Creatinine, Ser: 1.33 mg/dL — ABNORMAL HIGH (ref 0.44–1.00)
GFR, Estimated: 48 mL/min — ABNORMAL LOW (ref >60)
Glucose, Bld: 227 mg/dL — ABNORMAL HIGH (ref 70–99)
Potassium: 3.9 mmol/L (ref 3.5–5.1)
Sodium: 135 mmol/L (ref 135–145)

## 2022-09-23 MED ORDER — INSULIN GLARGINE-YFGN 100 UNIT/ML ~~LOC~~ SOLN
20.0000 [IU] | Freq: Two times a day (BID) | SUBCUTANEOUS | Status: DC
  Administered 2022-09-23 – 2022-09-24 (×2): 20 [IU] via SUBCUTANEOUS
  Filled 2022-09-23 (×3): qty 0.2

## 2022-09-23 MED ORDER — METHIMAZOLE 5 MG PO TABS
5.0000 mg | ORAL_TABLET | Freq: Every day | ORAL | Status: DC
  Administered 2022-09-23 – 2022-09-30 (×8): 5 mg via ORAL
  Filled 2022-09-23 (×8): qty 1

## 2022-09-23 NOTE — Progress Notes (Signed)
PROGRESS NOTE    [name] [name]  ERD:[id number] DOB: 07/02/1970 DOA: 09/16/2022 PCP: Patient, No Pcp Per    Chief Complaint  Patient presents with   Shortness of Breath    Brief Narrative:  Patient 52 year old female history of COPD, CHF, type 2 diabetes, CKD, anxiety, hypothyroidism presented with shortness of breath progressive with wheezing.  No PND no orthopnea no lower extremity edema.  Patient initially admitted placed on IV steroids, bronchodilator therapy and being treated for acute COPD exacerbation.  Also on presentation concern for acute on chronic systolic heart failure and patient placed on IV Lasix.  Patient also noted on presentation to be in acute kidney injury on CKD. -Patient's respiratory status worsened and noted to going acute respiratory failure and as such PCCM consulted.  Patient was urgently transferred to the ICU and urgently intubated.  Chest x-ray showed proper placement of NG tube and endotracheal tube.  Patient also had a central line placed that led to a right-sided pneumothorax and chest tube placed by PCCM. -Patient subsequently extubated and transferred to the hospitalist team 09/23/2022 with PCCM following for chest tube management.   Assessment & Plan:   Principal Problem:   Acute exacerbation of chronic obstructive pulmonary disease (COPD) Active Problems:   Acute on chronic systolic CHF (congestive heart failure)   Acute kidney injury superimposed on chronic kidney disease   Essential hypertension   Type 2 diabetes mellitus with hyperlipidemia   Hyperthyroidism   Bipolar 1 disorder   COPD exacerbation   Pressure injury of skin  #1 acute hypoxemic respiratory failure multifactorial secondary to acute COPD exacerbation acute on chronic systolic heart failure in the setting of rhinovirus infection and coronavirus NL 63  -Patient initially was being treated for acute COPD exacerbation and acute CHF exacerbation with IV steroids, nebulizer treatments,  IV Lasix. -Patient's respiratory status worsened and was transferred to the ICU emergently intubated. -Patient subsequently extubated successfully, currently on 6 L high flow nasal cannula. -Urine output of 1.775 L over the past 24 hours. -Patient is -1.9 L during this hospitalization. -Patient slowly clinically improving. -Continue Brovana, Pulmicort, Bumex,yupelri. -Supportive care. -PCCM following and appreciate input and recommendations.  2.  Right-sided pneumothorax following central line placement -Chest tube management per PCCM.  3.  History of schizophrenia/bipolar disorder -Currently stable. -Continue risperdal.  4.  Hyperlipidemia -Continue statin.  5.  Hyperthyroidism -Toprol-XL currently on hold. -Resume home regimen Tapazole.  6.  Diabetes mellitus type 2 -Hemoglobin A1c 11.0 (08/27/2022) -CBG 125 this morning, noted to go up to 301 at 1120 this morning. -Per med rec patient on 24 units of Toujeo at bedtime. -Increase Semglee to 20 units twice daily.  Continue SSI.  7.  AKI on CKD -Improved since admission. -Follow.  8.  Hypertension -BP soft. -Continue to hold antihypertensive medications.  9.  Pressure injury, POA Pressure Injury 09/17/22 Buttocks Left;Right Stage 1 -  Intact skin with non-blanchable redness of a localized area usually over a bony prominence. On both checks (Active)  09/17/22 2000  Location: Buttocks  Location Orientation: Left;Right  Staging: Stage 1 -  Intact skin with non-blanchable redness of a localized area usually over a bony prominence.  Wound Description (Comments): On both checks  Present on Admission: Yes         DVT prophylaxis: Lovenox Code Status: Full Family Communication: Updated patient.  No family at bedside. Disposition: Homelessness.  TBD.  Status is: Inpatient Remains inpatient appropriate because: Severity of illness   Consultants:  PCCM: Dr. Katrinka Blazing 09/18/2022  Procedures:    Significant Hospital  Events: Including procedures, antibiotic start and stop dates in addition to other pertinent events   4/10 transferred to ICU for respiratory failure 4/10 intubated, central line placed -> right sided pneumothorax and chest tube placed  Antimicrobials:  Anti-infectives (From admission, onward)    Start     Dose/Rate Route Frequency Ordered Stop   09/19/22 1115  doxycycline (VIBRAMYCIN) 100 mg in sodium chloride 0.9 % 250 mL IVPB        100 mg 125 mL/hr over 120 Minutes Intravenous Every 12 hours 09/19/22 1019 09/22/22 0026   09/18/22 1015  cefTRIAXone (ROCEPHIN) 2 g in sodium chloride 0.9 % 100 mL IVPB  Status:  Discontinued        2 g 200 mL/hr over 30 Minutes Intravenous Every 24 hours 09/18/22 0926 09/19/22 1017   09/18/22 1015  doxycycline (VIBRAMYCIN) 100 mg in sodium chloride 0.9 % 250 mL IVPB  Status:  Discontinued        100 mg 125 mL/hr over 120 Minutes Intravenous Every 12 hours 09/18/22 0926 09/19/22 1013   09/17/22 0030  doxycycline (VIBRAMYCIN) 100 mg in sodium chloride 0.9 % 250 mL IVPB  Status:  Discontinued       Note to Pharmacy: (In setting of acute copd exac)   100 mg 125 mL/hr over 120 Minutes Intravenous 2 times daily 09/17/22 0018 09/17/22 1527         Subjective: Patient laying in bed on 6 L high flow nasal cannula.  Feels shortness of breath is improving over the past 24 hours.  Denies any chest pain.  Some complaints of mid abdominal upper discomfort which she feels might be from the chest tube.  Some complaints of nausea.  No emesis.  Overall feels a little bit better than she did on admission.  Objective: Vitals:   09/23/22 1528 09/23/22 1600 09/23/22 1718 09/23/22 1800  BP:  (!) 92/57 125/68 117/85  Pulse:  94 85 84  Resp:  (!) 22 18 19   Temp: 97.9 F (36.6 C)     TempSrc: Oral     SpO2:  96% 97% 97%  Weight:      Height:        Intake/Output Summary (Last 24 hours) at 09/23/2022 1829 Last data filed at 09/23/2022 1700 Gross per 24 hour  Intake  1060 ml  Output 1640 ml  Net -580 ml   Filed Weights   09/21/22 0500 09/22/22 0448 09/23/22 0426  Weight: 85.2 kg 84.3 kg 83.4 kg    Examination:  General exam: NAD. Respiratory system: Shallow breaths.  No significant wheezing.  No significant crackles.  Decreased breath sounds in the bases.  Chest tube in place on the right.  Cardiovascular system: S1 & S2 heard, RRR. No JVD, murmurs, rubs, gallops or clicks. No pedal edema. Gastrointestinal system: Abdomen is nondistended, soft and nontender. No organomegaly or masses felt. Normal bowel sounds heard. Central nervous system: Alert and oriented. No focal neurological deficits. Extremities: Symmetric 5 x 5 power. Skin: No rashes, lesions or ulcers Psychiatry: Judgement and insight appear normal. Mood & affect appropriate.     Data Reviewed: I have personally reviewed following labs and imaging studies  CBC: Recent Labs  Lab 09/16/22 2120 09/17/22 0313 09/17/22 0333 09/18/22 0924 09/18/22 1200 09/19/22 0529 09/20/22 1033 09/21/22 0320  WBC 6.0 7.0  --   --   --  10.6* 7.6 9.0  NEUTROABS 3.6 6.5  --   --   --  9.1* 5.5  --   HGB 12.2 11.1*   < > 12.2 10.9* 10.2* 10.2* 10.8*  HCT 38.0 36.7   < > 36.0 32.0* 33.4* 32.0* 32.9*  MCV 89.0 91.8  --   --   --  92.3 90.7 88.9  PLT 195 172  --   --   --  166 142* 156   < > = values in this interval not displayed.    Basic Metabolic Panel: Recent Labs  Lab 09/18/22 1755 09/19/22 0529 09/19/22 1813 09/20/22 0325 09/21/22 0321 09/22/22 0433 09/23/22 0100  NA  --  134*  --  137 138 137 135  K  --  4.8  --  4.7 3.5 3.1* 3.9  CL  --  101  --  102 93* 93* 93*  CO2  --  26  --  26 31 33* 30  GLUCOSE  --  201*  --  117* 245* 102* 227*  BUN  --  43*  --  51* 51* 39* 39*  CREATININE  --  1.29*  --  1.27* 1.22* 1.02* 1.33*  CALCIUM  --  8.1*  --  8.1* 8.3* 8.2* 8.4*  MG 2.2 2.1 2.4 2.2 2.2  --   --   PHOS 3.4 5.0* 4.8* 4.5 3.6  --   --     GFR: Estimated Creatinine  Clearance: 53.2 mL/min (A) (by C-G formula based on SCr of 1.33 mg/dL (H)).  Liver Function Tests: Recent Labs  Lab 09/16/22 2120 09/17/22 0313 09/20/22 0325 09/22/22 0433  AST 19 21 66* 22  ALT 17 16 83* 67*  ALKPHOS 90 93 106 97  BILITOT 0.7 0.4 0.9 0.7  PROT 6.2* 5.9* 5.2* 5.6*  ALBUMIN 3.5 3.3* 2.9* 2.7*    CBG: Recent Labs  Lab 09/23/22 0034 09/23/22 0419 09/23/22 0728 09/23/22 1120 09/23/22 1527  GLUCAP 223* 201* 125* 301* 274*     Recent Results (from the past 240 hour(s))  SARS Coronavirus 2 by RT PCR (hospital order, performed in [facility] hospital lab) *cepheid single result test* Anterior Nasal Swab     Status: None   Collection Time: 09/17/22  1:01 AM   Specimen: Anterior Nasal Swab  Result Value Ref Range Status   SARS Coronavirus 2 by RT PCR NEGATIVE NEGATIVE Final    Comment: Performed at [facility], [address], [place], [place] [address]  MRSA Next Gen by PCR, Nasal     Status: None   Collection Time: 09/18/22  7:56 AM   Specimen: Nasal Mucosa; Nasal Swab  Result Value Ref Range Status   MRSA by PCR Next Gen NOT DETECTED NOT DETECTED Final    Comment: (NOTE) The GeneXpert MRSA Assay (FDA approved for NASAL specimens only), is one component of a comprehensive MRSA colonization surveillance program. It is not intended to diagnose MRSA infection nor to guide or monitor treatment for MRSA infections. Test performance is not FDA approved in patients less than 32 years old. Performed at [facility], [address] [place], [place] [address]   Respiratory (~20 pathogens) panel by PCR     Status: Abnormal   Collection Time: 09/19/22  7:33 AM   Specimen: Nasopharyngeal Swab; Respiratory  Result Value Ref Range Status   Adenovirus NOT DETECTED NOT DETECTED Final   Coronavirus 229E NOT DETECTED NOT DETECTED Final    Comment: (NOTE) The Coronavirus on the Respiratory Panel, DOES NOT test for the novel  Coronavirus (2019 nCoV)  Coronavirus HKU1 NOT DETECTED NOT DETECTED Final   Coronavirus NL63 DETECTED (A) NOT DETECTED Final   Coronavirus OC43 NOT DETECTED NOT DETECTED Final   Metapneumovirus NOT DETECTED NOT DETECTED Final   Rhinovirus / Enterovirus DETECTED (A) NOT DETECTED Final   Influenza A NOT DETECTED NOT DETECTED Final   Influenza B NOT DETECTED NOT DETECTED Final   Parainfluenza Virus 1 NOT DETECTED NOT DETECTED Final   Parainfluenza Virus 2 NOT DETECTED NOT DETECTED Final   Parainfluenza Virus 3 NOT DETECTED NOT DETECTED Final   Parainfluenza Virus 4 NOT DETECTED NOT DETECTED Final   Respiratory Syncytial Virus NOT DETECTED NOT DETECTED Final   Bordetella pertussis NOT DETECTED NOT DETECTED Final   Bordetella Parapertussis NOT DETECTED NOT DETECTED Final   Chlamydophila pneumoniae NOT DETECTED NOT DETECTED Final   Mycoplasma pneumoniae NOT DETECTED NOT DETECTED Final    Comment: Performed at [facility], [address], [place], [place] [address]         Radiology Studies: DG CHEST PORT 1 VIEW  Result Date: 09/23/2022 CLINICAL DATA:  Pneumothorax EXAM: PORTABLE CHEST 1 VIEW COMPARISON:  X-ray 09/23/2022 FINDINGS: Enlarged cardiopericardial silhouette. No edema. Right basilar atelectasis. No pneumothorax or significant effusion. There is a pigtail catheter again seen in the right mid thorax. Persistent right-sided chest wall gas. IMPRESSION: Pigtail catheter in place. No clear right apical pneumothorax today. Persistent chest wall gas. Enlarged heart with basilar atelectasis. Electronically Signed   By: Karen Kays M.D.   On: 09/23/2022 16:54   DG CHEST PORT 1 VIEW  Result Date: 09/23/2022 CLINICAL DATA:  Follow up pneumothorax EXAM: PORTABLE CHEST 1 VIEW COMPARISON:  09/22/2022 FINDINGS: Tiny right apical pneumothorax. Right-sided chest tube in place. Enlarged cardiac silhouette. No pleural effusion identified. IMPRESSION: Tiny right apical pneumothorax persists. Electronically Signed   By:  Layla Maw M.D.   On: 09/23/2022 08:29   DG CHEST PORT 1 VIEW  Addendum Date: 09/22/2022   ADDENDUM REPORT: 09/22/2022 12:33 ADDENDUM: Addendum to state that there is a small right apical pneumothorax with a right pleural pigtail catheter in place. Electronically Signed   By: Jacob Moores M.D.   On: 09/22/2022 12:33   Result Date: 09/22/2022 CLINICAL DATA:  Hypoxia EXAM: PORTABLE CHEST 1 VIEW COMPARISON:  Chest x-ray September 19, 2022 FINDINGS: Interval extubation and removal of the NG tube. Stable positioning of the right approach central venous catheter and right pleural pigtail catheter. The cardiomediastinal silhouette is unchanged in contour. Right basilar pulmonary opacity, likely atelectasis. No pleural effusion or pneumothorax. The visualized upper abdomen is unremarkable. No acute osseous abnormality. IMPRESSION: Right basilar pulmonary opacity, likely atelectasis. Additional differential considerations include aspiration or infection in the appropriate clinical context. Electronically Signed: By: Jacob Moores M.D. On: 09/22/2022 12:22        Scheduled Meds:  arformoterol  15 mcg Nebulization BID   aspirin  81 mg Oral Daily   budesonide (PULMICORT) nebulizer solution  0.25 mg Nebulization BID   bumetanide  2 mg Oral Daily   Chlorhexidine Gluconate Cloth  6 each Topical Daily   enoxaparin (LOVENOX) injection  40 mg Subcutaneous Q24H   famotidine  20 mg Oral QHS   folic acid  1 mg Oral Daily   insulin aspart  0-15 Units Subcutaneous TID WC   insulin aspart  0-5 Units Subcutaneous QHS   insulin glargine-yfgn  20 Units Subcutaneous BID   methimazole  5 mg Oral Daily   multivitamin with minerals  1 tablet  Oral Daily   revefenacin  175 mcg Nebulization Daily   risperiDONE  1 mg Oral Daily   And   risperiDONE  2 mg Oral QHS   rosuvastatin  20 mg Oral Daily   senna  1 tablet Oral QHS   sodium chloride flush  10 mL Intrapleural Q8H   thiamine  100 mg Oral Daily    Continuous Infusions:  sodium chloride Stopped (09/21/22 2116)     LOS: 6 days    Time spent: 40 minutes    Ramiro Harvest, MD [facility]   To contact the attending provider between 7A-7P or the covering provider during after hours 7P-7A, please log into the web site [email or web address] and access using universal [place] password for that web site. If you do not have the password, please call the hospital operator.  09/23/2022, 6:29 PM

## 2022-09-23 NOTE — Progress Notes (Signed)
   NAME:  [name] [name], MRN:  [id number], DOB:  03/26/1971, LOS: 6 ADMISSION DATE:  09/16/2022, CONSULTATION DATE: 14 2024 REFERRING MD: [facility], CHIEF COMPLAINT: Acute respiratory failure  History of Present Illness:  52 year old female with extensive past medical history is well-documented below pulmonary critical care called to bedside for acute exacerbation of COPD and acute respiratory failure.  She was transferred to intensive care unit urgently intubated.  Chest x-ray shows proper placement of NG tube and endotracheal tube.  She will be maintained in the intensive care unit until stabilized.  Pertinent  Medical History   Past Medical History:  Diagnosis Date   Anxiety disorder    Asthma    Bipolar affective disorder    Blood transfusion without reported diagnosis    CHF (congestive heart failure)    COPD (chronic obstructive pulmonary disease)    Depression    Diabetes mellitus    Migraine    Schizophrenia     Significant Hospital Events: Including procedures, antibiotic start and stop dates in addition to other pertinent events   4/10 transferred to ICU for respiratory failure 4/10 intubated, central line placed -> right sided pneumothorax and chest tube placed  Interim History / Subjective:   Extubated  Intermittently agitated and then excessively sedated following POs for pain   Titrated up to 6L HFNC   Objective   Blood pressure (!) 117/53, pulse 82, temperature 97.9 F (36.6 C), temperature source Oral, resp. rate (!) 24, height 5\' 6"  (1.676 m), weight 83.4 kg, SpO2 95 %.    FiO2 (%):  [60 %] 60 %   Intake/Output Summary (Last 24 hours) at 09/23/2022 1700 Last data filed at 09/23/2022 1522 Gross per 24 hour  Intake 940 ml  Output 1630 ml  Net -690 ml    Filed Weights   09/21/22 0500 09/22/22 0448 09/23/22 0426  Weight: 85.2 kg 84.3 kg 83.4 kg   Examination: General: chronically ill appearing Lungs: diminished bl. Right chest tube in place without air  leak on suction Cardiovascular: RRR no murmurs/gallops/rubs Abdomen: soft, non-distended Extremities: warm, trace  edema Neuro: follows commands, grossly nonfocal, attends to conversation  No recurrent pneumothorax on waterseal.   Assessment & Plan:   # Acute metabolic encephalopathy, improving # Acute hypoxic respiratory failure, multifactorial - AECOPD, volume overload # Suspected COPD exacerbation, rhinovirus infection # Acute on chronic systolic heart failure, underlying NICM, EF 20-25% # Right sided pneumothorax following central line placement # hypotension, sedation related  # Schizophrenia   P:  - continue bronchodilators - transition to bumex 2 mg bid (home diuretic) - Recheck CXR in am and pull chest tube if no pneumothorax.  - wean precedex  - decrease PO narcotics - wean O2 for saturation 92%  Best Practice (right click and "Reselect all SmartList Selections" daily)   Diet/type: OG tube DVT prophylaxis: LMWH GI prophylaxis: PPI Lines: central line - discontinue today Foley:  discontinue today Code Status:  full code  Lynnell Catalan, MD [facility] ICU Physician [facility]  Pager: [phone] Or Epic Secure Chat After hours: [phone].  09/23/2022, 5:01 PM

## 2022-09-23 NOTE — Inpatient Diabetes Management (Signed)
Inpatient Diabetes Program Recommendations  AACE/ADA: New Consensus Statement on Inpatient Glycemic Control (2015)  Target Ranges:  Prepandial:   less than 140 mg/dL      Peak postprandial:   less than 180 mg/dL (1-2 hours)      Critically ill patients:  140 - 180 mg/dL   Lab Results  Component Value Date   GLUCAP 301 (H) 09/23/2022   HGBA1C 11.0 (H) 08/27/2022    Review of Glycemic Control  Latest Reference Range & Units 09/22/22 21:28 09/23/22 00:34 09/23/22 04:19 09/23/22 07:28 09/23/22 11:20  Glucose-Capillary 70 - 99 mg/dL 937 (H) 169 (H) 678 (H) 125 (H) 301 (H)  (H): Data is abnormally high Diabetes history: DM2 Outpatient Diabetes medications: Lantus 20 units QHS Current orders for Inpatient glycemic control: Semglee 18 units BID, Novolog 0-15 units TID & HS   Inpatient Diabetes Program Recommendations:   Noted consult. Previously spoke with patient last admission on 08/31/22.  Spoke with patient again regarding outpatient diabetes. Patient feels that blood sugars have improved slightly since that admission and before getting sick with this admission.  Reviewed patient's current A1c of 11.0%. Explained what a A1c is and what it measures. Also reviewed goal A1c with patient, importance of good glucose control @ home, and blood sugar goals. Reviewed patho of DM, survival skills, interventions, current inpatient glucose trends, vascular changes and commorbidities.  Patient expresses frustrations with social issues to include homelessness. Feels that this is a huge barrier towards establishing better glycemic control. States, "if I have what I need, I will take it the way I am supposed to."  Patient answers questions appropriately. Has no further questions. Will place [facility] consult.   Thanks, Lujean Rave, MSN, RNC-OB Diabetes Coordinator [phone] (8a-5p)

## 2022-09-23 NOTE — TOC Initial Note (Signed)
Transition of Care [facility]) - Initial/Assessment Note    Patient Details  Name: [name] [name] MRN: [id number] Date of Birth: 1971-02-04  Transition of [facility]) CM/SW Contact:    Harriet Masson, RN Phone Number: 09/23/2022, 4:32 PM  Clinical Narrative:                 Spoke to patient at bedside regarding transition needs.  Patient states she hopes to stay with a friend, Roslynn Amble, at discharge. Patient requesting TOC to follow up tomorrow to verify the above plan.  Patient is agreeable to use [employer] for bipap needs.  This RNCM contacted Jermaine with [employer] to start the referral.  TOC will continue to follow for needs.     Barriers to Discharge: Continued Medical Work up   Patient Goals and CMS Choice            Expected Discharge Plan and Services       Living arrangements for the past 2 months: Homeless Shelter                                      Prior Living Arrangements/Services Living arrangements for the past 2 months: Homeless Shelter                     Activities of Daily Living Home Assistive Devices/Equipment: None ADL Screening (condition at time of admission) Patient's cognitive ability adequate to safely complete daily activities?: Yes Is the patient deaf or have difficulty hearing?: No Does the patient have difficulty seeing, even when wearing glasses/contacts?: No Does the patient have difficulty concentrating, remembering, or making decisions?: No Patient able to express need for assistance with ADLs?: Yes Does the patient have difficulty dressing or bathing?: No Independently performs ADLs?: Yes (appropriate for developmental age) Does the patient have difficulty walking or climbing stairs?: No Weakness of Legs: None Weakness of Arms/Hands: None  Permission Sought/Granted                  Emotional Assessment              Admission diagnosis:  Acute exacerbation of chronic obstructive pulmonary disease (COPD)  [J44.1] COPD exacerbation [J44.1] Acute on chronic congestive heart failure, unspecified heart failure type [I50.9] Patient Active Problem List   Diagnosis Date Noted   Acute exacerbation of chronic obstructive pulmonary disease (COPD) 09/17/2022   Prolonged QT interval 09/17/2022   Severe mitral regurgitation 09/17/2022   CKD stage 3a, GFR 45-59 ml/min 09/17/2022   Hyperglycemia due to diabetes mellitus 08/30/2022   Hyperthyroidism 08/29/2022   Hyperglycemia 08/29/2022   NSTEMI (non-ST elevated myocardial infarction) 08/27/2022   AKI (acute kidney injury) 08/21/2022   Superficial vein thrombosis 08/06/2022   Anxiety 08/05/2022   Heart failure 08/04/2022   COPD (chronic obstructive pulmonary disease) 08/04/2022   Acute kidney injury superimposed on chronic kidney disease 08/04/2022   Chronic systolic CHF (congestive heart failure) 08/03/2022   Bronchitis 08/03/2022   CHF (congestive heart failure) 08/03/2022   Acute on chronic systolic CHF (congestive heart failure) 05/15/2022   Acute pyelonephritis 07/17/2020   Homelessness 06/12/2020   Bipolar 1 disorder 12/18/2018   Suicide attempt    Overdose, intentional self-harm, initial encounter 12/13/2018   Bipolar I disorder, single manic episode, severe, with psychosis 06/09/2018   Bipolar disorder 06/09/2018   DKA, type 2 01/06/2018   Diabetic acidosis without coma  Adjustment disorder with depressed mood 01/04/2018   Oral thrush    Acute lower UTI    DM2 (diabetes mellitus, type 2) 01/03/2018   Thrush 01/03/2018   DM (diabetes mellitus), type 2, uncontrolled 06/07/2017   Abscess of groin, right 06/07/2017   Abscess 06/07/2017   Bipolar disorder with moderate depression 08/31/2015   Tobacco use disorder 08/28/2015   HTN (hypertension) 08/28/2015   Type 2 diabetes mellitus with hyperlipidemia 08/28/2015   Hyponatremia 08/28/2015   T2DM (type 2 diabetes mellitus) 10/26/2009   OBESITY 10/26/2009   Essential hypertension  10/26/2009   DISTURBANCE OF SKIN SENSATION 10/26/2009   Migraine headache 03/05/2007   PCP:  Patient, No Pcp Per Pharmacy:   [facility] [address] [place] [address] Phone: [phone] Fax: [phone]  [id number] [place], [place] - [address] AT [facility] OF [address] & [address] [place] [address] Phone: [phone] Fax: [phone]  [facility] [place], [place] - [address] [facility] [address] [place] [address] Phone: [phone] Fax: [phone]  [facility] [address] [place], [place] - [address] AT [facility] [address] [place] [address] Phone: [phone] Fax: [phone]     Social Determinants of Health (SDOH) Social History: SDOH Screenings   Food Insecurity: Food Insecurity Present (09/17/2022)  Housing: High Risk (09/17/2022)  Transportation Needs: Unmet Transportation Needs (09/17/2022)  Utilities: At Risk (09/17/2022)  Alcohol Screen: Low Risk  (12/18/2018)  Depression (PHQ2-9): Medium Risk (07/30/2020)  Financial Resource Strain: Low Risk  (05/16/2022)  Tobacco Use: High Risk (09/17/2022)   SDOH Interventions:     Readmission Risk Interventions    09/02/2022    2:41 PM 08/28/2022    3:52 PM 08/22/2022    3:19 PM  Readmission Risk Prevention Plan  Transportation Screening Complete Complete Complete  Medication Review Oceanographer) Complete Complete Complete  PCP or Specialist appointment within 3-5 days of discharge Complete Complete Complete  HRI or Home Care Consult Complete Complete Complete  SW Recovery Care/Counseling Consult   Complete  Palliative Care Screening Not Applicable Not Applicable Not Applicable  Skilled Nursing Facility Not Applicable Not Applicable Not Applicable

## 2022-09-24 ENCOUNTER — Inpatient Hospital Stay (HOSPITAL_COMMUNITY): Payer: BLUE CROSS/BLUE SHIELD

## 2022-09-24 DIAGNOSIS — N179 Acute kidney failure, unspecified: Secondary | ICD-10-CM | POA: Diagnosis not present

## 2022-09-24 DIAGNOSIS — I10 Essential (primary) hypertension: Secondary | ICD-10-CM | POA: Diagnosis not present

## 2022-09-24 DIAGNOSIS — J441 Chronic obstructive pulmonary disease with (acute) exacerbation: Secondary | ICD-10-CM | POA: Diagnosis not present

## 2022-09-24 DIAGNOSIS — I5023 Acute on chronic systolic (congestive) heart failure: Secondary | ICD-10-CM | POA: Diagnosis not present

## 2022-09-24 LAB — BASIC METABOLIC PANEL
Anion gap: 9 (ref 5–15)
BUN: 28 mg/dL — ABNORMAL HIGH (ref 6–20)
CO2: 33 mmol/L — ABNORMAL HIGH (ref 22–32)
Calcium: 8.6 mg/dL — ABNORMAL LOW (ref 8.9–10.3)
Chloride: 93 mmol/L — ABNORMAL LOW (ref 98–111)
Creatinine, Ser: 0.88 mg/dL (ref 0.44–1.00)
GFR, Estimated: >60 mL/min (ref >60)
Glucose, Bld: 256 mg/dL — ABNORMAL HIGH (ref 70–99)
Potassium: 3.7 mmol/L (ref 3.5–5.1)
Sodium: 135 mmol/L (ref 135–145)

## 2022-09-24 LAB — CBC WITH DIFFERENTIAL/PLATELET
Abs Immature Granulocytes: 0.04 10*3/uL (ref 0.00–0.07)
Basophils Absolute: 0 10*3/uL (ref 0.0–0.1)
Basophils Relative: 0 %
Eosinophils Absolute: 0.3 10*3/uL (ref 0.0–0.5)
Eosinophils Relative: 4 %
HCT: 33.8 % — ABNORMAL LOW (ref 36.0–46.0)
Hemoglobin: 11 g/dL — ABNORMAL LOW (ref 12.0–15.0)
Immature Granulocytes: 1 %
Lymphocytes Relative: 19 %
Lymphs Abs: 1.4 10*3/uL (ref 0.7–4.0)
MCH: 28.7 pg (ref 26.0–34.0)
MCHC: 32.5 g/dL (ref 30.0–36.0)
MCV: 88.3 fL (ref 80.0–100.0)
Monocytes Absolute: 0.8 10*3/uL (ref 0.1–1.0)
Monocytes Relative: 10 %
Neutro Abs: 5 10*3/uL (ref 1.7–7.7)
Neutrophils Relative %: 66 %
Platelets: 147 10*3/uL — ABNORMAL LOW (ref 150–400)
RBC: 3.83 MIL/uL — ABNORMAL LOW (ref 3.87–5.11)
RDW: 14.3 % (ref 11.5–15.5)
WBC: 7.5 10*3/uL (ref 4.0–10.5)
nRBC: 0 % (ref 0.0–0.2)

## 2022-09-24 LAB — GLUCOSE, CAPILLARY
Glucose-Capillary: 233 mg/dL — ABNORMAL HIGH (ref 70–99)
Glucose-Capillary: 257 mg/dL — ABNORMAL HIGH (ref 70–99)
Glucose-Capillary: 231 mg/dL — ABNORMAL HIGH (ref 70–99)
Glucose-Capillary: 212 mg/dL — ABNORMAL HIGH (ref 70–99)
Glucose-Capillary: 167 mg/dL — ABNORMAL HIGH (ref 70–99)

## 2022-09-24 LAB — MAGNESIUM: Magnesium: 2.2 mg/dL (ref 1.7–2.4)

## 2022-09-24 MED ORDER — INSULIN GLARGINE-YFGN 100 UNIT/ML ~~LOC~~ SOLN
24.0000 [IU] | Freq: Two times a day (BID) | SUBCUTANEOUS | Status: DC
  Administered 2022-09-24 – 2022-09-30 (×11): 24 [IU] via SUBCUTANEOUS
  Filled 2022-09-24 (×14): qty 0.24

## 2022-09-24 MED ORDER — INSULIN ASPART 100 UNIT/ML IJ SOLN
3.0000 [IU] | Freq: Three times a day (TID) | INTRAMUSCULAR | Status: DC
  Administered 2022-09-24 – 2022-09-30 (×18): 3 [IU] via SUBCUTANEOUS

## 2022-09-24 MED ORDER — PREDNISONE 20 MG PO TABS
40.0000 mg | ORAL_TABLET | Freq: Every day | ORAL | Status: AC
  Administered 2022-09-25 – 2022-09-27 (×3): 40 mg via ORAL
  Filled 2022-09-24 (×3): qty 2

## 2022-09-24 NOTE — Inpatient Diabetes Management (Signed)
Inpatient Diabetes Program Recommendations  AACE/ADA: New Consensus Statement on Inpatient Glycemic Control (2015)  Target Ranges:  Prepandial:   less than 140 mg/dL      Peak postprandial:   less than 180 mg/dL (1-2 hours)      Critically ill patients:  140 - 180 mg/dL   Lab Results  Component Value Date   GLUCAP 257 (H) 09/24/2022   HGBA1C 11.0 (H) 08/27/2022    Review of Glycemic Control  Latest Reference Range & Units 09/23/22 11:20 09/23/22 15:27 09/23/22 23:39 09/24/22 03:52 09/24/22 08:01  Glucose-Capillary 70 - 99 mg/dL 974 (H) 163 (H) 845 (H) 233 (H) 257 (H)  (H): Data is abnormally high Diabetes history: DM2 Outpatient Diabetes medications: Lantus 20 units QHS Current orders for Inpatient glycemic control: Semglee 20 units BID, Novolog 0-15 units TID & HS   Inpatient Diabetes Program Recommendations:    Consider: -increasing Semglee to 24 units BID -Adding Novolog 3 units TID (Assuming patient is consuming >50% of meals)   Thanks, Lujean Rave, MSN, RNC-OB Diabetes Coordinator [phone] (8a-5p)

## 2022-09-24 NOTE — Progress Notes (Signed)
Nutrition Follow-up  DOCUMENTATION CODES:  Not applicable  INTERVENTION:  Continue current diet as ordered Add HS snack MVI with minerals daily  NUTRITION DIAGNOSIS:  Inadequate oral intake related to inability to eat as evidenced by NPO status.  GOAL:  Patient will meet greater than or equal to 90% of their needs  MONITOR:  PO intake, Skin, Labs, Weight trends  REASON FOR ASSESSMENT:  Consult Enteral/tube feeding initiation and management  ASSESSMENT:   Pt with hx of COPD, CHF, DM type 2, CKD3, and schizophrenia presented to ED with SOB. Pt currently homeless and with tobacco use  4/10 - intubated, chest tube placed 4/13 - extubated 4/15 - chest tube removed  Pt resting in bedside char at the time of assessment. Reports that she is feeling ok today. Having some pain.   Reports she did well with her breakfast, is waiting on her lunch to arrive. Pt does report that she is lactose intolerant, does not want to receive milk on her trays. Added intolerance to allergy list.   Pt does report that she got hungry last night and had to ask for a snack. States that she typically does have a snack before bed. Will request one be provided from dining services. Pt appreciative.    Intake/Output Summary (Last 24 hours) at 09/24/2022 1352 Last data filed at 09/24/2022 0845 Gross per 24 hour  Intake 360 ml  Output 1120 ml  Net -760 ml  Net IO Since Admission: -2,643.25 mL [09/24/22 1352]  Nutritionally Relevant Medications: Scheduled Meds:  famotidine  20 mg Oral QHS   folic acid  1 mg Oral Daily   insulin aspart  0-15 Units Subcutaneous TID WC   insulin aspart  0-5 Units Subcutaneous QHS   insulin glargine-yfgn  20 Units Subcutaneous BID   multivitamin with minerals  1 tablet Oral Daily   rosuvastatin  20 mg Oral Daily   senna  1 tablet Oral QHS   thiamine  100 mg Oral Daily   PRN Meds: bisacodyl, ondansetron  Labs Reviewed: Chloride 93 BUN 28 CBG ranges from 125-301  mg/dL over the last 24 hours  NUTRITION - FOCUSED PHYSICAL EXAM: Flowsheet Row Most Recent Value  Orbital Region Mild depletion  Upper Arm Region No depletion  Thoracic and Lumbar Region No depletion  Buccal Region No depletion  Temple Region No depletion  Clavicle Bone Region No depletion  Clavicle and Acromion Bone Region No depletion  Scapular Bone Region No depletion  Dorsal Hand No depletion  Patellar Region No depletion  Anterior Thigh Region No depletion  Posterior Calf Region No depletion  Edema (RD Assessment) None  Hair Reviewed  Eyes Reviewed  Mouth Reviewed  Skin Reviewed  Nails Reviewed  [painted dark blue]    Diet Order:   Diet Order             Diet Carb Modified Fluid consistency: Thin; Room service appropriate? Yes with Assist  Diet effective now                   EDUCATION NEEDS:  Education needs have been addressed  Skin:  Skin Assessment: Reviewed RN Assessment stage 1: right buttocks (7 cm x 10 cm)  Last BM:  4/15 - type 6  Height:  Ht Readings from Last 1 Encounters:  09/16/22  (1.676 m)    Weight:  Wt Readings from Last 1 Encounters:  09/24/22 81.3 kg    Ideal Body Weight:  59.1 kg  BMI:  Body mass index is 28.93 kg/m.  Estimated Nutritional Needs:  Kcal:  1700-1900 kcal/d Protein:  90-105g/d Fluid:  1.8-2L/d   Greig Castilla, RD, LDN Clinical Dietitian RD pager # available in [facility]  After hours/weekend pager # available in [facility]

## 2022-09-24 NOTE — Progress Notes (Signed)
PROGRESS NOTE    [name] [name]  ZOX:[id number] DOB: Dec 24, 1970 DOA: 09/16/2022 PCP: Patient, No Pcp Per    Chief Complaint  Patient presents with   Shortness of Breath    Brief Narrative:  Patient 52 year old female history of COPD, CHF, type 2 diabetes, CKD, anxiety, hypothyroidism presented with shortness of breath progressive with wheezing.  No PND no orthopnea no lower extremity edema.  Patient initially admitted placed on IV steroids, bronchodilator therapy and being treated for acute COPD exacerbation.  Also on presentation concern for acute on chronic systolic heart failure and patient placed on IV Lasix.  Patient also noted on presentation to be in acute kidney injury on CKD. -Patient's respiratory status worsened and noted to going acute respiratory failure and as such PCCM consulted.  Patient was urgently transferred to the ICU and urgently intubated.  Chest x-ray showed proper placement of NG tube and endotracheal tube.  Patient also had a central line placed that led to a right-sided pneumothorax and chest tube placed by PCCM. -Patient subsequently extubated and transferred to the hospitalist team 09/23/2022 with PCCM following for chest tube management.   Assessment & Plan:   Principal Problem:   Acute exacerbation of chronic obstructive pulmonary disease (COPD) Active Problems:   Acute on chronic systolic CHF (congestive heart failure)   Acute kidney injury superimposed on chronic kidney disease   Essential hypertension   Type 2 diabetes mellitus with hyperlipidemia   Hyperthyroidism   Bipolar 1 disorder   COPD exacerbation   Pressure injury of skin  #1 acute hypoxemic respiratory failure multifactorial secondary to acute COPD exacerbation acute on chronic systolic heart failure in the setting of rhinovirus infection and coronavirus NL 63  -Patient initially was being treated for acute COPD exacerbation and acute CHF exacerbation with IV steroids, nebulizer treatments,  IV Lasix. -Patient's respiratory status worsened and was transferred to the ICU emergently intubated. -Patient subsequently extubated successfully, currently on 6 L high flow nasal cannula. -Urine output of 1.250 L over the past 24 hours. -Patient is -2.6 L during this hospitalization. -Patient slowly improving in the setting of rhinovirus infection.  -Continue Brovana, Pulmicort, Bumex,yupelri. -Patient started on a short course of prednisone per pulmonary. -Supportive care. -PCCM following and appreciate input and recommendations.  2.  Right-sided pneumothorax following central line placement -Chest tube removed by PCCM.   -Follow clinically and if shortness of breath worsens or patient deteriorates respiratory wise will need a repeat checks x-ray.   3.  History of schizophrenia/bipolar disorder -Currently stable. -Continue risperdal.  4.  Hyperlipidemia -Statin.  5.  Hyperthyroidism -Toprol-XL currently on hold. -Continue Tapazole.  6.  Diabetes mellitus type 2 -Hemoglobin A1c 11.0 (08/27/2022) -CBG 257 this morning. -Per med rec patient on 24 units of Toujeo at bedtime. -Increase Semglee to 24 units twice daily.  Start NovoLog meal coverage 3 units 3 times daily with meals.  SSI.    7.  AKI on CKD -Improved since admission. -Follow.  8.  Hypertension -BP is soft.   -Continue to hold antihypertensive meds.    9.  Pressure injury, POA Pressure Injury 09/17/22 Buttocks Left;Right Stage 1 -  Intact skin with non-blanchable redness of a localized area usually over a bony prominence. On both checks (Active)  09/17/22 2000  Location: Buttocks  Location Orientation: Left;Right  Staging: Stage 1 -  Intact skin with non-blanchable redness of a localized area usually over a bony prominence.  Wound Description (Comments): On both checks  Present on Admission:  Yes         DVT prophylaxis: Lovenox Code Status: Full Family Communication: Updated patient.  No family at  bedside. Disposition: Homelessness.  TBD.  Status is: Inpatient Remains inpatient appropriate because: Severity of illness   Consultants:  PCCM: Dr. Katrinka Blazing 09/18/2022  Procedures:    Significant Hospital Events: Including procedures, antibiotic start and stop dates in addition to other pertinent events   4/10 transferred to ICU for respiratory failure 4/10 intubated, central line placed -> right sided pneumothorax and chest tube placed  Antimicrobials:  Anti-infectives (From admission, onward)    Start     Dose/Rate Route Frequency Ordered Stop   09/19/22 1115  doxycycline (VIBRAMYCIN) 100 mg in sodium chloride 0.9 % 250 mL IVPB        100 mg 125 mL/hr over 120 Minutes Intravenous Every 12 hours 09/19/22 1019 09/22/22 0026   09/18/22 1015  cefTRIAXone (ROCEPHIN) 2 g in sodium chloride 0.9 % 100 mL IVPB  Status:  Discontinued        2 g 200 mL/hr over 30 Minutes Intravenous Every 24 hours 09/18/22 0926 09/19/22 1017   09/18/22 1015  doxycycline (VIBRAMYCIN) 100 mg in sodium chloride 0.9 % 250 mL IVPB  Status:  Discontinued        100 mg 125 mL/hr over 120 Minutes Intravenous Every 12 hours 09/18/22 0926 09/19/22 1013   09/17/22 0030  doxycycline (VIBRAMYCIN) 100 mg in sodium chloride 0.9 % 250 mL IVPB  Status:  Discontinued       Note to Pharmacy: (In setting of acute copd exac)   100 mg 125 mL/hr over 120 Minutes Intravenous 2 times daily 09/17/22 0018 09/17/22 1527         Subjective: Laying in bed.  On 6 L high flow nasal cannula.  Chest tube removed this morning per PCCM.  Denies any chest pain.  No significant shortness of breath.    Objective: Vitals:   09/24/22 0700 09/24/22 0736 09/24/22 0800 09/24/22 0807  BP: (!) 107/58  104/80   Pulse: 84  91   Resp: 20  (!) 22   Temp:    98.4 F (36.9 C)  TempSrc:    Oral  SpO2: 96% 97% 92%   Weight:      Height:        Intake/Output Summary (Last 24 hours) at 09/24/2022 1125 Last data filed at 09/24/2022 0845 Gross  per 24 hour  Intake 720 ml  Output 1320 ml  Net -600 ml    Filed Weights   09/23/22 0426 09/23/22 2200 09/24/22 0500  Weight: 83.4 kg 81.3 kg 81.3 kg    Examination:  General exam: NAD. Respiratory system: Shallow breath sounds.  No significant wheezing.  No significant crackles.  Decreased breath sounds in the bases.   Cardiovascular system: Regular rate and rhythm no murmurs rubs or gallops.  No JVD.  No lower extremity edema.  Gastrointestinal system: Abdomen soft, nontender, nondistended.  Positive bowel sounds.  No rebound.  No guarding. Central nervous system: Alert and oriented. No focal neurological deficits. Extremities: Symmetric 5 x 5 power. Skin: No rashes, lesions or ulcers Psychiatry: Judgement and insight appear normal. Mood & affect appropriate.     Data Reviewed: I have personally reviewed following labs and imaging studies  CBC: Recent Labs  Lab 09/18/22 1200 09/19/22 0529 09/20/22 1033 09/21/22 0320 09/24/22 0239  WBC  --  10.6* 7.6 9.0 7.5  NEUTROABS  --  9.1* 5.5  --  5.0  HGB 10.9* 10.2* 10.2* 10.8* 11.0*  HCT 32.0* 33.4* 32.0* 32.9* 33.8*  MCV  --  92.3 90.7 88.9 88.3  PLT  --  166 142* 156 147*     Basic Metabolic Panel: Recent Labs  Lab 09/18/22 1755 09/19/22 0529 09/19/22 1813 09/20/22 0325 09/21/22 0321 09/22/22 0433 09/23/22 0100 09/24/22 0239  NA  --  134*  --  137 138 137 135 135  K  --  4.8  --  4.7 3.5 3.1* 3.9 3.7  CL  --  101  --  102 93* 93* 93* 93*  CO2  --  26  --  26 31 33* 30 33*  GLUCOSE  --  201*  --  117* 245* 102* 227* 256*  BUN  --  43*  --  51* 51* 39* 39* 28*  CREATININE  --  1.29*  --  1.27* 1.22* 1.02* 1.33* 0.88  CALCIUM  --  8.1*  --  8.1* 8.3* 8.2* 8.4* 8.6*  MG 2.2 2.1 2.4 2.2 2.2  --   --  2.2  PHOS 3.4 5.0* 4.8* 4.5 3.6  --   --   --      GFR: Estimated Creatinine Clearance: 79.5 mL/min (by C-G formula based on SCr of 0.88 mg/dL).  Liver Function Tests: Recent Labs  Lab 09/20/22 0325  09/22/22 0433  AST 66* 22  ALT 83* 67*  ALKPHOS 106 97  BILITOT 0.9 0.7  PROT 5.2* 5.6*  ALBUMIN 2.9* 2.7*     CBG: Recent Labs  Lab 09/23/22 1120 09/23/22 1527 09/23/22 2339 09/24/22 0352 09/24/22 0801  GLUCAP 301* 274* 242* 233* 257*      Recent Results (from the past 240 hour(s))  SARS Coronavirus 2 by RT PCR (hospital order, performed in [facility] hospital lab) *cepheid single result test* Anterior Nasal Swab     Status: None   Collection Time: 09/17/22  1:01 AM   Specimen: Anterior Nasal Swab  Result Value Ref Range Status   SARS Coronavirus 2 by RT PCR NEGATIVE NEGATIVE Final    Comment: Performed at [facility], [address] [place], [place] [address]  MRSA Next Gen by PCR, Nasal     Status: None   Collection Time: 09/18/22  7:56 AM   Specimen: Nasal Mucosa; Nasal Swab  Result Value Ref Range Status   MRSA by PCR Next Gen NOT DETECTED NOT DETECTED Final    Comment: (NOTE) The GeneXpert MRSA Assay (FDA approved for NASAL specimens only), is one component of a comprehensive MRSA colonization surveillance program. It is not intended to diagnose MRSA infection nor to guide or monitor treatment for MRSA infections. Test performance is not FDA approved in patients less than 40 years old. Performed at [facility], [address] [place], [place] [address]   Respiratory (~20 pathogens) panel by PCR     Status: Abnormal   Collection Time: 09/19/22  7:33 AM   Specimen: Nasopharyngeal Swab; Respiratory  Result Value Ref Range Status   Adenovirus NOT DETECTED NOT DETECTED Final   Coronavirus 229E NOT DETECTED NOT DETECTED Final    Comment: (NOTE) The Coronavirus on the Respiratory Panel, DOES NOT test for the novel  Coronavirus (2019 nCoV)    Coronavirus HKU1 NOT DETECTED NOT DETECTED Final   Coronavirus NL63 DETECTED (A) NOT DETECTED Final   Coronavirus OC43 NOT DETECTED NOT DETECTED Final   Metapneumovirus NOT DETECTED NOT DETECTED Final    Rhinovirus / Enterovirus DETECTED (A)  NOT DETECTED Final   Influenza A NOT DETECTED NOT DETECTED Final   Influenza B NOT DETECTED NOT DETECTED Final   Parainfluenza Virus 1 NOT DETECTED NOT DETECTED Final   Parainfluenza Virus 2 NOT DETECTED NOT DETECTED Final   Parainfluenza Virus 3 NOT DETECTED NOT DETECTED Final   Parainfluenza Virus 4 NOT DETECTED NOT DETECTED Final   Respiratory Syncytial Virus NOT DETECTED NOT DETECTED Final   Bordetella pertussis NOT DETECTED NOT DETECTED Final   Bordetella Parapertussis NOT DETECTED NOT DETECTED Final   Chlamydophila pneumoniae NOT DETECTED NOT DETECTED Final   Mycoplasma pneumoniae NOT DETECTED NOT DETECTED Final    Comment: Performed at [facility], [address] [place], [place] [address]         Radiology Studies: DG CHEST PORT 1 VIEW  Result Date: 09/24/2022 CLINICAL DATA:  Pneumothorax. EXAM: PORTABLE CHEST 1 VIEW COMPARISON:  09/23/2022. FINDINGS: 0457 hours. Unchanged right-sided pleural drainage catheter. No definite residual pneumothorax. Slightly decreased subcutaneous emphysema along the right chest wall. Improved aeration of the right lung base. No consolidation. Stable mild cardiomegaly and mediastinal contours. IMPRESSION: Unchanged right-sided pleural drainage catheter with no definite residual pneumothorax. Electronically Signed   By: Orvan Falconer M.D.   On: 09/24/2022 08:16   DG CHEST PORT 1 VIEW  Result Date: 09/23/2022 CLINICAL DATA:  Pneumothorax EXAM: PORTABLE CHEST 1 VIEW COMPARISON:  X-ray 09/23/2022 FINDINGS: Enlarged cardiopericardial silhouette. No edema. Right basilar atelectasis. No pneumothorax or significant effusion. There is a pigtail catheter again seen in the right mid thorax. Persistent right-sided chest wall gas. IMPRESSION: Pigtail catheter in place. No clear right apical pneumothorax today. Persistent chest wall gas. Enlarged heart with basilar atelectasis. Electronically Signed   By: Karen Kays  M.D.   On: 09/23/2022 16:54   DG CHEST PORT 1 VIEW  Result Date: 09/23/2022 CLINICAL DATA:  Follow up pneumothorax EXAM: PORTABLE CHEST 1 VIEW COMPARISON:  09/22/2022 FINDINGS: Tiny right apical pneumothorax. Right-sided chest tube in place. Enlarged cardiac silhouette. No pleural effusion identified. IMPRESSION: Tiny right apical pneumothorax persists. Electronically Signed   By: Layla Maw M.D.   On: 09/23/2022 08:29   DG CHEST PORT 1 VIEW  Addendum Date: 09/22/2022   ADDENDUM REPORT: 09/22/2022 12:33 ADDENDUM: Addendum to state that there is a small right apical pneumothorax with a right pleural pigtail catheter in place. Electronically Signed   By: Jacob Moores M.D.   On: 09/22/2022 12:33   Result Date: 09/22/2022 CLINICAL DATA:  Hypoxia EXAM: PORTABLE CHEST 1 VIEW COMPARISON:  Chest x-ray September 19, 2022 FINDINGS: Interval extubation and removal of the NG tube. Stable positioning of the right approach central venous catheter and right pleural pigtail catheter. The cardiomediastinal silhouette is unchanged in contour. Right basilar pulmonary opacity, likely atelectasis. No pleural effusion or pneumothorax. The visualized upper abdomen is unremarkable. No acute osseous abnormality. IMPRESSION: Right basilar pulmonary opacity, likely atelectasis. Additional differential considerations include aspiration or infection in the appropriate clinical context. Electronically Signed: By: Jacob Moores M.D. On: 09/22/2022 12:22        Scheduled Meds:  arformoterol  15 mcg Nebulization BID   aspirin  81 mg Oral Daily   budesonide (PULMICORT) nebulizer solution  0.25 mg Nebulization BID   bumetanide  2 mg Oral Daily   Chlorhexidine Gluconate Cloth  6 each Topical Daily   enoxaparin (LOVENOX) injection  40 mg Subcutaneous Q24H   famotidine  20 mg Oral QHS   folic acid  1 mg Oral Daily  insulin aspart  0-15 Units Subcutaneous TID WC   insulin aspart  0-5 Units Subcutaneous QHS   insulin  glargine-yfgn  20 Units Subcutaneous BID   methimazole  5 mg Oral Daily   multivitamin with minerals  1 tablet Oral Daily   [START ON 09/25/2022] predniSONE  40 mg Oral Q breakfast   revefenacin  175 mcg Nebulization Daily   risperiDONE  1 mg Oral Daily   And   risperiDONE  2 mg Oral QHS   rosuvastatin  20 mg Oral Daily   senna  1 tablet Oral QHS   sodium chloride flush  10 mL Intrapleural Q8H   thiamine  100 mg Oral Daily   Continuous Infusions:  sodium chloride Stopped (09/21/22 2116)     LOS: 7 days    Time spent: 40 minutes    Ramiro Harvest, MD [facility]   To contact the attending provider between 7A-7P or the covering provider during after hours 7P-7A, please log into the web site [email or web address] and access using universal [place] password for that web site. If you do not have the password, please call the hospital operator.  09/24/2022, 11:25 AM

## 2022-09-24 NOTE — Evaluation (Signed)
Physical Therapy Evaluation Patient Details Name: [name] [name] MRN: [id number] DOB: 06-23-1970 Today's Date: 09/24/2022  History of Present Illness  Pt is a 52 y/o female admitted with acute exacerbation of COPD. Noted with R pneumothorax with chest tube placed; removed 4/16. Rhinovirus+. Intubated 4/10-4/13. PMH includes: nonischemic cardiomyopathy, severe MR, schizoaffective disorder, COPD/asthma, cigarette smoker, IIDM.  Clinical Impression   Pt presents with generalized weakness s/p extubation, impaired balance, dizziness with mobility (BP stable, see below), and decreased activity tolerance. Pt to benefit from acute PT to address deficits. Pt ambulated short room distance with light steadying assist, pt reaching for environment to self-steady as well. SpO2 90% and greater on 4LO2 during mobility. PT anticipates good functional recovery while acute, per pt she is working on finding somewhere to stay. PT to progress mobility as tolerated, and will continue to follow acutely.    BP, HR: -supine:106/74, 94 -sitting:118/69, 94 -standing: 128/71, 103     Recommendations for follow up therapy are one component of a multi-disciplinary discharge planning process, led by the attending physician.  Recommendations may be updated based on patient status, additional functional criteria and insurance authorization.  Follow Up Recommendations       Assistance Recommended at Discharge PRN  Patient can return home with the following  A little help with walking and/or transfers;A little help with bathing/dressing/bathroom    Equipment Recommendations None recommended by PT  Recommendations for Other Services       Functional Status Assessment Patient has had a recent decline in their functional status and demonstrates the ability to make significant improvements in function in a reasonable and predictable amount of time.     Precautions / Restrictions Precautions Precautions: Fall Precaution  Comments: monitor O2 Restrictions Weight Bearing Restrictions: No      Mobility  Bed Mobility Overal bed mobility: Needs Assistance Bed Mobility: Supine to Sit, Sit to Supine     Supine to sit: Min assist, HOB elevated Sit to supine: Min assist, HOB elevated   General bed mobility comments: assist for trunk elevation, LE lift back into bed upon return to supine.    Transfers Overall transfer level: Needs assistance Equipment used: 1 person hand held assist Transfers: Sit to/from Stand Sit to Stand: Min guard           General transfer comment: close guard for safety    Ambulation/Gait Ambulation/Gait assistance: Min assist Gait Distance (Feet): 20 Feet Assistive device: 1 person hand held assist Gait Pattern/deviations: Step-through pattern, Decreased stride length, Trunk flexed Gait velocity: decr     General Gait Details: assist to steady, pt stating she felt dizzy and drunk (BP stable)  Stairs            Wheelchair Mobility    Modified Rankin (Stroke Patients Only)       Balance Overall balance assessment: Needs assistance Sitting-balance support: No upper extremity supported, Feet supported Sitting balance-Leahy Scale: Good     Standing balance support: No upper extremity supported, During functional activity Standing balance-Leahy Scale: Fair                               Pertinent Vitals/Pain Pain Assessment Pain Assessment: Faces Faces Pain Scale: Hurts little more Pain Location: generalized Pain Descriptors / Indicators: Sore, Discomfort Pain Intervention(s): Limited activity within patient's tolerance, Monitored during session, Repositioned    Home Living Family/patient expects to be discharged to:: Unsure  Additional Comments: has been staying at homeless shelter; reports sleeping on the floor and no access to the O2 that she reportedly was supposed to wear. Reports she is hopeful to stay with  a friend or her daughter at [place]    Prior Function Prior Level of Function : Independent/Modified Independent             Mobility Comments: no AD ADLs Comments: Independent     Hand Dominance   Dominant Hand: Right    Extremity/Trunk Assessment   Upper Extremity Assessment Upper Extremity Assessment: Defer to OT evaluation    Lower Extremity Assessment Lower Extremity Assessment: Generalized weakness    Cervical / Trunk Assessment Cervical / Trunk Assessment: Kyphotic  Communication   Communication: No difficulties  Cognition Arousal/Alertness: Awake/alert Behavior During Therapy: WFL for tasks assessed/performed Overall Cognitive Status: Within Functional Limits for tasks assessed                                          General Comments General comments (skin integrity, edema, etc.): SPO2 90% and greater on 4LO2 during mobility, placed back on 6LO2 at end of session and RN notified    Exercises     Assessment/Plan    PT Assessment Patient needs continued PT services  PT Problem List Decreased activity tolerance;Decreased balance;Decreased mobility;Cardiopulmonary status limiting activity;Pain;Decreased strength;Decreased safety awareness       PT Treatment Interventions Gait training;Therapeutic activities;Balance training;Neuromuscular re-education;Patient/family education;Stair training;DME instruction;Therapeutic exercise;Functional mobility training    PT Goals (Current goals can be found in the Care Plan section)  Acute Rehab PT Goals PT Goal Formulation: With patient Time For Goal Achievement: 10/08/22 Potential to Achieve Goals: Good    Frequency Min 3X/week     Co-evaluation               AM-PAC PT "6 Clicks" Mobility  Outcome Measure Help needed turning from your back to your side while in a flat bed without using bedrails?: A Little Help needed moving from lying on your back to sitting on the side of a flat bed  without using bedrails?: A Little Help needed moving to and from a bed to a chair (including a wheelchair)?: A Little Help needed standing up from a chair using your arms (e.g., wheelchair or bedside chair)?: A Little Help needed to walk in hospital room?: A Little Help needed climbing 3-5 steps with a railing? : A Lot 6 Click Score: 17    End of Session Equipment Utilized During Treatment: Oxygen Activity Tolerance: Patient tolerated treatment well Patient left: in bed;with call bell/phone within reach;with nursing/sitter in room;with bed alarm set Nurse Communication: Mobility status PT Visit Diagnosis: Other abnormalities of gait and mobility (R26.89);Muscle weakness (generalized) (M62.81)    Time: 2080-2233 PT Time Calculation (min) (ACUTE ONLY): 12 min   Charges:   PT Evaluation $PT Eval Low Complexity: 1 Low          Jaren Vanetten S, PT DPT Acute Rehabilitation Services Pager [phone]  Office [phone]   Koltan Portocarrero E Christain [place] 09/24/2022, 4:20 PM

## 2022-09-24 NOTE — Progress Notes (Signed)
[name] [name] has Acute on Chronic hypoxic/hypercapnic respiratory failure secondary to COPD. Pt requires frequent durations of respiratory support and deteriorates quickly in the absence of non-invasive mechanical ventilator. BIPAP,BIPAP ST, AVAPS has been considered but has been ruled-out and insufficient. NIV therapy is needed Pt's PC02 was >65.3 during this hospital stay on 09/18/2022. Interruption or failure to provide NIV would quickly lead to exacerbation of the patient's condition, lead to hospitalization and likely harm the patient or possibly death. Continued use of the NIV is preferred. Patient is able to maintain airway and clear secretions.

## 2022-09-24 NOTE — Evaluation (Signed)
Occupational Therapy Evaluation Patient Details Name: [name] [name] MRN: [id number] DOB: 03-23-1971 Today's Date: 09/24/2022   History of Present Illness Pt is a 52 y/o female admitted with acute exacerbation of COPD. Noted with R pneumothorax with chest tube placed. Rhinovirus+. Intubated 4/10-4/13. PMH includes: nonischemic cardiomyopathy, severe MR, schizoaffective disorder, COPD/asthma, cigarette smoker, IIDM.   Clinical Impression   PTA, pt from homeless shelter, reports typically Independent with daily tasks. Pt presents now with deficits in dynamic standing balance and cardiopulmonary endurance, requiring 6 L O2 to maintain sats with ADLs. Pt able to manage ADLs with no more than Supervision though reports feeling more stable when using RW for mobility. Anticipate with continued OOB activity that mobility and O2 titration will improve. Will continue to follow acutely though anticipate no OT needs at DC. Noted that if pt to DC w/ supplemental O2, she reports unable to use in shelter.     Recommendations for follow up therapy are one component of a multi-disciplinary discharge planning process, led by the attending physician.  Recommendations may be updated based on patient status, additional functional criteria and insurance authorization.   Assistance Recommended at Discharge PRN  Patient can return home with the following      Functional Status Assessment  Patient has had a recent decline in their functional status and demonstrates the ability to make significant improvements in function in a reasonable and predictable amount of time.  Equipment Recommendations  None recommended by OT    Recommendations for Other Services       Precautions / Restrictions Precautions Precautions: Fall Precaution Comments: monitor O2, chest tube Restrictions Weight Bearing Restrictions: No      Mobility Bed Mobility Overal bed mobility: Modified Independent                   Transfers Overall transfer level: Modified independent Equipment used: Rolling walker (2 wheels)                      Balance Overall balance assessment: Needs assistance Sitting-balance support: No upper extremity supported, Feet supported Sitting balance-Leahy Scale: Good     Standing balance support: No upper extremity supported, Bilateral upper extremity supported, During functional activity Standing balance-Leahy Scale: Fair                             ADL either performed or assessed with clinical judgement   ADL Overall ADL's : Needs assistance/impaired Eating/Feeding: Independent   Grooming: Supervision/safety;Standing;Oral care Grooming Details (indicate cue type and reason): standing at sink, utilized RW to walk to/from sink. No LOB Upper Body Bathing: Modified independent   Lower Body Bathing: Supervison/ safety   Upper Body Dressing : Modified independent   Lower Body Dressing: Supervision/safety   Toilet Transfer: Supervision/safety;Ambulation;Rolling walker (2 wheels)   Toileting- Clothing Manipulation and Hygiene: Supervision/safety;Sit to/from stand;Sitting/lateral lean       Functional mobility during ADLs: Supervision/safety;Rolling walker (2 wheels) General ADL Comments: Fairly close to baseline though reprots some new unsteadiness/dizziness w/ mobility and feels better with RW use currently. Anticipate improvements in respiratory status and progression to no AD prior to DC.     Vision Baseline Vision/History: 0 No visual deficits Ability to See in Adequate Light: 0 Adequate Patient Visual Report: No change from baseline Vision Assessment?: No apparent visual deficits     Perception     Praxis      Pertinent Vitals/Pain Pain Assessment  Pain Assessment: 0-10 Pain Score: 5  Pain Location: L hip, L knee and chest at tube site Pain Descriptors / Indicators: Sore Pain Intervention(s): Monitored during session     Hand  Dominance Right   Extremity/Trunk Assessment Upper Extremity Assessment Upper Extremity Assessment: Overall WFL for tasks assessed   Lower Extremity Assessment Lower Extremity Assessment: Defer to PT evaluation   Cervical / Trunk Assessment Cervical / Trunk Assessment: Kyphotic   Communication Communication Communication: No difficulties   Cognition Arousal/Alertness: Awake/alert Behavior During Therapy: WFL for tasks assessed/performed Overall Cognitive Status: Within Functional Limits for tasks assessed                                       General Comments       Exercises     Shoulder Instructions      Home Living Family/patient expects to be discharged to:: Unsure                                 Additional Comments: has been staying at homeless shelter; reports sleeping on the floor and no access to the O2 that she reportedly was supposed to wear. Reports she is hopeful to stay with a friend or her daughter at DC      Prior Functioning/Environment Prior Level of Function : Independent/Modified Independent             Mobility Comments: no AD ADLs Comments: Independent        OT Problem List: Decreased strength;Decreased activity tolerance;Impaired balance (sitting and/or standing);Cardiopulmonary status limiting activity      OT Treatment/Interventions: Self-care/ADL training;Therapeutic exercise;Energy conservation;DME and/or AE instruction;Therapeutic activities    OT Goals(Current goals can be found in the care plan section) Acute Rehab OT Goals Patient Stated Goal: have somewhere to stay OT Goal Formulation: With patient Time For Goal Achievement: 10/08/22 Potential to Achieve Goals: Good  OT Frequency: Min 2X/week    Co-evaluation              AM-PAC OT "6 Clicks" Daily Activity     Outcome Measure Help from another person eating meals?: None Help from another person taking care of personal grooming?: A  Little Help from another person toileting, which includes using toliet, bedpan, or urinal?: A Little Help from another person bathing (including washing, rinsing, drying)?: A Little Help from another person to put on and taking off regular upper body clothing?: None Help from another person to put on and taking off regular lower body clothing?: A Little 6 Click Score: 20   End of Session Equipment Utilized During Treatment: Rolling walker (2 wheels);Oxygen Nurse Communication: Mobility status  Activity Tolerance: Patient tolerated treatment well Patient left: in bed;with call bell/phone within reach  OT Visit Diagnosis: Unsteadiness on feet (R26.81);Other abnormalities of gait and mobility (R26.89);Muscle weakness (generalized) (M62.81)                Time: 8657-8469 OT Time Calculation (min): 23 min Charges:  OT General Charges $OT Visit: 1 Visit OT Evaluation $OT Eval Moderate Complexity: 1 Mod  Bradd Canary, OTR/L Acute Rehab Services Office: [phone]   Lorre Munroe 09/24/2022, 9:43 AM

## 2022-09-24 NOTE — TOC Progression Note (Signed)
Transition of Care [facility]) - Progression Note    Patient Details  Name: [name] [name] MRN: [id number] Date of Birth: [birth year]  Transition of Care [facility]) CM/SW Contact  Harriet Masson, RN Phone Number: 09/24/2022, 1:25 PM  Clinical Narrative:    NIV order has been signed by MD and emailed back to [facility] with [employer].  Patient will also need home 02 at discharge.  Patient continues to find a safe disposition at discharge.  TOC following.      Barriers to Discharge: Continued Medical Work up  Expected Discharge Plan and Services       Living arrangements for the past 2 months: Homeless Shelter                 DME Arranged: NIV DME Agency: [employer] Date DME Agency Contacted: 09/24/22 Time DME Agency Contacted: 1325 Representative spoke with at DME Agency: Vaughan Basta             Social Determinants of Health (SDOH) Interventions SDOH Screenings   Food Insecurity: Food Insecurity Present (09/17/2022)  Housing: High Risk (09/17/2022)  Transportation Needs: Unmet Transportation Needs (09/17/2022)  Utilities: At Risk (09/17/2022)  Alcohol Screen: Low Risk  (12/18/2018)  Depression (PHQ2-9): Medium Risk (07/30/2020)  Financial Resource Strain: Low Risk  (05/16/2022)  Tobacco Use: High Risk (09/17/2022)    Readmission Risk Interventions    09/02/2022    2:41 PM 08/28/2022    3:52 PM 08/22/2022    3:19 PM  Readmission Risk Prevention Plan  Transportation Screening Complete Complete Complete  Medication Review Oceanographer) Complete Complete Complete  PCP or Specialist appointment within 3-5 days of discharge Complete Complete Complete  HRI or Home Care Consult Complete Complete Complete  SW Recovery Care/Counseling Consult   Complete  Palliative Care Screening Not Applicable Not Applicable Not Applicable  Skilled Nursing Facility Not Applicable Not Applicable Not Applicable

## 2022-09-24 NOTE — Progress Notes (Signed)
   NAME:  [name] [name], MRN:  [id number], DOB:  1970/11/06, LOS: 7 ADMISSION DATE:  09/16/2022, CONSULTATION DATE: 14 2024 REFERRING MD: [facility], CHIEF COMPLAINT: Acute respiratory failure  History of Present Illness:  52 year old female with extensive past medical history is well-documented below pulmonary critical care called to bedside for acute exacerbation of COPD and acute respiratory failure.  She was transferred to intensive care unit urgently intubated.  Chest x-ray shows proper placement of NG tube and endotracheal tube.  She will be maintained in the intensive care unit until stabilized.  Pertinent  Medical History   Past Medical History:  Diagnosis Date   Anxiety disorder    Asthma    Bipolar affective disorder    Blood transfusion without reported diagnosis    CHF (congestive heart failure)    COPD (chronic obstructive pulmonary disease)    Depression    Diabetes mellitus    Migraine    Schizophrenia     Significant Hospital Events: Including procedures, antibiotic start and stop dates in addition to other pertinent events   4/10 transferred to ICU for respiratory failure 4/10 intubated, central line placed -> right sided pneumothorax and chest tube placed 4/13 extubated 4/14 transferred to [facility] 4/15 no recurrence of ptx on water seal - chest tube removed.   Interim History / Subjective:  Doing well with complaints.   Objective   Blood pressure (!) 107/58, pulse 84, temperature 98.6 F (37 C), temperature source Oral, resp. rate 20, height 5\' 6"  (1.676 m), weight 81.3 kg, SpO2 97 %.    FiO2 (%):  [60 %] 60 %   Intake/Output Summary (Last 24 hours) at 09/24/2022 0753 Last data filed at 09/23/2022 2009 Gross per 24 hour  Intake 1020 ml  Output 1260 ml  Net -240 ml    Filed Weights   09/23/22 0426 09/23/22 2200 09/24/22 0500  Weight: 83.4 kg 81.3 kg 81.3 kg   Examination: General: chronically ill appearing Lungs: Bilateral wheezing.  Right chest tube in  place without air leak on water sea Cardiovascular: RRR no murmurs/gallops/rubs Abdomen: soft, non-distended Extremities: warm, trace  edema Neuro: follows commands, grossly nonfocal, attends to conversation  No recurrent pneumothorax on waterseal.   Assessment & Plan:   # Acute metabolic encephalopathy, improving # Acute hypoxic respiratory failure, multifactorial - AECOPD, volume overload # Suspected COPD exacerbation, rhinovirus infection # Acute on chronic systolic heart failure, underlying NICM, EF 20-25% # Right sided pneumothorax following central line placement # hypotension, sedation related  # Schizophrenia   P:  - continue bronchodilators - tobacco cessation.  - wean O2 to keep sat >88-95%. Hypoxia and reactive airways will be slow to resolve following rhinovirus infection.  - pull chest tube today.  - follow clinically. CXR as needed if SOB increases.  - PCCM will sign off. Please call back if questions arise.              Best Practice (right click and "Reselect all SmartList Selections" daily)   Diet/type: regular diet DVT prophylaxis: LMWH GI prophylaxis: PPI Lines: central line - discontinue today Foley:  discontinue today Code Status:  full code  Lynnell Catalan, MD [facility] ICU Physician [facility]  Pager: [phone] Or Epic Secure Chat After hours: [phone].  09/24/2022, 7:53 AM

## 2022-09-25 DIAGNOSIS — I10 Essential (primary) hypertension: Secondary | ICD-10-CM | POA: Diagnosis not present

## 2022-09-25 DIAGNOSIS — J441 Chronic obstructive pulmonary disease with (acute) exacerbation: Secondary | ICD-10-CM | POA: Diagnosis not present

## 2022-09-25 DIAGNOSIS — I5023 Acute on chronic systolic (congestive) heart failure: Secondary | ICD-10-CM | POA: Diagnosis not present

## 2022-09-25 DIAGNOSIS — N179 Acute kidney failure, unspecified: Secondary | ICD-10-CM | POA: Diagnosis not present

## 2022-09-25 LAB — GLUCOSE, CAPILLARY
Glucose-Capillary: 155 mg/dL — ABNORMAL HIGH (ref 70–99)
Glucose-Capillary: 237 mg/dL — ABNORMAL HIGH (ref 70–99)
Glucose-Capillary: 440 mg/dL — ABNORMAL HIGH (ref 70–99)
Glucose-Capillary: 405 mg/dL — ABNORMAL HIGH (ref 70–99)

## 2022-09-25 LAB — CBC
HCT: 37 % (ref 36.0–46.0)
Hemoglobin: 11.9 g/dL — ABNORMAL LOW (ref 12.0–15.0)
MCH: 28.7 pg (ref 26.0–34.0)
MCHC: 32.2 g/dL (ref 30.0–36.0)
MCV: 89.2 fL (ref 80.0–100.0)
Platelets: 143 10*3/uL — ABNORMAL LOW (ref 150–400)
RBC: 4.15 MIL/uL (ref 3.87–5.11)
RDW: 14.2 % (ref 11.5–15.5)
WBC: 8.5 10*3/uL (ref 4.0–10.5)
nRBC: 0 % (ref 0.0–0.2)

## 2022-09-25 LAB — BASIC METABOLIC PANEL
Anion gap: 12 (ref 5–15)
BUN: 22 mg/dL — ABNORMAL HIGH (ref 6–20)
CO2: 29 mmol/L (ref 22–32)
Calcium: 8.5 mg/dL — ABNORMAL LOW (ref 8.9–10.3)
Chloride: 94 mmol/L — ABNORMAL LOW (ref 98–111)
Creatinine, Ser: 0.89 mg/dL (ref 0.44–1.00)
GFR, Estimated: >60 mL/min (ref >60)
Glucose, Bld: 200 mg/dL — ABNORMAL HIGH (ref 70–99)
Potassium: 4 mmol/L (ref 3.5–5.1)
Sodium: 135 mmol/L (ref 135–145)

## 2022-09-25 LAB — MAGNESIUM: Magnesium: 2 mg/dL (ref 1.7–2.4)

## 2022-09-25 MED ORDER — INSULIN ASPART 100 UNIT/ML IJ SOLN
10.0000 [IU] | Freq: Once | INTRAMUSCULAR | Status: AC
  Administered 2022-09-25: 10 [IU] via SUBCUTANEOUS

## 2022-09-25 NOTE — TOC Progression Note (Signed)
Transition of Care [facility]) - Progression Note    Patient Details  Name: ESABELLA BLANDING MRN: [id number] Date of Birth: 31-May-1971  Transition of Care [facility]) CM/SW Contact  Leone Haven, RN Phone Number: 09/25/2022, 11:45 AM  Clinical Narrative:    Per Vaughan Basta with [employer], the respiratory therapist will be here at 12:30 to set up patient's NIV in the room.       Barriers to Discharge: Continued Medical Work up  Expected Discharge Plan and Services       Living arrangements for the past 2 months: Homeless Shelter                 DME Arranged: NIV DME Agency: [employer] Date DME Agency Contacted: 09/24/22 Time DME Agency Contacted: 1325 Representative spoke with at DME Agency: Vaughan Basta             Social Determinants of Health (SDOH) Interventions SDOH Screenings   Food Insecurity: Food Insecurity Present (09/17/2022)  Housing: High Risk (09/17/2022)  Transportation Needs: Unmet Transportation Needs (09/17/2022)  Utilities: At Risk (09/17/2022)  Alcohol Screen: Low Risk  (12/18/2018)  Depression (PHQ2-9): Medium Risk (07/30/2020)  Financial Resource Strain: Low Risk  (05/16/2022)  Tobacco Use: High Risk (09/17/2022)    Readmission Risk Interventions    09/02/2022    2:41 PM 08/28/2022    3:52 PM 08/22/2022    3:19 PM  Readmission Risk Prevention Plan  Transportation Screening Complete Complete Complete  Medication Review Oceanographer) Complete Complete Complete  PCP or Specialist appointment within 3-5 days of discharge Complete Complete Complete  HRI or Home Care Consult Complete Complete Complete  SW Recovery Care/Counseling Consult   Complete  Palliative Care Screening Not Applicable Not Applicable Not Applicable  Skilled Nursing Facility Not Applicable Not Applicable Not Applicable

## 2022-09-25 NOTE — Progress Notes (Signed)
OT Cancellation Note  Patient Details Name: [name] [name] MRN: [id number] DOB: 1970-08-16   Cancelled Treatment:    Reason Eval/Treat Not Completed: Fatigue/lethargy limiting ability to participate Sleeping on entry and did not awaken to voice. OT to follow up as schedule permits.  Lorre Munroe 09/25/2022, 9:33 AM

## 2022-09-25 NOTE — Progress Notes (Signed)
Progress Note   Patient: [name] [name] FXO:[id number] DOB: 04/03/1971 DOA: 09/16/2022     8 DOS: the patient was seen and examined on 09/25/2022   Brief hospital course: Mrs. Wilmore was admitted to the hospital with the working diagnosis of heart failure exacerbation.  52 yo female with the past medical history of COPD, heart failure, T2DM, CKD, anxiety and hypothyroid who presented with dyspnea. Patient reported 2 days of progressive dyspnea, with wheezing, with no PND, orthopnea or lower extremity edema. Because persistent dyspnea she called EMS, she was found in distress, received IV methylprednisolone and bronchodilator therapy and transported to the ED. On her initial physical examination her blood pressure was 98/64, HR 105, RR 30 and 02 saturation 92%, heart with S1 and S2 present and rhythmic, respiratory with bilateral diffuse wheezing and rales, abdomen soft and trace lower extremity edema.   Na 139, K 4,2 Cl 98, bicarbonate 28, glucose 270, bun 20 cr 1,39 Mg 1,7 BNP 526 High sensitive troponin 49 and 53  Wbc 6,0 hgb 12,2 plt 195   EKG 104 bpm, left axis deviation, left anterior fascicular block, qtc 502, sinus rhythm, left atrial enlargement, with poor R R wave progression, no significant ST segment or T wave changes.  Positive PVC and positive LVH.   Chest radiograph with mild cardiomegaly, with bilateral hilar vascular congestions.   Patient was placed on bronchodilator therapy and received IV fluids for hypotension 500 cc LR. Then received furosemide 80 mg IV, with improvement in her symptoms.   04/09 acute worsening in dyspnea, increased work of breathing and tachycardia.  Repeated dose of furosemide 80 mg IV and placed on non invasive mechanical ventilation.  04/10 patient with worsening respiratory failure and was placed on invasive mechanical ventilation.   [facility] line was placed. Positive right pneumothorax, placed chest tube.  Positive for rhinovirus, treated  with steroids and bronchodilator therapy.  04/13 liberated from mechanical ventilation.  04/15 transferred to [facility].    Assessment and Plan: * Acute exacerbation of chronic obstructive pulmonary disease (COPD) Patient with improvement in respiratory failure, hypoxemic and hypercapnic.  Rhinovirus.  Continue with bronchodilator therpay.  Oral corticosteroid therapy with prednisone 40 mg daily. Continue inhaled corticosteroids.  Airway clearing techniques.   Right pneumothorax, chest tube has been removed.   Acute on chronic systolic CHF (congestive heart failure) Echocardiogram with reduced LV systolic function with EF 20 to 25%, LV cavity with moderate dilatation, no LVH, RV systolic function is preserved, no pericardial effusion, no significant valvular disease.   Clinically euvolemic. Continue blood pressure monitoring.  Diuretic therapy on bumetanide.    Acute kidney injury superimposed on chronic kidney disease Volume status has been improving.  Renal function with serum cr at 0,89 with K at 4,0 and serum bicarbonate at 29, Na 135 and Mg 2,0  Plan to continue bumetanide.   Essential hypertension Resume isosorbide and continue metoprolol for blood pressure control.   Type 2 diabetes mellitus with hyperlipidemia Uncontrolled with Hyperglycemia.   Continue glucose cover and monitoring with insulin sliding scale. Increase dose of basal insulin to 12 mg bid.  Continue statin therapy.  Hyperthyroidism Continue with methimazole.   Bipolar 1 disorder Resume risperidone.         Subjective: Patient is feeling better, continue very weak and deconditioned, not back to baseline, no chest pain.   Physical Exam: Vitals:   09/25/22 0509 09/25/22 0723 09/25/22 0732 09/25/22 1219  BP: 100/63  94/64 93/68  Pulse: 86  87  98  Resp: Temp: 98.6 F (37 C)  98.8 F (37.1 C) 98.7 F (37.1 C)  TempSrc: Oral  Oral Oral  SpO2: 96% 97% 95% 92%  Weight: 81.5 kg      Height:       Neurology awake and alert ENT with mild pallor Cardiovascular with S1 and S2 present and rhythmic with no gallops, rubs or murmurs Respiratory with prolonged expiratory phase and expiratory wheezing with no rales.  Abdomen with no distention No lower extremity edema  Data Reviewed:    Family Communication: no family at the bedside   Disposition: Status is: Inpatient Remains inpatient appropriate because: recovering for respiratory failure   Planned Discharge Destination: Home    Author: Coralie Keens, MD 09/25/2022 2:48 PM  For on call review [email or web address].

## 2022-09-25 NOTE — Plan of Care (Signed)
  Problem: Education: Goal: Understanding of cardiac disease, CV risk reduction, and recovery process will improve Outcome: Progressing   

## 2022-09-25 NOTE — Progress Notes (Signed)
Occupational Therapy Treatment Patient Details Name: [name] [name] MRN: [id number] DOB: November 13, 1970 Today's Date: 09/25/2022   History of present illness Pt is a 52 y/o female admitted with acute exacerbation of COPD. Noted with R pneumothorax with chest tube placed; removed 4/16. Rhinovirus+. Intubated 4/10-4/13. PMH includes: nonischemic cardiomyopathy, severe MR, schizoaffective disorder, COPD/asthma, cigarette smoker, IIDM.   OT comments  Pt reporting nausea this AM but agreeable for UE HEP education. With min cues, pt able to return demo 4/4 UE exercises and maintain sats at 91% on 4 L O2 during activity. Provided written HEP handout to maximize carryover.   Recommendations for follow up therapy are one component of a multi-disciplinary discharge planning process, led by the attending physician.  Recommendations may be updated based on patient status, additional functional criteria and insurance authorization.    Assistance Recommended at Discharge PRN  Patient can return home with the following      Equipment Recommendations  None recommended by OT    Recommendations for Other Services      Precautions / Restrictions Precautions Precautions: Fall Precaution Comments: monitor O2 Restrictions Weight Bearing Restrictions: No       Mobility Bed Mobility Overal bed mobility: Modified Independent Bed Mobility: Supine to Sit, Sit to Supine     Supine to sit: Modified independent (Device/Increase time) Sit to supine: Modified independent (Device/Increase time)        Transfers                         Balance Overall balance assessment: Needs assistance Sitting-balance support: No upper extremity supported, Feet supported Sitting balance-Leahy Scale: Good                                     ADL either performed or assessed with clinical judgement   ADL Overall ADL's : Needs assistance/impaired                                        General ADL Comments: Emphasis on UE HEP education to maximize strength outside of therapy sessions. Pt reports previously tying stretchy tourniquets together to make a theraband.    Extremity/Trunk Assessment Upper Extremity Assessment Upper Extremity Assessment: Overall WFL for tasks assessed   Lower Extremity Assessment Lower Extremity Assessment: Defer to PT evaluation        Vision       Perception     Praxis      Cognition Arousal/Alertness: Awake/alert Behavior During Therapy: WFL for tasks assessed/performed Overall Cognitive Status: Within Functional Limits for tasks assessed                                          Exercises      Shoulder Instructions       General Comments      Pertinent Vitals/ Pain       Pain Assessment Pain Assessment: Faces Faces Pain Scale: No hurt Pain Intervention(s): Monitored during session  Home Living  Prior Functioning/Environment              Frequency  Min 2X/week        Progress Toward Goals  OT Goals(current goals can now be found in the care plan section)  Progress towards OT goals: Progressing toward goals  Acute Rehab OT Goals Patient Stated Goal: for nausea to go away OT Goal Formulation: With patient Time For Goal Achievement: 10/08/22 Potential to Achieve Goals: Good ADL Goals Pt/caregiver will Perform Home Exercise Program: Increased strength;With theraband;Independently;With written HEP provided;Both right and left upper extremity Additional ADL Goal #1: Pt to verbalize at least 3 energy conservation strategies to implement during daily tasks Additional ADL Goal #2: Pt to increase standing activity tolerance > 10 min during ADLs/mobility without seated rest break  Plan Discharge plan remains appropriate    Co-evaluation                 AM-PAC OT "6 Clicks" Daily Activity     Outcome Measure    Help from another person eating meals?: None Help from another person taking care of personal grooming?: A Little Help from another person toileting, which includes using toliet, bedpan, or urinal?: A Little Help from another person bathing (including washing, rinsing, drying)?: A Little Help from another person to put on and taking off regular upper body clothing?: None Help from another person to put on and taking off regular lower body clothing?: A Little 6 Click Score: 20    End of Session Equipment Utilized During Treatment: Oxygen  OT Visit Diagnosis: Unsteadiness on feet (R26.81);Other abnormalities of gait and mobility (R26.89);Muscle weakness (generalized) (M62.81)   Activity Tolerance Patient tolerated treatment well   Patient Left in bed;with call bell/phone within reach   Nurse Communication          Time: 1047-1100 OT Time Calculation (min): 13 min  Charges: OT General Charges $OT Visit: 1 Visit OT Treatments $Therapeutic Exercise: 8-22 mins  Bradd Canary, OTR/L Acute Rehab Services Office: [phone]   Lorre Munroe 09/25/2022, 11:16 AM

## 2022-09-26 DIAGNOSIS — N179 Acute kidney failure, unspecified: Secondary | ICD-10-CM | POA: Diagnosis not present

## 2022-09-26 DIAGNOSIS — I5023 Acute on chronic systolic (congestive) heart failure: Secondary | ICD-10-CM | POA: Diagnosis not present

## 2022-09-26 DIAGNOSIS — J441 Chronic obstructive pulmonary disease with (acute) exacerbation: Secondary | ICD-10-CM | POA: Diagnosis not present

## 2022-09-26 DIAGNOSIS — I10 Essential (primary) hypertension: Secondary | ICD-10-CM | POA: Diagnosis not present

## 2022-09-26 LAB — GLUCOSE, CAPILLARY
Glucose-Capillary: 114 mg/dL — ABNORMAL HIGH (ref 70–99)
Glucose-Capillary: 200 mg/dL — ABNORMAL HIGH (ref 70–99)
Glucose-Capillary: 345 mg/dL — ABNORMAL HIGH (ref 70–99)
Glucose-Capillary: 285 mg/dL — ABNORMAL HIGH (ref 70–99)

## 2022-09-26 MED ORDER — METOPROLOL SUCCINATE ER 25 MG PO TB24
12.5000 mg | ORAL_TABLET | Freq: Every day | ORAL | Status: DC
  Administered 2022-09-26 – 2022-09-30 (×4): 12.5 mg via ORAL
  Filled 2022-09-26 (×5): qty 1

## 2022-09-26 MED ORDER — GUAIFENESIN-DM 100-10 MG/5ML PO SYRP
5.0000 mL | ORAL_SOLUTION | ORAL | Status: DC | PRN
  Administered 2022-09-26 – 2022-09-27 (×3): 5 mL via ORAL
  Filled 2022-09-26 (×3): qty 5

## 2022-09-26 NOTE — Inpatient Diabetes Management (Signed)
Inpatient Diabetes Program Recommendations  AACE/ADA: New Consensus Statement on Inpatient Glycemic Control  Target Ranges:  Prepandial:   less than 140 mg/dL      Peak postprandial:   less than 180 mg/dL (1-2 hours)      Critically ill patients:  140 - 180 mg/dL    Latest Reference Range & Units 09/25/22 06:10 09/25/22 11:35 09/25/22 16:17 09/25/22 20:57 09/26/22 06:33  Glucose-Capillary 70 - 99 mg/dL 782 (H) 956 (H) 213 (H) 405 (H) 114 (H)   Review of Glycemic Control  Diabetes history: DM2 Outpatient Diabetes medications: Toujeo 24 units daily Current orders for Inpatient glycemic control: Semglee 24 units BID, Novolog 3 units TID with meals, Novolog 0-15 units TID with meals, Novolog 0-5 units QHS; Prednisone 40 mg QAM  Inpatient Diabetes Program Recommendations:    Insulin: If steroids are continued as ordered, please consider increasing meal coverage to Novolog 7 units TID with meals.  Thanks, Orlando Penner, RN, MSN, CDCES Diabetes Coordinator Inpatient Diabetes Program [phone] (Team Pager from 8am to 5pm)

## 2022-09-26 NOTE — Progress Notes (Signed)
Mobility Specialist Progress Note:   09/26/22 1000  Mobility  Activity Ambulated with assistance in hallway  Level of Assistance Contact guard assist, steadying assist  Assistive Device None  Distance Ambulated (ft) 180 ft  Activity Response Tolerated well  Mobility Referral Yes  $Mobility charge 1 Mobility   Pt agreeable to mobility session with encouragement. Required 4-6LO2 to maintain SpO2 WFL. Pt back in bed with all needs met, encouraged frequent ambulation.   Addison Lank Mobility Specialist Please contact via SecureChat or  Rehab office at [phone]

## 2022-09-26 NOTE — Progress Notes (Signed)
Consult received to assess post CVC site. RN concerned stitch remains. This nurse assessed post line site. Removed 1 stitch. No bleeding or other concerns noted at this time. Patient had removed drsg earlier d/t itching. Patient RN at bedside. Tomasita Morrow, RN VAST

## 2022-09-26 NOTE — Progress Notes (Signed)
Progress Note   Patient: [name] [name] ZOX:[id number] DOB: 1970/11/16 DOA: 09/16/2022     9 DOS: the patient was seen and examined on 09/26/2022   Brief hospital course: Mrs. Manfredi was admitted to the hospital with the working diagnosis of heart failure exacerbation.  52 yo female with the past medical history of COPD, heart failure, T2DM, CKD, anxiety and hypothyroid who presented with dyspnea. Patient reported 2 days of progressive dyspnea, with wheezing, with no PND, orthopnea or lower extremity edema. Because persistent dyspnea she called EMS, she was found in distress, received IV methylprednisolone and bronchodilator therapy and transported to the ED. On her initial physical examination her blood pressure was 98/64, HR 105, RR 30 and 02 saturation 92%, heart with S1 and S2 present and rhythmic, respiratory with bilateral diffuse wheezing and rales, abdomen soft and trace lower extremity edema.   Na 139, K 4,2 Cl 98, bicarbonate 28, glucose 270, bun 20 cr 1,39 Mg 1,7 BNP 526 High sensitive troponin 49 and 53  Wbc 6,0 hgb 12,2 plt 195   EKG 104 bpm, left axis deviation, left anterior fascicular block, qtc 502, sinus rhythm, left atrial enlargement, with poor R R wave progression, no significant ST segment or T wave changes.  Positive PVC and positive LVH.   Chest radiograph with mild cardiomegaly, with bilateral hilar vascular congestions.   Patient was placed on bronchodilator therapy and received IV fluids for hypotension 500 cc LR. Then received furosemide 80 mg IV, with improvement in her symptoms.   04/09 acute worsening in dyspnea, increased work of breathing and tachycardia.  Repeated dose of furosemide 80 mg IV and placed on non invasive mechanical ventilation.  04/10 patient with worsening respiratory failure and was placed on invasive mechanical ventilation.   RIJ Central line was placed. Positive right pneumothorax, placed chest tube.  Positive for rhinovirus, treated  with steroids and bronchodilator therapy.  04/13 liberated from mechanical ventilation.  04/15 transferred to [facility].    Assessment and Plan: * Acute exacerbation of chronic obstructive pulmonary disease (COPD) Patient with improvement in respiratory failure, hypoxemic and hypercapnic.  Rhinovirus.  Continue with bronchodilator therpay.  Oral corticosteroid therapy with prednisone 40 mg daily complete for 3 days.  Continue inhaled corticosteroids.  Airway clearing techniques.   Right pneumothorax, chest tube has been removed.   Acute on chronic systolic CHF (congestive heart failure) Echocardiogram with reduced LV systolic function with EF 20 to 25%, LV cavity with moderate dilatation, no LVH, RV systolic function is preserved, no pericardial effusion, no significant valvular disease.   Clinically euvolemic. Continue blood pressure monitoring.  Diuretic therapy on bumetanide.   Resume low dose metoprolol   Acute kidney injury superimposed on chronic kidney disease Clinically euvolemic, follow up renal function in am,   Plan to continue bumetanide.   Essential hypertension Resume low dose metoprolol.   Type 2 diabetes mellitus with hyperlipidemia Uncontrolled with Hyperglycemia.   Continue glucose cover and monitoring with insulin sliding scale. Increase dose of basal insulin to 12 mg bid. Capillary glucose 114 and 200  Continue statin therapy.  Hyperthyroidism Continue with methimazole.   Bipolar 1 disorder Resume risperidone.         Subjective: Patient with improving in dyspnea and mobility, she has been ambulating in the hallway   Physical Exam: Vitals:   09/26/22 0450 09/26/22 0854 09/26/22 1146 09/26/22 1632  BP: 113/74 (!) 89/42 112/62 108/62  Pulse:  90 100 92  Resp:  Temp:  98.5 F (36.9 C) 98.7 F (37.1 C) 98.5 F (36.9 C)  TempSrc:  Oral  Oral  SpO2:  91% 91% 95%  Weight:      Height:       Neurology awake and alert ENT with mild  pallor Cardiovascular with S1 and S2 present and rhythmic Respiratory with no wheezing or rhonchi Abdomen with no distention  No lower extremity edema  Data Reviewed:    Family Communication: no family at the bedside   Disposition: Status is: Inpatient Remains inpatient appropriate because: recovering respiratory failure   Planned Discharge Destination: Home     Author: Coralie Keens, MD 09/26/2022 4:36 PM  For on call review [email or web address].

## 2022-09-26 NOTE — Progress Notes (Signed)
Physical Therapy Treatment Patient Details Name: [name] [name] MRN: [id number] DOB: 1971-04-15 Today's Date: 09/26/2022   History of Present Illness Pt is a 52 y/o female admitted with acute exacerbation of COPD. Noted with R pneumothorax with chest tube placed; removed 4/16. Rhinovirus+. Intubated 4/10-4/13. PMH includes: nonischemic cardiomyopathy, severe MR, schizoaffective disorder, COPD/asthma, cigarette smoker, IIDM.    PT Comments    Pt tolerated treatment well today. Pt was able to ambulate in hallway with no AD at min guard level. Pt received on 4L at 91%. Pt titrated to 6L during ambulation satting at 95%. Pt left on 4L at 93%. No change in DC/DME recs at this time. PT will continue to follow.    Recommendations for follow up therapy are one component of a multi-disciplinary discharge planning process, led by the attending physician.  Recommendations may be updated based on patient status, additional functional criteria and insurance authorization.  Follow Up Recommendations  Can patient physically be transported by private vehicle: Yes    Assistance Recommended at Discharge PRN  Patient can return home with the following A little help with walking and/or transfers;A little help with bathing/dressing/bathroom   Equipment Recommendations  None recommended by PT    Recommendations for Other Services       Precautions / Restrictions Precautions Precautions: Fall Precaution Comments: monitor O2 Restrictions Weight Bearing Restrictions: No     Mobility  Bed Mobility Overal bed mobility: Modified Independent Bed Mobility: Supine to Sit, Sit to Supine     Supine to sit: Modified independent (Device/Increase time) Sit to supine: Modified independent (Device/Increase time)        Transfers Overall transfer level: Independent Equipment used: None Transfers: Sit to/from Stand Sit to Stand: Independent                Ambulation/Gait Ambulation/Gait  assistance: Min guard Gait Distance (Feet): 200 Feet Assistive device: None Gait Pattern/deviations: Antalgic, Step-through pattern, Decreased stride length, Decreased stance time - left Gait velocity: decr     General Gait Details: no LOB noted.   Stairs             Wheelchair Mobility    Modified Rankin (Stroke Patients Only)       Balance Overall balance assessment: Mild deficits observed, not formally tested                                          Cognition Arousal/Alertness: Awake/alert Behavior During Therapy: WFL for tasks assessed/performed Overall Cognitive Status: Within Functional Limits for tasks assessed                                          Exercises      General Comments General comments (skin integrity, edema, etc.): Pt received on 4L at 91%. Pt titrated to 6L during ambulation satting at 95%. Pt left on 4L at 93%.      Pertinent Vitals/Pain Pain Assessment Pain Assessment: No/denies pain    Home Living                          Prior Function            PT Goals (current goals can now be found in the care plan section) Progress towards  PT goals: Progressing toward goals    Frequency    Min 1X/week      PT Plan Current plan remains appropriate    Co-evaluation              AM-PAC PT "6 Clicks" Mobility   Outcome Measure  Help needed turning from your back to your side while in a flat bed without using bedrails?: None Help needed moving from lying on your back to sitting on the side of a flat bed without using bedrails?: None Help needed moving to and from a bed to a chair (including a wheelchair)?: None Help needed standing up from a chair using your arms (e.g., wheelchair or bedside chair)?: None Help needed to walk in hospital room?: A Little Help needed climbing 3-5 steps with a railing? : A Little 6 Click Score: 22    End of Session Equipment Utilized During  Treatment: Gait belt;Oxygen Activity Tolerance: Patient tolerated treatment well Patient left: in bed;with call bell/phone within reach Nurse Communication: Mobility status PT Visit Diagnosis: Other abnormalities of gait and mobility (R26.89);Muscle weakness (generalized) (M62.81)     Time: 1610-9604 PT Time Calculation (min) (ACUTE ONLY): 11 min  Charges:  $Gait Training: 8-22 mins                     Shela Nevin, PT, DPT Acute Rehab Services [phone]    Gladys Damme 09/26/2022, 4:13 PM

## 2022-09-27 ENCOUNTER — Other Ambulatory Visit (HOSPITAL_COMMUNITY): Payer: Self-pay

## 2022-09-27 DIAGNOSIS — N179 Acute kidney failure, unspecified: Secondary | ICD-10-CM | POA: Diagnosis not present

## 2022-09-27 DIAGNOSIS — I10 Essential (primary) hypertension: Secondary | ICD-10-CM | POA: Diagnosis not present

## 2022-09-27 DIAGNOSIS — I5023 Acute on chronic systolic (congestive) heart failure: Secondary | ICD-10-CM | POA: Diagnosis not present

## 2022-09-27 DIAGNOSIS — J441 Chronic obstructive pulmonary disease with (acute) exacerbation: Secondary | ICD-10-CM | POA: Diagnosis not present

## 2022-09-27 LAB — GLUCOSE, CAPILLARY
Glucose-Capillary: 130 mg/dL — ABNORMAL HIGH (ref 70–99)
Glucose-Capillary: 132 mg/dL — ABNORMAL HIGH (ref 70–99)
Glucose-Capillary: 215 mg/dL — ABNORMAL HIGH (ref 70–99)

## 2022-09-27 MED ORDER — MOMETASONE FURO-FORMOTEROL FUM 200-5 MCG/ACT IN AERO
2.0000 | INHALATION_SPRAY | Freq: Two times a day (BID) | RESPIRATORY_TRACT | 0 refills | Status: DC
  Filled 2022-09-27: qty 13, 30d supply, fill #0

## 2022-09-27 MED ORDER — MOMETASONE FURO-FORMOTEROL FUM 200-5 MCG/ACT IN AERO
2.0000 | INHALATION_SPRAY | Freq: Two times a day (BID) | RESPIRATORY_TRACT | Status: DC
  Administered 2022-09-27 – 2022-09-30 (×6): 2 via RESPIRATORY_TRACT
  Filled 2022-09-27 (×4): qty 8.8

## 2022-09-27 MED ORDER — ROSUVASTATIN CALCIUM 20 MG PO TABS
20.0000 mg | ORAL_TABLET | Freq: Every day | ORAL | 0 refills | Status: DC
  Filled 2022-09-27: qty 30, 30d supply, fill #0

## 2022-09-27 MED ORDER — METOPROLOL SUCCINATE ER 25 MG PO TB24
12.5000 mg | ORAL_TABLET | Freq: Every day | ORAL | 0 refills | Status: DC
  Filled 2022-09-27: qty 15, 30d supply, fill #0

## 2022-09-27 MED ORDER — METHIMAZOLE 5 MG PO TABS
5.0000 mg | ORAL_TABLET | Freq: Every day | ORAL | 0 refills | Status: DC
  Filled 2022-09-27: qty 21, 21d supply, fill #0

## 2022-09-27 MED ORDER — EMPAGLIFLOZIN 10 MG PO TABS
10.0000 mg | ORAL_TABLET | Freq: Every day | ORAL | Status: DC
  Administered 2022-09-27 – 2022-09-30 (×4): 10 mg via ORAL
  Filled 2022-09-27 (×4): qty 1

## 2022-09-27 MED ORDER — ALBUTEROL SULFATE HFA 108 (90 BASE) MCG/ACT IN AERS
2.0000 | INHALATION_SPRAY | Freq: Four times a day (QID) | RESPIRATORY_TRACT | 2 refills | Status: DC | PRN
  Filled 2022-09-27: qty 18, 25d supply, fill #0

## 2022-09-27 MED ORDER — INSULIN PEN NEEDLE 32G X 4 MM MISC
0 refills | Status: DC
  Filled 2022-09-27: qty 100, 30d supply, fill #0

## 2022-09-27 MED ORDER — ISOSORBIDE MONONITRATE ER 30 MG PO TB24
30.0000 mg | ORAL_TABLET | Freq: Every day | ORAL | Status: DC
  Administered 2022-09-27 – 2022-09-30 (×4): 30 mg via ORAL
  Filled 2022-09-27 (×4): qty 1

## 2022-09-27 MED ORDER — RISPERIDONE 2 MG PO TABS
1.0000 mg | ORAL_TABLET | ORAL | 0 refills | Status: DC
  Filled 2022-09-27: qty 45, 30d supply, fill #0

## 2022-09-27 MED ORDER — EMPAGLIFLOZIN 10 MG PO TABS
10.0000 mg | ORAL_TABLET | Freq: Every day | ORAL | 0 refills | Status: DC
  Filled 2022-09-27: qty 30, 30d supply, fill #0

## 2022-09-27 MED ORDER — POTASSIUM CHLORIDE CRYS ER 20 MEQ PO TBCR
20.0000 meq | EXTENDED_RELEASE_TABLET | Freq: Every day | ORAL | 0 refills | Status: DC
  Filled 2022-09-27: qty 30, 30d supply, fill #0

## 2022-09-27 MED ORDER — ACETAMINOPHEN 325 MG PO TABS
325.0000 mg | ORAL_TABLET | Freq: Four times a day (QID) | ORAL | 0 refills | Status: DC | PRN
  Filled 2022-09-27: qty 60, 15d supply, fill #0

## 2022-09-27 MED ORDER — ASPIRIN LOW DOSE 81 MG PO TBEC
81.0000 mg | DELAYED_RELEASE_TABLET | Freq: Every day | ORAL | 0 refills | Status: DC
  Filled 2022-09-27: qty 30, 30d supply, fill #0

## 2022-09-27 MED ORDER — TOUJEO SOLOSTAR 300 UNIT/ML ~~LOC~~ SOPN
24.0000 [IU] | PEN_INJECTOR | Freq: Every day | SUBCUTANEOUS | 0 refills | Status: DC
  Filled 2022-09-27: qty 3, 38d supply, fill #0

## 2022-09-27 MED ORDER — SPIRONOLACTONE 12.5 MG HALF TABLET
12.5000 mg | ORAL_TABLET | Freq: Every day | ORAL | Status: DC
  Administered 2022-09-27 – 2022-09-30 (×4): 12.5 mg via ORAL
  Filled 2022-09-27 (×4): qty 1

## 2022-09-27 MED ORDER — NITROGLYCERIN 0.4 MG SL SUBL
0.4000 mg | SUBLINGUAL_TABLET | SUBLINGUAL | 0 refills | Status: DC | PRN
  Filled 2022-09-27: qty 25, 7d supply, fill #0

## 2022-09-27 MED ORDER — BUMETANIDE 2 MG PO TABS
2.0000 mg | ORAL_TABLET | Freq: Two times a day (BID) | ORAL | 0 refills | Status: DC
  Filled 2022-09-27: qty 60, 30d supply, fill #0

## 2022-09-27 MED ORDER — LORAZEPAM 1 MG PO TABS: 1.0000 mg | ORAL_TABLET | Freq: Four times a day (QID) | ORAL | Status: DC | PRN

## 2022-09-27 MED ORDER — ISOSORBIDE MONONITRATE ER 30 MG PO TB24
30.0000 mg | ORAL_TABLET | Freq: Every day | ORAL | 0 refills | Status: DC
  Filled 2022-09-27: qty 30, 30d supply, fill #0

## 2022-09-27 MED ORDER — SPIRONOLACTONE 25 MG PO TABS
12.5000 mg | ORAL_TABLET | Freq: Every day | ORAL | 0 refills | Status: DC
  Filled 2022-09-27: qty 15, 30d supply, fill #0

## 2022-09-27 NOTE — Progress Notes (Signed)
Progress Note   Patient: [name] [name] XLK:[id number] DOB: 24-Apr-1971 DOA: 09/16/2022     10 DOS: the patient was seen and examined on 09/27/2022   Brief hospital course: Mrs. Baskins was admitted to the hospital with the working diagnosis of heart failure exacerbation.  52 yo female with the past medical history of COPD, heart failure, T2DM, CKD, anxiety and hypothyroid who presented with dyspnea. Patient reported 2 days of progressive dyspnea, with wheezing, with no PND, orthopnea or lower extremity edema. Because persistent dyspnea she called EMS, she was found in distress, received IV methylprednisolone and bronchodilator therapy and transported to the ED. On her initial physical examination her blood pressure was 98/64, HR 105, RR 30 and 02 saturation 92%, heart with S1 and S2 present and rhythmic, respiratory with bilateral diffuse wheezing and rales, abdomen soft and trace lower extremity edema.   Na 139, K 4,2 Cl 98, bicarbonate 28, glucose 270, bun 20 cr 1,39 Mg 1,7 BNP 526 High sensitive troponin 49 and 53  Wbc 6,0 hgb 12,2 plt 195   EKG 104 bpm, left axis deviation, left anterior fascicular block, qtc 502, sinus rhythm, left atrial enlargement, with poor R R wave progression, no significant ST segment or T wave changes.  Positive PVC and positive LVH.   Chest radiograph with mild cardiomegaly, with bilateral hilar vascular congestions.   Patient was placed on bronchodilator therapy and received IV fluids for hypotension 500 cc LR. Then received furosemide 80 mg IV, with improvement in her symptoms.   04/09 acute worsening in dyspnea, increased work of breathing and tachycardia.  Repeated dose of furosemide 80 mg IV and placed on non invasive mechanical ventilation.  04/10 patient with worsening respiratory failure and was placed on invasive mechanical ventilation.   RIJ Central line was placed. Positive right pneumothorax, placed chest tube.  Positive for rhinovirus, treated  with steroids and bronchodilator therapy.  04/13 liberated from mechanical ventilation.  04/15 transferred to [facility].  04/19 patient has been clinically improving, she has not required non invasive mechanical ventilation over the last 4 days, while on the medical ward. Patient will not need non invasive mechanical ventilation at her discharge. Plan to discharge to shelter facility tomorrow.    Assessment and Plan: * Acute exacerbation of chronic obstructive pulmonary disease (COPD) Patient with improvement in respiratory failure, hypoxemic and hypercapnic.  Rhinovirus.  Continue with bronchodilator therpay.  Oral corticosteroid therapy with prednisone 40 mg daily complete for 3 days.  Continue inhaled corticosteroids.  Airway clearing techniques.   Patient has been on supplemental 02 and not requiring on invasive mechanical ventilation Will check 02 sat on room air on ambulation, no need for  home non invasive ventilation.   Right pneumothorax, chest tube has been removed.   Acute on chronic systolic CHF (congestive heart failure) Echocardiogram with reduced LV systolic function with EF 20 to 25%, LV cavity with moderate dilatation, no LVH, RV systolic function is preserved, no pericardial effusion, no significant valvular disease.   Continue medical therapy with metoprolol. Will add spironolactone and SGLT 2 inh. Resume isosorbide low dow.  Hold on ARB for now to prevent hypotension.   Discontinue telemetry.  Acute kidney injury superimposed on chronic kidney disease Check renal function and electrolytes. Will add spironolactone and empagliflozin  Continue diuresis with bumetanide.   Essential hypertension Resume low dose metoprolol and isosorbide.  If blood pressure tolerates will add ARB.   Type 2 diabetes mellitus with hyperlipidemia Uncontrolled with Hyperglycemia.   Continue  glucose cover and monitoring with insulin sliding scale. Continue with basal insulin to 12 mg  bid. Capillary glucose 130 to 285 mg/dl.  Continue statin therapy.  Hyperthyroidism Continue with methimazole.   Bipolar 1 disorder Continue with risperidone.  Today with increased anxiety, will add as needed lorazepam while she is in the hospital.   Pressure injury of skin Pressure Injury 09/17/22 Buttocks Left;Right Stage 1 -  Intact skin with non-blanchable redness of a localized area usually over a bony prominence. On both checks (Active)  09/17/22 2000  Location: Buttocks  Location Orientation: Left;Right  Staging: Stage 1 -  Intact skin with non-blanchable redness of a localized area usually over a bony prominence.  Wound Description (Comments): On both checks  Present on Admission: Yes  Dressing Type Foam - Lift dressing to assess site every shift 09/26/22 2100          Subjective: Patient is having anxiety today no dyspnea or chest pain, no orthopnea or lower extremity edema   Physical Exam: Vitals:   09/26/22 2019 09/27/22 0032 09/27/22 0507 09/27/22 0508  BP: 117/66 (!) 101/57 (!) 108/57   Pulse: 86 89 77   Resp: Temp: 98.5 F (36.9 C)  98.3 F (36.8 C)   TempSrc: Oral  Oral   SpO2: 96% 96% 96%   Weight:    83.2 kg  Height:       Neurology awake and alert ENT with no pallor Cardiovascular with S1 and S2 present and rhythmic with no gallops, rubs or murmurs Respiratory with no rales or wheezing Abdomen with no distention  Data Reviewed:    Family Communication: no family at the bedside   Disposition: Status is: Inpatient Remains inpatient appropriate because: possible discharge tomorrow to shelter  Planned Discharge Destination:  shelter       Author: Coralie Keens, MD 09/27/2022 9:17 AM  For on call review [email or web address].

## 2022-09-27 NOTE — Progress Notes (Signed)
Nurse requested Mobility Specialist to perform oxygen saturation test with pt which includes removing pt from oxygen both at rest and while ambulating.  Below are the results from that testing.     Patient Saturations on Room Air at Rest = spO2 91%  Patient Saturations on Room Air while Ambulating = sp02 85% .   Patient Saturations on 4 Liters of oxygen while Ambulating = sp02 92%  Reported results to nurse.   Addison Lank Mobility Specialist Please contact via SecureChat or  Rehab office at [phone]

## 2022-09-27 NOTE — TOC Progression Note (Signed)
Discharge medications (16) are being stored in the main pharmacy on the ground floor until patient is ready for discharge.

## 2022-09-27 NOTE — TOC Progression Note (Addendum)
Transition of Care [facility]) - Progression Note    Patient Details  Name: [name] [name] MRN: [id number] Date of Birth: 08-23-1970  [facility] CM/SW Contact  Leone Haven, RN Phone Number: 09/27/2022, 10:36 AM  Clinical Narrative:    NCM spoke with Pennie Banter at the [facility], she states the patient can come there with oxygen, but most shelters do not allow them to have oxygen.  She is checking with her staff to see if this patient will be able to come tomorrow and will call this NCM back.  NCM received call back from [facility] with [facility], she states the patient can come tomorrow between 8 am and 3pm, they will close at 3pm.  Nita Sells will be there at 9 am and patient can ask for her.  Per MD she will not need the NIV but will need some oxygen.  [employer] supplied the NIV and will be picking this back up since patient will not need per MD.  Patient will need some oxygen and MaryAnne with [facility] states she can come there with her oyxgen.  NCM left taxi voucher on chart for transport to [facility] at dc in the am.      Barriers to Discharge: Continued Medical Work up  Expected Discharge Plan and Services       Living arrangements for the past 2 months: Homeless Shelter                 DME Arranged: NIV DME Agency: [employer] Date DME Agency Contacted: 09/24/22 Time DME Agency Contacted: 1325 Representative spoke with at DME Agency: Vaughan Basta             Social Determinants of Health (SDOH) Interventions SDOH Screenings   Food Insecurity: Food Insecurity Present (09/17/2022)  Housing: High Risk (09/17/2022)  Transportation Needs: Unmet Transportation Needs (09/17/2022)  Utilities: At Risk (09/17/2022)  Alcohol Screen: Low Risk  (12/18/2018)  Depression (PHQ2-9): Medium Risk (07/30/2020)  Financial Resource Strain: Low Risk  (05/16/2022)  Tobacco Use: High Risk (09/17/2022)    Readmission Risk Interventions    09/02/2022    2:41 PM 08/28/2022    3:52 PM 08/22/2022     3:19 PM  Readmission Risk Prevention Plan  Transportation Screening Complete Complete Complete  Medication Review Oceanographer) Complete Complete Complete  PCP or Specialist appointment within 3-5 days of discharge Complete Complete Complete  HRI or Home Care Consult Complete Complete Complete  SW Recovery Care/Counseling Consult   Complete  Palliative Care Screening Not Applicable Not Applicable Not Applicable  Skilled Nursing Facility Not Applicable Not Applicable Not Applicable

## 2022-09-27 NOTE — Plan of Care (Signed)
  Problem: Nutrition: Goal: Adequate nutrition will be maintained Outcome: Completed/Met   Problem: Safety: Goal: Ability to remain free from injury will improve Outcome: Completed/Met   Problem: Skin Integrity: Goal: Risk for impaired skin integrity will decrease Outcome: Completed/Met   

## 2022-09-27 NOTE — Progress Notes (Signed)
Mobility Specialist Progress Note:   09/27/22 1100  Mobility  Activity Ambulated with assistance in hallway  Level of Assistance Contact guard assist, steadying assist  Assistive Device None  Distance Ambulated (ft) 200 ft  Activity Response Tolerated well  Mobility Referral Yes  $Mobility charge 1 Mobility   Pt agreeable to mobility session. Required only minG assist during ambulation. 4LO2 needed to maintain SpO2 WFL. Pt back in bed with all needs met. Encouraged frequent OOB mobility.   Addison Lank Mobility Specialist Please contact via SecureChat or  Rehab office at [phone]

## 2022-09-27 NOTE — Progress Notes (Addendum)
Progress Note   Patient: [name] [name] ZOX:[id number] DOB: 08/21/1970 DOA: 09/16/2022     10 DOS: the patient was seen and examined on 09/27/2022   Brief hospital course: Mrs. Smithhart was admitted to the hospital with the working diagnosis of heart failure exacerbation.  52 yo female with the past medical history of COPD, heart failure, T2DM, CKD, anxiety and hypothyroid who presented with dyspnea. Patient reported 2 days of progressive dyspnea, with wheezing, with no PND, orthopnea or lower extremity edema. Because persistent dyspnea she called EMS, she was found in distress, received IV methylprednisolone and bronchodilator therapy and transported to the ED. On her initial physical examination her blood pressure was 98/64, HR 105, RR 30 and 02 saturation 92%, heart with S1 and S2 present and rhythmic, respiratory with bilateral diffuse wheezing and rales, abdomen soft and trace lower extremity edema.   Na 139, K 4,2 Cl 98, bicarbonate 28, glucose 270, bun 20 cr 1,39 Mg 1,7 BNP 526 High sensitive troponin 49 and 53  Wbc 6,0 hgb 12,2 plt 195   EKG 104 bpm, left axis deviation, left anterior fascicular block, qtc 502, sinus rhythm, left atrial enlargement, with poor R R wave progression, no significant ST segment or T wave changes.  Positive PVC and positive LVH.   Chest radiograph with mild cardiomegaly, with bilateral hilar vascular congestions.   Patient was placed on bronchodilator therapy and received IV fluids for hypotension 500 cc LR. Then received furosemide 80 mg IV, with improvement in her symptoms.   04/09 acute worsening in dyspnea, increased work of breathing and tachycardia.  Repeated dose of furosemide 80 mg IV and placed on non invasive mechanical ventilation.  04/10 patient with worsening respiratory failure and was placed on invasive mechanical ventilation.   RIJ Central line was placed. Positive right pneumothorax, placed chest tube.  Positive for rhinovirus, treated  with steroids and bronchodilator therapy.  04/13 liberated from mechanical ventilation.  04/15 transferred to [facility].  04/19 patient has been clinically improving, she has not required non invasive mechanical ventilation over the last 4 days, while on the medical ward. Patient will not need non invasive mechanical ventilation at her discharge. Plan to discharge to shelter facility tomorrow.   Assessment and Plan: * Acute exacerbation of chronic obstructive pulmonary disease (COPD) Patient with improvement in respiratory failure, hypoxemic and hypercapnic.  Rhinovirus.  Continue with bronchodilator therpay.  Oral corticosteroid therapy with prednisone 40 mg daily complete for 3 days.  Continue inhaled corticosteroids.  Airway clearing techniques.   Patient has been on supplemental 02 and not requiring on invasive mechanical ventilation Will check 02 sat on room air on ambulation, no need for  home non invasive ventilation.   Right pneumothorax, chest tube has been removed.   Acute on chronic systolic CHF (congestive heart failure) Echocardiogram with reduced LV systolic function with EF 20 to 25%, LV cavity with moderate dilatation, no LVH, RV systolic function is preserved, no pericardial effusion, no significant valvular disease.   Continue medical therapy with metoprolol. Will add spironolactone and SGLT 2 inh. Resume isosorbide low dow.  Hold on ARB for now to prevent hypotension.   Discontinue telemetry.  Acute kidney injury superimposed on chronic kidney disease Check renal function and electrolytes. Will add spironolactone and empagliflozin  Continue diuresis with bumetanide.   Essential hypertension Resume low dose metoprolol and isosorbide.  If blood pressure tolerates will add ARB.   Type 2 diabetes mellitus with hyperlipidemia Uncontrolled with Hyperglycemia.   Continue glucose  cover and monitoring with insulin sliding scale. Continue with basal insulin to 12 mg  bid. Capillary glucose 130 to 285 mg/dl.  Continue statin therapy.  Hyperthyroidism Continue with methimazole.   Bipolar 1 disorder Continue with risperidone.  Today with increased anxiety, will add as needed lorazepam while she is in the hospital.   Pressure injury of skin Pressure Injury 09/17/22 Buttocks Left;Right Stage 1 -  Intact skin with non-blanchable redness of a localized area usually over a bony prominence. On both checks (Active)  09/17/22 2000  Location: Buttocks  Location Orientation: Left;Right  Staging: Stage 1 -  Intact skin with non-blanchable redness of a localized area usually over a bony prominence.  Wound Description (Comments): On both checks  Present on Admission: Yes  Dressing Type Foam - Lift dressing to assess site every shift 09/26/22 2100          Subjective: Patient is having anxiety and crying all night, no chest pain and no dyspnea. She has been on supplemental 02 per [place] but not using non invasive ventilation   Physical Exam: Vitals:   09/26/22 2019 09/27/22 0032 09/27/22 0507 09/27/22 0508  BP: 117/66 (!) 101/57 (!) 108/57   Pulse: 86 89 77   Resp: Temp: 98.5 F (36.9 C)  98.3 F (36.8 C)   TempSrc: Oral  Oral   SpO2: 96% 96% 96%   Weight:    83.2 kg  Height:       Neurology awake and alert, anxious and crying ENT with mild pallor Cardiovascular with S1 and S2 present and rhythmic with no gallops, rubs or murmurs No JVD No lower extremity edema Respiratory with no wheezing or rales, no rhonchi Abdomen with no distention  Data Reviewed:    Family Communication: no family at the bedside   Disposition: Status is: Inpatient Remains inpatient appropriate because: plan for discharge tomorrow to shelter.,   Planned Discharge Destination:  shelter       Author: Coralie Keens, MD 09/27/2022 9:48 AM  For on call review [email or web address].

## 2022-09-27 NOTE — TOC Progression Note (Signed)
Transition of Care [facility]) - Progression Note    Patient Details  Name: [name] [name] MRN: [id number] Date of Birth: [birth year]  Transition of Care [facility]) CM/SW Contact  Leone Haven, RN Phone Number: 09/27/2022, 3:47 PM  Clinical Narrative:    Ambulatory sats were done, and order is in for 5 liters, NCM informed MD that  went over with Leadership and we do not think this patient can be dc tomorrow on 5 liters.  NCM informed Jermaine with [employer] as well.       Barriers to Discharge: Continued Medical Work up  Expected Discharge Plan and Services       Living arrangements for the past 2 months: Homeless Shelter                 DME Arranged: NIV DME Agency: [employer] Date DME Agency Contacted: 09/24/22 Time DME Agency Contacted: 1325 Representative spoke with at DME Agency: Vaughan Basta             Social Determinants of Health (SDOH) Interventions SDOH Screenings   Food Insecurity: Food Insecurity Present (09/17/2022)  Housing: High Risk (09/17/2022)  Transportation Needs: Unmet Transportation Needs (09/17/2022)  Utilities: At Risk (09/17/2022)  Alcohol Screen: Low Risk  (12/18/2018)  Depression (PHQ2-9): Medium Risk (07/30/2020)  Financial Resource Strain: Low Risk  (05/16/2022)  Tobacco Use: High Risk (09/17/2022)    Readmission Risk Interventions    09/02/2022    2:41 PM 08/28/2022    3:52 PM 08/22/2022    3:19 PM  Readmission Risk Prevention Plan  Transportation Screening Complete Complete Complete  Medication Review Oceanographer) Complete Complete Complete  PCP or Specialist appointment within 3-5 days of discharge Complete Complete Complete  HRI or Home Care Consult Complete Complete Complete  SW Recovery Care/Counseling Consult   Complete  Palliative Care Screening Not Applicable Not Applicable Not Applicable  Skilled Nursing Facility Not Applicable Not Applicable Not Applicable

## 2022-09-27 NOTE — Assessment & Plan Note (Signed)
Pressure Injury 09/17/22 Buttocks Left;Right Stage 1 -  Intact skin with non-blanchable redness of a localized area usually over a bony prominence. On both checks (Active)  09/17/22 2000  Location: Buttocks  Location Orientation: Left;Right  Staging: Stage 1 -  Intact skin with non-blanchable redness of a localized area usually over a bony prominence.  Wound Description (Comments): On both checks  Present on Admission: Yes  Dressing Type Foam - Lift dressing to assess site every shift 09/26/22 2100

## 2022-09-28 DIAGNOSIS — J441 Chronic obstructive pulmonary disease with (acute) exacerbation: Secondary | ICD-10-CM | POA: Diagnosis not present

## 2022-09-28 DIAGNOSIS — N179 Acute kidney failure, unspecified: Secondary | ICD-10-CM | POA: Diagnosis not present

## 2022-09-28 DIAGNOSIS — I10 Essential (primary) hypertension: Secondary | ICD-10-CM | POA: Diagnosis not present

## 2022-09-28 DIAGNOSIS — I5023 Acute on chronic systolic (congestive) heart failure: Secondary | ICD-10-CM | POA: Diagnosis not present

## 2022-09-28 LAB — GLUCOSE, CAPILLARY
Glucose-Capillary: 187 mg/dL — ABNORMAL HIGH (ref 70–99)
Glucose-Capillary: 78 mg/dL (ref 70–99)
Glucose-Capillary: 93 mg/dL (ref 70–99)
Glucose-Capillary: 147 mg/dL — ABNORMAL HIGH (ref 70–99)
Glucose-Capillary: 177 mg/dL — ABNORMAL HIGH (ref 70–99)
Glucose-Capillary: 89 mg/dL (ref 70–99)

## 2022-09-28 NOTE — Progress Notes (Signed)
Occupational Therapy Treatment Patient Details Name: [name] [name] MRN: [id number] DOB: 10/06/1970 Today's Date: 09/28/2022   History of present illness Pt is a 52 y/o female admitted with acute exacerbation of COPD. Noted with R pneumothorax with chest tube placed; removed 4/16. Rhinovirus+. Intubated 4/10-4/13. PMH includes: nonischemic cardiomyopathy, severe MR, schizoaffective disorder, COPD/asthma, cigarette smoker, IIDM.   OT comments  Patient seen this OT visit to address energy conservation strategies and BUE HEP. Patient provided with written HEP and reviewed exercises with red therapy band. Patient provided handout for energy conservation and reviewed with patient. Patient weaned to 2 liters O2 and is worried about furthering weaning. Discharge recommendations continue to be appropriate with acute OT to continue to follow.    Recommendations for follow up therapy are one component of a multi-disciplinary discharge planning process, led by the attending physician.  Recommendations may be updated based on patient status, additional functional criteria and insurance authorization.    Assistance Recommended at Discharge PRN  Patient can return home with the following  Assist for transportation   Equipment Recommendations  None recommended by OT    Recommendations for Other Services      Precautions / Restrictions Precautions Precautions: Fall Precaution Comments: monitor O2 Restrictions Weight Bearing Restrictions: No       Mobility Bed Mobility Overal bed mobility: Modified Independent             General bed mobility comments: seated on EOB upon entry and supine without assistance at end of session    Transfers Overall transfer level: Independent                 General transfer comment: states she takes self to bathroom     Balance Overall balance assessment: Mild deficits observed, not formally tested Sitting-balance support: No upper extremity  supported, Feet supported Sitting balance-Leahy Scale: Good                                     ADL either performed or assessed with clinical judgement   ADL Overall ADL's : Needs assistance/impaired                                       General ADL Comments: focused on energy conservaion and HEP    Extremity/Trunk Assessment              Vision       Perception     Praxis      Cognition Arousal/Alertness: Awake/alert Behavior During Therapy: WFL for tasks assessed/performed Overall Cognitive Status: Within Functional Limits for tasks assessed                                 General Comments: concerned about being weened off oxygen        Exercises Exercises: General Upper Extremity General Exercises - Upper Extremity Shoulder ABduction: Strengthening, Both, 10 reps, Seated, Theraband Theraband Level (Shoulder Abduction): Level 2 (Red) Elbow Flexion: Strengthening, Both, 10 reps, Seated, Theraband Theraband Level (Elbow Flexion): Level 2 (Red) Elbow Extension: Strengthening, Both, 10 reps, Seated, Theraband Theraband Level (Elbow Extension): Level 2 (Red)    Shoulder Instructions       General Comments Provided handout for energy conservation and reviewed with patient. SpO2 95  on 2 liters    Pertinent Vitals/ Pain       Pain Assessment Pain Assessment: Faces Faces Pain Scale: Hurts a little bit Pain Location: back Pain Descriptors / Indicators: Grimacing, Sore Pain Intervention(s): Limited activity within patient's tolerance, Monitored during session  Home Living                                          Prior Functioning/Environment              Frequency  Min 2X/week        Progress Toward Goals  OT Goals(current goals can now be found in the care plan section)  Progress towards OT goals: Progressing toward goals  Acute Rehab OT Goals Patient Stated Goal: get better OT  Goal Formulation: With patient Time For Goal Achievement: 10/08/22 Potential to Achieve Goals: Good ADL Goals Pt/caregiver will Perform Home Exercise Program: Increased strength;With theraband;Independently;With written HEP provided;Both right and left upper extremity Additional ADL Goal #1: Pt to verbalize at least 3 energy conservation strategies to implement during daily tasks Additional ADL Goal #2: Pt to increase standing activity tolerance > 10 min during ADLs/mobility without seated rest break  Plan Discharge plan remains appropriate    Co-evaluation                 AM-PAC OT "6 Clicks" Daily Activity     Outcome Measure   Help from another person eating meals?: None Help from another person taking care of personal grooming?: A Little Help from another person toileting, which includes using toliet, bedpan, or urinal?: A Little Help from another person bathing (including washing, rinsing, drying)?: A Little Help from another person to put on and taking off regular upper body clothing?: None Help from another person to put on and taking off regular lower body clothing?: A Little 6 Click Score: 20    End of Session Equipment Utilized During Treatment: Oxygen  OT Visit Diagnosis: Unsteadiness on feet (R26.81);Other abnormalities of gait and mobility (R26.89);Muscle weakness (generalized) (M62.81)   Activity Tolerance Patient tolerated treatment well   Patient Left in bed;with call bell/phone within reach   Nurse Communication Mobility status        Time: 7829-5621 OT Time Calculation (min): 28 min  Charges: OT General Charges $OT Visit: 1 Visit OT Treatments $Therapeutic Activity: 8-22 mins $Therapeutic Exercise: 8-22 mins  Alfonse Flavors, OTA Acute Rehabilitation Services  Office [phone]   Dewain Penning 09/28/2022, 1:36 PM

## 2022-09-28 NOTE — Progress Notes (Signed)
Mobility Specialist Progress Note    09/28/22 1201  Mobility  Activity Ambulated with assistance in hallway  Level of Assistance Contact guard assist, steadying assist  Assistive Device Other (Comment) (HHA)  Distance Ambulated (ft) 250 ft  Activity Response Tolerated well  Mobility Referral Yes  $Mobility charge 1 Mobility   Pt received sitting EOB and agreeable. Pt expressed worry about being weaned totally off of O2. Returned to sitting EOB with call bell in reach.   [place] Mobility Specialist  Please Neurosurgeon or Rehab Office at [phone]

## 2022-09-28 NOTE — Progress Notes (Addendum)
Progress Note   Patient: MARCENE [name] ZOX:[id number] DOB: 08/11/1970 DOA: 09/16/2022     11 DOS: the patient was seen and examined on 09/28/2022   Brief hospital course: [name] was admitted to the hospital with the working diagnosis of heart failure exacerbation.  52 yo female with the past medical history of COPD, heart failure, T2DM, CKD, anxiety and hypothyroid who presented with dyspnea. Patient reported 2 days of progressive dyspnea, with wheezing, with no PND, orthopnea or lower extremity edema. Because persistent dyspnea she called EMS, she was found in distress, received IV methylprednisolone and bronchodilator therapy and transported to the ED. On her initial physical examination her blood pressure was 98/64, HR 105, RR 30 and 02 saturation 92%, heart with S1 and S2 present and rhythmic, respiratory with bilateral diffuse wheezing and rales, abdomen soft and trace lower extremity edema.   Na 139, K 4,2 Cl 98, bicarbonate 28, glucose 270, bun 20 cr 1,39 Mg 1,7 BNP 526 High sensitive troponin 49 and 53  Wbc 6,0 hgb 12,2 plt 195   EKG 104 bpm, left axis deviation, left anterior fascicular block, qtc 502, sinus rhythm, left atrial enlargement, with poor R R wave progression, no significant ST segment or T wave changes.  Positive PVC and positive LVH.   Chest radiograph with mild cardiomegaly, with bilateral hilar vascular congestions.   Patient was placed on bronchodilator therapy and received IV fluids for hypotension 500 cc LR. Then received furosemide 80 mg IV, with improvement in her symptoms.   04/09 acute worsening in dyspnea, increased work of breathing and tachycardia.  Repeated dose of furosemide 80 mg IV and placed on non invasive mechanical ventilation.  04/10 patient with worsening respiratory failure and was placed on invasive mechanical ventilation.   RIJ Central line was placed. Positive right pneumothorax, placed chest tube.  Positive for rhinovirus, treated  with steroids and bronchodilator therapy.  04/13 liberated from mechanical ventilation.  04/15 transferred to [facility].  04/19 patient has been clinically improving, she has not required non invasive mechanical ventilation over the last 4 days, while on the medical ward. Patient will not need non invasive mechanical ventilation at her discharge. 04/20 her oxygenation is improving.   Assessment and Plan: * Acute exacerbation of chronic obstructive pulmonary disease (COPD) Patient with improvement in respiratory failure, hypoxemic and hypercapnic.  Rhinovirus. (Ok to discontinue droplet precautions).   Continue with bronchodilator therpay.  Completed systemic corticosteroids. Continue inhaled corticosteroids.  Airway clearing techniques.   Patient has been on supplemental 02 and not requiring on invasive mechanical ventilation Positive ambulatory oxymetry desaturation yesterday, but is improving today.  Continue oxymetry monitoring, she may need home 02.   Right pneumothorax, chest tube has been removed.   Acute on chronic systolic CHF (congestive heart failure) Echocardiogram with reduced LV systolic function with EF 20 to 25%, LV cavity with moderate dilatation, no LVH, RV systolic function is preserved, no pericardial effusion, no significant valvular disease.   Continue medical therapy with metoprolol and isosorbide.  Continue with spironolactone and SGLT 2 inh. Diuresis with bumetanide.   Hold on ARB for now to prevent hypotension.  Now off telemetry.   Acute kidney injury superimposed on chronic kidney disease On spironolactone and empagliflozin  Continue diuresis with bumetanide.   Check renal function tomorrow.   Essential hypertension Resume low dose metoprolol and isosorbide.  If blood pressure tolerates will add ARB.   Type 2 diabetes mellitus with hyperlipidemia Uncontrolled with Hyperglycemia.   Continue glucose cover  and monitoring with insulin sliding  scale. Continue with basal insulin to 12 mg bid. Capillary glucose 1147 mg/dl.  Continue statin therapy.  Hyperthyroidism Continue with methimazole.   Bipolar 1 disorder Continue with risperidone.  Anxiety has improved with as needed lorazepam.   Pressure injury of skin Pressure Injury 09/17/22 Buttocks Left;Right Stage 1 -  Intact skin with non-blanchable redness of a localized area usually over a bony prominence. On both checks (Active)  09/17/22 2000  Location: Buttocks  Location Orientation: Left;Right  Staging: Stage 1 -  Intact skin with non-blanchable redness of a localized area usually over a bony prominence.  Wound Description (Comments): On both checks  Present on Admission: Yes  Dressing Type Foam - Lift dressing to assess site every shift 09/26/22 2100          Subjective: Patient with improvement in dyspnea and oxygenation, but not back yet to baseline   Physical Exam: Vitals:   09/28/22 0631 09/28/22 0800 09/28/22 0935 09/28/22 1100  BP: 115/67 (!) 89/49  106/67  Pulse:  80    Resp:  19    Temp:  98 F (36.7 C)    TempSrc:      SpO2:  100% 99% 98%  Weight:      Height:       Neurology awake and alert ENT with mild pallor Cardiovascular with S1 and S2 present and rhythmic with no gallops, rubs or murmurs Respiratory with prolonged expiratory phase with no wheezing or rhonchi Abdomen with no distention  Trace non pitting lower extremity edema  Data Reviewed:    Family Communication: no family at the bedside   Disposition: Status is: Inpatient Remains inpatient appropriate because: pending discharge planning, possible homeless shelter on Monday,   Planned Discharge Destination:  homeless shelter      Author: Coralie Keens, MD 09/28/2022 12:49 PM  For on call review [email or web address].

## 2022-09-29 DIAGNOSIS — E0590 Thyrotoxicosis, unspecified without thyrotoxic crisis or storm: Secondary | ICD-10-CM | POA: Diagnosis not present

## 2022-09-29 DIAGNOSIS — J441 Chronic obstructive pulmonary disease with (acute) exacerbation: Secondary | ICD-10-CM | POA: Diagnosis not present

## 2022-09-29 DIAGNOSIS — F319 Bipolar disorder, unspecified: Secondary | ICD-10-CM | POA: Diagnosis not present

## 2022-09-29 DIAGNOSIS — L89301 Pressure ulcer of unspecified buttock, stage 1: Secondary | ICD-10-CM

## 2022-09-29 DIAGNOSIS — I5023 Acute on chronic systolic (congestive) heart failure: Secondary | ICD-10-CM | POA: Diagnosis not present

## 2022-09-29 LAB — BASIC METABOLIC PANEL
Anion gap: 9 (ref 5–15)
BUN: 30 mg/dL — ABNORMAL HIGH (ref 6–20)
CO2: 25 mmol/L (ref 22–32)
Calcium: 8 mg/dL — ABNORMAL LOW (ref 8.9–10.3)
Chloride: 106 mmol/L (ref 98–111)
Creatinine, Ser: 0.91 mg/dL (ref 0.44–1.00)
GFR, Estimated: >60 mL/min (ref >60)
Glucose, Bld: 128 mg/dL — ABNORMAL HIGH (ref 70–99)
Potassium: 4.3 mmol/L (ref 3.5–5.1)
Sodium: 140 mmol/L (ref 135–145)

## 2022-09-29 LAB — GLUCOSE, CAPILLARY
Glucose-Capillary: 123 mg/dL — ABNORMAL HIGH (ref 70–99)
Glucose-Capillary: 162 mg/dL — ABNORMAL HIGH (ref 70–99)
Glucose-Capillary: 135 mg/dL — ABNORMAL HIGH (ref 70–99)
Glucose-Capillary: 110 mg/dL — ABNORMAL HIGH (ref 70–99)
Glucose-Capillary: 192 mg/dL — ABNORMAL HIGH (ref 70–99)

## 2022-09-29 MED ORDER — IPRATROPIUM-ALBUTEROL 0.5-2.5 (3) MG/3ML IN SOLN
3.0000 mL | Freq: Three times a day (TID) | RESPIRATORY_TRACT | Status: DC
  Administered 2022-09-29 – 2022-09-30 (×2): 3 mL via RESPIRATORY_TRACT
  Filled 2022-09-29 (×2): qty 3

## 2022-09-29 MED ORDER — DM-GUAIFENESIN ER 30-600 MG PO TB12
1.0000 | ORAL_TABLET | Freq: Two times a day (BID) | ORAL | Status: DC
  Administered 2022-09-29 – 2022-09-30 (×3): 1 via ORAL
  Filled 2022-09-29 (×3): qty 1

## 2022-09-29 NOTE — Plan of Care (Signed)
PT alert and oriented, eating and drinking well. Requested medication for chest pain at chest tube insertion site, PRN given with good results, although PT states she has Oxycodone  at home.  Also requested something for nausea after eating, states she ate to fast. Nausea resolved. Education provided to open curtains and turn on lights to differentiate day/night. PT allowed RN to open one curtain a small amount. PT independent to commode.  Problem: Education: Goal: Understanding of cardiac disease, CV risk reduction, and recovery process will improve Outcome: Progressing Goal: Individualized Educational Video(s) Outcome: Progressing   Problem: Activity: Goal: Ability to tolerate increased activity will improve Outcome: Progressing   Problem: Cardiac: Goal: Ability to achieve and maintain adequate cardiovascular perfusion will improve Outcome: Progressing   Problem: Health Behavior/Discharge Planning: Goal: Ability to safely manage health-related needs after discharge will improve Outcome: Progressing   Problem: Education: Goal: Ability to describe self-care measures that may prevent or decrease complications (Diabetes Survival Skills Education) will improve Outcome: Progressing Goal: Individualized Educational Video(s) Outcome: Progressing   Problem: Cardiac: Goal: Ability to maintain an adequate cardiac output will improve Outcome: Progressing   Problem: Health Behavior/Discharge Planning: Goal: Ability to identify and utilize available resources and services will improve Outcome: Progressing Goal: Ability to manage health-related needs will improve Outcome: Progressing   Problem: Fluid Volume: Goal: Ability to achieve a balanced intake and output will improve Outcome: Progressing   Problem: Metabolic: Goal: Ability to maintain appropriate glucose levels will improve Outcome: Progressing   Problem: Nutritional: Goal: Maintenance of adequate nutrition will  improve Outcome: Progressing Goal: Maintenance of adequate weight for body size and type will improve Outcome: Progressing   Problem: Respiratory: Goal: Will regain and/or maintain adequate ventilation Outcome: Progressing   Problem: Urinary Elimination: Goal: Ability to achieve and maintain adequate renal perfusion and functioning will improve Outcome: Progressing   Problem: Education: Goal: Ability to describe self-care measures that may prevent or decrease complications (Diabetes Survival Skills Education) will improve Outcome: Progressing Goal: Individualized Educational Video(s) Outcome: Progressing   Problem: Coping: Goal: Ability to adjust to condition or change in health will improve Outcome: Progressing   Problem: Fluid Volume: Goal: Ability to maintain a balanced intake and output will improve Outcome: Progressing   Problem: Health Behavior/Discharge Planning: Goal: Ability to identify and utilize available resources and services will improve Outcome: Progressing Goal: Ability to manage health-related needs will improve Outcome: Progressing   Problem: Metabolic: Goal: Ability to maintain appropriate glucose levels will improve Outcome: Progressing   Problem: Nutritional: Goal: Maintenance of adequate nutrition will improve Outcome: Progressing Goal: Progress toward achieving an optimal weight will improve Outcome: Progressing   Problem: Skin Integrity: Goal: Risk for impaired skin integrity will decrease Outcome: Progressing   Problem: Tissue Perfusion: Goal: Adequacy of tissue perfusion will improve Outcome: Progressing   Problem: Education: Goal: Knowledge of General Education information will improve Description: Including pain rating scale, medication(s)/side effects and non-pharmacologic comfort measures Outcome: Progressing   Problem: Health Behavior/Discharge Planning: Goal: Ability to manage health-related needs will improve Outcome:  Progressing   Problem: Clinical Measurements: Goal: Ability to maintain clinical measurements within normal limits will improve Outcome: Progressing Goal: Will remain free from infection Outcome: Progressing Goal: Diagnostic test results will improve Outcome: Progressing Goal: Respiratory complications will improve Outcome: Progressing Goal: Cardiovascular complication will be avoided Outcome: Progressing   Problem: Activity: Goal: Risk for activity intolerance will decrease Outcome: Progressing   Problem: Coping: Goal: Level of anxiety will decrease Outcome: Progressing   Problem:  Elimination: Goal: Will not experience complications related to bowel motility Outcome: Progressing Goal: Will not experience complications related to urinary retention Outcome: Progressing   Problem: Pain Managment: Goal: General experience of comfort will improve Outcome: Progressing   Problem: Activity: Goal: Ability to tolerate increased activity will improve Outcome: Progressing   Problem: Respiratory: Goal: Ability to maintain a clear airway and adequate ventilation will improve Outcome: Progressing   Problem: Role Relationship: Goal: Method of communication will improve Outcome: Progressing   Problem: Activity: Goal: Ability to tolerate increased activity will improve Outcome: Progressing   Problem: Respiratory: Goal: Ability to maintain a clear airway and adequate ventilation will improve Outcome: Progressing

## 2022-09-29 NOTE — Plan of Care (Signed)
Problem: Education: Goal: Understanding of cardiac disease, CV risk reduction, and recovery process will improve 09/29/2022 1544 by Jeraldine Loots, RN Outcome: Progressing 09/29/2022 1251 by Jeraldine Loots, RN Outcome: Progressing Goal: Individualized Educational Video(s) 09/29/2022 1544 by Jeraldine Loots, RN Outcome: Progressing 09/29/2022 1251 by Jeraldine Loots, RN Outcome: Progressing   Problem: Activity: Goal: Ability to tolerate increased activity will improve 09/29/2022 1544 by Jeraldine Loots, RN Outcome: Progressing 09/29/2022 1251 by Jeraldine Loots, RN Outcome: Progressing   Problem: Cardiac: Goal: Ability to achieve and maintain adequate cardiovascular perfusion will improve 09/29/2022 1544 by Jeraldine Loots, RN Outcome: Progressing 09/29/2022 1251 by Jeraldine Loots, RN Outcome: Progressing   Problem: Health Behavior/Discharge Planning: Goal: Ability to safely manage health-related needs after discharge will improve 09/29/2022 1544 by Jeraldine Loots, RN Outcome: Progressing 09/29/2022 1251 by Jeraldine Loots, RN Outcome: Progressing   Problem: Education: Goal: Ability to describe self-care measures that may prevent or decrease complications (Diabetes Survival Skills Education) will improve 09/29/2022 1544 by Jeraldine Loots, RN Outcome: Progressing 09/29/2022 1251 by Jeraldine Loots, RN Outcome: Progressing Goal: Individualized Educational Video(s) 09/29/2022 1544 by Jeraldine Loots, RN Outcome: Progressing 09/29/2022 1251 by Jeraldine Loots, RN Outcome: Progressing   Problem: Cardiac: Goal: Ability to maintain an adequate cardiac output will improve 09/29/2022 1544 by Jeraldine Loots, RN Outcome: Progressing 09/29/2022 1251 by Jeraldine Loots, RN Outcome: Progressing   Problem: Health Behavior/Discharge Planning: Goal: Ability to identify and utilize available resources and services will improve 09/29/2022 1544 by Jeraldine Loots, RN Outcome: Progressing 09/29/2022 1251 by Jeraldine Loots,  RN Outcome: Progressing Goal: Ability to manage health-related needs will improve 09/29/2022 1544 by Jeraldine Loots, RN Outcome: Progressing 09/29/2022 1251 by Jeraldine Loots, RN Outcome: Progressing   Problem: Fluid Volume: Goal: Ability to achieve a balanced intake and output will improve 09/29/2022 1544 by Jeraldine Loots, RN Outcome: Progressing 09/29/2022 1251 by Jeraldine Loots, RN Outcome: Progressing   Problem: Metabolic: Goal: Ability to maintain appropriate glucose levels will improve 09/29/2022 1544 by Jeraldine Loots, RN Outcome: Progressing 09/29/2022 1251 by Jeraldine Loots, RN Outcome: Progressing   Problem: Nutritional: Goal: Maintenance of adequate nutrition will improve 09/29/2022 1544 by Jeraldine Loots, RN Outcome: Progressing 09/29/2022 1251 by Jeraldine Loots, RN Outcome: Progressing Goal: Maintenance of adequate weight for body size and type will improve 09/29/2022 1544 by Jeraldine Loots, RN Outcome: Progressing 09/29/2022 1251 by Jeraldine Loots, RN Outcome: Progressing   Problem: Respiratory: Goal: Will regain and/or maintain adequate ventilation 09/29/2022 1544 by Jeraldine Loots, RN Outcome: Progressing 09/29/2022 1251 by Jeraldine Loots, RN Outcome: Progressing   Problem: Urinary Elimination: Goal: Ability to achieve and maintain adequate renal perfusion and functioning will improve 09/29/2022 1544 by Jeraldine Loots, RN Outcome: Progressing 09/29/2022 1251 by Jeraldine Loots, RN Outcome: Progressing   Problem: Education: Goal: Ability to describe self-care measures that may prevent or decrease complications (Diabetes Survival Skills Education) will improve 09/29/2022 1544 by Jeraldine Loots, RN Outcome: Progressing 09/29/2022 1251 by Jeraldine Loots, RN Outcome: Progressing Goal: Individualized Educational Video(s) 09/29/2022 1544 by Jeraldine Loots, RN Outcome: Progressing 09/29/2022 1251 by Jeraldine Loots, RN Outcome: Progressing   Problem: Coping: Goal: Ability to adjust to  condition or change in health will improve 09/29/2022 1544 by Jeraldine Loots, RN Outcome: Progressing 09/29/2022 1251 by Jeraldine Loots, RN Outcome: Progressing   Problem: Fluid  Volume: Goal: Ability to maintain a balanced intake and output will improve 09/29/2022 1544 by Jeraldine Loots, RN Outcome: Progressing 09/29/2022 1251 by Jeraldine Loots, RN Outcome: Progressing   Problem: Health Behavior/Discharge Planning: Goal: Ability to identify and utilize available resources and services will improve 09/29/2022 1544 by Jeraldine Loots, RN Outcome: Progressing 09/29/2022 1251 by Jeraldine Loots, RN Outcome: Progressing Goal: Ability to manage health-related needs will improve 09/29/2022 1544 by Jeraldine Loots, RN Outcome: Progressing 09/29/2022 1251 by Jeraldine Loots, RN Outcome: Progressing   Problem: Metabolic: Goal: Ability to maintain appropriate glucose levels will improve 09/29/2022 1544 by Jeraldine Loots, RN Outcome: Progressing 09/29/2022 1251 by Jeraldine Loots, RN Outcome: Progressing   Problem: Nutritional: Goal: Maintenance of adequate nutrition will improve 09/29/2022 1544 by Jeraldine Loots, RN Outcome: Progressing 09/29/2022 1251 by Jeraldine Loots, RN Outcome: Progressing Goal: Progress toward achieving an optimal weight will improve 09/29/2022 1544 by Jeraldine Loots, RN Outcome: Progressing 09/29/2022 1251 by Jeraldine Loots, RN Outcome: Progressing   Problem: Skin Integrity: Goal: Risk for impaired skin integrity will decrease 09/29/2022 1544 by Jeraldine Loots, RN Outcome: Progressing 09/29/2022 1251 by Jeraldine Loots, RN Outcome: Progressing   Problem: Tissue Perfusion: Goal: Adequacy of tissue perfusion will improve 09/29/2022 1544 by Jeraldine Loots, RN Outcome: Progressing 09/29/2022 1251 by Jeraldine Loots, RN Outcome: Progressing   Problem: Education: Goal: Knowledge of General Education information will improve Description: Including pain rating scale, medication(s)/side effects  and non-pharmacologic comfort measures 09/29/2022 1544 by Jeraldine Loots, RN Outcome: Progressing 09/29/2022 1251 by Jeraldine Loots, RN Outcome: Progressing   Problem: Health Behavior/Discharge Planning: Goal: Ability to manage health-related needs will improve 09/29/2022 1544 by Jeraldine Loots, RN Outcome: Progressing 09/29/2022 1251 by Jeraldine Loots, RN Outcome: Progressing   Problem: Clinical Measurements: Goal: Ability to maintain clinical measurements within normal limits will improve 09/29/2022 1544 by Jeraldine Loots, RN Outcome: Progressing 09/29/2022 1251 by Jeraldine Loots, RN Outcome: Progressing Goal: Will remain free from infection 09/29/2022 1544 by Jeraldine Loots, RN Outcome: Progressing 09/29/2022 1251 by Jeraldine Loots, RN Outcome: Progressing Goal: Diagnostic test results will improve 09/29/2022 1544 by Jeraldine Loots, RN Outcome: Progressing 09/29/2022 1251 by Jeraldine Loots, RN Outcome: Progressing Goal: Respiratory complications will improve 09/29/2022 1544 by Jeraldine Loots, RN Outcome: Progressing 09/29/2022 1251 by Jeraldine Loots, RN Outcome: Progressing Goal: Cardiovascular complication will be avoided 09/29/2022 1544 by Jeraldine Loots, RN Outcome: Progressing 09/29/2022 1251 by Jeraldine Loots, RN Outcome: Progressing   Problem: Activity: Goal: Risk for activity intolerance will decrease 09/29/2022 1544 by Jeraldine Loots, RN Outcome: Progressing 09/29/2022 1251 by Jeraldine Loots, RN Outcome: Progressing   Problem: Coping: Goal: Level of anxiety will decrease 09/29/2022 1544 by Jeraldine Loots, RN Outcome: Progressing 09/29/2022 1251 by Jeraldine Loots, RN Outcome: Progressing   Problem: Elimination: Goal: Will not experience complications related to bowel motility 09/29/2022 1544 by Jeraldine Loots, RN Outcome: Progressing 09/29/2022 1251 by Jeraldine Loots, RN Outcome: Progressing Goal: Will not experience complications related to urinary retention 09/29/2022 1544 by Jeraldine Loots, RN Outcome: Progressing 09/29/2022 1251 by Jeraldine Loots, RN Outcome: Progressing   Problem: Pain Managment: Goal: General experience of comfort will improve 09/29/2022 1544 by Jeraldine Loots, RN Outcome: Progressing 09/29/2022 1251 by Jeraldine Loots, RN Outcome: Progressing   Problem: Activity: Goal: Ability to tolerate increased activity will  improve 09/29/2022 1544 by Jeraldine Loots, RN Outcome: Progressing 09/29/2022 1251 by Jeraldine Loots, RN Outcome: Progressing   Problem: Respiratory: Goal: Ability to maintain a clear airway and adequate ventilation will improve 09/29/2022 1544 by Jeraldine Loots, RN Outcome: Progressing 09/29/2022 1251 by Jeraldine Loots, RN Outcome: Progressing   Problem: Role Relationship: Goal: Method of communication will improve 09/29/2022 1544 by Jeraldine Loots, RN Outcome: Progressing 09/29/2022 1251 by Jeraldine Loots, RN Outcome: Progressing   Problem: Activity: Goal: Ability to tolerate increased activity will improve 09/29/2022 1544 by Jeraldine Loots, RN Outcome: Progressing 09/29/2022 1251 by Jeraldine Loots, RN Outcome: Progressing   Problem: Respiratory: Goal: Ability to maintain a clear airway and adequate ventilation will improve 09/29/2022 1544 by Jeraldine Loots, RN Outcome: Progressing 09/29/2022 1251 by Jeraldine Loots, RN Outcome: Progressing

## 2022-09-29 NOTE — Progress Notes (Signed)
Progress Note   Patient: [name] [name]:[id number] DOB: 1970-08-14 DOA: 09/16/2022     12 DOS: the patient was seen and examined on 09/29/2022   Brief hospital course: [name] was admitted to the hospital with the working diagnosis of heart failure exacerbation.  52 yo female with the past medical history of COPD, heart failure, T2DM, CKD, anxiety and hypothyroid who presented with dyspnea. Patient reported 2 days of progressive dyspnea, with wheezing, with no PND, orthopnea or lower extremity edema. Because persistent dyspnea she called EMS, she was found in distress, received IV methylprednisolone and bronchodilator therapy and transported to the ED. On her initial physical examination her blood pressure was 98/64, HR 105, RR 30 and 02 saturation 92%, heart with S1 and S2 present and rhythmic, respiratory with bilateral diffuse wheezing and rales, abdomen soft and trace lower extremity edema.   Na 139, K 4,2 Cl 98, bicarbonate 28, glucose 270, bun 20 cr 1,39 Mg 1,7 BNP 526 High sensitive troponin 49 and 53  Wbc 6,0 hgb 12,2 plt 195   EKG 104 bpm, left axis deviation, left anterior fascicular block, qtc 502, sinus rhythm, left atrial enlargement, with poor R R wave progression, no significant ST segment or T wave changes.  Positive PVC and positive LVH.   Chest radiograph with mild cardiomegaly, with bilateral hilar vascular congestions.   Patient was placed on bronchodilator therapy and received IV fluids for hypotension 500 cc LR. Then received furosemide 80 mg IV, with improvement in her symptoms.   04/09 acute worsening in dyspnea, increased work of breathing and tachycardia.  Repeated dose of furosemide 80 mg IV and placed on non invasive mechanical ventilation.  04/10 patient with worsening respiratory failure and was placed on invasive mechanical ventilation.   [facility] line was placed. Positive right pneumothorax, placed chest tube.  Positive for rhinovirus, treated  with steroids and bronchodilator therapy.  04/13 liberated from mechanical ventilation.  04/15 transferred to [facility].  04/19 patient has been clinically improving, she has not required non invasive mechanical ventilation over the last 4 days, while on the medical ward. Patient will not need non invasive mechanical ventilation at her discharge. 04/20 her oxygenation is improving.   09/29/22: Overall improving; still reporting shortness of breath and choking sensation with activity; pleuritic chest pain also described.  No fever, no nausea, no vomiting.  Patient is stabilizing and probably Ready for discharge in the next 24-48 hours.  Assessment and Plan: * Acute exacerbation of chronic obstructive pulmonary disease (COPD) -Patient with improvement in respiratory failure, hypoxemic and hypercapnic.  Rhinovirus.  -remains afebrile -still with intermittent coughing spells (short winded sensation with activity. -Continue Dulera, as needed bronchodilator management and the use of Mucinex. -Incentive spirometry/flutter valve has been added to her regimen -Continue to follow clinical response.  Right pneumothorax, chest tube has been removed.  -Continue as needed analgesics.  Acute on chronic systolic CHF (congestive heart failure) -Echocardiogram with reduced LV systolic function with EF 20 to 25%, LV cavity with moderate dilatation, no LVH, RV systolic function is preserved, no pericardial effusion, no significant valvular disease.  -Continue medical therapy with metoprolol and isosorbide and the use of spironolactone and SGLT 2 inhibitors. -continue diuresis with Bumex -low sodium diet and daily weight discussed with patient -Continue to follow daily weights, strict I's and O's and low-sodium diet.. Diuresis with bumetanide.   Acute kidney injury superimposed on chronic kidney disease -On spironolactone and empagliflozin  -Continue diuresis with bumetanide.  -Renal function  stable -Continue  to follow trend intermittently. -Patient advised to maintain adequate hydration.  Essential hypertension -Stable overall -Continue current antihypertensive regimen -Continue to follow vital signs response. -Low-sodium diet discussed with patient.  Type 2 diabetes mellitus with hyperlipidemia -Uncontrolled with Hyperglycemia.  -Continue sliding scale insulin and the use of Semglee 24 units twice a day -Modified carbohydrate diet discussed with patient.    Hyperthyroidism -Continue treatment with methimazole.   Bipolar 1 disorder -Continue with risperidone and as needed lorazepam.  Pressure injury of skin Pressure Injury 09/17/22 Buttocks Left;Right Stage 1 -  Intact skin with non-blanchable redness of a localized area usually over a bony prominence. On both checks (Active)  09/17/22 2000  Location: Buttocks  Location Orientation: Left;Right  Staging: Stage 1 -  Intact skin with non-blanchable redness of a localized area usually over a bony prominence.  Wound Description (Comments): On both checks  Present on Admission: Yes  Dressing Type Foam - Lift dressing to assess site every shift 09/26/22 2100     Subjective: Complaining of shortness of breath with activity; afebrile, no nausea, no vomiting.  Reporting pleuritic chest pain in the area where the chest tube was placed.  Physical Exam: Vitals:   09/28/22 2350 09/29/22 0414 09/29/22 0418 09/29/22 1128  BP: 102/61 98/62  100/64  Pulse: 85 84  88  Resp: Temp: 98.5 F (36.9 C) 98.4 F (36.9 C)    TempSrc: Oral Oral    SpO2: 97% 93%    Weight:   85 kg   Height:       General exam: Alert, awake, oriented x 3; reporting feeling short of breath with activity; and having pleuritic chest pain.  No fever, no nausea, no vomiting. Respiratory system: decreased breath sounds at the bases; no using accessory muscle.  2 L nasal Supplementation in place.  Scattered end expiratory wheezing appreciated. Cardiovascular  system: Rate controlled, no rubs, no gallops, no JVD. Gastrointestinal system: Abdomen is nondistended, soft and nontender. No organomegaly or masses felt. Normal bowel sounds heard. Central nervous system: Alert and oriented. No focal neurological deficits. Extremities: No cyanosis or clubbing.  Trace edema appreciated bilaterally. Skin: No petechiae.  Stage I pressure injury left buttocks; preventative dressing in place.  No signs of superimposed infection as per nursing reports. Psychiatry: Judgement and insight appear normal. Mood & affect appropriate.   Data Reviewed: Basic metabolic panel: Sodium 140, potassium 4.3, chloride 106, bicarb 25, BUN 30, creatinine 0.91 and GFR more than 60.   Family Communication: no family at the bedside   Disposition: Status is: Inpatient Remains inpatient appropriate because: pending discharge planning, possible homeless shelter on Monday.   Planned Discharge Destination:  homeless shelter   Author: Vassie Loll, MD 09/29/2022 2:46 PM  For on call review [email or web address].

## 2022-09-29 NOTE — Progress Notes (Addendum)
PT alert and oriented, no s/s distress. Able to make needs known. Education provided to open window shared during the day to prevent hospital delirium, PT allowed one shade to be opened a small amount.  Reports chest pain at chest tuber insertion site.PRN Oxycodone given with good results. PT reported at home she has a prescription for Oxycodone .PT has orders for Oxcodone  at this time. Sleeps after administration. Non-verbal pain assessment is zero.   Dr. Gwenlyn Perking notified PT has loud expiratory wheeze/rhonchi especially in right upper lobe. New orders for nebulizer and flutter valve.

## 2022-09-30 ENCOUNTER — Other Ambulatory Visit (HOSPITAL_COMMUNITY): Payer: Self-pay

## 2022-09-30 ENCOUNTER — Ambulatory Visit (INDEPENDENT_AMBULATORY_CARE_PROVIDER_SITE_OTHER): Payer: Medicaid Other | Admitting: Primary Care

## 2022-09-30 DIAGNOSIS — I10 Essential (primary) hypertension: Secondary | ICD-10-CM | POA: Diagnosis not present

## 2022-09-30 DIAGNOSIS — J441 Chronic obstructive pulmonary disease with (acute) exacerbation: Secondary | ICD-10-CM | POA: Diagnosis not present

## 2022-09-30 DIAGNOSIS — N179 Acute kidney failure, unspecified: Secondary | ICD-10-CM | POA: Diagnosis not present

## 2022-09-30 DIAGNOSIS — I5023 Acute on chronic systolic (congestive) heart failure: Secondary | ICD-10-CM | POA: Diagnosis not present

## 2022-09-30 LAB — BASIC METABOLIC PANEL
Anion gap: 10 (ref 5–15)
BUN: 25 mg/dL — ABNORMAL HIGH (ref 6–20)
CO2: 26 mmol/L (ref 22–32)
Calcium: 8.1 mg/dL — ABNORMAL LOW (ref 8.9–10.3)
Chloride: 103 mmol/L (ref 98–111)
Creatinine, Ser: 0.86 mg/dL (ref 0.44–1.00)
GFR, Estimated: >60 mL/min (ref >60)
Glucose, Bld: 128 mg/dL — ABNORMAL HIGH (ref 70–99)
Potassium: 4.3 mmol/L (ref 3.5–5.1)
Sodium: 139 mmol/L (ref 135–145)

## 2022-09-30 LAB — GLUCOSE, CAPILLARY
Glucose-Capillary: 84 mg/dL (ref 70–99)
Glucose-Capillary: 139 mg/dL — ABNORMAL HIGH (ref 70–99)

## 2022-09-30 MED ORDER — INSULIN PEN NEEDLE 32G X 4 MM MISC
0 refills | Status: DC
  Filled 2022-09-30: qty 100, 33d supply, fill #0
  Filled 2023-05-27: qty 100, 90d supply, fill #0

## 2022-09-30 MED ORDER — POTASSIUM CHLORIDE CRYS ER 10 MEQ PO TBCR
10.0000 meq | EXTENDED_RELEASE_TABLET | Freq: Every day | ORAL | 0 refills | Status: DC
  Filled 2022-09-30: qty 30, 30d supply, fill #0

## 2022-09-30 MED ORDER — IPRATROPIUM-ALBUTEROL 0.5-2.5 (3) MG/3ML IN SOLN
3.0000 mL | Freq: Four times a day (QID) | RESPIRATORY_TRACT | 0 refills | Status: DC | PRN
  Filled 2022-09-30: qty 270, 23d supply, fill #0

## 2022-09-30 NOTE — Progress Notes (Signed)
Pt just got a call, her ride is here. Volunteer will come and get pt.

## 2022-09-30 NOTE — TOC Transition Note (Signed)
Discharge medications (16) have been retrieved from [facility] weekend lockup and are now being stored in the Transitions of Care [facility]acy on the second floor until patient is ready for discharge.

## 2022-09-30 NOTE — Discharge Summary (Signed)
Physician Discharge Summary   Patient: [name] [name] MRN: [id number] DOB: [birth year]  Admit date:     09/16/2022  Discharge date: 09/30/22  Discharge Physician: York Ram Dhani Imel   PCP: Patient, No Pcp Per   Recommendations at discharge:   Patient had refill for her medications.  She does not require non invasive mechanical ventilation any more.  Patient needs close follow up as outpatient.  She will be going with her son.   Discharge Diagnoses: Principal Problem:   Acute exacerbation of chronic obstructive pulmonary disease (COPD) Active Problems:   Acute on chronic systolic CHF (congestive heart failure)   Acute kidney injury superimposed on chronic kidney disease   Essential hypertension   Type 2 diabetes mellitus with hyperlipidemia   Hyperthyroidism   Bipolar 1 disorder   Pressure injury of skin  Resolved Problems:   * No resolved hospital problems. [facility] Course: [name] [name] was admitted to the hospital with the working diagnosis of heart failure exacerbation/ COPD exacerbation.   52 yo female with the past medical history of COPD, heart failure, T2DM, CKD, anxiety and hypothyroid who presented with dyspnea. Patient reported 2 days of progressive dyspnea, with wheezing, with no PND, orthopnea or lower extremity edema. Because persistent dyspnea she called EMS, she was found in distress, received IV methylprednisolone and bronchodilator therapy and transported to the ED. On her initial physical examination her blood pressure was 98/64, HR 105, RR 30 and 02 saturation 92%, heart with S1 and S2 present and rhythmic, respiratory with bilateral diffuse wheezing and rales, abdomen soft and trace lower extremity edema.   Na 139, K 4,2 Cl 98, bicarbonate 28, glucose 270, bun 20 cr 1,39 Mg 1,7 BNP 526 High sensitive troponin 49 and 53  Wbc 6,0 hgb 12,2 plt 195   EKG 104 bpm, left axis deviation, left anterior fascicular block, qtc 502, sinus rhythm, left atrial  enlargement, with poor R R wave progression, no significant ST segment or T wave changes.  Positive PVC and positive LVH.   Chest radiograph with mild cardiomegaly, with bilateral hilar vascular congestions.   Patient was placed on bronchodilator therapy and received IV fluids for hypotension 500 cc LR. Then received furosemide 80 mg IV, with improvement in her symptoms.   04/09 acute worsening in dyspnea, increased work of breathing and tachycardia.  Repeated dose of furosemide 80 mg IV and placed on non invasive mechanical ventilation.  04/10 patient with worsening respiratory failure and was placed on invasive mechanical ventilation.   RIJ Central line was placed. Positive right pneumothorax, placed chest tube.  Positive for rhinovirus, treated with steroids and bronchodilator therapy.  04/13 liberated from mechanical ventilation.  04/15 transferred to [facility].  04/19 patient has been clinically improving, she has not required non invasive mechanical ventilation over the last 4 days, while on the medical ward. Patient will not need non invasive mechanical ventilation at her discharge. 04/20 her oxygenation is improving.   09/29/22: Overall improving; still reporting shortness of breath and choking sensation with activity; pleuritic chest pain also described.  No fever, no nausea, no vomiting.  Patient is stabilizing. 09/30/22 patient clinically stable for discharge home.   Assessment and Plan: * Acute exacerbation of chronic obstructive pulmonary disease (COPD) -Patient with improvement in respiratory failure, hypoxemic and hypercapnic.  Rhinovirus.   -Continue Dulera, as needed bronchodilator management.  -Incentive spirometry/flutter valve has been added to her regimen Home 02 supplementation per [place].   Right pneumothorax,  Required treatment chest tube,  which was successfully removed.  Pain control with acetaminophen.   Acute on chronic systolic CHF (congestive heart  failure) -Echocardiogram with reduced LV systolic function with EF 20 to 25%, LV cavity with moderate dilatation, no LVH, RV systolic function is preserved, no pericardial effusion, no significant valvular disease.   Patient received diuresis with IV furosemide, at the time of her discharge she is 5,763 ml, with significant improvement in her symptoms.   Continue medical therapy with metoprolol and isosorbide and the use of spironolactone and SGLT 2 inhibitors. -continue diuresis with Bumex -low sodium diet and daily weight discussed with patient  Acute kidney injury superimposed on chronic kidney disease Patient tolerated well diuresis, at the time of her discharge her serum cr id 0,86 with K at 4,3 and serum bicarbonate at 26, Na 136.   Continue with spironolactone and empagliflozin  -Continue diuresis with bumetanide.  Follow up renal function as outpatient.   Essential hypertension Systolic blood pressure at 100's Continue diuresis with bumetanide, SGLT 2 inh and spironolactone.  Continue metoprolol and isosorbide.    Type 2 diabetes mellitus with hyperlipidemia -Uncontrolled with Hyperglycemia.  Patient was placed on basal and sliding scale insulin with good toleration.  Continue statin therapy.    Hyperthyroidism -Continue treatment with methimazole.   Bipolar 1 disorder -Continue with risperidone. She did required intermittent as needed lorazepam during her hospitalization.   Pressure injury of skin Pressure Injury 09/17/22 Buttocks Left;Right Stage 1 -  Intact skin with non-blanchable redness of a localized area usually over a bony prominence. On both checks (Active)  09/17/22 2000  Location: Buttocks  Location Orientation: Left;Right  Staging: Stage 1 -  Intact skin with non-blanchable redness of a localized area usually over a bony prominence.  Wound Description (Comments): On both checks  Present on Admission: Yes  Dressing Type Foam - Lift dressing to assess  site every shift 09/26/22 2100           Consultants: pulmonary critical care  Procedures performed: right central line, right chest tube   Disposition: Home Diet recommendation:  Cardiac diet DISCHARGE MEDICATION: Allergies as of 09/30/2022       Reactions   Tramadol Nausea Only, Rash   Benadryl [diphenhydramine]    Decreases motor skills and increases anxiety   Lactose Intolerance (gi)    Wellbutrin [bupropion] Other (See Comments)   rage   Nicotine Rash   Patient states she is allergic to the Nicotine patch and they cause her to break out in a rash.  She states she can smoke cigarettes.        Medication List     TAKE these medications    acetaminophen 325 MG tablet Commonly known as: TYLENOL Take 1 tablet (325 mg total) by mouth every 6 (six) hours as needed for headache or mild pain.   Aspirin Low Dose 81 MG tablet Generic drug: aspirin EC Take 1 tablet (81 mg total) by mouth daily.   blood glucose meter kit and supplies Dispense based on patient and insurance preference. Use up to four times daily as directed. (FOR ICD-10 E10.9, E11.9).   bumetanide 2 MG tablet Commonly known as: BUMEX Take 1 tablet (2 mg total) by mouth 2 (two) times daily.   Dulera 200-5 MCG/ACT Aero Generic drug: mometasone-formoterol Inhale 2 puffs into the lungs 2 (two) times daily.   Insulin Pen Needle 32G X 4 MM Misc Use As Directed What changed:  how much to take how to take this when to  take this   ipratropium-albuterol 0.5-2.5 (3) MG/3ML Soln Commonly known as: DUONEB Take 3 mLs by nebulization every 6 (six) hours as needed.   isosorbide mononitrate 30 MG 24 hr tablet Commonly known as: IMDUR Take 1 tablet (30 mg total) by mouth daily. What changed:  medication strength how much to take   Jardiance 10 MG Tabs tablet Generic drug: empagliflozin Take 1 tablet (10 mg total) by mouth daily.   methimazole 5 MG tablet Commonly known as: TAPAZOLE Take 1 tablet (5  mg total) by mouth daily for 21 days.   metoprolol succinate 25 MG 24 hr tablet Commonly known as: TOPROL-XL Take 0.5 tablets (12.5 mg total) by mouth daily. What changed: how much to take   nitroGLYCERIN 0.4 MG SL tablet Commonly known as: NITROSTAT Place 1 tablet (0.4 mg total) under the tongue every 5 (five) minutes as needed for chest pain.   oxyCODONE-acetaminophen 10-325 MG tablet Commonly known as: PERCOCET Take 1 tablet by mouth every 6 (six) hours as needed for pain.   potassium chloride 10 MEQ tablet Commonly known as: KLOR-CON M Take 1 tablet (10 mEq total) by mouth daily. What changed:  medication strength how much to take additional instructions   risperiDONE 2 MG tablet Commonly known as: RISPERDAL Take one-half tablet in the morning and one tablet in the evening.   rosuvastatin 20 MG tablet Commonly known as: CRESTOR Take 1 tablet (20 mg total) by mouth daily.   spironolactone 25 MG tablet Commonly known as: ALDACTONE Take 0.5 tablets (12.5 mg total) by mouth daily.   Toujeo SoloStar 300 UNIT/ML Solostar Pen Generic drug: insulin glargine (1 Unit Dial) Inject 24 Units into the skin at bedtime.   Ventolin HFA 108 (90 Base) MCG/ACT inhaler Generic drug: albuterol Inhale 2 puffs into the lungs every 6 (six) hours as needed for wheezing or shortness of breath.               Durable Medical Equipment  (From admission, onward)           Start     Ordered   09/27/22 1107  For home use only DME oxygen  Once       Question Answer Comment  Length of Need 12 Months   Mode or (Route) Nasal cannula   Liters per Minute 5   Frequency Continuous (stationary and portable oxygen unit needed)   Oxygen conserving device Yes   Oxygen delivery system Gas      09/27/22 1106            Follow-up Information     [facility] Follow up.   Why: GO: Monday April 22 at 2:30pm (Mrs. Randa Evens) Contact information: [address] [place] [address] [phone]               Discharge Exam: Filed Weights   09/28/22 0429 09/29/22 0418 09/30/22 0454  Weight: 83.5 kg 85 kg 84.1 kg   BP (!) 94/40 (BP Location: Right Wrist)   Pulse 84   Temp 98.5 F (36.9 C)   Resp 20   Ht  (1.651 m)   Wt 84.1 kg   SpO2 97%   BMI 30.84 kg/m  Blood pressure trend 101/56, 109/68   Patient is feeling better, dyspnea has improved.  Neurology awake and alert ENT with no pallor Cardiovascular with S1 and S2 present and rhythmic, with no gallops, rubs or murmurs No JVD No lower extremity edema  Respiratory with no rales or wheezing, no rhonchi Abdomen with no distention   Condition at discharge: stable  The results of significant diagnostics from this hospitalization (including imaging, microbiology, ancillary and laboratory) are listed below for reference.   Imaging Studies: DG CHEST PORT 1 VIEW  Result Date: 09/24/2022 CLINICAL DATA:  Pneumothorax. EXAM: PORTABLE CHEST 1 VIEW COMPARISON:  09/23/2022. FINDINGS: 0457 hours. Unchanged right-sided pleural drainage catheter. No definite residual pneumothorax. Slightly decreased subcutaneous emphysema along the right chest wall. Improved aeration of the right lung base. No consolidation. Stable mild cardiomegaly and mediastinal contours. IMPRESSION: Unchanged right-sided pleural drainage catheter with no definite residual pneumothorax. Electronically Signed   By: Orvan Falconer M.D.   On: 09/24/2022 08:16   DG CHEST PORT 1 VIEW  Result Date: 09/23/2022 CLINICAL DATA:  Pneumothorax EXAM: PORTABLE CHEST 1 VIEW COMPARISON:  X-ray 09/23/2022 FINDINGS: Enlarged cardiopericardial silhouette. No edema. Right basilar atelectasis. No pneumothorax or significant effusion. There is a pigtail catheter again seen in the right mid thorax. Persistent right-sided chest wall gas. IMPRESSION: Pigtail catheter in place. No clear right apical pneumothorax  today. Persistent chest wall gas. Enlarged heart with basilar atelectasis. Electronically Signed   By: Karen Kays M.D.   On: 09/23/2022 16:54   DG CHEST PORT 1 VIEW  Result Date: 09/23/2022 CLINICAL DATA:  Follow up pneumothorax EXAM: PORTABLE CHEST 1 VIEW COMPARISON:  09/22/2022 FINDINGS: Tiny right apical pneumothorax. Right-sided chest tube in place. Enlarged cardiac silhouette. No pleural effusion identified. IMPRESSION: Tiny right apical pneumothorax persists. Electronically Signed   By: Layla Maw M.D.   On: 09/23/2022 08:29   DG CHEST PORT 1 VIEW  Addendum Date: 09/22/2022   ADDENDUM REPORT: 09/22/2022 12:33 ADDENDUM: Addendum to state that there is a small right apical pneumothorax with a right pleural pigtail catheter in place. Electronically Signed   By: Jacob Moores M.D.   On: 09/22/2022 12:33   Result Date: 09/22/2022 CLINICAL DATA:  Hypoxia EXAM: PORTABLE CHEST 1 VIEW COMPARISON:  Chest x-ray September 19, 2022 FINDINGS: Interval extubation and removal of the NG tube. Stable positioning of the right approach central venous catheter and right pleural pigtail catheter. The cardiomediastinal silhouette is unchanged in contour. Right basilar pulmonary opacity, likely atelectasis. No pleural effusion or pneumothorax. The visualized upper abdomen is unremarkable. No acute osseous abnormality. IMPRESSION: Right basilar pulmonary opacity, likely atelectasis. Additional differential considerations include aspiration or infection in the appropriate clinical context. Electronically Signed: By: Jacob Moores M.D. On: 09/22/2022 12:22   DG CHEST PORT 1 VIEW  Result Date: 09/19/2022 CLINICAL DATA:  Pneumothorax EXAM: PORTABLE CHEST 1 VIEW COMPARISON:  Previous studies including the examination of 09/18/2022 FINDINGS: Transverse diameter of heart is increased. There are no signs of alveolar pulmonary edema. Linear densities are seen in medial lower lung fields with interval improvement. There  are no new focal infiltrates. There is no pleural effusion or pneumothorax. These hip pigtail right chest tube with its tip in the lateral aspect of right upper lung field. Tip of right IJ central venous catheter is seen in superior vena cava. Tip of endotracheal tube is 4.9 cm above the carina. NG tube is noted traversing the esophagus. Subcutaneous emphysema is seen in right axilla. IMPRESSION: Cardiomegaly. There are no signs of pulmonary edema. Subsegmental atelectasis is seen in lower lung fields with some interval improvement. There are no new focal infiltrates. Electronically Signed   By: Ernie Avena M.D.   On: 09/19/2022 09:34   DG CHEST PORT 1  VIEW  Result Date: 09/18/2022 CLINICAL DATA:  09/18/2022 EXAM: PORTABLE CHEST 1 VIEW COMPARISON:  09/18/2022 FINDINGS: Interim placement of right-sided chest tube with tip at the right apex. No visible right pneumothorax. Airspace disease at the bases. Endotracheal tube tip about 2.9 cm superior to the carina. Esophageal tube tip below the diaphragm but incompletely visualized. Right-sided central venous catheter tip over the SVC. Cardiomegaly IMPRESSION: 1. Interim placement of right-sided chest tube. No visible right pneumothorax. 2. Cardiomegaly with airspace disease at the bases. Electronically Signed   By: Jasmine Pang M.D.   On: 09/18/2022 18:28   DG Abd 1 View  Result Date: 09/18/2022 CLINICAL DATA:  OG tube placement EXAM: ABDOMEN - 1 VIEW COMPARISON:  Radiograph 09/18/2022, CT 07/22/2022 FINDINGS: Esophageal tube tip overlies the mid stomach. Mild airspace disease at the left base. Right central venous catheter tip over the SVC. Mild cardiomegaly. Interim focal hyper lucency at the right lung base and periphery of right chest consistent with suspected moderate size pneumothorax. IMPRESSION: 1. Esophageal tube tip overlies the mid stomach. 2. Suspect moderate size right pneumothorax. 3. Mild airspace disease at the left base. Critical  Value/emergent results were called by telephone at the time of interpretation on 09/18/2022 at 5:04 pm to provider [facility] , who verbally acknowledged these results. Electronically Signed   By: Jasmine Pang M.D.   On: 09/18/2022 17:04   DG CHEST PORT 1 VIEW  Result Date: 09/18/2022 CLINICAL DATA:  Central venous catheter placement. EXAM: PORTABLE CHEST 1 VIEW COMPARISON:  Earlier today FINDINGS: ETT tip is stable above the carina. The enteric tube courses below the level of the GE junction. Right IJ catheter has been placed. The tip is in the distal SVC. No pneumothorax identified. Stable cardiomediastinal contours. Mild hazy lung opacities are identified with a lower lung zone predominance. These appears similar to the previous exam. IMPRESSION: 1. Interval placement of right IJ catheter with tip in the distal SVC. No pneumothorax. 2. No change in aeration to the lungs compared with previous exam. Electronically Signed   By: Signa Kell M.D.   On: 09/18/2022 12:23   DG Abd 1 View  Result Date: 09/18/2022 CLINICAL DATA:  Shortness of breath status post intubation and enteric tube placement EXAM: ABDOMEN - 1 VIEW COMPARISON:  CT abdomen and pelvis dated 07/22/2022 FINDINGS: Enteric tube tip projects over the stomach with side hole at the level of the gastroesophageal junction. Partially imaged mildly dilated gas-filled transverse colon. No free air or pneumatosis. No abnormal radio-opaque calculi or mass effect. No acute or substantial osseous abnormality. IMPRESSION: 1. Enteric tube tip projects over the stomach with side hole at the level of the gastroesophageal junction. Recommend advancement by 5 cm. 2. Partially imaged mildly dilated gas-filled transverse colon, nonspecific. Electronically Signed   By: Agustin Cree M.D.   On: 09/18/2022 08:34   Portable Chest x-ray  Result Date: 09/18/2022 CLINICAL DATA:  Shortness of breath status post intubation and enteric tube placement EXAM: PORTABLE CHEST  1 VIEW COMPARISON:  Chest radiograph dated 09/16/2022 FINDINGS: Lines/tubes: Endotracheal tube tip projects 3.0 cm above the carina. Enteric tube tip reaches the diaphragm and terminates below the field of view. Chest: Improved lung volumes with bilateral patchy opacities, right-greater-than-left. Pleura: No pneumothorax or pleural effusion. Heart/mediastinum: Enlarged cardiomediastinal silhouette. Bones: No acute osseous abnormality. IMPRESSION: 1. Endotracheal tube tip projects 3.0 cm above the carina. 2. Enteric tube tip reaches the diaphragm and terminates below the field of view. 3. Improved lung  volumes with bilateral patchy opacities, right-greater-than-left, which may reflect aspiration, pneumonia, and/or asymmetric pulmonary edema in the setting of cardiomegaly. Electronically Signed   By: Agustin Cree M.D.   On: 09/18/2022 08:33   DG Chest Portable 1 View  Result Date: 09/16/2022 CLINICAL DATA:  Wheezing short of breath EXAM: PORTABLE CHEST 1 VIEW COMPARISON:  08/30/2022 FINDINGS: Mild cardiomegaly. No acute airspace disease or pleural effusion. Negative for pneumothorax. IMPRESSION: No active disease. Mild cardiomegaly. Electronically Signed   By: Jasmine Pang M.D.   On: 09/16/2022 22:03    Microbiology: Results for orders placed or performed during the hospital encounter of 09/16/22  SARS Coronavirus 2 by RT PCR (hospital order, performed in [facility] hospital lab) *cepheid single result test* Anterior Nasal Swab     Status: None   Collection Time: 09/17/22  1:01 AM   Specimen: Anterior Nasal Swab  Result Value Ref Range Status   SARS Coronavirus 2 by RT PCR NEGATIVE NEGATIVE Final    Comment: Performed at [facility], [address] [place], [place] [address]  MRSA Next Gen by PCR, Nasal     Status: None   Collection Time: 09/18/22  7:56 AM   Specimen: Nasal Mucosa; Nasal Swab  Result Value Ref Range Status   MRSA by PCR Next Gen NOT DETECTED NOT DETECTED Final    Comment:  (NOTE) The GeneXpert MRSA Assay (FDA approved for NASAL specimens only), is one component of a comprehensive MRSA colonization surveillance program. It is not intended to diagnose MRSA infection nor to guide or monitor treatment for MRSA infections. Test performance is not FDA approved in patients less than 4 years old. Performed at [facility], [address], [place], [place] [address]   Respiratory (~20 pathogens) panel by PCR     Status: Abnormal   Collection Time: 09/19/22  7:33 AM   Specimen: Nasopharyngeal Swab; Respiratory  Result Value Ref Range Status   Adenovirus NOT DETECTED NOT DETECTED Final   Coronavirus 229E NOT DETECTED NOT DETECTED Final    Comment: (NOTE) The Coronavirus on the Respiratory Panel, DOES NOT test for the novel  Coronavirus (2019 nCoV)    Coronavirus HKU1 NOT DETECTED NOT DETECTED Final   Coronavirus NL63 DETECTED (A) NOT DETECTED Final   Coronavirus OC43 NOT DETECTED NOT DETECTED Final   Metapneumovirus NOT DETECTED NOT DETECTED Final   Rhinovirus / Enterovirus DETECTED (A) NOT DETECTED Final   Influenza A NOT DETECTED NOT DETECTED Final   Influenza B NOT DETECTED NOT DETECTED Final   Parainfluenza Virus 1 NOT DETECTED NOT DETECTED Final   Parainfluenza Virus 2 NOT DETECTED NOT DETECTED Final   Parainfluenza Virus 3 NOT DETECTED NOT DETECTED Final   Parainfluenza Virus 4 NOT DETECTED NOT DETECTED Final   Respiratory Syncytial Virus NOT DETECTED NOT DETECTED Final   Bordetella pertussis NOT DETECTED NOT DETECTED Final   Bordetella Parapertussis NOT DETECTED NOT DETECTED Final   Chlamydophila pneumoniae NOT DETECTED NOT DETECTED Final   Mycoplasma pneumoniae NOT DETECTED NOT DETECTED Final    Comment: Performed at [facility] [place] [facility], [address], [place], [place] [address]    Labs: CBC: Recent Labs  Lab 09/24/22 0239 09/25/22 0044  WBC 7.5 8.5  NEUTROABS 5.0  --   HGB 11.0* 11.9*  HCT 33.8* 37.0  MCV 88.3 89.2  PLT 147* 143*    Basic Metabolic Panel: Recent Labs  Lab 09/24/22 0239 09/25/22 0044 09/29/22 0650  NA 135 135 140  K 3.7 4.0 4.3  CL 93* 94* 106  CO2 33* 29 25  GLUCOSE 256* 200* 128*  BUN 28* 22* 30*  CREATININE 0.88 0.89 0.91  CALCIUM 8.6* 8.5* 8.0*  MG 2.2 2.0  --    Liver Function Tests: No results for input(s): "AST", "ALT", "ALKPHOS", "BILITOT", "PROT", "ALBUMIN" in the last 168 hours. CBG: Recent Labs  Lab 09/29/22 0948 09/29/22 1124 09/29/22 1636 09/29/22 2054 09/30/22 0532  GLUCAP 162* 135* 110* 192* 84    Discharge time spent: greater than 30 minutes.  Signed: Coralie Keens, MD [facility] 09/30/2022

## 2022-09-30 NOTE — Progress Notes (Signed)
Mobility Specialist Progress Note:   09/30/22 1050  Mobility  Activity Ambulated with assistance in hallway  Level of Assistance Standby assist, set-up cues, supervision of patient - no hands on  Assistive Device None  Distance Ambulated (ft) 250 ft  Activity Response Tolerated well  Mobility Referral Yes  $Mobility charge 1 Mobility   Pt eager for mobility session. Required no physical assistance throughout. SpO2 WFL on RA throughout entirety of session. Pt now feels better about being on RA after showed SpO2 numbers (up to 99% on RA during ambulation). Pt left sitting EOB with all needs met, RN present.   Addison Lank Mobility Specialist Please contact via SecureChat or  Rehab office at [phone]

## 2022-09-30 NOTE — TOC Transition Note (Addendum)
Transition of Care [facility]) - CM/SW Discharge Note   Patient Details  Name: [name] [name] MRN: [id number] Date of Birth: [birth year]  Transition of Care [facility]) CM/SW Contact:  Leone Haven, RN Phone Number: 09/30/2022, 11:31 AM   Clinical Narrative:    Patient is for dc today, she will be going to her son  Tinnie Gens  house and another family member will be transporting her home  today.  She is now on 2 liters of oxygen, Jermaine with [employer] will bring oxygen to patient room for her to go home with. MD states she does not need the NIV,  [employer] will pick up the NIV when they go to set up the home oxygen at the home. Patient has a follow up apt with the HF clinic.    Final next level of care: Home/Self Care Barriers to Discharge: No Barriers Identified   Patient Goals and CMS Choice   Choice offered to / list presented to : NA  Discharge Placement                         Discharge Plan and Services Additional resources added to the After Visit Summary for                  DME Arranged: Oxygen DME Agency: [employer] Date DME Agency Contacted: 09/30/22 Time DME Agency Contacted: 1000 Representative spoke with at DME Agency: Vaughan Basta HH Arranged: NA          Social Determinants of Health (SDOH) Interventions SDOH Screenings   Food Insecurity: Food Insecurity Present (09/17/2022)  Housing: High Risk (09/17/2022)  Transportation Needs: Unmet Transportation Needs (09/17/2022)  Utilities: At Risk (09/17/2022)  Alcohol Screen: Low Risk  (12/18/2018)  Depression (PHQ2-9): Medium Risk (07/30/2020)  Financial Resource Strain: Low Risk  (05/16/2022)  Tobacco Use: High Risk (09/17/2022)     Readmission Risk Interventions    09/02/2022    2:41 PM 08/28/2022    3:52 PM 08/22/2022    3:19 PM  Readmission Risk Prevention Plan  Transportation Screening Complete Complete Complete  Medication Review Oceanographer) Complete Complete Complete  PCP or Specialist  appointment within 3-5 days of discharge Complete Complete Complete  HRI or Home Care Consult Complete Complete Complete  SW Recovery Care/Counseling Consult   Complete  Palliative Care Screening Not Applicable Not Applicable Not Applicable  Skilled Nursing Facility Not Applicable Not Applicable Not Applicable

## 2022-09-30 NOTE — Progress Notes (Signed)
Nurse requested Mobility Specialist to perform oxygen saturation test with pt which includes removing pt from oxygen both at rest and while ambulating.  Below are the results from that testing.     Patient Saturations on Room Air at Rest = spO2 90%  Patient Saturations on Room Air while Ambulating = sp02 93%   Patient Saturations on N/A Liters of oxygen while Ambulating = sp02 N/A%  At end of testing pt left in room on RA  Liters of oxygen.  Reported results to nurse.  Addison Lank Mobility Specialist Please contact via SecureChat or  Rehab office at [phone]

## 2022-09-30 NOTE — Plan of Care (Signed)
Problem: Education: Goal: Understanding of cardiac disease, CV risk reduction, and recovery process will improve Outcome: Adequate for Discharge Goal: Individualized Educational Video(s) Outcome: Adequate for Discharge   Problem: Activity: Goal: Ability to tolerate increased activity will improve Outcome: Adequate for Discharge   Problem: Cardiac: Goal: Ability to achieve and maintain adequate cardiovascular perfusion will improve Outcome: Adequate for Discharge   Problem: Health Behavior/Discharge Planning: Goal: Ability to safely manage health-related needs after discharge will improve Outcome: Adequate for Discharge   Problem: Education: Goal: Ability to describe self-care measures that may prevent or decrease complications (Diabetes Survival Skills Education) will improve Outcome: Adequate for Discharge Goal: Individualized Educational Video(s) Outcome: Adequate for Discharge   Problem: Cardiac: Goal: Ability to maintain an adequate cardiac output will improve Outcome: Adequate for Discharge   Problem: Health Behavior/Discharge Planning: Goal: Ability to identify and utilize available resources and services will improve Outcome: Adequate for Discharge Goal: Ability to manage health-related needs will improve Outcome: Adequate for Discharge   Problem: Fluid Volume: Goal: Ability to achieve a balanced intake and output will improve Outcome: Adequate for Discharge   Problem: Metabolic: Goal: Ability to maintain appropriate glucose levels will improve Outcome: Adequate for Discharge   Problem: Nutritional: Goal: Maintenance of adequate nutrition will improve Outcome: Adequate for Discharge Goal: Maintenance of adequate weight for body size and type will improve Outcome: Adequate for Discharge   Problem: Respiratory: Goal: Will regain and/or maintain adequate ventilation Outcome: Adequate for Discharge   Problem: Urinary Elimination: Goal: Ability to achieve and  maintain adequate renal perfusion and functioning will improve Outcome: Adequate for Discharge   Problem: Education: Goal: Ability to describe self-care measures that may prevent or decrease complications (Diabetes Survival Skills Education) will improve Outcome: Adequate for Discharge Goal: Individualized Educational Video(s) Outcome: Adequate for Discharge   Problem: Coping: Goal: Ability to adjust to condition or change in health will improve Outcome: Adequate for Discharge   Problem: Fluid Volume: Goal: Ability to maintain a balanced intake and output will improve Outcome: Adequate for Discharge   Problem: Health Behavior/Discharge Planning: Goal: Ability to identify and utilize available resources and services will improve Outcome: Adequate for Discharge Goal: Ability to manage health-related needs will improve Outcome: Adequate for Discharge   Problem: Metabolic: Goal: Ability to maintain appropriate glucose levels will improve Outcome: Adequate for Discharge   Problem: Nutritional: Goal: Maintenance of adequate nutrition will improve Outcome: Adequate for Discharge Goal: Progress toward achieving an optimal weight will improve Outcome: Adequate for Discharge   Problem: Skin Integrity: Goal: Risk for impaired skin integrity will decrease Outcome: Adequate for Discharge   Problem: Tissue Perfusion: Goal: Adequacy of tissue perfusion will improve Outcome: Adequate for Discharge   Problem: Education: Goal: Knowledge of General Education information will improve Description: Including pain rating scale, medication(s)/side effects and non-pharmacologic comfort measures Outcome: Adequate for Discharge   Problem: Health Behavior/Discharge Planning: Goal: Ability to manage health-related needs will improve Outcome: Adequate for Discharge   Problem: Clinical Measurements: Goal: Ability to maintain clinical measurements within normal limits will improve Outcome:  Adequate for Discharge Goal: Will remain free from infection Outcome: Adequate for Discharge Goal: Diagnostic test results will improve Outcome: Adequate for Discharge Goal: Respiratory complications will improve Outcome: Adequate for Discharge Goal: Cardiovascular complication will be avoided Outcome: Adequate for Discharge   Problem: Activity: Goal: Risk for activity intolerance will decrease Outcome: Adequate for Discharge   Problem: Coping: Goal: Level of anxiety will decrease Outcome: Adequate for Discharge   Problem: Elimination: Goal:  Will not experience complications related to bowel motility Outcome: Adequate for Discharge Goal: Will not experience complications related to urinary retention Outcome: Adequate for Discharge   Problem: Pain Managment: Goal: General experience of comfort will improve Outcome: Adequate for Discharge   Problem: Activity: Goal: Ability to tolerate increased activity will improve Outcome: Adequate for Discharge   Problem: Respiratory: Goal: Ability to maintain a clear airway and adequate ventilation will improve Outcome: Adequate for Discharge   Problem: Role Relationship: Goal: Method of communication will improve Outcome: Adequate for Discharge   Problem: Activity: Goal: Ability to tolerate increased activity will improve Outcome: Adequate for Discharge   Problem: Respiratory: Goal: Ability to maintain a clear airway and adequate ventilation will improve Outcome: Adequate for Discharge

## 2022-09-30 NOTE — Progress Notes (Signed)
Physical Therapy Treatment Patient Details Name: [name] [name] MRN: [id number] DOB: 1970-08-10 Today's Date: 09/30/2022   History of Present Illness Pt is a 52 y/o female admitted with acute exacerbation of COPD. Noted with R pneumothorax with chest tube placed; removed 4/16. Rhinovirus+. Intubated 4/10-4/13. PMH includes: nonischemic cardiomyopathy, severe MR, schizoaffective disorder, COPD/asthma, cigarette smoker, IIDM.    PT Comments    Pt tolerated treatment well today. Pt was able to ambulate independently in hallway with no AD on RA with SpO2 remaining above 93%. Pt anticipates DC today. Pt presents at or near baseline mobility. Pt has no further acute PT needs and will be signing off. Re consult PT if mobility status changes.    Recommendations for follow up therapy are one component of a multi-disciplinary discharge planning process, led by the attending physician.  Recommendations may be updated based on patient status, additional functional criteria and insurance authorization.  Follow Up Recommendations  Can patient physically be transported by private vehicle: Yes    Assistance Recommended at Discharge PRN  Patient can return home with the following A little help with walking and/or transfers;A little help with bathing/dressing/bathroom   Equipment Recommendations  None recommended by PT    Recommendations for Other Services       Precautions / Restrictions Precautions Precautions: Fall Precaution Comments: monitor O2 Restrictions Weight Bearing Restrictions: No     Mobility  Bed Mobility Overal bed mobility: Modified Independent Bed Mobility: Supine to Sit, Sit to Supine     Supine to sit: Modified independent (Device/Increase time) Sit to supine: Modified independent (Device/Increase time)        Transfers Overall transfer level: Independent Equipment used: None Transfers: Sit to/from Stand Sit to Stand: Independent                 Ambulation/Gait Ambulation/Gait assistance: Independent Gait Distance (Feet): 250 Feet Assistive device: None Gait Pattern/deviations: Antalgic, Step-through pattern, Decreased stride length, Decreased stance time - left Gait velocity: decr     General Gait Details: no LOB noted.   Stairs             Wheelchair Mobility    Modified Rankin (Stroke Patients Only)       Balance Overall balance assessment: Mild deficits observed, not formally tested                                          Cognition Arousal/Alertness: Awake/alert Behavior During Therapy: WFL for tasks assessed/performed Overall Cognitive Status: Within Functional Limits for tasks assessed                                          Exercises      General Comments General comments (skin integrity, edema, etc.): SpO2 above 93% on RA for duration of session.      Pertinent Vitals/Pain Pain Assessment Pain Assessment: No/denies pain    Home Living                          Prior Function            PT Goals (current goals can now be found in the care plan section) Progress towards PT goals: Goals met/education completed, patient discharged from PT    Frequency  Min 1X/week      PT Plan Current plan remains appropriate    Co-evaluation              AM-PAC PT "6 Clicks" Mobility   Outcome Measure  Help needed turning from your back to your side while in a flat bed without using bedrails?: None Help needed moving from lying on your back to sitting on the side of a flat bed without using bedrails?: None Help needed moving to and from a bed to a chair (including a wheelchair)?: None Help needed standing up from a chair using your arms (e.g., wheelchair or bedside chair)?: None Help needed to walk in hospital room?: None Help needed climbing 3-5 steps with a railing? : A Little 6 Click Score: 23    End of Session   Activity  Tolerance: Patient tolerated treatment well Patient left: in bed;with call bell/phone within reach Nurse Communication: Mobility status PT Visit Diagnosis: Other abnormalities of gait and mobility (R26.89);Muscle weakness (generalized) (M62.81)     Time: 1610-9604 PT Time Calculation (min) (ACUTE ONLY): 8 min  Charges:  $Gait Training: 8-22 mins                     Shela Nevin, PT, DPT Acute Rehab Services [phone]    Gladys Damme 09/30/2022, 2:08 PM
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
## 2022-09-30 NOTE — Progress Notes (Addendum)
Pt's primary nurse Darl Pikes has already gone over d/c instructions with the pt.  TOC medications filled and delivered to pt's room. Pt waiting for a phone call to her room phone from her son/daughter that they are here at the main entrance to pick her up.

## 2022-10-01 ENCOUNTER — Telehealth (HOSPITAL_COMMUNITY): Payer: Self-pay | Admitting: Licensed Clinical Social Worker

## 2022-10-01 NOTE — Telephone Encounter (Signed)
CSW attempted to contact patient with no success. CSW also left messages for patient's daughter Cicero Duck and son [name] on their voicemail for return call. CSW unable to set up transportation for upcoming appointments as we do not have a confirmed address. CSW will await return call from son or daughter. Lasandra Beech, LCSW, CCSW-MCS [phone]

## 2022-10-02 ENCOUNTER — Telehealth (HOSPITAL_COMMUNITY): Payer: Self-pay | Admitting: Licensed Clinical Social Worker

## 2022-10-02 NOTE — Telephone Encounter (Signed)
CSW contacted [facility] to follow up on patient and inquire if additional services might be available through program. Message left for return call.   CSW received return call from Son who states patient is doing well at home. He provided an updated address [address] in [place] although he state she is at work and unsure what the apartment number is. He will have patient call CSW when he gets home tonight for follow up. CSW shared that transportation will be arranged for appointment next week in [facility].  Lasandra Beech, LCSW, CCSW-MCS [phone]

## 2022-10-03 ENCOUNTER — Telehealth (HOSPITAL_COMMUNITY): Payer: Self-pay | Admitting: Licensed Clinical Social Worker

## 2022-10-03 NOTE — Telephone Encounter (Signed)
CSW received call from patient to follow up on post hospital plan. Patient states she has her medications and is doing well. She is staying with her son in an apartment although she does not have a phone during the day as he is at work. CSW reminded her of her appointment next Friday at the HF Clinic and offered transportation. Patient states she would need a ride to appointment. CSW will set up taxi transport and follow up with patient next week in the clinic. Lasandra Beech, LCSW, CCSW-MCS [phone]

## 2022-10-03 NOTE — Telephone Encounter (Signed)
Patient's son [name] called to ask if he could come by the clinic to pick up the phone this afternoon. CSW made arrangements for tracfone service and phone ready for patient pick up today. CSW entered number in EPIC to contact patient for needs. CSW continues to be available as needed. Lasandra Beech, LCSW, CCSW-MCS [phone]

## 2022-10-10 ENCOUNTER — Telehealth (HOSPITAL_COMMUNITY): Payer: Self-pay

## 2022-10-10 ENCOUNTER — Telehealth (HOSPITAL_COMMUNITY): Payer: Self-pay | Admitting: Licensed Clinical Social Worker

## 2022-10-10 NOTE — Telephone Encounter (Signed)
Called to confirm/remind patient of their appointment at the [facility] on 10/11/22. However patient's number was invalid and was unable to leave a voice message.

## 2022-10-10 NOTE — Telephone Encounter (Signed)
H&V Care Navigation CSW Progress Note  Clinical Social Worker contacted patient by phone to assist with transportation to tomorrow's appointment in the HF Clinic.  Patient is participating in a Managed Medicaid Plan:  No  Patient states need for transportation to clinic appointment tomorrow. CSW arranged roundtrip taxi ride for 2pm pick up. Patient verbalizes understanding of waiver and pick up time. See waiver below. CSW will follow up with patient at appointment tomorrow. Lasandra Beech, LCSW, CCSW-MCS [phone]   SDOH Screenings   Food Insecurity: Food Insecurity Present (09/30/2022)  Housing: High Risk (09/30/2022)  Transportation Needs: Unmet Transportation Needs (10/10/2022)  Utilities: At Risk (09/30/2022)  Alcohol Screen: Low Risk  (12/18/2018)  Depression (PHQ2-9): High Risk (09/30/2022)  Financial Resource Strain: High Risk (09/30/2022)  Tobacco Use: High Risk (09/17/2022)   10/10/2022  [name] [name] DOB: 15-Jul-1970 MRN: [id number]   RIDER WAIVER AND RELEASE OF LIABILITY  For the purposes of helping with transportation needs, [facility] partners with outside transportation providers (taxi companies, [place], Catering manager.) to give [employer] patients or other approved people the choice of on-demand rides [employer]") to our buildings for non-emergency visits.  By using [employer], I, the person signing this document, on behalf of myself and/or any legal minors (in my care using the [employer]), agree:  Science writer given to me are supplied by independent, outside transportation providers who do not work for, or have any affiliation with, [employer]. [place] is not a transportation company. [place] has no control over the quality or safety of the rides I get using [employer]. [place] has no control over whether any outside ride will happen on time or not. [facility] gives no guarantee on the reliability, quality, safety, or availability  on any rides, or that no mistakes will happen. I know and accept that traveling by vehicle (car, truck, SVU, Zenaida Niece, bus, taxi, etc.) has risks of serious injuries such as disability, being paralyzed, and death. I know and agree the risk of using [employer] is mine alone, and not [employer]. Transport Services are provided "as is" and as are available. The transportation providers are in charge for all inspections and care of the vehicles used to provide these rides. I agree not to take legal action against [place], its agents, employees, officers, directors, representatives, insurers, attorneys, assigns, successors, subsidiaries, and affiliates at any time for any reasons related directly or indirectly to using [employer]. I also agree not to take legal action against [place] or its affiliates for any injury, death, or damage to property caused by or related to using [employer]. I have read this Waiver and Release of Liability, and I understand the terms used in it and their legal meaning. This Waiver is freely and voluntarily given with the understanding that my right (or any legal minors) to legal action against [place] relating to [employer] is knowingly given up to use these services.   I attest that I read the Ride Waiver and Release of Liability to [name] [name], gave Ms. Tokarczyk the opportunity to ask questions and answered the questions asked (if any). I affirm that [name] [name] then provided consent for assistance with transportation.     Marcy Siren

## 2022-10-11 ENCOUNTER — Other Ambulatory Visit (HOSPITAL_COMMUNITY): Payer: Self-pay

## 2022-10-11 ENCOUNTER — Ambulatory Visit (HOSPITAL_COMMUNITY)
Admit: 2022-10-11 | Discharge: 2022-10-11 | Disposition: A | Discharge status: Home / Self Care | Payer: BLUE CROSS/BLUE SHIELD | Attending: Family Medicine | Admitting: Family Medicine

## 2022-10-11 ENCOUNTER — Encounter (HOSPITAL_COMMUNITY): Payer: Self-pay

## 2022-10-11 VITALS — BP 102/70 | HR 92 | Wt 169.2 lb

## 2022-10-11 DIAGNOSIS — Z91199 Patient's noncompliance with other medical treatment and regimen due to unspecified reason: Secondary | ICD-10-CM | POA: Insufficient documentation

## 2022-10-11 DIAGNOSIS — F1910 Other psychoactive substance abuse, uncomplicated: Secondary | ICD-10-CM

## 2022-10-11 DIAGNOSIS — Z9981 Dependence on supplemental oxygen: Secondary | ICD-10-CM | POA: Diagnosis not present

## 2022-10-11 DIAGNOSIS — Z7984 Long term (current) use of oral hypoglycemic drugs: Secondary | ICD-10-CM | POA: Insufficient documentation

## 2022-10-11 DIAGNOSIS — Z720 Tobacco use: Secondary | ICD-10-CM

## 2022-10-11 DIAGNOSIS — I428 Other cardiomyopathies: Secondary | ICD-10-CM | POA: Diagnosis not present

## 2022-10-11 DIAGNOSIS — Z79899 Other long term (current) drug therapy: Secondary | ICD-10-CM | POA: Diagnosis not present

## 2022-10-11 DIAGNOSIS — I340 Nonrheumatic mitral (valve) insufficiency: Secondary | ICD-10-CM

## 2022-10-11 DIAGNOSIS — F259 Schizoaffective disorder, unspecified: Secondary | ICD-10-CM | POA: Insufficient documentation

## 2022-10-11 DIAGNOSIS — I5022 Chronic systolic (congestive) heart failure: Secondary | ICD-10-CM | POA: Diagnosis not present

## 2022-10-11 DIAGNOSIS — F319 Bipolar disorder, unspecified: Secondary | ICD-10-CM | POA: Insufficient documentation

## 2022-10-11 DIAGNOSIS — M549 Dorsalgia, unspecified: Secondary | ICD-10-CM | POA: Diagnosis not present

## 2022-10-11 DIAGNOSIS — F17210 Nicotine dependence, cigarettes, uncomplicated: Secondary | ICD-10-CM | POA: Diagnosis not present

## 2022-10-11 DIAGNOSIS — E1169 Type 2 diabetes mellitus with other specified complication: Secondary | ICD-10-CM

## 2022-10-11 DIAGNOSIS — E1165 Type 2 diabetes mellitus with hyperglycemia: Secondary | ICD-10-CM | POA: Diagnosis not present

## 2022-10-11 DIAGNOSIS — Z139 Encounter for screening, unspecified: Secondary | ICD-10-CM

## 2022-10-11 LAB — BASIC METABOLIC PANEL
Anion gap: 12 (ref 5–15)
BUN: 14 mg/dL (ref 6–20)
CO2: 25 mmol/L (ref 22–32)
Calcium: 8.5 mg/dL — ABNORMAL LOW (ref 8.9–10.3)
Chloride: 97 mmol/L — ABNORMAL LOW (ref 98–111)
Creatinine, Ser: 1.32 mg/dL — ABNORMAL HIGH (ref 0.44–1.00)
GFR, Estimated: 48 mL/min — ABNORMAL LOW (ref >60)
Glucose, Bld: 361 mg/dL — ABNORMAL HIGH (ref 70–99)
Potassium: 4 mmol/L (ref 3.5–5.1)
Sodium: 134 mmol/L — ABNORMAL LOW (ref 135–145)

## 2022-10-11 LAB — BRAIN NATRIURETIC PEPTIDE: B Natriuretic Peptide: 245.9 pg/mL — ABNORMAL HIGH (ref 0.0–100.0)

## 2022-10-11 LAB — TSH: TSH: 0.343 u[IU]/mL — ABNORMAL LOW (ref 0.350–4.500)

## 2022-10-11 MED ORDER — ASPIRIN LOW DOSE 81 MG PO TBEC
81.0000 mg | DELAYED_RELEASE_TABLET | Freq: Every day | ORAL | 8 refills | Status: DC
  Filled 2022-10-11: qty 30, 30d supply, fill #0

## 2022-10-11 MED ORDER — LOSARTAN POTASSIUM 25 MG PO TABS
12.5000 mg | ORAL_TABLET | Freq: Every day | ORAL | 8 refills | Status: DC
  Filled 2022-10-11: qty 15, 30d supply, fill #0
  Filled 2022-11-08: qty 15, 30d supply, fill #1

## 2022-10-11 MED ORDER — POTASSIUM CHLORIDE CRYS ER 10 MEQ PO TBCR
10.0000 meq | EXTENDED_RELEASE_TABLET | Freq: Every day | ORAL | 8 refills | Status: DC
  Filled 2022-10-11 – 2022-11-08 (×2): qty 30, 30d supply, fill #0

## 2022-10-11 MED ORDER — IPRATROPIUM-ALBUTEROL 0.5-2.5 (3) MG/3ML IN SOLN
3.0000 mL | Freq: Four times a day (QID) | RESPIRATORY_TRACT | 0 refills | Status: DC | PRN
  Filled 2022-10-11: qty 360, 30d supply, fill #0

## 2022-10-11 MED ORDER — TOUJEO SOLOSTAR 300 UNIT/ML ~~LOC~~ SOPN
24.0000 [IU] | PEN_INJECTOR | Freq: Every day | SUBCUTANEOUS | 0 refills | Status: DC
  Filled 2022-10-11: qty 3, 38d supply, fill #0

## 2022-10-11 MED ORDER — ISOSORBIDE MONONITRATE ER 30 MG PO TB24
30.0000 mg | ORAL_TABLET | Freq: Every day | ORAL | 8 refills | Status: DC
  Filled 2022-10-11: qty 30, 30d supply, fill #0

## 2022-10-11 MED ORDER — RISPERIDONE 2 MG PO TABS
1.0000 mg | ORAL_TABLET | ORAL | 0 refills | Status: DC
  Filled 2022-10-11: qty 45, 30d supply, fill #0

## 2022-10-11 MED ORDER — METHIMAZOLE 5 MG PO TABS
5.0000 mg | ORAL_TABLET | Freq: Every day | ORAL | 0 refills | Status: DC
  Filled 2022-10-11 – 2022-11-08 (×2): qty 30, 30d supply, fill #0

## 2022-10-11 MED ORDER — METOPROLOL SUCCINATE ER 25 MG PO TB24
12.5000 mg | ORAL_TABLET | Freq: Every day | ORAL | 8 refills | Status: DC
  Filled 2022-10-11 – 2022-11-08 (×2): qty 15, 30d supply, fill #0

## 2022-10-11 MED ORDER — BUMETANIDE 2 MG PO TABS
2.0000 mg | ORAL_TABLET | Freq: Two times a day (BID) | ORAL | 8 refills | Status: DC
  Filled 2022-10-11 – 2022-11-08 (×2): qty 60, 30d supply, fill #0

## 2022-10-11 MED ORDER — ROSUVASTATIN CALCIUM 20 MG PO TABS
20.0000 mg | ORAL_TABLET | Freq: Every day | ORAL | 8 refills | Status: DC
  Filled 2022-10-11: qty 30, 30d supply, fill #0

## 2022-10-11 MED ORDER — EMPAGLIFLOZIN 10 MG PO TABS
10.0000 mg | ORAL_TABLET | Freq: Every day | ORAL | 8 refills | Status: DC
  Filled 2022-10-11 – 2022-11-08 (×3): qty 30, 30d supply, fill #0

## 2022-10-11 NOTE — Patient Instructions (Addendum)
Thank you for coming in today  Labs were done today, if any labs are abnormal the clinic will call you No news is good news  Medications: START Losartan 12.5 mg 1/2 tablet nightly  Spironolactone 12.5 mg 1/2 tablet daily   Follow up appointments:  Your physician recommends that you schedule a follow-up appointment in:  2 weeks, 4 weeks and 6 weeks in clinic   Do the following things EVERYDAY: Weigh yourself in the morning before breakfast. Write it down and keep it in a log. Take your medicines as prescribed Eat low salt foods--Limit salt (sodium) to 2000 mg per day.  Stay as active as you can everyday Limit all fluids for the day to less than 2 liters   At the [facility], you and your health needs are our priority. As part of our continuing mission to provide you with exceptional heart care, we have created designated Provider Care Teams. These Care Teams include your primary Cardiologist (physician) and Advanced Practice Providers (APPs- Physician Assistants and Nurse Practitioners) who all work together to provide you with the care you need, when you need it.   You may see any of the following providers on your designated Care Team at your next follow up: Dr Arvilla Meres Dr Marca Ancona Dr. Marcos Eke, NP Robbie Lis, [place] [facility] [place], [place] Brynda Peon, NP Karle Plumber, PharmD   Please be sure to bring in all your medications bottles to every appointment.    Thank you for choosing [facility]  If you have any questions or concerns before your next appointment please send [place] a message through [place] or call our office at [phone].    TO LEAVE A MESSAGE FOR THE NURSE SELECT OPTION 2, PLEASE LEAVE A MESSAGE INCLUDING: YOUR NAME DATE OF BIRTH CALL BACK NUMBER REASON FOR CALL**this is important as we prioritize the call backs  YOU WILL RECEIVE A CALL BACK THE SAME  DAY AS LONG AS YOU CALL BEFORE 4:00 PM

## 2022-10-11 NOTE — Progress Notes (Signed)
ADVANCED HF CLINIC CONSULT NOTE  PCP: Gwinda Passe, NP Primary Cardiologist: Jodelle Red, MD HF Cardiologist: Dr. Gasper Lloyd  HPI: [name] [name] is a 52 y.o. female with nonischemic cardiomyopathy (LVEF 20%), severe MR, schizoaffective disorder.  [name] cardiac history dates back to spring of 2022 when she was originally diagnosed with HFrEF. She has previously been seen at [facility] where she has multiple hospital admissions over the past 1 year. She was previously followed by the advanced heart failure team at [facility] where she was not deemed a candidate for advanced therapies due to ongoing smoking, psychosocial barriers, schizoaffective disorder and chronic pain disorder. In July 2023 she was asdmitted to the ICU there for cardiogenic shock requiring inotropes/pressors and aggressive diuresis. Hospice was suggested, however, patient states she is unable to receive hospice support because her daughter does not want nurses in their home.   Admitted 12/23 with a/c systolic HF. Diuresed with IV lasix. Echo showed EF < 20%, RV mildly reduced, severe centrally directed MR. Felt to have end-stage CM. GMT titrated, and she was discharged home, weight 172 lbs. She declined follow up with AHF.  Admitted 2/24 with a/c systolic HF and mild COPD exacerbation. Diuresed with IV lasix. Echo showed EF < 20%, RV ok, moderate MR. Hospitalization complicate by AKI. Continued on metoprolol, Imdur, and Bumex at discharge, weight 185 lbs.  Treated in ED 08/19/22 with AECOPD. Seen in ED 08/20/22 with CP. HsTroponins flat. Deemed stable for discharge.  Admitted 08/21/22 with hyperglycemia and AKI. Admitted 09/01/22 with nSTEMI. Started on IV heparin. Echo showed EF 20-25%, RV ok, no WMAs. With no CP and CBGs>500, LHC deferred. She required IV insulin to control her blood glucose. She was discharged home, weight 184 lbs.  Several ED visits for hyperglycemia.  Admitted  4/24 with AECOPD after smoking THC from new dealer. Failed BiPap and required intubation. Had R pneumo and required placement of chest tube. Eventually extubated, drips weaned and discharged home, weight 185 lbs.  Today she returns for post hospital follow up. Overall feeling fine recently. Denies palpitations, CP, dizziness only with brisk movements. No edema, or PND/Orthopnea. no SOB. Appetite fine, although doesn't have much to eat at the house. Only eats a couple teaspoons of peanut butter throughout the day when theres not enough food at home. No fever or chills. Does not weight at home, does not have scale (provided one today). Taking all medications. Started smoking again, smoking 10 cigarette's a day, hard to quit with son smoking in the house. Lives with her son at his friends house. No alcohol.   Review of Systems: [y] = yes, [ ]  = no   General: Weight gain [ ] ; Weight loss [ ] ; Anorexia [ ] ; Fatigue [ ] ; Fever [ ] ; Chills [ ] ; Weakness [ ]   Cardiac: Chest pain/pressure [ ] ; Resting SOB [ ] ; Exertional SOB [ ] ; Orthopnea [ ] ; Pedal Edema [ ] ; Palpitations [ ] ; Syncope [ ] ; Presyncope [ ] ; Paroxysmal nocturnal dyspnea[ ]   Pulmonary: Cough [ Y]; Wheezing[ ] ; Hemoptysis[ ] ; Sputum [ ] ; Snoring [ ]   GI: Vomiting[ ] ; Dysphagia[ ] ; Melena[ ] ; Hematochezia [ ] ; Heartburn[ ] ; Abdominal pain [ ] ; Constipation [ ] ; Diarrhea [ ] ; BRBPR [ ]   GU: Hematuria[ ] ; Dysuria [ ] ; Nocturia[ ]   Vascular: Pain in legs with walking [ ] ; Pain in feet with lying flat [ ] ; Non-healing sores [ ] ; Stroke [ ] ; TIA [ ] ; Slurred speech [ ] ;  Neuro: Headaches[ ] ; Vertigo[ ] ; Seizures[ ] ; Paresthesias[ ] ;Blurred vision [ ] ; Diplopia [ ] ; Vision changes [ ]  Dizziness [Y] Ortho/Skin: Arthritis [ ] ; Joint pain [ ] ; Muscle pain [ ] ; Joint swelling [ ] ; Back Pain [Y ]; Rash [ ]   Psych: Depression[ ] ; Anxiety[ ]   Heme: Bleeding problems [ ] ; Clotting disorders [ ] ; Anemia [ ]   Endocrine: Diabetes [ ] ; Thyroid dysfunction[  ]  Past Medical History:  Diagnosis Date   Anxiety disorder    Asthma    Bipolar affective disorder (HCC)    Blood transfusion without reported diagnosis    CHF (congestive heart failure) (HCC)    COPD (chronic obstructive pulmonary disease) (HCC)    Depression    Diabetes mellitus    Migraine    Schizophrenia (HCC)    Current Outpatient Medications  Medication Sig Dispense Refill   acetaminophen (TYLENOL) 325 MG tablet Take 1 tablet (325 mg total) by mouth every 6 (six) hours as needed for headache or mild pain. 60 tablet 0   albuterol (VENTOLIN HFA) 108 (90 Base) MCG/ACT inhaler Inhale 2 puffs into the lungs every 6 (six) hours as needed for wheezing or shortness of breath. 18 g 2   ASPIRIN LOW DOSE 81 MG tablet Take 1 tablet (81 mg total) by mouth daily. 30 tablet 0   blood glucose meter kit and supplies Dispense based on patient and insurance preference. Use up to four times daily as directed. (FOR ICD-10 E10.9, E11.9). 1 each 0   bumetanide (BUMEX) 2 MG tablet Take 1 tablet (2 mg total) by mouth 2 (two) times daily. 60 tablet 0   Cholecalciferol (VITAMIN D3) 50 MCG (2000 UT) CAPS Patient takes 1 tablet by mouth once a week on Fridays.     empagliflozin (JARDIANCE) 10 MG TABS tablet Take 1 tablet (10 mg total) by mouth daily. 30 tablet 0   insulin glargine, 1 Unit Dial, (TOUJEO SOLOSTAR) 300 UNIT/ML Solostar Pen Inject 24 Units into the skin at bedtime. 3 mL 0   Insulin Pen Needle 32G X 4 MM MISC Use As Directed 100 each 0   isosorbide mononitrate (IMDUR) 30 MG 24 hr tablet Take 1 tablet (30 mg total) by mouth daily. 30 tablet 0   methimazole (TAPAZOLE) 5 MG tablet Take 1 tablet (5 mg total) by mouth daily for 21 days. 21 tablet 0   metoprolol succinate (TOPROL-XL) 25 MG 24 hr tablet Take 0.5 tablets (12.5 mg total) by mouth daily. 15 tablet 0   mometasone-formoterol (DULERA) 200-5 MCG/ACT AERO Inhale 2 puffs into the lungs 2 (two) times daily. 13 g 0   nitroGLYCERIN (NITROSTAT) 0.4  MG SL tablet Place 1 tablet (0.4 mg total) under the tongue every 5 (five) minutes as needed for chest pain. 50 tablet 0   oxyCODONE-acetaminophen (PERCOCET) 10-325 MG tablet Take 1 tablet by mouth every 6 (six) hours as needed for pain. 15 tablet 0   potassium chloride (KLOR-CON M) 10 MEQ tablet Take 1 tablet (10 mEq total) by mouth daily. 30 tablet 0   Potassium Chloride (KLOR-CON PO) Take 20 mEq by mouth daily.     risperiDONE (RISPERDAL) 2 MG tablet Take one-half tablet in the morning and one tablet in the evening. 45 tablet 0   rosuvastatin (CRESTOR) 20 MG tablet Take 1 tablet (20 mg total) by mouth daily. 30 tablet 0   spironolactone (ALDACTONE) 25 MG tablet Take 0.5 tablets (12.5 mg total) by mouth daily. 15 tablet 0   ipratropium-albuterol (  DUONEB) 0.5-2.5 (3) MG/3ML SOLN Take 3 mLs by nebulization every 6 (six) hours as needed. 360 mL 0   No current facility-administered medications for this encounter.   Allergies  Allergen Reactions   Tramadol Nausea Only and Rash   Benadryl [Diphenhydramine]     Decreases motor skills and increases anxiety   Lactose Intolerance (Gi)    Wellbutrin [Bupropion] Other (See Comments)    rage   Nicotine Rash    Patient states she is allergic to the Nicotine patch and they cause her to break out in a rash.  She states she can smoke cigarettes.   Social History   Socioeconomic History   Marital status: Divorced    Spouse name: Not on file   Number of children: Not on file   Years of education: Not on file   Highest education level: Not on file  Occupational History   Not on file  Tobacco Use   Smoking status: Every Day    Packs/day: 1.00    Years: 29.00    Additional pack years: 0.00    Total pack years: 29.00    Types: Cigarettes   Smokeless tobacco: Never  Vaping Use   Vaping Use: Former  Substance and Sexual Activity   Alcohol use: Not Currently    Comment: occasionally   Drug use: No   Sexual activity: Yes    Birth  control/protection: Surgical  Other Topics Concern   Not on file  Social History Narrative   Patient lives in [place] with her 2 kids- 41 in 42 years old.   She has total 4 kids.   She is a single mom.         Social Determinants of Health   Financial Resource Strain: High Risk (09/30/2022)   Overall Financial Resource Strain (CARDIA)    Difficulty of Paying Living Expenses: Hard  Food Insecurity: Food Insecurity Present (09/30/2022)   Hunger Vital Sign    Worried About Running Out of Food in the Last Year: Often true    Ran Out of Food in the Last Year: Often true  Transportation Needs: Unmet Transportation Needs (10/10/2022)   PRAPARE - Administrator, Civil Service (Medical): Yes    Lack of Transportation (Non-Medical): Yes  Physical Activity: Not on file  Stress: Not on file  Social Connections: Not on file  Intimate Partner Violence: Not At Risk (09/17/2022)   Humiliation, Afraid, Rape, and Kick questionnaire    Fear of Current or Ex-Partner: No    Emotionally Abused: No    Physically Abused: No    Sexually Abused: No    Family History  Problem Relation Age of Onset   Bipolar disorder Mother    Hypertension Father    Diabetes Father     Vitals:   10/11/22 1441  BP: 102/70  Pulse: 92  SpO2: 95%  Weight: 76.7 kg (169 lb 3.2 oz)    PHYSICAL EXAM: General: chronically ill appearing.  No respiratory difficulty. Walked into clinic HEENT: normal Neck: supple. JVD flat. Carotids 2+ bilat; no bruits. No lymphadenopathy or thyromegaly appreciated. Cor: PMI nondisplaced. Regular rate & rhythm. No rubs, gallops or murmurs. Lungs: exp wheeze Abdomen: soft, nontender, nondistended. No hepatosplenomegaly. No bruits or masses. Good bowel sounds. Extremities: no cyanosis, clubbing, rash, edema  Neuro: alert & oriented x 3, cranial nerves grossly intact. moves all 4 extremities w/o difficulty. Affect pleasant.   Wt Readings from Last 3 Encounters:  10/11/22 76.7  kg (169 lb  3.2 oz)  09/30/22 84.1 kg (185 lb 4.8 oz)  09/02/22 83.7 kg (184 lb 8.4 oz)   EKG: NSR 93 bpm (Personally reviewed)    ASSESSMENT & PLAN: Chronic Systolic Heart Failure, NiCM - As noted above, extensive evaluation at [facility]; patient not deemed a candidate for advanced therapies due to psychosocial barriers including continued tobacco use, lack of social support, possible schizoaffective disorder; was discharged on hospice, however, patient states daughter would not want nursing support in her home. - Echo (12/23):  EF < 20%, RV mildly reduced, severe centrally directed MR - limited echo (3/24): EF 20-25%, RV ok, severe functional MR - NYHA II- early III, volume stable - Continue bumex 2 mg BID with 10 mEq daily - Continue Jardiance 10 mg daily - Continue Imdur 30 mg daily - Continue metoprolol succinate 12.5 mg daily - Continue spironolactone 25 mg daily - BMET, BNP  - pill box and scale provided today as well as transportation to future appts   2. Severe functional MR - Severe centrally directed MR on TTE - Diurese and afterload reduction.  - Would be good candidate for TEER; will follow up outpatient after medical optimization  3. Bipolar I Disorder - [facility] admission 11/15 for mania and delusions. - IVC 06/2018. - Previous psych meds managed by [facility] - Continue risperidone  - PCP or f/u with [facility] for psych issues  4. DM2 - Uncontrolled. - Hgb 11.0 - Continue SGLT2i, no GU symptoms - follow up with PCP  5. Substance abuse/tobacco use - Started smoking again, 10 cigarettes/ day - chronic oxy use for back pain, managed by pain clinic - cessation encouraged   6. SDOH - HFSW helping with resources - She has new patient PCP appt 10/16/22, transportation being provided - Needs help with transportation to appts. - Issues with noncompliance however is willing to come into frequent appt to optimize GDMT - food provided  Plan to follow up in AHF every 2  weeks x3 visits. Lots of social issues that need to be tackled during following admissions. Medicines picked up from pharmacy for her. Plan to establish PCP next week, transportation being provided.   Brynda Peon, AGACNP-BC  10/11/22  Advanced Heart Failure Team

## 2022-10-12 LAB — T3: T3, Total: 108 ng/dL (ref 71–180)

## 2022-10-12 LAB — T4: T4, Total: 6.9 ug/dL (ref 4.5–12.0)

## 2022-10-15 ENCOUNTER — Telehealth (INDEPENDENT_AMBULATORY_CARE_PROVIDER_SITE_OTHER): Payer: Self-pay

## 2022-10-15 NOTE — Telephone Encounter (Signed)
Tried reaching out to pt in regards to her appt for tomorrow pt didn't answer received the message " Call can't be completed as dialed" Wanted to make pt aware that appt for tomorrow will be virtual  Tried both number is chart

## 2022-10-16 ENCOUNTER — Telehealth (HOSPITAL_COMMUNITY): Payer: Self-pay | Admitting: Licensed Clinical Social Worker

## 2022-10-16 ENCOUNTER — Encounter (INDEPENDENT_AMBULATORY_CARE_PROVIDER_SITE_OTHER): Payer: Medicaid Other | Admitting: Primary Care

## 2022-10-16 ENCOUNTER — Telehealth (HOSPITAL_COMMUNITY): Payer: Self-pay | Admitting: Family Medicine

## 2022-10-16 NOTE — Addendum Note (Signed)
Encounter addended by: Marcy Siren, LCSW on: 10/16/2022 9:52 AM  Actions taken: Flowsheet accepted, Clinical Note Signed

## 2022-10-16 NOTE — Progress Notes (Signed)
Heart and Vascular Care Navigation  10/16/2022  [name] [name] May 25, 1971 [id number]  Reason for Referral: Patient seen in HF Clinic post hospitalization Patient is participating in a Managed Medicaid Plan:Yes Traditional Medicaid  Engaged with patient face to face for follow up visit for Heart and Vascular Care Coordination.                                                                                                   Assessment: Patient is a 52 yo female who reports she has been homeless since December, 2023. She states she was living with her son [name] [name] [name] [name] in [place]) and his wife (now separated) and she states she "just left" in December. She states that her son brought her to a homeless shelter here in [place] "off [address] She was at the [facility] since December, 2023 and admits to intermittent homelessness. She has 3 sons [name] [name], [name] [name] and [name] [name] who committed suicide 4 years ago) and 1 daughter [name] [name]. She states she receives SSI since she was about 52 years old when she was diagnosed with bipolar. She reports she has not had any mental health follow up in years and is currently going to [facility] for pain treatment and "they give me Risperdal 2x daily for my Bipolar". Patient unsure when her last appointment at [facility] and not sure of any follow up. Patient states she likes [name] [name] and will consider additional resources for mental health follow up. Patient states she has limited food at home and Son does what he can as they just moved into this new apartment upon discharge from the hospital. Clinic staff assisted with clearing up her medications and assuring that she had the correct meds on hand and provided a med box to assist with compliance. CSW provided a cell phone to communicate with patient to assure follow up and assist with transportation to upcoming appointments.   HRT/VAS Care Coordination     Patients Home Cardiology Office Heart Failure Clinic   Outpatient Care Team  Social Worker   Social Worker Name: [name] [name], [place] [phone]   Living arrangements for the past 2 months Apartment   Lives with: Adult Children   Patient Current Insurance Coverage Medicaid   Patient Has Concern With Paying Medical Bills No   Does Patient Have Prescription Coverage? Yes   Home Assistive Devices/Equipment None   DME Agency [employer]   [facility] Agency NA       Social History:                                                                             SDOH Screenings   Food Insecurity: Food Insecurity Present (09/30/2022)  Housing: High Risk (09/30/2022)  Transportation Needs: Unmet Transportation Needs (10/10/2022)  Utilities: At Risk (09/30/2022)  Alcohol Screen: Low  Risk  (12/18/2018)  Depression (PHQ2-9): High Risk (09/30/2022)  Financial Resource Strain: High Risk (09/30/2022)  Tobacco Use: High Risk (10/11/2022)    SDOH Interventions: Financial Resources:    N/a  Food Insecurity:   H[employer]V Food [employer]  Housing Insecurity:   N/a  Transportation:    Assisted with Taxi ride   Other Care Navigation Interventions:     Inpatient/Outpatient Substance Abuse Counseling/Rehab Options N/a  Provided Pharmacy assistance resources    Patient expressed Mental Health concerns CSW will continue discussions with patient around community options for follow up  Patient Referred to: N/a   Follow-up plan:  CSW discussed ongoing support and follow up through the HF Clinic with the hopes of connecting patient to some mental health follow up. CSW will assist with transportation resources and help patient navigate the healthcare system to improve her manage her health/mental health needs. Patient agreeable to plan and will return in 2 weeks for continued heart failure management and follow up. [name] Beech, LCSW, CCSW-MCS [phone]

## 2022-10-16 NOTE — Telephone Encounter (Signed)
CSW contacted [facility] [phone] a medicaid mental health resource regarding available services to assist patient. CSW shared multiple ED and Hospital admissions since early December and concern for mental health follow up. [facility] discussed resources and an opportunity for Case Management services although the referral process requires patient to agree and contact to request services. CSW shared that at this time unable to reach patient but will reach out once contact has been made with patient. Lasandra Beech, LCSW, CCSW-MCS [phone]

## 2022-10-16 NOTE — Telephone Encounter (Signed)
CSW attempted to contact patient yesterday and again this morning to assist with needs for follow up appointments as well as remind of her PCP appointment today. Unfortunately, both calls resulted in a message "the person you are calling cannot accept calls at this time". CSW unable to leave a message. Lasandra Beech, LCSW, CCSW-MCS [phone]

## 2022-10-16 NOTE — Telephone Encounter (Signed)
CSW unable to contact patient via phone and concerned about follow up so made a home visit. Patient answered the door and confirmed she has the cell phone provided and it was charged. Apparently, there was an issue with incoming calls although she was able to make outgoing calls. The issue was resolved and patient now able to accept incoming calls. CSW contacted [facility] to inform of operating phone for scheduled phone visit this afternoon. CSW continues to follow for coordination of care. Lasandra Beech, LCSW, CCSW-MCS [phone]

## 2022-10-16 NOTE — Telephone Encounter (Signed)
Called patient's son, daughter and sister, and left messages on all voice mails that AHF clinic is trying to get in touch with [name] [name], and to please pass along reminder of her upcoming PCP and AHF appts.  Prince Rome, FNP-BC 10/16/22

## 2022-10-17 ENCOUNTER — Encounter (HOSPITAL_COMMUNITY): Payer: Self-pay | Admitting: Cardiology

## 2022-10-23 ENCOUNTER — Telehealth (HOSPITAL_COMMUNITY): Payer: Self-pay

## 2022-10-23 NOTE — Telephone Encounter (Signed)
Tried calling patient however, patient's number was invalid and was unable to leave a voice message to confirm/remind patient of their appointment at the [facility] Failure Clinic on 10/24/22.

## 2022-10-23 NOTE — Progress Notes (Signed)
ADVANCED HF CLINIC NOTE  PCP: Gwinda Passe, NP Primary Cardiologist: Jodelle Red, MD HF Cardiologist: Dr. Gasper Lloyd  HPI: [name] [name] is a 52 y.o. female with nonischemic cardiomyopathy (LVEF 20%), severe MR, schizoaffective disorder.  Ms. Walas cardiac history dates back to spring of 2022 when she was originally diagnosed with HFrEF. She has previously been seen at [facility] where she has multiple hospital admissions over the past 1 year. She was previously followed by the advanced heart failure team at [facility] where she was not deemed a candidate for advanced therapies due to ongoing smoking, psychosocial barriers, schizoaffective disorder and chronic pain disorder. In July 2023 she was asdmitted to the ICU there for cardiogenic shock requiring inotropes/pressors and aggressive diuresis. Hospice was suggested, however, patient states she is unable to receive hospice support because her daughter does not want nurses in their home.   Admitted 12/23 with a/c systolic HF. Diuresed with IV lasix. Echo showed EF < 20%, RV mildly reduced, severe centrally directed MR. Felt to have end-stage CM. GMT titrated, and she was discharged home, weight 172 lbs. She declined follow up with AHF.  Admitted 2/24 with a/c systolic HF and mild COPD exacerbation. Diuresed with IV lasix. Echo showed EF < 20%, RV ok, moderate MR. Hospitalization complicate by AKI. Continued on metoprolol, Imdur, and Bumex at discharge, weight 185 lbs.  Treated in ED 08/19/22 with AECOPD. Seen in ED 08/20/22 with CP. HsTroponins flat. Deemed stable for discharge.  Admitted 08/21/22 with hyperglycemia and AKI. Admitted 09/01/22 with nSTEMI. Started on IV heparin. Echo showed EF 20-25%, RV ok, no WMAs. With no CP and CBGs>500, LHC deferred. She required IV insulin to control her blood glucose. She was discharged home, weight 184 lbs.  Several ED visits for hyperglycemia.  Admitted 4/24 with  AECOPD after smoking THC from new dealer. Failed BiPap and required intubation. Had R pneumo and required placement of chest tube. Eventually extubated, drips weaned and discharged home, weight 185 lbs.  Today she returns for AHF follow up. Overall feeling ***. Denies palpitations, CP, dizziness, edema, or PND/Orthopnea. *** SOB. Appetite ok. No fever or chills. Weight at home *** pounds. Taking all medications. ETOH, smoking *** . Started smoking again, smoking 10 cigarette's a day, hard to quit with son smoking in the house. Lives with her son at his friends house. No alcohol.    Past Medical History:  Diagnosis Date   Anxiety disorder    Asthma    Bipolar affective disorder (HCC)    Blood transfusion without reported diagnosis    CHF (congestive heart failure) (HCC)    COPD (chronic obstructive pulmonary disease) (HCC)    Depression    Diabetes mellitus    Migraine    Schizophrenia (HCC)    Current Outpatient Medications  Medication Sig Dispense Refill   acetaminophen (TYLENOL) 325 MG tablet Take 1 tablet (325 mg total) by mouth every 6 (six) hours as needed for headache or mild pain. 60 tablet 0   albuterol (VENTOLIN HFA) 108 (90 Base) MCG/ACT inhaler Inhale 2 puffs into the lungs every 6 (six) hours as needed for wheezing or shortness of breath. 18 g 2   ASPIRIN LOW DOSE 81 MG tablet Take 1 tablet (81 mg total) by mouth daily. 30 tablet 8   blood glucose meter kit and supplies Dispense based on patient and insurance preference. Use up to four times daily as directed. (FOR ICD-10 E10.9, E11.9). 1 each 0  bumetanide (BUMEX) 2 MG tablet Take 1 tablet (2 mg total) by mouth 2 (two) times daily. 60 tablet 8   Cholecalciferol (VITAMIN D3) 50 MCG (2000 UT) CAPS Patient takes 1 tablet by mouth once a week on Fridays.     empagliflozin (JARDIANCE) 10 MG TABS tablet Take 1 tablet (10 mg total) by mouth daily. 30 tablet 8   insulin glargine, 1 Unit Dial, (TOUJEO SOLOSTAR) 300 UNIT/ML Solostar  Pen Inject 24 Units into the skin at bedtime. 3 mL 0   Insulin Pen Needle 32G X 4 MM MISC Use As Directed 100 each 0   ipratropium-albuterol (DUONEB) 0.5-2.5 (3) MG/3ML SOLN inhale 3 mLs by nebulization every 6 (six) hours as needed. 360 mL 0   isosorbide mononitrate (IMDUR) 30 MG 24 hr tablet Take 1 tablet (30 mg total) by mouth daily. 30 tablet 8   losartan (COZAAR) 25 MG tablet Take 1/2 tablet (12.5 mg total) by mouth daily. 15 tablet 8   methimazole (TAPAZOLE) 5 MG tablet Take 1 tablet (5 mg total) by mouth daily. 30 tablet 0   metoprolol succinate (TOPROL-XL) 25 MG 24 hr tablet Take 0.5 tablets (12.5 mg total) by mouth daily. 15 tablet 8   mometasone-formoterol (DULERA) 200-5 MCG/ACT AERO Inhale 2 puffs into the lungs 2 (two) times daily. 13 g 0   nitroGLYCERIN (NITROSTAT) 0.4 MG SL tablet Place 1 tablet (0.4 mg total) under the tongue every 5 (five) minutes as needed for chest pain. 50 tablet 0   oxyCODONE-acetaminophen (PERCOCET) 10-325 MG tablet Take 1 tablet by mouth every 6 (six) hours as needed for pain. 15 tablet 0   potassium chloride (KLOR-CON M) 10 MEQ tablet Take 1 tablet (10 mEq total) by mouth daily. 30 tablet 8   risperiDONE (RISPERDAL) 2 MG tablet Take 1/2 tablet in the morning and one tablet in the evening. 45 tablet 0   rosuvastatin (CRESTOR) 20 MG tablet Take 1 tablet (20 mg total) by mouth daily. 30 tablet 8   spironolactone (ALDACTONE) 25 MG tablet Take 0.5 tablets (12.5 mg total) by mouth daily. 15 tablet 0   No current facility-administered medications for this visit.   Allergies  Allergen Reactions   Tramadol Nausea Only and Rash   Benadryl [Diphenhydramine]     Decreases motor skills and increases anxiety   Lactose Intolerance (Gi)    Wellbutrin [Bupropion] Other (See Comments)    rage   Nicotine Rash    Patient states she is allergic to the Nicotine patch and they cause her to break out in a rash.  She states she can smoke cigarettes.   Social History    Socioeconomic History   Marital status: Divorced    Spouse name: Not on file   Number of children: Not on file   Years of education: Not on file   Highest education level: Not on file  Occupational History   Not on file  Tobacco Use   Smoking status: Every Day    Packs/day: 1.00    Years: 29.00    Additional pack years: 0.00    Total pack years: 29.00    Types: Cigarettes   Smokeless tobacco: Never  Vaping Use   Vaping Use: Former  Substance and Sexual Activity   Alcohol use: Not Currently    Comment: occasionally   Drug use: No   Sexual activity: Yes    Birth control/protection: Surgical  Other Topics Concern   Not on file  Social History Narrative   Patient  lives in [place] with her 2 kids- 60 in 55 years old.   She has total 4 kids.   She is a single mom.         Social Determinants of Health   Financial Resource Strain: High Risk (09/30/2022)   Overall Financial Resource Strain (CARDIA)    Difficulty of Paying Living Expenses: Hard  Food Insecurity: Food Insecurity Present (09/30/2022)   Hunger Vital Sign    Worried About Running Out of Food in the Last Year: Often true    Ran Out of Food in the Last Year: Often true  Transportation Needs: Unmet Transportation Needs (10/10/2022)   PRAPARE - Administrator, Civil Service (Medical): Yes    Lack of Transportation (Non-Medical): Yes  Physical Activity: Not on file  Stress: Not on file  Social Connections: Not on file  Intimate Partner Violence: Not At Risk (09/17/2022)   Humiliation, Afraid, Rape, and Kick questionnaire    Fear of Current or Ex-Partner: No    Emotionally Abused: No    Physically Abused: No    Sexually Abused: No    Family History  Problem Relation Age of Onset   Bipolar disorder Mother    Hypertension Father    Diabetes Father     There were no vitals filed for this visit.   PHYSICAL EXAM: General:  *** appearing.  No respiratory difficulty HEENT: normal Neck: supple.  JVD *** cm. Carotids 2+ bilat; no bruits. No lymphadenopathy or thyromegaly appreciated. Cor: PMI nondisplaced. Regular rate & rhythm. No rubs, gallops or murmurs. Lungs: clear Abdomen: soft, nontender, nondistended. No hepatosplenomegaly. No bruits or masses. Good bowel sounds. Extremities: no cyanosis, clubbing, rash, edema  Neuro: alert & oriented x 3, cranial nerves grossly intact. moves all 4 extremities w/o difficulty. Affect pleasant.   Wt Readings from Last 3 Encounters:  10/11/22 76.7 kg (169 lb 3.2 oz)  09/30/22 84.1 kg (185 lb 4.8 oz)  09/02/22 83.7 kg (184 lb 8.4 oz)   EKG: NSR 93 bpm (Personally reviewed from 10/11/22)    ASSESSMENT & PLAN: Chronic Systolic Heart Failure, NiCM - As noted above, [facility]; patient not deemed a candidate for advanced therapies due to psychosocial barriers including continued tobacco use, lack of social support, possible schizoaffective disorder; was discharged on hospice, however, patient states daughter would not want nursing support in her home. - Echo (12/23):  EF < 20%, RV mildly reduced, severe centrally directed MR - limited echo (3/24): EF 20-25%, RV ok, severe functional MR - NYHA II- early III, volume stable - Continue bumex 2 mg BID with 10 mEq daily - Continue Jardiance 10 mg daily - Continue Imdur 30 mg daily - Continue metoprolol succinate 12.5 mg daily - Continue spironolactone 25 mg daily - BMET, BNP  - pill box and scale provided today as well as transportation to future appts   2. Severe functional MR - Severe centrally directed MR on TTE - Diurese and afterload reduction.  - Would be good candidate for TEER; will follow up outpatient after medical optimization  3. Bipolar I Disorder - [facility] admission 11/15 for mania and delusions. - IVC 06/2018. - Previous psych meds managed by [facility] - Continue risperidone  - PCP or f/u with [facility] for psych issues  4. DM2 - Uncontrolled. - Hgb 11.0 -  Continue SGLT2i, no GU symptoms - follow up with PCP  5. Substance abuse/tobacco use - Started smoking again, 10 cigarettes/ day - chronic  oxy use for back pain, managed by pain clinic - cessation encouraged   6. SDOH - HFSW helping with resources - She has new patient PCP appt 10/16/22, transportation being provided - Needs help with transportation to appts. - Issues with noncompliance however is willing to come into frequent appt to optimize GDMT - food provided - despite [place] providing cell phone, has been very difficult to contact her.   Plan to follow up in AHF every 2 weeks x3 visits. Lots of social issues that need to be tackled during following admissions. Medicines picked up from pharmacy for her. Plan to establish PCP next week, transportation being provided. ***  Brynda Peon, AGACNP-BC  10/23/22  Advanced Heart Failure Team

## 2022-10-24 ENCOUNTER — Telehealth (HOSPITAL_COMMUNITY): Payer: Self-pay | Admitting: Licensed Clinical Social Worker

## 2022-10-24 ENCOUNTER — Inpatient Hospital Stay (HOSPITAL_COMMUNITY)
Admission: RE | Admit: 2022-10-24 | Discharge: 2022-10-24 | Disposition: A | Discharge status: Home / Self Care | Payer: Medicaid Other | Source: Ambulatory Visit

## 2022-10-24 NOTE — Telephone Encounter (Signed)
CSW informed by taxi company that pt not answering her phone to confirm pick up for 1:15pm.  CSW called pt x5 and no answer- texted but no response  Burna Sis, LCSW Clinical Social Worker Advanced [facility] Failure Clinic Desk#: [phone] Cell#: [phone]

## 2022-10-24 NOTE — Telephone Encounter (Signed)
H&V Care Navigation CSW Progress Note  Clinical Social Worker called pt to remind of appt today and inquire if she needs assistance with transportation.  No answer at pt phone and unable to leave VM- sent text message.  Called pt son, Tinnie Gens, who she lives with and he answered- states he has already left for work and has no way to inform her of my call- asked him to please attempt to reach her as well.   SDOH Screenings   Food Insecurity: Food Insecurity Present (09/30/2022)  Housing: High Risk (09/30/2022)  Transportation Needs: Unmet Transportation Needs (10/10/2022)  Utilities: At Risk (09/30/2022)  Alcohol Screen: Low Risk  (12/18/2018)  Depression (PHQ2-9): High Risk (09/30/2022)  Financial Resource Strain: High Risk (09/30/2022)  Tobacco Use: High Risk (10/11/2022)    Burna Sis, LCSW Clinical Social Worker Advanced [facility] Desk#: [phone] Cell#: [phone]

## 2022-10-24 NOTE — Progress Notes (Signed)
Personally called [name] [name] to see if she was able to be picked up by cab. No answer, unable to leave VM.

## 2022-10-24 NOTE — Telephone Encounter (Signed)
H&V Care Navigation CSW Progress Note  Clinical Social Worker called pt back and was able to discuss todays  appt.  She confirms she will need a ride.  Taxi set up for pick up at 1:15pm.  SDOH Screenings   Food Insecurity: Food Insecurity Present (09/30/2022)  Housing: High Risk (09/30/2022)  Transportation Needs: Unmet Transportation Needs (10/10/2022)  Utilities: At Risk (09/30/2022)  Alcohol Screen: Low Risk  (12/18/2018)  Depression (PHQ2-9): High Risk (09/30/2022)  Financial Resource Strain: High Risk (09/30/2022)  Tobacco Use: High Risk (10/11/2022)   10/24/2022  [name] [name] DOB: Oct 25, 1970 MRN: [id number]   RIDER WAIVER AND RELEASE OF LIABILITY  For the purposes of helping with transportation needs, [facility] partners with outside transportation providers (taxi companies, [place], Catering manager.) to give [employer] patients or other approved people the choice of on-demand rides [employer]") to our buildings for non-emergency visits.  By using [employer], I, the person signing this document, on behalf of myself and/or any legal minors (in my care using the [employer]), agree:  Science writer given to me are supplied by independent, outside transportation providers who do not work for, or have any affiliation with, [employer]. [place] is not a transportation company. [place] has no control over the quality or safety of the rides I get using [employer]. [place] has no control over whether any outside ride will happen on time or not. [place] gives no guarantee on the reliability, quality, safety, or availability on any rides, or that no mistakes will happen. I know and accept that traveling by vehicle (car, truck, SVU, Zenaida Niece, bus, taxi, etc.) has risks of serious injuries such as disability, being paralyzed, and death. I know and agree the risk of using [employer] is mine alone, and not [employer]. Transport Services are provided "as is"  and as are available. The transportation providers are in charge for all inspections and care of the vehicles used to provide these rides. I agree not to take legal action against [place], its agents, employees, officers, directors, representatives, insurers, attorneys, assigns, successors, subsidiaries, and affiliates at any time for any reasons related directly or indirectly to using [employer]. I also agree not to take legal action against [place] or its affiliates for any injury, death, or damage to property caused by or related to using [employer]. I have read this Waiver and Release of Liability, and I understand the terms used in it and their legal meaning. This Waiver is freely and voluntarily given with the understanding that my right (or any legal minors) to legal action against [place] relating to [employer] is knowingly given up to use these services.   I attest that I read the Ride Waiver and Release of Liability to [name] [name], gave Ms. Pages the opportunity to ask questions and answered the questions asked (if any). I affirm that [name] [name] then provided consent for assistance with transportation.     Burna Sis

## 2022-10-25 ENCOUNTER — Telehealth (HOSPITAL_COMMUNITY): Payer: Self-pay | Admitting: Licensed Clinical Social Worker

## 2022-10-25 NOTE — Telephone Encounter (Signed)
CSW attempted to contact patient to follow up with missed appointment yesterday. CSW unable to leave message as voicemail not set up. Lasandra Beech, LCSW, CCSW-MCS [phone]

## 2022-10-27 NOTE — Progress Notes (Signed)
No show

## 2022-11-01 ENCOUNTER — Telehealth (HOSPITAL_COMMUNITY): Payer: Self-pay | Admitting: Licensed Clinical Social Worker

## 2022-11-01 NOTE — Telephone Encounter (Signed)
CSW attempted to contact patient as well as all listed contacts on her face sheet with no success. Unable to leave a message on any phone listed in the chart. CSW will attempt again next week as patient has a follow up appointment on Friday Nov 08, 2022 in the clinic. Lasandra Beech, LCSW, CCSW-MCS [phone]

## 2022-11-05 ENCOUNTER — Telehealth (HOSPITAL_COMMUNITY): Payer: Self-pay | Admitting: Licensed Clinical Social Worker

## 2022-11-05 NOTE — Telephone Encounter (Signed)
CSW attempted to contact patient although it appears her phone has run out of minutes. CSW added minutes to patient's phone and received return call form patient. Patient shared that she had to move again as the person whom she was staying with (son's friend) "did not work out". Patient states she was couch surfing at her son's place but now will go to the [facility] where she has been in the past. She shared that she missed an appointment at [facility] and is there now trying to get her benefits resolved as not to lose her income and insurance. Patient states she will be at her appointment on Friday Nov 08, 2022 and asks for transport assistance. CSW will set up taxi for pick up at the [facility] per patient's request. CSW will continue to follow for needs and housing options. Lasandra Beech, LCSW, CCSW-MCS [phone]

## 2022-11-07 ENCOUNTER — Telehealth (HOSPITAL_COMMUNITY): Payer: Self-pay

## 2022-11-07 NOTE — Telephone Encounter (Signed)
Called and was unable to leave patient a voice message to confirm/remind patient of their appointment at the Advanced Heart Failure Clinic on 11/08/22.

## 2022-11-08 ENCOUNTER — Telehealth (HOSPITAL_COMMUNITY): Payer: Self-pay | Admitting: Licensed Clinical Social Worker

## 2022-11-08 ENCOUNTER — Telehealth (HOSPITAL_COMMUNITY): Payer: Self-pay | Admitting: Pharmacy Technician

## 2022-11-08 ENCOUNTER — Other Ambulatory Visit (HOSPITAL_COMMUNITY): Payer: Self-pay

## 2022-11-08 ENCOUNTER — Ambulatory Visit (HOSPITAL_COMMUNITY)
Admission: RE | Admit: 2022-11-08 | Discharge: 2022-11-08 | Disposition: A | Discharge status: Home / Self Care | Payer: BLUE CROSS/BLUE SHIELD | Source: Ambulatory Visit | Attending: Internal Medicine | Admitting: Internal Medicine

## 2022-11-08 ENCOUNTER — Encounter (HOSPITAL_COMMUNITY): Payer: Self-pay

## 2022-11-08 VITALS — BP 90/68 | HR 100 | Wt 166.4 lb

## 2022-11-08 DIAGNOSIS — I5022 Chronic systolic (congestive) heart failure: Secondary | ICD-10-CM | POA: Diagnosis not present

## 2022-11-08 DIAGNOSIS — J441 Chronic obstructive pulmonary disease with (acute) exacerbation: Secondary | ICD-10-CM | POA: Diagnosis not present

## 2022-11-08 DIAGNOSIS — F259 Schizoaffective disorder, unspecified: Secondary | ICD-10-CM | POA: Diagnosis present

## 2022-11-08 DIAGNOSIS — F319 Bipolar disorder, unspecified: Secondary | ICD-10-CM

## 2022-11-08 DIAGNOSIS — F1910 Other psychoactive substance abuse, uncomplicated: Secondary | ICD-10-CM | POA: Diagnosis not present

## 2022-11-08 DIAGNOSIS — Z5982 Transportation insecurity: Secondary | ICD-10-CM | POA: Diagnosis not present

## 2022-11-08 DIAGNOSIS — Z720 Tobacco use: Secondary | ICD-10-CM | POA: Diagnosis not present

## 2022-11-08 DIAGNOSIS — I340 Nonrheumatic mitral (valve) insufficiency: Secondary | ICD-10-CM | POA: Diagnosis not present

## 2022-11-08 DIAGNOSIS — M549 Dorsalgia, unspecified: Secondary | ICD-10-CM | POA: Insufficient documentation

## 2022-11-08 DIAGNOSIS — Z7984 Long term (current) use of oral hypoglycemic drugs: Secondary | ICD-10-CM | POA: Diagnosis not present

## 2022-11-08 DIAGNOSIS — E1165 Type 2 diabetes mellitus with hyperglycemia: Secondary | ICD-10-CM | POA: Diagnosis not present

## 2022-11-08 DIAGNOSIS — Z79899 Other long term (current) drug therapy: Secondary | ICD-10-CM | POA: Diagnosis not present

## 2022-11-08 DIAGNOSIS — E1169 Type 2 diabetes mellitus with other specified complication: Secondary | ICD-10-CM

## 2022-11-08 DIAGNOSIS — R42 Dizziness and giddiness: Secondary | ICD-10-CM | POA: Insufficient documentation

## 2022-11-08 DIAGNOSIS — I428 Other cardiomyopathies: Secondary | ICD-10-CM | POA: Insufficient documentation

## 2022-11-08 DIAGNOSIS — Z139 Encounter for screening, unspecified: Secondary | ICD-10-CM

## 2022-11-08 LAB — BASIC METABOLIC PANEL
Anion gap: 9 (ref 5–15)
BUN: 14 mg/dL (ref 6–20)
CO2: 26 mmol/L (ref 22–32)
Calcium: 8.5 mg/dL — ABNORMAL LOW (ref 8.9–10.3)
Chloride: 102 mmol/L (ref 98–111)
Creatinine, Ser: 0.98 mg/dL (ref 0.44–1.00)
GFR, Estimated: >60 mL/min (ref >60)
Glucose, Bld: 288 mg/dL — ABNORMAL HIGH (ref 70–99)
Potassium: 2.9 mmol/L — ABNORMAL LOW (ref 3.5–5.1)
Sodium: 137 mmol/L (ref 135–145)

## 2022-11-08 LAB — BRAIN NATRIURETIC PEPTIDE: B Natriuretic Peptide: 394.4 pg/mL — ABNORMAL HIGH (ref 0.0–100.0)

## 2022-11-08 NOTE — Progress Notes (Signed)
H&V Care Navigation CSW Progress Note  Clinical Social Worker consulted to help with pt getting to clinic today.  CSW arranged taxi to get patient.  Pt reports current food insecurity- provided with H&V food bag and can opener.  Pt missed last PCP appt- messaged case manager with [employer] to help reschedule.  Pt without her medications.  CSW picked up meds and gave to patient along with samples of jardiance while PA is being done.  Also has another med that won't be ready until Monday as it is out of stock- will discuss with supervisor how to obtain for patient.  Patient is homeless currently staying in a hotel until 6/14.  Working with [facility] staff to call housing but no luck.  Reports she has not worked with [facility] at this time so CSW completed VISDAT and turned in for hopeful case management services.   SDOH Screenings   Food Insecurity: Food Insecurity Present (11/08/2022)  Housing: High Risk (11/08/2022)  Transportation Needs: Unmet Transportation Needs (11/08/2022)  Utilities: At Risk (09/30/2022)  Alcohol Screen: Low Risk  (12/18/2018)  Depression (PHQ2-9): High Risk (09/30/2022)  Financial Resource Strain: High Risk (09/30/2022)  Tobacco Use: High Risk (11/08/2022)   Burna Sis, LCSW Clinical Social Worker Advanced [facility] Failure Clinic Desk#: [phone] Cell#: [phone]

## 2022-11-08 NOTE — Patient Instructions (Addendum)
Thank you for coming in today  If you had labs drawn today, any labs that are abnormal the clinic will call you No news is good news  Medications: Stop Imdur   Do the following things EVERYDAY: Weigh yourself in the morning before breakfast. Write it down and keep it in a log. Take your medicines as prescribed Eat low salt foods--Limit salt (sodium) to 2000 mg per day.  Stay as active as you can everyday Limit all fluids for the day to less than 2 liters   At the Advanced [facility] Clinic, you and your health needs are our priority. As part of our continuing mission to provide you with exceptional heart care, we have created designated Provider Care Teams. These Care Teams include your primary Cardiologist (physician) and Advanced Practice Providers (APPs- Physician Assistants and Nurse Practitioners) who all work together to provide you with the care you need, when you need it.   You may see any of the following providers on your designated Care Team at your next follow up: Dr Arvilla Meres Dr Marca Ancona Dr. Marcos Eke, NP Robbie Lis, [place] [facility] [place], [place] Brynda Peon, NP Karle Plumber, PharmD   Please be sure to bring in all your medications bottles to every appointment.    Thank you for choosing [facility]  If you have any questions or concerns before your next appointment please send [place] a message through [place] or call our office at [phone].    TO LEAVE A MESSAGE FOR THE NURSE SELECT OPTION 2, PLEASE LEAVE A MESSAGE INCLUDING: YOUR NAME DATE OF BIRTH CALL BACK NUMBER REASON FOR CALL**this is important as we prioritize the call backs  YOU WILL RECEIVE A CALL BACK THE SAME DAY AS LONG AS YOU CALL BEFORE 4:00 PM

## 2022-11-08 NOTE — Telephone Encounter (Addendum)
CSW contacted patient to make transport arrangements to clinic appointment today. Patient states she got paid today and is moving to a motel. She states she will call CSW back shortly with the address for pick up. Lasandra Beech, LCSW, CCSW-MCS [phone]

## 2022-11-08 NOTE — Progress Notes (Signed)
ADVANCED HF CLINIC NOTE   PCP: Gwinda Passe, NP Primary Cardiologist: Jodelle Red, MD HF Cardiologist: Dr. Gasper Lloyd  HPI: [name] [name] is a 52 y.o. female with nonischemic cardiomyopathy (LVEF 20%), severe MR, schizoaffective disorder.  [name] [name] cardiac history dates back to spring of 2022 when she was originally diagnosed with HFrEF. She has previously been seen at [facility] where she has multiple hospital admissions over the past 1 year. She was previously followed by the advanced heart failure team at [facility] where she was not deemed a candidate for advanced therapies due to ongoing smoking, psychosocial barriers, schizoaffective disorder and chronic pain disorder. In July 2023 she was asdmitted to the ICU there for cardiogenic shock requiring inotropes/pressors and aggressive diuresis. Hospice was suggested, however, patient states she is unable to receive hospice support because her daughter does not want nurses in their home.   Admitted 12/23 with a/c systolic HF. Diuresed with IV lasix. Echo showed EF < 20%, RV mildly reduced, severe centrally directed MR. Felt to have end-stage CM. GMT titrated, and she was discharged home, weight 172 lbs. She declined follow up with AHF.  Admitted 2/24 with a/c systolic HF and mild COPD exacerbation. Diuresed with IV lasix. Echo showed EF < 20%, RV ok, moderate MR. Hospitalization complicate by AKI. Continued on metoprolol, Imdur, and Bumex at discharge, weight 185 lbs.  Treated in ED 08/19/22 with AECOPD. Seen in ED 08/20/22 with CP. HsTroponins flat. Deemed stable for discharge.  Admitted 08/21/22 with hyperglycemia and AKI. Admitted 09/01/22 with nSTEMI. Started on IV heparin. Echo showed EF 20-25%, RV ok, no WMAs. With no CP and CBGs>500, LHC deferred. She required IV insulin to control her blood glucose. She was discharged home, weight 184 lbs.  Several ED visits for hyperglycemia.  Admitted 4/24  with AECOPD after smoking THC from new dealer. Failed BiPap and required intubation. Had R pneumo and required placement of chest tube. Eventually extubated, drips weaned and discharged home, weight 185 lbs.  Today she returns for AHF follow up, missed 2 wk visit. Overall feeling ok. Denies palpitations, CP, edema, or PND/Orthopnea. Some SOB that started today. Was staying at [facility], now in motel (got 900$ monthly SSI check recently, staying at [facility] for the next 2 weeks). Gets dizzy spells 3-4 times/day. Appetite ok, loves to eat but has been struggling to get food. No fever or chills. Lost scale. Taking all medications as able, misses doses, some pill bottles unlabeled and she doesn't know what they are. Doesn't like fluid pill at night because sometimes bathrooms are too far. Now smoking 1 PPD cigarettes. No alcohol use.    Past Medical History:  Diagnosis Date   Anxiety disorder    Asthma    Bipolar affective disorder (HCC)    Blood transfusion without reported diagnosis    CHF (congestive heart failure) (HCC)    COPD (chronic obstructive pulmonary disease) (HCC)    Depression    Diabetes mellitus    Migraine    Schizophrenia (HCC)    Current Outpatient Medications  Medication Sig Dispense Refill   acetaminophen (TYLENOL) 325 MG tablet Take 1 tablet (325 mg total) by mouth every 6 (six) hours as needed for headache or mild pain. 60 tablet 0   albuterol (VENTOLIN HFA) 108 (90 Base) MCG/ACT inhaler Inhale 2 puffs into the lungs every 6 (six) hours as needed for wheezing or shortness of breath. 18 g 2   ASPIRIN LOW DOSE 81 MG  tablet Take 1 tablet (81 mg total) by mouth daily. 30 tablet 8   blood glucose meter kit and supplies Dispense based on patient and insurance preference. Use up to four times daily as directed. (FOR ICD-10 E10.9, E11.9). 1 each 0   bumetanide (BUMEX) 2 MG tablet Take 1 tablet (2 mg total) by mouth 2 (two) times daily. 60 tablet 8   Cholecalciferol (VITAMIN D3) 50 MCG  (2000 UT) CAPS Patient takes 1 tablet by mouth once a week on Fridays.     empagliflozin (JARDIANCE) 10 MG TABS tablet Take 1 tablet (10 mg total) by mouth daily. 30 tablet 8   insulin glargine, 1 Unit Dial, (TOUJEO SOLOSTAR) 300 UNIT/ML Solostar Pen Inject 24 Units into the skin at bedtime. 3 mL 0   Insulin Pen Needle 32G X 4 MM MISC Use As Directed 100 each 0   ipratropium-albuterol (DUONEB) 0.5-2.5 (3) MG/3ML SOLN inhale 3 mLs by nebulization every 6 (six) hours as needed. 360 mL 0   isosorbide mononitrate (IMDUR) 30 MG 24 hr tablet Take 1 tablet (30 mg total) by mouth daily. 30 tablet 8   losartan (COZAAR) 25 MG tablet Take 1/2 tablet (12.5 mg total) by mouth daily. 15 tablet 8   metoprolol succinate (TOPROL-XL) 25 MG 24 hr tablet Take 0.5 tablets (12.5 mg total) by mouth daily. 15 tablet 8   mometasone-formoterol (DULERA) 200-5 MCG/ACT AERO Inhale 2 puffs into the lungs 2 (two) times daily. 13 g 0   nitroGLYCERIN (NITROSTAT) 0.4 MG SL tablet Place 1 tablet (0.4 mg total) under the tongue every 5 (five) minutes as needed for chest pain. 50 tablet 0   oxyCODONE-acetaminophen (PERCOCET) 10-325 MG tablet Take 1 tablet by mouth every 6 (six) hours as needed for pain. 15 tablet 0   potassium chloride (KLOR-CON M) 10 MEQ tablet Take 1 tablet (10 mEq total) by mouth daily. 30 tablet 8   risperiDONE (RISPERDAL) 2 MG tablet Take 1/2 tablet in the morning and one tablet in the evening. 45 tablet 0   rosuvastatin (CRESTOR) 20 MG tablet Take 1 tablet (20 mg total) by mouth daily. 30 tablet 8   methimazole (TAPAZOLE) 5 MG tablet Take 1 tablet (5 mg total) by mouth daily. (Patient not taking: Reported on 11/08/2022) 30 tablet 0   spironolactone (ALDACTONE) 25 MG tablet Take 0.5 tablets (12.5 mg total) by mouth daily. (Patient not taking: Reported on 11/08/2022) 15 tablet 0   No current facility-administered medications for this encounter.   Allergies  Allergen Reactions   Tramadol Nausea Only and Rash    Benadryl [Diphenhydramine]     Decreases motor skills and increases anxiety   Lactose Intolerance (Gi)    Wellbutrin [Bupropion] Other (See Comments)    rage   Nicotine Rash    Patient states she is allergic to the Nicotine patch and they cause her to break out in a rash.  She states she can smoke cigarettes.   Social History   Socioeconomic History   Marital status: Divorced    Spouse name: Not on file   Number of children: Not on file   Years of education: Not on file   Highest education level: Not on file  Occupational History   Not on file  Tobacco Use   Smoking status: Every Day    Packs/day: 1.00    Years: 29.00    Additional pack years: 0.00    Total pack years: 29.00    Types: Cigarettes   Smokeless tobacco:  Never  Vaping Use   Vaping Use: Former  Substance and Sexual Activity   Alcohol use: Not Currently    Comment: occasionally   Drug use: No   Sexual activity: Yes    Birth control/protection: Surgical  Other Topics Concern   Not on file  Social History Narrative   Patient lives in [place] with her 2 kids- 34 in 76 years old.   She has total 4 kids.   She is a single mom.         Social Determinants of Health   Financial Resource Strain: High Risk (09/30/2022)   Overall Financial Resource Strain (CARDIA)    Difficulty of Paying Living Expenses: Hard  Food Insecurity: Food Insecurity Present (09/30/2022)   Hunger Vital Sign    Worried About Running Out of Food in the Last Year: Often true    Ran Out of Food in the Last Year: Often true  Transportation Needs: Unmet Transportation Needs (11/08/2022)   PRAPARE - Administrator, Civil Service (Medical): Yes    Lack of Transportation (Non-Medical): Yes  Physical Activity: Not on file  Stress: Not on file  Social Connections: Not on file  Intimate Partner Violence: Not At Risk (09/17/2022)   Humiliation, Afraid, Rape, and Kick questionnaire    Fear of Current or Ex-Partner: No    Emotionally  Abused: No    Physically Abused: No    Sexually Abused: No    Family History  Problem Relation Age of Onset   Bipolar disorder Mother    Hypertension Father    Diabetes Father     Vitals:   11/08/22 1406  BP: 90/68  Pulse: 100  SpO2: 97%  Weight: 75.5 kg (166 lb 6.4 oz)   PHYSICAL EXAM: General:  disheveled appearing.  No respiratory difficulty HEENT: normal Neck: supple. JVD ~7 cm. Carotids 2+ bilat; no bruits. No lymphadenopathy or thyromegaly appreciated. Cor: PMI nondisplaced. Regular rate & rhythm. No rubs, gallops or murmurs. Lungs: wheezy throughout Abdomen: soft, nontender, nondistended. No hepatosplenomegaly. No bruits or masses. Good bowel sounds. Extremities: no cyanosis, clubbing, rash, edema  Neuro: alert & oriented x 3, cranial nerves grossly intact. moves all 4 extremities w/o difficulty. Affect pleasant.   Wt Readings from Last 3 Encounters:  11/08/22 75.5 kg (166 lb 6.4 oz)  10/11/22 76.7 kg (169 lb 3.2 oz)  09/30/22 84.1 kg (185 lb 4.8 oz)   EKG: None today  ASSESSMENT & PLAN: Chronic Systolic Heart Failure, NiCM - As noted above, extensive evaluation at [facility]; patient not deemed a candidate for advanced therapies due to psychosocial barriers including continued tobacco use, lack of social support, possible schizoaffective disorder; was discharged on hospice, however, patient states daughter would not want nursing support in her home. - Echo (12/23):  EF < 20%, RV mildly reduced, severe centrally directed MR - limited echo (3/24): EF 20-25%, RV ok, severe functional MR - NYHA II- early III - Continue bumex 2 mg BID with 10 mEq KDUR daily. Volume appears stable.  - Continue Jardiance 10 mg daily - Stop Imdur with frequent dizziness.  - Continue metoprolol succinate 12.5 - Continue spironolactone 25 mg daily - Encouraged to take meds as prescribed and not skip any doses.  - BMET, BNP today   2. Severe functional MR - Severe centrally directed MR on  TTE - Diurese and afterload reduction.  - Would be good candidate for TEER; will follow up outpatient after medical optimization.   3. Bipolar I  Disorder - [facility] admission 11/15 for mania and delusions. - IVC 06/2018. - Previous psych meds managed by [facility] - Continue risperidone  - PCP or f/u with [facility] for psych issues  4. DM2 - Uncontrolled. - Hgb A1c 11.0 - Continue SGLT2i, no GU symptoms - needs to follow up with PCP  5. Substance abuse/tobacco use - Started smoking again, now up to 1 PPD - chronic oxy use for back pain, managed by pain clinic - cessation encouraged   6. SDOH - HFSW helping with resources. HFSW spent a good amount of time with patient today ensuring she got her medications, transportation, and establishing PCP, among other things.  - food provided  - concerned about making it to her next 2 week appointment as its on the day she would have to leave motel, discussed with HFSW.   Continue 2 week f/u for now as scheduled.   Brynda Peon, AGACNP-BC  11/08/22  Advanced Heart Failure Clinic [facility] [address] [facility] [place] [address] [phone] (office) [phone] (fax)

## 2022-11-08 NOTE — Telephone Encounter (Signed)
Patient Advocate Encounter   Received notification from Medicaid that prior authorization for Jardiance is required.   PA submitted on [employer] Key [id number] W Status is pending   Will continue to follow.

## 2022-11-09 ENCOUNTER — Other Ambulatory Visit: Payer: Self-pay

## 2022-11-11 ENCOUNTER — Other Ambulatory Visit (HOSPITAL_COMMUNITY): Payer: Self-pay

## 2022-11-11 NOTE — Telephone Encounter (Signed)
Advanced Heart Failure Patient Advocate Encounter  Prior Authorization for [place] has been approved.    PA#  [id number] Effective dates: 11/09/22 through 11/09/23  Patients co-pay is $4  Archer Asa, CPhT

## 2022-11-12 ENCOUNTER — Telehealth: Payer: Self-pay

## 2022-11-12 NOTE — Telephone Encounter (Signed)
I attempted to contact the patient [phone]  to schedule a ride to her upcoming appointment at [facility] - 11/14/2022 but I was unable to reach her. The recording stated that the call cannot be completed as dialed.

## 2022-11-14 ENCOUNTER — Ambulatory Visit (INDEPENDENT_AMBULATORY_CARE_PROVIDER_SITE_OTHER): Payer: BLUE CROSS/BLUE SHIELD | Admitting: Primary Care

## 2022-11-18 NOTE — Telephone Encounter (Signed)
I tried to contact patient again to schedule a ride to her upcoming appointment: I called [phone] and the voicemail is not set up.  I also called  [phone] and the recording stated that the call cannot be completed at dialed

## 2022-11-19 ENCOUNTER — Telehealth: Payer: Self-pay

## 2022-11-19 NOTE — Telephone Encounter (Signed)
Called to sdpeak with pt about apt but no answer and could not leave VM

## 2022-11-19 NOTE — Telephone Encounter (Signed)
I tried to contact patient again to schedule a ride to her appointment tomorrow at [facility].  I called [phone] and the voicemail is not set up.  I also called  [phone] and the recording stated that the call cannot be completed at this time.

## 2022-11-20 ENCOUNTER — Telehealth (HOSPITAL_COMMUNITY): Payer: Self-pay

## 2022-11-20 ENCOUNTER — Ambulatory Visit (INDEPENDENT_AMBULATORY_CARE_PROVIDER_SITE_OTHER): Payer: BLUE CROSS/BLUE SHIELD | Admitting: Primary Care

## 2022-11-20 NOTE — Telephone Encounter (Signed)
Called to confirm/remind patient of their appointment at the Advanced [facility] Failure Clinic on 11/21/22. However, I tried calling both of patient's phone numbers and was unable to leave a detailed voice message on either of her phone lines.

## 2022-11-21 ENCOUNTER — Telehealth (HOSPITAL_COMMUNITY): Payer: Self-pay

## 2022-11-21 ENCOUNTER — Encounter (HOSPITAL_COMMUNITY): Payer: Medicaid Other

## 2022-11-21 ENCOUNTER — Other Ambulatory Visit (HOSPITAL_COMMUNITY): Payer: Self-pay

## 2022-11-21 NOTE — Telephone Encounter (Addendum)
Called both patient's phone numbers and was unable to leave a voice message to confirm/remind patient of their appointment at the Advanced [facility] Failure Clinic on 11/21/22

## 2022-11-22 ENCOUNTER — Encounter (HOSPITAL_COMMUNITY): Payer: Medicaid Other

## 2022-11-25 ENCOUNTER — Other Ambulatory Visit (HOSPITAL_COMMUNITY): Payer: Self-pay

## 2022-12-14 ENCOUNTER — Other Ambulatory Visit: Payer: Self-pay

## 2022-12-14 ENCOUNTER — Emergency Department (HOSPITAL_COMMUNITY)
Admission: EM | Admit: 2022-12-14 | Discharge: 2022-12-14 | Disposition: A | Discharge status: Home / Self Care | Payer: BLUE CROSS/BLUE SHIELD | Attending: Emergency Medicine | Admitting: Emergency Medicine

## 2022-12-14 ENCOUNTER — Encounter (HOSPITAL_COMMUNITY): Payer: Self-pay

## 2022-12-14 ENCOUNTER — Emergency Department (HOSPITAL_COMMUNITY): Payer: BLUE CROSS/BLUE SHIELD

## 2022-12-14 DIAGNOSIS — R0602 Shortness of breath: Secondary | ICD-10-CM | POA: Insufficient documentation

## 2022-12-14 DIAGNOSIS — E119 Type 2 diabetes mellitus without complications: Secondary | ICD-10-CM | POA: Insufficient documentation

## 2022-12-14 DIAGNOSIS — I509 Heart failure, unspecified: Secondary | ICD-10-CM | POA: Insufficient documentation

## 2022-12-14 DIAGNOSIS — Z7982 Long term (current) use of aspirin: Secondary | ICD-10-CM | POA: Insufficient documentation

## 2022-12-14 DIAGNOSIS — J449 Chronic obstructive pulmonary disease, unspecified: Secondary | ICD-10-CM | POA: Insufficient documentation

## 2022-12-14 DIAGNOSIS — Z7984 Long term (current) use of oral hypoglycemic drugs: Secondary | ICD-10-CM | POA: Insufficient documentation

## 2022-12-14 DIAGNOSIS — Z794 Long term (current) use of insulin: Secondary | ICD-10-CM | POA: Insufficient documentation

## 2022-12-14 DIAGNOSIS — R079 Chest pain, unspecified: Secondary | ICD-10-CM | POA: Diagnosis present

## 2022-12-14 LAB — COMPREHENSIVE METABOLIC PANEL
ALT: 9 U/L (ref 0–44)
AST: 10 U/L — ABNORMAL LOW (ref 15–41)
Albumin: 3.4 g/dL — ABNORMAL LOW (ref 3.5–5.0)
Alkaline Phosphatase: 96 U/L (ref 38–126)
Anion gap: 13 (ref 5–15)
BUN: 16 mg/dL (ref 6–20)
CO2: 22 mmol/L (ref 22–32)
Calcium: 8.6 mg/dL — ABNORMAL LOW (ref 8.9–10.3)
Chloride: 109 mmol/L (ref 98–111)
Creatinine, Ser: 0.85 mg/dL (ref 0.44–1.00)
GFR, Estimated: >60 mL/min (ref >60)
Glucose, Bld: 166 mg/dL — ABNORMAL HIGH (ref 70–99)
Potassium: 3.7 mmol/L (ref 3.5–5.1)
Sodium: 144 mmol/L (ref 135–145)
Total Bilirubin: 0.5 mg/dL (ref 0.3–1.2)
Total Protein: 5.6 g/dL — ABNORMAL LOW (ref 6.5–8.1)

## 2022-12-14 LAB — CBC WITH DIFFERENTIAL/PLATELET
Abs Immature Granulocytes: 0.01 10*3/uL (ref 0.00–0.07)
Basophils Absolute: 0 10*3/uL (ref 0.0–0.1)
Basophils Relative: 1 %
Eosinophils Absolute: 0.1 10*3/uL (ref 0.0–0.5)
Eosinophils Relative: 2 %
HCT: 40 % (ref 36.0–46.0)
Hemoglobin: 12.9 g/dL (ref 12.0–15.0)
Immature Granulocytes: 0 %
Lymphocytes Relative: 24 %
Lymphs Abs: 1.2 10*3/uL (ref 0.7–4.0)
MCH: 27.7 pg (ref 26.0–34.0)
MCHC: 32.3 g/dL (ref 30.0–36.0)
MCV: 86 fL (ref 80.0–100.0)
Monocytes Absolute: 0.3 10*3/uL (ref 0.1–1.0)
Monocytes Relative: 7 %
Neutro Abs: 3.2 10*3/uL (ref 1.7–7.7)
Neutrophils Relative %: 66 %
Platelets: 151 10*3/uL (ref 150–400)
RBC: 4.65 MIL/uL (ref 3.87–5.11)
RDW: 14.8 % (ref 11.5–15.5)
WBC: 4.8 10*3/uL (ref 4.0–10.5)
nRBC: 0 % (ref 0.0–0.2)

## 2022-12-14 LAB — TROPONIN I (HIGH SENSITIVITY)
Troponin I (High Sensitivity): 15 ng/L (ref <18)
Troponin I (High Sensitivity): 18 ng/L — ABNORMAL HIGH (ref <18)

## 2022-12-14 LAB — BRAIN NATRIURETIC PEPTIDE: B Natriuretic Peptide: 533 pg/mL — ABNORMAL HIGH (ref 0.0–100.0)

## 2022-12-14 MED ORDER — IOHEXOL 350 MG/ML SOLN
100.0000 mL | Freq: Once | INTRAVENOUS | Status: AC | PRN
  Administered 2022-12-14: 100 mL via INTRAVENOUS

## 2022-12-14 MED ORDER — MORPHINE SULFATE (PF) 2 MG/ML IV SOLN
2.0000 mg | Freq: Once | INTRAVENOUS | Status: AC
  Administered 2022-12-14: 2 mg via INTRAVENOUS
  Filled 2022-12-14: qty 1

## 2022-12-14 MED ORDER — ONDANSETRON HCL 4 MG/2ML IJ SOLN
4.0000 mg | Freq: Once | INTRAMUSCULAR | Status: AC
  Administered 2022-12-14: 4 mg via INTRAVENOUS
  Filled 2022-12-14: qty 2

## 2022-12-14 MED ORDER — PROCHLORPERAZINE EDISYLATE 10 MG/2ML IJ SOLN
5.0000 mg | Freq: Once | INTRAMUSCULAR | Status: AC
  Administered 2022-12-14: 5 mg via INTRAVENOUS
  Filled 2022-12-14: qty 2

## 2022-12-14 MED ORDER — HYDROMORPHONE HCL 1 MG/ML IJ SOLN
1.0000 mg | Freq: Once | INTRAMUSCULAR | Status: AC
  Administered 2022-12-14: 1 mg via INTRAVENOUS
  Filled 2022-12-14: qty 1

## 2022-12-14 MED ORDER — IPRATROPIUM-ALBUTEROL 0.5-2.5 (3) MG/3ML IN SOLN
3.0000 mL | Freq: Once | RESPIRATORY_TRACT | Status: AC
  Administered 2022-12-14: 3 mL via RESPIRATORY_TRACT
  Filled 2022-12-14: qty 3

## 2022-12-14 MED ORDER — LORAZEPAM 2 MG/ML IJ SOLN
1.0000 mg | Freq: Once | INTRAMUSCULAR | Status: DC
  Filled 2022-12-14: qty 1

## 2022-12-14 MED ORDER — LIDOCAINE 5 % EX PTCH
1.0000 | MEDICATED_PATCH | Freq: Once | CUTANEOUS | Status: DC
  Administered 2022-12-14: 1 via TRANSDERMAL
  Filled 2022-12-14: qty 1

## 2022-12-14 NOTE — ED Triage Notes (Signed)
Pt started w/ severe CP around 0900, took a nitroglycerin at home. Given 324 ASA and 2 more nitroglycerin w/ EMS. Pain unrelieved by meds, pt feeling sob. ICU w/ intubation recently in April

## 2022-12-14 NOTE — ED Notes (Signed)
Patient transported to CT 

## 2022-12-14 NOTE — ED Provider Notes (Signed)
[place] EMERGENCY DEPARTMENT AT [facility] Provider Note   CSN: [id number] Arrival date & time: 12/14/22  1302     History  Chief Complaint  Patient presents with   Chest Pain   Shortness of Breath    [name] [name] is a 52 y.o. female.  HPI 52 year old female with history of DM type II, anxiety, depression, schizophrenia, CHF with an EF of 20 to 25% as of March 2024, COPD with a recent admission last month for COPD exacerbation quiring intubation presents to the ER with sudden onset of left-sided chest pain which radiates to the left arm and occasionally down to her bellybutton which began this morning.  Pain is a 9 out of 10.  She took Tylenol, nitroglycerin with little relief.  She got additional nitroglycerin and aspirin with EMS which also did not improve her pain.  Denies any dizziness or near syncope.  Also endorses some nausea but no vomiting.  She was admitted for an NSTEMI back in March of this year, however given she was asymptomatic a left heart cath was deferred at that time.  She denies any recent travel, denies any lower extremity edema.    Home Medications Prior to Admission medications   Medication Sig Start Date End Date Taking? Authorizing Provider  acetaminophen (TYLENOL) 325 MG tablet Take 1 tablet (325 mg total) by mouth every 6 (six) hours as needed for headache or mild pain. 09/27/22   Arrien, York Ram, MD  albuterol (VENTOLIN HFA) 108 (90 Base) MCG/ACT inhaler Inhale 2 puffs into the lungs every 6 (six) hours as needed for wheezing or shortness of breath. 09/27/22   Arrien, York Ram, MD  ASPIRIN LOW DOSE 81 MG tablet Take 1 tablet (81 mg total) by mouth daily. 10/11/22   Alen Bleacher, NP  blood glucose meter kit and supplies Dispense based on patient and insurance preference. Use up to four times daily as directed. (FOR ICD-10 E10.9, E11.9). 01/06/18   Rodolph Bong, MD  bumetanide (BUMEX) 2 MG tablet Take 1 tablet (2 mg total) by  mouth 2 (two) times daily. 10/11/22 07/08/23  Alen Bleacher, NP  Cholecalciferol (VITAMIN D3) 50 MCG (2000 UT) CAPS Patient takes 1 tablet by mouth once a week on Fridays.    [provider]  empagliflozin (JARDIANCE) 10 MG TABS tablet Take 1 tablet (10 mg total) by mouth daily. 10/11/22   Alen Bleacher, NP  insulin glargine, 1 Unit Dial, (TOUJEO SOLOSTAR) 300 UNIT/ML Solostar Pen Inject 24 Units into the skin at bedtime. 10/11/22 11/10/22  Alen Bleacher, NP  Insulin Pen Needle 32G X 4 MM MISC Use As Directed 09/30/22   Arrien, York Ram, MD  ipratropium-albuterol (DUONEB) 0.5-2.5 (3) MG/3ML SOLN inhale 3 mLs by nebulization every 6 (six) hours as needed. 10/11/22   Alen Bleacher, NP  losartan (COZAAR) 25 MG tablet Take 1/2 tablet (12.5 mg total) by mouth daily. 10/11/22 01/09/23  Alen Bleacher, NP  methimazole (TAPAZOLE) 5 MG tablet Take 1 tablet (5 mg total) by mouth daily. Patient not taking: Reported on 11/08/2022 10/11/22 12/08/22  Alen Bleacher, NP  metoprolol succinate (TOPROL-XL) 25 MG 24 hr tablet Take 1/2 tablet (12.5 mg total) by mouth daily. 10/11/22 07/08/23  Alen Bleacher, NP  mometasone-formoterol (DULERA) 200-5 MCG/ACT AERO Inhale 2 puffs into the lungs 2 (two) times daily. 09/27/22   Arrien, York Ram, MD  nitroGLYCERIN (NITROSTAT) 0.4 MG SL tablet Place 1 tablet (0.4 mg  total) under the tongue every 5 (five) minutes as needed for chest pain. 09/27/22   Arrien, York Ram, MD  oxyCODONE-acetaminophen (PERCOCET) 10-325 MG tablet Take 1 tablet by mouth every 6 (six) hours as needed for pain. 09/02/22   Wouk, Wilfred Curtis, MD  potassium chloride (KLOR-CON M) 10 MEQ tablet Take 1 tablet (10 mEq total) by mouth daily. 10/11/22 07/08/23  Alen Bleacher, NP  risperiDONE (RISPERDAL) 2 MG tablet Take 1/2 tablet in the morning and one tablet in the evening. 10/11/22   Alen Bleacher, NP  rosuvastatin (CRESTOR) 20 MG tablet Take 1 tablet (20 mg total) by mouth daily. 10/11/22 11/10/22  Alen Bleacher, NP   spironolactone (ALDACTONE) 25 MG tablet Take 0.5 tablets (12.5 mg total) by mouth daily. Patient not taking: Reported on 11/08/2022 09/27/22 10/27/22  Arrien, York Ram, MD  benztropine (COGENTIN) 1 MG tablet Take 1 tablet (1 mg total) by mouth 2 (two) times daily. 12/23/18 06/04/20  Malvin Johns, MD  insulin aspart (NOVOLOG) 100 UNIT/ML injection Inject 0-9 Units into the skin 3 (three) times daily with meals. Patient not taking: Reported on 06/03/2020 12/16/18 06/04/20  Lanae Boast, MD      Allergies    Tramadol, Benadryl [diphenhydramine], Lactose intolerance (gi), Wellbutrin [bupropion], and Nicotine    Review of Systems   Review of Systems Ten systems reviewed and are negative for acute change, except as noted in the HPI.   Physical Exam Updated Vital Signs BP 124/77   Pulse 79   Temp 98.2 F (36.8 C) (Oral)   Resp 17   Ht 5\' 5"  (1.651 m)   Wt 72.6 kg   SpO2 93%   BMI 26.63 kg/m  Physical Exam Vitals and nursing note reviewed.  Constitutional:      General: She is not in acute distress.    Appearance: She is well-developed.  HENT:     Head: Normocephalic and atraumatic.  Eyes:     Conjunctiva/sclera: Conjunctivae normal.  Cardiovascular:     Rate and Rhythm: Normal rate and regular rhythm.     Heart sounds: No murmur heard. Pulmonary:     Effort: Pulmonary effort is normal. No respiratory distress.     Breath sounds: Normal breath sounds.  Abdominal:     Palpations: Abdomen is soft.     Tenderness: There is no abdominal tenderness.  Musculoskeletal:        General: No swelling.     Cervical back: Neck supple.     Right lower leg: No edema.     Left lower leg: No edema.  Skin:    General: Skin is warm and dry.     Capillary Refill: Capillary refill takes less than 2 seconds.  Neurological:     Mental Status: She is alert.  Psychiatric:        Mood and Affect: Mood normal.     ED Results / Procedures / Treatments   Labs (all labs ordered are listed,  but only abnormal results are displayed) Labs Reviewed  COMPREHENSIVE METABOLIC PANEL - Abnormal; Notable for the following components:      Result Value   Glucose, Bld 166 (*)    Calcium 8.6 (*)    Total Protein 5.6 (*)    Albumin 3.4 (*)    AST 10 (*)    All other components within normal limits  BRAIN NATRIURETIC PEPTIDE - Abnormal; Notable for the following components:   B Natriuretic Peptide 533.0 (*)    All other  components within normal limits  CBC WITH DIFFERENTIAL/PLATELET  RAPID URINE DRUG SCREEN, HOSP PERFORMED  TROPONIN I (HIGH SENSITIVITY)  TROPONIN I (HIGH SENSITIVITY)    EKG EKG Interpretation Date/Time:  Saturday December 14 2022 13:09:30 EDT Ventricular Rate:  87 PR Interval:  176 QRS Duration:  103 QT Interval:  407 QTC Calculation: 490 R Axis:   7  Text Interpretation: Sinus rhythm Left ventricular hypertrophy Nonspecific T abnormalities, lateral leads Borderline prolonged QT interval No significant change since last tracing Confirmed by Linwood Dibbles [phone]) on 12/14/2022 1:36:32 PM  Radiology DG Chest Portable 1 View  Result Date: 12/14/2022 CLINICAL DATA:  Chest pain EXAM: PORTABLE CHEST 1 VIEW COMPARISON:  None Available. FINDINGS: Normal mediastinum and cardiac silhouette. Normal pulmonary vasculature. No evidence of effusion, infiltrate, or pneumothorax. No acute bony abnormality. IMPRESSION: No acute cardiopulmonary process. Electronically Signed   By: Genevive Bi M.D.   On: 12/14/2022 14:19    Procedures Procedures    Medications Ordered in ED Medications  morphine (PF) 2 MG/ML injection 2 mg (2 mg Intravenous Given 12/14/22 1351)  ondansetron (ZOFRAN) injection 4 mg (4 mg Intravenous Given 12/14/22 1351)  HYDROmorphone (DILAUDID) injection 1 mg (1 mg Intravenous Given 12/14/22 1442)    ED Course/ Medical Decision Making/ A&P                             Medical Decision Making Amount and/or Complexity of Data Reviewed Labs: ordered. Radiology:  ordered.  Risk Prescription drug management.  52 year old female presenting with left-sided chest pain radiating to the left arm and to her bellybutton.  On arrival, she looks uncomfortable but is nontoxic, in no acute distress.  Lungs slightly diminished but she does not sound overly wheezy, not suggestive of COPD exacerbation.  She is not hypoxic or tachycardic, she is afebrile and blood pressure is stable.  DDX is broad but includes ACS, PE, dissection, CHF exacerbation, anxiety, GERD, pneumothorax, pericardial effusion  Labs ordered, reviewed, interpreted by me.  CBC unremarkable, CMP without any electrode abnormalities, normal hepatic function panel, initial troponin 15.  BNP elevated at 533 but not much significantly more than her baseline.   EKG reviewed, no significant ischemic changes, normal sinus rhythm, with borderline QTc prolongation.  Chest x-ray ordered, reviewed, agree with radiology read, negative for acute findings.  Patient received 2 mg of morphine and Zofran with no resolution in her pain in addition to the medication she received prior to presentation to the ER.  With this, we will proceed forward with CT angio chest abdomen pelvis to rule out dissection/PE.  Awaiting second troponin.  Likely will need to touch base with cardiology given she had a history of an NSTEMI but left heart cath deferred at the time given no chest pain.  Will give additional milligram of Dilaudid. Care signed out to oncoming team who will oversee her workup an disposition appropriately    Final Clinical Impression(s) / ED Diagnoses Final diagnoses:  Chest pain, unspecified type    Rx / DC Orders ED Discharge Orders     None         Mare Ferrari, PA-C 12/14/22 1457    Linwood Dibbles, MD 12/15/22 [phone]

## 2022-12-14 NOTE — ED Provider Notes (Signed)
Physical Exam  BP 124/77   Pulse 79   Temp 98.2 F (36.8 C) (Oral)   Resp 17   Ht 5\' 5"  (1.651 m)   Wt 72.6 kg   SpO2 93%   BMI 26.63 kg/m   Physical Exam Vitals and nursing note reviewed.  Constitutional:      General: She is not in acute distress.    Appearance: Normal appearance.  HENT:     Head: Normocephalic and atraumatic.     Mouth/Throat:     Mouth: Mucous membranes are moist.  Eyes:     Conjunctiva/sclera: Conjunctivae normal.     Pupils: Pupils are equal, round, and reactive to light.  Cardiovascular:     Rate and Rhythm: Normal rate and regular rhythm.     Pulses: Normal pulses.     Heart sounds: Normal heart sounds.  Pulmonary:     Effort: Pulmonary effort is normal.     Breath sounds: Wheezing present.     Comments: Wheezing present in the middle and lower lobes of the right lung Abdominal:     Palpations: Abdomen is soft.     Tenderness: There is no abdominal tenderness.  Musculoskeletal:     Right lower leg: Edema present.     Left lower leg: Edema present.  Skin:    General: Skin is warm and dry.     Findings: No rash.  Neurological:     General: No focal deficit present.     Mental Status: She is alert.  Psychiatric:        Mood and Affect: Mood normal.        Behavior: Behavior normal.     Procedures  Procedures: not indicated.  ED Course / MDM    Medical Decision Making Amount and/or Complexity of Data Reviewed Labs: ordered. Radiology: ordered.  Risk Prescription drug management.   I received this patient at the time of shift change.  She is a 52 year old female with a history of NSTEMI, CHF with an EF of 20-25%, and COPD who presents the ED today with shortness of breath and chest pain.  Her symptoms started this morning with chest pain located at the center of her chest that radiates down to her stomach and back pain, which she unsure if it is related to the chest pain.  She has taken nitroglycerin without any relief.  She also  endorses left arm numbness and tingling.  No aggravating or alleviating factors.  Denies recent travel or shortness of breath.  DuoNeb was ordered. Lungs are CTAB on reevaluation. At this time, she still reported chest pain and nausea. I ordered her Dilaudid and Compazine.  Patient's nurse reported that her oxygen saturation was sporadically dropping into the 80s, so she was put on 2L nasal cannula. Patient denied feeling short of breath at this time.  Patient reported pain about 30 minutes later. Since she has had multiple doses of pain meds, I gave her a Lidocaine patch instead. This provided some pain relief.  I spoke with my attending, Dr. Criss Alvine, about this patient. Since, her EKG is unchanged with no ST changes, her troponin is within normal limits, and her BNP is in her normal range, she is safe for discharge home since emergent cardiac conditions - ACS, dissection, PE - have been ruled out.   I discussed results with patient. She is eager to go home. Instructed to follow up with her cardiologist in the next 48 hours. Patient is stable and safe for discharge  home. Return precautions provided.    Maxwell Marion, PA-C 12/15/22 1135    Pricilla Loveless, MD 12/15/22 2139

## 2022-12-14 NOTE — Discharge Instructions (Addendum)
As discussed, your imaging and labs do not indicate any injury to your heart. Wear Lidocaine patch once a day for 5 days for chest pain. Place patch on area of chest where there is pain. This is not covered by your insurance so you can pick some up at the pharmacy. They are available over the counter.   Please call your cardiologist to follow-up with them in 48 hours (on Monday).  Get help right away if: You feel sick to your stomach (nauseous) or you throw up (vomit). You feel sweaty or light-headed. You have a cough with mucus from your lungs (sputum) or you cough up blood. You are short of breath.

## 2022-12-16 ENCOUNTER — Telehealth (HOSPITAL_BASED_OUTPATIENT_CLINIC_OR_DEPARTMENT_OTHER): Payer: Self-pay | Admitting: Family

## 2022-12-16 NOTE — Telephone Encounter (Signed)
Called patient at request of NP, no answer, call could not be completed at this time, will reattempt contact

## 2022-12-16 NOTE — Telephone Encounter (Signed)
ED visit 12/14/22 with chest pain. Overdue for f/u with Dr. Cristal Deer and AHF team.   Will route to nursing team to assist in schedule follow up within next 3-4 weeks.   Alver Sorrow, NP

## 2022-12-17 NOTE — Telephone Encounter (Signed)
2nd call attempt to patient, call cannot be completed as dialed, will reattempt contact.

## 2022-12-19 ENCOUNTER — Encounter (HOSPITAL_BASED_OUTPATIENT_CLINIC_OR_DEPARTMENT_OTHER): Payer: Self-pay

## 2022-12-19 NOTE — Telephone Encounter (Signed)
3rd call attempt, no answer, call cannot be completed. Letter mailed to patient to call the office.

## 2022-12-26 ENCOUNTER — Encounter (HOSPITAL_BASED_OUTPATIENT_CLINIC_OR_DEPARTMENT_OTHER): Payer: Self-pay | Admitting: Cardiology

## 2023-02-06 ENCOUNTER — Inpatient Hospital Stay (HOSPITAL_COMMUNITY)
Admission: EM | Admit: 2023-02-06 | Discharge: 2023-02-12 | DRG: 291 | Disposition: A | Discharge status: Other Acute Inpatient Hospital | Payer: BLUE CROSS/BLUE SHIELD | Attending: Internal Medicine | Admitting: Internal Medicine

## 2023-02-06 ENCOUNTER — Telehealth (HOSPITAL_COMMUNITY): Payer: Self-pay | Admitting: Licensed Clinical Social Worker

## 2023-02-06 ENCOUNTER — Other Ambulatory Visit (HOSPITAL_COMMUNITY): Payer: BLUE CROSS/BLUE SHIELD

## 2023-02-06 ENCOUNTER — Encounter (HOSPITAL_COMMUNITY): Payer: Self-pay

## 2023-02-06 ENCOUNTER — Other Ambulatory Visit: Payer: Self-pay

## 2023-02-06 ENCOUNTER — Emergency Department (HOSPITAL_COMMUNITY): Payer: BLUE CROSS/BLUE SHIELD

## 2023-02-06 DIAGNOSIS — E1122 Type 2 diabetes mellitus with diabetic chronic kidney disease: Secondary | ICD-10-CM | POA: Diagnosis present

## 2023-02-06 DIAGNOSIS — F17210 Nicotine dependence, cigarettes, uncomplicated: Secondary | ICD-10-CM | POA: Diagnosis present

## 2023-02-06 DIAGNOSIS — I509 Heart failure, unspecified: Secondary | ICD-10-CM

## 2023-02-06 DIAGNOSIS — F314 Bipolar disorder, current episode depressed, severe, without psychotic features: Secondary | ICD-10-CM | POA: Diagnosis present

## 2023-02-06 DIAGNOSIS — Z818 Family history of other mental and behavioral disorders: Secondary | ICD-10-CM

## 2023-02-06 DIAGNOSIS — R0789 Other chest pain: Secondary | ICD-10-CM | POA: Diagnosis present

## 2023-02-06 DIAGNOSIS — Z794 Long term (current) use of insulin: Secondary | ICD-10-CM

## 2023-02-06 DIAGNOSIS — E559 Vitamin D deficiency, unspecified: Secondary | ICD-10-CM | POA: Insufficient documentation

## 2023-02-06 DIAGNOSIS — Z1152 Encounter for screening for COVID-19: Secondary | ICD-10-CM

## 2023-02-06 DIAGNOSIS — T1491XA Suicide attempt, initial encounter: Secondary | ICD-10-CM

## 2023-02-06 DIAGNOSIS — I110 Hypertensive heart disease with heart failure: Secondary | ICD-10-CM | POA: Diagnosis not present

## 2023-02-06 DIAGNOSIS — M79661 Pain in right lower leg: Secondary | ICD-10-CM | POA: Diagnosis present

## 2023-02-06 DIAGNOSIS — R0600 Dyspnea, unspecified: Principal | ICD-10-CM

## 2023-02-06 DIAGNOSIS — E785 Hyperlipidemia, unspecified: Secondary | ICD-10-CM | POA: Diagnosis present

## 2023-02-06 DIAGNOSIS — I5023 Acute on chronic systolic (congestive) heart failure: Secondary | ICD-10-CM | POA: Diagnosis present

## 2023-02-06 DIAGNOSIS — Z885 Allergy status to narcotic agent status: Secondary | ICD-10-CM

## 2023-02-06 DIAGNOSIS — R9431 Abnormal electrocardiogram [ECG] [EKG]: Secondary | ICD-10-CM | POA: Diagnosis present

## 2023-02-06 DIAGNOSIS — Z79899 Other long term (current) drug therapy: Secondary | ICD-10-CM

## 2023-02-06 DIAGNOSIS — Z8249 Family history of ischemic heart disease and other diseases of the circulatory system: Secondary | ICD-10-CM

## 2023-02-06 DIAGNOSIS — Z7951 Long term (current) use of inhaled steroids: Secondary | ICD-10-CM

## 2023-02-06 DIAGNOSIS — G8929 Other chronic pain: Secondary | ICD-10-CM | POA: Diagnosis present

## 2023-02-06 DIAGNOSIS — I5022 Chronic systolic (congestive) heart failure: Secondary | ICD-10-CM | POA: Diagnosis present

## 2023-02-06 DIAGNOSIS — Z7984 Long term (current) use of oral hypoglycemic drugs: Secondary | ICD-10-CM

## 2023-02-06 DIAGNOSIS — J449 Chronic obstructive pulmonary disease, unspecified: Secondary | ICD-10-CM | POA: Diagnosis present

## 2023-02-06 DIAGNOSIS — I428 Other cardiomyopathies: Secondary | ICD-10-CM | POA: Diagnosis present

## 2023-02-06 DIAGNOSIS — E739 Lactose intolerance, unspecified: Secondary | ICD-10-CM | POA: Diagnosis present

## 2023-02-06 DIAGNOSIS — I959 Hypotension, unspecified: Secondary | ICD-10-CM | POA: Diagnosis not present

## 2023-02-06 DIAGNOSIS — Z7982 Long term (current) use of aspirin: Secondary | ICD-10-CM

## 2023-02-06 DIAGNOSIS — F4310 Post-traumatic stress disorder, unspecified: Secondary | ICD-10-CM | POA: Diagnosis present

## 2023-02-06 DIAGNOSIS — I130 Hypertensive heart and chronic kidney disease with heart failure and stage 1 through stage 4 chronic kidney disease, or unspecified chronic kidney disease: Secondary | ICD-10-CM | POA: Diagnosis not present

## 2023-02-06 DIAGNOSIS — N179 Acute kidney failure, unspecified: Secondary | ICD-10-CM | POA: Diagnosis present

## 2023-02-06 DIAGNOSIS — E1165 Type 2 diabetes mellitus with hyperglycemia: Secondary | ICD-10-CM | POA: Diagnosis present

## 2023-02-06 DIAGNOSIS — F259 Schizoaffective disorder, unspecified: Secondary | ICD-10-CM | POA: Diagnosis present

## 2023-02-06 DIAGNOSIS — Z888 Allergy status to other drugs, medicaments and biological substances status: Secondary | ICD-10-CM

## 2023-02-06 DIAGNOSIS — Z833 Family history of diabetes mellitus: Secondary | ICD-10-CM

## 2023-02-06 DIAGNOSIS — M545 Low back pain, unspecified: Secondary | ICD-10-CM | POA: Diagnosis present

## 2023-02-06 DIAGNOSIS — I5020 Unspecified systolic (congestive) heart failure: Secondary | ICD-10-CM

## 2023-02-06 DIAGNOSIS — E1169 Type 2 diabetes mellitus with other specified complication: Secondary | ICD-10-CM | POA: Diagnosis present

## 2023-02-06 DIAGNOSIS — R45851 Suicidal ideations: Secondary | ICD-10-CM | POA: Diagnosis present

## 2023-02-06 DIAGNOSIS — F419 Anxiety disorder, unspecified: Secondary | ICD-10-CM | POA: Diagnosis present

## 2023-02-06 DIAGNOSIS — K649 Unspecified hemorrhoids: Secondary | ICD-10-CM | POA: Diagnosis present

## 2023-02-06 DIAGNOSIS — N189 Chronic kidney disease, unspecified: Secondary | ICD-10-CM | POA: Diagnosis present

## 2023-02-06 DIAGNOSIS — J441 Chronic obstructive pulmonary disease with (acute) exacerbation: Secondary | ICD-10-CM | POA: Diagnosis present

## 2023-02-06 DIAGNOSIS — E039 Hypothyroidism, unspecified: Secondary | ICD-10-CM | POA: Diagnosis present

## 2023-02-06 DIAGNOSIS — F3132 Bipolar disorder, current episode depressed, moderate: Secondary | ICD-10-CM | POA: Diagnosis present

## 2023-02-06 LAB — COMPREHENSIVE METABOLIC PANEL
ALT: 26 U/L (ref 0–44)
AST: 18 U/L (ref 15–41)
Albumin: 3.2 g/dL — ABNORMAL LOW (ref 3.5–5.0)
Alkaline Phosphatase: 106 U/L (ref 38–126)
Anion gap: 15 (ref 5–15)
BUN: 13 mg/dL (ref 6–20)
CO2: 23 mmol/L (ref 22–32)
Calcium: 8.5 mg/dL — ABNORMAL LOW (ref 8.9–10.3)
Chloride: 101 mmol/L (ref 98–111)
Creatinine, Ser: 0.85 mg/dL (ref 0.44–1.00)
GFR, Estimated: >60 mL/min (ref >60)
Glucose, Bld: 313 mg/dL — ABNORMAL HIGH (ref 70–99)
Potassium: 3.5 mmol/L (ref 3.5–5.1)
Sodium: 139 mmol/L (ref 135–145)
Total Bilirubin: 1.5 mg/dL — ABNORMAL HIGH (ref 0.3–1.2)
Total Protein: 6 g/dL — ABNORMAL LOW (ref 6.5–8.1)

## 2023-02-06 LAB — CBC WITH DIFFERENTIAL/PLATELET
Abs Immature Granulocytes: 0.04 10*3/uL (ref 0.00–0.07)
Basophils Absolute: 0.1 10*3/uL (ref 0.0–0.1)
Basophils Relative: 1 %
Eosinophils Absolute: 0.1 10*3/uL (ref 0.0–0.5)
Eosinophils Relative: 2 %
HCT: 35.8 % — ABNORMAL LOW (ref 36.0–46.0)
Hemoglobin: 11.8 g/dL — ABNORMAL LOW (ref 12.0–15.0)
Immature Granulocytes: 1 %
Lymphocytes Relative: 10 %
Lymphs Abs: 0.8 10*3/uL (ref 0.7–4.0)
MCH: 29.5 pg (ref 26.0–34.0)
MCHC: 33 g/dL (ref 30.0–36.0)
MCV: 89.5 fL (ref 80.0–100.0)
Monocytes Absolute: 0.4 10*3/uL (ref 0.1–1.0)
Monocytes Relative: 5 %
Neutro Abs: 6.1 10*3/uL (ref 1.7–7.7)
Neutrophils Relative %: 81 %
Platelets: 194 10*3/uL (ref 150–400)
RBC: 4 MIL/uL (ref 3.87–5.11)
RDW: 15.9 % — ABNORMAL HIGH (ref 11.5–15.5)
WBC: 7.5 10*3/uL (ref 4.0–10.5)
nRBC: 0.3 % — ABNORMAL HIGH (ref 0.0–0.2)

## 2023-02-06 LAB — I-STAT CHEM 8, ED
BUN: 14 mg/dL (ref 6–20)
Calcium, Ion: 1 mmol/L — ABNORMAL LOW (ref 1.15–1.40)
Chloride: 104 mmol/L (ref 98–111)
Creatinine, Ser: 0.7 mg/dL (ref 0.44–1.00)
Glucose, Bld: 297 mg/dL — ABNORMAL HIGH (ref 70–99)
HCT: 34 % — ABNORMAL LOW (ref 36.0–46.0)
Hemoglobin: 11.6 g/dL — ABNORMAL LOW (ref 12.0–15.0)
Potassium: 3.4 mmol/L — ABNORMAL LOW (ref 3.5–5.1)
Sodium: 139 mmol/L (ref 135–145)
TCO2: 23 mmol/L (ref 22–32)

## 2023-02-06 LAB — RESP PANEL BY RT-PCR (RSV, FLU A&B, COVID)  RVPGX2
Influenza A by PCR: NEGATIVE
Influenza B by PCR: NEGATIVE
Resp Syncytial Virus by PCR: NEGATIVE
SARS Coronavirus 2 by RT PCR: NEGATIVE

## 2023-02-06 LAB — MAGNESIUM: Magnesium: 1.7 mg/dL (ref 1.7–2.4)

## 2023-02-06 LAB — BRAIN NATRIURETIC PEPTIDE: B Natriuretic Peptide: 2678.9 pg/mL — ABNORMAL HIGH (ref 0.0–100.0)

## 2023-02-06 LAB — GLUCOSE, CAPILLARY: Glucose-Capillary: 496 mg/dL — ABNORMAL HIGH (ref 70–99)

## 2023-02-06 LAB — TROPONIN I (HIGH SENSITIVITY)
Troponin I (High Sensitivity): 32 ng/L — ABNORMAL HIGH (ref <18)
Troponin I (High Sensitivity): 25 ng/L — ABNORMAL HIGH (ref <18)

## 2023-02-06 MED ORDER — INSULIN ASPART 100 UNIT/ML IJ SOLN
2.0000 [IU] | Freq: Once | INTRAMUSCULAR | Status: AC
  Administered 2023-02-06: 2 [IU] via SUBCUTANEOUS

## 2023-02-06 MED ORDER — DOCUSATE SODIUM 100 MG PO CAPS
100.0000 mg | ORAL_CAPSULE | Freq: Two times a day (BID) | ORAL | Status: DC
  Administered 2023-02-06 – 2023-02-12 (×7): 100 mg via ORAL
  Filled 2023-02-06 (×10): qty 1

## 2023-02-06 MED ORDER — ALBUTEROL SULFATE (2.5 MG/3ML) 0.083% IN NEBU
2.5000 mg | INHALATION_SOLUTION | RESPIRATORY_TRACT | Status: DC
  Filled 2023-02-06: qty 3

## 2023-02-06 MED ORDER — RISPERIDONE 1 MG PO TABS: 1.0000 mg | ORAL_TABLET | ORAL | Status: DC

## 2023-02-06 MED ORDER — MAGNESIUM SULFATE 4 GM/100ML IV SOLN
4.0000 g | Freq: Once | INTRAVENOUS | Status: AC
  Administered 2023-02-06: 4 g via INTRAVENOUS
  Filled 2023-02-06: qty 100

## 2023-02-06 MED ORDER — METHYLPREDNISOLONE SODIUM SUCC 125 MG IJ SOLR
125.0000 mg | Freq: Once | INTRAMUSCULAR | Status: AC
  Administered 2023-02-06: 125 mg via INTRAVENOUS
  Filled 2023-02-06: qty 2

## 2023-02-06 MED ORDER — INSULIN ASPART 100 UNIT/ML IJ SOLN
0.0000 [IU] | Freq: Every day | INTRAMUSCULAR | Status: DC
  Administered 2023-02-06 – 2023-02-07 (×2): 5 [IU] via SUBCUTANEOUS
  Administered 2023-02-08: 3 [IU] via SUBCUTANEOUS
  Administered 2023-02-09: 4 [IU] via SUBCUTANEOUS
  Administered 2023-02-10: 2 [IU] via SUBCUTANEOUS

## 2023-02-06 MED ORDER — INSULIN ASPART 100 UNIT/ML IJ SOLN: 0.0000 [IU] | Freq: Three times a day (TID) | INTRAMUSCULAR | Status: DC

## 2023-02-06 MED ORDER — INSULIN GLARGINE-YFGN 100 UNIT/ML ~~LOC~~ SOLN
24.0000 [IU] | Freq: Every day | SUBCUTANEOUS | Status: DC
  Administered 2023-02-06 – 2023-02-11 (×6): 24 [IU] via SUBCUTANEOUS
  Filled 2023-02-06 (×8): qty 0.24

## 2023-02-06 MED ORDER — ACETAMINOPHEN 650 MG RE SUPP: 650.0000 mg | Freq: Four times a day (QID) | RECTAL | Status: DC

## 2023-02-06 MED ORDER — PREDNISONE 20 MG PO TABS
40.0000 mg | ORAL_TABLET | Freq: Every day | ORAL | Status: DC
  Administered 2023-02-07: 40 mg via ORAL
  Filled 2023-02-06: qty 2

## 2023-02-06 MED ORDER — ONDANSETRON 4 MG PO TBDP
4.0000 mg | ORAL_TABLET | Freq: Once | ORAL | Status: AC
  Administered 2023-02-06: 4 mg via ORAL
  Filled 2023-02-06: qty 1

## 2023-02-06 MED ORDER — INSULIN ASPART 100 UNIT/ML IJ SOLN
0.0000 [IU] | Freq: Three times a day (TID) | INTRAMUSCULAR | Status: DC
  Administered 2023-02-07: 3 [IU] via SUBCUTANEOUS
  Administered 2023-02-07 (×2): 11 [IU] via SUBCUTANEOUS
  Administered 2023-02-08: 2 [IU] via SUBCUTANEOUS
  Administered 2023-02-08: 3 [IU] via SUBCUTANEOUS
  Administered 2023-02-09: 11 [IU] via SUBCUTANEOUS
  Administered 2023-02-10 (×2): 5 [IU] via SUBCUTANEOUS
  Administered 2023-02-10: 3 [IU] via SUBCUTANEOUS
  Administered 2023-02-11: 2 [IU] via SUBCUTANEOUS
  Administered 2023-02-11 – 2023-02-12 (×3): 3 [IU] via SUBCUTANEOUS
  Administered 2023-02-12: 5 [IU] via SUBCUTANEOUS
  Administered 2023-02-12: 3 [IU] via SUBCUTANEOUS

## 2023-02-06 MED ORDER — RIVAROXABAN 10 MG PO TABS
10.0000 mg | ORAL_TABLET | Freq: Every day | ORAL | Status: DC
  Administered 2023-02-06 – 2023-02-12 (×7): 10 mg via ORAL
  Filled 2023-02-06 (×7): qty 1

## 2023-02-06 MED ORDER — SENNA 8.6 MG PO TABS
1.0000 | ORAL_TABLET | Freq: Two times a day (BID) | ORAL | Status: DC
  Administered 2023-02-06 – 2023-02-12 (×7): 8.6 mg via ORAL
  Filled 2023-02-06 (×10): qty 1

## 2023-02-06 MED ORDER — ALBUTEROL SULFATE (2.5 MG/3ML) 0.083% IN NEBU: 2.5000 mg | INHALATION_SOLUTION | Freq: Four times a day (QID) | RESPIRATORY_TRACT | Status: DC | PRN

## 2023-02-06 MED ORDER — MORPHINE SULFATE (PF) 4 MG/ML IV SOLN
4.0000 mg | Freq: Once | INTRAVENOUS | Status: AC
  Administered 2023-02-06: 4 mg via INTRAVENOUS
  Filled 2023-02-06: qty 1

## 2023-02-06 MED ORDER — ACETAMINOPHEN 500 MG PO TABS
1000.0000 mg | ORAL_TABLET | Freq: Four times a day (QID) | ORAL | Status: DC
  Administered 2023-02-06 – 2023-02-12 (×21): 1000 mg via ORAL
  Filled 2023-02-06 (×22): qty 2

## 2023-02-06 MED ORDER — IPRATROPIUM-ALBUTEROL 0.5-2.5 (3) MG/3ML IN SOLN
3.0000 mL | Freq: Once | RESPIRATORY_TRACT | Status: AC
  Administered 2023-02-06: 3 mL via RESPIRATORY_TRACT
  Filled 2023-02-06: qty 3

## 2023-02-06 MED ORDER — ARFORMOTEROL TARTRATE 15 MCG/2ML IN NEBU
15.0000 ug | INHALATION_SOLUTION | Freq: Two times a day (BID) | RESPIRATORY_TRACT | Status: DC
  Filled 2023-02-06: qty 2

## 2023-02-06 MED ORDER — OXYCODONE HCL 5 MG PO TABS
10.0000 mg | ORAL_TABLET | Freq: Four times a day (QID) | ORAL | Status: DC | PRN
  Administered 2023-02-06 – 2023-02-12 (×22): 10 mg via ORAL
  Filled 2023-02-06 (×23): qty 2

## 2023-02-06 MED ORDER — BUDESONIDE 0.25 MG/2ML IN SUSP
0.2500 mg | Freq: Two times a day (BID) | RESPIRATORY_TRACT | Status: DC
  Filled 2023-02-06 (×2): qty 2

## 2023-02-06 MED ORDER — REVEFENACIN 175 MCG/3ML IN SOLN
175.0000 ug | Freq: Every day | RESPIRATORY_TRACT | Status: DC
  Administered 2023-02-06: 175 ug via RESPIRATORY_TRACT
  Filled 2023-02-06 (×2): qty 3

## 2023-02-06 MED ORDER — ALBUTEROL SULFATE (2.5 MG/3ML) 0.083% IN NEBU: 2.5000 mg | INHALATION_SOLUTION | RESPIRATORY_TRACT | Status: DC | PRN

## 2023-02-06 MED ORDER — RISPERIDONE 1 MG PO TABS
1.0000 mg | ORAL_TABLET | Freq: Every morning | ORAL | Status: DC
  Administered 2023-02-07 – 2023-02-12 (×6): 1 mg via ORAL
  Filled 2023-02-06 (×7): qty 1

## 2023-02-06 MED ORDER — FUROSEMIDE 10 MG/ML IJ SOLN
40.0000 mg | Freq: Once | INTRAMUSCULAR | Status: AC
  Administered 2023-02-06: 40 mg via INTRAVENOUS
  Filled 2023-02-06: qty 4

## 2023-02-06 MED ORDER — METOPROLOL SUCCINATE ER 25 MG PO TB24
12.5000 mg | ORAL_TABLET | Freq: Every day | ORAL | Status: DC
  Administered 2023-02-06 – 2023-02-07 (×2): 12.5 mg via ORAL
  Filled 2023-02-06 (×2): qty 1

## 2023-02-06 MED ORDER — POLYETHYLENE GLYCOL 3350 17 G PO PACK
17.0000 g | PACK | Freq: Every day | ORAL | Status: DC
  Administered 2023-02-07: 17 g via ORAL
  Filled 2023-02-06 (×5): qty 1

## 2023-02-06 MED ORDER — RISPERIDONE 2 MG PO TABS
2.0000 mg | ORAL_TABLET | Freq: Every day | ORAL | Status: DC
  Administered 2023-02-06 – 2023-02-11 (×6): 2 mg via ORAL
  Filled 2023-02-06 (×8): qty 1

## 2023-02-06 MED ORDER — ROSUVASTATIN CALCIUM 20 MG PO TABS
20.0000 mg | ORAL_TABLET | Freq: Every day | ORAL | Status: DC
  Administered 2023-02-06 – 2023-02-12 (×7): 20 mg via ORAL
  Filled 2023-02-06 (×7): qty 1

## 2023-02-06 MED ORDER — LIDOCAINE 5 % EX PTCH
1.0000 | MEDICATED_PATCH | CUTANEOUS | Status: DC
  Filled 2023-02-06: qty 1

## 2023-02-06 MED ORDER — POTASSIUM CHLORIDE CRYS ER 20 MEQ PO TBCR
40.0000 meq | EXTENDED_RELEASE_TABLET | Freq: Two times a day (BID) | ORAL | Status: AC
  Administered 2023-02-06 (×2): 40 meq via ORAL
  Filled 2023-02-06 (×2): qty 2

## 2023-02-06 NOTE — ED Provider Notes (Signed)
[place] EMERGENCY DEPARTMENT AT [facility] Provider Note   CSN: [id number] Arrival date & time: 02/06/23  1031     History  Chief Complaint  Patient presents with   Shortness of Breath    CODI LANSDEN is a 52 y.o. female.  52 year old female with prior medical history as detailed below presents for evaluation.  Patient reports gradually increasing shortness of breath x 3 days.  She also reports proximately 5 to 7 pounds of weight gain.  Patient reports history of CHF.  She reports increased lower extremity edema bilaterally.  Patient reports that this morning her symptoms were worse.  She developed vague chest discomfort as well.  She decided to come to the ED for evaluation.  EMS administered aspirin and 1 nitroglycerin sublingually without significant improvement in her symptoms.   The history is provided by the patient and medical records.       Home Medications Prior to Admission medications   Medication Sig Start Date End Date Taking? Authorizing Provider  acetaminophen (TYLENOL) 325 MG tablet Take 1 tablet (325 mg total) by mouth every 6 (six) hours as needed for headache or mild pain. 09/27/22   Arrien, York Ram, MD  albuterol (VENTOLIN HFA) 108 (90 Base) MCG/ACT inhaler Inhale 2 puffs into the lungs every 6 (six) hours as needed for wheezing or shortness of breath. 09/27/22   Arrien, York Ram, MD  ASPIRIN LOW DOSE 81 MG tablet Take 1 tablet (81 mg total) by mouth daily. 10/11/22   Alen Bleacher, NP  blood glucose meter kit and supplies Dispense based on patient and insurance preference. Use up to four times daily as directed. (FOR ICD-10 E10.9, E11.9). 01/06/18   Rodolph Bong, MD  bumetanide (BUMEX) 2 MG tablet Take 1 tablet (2 mg total) by mouth 2 (two) times daily. 10/11/22 07/08/23  Alen Bleacher, NP  Cholecalciferol (VITAMIN D3) 50 MCG (2000 UT) CAPS Patient takes 1 tablet by mouth once a week on Fridays.    [provider]   empagliflozin (JARDIANCE) 10 MG TABS tablet Take 1 tablet (10 mg total) by mouth daily. 10/11/22   Alen Bleacher, NP  insulin glargine, 1 Unit Dial, (TOUJEO SOLOSTAR) 300 UNIT/ML Solostar Pen Inject 24 Units into the skin at bedtime. 10/11/22 11/10/22  Alen Bleacher, NP  Insulin Pen Needle 32G X 4 MM MISC Use As Directed 09/30/22   Arrien, York Ram, MD  ipratropium-albuterol (DUONEB) 0.5-2.5 (3) MG/3ML SOLN inhale 3 mLs by nebulization every 6 (six) hours as needed. 10/11/22   Alen Bleacher, NP  losartan (COZAAR) 25 MG tablet Take 1/2 tablet (12.5 mg total) by mouth daily. 10/11/22 01/09/23  Alen Bleacher, NP  methimazole (TAPAZOLE) 5 MG tablet Take 1 tablet (5 mg total) by mouth daily. Patient not taking: Reported on 11/08/2022 10/11/22 12/08/22  Alen Bleacher, NP  metoprolol succinate (TOPROL-XL) 25 MG 24 hr tablet Take 1/2 tablet (12.5 mg total) by mouth daily. 10/11/22 07/08/23  Alen Bleacher, NP  mometasone-formoterol (DULERA) 200-5 MCG/ACT AERO Inhale 2 puffs into the lungs 2 (two) times daily. 09/27/22   Arrien, York Ram, MD  nitroGLYCERIN (NITROSTAT) 0.4 MG SL tablet Place 1 tablet (0.4 mg total) under the tongue every 5 (five) minutes as needed for chest pain. 09/27/22   Arrien, York Ram, MD  oxyCODONE-acetaminophen (PERCOCET) 10-325 MG tablet Take 1 tablet by mouth every 6 (six) hours as needed for pain. 09/02/22   Wouk, Wilfred Curtis, MD  potassium chloride (KLOR-CON M) 10 MEQ tablet Take 1 tablet (10 mEq total) by mouth daily. 10/11/22 07/08/23  Alen Bleacher, NP  risperiDONE (RISPERDAL) 2 MG tablet Take 1/2 tablet in the morning and one tablet in the evening. 10/11/22   Alen Bleacher, NP  rosuvastatin (CRESTOR) 20 MG tablet Take 1 tablet (20 mg total) by mouth daily. 10/11/22 11/10/22  Alen Bleacher, NP  spironolactone (ALDACTONE) 25 MG tablet Take 0.5 tablets (12.5 mg total) by mouth daily. Patient not taking: Reported on 11/08/2022 09/27/22 10/27/22  Arrien, York Ram, MD  benztropine (COGENTIN) 1 MG  tablet Take 1 tablet (1 mg total) by mouth 2 (two) times daily. 12/23/18 06/04/20  Malvin Johns, MD  insulin aspart (NOVOLOG) 100 UNIT/ML injection Inject 0-9 Units into the skin 3 (three) times daily with meals. Patient not taking: Reported on 06/03/2020 12/16/18 06/04/20  Lanae Boast, MD      Allergies    Tramadol, Benadryl [diphenhydramine], Lactose intolerance (gi), Wellbutrin [bupropion], and Nicotine    Review of Systems   Review of Systems  All other systems reviewed and are negative.   Physical Exam Updated Vital Signs BP 131/88 (BP Location: Right Arm)   Pulse 92   Temp 98.6 F (37 C) (Oral)   Resp 18   Ht 5\' 5"  (1.651 m)   Wt 72.6 kg   SpO2 94%   BMI 26.63 kg/m  Physical Exam Vitals and nursing note reviewed.  Constitutional:      General: She is not in acute distress.    Appearance: Normal appearance. She is well-developed.  HENT:     Head: Normocephalic and atraumatic.  Eyes:     Conjunctiva/sclera: Conjunctivae normal.     Pupils: Pupils are equal, round, and reactive to light.  Cardiovascular:     Rate and Rhythm: Normal rate and regular rhythm.     Heart sounds: Normal heart sounds.  Pulmonary:     Effort: Pulmonary effort is normal. No respiratory distress.     Comments: Diffuse scattered expiratory wheezes in all lung fields Abdominal:     General: There is no distension.     Palpations: Abdomen is soft.     Tenderness: There is no abdominal tenderness.  Musculoskeletal:        General: No deformity. Normal range of motion.     Cervical back: Normal range of motion and neck supple.     Right lower leg: Edema present.     Left lower leg: Edema present.  Skin:    General: Skin is warm and dry.  Neurological:     General: No focal deficit present.     Mental Status: She is alert and oriented to person, place, and time.     ED Results / Procedures / Treatments   Labs (all labs ordered are listed, but only abnormal results are displayed) Labs  Reviewed  CBC WITH DIFFERENTIAL/PLATELET  BRAIN NATRIURETIC PEPTIDE  COMPREHENSIVE METABOLIC PANEL  I-STAT CHEM 8, ED  TROPONIN I (HIGH SENSITIVITY)    EKG  EKG Interpretation Date/Time:  Thursday February 06 2023 10:47:26 EDT Ventricular Rate:  87 PR Interval:  166 QRS Duration:  108 QT Interval:  458 QTC Calculation: 551 R Axis:   -24  Text Interpretation: Critical Test Result: Long QTc Normal sinus rhythm Left atrial enlargement Left ventricular hypertrophy ( Cornell product , Romhilt-Estes ) Nonspecific T wave abnormality Prolonged QT Abnormal ECG When compared with ECG of 14-Dec-2022 13:09, PREVIOUS ECG IS PRESENT Confirmed by  Kristine Royal ([id number]) on 02/06/2023 12:50:41 PM Radiology No results found.  Procedures Procedures    Medications Ordered in ED Medications  ipratropium-albuterol (DUONEB) 0.5-2.5 (3) MG/3ML nebulizer solution 3 mL (has no administration in time range)  methylPREDNISolone sodium succinate (SOLU-MEDROL) 125 mg/2 mL injection 125 mg (has no administration in time range)  morphine (PF) 4 MG/ML injection 4 mg (has no administration in time range)    ED Course/ Medical Decision Making/ A&P                                 Medical Decision Making Amount and/or Complexity of Data Reviewed Labs: ordered. Radiology: ordered.  Risk Prescription drug management. Decision regarding hospitalization.    Medical Screen Complete  This patient presented to the ED with complaint of sob.  This complaint involves an extensive number of treatment options. The initial differential diagnosis includes, but is not limited to, exacerbation, CHF exacerbation, metabolic abnormality.  This presentation is: Acute, Chronic, Self-Limited, Previously Undiagnosed, Uncertain Prognosis, Complicated, Systemic Symptoms, and Threat to Life/Bodily Function  Patient with increased shortness of breath and self-reported lower extremity edema with reported weight  gain.  Patient with expiratory wheezes on evaluation.  Patient given breathing treatment, Solu-Medrol, diuretic.  Presentation is concerning for possible CHF exacerbation and possible COPD exacerbation.  Medicine team is aware of case and will evaluate for admission.   Additional history obtained: External records from outside sources obtained and reviewed including prior ED visits and prior Inpatient records.    Lab Tests:  I ordered and personally interpreted labs.  The pertinent results include: CBC, CMP, troponin, BNP   Imaging Studies ordered:  I ordered imaging studies including chest x-ray I independently visualized and interpreted obtained imaging which showed possible pulmonary edema I agree with the radiologist interpretation.   Cardiac Monitoring:  The patient was maintained on a cardiac monitor.  I personally viewed and interpreted the cardiac monitor which showed an underlying rhythm of: NSR   Medicines ordered:  I ordered medication including DuoNeb treatment, Solu-Medrol, morphine, Lasix for suspected CHF, COPD exacerbation Reevaluation of the patient after these medicines showed that the patient: improved  Problem List / ED Course:  Dyspnea   Reevaluation:  After the interventions noted above, I reevaluated the patient and found that they have: improved   Disposition:  After consideration of the diagnostic results and the patients response to treatment, I feel that the patent would benefit from admission.          Final Clinical Impression(s) / ED Diagnoses Final diagnoses:  Dyspnea, unspecified type    Rx / DC Orders ED Discharge Orders     None         Wynetta Fines, MD 02/06/23 [phone]

## 2023-02-06 NOTE — Telephone Encounter (Signed)
Outpatient HF Clinic met with pt in ED to touch base after patient went out of contact with HF Clinic.  Pt reports she was staying at [facility] for a bit but in July moved in with a friend near the [place] and pays rent.  She reports losing contact because we did not load more minutes on her phone then she lost the phone that we provided her- reports she can't keep up with $15 a month to keep minutes on the phone.  Will discuss with supervisor if we can provide another phone  Pt hasn't been on any meds last few months due to difficulty paying for them and picking them up  CSW will plan to follow up with patient and try and reengage with clinic  Burna Sis, LCSW Clinical Social Worker [facility] Desk#: [phone] Cell#: [phone]

## 2023-02-06 NOTE — Hospital Course (Addendum)
[name] [name] is a 52 yo F with PMH significant for HFrEF (20-25% March 2024), nonischemic cardiomyopathy, COPD, T2DM, schizoaffective disorder, and bipolar 1 disorder who presented for shortness of breath and chest pain and admitted for workup ofor HFrEF exacerbation and COPD exacerbation.   #Chest pain  Patient presented with atypical chest pain at rest that is centrally located and radiates to her belly button. Exacerbated by palpation and deep inspiration. No exacerbated by cough or change in position. Chest pain is present at rest and not worse with exertion. Not relieved by nitroglycerin. She has a history of noncardiogenic cardiomyopathy. Her EKGs did not show ischemic changes or any new findings. Trop mildly elevated but flat (32,25). Chest pain is likely noncardiogenic.  -repeat EKG unchanged. Of note, prolonged Qtc previously present  -cardiac telemetry, lidocaine patch to chest, Oxy 10mg  q6h PRN    #HFrEF exacerbation  #HTN Patient has a history of HFrEF (EF 20-25% March 2024) and she has not taken any of her medications since June. She reports shortness of breath, orthopnea, and increased LE swelling that started three days ago. BNP 2,678 (previous BNP have not been this elevated). On exam patient has crackles in bilateral lower lung fields, 1+ pitting edema bilaterally, and mild JVD. Her GDMT meds per discharge in April include Toprol, Losartan, Spironolactone, Jardiance, Imdur, and Bumetanide.  -Gave one dose IV Lasix, wt incr 4lbs overnight, no I/O  -Hold Losartan, Spironolactone, Jardiance, and Bumetanide overnight while diuresing with Lasix  -Start home Toprol-XL 12.5mg  once daily -low to normal tensive (97/55)  -Check daily weights, Is/Os -Echo pending    #COPD exacerbation  COPD exacerbation cannot be ruled out as patient reports cough and shortness of breath. She was discharged on Duoneb and albuterol rescue inhaler. She reports that she ran out of it. Diffuse wheezing heard  on auscultation.  -Start albuterol, brovana, budesonide, and revefenacin   -CXR without evidence of consolidation or infiltrate to suggest CAP and pt does not have change in sputum, will hold off on antibiotics for now -Start prednisone for COPD exacerbation given patient is wheezing on exam  -negative Covid and flu   #AKI  Baseline Cr 0.8-0.9. Cr 1.26 this morning from 0.70 last night.  Gave one dose of IV lasix, may need fluids, continue to hold losartan and diuretics  #T2DM Patient discharged on Toujeo 24units at bedtime but she ran out of it in June. Glucose elevated in the ED. Last A1C in March 11.0.  -Started Semglee 24units at bedtime and SSI  -Glucose 496 overnight, no AG, given 7 units -A1C 8.4 from 11 in March    #Right lower leg pain  Right calf tender on calf squeeze. Patient endorses less activity in the last few days.  -Dopper of right lower extremity to rule out DVT pending   #History of chronic low back pain Patient reports that she has chronic low back pain that she takes Oxy 10mg  q6h for. She has not run out of this medication at home.  -Continue home Oxycodone 10mg  q6h PRN  -Bowel regimen: senna BID, miralax once daily   #Schizoaffective disorder #Bipolar 1 Disorder Patient was on risperidone 1mg  in the morning and 2mg  at bedtime at last discharge.  -Continue risperidone    #Hyperlipidemia Continue Crestor 20mg  once daily   #Possible hyperthyroidism Possible hyperthyroidism per March admission. Patient had labs indicating hyperthyroidism and was discharged on methimazole for 21 days. At the time TSH 0.259 and free T4 1.4.  -No methimazole    #  Hypomagnesemia Mg 1.7 on admission and repleted. Resolved.   #IDA  8/30  "My chest pain hasn't stopped." Did not sleep well last night. This morning, it had worsened to a 9/10. At start of interview, nursing was in the process of preparing an EKG. Oxycodone has not helped, here or at home. She says this is why she  presented to the hospital. Took 4x nitroglycerin yesterday to little effect. Does not change on movement.   Patient winded on walking to bathroom: notes shallower breathing and light-headedness on rising. Status-post "breathing treatments" which patient did not believe she needed.  BM today -- notes some bleeding in setting of hemorrhoids.  Has continuing back pain. Also notes abdominal pain to navel, worse on palpation. Patient ran out of medications in June. Receives refills at [facility]. Somewhat poor historian when discussing medication prescription practices.   Constitutional: middle-aged woman laying in bed, in moderate distress. Attentive to interview. Voice is soft. Cardiovascular: JVD+.  Respiratory: Wheezing improved   Abdominal: Soft, distended. Tenderness on palpation.  MSK: +2 pitting edema in bilateral lower limbs. Neuro: Sensation intact.  Psych: Appropriate mood and affect.   EKG in room was similar to result in ED, was not significant for ongoing infarct. QtC 477.

## 2023-02-06 NOTE — Progress Notes (Signed)
Heart Failure Navigator Progress Note  Assessed for Heart & Vascular TOC clinic readiness.  Patient does not meet criteria due to been established heart failure patient.   Navigator will sign off at this time.    Jenell Dobransky,RN, BSN,MSN Heart Failure Nurse Navigator. Contact by secure chat only.

## 2023-02-06 NOTE — H&P (Addendum)
Date: 02/06/2023               Patient Name:  [name] [name] MRN: [id number]  DOB: [birth year] Age / Sex: 52 y.o., female   PCP: [facility]              Medical Service: Internal Medicine Teaching Service              Attending Physician: Dr. Inez Catalina, MD    First Contact: Lang Snow, OMS 4 Pager: [phone]  Second Contact: Dr. Marrianne Mood Pager: [phone]            After Hours (After 5p/  First Contact Pager: [phone]  weekends / holidays): Second Contact Pager: [phone]   Chief Complaint: shortness of breath and chest pain  History of Present Illness: [name] [name] is a 52 yo F with PMH significant for HFrEF (20-25% March 2024), nonischemic cardiomyopathy, COPD, T2DM, schizoaffective disorder, and bipolar 1 disorder who presented for shortness of breath that started three days ago and chest pain that started this morning. The shortness of breath is worse when she is walking around. Also reports orthopnea but no paroxysmal nocturnal dyspnea. She sleeps with one pillow at home and reports feeling like she is drowning. She reports increased bilateral lower extremity swelling. She has been coughing but denied change in sputum. The chest pain is located in the center of the chest and describes as sharp 9/10 pain that comes and goes and radiates to her belly button. Exacerbated by palpation and deep inspiration. No exacerbated by cough or change in position. No recent traveling or sick contacts. Of note, patient ran out of all of her medications, except for 10mg  Oxy q6h PRN that she takes for chronic low back pain. The last time she took her medications was in June.   She was recently hospitalized in April requiring intubation for acute exacerbation of COPD and acute respiratory failure.   Denied fever, chills, vomiting, abdominal pain, reflux. Reports nausea and passing a blood clot in her stool a few days ago.   ED Course: Patient is hemodynamically stable and  saturating >92% on room air. CXR showed enlargement of the cardiopericardial silhouette with pulmonary vascular congesion and possible mild interstitial edema. Pertinent labs in the ED includes, BNP 2,678, Trop 32 and 25, K 3.4, glucose 297, WBC 7.5, Hb 11.6. EKG in normal sinus rhythm and without evidence of ischemic changes. Patient was given Duoneb, one dose IV Solu-Medrol, and one dose Morphine.  Meds:  Patient has not taken any medications since June because she ran out of medications.  Per last hospital discharge in April, patient is supposed to be on: Aspirin 81mg  once daily Bumetanide 2mg  BID Dulera BID Duoneb q6h PRN Imdur 30mg  once daily  Jardiance 10mg  once daily  Methimazole 5mg  once daily for 21 days Toprol-XL 12.5mg  once daily  Nitroglycerin 0.4mg  SL tablet PRN Percocet 10-325mg  q6h PRN Potassium chloride once daily  Risperidone 2mg  1.5tablets in the morning and one tablet in the evening Crestor 20mg  once daily Spironolactone 12.5mg  once daily  Toujeo 24units at bedtime Albuterol q6h PRN   Allergies: Allergies as of 02/06/2023 - Review Complete 02/06/2023  Allergen Reaction Noted   Tramadol Nausea Only and Rash 05/22/2012   Benadryl [diphenhydramine] Other (See Comments) 09/17/2022   Lactose intolerance (gi)  09/24/2022   Wellbutrin [bupropion] Other (See Comments) 08/07/2020   Nicotine Rash 04/29/2014   Past Medical History:  Diagnosis Date   Anxiety disorder  Asthma    Bipolar affective disorder (HCC)    Blood transfusion without reported diagnosis    CHF (congestive heart failure) (HCC)    COPD (chronic obstructive pulmonary disease) (HCC)    Depression    Diabetes mellitus    Migraine    Schizophrenia (HCC)     Family History: Denied family history of lung disease. Grandfather had heart disease that she did not know specifics of.   Social History:  Smokes 5 cigarettes a day starting at age 53  Denies alcohol consumption and use of illicit  drugs  Lives in an apartment by herself, her son comes to check on her  Does not currently work  Independent with all ADLs Does not use O2 at baseline, walks without difficulty  No PCP, but she sees cardio Dr, Cristal Deer   Review of Systems: A complete ROS was negative except as per HPI.   Physical Exam: Blood pressure (!) 141/103, pulse 95, temperature 98.6 F (37 C), temperature source Oral, resp. rate 18, height 5\' 5"  (1.651 m), weight 72.6 kg, SpO2 95%. General: NAD, alert and oriented HEENT: NCAT, PERRLA, EOMI, vision grossly intact, sclera anicteric, moist mucous membrane Neck: Supple, trachea midline, mild JVD.  Lungs: normal respiratory effort on room air, crackles and bilateral lower lobes, diffuse wheezing Heart: RRR, no M/R/G.  Abdomen: +BS. Soft, NTND, no HSM, no palpable masses. No gross abnormality on inspection Extremities: MAE. FROM. 1+ pitting edema in bilateral LE. Right calf tender to calf squeeze, Left calf nontender. Dorsalis pedis pulses 2+ bilaterally. No erythema in lower extremities.  Neuro: alert and oriented, nonfocal.  Skin: warm, dry, good turgor.  Psych: cooperative, appropriate mood and affect.   EKG: personally reviewed my interpretation is normal sinus rhythm with left atrial enlargement prolonged QT, and T wave inversions in leads V5 and V6.  DG Chest Port 1 View:  COMPARISON:  12/14/2022   FINDINGS: The cardio pericardial silhouette is enlarged. There is pulmonary vascular congestion without overt pulmonary edema. Component of subtle interstitial edema not excluded. No focal consolidation or substantial pleural effusion. No acute bony abnormality.   IMPRESSION: Enlargement of the cardiopericardial silhouette with pulmonary vascular congestion and possible mild interstitial edema.   Assessment & Plan by Problem: Principal Problem:   CHF exacerbation (HCC) #Chest pain  Patient presented with atypical chest pain at rest that is centrally  located and radiates to her belly button. Exacerbated by palpation and deep inspiration. No exacerbated by cough or change in position. Chest pain is present at rest and not worse with exertion. Not relieved by nitroglycerin. She has a history of noncardiogenic cardiomyopathy. Her EKGs did not show ischemic changes or any new findings. Trop mildly elevated but flat. Chest pain is likely noncardiogenic.  -Repeat EKG in the morning, cardiac telemetry, lidocaine patch to chest, Oxy 10mg  q6h PRN   #HFrEF exacerbation  #HTN Patient has a history of HFrEF (EF 20-25% March 2024) and she has not taken any of her medications since June. She reports shortness of breath, orthopnea, and increased LE swelling that started three days ago. BNP 2,678 (previous BNP have not been this elevated). On exam patient has crackles in bilateral lower lung fields, 1+ pitting edema bilaterally, and mild JVD. Her GDMT meds per discharge in April include Toprol, Losartan, Spironolactone, Jardiance, Imdur, and Bumetanide. H-Give one dose IV Lasix  -Hold Losartan, Spironolactone, Jardiance, and Bumetanide overnight while diuresing with Lasix  -Start home Toprol-XL 12.5mg  once daily  -Check daily weights, Is/Os,   #  COPD exacerbation  COPD exacerbation cannot be ruled out as patient reports cough and shortness of breath. She was discharged on Duoneb and albuterol rescue inhaler. She reports that she ran out of it. Diffuse wheezing heard on auscultation.  -Start albuterol, brovana, budesonide, and revefenacin   -CXR without evidence of consolidation or infiltrate to suggest CAP, will hold off on antibiotics for now -Start prednisone for COPD exacerbation given patient is wheezing on exam  -Will check Covid and flu   #T2DM Patient discharged on Toujeo 24units at bedtime but she ran out of it in June. Glucose elevated in the ED. Last A1C in March 11.0.  -Start Semglee 24units at bedtime and SSI   #Right lower leg pain  Right calf  tender on calf squeeze. Patient endorses less activity in the last few days.  -Dopper of right lower extremity to rule out DVT  #History of chronic low back pain Patient reports that she has chronic low back pain that she takes Oxy 10mg  q6h for. She has not run out of this medication at home.  -Continue home Oxycodone 10mg  q6h PRN  -Bowel regimen: senna BID, miralax once daily  #Schizoaffective disorder #Bipolar 1 Disorder Patient was on risperidone 1mg  in the morning and 2mg  at bedtime at last discharge.  -Continue risperidone   #Hyperlipidemia Continue Crestor 20mg  once daily  #Possible hyperthyroidism Possible hyperthyroidism per March admission. Patient had labs indicating hyperthyroidism and was discharged on methimazole for 21 days. At the time TSH 0.259 and free T4 1.4.  -Check TSH, T4, T3  #Hypomagnesemia Mg 1.7 and repleted with 4g  Best Practice: Diet: normal IVF: none, fluid restriction of  VTE: Xarelto  AB: none  Therapy Recs: PT/OT eval pending  Family Contact: Son, Judie Grieve, is healthcare POA  Prior to Admission Living Arrangement: Home Anticipated Discharge Location: [place] Barriers to Discharge: chest pain and shortness of breath workup  Dispo: Admit patient to Observation with expected length of stay less than 2 midnights.  Signed: Lang Snow, Medical Student 02/06/2023, 3:53 PM  Pager: [phone]  Attestation for Student Documentation:  I personally was present and performed or re-performed the history, physical exam and medical decision-making activities of this service and have verified that the service and findings are accurately documented in the student's note.  Marrianne Mood MD 02/06/2023, 10:20 PM

## 2023-02-06 NOTE — ED Triage Notes (Signed)
Sob x 3 days.  Increased with exertion and lying flat along with chest pressure that started this am.  Increased lower extremity swelling.  Hx CHF. EMS gave ASA and 1 nitroglycerin with no relief

## 2023-02-07 ENCOUNTER — Observation Stay (HOSPITAL_BASED_OUTPATIENT_CLINIC_OR_DEPARTMENT_OTHER): Payer: BLUE CROSS/BLUE SHIELD

## 2023-02-07 ENCOUNTER — Observation Stay (HOSPITAL_COMMUNITY): Payer: BLUE CROSS/BLUE SHIELD

## 2023-02-07 DIAGNOSIS — E1122 Type 2 diabetes mellitus with diabetic chronic kidney disease: Secondary | ICD-10-CM | POA: Diagnosis not present

## 2023-02-07 DIAGNOSIS — I130 Hypertensive heart and chronic kidney disease with heart failure and stage 1 through stage 4 chronic kidney disease, or unspecified chronic kidney disease: Secondary | ICD-10-CM | POA: Diagnosis not present

## 2023-02-07 DIAGNOSIS — Z794 Long term (current) use of insulin: Secondary | ICD-10-CM | POA: Diagnosis not present

## 2023-02-07 DIAGNOSIS — F17210 Nicotine dependence, cigarettes, uncomplicated: Secondary | ICD-10-CM | POA: Diagnosis present

## 2023-02-07 DIAGNOSIS — F3130 Bipolar disorder, current episode depressed, mild or moderate severity, unspecified: Secondary | ICD-10-CM | POA: Diagnosis not present

## 2023-02-07 DIAGNOSIS — F259 Schizoaffective disorder, unspecified: Secondary | ICD-10-CM | POA: Diagnosis not present

## 2023-02-07 DIAGNOSIS — Z888 Allergy status to other drugs, medicaments and biological substances status: Secondary | ICD-10-CM | POA: Diagnosis not present

## 2023-02-07 DIAGNOSIS — I509 Heart failure, unspecified: Secondary | ICD-10-CM

## 2023-02-07 DIAGNOSIS — J441 Chronic obstructive pulmonary disease with (acute) exacerbation: Secondary | ICD-10-CM | POA: Diagnosis not present

## 2023-02-07 DIAGNOSIS — E039 Hypothyroidism, unspecified: Secondary | ICD-10-CM | POA: Diagnosis not present

## 2023-02-07 DIAGNOSIS — Z885 Allergy status to narcotic agent status: Secondary | ICD-10-CM | POA: Diagnosis not present

## 2023-02-07 DIAGNOSIS — F314 Bipolar disorder, current episode depressed, severe, without psychotic features: Secondary | ICD-10-CM | POA: Diagnosis present

## 2023-02-07 DIAGNOSIS — I959 Hypotension, unspecified: Secondary | ICD-10-CM | POA: Diagnosis not present

## 2023-02-07 DIAGNOSIS — I5022 Chronic systolic (congestive) heart failure: Secondary | ICD-10-CM | POA: Diagnosis not present

## 2023-02-07 DIAGNOSIS — E1165 Type 2 diabetes mellitus with hyperglycemia: Secondary | ICD-10-CM | POA: Diagnosis present

## 2023-02-07 DIAGNOSIS — I5023 Acute on chronic systolic (congestive) heart failure: Secondary | ICD-10-CM | POA: Diagnosis not present

## 2023-02-07 DIAGNOSIS — G8929 Other chronic pain: Secondary | ICD-10-CM | POA: Diagnosis present

## 2023-02-07 DIAGNOSIS — Z1152 Encounter for screening for COVID-19: Secondary | ICD-10-CM | POA: Diagnosis not present

## 2023-02-07 DIAGNOSIS — E119 Type 2 diabetes mellitus without complications: Secondary | ICD-10-CM | POA: Diagnosis not present

## 2023-02-07 DIAGNOSIS — E785 Hyperlipidemia, unspecified: Secondary | ICD-10-CM | POA: Diagnosis present

## 2023-02-07 DIAGNOSIS — E739 Lactose intolerance, unspecified: Secondary | ICD-10-CM | POA: Diagnosis present

## 2023-02-07 DIAGNOSIS — I5020 Unspecified systolic (congestive) heart failure: Secondary | ICD-10-CM | POA: Diagnosis not present

## 2023-02-07 DIAGNOSIS — R45851 Suicidal ideations: Secondary | ICD-10-CM | POA: Diagnosis not present

## 2023-02-07 DIAGNOSIS — I110 Hypertensive heart disease with heart failure: Secondary | ICD-10-CM | POA: Diagnosis not present

## 2023-02-07 DIAGNOSIS — I428 Other cardiomyopathies: Secondary | ICD-10-CM | POA: Diagnosis not present

## 2023-02-07 DIAGNOSIS — E861 Hypovolemia: Secondary | ICD-10-CM | POA: Diagnosis not present

## 2023-02-07 DIAGNOSIS — E1169 Type 2 diabetes mellitus with other specified complication: Secondary | ICD-10-CM | POA: Diagnosis not present

## 2023-02-07 DIAGNOSIS — N189 Chronic kidney disease, unspecified: Secondary | ICD-10-CM | POA: Diagnosis present

## 2023-02-07 DIAGNOSIS — N179 Acute kidney failure, unspecified: Secondary | ICD-10-CM | POA: Diagnosis not present

## 2023-02-07 DIAGNOSIS — Z79899 Other long term (current) drug therapy: Secondary | ICD-10-CM | POA: Diagnosis not present

## 2023-02-07 DIAGNOSIS — J42 Unspecified chronic bronchitis: Secondary | ICD-10-CM | POA: Diagnosis not present

## 2023-02-07 LAB — IRON AND TIBC
Iron: 36 ug/dL (ref 28–170)
Saturation Ratios: 9 % — ABNORMAL LOW (ref 10.4–31.8)
TIBC: 410 ug/dL (ref 250–450)
UIBC: 374 ug/dL

## 2023-02-07 LAB — ECHOCARDIOGRAM COMPLETE
Area-P 1/2: 5.09 cm2
Height: 65 in
MV M vel: 4.44 m/s
MV Peak grad: 78.9 mmHg
S' Lateral: 6.7 cm
Weight: 2713.6 oz

## 2023-02-07 LAB — GLUCOSE, CAPILLARY
Glucose-Capillary: 301 mg/dL — ABNORMAL HIGH (ref 70–99)
Glucose-Capillary: 193 mg/dL — ABNORMAL HIGH (ref 70–99)
Glucose-Capillary: 328 mg/dL — ABNORMAL HIGH (ref 70–99)
Glucose-Capillary: 489 mg/dL — ABNORMAL HIGH (ref 70–99)

## 2023-02-07 LAB — MAGNESIUM: Magnesium: 2.7 mg/dL — ABNORMAL HIGH (ref 1.7–2.4)

## 2023-02-07 LAB — BASIC METABOLIC PANEL
Anion gap: 13 (ref 5–15)
BUN: 22 mg/dL — ABNORMAL HIGH (ref 6–20)
CO2: 27 mmol/L (ref 22–32)
Calcium: 8.4 mg/dL — ABNORMAL LOW (ref 8.9–10.3)
Chloride: 96 mmol/L — ABNORMAL LOW (ref 98–111)
Creatinine, Ser: 1.26 mg/dL — ABNORMAL HIGH (ref 0.44–1.00)
GFR, Estimated: 51 mL/min — ABNORMAL LOW (ref >60)
Glucose, Bld: 389 mg/dL — ABNORMAL HIGH (ref 70–99)
Potassium: 4.3 mmol/L (ref 3.5–5.1)
Sodium: 136 mmol/L (ref 135–145)

## 2023-02-07 LAB — CBC
HCT: 34.9 % — ABNORMAL LOW (ref 36.0–46.0)
Hemoglobin: 11.2 g/dL — ABNORMAL LOW (ref 12.0–15.0)
MCH: 28.2 pg (ref 26.0–34.0)
MCHC: 32.1 g/dL (ref 30.0–36.0)
MCV: 87.9 fL (ref 80.0–100.0)
Platelets: 184 10*3/uL (ref 150–400)
RBC: 3.97 MIL/uL (ref 3.87–5.11)
RDW: 15.9 % — ABNORMAL HIGH (ref 11.5–15.5)
WBC: 5.2 10*3/uL (ref 4.0–10.5)
nRBC: 0.4 % — ABNORMAL HIGH (ref 0.0–0.2)

## 2023-02-07 LAB — HEMOGLOBIN A1C
Hgb A1c MFr Bld: 8.4 % — ABNORMAL HIGH (ref 4.8–5.6)
Mean Plasma Glucose: 194.38 mg/dL

## 2023-02-07 LAB — T4, FREE
Free T4: 0.92 ng/dL (ref 0.61–1.12)
Free T4: 0.97 ng/dL (ref 0.61–1.12)

## 2023-02-07 LAB — VITAMIN D 25 HYDROXY (VIT D DEFICIENCY, FRACTURES): Vit D, 25-Hydroxy: 21.5 ng/mL — ABNORMAL LOW (ref 30–100)

## 2023-02-07 LAB — VITAMIN B12: Vitamin B-12: 333 pg/mL (ref 180–914)

## 2023-02-07 LAB — TSH: TSH: 0.229 u[IU]/mL — ABNORMAL LOW (ref 0.350–4.500)

## 2023-02-07 LAB — FERRITIN: Ferritin: 53 ng/mL (ref 11–307)

## 2023-02-07 MED ORDER — GLUCOSE 40 % PO GEL
1.0000 | ORAL | Status: DC
  Filled 2023-02-07: qty 1.21

## 2023-02-07 MED ORDER — HYDROMORPHONE HCL 1 MG/ML IJ SOLN: 1.0000 mg | INTRAMUSCULAR | Status: DC | PRN

## 2023-02-07 MED ORDER — FUROSEMIDE 10 MG/ML IJ SOLN
40.0000 mg | Freq: Once | INTRAMUSCULAR | Status: AC
  Administered 2023-02-07: 40 mg via INTRAVENOUS
  Filled 2023-02-07: qty 4

## 2023-02-07 MED ORDER — IRON SUCROSE 500 MG IVPB - SIMPLE MED
500.0000 mg | Freq: Once | INTRAVENOUS | Status: AC
  Administered 2023-02-07: 500 mg via INTRAVENOUS
  Filled 2023-02-07: qty 275

## 2023-02-07 MED ORDER — MOMETASONE FURO-FORMOTEROL FUM 200-5 MCG/ACT IN AERO
2.0000 | INHALATION_SPRAY | Freq: Two times a day (BID) | RESPIRATORY_TRACT | Status: DC
  Filled 2023-02-07: qty 8.8

## 2023-02-07 MED ORDER — PERFLUTREN LIPID MICROSPHERE
1.0000 mL | INTRAVENOUS | Status: DC | PRN
  Administered 2023-02-07: 10 mL via INTRAVENOUS

## 2023-02-07 MED ORDER — HYDROMORPHONE HCL 1 MG/ML IJ SOLN
1.0000 mg | INTRAMUSCULAR | Status: AC | PRN
  Administered 2023-02-07 – 2023-02-08 (×4): 1 mg via INTRAVENOUS
  Filled 2023-02-07 (×4): qty 1

## 2023-02-07 MED ORDER — INSULIN ASPART 100 UNIT/ML IJ SOLN
3.0000 [IU] | Freq: Once | INTRAMUSCULAR | Status: AC
  Administered 2023-02-07: 3 [IU] via SUBCUTANEOUS

## 2023-02-07 MED ORDER — VITAMIN D 25 MCG (1000 UNIT) PO TABS
1000.0000 [IU] | ORAL_TABLET | Freq: Every day | ORAL | Status: DC
  Administered 2023-02-07 – 2023-02-12 (×6): 1000 [IU] via ORAL
  Filled 2023-02-07 (×6): qty 1

## 2023-02-07 MED ORDER — LIDOCAINE 5 % EX OINT
TOPICAL_OINTMENT | Freq: Four times a day (QID) | CUTANEOUS | Status: DC | PRN
  Filled 2023-02-07: qty 35.44

## 2023-02-07 NOTE — Progress Notes (Signed)
  Echocardiogram 2D Echocardiogram has been performed.  Leda Roys RDCS 02/07/2023, 9:09 AM

## 2023-02-07 NOTE — Plan of Care (Signed)
  Problem: Education: Goal: Individualized Educational Video(s) Outcome: Progressing   Problem: Coping: Goal: Ability to adjust to condition or change in health will improve Outcome: Progressing   Problem: Fluid Volume: Goal: Ability to maintain a balanced intake and output will improve Outcome: Progressing   

## 2023-02-07 NOTE — Progress Notes (Signed)
H&V Care Navigation CSW Progress Note  Clinical Social Worker met with pt and provided with new phone so she can contact providers at [place]- new number [phone] added to chart  Patient is participating in a Managed Medicaid Plan:  Yes  SDOH Screenings   Food Insecurity: Food Insecurity Present (02/06/2023)  Housing: High Risk (02/06/2023)  Transportation Needs: Unmet Transportation Needs (02/06/2023)  Utilities: Not At Risk (02/06/2023)  Alcohol Screen: Low Risk  (12/18/2018)  Depression (PHQ2-9): High Risk (09/30/2022)  Financial Resource Strain: High Risk (09/30/2022)  Social Connections: Unknown (10/19/2021)   Received from [facility], [facility]  Stress: No Stress Concern Present (01/07/2022)   Received from [facility], [facility]  Tobacco Use: High Risk (02/06/2023)      Burna Sis, LCSW Clinical Social Worker [facility] Desk#: [phone] Cell#: [phone]

## 2023-02-07 NOTE — TOC Initial Note (Addendum)
Transition of Care [facility]) - Initial/Assessment Note    Patient Details  Name: [name] [name] MRN: [id number] Date of Birth: Oct 18, 1970  [facility] CM/SW Contact:    Gala Lewandowsky, RN Phone Number: 02/07/2023, 3:15 PM  Clinical Narrative: Patient presented for shortness of breath and chest pain. Patient reports that she a history of homelessness; however, is currently staying with a friend in an apartment. Patient does not have a PCP- uses [facility]. Case Manager discussed PCP needs and the patient declines for Case Manager to arrange an appointment due to she states she has issues with transportation and she will not utilize the bus. Case Manager educated the patient regarding the importance of having a PCP. CSW will be providing transportation resources to the patient. No further needs identified at this time.             02-07-23 1557 CSW offered food resources and the patient declined the resources.   Expected Discharge Plan: Home/Self Care Barriers to Discharge: Continued Medical Work up   Patient Goals and CMS Choice     Choice offered to / list presented to : NA  Expected Discharge Plan and Services In-house Referral: Clinical Social Work Discharge Planning Services: CM Consult Post Acute Care Choice: NA Living arrangements for the past 2 months: Apartment                   DME Agency: NA       HH Arranged: NA  Prior Living Arrangements/Services Living arrangements for the past 2 months: Apartment Lives with:: Friends Patient language and need for interpreter reviewed:: Yes Do you feel safe going back to the place where you live?: Yes      Need for Family Participation in Patient Care: Yes (Comment) Care giver support system in place?: Yes (comment)   Criminal Activity/Legal Involvement Pertinent to Current Situation/Hospitalization: No - Comment as needed  Activities of Daily Living Home Assistive Devices/Equipment:  CBG Meter, Scales ADL Screening (condition at time of admission) Patient's cognitive ability adequate to safely complete daily activities?: Yes Is the patient deaf or have difficulty hearing?: No Does the patient have difficulty seeing, even when wearing glasses/contacts?: Yes (blurry vision) Does the patient have difficulty concentrating, remembering, or making decisions?: Yes (forgetful) Patient able to express need for assistance with ADLs?: Yes Does the patient have difficulty dressing or bathing?: No Independently performs ADLs?: Yes (appropriate for developmental age) Communication: Independent Dressing (OT): Independent Grooming: Independent Feeding: Independent Bathing: Independent Toileting: Independent In/Out Bed: Independent Walks in Home: Independent Does the patient have difficulty walking or climbing stairs?: No Weakness of Legs: None Weakness of Arms/Hands: None  Permission Sought/Granted Permission sought to share information with : Case Manager   Emotional Assessment Appearance:: Appears stated age Attitude/Demeanor/Rapport: Engaged Affect (typically observed): Appropriate Orientation: : Oriented to Self, Oriented to Place, Oriented to  Time Alcohol / Substance Use: Not Applicable Psych Involvement: No (comment)  Admission diagnosis:  CHF exacerbation (HCC) [I50.9] Dyspnea, unspecified type [R06.00] Patient Active Problem List   Diagnosis Date Noted   CHF exacerbation (HCC) 02/06/2023   COPD exacerbation (HCC) 09/23/2022   Pressure injury of skin 09/23/2022   Acute exacerbation of chronic obstructive pulmonary disease (COPD) (HCC) 09/17/2022   Prolonged QT interval 09/17/2022   Severe mitral regurgitation 09/17/2022   CKD stage 3a, GFR 45-59 ml/min (HCC) 09/17/2022   Hyperglycemia due to diabetes mellitus (HCC) 08/30/2022   Hyperthyroidism 08/29/2022   Hyperglycemia 08/29/2022  NSTEMI (non-ST elevated myocardial infarction) (HCC) 08/27/2022   AKI  (acute kidney injury) (HCC) 08/21/2022   Superficial vein thrombosis 08/06/2022   Anxiety 08/05/2022   Heart failure (HCC) 08/04/2022   COPD (chronic obstructive pulmonary disease) (HCC) 08/04/2022   Acute kidney injury superimposed on chronic kidney disease (HCC) 08/04/2022   Chronic systolic CHF (congestive heart failure) (HCC) 08/03/2022   Bronchitis 08/03/2022   CHF (congestive heart failure) (HCC) 08/03/2022   Acute on chronic systolic CHF (congestive heart failure) (HCC) 05/15/2022   Acute pyelonephritis 07/17/2020   Homelessness 06/12/2020   Bipolar 1 disorder (HCC) 12/18/2018   Suicide attempt (HCC)    Overdose, intentional self-harm, initial encounter (HCC) 12/13/2018   Bipolar I disorder, single manic episode, severe, with psychosis (HCC) 06/09/2018   Bipolar disorder (HCC) 06/09/2018   DKA, type 2 (HCC) 01/06/2018   Diabetic acidosis without coma (HCC)    Adjustment disorder with depressed mood 01/04/2018   Oral thrush    Acute lower UTI    DM2 (diabetes mellitus, type 2) (HCC) 01/03/2018   Thrush 01/03/2018   DM (diabetes mellitus), type 2, uncontrolled 06/07/2017   Abscess of groin, right 06/07/2017   Abscess 06/07/2017   Bipolar disorder with moderate depression (HCC) 08/31/2015   Tobacco use disorder 08/28/2015   HTN (hypertension) 08/28/2015   Type 2 diabetes mellitus with hyperlipidemia (HCC) 08/28/2015   Hyponatremia 08/28/2015   T2DM (type 2 diabetes mellitus) (HCC) 10/26/2009   OBESITY 10/26/2009   Essential hypertension 10/26/2009   DISTURBANCE OF SKIN SENSATION 10/26/2009   Migraine headache 03/05/2007   PCP:  [facility]:   [facility] [address] [place] [address] Phone: [phone] Fax: [phone]  Social Determinants of Health (SDOH) Social History: SDOH Screenings   Food Insecurity: Food Insecurity Present (02/06/2023)  Housing: High Risk (02/06/2023)  Transportation  Needs: Unmet Transportation Needs (02/06/2023)  Utilities: Not At Risk (02/06/2023)  Alcohol Screen: Low Risk  (12/18/2018)  Depression (PHQ2-9): High Risk (09/30/2022)  Financial Resource Strain: High Risk (09/30/2022)  Social Connections: Unknown (10/19/2021)   Received from [facility]  Stress: No Stress Concern Present (01/07/2022)   Received from [facility], [facility]  Tobacco Use: High Risk (02/06/2023)    Readmission Risk Interventions    09/02/2022    2:41 PM 08/28/2022    3:52 PM 08/22/2022    3:19 PM  Readmission Risk Prevention Plan  Transportation Screening Complete Complete Complete  Medication Review Oceanographer) Complete Complete Complete  PCP or Specialist appointment within 3-5 days of discharge Complete Complete Complete  HRI or Home Care Consult Complete Complete Complete  SW Recovery Care/Counseling Consult --  Complete  Palliative Care Screening Not Applicable Not Applicable Not Applicable  Skilled Nursing Facility Not Applicable Not Applicable Not Applicable

## 2023-02-07 NOTE — Progress Notes (Addendum)
Subjective: [name] [name] is a 52 yo F with PMH significant for HFrEF (20-25% March 2024), nonischemic cardiomyopathy, COPD, T2DM, schizoaffective disorder, and bipolar 1 disorder who presented for shortness of breath and chest pain and admitted for workup ofor HFrEF exacerbation and COPD exacerbation.   No acute events overnight.   Patient examined at bedside this morning in the process of getting an EKG because she was reporting 9/10 chest pain. She reports chest pain is now 9/10 (8/10 on admission) but the chest pain feels the same otherwise. She reports no worsening of the chest pain when she got out of bed to use the restroom. She reports not sleeping well the night before because the chest pain was constant and her home dose of oxy 10mg  q6hr PRN did not help. She took 4 nitroglycerins yesterday that did not relieve the chest pain. Chest pain does not worsen on movement. EKG in the room without acute findings, unchanged from prior.   Patient had a BM today -- notes some bleeding in setting of hemorrhoids. Has continuing back pain. Patient ran out of medications in June. Receives GDMT med refills at [facility] when she sees her cardiologist. She receives other medications upon hospital discharge. She does not have a PCP.   Objective:  Vital signs in last 24 hours: Vitals:   02/06/23 2018 02/07/23 0347 02/07/23 0728 02/07/23 1152  BP:  (!) 97/55 109/65 102/69  Pulse: 96 86 88 84  Resp: 18 19 15 17   Temp:  98.2 F (36.8 C) 98.2 F (36.8 C)   TempSrc:  Oral Oral   SpO2: 95% 95% 96%   Weight:  76.9 kg    Height:       Weight change:   Intake/Output Summary (Last 24 hours) at 02/07/2023 1422 Last data filed at 02/07/2023 0800 Gross per 24 hour  Intake 360 ml  Output 500 ml  Net -140 ml   Physical Exam: General: NAD, alert and oriented, complaining of pain and tearful  HEENT: NCAT, vision grossly intact Neck: Supple, trachea midline, JVD with +hepatojugular reflux  Lungs:  normal respiratory effort on room air, no crackles, mild diffuse wheezing decreased from yesterday. Heart: RRR, no M/R/G.  Chest: reports chest wall tenderness to palpation, no gross abnormality on exam.  Abdomen: Soft, NTND.  Extremities: MAE. 1+ pitting edema in bilateral LE. Right calf nontender to calf squeeze. No erythema in lower extremities.  Neuro: alert and oriented, nonfocal.  Skin: warm, dry, good turgor.  Psych: cooperative, appropriate mood and affect.   ECHOCARDIOGRAM COMPLETE  Result Date: 02/07/2023    ECHOCARDIOGRAM REPORT   Patient Name:   [name] [name] Date of Exam: 02/07/2023 Medical Rec #:  [id number]       Height:       65.0 in Accession #:    [id number]      Weight:       169.6 lb Date of Birth:  05-02-1971       BSA:          1.844 m Patient Age:    52 years        BP:           109/65 mmHg Patient Gender: F               HR:           77 bpm. Exam Location:  Inpatient Procedure: 2D Echo, Cardiac Doppler, Color Doppler and Intracardiac  Opacification Agent Indications:    CHF I50.9  History:        Patient has prior history of Echocardiogram examinations, most                 recent 08/27/2022. CHF, COPD; Risk Factors:Diabetes.  Sonographer:    Harriette Bouillon RDCS Referring Phys: [id number] EMILY B MULLEN IMPRESSIONS  1. No apical thrombus with Definity contrast. Left ventricular ejection fraction, by estimation, is 20 to 25%. The left ventricle has severely decreased function. The left ventricle demonstrates global hypokinesis. The left ventricular internal cavity size was severely dilated. There is mild left ventricular hypertrophy. Left ventricular diastolic parameters are consistent with Grade II diastolic dysfunction (pseudonormalization). Elevated left ventricular end-diastolic pressure. The E/e' is 39.  2. Right ventricular systolic function is normal. The right ventricular size is normal. Tricuspid regurgitation signal is inadequate for assessing PA pressure.  3. Left  atrial size was severely dilated.  4. The mitral valve is abnormal. Moderate mitral valve regurgitation.  5. The aortic valve is tricuspid. Aortic valve regurgitation is not visualized.  6. The inferior vena cava is dilated in size with >50% respiratory variability, suggesting right atrial pressure of 8 mmHg. Comparison(s): Changes from prior study are noted. 08/27/2022: LVEF 20-25%. FINDINGS  Left Ventricle: No apical thrombus with Definity contrast. Left ventricular ejection fraction, by estimation, is 20 to 25%. The left ventricle has severely decreased function. The left ventricle demonstrates global hypokinesis. Definity contrast agent was given IV to delineate the left ventricular endocardial borders. The left ventricular internal cavity size was severely dilated. There is mild left ventricular hypertrophy. Left ventricular diastolic parameters are consistent with Grade II diastolic dysfunction (pseudonormalization). Elevated left ventricular end-diastolic pressure. The E/e' is 79. Right Ventricle: The right ventricular size is normal. No increase in right ventricular wall thickness. Right ventricular systolic function is normal. Tricuspid regurgitation signal is inadequate for assessing PA pressure. Left Atrium: Left atrial size was severely dilated. Right Atrium: Right atrial size was normal in size. Pericardium: Trivial pericardial effusion is present. The pericardial effusion is circumferential. Mitral Valve: The mitral valve is abnormal. There is mild thickening of the anterior and posterior mitral valve leaflet(s). Moderate mitral valve regurgitation, with centrally-directed jet. Tricuspid Valve: The tricuspid valve is grossly normal. Tricuspid valve regurgitation is trivial. Aortic Valve: The aortic valve is tricuspid. Aortic valve regurgitation is not visualized. Pulmonic Valve: The pulmonic valve was grossly normal. Pulmonic valve regurgitation is trivial. Aorta: The aortic root and ascending aorta are  structurally normal, with no evidence of dilitation. Venous: The inferior vena cava is dilated in size with greater than 50% respiratory variability, suggesting right atrial pressure of 8 mmHg. IAS/Shunts: No atrial level shunt detected by color flow Doppler.  LEFT VENTRICLE PLAX 2D LVIDd:         7.20 cm   Diastology LVIDs:         6.70 cm   LV e' medial:    2.89 cm/s LV PW:         1.10 cm   LV E/e' medial:  39.1 LV IVS:        1.00 cm   LV e' lateral:   2.89 cm/s LVOT diam:     2.10 cm   LV E/e' lateral: 39.1 LV SV:         46 LV SV Index:   25 LVOT Area:     3.46 cm  RIGHT VENTRICLE  IVC RV S prime:     12.20 cm/s  IVC diam: 2.80 cm TAPSE (M-mode): 2.1 cm LEFT ATRIUM              Index        RIGHT ATRIUM           Index LA diam:        5.80 cm  3.14 cm/m   RA Area:     13.70 cm LA Vol (A2C):   139.0 ml 75.37 ml/m  RA Volume:   33.30 ml  18.06 ml/m LA Vol (A4C):   99.5 ml  53.95 ml/m LA Biplane Vol: 117.0 ml 63.44 ml/m  AORTIC VALVE LVOT Vmax:   64.90 cm/s LVOT Vmean:  46.300 cm/s LVOT VTI:    0.132 m  AORTA Ao Root diam: 3.20 cm Ao Asc diam:  3.10 cm MITRAL VALVE MV Area (PHT): 5.09 cm     SHUNTS MV Decel Time: 149 msec     Systemic VTI:  0.13 m MR Peak grad: 78.9 mmHg     Systemic Diam: 2.10 cm MR Mean grad: 56.0 mmHg MR Vmax:      444.00 cm/s MR Vmean:     363.0 cm/s MV E velocity: 113.00 cm/s MV A velocity: 70.20 cm/s MV E/A ratio:  1.61 Zoila Shutter MD Electronically signed by Zoila Shutter MD Signature Date/Time: 02/07/2023/11:10:44 AM    Final      Assessment/Plan:  Principal Problem:   CHF exacerbation (HCC) Active Problems:   Type 2 diabetes mellitus with hyperlipidemia (HCC)   Bipolar disorder with moderate depression (HCC)   COPD (chronic obstructive pulmonary disease) (HCC)   Acute kidney injury superimposed on chronic kidney disease (HCC)  #Chest pain  Chest pain is likely noncardiogenic given atypical nature of chest pain at rest, not worsened with exertion, not  relieved by nitroglycerin. Chest pain exacerbated by palpation and deep inspiration. Not exacerbated by cough or change in position. She has a history of nonischemic cardiomyopathy. Trop mildly elevated but flat (32,25). EKGs without ischemic changes.  -repeat EKGs without acute findings. Of note, prolonged Qtc present on previous EKGs as well. -Continue cardiac telemetry -Pain control: lidocaine patch to chest, Oxy 10mg  q6h PRN, Dilauded q4hr PRN for 4 doses for severe pain, and lidocaine gel  -Bowel regimen: Senna BID and Miralax once daily   #HFrEF exacerbation  #HTN Patient has a history of HFrEF (EF 20-25% March 2024) and she has not taken any of her medications since June. Follow up Echo today LVEF 20-25%, mitral valve regurgitation is now moderate (severe regurg in March), and left atrium is severely dilated (normal size in March).  -Gave one dose IV Lasix overnight, poor documented output -Gave another dose IV lasix this AM with poor output, will give another dose lasix  -Hold Losartan, Spironolactone, Jardiance, and Bumetanide overnight while diuresing with Lasix -Discontinue Toprol in setting of HFrEF exacerbation -Blood pressure normotensive -Check daily weights, Is/Os  #AKI  Baseline Cr 0.8-0.9. Cr 1.26 this morning from 0.70 last night. Could be cardiorenal considering HFrEF exacerbation from not taking medications causing poor perfusion of kidneys. Betablocker started overnight in setting of HFrEF exacerbation could have worsened kidney perfusion as well. -Continue to IV Lasix, discontinued Toprol.    #COPD exacerbation  COPD exacerbation less likely as wheezing significantly improved today. Patient has a cough however no change in sputum. HFrEF exacerbation is more likely cause of her SOB. Negative for flu and covid.  -Restarted home inhalers  -  Discontinue prednisone in setting of unlikely COPD exacerbation  #T2DM Patient discharged on Toujeo 24units at bedtime but she ran  out of it in June. Started Semglee 24units at bedtime yesterday. Her glucose was 496 overnight, no AG, given 7 units. Of note, she did get prednisone last night. Glucose improved today and prednisone discontinued which will allow glucose to be less hyperglycemic.  -A1C 8.4 from 11 in March.  -Continue Semglee 24units at bedtime and SSI.    #History of chronic low back pain Patient reports that she has chronic low back pain that she takes Oxy 10mg  q6h for. She has not run out of this medication at home.  -Continue home Oxycodone 10mg  q6h PRN  -Bowel regimen: senna BID, miralax once daily   #Schizoaffective disorder #Bipolar 1 Disorder Patient was on risperidone 1mg  in the morning and 2mg  at bedtime at last discharge.  -Continue risperidone    #Hyperlipidemia Continue Crestor 20mg  once daily   #Possible hyperthyroidism Possible hyperthyroidism per March admission. Patient had labs indicating hyperthyroidism and was discharged on methimazole for 21 days.  -TSH 0.229, free T4 0.97, T3 pending -Hold off on methimazole for now until T3 results   #IDA Iron study indicates iron deficency anema.  - One dose IV iron today.   #Hypomagnesemia Mg 1.7 on admission and repleted. Resolved.   #Right lower leg pain  Right calf tender on calf squeeze on admission. Patient nontender today. No erythema of RLE. Patient declined Doppler.   Best Practice: Diet: normal IVF: none, fluid restriction of  VTE: Xarelto  AB: none  Therapy Recs: PT/OT eval pending  Family Contact: Son, Judie Grieve, is healthcare POA   Prior to Admission Living Arrangement: Home Anticipated Discharge Location: Home Barriers to Discharge: chest pain and HFrEF exacerbation  Dispo: anticipated discharge in 1-3 days to home   LOS: 1 day   Lang Snow, Medical Student 02/07/2023, 2:22 PM

## 2023-02-07 NOTE — Plan of Care (Signed)
  Problem: Education: Goal: Ability to describe self-care measures that may prevent or decrease complications (Diabetes Survival Skills Education) will improve Outcome: Progressing Goal: Individualized Educational Video(s) Outcome: Progressing   Problem: Coping: Goal: Ability to adjust to condition or change in health will improve Outcome: Progressing   

## 2023-02-07 NOTE — Evaluation (Signed)
Physical Therapy Evaluation Patient Details Name: [name] [name] MRN: [id number] DOB: July 26, 1970 Today's Date: 02/07/2023  History of Present Illness  52 y/o female admitted to [facility] 8/29 with complaints of SOB x 3 days, 5-7 pounds of weight gain, B LE edema and chest pain. with acute exacerbation of COPD. Diagnosed with CHF exacerbation and HFrEF exacerbation. Intubated 4/10-4/13 for acute exacerbation of COPD and acute respiratory failure. PMHx: HFrEF, nonischemic cardiomyopathy, severe MR, schizoaffective disorder, bipolar disorder, COPD/asthma, cigarette smoker, IIDM, chronic LBP.  Clinical Impression   Pt presents with generalized weakness, impaired mobility tolerance given chest discomfort suspect due to fluid overload. Pt to benefit from acute PT to address deficits. Pt tolerated moving to/from EOB only this date, declines any OOB mobility stating she wants to rest and will not get up later either. PT anticipates pt is close to baseline, but unable to get an accurate picture of pt balance, strength, mobility, and activity tolerance this date. PT to progress mobility as tolerated, and will continue to follow acutely.          If plan is discharge home, recommend the following: A little help with bathing/dressing/bathroom   Can travel by private vehicle        Equipment Recommendations None recommended by PT  Recommendations for Other Services       Functional Status Assessment Patient has had a recent decline in their functional status and demonstrates the ability to make significant improvements in function in a reasonable and predictable amount of time.     Precautions / Restrictions Precautions Precautions: Other (comment) Precaution Comments: per pt and RN, pt has been mobilizing ad lib. Also, there was low suspicion for RLE DVT, pt declined LE [place], so order d/ced prior to time of eval Restrictions Weight Bearing Restrictions: No      Mobility  Bed Mobility Overal bed  mobility: Modified Independent             General bed mobility comments: use of bedrails    Transfers                   General transfer comment: pt declines    Ambulation/Gait                  Stairs            Wheelchair Mobility     Tilt Bed    Modified Rankin (Stroke Patients Only)       Balance Overall balance assessment: Needs assistance Sitting-balance support: No upper extremity supported, Single extremity supported Sitting balance-Leahy Scale: Good Sitting balance - Comments: sitting EOB       Standing balance comment: Pt declining sit/stand transfers and OOB activities this session.                             Pertinent Vitals/Pain Pain Assessment Pain Assessment: Faces Faces Pain Scale: Hurts little more Pain Location: chest radiating to abdomen Pain Descriptors / Indicators: Discomfort Pain Intervention(s): Limited activity within patient's tolerance, Monitored during session, Repositioned    Home Living Family/patient expects to be discharged to:: Private residence Living Arrangements: Non-relatives/Friends Available Help at Discharge: Other (Comment) (Limited assistance) Type of Home: Apartment Home Access: Level entry       Home Layout: One level Home Equipment: None Additional Comments: Pt with history of experiencing homelessness.    Prior Function Prior Level of Function : Independent/Modified Independent  Mobility Comments: Independnent ADLs Comments: Independemt with ADLs and IADLs, does not drive     Extremity/Trunk Assessment   Upper Extremity Assessment Upper Extremity Assessment: Defer to OT evaluation    Lower Extremity Assessment Lower Extremity Assessment: Generalized weakness (grossly 4/5)    Cervical / Trunk Assessment Cervical / Trunk Assessment: Normal  Communication   Communication Communication: No apparent difficulties Cueing Techniques: Verbal  cues;Gestural cues  Cognition Arousal: Lethargic Behavior During Therapy: Lability, Anxious, Restless Overall Cognitive Status: No family/caregiver present to determine baseline cognitive functioning                                 General Comments: difficult to , perseverative on chest pain        General Comments General comments (skin integrity, edema, etc.): Pt participation limited this session secondary pain, anxiousness, and emotional lability. With increased participation, pt has good rehab potential. VSS on RA from bed level during session.    Exercises     Assessment/Plan    PT Assessment Patient needs continued PT services  PT Problem List Decreased strength;Decreased mobility;Decreased activity tolerance;Cardiopulmonary status limiting activity       PT Treatment Interventions Therapeutic activities;Gait training;Therapeutic exercise;Patient/family education;Balance training;Functional mobility training;Neuromuscular re-education    PT Goals (Current goals can be found in the Care Plan section)  Acute Rehab PT Goals PT Goal Formulation: With patient Time For Goal Achievement: 02/21/23 Potential to Achieve Goals: Good    Frequency Min 1X/week     Co-evaluation               AM-PAC PT "6 Clicks" Mobility  Outcome Measure Help needed turning from your back to your side while in a flat bed without using bedrails?: None Help needed moving from lying on your back to sitting on the side of a flat bed without using bedrails?: None Help needed moving to and from a bed to a chair (including a wheelchair)?: A Little Help needed standing up from a chair using your arms (e.g., wheelchair or bedside chair)?: A Little Help needed to walk in hospital room?: A Little Help needed climbing 3-5 steps with a railing? : A Little 6 Click Score: 20    End of Session   Activity Tolerance: Patient tolerated treatment well Patient left: in bed;with call  bell/phone within reach Nurse Communication: Mobility status PT Visit Diagnosis: Other abnormalities of gait and mobility (R26.89)    Time: 1105-1120 PT Time Calculation (min) (ACUTE ONLY): 15 min   Charges:   PT Evaluation $PT Eval Low Complexity: 1 Low   PT General Charges $$ ACUTE PT VISIT: 1 Visit         Marye Round, PT DPT Acute Rehabilitation Services Secure Chat Preferred  Office [phone]   Maya Arcand E Stroup 02/07/2023, 1:24 PM

## 2023-02-07 NOTE — Evaluation (Signed)
Occupational Therapy Evaluation Patient Details Name: [name] [name] MRN: [id number] DOB: 08-06-1970 Today's Date: 02/07/2023   History of Present Illness 52 y/o female admitted to [facility] 8/29 with complaints of SOB x 3 days, 5-7 pounds of weight gain, B LE edema and chest pain. with acute exacerbation of COPD. Diagnosed with CHF exacerbation and HFrEF exacerbation. Intubated 4/10-4/13 for acute exacerbation of COPD and acute respiratory failure. PMHx: HFrEF, nonischemic cardiomyopathy, severe MR, schizoaffective disorder, bipolar disorder, COPD/asthma, cigarette smoker, IIDM, chronic LBP.   Clinical Impression   At baseline, pt in Independent with ADLs, IADLs, and functional mobility without an AD. Pt now presents with decreased activity tolerance, signs of anxiety and lability, pain, and decreased safety and independence with ADLs. OT eval limited due to pt declining sitting EOB and OOB activity this session. However, pt participated well from bed level and demonstrated B UE strength, ROM, and coordination WFL. Pt also demonstrates ability to follow 2 to 3-step commands consistently and sequence tasks appropriately. Pt currently demonstrates ability to complete UB ADLs with Independent to Set up from bed level, LB ADLs with Contact guard to Min assist from bed level, and bed mobility Independent. Pt will benefit from acute skilled OT services to address deficits outlined below and increase safety and independence with ADLs, functional transfers, and functional mobility. With increased participation, pt has good rehab potential. Post acute discharge, no OT follow up indicated at this time.       If plan is discharge home, recommend the following: A little help with walking and/or transfers;A little help with bathing/dressing/bathroom;Assistance with cooking/housework;Assist for transportation;Help with stairs or ramp for entrance    Functional Status Assessment  Patient has had a recent decline in  their functional status and demonstrates the ability to make significant improvements in function in a reasonable and predictable amount of time.  Equipment Recommendations  Other (comment) (will furhter assess next session)    Recommendations for Other Services       Precautions / Restrictions Precautions Precautions: Fall Restrictions Weight Bearing Restrictions: No      Mobility Bed Mobility Overal bed mobility: Independent             General bed mobility comments: Pt repositioning self in bed and coming to long sit Independently in bed multiple times during session.    Transfers                   General transfer comment: Unable to assess pt safety with transfers this session secondary to pt declining OOB activity this session. OT to further assess in future skilled OT session.      Balance Overall balance assessment: Needs assistance Sitting-balance support: No upper extremity supported, Single extremity supported Sitting balance-Leahy Scale: Good Sitting balance - Comments: in long sitting in bed       Standing balance comment: Pt declining sit/stand transfers and OOB activities this session.                           ADL either performed or assessed with clinical judgement   ADL Overall ADL's : Needs assistance/impaired Eating/Feeding: Independent;Bed level   Grooming: Set up;Bed level   Upper Body Bathing: Set up;Bed level   Lower Body Bathing: Contact guard assist;Bed level   Upper Body Dressing : Set up;Bed level   Lower Body Dressing: Minimal assistance;Bed level               Functional mobility  during ADLs:  (Pt decling funcitonal mobility this session. [place] this day, pt ambulated in room with assist of nursing staff.) General ADL Comments: Pt with decreased activity tolerance during tasks in bed. Assessment of ADLs limited to bed level secondary to pt declining sitting EOB or participating in OOB activity at this  time due to not feeling well, fatigue, and pain/pressure in chest.     Vision Baseline Vision/History: 0 No visual deficits Ability to See in Adequate Light: 0 Adequate Patient Visual Report: No change from baseline       Perception         Praxis         Pertinent Vitals/Pain Pain Assessment Pain Assessment: 0-10 Pain Score: 9  Faces Pain Scale: Hurts a little bit Breathing: normal Pain Location: chest Pain Descriptors / Indicators: Pressure, Stabbing Pain Intervention(s): Limited activity within patient's tolerance, Monitored during session, Repositioned, Other (comment) (Notified RN pt reports chest pain and pressure)     Extremity/Trunk Assessment Upper Extremity Assessment Upper Extremity Assessment: Right hand dominant;Overall WFL for tasks assessed   Lower Extremity Assessment Lower Extremity Assessment: Defer to PT evaluation       Communication Communication Communication: No apparent difficulties   Cognition Arousal: Lethargic Behavior During Therapy: Lability, Anxious, Restless Overall Cognitive Status: No family/caregiver present to determine baseline cognitive functioning                                 General Comments: Pt present with emotional lability, signs of anxiety, and restless, but pleasant throughout session. Pt is AAOx4 and demonstrates ability to follow 2 and 3-step commands consistantly.     General Comments  Pt participation limited this session secondary pain, anxiousness, and emotional lability. With increased participation, pt has good rehab potential. VSS on RA from bed level during session.    Exercises     Shoulder Instructions      Home Living Family/patient expects to be discharged to:: Private residence Living Arrangements: Non-relatives/Friends Available Help at Discharge: Other (Comment) (Limited assistance) Type of Home: Apartment Home Access: Level entry     Home Layout: One level     Bathroom  Shower/Tub: Chief Strategy Officer: Standard     Home Equipment: None   Additional Comments: Pt with history of experiencing homelessness.      Prior Functioning/Environment Prior Level of Function : Independent/Modified Independent             Mobility Comments: Independnent ADLs Comments: Independemt with ADLs and IADLs, does not drive        OT Problem List: Decreased activity tolerance      OT Treatment/Interventions: Self-care/ADL training;Energy conservation;Therapeutic activities;Patient/family education;Therapeutic exercise    OT Goals(Current goals can be found in the care plan section) Acute Rehab OT Goals Patient Stated Goal: to feel better OT Goal Formulation: With patient Time For Goal Achievement: 02/21/23 Potential to Achieve Goals: Good ADL Goals Pt Will Perform Grooming: with modified independence;standing Pt Will Perform Lower Body Bathing: with modified independence;sit to/from stand Pt Will Perform Lower Body Dressing: with modified independence;sit to/from stand;sitting/lateral leans Pt Will Transfer to Toilet: with modified independence;ambulating;regular height toilet (with least restrictive AD) Pt Will Perform Toileting - Clothing Manipulation and hygiene: with modified independence;sitting/lateral leans;sit to/from stand  OT Frequency: Min 1X/week    Co-evaluation              AM-PAC OT "6 Clicks" Daily  Activity     Outcome Measure Help from another person eating meals?: None Help from another person taking care of personal grooming?: A Little Help from another person toileting, which includes using toliet, bedpan, or urinal?: A Little Help from another person bathing (including washing, rinsing, drying)?: A Little Help from another person to put on and taking off regular upper body clothing?: A Little Help from another person to put on and taking off regular lower body clothing?: A Little 6 Click Score: 19   End of  Session Nurse Communication: Mobility status;Other (comment) (Pt appropriate to participate in OT eval. Pt reports chest pain and pressure.)  Activity Tolerance: Patient limited by pain;Patient limited by fatigue;Other (comment) (Limited by anxiousness and emotional lability) Patient left: in bed;with call bell/phone within reach;with bed alarm set  OT Visit Diagnosis: Other (comment) (Decreased activity tolerance)                Time: 9528-4132 OT Time Calculation (min): 17 min Charges:  OT General Charges $OT Visit: 1 Visit OT Evaluation $OT Eval Low Complexity: 1 Low  Verl Whitmore "Orson Eva., OTR/L, MA Acute Rehab [phone]   Lendon Colonel 02/07/2023, 11:14 AM

## 2023-02-08 DIAGNOSIS — I509 Heart failure, unspecified: Secondary | ICD-10-CM | POA: Diagnosis not present

## 2023-02-08 DIAGNOSIS — F17210 Nicotine dependence, cigarettes, uncomplicated: Secondary | ICD-10-CM | POA: Diagnosis not present

## 2023-02-08 LAB — GLUCOSE, CAPILLARY
Glucose-Capillary: 167 mg/dL — ABNORMAL HIGH (ref 70–99)
Glucose-Capillary: 146 mg/dL — ABNORMAL HIGH (ref 70–99)
Glucose-Capillary: 119 mg/dL — ABNORMAL HIGH (ref 70–99)
Glucose-Capillary: 289 mg/dL — ABNORMAL HIGH (ref 70–99)

## 2023-02-08 LAB — BASIC METABOLIC PANEL
Anion gap: 14 (ref 5–15)
BUN: 31 mg/dL — ABNORMAL HIGH (ref 6–20)
CO2: 28 mmol/L (ref 22–32)
Calcium: 8.3 mg/dL — ABNORMAL LOW (ref 8.9–10.3)
Chloride: 96 mmol/L — ABNORMAL LOW (ref 98–111)
Creatinine, Ser: 1.04 mg/dL — ABNORMAL HIGH (ref 0.44–1.00)
GFR, Estimated: >60 mL/min (ref >60)
Glucose, Bld: 169 mg/dL — ABNORMAL HIGH (ref 70–99)
Potassium: 3.3 mmol/L — ABNORMAL LOW (ref 3.5–5.1)
Sodium: 138 mmol/L (ref 135–145)

## 2023-02-08 LAB — T3: T3, Total: 56 ng/dL — ABNORMAL LOW (ref 71–180)

## 2023-02-08 LAB — MAGNESIUM: Magnesium: 2 mg/dL (ref 1.7–2.4)

## 2023-02-08 MED ORDER — POTASSIUM CHLORIDE CRYS ER 20 MEQ PO TBCR
20.0000 meq | EXTENDED_RELEASE_TABLET | Freq: Once | ORAL | Status: AC
  Administered 2023-02-08: 20 meq via ORAL
  Filled 2023-02-08: qty 1

## 2023-02-08 MED ORDER — METOPROLOL SUCCINATE ER 25 MG PO TB24: 12.5000 mg | ORAL_TABLET | Freq: Every day | ORAL | Status: DC

## 2023-02-08 MED ORDER — FUROSEMIDE 10 MG/ML IJ SOLN
40.0000 mg | Freq: Once | INTRAMUSCULAR | Status: AC
  Administered 2023-02-08: 40 mg via INTRAVENOUS
  Filled 2023-02-08: qty 4

## 2023-02-08 MED ORDER — LOSARTAN POTASSIUM 25 MG PO TABS: 12.5000 mg | ORAL_TABLET | Freq: Every day | ORAL | Status: DC

## 2023-02-08 MED ORDER — EMPAGLIFLOZIN 10 MG PO TABS: 10.0000 mg | ORAL_TABLET | Freq: Every day | ORAL | Status: DC

## 2023-02-08 MED ORDER — POTASSIUM CHLORIDE CRYS ER 20 MEQ PO TBCR
40.0000 meq | EXTENDED_RELEASE_TABLET | Freq: Two times a day (BID) | ORAL | Status: AC
  Administered 2023-02-08 (×2): 40 meq via ORAL
  Filled 2023-02-08 (×2): qty 2

## 2023-02-08 MED ORDER — MOMETASONE FURO-FORMOTEROL FUM 200-5 MCG/ACT IN AERO
2.0000 | INHALATION_SPRAY | Freq: Two times a day (BID) | RESPIRATORY_TRACT | Status: DC
  Administered 2023-02-08 – 2023-02-12 (×8): 2 via RESPIRATORY_TRACT
  Filled 2023-02-08: qty 8.8

## 2023-02-08 MED ORDER — RISPERIDONE 2 MG PO TABS: 1.0000 mg | ORAL_TABLET | ORAL | Status: DC

## 2023-02-08 MED ORDER — SPIRONOLACTONE 25 MG PO TABS: 12.5000 mg | ORAL_TABLET | Freq: Every day | ORAL | Status: DC

## 2023-02-08 MED ORDER — MOMETASONE FURO-FORMOTEROL FUM 200-5 MCG/ACT IN AERO: 2.0000 | INHALATION_SPRAY | Freq: Two times a day (BID) | RESPIRATORY_TRACT | Status: DC

## 2023-02-08 MED ORDER — LORAZEPAM 1 MG PO TABS
1.0000 mg | ORAL_TABLET | Freq: Once | ORAL | Status: AC
  Administered 2023-02-08: 1 mg via ORAL
  Filled 2023-02-08: qty 1

## 2023-02-08 MED ORDER — POTASSIUM CHLORIDE CRYS ER 20 MEQ PO TBCR: 40.0000 meq | EXTENDED_RELEASE_TABLET | Freq: Once | ORAL | Status: DC

## 2023-02-08 MED ORDER — ROSUVASTATIN CALCIUM 20 MG PO TABS: 20.0000 mg | ORAL_TABLET | Freq: Every day | ORAL | Status: DC

## 2023-02-08 NOTE — Progress Notes (Signed)
                 Interval history Still with the same chest pain.  Dyspnea has not really improved.  Physical exam Blood pressure 123/61, pulse 88, temperature 98.7 F (37.1 C), temperature source Tympanic, resp. rate 18, height 5\' 5"  (1.651 m), weight 75.9 kg, SpO2 90%.  No apparent distress Heart rate and rhythm are normal, radial pulses are strong, heart sounds are distant, jugular venous pulsations at the level of the mandible, lower extremity edema improving Breathing is regular and unlabored on room air, scant right basilar crackles, scant wheezing Skin is warm and dry Alert and oriented Pleasant, congruent affect  Weight change: 3.311 kg   Intake/Output Summary (Last 24 hours) at 02/08/2023 1731 Last data filed at 02/08/2023 1400 Gross per 24 hour  Intake 145.19 ml  Output 3550 ml  Net -3404.81 ml   Net IO Since Admission: -3,944.81 mL [02/08/23 1731]  Labs, images, and other studies Mild hypokalemia Improving creatinine Echo with EF of 20 to 25%, stable since last study  Assessment and plan Hospital day 1  [name] [name] is a 52 y.o. admitted for acute on chronic congestive heart failure.  Principal Problem:    CHF exacerbation (HCC) With mild cardiorenal syndrome.  Still with some dyspnea.  Still volume up today based on exam.  Well-perfused.  Good output yesterday with 40 mg Lasix twice daily.  Will redose with 40 mg of Lasix twice daily today.  GDMT meds sent to [facility] in case of weekend discharge. - Furosemide 40 mg IV twice daily - Potassium 40 mEq p.o. x 2 - A.m. BMP and magnesium  Active Problems:    Type 2 diabetes mellitus with hyperlipidemia (HCC) Glucose well-controlled. - Continue glargine 24 units nightly - Continue SSI    Bipolar disorder with moderate depression (HCC) - Continue home risperidone    COPD (chronic obstructive pulmonary disease) (HCC) No wheezing on exam. - Continue home mometasone-formoterol    Acute kidney injury  superimposed on chronic kidney disease (HCC) Mild cardiorenal syndrome, continue diuresis per above.   Diet: Heart healthy/carb modified IVF: N/A VTE: rivaroxaban (XARELTO) tablet 10 mg Start: 02/06/23 1545  Code: Full PT/OT recommendations: N/A TOC recommendations: Appreciate assistance, patient is declining offered resources  Discharge plan: Pending treatment of CHF exacerbation  Marrianne Mood MD 02/08/2023, 5:31 PM  Pager: [phone] After 5pm or weekend: [phone]

## 2023-02-09 DIAGNOSIS — E1122 Type 2 diabetes mellitus with diabetic chronic kidney disease: Secondary | ICD-10-CM

## 2023-02-09 DIAGNOSIS — Z794 Long term (current) use of insulin: Secondary | ICD-10-CM

## 2023-02-09 DIAGNOSIS — N189 Chronic kidney disease, unspecified: Secondary | ICD-10-CM

## 2023-02-09 DIAGNOSIS — I5022 Chronic systolic (congestive) heart failure: Secondary | ICD-10-CM | POA: Diagnosis not present

## 2023-02-09 DIAGNOSIS — N179 Acute kidney failure, unspecified: Secondary | ICD-10-CM

## 2023-02-09 LAB — BASIC METABOLIC PANEL
Anion gap: 11 (ref 5–15)
BUN: 28 mg/dL — ABNORMAL HIGH (ref 6–20)
CO2: 28 mmol/L (ref 22–32)
Calcium: 8.2 mg/dL — ABNORMAL LOW (ref 8.9–10.3)
Chloride: 99 mmol/L (ref 98–111)
Creatinine, Ser: 1.06 mg/dL — ABNORMAL HIGH (ref 0.44–1.00)
GFR, Estimated: >60 mL/min (ref >60)
Glucose, Bld: 158 mg/dL — ABNORMAL HIGH (ref 70–99)
Potassium: 3.8 mmol/L (ref 3.5–5.1)
Sodium: 138 mmol/L (ref 135–145)

## 2023-02-09 LAB — GLUCOSE, CAPILLARY
Glucose-Capillary: 106 mg/dL — ABNORMAL HIGH (ref 70–99)
Glucose-Capillary: 117 mg/dL — ABNORMAL HIGH (ref 70–99)

## 2023-02-09 LAB — MAGNESIUM: Magnesium: 1.6 mg/dL — ABNORMAL LOW (ref 1.7–2.4)

## 2023-02-09 MED ORDER — LOSARTAN POTASSIUM 25 MG PO TABS
12.5000 mg | ORAL_TABLET | Freq: Every day | ORAL | Status: DC
  Administered 2023-02-09 – 2023-02-10 (×2): 12.5 mg via ORAL
  Filled 2023-02-09 (×2): qty 1

## 2023-02-09 MED ORDER — FUROSEMIDE 40 MG PO TABS
40.0000 mg | ORAL_TABLET | Freq: Two times a day (BID) | ORAL | Status: DC
  Administered 2023-02-09 – 2023-02-12 (×8): 40 mg via ORAL
  Filled 2023-02-09 (×8): qty 1

## 2023-02-09 MED ORDER — MAGNESIUM SULFATE 4 GM/100ML IV SOLN
4.0000 g | Freq: Once | INTRAVENOUS | Status: AC
  Administered 2023-02-09: 4 g via INTRAVENOUS
  Filled 2023-02-09: qty 100

## 2023-02-09 MED ORDER — IPRATROPIUM-ALBUTEROL 0.5-2.5 (3) MG/3ML IN SOLN: 3.0000 mL | Freq: Four times a day (QID) | RESPIRATORY_TRACT | Status: DC | PRN

## 2023-02-09 MED ORDER — POTASSIUM CHLORIDE CRYS ER 20 MEQ PO TBCR
40.0000 meq | EXTENDED_RELEASE_TABLET | Freq: Once | ORAL | Status: AC
  Administered 2023-02-09: 40 meq via ORAL
  Filled 2023-02-09: qty 2

## 2023-02-09 NOTE — Plan of Care (Signed)
Problem: Education: Goal: Ability to describe self-care measures that may prevent or decrease complications (Diabetes Survival Skills Education) will improve 02/09/2023 2223 by Royetta Crochet, RN Outcome: Progressing 02/09/2023 2222 by Royetta Crochet, RN Outcome: Progressing Goal: Individualized Educational Video(s) 02/09/2023 2223 by Royetta Crochet, RN Outcome: Progressing 02/09/2023 2222 by Royetta Crochet, RN Outcome: Progressing   Problem: Coping: Goal: Ability to adjust to condition or change in health will improve 02/09/2023 2223 by Royetta Crochet, RN Outcome: Progressing 02/09/2023 2222 by Royetta Crochet, RN Outcome: Progressing   Problem: Fluid Volume: Goal: Ability to maintain a balanced intake and output will improve 02/09/2023 2223 by Royetta Crochet, RN Outcome: Progressing 02/09/2023 2222 by Royetta Crochet, RN Outcome: Progressing   Problem: Health Behavior/Discharge Planning: Goal: Ability to identify and utilize available resources and services will improve 02/09/2023 2223 by Royetta Crochet, RN Outcome: Progressing 02/09/2023 2222 by Royetta Crochet, RN Outcome: Progressing Goal: Ability to manage health-related needs will improve 02/09/2023 2223 by Royetta Crochet, RN Outcome: Progressing 02/09/2023 2222 by Royetta Crochet, RN Outcome: Progressing   Problem: Metabolic: Goal: Ability to maintain appropriate glucose levels will improve 02/09/2023 2223 by Royetta Crochet, RN Outcome: Progressing 02/09/2023 2222 by Royetta Crochet, RN Outcome: Progressing   Problem: Nutritional: Goal: Maintenance of adequate nutrition will improve 02/09/2023 2223 by Royetta Crochet, RN Outcome: Progressing 02/09/2023 2222 by Royetta Crochet, RN Outcome: Progressing Goal: Progress toward achieving an optimal weight will improve 02/09/2023 2223 by Royetta Crochet, RN Outcome: Progressing 02/09/2023 2222 by Royetta Crochet, RN Outcome: Progressing   Problem: Skin  Integrity: Goal: Risk for impaired skin integrity will decrease 02/09/2023 2223 by Royetta Crochet, RN Outcome: Progressing 02/09/2023 2222 by Royetta Crochet, RN Outcome: Progressing   Problem: Tissue Perfusion: Goal: Adequacy of tissue perfusion will improve 02/09/2023 2223 by Royetta Crochet, RN Outcome: Progressing 02/09/2023 2222 by Royetta Crochet, RN Outcome: Progressing   Problem: Education: Goal: Knowledge of General Education information will improve Description: Including pain rating scale, medication(s)/side effects and non-pharmacologic comfort measures 02/09/2023 2223 by Royetta Crochet, RN Outcome: Progressing 02/09/2023 2222 by Royetta Crochet, RN Outcome: Progressing   Problem: Health Behavior/Discharge Planning: Goal: Ability to manage health-related needs will improve 02/09/2023 2223 by Royetta Crochet, RN Outcome: Progressing 02/09/2023 2222 by Royetta Crochet, RN Outcome: Progressing   Problem: Clinical Measurements: Goal: Ability to maintain clinical measurements within normal limits will improve 02/09/2023 2223 by Royetta Crochet, RN Outcome: Progressing 02/09/2023 2222 by Royetta Crochet, RN Outcome: Progressing Goal: Will remain free from infection 02/09/2023 2223 by Royetta Crochet, RN Outcome: Progressing 02/09/2023 2222 by Royetta Crochet, RN Outcome: Progressing Goal: Diagnostic test results will improve 02/09/2023 2223 by Royetta Crochet, RN Outcome: Progressing 02/09/2023 2222 by Royetta Crochet, RN Outcome: Progressing Goal: Respiratory complications will improve 02/09/2023 2223 by Royetta Crochet, RN Outcome: Progressing 02/09/2023 2222 by Royetta Crochet, RN Outcome: Progressing Goal: Cardiovascular complication will be avoided 02/09/2023 2223 by Royetta Crochet, RN Outcome: Progressing 02/09/2023 2222 by Royetta Crochet, RN Outcome: Progressing   Problem: Activity: Goal: Risk for activity intolerance will decrease 02/09/2023 2223 by Royetta Crochet, RN Outcome: Progressing 02/09/2023 2222 by Royetta Crochet, RN Outcome: Progressing   Problem: Nutrition: Goal: Adequate nutrition will be maintained 02/09/2023 2223 by Royetta Crochet, RN Outcome: Progressing 02/09/2023 2222 by Royetta Crochet, RN Outcome: Progressing  Problem: Coping: Goal: Level of anxiety will decrease 02/09/2023 2223 by Royetta Crochet, RN Outcome: Progressing 02/09/2023 2222 by Royetta Crochet, RN Outcome: Progressing   Problem: Elimination: Goal: Will not experience complications related to bowel motility 02/09/2023 2223 by Royetta Crochet, RN Outcome: Progressing 02/09/2023 2222 by Royetta Crochet, RN Outcome: Progressing Goal: Will not experience complications related to urinary retention 02/09/2023 2223 by Royetta Crochet, RN Outcome: Progressing 02/09/2023 2222 by Royetta Crochet, RN Outcome: Progressing   Problem: Pain Managment: Goal: General experience of comfort will improve 02/09/2023 2223 by Royetta Crochet, RN Outcome: Progressing 02/09/2023 2222 by Royetta Crochet, RN Outcome: Progressing   Problem: Safety: Goal: Ability to remain free from injury will improve 02/09/2023 2223 by Royetta Crochet, RN Outcome: Progressing 02/09/2023 2222 by Royetta Crochet, RN Outcome: Progressing   Problem: Skin Integrity: Goal: Risk for impaired skin integrity will decrease 02/09/2023 2223 by Royetta Crochet, RN Outcome: Progressing 02/09/2023 2222 by Royetta Crochet, RN Outcome: Progressing

## 2023-02-09 NOTE — Plan of Care (Signed)
  Problem: Fluid Volume: Goal: Ability to maintain a balanced intake and output will improve Outcome: Progressing   

## 2023-02-09 NOTE — Progress Notes (Signed)
                 Interval history Feeling somewhat better today.  Still with some dyspnea.  Chest pain is improved.  She does not quite feel ready for discharge.  Physical exam Blood pressure 127/74, pulse 82, temperature 98.2 F (36.8 C), temperature source Oral, resp. rate 16, height 5\' 5"  (1.651 m), weight 72.8 kg, SpO2 93%.  No apparent distress Heart rate is normal, rhythm is regular, no appreciable murmurs Breathing is regular and unlabored on room air, no crackles, scant wheezing throughout Skin is warm and dry Alert and oriented  Weight change: -3.039 kg   Intake/Output Summary (Last 24 hours) at 02/09/2023 1141 Last data filed at 02/09/2023 0340 Gross per 24 hour  Intake 240 ml  Output 2300 ml  Net -2060 ml   Net IO Since Admission: -4,504.81 mL [02/09/23 1141]  Labs, images, and other studies Potassium 3.8 Magnesium 1.6 Creatinine 1.04 => 1.06 BUN 31 => 28  Assessment and plan Hospital day 2  [name] [name] is a 52 y.o. with nonischemic cardiomyopathy and systolic heart failure with LVEF of 20 to 25% to is admitted for CHF exacerbation.  CHF exacerbation Chronic HFrEF Continues to improve.  No crackles on exam today, more volume optimized.  Creatinine stable.  Transition from IV to p.o. furosemide.  Slowly restart GDMT. - Continue telemetry monitoring - DC IV furosemide in favor of 40 mg of furosemide p.o. twice daily - Start losartan 12.5 mg p.o. daily - Potassium and magnesium administered today  AKI on CKD Stable. - Transition to p.o. diuresis per above - A.m. BMP  Type 2 diabetes Pretty good glucose control over the last 12 hours. - Glargine 24 units nightly - SSI before every meal and at bedtime  COPD Some wheezing on exam today.  Continue bronchodilators. - Continue as needed DuoNebs - Continue Dulera daily  Bipolar disorder - Continue outpatient risperidone  Noncardiac chest pain - Continue outpatient oxycodone 10 mg p.o. every 6 hours  as needed  Vitamin D insufficiency - Cholecalciferol 1000 units p.o. daily  Diet: Regular IVF: N/A VTE: rivaroxaban (XARELTO) tablet 10 mg Start: 02/06/23 1545  Code: Full PT/OT recommendations: N/A TOC recommendations: N/A  Discharge plan: Likely tomorrow, approaching euvolemia.  Marrianne Mood MD 02/09/2023, 11:41 AM  Pager: [phone] After 5pm or weekend: [phone]

## 2023-02-09 NOTE — Plan of Care (Signed)

## 2023-02-10 ENCOUNTER — Inpatient Hospital Stay (HOSPITAL_COMMUNITY): Payer: BLUE CROSS/BLUE SHIELD

## 2023-02-10 LAB — GLUCOSE, CAPILLARY
Glucose-Capillary: 233 mg/dL — ABNORMAL HIGH (ref 70–99)
Glucose-Capillary: 224 mg/dL — ABNORMAL HIGH (ref 70–99)
Glucose-Capillary: 156 mg/dL — ABNORMAL HIGH (ref 70–99)
Glucose-Capillary: 223 mg/dL — ABNORMAL HIGH (ref 70–99)

## 2023-02-10 LAB — BASIC METABOLIC PANEL
Anion gap: 10 (ref 5–15)
BUN: 27 mg/dL — ABNORMAL HIGH (ref 6–20)
CO2: 27 mmol/L (ref 22–32)
Calcium: 8 mg/dL — ABNORMAL LOW (ref 8.9–10.3)
Chloride: 100 mmol/L (ref 98–111)
Creatinine, Ser: 1.09 mg/dL — ABNORMAL HIGH (ref 0.44–1.00)
GFR, Estimated: >60 mL/min (ref >60)
Glucose, Bld: 174 mg/dL — ABNORMAL HIGH (ref 70–99)
Potassium: 4.1 mmol/L (ref 3.5–5.1)
Sodium: 137 mmol/L (ref 135–145)

## 2023-02-10 MED ORDER — ONDANSETRON 4 MG PO TBDP
4.0000 mg | ORAL_TABLET | Freq: Once | ORAL | Status: AC
  Administered 2023-02-10: 4 mg via ORAL
  Filled 2023-02-10: qty 1

## 2023-02-10 MED ORDER — HYDROXYZINE HCL 25 MG PO TABS
25.0000 mg | ORAL_TABLET | Freq: Once | ORAL | Status: AC
  Administered 2023-02-10: 25 mg via ORAL
  Filled 2023-02-10: qty 1

## 2023-02-10 NOTE — Plan of Care (Signed)

## 2023-02-10 NOTE — Progress Notes (Signed)
Occupational Therapy Treatment Patient Details Name: [name] [name] MRN: [id number] DOB: 20-Oct-1970 Today's Date: 02/10/2023   History of present illness 52 y/o female admitted to [facility] 8/29 with complaints of SOB x 3 days, 5-7 pounds of weight gain, B LE edema and chest pain. with acute exacerbation of COPD. Diagnosed with CHF exacerbation and HFrEF exacerbation. Intubated 4/10-4/13 for acute exacerbation of COPD and acute respiratory failure. PMHx: HFrEF, nonischemic cardiomyopathy, severe MR, schizoaffective disorder, bipolar disorder, COPD/asthma, cigarette smoker, IIDM, chronic LBP.   OT comments  Patient received in supine and recently medicated for pain. Patient on RA with SpO2 96% and HR 90 in supine. Patient ambulated to sink and performed grooming in standing with SpO2 97% and HR 96. Patient required seated rest break following grooming due to complaints of dizziness with BP 115/74, 94% SpO2, and HR 104. Patient stood once dizziness subsided and BP was 118/71. Patient ambulated 30 feet in room with no AD with SpO2 at 94%, HR 105, and BP 147/73. After seated rest break patient ambulated to bathroom to address toilet transfer and after standing from toilet patient stated she felt dizzy again and was assisted back to toilet. Patient became unresponsive and therapist elevated her feet and she quickly became more responsive and  able to transfer to chair with BP 121/82. Patient was encouraged to stay up in recliner but declined and returned to supine. Acute OT to continue to follow to address self care and functional transfers.       If plan is discharge home, recommend the following:  A little help with walking and/or transfers;A little help with bathing/dressing/bathroom;Assistance with cooking/housework;Assist for transportation;Help with stairs or ramp for entrance   Equipment Recommendations  None recommended by OT    Recommendations for Other Services      Precautions / Restrictions  Precautions Precautions: Other (comment) Precaution Comments: watch BP, appears orthostatic with normal BP readings Restrictions Weight Bearing Restrictions: No       Mobility Bed Mobility Overal bed mobility: Modified Independent             General bed mobility comments: use of bedrails    Transfers Overall transfer level: Needs assistance Equipment used: None Transfers: Sit to/from Stand Sit to Stand: Supervision           General transfer comment: supervision for safety and due to complaints of dizziness     Balance Overall balance assessment: Needs assistance Sitting-balance support: No upper extremity supported, Single extremity supported Sitting balance-Leahy Scale: Good     Standing balance support: No upper extremity supported, During functional activity Standing balance-Leahy Scale: Fair Standing balance comment: able to stand with no UE support with supervision                           ADL either performed or assessed with clinical judgement   ADL Overall ADL's : Needs assistance/impaired     Grooming: Wash/dry hands;Wash/dry face;Oral care;Supervision/safety;Standing Grooming Details (indicate cue type and reason): required seated rest break following due to complaints of dizziness         Upper Body Dressing : Set up;Standing Upper Body Dressing Details (indicate cue type and reason): gown to cover back     Toilet Transfer: Supervision/safety;Ambulation;Regular Teacher, adult education Details (indicate cue type and reason): ambulated to bathroom and transferred to regular toilet. Patient stood from toilet and stated she was dizzy and appeared orthostatic. Patient assisted back to toilet and patient appeared  unresponsive. Feet were elevated and patient quickly began responding.           General ADL Comments: frequent seated rest breaks with complaints of dizziness    Extremity/Trunk Assessment              Vision        Perception     Praxis      Cognition Arousal: Alert Behavior During Therapy: Lability, Anxious, Restless Overall Cognitive Status: No family/caregiver present to determine baseline cognitive functioning                                 General Comments: poor safety, complaints of dizziness        Exercises      Shoulder Instructions       General Comments on RA with 96% SpO2 and 90 HR in supine, standing at sink 97% SpO2 and 96 HR, BP in sitting 115/74 and 118/71 in standing. HR 121/82 seated after unresponsive on toilet    Pertinent Vitals/ Pain       Pain Assessment Pain Assessment: Faces Faces Pain Scale: Hurts little more Pain Location: chest Pain Descriptors / Indicators: Discomfort Pain Intervention(s): Limited activity within patient's tolerance, Monitored during session, Premedicated before session, Repositioned  Home Living                                          Prior Functioning/Environment              Frequency  Min 1X/week        Progress Toward Goals  OT Goals(current goals can now be found in the care plan section)  Progress towards OT goals: Progressing toward goals  Acute Rehab OT Goals Patient Stated Goal: feel better OT Goal Formulation: With patient Time For Goal Achievement: 02/21/23 Potential to Achieve Goals: Good ADL Goals Pt Will Perform Grooming: with modified independence;standing Pt Will Perform Lower Body Bathing: with modified independence;sit to/from stand Pt Will Perform Lower Body Dressing: with modified independence;sit to/from stand;sitting/lateral leans Pt Will Transfer to Toilet: with modified independence;ambulating;regular height toilet Pt Will Perform Toileting - Clothing Manipulation and hygiene: with modified independence;sitting/lateral leans;sit to/from stand  Plan      Co-evaluation                 AM-PAC OT "6 Clicks" Daily Activity     Outcome Measure    Help from another person eating meals?: None Help from another person taking care of personal grooming?: A Little Help from another person toileting, which includes using toliet, bedpan, or urinal?: A Little Help from another person bathing (including washing, rinsing, drying)?: A Little Help from another person to put on and taking off regular upper body clothing?: A Little Help from another person to put on and taking off regular lower body clothing?: A Little 6 Click Score: 19    End of Session Equipment Utilized During Treatment: Gait belt  OT Visit Diagnosis: Other (comment) (decreased activity tolerance)   Activity Tolerance Patient tolerated treatment well;Other (comment) (complaints of dizziness)   Patient Left in bed;with call bell/phone within reach;with bed alarm set   Nurse Communication Mobility status;Other (comment) (BP and O2 readings and episode of nonresponsiviness in bathroom)        Time: 4098-1191 OT Time Calculation (min): 25 min  Charges: OT General Charges $OT Visit: 1 Visit OT Treatments $Self Care/Home Management : 8-22 mins $Therapeutic Activity: 8-22 mins  [name] [name], OTA Acute Rehabilitation Services  Office [phone]   [name] [name] 02/10/2023, 10:59 AM

## 2023-02-10 NOTE — Progress Notes (Signed)
HD#3 SUBJECTIVE:   Overnight Events: Feeling better today but still SOB. No acute events ON.   Notification from nurse received later during the day: Had near syncopal event with OT and was feeling very dizzy.   OBJECTIVE:  Vital Signs: Vitals:   02/10/23 0315 02/10/23 0400 02/10/23 0835 02/10/23 1057  BP: 111/64   94/64  Pulse:   85   Resp: 18  16   Temp: 98.2 F (36.8 C)     TempSrc: Oral     SpO2: 97% 98% 95%   Weight: 74.5 kg     Height:        Intake/Output Summary (Last 24 hours) at 02/10/2023 1359 Last data filed at 02/10/2023 0319 Gross per 24 hour  Intake --  Output 400 ml  Net -400 ml   Net IO Since Admission: -4,804.81 mL [02/10/23 1359]  Physical Exam: Physical Exam Constitutional:      General: She is not in acute distress.    Appearance: She is not ill-appearing or toxic-appearing.  Cardiovascular:     Rate and Rhythm: Normal rate and regular rhythm.     Pulses:          Dorsalis pedis pulses are 3+ on the right side and 3+ on the left side.     Heart sounds: S1 normal and S2 normal. No murmur heard.    No friction rub. No gallop.     Comments: No JVD or hepatojugular reflex present. Pulmonary:     Effort: Tachypnea present. No accessory muscle usage.     Breath sounds: No stridor. Examination of the right-lower field reveals wheezing. Examination of the left-lower field reveals wheezing. Wheezing present. No rhonchi or rales.  Musculoskeletal:     Right lower leg: 1+ Edema present.     Left lower leg: 1+ Edema present.  Skin:    Capillary Refill: Capillary refill takes less than 2 seconds.  Neurological:     Mental Status: She is alert.    Patient Lines/Drains/Airways Status     Active Line/Drains/Airways     Name Placement date Placement time Site Days   Peripheral IV 02/06/23 20 G Anterior;Left Forearm 02/06/23  1033  Forearm  4   Pressure Injury 09/17/22 Buttocks Left;Right Stage 1 -  Intact skin with non-blanchable redness of a  localized area usually over a bony prominence. On both checks 09/17/22  2000  -- 146             ASSESSMENT/PLAN:  Assessment: Principal Problem:   CHF exacerbation (HCC) Active Problems:   Type 2 diabetes mellitus with hyperlipidemia (HCC)   Bipolar disorder with moderate depression (HCC)   Chronic HFrEF (heart failure with reduced ejection fraction) (HCC)   COPD (chronic obstructive pulmonary disease) (HCC)   Acute kidney injury superimposed on chronic kidney disease (HCC)   Plan: [name] [name] is a 52 yo F with PMH significant for HFrEF (20-25% March 2024), nonischemic cardiomyopathy, COPD, T2DM, schizoaffective disorder, and bipolar 1 disorder who presented for shortness of breath and chest pain and admitted for CHF exacerbation HFrEF .   #CHF exacerbation  #HFrEF Patient has a history of HFrEF (EF 20-25% March 2024) and she has not taken any of her medications since June. She reported shortness of breath, orthopnea, and increased LE swelling that had worsened three days prior. BNP 2,678. On exam patient the pt had crackles in bilateral lower lung fields, diffuse wheezing, 1+ pitting edema bilaterally, and mild JVD. She denies using any  of her GDMT meds and was not able to attribute any reason for her discontinuing her medications. Follow up Echo in hospital showed LVEF 20-25%, mitral valve regurgitation that is now moderate (severe regurg in March), and left atrium is severely dilated (normal size in March). Her output was complicated by an AKI on CKD and urine output today remains low. Her weight today is 74.5 kg up from 72.8 kg yesterday. However, on exam, there pitting edema is improved, with no crackles and mild wheezing on auscultation of the lungs. She transitioned to po Furosemide 40mg  yesterday along with Losartan 12.5mg  daily. Her GDMT meds per discharge in April include Toprol, Losartan, Spironolactone, Jardiance, Imdur, and Bumetanide, currently held.  -Hold lasartan and  furosemide in setting of hypotension today  -CXR to assess volume status  -Can consider restarting furosemide tomorrow.  -Hold home meds of toprolol Losartan, Spironolactone, Jardiance, and Bumetanide  -Discontinued Toprol in setting of HFrEF exacerbation -I/Os -daily weights  #AKI on CKD Baseline Cr 0.8-0.9. Cr 1.26 on hospital day 1 morning from 0.70 on admission.  -Hold diuresing for now    #COPD Was previously discharged on Duoneb and albuterol rescue inhaler. However, patient denied using these medications as she had ran out of them. The patient was started on albuterol (SABA), brovana (LABA), budesonide (ICS), and revefenacin (LAMA) nebs as well as prednisone for a potential COPD exacerbation. Antibiotics were held as she did not report a change in sputum and there was no evidence of CAP on XR. Her respiratory panel was negative for flu and covid. On hospital day 1 her wheezing significantly improved, but it was thought this was more likely cardiac related than due to her COPD. Prednisone was discontinued and she continued on dulera and duoneb. He complained of SOB upon ambulation but her saturation remained in the 90s today.  -Cw DuoNebs -Cw dulera   #T2DM Patient discharged on Toujeo 24units at bedtime but she ran out of it in June. Started Semglee 24units at bedtime on admission.  - Glargine 24 units nightly - SSI before every meal and at bedtime    #Schizoaffective disorder #Bipolar 1 Disorder Patient was on risperidone 1mg  in the morning and 2mg  at bedtime at last discharge.  -Continue risperidone   #Noncardiac chest pain - Continue outpatient oxycodone 10 mg p.o. every 6 hours as needed   #Vitamin D insufficiency - Cholecalciferol 1000 units p.o. daily    Best Practice: Diet: Regular IVF: N/A VTE: rivaroxaban (XARELTO) tablet 10 mg Start: 02/06/23 1545  Code: Full PT/OT recommendations: N/A TOC recommendations: N/A  Signature: [facility]   Internal Medicine Resident, PGY-1 [facility]  Pager: [phone] 1:59 PM, 02/10/2023   Please contact the on call pager after 5 pm and on weekends at [phone].

## 2023-02-10 NOTE — Progress Notes (Signed)
Physical Therapy Treatment Patient Details Name: [name] [name] MRN: [id number] DOB: 1970/06/22 Today's Date: 02/10/2023   History of Present Illness 52 y/o female admitted to [facility] 8/29 with complaints of SOB x 3 days, 5-7 pounds of weight gain, B LE edema and chest pain. with acute exacerbation of COPD. Diagnosed with CHF exacerbation and HFrEF exacerbation. Intubated 4/10-4/13 for acute exacerbation of COPD and acute respiratory failure. PMHx: HFrEF, nonischemic cardiomyopathy, severe MR, schizoaffective disorder, bipolar disorder, COPD/asthma, cigarette smoker, IIDM, chronic LBP.    PT Comments  Pt reports min lightheadedness throughout session, BP stable throughout and no near-syncopal episodes. Pt also reporting dyspnea on exertion and feeling like she can't catch her breath during mobility, SpO2 95% on RA throughout. Pt overall supervision to close guard for hallway mobility, pt reaching for environment to self-steady at times. Pt tolerated LE exercises well to address deconditioning. PT to conitnue to follow.       If plan is discharge home, recommend the following: A little help with bathing/dressing/bathroom   Can travel by private vehicle        Equipment Recommendations  None recommended by PT    Recommendations for Other Services       Precautions / Restrictions Precautions Precautions: Fall Restrictions Weight Bearing Restrictions: No     Mobility  Bed Mobility Overal bed mobility: Modified Independent                  Transfers Overall transfer level: Needs assistance Equipment used: None Transfers: Sit to/from Stand Sit to Stand: Supervision           General transfer comment: for safety, slow to rise and sit. stand x2    Ambulation/Gait Ambulation/Gait assistance: Contact guard assist Gait Distance (Feet): 85 Feet Assistive device: None Gait Pattern/deviations: Step-through pattern, Decreased stride length, Trunk flexed Gait velocity: decr      General Gait Details: x1 standing rest break, pt reporting min lightheadedness throughout session though unchanged, BP 112/60 post-gait   Stairs             Wheelchair Mobility     Tilt Bed    Modified Rankin (Stroke Patients Only)       Balance Overall balance assessment: Needs assistance Sitting-balance support: No upper extremity supported, Single extremity supported Sitting balance-Leahy Scale: Good     Standing balance support: No upper extremity supported, During functional activity Standing balance-Leahy Scale: Fair Standing balance comment: able to stand with no UE support with supervision                            Cognition Arousal: Alert Behavior During Therapy: Flat affect Overall Cognitive Status: No family/caregiver present to determine baseline cognitive functioning                                          Exercises General Exercises - Lower Extremity Straight Leg Raises: AROM, Both, Supine, 10 reps Hip Flexion/Marching: AROM, Both, Standing, 15 reps    General Comments General comments (skin integrity, edema, etc.): SpO2 95% on RA, DOE 2/4      Pertinent Vitals/Pain Pain Assessment Pain Assessment: Faces Faces Pain Scale: No hurt Pain Intervention(s): Monitored during session    Home Living  Prior Function            PT Goals (current goals can now be found in the care plan section) Acute Rehab PT Goals PT Goal Formulation: With patient Time For Goal Achievement: 02/21/23 Potential to Achieve Goals: Good Progress towards PT goals: Progressing toward goals    Frequency    Min 1X/week      PT Plan      Co-evaluation              AM-PAC PT "6 Clicks" Mobility   Outcome Measure  Help needed turning from your back to your side while in a flat bed without using bedrails?: None Help needed moving from lying on your back to sitting on the side of a flat  bed without using bedrails?: None Help needed moving to and from a bed to a chair (including a wheelchair)?: A Little Help needed standing up from a chair using your arms (e.g., wheelchair or bedside chair)?: A Little Help needed to walk in hospital room?: A Little Help needed climbing 3-5 steps with a railing? : A Little 6 Click Score: 20    End of Session   Activity Tolerance: Patient tolerated treatment well Patient left: in bed;with call bell/phone within reach Nurse Communication: Mobility status PT Visit Diagnosis: Other abnormalities of gait and mobility (R26.89)     Time: 2956-2130 PT Time Calculation (min) (ACUTE ONLY): 17 min  Charges:    $Therapeutic Activity: 8-22 mins PT General Charges $$ ACUTE PT VISIT: 1 Visit                     Marye Round, PT DPT Acute Rehabilitation Services Secure Chat Preferred  Office [phone]    Georgie Eduardo Sheliah Plane 02/10/2023, 3:41 PM

## 2023-02-11 ENCOUNTER — Other Ambulatory Visit (HOSPITAL_COMMUNITY): Payer: Self-pay

## 2023-02-11 DIAGNOSIS — I5020 Unspecified systolic (congestive) heart failure: Secondary | ICD-10-CM | POA: Diagnosis not present

## 2023-02-11 DIAGNOSIS — I110 Hypertensive heart disease with heart failure: Secondary | ICD-10-CM | POA: Diagnosis not present

## 2023-02-11 DIAGNOSIS — F17210 Nicotine dependence, cigarettes, uncomplicated: Secondary | ICD-10-CM | POA: Diagnosis not present

## 2023-02-11 LAB — BASIC METABOLIC PANEL
Anion gap: 9 (ref 5–15)
BUN: 29 mg/dL — ABNORMAL HIGH (ref 6–20)
CO2: 26 mmol/L (ref 22–32)
Calcium: 8.1 mg/dL — ABNORMAL LOW (ref 8.9–10.3)
Chloride: 101 mmol/L (ref 98–111)
Creatinine, Ser: 1.08 mg/dL — ABNORMAL HIGH (ref 0.44–1.00)
GFR, Estimated: >60 mL/min (ref >60)
Glucose, Bld: 160 mg/dL — ABNORMAL HIGH (ref 70–99)
Potassium: 4.2 mmol/L (ref 3.5–5.1)
Sodium: 136 mmol/L (ref 135–145)

## 2023-02-11 LAB — GLUCOSE, CAPILLARY
Glucose-Capillary: 151 mg/dL — ABNORMAL HIGH (ref 70–99)
Glucose-Capillary: 175 mg/dL — ABNORMAL HIGH (ref 70–99)
Glucose-Capillary: 130 mg/dL — ABNORMAL HIGH (ref 70–99)
Glucose-Capillary: 182 mg/dL — ABNORMAL HIGH (ref 70–99)

## 2023-02-11 LAB — MAGNESIUM: Magnesium: 2 mg/dL (ref 1.7–2.4)

## 2023-02-11 MED ORDER — TOUJEO SOLOSTAR 300 UNIT/ML ~~LOC~~ SOPN
24.0000 [IU] | PEN_INJECTOR | Freq: Every day | SUBCUTANEOUS | 11 refills | Status: DC
  Filled 2023-02-11: qty 3, 38d supply, fill #0

## 2023-02-11 MED ORDER — BUMETANIDE 1 MG PO TABS
1.0000 mg | ORAL_TABLET | Freq: Two times a day (BID) | ORAL | 1 refills | Status: DC
  Filled 2023-02-11: qty 60, 30d supply, fill #0

## 2023-02-11 MED ORDER — MOMETASONE FURO-FORMOTEROL FUM 200-5 MCG/ACT IN AERO
2.0000 | INHALATION_SPRAY | Freq: Two times a day (BID) | RESPIRATORY_TRACT | 3 refills | Status: DC
  Filled 2023-02-11: qty 13, 30d supply, fill #0

## 2023-02-11 MED ORDER — SPIRONOLACTONE 25 MG PO TABS
12.5000 mg | ORAL_TABLET | Freq: Every day | ORAL | 1 refills | Status: DC
  Filled 2023-02-11: qty 30, 60d supply, fill #0

## 2023-02-11 MED ORDER — INSULIN PEN NEEDLE 32G X 4 MM MISC
1.0000 | Freq: Three times a day (TID) | 0 refills | Status: DC
  Filled 2023-02-11: qty 100, 34d supply, fill #0

## 2023-02-11 MED ORDER — ONETOUCH VERIO VI STRP
1.0000 | ORAL_STRIP | Freq: Three times a day (TID) | 0 refills | Status: DC
  Filled 2023-02-11: qty 100, 34d supply, fill #0

## 2023-02-11 MED ORDER — ONETOUCH DELICA LANCETS 33G MISC
1.0000 | Freq: Three times a day (TID) | 0 refills | Status: DC
  Filled 2023-02-11: qty 100, 34d supply, fill #0

## 2023-02-11 MED ORDER — LORAZEPAM 1 MG PO TABS
1.0000 mg | ORAL_TABLET | Freq: Once | ORAL | Status: AC
  Administered 2023-02-11: 1 mg via ORAL
  Filled 2023-02-11: qty 1

## 2023-02-11 MED ORDER — ONETOUCH DELICA PLUS LANCING MISC
1.0000 | Freq: Three times a day (TID) | 0 refills | Status: DC
  Filled 2023-02-11: qty 1, 30d supply, fill #0

## 2023-02-11 MED ORDER — BUMETANIDE 2 MG PO TABS
2.0000 mg | ORAL_TABLET | Freq: Two times a day (BID) | ORAL | 8 refills | Status: DC
  Filled 2023-02-11: qty 60, 30d supply, fill #0

## 2023-02-11 MED ORDER — ROSUVASTATIN CALCIUM 20 MG PO TABS
20.0000 mg | ORAL_TABLET | Freq: Every day | ORAL | 1 refills | Status: DC
  Filled 2023-02-11: qty 30, 30d supply, fill #0

## 2023-02-11 MED ORDER — MIDODRINE HCL 5 MG PO TABS
5.0000 mg | ORAL_TABLET | Freq: Three times a day (TID) | ORAL | Status: DC
  Administered 2023-02-11 – 2023-02-12 (×4): 5 mg via ORAL
  Filled 2023-02-11 (×4): qty 1

## 2023-02-11 MED ORDER — RISPERIDONE 2 MG PO TABS
1.0000 mg | ORAL_TABLET | ORAL | 3 refills | Status: DC
  Filled 2023-02-11: qty 45, 30d supply, fill #0

## 2023-02-11 NOTE — Progress Notes (Signed)
Physical Therapy Treatment Patient Details Name: [name] [name] MRN: [id number] DOB: 1971-01-16 Today's Date: 02/11/2023   History of Present Illness 52 y/o female admitted to [facility] 8/29 with complaints of SOB x 3 days, 5-7 pounds of weight gain, B LE edema and chest pain. with acute exacerbation of COPD. Diagnosed with CHF exacerbation and HFrEF exacerbation. Intubated 4/10-4/13 for acute exacerbation of COPD and acute respiratory failure. PMHx: HFrEF, nonischemic cardiomyopathy, severe MR, schizoaffective disorder, bipolar disorder, COPD/asthma, cigarette smoker, IIDM, chronic LBP.    PT Comments  Pt difficult to motivate to participate, agreeable with encouragement. Pt tolerated 100+ ft gait without AD and improved steadiness today, does report constant lightheadedness that started in standing and stayed constant during gait BP 105/66 and SPO2 WFL on RA. Pt reports resolution of lightheadedness with return to supine. PT encouraged pt to self-monitor for s/s lightheadedness at home, and plan for seated rest breaks as needed, pt expresses understanding.      If plan is discharge home, recommend the following: Assist for transportation   Can travel by private vehicle        Equipment Recommendations  None recommended by PT    Recommendations for Other Services       Precautions / Restrictions Precautions Precautions: Other (comment) Precaution Comments: watch BP, appears orthostatic with normal BP readings Restrictions Weight Bearing Restrictions: No     Mobility  Bed Mobility Overal bed mobility: Needs Assistance Bed Mobility: Supine to Sit, Sit to Supine     Supine to sit: Modified independent (Device/Increase time) Sit to supine: Modified independent (Device/Increase time)   General bed mobility comments: use of bedrails, inc time    Transfers Overall transfer level: Needs assistance Equipment used: None Transfers: Sit to/from Stand Sit to Stand: Supervision            General transfer comment: safety, slow to rise. stand x1 from EOB    Ambulation/Gait Ambulation/Gait assistance: Supervision Gait Distance (Feet): 120 Feet Assistive device: None Gait Pattern/deviations: Step-through pattern, Decreased stride length Gait velocity: decr     General Gait Details: for safety, slowed especially with challenge (directional changes)   Stairs             Wheelchair Mobility     Tilt Bed    Modified Rankin (Stroke Patients Only)       Balance Overall balance assessment: Needs assistance Sitting-balance support: No upper extremity supported, Single extremity supported Sitting balance-Leahy Scale: Good     Standing balance support: No upper extremity supported, During functional activity Standing balance-Leahy Scale: Fair                              Cognition Arousal: Alert Behavior During Therapy: Flat affect Overall Cognitive Status: No family/caregiver present to determine baseline cognitive functioning                                 General Comments: difficult to motivate to participate        Exercises      General Comments General comments (skin integrity, edema, etc.): spo2 95-98% on RA      Pertinent Vitals/Pain Pain Assessment Pain Assessment: Faces Faces Pain Scale: No hurt Pain Intervention(s): Monitored during session    Home Living  Prior Function            PT Goals (current goals can now be found in the care plan section) Acute Rehab PT Goals PT Goal Formulation: With patient Time For Goal Achievement: 02/21/23 Potential to Achieve Goals: Good Progress towards PT goals: Progressing toward goals    Frequency    Min 1X/week      PT Plan      Co-evaluation              AM-PAC PT "6 Clicks" Mobility   Outcome Measure  Help needed turning from your back to your side while in a flat bed without using bedrails?:  None Help needed moving from lying on your back to sitting on the side of a flat bed without using bedrails?: None Help needed moving to and from a bed to a chair (including a wheelchair)?: None Help needed standing up from a chair using your arms (e.g., wheelchair or bedside chair)?: A Little Help needed to walk in hospital room?: A Little Help needed climbing 3-5 steps with a railing? : A Little 6 Click Score: 21    End of Session   Activity Tolerance: Patient tolerated treatment well Patient left: in bed;with call bell/phone within reach Nurse Communication: Mobility status PT Visit Diagnosis: Other abnormalities of gait and mobility (R26.89)     Time: 0347-4259 PT Time Calculation (min) (ACUTE ONLY): 9 min  Charges:    $Therapeutic Activity: 8-22 mins PT General Charges $$ ACUTE PT VISIT: 1 Visit                     Marye Round, PT DPT Acute Rehabilitation Services Secure Chat Preferred  Office [phone]    Annaka Cleaver Sheliah Plane 02/11/2023, 12:03 PM

## 2023-02-11 NOTE — Progress Notes (Signed)
   02/11/23 1300  Assess: MEWS Score  BP (!) 71/48  MAP (mmHg) (!) 56  Assess: MEWS Score  MEWS Temp 0  MEWS Systolic 2  MEWS Pulse 0  MEWS RR 0  MEWS LOC 0  MEWS Score 2  MEWS Score Color Yellow  Assess: if the MEWS score is Yellow or Red  Were vital signs accurate and taken at a resting state? Yes  Does the patient meet 2 or more of the SIRS criteria? No  MEWS guidelines implemented  Yes, yellow  Treat  MEWS Interventions Considered administering scheduled or prn medications/treatments as ordered  Take Vital Signs  Increase Vital Sign Frequency  Yellow: Q2hr x1, continue Q4hrs until patient remains green for 12hrs  Escalate  MEWS: Escalate Yellow: Discuss with charge nurse and consider notifying provider and/or RRT  Notify: Charge Nurse/RN  Name of Charge Nurse/RN Notified Engineering geologist  Provider Notification  Provider Name/Title Heide Spark, MD  Date Provider Notified 02/11/23  Time Provider Notified 1300  Method of Notification Page  Notification Reason Change in status  Provider response No new orders  Date of Provider Response 02/11/23  Time of Provider Response 1310

## 2023-02-11 NOTE — Discharge Instructions (Addendum)
Thank you for allowing [place] to take care of you today.   You were in the hospital because you had worsening of your heart failure. This was causing shortness of breath and chest pain.   For your heart failure, make sure to take the following medication:  Bumetanide 1mg  twice a day Midodrine  5mg  three times a day with meals  This helps with congestion in your lungs and lower leg swelling. If you feel light headed you can hold your medicines for one day.   Please follow up with [place] at the [facility] on September 17th at 2:45pm. We can manage your medications and send you refills once you follow up.    For your COPD continue taking:   Dulera  Inhale 2 puffs two times a day, one in the morning and another at night   Albuterol inhale 2 puffs every 6 hours AS NEEDED for wheezing or shortness of breath   Duoneb inhale 3 mls by nebulization every 6 hours AS NEEDED for shortness of breath   For your diabetes:  Continue taking 24 units of insulin at bedtime.  For your Bipolar Disorder   Continue taking risperidone half a tablet in the morning and half a tablet in the afternoon.   For your blood cholesterol take:   Crestor 20mg  daily   If you get chest pain when doing exercise you can take:   Nitroglycerin 1 tablet under the tongue- be careful as this medication will lower your blood pressure and can make you feel dizzy.   Please take care and remember to follow up with [place] in clinic.

## 2023-02-11 NOTE — TOC Transition Note (Signed)
Transition of Care [facility]) - CM/SW Discharge Note   Patient Details  Name: [name] [name] MRN: [id number] Date of Birth: 18-May-1971  Transition of Care [facility]) CM/SW Contact:  Lawerance Sabal, RN Phone Number: 02/11/2023, 12:09 PM   Clinical Narrative:     Sherron Monday w patient at bedside. She states that her son will be able to pick her up and also provide transportation to f/u apts, IMTS will schedule f/u at their office.    Final next level of care: Home/Self Care Barriers to Discharge: No Barriers Identified   Patient Goals and CMS Choice   Choice offered to / list presented to : NA  Discharge Placement                         Discharge Plan and Services Additional resources added to the After Visit Summary for   In-house Referral: Clinical Social Work Discharge Planning Services: CM Consult Post Acute Care Choice: NA            DME Agency: NA       HH Arranged: NA          Social Determinants of Health (SDOH) Interventions SDOH Screenings   Food Insecurity: Food Insecurity Present (02/07/2023)  Housing: High Risk (02/06/2023)  Transportation Needs: Unmet Transportation Needs (02/06/2023)  Utilities: Not At Risk (02/06/2023)  Alcohol Screen: Low Risk  (12/18/2018)  Depression (PHQ2-9): High Risk (09/30/2022)  Financial Resource Strain: High Risk (09/30/2022)  Social Connections: Unknown (10/19/2021)   Received from [facility]  Stress: No Stress Concern Present (01/07/2022)   Received from [facility], [facility]  Tobacco Use: High Risk (02/06/2023)     Readmission Risk Interventions    09/02/2022    2:41 PM 08/28/2022    3:52 PM 08/22/2022    3:19 PM  Readmission Risk Prevention Plan  Transportation Screening Complete Complete Complete  Medication Review Oceanographer) Complete Complete Complete  PCP or Specialist appointment within 3-5 days of discharge Complete Complete Complete  HRI or Home Care Consult Complete Complete Complete   SW Recovery Care/Counseling Consult --  Complete  Palliative Care Screening Not Applicable Not Applicable Not Applicable  Skilled Nursing Facility Not Applicable Not Applicable Not Applicable

## 2023-02-11 NOTE — Progress Notes (Addendum)
                 Interval history Felt better overnight.  Still with same chest pain.  Later in the day, notified about hypotension.  Evaluated at bedside.  She is sleeping, apparently comfortably.  Note pressures on the monitor as low as 70/40.  She is awoken for repeat blood pressure which is around 110/70.  Physical exam Blood pressure 107/72, pulse 86, temperature 99.6 F (37.6 C), temperature source Oral, resp. rate 16, height 5\' 5"  (1.651 m), weight 74.1 kg, SpO2 94%.  No apparent distress Heart rate is normal, rhythm is regular Skin is warm and dry Alert and oriented Depressed mood and congruent affect, somewhat disheveled  Weight change: -0.428 kg  No intake or output data in the 24 hours ending 02/11/23 1550 Net IO Since Admission: -5,004.81 mL [02/11/23 1550]  Assessment and plan Hospital day 4  [name] [name] is a 52 y.o. with history of nonischemic cardiomyopathy and chronic HFrEF LVEF 20 to 25% who is admitted for acute on chronic CHF.  CHF exacerbation Resolved.  Euvolemic.  On oral Lasix, will transition to bumetanide on discharge.  Meds sent to [facility] pharmacy. - Furosemide 40 mg p.o. twice daily - Bumex 1 mg twice daily on discharge - Hold GDMT due to hypotension  Hypotension Intermittent, wonder if its mostly when she is sleeping.  She worked with PT today and her blood pressure was okay.  When she wakes up and sits up in bed her blood pressure is okay.  Pretty profoundly hypotensive at times though, with MAP of around 50.  Otherwise she is well-perfused on exam.  GDMT held. - Start midodrine 5 mg 3 times daily with meals  COPD Outpatient bronchodilators.  Insulin-dependent diabetes - Glargine 24 units nightly  Bipolar disorder - Outpatient risperidone  Noncardiac chest pain - Outpatient oxycodone - Topical lidocaine  Vitamin D insufficiency - Outpatient cholecalciferol  Diet: Regular IVF: N/A VTE: rivaroxaban (XARELTO) tablet 10 mg Start:  02/06/23 1545  Code: Full PT/OT recommendations: No follow-up necessary TOC recommendations: N/A  Discharge plan: Hopefully tomorrow pending management of hypotension with midodrine  Marrianne Mood MD 02/11/2023, 3:50 PM  Pager: [phone] After 5pm or weekend: [phone]

## 2023-02-11 NOTE — Plan of Care (Signed)

## 2023-02-12 ENCOUNTER — Other Ambulatory Visit (HOSPITAL_COMMUNITY): Payer: Self-pay

## 2023-02-12 ENCOUNTER — Inpatient Hospital Stay
Admission: AD | Admit: 2023-02-12 | Discharge: 2023-02-22 | DRG: 885 | Disposition: A | Discharge status: Home / Self Care | Payer: BLUE CROSS/BLUE SHIELD | Source: Intra-hospital | Attending: Psychiatry | Admitting: Psychiatry

## 2023-02-12 ENCOUNTER — Other Ambulatory Visit: Payer: Self-pay

## 2023-02-12 ENCOUNTER — Encounter: Payer: Self-pay | Admitting: Psychiatry

## 2023-02-12 DIAGNOSIS — Z7951 Long term (current) use of inhaled steroids: Secondary | ICD-10-CM | POA: Diagnosis not present

## 2023-02-12 DIAGNOSIS — F17210 Nicotine dependence, cigarettes, uncomplicated: Secondary | ICD-10-CM | POA: Diagnosis present

## 2023-02-12 DIAGNOSIS — Z888 Allergy status to other drugs, medicaments and biological substances status: Secondary | ICD-10-CM | POA: Diagnosis not present

## 2023-02-12 DIAGNOSIS — I5022 Chronic systolic (congestive) heart failure: Secondary | ICD-10-CM | POA: Diagnosis present

## 2023-02-12 DIAGNOSIS — F3130 Bipolar disorder, current episode depressed, mild or moderate severity, unspecified: Secondary | ICD-10-CM | POA: Diagnosis present

## 2023-02-12 DIAGNOSIS — Z79899 Other long term (current) drug therapy: Secondary | ICD-10-CM

## 2023-02-12 DIAGNOSIS — Z8249 Family history of ischemic heart disease and other diseases of the circulatory system: Secondary | ICD-10-CM

## 2023-02-12 DIAGNOSIS — Z5982 Transportation insecurity: Secondary | ICD-10-CM

## 2023-02-12 DIAGNOSIS — F419 Anxiety disorder, unspecified: Secondary | ICD-10-CM | POA: Diagnosis present

## 2023-02-12 DIAGNOSIS — G4700 Insomnia, unspecified: Secondary | ICD-10-CM | POA: Diagnosis present

## 2023-02-12 DIAGNOSIS — Z794 Long term (current) use of insulin: Secondary | ICD-10-CM | POA: Diagnosis not present

## 2023-02-12 DIAGNOSIS — I959 Hypotension, unspecified: Secondary | ICD-10-CM

## 2023-02-12 DIAGNOSIS — E119 Type 2 diabetes mellitus without complications: Secondary | ICD-10-CM

## 2023-02-12 DIAGNOSIS — J449 Chronic obstructive pulmonary disease, unspecified: Secondary | ICD-10-CM | POA: Diagnosis present

## 2023-02-12 DIAGNOSIS — Z818 Family history of other mental and behavioral disorders: Secondary | ICD-10-CM | POA: Diagnosis not present

## 2023-02-12 DIAGNOSIS — Z5986 Financial insecurity: Secondary | ICD-10-CM

## 2023-02-12 DIAGNOSIS — E11649 Type 2 diabetes mellitus with hypoglycemia without coma: Secondary | ICD-10-CM | POA: Diagnosis present

## 2023-02-12 DIAGNOSIS — Z91199 Patient's noncompliance with other medical treatment and regimen due to unspecified reason: Secondary | ICD-10-CM

## 2023-02-12 DIAGNOSIS — F314 Bipolar disorder, current episode depressed, severe, without psychotic features: Secondary | ICD-10-CM

## 2023-02-12 DIAGNOSIS — J4489 Other specified chronic obstructive pulmonary disease: Secondary | ICD-10-CM | POA: Diagnosis present

## 2023-02-12 DIAGNOSIS — F332 Major depressive disorder, recurrent severe without psychotic features: Secondary | ICD-10-CM | POA: Insufficient documentation

## 2023-02-12 DIAGNOSIS — Z833 Family history of diabetes mellitus: Secondary | ICD-10-CM | POA: Diagnosis not present

## 2023-02-12 DIAGNOSIS — E861 Hypovolemia: Secondary | ICD-10-CM | POA: Diagnosis not present

## 2023-02-12 DIAGNOSIS — R9431 Abnormal electrocardiogram [ECG] [EKG]: Secondary | ICD-10-CM | POA: Diagnosis present

## 2023-02-12 DIAGNOSIS — R45851 Suicidal ideations: Secondary | ICD-10-CM | POA: Diagnosis present

## 2023-02-12 DIAGNOSIS — E559 Vitamin D deficiency, unspecified: Secondary | ICD-10-CM | POA: Insufficient documentation

## 2023-02-12 LAB — BASIC METABOLIC PANEL
Anion gap: 12 (ref 5–15)
BUN: 34 mg/dL — ABNORMAL HIGH (ref 6–20)
CO2: 24 mmol/L (ref 22–32)
Calcium: 8.2 mg/dL — ABNORMAL LOW (ref 8.9–10.3)
Chloride: 103 mmol/L (ref 98–111)
Creatinine, Ser: 1.18 mg/dL — ABNORMAL HIGH (ref 0.44–1.00)
GFR, Estimated: 55 mL/min — ABNORMAL LOW (ref >60)
Glucose, Bld: 160 mg/dL — ABNORMAL HIGH (ref 70–99)
Potassium: 4.1 mmol/L (ref 3.5–5.1)
Sodium: 139 mmol/L (ref 135–145)

## 2023-02-12 LAB — GLUCOSE, CAPILLARY
Glucose-Capillary: 182 mg/dL — ABNORMAL HIGH (ref 70–99)
Glucose-Capillary: 265 mg/dL — ABNORMAL HIGH (ref 70–99)

## 2023-02-12 MED ORDER — ROSUVASTATIN CALCIUM 20 MG PO TABS
20.0000 mg | ORAL_TABLET | Freq: Every day | ORAL | Status: DC
  Administered 2023-02-14 – 2023-02-22 (×9): 20 mg via ORAL
  Filled 2023-02-12 (×10): qty 1

## 2023-02-12 MED ORDER — HALOPERIDOL LACTATE 5 MG/ML IJ SOLN: 5.0000 mg | Freq: Three times a day (TID) | INTRAMUSCULAR | Status: DC | PRN

## 2023-02-12 MED ORDER — TOUJEO SOLOSTAR 300 UNIT/ML ~~LOC~~ SOPN
28.0000 [IU] | PEN_INJECTOR | Freq: Every day | SUBCUTANEOUS | 11 refills | Status: DC
Start: 2023-02-12 — End: 2023-05-27
  Filled 2023-02-12: qty 3, 32d supply, fill #0

## 2023-02-12 MED ORDER — NICOTINE POLACRILEX 2 MG MT GUM
2.0000 mg | CHEWING_GUM | OROMUCOSAL | 0 refills | Status: DC | PRN
Start: 2023-02-12 — End: 2023-02-22
  Filled 2023-02-12: qty 100, fill #0

## 2023-02-12 MED ORDER — INSULIN ASPART 100 UNIT/ML IJ SOLN
0.0000 [IU] | Freq: Three times a day (TID) | INTRAMUSCULAR | Status: DC
  Administered 2023-02-14: 3 [IU] via SUBCUTANEOUS
  Administered 2023-02-14: 2 [IU] via SUBCUTANEOUS
  Administered 2023-02-15 (×2): 3 [IU] via SUBCUTANEOUS
  Administered 2023-02-16: 2 [IU] via SUBCUTANEOUS
  Administered 2023-02-16 – 2023-02-18 (×5): 3 [IU] via SUBCUTANEOUS
  Administered 2023-02-18 – 2023-02-22 (×5): 2 [IU] via SUBCUTANEOUS
  Filled 2023-02-12 (×17): qty 1

## 2023-02-12 MED ORDER — IPRATROPIUM-ALBUTEROL 0.5-2.5 (3) MG/3ML IN SOLN: 3.0000 mL | Freq: Four times a day (QID) | RESPIRATORY_TRACT | Status: DC | PRN

## 2023-02-12 MED ORDER — LORAZEPAM 2 MG PO TABS: 2.0000 mg | ORAL_TABLET | Freq: Three times a day (TID) | ORAL | Status: DC | PRN

## 2023-02-12 MED ORDER — INSULIN GLARGINE-YFGN 100 UNIT/ML ~~LOC~~ SOLN
28.0000 [IU] | Freq: Every day | SUBCUTANEOUS | Status: DC
  Administered 2023-02-12: 28 [IU] via SUBCUTANEOUS
  Filled 2023-02-12 (×2): qty 0.28

## 2023-02-12 MED ORDER — INFLUENZA VIRUS VACC SPLIT PF (FLUZONE) 0.5 ML IM SUSY: 0.5000 mL | PREFILLED_SYRINGE | INTRAMUSCULAR | Status: DC

## 2023-02-12 MED ORDER — RIVAROXABAN 10 MG PO TABS: 10.0000 mg | ORAL_TABLET | Freq: Every day | ORAL | Status: DC

## 2023-02-12 MED ORDER — INSULIN ASPART 100 UNIT/ML IJ SOLN
0.0000 [IU] | Freq: Every day | INTRAMUSCULAR | Status: DC
  Administered 2023-02-12: 3 [IU] via SUBCUTANEOUS
  Administered 2023-02-13: 2 [IU] via SUBCUTANEOUS
  Administered 2023-02-14: 4 [IU] via SUBCUTANEOUS
  Filled 2023-02-12 (×5): qty 1

## 2023-02-12 MED ORDER — OXYCODONE HCL 5 MG PO TABS
10.0000 mg | ORAL_TABLET | Freq: Four times a day (QID) | ORAL | Status: DC | PRN
  Administered 2023-02-14 – 2023-02-22 (×32): 10 mg via ORAL
  Filled 2023-02-12 (×32): qty 2

## 2023-02-12 MED ORDER — VITAMIN D 50 MCG (2000 UT) PO TABS
2000.0000 [IU] | ORAL_TABLET | Freq: Every day | ORAL | 1 refills | Status: DC
  Filled 2023-02-12: qty 90, 90d supply, fill #0

## 2023-02-12 MED ORDER — ACETAMINOPHEN 325 MG PO TABS
650.0000 mg | ORAL_TABLET | Freq: Four times a day (QID) | ORAL | Status: DC | PRN
  Administered 2023-02-13: 650 mg via ORAL
  Filled 2023-02-12: qty 2

## 2023-02-12 MED ORDER — POLYETHYLENE GLYCOL 3350 17 G PO PACK
17.0000 g | PACK | Freq: Every day | ORAL | Status: DC
  Filled 2023-02-12 (×3): qty 1

## 2023-02-12 MED ORDER — VITAMIN D 25 MCG (1000 UNIT) PO TABS
1000.0000 [IU] | ORAL_TABLET | Freq: Every day | ORAL | Status: DC
  Administered 2023-02-14 – 2023-02-22 (×9): 1000 [IU] via ORAL
  Filled 2023-02-12 (×8): qty 1

## 2023-02-12 MED ORDER — RISPERIDONE 1 MG PO TABS
2.0000 mg | ORAL_TABLET | Freq: Every day | ORAL | Status: DC
  Administered 2023-02-12: 2 mg via ORAL
  Filled 2023-02-12: qty 2

## 2023-02-12 MED ORDER — SENNA 8.6 MG PO TABS
1.0000 | ORAL_TABLET | Freq: Two times a day (BID) | ORAL | Status: DC
  Administered 2023-02-13 – 2023-02-22 (×17): 8.6 mg via ORAL
  Filled 2023-02-12 (×21): qty 1

## 2023-02-12 MED ORDER — RISPERIDONE 1 MG PO TABS
1.0000 mg | ORAL_TABLET | Freq: Every morning | ORAL | Status: DC
  Filled 2023-02-12: qty 1

## 2023-02-12 MED ORDER — PNEUMOCOCCAL 20-VAL CONJ VACC 0.5 ML IM SUSY
0.5000 mL | PREFILLED_SYRINGE | INTRAMUSCULAR | Status: DC
  Filled 2023-02-12: qty 0.5

## 2023-02-12 MED ORDER — BUMETANIDE 1 MG PO TABS
1.0000 mg | ORAL_TABLET | Freq: Two times a day (BID) | ORAL | Status: DC
  Administered 2023-02-14: 1 mg via ORAL
  Filled 2023-02-12 (×4): qty 1

## 2023-02-12 MED ORDER — NICOTINE POLACRILEX 2 MG MT GUM
2.0000 mg | CHEWING_GUM | OROMUCOSAL | Status: DC | PRN
  Filled 2023-02-12: qty 1

## 2023-02-12 MED ORDER — MIDODRINE HCL 5 MG PO TABS
5.0000 mg | ORAL_TABLET | Freq: Three times a day (TID) | ORAL | 1 refills | Status: DC
  Filled 2023-02-12: qty 90, 30d supply, fill #0

## 2023-02-12 MED ORDER — MOMETASONE FURO-FORMOTEROL FUM 200-5 MCG/ACT IN AERO
2.0000 | INHALATION_SPRAY | Freq: Two times a day (BID) | RESPIRATORY_TRACT | Status: DC
  Administered 2023-02-13 – 2023-02-22 (×18): 2 via RESPIRATORY_TRACT
  Filled 2023-02-12 (×2): qty 8.8

## 2023-02-12 MED ORDER — FUROSEMIDE 20 MG PO TABS: 40.0000 mg | ORAL_TABLET | Freq: Two times a day (BID) | ORAL | Status: DC

## 2023-02-12 MED ORDER — HYDROXYZINE HCL 25 MG PO TABS
25.0000 mg | ORAL_TABLET | Freq: Three times a day (TID) | ORAL | Status: DC | PRN
  Administered 2023-02-15 – 2023-02-19 (×5): 25 mg via ORAL
  Filled 2023-02-12 (×5): qty 1

## 2023-02-12 MED ORDER — MIDODRINE HCL 5 MG PO TABS
5.0000 mg | ORAL_TABLET | Freq: Three times a day (TID) | ORAL | Status: DC
  Administered 2023-02-13 – 2023-02-22 (×29): 5 mg via ORAL
  Filled 2023-02-12 (×30): qty 1

## 2023-02-12 MED ORDER — NICOTINE POLACRILEX 2 MG MT GUM
2.0000 mg | CHEWING_GUM | OROMUCOSAL | Status: DC | PRN
  Administered 2023-02-14: 2 mg via ORAL
  Filled 2023-02-12: qty 1

## 2023-02-12 MED ORDER — ALUM & MAG HYDROXIDE-SIMETH 200-200-20 MG/5ML PO SUSP: 30.0000 mL | ORAL | Status: DC | PRN

## 2023-02-12 MED ORDER — VITAMIN D3 25 MCG PO TABS
1000.0000 [IU] | ORAL_TABLET | Freq: Every day | ORAL | 1 refills | Status: DC
  Filled 2023-02-12: qty 90, 90d supply, fill #0

## 2023-02-12 MED ORDER — LIDOCAINE 5 % EX OINT: TOPICAL_OINTMENT | Freq: Four times a day (QID) | CUTANEOUS | Status: DC | PRN

## 2023-02-12 MED ORDER — DOCUSATE SODIUM 100 MG PO CAPS
100.0000 mg | ORAL_CAPSULE | Freq: Two times a day (BID) | ORAL | Status: DC
  Administered 2023-02-12 – 2023-02-21 (×17): 100 mg via ORAL
  Filled 2023-02-12 (×19): qty 1

## 2023-02-12 MED ORDER — LORAZEPAM 2 MG/ML IJ SOLN: 2.0000 mg | Freq: Three times a day (TID) | INTRAMUSCULAR | Status: DC | PRN

## 2023-02-12 MED ORDER — MAGNESIUM HYDROXIDE 400 MG/5ML PO SUSP: 30.0000 mL | Freq: Every day | ORAL | Status: DC | PRN

## 2023-02-12 MED ORDER — INSULIN GLARGINE-YFGN 100 UNIT/ML ~~LOC~~ SOLN
28.0000 [IU] | Freq: Every day | SUBCUTANEOUS | Status: DC
  Filled 2023-02-12: qty 0.28

## 2023-02-12 MED ORDER — HALOPERIDOL 5 MG PO TABS: 5.0000 mg | ORAL_TABLET | Freq: Three times a day (TID) | ORAL | Status: DC | PRN

## 2023-02-12 NOTE — Progress Notes (Signed)
Pt transferred to the BMU from [facility]. Pt calm and pleasant during admission. Pt denies SI/HI/AVH. Pt compliant with medication administration per MD orders. Pt given education, support, and encouragement to be active in her treatment plan. Pt being monitored Q 15 minutes per unit protocol, Pt remains safe on the unit
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
## 2023-02-12 NOTE — Consult Note (Signed)
[facility] Psychiatry Consult Note   Service Date: February 12, 2023 LOS:  LOS: 5 days  MRN: [id number] Type of consult:  New   Primary Psychiatric Diagnoses  Bipolar I disorder, current episode depressed, severe Historical diagnosis of schizoaffective disorder Tobacco use disorder  Assessment  TAMMICA BERGMANN is a 52 y.o. female with a history of homelessness and a past psychiatric history of bipolar I disorder and multiple inpatient psychiatric hospitalizations, substance use history of tobacco use disorder, and significant PMHx of CHF, insulin-dependent DM, vitamin D insufficiency, and hypothyroidism . Psychiatry was consulted after patient was endorsing active suicidal ideation with stated plan to "blow brains out" by Marrianne Mood, MD.   Her current presentation of persistently depressed mood, poor sleep, lack of energy, and feelings of worthlessness with suicidal ideations is most consistent with a severe depressive episode consistent with her bipolar I diagnosis. There is some suspicion of expansive mood as evidenced by her inappropriate laughter when acknowledging suicidal thoughts, but I have a low suspicion for a mixed episode at this time given denial of other hypo/mania symptoms.  Most concerning at present is patient's passive and active SI for months with plan to shoot herself in the head. She reportedly has access to three loaded firearms (I could not confirm this information from collateral calls) but fortunately has not made any gestures. She is at high risk of attempting suicide if discharged home at this time, especially with multiple loaded firearms that are not secured.  This severe depressive episode insufficiently treated with current psychotropic regimen accompanied by active suicidal ideations with plan and access to lethal means qualifies patient for involuntary commitment.   I was unable to explore patient's past psychiatric history in greater detail as she  became more irate when she realized it was less likely she was going home. Will defer more in-depth exploration of psychiatric evaluation to a subsequent encounter (or to inpatient psychiatry). Will attempt to call collateral contacts again to secure firearms.  Diagnoses:  Active Hospital problems: Principal Problem:   CHF exacerbation (HCC) Active Problems:   Type 2 diabetes mellitus with hyperlipidemia (HCC)   Bipolar disorder with moderate depression (HCC)   Chronic HFrEF (heart failure with reduced ejection fraction) (HCC)   COPD (chronic obstructive pulmonary disease) (HCC)   Acute kidney injury superimposed on chronic kidney disease (HCC)   Vitamin D deficiency    Plan and Recommendations  ## Interventions (medications, psychoeducation, etc):  -- risperidone 1 mg daily and 2 mg at bedtime for mood stabilization / psychosis(?) per primary team -- can consider restarting home sertraline (was unable to confirm current adherence and dosage) to treat depressive symptoms -- recommending inpatient psych as below, IVC as below. Social worker informed.  ## Further Work-up:  -- none  -- most recent EKG on 02/11/2023 had QtC of 507 -- Pertinent labwork reviewed earlier this admission includes: CMP, CBC, and TSH  ## Medical Decision Making Capacity:  - Not formally assessed during this encounter  ## Disposition:  -- Admission to inpatient psychiatric facility indicated after medical clearance. Patient has no identifiable medical barriers to inpatient psychiatry admission. Med-psych unit is not indicated at this time.  ## Behavioral / Environmental:  -- Standard delirium precautions and Fall precautions  ##Legal Status -- Patient involuntarily committed  Thank you for this consult request. Our recommendations are listed above.  We will continue to follow patient's hospital course  ## Safety and Observation Level:  - Based on my clinical evaluation,  I estimate the patient to be at  high risk of self harm in the current setting - At this time, we recommend a in-person one-to-one level of observation. This decision is based on my review of the chart including patient's history and current presentation, interview of the patient, mental status examination, and consideration of suicide risk including evaluating suicidal ideation, plan, intent, suicidal or self-harm behaviors, risk factors, and protective factors. This judgment is based on our ability to directly address suicide risk, implement suicide prevention strategies and develop a safety plan while the patient is in the clinical setting. Please contact our team if there is a concern that risk level has changed.  Augusto Gamble, MD  History Obtained on Initial Interview  Relevant Aspects of Hospital Course:  Admitted on 02/06/2023 for CHF exacerbation.  Patient Report:   I asked patient about the statement heard ("blowing brains out") by her primary team this morning and whether or not it was accurate. She laughs inappropriately, saying, "yes that's accurate." She clarifies that the team was explaining how her heart was really weak, prompting her to say, "I wish wish it would just give out  so I don't have to blow my brains out."  Patient states she has been depressed for months, not sleeping well, lacking energy, and feeling worthless about herself. She also has had both thoughts of not wanting to be alive ("I want to die - I do. I don't want to live like this.") and thoughts of trying something to end her life, stating she has 3 loaded guns at home in her dresser. She denies loading them on purpose in-line with her suicidal thoughts ("they are always just loaded"). She denies handling any of the guns recently, saying, "I only think about it - I haven't tried it"). She denies any other ways she thinks about killing herself.  She denies experiencing a persistently elevated mood recently, but did endorse some racing thoughts "months  ago" and when asked, says her last manic episode was "four years ago." She says the hospitalizations between then and now have been more related to her heart issues.  Felt depressed, poor sleep, laying in bed, racing thoughts and not  for a few months now - life in general. Hasn't had a manic episode since 4 years ago.  She denies hearing things others do not hear or seeing things others do not see. She denies feeling someone out to get her or anyone following her.  She says she was only taking her prescribed Risperdal once a day and shows me her prescription bottle which said she should be taking it twice a day. She laughs and says she got confused and loudly pondered why her pain medicine doctor only prescribed her 30 pills ("it should have been 60 if he wanted me to take it twice a day"). She says she was consistently taking her Zoloft prior to admission.  As the interview went on, patient kept asking if she was going to leave today, to which I responded that it was unlikely given her severe depression and the safety concerns she expressed. She became irate and picked up the phone to call her son Judie Grieve. I overheard patient say, "I can't leave today they're [facility] the [facility]." I asked patient to put the phone down so we could finish our conversation, after which she says to her son, "the [place] man says I have to put the phone down."  Patient tells me she does not want  to stay and has to be home. I reiterated that this would not be possible today and she kept trying to argue, saying, "I was just joking about the guns." I asked patient permission to speak to her son (and later returned to ask permission to speak to her daughter) to which she agreed. I politely ended the conversation and exited the room.  Psychiatric ROS:  Depression: screens positively, see above Anxiety: not assessed at this time Hypo/mania: inappropriate laughter only, otherwise low suspicion Psychosis:  inappropriate laughter only, denies AVH, no evidence of paranoid behavior / grossly disorganized speech  Collateral information obtained Noland Fordyce, patient's son) Patient granted permission to speak to contact person without restrictions.  Per Judie Grieve, he is not sure if patient has firearms at home. He is not familiar with her living situation at present nor her psychiatric history. He defers these questions to patient's daughter Cicero Duck  Collateral information attempted x4 Forestine Na, patient's daughter) Unable to reach contact person - no voicemail  Psychiatric and Social History   Psychiatric History  Information collected from patient and available chart review  Prev dx/sx: bipolar I disorder, schizoaffective disorder Current Psych Provider: no current psychiatrist - sees pain management doctor Current Psych Meds: risperidone only once a day, Zoloft 100 mg daily Past Psych Meds: risperidone, sertraline  Family psych history: not obtained (see above)  Social History:  Living situation: living in a residence (?) Access to weapons: 3 firearms at home, all loaded, unsecured in cabinet  Substance Use History: Alcohol: tried in past as a teenager, not currently drinking  Tobacco: endorses, current, smokes 1 packs per day  for 50 years Cannabis (marijuana): never tried Cocaine: never tried Methamphetamines: never tried Psilocybin (mushrooms): tried in past, very minimal, no current use Ecstasy (MDMA / molly): never tried Opiates (fentanyl / heroin):  denies abuse, takes oxycodone prescribed by pain doctor Benzos (Xanax, Klonopin): never tried IV drug use: denies Prescribed meds abuse: denies   Other Histories  These are pulled from EMR and updated if appropriate.   Family History:  The patient's family history includes Bipolar disorder in her mother; Diabetes in her father; Hypertension in her father.  Medical History: Past Medical History:  Diagnosis Date   Anxiety  disorder    Asthma    Bipolar affective disorder (HCC)    Blood transfusion without reported diagnosis    CHF (congestive heart failure) (HCC)    COPD (chronic obstructive pulmonary disease) (HCC)    Depression    Diabetes mellitus    Migraine    Schizophrenia (HCC)     Surgical History: Past Surgical History:  Procedure Laterality Date   KNEE ARTHROSCOPY     x 2   KNEE SURGERY Left    Incision made and knee cleaned out   TUBAL LIGATION      Medications:   Current Facility-Administered Medications:    acetaminophen (TYLENOL) tablet 1,000 mg, 1,000 mg, Oral, Q6H, 1,000 mg at 02/12/23 0600 **OR** acetaminophen (TYLENOL) suppository 650 mg, 650 mg, Rectal, Q6H, Marrianne Mood, MD   cholecalciferol (VITAMIN D3) 25 MCG (1000 UNIT) tablet 1,000 Units, 1,000 Units, Oral, Daily, Marrianne Mood, MD, 1,000 Units at 02/12/23 0932   docusate sodium (COLACE) capsule 100 mg, 100 mg, Oral, BID, Marrianne Mood, MD, 100 mg at 02/12/23 0932   furosemide (LASIX) tablet 40 mg, 40 mg, Oral, BID, Marrianne Mood, MD, 40 mg at 02/12/23 0932   insulin aspart (novoLOG) injection 0-15 Units, 0-15 Units, Subcutaneous, TID WC, Gomez-Caraballo, Byrd Hesselbach,  MD, 5 Units at 02/12/23 0934   insulin aspart (novoLOG) injection 0-5 Units, 0-5 Units, Subcutaneous, QHS, Morene Crocker, MD, 2 Units at 02/10/23 2138   insulin glargine-yfgn (SEMGLEE) injection 28 Units, 28 Units, Subcutaneous, QHS, Alexander-Savino, [place], MD   ipratropium-albuterol (DUONEB) 0.5-2.5 (3) MG/3ML nebulizer solution 3 mL, 3 mL, Nebulization, Q6H PRN, Marrianne Mood, MD   lidocaine (XYLOCAINE) 5 % ointment, , Topical, QID PRN, Marrianne Mood, MD, Given at 02/11/23 2205   midodrine (PROAMATINE) tablet 5 mg, 5 mg, Oral, TID WC, Marrianne Mood, MD, 5 mg at 02/12/23 0932   mometasone-formoterol (DULERA) 200-5 MCG/ACT inhaler 2 puff, 2 puff, Inhalation, BID, Debe Coder B, MD, 2 puff at 02/12/23 0809   nicotine  polacrilex (NICORETTE) gum 2 mg, 2 mg, Oral, PRN, Alexander-Savino, [place], MD   oxyCODONE (Oxy IR/ROXICODONE) immediate release tablet 10 mg, 10 mg, Oral, Q6H PRN, Marrianne Mood, MD, 10 mg at 02/12/23 1038   polyethylene glycol (MIRALAX / GLYCOLAX) packet 17 g, 17 g, Oral, Daily, Marrianne Mood, MD, 17 g at 02/07/23 0865   risperiDONE (RISPERDAL) tablet 1 mg, 1 mg, Oral, q AM, 1 mg at 02/12/23 0600 **AND** risperiDONE (RISPERDAL) tablet 2 mg, 2 mg, Oral, QHS, Debe Coder B, MD, 2 mg at 02/11/23 2205   rivaroxaban (XARELTO) tablet 10 mg, 10 mg, Oral, Daily, Marrianne Mood, MD, 10 mg at 02/12/23 0933   rosuvastatin (CRESTOR) tablet 20 mg, 20 mg, Oral, Daily, Marrianne Mood, MD, 20 mg at 02/12/23 0932   senna (SENOKOT) tablet 8.6 mg, 1 tablet, Oral, BID, Marrianne Mood, MD, 8.6 mg at 02/12/23 0932  Allergies: Allergies  Allergen Reactions   Tramadol Nausea Only and Rash   Benadryl [Diphenhydramine] Other (See Comments)    Decreases motor skills and increases anxiety   Lactose Intolerance (Gi)    Wellbutrin [Bupropion] Other (See Comments)    rage   Nicotine Rash    Patient states she is allergic to the Nicotine patch and they cause her to break out in a rash.  She states she can smoke cigarettes.    Exam Findings  Vital signs:  Temp:  [98.8 F (37.1 C)-99.6 F (37.6 C)] 98.8 F (37.1 C) (09/04 0349) Pulse Rate:  [79-86] 85 (09/04 0933) Resp:  [16-17] 17 (09/04 0933) BP: (71-114)/(47-72) 93/58 (09/04 0933) SpO2:  [94 %-98 %] 97 % (09/04 0933) Weight:  [74 kg] 74 kg (09/04 0349)  Psychiatric Specialty Exam:  Presentation  General Appearance: Casual  Eye Contact:Good  Speech:Clear and Coherent  Speech Volume:Normal  Handedness:No data recorded  Mood and Affect  Mood:-- ("I want to go home")  Affect:Inappropriate (notable inappropriate laughter at times)   Thought Process  Thought Processes:Linear; Goal Directed; Coherent  Descriptions of  Associations:Intact  Orientation:Full (Time, Place and Person)  Thought Content:WDL  History of Schizophrenia/Schizoaffective disorder:No data recorded Duration of Psychotic Symptoms:No data recorded Hallucinations:Hallucinations: None  Ideas of Reference:None  Suicidal Thoughts:Suicidal Thoughts: Yes, Active SI Active Intent and/or Plan: With Intent; With Plan; With Means to Carry Out; With Access to Means  Homicidal Thoughts:Homicidal Thoughts: No   Sensorium  Memory:Immediate Good; Recent Good; Remote Good  Judgment:Poor  Insight:Poor   Executive Functions  Concentration:Good  Attention Span:Good  Recall:Good  Fund of Knowledge:Good  Language:Good   Psychomotor Activity  Psychomotor Activity:Psychomotor Activity: Normal   Assets  Assets:Resilience   Sleep  Sleep:Sleep: Good   Physical Exam: Physical Exam Vitals and nursing note reviewed.  HENT:     Head: Normocephalic and atraumatic.  Pulmonary:  Effort: Pulmonary effort is normal.  Neurological:     General: No focal deficit present.     Mental Status: She is alert. Mental status is at baseline.    Blood pressure (!) 93/58, pulse 85, temperature 98.8 F (37.1 C), temperature source Oral, resp. rate 17, height 5\' 5"  (1.651 m), weight 74 kg, SpO2 97%. Body mass index is 27.15 kg/m.

## 2023-02-12 NOTE — Progress Notes (Addendum)
  Update-2:20pm- IVC papers served.Patient is IVC'd.   Completed IVC paperwork submitted to magistrate. Envelope number [id number]. Case number-[id number]. CSW called [facility] and requested for officer to serve IVC papers.

## 2023-02-12 NOTE — Progress Notes (Signed)
   02/12/23 2034  Psych Admission Type (Psych Patients Only)  Admission Status Involuntary  Psychosocial Assessment  Patient Complaints Depression  Eye Contact Fair  Facial Expression Flat  Affect Depressed  Speech Logical/coherent  Interaction Assertive;Needy  Motor Activity Unsteady;Slow  Appearance/Hygiene Unremarkable  Behavior Characteristics Cooperative;Appropriate to situation  Mood Depressed  Aggressive Behavior  Effect No apparent injury  Thought Process  Coherency WDL  Content WDL  Delusions None reported or observed  Perception WDL  Hallucination None reported or observed  Judgment Impaired  Confusion None  Danger to Self  Current suicidal ideation? Denies  Danger to Others  Danger to Others None reported or observed
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
## 2023-02-12 NOTE — Progress Notes (Signed)
Secure chat message sent to provider to ask for safety precaution order and safety sitter, due to new psych consult for suicidal ideation. Per MD not needed at this time.

## 2023-02-12 NOTE — Progress Notes (Addendum)
Patient IVC'd from [facility]. Patient recently discharged from cardiac unit. Upon discharge patient started having SI stating she was going to "blow her brains out." Patient was transferred here due to unsecured firearms in the home. Patient states that she lives with a friend. Has hx of heart failure, exhibits generalized weakness but is ambulatory. Patient is diabetic. Denies SI/ HI at this time. No AVH. Patient had dinner and a snack. Denies pain at this time. Patient being transferred to BMU. Report given to Marylu Lund, [place].

## 2023-02-12 NOTE — TOC Transition Note (Signed)
Transition of Care [facility]) - CM/SW Discharge Note   Patient Details  Name: [name] [name] MRN: [id number] Date of Birth: 1971/04/07  Transition [facility] CM/SW Contact:  Delilah Shan, LCSWA Phone Number: 02/12/2023, 3:43 PM   Clinical Narrative:     Patient will DC to: [facility].  Anticipated DC date: 02/12/2023  Family notified: Patient gave CSW permission to call her son Lennie Hummer by: Kathryne Sharper  ?  Per MD patient ready for DC to [facility] . RN, patient, patient's son, and facility notified of DC.IVC paperwork faxed to facility.RN given number for report [phone] RM# 30. DC packet on chart. Patient will transport by [facility].  CSW signing off.   Final next level of care:  [facility] psych) Barriers to Discharge: No Barriers Identified   Patient Goals and CMS Choice   Choice offered to / list presented to : NA  Discharge Placement                Patient chooses bed at:  [facility]) Patient to be transferred to facility by: [facility]   Patient and family notified of of transfer: 02/12/23  Discharge Plan and Services Additional resources added to the After Visit Summary for   In-house Referral: Clinical Social Work Discharge Planning Services: CM Consult Post Acute Care Choice: NA            DME Agency: NA       HH Arranged: NA          Social Determinants of Health (SDOH) Interventions SDOH Screenings   Food Insecurity: Food Insecurity Present (02/07/2023)  Housing: High Risk (02/06/2023)  Transportation Needs: Unmet Transportation Needs (02/06/2023)  Utilities: Not At Risk (02/06/2023)  Alcohol Screen: Low Risk  (12/18/2018)  Depression (PHQ2-9): High Risk (09/30/2022)  Financial Resource Strain: High Risk (09/30/2022)  Social Connections: Unknown (10/19/2021)   Received from [facility]  Stress: No Stress Concern Present (01/07/2022)   Received from [facility], [facility]  Tobacco Use: High Risk  (02/06/2023)     Readmission Risk Interventions    09/02/2022    2:41 PM 08/28/2022    3:52 PM 08/22/2022    3:19 PM  Readmission Risk Prevention Plan  Transportation Screening Complete Complete Complete  Medication Review Oceanographer) Complete Complete Complete  PCP or Specialist appointment within 3-5 days of discharge Complete Complete Complete  HRI or Home Care Consult Complete Complete Complete  SW Recovery Care/Counseling Consult --  Complete  Palliative Care Screening Not Applicable Not Applicable Not Applicable  Skilled Nursing Facility Not Applicable Not Applicable Not Applicable

## 2023-02-13 ENCOUNTER — Encounter: Payer: Self-pay | Admitting: Psychiatry

## 2023-02-13 DIAGNOSIS — I5022 Chronic systolic (congestive) heart failure: Secondary | ICD-10-CM

## 2023-02-13 DIAGNOSIS — F3130 Bipolar disorder, current episode depressed, mild or moderate severity, unspecified: Secondary | ICD-10-CM

## 2023-02-13 LAB — GLUCOSE, CAPILLARY
Glucose-Capillary: 102 mg/dL — ABNORMAL HIGH (ref 70–99)
Glucose-Capillary: 110 mg/dL — ABNORMAL HIGH (ref 70–99)
Glucose-Capillary: 204 mg/dL — ABNORMAL HIGH (ref 70–99)

## 2023-02-13 MED ORDER — INSULIN GLARGINE-YFGN 100 UNIT/ML ~~LOC~~ SOLN
15.0000 [IU] | Freq: Every day | SUBCUTANEOUS | Status: DC
  Administered 2023-02-14 – 2023-02-22 (×9): 15 [IU] via SUBCUTANEOUS
  Filled 2023-02-13 (×9): qty 0.15

## 2023-02-13 MED ORDER — ENSURE ENLIVE PO LIQD
237.0000 mL | Freq: Two times a day (BID) | ORAL | Status: DC
  Administered 2023-02-15 – 2023-02-17 (×4): 237 mL via ORAL

## 2023-02-13 MED ORDER — LURASIDONE HCL 40 MG PO TABS
20.0000 mg | ORAL_TABLET | Freq: Every day | ORAL | Status: DC
  Administered 2023-02-14 – 2023-02-18 (×5): 20 mg via ORAL
  Filled 2023-02-13 (×4): qty 1

## 2023-02-13 NOTE — Consult Note (Signed)
Initial Consultation Note   Patient: [name] [name] TFT:[id number] DOB: 08-17-1970 [facility] DOA: 02/12/2023 DOS: the patient was seen and examined on 02/13/2023 Primary service: Lewanda Rife, MD  Referring physician: Dr. Daphine Deutscher Reason for consult: Hypotension  Assessment/Plan: Assessment and Plan: No notes have been filed under this hospital service. Service: Hospitalist  Hypotension -Clinically patient appears to be volume contracted on top of baseline orthostatic hypotension, and she is already on midodrine for that. -Plan to hold off diuresis for today, this was discussed with psychiatry unit nurse at bedside. -Check orthostatic vital signs tonight and tomorrow morning, if her volume status is corrected but still with orthostatic vital signs, consider increasing midodrine.  Will also send a cortisol level.  Review of her labs showed her TSH and T4 both were within normal limits recently. -Blood work showed slight increase of creatinine level, likely from volume contraction and hypertension, plan to hold off diuresis today.  Recheck BMP tomorrow.  Chronic HFrEF -Volume contracted, plan to hold off today's diuresis, reevaluate her orthostatic vital signs and volume status tomorrow to decide whether to resume her diuresis -Daily weight  Borderline hypoglycemia -Probably from poor oral intake for the last few days, will cut down her Lantus dosage -New sliding scale  QTC prolongation -Resolved.  Her psych medication changed, risperidone discontinued, and patient was started on Latuda.  Plan to check QTc level tomorrow again, if QTc level remains stable can consider check EKG sporadically, otherwise recommend daily EKG until QTc stabilized.  COPD -Stable, continue LABA, ICS and LAMA   TRH will continue to follow the patient.  HPI: [name] [name] is a 52 y.o. female with past medical history of chronic HFrEF with LVEF 20-25%, COPD, IDDM, was admitted to  psychiatry unit for management of depression.  Patient was recently hospitalized for CHF decompensation secondary to noncompliant with CHF medications.  Her CHF medication were adjusted during hospitalization, due to persistent orthostatic hypotension most of her blood pressure medication discontinued.  She was started on midodrine and Bumex 1 mg twice daily (cut down from 2 mg twice daily and discharged to psychiatry unit.  Medical team was asked to evaluate the patient for persistent borderline  hypotension, and slowly increase of creatinine reading on BMP as well as prolonged QTc interval.  Today, psychiatry unit nurse reported that patient blood pressure SBP in the lower 80s and patient reports feeling lethargic.  Patient denies any shortness of breath chest pain lightheadedness at this point.  Review of Systems: As mentioned in the history of present illness. All other systems reviewed and are negative. Past Medical History:  Diagnosis Date   Anxiety disorder    Asthma    Bipolar affective disorder (HCC)    Blood transfusion without reported diagnosis    CHF (congestive heart failure) (HCC)    COPD (chronic obstructive pulmonary disease) (HCC)    Depression    Diabetes mellitus    Migraine    Schizophrenia (HCC)    Past Surgical History:  Procedure Laterality Date   KNEE ARTHROSCOPY     x 2   KNEE SURGERY Left    Incision made and knee cleaned out   TUBAL LIGATION     Social History:  reports that she has been smoking cigarettes. She has a 29 pack-year smoking history. She has never used smokeless tobacco. She reports that she does not currently use alcohol. She reports that she does not use drugs.  Allergies  Allergen Reactions   Tramadol  Nausea Only and Rash   Benadryl [Diphenhydramine] Other (See Comments)    Decreases motor skills and increases anxiety   Lactose Intolerance (Gi)    Wellbutrin [Bupropion] Other (See Comments)    rage   Nicotine Rash    Patient states she is  allergic to the Nicotine patch and they cause her to break out in a rash.  She states she can smoke cigarettes.    Family History  Problem Relation Age of Onset   Bipolar disorder Mother    Hypertension Father    Diabetes Father     Prior to Admission medications   Medication Sig Start Date End Date Taking? Authorizing Provider  acetaminophen (TYLENOL) 325 MG tablet Take 1 tablet (325 mg total) by mouth every 6 (six) hours as needed for headache or mild pain. Patient not taking: Reported on 02/06/2023 09/27/22   Arrien, York Ram, MD  albuterol (VENTOLIN HFA) 108 (90 Base) MCG/ACT inhaler Inhale 2 puffs into the lungs every 6 (six) hours as needed for wheezing or shortness of breath. 09/27/22   Arrien, York Ram, MD  blood glucose meter kit and supplies Dispense based on patient and insurance preference. Use up to four times daily as directed. (FOR ICD-10 E10.9, E11.9). 01/06/18   Rodolph Bong, MD  bumetanide (BUMEX) 1 MG tablet Take 1 tablet (1 mg total) by mouth 2 (two) times daily. 02/11/23 04/12/23  Marrianne Mood, MD  vitamin D3 (CHOLECALCIFEROL) 25 MCG tablet Take 1 tablet (1,000 Units total) by mouth daily. 02/13/23   Alexander-Savino, [place], MD  glucose blood (ONETOUCH VERIO) test strip Use three times daily as directed to check blood sugar 02/11/23   Alexander-Savino, [place], MD  insulin glargine, 1 Unit Dial, (TOUJEO SOLOSTAR) 300 UNIT/ML Solostar Pen Inject 28 Units into the skin at bedtime. 02/12/23   Hassan Rowan, [place], MD  Insulin Pen Needle 32G X 4 MM MISC Use As Directed 09/30/22   Arrien, York Ram, MD  Insulin Pen Needle 32G X 4 MM MISC Use 3 (three) times daily. 02/11/23   Alexander-Savino, [place], MD  ipratropium-albuterol (DUONEB) 0.5-2.5 (3) MG/3ML SOLN inhale 3 mLs by nebulization every 6 (six) hours as needed. 10/11/22   Alen Bleacher, NP  Lancet Devices [facility] DELICA PLUS LANCING) MISC Use 3 (three) times daily. 02/11/23   Alexander-Savino,  [place], MD  midodrine (PROAMATINE) 5 MG tablet Take 1 tablet (5 mg total) by mouth 3 (three) times daily with meals. 02/12/23   Alexander-Savino, [place], MD  mometasone-formoterol [facility]) 200-5 MCG/ACT AERO Inhale 2 puffs into the lungs 2 (two) times daily. 02/11/23   Carmina Miller, DO  nicotine polacrilex (NICORETTE) 2 MG gum Take 1 each (2 mg total) by mouth as needed for smoking cessation. 02/12/23   Alexander-Savino, [place], MD  nitroGLYCERIN (NITROSTAT) 0.4 MG SL tablet Place 1 tablet (0.4 mg total) under the tongue every 5 (five) minutes as needed for chest pain. 09/27/22   Arrien, York Ram, MD  OneTouch Delica Lancets 33G MISC Use 3 (three) times daily as directed to check blood sugar 02/11/23   Alexander-Savino, [place], MD  oxyCODONE-acetaminophen (PERCOCET) 10-325 MG tablet Take 1 tablet by mouth every 6 (six) hours as needed for pain. 09/02/22   Wouk, Wilfred Curtis, MD  potassium chloride (KLOR-CON M) 10 MEQ tablet Take 1 tablet (10 mEq total) by mouth daily. Patient not taking: Reported on 02/06/2023 10/11/22 07/08/23  Alen Bleacher, NP  risperiDONE (RISPERDAL) 2 MG tablet Take 1/2 tablet in the morning and one tablet  in the evening. 02/11/23   Carmina Miller, DO  rosuvastatin (CRESTOR) 20 MG tablet Take 1 tablet (20 mg total) by mouth daily. 02/11/23 03/13/23  Hassan Rowan, [place], MD  benztropine (COGENTIN) 1 MG tablet Take 1 tablet (1 mg total) by mouth 2 (two) times daily. 12/23/18 06/04/20  Malvin Johns, MD  insulin aspart (NOVOLOG) 100 UNIT/ML injection Inject 0-9 Units into the skin 3 (three) times daily with meals. Patient not taking: Reported on 06/03/2020 12/16/18 06/04/20  Lanae Boast, MD    Physical Exam: Vitals:   02/12/23 1900 02/12/23 2056 02/13/23 0614  BP:  100/62 (!) 104/59  Pulse:  81 87  Resp:  14 19  Temp:  (!) 97.5 F (36.4 C) (!) 97.3 F (36.3 C)  TempSrc:  Oral   SpO2:  100% 97%  Weight: 73.7 kg 68 kg   Height: 5\' 5"  (1.651 m) 5\' 5"  (1.651 m)     Subjective:  Eyes: PERRL, lids and conjunctivae normal ENMT: Mucous membranes are dry. Posterior pharynx clear of any exudate or lesions.Normal dentition.  Neck: normal, supple, no masses, no thyromegaly Respiratory: clear to auscultation bilaterally, no wheezing, no crackles. Normal respiratory effort. No accessory muscle use.  Cardiovascular: Regular rate and rhythm, no murmurs / rubs / gallops. No extremity edema. 2+ pedal pulses. No carotid bruits.  Abdomen: no tenderness, no masses palpated. No hepatosplenomegaly. Bowel sounds positive.  Musculoskeletal: no clubbing / cyanosis. No joint deformity upper and lower extremities. Good ROM, no contractures. Normal muscle tone.  Skin: no rashes, lesions, ulcers. No induration Neurologic: CN 2-12 grossly intact. Sensation intact, DTR normal.  Slight weakness on left side compared to right side Psychiatric: Normal judgment and insight. Alert and oriented x 3. Normal mood.   Data Reviewed:    Family Communication: None Primary team communication: Psychiatrist and psychiatry unit nurse at bedside Thank you very much for involving [place] in the care of your patient.  Author: Emeline General, MD 02/13/2023 3:20 PM  For on call review [email or web address].

## 2023-02-13 NOTE — [facility] Suicide Risk Assessment (Signed)
[facility] INPATIENT:  Family/Significant Other Suicide Prevention Education  Suicide Prevention Education:  Patient Refusal for Family/Significant Other Suicide Prevention Education: The patient [name] [name] has refused to provide written consent for family/significant other to be provided Family/Significant Other Suicide Prevention Education during admission and/or prior to discharge.  Physician notified.  SPE completed with pt, as pt refused to consent to family contact. SPI pamphlet provided to pt and pt was encouraged to share information with support network, ask questions, and talk about any concerns relating to SPE. Pt denies access to guns/firearms and verbalized understanding of information provided. Mobile Crisis information also provided to pt.   Harden Mo 02/13/2023, 2:03 PM

## 2023-02-13 NOTE — Group Note (Signed)
Recreation Therapy Group Note   Group Topic:Coping Skills  Group Date: 02/13/2023 Start Time: 1000 End Time: 1055 Facilitators: Rosina Lowenstein, LRT, CTRS Location:  Craft Room  Group Description: Mind Map.  Patient was provided a blank template of a diagram with 32 blank boxes in a tiered system, branching from the center (similar to a bubble chart). LRT directed patients to label the middle of the diagram "Coping Skills". LRT and patients then came up with 8 different coping skills as examples. Pt were directed to record their coping skills in the 2nd tier boxes closest to the center.  Patients would then share their coping skills with the group as LRT wrote them out. LRT gave a handout of 99 different coping skills at the end of group.    Goal Area(s) Addressed: Patients will be able to define "coping skills". Patient will identify new coping skills.  Patient will increase communication.   Affect/Mood: N/A   Participation Level: Did not attend    Clinical Observations/Individualized Feedback: [name] [name] did not attend group.  Plan: Continue to engage patient in RT group sessions 2-3x/week.   Rosina Lowenstein, LRT, CTRS 02/13/2023 11:21 AM

## 2023-02-13 NOTE — Plan of Care (Signed)
  Problem: Safety: Goal: Ability to remain free from injury will improve Outcome: Progressing   

## 2023-02-13 NOTE — H&P (Signed)
Psychiatric Admission Assessment Adult  Patient Identification: [name] [name] MRN:  [id number] Date of Evaluation:  02/13/2023 Chief Complaint:  MDD (major depressive disorder), recurrent episode, severe (HCC) [F33.2] Principal Diagnosis: Bipolar I disorder, most recent episode depressed (HCC) Diagnosis:  Principal Problem:   Bipolar I disorder, most recent episode depressed (HCC) Active Problems:   Chronic systolic CHF (congestive heart failure) (HCC)  History of Present Illness: 52 y/o female with history of COPD, Chronic HFrEF, bipolar disorder, admitted to inpatient psychiatry after verbalizing suicidal ideations with plan to "blow brains out".   On assessment today, pt reports she was admitted because during her medical admission "I think I said I'd just rather be dead than deal with this shit". When asked about this, admits to verbalizing she would shoot herself with a firearm. States she has 3 firearms at home. Continues to verbalize suicidal ideations at this time w/ same plan. States she has been feeling this way "for a long time, for months now". Reports she has not acted on her thoughts due to "my children". Reports she has 3 children 67 y/o, 53 y/o, and 48 y/o. She denies homicidal ideations. She denies auditory visual hallucinations or paranoia. She denies past history of suicide attempts or non suicidal self injurious behaviors. Chart review suggests suicide attempt on 50 lisinopril 10mg  tabs and 50 benadryl 25mg  tabs in 2020. She reports "a couple" of inpatient psychiatric hospitalizations, states last inpatient psychiatric hospitalization was "a long time ago".   Pt reports use of 1/2 pack a day of cigarettes. She denies use of marijuana, crack/cocaine, methamphetamines, alcohol, fentanyl/heroin. No UDS is available at this time.   She denies family psychiatric history although chart review suggests mother with bipolar disorder  She receives disability for "bipolar and bad knee".    Reports prior psychiatric diagnoses of bipolar disorder. Reports history of manic episodes occurring mostly in her 61s. Reports during these episodes would experience elevated mood, decreased need for sleep, would be disorganized, with racing thoughts/flight of ideas, indiscretion. Reports she has been maintained on risperdal "for years". Currently taking risperdal 1mg  in the morning and 2mg  at bedtime. She feels risperdal is not working for her and would like to discontinue risperdal and start different medication. States she has tried seroquel before and did not like it. Case discussed with attending psychiatrist, Dr. Marval Regal, discussed discontinuing risperdal and starting Latuda 20mg  daily with supper, also consult to hospitalist for hypotension, increasing trend of creatinine, and prolonged Qtc. This was discussed with pt who is in agreement.   Associated Signs/Symptoms: Depression Symptoms:  depressed mood, hypersomnia, psychomotor retardation, fatigue, feelings of worthlessness/guilt, difficulty concentrating, hopelessness, recurrent thoughts of death, suicidal thoughts with specific plan, anxiety, loss of energy/fatigue, disturbed sleep, decreased appetite, (Hypo) Manic Symptoms:  Irritable Mood, Anxiety Symptoms:  Excessive Worry, Psychotic Symptoms:   Not applicable PTSD Symptoms: NA  Total Time spent with patient: 1 hour  Past Psychiatric History: Bipolar disorder, Anxiety, Schizoaffective disorder, depressive type  Is the patient at risk to self? Yes.    Has the patient been a risk to self in the past 6 months? No.  Has the patient been a risk to self within the distant past? Yes.    Is the patient a risk to others? No.  Has the patient been a risk to others in the past 6 months? No.  Has the patient been a risk to others within the distant past? No.   [place] Scale:  Flowsheet Row Admission (Current) from 02/12/2023  in [facility] INPATIENT BEHAVIORAL MEDICINE ED to  Hosp-Admission (Discharged) from 02/06/2023 in [facility] ED from 12/14/2022 in [facility] Emergency Department at [facility]  C-SSRS RISK CATEGORY No Risk No Risk No Risk       Prior Inpatient Therapy: Yes.    Prior Outpatient Therapy: Yes.     Alcohol Screening: 1. How often do you have a drink containing alcohol?: Never 2. How many drinks containing alcohol do you have on a typical day when you are drinking?: 1 or 2 3. How often do you have six or more drinks on one occasion?: Never AUDIT-C Score: 0 4. How often during the last year have you found that you were not able to stop drinking once you had started?: Never 5. How often during the last year have you failed to do what was normally expected from you because of drinking?: Never 6. How often during the last year have you needed a first drink in the morning to get yourself going after a heavy drinking session?: Never 7. How often during the last year have you had a feeling of guilt of remorse after drinking?: Never 8. How often during the last year have you been unable to remember what happened the night before because you had been drinking?: Never 9. Have you or someone else been injured as a result of your drinking?: No 10. Has a relative or friend or a doctor or another health worker been concerned about your drinking or suggested you cut down?: No Alcohol Use Disorder Identification Test Final Score (AUDIT): 0 Substance Abuse History in the last 12 months:  No. Consequences of Substance Abuse: Negative Previous Psychotropic Medications: Yes  Psychological Evaluations:  Unknown Past Medical History:  Past Medical History:  Diagnosis Date   Anxiety disorder    Asthma    Bipolar affective disorder (HCC)    Blood transfusion without reported diagnosis    CHF (congestive heart failure) (HCC)    COPD (chronic obstructive pulmonary disease) (HCC)    Depression    Diabetes mellitus    Migraine     Schizophrenia (HCC)     Past Surgical History:  Procedure Laterality Date   KNEE ARTHROSCOPY     x 2   KNEE SURGERY Left    Incision made and knee cleaned out   TUBAL LIGATION     Family History:  Family History  Problem Relation Age of Onset   Bipolar disorder Mother    Hypertension Father    Diabetes Father    Family Psychiatric  History: Pt denies although chart review suggests mother with bipolar disorder Tobacco Screening:  Social History   Tobacco Use  Smoking Status Every Day   Current packs/day: 1.00   Average packs/day: 1 pack/day for 29.0 years (29.0 ttl pk-yrs)   Types: Cigarettes  Smokeless Tobacco Never    BH Tobacco Counseling     Are you interested in Tobacco Cessation Medications?  No value filed. Counseled patient on smoking cessation:  No value filed. Reason Tobacco Screening Not Completed: No value filed.       Social History:  Social History   Substance and Sexual Activity  Alcohol Use Not Currently   Comment: occasionally     Social History   Substance and Sexual Activity  Drug Use No    Additional Social History: Marital status: Divorced Divorced, when?: "when I was 52 years old" Does patient have children?: Yes How many children?: 4 How is patient's  relationship with their children?: "I lost one" "it's good"                         Allergies:   Allergies  Allergen Reactions   Tramadol Nausea Only and Rash   Benadryl [Diphenhydramine] Other (See Comments)    Decreases motor skills and increases anxiety   Lactose Intolerance (Gi)    Wellbutrin [Bupropion] Other (See Comments)    rage   Nicotine Rash    Patient states she is allergic to the Nicotine patch and they cause her to break out in a rash.  She states she can smoke cigarettes.   Lab Results:  Results for orders placed or performed during the hospital encounter of 02/12/23 (from the past 48 hour(s))  Glucose, capillary     Status: Abnormal   Collection Time:  02/12/23  8:22 PM  Result Value Ref Range   Glucose-Capillary 265 (H) 70 - 99 mg/dL    Comment: Glucose reference range applies only to samples taken after fasting for at least 8 hours.   Comment 1 Notify RN   Glucose, capillary     Status: Abnormal   Collection Time: 02/13/23 12:15 PM  Result Value Ref Range   Glucose-Capillary 102 (H) 70 - 99 mg/dL    Comment: Glucose reference range applies only to samples taken after fasting for at least 8 hours.    Blood Alcohol level:  Lab Results  Component Value Date   ETH <10 08/14/2022   ETH <10 07/30/2020    Metabolic Disorder Labs:  Lab Results  Component Value Date   HGBA1C 8.4 (H) 02/07/2023   MPG 194.38 02/07/2023   MPG 269 08/27/2022   Lab Results  Component Value Date   PROLACTIN 10.6 08/31/2015   Lab Results  Component Value Date   CHOL 172 08/27/2022   TRIG 214 (H) 09/19/2022   HDL 80 08/27/2022   CHOLHDL 2.2 08/27/2022   VLDL 13 08/27/2022   LDLCALC 79 08/27/2022   LDLCALC 128 (H) 12/20/2018    Current Medications: Current Facility-Administered Medications  Medication Dose Route Frequency Provider Last Rate Last Admin   acetaminophen (TYLENOL) tablet 650 mg  650 mg Oral Q6H PRN Starkes-Perry, Juel Burrow, FNP       alum & mag hydroxide-simeth (MAALOX/MYLANTA) 200-200-20 MG/5ML suspension 30 mL  30 mL Oral Q4H PRN Starkes-Perry, Juel Burrow, FNP       bumetanide (BUMEX) tablet 1 mg  1 mg Oral BID Starkes-Perry, Juel Burrow, FNP       cholecalciferol (VITAMIN D3) 25 MCG (1000 UNIT) tablet 1,000 Units  1,000 Units Oral Daily Starkes-Perry, Juel Burrow, FNP       docusate sodium (COLACE) capsule 100 mg  100 mg Oral BID Maryagnes Amos, FNP   100 mg at 02/12/23 2154   [START ON 02/14/2023] feeding supplement (ENSURE ENLIVE / ENSURE PLUS) liquid 237 mL  237 mL Oral BID BM Mikey College T, MD       haloperidol (HALDOL) tablet 5 mg  5 mg Oral TID PRN Maryagnes Amos, FNP       Or   haloperidol lactate (HALDOL) injection 5 mg   5 mg Intramuscular TID PRN Maryagnes Amos, FNP       hydrOXYzine (ATARAX) tablet 25 mg  25 mg Oral TID PRN Sindy Guadeloupe, NP       influenza vac split trivalent PF (FLULAVAL) injection 0.5 mL  0.5 mL Intramuscular Tomorrow-1000 Parmar, [place],  MD       insulin aspart (novoLOG) injection 0-15 Units  0-15 Units Subcutaneous TID WC Starkes-Perry, Juel Burrow, FNP       insulin aspart (novoLOG) injection 0-5 Units  0-5 Units Subcutaneous QHS Maryagnes Amos, FNP   3 Units at 02/12/23 2154   [START ON 02/14/2023] insulin glargine-yfgn (SEMGLEE) injection 15 Units  15 Units Subcutaneous Daily Zhang, Ilda Foil T, MD       ipratropium-albuterol (DUONEB) 0.5-2.5 (3) MG/3ML nebulizer solution 3 mL  3 mL Nebulization Q6H PRN Starkes-Perry, Juel Burrow, FNP       lidocaine (XYLOCAINE) 5 % ointment   Topical QID PRN Starkes-Perry, Juel Burrow, FNP       LORazepam (ATIVAN) tablet 2 mg  2 mg Oral TID PRN Maryagnes Amos, FNP       Or   LORazepam (ATIVAN) injection 2 mg  2 mg Intramuscular TID PRN Maryagnes Amos, FNP       lurasidone (LATUDA) tablet 20 mg  20 mg Oral Q supper Lauree Chandler, NP       magnesium hydroxide (MILK OF MAGNESIA) suspension 30 mL  30 mL Oral Daily PRN Starkes-Perry, Juel Burrow, FNP       midodrine (PROAMATINE) tablet 5 mg  5 mg Oral TID WC Starkes-Perry, Juel Burrow, FNP   5 mg at 02/13/23 1443   mometasone-formoterol (DULERA) 200-5 MCG/ACT inhaler 2 puff  2 puff Inhalation BID Starkes-Perry, Juel Burrow, FNP       nicotine polacrilex (NICORETTE) gum 2 mg  2 mg Oral PRN Maryagnes Amos, FNP       oxyCODONE (Oxy IR/ROXICODONE) immediate release tablet 10 mg  10 mg Oral Q6H PRN Starkes-Perry, Juel Burrow, FNP       pneumococcal 20-valent conjugate vaccine (PREVNAR 20) injection 0.5 mL  0.5 mL Intramuscular Tomorrow-1000 Lewanda Rife, MD       polyethylene glycol (MIRALAX / GLYCOLAX) packet 17 g  17 g Oral Daily Starkes-Perry, Takia S, FNP       rosuvastatin (CRESTOR) tablet  20 mg  20 mg Oral Daily Starkes-Perry, Juel Burrow, FNP       senna (SENOKOT) tablet 8.6 mg  1 tablet Oral BID Cinderella, Margaret A       PTA Medications: Medications Prior to Admission  Medication Sig Dispense Refill Last Dose   acetaminophen (TYLENOL) 325 MG tablet Take 1 tablet (325 mg total) by mouth every 6 (six) hours as needed for headache or mild pain. (Patient not taking: Reported on 02/06/2023) 60 tablet 0    albuterol (VENTOLIN HFA) 108 (90 Base) MCG/ACT inhaler Inhale 2 puffs into the lungs every 6 (six) hours as needed for wheezing or shortness of breath. 18 g 2    blood glucose meter kit and supplies Dispense based on patient and insurance preference. Use up to four times daily as directed. (FOR ICD-10 E10.9, E11.9). 1 each 0    bumetanide (BUMEX) 1 MG tablet Take 1 tablet (1 mg total) by mouth 2 (two) times daily. 30 tablet 1    vitamin D3 (CHOLECALCIFEROL) 25 MCG tablet Take 1 tablet (1,000 Units total) by mouth daily. 90 tablet 1    glucose blood (ONETOUCH VERIO) test strip Use three times daily as directed to check blood sugar 100 each 0    insulin glargine, 1 Unit Dial, (TOUJEO SOLOSTAR) 300 UNIT/ML Solostar Pen Inject 28 Units into the skin at bedtime. 3 mL 11    Insulin Pen Needle 32G X 4  MM MISC Use As Directed 100 each 0    Insulin Pen Needle 32G X 4 MM MISC Use 3 (three) times daily. 100 each 0    ipratropium-albuterol (DUONEB) 0.5-2.5 (3) MG/3ML SOLN inhale 3 mLs by nebulization every 6 (six) hours as needed. 360 mL 0    Lancet Devices (ONETOUCH DELICA PLUS LANCING) MISC Use 3 (three) times daily. 1 each 0    midodrine (PROAMATINE) 5 MG tablet Take 1 tablet (5 mg total) by mouth 3 (three) times daily with meals. 90 tablet 1    mometasone-formoterol (DULERA) 200-5 MCG/ACT AERO Inhale 2 puffs into the lungs 2 (two) times daily. 13 g 3    nicotine polacrilex (NICORETTE) 2 MG gum Take 1 each (2 mg total) by mouth as needed for smoking cessation. 100 tablet 0    nitroGLYCERIN  (NITROSTAT) 0.4 MG SL tablet Place 1 tablet (0.4 mg total) under the tongue every 5 (five) minutes as needed for chest pain. 50 tablet 0    OneTouch Delica Lancets 33G MISC Use 3 (three) times daily as directed to check blood sugar 100 each 0    oxyCODONE-acetaminophen (PERCOCET) 10-325 MG tablet Take 1 tablet by mouth every 6 (six) hours as needed for pain. 15 tablet 0    potassium chloride (KLOR-CON M) 10 MEQ tablet Take 1 tablet (10 mEq total) by mouth daily. (Patient not taking: Reported on 02/06/2023) 30 tablet 8    risperiDONE (RISPERDAL) 2 MG tablet Take 1/2 tablet in the morning and one tablet in the evening. 45 tablet 3    rosuvastatin (CRESTOR) 20 MG tablet Take 1 tablet (20 mg total) by mouth daily. 30 tablet 1     Musculoskeletal: Strength & Muscle Tone: within normal limits Gait & Station: normal Patient leans: N/A  Psychiatric Specialty Exam:  Presentation  General Appearance:  Appropriate for Environment  Eye Contact: Fair  Speech: Clear and Coherent; Normal Rate  Speech Volume: Normal  Handedness:Right   Mood and Affect  Mood: -- ("fine")  Affect: Flat   Thought Process  Thought Processes: Coherent; Goal Directed; Linear  Duration of Psychotic Symptoms:N/A Past Diagnosis of Schizophrenia or Psychoactive disorder: Yes Descriptions of Associations:Intact  Orientation:Full (Time, Place and Person)  Thought Content:WDL  Hallucinations:Hallucinations: None  Ideas of Reference:None  Suicidal Thoughts:Suicidal Thoughts: Yes, Active SI Active Intent and/or Plan: With Plan  Homicidal Thoughts:Homicidal Thoughts: No  Sensorium  Memory: Immediate Good; Recent Good; Remote Good  Judgment: Poor  Insight: Poor  Executive Functions  Concentration: Good  Attention Span: Good  Recall: Good  Fund of Knowledge: Good  Language: Good  Psychomotor Activity  Psychomotor Activity: Psychomotor Activity: Normal  Assets  Assets: Desire  for Improvement; Financial Resources/Insurance; Housing; Resilience; Communication Skills; Social Support  Sleep  Sleep: Sleep: Poor  Physical Exam: Physical Exam Constitutional:      General: She is not in acute distress.    Appearance: She is not ill-appearing, toxic-appearing or diaphoretic.  Eyes:     General: No scleral icterus. Cardiovascular:     Rate and Rhythm: Normal rate.  Pulmonary:     Effort: Pulmonary effort is normal. No respiratory distress.  Neurological:     Mental Status: She is alert and oriented to person, place, and time.  Psychiatric:        Attention and Perception: Attention and perception normal.        Mood and Affect: Mood normal. Affect is flat.        Speech: Speech normal.  Behavior: Behavior normal. Behavior is cooperative.        Thought Content: Thought content is not paranoid or delusional. Thought content includes suicidal ideation. Thought content does not include homicidal ideation. Thought content includes suicidal plan. Thought content does not include homicidal plan.    Review of Systems  Constitutional:  Negative for chills and fever.  Respiratory:  Negative for shortness of breath.   Cardiovascular:  Negative for chest pain and palpitations.  Gastrointestinal:  Negative for abdominal pain.  Neurological:  Negative for headaches.  Psychiatric/Behavioral:  Positive for depression and suicidal ideas.    Blood pressure (!) 104/59, pulse 87, temperature (!) 97.3 F (36.3 C), resp. rate 19, height 5\' 5"  (1.651 m), weight 68 kg, SpO2 97%. Body mass index is 24.94 kg/m.  Treatment Plan Summary: Daily contact with patient to assess and evaluate symptoms and progress in treatment, Medication management, and Plan    Bipolar 1 disorder -Discontinue risperdal 1mg  in the morning and 2mg  at bedtime -Start Latuda 20mg  oral daily with supper  Nicotine cessation -nicorette 2mg  oral as needed smoking cessation  Agitation PRNs -Haldol  5mg  PO or IM 3 times daily PRN agitation -Ativan 2mg  PO or IM 3 times daily PRN agitation  Anxiety -Hydroxyzine 25mg  oral 3 times daily PRN anxiety  Observation Level/Precautions:  15 minute checks  Laboratory:   Lipid panel  Psychotherapy:    Medications:    Consultations:  Hospitalist  Discharge Concerns:    Estimated LOS:  Other:     Physician Treatment Plan for Primary Diagnosis: Bipolar I disorder, most recent episode depressed (HCC) Long Term Goal(s): Improvement in symptoms so as ready for discharge  Short Term Goals: Ability to identify changes in lifestyle to reduce recurrence of condition will improve, Ability to verbalize feelings will improve, Ability to disclose and discuss suicidal ideas, Ability to demonstrate self-control will improve, Ability to identify and develop effective coping behaviors will improve, and Ability to identify triggers associated with substance abuse/mental health issues will improve  I certify that inpatient services furnished can reasonably be expected to improve the patient's condition.    Lauree Chandler, NP 9/5/20244:54 PM

## 2023-02-13 NOTE — Group Note (Signed)
Date:  02/13/2023 Time:  9:58 PM  Group Topic/Focus:  Wrap-Up Group:   The focus of this group is to help patients review their daily goal of treatment and discuss progress on daily workbooks.    Participation Level:  Active  Participation Quality:  Appropriate and Attentive  Affect:  Appropriate  Cognitive:  Alert and Appropriate  Insight: Appropriate and Good  Engagement in Group:  Developing/Improving  Modes of Intervention:  Discussion  Additional Comments:     Katlin Bortner 02/13/2023, 9:58 PM

## 2023-02-13 NOTE — Progress Notes (Signed)
Suicide Risk Assessment  Admission Assessment    [facility] Admission Suicide Risk Assessment   Nursing information obtained from:  Patient Demographic factors:  Caucasian Current Mental Status:  Suicidal ideation indicated by patient, Suicidal ideation indicated by others Loss Factors:  Decline in physical health Historical Factors:  NA Risk Reduction Factors:  Living with another person, especially a relative  Total Time spent with patient: 1 hour Principal Problem: Bipolar I disorder, most recent episode depressed (HCC) Diagnosis:  Principal Problem:   Bipolar I disorder, most recent episode depressed (HCC) Active Problems:   Chronic systolic CHF (congestive heart failure) (HCC)  Subjective Data: 52 y/o female with history of COPD, Chronic HFrEF, bipolar disorder, admitted to inpatient psychiatry after verbalizing suicidal ideations with plan to "blow brains out". On assessment today, pt reports she was admitted because during her medical admission "I think I said I'd just rather be dead than deal with this shit". When asked about this, admits to verbalizing she would shoot herself with a firearm.   Continued Clinical Symptoms:  Alcohol Use Disorder Identification Test Final Score (AUDIT): 0 The "Alcohol Use Disorders Identification Test", Guidelines for Use in Primary Care, Second Edition.  World Science writer [facility]). Score between 0-7:  no or low risk or alcohol related problems. Score between 8-15:  moderate risk of alcohol related problems. Score between 16-19:  high risk of alcohol related problems. Score 20 or above:  warrants further diagnostic evaluation for alcohol dependence and treatment.  CLINICAL FACTORS:   Bipolar Disorder:   Depressive phase Previous Psychiatric Diagnoses and Treatments  Musculoskeletal: Strength & Muscle Tone: within normal limits Gait & Station: normal Patient leans: N/A  Psychiatric Specialty Exam:  Presentation  General Appearance:   Appropriate for Environment  Eye Contact: Fair  Speech: Clear and Coherent; Normal Rate  Speech Volume: Normal  Handedness:Right   Mood and Affect  Mood: -- ("fine")  Affect: Flat   Thought Process  Thought Processes: Coherent; Goal Directed; Linear  Descriptions of Associations:Intact  Orientation:Full (Time, Place and Person)  Thought Content:WDL  History of Schizophrenia/Schizoaffective disorder:Yes Duration of Psychotic Symptoms:NA Hallucinations:Hallucinations: None  Ideas of Reference:None  Suicidal Thoughts:Suicidal Thoughts: Yes, Active SI Active Intent and/or Plan: With Plan  Homicidal Thoughts:Homicidal Thoughts: No   Sensorium  Memory: Immediate Good; Recent Good; Remote Good  Judgment: Poor  Insight: Poor   Executive Functions  Concentration: Good  Attention Span: Good  Recall: Good  Fund of Knowledge: Good  Language: Good   Psychomotor Activity  Psychomotor Activity: Psychomotor Activity: Normal   Assets  Assets: Desire for Improvement; Financial Resources/Insurance; Housing; Resilience; Communication Skills; Social Support   Sleep  Sleep: Sleep: Poor    Physical Exam: Physical Exam see admission ROS see admission Blood pressure (!) 104/59, pulse 87, temperature (!) 97.3 F (36.3 C), resp. rate 19, height 5\' 5"  (1.651 m), weight 68 kg, SpO2 97%. Body mass index is 24.94 kg/m.   COGNITIVE FEATURES THAT CONTRIBUTE TO RISK:  Closed-mindedness    SUICIDE RISK:  Severe:  Frequent, intense, and enduring suicidal ideation, specific plan, no subjective intent, but some objective markers of intent (i.e., choice of lethal method), the method is accessible, some limited preparatory behavior, evidence of impaired self-control, severe dysphoria/symptomatology, multiple risk factors present, and few if any protective factors, particularly a lack of social support.  PLAN OF CARE:  Daily contact with patient to  assess and evaluate symptoms and progress in treatment Medication management  I certify that inpatient services furnished can  reasonably be expected to improve the patient's condition.   Lauree Chandler, NP 02/13/2023, 5:02 PM

## 2023-02-13 NOTE — Progress Notes (Signed)
Patient's BS 102 - declined lunch but when I went in to check she was eating trail mix with m&m's. Talked to her about healthy choices and encouraged her to get up and participate in group and lunch.  A short time later she came out for lunch. Ambulates well with steady gait. States she sometimes uses wheelchair d/t her CHF.

## 2023-02-13 NOTE — Group Note (Signed)
Date:  02/13/2023 Time:  5:52 PM  Group Topic/Focus:  Activity/Outdoor Recreation    Participation Level:  Did Not Attend   Lynelle Smoke [facility] 02/13/2023, 5:52 PM
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
## 2023-02-13 NOTE — Progress Notes (Signed)
Patient asked by this nurse and mht to come to get all her meds and insulin shot. Would not get out of bed. Not participating with group.

## 2023-02-13 NOTE — [facility] Counselor (Signed)
Patient declining aftercare referrals at this time.  Penni Homans, MSW, LCSW 02/13/2023 2:05 PM

## 2023-02-13 NOTE — Progress Notes (Signed)
Patient is IVC'd to BMU for MDD and anxiety.  Originally admitted to [facility] but was transferred here. Has CHF, DM and states uses a wheelchair at time when her CHF is bad. Patient skip breakfast and dinner but after speaking with her today she did go out for lunch. Was seen by medical oncall today who believes she is getting dehydrated and ordered her bumex held tonight only. Also order an EKG for today which he received and another for tomorrow.as well as orthostatics tonight at 8pm and again at 8am. Patient denied S/I, H/I and AVH as well as anxiety and depression but appears depressed.  Will continue to monitor.

## 2023-02-13 NOTE — Group Note (Signed)
Date:  02/13/2023 Time:  10:15 AM  Group Topic/Focus:  Goals Group:   The focus of this group is to help patients establish daily goals to achieve during treatment and discuss how the patient can incorporate goal setting into their daily lives to aide in recovery.    Participation Level:  Did Not Attend   Lynelle Smoke [facility] 02/13/2023, 10:15 AM

## 2023-02-13 NOTE — Group Note (Signed)
[facility] LCSW Group Therapy Note   Group Date: 02/13/2023 Start Time: 1315 End Time: 1415   Type of Therapy/Topic:  Group Therapy:  Balance in Life  Participation Level:  Did Not Attend   Description of Group:    This group will address the concept of balance and how it feels and looks when one is unbalanced. Patients will be encouraged to process areas in their lives that are out of balance, and identify reasons for remaining unbalanced. Facilitators will guide patients utilizing problem- solving interventions to address and correct the stressor making their life unbalanced. Understanding and applying boundaries will be explored and addressed for obtaining  and maintaining a balanced life. Patients will be encouraged to explore ways to assertively make their unbalanced needs known to significant others in their lives, using other group members and facilitator for support and feedback.  Therapeutic Goals: Patient will identify two or more emotions or situations they have that consume much of in their lives. Patient will identify signs/triggers that life has become out of balance:  Patient will identify two ways to set boundaries in order to achieve balance in their lives:  Patient will demonstrate ability to communicate their needs through discussion and/or role plays  Summary of Patient Progress: X   Therapeutic Modalities:   Cognitive Behavioral Therapy Solution-Focused Therapy Assertiveness Training   Glenis Smoker, LCSW

## 2023-02-14 ENCOUNTER — Encounter: Payer: Self-pay | Admitting: Psychiatry

## 2023-02-14 ENCOUNTER — Other Ambulatory Visit (HOSPITAL_COMMUNITY): Payer: Self-pay

## 2023-02-14 DIAGNOSIS — Z794 Long term (current) use of insulin: Secondary | ICD-10-CM

## 2023-02-14 DIAGNOSIS — F3130 Bipolar disorder, current episode depressed, mild or moderate severity, unspecified: Secondary | ICD-10-CM | POA: Diagnosis not present

## 2023-02-14 DIAGNOSIS — J42 Unspecified chronic bronchitis: Secondary | ICD-10-CM

## 2023-02-14 DIAGNOSIS — I959 Hypotension, unspecified: Secondary | ICD-10-CM

## 2023-02-14 DIAGNOSIS — E119 Type 2 diabetes mellitus without complications: Secondary | ICD-10-CM

## 2023-02-14 DIAGNOSIS — E861 Hypovolemia: Secondary | ICD-10-CM

## 2023-02-14 LAB — GLUCOSE, CAPILLARY
Glucose-Capillary: 162 mg/dL — ABNORMAL HIGH (ref 70–99)
Glucose-Capillary: 131 mg/dL — ABNORMAL HIGH (ref 70–99)
Glucose-Capillary: 140 mg/dL — ABNORMAL HIGH (ref 70–99)
Glucose-Capillary: 323 mg/dL — ABNORMAL HIGH (ref 70–99)

## 2023-02-14 LAB — BASIC METABOLIC PANEL
Anion gap: 7 (ref 5–15)
BUN: 33 mg/dL — ABNORMAL HIGH (ref 6–20)
CO2: 25 mmol/L (ref 22–32)
Calcium: 8.5 mg/dL — ABNORMAL LOW (ref 8.9–10.3)
Chloride: 108 mmol/L (ref 98–111)
Creatinine, Ser: 0.64 mg/dL (ref 0.44–1.00)
GFR, Estimated: >60 mL/min (ref >60)
Glucose, Bld: 135 mg/dL — ABNORMAL HIGH (ref 70–99)
Potassium: 4.1 mmol/L (ref 3.5–5.1)
Sodium: 140 mmol/L (ref 135–145)

## 2023-02-14 LAB — LIPID PANEL
Cholesterol: 196 mg/dL (ref 0–200)
HDL: 47 mg/dL (ref >40)
LDL Cholesterol: 134 mg/dL — ABNORMAL HIGH (ref 0–99)
Total CHOL/HDL Ratio: 4.2 ratio
Triglycerides: 77 mg/dL (ref <150)
VLDL: 15 mg/dL (ref 0–40)

## 2023-02-14 LAB — BRAIN NATRIURETIC PEPTIDE: B Natriuretic Peptide: 446.8 pg/mL — ABNORMAL HIGH (ref 0.0–100.0)

## 2023-02-14 LAB — CORTISOL: Cortisol, Plasma: 23.2 ug/dL

## 2023-02-14 MED ORDER — BUMETANIDE 1 MG PO TABS
1.0000 mg | ORAL_TABLET | Freq: Every day | ORAL | Status: DC
  Administered 2023-02-15 – 2023-02-22 (×8): 1 mg via ORAL
  Filled 2023-02-14 (×8): qty 1

## 2023-02-14 MED ORDER — ZOLPIDEM TARTRATE 5 MG PO TABS
5.0000 mg | ORAL_TABLET | Freq: Every day | ORAL | Status: DC
  Administered 2023-02-14 – 2023-02-21 (×8): 5 mg via ORAL
  Filled 2023-02-14 (×8): qty 1

## 2023-02-14 NOTE — Hospital Course (Addendum)
Taken from consult note.   [name] [name] is a 52 y.o. female with past medical history of chronic HFrEF with LVEF 20-25%, COPD, IDDM, was admitted to psychiatry unit for management of depression.  Patient was recently hospitalized for CHF decompensation secondary to noncompliant with CHF medications.  Her CHF medication were adjusted during hospitalization, due to persistent orthostatic hypotension most of her blood pressure medication discontinued.  She was started on midodrine and Bumex 1 mg twice daily (cut down from 2 mg twice daily and discharged to psychiatry unit.  Medical team was asked to evaluate the patient for persistent borderline  hypotension, and slowly increase of creatinine reading on BMP as well as prolonged QTc interval.  Today, psychiatry unit nurse reported that patient blood pressure SBP in the lower 80s and patient reports feeling lethargic.  Patient denies any shortness of breath chest pain lightheadedness at this point.   She was appeared volume contracted, diuretic was held yesterday.  9/6: Vital stable with blood pressure of 119/75.  Creatinine improved to 0.64 from 1.18. Lipid panel with LDL of 134, goal is less than 70.  Patient recently started on Crestor. Clinically appears dry, decreasing the dose of Bumex to daily as she continued to have some dizziness.  9/7: Vital stable, patient with improved dizziness after decreasing the dose of Bumex to once daily which we will continue at this time.  9/8: Remains stable and euvolemic on current dose of Bumex.  Dizziness resolved but she mostly stays in bed stating that I am too depressed to do anything.  9/9: Patient remains stable and clinically appears euvolemic on low-dose of Bumex at 1 mg daily.  Dizziness has been resolved.  Internal medicine service will sign off at this time, continue current dose of Bumex and she need to follow-up with her cardiologist for further recommendations. Please do not hesitate to call us  back if needed

## 2023-02-14 NOTE — Group Note (Signed)
Recreation Therapy Group Note   Group Topic:Leisure Education  Group Date: 02/14/2023 Start Time: 1000 End Time: 1100 Facilitators: Rosina Lowenstein, LRT, CTRS Location:  Craft Room  Group Description: Leisure. Patients were given the option to choose from singing karaoke, coloring mandalas, using oil pastels, journaling, or playing with play-doh. LRT and pts discussed the meaning of leisure, the importance of participating in leisure during their free time/when they're outside of the hospital, as well as how our leisure interests can also serve as coping skills.   Goal Area(s) Addressed:  Patient will identify a current leisure interest.  Patient will learn the definition of "leisure". Patient will practice making a positive decision. Patient will have the opportunity to try a new leisure activity. Patient will communicate with peers and LRT.    Affect/Mood: N/A   Participation Level: Did not attend    Clinical Observations/Individualized Feedback: Patient did not attend group.  Plan: Continue to engage patient in RT group sessions 2-3x/week.   Rosina Lowenstein, LRT, CTRS 02/14/2023 11:55 AM

## 2023-02-14 NOTE — Assessment & Plan Note (Signed)
CBG within goal. -Continue Semglee and SSI

## 2023-02-14 NOTE — Assessment & Plan Note (Signed)
Blood pressure currently within goal. -Continue with midodrine

## 2023-02-14 NOTE — Assessment & Plan Note (Signed)
-  Being managed by psychiatry
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
## 2023-02-14 NOTE — Group Note (Signed)
Date:  02/14/2023 Time:  10:04 AM  Group Topic/Focus:  Goals Group:   The focus of this group is to help patients establish daily goals to achieve during treatment and discuss how the patient can incorporate goal setting into their daily lives to aide in recovery.    Participation Level:  Did Not Attend  Lynelle Smoke [facility] 02/14/2023, 10:04 AM
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
## 2023-02-14 NOTE — Progress Notes (Signed)
[facility] MD Progress Note  02/14/2023 2:39 PM CHALSEY GARLOW  MRN:  [id number]  Subjective:  Notes, vital signs, and labs reviewed prior to assessment.  52 yo female admitted for depression with suicidal ideations.  On evaluation, she was resting quietly in her room.  Reported poor sleep last night because she did not have her Ambien, medication ordered after checking with the PDMP.  5/10 depression with no suicidal ideations.  "Lot of anxiety" with no panic attacks while inpatient.  Appetite is fair because of the "bland" food.  Denies side effects of her medications or other complaints.    Principal Problem: Bipolar I disorder, most recent episode depressed (HCC) Diagnosis: Active Problems:   DM2 (diabetes mellitus, type 2) (HCC)   Chronic systolic CHF (congestive heart failure) (HCC)   COPD (chronic obstructive pulmonary disease) (HCC)   Prolonged QT interval   Hypotension  Total Time spent with patient: 30 minutes  Past Psychiatric History: bipolar d/o  Past Medical History:  Past Medical History:  Diagnosis Date   Anxiety disorder    Asthma    Bipolar affective disorder (HCC)    Blood transfusion without reported diagnosis    CHF (congestive heart failure) (HCC)    COPD (chronic obstructive pulmonary disease) (HCC)    Depression    Diabetes mellitus    Migraine    Schizophrenia (HCC)     Past Surgical History:  Procedure Laterality Date   KNEE ARTHROSCOPY     x 2   KNEE SURGERY Left    Incision made and knee cleaned out   TUBAL LIGATION     Family History:  Family History  Problem Relation Age of Onset   Bipolar disorder Mother    Hypertension Father    Diabetes Father    Family Psychiatric  History: see above Social History:  Social History   Substance and Sexual Activity  Alcohol Use Not Currently   Comment: occasionally     Social History   Substance and Sexual Activity  Drug Use No    Social History   Socioeconomic History   Marital status: Divorced     Spouse name: Not on file   Number of children: Not on file   Years of education: Not on file   Highest education level: Not on file  Occupational History   Not on file  Tobacco Use   Smoking status: Every Day    Current packs/day: 1.00    Average packs/day: 1 pack/day for 29.0 years (29.0 ttl pk-yrs)    Types: Cigarettes   Smokeless tobacco: Never  Vaping Use   Vaping status: Former  Substance and Sexual Activity   Alcohol use: Not Currently    Comment: occasionally   Drug use: No   Sexual activity: Yes    Birth control/protection: Surgical  Other Topics Concern   Not on file  Social History Narrative   Patient lives in [place] with her 2 kids- 16 in 15 years old.   She has total 4 kids.   She is a single mom.         Social Determinants of Health   Financial Resource Strain: High Risk (09/30/2022)   Overall Financial Resource Strain (CARDIA)    Difficulty of Paying Living Expenses: Hard  Food Insecurity: No Food Insecurity (02/12/2023)   Hunger Vital Sign    Worried About Running Out of Food in the Last Year: Never true    Ran Out of Food in the Last Year: Never true  Recent Concern: Food Insecurity - Food Insecurity Present (02/07/2023)   Hunger Vital Sign    Worried About Running Out of Food in the Last Year: Sometimes true    Ran Out of Food in the Last Year: Sometimes true  Transportation Needs: No Transportation Needs (02/12/2023)   PRAPARE - Administrator, Civil Service (Medical): No    Lack of Transportation (Non-Medical): No  Recent Concern: Transportation Needs - Unmet Transportation Needs (02/06/2023)   PRAPARE - Transportation    Lack of Transportation (Medical): Yes    Lack of Transportation (Non-Medical): Yes  Physical Activity: Not on file  Stress: No Stress Concern Present (01/07/2022)   Received from [facility], [facility]   [employer] [facility] - Occupational Stress Questionnaire    Feeling of Stress : Not at  all  Social Connections: Unknown (10/19/2021)   [facility], [facility]   Social Network    Social Network: Not on file   Additional Social History:                         Sleep: Poor  Appetite:  Fair  Current Medications: Current Facility-Administered Medications  Medication Dose Route Frequency Provider Last Rate Last Admin   acetaminophen (TYLENOL) tablet 650 mg  650 mg Oral Q6H PRN Maryagnes Amos, FNP   650 mg at 02/13/23 2137   alum & mag hydroxide-simeth (MAALOX/MYLANTA) 200-200-20 MG/5ML suspension 30 mL  30 mL Oral Q4H PRN Maryagnes Amos, FNP       [START ON 02/15/2023] bumetanide (BUMEX) tablet 1 mg  1 mg Oral Daily Arnetha Courser, MD       cholecalciferol (VITAMIN D3) 25 MCG (1000 UNIT) tablet 1,000 Units  1,000 Units Oral Daily Maryagnes Amos, FNP   1,000 Units at 02/14/23 0802   docusate sodium (COLACE) capsule 100 mg  100 mg Oral BID Maryagnes Amos, FNP   100 mg at 02/14/23 0802   feeding supplement (ENSURE ENLIVE / ENSURE PLUS) liquid 237 mL  237 mL Oral BID BM Mikey College T, MD       haloperidol (HALDOL) tablet 5 mg  5 mg Oral TID PRN Maryagnes Amos, FNP       Or   haloperidol lactate (HALDOL) injection 5 mg  5 mg Intramuscular TID PRN Rosario Adie, Juel Burrow, FNP       hydrOXYzine (ATARAX) tablet 25 mg  25 mg Oral TID PRN Sindy Guadeloupe, NP       influenza vac split trivalent PF (FLULAVAL) injection 0.5 mL  0.5 mL Intramuscular Tomorrow-1000 Marval Regal, Meenakshi, MD       insulin aspart (novoLOG) injection 0-15 Units  0-15 Units Subcutaneous TID WC Maryagnes Amos, FNP   3 Units at 02/14/23 0835   insulin aspart (novoLOG) injection 0-5 Units  0-5 Units Subcutaneous QHS Maryagnes Amos, FNP   2 Units at 02/13/23 2129   insulin glargine-yfgn (SEMGLEE) injection 15 Units  15 Units Subcutaneous Daily Mikey College T, MD   15 Units at 02/14/23 0800   ipratropium-albuterol (DUONEB) 0.5-2.5 (3) MG/3ML  nebulizer solution 3 mL  3 mL Nebulization Q6H PRN Starkes-Perry, Juel Burrow, FNP       lidocaine (XYLOCAINE) 5 % ointment   Topical QID PRN Starkes-Perry, Juel Burrow, FNP       LORazepam (ATIVAN) tablet 2 mg  2 mg Oral TID PRN Rosario Adie Juel Burrow, FNP  Or   LORazepam (ATIVAN) injection 2 mg  2 mg Intramuscular TID PRN Maryagnes Amos, FNP       lurasidone (LATUDA) tablet 20 mg  20 mg Oral Q supper Lauree Chandler, NP       magnesium hydroxide (MILK OF MAGNESIA) suspension 30 mL  30 mL Oral Daily PRN Starkes-Perry, Juel Burrow, FNP       midodrine (PROAMATINE) tablet 5 mg  5 mg Oral TID WC Maryagnes Amos, FNP   5 mg at 02/14/23 1122   mometasone-formoterol (DULERA) 200-5 MCG/ACT inhaler 2 puff  2 puff Inhalation BID Maryagnes Amos, FNP   2 puff at 02/14/23 0759   nicotine polacrilex (NICORETTE) gum 2 mg  2 mg Oral PRN Maryagnes Amos, FNP       oxyCODONE (Oxy IR/ROXICODONE) immediate release tablet 10 mg  10 mg Oral Q6H PRN Starkes-Perry, Juel Burrow, FNP       pneumococcal 20-valent conjugate vaccine (PREVNAR 20) injection 0.5 mL  0.5 mL Intramuscular Tomorrow-1000 Lewanda Rife, MD       polyethylene glycol (MIRALAX / GLYCOLAX) packet 17 g  17 g Oral Daily Starkes-Perry, Takia S, FNP       rosuvastatin (CRESTOR) tablet 20 mg  20 mg Oral Daily Maryagnes Amos, FNP   20 mg at 02/14/23 0800   senna (SENOKOT) tablet 8.6 mg  1 tablet Oral BID Cinderella, Margaret A   8.6 mg at 02/14/23 0800   zolpidem (AMBIEN) tablet 5 mg  5 mg Oral QHS Charm Rings, NP        Lab Results:  Results for orders placed or performed during the hospital encounter of 02/12/23 (from the past 48 hour(s))  Glucose, capillary     Status: Abnormal   Collection Time: 02/12/23  8:22 PM  Result Value Ref Range   Glucose-Capillary 265 (H) 70 - 99 mg/dL    Comment: Glucose reference range applies only to samples taken after fasting for at least 8 hours.   Comment 1 Notify RN    Glucose, capillary     Status: Abnormal   Collection Time: 02/13/23 12:15 PM  Result Value Ref Range   Glucose-Capillary 102 (H) 70 - 99 mg/dL    Comment: Glucose reference range applies only to samples taken after fasting for at least 8 hours.  Glucose, capillary     Status: Abnormal   Collection Time: 02/13/23  5:46 PM  Result Value Ref Range   Glucose-Capillary 110 (H) 70 - 99 mg/dL    Comment: Glucose reference range applies only to samples taken after fasting for at least 8 hours.  Glucose, capillary     Status: Abnormal   Collection Time: 02/13/23  9:25 PM  Result Value Ref Range   Glucose-Capillary 204 (H) 70 - 99 mg/dL    Comment: Glucose reference range applies only to samples taken after fasting for at least 8 hours.  Lipid panel     Status: Abnormal   Collection Time: 02/14/23  6:18 AM  Result Value Ref Range   Cholesterol 196 0 - 200 mg/dL   Triglycerides 77 <846 mg/dL   HDL 47 >96 mg/dL   Total CHOL/HDL Ratio 4.2 RATIO   VLDL 15 0 - 40 mg/dL   LDL Cholesterol 295 (H) 0 - 99 mg/dL    Comment:        Total Cholesterol/HDL:CHD Risk Coronary Heart Disease Risk Table  Men   Women  1/2 Average Risk   3.4   3.3  Average Risk       5.0   4.4  2 X Average Risk   9.6   7.1  3 X Average Risk  23.4   11.0        Use the calculated Patient Ratio above and the CHD Risk Table to determine the patient's CHD Risk.        ATP III CLASSIFICATION (LDL):  <100     mg/dL   Optimal  161-096  mg/dL   Near or Above                    Optimal  130-159  mg/dL   Borderline  045-409  mg/dL   High  >811     mg/dL   Very High Performed at [facility], [address] [place], [place] [address]   Basic metabolic panel     Status: Abnormal   Collection Time: 02/14/23  6:18 AM  Result Value Ref Range   Sodium 140 135 - 145 mmol/L   Potassium 4.1 3.5 - 5.1 mmol/L   Chloride 108 98 - 111 mmol/L   CO2 25 22 - 32 mmol/L   Glucose, Bld 135 (H) 70 - 99 mg/dL     Comment: Glucose reference range applies only to samples taken after fasting for at least 8 hours.   BUN 33 (H) 6 - 20 mg/dL   Creatinine, Ser 2.95 0.44 - 1.00 mg/dL   Calcium 8.5 (L) 8.9 - 10.3 mg/dL   GFR, Estimated >62 >13 mL/min    Comment: (NOTE) Calculated using the CKD-EPI Creatinine Equation (2021)    Anion gap 7 5 - 15    Comment: Performed at [facility], [address] [place], [place] [address]  Cortisol     Status: None   Collection Time: 02/14/23  6:18 AM  Result Value Ref Range   Cortisol, Plasma 23.2 ug/dL    Comment: (NOTE) AM    6.7 - 22.6 ug/dL PM   <84.6       ug/dL Performed at [facility], [address] [place], [place] [address]   Brain natriuretic peptide     Status: Abnormal   Collection Time: 02/14/23  6:18 AM  Result Value Ref Range   B Natriuretic Peptide 446.8 (H) 0.0 - 100.0 pg/mL    Comment: Performed at [facility], [address] [place], [place] [address]  Glucose, capillary     Status: Abnormal   Collection Time: 02/14/23  7:58 AM  Result Value Ref Range   Glucose-Capillary 162 (H) 70 - 99 mg/dL    Comment: Glucose reference range applies only to samples taken after fasting for at least 8 hours.  Glucose, capillary     Status: Abnormal   Collection Time: 02/14/23 11:21 AM  Result Value Ref Range   Glucose-Capillary 131 (H) 70 - 99 mg/dL    Comment: Glucose reference range applies only to samples taken after fasting for at least 8 hours.   Comment 1 Notify RN     Blood Alcohol level:  Lab Results  Component Value Date   ETH <10 08/14/2022   ETH <10 07/30/2020    Metabolic Disorder Labs: Lab Results  Component Value Date   HGBA1C 8.4 (H) 02/07/2023   MPG 194.38 02/07/2023   MPG 269 08/27/2022   Lab Results  Component Value Date   PROLACTIN 10.6  08/31/2015   Lab Results  Component Value Date   CHOL 196 02/14/2023   TRIG 77 02/14/2023   HDL 47 02/14/2023   CHOLHDL 4.2 02/14/2023   VLDL 15  02/14/2023   LDLCALC 134 (H) 02/14/2023   LDLCALC 79 08/27/2022    Physical Findings: AIMS:  , ,  ,  ,    CIWA:    COWS:     Musculoskeletal: Strength & Muscle Tone: decreased Gait & Station: normal Patient leans: N/A  Psychiatric Specialty Exam: Physical Exam Vitals and nursing note reviewed.  Constitutional:      Appearance: Normal appearance.  HENT:     Head: Normocephalic.     Nose: Nose normal.  Pulmonary:     Effort: Pulmonary effort is normal.  Musculoskeletal:        General: Normal range of motion.     Cervical back: Normal range of motion.  Neurological:     General: No focal deficit present.     Mental Status: She is alert and oriented to person, place, and time.     Review of Systems  Psychiatric/Behavioral:  Positive for depression. The patient is nervous/anxious.   All other systems reviewed and are negative.   Blood pressure 117/61, pulse 85, temperature (!) 97.2 F (36.2 C), resp. rate 18, height 5\' 5"  (1.651 m), weight 73 kg, SpO2 96%.Body mass index is 26.79 kg/m.  General Appearance: Casual  Eye Contact:  Fair  Speech:  Clear and Coherent  Volume:  Normal  Mood:  Anxious and Depressed  Affect:  Congruent  Thought Process:  Coherent  Orientation:  Full (Time, Place, and Person)  Thought Content:  WDL and Logical  Suicidal Thoughts:  No  Homicidal Thoughts:  No  Memory:  Immediate;   Fair Recent;   Fair Remote;   Fair  Judgement:  Fair  Insight:  Good  Psychomotor Activity:  Decreased  Concentration:  Concentration: Good and Attention Span: Fair  Recall:  Fair  Fund of Knowledge:  Good  Language:  Good  Akathisia:  No  Handed:  Right  AIMS (if indicated):     Assets:  Housing Leisure Time Resilience Social Support  ADL's:  Intact  Cognition:  WNL  Sleep:          Physical Exam: Physical Exam Vitals and nursing note reviewed.  Constitutional:      Appearance: Normal appearance.  HENT:     Head: Normocephalic.     Nose:  Nose normal.  Pulmonary:     Effort: Pulmonary effort is normal.  Musculoskeletal:        General: Normal range of motion.     Cervical back: Normal range of motion.  Neurological:     General: No focal deficit present.     Mental Status: She is alert and oriented to person, place, and time.    Review of Systems  Psychiatric/Behavioral:  Positive for depression. The patient is nervous/anxious.   All other systems reviewed and are negative.  Blood pressure 117/61, pulse 85, temperature (!) 97.2 F (36.2 C), resp. rate 18, height 5\' 5"  (1.651 m), weight 73 kg, SpO2 96%. Body mass index is 26.79 kg/m.   Treatment Plan Summary: Daily contact with patient to assess and evaluate symptoms and progress in treatment, Medication management, and Plan : Bipolar 1 disorder -Latuda 20mg  oral daily with supper   Nicotine cessation -nicorette 2mg  oral as needed smoking cessation   Agitation PRNs -Haldol 5mg  PO or IM 3 times daily  PRN agitation -Ativan 2mg  PO or IM 3 times daily PRN agitation   Anxiety -Hydroxyzine 25mg  oral 3 times daily PRN anxiety  Insomnia: Restarted her Ambien 5 mg daily at bedtime  Nanine Means, NP 02/14/2023, 2:39 PM

## 2023-02-14 NOTE — Assessment & Plan Note (Signed)
Improved.  Her psych medication changed, risperidone discontinued, and patient was started on Latuda.  Plan to check QTc level tomorrow again, if QTc level remains stable can consider check EKG sporadically, otherwise recommend daily EKG until QTc stabilized.

## 2023-02-14 NOTE — Assessment & Plan Note (Signed)
-  Stable, continue LABA, ICS and LAMA

## 2023-02-14 NOTE — Plan of Care (Signed)
  Problem: Education: Goal: Knowledge of General Education information will improve Description Including pain rating scale, medication(s)/side effects and non-pharmacologic comfort measures Outcome: Progressing   Problem: Health Behavior/Discharge Planning: Goal: Ability to manage health-related needs will improve Outcome: Progressing   

## 2023-02-14 NOTE — Progress Notes (Signed)
Consultation Progress Note   Patient: [name] [name] EAV:[id number] DOB: [birth year] DOA: 02/12/2023 DOS: the patient was seen and examined on 02/14/2023 Primary service: Lewanda Rife, MD  Brief hospital course: Taken from consult note.   [name] [name] is a 52 y.o. female with past medical history of chronic HFrEF with LVEF 20-25%, COPD, IDDM, was admitted to psychiatry unit for management of depression.  Patient was recently hospitalized for CHF decompensation secondary to noncompliant with CHF medications.  Her CHF medication were adjusted during hospitalization, due to persistent orthostatic hypotension most of her blood pressure medication discontinued.  She was started on midodrine and Bumex 1 mg twice daily (cut down from 2 mg twice daily and discharged to psychiatry unit.  Medical team was asked to evaluate the patient for persistent borderline  hypotension, and slowly increase of creatinine reading on BMP as well as prolonged QTc interval.  Today, psychiatry unit nurse reported that patient blood pressure SBP in the lower 80s and patient reports feeling lethargic.  Patient denies any shortness of breath chest pain lightheadedness at this point.   She was appeared volume contracted, diuretic was held yesterday.  9/6: Vital stable with blood pressure of 119/75.  Creatinine improved to 0.64 from 1.18. Lipid panel with LDL of 134, goal is less than 70.  Patient recently started on Crestor. Clinically appears dry, decreasing the dose of Bumex to daily as she continued to have some dizziness.  Assessment and Plan: * Bipolar I disorder, most recent episode depressed (HCC) -Being managed by psychiatry  Chronic systolic CHF (congestive heart failure) (HCC) Patient clinically appears dry with significant improvement in BNP to 446, it was 2678 8 days ago. -Decreasing the dose of Bumex to daily instead of twice daily. -Continue with daily weight  Hypotension Blood pressure currently within  goal. -Continue with midodrine  Prolonged QT interval Improved.  Her psych medication changed, risperidone discontinued, and patient was started on Latuda.  Plan to check QTc level tomorrow again, if QTc level remains stable can consider check EKG sporadically, otherwise recommend daily EKG until QTc stabilized.   DM2 (diabetes mellitus, type 2) (HCC) CBG within goal. -Continue Semglee and SSI  COPD (chronic obstructive pulmonary disease) (HCC) -Stable, continue LABA, ICS and LAMA     TRH will continue to follow the patient.  Subjective: Patient continued to feel little dizzy with ambulation.  No chest pain or shortness of breath  Physical Exam: Vitals:   02/14/23 1036 02/14/23 1039 02/14/23 1040 02/14/23 1100  BP: 103/66 115/63 117/61   Pulse: 73 79 85   Resp:      Temp:      TempSrc:      SpO2: 96% 97% 96%   Weight:    73 kg  Height:       General.  Well-developed lady, in no acute distress. Pulmonary.  Lungs clear bilaterally, normal respiratory effort. CV.  Regular rate and rhythm, no JVD, rub or murmur. Abdomen.  Soft, nontender, nondistended, BS positive. CNS.  Alert and oriented .  No focal neurologic deficit. Extremities.  No edema, no cyanosis, pulses intact and symmetrical. Psychiatry.  Judgment and insight appears normal.   Data Reviewed: Prior data reviewed  Family Communication: Discussed with patient  Time spent: 45 minutes.  This record has been created using Conservation officer, historic buildings. Errors have been sought and corrected,but may not always be located. Such creation errors do not reflect on the standard of care.   Author: Arnetha Courser, MD 02/14/2023  1:10 PM  For on call review [email or web address].

## 2023-02-14 NOTE — Assessment & Plan Note (Signed)
Patient clinically appears dry with significant improvement in BNP to 446, it was 2678 8 days ago. -Decreasing the dose of Bumex to daily instead of twice daily. -Continue with daily weight

## 2023-02-14 NOTE — Group Note (Signed)
Date:  02/14/2023 Time:  9:33 PM  Group Topic/Focus:  Wrap-Up Group:   The focus of this group is to help patients review their daily goal of treatment and discuss progress on daily workbooks.    Participation Level:  Minimal  Participation Quality:  Appropriate and Resistant  Affect:  Appropriate, Flat, and Tearful  Cognitive:  Alert, Appropriate, and Oriented  Insight: Appropriate  Engagement in Group:  Limited  Modes of Intervention:  Discussion and Education  Additional Comments:  After group, patient and tech had conversation while helping pt fill out her inventory sheet. Pt expressed concerns and questions regarding her medication. Pt also opened up about her son committing SI and how close they were. Pt was tearful but congruent in thoughts. Pt seems hopeful to working toward a plan to be discharged.   Maglione,Ignacio Lowder E 02/14/2023, 9:33 PM

## 2023-02-14 NOTE — BH IP Treatment Plan (Signed)
Interdisciplinary Treatment and Diagnostic Plan Update  02/14/2023 Time of Session: 09:29 AMIJA [name] MRN: [id number]  Principal Diagnosis: Bipolar I disorder, most recent episode depressed (HCC)  Secondary Diagnoses: Active Problems:   DM2 (diabetes mellitus, type 2) (HCC)   Chronic systolic CHF (congestive heart failure) (HCC)   COPD (chronic obstructive pulmonary disease) (HCC)   Prolonged QT interval   Hypotension   Current Medications:  Current Facility-Administered Medications  Medication Dose Route Frequency Provider Last Rate Last Admin   acetaminophen (TYLENOL) tablet 650 mg  650 mg Oral Q6H PRN Maryagnes Amos, FNP   650 mg at 02/13/23 2137   alum & mag hydroxide-simeth (MAALOX/MYLANTA) 200-200-20 MG/5ML suspension 30 mL  30 mL Oral Q4H PRN Maryagnes Amos, FNP       [START ON 02/15/2023] bumetanide (BUMEX) tablet 1 mg  1 mg Oral Daily Arnetha Courser, MD       cholecalciferol (VITAMIN D3) 25 MCG (1000 UNIT) tablet 1,000 Units  1,000 Units Oral Daily Maryagnes Amos, FNP   1,000 Units at 02/14/23 0802   docusate sodium (COLACE) capsule 100 mg  100 mg Oral BID Maryagnes Amos, FNP   100 mg at 02/14/23 0802   feeding supplement (ENSURE ENLIVE / ENSURE PLUS) liquid 237 mL  237 mL Oral BID BM Mikey College T, MD       haloperidol (HALDOL) tablet 5 mg  5 mg Oral TID PRN Maryagnes Amos, FNP       Or   haloperidol lactate (HALDOL) injection 5 mg  5 mg Intramuscular TID PRN Rosario Adie, Juel Burrow, FNP       hydrOXYzine (ATARAX) tablet 25 mg  25 mg Oral TID PRN Sindy Guadeloupe, NP       influenza vac split trivalent PF (FLULAVAL) injection 0.5 mL  0.5 mL Intramuscular Tomorrow-1000 Marval Regal, Meenakshi, MD       insulin aspart (novoLOG) injection 0-15 Units  0-15 Units Subcutaneous TID WC Maryagnes Amos, FNP   3 Units at 02/14/23 0835   insulin aspart (novoLOG) injection 0-5 Units  0-5 Units Subcutaneous QHS Maryagnes Amos, FNP   2 Units at  02/13/23 2129   insulin glargine-yfgn (SEMGLEE) injection 15 Units  15 Units Subcutaneous Daily Mikey College T, MD   15 Units at 02/14/23 0800   ipratropium-albuterol (DUONEB) 0.5-2.5 (3) MG/3ML nebulizer solution 3 mL  3 mL Nebulization Q6H PRN Starkes-Perry, Juel Burrow, FNP       lidocaine (XYLOCAINE) 5 % ointment   Topical QID PRN Starkes-Perry, Juel Burrow, FNP       LORazepam (ATIVAN) tablet 2 mg  2 mg Oral TID PRN Maryagnes Amos, FNP       Or   LORazepam (ATIVAN) injection 2 mg  2 mg Intramuscular TID PRN Maryagnes Amos, FNP       lurasidone (LATUDA) tablet 20 mg  20 mg Oral Q supper Lauree Chandler, NP   20 mg at 02/14/23 1640   magnesium hydroxide (MILK OF MAGNESIA) suspension 30 mL  30 mL Oral Daily PRN Maryagnes Amos, FNP       midodrine (PROAMATINE) tablet 5 mg  5 mg Oral TID WC Starkes-Perry, Juel Burrow, FNP   5 mg at 02/14/23 1640   mometasone-formoterol (DULERA) 200-5 MCG/ACT inhaler 2 puff  2 puff Inhalation BID Maryagnes Amos, FNP   2 puff at 02/14/23 0759   nicotine polacrilex (NICORETTE) gum 2 mg  2 mg Oral PRN Maryagnes Amos,  FNP       oxyCODONE (Oxy IR/ROXICODONE) immediate release tablet 10 mg  10 mg Oral Q6H PRN Starkes-Perry, Juel Burrow, FNP       pneumococcal 20-valent conjugate vaccine (PREVNAR 20) injection 0.5 mL  0.5 mL Intramuscular Tomorrow-1000 Lewanda Rife, MD       polyethylene glycol (MIRALAX / GLYCOLAX) packet 17 g  17 g Oral Daily Starkes-Perry, Juel Burrow, FNP       rosuvastatin (CRESTOR) tablet 20 mg  20 mg Oral Daily Maryagnes Amos, FNP   20 mg at 02/14/23 0800   senna (SENOKOT) tablet 8.6 mg  1 tablet Oral BID Cinderella, Margaret A   8.6 mg at 02/14/23 0800   zolpidem (AMBIEN) tablet 5 mg  5 mg Oral QHS Charm Rings, NP       PTA Medications: Medications Prior to Admission  Medication Sig Dispense Refill Last Dose   acetaminophen (TYLENOL) 325 MG tablet Take 1 tablet (325 mg total) by mouth every 6 (six) hours  as needed for headache or mild pain. (Patient not taking: Reported on 02/06/2023) 60 tablet 0    albuterol (VENTOLIN HFA) 108 (90 Base) MCG/ACT inhaler Inhale 2 puffs into the lungs every 6 (six) hours as needed for wheezing or shortness of breath. 18 g 2    blood glucose meter kit and supplies Dispense based on patient and insurance preference. Use up to four times daily as directed. (FOR ICD-10 E10.9, E11.9). 1 each 0    bumetanide (BUMEX) 1 MG tablet Take 1 tablet (1 mg total) by mouth 2 (two) times daily. 30 tablet 1    vitamin D3 (CHOLECALCIFEROL) 25 MCG tablet Take 1 tablet (1,000 Units total) by mouth daily. 90 tablet 1    glucose blood (ONETOUCH VERIO) test strip Use three times daily as directed to check blood sugar 100 each 0    insulin glargine, 1 Unit Dial, (TOUJEO SOLOSTAR) 300 UNIT/ML Solostar Pen Inject 28 Units into the skin at bedtime. 3 mL 11    Insulin Pen Needle 32G X 4 MM MISC Use As Directed 100 each 0    Insulin Pen Needle 32G X 4 MM MISC Use 3 (three) times daily. 100 each 0    ipratropium-albuterol (DUONEB) 0.5-2.5 (3) MG/3ML SOLN inhale 3 mLs by nebulization every 6 (six) hours as needed. 360 mL 0    Lancet Devices (ONETOUCH DELICA PLUS LANCING) MISC Use 3 (three) times daily. 1 each 0    midodrine (PROAMATINE) 5 MG tablet Take 1 tablet (5 mg total) by mouth 3 (three) times daily with meals. 90 tablet 1    mometasone-formoterol (DULERA) 200-5 MCG/ACT AERO Inhale 2 puffs into the lungs 2 (two) times daily. 13 g 3    nicotine polacrilex (NICORETTE) 2 MG gum Take 1 each (2 mg total) by mouth as needed for smoking cessation. 100 tablet 0    nitroGLYCERIN (NITROSTAT) 0.4 MG SL tablet Place 1 tablet (0.4 mg total) under the tongue every 5 (five) minutes as needed for chest pain. 50 tablet 0    OneTouch Delica Lancets 33G MISC Use 3 (three) times daily as directed to check blood sugar 100 each 0    oxyCODONE-acetaminophen (PERCOCET) 10-325 MG tablet Take 1 tablet by mouth every 6  (six) hours as needed for pain. 15 tablet 0    potassium chloride (KLOR-CON M) 10 MEQ tablet Take 1 tablet (10 mEq total) by mouth daily. (Patient not taking: Reported on 02/06/2023) 30 tablet 8  risperiDONE (RISPERDAL) 2 MG tablet Take 1/2 tablet in the morning and one tablet in the evening. 45 tablet 3    rosuvastatin (CRESTOR) 20 MG tablet Take 1 tablet (20 mg total) by mouth daily. 30 tablet 1     Patient Stressors:    Patient Strengths:    Treatment Modalities: Medication Management, Group therapy, Case management,  1 to 1 session with clinician, Psychoeducation, Recreational therapy.   Physician Treatment Plan for Primary Diagnosis: Bipolar I disorder, most recent episode depressed (HCC) Long Term Goal(s): Improvement in symptoms so as ready for discharge   Short Term Goals: Ability to identify changes in lifestyle to reduce recurrence of condition will improve Ability to verbalize feelings will improve Ability to disclose and discuss suicidal ideas Ability to demonstrate self-control will improve Ability to identify and develop effective coping behaviors will improve Ability to identify triggers associated with substance abuse/mental health issues will improve  Medication Management: Evaluate patient's response, side effects, and tolerance of medication regimen.  Therapeutic Interventions: 1 to 1 sessions, Unit Group sessions and Medication administration.  Evaluation of Outcomes: Not Met  Physician Treatment Plan for Secondary Diagnosis: Active Problems:   DM2 (diabetes mellitus, type 2) (HCC)   Chronic systolic CHF (congestive heart failure) (HCC)   COPD (chronic obstructive pulmonary disease) (HCC)   Prolonged QT interval   Hypotension  Long Term Goal(s): Improvement in symptoms so as ready for discharge   Short Term Goals: Ability to identify changes in lifestyle to reduce recurrence of condition will improve Ability to verbalize feelings will improve Ability to  disclose and discuss suicidal ideas Ability to demonstrate self-control will improve Ability to identify and develop effective coping behaviors will improve Ability to identify triggers associated with substance abuse/mental health issues will improve     Medication Management: Evaluate patient's response, side effects, and tolerance of medication regimen.  Therapeutic Interventions: 1 to 1 sessions, Unit Group sessions and Medication administration.  Evaluation of Outcomes: Not Met   RN Treatment Plan for Primary Diagnosis: Bipolar I disorder, most recent episode depressed (HCC) Long Term Goal(s): Knowledge of disease and therapeutic regimen to maintain health will improve  Short Term Goals: Ability to remain free from injury will improve, Ability to verbalize frustration and anger appropriately will improve, Ability to demonstrate self-control, Ability to participate in decision making will improve, Ability to verbalize feelings will improve, Ability to disclose and discuss suicidal ideas, Ability to identify and develop effective coping behaviors will improve, and Compliance with prescribed medications will improve  Medication Management: RN will administer medications as ordered by provider, will assess and evaluate patient's response and provide education to patient for prescribed medication. RN will report any adverse and/or side effects to prescribing provider.  Therapeutic Interventions: 1 on 1 counseling sessions, Psychoeducation, Medication administration, Evaluate responses to treatment, Monitor vital signs and CBGs as ordered, Perform/monitor CIWA, COWS, AIMS and Fall Risk screenings as ordered, Perform wound care treatments as ordered.  Evaluation of Outcomes: Not Met   LCSW Treatment Plan for Primary Diagnosis: Bipolar I disorder, most recent episode depressed (HCC) Long Term Goal(s): Safe transition to appropriate next level of care at discharge, Engage patient in therapeutic  group addressing interpersonal concerns.  Short Term Goals: Engage patient in aftercare planning with referrals and resources, Increase social support, Increase ability to appropriately verbalize feelings, Increase emotional regulation, Facilitate acceptance of mental health diagnosis and concerns, and Increase skills for wellness and recovery  Therapeutic Interventions: Assess for all discharge needs, 1 to  1 time with Child psychotherapist, Explore available resources and support systems, Assess for adequacy in community support network, Educate family and significant other(s) on suicide prevention, Complete Psychosocial Assessment, Interpersonal group therapy.  Evaluation of Outcomes: Not Met   Progress in Treatment: Attending groups: No. Participating in groups: No. Taking medication as prescribed: Yes. Toleration medication: Yes. Family/Significant other contact made: No, will contact:  if given permission as pt has declined familial/collateral contacts. Patient understands diagnosis: Yes. Discussing patient identified problems/goals with staff: Yes. Medical problems stabilized or resolved: Yes. Denies suicidal/homicidal ideation: Yes. Issues/concerns per patient self-inventory: No. Other: none.  New problem(s) identified: No, Describe:  none identified.  New Short Term/Long Term Goal(s): elimination of symptoms of psychosis, medication management for mood stabilization; elimination of SI thoughts; development of comprehensive mental wellness plan.   Patient Goals:  "Just get on my meds and get more balanced."  Discharge Plan or Barriers: CSW will assist pt with development of an appropriate aftercare/discharge plan.   Reason for Continuation of Hospitalization: Depression Mania Medication stabilization Suicidal ideation  Estimated Length of Stay: 1-7 days  Last 3 [place] Suicide Severity Risk Score: Flowsheet Row Admission (Current) from 02/12/2023 in [facility] INPATIENT BEHAVIORAL  MEDICINE ED to Hosp-Admission (Discharged) from 02/06/2023 in [facility] ED from 12/14/2022 in [facility] Emergency Department at [facility]  C-SSRS RISK CATEGORY No Risk No Risk No Risk       Last PHQ 2/9 Scores:    09/30/2022   11:54 AM 07/30/2020    6:34 AM 06/07/2020   12:28 PM  Depression screen PHQ 2/9  Decreased Interest 3 0 0  Down, Depressed, Hopeless 2 1 0  PHQ - 2 Score 5 1 0  Altered sleeping 3 0   Tired, decreased energy 3 3   Change in appetite 1 0   Feeling bad or failure about yourself  1 0   Trouble concentrating 3 3   Moving slowly or fidgety/restless 3 1   Suicidal thoughts 0 0   PHQ-9 Score 19 8   Difficult doing work/chores Very difficult Somewhat difficult     Scribe for Treatment Team: Glenis Smoker, LCSW 02/14/2023 4:41 PM

## 2023-02-15 ENCOUNTER — Other Ambulatory Visit (HOSPITAL_COMMUNITY): Payer: Self-pay

## 2023-02-15 DIAGNOSIS — F3130 Bipolar disorder, current episode depressed, mild or moderate severity, unspecified: Secondary | ICD-10-CM | POA: Diagnosis not present

## 2023-02-15 LAB — GLUCOSE, CAPILLARY
Glucose-Capillary: 126 mg/dL — ABNORMAL HIGH (ref 70–99)
Glucose-Capillary: 155 mg/dL — ABNORMAL HIGH (ref 70–99)
Glucose-Capillary: 193 mg/dL — ABNORMAL HIGH (ref 70–99)
Glucose-Capillary: 178 mg/dL — ABNORMAL HIGH (ref 70–99)

## 2023-02-15 NOTE — Progress Notes (Signed)
I assumed care for [name] at about 20:00.  She was resting in the day area watching TV with peers, in no apparent distress  She continues to report ongoing generalized pain, she is aware that she can not receive Oxycodone until after midnight as last dose was given at about 18:15.  Vital signs (with orthostatics) as documented, no falls this far, she denied any avh/hi/si at the time of my assessment. She received prn hydroxyzine for anxiety,effective on follow up. Abg as documented, no signs of hyper/hypo glycemia. She is being monitored as ordered.
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
## 2023-02-15 NOTE — Group Note (Signed)
LCSW Group Therapy Note  Group Date: 02/15/2023 Start Time: 1305 End Time: 1345   Type of Therapy and Topic:  Group Therapy - Healthy vs Unhealthy Coping Skills  Participation Level:  Did Not Attend   Description of Group The focus of this group was to determine what unhealthy coping techniques typically are used by group members and what healthy coping techniques would be helpful in coping with various problems. Patients were guided in becoming aware of the differences between healthy and unhealthy coping techniques. Patients were asked to identify 2-3 healthy coping skills they would like to learn to use more effectively.  Therapeutic Goals Patients learned that coping is what human beings do all day long to deal with various situations in their lives Patients defined and discussed healthy vs unhealthy coping techniques Patients identified their preferred coping techniques and identified whether these were healthy or unhealthy Patients determined 2-3 healthy coping skills they would like to become more familiar with and use more often. Patients provided support and ideas to each other   Summary of Patient Progress:  The patient did not attend.     Marshell Levan, LCSWA 02/15/2023  3:15 PM

## 2023-02-15 NOTE — Progress Notes (Signed)
Note started in error.

## 2023-02-15 NOTE — Progress Notes (Signed)
Nursing Shift Note:  1900-0700  Attended Evening Group: yes Medication Compliant:  yes Behavior: needy, depressed, anxious, cooperative Sleep Quality: fair Significant Changes: none   02/12/23 2034  Psych Admission Type (Psych Patients Only)  Admission Status Involuntary  Psychosocial Assessment  Patient Complaints Depression  Eye Contact Fair  Facial Expression Flat  Affect Depressed  Speech Logical/coherent  Interaction Assertive;Needy  Motor Activity Unsteady;Slow  Appearance/Hygiene Unremarkable  Behavior Characteristics Cooperative;Appropriate to situation  Mood Depressed  Aggressive Behavior  Effect No apparent injury  Thought Process  Coherency WDL  Content WDL  Delusions None reported or observed  Perception WDL  Hallucination None reported or observed  Judgment Impaired  Confusion None  Danger to Self  Current suicidal ideation? Denies  Danger to Others  Danger to Others None reported or observed

## 2023-02-15 NOTE — Plan of Care (Signed)
  Problem: Education: Goal: Knowledge of General Education information will improve Description Including pain rating scale, medication(s)/side effects and non-pharmacologic comfort measures Outcome: Progressing   Problem: Health Behavior/Discharge Planning: Goal: Ability to manage health-related needs will improve Outcome: Progressing   

## 2023-02-15 NOTE — Assessment & Plan Note (Signed)
Patient clinically appears dry with significant improvement in BNP to 446, it was 2678 8 days ago. -Decreasing the dose of Bumex to daily instead of twice daily. -Continue with daily dose of Bumex for now -Continue with daily weight

## 2023-02-15 NOTE — Plan of Care (Signed)
  Problem: Education: Goal: Knowledge of General Education information will improve Description: Including pain rating scale, medication(s)/side effects and non-pharmacologic comfort measures 02/15/2023 1906 by Luane School, RN Outcome: Progressing 02/15/2023 1906 by Luane School, RN Outcome: Progressing   Problem: Health Behavior/Discharge Planning: Goal: Ability to manage health-related needs will improve 02/15/2023 1906 by Luane School, RN Outcome: Progressing 02/15/2023 1906 by Luane School, RN Outcome: Progressing

## 2023-02-15 NOTE — Progress Notes (Signed)
Consultation Progress Note   Patient: [name] [name] ZOX:[id number] DOB: April 21, 1971 DOA: 02/12/2023 DOS: the patient was seen and examined on 02/15/2023 Primary service: Lewanda Rife, MD  Brief hospital course: Taken from consult note.   [name] [name] is a 52 y.o. female with past medical history of chronic HFrEF with LVEF 20-25%, COPD, IDDM, was admitted to psychiatry unit for management of depression.  Patient was recently hospitalized for CHF decompensation secondary to noncompliant with CHF medications.  Her CHF medication were adjusted during hospitalization, due to persistent orthostatic hypotension most of her blood pressure medication discontinued.  She was started on midodrine and Bumex 1 mg twice daily (cut down from 2 mg twice daily and discharged to psychiatry unit.  Medical team was asked to evaluate the patient for persistent borderline  hypotension, and slowly increase of creatinine reading on BMP as well as prolonged QTc interval.  Today, psychiatry unit nurse reported that patient blood pressure SBP in the lower 80s and patient reports feeling lethargic.  Patient denies any shortness of breath chest pain lightheadedness at this point.   She was appeared volume contracted, diuretic was held yesterday.  9/6: Vital stable with blood pressure of 119/75.  Creatinine improved to 0.64 from 1.18. Lipid panel with LDL of 134, goal is less than 70.  Patient recently started on Crestor. Clinically appears dry, decreasing the dose of Bumex to daily as she continued to have some dizziness.  9/7: Vital stable, patient with improved dizziness after decreasing the dose of Bumex to once daily which we will continue at this time.  Assessment and Plan: * Bipolar I disorder, most recent episode depressed (HCC)-resolved as of 02/14/2023 -Being managed by psychiatry  Chronic systolic CHF (congestive heart failure) (HCC) Patient clinically appears dry with significant improvement in BNP to 446, it  was 2678 8 days ago. -Decreasing the dose of Bumex to daily instead of twice daily. -Continue with daily dose of Bumex for now -Continue with daily weight  Hypotension Blood pressure currently within goal. -Continue with midodrine  Prolonged QT interval Improved.  Her psych medication changed, risperidone discontinued, and patient was started on Latuda.  Plan to check QTc level tomorrow again, if QTc level remains stable can consider check EKG sporadically, otherwise recommend daily EKG until QTc stabilized.   DM2 (diabetes mellitus, type 2) (HCC) CBG within goal. -Continue Semglee and SSI  COPD (chronic obstructive pulmonary disease) (HCC) -Stable, continue LABA, ICS and LAMA     TRH will continue to follow the patient.  Subjective: Patient was feeling much improved and does not have dizziness today.  Physical Exam: Vitals:   02/14/23 2109 02/14/23 2110 02/15/23 0500 02/15/23 0613  BP: 112/68 123/81  114/70  Pulse: 78 88  79  Resp: 20 (!) 22  18  Temp: (!) 97.2 F (36.2 C) (!) 97.2 F (36.2 C)  (!) 97.2 F (36.2 C)  TempSrc:      SpO2: 100% 99%  100%  Weight:   72.1 kg   Height:       General.  Well-developed lady, in no acute distress. Pulmonary.  Lungs clear bilaterally, normal respiratory effort. CV.  Regular rate and rhythm, no JVD, rub or murmur. Abdomen.  Soft, nontender, nondistended, BS positive. CNS.  Alert and oriented .  No focal neurologic deficit. Extremities.  No edema, no cyanosis, pulses intact and symmetrical. Psychiatry.  Judgment and insight appears normal.   Data Reviewed: Prior data reviewed  Family Communication: Discussed with patient  Time  spent: 40 minutes.  This record has been created using Conservation officer, historic buildings. Errors have been sought and corrected,but may not always be located. Such creation errors do not reflect on the standard of care.   Author: Arnetha Courser, MD 02/15/2023 2:23 PM  For on call review  [email or web address].

## 2023-02-15 NOTE — Group Note (Signed)
Date:  02/15/2023 Time:  10:59 PM  Group Topic/Focus:  Healthy Communication:   The focus of this group is to discuss communication, barriers to communication, as well as healthy ways to communicate with others. Managing Feelings:   The focus of this group is to identify what feelings patients have difficulty handling and develop a plan to handle them in a healthier way upon discharge. Wrap-Up Group:   The focus of this group is to help patients review their daily goal of treatment and discuss progress on daily workbooks.    Participation Level:  Active  Participation Quality:  Appropriate and Attentive  Affect:  Appropriate and Flat  Cognitive:  Alert and Appropriate  Insight: Appropriate and Good  Engagement in Group:  Engaged and Improving  Modes of Intervention:  Discussion and Education  Additional Comments:  Pt quiet in group conversation but would shake head in agreeable stance during the conversation   Maglione,Elektra Wartman E 02/15/2023, 10:59 PM

## 2023-02-15 NOTE — TOC Progression Note (Signed)
Discharge medications (10) are being stored in the Transitions of Care [facility]) [facility] on the second floor until patient is ready for discharge.   ONE [facility]

## 2023-02-15 NOTE — Progress Notes (Signed)
[facility] MD Progress Note  02/15/2023 2:37 PM [name] [name]  MRN:  [id number]  Subjective:  Notes, vital signs, and labs reviewed prior to assessment.  52 yo female admitted for depression with suicidal ideations.  On evaluation, she was resting quietly in her room.  Her sleep was still poor last night even with her Ambien that she takes at home.  Denies any shortness of breath or breathing difficulties or edema.  Appetite continues to be decreased but she did drink her Ensure drink at lunch.  Mild depression with no suicidal ideations, anxiety is "really bad" at 8/10 with no panic attacks.  EKGs ordered daily per the consultation note, encouraged nursing to complete.  Considering her slightly prolonged QT interval, most likely related to her CHF, and her CHF, coping skills for anxiety encouraged--mindfulness practices..  Principal Problem: Bipolar I disorder, most recent episode depressed (HCC) Diagnosis: Active Problems:   DM2 (diabetes mellitus, type 2) (HCC)   Chronic systolic CHF (congestive heart failure) (HCC)   COPD (chronic obstructive pulmonary disease) (HCC)   Prolonged QT interval   Hypotension  Total Time spent with patient: 30 minutes  Past Psychiatric History: bipolar d/o  Past Medical History:  Past Medical History:  Diagnosis Date   Anxiety disorder    Asthma    Bipolar affective disorder (HCC)    Blood transfusion without reported diagnosis    CHF (congestive heart failure) (HCC)    COPD (chronic obstructive pulmonary disease) (HCC)    Depression    Diabetes mellitus    Migraine     Past Surgical History:  Procedure Laterality Date   KNEE ARTHROSCOPY     x 2   KNEE SURGERY Left    Incision made and knee cleaned out   TUBAL LIGATION     Family History:  Family History  Problem Relation Age of Onset   Bipolar disorder Mother    Hypertension Father    Diabetes Father    Family Psychiatric  History: see above Social History:  Social History   Substance and  Sexual Activity  Alcohol Use Not Currently   Comment: occasionally     Social History   Substance and Sexual Activity  Drug Use No    Social History   Socioeconomic History   Marital status: Divorced    Spouse name: Not on file   Number of children: Not on file   Years of education: Not on file   Highest education level: Not on file  Occupational History   Not on file  Tobacco Use   Smoking status: Every Day    Current packs/day: 1.00    Average packs/day: 1 pack/day for 29.0 years (29.0 ttl pk-yrs)    Types: Cigarettes   Smokeless tobacco: Never  Vaping Use   Vaping status: Former  Substance and Sexual Activity   Alcohol use: Not Currently    Comment: occasionally   Drug use: No   Sexual activity: Yes    Birth control/protection: Surgical  Other Topics Concern   Not on file  Social History Narrative   Patient lives in [place] with her 2 kids- 61 in 79 years old.   She has total 4 kids.   She is a single mom.         Social Determinants of Health   Financial Resource Strain: High Risk (09/30/2022)   Overall Financial Resource Strain (CARDIA)    Difficulty of Paying Living Expenses: Hard  Food Insecurity: No Food Insecurity (02/12/2023)  Hunger Vital Sign    Worried About Running Out of Food in the Last Year: Never true    Ran Out of Food in the Last Year: Never true  Recent Concern: Food Insecurity - Food Insecurity Present (02/07/2023)   Hunger Vital Sign    Worried About Running Out of Food in the Last Year: Sometimes true    Ran Out of Food in the Last Year: Sometimes true  Transportation Needs: No Transportation Needs (02/12/2023)   PRAPARE - Administrator, Civil Service (Medical): No    Lack of Transportation (Non-Medical): No  Recent Concern: Transportation Needs - Unmet Transportation Needs (02/06/2023)   PRAPARE - Transportation    Lack of Transportation (Medical): Yes    Lack of Transportation (Non-Medical): Yes  Physical Activity: Not  on file  Stress: No Stress Concern Present (01/07/2022)   Received from [facility], [facility]   [employer] [facility] - Occupational Stress Questionnaire    Feeling of Stress : Not at all  Social Connections: Unknown (10/19/2021)   Received from [facility], [facility]   Social Network    Social Network: Not on file   Additional Social History:      Sleep: Poor  Appetite:  Fair to poor  Current Medications: Current Facility-Administered Medications  Medication Dose Route Frequency Provider Last Rate Last Admin   acetaminophen (TYLENOL) tablet 650 mg  650 mg Oral Q6H PRN Maryagnes Amos, FNP   650 mg at 02/13/23 2137   alum & mag hydroxide-simeth (MAALOX/MYLANTA) 200-200-20 MG/5ML suspension 30 mL  30 mL Oral Q4H PRN Starkes-Perry, Juel Burrow, FNP       bumetanide (BUMEX) tablet 1 mg  1 mg Oral Daily Arnetha Courser, MD   1 mg at 02/15/23 0826   cholecalciferol (VITAMIN D3) 25 MCG (1000 UNIT) tablet 1,000 Units  1,000 Units Oral Daily Maryagnes Amos, FNP   1,000 Units at 02/15/23 0830   docusate sodium (COLACE) capsule 100 mg  100 mg Oral BID Maryagnes Amos, FNP   100 mg at 02/15/23 0830   feeding supplement (ENSURE ENLIVE / ENSURE PLUS) liquid 237 mL  237 mL Oral BID BM Mikey College T, MD       haloperidol (HALDOL) tablet 5 mg  5 mg Oral TID PRN Maryagnes Amos, FNP       Or   haloperidol lactate (HALDOL) injection 5 mg  5 mg Intramuscular TID PRN Maryagnes Amos, FNP       hydrOXYzine (ATARAX) tablet 25 mg  25 mg Oral TID PRN Sindy Guadeloupe, NP   25 mg at 02/15/23 0836   influenza vac split trivalent PF (FLULAVAL) injection 0.5 mL  0.5 mL Intramuscular Tomorrow-1000 Lewanda Rife, MD       insulin aspart (novoLOG) injection 0-15 Units  0-15 Units Subcutaneous TID WC Maryagnes Amos, FNP   3 Units at 02/15/23 1210   insulin aspart (novoLOG) injection 0-5 Units  0-5 Units Subcutaneous QHS Maryagnes Amos, FNP    4 Units at 02/14/23 2129   insulin glargine-yfgn (SEMGLEE) injection 15 Units  15 Units Subcutaneous Daily Mikey College T, MD   15 Units at 02/15/23 0826   ipratropium-albuterol (DUONEB) 0.5-2.5 (3) MG/3ML nebulizer solution 3 mL  3 mL Nebulization Q6H PRN Starkes-Perry, Juel Burrow, FNP       lidocaine (XYLOCAINE) 5 % ointment   Topical QID PRN Starkes-Perry, Juel Burrow, FNP       LORazepam (ATIVAN)  tablet 2 mg  2 mg Oral TID PRN Maryagnes Amos, FNP       Or   LORazepam (ATIVAN) injection 2 mg  2 mg Intramuscular TID PRN Maryagnes Amos, FNP       lurasidone (LATUDA) tablet 20 mg  20 mg Oral Q supper Lauree Chandler, NP   20 mg at 02/14/23 1640   magnesium hydroxide (MILK OF MAGNESIA) suspension 30 mL  30 mL Oral Daily PRN Maryagnes Amos, FNP       midodrine (PROAMATINE) tablet 5 mg  5 mg Oral TID WC Maryagnes Amos, FNP   5 mg at 02/15/23 1239   mometasone-formoterol (DULERA) 200-5 MCG/ACT inhaler 2 puff  2 puff Inhalation BID Maryagnes Amos, FNP   2 puff at 02/15/23 0981   nicotine polacrilex (NICORETTE) gum 2 mg  2 mg Oral PRN Maryagnes Amos, FNP   2 mg at 02/14/23 2134   oxyCODONE (Oxy IR/ROXICODONE) immediate release tablet 10 mg  10 mg Oral Q6H PRN Maryagnes Amos, FNP   10 mg at 02/15/23 1239   pneumococcal 20-valent conjugate vaccine (PREVNAR 20) injection 0.5 mL  0.5 mL Intramuscular Tomorrow-1000 Lewanda Rife, MD       polyethylene glycol (MIRALAX / GLYCOLAX) packet 17 g  17 g Oral Daily Starkes-Perry, Takia S, FNP       rosuvastatin (CRESTOR) tablet 20 mg  20 mg Oral Daily Maryagnes Amos, FNP   20 mg at 02/15/23 1914   senna (SENOKOT) tablet 8.6 mg  1 tablet Oral BID Cinderella, Margaret A   8.6 mg at 02/15/23 0827   zolpidem (AMBIEN) tablet 5 mg  5 mg Oral QHS Charm Rings, NP   5 mg at 02/14/23 2121    Lab Results:  Results for orders placed or performed during the hospital encounter of 02/12/23 (from the past 48  hour(s))  Glucose, capillary     Status: Abnormal   Collection Time: 02/13/23  5:46 PM  Result Value Ref Range   Glucose-Capillary 110 (H) 70 - 99 mg/dL    Comment: Glucose reference range applies only to samples taken after fasting for at least 8 hours.  Glucose, capillary     Status: Abnormal   Collection Time: 02/13/23  9:25 PM  Result Value Ref Range   Glucose-Capillary 204 (H) 70 - 99 mg/dL    Comment: Glucose reference range applies only to samples taken after fasting for at least 8 hours.  Lipid panel     Status: Abnormal   Collection Time: 02/14/23  6:18 AM  Result Value Ref Range   Cholesterol 196 0 - 200 mg/dL   Triglycerides 77 <782 mg/dL   HDL 47 >95 mg/dL   Total CHOL/HDL Ratio 4.2 RATIO   VLDL 15 0 - 40 mg/dL   LDL Cholesterol 621 (H) 0 - 99 mg/dL    Comment:        Total Cholesterol/HDL:CHD Risk Coronary Heart Disease Risk Table                     Men   Women  1/2 Average Risk   3.4   3.3  Average Risk       5.0   4.4  2 X Average Risk   9.6   7.1  3 X Average Risk  23.4   11.0        Use the calculated Patient Ratio above and the CHD Risk Table to  determine the patient's CHD Risk.        ATP III CLASSIFICATION (LDL):  <100     mg/dL   Optimal  045-409  mg/dL   Near or Above                    Optimal  130-159  mg/dL   Borderline  811-914  mg/dL   High  >782     mg/dL   Very High Performed at [facility], [address] [place], [place] [address]   Basic metabolic panel     Status: Abnormal   Collection Time: 02/14/23  6:18 AM  Result Value Ref Range   Sodium 140 135 - 145 mmol/L   Potassium 4.1 3.5 - 5.1 mmol/L   Chloride 108 98 - 111 mmol/L   CO2 25 22 - 32 mmol/L   Glucose, Bld 135 (H) 70 - 99 mg/dL    Comment: Glucose reference range applies only to samples taken after fasting for at least 8 hours.   BUN 33 (H) 6 - 20 mg/dL   Creatinine, Ser 3.08 0.44 - 1.00 mg/dL   Calcium 8.5 (L) 8.9 - 10.3 mg/dL   GFR, Estimated >65 >78 mL/min     Comment: (NOTE) Calculated using the CKD-EPI Creatinine Equation (2021)    Anion gap 7 5 - 15    Comment: Performed at [facility], [address] [place], [place] [address]  Cortisol     Status: None   Collection Time: 02/14/23  6:18 AM  Result Value Ref Range   Cortisol, Plasma 23.2 ug/dL    Comment: (NOTE) AM    6.7 - 22.6 ug/dL PM   <95.2       ug/dL Performed at [facility], [address], [place], [place] [address]   Brain natriuretic peptide     Status: Abnormal   Collection Time: 02/14/23  6:18 AM  Result Value Ref Range   B Natriuretic Peptide 446.8 (H) 0.0 - 100.0 pg/mL    Comment: Performed at [facility], [address] [place], [place] [address]  Glucose, capillary     Status: Abnormal   Collection Time: 02/14/23  7:58 AM  Result Value Ref Range   Glucose-Capillary 162 (H) 70 - 99 mg/dL    Comment: Glucose reference range applies only to samples taken after fasting for at least 8 hours.  Glucose, capillary     Status: Abnormal   Collection Time: 02/14/23 11:21 AM  Result Value Ref Range   Glucose-Capillary 131 (H) 70 - 99 mg/dL    Comment: Glucose reference range applies only to samples taken after fasting for at least 8 hours.   Comment 1 Notify RN   Glucose, capillary     Status: Abnormal   Collection Time: 02/14/23  4:44 PM  Result Value Ref Range   Glucose-Capillary 140 (H) 70 - 99 mg/dL    Comment: Glucose reference range applies only to samples taken after fasting for at least 8 hours.  Glucose, capillary     Status: Abnormal   Collection Time: 02/14/23  9:24 PM  Result Value Ref Range   Glucose-Capillary 323 (H) 70 - 99 mg/dL    Comment: Glucose reference range applies only to samples taken after fasting for at least 8 hours.  Glucose, capillary     Status: Abnormal   Collection Time: 02/15/23  6:25 AM  Result Value Ref Range   Glucose-Capillary 126 (H) 70 - 99  mg/dL    Comment: Glucose reference range applies only  to samples taken after fasting for at least 8 hours.  Glucose, capillary     Status: Abnormal   Collection Time: 02/15/23 11:29 AM  Result Value Ref Range   Glucose-Capillary 155 (H) 70 - 99 mg/dL    Comment: Glucose reference range applies only to samples taken after fasting for at least 8 hours.    Blood Alcohol level:  Lab Results  Component Value Date   ETH <10 08/14/2022   ETH <10 07/30/2020    Metabolic Disorder Labs: Lab Results  Component Value Date   HGBA1C 8.4 (H) 02/07/2023   MPG 194.38 02/07/2023   MPG 269 08/27/2022   Lab Results  Component Value Date   PROLACTIN 10.6 08/31/2015   Lab Results  Component Value Date   CHOL 196 02/14/2023   TRIG 77 02/14/2023   HDL 47 02/14/2023   CHOLHDL 4.2 02/14/2023   VLDL 15 02/14/2023   LDLCALC 134 (H) 02/14/2023   LDLCALC 79 08/27/2022     Musculoskeletal: Strength & Muscle Tone: decreased Gait & Station: normal Patient leans: N/A  Psychiatric Specialty Exam: Physical Exam Vitals and nursing note reviewed.  Constitutional:      Appearance: Normal appearance.  HENT:     Head: Normocephalic.     Nose: Nose normal.  Pulmonary:     Effort: Pulmonary effort is normal.  Musculoskeletal:        General: Normal range of motion.     Cervical back: Normal range of motion.  Neurological:     General: No focal deficit present.     Mental Status: She is alert and oriented to person, place, and time.     Review of Systems  Psychiatric/Behavioral:  Positive for depression. The patient is nervous/anxious.   All other systems reviewed and are negative.   Blood pressure 114/70, pulse 79, temperature (!) 97.2 F (36.2 C), resp. rate 18, height 5\' 5"  (1.651 m), weight 72.1 kg, SpO2 100%.Body mass index is 26.46 kg/m.  General Appearance: Casual  Eye Contact:  Fair  Speech:  Clear and Coherent  Volume:  Normal  Mood:  Anxious and Depressed  Affect:  Congruent  Thought Process:  Coherent  Orientation:  Full (Time,  Place, and Person)  Thought Content:  WDL and Logical  Suicidal Thoughts:  No  Homicidal Thoughts:  No  Memory:  Immediate;   Fair Recent;   Fair Remote;   Fair  Judgement:  Fair  Insight:  Good  Psychomotor Activity:  Decreased  Concentration:  Concentration: Good and Attention Span: Fair  Recall:  Fair  Fund of Knowledge:  Good  Language:  Good  Akathisia:  No  Handed:  Right  AIMS (if indicated):     Assets:  Housing Leisure Time Resilience Social Support  ADL's:  Intact  Cognition:  WNL  Sleep:          Physical Exam: Physical Exam Vitals and nursing note reviewed.  Constitutional:      Appearance: Normal appearance.  HENT:     Head: Normocephalic.     Nose: Nose normal.  Pulmonary:     Effort: Pulmonary effort is normal.  Musculoskeletal:        General: Normal range of motion.     Cervical back: Normal range of motion.  Neurological:     General: No focal deficit present.     Mental Status: She is alert and oriented to person, place, and time.  Review of Systems  Psychiatric/Behavioral:  Positive for depression. The patient is nervous/anxious.   All other systems reviewed and are negative.  Blood pressure 114/70, pulse 79, temperature (!) 97.2 F (36.2 C), resp. rate 18, height 5\' 5"  (1.651 m), weight 72.1 kg, SpO2 100%. Body mass index is 26.46 kg/m.   Treatment Plan Summary: Daily contact with patient to assess and evaluate symptoms and progress in treatment, Medication management, and Plan : Bipolar 1 disorder -Latuda 20mg  oral daily with supper   Nicotine cessation -nicorette 2mg  oral as needed smoking cessation   Agitation PRNs -Haldol 5mg  PO or IM 3 times daily PRN agitation -Ativan 2mg  PO or IM 3 times daily PRN agitation   Anxiety -Hydroxyzine 25mg  oral 3 times daily PRN anxiety  Insomnia: Ambien 5 mg daily at bedtime  Nanine Means, NP 02/15/2023, 2:37 PM

## 2023-02-16 DIAGNOSIS — F3130 Bipolar disorder, current episode depressed, mild or moderate severity, unspecified: Secondary | ICD-10-CM | POA: Diagnosis not present

## 2023-02-16 LAB — GLUCOSE, CAPILLARY
Glucose-Capillary: 129 mg/dL — ABNORMAL HIGH (ref 70–99)
Glucose-Capillary: 74 mg/dL (ref 70–99)
Glucose-Capillary: 168 mg/dL — ABNORMAL HIGH (ref 70–99)
Glucose-Capillary: 169 mg/dL — ABNORMAL HIGH (ref 70–99)

## 2023-02-16 NOTE — Group Note (Signed)
Date:  02/16/2023 Time:  7:03 PM  Group Topic/Focus:  Rediscovering Joy:   The focus of this group is to explore various ways to relieve stress in a positive manner.    Participation Level:  Did Not Attend   [name] [name] 02/16/2023, 7:03 PM

## 2023-02-16 NOTE — Progress Notes (Signed)
Pt denies SI/HI/AVH and verbally agrees to approach staff if these become apparent or before harming themselves/others. Endorses depression and anxiety to this RN. Pt asked for another ensure shake, as she reports she has not been eating much while here because she does not like the food. Ensure given per patient request. Scheduled medications administered to pt, per MD orders. RN provided support and encouragement to pt. Q15 min safety checks implemented and continued. Pt is safe on the unit. Plan of care on going and no other concerns expressed at this time.

## 2023-02-16 NOTE — Group Note (Signed)
Date:  02/16/2023 Time:  12:14 PM  Group Topic/Focus:   coping with loneliness Loneliness, grief, and loss are very much related to both anxiety and depression.Group therapy activities can be very beneficial for individuals struggling with loneliness. They can provide a sense of connection, belonging, and shared interests      Participation Level:  Did Not Attend   [name] 02/16/2023, 12:14 PM

## 2023-02-16 NOTE — Progress Notes (Signed)
Consultation Progress Note   Patient: [name] [name] UJW:[id number] DOB: 14-Mar-1971 DOA: 02/12/2023 DOS: the patient was seen and examined on 02/16/2023 Primary service: Lewanda Rife, MD  Brief hospital course: Taken from consult note.   [name] [name] is a 52 y.o. female with past medical history of chronic HFrEF with LVEF 20-25%, COPD, IDDM, was admitted to psychiatry unit for management of depression.  Patient was recently hospitalized for CHF decompensation secondary to noncompliant with CHF medications.  Her CHF medication were adjusted during hospitalization, due to persistent orthostatic hypotension most of her blood pressure medication discontinued.  She was started on midodrine and Bumex 1 mg twice daily (cut down from 2 mg twice daily and discharged to psychiatry unit.  Medical team was asked to evaluate the patient for persistent borderline  hypotension, and slowly increase of creatinine reading on BMP as well as prolonged QTc interval.  Today, psychiatry unit nurse reported that patient blood pressure SBP in the lower 80s and patient reports feeling lethargic.  Patient denies any shortness of breath chest pain lightheadedness at this point.   She was appeared volume contracted, diuretic was held yesterday.  9/6: Vital stable with blood pressure of 119/75.  Creatinine improved to 0.64 from 1.18. Lipid panel with LDL of 134, goal is less than 70.  Patient recently started on Crestor. Clinically appears dry, decreasing the dose of Bumex to daily as she continued to have some dizziness.  9/7: Vital stable, patient with improved dizziness after decreasing the dose of Bumex to once daily which we will continue at this time.  9/8: Remains stable and euvolemic on current dose of Bumex.  Dizziness resolved but she mostly stays in bed stating that I am too depressed to do anything.  Assessment and Plan: * Bipolar I disorder, most recent episode depressed (HCC)-resolved as of  02/14/2023 -Being managed by psychiatry  Chronic systolic CHF (congestive heart failure) (HCC) Patient clinically appears dry with significant improvement in BNP to 446, it was 2678 8 days ago. -Decreasing the dose of Bumex to daily instead of twice daily. -Continue with daily dose of Bumex for now -Continue with daily weight  Hypotension Blood pressure currently within goal. -Continue with midodrine  Prolonged QT interval Improved.  Her psych medication changed, risperidone discontinued, and patient was started on Latuda.  Plan to check QTc level tomorrow again, if QTc level remains stable can consider check EKG sporadically, otherwise recommend daily EKG until QTc stabilized.   DM2 (diabetes mellitus, type 2) (HCC) CBG within goal. -Continue Semglee and SSI  COPD (chronic obstructive pulmonary disease) (HCC) -Stable, continue LABA, ICS and LAMA     TRH will continue to follow the patient.  Subjective: Patient was lying down comfortably when seen today.  No shortness of breath or dizziness.  Per patient she is feeling very depressed and does not want to do anything except laying down.  Physical Exam: Vitals:   02/15/23 2154 02/15/23 2156 02/16/23 0500 02/16/23 0625  BP: (!) 100/53 (!) 101/51  106/69  Pulse: 78 84  82  Resp: 18   (!) 22  Temp: 98.1 F (36.7 C)   98.1 F (36.7 C)  TempSrc: Oral     SpO2: 99% 100%  99%  Weight:   73.7 kg   Height:       General.  Well-developed lady, in no acute distress. Pulmonary.  Lungs clear bilaterally, normal respiratory effort. CV.  Regular rate and rhythm, no JVD, rub or murmur. Abdomen.  Soft,  nontender, nondistended, BS positive. CNS.  Alert and oriented .  No focal neurologic deficit. Extremities.  No edema, no cyanosis, pulses intact and symmetrical. Psychiatry.  Judgment and insight appears normal.  Appears little depressed  Data Reviewed: Prior data reviewed  Family Communication: Discussed with patient  Time spent:  39 minutes.  This record has been created using Conservation officer, historic buildings. Errors have been sought and corrected,but may not always be located. Such creation errors do not reflect on the standard of care.   Author: Arnetha Courser, MD 02/16/2023 12:03 PM  For on call review [email or web address].

## 2023-02-16 NOTE — Plan of Care (Signed)
  Problem: Clinical Measurements: Goal: Will remain free from infection Outcome: Progressing   Problem: Nutrition: Goal: Adequate nutrition will be maintained Outcome: Progressing   Problem: Elimination: Goal: Will not experience complications related to bowel motility Outcome: Progressing

## 2023-02-16 NOTE — Progress Notes (Signed)
Pt in bed most of day, declining meals, taking meal replacement shakes.  She does not like the food here.  States she is depressed. Denies SI. Pt c/o back pain and PRN medication given.  Pt encouraged to get out of bed and not isolate.    Continued monitoring for safety.     02/16/23 1100  Psych Admission Type (Psych Patients Only)  Admission Status Involuntary  Psychosocial Assessment  Patient Complaints Depression  Eye Contact Fair  Facial Expression Flat  Affect Depressed  Speech Logical/coherent  Interaction Minimal  Motor Activity Slow  Appearance/Hygiene Unremarkable  Behavior Characteristics Cooperative  Mood Depressed  Thought Process  Coherency WDL  Content WDL  Delusions None reported or observed  Perception WDL  Hallucination None reported or observed  Judgment Impaired  Confusion None  Danger to Self  Current suicidal ideation? Denies  Danger to Others  Danger to Others None reported or observed

## 2023-02-16 NOTE — Progress Notes (Signed)
[facility] MD Progress Note  02/16/2023 9:57 AM [name] [name]  MRN:  [id number]  Subjective:  52 yo female admitted for depression with suicidal ideations. Reports not sleeping well last night even with the Ambien. Denies panic attacks, suicidal ideation. Reports anxiety is "better today" and rates 5/10, no panic attacks. She reports "I'm just real depressed," no suicidal ideations.  Appetite is poor and she has been drinking Ensure as meal replacement. EKGs ordered daily per the consultation note, encouraged nursing to complete. Considering her slightly prolonged QT interval, most likely related to her CHF. Strongly encouraged her to get out of bed related CHF and back pain to develop coping skills for mood/anxiety.  Principal Problem: Bipolar I disorder, most recent episode depressed (HCC) Diagnosis: Active Problems:   DM2 (diabetes mellitus, type 2) (HCC)   Chronic systolic CHF (congestive heart failure) (HCC)   COPD (chronic obstructive pulmonary disease) (HCC)   Prolonged QT interval   Hypotension  Total Time spent with patient: 30 minutes  Past Psychiatric History: bipolar d/o  Past Medical History:  Past Medical History:  Diagnosis Date   Anxiety disorder    Asthma    Bipolar affective disorder (HCC)    Blood transfusion without reported diagnosis    CHF (congestive heart failure) (HCC)    COPD (chronic obstructive pulmonary disease) (HCC)    Depression    Diabetes mellitus    Migraine     Past Surgical History:  Procedure Laterality Date   KNEE ARTHROSCOPY     x 2   KNEE SURGERY Left    Incision made and knee cleaned out   TUBAL LIGATION     Family History:  Family History  Problem Relation Age of Onset   Bipolar disorder Mother    Hypertension Father    Diabetes Father    Family Psychiatric  History: see above Social History:  Social History   Substance and Sexual Activity  Alcohol Use Not Currently   Comment: occasionally     Social History   Substance and  Sexual Activity  Drug Use No    Social History   Socioeconomic History   Marital status: Divorced    Spouse name: Not on file   Number of children: Not on file   Years of education: Not on file   Highest education level: Not on file  Occupational History   Not on file  Tobacco Use   Smoking status: Every Day    Current packs/day: 1.00    Average packs/day: 1 pack/day for 29.0 years (29.0 ttl pk-yrs)    Types: Cigarettes   Smokeless tobacco: Never  Vaping Use   Vaping status: Former  Substance and Sexual Activity   Alcohol use: Not Currently    Comment: occasionally   Drug use: No   Sexual activity: Yes    Birth control/protection: Surgical  Other Topics Concern   Not on file  Social History Narrative   Patient lives in [place] with her 2 kids- 58 in 62 years old.   She has total 4 kids.   She is a single mom.         Social Determinants of Health   Financial Resource Strain: High Risk (09/30/2022)   Overall Financial Resource Strain (CARDIA)    Difficulty of Paying Living Expenses: Hard  Food Insecurity: No Food Insecurity (02/12/2023)   Hunger Vital Sign    Worried About Running Out of Food in the Last Year: Never true    Ran Out of  Food in the Last Year: Never true  Recent Concern: Food Insecurity - Food Insecurity Present (02/07/2023)   Hunger Vital Sign    Worried About Running Out of Food in the Last Year: Sometimes true    Ran Out of Food in the Last Year: Sometimes true  Transportation Needs: No Transportation Needs (02/12/2023)   PRAPARE - Administrator, Civil Service (Medical): No    Lack of Transportation (Non-Medical): No  Recent Concern: Transportation Needs - Unmet Transportation Needs (02/06/2023)   PRAPARE - Transportation    Lack of Transportation (Medical): Yes    Lack of Transportation (Non-Medical): Yes  Physical Activity: Not on file  Stress: No Stress Concern Present (01/07/2022)   Received from [facility], [facility]    [employer] [facility] Occupational [facility] - Occupational Stress Questionnaire    Feeling of Stress : Not at all  Social Connections: Unknown (10/19/2021)   Received from [facility], [facility]   Social Network    Social Network: Not on file   Additional Social History:      Sleep: Poor  Appetite:  Fair to poor  Current Medications: Current Facility-Administered Medications  Medication Dose Route Frequency Provider Last Rate Last Admin   acetaminophen (TYLENOL) tablet 650 mg  650 mg Oral Q6H PRN Maryagnes Amos, FNP   650 mg at 02/13/23 2137   alum & mag hydroxide-simeth (MAALOX/MYLANTA) 200-200-20 MG/5ML suspension 30 mL  30 mL Oral Q4H PRN Starkes-Perry, Juel Burrow, FNP       bumetanide (BUMEX) tablet 1 mg  1 mg Oral Daily Arnetha Courser, MD   1 mg at 02/16/23 0937   cholecalciferol (VITAMIN D3) 25 MCG (1000 UNIT) tablet 1,000 Units  1,000 Units Oral Daily Maryagnes Amos, FNP   1,000 Units at 02/16/23 0936   docusate sodium (COLACE) capsule 100 mg  100 mg Oral BID Maryagnes Amos, FNP   100 mg at 02/16/23 0936   feeding supplement (ENSURE ENLIVE / ENSURE PLUS) liquid 237 mL  237 mL Oral BID BM Mikey College T, MD   237 mL at 02/16/23 0945   haloperidol (HALDOL) tablet 5 mg  5 mg Oral TID PRN Maryagnes Amos, FNP       Or   haloperidol lactate (HALDOL) injection 5 mg  5 mg Intramuscular TID PRN Maryagnes Amos, FNP       hydrOXYzine (ATARAX) tablet 25 mg  25 mg Oral TID PRN Sindy Guadeloupe, NP   25 mg at 02/15/23 2201   influenza vac split trivalent PF (FLULAVAL) injection 0.5 mL  0.5 mL Intramuscular Tomorrow-1000 Lewanda Rife, MD       insulin aspart (novoLOG) injection 0-15 Units  0-15 Units Subcutaneous TID WC Maryagnes Amos, FNP   2 Units at 02/16/23 0924   insulin aspart (novoLOG) injection 0-5 Units  0-5 Units Subcutaneous QHS Maryagnes Amos, FNP   4 Units at 02/14/23 2129   insulin glargine-yfgn (SEMGLEE) injection 15 Units   15 Units Subcutaneous Daily Mikey College T, MD   15 Units at 02/16/23 1610   ipratropium-albuterol (DUONEB) 0.5-2.5 (3) MG/3ML nebulizer solution 3 mL  3 mL Nebulization Q6H PRN Starkes-Perry, Juel Burrow, FNP       lidocaine (XYLOCAINE) 5 % ointment   Topical QID PRN Starkes-Perry, Juel Burrow, FNP       LORazepam (ATIVAN) tablet 2 mg  2 mg Oral TID PRN Maryagnes Amos, FNP       Or  LORazepam (ATIVAN) injection 2 mg  2 mg Intramuscular TID PRN Maryagnes Amos, FNP       lurasidone (LATUDA) tablet 20 mg  20 mg Oral Q supper Lauree Chandler, NP   20 mg at 02/15/23 1700   magnesium hydroxide (MILK OF MAGNESIA) suspension 30 mL  30 mL Oral Daily PRN Maryagnes Amos, FNP       midodrine (PROAMATINE) tablet 5 mg  5 mg Oral TID WC Maryagnes Amos, FNP   5 mg at 02/16/23 0936   mometasone-formoterol (DULERA) 200-5 MCG/ACT inhaler 2 puff  2 puff Inhalation BID Maryagnes Amos, FNP   2 puff at 02/16/23 0935   nicotine polacrilex (NICORETTE) gum 2 mg  2 mg Oral PRN Maryagnes Amos, FNP   2 mg at 02/14/23 2134   oxyCODONE (Oxy IR/ROXICODONE) immediate release tablet 10 mg  10 mg Oral Q6H PRN Maryagnes Amos, FNP   10 mg at 02/16/23 0630   pneumococcal 20-valent conjugate vaccine (PREVNAR 20) injection 0.5 mL  0.5 mL Intramuscular Tomorrow-1000 Lewanda Rife, MD       polyethylene glycol (MIRALAX / GLYCOLAX) packet 17 g  17 g Oral Daily Starkes-Perry, Takia S, FNP       rosuvastatin (CRESTOR) tablet 20 mg  20 mg Oral Daily Maryagnes Amos, FNP   20 mg at 02/16/23 0935   senna (SENOKOT) tablet 8.6 mg  1 tablet Oral BID Cinderella, Margaret A   8.6 mg at 02/16/23 3086   zolpidem (AMBIEN) tablet 5 mg  5 mg Oral QHS Charm Rings, NP   5 mg at 02/15/23 2101    Lab Results:  Results for orders placed or performed during the hospital encounter of 02/12/23 (from the past 48 hour(s))  Glucose, capillary     Status: Abnormal   Collection Time: 02/14/23  11:21 AM  Result Value Ref Range   Glucose-Capillary 131 (H) 70 - 99 mg/dL    Comment: Glucose reference range applies only to samples taken after fasting for at least 8 hours.   Comment 1 Notify RN   Glucose, capillary     Status: Abnormal   Collection Time: 02/14/23  4:44 PM  Result Value Ref Range   Glucose-Capillary 140 (H) 70 - 99 mg/dL    Comment: Glucose reference range applies only to samples taken after fasting for at least 8 hours.  Glucose, capillary     Status: Abnormal   Collection Time: 02/14/23  9:24 PM  Result Value Ref Range   Glucose-Capillary 323 (H) 70 - 99 mg/dL    Comment: Glucose reference range applies only to samples taken after fasting for at least 8 hours.  Glucose, capillary     Status: Abnormal   Collection Time: 02/15/23  6:25 AM  Result Value Ref Range   Glucose-Capillary 126 (H) 70 - 99 mg/dL    Comment: Glucose reference range applies only to samples taken after fasting for at least 8 hours.  Glucose, capillary     Status: Abnormal   Collection Time: 02/15/23 11:29 AM  Result Value Ref Range   Glucose-Capillary 155 (H) 70 - 99 mg/dL    Comment: Glucose reference range applies only to samples taken after fasting for at least 8 hours.  Glucose, capillary     Status: Abnormal   Collection Time: 02/15/23  4:37 PM  Result Value Ref Range   Glucose-Capillary 193 (H) 70 - 99 mg/dL    Comment: Glucose reference range applies only  to samples taken after fasting for at least 8 hours.  Glucose, capillary     Status: Abnormal   Collection Time: 02/15/23  8:57 PM  Result Value Ref Range   Glucose-Capillary 178 (H) 70 - 99 mg/dL    Comment: Glucose reference range applies only to samples taken after fasting for at least 8 hours.  Glucose, capillary     Status: Abnormal   Collection Time: 02/16/23  9:34 AM  Result Value Ref Range   Glucose-Capillary 129 (H) 70 - 99 mg/dL    Comment: Glucose reference range applies only to samples taken after fasting for at  least 8 hours.    Blood Alcohol level:  Lab Results  Component Value Date   ETH <10 08/14/2022   ETH <10 07/30/2020    Metabolic Disorder Labs: Lab Results  Component Value Date   HGBA1C 8.4 (H) 02/07/2023   MPG 194.38 02/07/2023   MPG 269 08/27/2022   Lab Results  Component Value Date   PROLACTIN 10.6 08/31/2015   Lab Results  Component Value Date   CHOL 196 02/14/2023   TRIG 77 02/14/2023   HDL 47 02/14/2023   CHOLHDL 4.2 02/14/2023   VLDL 15 02/14/2023   LDLCALC 134 (H) 02/14/2023   LDLCALC 79 08/27/2022     Musculoskeletal: Strength & Muscle Tone: decreased Gait & Station: normal Patient leans: N/A  Psychiatric Specialty Exam: Physical Exam Vitals and nursing note reviewed.  Constitutional:      Appearance: Normal appearance.  HENT:     Head: Normocephalic.     Nose: Nose normal.  Pulmonary:     Effort: Pulmonary effort is normal.  Musculoskeletal:        General: Normal range of motion.     Cervical back: Normal range of motion.  Neurological:     General: No focal deficit present.     Mental Status: She is alert and oriented to person, place, and time.     Review of Systems  Psychiatric/Behavioral:  Positive for depression. The patient is nervous/anxious.   All other systems reviewed and are negative.   Blood pressure 106/69, pulse 82, temperature 98.1 F (36.7 C), resp. rate (!) 22, height 5\' 5"  (1.651 m), weight 73.7 kg, SpO2 99%.Body mass index is 27.04 kg/m.  General Appearance: Casual  Eye Contact:  Fair  Speech:  Clear and Coherent  Volume:  Normal  Mood:  Anxious and Depressed  Affect:  Congruent  Thought Process:  Coherent  Orientation:  Full (Time, Place, and Person)  Thought Content:  WDL and Logical  Suicidal Thoughts:  No  Homicidal Thoughts:  No  Memory:  Immediate;   Fair Recent;   Fair Remote;   Fair  Judgement:  Fair  Insight:  Good  Psychomotor Activity:  Decreased  Concentration:  Concentration: Good and Attention  Span: Fair  Recall:  Fair  Fund of Knowledge:  Good  Language:  Good  Akathisia:  No  Handed:  Right  AIMS (if indicated):     Assets:  Housing Leisure Time Resilience Social Support  ADL's:  Intact  Cognition:  WNL  Sleep: broken    Physical Exam: Physical Exam Vitals and nursing note reviewed.  Constitutional:      Appearance: Normal appearance.  HENT:     Head: Normocephalic.     Nose: Nose normal.  Pulmonary:     Effort: Pulmonary effort is normal.  Musculoskeletal:        General: Normal range of motion.  Cervical back: Normal range of motion.  Neurological:     General: No focal deficit present.     Mental Status: She is alert and oriented to person, place, and time.    Review of Systems  Psychiatric/Behavioral:  Positive for depression. The patient is nervous/anxious.   All other systems reviewed and are negative.  Blood pressure 106/69, pulse 82, temperature 98.1 F (36.7 C), resp. rate (!) 22, height 5\' 5"  (1.651 m), weight 73.7 kg, SpO2 99%. Body mass index is 27.04 kg/m.   Treatment Plan Summary: Daily contact with patient to assess and evaluate symptoms and progress in treatment, Medication management, and Plan : Bipolar 1 disorder -Latuda 20mg  oral daily with supper   Nicotine cessation -nicorette 2mg  oral as needed smoking cessation   Agitation PRNs -Haldol 5mg  PO or IM 3 times daily PRN agitation -Ativan 2mg  PO or IM 3 times daily PRN agitation   Anxiety -Hydroxyzine 25mg  oral 3 times daily PRN anxiety  Insomnia: Ambien 5 mg daily at bedtime  Nanine Means, NP 02/16/2023, 9:57 AM

## 2023-02-17 ENCOUNTER — Other Ambulatory Visit (HOSPITAL_COMMUNITY): Payer: Self-pay

## 2023-02-17 DIAGNOSIS — F3130 Bipolar disorder, current episode depressed, mild or moderate severity, unspecified: Secondary | ICD-10-CM | POA: Diagnosis not present

## 2023-02-17 LAB — GLUCOSE, CAPILLARY
Glucose-Capillary: 127 mg/dL — ABNORMAL HIGH (ref 70–99)
Glucose-Capillary: 161 mg/dL — ABNORMAL HIGH (ref 70–99)
Glucose-Capillary: 192 mg/dL — ABNORMAL HIGH (ref 70–99)
Glucose-Capillary: 161 mg/dL — ABNORMAL HIGH (ref 70–99)

## 2023-02-17 MED ORDER — LORAZEPAM 0.5 MG PO TABS: 0.5000 mg | ORAL_TABLET | Freq: Every day | ORAL | Status: DC | PRN

## 2023-02-17 MED ORDER — GLUCERNA SHAKE PO LIQD
237.0000 mL | Freq: Three times a day (TID) | ORAL | Status: DC
  Administered 2023-02-17 – 2023-02-22 (×16): 237 mL via ORAL

## 2023-02-17 MED ORDER — LORAZEPAM 0.5 MG PO TABS: 0.5000 mg | ORAL_TABLET | Freq: Two times a day (BID) | ORAL | Status: DC | PRN

## 2023-02-17 NOTE — Plan of Care (Signed)
  Problem: Education: Goal: Knowledge of General Education information will improve Description: Including pain rating scale, medication(s)/side effects and non-pharmacologic comfort measures Outcome: Progressing   Problem: Health Behavior/Discharge Planning: Goal: Ability to manage health-related needs will improve Outcome: Progressing   Problem: Activity: Goal: Risk for activity intolerance will decrease Outcome: Progressing   Problem: Nutrition: Goal: Adequate nutrition will be maintained Outcome: Progressing   Problem: Coping: Goal: Level of anxiety will decrease Outcome: Progressing   Problem: Pain Managment: Goal: General experience of comfort will improve Outcome: Not Progressing   Problem: Safety: Goal: Ability to remain free from injury will improve Outcome: Progressing

## 2023-02-17 NOTE — Group Note (Signed)
[facility] LCSW Group Therapy Note    Group Date: 02/17/2023 Start Time: 1415 End Time: 1500  Type of Therapy and Topic:  Group Therapy:  Overcoming Obstacles  Participation Level:  [facility] PARTICIPATION LEVEL: Did Not Attend  Mood:  Description of Group:   In this group patients will be encouraged to explore what they see as obstacles to their own wellness and recovery. They will be guided to discuss their thoughts, feelings, and behaviors related to these obstacles. The group will process together ways to cope with barriers, with attention given to specific choices patients can make. Each patient will be challenged to identify changes they are motivated to make in order to overcome their obstacles. This group will be process-oriented, with patients participating in exploration of their own experiences as well as giving and receiving support and challenge from other group members.  Therapeutic Goals: 1. Patient will identify personal and current obstacles as they relate to admission. 2. Patient will identify barriers that currently interfere with their wellness or overcoming obstacles.  3. Patient will identify feelings, thought process and behaviors related to these barriers. 4. Patient will identify two changes they are willing to make to overcome these obstacles:    Summary of Patient Progress   X   Therapeutic Modalities:   Cognitive Behavioral Therapy Solution Focused Therapy Motivational Interviewing Relapse Prevention Therapy   Harden Mo, LCSW

## 2023-02-17 NOTE — Plan of Care (Signed)
  Problem: Activity: Goal: Risk for activity intolerance will decrease Outcome: Progressing   Problem: Coping: Goal: Level of anxiety will decrease Outcome: Progressing  Patient appears less anxious no distress noted, and ambulated without SOB on exertion.

## 2023-02-17 NOTE — Group Note (Signed)
Date:  02/17/2023 Time:  5:21 PM  Group Topic/Focus:  Activity Group:  The focus of this group is to encourage patiens to go outside to the courtyard and get some fresh air and some exercise.     Participation Level:  Did Not Attend   [name] [name] 02/17/2023, 5:21 PM

## 2023-02-17 NOTE — Plan of Care (Signed)
Patient stayed in bed most of the shift.Patient pleasant and cooperative on approach.Patient did not attend groups even with encouragement. Patient denies SI,HI and AVH.Patient refused breakfast but ate lunch and dinner. Patient states that she does not like the food here. PRN medications helped with back pain. Support and encouragement given.

## 2023-02-17 NOTE — Progress Notes (Signed)
Consultation Progress Note   Patient: [name] [name] UJW:[id number] DOB: 03-Oct-1970 DOA: 02/12/2023 DOS: the patient was seen and examined on 02/17/2023 Primary service: Lewanda Rife, MD  Brief hospital course: Taken from consult note.   [name] [name] is a 52 y.o. female with past medical history of chronic HFrEF with LVEF 20-25%, COPD, IDDM, was admitted to psychiatry unit for management of depression.  Patient was recently hospitalized for CHF decompensation secondary to noncompliant with CHF medications.  Her CHF medication were adjusted during hospitalization, due to persistent orthostatic hypotension most of her blood pressure medication discontinued.  She was started on midodrine and Bumex 1 mg twice daily (cut down from 2 mg twice daily and discharged to psychiatry unit.  Medical team was asked to evaluate the patient for persistent borderline  hypotension, and slowly increase of creatinine reading on BMP as well as prolonged QTc interval.  Today, psychiatry unit nurse reported that patient blood pressure SBP in the lower 80s and patient reports feeling lethargic.  Patient denies any shortness of breath chest pain lightheadedness at this point.   She was appeared volume contracted, diuretic was held yesterday.  9/6: Vital stable with blood pressure of 119/75.  Creatinine improved to 0.64 from 1.18. Lipid panel with LDL of 134, goal is less than 70.  Patient recently started on Crestor. Clinically appears dry, decreasing the dose of Bumex to daily as she continued to have some dizziness.  9/7: Vital stable, patient with improved dizziness after decreasing the dose of Bumex to once daily which we will continue at this time.  9/8: Remains stable and euvolemic on current dose of Bumex.  Dizziness resolved but she mostly stays in bed stating that I am too depressed to do anything.  9/9: Patient remains stable and clinically appears euvolemic on low-dose of Bumex at 1 mg daily.  Dizziness  has been resolved.  Internal medicine service will sign off at this time, continue current dose of Bumex and she need to follow-up with her cardiologist for further recommendations. Please do not hesitate to call us back if needed  Assessment and Plan: * Bipolar I disorder, most recent episode depressed (HCC)-resolved as of 02/14/2023 -Being managed by psychiatry  Chronic systolic CHF (congestive heart failure) (HCC) Patient clinically appears dry with significant improvement in BNP to 446, it was 2678 8 days ago. -Decreasing the dose of Bumex to daily instead of twice daily. -Continue with daily dose of Bumex for now -Continue with daily weight  Hypotension Blood pressure currently within goal. -Continue with midodrine  Prolonged QT interval Improved.  Her psych medication changed, risperidone discontinued, and patient was started on Latuda.  Plan to check QTc level tomorrow again, if QTc level remains stable can consider check EKG sporadically, otherwise recommend daily EKG until QTc stabilized.   DM2 (diabetes mellitus, type 2) (HCC) CBG within goal. -Continue Semglee and SSI  COPD (chronic obstructive pulmonary disease) (HCC) -Stable, continue LABA, ICS and LAMA    TRH will sign off at this time  Subjective: Patient was seen and examined today.  Denies any chest pain or shortness of breath.  No dizziness with ambulation.  Physical Exam: Vitals:   02/15/23 2156 02/16/23 0500 02/16/23 0625 02/17/23 0655  BP: (!) 101/51  106/69 (!) 98/58  Pulse: 84  82 66  Resp:   (!) 22 12  Temp:   98.1 F (36.7 C) 98.6 F (37 C)  TempSrc:    Oral  SpO2: 100%  99% 100%  Weight:  73.7 kg    Height:       General.  Well-developed lady, in no acute distress. Pulmonary.  Lungs clear bilaterally, normal respiratory effort. CV.  Regular rate and rhythm, no JVD, rub or murmur. Abdomen.  Soft, nontender, nondistended, BS positive. CNS.  Alert and oriented .  No focal neurologic  deficit. Extremities.  No edema, no cyanosis, pulses intact and symmetrical. Psychiatry.  Judgment and insight appears normal.   Data Reviewed: Prior data reviewed  Family Communication: Discussed with patient  Time spent: 38 minutes.  This record has been created using Conservation officer, historic buildings. Errors have been sought and corrected,but may not always be located. Such creation errors do not reflect on the standard of care.   Author: Arnetha Courser, MD 02/17/2023 2:12 PM  For on call review [email or web address].

## 2023-02-17 NOTE — Progress Notes (Cosign Needed Addendum)
[facility] MD Progress Note  02/17/2023 7:48 PM CHARLANNE ILG  MRN:  [id number]  Subjective:  52 yo female admitted for depression with suicidal ideations. The client was out of her room today which was a good sign and reported mild depression with no suicidal ideations.  Her anxiety is still "real bad".  Her sleep was "good".  Appetite is good but not eating much because she does not like the food here, she is drinking her Glucerna shakes to supplement.  She wants to discharge and would like to go as soon as possible, discussed Wednesday as a possibility but will need to monitor because her symptoms were higher this weekend with much of her time spent in bed.  Denies side effects from her medications.  Principal Problem: Bipolar I disorder, most recent episode depressed (HCC) Diagnosis: Active Problems:   DM2 (diabetes mellitus, type 2) (HCC)   Chronic systolic CHF (congestive heart failure) (HCC)   COPD (chronic obstructive pulmonary disease) (HCC)   Prolonged QT interval   Hypotension  Total Time spent with patient: 30 minutes  Past Psychiatric History: bipolar d/o  Past Medical History:  Past Medical History:  Diagnosis Date   Anxiety disorder    Asthma    Bipolar affective disorder (HCC)    Blood transfusion without reported diagnosis    CHF (congestive heart failure) (HCC)    COPD (chronic obstructive pulmonary disease) (HCC)    Depression    Diabetes mellitus    Migraine     Past Surgical History:  Procedure Laterality Date   KNEE ARTHROSCOPY     x 2   KNEE SURGERY Left    Incision made and knee cleaned out   TUBAL LIGATION     Family History:  Family History  Problem Relation Age of Onset   Bipolar disorder Mother    Hypertension Father    Diabetes Father    Family Psychiatric  History: see above Social History:  Social History   Substance and Sexual Activity  Alcohol Use Not Currently   Comment: occasionally     Social History   Substance and Sexual Activity   Drug Use No    Social History   Socioeconomic History   Marital status: Divorced    Spouse name: Not on file   Number of children: Not on file   Years of education: Not on file   Highest education level: Not on file  Occupational History   Not on file  Tobacco Use   Smoking status: Every Day    Current packs/day: 1.00    Average packs/day: 1 pack/day for 29.0 years (29.0 ttl pk-yrs)    Types: Cigarettes   Smokeless tobacco: Never  Vaping Use   Vaping status: Former  Substance and Sexual Activity   Alcohol use: Not Currently    Comment: occasionally   Drug use: No   Sexual activity: Yes    Birth control/protection: Surgical  Other Topics Concern   Not on file  Social History Narrative   Patient lives in [place] with her 2 kids- 70 in 62 years old.   She has total 4 kids.   She is a single mom.         Social Determinants of Health   Financial Resource Strain: High Risk (09/30/2022)   Overall Financial Resource Strain (CARDIA)    Difficulty of Paying Living Expenses: Hard  Food Insecurity: No Food Insecurity (02/12/2023)   Hunger Vital Sign    Worried About Running Out of  Food in the Last Year: Never true    Ran Out of Food in the Last Year: Never true  Recent Concern: Food Insecurity - Food Insecurity Present (02/07/2023)   Hunger Vital Sign    Worried About Running Out of Food in the Last Year: Sometimes true    Ran Out of Food in the Last Year: Sometimes true  Transportation Needs: No Transportation Needs (02/12/2023)   PRAPARE - Administrator, Civil Service (Medical): No    Lack of Transportation (Non-Medical): No  Recent Concern: Transportation Needs - Unmet Transportation Needs (02/06/2023)   PRAPARE - Transportation    Lack of Transportation (Medical): Yes    Lack of Transportation (Non-Medical): Yes  Physical Activity: Not on file  Stress: No Stress Concern Present (01/07/2022)   Received from [facility], [facility]   [employer]  of Occupational Health - Occupational Stress Questionnaire    Feeling of Stress : Not at all  Social Connections: Unknown (10/19/2021)   Received from [facility], [facility]   Social Network    Social Network: Not on file   Additional Social History:      Sleep: Good  Appetite:  Fair   Current Medications: Current Facility-Administered Medications  Medication Dose Route Frequency Provider Last Rate Last Admin   acetaminophen (TYLENOL) tablet 650 mg  650 mg Oral Q6H PRN Maryagnes Amos, FNP   650 mg at 02/13/23 2137   alum & mag hydroxide-simeth (MAALOX/MYLANTA) 200-200-20 MG/5ML suspension 30 mL  30 mL Oral Q4H PRN Starkes-Perry, Juel Burrow, FNP       bumetanide (BUMEX) tablet 1 mg  1 mg Oral Daily Arnetha Courser, MD   1 mg at 02/17/23 0907   cholecalciferol (VITAMIN D3) 25 MCG (1000 UNIT) tablet 1,000 Units  1,000 Units Oral Daily Maryagnes Amos, FNP   1,000 Units at 02/17/23 0911   docusate sodium (COLACE) capsule 100 mg  100 mg Oral BID Maryagnes Amos, FNP   100 mg at 02/17/23 0911   feeding supplement (GLUCERNA SHAKE) (GLUCERNA SHAKE) liquid 237 mL  237 mL Oral TID BM Charm Rings, NP       haloperidol (HALDOL) tablet 5 mg  5 mg Oral TID PRN Maryagnes Amos, FNP       Or   haloperidol lactate (HALDOL) injection 5 mg  5 mg Intramuscular TID PRN Maryagnes Amos, FNP       hydrOXYzine (ATARAX) tablet 25 mg  25 mg Oral TID PRN Sindy Guadeloupe, NP   25 mg at 02/17/23 1710   influenza vac split trivalent PF (FLULAVAL) injection 0.5 mL  0.5 mL Intramuscular Tomorrow-1000 Lewanda Rife, MD       insulin aspart (novoLOG) injection 0-15 Units  0-15 Units Subcutaneous TID WC Maryagnes Amos, FNP   3 Units at 02/17/23 1708   insulin aspart (novoLOG) injection 0-5 Units  0-5 Units Subcutaneous QHS Maryagnes Amos, FNP   4 Units at 02/14/23 2129   insulin glargine-yfgn (SEMGLEE) injection 15 Units  15 Units Subcutaneous Daily Mikey College T,  MD   15 Units at 02/17/23 4098   ipratropium-albuterol (DUONEB) 0.5-2.5 (3) MG/3ML nebulizer solution 3 mL  3 mL Nebulization Q6H PRN Starkes-Perry, Juel Burrow, FNP       lidocaine (XYLOCAINE) 5 % ointment   Topical QID PRN Starkes-Perry, Juel Burrow, FNP       LORazepam (ATIVAN) tablet 2 mg  2 mg Oral TID PRN Rosario Adie Juel Burrow, FNP  Or   LORazepam (ATIVAN) injection 2 mg  2 mg Intramuscular TID PRN Maryagnes Amos, FNP       lurasidone (LATUDA) tablet 20 mg  20 mg Oral Q supper Lauree Chandler, NP   20 mg at 02/17/23 1709   magnesium hydroxide (MILK OF MAGNESIA) suspension 30 mL  30 mL Oral Daily PRN Maryagnes Amos, FNP       midodrine (PROAMATINE) tablet 5 mg  5 mg Oral TID WC Maryagnes Amos, FNP   5 mg at 02/17/23 1709   mometasone-formoterol (DULERA) 200-5 MCG/ACT inhaler 2 puff  2 puff Inhalation BID Maryagnes Amos, FNP   2 puff at 02/17/23 0911   nicotine polacrilex (NICORETTE) gum 2 mg  2 mg Oral PRN Maryagnes Amos, FNP   2 mg at 02/14/23 2134   oxyCODONE (Oxy IR/ROXICODONE) immediate release tablet 10 mg  10 mg Oral Q6H PRN Maryagnes Amos, FNP   10 mg at 02/17/23 1526   pneumococcal 20-valent conjugate vaccine (PREVNAR 20) injection 0.5 mL  0.5 mL Intramuscular Tomorrow-1000 Lewanda Rife, MD       polyethylene glycol (MIRALAX / GLYCOLAX) packet 17 g  17 g Oral Daily Starkes-Perry, Takia S, FNP       rosuvastatin (CRESTOR) tablet 20 mg  20 mg Oral Daily Maryagnes Amos, FNP   20 mg at 02/17/23 0908   senna (SENOKOT) tablet 8.6 mg  1 tablet Oral BID Cinderella, Margaret A   8.6 mg at 02/17/23 0908   zolpidem (AMBIEN) tablet 5 mg  5 mg Oral QHS Charm Rings, NP   5 mg at 02/16/23 2134    Lab Results:  Results for orders placed or performed during the hospital encounter of 02/12/23 (from the past 48 hour(s))  Glucose, capillary     Status: Abnormal   Collection Time: 02/15/23  8:57 PM  Result Value Ref Range    Glucose-Capillary 178 (H) 70 - 99 mg/dL    Comment: Glucose reference range applies only to samples taken after fasting for at least 8 hours.  Glucose, capillary     Status: Abnormal   Collection Time: 02/16/23  9:34 AM  Result Value Ref Range   Glucose-Capillary 129 (H) 70 - 99 mg/dL    Comment: Glucose reference range applies only to samples taken after fasting for at least 8 hours.  Glucose, capillary     Status: None   Collection Time: 02/16/23  2:02 PM  Result Value Ref Range   Glucose-Capillary 74 70 - 99 mg/dL    Comment: Glucose reference range applies only to samples taken after fasting for at least 8 hours.  Glucose, capillary     Status: Abnormal   Collection Time: 02/16/23  4:50 PM  Result Value Ref Range   Glucose-Capillary 168 (H) 70 - 99 mg/dL    Comment: Glucose reference range applies only to samples taken after fasting for at least 8 hours.  Glucose, capillary     Status: Abnormal   Collection Time: 02/16/23  9:30 PM  Result Value Ref Range   Glucose-Capillary 169 (H) 70 - 99 mg/dL    Comment: Glucose reference range applies only to samples taken after fasting for at least 8 hours.  Glucose, capillary     Status: Abnormal   Collection Time: 02/17/23  8:10 AM  Result Value Ref Range   Glucose-Capillary 127 (H) 70 - 99 mg/dL    Comment: Glucose reference range applies only to samples taken  after fasting for at least 8 hours.  Glucose, capillary     Status: Abnormal   Collection Time: 02/17/23 11:51 AM  Result Value Ref Range   Glucose-Capillary 161 (H) 70 - 99 mg/dL    Comment: Glucose reference range applies only to samples taken after fasting for at least 8 hours.  Glucose, capillary     Status: Abnormal   Collection Time: 02/17/23  4:44 PM  Result Value Ref Range   Glucose-Capillary 192 (H) 70 - 99 mg/dL    Comment: Glucose reference range applies only to samples taken after fasting for at least 8 hours.    Blood Alcohol level:  Lab Results  Component Value  Date   ETH <10 08/14/2022   ETH <10 07/30/2020    Metabolic Disorder Labs: Lab Results  Component Value Date   HGBA1C 8.4 (H) 02/07/2023   MPG 194.38 02/07/2023   MPG 269 08/27/2022   Lab Results  Component Value Date   PROLACTIN 10.6 08/31/2015   Lab Results  Component Value Date   CHOL 196 02/14/2023   TRIG 77 02/14/2023   HDL 47 02/14/2023   CHOLHDL 4.2 02/14/2023   VLDL 15 02/14/2023   LDLCALC 134 (H) 02/14/2023   LDLCALC 79 08/27/2022     Musculoskeletal: Strength & Muscle Tone: decreased Gait & Station: normal Patient leans: N/A  Psychiatric Specialty Exam: Physical Exam Vitals and nursing note reviewed.  Constitutional:      Appearance: Normal appearance.  HENT:     Head: Normocephalic.     Nose: Nose normal.  Pulmonary:     Effort: Pulmonary effort is normal.  Musculoskeletal:        General: Normal range of motion.     Cervical back: Normal range of motion.  Neurological:     General: No focal deficit present.     Mental Status: She is alert and oriented to person, place, and time.     Review of Systems  Psychiatric/Behavioral:  The patient is nervous/anxious.   All other systems reviewed and are negative.   Blood pressure 106/60, pulse 68, temperature (!) 97 F (36.1 C), resp. rate 12, height 5\' 5"  (1.651 m), weight 73.7 kg, SpO2 99%.Body mass index is 27.04 kg/m.  General Appearance: Casual  Eye Contact:  Fair  Speech:  Clear and Coherent  Volume:  Normal  Mood:  Anxious and Depressed  Affect:  Congruent  Thought Process:  Coherent  Orientation:  Full (Time, Place, and Person)  Thought Content:  WDL and Logical  Suicidal Thoughts:  No  Homicidal Thoughts:  No  Memory:  Immediate;   Fair Recent;   Fair Remote;   Fair  Judgement:  Fair  Insight:  Good  Psychomotor Activity:  Decreased  Concentration:  Concentration: Good and Attention Span: Fair  Recall:  Fair  Fund of Knowledge:  Good  Language:  Good  Akathisia:  No  Handed:   Right  AIMS (if indicated):     Assets:  Housing Leisure Time Resilience Social Support  ADL's:  Intact  Cognition:  WNL  Sleep: broken    Physical Exam: Physical Exam Vitals and nursing note reviewed.  Constitutional:      Appearance: Normal appearance.  HENT:     Head: Normocephalic.     Nose: Nose normal.  Pulmonary:     Effort: Pulmonary effort is normal.  Musculoskeletal:        General: Normal range of motion.     Cervical back: Normal range  of motion.  Neurological:     General: No focal deficit present.     Mental Status: She is alert and oriented to person, place, and time.    Review of Systems  Psychiatric/Behavioral:  The patient is nervous/anxious.   All other systems reviewed and are negative.  Blood pressure 106/60, pulse 68, temperature (!) 97 F (36.1 C), resp. rate 12, height 5\' 5"  (1.651 m), weight 73.7 kg, SpO2 99%. Body mass index is 27.04 kg/m.   Treatment Plan Summary: Daily contact with patient to assess and evaluate symptoms and progress in treatment, Medication management, and Plan : Bipolar 1 disorder -Latuda 20mg  oral daily with supper   Nicotine cessation -nicorette 2mg  oral as needed smoking cessation   Agitation PRNs -Haldol 5mg  PO or IM 3 times daily PRN agitation -Ativan 2mg  PO or IM 3 times daily PRN agitation   Anxiety -Hydroxyzine 25mg  oral 3 times daily PRN anxiety -Ativan 0.5 mg daily PRN, not to be given after 6 pm because of Ambien at bedtime  Insomnia: Ambien 5 mg daily at bedtime  Nanine Means, NP 02/17/2023, 7:48 PM

## 2023-02-17 NOTE — Group Note (Signed)
Recreation Therapy Group Note   Group Topic:General Recreation  Group Date: 02/17/2023 Start Time: 1000 End Time: 1100 Facilitators: Rosina Lowenstein, LRT, CTRS Location: Courtyard  Group Description: Outdoor Recreation. Patients had the option to play basketball, corn hole, or draw with chalk and listen to music while outside in the courtyard getting fresh air and sunlight. LRT and pts discussed things that they enjoy doing in their free time outside of the hospital.  Goal Area(s) Addressed: Patient will identify leisure interests.  Patient will practice healthy decision making. Patient will engage in recreation activity.   Affect/Mood: N/A   Participation Level: Did not attend    Clinical Observations/Individualized Feedback: Hamda did not attend group.  Plan: Continue to engage patient in RT group sessions 2-3x/week.   Rosina Lowenstein, LRT, CTRS 02/17/2023 12:26 PM

## 2023-02-17 NOTE — Group Note (Signed)
Date:  02/17/2023 Time:  9:45 PM  Group Topic/Focus:  Wrap-Up Group:   The focus of this group is to help patients review their daily goal of treatment and discuss progress on daily workbooks.    Participation Level:  Active  Participation Quality:  Appropriate and Attentive  Affect:  Appropriate and Flat  Cognitive:  Alert  Insight: Appropriate and Good  Engagement in Group:  Limited  Modes of Intervention:  Discussion and Education  Additional Comments:     Maglione,Amaar Oshita E 02/17/2023, 9:45 PM

## 2023-02-17 NOTE — Progress Notes (Signed)
Patient is pleasant and cooperative. Denies SI, HI, AVH. Endorses some depression but reports it is improving. Patient did shower this evening and attend group. Minimal interaction with peers and staff. Came out for meds and needs. Voiced no other concerns or complaints. Encouragement and support provided safety checks maintained. Medications given as prescribed. Pt remains safe on unit with q 15 min checks.

## 2023-02-18 DIAGNOSIS — F3130 Bipolar disorder, current episode depressed, mild or moderate severity, unspecified: Secondary | ICD-10-CM | POA: Diagnosis not present

## 2023-02-18 LAB — GLUCOSE, CAPILLARY
Glucose-Capillary: 129 mg/dL — ABNORMAL HIGH (ref 70–99)
Glucose-Capillary: 123 mg/dL — ABNORMAL HIGH (ref 70–99)
Glucose-Capillary: 161 mg/dL — ABNORMAL HIGH (ref 70–99)
Glucose-Capillary: 174 mg/dL — ABNORMAL HIGH (ref 70–99)
Glucose-Capillary: 168 mg/dL — ABNORMAL HIGH (ref 70–99)

## 2023-02-18 NOTE — Group Note (Signed)
Date:  02/18/2023 Time:  4:17 PM  Group Topic/Focus:  Outdoor recreation. Music Therapy Rapport building.    Participation Level:  Did Not Attend   [name] 02/18/2023, 4:17 PM

## 2023-02-18 NOTE — Plan of Care (Signed)
  Problem: Coping: Goal: Level of anxiety will decrease Outcome: Progressing   Problem: Safety: Goal: Ability to remain free from injury will improve Outcome: Progressing   Problem: Coping: Goal: Coping ability will improve Outcome: Progressing   Problem: Safety: Goal: Ability to disclose and discuss suicidal ideas will improve Outcome: Progressing

## 2023-02-18 NOTE — Group Note (Signed)
Date:  02/18/2023 Time:  10:12 AM  Group Topic/Focus:  Goals Group:   The focus of this group is to help patients establish daily goals to achieve during treatment and discuss how the patient can incorporate goal setting into their daily lives to aide in recovery. Outdoor Recreation Structured Pharmacist, community.    Participation Level:  Did Not Attend   [name] [name] 02/18/2023, 10:12 AM

## 2023-02-18 NOTE — [facility] Counselor (Signed)
CSW contacted the patient's collaters: Jhoseline, [place] [phone], daughter, Phone number not in service [place], [phone], Sister--Sister reports that she lives in [place] and is not a support to patient.  He reports that patient "is very dramatic".  She reports that she does not believe that pt has weapons, however could not confirm this as she has not engaged to patient in some time. She reports that patient has "3 adult children you can call". She reports that patient's son Tinnie Gens, "who goes by [name] is a career criminal", "Judie Grieve has his own mental health issues and is Schizophrenic should be around" and her daughter "Cicero Duck is always in an abusive relationship". Dalpe,Jeffrey. [phone], Wrong number Fadumo, Rahill [phone], Son--left HIPAA compliant voicemail.  Penni Homans, MSW, LCSW 02/18/2023 11:13 AM

## 2023-02-18 NOTE — Progress Notes (Signed)
Patient pleasant and cooperative. Remains isolative to self. Out for meds meals and snacks. Denies SI, HI, AVH. Reports improved mood. Request prn for sleep, pain and anxiety. Given with some relief. Encouragement and support provided. Safety checks maintained. Medications given as prescribed. Pt receptive and remains safe on unit with q 15 min checks.

## 2023-02-18 NOTE — Group Note (Signed)
[facility] LCSW Group Therapy Note   Group Date: 02/18/2023 Start Time: 1300 End Time: 1400  Type of Therapy/Topic:  Group Therapy:  Feelings about Diagnosis  Participation Level:  Did Not Attend    Description of Group:    This group will allow patients to explore their thoughts and feelings about diagnoses they have received. Patients will be guided to explore their level of understanding and acceptance of these diagnoses. Facilitator will encourage patients to process their thoughts and feelings about the reactions of others to their diagnosis, and will guide patients in identifying ways to discuss their diagnosis with significant others in their lives. This group will be process-oriented, with patients participating in exploration of their own experiences as well as giving and receiving support and challenge from other group members.   Therapeutic Goals: 1. Patient will demonstrate understanding of diagnosis as evidence by identifying two or more symptoms of the disorder:  2. Patient will be able to express two feelings regarding the diagnosis 3. Patient will demonstrate ability to communicate their needs through discussion and/or role plays  Summary of Patient Progress: X   Therapeutic Modalities:   Cognitive Behavioral Therapy Brief Therapy Feelings Identification    Glenis Smoker, LCSW

## 2023-02-18 NOTE — [facility] Counselor (Signed)
CSW spoke with patient at [employer] request.    CSW discussed the need to secure the patient's weapons.    CSW asked patient her goals. Patient stated that her goal was still to "blow her brains out."  Patient reports that she does not have anyone in her life to offer any support.   She provides verbal permission for this writer to contact her emergency contacts, however, stated that she is unsure if numbers in chart are the same or if they would offer support.    She again stated that no one has a key to her home.  Penni Homans, MSW, LCSW 02/18/2023 10:51 AM

## 2023-02-18 NOTE — Progress Notes (Signed)
[facility] MD Progress Note  02/18/2023 10:51 AM [name] [name]  MRN:  [id number]  Subjective:   Pt chart reviewed, discussed with interdisciplinary team, and seen on rounds. She reports she feels "sleepy" today. Reports poor sleep and appetite since admission due to the new environment and not liking the food. Declines medication changes as she reports at home her sleep and appetite are good. Reports she has been drinking her ensures. She denies suicidal, homicidal ideations. She denies auditory visual hallucinations or paranoia. She is hoping to discharge soon. She was admitted after verbalizing suicidal ideations with plan to "blow brains out". She has previously told this Clinical research associate she has 3 firearms. Today, she agrees for her firearms to be removed from her home. She gives verbal consent for her emergency contacts in her chart to be called Noland Fordyce [phone], Eino Farber [phone], Wylie Hail [phone], and Forestine Na [phone]. Judie Grieve did not answer. Jeffrey's number a female picked up and said that this was a new phone number for her. Vernona Rieger did not pick up. Cicero Duck did not pick up. Safe discharge plan is pending. Have consulted with social work for safe discharge planning.  Principal Problem: Bipolar I disorder, most recent episode depressed (HCC) Diagnosis: Active Problems:   DM2 (diabetes mellitus, type 2) (HCC)   Chronic systolic CHF (congestive heart failure) (HCC)   COPD (chronic obstructive pulmonary disease) (HCC)   Prolonged QT interval   Hypotension  Total Time spent with patient:  25 minutes  Past Psychiatric History: bipolar d/o  Past Medical History:  Past Medical History:  Diagnosis Date   Anxiety disorder    Asthma    Bipolar affective disorder (HCC)    Blood transfusion without reported diagnosis    CHF (congestive heart failure) (HCC)    COPD (chronic obstructive pulmonary disease) (HCC)    Depression    Diabetes mellitus    Migraine     Past  Surgical History:  Procedure Laterality Date   KNEE ARTHROSCOPY     x 2   KNEE SURGERY Left    Incision made and knee cleaned out   TUBAL LIGATION     Family History:  Family History  Problem Relation Age of Onset   Bipolar disorder Mother    Hypertension Father    Diabetes Father    Family Psychiatric  History: see above Social History:  Social History   Substance and Sexual Activity  Alcohol Use Not Currently   Comment: occasionally     Social History   Substance and Sexual Activity  Drug Use No    Social History   Socioeconomic History   Marital status: Divorced    Spouse name: Not on file   Number of children: Not on file   Years of education: Not on file   Highest education level: Not on file  Occupational History   Not on file  Tobacco Use   Smoking status: Every Day    Current packs/day: 1.00    Average packs/day: 1 pack/day for 29.0 years (29.0 ttl pk-yrs)    Types: Cigarettes   Smokeless tobacco: Never  Vaping Use   Vaping status: Former  Substance and Sexual Activity   Alcohol use: Not Currently    Comment: occasionally   Drug use: No   Sexual activity: Yes    Birth control/protection: Surgical  Other Topics Concern   Not on file  Social History Narrative   Patient lives in [place] with her 2 kids- 24 in 39 years old.  She has total 4 kids.   She is a single mom.         Social Determinants of Health   Financial Resource Strain: High Risk (09/30/2022)   Overall Financial Resource Strain (CARDIA)    Difficulty of Paying Living Expenses: Hard  Food Insecurity: No Food Insecurity (02/12/2023)   Hunger Vital Sign    Worried About Running Out of Food in the Last Year: Never true    Ran Out of Food in the Last Year: Never true  Recent Concern: Food Insecurity - Food Insecurity Present (02/07/2023)   Hunger Vital Sign    Worried About Running Out of Food in the Last Year: Sometimes true    Ran Out of Food in the Last Year: Sometimes true   Transportation Needs: No Transportation Needs (02/12/2023)   PRAPARE - Administrator, Civil Service (Medical): No    Lack of Transportation (Non-Medical): No  Recent Concern: Transportation Needs - Unmet Transportation Needs (02/06/2023)   PRAPARE - Transportation    Lack of Transportation (Medical): Yes    Lack of Transportation (Non-Medical): Yes  Physical Activity: Not on file  Stress: No Stress Concern Present (01/07/2022)   [facility] from [facility]   [employer] [facility] - Occupational Stress Questionnaire    Feeling of Stress : Not at all  Social Connections: Unknown (10/19/2021)   Received from [facility]   Social Network    Social Network: Not on file   Sleep: Poor  Appetite:  Poor  Current Medications: Current Facility-Administered Medications  Medication Dose Route Frequency Provider Last Rate Last Admin   acetaminophen (TYLENOL) tablet 650 mg  650 mg Oral Q6H PRN Maryagnes Amos, FNP   650 mg at 02/13/23 2137   alum & mag hydroxide-simeth (MAALOX/MYLANTA) 200-200-20 MG/5ML suspension 30 mL  30 mL Oral Q4H PRN Starkes-Perry, Juel Burrow, FNP       bumetanide (BUMEX) tablet 1 mg  1 mg Oral Daily Arnetha Courser, MD   1 mg at 02/18/23 0905   cholecalciferol (VITAMIN D3) 25 MCG (1000 UNIT) tablet 1,000 Units  1,000 Units Oral Daily Maryagnes Amos, FNP   1,000 Units at 02/18/23 0905   docusate sodium (COLACE) capsule 100 mg  100 mg Oral BID Maryagnes Amos, FNP   100 mg at 02/18/23 0905   feeding supplement (GLUCERNA SHAKE) (GLUCERNA SHAKE) liquid 237 mL  237 mL Oral TID BM Charm Rings, NP   237 mL at 02/17/23 2239   haloperidol (HALDOL) tablet 5 mg  5 mg Oral TID PRN Maryagnes Amos, FNP       Or   haloperidol lactate (HALDOL) injection 5 mg  5 mg Intramuscular TID PRN Maryagnes Amos, FNP       hydrOXYzine (ATARAX) tablet 25 mg  25 mg Oral TID PRN Sindy Guadeloupe, NP   25 mg at  02/17/23 1710   influenza vac split trivalent PF (FLULAVAL) injection 0.5 mL  0.5 mL Intramuscular Tomorrow-1000 Lewanda Rife, MD       insulin aspart (novoLOG) injection 0-15 Units  0-15 Units Subcutaneous TID WC Maryagnes Amos, FNP   2 Units at 02/18/23 0903   insulin aspart (novoLOG) injection 0-5 Units  0-5 Units Subcutaneous QHS Maryagnes Amos, FNP   4 Units at 02/14/23 2129   insulin glargine-yfgn (SEMGLEE) injection 15 Units  15 Units Subcutaneous Daily Emeline General, MD   15 Units at 02/18/23 [phone]  ipratropium-albuterol (DUONEB) 0.5-2.5 (3) MG/3ML nebulizer solution 3 mL  3 mL Nebulization Q6H PRN Starkes-Perry, Juel Burrow, FNP       lidocaine (XYLOCAINE) 5 % ointment   Topical QID PRN Starkes-Perry, Juel Burrow, FNP       LORazepam (ATIVAN) tablet 2 mg  2 mg Oral TID PRN Maryagnes Amos, FNP       Or   LORazepam (ATIVAN) injection 2 mg  2 mg Intramuscular TID PRN Rosario Adie, Juel Burrow, FNP       LORazepam (ATIVAN) tablet 0.5 mg  0.5 mg Oral Daily PRN Charm Rings, NP       lurasidone (LATUDA) tablet 20 mg  20 mg Oral Q supper Lauree Chandler, NP   20 mg at 02/17/23 1709   magnesium hydroxide (MILK OF MAGNESIA) suspension 30 mL  30 mL Oral Daily PRN Maryagnes Amos, FNP       midodrine (PROAMATINE) tablet 5 mg  5 mg Oral TID WC Maryagnes Amos, FNP   5 mg at 02/18/23 0906   mometasone-formoterol (DULERA) 200-5 MCG/ACT inhaler 2 puff  2 puff Inhalation BID Maryagnes Amos, FNP   2 puff at 02/18/23 0905   nicotine polacrilex (NICORETTE) gum 2 mg  2 mg Oral PRN Maryagnes Amos, FNP   2 mg at 02/14/23 2134   oxyCODONE (Oxy IR/ROXICODONE) immediate release tablet 10 mg  10 mg Oral Q6H PRN Maryagnes Amos, FNP   10 mg at 02/18/23 0908   pneumococcal 20-valent conjugate vaccine (PREVNAR 20) injection 0.5 mL  0.5 mL Intramuscular Tomorrow-1000 Lewanda Rife, MD       polyethylene glycol (MIRALAX / GLYCOLAX) packet 17 g  17 g Oral  Daily Starkes-Perry, Takia S, FNP       rosuvastatin (CRESTOR) tablet 20 mg  20 mg Oral Daily Maryagnes Amos, FNP   20 mg at 02/18/23 4098   senna (SENOKOT) tablet 8.6 mg  1 tablet Oral BID Cinderella, Margaret A   8.6 mg at 02/18/23 0906   zolpidem (AMBIEN) tablet 5 mg  5 mg Oral QHS Charm Rings, NP   5 mg at 02/17/23 2123    Lab Results:  Results for orders placed or performed during the hospital encounter of 02/12/23 (from the past 48 hour(s))  Glucose, capillary     Status: None   Collection Time: 02/16/23  2:02 PM  Result Value Ref Range   Glucose-Capillary 74 70 - 99 mg/dL    Comment: Glucose reference range applies only to samples taken after fasting for at least 8 hours.  Glucose, capillary     Status: Abnormal   Collection Time: 02/16/23  4:50 PM  Result Value Ref Range   Glucose-Capillary 168 (H) 70 - 99 mg/dL    Comment: Glucose reference range applies only to samples taken after fasting for at least 8 hours.  Glucose, capillary     Status: Abnormal   Collection Time: 02/16/23  9:30 PM  Result Value Ref Range   Glucose-Capillary 169 (H) 70 - 99 mg/dL    Comment: Glucose reference range applies only to samples taken after fasting for at least 8 hours.  Glucose, capillary     Status: Abnormal   Collection Time: 02/17/23  8:10 AM  Result Value Ref Range   Glucose-Capillary 127 (H) 70 - 99 mg/dL    Comment: Glucose reference range applies only to samples taken after fasting for at least 8 hours.  Glucose, capillary  Status: Abnormal   Collection Time: 02/17/23 11:51 AM  Result Value Ref Range   Glucose-Capillary 161 (H) 70 - 99 mg/dL    Comment: Glucose reference range applies only to samples taken after fasting for at least 8 hours.  Glucose, capillary     Status: Abnormal   Collection Time: 02/17/23  4:44 PM  Result Value Ref Range   Glucose-Capillary 192 (H) 70 - 99 mg/dL    Comment: Glucose reference range applies only to samples taken after fasting for  at least 8 hours.  Glucose, capillary     Status: Abnormal   Collection Time: 02/17/23  8:32 PM  Result Value Ref Range   Glucose-Capillary 161 (H) 70 - 99 mg/dL    Comment: Glucose reference range applies only to samples taken after fasting for at least 8 hours.  Glucose, capillary     Status: Abnormal   Collection Time: 02/18/23  6:51 AM  Result Value Ref Range   Glucose-Capillary 129 (H) 70 - 99 mg/dL    Comment: Glucose reference range applies only to samples taken after fasting for at least 8 hours.  Glucose, capillary     Status: Abnormal   Collection Time: 02/18/23  9:02 AM  Result Value Ref Range   Glucose-Capillary 123 (H) 70 - 99 mg/dL    Comment: Glucose reference range applies only to samples taken after fasting for at least 8 hours.    Blood Alcohol level:  Lab Results  Component Value Date   ETH <10 08/14/2022   ETH <10 07/30/2020    Metabolic Disorder Labs: Lab Results  Component Value Date   HGBA1C 8.4 (H) 02/07/2023   MPG 194.38 02/07/2023   MPG 269 08/27/2022   Lab Results  Component Value Date   PROLACTIN 10.6 08/31/2015   Lab Results  Component Value Date   CHOL 196 02/14/2023   TRIG 77 02/14/2023   HDL 47 02/14/2023   CHOLHDL 4.2 02/14/2023   VLDL 15 02/14/2023   LDLCALC 134 (H) 02/14/2023   LDLCALC 79 08/27/2022    Physical Findings: AIMS:  , ,  ,  ,    CIWA:    COWS:     Musculoskeletal: Strength & Muscle Tone: within normal limits Gait & Station: normal Patient leans: N/A  Psychiatric Specialty Exam:  Presentation  General Appearance:  Appropriate for Environment  Eye Contact: Fair  Speech: Clear and Coherent; Normal Rate  Speech Volume: Normal  Handedness: Right   Mood and Affect  Mood: -- ("sleepy")  Affect: Flat   Thought Process  Thought Processes: Coherent; Goal Directed; Linear  Descriptions of Associations:Intact  Orientation:Full (Time, Place and Person)  Thought  Content:WDL  Hallucinations:Hallucinations: None  Ideas of Reference:None  Suicidal Thoughts:Suicidal Thoughts: No  Homicidal Thoughts:Homicidal Thoughts: No   Sensorium  Memory: Immediate Good; Recent Good; Remote Good  Judgment: Intact  Insight: Present   Executive Functions  Concentration: Good  Attention Span: Good  Recall: Good  Fund of Knowledge: Good  Language: Good   Psychomotor Activity  Psychomotor Activity:Psychomotor Activity: Normal   Assets  Assets: Communication Skills; Desire for Improvement; Financial Resources/Insurance; Housing; Resilience; Social Support   Sleep  Sleep:Sleep: Poor    Physical Exam: Physical Exam Constitutional:      General: She is not in acute distress.    Appearance: She is not ill-appearing, toxic-appearing or diaphoretic.  Eyes:     General: No scleral icterus. Cardiovascular:     Rate and Rhythm: Normal rate.  Pulmonary:  Effort: Pulmonary effort is normal. No respiratory distress.  Neurological:     Mental Status: She is alert and oriented to person, place, and time.  Psychiatric:        Attention and Perception: Attention and perception normal.        Mood and Affect: Affect is flat.        Speech: Speech normal.        Behavior: Behavior normal. Behavior is cooperative.        Thought Content: Thought content normal.    Review of Systems  Constitutional:  Negative for chills and fever.  Respiratory:  Negative for shortness of breath.   Cardiovascular:  Negative for chest pain and palpitations.  Gastrointestinal:  Negative for abdominal pain.  Neurological:  Negative for headaches.   Blood pressure (!) 91/56, pulse 66, temperature (!) 97.3 F (36.3 C), resp. rate 18, height 5\' 5"  (1.651 m), weight 73.7 kg, SpO2 97%. Body mass index is 27.04 kg/m.   Treatment Plan Summary: Daily contact with patient to assess and evaluate symptoms and progress in treatment, Medication management, and  Plan    Bipolar 1 disorder -Latuda 20mg  oral daily with supper   Nicotine cessation -nicorette 2mg  oral as needed smoking cessation   Agitation PRNs -Haldol 5mg  PO or IM 3 times daily PRN agitation -Ativan 2mg  PO or IM 3 times daily PRN agitation   Anxiety -Hydroxyzine 25mg  oral 3 times daily PRN anxiety -Ativan 0.5 mg daily PRN, not to be given after 6 pm because of Ambien at bedtime   Insomnia: Ambien 5 mg daily at bedtime  Lauree Chandler, NP 02/18/2023, 10:51 AM

## 2023-02-18 NOTE — Group Note (Signed)
Recreation Therapy Group Note   Group Topic:Problem Solving  Group Date: 02/18/2023 Start Time: 1000 End Time: 1055 Facilitators: Rosina Lowenstein, LRT, CTRS Location: Craft Room  Group Description: Life Boat. Patients were given the scenario that they are on a boat that is about to become shipwrecked, leaving them stranded on an [place]. They are asked to make a list of 15 different items that they want to take with them when they are stranded on the [place]. Patients are asked to rank their items from most important to least important, #1 being the most important and #15 being the least. Patients will work individually for the first round to come up with 15 items and then pair up with a peer(s) to condense their list and come up with one list of 15 items between the two of them. Patients or LRT will read aloud the 15 different items to the group after each round. LRT facilitated post-activity processing to discuss how this activity can be used in daily life post discharge.   Goal Area(s) Addressed:  Patient will identify priorities, wants and needs. Patient will communicate with LRT and peers. Patient will work collectively as a Administrator, Civil Service. Patient will work on Product manager.    Affect/Mood: N/A   Participation Level: Did not attend    Clinical Observations/Individualized Feedback: [name] [name] did not attend group.   Plan: Continue to engage patient in RT group sessions 2-3x/week.   Rosina Lowenstein, LRT, CTRS 02/18/2023 11:14 AM

## 2023-02-18 NOTE — Progress Notes (Signed)
   02/18/23 1100  Psych Admission Type (Psych Patients Only)  Admission Status Involuntary  Psychosocial Assessment  Patient Complaints Anxiety;Depression  Eye Contact Fair  Facial Expression Flat  Affect Anxious  Speech Logical/coherent  Interaction Minimal  Motor Activity Slow  Appearance/Hygiene Unremarkable  Behavior Characteristics Cooperative;Appropriate to situation  Mood Anxious;Pleasant  Thought Process  Coherency WDL  Content WDL  Delusions None reported or observed  Perception WDL  Hallucination None reported or observed  Judgment Impaired  Confusion None  Danger to Self  Current suicidal ideation? Denies  Danger to Others  Danger to Others None reported or observed   No hyper/hypoglycemic symptoms noted this shift. Tolerated all meals and medications. Medicated for anxiety as requested. Denies SI/HI/AVH.

## 2023-02-18 NOTE — Group Note (Signed)
Date:  02/18/2023 Time:  9:44 PM  Group Topic/Focus:  Personal Choices and Values:   The focus of this group is to help patients assess and explore the importance of values in their lives, how their values affect their decisions, how they express their values and what opposes their expression.    Participation Level:  Active  Participation Quality:  Appropriate  Affect:  Appropriate  Cognitive:  Alert and Appropriate  Insight: Good  Engagement in Group:  Developing/Improving and Improving  Modes of Intervention:  Activity, Clarification, Discussion, Education, Exploration, Problem-solving, Rapport Building, Dance movement psychotherapist, Socialization, and Support  Additional Comments:     [name] [name] 02/18/2023, 9:44 PM

## 2023-02-19 DIAGNOSIS — F3130 Bipolar disorder, current episode depressed, mild or moderate severity, unspecified: Secondary | ICD-10-CM | POA: Diagnosis not present

## 2023-02-19 LAB — GLUCOSE, CAPILLARY
Glucose-Capillary: 109 mg/dL — ABNORMAL HIGH (ref 70–99)
Glucose-Capillary: 129 mg/dL — ABNORMAL HIGH (ref 70–99)
Glucose-Capillary: 136 mg/dL — ABNORMAL HIGH (ref 70–99)
Glucose-Capillary: 183 mg/dL — ABNORMAL HIGH (ref 70–99)

## 2023-02-19 MED ORDER — HYDROXYZINE HCL 50 MG PO TABS
50.0000 mg | ORAL_TABLET | Freq: Three times a day (TID) | ORAL | Status: DC | PRN
  Administered 2023-02-19: 50 mg via ORAL
  Filled 2023-02-19 (×3): qty 1

## 2023-02-19 MED ORDER — LURASIDONE HCL 40 MG PO TABS
40.0000 mg | ORAL_TABLET | Freq: Every day | ORAL | Status: DC
  Administered 2023-02-19 – 2023-02-20 (×2): 40 mg via ORAL
  Filled 2023-02-19 (×2): qty 1

## 2023-02-19 NOTE — Group Note (Signed)
LCSW Group Therapy Note  Group Date: 02/19/2023 Start Time: 1330 End Time: 1430   Type of Therapy and Topic:  Group Therapy: Anger Cues and Responses  Participation Level:  Did Not Attend   Description of Group:   In this group, patients learned how to recognize the physical, cognitive, emotional, and behavioral responses they have to anger-provoking situations.  They identified a recent time they became angry and how they reacted.  They analyzed how their reaction was possibly beneficial and how it was possibly unhelpful.  The group discussed a variety of healthier coping skills that could help with such a situation in the future.  Focus was placed on how helpful it is to recognize the underlying emotions to our anger, because working on those can lead to a more permanent solution as well as our ability to focus on the important rather than the urgent.  Therapeutic Goals: Patients will remember their last incident of anger and how they felt emotionally and physically, what their thoughts were at the time, and how they behaved. Patients will identify how their behavior at that time worked for them, as well as how it worked against them. Patients will explore possible new behaviors to use in future anger situations. Patients will learn that anger itself is normal and cannot be eliminated, and that healthier reactions can assist with resolving conflict rather than worsening situations.  Summary of Patient Progress:   X  Therapeutic Modalities:   Cognitive Behavioral Therapy    Harden Mo, LCSW 02/19/2023  3:43 PM

## 2023-02-19 NOTE — Progress Notes (Signed)
[facility] MD Progress Note  02/19/2023 1:35 PM [name] [name]  MRN:  [id number]  Subjective:  Pt chart reviewed, discussed with interdisciplinary team, and seen on rounds. Reports mood today is "little depressed". She states she has been feeling this way throughout admission, although only verbalized it today. She denies suicidal ideations. When asked about verbalizing to social work her goal was to "blow her brains out" yesterday, states she did feel that way. She tells me today "I don't even own guns". When asked about how she had told me she owns 3 firearms at admission, states "I don't remember saying that". She denies homicidal ideations. She denies auditory visual hallucinations or paranoia. She tells me her appetite is poor because the food is not good and that her sleep is not good because of all the noise on the unit. We discussed increasing her Latuda today from 20mg  to 40mg . She is in agreement. Pt has been mostly staying in her room. Encouraged her to be proactive in her treatment and attend groups.  Principal Problem: Bipolar I disorder, most recent episode depressed (HCC)  Diagnosis: Active Problems:   DM2 (diabetes mellitus, type 2) (HCC)   Chronic systolic CHF (congestive heart failure) (HCC)   COPD (chronic obstructive pulmonary disease) (HCC)   Prolonged QT interval   Hypotension  Total Time spent with patient: 30 minutes  Past Psychiatric History: bipolar d/o  Past Medical History:  Past Medical History:  Diagnosis Date   Anxiety disorder    Asthma    Bipolar affective disorder (HCC)    Blood transfusion without reported diagnosis    CHF (congestive heart failure) (HCC)    COPD (chronic obstructive pulmonary disease) (HCC)    Depression    Diabetes mellitus    Migraine     Past Surgical History:  Procedure Laterality Date   KNEE ARTHROSCOPY     x 2   KNEE SURGERY Left    Incision made and knee cleaned out   TUBAL LIGATION     Family History:  Family History   Problem Relation Age of Onset   Bipolar disorder Mother    Hypertension Father    Diabetes Father    Family Psychiatric  History: see above Social History:  Social History   Substance and Sexual Activity  Alcohol Use Not Currently   Comment: occasionally     Social History   Substance and Sexual Activity  Drug Use No    Social History   Socioeconomic History   Marital status: Divorced    Spouse name: Not on file   Number of children: Not on file   Years of education: Not on file   Highest education level: Not on file  Occupational History   Not on file  Tobacco Use   Smoking status: Every Day    Current packs/day: 1.00    Average packs/day: 1 pack/day for 29.0 years (29.0 ttl pk-yrs)    Types: Cigarettes   Smokeless tobacco: Never  Vaping Use   Vaping status: Former  Substance and Sexual Activity   Alcohol use: Not Currently    Comment: occasionally   Drug use: No   Sexual activity: Yes    Birth control/protection: Surgical  Other Topics Concern   Not on file  Social History Narrative   Patient lives in [place] with her 2 kids- 22 in 60 years old.   She has total 4 kids.   She is a single mom.  Social Determinants of Health   Financial Resource Strain: High Risk (09/30/2022)   Overall Financial Resource Strain (CARDIA)    Difficulty of Paying Living Expenses: Hard  Food Insecurity: No Food Insecurity (02/12/2023)   Hunger Vital Sign    Worried About Running Out of Food in the Last Year: Never true    Ran Out of Food in the Last Year: Never true  Recent Concern: Food Insecurity - Food Insecurity Present (02/07/2023)   Hunger Vital Sign    Worried About Running Out of Food in the Last Year: Sometimes true    Ran Out of Food in the Last Year: Sometimes true  Transportation Needs: No Transportation Needs (02/12/2023)   PRAPARE - Administrator, Civil Service (Medical): No    Lack of Transportation (Non-Medical): No  Recent Concern:  Transportation Needs - Unmet Transportation Needs (02/06/2023)   PRAPARE - Transportation    Lack of Transportation (Medical): Yes    Lack of Transportation (Non-Medical): Yes  Physical Activity: Not on file  Stress: No Stress Concern Present (01/07/2022)   Received from [facility], [facility]   [employer] [facility] - Occupational Stress Questionnaire    Feeling of Stress : Not at all  Social Connections: Unknown (10/19/2021)   Received from [facility], [facility]   Social Network    Social Network: Not on file   Sleep: Poor  Appetite:  Poor  Current Medications: Current Facility-Administered Medications  Medication Dose Route Frequency Provider Last Rate Last Admin   acetaminophen (TYLENOL) tablet 650 mg  650 mg Oral Q6H PRN Maryagnes Amos, FNP   650 mg at 02/13/23 2137   alum & mag hydroxide-simeth (MAALOX/MYLANTA) 200-200-20 MG/5ML suspension 30 mL  30 mL Oral Q4H PRN Starkes-Perry, Juel Burrow, FNP       bumetanide (BUMEX) tablet 1 mg  1 mg Oral Daily Arnetha Courser, MD   1 mg at 02/19/23 0846   cholecalciferol (VITAMIN D3) 25 MCG (1000 UNIT) tablet 1,000 Units  1,000 Units Oral Daily Maryagnes Amos, FNP   1,000 Units at 02/19/23 0845   docusate sodium (COLACE) capsule 100 mg  100 mg Oral BID Maryagnes Amos, FNP   100 mg at 02/19/23 0845   feeding supplement (GLUCERNA SHAKE) (GLUCERNA SHAKE) liquid 237 mL  237 mL Oral TID BM Charm Rings, NP   237 mL at 02/19/23 0851   haloperidol (HALDOL) tablet 5 mg  5 mg Oral TID PRN Maryagnes Amos, FNP       Or   haloperidol lactate (HALDOL) injection 5 mg  5 mg Intramuscular TID PRN Maryagnes Amos, FNP       hydrOXYzine (ATARAX) tablet 50 mg  50 mg Oral TID PRN Lauree Chandler, NP       influenza vac split trivalent PF (FLULAVAL) injection 0.5 mL  0.5 mL Intramuscular Tomorrow-1000 Marval Regal, Meenakshi, MD       insulin aspart (novoLOG) injection 0-15 Units  0-15 Units  Subcutaneous TID WC Maryagnes Amos, FNP   2 Units at 02/19/23 1201   insulin aspart (novoLOG) injection 0-5 Units  0-5 Units Subcutaneous QHS Maryagnes Amos, FNP   4 Units at 02/14/23 2129   insulin glargine-yfgn (SEMGLEE) injection 15 Units  15 Units Subcutaneous Daily Emeline General, MD   15 Units at 02/19/23 0845   ipratropium-albuterol (DUONEB) 0.5-2.5 (3) MG/3ML nebulizer solution 3 mL  3 mL Nebulization Q6H PRN Rosario Adie, Juel Burrow, FNP  lidocaine (XYLOCAINE) 5 % ointment   Topical QID PRN Starkes-Perry, Juel Burrow, FNP       LORazepam (ATIVAN) tablet 2 mg  2 mg Oral TID PRN Maryagnes Amos, FNP       Or   LORazepam (ATIVAN) injection 2 mg  2 mg Intramuscular TID PRN Rosario Adie, Juel Burrow, FNP       LORazepam (ATIVAN) tablet 0.5 mg  0.5 mg Oral Daily PRN Charm Rings, NP       lurasidone (LATUDA) tablet 40 mg  40 mg Oral Q supper Lauree Chandler, NP       magnesium hydroxide (MILK OF MAGNESIA) suspension 30 mL  30 mL Oral Daily PRN Starkes-Perry, Juel Burrow, FNP       midodrine (PROAMATINE) tablet 5 mg  5 mg Oral TID WC Maryagnes Amos, FNP   5 mg at 02/19/23 1201   mometasone-formoterol (DULERA) 200-5 MCG/ACT inhaler 2 puff  2 puff Inhalation BID Maryagnes Amos, FNP   2 puff at 02/19/23 0845   nicotine polacrilex (NICORETTE) gum 2 mg  2 mg Oral PRN Maryagnes Amos, FNP   2 mg at 02/14/23 2134   oxyCODONE (Oxy IR/ROXICODONE) immediate release tablet 10 mg  10 mg Oral Q6H PRN Maryagnes Amos, FNP   10 mg at 02/19/23 0931   pneumococcal 20-valent conjugate vaccine (PREVNAR 20) injection 0.5 mL  0.5 mL Intramuscular Tomorrow-1000 Lewanda Rife, MD       polyethylene glycol (MIRALAX / GLYCOLAX) packet 17 g  17 g Oral Daily Starkes-Perry, Takia S, FNP       rosuvastatin (CRESTOR) tablet 20 mg  20 mg Oral Daily Maryagnes Amos, FNP   20 mg at 02/19/23 0845   senna (SENOKOT) tablet 8.6 mg  1 tablet Oral BID Cinderella, Margaret A    8.6 mg at 02/19/23 0846   zolpidem (AMBIEN) tablet 5 mg  5 mg Oral QHS Charm Rings, NP   5 mg at 02/18/23 2052    Lab Results:  Results for orders placed or performed during the hospital encounter of 02/12/23 (from the past 48 hour(s))  Glucose, capillary     Status: Abnormal   Collection Time: 02/17/23  4:44 PM  Result Value Ref Range   Glucose-Capillary 192 (H) 70 - 99 mg/dL    Comment: Glucose reference range applies only to samples taken after fasting for at least 8 hours.  Glucose, capillary     Status: Abnormal   Collection Time: 02/17/23  8:32 PM  Result Value Ref Range   Glucose-Capillary 161 (H) 70 - 99 mg/dL    Comment: Glucose reference range applies only to samples taken after fasting for at least 8 hours.  Glucose, capillary     Status: Abnormal   Collection Time: 02/18/23  6:51 AM  Result Value Ref Range   Glucose-Capillary 129 (H) 70 - 99 mg/dL    Comment: Glucose reference range applies only to samples taken after fasting for at least 8 hours.  Glucose, capillary     Status: Abnormal   Collection Time: 02/18/23  9:02 AM  Result Value Ref Range   Glucose-Capillary 123 (H) 70 - 99 mg/dL    Comment: Glucose reference range applies only to samples taken after fasting for at least 8 hours.  Glucose, capillary     Status: Abnormal   Collection Time: 02/18/23 11:36 AM  Result Value Ref Range   Glucose-Capillary 161 (H) 70 - 99 mg/dL  Comment: Glucose reference range applies only to samples taken after fasting for at least 8 hours.  Glucose, capillary     Status: Abnormal   Collection Time: 02/18/23  5:28 PM  Result Value Ref Range   Glucose-Capillary 174 (H) 70 - 99 mg/dL    Comment: Glucose reference range applies only to samples taken after fasting for at least 8 hours.  Glucose, capillary     Status: Abnormal   Collection Time: 02/18/23  8:54 PM  Result Value Ref Range   Glucose-Capillary 168 (H) 70 - 99 mg/dL    Comment: Glucose reference range applies  only to samples taken after fasting for at least 8 hours.  Glucose, capillary     Status: Abnormal   Collection Time: 02/19/23  6:52 AM  Result Value Ref Range   Glucose-Capillary 109 (H) 70 - 99 mg/dL    Comment: Glucose reference range applies only to samples taken after fasting for at least 8 hours.  Glucose, capillary     Status: Abnormal   Collection Time: 02/19/23 11:44 AM  Result Value Ref Range   Glucose-Capillary 129 (H) 70 - 99 mg/dL    Comment: Glucose reference range applies only to samples taken after fasting for at least 8 hours.    Blood Alcohol level:  Lab Results  Component Value Date   ETH <10 08/14/2022   ETH <10 07/30/2020    Metabolic Disorder Labs: Lab Results  Component Value Date   HGBA1C 8.4 (H) 02/07/2023   MPG 194.38 02/07/2023   MPG 269 08/27/2022   Lab Results  Component Value Date   PROLACTIN 10.6 08/31/2015   Lab Results  Component Value Date   CHOL 196 02/14/2023   TRIG 77 02/14/2023   HDL 47 02/14/2023   CHOLHDL 4.2 02/14/2023   VLDL 15 02/14/2023   LDLCALC 134 (H) 02/14/2023   LDLCALC 79 08/27/2022    Physical Findings: AIMS:  , ,  ,  ,    CIWA:    COWS:     Musculoskeletal: Strength & Muscle Tone: within normal limits Gait & Station: normal Patient leans: N/A  Psychiatric Specialty Exam:  Presentation  General Appearance:  Appropriate for Environment  Eye Contact: Fair  Speech: Clear and Coherent; Normal Rate  Speech Volume: Normal  Handedness: Right   Mood and Affect  Mood: -- ("little depressed")  Affect: Flat   Thought Process  Thought Processes: Coherent; Goal Directed; Linear  Descriptions of Associations:Intact  Orientation:Full (Time, Place and Person)  Thought Content:WDL  Hallucinations:Hallucinations: None  Ideas of Reference:None  Suicidal Thoughts:Suicidal Thoughts: No  Homicidal Thoughts:Homicidal Thoughts: No   Sensorium  Memory: Immediate Good; Recent Good; Remote  Good  Judgment: Intact  Insight: Shallow   Executive Functions  Concentration: Good  Attention Span: Good  Recall: Good  Fund of Knowledge: Good  Language: Good   Psychomotor Activity  Psychomotor Activity: Psychomotor Activity: Normal   Assets  Assets: Communication Skills; Desire for Improvement; Financial Resources/Insurance; Housing; Resilience; Social Support   Sleep  Sleep: Sleep: Poor    Physical Exam: Physical Exam Constitutional:      General: She is not in acute distress.    Appearance: She is not ill-appearing, toxic-appearing or diaphoretic.  Eyes:     General: No scleral icterus. Cardiovascular:     Rate and Rhythm: Normal rate.  Pulmonary:     Effort: Pulmonary effort is normal. No respiratory distress.  Neurological:     Mental Status: She is alert and oriented  to person, place, and time.  Psychiatric:        Attention and Perception: Attention and perception normal.        Mood and Affect: Mood is depressed. Affect is flat.        Speech: Speech normal.        Behavior: Behavior normal. Behavior is cooperative.        Thought Content: Thought content normal.        Cognition and Memory: Cognition and memory normal.    Review of Systems  Constitutional:  Negative for chills and fever.  Respiratory:  Negative for shortness of breath.   Cardiovascular:  Negative for chest pain and palpitations.  Gastrointestinal:  Negative for abdominal pain.  Neurological:  Negative for headaches.  Psychiatric/Behavioral:  Positive for depression.    Blood pressure 110/63, pulse 61, temperature 98.3 F (36.8 C), resp. rate 19, height 5\' 5"  (1.651 m), weight 73.7 kg, SpO2 96%. Body mass index is 27.04 kg/m.   Treatment Plan Summary: Daily contact with patient to assess and evaluate symptoms and progress in treatment, Medication management, and Plan    Bipolar 1 disorder -Latuda 40mg  oral daily with supper   Nicotine cessation -nicorette  2mg  oral as needed smoking cessation   Agitation PRNs -Haldol 5mg  PO or IM 3 times daily PRN agitation -Ativan 2mg  PO or IM 3 times daily PRN agitation   Anxiety -Hydroxyzine 25mg  oral 3 times daily PRN anxiety -Ativan 0.5 mg daily PRN, not to be given after 6 pm because of Ambien at bedtime   Insomnia: Ambien 5 mg daily at bedtime  Lauree Chandler, NP 02/19/2023, 1:35 PM

## 2023-02-19 NOTE — Plan of Care (Signed)
Patient rated her depression and anxiety 7/10. Patient stayed in bed whole shift except for meals and meds.Patient verbalized social anxiety when around people. Patient gets up for PRN pain medicines on time. Denies SI,HI and AVH. Appetite fair. Support and encouragement given.

## 2023-02-19 NOTE — BH IP Treatment Plan (Signed)
Interdisciplinary Treatment and Diagnostic Plan Update  02/19/2023 Time of Session: 09:00 [name] [name] MRN: [id number]  Principal Diagnosis: Bipolar I disorder, most recent episode depressed (HCC)  Secondary Diagnoses: Active Problems:   DM2 (diabetes mellitus, type 2) (HCC)   Chronic systolic CHF (congestive heart failure) (HCC)   COPD (chronic obstructive pulmonary disease) (HCC)   Prolonged QT interval   Hypotension   Current Medications:  Current Facility-Administered Medications  Medication Dose Route Frequency Provider Last Rate Last Admin   acetaminophen (TYLENOL) tablet 650 mg  650 mg Oral Q6H PRN Maryagnes Amos, FNP   650 mg at 02/13/23 2137   alum & mag hydroxide-simeth (MAALOX/MYLANTA) 200-200-20 MG/5ML suspension 30 mL  30 mL Oral Q4H PRN Starkes-Perry, Juel Burrow, FNP       bumetanide (BUMEX) tablet 1 mg  1 mg Oral Daily Arnetha Courser, MD   1 mg at 02/19/23 0846   cholecalciferol (VITAMIN D3) 25 MCG (1000 UNIT) tablet 1,000 Units  1,000 Units Oral Daily Maryagnes Amos, FNP   1,000 Units at 02/19/23 0845   docusate sodium (COLACE) capsule 100 mg  100 mg Oral BID Maryagnes Amos, FNP   100 mg at 02/19/23 0845   feeding supplement (GLUCERNA SHAKE) (GLUCERNA SHAKE) liquid 237 mL  237 mL Oral TID BM Charm Rings, NP   237 mL at 02/19/23 0851   haloperidol (HALDOL) tablet 5 mg  5 mg Oral TID PRN Maryagnes Amos, FNP       Or   haloperidol lactate (HALDOL) injection 5 mg  5 mg Intramuscular TID PRN Maryagnes Amos, FNP       hydrOXYzine (ATARAX) tablet 50 mg  50 mg Oral TID PRN Lauree Chandler, NP       influenza vac split trivalent PF (FLULAVAL) injection 0.5 mL  0.5 mL Intramuscular Tomorrow-1000 Lewanda Rife, MD       insulin aspart (novoLOG) injection 0-15 Units  0-15 Units Subcutaneous TID WC Maryagnes Amos, FNP   2 Units at 02/19/23 1201   insulin aspart (novoLOG) injection 0-5 Units  0-5 Units Subcutaneous QHS  Maryagnes Amos, FNP   4 Units at 02/14/23 2129   insulin glargine-yfgn (SEMGLEE) injection 15 Units  15 Units Subcutaneous Daily Mikey College T, MD   15 Units at 02/19/23 0845   ipratropium-albuterol (DUONEB) 0.5-2.5 (3) MG/3ML nebulizer solution 3 mL  3 mL Nebulization Q6H PRN Starkes-Perry, Juel Burrow, FNP       lidocaine (XYLOCAINE) 5 % ointment   Topical QID PRN Starkes-Perry, Juel Burrow, FNP       LORazepam (ATIVAN) tablet 2 mg  2 mg Oral TID PRN Maryagnes Amos, FNP       Or   LORazepam (ATIVAN) injection 2 mg  2 mg Intramuscular TID PRN Rosario Adie, Juel Burrow, FNP       LORazepam (ATIVAN) tablet 0.5 mg  0.5 mg Oral Daily PRN Charm Rings, NP       lurasidone (LATUDA) tablet 40 mg  40 mg Oral Q supper Lauree Chandler, NP       magnesium hydroxide (MILK OF MAGNESIA) suspension 30 mL  30 mL Oral Daily PRN Starkes-Perry, Juel Burrow, FNP       midodrine (PROAMATINE) tablet 5 mg  5 mg Oral TID WC Starkes-Perry, Juel Burrow, FNP   5 mg at 02/19/23 1201   mometasone-formoterol (DULERA) 200-5 MCG/ACT inhaler 2 puff  2 puff Inhalation BID Maryagnes Amos, FNP  2 puff at 02/19/23 0845   nicotine polacrilex (NICORETTE) gum 2 mg  2 mg Oral PRN Maryagnes Amos, FNP   2 mg at 02/14/23 2134   oxyCODONE (Oxy IR/ROXICODONE) immediate release tablet 10 mg  10 mg Oral Q6H PRN Maryagnes Amos, FNP   10 mg at 02/19/23 1525   pneumococcal 20-valent conjugate vaccine (PREVNAR 20) injection 0.5 mL  0.5 mL Intramuscular Tomorrow-1000 Lewanda Rife, MD       polyethylene glycol (MIRALAX / GLYCOLAX) packet 17 g  17 g Oral Daily Starkes-Perry, Takia S, FNP       rosuvastatin (CRESTOR) tablet 20 mg  20 mg Oral Daily Maryagnes Amos, FNP   20 mg at 02/19/23 0845   senna (SENOKOT) tablet 8.6 mg  1 tablet Oral BID Cinderella, Margaret A   8.6 mg at 02/19/23 0846   zolpidem (AMBIEN) tablet 5 mg  5 mg Oral QHS Charm Rings, NP   5 mg at 02/18/23 2052   PTA  Medications: Medications Prior to Admission  Medication Sig Dispense Refill Last Dose   acetaminophen (TYLENOL) 325 MG tablet Take 1 tablet (325 mg total) by mouth every 6 (six) hours as needed for headache or mild pain. (Patient not taking: Reported on 02/06/2023) 60 tablet 0    albuterol (VENTOLIN HFA) 108 (90 Base) MCG/ACT inhaler Inhale 2 puffs into the lungs every 6 (six) hours as needed for wheezing or shortness of breath. 18 g 2    blood glucose meter kit and supplies Dispense based on patient and insurance preference. Use up to four times daily as directed. (FOR ICD-10 E10.9, E11.9). 1 each 0    bumetanide (BUMEX) 1 MG tablet Take 1 tablet (1 mg total) by mouth 2 (two) times daily. 30 tablet 1    vitamin D3 (CHOLECALCIFEROL) 25 MCG tablet Take 1 tablet (1,000 Units total) by mouth daily. 90 tablet 1    glucose blood (ONETOUCH VERIO) test strip Use three times daily as directed to check blood sugar 100 each 0    insulin glargine, 1 Unit Dial, (TOUJEO SOLOSTAR) 300 UNIT/ML Solostar Pen Inject 28 Units into the skin at bedtime. 3 mL 11    Insulin Pen Needle 32G X 4 MM MISC Use As Directed 100 each 0    Insulin Pen Needle 32G X 4 MM MISC Use 3 (three) times daily. 100 each 0    ipratropium-albuterol (DUONEB) 0.5-2.5 (3) MG/3ML SOLN inhale 3 mLs by nebulization every 6 (six) hours as needed. 360 mL 0    Lancet Devices (ONETOUCH DELICA PLUS LANCING) MISC Use 3 (three) times daily. 1 each 0    midodrine (PROAMATINE) 5 MG tablet Take 1 tablet (5 mg total) by mouth 3 (three) times daily with meals. 90 tablet 1    mometasone-formoterol (DULERA) 200-5 MCG/ACT AERO Inhale 2 puffs into the lungs 2 (two) times daily. 13 g 3    nicotine polacrilex (NICORETTE) 2 MG gum Take 1 each (2 mg total) by mouth as needed for smoking cessation. 100 tablet 0    nitroGLYCERIN (NITROSTAT) 0.4 MG SL tablet Place 1 tablet (0.4 mg total) under the tongue every 5 (five) minutes as needed for chest pain. 50 tablet 0     OneTouch Delica Lancets 33G MISC Use 3 (three) times daily as directed to check blood sugar 100 each 0    oxyCODONE-acetaminophen (PERCOCET) 10-325 MG tablet Take 1 tablet by mouth every 6 (six) hours as needed for pain. 15 tablet 0  potassium chloride (KLOR-CON M) 10 MEQ tablet Take 1 tablet (10 mEq total) by mouth daily. (Patient not taking: Reported on 02/06/2023) 30 tablet 8    risperiDONE (RISPERDAL) 2 MG tablet Take 1/2 tablet in the morning and one tablet in the evening. 45 tablet 3    rosuvastatin (CRESTOR) 20 MG tablet Take 1 tablet (20 mg total) by mouth daily. 30 tablet 1     Patient Stressors:    Patient Strengths:    Treatment Modalities: Medication Management, Group therapy, Case management,  1 to 1 session with clinician, Psychoeducation, Recreational therapy.   Physician Treatment Plan for Primary Diagnosis: Bipolar I disorder, most recent episode depressed (HCC) Long Term Goal(s): Improvement in symptoms so as ready for discharge   Short Term Goals: Ability to identify changes in lifestyle to reduce recurrence of condition will improve Ability to verbalize feelings will improve Ability to disclose and discuss suicidal ideas Ability to demonstrate self-control will improve Ability to identify and develop effective coping behaviors will improve Ability to identify triggers associated with substance abuse/mental health issues will improve  Medication Management: Evaluate patient's response, side effects, and tolerance of medication regimen.  Therapeutic Interventions: 1 to 1 sessions, Unit Group sessions and Medication administration.  Evaluation of Outcomes: Not Progressing  Physician Treatment Plan for Secondary Diagnosis: Active Problems:   DM2 (diabetes mellitus, type 2) (HCC)   Chronic systolic CHF (congestive heart failure) (HCC)   COPD (chronic obstructive pulmonary disease) (HCC)   Prolonged QT interval   Hypotension  Long Term Goal(s): Improvement in  symptoms so as ready for discharge   Short Term Goals: Ability to identify changes in lifestyle to reduce recurrence of condition will improve Ability to verbalize feelings will improve Ability to disclose and discuss suicidal ideas Ability to demonstrate self-control will improve Ability to identify and develop effective coping behaviors will improve Ability to identify triggers associated with substance abuse/mental health issues will improve     Medication Management: Evaluate patient's response, side effects, and tolerance of medication regimen.  Therapeutic Interventions: 1 to 1 sessions, Unit Group sessions and Medication administration.  Evaluation of Outcomes: Not Progressing   RN Treatment Plan for Primary Diagnosis: Bipolar I disorder, most recent episode depressed (HCC) Long Term Goal(s): Knowledge of disease and therapeutic regimen to maintain health will improve  Short Term Goals: Ability to remain free from injury will improve, Ability to verbalize frustration and anger appropriately will improve, Ability to demonstrate self-control, Ability to participate in decision making will improve, Ability to verbalize feelings will improve, Ability to disclose and discuss suicidal ideas, Ability to identify and develop effective coping behaviors will improve, and Compliance with prescribed medications will improve  Medication Management: RN will administer medications as ordered by provider, will assess and evaluate patient's response and provide education to patient for prescribed medication. RN will report any adverse and/or side effects to prescribing provider.  Therapeutic Interventions: 1 on 1 counseling sessions, Psychoeducation, Medication administration, Evaluate responses to treatment, Monitor vital signs and CBGs as ordered, Perform/monitor CIWA, COWS, AIMS and Fall Risk screenings as ordered, Perform wound care treatments as ordered.  Evaluation of Outcomes: Not  Progressing   LCSW Treatment Plan for Primary Diagnosis: Bipolar I disorder, most recent episode depressed (HCC) Long Term Goal(s): Safe transition to appropriate next level of care at discharge, Engage patient in therapeutic group addressing interpersonal concerns.  Short Term Goals: Engage patient in aftercare planning with referrals and resources, Increase social support, Increase ability to appropriately verbalize feelings,  Increase emotional regulation, Facilitate acceptance of mental health diagnosis and concerns, and Increase skills for wellness and recovery  Therapeutic Interventions: Assess for all discharge needs, 1 to 1 time with Social worker, Explore available resources and support systems, Assess for adequacy in community support network, Educate family and significant other(s) on suicide prevention, Complete Psychosocial Assessment, Interpersonal group therapy.  Evaluation of Outcomes: Not Progressing   Progress in Treatment: Attending groups: No. Participating in groups: No. Taking medication as prescribed: Yes. Toleration medication: Yes. Family/Significant other contact made: Yes, individual(s) contacted:  daughter and sister have been contacted.  Patient understands diagnosis: Yes. Discussing patient identified problems/goals with staff: Yes. Medical problems stabilized or resolved: Yes. Denies suicidal/homicidal ideation: No. Issues/concerns per patient self-inventory: No. Other: none  New problem(s) identified: No, Describe:  none identified.   New Short Term/Long Term Goal(s): elimination of symptoms of psychosis, medication management for mood stabilization; elimination of SI thoughts; development of comprehensive mental wellness plan. Update 02/19/23: No changes at this time.      Patient Goals:  "Just get on my meds and get more balanced." Update 02/19/23: No changes at this time.    Discharge Plan or Barriers: CSW will assist pt with development of an  appropriate aftercare/discharge plan. Update 02/19/23: Pt has shared different stories with different people. Pt told provider that she has three firearms in her home. Then told the CSW that she has firearms in her home and still maintains that she plans to shoot herself on 02/18/23. CSW team reached out to family members (sister and daughter) who both denied knowing of pt having any weapons in the home. Today when approached by the CSW, pt denied ever telling anyone that she had any weapons.    Reason for Continuation of Hospitalization: Depression Mania Medication stabilization Suicidal ideation   Estimated Length of Stay: 1-7 days Update 02/19/23: No changes at this time.   Last 3 [place] Suicide Severity Risk Score: Flowsheet Row Admission (Current) from 02/12/2023 in [facility] INPATIENT BEH[facility]INE ED to Hosp-Admission (Discharged) from 02/06/2023 in [facility] Progressive [facility] ED from 12/14/2022 in [facility] Emergency Department at [facility]  C-SSRS RISK CATEGORY No Risk No Risk No Risk       Last PHQ 2/9 Scores:    09/30/2022   11:54 AM 07/30/2020    6:34 AM 06/07/2020   12:28 PM  Depression screen PHQ 2/9  Decreased Interest 3 0 0  Down, Depressed, Hopeless 2 1 0  PHQ - 2 Score 5 1 0  Altered sleeping 3 0   Tired, decreased energy 3 3   Change in appetite 1 0   Feeling bad or failure about yourself  1 0   Trouble concentrating 3 3   Moving slowly or fidgety/restless 3 1   Suicidal thoughts 0 0   PHQ-9 Score 19 8   Difficult doing work/chores Very difficult Somewhat difficult     Scribe for Treatment Team: Glenis Smoker, LCSW 02/19/2023 4:07 PM

## 2023-02-19 NOTE — Group Note (Signed)
Date:  02/19/2023 Time:  5:26 PM  Group Topic/Focus:  Outdoor Recreation    Participation Level:  Did Not Attend   Lynelle Smoke [facility] 02/19/2023, 5:26 PM

## 2023-02-19 NOTE — Group Note (Signed)
Date:  02/19/2023 Time:  8:43 PM  Group Topic/Focus:  Goals Group:   The focus of this group is to help patients establish daily goals to achieve during treatment and discuss how the patient can incorporate goal setting into their daily lives to aide in recovery.    Participation Level:  Active  Participation Quality:  Appropriate and Attentive  Affect:  Appropriate  Cognitive:  Alert and Appropriate  Insight: Appropriate and Good  Engagement in Group:  Developing/Improving  Modes of Intervention:  Discussion, Rapport Building, Socialization, and Support  Additional Comments:     [name] [name] 02/19/2023, 8:43 PM

## 2023-02-19 NOTE — BHH Counselor (Addendum)
ADDENDUM CSW notes that the number for [place] was written incorrectly, correct number is [phone].  CSW spoke with patient's daughter who reports that to her knowledge the patient does not have weapons.  She reports "I'm pretty sure that she doesn't have them."  She later stated that "I don't think she has enough willpower to do something like that."  Daughter also stated "if she's telling y;all that she must not want to come home".  Daughter reports that she does NOT live with the patient.  She reports that she is "somewhat of a support for her but probably not as much as she would like".   Penni Homans, MSW, LCSW 02/19/2023 3:03 PM    CSW staffed with [facility] Supervisor Verner Chol.  Per Verner Chol the patient could surrender her weapons to the police, otherwise having the police removing the weapons is a violation of her constitutional rights.   TOC Supervisor also reports that CSW could contact the family member listed on the patient's IVC paperwork for collateral contact on the weapons.   CSW notes that patient was [facility] by hospital.  However, an alternative number was listed for patient's daughter who was listed as living with the patient.  CSW attempted to call Forestine Na at [phone].  CSW left HIPAA compliant voicemail requesting a return call.  CSW will follow up.   Penni Homans, MSW, LCSW 02/19/2023 2:37 PM

## 2023-02-19 NOTE — Group Note (Signed)
Recreation Therapy Group Note   Group Topic:Healthy Support Systems  Group Date: 02/19/2023 Start Time: 1000 End Time: 1100 Facilitators: Rosina Lowenstein, LRT, CTRS Location:  Craft Room  Group Description: Straw Bridge.  Patients were given 10 plastic drinking straws and an equal length of masking tape. Using the materials provided, patients were instructed to build a free-standing bridge-like structure to suspend an everyday item (ex: deck of cards) off the floor or table surface. All materials were required to be used in Secondary school teacher. LRT facilitated post-activity discussion reviewing the importance of having strong and healthy support systems in our lives. LRT discussed how the people in our lives serve as the tape and the deck of cards we placed on top of our straw structure are the stressors we face in daily life. LRT and pts discussed what happens in our life when things get too heavy for [place], and we don't have strong supports outside of the hospital. Pt shared 2 of their healthy supports in their life aloud in the group.    Goal Area(s) Addressed:  Patient will identify 2 healthy supports in their life. Patient will identify skills to successfully complete activity. Patient will identify correlation of this activity to life post-discharge.  Patient will work on Product manager.   Affect/Mood: N/A   Participation Level: Did not attend    Clinical Observations/Individualized Feedback: Zahli did not attend group.  Plan: Continue to engage patient in RT group sessions 2-3x/week.   Rosina Lowenstein, LRT, CTRS 02/19/2023 11:54 AM

## 2023-02-20 ENCOUNTER — Encounter (HOSPITAL_BASED_OUTPATIENT_CLINIC_OR_DEPARTMENT_OTHER): Payer: Self-pay

## 2023-02-20 DIAGNOSIS — F3130 Bipolar disorder, current episode depressed, mild or moderate severity, unspecified: Secondary | ICD-10-CM | POA: Diagnosis not present

## 2023-02-20 LAB — GLUCOSE, CAPILLARY
Glucose-Capillary: 98 mg/dL (ref 70–99)
Glucose-Capillary: 85 mg/dL (ref 70–99)
Glucose-Capillary: 101 mg/dL — ABNORMAL HIGH (ref 70–99)
Glucose-Capillary: 126 mg/dL — ABNORMAL HIGH (ref 70–99)
Glucose-Capillary: 158 mg/dL — ABNORMAL HIGH (ref 70–99)

## 2023-02-20 NOTE — Group Note (Signed)
[facility] LCSW Group Therapy Note   Group Date: 02/20/2023 Start Time: 1315 End Time: 1415   Type of Therapy/Topic:  Group Therapy:  Balance in Life  Participation Level:  Did Not Attend   Description of Group:    This group will address the concept of balance and how it feels and looks when one is unbalanced. Patients will be encouraged to process areas in their lives that are out of balance, and identify reasons for remaining unbalanced. Facilitators will guide patients utilizing problem- solving interventions to address and correct the stressor making their life unbalanced. Understanding and applying boundaries will be explored and addressed for obtaining  and maintaining a balanced life. Patients will be encouraged to explore ways to assertively make their unbalanced needs known to significant others in their lives, using other group members and facilitator for support and feedback.  Therapeutic Goals: Patient will identify two or more emotions or situations they have that consume much of in their lives. Patient will identify signs/triggers that life has become out of balance:  Patient will identify two ways to set boundaries in order to achieve balance in their lives:  Patient will demonstrate ability to communicate their needs through discussion and/or role plays  Summary of Patient Progress: X   Therapeutic Modalities:   Cognitive Behavioral Therapy Solution-Focused Therapy Assertiveness Training   Glenis Smoker, LCSW

## 2023-02-20 NOTE — Group Note (Signed)
Recreation Therapy Group Note   Group Topic:Relaxation  Group Date: 02/20/2023 Start Time: 1000 End Time: 1050 Facilitators: Rosina Lowenstein, LRT, CTRS Location:  Craft Room  Group Description: PMR (Progressive Muscle Relaxation). LRT asks patients their current level of stress/anxiety from 1-10, with 10 being the highest. LRT educates patients on what PMR is and the benefits that come from it. Patients are asked to sit with their feet flat on the floor while sitting up and all the way back in their chair, if possible. LRT and pts follow a prompt through a speaker that requires you to tense and release different muscles in their body and focus on their breathing. During session, lights are off and soft music is being played. Pts are given a stress ball to use if needed. At the end of the prompt, LRT asks patients to rank their current levels of stress/anxiety from 1-10, 10 being the highest.   Goal Area(s) Addressed:  Patients will be able to describe progressive muscle relaxation.  Patient will practice using relaxation technique. Patient will identify a new coping skill.  Patient will follow multistep directions to reduce anxiety and stress.   Affect/Mood: N/A   Participation Level: Did not attend    Clinical Observations/Individualized Feedback: Adilee did not attend group.  Plan: Continue to engage patient in RT group sessions 2-3x/week.   Rosina Lowenstein, LRT, CTRS 02/20/2023 11:11 AM

## 2023-02-20 NOTE — Progress Notes (Signed)
Patient was cooperative on the shift, she remains sad and depressed, she spent most of the shift resting in bed, she was compliant with medication regime on the shift. She denies SI, HI & AVH.

## 2023-02-20 NOTE — Group Note (Signed)
Date:  02/20/2023 Time:  10:34 PM  Group Topic/Focus:  Overcoming Stress:   The focus of this group is to define stress and help patients assess their triggers.    Participation Level:  Active  Participation Quality:  Appropriate and Attentive  Affect:  Appropriate  Cognitive:  Alert and Appropriate  Insight: Appropriate and Good  Engagement in Group:  Developing/Improving  Modes of Intervention:  Discussion, Education, and Limit-setting  Additional Comments:     [name] [name] 02/20/2023, 10:34 PM

## 2023-02-20 NOTE — Progress Notes (Signed)
Patient pleasant and cooperative with care. Medication compliant. Appropriate with staff and peers. Isolative to self and room mainly. Complaints of pain, constant chronic. PRN given with some relief. Denies SI, HI, AVH.  Encouragement and support provided. Safety checks maintained. Medications given as prescribed. Pt receptive and remains safe on unit with q 15 min checks.

## 2023-02-20 NOTE — Progress Notes (Signed)
[facility] MD Progress Note  02/20/2023 12:22 PM [name] [name]  MRN:  [id number]  Subjective:  Pt chart reviewed, discussed with interdisciplinary team, and seen on rounds. Pt reports feeling "real tired" today. Endorses passive suicidal ideations "I just don't want to be here". Denies homicidal ideations. Denies auditory visual hallucinations or paranoia.  Has made minimal efforts to engage in treatment plan. Her latuda was increased yesterday. Encouraged pt to be engaged in her treatment plan and to attend groups. She verbalized understanding.   Pt has previously reported to this Clinical research associate that she has access to firearms. Yesterday and today she reports she never made this statement. Per documentation, she also made this statement to Dr. Jodie Echevaria on 02/12/23, prior to her admission, where he notes "She reportedly has access to three loaded firearms (I could not confirm this information from collateral calls) but fortunately has not made any gestures". Possible that these statements were made for secondary gain purposes. Appreciate social work assistance in obtaining collateral. Per social work, pt's daughter does not know of pt having weapons, pt's sister has reported she does not believe that pt has weapons.   Principal Problem: Bipolar I disorder, most recent episode depressed (HCC) Diagnosis: Active Problems:   DM2 (diabetes mellitus, type 2) (HCC)   Chronic systolic CHF (congestive heart failure) (HCC)   COPD (chronic obstructive pulmonary disease) (HCC)   Prolonged QT interval   Hypotension  Total Time spent with patient:  25 minutes  Past Psychiatric History: bipolar d/o  Past Medical History:  Past Medical History:  Diagnosis Date   Anxiety disorder    Asthma    Bipolar affective disorder (HCC)    Blood transfusion without reported diagnosis    CHF (congestive heart failure) (HCC)    COPD (chronic obstructive pulmonary disease) (HCC)    Depression    Diabetes mellitus    Migraine     Past  Surgical History:  Procedure Laterality Date   KNEE ARTHROSCOPY     x 2   KNEE SURGERY Left    Incision made and knee cleaned out   TUBAL LIGATION     Family History:  Family History  Problem Relation Age of Onset   Bipolar disorder Mother    Hypertension Father    Diabetes Father    Family Psychiatric  History: see above Social History:  Social History   Substance and Sexual Activity  Alcohol Use Not Currently   Comment: occasionally     Social History   Substance and Sexual Activity  Drug Use No    Social History   Socioeconomic History   Marital status: Divorced    Spouse name: Not on file   Number of children: Not on file   Years of education: Not on file   Highest education level: Not on file  Occupational History   Not on file  Tobacco Use   Smoking status: Every Day    Current packs/day: 1.00    Average packs/day: 1 pack/day for 29.0 years (29.0 ttl pk-yrs)    Types: Cigarettes   Smokeless tobacco: Never  Vaping Use   Vaping status: Former  Substance and Sexual Activity   Alcohol use: Not Currently    Comment: occasionally   Drug use: No   Sexual activity: Yes    Birth control/protection: Surgical  Other Topics Concern   Not on file  Social History Narrative   Patient lives in [place] with her 2 kids- 57 in 65 years old.   She  has total 4 kids.   She is a single mom.         Social Determinants of Health   Financial Resource Strain: High Risk (09/30/2022)   Overall Financial Resource Strain (CARDIA)    Difficulty of Paying Living Expenses: Hard  Food Insecurity: No Food Insecurity (02/12/2023)   Hunger Vital Sign    Worried About Running Out of Food in the Last Year: Never true    Ran Out of Food in the Last Year: Never true  Recent Concern: Food Insecurity - Food Insecurity Present (02/07/2023)   Hunger Vital Sign    Worried About Running Out of Food in the Last Year: Sometimes true    Ran Out of Food in the Last Year: Sometimes true   Transportation Needs: No Transportation Needs (02/12/2023)   PRAPARE - Administrator, Civil Service (Medical): No    Lack of Transportation (Non-Medical): No  Recent Concern: Transportation Needs - Unmet Transportation Needs (02/06/2023)   PRAPARE - Transportation    Lack of Transportation (Medical): Yes    Lack of Transportation (Non-Medical): Yes  Physical Activity: Not on file  Stress: No Stress Concern Present (01/07/2022)   Received from [facility], [facility]   [employer] [facility]ational [facility] - Occupational Stress Questionnaire    Feeling of Stress : Not at all  Social Connections: Unknown (10/19/2021)   Received from [facility], [facility]   Social Network    Social Network: Not on file   Additional Social History:                         Sleep: Poor  Appetite:  Poor  Current Medications: Current Facility-Administered Medications  Medication Dose Route Frequency Provider Last Rate Last Admin   acetaminophen (TYLENOL) tablet 650 mg  650 mg Oral Q6H PRN Maryagnes Amos, FNP   650 mg at 02/13/23 2137   alum & mag hydroxide-simeth (MAALOX/MYLANTA) 200-200-20 MG/5ML suspension 30 mL  30 mL Oral Q4H PRN Starkes-Perry, Juel Burrow, FNP       bumetanide (BUMEX) tablet 1 mg  1 mg Oral Daily Arnetha Courser, MD   1 mg at 02/20/23 0745   cholecalciferol (VITAMIN D3) 25 MCG (1000 UNIT) tablet 1,000 Units  1,000 Units Oral Daily Maryagnes Amos, FNP   1,000 Units at 02/20/23 0940   docusate sodium (COLACE) capsule 100 mg  100 mg Oral BID Maryagnes Amos, FNP   100 mg at 02/20/23 0939   feeding supplement (GLUCERNA SHAKE) (GLUCERNA SHAKE) liquid 237 mL  237 mL Oral TID BM Charm Rings, NP   237 mL at 02/20/23 0751   haloperidol (HALDOL) tablet 5 mg  5 mg Oral TID PRN Maryagnes Amos, FNP       Or   haloperidol lactate (HALDOL) injection 5 mg  5 mg Intramuscular TID PRN Maryagnes Amos, FNP       hydrOXYzine (ATARAX)  tablet 50 mg  50 mg Oral TID PRN Lauree Chandler, NP   50 mg at 02/19/23 1716   influenza vac split trivalent PF (FLULAVAL) injection 0.5 mL  0.5 mL Intramuscular Tomorrow-1000 Lewanda Rife, MD       insulin aspart (novoLOG) injection 0-15 Units  0-15 Units Subcutaneous TID WC Maryagnes Amos, FNP   2 Units at 02/19/23 1720   insulin aspart (novoLOG) injection 0-5 Units  0-5 Units Subcutaneous QHS Maryagnes Amos, FNP   4 Units  at 02/14/23 2129   insulin glargine-yfgn (SEMGLEE) injection 15 Units  15 Units Subcutaneous Daily Mikey College T, MD   15 Units at 02/20/23 0935   ipratropium-albuterol (DUONEB) 0.5-2.5 (3) MG/3ML nebulizer solution 3 mL  3 mL Nebulization Q6H PRN Starkes-Perry, Juel Burrow, FNP       lidocaine (XYLOCAINE) 5 % ointment   Topical QID PRN Starkes-Perry, Juel Burrow, FNP       LORazepam (ATIVAN) tablet 2 mg  2 mg Oral TID PRN Maryagnes Amos, FNP       Or   LORazepam (ATIVAN) injection 2 mg  2 mg Intramuscular TID PRN Maryagnes Amos, FNP       LORazepam (ATIVAN) tablet 0.5 mg  0.5 mg Oral Daily PRN Charm Rings, NP       lurasidone (LATUDA) tablet 40 mg  40 mg Oral Q supper Lauree Chandler, NP   40 mg at 02/19/23 1718   magnesium hydroxide (MILK OF MAGNESIA) suspension 30 mL  30 mL Oral Daily PRN Maryagnes Amos, FNP       midodrine (PROAMATINE) tablet 5 mg  5 mg Oral TID WC Maryagnes Amos, FNP   5 mg at 02/20/23 0746   mometasone-formoterol (DULERA) 200-5 MCG/ACT inhaler 2 puff  2 puff Inhalation BID Maryagnes Amos, FNP   2 puff at 02/20/23 0745   nicotine polacrilex (NICORETTE) gum 2 mg  2 mg Oral PRN Maryagnes Amos, FNP   2 mg at 02/14/23 2134   oxyCODONE (Oxy IR/ROXICODONE) immediate release tablet 10 mg  10 mg Oral Q6H PRN Maryagnes Amos, FNP   10 mg at 02/20/23 0941   pneumococcal 20-valent conjugate vaccine (PREVNAR 20) injection 0.5 mL  0.5 mL Intramuscular Tomorrow-1000 Lewanda Rife, MD        polyethylene glycol (MIRALAX / GLYCOLAX) packet 17 g  17 g Oral Daily Starkes-Perry, Takia S, FNP       rosuvastatin (CRESTOR) tablet 20 mg  20 mg Oral Daily Maryagnes Amos, FNP   20 mg at 02/20/23 1696   senna (SENOKOT) tablet 8.6 mg  1 tablet Oral BID Cinderella, Margaret A   8.6 mg at 02/20/23 0939   zolpidem (AMBIEN) tablet 5 mg  5 mg Oral QHS Charm Rings, NP   5 mg at 02/19/23 2113    Lab Results:  Results for orders placed or performed during the hospital encounter of 02/12/23 (from the past 48 hour(s))  Glucose, capillary     Status: Abnormal   Collection Time: 02/18/23  5:28 PM  Result Value Ref Range   Glucose-Capillary 174 (H) 70 - 99 mg/dL    Comment: Glucose reference range applies only to samples taken after fasting for at least 8 hours.  Glucose, capillary     Status: Abnormal   Collection Time: 02/18/23  8:54 PM  Result Value Ref Range   Glucose-Capillary 168 (H) 70 - 99 mg/dL    Comment: Glucose reference range applies only to samples taken after fasting for at least 8 hours.  Glucose, capillary     Status: Abnormal   Collection Time: 02/19/23  6:52 AM  Result Value Ref Range   Glucose-Capillary 109 (H) 70 - 99 mg/dL    Comment: Glucose reference range applies only to samples taken after fasting for at least 8 hours.  Glucose, capillary     Status: Abnormal   Collection Time: 02/19/23 11:44 AM  Result Value Ref Range   Glucose-Capillary 129 (H)  70 - 99 mg/dL    Comment: Glucose reference range applies only to samples taken after fasting for at least 8 hours.  Glucose, capillary     Status: Abnormal   Collection Time: 02/19/23  4:53 PM  Result Value Ref Range   Glucose-Capillary 136 (H) 70 - 99 mg/dL    Comment: Glucose reference range applies only to samples taken after fasting for at least 8 hours.  Glucose, capillary     Status: Abnormal   Collection Time: 02/19/23  9:12 PM  Result Value Ref Range   Glucose-Capillary 183 (H) 70 - 99 mg/dL     Comment: Glucose reference range applies only to samples taken after fasting for at least 8 hours.  Glucose, capillary     Status: None   Collection Time: 02/20/23  6:45 AM  Result Value Ref Range   Glucose-Capillary 98 70 - 99 mg/dL    Comment: Glucose reference range applies only to samples taken after fasting for at least 8 hours.  Glucose, capillary     Status: None   Collection Time: 02/20/23  7:42 AM  Result Value Ref Range   Glucose-Capillary 85 70 - 99 mg/dL    Comment: Glucose reference range applies only to samples taken after fasting for at least 8 hours.  Glucose, capillary     Status: Abnormal   Collection Time: 02/20/23  9:32 AM  Result Value Ref Range   Glucose-Capillary 101 (H) 70 - 99 mg/dL    Comment: Glucose reference range applies only to samples taken after fasting for at least 8 hours.    Blood Alcohol level:  Lab Results  Component Value Date   ETH <10 08/14/2022   ETH <10 07/30/2020    Metabolic Disorder Labs: Lab Results  Component Value Date   HGBA1C 8.4 (H) 02/07/2023   MPG 194.38 02/07/2023   MPG 269 08/27/2022   Lab Results  Component Value Date   PROLACTIN 10.6 08/31/2015   Lab Results  Component Value Date   CHOL 196 02/14/2023   TRIG 77 02/14/2023   HDL 47 02/14/2023   CHOLHDL 4.2 02/14/2023   VLDL 15 02/14/2023   LDLCALC 134 (H) 02/14/2023   LDLCALC 79 08/27/2022    Physical Findings: AIMS:  , ,  ,  ,    CIWA:    COWS:     Musculoskeletal: Strength & Muscle Tone: within normal limits Gait & Station: normal Patient leans: N/A  Psychiatric Specialty Exam:  Presentation  General Appearance:  Disheveled  Eye Contact: Fair  Speech: Clear and Coherent; Normal Rate  Speech Volume: Normal  Handedness: Right   Mood and Affect  Mood: -- ("real tired")  Affect: Flat   Thought Process  Thought Processes: Coherent; Goal Directed; Linear  Descriptions of Associations:Intact  Orientation:Full (Time, Place and  Person)  Thought Content:WDL  History of Schizophrenia/Schizoaffective disorder:No data recorded Duration of Psychotic Symptoms:No data recorded Hallucinations:Hallucinations: None  Ideas of Reference:None  Suicidal Thoughts:Suicidal Thoughts: Yes, Passive  Homicidal Thoughts:Homicidal Thoughts: No   Sensorium  Memory: Immediate Good; Recent Good; Remote Good  Judgment: Intact  Insight: Shallow   Executive Functions  Concentration: Good  Attention Span: Good  Recall: Good  Fund of Knowledge: Good  Language: Good   Psychomotor Activity  Psychomotor Activity: Psychomotor Activity: Normal   Assets  Assets: Communication Skills; Desire for Improvement; Financial Resources/Insurance; Housing; Resilience   Sleep  Sleep: Sleep: Poor    Physical Exam: Physical Exam Constitutional:  General: She is not in acute distress.    Appearance: She is not ill-appearing, toxic-appearing or diaphoretic.  Eyes:     General: No scleral icterus. Cardiovascular:     Rate and Rhythm: Normal rate.  Pulmonary:     Effort: Pulmonary effort is normal. No respiratory distress.  Neurological:     Mental Status: She is alert and oriented to person, place, and time.  Psychiatric:        Attention and Perception: Attention and perception normal.        Mood and Affect: Mood normal. Affect is flat.        Speech: Speech normal.        Behavior: Behavior normal. Behavior is cooperative.        Thought Content: Thought content is not paranoid. Thought content includes suicidal ideation. Thought content does not include homicidal ideation.        Cognition and Memory: Cognition and memory normal.    Review of Systems  Constitutional:  Negative for chills and fever.  Respiratory:  Negative for shortness of breath.   Cardiovascular:  Negative for chest pain and palpitations.  Gastrointestinal:  Negative for abdominal pain.  Neurological:  Negative for headaches.   Psychiatric/Behavioral:  Positive for depression and suicidal ideas.    Blood pressure 106/62, pulse 68, temperature (!) 97.3 F (36.3 C), resp. rate 20, height 5\' 5"  (1.651 m), weight 73.7 kg, SpO2 98%. Body mass index is 27.04 kg/m.   Treatment Plan Summary: Daily contact with patient to assess and evaluate symptoms and progress in treatment, Medication management, and Plan    Bipolar 1 disorder -Latuda 40mg  oral daily with supper   Nicotine cessation -nicorette 2mg  oral as needed smoking cessation   Agitation PRNs -Haldol 5mg  PO or IM 3 times daily PRN agitation -Ativan 2mg  PO or IM 3 times daily PRN agitation   Anxiety -Hydroxyzine 25mg  oral 3 times daily PRN anxiety -Ativan 0.5 mg daily PRN, not to be given after 6 pm because of Ambien at bedtime   Insomnia: Ambien 5 mg daily at bedtime  Lauree Chandler, NP 02/20/2023, 12:22 PM

## 2023-02-20 NOTE — Group Note (Signed)
Date:  02/20/2023 Time:  10:39 AM  Group Topic/Focus:  Activity Group:  Focus of the group was to encourage activities such as coloring, playing cards or just having conversations for the benefit of mental health treatment.    Participation Level:  Did Not Attend   [name] [name] [name] [name] 02/20/2023, 10:39 AM

## 2023-02-20 NOTE — Group Note (Signed)
Date:  02/20/2023 Time:  5:25 PM  Group Topic/Focus:  Self Care:   The focus of this group is to help patients understand the importance of self-care in order to improve or restore emotional, physical, spiritual, interpersonal, and financial health.    Participation Level:  Did Not Attend  Participation Quality:    Affect:    Cognitive:    Insight:   Engagement in Group:    Modes of Intervention:    Additional Comments:    Devinn Hurwitz 02/20/2023, 5:25 PM

## 2023-02-20 NOTE — Plan of Care (Signed)
Problem: Education: Goal: Knowledge of General Education information will improve Description: Including pain rating scale, medication(s)/side effects and non-pharmacologic comfort measures Outcome: Progressing   Problem: Health Behavior/Discharge Planning: Goal: Ability to manage health-related needs will improve Outcome: Progressing   Problem: Clinical Measurements: Goal: Ability to maintain clinical measurements within normal limits will improve Outcome: Progressing Goal: Will remain free from infection Outcome: Progressing Goal: Diagnostic test results will improve Outcome: Progressing Goal: Respiratory complications will improve Outcome: Progressing Goal: Cardiovascular complication will be avoided Outcome: Progressing   Problem: Activity: Goal: Risk for activity intolerance will decrease Outcome: Progressing   Problem: Nutrition: Goal: Adequate nutrition will be maintained Outcome: Progressing   Problem: Coping: Goal: Level of anxiety will decrease Outcome: Progressing   Problem: Elimination: Goal: Will not experience complications related to bowel motility Outcome: Progressing Goal: Will not experience complications related to urinary retention Outcome: Progressing   Problem: Pain Managment: Goal: General experience of comfort will improve Outcome: Progressing   Problem: Safety: Goal: Ability to remain free from injury will improve Outcome: Progressing   Problem: Skin Integrity: Goal: Risk for impaired skin integrity will decrease Outcome: Progressing   Problem: Education: Goal: Utilization of techniques to improve thought processes will improve Outcome: Progressing Goal: Knowledge of the prescribed therapeutic regimen will improve Outcome: Progressing   Problem: Activity: Goal: Interest or engagement in leisure activities will improve Outcome: Progressing Goal: Imbalance in normal sleep/wake cycle will improve Outcome: Progressing   Problem:  Coping: Goal: Coping ability will improve Outcome: Progressing Goal: Will verbalize feelings Outcome: Progressing   Problem: Health Behavior/Discharge Planning: Goal: Ability to make decisions will improve Outcome: Progressing Goal: Compliance with therapeutic regimen will improve Outcome: Progressing   Problem: Role Relationship: Goal: Will demonstrate positive changes in social behaviors and relationships Outcome: Progressing   Problem: Safety: Goal: Ability to disclose and discuss suicidal ideas will improve Outcome: Progressing Goal: Ability to identify and utilize support systems that promote safety will improve Outcome: Progressing   Problem: Self-Concept: Goal: Will verbalize positive feelings about self Outcome: Progressing Goal: Level of anxiety will decrease Outcome: Progressing   Problem: Education: Goal: Knowledge of [place] [facility] Education information/materials will improve Outcome: Progressing Goal: Emotional status will improve Outcome: Progressing Goal: Mental status will improve Outcome: Progressing Goal: Verbalization of understanding the information provided will improve Outcome: Progressing   Problem: Activity: Goal: Interest or engagement in activities will improve Outcome: Progressing Goal: Sleeping patterns will improve Outcome: Progressing   Problem: Coping: Goal: Ability to verbalize frustrations and anger appropriately will improve Outcome: Progressing Goal: Ability to demonstrate self-control will improve Outcome: Progressing   Problem: Health Behavior/Discharge Planning: Goal: Identification of resources available to assist in meeting health care needs will improve Outcome: Progressing Goal: Compliance with treatment plan for underlying cause of condition will improve Outcome: Progressing   Problem: Physical Regulation: Goal: Ability to maintain clinical measurements within normal limits will improve Outcome: Progressing    Problem: Safety: Goal: Periods of time without injury will increase Outcome: Progressing   Problem: Education: Goal: Understanding of cardiac disease, CV risk reduction, and recovery process will improve Outcome: Progressing Goal: Individualized Educational Video(s) Outcome: Progressing   Problem: Activity: Goal: Ability to tolerate increased activity will improve Outcome: Progressing   Problem: Cardiac: Goal: Ability to achieve and maintain adequate cardiovascular perfusion will improve Outcome: Progressing   Problem: Health Behavior/Discharge Planning: Goal: Ability to safely manage health-related needs after discharge will improve Outcome: Progressing   Problem: Activity: Goal: Ability to tolerate increased activity will  improve Outcome: Progressing   Problem: Respiratory: Goal: Ability to maintain a clear airway and adequate ventilation will improve Outcome: Progressing

## 2023-02-20 NOTE — Plan of Care (Signed)
Problem: Education: Goal: Knowledge of General Education information will improve Description: Including pain rating scale, medication(s)/side effects and non-pharmacologic comfort measures Outcome: Not Progressing   Problem: Health Behavior/Discharge Planning: Goal: Ability to manage health-related needs will improve Outcome: Not Progressing   Problem: Clinical Measurements: Goal: Ability to maintain clinical measurements within normal limits will improve Outcome: Not Progressing Goal: Will remain free from infection Outcome: Not Progressing Goal: Diagnostic test results will improve Outcome: Not Progressing Goal: Respiratory complications will improve Outcome: Not Progressing Goal: Cardiovascular complication will be avoided Outcome: Not Progressing   Problem: Activity: Goal: Risk for activity intolerance will decrease Outcome: Not Progressing   Problem: Nutrition: Goal: Adequate nutrition will be maintained Outcome: Not Progressing   Problem: Coping: Goal: Level of anxiety will decrease Outcome: Not Progressing   Problem: Elimination: Goal: Will not experience complications related to bowel motility Outcome: Not Progressing Goal: Will not experience complications related to urinary retention Outcome: Not Progressing   Problem: Pain Managment: Goal: General experience of comfort will improve Outcome: Not Progressing   Problem: Safety: Goal: Ability to remain free from injury will improve Outcome: Not Progressing   Problem: Skin Integrity: Goal: Risk for impaired skin integrity will decrease Outcome: Not Progressing   Problem: Education: Goal: Utilization of techniques to improve thought processes will improve Outcome: Not Progressing Goal: Knowledge of the prescribed therapeutic regimen will improve Outcome: Not Progressing   Problem: Activity: Goal: Interest or engagement in leisure activities will improve Outcome: Not Progressing Goal: Imbalance in  normal sleep/wake cycle will improve Outcome: Not Progressing   Problem: Coping: Goal: Coping ability will improve Outcome: Not Progressing Goal: Will verbalize feelings Outcome: Not Progressing   Problem: Health Behavior/Discharge Planning: Goal: Ability to make decisions will improve Outcome: Not Progressing Goal: Compliance with therapeutic regimen will improve Outcome: Not Progressing   Problem: Role Relationship: Goal: Will demonstrate positive changes in social behaviors and relationships Outcome: Not Progressing   Problem: Safety: Goal: Ability to disclose and discuss suicidal ideas will improve Outcome: Not Progressing Goal: Ability to identify and utilize support systems that promote safety will improve Outcome: Not Progressing   Problem: Self-Concept: Goal: Will verbalize positive feelings about self Outcome: Not Progressing Goal: Level of anxiety will decrease Outcome: Not Progressing   Problem: Education: Goal: Knowledge of [place] [facility] Education information/materials will improve Outcome: Not Progressing Goal: Emotional status will improve Outcome: Not Progressing Goal: Mental status will improve Outcome: Not Progressing Goal: Verbalization of understanding the information provided will improve Outcome: Not Progressing   Problem: Activity: Goal: Interest or engagement in activities will improve Outcome: Not Progressing Goal: Sleeping patterns will improve Outcome: Not Progressing   Problem: Coping: Goal: Ability to verbalize frustrations and anger appropriately will improve Outcome: Not Progressing Goal: Ability to demonstrate self-control will improve Outcome: Not Progressing   Problem: Health Behavior/Discharge Planning: Goal: Identification of resources available to assist in meeting health care needs will improve Outcome: Not Progressing Goal: Compliance with treatment plan for underlying cause of condition will improve Outcome: Not  Progressing   Problem: Physical Regulation: Goal: Ability to maintain clinical measurements within normal limits will improve Outcome: Not Progressing   Problem: Safety: Goal: Periods of time without injury will increase Outcome: Not Progressing   Problem: Education: Goal: Understanding of cardiac disease, CV risk reduction, and recovery process will improve Outcome: Not Progressing Goal: Individualized Educational Video(s) Outcome: Not Progressing   Problem: Activity: Goal: Ability to tolerate increased activity will improve Outcome: Not Progressing   Problem: Cardiac:  Goal: Ability to achieve and maintain adequate cardiovascular perfusion will improve Outcome: Not Progressing   Problem: Health Behavior/Discharge Planning: Goal: Ability to safely manage health-related needs after discharge will improve Outcome: Not Progressing   Problem: Activity: Goal: Ability to tolerate increased activity will improve Outcome: Not Progressing   Problem: Respiratory: Goal: Ability to maintain a clear airway and adequate ventilation will improve Outcome: Not Progressing

## 2023-02-20 NOTE — Progress Notes (Signed)
D- Patient alert and oriented x 4 Affect flat/mood congruent. Denies SI/ HI/ AVH. Patient complains of chronic back  pain 8/10 and takes PRN Oxycodone every 6 hours as needed with fair results. Patient endorses mild depression and anxiety but states it is much better and she is ready for discharge. A- Scheduled medications administered to patient, per MD orders. Support and encouragement provided.  Routine safety checks conducted every 15 minutes without incident.  Patient informed to notify staff with problems or concerns and verbalizes understanding. R- No adverse drug reactions noted.  Patient compliant with medications and treatment plan. Patient receptive, calm cooperative and interacts well with others on the unit.  Patient contracts for safety and  remains safe on the unit at this time.

## 2023-02-20 NOTE — Plan of Care (Signed)
  Problem: Coping: Goal: Level of anxiety will decrease Outcome: Progressing   Problem: Safety: Goal: Ability to remain free from injury will improve Outcome: Progressing   Problem: Education: Goal: Knowledge of the prescribed therapeutic regimen will improve Outcome: Progressing   Problem: Coping: Goal: Coping ability will improve Outcome: Progressing   Problem: Safety: Goal: Ability to disclose and discuss suicidal ideas will improve Outcome: Progressing

## 2023-02-21 DIAGNOSIS — F3130 Bipolar disorder, current episode depressed, mild or moderate severity, unspecified: Secondary | ICD-10-CM | POA: Diagnosis not present

## 2023-02-21 LAB — GLUCOSE, CAPILLARY
Glucose-Capillary: 96 mg/dL (ref 70–99)
Glucose-Capillary: 114 mg/dL — ABNORMAL HIGH (ref 70–99)
Glucose-Capillary: 109 mg/dL — ABNORMAL HIGH (ref 70–99)
Glucose-Capillary: 187 mg/dL — ABNORMAL HIGH (ref 70–99)

## 2023-02-21 MED ORDER — LURASIDONE HCL 40 MG PO TABS
60.0000 mg | ORAL_TABLET | Freq: Every day | ORAL | Status: DC
  Administered 2023-02-21 – 2023-02-22 (×2): 60 mg via ORAL
  Filled 2023-02-21 (×2): qty 2

## 2023-02-21 NOTE — Group Note (Unsigned)
Date:  02/21/2023 Time:  1:58 AM  Group Topic/Focus:  Spirituality:   The focus of this group is to discuss how one's spirituality can aide in recovery.     Participation Level:  {BHH PARTICIPATION QIHKV:42595}  Participation Quality:  {BHH PARTICIPATION QUALITY:22265}  Affect:  {BHH AFFECT:22266}  Cognitive:  {BHH COGNITIVE:22267}  Insight: {BHH Insight2:20797}  Engagement in Group:  {BHH ENGAGEMENT IN GLOVF:64332}  Modes of Intervention:  {BHH MODES OF INTERVENTION:22269}  Additional Comments:  ***  Lenore Cordia 02/21/2023, 1:58 AM

## 2023-02-21 NOTE — BHH Counselor (Addendum)
ADDENDUM CSW has provided patient with walk in information and several agencies that accept her insurance.   Penni Homans, MSW, LCSW 02/21/2023 4:00 PM      CSW met with pt to discuss aftercare plans.    Patient declined to allow CSW to schedule for psychiatry or therapy.  Pt stated "I am fine".    CSW explained concerns for patient's admission and recommendations for aftercare.  Pt again stated "I am fine".   Pt again declined to allow for aftercare planning.  CSW has made provider team aware of this via secure chat as patient is expected to discharge on 02/22/2023.  Penni Homans, MSW, LCSW 02/21/2023 3:53 PM

## 2023-02-21 NOTE — Progress Notes (Signed)
[facility] MD Progress Note  02/21/2023 3:15 PM [name] [name]  MRN:  [id number]  Subjective:  Pt chart reviewed, discussed with interdisciplinary team, and seen on rounds. The patient is no longer acutely at risk for suicide. She is not participating in group activities or engaging with the milieu and does not appear to be benefiting from continued inpatient care. During the assessment, the patient was observed lying in bed in a dark room. She expressed feeling "tired" and was somewhat guarded during interactions.  The patient reports feeling better and expresses a desire to return home, attributing her improved mood to rest and medication. She denies having access to firearms and states she is sleeping adequately, though she reports poor appetite due to dissatisfaction with food choices ("I don't like the foods"). Despite previous statements about wanting to "blow her brains out," she adamantly denies access to guns and currently denies any suicidal or homicidal ideations as well as hallucinations.  On a Likert scale where 10 represents the worst depression and anxiety, she rated her current levels as 8/10. This rating may reflect some discomfort from being disturbed and awakened. She received one dose of prn hydroxyzine yesterday for anxiety but has not needed any prn medications today.  Principal Problem: Bipolar I disorder, most recent episode depressed (HCC) Diagnosis: Active Problems:   DM2 (diabetes mellitus, type 2) (HCC)   Chronic systolic CHF (congestive heart failure) (HCC)   COPD (chronic obstructive pulmonary disease) (HCC)   Prolonged QT interval   Hypotension  Total Time spent with patient:  25 minutes  Past Psychiatric History: bipolar d/o  Past Medical History:  Past Medical History:  Diagnosis Date   Anxiety disorder    Asthma    Bipolar affective disorder (HCC)    Blood transfusion without reported diagnosis    CHF (congestive heart failure) (HCC)    COPD (chronic  obstructive pulmonary disease) (HCC)    Depression    Diabetes mellitus    Migraine     Past Surgical History:  Procedure Laterality Date   KNEE ARTHROSCOPY     x 2   KNEE SURGERY Left    Incision made and knee cleaned out   TUBAL LIGATION     Family History:  Family History  Problem Relation Age of Onset   Bipolar disorder Mother    Hypertension Father    Diabetes Father    Family Psychiatric  History: see above Social History:  Social History   Substance and Sexual Activity  Alcohol Use Not Currently   Comment: occasionally     Social History   Substance and Sexual Activity  Drug Use No    Social History   Socioeconomic History   Marital status: Divorced    Spouse name: Not on file   Number of children: Not on file   Years of education: Not on file   Highest education level: Not on file  Occupational History   Not on file  Tobacco Use   Smoking status: Every Day    Current packs/day: 1.00    Average packs/day: 1 pack/day for 29.0 years (29.0 ttl pk-yrs)    Types: Cigarettes   Smokeless tobacco: Never  Vaping Use   Vaping status: Former  Substance and Sexual Activity   Alcohol use: Not Currently    Comment: occasionally   Drug use: No   Sexual activity: Yes    Birth control/protection: Surgical  Other Topics Concern   Not on file  Social History Narrative  Patient lives in [place] with her 2 kids- 35 in 26 years old.   She has total 4 kids.   She is a single mom.         Social Determinants of Health   Financial Resource Strain: High Risk (09/30/2022)   Overall Financial Resource Strain (CARDIA)    Difficulty of Paying Living Expenses: Hard  Food Insecurity: No Food Insecurity (02/12/2023)   Hunger Vital Sign    Worried About Running Out of Food in the Last Year: Never true    Ran Out of Food in the Last Year: Never true  Recent Concern: Food Insecurity - Food Insecurity Present (02/07/2023)   Hunger Vital Sign    Worried About Running Out  of Food in the Last Year: Sometimes true    Ran Out of Food in the Last Year: Sometimes true  Transportation Needs: No Transportation Needs (02/12/2023)   PRAPARE - Administrator, Civil Service (Medical): No    Lack of Transportation (Non-Medical): No  Recent Concern: Transportation Needs - Unmet Transportation Needs (02/06/2023)   PRAPARE - Transportation    Lack of Transportation (Medical): Yes    Lack of Transportation (Non-Medical): Yes  Physical Activity: Not on file  Stress: No Stress Concern Present (01/07/2022)   Received from [facility], [facility]   [employer] [facility] - Occupational Stress Questionnaire    Feeling of Stress : Not at all  Social Connections: Unknown (10/19/2021)   Received from [facility], [facility]   Social Network    Social Network: Not on file   Additional Social History:                         Sleep: Poor  Appetite:  Poor  Current Medications: Current Facility-Administered Medications  Medication Dose Route Frequency Provider Last Rate Last Admin   acetaminophen (TYLENOL) tablet 650 mg  650 mg Oral Q6H PRN Maryagnes Amos, FNP   650 mg at 02/13/23 2137   alum & mag hydroxide-simeth (MAALOX/MYLANTA) 200-200-20 MG/5ML suspension 30 mL  30 mL Oral Q4H PRN Starkes-Perry, Juel Burrow, FNP       bumetanide (BUMEX) tablet 1 mg  1 mg Oral Daily Arnetha Courser, MD   1 mg at 02/21/23 0928   cholecalciferol (VITAMIN D3) 25 MCG (1000 UNIT) tablet 1,000 Units  1,000 Units Oral Daily Maryagnes Amos, FNP   1,000 Units at 02/21/23 0941   docusate sodium (COLACE) capsule 100 mg  100 mg Oral BID Maryagnes Amos, FNP   100 mg at 02/21/23 0941   feeding supplement (GLUCERNA SHAKE) (GLUCERNA SHAKE) liquid 237 mL  237 mL Oral TID BM Charm Rings, NP   237 mL at 02/21/23 1451   haloperidol (HALDOL) tablet 5 mg  5 mg Oral TID PRN Maryagnes Amos, FNP       Or   haloperidol lactate (HALDOL)  injection 5 mg  5 mg Intramuscular TID PRN Maryagnes Amos, FNP       hydrOXYzine (ATARAX) tablet 50 mg  50 mg Oral TID PRN Lauree Chandler, NP   50 mg at 02/19/23 1716   influenza vac split trivalent PF (FLULAVAL) injection 0.5 mL  0.5 mL Intramuscular Tomorrow-1000 Lewanda Rife, MD       insulin aspart (novoLOG) injection 0-15 Units  0-15 Units Subcutaneous TID WC Maryagnes Amos, FNP   2 Units at 02/20/23 1631   insulin aspart (novoLOG)  injection 0-5 Units  0-5 Units Subcutaneous QHS Maryagnes Amos, FNP   4 Units at 02/14/23 2129   insulin glargine-yfgn (SEMGLEE) injection 15 Units  15 Units Subcutaneous Daily Mikey College T, MD   15 Units at 02/21/23 5188   ipratropium-albuterol (DUONEB) 0.5-2.5 (3) MG/3ML nebulizer solution 3 mL  3 mL Nebulization Q6H PRN Starkes-Perry, Juel Burrow, FNP       lidocaine (XYLOCAINE) 5 % ointment   Topical QID PRN Starkes-Perry, Juel Burrow, FNP       LORazepam (ATIVAN) tablet 0.5 mg  0.5 mg Oral Daily PRN Charm Rings, NP       lurasidone (LATUDA) tablet 60 mg  60 mg Oral Q supper Starkes-Perry, Keyonda Bickle S, FNP       magnesium hydroxide (MILK OF MAGNESIA) suspension 30 mL  30 mL Oral Daily PRN Starkes-Perry, Juel Burrow, FNP       midodrine (PROAMATINE) tablet 5 mg  5 mg Oral TID WC Maryagnes Amos, FNP   5 mg at 02/21/23 1304   mometasone-formoterol (DULERA) 200-5 MCG/ACT inhaler 2 puff  2 puff Inhalation BID Maryagnes Amos, FNP   2 puff at 02/21/23 0930   nicotine polacrilex (NICORETTE) gum 2 mg  2 mg Oral PRN Maryagnes Amos, FNP   2 mg at 02/14/23 2134   oxyCODONE (Oxy IR/ROXICODONE) immediate release tablet 10 mg  10 mg Oral Q6H PRN Maryagnes Amos, FNP   10 mg at 02/21/23 1503   pneumococcal 20-valent conjugate vaccine (PREVNAR 20) injection 0.5 mL  0.5 mL Intramuscular Tomorrow-1000 Lewanda Rife, MD       polyethylene glycol (MIRALAX / GLYCOLAX) packet 17 g  17 g Oral Daily Starkes-Perry, Shelton Soler S, FNP        rosuvastatin (CRESTOR) tablet 20 mg  20 mg Oral Daily Maryagnes Amos, FNP   20 mg at 02/21/23 4166   senna (SENOKOT) tablet 8.6 mg  1 tablet Oral BID Cinderella, Margaret A   8.6 mg at 02/21/23 0630   zolpidem (AMBIEN) tablet 5 mg  5 mg Oral QHS Charm Rings, NP   5 mg at 02/20/23 2109    Lab Results:  Results for orders placed or performed during the hospital encounter of 02/12/23 (from the past 48 hour(s))  Glucose, capillary     Status: Abnormal   Collection Time: 02/19/23  4:53 PM  Result Value Ref Range   Glucose-Capillary 136 (H) 70 - 99 mg/dL    Comment: Glucose reference range applies only to samples taken after fasting for at least 8 hours.  Glucose, capillary     Status: Abnormal   Collection Time: 02/19/23  9:12 PM  Result Value Ref Range   Glucose-Capillary 183 (H) 70 - 99 mg/dL    Comment: Glucose reference range applies only to samples taken after fasting for at least 8 hours.  Glucose, capillary     Status: None   Collection Time: 02/20/23  6:45 AM  Result Value Ref Range   Glucose-Capillary 98 70 - 99 mg/dL    Comment: Glucose reference range applies only to samples taken after fasting for at least 8 hours.  Glucose, capillary     Status: None   Collection Time: 02/20/23  7:42 AM  Result Value Ref Range   Glucose-Capillary 85 70 - 99 mg/dL    Comment: Glucose reference range applies only to samples taken after fasting for at least 8 hours.  Glucose, capillary     Status: Abnormal  Collection Time: 02/20/23  9:32 AM  Result Value Ref Range   Glucose-Capillary 101 (H) 70 - 99 mg/dL    Comment: Glucose reference range applies only to samples taken after fasting for at least 8 hours.  Glucose, capillary     Status: Abnormal   Collection Time: 02/20/23  4:26 PM  Result Value Ref Range   Glucose-Capillary 126 (H) 70 - 99 mg/dL    Comment: Glucose reference range applies only to samples taken after fasting for at least 8 hours.  Glucose, capillary      Status: Abnormal   Collection Time: 02/20/23  8:32 PM  Result Value Ref Range   Glucose-Capillary 158 (H) 70 - 99 mg/dL    Comment: Glucose reference range applies only to samples taken after fasting for at least 8 hours.  Glucose, capillary     Status: None   Collection Time: 02/21/23  6:42 AM  Result Value Ref Range   Glucose-Capillary 96 70 - 99 mg/dL    Comment: Glucose reference range applies only to samples taken after fasting for at least 8 hours.  Glucose, capillary     Status: Abnormal   Collection Time: 02/21/23 11:50 AM  Result Value Ref Range   Glucose-Capillary 114 (H) 70 - 99 mg/dL    Comment: Glucose reference range applies only to samples taken after fasting for at least 8 hours.    Blood Alcohol level:  Lab Results  Component Value Date   ETH <10 08/14/2022   ETH <10 07/30/2020    Metabolic Disorder Labs: Lab Results  Component Value Date   HGBA1C 8.4 (H) 02/07/2023   MPG 194.38 02/07/2023   MPG 269 08/27/2022   Lab Results  Component Value Date   PROLACTIN 10.6 08/31/2015   Lab Results  Component Value Date   CHOL 196 02/14/2023   TRIG 77 02/14/2023   HDL 47 02/14/2023   CHOLHDL 4.2 02/14/2023   VLDL 15 02/14/2023   LDLCALC 134 (H) 02/14/2023   LDLCALC 79 08/27/2022    Physical Findings: AIMS:  , ,  ,  ,    CIWA:    COWS:     Musculoskeletal: Strength & Muscle Tone: within normal limits Gait & Station: normal Patient leans: N/A  Psychiatric Specialty Exam:  Presentation  General Appearance:  Disheveled  Eye Contact: Fair  Speech: Clear and Coherent; Normal Rate  Speech Volume: Normal  Handedness: Right   Mood and Affect  Mood: -- ("real tired")  Affect: Flat   Thought Process  Thought Processes: Coherent; Goal Directed; Linear  Descriptions of Associations:Intact  Orientation:Full (Time, Place and Person)  Thought Content:WDL  History of Schizophrenia/Schizoaffective disorder:No data recorded Duration of  Psychotic Symptoms:No data recorded Hallucinations:Hallucinations: None  Ideas of Reference:None  Suicidal Thoughts:Suicidal Thoughts: Yes, Passive  Homicidal Thoughts:Homicidal Thoughts: No   Sensorium  Memory: Immediate Good; Recent Good; Remote Good  Judgment: Intact  Insight: Shallow   Executive Functions  Concentration: Good  Attention Span: Good  Recall: Good  Fund of Knowledge: Good  Language: Good   Psychomotor Activity  Psychomotor Activity: Psychomotor Activity: Normal   Assets  Assets: Communication Skills; Desire for Improvement; Financial Resources/Insurance; Housing; Resilience   Sleep  Sleep: Sleep: Poor    Physical Exam: Physical Exam Constitutional:      General: She is not in acute distress.    Appearance: Normal appearance. She is normal weight. She is not ill-appearing, toxic-appearing or diaphoretic.  Eyes:     General: No scleral icterus.  Cardiovascular:     Rate and Rhythm: Normal rate.  Pulmonary:     Effort: Pulmonary effort is normal. No respiratory distress.  Neurological:     General: No focal deficit present.     Mental Status: She is alert and oriented to person, place, and time. Mental status is at baseline.  Psychiatric:        Attention and Perception: Attention and perception normal.        Mood and Affect: Affect normal. Mood is depressed. Affect is not flat.        Speech: Speech normal.        Behavior: Behavior normal. Behavior is cooperative.        Thought Content: Thought content normal. Thought content is not paranoid. Thought content does not include homicidal or suicidal ideation.        Cognition and Memory: Cognition and memory normal.        Judgment: Judgment normal.    Review of Systems  Constitutional:  Negative for chills and fever.  Respiratory:  Negative for shortness of breath.   Cardiovascular:  Negative for chest pain and palpitations.  Gastrointestinal:  Negative for abdominal  pain.  Neurological:  Negative for headaches.  Psychiatric/Behavioral:  Positive for depression. Negative for suicidal ideas.   All other systems reviewed and are negative.  Blood pressure (!) 104/56, pulse 68, temperature 99.1 F (37.3 C), temperature source Oral, resp. rate 20, height 5\' 5"  (1.651 m), weight 73.7 kg, SpO2 98%. Body mass index is 27.04 kg/m.   Treatment Plan Summary: Daily contact with patient to assess and evaluate symptoms and progress in treatment, Medication management, and Plan    Bipolar 1 disorder -Increase Latuda 60mg  oral daily with supper   Nicotine cessation -nicorette 2mg  oral as needed smoking cessation   Agitation PRNs -Haldol 5mg  PO or IM 3 times daily PRN agitation -Ativan 2mg  PO or IM 3 times daily PRN agitation   Anxiety -Hydroxyzine 25mg  oral 3 times daily PRN anxiety -Ativan 0.5 mg daily PRN, not to be given after 6 pm because of Ambien at bedtime   Insomnia: Ambien 5 mg daily at bedtime   Please prepare for discharge. In the interim please encourage patient to attend groups.   Maryagnes Amos, FNP 02/21/2023, 3:15 PM

## 2023-02-21 NOTE — Progress Notes (Signed)
Scheduled 1700 Insulin was not given due to CBG being 109 at 1654.

## 2023-02-21 NOTE — Progress Notes (Signed)
D- Patient alert and oriented x 4. Affect flat/mood congruent. Denies SI/ HI/ AVH. Patient endorses chronic back pain 8/10 and relieved 4/10 with PRN Oxycodone. Patient endorses mild depression and anxiety. States she is ready for discharge. FSBS WNL with no s/s insulin required. A- Scheduled medications administered to patient, per MD orders. Support and encouragement provided.  Routine safety checks conducted every 15 minutes without incident.  Patient informed to notify staff with problems or concerns and verbalizes understanding. R- No adverse drug reactions noted.  Patient compliant with medications and treatment plan. Patient receptive, calm cooperative. She was isolative to her room today.  Patient contracts for safety and  remains safe on the unit at this time.

## 2023-02-21 NOTE — Group Note (Signed)
Recreation Therapy Group Note   Group Topic:Leisure Education  Group Date: 02/21/2023 Start Time: 1000 End Time: 1100 Facilitators: Rosina Lowenstein, LRT, CTRS Location:  Craft Room  Group Description: Leisure. Patients were given the option to choose from singing karaoke, coloring mandalas, using oil pastels, journaling, or playing with play-doh. LRT and pts discussed the meaning of leisure, the importance of participating in leisure during their free time/when they're outside of the hospital, as well as how our leisure interests can also serve as coping skills.    Goal Area(s) Addressed:  Patient will identify a current leisure interest.  Patient will learn the definition of "leisure". Patient will practice making a positive decision. Patient will have the opportunity to try a new leisure activity. Patient will communicate with peers and LRT.    Affect/Mood: N/A   Participation Level: Did not attend    Clinical Observations/Individualized Feedback: [name] [name] did not attend group.  Plan: Continue to engage patient in RT group sessions 2-3x/week.   Rosina Lowenstein, LRT, CTRS 02/21/2023 11:21 AM

## 2023-02-21 NOTE — Plan of Care (Signed)
Problem: Health Behavior/Discharge Planning: Goal: Ability to manage health-related needs will improve Outcome: Progressing   Problem: Clinical Measurements: Goal: Ability to maintain clinical measurements within normal limits will improve Outcome: Progressing

## 2023-02-21 NOTE — Group Note (Signed)
Date:  02/21/2023 Time:  7:18 PM  Group Topic/Focus:  Activity Group:  The purpose of the group was to hold a card game and communicate with each other to talk about our feelings.    Participation Level:  Did Not Attend   [name] [name] [name] [name] 02/21/2023, 7:18 PM

## 2023-02-21 NOTE — Progress Notes (Signed)
Nursing Shift Note:  1900-0700  Attended Evening Group: no Medication Compliant:  yes Behavior: cooperative; anxious; depressed Sleep Quality: fair Significant Changes: none   02/21/23 2300  Psych Admission Type (Psych Patients Only)  Admission Status Involuntary  Psychosocial Assessment  Patient Complaints None  Eye Contact Fair  Facial Expression Flat  Affect Appropriate to circumstance  Speech Logical/coherent  Interaction Minimal  Motor Activity Slow  Appearance/Hygiene Unremarkable  Behavior Characteristics Cooperative  Mood Pleasant  Aggressive Behavior  Effect No apparent injury  Thought Process  Coherency WDL  Content WDL  Delusions None reported or observed  Perception WDL  Hallucination None reported or observed  Judgment Impaired  Confusion Mild  Danger to Self  Current suicidal ideation? Denies  Danger to Others  Danger to Others None reported or observed

## 2023-02-21 NOTE — Group Note (Signed)
Date:  02/21/2023 Time:  10:46 PM  Group Topic/Focus:  Wrap-Up Group:   The focus of this group is to help patients review their daily goal of treatment and discuss progress on daily workbooks.     Participation Level:  Did Not Attend  Participation Quality:   none  Affect:   none  Cognitive:   none  Insight: None  Engagement in Group:   none  Modes of Intervention:   none  Additional Comments:  none   Belva Crome 02/21/2023, 10:46 PM

## 2023-02-22 DIAGNOSIS — F3130 Bipolar disorder, current episode depressed, mild or moderate severity, unspecified: Secondary | ICD-10-CM | POA: Diagnosis not present

## 2023-02-22 LAB — GLUCOSE, CAPILLARY
Glucose-Capillary: 102 mg/dL — ABNORMAL HIGH (ref 70–99)
Glucose-Capillary: 149 mg/dL — ABNORMAL HIGH (ref 70–99)

## 2023-02-22 MED ORDER — LURASIDONE HCL 60 MG PO TABS
60.0000 mg | ORAL_TABLET | Freq: Every day | ORAL | 0 refills | Status: DC
  Filled 2023-02-22: qty 30, 30d supply, fill #0

## 2023-02-22 MED ORDER — BUMETANIDE 1 MG PO TABS
1.0000 mg | ORAL_TABLET | Freq: Every day | ORAL | 0 refills | Status: DC
  Filled 2023-02-22: qty 30, 30d supply, fill #0

## 2023-02-22 NOTE — Progress Notes (Signed)
  [facility] Adult Case Management Discharge Plan :  Will you be returning to the same living situation after discharge:  [address]  At discharge, do you have transportation home?: No. Do you have the ability to pay for your medications: Yes,  [employer] [facility]  Release of information consent forms completed and in the chart;  Patient's signature needed at discharge.  Patient to Follow up at:  Follow-up Information     [facility]  Follow up.   Why: Walk in hours are from 8AM to 2PM, Monday, Wednesday and Friday.  Please call to get scheduled. Contact information: [address] [place] [address] [phone]         [facility] .   Why: This agency accepts your insurance, please call to schedule a follow up appointment. Contact information: [address] [place] [address] [phone]         [facility] .   Why: This agency accepts your insurance, please call to schedule a follow up appointment. Contact information: [address] [place] [address] [phone]         [facility] Follow up.   Why: This agency accepts your insurance, please call to schedule a follow up appointment. Contact information: [address] [place] [address] [phone]                 Next level of care provider has access to [facility] Link:yes  Safety Planning and Suicide Prevention discussed: Yes,  with patient      Has patient been referred to the Quitline?: Patient refused referral for treatment  Patient has been referred for addiction treatment: Yes, referral information given but appointment not made [facility] (list facility).  Marshell Levan, LCSW 02/22/2023, 11:31 AM

## 2023-02-22 NOTE — Group Note (Signed)
Date:  02/22/2023 Time:  4:55 PM  Group Topic/Focus:  Activity Group:  The focus of this group is to encourage patients to go outside to the courtyard to assist with their mental health to get some fresh air and exercise.     Participation Level:  Did Not Attend   [name] [name] Dahan 02/22/2023, 4:55 PM

## 2023-02-22 NOTE — Plan of Care (Signed)
Problem: Education: Goal: Knowledge of General Education information will improve Description: Including pain rating scale, medication(s)/side effects and non-pharmacologic comfort measures 02/22/2023 1514 by Delos Haring, RN Outcome: Adequate for Discharge 02/22/2023 1153 by Delos Haring, RN Outcome: Adequate for Discharge   Problem: Health Behavior/Discharge Planning: Goal: Ability to manage health-related needs will improve 02/22/2023 1514 by Delos Haring, RN Outcome: Adequate for Discharge 02/22/2023 1153 by Delos Haring, RN Outcome: Adequate for Discharge   Problem: Clinical Measurements: Goal: Ability to maintain clinical measurements within normal limits will improve 02/22/2023 1514 by Delos Haring, RN Outcome: Adequate for Discharge 02/22/2023 1153 by Delos Haring, RN Outcome: Adequate for Discharge Goal: Will remain free from infection 02/22/2023 1514 by Delos Haring, RN Outcome: Adequate for Discharge 02/22/2023 1153 by Delos Haring, RN Outcome: Adequate for Discharge Goal: Diagnostic test results will improve 02/22/2023 1514 by Delos Haring, RN Outcome: Adequate for Discharge 02/22/2023 1153 by Delos Haring, RN Outcome: Adequate for Discharge Goal: Respiratory complications will improve 02/22/2023 1514 by Delos Haring, RN Outcome: Adequate for Discharge 02/22/2023 1153 by Delos Haring, RN Outcome: Adequate for Discharge Goal: Cardiovascular complication will be avoided 02/22/2023 1514 by Delos Haring, RN Outcome: Adequate for Discharge 02/22/2023 1153 by Delos Haring, RN Outcome: Adequate for Discharge   Problem: Activity: Goal: Risk for activity intolerance will decrease 02/22/2023 1514 by Delos Haring, RN Outcome: Adequate for Discharge 02/22/2023 1153 by Delos Haring, RN Outcome: Adequate for Discharge   Problem: Nutrition: Goal: Adequate nutrition will be maintained 02/22/2023 1514 by Delos Haring, RN Outcome:  Adequate for Discharge 02/22/2023 1153 by Delos Haring, RN Outcome: Adequate for Discharge   Problem: Coping: Goal: Level of anxiety will decrease 02/22/2023 1514 by Delos Haring, RN Outcome: Adequate for Discharge 02/22/2023 1153 by Delos Haring, RN Outcome: Adequate for Discharge   Problem: Elimination: Goal: Will not experience complications related to bowel motility 02/22/2023 1514 by Delos Haring, RN Outcome: Adequate for Discharge 02/22/2023 1153 by Delos Haring, RN Outcome: Adequate for Discharge Goal: Will not experience complications related to urinary retention 02/22/2023 1514 by Delos Haring, RN Outcome: Adequate for Discharge 02/22/2023 1153 by Delos Haring, RN Outcome: Adequate for Discharge   Problem: Pain Managment: Goal: General experience of comfort will improve 02/22/2023 1514 by Delos Haring, RN Outcome: Adequate for Discharge 02/22/2023 1153 by Delos Haring, RN Outcome: Adequate for Discharge   Problem: Safety: Goal: Ability to remain free from injury will improve 02/22/2023 1514 by Delos Haring, RN Outcome: Adequate for Discharge 02/22/2023 1153 by Delos Haring, RN Outcome: Adequate for Discharge   Problem: Skin Integrity: Goal: Risk for impaired skin integrity will decrease 02/22/2023 1514 by Delos Haring, RN Outcome: Adequate for Discharge 02/22/2023 1153 by Delos Haring, RN Outcome: Adequate for Discharge   Problem: Education: Goal: Utilization of techniques to improve thought processes will improve 02/22/2023 1514 by Delos Haring, RN Outcome: Adequate for Discharge 02/22/2023 1153 by Delos Haring, RN Outcome: Adequate for Discharge Goal: Knowledge of the prescribed therapeutic regimen will improve 02/22/2023 1514 by Delos Haring, RN Outcome: Adequate for Discharge 02/22/2023 1153 by Delos Haring, RN Outcome: Adequate for Discharge   Problem: Activity: Goal: Interest or engagement in leisure activities  will improve 02/22/2023 1514 by Delos Haring, RN Outcome: Adequate for Discharge 02/22/2023 1153 by Delos Haring, RN Outcome: Adequate for Discharge Goal:  Imbalance in normal sleep/wake cycle will improve 02/22/2023 1514 by Delos Haring, RN Outcome: Adequate for Discharge 02/22/2023 1153 by Delos Haring, RN Outcome: Adequate for Discharge   Problem: Coping: Goal: Coping ability will improve 02/22/2023 1514 by Delos Haring, RN Outcome: Adequate for Discharge 02/22/2023 1153 by Delos Haring, RN Outcome: Adequate for Discharge Goal: Will verbalize feelings 02/22/2023 1514 by Delos Haring, RN Outcome: Adequate for Discharge 02/22/2023 1153 by Delos Haring, RN Outcome: Adequate for Discharge   Problem: Health Behavior/Discharge Planning: Goal: Ability to make decisions will improve 02/22/2023 1514 by Delos Haring, RN Outcome: Adequate for Discharge 02/22/2023 1153 by Delos Haring, RN Outcome: Adequate for Discharge Goal: Compliance with therapeutic regimen will improve 02/22/2023 1514 by Delos Haring, RN Outcome: Adequate for Discharge 02/22/2023 1153 by Delos Haring, RN Outcome: Adequate for Discharge   Problem: Role Relationship: Goal: Will demonstrate positive changes in social behaviors and relationships 02/22/2023 1514 by Delos Haring, RN Outcome: Adequate for Discharge 02/22/2023 1153 by Delos Haring, RN Outcome: Adequate for Discharge   Problem: Safety: Goal: Ability to disclose and discuss suicidal ideas will improve 02/22/2023 1514 by Delos Haring, RN Outcome: Adequate for Discharge 02/22/2023 1153 by Delos Haring, RN Outcome: Adequate for Discharge Goal: Ability to identify and utilize support systems that promote safety will improve 02/22/2023 1514 by Delos Haring, RN Outcome: Adequate for Discharge 02/22/2023 1153 by Delos Haring, RN Outcome: Adequate for Discharge   Problem: Self-Concept: Goal: Will verbalize positive  feelings about self 02/22/2023 1514 by Delos Haring, RN Outcome: Adequate for Discharge 02/22/2023 1153 by Delos Haring, RN Outcome: Adequate for Discharge Goal: Level of anxiety will decrease 02/22/2023 1514 by Delos Haring, RN Outcome: Adequate for Discharge 02/22/2023 1153 by Delos Haring, RN Outcome: Adequate for Discharge   Problem: Education: Goal: Knowledge of [place] General Education information/materials will improve 02/22/2023 1514 by Delos Haring, RN Outcome: Adequate for Discharge 02/22/2023 1153 by Delos Haring, RN Outcome: Adequate for Discharge Goal: Emotional status will improve 02/22/2023 1514 by Delos Haring, RN Outcome: Adequate for Discharge 02/22/2023 1153 by Delos Haring, RN Outcome: Adequate for Discharge Goal: Mental status will improve 02/22/2023 1514 by Delos Haring, RN Outcome: Adequate for Discharge 02/22/2023 1153 by Delos Haring, RN Outcome: Adequate for Discharge Goal: Verbalization of understanding the information provided will improve 02/22/2023 1514 by Delos Haring, RN Outcome: Adequate for Discharge 02/22/2023 1153 by Delos Haring, RN Outcome: Adequate for Discharge   Problem: Activity: Goal: Interest or engagement in activities will improve 02/22/2023 1514 by Delos Haring, RN Outcome: Adequate for Discharge 02/22/2023 1153 by Delos Haring, RN Outcome: Adequate for Discharge Goal: Sleeping patterns will improve 02/22/2023 1514 by Delos Haring, RN Outcome: Adequate for Discharge 02/22/2023 1153 by Delos Haring, RN Outcome: Adequate for Discharge   Problem: Coping: Goal: Ability to verbalize frustrations and anger appropriately will improve 02/22/2023 1514 by Delos Haring, RN Outcome: Adequate for Discharge 02/22/2023 1153 by Delos Haring, RN Outcome: Adequate for Discharge Goal: Ability to demonstrate self-control will improve 02/22/2023 1514 by Delos Haring, RN Outcome: Adequate for  Discharge 02/22/2023 1153 by Delos Haring, RN Outcome: Adequate for Discharge   Problem: Health Behavior/Discharge Planning: Goal: Identification of resources available to assist in meeting health care needs will improve 02/22/2023 1514 by Delos Haring, RN Outcome: Adequate for Discharge 02/22/2023  1153 by Delos Haring, RN Outcome: Adequate for Discharge Goal: Compliance with treatment plan for underlying cause of condition will improve 02/22/2023 1514 by Delos Haring, RN Outcome: Adequate for Discharge 02/22/2023 1153 by Delos Haring, RN Outcome: Adequate for Discharge   Problem: Physical Regulation: Goal: Ability to maintain clinical measurements within normal limits will improve 02/22/2023 1514 by Delos Haring, RN Outcome: Adequate for Discharge 02/22/2023 1153 by Delos Haring, RN Outcome: Adequate for Discharge   Problem: Safety: Goal: Periods of time without injury will increase 02/22/2023 1514 by Delos Haring, RN Outcome: Adequate for Discharge 02/22/2023 1153 by Delos Haring, RN Outcome: Adequate for Discharge   Problem: Education: Goal: Understanding of cardiac disease, CV risk reduction, and recovery process will improve 02/22/2023 1514 by Delos Haring, RN Outcome: Adequate for Discharge 02/22/2023 1153 by Delos Haring, RN Outcome: Adequate for Discharge Goal: Individualized Educational Video(s) 02/22/2023 1514 by Delos Haring, RN Outcome: Adequate for Discharge 02/22/2023 1153 by Delos Haring, RN Outcome: Adequate for Discharge   Problem: Activity: Goal: Ability to tolerate increased activity will improve 02/22/2023 1514 by Delos Haring, RN Outcome: Adequate for Discharge 02/22/2023 1153 by Delos Haring, RN Outcome: Adequate for Discharge   Problem: Cardiac: Goal: Ability to achieve and maintain adequate cardiovascular perfusion will improve 02/22/2023 1514 by Delos Haring, RN Outcome: Adequate for Discharge 02/22/2023 1153  by Delos Haring, RN Outcome: Adequate for Discharge   Problem: Health Behavior/Discharge Planning: Goal: Ability to safely manage health-related needs after discharge will improve 02/22/2023 1514 by Delos Haring, RN Outcome: Adequate for Discharge 02/22/2023 1153 by Delos Haring, RN Outcome: Adequate for Discharge   Problem: Activity: Goal: Ability to tolerate increased activity will improve 02/22/2023 1514 by Delos Haring, RN Outcome: Adequate for Discharge 02/22/2023 1153 by Delos Haring, RN Outcome: Adequate for Discharge   Problem: Respiratory: Goal: Ability to maintain a clear airway and adequate ventilation will improve 02/22/2023 1514 by Delos Haring, RN Outcome: Adequate for Discharge 02/22/2023 1153 by Delos Haring, RN Outcome: Adequate for Discharge

## 2023-02-22 NOTE — Progress Notes (Signed)
Discharge Note:  Patient denies SI/HI/AVH at this time. Discharge instructions, AVS and transition record gone over with patient. Patient agrees to comply with medication management, follow-up visit, and outpatient therapy. Patient belongings returned to patient. Patient questions and concerns addressed and answered. Patient ambulatory off unit. Patient discharged to home with her son.

## 2023-02-22 NOTE — Plan of Care (Signed)
Problem: Education: Goal: Knowledge of General Education information will improve Description: Including pain rating scale, medication(s)/side effects and non-pharmacologic comfort measures Outcome: Adequate for Discharge   Problem: Health Behavior/Discharge Planning: Goal: Ability to manage health-related needs will improve Outcome: Adequate for Discharge   Problem: Clinical Measurements: Goal: Ability to maintain clinical measurements within normal limits will improve Outcome: Adequate for Discharge Goal: Will remain free from infection Outcome: Adequate for Discharge Goal: Diagnostic test results will improve Outcome: Adequate for Discharge Goal: Respiratory complications will improve Outcome: Adequate for Discharge Goal: Cardiovascular complication will be avoided Outcome: Adequate for Discharge   Problem: Activity: Goal: Risk for activity intolerance will decrease Outcome: Adequate for Discharge   Problem: Nutrition: Goal: Adequate nutrition will be maintained Outcome: Adequate for Discharge   Problem: Coping: Goal: Level of anxiety will decrease Outcome: Adequate for Discharge   Problem: Elimination: Goal: Will not experience complications related to bowel motility Outcome: Adequate for Discharge Goal: Will not experience complications related to urinary retention Outcome: Adequate for Discharge   Problem: Pain Managment: Goal: General experience of comfort will improve Outcome: Adequate for Discharge   Problem: Safety: Goal: Ability to remain free from injury will improve Outcome: Adequate for Discharge   Problem: Skin Integrity: Goal: Risk for impaired skin integrity will decrease Outcome: Adequate for Discharge   Problem: Education: Goal: Utilization of techniques to improve thought processes will improve Outcome: Adequate for Discharge Goal: Knowledge of the prescribed therapeutic regimen will improve Outcome: Adequate for Discharge   Problem:  Activity: Goal: Interest or engagement in leisure activities will improve Outcome: Adequate for Discharge Goal: Imbalance in normal sleep/wake cycle will improve Outcome: Adequate for Discharge   Problem: Coping: Goal: Coping ability will improve Outcome: Adequate for Discharge Goal: Will verbalize feelings Outcome: Adequate for Discharge   Problem: Health Behavior/Discharge Planning: Goal: Ability to make decisions will improve Outcome: Adequate for Discharge Goal: Compliance with therapeutic regimen will improve Outcome: Adequate for Discharge   Problem: Role Relationship: Goal: Will demonstrate positive changes in social behaviors and relationships Outcome: Adequate for Discharge   Problem: Safety: Goal: Ability to disclose and discuss suicidal ideas will improve Outcome: Adequate for Discharge Goal: Ability to identify and utilize support systems that promote safety will improve Outcome: Adequate for Discharge   Problem: Self-Concept: Goal: Will verbalize positive feelings about self Outcome: Adequate for Discharge Goal: Level of anxiety will decrease Outcome: Adequate for Discharge   Problem: Education: Goal: Knowledge of [place] General Education information/materials will improve Outcome: Adequate for Discharge Goal: Emotional status will improve Outcome: Adequate for Discharge Goal: Mental status will improve Outcome: Adequate for Discharge Goal: Verbalization of understanding the information provided will improve Outcome: Adequate for Discharge   Problem: Activity: Goal: Interest or engagement in activities will improve Outcome: Adequate for Discharge Goal: Sleeping patterns will improve Outcome: Adequate for Discharge   Problem: Coping: Goal: Ability to verbalize frustrations and anger appropriately will improve Outcome: Adequate for Discharge Goal: Ability to demonstrate self-control will improve Outcome: Adequate for Discharge   Problem: Health  Behavior/Discharge Planning: Goal: Identification of resources available to assist in meeting health care needs will improve Outcome: Adequate for Discharge Goal: Compliance with treatment plan for underlying cause of condition will improve Outcome: Adequate for Discharge   Problem: Physical Regulation: Goal: Ability to maintain clinical measurements within normal limits will improve Outcome: Adequate for Discharge   Problem: Safety: Goal: Periods of time without injury will increase Outcome: Adequate for Discharge   Problem: Education: Goal: Understanding  of cardiac disease, CV risk reduction, and recovery process will improve Outcome: Adequate for Discharge Goal: Individualized Educational Video(s) Outcome: Adequate for Discharge   Problem: Activity: Goal: Ability to tolerate increased activity will improve Outcome: Adequate for Discharge   Problem: Cardiac: Goal: Ability to achieve and maintain adequate cardiovascular perfusion will improve Outcome: Adequate for Discharge   Problem: Health Behavior/Discharge Planning: Goal: Ability to safely manage health-related needs after discharge will improve Outcome: Adequate for Discharge   Problem: Activity: Goal: Ability to tolerate increased activity will improve Outcome: Adequate for Discharge   Problem: Respiratory: Goal: Ability to maintain a clear airway and adequate ventilation will improve Outcome: Adequate for Discharge

## 2023-02-22 NOTE — Discharge Summary (Signed)
Physician Discharge Summary Note  Patient:  [name] [name] is an 52 y.o., female MRN:  [id number] DOB:  1970-07-09 Patient phone:  [phone] (home)  Patient address:   .[address] [place] [address],  Total Time spent with patient: 45 minutes  Date of Admission:  02/12/2023 Date of Discharge: 02/22/2023  Reason for Admission:  suicidal ideations  Principal Problem: Bipolar I disorder, most recent episode depressed [facility]) Discharge Diagnoses: Active Problems:   DM2 (diabetes mellitus, type 2) (HCC)   Chronic systolic CHF (congestive heart failure) (HCC)   COPD (chronic obstructive pulmonary disease) (HCC)   Prolonged QT interval   Hypotension   Past Psychiatric History: anxiety, bipolar d/o, depression  Past Medical History:  Past Medical History:  Diagnosis Date   Anxiety disorder    Asthma    Bipolar affective disorder (HCC)    Blood transfusion without reported diagnosis    CHF (congestive heart failure) (HCC)    COPD (chronic obstructive pulmonary disease) (HCC)    Depression    Diabetes mellitus    Migraine     Past Surgical History:  Procedure Laterality Date   KNEE ARTHROSCOPY     x 2   KNEE SURGERY Left    Incision made and knee cleaned out   TUBAL LIGATION     Family History:  Family History  Problem Relation Age of Onset   Bipolar disorder Mother    Hypertension Father    Diabetes Father    Family Psychiatric  History: mother with bipolar d/o Social History:  Social History   Substance and Sexual Activity  Alcohol Use Not Currently   Comment: occasionally     Social History   Substance and Sexual Activity  Drug Use No    Social History   Socioeconomic History   Marital status: Divorced    Spouse name: Not on file   Number of children: Not on file   Years of education: Not on file   Highest education level: Not on file  Occupational History   Not on file  Tobacco Use   Smoking status: Every Day    Current  packs/day: 1.00    Average packs/day: 1 pack/day for 29.0 years (29.0 ttl pk-yrs)    Types: Cigarettes   Smokeless tobacco: Never  Vaping Use   Vaping status: Former  Substance and Sexual Activity   Alcohol use: Not Currently    Comment: occasionally   Drug use: No   Sexual activity: Yes    Birth control/protection: Surgical  Other Topics Concern   Not on file  Social History Narrative   Patient lives in [place] with her 2 kids- 14 in 32 years old.   She has total 4 kids.   She is a single mom.         Social Determinants of Health   Financial Resource Strain: High Risk (09/30/2022)   Overall Financial Resource Strain (CARDIA)    Difficulty of Paying Living Expenses: Hard  Food Insecurity: No Food Insecurity (02/12/2023)   Hunger Vital Sign    Worried About Running Out of Food in the Last Year: Never true    Ran Out of Food in the Last Year: Never true  Recent Concern: Food Insecurity - Food Insecurity Present (02/07/2023)   Hunger Vital Sign    Worried About Running Out of Food in the Last Year: Sometimes true    Ran Out of Food in the Last Year: Sometimes true  Transportation Needs: No  Transportation Needs (02/12/2023)   PRAPARE - Administrator, Civil Service (Medical): No    Lack of Transportation (Non-Medical): No  Recent Concern: Transportation Needs - Unmet Transportation Needs (02/06/2023)   PRAPARE - Transportation    Lack of Transportation (Medical): Yes    Lack of Transportation (Non-Medical): Yes  Physical Activity: Not on file  Stress: No Stress Concern Present (01/07/2022)   [facility], [facility]   [employer] of Occupational Health - Occupational Stress Questionnaire    Feeling of Stress : Not at all  Social Connections: Unknown (10/19/2021)   Received from [facility], [facility]   Social Network    Social Network: Not on file    Hospital Course:   53 yo female admitted for depression and suicidal ideations,  frustrated with her multiple health issues.  Today her depression is a 5/10 with no suicidal ideations, denies anxiety.  She has been wanting to discharge for awhile but could not obtain collateral information in regards to having guns in the home.  Her son, [name] [phone], with her permission was contacted today and denies she owns or has access to guns.  He has not safety concerns and confirms she will discharge to his care and stay with him in his home.  Phone call witnessed by a [employer] PMHNP student, midwife.  Client has met maximum capacity of hospitalization.  Denies suicidal/homicidal ideations, hallucinations, and substance abuse.  Discharge instructions provided with explanations along with crisis numbers, follow up information, and Rx.  Musculoskeletal: Strength & Muscle Tone: within normal limits Gait & Station: normal Patient leans: N/A  Psychiatric Specialty Exam: Physical Exam Vitals and nursing note reviewed.  Constitutional:      Appearance: Normal appearance.  HENT:     Head: Normocephalic.     Nose: Nose normal.  Pulmonary:     Effort: Pulmonary effort is normal.  Musculoskeletal:        General: Normal range of motion.     Cervical back: Normal range of motion.  Neurological:     General: No focal deficit present.     Mental Status: She is alert and oriented to person, place, and time.     Review of Systems  Psychiatric/Behavioral:  Positive for depression.   All other systems reviewed and are negative.   Blood pressure (!) 95/51, pulse 65, temperature 98.4 F (36.9 C), temperature source Oral, resp. rate 20, height 5\' 5"  (1.651 m), weight 73.9 kg, SpO2 97%.Body mass index is 27.12 kg/m.  General Appearance: Casual  Eye Contact:  Good  Speech:  Normal Rate  Volume:  Normal  Mood:  Depressed  Affect:  Congruent  Thought Process:  Coherent  Orientation:  Full (Time, Place, and Person)  Thought Content:  WDL and Logical  Suicidal Thoughts:  No  Homicidal  Thoughts:  No  Memory:  Immediate;   Good Recent;   Good Remote;   Good  Judgement:  Good  Insight:  Good  Psychomotor Activity:  Normal  Concentration:  Concentration: Good and Attention Span: Good  Recall:  Good  Fund of Knowledge:  Good  Language:  Good  Akathisia:  No  Handed:  Right  AIMS (if indicated):     Assets:  Housing Leisure Time Resilience Social Support  ADL's:  Intact  Cognition:  WNL  Sleep:        Physical Exam: Physical Exam Vitals and nursing note reviewed.  Constitutional:      Appearance: Normal  appearance.  HENT:     Head: Normocephalic.     Nose: Nose normal.  Pulmonary:     Effort: Pulmonary effort is normal.  Musculoskeletal:        General: Normal range of motion.     Cervical back: Normal range of motion.  Neurological:     General: No focal deficit present.     Mental Status: She is alert and oriented to person, place, and time.    Review of Systems  Psychiatric/Behavioral:  Positive for depression.   All other systems reviewed and are negative.  Blood pressure (!) 95/51, pulse 65, temperature 98.4 F (36.9 C), temperature source Oral, resp. rate 20, height 5\' 5"  (1.651 m), weight 73.9 kg, SpO2 97%. Body mass index is 27.12 kg/m.   Social History   Tobacco Use  Smoking Status Every Day   Current packs/day: 1.00   Average packs/day: 1 pack/day for 29.0 years (29.0 ttl pk-yrs)   Types: Cigarettes  Smokeless Tobacco Never   Tobacco Cessation:  A prescription for an FDA-approved tobacco cessation medication was offered at discharge and the patient refused   Blood Alcohol level:  Lab Results  Component Value Date   [facility] <10 08/14/2022   ETH <10 07/30/2020    Metabolic Disorder Labs:  Lab Results  Component Value Date   HGBA1C 8.4 (H) 02/07/2023   MPG 194.38 02/07/2023   MPG 269 08/27/2022   Lab Results  Component Value Date   PROLACTIN 10.6 08/31/2015   Lab Results  Component Value Date   CHOL 196 02/14/2023    TRIG 77 02/14/2023   HDL 47 02/14/2023   CHOLHDL 4.2 02/14/2023   VLDL 15 02/14/2023   LDLCALC 134 (H) 02/14/2023   LDLCALC 79 08/27/2022    See Psychiatric Specialty Exam and Suicide Risk Assessment completed by Attending Physician prior to discharge.  Discharge destination:  Home  Is patient on multiple antipsychotic therapies at discharge:  No   Has Patient had three or more failed trials of antipsychotic monotherapy by history:  No  Recommended Plan for Multiple Antipsychotic Therapies: NA  Discharge Instructions     Diet - low sodium heart healthy   Complete by: As directed    Discharge instructions   Complete by: As directed    Follow up with [facility]   Increase activity slowly   Complete by: As directed       Allergies as of 02/22/2023       Reactions   Tramadol Nausea Only, Rash   Benadryl [diphenhydramine] Other (See Comments)   Decreases motor skills and increases anxiety   Lactose Intolerance (gi)    Wellbutrin [bupropion] Other (See Comments)   rage   Nicotine Rash   Patient states she is allergic to the Nicotine patch and they cause her to break out in a rash.  She states she can smoke cigarettes.        Medication List     STOP taking these medications    acetaminophen 325 MG tablet Commonly known as: TYLENOL   blood glucose meter kit and supplies   nicotine polacrilex 2 MG gum Commonly known as: NICORETTE   nitroGLYCERIN 0.4 MG SL tablet Commonly known as: NITROSTAT   risperiDONE 2 MG tablet Commonly known as: RISPERDAL       TAKE these medications      Indication  bumetanide 1 MG tablet Commonly known as: BUMEX Take 1 tablet (1 mg total) by mouth daily. Start taking on: February 23, 2023 What changed: when to take this  Indication: Edema   Insulin Pen Needle 32G X 4 MM Misc Use As Directed  Indication: diabetes   Insulin Pen Needle 32G X 4 MM Misc Use 3 (three) times daily.  Indication: diabetes   ipratropium-albuterol  0.5-2.5 (3) MG/3ML Soln Commonly known as: DUONEB inhale 3 mLs by nebulization every 6 (six) hours as needed.  Indication: Chronic Obstructive Lung Disease   Lurasidone HCl 60 MG Tabs Take 1 tablet (60 mg total) by mouth daily with supper.  Indication: Depressive Phase of Manic-Depression   midodrine 5 MG tablet Commonly known as: PROAMATINE Take 1 tablet (5 mg total) by mouth 3 (three) times daily with meals.  Indication: Disorder of Low Blood Pressure   mometasone-formoterol 200-5 MCG/ACT Aero Commonly known as: DULERA Inhale 2 puffs into the lungs 2 (two) times daily.  Indication: Chronic Obstructive Lung Disease   OneTouch Delica Lancets 33G Misc Use 3 (three) times daily as directed to check blood sugar  Indication: diabetes   OneTouch Delica Plus Lancing Misc Use 3 (three) times daily.  Indication: diabetes   OneTouch Verio test strip Generic drug: glucose blood Use three times daily as directed to check blood sugar  Indication: daibetes   oxyCODONE-acetaminophen 10-325 MG tablet Commonly known as: PERCOCET Take 1 tablet by mouth every 6 (six) hours as needed for pain.  Indication: Pain   potassium chloride 10 MEQ tablet Commonly known as: KLOR-CON M Take 1 tablet (10 mEq total) by mouth daily.  Indication: Low Amount of Potassium in the Blood   rosuvastatin 20 MG tablet Commonly known as: CRESTOR Take 1 tablet (20 mg total) by mouth daily.  Indication: High Amount of Fats in the Blood   Toujeo SoloStar 300 UNIT/ML Solostar Pen Generic drug: insulin glargine (1 Unit Dial) Inject 28 Units into the skin at bedtime.  Indication: Insulin-Dependent Diabetes   Ventolin HFA 108 (90 Base) MCG/ACT inhaler Generic drug: albuterol Inhale 2 puffs into the lungs every 6 (six) hours as needed for wheezing or shortness of breath.  Indication: Chronic Obstructive Lung Disease   vitamin D3 25 MCG tablet Commonly known as: CHOLECALCIFEROL Take 1 tablet (1,000 Units  total) by mouth daily.  Indication: Vitamin D Deficiency        Follow-up Information     [facility] Follow up.   Why: Walk in hours are from 8AM to 2PM, Monday, Wednesday and Friday.  Please call to get scheduled. Contact information: [address] [place] [address] [phone]         [facility] .   Why: This agency accepts your insurance, please call to schedule a follow up appointment. Contact information: [address] [place] [address] [phone]         [facility] .   Why: This agency accepts your insurance, please call to schedule a follow up appointment. Contact information: [address] [place] [address] [phone]         [facility] Follow up.   Why: This agency accepts your insurance, please call to schedule a follow up appointment. Contact information: [address] [place] [address] [phone]                 Follow-up recommendations:   Activity:  as tolerated Diet:  heart healthy diet Bipolar 1 disorder - Latuda 60mg  oral daily  with supper   Anxiety -Hydroxyzine 25mg  oral 3 times daily PRN anxiety  Comments:  follow up with [facility]  Signed: Nanine Means, NP 02/22/2023, 2:56 PM

## 2023-02-22 NOTE — BHH Suicide Risk Assessment (Signed)
[facility] Discharge Suicide Risk Assessment   Principal Problem: Bipolar I disorder, most recent episode depressed [facility]) Discharge Diagnoses: Active Problems:   DM2 (diabetes mellitus, type 2) (HCC)   Chronic systolic CHF (congestive heart failure) (HCC)   COPD (chronic obstructive pulmonary disease) (HCC)   Prolonged QT interval   Hypotension   Total Time spent with patient: 45 minutes  Musculoskeletal: Strength & Muscle Tone: within normal limits Gait & Station: normal Patient leans: N/A  Psychiatric Specialty Exam: Physical Exam Vitals and nursing note reviewed.  Constitutional:      Appearance: Normal appearance.  HENT:     Head: Normocephalic.     Nose: Nose normal.  Pulmonary:     Effort: Pulmonary effort is normal.  Musculoskeletal:        General: Normal range of motion.     Cervical back: Normal range of motion.  Neurological:     General: No focal deficit present.     Mental Status: She is alert and oriented to person, place, and time.     Review of Systems  Psychiatric/Behavioral:  Positive for depression.   All other systems reviewed and are negative.   Blood pressure (!) 95/51, pulse 65, temperature 98.4 F (36.9 C), temperature source Oral, resp. rate 20, height 5\' 5"  (1.651 m), weight 73.9 kg, SpO2 97%.Body mass index is 27.12 kg/m.  General Appearance: Casual  Eye Contact:  Good  Speech:  Normal Rate  Volume:  Normal  Mood:  Depressed  Affect:  Congruent  Thought Process:  Coherent  Orientation:  Full (Time, Place, and Person)  Thought Content:  WDL and Logical  Suicidal Thoughts:  No  Homicidal Thoughts:  No  Memory:  Immediate;   Good Recent;   Good Remote;   Good  Judgement:  Good  Insight:  Good  Psychomotor Activity:  Normal  Concentration:  Concentration: Good and Attention Span: Good  Recall:  Good  Fund of Knowledge:  Good  Language:  Good  Akathisia:  No  Handed:  Right  AIMS (if indicated):     Assets:  Housing Leisure  Time Resilience Social Support  ADL's:  Intact  Cognition:  WNL  Sleep:        Physical Exam: Physical Exam Vitals and nursing note reviewed.  Constitutional:      Appearance: Normal appearance.  HENT:     Head: Normocephalic.     Nose: Nose normal.  Pulmonary:     Effort: Pulmonary effort is normal.  Musculoskeletal:        General: Normal range of motion.     Cervical back: Normal range of motion.  Neurological:     General: No focal deficit present.     Mental Status: She is alert and oriented to person, place, and time.    Review of Systems  Psychiatric/Behavioral:  Positive for depression.   All other systems reviewed and are negative.  Blood pressure (!) 95/51, pulse 65, temperature 98.4 F (36.9 C), temperature source Oral, resp. rate 20, height 5\' 5"  (1.651 m), weight 73.9 kg, SpO2 97%. Body mass index is 27.12 kg/m.  Mental Status Per Nursing Assessment::   On Admission:  Suicidal ideation indicated by patient, Suicidal ideation indicated by others  Demographic Factors:  Caucasian  Loss Factors: NA  Historical Factors: NA  Risk Reduction Factors:   Sense of responsibility to family, Living with another person, especially a relative, Positive social support, Positive therapeutic relationship, and Positive coping skills or problem solving skills  Continued Clinical Symptoms:  Mild to moderate depression  Cognitive Features That Contribute To Risk:  None    Suicide Risk:  Minimal: No identifiable suicidal ideation.  Patients presenting with no risk factors but with morbid ruminations; may be classified as minimal risk based on the severity of the depressive symptoms   Follow-up Information     [facility] Follow up.   Why: Walk in hours are from 8AM to 2PM, Monday, Wednesday and Friday.  Please call to get scheduled. Contact information: [address] [place] [address] [phone]         [facility] .   Why: This agency accepts your insurance, please call to schedule a follow up appointment. Contact information: [address] [place] [address] [phone]         [facility] .   Why: This agency accepts your insurance, please call to schedule a follow up appointment. Contact information: [address] [place] [address] [phone]         [facility] Follow up.   Why: This agency accepts your insurance, please call to schedule a follow up appointment. Contact information: [address] [place] [address] [phone]                 Plan Of Care/Follow-up recommendations:  Activity:  as tolerated Diet:  heart healthy diet Bipolar 1 disorder - Latuda 60mg  oral daily with supper   Anxiety -Hydroxyzine 25mg  oral 3 times daily PRN anxiety  Nanine Means, NP 02/22/2023, 2:52 PM

## 2023-02-24 ENCOUNTER — Other Ambulatory Visit (HOSPITAL_COMMUNITY): Payer: Self-pay

## 2023-02-24 ENCOUNTER — Telehealth (HOSPITAL_COMMUNITY): Payer: Self-pay | Admitting: Licensed Clinical Social Worker

## 2023-02-24 NOTE — Telephone Encounter (Signed)
CSW attempted to call pt to check in after DC from [facility] over the weekend and to try and get reestablished with [facility]  CSW unable to reach or leave a VM- sent text message requesting return call  Burna Sis, LCSW Clinical Social Worker [facility] Desk#: [phone] Cell#: [phone]

## 2023-02-25 ENCOUNTER — Encounter: Payer: BLUE CROSS/BLUE SHIELD | Admitting: Student

## 2023-03-05 ENCOUNTER — Telehealth (HOSPITAL_COMMUNITY): Payer: Self-pay | Admitting: Licensed Clinical Social Worker

## 2023-03-05 NOTE — Telephone Encounter (Signed)
CSW attempted to reach patient to discuss additional minutes on phone with no answer and unable to leave message. Lasandra Beech, LCSW, CCSW-MCS [phone]

## 2023-03-06 ENCOUNTER — Other Ambulatory Visit (HOSPITAL_COMMUNITY): Payer: Self-pay

## 2023-03-13 ENCOUNTER — Other Ambulatory Visit (HOSPITAL_COMMUNITY): Payer: Self-pay

## 2023-05-14 ENCOUNTER — Emergency Department (HOSPITAL_COMMUNITY): Payer: BLUE CROSS/BLUE SHIELD

## 2023-05-14 ENCOUNTER — Emergency Department (HOSPITAL_COMMUNITY)
Admission: EM | Admit: 2023-05-14 | Discharge: 2023-05-14 | Disposition: A | Discharge status: Home / Self Care | Payer: BLUE CROSS/BLUE SHIELD | Attending: Emergency Medicine | Admitting: Emergency Medicine

## 2023-05-14 ENCOUNTER — Other Ambulatory Visit: Payer: Self-pay

## 2023-05-14 ENCOUNTER — Encounter (HOSPITAL_COMMUNITY): Payer: Self-pay

## 2023-05-14 DIAGNOSIS — R0602 Shortness of breath: Secondary | ICD-10-CM | POA: Diagnosis present

## 2023-05-14 DIAGNOSIS — J22 Unspecified acute lower respiratory infection: Secondary | ICD-10-CM | POA: Diagnosis not present

## 2023-05-14 DIAGNOSIS — R079 Chest pain, unspecified: Secondary | ICD-10-CM

## 2023-05-14 DIAGNOSIS — Z794 Long term (current) use of insulin: Secondary | ICD-10-CM | POA: Diagnosis not present

## 2023-05-14 DIAGNOSIS — E876 Hypokalemia: Secondary | ICD-10-CM | POA: Insufficient documentation

## 2023-05-14 DIAGNOSIS — J449 Chronic obstructive pulmonary disease, unspecified: Secondary | ICD-10-CM | POA: Insufficient documentation

## 2023-05-14 DIAGNOSIS — Z91199 Patient's noncompliance with other medical treatment and regimen due to unspecified reason: Secondary | ICD-10-CM

## 2023-05-14 DIAGNOSIS — E8809 Other disorders of plasma-protein metabolism, not elsewhere classified: Secondary | ICD-10-CM | POA: Diagnosis not present

## 2023-05-14 DIAGNOSIS — Z20822 Contact with and (suspected) exposure to covid-19: Secondary | ICD-10-CM | POA: Insufficient documentation

## 2023-05-14 DIAGNOSIS — E1165 Type 2 diabetes mellitus with hyperglycemia: Secondary | ICD-10-CM | POA: Diagnosis not present

## 2023-05-14 DIAGNOSIS — I509 Heart failure, unspecified: Secondary | ICD-10-CM | POA: Insufficient documentation

## 2023-05-14 LAB — HEPATIC FUNCTION PANEL
ALT: 8 U/L (ref 0–44)
AST: 11 U/L — ABNORMAL LOW (ref 15–41)
Albumin: 3.5 g/dL (ref 3.5–5.0)
Alkaline Phosphatase: 70 U/L (ref 38–126)
Bilirubin, Direct: 0.1 mg/dL (ref 0.0–0.2)
Indirect Bilirubin: 1.2 mg/dL — ABNORMAL HIGH (ref 0.3–0.9)
Total Bilirubin: 1.3 mg/dL — ABNORMAL HIGH (ref <1.2)
Total Protein: 6 g/dL — ABNORMAL LOW (ref 6.5–8.1)

## 2023-05-14 LAB — I-STAT CHEM 8, ED
BUN: 18 mg/dL (ref 6–20)
Calcium, Ion: 1.1 mmol/L — ABNORMAL LOW (ref 1.15–1.40)
Chloride: 106 mmol/L (ref 98–111)
Creatinine, Ser: 1.1 mg/dL — ABNORMAL HIGH (ref 0.44–1.00)
Glucose, Bld: 226 mg/dL — ABNORMAL HIGH (ref 70–99)
HCT: 37 % (ref 36.0–46.0)
Hemoglobin: 12.6 g/dL (ref 12.0–15.0)
Potassium: 3.6 mmol/L (ref 3.5–5.1)
Sodium: 141 mmol/L (ref 135–145)
TCO2: 20 mmol/L — ABNORMAL LOW (ref 22–32)

## 2023-05-14 LAB — RESP PANEL BY RT-PCR (RSV, FLU A&B, COVID)  RVPGX2
Influenza A by PCR: NEGATIVE
Influenza B by PCR: NEGATIVE
Resp Syncytial Virus by PCR: NEGATIVE
SARS Coronavirus 2 by RT PCR: NEGATIVE

## 2023-05-14 LAB — CBC
HCT: 39.1 % (ref 36.0–46.0)
Hemoglobin: 12.8 g/dL (ref 12.0–15.0)
MCH: 28.6 pg (ref 26.0–34.0)
MCHC: 32.7 g/dL (ref 30.0–36.0)
MCV: 87.3 fL (ref 80.0–100.0)
Platelets: 207 10*3/uL (ref 150–400)
RBC: 4.48 MIL/uL (ref 3.87–5.11)
RDW: 14.5 % (ref 11.5–15.5)
WBC: 8.2 10*3/uL (ref 4.0–10.5)
nRBC: 0 % (ref 0.0–0.2)

## 2023-05-14 LAB — BASIC METABOLIC PANEL
Anion gap: 13 (ref 5–15)
BUN: 17 mg/dL (ref 6–20)
CO2: 19 mmol/L — ABNORMAL LOW (ref 22–32)
Calcium: 8.8 mg/dL — ABNORMAL LOW (ref 8.9–10.3)
Chloride: 107 mmol/L (ref 98–111)
Creatinine, Ser: 1.06 mg/dL — ABNORMAL HIGH (ref 0.44–1.00)
GFR, Estimated: >60 mL/min (ref >60)
Glucose, Bld: 278 mg/dL — ABNORMAL HIGH (ref 70–99)
Potassium: 3.3 mmol/L — ABNORMAL LOW (ref 3.5–5.1)
Sodium: 139 mmol/L (ref 135–145)

## 2023-05-14 LAB — BRAIN NATRIURETIC PEPTIDE: B Natriuretic Peptide: 723.5 pg/mL — ABNORMAL HIGH (ref 0.0–100.0)

## 2023-05-14 LAB — TROPONIN I (HIGH SENSITIVITY)
Troponin I (High Sensitivity): 30 ng/L — ABNORMAL HIGH (ref <18)
Troponin I (High Sensitivity): 33 ng/L — ABNORMAL HIGH (ref <18)

## 2023-05-14 LAB — TSH: TSH: 1.129 u[IU]/mL (ref 0.350–4.500)

## 2023-05-14 MED ORDER — METHYLPREDNISOLONE SODIUM SUCC 125 MG IJ SOLR
125.0000 mg | INTRAMUSCULAR | Status: AC
  Administered 2023-05-14: 125 mg via INTRAVENOUS
  Filled 2023-05-14: qty 2

## 2023-05-14 MED ORDER — SODIUM CHLORIDE 0.9 % IV BOLUS
500.0000 mL | Freq: Once | INTRAVENOUS | Status: AC
  Administered 2023-05-14: 500 mL via INTRAVENOUS

## 2023-05-14 MED ORDER — ALBUTEROL SULFATE HFA 108 (90 BASE) MCG/ACT IN AERS: 2.0000 | INHALATION_SPRAY | RESPIRATORY_TRACT | Status: DC | PRN

## 2023-05-14 MED ORDER — OXYCODONE-ACETAMINOPHEN 5-325 MG PO TABS
1.0000 | ORAL_TABLET | Freq: Once | ORAL | Status: AC
  Administered 2023-05-14: 1 via ORAL
  Filled 2023-05-14: qty 1

## 2023-05-14 MED ORDER — DOXYCYCLINE HYCLATE 100 MG PO CAPS
100.0000 mg | ORAL_CAPSULE | Freq: Two times a day (BID) | ORAL | 0 refills | Status: AC
  Filled 2023-05-14: qty 10, 5d supply, fill #0

## 2023-05-14 MED ORDER — IPRATROPIUM-ALBUTEROL 0.5-2.5 (3) MG/3ML IN SOLN
3.0000 mL | Freq: Once | RESPIRATORY_TRACT | Status: AC
  Administered 2023-05-14: 3 mL via RESPIRATORY_TRACT
  Filled 2023-05-14: qty 3

## 2023-05-14 MED ORDER — IOHEXOL 350 MG/ML SOLN
65.0000 mL | Freq: Once | INTRAVENOUS | Status: AC | PRN
  Administered 2023-05-14: 65 mL via INTRAVENOUS

## 2023-05-14 NOTE — Discharge Instructions (Addendum)
Please follow up with your cardiologist. You still have the daily medications available with the pharmacy. Please pick them up. Your XR shows some possible signs of infection in your lungs. I will discharge you home with an antibiotic. Please take as prescribed. If you have any concerns, new or worsening symptoms, please return to the ER for re-evaluation.   Contact a doctor if: Your chest pain does not go away. You feel depressed. You have a fever. Get help right away if: Your chest pain is worse. You have a cough that gets worse, or you cough up blood. You have very bad (severe) pain in your belly (abdomen). You pass out (faint). You have either of these for no clear reason: Sudden chest discomfort. Sudden discomfort in your arms, back, neck, or jaw. You have shortness of breath at any time. You suddenly start to sweat, or your skin gets clammy. You feel sick to your stomach (nauseous). You throw up (vomit). You suddenly feel lightheaded or dizzy. You feel very weak or tired. Your heart starts to beat fast, or it feels like it is skipping beats. These symptoms may be an emergency. Do not wait to see if the symptoms will go away. Get medical help right away. Call your local emergency services (911 in the [place] Do not drive yourself to the hospital.

## 2023-05-14 NOTE — ED Triage Notes (Signed)
Pt BIBGEMS from home with cp that feels like squeezing and stabbing sensation. Breathing has been short of breath for a few weeks unable to get lasix at home but have been taking a friends medications.   324 ASA 1 Nitroglycerin on board.  20 g LAC

## 2023-05-14 NOTE — ED Provider Notes (Signed)
[place] EMERGENCY DEPARTMENT AT [facility] Provider Note   CSN: [id number] Arrival date & time: 05/14/23  1538     History Chief Complaint  Patient presents with   Shortness of Breath   Chest Pain    [name] [name] is a 52 y.o. female.   Shortness of Breath Associated symptoms: chest pain   Chest Pain Associated symptoms: shortness of breath        Home Medications Prior to Admission medications   Medication Sig Start Date End Date Taking? Authorizing Provider  albuterol (VENTOLIN HFA) 108 (90 Base) MCG/ACT inhaler Inhale 2 puffs into the lungs every 6 (six) hours as needed for wheezing or shortness of breath. 09/27/22   Arrien, York Ram, MD  bumetanide (BUMEX) 1 MG tablet Take 1 tablet (1 mg total) by mouth daily. 02/23/23 03/25/23  Charm Rings, NP  vitamin D3 (CHOLECALCIFEROL) 25 MCG tablet Take 1 tablet (1,000 Units total) by mouth daily. 02/13/23   Alexander-Savino, [place], MD  glucose blood (ONETOUCH VERIO) test strip Use three times daily as directed to check blood sugar 02/11/23   Alexander-Savino, [place], MD  insulin glargine, 1 Unit Dial, (TOUJEO SOLOSTAR) 300 UNIT/ML Solostar Pen Inject 28 Units into the skin at bedtime. 02/12/23   Hassan Rowan, [place], MD  Insulin Pen Needle 32G X 4 MM MISC Use As Directed 09/30/22   Arrien, York Ram, MD  Insulin Pen Needle 32G X 4 MM MISC Use 3 (three) times daily. 02/11/23   Alexander-Savino, [place], MD  ipratropium-albuterol (DUONEB) 0.5-2.5 (3) MG/3ML SOLN inhale 3 mLs by nebulization every 6 (six) hours as needed. 10/11/22   Alen Bleacher, NP  Lancet Devices [facility] DELICA PLUS LANCING) MISC Use 3 (three) times daily. 02/11/23   Alexander-Savino, [place], MD  Lurasidone HCl 60 MG TABS Take 1 tablet (60 mg total) by mouth daily with supper. 02/22/23 03/24/23  Charm Rings, NP  midodrine (PROAMATINE) 5 MG tablet Take 1 tablet (5 mg total) by mouth 3 (three) times daily with meals. 02/12/23    Alexander-Savino, [place], MD  mometasone-formoterol [facility]) 200-5 MCG/ACT AERO Inhale 2 puffs into the lungs 2 (two) times daily. 02/11/23   Carmina Miller, DO  OneTouch Delica Lancets 33G MISC Use 3 (three) times daily as directed to check blood sugar 02/11/23   Alexander-Savino, [place], MD  oxyCODONE-acetaminophen (PERCOCET) 10-325 MG tablet Take 1 tablet by mouth every 6 (six) hours as needed for pain. 09/02/22   Wouk, Wilfred Curtis, MD  potassium chloride (KLOR-CON M) 10 MEQ tablet Take 1 tablet (10 mEq total) by mouth daily. Patient not taking: Reported on 02/06/2023 10/11/22 07/08/23  Alen Bleacher, NP  rosuvastatin (CRESTOR) 20 MG tablet Take 1 tablet (20 mg total) by mouth daily. 02/11/23 03/13/23  Hassan Rowan, [place], MD  benztropine (COGENTIN) 1 MG tablet Take 1 tablet (1 mg total) by mouth 2 (two) times daily. 12/23/18 06/04/20  Malvin Johns, MD  insulin aspart (NOVOLOG) 100 UNIT/ML injection Inject 0-9 Units into the skin 3 (three) times daily with meals. Patient not taking: Reported on 06/03/2020 12/16/18 06/04/20  Lanae Boast, MD      Allergies    Tramadol, Benadryl [diphenhydramine], Lactose intolerance (gi), Wellbutrin [bupropion], and Nicotine    Review of Systems   Review of Systems  Respiratory:  Positive for shortness of breath.   Cardiovascular:  Positive for chest pain.    Physical Exam Updated Vital Signs BP (!) 148/102   Pulse 88   Temp (!) 97.5 F (36.4  C) (Oral)   Resp 19   Ht 5\' 5"  (1.651 m)   Wt 73.9 kg   LMP  (LMP Unknown) Comment: Pt is poor historian  SpO2 97%   BMI 27.12 kg/m  Physical Exam  ED Results / Procedures / Treatments   Labs (all labs ordered are listed, but only abnormal results are displayed) Labs Reviewed  BASIC METABOLIC PANEL  CBC  BRAIN NATRIURETIC PEPTIDE  TROPONIN I (HIGH SENSITIVITY)    EKG None  Radiology No results found.  Procedures Procedures   Medications Ordered in ED Medications  albuterol (VENTOLIN HFA) 108  (90 Base) MCG/ACT inhaler 2 puff (has no administration in time range)    ED Course/ Medical Decision Making/ A&P Clinical Course as of 05/14/23 2216  Wed May 14, 2023  1735 Labs are still in process. [RR]  1749 Attending recommends 500cc bolus of fluids [RR]    Clinical Course User Index [RR] Achille Rich, PA-C                               Medical Decision Making Amount and/or Complexity of Data Reviewed Labs: ordered. Radiology: ordered.  Risk Prescription drug management.   52 y.o. female presents to the ER for evaluation of ***. Differential diagnosis includes but is not limited to ***. Vital signs ***. Physical exam as noted above.   I independently reviewed and interpreted the patient's labs. ***.  After consideration of the diagnostic results and the patients response to treatment, I feel that *** .   Emergency department workup does*** not suggest an emergent condition requiring admission or immediate intervention beyond what has been performed at this time.   ***We discussed the results of the labs/imaging. The plan is ***. We discussed strict return precautions and red flag symptoms. The patient verbalized their understanding and agrees to the plan. The patient is stable and being discharged home in good condition.  ***Portions of this report may have been transcribed using voice recognition software. Every effort was made to ensure accuracy; however, inadvertent computerized transcription errors may be present.   Final Clinical Impression(s) / ED Diagnoses Final diagnoses:  None    Rx / DC Orders ED Discharge Orders     None

## 2023-05-14 NOTE — ED Notes (Signed)
Pt ambulated to restroom. 

## 2023-05-14 NOTE — ED Notes (Signed)
Pt unable to keep her eyes open at this time during conversation.

## 2023-05-15 ENCOUNTER — Other Ambulatory Visit (HOSPITAL_COMMUNITY): Payer: Self-pay

## 2023-05-15 LAB — T3: T3, Total: 94 ng/dL (ref 71–180)

## 2023-05-22 ENCOUNTER — Emergency Department (HOSPITAL_COMMUNITY): Payer: BLUE CROSS/BLUE SHIELD

## 2023-05-22 ENCOUNTER — Other Ambulatory Visit: Payer: Self-pay

## 2023-05-22 ENCOUNTER — Inpatient Hospital Stay (HOSPITAL_COMMUNITY)
Admission: EM | Admit: 2023-05-22 | Discharge: 2023-05-27 | DRG: 291 | Disposition: A | Discharge status: Home / Self Care | Payer: BLUE CROSS/BLUE SHIELD | Attending: Internal Medicine | Admitting: Internal Medicine

## 2023-05-22 DIAGNOSIS — R079 Chest pain, unspecified: Secondary | ICD-10-CM | POA: Diagnosis present

## 2023-05-22 DIAGNOSIS — Z885 Allergy status to narcotic agent status: Secondary | ICD-10-CM

## 2023-05-22 DIAGNOSIS — F319 Bipolar disorder, unspecified: Secondary | ICD-10-CM | POA: Diagnosis present

## 2023-05-22 DIAGNOSIS — I959 Hypotension, unspecified: Secondary | ICD-10-CM | POA: Diagnosis not present

## 2023-05-22 DIAGNOSIS — E1165 Type 2 diabetes mellitus with hyperglycemia: Secondary | ICD-10-CM | POA: Diagnosis present

## 2023-05-22 DIAGNOSIS — E119 Type 2 diabetes mellitus without complications: Secondary | ICD-10-CM

## 2023-05-22 DIAGNOSIS — Z91199 Patient's noncompliance with other medical treatment and regimen due to unspecified reason: Secondary | ICD-10-CM

## 2023-05-22 DIAGNOSIS — G894 Chronic pain syndrome: Secondary | ICD-10-CM | POA: Diagnosis present

## 2023-05-22 DIAGNOSIS — I130 Hypertensive heart and chronic kidney disease with heart failure and stage 1 through stage 4 chronic kidney disease, or unspecified chronic kidney disease: Principal | ICD-10-CM | POA: Diagnosis present

## 2023-05-22 DIAGNOSIS — Z8249 Family history of ischemic heart disease and other diseases of the circulatory system: Secondary | ICD-10-CM

## 2023-05-22 DIAGNOSIS — R9431 Abnormal electrocardiogram [ECG] [EKG]: Secondary | ICD-10-CM | POA: Diagnosis present

## 2023-05-22 DIAGNOSIS — N179 Acute kidney failure, unspecified: Secondary | ICD-10-CM | POA: Diagnosis present

## 2023-05-22 DIAGNOSIS — I509 Heart failure, unspecified: Principal | ICD-10-CM | POA: Insufficient documentation

## 2023-05-22 DIAGNOSIS — E8721 Acute metabolic acidosis: Secondary | ICD-10-CM | POA: Diagnosis present

## 2023-05-22 DIAGNOSIS — I5043 Acute on chronic combined systolic (congestive) and diastolic (congestive) heart failure: Secondary | ICD-10-CM

## 2023-05-22 DIAGNOSIS — Z888 Allergy status to other drugs, medicaments and biological substances status: Secondary | ICD-10-CM

## 2023-05-22 DIAGNOSIS — J449 Chronic obstructive pulmonary disease, unspecified: Secondary | ICD-10-CM | POA: Diagnosis present

## 2023-05-22 DIAGNOSIS — J4489 Other specified chronic obstructive pulmonary disease: Secondary | ICD-10-CM | POA: Diagnosis present

## 2023-05-22 DIAGNOSIS — I5023 Acute on chronic systolic (congestive) heart failure: Secondary | ICD-10-CM | POA: Diagnosis present

## 2023-05-22 DIAGNOSIS — E739 Lactose intolerance, unspecified: Secondary | ICD-10-CM | POA: Diagnosis present

## 2023-05-22 DIAGNOSIS — E785 Hyperlipidemia, unspecified: Secondary | ICD-10-CM | POA: Diagnosis present

## 2023-05-22 DIAGNOSIS — F419 Anxiety disorder, unspecified: Secondary | ICD-10-CM | POA: Diagnosis present

## 2023-05-22 DIAGNOSIS — Z794 Long term (current) use of insulin: Secondary | ICD-10-CM

## 2023-05-22 DIAGNOSIS — Z818 Family history of other mental and behavioral disorders: Secondary | ICD-10-CM

## 2023-05-22 DIAGNOSIS — F17210 Nicotine dependence, cigarettes, uncomplicated: Secondary | ICD-10-CM | POA: Diagnosis present

## 2023-05-22 DIAGNOSIS — N1831 Chronic kidney disease, stage 3a: Secondary | ICD-10-CM | POA: Diagnosis present

## 2023-05-22 DIAGNOSIS — E1122 Type 2 diabetes mellitus with diabetic chronic kidney disease: Secondary | ICD-10-CM | POA: Diagnosis present

## 2023-05-22 DIAGNOSIS — G4700 Insomnia, unspecified: Secondary | ICD-10-CM | POA: Diagnosis present

## 2023-05-22 DIAGNOSIS — I10 Essential (primary) hypertension: Secondary | ICD-10-CM | POA: Diagnosis present

## 2023-05-22 DIAGNOSIS — Z79899 Other long term (current) drug therapy: Secondary | ICD-10-CM

## 2023-05-22 DIAGNOSIS — F209 Schizophrenia, unspecified: Secondary | ICD-10-CM | POA: Diagnosis present

## 2023-05-22 DIAGNOSIS — Z833 Family history of diabetes mellitus: Secondary | ICD-10-CM

## 2023-05-22 DIAGNOSIS — E876 Hypokalemia: Secondary | ICD-10-CM | POA: Diagnosis present

## 2023-05-22 DIAGNOSIS — I340 Nonrheumatic mitral (valve) insufficiency: Secondary | ICD-10-CM | POA: Diagnosis present

## 2023-05-22 LAB — HEPATIC FUNCTION PANEL
ALT: 32 U/L (ref 0–44)
AST: 26 U/L (ref 15–41)
Albumin: 2.9 g/dL — ABNORMAL LOW (ref 3.5–5.0)
Alkaline Phosphatase: 105 U/L (ref 38–126)
Bilirubin, Direct: 0.3 mg/dL — ABNORMAL HIGH (ref 0.0–0.2)
Indirect Bilirubin: 0.7 mg/dL (ref 0.3–0.9)
Total Bilirubin: 1 mg/dL (ref <1.2)
Total Protein: 5.5 g/dL — ABNORMAL LOW (ref 6.5–8.1)

## 2023-05-22 LAB — BASIC METABOLIC PANEL
Anion gap: 12 (ref 5–15)
BUN: 14 mg/dL (ref 6–20)
CO2: 19 mmol/L — ABNORMAL LOW (ref 22–32)
Calcium: 7.8 mg/dL — ABNORMAL LOW (ref 8.9–10.3)
Chloride: 112 mmol/L — ABNORMAL HIGH (ref 98–111)
Creatinine, Ser: 0.95 mg/dL (ref 0.44–1.00)
GFR, Estimated: >60 mL/min (ref >60)
Glucose, Bld: 396 mg/dL — ABNORMAL HIGH (ref 70–99)
Potassium: 3.7 mmol/L (ref 3.5–5.1)
Sodium: 143 mmol/L (ref 135–145)

## 2023-05-22 LAB — CREATININE, SERUM
Creatinine, Ser: 1.18 mg/dL — ABNORMAL HIGH (ref 0.44–1.00)
GFR, Estimated: 55 mL/min — ABNORMAL LOW (ref >60)

## 2023-05-22 LAB — MAGNESIUM: Magnesium: 1.6 mg/dL — ABNORMAL LOW (ref 1.7–2.4)

## 2023-05-22 LAB — CBC
HCT: 33.4 % — ABNORMAL LOW (ref 36.0–46.0)
HCT: 33.3 % — ABNORMAL LOW (ref 36.0–46.0)
Hemoglobin: 10.5 g/dL — ABNORMAL LOW (ref 12.0–15.0)
Hemoglobin: 10.6 g/dL — ABNORMAL LOW (ref 12.0–15.0)
MCH: 28.8 pg (ref 26.0–34.0)
MCH: 29.3 pg (ref 26.0–34.0)
MCHC: 31.4 g/dL (ref 30.0–36.0)
MCHC: 31.8 g/dL (ref 30.0–36.0)
MCV: 91.5 fL (ref 80.0–100.0)
MCV: 92 fL (ref 80.0–100.0)
Platelets: 169 10*3/uL (ref 150–400)
Platelets: 185 10*3/uL (ref 150–400)
RBC: 3.65 MIL/uL — ABNORMAL LOW (ref 3.87–5.11)
RBC: 3.62 MIL/uL — ABNORMAL LOW (ref 3.87–5.11)
RDW: 15.7 % — ABNORMAL HIGH (ref 11.5–15.5)
RDW: 15.8 % — ABNORMAL HIGH (ref 11.5–15.5)
WBC: 7.4 10*3/uL (ref 4.0–10.5)
WBC: 6.1 10*3/uL (ref 4.0–10.5)
nRBC: 0.4 % — ABNORMAL HIGH (ref 0.0–0.2)
nRBC: 1 % — ABNORMAL HIGH (ref 0.0–0.2)

## 2023-05-22 LAB — HEMOGLOBIN A1C
Hgb A1c MFr Bld: 8.2 % — ABNORMAL HIGH (ref 4.8–5.6)
Mean Plasma Glucose: 188.64 mg/dL

## 2023-05-22 LAB — TSH: TSH: 0.837 u[IU]/mL (ref 0.350–4.500)

## 2023-05-22 LAB — BRAIN NATRIURETIC PEPTIDE: B Natriuretic Peptide: 2978.4 pg/mL — ABNORMAL HIGH (ref 0.0–100.0)

## 2023-05-22 LAB — TROPONIN I (HIGH SENSITIVITY)
Troponin I (High Sensitivity): 26 ng/L — ABNORMAL HIGH (ref <18)
Troponin I (High Sensitivity): 29 ng/L — ABNORMAL HIGH (ref <18)

## 2023-05-22 LAB — GLUCOSE, CAPILLARY: Glucose-Capillary: 348 mg/dL — ABNORMAL HIGH (ref 70–99)

## 2023-05-22 MED ORDER — ROSUVASTATIN CALCIUM 20 MG PO TABS
20.0000 mg | ORAL_TABLET | Freq: Every day | ORAL | Status: DC
Start: 2023-05-22 — End: 2023-05-27
  Administered 2023-05-22 – 2023-05-27 (×6): 20 mg via ORAL
  Filled 2023-05-22 (×6): qty 1

## 2023-05-22 MED ORDER — FUROSEMIDE 10 MG/ML IJ SOLN
40.0000 mg | Freq: Two times a day (BID) | INTRAMUSCULAR | Status: DC
  Administered 2023-05-23: 40 mg via INTRAVENOUS
  Filled 2023-05-22 (×2): qty 4

## 2023-05-22 MED ORDER — ASPIRIN 81 MG PO CHEW
324.0000 mg | CHEWABLE_TABLET | Freq: Once | ORAL | Status: AC
  Administered 2023-05-22: 324 mg via ORAL
  Filled 2023-05-22: qty 4

## 2023-05-22 MED ORDER — OXYCODONE HCL 5 MG PO TABS
5.0000 mg | ORAL_TABLET | Freq: Four times a day (QID) | ORAL | Status: DC | PRN
  Administered 2023-05-22 – 2023-05-25 (×12): 5 mg via ORAL
  Filled 2023-05-22 (×13): qty 1

## 2023-05-22 MED ORDER — HYDROMORPHONE HCL 1 MG/ML IJ SOLN
0.5000 mg | INTRAMUSCULAR | Status: AC | PRN
  Administered 2023-05-22 – 2023-05-23 (×2): 0.5 mg via INTRAVENOUS
  Filled 2023-05-22 (×2): qty 0.5

## 2023-05-22 MED ORDER — ACETAMINOPHEN 650 MG RE SUPP: 650.0000 mg | Freq: Four times a day (QID) | RECTAL | Status: DC | PRN

## 2023-05-22 MED ORDER — ACETAMINOPHEN 325 MG PO TABS: 650.0000 mg | ORAL_TABLET | Freq: Four times a day (QID) | ORAL | Status: DC | PRN

## 2023-05-22 MED ORDER — MAGNESIUM SULFATE 2 GM/50ML IV SOLN: 2.0000 g | Freq: Once | INTRAVENOUS | Status: DC

## 2023-05-22 MED ORDER — SERTRALINE HCL 100 MG PO TABS
100.0000 mg | ORAL_TABLET | Freq: Every day | ORAL | Status: DC
Start: 2023-05-22 — End: 2023-05-27
  Administered 2023-05-22 – 2023-05-27 (×6): 100 mg via ORAL
  Filled 2023-05-22 (×6): qty 1

## 2023-05-22 MED ORDER — ENOXAPARIN SODIUM 40 MG/0.4ML IJ SOSY
40.0000 mg | PREFILLED_SYRINGE | INTRAMUSCULAR | Status: DC
  Administered 2023-05-22 – 2023-05-25 (×4): 40 mg via SUBCUTANEOUS
  Filled 2023-05-22 (×5): qty 0.4

## 2023-05-22 MED ORDER — ONDANSETRON HCL 4 MG/2ML IJ SOLN
4.0000 mg | Freq: Once | INTRAMUSCULAR | Status: AC
  Administered 2023-05-22: 4 mg via INTRAVENOUS
  Filled 2023-05-22: qty 2

## 2023-05-22 MED ORDER — ALBUTEROL SULFATE (2.5 MG/3ML) 0.083% IN NEBU
3.0000 mL | INHALATION_SOLUTION | Freq: Four times a day (QID) | RESPIRATORY_TRACT | Status: DC | PRN
  Administered 2023-05-23: 3 mL via RESPIRATORY_TRACT
  Filled 2023-05-22: qty 3

## 2023-05-22 MED ORDER — FUROSEMIDE 10 MG/ML IJ SOLN
40.0000 mg | Freq: Once | INTRAMUSCULAR | Status: AC
  Administered 2023-05-22: 40 mg via INTRAVENOUS
  Filled 2023-05-22: qty 4

## 2023-05-22 MED ORDER — MOMETASONE FURO-FORMOTEROL FUM 200-5 MCG/ACT IN AERO
2.0000 | INHALATION_SPRAY | Freq: Two times a day (BID) | RESPIRATORY_TRACT | Status: DC
  Administered 2023-05-23 – 2023-05-27 (×9): 2 via RESPIRATORY_TRACT
  Filled 2023-05-22 (×2): qty 8.8

## 2023-05-22 MED ORDER — OXYCODONE-ACETAMINOPHEN 10-325 MG PO TABS: 1.0000 | ORAL_TABLET | Freq: Four times a day (QID) | ORAL | Status: DC | PRN

## 2023-05-22 MED ORDER — FENTANYL CITRATE PF 50 MCG/ML IJ SOSY
50.0000 ug | PREFILLED_SYRINGE | Freq: Once | INTRAMUSCULAR | Status: AC
  Administered 2023-05-22: 50 ug via INTRAVENOUS
  Filled 2023-05-22: qty 1

## 2023-05-22 MED ORDER — INSULIN ASPART 100 UNIT/ML IJ SOLN
0.0000 [IU] | Freq: Every day | INTRAMUSCULAR | Status: DC
  Administered 2023-05-22: 4 [IU] via SUBCUTANEOUS
  Administered 2023-05-24: 2 [IU] via SUBCUTANEOUS

## 2023-05-22 MED ORDER — VITAMIN D 25 MCG (1000 UNIT) PO TABS
1000.0000 [IU] | ORAL_TABLET | Freq: Every day | ORAL | Status: DC
  Administered 2023-05-22 – 2023-05-27 (×6): 1000 [IU] via ORAL
  Filled 2023-05-22 (×6): qty 1

## 2023-05-22 MED ORDER — INSULIN ASPART 100 UNIT/ML IJ SOLN
0.0000 [IU] | Freq: Three times a day (TID) | INTRAMUSCULAR | Status: DC
  Administered 2023-05-23: 5 [IU] via SUBCUTANEOUS
  Administered 2023-05-23 (×2): 3 [IU] via SUBCUTANEOUS
  Administered 2023-05-24: 2 [IU] via SUBCUTANEOUS
  Administered 2023-05-24: 3 [IU] via SUBCUTANEOUS
  Administered 2023-05-24: 8 [IU] via SUBCUTANEOUS
  Administered 2023-05-25 (×3): 5 [IU] via SUBCUTANEOUS
  Administered 2023-05-26: 2 [IU] via SUBCUTANEOUS
  Administered 2023-05-26: 8 [IU] via SUBCUTANEOUS
  Administered 2023-05-26: 2 [IU] via SUBCUTANEOUS
  Administered 2023-05-27: 3 [IU] via SUBCUTANEOUS
  Administered 2023-05-27: 8 [IU] via SUBCUTANEOUS

## 2023-05-22 MED ORDER — MIDODRINE HCL 5 MG PO TABS: 5.0000 mg | ORAL_TABLET | Freq: Three times a day (TID) | ORAL | Status: DC

## 2023-05-22 MED ORDER — MAGNESIUM SULFATE 2 GM/50ML IV SOLN
2.0000 g | Freq: Once | INTRAVENOUS | Status: AC
  Administered 2023-05-22: 2 g via INTRAVENOUS
  Filled 2023-05-22: qty 50

## 2023-05-22 MED ORDER — PROCHLORPERAZINE EDISYLATE 10 MG/2ML IJ SOLN
5.0000 mg | Freq: Four times a day (QID) | INTRAMUSCULAR | Status: DC | PRN
  Administered 2023-05-22 – 2023-05-27 (×8): 5 mg via INTRAVENOUS
  Filled 2023-05-22 (×8): qty 2

## 2023-05-22 MED ORDER — INSULIN GLARGINE-YFGN 100 UNIT/ML ~~LOC~~ SOLN
20.0000 [IU] | Freq: Every day | SUBCUTANEOUS | Status: DC
  Administered 2023-05-22: 20 [IU] via SUBCUTANEOUS
  Filled 2023-05-22 (×3): qty 0.2

## 2023-05-22 MED ORDER — OXYCODONE-ACETAMINOPHEN 5-325 MG PO TABS
1.0000 | ORAL_TABLET | Freq: Four times a day (QID) | ORAL | Status: DC | PRN
  Administered 2023-05-22 – 2023-05-25 (×12): 1 via ORAL
  Filled 2023-05-22 (×12): qty 1

## 2023-05-22 MED ORDER — INSULIN GLARGINE (1 UNIT DIAL) 300 UNIT/ML ~~LOC~~ SOPN: 28.0000 [IU] | PEN_INJECTOR | Freq: Every day | SUBCUTANEOUS | Status: DC

## 2023-05-22 MED ORDER — ZOLPIDEM TARTRATE 5 MG PO TABS
5.0000 mg | ORAL_TABLET | Freq: Every evening | ORAL | Status: DC | PRN
  Administered 2023-05-23 – 2023-05-24 (×2): 5 mg via ORAL
  Filled 2023-05-22 (×3): qty 1

## 2023-05-22 MED ORDER — SODIUM CHLORIDE 0.9% FLUSH
3.0000 mL | Freq: Two times a day (BID) | INTRAVENOUS | Status: DC
  Administered 2023-05-22 – 2023-05-27 (×10): 3 mL via INTRAVENOUS

## 2023-05-22 MED ORDER — MORPHINE SULFATE (PF) 4 MG/ML IV SOLN
4.0000 mg | Freq: Once | INTRAVENOUS | Status: AC
  Administered 2023-05-22: 4 mg via INTRAVENOUS
  Filled 2023-05-22: qty 1

## 2023-05-22 NOTE — Progress Notes (Signed)
Pt has arrived to [id number]. Admission notified

## 2023-05-22 NOTE — ED Triage Notes (Signed)
Pt arrives via [facility] from home for central CP radiating to her belly and SOB for over one week. Pt also reports being out of medications for several years. Pt given 500 mL NS bolus by EMS enroute

## 2023-05-22 NOTE — H&P (Addendum)
History and Physical    [name] [name]:[id number] DOB: 1970-08-05 DOA: 05/22/2023  [facility]P: [facility]  Patient coming from: Home  I have personally briefly reviewed patient's old medical records in [facility]  Chief Complaint: Shortness of breath  HPI: [name] [name] is a 52 y.o. female with medical history significant of COPD, combined systolic and diastolic congestive heart failure with EF of 25%, CKD, type 2 diabetes mellitus, anxiety, chronic pain, hyperlipidemia presented to ED with complaint of shortness of breath.  Patient initially presented to ED on 05/14/2023 with similar complaints.  Extensive workup was done including CTA of the chest, she was ruled out of the PE and did not have any acute CHF at that time so she was discharged home.  Per patient, she has continued to get worse with her symptoms so she came back today.  Other than shortness of breath, she is also complaining of left anterior chest pain which has been intermittent for the last more than a week.  She describes this as alternating between sharp and pressure, radiating to the back, aggravated with deep breathing, better with rest.  Denies any fever, chills, sweating, nausea, vomiting, any problem with urination or with bowel movement, any recent travel or sick contact.,  Per patient she ran out of all of her medications in September and has not taken any medications but her pain medication since 3 months.  ED Course: Upon arrival to ED, slightly tachycardic, tachypneic with slightly elevated diastolic blood pressure.  Chest x-ray today shows marked cardiomegaly with pulmonary vascular congestion.  Magnesium slightly low.  BNP around 2000, much higher than her baseline.  Troponin slightly elevated but this is also chronically elevated and at baseline for her.  Patient received aspirin, fentanyl, Lasix, morphine, Zofran and magnesium sulfate in the ED.  Review of Systems: As per HPI otherwise negative.     Past Medical History:  Diagnosis Date   Anxiety disorder    Asthma    Bipolar affective disorder (HCC)    Blood transfusion without reported diagnosis    CHF (congestive heart failure) (HCC)    COPD (chronic obstructive pulmonary disease) (HCC)    Depression    Diabetes mellitus    Migraine     Past Surgical History:  Procedure Laterality Date   KNEE ARTHROSCOPY     x 2   KNEE SURGERY Left    Incision made and knee cleaned out   TUBAL LIGATION       reports that she has been smoking cigarettes. She has a 29 pack-year smoking history. She has never used smokeless tobacco. She reports that she does not currently use alcohol. She reports that she does not use drugs.  Allergies  Allergen Reactions   Tramadol Nausea Only and Rash   Wellbutrin [Bupropion] Other (See Comments)    Behavioral changes (aggression, irritability)   Benadryl [Diphenhydramine] Other (See Comments)    Decreases motor skills and increases anxiety   Lactose Intolerance (Gi) Diarrhea   Nicotine Rash    Patient states she is allergic to the Nicotine patch and they cause her to break out in a rash.  She states she can smoke cigarettes.    Family History  Problem Relation Age of Onset   Bipolar disorder Mother    Hypertension Father    Diabetes Father     Prior to Admission medications   Medication Sig Start Date End Date Taking? Authorizing Provider  albuterol (VENTOLIN HFA) 108 (90 Base)  MCG/ACT inhaler Inhale 2 puffs into the lungs every 6 (six) hours as needed for wheezing or shortness of breath. 09/27/22  Yes Arrien, York Ram, MD  diphenhydramine-acetaminophen (TYLENOL PM) 25-500 MG TABS tablet Take 2 tablets by mouth at bedtime as needed (sleep).   Yes [provider]  ibuprofen (ADVIL) 200 MG tablet Take 200 mg by mouth every 6 (six) hours as needed for headache or mild pain (pain score 1-3).   Yes [provider]  oxyCODONE-acetaminophen (PERCOCET) 10-325 MG tablet  Take 1 tablet by mouth every 6 (six) hours as needed for pain. 09/02/22  Yes Wouk, Wilfred Curtis, MD  sertraline (ZOLOFT) 100 MG tablet Take 100 mg by mouth daily. 03/27/23  Yes [provider]  zolpidem (AMBIEN) 10 MG tablet Take 10 mg by mouth at bedtime as needed for sleep. 03/27/23  Yes [provider]  bumetanide (BUMEX) 1 MG tablet Take 1 tablet (1 mg total) by mouth daily. Patient not taking: Reported on 05/22/2023 02/23/23 03/25/23  Charm Rings, NP  vitamin D3 (CHOLECALCIFEROL) 25 MCG tablet Take 1 tablet (1,000 Units total) by mouth daily. Patient not taking: Reported on 05/22/2023 02/13/23   Hassan Rowan, [place], MD  glucose blood [facility] VERIO) test strip Use three times daily as directed to check blood sugar 02/11/23   Alexander-Savino, [place], MD  insulin glargine, 1 Unit Dial, (TOUJEO SOLOSTAR) 300 UNIT/ML Solostar Pen Inject 28 Units into the skin at bedtime. Patient not taking: Reported on 05/22/2023 02/12/23   Manuela Neptune, MD  Insulin Pen Needle 32G X 4 MM MISC Use As Directed 09/30/22   Arrien, York Ram, MD  Insulin Pen Needle 32G X 4 MM MISC Use 3 (three) times daily. 02/11/23   Alexander-Savino, [place], MD  ipratropium-albuterol (DUONEB) 0.5-2.5 (3) MG/3ML SOLN inhale 3 mLs by nebulization every 6 (six) hours as needed. Patient not taking: Reported on 05/22/2023 10/11/22   Alen Bleacher, NP  Lancet Devices [facility] DELICA PLUS LANCING) MISC Use 3 (three) times daily. 02/11/23   Alexander-Savino, [place], MD  Lurasidone HCl 60 MG TABS Take 1 tablet (60 mg total) by mouth daily with supper. Patient not taking: Reported on 05/22/2023 02/22/23 03/24/23  Charm Rings, NP  midodrine (PROAMATINE) 5 MG tablet Take 1 tablet (5 mg total) by mouth 3 (three) times daily with meals. Patient not taking: Reported on 05/22/2023 02/12/23   Hassan Rowan, [place], MD  mometasone-formoterol [facility]) 200-5 MCG/ACT AERO Inhale 2 puffs into the lungs 2  (two) times daily. Patient not taking: Reported on 05/22/2023 02/11/23   Carmina Miller, DO  OneTouch Delica Lancets 33G MISC Use 3 (three) times daily as directed to check blood sugar 02/11/23   Alexander-Savino, [place], MD  potassium chloride (KLOR-CON M) 10 MEQ tablet Take 1 tablet (10 mEq total) by mouth daily. Patient not taking: Reported on 02/06/2023 10/11/22 07/08/23  Alen Bleacher, NP  rosuvastatin (CRESTOR) 20 MG tablet Take 1 tablet (20 mg total) by mouth daily. Patient not taking: Reported on 05/22/2023 02/11/23 03/13/23  Manuela Neptune, MD  benztropine (COGENTIN) 1 MG tablet Take 1 tablet (1 mg total) by mouth 2 (two) times daily. 12/23/18 06/04/20  Malvin Johns, MD  insulin aspart (NOVOLOG) 100 UNIT/ML injection Inject 0-9 Units into the skin 3 (three) times daily with meals. Patient not taking: Reported on 06/03/2020 12/16/18 06/04/20  Lanae Boast, MD    Physical Exam: Vitals:   05/22/23 1445 05/22/23 1530 05/22/23 1630  BP: (!) 138/93 (!) 134/99 116/78  Pulse: Marland Kitchen)  112 (!) 108 69  Resp: (!) 29 19 17   Temp: 98.3 F (36.8 C)    TempSrc: Oral    SpO2: 99% 99% 93%    Constitutional: NAD, calm, comfortable Vitals:   05/22/23 1445 05/22/23 1530 05/22/23 1630  BP: (!) 138/93 (!) 134/99 116/78  Pulse: (!) 112 (!) 108 69  Resp: (!) 29 19 17   Temp: 98.3 F (36.8 C)    TempSrc: Oral    SpO2: 99% 99% 93%   Eyes: PERRL, lids and conjunctivae normal ENMT: Mucous membranes are moist. Posterior pharynx clear of any exudate or lesions.Normal dentition.  Neck: normal, supple, no masses, no thyromegaly Respiratory: Faint crackles at the bases bilaterally.  No wheezes or rhonchi. Cardiovascular: Regular rate and rhythm, no murmurs / rubs / gallops. No extremity edema. 2+ pedal pulses. No carotid bruits.  Abdomen: no tenderness, no masses palpated. No hepatosplenomegaly. Bowel sounds positive.  Musculoskeletal: no clubbing / cyanosis. No joint deformity upper and lower extremities.  Good ROM, no contractures. Normal muscle tone.  Skin: no rashes, lesions, ulcers. No induration Neurologic: CN 2-12 grossly intact. Sensation intact, DTR normal. Strength 5/5 in all 4.   Labs on Admission: I have personally reviewed following labs and imaging studies  CBC: Recent Labs  Lab 05/22/23 1458  WBC 7.4  HGB 10.5*  HCT 33.4*  MCV 91.5  PLT 169   Basic Metabolic Panel: Recent Labs  Lab 05/22/23 1458  NA 143  K 3.7  CL 112*  CO2 19*  GLUCOSE 396*  BUN 14  CREATININE 0.95  CALCIUM 7.8*  MG 1.6*   GFR: Estimated Creatinine Clearance: 69 mL/min (by C-G formula based on SCr of 0.95 mg/dL). Liver Function Tests: Recent Labs  Lab 05/22/23 1458  AST 26  ALT 32  ALKPHOS 105  BILITOT 1.0  PROT 5.5*  ALBUMIN 2.9*   No results for input(s): "LIPASE", "AMYLASE" in the last 168 hours. No results for input(s): "AMMONIA" in the last 168 hours. Coagulation Profile: No results for input(s): "INR", "PROTIME" in the last 168 hours. Cardiac Enzymes: No results for input(s): "CKTOTAL", "CKMB", "CKMBINDEX", "TROPONINI" in the last 168 hours. BNP (last 3 results) No results for input(s): "PROBNP" in the last 8760 hours. HbA1C: No results for input(s): "HGBA1C" in the last 72 hours. CBG: No results for input(s): "GLUCAP" in the last 168 hours. Lipid Profile: No results for input(s): "CHOL", "HDL", "LDLCALC", "TRIG", "CHOLHDL", "LDLDIRECT" in the last 72 hours. Thyroid Function Tests: No results for input(s): "TSH", "T4TOTAL", "FREET4", "T3FREE", "THYROIDAB" in the last 72 hours. Anemia Panel: No results for input(s): "VITAMINB12", "FOLATE", "FERRITIN", "TIBC", "IRON", "RETICCTPCT" in the last 72 hours. Urine analysis:    Component Value Date/Time   COLORURINE YELLOW 09/06/2022 0108   APPEARANCEUR HAZY (A) 09/06/2022 0108   LABSPEC 1.026 09/06/2022 0108   PHURINE 5.0 09/06/2022 0108   GLUCOSEU >=500 (A) 09/06/2022 0108   HGBUR NEGATIVE 09/06/2022 0108   HGBUR  trace-intact 11/25/2008 1530   BILIRUBINUR NEGATIVE 09/06/2022 0108   KETONESUR NEGATIVE 09/06/2022 0108   PROTEINUR NEGATIVE 09/06/2022 0108   UROBILINOGEN 0.2 05/12/2014 2315   NITRITE NEGATIVE 09/06/2022 0108   LEUKOCYTESUR NEGATIVE 09/06/2022 0108    Radiological Exams on Admission: DG Chest 2 View Result Date: 05/22/2023 CLINICAL DATA:  Chest pain and shortness of breath. EXAM: CHEST - 2 VIEW COMPARISON:  Chest radiographs 05/14/2023, 02/10/2023, 02/06/2023 FINDINGS: Cardiac silhouette is again markedly enlarged. Mediastinal contours are within normal limits. There is again cephalization of the pulmonary  vasculature. No overt pulmonary edema. No pleural effusion pneumothorax. Moderate multilevel degenerative disc changes of the thoracic spine. IMPRESSION: Marked cardiomegaly. Stable pulmonary vascular congestion. No overt pulmonary edema. Electronically Signed   By: Neita Garnet M.D.   On: 05/22/2023 16:15    EKG: Independently reviewed.  Sinus tachycardia, LVH, biatrial enlargement.  Assessment/Plan Active Problems:   Acute on chronic combined systolic and diastolic CHF (congestive heart failure) (HCC)   Hypotension   Prolonged QT interval   CKD stage 3a, GFR 45-59 ml/min (HCC)   DM2 (diabetes mellitus, type 2) (HCC)   COPD (chronic obstructive pulmonary disease) (HCC)   HTN (hypertension)   Hypomagnesemia   Acute on chronic combined systolic and diastolic congestive heart failure: Recent echo in chart from almost 2 months ago shows 20 to 25% ejection fraction and grade 2 diastolic dysfunction.  Has faint crackles but no edema at all.  Has been given Lasix in the ED, will continue Lasix 40 mg IV twice daily with a strict I's and O's, daily weight and low-sodium diet.  Chest pain: Likely musculoskeletal.  Troponin slightly elevated but at her baseline, chronically elevated.  Monitor on telemetry.  Resume pain medications.  COPD: Stable.  No wheezes.  Resume home  inhalers.  Hypomagnesemia: She has been replenished already.  Recheck labs in the morning.  Hypotension: She carries a history of hypertension but her blood pressure upon arrival was 138/93 despite of not taking her midodrine so I will hold midodrine.   CKD stage IIIa: Creatinine today is better than her baseline.  Uncontrolled type 2 diabetes mellitus with hyperglycemia: Hyperglycemic with blood sugar of 396 today, has not used any of her medications in 3 months as she mentioned.  Will resume Lantus 28 units nightly as she is supposed to be taking and will add SSI.   Chronic pain syndrome: Resume home Percocet.  Hyperlipidemia: Resume Crestor.  History of noncompliance: Longstanding history of noncompliance.  Patient does not even know who her PCP is.  Compliance encouraged.  Insomnia: Resume zolpidem as needed.  Bipolar 1 disorder: Looks like she was hospitalized at psychiatry unit from 02/12/2023 through 02/22/2023 and during this hospitalization, her risperidone was stopped and she was started on lurasidone.  Since she has not taking that medication for 3 months and currently does not appear to be in acute mania or depression, I will hold off to that medication.  Psychiatry consult may be warranted if she were to develop psychiatry symptoms.  DVT prophylaxis: enoxaparin (LOVENOX) injection 40 mg Start: 05/22/23 1800 Code Status: Full code Family Communication: None present at bedside.  Plan of care discussed with patient in length and he verbalized understanding and agreed with it. Disposition Plan: To be determined Consults called: None  Hughie Closs MD [facility]  *Please note that this is a verbal dictation therefore any spelling or grammatical errors are due to the "[employer]" system interpretation.  Please page via Amion and do not message via secure chat for urgent patient care matters. Secure chat can be used for non urgent patient care matters. 05/22/2023,  5:58 PM  To contact the attending provider between 7A-7P or the covering provider during after hours 7P-7A, please log into the web site [email or web address]

## 2023-05-22 NOTE — ED Notes (Signed)
ED TO INPATIENT HANDOFF REPORT  ED Nurse Name and Phone #: Osvaldo Shipper RN [phone]  S Name/Age/Gender [name] [name] 52 y.o. female Room/Bed: 039C/039C  Code Status   Code Status: Full Code  Home/SNF/Other Home Patient oriented to: self, place, time, and situation Is this baseline? Yes   Triage Complete: Triage complete  Chief Complaint CHF (congestive heart failure) (HCC) [I50.9]  Triage Note Pt arrives via [facility] from home for central CP radiating to her belly and SOB for over one week. Pt also reports being out of medications for several years. Pt given 500 mL NS bolus by EMS enroute   Allergies Allergies  Allergen Reactions   Tramadol Nausea Only and Rash   Wellbutrin [Bupropion] Other (See Comments)    Behavioral changes (aggression, irritability)   Benadryl [Diphenhydramine] Other (See Comments)    Decreases motor skills and increases anxiety   Lactose Intolerance (Gi) Diarrhea   Nicotine Rash    Patient states she is allergic to the Nicotine patch and they cause her to break out in a rash.  She states she can smoke cigarettes.    Level of Care/Admitting Diagnosis ED Disposition     ED Disposition  Admit   Condition  --   Comment  Hospital Area: [facility] [[id number]]  Level of Care: Telemetry Medical [104]  May place patient in observation at [facility] or [facility] if equivalent level of care is available:: No  Covid Evaluation: Asymptomatic - no recent exposure (last 10 days) testing not required  Diagnosis: CHF (congestive heart failure) [facility]) [[id number]]  Admitting Physician: Hughie Closs [[id number]]  Attending Physician: Hughie Closs [[id number]]          B Medical/Surgery History Past Medical History:  Diagnosis Date   Anxiety disorder    Asthma    Bipolar affective disorder (HCC)    Blood transfusion without reported diagnosis    CHF (congestive heart failure) (HCC)    COPD (chronic obstructive pulmonary disease) (HCC)     Depression    Diabetes mellitus    Migraine    Past Surgical History:  Procedure Laterality Date   KNEE ARTHROSCOPY     x 2   KNEE SURGERY Left    Incision made and knee cleaned out   TUBAL LIGATION       A IV Location/Drains/Wounds Patient Lines/Drains/Airways Status     Active Line/Drains/Airways     Name Placement date Placement time Site Days   Peripheral IV 05/22/23 20 G Left;Posterior Hand 05/22/23  1449  Hand  less than 1   Peripheral IV 05/22/23 20 G 1" Posterior;Right Hand 05/22/23  1644  Hand  less than 1   Pressure Injury 09/17/22 Buttocks Left;Right Stage 1 -  Intact skin with non-blanchable redness of a localized area usually over a bony prominence. On both checks 09/17/22  2000  -- 247            Intake/Output Last 24 hours No intake or output data in the 24 hours ending 05/22/23 1803  Labs/Imaging Results for orders placed or performed during the hospital encounter of 05/22/23 (from the past 48 hours)  Basic metabolic panel     Status: Abnormal   Collection Time: 05/22/23  2:58 PM  Result Value Ref Range   Sodium 143 135 - 145 mmol/L   Potassium 3.7 3.5 - 5.1 mmol/L   Chloride 112 (H) 98 - 111 mmol/L   CO2 19 (L) 22 - 32 mmol/L   Glucose, Bld  396 (H) 70 - 99 mg/dL    Comment: Glucose reference range applies only to samples taken after fasting for at least 8 hours.   BUN 14 6 - 20 mg/dL   Creatinine, Ser 3.50 0.44 - 1.00 mg/dL   Calcium 7.8 (L) 8.9 - 10.3 mg/dL   GFR, Estimated >09 >38 mL/min    Comment: (NOTE) Calculated using the CKD-EPI Creatinine Equation (2021)    Anion gap 12 5 - 15    Comment: Performed at [facility], [address], [place], [place] [address]  CBC     Status: Abnormal   Collection Time: 05/22/23  2:58 PM  Result Value Ref Range   WBC 7.4 4.0 - 10.5 K/uL   RBC 3.65 (L) 3.87 - 5.11 MIL/uL   Hemoglobin 10.5 (L) 12.0 - 15.0 g/dL   HCT 37.1 (L) 69.6 - 78.9 %   MCV 91.5 80.0 - 100.0 fL   MCH 28.8 26.0 - 34.0 pg    MCHC 31.4 30.0 - 36.0 g/dL   RDW 38.1 (H) 01.7 - 51.0 %   Platelets 169 150 - 400 K/uL   nRBC 0.4 (H) 0.0 - 0.2 %    Comment: Performed at [facility], [address], [place], [place] [address]  Troponin I (High Sensitivity)     Status: Abnormal   Collection Time: 05/22/23  2:58 PM  Result Value Ref Range   Troponin I (High Sensitivity) 26 (H) <18 ng/L    Comment: (NOTE) Elevated high sensitivity troponin I (hsTnI) values and significant  changes across serial measurements may suggest ACS but many other  chronic and acute conditions are known to elevate hsTnI results.  Refer to the "Links" section for chest pain algorithms and additional  guidance. Performed at [facility], [address] [place], [place] [address]   Hepatic function panel     Status: Abnormal   Collection Time: 05/22/23  2:58 PM  Result Value Ref Range   Total Protein 5.5 (L) 6.5 - 8.1 g/dL   Albumin 2.9 (L) 3.5 - 5.0 g/dL   AST 26 15 - 41 U/L   ALT 32 0 - 44 U/L   Alkaline Phosphatase 105 38 - 126 U/L   Total Bilirubin 1.0 <1.2 mg/dL   Bilirubin, Direct 0.3 (H) 0.0 - 0.2 mg/dL   Indirect Bilirubin 0.7 0.3 - 0.9 mg/dL    Comment: Performed at [facility], [address] [place], [place] [address]  Brain natriuretic peptide     Status: Abnormal   Collection Time: 05/22/23  2:58 PM  Result Value Ref Range   B Natriuretic Peptide 2,978.4 (H) 0.0 - 100.0 pg/mL    Comment: Performed at [facility], [address], [place], [place] [address]  Magnesium     Status: Abnormal   Collection Time: 05/22/23  2:58 PM  Result Value Ref Range   Magnesium 1.6 (L) 1.7 - 2.4 mg/dL    Comment: Performed at [facility], [address] [place], [place] [address]  Troponin I (High Sensitivity)     Status: Abnormal   Collection Time: 05/22/23  4:48 PM  Result Value Ref Range   Troponin I (High Sensitivity) 29 (H) <18 ng/L    Comment: (NOTE) Elevated high sensitivity troponin I (hsTnI)  values and significant  changes across serial measurements may suggest ACS but many other  chronic and acute conditions are known to elevate hsTnI results.  Refer to  the "Links" section for chest pain algorithms and additional  guidance. Performed at [facility], [address], [place], [place] [address]    DG Chest 2 View Result Date: 05/22/2023 CLINICAL DATA:  Chest pain and shortness of breath. EXAM: CHEST - 2 VIEW COMPARISON:  Chest radiographs 05/14/2023, 02/10/2023, 02/06/2023 FINDINGS: Cardiac silhouette is again markedly enlarged. Mediastinal contours are within normal limits. There is again cephalization of the pulmonary vasculature. No overt pulmonary edema. No pleural effusion pneumothorax. Moderate multilevel degenerative disc changes of the thoracic spine. IMPRESSION: Marked cardiomegaly. Stable pulmonary vascular congestion. No overt pulmonary edema. Electronically Signed   By: Neita Garnet M.D.   On: 05/22/2023 16:15    Pending Labs Unresulted Labs (From admission, onward)     Start     Ordered   05/29/23 0500  Creatinine, serum  (enoxaparin (LOVENOX)    CrCl >/= 30 ml/min)  Weekly,   R     Comments: while on enoxaparin therapy    05/22/23 1756   05/22/23 1757  TSH  Once,   R        05/22/23 1756   05/22/23 1757  Hemoglobin A1c  Once,   R        05/22/23 1756   05/22/23 1756  CBC  (enoxaparin (LOVENOX)    CrCl >/= 30 ml/min)  Once,   R       Comments: Baseline for enoxaparin therapy IF NOT ALREADY DRAWN.  Notify MD if PLT < 100 K.    05/22/23 1756   05/22/23 1756  Creatinine, serum  (enoxaparin (LOVENOX)    CrCl >/= 30 ml/min)  Once,   R       Comments: Baseline for enoxaparin therapy IF NOT ALREADY DRAWN.    05/22/23 1756            Vitals/Pain Today's Vitals   05/22/23 1628 05/22/23 1630 05/22/23 1644 05/22/23 1727  BP:  116/78    Pulse:  69    Resp:  17    Temp:      TempSrc:      SpO2:  93%    PainSc: 7   7  5      Isolation  Precautions No active isolations  Medications Medications  albuterol (VENTOLIN HFA) 108 (90 Base) MCG/ACT inhaler 2 puff (has no administration in time range)  insulin glargine (1 Unit Dial) (TOUJEO) Solostar Pen SOPN 28 Units (has no administration in time range)  midodrine (PROAMATINE) tablet 5 mg (has no administration in time range)  mometasone-formoterol (DULERA) 200-5 MCG/ACT inhaler 2 puff (has no administration in time range)  oxyCODONE-acetaminophen (PERCOCET) 10-325 MG per tablet 1 tablet (has no administration in time range)  rosuvastatin (CRESTOR) tablet 20 mg (has no administration in time range)  sertraline (ZOLOFT) tablet 100 mg (has no administration in time range)  cholecalciferol (VITAMIN D3) 25 MCG (1000 UNIT) tablet 1,000 Units (has no administration in time range)  zolpidem (AMBIEN) tablet 10 mg (has no administration in time range)  furosemide (LASIX) injection 40 mg (has no administration in time range)  enoxaparin (LOVENOX) injection 40 mg (has no administration in time range)  sodium chloride flush (NS) 0.9 % injection 3 mL (has no administration in time range)  acetaminophen (TYLENOL) tablet 650 mg (has no administration in time range)    Or  acetaminophen (TYLENOL) suppository 650 mg (has no administration in time range)  insulin aspart (novoLOG) injection 0-15 Units (has no administration in time range)  insulin aspart (  novoLOG) injection 0-5 Units (has no administration in time range)  aspirin chewable tablet 324 mg (324 mg Oral Given 05/22/23 1456)  fentaNYL (SUBLIMAZE) injection 50 mcg (50 mcg Intravenous Given 05/22/23 1545)  ondansetron (ZOFRAN) injection 4 mg (4 mg Intravenous Given 05/22/23 1544)  magnesium sulfate IVPB 2 g 50 mL (2 g Intravenous New Bag/Given 05/22/23 1628)  furosemide (LASIX) injection 40 mg (40 mg Intravenous Given 05/22/23 1646)  morphine (PF) 4 MG/ML injection 4 mg (4 mg Intravenous Given 05/22/23 1645)    Mobility Walks with  assistance     Focused Assessments Cardiac Assessment Handoff:  Cardiac Rhythm: Sinus tachycardia Lab Results  Component Value Date   TROPONINI 0.04 (HH) 01/04/2018   Lab Results  Component Value Date   DDIMER 3.06 (H) 05/15/2022   Does the Patient currently have chest pain? No    R Recommendations: See Admitting Provider Note  Report given to:   Additional Notes:

## 2023-05-22 NOTE — ED Provider Notes (Signed)
[facility] AT [facility] Provider Note   CSN: [id number] Arrival date & time: 05/22/23  1438     History  Chief Complaint  Patient presents with   Chest Pain   Shortness of Breath    [name] [name] is a 52 y.o. female.  52 year old female with a past medical history of COPD, DM type II, anxiety, depression, schizophrenia, CHF with an EF of 20 to 25% who presents here for progressively worsening shortness of breath that has been ongoing for the last approximately 1-1/2 weeks.  She is also endorsing central chest pain that is sharp in nature and worsened with inspiration.  She denies worsening of her chest pain with exertion.  She was evaluated in this emergency department on 12/4 for the same symptoms.  Her symptoms were attributed to her asthma at that time and she was ultimately discharged.  Patient states that her symptoms have continued to worsen.  She denies any lower extremity edema.  However, when she has had heart failure exacerbations, she does not typically get lower extremity edema.  She is unsure if she is having any abdominal distention.  Patient states that she has been out of all of her medications since September.  On chart review, she previously was on Bumex for diuresis.  She denies any recent illnesses.  Denies fevers, chills.  The history is provided by the patient and medical records.       Home Medications Prior to Admission medications   Medication Sig Start Date End Date Taking? Authorizing Provider  albuterol (VENTOLIN HFA) 108 (90 Base) MCG/ACT inhaler Inhale 2 puffs into the lungs every 6 (six) hours as needed for wheezing or shortness of breath. 09/27/22  Yes Arrien, York Ram, MD  diphenhydramine-acetaminophen (TYLENOL PM) 25-500 MG TABS tablet Take 2 tablets by mouth at bedtime as needed (sleep).   Yes [provider]  ibuprofen (ADVIL) 200 MG tablet Take 200 mg by mouth every 6 (six) hours as needed for  headache or mild pain (pain score 1-3).   Yes [provider]  oxyCODONE-acetaminophen (PERCOCET) 10-325 MG tablet Take 1 tablet by mouth every 6 (six) hours as needed for pain. 09/02/22  Yes Wouk, Wilfred Curtis, MD  sertraline (ZOLOFT) 100 MG tablet Take 100 mg by mouth daily. 03/27/23  Yes [provider]  zolpidem (AMBIEN) 10 MG tablet Take 10 mg by mouth at bedtime as needed for sleep. 03/27/23  Yes [provider]  bumetanide (BUMEX) 1 MG tablet Take 1 tablet (1 mg total) by mouth daily. Patient not taking: Reported on 05/22/2023 02/23/23 03/25/23  Charm Rings, NP  vitamin D3 (CHOLECALCIFEROL) 25 MCG tablet Take 1 tablet (1,000 Units total) by mouth daily. Patient not taking: Reported on 05/22/2023 02/13/23   Hassan Rowan, [place], MD  glucose blood [facility] VERIO) test strip Use three times daily as directed to check blood sugar 02/11/23   Alexander-Savino, [place], MD  insulin glargine, 1 Unit Dial, (TOUJEO SOLOSTAR) 300 UNIT/ML Solostar Pen Inject 28 Units into the skin at bedtime. Patient not taking: Reported on 05/22/2023 02/12/23   Manuela Neptune, MD  Insulin Pen Needle 32G X 4 MM MISC Use As Directed 09/30/22   Arrien, York Ram, MD  Insulin Pen Needle 32G X 4 MM MISC Use 3 (three) times daily. 02/11/23   Alexander-Savino, [place], MD  ipratropium-albuterol (DUONEB) 0.5-2.5 (3) MG/3ML SOLN inhale 3 mLs by nebulization every 6 (six) hours as needed. Patient not taking: Reported on 05/22/2023 10/11/22  Alen Bleacher, NP  Lancet Devices [facility] DELICA PLUS LANCING) MISC Use 3 (three) times daily. 02/11/23   Alexander-Savino, [place], MD  Lurasidone HCl 60 MG TABS Take 1 tablet (60 mg total) by mouth daily with supper. Patient not taking: Reported on 05/22/2023 02/22/23 03/24/23  Charm Rings, NP  midodrine (PROAMATINE) 5 MG tablet Take 1 tablet (5 mg total) by mouth 3 (three) times daily with meals. Patient not taking: Reported on 05/22/2023  02/12/23   Hassan Rowan, [place], MD  mometasone-formoterol [facility]) 200-5 MCG/ACT AERO Inhale 2 puffs into the lungs 2 (two) times daily. Patient not taking: Reported on 05/22/2023 02/11/23   Carmina Miller, DO  OneTouch Delica Lancets 33G MISC Use 3 (three) times daily as directed to check blood sugar 02/11/23   Alexander-Savino, [place], MD  potassium chloride (KLOR-CON M) 10 MEQ tablet Take 1 tablet (10 mEq total) by mouth daily. Patient not taking: Reported on 02/06/2023 10/11/22 07/08/23  Alen Bleacher, NP  rosuvastatin (CRESTOR) 20 MG tablet Take 1 tablet (20 mg total) by mouth daily. Patient not taking: Reported on 05/22/2023 02/11/23 03/13/23  Manuela Neptune, MD  benztropine (COGENTIN) 1 MG tablet Take 1 tablet (1 mg total) by mouth 2 (two) times daily. 12/23/18 06/04/20  Malvin Johns, MD  insulin aspart (NOVOLOG) 100 UNIT/ML injection Inject 0-9 Units into the skin 3 (three) times daily with meals. Patient not taking: Reported on 06/03/2020 12/16/18 06/04/20  Lanae Boast, MD      Allergies    Tramadol, Wellbutrin [bupropion], Benadryl [diphenhydramine], Lactose intolerance (gi), and Nicotine    Review of Systems   As noted in HPI  Physical Exam Updated Vital Signs BP 116/78   Pulse 69   Temp 98.3 F (36.8 C) (Oral)   Resp 17   LMP  (LMP Unknown) Comment: Pt is poor historian  SpO2 93%  Physical Exam Vitals reviewed.  Constitutional:      General: She is not in acute distress.    Appearance: Normal appearance. She is not ill-appearing, toxic-appearing or diaphoretic.  HENT:     Head: Normocephalic.     Mouth/Throat:     Mouth: Mucous membranes are moist.  Eyes:     Conjunctiva/sclera: Conjunctivae normal.  Cardiovascular:     Rate and Rhythm: Normal rate and regular rhythm.     Pulses: Normal pulses.          Radial pulses are 2+ on the right side and 2+ on the left side.       Dorsalis pedis pulses are 2+ on the right side and 2+ on the left side.     Heart  sounds: Normal heart sounds. No murmur heard.    No friction rub. No gallop.  Pulmonary:     Effort: Pulmonary effort is normal. No respiratory distress.     Breath sounds: Wheezing (Inspiratory and expiratory wheezes in all lung fields) present. No rhonchi or rales.  Abdominal:     General: There is no distension.     Palpations: Abdomen is soft.     Tenderness: There is no abdominal tenderness. There is no guarding or rebound.  Musculoskeletal:     Right lower leg: No edema.     Left lower leg: No edema.  Skin:    General: Skin is warm and dry.     Coloration: Skin is not jaundiced or pale.  Neurological:     Mental Status: She is alert.     ED Results / Procedures / Treatments  Labs (all labs ordered are listed, but only abnormal results are displayed) Labs Reviewed  BASIC METABOLIC PANEL - Abnormal; Notable for the following components:      Result Value   Chloride 112 (*)    CO2 19 (*)    Glucose, Bld 396 (*)    Calcium 7.8 (*)    All other components within normal limits  CBC - Abnormal; Notable for the following components:   RBC 3.65 (*)    Hemoglobin 10.5 (*)    HCT 33.4 (*)    RDW 15.7 (*)    nRBC 0.4 (*)    All other components within normal limits  HEPATIC FUNCTION PANEL - Abnormal; Notable for the following components:   Total Protein 5.5 (*)    Albumin 2.9 (*)    Bilirubin, Direct 0.3 (*)    All other components within normal limits  BRAIN NATRIURETIC PEPTIDE - Abnormal; Notable for the following components:   B Natriuretic Peptide 2,978.4 (*)    All other components within normal limits  MAGNESIUM - Abnormal; Notable for the following components:   Magnesium 1.6 (*)    All other components within normal limits  TROPONIN I (HIGH SENSITIVITY) - Abnormal; Notable for the following components:   Troponin I (High Sensitivity) 26 (*)    All other components within normal limits  TROPONIN I (HIGH SENSITIVITY)    EKG EKG  Interpretation Date/Time:  Thursday May 22 2023 14:51:49 EST Ventricular Rate:  106 PR Interval:  163 QRS Duration:  112 QT Interval:  364 QTC Calculation: 484 R Axis:   -28  Text Interpretation: Sinus tachycardia Biatrial enlargement Left ventricular hypertrophy rate faster since previous Confirmed by Richardean Canal [phone]) on 05/22/2023 3:42:12 PM  Radiology DG Chest 2 View Result Date: 05/22/2023 CLINICAL DATA:  Chest pain and shortness of breath. EXAM: CHEST - 2 VIEW COMPARISON:  Chest radiographs 05/14/2023, 02/10/2023, 02/06/2023 FINDINGS: Cardiac silhouette is again markedly enlarged. Mediastinal contours are within normal limits. There is again cephalization of the pulmonary vasculature. No overt pulmonary edema. No pleural effusion pneumothorax. Moderate multilevel degenerative disc changes of the thoracic spine. IMPRESSION: Marked cardiomegaly. Stable pulmonary vascular congestion. No overt pulmonary edema. Electronically Signed   By: Neita Garnet M.D.   On: 05/22/2023 16:15    Procedures Procedures    Medications Ordered in ED Medications  magnesium sulfate IVPB 2 g 50 mL (2 g Intravenous New Bag/Given 05/22/23 1628)  aspirin chewable tablet 324 mg (324 mg Oral Given 05/22/23 1456)  fentaNYL (SUBLIMAZE) injection 50 mcg (50 mcg Intravenous Given 05/22/23 1545)  ondansetron (ZOFRAN) injection 4 mg (4 mg Intravenous Given 05/22/23 1544)  furosemide (LASIX) injection 40 mg (40 mg Intravenous Given 05/22/23 1646)  morphine (PF) 4 MG/ML injection 4 mg (4 mg Intravenous Given 05/22/23 1645)    ED Course/ Medical Decision Making/ A&P Clinical Course as of 05/22/23 1711  Thu May 22, 2023  1502 ED EKG Sinus rhythm.  Rate of 106.  Prolonged QRS of 112 ms.  Large voltage QRS complexes in anterior and lateral leads consistent with LVH.  No ST segment changes.  When compared to prior ECG of 12/4, no significant changes noted. [JR]    Clinical Course User Index [JR] Rolla Flatten, MD                                 Medical Decision Making Amount and/or Complexity of Data  Reviewed Labs: ordered. Radiology: ordered and independent interpretation performed. ECG/medicine tests: ordered and independent interpretation performed. Decision-making details documented in ED Course.  Risk Prescription drug management. Parenteral controlled substances. Decision regarding hospitalization.   52 year old female presents here for concerns of shortness of breath and chest pain that has been progressively worsening since her last visit to this emergency department on 12/4.  Vitals notable for tachycardia.  On exam, patient appears tachypneic.  She does have some wheezing on exam.  However, she states she has been taking her albuterol inhaler at home, without relief of her symptoms.  Remainder of exam unremarkable.  Initial differential diagnosis includes ACS, PE, pneumothorax, CHF exacerbation, electrolyte abnormality.  I independently interpreted the patient's chest x-ray.  She has some cardiomegaly.  No significant pulmonary edema.  No pneumothorax.  No mediastinal widening noted.  I independently interpreted the patient's ECG, which is consistent for priors.  No acute ischemic findings noted.  Independently interpreted the patient's laboratory workup.  CBC notable for hemoglobin of 10.5, which is slightly decreased from patient's baseline of 12.  However, no occasion for transfusion today.  Hepatic function panel without clinically significant findings.  BNP notably elevated at 2978, which is increased from BNP of 700s at her visit on 12/4.  This would be consistent with an acute heart failure exacerbation.  Patient's initial troponin is elevated at 26.  Although, this appears to be at patient's baseline of 20-30.  Repeat troponin is pending.  Magnesium is low at 1.6, which is being repleted with 2 g magnesium.  BMP without any changes from patient's baseline.  Workup is  most consistent with acute CHF exacerbation.  Patient given diuresis with Lasix 40 mg IV.  Her pain was treated with fentanyl followed by morphine.  I considered ACS.  However, her troponin elevation would be more consistent with a type II NSTEMI in the setting of demand from CHF exacerbation rather than stenosis or occlusion.  ECG is nonischemic today.  Patient had imaging to evaluate for PE on her most recent visit on 12/4.  Symptoms have not changed since then.  Do not feel repeat imaging is indicated today.  No evidence of pneumothorax on chest x-ray today.  Feel the patient requires admission for further treatment of CHF exacerbation.  I spoke with Dr. Jacqulyn Bath, who has accepted the patient for admission.  Patient's presentation is most consistent with severe exacerbation of chronic illness.         Final Clinical Impression(s) / ED Diagnoses Final diagnoses:  Acute on chronic congestive heart failure, unspecified heart failure type (HCC)  Hypomagnesemia    Rx / DC Orders ED Discharge Orders     None         Rolla Flatten, MD 05/22/23 1711    Charlynne Pander, MD 05/27/23 1501

## 2023-05-23 ENCOUNTER — Encounter (HOSPITAL_COMMUNITY): Payer: Self-pay | Admitting: Family Medicine

## 2023-05-23 DIAGNOSIS — E119 Type 2 diabetes mellitus without complications: Secondary | ICD-10-CM | POA: Diagnosis not present

## 2023-05-23 DIAGNOSIS — E1165 Type 2 diabetes mellitus with hyperglycemia: Secondary | ICD-10-CM | POA: Diagnosis present

## 2023-05-23 DIAGNOSIS — E739 Lactose intolerance, unspecified: Secondary | ICD-10-CM | POA: Diagnosis present

## 2023-05-23 DIAGNOSIS — R0609 Other forms of dyspnea: Secondary | ICD-10-CM | POA: Diagnosis not present

## 2023-05-23 DIAGNOSIS — Z794 Long term (current) use of insulin: Secondary | ICD-10-CM

## 2023-05-23 DIAGNOSIS — N179 Acute kidney failure, unspecified: Secondary | ICD-10-CM | POA: Diagnosis present

## 2023-05-23 DIAGNOSIS — N1831 Chronic kidney disease, stage 3a: Secondary | ICD-10-CM

## 2023-05-23 DIAGNOSIS — F17210 Nicotine dependence, cigarettes, uncomplicated: Secondary | ICD-10-CM | POA: Diagnosis present

## 2023-05-23 DIAGNOSIS — Z8249 Family history of ischemic heart disease and other diseases of the circulatory system: Secondary | ICD-10-CM | POA: Diagnosis not present

## 2023-05-23 DIAGNOSIS — E785 Hyperlipidemia, unspecified: Secondary | ICD-10-CM | POA: Diagnosis present

## 2023-05-23 DIAGNOSIS — Z888 Allergy status to other drugs, medicaments and biological substances status: Secondary | ICD-10-CM | POA: Diagnosis not present

## 2023-05-23 DIAGNOSIS — Z885 Allergy status to narcotic agent status: Secondary | ICD-10-CM | POA: Diagnosis not present

## 2023-05-23 DIAGNOSIS — E876 Hypokalemia: Secondary | ICD-10-CM | POA: Diagnosis present

## 2023-05-23 DIAGNOSIS — I959 Hypotension, unspecified: Secondary | ICD-10-CM | POA: Diagnosis not present

## 2023-05-23 DIAGNOSIS — F319 Bipolar disorder, unspecified: Secondary | ICD-10-CM | POA: Diagnosis present

## 2023-05-23 DIAGNOSIS — F209 Schizophrenia, unspecified: Secondary | ICD-10-CM | POA: Diagnosis present

## 2023-05-23 DIAGNOSIS — I509 Heart failure, unspecified: Secondary | ICD-10-CM | POA: Insufficient documentation

## 2023-05-23 DIAGNOSIS — I5023 Acute on chronic systolic (congestive) heart failure: Secondary | ICD-10-CM

## 2023-05-23 DIAGNOSIS — J42 Unspecified chronic bronchitis: Secondary | ICD-10-CM

## 2023-05-23 DIAGNOSIS — E861 Hypovolemia: Secondary | ICD-10-CM | POA: Diagnosis not present

## 2023-05-23 DIAGNOSIS — I340 Nonrheumatic mitral (valve) insufficiency: Secondary | ICD-10-CM | POA: Diagnosis present

## 2023-05-23 DIAGNOSIS — G894 Chronic pain syndrome: Secondary | ICD-10-CM | POA: Diagnosis present

## 2023-05-23 DIAGNOSIS — Z818 Family history of other mental and behavioral disorders: Secondary | ICD-10-CM | POA: Diagnosis not present

## 2023-05-23 DIAGNOSIS — G4700 Insomnia, unspecified: Secondary | ICD-10-CM | POA: Diagnosis present

## 2023-05-23 DIAGNOSIS — E1122 Type 2 diabetes mellitus with diabetic chronic kidney disease: Secondary | ICD-10-CM | POA: Diagnosis present

## 2023-05-23 DIAGNOSIS — J4489 Other specified chronic obstructive pulmonary disease: Secondary | ICD-10-CM | POA: Diagnosis present

## 2023-05-23 DIAGNOSIS — I130 Hypertensive heart and chronic kidney disease with heart failure and stage 1 through stage 4 chronic kidney disease, or unspecified chronic kidney disease: Secondary | ICD-10-CM | POA: Diagnosis present

## 2023-05-23 DIAGNOSIS — E8721 Acute metabolic acidosis: Secondary | ICD-10-CM | POA: Diagnosis present

## 2023-05-23 LAB — GLUCOSE, CAPILLARY
Glucose-Capillary: 189 mg/dL — ABNORMAL HIGH (ref 70–99)
Glucose-Capillary: 215 mg/dL — ABNORMAL HIGH (ref 70–99)
Glucose-Capillary: 198 mg/dL — ABNORMAL HIGH (ref 70–99)
Glucose-Capillary: 184 mg/dL — ABNORMAL HIGH (ref 70–99)

## 2023-05-23 LAB — BASIC METABOLIC PANEL
Anion gap: 9 (ref 5–15)
BUN: 23 mg/dL — ABNORMAL HIGH (ref 6–20)
CO2: 27 mmol/L (ref 22–32)
Calcium: 8 mg/dL — ABNORMAL LOW (ref 8.9–10.3)
Chloride: 104 mmol/L (ref 98–111)
Creatinine, Ser: 1.34 mg/dL — ABNORMAL HIGH (ref 0.44–1.00)
GFR, Estimated: 47 mL/min — ABNORMAL LOW (ref >60)
Glucose, Bld: 155 mg/dL — ABNORMAL HIGH (ref 70–99)
Potassium: 3.3 mmol/L — ABNORMAL LOW (ref 3.5–5.1)
Sodium: 140 mmol/L (ref 135–145)

## 2023-05-23 LAB — MAGNESIUM: Magnesium: 2.1 mg/dL (ref 1.7–2.4)

## 2023-05-23 LAB — LACTIC ACID, PLASMA
Lactic Acid, Venous: 1.2 mmol/L (ref 0.5–1.9)
Lactic Acid, Venous: 2.5 mmol/L (ref 0.5–1.9)

## 2023-05-23 MED ORDER — HYDROMORPHONE HCL 1 MG/ML IJ SOLN
0.5000 mg | INTRAMUSCULAR | Status: DC | PRN
  Administered 2023-05-23 – 2023-05-25 (×8): 0.5 mg via INTRAVENOUS
  Filled 2023-05-23 (×8): qty 0.5

## 2023-05-23 MED ORDER — ORAL CARE MOUTH RINSE: 15.0000 mL | OROMUCOSAL | Status: DC | PRN

## 2023-05-23 MED ORDER — MIDODRINE HCL 5 MG PO TABS
5.0000 mg | ORAL_TABLET | Freq: Three times a day (TID) | ORAL | Status: DC
  Administered 2023-05-23 – 2023-05-26 (×10): 5 mg via ORAL
  Filled 2023-05-23 (×10): qty 1

## 2023-05-23 MED ORDER — FUROSEMIDE 10 MG/ML IJ SOLN
80.0000 mg | Freq: Two times a day (BID) | INTRAMUSCULAR | Status: DC
  Administered 2023-05-23 – 2023-05-26 (×6): 80 mg via INTRAVENOUS
  Filled 2023-05-23 (×6): qty 8

## 2023-05-23 MED ORDER — POTASSIUM CHLORIDE CRYS ER 20 MEQ PO TBCR
40.0000 meq | EXTENDED_RELEASE_TABLET | Freq: Once | ORAL | Status: AC
  Administered 2023-05-23: 40 meq via ORAL
  Filled 2023-05-23: qty 2

## 2023-05-23 MED ORDER — INSULIN GLARGINE-YFGN 100 UNIT/ML ~~LOC~~ SOLN
10.0000 [IU] | Freq: Every day | SUBCUTANEOUS | Status: DC
  Administered 2023-05-23 – 2023-05-26 (×4): 10 [IU] via SUBCUTANEOUS
  Filled 2023-05-23 (×5): qty 0.1

## 2023-05-23 NOTE — Plan of Care (Signed)
Care plan reviewed.

## 2023-05-23 NOTE — TOC Initial Note (Addendum)
Transition of Care [facility]) - Initial/Assessment Note    Patient Details  Name: [name] [name] MRN: [id number] Date of Birth: Mar 30, 1971  Transition of Care [facility]) CM/SW Contact:    Leone Haven, RN Phone Number: 05/23/2023, 4:41 PM  Clinical Narrative:                 From home with a friend, has PCP  , Dr. Laural Benes at [facility] and insurance on file, states has no HH services in place at this time or DME at home.  States her son  will transport her home at [employer] and son and her friend is support system, states gets medications from [facility], off [address] and [address].   Pta self ambulatory. NCM asked why did not she not have her meds for 3 months she states she just did not get to the doctor to get them filled. She has medications coverage.    Expected Discharge Plan: Home/Self Care Barriers to Discharge: Continued Medical Work up   Patient Goals and CMS Choice Patient states their goals for this hospitalization and ongoing recovery are:: return home with friend   Choice offered to / list presented to : NA      Expected Discharge Plan and Services In-house Referral: NA Discharge Planning Services: CM Consult Post Acute Care Choice: NA Living arrangements for the past 2 months: Single Family Home                 DME Arranged: N/A DME Agency: NA       HH Arranged: NA          Prior Living Arrangements/Services Living arrangements for the past 2 months: Single Family Home Lives with:: Friends Patient language and need for interpreter reviewed:: Yes Do you feel safe going back to the place where you live?: Yes      Need for Family Participation in Patient Care: Yes (Comment) Care giver support system in place?: Yes (comment)   Criminal Activity/Legal Involvement Pertinent to Current Situation/Hospitalization: No - Comment as needed  Activities of Daily Living   ADL Screening (condition at time of admission) Independently performs ADLs?: Yes (appropriate  for developmental age) Is the patient deaf or have difficulty hearing?: No Does the patient have difficulty seeing, even when wearing glasses/contacts?: No Does the patient have difficulty concentrating, remembering, or making decisions?: No  Permission Sought/Granted Permission sought to share information with : Case Manager Permission granted to share information with : Yes, Verbal Permission Granted              Emotional Assessment Appearance:: Appears stated age Attitude/Demeanor/Rapport: Engaged Affect (typically observed): Appropriate Orientation: : Oriented to Self, Oriented to Place, Oriented to  Time, Oriented to Situation      Admission diagnosis:  Hypomagnesemia [E83.42] CHF (congestive heart failure) (HCC) [I50.9] Acute on chronic congestive heart failure, unspecified heart failure type (HCC) [I50.9] Heart failure (HCC) [I50.9] Patient Active Problem List   Diagnosis Date Noted   Heart failure (HCC) 05/23/2023   CHF (congestive heart failure) (HCC) 05/22/2023   Hypomagnesemia 05/22/2023   Hypotension 02/14/2023   Prolonged QT interval 09/17/2022   Severe mitral regurgitation 09/17/2022   CKD stage 3a, GFR 45-59 ml/min (HCC) 09/17/2022   Hyperthyroidism 08/29/2022   COPD (chronic obstructive pulmonary disease) (HCC) 08/04/2022   Chronic systolic CHF (congestive heart failure) (HCC) 08/03/2022   Acute on chronic systolic CHF (congestive heart failure) (HCC) 05/15/2022   Overdose, intentional self-harm, initial encounter (HCC) 12/13/2018  DM2 (diabetes mellitus, type 2) (HCC) 01/03/2018   HTN (hypertension) 08/28/2015   PCP:  [facility]:   [place] [facility] [address] [place] [address] Phone: [phone] Fax: [phone]     Social Drivers of Health (SDOH) Social History: SDOH Screenings   Food Insecurity: Food Insecurity Present (05/22/2023)  Housing: Low Risk  (05/22/2023)   Transportation Needs: No Transportation Needs (05/22/2023)  Utilities: Not At Risk (05/22/2023)  Alcohol Screen: Low Risk  (02/12/2023)  Depression (PHQ2-9): High Risk (09/30/2022)  Financial Resource Strain: High Risk (09/30/2022)  Social Connections: Unknown (10/19/2021)   Received from [facility]  Stress: No Stress Concern Present (01/07/2022)   Received from [facility]  Tobacco Use: High Risk (05/23/2023)   SDOH Interventions: Food Insecurity Interventions: Inpatient TOC, Patient Declined (Pt declined resources)   Readmission Risk Interventions    02/12/2023    5:00 PM 09/02/2022    2:41 PM 08/28/2022    3:52 PM  Readmission Risk Prevention Plan  Transportation Screening Complete Complete Complete  Medication Review Oceanographer)  Complete Complete  PCP or Specialist appointment within 3-5 days of discharge  Complete Complete  HRI or Home Care Consult  Complete Complete  SW Recovery Care/Counseling Consult  --   Palliative Care Screening  Not Applicable Not Applicable  Skilled Nursing Facility  Not Applicable Not Applicable

## 2023-05-23 NOTE — Progress Notes (Signed)
Mobility Specialist Progress Note:    05/23/23 1103  Mobility  Activity Transferred to/from [facility]  Level of Assistance Contact guard assist, steadying assist  Assistive Device Other (Comment) (bed rails)  Distance Ambulated (ft) 5 ft  Activity Response Tolerated well  Mobility Referral Yes  Mobility visit 1 Mobility  Mobility Specialist Start Time (ACUTE ONLY) 0900  Mobility Specialist Stop Time (ACUTE ONLY) 0912  Mobility Specialist Time Calculation (min) (ACUTE ONLY) 12 min   Pt received in [facility] w/ bed alarm going off, void complete. Pt was able to get back to bed w/ MinG and use of bed rails for stability. Left seated EOB w/ call bell and personal belongings in reach. All needs met.  Thompson Grayer Mobility Specialist  Please contact vis Secure Chat or  Rehab Office [phone]

## 2023-05-23 NOTE — Assessment & Plan Note (Addendum)
Uncontrolled hyperglycemia.   Patient was placed on insulin sliding scale for glucose cover and monitoring.  Continue with basal insulin to 10 units  Fasting glucose this am 170  mg/dl.

## 2023-05-23 NOTE — Assessment & Plan Note (Signed)
Will add midodrine to prevent further hypotension.

## 2023-05-23 NOTE — Assessment & Plan Note (Signed)
No signs of acute exacerbation.  Continue bronchodilator therapy. LABA/ICS.  Continue with incentive spirometer and flutter valve.

## 2023-05-23 NOTE — TOC CM/SW Note (Signed)
Transition of Care [facility]) - Inpatient Brief Assessment   Patient Details  Name: [name] [name] MRN: [id number] Date of Birth: Aug 26, 1970  Transition of Care [facility]) CM/SW Contact:    Michaela Corner, LCSWA Phone Number: 05/23/2023, 12:16 PM   Clinical Narrative:  CSW met pt at bedside and reviewed SDOH needs for food resources. Pt declined resources at this time.     Transition of Care Asessment: Insurance and Status: Insurance coverage has been reviewed Patient has primary care physician: Yes Home environment has been reviewed: Lives in [facility] [employer] [facility]   Prior/Current Home Services: No current home services Social Drivers of Health Review: SDOH reviewed interventions complete Readmission risk has been reviewed: Yes Transition of care needs: no transition of care needs at this time

## 2023-05-23 NOTE — Hospital Course (Signed)
[name] [name] was admitted to the hospital with the working diagnosis of heart failure decompensation.   52 yo female with the past medical history of COPD, heart failure, CKD, T2DM, and hyperlipidemia who presented with dyspnea. Reported worsening dyspnea over the last 10 days. Positive intermittent chest pain with worse with deep breathing and better with rest. On 12/04 she had a ED evaluation where pulmonary embolism was ruled out, and was discharged home. She has been not adherent to her cardiovascular medications, not taking any since 09/24. On her initial physical examination her blood pressure was 138/93, HR 112, RR 29 and 02 saturation 93%, lungs with bilateral rales, with no wheezing, heart with S1 and S2 present and rhythmic, abdomen with no distention and positive lower extremity edema.   Na 143, K 3,7 CL 112, bicarbonate 19, glucose 396, bun 14 cr 0.95 Mg 1.6  BNP 2,978 High sensitive troponin 26 and 29  Wbc 7.4 hgb 10.5 plt 169   Chest radiograph with cardiomegaly, with bilateral hilar vascular congestion and small left pleural effusion.   12/14 patient with improvement in volume status, lactic acid is trending down.  12/15 responding to diuresis, not yet back to baseline.  12/16 improved volume status, plan to transition to oral loop diuretic tomorrow.

## 2023-05-23 NOTE — Assessment & Plan Note (Signed)
AKI, hypokalemia. Hypomagnesemia.   Renal function with serum cr at 1.39 with K at 3,3 and serum bicarbonate at 33.  Na 138 and Mg 1.7   Plan to continue diuresis with furosemide Electrolyte correction with Kcl 40 meq x2 doses and 4 g mag sulfate. Follow up renal function and electrolytes in am.

## 2023-05-23 NOTE — Assessment & Plan Note (Addendum)
Echocardiogram with reduced LV systolic function EF 20 to 25%, global hypokinesis, mild LVH, RV systolic function preserved, LA with severe dilatation, moderate mitral valve regurgitation, trivial pericardial effusion.  Documented urine output is 400 cc Systolic blood pressure 101 to 107 mmHg.   Plan to continue diuresis with furosemide to 80 mg IV bid.  Add midodrine for blood pressure support and allow better diuresis.  Limited pharmacologic therapy due to hypotension.  Will check a limited echocardiogram on this admission.  Check lactic acid.

## 2023-05-23 NOTE — Progress Notes (Addendum)
Progress Note   Patient: [name] [name] [id number]:[id number] DOB: Jun 25, 1970 DOA: 05/22/2023     0 DOS: the patient was seen and examined on 05/23/2023   Brief hospital course: [name] [name] was admitted to the hospital with the working diagnosis of heart failure decompensation.   52 yo female with the past medical history of COPD, heart failure, CKD, T2DM, and hyperlipidemia who presented with dyspnea. Reported worsening dyspnea over the last 10 days. Positive intermittent chest pain with worse with deep breathing and better with rest. On 12/04 she had a ED evaluation where pulmonary embolism was ruled out, and was discharged home. She has been not adherent to her cardiovascular medications, not taking any since 09/24. On her initial physical examination her blood pressure was 138/93, HR 112, RR 29 and 02 saturation 93%, lungs with bilateral rales, with no wheezing, heart with S1 and S2 present and rhythmic, abdomen with no distention and positive lower extremity edema.   Na 143, K 3,7 CL 112, bicarbonate 19, glucose 396, bun 14 cr 0.95 Mg 1.6  BNP 2,978 High sensitive troponin 26 and 29  Wbc 7.4 hgb 10.5 plt 169   Chest radiograph with cardiomegaly, with bilateral hilar vascular congestion and small left pleural effusion.   Assessment and Plan: Acute on chronic systolic CHF (congestive heart failure) (HCC) Echocardiogram with reduced LV systolic function EF 20 to 25%, global hypokinesis, mild LVH, RV systolic function preserved, LA with severe dilatation, moderate mitral valve regurgitation, trivial pericardial effusion.  Documented urine output is 400 cc Systolic blood pressure 101 to 107 mmHg.   Plan to continue diuresis with furosemide to 80 mg IV bid.  Add midodrine for blood pressure support and allow better diuresis.  Limited pharmacologic therapy due to hypotension.  Will check a limited echocardiogram on this admission.  Check lactic acid.   Hypotension Will add midodrine to  prevent further hypotension.  CKD stage 3a, GFR 45-59 ml/min (HCC) AKI, hypokalemia. Hypomagnesemia.   Renal function with serum cr at 1,34 with K at 3,3 and serum bicarbonate at 27  Na 140  Mg 2.1   Plan to continue diuresis with furosemide Add 40 meq kcl today.  Follow up renal function and electrolytes in am.   DM2 (diabetes mellitus, type 2) (HCC) Continue insulin sliding scale for glucose cover and monitoring.  Reduce dose of basal insulin to 10 units to avoid hypoglycemia.  Glucose this am 155  Capillary glucose 348, 189, 215/   COPD (chronic obstructive pulmonary disease) (HCC) No signs of acute exacerbation.  Continue bronchodilator therapy.    Subjective: Patient continue to have dyspnea, not feeling well today, intermittent chest pain   Physical Exam: Vitals:   05/23/23 0438 05/23/23 0806 05/23/23 0815 05/23/23 1120  BP:   93/65 101/72  Pulse:  90 75 85  Resp:  18 18 18   Temp:   97.7 F (36.5 C) 97.7 F (36.5 C)  TempSrc:   Oral Oral  SpO2:   94% 91%  Weight: 74.7 kg     Height:       Neurology awake and alert ENT with mild pallor Cardiovascular with S1 and S2 present and regular with no gallops, rubs or murmurs Moderate JVD No lower extremity edema Respiratory with rales at bases with mild expiratory wheezing, no rhonchi Abdomen with no distention  Data Reviewed:    Family Communication: no family at the bedside   Disposition: Status is: Inpatient Remains inpatient appropriate because: heart failure   Planned Discharge Destination:  Home     Author: Coralie Keens, MD 05/23/2023 3:56 PM  For on call review [email or web address].

## 2023-05-23 NOTE — Progress Notes (Signed)
Heart Failure Navigator Progress Note  Assessed for Heart & Vascular TOC clinic readiness.  Patient does not meet criteria due to Advanced Heart Failure Team patient of Dr. Virgina Jock.   Navigator will sign off at this time.   Rhae Hammock, BSN, Scientist, clinical (histocompatibility and immunogenetics) Only

## 2023-05-23 NOTE — Progress Notes (Signed)
CRITICAL VALUE STICKER  CRITICAL VALUE: Lactic Acid =2.5  RECEIVER (on-site recipient of call):Demarus Latterell RN  DATE & TIME NOTIFIED: 05/23/2023 1931  MESSENGER (representative from lab):Hilbert Bible  MD NOTIFIED: Dr. Antionette Char  TIME OF NOTIFICATION: 1934  RESPONSE:  New order placed in [facility] to be carried out.

## 2023-05-24 DIAGNOSIS — E119 Type 2 diabetes mellitus without complications: Secondary | ICD-10-CM | POA: Diagnosis not present

## 2023-05-24 DIAGNOSIS — I5023 Acute on chronic systolic (congestive) heart failure: Secondary | ICD-10-CM | POA: Diagnosis not present

## 2023-05-24 DIAGNOSIS — N1831 Chronic kidney disease, stage 3a: Secondary | ICD-10-CM | POA: Diagnosis not present

## 2023-05-24 DIAGNOSIS — E861 Hypovolemia: Secondary | ICD-10-CM | POA: Diagnosis not present

## 2023-05-24 LAB — BASIC METABOLIC PANEL
Anion gap: 9 (ref 5–15)
BUN: 29 mg/dL — ABNORMAL HIGH (ref 6–20)
CO2: 29 mmol/L (ref 22–32)
Calcium: 7.9 mg/dL — ABNORMAL LOW (ref 8.9–10.3)
Chloride: 98 mmol/L (ref 98–111)
Creatinine, Ser: 1.43 mg/dL — ABNORMAL HIGH (ref 0.44–1.00)
GFR, Estimated: 44 mL/min — ABNORMAL LOW (ref >60)
Glucose, Bld: 210 mg/dL — ABNORMAL HIGH (ref 70–99)
Potassium: 4 mmol/L (ref 3.5–5.1)
Sodium: 136 mmol/L (ref 135–145)

## 2023-05-24 LAB — GLUCOSE, CAPILLARY
Glucose-Capillary: 271 mg/dL — ABNORMAL HIGH (ref 70–99)
Glucose-Capillary: 182 mg/dL — ABNORMAL HIGH (ref 70–99)
Glucose-Capillary: 124 mg/dL — ABNORMAL HIGH (ref 70–99)
Glucose-Capillary: 223 mg/dL — ABNORMAL HIGH (ref 70–99)

## 2023-05-24 LAB — MAGNESIUM: Magnesium: 1.7 mg/dL (ref 1.7–2.4)

## 2023-05-24 LAB — LACTIC ACID, PLASMA: Lactic Acid, Venous: 1.2 mmol/L (ref 0.5–1.9)

## 2023-05-24 MED ORDER — ALBUTEROL SULFATE (2.5 MG/3ML) 0.083% IN NEBU: 3.0000 mL | INHALATION_SOLUTION | RESPIRATORY_TRACT | Status: DC | PRN

## 2023-05-24 MED ORDER — IPRATROPIUM-ALBUTEROL 0.5-2.5 (3) MG/3ML IN SOLN
3.0000 mL | Freq: Four times a day (QID) | RESPIRATORY_TRACT | Status: DC
  Administered 2023-05-24 – 2023-05-25 (×3): 3 mL via RESPIRATORY_TRACT
  Filled 2023-05-24 (×3): qty 3

## 2023-05-24 NOTE — Progress Notes (Signed)
Progress Note   Patient: [name] [name] [id number]:[id number] DOB: September 13, 1970 DOA: 05/22/2023     1 DOS: the patient was seen and examined on 05/24/2023   Brief hospital course: [name] [name] was admitted to the hospital with the working diagnosis of heart failure decompensation.   52 yo female with the past medical history of COPD, heart failure, CKD, T2DM, and hyperlipidemia who presented with dyspnea. Reported worsening dyspnea over the last 10 days. Positive intermittent chest pain with worse with deep breathing and better with rest. On 12/04 she had a ED evaluation where pulmonary embolism was ruled out, and was discharged home. She has been not adherent to her cardiovascular medications, not taking any since 09/24. On her initial physical examination her blood pressure was 138/93, HR 112, RR 29 and 02 saturation 93%, lungs with bilateral rales, with no wheezing, heart with S1 and S2 present and rhythmic, abdomen with no distention and positive lower extremity edema.   Na 143, K 3,7 CL 112, bicarbonate 19, glucose 396, bun 14 cr 0.95 Mg 1.6  BNP 2,978 High sensitive troponin 26 and 29  Wbc 7.4 hgb 10.5 plt 169   Chest radiograph with cardiomegaly, with bilateral hilar vascular congestion and small left pleural effusion.   12/14 patient with improvement in volume status, lactic acid is trending down.   Assessment and Plan: Acute on chronic systolic CHF (congestive heart failure) (HCC) Echocardiogram with reduced LV systolic function EF 20 to 25%, global hypokinesis, mild LVH, RV systolic function preserved, LA with severe dilatation, moderate mitral valve regurgitation, trivial pericardial effusion.  Documented urine output is 2,400 cc Systolic blood pressure 101 to 110 mmHg.  Lactic acid is trending down.   Plan to continue diuresis with furosemide to 80 mg IV bid.  Continue with midodrine for blood pressure support and allow better diuresis.  Limited pharmacologic therapy due to  hypotension.  Follow up with limited echocardiogram.   Hypotension Continue with midodrine to prevent further hypotension.  CKD stage 3a, GFR 45-59 ml/min (HCC) AKI, hypokalemia. Hypomagnesemia.   Patient with improvement in volume status, renal function today with serum cr at 1,43 with K at 4.0 and serum bicarbonate at 29  Na 136  Mg 1.7   Plan to continue diuresis with furosemide Add 2 g mag today to prevent hypomagnesemia.  Follow up renal function and electrolytes in am.   DM2 (diabetes mellitus, type 2) (HCC) Continue insulin sliding scale for glucose cover and monitoring.  Reduce dose of basal insulin to 10 units to avoid hypoglycemia.  Glucose this am 210 Capillary glucose 198, 184, 271   COPD (chronic obstructive pulmonary disease) (HCC) No signs of acute exacerbation.  Continue bronchodilator therapy, add scheduled duoneb and continue with as needed albuterol.  Add incentive spirometer and flutter valve.          Subjective: Patient with improvement in dyspnea but not back to baseline, she is having increased sputum production.   Physical Exam: Vitals:   05/23/23 2300 05/24/23 0409 05/24/23 0510 05/24/23 0744  BP: (!) 86/53 107/65  110/80  Pulse: 80 84  84  Resp: 17 18  17   Temp: 98.6 F (37 C) 98.7 F (37.1 C)  98.1 F (36.7 C)  TempSrc: Oral Oral  Oral  SpO2: 96% 92%  91%  Weight:   75.7 kg   Height:       Neurology awake and alert ENT with mild pallor Cardiovascular with S1 and S2 present and regular with no gallops,, rubs  or murmurs Mild JVD No lower extremity edema Respiratory with bilateral rales and expiratory wheezing, no rhonchi Abdomen with no distention  Data Reviewed:    Family Communication: no family at the bedside   Disposition: Status is: Inpatient Remains inpatient appropriate because: IV diuresis   Planned Discharge Destination: Home     Author: Coralie Keens, MD 05/24/2023 8:50 AM  For on call review  [email or web address].

## 2023-05-24 NOTE — Plan of Care (Signed)
  Problem: Education: Goal: Ability to describe self-care measures that may prevent or decrease complications (Diabetes Survival Skills Education) will improve Outcome: Progressing Goal: Individualized Educational Video(s) Outcome: Progressing   Problem: Coping: Goal: Ability to adjust to condition or change in health will improve Outcome: Progressing   Problem: Fluid Volume: Goal: Ability to maintain a balanced intake and output will improve Outcome: Progressing   Problem: Health Behavior/Discharge Planning: Goal: Ability to identify and utilize available resources and services will improve Outcome: Progressing Goal: Ability to manage health-related needs will improve Outcome: Progressing   Problem: Metabolic: Goal: Ability to maintain appropriate glucose levels will improve Outcome: Progressing   Problem: Nutritional: Goal: Maintenance of adequate nutrition will improve Outcome: Progressing Goal: Progress toward achieving an optimal weight will improve Outcome: Progressing   Problem: Skin Integrity: Goal: Risk for impaired skin integrity will decrease Outcome: Progressing   Problem: Tissue Perfusion: Goal: Adequacy of tissue perfusion will improve Outcome: Progressing   Problem: Education: Goal: Knowledge of General Education information will improve Description: Including pain rating scale, medication(s)/side effects and non-pharmacologic comfort measures Outcome: Progressing   Problem: Health Behavior/Discharge Planning: Goal: Ability to manage health-related needs will improve Outcome: Progressing   Problem: Clinical Measurements: Goal: Ability to maintain clinical measurements within normal limits will improve Outcome: Progressing Goal: Will remain free from infection Outcome: Progressing Goal: Diagnostic test results will improve Outcome: Progressing Goal: Respiratory complications will improve Outcome: Progressing Goal: Cardiovascular complication will  be avoided Outcome: Progressing   Problem: Activity: Goal: Risk for activity intolerance will decrease Outcome: Progressing   Problem: Nutrition: Goal: Adequate nutrition will be maintained Outcome: Progressing   Problem: Coping: Goal: Level of anxiety will decrease Outcome: Progressing   Problem: Elimination: Goal: Will not experience complications related to bowel motility Outcome: Progressing Goal: Will not experience complications related to urinary retention Outcome: Progressing   Problem: Pain Management: Goal: General experience of comfort will improve Outcome: Progressing   Problem: Safety: Goal: Ability to remain free from injury will improve Outcome: Progressing   Problem: Skin Integrity: Goal: Risk for impaired skin integrity will decrease Outcome: Progressing   Problem: Education: Goal: Ability to demonstrate management of disease process will improve Outcome: Progressing Goal: Ability to verbalize understanding of medication therapies will improve Outcome: Progressing Goal: Individualized Educational Video(s) Outcome: Progressing   Problem: Activity: Goal: Capacity to carry out activities will improve Outcome: Progressing   Problem: Cardiac: Goal: Ability to achieve and maintain adequate cardiopulmonary perfusion will improve Outcome: Progressing

## 2023-05-24 NOTE — Evaluation (Addendum)
Physical Therapy Evaluation & Discharge Patient Details Name: [name] [name] MRN: [id number] DOB: 09-06-1970 Today's Date: 05/24/2023  History of Present Illness  52 y.o. female admitted 05/22/23 with chest pain, DOE; pt reports not taking any cardiovascular meds since 02/2023. Workup for CHF, AKI. PMH includes COPD, CHF, CKD, DM2, HLD.   Clinical Impression  Patient evaluated by Physical Therapy with no further acute PT needs identified. PTA, pt independent, sedentary, lives in apartment with roommate; pt does not drive, attributes this as main issue why she is not taking medications as prescribed because she cannot get them. Today, pt indep with mobility and self-care tasks. Educ on activity recommendations for CHF and energy conservation strategies. Reached out to SW regarding transport/medication concerns. All education has been completed and the patient has no further questions. Acute PT is signing off. Thank you for this referral.    SpO2 97% on RA, HR 86     If plan is discharge home, recommend the following: Assist for transportation;Direct supervision/assist for medications management   Can travel by private vehicle    Yes    Equipment Recommendations None recommended by PT  Recommendations for Other Services   Social Worker regarding transportation/resources for getting medications   Functional Status Assessment       Precautions / Restrictions Precautions Precautions: None Restrictions Weight Bearing Restrictions Per Provider Order: No      Mobility  Bed Mobility Overal bed mobility: Modified Independent                  Transfers Overall transfer level: Independent Equipment used: None                    Ambulation/Gait Ambulation/Gait assistance: Independent Gait Distance (Feet): 60 Feet Assistive device: None Gait Pattern/deviations: Step-through pattern, Decreased stride length Gait velocity: Decreased     General Gait Details: slow,  steady gait indep without DME, no significant DOE noted; pt declines further distance secondary to fatigue  Stairs            Wheelchair Mobility     Tilt Bed    Modified Rankin (Stroke Patients Only)       Balance Overall balance assessment: No apparent balance deficits (not formally assessed)                                           Pertinent Vitals/Pain Pain Assessment Pain Assessment: Faces Faces Pain Scale: Hurts little more Pain Location: chronic low back and L knee pain Pain Descriptors / Indicators: Discomfort, Constant Pain Intervention(s): Monitored during session, Limited activity within patient's tolerance    Home Living Family/patient expects to be discharged to:: Private residence Living Arrangements: Non-relatives/Friends Available Help at Discharge: Friend(s);Available PRN/intermittently Type of Home: Apartment Home Access: Level entry       Home Layout: One level Home Equipment: None      Prior Function Prior Level of Function : Independent/Modified Independent             Mobility Comments: independent without DME ADLs Comments: independent. does not drive; reports having difficulty getting medications     Extremity/Trunk Assessment   Upper Extremity Assessment Upper Extremity Assessment: Overall WFL for tasks assessed    Lower Extremity Assessment Lower Extremity Assessment: LLE deficits/detail LLE Deficits / Details: reports h/o chronic L knee injury ("it needs to be replaced but I keep putting  it off"), gross knee hip/ext 4-/5       Communication   Communication Communication: No apparent difficulties  Cognition Arousal: Alert Behavior During Therapy: Flat affect Overall Cognitive Status: Within Functional Limits for tasks assessed                                 General Comments: WFL for simple tasks, not formally assessed. flat affect, decreased awareness/insight into medical  condition        General Comments General comments (skin integrity, edema, etc.): pt reports sedentary due to SOB; educ on activity recommendations for CHF and energy conservation strategies for home, HEP provided for generalized BLE strengthening. asked pt about medication compliance, pt reports noncompliant because she does not drive and cannot consistently get medicines; reached out to SW for potential resources for this    Exercises     Assessment/Plan    PT Assessment Patient does not need any further PT services  PT Problem List         PT Treatment Interventions      PT Goals (Current goals can be found in the Care Plan section)  Acute Rehab PT Goals PT Goal Formulation: All assessment and education complete, DC therapy    Frequency       Co-evaluation               AM-PAC PT "6 Clicks" Mobility  Outcome Measure Help needed turning from your back to your side while in a flat bed without using bedrails?: None Help needed moving from lying on your back to sitting on the side of a flat bed without using bedrails?: None Help needed moving to and from a bed to a chair (including a wheelchair)?: None Help needed standing up from a chair using your arms (e.g., wheelchair or bedside chair)?: None Help needed to walk in hospital room?: None Help needed climbing 3-5 steps with a railing? : None 6 Click Score: 24    End of Session   Activity Tolerance: Patient limited by fatigue;Patient tolerated treatment well Patient left: in bed;with call bell/phone within reach Nurse Communication: Mobility status PT Visit Diagnosis: Other abnormalities of gait and mobility (R26.89)    Time: 9629-5284 PT Time Calculation (min) (ACUTE ONLY): 15 min   Charges:   PT Evaluation $PT Eval Low Complexity: 1 Low   PT General Charges $$ ACUTE PT VISIT: 1 Visit       Ina Homes, PT, DPT Acute Rehabilitation Services  Personal: Secure Chat Rehab Office:  [phone]  Malachy Chamber 05/24/2023, 3:19 PM

## 2023-05-24 NOTE — Plan of Care (Signed)
  Problem: Education: Goal: Ability to describe self-care measures that may prevent or decrease complications (Diabetes Survival Skills Education) will improve Outcome: Progressing   Problem: Fluid Volume: Goal: Ability to maintain a balanced intake and output will improve Outcome: Progressing   Problem: Health Behavior/Discharge Planning: Goal: Ability to manage health-related needs will improve Outcome: Progressing   Problem: Education: Goal: Knowledge of General Education information will improve Description: Including pain rating scale, medication(s)/side effects and non-pharmacologic comfort measures Outcome: Progressing

## 2023-05-24 NOTE — Progress Notes (Signed)
Mobility Specialist Progress Note    05/24/23 0944  Mobility  Activity Ambulated with assistance in hallway  Level of Assistance Contact guard assist, steadying assist  Assistive Device Other (Comment) (HHA)  Distance Ambulated (ft) 240 ft  Activity Response Tolerated fair  Mobility Referral Yes  Mobility visit 1 Mobility  Mobility Specialist Start Time (ACUTE ONLY) [id number]  Mobility Specialist Stop Time (ACUTE ONLY) 0943  Mobility Specialist Time Calculation (min) (ACUTE ONLY) 7 min   Pre-Mobility: 90 HR During Mobility: 101 HR  Pt received in bed and agreeable. C/o 8/10 CP that gets sharper with breathing during session. Pt stated RN already aware. Returned to bed with call bell in reach.   [place] Mobility Specialist  Please Neurosurgeon or Rehab Office at [phone]

## 2023-05-25 ENCOUNTER — Inpatient Hospital Stay (HOSPITAL_COMMUNITY): Payer: BLUE CROSS/BLUE SHIELD

## 2023-05-25 DIAGNOSIS — R0609 Other forms of dyspnea: Secondary | ICD-10-CM

## 2023-05-25 DIAGNOSIS — I5023 Acute on chronic systolic (congestive) heart failure: Secondary | ICD-10-CM | POA: Diagnosis not present

## 2023-05-25 DIAGNOSIS — E119 Type 2 diabetes mellitus without complications: Secondary | ICD-10-CM | POA: Diagnosis not present

## 2023-05-25 DIAGNOSIS — E861 Hypovolemia: Secondary | ICD-10-CM | POA: Diagnosis not present

## 2023-05-25 DIAGNOSIS — N1831 Chronic kidney disease, stage 3a: Secondary | ICD-10-CM | POA: Diagnosis not present

## 2023-05-25 DIAGNOSIS — G894 Chronic pain syndrome: Secondary | ICD-10-CM

## 2023-05-25 LAB — MAGNESIUM: Magnesium: 1.7 mg/dL (ref 1.7–2.4)

## 2023-05-25 LAB — GLUCOSE, CAPILLARY
Glucose-Capillary: 221 mg/dL — ABNORMAL HIGH (ref 70–99)
Glucose-Capillary: 202 mg/dL — ABNORMAL HIGH (ref 70–99)
Glucose-Capillary: 219 mg/dL — ABNORMAL HIGH (ref 70–99)
Glucose-Capillary: 198 mg/dL — ABNORMAL HIGH (ref 70–99)

## 2023-05-25 LAB — ECHOCARDIOGRAM LIMITED
Area-P 1/2: 7.16 cm2
Height: 65 in
MV M vel: 4.12 m/s
MV Peak grad: 67.9 mm[Hg]
Radius: 0.5 cm
S' Lateral: 6.5 cm
Weight: 2574.97 [oz_av]

## 2023-05-25 LAB — BASIC METABOLIC PANEL
Anion gap: 13 (ref 5–15)
BUN: 35 mg/dL — ABNORMAL HIGH (ref 6–20)
CO2: 33 mmol/L — ABNORMAL HIGH (ref 22–32)
Calcium: 8.2 mg/dL — ABNORMAL LOW (ref 8.9–10.3)
Chloride: 92 mmol/L — ABNORMAL LOW (ref 98–111)
Creatinine, Ser: 1.39 mg/dL — ABNORMAL HIGH (ref 0.44–1.00)
GFR, Estimated: 45 mL/min — ABNORMAL LOW (ref >60)
Glucose, Bld: 180 mg/dL — ABNORMAL HIGH (ref 70–99)
Potassium: 3.3 mmol/L — ABNORMAL LOW (ref 3.5–5.1)
Sodium: 138 mmol/L (ref 135–145)

## 2023-05-25 MED ORDER — OXYCODONE HCL 5 MG PO TABS
5.0000 mg | ORAL_TABLET | Freq: Four times a day (QID) | ORAL | Status: DC | PRN
  Administered 2023-05-25 – 2023-05-27 (×6): 5 mg via ORAL
  Filled 2023-05-25 (×6): qty 1

## 2023-05-25 MED ORDER — POTASSIUM CHLORIDE CRYS ER 20 MEQ PO TBCR
40.0000 meq | EXTENDED_RELEASE_TABLET | ORAL | Status: AC
  Administered 2023-05-25 (×2): 40 meq via ORAL
  Filled 2023-05-25 (×2): qty 2

## 2023-05-25 MED ORDER — SODIUM CHLORIDE 0.9 % IV SOLN: INTRAVENOUS | Status: AC | PRN

## 2023-05-25 MED ORDER — MAGNESIUM SULFATE 4 GM/100ML IV SOLN
4.0000 g | Freq: Once | INTRAVENOUS | Status: AC
  Administered 2023-05-25: 4 g via INTRAVENOUS
  Filled 2023-05-25: qty 100

## 2023-05-25 MED ORDER — HYDROMORPHONE HCL 1 MG/ML IJ SOLN
0.5000 mg | INTRAMUSCULAR | Status: DC | PRN
  Administered 2023-05-25 – 2023-05-27 (×7): 0.5 mg via INTRAVENOUS
  Filled 2023-05-25 (×7): qty 0.5

## 2023-05-25 MED ORDER — OXYCODONE-ACETAMINOPHEN 5-325 MG PO TABS
1.0000 | ORAL_TABLET | Freq: Four times a day (QID) | ORAL | Status: DC | PRN
  Administered 2023-05-25 – 2023-05-27 (×6): 1 via ORAL
  Filled 2023-05-25 (×6): qty 1

## 2023-05-25 MED ORDER — IPRATROPIUM-ALBUTEROL 0.5-2.5 (3) MG/3ML IN SOLN: 3.0000 mL | Freq: Four times a day (QID) | RESPIRATORY_TRACT | Status: DC | PRN

## 2023-05-25 NOTE — Evaluation (Signed)
Occupational Therapy Evaluation Patient Details Name: [name] [name] MRN: [id number] DOB: 12-21-1970 Today's Date: 05/25/2023   History of Present Illness 52 y.o. female admitted 05/22/23 with chest pain, DOE; pt reports not taking any cardiovascular meds since 02/2023. Workup for CHF, AKI. PMH includes COPD, CHF, CKD, DM2, HLD.   Clinical Impression   PTA, pt was living with her roommate and independent; very little activity during the day. Pt currently performing at Mod I level with increased time due to fatigue. Pt presenting close to baseline function. Pt performing sponge bath at sink, functional mobility in hallway, and IADL (changing bed linens). Pt would benefit from one additional OT session for [employer] education. Recommend dc to home once medically stable per physician.        If plan is discharge home, recommend the following:      Functional Status Assessment  Patient has had a recent decline in their functional status and demonstrates the ability to make significant improvements in function in a reasonable and predictable amount of time.  Equipment Recommendations  None recommended by OT    Recommendations for Other Services       Precautions / Restrictions Precautions Precautions: None Restrictions Weight Bearing Restrictions Per Provider Order: No      Mobility Bed Mobility Overal bed mobility: Modified Independent                  Transfers Overall transfer level: Independent Equipment used: None                      Balance Overall balance assessment: No apparent balance deficits (not formally assessed)                                         ADL either performed or assessed with clinical judgement   ADL Overall ADL's : Modified independent                                       General ADL Comments: Mod I for increased time as needed. Pt performing sponge bath at sink, functional mobility in hallway, and  assisting in changing linens on bed.     Vision         Perception         Praxis         Pertinent Vitals/Pain Pain Assessment Pain Assessment: Faces Faces Pain Scale: Hurts little more Pain Location: chronic low back and L knee pain Pain Descriptors / Indicators: Discomfort, Constant Pain Intervention(s): Monitored during session, Limited activity within patient's tolerance, Repositioned     Extremity/Trunk Assessment Upper Extremity Assessment Upper Extremity Assessment: Overall WFL for tasks assessed   Lower Extremity Assessment Lower Extremity Assessment: LLE deficits/detail LLE Deficits / Details: reports h/o chronic L knee injury ("it needs to be replaced but I keep putting it off"), gross knee hip/ext 4-/5       Communication Communication Communication: No apparent difficulties   Cognition Arousal: Alert Behavior During Therapy: Flat affect Overall Cognitive Status: Within Functional Limits for tasks assessed                                 General Comments: Pt with self limiting behaviors and decreased volition.  Upon first attempt in AM, pt declining mobility. However, in afternoon, pt more agreeable to engaged during session     General Comments  Notified RN that tele battery low as well as possible infiltration of IV site at L hand.     Exercises     Shoulder Instructions      Home Living Family/patient expects to be discharged to:: Private residence Living Arrangements: Non-relatives/Friends Available Help at Discharge: Friend(s);Available PRN/intermittently Type of Home: Apartment Home Access: Level entry     Home Layout: One level     Bathroom Shower/Tub: Chief Strategy Officer: Standard     Home Equipment: None          Prior Functioning/Environment Prior Level of Function : Independent/Modified Independent             Mobility Comments: independent without DME ADLs Comments: independent. does  not drive; reports having difficulty getting medications        OT Problem List: Decreased activity tolerance      OT Treatment/Interventions:      OT Goals(Current goals can be found in the care plan section) Acute Rehab OT Goals Patient Stated Goal: Reduce pain OT Goal Formulation: With patient Time For Goal Achievement: 06/08/23 Potential to Achieve Goals: Good  OT Frequency: Min 1X/week    Co-evaluation              AM-PAC OT "6 Clicks" Daily Activity     Outcome Measure Help from another person eating meals?: None Help from another person taking care of personal grooming?: None Help from another person toileting, which includes using toliet, bedpan, or urinal?: None Help from another person bathing (including washing, rinsing, drying)?: None Help from another person to put on and taking off regular upper body clothing?: None Help from another person to put on and taking off regular lower body clothing?: None 6 Click Score: 24   End of Session Nurse Communication: Mobility status  Activity Tolerance: Patient tolerated treatment well Patient left: in bed;with call bell/phone within reach  OT Visit Diagnosis: Muscle weakness (generalized) (M62.81)                Time: 1610-9604 OT Time Calculation (min): 26 min Charges:  OT General Charges $OT Visit: 1 Visit OT Evaluation $OT Eval Low Complexity: 1 Low OT Treatments $Self Care/Home Management : 8-22 mins  Ishita Mcnerney MSOT, OTR/L Acute Rehab Office: [phone]  Theodoro Grist Nymir Ringler 05/25/2023, 3:17 PM

## 2023-05-25 NOTE — Plan of Care (Signed)
  Problem: Education: Goal: Ability to describe self-care measures that may prevent or decrease complications (Diabetes Survival Skills Education) will improve Outcome: Progressing Goal: Individualized Educational Video(s) Outcome: Progressing   Problem: Fluid Volume: Goal: Ability to maintain a balanced intake and output will improve Outcome: Progressing   Problem: Health Behavior/Discharge Planning: Goal: Ability to identify and utilize available resources and services will improve Outcome: Progressing   Problem: Coping: Goal: Ability to adjust to condition or change in health will improve Outcome: Not Progressing

## 2023-05-25 NOTE — Assessment & Plan Note (Addendum)
Depression.   Patient had a recent hospitalization in behavioral health and she was placed on lurasidone 60 mg.  Will give a new prescription for this medications and she will need close follow up as outpatient.   She required analgesics oral oxycodone and for break through pain IV hydromorphone during her hospitalization.

## 2023-05-25 NOTE — Progress Notes (Signed)
  Echocardiogram 2D Echocardiogram has been performed.  [name] [name] 05/25/2023, 5:30 PM

## 2023-05-25 NOTE — Progress Notes (Signed)
Progress Note   Patient: [name] [name]:[id number] DOB: 07-28-1970 DOA: 05/22/2023     2 DOS: the patient was seen and examined on 05/25/2023   Brief hospital course: [name] was admitted to the hospital with the working diagnosis of heart failure decompensation.   52 yo female with the past medical history of COPD, heart failure, CKD, T2DM, and hyperlipidemia who presented with dyspnea. Reported worsening dyspnea over the last 10 days. Positive intermittent chest pain with worse with deep breathing and better with rest. On 12/04 she had a ED evaluation where pulmonary embolism was ruled out, and was discharged home. She has been not adherent to her cardiovascular medications, not taking any since 09/24. On her initial physical examination her blood pressure was 138/93, HR 112, RR 29 and 02 saturation 93%, lungs with bilateral rales, with no wheezing, heart with S1 and S2 present and rhythmic, abdomen with no distention and positive lower extremity edema.   Na 143, K 3,7 CL 112, bicarbonate 19, glucose 396, bun 14 cr 0.95 Mg 1.6  BNP 2,978 High sensitive troponin 26 and 29  Wbc 7.4 hgb 10.5 plt 169   Chest radiograph with cardiomegaly, with bilateral hilar vascular congestion and small left pleural effusion.   12/14 patient with improvement in volume status, lactic acid is trending down.  12/15 responding to diuresis, not yet back to baseline.   Assessment and Plan: Acute on chronic systolic CHF (congestive heart failure) (HCC) Echocardiogram with reduced LV systolic function EF 20 to 25%, global hypokinesis, mild LVH, RV systolic function preserved, LA with severe dilatation, moderate mitral valve regurgitation, trivial pericardial effusion.  Documented urine output is 1,300 cc She has lost 2 Kg over the last 24 hrs.  Systolic blood pressure 100 mmHg range  Lactic acid is trending down.   Plan to continue diuresis with furosemide to 80 mg IV bid.  Continue with midodrine  for blood pressure support and allow better diuresis.  Limited pharmacologic therapy due to hypotension.  Follow limited echocardiogram.   Hypotension Continue with midodrine to prevent further hypotension.  CKD stage 3a, GFR 45-59 ml/min (HCC) AKI, hypokalemia. Hypomagnesemia.   Renal function with serum cr at 1.39 with K at 3,3 and serum bicarbonate at 33.  Na 138 and Mg 1.7   Plan to continue diuresis with furosemide Electrolyte correction with Kcl 40 meq x2 doses and 4 g mag sulfate. Follow up renal function and electrolytes in am.   DM2 (diabetes mellitus, type 2) (HCC) Continue insulin sliding scale for glucose cover and monitoring.  Continue with basal insulin to 10 units  Fasting glucose this am 180 mg/dl.   COPD (chronic obstructive pulmonary disease) (HCC) No signs of acute exacerbation.  Continue bronchodilator therapy, add scheduled duoneb and continue with as needed albuterol.  Continue with incentive spirometer and flutter valve.    Chronic pain syndrome Continue with analgesics.  Out of bed to chair tid with meals        Subjective: Patient is not feeling as well as yesterday, continue to have dyspnea, no PND or orthopnea   Physical Exam: Vitals:   05/25/23 0516 05/25/23 0740 05/25/23 0753 05/25/23 1053  BP:   (!) 92/56 (!) 100/58  Pulse:   75 74  Resp:   18 20  Temp:   98.5 F (36.9 C) 98.4 F (36.9 C)  TempSrc:   Oral Oral  SpO2:  93% 94% 94%  Weight: 73 kg     Height:  Neurology awake and alert ENT with mild pallor Cardiovascular with S1 and S2 present and regular with no gallops, rubs or murmurs Positive JVD No lower extremity edema (warm lower extremities) Respiratory with mild rales at bases with no wheezing or rhonchi Abdomen with no distention  Data Reviewed:    Family Communication: no family at the bedside   Disposition: Status is: Inpatient Remains inpatient appropriate because: diuresis   Planned Discharge  Destination: Home     Author: Coralie Keens, MD 05/25/2023 1:43 PM  For on call review [email or web address].

## 2023-05-25 NOTE — Plan of Care (Signed)
  Problem: Education: Goal: Ability to describe self-care measures that may prevent or decrease complications (Diabetes Survival Skills Education) will improve Outcome: Progressing Goal: Individualized Educational Video(s) Outcome: Progressing   Problem: Coping: Goal: Ability to adjust to condition or change in health will improve Outcome: Progressing   Problem: Fluid Volume: Goal: Ability to maintain a balanced intake and output will improve Outcome: Progressing   Problem: Health Behavior/Discharge Planning: Goal: Ability to identify and utilize available resources and services will improve Outcome: Progressing Goal: Ability to manage health-related needs will improve Outcome: Progressing   Problem: Metabolic: Goal: Ability to maintain appropriate glucose levels will improve Outcome: Progressing   Problem: Nutritional: Goal: Maintenance of adequate nutrition will improve Outcome: Progressing Goal: Progress toward achieving an optimal weight will improve Outcome: Progressing   Problem: Skin Integrity: Goal: Risk for impaired skin integrity will decrease Outcome: Progressing   Problem: Tissue Perfusion: Goal: Adequacy of tissue perfusion will improve Outcome: Progressing   Problem: Education: Goal: Knowledge of General Education information will improve Description: Including pain rating scale, medication(s)/side effects and non-pharmacologic comfort measures Outcome: Progressing   Problem: Health Behavior/Discharge Planning: Goal: Ability to manage health-related needs will improve Outcome: Progressing   Problem: Clinical Measurements: Goal: Ability to maintain clinical measurements within normal limits will improve Outcome: Progressing Goal: Will remain free from infection Outcome: Progressing Goal: Diagnostic test results will improve Outcome: Progressing Goal: Respiratory complications will improve Outcome: Progressing Goal: Cardiovascular complication will  be avoided Outcome: Progressing   Problem: Activity: Goal: Risk for activity intolerance will decrease Outcome: Progressing   Problem: Nutrition: Goal: Adequate nutrition will be maintained Outcome: Progressing   Problem: Coping: Goal: Level of anxiety will decrease Outcome: Progressing   Problem: Elimination: Goal: Will not experience complications related to bowel motility Outcome: Progressing Goal: Will not experience complications related to urinary retention Outcome: Progressing   Problem: Pain Management: Goal: General experience of comfort will improve Outcome: Progressing   Problem: Safety: Goal: Ability to remain free from injury will improve Outcome: Progressing   Problem: Skin Integrity: Goal: Risk for impaired skin integrity will decrease Outcome: Progressing   Problem: Education: Goal: Ability to demonstrate management of disease process will improve Outcome: Progressing Goal: Ability to verbalize understanding of medication therapies will improve Outcome: Progressing Goal: Individualized Educational Video(s) Outcome: Progressing   Problem: Activity: Goal: Capacity to carry out activities will improve Outcome: Progressing   Problem: Cardiac: Goal: Ability to achieve and maintain adequate cardiopulmonary perfusion will improve Outcome: Progressing

## 2023-05-26 DIAGNOSIS — I5023 Acute on chronic systolic (congestive) heart failure: Secondary | ICD-10-CM | POA: Diagnosis not present

## 2023-05-26 DIAGNOSIS — E861 Hypovolemia: Secondary | ICD-10-CM | POA: Diagnosis not present

## 2023-05-26 DIAGNOSIS — E119 Type 2 diabetes mellitus without complications: Secondary | ICD-10-CM | POA: Diagnosis not present

## 2023-05-26 DIAGNOSIS — N1831 Chronic kidney disease, stage 3a: Secondary | ICD-10-CM | POA: Diagnosis not present

## 2023-05-26 LAB — BASIC METABOLIC PANEL
Anion gap: 8 (ref 5–15)
BUN: 31 mg/dL — ABNORMAL HIGH (ref 6–20)
CO2: 32 mmol/L (ref 22–32)
Calcium: 8.3 mg/dL — ABNORMAL LOW (ref 8.9–10.3)
Chloride: 96 mmol/L — ABNORMAL LOW (ref 98–111)
Creatinine, Ser: 1.19 mg/dL — ABNORMAL HIGH (ref 0.44–1.00)
GFR, Estimated: 55 mL/min — ABNORMAL LOW (ref >60)
Glucose, Bld: 204 mg/dL — ABNORMAL HIGH (ref 70–99)
Potassium: 3.7 mmol/L (ref 3.5–5.1)
Sodium: 136 mmol/L (ref 135–145)

## 2023-05-26 LAB — GLUCOSE, CAPILLARY
Glucose-Capillary: 149 mg/dL — ABNORMAL HIGH (ref 70–99)
Glucose-Capillary: 274 mg/dL — ABNORMAL HIGH (ref 70–99)
Glucose-Capillary: 121 mg/dL — ABNORMAL HIGH (ref 70–99)
Glucose-Capillary: 193 mg/dL — ABNORMAL HIGH (ref 70–99)

## 2023-05-26 LAB — MAGNESIUM: Magnesium: 2.6 mg/dL — ABNORMAL HIGH (ref 1.7–2.4)

## 2023-05-26 MED ORDER — POTASSIUM CHLORIDE CRYS ER 20 MEQ PO TBCR
40.0000 meq | EXTENDED_RELEASE_TABLET | Freq: Once | ORAL | Status: AC
  Administered 2023-05-26: 40 meq via ORAL
  Filled 2023-05-26: qty 2

## 2023-05-26 MED ORDER — MIDODRINE HCL 5 MG PO TABS
10.0000 mg | ORAL_TABLET | Freq: Three times a day (TID) | ORAL | Status: DC
  Administered 2023-05-27 (×2): 10 mg via ORAL
  Filled 2023-05-26 (×2): qty 2

## 2023-05-26 MED ORDER — MIDODRINE HCL 5 MG PO TABS
5.0000 mg | ORAL_TABLET | Freq: Once | ORAL | Status: AC
  Administered 2023-05-26: 5 mg via ORAL
  Filled 2023-05-26: qty 1

## 2023-05-26 MED ORDER — HYDROMORPHONE HCL 1 MG/ML IJ SOLN
0.2500 mg | Freq: Once | INTRAMUSCULAR | Status: AC
  Administered 2023-05-26: 0.25 mg via INTRAVENOUS
  Filled 2023-05-26: qty 0.5

## 2023-05-26 NOTE — Progress Notes (Signed)
Progress Note   Patient: [name] [name] ZOX:[id number] DOB: [birth year] DOA: 05/22/2023     3 DOS: the patient was seen and examined on 05/26/2023   Brief hospital course: [name] [name] was admitted to the hospital with the working diagnosis of heart failure decompensation.   52 yo female with the past medical history of COPD, heart failure, CKD, T2DM, and hyperlipidemia who presented with dyspnea. Reported worsening dyspnea over the last 10 days. Positive intermittent chest pain with worse with deep breathing and better with rest. On 12/04 she had a ED evaluation where pulmonary embolism was ruled out, and was discharged home. She has been not adherent to her cardiovascular medications, not taking any since 09/24. On her initial physical examination her blood pressure was 138/93, HR 112, RR 29 and 02 saturation 93%, lungs with bilateral rales, with no wheezing, heart with S1 and S2 present and rhythmic, abdomen with no distention and positive lower extremity edema.   Na 143, K 3,7 CL 112, bicarbonate 19, glucose 396, bun 14 cr 0.95 Mg 1.6  BNP 2,978 High sensitive troponin 26 and 29  Wbc 7.4 hgb 10.5 plt 169   Chest radiograph with cardiomegaly, with bilateral hilar vascular congestion and small left pleural effusion.   12/14 patient with improvement in volume status, lactic acid is trending down.  12/15 responding to diuresis, not yet back to baseline.  12/16 improved volume status, plan to transition to oral loop diuretic tomorrow.   Assessment and Plan: Acute on chronic systolic CHF (congestive heart failure) (HCC) Echocardiogram (limited) with decreased LV systolic function with EF 20 to 25%, global hypokinesis, LV cavity dilatated, RV systolic function preserved, LA with dilatated, severe mitral regurgitation.   Urine output is 2,550  ml  Systolic blood pressure 90 to 811 mmHg range, with lower to  82 this morning.  Lactic acid is trending down.   Continue with midodrine for blood  pressure support Patient had 80 mg IV furosemide this morning will transition to oral loop diuretic in am.  Limited pharmacologic therapy due to hypotension.   Hypotension Continue with midodrine to prevent further hypotension.  CKD stage 3a, GFR 45-59 ml/min (HCC) AKI, hypokalemia. Hypomagnesemia.   Improved volume status, renal function with serum cr at 1,19 with K at 3,7 and serum bicarbonate at 32,  Na 136 and Mg 2.6   Add 40 meq Kcl today.  Transition to oral loop diuretic in am,  Follow up renal function and electrolytes in am.   DM2 (diabetes mellitus, type 2) (HCC) Uncontrolled hyperglycemia.   Continue insulin sliding scale for glucose cover and monitoring.  Continue with basal insulin to 10 units  Fasting glucose this am 204  mg/dl.   COPD (chronic obstructive pulmonary disease) (HCC) No signs of acute exacerbation.  Continue bronchodilator therapy, add scheduled duoneb and continue with as needed albuterol.  Continue with incentive spirometer and flutter valve.    Chronic pain syndrome Depression.   Continue with sertraline.  Continue with analgesics oral oxycodone and for break through pain IV hydromorphone.  Out of bed to chair tid with meals        Subjective: Patient with dizziness today, with no chest pain, no orthopnea or PND, dyspnea has improved.   Physical Exam: Vitals:   05/26/23 0715 05/26/23 0820 05/26/23 1048 05/26/23 1147  BP: (!) 86/50  (!) 91/55 (!) 82/59  Pulse: 72  78   Resp: 16  16   Temp: 98.1 F (36.7 C)  98.3 F (36.8  C)   TempSrc: Oral  Oral   SpO2:  97%    Weight:      Height:       Neurology awake and alert ENT with mild pallor Cardiovascular with S1 and S2 present and regular with no gallops, rubs or murmurs No JVD No lower extremity edema Respiratory with no rales or wheezing, no rhonchi Abdomen with no distention  Data Reviewed:   Family Communication: no family at the bedside   Disposition: Status is:  Inpatient Remains inpatient appropriate because: heart failure recovering   Planned Discharge Destination: Home     Author: Coralie Keens, MD 05/26/2023 2:54 PM  For on call review [email or web address].

## 2023-05-26 NOTE — Plan of Care (Signed)
  Problem: Education: Goal: Ability to describe self-care measures that may prevent or decrease complications (Diabetes Survival Skills Education) will improve Outcome: Progressing Goal: Individualized Educational Video(s) Outcome: Progressing   Problem: Coping: Goal: Ability to adjust to condition or change in health will improve Outcome: Progressing   Problem: Fluid Volume: Goal: Ability to maintain a balanced intake and output will improve Outcome: Progressing   Problem: Health Behavior/Discharge Planning: Goal: Ability to identify and utilize available resources and services will improve Outcome: Progressing Goal: Ability to manage health-related needs will improve Outcome: Progressing   Problem: Metabolic: Goal: Ability to maintain appropriate glucose levels will improve Outcome: Progressing   Problem: Nutritional: Goal: Maintenance of adequate nutrition will improve Outcome: Progressing Goal: Progress toward achieving an optimal weight will improve Outcome: Progressing   Problem: Skin Integrity: Goal: Risk for impaired skin integrity will decrease Outcome: Progressing   Problem: Tissue Perfusion: Goal: Adequacy of tissue perfusion will improve Outcome: Progressing   Problem: Education: Goal: Knowledge of General Education information will improve Description: Including pain rating scale, medication(s)/side effects and non-pharmacologic comfort measures Outcome: Progressing   Problem: Health Behavior/Discharge Planning: Goal: Ability to manage health-related needs will improve Outcome: Progressing   Problem: Clinical Measurements: Goal: Ability to maintain clinical measurements within normal limits will improve Outcome: Progressing Goal: Will remain free from infection Outcome: Progressing Goal: Diagnostic test results will improve Outcome: Progressing Goal: Respiratory complications will improve Outcome: Progressing Goal: Cardiovascular complication will  be avoided Outcome: Progressing   Problem: Activity: Goal: Risk for activity intolerance will decrease Outcome: Progressing   Problem: Nutrition: Goal: Adequate nutrition will be maintained Outcome: Progressing   Problem: Coping: Goal: Level of anxiety will decrease Outcome: Progressing   Problem: Elimination: Goal: Will not experience complications related to bowel motility Outcome: Progressing Goal: Will not experience complications related to urinary retention Outcome: Progressing   Problem: Pain Management: Goal: General experience of comfort will improve Outcome: Progressing   Problem: Safety: Goal: Ability to remain free from injury will improve Outcome: Progressing   Problem: Skin Integrity: Goal: Risk for impaired skin integrity will decrease Outcome: Progressing   Problem: Education: Goal: Ability to demonstrate management of disease process will improve Outcome: Progressing Goal: Ability to verbalize understanding of medication therapies will improve Outcome: Progressing Goal: Individualized Educational Video(s) Outcome: Progressing   Problem: Activity: Goal: Capacity to carry out activities will improve Outcome: Progressing   Problem: Cardiac: Goal: Ability to achieve and maintain adequate cardiopulmonary perfusion will improve Outcome: Progressing

## 2023-05-26 NOTE — Inpatient Diabetes Management (Addendum)
Inpatient Diabetes Program Recommendations  AACE/ADA: New Consensus Statement on Inpatient Glycemic Control (2015)  Target Ranges:  Prepandial:   less than 140 mg/dL      Peak postprandial:   less than 180 mg/dL (1-2 hours)      Critically ill patients:  140 - 180 mg/dL   Lab Results  Component Value Date   GLUCAP 149 (H) 05/26/2023   HGBA1C 8.2 (H) 05/22/2023    Review of Glycemic Control  Latest Reference Range & Units 05/25/23 05:51 05/25/23 10:50 05/25/23 17:19 05/25/23 20:45 05/26/23 06:03  Glucose-Capillary 70 - 99 mg/dL 086 (H) 578 (H) 469 (H) 198 (H) 149 (H)   Diabetes history: DM 2 Outpatient Diabetes medications:  Toujeo 28 units daily Current orders for Inpatient glycemic control:  Novolog 0-15 units tid with meals and HS Semglee 10 units daily  Inpatient Diabetes Program Recommendations:    Consider increasing Semglee to 15 units daily.   Addendum 1500- spoke with patient.  She states that the reason she stopped taking insulin is because she ran out.  She will need new prescription at discharge.  She was very sweaty and states she feels poorly.  Reported to RN at desk.    Thanks,  Beryl Meager, RN, BC-ADM Inpatient Diabetes Coordinator Pager [phone]  (8a-5p)

## 2023-05-26 NOTE — Progress Notes (Signed)
Dr. Ella Jubilee made aware of patient's low B/P 82/59. Also made aware that patient continues to complain of back radiating to chest pain and dizziness as well.

## 2023-05-26 NOTE — Progress Notes (Signed)
Dr. Ella Jubilee made aware of patient's continued low B/P, diaphoresis and complaints of back/chest pain. New orders received for midrodine and dilaudid.

## 2023-05-26 NOTE — Progress Notes (Signed)
Pharmacist Heart Failure Core Measure Documentation  Assessment: [name] [name] has an EF documented as 20-25% on 05/25/23 by ECHO.  Rationale: Heart failure patients with left ventricular systolic dysfunction (LVSD) and an EF < 40% should be prescribed an angiotensin converting enzyme inhibitor (ACEI) or angiotensin receptor blocker (ARB) at discharge unless a contraindication is documented in the medical record.  This patient is not currently on an ACEI or ARB for HF.  This note is being placed in the record in order to provide documentation that a contraindication to the use of these agents is present for this encounter.  ACE Inhibitor or Angiotensin Receptor Blocker is contraindicated (specify all that apply)  []   ACEI allergy AND ARB allergy []   Angioedema []   Moderate or severe aortic stenosis []   Hyperkalemia [x]   Hypotension []   Renal artery stenosis []   Worsening renal function, preexisting renal disease or dysfunction   Thank you for involving pharmacy in this patient's care.  Loura Back, PharmD, BCPS Clinical Pharmacist Clinical phone for 05/26/2023 is [phone] 05/26/2023 7:11 AM

## 2023-05-27 ENCOUNTER — Other Ambulatory Visit (HOSPITAL_COMMUNITY): Payer: Self-pay

## 2023-05-27 DIAGNOSIS — N1831 Chronic kidney disease, stage 3a: Secondary | ICD-10-CM | POA: Diagnosis not present

## 2023-05-27 DIAGNOSIS — I5023 Acute on chronic systolic (congestive) heart failure: Secondary | ICD-10-CM | POA: Diagnosis not present

## 2023-05-27 DIAGNOSIS — E861 Hypovolemia: Secondary | ICD-10-CM | POA: Diagnosis not present

## 2023-05-27 DIAGNOSIS — E119 Type 2 diabetes mellitus without complications: Secondary | ICD-10-CM | POA: Diagnosis not present

## 2023-05-27 LAB — BASIC METABOLIC PANEL
Anion gap: 10 (ref 5–15)
BUN: 35 mg/dL — ABNORMAL HIGH (ref 6–20)
CO2: 28 mmol/L (ref 22–32)
Calcium: 8.7 mg/dL — ABNORMAL LOW (ref 8.9–10.3)
Chloride: 99 mmol/L (ref 98–111)
Creatinine, Ser: 1.31 mg/dL — ABNORMAL HIGH (ref 0.44–1.00)
GFR, Estimated: 49 mL/min — ABNORMAL LOW (ref >60)
Glucose, Bld: 170 mg/dL — ABNORMAL HIGH (ref 70–99)
Potassium: 4.2 mmol/L (ref 3.5–5.1)
Sodium: 137 mmol/L (ref 135–145)

## 2023-05-27 LAB — GLUCOSE, CAPILLARY
Glucose-Capillary: 160 mg/dL — ABNORMAL HIGH (ref 70–99)
Glucose-Capillary: 287 mg/dL — ABNORMAL HIGH (ref 70–99)

## 2023-05-27 MED ORDER — MOMETASONE FURO-FORMOTEROL FUM 200-5 MCG/ACT IN AERO
2.0000 | INHALATION_SPRAY | Freq: Two times a day (BID) | RESPIRATORY_TRACT | 0 refills | Status: DC
  Filled 2023-05-27: qty 13, 30d supply, fill #0

## 2023-05-27 MED ORDER — IPRATROPIUM-ALBUTEROL 0.5-2.5 (3) MG/3ML IN SOLN
3.0000 mL | Freq: Four times a day (QID) | RESPIRATORY_TRACT | 0 refills | Status: DC | PRN
  Filled 2023-05-27: qty 360, 30d supply, fill #0

## 2023-05-27 MED ORDER — LURASIDONE HCL 60 MG PO TABS
60.0000 mg | ORAL_TABLET | Freq: Every day | ORAL | 0 refills | Status: DC
  Filled 2023-05-27: qty 30, 30d supply, fill #0

## 2023-05-27 MED ORDER — MIDODRINE HCL 10 MG PO TABS
10.0000 mg | ORAL_TABLET | Freq: Three times a day (TID) | ORAL | 0 refills | Status: DC
  Filled 2023-05-27: qty 90, 30d supply, fill #0

## 2023-05-27 MED ORDER — TOUJEO SOLOSTAR 300 UNIT/ML ~~LOC~~ SOPN
10.0000 [IU] | PEN_INJECTOR | Freq: Every day | SUBCUTANEOUS | 0 refills | Status: DC
  Filled 2023-05-27: qty 3, 30d supply, fill #0

## 2023-05-27 MED ORDER — BUMETANIDE 1 MG PO TABS
1.0000 mg | ORAL_TABLET | Freq: Every day | ORAL | 0 refills | Status: DC
  Filled 2023-05-27: qty 30, 30d supply, fill #0

## 2023-05-27 MED ORDER — ROSUVASTATIN CALCIUM 20 MG PO TABS
20.0000 mg | ORAL_TABLET | Freq: Every day | ORAL | 0 refills | Status: DC
  Filled 2023-05-27: qty 30, 30d supply, fill #0

## 2023-05-27 MED ORDER — SERTRALINE HCL 100 MG PO TABS
100.0000 mg | ORAL_TABLET | Freq: Every day | ORAL | 0 refills | Status: DC
  Filled 2023-05-27: qty 30, 30d supply, fill #0

## 2023-05-27 MED ORDER — SPIRONOLACTONE 25 MG PO TABS
12.5000 mg | ORAL_TABLET | Freq: Every day | ORAL | 0 refills | Status: DC
  Filled 2023-05-27: qty 15, 30d supply, fill #0

## 2023-05-27 MED ORDER — SPIRONOLACTONE 12.5 MG HALF TABLET
12.5000 mg | ORAL_TABLET | Freq: Every day | ORAL | Status: DC
  Administered 2023-05-27: 12.5 mg via ORAL
  Filled 2023-05-27: qty 1

## 2023-05-27 NOTE — Progress Notes (Signed)
Pharmacist Heart Failure Core Measure Documentation  Assessment: [name] [name] has an EF documented as 20% by echo.  Rationale: Heart failure patients with left ventricular systolic dysfunction (LVSD) and an EF < 40% should be prescribed an angiotensin converting enzyme inhibitor (ACEI) or angiotensin receptor blocker (ARB) at discharge unless a contraindication is documented in the medical record.  This patient is not currently on an ACEI or ARB for HF.  This note is being placed in the record in order to provide documentation that a contraindication to the use of these agents is present for this encounter.  ACE Inhibitor or Angiotensin Receptor Blocker is contraindicated (specify all that apply)  []   ACEI allergy AND ARB allergy []   Angioedema []   Moderate or severe aortic stenosis []   Hyperkalemia [x]   Hypotension []   Renal artery stenosis []   Worsening renal function, preexisting renal disease or dysfunction   Leota Sauers Pharm.D. CPP, BCPS Clinical Pharmacist [phone] 05/27/2023 12:40 PM

## 2023-05-27 NOTE — Progress Notes (Signed)
Discharge teaching complete. Meds, diet, activity, follow up appointments reviewed and all questions answered. Copy of instructions given to patient and meds sent to [facility] pharmacy. Patient going to discharge lounge to wait on meds and taxi.

## 2023-05-27 NOTE — Progress Notes (Signed)
Mobility Specialist Progress Note:   05/27/23 1000  Mobility  Activity Ambulated with assistance in room  Level of Assistance Minimal assist, patient does 75% or more  Assistive Device None  Distance Ambulated (ft) 60 ft  Activity Response Tolerated well  Mobility Referral Yes  Mobility visit 1 Mobility  Mobility Specialist Start Time (ACUTE ONLY) 1000  Mobility Specialist Stop Time (ACUTE ONLY) 1010  Mobility Specialist Time Calculation (min) (ACUTE ONLY) 10 min   Pt agreeable to mobility session, requested to stay in room. MinA required d/t x1 LOB, otherwise no assistance required. SpO2 100% on RA throughout ambulation. Back in bed with all needs met.   Addison Lank Mobility Specialist Please contact via SecureChat or  Rehab office at [phone]

## 2023-05-27 NOTE — Discharge Summary (Addendum)
Physician Discharge Summary   Patient: [name] [name] MRN: [id number] DOB: February 26, 1971  Admit date:     05/22/2023  Discharge date: 05/27/23  Discharge Physician: Coralie Keens   PCP: [facility]   Recommendations at discharge:    Midodrine has been increased to 10 mg tid to prevent hypotension.  Added spironolactone for RAAS inhibition, further guideline medical directed therapy limited due to hypotension.  Continue diuresis with bumetanide.  Resume Lurasidone.  Will need close follow up as outpatient, [facility] center in 7 days . Follow up renal function and electrolytes in 7 days.  Follow up with Cardiology as scheduled.   Discharge Diagnoses: Active Problems:   Acute on chronic systolic CHF (congestive heart failure) (HCC)   Hypotension   CKD stage 3a, GFR 45-59 ml/min (HCC)   DM2 (diabetes mellitus, type 2) (HCC)   COPD (chronic obstructive pulmonary disease) (HCC)   Chronic pain syndrome  Resolved Problems:   * No resolved hospital problems. [facility] Course: [name] was admitted to the hospital with the working diagnosis of heart failure decompensation.   52 yo female with the past medical history of COPD, heart failure, CKD, T2DM, and hyperlipidemia who presented with dyspnea. Reported worsening dyspnea over the last 10 days. Positive intermittent chest pain with worse with deep breathing and better with rest. On 12/04 she had a ED evaluation where pulmonary embolism was ruled out, and was discharged home. She has been not adherent to her cardiovascular medications, not taking any since 09/24. On her initial physical examination her blood pressure was 138/93, HR 112, RR 29 and 02 saturation 93%, lungs with bilateral rales, with no wheezing, heart with S1 and S2 present and rhythmic, abdomen with no distention and positive lower extremity edema.   Na 143, K 3,7 CL 112, bicarbonate 19, glucose 396, bun 14 cr 0.95 Mg 1.6  BNP  2,978 High sensitive troponin 26 and 29  Wbc 7.4 hgb 10.5 plt 169   Chest radiograph with cardiomegaly, with bilateral hilar vascular congestion and small left pleural effusion.   12/14 patient with improvement in volume status, lactic acid is trending down.  12/15 responding to diuresis, not yet back to baseline.  12/16 improved volume status, plan to transition to oral loop diuretic tomorrow.   Assessment and Plan: Acute on chronic systolic CHF (congestive heart failure) (HCC) Echocardiogram (limited) with decreased LV systolic function with EF 20 to 25%, global hypokinesis, LV cavity dilatated, RV systolic function preserved, LA with dilatated, severe mitral regurgitation.   Patient was placed on IV furosemide for diuresis, negative fluid balance was achieved, with significant improvement in her symptoms.  At the time of her discharge she had lost 4.4 Kg.   Continue with midodrine for blood pressure support Patient will resume oral loop diuretic therapy with bumetanide and will add spironolactone.  Continue blood pressure support with midodrine.  Limited pharmacologic therapy due to hypotension.   Hypotension Continue with midodrine to prevent further hypotension.  CKD stage 3a, GFR 45-59 ml/min (HCC) AKI, hypokalemia. Hypomagnesemia.   At the time of her discharge her serum cr is 1,31 with K at 4,2 and serum bicarbonate at 28  Na 137 and Mg 2,6  Peak serum cr during her hospitalization was 1,43.   Plan to continue diuresis with bumetanide and will add spironolactone at the time of her discharge.  Follow up renal function and electrolytes in 7 days as outpatient.  Hold on K supplements for now.  DM2 (diabetes mellitus, type 2) (HCC) Uncontrolled hyperglycemia.   Patient was placed on insulin sliding scale for glucose cover and monitoring.  Continue with basal insulin to 10 units  Fasting glucose this am 170  mg/dl.   COPD (chronic obstructive pulmonary disease)  (HCC) No signs of acute exacerbation.  Continue bronchodilator therapy. LABA/ICS.  Continue with incentive spirometer and flutter valve.    Chronic pain syndrome Depression.   Patient had a recent hospitalization in behavioral health and she was placed on lurasidone 60 mg.  Will give a new prescription for this medications and she will need close follow up as outpatient.   She required analgesics oral oxycodone and for break through pain IV hydromorphone during her hospitalization.          Consultants: none  Procedures performed: none   Disposition: Home Diet recommendation:  Discharge Diet Orders (From admission, onward)     Start     Ordered   05/27/23 0000  Diet - low sodium heart healthy        05/27/23 1148           Cardiac and Carb modified diet DISCHARGE MEDICATION: Allergies as of 05/27/2023       Reactions   Tramadol Nausea Only, Rash   Wellbutrin [bupropion] Other (See Comments)   Behavioral changes (aggression, irritability)   Benadryl [diphenhydramine] Other (See Comments)   Decreases motor skills and increases anxiety   Lactose Intolerance (gi) Diarrhea   Nicotine Rash   Patient states she is allergic to the Nicotine patch and they cause her to break out in a rash.  She states she can smoke cigarettes.        Medication List     STOP taking these medications    ibuprofen 200 MG tablet Commonly known as: ADVIL   potassium chloride 10 MEQ tablet Commonly known as: KLOR-CON M   vitamin D3 25 MCG tablet Commonly known as: CHOLECALCIFEROL   zolpidem 10 MG tablet Commonly known as: AMBIEN       TAKE these medications    bumetanide 1 MG tablet Commonly known as: BUMEX Take 1 tablet (1 mg total) by mouth daily.   diphenhydramine-acetaminophen 25-500 MG Tabs tablet Commonly known as: TYLENOL PM Take 2 tablets by mouth at bedtime as needed (sleep).   Insulin Pen Needle 32G X 4 MM Misc Use As Directed   Insulin Pen Needle 32G X  4 MM Misc Use 3 (three) times daily.   ipratropium-albuterol 0.5-2.5 (3) MG/3ML Soln Commonly known as: DUONEB inhale 3 mLs by nebulization every 6 (six) hours as needed.   Lurasidone HCl 60 MG Tabs Take 1 tablet (60 mg total) by mouth daily with supper.   midodrine 10 MG tablet Commonly known as: PROAMATINE Take 1 tablet (10 mg total) by mouth 3 (three) times daily with meals. What changed:  medication strength how much to take   mometasone-formoterol 200-5 MCG/ACT Aero Commonly known as: DULERA Inhale 2 puffs into the lungs 2 (two) times daily.   OneTouch Delica Lancets 33G Misc Use 3 (three) times daily as directed to check blood sugar   OneTouch Delica Plus Lancing Misc Use 3 (three) times daily.   OneTouch Verio test strip Generic drug: glucose blood Use three times daily as directed to check blood sugar   oxyCODONE-acetaminophen 10-325 MG tablet Commonly known as: PERCOCET Take 1 tablet by mouth every 6 (six) hours as needed for pain.   rosuvastatin 20 MG tablet Commonly known as: CRESTOR  Take 1 tablet (20 mg total) by mouth daily.   sertraline 100 MG tablet Commonly known as: ZOLOFT Take 1 tablet (100 mg total) by mouth daily.   spironolactone 25 MG tablet Commonly known as: ALDACTONE Take 0.5 tablets (12.5 mg total) by mouth daily.   Toujeo SoloStar 300 UNIT/ML Solostar Pen Generic drug: insulin glargine (1 Unit Dial) Inject 10 Units into the skin at bedtime. What changed: how much to take   Ventolin HFA 108 (90 Base) MCG/ACT inhaler Generic drug: albuterol Inhale 2 puffs into the lungs every 6 (six) hours as needed for wheezing or shortness of breath.        Discharge Exam: Filed Weights   05/25/23 0516 05/26/23 0601 05/27/23 0405  Weight: 73 kg 71.2 kg 70.3 kg   BP (!) 89/77 (BP Location: Left Arm)   Pulse 80   Temp 98.3 F (36.8 C) (Oral)   Resp 16   Ht 5\' 5"  (1.651 m)   Wt 70.3 kg   LMP  (LMP Unknown) Comment: Pt is poor historian   SpO2 94%   BMI 25.79 kg/m   Patient with improvement in dyspnea and edema, no PND or orthopnea.   Neurology awake and alert ENT with no pallor Cardiovascular with S1 and S2 present and regular with no gallops, rubs or murmurs No JVD No lower extremity edema Respiratory with no rales or wheezing, no rhonchi Abdomen with no distention   Condition at discharge: stable  The results of significant diagnostics from this hospitalization (including imaging, microbiology, ancillary and laboratory) are listed below for reference.   Imaging Studies: ECHOCARDIOGRAM LIMITED Result Date: 05/25/2023    ECHOCARDIOGRAM LIMITED REPORT   Patient Name:   CHAITRA NESTER Date of Exam: 05/25/2023 Medical Rec #:  [id number]       Height:       65.0 in Accession #:    [id number]      Weight:       160.9 lb Date of Birth:  [birth year]       BSA:          1.804 m Patient Age:    52 years        BP:           94/63 mmHg Patient Gender: F               HR:           81 bpm. Exam Location:  Inpatient Procedure: Cardiac Doppler, Color Doppler and Limited Echo Indications:    dyspnea  History:        Patient has prior history of Echocardiogram examinations, most                 recent 02/07/2023. CHF, COPD and chronic kidney disease; Risk                 Factors:Diabetes and Current Smoker.  Sonographer:    Delcie Roch RDCS Referring Phys: [id number] Aeryn Medici DANIEL Starlyn Droge IMPRESSIONS  1. Limited Echo.  2. Left ventricular ejection fraction, by estimation, is 20 to 25%. The left ventricle has severely decreased function. The left ventricle demonstrates global hypokinesis. The left ventricular internal cavity size was dilated. Left ventricular diastolic  parameters are consistent with Grade III diastolic dysfunction (restrictive).  3. Right ventricular systolic function grossly normal. The right ventricular size is grossly normal. There is mildly elevated pulmonary artery systolic pressure.  4. Left atrial size was visually  dilated.  5. The mitral valve  is degenerative. Severe mitral valve regurgitation. No evidence of mitral stenosis.  6. The aortic valve is tricuspid. Aortic valve regurgitation is not visualized. No aortic stenosis is present.  7. The inferior vena cava is normal in size with <50% respiratory variability, suggesting right atrial pressure of 8 mmHg. Comparison(s): A prior study was performed on 02/07/2023. No change in LVEF, Grade 2 diastolic dysfunction is now Grade 3, moderate MR is now severe. FINDINGS  Left Ventricle: Left ventricular ejection fraction, by estimation, is 20 to 25%. The left ventricle has severely decreased function. The left ventricle demonstrates global hypokinesis. The left ventricular internal cavity size was dilated. There is no left ventricular hypertrophy. Left ventricular diastolic parameters are consistent with Grade III diastolic dysfunction (restrictive). Right Ventricle: The right ventricular size is grossly normal. Right ventricular systolic function grossly normal. There is mildly elevated pulmonary artery systolic pressure. The tricuspid regurgitant velocity is 2.71 m/s, and with an assumed right atrial pressure of 8 mmHg, the estimated right ventricular systolic pressure is 37.4 mmHg. Left Atrium: Left atrial size was visually dilated. Right Atrium: Right atrial size was normal in size. Pericardium: Trivial pericardial effusion is present. The pericardial effusion is anterior to the right ventricle. Mitral Valve: The mitral valve is degenerative in appearance. Severe mitral valve regurgitation, with centrally-directed jet. No evidence of mitral valve stenosis. Tricuspid Valve: The tricuspid valve is normal in structure. Tricuspid valve regurgitation is mild . No evidence of tricuspid stenosis. Aortic Valve: The aortic valve is tricuspid. Aortic valve regurgitation is not visualized. No aortic stenosis is present. Pulmonic Valve: The pulmonic valve was not well visualized. Aorta: The  aortic root and ascending aorta are structurally normal, with no evidence of dilitation. Venous: IVC assessment for right atrial pressure unable to be performed due to mechanical ventilation. The inferior vena cava is normal in size with less than 50% respiratory variability, suggesting right atrial pressure of 8 mmHg. IAS/Shunts: There is right bowing of the interatrial septum, suggestive of elevated left atrial pressure. Additional Comments: Spectral Doppler performed. Color Doppler performed.  LEFT VENTRICLE PLAX 2D LVIDd:         7.40 cm LVIDs:         6.50 cm LV PW:         1.00 cm LV IVS:        0.90 cm  IVC IVC diam: 2.00 cm LEFT ATRIUM         Index LA diam:    4.90 cm 2.72 cm/m  AORTIC VALVE LVOT Vmax:   65.30 cm/s LVOT Vmean:  43.500 cm/s LVOT VTI:    0.111 m  AORTA Ao Root diam: 3.10 cm Ao Asc diam:  3.00 cm MITRAL VALVE                  TRICUSPID VALVE MV Area (PHT): 7.16 cm       TR Peak grad:   29.4 mmHg MV Decel Time: 106 msec       TR Vmax:        271.00 cm/s MR Peak grad:    67.9 mmHg MR Mean grad:    46.0 mmHg    SHUNTS MR Vmax:         412.00 cm/s  Systemic VTI: 0.11 m MR Vmean:        323.0 cm/s MR PISA:         1.57 cm MR PISA Eff ROA: 15 mm MR PISA Radius:  0.50 cm MV E velocity: 111.00  cm/s MV A velocity: 54.10 cm/s MV E/A ratio:  2.05 Sunit Tolia Electronically signed by Tessa Lerner Signature Date/Time: 05/25/2023/6:16:19 PM    Final    DG Chest 2 View Result Date: 05/22/2023 CLINICAL DATA:  Chest pain and shortness of breath. EXAM: CHEST - 2 VIEW COMPARISON:  Chest radiographs 05/14/2023, 02/10/2023, 02/06/2023 FINDINGS: Cardiac silhouette is again markedly enlarged. Mediastinal contours are within normal limits. There is again cephalization of the pulmonary vasculature. No overt pulmonary edema. No pleural effusion pneumothorax. Moderate multilevel degenerative disc changes of the thoracic spine. IMPRESSION: Marked cardiomegaly. Stable pulmonary vascular congestion. No overt  pulmonary edema. Electronically Signed   By: Neita Garnet M.D.   On: 05/22/2023 16:15   CT Angio Chest PE W and/or Wo Contrast Result Date: 05/14/2023 CLINICAL DATA:  Shortness of breath EXAM: CT ANGIOGRAPHY CHEST WITH CONTRAST TECHNIQUE: Multidetector CT imaging of the chest was performed using the standard protocol during bolus administration of intravenous contrast. Multiplanar CT image reconstructions and MIPs were obtained to evaluate the vascular anatomy. RADIATION DOSE REDUCTION: This exam was performed according to the departmental dose-optimization program which includes automated exposure control, adjustment of the mA and/or kV according to patient size and/or use of iterative reconstruction technique. CONTRAST:  65mL OMNIPAQUE IOHEXOL 350 MG/ML SOLN COMPARISON:  Chest x-ray 05/14/2023, CT 12/14/2022 FINDINGS: Cardiovascular: Satisfactory opacification of the pulmonary arteries to the segmental level. No evidence of pulmonary embolism. Mild aortic atherosclerosis. No aneurysm. Cardiomegaly. No pericardial effusion Mediastinum/Nodes: Midline trachea. No suspicious thyroid mass. Subcentimeter mediastinal lymph nodes. Esophagus within normal limits Lungs/Pleura: No sizable pleural effusion. No consolidation or pneumothorax. Clustered nodularity within the right lung base. Upper Abdomen: No acute finding.  Punctate right kidney stone Musculoskeletal: No acute or suspicious osseous abnormality. Review of the MIP images confirms the above findings. IMPRESSION: 1. Negative for acute pulmonary embolus. 2. Cardiomegaly. 3. Clustered nodularity within the right lung base, suspect small airways infection/inflammation. 4. Aortic atherosclerosis. Aortic Atherosclerosis (ICD10-I70.0). Electronically Signed   By: Jasmine Pang M.D.   On: 05/14/2023 22:08   DG Chest 2 View Result Date: 05/14/2023 CLINICAL DATA:  Shortness of breath.  Chest pain EXAM: CHEST - 2 VIEW COMPARISON:  X-ray 02/10/2023 and older FINDINGS:  Enlarged cardiopericardial silhouette. No pneumothorax or edema no effusion. No consolidation. Degenerative changes seen of the spine on lateral view. Overlapping cardiac leads. IMPRESSION: Enlarged heart.  No acute cardiopulmonary disease. Electronically Signed   By: Karen Kays M.D.   On: 05/14/2023 17:29    Microbiology: Results for orders placed or performed during the hospital encounter of 05/14/23  Resp panel by RT-PCR (RSV, Flu A&B, Covid) Anterior Nasal Swab     Status: None   Collection Time: 05/14/23  5:36 PM   Specimen: Anterior Nasal Swab  Result Value Ref Range Status   SARS Coronavirus 2 by RT PCR NEGATIVE NEGATIVE Final   Influenza A by PCR NEGATIVE NEGATIVE Final   Influenza B by PCR NEGATIVE NEGATIVE Final    Comment: (NOTE) The Xpert Xpress SARS-CoV-2/FLU/RSV plus assay is intended as an aid in the diagnosis of influenza from Nasopharyngeal swab specimens and should not be used as a sole basis for treatment. Nasal washings and aspirates are unacceptable for Xpert Xpress SARS-CoV-2/FLU/RSV testing.  Fact Sheet for Patients: BloggerCourse.com  Fact Sheet for Healthcare Providers: SeriousBroker.it  This test is not yet approved or cleared by the [place] FDA and has been authorized for detection and/or diagnosis of SARS-CoV-2 by FDA under an Emergency Use  Authorization (EUA). This EUA will remain in effect (meaning this test can be used) for the duration of the COVID-19 declaration under Section 564(b)(1) of the Act, 21 U.S.C. section 360bbb-3(b)(1), unless the authorization is terminated or revoked.     Resp Syncytial Virus by PCR NEGATIVE NEGATIVE Final    Comment: (NOTE) Fact Sheet for Patients: BloggerCourse.com  Fact Sheet for Healthcare Providers: SeriousBroker.it  This test is not yet approved or cleared by the [place] FDA and has been  authorized for detection and/or diagnosis of SARS-CoV-2 by FDA under an Emergency Use Authorization (EUA). This EUA will remain in effect (meaning this test can be used) for the duration of the COVID-19 declaration under Section 564(b)(1) of the Act, 21 U.S.C. section 360bbb-3(b)(1), unless the authorization is terminated or revoked.  Performed at [facility], [address] [place], [place] [address]     Labs: CBC: Recent Labs  Lab 05/22/23 1458 05/22/23 2056  WBC 7.4 6.1  HGB 10.5* 10.6*  HCT 33.4* 33.3*  MCV 91.5 92.0  PLT 169 185   Basic Metabolic Panel: Recent Labs  Lab 05/22/23 1458 05/22/23 2056 05/23/23 0230 05/23/23 0809 05/24/23 0214 05/25/23 0235 05/26/23 0226 05/27/23 0232  NA 143  --   --  140 136 138 136 137  K 3.7  --   --  3.3* 4.0 3.3* 3.7 4.2  CL 112*  --   --  104 98 92* 96* 99  CO2 19*  --   --  27 29 33* 32 28  GLUCOSE 396*  --   --  155* 210* 180* 204* 170*  BUN 14  --   --  23* 29* 35* 31* 35*  CREATININE 0.95   < >  --  1.34* 1.43* 1.39* 1.19* 1.31*  CALCIUM 7.8*  --   --  8.0* 7.9* 8.2* 8.3* 8.7*  MG 1.6*  --  2.1  --  1.7 1.7 2.6*  --    < > = values in this interval not displayed.   Liver Function Tests: Recent Labs  Lab 05/22/23 1458  AST 26  ALT 32  ALKPHOS 105  BILITOT 1.0  PROT 5.5*  ALBUMIN 2.9*   CBG: Recent Labs  Lab 05/26/23 1048 05/26/23 1517 05/26/23 2217 05/27/23 0546 05/27/23 1053  GLUCAP 274* 121* 193* 160* 287*    Discharge time spent: greater than 30 minutes.  Signed: Coralie Keens, MD [facility] 05/27/2023

## 2023-05-27 NOTE — TOC Transition Note (Addendum)
Transition of Care [facility]) - Discharge Note   Patient Details  Name: [name] [name] MRN: [id number] Date of Birth: 1971-02-18  Transition of Care [facility]) CM/SW Contact:  Leone Haven, RN Phone Number: 05/27/2023, 12:28 PM   Clinical Narrative:    For dc today, patient states she will need cab voucher to go home today, she states she already has a neb machine at home.  NCM will ast her with cab voucher. Has waiver on file.  TOC to fill meds.     Barriers to Discharge: Continued Medical Work up   Patient Goals and CMS Choice Patient states their goals for this hospitalization and ongoing recovery are:: return home with friend   Choice offered to / list presented to : NA      Discharge Placement                       Discharge Plan and Services Additional resources added to the After Visit Summary for   In-house Referral: NA Discharge Planning Services: CM Consult Post Acute Care Choice: NA          DME Arranged: N/A DME Agency: NA       HH Arranged: NA          Social Drivers of Health (SDOH) Interventions SDOH Screenings   Food Insecurity: Food Insecurity Present (05/22/2023)  Housing: Low Risk  (05/22/2023)  Transportation Needs: No Transportation Needs (05/22/2023)  Utilities: Not At Risk (05/22/2023)  Alcohol Screen: Low Risk  (02/12/2023)  Depression (PHQ2-9): High Risk (09/30/2022)  Financial Resource Strain: High Risk (09/30/2022)  Social Connections: Unknown (10/19/2021)   Received from [facility], [facility]  Stress: No Stress Concern Present (01/07/2022)   Received from [facility], [facility]  Tobacco Use: High Risk (05/23/2023)     Readmission Risk Interventions    02/12/2023    5:00 PM 09/02/2022    2:41 PM 08/28/2022    3:52 PM  Readmission Risk Prevention Plan  Transportation Screening Complete Complete Complete  Medication Review Ocean[facility])  Complete Complete  PCP or Specialist appointment within 3-5 days of  discharge  Complete Complete  HRI or Home Care Consult  Complete Complete  SW Recovery Care/Counseling Consult  --   Palliative Care Screening  Not Applicable Not Applicable  Skilled Nursing Facility  Not Applicable Not Applicable

## 2023-06-16 ENCOUNTER — Telehealth (HOSPITAL_COMMUNITY): Payer: Self-pay

## 2023-06-16 NOTE — Telephone Encounter (Signed)
 Tried calling pt to confirm/remind patient of their appointment [facility] on [facility]/17/[facility]. However,  was unable to leave a voice message.( Tried calling both numbers in pt's chart)

## 2023-06-17 ENCOUNTER — Encounter (HOSPITAL_COMMUNITY): Payer: BLUE CROSS/BLUE SHIELD

## 2023-07-05 ENCOUNTER — Inpatient Hospital Stay (HOSPITAL_COMMUNITY)
Admission: EM | Admit: 2023-07-05 | Discharge: 2023-07-10 | DRG: 291 | Disposition: A | Discharge status: Home / Self Care | Payer: BLUE CROSS/BLUE SHIELD | Attending: Internal Medicine | Admitting: Internal Medicine

## 2023-07-05 ENCOUNTER — Encounter (HOSPITAL_COMMUNITY): Payer: Self-pay | Admitting: Emergency Medicine

## 2023-07-05 ENCOUNTER — Emergency Department (HOSPITAL_COMMUNITY): Payer: BLUE CROSS/BLUE SHIELD

## 2023-07-05 ENCOUNTER — Other Ambulatory Visit: Payer: Self-pay

## 2023-07-05 DIAGNOSIS — Z888 Allergy status to other drugs, medicaments and biological substances status: Secondary | ICD-10-CM

## 2023-07-05 DIAGNOSIS — I10 Essential (primary) hypertension: Secondary | ICD-10-CM | POA: Diagnosis present

## 2023-07-05 DIAGNOSIS — I509 Heart failure, unspecified: Secondary | ICD-10-CM

## 2023-07-05 DIAGNOSIS — Z1152 Encounter for screening for COVID-19: Secondary | ICD-10-CM | POA: Diagnosis not present

## 2023-07-05 DIAGNOSIS — I5043 Acute on chronic combined systolic (congestive) and diastolic (congestive) heart failure: Secondary | ICD-10-CM | POA: Diagnosis present

## 2023-07-05 DIAGNOSIS — N179 Acute kidney failure, unspecified: Secondary | ICD-10-CM | POA: Diagnosis present

## 2023-07-05 DIAGNOSIS — T380X5A Adverse effect of glucocorticoids and synthetic analogues, initial encounter: Secondary | ICD-10-CM | POA: Diagnosis present

## 2023-07-05 DIAGNOSIS — Z794 Long term (current) use of insulin: Secondary | ICD-10-CM

## 2023-07-05 DIAGNOSIS — N1832 Chronic kidney disease, stage 3b: Secondary | ICD-10-CM | POA: Diagnosis present

## 2023-07-05 DIAGNOSIS — E0590 Thyrotoxicosis, unspecified without thyrotoxic crisis or storm: Secondary | ICD-10-CM | POA: Diagnosis present

## 2023-07-05 DIAGNOSIS — E1165 Type 2 diabetes mellitus with hyperglycemia: Secondary | ICD-10-CM | POA: Diagnosis present

## 2023-07-05 DIAGNOSIS — J441 Chronic obstructive pulmonary disease with (acute) exacerbation: Secondary | ICD-10-CM | POA: Diagnosis present

## 2023-07-05 DIAGNOSIS — J449 Chronic obstructive pulmonary disease, unspecified: Secondary | ICD-10-CM | POA: Diagnosis present

## 2023-07-05 DIAGNOSIS — Z8249 Family history of ischemic heart disease and other diseases of the circulatory system: Secondary | ICD-10-CM

## 2023-07-05 DIAGNOSIS — F17210 Nicotine dependence, cigarettes, uncomplicated: Secondary | ICD-10-CM | POA: Diagnosis present

## 2023-07-05 DIAGNOSIS — E876 Hypokalemia: Secondary | ICD-10-CM | POA: Diagnosis present

## 2023-07-05 DIAGNOSIS — G894 Chronic pain syndrome: Secondary | ICD-10-CM | POA: Diagnosis present

## 2023-07-05 DIAGNOSIS — N1831 Chronic kidney disease, stage 3a: Secondary | ICD-10-CM | POA: Diagnosis present

## 2023-07-05 DIAGNOSIS — I159 Secondary hypertension, unspecified: Secondary | ICD-10-CM | POA: Diagnosis not present

## 2023-07-05 DIAGNOSIS — E1122 Type 2 diabetes mellitus with diabetic chronic kidney disease: Secondary | ICD-10-CM | POA: Diagnosis present

## 2023-07-05 DIAGNOSIS — J9601 Acute respiratory failure with hypoxia: Secondary | ICD-10-CM | POA: Diagnosis present

## 2023-07-05 DIAGNOSIS — F319 Bipolar disorder, unspecified: Secondary | ICD-10-CM | POA: Diagnosis present

## 2023-07-05 DIAGNOSIS — Z79899 Other long term (current) drug therapy: Secondary | ICD-10-CM

## 2023-07-05 DIAGNOSIS — Z7951 Long term (current) use of inhaled steroids: Secondary | ICD-10-CM

## 2023-07-05 DIAGNOSIS — I340 Nonrheumatic mitral (valve) insufficiency: Secondary | ICD-10-CM | POA: Diagnosis present

## 2023-07-05 DIAGNOSIS — F419 Anxiety disorder, unspecified: Secondary | ICD-10-CM | POA: Diagnosis present

## 2023-07-05 DIAGNOSIS — J45901 Unspecified asthma with (acute) exacerbation: Secondary | ICD-10-CM | POA: Diagnosis present

## 2023-07-05 DIAGNOSIS — Z818 Family history of other mental and behavioral disorders: Secondary | ICD-10-CM

## 2023-07-05 DIAGNOSIS — Z833 Family history of diabetes mellitus: Secondary | ICD-10-CM | POA: Diagnosis not present

## 2023-07-05 DIAGNOSIS — I130 Hypertensive heart and chronic kidney disease with heart failure and stage 1 through stage 4 chronic kidney disease, or unspecified chronic kidney disease: Secondary | ICD-10-CM | POA: Diagnosis present

## 2023-07-05 DIAGNOSIS — I5022 Chronic systolic (congestive) heart failure: Secondary | ICD-10-CM

## 2023-07-05 DIAGNOSIS — Z885 Allergy status to narcotic agent status: Secondary | ICD-10-CM

## 2023-07-05 DIAGNOSIS — J42 Unspecified chronic bronchitis: Secondary | ICD-10-CM | POA: Diagnosis not present

## 2023-07-05 DIAGNOSIS — Z9851 Tubal ligation status: Secondary | ICD-10-CM

## 2023-07-05 DIAGNOSIS — I5041 Acute combined systolic (congestive) and diastolic (congestive) heart failure: Principal | ICD-10-CM

## 2023-07-05 DIAGNOSIS — E119 Type 2 diabetes mellitus without complications: Secondary | ICD-10-CM | POA: Diagnosis not present

## 2023-07-05 DIAGNOSIS — I5023 Acute on chronic systolic (congestive) heart failure: Secondary | ICD-10-CM | POA: Diagnosis not present

## 2023-07-05 DIAGNOSIS — E739 Lactose intolerance, unspecified: Secondary | ICD-10-CM | POA: Diagnosis present

## 2023-07-05 LAB — CBC WITH DIFFERENTIAL/PLATELET
Abs Immature Granulocytes: 0.07 10*3/uL (ref 0.00–0.07)
Basophils Absolute: 0.1 10*3/uL (ref 0.0–0.1)
Basophils Relative: 1 %
Eosinophils Absolute: 0 10*3/uL (ref 0.0–0.5)
Eosinophils Relative: 0 %
HCT: 35.7 % — ABNORMAL LOW (ref 36.0–46.0)
Hemoglobin: 11.5 g/dL — ABNORMAL LOW (ref 12.0–15.0)
Immature Granulocytes: 1 %
Lymphocytes Relative: 11 %
Lymphs Abs: 0.8 10*3/uL (ref 0.7–4.0)
MCH: 28.8 pg (ref 26.0–34.0)
MCHC: 32.2 g/dL (ref 30.0–36.0)
MCV: 89.5 fL (ref 80.0–100.0)
Monocytes Absolute: 0.2 10*3/uL (ref 0.1–1.0)
Monocytes Relative: 3 %
Neutro Abs: 5.9 10*3/uL (ref 1.7–7.7)
Neutrophils Relative %: 84 %
Platelets: 183 10*3/uL (ref 150–400)
RBC: 3.99 MIL/uL (ref 3.87–5.11)
RDW: 15.3 % (ref 11.5–15.5)
WBC: 7.1 10*3/uL (ref 4.0–10.5)
nRBC: 1 % — ABNORMAL HIGH (ref 0.0–0.2)

## 2023-07-05 LAB — RESP PANEL BY RT-PCR (RSV, FLU A&B, COVID)  RVPGX2
Influenza A by PCR: NEGATIVE
Influenza B by PCR: NEGATIVE
Resp Syncytial Virus by PCR: NEGATIVE
SARS Coronavirus 2 by RT PCR: NEGATIVE

## 2023-07-05 LAB — BASIC METABOLIC PANEL
Anion gap: 17 — ABNORMAL HIGH (ref 5–15)
BUN: 22 mg/dL — ABNORMAL HIGH (ref 6–20)
CO2: 23 mmol/L (ref 22–32)
Calcium: 8.7 mg/dL — ABNORMAL LOW (ref 8.9–10.3)
Chloride: 98 mmol/L (ref 98–111)
Creatinine, Ser: 1.32 mg/dL — ABNORMAL HIGH (ref 0.44–1.00)
GFR, Estimated: 48 mL/min — ABNORMAL LOW (ref >60)
Glucose, Bld: 494 mg/dL — ABNORMAL HIGH (ref 70–99)
Potassium: 3.7 mmol/L (ref 3.5–5.1)
Sodium: 138 mmol/L (ref 135–145)

## 2023-07-05 LAB — MAGNESIUM: Magnesium: 1.7 mg/dL (ref 1.7–2.4)

## 2023-07-05 LAB — GLUCOSE, CAPILLARY: Glucose-Capillary: 375 mg/dL — ABNORMAL HIGH (ref 70–99)

## 2023-07-05 LAB — BRAIN NATRIURETIC PEPTIDE: B Natriuretic Peptide: 3720.3 pg/mL — ABNORMAL HIGH (ref 0.0–100.0)

## 2023-07-05 MED ORDER — INSULIN ASPART 100 UNIT/ML IJ SOLN
0.0000 [IU] | Freq: Three times a day (TID) | INTRAMUSCULAR | Status: DC
  Administered 2023-07-06 (×3): 5 [IU] via SUBCUTANEOUS
  Administered 2023-07-07: 11 [IU] via SUBCUTANEOUS
  Administered 2023-07-07: 15 [IU] via SUBCUTANEOUS
  Administered 2023-07-07: 11 [IU] via SUBCUTANEOUS
  Administered 2023-07-08: 8 [IU] via SUBCUTANEOUS
  Administered 2023-07-08 (×2): 15 [IU] via SUBCUTANEOUS
  Administered 2023-07-09: 11 [IU] via SUBCUTANEOUS
  Administered 2023-07-09: 5 [IU] via SUBCUTANEOUS
  Administered 2023-07-09 – 2023-07-10 (×2): 11 [IU] via SUBCUTANEOUS
  Administered 2023-07-10: 3 [IU] via SUBCUTANEOUS

## 2023-07-05 MED ORDER — MORPHINE SULFATE (PF) 2 MG/ML IV SOLN
2.0000 mg | Freq: Once | INTRAVENOUS | Status: AC
  Administered 2023-07-05: 2 mg via INTRAVENOUS
  Filled 2023-07-05: qty 1

## 2023-07-05 MED ORDER — SERTRALINE HCL 100 MG PO TABS
100.0000 mg | ORAL_TABLET | Freq: Every day | ORAL | Status: DC
  Administered 2023-07-06 – 2023-07-10 (×5): 100 mg via ORAL
  Filled 2023-07-05 (×5): qty 1

## 2023-07-05 MED ORDER — SPIRONOLACTONE 12.5 MG HALF TABLET
12.5000 mg | ORAL_TABLET | Freq: Every day | ORAL | Status: DC
  Administered 2023-07-06 – 2023-07-10 (×5): 12.5 mg via ORAL
  Filled 2023-07-05 (×5): qty 1

## 2023-07-05 MED ORDER — ONDANSETRON HCL 4 MG/2ML IJ SOLN
4.0000 mg | Freq: Once | INTRAMUSCULAR | Status: AC
  Administered 2023-07-05: 4 mg via INTRAVENOUS
  Filled 2023-07-05: qty 2

## 2023-07-05 MED ORDER — FUROSEMIDE 10 MG/ML IJ SOLN
40.0000 mg | Freq: Once | INTRAMUSCULAR | Status: AC
  Administered 2023-07-05: 40 mg via INTRAVENOUS
  Filled 2023-07-05: qty 4

## 2023-07-05 MED ORDER — ACETAMINOPHEN 325 MG PO TABS
650.0000 mg | ORAL_TABLET | ORAL | Status: DC | PRN
  Administered 2023-07-06 – 2023-07-10 (×3): 650 mg via ORAL
  Filled 2023-07-05 (×3): qty 2

## 2023-07-05 MED ORDER — SODIUM CHLORIDE 0.9% FLUSH
3.0000 mL | Freq: Two times a day (BID) | INTRAVENOUS | Status: DC
  Administered 2023-07-05 – 2023-07-10 (×9): 3 mL via INTRAVENOUS

## 2023-07-05 MED ORDER — INSULIN ASPART 100 UNIT/ML IJ SOLN
0.0000 [IU] | Freq: Every day | INTRAMUSCULAR | Status: DC
  Administered 2023-07-05: 5 [IU] via SUBCUTANEOUS
  Administered 2023-07-06 – 2023-07-08 (×3): 3 [IU] via SUBCUTANEOUS
  Administered 2023-07-09: 4 [IU] via SUBCUTANEOUS

## 2023-07-05 MED ORDER — MIDODRINE HCL 5 MG PO TABS
10.0000 mg | ORAL_TABLET | Freq: Three times a day (TID) | ORAL | Status: DC
  Administered 2023-07-06 – 2023-07-09 (×11): 10 mg via ORAL
  Filled 2023-07-05 (×11): qty 2

## 2023-07-05 MED ORDER — FUROSEMIDE 10 MG/ML IJ SOLN
40.0000 mg | Freq: Two times a day (BID) | INTRAMUSCULAR | Status: DC
  Administered 2023-07-06: 40 mg via INTRAVENOUS
  Filled 2023-07-05: qty 4

## 2023-07-05 MED ORDER — MORPHINE SULFATE (PF) 4 MG/ML IV SOLN
6.0000 mg | Freq: Once | INTRAVENOUS | Status: AC
  Administered 2023-07-05: 6 mg via INTRAVENOUS
  Filled 2023-07-05: qty 2

## 2023-07-05 MED ORDER — SODIUM CHLORIDE 0.9 % IV SOLN: 250.0000 mL | INTRAVENOUS | Status: AC | PRN

## 2023-07-05 MED ORDER — SODIUM CHLORIDE 0.9% FLUSH: 3.0000 mL | INTRAVENOUS | Status: DC | PRN

## 2023-07-05 MED ORDER — IPRATROPIUM-ALBUTEROL 0.5-2.5 (3) MG/3ML IN SOLN: 3.0000 mL | Freq: Four times a day (QID) | RESPIRATORY_TRACT | Status: DC | PRN

## 2023-07-05 MED ORDER — ENOXAPARIN SODIUM 40 MG/0.4ML IJ SOSY
40.0000 mg | PREFILLED_SYRINGE | INTRAMUSCULAR | Status: DC
  Administered 2023-07-06 – 2023-07-08 (×2): 40 mg via SUBCUTANEOUS
  Filled 2023-07-05 (×4): qty 0.4

## 2023-07-05 NOTE — ED Provider Notes (Signed)
[place] EMERGENCY DEPARTMENT AT [facility] Provider Note   CSN: [id number] Arrival date & time: 07/05/23  1559     History {Add pertinent medical, surgical, social history, OB history to HPI:1} Chief Complaint  Patient presents with   Chest Pain    [name] [name] is a 53 y.o. female.  HPI     53 year old female comes in with chief complaint of chest pain and shortness of breath.  Patient has past medical history of COPD, CHF, CKD, diabetes.   Patient states that she has been having slightly worsening cough, shortness of breath over the last week.  Her shortness of breath is present just with coughing now.  At baseline, she is not very active because of her chronic heart and lung disease.  However, now she is unable to do any ADLs.  Patient's cough is producing or ingestion phlegm.  She denies any fevers, chills.  She has had some wheezing, but taking her medications as prescribed.  Review of system is positive for orthopnea, paroxysmal nocturnal dyspnea.  Home Medications Prior to Admission medications   Medication Sig Start Date End Date Taking? Authorizing Provider  albuterol (VENTOLIN HFA) 108 (90 Base) MCG/ACT inhaler Inhale 2 puffs into the lungs every 6 (six) hours as needed for wheezing or shortness of breath. 09/27/22   Arrien, York Ram, MD  bumetanide (BUMEX) 1 MG tablet Take 1 tablet (1 mg total) by mouth daily. 05/27/23 06/26/23  Arrien, York Ram, MD  diphenhydramine-acetaminophen (TYLENOL PM) 25-500 MG TABS tablet Take 2 tablets by mouth at bedtime as needed (sleep).    [provider]  glucose blood (ONETOUCH VERIO) test strip Use three times daily as directed to check blood sugar 02/11/23   Alexander-Savino, [place], MD  insulin glargine, 1 Unit Dial, (TOUJEO SOLOSTAR) 300 UNIT/ML Solostar Pen Inject 10 Units into the skin at bedtime. 05/27/23 06/26/23  Arrien, York Ram, MD  Insulin Pen Needle 32G X 4 MM MISC Use As Directed  09/30/22   Arrien, York Ram, MD  Insulin Pen Needle 32G X 4 MM MISC Use 3 (three) times daily. 02/11/23   Alexander-Savino, [place], MD  ipratropium-albuterol (DUONEB) 0.5-2.5 (3) MG/3ML SOLN inhale 3 mLs by nebulization every 6 (six) hours as needed. 05/27/23   Arrien, York Ram, MD  Lancet Devices [facility] DELICA PLUS LANCING) MISC Use 3 (three) times daily. 02/11/23   Alexander-Savino, [place], MD  Lurasidone HCl 60 MG TABS Take 1 tablet (60 mg total) by mouth daily with supper. 05/27/23 06/26/23  Arrien, York Ram, MD  midodrine (PROAMATINE) 10 MG tablet Take 1 tablet (10 mg total) by mouth 3 (three) times daily with meals. 05/27/23   Arrien, York Ram, MD  mometasone-formoterol [facility]) 200-5 MCG/ACT AERO Inhale 2 puffs into the lungs 2 (two) times daily. 05/27/23 06/26/23  Arrien, York Ram, MD  OneTouch Delica Lancets 33G MISC Use 3 (three) times daily as directed to check blood sugar 02/11/23   Alexander-Savino, [place], MD  oxyCODONE-acetaminophen (PERCOCET) 10-325 MG tablet Take 1 tablet by mouth every 6 (six) hours as needed for pain. 09/02/22   Wouk, Wilfred Curtis, MD  rosuvastatin (CRESTOR) 20 MG tablet Take 1 tablet (20 mg total) by mouth daily. 05/27/23 06/26/23  Arrien, York Ram, MD  sertraline (ZOLOFT) 100 MG tablet Take 1 tablet (100 mg total) by mouth daily. 05/27/23   Arrien, York Ram, MD  spironolactone (ALDACTONE) 25 MG tablet Take 0.5 tablets (12.5 mg total) by mouth daily. 05/27/23   Arrien, Mauricio  Reuel Boom, MD  benztropine (COGENTIN) 1 MG tablet Take 1 tablet (1 mg total) by mouth 2 (two) times daily. 12/23/18 06/04/20  Malvin Johns, MD  insulin aspart (NOVOLOG) 100 UNIT/ML injection Inject 0-9 Units into the skin 3 (three) times daily with meals. Patient not taking: Reported on 06/03/2020 12/16/18 06/04/20  Lanae Boast, MD      Allergies    Tramadol, Wellbutrin [bupropion], Benadryl [diphenhydramine], Lactose intolerance (gi), and Nicotine     Review of Systems   Review of Systems  All other systems reviewed and are negative.   Physical Exam Updated Vital Signs BP 110/83   Pulse (!) 110   Temp 98.1 F (36.7 C) (Oral)   Resp (!) 28   Wt 68.9 kg   LMP  (LMP Unknown) Comment: Pt is poor historian  SpO2 94%   BMI 25.29 kg/m  Physical Exam Vitals and nursing note reviewed.  Constitutional:      Appearance: She is well-developed.  HENT:     Head: Atraumatic.  Cardiovascular:     Rate and Rhythm: Normal rate.  Pulmonary:     Effort: Pulmonary effort is normal.     Breath sounds: Examination of the right-upper field reveals wheezing. Examination of the left-upper field reveals wheezing. Examination of the right-middle field reveals wheezing. Examination of the left-middle field reveals wheezing. Examination of the right-lower field reveals wheezing. Examination of the left-lower field reveals wheezing. Wheezing present. No rales.  Musculoskeletal:     Cervical back: Normal range of motion and neck supple.     Right lower leg: No tenderness. No edema.     Left lower leg: No tenderness. No edema.  Skin:    General: Skin is warm and dry.  Neurological:     Mental Status: She is alert and oriented to person, place, and time.     ED Results / Procedures / Treatments   Labs (all labs ordered are listed, but only abnormal results are displayed) Labs Reviewed  BASIC METABOLIC PANEL - Abnormal; Notable for the following components:      Result Value   Glucose, Bld 494 (*)    BUN 22 (*)    Creatinine, Ser 1.32 (*)    Calcium 8.7 (*)    GFR, Estimated 48 (*)    Anion gap 17 (*)    All other components within normal limits  BRAIN NATRIURETIC PEPTIDE - Abnormal; Notable for the following components:   B Natriuretic Peptide 3,720.3 (*)    All other components within normal limits  CBC WITH DIFFERENTIAL/PLATELET - Abnormal; Notable for the following components:   Hemoglobin 11.5 (*)    HCT 35.7 (*)    nRBC 1.0 (*)     All other components within normal limits  RESP PANEL BY RT-PCR (RSV, FLU A&B, COVID)  RVPGX2  MAGNESIUM    EKG EKG Interpretation Date/Time:  Saturday July 05 2023 16:21:12 EST Ventricular Rate:  105 PR Interval:  167 QRS Duration:  113 QT Interval:  386 QTC Calculation: 511 R Axis:   119  Text Interpretation: Sinus tachycardia Probable left atrial enlargement Abnormal R-wave progression, late transition Consider left ventricular hypertrophy Prolonged QT interval No significant change since last tracing Confirmed by Derwood Kaplan [id number]) on 07/05/2023 5:18:55 PM  Radiology No results found.  Procedures Procedures  {Document cardiac monitor, telemetry assessment procedure when appropriate:1}  Medications Ordered in ED Medications  morphine (PF) 4 MG/ML injection 6 mg (has no administration in time range)  furosemide (LASIX) injection 40  mg (has no administration in time range)    ED Course/ Medical Decision Making/ A&P   {   Click here for ABCD2, HEART and other calculatorsREFRESH Note before signing :1}                              Medical Decision Making Amount and/or Complexity of Data Reviewed Labs: ordered. Radiology: ordered.  Risk Prescription drug management. Decision regarding hospitalization.   This patient presents to the ED with chief complaint(s) of shortness of breath, cough with pertinent past medical history of COPD, CAD, CHF.The complaint involves an extensive differential diagnosis and also carries with it a high risk of complications and morbidity.    The differential diagnosis includes : Acute COPD exacerbation, acute CHF exacerbation, pulmonary edema, pneumothorax, acute coronary syndrome, severe anemia, viral illness, pneumonia  The initial plan is to get basic labs, chest x-ray.   Additional history obtained: Records reviewed previous admission documents.  Patient was admitted within the last 3 months for diagnosis of CHF.  She also  had a CT PE done at that time which was negative for blood clots.  Independent labs interpretation:  The following labs were independently interpreted: ***  Independent visualization and interpretation of imaging: - I independently visualized the following imaging with scope of interpretation limited to determining acute life threatening conditions related to emergency care: ***, which revealed ***  Treatment and Reassessment: ***  Consultation: - Consulted or discussed management/test interpretation with external professional: ***  Consideration for admission or further workup:  Social Determinants of health:   Final Clinical Impression(s) / ED Diagnoses Final diagnoses:  Acute combined systolic and diastolic congestive heart failure (HCC)    Rx / DC Orders ED Discharge Orders     None

## 2023-07-05 NOTE — ED Triage Notes (Addendum)
Pt presents via EMS for cough and SOB x 1 week. Endorses C, urinary frequency. Denies fevers, chills, abd pain EMS exam: lungs clear, increased work of breathing, dyspnea, tachypnea, provided 2L for  EMS: 95% RA, 98% on 2L, EKG unremarkable, afebrile, 110/70, 95bpm  H/o CHF, DM, COPD, asthma

## 2023-07-05 NOTE — H&P (Signed)
History and Physical    Patient: [name] [name] ZOX:[id number] DOB: 1970/08/11 DOA: 07/05/2023 DOS: the patient was seen and examined on 07/05/2023 PCP: [facility]  Patient coming from: Home  Chief Complaint:  Chief Complaint  Patient presents with   Chest Pain   HPI: [name] [name] is a 53 y.o. female with medical history significant of COPD, DM2, Bipolar Disorder, Depression, Anxiety Disorder, Asthma, Systolic Dysfunction CHF with EF 20-25% here with shortness of breath, Chest pain, cough and wheezing.  Symptoms have been ongoing since last week. Cough is productive and she is wheezing. Patient was found to have oxygen saturation of 88% on room air.  Patient equally has PND and orthopnea.  She has some lower extremity edema.  She appears to have acute exam vision of her CHF.  Her last echocardiogram was in December showing EF of 20 to 25%.  Chest x-ray showed moderate cardiomegaly with no acute process.  Review of Systems: As mentioned in the history of present illness. All other systems reviewed and are negative. Past Medical History:  Diagnosis Date   Anxiety disorder    Asthma    Bipolar affective disorder (HCC)    Blood transfusion without reported diagnosis    CHF (congestive heart failure) (HCC)    COPD (chronic obstructive pulmonary disease) (HCC)    Depression    Diabetes mellitus    Migraine    Past Surgical History:  Procedure Laterality Date   KNEE ARTHROSCOPY     x 2   KNEE SURGERY Left    Incision made and knee cleaned out   TUBAL LIGATION     Social History:  reports that she has been smoking cigarettes. She has a 29 pack-year smoking history. She has never used smokeless tobacco. She reports that she does not currently use alcohol. She reports that she does not use drugs.  Allergies  Allergen Reactions   Tramadol Nausea Only and Rash   Wellbutrin [Bupropion] Other (See Comments)    Behavioral changes (aggression, irritability)   Benadryl  [Diphenhydramine] Other (See Comments)    Decreases motor skills and increases anxiety   Lactose Intolerance (Gi) Diarrhea   Nicotine Rash    Patient states she is allergic to the Nicotine patch and they cause her to break out in a rash.  She states she can smoke cigarettes.    Family History  Problem Relation Age of Onset   Bipolar disorder Mother    Hypertension Father    Diabetes Father     Prior to Admission medications   Medication Sig Start Date End Date Taking? Authorizing Provider  albuterol (VENTOLIN HFA) 108 (90 Base) MCG/ACT inhaler Inhale 2 puffs into the lungs every 6 (six) hours as needed for wheezing or shortness of breath. 09/27/22   Arrien, York Ram, MD  bumetanide (BUMEX) 1 MG tablet Take 1 tablet (1 mg total) by mouth daily. 05/27/23 06/26/23  Arrien, York Ram, MD  diphenhydramine-acetaminophen (TYLENOL PM) 25-500 MG TABS tablet Take 2 tablets by mouth at bedtime as needed (sleep).    [provider]  glucose blood (ONETOUCH VERIO) test strip Use three times daily as directed to check blood sugar 02/11/23   Alexander-Savino, [place], MD  insulin glargine, 1 Unit Dial, (TOUJEO SOLOSTAR) 300 UNIT/ML Solostar Pen Inject 10 Units into the skin at bedtime. 05/27/23 06/26/23  Arrien, York Ram, MD  Insulin Pen Needle 32G X 4 MM MISC Use As Directed 09/30/22   Arrien, York Ram, MD  Insulin  Pen Needle 32G X 4 MM MISC Use 3 (three) times daily. 02/11/23   Alexander-Savino, [place], MD  ipratropium-albuterol (DUONEB) 0.5-2.5 (3) MG/3ML SOLN inhale 3 mLs by nebulization every 6 (six) hours as needed. 05/27/23   Arrien, York Ram, MD  Lancet Devices [facility] DELICA PLUS LANCING) MISC Use 3 (three) times daily. 02/11/23   Alexander-Savino, [place], MD  Lurasidone HCl 60 MG TABS Take 1 tablet (60 mg total) by mouth daily with supper. 05/27/23 06/26/23  Arrien, York Ram, MD  midodrine (PROAMATINE) 10 MG tablet Take 1 tablet (10 mg total) by  mouth 3 (three) times daily with meals. 05/27/23   Arrien, York Ram, MD  mometasone-formoterol [facility]) 200-5 MCG/ACT AERO Inhale 2 puffs into the lungs 2 (two) times daily. 05/27/23 06/26/23  Arrien, York Ram, MD  OneTouch Delica Lancets 33G MISC Use 3 (three) times daily as directed to check blood sugar 02/11/23   Alexander-Savino, [place], MD  oxyCODONE-acetaminophen (PERCOCET) 10-325 MG tablet Take 1 tablet by mouth every 6 (six) hours as needed for pain. 09/02/22   Wouk, Wilfred Curtis, MD  rosuvastatin (CRESTOR) 20 MG tablet Take 1 tablet (20 mg total) by mouth daily. 05/27/23 06/26/23  Arrien, York Ram, MD  sertraline (ZOLOFT) 100 MG tablet Take 1 tablet (100 mg total) by mouth daily. 05/27/23   Arrien, York Ram, MD  spironolactone (ALDACTONE) 25 MG tablet Take 0.5 tablets (12.5 mg total) by mouth daily. 05/27/23   Arrien, York Ram, MD  benztropine (COGENTIN) 1 MG tablet Take 1 tablet (1 mg total) by mouth 2 (two) times daily. 12/23/18 06/04/20  Malvin Johns, MD  insulin aspart (NOVOLOG) 100 UNIT/ML injection Inject 0-9 Units into the skin 3 (three) times daily with meals. Patient not taking: Reported on 06/03/2020 12/16/18 06/04/20  Lanae Boast, MD    Physical Exam: Vitals:   07/05/23 1803 07/05/23 1804 07/05/23 1815 07/05/23 1830  BP:   126/88 124/85  Pulse:    99  Resp:   (!) 28 (!) 27  Temp:      TempSrc:      SpO2:  96% 96% 91%  Weight: 68 kg     Height: 5\' 5"  (1.651 m)      Constitutional: Acutely ill looking, NAD, calm, comfortable Eyes: PERRL, lids and conjunctivae normal ENMT: Mucous membranes are moist. Posterior pharynx clear of any exudate or lesions.Normal dentition.  Neck: normal, supple, no masses, no thyromegaly Respiratory: Decreased AE Bilaterally with crackles,  Normal respiratory effort. No accessory muscle use.  Cardiovascular: Regular rate and rhythm, no murmurs / rubs / gallops. No extremity edema. 2+ pedal pulses. No carotid bruits.   Abdomen: no tenderness, no masses palpated. No hepatosplenomegaly. Bowel sounds positive.  Musculoskeletal: Good range of motion, no joint swelling or tenderness, Skin: no rashes, lesions, ulcers. No induration Neurologic: CN 2-12 grossly intact. Sensation intact, DTR normal. Strength 5/5 in all 4.  Psychiatric: Normal judgment and insight. Alert and oriented x 3. Normal mood  Data Reviewed:  Chest x-ray showing moderate cardiomegaly no acute cardiopulmonary process, blood sugar of 194, creatinine 1.32, BNP 3720, anion gap of 17 and hemoglobin 11.5.  Acute viral screen is negative.  Assessment and Plan:  #1 acute hypoxic respiratory failure: Suspected due to CHF exacerbation and COPD exacerbation.  Patient will be admitted.  Initiate diuresis.  Continue with breathing treatments.  Will likely consider steroids.  Continue other cardiac medications.  Follow oxygen closely.  #2 systolic dysfunction CHF: Patient's EF was 25%.  Has recently had echocardiogram  in December so no repeat.  Will monitor.  #3 type II diabetes: Uncontrolled.  Blood sugars almost 500.  He has a gap of 17.  Bicarb is normal.  I do not think this is acidosis.  Will continue to monitor.  #4 essential hypertension: Blood pressure is controlled.  Continue monitoring.  Continue home regimen.  #5 chronic kidney disease stage IIIb: Renal function seems to be at baseline.  Continue to monitor  #6 COPD: Possible acute exacerbation.  Continue treatment as above.  #7 chronic pain syndrome: Continue chronic home regimen  #8 hyperthyroidism: Continue home regimen as well.    Advance Care Planning:   Code Status: Full Code   Consults: None  Family Communication: No family at bedside  Severity of Illness: The appropriate patient status for this patient is INPATIENT. Inpatient status is judged to be reasonable and necessary in order to provide the required intensity of service to ensure the patient's safety. The patient's  presenting symptoms, physical exam findings, and initial radiographic and laboratory data in the context of their chronic comorbidities is felt to place them at high risk for further clinical deterioration. Furthermore, it is not anticipated that the patient will be medically stable for discharge from the hospital within 2 midnights of admission.   * I certify that at the point of admission it is my clinical judgment that the patient will require inpatient hospital care spanning beyond 2 midnights from the point of admission due to high intensity of service, high risk for further deterioration and high frequency of surveillance required.*  AuthorLonia Blood, MD 07/05/2023 7:10 PM  For on call review [email or web address].

## 2023-07-05 NOTE — ED Notes (Addendum)
Pt O2 decreased to 88% on RA after receiving 6mg  morphine (see MAR), improved to 94% on 3L. At this time, pt informs staff that she has been previously told to wear O2 continuously but does only PRN at home d/t energy bill cost to running concentrator continuously.

## 2023-07-06 DIAGNOSIS — J42 Unspecified chronic bronchitis: Secondary | ICD-10-CM

## 2023-07-06 DIAGNOSIS — N1831 Chronic kidney disease, stage 3a: Secondary | ICD-10-CM | POA: Diagnosis not present

## 2023-07-06 DIAGNOSIS — Z794 Long term (current) use of insulin: Secondary | ICD-10-CM

## 2023-07-06 DIAGNOSIS — I5023 Acute on chronic systolic (congestive) heart failure: Secondary | ICD-10-CM | POA: Diagnosis not present

## 2023-07-06 DIAGNOSIS — I159 Secondary hypertension, unspecified: Secondary | ICD-10-CM | POA: Diagnosis not present

## 2023-07-06 DIAGNOSIS — G894 Chronic pain syndrome: Secondary | ICD-10-CM

## 2023-07-06 DIAGNOSIS — E119 Type 2 diabetes mellitus without complications: Secondary | ICD-10-CM | POA: Diagnosis not present

## 2023-07-06 DIAGNOSIS — F319 Bipolar disorder, unspecified: Secondary | ICD-10-CM | POA: Insufficient documentation

## 2023-07-06 LAB — GLUCOSE, CAPILLARY
Glucose-Capillary: 205 mg/dL — ABNORMAL HIGH (ref 70–99)
Glucose-Capillary: 244 mg/dL — ABNORMAL HIGH (ref 70–99)
Glucose-Capillary: 203 mg/dL — ABNORMAL HIGH (ref 70–99)
Glucose-Capillary: 299 mg/dL — ABNORMAL HIGH (ref 70–99)

## 2023-07-06 LAB — MRSA NEXT GEN BY PCR, NASAL: MRSA by PCR Next Gen: NOT DETECTED

## 2023-07-06 LAB — CBC
HCT: 33.6 % — ABNORMAL LOW (ref 36.0–46.0)
Hemoglobin: 10.9 g/dL — ABNORMAL LOW (ref 12.0–15.0)
MCH: 29 pg (ref 26.0–34.0)
MCHC: 32.4 g/dL (ref 30.0–36.0)
MCV: 89.4 fL (ref 80.0–100.0)
Platelets: 181 10*3/uL (ref 150–400)
RBC: 3.76 MIL/uL — ABNORMAL LOW (ref 3.87–5.11)
RDW: 15.3 % (ref 11.5–15.5)
WBC: 6.3 10*3/uL (ref 4.0–10.5)
nRBC: 1.1 % — ABNORMAL HIGH (ref 0.0–0.2)

## 2023-07-06 LAB — CREATININE, SERUM
Creatinine, Ser: 1.31 mg/dL — ABNORMAL HIGH (ref 0.44–1.00)
GFR, Estimated: 48 mL/min — ABNORMAL LOW (ref >60)

## 2023-07-06 LAB — BASIC METABOLIC PANEL
Anion gap: 10 (ref 5–15)
BUN: 28 mg/dL — ABNORMAL HIGH (ref 6–20)
CO2: 29 mmol/L (ref 22–32)
Calcium: 8.1 mg/dL — ABNORMAL LOW (ref 8.9–10.3)
Chloride: 98 mmol/L (ref 98–111)
Creatinine, Ser: 1.4 mg/dL — ABNORMAL HIGH (ref 0.44–1.00)
GFR, Estimated: 45 mL/min — ABNORMAL LOW (ref >60)
Glucose, Bld: 287 mg/dL — ABNORMAL HIGH (ref 70–99)
Potassium: 3.4 mmol/L — ABNORMAL LOW (ref 3.5–5.1)
Sodium: 137 mmol/L (ref 135–145)

## 2023-07-06 MED ORDER — MAGNESIUM SULFATE 4 GM/100ML IV SOLN
4.0000 g | Freq: Once | INTRAVENOUS | Status: DC
  Filled 2023-07-06: qty 100

## 2023-07-06 MED ORDER — FUROSEMIDE 10 MG/ML IJ SOLN: 60.0000 mg | Freq: Two times a day (BID) | INTRAMUSCULAR | Status: DC

## 2023-07-06 MED ORDER — METHYLPREDNISOLONE SODIUM SUCC 40 MG IJ SOLR
40.0000 mg | Freq: Two times a day (BID) | INTRAMUSCULAR | Status: DC
  Administered 2023-07-06 – 2023-07-07 (×4): 40 mg via INTRAVENOUS
  Filled 2023-07-06 (×4): qty 1

## 2023-07-06 MED ORDER — POTASSIUM CHLORIDE CRYS ER 20 MEQ PO TBCR
40.0000 meq | EXTENDED_RELEASE_TABLET | ORAL | Status: AC
  Administered 2023-07-06 (×2): 40 meq via ORAL
  Filled 2023-07-06 (×2): qty 2

## 2023-07-06 MED ORDER — IPRATROPIUM-ALBUTEROL 0.5-2.5 (3) MG/3ML IN SOLN
3.0000 mL | Freq: Four times a day (QID) | RESPIRATORY_TRACT | Status: DC
  Administered 2023-07-06 – 2023-07-08 (×5): 3 mL via RESPIRATORY_TRACT
  Filled 2023-07-06 (×7): qty 3

## 2023-07-06 MED ORDER — OXYCODONE-ACETAMINOPHEN 5-325 MG PO TABS
1.0000 | ORAL_TABLET | Freq: Four times a day (QID) | ORAL | Status: DC | PRN
  Administered 2023-07-06 (×2): 1 via ORAL
  Filled 2023-07-06 (×2): qty 1

## 2023-07-06 MED ORDER — ONDANSETRON HCL 4 MG/2ML IJ SOLN
4.0000 mg | Freq: Four times a day (QID) | INTRAMUSCULAR | Status: DC | PRN
Start: 2023-07-06 — End: 2023-07-08
  Administered 2023-07-06 – 2023-07-08 (×4): 4 mg via INTRAVENOUS
  Filled 2023-07-06 (×4): qty 2

## 2023-07-06 MED ORDER — BUDESONIDE 0.25 MG/2ML IN SUSP
0.2500 mg | Freq: Two times a day (BID) | RESPIRATORY_TRACT | Status: DC
  Administered 2023-07-06 – 2023-07-10 (×6): 0.25 mg via RESPIRATORY_TRACT
  Filled 2023-07-06 (×8): qty 2

## 2023-07-06 MED ORDER — MAGNESIUM SULFATE 2 GM/50ML IV SOLN
2.0000 g | Freq: Once | INTRAVENOUS | Status: AC
  Administered 2023-07-06: 2 g via INTRAVENOUS
  Filled 2023-07-06: qty 50

## 2023-07-06 MED ORDER — GUAIFENESIN ER 600 MG PO TB12
600.0000 mg | ORAL_TABLET | Freq: Two times a day (BID) | ORAL | Status: DC
  Administered 2023-07-06 – 2023-07-10 (×9): 600 mg via ORAL
  Filled 2023-07-06 (×9): qty 1

## 2023-07-06 MED ORDER — IPRATROPIUM-ALBUTEROL 0.5-2.5 (3) MG/3ML IN SOLN: 3.0000 mL | RESPIRATORY_TRACT | Status: DC | PRN

## 2023-07-06 MED ORDER — BUMETANIDE 1 MG PO TABS
1.0000 mg | ORAL_TABLET | Freq: Every day | ORAL | Status: DC
  Filled 2023-07-06: qty 1

## 2023-07-06 MED ORDER — OXYCODONE-ACETAMINOPHEN 5-325 MG PO TABS
2.0000 | ORAL_TABLET | Freq: Four times a day (QID) | ORAL | Status: DC | PRN
  Administered 2023-07-06 – 2023-07-08 (×5): 2 via ORAL
  Filled 2023-07-06 (×7): qty 2

## 2023-07-06 NOTE — Progress Notes (Signed)
Progress Note   Patient: [name] [name] ZOX:[id number] DOB: 21-Jul-1970 DOA: 07/05/2023     1 DOS: the patient was seen and examined on 07/06/2023   Brief hospital course: [name] [name] was admitted to the hospital with the working diagnosis of who presented heart failure exacerbation in the setting of asthma exacerbation.   53 yo female with the past medical history of COPD, Asthma, T2DM, depression, bipolar disorder, heart failure and chronic pain syndrome who presented with chest pain, cough, and wheezing. Persistent and worsening symptoms for 7 days, reported PND, orthopnea and lower extremity edema. Because severe symptoms she called EMS. She was found with 02 saturation 88% on room air, she was placed on supplemental 02 and was transported to the ED.  On her initial physical examination her blood pressure was 126/88, HR 99, RR 27 and 02 saturation 91%, lungs with decreased breath sounds, positive rales and wheezing, prolonged expiratory phase, heart with S1 and S2 present and regular with no gallops, rubs or murmurs, abdomen with no distention, no lower extremity edema.   Chest radiograph with cardiomegaly, mild hilar vascular congestion with no infiltrates or effusions.   EKG 105 bpm, right axis deviation, qtc 511, sinus rhythm with left atrial enlargement, no significant ST segment or T wave changes.   Patient was placed on IV diuresis.  01/26 volume status has improved, but continue to have wheezing and clinical signs of air trapping.  Placed on bronchodilator therapy and systemic corticosteroids.    Assessment and Plan: * Acute on chronic systolic CHF (congestive heart failure) (HCC) Echocardiogram with EF 20 to 25%, with global hypokinesis, dilatation of LV cavity, RV systolic function preserved, LA dilatated, severe mitral regurgitation, trivial pericardial effusion.   Clinically with improvement in volume status.   Plan to transition to po bumetanide.  Continue spironolactone,   Blood pressure support with midodrine. Limited pharmacologic therapy due to risk of hypotension.   CKD stage 3a, GFR 45-59 ml/min (HCC) Hypokalemia.   Renal function with serum cr at 1,40 with K at 3,4 and serum bicarbonate at 29  Na 137  Mg 1,7   Add 40 meq Kcl x 2  Add 2 g mag sulfate to avoid hypomagnesemia.  Follow up renal function and electrolytes.    DM2 (diabetes mellitus, type 2) (HCC) Continue glucose cover and monitoring with insulin sliding scale.    HTN (hypertension) Continue blood pressure support with midodrine.   COPD (chronic obstructive pulmonary disease) (HCC) Acute asthma exacerbation.   Plan to continue bronchodilator therapy. Add inhaled and systemic corticosteroids.  Airway clearing techniques with flutter valve and incentive spirometer.   Chronic pain syndrome Continue with oxycodone for pain control.  Continue sertraline,   Bipolar 1 disorder (HCC) Continue with sertraline,         Subjective: Patient with improvement in edema, but continue to have dyspnea and wheezing, no chest pain, no PND or orthopnea,   Physical Exam: Vitals:   07/06/23 0338 07/06/23 0722 07/06/23 0755 07/06/23 1117  BP: 92/68 (!) 81/70 94/74 125/82  Pulse: 89 86 83 94  Resp: 20 (!) 21 (!) 22 (!) 22  Temp: 98 F (36.7 C) 98.1 F (36.7 C) 98.2 F (36.8 C) 97.7 F (36.5 C)  TempSrc: Oral Oral Oral Oral  SpO2: 97% 98% 97% 100%  Weight: 70.2 kg     Height:       Neurology awake and alert ENT with mild pallor Cardiovascular with S1 and S2 present and regular with no gallops,  rubs or murmurs No JVD No lower extremity or abdominal wall edema Respiratory with prolonged expiratory phase with expiratory wheezing with no rales or rhonchi Abdomen with no distention  Data Reviewed:    Family Communication: no family at the bedside   Disposition: Status is: Inpatient Remains inpatient appropriate because: respiratory support   Planned Discharge Destination:  Home      Author: Coralie Keens, MD 07/06/2023 3:44 PM  For on call review [email or web address].

## 2023-07-06 NOTE — Progress Notes (Signed)
Instructed patient on use of I/S and patient stated "I know how to use it." Patient got I/S up to . She stated she would use it more after her pain was relieved. Patient sleeping last check on her.

## 2023-07-06 NOTE — Assessment & Plan Note (Signed)
Hypokalemia.   Renal function with serum cr at 1,40 with K at 3,4 and serum bicarbonate at 29  Na 137  Mg 1,7   Add 40 meq Kcl x 2  Add 2 g mag sulfate to avoid hypomagnesemia.  Follow up renal function and electrolytes.

## 2023-07-06 NOTE — Assessment & Plan Note (Signed)
Acute asthma exacerbation.   Plan to continue bronchodilator therapy. Add inhaled and systemic corticosteroids.  Airway clearing techniques with flutter valve and incentive spirometer.

## 2023-07-06 NOTE — Assessment & Plan Note (Signed)
Continue blood pressure support with midodrine.

## 2023-07-06 NOTE — Assessment & Plan Note (Signed)
Uncontrolled hyperglycemia. Steroid induced hyperglycemia.   Continue glucose cover and monitoring with insulin sliding scale.  Continue with  basal insulin 10 units bid, add pre meal insulin  Close capillary glucose monitoring

## 2023-07-06 NOTE — Assessment & Plan Note (Signed)
Echocardiogram with EF 20 to 25%, with global hypokinesis, dilatation of LV cavity, RV systolic function preserved, LA dilatated, severe mitral regurgitation, trivial pericardial effusion.   Clinically with improvement in volume status.   resume bumetanide today Continue spironolactone,  Blood pressure support with midodrine. Limited pharmacologic therapy due to risk of hypotension.

## 2023-07-06 NOTE — Assessment & Plan Note (Signed)
Continue with oxycodone for pain control.  For severe pain added hydromorphone as needed.  Continue sertraline,

## 2023-07-06 NOTE — Hospital Course (Signed)
[name] [name] was admitted to the hospital with the working diagnosis of who presented heart failure exacerbation in the setting of asthma exacerbation.   53 yo female with the past medical history of COPD, Asthma, T2DM, depression, bipolar disorder, heart failure and chronic pain syndrome who presented with chest pain, cough, and wheezing. Persistent and worsening symptoms for 7 days, reported PND, orthopnea and lower extremity edema. Because severe symptoms she called EMS. She was found with 02 saturation 88% on room air, she was placed on supplemental 02 and was transported to the ED.  On her initial physical examination her blood pressure was 126/88, HR 99, RR 27 and 02 saturation 91%, lungs with decreased breath sounds, positive rales and wheezing, prolonged expiratory phase, heart with S1 and S2 present and regular with no gallops, rubs or murmurs, abdomen with no distention, no lower extremity edema.   Chest radiograph with cardiomegaly, mild hilar vascular congestion with no infiltrates or effusions.   EKG 105 bpm, right axis deviation, qtc 511, sinus rhythm with left atrial enlargement, no significant ST segment or T wave changes.   Patient was placed on IV diuresis.  01/26 volume status has improved, but continue to have wheezing and clinical signs of air trapping.  Placed on bronchodilator therapy and systemic corticosteroids.  01/27 improvement in wheezing with steroids. Positive hyperglycemia.  01/28 improved air movement and less wheezing,

## 2023-07-06 NOTE — Progress Notes (Signed)
TRH night cross cover note:   I was notified by RN of the patient's request for resumption of home Percocet 10/325 mg p.o. every 6 hours prn.  As she is here with acute COPD exacerbation, will be slightly conservative with resumption of her outpatient opioid.  Consequently, I have ordered Percocet 5/325 mg p.o. every 6 hours as needed for now, and have added order for incentive spirometry.     Newton Pigg, DO Hospitalist

## 2023-07-06 NOTE — Assessment & Plan Note (Signed)
Continue with sertraline,

## 2023-07-07 DIAGNOSIS — E119 Type 2 diabetes mellitus without complications: Secondary | ICD-10-CM | POA: Diagnosis not present

## 2023-07-07 DIAGNOSIS — N1831 Chronic kidney disease, stage 3a: Secondary | ICD-10-CM | POA: Diagnosis not present

## 2023-07-07 DIAGNOSIS — I5023 Acute on chronic systolic (congestive) heart failure: Secondary | ICD-10-CM | POA: Diagnosis not present

## 2023-07-07 DIAGNOSIS — I159 Secondary hypertension, unspecified: Secondary | ICD-10-CM | POA: Diagnosis not present

## 2023-07-07 LAB — BASIC METABOLIC PANEL
Anion gap: 12 (ref 5–15)
BUN: 40 mg/dL — ABNORMAL HIGH (ref 6–20)
CO2: 23 mmol/L (ref 22–32)
Calcium: 8.1 mg/dL — ABNORMAL LOW (ref 8.9–10.3)
Chloride: 97 mmol/L — ABNORMAL LOW (ref 98–111)
Creatinine, Ser: 1.7 mg/dL — ABNORMAL HIGH (ref 0.44–1.00)
GFR, Estimated: 35 mL/min — ABNORMAL LOW (ref >60)
Glucose, Bld: 397 mg/dL — ABNORMAL HIGH (ref 70–99)
Potassium: 4.8 mmol/L (ref 3.5–5.1)
Sodium: 132 mmol/L — ABNORMAL LOW (ref 135–145)

## 2023-07-07 LAB — GLUCOSE, CAPILLARY
Glucose-Capillary: 431 mg/dL — ABNORMAL HIGH (ref 70–99)
Glucose-Capillary: 437 mg/dL — ABNORMAL HIGH (ref 70–99)
Glucose-Capillary: 343 mg/dL — ABNORMAL HIGH (ref 70–99)
Glucose-Capillary: 344 mg/dL — ABNORMAL HIGH (ref 70–99)
Glucose-Capillary: 299 mg/dL — ABNORMAL HIGH (ref 70–99)

## 2023-07-07 LAB — MAGNESIUM: Magnesium: 2.2 mg/dL (ref 1.7–2.4)

## 2023-07-07 MED ORDER — INSULIN ASPART 100 UNIT/ML IJ SOLN
8.0000 [IU] | Freq: Once | INTRAMUSCULAR | Status: AC
  Administered 2023-07-07: 8 [IU] via SUBCUTANEOUS

## 2023-07-07 MED ORDER — INSULIN GLARGINE-YFGN 100 UNIT/ML ~~LOC~~ SOLN
10.0000 [IU] | Freq: Two times a day (BID) | SUBCUTANEOUS | Status: DC
Start: 2023-07-07 — End: 2023-07-09
  Administered 2023-07-07 – 2023-07-08 (×4): 10 [IU] via SUBCUTANEOUS
  Filled 2023-07-07 (×6): qty 0.1

## 2023-07-07 MED ORDER — HYDROMORPHONE HCL 1 MG/ML IJ SOLN
0.5000 mg | INTRAMUSCULAR | Status: DC | PRN
  Administered 2023-07-07 – 2023-07-09 (×12): 0.5 mg via INTRAVENOUS
  Filled 2023-07-07 (×12): qty 0.5

## 2023-07-07 NOTE — TOC Initial Note (Signed)
Transition of Care [facility]) - Initial/Assessment Note    Patient Details  Name: [name] [name] MRN: [id number] Date of Birth: May 15, 1971  [facility]) CM/SW Contact:    Harriet Masson, RN Phone Number: 07/07/2023, 3:18 PM  Clinical Narrative:                 Spoke to patient regarding transition needs. Patient lives with a friend and son transports her to apts.  Patient has no DME and has never had home health or OP rehab. PCP confirmed. Currently on 3L 02. TOC following.  Expected Discharge Plan: Home/Self Care Barriers to Discharge: Continued Medical Work up   Patient Goals and CMS Choice Patient states their goals for this hospitalization and ongoing recovery are:: return home          Expected Discharge Plan and Services   Discharge Planning Services: CM Consult   Living arrangements for the past 2 months: Apartment                                      Prior Living Arrangements/Services Living arrangements for the past 2 months: Apartment Lives with:: Friends Patient language and need for interpreter reviewed:: Yes Do you feel safe going back to the place where you live?: Yes      Need for Family Participation in Patient Care: Yes (Comment) Care giver support system in place?: Yes (comment)   Criminal Activity/Legal Involvement Pertinent to Current Situation/Hospitalization: No - Comment as needed  Activities of Daily Living   ADL Screening (condition at time of admission) Independently performs ADLs?: Yes (appropriate for developmental age) Is the patient deaf or have difficulty hearing?: No Does the patient have difficulty seeing, even when wearing glasses/contacts?: No Does the patient have difficulty concentrating, remembering, or making decisions?: No  Permission Sought/Granted                  Emotional Assessment Appearance:: Appears stated age Attitude/Demeanor/Rapport: Engaged Affect (typically observed):  Accepting Orientation: : Oriented to Self, Oriented to Place, Oriented to  Time, Oriented to Situation Alcohol / Substance Use: Not Applicable Psych Involvement: No (comment)  Admission diagnosis:  Acute exacerbation of CHF (congestive heart failure) (HCC) [I50.9] Acute combined systolic and diastolic congestive heart failure (HCC) [I50.41] Patient Active Problem List   Diagnosis Date Noted   Bipolar 1 disorder (HCC) 07/06/2023   Acute exacerbation of CHF (congestive heart failure) (HCC) 07/05/2023   Chronic pain syndrome 05/25/2023   Heart failure (HCC) 05/23/2023   CHF (congestive heart failure) (HCC) 05/22/2023   Hypomagnesemia 05/22/2023   Hypotension 02/14/2023   Prolonged QT interval 09/17/2022   Severe mitral regurgitation 09/17/2022   CKD stage 3a, GFR 45-59 ml/min (HCC) 09/17/2022   Hyperthyroidism 08/29/2022   COPD (chronic obstructive pulmonary disease) (HCC) 08/04/2022   Chronic systolic CHF (congestive heart failure) (HCC) 08/03/2022   Acute on chronic systolic CHF (congestive heart failure) (HCC) 05/15/2022   Overdose, intentional self-harm, initial encounter (HCC) 12/13/2018   DM2 (diabetes mellitus, type 2) (HCC) 01/03/2018   HTN (hypertension) 08/28/2015   PCP:  [facility]:   [id number] [place], [place] - [address] AT [facility] [address] [place] [address] Phone: [phone] Fax: [phone]     Social Drivers of Health (SDOH) Social History: SDOH Screenings   Food  Insecurity: No Food Insecurity (07/06/2023)  Recent Concern: Food Insecurity - Food Insecurity Present (07/06/2023)  Housing: Low Risk  (07/06/2023)  Transportation Needs: No Transportation Needs (07/06/2023)  Utilities: Not At Risk (07/06/2023)  Alcohol Screen: Low Risk  (02/12/2023)  Depression (PHQ2-9): High Risk (09/30/2022)  Financial Resource Strain: High Risk (09/30/2022)  Social Connections: Unknown  (07/06/2023)  Stress: No Stress Concern Present (01/07/2022)   [facility]  Tobacco Use: High Risk (07/05/2023)   SDOH Interventions:     Readmission Risk Interventions    02/12/2023    5:00 PM 09/02/2022    2:41 PM 08/28/2022    3:52 PM  Readmission Risk Prevention Plan  Transportation Screening Complete Complete Complete  Medication Review Oceanographer)  Complete Complete  PCP or Specialist appointment within 3-5 days of discharge  Complete Complete  HRI or Home Care Consult  Complete Complete  SW Recovery Care/Counseling Consult  --   Palliative Care Screening  Not Applicable Not Applicable  Skilled Nursing Facility  Not Applicable Not Applicable

## 2023-07-07 NOTE — Progress Notes (Signed)
TRH night cross cover note:   I was notified by RN that the patient's most recent CBG result of 431.  I subsequently ordered NovoLog 8 units subcu x 1 dose now, with repeat CBG to be checked in approximately an hour, coinciding with timeframe for qac sliding scale insulin.      Newton Pigg, DO Hospitalist

## 2023-07-07 NOTE — Inpatient Diabetes Management (Signed)
Inpatient Diabetes Program Recommendations  AACE/ADA: New Consensus Statement on Inpatient Glycemic Control (2015)  Target Ranges:  Prepandial:   less than 140 mg/dL      Peak postprandial:   less than 180 mg/dL (1-2 hours)      Critically ill patients:  140 - 180 mg/dL   Lab Results  Component Value Date   GLUCAP 343 (H) 07/07/2023   HGBA1C 8.2 (H) 05/22/2023    Review of Glycemic Control  Latest Reference Range & Units 07/07/23 06:19 07/07/23 07:27 07/07/23 10:36  Glucose-Capillary 70 - 99 mg/dL 409 (H) 811 (H) 914 (H)   Diabetes history: DM 2 Outpatient Diabetes medications:  Toujeo 10 units daily Current orders for Inpatient glycemic control:  Novolog 0-15 units tid with meals and HS Semglee 10 units bid Solumedrol 40 mg IV q 12 hours Inpatient Diabetes Program Recommendations:    Agree with addition of Semglee.  May consider increasing frequency of Novolog correction to q 4 hours.   Thanks,  Lorenza Cambridge, RN, BC-ADM Inpatient Diabetes Coordinator Pager [phone]

## 2023-07-07 NOTE — Progress Notes (Signed)
Heart Failure Navigator Progress Note  Assessed for Heart & Vascular TOC clinic readiness.  Patient does not meet criteria due to Advanced Heart Failure Team patient of Dr. Gasper Lloyd.   Navigator will sign off at this time.   Rhae Hammock, BSN, Scientist, clinical (histocompatibility and immunogenetics) Only

## 2023-07-07 NOTE — Progress Notes (Addendum)
Progress Note   Patient: [name] [name]:[id number] DOB: 05-Jan-1971 DOA: 07/05/2023     2 DOS: the patient was seen and examined on 07/07/2023   Brief hospital course: Mrs. Escamilla was admitted to the hospital with the working diagnosis of who presented heart failure exacerbation in the setting of asthma exacerbation.   53 yo female with the past medical history of COPD, Asthma, T2DM, depression, bipolar disorder, heart failure and chronic pain syndrome who presented with chest pain, cough, and wheezing. Persistent and worsening symptoms for 7 days, reported PND, orthopnea and lower extremity edema. Because severe symptoms she called EMS. She was found with 02 saturation 88% on room air, she was placed on supplemental 02 and was transported to the ED.  On her initial physical examination her blood pressure was 126/88, HR 99, RR 27 and 02 saturation 91%, lungs with decreased breath sounds, positive rales and wheezing, prolonged expiratory phase, heart with S1 and S2 present and regular with no gallops, rubs or murmurs, abdomen with no distention, no lower extremity edema.   Chest radiograph with cardiomegaly, mild hilar vascular congestion with no infiltrates or effusions.   EKG 105 bpm, right axis deviation, qtc 511, sinus rhythm with left atrial enlargement, no significant ST segment or T wave changes.   Patient was placed on IV diuresis.  01/26 volume status has improved, but continue to have wheezing and clinical signs of air trapping.  Placed on bronchodilator therapy and systemic corticosteroids.  01/27 improvement in wheezing with steroids. Positive hyperglycemia.    Assessment and Plan: * Acute on chronic systolic CHF (congestive heart failure) (HCC) Echocardiogram with EF 20 to 25%, with global hypokinesis, dilatation of LV cavity, RV systolic function preserved, LA dilatated, severe mitral regurgitation, trivial pericardial effusion.   Clinically with improvement in volume  status.   Will hold on bumetanide for now  Continue spironolactone,  Blood pressure support with midodrine. Limited pharmacologic therapy due to risk of hypotension.   CKD stage 3a, GFR 45-59 ml/min (HCC) AKI Hypokalemia.   Renal function with serum cr at 1,7 with K at 4,8 and serum bicarbonate at 23  Na 132 Mg 2.2   Hold loop diuretic for now Check renal function and electrolytes in am.   DM2 (diabetes mellitus, type 2) (HCC) Uncontrolled hyperglycemia. Steroid induced hyperglycemia.   Continue glucose cover and monitoring with insulin sliding scale.  Add basal insulin 10 units bid  Close capillary glucose monitoring    HTN (hypertension) Continue blood pressure support with midodrine.   COPD (chronic obstructive pulmonary disease) (HCC) Acute asthma / COPD exacerbation.   Plan to continue bronchodilator therapy. Continue with inhaled and systemic corticosteroids.  Airway clearing techniques with flutter valve and incentive spirometer.   Chronic pain syndrome Continue with oxycodone for pain control.  For severe pain added hydromorphone as needed.  Continue sertraline,   Bipolar 1 disorder (HCC) Continue with sertraline,         Subjective: Dyspnea has improved but not at baseline, continue to have cough and increased sputum production, no chest pain, no orthopnea or PND   Physical Exam: Vitals:   07/07/23 0000 07/07/23 0359 07/07/23 0715 07/07/23 1036  BP: (!) 86/58 104/63 97/78 (!) 98/48  Pulse:  76 87 79  Resp:  19 19 (!) 23  Temp: 97.7 F (36.5 C) 97.8 F (36.6 C) 97.8 F (36.6 C) 97.8 F (36.6 C)  TempSrc: Oral Oral Oral Oral  SpO2:  96% 97% 97%  Weight:  71.1  kg    Height:       Neurology awake and alert ENT with mild pallor Cardiovascular with S1 and S2 present and regular with positive systolic murmur at the apex No JVD No  lower extremity edema Respiratory with bilateral rhonchi and expiratory wheezing, no rales, positive prolonged  expiratory phase Abdomen with no distention or wall edema  Data Reviewed:    Family Communication: no family at the bedside   Disposition: Status is: Inpatient Remains inpatient appropriate because: pending recovery of COPD / asthma exacerbation   Planned Discharge Destination: Home    Author: Coralie Keens, MD 07/07/2023 11:34 AM  For on call review [email or web address].

## 2023-07-07 NOTE — TOC CM/SW Note (Signed)
Transition of Care [facility]) - Inpatient Brief Assessment   Patient Details  Name: [name] [name] MRN: [id number] Date of Birth: [birth year]  Transition of Care [facility]) CM/SW Contact:    Harriet Masson, RN Phone Number: 07/07/2023, 12:42 PM   Clinical Narrative:  Patient admitted for Acute on chronic systolic CHF. Patient on 2 L 02.  No TOC needs at this time.  Transition of Care Asessment: Insurance and Status: Insurance coverage has been reviewed Patient has primary care physician: Yes Home environment has been reviewed: safe to discharge home Prior level of function:: independent Prior/Current Home Services: No current home services Social Drivers of Health Review: SDOH reviewed no interventions necessary Readmission risk has been reviewed: Yes Transition of care needs: no transition of care needs at this time

## 2023-07-08 DIAGNOSIS — I5023 Acute on chronic systolic (congestive) heart failure: Secondary | ICD-10-CM | POA: Diagnosis not present

## 2023-07-08 DIAGNOSIS — E119 Type 2 diabetes mellitus without complications: Secondary | ICD-10-CM | POA: Diagnosis not present

## 2023-07-08 DIAGNOSIS — I159 Secondary hypertension, unspecified: Secondary | ICD-10-CM | POA: Diagnosis not present

## 2023-07-08 DIAGNOSIS — N1831 Chronic kidney disease, stage 3a: Secondary | ICD-10-CM | POA: Diagnosis not present

## 2023-07-08 LAB — GLUCOSE, CAPILLARY
Glucose-Capillary: 475 mg/dL — ABNORMAL HIGH (ref 70–99)
Glucose-Capillary: 504 mg/dL (ref 70–99)
Glucose-Capillary: 358 mg/dL — ABNORMAL HIGH (ref 70–99)
Glucose-Capillary: 296 mg/dL — ABNORMAL HIGH (ref 70–99)
Glucose-Capillary: 255 mg/dL — ABNORMAL HIGH (ref 70–99)

## 2023-07-08 LAB — BASIC METABOLIC PANEL
Anion gap: 11 (ref 5–15)
BUN: 46 mg/dL — ABNORMAL HIGH (ref 6–20)
CO2: 27 mmol/L (ref 22–32)
Calcium: 8.6 mg/dL — ABNORMAL LOW (ref 8.9–10.3)
Chloride: 95 mmol/L — ABNORMAL LOW (ref 98–111)
Creatinine, Ser: 1.76 mg/dL — ABNORMAL HIGH (ref 0.44–1.00)
GFR, Estimated: 34 mL/min — ABNORMAL LOW (ref >60)
Glucose, Bld: 358 mg/dL — ABNORMAL HIGH (ref 70–99)
Potassium: 4.8 mmol/L (ref 3.5–5.1)
Sodium: 133 mmol/L — ABNORMAL LOW (ref 135–145)

## 2023-07-08 MED ORDER — IPRATROPIUM-ALBUTEROL 0.5-2.5 (3) MG/3ML IN SOLN
3.0000 mL | Freq: Two times a day (BID) | RESPIRATORY_TRACT | Status: DC
  Administered 2023-07-08 – 2023-07-10 (×3): 3 mL via RESPIRATORY_TRACT
  Filled 2023-07-08 (×4): qty 3

## 2023-07-08 MED ORDER — SODIUM CHLORIDE 0.9 % IV SOLN
12.5000 mg | Freq: Four times a day (QID) | INTRAVENOUS | Status: DC | PRN
  Administered 2023-07-09 (×2): 12.5 mg via INTRAVENOUS
  Filled 2023-07-08 (×2): qty 12.5

## 2023-07-08 MED ORDER — ZOLPIDEM TARTRATE 5 MG PO TABS
5.0000 mg | ORAL_TABLET | Freq: Every evening | ORAL | Status: DC | PRN
  Administered 2023-07-08: 5 mg via ORAL
  Filled 2023-07-08 (×2): qty 1

## 2023-07-08 MED ORDER — INSULIN ASPART 100 UNIT/ML IJ SOLN
5.0000 [IU] | Freq: Three times a day (TID) | INTRAMUSCULAR | Status: DC
  Administered 2023-07-08 (×2): 5 [IU] via SUBCUTANEOUS

## 2023-07-08 MED ORDER — BUMETANIDE 1 MG PO TABS
1.0000 mg | ORAL_TABLET | Freq: Every day | ORAL | Status: DC
  Administered 2023-07-08 – 2023-07-09 (×2): 1 mg via ORAL
  Filled 2023-07-08 (×4): qty 1

## 2023-07-08 MED ORDER — METHYLPREDNISOLONE SODIUM SUCC 40 MG IJ SOLR
40.0000 mg | Freq: Every day | INTRAMUSCULAR | Status: DC
Start: 2023-07-08 — End: 2023-07-09
  Administered 2023-07-08: 40 mg via INTRAVENOUS
  Filled 2023-07-08: qty 1

## 2023-07-08 NOTE — Evaluation (Signed)
Occupational Therapy Evaluation Patient Details Name: AMAURI MEDELLIN MRN: [id number] DOB: 11/25/1970 Today's Date: 07/08/2023   History of Present Illness 53 yo female who presented with chest pain, cough, and wheezing on 07/05/23. Marland Kitchen PMH: COPD, Asthma, T2DM, depression, bipolar disorder, heart failure and chronic pain syndrome   Clinical Impression   PTA, pt lived with friend and friend's husband; pt reports being mod I in the home but states she normally eats meals she does not have to cook such as sandwiches. Upon eval, pt with decreased activity tolerance ans safety with mobility. Pt seems very aware of DM management methods/medications she currently uses at home, however, pt reports more difficulty reading medication labels over the past 6 months; encouraged ophthalmologist follow up. Pt requiring CGA for short distance mobility in room and standing ADL at sink. Recommending HHOT to optimize safety and decrease fall risk in home setting. Pt reports that she does have O2 at home but does not wear due to it bringing up the power bill; have notified CM to explore potential resources.       If plan is discharge home, recommend the following: A little help with walking and/or transfers;A little help with bathing/dressing/bathroom;Assistance with cooking/housework;Assist for transportation;Help with stairs or ramp for entrance    Functional Status Assessment  Patient has had a recent decline in their functional status and demonstrates the ability to make significant improvements in function in a reasonable and predictable amount of time.  Equipment Recommendations  Tub/shower seat    Recommendations for Other Services       Precautions / Restrictions Precautions Precautions: Fall Precaution Comments: soft BP, reports vision impairments from progressive diabetes      Mobility Bed Mobility Overal bed mobility: Needs Assistance Bed Mobility: Supine to Sit     Supine to sit: Contact  guard     General bed mobility comments: contact guard for safety/lines    Transfers Overall transfer level: Needs assistance Equipment used: None Transfers: Sit to/from Stand Sit to Stand: Contact guard assist           General transfer comment: Pt reports feelin mildly whoozy on standing but VSS      Balance Overall balance assessment: Mild deficits observed, not formally tested                                         ADL either performed or assessed with clinical judgement   ADL Overall ADL's : Needs assistance/impaired Eating/Feeding: Modified independent;Sitting   Grooming: Contact guard assist;Standing;Wash/dry hands;Oral care   Upper Body Bathing: Set up;Sitting   Lower Body Bathing: Contact guard assist;Sit to/from stand   Upper Body Dressing : Set up;Sitting   Lower Body Dressing: Contact guard assist;Sit to/from stand   Toilet Transfer: Contact guard assist;Ambulation           Functional mobility during ADLs: Contact guard assist       Vision   Additional Comments: Pt reports "50/50" vision in R eye at baseline; additionally reports that vision does not feel different but over the past 6 months when she goes to read has difficulty reading medication lables.     Perception         Praxis         Pertinent Vitals/Pain Pain Assessment Pain Assessment: Faces Faces Pain Scale: Hurts little more Pain Location: low back, and L hip Pain Descriptors /  Indicators: Aching     Extremity/Trunk Assessment Upper Extremity Assessment Upper Extremity Assessment: Generalized weakness   Lower Extremity Assessment Lower Extremity Assessment: Generalized weakness   Cervical / Trunk Assessment Cervical / Trunk Assessment: Normal   Communication Communication Communication: No apparent difficulties   Cognition Arousal: Alert Behavior During Therapy: Lability (tangential) Overall Cognitive Status: No family/caregiver present to  determine baseline cognitive functioning                                 General Comments: pt oriented x4 however presents with decreased insight to safety and deficits however suspect this to be baseline. reports visual decline over 6 months and more difficulty reading medication labels but has not attempted for follow up with eye doctor     General Comments  2L supplementary O2 at time of OT eval    Exercises     Shoulder Instructions      Home Living Family/patient expects to be discharged to:: Private residence Living Arrangements: Non-relatives/Friends Available Help at Discharge: Friend(s);Available PRN/intermittently (friend works, however friends spouse is home but he has Alzheimers and pt reports "I know it's like the blind leading the blind." - pt reports she does have to remind friend's spouse to do ADL/take meds, etc) Type of Home: House Home Access: Stairs to enter [employer] of Steps: 2 Entrance Stairs-Rails: Right Home Layout: One level     Bathroom Shower/Tub: Chief Strategy Officer: Standard     Home Equipment: None   Additional Comments: pt reports staying with her friend since July, prior to that pt was homeless      Prior Functioning/Environment Prior Level of Function : Needs assist             Mobility Comments: indep without AD, however doesn't drive due to impaired vision, doesn't work, and provides assist to her friends husband ADLs Comments: indep        OT Problem List: Decreased strength;Decreased activity tolerance;Impaired balance (sitting and/or standing);Decreased safety awareness;Decreased knowledge of use of DME or AE;Cardiopulmonary status limiting activity      OT Treatment/Interventions: Self-care/ADL training;Therapeutic exercise;DME and/or AE instruction;Balance training;Patient/family education;Therapeutic activities    OT Goals(Current goals can be found in the care plan section) Acute  Rehab OT Goals Patient Stated Goal: go home OT Goal Formulation: With patient Time For Goal Achievement: 07/22/23 Potential to Achieve Goals: Good  OT Frequency: Min 1X/week    Co-evaluation              AM-PAC OT "6 Clicks" Daily Activity     Outcome Measure Help from another person eating meals?: None Help from another person taking care of personal grooming?: A Little Help from another person toileting, which includes using toliet, bedpan, or urinal?: A Little Help from another person bathing (including washing, rinsing, drying)?: A Little Help from another person to put on and taking off regular upper body clothing?: A Little Help from another person to put on and taking off regular lower body clothing?: A Little 6 Click Score: 19   End of Session Equipment Utilized During Treatment: Gait belt Nurse Communication: Mobility status  Activity Tolerance: Patient tolerated treatment well Patient left: in bed;with call bell/phone within reach;with bed alarm set  OT Visit Diagnosis: Unsteadiness on feet (R26.81);Muscle weakness (generalized) (M62.81);Low vision, both eyes (H54.2)                Time: 4098-1191  OT Time Calculation (min): 22 min Charges:  OT General Charges $OT Visit: 1 Visit OT Evaluation $OT Eval Moderate Complexity: 1 Mod  Tyler Deis, OTR/L [facility] Acute Rehabilitation Office: [phone]   Myrla Halsted 07/08/2023, 12:45 PM

## 2023-07-08 NOTE — Progress Notes (Signed)
PROGRESS NOTE    [name] [name]  ZOX:[id number] DOB: [birth year] DOA: 07/05/2023 PCP: [facility]  53/F w COPD, Asthma, T2DM, depression, bipolar disorder, systolic CHF and chronic pain syndrome who presented with chest pain, cough, and wheezing. X 7 days, reported PND, orthopnea and lower extremity edema. In ED, Hypoxic, wheezing, Chest radiograph mild hilar vascular congestion  Patient was placed on IV diuresis. 1/26 volume status has improved, but persistent wheezing  Placed on bronchodilator therapy and systemic corticosteroids.  1/27 improvement in wheezing with steroids. Positive hyperglycemia.    Subjective: Reports mild dyspnea with exertion, has been requiring IV Dilaudid for her chronic pain, overnight with staff concerns related to using meds from her bag   Assessment and Plan:  Acute on chronic systolic CHF  -Echo EF 20 to 25%, with global hypokinesis, dilatation of LV cavity, RV systolic function preserved, LA dilatated, severe mitral regurgitation, trivial pericardial effusion.  Diuresed with IV Lasix, volume status has improved, now switched to oral Bumex -Continue spironolactone,  -Cut down midodrine, resume Jardiance tomorrow if blood pressure tolerates GDMT limited by hypotension  CKD stage 3a, GFR 45-59 ml/min (HCC) AKI Hypokalemia.  -Improving, Bumex resumed  DM2 (diabetes mellitus, type 2) (HCC) Uncontrolled hyperglycemia. Steroid induced hyperglycemia.  -Increase basal insulin, increase meal coverage insulin  HTN (hypertension) Continue midodrine.   COPD (chronic obstructive pulmonary disease) (HCC) Acute asthma / COPD exacerbation.  -continue bronchodilator therapy. -Change IV steroids to prednisone -flutter valve and incentive spirometer.   Chronic pain syndrome Continue with oxycodone for pain control.  Discontinue Dilaudid Continue sertraline,   Bipolar 1 disorder (HCC) Continue with sertraline,   DVT prophylaxis:lovenox Code  Status: Full Code Family Communication: None present Disposition Plan: Home tomorrow  Consultants:    Procedures:   Antimicrobials:    Objective: Vitals:   07/08/23 0239 07/08/23 0741 07/08/23 1100 07/08/23 1658  BP: 105/70 101/64 113/82 (!) 116/90  Pulse: 93  83 90  Resp: 18 19 20 19   Temp: 97.7 F (36.5 C) 97.7 F (36.5 C) 97.8 F (36.6 C) 97.8 F (36.6 C)  TempSrc: Oral Oral Oral Oral  SpO2: 92% 97% 95% 91%  Weight: 72.6 kg     Height:        Intake/Output Summary (Last 24 hours) at 07/08/2023 1711 Last data filed at 07/08/2023 1147 Gross per 24 hour  Intake 850 ml  Output 300 ml  Net 550 ml   Filed Weights   07/06/23 0338 07/07/23 0359 07/08/23 0239  Weight: 70.2 kg 71.1 kg 72.6 kg    Examination:  Gen: Awake, Alert, Oriented X 3,  HEENT: no JVD Lungs: Good air movement bilaterally, CTAB CVS: S1S2/RRR Abd: soft, Non tender, non distended, BS present Extremities: No edema Skin: no new rashes on exposed skin   Data Reviewed:   CBC: Recent Labs  Lab 07/05/23 1625 07/06/23 0011  WBC 7.1 6.3  NEUTROABS 5.9  --   HGB 11.5* 10.9*  HCT 35.7* 33.6*  MCV 89.5 89.4  PLT 183 181   Basic Metabolic Panel: Recent Labs  Lab 07/05/23 1625 07/06/23 0011 07/06/23 0214 07/07/23 0212 07/08/23 0214  NA 138  --  137 132* 133*  K 3.7  --  3.4* 4.8 4.8  CL 98  --  98 97* 95*  CO2 23  --  29 23 27   GLUCOSE 494*  --  287* 397* 358*  BUN 22*  --  28* 40* 46*  CREATININE 1.32* 1.31* 1.40* 1.70* 1.76*  CALCIUM  8.7*  --  8.1* 8.1* 8.6*  MG 1.7  --   --  2.2  --    GFR: Estimated Creatinine Clearance: 36.5 mL/min (A) (by C-G formula based on SCr of 1.76 mg/dL (H)). Liver Function Tests: No results for input(s): "AST", "ALT", "ALKPHOS", "BILITOT", "PROT", "ALBUMIN" in the last 168 hours. No results for input(s): "LIPASE", "AMYLASE" in the last 168 hours. No results for input(s): "AMMONIA" in the last 168 hours. Coagulation Profile: No results for input(s):  "INR", "PROTIME" in the last 168 hours. Cardiac Enzymes: No results for input(s): "CKTOTAL", "CKMB", "CKMBINDEX", "TROPONINI" in the last 168 hours. BNP (last 3 results) No results for input(s): "PROBNP" in the last 8760 hours. HbA1C: No results for input(s): "HGBA1C" in the last 72 hours. CBG: Recent Labs  Lab 07/07/23 2100 07/08/23 0608 07/08/23 0740 07/08/23 1058 07/08/23 1656  GLUCAP 299* 475* 504* 358* 296*   Lipid Profile: No results for input(s): "CHOL", "HDL", "LDLCALC", "TRIG", "CHOLHDL", "LDLDIRECT" in the last 72 hours. Thyroid Function Tests: No results for input(s): "TSH", "T4TOTAL", "FREET4", "T3FREE", "THYROIDAB" in the last 72 hours. Anemia Panel: No results for input(s): "VITAMINB12", "FOLATE", "FERRITIN", "TIBC", "IRON", "RETICCTPCT" in the last 72 hours. Urine analysis:    Component Value Date/Time   COLORURINE YELLOW 09/06/2022 0108   APPEARANCEUR HAZY (A) 09/06/2022 0108   LABSPEC 1.026 09/06/2022 0108   PHURINE 5.0 09/06/2022 0108   GLUCOSEU >=500 (A) 09/06/2022 0108   HGBUR NEGATIVE 09/06/2022 0108   HGBUR trace-intact 11/25/2008 1530   BILIRUBINUR NEGATIVE 09/06/2022 0108   KETONESUR NEGATIVE 09/06/2022 0108   PROTEINUR NEGATIVE 09/06/2022 0108   UROBILINOGEN 0.2 05/12/2014 2315   NITRITE NEGATIVE 09/06/2022 0108   LEUKOCYTESUR NEGATIVE 09/06/2022 0108   Sepsis Labs: @LABRCNTIP (procalcitonin:4,lacticidven:4)  ) Recent Results (from the past 240 hours)  Resp panel by RT-PCR (RSV, Flu A&B, Covid) Anterior Nasal Swab     Status: None   Collection Time: 07/05/23  4:52 PM   Specimen: Anterior Nasal Swab  Result Value Ref Range Status   SARS Coronavirus 2 by RT PCR NEGATIVE NEGATIVE Final   Influenza A by PCR NEGATIVE NEGATIVE Final   Influenza B by PCR NEGATIVE NEGATIVE Final    Comment: (NOTE) The Xpert Xpress SARS-CoV-2/FLU/RSV plus assay is intended as an aid in the diagnosis of influenza from Nasopharyngeal swab specimens and should not be  used as a sole basis for treatment. Nasal washings and aspirates are unacceptable for Xpert Xpress SARS-CoV-2/FLU/RSV testing.  Fact Sheet for Patients: BloggerCourse.com  Fact Sheet for Healthcare Providers: SeriousBroker.it  This test is not yet approved or cleared by the [place] FDA and has been authorized for detection and/or diagnosis of SARS-CoV-2 by FDA under an Emergency Use Authorization (EUA). This EUA will remain in effect (meaning this test can be used) for the duration of the COVID-19 declaration under Section 564(b)(1) of the Act, 21 U.S.C. section 360bbb-3(b)(1), unless the authorization is terminated or revoked.     Resp Syncytial Virus by PCR NEGATIVE NEGATIVE Final    Comment: (NOTE) Fact Sheet for Patients: BloggerCourse.com  Fact Sheet for Healthcare Providers: SeriousBroker.it  This test is not yet approved or cleared by the [place] FDA and has been authorized for detection and/or diagnosis of SARS-CoV-2 by FDA under an Emergency Use Authorization (EUA). This EUA will remain in effect (meaning this test can be used) for the duration of the COVID-19 declaration under Section 564(b)(1) of the Act, 21 U.S.C. section 360bbb-3(b)(1), unless the authorization is terminated  or revoked.  Performed at [facility], [address], [place], [place] [address]   MRSA Next Gen by PCR, Nasal     Status: None   Collection Time: 07/05/23 11:00 PM   Specimen: Nasal Mucosa; Nasal Swab  Result Value Ref Range Status   MRSA by PCR Next Gen NOT DETECTED NOT DETECTED Final    Comment: (NOTE) The GeneXpert MRSA Assay (FDA approved for NASAL specimens only), is one component of a comprehensive MRSA colonization surveillance program. It is not intended to diagnose MRSA infection nor to guide or monitor treatment for MRSA infections. Test performance is not  FDA approved in patients less than 20 years old. Performed at [facility], [address] [place], [place] [address]      Radiology Studies: No results found.   Scheduled Meds:  budesonide (PULMICORT) nebulizer solution  0.25 mg Nebulization BID   bumetanide  1 mg Oral Daily   enoxaparin (LOVENOX) injection  40 mg Subcutaneous Q24H   guaiFENesin  600 mg Oral BID   insulin aspart  0-15 Units Subcutaneous TID WC   insulin aspart  0-5 Units Subcutaneous QHS   insulin aspart  5 Units Subcutaneous TID WC   insulin glargine-yfgn  10 Units Subcutaneous BID   ipratropium-albuterol  3 mL Nebulization BID   methylPREDNISolone (SOLU-MEDROL) injection  40 mg Intravenous Daily   midodrine  10 mg Oral TID WC   sertraline  100 mg Oral Daily   sodium chloride flush  3 mL Intravenous Q12H   spironolactone  12.5 mg Oral Daily   Continuous Infusions:   LOS: 3 days    Time spent:    Zannie Cove, MD [facility]   07/08/2023, 5:11 PM

## 2023-07-08 NOTE — Evaluation (Signed)
Physical Therapy Evaluation Patient Details Name: [name] [name] MRN: [id number] DOB: Jun 29, 1970 Today's Date: 07/08/2023  History of Present Illness  53 yo female who presented with chest pain, cough, and wheezing on 07/05/23. Marland Kitchen PMH: COPD, Asthma, T2DM, depression, bipolar disorder, heart failure and chronic pain syndrome   Clinical Impression  Pt admitted with above. Pt reports being indep without AD PTA. 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 Pt currently requiring contact guard/minA for safe amb and 3LO2 via [place] for safe ambulation. Pt with chronic pain and received IV dilaudid prior to PT eval. Pt ambulation distance limited today by onset of whooziness. Most likely due to IV dilaudid recently given. Pt to benefit from [facility] services to progress mobility to indep to minimize fall risk. Acute PT to con't to follow.      If plan is discharge home, recommend the following: A little help with walking and/or transfers;A little help with bathing/dressing/bathroom;Help with stairs or ramp for entrance;Assist for transportation   Can travel by private vehicle        Equipment Recommendations None recommended by PT  Recommendations for Other Services       Functional Status Assessment Patient has had a recent decline in their functional status and demonstrates the ability to make significant improvements in function in a reasonable and predictable amount of time.     Precautions / Restrictions Precautions Precautions: Fall Precaution Comments: soft BP, reports vision impairments from progressive diabetes      Mobility  Bed Mobility Overal bed mobility: Needs Assistance Bed Mobility: Supine to Sit     Supine to sit: Contact guard     General bed mobility comments: contact guard for safety/lines    Transfers Overall transfer level: Needs assistance Equipment used: None Transfers: Sit to/from Stand Sit to Stand: Contact guard  assist           General transfer comment: contact guard for safety as pt reports sometimes she feels "whoozy" getting up    Ambulation/Gait Ambulation/Gait assistance: Min assist Gait Distance (Feet): 30 Feet Assistive device: 1 person hand held assist Gait Pattern/deviations: Step-through pattern, Decreased stride length Gait velocity: dec Gait velocity interpretation: <1.31 ft/sec, indicative of household ambulator   General Gait Details: decreased step height and length, slow, guarded, pt stating she is a little whoozy, BP stable at 100s/70s, SpO2 > 91% on 3lo2 via [place] and HR at 120bpm  Stairs            Wheelchair Mobility     Tilt Bed    Modified Rankin (Stroke Patients Only)       Balance Overall balance assessment: Mild deficits observed, not formally tested                                           Pertinent Vitals/Pain Pain Assessment Pain Assessment: 0-10 Pain Score: 8  Pain Location: low back, chest, and L hip Pain Descriptors / Indicators: Aching Pain Intervention(s): Premedicated before session (given IV dilaudid)    Home Living Family/patient expects to be discharged to:: Private residence Living Arrangements: Non-relatives/Friends Available Help at Discharge: Friend(s);Available PRN/intermittently (friend works, however friends spouse is home but he has Alzheimers and pt reports "I know it's like the blind leading the blind.") Type of Home: House Home Access: Stairs to enter Entrance Stairs-Rails: Right Entrance Stairs-Number  of Steps: 2   Home Layout: One level Home Equipment: None Additional Comments: pt reports staying with her friend since July, prior to that pt was homeless    Prior Function Prior Level of Function : Needs assist             Mobility Comments: indep without AD, however doesn't drive due to impaired vision, doesn't work, and provides assist to her friends husband ADLs Comments: indep      Extremity/Trunk Assessment   Upper Extremity Assessment Upper Extremity Assessment: Generalized weakness    Lower Extremity Assessment Lower Extremity Assessment: Generalized weakness    Cervical / Trunk Assessment Cervical / Trunk Assessment: Normal  Communication   Communication Communication: No apparent difficulties  Cognition Arousal: Alert Behavior During Therapy: Lability (tangential) Overall Cognitive Status: No family/caregiver present to determine baseline cognitive functioning                                 General Comments: pt oriented x4 however presents with decreased insight to safety and deficits however suspect this to be baseline.        General Comments General comments (skin integrity, edema, etc.): pt requiring 3LO2 via [place] at this time.    Exercises     Assessment/Plan    PT Assessment Patient needs continued PT services  PT Problem List Decreased strength;Decreased activity tolerance;Decreased mobility;Decreased cognition;Cardiopulmonary status limiting activity       PT Treatment Interventions Gait training;Stair training;DME instruction;Functional mobility training;Balance training;Therapeutic exercise;Therapeutic activities    PT Goals (Current goals can be found in the Care Plan section)  Acute Rehab PT Goals Patient Stated Goal: home PT Goal Formulation: With patient Time For Goal Achievement: 07/22/23 Potential to Achieve Goals: Good    Frequency Min 1X/week     Co-evaluation               AM-PAC PT "6 Clicks" Mobility  Outcome Measure Help needed turning from your back to your side while in a flat bed without using bedrails?: A Little Help needed moving from lying on your back to sitting on the side of a flat bed without using bedrails?: A Little Help needed moving to and from a bed to a chair (including a wheelchair)?: A Little Help needed standing up from a chair using your arms (e.g., wheelchair or  bedside chair)?: A Little Help needed to walk in hospital room?: A Little Help needed climbing 3-5 steps with a railing? : A Lot 6 Click Score: 17    End of Session Equipment Utilized During Treatment: Gait belt;Oxygen (3Lo2 via [place]) Activity Tolerance: Patient tolerated treatment well Patient left: in chair;with call bell/phone within reach;with chair alarm set Nurse Communication: Mobility status PT Visit Diagnosis: Unsteadiness on feet (R26.81);Muscle weakness (generalized) (M62.81)    Time: 1191-4782 PT Time Calculation (min) (ACUTE ONLY): 26 min   Charges:   PT Evaluation $PT Eval Moderate Complexity: 1 Mod PT Treatments $Gait Training: 8-22 mins PT General Charges $$ ACUTE PT VISIT: 1 Visit         Lewis Shock, PT, DPT Acute Rehabilitation Services Secure chat preferred Office #: [phone]   Iona Hansen 07/08/2023, 10:29 AM

## 2023-07-08 NOTE — Progress Notes (Signed)
TR[facility] night cross cover note:   I was notified by RN that the patient continues to complain of nausea following prn dose of Zofran, with the patient requesting that the Zofran be switched to Phenergan.  She conveys that historically, endocrine is more effective than Zofran in controlling her nausea.  Additionally, the patient request resumption of home Ambien.  I socially discontinued Zofran and started prn for Phenergan.  I also resumed home Ambien with order for Ambien 5 mg p.o. nightly as needed for sleep.     Newton Pigg, DO Hospitalist

## 2023-07-08 NOTE — Progress Notes (Signed)
Pharmacist Heart Failure Core Measure Documentation  Assessment: [name] [name] has an EF documented as 20-25 on 05/25/2023 by Echo.  Rationale: Heart failure patients with left ventricular systolic dysfunction (LVSD) and an EF < 40% should be prescribed an angiotensin converting enzyme inhibitor (ACEI) or angiotensin receptor blocker (ARB) at discharge unless a contraindication is documented in the medical record.  This patient is not currently on an ACEI or ARB for HF.  This note is being placed in the record in order to provide documentation that a contraindication to the use of these agents is present for this encounter.  ACE Inhibitor or Angiotensin Receptor Blocker is contraindicated (specify all that apply)  []   ACEI allergy AND ARB allergy []   Angioedema []   Moderate or severe aortic stenosis []   Hyperkalemia [x]   Hypotension []   Renal artery stenosis [x]   Worsening renal function, preexisting renal disease or dysfunction  Harland German, PharmD Clinical Pharmacist **Pharmacist phone directory can now be found on [email or web address] ([email or web address]).  Listed under [facility].

## 2023-07-08 NOTE — Progress Notes (Signed)
Progress Note   Patient: [name] [name] YQM:[id number] DOB: 11-19-1970 DOA: 07/05/2023     3 DOS: the patient was seen and examined on 07/08/2023   Brief hospital course: Mrs. Brittingham was admitted to the hospital with the working diagnosis of who presented heart failure exacerbation in the setting of asthma exacerbation.   53 yo female with the past medical history of COPD, Asthma, T2DM, depression, bipolar disorder, heart failure and chronic pain syndrome who presented with chest pain, cough, and wheezing. Persistent and worsening symptoms for 7 days, reported PND, orthopnea and lower extremity edema. Because severe symptoms she called EMS. She was found with 02 saturation 88% on room air, she was placed on supplemental 02 and was transported to the ED.  On her initial physical examination her blood pressure was 126/88, HR 99, RR 27 and 02 saturation 91%, lungs with decreased breath sounds, positive rales and wheezing, prolonged expiratory phase, heart with S1 and S2 present and regular with no gallops, rubs or murmurs, abdomen with no distention, no lower extremity edema.   Chest radiograph with cardiomegaly, mild hilar vascular congestion with no infiltrates or effusions.   EKG 105 bpm, right axis deviation, qtc 511, sinus rhythm with left atrial enlargement, no significant ST segment or T wave changes.   Patient was placed on IV diuresis.  01/26 volume status has improved, but continue to have wheezing and clinical signs of air trapping.  Placed on bronchodilator therapy and systemic corticosteroids.  01/27 improvement in wheezing with steroids. Positive hyperglycemia.  01/28 improved air movement and less wheezing,   Assessment and Plan: * Acute on chronic systolic CHF (congestive heart failure) (HCC) Echocardiogram with EF 20 to 25%, with global hypokinesis, dilatation of LV cavity, RV systolic function preserved, LA dilatated, severe mitral regurgitation, trivial pericardial effusion.    Clinically with improvement in volume status.   resume bumetanide today Continue spironolactone,  Blood pressure support with midodrine. Limited pharmacologic therapy due to risk of hypotension.   CKD stage 3a, GFR 45-59 ml/min (HCC) AKI Hypokalemia.   Serum cr is 1,76 with K at 4,8 and serum bicarbonate at 27  Na 133   Resume loop diuretic today  Check renal function and electrolytes in am.   DM2 (diabetes mellitus, type 2) (HCC) Uncontrolled hyperglycemia. Steroid induced hyperglycemia.   Continue glucose cover and monitoring with insulin sliding scale.  Add basal insulin 10 units bid  Close capillary glucose monitoring    HTN (hypertension) Continue blood pressure support with midodrine.   COPD (chronic obstructive pulmonary disease) (HCC) Acute asthma / COPD exacerbation.   Plan to continue bronchodilator therapy. Continue with inhaled and systemic corticosteroids (decreased methylprednisolone to 40 mg daily)   Airway clearing techniques with flutter valve and incentive spirometer.   Chronic pain syndrome Continue with oxycodone for pain control.  For severe pain added hydromorphone as needed.  Continue sertraline,   Bipolar 1 disorder (HCC) Continue with sertraline,         Subjective: Patient with improved dyspnea no edema or chest pain, she has been ambulating, this morning she is out of bed to the chair   Physical Exam: Vitals:   07/07/23 2315 07/08/23 0106 07/08/23 0239 07/08/23 0741  BP: 105/64  105/70 101/64  Pulse: 81  93   Resp: 14  18 19   Temp: 97.7 F (36.5 C)  97.7 F (36.5 C) 97.7 F (36.5 C)  TempSrc: Oral  Oral Oral  SpO2: 92% 96% 92% 97%  Weight:  72.6 kg   Height:       Neurology awake and alert ENT with mild pallor Cardiovascular with S1 and S2 present and regular with no gallops, rubs or murmurs No JVD No lower extremity edema Respiratory with improved air  movement and decreased wheezing, scattered rhonchi with no rales   Abdomen with no distention  Data Reviewed:    Family Communication: no family at the bedside   Disposition: Status is: Inpatient Remains inpatient appropriate because: recovering respiratory status   Planned Discharge Destination: Home      Author: Coralie Keens, MD 07/08/2023 10:51 AM  For on call review [email or web address].

## 2023-07-09 DIAGNOSIS — I5023 Acute on chronic systolic (congestive) heart failure: Secondary | ICD-10-CM | POA: Diagnosis not present

## 2023-07-09 LAB — GLUCOSE, CAPILLARY
Glucose-Capillary: 331 mg/dL — ABNORMAL HIGH (ref 70–99)
Glucose-Capillary: 238 mg/dL — ABNORMAL HIGH (ref 70–99)
Glucose-Capillary: 307 mg/dL — ABNORMAL HIGH (ref 70–99)
Glucose-Capillary: 330 mg/dL — ABNORMAL HIGH (ref 70–99)

## 2023-07-09 LAB — BASIC METABOLIC PANEL
Anion gap: 14 (ref 5–15)
BUN: 60 mg/dL — ABNORMAL HIGH (ref 6–20)
CO2: 24 mmol/L (ref 22–32)
Calcium: 8.3 mg/dL — ABNORMAL LOW (ref 8.9–10.3)
Chloride: 94 mmol/L — ABNORMAL LOW (ref 98–111)
Creatinine, Ser: 1.56 mg/dL — ABNORMAL HIGH (ref 0.44–1.00)
GFR, Estimated: 39 mL/min — ABNORMAL LOW (ref >60)
Glucose, Bld: 332 mg/dL — ABNORMAL HIGH (ref 70–99)
Potassium: 4.6 mmol/L (ref 3.5–5.1)
Sodium: 132 mmol/L — ABNORMAL LOW (ref 135–145)

## 2023-07-09 MED ORDER — MAGNESIUM HYDROXIDE 400 MG/5ML PO SUSP
30.0000 mL | Freq: Every day | ORAL | Status: DC | PRN
  Administered 2023-07-09: 30 mL via ORAL
  Filled 2023-07-09: qty 30

## 2023-07-09 MED ORDER — INSULIN ASPART 100 UNIT/ML IJ SOLN
6.0000 [IU] | Freq: Three times a day (TID) | INTRAMUSCULAR | Status: DC
  Administered 2023-07-09 – 2023-07-10 (×5): 6 [IU] via SUBCUTANEOUS

## 2023-07-09 MED ORDER — PREDNISONE 20 MG PO TABS
40.0000 mg | ORAL_TABLET | Freq: Every day | ORAL | Status: DC
  Administered 2023-07-09 – 2023-07-10 (×2): 40 mg via ORAL
  Filled 2023-07-09 (×2): qty 2

## 2023-07-09 MED ORDER — ALPRAZOLAM 0.5 MG PO TABS
0.5000 mg | ORAL_TABLET | Freq: Once | ORAL | Status: AC | PRN
  Administered 2023-07-09: 0.5 mg via ORAL
  Filled 2023-07-09: qty 1

## 2023-07-09 MED ORDER — SENNOSIDES-DOCUSATE SODIUM 8.6-50 MG PO TABS
1.0000 | ORAL_TABLET | Freq: Two times a day (BID) | ORAL | Status: DC
  Administered 2023-07-09 – 2023-07-10 (×3): 1 via ORAL
  Filled 2023-07-09 (×3): qty 1

## 2023-07-09 MED ORDER — POLYETHYLENE GLYCOL 3350 17 G PO PACK
17.0000 g | PACK | Freq: Every day | ORAL | Status: DC
  Administered 2023-07-09: 17 g via ORAL
  Filled 2023-07-09: qty 1

## 2023-07-09 MED ORDER — ALPRAZOLAM 0.25 MG PO TABS
0.2500 mg | ORAL_TABLET | Freq: Once | ORAL | Status: AC | PRN
  Administered 2023-07-09: 0.25 mg via ORAL
  Filled 2023-07-09: qty 1

## 2023-07-09 MED ORDER — MIDODRINE HCL 5 MG PO TABS
5.0000 mg | ORAL_TABLET | Freq: Two times a day (BID) | ORAL | Status: DC
  Administered 2023-07-09 – 2023-07-10 (×2): 5 mg via ORAL
  Filled 2023-07-09 (×2): qty 1

## 2023-07-09 MED ORDER — OXYCODONE HCL 5 MG PO TABS
15.0000 mg | ORAL_TABLET | Freq: Four times a day (QID) | ORAL | Status: DC | PRN
  Administered 2023-07-09 – 2023-07-10 (×6): 15 mg via ORAL
  Filled 2023-07-09 (×6): qty 3

## 2023-07-09 MED ORDER — INSULIN GLARGINE-YFGN 100 UNIT/ML ~~LOC~~ SOLN
20.0000 [IU] | Freq: Two times a day (BID) | SUBCUTANEOUS | Status: DC
  Administered 2023-07-09 (×2): 20 [IU] via SUBCUTANEOUS
  Filled 2023-07-09 (×4): qty 0.2

## 2023-07-09 NOTE — Progress Notes (Addendum)
Patient has been in her room during this shift eating peanut butter cups that she brought from her home. This was unknown to staff.   At the same times, she has been asking different staff members for peanut butter and graham crackers. When one staff member explains she can not have anymore, the patient will ask a different staff member without mention that she has already had some.  Of note, her blood sugar has been running high during her stay and insulin has been being adjusted. Last evening blood sugar was 200.

## 2023-07-09 NOTE — TOC Progression Note (Signed)
Transition of Care [facility]) - Progression Note    Patient Details  Name: [name] [name] MRN: [id number] Date of Birth: July 06, 1970  [facility] CM/SW Contact  Harriet Masson, RN Phone Number: 07/09/2023, 4:05 PM  Clinical Narrative:    Patient declines home health at this time.   Expected Discharge Plan: Home/Self Care Barriers to Discharge: Continued Medical Work up  Expected Discharge Plan and Services   Discharge Planning Services: CM Consult   Living arrangements for the past 2 months: Apartment                                       Social Determinants of Health (SDOH) Interventions SDOH Screenings   Food Insecurity: No Food Insecurity (07/06/2023)  Recent Concern: Food Insecurity - Food Insecurity Present (07/06/2023)  Housing: Low Risk  (07/06/2023)  Transportation Needs: No Transportation Needs (07/06/2023)  Utilities: Not At Risk (07/06/2023)  Alcohol Screen: Low Risk  (02/12/2023)  Depression (PHQ2-9): High Risk (09/30/2022)  Financial Resource Strain: High Risk (09/30/2022)  Social Connections: Unknown (07/06/2023)  Stress: No Stress Concern Present (01/07/2022)   [facility] from [facility]  Tobacco Use: High Risk (07/05/2023)    Readmission Risk Interventions    02/12/2023    5:00 PM 09/02/2022    2:41 PM 08/28/2022    3:52 PM  Readmission Risk Prevention Plan  Transportation Screening Complete Complete Complete  Medication Review Oceanographer)  Complete Complete  PCP or Specialist appointment within 3-5 days of discharge  Complete Complete  HRI or Home Care Consult  Complete Complete  SW Recovery Care/Counseling Consult  --   Palliative Care Screening  Not Applicable Not Applicable  Skilled Nursing Facility  Not Applicable Not Applicable

## 2023-07-09 NOTE — Progress Notes (Signed)
Patient is asking for something to "calm her nerves." She says that at home she takes Xanax sometimes. Dr. Arlean Hopping was paged to see if she could have a one-time order of Xanax or something similar. See orders.  Of note, patient has been using her call bell quite often for small requests and has also been displaying manipulative behaviors with staff.

## 2023-07-09 NOTE — Progress Notes (Signed)
Nutrition Education Note  RD consulted for nutrition education regarding diabetes and heart health.   Lipid Panel     Component Value Date/Time   CHOL 196 02/14/2023 0618   TRIG 77 02/14/2023 0618   HDL 47 02/14/2023 0618   CHOLHDL 4.2 02/14/2023 0618   VLDL 15 02/14/2023 0618   LDLCALC 134 (H) 02/14/2023 0618   Lab Results  Component Value Date   HGBA1C 8.2 (H) 05/22/2023    RD provided "Heart Healthy Nutrition Therapy" handout from the Academy of Nutrition and Dietetics. Reviewed patient's dietary recall. Provided examples on ways to decrease sodium and fat intake in diet. Discouraged intake of processed foods and use of salt shaker. Encouraged fresh fruits and vegetables as well as whole grain sources of carbohydrates to maximize fiber intake and aid in blood sugar management.  RD  also provided "Carbohydrate Counting for People with Diabetes" handout from the Academy of Nutrition and Dietetics. Discussed different food groups and their effects on blood sugar, emphasizing carbohydrate-containing foods and the inclusion of HBV protein intake to stabilize blood sugars.  Discussed importance of controlled and consistent carbohydrate intake throughout the day. Provided examples of ways to balance meals/snacks and encouraged intake of high-fiber, whole grain complex carbohydrates.   Discussed plate method technique to balance multiple disease states and their associated recommendations.   Expect questionable compliance. Patient difficult to engage and with varying levels of lucidity during interaction.   Body mass index is 27 kg/m. Pt meets criteria for overweight based on current BMI.  Current diet order is HH/CHO modified, and patient is consuming approximately 100% x 6 documented meals at this time. Adequate weight to frame observed with no significant wt changes endorsed. Labs and medications reviewed. No further nutrition interventions warranted at this time. RD contact information  provided. If additional nutrition issues arise, please re-consult RD.  Myrtie Cruise MS, RD, LDN Registered Dietitian Clinical Nutrition RD Inpatient Contact Info in Amion

## 2023-07-09 NOTE — Plan of Care (Signed)
  Problem: Education: Goal: Knowledge of General Education information will improve Description: Including pain rating scale, medication(s)/side effects and non-pharmacologic comfort measures Outcome: Progressing   Problem: Clinical Measurements: Goal: Respiratory complications will improve Outcome: Progressing Goal: Cardiovascular complication will be avoided Outcome: Progressing   Problem: Activity: Goal: Risk for activity intolerance will decrease Outcome: Progressing   Problem: Pain Managment: Goal: General experience of comfort will improve and/or be controlled Outcome: Progressing

## 2023-07-09 NOTE — Progress Notes (Signed)
TRH night cross cover note:   I was notified by RN that the patient still having difficulty sleeping, and is reporting anxiety.  The patient reports that she takes Xanax at home at times for similar anxiety and that it is typically helpful for similar anxiety.   I subsequently ordered Xanax 0.5 mg p.o. x 1 dose prn for anxiety.     Newton Pigg, DO Hospitalist

## 2023-07-09 NOTE — Plan of Care (Signed)

## 2023-07-10 ENCOUNTER — Other Ambulatory Visit (HOSPITAL_COMMUNITY): Payer: Self-pay

## 2023-07-10 DIAGNOSIS — I5023 Acute on chronic systolic (congestive) heart failure: Secondary | ICD-10-CM | POA: Diagnosis not present

## 2023-07-10 LAB — BASIC METABOLIC PANEL
Anion gap: 9 (ref 5–15)
BUN: 48 mg/dL — ABNORMAL HIGH (ref 6–20)
CO2: 34 mmol/L — ABNORMAL HIGH (ref 22–32)
Calcium: 7.7 mg/dL — ABNORMAL LOW (ref 8.9–10.3)
Chloride: 93 mmol/L — ABNORMAL LOW (ref 98–111)
Creatinine, Ser: 1.9 mg/dL — ABNORMAL HIGH (ref 0.44–1.00)
GFR, Estimated: 31 mL/min — ABNORMAL LOW (ref >60)
Glucose, Bld: 319 mg/dL — ABNORMAL HIGH (ref 70–99)
Potassium: 4.3 mmol/L (ref 3.5–5.1)
Sodium: 136 mmol/L (ref 135–145)

## 2023-07-10 LAB — GLUCOSE, CAPILLARY
Glucose-Capillary: 196 mg/dL — ABNORMAL HIGH (ref 70–99)
Glucose-Capillary: 311 mg/dL — ABNORMAL HIGH (ref 70–99)

## 2023-07-10 MED ORDER — INSULIN GLARGINE-YFGN 100 UNIT/ML ~~LOC~~ SOLN
25.0000 [IU] | Freq: Two times a day (BID) | SUBCUTANEOUS | Status: DC
  Administered 2023-07-10: 25 [IU] via SUBCUTANEOUS
  Filled 2023-07-10 (×2): qty 0.25

## 2023-07-10 MED ORDER — SPIRONOLACTONE 25 MG PO TABS
12.5000 mg | ORAL_TABLET | Freq: Every day | ORAL | 0 refills | Status: DC
  Filled 2023-07-10: qty 15, 30d supply, fill #0

## 2023-07-10 MED ORDER — BUMETANIDE 1 MG PO TABS: 1.0000 mg | ORAL_TABLET | Freq: Every day | ORAL | Status: DC

## 2023-07-10 MED ORDER — TOUJEO SOLOSTAR 300 UNIT/ML ~~LOC~~ SOPN: 10.0000 [IU] | PEN_INJECTOR | Freq: Every day | SUBCUTANEOUS | Status: DC

## 2023-07-10 MED ORDER — PREDNISONE 20 MG PO TABS: 30.0000 mg | ORAL_TABLET | Freq: Every day | ORAL | Status: DC

## 2023-07-10 MED ORDER — ALPRAZOLAM 0.25 MG PO TABS
0.2500 mg | ORAL_TABLET | Freq: Once | ORAL | Status: AC
  Administered 2023-07-10: 0.25 mg via ORAL
  Filled 2023-07-10: qty 1

## 2023-07-10 MED ORDER — PREDNISONE 10 MG PO TABS
20.0000 mg | ORAL_TABLET | Freq: Every day | ORAL | 0 refills | Status: AC
  Filled 2023-07-10: qty 4, 2d supply, fill #0

## 2023-07-10 NOTE — Plan of Care (Signed)

## 2023-07-10 NOTE — Progress Notes (Signed)
Discharge instructions reviewed with pt.  Copy of instructions given to pt. [facility] has filled two scripts for pt, and will be picked up and delivered to pt. While reviewing instructions with pt, pt states she is out of her insulin and can't afford to have it filled. Pt was asked if she told her doctor or the unit case manager, pt states "no".  Unit RN messaged the MD and CM about her insulin, she will follow up on this.  Pt has requested assistance to get home, taxi voucher will be provided. Unit NS has had pt sign waiver form and has voucher when pt is ready to go. Pt is already dressed.    Thersea Manfredonia,RN SWOT

## 2023-07-10 NOTE — Discharge Summary (Signed)
Physician Discharge Summary  [name] [name]:[id number] DOB: 1970-11-01 DOA: 07/05/2023  [facility]  Admit date: 07/05/2023 Discharge date: 07/10/2023  Time spent: 45 minutes  Recommendations for Outpatient Follow-up:  Advanced heart failure clinic, requested close appointment in 7 to 10 days BMP in 1 week Home health RN Ambulatory referral to palliative care sent   Discharge Diagnoses:  Principal Problem:   Acute on chronic systolic CHF (congestive heart failure) (HCC) Active Problems:   CKD stage 3a, GFR 45-59 ml/min (HCC)   HTN (hypertension)   DM2 (diabetes mellitus, type 2) (HCC)   COPD (chronic obstructive pulmonary disease) (HCC)   Ongoing tobacco use   Chronic pain syndrome   Bipolar 1 disorder (HCC)   Discharge Condition: Improved  Diet recommendation: Low-sodium, diabetic  Filed Weights   07/09/23 0551 07/10/23 0400 07/10/23 0841  Weight: 73.6 kg 74.2 kg 74.6 kg    History of present illness:  53/F w COPD, Asthma, T2DM, depression, bipolar disorder, systolic CHF and chronic pain syndrome who presented with chest pain, cough, and wheezing. X 7 days, reported PND, orthopnea and lower extremity edema. In ED, Hypoxic, wheezing, Chest radiograph mild hilar vascular congestion  Patient was placed on IV diuresis. 1/26 volume status has improved, but persistent wheezing  Placed on bronchodilator therapy and systemic corticosteroids.    Hospital Course:  Acute on chronic systolic CHF  -Echo EF 20 to 25%, with global hypokinesis, dilatation of LV cavity, RV systolic function preserved, LA dilatated, severe mitral regurgitation, trivial pericardial effusion.  Diuresed with IV Lasix, volume status has improved, now switched to oral Bumex -Continue spironolactone, midodrine -GDMT limited by hypotension, Renal insufficiency -d/w CHF team, requested close FU in Clinic and resume Jardiance at FU -recommended HHRN at DC, she declined   CKD stage 3a,  GFR 45-59 ml/min (HCC) AKI Hypokalemia.  -Creatinine fluctuating in 1.5-2 range, Bumex resumed   DM2 (diabetes mellitus, type 2) (HCC) Uncontrolled hyperglycemia. Steroid induced hyperglycemia.  -Increased basal insulin and meal coverage   HTN (hypertension) Continue midodrine.    COPD (chronic obstructive pulmonary disease) (HCC) Acute asthma / COPD exacerbation.  -continue bronchodilator therapy. -Changed IV steroids to prednisone taper, no further wheezing -weaned off O2   Chronic pain syndrome Continue percocet, home meds  Discontinue Dilaudid   Bipolar 1 disorder (HCC) Continue sertraline,   Discharge Exam: Vitals:   07/10/23 0841 07/10/23 1104  BP:  126/87  Pulse: 90 99  Resp: 18 19  Temp:  98.3 F (36.8 C)  SpO2: 93% 96%   Gen: Awake, Alert, Oriented X 3,  HEENT: no JVD Lungs: poor air movement CVS: S1S2/RRR Abd: soft, Non tender, non distended, BS present Extremities: No edema Skin: no new rashes on exposed skin   Discharge Instructions   Discharge Instructions     Diet - low sodium heart healthy   Complete by: As directed    Increase activity slowly   Complete by: As directed       Allergies as of 07/10/2023       Reactions   Tramadol Nausea Only, Rash   Wellbutrin [bupropion] Other (See Comments)   Behavioral changes (aggression, irritability)   Benadryl [diphenhydramine] Other (See Comments)   Decreases motor skills and increases anxiety   Lactose Intolerance (gi) Diarrhea   Nicotine Rash   Patient states she is allergic to the Nicotine patch and they cause her to break out in a rash.  She states she can smoke cigarettes.  Medication List     TAKE these medications    bumetanide 1 MG tablet Commonly known as: BUMEX Take 1 tablet (1 mg total) by mouth daily.   Dulera 200-5 MCG/ACT Aero Generic drug: mometasone-formoterol Inhale 2 puffs into the lungs 2 (two) times daily.   Insupen Pen Needles 32G X 4 MM Misc Generic  drug: Insulin Pen Needle Use As Directed   Insulin Pen Needle 32G X 4 MM Misc Use 3 (three) times daily.   ipratropium-albuterol 0.5-2.5 (3) MG/3ML Soln Commonly known as: DUONEB inhale 3 mLs by nebulization every 6 (six) hours as needed. What changed: reasons to take this   Lurasidone HCl 60 MG Tabs Take 1 tablet (60 mg total) by mouth daily with supper.   midodrine 10 MG tablet Commonly known as: PROAMATINE Take 1 tablet (10 mg total) by mouth 3 (three) times daily with meals.   OneTouch Delica Lancets 33G Misc Use 3 (three) times daily as directed to check blood sugar   OneTouch Delica Plus Lancing Misc Use 3 (three) times daily.   OneTouch Verio test strip Generic drug: glucose blood Use three times daily as directed to check blood sugar   oxyCODONE-acetaminophen 10-325 MG tablet Commonly known as: PERCOCET Take 1 tablet by mouth every 6 (six) hours as needed for pain.   predniSONE 10 MG tablet Commonly known as: DELTASONE Take 2 tablets (20 mg total) by mouth daily with breakfast for 2 days. Start taking on: July 11, 2023   rosuvastatin 20 MG tablet Commonly known as: CRESTOR Take 1 tablet (20 mg total) by mouth daily.   sertraline 100 MG tablet Commonly known as: ZOLOFT Take 1 tablet (100 mg total) by mouth daily.   spironolactone 25 MG tablet Commonly known as: ALDACTONE Take 0.5 tablets (12.5 mg total) by mouth daily. What changed: how much to take   Toujeo SoloStar 300 UNIT/ML Solostar Pen Generic drug: insulin glargine (1 Unit Dial) Inject 10 Units into the skin at bedtime. Take 15 units daily for 2 days while on prednisone and then go back to your home dose of 10 units daily at bedtime What changed: additional instructions   Ventolin HFA 108 (90 Base) MCG/ACT inhaler Generic drug: albuterol Inhale 2 puffs into the lungs every 6 (six) hours as needed for wheezing or shortness of breath.       Allergies  Allergen Reactions   Tramadol Nausea  Only and Rash   Wellbutrin [Bupropion] Other (See Comments)    Behavioral changes (aggression, irritability)   Benadryl [Diphenhydramine] Other (See Comments)    Decreases motor skills and increases anxiety   Lactose Intolerance (Gi) Diarrhea   Nicotine Rash    Patient states she is allergic to the Nicotine patch and they cause her to break out in a rash.  She states she can smoke cigarettes.    Follow-up Information     [facility] Follow up.          Sabharwal, Aditya, DO. Go in 1 week(s).   Specialty: Cardiology Contact information: [address] [place] [address] [phone]                  The results of significant diagnostics from this hospitalization (including imaging, microbiology, ancillary and laboratory) are listed below for reference.    Significant Diagnostic Studies: DG Chest Port 1 View Result Date: 07/05/2023 CLINICAL DATA:  Shortness of breath with wheezing EXAM: PORTABLE CHEST 1 VIEW COMPARISON:  Chest x-ray 05/22/2023 FINDINGS: Heart is moderately  enlarged, unchanged. Lungs are clear. There is no pleural effusion or pneumothorax. No acute fractures are seen. IMPRESSION: Moderate cardiomegaly. No acute cardiopulmonary process. Electronically Signed   By: Darliss Cheney M.D.   On: 07/05/2023 19:15    Microbiology: Recent Results (from the past 240 hours)  Resp panel by RT-PCR (RSV, Flu A&B, Covid) Anterior Nasal Swab     Status: None   Collection Time: 07/05/23  4:52 PM   Specimen: Anterior Nasal Swab  Result Value Ref Range Status   SARS Coronavirus 2 by RT PCR NEGATIVE NEGATIVE Final   Influenza A by PCR NEGATIVE NEGATIVE Final   Influenza B by PCR NEGATIVE NEGATIVE Final    Comment: (NOTE) The Xpert Xpress SARS-CoV-2/FLU/RSV plus assay is intended as an aid in the diagnosis of influenza from Nasopharyngeal swab specimens and should not be used as a sole basis for treatment. Nasal washings and aspirates are unacceptable for Xpert Xpress  SARS-CoV-2/FLU/RSV testing.  Fact Sheet for Patients: BloggerCourse.com  Fact Sheet for Healthcare Providers: SeriousBroker.it  This test is not yet approved or cleared by the [place] FDA and has been authorized for detection and/or diagnosis of SARS-CoV-2 by FDA under an Emergency Use Authorization (EUA). This EUA will remain in effect (meaning this test can be used) for the duration of the COVID-19 declaration under Section 564(b)(1) of the Act, 21 U.S.C. section 360bbb-3(b)(1), unless the authorization is terminated or revoked.     Resp Syncytial Virus by PCR NEGATIVE NEGATIVE Final    Comment: (NOTE) Fact Sheet for Patients: BloggerCourse.com  Fact Sheet for Healthcare Providers: SeriousBroker.it  This test is not yet approved or cleared by the [place] FDA and has been authorized for detection and/or diagnosis of SARS-CoV-2 by FDA under an Emergency Use Authorization (EUA). This EUA will remain in effect (meaning this test can be used) for the duration of the COVID-19 declaration under Section 564(b)(1) of the Act, 21 U.S.C. section 360bbb-3(b)(1), unless the authorization is terminated or revoked.  Performed at [facility], [address], [place], [place] [address]   MRSA Next Gen by PCR, Nasal     Status: None   Collection Time: 07/05/23 11:00 PM   Specimen: Nasal Mucosa; Nasal Swab  Result Value Ref Range Status   MRSA by PCR Next Gen NOT DETECTED NOT DETECTED Final    Comment: (NOTE) The GeneXpert MRSA Assay (FDA approved for NASAL specimens only), is one component of a comprehensive MRSA colonization surveillance program. It is not intended to diagnose MRSA infection nor to guide or monitor treatment for MRSA infections. Test performance is not FDA approved in patients less than 77 years old. Performed at [facility], [address], [place], [place] [address]      Labs: Basic Metabolic Panel: Recent Labs  Lab 07/05/23 1625 07/06/23 0011 07/06/23 0214 07/07/23 0212 07/08/23 0214 07/09/23 0221 07/10/23 0211  NA 138  --  137 132* 133* 132* 136  K 3.7  --  3.4* 4.8 4.8 4.6 4.3  CL 98  --  98 97* 95* 94* 93*  CO2 23  --  29 23 27 24  34*  GLUCOSE 494*  --  287* 397* 358* 332* 319*  BUN 22*  --  28* 40* 46* 60* 48*  CREATININE 1.32*   < > 1.40* 1.70* 1.76* 1.56* 1.90*  CALCIUM 8.7*  --  8.1* 8.1* 8.6* 8.3* 7.7*  MG 1.7  --   --  2.2  --   --   --    < > =  values in this interval not displayed.   Liver Function Tests: No results for input(s): "AST", "ALT", "ALKPHOS", "BILITOT", "PROT", "ALBUMIN" in the last 168 hours. No results for input(s): "LIPASE", "AMYLASE" in the last 168 hours. No results for input(s): "AMMONIA" in the last 168 hours. CBC: Recent Labs  Lab 07/05/23 1625 07/06/23 0011  WBC 7.1 6.3  NEUTROABS 5.9  --   HGB 11.5* 10.9*  HCT 35.7* 33.6*  MCV 89.5 89.4  PLT 183 181   Cardiac Enzymes: No results for input(s): "CKTOTAL", "CKMB", "CKMBINDEX", "TROPONINI" in the last 168 hours. BNP: BNP (last 3 results) Recent Labs    05/14/23 1549 05/22/23 1458 07/05/23 1625  BNP 723.5* 1,610.9* 3,720.3*    ProBNP (last 3 results) No results for input(s): "PROBNP" in the last 8760 hours.  CBG: Recent Labs  Lab 07/09/23 1110 07/09/23 1632 07/09/23 2111 07/10/23 0613 07/10/23 1107  GLUCAP 238* 307* 330* 196* 311*       Signed:  Zannie Cove MD.  [facility] 07/10/2023, 12:30 PM

## 2023-07-10 NOTE — Progress Notes (Signed)
Physical Therapy Treatment Patient Details Name: [name] [name] MRN: [id number] DOB: [birth year] Today's Date: 07/10/2023   History of Present Illness 53 yo female who presented with chest pain, cough, and wheezing on 07/05/23. Marland Kitchen PMH: COPD, Asthma, T2DM, depression, bipolar disorder, heart failure and chronic pain syndrome    PT Comments  Pt progressing well towards all goals. Pt able to ambulate on RA with SPO2 > 94%. Pt continues to report "whooziness" with ambulation however also reports increased ease and stability with ambulation when using rollator. Pt with self limiting behavior however suspect pt is at baseline level of function. Acute PT to cont to follow.    If plan is discharge home, recommend the following: A little help with walking and/or transfers;A little help with bathing/dressing/bathroom;Help with stairs or ramp for entrance;Assist for transportation   Can travel by private vehicle        Equipment Recommendations  None recommended by PT (pt declined rollator due to "the apartment is way to small for a walker")    Recommendations for Other Services       Precautions / Restrictions Precautions Precautions: Fall     Mobility  Bed Mobility Overal bed mobility: Needs Assistance Bed Mobility: Supine to Sit     Supine to sit: Supervision     General bed mobility comments: HOB elevated no difficulty    Transfers Overall transfer level: Needs assistance Equipment used: None Transfers: Sit to/from Stand Sit to Stand: Contact guard assist           General transfer comment: Pt reports feelin mildly whoozy upon standing but VSS    Ambulation/Gait Ambulation/Gait assistance: Contact guard assist Gait Distance (Feet): 150 Feet Assistive device: Rollator (4 wheels), None Gait Pattern/deviations: Step-through pattern, Decreased stride length Gait velocity: dec     General Gait Details: initially amb without AD however pt with c/o of "whooziness" pt with  short step height and length with noted instability but no LOB. Pt given rollator in which she reports relief and ease of walking demonstrating increased step height and length with more fluid gait pattern.   Stairs             Wheelchair Mobility     Tilt Bed    Modified Rankin (Stroke Patients Only)       Balance Overall balance assessment: Mild deficits observed, not formally tested                                          Cognition Arousal: Alert Behavior During Therapy: Lability (tangential) Overall Cognitive Status: No family/caregiver present to determine baseline cognitive functioning                                 General Comments: pt oriented x4, self limiting        Exercises      General Comments General comments (skin integrity, edema, etc.): SpO2 >94% on RA, VSS      Pertinent Vitals/Pain Pain Assessment Pain Assessment: 0-10 Pain Score: 8  Pain Location: headache Pain Descriptors / Indicators: Headache    Home Living                          Prior Function            PT  Goals (current goals can now be found in the care plan section) Acute Rehab PT Goals Patient Stated Goal: home PT Goal Formulation: With patient Time For Goal Achievement: 07/22/23 Potential to Achieve Goals: Good Progress towards PT goals: Progressing toward goals    Frequency    Min 1X/week      PT Plan      Co-evaluation              AM-PAC PT "6 Clicks" Mobility   Outcome Measure  Help needed turning from your back to your side while in a flat bed without using bedrails?: A Little Help needed moving from lying on your back to sitting on the side of a flat bed without using bedrails?: A Little Help needed moving to and from a bed to a chair (including a wheelchair)?: A Little Help needed standing up from a chair using your arms (e.g., wheelchair or bedside chair)?: A Little Help needed to walk in  hospital room?: A Little Help needed climbing 3-5 steps with a railing? : A Lot 6 Click Score: 17    End of Session Equipment Utilized During Treatment: Gait belt Activity Tolerance: Patient tolerated treatment well Patient left: with call bell/phone within reach;in bed;with bed alarm set Nurse Communication: Mobility status PT Visit Diagnosis: Unsteadiness on feet (R26.81);Muscle weakness (generalized) (M62.81)     Time: 1205-1221 PT Time Calculation (min) (ACUTE ONLY): 16 min  Charges:    $Gait Training: 8-22 mins PT General Charges $$ ACUTE PT VISIT: 1 Visit                     Lewis Shock, PT, DPT Acute Rehabilitation Services Secure chat preferred Office #: [phone]    Iona Hansen 07/10/2023, 3:12 PM

## 2023-07-10 NOTE — Progress Notes (Signed)
Reached out to CM about patient not being able to afford mediations. CM reaching out to patient. Messaged MD about giving patient pain medication early per patient request. MD approved to be given early. Patient then stated that she was not given her home medications being held in pharmacy. Confirmed with nurse manager who delivered medications to her that patient did receive them. Patient given TOC medications. Patient then confirmed, that she did get her home medications that we were holding in pharmacy for her. Patient transported by [facility] nurse to discharge lounge.

## 2023-07-10 NOTE — Inpatient Diabetes Management (Signed)
Inpatient Diabetes Program Recommendations  AACE/ADA: New Consensus Statement on Inpatient Glycemic Control (2015)  Target Ranges:  Prepandial:   less than 140 mg/dL      Peak postprandial:   less than 180 mg/dL (1-2 hours)      Critically ill patients:  140 - 180 mg/dL   Lab Results  Component Value Date   GLUCAP 196 (H) 07/10/2023   HGBA1C 8.2 (H) 05/22/2023    Review of Glycemic Control  Latest Reference Range & Units 07/09/23 16:32 07/09/23 21:11 07/10/23 06:13  Glucose-Capillary 70 - 99 mg/dL 161 (H) 096 (H) 045 (H)   Diabetes history: DM 2 Outpatient Diabetes medications:  Toujeo 10 units q HS Current orders for Inpatient glycemic control:  Novolog 0-15 units tid with meals and HS Novolog 6 units tid with meals Semglee 25 units bid Prednisone 40 mg daily Inpatient Diabetes Program Recommendations:    Fasting blood sugar improved this AM.  Will follow.   Thanks,  Lorenza Cambridge, RN, BC-ADM Inpatient Diabetes Coordinator Pager [phone]  (8a-5p)

## 2023-07-10 NOTE — TOC Transition Note (Addendum)
Transition of Care [facility]) - Discharge Note   Patient Details  Name: [name] [name] MRN: [id number] Date of Birth: [birth year]  [facility]) CM/SW Contact:  Harriet Masson, RN Phone Number: 07/10/2023, 1:22 PM   Clinical Narrative:    Patient stable for discharge.  Spoke to patient regarding home health and patient declines. Dr. Jomarie Longs is aware.  Patient states that she gets disability and medicaid.    Final next level of care: Home/Self Care Barriers to Discharge: Barriers Resolved   Patient Goals and CMS Choice Patient states their goals for this hospitalization and ongoing recovery are:: return home          Discharge Placement                       Discharge Plan and Services Additional resources added to the After Visit Summary for     Discharge Planning Services: CM Consult                                 Social Drivers of Health (SDOH) Interventions SDOH Screenings   Food Insecurity: No Food Insecurity (07/06/2023)  Recent Concern: Food Insecurity - Food Insecurity Present (07/06/2023)  Housing: Low Risk  (07/06/2023)  Transportation Needs: No Transportation Needs (07/06/2023)  Utilities: Not At Risk (07/06/2023)  Alcohol Screen: Low Risk  (02/12/2023)  Depression (PHQ2-9): High Risk (09/30/2022)  Financial Resource Strain: High Risk (09/30/2022)  Social Connections: Unknown (07/06/2023)  Stress: No Stress Concern Present (01/07/2022)   Received from [facility]  Tobacco Use: High Risk (07/05/2023)     Readmission Risk Interventions    07/10/2023    1:21 PM 02/12/2023    5:00 PM 09/02/2022    2:41 PM  Readmission Risk Prevention Plan  Transportation Screening Complete Complete Complete  Medication Review Oceanographer) Complete  Complete  PCP or Specialist appointment within 3-5 days of discharge Complete  Complete  HRI or Home Care Consult Complete  Complete  SW Recovery Care/Counseling Consult Patient refused   --  Palliative Care Screening Not Applicable  Not Applicable  Skilled Nursing Facility Not Applicable  Not Applicable

## 2023-07-17 ENCOUNTER — Telehealth (HOSPITAL_COMMUNITY): Payer: Self-pay

## 2023-07-17 NOTE — Telephone Encounter (Signed)
 Called to confirm/remind patient of their appointment at the [facility] on 07/18/2023 at 8:30.   Tried to reach patient. Patient voicemail not set up.

## 2023-07-18 ENCOUNTER — Encounter (HOSPITAL_COMMUNITY): Payer: Self-pay

## 2023-07-18 ENCOUNTER — Encounter (HOSPITAL_COMMUNITY): Payer: BLUE CROSS/BLUE SHIELD

## 2023-07-18 ENCOUNTER — Inpatient Hospital Stay (HOSPITAL_COMMUNITY)
Admission: EM | Admit: 2023-07-18 | Discharge: 2023-07-23 | DRG: 291 | Disposition: A | Discharge status: Hospice | Payer: MEDICAID | Attending: Family Medicine | Admitting: Family Medicine

## 2023-07-18 ENCOUNTER — Other Ambulatory Visit: Payer: Self-pay

## 2023-07-18 ENCOUNTER — Emergency Department (HOSPITAL_COMMUNITY): Payer: MEDICAID

## 2023-07-18 DIAGNOSIS — E785 Hyperlipidemia, unspecified: Secondary | ICD-10-CM | POA: Diagnosis present

## 2023-07-18 DIAGNOSIS — I2609 Other pulmonary embolism with acute cor pulmonale: Secondary | ICD-10-CM | POA: Diagnosis present

## 2023-07-18 DIAGNOSIS — I10 Essential (primary) hypertension: Secondary | ICD-10-CM | POA: Diagnosis present

## 2023-07-18 DIAGNOSIS — F259 Schizoaffective disorder, unspecified: Secondary | ICD-10-CM | POA: Diagnosis present

## 2023-07-18 DIAGNOSIS — I5043 Acute on chronic combined systolic (congestive) and diastolic (congestive) heart failure: Secondary | ICD-10-CM

## 2023-07-18 DIAGNOSIS — Z5986 Financial insecurity: Secondary | ICD-10-CM

## 2023-07-18 DIAGNOSIS — F17210 Nicotine dependence, cigarettes, uncomplicated: Secondary | ICD-10-CM | POA: Diagnosis present

## 2023-07-18 DIAGNOSIS — Z794 Long term (current) use of insulin: Secondary | ICD-10-CM | POA: Diagnosis not present

## 2023-07-18 DIAGNOSIS — I472 Ventricular tachycardia, unspecified: Secondary | ICD-10-CM | POA: Diagnosis not present

## 2023-07-18 DIAGNOSIS — J449 Chronic obstructive pulmonary disease, unspecified: Secondary | ICD-10-CM | POA: Diagnosis present

## 2023-07-18 DIAGNOSIS — E739 Lactose intolerance, unspecified: Secondary | ICD-10-CM | POA: Diagnosis present

## 2023-07-18 DIAGNOSIS — J4489 Other specified chronic obstructive pulmonary disease: Secondary | ICD-10-CM | POA: Diagnosis present

## 2023-07-18 DIAGNOSIS — R0600 Dyspnea, unspecified: Secondary | ICD-10-CM

## 2023-07-18 DIAGNOSIS — N1831 Chronic kidney disease, stage 3a: Secondary | ICD-10-CM | POA: Diagnosis present

## 2023-07-18 DIAGNOSIS — Z79899 Other long term (current) drug therapy: Secondary | ICD-10-CM

## 2023-07-18 DIAGNOSIS — E119 Type 2 diabetes mellitus without complications: Secondary | ICD-10-CM

## 2023-07-18 DIAGNOSIS — I130 Hypertensive heart and chronic kidney disease with heart failure and stage 1 through stage 4 chronic kidney disease, or unspecified chronic kidney disease: Principal | ICD-10-CM | POA: Diagnosis present

## 2023-07-18 DIAGNOSIS — Z818 Family history of other mental and behavioral disorders: Secondary | ICD-10-CM

## 2023-07-18 DIAGNOSIS — E1101 Type 2 diabetes mellitus with hyperosmolarity with coma: Secondary | ICD-10-CM | POA: Diagnosis not present

## 2023-07-18 DIAGNOSIS — I428 Other cardiomyopathies: Secondary | ICD-10-CM | POA: Diagnosis present

## 2023-07-18 DIAGNOSIS — R0601 Orthopnea: Secondary | ICD-10-CM

## 2023-07-18 DIAGNOSIS — I5023 Acute on chronic systolic (congestive) heart failure: Principal | ICD-10-CM | POA: Diagnosis present

## 2023-07-18 DIAGNOSIS — J9601 Acute respiratory failure with hypoxia: Secondary | ICD-10-CM | POA: Diagnosis present

## 2023-07-18 DIAGNOSIS — Z7984 Long term (current) use of oral hypoglycemic drugs: Secondary | ICD-10-CM

## 2023-07-18 DIAGNOSIS — N179 Acute kidney failure, unspecified: Secondary | ICD-10-CM | POA: Diagnosis not present

## 2023-07-18 DIAGNOSIS — Z885 Allergy status to narcotic agent status: Secondary | ICD-10-CM

## 2023-07-18 DIAGNOSIS — Z66 Do not resuscitate: Secondary | ICD-10-CM | POA: Diagnosis present

## 2023-07-18 DIAGNOSIS — Z7189 Other specified counseling: Secondary | ICD-10-CM

## 2023-07-18 DIAGNOSIS — I272 Pulmonary hypertension, unspecified: Secondary | ICD-10-CM | POA: Diagnosis present

## 2023-07-18 DIAGNOSIS — Z765 Malingerer [conscious simulation]: Secondary | ICD-10-CM

## 2023-07-18 DIAGNOSIS — E1165 Type 2 diabetes mellitus with hyperglycemia: Secondary | ICD-10-CM

## 2023-07-18 DIAGNOSIS — F319 Bipolar disorder, unspecified: Secondary | ICD-10-CM | POA: Diagnosis present

## 2023-07-18 DIAGNOSIS — Z8249 Family history of ischemic heart disease and other diseases of the circulatory system: Secondary | ICD-10-CM

## 2023-07-18 DIAGNOSIS — E871 Hypo-osmolality and hyponatremia: Secondary | ICD-10-CM | POA: Diagnosis present

## 2023-07-18 DIAGNOSIS — Z833 Family history of diabetes mellitus: Secondary | ICD-10-CM

## 2023-07-18 DIAGNOSIS — G894 Chronic pain syndrome: Secondary | ICD-10-CM | POA: Diagnosis present

## 2023-07-18 DIAGNOSIS — J42 Unspecified chronic bronchitis: Secondary | ICD-10-CM | POA: Diagnosis not present

## 2023-07-18 DIAGNOSIS — E1122 Type 2 diabetes mellitus with diabetic chronic kidney disease: Secondary | ICD-10-CM | POA: Diagnosis present

## 2023-07-18 DIAGNOSIS — Z7951 Long term (current) use of inhaled steroids: Secondary | ICD-10-CM

## 2023-07-18 DIAGNOSIS — E876 Hypokalemia: Secondary | ICD-10-CM | POA: Diagnosis not present

## 2023-07-18 DIAGNOSIS — I471 Supraventricular tachycardia, unspecified: Secondary | ICD-10-CM | POA: Diagnosis present

## 2023-07-18 DIAGNOSIS — Z5900 Homelessness unspecified: Secondary | ICD-10-CM

## 2023-07-18 DIAGNOSIS — R739 Hyperglycemia, unspecified: Secondary | ICD-10-CM

## 2023-07-18 DIAGNOSIS — J410 Simple chronic bronchitis: Secondary | ICD-10-CM

## 2023-07-18 DIAGNOSIS — F411 Generalized anxiety disorder: Secondary | ICD-10-CM | POA: Insufficient documentation

## 2023-07-18 DIAGNOSIS — I340 Nonrheumatic mitral (valve) insufficiency: Secondary | ICD-10-CM | POA: Diagnosis present

## 2023-07-18 DIAGNOSIS — I159 Secondary hypertension, unspecified: Secondary | ICD-10-CM | POA: Diagnosis not present

## 2023-07-18 DIAGNOSIS — Z888 Allergy status to other drugs, medicaments and biological substances status: Secondary | ICD-10-CM

## 2023-07-18 LAB — BASIC METABOLIC PANEL
Anion gap: 13 (ref 5–15)
BUN: 20 mg/dL (ref 6–20)
CO2: 19 mmol/L — ABNORMAL LOW (ref 22–32)
Calcium: 8.5 mg/dL — ABNORMAL LOW (ref 8.9–10.3)
Chloride: 101 mmol/L (ref 98–111)
Creatinine, Ser: 1 mg/dL (ref 0.44–1.00)
GFR, Estimated: >60 mL/min (ref >60)
Glucose, Bld: 648 mg/dL (ref 70–99)
Potassium: 4.4 mmol/L (ref 3.5–5.1)
Sodium: 133 mmol/L — ABNORMAL LOW (ref 135–145)

## 2023-07-18 LAB — BRAIN NATRIURETIC PEPTIDE: B Natriuretic Peptide: 4443.3 pg/mL — ABNORMAL HIGH (ref 0.0–100.0)

## 2023-07-18 LAB — I-STAT VENOUS BLOOD GAS, ED
Acid-base deficit: 2 mmol/L (ref 0.0–2.0)
Bicarbonate: 21.7 mmol/L (ref 20.0–28.0)
Calcium, Ion: 1.17 mmol/L (ref 1.15–1.40)
HCT: 40 % (ref 36.0–46.0)
Hemoglobin: 13.6 g/dL (ref 12.0–15.0)
O2 Saturation: 88 %
Potassium: 4.4 mmol/L (ref 3.5–5.1)
Sodium: 134 mmol/L — ABNORMAL LOW (ref 135–145)
TCO2: 23 mmol/L (ref 22–32)
pCO2, Ven: 32.9 mm[Hg] — ABNORMAL LOW (ref 44–60)
pH, Ven: 7.428 (ref 7.25–7.43)
pO2, Ven: 52 mm[Hg] — ABNORMAL HIGH (ref 32–45)

## 2023-07-18 LAB — CBC WITH DIFFERENTIAL/PLATELET
Abs Immature Granulocytes: 0.04 10*3/uL (ref 0.00–0.07)
Basophils Absolute: 0 10*3/uL (ref 0.0–0.1)
Basophils Relative: 0 %
Eosinophils Absolute: 0 10*3/uL (ref 0.0–0.5)
Eosinophils Relative: 0 %
HCT: 38.3 % (ref 36.0–46.0)
Hemoglobin: 12.1 g/dL (ref 12.0–15.0)
Immature Granulocytes: 1 %
Lymphocytes Relative: 6 %
Lymphs Abs: 0.4 10*3/uL — ABNORMAL LOW (ref 0.7–4.0)
MCH: 27.9 pg (ref 26.0–34.0)
MCHC: 31.6 g/dL (ref 30.0–36.0)
MCV: 88.2 fL (ref 80.0–100.0)
Monocytes Absolute: 0.3 10*3/uL (ref 0.1–1.0)
Monocytes Relative: 5 %
Neutro Abs: 6.5 10*3/uL (ref 1.7–7.7)
Neutrophils Relative %: 88 %
Platelets: 211 10*3/uL (ref 150–400)
RBC: 4.34 MIL/uL (ref 3.87–5.11)
RDW: 16.3 % — ABNORMAL HIGH (ref 11.5–15.5)
WBC: 7.3 10*3/uL (ref 4.0–10.5)
nRBC: 0.3 % — ABNORMAL HIGH (ref 0.0–0.2)

## 2023-07-18 LAB — CBG MONITORING, ED: Glucose-Capillary: 497 mg/dL — ABNORMAL HIGH (ref 70–99)

## 2023-07-18 LAB — RESP PANEL BY RT-PCR (RSV, FLU A&B, COVID)  RVPGX2
Influenza A by PCR: NEGATIVE
Influenza B by PCR: NEGATIVE
Resp Syncytial Virus by PCR: NEGATIVE
SARS Coronavirus 2 by RT PCR: NEGATIVE

## 2023-07-18 MED ORDER — HYDROMORPHONE HCL 1 MG/ML IJ SOLN
1.0000 mg | INTRAMUSCULAR | Status: DC | PRN
  Administered 2023-07-18 – 2023-07-23 (×26): 1 mg via INTRAVENOUS
  Filled 2023-07-18 (×26): qty 1

## 2023-07-18 MED ORDER — INSULIN ASPART 100 UNIT/ML IJ SOLN: 0.0000 [IU] | Freq: Every day | INTRAMUSCULAR | Status: DC

## 2023-07-18 MED ORDER — FUROSEMIDE 10 MG/ML IJ SOLN
80.0000 mg | Freq: Once | INTRAMUSCULAR | Status: AC
  Administered 2023-07-18: 80 mg via INTRAVENOUS
  Filled 2023-07-18: qty 8

## 2023-07-18 MED ORDER — EMPAGLIFLOZIN 10 MG PO TABS
10.0000 mg | ORAL_TABLET | Freq: Every day | ORAL | Status: DC
Start: 2023-07-18 — End: 2023-07-23
  Administered 2023-07-18 – 2023-07-23 (×6): 10 mg via ORAL
  Filled 2023-07-18 (×6): qty 1

## 2023-07-18 MED ORDER — KETOROLAC TROMETHAMINE 15 MG/ML IJ SOLN
15.0000 mg | Freq: Once | INTRAMUSCULAR | Status: AC
  Administered 2023-07-18: 15 mg via INTRAVENOUS
  Filled 2023-07-18: qty 1

## 2023-07-18 MED ORDER — ACETAMINOPHEN 650 MG RE SUPP: 650.0000 mg | Freq: Four times a day (QID) | RECTAL | Status: DC | PRN

## 2023-07-18 MED ORDER — ROSUVASTATIN CALCIUM 20 MG PO TABS
20.0000 mg | ORAL_TABLET | Freq: Every day | ORAL | Status: DC
  Administered 2023-07-19 – 2023-07-23 (×5): 20 mg via ORAL
  Filled 2023-07-18 (×5): qty 1

## 2023-07-18 MED ORDER — INSULIN ASPART 100 UNIT/ML IJ SOLN
10.0000 [IU] | Freq: Once | INTRAMUSCULAR | Status: AC
  Administered 2023-07-18: 10 [IU] via INTRAVENOUS

## 2023-07-18 MED ORDER — INSULIN GLARGINE-YFGN 100 UNIT/ML ~~LOC~~ SOLN
10.0000 [IU] | Freq: Every day | SUBCUTANEOUS | Status: DC
  Administered 2023-07-18 – 2023-07-21 (×4): 10 [IU] via SUBCUTANEOUS
  Filled 2023-07-18 (×5): qty 0.1

## 2023-07-18 MED ORDER — SPIRONOLACTONE 12.5 MG HALF TABLET
12.5000 mg | ORAL_TABLET | Freq: Every day | ORAL | Status: DC
  Administered 2023-07-19: 12.5 mg via ORAL
  Filled 2023-07-18: qty 1

## 2023-07-18 MED ORDER — SODIUM CHLORIDE 0.9 % IV SOLN: 250.0000 mL | INTRAVENOUS | Status: AC | PRN

## 2023-07-18 MED ORDER — OXYCODONE HCL 5 MG PO TABS: 5.0000 mg | ORAL_TABLET | Freq: Four times a day (QID) | ORAL | Status: DC | PRN

## 2023-07-18 MED ORDER — SODIUM CHLORIDE 0.9% FLUSH: 3.0000 mL | INTRAVENOUS | Status: DC | PRN

## 2023-07-18 MED ORDER — FUROSEMIDE 10 MG/ML IJ SOLN
60.0000 mg | Freq: Two times a day (BID) | INTRAMUSCULAR | Status: DC
  Administered 2023-07-19: 60 mg via INTRAVENOUS
  Filled 2023-07-18: qty 6

## 2023-07-18 MED ORDER — OXYCODONE-ACETAMINOPHEN 5-325 MG PO TABS: 1.0000 | ORAL_TABLET | Freq: Four times a day (QID) | ORAL | Status: DC | PRN

## 2023-07-18 MED ORDER — ENOXAPARIN SODIUM 40 MG/0.4ML IJ SOSY
40.0000 mg | PREFILLED_SYRINGE | INTRAMUSCULAR | Status: DC
  Administered 2023-07-19: 40 mg via SUBCUTANEOUS
  Filled 2023-07-18 (×5): qty 0.4

## 2023-07-18 MED ORDER — OXYCODONE-ACETAMINOPHEN 10-325 MG PO TABS: 1.0000 | ORAL_TABLET | Freq: Four times a day (QID) | ORAL | Status: DC | PRN

## 2023-07-18 MED ORDER — OXYCODONE HCL 5 MG PO TABS
10.0000 mg | ORAL_TABLET | Freq: Once | ORAL | Status: AC
  Administered 2023-07-18: 10 mg via ORAL
  Filled 2023-07-18: qty 2

## 2023-07-18 MED ORDER — SODIUM CHLORIDE 0.9% FLUSH
3.0000 mL | Freq: Two times a day (BID) | INTRAVENOUS | Status: DC
  Administered 2023-07-19 – 2023-07-23 (×6): 3 mL via INTRAVENOUS

## 2023-07-18 MED ORDER — IPRATROPIUM-ALBUTEROL 0.5-2.5 (3) MG/3ML IN SOLN
3.0000 mL | Freq: Four times a day (QID) | RESPIRATORY_TRACT | Status: DC | PRN
  Administered 2023-07-22: 3 mL via RESPIRATORY_TRACT
  Filled 2023-07-18: qty 3

## 2023-07-18 MED ORDER — ACETAMINOPHEN 325 MG PO TABS
650.0000 mg | ORAL_TABLET | Freq: Four times a day (QID) | ORAL | Status: DC | PRN
  Administered 2023-07-18: 650 mg via ORAL
  Filled 2023-07-18: qty 2

## 2023-07-18 MED ORDER — MIDODRINE HCL 5 MG PO TABS
10.0000 mg | ORAL_TABLET | Freq: Three times a day (TID) | ORAL | Status: DC
  Administered 2023-07-18 – 2023-07-23 (×15): 10 mg via ORAL
  Filled 2023-07-18 (×15): qty 2

## 2023-07-18 MED ORDER — PROCHLORPERAZINE EDISYLATE 10 MG/2ML IJ SOLN
10.0000 mg | Freq: Four times a day (QID) | INTRAMUSCULAR | Status: DC | PRN
  Administered 2023-07-18 – 2023-07-22 (×5): 10 mg via INTRAVENOUS
  Filled 2023-07-18 (×5): qty 2

## 2023-07-18 MED ORDER — SERTRALINE HCL 100 MG PO TABS
100.0000 mg | ORAL_TABLET | Freq: Every day | ORAL | Status: DC
  Administered 2023-07-19 – 2023-07-23 (×5): 100 mg via ORAL
  Filled 2023-07-18 (×5): qty 1

## 2023-07-18 MED ORDER — ONDANSETRON HCL 4 MG/2ML IJ SOLN
4.0000 mg | Freq: Once | INTRAMUSCULAR | Status: AC
  Administered 2023-07-18: 4 mg via INTRAVENOUS
  Filled 2023-07-18: qty 2

## 2023-07-18 MED ORDER — INSULIN GLARGINE (1 UNIT DIAL) 300 UNIT/ML ~~LOC~~ SOPN: 10.0000 [IU] | PEN_INJECTOR | Freq: Every day | SUBCUTANEOUS | Status: DC

## 2023-07-18 MED ORDER — INSULIN ASPART 100 UNIT/ML IJ SOLN
10.0000 [IU] | INTRAMUSCULAR | Status: AC
  Administered 2023-07-18: 10 [IU] via SUBCUTANEOUS

## 2023-07-18 MED ORDER — MOMETASONE FURO-FORMOTEROL FUM 200-5 MCG/ACT IN AERO
2.0000 | INHALATION_SPRAY | Freq: Two times a day (BID) | RESPIRATORY_TRACT | Status: DC
  Administered 2023-07-19 – 2023-07-23 (×8): 2 via RESPIRATORY_TRACT
  Filled 2023-07-18 (×2): qty 8.8

## 2023-07-18 MED ORDER — INSULIN ASPART 100 UNIT/ML IJ SOLN
0.0000 [IU] | INTRAMUSCULAR | Status: DC
  Administered 2023-07-19: 11 [IU] via SUBCUTANEOUS
  Administered 2023-07-19: 5 [IU] via SUBCUTANEOUS
  Administered 2023-07-19 (×2): 3 [IU] via SUBCUTANEOUS
  Administered 2023-07-20: 2 [IU] via SUBCUTANEOUS
  Administered 2023-07-20: 5 [IU] via SUBCUTANEOUS
  Administered 2023-07-20: 3 [IU] via SUBCUTANEOUS
  Administered 2023-07-20 – 2023-07-21 (×3): 5 [IU] via SUBCUTANEOUS
  Administered 2023-07-21: 8 [IU] via SUBCUTANEOUS
  Administered 2023-07-21: 2 [IU] via SUBCUTANEOUS

## 2023-07-18 MED ORDER — OXYCODONE-ACETAMINOPHEN 5-325 MG PO TABS
1.0000 | ORAL_TABLET | Freq: Four times a day (QID) | ORAL | Status: DC | PRN
  Administered 2023-07-19: 2 via ORAL
  Filled 2023-07-18: qty 2

## 2023-07-18 NOTE — Consult Note (Signed)
 Cardiology Consultation   Patient ID: [name] [name] MRN: [id number]; DOB: [birth year]  Admit date: 07/18/2023 Date of Consult: 07/18/2023  PCP:  [facility]   [facility] Providers Cardiologist:  Shelda Bruckner, MD        Patient Profile:   [name] [name] is a 53 y.o. female with a hx of chronic systolic heart failure with LVEF 20-25%, COPD, tobacco use, diabetes mellitus, anxiety, and bipolar disorder who is being seen 07/18/2023 for the evaluation of acute on chronic systolic heart failure at the request of internal medicine.  History of Present Illness:   [name] is a very pleasant woman who presents with three days of severe shortness of breath on exertion and orthopnea.  She is unable to walk across the room without gasping for breath.  She is additionally having terrible orthopnea/PND waking from sleep multiple times an hours gasping for air.   She states that she is taking her bumex  at home, thinks she is on 25 mg, but notes that she has not had a good urinary response to this medication and is unsure of why she was switched from lasix .    She has had intermittent chest pains, not responsive to her home oxycodone  dose of 15 mg.     Past Medical History:  Diagnosis Date   Anxiety disorder    Asthma    Bipolar affective disorder (HCC)    Blood transfusion without reported diagnosis    CHF (congestive heart failure) (HCC)    COPD (chronic obstructive pulmonary disease) (HCC)    Depression    Diabetes mellitus    Migraine     Past Surgical History:  Procedure Laterality Date   KNEE ARTHROSCOPY     x 2   KNEE SURGERY Left    Incision made and knee cleaned out   TUBAL LIGATION       Home Medications:  Prior to Admission medications   Medication Sig Start Date End Date Taking? Authorizing Provider  albuterol  (VENTOLIN  HFA) 108 (90 Base) MCG/ACT inhaler Inhale 2 puffs into the lungs every 6 (six) hours as needed for wheezing or  shortness of breath. 09/27/22   Arrien, Elidia Sieving, MD  bumetanide  (BUMEX ) 1 MG tablet Take 1 tablet (1 mg total) by mouth daily. 05/27/23 06/26/23  Arrien, Elidia Sieving, MD  glucose blood [facility] VERIO) test strip Use three times daily as directed to check blood sugar 02/11/23   Alexander-Savino, [place], MD  insulin  glargine, 1 Unit Dial , (TOUJEO  SOLOSTAR) 300 UNIT/ML Solostar Pen Inject 10 Units into the skin at bedtime. Take 15 units daily for 2 days while on prednisone  and then go back to your home dose of 10 units daily at bedtime 07/10/23 08/09/23  Fairy Frames, MD  Insulin  Pen Needle 32G X 4 MM MISC Use As Directed 09/30/22   Arrien, Elidia Sieving, MD  Insulin  Pen Needle 32G X 4 MM MISC Use 3 (three) times daily. 02/11/23   Alexander-Savino, [place], MD  ipratropium-albuterol  (DUONEB) 0.5-2.5 (3) MG/3ML SOLN inhale 3 mLs by nebulization every 6 (six) hours as needed. Patient taking differently: Take 3 mLs by nebulization every 6 (six) hours as needed (for shortness of breath). 05/27/23   Arrien, Elidia Sieving, MD  Lancet Devices [facility] DELICA PLUS LANCING) MISC Use 3 (three) times daily. 02/11/23   Alexander-Savino, [place], MD  Lurasidone  HCl 60 MG TABS Take 1 tablet (60 mg total) by mouth daily with supper. 05/27/23 06/26/23  Arrien, Elidia Sieving, MD  midodrine  (PROAMATINE ) 10 MG tablet Take 1 tablet (10 mg total) by mouth 3 (three) times daily with meals. 05/27/23   Arrien, Mauricio Daniel, MD  mometasone -formoterol  (DULERA ) 200-5 MCG/ACT AERO Inhale 2 puffs into the lungs 2 (two) times daily. 05/27/23 06/26/23  Arrien, Elidia Sieving, MD  OneTouch Delica Lancets 33G MISC Use 3 (three) times daily as directed to check blood sugar 02/11/23   Alexander-Savino, [place], MD  oxyCODONE -acetaminophen  (PERCOCET) 10-325 MG tablet Take 1 tablet by mouth every 6 (six) hours as needed for pain. 09/02/22   Wouk, Devaughn Sayres, MD  rosuvastatin  (CRESTOR ) 20 MG tablet Take 1 tablet (20 mg total)  by mouth daily. 05/27/23 06/26/23  Arrien, Elidia Sieving, MD  sertraline  (ZOLOFT ) 100 MG tablet Take 1 tablet (100 mg total) by mouth daily. 05/27/23   Arrien, Elidia Sieving, MD  spironolactone  (ALDACTONE ) 25 MG tablet Take 0.5 tablets (12.5 mg total) by mouth daily. 07/10/23   Fairy Frames, MD  benztropine  (COGENTIN ) 1 MG tablet Take 1 tablet (1 mg total) by mouth 2 (two) times daily. 12/23/18 06/04/20  Malinda Rogue, MD  insulin  aspart (NOVOLOG ) 100 UNIT/ML injection Inject 0-9 Units into the skin 3 (three) times daily with meals. Patient not taking: Reported on 06/03/2020 12/16/18 06/04/20  Christobal Guadalajara, MD    Inpatient Medications: Scheduled Meds:  insulin  aspart  10 Units Intravenous Once   Continuous Infusions:  PRN Meds:   Allergies:    Allergies  Allergen Reactions   Tramadol  Nausea Only and Rash   Wellbutrin [Bupropion] Other (See Comments)    Behavioral changes (aggression, irritability)   Benadryl  [Diphenhydramine ] Other (See Comments)    Decreases motor skills and increases anxiety   Lactose Intolerance (Gi) Diarrhea   Nicotine  Rash    Patient states she is allergic to the Nicotine  patch and they cause her to break out in a rash.  She states she can smoke cigarettes.    Social History:   Social History   Socioeconomic History   Marital status: Divorced    Spouse name: Not on file   Number of children: Not on file   Years of education: Not on file   Highest education level: Not on file  Occupational History   Not on file  Tobacco Use   Smoking status: Every Day    Current packs/day: 1.00    Average packs/day: 1 pack/day for 29.0 years (29.0 ttl pk-yrs)    Types: Cigarettes   Smokeless tobacco: Never  Vaping Use   Vaping status: Former  Substance and Sexual Activity   Alcohol  use: Not Currently    Comment: occasionally   Drug use: No   Sexual activity: Yes    Birth control/protection: Surgical  Other Topics Concern   Not on file  Social History  Narrative   Patient lives in [place] with her 2 kids- 18 in 5 years old.   She has total 4 kids.   She is a single mom.         Social Drivers of Health   Financial Resource Strain: High Risk (09/30/2022)   Overall Financial Resource Strain (CARDIA)    Difficulty of Paying Living Expenses: Hard  Food Insecurity: No Food Insecurity (07/06/2023)   Hunger Vital Sign    Worried About Running Out of Food in the Last Year: Never true    Ran Out of Food in the Last Year: Never true  Recent Concern: Food Insecurity - Food Insecurity Present (07/06/2023)   Hunger Vital Sign  Worried About Programme Researcher, Broadcasting/film/video in the Last Year: Never true    Ran Out of Food in the Last Year: Sometimes true  Transportation Needs: No Transportation Needs (07/06/2023)   PRAPARE - Administrator, Civil Service (Medical): No    Lack of Transportation (Non-Medical): No  Physical Activity: Not on file  Stress: No Stress Concern Present (01/07/2022)   Received from [facility], [facility] of Occupational Health - Occupational Stress Questionnaire    Feeling of Stress : Not at all  Social Connections: Unknown (07/06/2023)   Social Connection and Isolation Panel [NHANES]    Frequency of Communication with Friends and Family: Twice a week    Frequency of Social Gatherings with Friends and Family: Once a week    Attends Religious Services: Patient declined    Database Administrator or Organizations: Patient declined    Attends Banker Meetings: Patient declined    Marital Status: Divorced  Catering Manager Violence: Not At Risk (07/06/2023)   Humiliation, Afraid, Rape, and Kick questionnaire    Fear of Current or Ex-Partner: No    Emotionally Abused: No    Physically Abused: No    Sexually Abused: No    Family History:    Family History  Problem Relation Age of Onset   Bipolar disorder Mother    Hypertension Father    Diabetes Father      ROS:  Please see  the history of present illness.   All other ROS reviewed and negative.     Physical Exam/Data:   Vitals:   07/18/23 1632 07/18/23 2000 07/18/23 2045 07/18/23 2049  BP: 132/89  (!) 116/41   Pulse: (!) 109 (!) 107 (!) 107   Resp: (!) 29 (!) 29 17   Temp: 99.7 F (37.6 C)   97.9 F (36.6 C)  TempSrc: Oral   Oral  SpO2: 100% 100% 100%   Weight:      Height:       No intake or output data in the 24 hours ending 07/18/23 2115    07/18/2023    4:26 PM 07/10/2023    8:41 AM 07/10/2023    4:00 AM  Last 3 Weights  Weight (lbs) 150 lb 164 lb 8 oz 163 lb 9.3 oz  Weight (kg) 68.04 kg 74.617 kg 74.2 kg     Body mass index is 24.96 kg/m.  General:  Well developed, appears uncomfortable  HEENT: normal Neck: using accessory muscles for breathing, difficult to appreciate JVP  Vascular: No carotid bruits; Distal pulses 2+ bilaterally Cardiac:  normal S1, S2; RRR; no murmur  Lungs:  clear to auscultation bilaterally, no wheezing, rhonchi or rales  Abd: soft, nontender, no hepatomegaly  Ext: no edema Musculoskeletal:  No deformities, BUE and BLE strength normal and equal Skin: warm and dry  Neuro:  CNs 2-12 intact, no focal abnormalities noted Psych:  Normal affect   EKG:  The EKG was personally reviewed and demonstrates:  sinus tachycardia, PVC, RAD, LVH and ST-T wave changes secondary to hypertrophy, LAE  Relevant CV Studies: Limited echo 05/25/23 IMPRESSIONS     1. Limited Echo.   2. Left ventricular ejection fraction, by estimation, is 20 to 25%. The  left ventricle has severely decreased function. The left ventricle  demonstrates global hypokinesis. The left ventricular internal cavity size  was dilated. Left ventricular diastolic   parameters are consistent with Grade III diastolic dysfunction  (restrictive).   3.  Right ventricular systolic function grossly normal. The right  ventricular size is grossly normal. There is mildly elevated pulmonary  artery systolic pressure.    4. Left atrial size was visually dilated.   5. The mitral valve is degenerative. Severe mitral valve regurgitation.  No evidence of mitral stenosis.   6. The aortic valve is tricuspid. Aortic valve regurgitation is not  visualized. No aortic stenosis is present.   7. The inferior vena cava is normal in size with <50% respiratory  variability, suggesting right atrial pressure of 8 mmHg.   Laboratory Data:  High Sensitivity Troponin:  No results for input(s): TROPONINIHS in the last 720 hours.   Chemistry Recent Labs  Lab 07/18/23 1740  NA 133*  K 4.4  CL 101  CO2 19*  GLUCOSE 648*  BUN 20  CREATININE 1.00  CALCIUM  8.5*  GFRNONAA >60  ANIONGAP 13    No results for input(s): PROT, ALBUMIN , AST, ALT, ALKPHOS, BILITOT in the last 168 hours. Lipids No results for input(s): CHOL, TRIG, HDL, LABVLDL, LDLCALC, CHOLHDL in the last 168 hours.  Hematology Recent Labs  Lab 07/18/23 1740  WBC 7.3  RBC 4.34  HGB 12.1  HCT 38.3  MCV 88.2  MCH 27.9  MCHC 31.6  RDW 16.3*  PLT 211   Thyroid  No results for input(s): TSH, FREET4 in the last 168 hours.  BNP Recent Labs  Lab 07/18/23 1740  BNP 4,443.3*    DDimer No results for input(s): DDIMER in the last 168 hours.   Radiology/Studies:  DG Chest Portable 1 View Result Date: 07/18/2023 CLINICAL DATA:  Shortness of breath EXAM: PORTABLE CHEST 1 VIEW COMPARISON:  Chest x-ray 07/05/2023 FINDINGS: Heart is moderately enlarged, unchanged. The lungs are clear. There is no pleural effusion or pneumothorax. No acute fractures are seen. IMPRESSION: Moderate cardiomegaly. No acute cardiopulmonary process. Electronically Signed   By: Greig Pique M.D.   On: 07/18/2023 18:39     Assessment and Plan:   53 y.o. female with a hx of chronic systolic heart failure with LVEF 20-25%, COPD, tobacco use, diabetes mellitus, anxiety, and bipolar disorder who is being seen 07/18/2023 for the evaluation of acute on chronic  systolic heart failure at the request of internal medicine.  Acute on chronic systolic heart failure  - agree with diuresis given her severe orthopnea and DOE - would trial 80 mg IV lasix  given her lack of response to 1 mg PO bumex   - goal net negative 1-2 liters in the next 24 hours  - monitor respiratory status, would be reasonable to evaluate for pulm etiology contributing to her symptoms  - fluid restrict <2 L per day  - strict intake and output  - low sodium  - daily standing weights    Risk Assessment/Risk Scores:        [place]  Heart Association (NYHA) Functional Class NYHA Class IV        For questions or updates, please contact [facility] Please consult [email or web address] for contact info under    Signed, Darleene JONETTA Mace, MD  07/18/2023 9:15 PM

## 2023-07-18 NOTE — ED Triage Notes (Signed)
 Pt BIB [facility] from home with [facility] and CP since Saturday. Pt has hx of CHF; Per EMS lung sounds clear. Pt c/o 8/10 central CP with radiation to abdomen; EMS given 324 ASA. All VSS per EMS; Satting 95-100% on 3L [place] with increased work of breathing.

## 2023-07-18 NOTE — ED Provider Notes (Signed)
 [place] [facility] DEPARTMENT AT [facility] Provider Note   CSN: [id number] Arrival date & time: 07/18/23  1609     History  Chief Complaint  Patient presents with   Shortness of Breath   Chest Pain    DEVONE BONILLA is a 53 y.o. female.  Patient is a 53 year old female with advanced heart failure, COPD, diabetes, CKD stage III, asthma, anxiety, depression, and bipolar affective disorder presenting for complaints of shortness of breath.  Patient states her symptoms have been worsening 2 days after her discharge from the hospital on 07/10/2023 after diagnosis advanced heart failure.  She states over the past 5 to 6 days she has had significant orthopnea with inability to lie flat at any time due to shortness of breath.  She is so short of breath now that she can even walk to the bathroom or get up from the seated position.  She states she has not been checking her weight so she has unknown weight gain.  Admits to minimal bilateral lower extremity swelling.  She denies fevers, chills, coughing.  Admits to nausea without vomiting.  No diarrhea.  The history is provided by the patient. No language interpreter was used.  Shortness of Breath Associated symptoms: chest pain   Associated symptoms: no abdominal pain, no cough, no ear pain, no fever, no rash, no sore throat and no vomiting   Chest Pain Associated symptoms: shortness of breath   Associated symptoms: no abdominal pain, no back pain, no cough, no fever, no palpitations and no vomiting        Home Medications Prior to Admission medications   Medication Sig Start Date End Date Taking? Authorizing Provider  albuterol  (VENTOLIN  HFA) 108 (90 Base) MCG/ACT inhaler Inhale 2 puffs into the lungs every 6 (six) hours as needed for wheezing or shortness of breath. 09/27/22   Arrien, Elidia Sieving, MD  bumetanide  (BUMEX ) 1 MG tablet Take 1 tablet (1 mg total) by mouth daily. 05/27/23 06/26/23  Arrien, Elidia Sieving, MD   glucose blood [facility] VERIO) test strip Use three times daily as directed to check blood sugar 02/11/23   Alexander-Savino, [place], MD  insulin  glargine, 1 Unit Dial , (TOUJEO  SOLOSTAR) 300 UNIT/ML Solostar Pen Inject 10 Units into the skin at bedtime. Take 15 units daily for 2 days while on prednisone  and then go back to your home dose of 10 units daily at bedtime 07/10/23 08/09/23  Fairy Frames, MD  Insulin  Pen Needle 32G X 4 MM MISC Use As Directed 09/30/22   Arrien, Elidia Sieving, MD  Insulin  Pen Needle 32G X 4 MM MISC Use 3 (three) times daily. 02/11/23   Alexander-Savino, [place], MD  ipratropium-albuterol  (DUONEB) 0.5-2.5 (3) MG/3ML SOLN inhale 3 mLs by nebulization every 6 (six) hours as needed. Patient taking differently: Take 3 mLs by nebulization every 6 (six) hours as needed (for shortness of breath). 05/27/23   Arrien, Elidia Sieving, MD  Lancet Devices [facility] DELICA PLUS LANCING) MISC Use 3 (three) times daily. 02/11/23   Alexander-Savino, [place], MD  Lurasidone  HCl 60 MG TABS Take 1 tablet (60 mg total) by mouth daily with supper. 05/27/23 06/26/23  Arrien, Mauricio Daniel, MD  midodrine  (PROAMATINE ) 10 MG tablet Take 1 tablet (10 mg total) by mouth 3 (three) times daily with meals. 05/27/23   Arrien, Mauricio Daniel, MD  mometasone -formoterol  (DULERA ) 200-5 MCG/ACT AERO Inhale 2 puffs into the lungs 2 (two) times daily. 05/27/23 06/26/23  Arrien, Elidia Sieving, MD  OneTouch Delica Lancets 33G  MISC Use 3 (three) times daily as directed to check blood sugar 02/11/23   Alexander-Savino, [place], MD  oxyCODONE -acetaminophen  (PERCOCET) 10-325 MG tablet Take 1 tablet by mouth every 6 (six) hours as needed for pain. 09/02/22   Wouk, Devaughn Sayres, MD  rosuvastatin  (CRESTOR ) 20 MG tablet Take 1 tablet (20 mg total) by mouth daily. 05/27/23 06/26/23  Arrien, Elidia Sieving, MD  sertraline  (ZOLOFT ) 100 MG tablet Take 1 tablet (100 mg total) by mouth daily. 05/27/23   Arrien, Elidia Sieving,  MD  spironolactone  (ALDACTONE ) 25 MG tablet Take 0.5 tablets (12.5 mg total) by mouth daily. 07/10/23   Fairy Frames, MD  benztropine  (COGENTIN ) 1 MG tablet Take 1 tablet (1 mg total) by mouth 2 (two) times daily. 12/23/18 06/04/20  Malinda Rogue, MD  insulin  aspart (NOVOLOG ) 100 UNIT/ML injection Inject 0-9 Units into the skin 3 (three) times daily with meals. Patient not taking: Reported on 06/03/2020 12/16/18 06/04/20  Christobal Guadalajara, MD      Allergies    Tramadol , Wellbutrin [bupropion], Benadryl  [diphenhydramine ], Lactose intolerance (gi), and Nicotine     Review of Systems   Review of Systems  Constitutional:  Negative for chills and fever.  HENT:  Negative for ear pain and sore throat.   Eyes:  Negative for pain and visual disturbance.  Respiratory:  Positive for shortness of breath. Negative for cough.   Cardiovascular:  Positive for chest pain. Negative for palpitations.  Gastrointestinal:  Negative for abdominal pain and vomiting.  Genitourinary:  Negative for dysuria and hematuria.  Musculoskeletal:  Negative for arthralgias and back pain.  Skin:  Negative for color change and rash.  Neurological:  Negative for seizures and syncope.  All other systems reviewed and are negative.   Physical Exam Updated Vital Signs BP (!) 116/41   Pulse (!) 107   Temp 97.9 F (36.6 C) (Oral)   Resp 17   Ht 5' 5 (1.651 m)   Wt 68 kg   LMP  (LMP Unknown) Comment: Pt is poor historian  SpO2 100%   BMI 24.96 kg/m  Physical Exam Vitals and nursing note reviewed.  Constitutional:      General: She is not in acute distress.    Appearance: She is well-developed.  HENT:     Head: Normocephalic and atraumatic.  Eyes:     Conjunctiva/sclera: Conjunctivae normal.  Cardiovascular:     Rate and Rhythm: Regular rhythm. Tachycardia present.     Heart sounds: No murmur heard. Pulmonary:     Effort: Tachypnea, accessory muscle usage and respiratory distress present.     Breath sounds: Examination  of the right-lower field reveals rales. Examination of the left-lower field reveals rales. Rales present.  Abdominal:     Palpations: Abdomen is soft.     Tenderness: There is no abdominal tenderness.  Musculoskeletal:        General: No swelling.     Cervical back: Neck supple.     Right lower leg: 1+ Edema present.     Left lower leg: 1+ Edema present.  Skin:    General: Skin is warm and dry.     Capillary Refill: Capillary refill takes less than 2 seconds.  Neurological:     Mental Status: She is alert.  Psychiatric:        Mood and Affect: Mood normal.     ED Results / Procedures / Treatments   Labs (all labs ordered are listed, but only abnormal results are displayed) Labs Reviewed  CBC WITH DIFFERENTIAL/PLATELET -  Abnormal; Notable for the following components:      Result Value   RDW 16.3 (*)    nRBC 0.3 (*)    Lymphs Abs 0.4 (*)    All other components within normal limits  BASIC METABOLIC PANEL - Abnormal; Notable for the following components:   Sodium 133 (*)    CO2 19 (*)    Glucose, Bld 648 (*)    Calcium  8.5 (*)    All other components within normal limits  BRAIN NATRIURETIC PEPTIDE - Abnormal; Notable for the following components:   B Natriuretic Peptide 4,443.3 (*)    All other components within normal limits  I-STAT VENOUS BLOOD GAS, ED - Abnormal; Notable for the following components:   pCO2, Ven 32.9 (*)    pO2, Ven 52 (*)    Sodium 134 (*)    All other components within normal limits  RESP PANEL BY RT-PCR (RSV, FLU A&B, COVID)  [id number]    EKG EKG Interpretation Date/Time:  Friday July 18 2023 17:48:04 EST Ventricular Rate:  110 PR Interval:  162 QRS Duration:  113 QT Interval:  363 QTC Calculation: 492 R Axis:   -1  Text Interpretation: Sinus tachycardia Ventricular premature complex Right atrial enlargement Left ventricular hypertrophy Anterior ST elevation, probably due to LVH Borderline prolonged QT interval Confirmed by Elnor Hila  ([id number]) on 07/18/2023 6:17:18 PM  Radiology DG Chest Portable 1 View Result Date: 07/18/2023 CLINICAL DATA:  Shortness of breath EXAM: PORTABLE CHEST 1 VIEW COMPARISON:  Chest x-ray 07/05/2023 FINDINGS: Heart is moderately enlarged, unchanged. The lungs are clear. There is no pleural effusion or pneumothorax. No acute fractures are seen. IMPRESSION: Moderate cardiomegaly. No acute cardiopulmonary process. Electronically Signed   By: Greig Pique M.D.   On: 07/18/2023 18:39    Procedures .Critical Care  Performed by: Elnor Hila SQUIBB, DO Authorized by: Elnor Hila SQUIBB, DO   Critical care provider statement:    Critical care time (minutes):  30   Critical care was necessary to treat or prevent imminent or life-threatening deterioration of the following conditions:  Respiratory failure   Critical care was time spent personally by me on the following activities:  Development of treatment plan with patient or surrogate, discussions with consultants, evaluation of patient's response to treatment, examination of patient, ordering and review of laboratory studies, ordering and review of radiographic studies, ordering and performing treatments and interventions, pulse oximetry, re-evaluation of patient's condition and review of old charts   Care discussed with: admitting provider       Medications Ordered in ED Medications  ondansetron  (ZOFRAN ) injection 4 mg (4 mg Intravenous Given 07/18/23 1949)  ketorolac  (TORADOL ) 15 MG/ML injection 15 mg (15 mg Intravenous Given 07/18/23 1950)  oxyCODONE  (Oxy IR/ROXICODONE ) immediate release tablet 10 mg (10 mg Oral Given 07/18/23 2056)  insulin  aspart (novoLOG ) injection 10 Units (10 Units Intravenous Given 07/18/23 2116)  furosemide  (LASIX ) injection 80 mg (80 mg Intravenous Given 07/18/23 2115)    ED Course/ Medical Decision Making/ A&P                                 Medical Decision Making Amount and/or Complexity of Data Reviewed Labs: ordered. Radiology:  ordered.  Risk Prescription drug management. Decision regarding hospitalization.   60:55 PM  53 year old female with advanced heart failure, COPD, diabetes, CKD stage III, asthma, anxiety, depression, and bipolar affective disorder presenting for complaints of shortness of  breath.   On exam patient is tachypneic, tachycardic, on 6 L nasal cannula.  Rales in the lower lung bases.  Minimal nonpitting edema lower extremities.  Chest x-ray demonstrates cardiomegaly with pulmonary vascular congestion but no significant pleural effusions.  Creatinine stable at this time.  BNP in the upper 4000's and is in the 3000's on the last admission week.  Recommending admission at this time with cardiology on consult.  I spoke with cardiology team who is going to come down to the bedside to see the patient.  Lasix  IV 80 mg ordered.  Patient is also diabetic on insulin .  Glucose in the 600s.  VBG pending.  IV insulin  10 units given.  VBG stable without signs of DKA.  Will withhold fluid boluses at this time due to CHF exacerbation.  Patient accepted by admitting team.       Final Clinical Impression(s) / ED Diagnoses Final diagnoses:  Acute on chronic systolic CHF (congestive heart failure) (HCC)  Dyspnea, unspecified type  Orthopnea  Hyperglycemia    Rx / DC Orders ED Discharge Orders     None         Elnor Bernarda SQUIBB, DO 07/18/23 2209

## 2023-07-18 NOTE — ED Notes (Signed)
 Lab called and they are running labs now.

## 2023-07-18 NOTE — H&P (Addendum)
 History and Physical    [name] [name] FMW:[id number] DOB: 30-Sep-1970 DOA: 07/18/2023  PCP: [facility]   Patient coming from: Home   Chief Complaint:  Chief Complaint  Patient presents with   Shortness of Breath   Chest Pain   ED TRIAGE note:  Pt BIB [facility] from home with [facility] and CP since Saturday. Pt has hx of CHF; Per EMS lung sounds clear. Pt c/o 8/10 central CP with radiation to abdomen; EMS given 324 ASA. All VSS per EMS; Satting 95-100% on 3L [place] with increased work of breathing    HPI:  [name] [name] is a 53 y.o. female with medical history significant of chronic systolic heart failure with reduced EF 20 to 25%, chronic hypotension on midodrine , CKD stage III, insulin -dependent DM type II, essential hypertension, COPD, chronic pain syndrome and bipolar disorder presented to emergency department complaining of shortness of breath that has been worsening for last 2 days after discharge from the hospital 06/30/2023 when patient was diagnosed with advanced heart failure.  Patient reported that for last 5 to 6 days she has significant not apnea with inability to lie flat due to shortness of breath.  Patient also has significant dyspnea on exertion and limiting movement.  Per patient's report she is unsure about the dosing of the Bumex  and reported that she does not have any good urinary response at Bumex . Patient is also complaining about mid substernal pressure-like chest pain for last 6 to 7 days which is constant in nature and reported that she has been taking oxycodone  at home without any improvement.  Stating that her physician has been increased the oxycodone  dose to 15 mg every 6 hours scheduled and she has been taking it without any improvement of the chest pain.  Reported also feeling nauseous. Patient is stating that she is not taking any insulin  regimen at home as she does not know how to take insulin  regimen.  In the ED patient has been evaluated by cardiology Dr.  Darleene Mace.  Cardiology recommended trial of IV Lasix  80 mg as patient has lack of response to Bumex  with net -1 to 2 L goal in next 24 hours.  Also recommended to assess for respiratory status for pulmonary standpoint.  At presentation to ED patient is tachycardic heart rate 107, tachypneic 29, blood pressure borderline soft 116/40 on and O2 sat 100% on 2 L oxygen . VBG showed pH 7.4, pCO2 32, pO2 52 and bicarb 21. Respiratory panel negative. Elevated BNP 4443.  BNP has been trended up even before previous hospital admission. BMP showing low sodium 133, low bicarb 19, elevated blood glucose 684, normal anion gap 13. CBC unremarkable for any leukocytosis.  Normal platelet count.  EKG showing sinus tachycardia heart rate 110.  Premature left ventricular complex and right atrial enlargement.  Left ventricular hypertrophy pattern.  Chest x-ray showed moderate cardiomegaly and no acute cardiopulmonary process.  In the ED patient has been given Lasix  80 mg IV, insulin  10 units, Toradol  15 mg and oxycodone  10 mg.  Dr. Elnor reported that patient has chronic pain syndrome and requesting for pain medications multiple times.  Hospitalist has been contacted for further management of acute on chronic CHF and hyperglycemic crisis.    Significant labs in the ED: Lab Orders         Resp panel by RT-PCR (RSV, Flu A&B, Covid) Anterior Nasal Swab         CBC with Differential  Basic metabolic panel         Brain natriuretic peptide         Osmolality         CBC         Comprehensive metabolic panel         I-Stat venous blood gas, ([facility] ED, [facility], [facility])         CBG monitoring, ED         CBG monitoring, ED       Review of Systems:  Review of Systems  Constitutional:  Negative for chills, fever, malaise/fatigue and weight loss.  Respiratory:  Negative for cough, sputum production, shortness of breath and wheezing.   Cardiovascular:  Positive for chest pain, orthopnea and PND. Negative for  palpitations, claudication and leg swelling.  Gastrointestinal:  Positive for nausea. Negative for abdominal pain, heartburn and vomiting.  Genitourinary:  Negative for dysuria, frequency and urgency.  Musculoskeletal:  Negative for back pain, myalgias and neck pain.  Neurological:  Negative for dizziness and headaches.    Past Medical History:  Diagnosis Date   Anxiety disorder    Asthma    Bipolar affective disorder (HCC)    Blood transfusion without reported diagnosis    CHF (congestive heart failure) (HCC)    COPD (chronic obstructive pulmonary disease) (HCC)    Depression    Diabetes mellitus    Migraine     Past Surgical History:  Procedure Laterality Date   KNEE ARTHROSCOPY     x 2   KNEE SURGERY Left    Incision made and knee cleaned out   TUBAL LIGATION       reports that she has been smoking cigarettes. She has a 29 pack-year smoking history. She has never used smokeless tobacco. She reports that she does not currently use alcohol . She reports that she does not use drugs.  Allergies  Allergen Reactions   Tramadol  Nausea Only and Rash   Wellbutrin [Bupropion] Other (See Comments)    Behavioral changes (aggression, irritability)   Benadryl  [Diphenhydramine ] Other (See Comments)    Decreases motor skills and increases anxiety   Lactose Intolerance (Gi) Diarrhea   Nicotine  Rash    Patient states she is allergic to the Nicotine  patch and they cause her to break out in a rash.  She states she can smoke cigarettes.    Family History  Problem Relation Age of Onset   Bipolar disorder Mother    Hypertension Father    Diabetes Father     Prior to Admission medications   Medication Sig Start Date End Date Taking? Authorizing Provider  albuterol  (VENTOLIN  HFA) 108 (90 Base) MCG/ACT inhaler Inhale 2 puffs into the lungs every 6 (six) hours as needed for wheezing or shortness of breath. 09/27/22   Arrien, Elidia Sieving, MD  bumetanide  (BUMEX ) 1 MG tablet Take 1 tablet  (1 mg total) by mouth daily. 05/27/23 06/26/23  Arrien, Elidia Sieving, MD  glucose blood [facility] VERIO) test strip Use three times daily as directed to check blood sugar 02/11/23   Alexander-Savino, [place], MD  insulin  glargine, 1 Unit Dial , (TOUJEO  SOLOSTAR) 300 UNIT/ML Solostar Pen Inject 10 Units into the skin at bedtime. Take 15 units daily for 2 days while on prednisone  and then go back to your home dose of 10 units daily at bedtime 07/10/23 08/09/23  Fairy Frames, MD  Insulin  Pen Needle 32G X 4 MM MISC Use As Directed 09/30/22   Arrien, Elidia Sieving, MD  Insulin  Pen  Needle 32G X 4 MM MISC Use 3 (three) times daily. 02/11/23   Alexander-Savino, [place], MD  ipratropium-albuterol  (DUONEB) 0.5-2.5 (3) MG/3ML SOLN inhale 3 mLs by nebulization every 6 (six) hours as needed. Patient taking differently: Take 3 mLs by nebulization every 6 (six) hours as needed (for shortness of breath). 05/27/23   Arrien, Elidia Sieving, MD  Lancet Devices [facility] DELICA PLUS LANCING) MISC Use 3 (three) times daily. 02/11/23   Alexander-Savino, [place], MD  Lurasidone  HCl 60 MG TABS Take 1 tablet (60 mg total) by mouth daily with supper. 05/27/23 06/26/23  Arrien, Mauricio Daniel, MD  midodrine  (PROAMATINE ) 10 MG tablet Take 1 tablet (10 mg total) by mouth 3 (three) times daily with meals. 05/27/23   Arrien, Mauricio Daniel, MD  mometasone -formoterol  (DULERA ) 200-5 MCG/ACT AERO Inhale 2 puffs into the lungs 2 (two) times daily. 05/27/23 06/26/23  Arrien, Elidia Sieving, MD  OneTouch Delica Lancets 33G MISC Use 3 (three) times daily as directed to check blood sugar 02/11/23   Alexander-Savino, [place], MD  oxyCODONE -acetaminophen  (PERCOCET) 10-325 MG tablet Take 1 tablet by mouth every 6 (six) hours as needed for pain. 09/02/22   Wouk, Devaughn Sayres, MD  rosuvastatin  (CRESTOR ) 20 MG tablet Take 1 tablet (20 mg total) by mouth daily. 05/27/23 06/26/23  Arrien, Mauricio Daniel, MD  sertraline  (ZOLOFT ) 100 MG tablet Take 1  tablet (100 mg total) by mouth daily. 05/27/23   Arrien, Elidia Sieving, MD  spironolactone  (ALDACTONE ) 25 MG tablet Take 0.5 tablets (12.5 mg total) by mouth daily. 07/10/23   Fairy Frames, MD  benztropine  (COGENTIN ) 1 MG tablet Take 1 tablet (1 mg total) by mouth 2 (two) times daily. 12/23/18 06/04/20  Malinda Rogue, MD  insulin  aspart (NOVOLOG ) 100 UNIT/ML injection Inject 0-9 Units into the skin 3 (three) times daily with meals. Patient not taking: Reported on 06/03/2020 12/16/18 06/04/20  Christobal Guadalajara, MD     Physical Exam: Vitals:   07/18/23 2000 07/18/23 2045 07/18/23 2049 07/19/23 0042  BP:  (!) 116/41    Pulse: (!) 107 (!) 107    Resp: (!) 29 17    Temp:   97.9 F (36.6 C) 97.8 F (36.6 C)  TempSrc:   Oral Oral  SpO2: 100% 100%    Weight:      Height:        Physical Exam Constitutional:      Appearance: She is not ill-appearing.  HENT:     Mouth/Throat:     Mouth: Mucous membranes are moist.  Cardiovascular:     Rate and Rhythm: Regular rhythm. Tachycardia present.  Pulmonary:     Effort: Pulmonary effort is normal.     Breath sounds: Normal breath sounds. No decreased breath sounds, wheezing, rhonchi or rales.  Chest:     Chest wall: No deformity or tenderness.  Musculoskeletal:     Cervical back: Normal range of motion and neck supple.     Right lower leg: Edema present.     Left lower leg: Edema present.     Comments: Bilateral lower extremities trace edema  Skin:    General: Skin is warm.     Capillary Refill: Capillary refill takes less than 2 seconds.  Neurological:     Mental Status: She is alert and oriented to person, place, and time.  Psychiatric:        Mood and Affect: Mood is anxious.      Labs on Admission: I have personally reviewed following labs and imaging studies  CBC:  Recent Labs  Lab 07/18/23 1740 07/18/23 2051  WBC 7.3  --   NEUTROABS 6.5  --   HGB 12.1 13.6  HCT 38.3 40.0  MCV 88.2  --   PLT 211  --    Basic Metabolic  Panel: Recent Labs  Lab 07/18/23 1740 07/18/23 2051  NA 133* 134*  K 4.4 4.4  CL 101  --   CO2 19*  --   GLUCOSE 648*  --   BUN 20  --   CREATININE 1.00  --   CALCIUM  8.5*  --    GFR: Estimated Creatinine Clearance: 57.9 mL/min (by C-G formula based on SCr of 1 mg/dL). Liver Function Tests: No results for input(s): AST, ALT, ALKPHOS, BILITOT, PROT, ALBUMIN  in the last 168 hours. No results for input(s): LIPASE, AMYLASE in the last 168 hours. No results for input(s): AMMONIA in the last 168 hours. Coagulation Profile: No results for input(s): INR, PROTIME in the last 168 hours. Cardiac Enzymes: Recent Labs  Lab 07/18/23 2343  TROPONINIHS 53*   BNP (last 3 results) Recent Labs    05/22/23 1458 07/05/23 1625 07/18/23 1740  BNP 2,978.4* 3,720.3* 4,443.3*   HbA1C: No results for input(s): HGBA1C in the last 72 hours. CBG: Recent Labs  Lab 07/18/23 2224 07/19/23 0047  GLUCAP 497* 308*   Lipid Profile: No results for input(s): CHOL, HDL, LDLCALC, TRIG, CHOLHDL, LDLDIRECT in the last 72 hours. Thyroid  Function Tests: No results for input(s): TSH, T4TOTAL, FREET4, T3FREE, THYROIDAB in the last 72 hours. Anemia Panel: No results for input(s): VITAMINB12, FOLATE, FERRITIN, TIBC, IRON , RETICCTPCT in the last 72 hours. Urine analysis:    Component Value Date/Time   COLORURINE YELLOW 09/06/2022 0108   APPEARANCEUR HAZY (A) 09/06/2022 0108   LABSPEC 1.026 09/06/2022 0108   PHURINE 5.0 09/06/2022 0108   GLUCOSEU >=500 (A) 09/06/2022 0108   HGBUR NEGATIVE 09/06/2022 0108   HGBUR trace-intact 11/25/2008 1530   BILIRUBINUR NEGATIVE 09/06/2022 0108   KETONESUR NEGATIVE 09/06/2022 0108   PROTEINUR NEGATIVE 09/06/2022 0108   UROBILINOGEN 0.2 05/12/2014 2315   NITRITE NEGATIVE 09/06/2022 0108   LEUKOCYTESUR NEGATIVE 09/06/2022 0108    Radiological Exams on Admission: I have personally reviewed images DG Chest  Portable 1 View Result Date: 07/18/2023 CLINICAL DATA:  Shortness of breath EXAM: PORTABLE CHEST 1 VIEW COMPARISON:  Chest x-ray 07/05/2023 FINDINGS: Heart is moderately enlarged, unchanged. The lungs are clear. There is no pleural effusion or pneumothorax. No acute fractures are seen. IMPRESSION: Moderate cardiomegaly. No acute cardiopulmonary process. Electronically Signed   By: Greig Pique M.D.   On: 07/18/2023 18:39     EKG: My personal interpretation of EKG shows: sinus rhythm and tachycardia heart rate 110.  Premature left ventricular complex and right atrial enlargement.  Left ventricular hypertrophy pattern.   Assessment/Plan: Principal Problem:   Acute on chronic combined systolic (congestive) and diastolic (congestive) heart failure (HCC) Active Problems:   Hyperglycemic crisis due to diabetes mellitus (HCC)   CKD stage 3a, GFR 45-59 ml/min (HCC)   DM2 (diabetes mellitus, type 2) (HCC)   COPD (chronic obstructive pulmonary disease) (HCC)   Chronic pain syndrome   Bipolar 1 disorder (HCC)   GAD (generalized anxiety disorder)    Assessment and Plan: Acute on chronic combined systolic diastolic heart failure Grade 3 diastolic heart failure and systolic heart failure EF 20 to 25% > Patient is presented to emergency department complaining of orthopnea, dyspnea and chest pressure pain which has been getting worse since patient  has been discharged from the hospital 07/10/2023. - While patient was admitted 2 weeks ago has been diagnosed with advanced heart failure initially treated with IV Lasix  afterward transition to oral Bumex  and started on spironolactone . -Patient was hypoxic in the ED however O2 sat never drop below 88%.  Currently 100% on 3 L oxygen  - Baseline BNP around 700 to  2000 however BNP today around 4400. - Initial troponin 53. - EKG showing sinus tachycardia heart rate 110. -Given patient does not have any ST-T wave abnormality low concern for acute coronary syndrome  however will follow-up with troponin level.  Chest pain that patient is experiencing likely chest pressure from CHF rather than any ACS. - Chest x-ray showing cardiomegaly without any acute cardiopulmonary process. - In the ED patient has been evaluated by cardiology Dr. Joane Mace who recommended to give IV Lasix  80 mg and look for the response with urine output. - Given patient's blood pressure is borderline soft 116/41 resuming oral midodrine  10 mg 3 times daily.  Given patient has low ejection fraction 20 to 25% it is expected to having low blood pressure at the baseline. - Echocardiogram from 05/25/2023 showing EF 20 to 25% and grade 3 diastolic heart failure. -Consulting advanced heart failure team - Continue IV Lasix  60 mg twice daily.  Continue spironolactone  12.5 mg daily. -Starting Jardiance  10 mg daily. - Strict I's/O, monitor urine output and goal is -1 to 2 L in next 24 hours. -Continue fluid restriction less than 1.8 l/day and salt restriction 2 g/day. -Deferring for repeat echocardiogram given patient just had an echo 2 weeks ago. - Continue cardiac monitoring.   Hyperglycemic crisis due to uncontrolled DM type II Insulin  dependent DM type II Patient has history of noncompliance with insulin   -BMP showing blood glucose around 700.  In ED patient has been given NovoLog  10 units.  Repeat blood glucose is around 500.  Pending serum osmolarity.  Patient with normal bicarb and normal anion gap.  VBG no evidence of acidosis, bicarb 21. - Continue clear liquid diet.  Once blood glucose will be more controlled can transition to carb modified-solid food - Given blood glucose is still elevated around 500 giving another dose of NovoLog  10 units.  Continue to check POC blood glucose every 4 hours and continue high sliding scale SSI. -Continue Semglee  10 units at bedtime. -Consulted inpatient diabetic educator for diet and insulin  compliance. Addendum - Patient blood glucose has been  improved 500 to 308 after giving total 20 units of NovoLog .  Elevated serum osmolarity 309. -Given patient blood glucose has been improved deferring insulin  drip at this time.  Continue to check POC blood Kos every 4 hours with high sliding scale insulin  coverage.    History of COPD -Currently O2 sat 100% on 2 L oxygen .  Current Dulera  twice daily and DuoNeb as needed.  Chronic pain syndrome -Patient requesting for pain medications multiple times in the ED. In ED patient has been given Toradol  10 mg, oxycodone  10 mg.  Patient is still complaining of generalized pain. - Patient has pain medication seeking behavior per chart review of previous hospital admission as well as per ED physician report. Reviewed PDMP which showed patient continues to refill oxycodone -acetaminophen  10/325 mg every 30 days also taking gabapentin , lorazepam  and zolpidem . -Patient reported that she has been taking Percocet 15 mg every 6 hours without any improvement of her chronic pain.  Requesting for more pain medications. -Continue Percocet 1 to 2 tablet for moderate to  severe pain. -Patient is reluctant towards education about she will not need any further pain medications however patient continues to be very stubborn and requesting for more pain medications. - Starting low-dose Dilaudid  1 mg every four 4-hour as needed for severe pain.  Bipolar disorder Generalized anxiety disorder -Continue Zoloft   Hyperlipidemia - Continue Crestor .    DVT prophylaxis:  Lovenox  Code Status:  Full Code Diet: Clear liquid diabetic diet.  Fluid restriction 1.8 L/day and salt restriction 2 g/day Family Communication:   No family member present at the bedside Disposition Plan: Continue to monitor improvement of volume status in the setting of CHF.  Goal to -1 to 2 L next 24 hours. Consults: Advanced heart failure team and cardiology Admission status:   Inpatient, progressive unit.  Severity of Illness: The appropriate  patient status for this patient is INPATIENT. Inpatient status is judged to be reasonable and necessary in order to provide the required intensity of service to ensure the patient's safety. The patient's presenting symptoms, physical exam findings, and initial radiographic and laboratory data in the context of their chronic comorbidities is felt to place them at high risk for further clinical deterioration. Furthermore, it is not anticipated that the patient will be medically stable for discharge from the hospital within 2 midnights of admission.   * I certify that at the point of admission it is my clinical judgment that the patient will require inpatient hospital care spanning beyond 2 midnights from the point of admission due to high intensity of service, high risk for further deterioration and high frequency of surveillance required.DEWAINE    Toren Tucholski, MD [facility]  How to contact the [facility] Attending or Consulting provider 7A - 7P or covering provider during after hours 7P -7A, for this patient.  Check the care team in [facility] and look for a) attending/consulting [facility] provider listed and b) the [facility] team listed Log into [email or web address] and use [facility] universal password to access. If you do not have the password, please contact the hospital operator. Locate the [facility] provider you are looking for under [facility] and page to a number that you can be directly reached. If you still have difficulty reaching the provider, please page the [facility] (Director on Call) for the Hospitalists listed on amion for assistance.  07/19/2023, 12:58 AM

## 2023-07-19 DIAGNOSIS — I5023 Acute on chronic systolic (congestive) heart failure: Secondary | ICD-10-CM

## 2023-07-19 DIAGNOSIS — N1831 Chronic kidney disease, stage 3a: Secondary | ICD-10-CM

## 2023-07-19 DIAGNOSIS — J42 Unspecified chronic bronchitis: Secondary | ICD-10-CM

## 2023-07-19 DIAGNOSIS — Z794 Long term (current) use of insulin: Secondary | ICD-10-CM

## 2023-07-19 DIAGNOSIS — F319 Bipolar disorder, unspecified: Secondary | ICD-10-CM

## 2023-07-19 DIAGNOSIS — E119 Type 2 diabetes mellitus without complications: Secondary | ICD-10-CM

## 2023-07-19 DIAGNOSIS — I340 Nonrheumatic mitral (valve) insufficiency: Secondary | ICD-10-CM | POA: Diagnosis not present

## 2023-07-19 DIAGNOSIS — I159 Secondary hypertension, unspecified: Secondary | ICD-10-CM

## 2023-07-19 DIAGNOSIS — G894 Chronic pain syndrome: Secondary | ICD-10-CM

## 2023-07-19 LAB — CBC
HCT: 35.9 % — ABNORMAL LOW (ref 36.0–46.0)
Hemoglobin: 11.1 g/dL — ABNORMAL LOW (ref 12.0–15.0)
MCH: 28 pg (ref 26.0–34.0)
MCHC: 30.9 g/dL (ref 30.0–36.0)
MCV: 90.7 fL (ref 80.0–100.0)
Platelets: 202 10*3/uL (ref 150–400)
RBC: 3.96 MIL/uL (ref 3.87–5.11)
RDW: 16 % — ABNORMAL HIGH (ref 11.5–15.5)
WBC: 8.4 10*3/uL (ref 4.0–10.5)
nRBC: 0.2 % (ref 0.0–0.2)

## 2023-07-19 LAB — COMPREHENSIVE METABOLIC PANEL
ALT: 97 U/L — ABNORMAL HIGH (ref 0–44)
AST: 50 U/L — ABNORMAL HIGH (ref 15–41)
Albumin: 2.9 g/dL — ABNORMAL LOW (ref 3.5–5.0)
Alkaline Phosphatase: 94 U/L (ref 38–126)
Anion gap: 9 (ref 5–15)
BUN: 22 mg/dL — ABNORMAL HIGH (ref 6–20)
CO2: 23 mmol/L (ref 22–32)
Calcium: 8.1 mg/dL — ABNORMAL LOW (ref 8.9–10.3)
Chloride: 104 mmol/L (ref 98–111)
Creatinine, Ser: 1.2 mg/dL — ABNORMAL HIGH (ref 0.44–1.00)
GFR, Estimated: 54 mL/min — ABNORMAL LOW (ref >60)
Glucose, Bld: 319 mg/dL — ABNORMAL HIGH (ref 70–99)
Potassium: 3.8 mmol/L (ref 3.5–5.1)
Sodium: 136 mmol/L (ref 135–145)
Total Bilirubin: 1.3 mg/dL — ABNORMAL HIGH (ref 0.0–1.2)
Total Protein: 5.2 g/dL — ABNORMAL LOW (ref 6.5–8.1)

## 2023-07-19 LAB — OSMOLALITY: Osmolality: 309 mosm/kg — ABNORMAL HIGH (ref 275–295)

## 2023-07-19 LAB — CBG MONITORING, ED
Glucose-Capillary: 308 mg/dL — ABNORMAL HIGH (ref 70–99)
Glucose-Capillary: 61 mg/dL — ABNORMAL LOW (ref 70–99)
Glucose-Capillary: 62 mg/dL — ABNORMAL LOW (ref 70–99)
Glucose-Capillary: 64 mg/dL — ABNORMAL LOW (ref 70–99)
Glucose-Capillary: 72 mg/dL (ref 70–99)
Glucose-Capillary: 85 mg/dL (ref 70–99)

## 2023-07-19 LAB — TROPONIN I (HIGH SENSITIVITY)
Troponin I (High Sensitivity): 50 ng/L — ABNORMAL HIGH (ref <18)
Troponin I (High Sensitivity): 53 ng/L — ABNORMAL HIGH (ref <18)
Troponin I (High Sensitivity): 53 ng/L — ABNORMAL HIGH (ref <18)
Troponin I (High Sensitivity): 54 ng/L — ABNORMAL HIGH (ref <18)

## 2023-07-19 LAB — GLUCOSE, CAPILLARY
Glucose-Capillary: 175 mg/dL — ABNORMAL HIGH (ref 70–99)
Glucose-Capillary: 180 mg/dL — ABNORMAL HIGH (ref 70–99)
Glucose-Capillary: 234 mg/dL — ABNORMAL HIGH (ref 70–99)
Glucose-Capillary: 110 mg/dL — ABNORMAL HIGH (ref 70–99)

## 2023-07-19 LAB — LACTIC ACID, PLASMA: Lactic Acid, Venous: 1.9 mmol/L (ref 0.5–1.9)

## 2023-07-19 MED ORDER — OXYCODONE-ACETAMINOPHEN 5-325 MG PO TABS
2.0000 | ORAL_TABLET | Freq: Four times a day (QID) | ORAL | Status: DC | PRN
  Administered 2023-07-19 – 2023-07-23 (×13): 2 via ORAL
  Filled 2023-07-19 (×15): qty 2

## 2023-07-19 MED ORDER — FUROSEMIDE 10 MG/ML IJ SOLN
10.0000 mg/h | INTRAVENOUS | Status: DC
  Administered 2023-07-19 – 2023-07-20 (×2): 10 mg/h via INTRAVENOUS
  Filled 2023-07-19 (×2): qty 20

## 2023-07-19 NOTE — ED Notes (Signed)
 ED phlebotomy to come draw bloodwork.

## 2023-07-19 NOTE — Progress Notes (Signed)
 Progress Note  Patient Name: [name] [name] Date of Encounter: 07/19/2023  Primary Cardiologist: Shelda Bruckner, MD  Subjective   Continues to have DOE.  Inpatient Medications    Scheduled Meds:  empagliflozin   10 mg Oral Daily   enoxaparin  (LOVENOX ) injection  40 mg Subcutaneous Q24H   insulin  aspart  0-15 Units Subcutaneous Q4H   insulin  glargine-yfgn  10 Units Subcutaneous QHS   midodrine   10 mg Oral TID WC   mometasone -formoterol   2 puff Inhalation BID   rosuvastatin   20 mg Oral Daily   sertraline   100 mg Oral Daily   sodium chloride  flush  3 mL Intravenous Q12H   Continuous Infusions:  sodium chloride      furosemide  (LASIX ) 200 mg in dextrose  5 % 100 mL (2 mg/mL) infusion     PRN Meds: sodium chloride , acetaminophen  **OR** acetaminophen , HYDROmorphone  (DILAUDID ) injection, ipratropium-albuterol , oxyCODONE -acetaminophen  **AND** [DISCONTINUED] oxyCODONE , prochlorperazine , sodium chloride  flush   Vital Signs    Vitals:   07/19/23 0805 07/19/23 0941 07/19/23 1056 07/19/23 1139  BP: 106/67 104/74 102/64 95/61  Pulse: 89 85 84 85  Resp:  19 15 16   Temp:    97.7 F (36.5 C)  TempSrc:    Oral  SpO2:  100% 97% 95%  Weight:      Height:        Intake/Output Summary (Last 24 hours) at 07/19/2023 1215 Last data filed at 07/19/2023 1059 Gross per 24 hour  Intake 664 ml  Output 1250 ml  Net -586 ml   Filed Weights   07/18/23 1626  Weight: 68 kg    Telemetry     Personally reviewed. NSR.  ECG    Not performed today  Physical Exam   GEN: No acute distress.   Neck: JVD positive Cardiac: RRR, no murmur, rub, or gallop.  Respiratory: Nonlabored. Clear to auscultation bilaterally. GI: Soft, nontender, bowel sounds present.  Abdomen distention present MS: No edema; No deformity. Neuro:  Nonfocal. Psych: Alert and oriented x 3. Normal affect.  Labs    Chemistry Recent Labs  Lab 07/18/23 1740 07/18/23 2051 07/19/23 0109  NA 133* 134* 136  K  4.4 4.4 3.8  CL 101  --  104  CO2 19*  --  23  GLUCOSE 648*  --  319*  BUN 20  --  22*  CREATININE 1.00  --  1.20*  CALCIUM  8.5*  --  8.1*  PROT  --   --  5.2*  ALBUMIN   --   --  2.9*  AST  --   --  50*  ALT  --   --  97*  ALKPHOS  --   --  94  BILITOT  --   --  1.3*  GFRNONAA >60  --  54*  ANIONGAP 13  --  9     Hematology Recent Labs  Lab 07/18/23 1740 07/18/23 2051 07/19/23 0109  WBC 7.3  --  8.4  RBC 4.34  --  3.96  HGB 12.1 13.6 11.1*  HCT 38.3 40.0 35.9*  MCV 88.2  --  90.7  MCH 27.9  --  28.0  MCHC 31.6  --  30.9  RDW 16.3*  --  16.0*  PLT 211  --  202    Cardiac Enzymes Recent Labs  Lab 07/18/23 2343 07/19/23 0109 07/19/23 0514 07/19/23 0838  TROPONINIHS 53* 54* 53* 50*    BNP Recent Labs  Lab 07/18/23 1740  BNP 4,443.3*     DDimerNo results  for input(s): DDIMER in the last 168 hours.   Radiology    DG Chest Portable 1 View Result Date: 07/18/2023 CLINICAL DATA:  Shortness of breath EXAM: PORTABLE CHEST 1 VIEW COMPARISON:  Chest x-ray 07/05/2023 FINDINGS: Heart is moderately enlarged, unchanged. The lungs are clear. There is no pleural effusion or pneumothorax. No acute fractures are seen. IMPRESSION: Moderate cardiomegaly. No acute cardiopulmonary process. Electronically Signed   By: Greig Pique M.D.   On: 07/18/2023 18:39    Assessment & Plan    Acute on chronic systolic heart failure Nonischemic cardiomyopathy LVEF 20 to 25% (LHC in 2022 showed normal coronaries) Severe mitral regurgitation   -Presented with DOE, orthopnea, PND and abdomen distention x 1 week. BNP 4443.  AST, ALT and total bilirubin elevated.  She received IV Lasix  80 mg dose with inadequate urine output.  Started Lasix  10 mg/h.  Obtain lactic acid.  If she does not make adequate urine output, will need to increase Lasix  drip.  Continue Jardiance  10 mg once daily.  Midodrine  started per primary team.  Continue rosuvastatin  20 mg nightly. -Outpatient CHF  follow-up.  Signed, Diannah SHAUNNA Maywood, MD  07/19/2023, 12:15 PM

## 2023-07-19 NOTE — Inpatient Diabetes Management (Signed)
 Inpatient Diabetes Program Recommendations  AACE/ADA: New Consensus Statement on Inpatient Glycemic Control (2015)  Target Ranges:  Prepandial:   less than 140 mg/dL      Peak postprandial:   less than 180 mg/dL (1-2 hours)      Critically ill patients:  140 - 180 mg/dL   Lab Results  Component Value Date   GLUCAP 64 (L) 07/19/2023   HGBA1C 8.2 (H) 05/22/2023    Review of Glycemic Control  Latest Reference Range & Units 07/19/23 00:47 07/19/23 04:28 07/19/23 07:21 07/19/23 07:51  Glucose-Capillary 70 - 99 mg/dL 691 (H) 72 61 (L) 64 (L)  (H): Data is abnormally high (L): Data is abnormally low Diabetes history: Type 2 DM Outpatient Diabetes medications: Toujeo  10 units QHS Current orders for Inpatient glycemic control: Novolog  0-15 units Q4H, Jardiance  10 mg every day, Semglee  10 units at bedtime Novolog  10 units x 2  Inpatient Diabetes Program Recommendations:    Noted consult and hypoglycemia following doses of short acting insulin  while in ED. In agreement with current orders.  Will follow during hospitalization.   DM coordinator covering multiple campuses over weekend. Page with questions or concerns 8A-5P.   Thanks, Tinnie Minus, MSN, RNC-OB Diabetes Coordinator [phone] (8a-5p)

## 2023-07-19 NOTE — Assessment & Plan Note (Addendum)
Uncontrolled hyperglycemia.  Glucose this am fasting 317 mg/dl   Plan to continue insulin for glucose cover and monitoring.  Increase basal insulin to 12 units and will add at bedtime coverage.   Dyslipidemia, continue with statin therapy.

## 2023-07-19 NOTE — Assessment & Plan Note (Addendum)
 No signs of acute exacerbation.  Continue with bronchodilator therapy.  Inhaled corticosteroid.  She has chronic wheezing.

## 2023-07-19 NOTE — Assessment & Plan Note (Signed)
Continue with sertraline  

## 2023-07-19 NOTE — Assessment & Plan Note (Addendum)
Hyponatremia. AKI (baseline cr 1,3)   Renal function with serum cr at 1,0 with K at 3,8 and serum bicarbonate at 28  Na 142   Resume bumetanide today Follow up renal function and electrolytes in am.

## 2023-07-19 NOTE — ED Notes (Signed)
 Patient given regular ginger ale due to hypoglycemia. Dr. Arrien made aware. Patient requesting regular diet; Dr. Sunnie England made aware.

## 2023-07-19 NOTE — ED Notes (Signed)
Patient provided with apple juice.

## 2023-07-19 NOTE — ED Notes (Signed)
Patient provided with graham crackers and peanut butter.

## 2023-07-19 NOTE — Progress Notes (Signed)
  Progress Note   Patient: [name] [name] FMW:[id number] DOB: [birth year] DOA: 07/18/2023     1 DOS: the patient was seen and examined on 07/19/2023   Brief hospital course: [name] [name] was admitted to the hospital with the working diagnosis of acute on chronic systolic heart failure.   53 yo female with the past medical history of heart failure, CKD, T2DM, COPD, bipolar and chronic pain syndrome who presented with dyspnea for the last 5 to 6 days prior to admission. Positive dyspnea on exertion, PND and orthopnea.   On her initial physical examination her blood pressure was 116/40, HR 107, RR 29 and 02 saturation 100% on 2 L/min per [place].  Lungs with no wheezing or rhonchi, heart with S1 and S2 present and regular with no gallops, rubs or murmurs, abdomen with no distention and positive lower extremity edema.     Assessment and Plan: * Acute on chronic systolic CHF (congestive heart failure) (HCC) Echocardiogram with reduced LV systolic function EF 20 to 25%, global hypokinesis, LV cavity dilatated, RV systolic function preserved, severe mitral valve regurgitation. RVSP 37,4 mmHg,   Patient with improvement in her volume status but not back to baseline.  Systolic blood pressure 100 mmHg.   Plan to continue diuresis IV lasix  drip.  Blood pressure support with midodrine .  Continue with SGLT 2 inh.  Limited pharmacologic therapy due to risk of hypotension.   COPD (chronic obstructive pulmonary disease) (HCC) No signs of acute exacerbation.  Continue with bronchodilator therapy.   CKD stage 3a, GFR 45-59 ml/min (HCC) Hyponatremia.   Renal function with serum cr at 1,20 with K at 3,8 and serum bicarbonate at 22.  Na 136   Continue diuresis with lasix  drip  Follow up renal function and electrolytes.  Add 40 meq Kcl to avoid hypokalemia.   DM2 (diabetes mellitus, type 2) (HCC) Uncontrolled hyperglycemia.  Glucose this am fasting 319 mg/dl   Plan to continue insulin  for glucose cover  and monitoring.  Continue basal insulin .   Dyslipidemia, continue with statin therapy.   HTN (hypertension) Continue blood pressure support with midodrine    Chronic pain syndrome Continue with oxycodone .   Bipolar 1 disorder (HCC) Continue with sertraline .         Subjective: patient with improvement in dyspnea but not back to baseline, no chest pain, no nausea or vomiting.   Physical Exam: Vitals:   07/19/23 1056 07/19/23 1139 07/19/23 1235 07/19/23 1600  BP: 102/64 95/61 114/65 (!) 95/57  Pulse: 84 85    Resp: 15 16 18    Temp:  97.7 F (36.5 C) 97.9 F (36.6 C) 97.8 F (36.6 C)  TempSrc:  Oral Oral Oral  SpO2: 97% 95%  92%  Weight:   74.6 kg   Height:   5' 5 (1.651 m)    Neurology awake and alert ENT with mild pallor Cardiovascular with S1 and S2 present and regular with no gallops or rubs, positive systolic murmur at the apex Respiratory with mid rales at bases and scatted expiratory wheezing, no rhonchi Abdomen with no distention  No lower extremity edema  Data Reviewed:    Family Communication: no family at the bedside   Disposition: Status is: Inpatient Remains inpatient appropriate because: IV diuresis   Planned Discharge Destination: Home     Author: Elidia Toribio Furnace, MD 07/19/2023 4:16 PM  For on call review [email or web address].

## 2023-07-19 NOTE — Assessment & Plan Note (Signed)
 Continue with oxycodone

## 2023-07-19 NOTE — Hospital Course (Addendum)
 [name] [name] was admitted to the hospital with the working diagnosis of acute on chronic systolic heart failure.   53 yo female with the past medical history of heart failure, CKD, T2DM, COPD, bipolar and chronic pain syndrome who presented with dyspnea for the last 5 to 6 days prior to admission. Positive dyspnea on exertion, PND and orthopnea.   On her initial physical examination her blood pressure was 116/40, HR 107, RR 29 and 02 saturation 100% on 2 L/min per [place].  Lungs with no wheezing or rhonchi, heart with S1 and S2 present and regular with no gallops, rubs or murmurs, abdomen with no distention and positive lower extremity edema.   Na 133, K 4,4 CL 101, bicarbonate 19, glucose 648, bun 20 cr 1,0 anion gap 13  VBG 7,42/ 32.9/ 52/ 88%  BNP 4,443  High sensitive troponin 53 and 54   Wbc 7,3 hgb 12.1 plt 211  Sars covid 19 negative  Influenza negative   Chest radiograph with cardiomegaly with bilateral hilar vascular congestion, cephalization of the vasculature, no effusions or infiltrates.   EKG 110, right axis deviation, qtc 492, sinus rhythm with bilateral atrial enlargement, no significant ST segment changes, negative T wave V5 and V6, positive LVH.   02/09 despite diuresis, she continue with dyspnea on exertion. Intermittent chest pain improved with analgesics.  02/10 clinically with euvolemic state. Pending final cardiology recommendations.  V/Q scan low probability for pulmonary embolism.  02/11 resume oral loop diuretic, if improvement of her symptoms, possible discharge tomorrow.

## 2023-07-19 NOTE — Assessment & Plan Note (Addendum)
 Echocardiogram with reduced LV systolic function EF 20 to 25%, global hypokinesis, LV cavity dilatated, RV systolic function preserved, severe mitral valve regurgitation. RVSP 37,4 mmHg,   Acute on chronic core pulmonale Pulmonary hypertension.   Urine output 2,600 ml Systolic blood pressure 115 mmHg range.   Blood pressure support with midodrine .  Continue with SGLT 2 inh and resume bumetanide  1 mg daily.  Limited pharmacologic therapy due to risk of hypotension.   Non sustained VT,  Keep K at 4 and Mg at 2.  Continue telemetry monitoring.   Patient had CT chest angiography in 05/2023 negative for pulmonary embolism. V/Q scan negative for chronic thromboembolic disease.   Acute hypoxemic respiratory failure due to cardiogenic pulmonary edema, has resolved. 02 saturation today is 95% on room air.  Dyspnea has been persistent possibly not cardiac or pulmonary in origin.  If symptoms improved, plan is for discharge home tomorrow.

## 2023-07-19 NOTE — Assessment & Plan Note (Signed)
 Continue blood pressure support with midodrine.

## 2023-07-20 ENCOUNTER — Other Ambulatory Visit: Payer: Self-pay

## 2023-07-20 DIAGNOSIS — I5023 Acute on chronic systolic (congestive) heart failure: Secondary | ICD-10-CM | POA: Diagnosis not present

## 2023-07-20 DIAGNOSIS — J42 Unspecified chronic bronchitis: Secondary | ICD-10-CM | POA: Diagnosis not present

## 2023-07-20 DIAGNOSIS — E119 Type 2 diabetes mellitus without complications: Secondary | ICD-10-CM | POA: Diagnosis not present

## 2023-07-20 DIAGNOSIS — I340 Nonrheumatic mitral (valve) insufficiency: Secondary | ICD-10-CM | POA: Diagnosis not present

## 2023-07-20 DIAGNOSIS — I428 Other cardiomyopathies: Secondary | ICD-10-CM | POA: Diagnosis not present

## 2023-07-20 DIAGNOSIS — N1831 Chronic kidney disease, stage 3a: Secondary | ICD-10-CM | POA: Diagnosis not present

## 2023-07-20 LAB — BASIC METABOLIC PANEL WITH GFR
Anion gap: 15 (ref 5–15)
BUN: 29 mg/dL — ABNORMAL HIGH (ref 6–20)
CO2: 27 mmol/L (ref 22–32)
Calcium: 7.4 mg/dL — ABNORMAL LOW (ref 8.9–10.3)
Chloride: 94 mmol/L — ABNORMAL LOW (ref 98–111)
Creatinine, Ser: 1.75 mg/dL — ABNORMAL HIGH (ref 0.44–1.00)
GFR, Estimated: 34 mL/min — ABNORMAL LOW
Glucose, Bld: 120 mg/dL — ABNORMAL HIGH (ref 70–99)
Potassium: 3.3 mmol/L — ABNORMAL LOW (ref 3.5–5.1)
Sodium: 136 mmol/L (ref 135–145)

## 2023-07-20 LAB — MAGNESIUM: Magnesium: 1.7 mg/dL (ref 1.7–2.4)

## 2023-07-20 LAB — COOXEMETRY PANEL
Carboxyhemoglobin: 1.9 % — ABNORMAL HIGH (ref 0.5–1.5)
Methemoglobin: <0.7 % (ref 0.0–1.5)
O2 Saturation: 63 %
Total hemoglobin: 11.4 g/dL — ABNORMAL LOW (ref 12.0–16.0)

## 2023-07-20 LAB — GLUCOSE, CAPILLARY
Glucose-Capillary: 139 mg/dL — ABNORMAL HIGH (ref 70–99)
Glucose-Capillary: 216 mg/dL — ABNORMAL HIGH (ref 70–99)
Glucose-Capillary: 196 mg/dL — ABNORMAL HIGH (ref 70–99)
Glucose-Capillary: 227 mg/dL — ABNORMAL HIGH (ref 70–99)
Glucose-Capillary: 217 mg/dL — ABNORMAL HIGH (ref 70–99)

## 2023-07-20 LAB — LACTIC ACID, PLASMA: Lactic Acid, Venous: 1.2 mmol/L (ref 0.5–1.9)

## 2023-07-20 MED ORDER — POTASSIUM CHLORIDE CRYS ER 20 MEQ PO TBCR
20.0000 meq | EXTENDED_RELEASE_TABLET | Freq: Once | ORAL | Status: AC
  Administered 2023-07-20: 20 meq via ORAL
  Filled 2023-07-20: qty 1

## 2023-07-20 MED ORDER — SODIUM CHLORIDE 0.9% FLUSH: 10.0000 mL | INTRAVENOUS | Status: DC | PRN

## 2023-07-20 MED ORDER — SODIUM CHLORIDE 0.9% FLUSH
10.0000 mL | Freq: Two times a day (BID) | INTRAVENOUS | Status: DC
  Administered 2023-07-20 – 2023-07-23 (×6): 10 mL

## 2023-07-20 MED ORDER — POTASSIUM CHLORIDE CRYS ER 10 MEQ PO TBCR
10.0000 meq | EXTENDED_RELEASE_TABLET | Freq: Once | ORAL | Status: AC
  Administered 2023-07-20: 10 meq via ORAL
  Filled 2023-07-20: qty 1

## 2023-07-20 MED ORDER — CHLORHEXIDINE GLUCONATE CLOTH 2 % EX PADS
6.0000 | MEDICATED_PAD | Freq: Every day | CUTANEOUS | Status: DC
  Administered 2023-07-20 – 2023-07-23 (×4): 6 via TOPICAL

## 2023-07-20 MED ORDER — MAGNESIUM SULFATE 2 GM/50ML IV SOLN
2.0000 g | Freq: Once | INTRAVENOUS | Status: AC
  Administered 2023-07-20: 2 g via INTRAVENOUS
  Filled 2023-07-20: qty 50

## 2023-07-20 NOTE — Progress Notes (Signed)
 Patient had a 14 beat run of non-sustained Vtach. Upon entering the room patient is diaphoretic and  short of breath. Vital are WDL. See chart. Opyd MD notified. Please see new orders.

## 2023-07-20 NOTE — Progress Notes (Addendum)
 Progress Note   Patient: QUATISHA ZYLKA FMW:[id number] DOB: [birth year] DOA: 07/18/2023     2 DOS: the patient was seen and examined on 07/20/2023   Brief hospital course: Mrs. Verne was admitted to the hospital with the working diagnosis of acute on chronic systolic heart failure.   53 yo female with the past medical history of heart failure, CKD, T2DM, COPD, bipolar and chronic pain syndrome who presented with dyspnea for the last 5 to 6 days prior to admission. Positive dyspnea on exertion, PND and orthopnea.   On her initial physical examination her blood pressure was 116/40, HR 107, RR 29 and 02 saturation 100% on 2 L/min per [place].  Lungs with no wheezing or rhonchi, heart with S1 and S2 present and regular with no gallops, rubs or murmurs, abdomen with no distention and positive lower extremity edema.   02/09 despite diuresis, she continue with dyspnea on exertion. Intermittent chest pain improved with analgesics.    Assessment and Plan: * Acute on chronic systolic CHF (congestive heart failure) (HCC) Echocardiogram with reduced LV systolic function EF 20 to 25%, global hypokinesis, LV cavity dilatated, RV systolic function preserved, severe mitral valve regurgitation. RVSP 37,4 mmHg,   Acute on chronic core pulmonale Pulmonary hypertension.   Systolic blood pressure 100 mmHg.  Urine output is 2,441 ml  Blood pressure support with midodrine .  Continue with SGLT 2 inh.  Limited pharmacologic therapy due to risk of hypotension.  Holding loop diuretic for now.  Non sustained VT, personally reviewed telemetry, patient had 13 beats of ventricular tachycardia,.  Keep K at 4 and Mg at 2.  Continue telemetry monitoring.   COPD (chronic obstructive pulmonary disease) (HCC) No signs of acute exacerbation.  Continue with bronchodilator therapy.  Inhaled corticosteroid.   CKD stage 3a, GFR 45-59 ml/min (HCC) Hyponatremia. AKI (baseline cr 1,3)   Worsening renal function with serum cr  at 1,75 with K at 3,3 and serum bicarbonate at 27  Na 136 Mg 1.7   Add Kcl po to correct hypokalemia, (20 meq KCl x1)  Add IV mag 2 g  Holding furosemide  for now.  Follow up renal function and electrolytes.  Add 40 meq Kcl to avoid hypokalemia.   DM2 (diabetes mellitus, type 2) (HCC) Uncontrolled hyperglycemia.  Glucose this am fasting 120 mg/dl   Plan to continue insulin  for glucose cover and monitoring.  Continue basal insulin .   Dyslipidemia, continue with statin therapy.   HTN (hypertension) Continue blood pressure support with midodrine    Chronic pain syndrome Continue with oxycodone .   Bipolar 1 disorder (HCC) Continue with sertraline .         Subjective: Patient continue to have dyspnea on exertion, intermittent chest pain, not exertion related, improved with analgesics, today with no PND or orthopnea   Physical Exam: Vitals:   07/20/23 0620 07/20/23 0625 07/20/23 0630 07/20/23 0731  BP:    98/62  Pulse: 71 (!) 118 80 74  Resp:    18  Temp:    98.7 F (37.1 C)  TempSrc:    Oral  SpO2: 95% 94% (!) 82% 95%  Weight:      Height:       Neurology awake and alert ENT with mild pallor Cardiovascular with S1 and S2 present and regular with no gallops, rubs or murmurs Respiratory with no rales, positive expiratory wheezing and prolonged expiratory phase Abdomen with no distention  No lower extremity edema  Data Reviewed:    Family Communication: no family at  the bedside   Disposition: Status is: Inpatient Remains inpatient appropriate because: pending cardiac workup   Planned Discharge Destination: Home     Author: Elidia Toribio Furnace, MD 07/20/2023 10:47 AM  For on call review [email or web address].

## 2023-07-20 NOTE — Plan of Care (Signed)

## 2023-07-20 NOTE — Progress Notes (Signed)
 Peripherally Inserted Central Catheter Placement  The IV Nurse has discussed with the patient and/or persons authorized to consent for the patient, the purpose of this procedure and the potential benefits and risks involved with this procedure.  The benefits include less needle sticks, lab draws from the catheter, and the patient may be discharged home with the catheter. Risks include, but not limited to, infection, bleeding, blood clot (thrombus formation), and puncture of an artery; nerve damage and irregular heartbeat and possibility to perform a PICC exchange if needed/ordered by physician.  Alternatives to this procedure were also discussed.  Bard Power PICC patient education guide, fact sheet on infection prevention and patient information card has been provided to patient /or left at bedside.  PICC inserted by Zebedee Jaeger, RN.  PICC Placement Documentation  PICC Double Lumen 07/20/23 Right Cephalic 34 cm 0 cm (Active)  Indication for Insertion or Continuance of Line Chronic illness with exacerbations (CF, Sickle Cell, etc.) 07/20/23 1753  Exposed Catheter (cm) 0 cm 07/20/23 1753  Site Assessment Clean, Dry, Intact 07/20/23 1753  Lumen #1 Status Saline locked;Blood return noted 07/20/23 1753  Lumen #2 Status Saline locked;Blood return noted 07/20/23 1753  Dressing Type Transparent;Securing device 07/20/23 1753  Dressing Status Antimicrobial disc/dressing in place;Clean, Dry, Intact 07/20/23 1753  Line Care Connections checked and tightened 07/20/23 1753  Line Adjustment (NICU/IV Team Only) No 07/20/23 1753  Dressing Intervention New dressing;Adhesive placed at insertion site (IV team only) 07/20/23 1753  Dressing Change Due 07/27/23 07/20/23 1753       Matthias Merle Chenice 07/20/2023, 5:54 PM

## 2023-07-20 NOTE — Progress Notes (Signed)
 Despite pain medication patient complains of 9/10 chest pain. Patient appears comfortable in bed each time.

## 2023-07-20 NOTE — Progress Notes (Addendum)
 Progress Note  Patient Name: [name] [name] Date of Encounter: 07/20/2023  Primary Cardiologist: Shelda Bruckner, MD  Subjective   No events overnight.  She continues to have shortness of breath and abdominal distention.  These symptoms did not improve since yesterday.  Inpatient Medications    Scheduled Meds:  empagliflozin   10 mg Oral Daily   enoxaparin  (LOVENOX ) injection  40 mg Subcutaneous Q24H   insulin  aspart  0-15 Units Subcutaneous Q4H   insulin  glargine-yfgn  10 Units Subcutaneous QHS   midodrine   10 mg Oral TID WC   mometasone -formoterol   2 puff Inhalation BID   rosuvastatin   20 mg Oral Daily   sertraline   100 mg Oral Daily   sodium chloride  flush  3 mL Intravenous Q12H   Continuous Infusions:  furosemide  (LASIX ) 200 mg in dextrose  5 % 100 mL (2 mg/mL) infusion Stopped (07/20/23 0810)   magnesium  sulfate bolus IVPB 2 g (07/20/23 0817)   PRN Meds: acetaminophen  **OR** acetaminophen , HYDROmorphone  (DILAUDID ) injection, ipratropium-albuterol , oxyCODONE -acetaminophen  **AND** [DISCONTINUED] oxyCODONE , prochlorperazine , sodium chloride  flush   Vital Signs    Vitals:   07/20/23 0620 07/20/23 0625 07/20/23 0630 07/20/23 0731  BP:    98/62  Pulse: 71 (!) 118 80 74  Resp:    18  Temp:    98.7 F (37.1 C)  TempSrc:    Oral  SpO2: 95% 94% (!) 82% 95%  Weight:      Height:        Intake/Output Summary (Last 24 hours) at 07/20/2023 0853 Last data filed at 07/20/2023 0300 Gross per 24 hour  Intake 3485.76 ml  Output 2441 ml  Net 1044.76 ml   Filed Weights   07/18/23 1626 07/19/23 1235 07/20/23 0420  Weight: 68 kg 74.6 kg 72.4 kg    Telemetry     Personally reviewed. One run of 15 beat NSVT present.  ECG    Not performed today  Physical Exam   GEN: No acute distress.   Neck: No JVD. Cardiac: RRR, no murmur, rub, or gallop.  Respiratory: Nonlabored. Clear to auscultation bilaterally. GI: Soft, nontender, bowel sounds present. MS: No edema; No  deformity. Neuro:  Nonfocal. Psych: Alert and oriented x 3. Normal affect.  Labs    Chemistry Recent Labs  Lab 07/18/23 1740 07/18/23 2051 07/19/23 0109 07/20/23 0353  NA 133* 134* 136 136  K 4.4 4.4 3.8 3.3*  CL 101  --  104 94*  CO2 19*  --  23 27  GLUCOSE 648*  --  319* 120*  BUN 20  --  22* 29*  CREATININE 1.00  --  1.20* 1.75*  CALCIUM  8.5*  --  8.1* 7.4*  PROT  --   --  5.2*  --   ALBUMIN   --   --  2.9*  --   AST  --   --  50*  --   ALT  --   --  97*  --   ALKPHOS  --   --  94  --   BILITOT  --   --  1.3*  --   GFRNONAA >60  --  54* 34*  ANIONGAP 13  --  9 15     Hematology Recent Labs  Lab 07/18/23 1740 07/18/23 2051 07/19/23 0109  WBC 7.3  --  8.4  RBC 4.34  --  3.96  HGB 12.1 13.6 11.1*  HCT 38.3 40.0 35.9*  MCV 88.2  --  90.7  MCH 27.9  --  28.0  MCHC 31.6  --  30.9  RDW 16.3*  --  16.0*  PLT 211  --  202    Cardiac Enzymes Recent Labs  Lab 07/18/23 2343 07/19/23 0109 07/19/23 0514 07/19/23 0838  TROPONINIHS 53* 54* 53* 50*    BNP Recent Labs  Lab 07/18/23 1740  BNP 4,443.3*     DDimerNo results for input(s): DDIMER in the last 168 hours.   Radiology    DG Chest Portable 1 View Result Date: 07/18/2023 CLINICAL DATA:  Shortness of breath EXAM: PORTABLE CHEST 1 VIEW COMPARISON:  Chest x-ray 07/05/2023 FINDINGS: Heart is moderately enlarged, unchanged. The lungs are clear. There is no pleural effusion or pneumothorax. No acute fractures are seen. IMPRESSION: Moderate cardiomegaly. No acute cardiopulmonary process. Electronically Signed   By: Greig Pique M.D.   On: 07/18/2023 18:39    Assessment & Plan   Acute on chronic systolic heart failure Nonischemic cardiomyopathy LVEF 20 to 25% (LHC in 2022 showed normal coronaries) Severe mitral regurgitation   -Presented with DOE, orthopnea, PND and abdominal distention x 1 week. BNP 4443. AST, ALT and total bilirubin elevated. She received IV Lasix  80 mg in the ER with inadequate  urine output. Started her on Lasix  drip 10 mg/h yesterday with 2.4L urine output and net +1.2 L.  Serum creatinine increased from 1.2 on admission to 1.75 today.  Serum chloride decreased from 104 to 94 this morning.  Serum lactic acid was normal yesterday, warm peripheries present.  Will hold off on Lasix  drip, will curbside with advanced heart failure. She might benefit from RHC. -Patient  had extensive HF work-up at [facility], not a candidate for advanced HF therapies due to psychosocial barriers. Recommended hospice care in the past but she refused stating her daughter does not like nurses visiting house.   Addendum Spoke with Dr Ria Commander. Recommended PICC line placement and to obtain Co-ox along with CVP as well as lactic acid.  Signed, Diannah SHAUNNA Maywood, MD  07/20/2023, 8:53 AM

## 2023-07-21 ENCOUNTER — Inpatient Hospital Stay (HOSPITAL_COMMUNITY): Payer: MEDICAID

## 2023-07-21 DIAGNOSIS — G894 Chronic pain syndrome: Secondary | ICD-10-CM | POA: Diagnosis not present

## 2023-07-21 DIAGNOSIS — I5043 Acute on chronic combined systolic (congestive) and diastolic (congestive) heart failure: Secondary | ICD-10-CM

## 2023-07-21 DIAGNOSIS — I5023 Acute on chronic systolic (congestive) heart failure: Secondary | ICD-10-CM | POA: Diagnosis not present

## 2023-07-21 DIAGNOSIS — E119 Type 2 diabetes mellitus without complications: Secondary | ICD-10-CM | POA: Diagnosis not present

## 2023-07-21 DIAGNOSIS — J42 Unspecified chronic bronchitis: Secondary | ICD-10-CM | POA: Diagnosis not present

## 2023-07-21 DIAGNOSIS — N1831 Chronic kidney disease, stage 3a: Secondary | ICD-10-CM | POA: Diagnosis not present

## 2023-07-21 DIAGNOSIS — R0600 Dyspnea, unspecified: Secondary | ICD-10-CM | POA: Diagnosis not present

## 2023-07-21 DIAGNOSIS — I509 Heart failure, unspecified: Secondary | ICD-10-CM | POA: Insufficient documentation

## 2023-07-21 LAB — GLUCOSE, CAPILLARY
Glucose-Capillary: 149 mg/dL — ABNORMAL HIGH (ref 70–99)
Glucose-Capillary: 258 mg/dL — ABNORMAL HIGH (ref 70–99)
Glucose-Capillary: 75 mg/dL (ref 70–99)
Glucose-Capillary: 210 mg/dL — ABNORMAL HIGH (ref 70–99)
Glucose-Capillary: 154 mg/dL — ABNORMAL HIGH (ref 70–99)
Glucose-Capillary: 214 mg/dL — ABNORMAL HIGH (ref 70–99)

## 2023-07-21 LAB — ECHOCARDIOGRAM LIMITED
Calc EF: 21.2 %
Height: 65 in
S' Lateral: 6.4 cm
Single Plane A2C EF: 20.6 %
Single Plane A4C EF: 24.4 %
Weight: 2528 [oz_av]

## 2023-07-21 LAB — BASIC METABOLIC PANEL
Anion gap: 6 (ref 5–15)
BUN: 25 mg/dL — ABNORMAL HIGH (ref 6–20)
CO2: 33 mmol/L — ABNORMAL HIGH (ref 22–32)
Calcium: 7.5 mg/dL — ABNORMAL LOW (ref 8.9–10.3)
Chloride: 97 mmol/L — ABNORMAL LOW (ref 98–111)
Creatinine, Ser: 0.96 mg/dL (ref 0.44–1.00)
GFR, Estimated: >60 mL/min (ref >60)
Glucose, Bld: 298 mg/dL — ABNORMAL HIGH (ref 70–99)
Potassium: 3.7 mmol/L (ref 3.5–5.1)
Sodium: 136 mmol/L (ref 135–145)

## 2023-07-21 LAB — COOXEMETRY PANEL
Carboxyhemoglobin: 2 % — ABNORMAL HIGH (ref 0.5–1.5)
Methemoglobin: <0.7 % (ref 0.0–1.5)
O2 Saturation: 58.8 %
Total hemoglobin: 11.2 g/dL — ABNORMAL LOW (ref 12.0–16.0)

## 2023-07-21 LAB — MAGNESIUM: Magnesium: 2 mg/dL (ref 1.7–2.4)

## 2023-07-21 MED ORDER — PERFLUTREN LIPID MICROSPHERE
1.0000 mL | INTRAVENOUS | Status: AC | PRN
  Administered 2023-07-21: 3 mL via INTRAVENOUS

## 2023-07-21 MED ORDER — INSULIN ASPART 100 UNIT/ML IJ SOLN
0.0000 [IU] | Freq: Three times a day (TID) | INTRAMUSCULAR | Status: DC
  Administered 2023-07-21: 3 [IU] via SUBCUTANEOUS
  Administered 2023-07-21: 5 [IU] via SUBCUTANEOUS
  Administered 2023-07-22: 3 [IU] via SUBCUTANEOUS
  Administered 2023-07-22: 11 [IU] via SUBCUTANEOUS
  Administered 2023-07-22: 3 [IU] via SUBCUTANEOUS

## 2023-07-21 MED ORDER — TECHNETIUM TO 99M ALBUMIN AGGREGATED
4.3000 | Freq: Once | INTRAVENOUS | Status: AC | PRN
  Administered 2023-07-21: 4.3 via INTRAVENOUS

## 2023-07-21 NOTE — Plan of Care (Signed)
  Problem: Fluid Volume: Goal: Ability to maintain a balanced intake and output will improve Outcome: Progressing   Problem: Health Behavior/Discharge Planning: Goal: Ability to manage health-related needs will improve Outcome: Progressing   Problem: Skin Integrity: Goal: Risk for impaired skin integrity will decrease Outcome: Progressing   Problem: Education: Goal: Knowledge of General Education information will improve Description: Including pain rating scale, medication(s)/side effects and non-pharmacologic comfort measures Outcome: Progressing   Problem: Clinical Measurements: Goal: Ability to maintain clinical measurements within normal limits will improve Outcome: Progressing Goal: Will remain free from infection Outcome: Progressing   Problem: Activity: Goal: Risk for activity intolerance will decrease Outcome: Progressing   Problem: Nutrition: Goal: Adequate nutrition will be maintained Outcome: Progressing   Problem: Coping: Goal: Level of anxiety will decrease Outcome: Progressing   Problem: Safety: Goal: Ability to remain free from injury will improve Outcome: Progressing   Problem: Education: Goal: Ability to demonstrate management of disease process will improve Outcome: Progressing Goal: Ability to verbalize understanding of medication therapies will improve Outcome: Progressing   Problem: Activity: Goal: Capacity to carry out activities will improve Outcome: Progressing

## 2023-07-21 NOTE — Progress Notes (Signed)
 SATURATION QUALIFICATIONS: (This note is used to comply with regulatory documentation for home oxygen )  Patient Saturations on Room Air at Rest = 94%  Patient Saturations on Room Air while Ambulating = 86%  Patient Saturations on 2 Liters of oxygen  while Ambulating = 90%  Please briefly explain why patient needs home oxygen :Pt desaturates on RA with activity. May benefit from home O2 with activity. Chase Knebel M,PT Acute Rehab Services [phone]

## 2023-07-21 NOTE — Progress Notes (Signed)
 Patient had 7 beats vtach at 2051. Patient asymptomatic and vss. Dr. Brice Campi aware and orders for potassium placed.

## 2023-07-21 NOTE — Inpatient Diabetes Management (Addendum)
 Inpatient Diabetes Program Recommendations  AACE/ADA: New Consensus Statement on Inpatient Glycemic Control (2015)  Target Ranges:  Prepandial:   less than 140 mg/dL      Peak postprandial:   less than 180 mg/dL (1-2 hours)      Critically ill patients:  140 - 180 mg/dL   Lab Results  Component Value Date   GLUCAP 75 07/21/2023   HGBA1C 8.2 (H) 05/22/2023    Review of Glycemic Control  Latest Reference Range & Units 07/21/23 01:10 07/21/23 04:36 07/21/23 07:47  Glucose-Capillary 70 - 99 mg/dL 161 (H) 096 (H) 75   Diabetes history: Type 2 DM Outpatient Diabetes medications:  Toujeo  10 units q HS Current orders for Inpatient glycemic control:  Novolog  0-15 units q 4 hours Jardiance  10 mg daily Semglee  10 units daily  Inpatient Diabetes Program Recommendations:    Consider reducing Novolog  (0-15 units) correction to tid with meals and HS scale (instead of q 4 hours).  Will speak to patient today.   Addendum:  Spoke with patient regarding DM management.  She states that she is taking insulin  but blood sugars are usually in the 200's. Last A1C in December was 8.2%.  Patient states she has a meter.  She does not have a PCP but does have a visit scheduled at the  [facility] on Feb. 18th.  We discussed the importance of improved control of her DM and close follow-up.  Discussed that blood sugars are not at goal.  Patient states she is tired today and just wants a nap.  I instructed her to let her RN know if she has any further questions and that her medications will likely be adjusted at discharge.  Patient verbalized understanding.    Thanks  Josefa Ni, RN, BC-ADM Inpatient Diabetes Coordinator Pager [phone]  (8a-5p)

## 2023-07-21 NOTE — Progress Notes (Addendum)
 Patient Name: [name] [name] Date of Encounter: 07/21/2023 [facility] Cardiologist: Sheryle Donning, MD   Interval Summary  .    Patient comfortable but anxious appearing in bed. Reports that she continues to have orthopnea. Has not really ambulated enough since admission to assess exertional dyspnea. She feels that her breathing might be slightly improved today but at the same time confirms minimal urine output with diuresis.   Vital Signs .    Vitals:   07/20/23 2020 07/20/23 2123 07/21/23 0425 07/21/23 0750  BP: 117/71  (!) 104/55 122/75  Pulse:   90 91  Resp:   18 20  Temp:   98.9 F (37.2 C) 98.9 F (37.2 C)  TempSrc:   Oral Oral  SpO2:  91% 94% 95%  Weight:   71.7 kg   Height:        Intake/Output Summary (Last 24 hours) at 07/21/2023 0753 Last data filed at 07/21/2023 0751 Gross per 24 hour  Intake 418.46 ml  Output 900 ml  Net -481.54 ml      07/21/2023    4:25 AM 07/20/2023    4:20 AM 07/19/2023   12:35 PM  Last 3 Weights  Weight (lbs) 158 lb 159 lb 9.6 oz 164 lb 8 oz  Weight (kg) 71.668 kg 72.394 kg 74.617 kg      Telemetry/ECG    Sinus rhythm with isolated runs of NSVT less than 7 beats - Personally Reviewed  Physical Exam .   GEN: No acute distress.   Neck: Moderate elevation of JVP Cardiac: RRR, 3/6 murmur over apex Respiratory: Upper lobe inspiratory rhonchi/wheezing. Lower lobes diminished bilaterally with faint crackles.  GI: Soft, nontender, non-distended  MS: No edema  Assessment & Plan .     Patient has a history of chronic HFrEF (EF 20-25%), COPD, tobacco use, DM, anxiety, and bipolar disorder. Cardiology consulted for assistance in managing acute on chronic HFrEF.    Acute on chronic HFrEF Patient with LVEF 20-25% presented to the ED on 2/7 reporting 3 days of dyspnea on exertion and orthopnea. She described breathlessness with simply walking across the room and waking up gasping for air at night. Admission labs notable  for BNP 4443, elevated liver enzymes and T-bili. Through 2/9, patient with poor urine output despite high bolus dose lasix  and lasix  infusion. Dr. Mallipeddi who rounded on patient over the weekend curbsided AHF team who recommended PICC line for Co-Ox evaluation and CVP. Lactic acid 1.2, Co-Ox with O2 saturation 63.  Difficult situation. Patient previously a patient at [facility] where she was followed by the AHF team there, felt to not be a candidate for advanced therapies due to multiple psychosocial challenges including tobacco use, schizoaffective disorder, chronic pain disorder, homelessness.  Net I/O for this admission only 59.52mL. Patient apparently with 2.8L oral intake on 2/8. Co-ox from 2/9 showed O2 saturation 63. Although patient has warm distal extremities and normal lactic acid, I wonder if she would benefit from temporary Milrinone supported diuresis. Creatinine improved today to 0.96. JVP is elevated. No LE edema noted on exam but abdomen reported as distended from baseline by patient. Co-Ox appears to be down-trending, CVP reported as 7. If CVP accurate, would not diurese and then the question would be does she need/could she tolerate afterload reduction. Will discuss further with Dr. Veryl Gottron. Question whether she would benefit from RHC. Repeat limited echo to assess right heart pressures.  BP limits GDMT, no BB, MRA, ACEi/ARB/ARNI Continue SGLT2 Recommend ongoing goals  of care conversations  Hypertension Patient with low BP this admission, is now receiving Midodrine  per primary team orders. No beta blocker with hypotension and concern for low output HF.   Severe MR 05/24/13 TTE with severe MR, mean gradient . Poor surgical candidate and not clear that valve is amenable to mitra clip.   CKD IIIa Creatinine fluctuant this admission. 1.0->1.2->1.75->0.96. Continue to follow closely this admission.   Per primary team: DM type II Chronic pain Bipolar disorder COPD  For  questions or updates, please contact [facility] Please consult [email or web address] for contact info under        Signed, Leala Prince, PA-C

## 2023-07-21 NOTE — Evaluation (Signed)
 Physical Therapy Evaluation Patient Details Name: [name] [name] MRN: [id number] DOB: 1971/02/07 Today's Date: 07/21/2023  History of Present Illness  53 yo female who presents to [facility] on 07/18/23 with working dx of acute CHF. Recently admitted with chest pain, cough, and wheezing on 07/05/23. PMH: COPD, Asthma, T2DM, depression, bipolar disorder, heart failure and chronic pain syndrome, CKD III  Clinical Impression  Pt admitted with above diagnosis. Pt not orthostatic during session. Pt O2 saturation was 94% on RA at rest.  Pt did desaturate to 86% on RA with standing and reported dizziness.  Pt reports she felt too tired to go for walk therefore marching in place.  Pursed lip breathing improved pts O2 > 88%. Should progress to home with HHPT and recommend rollator and pulse oximeter. May need O2.  Pt currently with functional limitations due to the deficits listed below (see PT Problem List). Pt will benefit from acute skilled PT to increase their independence and safety with mobility to allow discharge.           If plan is discharge home, recommend the following: A little help with walking and/or transfers;A little help with bathing/dressing/bathroom;Help with stairs or ramp for entrance;Assist for transportation   Can travel by private vehicle        Equipment Recommendations Rollator (4 wheels), pulse oximeter, ? Home O2  Recommendations for Other Services       Functional Status Assessment Patient has had a recent decline in their functional status and demonstrates the ability to make significant improvements in function in a reasonable and predictable amount of time.     Precautions / Restrictions Precautions Precautions: Fall Precaution Comments: soft BP, reports vision impairments from progressive diabetes Restrictions Weight Bearing Restrictions Per Provider Order: No      Mobility  Bed Mobility   Bed Mobility: Supine to Sit     Supine to sit: Supervision     General  bed mobility comments: HOB elevated no difficulty    Transfers Overall transfer level: Needs assistance Equipment used: None Transfers: Sit to/from Stand Sit to Stand: Supervision           General transfer comment: supervision for safety    Ambulation/Gait               General Gait Details: NT as pt reports she is in pain and desaturating to 86% on RA with standing activity.  Stairs            Wheelchair Mobility     Tilt Bed    Modified Rankin (Stroke Patients Only)       Balance Overall balance assessment: Needs assistance Sitting-balance support: No upper extremity supported, Feet supported Sitting balance-Leahy Scale: Fair     Standing balance support: No upper extremity supported, During functional activity Standing balance-Leahy Scale: Fair                               Pertinent Vitals/Pain Pain Assessment Pain Assessment: Faces Faces Pain Scale: Hurts whole lot Breathing: normal Pain Location: chest (sternal regioin) Pain Descriptors / Indicators: Grimacing, Guarding, Discomfort Pain Intervention(s): Limited activity within patient's tolerance, Monitored during session, Repositioned, Patient requesting pain meds-RN notified    Home Living Family/patient expects to be discharged to:: Private residence Living Arrangements: Non-relatives/Friends Available Help at Discharge: Friend(s);Available PRN/intermittently Type of Home: House Home Access: Stairs to enter Entrance Stairs-Rails: Right Entrance Stairs-Number of Steps: 2   Home Layout:  One level Home Equipment: None Additional Comments: pt reports staying with her friend since July, prior to that pt was homeless    Prior Function Prior Level of Function : Needs assist             Mobility Comments: indep without AD, however doesn't drive due to impaired vision, doesn't work, and provides assist to her friends husband ADLs Comments: indep     Extremity/Trunk  Assessment   Upper Extremity Assessment Upper Extremity Assessment: Defer to OT evaluation    Lower Extremity Assessment Lower Extremity Assessment: Generalized weakness    Cervical / Trunk Assessment Cervical / Trunk Assessment: Normal  Communication   Communication Communication: No apparent difficulties  Cognition Arousal: Alert Behavior During Therapy: WFL for tasks assessed/performed Overall Cognitive Status: Within Functional Limits for tasks assessed                                 General Comments: pt oriented x4, self limiting        General Comments      Exercises General Exercises - Lower Extremity Ankle Circles/Pumps: AROM, Both, 10 reps, Seated Long Arc Quad: AROM, Both, 10 reps, Seated   Assessment/Plan    PT Assessment Patient needs continued PT services  PT Problem List Decreased strength;Decreased activity tolerance;Decreased mobility;Decreased cognition;Cardiopulmonary status limiting activity       PT Treatment Interventions Gait training;Stair training;DME instruction;Functional mobility training;Balance training;Therapeutic exercise;Therapeutic activities    PT Goals (Current goals can be found in the Care Plan section)  Acute Rehab PT Goals Patient Stated Goal: home PT Goal Formulation: With patient Time For Goal Achievement: 08/04/23 Potential to Achieve Goals: Good    Frequency Min 1X/week     Co-evaluation               AM-PAC PT "6 Clicks" Mobility  Outcome Measure Help needed turning from your back to your side while in a flat bed without using bedrails?: A Little Help needed moving from lying on your back to sitting on the side of a flat bed without using bedrails?: A Little Help needed moving to and from a bed to a chair (including a wheelchair)?: A Little Help needed standing up from a chair using your arms (e.g., wheelchair or bedside chair)?: A Little Help needed to walk in hospital room?: A Little Help  needed climbing 3-5 steps with a railing? : A Lot 6 Click Score: 17    End of Session Equipment Utilized During Treatment: Gait belt Activity Tolerance: Patient limited by fatigue Patient left: with call bell/phone within reach;in bed;with bed alarm set Nurse Communication: Mobility status;Patient requests pain meds PT Visit Diagnosis: Unsteadiness on feet (R26.81);Muscle weakness (generalized) (M62.81)    Time: 1610-9604 PT Time Calculation (min) (ACUTE ONLY): 14 min   Charges:   PT Evaluation $PT Eval Low Complexity: 1 Low   PT General Charges $$ ACUTE PT VISIT: 1 Visit         Finbar Nippert M,PT Acute Rehab Services [phone]   Florencia Hunter 07/21/2023, 4:24 PM

## 2023-07-21 NOTE — Evaluation (Signed)
 Occupational Therapy Evaluation Patient Details Name: [name] [name] MRN: [id number] DOB: November 26, 1970 Today's Date: 07/21/2023   History of Present Illness 53 yo female who presents to [facility] on 07/18/23 with working dx of acute CHF. Recently admitted with chest pain, cough, and wheezing on 07/05/23. PMH: COPD, Asthma, T2DM, depression, bipolar disorder, heart failure and chronic pain syndrome, CKD III   Clinical Impression   Pt admitted for above, this is her second admission for chest pain and coughing secondary to acute CHF. Educated pt on energy conservation strategies and reinforced HF diet restrictions and prevention strategies to which she admits to not being fully compliant with. Pt overall supervision no AD for room level ambulation and completing ADLs with supervision to Ind level. Encouraged activity at DC to promote a heart healthy lifestyle. Pt has no further acute skilled OT needs, no post acute OT indicated.       If plan is discharge home, recommend the following: Other (comment) (prn)    Functional Status Assessment  Patient has not had a recent decline in their functional status  Equipment Recommendations  None recommended by OT    Recommendations for Other Services       Precautions / Restrictions Precautions Precautions: Fall      Mobility Bed Mobility Overal bed mobility: Modified Independent                  Transfers Overall transfer level: Needs assistance Equipment used: None Transfers: Sit to/from Stand Sit to Stand: Supervision           General transfer comment: supervision for safety      Balance Overall balance assessment: Mild deficits observed, not formally tested                                         ADL either performed or assessed with clinical judgement   ADL Overall ADL's : Needs assistance/impaired Eating/Feeding: Independent;Sitting   Grooming: Standing;Supervision/safety   Upper Body Bathing:  Standing;Supervision/ safety   Lower Body Bathing: Supervison/ safety;Sit to/from stand   Upper Body Dressing : Standing;Independent   Lower Body Dressing: Sitting/lateral leans;Independent   Toilet Transfer: Administrator, arts Details (indicate cue type and reason): supervision for safety Toileting- Clothing Manipulation and Hygiene: Supervision/safety;Sit to/from stand Toileting - Clothing Manipulation Details (indicate cue type and reason): supervision for safety     Functional mobility during ADLs: Supervision/safety General ADL Comments: Reinforced CHF diet/prevention strategies. Educated pt on 5ps of energy conservation, left handout     Vision   Vision Assessment?: Vision impaired- to be further tested in functional context     Perception         Praxis         Pertinent Vitals/Pain Pain Assessment Pain Assessment: 0-10 Pain Score: 4  Pain Location: chest (sternal regioin) Pain Descriptors / Indicators: Stabbing Pain Intervention(s): Monitored during session, Premedicated before session, Repositioned     Extremity/Trunk Assessment Upper Extremity Assessment Upper Extremity Assessment: Overall WFL for tasks assessed   Lower Extremity Assessment Lower Extremity Assessment: Defer to PT evaluation       Communication Communication Communication: No apparent difficulties   Cognition Arousal: Alert Behavior During Therapy: WFL for tasks assessed/performed Overall Cognitive Status: Within Functional Limits for tasks assessed  General Comments  Pt Sp02 noted to say 88% on digital pulse ox after room level ambulation, rebounded back >92% following standing pursed lip breathing.    Exercises     Shoulder Instructions      Home Living Family/patient expects to be discharged to:: Private residence Living Arrangements: Non-relatives/Friends Available Help at Discharge:  Friend(s);Available PRN/intermittently Type of Home: House Home Access: Stairs to enter [employer] of Steps: 2 Entrance Stairs-Rails: Right Home Layout: One level     Bathroom Shower/Tub: Chief Strategy Officer: Standard     Home Equipment: None          Prior Functioning/Environment Prior Level of Function : Needs assist             Mobility Comments: indep without AD, however doesn't drive due to impaired vision, doesn't work, and provides assist to her friends husband ADLs Comments: indep        OT Problem List: Pain      OT Treatment/Interventions:      OT Goals(Current goals can be found in the care plan section) Acute Rehab OT Goals Patient Stated Goal: To go home-get better OT Goal Formulation: All assessment and education complete, DC therapy Time For Goal Achievement: 08/04/23 Potential to Achieve Goals: Good  OT Frequency:      Co-evaluation              AM-PAC OT "6 Clicks" Daily Activity     Outcome Measure Help from another person eating meals?: None Help from another person taking care of personal grooming?: A Little Help from another person toileting, which includes using toliet, bedpan, or urinal?: A Little Help from another person bathing (including washing, rinsing, drying)?: A Little Help from another person to put on and taking off regular upper body clothing?: None Help from another person to put on and taking off regular lower body clothing?: None 6 Click Score: 21   End of Session Nurse Communication: Mobility status  Activity Tolerance: Patient tolerated treatment well Patient left: in bed;with call bell/phone within reach  OT Visit Diagnosis: Pain Pain - Right/Left:  (chest)                Time: 4098-1191 OT Time Calculation (min): 18 min Charges:  OT General Charges $OT Visit: 1 Visit OT Evaluation $OT Eval Low Complexity: 1 Low  07/21/2023  AB, OTR/L  Acute Rehabilitation  Services  Office: [phone]   [name] [name] 07/21/2023, 10:08 AM

## 2023-07-21 NOTE — Progress Notes (Signed)
 Heart Failure Navigator Progress Note  Assessed for Heart & Vascular TOC clinic readiness.  Patient does not meet criteria due to Advanced Heart Failure Team patient of Dr. Gasper Lloyd.   Navigator will sign off at this time.   Rhae Hammock, BSN, Scientist, clinical (histocompatibility and immunogenetics) Only

## 2023-07-21 NOTE — Progress Notes (Signed)
 Limited 2D Echocardiogram performed.

## 2023-07-21 NOTE — Progress Notes (Signed)
 Progress Note   Patient: [name] [name] UJW:[id number] DOB: April 21, 1971 DOA: 07/18/2023     3 DOS: the patient was seen and examined on 07/21/2023   Brief hospital course: Mrs. Fradella was admitted to the hospital with the working diagnosis of acute on chronic systolic heart failure.   53 yo female with the past medical history of heart failure, CKD, T2DM, COPD, bipolar and chronic pain syndrome who presented with dyspnea for the last 5 to 6 days prior to admission. Positive dyspnea on exertion, PND and orthopnea.   On her initial physical examination her blood pressure was 116/40, HR 107, RR 29 and 02 saturation 100% on 2 L/min per [place].  Lungs with no wheezing or rhonchi, heart with S1 and S2 present and regular with no gallops, rubs or murmurs, abdomen with no distention and positive lower extremity edema.   Na 133, K 4,4 CL 101, bicarbonate 19, glucose 648, bun 20 cr 1,0 anion gap 13  VBG 7,42/ 32.9/ 52/ 88%  BNP 4,443  High sensitive troponin 53 and 54   Wbc 7,3 hgb 12.1 plt 211  Sars covid 19 negative  Influenza negative   Chest radiograph with cardiomegaly with bilateral hilar vascular congestion, cephalization of the vasculature, no effusions or infiltrates.   EKG 110, right axis deviation, qtc 492, sinus rhythm with bilateral atrial enlargement, no significant ST segment changes, negative T wave V5 and V6, positive LVH.   02/09 despite diuresis, she continue with dyspnea on exertion. Intermittent chest pain improved with analgesics.  02/10 clinically with euvolemic state. Pending final cardiology recommendations.   Assessment and Plan: * Acute on chronic systolic CHF (congestive heart failure) (HCC) Echocardiogram with reduced LV systolic function EF 20 to 25%, global hypokinesis, LV cavity dilatated, RV systolic function preserved, severe mitral valve regurgitation. RVSP 37,4 mmHg,   Acute on chronic core pulmonale Pulmonary hypertension.   Systolic blood pressure 100 mmHg.   Urine output is 900 ml  Blood pressure support with midodrine .  Continue with SGLT 2 inh.  Limited pharmacologic therapy due to risk of hypotension.  Holding loop diuretic for now.  Non sustained VT,  Keep K at 4 and Mg at 2.  Continue telemetry monitoring.   Patient had CT chest angiography in 05/2023 negative for pulmonary embolism. Will get V/Q scan to rule out chronic thromboembolic disease.   COPD (chronic obstructive pulmonary disease) (HCC) No signs of acute exacerbation.  Continue with bronchodilator therapy.  Inhaled corticosteroid.  She has chronic wheezing.   CKD stage 3a, GFR 45-59 ml/min (HCC) Hyponatremia. AKI (baseline cr 1,3)   Renal function has improved with serum cr at 0,96 with K at 3,7 and serum bicarbonate at 33 Na 136 Mg 2,0   Continue to hold on diuretic therapy for now.  Follow up renal function and electrolytes in am.   DM2 (diabetes mellitus, type 2) (HCC) Uncontrolled hyperglycemia.  Glucose this am fasting 298 mg/dl   Plan to continue insulin  for glucose cover and monitoring.  Continue basal insulin .   Dyslipidemia, continue with statin therapy.   HTN (hypertension) Continue blood pressure support with midodrine    Chronic pain syndrome Continue with oxycodone .   Bipolar 1 disorder (HCC) Continue with sertraline .         Subjective: Patient with intermittent chest pain improved with analgesics, she reports dyspnea on exertion, no PND, orthopnea or lower extremity edema   Physical Exam: Vitals:   07/20/23 2020 07/20/23 2123 07/21/23 0425 07/21/23 0750  BP: 117/71  Aaron Aas)  104/55 122/75  Pulse:   90 91  Resp:   18 20  Temp:   98.9 F (37.2 C) 98.9 F (37.2 C)  TempSrc:   Oral Oral  SpO2:  91% 94% 95%  Weight:   71.7 kg   Height:       Neurology awake and alert ENT with mild pallor Cardiovascular with S1 and S2 present and regular with no gallops, or rubs, positive systolic murmur at the apex No JVD No lower extremity  edema Respiratory with no rales or rhonchi, mild expiratory wheezing Abdomen with no distention  Data Reviewed:    Family Communication: no family at the bedside   Disposition: Status is: Inpatient Remains inpatient appropriate because: cardiac work up   Planned Discharge Destination: Home      Author: Albertus Alt, MD 07/21/2023 9:37 AM  For on call review [email or web address].

## 2023-07-22 ENCOUNTER — Inpatient Hospital Stay (HOSPITAL_COMMUNITY): Payer: MEDICAID

## 2023-07-22 DIAGNOSIS — G894 Chronic pain syndrome: Secondary | ICD-10-CM | POA: Diagnosis not present

## 2023-07-22 DIAGNOSIS — Z794 Long term (current) use of insulin: Secondary | ICD-10-CM

## 2023-07-22 DIAGNOSIS — N1831 Chronic kidney disease, stage 3a: Secondary | ICD-10-CM

## 2023-07-22 DIAGNOSIS — E119 Type 2 diabetes mellitus without complications: Secondary | ICD-10-CM | POA: Diagnosis not present

## 2023-07-22 DIAGNOSIS — J42 Unspecified chronic bronchitis: Secondary | ICD-10-CM

## 2023-07-22 DIAGNOSIS — R0600 Dyspnea, unspecified: Secondary | ICD-10-CM | POA: Diagnosis not present

## 2023-07-22 DIAGNOSIS — I159 Secondary hypertension, unspecified: Secondary | ICD-10-CM

## 2023-07-22 DIAGNOSIS — F319 Bipolar disorder, unspecified: Secondary | ICD-10-CM

## 2023-07-22 DIAGNOSIS — I5023 Acute on chronic systolic (congestive) heart failure: Secondary | ICD-10-CM

## 2023-07-22 LAB — BASIC METABOLIC PANEL
Anion gap: 9 (ref 5–15)
BUN: 25 mg/dL — ABNORMAL HIGH (ref 6–20)
CO2: 28 mmol/L (ref 22–32)
Calcium: 7.9 mg/dL — ABNORMAL LOW (ref 8.9–10.3)
Chloride: 105 mmol/L (ref 98–111)
Creatinine, Ser: 1.04 mg/dL — ABNORMAL HIGH (ref 0.44–1.00)
GFR, Estimated: >60 mL/min (ref >60)
Glucose, Bld: 317 mg/dL — ABNORMAL HIGH (ref 70–99)
Potassium: 3.8 mmol/L (ref 3.5–5.1)
Sodium: 142 mmol/L (ref 135–145)

## 2023-07-22 LAB — GLUCOSE, CAPILLARY
Glucose-Capillary: 154 mg/dL — ABNORMAL HIGH (ref 70–99)
Glucose-Capillary: 346 mg/dL — ABNORMAL HIGH (ref 70–99)
Glucose-Capillary: 173 mg/dL — ABNORMAL HIGH (ref 70–99)
Glucose-Capillary: 222 mg/dL — ABNORMAL HIGH (ref 70–99)

## 2023-07-22 LAB — COOXEMETRY PANEL
Carboxyhemoglobin: 2.4 % — ABNORMAL HIGH (ref 0.5–1.5)
Methemoglobin: 1.2 % (ref 0.0–1.5)
O2 Saturation: 78.8 %
Total hemoglobin: 11.3 g/dL — ABNORMAL LOW (ref 12.0–16.0)

## 2023-07-22 MED ORDER — INSULIN GLARGINE-YFGN 100 UNIT/ML ~~LOC~~ SOLN
12.0000 [IU] | Freq: Every day | SUBCUTANEOUS | Status: DC
  Administered 2023-07-22: 12 [IU] via SUBCUTANEOUS
  Filled 2023-07-22 (×2): qty 0.12

## 2023-07-22 MED ORDER — BUMETANIDE 2 MG PO TABS: 2.0000 mg | ORAL_TABLET | Freq: Every day | ORAL | Status: DC

## 2023-07-22 MED ORDER — BUMETANIDE 1 MG PO TABS
1.0000 mg | ORAL_TABLET | Freq: Every day | ORAL | Status: DC
  Administered 2023-07-22 – 2023-07-23 (×2): 1 mg via ORAL
  Filled 2023-07-22 (×3): qty 1

## 2023-07-22 MED ORDER — INSULIN ASPART 100 UNIT/ML IJ SOLN
0.0000 [IU] | Freq: Three times a day (TID) | INTRAMUSCULAR | Status: DC
  Administered 2023-07-23 (×2): 5 [IU] via SUBCUTANEOUS

## 2023-07-22 MED ORDER — INSULIN ASPART 100 UNIT/ML IJ SOLN
0.0000 [IU] | Freq: Every day | INTRAMUSCULAR | Status: DC
  Administered 2023-07-22: 2 [IU] via SUBCUTANEOUS

## 2023-07-22 MED ORDER — INSULIN ASPART 100 UNIT/ML IJ SOLN: 0.0000 [IU] | Freq: Three times a day (TID) | INTRAMUSCULAR | Status: DC

## 2023-07-22 NOTE — Progress Notes (Signed)
Received call for 17 beat run of SVT. Patient asymptomatic, sleeping in bed at the time. Did state some episodes where she feels she has to catch her breath while sleeping. Discussed possibility of sleep apnea. Raised bed some and patient resting better. Continues endorsing high levels of pain. Educating patient on preferred use of oxycodone vs IV dilaudid.

## 2023-07-22 NOTE — Inpatient Diabetes Management (Signed)
Inpatient Diabetes Program Recommendations  AACE/ADA: New Consensus Statement on Inpatient Glycemic Control (2015)  Target Ranges:  Prepandial:   less than 140 mg/dL      Peak postprandial:   less than 180 mg/dL (1-2 hours)      Critically ill patients:  140 - 180 mg/dL   Lab Results  Component Value Date   GLUCAP 154 (H) 07/22/2023   HGBA1C 8.2 (H) 05/22/2023    Review of Glycemic Control  Latest Reference Range & Units 07/21/23 07:47 07/21/23 11:33 07/21/23 16:06 07/21/23 20:59 07/22/23 07:20  Glucose-Capillary 70 - 99 mg/dL 75 244 (H) 010 (H) 272 (H) 154 (H)   Diabetes history: DM 2 Outpatient Diabetes medications:  Toujeo 10 units q HS Current orders for Inpatient glycemic control:  Novolog 0-15 units tid with meals and HS Jardiance 10 mg daily Semglee 10 units q HS Inpatient Diabetes Program Recommendations:   Consider changing bedtime coverage to HS scale (0-5 units)- Also consider increasing Semglee 12 units daily.   Thanks,  Lorenza Cambridge, RN, BC-ADM Inpatient Diabetes Coordinator Pager [phone]  (8a-5p)

## 2023-07-22 NOTE — Progress Notes (Addendum)
Progress Note   Patient: [name] [name] PIR:[id number] DOB: 1971-02-02 DOA: 07/18/2023     4 DOS: the patient was seen and examined on 07/22/2023   Brief hospital course: [name] [name] was admitted to the hospital with the working diagnosis of acute on chronic systolic heart failure.   53 yo female with the past medical history of heart failure, CKD, T2DM, COPD, bipolar and chronic pain syndrome who presented with dyspnea for the last 5 to 6 days prior to admission. Positive dyspnea on exertion, PND and orthopnea.   On her initial physical examination her blood pressure was 116/40, HR 107, RR 29 and 02 saturation 100% on 2 L/min per [place].  Lungs with no wheezing or rhonchi, heart with S1 and S2 present and regular with no gallops, rubs or murmurs, abdomen with no distention and positive lower extremity edema.   Na 133, K 4,4 CL 101, bicarbonate 19, glucose 648, bun 20 cr 1,0 anion gap 13  VBG 7,42/ 32.9/ 52/ 88%  BNP 4,443  High sensitive troponin 53 and 54   Wbc 7,3 hgb 12.1 plt 211  Sars covid 19 negative  Influenza negative   Chest radiograph with cardiomegaly with bilateral hilar vascular congestion, cephalization of the vasculature, no effusions or infiltrates.   EKG 110, right axis deviation, qtc 492, sinus rhythm with bilateral atrial enlargement, no significant ST segment changes, negative T wave V5 and V6, positive LVH.   02/09 despite diuresis, she continue with dyspnea on exertion. Intermittent chest pain improved with analgesics.  02/10 clinically with euvolemic state. Pending final cardiology recommendations.  V/Q scan low probability for pulmonary embolism.  02/11 resume oral loop diuretic, if improvement of her symptoms, possible discharge tomorrow.   Assessment and Plan: * Acute on chronic systolic CHF (congestive heart failure) (HCC) Echocardiogram with reduced LV systolic function EF 20 to 25%, global hypokinesis, LV cavity dilatated, RV systolic function preserved,  severe mitral valve regurgitation. RVSP 37,4 mmHg,   Acute on chronic core pulmonale Pulmonary hypertension.   Urine output 2,600 ml Systolic blood pressure 115 mmHg range.   Blood pressure support with midodrine.  Continue with SGLT 2 inh and resume bumetanide 1 mg daily.  Limited pharmacologic therapy due to risk of hypotension.   Non sustained VT,  Keep K at 4 and Mg at 2.  Continue telemetry monitoring.   Patient had CT chest angiography in 05/2023 negative for pulmonary embolism. V/Q scan negative for chronic thromboembolic disease.   Acute hypoxemic respiratory failure due to cardiogenic pulmonary edema, has resolved. 02 saturation today is 95% on room air.  Dyspnea has been persistent possibly not cardiac or pulmonary in origin.  If symptoms improved, plan is for discharge home tomorrow.   COPD (chronic obstructive pulmonary disease) (HCC) No signs of acute exacerbation.  Continue with bronchodilator therapy.  Inhaled corticosteroid.  She has chronic wheezing.   CKD stage 3a, GFR 45-59 ml/min (HCC) Hyponatremia. AKI (baseline cr 1,3)   Renal function with serum cr at 1,0 with K at 3,8 and serum bicarbonate at 28  Na 142   Resume bumetanide today Follow up renal function and electrolytes in am.   DM2 (diabetes mellitus, type 2) (HCC) Uncontrolled hyperglycemia.  Glucose this am fasting 317 mg/dl   Plan to continue insulin for glucose cover and monitoring.  Increase basal insulin to 12 units and will add at bedtime coverage.   Dyslipidemia, continue with statin therapy.   HTN (hypertension) Continue blood pressure support with midodrine  Chronic pain syndrome Continue with oxycodone.   Bipolar 1 disorder (HCC) Continue with sertraline.    Subjective: Patient reports continue to have dyspnea on exertion, no chest pain, no lower extremity edema, no PND or orthopnea,   Physical Exam: Vitals:   07/22/23 0522 07/22/23 0722 07/22/23 0817 07/22/23 1212   BP: 132/75 125/80  113/70  Pulse: (!) 108 (!) 103 (!) 110 (!) 104  Resp: 16 19 20 19   Temp: 98.3 F (36.8 C) 98.7 F (37.1 C)  98.5 F (36.9 C)  TempSrc: Oral Oral  Oral  SpO2: 97% 91% 95% 95%  Weight: 70.7 kg     Height:       Neurology awake and alert ENT with mild pallor Cardiovascular with S1 and S2 present and regular with no gallops or rubs, positive systolic murmur at the apex No JVD No lower extremity edema  Respiratory with no rales or wheezing, no rhonchi  Abdomen with no distention   Data Reviewed:    Family Communication: no family at the bedside   Disposition: Status is: Inpatient Remains inpatient appropriate because: recovering heart failure   Planned Discharge Destination: Home     Author: Coralie Keens, MD 07/22/2023 1:19 PM  For on call review [email or web address].

## 2023-07-22 NOTE — Progress Notes (Signed)
Patient Name: [name] [name] Date of Encounter: 07/22/2023  [facility] Cardiologist: Jodelle Red, MD   Interval Summary  .    Patient reports increased dyspnea overnight with worsened orthopnea. She expresses frustration that she's not getting better and has questions about why more hasn't been done in terms of advanced therapies.   Vital Signs .    Vitals:   07/21/23 1610 07/21/23 2010 07/22/23 0522 07/22/23 0722  BP: 119/65 135/78 132/75 125/80  Pulse: 95 (!) 102 (!) 108 (!) 103  Resp: 16 18 16 19   Temp: 98.9 F (37.2 C) 98.8 F (37.1 C) 98.3 F (36.8 C) 98.7 F (37.1 C)  TempSrc: Oral Oral Oral Oral  SpO2: 92% 97% 97% 91%  Weight:   70.7 kg   Height:        Intake/Output Summary (Last 24 hours) at 07/22/2023 0727 Last data filed at 07/22/2023 0400 Gross per 24 hour  Intake 1074 ml  Output 2600 ml  Net -1526 ml      07/22/2023    5:22 AM 07/21/2023    4:25 AM 07/20/2023    4:20 AM  Last 3 Weights  Weight (lbs) 155 lb 14.4 oz 158 lb 159 lb 9.6 oz  Weight (kg) 70.716 kg 71.668 kg 72.394 kg      Telemetry/ECG    Sinus rhythm/sinus tachycardia with brief isolated run of SVT - Personally Reviewed  Physical Exam .   GEN: No acute distress.   Neck: JVP visible midway to mandible Cardiac: tachycardic, no murmurs, rubs, or gallops.  Respiratory: Bilateral upper lobe inspiratory wheezing. Lower lobes diminished bilaterally.  GI: Soft, nontender, non-distended  MS: No edema  Assessment & Plan .    Patient has a history of chronic HFrEF (EF 20-25%), COPD, tobacco use, DM, anxiety, and bipolar disorder. Cardiology consulted for assistance in managing acute on chronic HFrEF.    Acute on chronic HFrEF Patient with LVEF 20-25% presented to the ED on 2/7 reporting 3 days of dyspnea on exertion and orthopnea. She described breathlessness with simply walking across the room and waking up gasping for air at night. Admission labs notable for BNP 4443,  elevated liver enzymes and T-bili. Through 2/9, patient with poor urine output despite high bolus dose lasix and lasix infusion. Dr. Jenene Slicker who rounded on patient over the weekend curbsided AHF team who recommended PICC line for Co-Ox evaluation and CVP.   Co-Ox 63->58.8. CVP 7. Limited echo with estimated right atrial pressure of 3 with collapsible IVC. Pressures support that patient is intravascularly euvolemic, would not plan for further diuresis at this time. Will recheck co-ox and CVP today before consideration of repeat IV Lasix. Upper lobe wheezing on exam, duoneb requested. Will repeat CXR.  BP thus far this admission has limited GDMT. Moderate improvement today, could potentially add after-load reducing agent.  Continue SGLT2 Would not plan for RHC at this time. Complex case with patient previously followed at other health systems including [facility] and deemed not a candidate for advanced therapies. She has multiple psychosocial challenges including tobacco use, schizoaffective disorder, chronic pain disorder, prior homelessness. Will reach out to our heart failure team today.  Recommend ongoing goals of care conversations. Patient has a significant degree of misunderstanding/lack of insight on the severity of her heart failure. She seems amenable to the idea of palliative care evaluation.   Labile BP Patient with low BP earlier this admission, requiring Midodrine support. BP now trending up, could consider afterload reduction.  Severe MR  05/24/13 TTE with severe MR, mean gradient . Poor surgical candidate and not clear that valve is amenable to mitra clip.   CKD IIIa BMP pending this morning. Creatinine improved with holding of diuretics, correlates with CVP and RAP that suggest euvolemia.   Per primary team: DM type II Chronic pain Bipolar disorder COPD   For questions or updates, please contact [facility] Please consult [email or web address] for contact info under         Signed, Perlie Gold, PA-C

## 2023-07-22 NOTE — Plan of Care (Signed)

## 2023-07-22 NOTE — Progress Notes (Signed)
Mobility Specialist Progress Note;   07/22/23 1500  Mobility  Activity Refused mobility   Pt declining mobility at this time. Room was completley dark upon entry, pt seemed a bit upset and just asking for rest at this time. Pt left comfortably in bed with all needs met, will f/u as schedule permits.   Caesar Bookman Mobility Specialist Please contact via SecureChat or [employer] [phone]

## 2023-07-23 ENCOUNTER — Other Ambulatory Visit (HOSPITAL_COMMUNITY): Payer: Self-pay

## 2023-07-23 DIAGNOSIS — I5023 Acute on chronic systolic (congestive) heart failure: Secondary | ICD-10-CM | POA: Diagnosis not present

## 2023-07-23 DIAGNOSIS — Z7189 Other specified counseling: Secondary | ICD-10-CM

## 2023-07-23 DIAGNOSIS — R0600 Dyspnea, unspecified: Secondary | ICD-10-CM | POA: Diagnosis not present

## 2023-07-23 LAB — GLUCOSE, CAPILLARY
Glucose-Capillary: 203 mg/dL — ABNORMAL HIGH (ref 70–99)
Glucose-Capillary: 230 mg/dL — ABNORMAL HIGH (ref 70–99)
Glucose-Capillary: 165 mg/dL — ABNORMAL HIGH (ref 70–99)

## 2023-07-23 MED ORDER — EMPAGLIFLOZIN 10 MG PO TABS
10.0000 mg | ORAL_TABLET | Freq: Every day | ORAL | 0 refills | Status: AC
Start: 2023-07-23 — End: 2023-08-22
  Filled 2023-07-23: qty 30, 30d supply, fill #0

## 2023-07-23 NOTE — Progress Notes (Signed)
Rounding Note    Patient Name: [name] [name] Date of Encounter: 07/23/2023  [facility] Cardiologist: Jodelle Red, MD   Subjective   Felt short of breath last night during sleep. Has intermittent chest pain worse with worsening SOB. Afraid of going home.   Inpatient Medications    Scheduled Meds:  bumetanide  1 mg Oral Daily   Chlorhexidine Gluconate Cloth  6 each Topical Daily   empagliflozin  10 mg Oral Daily   enoxaparin (LOVENOX) injection  40 mg Subcutaneous Q24H   insulin aspart  0-15 Units Subcutaneous TID WC   insulin aspart  0-5 Units Subcutaneous QHS   insulin glargine-yfgn  12 Units Subcutaneous QHS   midodrine  10 mg Oral TID WC   mometasone-formoterol  2 puff Inhalation BID   rosuvastatin  20 mg Oral Daily   sertraline  100 mg Oral Daily   sodium chloride flush  10-40 mL Intracatheter Q12H   sodium chloride flush  10-40 mL Intracatheter Q12H   sodium chloride flush  3 mL Intravenous Q12H   Continuous Infusions:  PRN Meds: acetaminophen **OR** acetaminophen, HYDROmorphone (DILAUDID) injection, ipratropium-albuterol, oxyCODONE-acetaminophen **AND** [DISCONTINUED] oxyCODONE, prochlorperazine, sodium chloride flush, sodium chloride flush, sodium chloride flush   Vital Signs    Vitals:   07/22/23 1941 07/23/23 0309 07/23/23 0311 07/23/23 0738  BP: 132/84  124/87 123/80  Pulse: 97     Resp: 16  18 17   Temp: 98 F (36.7 C)  98.1 F (36.7 C) 98.9 F (37.2 C)  TempSrc: Oral  Oral Oral  SpO2:   99% 98%  Weight:  70.6 kg    Height:        Intake/Output Summary (Last 24 hours) at 07/23/2023 0805 Last data filed at 07/23/2023 0200 Gross per 24 hour  Intake 1554 ml  Output 2950 ml  Net -1396 ml      07/23/2023    3:09 AM 07/22/2023    5:22 AM 07/21/2023    4:25 AM  Last 3 Weights  Weight (lbs) 155 lb 9.6 oz 155 lb 14.4 oz 158 lb  Weight (kg) 70.58 kg 70.716 kg 71.668 kg      Telemetry    Sinus tachycardia with HR 90-100s -  Personally Reviewed  ECG    Sinus tachycardia, no significant ST-T wave changes - Personally Reviewed  Physical Exam   GEN: No acute distress.   Neck: No JVD Cardiac: RRR, no murmurs, rubs, or gallops.  Respiratory: wheezing GI: Soft, nontender, non-distended  MS: No edema; No deformity. Neuro:  Nonfocal  Psych: Normal affect   Labs    High Sensitivity Troponin:   Recent Labs  Lab 07/18/23 2343 07/19/23 0109 07/19/23 0514 07/19/23 0838  TROPONINIHS 53* 54* 53* 50*     Chemistry Recent Labs  Lab 07/19/23 0109 07/20/23 0353 07/21/23 0621 07/22/23 0900  NA 136 136 136 142  K 3.8 3.3* 3.7 3.8  CL 104 94* 97* 105  CO2 23 27 33* 28  GLUCOSE 319* 120* 298* 317*  BUN 22* 29* 25* 25*  CREATININE 1.20* 1.75* 0.96 1.04*  CALCIUM 8.1* 7.4* 7.5* 7.9*  MG  --  1.7 2.0  --   PROT 5.2*  --   --   --   ALBUMIN 2.9*  --   --   --   AST 50*  --   --   --   ALT 97*  --   --   --   ALKPHOS 94  --   --   --  BILITOT 1.3*  --   --   --   GFRNONAA 54* 34* >60 >60  ANIONGAP 9 15 6 9     Lipids No results for input(s): "CHOL", "TRIG", "HDL", "LABVLDL", "LDLCALC", "CHOLHDL" in the last 168 hours.  Hematology Recent Labs  Lab 07/18/23 1740 07/18/23 2051 07/19/23 0109  WBC 7.3  --  8.4  RBC 4.34  --  3.96  HGB 12.1 13.6 11.1*  HCT 38.3 40.0 35.9*  MCV 88.2  --  90.7  MCH 27.9  --  28.0  MCHC 31.6  --  30.9  RDW 16.3*  --  16.0*  PLT 211  --  202   Thyroid No results for input(s): "TSH", "FREET4" in the last 168 hours.  BNP Recent Labs  Lab 07/18/23 1740  BNP 4,443.3*    DDimer No results for input(s): "DDIMER" in the last 168 hours.   Radiology    DG CHEST PORT 1 VIEW Result Date: 07/22/2023 CLINICAL DATA:  Dyspnea. EXAM: PORTABLE CHEST 1 VIEW COMPARISON:  07/21/2023 FINDINGS: Tip of the right upper extremity PICC in the SVC. Cardiomegaly is stable. No pulmonary edema. No large pleural effusion. No evidence of focal airspace disease. No pneumothorax. IMPRESSION:  Stable cardiomegaly.  No pulmonary edema or focal pulmonary process. Electronically Signed   By: Narda Rutherford M.D.   On: 07/22/2023 11:51   NM Pulmonary Perfusion Result Date: 07/21/2023 CLINICAL DATA:  Pulmonary hypertension. Congestive heart failure. Shortness of breath. EXAM: NUCLEAR MEDICINE PERFUSION LUNG SCAN TECHNIQUE: Perfusion images were obtained in multiple projections after intravenous injection of radiopharmaceutical. Ventilation scans intentionally deferred if perfusion scan and chest x-ray adequate for interpretation during COVID 19 epidemic. RADIOPHARMACEUTICALS:  4.3 mCi Tc-45m MAA IV COMPARISON:  Chest radiography same day FINDINGS: No perfusion defect to suggest embolic disease. Enlarged cardiac silhouette. No perfusion abnormality not attributable to the cardiomegaly. IMPRESSION: Enlarged cardiac silhouette. Otherwise negative pulmonary perfusion examination. Electronically Signed   By: Paulina Fusi M.D.   On: 07/21/2023 17:08   ECHOCARDIOGRAM LIMITED Result Date: 07/21/2023    ECHOCARDIOGRAM LIMITED REPORT   Patient Name:   [name] [name] Date of Exam: 07/21/2023 Medical Rec #:  [id number]       Height:       65.0 in Accession #:    [id number]      Weight:       158.0 lb Date of Birth:  [birth year]       BSA:          1.790 m Patient Age:    53 years        BP:           112/73 mmHg Patient Gender: F               HR:           88 bpm. Exam Location:  Inpatient Procedure: Limited Echo, Color Doppler, Cardiac Doppler and Intracardiac            Opacification Agent Indications:    CHF  History:        Patient has prior history of Echocardiogram examinations, most                 recent 05/25/2023. CHF, COPD; Risk Factors:Diabetes and                 Hypertension.  Sonographer:    Melton Krebs RDCS, FE, PE Referring Phys: [id number] Perlie Gold IMPRESSIONS  1. Left ventricular ejection fraction, by  estimation, is 20 to 25%. The left ventricle has severely decreased function. The left  ventricle demonstrates global hypokinesis. The left ventricular internal cavity size was severely dilated. There is mild left ventricular hypertrophy.  2. Right ventricular systolic function is moderately reduced. The right ventricular size is normal.  3. The mitral valve is normal in structure. Severe mitral valve regurgitation. No evidence of mitral stenosis.  4. The aortic valve is tricuspid. Aortic valve regurgitation is trivial. No aortic stenosis is present.  5. The inferior vena cava is normal in size with greater than 50% respiratory variability, suggesting right atrial pressure of 3 mmHg. FINDINGS  Left Ventricle: Left ventricular ejection fraction, by estimation, is 20 to 25%. The left ventricle has severely decreased function. The left ventricle demonstrates global hypokinesis. Definity contrast agent was given IV to delineate the left ventricular endocardial borders. The left ventricular internal cavity size was severely dilated. There is mild left ventricular hypertrophy. Right Ventricle: The right ventricular size is normal. Right ventricular systolic function is moderately reduced. Left Atrium: Left atrial size was normal in size. Right Atrium: Right atrial size was normal in size. Pericardium: There is no evidence of pericardial effusion. Mitral Valve: The mitral valve is normal in structure. Severe mitral valve regurgitation. No evidence of mitral valve stenosis. Tricuspid Valve: The tricuspid valve is normal in structure. Tricuspid valve regurgitation is mild . No evidence of tricuspid stenosis. Aortic Valve: The aortic valve is tricuspid. Aortic valve regurgitation is trivial. No aortic stenosis is present. Pulmonic Valve: The pulmonic valve was normal in structure. Pulmonic valve regurgitation is mild. No evidence of pulmonic stenosis. Aorta: The aortic root is normal in size and structure. Venous: The inferior vena cava is normal in size with greater than 50% respiratory variability, suggesting  right atrial pressure of 3 mmHg. IAS/Shunts: There is right bowing of the interatrial septum, suggestive of elevated left atrial pressure. LEFT VENTRICLE PLAX 2D LVIDd:         7.20 cm LVIDs:         6.40 cm LV PW:         1.20 cm LV IVS:        1.20 cm  LV Volumes (MOD) LV vol d, MOD A2C: 248.0 ml LV vol d, MOD A4C: 246.0 ml LV vol s, MOD A2C: 197.0 ml LV vol s, MOD A4C: 186.0 ml LV SV MOD A2C:     51.0 ml LV SV MOD A4C:     246.0 ml LV SV MOD BP:      53.5 ml LEFT ATRIUM         Index LA diam:    5.40 cm 3.02 cm/m  TRICUSPID VALVE TR Peak grad:   42.0 mmHg TR Vmax:        324.00 cm/s Olga Millers MD Electronically signed by Olga Millers MD Signature Date/Time: 07/21/2023/3:17:20 PM    Final    DG CHEST PORT 1 VIEW Result Date: 07/21/2023 CLINICAL DATA:  Shortness of breath EXAM: PORTABLE CHEST 1 VIEW COMPARISON:  07/18/2023 FINDINGS: Cardiac shadow is enlarged but stable. Right-sided PICC is noted in satisfactory position. The lungs are clear bilaterally. No focal infiltrate or effusion is noted. No bony abnormality is seen. IMPRESSION: Stable cardiomegaly.  No acute abnormality noted. Electronically Signed   By: Alcide Clever M.D.   On: 07/21/2023 11:11    Cardiac Studies   Echo 07/21/2023  1. Left ventricular ejection fraction, by estimation, is 20 to 25%. The  left ventricle has  severely decreased function. The left ventricle  demonstrates global hypokinesis. The left ventricular internal cavity size  was severely dilated. There is mild  left ventricular hypertrophy.   2. Right ventricular systolic function is moderately reduced. The right  ventricular size is normal.   3. The mitral valve is normal in structure. Severe mitral valve  regurgitation. No evidence of mitral stenosis.   4. The aortic valve is tricuspid. Aortic valve regurgitation is trivial.  No aortic stenosis is present.   5. The inferior vena cava is normal in size with greater than 50%  respiratory variability, suggesting  right atrial pressure of 3 mmHg.    Patient Profile     53 y.o. female with PMH of chronic HFrEF (EF 20-25%), COPD, tobacco use, DM II, anxiety, and bipolar disorder who presented with worsening shortness of breath.   Assessment & Plan    Acute on chronic systolic heart failure  - BNP 4540 and elevated liver enzyme  - Co-ox suggest euvolemic level. VQ scan normal  - Echo 07/21/2023 showed EF 20-25%, global hypokinesis, mild LVH, severe MR.   - continue home midodrine and bumex.   - euvolemic on exam today.   Severe MR: felt not to be good surgical candidate  CKD stage III DM II COPD: wheezing on exam, consider breathing treatment    For questions or updates, please contact [facility] Please consult [email or web address] for contact info under        Signed, Azalee Course, PA  07/23/2023, 8:05 AM

## 2023-07-23 NOTE — Progress Notes (Signed)
CVAD removed per protocol per MD order. Manual pressure applied for 3 mins. Vaseline gauze, gauze, and Tegaderm applied over insertion site. No bleeding or swelling noted. Instructed patient to remain in bed for thirty mins. Educated patient about S/S of infection and when to call MD; no heavy lifting or pressure on R side for 24 hours; keep dressing dry and intact for 24 hours. Pt verbalized comprehension.

## 2023-07-23 NOTE — Discharge Summary (Signed)
Physician Discharge Summary  [name] [name]:[id number] DOB: [birth year] DOA: 07/18/2023  PCP: [facility]  Admit date: 07/18/2023 Discharge date: 07/23/2023 30 Day Unplanned Readmission Risk Score    Flowsheet Row ED to Hosp-Admission (Current) from 07/18/2023 in [facility]  30 Day Unplanned Readmission Risk Score (%) 47.84 Filed at 07/23/2023 0801       This score is the patient's risk of an unplanned readmission within 30 days of being discharged (0 -100%). The score is based on dignosis, age, lab data, medications, orders, and past utilization.   Low:  0-14.9   Medium: 15-21.9   High: 22-29.9   Extreme: 30 and above          Admitted From: Home Disposition: Home  Recommendations for Outpatient Follow-up:  Follow up with PCP in 1-2 weeks Please obtain BMP/CBC in one week Follow-up with cardiology per their scheduled time and date Please follow up with your PCP on the following pending results: Unresulted Labs (From admission, onward)    None         Home Health: Yes Equipment/Devices: None  Discharge Condition: Stable CODE STATUS: Full code Diet recommendation: Low-salt and diabetic  Subjective: Seen and examined.  She still complains of shortness of breath with walking however lungs are clear to auscultation.  She appears extremely anxious and her shortness of breath is likely subjective due to her anxiety.  Patient appears stable otherwise for discharge and has been cleared by cardiology as well.  Brief/Interim Summary: 53 yo female with the past medical history of heart failure, CKD, T2DM, COPD, bipolar and chronic pain syndrome who presented with dyspnea for the last 5 to 6 days prior to admission. Positive dyspnea on exertion, PND and orthopnea.  On her initial physical examination her blood pressure was 116/40, HR 107, RR 29 and 02 saturation 100% on 2 L/min per [place].  Lungs with no wheezing or rhonchi, heart with S1 and S2 present and  regular with no gallops, rubs or murmurs, abdomen with no distention and positive lower extremity edema.  She was admitted to the hospital with the working diagnosis of acute on chronic systolic heart failure.  She was tested negative for COVID and influenza.  Troponins were 53-54.  BNP was 4443.Chest radiograph with cardiomegaly with bilateral hilar vascular congestion, cephalization of the vasculature, no effusions or infiltrates.  Further details below.   Acute on chronic systolic CHF (congestive heart failure) (HCC)/acute on chronic cor pulmonale: Pulmonary hypertension. Echocardiogram with reduced LV systolic function EF 20 to 25%, global hypokinesis, LV cavity dilatated, RV systolic function preserved, severe mitral valve regurgitation. RVSP 37,4 mmHg, total output 3341 since admission.   Blood pressure support with midodrine.  Continue with SGLT 2 inh and resume bumetanide 1 mg daily.  Limited pharmacologic therapy due to risk of hypotension.  Cardiology recommended discontinuing Aldactone.   Non sustained VT, remained stable.   Patient had CT chest angiography in 05/2023 negative for pulmonary embolism. V/Q scan negative for chronic thromboembolic disease.    Acute hypoxemic respiratory failure due to cardiogenic pulmonary edema, has resolved. 02 saturation today is 95% on room air.   Dyspnea has been persistent possibly not cardiac or pulmonary in origin.  It is likely stemming from her anxiety.  She has no wheezes today (per reports, she has chronic wheezing) that means she is much better than her baseline.   COPD (chronic obstructive pulmonary disease) (HCC) No signs of acute exacerbation.  Continue with bronchodilator  therapy.  Inhaled corticosteroid.  She has no wheezes today.   CKD stage 3a, GFR 45-59 ml/min (HCC)    DM2 (diabetes mellitus, type 2) (HCC) Resume PTA medications   Dyslipidemia, continue with statin therapy.    HTN (hypertension) Continue blood pressure  support with midodrine    Chronic pain syndrome Continue with oxycodone.    Bipolar 1 disorder (HCC) Continue with sertraline.   GOC: Cardiology had frank conversation with her about goal of care and poor prognosis.  Patient elected to be DNR.  She was transition to DNR.  She also expressed his desire to go home with hospice.  Hospice has been arranged for her and they will see her tomorrow morning.  Patient is stable and will discharge today.  Discharge plan was discussed with patient and/or family member and they verbalized understanding and agreed with it.  Discharge Diagnoses:  Principal Problem:   Acute on chronic systolic CHF (congestive heart failure) (HCC) Active Problems:   COPD (chronic obstructive pulmonary disease) (HCC)   CKD stage 3a, GFR 45-59 ml/min (HCC)   DM2 (diabetes mellitus, type 2) (HCC)   HTN (hypertension)   Chronic pain syndrome   Bipolar 1 disorder (HCC)    Discharge Instructions   Allergies as of 07/23/2023       Reactions   Tramadol Nausea Only, Rash   Wellbutrin [bupropion] Other (See Comments)   Behavioral changes (aggression, irritability)   Benadryl [diphenhydramine] Other (See Comments)   Decreases motor skills and increases anxiety   Lactose Intolerance (gi) Diarrhea   Nicotine Rash   Patient states she is allergic to the Nicotine patch and they cause her to break out in a rash.  She states she can smoke cigarettes.        Medication List     STOP taking these medications    spironolactone 25 MG tablet Commonly known as: ALDACTONE       TAKE these medications    bumetanide 1 MG tablet Commonly known as: BUMEX Take 1 tablet (1 mg total) by mouth daily.   Dulera 200-5 MCG/ACT Aero Generic drug: mometasone-formoterol Inhale 2 puffs into the lungs 2 (two) times daily.   empagliflozin 10 MG Tabs tablet Commonly known as: JARDIANCE Take 1 tablet (10 mg total) by mouth daily.   Insupen Pen Needles 32G X 4 MM Misc Generic  drug: Insulin Pen Needle Use As Directed   Insulin Pen Needle 32G X 4 MM Misc Use 3 (three) times daily.   ipratropium-albuterol 0.5-2.5 (3) MG/3ML Soln Commonly known as: DUONEB inhale 3 mLs by nebulization every 6 (six) hours as needed. What changed: reasons to take this   Lurasidone HCl 60 MG Tabs Take 1 tablet (60 mg total) by mouth daily with supper.   midodrine 10 MG tablet Commonly known as: PROAMATINE Take 1 tablet (10 mg total) by mouth 3 (three) times daily with meals.   OneTouch Delica Lancets 33G Misc Use 3 (three) times daily as directed to check blood sugar   OneTouch Delica Plus Lancing Misc Use 3 (three) times daily.   OneTouch Verio test strip Generic drug: glucose blood Use three times daily as directed to check blood sugar   oxyCODONE-acetaminophen 10-325 MG tablet Commonly known as: PERCOCET Take 1 tablet by mouth every 6 (six) hours as needed for pain.   promethazine 25 MG tablet Commonly known as: PHENERGAN Take 25 mg by mouth every 6 (six) hours as needed for nausea or vomiting.   rosuvastatin 20 MG  tablet Commonly known as: CRESTOR Take 1 tablet (20 mg total) by mouth daily.   sertraline 100 MG tablet Commonly known as: ZOLOFT Take 1 tablet (100 mg total) by mouth daily.   Toujeo SoloStar 300 UNIT/ML Solostar Pen Generic drug: insulin glargine (1 Unit Dial) Inject 10 Units into the skin at bedtime. Take 15 units daily for 2 days while on prednisone and then go back to your home dose of 10 units daily at bedtime   Ventolin HFA 108 (90 Base) MCG/ACT inhaler Generic drug: albuterol Inhale 2 puffs into the lungs every 6 (six) hours as needed for wheezing or shortness of breath.        Follow-up Information     [facility] Follow up in 1 week(s).   Contact information: [address] [place] [address] [phone]                Allergies  Allergen Reactions   Tramadol Nausea Only and Rash   Wellbutrin  [Bupropion] Other (See Comments)    Behavioral changes (aggression, irritability)   Benadryl [Diphenhydramine] Other (See Comments)    Decreases motor skills and increases anxiety   Lactose Intolerance (Gi) Diarrhea   Nicotine Rash    Patient states she is allergic to the Nicotine patch and they cause her to break out in a rash.  She states she can smoke cigarettes.    Consultations: Cardiology   Procedures/Studies: DG CHEST PORT 1 VIEW Result Date: 07/22/2023 CLINICAL DATA:  Dyspnea. EXAM: PORTABLE CHEST 1 VIEW COMPARISON:  07/21/2023 FINDINGS: Tip of the right upper extremity PICC in the SVC. Cardiomegaly is stable. No pulmonary edema. No large pleural effusion. No evidence of focal airspace disease. No pneumothorax. IMPRESSION: Stable cardiomegaly.  No pulmonary edema or focal pulmonary process. Electronically Signed   By: Narda Rutherford M.D.   On: 07/22/2023 11:51   NM Pulmonary Perfusion Result Date: 07/21/2023 CLINICAL DATA:  Pulmonary hypertension. Congestive heart failure. Shortness of breath. EXAM: NUCLEAR MEDICINE PERFUSION LUNG SCAN TECHNIQUE: Perfusion images were obtained in multiple projections after intravenous injection of radiopharmaceutical. Ventilation scans intentionally deferred if perfusion scan and chest x-ray adequate for interpretation during COVID 19 epidemic. RADIOPHARMACEUTICALS:  4.3 mCi Tc-43m MAA IV COMPARISON:  Chest radiography same day FINDINGS: No perfusion defect to suggest embolic disease. Enlarged cardiac silhouette. No perfusion abnormality not attributable to the cardiomegaly. IMPRESSION: Enlarged cardiac silhouette. Otherwise negative pulmonary perfusion examination. Electronically Signed   By: Paulina Fusi M.D.   On: 07/21/2023 17:08   ECHOCARDIOGRAM LIMITED Result Date: 07/21/2023    ECHOCARDIOGRAM LIMITED REPORT   Patient Name:   AMELIE HOLLARS Date of Exam: 07/21/2023 Medical Rec #:  [id number]       Height:       65.0 in Accession #:    [id number]       Weight:       158.0 lb Date of Birth:  January 10, 1971       BSA:          1.790 m Patient Age:    53 years        BP:           112/73 mmHg Patient Gender: F               HR:           88 bpm. Exam Location:  Inpatient Procedure: Limited Echo, Color Doppler, Cardiac Doppler and Intracardiac  Opacification Agent Indications:    CHF  History:        Patient has prior history of Echocardiogram examinations, most                 recent 05/25/2023. CHF, COPD; Risk Factors:Diabetes and                 Hypertension.  Sonographer:    Melton Krebs RDCS, FE, PE Referring Phys: [id number] Perlie Gold IMPRESSIONS  1. Left ventricular ejection fraction, by estimation, is 20 to 25%. The left ventricle has severely decreased function. The left ventricle demonstrates global hypokinesis. The left ventricular internal cavity size was severely dilated. There is mild left ventricular hypertrophy.  2. Right ventricular systolic function is moderately reduced. The right ventricular size is normal.  3. The mitral valve is normal in structure. Severe mitral valve regurgitation. No evidence of mitral stenosis.  4. The aortic valve is tricuspid. Aortic valve regurgitation is trivial. No aortic stenosis is present.  5. The inferior vena cava is normal in size with greater than 50% respiratory variability, suggesting right atrial pressure of 3 mmHg. FINDINGS  Left Ventricle: Left ventricular ejection fraction, by estimation, is 20 to 25%. The left ventricle has severely decreased function. The left ventricle demonstrates global hypokinesis. Definity contrast agent was given IV to delineate the left ventricular endocardial borders. The left ventricular internal cavity size was severely dilated. There is mild left ventricular hypertrophy. Right Ventricle: The right ventricular size is normal. Right ventricular systolic function is moderately reduced. Left Atrium: Left atrial size was normal in size. Right Atrium: Right atrial size was  normal in size. Pericardium: There is no evidence of pericardial effusion. Mitral Valve: The mitral valve is normal in structure. Severe mitral valve regurgitation. No evidence of mitral valve stenosis. Tricuspid Valve: The tricuspid valve is normal in structure. Tricuspid valve regurgitation is mild . No evidence of tricuspid stenosis. Aortic Valve: The aortic valve is tricuspid. Aortic valve regurgitation is trivial. No aortic stenosis is present. Pulmonic Valve: The pulmonic valve was normal in structure. Pulmonic valve regurgitation is mild. No evidence of pulmonic stenosis. Aorta: The aortic root is normal in size and structure. Venous: The inferior vena cava is normal in size with greater than 50% respiratory variability, suggesting right atrial pressure of 3 mmHg. IAS/Shunts: There is right bowing of the interatrial septum, suggestive of elevated left atrial pressure. LEFT VENTRICLE PLAX 2D LVIDd:         7.20 cm LVIDs:         6.40 cm LV PW:         1.20 cm LV IVS:        1.20 cm  LV Volumes (MOD) LV vol d, MOD A2C: 248.0 ml LV vol d, MOD A4C: 246.0 ml LV vol s, MOD A2C: 197.0 ml LV vol s, MOD A4C: 186.0 ml LV SV MOD A2C:     51.0 ml LV SV MOD A4C:     246.0 ml LV SV MOD BP:      53.5 ml LEFT ATRIUM         Index LA diam:    5.40 cm 3.02 cm/m  TRICUSPID VALVE TR Peak grad:   42.0 mmHg TR Vmax:        324.00 cm/s Olga Millers MD Electronically signed by Olga Millers MD Signature Date/Time: 07/21/2023/3:17:20 PM    Final    DG CHEST PORT 1 VIEW Result Date: 07/21/2023 CLINICAL DATA:  Shortness of breath EXAM:  PORTABLE CHEST 1 VIEW COMPARISON:  07/18/2023 FINDINGS: Cardiac shadow is enlarged but stable. Right-sided PICC is noted in satisfactory position. The lungs are clear bilaterally. No focal infiltrate or effusion is noted. No bony abnormality is seen. IMPRESSION: Stable cardiomegaly.  No acute abnormality noted. Electronically Signed   By: Alcide Clever M.D.   On: 07/21/2023 11:11   [place] EKG SITE  RITE Result Date: 07/20/2023 If Site Rite image not attached, placement could not be confirmed due to current cardiac rhythm.  DG Chest Portable 1 View Result Date: 07/18/2023 CLINICAL DATA:  Shortness of breath EXAM: PORTABLE CHEST 1 VIEW COMPARISON:  Chest x-ray 07/05/2023 FINDINGS: Heart is moderately enlarged, unchanged. The lungs are clear. There is no pleural effusion or pneumothorax. No acute fractures are seen. IMPRESSION: Moderate cardiomegaly. No acute cardiopulmonary process. Electronically Signed   By: Darliss Cheney M.D.   On: 07/18/2023 18:39   DG Chest Port 1 View Result Date: 07/05/2023 CLINICAL DATA:  Shortness of breath with wheezing EXAM: PORTABLE CHEST 1 VIEW COMPARISON:  Chest x-ray 05/22/2023 FINDINGS: Heart is moderately enlarged, unchanged. Lungs are clear. There is no pleural effusion or pneumothorax. No acute fractures are seen. IMPRESSION: Moderate cardiomegaly. No acute cardiopulmonary process. Electronically Signed   By: Darliss Cheney M.D.   On: 07/05/2023 19:15     Discharge Exam: Vitals:   07/23/23 0311 07/23/23 0738  BP: 124/87 123/80  Pulse:    Resp: 18 17  Temp: 98.1 F (36.7 C) 98.9 F (37.2 C)  SpO2: 99% 98%   Vitals:   07/22/23 1941 07/23/23 0309 07/23/23 0311 07/23/23 0738  BP: 132/84  124/87 123/80  Pulse: 97     Resp: 16  18 17   Temp: 98 F (36.7 C)  98.1 F (36.7 C) 98.9 F (37.2 C)  TempSrc: Oral  Oral Oral  SpO2:   99% 98%  Weight:  70.6 kg    Height:        General: Pt is alert, awake, not in acute distress, appears extremely anxious Cardiovascular: RRR, S1/S2 +, no rubs, no gallops Respiratory: CTA bilaterally, no wheezing, no rhonchi Abdominal: Soft, NT, ND, bowel sounds + Extremities: no edema, no cyanosis    The results of significant diagnostics from this hospitalization (including imaging, microbiology, ancillary and laboratory) are listed below for reference.     Microbiology: Recent Results (from the past 240 hours)   Resp panel by RT-PCR (RSV, Flu A&B, Covid) Anterior Nasal Swab     Status: None   Collection Time: 07/18/23  5:45 PM   Specimen: Anterior Nasal Swab  Result Value Ref Range Status   SARS Coronavirus 2 by RT PCR NEGATIVE NEGATIVE Final   Influenza A by PCR NEGATIVE NEGATIVE Final   Influenza B by PCR NEGATIVE NEGATIVE Final    Comment: (NOTE) The Xpert Xpress SARS-CoV-2/FLU/RSV plus assay is intended as an aid in the diagnosis of influenza from Nasopharyngeal swab specimens and should not be used as a sole basis for treatment. Nasal washings and aspirates are unacceptable for Xpert Xpress SARS-CoV-2/FLU/RSV testing.  Fact Sheet for Patients: BloggerCourse.com  Fact Sheet for Healthcare Providers: SeriousBroker.it  This test is not yet approved or cleared by the [place] FDA and has been authorized for detection and/or diagnosis of SARS-CoV-2 by FDA under an Emergency Use Authorization (EUA). This EUA will remain in effect (meaning this test can be used) for the duration of the COVID-19 declaration under Section 564(b)(1) of the Act, 21 U.S.C. section 360bbb-3(b)(1), unless  the authorization is terminated or revoked.     Resp Syncytial Virus by PCR NEGATIVE NEGATIVE Final    Comment: (NOTE) Fact Sheet for Patients: BloggerCourse.com  Fact Sheet for Healthcare Providers: SeriousBroker.it  This test is not yet approved or cleared by the [place] FDA and has been authorized for detection and/or diagnosis of SARS-CoV-2 by FDA under an Emergency Use Authorization (EUA). This EUA will remain in effect (meaning this test can be used) for the duration of the COVID-19 declaration under Section 564(b)(1) of the Act, 21 U.S.C. section 360bbb-3(b)(1), unless the authorization is terminated or revoked.  Performed at [facility], [address], [place],  [place] [address]      Labs: BNP (last 3 results) Recent Labs    05/22/23 1458 07/05/23 1625 07/18/23 1740  BNP 2,978.4* 3,720.3* 4,443.3*   Basic Metabolic Panel: Recent Labs  Lab 07/18/23 1740 07/18/23 2051 07/19/23 0109 07/20/23 0353 07/21/23 0621 07/22/23 0900  NA 133* 134* 136 136 136 142  K 4.4 4.4 3.8 3.3* 3.7 3.8  CL 101  --  104 94* 97* 105  CO2 19*  --  23 27 33* 28  GLUCOSE 648*  --  319* 120* 298* 317*  BUN 20  --  22* 29* 25* 25*  CREATININE 1.00  --  1.20* 1.75* 0.96 1.04*  CALCIUM 8.5*  --  8.1* 7.4* 7.5* 7.9*  MG  --   --   --  1.7 2.0  --    Liver Function Tests: Recent Labs  Lab 07/19/23 0109  AST 50*  ALT 97*  ALKPHOS 94  BILITOT 1.3*  PROT 5.2*  ALBUMIN 2.9*   No results for input(s): "LIPASE", "AMYLASE" in the last 168 hours. No results for input(s): "AMMONIA" in the last 168 hours. CBC: Recent Labs  Lab 07/18/23 1740 07/18/23 2051 07/19/23 0109  WBC 7.3  --  8.4  NEUTROABS 6.5  --   --   HGB 12.1 13.6 11.1*  HCT 38.3 40.0 35.9*  MCV 88.2  --  90.7  PLT 211  --  202   Cardiac Enzymes: No results for input(s): "CKTOTAL", "CKMB", "CKMBINDEX", "TROPONINI" in the last 168 hours. BNP: Invalid input(s): "POCBNP" CBG: Recent Labs  Lab 07/22/23 0720 07/22/23 1210 07/22/23 1626 07/22/23 2137 07/23/23 0743  GLUCAP 154* 346* 173* 222* 203*   D-Dimer No results for input(s): "DDIMER" in the last 72 hours. Hgb A1c No results for input(s): "HGBA1C" in the last 72 hours. Lipid Profile No results for input(s): "CHOL", "HDL", "LDLCALC", "TRIG", "CHOLHDL", "LDLDIRECT" in the last 72 hours. Thyroid function studies No results for input(s): "TSH", "T4TOTAL", "T3FREE", "THYROIDAB" in the last 72 hours.  Invalid input(s): "FREET3" Anemia work up No results for input(s): "VITAMINB12", "FOLATE", "FERRITIN", "TIBC", "IRON", "RETICCTPCT" in the last 72 hours. Urinalysis    Component Value Date/Time   COLORURINE YELLOW 09/06/2022 0108    APPEARANCEUR HAZY (A) 09/06/2022 0108   LABSPEC 1.026 09/06/2022 0108   PHURINE 5.0 09/06/2022 0108   GLUCOSEU >=500 (A) 09/06/2022 0108   HGBUR NEGATIVE 09/06/2022 0108   HGBUR trace-intact 11/25/2008 1530   BILIRUBINUR NEGATIVE 09/06/2022 0108   KETONESUR NEGATIVE 09/06/2022 0108   PROTEINUR NEGATIVE 09/06/2022 0108   UROBILINOGEN 0.2 05/12/2014 2315   NITRITE NEGATIVE 09/06/2022 0108   LEUKOCYTESUR NEGATIVE 09/06/2022 0108   Sepsis Labs Recent Labs  Lab 07/18/23 1740 07/19/23 0109  WBC 7.3 8.4   Microbiology Recent Results (from the past 240 hours)  Resp panel  by RT-PCR (RSV, Flu A&B, Covid) Anterior Nasal Swab     Status: None   Collection Time: 07/18/23  5:45 PM   Specimen: Anterior Nasal Swab  Result Value Ref Range Status   SARS Coronavirus 2 by RT PCR NEGATIVE NEGATIVE Final   Influenza A by PCR NEGATIVE NEGATIVE Final   Influenza B by PCR NEGATIVE NEGATIVE Final    Comment: (NOTE) The Xpert Xpress SARS-CoV-2/FLU/RSV plus assay is intended as an aid in the diagnosis of influenza from Nasopharyngeal swab specimens and should not be used as a sole basis for treatment. Nasal washings and aspirates are unacceptable for Xpert Xpress SARS-CoV-2/FLU/RSV testing.  Fact Sheet for Patients: BloggerCourse.com  Fact Sheet for Healthcare Providers: SeriousBroker.it  This test is not yet approved or cleared by the [place] FDA and has been authorized for detection and/or diagnosis of SARS-CoV-2 by FDA under an Emergency Use Authorization (EUA). This EUA will remain in effect (meaning this test can be used) for the duration of the COVID-19 declaration under Section 564(b)(1) of the Act, 21 U.S.C. section 360bbb-3(b)(1), unless the authorization is terminated or revoked.     Resp Syncytial Virus by PCR NEGATIVE NEGATIVE Final    Comment: (NOTE) Fact Sheet for Patients: BloggerCourse.com  Fact  Sheet for Healthcare Providers: SeriousBroker.it  This test is not yet approved or cleared by the [place] FDA and has been authorized for detection and/or diagnosis of SARS-CoV-2 by FDA under an Emergency Use Authorization (EUA). This EUA will remain in effect (meaning this test can be used) for the duration of the COVID-19 declaration under Section 564(b)(1) of the Act, 21 U.S.C. section 360bbb-3(b)(1), unless the authorization is terminated or revoked.  Performed at [facility], [address], [place], [place] [address]     FURTHER DISCHARGE INSTRUCTIONS:   Get Medicines reviewed and adjusted: Please take all your medications with you for your next visit with your Primary MD   Laboratory/radiological data: Please request your Primary MD to go over all hospital tests and procedure/radiological results at the follow up, please ask your Primary MD to get all Hospital records sent to his/her office.   In some cases, they will be blood work, cultures and biopsy results pending at the time of your discharge. Please request that your primary care M.D. goes through all the records of your hospital data and follows up on these results.   Also Note the following: If you experience worsening of your admission symptoms, develop shortness of breath, life threatening emergency, suicidal or homicidal thoughts you must seek medical attention immediately by calling 911 or calling your MD immediately  if symptoms less severe.   You must read complete instructions/literature along with all the possible adverse reactions/side effects for all the Medicines you take and that have been prescribed to you. Take any new Medicines after you have completely understood and accpet all the possible adverse reactions/side effects.    Do not drive when taking Pain medications or sleeping medications (Benzodaizepines)   Do not take more than prescribed Pain, Sleep and Anxiety  Medications. It is not advisable to combine anxiety,sleep and pain medications without talking with your primary care practitioner   Special Instructions: If you have smoked or chewed Tobacco  in the last 2 yrs please stop smoking, stop any regular Alcohol  and or any Recreational drug use.   Wear Seat belts while driving.   Please note: You were cared for by a hospitalist during your hospital stay. Once  you are discharged, your primary care physician will handle any further medical issues. Please note that NO REFILLS for any discharge medications will be authorized once you are discharged, as it is imperative that you return to your primary care physician (or establish a relationship with a primary care physician if you do not have one) for your post hospital discharge needs so that they can reassess your need for medications and monitor your lab values  Time coordinating discharge: Over 30 minutes  SIGNED:   Hughie Closs, MD  [facility] 07/23/2023, 10:15 AM *Please note that this is a verbal dictation therefore any spelling or grammatical errors are due to the "Dragon [employer] One" system interpretation. If 7PM-7AM, please contact night-coverage [email or web address]

## 2023-07-23 NOTE — Progress Notes (Signed)
Physical Therapy Treatment Patient Details Name: [name] [name] MRN: [id number] DOB: 12-07-1970 Today's Date: 07/23/2023   History of Present Illness 53 yo female who presents to [facility] on 07/18/23 with working dx of acute CHF. Recently admitted with chest pain, cough, and wheezing on 07/05/23. PMH: COPD, Asthma, T2DM, depression, bipolar disorder, heart failure and chronic pain syndrome, CKD III    PT Comments  The pt was able to progress to ambulating in the halls today, but remains limited by endurance deficits. She is currently benefiting from utilizing a rollator for energy conservation with longer distance gait bouts, but is capable of ambulating without UE support without LOB. Pt primarily complaining of difficulty breathing, but her SpO2 remained in the 90s% while on RA throughout. Pt making comments like "I don't want to live like this" and "This is no way to live" but denied any desire to harm herself. RN notified. Of note,HR spontaneously jumped up to 180 bpm sitting EOB but lead was off, once back on it read low 100s. Also, her SBP was 100s-120s with mild lightheadedness reported. Educated pt to hydrate as able. Educated pt on weighing self daily, increased mobility frequency, and reducing sodium/processed foods intake to manage her CHF. Encouraged pt to mobilize at least 3x daily in halls with staff. She verbalized understanding. Will continue to follow acutely.   If plan is discharge home, recommend the following: A little help with walking and/or transfers;A little help with bathing/dressing/bathroom;Help with stairs or ramp for entrance;Assist for transportation   Can travel by private vehicle        Equipment Recommendations  Rollator (4 wheels)    Recommendations for Other Services       Precautions / Restrictions Precautions Precautions: Fall Precaution/Restrictions Comments: soft BP, reports vision impairments from progressive diabetes Restrictions Weight Bearing Restrictions  Per Provider Order: No     Mobility  Bed Mobility Overal bed mobility: Modified Independent Bed Mobility: Supine to Sit, Sit to Supine     Supine to sit: Modified independent (Device/Increase time), HOB elevated Sit to supine: Modified independent (Device/Increase time), HOB elevated   General bed mobility comments: HOB elevated, no assistance needed    Transfers Overall transfer level: Needs assistance Equipment used: None, Rollator (4 wheels) Transfers: Sit to/from Stand, Bed to chair/wheelchair/BSC Sit to Stand: Supervision   Step pivot transfers: Supervision       General transfer comment: supervision for safety standing from EOB 1x to rollator then standing and pivoting EOB <> commode 1x each without UE support, no LOB    Ambulation/Gait Ambulation/Gait assistance: Supervision Gait Distance (Feet): 110 Feet (x3 bouts of ~110 ft > ~2 ft > ~2 ft) Assistive device: Rollator (4 wheels), None Gait Pattern/deviations: Step-through pattern, Decreased stride length, Trunk flexed Gait velocity: reduced Gait velocity interpretation: <1.31 ft/sec, indicative of household ambulator   General Gait Details: Pt ambulated the majority of the first bout with rollator support. Cues provided for activity pacing. Cues also provided for hand placement on rollator and proximity to rollator, good carryover noted.  Pt ambulated final ~15 ft in the room and the x2 short gait bouts without UE support, no overt LOB. Supervision for safety   Stairs             Wheelchair Mobility     Tilt Bed    Modified Rankin (Stroke Patients Only)       Balance Overall balance assessment: Needs assistance Sitting-balance support: No upper extremity supported, Feet supported Sitting balance-Leahy Scale: Good  Sitting balance - Comments: able to reach off COG to donn socks, no LOB   Standing balance support: No upper extremity supported, During functional activity, Bilateral upper extremity  supported Standing balance-Leahy Scale: Fair Standing balance comment: ABle to ambulate at least short distances without UE support, utilized rollator for longer distances today                            Communication Communication Communication: No apparent difficulties  Cognition Arousal: Alert Behavior During Therapy: WFL for tasks assessed/performed   PT - Cognitive impairments: No apparent impairments                       PT - Cognition Comments: Pt squinting eyes often, but reports vision deficits at baseline from diabetes. Seemed a little slow to process info, but likely close to baseline. Following commands: Intact      Cueing Cueing Techniques: Verbal cues  Exercises      General Comments General comments (skin integrity, edema, etc.): SpO2 stable in 90s% on RA throughout; HR spontaneously jumped up to 180 bpm sitting EOB but lead was off, once back on it read low 100s; SBP 100s-120s, mild lightheadedness reported, educated pt to hydrate; educated pt on weighing self daily, increased mobility frequency, and reducing sodium/processed foods intake      Pertinent Vitals/Pain Pain Assessment Pain Assessment: 0-10 Pain Score: 10-Worst pain ever (started at 8, progressed to 10) Pain Location: chest (central and inferior down to abdomen), low back Pain Descriptors / Indicators: Grimacing, Guarding, Discomfort Pain Intervention(s): Limited activity within patient's tolerance, Monitored during session, Repositioned, Patient requesting pain meds-RN notified    Home Living                          Prior Function            PT Goals (current goals can now be found in the care plan section) Acute Rehab PT Goals Patient Stated Goal: home PT Goal Formulation: With patient Time For Goal Achievement: 08/04/23 Potential to Achieve Goals: Good Progress towards PT goals: Progressing toward goals    Frequency    Min 1X/week      PT Plan       Co-evaluation              AM-PAC PT "6 Clicks" Mobility   Outcome Measure  Help needed turning from your back to your side while in a flat bed without using bedrails?: None Help needed moving from lying on your back to sitting on the side of a flat bed without using bedrails?: None Help needed moving to and from a bed to a chair (including a wheelchair)?: A Little Help needed standing up from a chair using your arms (e.g., wheelchair or bedside chair)?: A Little Help needed to walk in hospital room?: A Little Help needed climbing 3-5 steps with a railing? : A Little 6 Click Score: 20    End of Session Equipment Utilized During Treatment: Gait belt Activity Tolerance: Patient limited by fatigue;Patient tolerated treatment well Patient left: with call bell/phone within reach;in bed;with bed alarm set (pt declined sitting in recliner) Nurse Communication: Mobility status;Patient requests pain meds;Other (comment) (pt making comments like "I don't want to live like this" and "This is no way to live" but denied any desire to harm herself) PT Visit Diagnosis: Unsteadiness on feet (R26.81);Muscle weakness (generalized) (M62.81);Other  abnormalities of gait and mobility (R26.89)     Time: 1610-9604 PT Time Calculation (min) (ACUTE ONLY): 31 min  Charges:    $Gait Training: 8-22 mins $Therapeutic Activity: 8-22 mins PT General Charges $$ ACUTE PT VISIT: 1 Visit                     Virgil Benedict, PT, DPT Acute Rehabilitation Services  Office: [phone]    Bettina Gavia 07/23/2023, 9:56 AM

## 2023-07-23 NOTE — TOC Initial Note (Addendum)
Transition of Care [facility]) - Initial/Assessment Note    Patient Details  Name: [name] [name] MRN: [id number] Date of Birth: 02/12/1971  [facility]) CM/SW Contact:    Gala Lewandowsky, RN Phone Number: 07/23/2023, 12:05 PM  Clinical Narrative:  Risk for readmission assessment completed. PTA patient was from home with friend Lawson Fiscal. Patient has decided to transition home with Hospice Services. Case Manager offered choice and the patient chose [facility]. Referral submitted to [facility] and [employer] is working with medical advisor to see if the patient is Hospice eligible. Awaiting to hear back from Hospice. Case Manager spoke with Lawson Fiscal, friend that the patient lives with and she is open to the patient returning home if she does not need oxygen. Ambulatory sat completed and the patient will not need oxygen at this time.  Patient will be able to travel home via Cab. No further needs identified at this time.      07-23-23 1512 [facility] will not be able to service the patient at this time due to potential barriers to [facility]'s ability to care for this patient who would likely need multiple visits a week in the home setting we are unable to accept patient at home at this time. [facility] recommended consulting another hospice agency for potential to serve at home. Case Manager has contacted [facility] to see if their Physician can weigh in on the situation.    07-23-23 1558 [facility] can accept the patient and patient will be seen on 07-24-23. Patient will transition home via cab.  1632 07-23-23 [facility] is there to accept the patient. Hospice will admit the patient this evening between 7:30 pm-8:00 pm. No further needs identified.  Expected Discharge Plan: Home w Hospice Care Barriers to Discharge: No Barriers Identified  Patient Goals and CMS Choice Patient states their goals for this hospitalization and  ongoing recovery are:: to return home with hospice   Choice offered to / list presented to : Patient    Expected Discharge Plan and Services In-house Referral: NA Discharge Planning Services: CM Consult Post Acute Care Choice: Hospice Living arrangements for the past 2 months: Apartment Expected Discharge Date: 07/23/23                 DME Agency: NA     HH Arranged: RN HH Agency: [facility] Date HH Agency Contacted: 07/23/23 Time HH Agency Contacted: 1015 Representative spoke with at [facility] Agency: Hillary  Prior Living Arrangements/Services Living arrangements for the past 2 months: Apartment Lives with:: Friends Patient language and need for interpreter reviewed:: Yes Do you feel safe going back to the place where you live?: Yes      Need for Family Participation in Patient Care: Yes (Comment) Care giver support system in place?: Yes (comment)   Criminal Activity/Legal Involvement Pertinent to Current Situation/Hospitalization: No - Comment as needed  Activities of Daily Living   ADL Screening (condition at time of admission) Independently performs ADLs?: Yes (appropriate for developmental age) Is the patient deaf or have difficulty hearing?: No Does the patient have difficulty seeing, even when wearing glasses/contacts?: No Does the patient have difficulty concentrating, remembering, or making decisions?: No  Permission Sought/Granted Permission sought to share information with : Case Manager, Magazine features editor Permission granted to share information with : Yes, Verbal Permission Granted     Permission granted to share info w AGENCY: [facility]     Emotional Assessment Appearance:: Appears stated age Attitude/Demeanor/Rapport: Engaged Affect (typically observed): Appropriate Orientation: : Oriented to Self, Oriented to Place, Oriented to  Time, Oriented to Situation Alcohol / Substance Use: Not Applicable Psych Involvement: No  (comment)  Admission diagnosis:  Orthopnea [R06.01] Hyperglycemia [R73.9] Acute on chronic combined systolic (congestive) and diastolic (congestive) heart failure (HCC) [I50.43] Acute on chronic systolic CHF (congestive heart failure) (HCC) [I50.23] Dyspnea, unspecified type [R06.00] Patient Active Problem List   Diagnosis Date Noted   Dyspnea 07/21/2023   GAD (generalized anxiety disorder) 07/18/2023   Acute on chronic combined systolic (congestive) and diastolic (congestive) heart failure (HCC) 07/18/2023   Bipolar 1 disorder (HCC) 07/06/2023   Acute exacerbation of CHF (congestive heart failure) (HCC) 07/05/2023   Chronic pain syndrome 05/25/2023   Heart failure (HCC) 05/23/2023   CHF (congestive heart failure) (HCC) 05/22/2023   Hypomagnesemia 05/22/2023   Hypotension 02/14/2023   Prolonged QT interval 09/17/2022   Severe mitral regurgitation 09/17/2022   CKD stage 3a, GFR 45-59 ml/min (HCC) 09/17/2022   Hyperglycemic crisis due to diabetes mellitus (HCC) 08/30/2022   Hyperthyroidism 08/29/2022   COPD (chronic obstructive pulmonary disease) (HCC) 08/04/2022   Chronic systolic CHF (congestive heart failure) (HCC) 08/03/2022   Acute on chronic systolic CHF (congestive heart failure) (HCC) 05/15/2022   Overdose, intentional self-harm, initial encounter (HCC) 12/13/2018   DM2 (diabetes mellitus, type 2) (HCC) 01/03/2018   HTN (hypertension) 08/28/2015   PCP:  [facility]:   [id number] [place], [place] - [address] AT [facility] OF [facility] & [address] [place] [address] Phone: [phone] Fax: [phone]  [facility] [address] [place] [address] Phone: [phone] Fax: [phone]  Social Drivers of Health (SDOH) Social History: SDOH Screenings   Food Insecurity: No Food Insecurity (07/19/2023)  Recent Concern: Food Insecurity - Food Insecurity Present  (07/06/2023)  Housing: Low Risk  (07/19/2023)  Transportation Needs: No Transportation Needs (07/19/2023)  Utilities: Not At Risk (07/19/2023)  Alcohol Screen: Low Risk  (02/12/2023)  Depression (PHQ2-9): High Risk (09/30/2022)  Financial Resource Strain: High Risk (09/30/2022)  Social Connections: Unknown (07/19/2023)  Stress: No Stress Concern Present (01/07/2022)   Received from [facility]  Tobacco Use: High Risk (07/18/2023)   Readmission Risk Interventions    07/23/2023   12:02 PM 07/10/2023    1:21 PM 02/12/2023    5:00 PM  Readmission Risk Prevention Plan  Transportation Screening Complete Complete Complete  Medication Review Oceanographer) Complete Complete   PCP or Specialist appointment within 3-5 days of discharge Complete Complete   HRI or Home Care Consult Complete Complete   SW Recovery Care/Counseling Consult Complete Patient refused   Palliative Care Screening Not Applicable Not Applicable   Skilled Nursing Facility Not Applicable Not Applicable

## 2023-07-23 NOTE — Progress Notes (Signed)
Discharge instructions reviewed with pt. Copy of instructions given to pt. [facility] has filled one script and will be picked up on the way to the discharge lounge. Pt will go home via taxi with a taxi voucher. Pt provided with paper scrubs to wear home, she states she only has her shorts she came in with. Pt will be d/c'd via wheelchair with belongings and be escorted by staff to the discharge lounge.   Naisha Wisdom,RN SWOT

## 2023-07-23 NOTE — Progress Notes (Signed)
Mobility Specialist Progress Note;   07/23/23 1100  Mobility  Activity Ambulated with assistance in hallway  Level of Assistance Contact guard assist, steadying assist  Assistive Device None  Distance Ambulated (ft) 150 ft  Activity Response Tolerated well  Mobility Referral Yes  Mobility visit 1 Mobility  Mobility Specialist Start Time (ACUTE ONLY) 1100  Mobility Specialist Stop Time (ACUTE ONLY) 1120  Mobility Specialist Time Calculation (min) (ACUTE ONLY) 20 min   Pt on BSC upon arrival, agreeable to mobility and walking o2 test. Required MinG assistance during ambulation for safety. Took 2x standing rest breaks d/t fatigue, however VSS throughout. No c/o during session. Pt returned back to bed with all needs met, alarm on.   Caesar Bookman Mobility Specialist Please contact via SecureChat or [employer] [phone]

## 2023-07-23 NOTE — Progress Notes (Signed)
Nurse requested Mobility Specialist to perform oxygen saturation test with pt which includes removing pt from oxygen both at rest and while ambulating.  Below are the results from that testing.     Patient Saturations on Room Air at Rest = spO2 100%  Patient Saturations on Room Air while Ambulating = sp02 97% .    Patient Saturations on 0 Liters of oxygen while Ambulating = sp02 97%  At end of testing pt left in room on 0  Liters of oxygen.  Reported results to nurse.   Caesar Bookman Mobility Specialist Please contact via SecureChat or [employer] [phone]

## 2023-08-06 ENCOUNTER — Encounter (HOSPITAL_COMMUNITY): Payer: Self-pay | Admitting: Emergency Medicine

## 2023-08-06 ENCOUNTER — Observation Stay (HOSPITAL_COMMUNITY)
Admission: EM | Admit: 2023-08-06 | Discharge: 2023-08-11 | Disposition: A | Discharge status: Home / Self Care | Payer: MEDICAID | Attending: Family Medicine | Admitting: Family Medicine

## 2023-08-06 ENCOUNTER — Other Ambulatory Visit: Payer: Self-pay

## 2023-08-06 ENCOUNTER — Emergency Department (HOSPITAL_COMMUNITY): Payer: No Typology Code available for payment source

## 2023-08-06 DIAGNOSIS — Z66 Do not resuscitate: Secondary | ICD-10-CM | POA: Insufficient documentation

## 2023-08-06 DIAGNOSIS — E785 Hyperlipidemia, unspecified: Secondary | ICD-10-CM | POA: Diagnosis not present

## 2023-08-06 DIAGNOSIS — J45909 Unspecified asthma, uncomplicated: Secondary | ICD-10-CM | POA: Insufficient documentation

## 2023-08-06 DIAGNOSIS — G8929 Other chronic pain: Secondary | ICD-10-CM | POA: Diagnosis not present

## 2023-08-06 DIAGNOSIS — I130 Hypertensive heart and chronic kidney disease with heart failure and stage 1 through stage 4 chronic kidney disease, or unspecified chronic kidney disease: Secondary | ICD-10-CM | POA: Diagnosis not present

## 2023-08-06 DIAGNOSIS — N1832 Chronic kidney disease, stage 3b: Secondary | ICD-10-CM | POA: Insufficient documentation

## 2023-08-06 DIAGNOSIS — F319 Bipolar disorder, unspecified: Secondary | ICD-10-CM

## 2023-08-06 DIAGNOSIS — F411 Generalized anxiety disorder: Secondary | ICD-10-CM | POA: Diagnosis not present

## 2023-08-06 DIAGNOSIS — R0602 Shortness of breath: Secondary | ICD-10-CM | POA: Diagnosis present

## 2023-08-06 DIAGNOSIS — E039 Hypothyroidism, unspecified: Secondary | ICD-10-CM | POA: Insufficient documentation

## 2023-08-06 DIAGNOSIS — J441 Chronic obstructive pulmonary disease with (acute) exacerbation: Secondary | ICD-10-CM | POA: Insufficient documentation

## 2023-08-06 DIAGNOSIS — F17210 Nicotine dependence, cigarettes, uncomplicated: Secondary | ICD-10-CM | POA: Insufficient documentation

## 2023-08-06 DIAGNOSIS — E1165 Type 2 diabetes mellitus with hyperglycemia: Secondary | ICD-10-CM | POA: Diagnosis not present

## 2023-08-06 DIAGNOSIS — E1122 Type 2 diabetes mellitus with diabetic chronic kidney disease: Secondary | ICD-10-CM | POA: Insufficient documentation

## 2023-08-06 DIAGNOSIS — R079 Chest pain, unspecified: Principal | ICD-10-CM

## 2023-08-06 DIAGNOSIS — G894 Chronic pain syndrome: Secondary | ICD-10-CM | POA: Insufficient documentation

## 2023-08-06 DIAGNOSIS — E876 Hypokalemia: Secondary | ICD-10-CM | POA: Insufficient documentation

## 2023-08-06 DIAGNOSIS — Z1152 Encounter for screening for COVID-19: Secondary | ICD-10-CM | POA: Diagnosis not present

## 2023-08-06 DIAGNOSIS — R7989 Other specified abnormal findings of blood chemistry: Secondary | ICD-10-CM

## 2023-08-06 DIAGNOSIS — Z794 Long term (current) use of insulin: Secondary | ICD-10-CM | POA: Diagnosis not present

## 2023-08-06 DIAGNOSIS — I5023 Acute on chronic systolic (congestive) heart failure: Secondary | ICD-10-CM | POA: Diagnosis not present

## 2023-08-06 DIAGNOSIS — F419 Anxiety disorder, unspecified: Secondary | ICD-10-CM | POA: Insufficient documentation

## 2023-08-06 DIAGNOSIS — F259 Schizoaffective disorder, unspecified: Secondary | ICD-10-CM | POA: Insufficient documentation

## 2023-08-06 DIAGNOSIS — Z79899 Other long term (current) drug therapy: Secondary | ICD-10-CM | POA: Insufficient documentation

## 2023-08-06 DIAGNOSIS — Z7984 Long term (current) use of oral hypoglycemic drugs: Secondary | ICD-10-CM | POA: Diagnosis not present

## 2023-08-06 DIAGNOSIS — I509 Heart failure, unspecified: Secondary | ICD-10-CM

## 2023-08-06 LAB — CBC WITH DIFFERENTIAL/PLATELET
Abs Immature Granulocytes: 0.09 10*3/uL — ABNORMAL HIGH (ref 0.00–0.07)
Basophils Absolute: 0.1 10*3/uL (ref 0.0–0.1)
Basophils Relative: 1 %
Eosinophils Absolute: 0.1 10*3/uL (ref 0.0–0.5)
Eosinophils Relative: 1 %
HCT: 37.8 % (ref 36.0–46.0)
Hemoglobin: 11.8 g/dL — ABNORMAL LOW (ref 12.0–15.0)
Immature Granulocytes: 1 %
Lymphocytes Relative: 24 %
Lymphs Abs: 1.9 10*3/uL (ref 0.7–4.0)
MCH: 26.9 pg (ref 26.0–34.0)
MCHC: 31.2 g/dL (ref 30.0–36.0)
MCV: 86.3 fL (ref 80.0–100.0)
Monocytes Absolute: 0.7 10*3/uL (ref 0.1–1.0)
Monocytes Relative: 9 %
Neutro Abs: 5 10*3/uL (ref 1.7–7.7)
Neutrophils Relative %: 64 %
Platelets: 255 10*3/uL (ref 150–400)
RBC: 4.38 MIL/uL (ref 3.87–5.11)
RDW: 16.3 % — ABNORMAL HIGH (ref 11.5–15.5)
WBC: 7.8 10*3/uL (ref 4.0–10.5)
nRBC: 0.5 % — ABNORMAL HIGH (ref 0.0–0.2)

## 2023-08-06 LAB — CBC
HCT: 37.5 % (ref 36.0–46.0)
Hemoglobin: 11.6 g/dL — ABNORMAL LOW (ref 12.0–15.0)
MCH: 26.7 pg (ref 26.0–34.0)
MCHC: 30.9 g/dL (ref 30.0–36.0)
MCV: 86.2 fL (ref 80.0–100.0)
Platelets: 243 10*3/uL (ref 150–400)
RBC: 4.35 MIL/uL (ref 3.87–5.11)
RDW: 16.3 % — ABNORMAL HIGH (ref 11.5–15.5)
WBC: 9.4 10*3/uL (ref 4.0–10.5)
nRBC: 0.2 % (ref 0.0–0.2)

## 2023-08-06 LAB — COMPREHENSIVE METABOLIC PANEL
ALT: 15 U/L (ref 0–44)
AST: 17 U/L (ref 15–41)
Albumin: 3.2 g/dL — ABNORMAL LOW (ref 3.5–5.0)
Alkaline Phosphatase: 96 U/L (ref 38–126)
Anion gap: 12 (ref 5–15)
BUN: 26 mg/dL — ABNORMAL HIGH (ref 6–20)
CO2: 21 mmol/L — ABNORMAL LOW (ref 22–32)
Calcium: 8.6 mg/dL — ABNORMAL LOW (ref 8.9–10.3)
Chloride: 100 mmol/L (ref 98–111)
Creatinine, Ser: 1.28 mg/dL — ABNORMAL HIGH (ref 0.44–1.00)
GFR, Estimated: 50 mL/min — ABNORMAL LOW (ref >60)
Glucose, Bld: 466 mg/dL — ABNORMAL HIGH (ref 70–99)
Potassium: 3.7 mmol/L (ref 3.5–5.1)
Sodium: 133 mmol/L — ABNORMAL LOW (ref 135–145)
Total Bilirubin: 1.1 mg/dL (ref 0.0–1.2)
Total Protein: 6.1 g/dL — ABNORMAL LOW (ref 6.5–8.1)

## 2023-08-06 LAB — TROPONIN I (HIGH SENSITIVITY)
Troponin I (High Sensitivity): 81 ng/L — ABNORMAL HIGH (ref <18)
Troponin I (High Sensitivity): 88 ng/L — ABNORMAL HIGH (ref <18)

## 2023-08-06 LAB — RESP PANEL BY RT-PCR (RSV, FLU A&B, COVID)  RVPGX2
Influenza A by PCR: NEGATIVE
Influenza B by PCR: NEGATIVE
Resp Syncytial Virus by PCR: NEGATIVE
SARS Coronavirus 2 by RT PCR: NEGATIVE

## 2023-08-06 LAB — CBG MONITORING, ED
Glucose-Capillary: 451 mg/dL — ABNORMAL HIGH (ref 70–99)
Glucose-Capillary: 337 mg/dL — ABNORMAL HIGH (ref 70–99)
Glucose-Capillary: 277 mg/dL — ABNORMAL HIGH (ref 70–99)
Glucose-Capillary: 321 mg/dL — ABNORMAL HIGH (ref 70–99)

## 2023-08-06 LAB — GLUCOSE, CAPILLARY
Glucose-Capillary: 259 mg/dL — ABNORMAL HIGH (ref 70–99)
Glucose-Capillary: 246 mg/dL — ABNORMAL HIGH (ref 70–99)

## 2023-08-06 LAB — CREATININE, SERUM
Creatinine, Ser: 1.39 mg/dL — ABNORMAL HIGH (ref 0.44–1.00)
GFR, Estimated: 45 mL/min — ABNORMAL LOW (ref >60)

## 2023-08-06 LAB — BRAIN NATRIURETIC PEPTIDE: B Natriuretic Peptide: 3747.1 pg/mL — ABNORMAL HIGH (ref 0.0–100.0)

## 2023-08-06 LAB — HIV ANTIBODY (ROUTINE TESTING W REFLEX): HIV Screen 4th Generation wRfx: NONREACTIVE

## 2023-08-06 MED ORDER — SERTRALINE HCL 100 MG PO TABS
100.0000 mg | ORAL_TABLET | Freq: Every day | ORAL | Status: DC
Start: 2023-08-06 — End: 2023-08-06
  Administered 2023-08-06: 100 mg via ORAL
  Filled 2023-08-06: qty 1

## 2023-08-06 MED ORDER — INSULIN GLARGINE 100 UNIT/ML ~~LOC~~ SOLN
10.0000 [IU] | Freq: Every day | SUBCUTANEOUS | Status: DC
  Filled 2023-08-06: qty 0.1

## 2023-08-06 MED ORDER — ENOXAPARIN SODIUM 40 MG/0.4ML IJ SOSY
40.0000 mg | PREFILLED_SYRINGE | INTRAMUSCULAR | Status: DC
  Filled 2023-08-06 (×2): qty 0.4

## 2023-08-06 MED ORDER — INSULIN ASPART 100 UNIT/ML IV SOLN
10.0000 [IU] | Freq: Once | INTRAVENOUS | Status: AC
  Administered 2023-08-06: 5 [IU] via INTRAVENOUS

## 2023-08-06 MED ORDER — OXYCODONE-ACETAMINOPHEN 5-325 MG PO TABS
2.0000 | ORAL_TABLET | ORAL | Status: DC | PRN
  Administered 2023-08-06 – 2023-08-11 (×28): 2 via ORAL
  Filled 2023-08-06 (×29): qty 2

## 2023-08-06 MED ORDER — ONDANSETRON HCL 4 MG/2ML IJ SOLN
4.0000 mg | Freq: Four times a day (QID) | INTRAMUSCULAR | Status: DC | PRN
  Administered 2023-08-06 – 2023-08-11 (×10): 4 mg via INTRAVENOUS
  Filled 2023-08-06 (×11): qty 2

## 2023-08-06 MED ORDER — HYDROXYZINE HCL 25 MG PO TABS
50.0000 mg | ORAL_TABLET | Freq: Three times a day (TID) | ORAL | Status: DC | PRN
  Administered 2023-08-06 – 2023-08-10 (×5): 50 mg via ORAL
  Filled 2023-08-06 (×5): qty 2

## 2023-08-06 MED ORDER — INSULIN GLARGINE 100 UNIT/ML ~~LOC~~ SOLN
15.0000 [IU] | Freq: Every day | SUBCUTANEOUS | Status: DC
  Administered 2023-08-07 – 2023-08-10 (×5): 15 [IU] via SUBCUTANEOUS
  Filled 2023-08-06 (×7): qty 0.15

## 2023-08-06 MED ORDER — ONDANSETRON HCL 4 MG/2ML IJ SOLN
4.0000 mg | Freq: Once | INTRAMUSCULAR | Status: AC
  Administered 2023-08-06: 4 mg via INTRAVENOUS
  Filled 2023-08-06: qty 2

## 2023-08-06 MED ORDER — INSULIN ASPART 100 UNIT/ML IJ SOLN
0.0000 [IU] | Freq: Three times a day (TID) | INTRAMUSCULAR | Status: DC
  Administered 2023-08-06 (×2): 11 [IU] via SUBCUTANEOUS
  Administered 2023-08-07: 2 [IU] via SUBCUTANEOUS
  Administered 2023-08-07: 15 [IU] via SUBCUTANEOUS
  Administered 2023-08-08 (×2): 8 [IU] via SUBCUTANEOUS
  Administered 2023-08-08: 2 [IU] via SUBCUTANEOUS
  Administered 2023-08-09 – 2023-08-10 (×4): 5 [IU] via SUBCUTANEOUS
  Administered 2023-08-10: 3 [IU] via SUBCUTANEOUS
  Administered 2023-08-11: 8 [IU] via SUBCUTANEOUS
  Administered 2023-08-11: 11 [IU] via SUBCUTANEOUS

## 2023-08-06 MED ORDER — EMPAGLIFLOZIN 10 MG PO TABS
10.0000 mg | ORAL_TABLET | Freq: Every day | ORAL | Status: DC
  Administered 2023-08-07 – 2023-08-11 (×5): 10 mg via ORAL
  Filled 2023-08-06 (×5): qty 1

## 2023-08-06 MED ORDER — OXYCODONE-ACETAMINOPHEN 5-325 MG PO TABS
2.0000 | ORAL_TABLET | Freq: Four times a day (QID) | ORAL | Status: DC | PRN
  Administered 2023-08-06 (×2): 2 via ORAL
  Filled 2023-08-06 (×2): qty 2

## 2023-08-06 MED ORDER — INSULIN ASPART 100 UNIT/ML IJ SOLN
0.0000 [IU] | Freq: Every day | INTRAMUSCULAR | Status: DC
  Administered 2023-08-06: 3 [IU] via SUBCUTANEOUS
  Administered 2023-08-09: 2 [IU] via SUBCUTANEOUS

## 2023-08-06 MED ORDER — ONDANSETRON HCL 4 MG PO TABS
4.0000 mg | ORAL_TABLET | Freq: Four times a day (QID) | ORAL | Status: DC | PRN
  Administered 2023-08-09: 4 mg via ORAL
  Filled 2023-08-06 (×2): qty 1

## 2023-08-06 MED ORDER — ALBUTEROL SULFATE (2.5 MG/3ML) 0.083% IN NEBU: 3.0000 mL | INHALATION_SOLUTION | Freq: Four times a day (QID) | RESPIRATORY_TRACT | Status: DC | PRN

## 2023-08-06 MED ORDER — HYDROMORPHONE HCL 1 MG/ML IJ SOLN
1.0000 mg | Freq: Once | INTRAMUSCULAR | Status: AC
  Administered 2023-08-06: 1 mg via INTRAVENOUS
  Filled 2023-08-06: qty 1

## 2023-08-06 MED ORDER — SODIUM CHLORIDE 0.9% FLUSH
3.0000 mL | Freq: Two times a day (BID) | INTRAVENOUS | Status: DC
  Administered 2023-08-06 – 2023-08-11 (×11): 3 mL via INTRAVENOUS

## 2023-08-06 MED ORDER — ARIPIPRAZOLE 5 MG PO TABS
5.0000 mg | ORAL_TABLET | Freq: Every day | ORAL | Status: DC
  Administered 2023-08-06 – 2023-08-11 (×6): 5 mg via ORAL
  Filled 2023-08-06 (×6): qty 1

## 2023-08-06 MED ORDER — FUROSEMIDE 10 MG/ML IJ SOLN
80.0000 mg | Freq: Once | INTRAMUSCULAR | Status: AC
  Administered 2023-08-06: 80 mg via INTRAVENOUS
  Filled 2023-08-06: qty 8

## 2023-08-06 MED ORDER — MIDODRINE HCL 5 MG PO TABS
10.0000 mg | ORAL_TABLET | Freq: Three times a day (TID) | ORAL | Status: DC
  Administered 2023-08-06 – 2023-08-11 (×15): 10 mg via ORAL
  Filled 2023-08-06 (×15): qty 2

## 2023-08-06 MED ORDER — IPRATROPIUM-ALBUTEROL 0.5-2.5 (3) MG/3ML IN SOLN: 3.0000 mL | Freq: Four times a day (QID) | RESPIRATORY_TRACT | Status: DC | PRN

## 2023-08-06 MED ORDER — HYDROMORPHONE HCL 1 MG/ML IJ SOLN
0.2000 mg | Freq: Once | INTRAMUSCULAR | Status: AC
  Administered 2023-08-06: 0.2 mg via INTRAVENOUS
  Filled 2023-08-06: qty 0.5

## 2023-08-06 MED ORDER — ACETAMINOPHEN 650 MG RE SUPP: 650.0000 mg | Freq: Four times a day (QID) | RECTAL | Status: DC | PRN

## 2023-08-06 MED ORDER — FENTANYL CITRATE PF 50 MCG/ML IJ SOSY
50.0000 ug | PREFILLED_SYRINGE | Freq: Once | INTRAMUSCULAR | Status: AC
  Administered 2023-08-06: 50 ug via INTRAVENOUS
  Filled 2023-08-06: qty 1

## 2023-08-06 MED ORDER — ACETAMINOPHEN 325 MG PO TABS
650.0000 mg | ORAL_TABLET | Freq: Four times a day (QID) | ORAL | Status: DC | PRN
  Filled 2023-08-06: qty 2
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
## 2023-08-06 NOTE — Plan of Care (Signed)
  Problem: Fluid Volume: Goal: Ability to maintain a balanced intake and output will improve Outcome: Progressing   Problem: Nutritional: Goal: Maintenance of adequate nutrition will improve Outcome: Progressing   Problem: Education: Goal: Knowledge of General Education information will improve Description: Including pain rating scale, medication(s)/side effects and non-pharmacologic comfort measures Outcome: Progressing

## 2023-08-06 NOTE — ED Notes (Signed)
 Psychiatry at bedside.

## 2023-08-06 NOTE — TOC Progression Note (Signed)
 Transition of Care (TOC) - Progression Note  Donn Pierini RN, BSN Transitions of Care Unit 4E- RN Case Manager See Treatment Team for direct phone #   Patient Details  Name: [name] [name] MRN: [id number] Date of Birth: 1970/11/09  Transition of Care [facility]) CM/SW Contact  Zenda Alpers, Lenn Sink, RN Phone Number: 08/06/2023, 2:54 PM  Clinical Narrative:    Per chart review from last admit- noted [facility] was unable to service pt for Hospice needs-  [facility] took referral and was noted plan to see pt on 2/13.   CM called [facility] liaison- Dahlia Client- who informed this CM that after insurance verification they found out that pt was OON- and had no Hospice benefits. They attempted to call pt and pt's son- without success to inform them that they would not be able to service.   TOC to follow for additional needs.         Expected Discharge Plan and Services        TBD                                       Social Determinants of Health (SDOH) Interventions SDOH Screenings   Food Insecurity: No Food Insecurity (08/06/2023)  Recent Concern: Food Insecurity - Food Insecurity Present (07/06/2023)  Housing: High Risk (08/06/2023)  Transportation Needs: No Transportation Needs (08/06/2023)  Utilities: Not At Risk (08/06/2023)  Alcohol Screen: Low Risk  (02/12/2023)  Depression (PHQ2-9): High Risk (09/30/2022)  Financial Resource Strain: High Risk (09/30/2022)  Social Connections: Moderately Isolated (08/06/2023)  Stress: No Stress Concern Present (01/07/2022)   Received from [facility]  Tobacco Use: High Risk (08/06/2023)    Readmission Risk Interventions    07/23/2023   12:02 PM 07/10/2023    1:21 PM 02/12/2023    5:00 PM  Readmission Risk Prevention Plan  Transportation Screening Complete Complete Complete  Medication Review Oceanographer) Complete Complete   PCP or Specialist appointment within 3-5 days of discharge Complete  Complete   HRI or Home Care Consult Complete Complete   SW Recovery Care/Counseling Consult Complete Patient refused   Palliative Care Screening Not Applicable Not Applicable   Skilled Nursing Facility Not Applicable Not Applicable

## 2023-08-06 NOTE — Progress Notes (Signed)
 Patient has repeatedly been asking for increased pain medicine since arrival.  She specifically has been asking for Dilaudid.  She primarily complains of constant chest pain which is consistent with costochondritis.  She has chronic pain and per her report receives her medications from a pain clinic.  Based on clinical exam she has no needs to require IV Dilaudid.  I have increased the frequency of her home Percocet and will add Vistaril for anxiety.

## 2023-08-06 NOTE — Progress Notes (Signed)
 Heart Failure Navigator Progress Note  Assessed for Heart & Vascular TOC clinic readiness.  Patient does not meet criteria due to Advanced Heart Failure Team patient of Dr. Gasper Lloyd.   Navigator will sign off at this time.   Rhae Hammock, BSN, Scientist, clinical (histocompatibility and immunogenetics) Only

## 2023-08-06 NOTE — H&P (Addendum)
 History and Physical    Patient: [name] [name]:[id number] DOB: 07/10/1970 DOA: 08/06/2023 DOS: the patient was seen and examined on 08/06/2023 PCP: [facility]  Patient coming from: Home-family lives with her and assist with appointments.  Patient does not drive  Medical readiness/disposition: Anticipate patient may be ready to discharge as soon as 08/07/2023.  Anticipate discharge back to home.  Have requested home health RN follow-up.  Psych has made adjustments in her medications this admission.  She will also need hospice and other palliative care follow-up after discharge.  Chief Complaint:  Chief Complaint  Patient presents with   Shortness of Breath   Hyperglycemia   HPI: [name] [name] is a 53 y.o. female with medical history significant of underlying bipolar disorder, anxiety disorder, ongoing tobacco use and underlying COPD, hypertension, HFrEF secondary to severe mitral regurgitation that is not amenable to repair procedure, hypothyroidism, CKD stage III.  Patient was discharged several weeks ago after an admission for shortness of breath.  Even after effective treatment for her CHF with no clinical signs of active CHF exacerbation patient continued to have shortness of breath at rest and with activity.  Discussions were held with the patient by her primary cardiologist as well as the hospitalist provider regarding her poor prognosis in regards to her end-stage CHF in relation to severe mitral valve disease that is not amenable to surgery.  During that hospitalization the patient decided on a DNR status and was amenable to enrolling in hospice.  Patient return to the ER on 2/25 via EMS with reports of nausea, hyperglycemia and shortness of breath x 1 day.  Initial CBG was 557 and EMS initiated 10 units of insulin.  She was also given 200 cc of LR and 4 mg of Zofran.  She was tachycardic, mildly tachypneic, room air sats were 97% and her BP was 124/76.  Evaluation in the ER  revealed no evidence of peripheral edema.  Chest x-ray without evidence of CHF or other pulmonary vascular edema.  She did have low-grade fever of 99.5.  Her sats have remained stable on room air at rest.  Her vital signs have remained stable for her.  Her initial BNP was 3747, her troponin was elevated at 81 and 88 with a flat trend.  She had no leukocytosis.  BUN was 26 and creatinine was 1.28.  Serum glucose was 466.  PCR for influenza, COVID and RSV was negative.  Hospital service has agreed to evaluate the patient for admission.  Upon my evaluation of the patient she was very anxious was discussing her issues related to ongoing dyspnea and dyspnea on exertion as well as orthopnea and repeatedly states "I do not know how I can live like this, I do not know how they think I can live like this".  She reported inconsistent use of oxygen stating she really did not think it would help her.  Patient reports that she continues to smoke.  She reports ongoing constant chest pain and a sensation that she needs to clear her throat.  She states her home Percocet prescribed by pain clinic is not working.  She stated the fentanyl given in the ED did not work but the Dilaudid did.  She continues to report nausea and is requesting medication to treat this.  She reports that her sugars have been increasing and that her PCP put her back on metformin.  She states she is essentially inactive.  She has not had any fevers or chills.  Review of Systems: As mentioned in the history of present illness. All other systems reviewed and are negative. Past Medical History:  Diagnosis Date   Anxiety disorder    Asthma    Bipolar affective disorder (HCC)    Blood transfusion without reported diagnosis    CHF (congestive heart failure) (HCC)    COPD (chronic obstructive pulmonary disease) (HCC)    Depression    Diabetes mellitus    Migraine    Past Surgical History:  Procedure Laterality Date   KNEE ARTHROSCOPY     x 2    KNEE SURGERY Left    Incision made and knee cleaned out   TUBAL LIGATION     Social History:  reports that she has been smoking cigarettes. She has a 29 pack-year smoking history. She has never used smokeless tobacco. She reports that she does not currently use alcohol. She reports that she does not use drugs.  Allergies  Allergen Reactions   Tramadol Nausea Only and Rash   Wellbutrin [Bupropion] Other (See Comments)    Behavioral changes (aggression, irritability)   Benadryl [Diphenhydramine] Other (See Comments)    Decreases motor skills and increases anxiety   Lactose Intolerance (Gi) Diarrhea   Nicotine Rash    Patient states she is allergic to the Nicotine patch and they cause her to break out in a rash.  She states she can smoke cigarettes.    Family History  Problem Relation Age of Onset   Bipolar disorder Mother    Hypertension Father    Diabetes Father     Prior to Admission medications   Medication Sig Start Date End Date Taking? Authorizing Provider  albuterol (VENTOLIN HFA) 108 (90 Base) MCG/ACT inhaler Inhale 2 puffs into the lungs every 6 (six) hours as needed for wheezing or shortness of breath. 09/27/22  Yes Arrien, York Ram, MD  metFORMIN (GLUCOPHAGE) 1000 MG tablet Take 1,000 mg by mouth 2 (two) times daily. 07/29/23  Yes [provider]  oxyCODONE-acetaminophen (PERCOCET/ROXICET) 5-325 MG tablet Take 1 tablet by mouth 2 (two) times daily as needed. 07/29/23  Yes [provider]  bumetanide (BUMEX) 1 MG tablet Take 1 tablet (1 mg total) by mouth daily. 05/27/23 06/26/23  Arrien, York Ram, MD  empagliflozin (JARDIANCE) 10 MG TABS tablet Take 1 tablet (10 mg total) by mouth daily. 07/23/23 08/22/23  Hughie Closs, MD  glucose blood (ONETOUCH VERIO) test strip Use three times daily as directed to check blood sugar 02/11/23   Alexander-Savino, [place], MD  insulin glargine, 1 Unit Dial, (TOUJEO SOLOSTAR) 300 UNIT/ML Solostar Pen Inject 10 Units  into the skin at bedtime. Take 15 units daily for 2 days while on prednisone and then go back to your home dose of 10 units daily at bedtime 07/10/23 08/09/23  Zannie Cove, MD  Insulin Pen Needle 32G X 4 MM MISC Use 3 (three) times daily. 02/11/23   Alexander-Savino, [place], MD  ipratropium-albuterol (DUONEB) 0.5-2.5 (3) MG/3ML SOLN inhale 3 mLs by nebulization every 6 (six) hours as needed. Patient taking differently: Take 3 mLs by nebulization every 6 (six) hours as needed (for shortness of breath). 05/27/23   Arrien, York Ram, MD  Lancet Devices [facility] DELICA PLUS LANCING) MISC Use 3 (three) times daily. 02/11/23   Alexander-Savino, [place], MD  Lurasidone HCl 60 MG TABS Take 1 tablet (60 mg total) by mouth daily with supper. 05/27/23 06/26/23  Arrien, York Ram, MD  midodrine (PROAMATINE) 10 MG tablet Take 1 tablet (10 mg total)  by mouth 3 (three) times daily with meals. 05/27/23   Arrien, York Ram, MD  mometasone-formoterol [facility]) 200-5 MCG/ACT AERO Inhale 2 puffs into the lungs 2 (two) times daily. 05/27/23 06/26/23  Arrien, York Ram, MD  OneTouch Delica Lancets 33G MISC Use 3 (three) times daily as directed to check blood sugar 02/11/23   Alexander-Savino, [place], MD  oxyCODONE-acetaminophen (PERCOCET) 10-325 MG tablet Take 1 tablet by mouth every 6 (six) hours as needed for pain. 09/02/22   Wouk, Wilfred Curtis, MD  promethazine (PHENERGAN) 25 MG tablet Take 25 mg by mouth every 6 (six) hours as needed for nausea or vomiting.    [provider]  rosuvastatin (CRESTOR) 20 MG tablet Take 1 tablet (20 mg total) by mouth daily. 05/27/23 06/26/23  Arrien, York Ram, MD  sertraline (ZOLOFT) 100 MG tablet Take 1 tablet (100 mg total) by mouth daily. 05/27/23   Arrien, York Ram, MD  benztropine (COGENTIN) 1 MG tablet Take 1 tablet (1 mg total) by mouth 2 (two) times daily. 12/23/18 06/04/20  Malvin Johns, MD  insulin aspart (NOVOLOG) 100 UNIT/ML injection  Inject 0-9 Units into the skin 3 (three) times daily with meals. Patient not taking: Reported on 06/03/2020 12/16/18 06/04/20  Lanae Boast, MD    Physical Exam: Vitals:   08/06/23 0630 08/06/23 0658 08/06/23 0700 08/06/23 0715  BP: 119/88  111/79 108/74  Pulse: 92  98 97  Resp: 17  18 17   Temp:  98.1 F (36.7 C)    TempSrc:  Oral    SpO2: 98%  98% 99%  Weight:      Height:       Constitutional: NAD, very anxious in regards to her ongoing respiratory symptoms, uncomfortable secondary to constant anterior chest wall pain Respiratory: Notable with diffuse expiratory fine wheezes on posterior exam, no increased work of breathing, mains on room air with sats 97 to 98% at rest Cardiovascular: Regular rate and rhythm, no murmurs / rubs / gallops. No extremity edema. 2+ pedal pulses. Abdomen: no tenderness, no masses palpated. No obvious hepatosplenomegaly. Bowel sounds positive.  Musculoskeletal: no clubbing / cyanosis. No joint deformity upper and lower extremities. Good ROM, no contractures. Normal muscle tone.  Skin: no rashes, lesions, ulcers. No induration Neurologic: CN 2-12 grossly intact. Sensation intact, DTR normal. Strength 5/5 x all 4 extremities.  Psychiatric: Normal judgment and insight. Alert and oriented x 3.  Very anxious mood.-Also expresses hopelessness regarding her ongoing respiratory and cardiac symptoms  Data Reviewed:  As per HPI  Assessment and Plan: Acute COPD exacerbation (mild)/ongoing tobacco abuse Exam having expiratory wheezing which may be contributing to patient's respiratory symptoms although a majority of her symptoms have been proven to be subjective in nature Will provide scheduled DuoNebs with as needed albuterol nebs for breakthrough symptoms Given recent hyperglycemia and otherwise good airway movement we will try to avoid steroids at this juncture  Chronic dyspnea During previous admission patient's room air sats at rest were 100% and remained  stable with ambulation on room air From my discussion with psych team likely subjective in nature due to patient's poor prognosis and significant anxiety regarding same Patient remains short of breath constantly despite clinical signs that do not support heart failure exacerbation Treat underlying causes i.e. anxiety-see below  Chronic end-stage HFrEF secondary to nonrepairable severe mitral valve regurgitation Previous admission cardiology had a long talk with the patient noting that her shortness of breath was primarily due to low output heart failure and unfortunately after multiple  evaluations by multiple healthcare team she is not a candidate for advanced heart failure therapies and she needs to expect she will have limitations due to her symptoms and symptoms will worsen as her heart failure progresses In addition a lengthy discussion was held regarding end of care and patient requested no advanced interventions and would like to pass as peacefully as she could Currently no signs of acute heart failure exacerbation so we will continue her home regimen as she tolerates Bumex 1 mg daily Midodrine 2 mg 3 times daily for BP support in context of low output heart failure Continue Jardiance as well as part of her GDMT Daily weights with strict intake and output  Bipolar disorder Patient continues to have significant anxiety regarding her above conditions and this is influencing her subjective dyspnea increasing her anxiety levels Patient had been placed on Zoloft prior to this admission - she reports that this was done by her pain medicine doctor-unclear why she was no longer on Latuda Appreciate assistance from psychiatric team.  They agree that patient has subjective dyspnea and that her anxiety levels are making the symptoms worse. I have discontinued Zoloft in favor of Abilify twice daily They also recommended to consult palliative medicine to assist with emotional support and education  regarding her condition  TOC consulted to assist in following up regarding the previous patient hospice consultation as well as arrange home health RN for follow-up  Diabetes mellitus 2 with hyperglycemia on chronic insulin Patient initially presented with CBG almost 600 without any acidosis.  She reported she had been taking her home medications as prescribed She states her primary care physician had resumed her metformin but given her low output heart failure and current chronic kidney disease stage III we will not reorder this medication Have increased her insulin glargine from 10 units to 15 units at bedtime Continue to follow CBGs and provide SSI Continue Jardiance  CKD 3b Baseline creatinine anywhere from 0.96-1.75 Current creatinine 1.28  HLD Continue Crestor  Chronic pain Continue home dose Percocet 10/325 every 6 hours as needed for pain Patient reports she attends a pain clinic so likely has an agreement with the pain clinic regarding use of narcotics At this juncture I do not see any reason to make any changes in her baseline pain medications.  It is important to note she is requesting Dilaudid for pain.  I think the Dilaudid may help her with her anxiety and this is why she is requesting this medication     Advance Care Planning:   Code Status: Do not attempt resuscitation (DNR) PRE-ARREST INTERVENTIONS DESIRED   VTE prophylaxis: Lovenox  Consults: Psychiatric team, palliative medicine team  Family Communication: Patient only  Severity of Illness: The appropriate patient status for this patient is OBSERVATION. Observation status is judged to be reasonable and necessary in order to provide the required intensity of service to ensure the patient's safety. The patient's presenting symptoms, physical exam findings, and initial radiographic and laboratory data in the context of their medical condition is felt to place them at decreased risk for further clinical deterioration.  Furthermore, it is anticipated that the patient will be medically stable for discharge from the hospital within 2 midnights of admission.   Author: Junious Silk, NP 08/06/2023 12:45 PM  For on call review [email or web address].   Attending MD note  Patient was independently seen and examined, treatment plan was discussed with the  Advance Practice Provider. I have personally reviewed the clinical findings, labs, ECG,  imaging studies and management of this patient in detail. I have also reviewed the orders written for this patient which were under my direction. I agree with the documentation, as recorded by the Advance Practice Provider.   Briefly, [name] [name] is a 53 y.o. female with recent admission for acute on chronic combined CHF, MV disease, IDT2DM, COPD, tobacco use, chronic pain syndrome who was recently discharged on 2/12 home with hospice returned 2/26 early this morning for persistence of shortness of breath that occurs mostly in spells. She was sent home with hospice but never heard from hospice after she left the hospital despite her calling.   She is anxious appearing but in no distress with clear lungs, RRR with II/VI apical systolic murmur, minimal LE edema, alert and oriented. SpO2 remains in high 90%'s throughout encounter with normal respiratory effort.   Agree with assessment and plan as formulated above, appreciate psychiatry evaluation and look forward to palliative care assessment and provision of options for her as well as further delineation of her goals of care.   Given her dyspnea without wheezing, her concomitant anxiety and hyperglycemia, will treat for mild AECOPD trying to avoid systemic steroids. She appears roughly euvolemic.   Hazeline Junker, MD 08/06/2023 6:43 PM

## 2023-08-06 NOTE — ED Triage Notes (Signed)
 Pt BIB EMS from home with c/o nausea, hyperglycemia and SHOB x 1 day. 10u of insulin at 2000, cbg 557 with ems. Waiting for hospice to contact her.   LR 4mg  zofran  124/76 114HR 25RR 97 RA

## 2023-08-06 NOTE — ED Provider Notes (Signed)
 [place] EMERGENCY DEPARTMENT AT [facility] Provider Note   CSN: [id number] Arrival date & time: 08/06/23  0143     History  Chief Complaint  Patient presents with   Shortness of Breath   Hyperglycemia    [name] [name] is a 53 y.o. female.  53 yo F w/ h/o CHF and COPD here with sob similar to previous episodes of chf exacerbation. Worsening over last couple days. Some abdominal blaoting with it. No fever, cough. Compliant with medications.    Shortness of Breath Hyperglycemia Associated symptoms: shortness of breath        Home Medications Prior to Admission medications   Medication Sig Start Date End Date Taking? Authorizing Provider  albuterol (VENTOLIN HFA) 108 (90 Base) MCG/ACT inhaler Inhale 2 puffs into the lungs every 6 (six) hours as needed for wheezing or shortness of breath. 09/27/22   Arrien, York Ram, MD  bumetanide (BUMEX) 1 MG tablet Take 1 tablet (1 mg total) by mouth daily. 05/27/23 06/26/23  Arrien, York Ram, MD  empagliflozin (JARDIANCE) 10 MG TABS tablet Take 1 tablet (10 mg total) by mouth daily. 07/23/23 08/22/23  Hughie Closs, MD  glucose blood (ONETOUCH VERIO) test strip Use three times daily as directed to check blood sugar 02/11/23   Alexander-Savino, [place], MD  insulin glargine, 1 Unit Dial, (TOUJEO SOLOSTAR) 300 UNIT/ML Solostar Pen Inject 10 Units into the skin at bedtime. Take 15 units daily for 2 days while on prednisone and then go back to your home dose of 10 units daily at bedtime 07/10/23 08/09/23  Zannie Cove, MD  Insulin Pen Needle 32G X 4 MM MISC Use As Directed 09/30/22   Arrien, York Ram, MD  Insulin Pen Needle 32G X 4 MM MISC Use 3 (three) times daily. 02/11/23   Alexander-Savino, [place], MD  ipratropium-albuterol (DUONEB) 0.5-2.5 (3) MG/3ML SOLN inhale 3 mLs by nebulization every 6 (six) hours as needed. Patient taking differently: Take 3 mLs by nebulization every 6 (six) hours as needed (for shortness  of breath). 05/27/23   Arrien, York Ram, MD  Lancet Devices [facility] DELICA PLUS LANCING) MISC Use 3 (three) times daily. 02/11/23   Alexander-Savino, [place], MD  Lurasidone HCl 60 MG TABS Take 1 tablet (60 mg total) by mouth daily with supper. 05/27/23 06/26/23  Arrien, York Ram, MD  midodrine (PROAMATINE) 10 MG tablet Take 1 tablet (10 mg total) by mouth 3 (three) times daily with meals. 05/27/23   Arrien, York Ram, MD  mometasone-formoterol [facility]) 200-5 MCG/ACT AERO Inhale 2 puffs into the lungs 2 (two) times daily. 05/27/23 06/26/23  Arrien, York Ram, MD  OneTouch Delica Lancets 33G MISC Use 3 (three) times daily as directed to check blood sugar 02/11/23   Alexander-Savino, [place], MD  oxyCODONE-acetaminophen (PERCOCET) 10-325 MG tablet Take 1 tablet by mouth every 6 (six) hours as needed for pain. 09/02/22   Wouk, Wilfred Curtis, MD  promethazine (PHENERGAN) 25 MG tablet Take 25 mg by mouth every 6 (six) hours as needed for nausea or vomiting.    [provider]  rosuvastatin (CRESTOR) 20 MG tablet Take 1 tablet (20 mg total) by mouth daily. 05/27/23 06/26/23  Arrien, York Ram, MD  sertraline (ZOLOFT) 100 MG tablet Take 1 tablet (100 mg total) by mouth daily. 05/27/23   Arrien, York Ram, MD  benztropine (COGENTIN) 1 MG tablet Take 1 tablet (1 mg total) by mouth 2 (two) times daily. 12/23/18 06/04/20  Malvin Johns, MD  insulin aspart (NOVOLOG) 100 UNIT/ML  injection Inject 0-9 Units into the skin 3 (three) times daily with meals. Patient not taking: Reported on 06/03/2020 12/16/18 06/04/20  Lanae Boast, MD      Allergies    Tramadol, Wellbutrin [bupropion], Benadryl [diphenhydramine], Lactose intolerance (gi), and Nicotine    Review of Systems   Review of Systems  Respiratory:  Positive for shortness of breath.     Physical Exam Updated Vital Signs BP 119/88   Pulse 92   Temp 99.5 F (37.5 C)   Resp 17   Ht 5\' 5"  (1.651 m)   Wt 70.6 kg    LMP  (LMP Unknown) Comment: Pt is poor historian  SpO2 98%   BMI 25.90 kg/m  Physical Exam Vitals and nursing note reviewed.  Constitutional:      Appearance: She is well-developed.  HENT:     Head: Normocephalic and atraumatic.  Cardiovascular:     Rate and Rhythm: Normal rate and regular rhythm.  Pulmonary:     Effort: No respiratory distress.     Breath sounds: No stridor. Rales (in the bases) present.  Abdominal:     General: There is no distension.  Musculoskeletal:     Cervical back: Normal range of motion.  Neurological:     Mental Status: She is alert.     ED Results / Procedures / Treatments   Labs (all labs ordered are listed, but only abnormal results are displayed) Labs Reviewed  CBC WITH DIFFERENTIAL/PLATELET - Abnormal; Notable for the following components:      Result Value   Hemoglobin 11.8 (*)    RDW 16.3 (*)    nRBC 0.5 (*)    Abs Immature Granulocytes 0.09 (*)    All other components within normal limits  COMPREHENSIVE METABOLIC PANEL - Abnormal; Notable for the following components:   Sodium 133 (*)    CO2 21 (*)    Glucose, Bld 466 (*)    BUN 26 (*)    Creatinine, Ser 1.28 (*)    Calcium 8.6 (*)    Total Protein 6.1 (*)    Albumin 3.2 (*)    GFR, Estimated 50 (*)    All other components within normal limits  BRAIN NATRIURETIC PEPTIDE - Abnormal; Notable for the following components:   B Natriuretic Peptide 3,747.1 (*)    All other components within normal limits  CBG MONITORING, ED - Abnormal; Notable for the following components:   Glucose-Capillary 451 (*)    All other components within normal limits  CBG MONITORING, ED - Abnormal; Notable for the following components:   Glucose-Capillary 337 (*)    All other components within normal limits  TROPONIN I (HIGH SENSITIVITY) - Abnormal; Notable for the following components:   Troponin I (High Sensitivity) 81 (*)    All other components within normal limits  TROPONIN I (HIGH SENSITIVITY) -  Abnormal; Notable for the following components:   Troponin I (High Sensitivity) 88 (*)    All other components within normal limits  RESP PANEL BY RT-PCR (RSV, FLU A&B, COVID)  RVPGX2    EKG None  Radiology DG Chest Portable 1 View Result Date: 08/06/2023 CLINICAL DATA:  Shortness of breath EXAM: PORTABLE CHEST 1 VIEW COMPARISON:  07/22/2023 FINDINGS: Stable cardiomegaly. No frank interstitial edema. No pleural effusion or pneumothorax. IMPRESSION: Stable cardiomegaly. No frank interstitial edema. Electronically Signed   By: Charline Bills M.D.   On: 08/06/2023 02:14    Procedures Procedures    Medications Ordered in ED Medications  fentaNYL (  SUBLIMAZE) injection 50 mcg (50 mcg Intravenous Given 08/06/23 0308)  ondansetron (ZOFRAN) injection 4 mg (4 mg Intravenous Given 08/06/23 0308)  furosemide (LASIX) injection 80 mg (80 mg Intravenous Given 08/06/23 0358)  HYDROmorphone (DILAUDID) injection 1 mg (1 mg Intravenous Given 08/06/23 0601)  insulin aspart (novoLOG) injection 10 Units (5 Units Intravenous Given 08/06/23 0617)    ED Course/ Medical Decision Making/ A&P                                 Medical Decision Making Amount and/or Complexity of Data Reviewed Labs: ordered. Radiology: ordered. ECG/medicine tests: ordered.  Risk OTC drugs. Prescription drug management. Decision regarding hospitalization.   Persistent pain with troponins above baseline, no response to lasix administration so discussed with medicine for admission.    Final Clinical Impression(s) / ED Diagnoses Final diagnoses:  Chest pain, unspecified type  Elevated troponin  Acute on chronic congestive heart failure, unspecified heart failure type [facility])    Rx / DC Orders ED Discharge Orders     None         Jasa Dundon, Barbara Cower, MD 08/06/23 [phone]

## 2023-08-07 DIAGNOSIS — R079 Chest pain, unspecified: Secondary | ICD-10-CM

## 2023-08-07 DIAGNOSIS — Z7189 Other specified counseling: Secondary | ICD-10-CM | POA: Diagnosis not present

## 2023-08-07 DIAGNOSIS — F319 Bipolar disorder, unspecified: Secondary | ICD-10-CM

## 2023-08-07 DIAGNOSIS — G8929 Other chronic pain: Secondary | ICD-10-CM | POA: Diagnosis not present

## 2023-08-07 DIAGNOSIS — Z515 Encounter for palliative care: Secondary | ICD-10-CM

## 2023-08-07 DIAGNOSIS — N1832 Chronic kidney disease, stage 3b: Secondary | ICD-10-CM | POA: Diagnosis not present

## 2023-08-07 DIAGNOSIS — I5023 Acute on chronic systolic (congestive) heart failure: Principal | ICD-10-CM

## 2023-08-07 DIAGNOSIS — Z66 Do not resuscitate: Secondary | ICD-10-CM

## 2023-08-07 DIAGNOSIS — E039 Hypothyroidism, unspecified: Secondary | ICD-10-CM | POA: Diagnosis not present

## 2023-08-07 DIAGNOSIS — F411 Generalized anxiety disorder: Secondary | ICD-10-CM | POA: Diagnosis not present

## 2023-08-07 LAB — GLUCOSE, CAPILLARY
Glucose-Capillary: 346 mg/dL — ABNORMAL HIGH (ref 70–99)
Glucose-Capillary: 119 mg/dL — ABNORMAL HIGH (ref 70–99)
Glucose-Capillary: 412 mg/dL — ABNORMAL HIGH (ref 70–99)
Glucose-Capillary: 148 mg/dL — ABNORMAL HIGH (ref 70–99)
Glucose-Capillary: 167 mg/dL — ABNORMAL HIGH (ref 70–99)
Glucose-Capillary: 205 mg/dL — ABNORMAL HIGH (ref 70–99)

## 2023-08-07 LAB — COMPREHENSIVE METABOLIC PANEL
ALT: 22 U/L (ref 0–44)
AST: 32 U/L (ref 15–41)
Albumin: 3.4 g/dL — ABNORMAL LOW (ref 3.5–5.0)
Alkaline Phosphatase: 101 U/L (ref 38–126)
Anion gap: 18 — ABNORMAL HIGH (ref 5–15)
BUN: 36 mg/dL — ABNORMAL HIGH (ref 6–20)
CO2: 24 mmol/L (ref 22–32)
Calcium: 8.6 mg/dL — ABNORMAL LOW (ref 8.9–10.3)
Chloride: 94 mmol/L — ABNORMAL LOW (ref 98–111)
Creatinine, Ser: 1.58 mg/dL — ABNORMAL HIGH (ref 0.44–1.00)
GFR, Estimated: 39 mL/min — ABNORMAL LOW (ref >60)
Glucose, Bld: 76 mg/dL (ref 70–99)
Potassium: 3.1 mmol/L — ABNORMAL LOW (ref 3.5–5.1)
Sodium: 136 mmol/L (ref 135–145)
Total Bilirubin: 0.8 mg/dL (ref 0.0–1.2)
Total Protein: 6.6 g/dL (ref 6.5–8.1)

## 2023-08-07 LAB — CBC
HCT: 43.1 % (ref 36.0–46.0)
Hemoglobin: 13.2 g/dL (ref 12.0–15.0)
MCH: 26.8 pg (ref 26.0–34.0)
MCHC: 30.6 g/dL (ref 30.0–36.0)
MCV: 87.4 fL (ref 80.0–100.0)
Platelets: 225 10*3/uL (ref 150–400)
RBC: 4.93 MIL/uL (ref 3.87–5.11)
RDW: 16.3 % — ABNORMAL HIGH (ref 11.5–15.5)
WBC: 9.1 10*3/uL (ref 4.0–10.5)
nRBC: 0.2 % (ref 0.0–0.2)

## 2023-08-07 LAB — TROPONIN I (HIGH SENSITIVITY): Troponin I (High Sensitivity): 42 ng/L — ABNORMAL HIGH (ref <18)

## 2023-08-07 MED ORDER — POTASSIUM CHLORIDE CRYS ER 20 MEQ PO TBCR
40.0000 meq | EXTENDED_RELEASE_TABLET | Freq: Once | ORAL | Status: AC
  Administered 2023-08-07: 40 meq via ORAL
  Filled 2023-08-07: qty 2

## 2023-08-07 MED ORDER — PROMETHAZINE HCL 25 MG PO TABS
25.0000 mg | ORAL_TABLET | Freq: Once | ORAL | Status: AC
Start: 2023-08-07 — End: 2023-08-07
  Administered 2023-08-07: 25 mg via ORAL
  Filled 2023-08-07: qty 1

## 2023-08-07 NOTE — Progress Notes (Signed)
 Nurse requested Mobility Specialist to perform oxygen saturation test with pt which includes removing pt from oxygen both at rest and while ambulating.  Below are the results from that testing.     Patient Saturations on Room Air at Rest = spO2 95%  Patient Saturations on Room Air while Ambulating = sp02 95% . Pt rested and performed pursed lip breathing for 1 minute with sp02 at 90%.  Patient Saturations on 0 Liters of oxygen while Ambulating = sp02 95%  At end of testing pt left in room on 0  Liters of oxygen.  Reported results to nurse.   Feliciana Rossetti Mobility Specialist Please contact via Special educational needs teacher or  Rehab office at [phone]

## 2023-08-07 NOTE — Progress Notes (Signed)
 Patient asking me to notify MD she wants pain medication increased. MD aware, he walked in during this conversation with patient. After MD left room, pt refused lovenox. MD notified.

## 2023-08-07 NOTE — Consult Note (Signed)
 Palliative Care Consult Note                                  Date: 08/07/2023   Patient Name: [name] [name]  DOB: [birth year]  MRN: [id number]  Age / Sex: 53 y.o., female  PCP: [name] Referring Physician: Tyrone Nine, MD  Reason for Consultation: Establishing goals of care  HPI/Patient Profile: 53 y.o. female  with past [name] history of bipolar 1 disorder, HTN, HFrEF (LVEF 20-25% even in setting of severe MR), stage IIIb CKD, hypothyroidism, tobacco use, suspected COPD, who presented to the ED on 08/06/2023 with recurrent shortness of breath and ongoing pain. She had been referred for home hospice at recent discharge due to severe heart failure and MR, not candidate for advanced therapies, and MitraClip would not significantly improve her symptoms. She was felt to be euvolemic at discharge but still with significant symptoms. Discharged with home hospice but was never contacted and was unable to contact them to establish care.   She was admitted on 08/06/2023 with acute recurrent dyspnea, end-stage HFrEF, bipolar disorder, chronic pain syndrome, and others.  Palliative medicine was consulted for GOC conversations and assistance with hospice.  Past [name] History:  Diagnosis Date   Anxiety disorder    Asthma    Bipolar affective disorder (HCC)    Blood transfusion without reported diagnosis    CHF (congestive heart failure) (HCC)    COPD (chronic obstructive pulmonary disease) (HCC)    Depression    Diabetes mellitus    Migraine     Subjective:   This NP [name] [name] reviewed [name] records, received report from team, assessed the patient and then meet at the patient's bedside to discuss diagnosis, prognosis, GOC, EOL wishes disposition and options.  I met with the patient at the bedside, no family present.   We meet to discuss diagnosis prognosis, GOC, EOL wishes, disposition and options. Concept of Palliative Care  was introduced as specialized [name] care for people and their families living with serious illness.  If focuses on providing relief from the symptoms and stress of a serious illness.  The goal is to improve quality of life for both the patient and the family. Values and goals of care important to patient and family were attempted to be elicited.  Created space and opportunity for patient  and family to explore thoughts and feelings regarding current [name] situation   Natural trajectory and current clinical status were discussed. Questions and concerns addressed. Patient  encouraged to call with questions or concerns.    Patient/Family Understanding of Illness: Patient understands she has severe heart and lung disease that cannot be fixed.  She understands that she is likely approaching end-of-life.  She understands recommendations for hospice care at this time being that we cannot fix her, which would at least allow appropriate symptom management.  She was agreeable to hospice at last admission and is agreeable again today.  Goals: Understanding she is incurable, wants to be comfortable if end-of-life is approaching  Today's Discussion: Today saw the patient bedside, no family was present.  We discussed her current health situation and clinical details.  We talked about recommendations for home hospice, which were previously discussed and agreed upon.  She states that [facility] never called her to set up a visit.  I shared that she was out of network with [facility] and therefore they canceled the visit, however  they should have called her to notify her.  We discussed offer to refer to hospice of the [name] [name], which also was previously evaluating her.  I asked if she lives with and she states her friend [name] [name].  I told her that [name] [name] previously agreed that she could go home with hospice there if she is not on oxygen.  Based on her walk test today and physician's recommendations, she does not need  oxygen at home at this time.  The patient states that based on that she is comfortable going home without oxygen. We also discussed medication supply from hospice which would occur in 15-day increments, but I assured her that she would not run out as hospice comes out to see you typically at least 1-3 times a week.  She is agreeable to hospice medications and 15-day increments as well.  After my visit I debriefed with the hospitalist Dr. Hazeline [name], social worker, nursing, and hospice liaison.  I explained that the patient is agreeable to going home without oxygen based on [name] recommendations, hospice pain medications applied into day increments.  She is agreeable to hospice and excepts referral to hospice of the [name] [name] for evaluation.  I provided emotional and general support through therapeutic listening, empathy, sharing of stories, and other techniques. I answered all questions and addressed all concerns to the best of my ability.  Review of Systems  Constitutional:  Positive for fatigue.  Respiratory:  Negative for shortness of breath (currently, at rest).   Gastrointestinal:  Negative for abdominal pain, nausea and vomiting.    Objective:   Primary Diagnoses: Present on Admission:  Acute on chronic systolic CHF (congestive heart failure) (HCC)   Physical Exam Vitals and nursing note reviewed.  Constitutional:      General: She is not in acute distress.    Appearance: She is ill-appearing. She is not toxic-appearing.  HENT:     Head: Normocephalic and atraumatic.  Cardiovascular:     Rate and Rhythm: Normal rate.  Pulmonary:     Effort: Pulmonary effort is normal. No respiratory distress.     Breath sounds: Decreased breath sounds present. No wheezing or rhonchi.  Skin:    General: Skin is warm and dry.  Neurological:     Mental Status: She is alert and oriented to person, place, and time.  Psychiatric:        Mood and Affect: Mood normal.        Behavior: Behavior  normal.     Vital Signs:  BP 95/71 (BP Location: Right Arm)   Pulse 89   Temp 98.4 F (36.9 C) (Oral)   Resp 19   Ht 5\' 5"  (1.651 m)   Wt 70.6 kg   LMP  (LMP Unknown) Comment: Pt is poor historian  SpO2 93%   BMI 25.90 kg/m   Palliative Assessment/Data: 70%    Advanced Care Planning:   Existing Vynca/ACP Documentation: None  Primary Decision Maker: PATIENT  Code Status/Advance Care Planning: DNR-Interventions desired  A discussion was had today regarding advanced directives. Concepts specific to code status, artifical feeding and hydration, continued IV antibiotics and rehospitalization was had.  The difference between a aggressive [name] intervention path and a palliative comfort care path for this patient at this time was had.   Decisions/Changes to ACP: None today  Assessment & Plan:   Impression: 53 year old female with acute presentation of chronic comorbidities as described above.  We seem to have answered some of our lingering questions about candidacy for  hospice.  See above for details.  I have requested TOC to make a referral to hospice of the [name] [name] for evaluation.  I have included their liaison and conversations as well.  Overall prognosis poor.  SUMMARY OF RECOMMENDATIONS   DNR Continue current scope of care while admitted [name] referral for evaluation by hospice of the [name] Palliative medicine is available to assist further as needed  Symptom Management:  Per primary team PMT is available to assist as needed  Prognosis:  < 6 months  Discharge Planning:  Home with Hospice   Discussed with: Patient, [name] team, nursing team, [facility] team, hospice liaison    Thank you for allowing [place] to participate in the care of [name] [name] PMT will continue to support holistically.  Time Total: 72 min  Detailed review of [name] records (labs, imaging, vital signs), medically appropriate exam, discussed with treatment team, counseling and  education to patient, family, & staff, documenting clinical information, medication management, coordination of care  Signed by: [name] Dust, NP Palliative Medicine Team  Team Phone # [phone] (Nights/Weekends)  08/07/2023, 2:52 PM
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
## 2023-08-07 NOTE — Discharge Instructions (Signed)
 [facility] [address], [place], [address] [phone] phone   New Patient Assessment/Therapy Walk-Ins:  Monday and Wednesday: 8 am until slots are full. Every 1st and 2nd Fridays of the month: 1 pm - 5 pm.  NO ASSESSMENT/THERAPY WALK-INS ON TUESDAYS OR THURSDAYS  New Patient Assessment/Medication Management Walk-Ins:  Monday - Friday:  8 am - 11 am.  For all walk-ins, we ask that you arrive by 7:30 am because patients will be seen in the order of arrival.  Availability is limited; therefore, you may not be seen on the same day that you walk-in.  Our goal is to serve and meet the needs of our community to the best of our ability.

## 2023-08-07 NOTE — Progress Notes (Signed)
 Mobility Specialist Progress Note:    08/07/23 1000  Mobility  Activity Ambulated with assistance in hallway  Level of Assistance Contact guard assist, steadying assist  Assistive Device Front wheel walker  Distance Ambulated (ft) 150 ft  Activity Response Tolerated well  Mobility Referral Yes  Mobility visit 1 Mobility  Mobility Specialist Start Time (ACUTE ONLY) 1000  Mobility Specialist Stop Time (ACUTE ONLY) 1015  Mobility Specialist Time Calculation (min) (ACUTE ONLY) 15 min   Pt received in bed, agreeable to mobility session. Ambulated in hallway, no AD required, CGA with gait belt for safety d/t unsteadiness. Pt would hold on to hand rails for additional support. SpO2 95% on RA at rest and during ambulation. Pt took 3 standing rest breaks throughout session and c/o SOB, during breaks, SpO2 90% on RA. Pt would recover to SpO2 95% on RA when ambulating again. On return to room, SpO2 90% on RA, reported dizziness. Pt lying comfortably in bed, SpO2 95% on RA. RN notified. Left with all needs met. Call bell in reach.   Feliciana Rossetti Mobility Specialist Please contact via Special educational needs teacher or  Rehab office at [phone]

## 2023-08-07 NOTE — Progress Notes (Signed)
 [facility] PROGRESS NOTE  [name] [name] (DOB: 13-Oct-1970) WUJ:[id number] PCP: [facility]  Brief Narrative: HALLA CHOPP is a 53 y.o. female with a history of bipolar 1 disorder, HTN, HFrEF (LVEF 20-25% even in setting of severe MR), stage IIIb CKD, hypothyroidism, tobacco use, suspected COPD, who presented to the ED on 08/06/2023 with recurrent shortness of breath and ongoing pain. She had been referred for home hospice at recent discharge due to severe heart failure and MR, not candidate for advanced therapies, and MitraClip would not significantly improve her symptoms. Her poor prognosis was discussed amongst multiple cardiologists, heart failure team, and hospitalist team during that admission. She was felt to be euvolemic at discharge but still with significant symptoms. Discharged with home hospice but was never contacted and was unable to contact them to establish care. She has been admitted but again appears to be optimized from volume standpoint, without ongoing AECOPD, and with no evidence of infection. No evidence of ACS at admission.  Subjective: Patient requesting severe pain in her chest that is chronic and treated with percocet, not worse with exertion. She's requesting IV dilaudid. Feels tired. Her shortness of breath is improving in that episodes of dyspnea are less thus far. Her oxygen saturations actually improve with exertion.   Objective: BP 95/71 (BP Location: Right Arm)   Pulse 89   Temp 98.4 F (36.9 C) (Oral)   Resp 19   Ht 5\' 5"  (1.651 m)   Wt 70.6 kg   LMP  (LMP Unknown) Comment: Pt is poor historian  SpO2 93%   BMI 25.90 kg/m   Gen: Anxious-appearing female in no acute distress Pulm: Nonlabored on room air, poor pleth on monitor, inspiratory crackles at R > L bases that clear with repeated inspiratory efforts.   CV: RRR, soft systolic apical murmur stable, no rub or gallop, trace LE edema, no JVD GI: Soft, NT, ND, +BS Neuro: Alert and  oriented. No new focal deficits. Ext: Warm, no deformities Skin: No rashes, lesions or ulcers on visualized skin   Assessment & Plan: Acute, recurrent dyspnea: Primary etiology appears to be anxiety superimposed on poor cardiopulmonary reserve due to CHF and COPD. She is not hypoxemic and has reassuring work up this admission. She has some bibasilar crackles though this improved with repeated inspiration, consistent with atelectasis. CXR confirmed no significant pulmonary edema. Her oxygen saturations are reassuring, however I'm concerned for narcosis/hypoventilation with over sedation so will not offer IV dilaudid at this time. Her pressures are also soft, limiting our ability to administer sedating medications as inpatient. Continue percocet.  - Continue supportive care.   End-stage HFrEF, severe MR: Hospice-appropriate.  - Palliative care consulted for additional support. CM also involved in ensuring hospice follow up at discharge.  - Continue home medications including midodrine, bumex, SGL2i. Recheck BMP in AM to confirm stability, otherwise would need to hold diuretic.   Bipolar disorder, schizoaffective disorder, anxiety, depression: Also reported to have psychosocial stressors including previous homelessness.  - Psychiatry consult appreciated. Will start abilify.  - Would consider prn for anxiety, though runs risk of respiratory depression. Suspect an element of her seeking IV dilaudid is borne from anxiety which improves with administration.   Chronic pain syndrome:  - Continue percocet PTA dosing, as discussed above risk of IV dilaudid outweighs potential benefit.  - Given her report of chest pain, though quite atypical, cardiac history, and demand myocardial ischemia, will recheck troponin and ECG.   IDT2DM:  - Continue basal  insulin and SSI. Very labile glycemic trends continue.   Hypokalemia:  - Supplement today  Stage IIIb CKD:  - Monitor in AM, avoid nephrotoxins  HLD:  -  Continue statin, though once confirmed to be under home hospice care, would likely be able to DC this.   DNR, POA, GOC counseling:  - Multidisciplinary discussions (medicine, psychiatry, palliative care, CSW/CM) ongoing throughout today. Will continue counseling patient and pursuing home hospice coverage.   Tyrone Nine, MD [facility] [email or web address] 08/07/2023, 12:56 PM

## 2023-08-07 NOTE — Progress Notes (Signed)
 Patient oxygen saturation 85% on RA. 1L [place] applied.  After application oxygen saturation 95% on 1L [place]. Patient states she uses up to 3L  PRN at home.

## 2023-08-08 DIAGNOSIS — R7989 Other specified abnormal findings of blood chemistry: Secondary | ICD-10-CM

## 2023-08-08 DIAGNOSIS — F319 Bipolar disorder, unspecified: Secondary | ICD-10-CM

## 2023-08-08 DIAGNOSIS — I509 Heart failure, unspecified: Secondary | ICD-10-CM

## 2023-08-08 DIAGNOSIS — I5023 Acute on chronic systolic (congestive) heart failure: Secondary | ICD-10-CM | POA: Diagnosis not present

## 2023-08-08 LAB — GLUCOSE, CAPILLARY
Glucose-Capillary: 284 mg/dL — ABNORMAL HIGH (ref 70–99)
Glucose-Capillary: 260 mg/dL — ABNORMAL HIGH (ref 70–99)
Glucose-Capillary: 122 mg/dL — ABNORMAL HIGH (ref 70–99)
Glucose-Capillary: 164 mg/dL — ABNORMAL HIGH (ref 70–99)

## 2023-08-08 MED ORDER — INSULIN ASPART 100 UNIT/ML IJ SOLN
3.0000 [IU] | Freq: Three times a day (TID) | INTRAMUSCULAR | Status: DC
  Administered 2023-08-09 – 2023-08-11 (×7): 3 [IU] via SUBCUTANEOUS

## 2023-08-08 MED ORDER — PROCHLORPERAZINE EDISYLATE 10 MG/2ML IJ SOLN
10.0000 mg | Freq: Four times a day (QID) | INTRAMUSCULAR | Status: DC | PRN
  Administered 2023-08-11: 10 mg via INTRAVENOUS
  Filled 2023-08-08 (×2): qty 2

## 2023-08-08 NOTE — Progress Notes (Signed)
 Brief Palliative Medicine Progress Note:  PMT following peripherally for needs/decline:  Medical records reviewed including progress notes, labs, imaging. No acute changes.  Goals are clear to treat the treatable while admitted and discharge home with hospice - [facility] consult previously placed. PMT will continue to follow peripherally. If there are any imminent needs please call the service directly. Family also has PMT contact information should further needs arise.  Thank you for allowing PMT to assist in the care of this patient.  [name] [name] M. Katrinka Blazing [facility] Palliative Medicine Team Team Phone: [phone] NO CHARGE

## 2023-08-08 NOTE — Progress Notes (Signed)
 [facility] PROGRESS NOTE  [name] [name] (DOB: 1970-12-31) UEA:[id number] PCP: [facility]  Brief Narrative: [name] [name] is a 53 y.o. female with a history of bipolar 1 disorder, HTN, HFrEF (LVEF 20-25% even in setting of severe MR), stage IIIb CKD, hypothyroidism, tobacco use, suspected COPD, who presented to the ED on 08/06/2023 with recurrent shortness of breath and ongoing pain. She had been referred for home hospice at recent discharge due to severe heart failure and MR, not candidate for advanced therapies, and MitraClip would not significantly improve her symptoms. Her poor prognosis was discussed amongst multiple cardiologists, heart failure team, and hospitalist team during that admission. She was felt to be euvolemic at discharge but still with significant symptoms. Discharged with home hospice but was never contacted and was unable to contact them to establish care. She has been admitted but again appears to be optimized from volume standpoint, without ongoing AECOPD, and with no evidence of infection. No evidence of ACS at admission.  Subjective: No major change. She states she doesn't know why she's having these symptoms, but the volunteers possible reasons that a correct and complete. She doesn't want to live like this. At time of interview, she has no dyspnea and nausea that has resolved after compazine.   Objective: BP 94/74 (BP Location: Left Arm)   Pulse 99   Temp 98.4 F (36.9 C) (Oral)   Resp 20   Ht 5\' 5"  (1.651 m)   Wt 68.1 kg   LMP  (LMP Unknown) Comment: Pt is poor historian  SpO2 100%   BMI 24.98 kg/m   Gen: Anxious patient in no distress Pulm: Clear, nonlabored, mildly tachypneic  CV: RRR, no MRG  GI: Soft, NT, ND, +BS  Neuro: Alert and oriented. No new focal deficits. Ext: Warm, no deformities Skin: No rashes, lesions or ulcers on visualized skin   Assessment & Plan: Acute, recurrent dyspnea: Primary etiology appears to be anxiety  superimposed on poor cardiopulmonary reserve due to CHF and COPD. She is not hypoxemic and has reassuring work up this admission. She has some bibasilar crackles though this improved with repeated inspiration, consistent with atelectasis. CXR confirmed no significant pulmonary edema. Her oxygen saturations are reassuring, however I'm concerned for narcosis/hypoventilation with over sedation so will not offer IV dilaudid at this time. Her pressures are also soft, limiting our ability to administer sedating medications as inpatient.  - Continue percocet.  - Continue supportive care.   End-stage HFrEF, severe MR: Hospice-appropriate.  - Palliative care consulted for additional support. CM also involved in ensuring hospice follow up at discharge. This is the only barrier to her discharge at this time.  - Continue home medications including midodrine, bumex, SGL2i.   Bipolar disorder, schizoaffective disorder, anxiety, depression: Also reported to have psychosocial stressors including previous homelessness.  - Psychiatry consult appreciated. Will start abilify.  - Would consider prn for anxiety, though runs risk of respiratory depression. Suspect an element of her seeking IV dilaudid is borne from anxiety which improves with administration.   Chronic pain syndrome:  - Continue percocet PTA dosing, as discussed above risk of IV dilaudid outweighs potential benefit.  - Given her report of chest pain, though quite atypical, cardiac history, and demand myocardial ischemia, will recheck troponin and ECG.   IDT2DM:  - Continue basal insulin and SSI. Very labile glycemic trends continue, but overall elevated. While she's here will add mealtime insulin..   Hypokalemia:  - Supplemented  Stage IIIb CKD:  -  Monitor in AM, avoid nephrotoxins  HLD:  - Continue statin, though once confirmed to be under home hospice care, would likely be able to DC this.   DNR, POA, GOC counseling:  - Multidisciplinary  discussions (medicine, psychiatry, palliative care, CSW/CM) ongoing throughout today. Will continue counseling patient and pursuing home hospice coverage.   Tyrone Nine, MD [facility] [email or web address] 08/08/2023, 3:54 PM

## 2023-08-08 NOTE — TOC Initial Note (Signed)
 Transition of Care (TOC) - Initial/Assessment Note  Donn Pierini RN, BSN Transitions of Care Unit 4E- RN Case Manager See Treatment Team for direct phone #   Patient Details  Name: [name] [name] MRN: [id number] Date of Birth: 07/23/1970  Transition of Care [facility]) CM/SW Contact:    Darrold Span, RN Phone Number: 08/08/2023, 4:15 PM  Clinical Narrative:                 Per PC plan is for pt to return to friend Laurie's apartment with Hospice if agency can be found to service.  CM has heard back from [facility] [place] liaison [place]) who confirmed that they are unable to accept referral due to multiple social barriers.  -Cm reached back out to [facility] to have them re-look at pt now that it has been confirmed that pt also has [employer] as primary insurance. -[facility] liaison Dahlia Client) has confirmed that they remain OON and unable to service pt for Hospice needs.  -CM has reached out to [facility]- to ask her to review. Referral pending.   Expected Discharge Plan: Home w Hospice Care Barriers to Discharge: Other (must enter comment) (trying to find Hospice agency to accept)   Patient Goals and CMS Choice Patient states their goals for this hospitalization and ongoing recovery are:: return home w hospice   Choice offered to / list presented to : Patient      Expected Discharge Plan and Services   Discharge Planning Services: CM Consult Post Acute Care Choice: Hospice Living arrangements for the past 2 months: Apartment                           HH Arranged: RN   Date HH Agency Contacted: 08/08/23      Prior Living Arrangements/Services Living arrangements for the past 2 months: Apartment Lives with:: Friends Patient language and need for interpreter reviewed:: Yes Do you feel safe going back to the place where you live?: Yes      Need for Family Participation in Patient Care: Yes (Comment) Care giver support system in place?: Yes  (comment)   Criminal Activity/Legal Involvement Pertinent to Current Situation/Hospitalization: No - Comment as needed  Activities of Daily Living   ADL Screening (condition at time of admission) Independently performs ADLs?: Yes (appropriate for developmental age) Is the patient deaf or have difficulty hearing?: No Does the patient have difficulty seeing, even when wearing glasses/contacts?: Yes Does the patient have difficulty concentrating, remembering, or making decisions?: Yes  Permission Sought/Granted Permission sought to share information with : Oceanographer granted to share information with : Yes, Verbal Permission Granted              Emotional Assessment Appearance:: Appears stated age Attitude/Demeanor/Rapport: Engaged Affect (typically observed): Appropriate Orientation: : Oriented to Self, Oriented to Place, Oriented to  Time, Oriented to Situation Alcohol / Substance Use: Not Applicable Psych Involvement: No (comment)  Admission diagnosis:  Elevated troponin [R79.89] Acute on chronic systolic CHF (congestive heart failure) (HCC) [I50.23] Chest pain, unspecified type [R07.9] Acute on chronic congestive heart failure, unspecified heart failure type [facility]) [I50.9] Patient Active Problem List   Diagnosis Date Noted   Goals of care, counseling/discussion 07/23/2023   Dyspnea 07/21/2023   GAD (generalized anxiety disorder) 07/18/2023   Acute on chronic combined systolic (congestive) and diastolic (congestive) heart failure (HCC) 07/18/2023   Bipolar 1 disorder (HCC) 07/06/2023   Acute  exacerbation of CHF (congestive heart failure) (HCC) 07/05/2023   Chronic pain syndrome 05/25/2023   Heart failure (HCC) 05/23/2023   CHF (congestive heart failure) (HCC) 05/22/2023   Hypomagnesemia 05/22/2023   Hypotension 02/14/2023   Prolonged QT interval 09/17/2022   Severe mitral regurgitation 09/17/2022   CKD stage 3a, GFR 45-59 ml/min (HCC)  09/17/2022   Hyperglycemic crisis due to diabetes mellitus (HCC) 08/30/2022   Hyperthyroidism 08/29/2022   COPD (chronic obstructive pulmonary disease) (HCC) 08/04/2022   Chronic systolic CHF (congestive heart failure) (HCC) 08/03/2022   Acute on chronic systolic CHF (congestive heart failure) (HCC) 05/15/2022   Overdose, intentional self-harm, initial encounter (HCC) 12/13/2018   DM2 (diabetes mellitus, type 2) (HCC) 01/03/2018   HTN (hypertension) 08/28/2015   PCP:  [facility] Pharmacy:   [id number] [place], [place] - [address] AT [facility] OF [facility] & [address] [place] [address] Phone: [phone] Fax: [phone]  [facility] [address] [place] [address] Phone: [phone] Fax: [phone]     Social Drivers of Health (SDOH) Social History: SDOH Screenings   Food Insecurity: No Food Insecurity (08/06/2023)  Recent Concern: Food Insecurity - Food Insecurity Present (07/06/2023)  Housing: High Risk (08/06/2023)  Transportation Needs: No Transportation Needs (08/06/2023)  Utilities: Not At Risk (08/06/2023)  Alcohol Screen: Low Risk  (02/12/2023)  Depression (PHQ2-9): High Risk (09/30/2022)  Financial Resource Strain: High Risk (09/30/2022)  Social Connections: Moderately Isolated (08/06/2023)  Stress: No Stress Concern Present (01/07/2022)   [facility] from [facility]  Tobacco Use: High Risk (08/06/2023)   SDOH Interventions:     Readmission Risk Interventions    07/23/2023   12:02 PM 07/10/2023    1:21 PM 02/12/2023    5:00 PM  Readmission Risk Prevention Plan  Transportation Screening Complete Complete Complete  Medication Review Oceanographer) Complete Complete   PCP or Specialist appointment within 3-5 days of discharge Complete Complete   HRI or Home Care Consult Complete Complete   SW Recovery Care/Counseling Consult Complete Patient  refused   Palliative Care Screening Not Applicable Not Applicable   Skilled Nursing Facility Not Applicable Not Applicable

## 2023-08-08 NOTE — Progress Notes (Signed)
 Mobility Specialist Progress Note:    08/08/23 1005  Mobility  Activity Moved into chair position in bed  Level of Assistance Independent  Assistive Device None  Activity Response Tolerated well  Mobility Referral Yes  Mobility visit 1 Mobility  Mobility Specialist Start Time (ACUTE ONLY) 1001  Mobility Specialist Stop Time (ACUTE ONLY) 1005  Mobility Specialist Time Calculation (min) (ACUTE ONLY) 4 min   Pt declined ambulation and transferring to chair after two attempts and max encouragement this morning. Pt able to move into chair position in bed to sit up straighter. Educated pt on benefits of mobility for improved breathing. Left pt in bed with all needs met, call bell in reach. HR 108 bpm at rest.   Feliciana Rossetti Mobility Specialist Please contact via SecureChat or  Rehab office at [phone]

## 2023-08-08 NOTE — Plan of Care (Signed)
   Problem: Education: Goal: Ability to describe self-care measures that may prevent or decrease complications (Diabetes Survival Skills Education) will improve Outcome: Progressing   Problem: Coping: Goal: Ability to adjust to condition or change in health will improve Outcome: Progressing   Problem: Fluid Volume: Goal: Ability to maintain a balanced intake and output will improve Outcome: Progressing

## 2023-08-08 NOTE — Progress Notes (Signed)
 Patient Diaphoric CBG done 205 Vital signs stable

## 2023-08-09 DIAGNOSIS — F319 Bipolar disorder, unspecified: Secondary | ICD-10-CM | POA: Diagnosis not present

## 2023-08-09 DIAGNOSIS — I5023 Acute on chronic systolic (congestive) heart failure: Secondary | ICD-10-CM | POA: Diagnosis not present

## 2023-08-09 LAB — GLUCOSE, CAPILLARY
Glucose-Capillary: 101 mg/dL — ABNORMAL HIGH (ref 70–99)
Glucose-Capillary: 204 mg/dL — ABNORMAL HIGH (ref 70–99)
Glucose-Capillary: 214 mg/dL — ABNORMAL HIGH (ref 70–99)
Glucose-Capillary: 205 mg/dL — ABNORMAL HIGH (ref 70–99)

## 2023-08-09 MED ORDER — FUROSEMIDE 10 MG/ML IJ SOLN
20.0000 mg | Freq: Once | INTRAMUSCULAR | Status: AC
  Administered 2023-08-09: 20 mg via INTRAVENOUS
  Filled 2023-08-09: qty 2

## 2023-08-09 NOTE — Progress Notes (Signed)
 Pt continues to refuse lovenox injection. States she does not need and it hurts. Pt resting with call bell within reach.  Will continue to monitor.

## 2023-08-09 NOTE — Plan of Care (Signed)
   Problem: Education: Goal: Ability to describe self-care measures that may prevent or decrease complications (Diabetes Survival Skills Education) will improve Outcome: Progressing   Problem: Coping: Goal: Ability to adjust to condition or change in health will improve Outcome: Progressing   Problem: Fluid Volume: Goal: Ability to maintain a balanced intake and output will improve Outcome: Progressing

## 2023-08-09 NOTE — Plan of Care (Signed)
 Brief Palliative Medicine Progress Note:   PMT following peripherally for needs/decline:   Medical records reviewed including progress notes, labs, imaging. No acute changes. Patient has been medically optimized and is ready for discharge; however, barrier is finding a hospice agency that will accept patient - appreciate TOC assistance with disposition.   PMT will follow peripherally and visit with patient and family incrementally for goals of care discussions as appropriate and based on clinical course. If there are any imminent needs please call the service directly. Family also has PMT contact information should further needs arise.   Thank you for allowing PMT to assist in the care of this patient.   Thatiana Renbarger M. Katrinka Blazing [facility] Palliative Medicine Team Team Phone: [phone] NO CHARGE

## 2023-08-09 NOTE — Progress Notes (Addendum)
 [facility] PROGRESS NOTE  [name] [name] (DOB: Oct 26, 1970) ZOX:[id number] [facility]CP: [facility]  Brief Narrative: [name] [name] is a 53 y.o. female with a history of bipolar 1 disorder, HTN, HFrEF (LVEF 20-25% even in setting of severe MR), stage IIIb CKD, hypothyroidism, tobacco use, suspected COPD, who presented to the ED on 08/06/2023 with recurrent shortness of breath and ongoing pain. She had been referred for home hospice at recent discharge due to severe heart failure and MR, not candidate for advanced therapies, and MitraClip would not significantly improve her symptoms. Her poor prognosis was discussed amongst multiple cardiologists, heart failure team, and hospitalist team during that admission. She was felt to be euvolemic at discharge but still with significant symptoms. Discharged with home hospice but was never contacted and was unable to contact them to establish care. She has been admitted but again appears to be optimized from volume standpoint, without ongoing AECOPD, and with no evidence of infection. No evidence of ACS at admission.  Subjective: Slightly worse orthopnea but otherwise no complaints. Ambulated today, tolerated well, SpO2 97-99% on room air.  Objective: BP (!) 122/95 (BP Location: Right Arm)   Pulse 96   Temp 99.6 F (37.6 C) (Oral)   Resp 20   Ht 5\' 5"  (1.651 m)   Wt 68.5 kg   LMP  (LMP Unknown) Comment: Pt is poor historian  SpO2 97%   BMI 25.13 kg/m   Gen: No distress Pulm: Crackles at bases, nonlabored on room air  CV: RRR, no MRG GI: Soft, NT, ND, +BS  Neuro: Alert and oriented. No new focal deficits. Ext: Warm, no deformities Skin: No new rashes, lesions or ulcers on visualized skin    Assessment & Plan: Acute, recurrent dyspnea: Primary etiology appears to be anxiety superimposed on poor cardiopulmonary reserve due to CHF and COPD. She is not hypoxemic and has reassuring work up this admission. She has some bibasilar crackles  though this improved with repeated inspiration, consistent with atelectasis. CXR confirmed no significant pulmonary edema. Her oxygen saturations are reassuring, however I'm concerned for narcosis/hypoventilation with over sedation so will not offer IV dilaudid at this time. Her pressures are also soft, limiting our ability to administer sedating medications as inpatient.  - Continue percocet.  - Continue supportive care.   End-stage HFrEF, severe MR: Hospice-appropriate.  - Palliative care consulted for additional support. CM also involved in ensuring hospice follow up at discharge. She is medically optimized, having maximized benefit of hospitalization. This is the only barrier to her discharge at this time.  - Continue home medications including midodrine, SGLT2i, may restart bumex in AM, but give IV lasix given increasing orthopnea today. .   Bipolar disorder, schizoaffective disorder, anxiety, depression: Also reported to have psychosocial stressors including previous homelessness.  - Psychiatry consult appreciated. Will start abilify.  - Would consider prn for anxiety, though runs risk of respiratory depression. Suspect an element of her seeking IV dilaudid is borne from anxiety which improves with administration.   Chronic pain syndrome: ECG, troponin trend ruled out for ACS. - Continue percocet PTA dosing, as discussed above risk of IV dilaudid outweighs potential benefit.    IDT2DM:  - Continue basal insulin and SSI. Very labile glycemic trends continue, but overall elevated. While she's here will add mealtime insulin..   Hypokalemia:  - Supplemented, recheck in AM.  Stage IIIb CKD:  - Monitor in AM, avoid nephrotoxins  HLD:  - Continue statin, though once confirmed to be under home  hospice care, would likely be able to DC this.   DNR, POA, GOC counseling:  - Multidisciplinary discussions (medicine, psychiatry, palliative care, CSW/CM) ongoing throughout today. Will continue  counseling patient and pursuing home hospice coverage.   Tyrone Nine, MD [facility] [email or web address] 08/09/2023, 1:15 PM

## 2023-08-09 NOTE — Progress Notes (Signed)
 Mobility Specialist Progress Note:    08/09/23 1145  Mobility  Activity Ambulated with assistance in hallway  Level of Assistance Contact guard assist, steadying assist  Assistive Device Other (Comment) (handrails and gait belt)  Distance Ambulated (ft) 100 ft  Activity Response Tolerated well  Mobility Referral Yes  Mobility visit 1 Mobility  Mobility Specialist Start Time (ACUTE ONLY) 1145  Mobility Specialist Stop Time (ACUTE ONLY) 1155  Mobility Specialist Time Calculation (min) (ACUTE ONLY) 10 min   Pt received in bed, agreeable to mobility session. Ambulated in hallway with CGA and ModI using hand rails and gait belt for support. Labored breathing during session, SpO2 97-99% on RA. Tolerated well, c/o difficulty breathing. Returned pt to room, RN at bedside, all needs met.   Feliciana Rossetti Mobility Specialist Please contact via Special educational needs teacher or  Rehab office at [phone]

## 2023-08-10 DIAGNOSIS — F319 Bipolar disorder, unspecified: Secondary | ICD-10-CM | POA: Diagnosis not present

## 2023-08-10 DIAGNOSIS — I5023 Acute on chronic systolic (congestive) heart failure: Secondary | ICD-10-CM | POA: Diagnosis not present

## 2023-08-10 LAB — BASIC METABOLIC PANEL
Anion gap: 12 (ref 5–15)
BUN: 41 mg/dL — ABNORMAL HIGH (ref 6–20)
CO2: 27 mmol/L (ref 22–32)
Calcium: 8.5 mg/dL — ABNORMAL LOW (ref 8.9–10.3)
Chloride: 95 mmol/L — ABNORMAL LOW (ref 98–111)
Creatinine, Ser: 1.34 mg/dL — ABNORMAL HIGH (ref 0.44–1.00)
GFR, Estimated: 47 mL/min — ABNORMAL LOW (ref >60)
Glucose, Bld: 271 mg/dL — ABNORMAL HIGH (ref 70–99)
Potassium: 4.1 mmol/L (ref 3.5–5.1)
Sodium: 134 mmol/L — ABNORMAL LOW (ref 135–145)

## 2023-08-10 LAB — GLUCOSE, CAPILLARY
Glucose-Capillary: 241 mg/dL — ABNORMAL HIGH (ref 70–99)
Glucose-Capillary: 239 mg/dL — ABNORMAL HIGH (ref 70–99)
Glucose-Capillary: 180 mg/dL — ABNORMAL HIGH (ref 70–99)
Glucose-Capillary: 135 mg/dL — ABNORMAL HIGH (ref 70–99)

## 2023-08-10 MED ORDER — GLUCERNA SHAKE PO LIQD
237.0000 mL | Freq: Three times a day (TID) | ORAL | Status: DC
  Administered 2023-08-10 – 2023-08-11 (×5): 237 mL via ORAL
  Filled 2023-08-10: qty 237

## 2023-08-10 MED ORDER — FUROSEMIDE 10 MG/ML IJ SOLN
40.0000 mg | Freq: Every day | INTRAMUSCULAR | Status: DC
  Administered 2023-08-10: 40 mg via INTRAVENOUS
  Filled 2023-08-10: qty 4

## 2023-08-10 NOTE — TOC Progression Note (Signed)
 Transition of Care [facility]) - Progression Note    Patient Details  Name: [name] [name] MRN: [id number] Date of Birth: June 22, 1970  Transition of Care [facility]) CM/SW Contact  Ronny Bacon, RN Phone Number: 08/10/2023, 4:46 PM  Clinical Narrative:   Call from [place] hospice Rep, unable to run patient insurance over the weekend will run it tomorrow.    Expected Discharge Plan: Home w Hospice Care Barriers to Discharge: Other (must enter comment) (trying to find Hospice agency to accept)  Expected Discharge Plan and Services   Discharge Planning Services: CM Consult Post Acute Care Choice: Hospice Living arrangements for the past 2 months: Apartment                           HH Arranged: RN   Date HH Agency Contacted: 08/08/23       Social Determinants of Health (SDOH) Interventions SDOH Screenings   Food Insecurity: No Food Insecurity (08/06/2023)  Recent Concern: Food Insecurity - Food Insecurity Present (07/06/2023)  Housing: High Risk (08/06/2023)  Transportation Needs: No Transportation Needs (08/06/2023)  Utilities: Not At Risk (08/06/2023)  Alcohol Screen: Low Risk  (02/12/2023)  Depression (PHQ2-9): High Risk (09/30/2022)  Financial Resource Strain: High Risk (09/30/2022)  Social Connections: Moderately Isolated (08/06/2023)  Stress: No Stress Concern Present (01/07/2022)   Received from [facility]  Tobacco Use: High Risk (08/06/2023)    Readmission Risk Interventions    07/23/2023   12:02 PM 07/10/2023    1:21 PM 02/12/2023    5:00 PM  Readmission Risk Prevention Plan  Transportation Screening Complete Complete Complete  Medication Review Oceanographer) Complete Complete   PCP or Specialist appointment within 3-5 days of discharge Complete Complete   HRI or Home Care Consult Complete Complete   SW Recovery Care/Counseling Consult Complete Patient refused   Palliative Care Screening Not Applicable Not Applicable   Skilled Nursing Facility Not  Applicable Not Applicable

## 2023-08-10 NOTE — Progress Notes (Signed)
 [facility] PROGRESS NOTE  [name] [name] (DOB: 04/10/1971) ZOX:[id number] PCP: [facility]  Brief Narrative: AIVY AKTER is a 53 y.o. female with a history of bipolar 1 disorder, HTN, HFrEF (LVEF 20-25% even in setting of severe MR), stage IIIb CKD, hypothyroidism, tobacco use, suspected COPD, who presented to the ED on 08/06/2023 with recurrent shortness of breath and ongoing pain. She had been referred for home hospice at recent discharge due to severe heart failure and MR, not candidate for advanced therapies, and MitraClip would not significantly improve her symptoms. Her poor prognosis was discussed amongst multiple cardiologists, heart failure team, and hospitalist team during that admission. She was felt to be euvolemic at discharge but still with significant symptoms. Discharged with home hospice but was never contacted and was unable to contact them to establish care. She has been admitted but again appears to be optimized from volume standpoint, without ongoing AECOPD, and with no evidence of infection. No evidence of ACS at admission.  Subjective: No major changes, did not have significant UOP with lasix 20 yesterday.   Objective: BP 101/73 (BP Location: Right Arm)   Pulse 87   Temp 99.4 F (37.4 C) (Oral)   Resp 14   Ht 5\' 5"  (1.651 m)   Wt 71.7 kg   LMP  (LMP Unknown) Comment: Pt is poor historian  SpO2 94%   BMI 26.31 kg/m   Gen: No distress Pulm: Nonlabored but still with bibasilar crackles. No wheezes.  CV: RRR,  GI: Soft, NT, ND, +BS  Neuro: Alert and oriented. No new focal deficits. Ext: Warm, no deformities Skin: No new rashes, lesions or ulcers on visualized skin    Assessment & Plan: Acute, recurrent dyspnea: Primary etiology appears to be anxiety superimposed on poor cardiopulmonary reserve due to CHF and COPD. She is not hypoxemic and has reassuring work up this admission. She has some bibasilar crackles though this improved with repeated  inspiration, consistent with atelectasis. CXR confirmed no significant pulmonary edema. Her oxygen saturations are reassuring, however I'm concerned for narcosis/hypoventilation with over sedation so will not offer IV dilaudid at this time. Her pressures are also soft, limiting our ability to administer sedating medications as inpatient.  - Continue percocet.  - Continue supportive care.  - Continue diuresis, augment IV lasix 40mg .   End-stage HFrEF, severe MR: Hospice-appropriate.  - Palliative care consulted for additional support. CM also involved in ensuring hospice follow up at discharge. She is medically optimized, having maximized benefit of hospitalization. This is the only barrier to her discharge at this time.  - Continue home medications including midodrine, SGLT2i, may restart bumex in AM, but give IV lasix given increasing orthopnea today. .   Bipolar disorder, schizoaffective disorder, anxiety, depression: Also reported to have psychosocial stressors including previous homelessness.  - Psychiatry consult appreciated. Will start abilify.  - Would consider prn for anxiety, though runs risk of respiratory depression. Suspect an element of her seeking IV dilaudid is borne from anxiety which improves with administration.   Chronic pain syndrome: ECG, troponin trend ruled out for ACS. - Continue percocet PTA dosing, as discussed above risk of IV dilaudid outweighs potential benefit.    IDT2DM:  - Continue basal insulin and SSI. Very labile glycemic trends continue, but overall elevated. While she's here will add mealtime insulin..   Hypokalemia:  - Supplemented, recheck in AM.  Stage IIIb CKD:  - Monitor in AM, avoid nephrotoxins  HLD:  - Continue statin, though once confirmed  to be under home hospice care, would likely be able to DC this.   DNR, POA, GOC counseling:  - Multidisciplinary discussions (medicine, psychiatry, palliative care, CSW/CM) ongoing throughout today. Will  continue counseling patient and pursuing home hospice coverage.   Tyrone Nine, MD [facility] [email or web address] 08/10/2023, 9:48 AM

## 2023-08-10 NOTE — Progress Notes (Signed)
 Mobility Specialist Progress Note:    08/10/23 1018  Mobility  Activity Ambulated with assistance in hallway;Ambulated with assistance in room  Level of Assistance Contact guard assist, steadying assist  Distance Ambulated (ft) 240 ft  Activity Response Tolerated well  Mobility Referral Yes  Mobility visit 1 Mobility  Mobility Specialist Start Time (ACUTE ONLY) 1011  Mobility Specialist Stop Time (ACUTE ONLY) 1018  Mobility Specialist Time Calculation (min) (ACUTE ONLY) 7 min   Pt received in bed, min encouragement required to participate in mobility session. Ambulated in hallway, no AD required, CGA with gait belt for safety. Tolerated well, SpO2 92-93% on RA. Took 3 standing rest breaks d/t pt feeling SOB. Returned pt to room, left with all needs met.   Feliciana Rossetti Mobility Specialist Please contact via Special educational needs teacher or  Rehab office at [phone]

## 2023-08-10 NOTE — Plan of Care (Signed)
   Problem: Coping: Goal: Ability to adjust to condition or change in health will improve Outcome: Progressing   Problem: Fluid Volume: Goal: Ability to maintain a balanced intake and output will improve Outcome: Progressing

## 2023-08-11 ENCOUNTER — Other Ambulatory Visit (HOSPITAL_COMMUNITY): Payer: Self-pay

## 2023-08-11 DIAGNOSIS — I5023 Acute on chronic systolic (congestive) heart failure: Secondary | ICD-10-CM | POA: Diagnosis not present

## 2023-08-11 LAB — BASIC METABOLIC PANEL
Anion gap: 8 (ref 5–15)
BUN: 32 mg/dL — ABNORMAL HIGH (ref 6–20)
CO2: 29 mmol/L (ref 22–32)
Calcium: 8.2 mg/dL — ABNORMAL LOW (ref 8.9–10.3)
Chloride: 97 mmol/L — ABNORMAL LOW (ref 98–111)
Creatinine, Ser: 1.39 mg/dL — ABNORMAL HIGH (ref 0.44–1.00)
GFR, Estimated: 45 mL/min — ABNORMAL LOW (ref >60)
Glucose, Bld: 284 mg/dL — ABNORMAL HIGH (ref 70–99)
Potassium: 4.1 mmol/L (ref 3.5–5.1)
Sodium: 134 mmol/L — ABNORMAL LOW (ref 135–145)

## 2023-08-11 LAB — GLUCOSE, CAPILLARY
Glucose-Capillary: 275 mg/dL — ABNORMAL HIGH (ref 70–99)
Glucose-Capillary: 303 mg/dL — ABNORMAL HIGH (ref 70–99)

## 2023-08-11 LAB — MAGNESIUM: Magnesium: 2.1 mg/dL (ref 1.7–2.4)

## 2023-08-11 MED ORDER — FUROSEMIDE 10 MG/ML IJ SOLN
40.0000 mg | Freq: Once | INTRAMUSCULAR | Status: AC
  Administered 2023-08-11: 40 mg via INTRAVENOUS
  Filled 2023-08-11: qty 4

## 2023-08-11 MED ORDER — ARIPIPRAZOLE 5 MG PO TABS
5.0000 mg | ORAL_TABLET | Freq: Every day | ORAL | 0 refills | Status: DC
  Filled 2023-08-11: qty 30, 30d supply, fill #0

## 2023-08-11 MED ORDER — BUMETANIDE 1 MG PO TABS
1.0000 mg | ORAL_TABLET | Freq: Every day | ORAL | Status: DC
  Administered 2023-08-11: 1 mg via ORAL
  Filled 2023-08-11: qty 1

## 2023-08-11 MED ORDER — OXYCODONE-ACETAMINOPHEN 10-325 MG PO TABS
1.0000 | ORAL_TABLET | Freq: Four times a day (QID) | ORAL | 0 refills | Status: DC | PRN
  Filled 2023-08-11: qty 8, 2d supply, fill #0

## 2023-08-11 MED ORDER — HYDROXYZINE HCL 25 MG PO TABS
50.0000 mg | ORAL_TABLET | Freq: Three times a day (TID) | ORAL | 0 refills | Status: DC | PRN
  Filled 2023-08-11: qty 20, 4d supply, fill #0

## 2023-08-11 NOTE — Discharge Summary (Signed)
 Physician Discharge Summary   Patient: [name] [name] MRN: [id number] DOB: 26-Mar-1971  Admit date:     08/06/2023  Discharge date: 08/11/23  Discharge Physician: Tyrone Nine   [facility]: [facility]   Recommendations at discharge:  Continue care with home hospice through [facility].   Discharge Diagnoses: Principal Problem:   Acute on chronic systolic CHF (congestive heart failure) [facility])  Hospital Course: [name] [name] is a 53 y.o. female with a history of bipolar 1 disorder, HTN, HFrEF (LVEF 20-25% even in setting of severe MR), stage IIIb CKD, hypothyroidism, tobacco use, suspected COPD, who presented to the ED on 08/06/2023 with recurrent shortness of breath and ongoing pain. She had been referred for home hospice at recent discharge due to severe heart failure and MR, not candidate for advanced therapies, and MitraClip would not significantly improve her symptoms. Her poor prognosis was discussed amongst multiple cardiologists, heart failure team, and hospitalist team during that admission. She was felt to be euvolemic at discharge but still with significant symptoms. Discharged with home hospice but was never contacted and was unable to contact them to establish care. She has been admitted but again appears to be optimized from volume standpoint, without ongoing AECOPD, and with no evidence of infection. No evidence of ACS at admission. She was kept inpatient until a home hospice agency could be assigned and confirmed. This has happened on 3/3, and the patient is clear for discharge. A prescription for oxycodone/APAP 10-325mg  was sent to the [facility] pharmacy at discharge for 8 tablets, no refills, to bridge the patient to in-home assessment by hospice tomorrow afternoon.  Assessment and Plan: Acute, recurrent dyspnea: Primary etiology appears to be anxiety superimposed on poor cardiopulmonary reserve due to CHF and COPD. She is not hypoxemic and has reassuring work up this  admission.  - Continue percocet.  - Continue supportive care.     End-stage HFrEF, severe MR: Hospice-appropriate.  - Palliative care consulted for additional support. Continue home hospice (confirmed prior to discharge) - Continue home medications including midodrine, SGLT2i, bumex.   Bipolar disorder, schizoaffective disorder, anxiety, depression: Also reported to have psychosocial stressors including previous homelessness.  - Psychiatry consult appreciated. Pt not taking sertraline or lurasidone. Started abilify and prn hydroxyzine.    Chronic pain syndrome: ECG, troponin trend ruled out for ACS. - Continue percocet PTA dosing, as discussed above risk of IV dilaudid outweighs potential benefit and was not continued. A prescription, after PDMP review, was provided at discharge for 8 tablets of percocet 10/325mg .    IDT2DM:  - Continue home Tx. Glycemic control improved now that not on steroid.   Hypokalemia:  - Supplemented and resolved.   Stage IIIb CKD: Stable.   HLD:  - Continue statin pending discussions with home hospice.   DNR, POA, GOC counseling:  - Palliative care assisted during hospitalization and pt will continue with hospice at discharge.       Pain control - [employer] Controlled Substance Reporting System database was reviewed. and patient was instructed, not to drive, operate heavy machinery, perform activities at heights, swimming or participation in water activities or provide baby-sitting services while on Pain, Sleep and Anxiety Medications; until their outpatient Physician has advised to do so again. Also recommended to not to take more than prescribed Pain, Sleep and Anxiety Medications.  Consultants: Palliative care, TOC Procedures performed: None  Disposition:  Home hospice Diet recommendation:  Discharge Diet Orders (From admission, onward)     Start  Ordered   08/11/23 0000  Diet - low sodium heart healthy        08/11/23 1134            DISCHARGE MEDICATION: Allergies as of 08/11/2023       Reactions   Tramadol Nausea Only, Rash   Wellbutrin [bupropion] Other (See Comments)   Behavioral changes (aggression, irritability)   Benadryl [diphenhydramine] Other (See Comments)   Decreases motor skills and increases anxiety   Lactose Intolerance (gi) Diarrhea   Nicotine Rash   Patient states she is allergic to the Nicotine patch and they cause her to break out in a rash.  She states she can smoke cigarettes.        Medication List     TAKE these medications    bumetanide 1 MG tablet Commonly known as: BUMEX Take 1 tablet (1 mg total) by mouth daily.   Dulera 200-5 MCG/ACT Aero Generic drug: mometasone-formoterol Inhale 2 puffs into the lungs 2 (two) times daily.   Insulin Pen Needle 32G X 4 MM Misc Use 3 (three) times daily.   ipratropium-albuterol 0.5-2.5 (3) MG/3ML Soln Commonly known as: DUONEB inhale 3 mLs by nebulization every 6 (six) hours as needed. What changed: reasons to take this   Jardiance 10 MG Tabs tablet Generic drug: empagliflozin Take 1 tablet (10 mg total) by mouth daily.   Lurasidone HCl 60 MG Tabs Take 1 tablet (60 mg total) by mouth daily with supper.   metFORMIN 1000 MG tablet Commonly known as: GLUCOPHAGE Take 1,000 mg by mouth 2 (two) times daily.   midodrine 10 MG tablet Commonly known as: PROAMATINE Take 1 tablet (10 mg total) by mouth 3 (three) times daily with meals.   OneTouch Delica Lancets 33G Misc Use 3 (three) times daily as directed to check blood sugar   OneTouch Delica Plus Lancing Misc Use 3 (three) times daily.   OneTouch Verio test strip Generic drug: glucose blood Use three times daily as directed to check blood sugar   oxyCODONE-acetaminophen 10-325 MG tablet Commonly known as: PERCOCET Take 1 tablet by mouth every 6 (six) hours as needed for pain. What changed: Another medication with the same name was removed. Continue taking this medication, and  follow the directions you see here.   promethazine 25 MG tablet Commonly known as: PHENERGAN Take 25 mg by mouth every 6 (six) hours as needed for nausea or vomiting.   rosuvastatin 20 MG tablet Commonly known as: CRESTOR Take 1 tablet (20 mg total) by mouth daily.   sertraline 100 MG tablet Commonly known as: ZOLOFT Take 1 tablet (100 mg total) by mouth daily.   Toujeo SoloStar 300 UNIT/ML Solostar Pen Generic drug: insulin glargine (1 Unit Dial) Inject 10 Units into the skin at bedtime. Take 15 units daily for 2 days while on prednisone and then go back to your home dose of 10 units daily at bedtime   Ventolin HFA 108 (90 Base) MCG/ACT inhaler Generic drug: albuterol Inhale 2 puffs into the lungs every 6 (six) hours as needed for wheezing or shortness of breath.        Follow-up Information     [facility] Follow up.   Contact information: [address] [place] [address] [phone]                Discharge Exam: Filed Weights   08/09/23 0405 08/10/23 0317 08/11/23 0435  Weight: 68.5 kg 71.7 kg 72.2 kg  BP 119/86 (  BP Location: Right Arm)   Pulse 90   Temp 99.4 F (37.4 C) (Oral)   Resp 15   Ht 5\' 5"  (1.651 m)   Wt 72.2 kg   LMP  (LMP Unknown) Comment: Pt is poor historian  SpO2 95%   BMI 26.49 kg/m   No distress, anxious-appearing Nonlabored without wheezes or crackles RRR Alert, oriented, no tremor  Condition at discharge: Stable for discharge  The results of significant diagnostics from this hospitalization (including imaging, microbiology, ancillary and laboratory) are listed below for reference.   Imaging Studies: DG Chest Portable 1 View Result Date: 08/06/2023 CLINICAL DATA:  Shortness of breath EXAM: PORTABLE CHEST 1 VIEW COMPARISON:  07/22/2023 FINDINGS: Stable cardiomegaly. No frank interstitial edema. No pleural effusion or pneumothorax. IMPRESSION: Stable cardiomegaly. No frank interstitial edema.  Electronically Signed   By: Charline Bills M.D.   On: 08/06/2023 02:14   DG CHEST PORT 1 VIEW Result Date: 07/22/2023 CLINICAL DATA:  Dyspnea. EXAM: PORTABLE CHEST 1 VIEW COMPARISON:  07/21/2023 FINDINGS: Tip of the right upper extremity PICC in the SVC. Cardiomegaly is stable. No pulmonary edema. No large pleural effusion. No evidence of focal airspace disease. No pneumothorax. IMPRESSION: Stable cardiomegaly.  No pulmonary edema or focal pulmonary process. Electronically Signed   By: Narda Rutherford M.D.   On: 07/22/2023 11:51   NM Pulmonary Perfusion Result Date: 07/21/2023 CLINICAL DATA:  Pulmonary hypertension. Congestive heart failure. Shortness of breath. EXAM: NUCLEAR MEDICINE PERFUSION LUNG SCAN TECHNIQUE: Perfusion images were obtained in multiple projections after intravenous injection of radiopharmaceutical. Ventilation scans intentionally deferred if perfusion scan and chest x-ray adequate for interpretation during COVID 19 epidemic. RADIOPHARMACEUTICALS:  4.3 mCi Tc-23m MAA IV COMPARISON:  Chest radiography same day FINDINGS: No perfusion defect to suggest embolic disease. Enlarged cardiac silhouette. No perfusion abnormality not attributable to the cardiomegaly. IMPRESSION: Enlarged cardiac silhouette. Otherwise negative pulmonary perfusion examination. Electronically Signed   By: Paulina Fusi M.D.   On: 07/21/2023 17:08   ECHOCARDIOGRAM LIMITED Result Date: 07/21/2023    ECHOCARDIOGRAM LIMITED REPORT   Patient Name:   JUPITER BOYS Date of Exam: 07/21/2023 Medical Rec #:  [id number]       Height:       65.0 in Accession #:    [id number]      Weight:       158.0 lb Date of Birth:  [birth year]       BSA:          1.790 m Patient Age:    53 years        BP:           112/73 mmHg Patient Gender: F               HR:           88 bpm. Exam Location:  Inpatient Procedure: Limited Echo, Color Doppler, Cardiac Doppler and Intracardiac            Opacification Agent Indications:    CHF  History:         Patient has prior history of Echocardiogram examinations, most                 recent 05/25/2023. CHF, COPD; Risk Factors:Diabetes and                 Hypertension.  Sonographer:    Melton Krebs RDCS, FE, PE Referring Phys: [id number] Perlie Gold IMPRESSIONS  1. Left ventricular ejection fraction, by estimation, is 20  to 25%. The left ventricle has severely decreased function. The left ventricle demonstrates global hypokinesis. The left ventricular internal cavity size was severely dilated. There is mild left ventricular hypertrophy.  2. Right ventricular systolic function is moderately reduced. The right ventricular size is normal.  3. The mitral valve is normal in structure. Severe mitral valve regurgitation. No evidence of mitral stenosis.  4. The aortic valve is tricuspid. Aortic valve regurgitation is trivial. No aortic stenosis is present.  5. The inferior vena cava is normal in size with greater than 50% respiratory variability, suggesting right atrial pressure of 3 mmHg. FINDINGS  Left Ventricle: Left ventricular ejection fraction, by estimation, is 20 to 25%. The left ventricle has severely decreased function. The left ventricle demonstrates global hypokinesis. Definity contrast agent was given IV to delineate the left ventricular endocardial borders. The left ventricular internal cavity size was severely dilated. There is mild left ventricular hypertrophy. Right Ventricle: The right ventricular size is normal. Right ventricular systolic function is moderately reduced. Left Atrium: Left atrial size was normal in size. Right Atrium: Right atrial size was normal in size. Pericardium: There is no evidence of pericardial effusion. Mitral Valve: The mitral valve is normal in structure. Severe mitral valve regurgitation. No evidence of mitral valve stenosis. Tricuspid Valve: The tricuspid valve is normal in structure. Tricuspid valve regurgitation is mild . No evidence of tricuspid stenosis. Aortic Valve: The  aortic valve is tricuspid. Aortic valve regurgitation is trivial. No aortic stenosis is present. Pulmonic Valve: The pulmonic valve was normal in structure. Pulmonic valve regurgitation is mild. No evidence of pulmonic stenosis. Aorta: The aortic root is normal in size and structure. Venous: The inferior vena cava is normal in size with greater than 50% respiratory variability, suggesting right atrial pressure of 3 mmHg. IAS/Shunts: There is right bowing of the interatrial septum, suggestive of elevated left atrial pressure. LEFT VENTRICLE PLAX 2D LVIDd:         7.20 cm LVIDs:         6.40 cm LV PW:         1.20 cm LV IVS:        1.20 cm  LV Volumes (MOD) LV vol d, MOD A2C: 248.0 ml LV vol d, MOD A4C: 246.0 ml LV vol s, MOD A2C: 197.0 ml LV vol s, MOD A4C: 186.0 ml LV SV MOD A2C:     51.0 ml LV SV MOD A4C:     246.0 ml LV SV MOD BP:      53.5 ml LEFT ATRIUM         Index LA diam:    5.40 cm 3.02 cm/m  TRICUSPID VALVE TR Peak grad:   42.0 mmHg TR Vmax:        324.00 cm/s Olga Millers MD Electronically signed by Olga Millers MD Signature Date/Time: 07/21/2023/3:17:20 PM    Final    DG CHEST PORT 1 VIEW Result Date: 07/21/2023 CLINICAL DATA:  Shortness of breath EXAM: PORTABLE CHEST 1 VIEW COMPARISON:  07/18/2023 FINDINGS: Cardiac shadow is enlarged but stable. Right-sided PICC is noted in satisfactory position. The lungs are clear bilaterally. No focal infiltrate or effusion is noted. No bony abnormality is seen. IMPRESSION: Stable cardiomegaly.  No acute abnormality noted. Electronically Signed   By: Alcide Clever M.D.   On: 07/21/2023 11:11   [place] EKG SITE RITE Result Date: 07/20/2023 If Site Rite image not attached, placement could not be confirmed due to current cardiac rhythm.  DG Chest Portable 1  View Result Date: 07/18/2023 CLINICAL DATA:  Shortness of breath EXAM: PORTABLE CHEST 1 VIEW COMPARISON:  Chest x-ray 07/05/2023 FINDINGS: Heart is moderately enlarged, unchanged. The lungs are clear. There is  no pleural effusion or pneumothorax. No acute fractures are seen. IMPRESSION: Moderate cardiomegaly. No acute cardiopulmonary process. Electronically Signed   By: Darliss Cheney M.D.   On: 07/18/2023 18:39    Microbiology: Results for orders placed or performed during the hospital encounter of 08/06/23  Resp panel by RT-PCR (RSV, Flu A&B, Covid) Anterior Nasal Swab     Status: None   Collection Time: 08/06/23  4:11 AM   Specimen: Anterior Nasal Swab  Result Value Ref Range Status   SARS Coronavirus 2 by RT PCR NEGATIVE NEGATIVE Final   Influenza A by PCR NEGATIVE NEGATIVE Final   Influenza B by PCR NEGATIVE NEGATIVE Final    Comment: (NOTE) The Xpert Xpress SARS-CoV-2/FLU/RSV plus assay is intended as an aid in the diagnosis of influenza from Nasopharyngeal swab specimens and should not be used as a sole basis for treatment. Nasal washings and aspirates are unacceptable for Xpert Xpress SARS-CoV-2/FLU/RSV testing.  Fact Sheet for Patients: BloggerCourse.com  Fact Sheet for Healthcare Providers: SeriousBroker.it  This test is not yet approved or cleared by the [place] FDA and has been authorized for detection and/or diagnosis of SARS-CoV-2 by FDA under an Emergency Use Authorization (EUA). This EUA will remain in effect (meaning this test can be used) for the duration of the COVID-19 declaration under Section 564(b)(1) of the Act, 21 U.S.C. section 360bbb-3(b)(1), unless the authorization is terminated or revoked.     Resp Syncytial Virus by PCR NEGATIVE NEGATIVE Final    Comment: (NOTE) Fact Sheet for Patients: BloggerCourse.com  Fact Sheet for Healthcare Providers: SeriousBroker.it  This test is not yet approved or cleared by the [place] FDA and has been authorized for detection and/or diagnosis of SARS-CoV-2 by FDA under an Emergency Use Authorization (EUA). This  EUA will remain in effect (meaning this test can be used) for the duration of the COVID-19 declaration under Section 564(b)(1) of the Act, 21 U.S.C. section 360bbb-3(b)(1), unless the authorization is terminated or revoked.  Performed at [facility], [address], [place], [place] [address]     Labs: CBC: Recent Labs  Lab 08/06/23 0207 08/06/23 1452 08/07/23 0326  WBC 7.8 9.4 9.1  NEUTROABS 5.0  --   --   HGB 11.8* 11.6* 13.2  HCT 37.8 37.5 43.1  MCV 86.3 86.2 87.4  PLT 255 243 225   Basic Metabolic Panel: Recent Labs  Lab 08/06/23 0207 08/06/23 1452 08/07/23 0326 08/10/23 0321 08/11/23 0327  NA 133*  --  136 134* 134*  K 3.7  --  3.1* 4.1 4.1  CL 100  --  94* 95* 97*  CO2 21*  --  24 27 29   GLUCOSE 466*  --  76 271* 284*  BUN 26*  --  36* 41* 32*  CREATININE 1.28* 1.39* 1.58* 1.34* 1.39*  CALCIUM 8.6*  --  8.6* 8.5* 8.2*  MG  --   --   --   --  2.1   Liver Function Tests: Recent Labs  Lab 08/06/23 0207 08/07/23 0326  AST 17 32  ALT 15 22  ALKPHOS 96 101  BILITOT 1.1 0.8  PROT 6.1* 6.6  ALBUMIN 3.2* 3.4*   CBG: Recent Labs  Lab 08/10/23 0611 08/10/23 1125 08/10/23 1647 08/10/23 2136 08/11/23 0554  GLUCAP 241* 239* 180*  135* 275*    Discharge time spent: greater than 30 minutes.  Signed: Tyrone Nine, MD [facility] 08/11/2023

## 2023-08-11 NOTE — Progress Notes (Signed)
 Discharge instructions reviewed with pt.  Copy of instructions given to pt. [facility] has filled scripts and will be picked up on the way to the discharge lounge. Pt has been provided a taxi voucher to take her home and will be given to the discharge lounge transporter.  Pt will be d/c'd via wheelchair with belongings and will be escorted by discharge lounge transport staff member.   Khamille Beynon,RN SWOT

## 2023-08-11 NOTE — Progress Notes (Signed)
 Mobility Specialist Progress Note:    08/11/23 1131  Mobility  Activity Ambulated with assistance in hallway  Level of Assistance Contact guard assist, steadying assist  Assistive Device Other (Comment) (hand rails)  Distance Ambulated (ft) 200 ft  Activity Response Tolerated well  Mobility Referral Yes  Mobility visit 1 Mobility  Mobility Specialist Start Time (ACUTE ONLY) 1120  Mobility Specialist Stop Time (ACUTE ONLY) 1130  Mobility Specialist Time Calculation (min) (ACUTE ONLY) 10 min   Pt received in bed, agreeable to mobility session. Ambulated in hallway with CGA (gait belt) and hand rails for support. SpO2 94-98% on RA throughout session. Took one standing rest break d/t labored breathing. Tolerated well, returned pt to room.Left with all needs met, call bell in reach.    Feliciana Rossetti Mobility Specialist Please contact via Special educational needs teacher or  Rehab office at [phone]

## 2023-08-11 NOTE — TOC Transition Note (Signed)
 Transition of Care (TOC) - Discharge Note Donn Pierini RN, BSN Transitions of Care Unit 4E- RN Case Manager See Treatment Team for direct phone #   Patient Details  Name: [name] [name] MRN: [id number] Date of Birth: [birth year]  Transition of Care [facility]) CM/SW Contact:  Darrold Span, RN Phone Number: 08/11/2023, 11:47 AM   Clinical Narrative:    Liaison with [facility] has notified this CM that they have confirmed this am that they are in-network with insurance and can accept referral- they will plan to do a start of care visit for initial intake tomorrow. They will contact the pt to schedule.   Cm has spoken with pt to inform her that [facility] will follow up on discharge.  CM to provide contact number on AVS.   Pt informed Cm that she would be going to stay with her son initially- Address provided as: [address] [place] [address] SonJudie Grieve contact #- [phone]  Updated [facility] liaison on address for discharge.   Pt also asked for transportation assistance- as she states he son can not pick her up. TOC will assist with [employer].      Final next level of care: Home w Hospice Care Barriers to Discharge: Barriers Resolved   Patient Goals and CMS Choice Patient states their goals for this hospitalization and ongoing recovery are:: return home w hospice   Choice offered to / list presented to : Patient      Discharge Placement               Home w/ Hospice         Discharge Plan and Services Additional resources added to the After Visit Summary for     Discharge Planning Services: CM Consult Post Acute Care Choice: Hospice                    HH Arranged: RN [facility] Agency: Other - See comment [facility]) Date [facility] Agency Contacted: 08/08/23 Time HH Agency Contacted: 1700 Representative spoke with at [facility] Agency: Patent examiner  Social Drivers of Health (SDOH) Interventions SDOH Screenings   Food Insecurity: No  Food Insecurity (08/06/2023)  Recent Concern: Food Insecurity - Food Insecurity Present (07/06/2023)  Housing: High Risk (08/06/2023)  Transportation Needs: No Transportation Needs (08/06/2023)  Utilities: Not At Risk (08/06/2023)  Alcohol Screen: Low Risk  (02/12/2023)  Depression (PHQ2-9): High Risk (09/30/2022)  Financial Resource Strain: High Risk (09/30/2022)  Social Connections: Moderately Isolated (08/06/2023)  Stress: No Stress Concern Present (01/07/2022)   Received from [facility]  Tobacco Use: High Risk (08/06/2023)     Readmission Risk Interventions    07/23/2023   12:02 PM 07/10/2023    1:21 PM 02/12/2023    5:00 PM  Readmission Risk Prevention Plan  Transportation Screening Complete Complete Complete  Medication Review Oceanographer) Complete Complete   PCP or Specialist appointment within 3-5 days of discharge Complete Complete   HRI or Home Care Consult Complete Complete   SW Recovery Care/Counseling Consult Complete Patient refused   Palliative Care Screening Not Applicable Not Applicable   Skilled Nursing Facility Not Applicable Not Applicable

## 2023-11-14 ENCOUNTER — Inpatient Hospital Stay (HOSPITAL_COMMUNITY): Payer: MEDICAID

## 2023-11-14 ENCOUNTER — Emergency Department (HOSPITAL_COMMUNITY): Payer: MEDICAID

## 2023-11-14 ENCOUNTER — Other Ambulatory Visit: Payer: Self-pay

## 2023-11-14 ENCOUNTER — Inpatient Hospital Stay (HOSPITAL_COMMUNITY)
Admission: EM | Admit: 2023-11-14 | Discharge: 2023-11-26 | DRG: 291 | Disposition: A | Discharge status: Hospice | Payer: MEDICAID | Attending: Family Medicine | Admitting: Family Medicine

## 2023-11-14 ENCOUNTER — Encounter (HOSPITAL_COMMUNITY): Payer: Self-pay

## 2023-11-14 DIAGNOSIS — D631 Anemia in chronic kidney disease: Secondary | ICD-10-CM | POA: Diagnosis present

## 2023-11-14 DIAGNOSIS — Z818 Family history of other mental and behavioral disorders: Secondary | ICD-10-CM

## 2023-11-14 DIAGNOSIS — F419 Anxiety disorder, unspecified: Secondary | ICD-10-CM | POA: Diagnosis present

## 2023-11-14 DIAGNOSIS — J441 Chronic obstructive pulmonary disease with (acute) exacerbation: Secondary | ICD-10-CM | POA: Diagnosis present

## 2023-11-14 DIAGNOSIS — E1122 Type 2 diabetes mellitus with diabetic chronic kidney disease: Secondary | ICD-10-CM | POA: Diagnosis present

## 2023-11-14 DIAGNOSIS — G894 Chronic pain syndrome: Secondary | ICD-10-CM | POA: Diagnosis present

## 2023-11-14 DIAGNOSIS — Z515 Encounter for palliative care: Secondary | ICD-10-CM

## 2023-11-14 DIAGNOSIS — Z7189 Other specified counseling: Secondary | ICD-10-CM

## 2023-11-14 DIAGNOSIS — I5042 Chronic combined systolic (congestive) and diastolic (congestive) heart failure: Secondary | ICD-10-CM | POA: Diagnosis not present

## 2023-11-14 DIAGNOSIS — Z789 Other specified health status: Secondary | ICD-10-CM

## 2023-11-14 DIAGNOSIS — R0600 Dyspnea, unspecified: Principal | ICD-10-CM

## 2023-11-14 DIAGNOSIS — Z833 Family history of diabetes mellitus: Secondary | ICD-10-CM

## 2023-11-14 DIAGNOSIS — K219 Gastro-esophageal reflux disease without esophagitis: Secondary | ICD-10-CM | POA: Diagnosis present

## 2023-11-14 DIAGNOSIS — E785 Hyperlipidemia, unspecified: Secondary | ICD-10-CM | POA: Diagnosis present

## 2023-11-14 DIAGNOSIS — K761 Chronic passive congestion of liver: Secondary | ICD-10-CM | POA: Diagnosis present

## 2023-11-14 DIAGNOSIS — E1165 Type 2 diabetes mellitus with hyperglycemia: Secondary | ICD-10-CM | POA: Diagnosis present

## 2023-11-14 DIAGNOSIS — R0602 Shortness of breath: Secondary | ICD-10-CM

## 2023-11-14 DIAGNOSIS — Z79899 Other long term (current) drug therapy: Secondary | ICD-10-CM

## 2023-11-14 DIAGNOSIS — I5084 End stage heart failure: Secondary | ICD-10-CM | POA: Diagnosis present

## 2023-11-14 DIAGNOSIS — I5023 Acute on chronic systolic (congestive) heart failure: Secondary | ICD-10-CM | POA: Diagnosis not present

## 2023-11-14 DIAGNOSIS — I509 Heart failure, unspecified: Secondary | ICD-10-CM | POA: Diagnosis not present

## 2023-11-14 DIAGNOSIS — N179 Acute kidney failure, unspecified: Secondary | ICD-10-CM | POA: Diagnosis present

## 2023-11-14 DIAGNOSIS — G8929 Other chronic pain: Secondary | ICD-10-CM | POA: Diagnosis not present

## 2023-11-14 DIAGNOSIS — Z8249 Family history of ischemic heart disease and other diseases of the circulatory system: Secondary | ICD-10-CM

## 2023-11-14 DIAGNOSIS — F17210 Nicotine dependence, cigarettes, uncomplicated: Secondary | ICD-10-CM | POA: Diagnosis present

## 2023-11-14 DIAGNOSIS — Z794 Long term (current) use of insulin: Secondary | ICD-10-CM

## 2023-11-14 DIAGNOSIS — Z66 Do not resuscitate: Secondary | ICD-10-CM | POA: Diagnosis present

## 2023-11-14 DIAGNOSIS — I5020 Unspecified systolic (congestive) heart failure: Secondary | ICD-10-CM | POA: Diagnosis not present

## 2023-11-14 DIAGNOSIS — Z7984 Long term (current) use of oral hypoglycemic drugs: Secondary | ICD-10-CM

## 2023-11-14 DIAGNOSIS — E039 Hypothyroidism, unspecified: Secondary | ICD-10-CM | POA: Diagnosis present

## 2023-11-14 DIAGNOSIS — Z5986 Financial insecurity: Secondary | ICD-10-CM

## 2023-11-14 DIAGNOSIS — F319 Bipolar disorder, unspecified: Secondary | ICD-10-CM | POA: Diagnosis present

## 2023-11-14 DIAGNOSIS — N1832 Chronic kidney disease, stage 3b: Secondary | ICD-10-CM | POA: Diagnosis present

## 2023-11-14 DIAGNOSIS — R079 Chest pain, unspecified: Secondary | ICD-10-CM | POA: Diagnosis not present

## 2023-11-14 DIAGNOSIS — I340 Nonrheumatic mitral (valve) insufficiency: Secondary | ICD-10-CM | POA: Diagnosis present

## 2023-11-14 DIAGNOSIS — I130 Hypertensive heart and chronic kidney disease with heart failure and stage 1 through stage 4 chronic kidney disease, or unspecified chronic kidney disease: Principal | ICD-10-CM | POA: Diagnosis present

## 2023-11-14 DIAGNOSIS — Z720 Tobacco use: Secondary | ICD-10-CM | POA: Diagnosis not present

## 2023-11-14 DIAGNOSIS — I700 Atherosclerosis of aorta: Secondary | ICD-10-CM | POA: Diagnosis present

## 2023-11-14 DIAGNOSIS — Z7951 Long term (current) use of inhaled steroids: Secondary | ICD-10-CM

## 2023-11-14 DIAGNOSIS — Z888 Allergy status to other drugs, medicaments and biological substances status: Secondary | ICD-10-CM

## 2023-11-14 DIAGNOSIS — I5043 Acute on chronic combined systolic (congestive) and diastolic (congestive) heart failure: Secondary | ICD-10-CM | POA: Diagnosis present

## 2023-11-14 DIAGNOSIS — E875 Hyperkalemia: Secondary | ICD-10-CM | POA: Diagnosis not present

## 2023-11-14 DIAGNOSIS — E876 Hypokalemia: Secondary | ICD-10-CM | POA: Diagnosis present

## 2023-11-14 DIAGNOSIS — K5900 Constipation, unspecified: Secondary | ICD-10-CM | POA: Diagnosis present

## 2023-11-14 DIAGNOSIS — I959 Hypotension, unspecified: Secondary | ICD-10-CM | POA: Diagnosis present

## 2023-11-14 DIAGNOSIS — R9431 Abnormal electrocardiogram [ECG] [EKG]: Secondary | ICD-10-CM | POA: Diagnosis present

## 2023-11-14 LAB — BASIC METABOLIC PANEL WITH GFR
Anion gap: 16 — ABNORMAL HIGH (ref 5–15)
Anion gap: 13 (ref 5–15)
BUN: 17 mg/dL (ref 6–20)
BUN: 20 mg/dL (ref 6–20)
CO2: 24 mmol/L (ref 22–32)
CO2: 24 mmol/L (ref 22–32)
Calcium: 8.6 mg/dL — ABNORMAL LOW (ref 8.9–10.3)
Calcium: 8.2 mg/dL — ABNORMAL LOW (ref 8.9–10.3)
Chloride: 99 mmol/L (ref 98–111)
Chloride: 94 mmol/L — ABNORMAL LOW (ref 98–111)
Creatinine, Ser: 1.12 mg/dL — ABNORMAL HIGH (ref 0.44–1.00)
Creatinine, Ser: 1.43 mg/dL — ABNORMAL HIGH (ref 0.44–1.00)
GFR, Estimated: 58 mL/min — ABNORMAL LOW (ref >60)
GFR, Estimated: 44 mL/min — ABNORMAL LOW (ref >60)
Glucose, Bld: 244 mg/dL — ABNORMAL HIGH (ref 70–99)
Glucose, Bld: 581 mg/dL (ref 70–99)
Potassium: 2.8 mmol/L — ABNORMAL LOW (ref 3.5–5.1)
Potassium: 4.8 mmol/L (ref 3.5–5.1)
Sodium: 139 mmol/L (ref 135–145)
Sodium: 131 mmol/L — ABNORMAL LOW (ref 135–145)

## 2023-11-14 LAB — CBC
HCT: 36.3 % (ref 36.0–46.0)
Hemoglobin: 11.6 g/dL — ABNORMAL LOW (ref 12.0–15.0)
MCH: 27.5 pg (ref 26.0–34.0)
MCHC: 32 g/dL (ref 30.0–36.0)
MCV: 86 fL (ref 80.0–100.0)
Platelets: 172 10*3/uL (ref 150–400)
RBC: 4.22 MIL/uL (ref 3.87–5.11)
RDW: 18 % — ABNORMAL HIGH (ref 11.5–15.5)
WBC: 7 10*3/uL (ref 4.0–10.5)
nRBC: 0 % (ref 0.0–0.2)

## 2023-11-14 LAB — BRAIN NATRIURETIC PEPTIDE: B Natriuretic Peptide: 2090.8 pg/mL — ABNORMAL HIGH (ref 0.0–100.0)

## 2023-11-14 LAB — HEPATIC FUNCTION PANEL
ALT: 16 U/L (ref 0–44)
AST: 28 U/L (ref 15–41)
Albumin: 3.4 g/dL — ABNORMAL LOW (ref 3.5–5.0)
Alkaline Phosphatase: 79 U/L (ref 38–126)
Bilirubin, Direct: 0.3 mg/dL — ABNORMAL HIGH (ref 0.0–0.2)
Indirect Bilirubin: 1.1 mg/dL — ABNORMAL HIGH (ref 0.3–0.9)
Total Bilirubin: 1.4 mg/dL — ABNORMAL HIGH (ref 0.0–1.2)
Total Protein: 6.1 g/dL — ABNORMAL LOW (ref 6.5–8.1)

## 2023-11-14 LAB — TROPONIN I (HIGH SENSITIVITY)
Troponin I (High Sensitivity): 50 ng/L — ABNORMAL HIGH (ref <18)
Troponin I (High Sensitivity): 47 ng/L — ABNORMAL HIGH (ref <18)

## 2023-11-14 LAB — D-DIMER, QUANTITATIVE: D-Dimer, Quant: 2.96 ug{FEU}/mL — ABNORMAL HIGH (ref 0.00–0.50)

## 2023-11-14 LAB — PROCALCITONIN: Procalcitonin: 0.16 ng/mL

## 2023-11-14 LAB — CBG MONITORING, ED: Glucose-Capillary: >600 mg/dL (ref 70–99)

## 2023-11-14 MED ORDER — IOHEXOL 350 MG/ML SOLN
75.0000 mL | Freq: Once | INTRAVENOUS | Status: AC | PRN
  Administered 2023-11-14: 75 mL via INTRAVENOUS

## 2023-11-14 MED ORDER — MIDODRINE HCL 5 MG PO TABS
10.0000 mg | ORAL_TABLET | Freq: Three times a day (TID) | ORAL | Status: DC
  Administered 2023-11-14 – 2023-11-16 (×6): 10 mg via ORAL
  Filled 2023-11-14 (×6): qty 2

## 2023-11-14 MED ORDER — INSULIN REGULAR(HUMAN) IN NACL 100-0.9 UT/100ML-% IV SOLN
INTRAVENOUS | Status: DC
  Administered 2023-11-14: 9.5 [IU]/h via INTRAVENOUS
  Filled 2023-11-14: qty 100

## 2023-11-14 MED ORDER — ALBUTEROL SULFATE (2.5 MG/3ML) 0.083% IN NEBU
2.5000 mg | INHALATION_SOLUTION | Freq: Four times a day (QID) | RESPIRATORY_TRACT | Status: DC | PRN
  Administered 2023-11-21: 2.5 mg via RESPIRATORY_TRACT
  Filled 2023-11-14: qty 3

## 2023-11-14 MED ORDER — INSULIN ASPART 100 UNIT/ML IJ SOLN
10.0000 [IU] | Freq: Once | INTRAMUSCULAR | Status: AC
  Administered 2023-11-14: 10 [IU] via SUBCUTANEOUS

## 2023-11-14 MED ORDER — POTASSIUM CHLORIDE 20 MEQ PO PACK
40.0000 meq | PACK | ORAL | Status: AC
  Administered 2023-11-14: 40 meq via ORAL
  Filled 2023-11-14: qty 2

## 2023-11-14 MED ORDER — HYDROMORPHONE HCL 1 MG/ML IJ SOLN
0.5000 mg | INTRAMUSCULAR | Status: DC | PRN
  Administered 2023-11-14 – 2023-11-15 (×6): 0.5 mg via INTRAVENOUS
  Filled 2023-11-14 (×7): qty 1

## 2023-11-14 MED ORDER — LORAZEPAM 1 MG PO TABS
1.0000 mg | ORAL_TABLET | Freq: Once | ORAL | Status: AC
  Administered 2023-11-14: 1 mg via ORAL
  Filled 2023-11-14: qty 1

## 2023-11-14 MED ORDER — NICOTINE POLACRILEX 2 MG MT GUM
2.0000 mg | CHEWING_GUM | OROMUCOSAL | Status: DC | PRN
  Administered 2023-11-23 (×2): 2 mg via ORAL
  Filled 2023-11-14 (×3): qty 1

## 2023-11-14 MED ORDER — ACETAMINOPHEN 650 MG RE SUPP: 650.0000 mg | Freq: Four times a day (QID) | RECTAL | Status: DC | PRN

## 2023-11-14 MED ORDER — ACETAMINOPHEN 325 MG PO TABS: 650.0000 mg | ORAL_TABLET | Freq: Four times a day (QID) | ORAL | Status: DC | PRN

## 2023-11-14 MED ORDER — OXYCODONE-ACETAMINOPHEN 5-325 MG PO TABS
2.0000 | ORAL_TABLET | Freq: Once | ORAL | Status: AC
  Administered 2023-11-14: 2 via ORAL
  Filled 2023-11-14: qty 2

## 2023-11-14 MED ORDER — PROCHLORPERAZINE EDISYLATE 10 MG/2ML IJ SOLN
5.0000 mg | Freq: Once | INTRAMUSCULAR | Status: AC
  Administered 2023-11-14: 5 mg via INTRAVENOUS
  Filled 2023-11-14: qty 2

## 2023-11-14 MED ORDER — OXYCODONE HCL 5 MG PO TABS
5.0000 mg | ORAL_TABLET | ORAL | Status: AC
  Administered 2023-11-14: 5 mg via ORAL
  Filled 2023-11-14: qty 1

## 2023-11-14 MED ORDER — DEXTROSE-SODIUM CHLORIDE 5-0.45 % IV SOLN: INTRAVENOUS | Status: DC

## 2023-11-14 MED ORDER — FUROSEMIDE 10 MG/ML IJ SOLN
40.0000 mg | Freq: Once | INTRAMUSCULAR | Status: AC
  Administered 2023-11-14: 40 mg via INTRAVENOUS
  Filled 2023-11-14: qty 4

## 2023-11-14 MED ORDER — LACTATED RINGERS IV SOLN: INTRAVENOUS | Status: DC

## 2023-11-14 MED ORDER — POTASSIUM CHLORIDE 20 MEQ PO PACK
80.0000 meq | PACK | ORAL | Status: AC
  Administered 2023-11-14: 80 meq via ORAL
  Filled 2023-11-14: qty 4

## 2023-11-14 MED ORDER — FLUTICASONE FUROATE-VILANTEROL 200-25 MCG/ACT IN AEPB
1.0000 | INHALATION_SPRAY | Freq: Every day | RESPIRATORY_TRACT | Status: DC
  Administered 2023-11-14 – 2023-11-23 (×10): 1 via RESPIRATORY_TRACT
  Filled 2023-11-14 (×3): qty 28

## 2023-11-14 MED ORDER — SODIUM CHLORIDE 0.9 % IV SOLN
500.0000 mg | Freq: Once | INTRAVENOUS | Status: AC
  Administered 2023-11-14: 500 mg via INTRAVENOUS
  Filled 2023-11-14: qty 5

## 2023-11-14 MED ORDER — DEXTROSE 50 % IV SOLN: 0.0000 mL | INTRAVENOUS | Status: DC | PRN

## 2023-11-14 MED ORDER — ENOXAPARIN SODIUM 40 MG/0.4ML IJ SOSY
40.0000 mg | PREFILLED_SYRINGE | INTRAMUSCULAR | Status: DC
Start: 2023-11-14 — End: 2023-11-16
  Administered 2023-11-14 – 2023-11-15 (×2): 40 mg via SUBCUTANEOUS
  Filled 2023-11-14 (×2): qty 0.4

## 2023-11-14 MED ORDER — MORPHINE SULFATE (PF) 2 MG/ML IV SOLN
2.0000 mg | Freq: Once | INTRAVENOUS | Status: AC
  Administered 2023-11-14: 2 mg via INTRAVENOUS
  Filled 2023-11-14: qty 1

## 2023-11-14 MED ORDER — INSULIN ASPART 100 UNIT/ML IJ SOLN: 0.0000 [IU] | Freq: Three times a day (TID) | INTRAMUSCULAR | Status: DC

## 2023-11-14 MED ORDER — ONDANSETRON HCL 4 MG/2ML IJ SOLN
4.0000 mg | Freq: Once | INTRAMUSCULAR | Status: AC
  Administered 2023-11-14: 4 mg via INTRAVENOUS
  Filled 2023-11-14: qty 2

## 2023-11-14 MED ORDER — SODIUM CHLORIDE 0.9 % IV SOLN
1.0000 g | Freq: Once | INTRAVENOUS | Status: AC
  Administered 2023-11-14: 1 g via INTRAVENOUS
  Filled 2023-11-14: qty 10

## 2023-11-14 NOTE — Assessment & Plan Note (Addendum)
 BP 80-110s/44-70s. - Midodrine  10 mg TID

## 2023-11-14 NOTE — ED Notes (Signed)
Received report from [place], Therapist, sports.

## 2023-11-14 NOTE — Plan of Care (Signed)
 FMTS Brief Progress Note  S: Went to patient bedside for night rounds with Dr. Ronson.  Patient reports midline chest pain that was partially relieved with pain medicine earlier today, states the pain has returned to what it was prior to analgesics. Reports that Dilaudid  seems to help the most earlier, last dose was about 4 hours ago.  Denies other concerns at this time.   O: BP 106/66   Pulse (!) 106   Temp 97.6 F (36.4 C) (Oral)   Resp (!) 25   Ht 5\' 4"  (1.626 m)   Wt 65.8 kg   LMP  (LMP Unknown) Comment: Pt is poor historian  SpO2 98%   BMI 24.89 kg/m   General: pt lying on side in fetal position in ED bed, awake and alert, in some distress due to chest pain CV: RRR, normal S1/S2 Pulm: CTAB with somewhat shallow breathing Neuro: no focal deficits  A/P: CHF exacerbation -f/u CTA PE study due to D-dimer elevation -messaged ED RN to give pt another dose of PRN dilaudid  0.5mg  -Continue remainder of plan per H&P - Orders reviewed. Labs for AM ordered, which was adjusted as needed.   Hypokalemia -pending repeat BMP (ordered STAT) -plan to give IV Kcl depending on degree of hypokalemia  Hyperglycemia Found to have CBG >600 -plan to start IV insulin  via [employer] once stat BMP returns -discussed fluid replacement plan with pharmacy given patient's CHF, will plan to give 10mL/hr LR + 60mL/hr D51/2NS  Naida Austria, MD 11/14/2023, 7:44 PM PGY-1, [place] [facility] Night Resident  Please page [phone] with questions.

## 2023-11-14 NOTE — Progress Notes (Signed)
   FMTS Attending Admission Note: Otho Blitz, MD   For questions about this patient, please use [email or web address] to page the family medicine resident on call. Pager number [phone].    I have seen and examined this patient, and reviewed their chart. I have discussed this patient with the resident.  I will sign the full resident note as it is available.  53 yo with history of severe MR, heart failure with reduced ejection fraction, chronic obstructive pulmonary disease and bipolar 1 disorder presenting with dyspnea and chest pain.  Most likely this represents an exacerbation of her heart failure with reduced ejection fraction and her symptoms from her severe mitral regurgitation.  She has been on home hospice but feels her symptoms are uncontrolled.  She is amenable to hospital admission for symptom management and optimization of her medications.  Also considered concomitant pneumonia or pulmonary embolism as a cause of her symptoms which will further evaluate as described below.  Past medical history additionally notable for asthma Surgical history is reviewed Socially the patient is unhoused.  She is current living with a friend and on home hospice.  Labs are reviewed and notable for a potassium of 2.8, glucose of 2-44, creatinine below her baseline and a slightly elevated anion gap.  Brain atretic peptide is elevated.  Troponin is flat.  Sent a D-dimer and procalcitonin.  On exam the patient is lying at 30 degrees.  She is dyspneic with speaking in sentences.  Tachypnea noted.  Elevated JVP.  Tachycardic on exam significant murmur at the left lower sternal border.  Lungs with bilateral crackles.  Abdomen is slightly distended.   EKG  is reviewed no acute changes from prior but she does have a markedly prolonged QT.  Acute on chronic heart failure, reduced EF, has chronic chest pain with this. Will diurese as BP allows today and adjust pain medications. Will ask Cardiology to weigh in on regimen as  well. Procalcitonin and d-dimer sent as patient wants what is treatable treated.  Hypokalemia- supplemented and repeat this PM. Repeat EKG for Qtc  Elevated total bilirubin likely due to hepatic congestion. Anemia, likely due to chronic disease, monitor.  Will sign resident note as available.

## 2023-11-14 NOTE — Assessment & Plan Note (Addendum)
 COPD/Asthma: Continuing home Dulera  with formulary alternative (Breo Ellipta) and albuterol  2.5 mg as needed Anemia of chronic disease: Hgb 11.6, will monitor T2DM: Sensitive SSI, monitor CBGs qACHS.  Home regimen includes: metformin  1000 mg twice daily and insulin  10 units at bedtime (patient not taking consistently).  Holding metformin  while inpatient Chronic Pain Syndrome: Low back and neck pain. On Oxycodone  15 mg q6h at home. Will start Dilaudid  0.5 mg q4h prn and Tylenol  q6h prn Smoking Hx: Smoked 1/4 ppd, and has been smoking since 53 yo. Started Nicorette  gum since she has had a reaction to the patch before HLD: On Crestor  20 mg daily; holding while determine GOC Bipolar disorder/anxiety/depression: Patient not taking Abilify , Atarax , or sertraline  as listed in her home medications.

## 2023-11-14 NOTE — ED Notes (Signed)
 Palliative care at bedside.

## 2023-11-14 NOTE — ED Notes (Signed)
 Checked patient's BS and it is greater than 600, ED secretary paged admitting dr, awaiting a return call for updated plan of care concerning hyperglycemia. Will continue to monitor.

## 2023-11-14 NOTE — ED Notes (Signed)
 Pt moved to room 15 for higher level of care.

## 2023-11-14 NOTE — ED Provider Notes (Addendum)
 [place] EMERGENCY DEPARTMENT AT [facility] Provider Note   CSN: [id number] Arrival date & time: 11/14/23  4098     History  Chief Complaint  Patient presents with   Shortness of Breath    [name] [name] is a 53 y.o. female.  HPI 53 year old female history of bipolar disorder, hypertension, CHF with LA VI EF 20 to 25% with severe MR, stage IIIb CKD, hypothyroidism, ongoing tobacco use, suspected COPD presents today complaining of chest pain and shortness of breath.  She reports that she has been short of breath intermittently over the past week worsening with laying down and with exertion.  Since last night she has been having sharp left anterior sided chest pain.  She reports using her steroid inhaler at home.  She has been coughing but it has not been productive.  She denies fever, chills, nausea, vomiting.  She reports she has had similar symptoms in the past. Review of records with discharge from February 2025 showed that she was admitted with a diagnosis of acute on chronic systolic congestive heart failure.  At that time it was deemed that she was not a candidate for advanced therapies for her MR and felt to have extremely poor prognosis.  Subsequently, she was discharged home to hospice.  She reports that she was on hospice care for 3 months but was then discharged from hospice care.  She reports that she has not had a primary care physician but had been being seen by hospice. Discharge diagnoses note acute recurrent dyspnea and they were continuing outpatient Percocet and supportive care.  End-stage congestive heart failure with palliative care consulted for additional support and home hospice the she was discharged on midodrine  SGLT T2 high and Bumex , chronic pain syndrome, type 2 diabetes and stable CKD with DNR in place.     Home Medications Prior to Admission medications   Medication Sig Start Date End Date Taking? Authorizing Provider  albuterol  (VENTOLIN  HFA) 108  (90 Base) MCG/ACT inhaler Inhale 2 puffs into the lungs every 6 (six) hours as needed for wheezing or shortness of breath. 09/27/22   Arrien, Curlee Doss, MD  ARIPiprazole  (ABILIFY ) 5 MG tablet Take 1 tablet (5 mg total) by mouth daily. 08/12/23   Wynetta Heckle, MD  bumetanide  (BUMEX ) 1 MG tablet Take 1 tablet (1 mg total) by mouth daily. 05/27/23 06/26/23  Arrien, Curlee Doss, MD  glucose blood [facility] VERIO) test strip Use three times daily as directed to check blood sugar 02/11/23   Alexander-Savino, [place], MD  hydrOXYzine  (ATARAX ) 25 MG tablet Take 2 tablets (50 mg total) by mouth 3 (three) times daily as needed for anxiety. 08/11/23   Wynetta Heckle, MD  insulin  glargine, 1 Unit Dial , (TOUJEO  SOLOSTAR) 300 UNIT/ML Solostar Pen Inject 10 Units into the skin at bedtime. Take 15 units daily for 2 days while on prednisone  and then go back to your home dose of 10 units daily at bedtime 07/10/23 08/09/23  Deforest Fast, MD  Insulin  Pen Needle 32G X 4 MM MISC Use 3 (three) times daily. 02/11/23   Alexander-Savino, [place], MD  ipratropium-albuterol  (DUONEB) 0.5-2.5 (3) MG/3ML SOLN inhale 3 mLs by nebulization every 6 (six) hours as needed. Patient taking differently: Take 3 mLs by nebulization every 6 (six) hours as needed (for shortness of breath). 05/27/23   Arrien, Curlee Doss, MD  Lancet Devices [facility] DELICA PLUS LANCING) MISC Use 3 (three) times daily. 02/11/23   Alexander-Savino, [place], MD  metFORMIN  (GLUCOPHAGE ) 1000  MG tablet Take 1,000 mg by mouth 2 (two) times daily. 07/29/23   [provider]  midodrine  (PROAMATINE ) 10 MG tablet Take 1 tablet (10 mg total) by mouth 3 (three) times daily with meals. 05/27/23   Arrien, Mauricio Daniel, MD  mometasone -formoterol  (DULERA ) 200-5 MCG/ACT AERO Inhale 2 puffs into the lungs 2 (two) times daily. 05/27/23 06/26/23  Arrien, Curlee Doss, MD  OneTouch Delica Lancets 33G MISC Use 3 (three) times daily as directed to check blood sugar  02/11/23   Alexander-Savino, [place], MD  oxyCODONE -acetaminophen  (PERCOCET) 10-325 MG tablet Take 1 tablet by mouth every 6 (six) hours as needed for pain. 08/11/23   Wynetta Heckle, MD  promethazine  (PHENERGAN ) 25 MG tablet Take 25 mg by mouth every 6 (six) hours as needed for nausea or vomiting.    [provider]  rosuvastatin  (CRESTOR ) 20 MG tablet Take 1 tablet (20 mg total) by mouth daily. 05/27/23 06/26/23  Arrien, Curlee Doss, MD  benztropine  (COGENTIN ) 1 MG tablet Take 1 tablet (1 mg total) by mouth 2 (two) times daily. 12/23/18 06/04/20  Suzi Essex, MD  insulin  aspart (NOVOLOG ) 100 UNIT/ML injection Inject 0-9 Units into the skin 3 (three) times daily with meals. Patient not taking: Reported on 06/03/2020 12/16/18 06/04/20  Lesa Rape, MD      Allergies    Tramadol , Wellbutrin [bupropion], Benadryl  [diphenhydramine ], Lactose intolerance (gi), and Nicotine     Review of Systems   Review of Systems  Physical Exam Updated Vital Signs BP 109/72   Pulse 95   Temp 98.2 F (36.8 C) (Oral)   Resp (!) 21   Ht 1.626 m (5\' 4" )   Wt 65.8 kg   LMP  (LMP Unknown) Comment: Pt is poor historian  SpO2 96%   BMI 24.89 kg/m  Physical Exam Vitals and nursing note reviewed.  Constitutional:      General: She is not in acute distress.    Appearance: She is well-developed. She is ill-appearing.  HENT:     Head: Normocephalic.     Mouth/Throat:     Mouth: Mucous membranes are moist.  Eyes:     Extraocular Movements: Extraocular movements intact.     Pupils: Pupils are equal, round, and reactive to light.  Cardiovascular:     Rate and Rhythm: Normal rate and regular rhythm.  Pulmonary:     Effort: Pulmonary effort is normal.     Breath sounds: Normal breath sounds.  Abdominal:     Palpations: Abdomen is soft.  Musculoskeletal:        General: Normal range of motion.     Cervical back: Normal range of motion and neck supple.  Skin:    General: Skin is warm and dry.      Capillary Refill: Capillary refill takes less than 2 seconds.  Neurological:     General: No focal deficit present.     Mental Status: She is alert.  Psychiatric:        Mood and Affect: Mood normal.     ED Results / Procedures / Treatments   Labs (all labs ordered are listed, but only abnormal results are displayed) Labs Reviewed  BASIC METABOLIC PANEL WITH GFR - Abnormal; Notable for the following components:      Result Value   Potassium 2.8 (*)    Glucose, Bld 244 (*)    Creatinine, Ser 1.12 (*)    Calcium  8.6 (*)    GFR, Estimated 58 (*)    Anion gap 16 (*)  All other components within normal limits  CBC - Abnormal; Notable for the following components:   Hemoglobin 11.6 (*)    RDW 18.0 (*)    All other components within normal limits  BRAIN NATRIURETIC PEPTIDE - Abnormal; Notable for the following components:   B Natriuretic Peptide 2,090.8 (*)    All other components within normal limits  HEPATIC FUNCTION PANEL - Abnormal; Notable for the following components:   Total Protein 6.1 (*)    Albumin  3.4 (*)    Total Bilirubin 1.4 (*)    Bilirubin, Direct 0.3 (*)    Indirect Bilirubin 1.1 (*)    All other components within normal limits  TROPONIN I (HIGH SENSITIVITY) - Abnormal; Notable for the following components:   Troponin I (High Sensitivity) 50 (*)    All other components within normal limits  TROPONIN I (HIGH SENSITIVITY) - Abnormal; Notable for the following components:   Troponin I (High Sensitivity) 47 (*)    All other components within normal limits    EKG EKG Interpretation Date/Time:  Friday November 14 2023 06:31:24 EDT Ventricular Rate:  82 PR Interval:  171 QRS Duration:  118 QT Interval:  491 QTC Calculation: 574 R Axis:   81  Text Interpretation: Sinus rhythm Biatrial enlargement Left ventricular hypertrophy Anterior Q waves, possibly due to LVH Nonspecific T abnormalities, lateral leads Prolonged QT interval No significant change was found  Confirmed by Earma Gloss [phone]) on 11/14/2023 7:00:17 AM  Radiology DG Chest Portable 1 View Result Date: 11/14/2023 CLINICAL DATA:  Chest pain and shortness of breath. EXAM: PORTABLE CHEST 1 VIEW COMPARISON:  08/06/2023 FINDINGS: Stable cardiac enlargement. No significant scratch set no pleural effusion or pneumothorax. Mild hazy asymmetric increase opacities over the right lower lung noted. Left lung appears clear. Visualized osseous structures are unremarkable. IMPRESSION: Mild hazy asymmetric increase opacities over the right lower lung may reflect early infiltrate versus atypical infection. Electronically Signed   By: Kimberley Penman M.D.   On: 11/14/2023 07:17    Procedures .Critical Care  Performed by: Auston Blush, MD Authorized by: Auston Blush, MD   Critical care provider statement:    Critical care time (minutes):  75   Critical care end time:  11/14/2023 11:55 AM   Critical care time was exclusive of:  Separately billable procedures and treating other patients and teaching time   Critical care was necessary to treat or prevent imminent or life-threatening deterioration of the following conditions:  Cardiac failure and respiratory failure   Critical care was time spent personally by me on the following activities:  Development of treatment plan with patient or surrogate, discussions with consultants, evaluation of patient's response to treatment, examination of patient, ordering and review of laboratory studies, ordering and review of radiographic studies, ordering and performing treatments and interventions, pulse oximetry, re-evaluation of patient's condition and review of old charts     Medications Ordered in ED Medications  azithromycin (ZITHROMAX) 500 mg in sodium chloride  0.9 % 250 mL IVPB (500 mg Intravenous New Bag/Given 11/14/23 1114)  oxyCODONE -acetaminophen  (PERCOCET/ROXICET) 5-325 MG per tablet 2 tablet (2 tablets Oral Given 11/14/23 0721)  ondansetron  (ZOFRAN ) injection  4 mg (4 mg Intravenous Given 11/14/23 0805)  potassium chloride  (KLOR-CON ) packet 80 mEq (80 mEq Oral Given 11/14/23 0804)  oxyCODONE  (Oxy IR/ROXICODONE ) immediate release tablet 5 mg (5 mg Oral Given 11/14/23 0954)  cefTRIAXone  (ROCEPHIN ) 1 g in sodium chloride  0.9 % 100 mL IVPB (0 g Intravenous Stopped 11/14/23 1113)  LORazepam  (ATIVAN ) tablet  1 mg (1 mg Oral Given 11/14/23 1130)    ED Course/ Medical Decision Making/ A&P Clinical Course as of 11/14/23 1155  Fri Nov 14, 2023  0845 Troponin elevated at 50 with repeat pending [DR]  0845 Review of records show that she has been chronically elevated with troponins 42 8891 on the last 3 checks. [DR]    Clinical Course User Index [DR] Auston Blush, MD                                 Medical Decision Making Amount and/or Complexity of Data Reviewed Labs: ordered.  Risk Prescription drug management.   Patient presents today complaining of dyspnea and chest pain Differential diagnosis includes but is not limited to acute coronary syndrome ACS EKG obtained and troponins pending.  No evidence of acute ST elevation although some nonspecific changes which are slightly different from first prior are present.  Pain is sharp and does not sound specifically cardiac in nature although it is on the left side of her chest.  Awaiting troponins and will intervene with pain medication Chest pain includes differential of acute infection and other acute intrathoracic etiologies.  Chest x-Chelise Hanger obtained shows no evidence of definitive infiltrate or pneumothorax Patient is dyspneic which is chronic.  She has a normal heart rate.  No history of DVT or PE.  Legs are not swollen. Patient also has chronic pain syndrome which has resulted in chronic chest pain.  She is requesting pain medicine.  Review of records show that she has been getting 28 Percocet per week.  Appears that she last had this 1 week ago today.  2 Percocet are ordered Reviewed last cardiology note from  07/23/2023 at that time they noted acute on chronic send systolic heart failure BNP was 4443, thought to be euvolemic echo from 07/21/2023 showed EF of 20 to 25% with global hypokinesis and severe MR she was to go home on midodrine  and Bumex  and with her severe MR she was felt not to be a good surgical candidate 1 chest pain EKG without acute ischemia and first troponin is slightly elevated. Patient feels like her chest pain is different than her chronic pain.  She is having ongoing chest pain. 2 dyspnea patient has normal oxygen  saturation  and respiratory rate is slightly increased on my evaluation.  Patient feels like she has had some increased fluid retention with increased abdominal girth and facial swelling.  BNP is elevated at 2090.  She states that she normally weighs in at 160 pounds however weight appears to be down here.  She feels like she has decreased urine output.  He has she has been taking her Bumex .  She is not given any additional Bumex  here as initial blood pressure was systolically in the 90s.  It is improved here without intervention but I have hesitant to give diuretics with borderline blood pressure. Chest x-Nancylee Gaines with mild hazy asymmetric markings and opacities over the right lower lung which could reflect early infiltrate versus atypical infection versus volume overload Patient given Rocephin  and Zithromax here in ED. 3 hypokalemia patient be given oral repletion 4- patient with known chronic heart failure with last EF 20 to 25%.  She appears to be somewhat decompensated today with BNP elevated 2090 with symptoms.  Plan consult medicine for admission and cardiology for assistance with symptom management Care discussed with family practice and they will see for admission Consult placed to  cardiology  Discussed with Cardiology who will see in consult.      Final Clinical Impression(s) / ED Diagnoses Final diagnoses:  None    Rx / DC Orders ED Discharge Orders     None          Auston Blush, MD 11/14/23 1155    Auston Blush, MD 11/14/23 1230

## 2023-11-14 NOTE — H&P (Addendum)
 Hospital Admission History and Physical Service Pager: [phone]  Patient name: [name] [name] Medical record number: [id number] Date of Birth: 08-08-1970 Age: 53 y.o. Gender: female  Primary Care Provider: [facility], [facility] Medical Consultants: Cardiology Code Status: DNR/ DIN Preferred Emergency Contact:  Contact Information     Name Relation Home Work [place] Son   [phone]   Chief Complaint: Shortness of breath  Assessment and Plan: [name] [name] is a 53 y.o. female with PMH of HFrEF, severe MR, COPD, and bipolar 1 disorder presenting with dyspnea and chest pain. Differential for this patient's presentation of this includes: Acute on chronic CHF exacerbation - most likely due to her elevated BNP to 2090, tachypnea, orthopnea, and abdominal distention from baseline. PNA - possible with CXR findings of asymmetric increased opacities in RLL.  S/p ceftriaxone  and azithromycin in ED.  No history of fevers.  Procalcitonin negative, therefore low indication to continue abx. PE - possible with her CP, dyspnea, and tachycardia.  Will get D-dimer to determine if CTPE is necessary. COPD Exacerbation - patient is a long-term daily smoker, possibly had a recent viral illness due to her symptoms of rhinorrhea and coughing (nonproductive), currently is not on oxygen , but tachypneic ACS - unlikely with no acute acute EKG changes and downtrending troponins  Patient has been recently on home hospice, but feels like her symptoms are uncontrolled and she is amenable to hospital admission for symptomatic management.  Palliative care on board and has already discussed her goals which include returning home with hospice. Assessment & Plan Acute exacerbation of congestive heart failure (HCC) Most likely secondary to acute on chronic congestive heart failure with presenting symptoms (dyspnea, orthopnea, mild abdominal distention), BNP elevated to 2,090, and elevated JVP on exam.  Has  been previously admitted for CHF exacerbation, most recently in February 2025.  Echo at the time demonstrated EF of 20 to 25%, global hypokinesis, mild LVH, severe MVR, IVC with > 50% respiratory variability.  Plan to diurese as able while monitoring BP.  Appreciate cardiology recommendations. - Admit to FMTS, attending to [facility] - Med-tele, Vital signs per floor - Regular diet  - VTE prophylaxis: Lovenox  - IV Lasix  40 mg - Holding home meds of Bumex  and Jardiance , pending cards recs - Procalcitonin ordered (0.16), low indication to continue to abx - D-dimer elevated (2.96), ordered CTPE to rule out PE - AM BMP and Mag - Electrolyte goals: K>4, Mag>2 - Daily weights; unsure of dry weight - Strict I/Os - Cardiac monitoring  - Cardiology consulted, appreciate recommendations Hypokalemia K of 2.8 in the ED.  Repleted with 80 mEq. - Klor-Con  40 mEq with Lasix  - BMP at 7 PM - Monitor with AM BMP Hypotension BP 80-110s/44-70s. - Midodrine  10 mg TID Prolonged QT interval QTc prolonged to 574.  - Avoid QT prolonging meds - Repeat EKG Goals of care, counseling/discussion Palliative care saw patient in the ED and she would like to continue home with hospice care after this admission. - Palliative care consulted, appreciate recommendations and discussion of GOC Chronic health problem COPD/Asthma: Continuing home Dulera  with formulary alternative (Breo Ellipta) and albuterol  2.5 mg as needed Anemia of chronic disease: Hgb 11.6, will monitor T2DM: Sensitive SSI, monitor CBGs qACHS.  Home regimen includes: metformin  1000 mg twice daily and insulin  10 units at bedtime (patient not taking consistently).  Holding metformin  while inpatient Chronic Pain Syndrome: Low back and neck pain. On Oxycodone  15 mg q6h at home. Will  start Dilaudid  0.5 mg q4h prn and Tylenol  q6h prn Smoking Hx: Smoked 1/4 ppd, and has been smoking since 53 yo. Started Nicorette  gum since she has had a reaction to the patch  before HLD: On Crestor  20 mg daily; holding while determine GOC Bipolar disorder/anxiety/depression: Patient not taking Abilify , Atarax , or sertraline  as listed in her home medications.  FEN/GI: Regular Diet VTE Prophylaxis: Lovenox   Disposition: Med-Tele  History of Present Illness:  [name] [name] is a 53 y.o. female presenting with shortness of breath intermittently over the past week, worsened with laying down requiring extra pillows and exertion.  She endorses some weight gain which she notices in her face and belly.  Since last night she has been having a sharp left-sided anterior chest pain that radiates to her bellybutton.  Reports using her steroid inhaler at home.  Endorses unproductive coughing, nausea, rhinorrhea, headache, and dizziness when she gets up to walk.  Denies fevers, chills, vomiting, diarrhea, sick contacts, weakness in lower extremities, or issues with urination (dysuria, urgency, or frequency).  Not on home oxygen .  Previously been admitted for acute on chronic systolic congestive heart failure exacerbation in February 2025.  Patient was not deemed a good candidate for advanced therapies due to her MR and felt to have extremely poor prognosis.  She was discharged home to hospice.  Patient was on hospice for 3 months but then was discharged from hospice.  She was discharged on midodrine , Jardiance , and Bumex .  Patient has been on Percocet for her chronic pain syndrome and supportive care, with follow-ups with hospice.   In the ED, patient was worked up for ACS with the downtrending trops (50> 47).  EKG with NSR and no acute ST elevation.  Placed on [place] and satting 97 to 100%.  CXR demonstrates cardiomegaly and possible developing infiltrate versus atypical infection in RLL.  Patient was treated with CTX and azithromycin 500 mg.  Pain was moderated with Percocet 2 tablets, oxycodone  5 mg, lorazepam  1 mg, and morphine  2 mg.  Review Of Systems: Per HPI with the following  additions:   Pertinent Past Medical History: COPD HFrEF secondary to severe MR (not amenable to repair procedure) Hypotension  Hyperthyroidism (9 months ago, normal TSH 5 months ago) CKD stage III T2DM on insulin  Bipolar affective disorder Depression/anxiety Asthma  Remainder reviewed in history tab.   Pertinent Past Surgical History: Knee arthroscopy Tubal ligation  Remainder reviewed in history tab.   Pertinent Social History: Tobacco use: Yes, smokes 1/4 of pack a day, since age of 53 yo Alcohol use: None Other Substance use: None Lives with Augustin Leber, unhoused  Pertinent Family History: Mother: Deceased; bipolar disorder Father: Deceased; HTN, T2DM  Remainder reviewed in history tab.   Important Outpatient Medications: Albuterol  every 6 hours as needed Abilify  5 mg daily (not taking) Bumex  1 mg daily Atarax  50 mg 3 times daily as needed (not taking) Insulin  glargine 10 units at bedtime.  Take 15 units for 2 days while on prednisone . DuoNeb 3 mL by neb every 6 hours as needed Metformin  1000 twice daily Midodrine  10 mg 3 times daily (taking once daily) Dulera  2 puffs twice daily Percocet 10-325 mg every 6 hours as needed for pain (not taking) Oxycodone  15 mg q6h Phenergan  25 mg every 6 hours as needed for nausea or vomiting (taking BID prn) Crestor  20 mg daily Jardiance  10 mg daily  *last took all meds yesterday  Remainder reviewed in medication history.   Objective: BP 110/81  Pulse (!) 103   Temp 99.1 F (37.3 C) (Oral)   Resp (!) 21   Ht 5\' 4"  (1.626 m)   Wt 65.8 kg   LMP  (LMP Unknown) Comment: Pt is poor historian  SpO2 97%   BMI 24.89 kg/m  Exam: General: Awake and Alert, conversational dyspnea HEENT: NCAT. Sclera anicteric. No rhinorrhea.  Elevated JVP. Cardiovascular: Tachycardic.  Murmur noted at LLSB Respiratory: Tachypneic on RA.  Mild b/l peripheral crackles.  No wheezing. Abdomen: Soft, non-tender, mild distention. Bowel sounds  normoactive Extremities: Able to move all extremities. No BLE edema, no deformities or significant joint findings. Skin: Warm and dry. No abrasions or rashes noted. Neuro: A&Ox3. No focal neurological deficits.  Labs:  CBC BMET  Recent Labs  Lab 11/14/23 0639  WBC 7.0  HGB 11.6*  HCT 36.3  PLT 172   Recent Labs  Lab 11/14/23 0639  NA 139  K 2.8*  CL 99  CO2 24  BUN 17  CREATININE 1.12*  GLUCOSE 244*  CALCIUM  8.6*    AG: 16 BNP: 2090.8 Trop: 50 > 47 Albumin : 3.4 Bili: 1.4  EKG:  My own interpretation: Regular rate and rhythm.  Prolonged QTc (574).  LVH.  Biatrial enlargement.  No ST elevation.  Imaging Studies Performed: CXR: Mild hazy asymmetric increased opacities over the right lower lobe lung may reflect early infiltrate versus atypical infection.  My Interpretation: Cardiomegaly.  Hazy opacities noted in RLL.  Clyda Dark, DO 11/14/2023, 5:06 PM PGY-1, [facility] Health Family Medicine  FPTS Intern pager: [phone], text pages welcome Secure chat group [facility] Teaching Service    Upper Level Addendum: I have seen and evaluated this patient along with Dr. Randeen Busman and reviewed the above note, making necessary revisions as appropriate. I agree with the medical decision making and physical exam as noted above. Wilhemena Harbour, MD PGY-3 [facility] Family Medicine Residency

## 2023-11-14 NOTE — ED Notes (Signed)
 BNP and Hepatic panel added on by [facility] in lab.

## 2023-11-14 NOTE — Assessment & Plan Note (Addendum)
 Palliative care saw patient in the ED and she would like to continue home with hospice care after this admission. - Palliative care consulted, appreciate recommendations and discussion of GOC

## 2023-11-14 NOTE — ED Notes (Signed)
 Pt ambulated to restroom without assistance.

## 2023-11-14 NOTE — Consult Note (Signed)
 Palliative Care Consult Note                                  Date: 11/14/2023   Patient Name: [name] [name]  DOB: 10-27-1970  MRN: [id number]  Age / Sex: 53 y.o., female  PCP: [facility] Referring Physician: Auston Blush, MD  Reason for Consultation: Establishing goals of care  HPI/Patient Profile: 53 y.o. female  with past medical history of underlying bipolar disorder, anxiety disorder, ongoing tobacco use and underlying COPD, hypertension, HFrEF secondary to severe mitral regurgitation that is not amenable to repair procedure, hypothyroidism, CKD stage IIIb admitted on 11/14/2023 with chest pain and shortness of breath.  She was discharged from home hospice 11/09/23.    Past Medical History:  Diagnosis Date   Anxiety disorder    Asthma    Bipolar affective disorder (HCC)    Blood transfusion without reported diagnosis    CHF (congestive heart failure) (HCC)    COPD (chronic obstructive pulmonary disease) (HCC)    Depression    Diabetes mellitus    Migraine     Subjective:   I have reviewed medical records including EPIC notes, labs and imaging, received updates from nursing, assessed the patient and then met with the patient to discuss diagnosis prognosis, GOC, EOL wishes, disposition and options.  I introduced Palliative Medicine as specialized medical care for people living with serious illness. It focuses on providing relief from symptoms and stress of a serious illness. The goal is to improve quality of life for both the patient and the family.  Today's Discussion: Met with patient in emergency room.  We discussed her chronic conditions including her heart failure and severe mitral regurgitation. She understands her poor prognosis. After her last admission in March 2025 she was discharged home with [facility] hospice. She reports there was a time in May where she almost came to the hospital for worsening breathing  but she was afraid of losing losing hospice coverage and did not come to the hospital. Patient shared [facility] discharged her from hospice on 11/09/2023.  She reports that prior to admission she had had approximately 10 days of difficulty breathing. She felt as though the diuretic they had her on was not pulling off enough fluid. She no longer had hospice services so she called EMS when she became symptomatic.  Confirmed DNR/DNI status.  Patient shared her goal is to be admitted, optimize her health, and discharge home with hospice. She would like to try a different home hospice service. She reports that overall having hospice services was a benefit. Hospice was visiting her about once a week and managing symptoms.    Discussed the importance of continued conversation with family and the medical providers regarding overall plan of care and treatment options, ensuring decisions are within the context of the patient's values and GOCs.  Questions and concerns were addressed. The patient was encouraged to call with questions or concerns. PMT will continue to support holistically. Attending team came in to complete admission-- patient agrees to meet again tomorrow.  Review of Systems  Constitutional:  Positive for fatigue.  Respiratory:  Positive for chest tightness and shortness of breath.   Cardiovascular:        Generalized edema    Objective:   Primary Diagnoses: Present on Admission: **None**   Physical Exam Vitals reviewed.  Constitutional:      General: She is not  in acute distress. HENT:     Head: Normocephalic and atraumatic.  Cardiovascular:     Rate and Rhythm: Normal rate.  Pulmonary:     Effort: Tachypnea present.  Skin:    General: Skin is warm and dry.  Neurological:     Mental Status: She is alert and oriented to person, place, and time.  Psychiatric:        Mood and Affect: Mood normal.        Behavior: Behavior normal.     Vital Signs:  BP 94/64   Pulse  100   Temp 98.2 F (36.8 C) (Oral)   Resp (!) 28   Ht 5\' 4"  (1.626 m)   Wt 65.8 kg   LMP  (LMP Unknown) Comment: Pt is poor historian  SpO2 97%   BMI 24.89 kg/m     Advanced Care Planning:   Existing Vynca/ACP Documentation: None  Primary Decision Maker: PATIENT  Code Status/Advance Care Planning: DNR   Assessment & Plan:   SUMMARY OF RECOMMENDATIONS   DNR/DNI Optimize health for discharge with home hospice TOC- Home hospice PMT will continue to follow   Discussed with: nursing and admitting team  Time Total: 80 minutes    Thank you for allowing us  to participate in the care of [name] [name] PMT will continue to support holistically.  Signed by: Joaquim Muir, NP Palliative Medicine Team  Team Phone # [phone] (Nights/Weekends)  11/14/2023, 1:23 PM

## 2023-11-14 NOTE — Assessment & Plan Note (Addendum)
 K of 2.8 in the ED.  Repleted with 80 mEq. - Klor-Con  40 mEq with Lasix  - BMP at 7 PM - Monitor with AM BMP

## 2023-11-14 NOTE — ED Triage Notes (Signed)
 Patient BIB [facility] from home due to shortness of breath. Patient states she has progressively feeling worse for about a week. Patient complains of left sided chest pain, sharp, and radiates to abdomen. Patient was given 2 duonebs, 125 solumedrol, and 2g of magnesium  en route. Patient states she has a hx of COPD and CHF. Patient is A&Ox4.

## 2023-11-14 NOTE — Assessment & Plan Note (Addendum)
 Most likely secondary to acute on chronic congestive heart failure with presenting symptoms (dyspnea, orthopnea, mild abdominal distention), BNP elevated to 2,090, and elevated JVP on exam.  Has been previously admitted for CHF exacerbation, most recently in February 2025.  Echo at the time demonstrated EF of 20 to 25%, global hypokinesis, mild LVH, severe MVR, IVC with > 50% respiratory variability.  Plan to diurese as able while monitoring BP.  Appreciate cardiology recommendations. - Admit to FMTS, attending to [facility] - Med-tele, Vital signs per floor - Regular diet  - VTE prophylaxis: Lovenox  - IV Lasix  40 mg - Holding home meds of Bumex  and Jardiance , pending cards recs - Procalcitonin ordered (0.16), low indication to continue to abx - D-dimer elevated (2.96), ordered CTPE to rule out PE - AM BMP and Mag - Electrolyte goals: K>4, Mag>2 - Daily weights; unsure of dry weight - Strict I/Os - Cardiac monitoring  - Cardiology consulted, appreciate recommendations

## 2023-11-14 NOTE — ED Notes (Signed)
 Spoke with Family Medicine concerning pt's BS being greater than 600, per admitting team, hold sliding scale and they will take a look and put in appropriate orders. Pt will most likely be started on Endo tool with an insulin  drip. Primary nurse was notified as well and we will continue to monitor.

## 2023-11-14 NOTE — Assessment & Plan Note (Addendum)
 QTc prolonged to 574.  - Avoid QT prolonging meds - Repeat EKG

## 2023-11-14 NOTE — Consult Note (Addendum)
 Cardiology Consultation   Patient ID: [name] [name] MRN: [id number]; DOB: [birth year]  Admit date: 11/14/2023 Date of Consult: 11/14/2023  PCP:  [facility]    HeartCare Providers Cardiologist:  Sheryle Donning, MD     Patient Profile: [name] [name] is a 53 y.o. female with a hx of chronic systolic HF, COPD, tobacco use, DM, Bipolar disorder and severe mitral valve regurgitation who is being seen 11/14/2023 for the evaluation of CHF/MR at the request of Dr. Synetta Eves.  History of Present Illness: [name] [name] is a 53 yo female with PMH noted above. She has previously been seen by the advanced HF team at [facility] and [facility].  Previously followed by advanced heart failure team at [facility] where she was deemed not to be a candidate for advanced therapy secondary to ongoing smoking, psychosocial barriers, schizoaffective disorder and chronic pain disorder.  Last seen by cardiology 07/2023 with complaints of heart failure.  Limited echo during that admission with LVEF of 20 to 25%, global hypokinesis, moderately reduced RV, severe MR. Evaluated by palliative care recommendations to continue home hospice.  She was discharged on Jardiance , midodrine  and Bumex .  Presented to the ED on 6/6 with complaints of worsening shortness of breath.  States worsening symptoms for about a week with increased weight gain, orthopnea and lower extremity edema.  Reports that she has been taking her Bumex  daily and actually doubled up on the dose a few days this past week without significant improvement.  Also complains of sharp centralized chest pain worse with inspiration.  She is currently living with a friend.   Sodium 139, potassium 2.8, creatinine 1.12, albumin  3.4, BNP 2090, high-sensitivity troponin 50>>47, WBC 7, hemoglobin 11.6.  EKG showed sinus rhythm, 82 bpm, atrial enlargement.  Chest x-ray with mild hazy asymmetric increased opacities over the right lower lobe.  She was  started on IV Lasix  and admitted to internal medicine for further management.  Cardiology asked to evaluate.    Past Medical History:  Diagnosis Date   Anxiety disorder    Asthma    Bipolar affective disorder (HCC)    Blood transfusion without reported diagnosis    CHF (congestive heart failure) (HCC)    COPD (chronic obstructive pulmonary disease) (HCC)    Depression    Diabetes mellitus    Migraine     Past Surgical History:  Procedure Laterality Date   KNEE ARTHROSCOPY     x 2   KNEE SURGERY Left    Incision made and knee cleaned out   TUBAL LIGATION         Scheduled Meds:  Continuous Infusions:  PRN Meds:   Allergies:    Allergies  Allergen Reactions   Tramadol  Nausea Only and Rash   Wellbutrin [Bupropion] Other (See Comments)    Behavioral changes (aggression, irritability)   Benadryl  [Diphenhydramine ] Other (See Comments)    Decreases motor skills and increases anxiety   Lactose Intolerance (Gi) Diarrhea   Nicotine  Rash    Patient states she is allergic to the Nicotine  patch and they cause her to break out in a rash.  She states she can smoke cigarettes.    Social History:   Social History   Socioeconomic History   Marital status: Divorced    Spouse name: Not on file   Number of children: Not on file   Years of education: Not on file   Highest education level: Not on file  Occupational History   Not on  file  Tobacco Use   Smoking status: Every Day    Current packs/day: 1.00    Average packs/day: 1 pack/day for 29.0 years (29.0 ttl pk-yrs)    Types: Cigarettes   Smokeless tobacco: Never  Vaping Use   Vaping status: Former  Substance and Sexual Activity   Alcohol use: Not Currently    Comment: occasionally   Drug use: No   Sexual activity: Yes    Birth control/protection: Surgical  Other Topics Concern   Not on file  Social History Narrative   Patient lives in [place] with her 2 kids- 56 in 2 years old.   She has total 4 kids.    She is a single mom.         Social Drivers of Corporate investment banker Strain: High Risk (09/30/2022)   Overall Financial Resource Strain (CARDIA)    Difficulty of Paying Living Expenses: Hard  Food Insecurity: No Food Insecurity (08/06/2023)   Hunger Vital Sign    Worried About Running Out of Food in the Last Year: Never true    Ran Out of Food in the Last Year: Never true  Recent Concern: Food Insecurity - Food Insecurity Present (07/06/2023)   Hunger Vital Sign    Worried About Running Out of Food in the Last Year: Never true    Ran Out of Food in the Last Year: Sometimes true  Transportation Needs: No Transportation Needs (08/06/2023)   PRAPARE - Administrator, Civil Service (Medical): No    Lack of Transportation (Non-Medical): No  Physical Activity: Not on file  Stress: No Stress Concern Present (01/07/2022)   Received from [facility] of Occupational Health - Occupational Stress Questionnaire    Feeling of Stress : Not at all  Social Connections: Moderately Isolated (08/06/2023)   Social Connection and Isolation Panel [NHANES]    Frequency of Communication with Friends and Family: More than three times a week    Frequency of Social Gatherings with Friends and Family: More than three times a week    Attends Religious Services: More than 4 times per year    Active Member of [employer] or Organizations: No    Attends Banker Meetings: Never    Marital Status: Divorced  Catering manager Violence: Not At Risk (08/06/2023)   Humiliation, Afraid, Rape, and Kick questionnaire    Fear of Current or Ex-Partner: No    Emotionally Abused: No    Physically Abused: No    Sexually Abused: No    Family History:    Family History  Problem Relation Age of Onset   Bipolar disorder Mother    Hypertension Father    Diabetes Father      ROS:  Please see the history of present illness.   All other ROS reviewed and negative.      Physical Exam/Data: Vitals:   11/14/23 1022 11/14/23 1100 11/14/23 1200 11/14/23 1300  BP: 113/73 109/72 94/64 (!) 91/44  Pulse: 94 95 100 (!) 115  Resp: (!) 21 (!) 21 (!) 28 (!) 27  Temp: 98.2 F (36.8 C)     TempSrc: Oral     SpO2: 97% 96% 97% 97%  Weight:      Height:        Intake/Output Summary (Last 24 hours) at 11/14/2023 1404 Last data filed at 11/14/2023 1220 Gross per 24 hour  Intake 0.21 ml  Output --  Net 0.21 ml  11/14/2023    6:34 AM 08/11/2023    4:35 AM 08/10/2023    3:17 AM  Last 3 Weights  Weight (lbs) 145 lb 159 lb 2.8 oz 158 lb 1.6 oz  Weight (kg) 65.772 kg 72.2 kg 71.714 kg     Body mass index is 24.89 kg/m.  General: Caucasian female appears older than stated age, in no acute distress.  On nasal cannula at 2 L HEENT: normal Neck: no JVD Vascular: No carotid bruits; Distal pulses 2+ bilaterally Cardiac:  normal S1, S2; RRR; +2/6 systolic murmur LUSB Lungs:  clear to auscultation bilaterally, no wheezing, rhonchi or rales  Abd: soft, nontender, no hepatomegaly  Ext: no edema Musculoskeletal:  No deformities, BUE and BLE strength normal and equal Skin: warm and dry  Neuro: no focal abnormalities noted Psych:  Normal affect   EKG:  The EKG was personally reviewed and demonstrates:  sinus rhythm, 82 bpm, atrial enlargement Telemetry:  Telemetry was personally reviewed and demonstrates: Sinus rhythm  Relevant CV Studies:  Echo: 07/2023  IMPRESSIONS     1. Left ventricular ejection fraction, by estimation, is 20 to 25%. The  left ventricle has severely decreased function. The left ventricle  demonstrates global hypokinesis. The left ventricular internal cavity size  was severely dilated. There is mild  left ventricular hypertrophy.   2. Right ventricular systolic function is moderately reduced. The right  ventricular size is normal.   3. The mitral valve is normal in structure. Severe mitral valve  regurgitation. No evidence of mitral  stenosis.   4. The aortic valve is tricuspid. Aortic valve regurgitation is trivial.  No aortic stenosis is present.   5. The inferior vena cava is normal in size with greater than 50%  respiratory variability, suggesting right atrial pressure of 3 mmHg.   FINDINGS   Left Ventricle: Left ventricular ejection fraction, by estimation, is 20  to 25%. The left ventricle has severely decreased function. The left  ventricle demonstrates global hypokinesis. Definity  contrast agent was  given IV to delineate the left  ventricular endocardial borders. The left ventricular internal cavity size  was severely dilated. There is mild left ventricular hypertrophy.   Right Ventricle: The right ventricular size is normal. Right ventricular  systolic function is moderately reduced.   Left Atrium: Left atrial size was normal in size.   Right Atrium: Right atrial size was normal in size.   Pericardium: There is no evidence of pericardial effusion.   Mitral Valve: The mitral valve is normal in structure. Severe mitral valve  regurgitation. No evidence of mitral valve stenosis.   Tricuspid Valve: The tricuspid valve is normal in structure. Tricuspid  valve regurgitation is mild . No evidence of tricuspid stenosis.   Aortic Valve: The aortic valve is tricuspid. Aortic valve regurgitation is  trivial. No aortic stenosis is present.   Pulmonic Valve: The pulmonic valve was normal in structure. Pulmonic valve  regurgitation is mild. No evidence of pulmonic stenosis.   Aorta: The aortic root is normal in size and structure.   Venous: The inferior vena cava is normal in size with greater than 50%  respiratory variability, suggesting right atrial pressure of 3 mmHg.   IAS/Shunts: There is right bowing of the interatrial septum, suggestive of  elevated left atrial pressure.   Laboratory Data: High Sensitivity Troponin:   Recent Labs  Lab 11/14/23 0639 11/14/23 0840  TROPONINIHS 50* 47*      Chemistry Recent Labs  Lab 11/14/23 0639  NA 139  K 2.8*  CL 99  CO2 24  GLUCOSE 244*  BUN 17  CREATININE 1.12*  CALCIUM  8.6*  GFRNONAA 58*  ANIONGAP 16*    Recent Labs  Lab 11/14/23 0639  PROT 6.1*  ALBUMIN  3.4*  AST 28  ALT 16  ALKPHOS 79  BILITOT 1.4*   Lipids No results for input(s): "CHOL", "TRIG", "HDL", "LABVLDL", "LDLCALC", "CHOLHDL" in the last 168 hours.  Hematology Recent Labs  Lab 11/14/23 0639  WBC 7.0  RBC 4.22  HGB 11.6*  HCT 36.3  MCV 86.0  MCH 27.5  MCHC 32.0  RDW 18.0*  PLT 172   Thyroid  No results for input(s): "TSH", "FREET4" in the last 168 hours.  BNP Recent Labs  Lab 11/14/23 0639  BNP 2,090.8*    DDimer No results for input(s): "DDIMER" in the last 168 hours.  Radiology/Studies:  DG Chest Portable 1 View Result Date: 11/14/2023 CLINICAL DATA:  Chest pain and shortness of breath. EXAM: PORTABLE CHEST 1 VIEW COMPARISON:  08/06/2023 FINDINGS: Stable cardiac enlargement. No significant scratch set no pleural effusion or pneumothorax. Mild hazy asymmetric increase opacities over the right lower lung noted. Left lung appears clear. Visualized osseous structures are unremarkable. IMPRESSION: Mild hazy asymmetric increase opacities over the right lower lung may reflect early infiltrate versus atypical infection. Electronically Signed   By: Kimberley Penman M.D.   On: 11/14/2023 07:17     Assessment and Plan:  [name] [name] is a 53 y.o. female with a hx of chronic systolic HF, COPD, tobacco use, DM, Bipolar disorder and severe mitral valve regurgitation who is being seen 11/14/2023 for the evaluation of CHF/MR at the request of Dr. Synetta Eves.   Acute on chronic HFrEF -- Echo 07/2023 with known LVEF of 20 to 25%, global hypokinesis, moderately reduced RV  -- presents with worsening shortness of breath, lower extremity edema, orthopnea over the past week despite being compliant with her Bumex .  Thinks her dry weight is around 170 though has not been  weighing herself on a regular basis at home. -- BNP 2090 (previously 4443, 3747), chest x-ray with hazy asymmetrical increased opacities over the right lower lobe concerning for fluid versus atypical pneumonia.  Suspect more likely representative of HF as she has no leukocytosis or fever. -- Does not appear volume overloaded on exam -- Ordered for IV Lasix  40 mg x1 -- GDMT limited in the setting of her low BP, she is end stage HF. Will continue jardiance  10mg  daily. On midodrine , though not ideal with HF, options are limited.  As previously noted she has been followed by hospice as an outpatient, not a candidate for advanced therapies.  Prolonged QTc -- 491 on EKG this morning, avoid QT prolonging medications   COPD Tobacco use  Risk Assessment/Risk Scores:      New [place]  Heart Association (NYHA) Functional Class NYHA Class III       For questions or updates, please contact [facility] Please consult [email or web address] for contact info under    Signed, Johnie Nailer, NP  11/14/2023 2:04 PM

## 2023-11-14 NOTE — ED Notes (Signed)
 Pt states pain is worse and radiating down into her abdomen. Pt also states she is nauseous, MD messaged via secure chat

## 2023-11-14 NOTE — ED Notes (Signed)
 Pt request meds for nausea.  Secure message sent to Dr Bevin Bucks.

## 2023-11-15 DIAGNOSIS — I5023 Acute on chronic systolic (congestive) heart failure: Secondary | ICD-10-CM

## 2023-11-15 LAB — BASIC METABOLIC PANEL WITH GFR
Anion gap: 10 (ref 5–15)
BUN: 19 mg/dL (ref 6–20)
CO2: 25 mmol/L (ref 22–32)
Calcium: 8.1 mg/dL — ABNORMAL LOW (ref 8.9–10.3)
Chloride: 98 mmol/L (ref 98–111)
Creatinine, Ser: 1.15 mg/dL — ABNORMAL HIGH (ref 0.44–1.00)
GFR, Estimated: 57 mL/min — ABNORMAL LOW (ref >60)
Glucose, Bld: 148 mg/dL — ABNORMAL HIGH (ref 70–99)
Potassium: 3.8 mmol/L (ref 3.5–5.1)
Sodium: 133 mmol/L — ABNORMAL LOW (ref 135–145)

## 2023-11-15 LAB — GLUCOSE, CAPILLARY
Glucose-Capillary: 291 mg/dL — ABNORMAL HIGH (ref 70–99)
Glucose-Capillary: 200 mg/dL — ABNORMAL HIGH (ref 70–99)
Glucose-Capillary: 177 mg/dL — ABNORMAL HIGH (ref 70–99)

## 2023-11-15 LAB — CBG MONITORING, ED
Glucose-Capillary: 329 mg/dL — ABNORMAL HIGH (ref 70–99)
Glucose-Capillary: 504 mg/dL (ref 70–99)
Glucose-Capillary: 580 mg/dL (ref 70–99)
Glucose-Capillary: 158 mg/dL — ABNORMAL HIGH (ref 70–99)
Glucose-Capillary: 110 mg/dL — ABNORMAL HIGH (ref 70–99)
Glucose-Capillary: 113 mg/dL — ABNORMAL HIGH (ref 70–99)
Glucose-Capillary: 93 mg/dL (ref 70–99)
Glucose-Capillary: 211 mg/dL — ABNORMAL HIGH (ref 70–99)

## 2023-11-15 LAB — TSH: TSH: 0.64 u[IU]/mL (ref 0.350–4.500)

## 2023-11-15 LAB — MAGNESIUM: Magnesium: 2 mg/dL (ref 1.7–2.4)

## 2023-11-15 MED ORDER — OXYCODONE HCL 5 MG PO TABS
10.0000 mg | ORAL_TABLET | Freq: Three times a day (TID) | ORAL | Status: DC | PRN
  Administered 2023-11-15 – 2023-11-16 (×2): 10 mg via ORAL
  Filled 2023-11-15 (×2): qty 2

## 2023-11-15 MED ORDER — INSULIN GLARGINE-YFGN 100 UNIT/ML ~~LOC~~ SOLN
8.0000 [IU] | Freq: Every day | SUBCUTANEOUS | Status: DC
  Administered 2023-11-16: 8 [IU] via SUBCUTANEOUS
  Filled 2023-11-15: qty 0.08

## 2023-11-15 MED ORDER — IPRATROPIUM-ALBUTEROL 0.5-2.5 (3) MG/3ML IN SOLN: 3.0000 mL | Freq: Three times a day (TID) | RESPIRATORY_TRACT | Status: DC

## 2023-11-15 MED ORDER — POTASSIUM CHLORIDE 20 MEQ PO PACK
40.0000 meq | PACK | Freq: Once | ORAL | Status: AC
  Administered 2023-11-15: 40 meq via ORAL
  Filled 2023-11-15: qty 2

## 2023-11-15 MED ORDER — METFORMIN HCL 500 MG PO TABS: 1000.0000 mg | ORAL_TABLET | Freq: Two times a day (BID) | ORAL | Status: DC

## 2023-11-15 MED ORDER — INSULIN ASPART 100 UNIT/ML IJ SOLN
0.0000 [IU] | Freq: Three times a day (TID) | INTRAMUSCULAR | Status: DC
  Administered 2023-11-15: 5 [IU] via SUBCUTANEOUS
  Administered 2023-11-15: 2 [IU] via SUBCUTANEOUS
  Administered 2023-11-16: 7 [IU] via SUBCUTANEOUS

## 2023-11-15 MED ORDER — PROCHLORPERAZINE EDISYLATE 10 MG/2ML IJ SOLN
10.0000 mg | Freq: Four times a day (QID) | INTRAMUSCULAR | Status: AC | PRN
  Administered 2023-11-15: 10 mg via INTRAVENOUS
  Filled 2023-11-15: qty 2

## 2023-11-15 MED ORDER — CALCIUM CARBONATE ANTACID 500 MG PO CHEW
1.0000 | CHEWABLE_TABLET | Freq: Two times a day (BID) | ORAL | Status: AC
  Administered 2023-11-15 (×2): 200 mg via ORAL
  Filled 2023-11-15 (×2): qty 1

## 2023-11-15 MED ORDER — EMPAGLIFLOZIN 10 MG PO TABS
10.0000 mg | ORAL_TABLET | Freq: Every day | ORAL | Status: DC
  Administered 2023-11-15: 10 mg via ORAL
  Filled 2023-11-15: qty 1

## 2023-11-15 MED ORDER — INSULIN ASPART 100 UNIT/ML IJ SOLN: 0.0000 [IU] | Freq: Every day | INTRAMUSCULAR | Status: DC

## 2023-11-15 MED ORDER — TORSEMIDE 20 MG PO TABS
20.0000 mg | ORAL_TABLET | Freq: Every day | ORAL | Status: DC
  Administered 2023-11-15 – 2023-11-18 (×4): 20 mg via ORAL
  Filled 2023-11-15 (×4): qty 1

## 2023-11-15 MED ORDER — ACETAMINOPHEN 325 MG PO TABS
650.0000 mg | ORAL_TABLET | Freq: Four times a day (QID) | ORAL | Status: DC
  Administered 2023-11-15 – 2023-11-25 (×32): 650 mg via ORAL
  Filled 2023-11-15 (×33): qty 2

## 2023-11-15 MED ORDER — INSULIN GLARGINE-YFGN 100 UNIT/ML ~~LOC~~ SOLN
5.0000 [IU] | SUBCUTANEOUS | Status: AC
  Administered 2023-11-15: 5 [IU] via SUBCUTANEOUS
  Filled 2023-11-15: qty 0.05

## 2023-11-15 NOTE — ED Notes (Signed)
 This RN reached out to admitting MD regarding pt blood sugar being 93. MD stated they are discontinuing the insulin  drip and that pt could eat now.

## 2023-11-15 NOTE — Assessment & Plan Note (Addendum)
 Palliative care saw patient in the ED and she would like to continue home with hospice care after this admission. - Palliative care consulted, appreciate recommendations and discussion of GOC - Transition to oral opioids for chronic pain  - Scheduled Tylenol  650 mg QID and Oxycodone  10 mg TID PRN to start

## 2023-11-15 NOTE — Assessment & Plan Note (Addendum)
 QTc prolonged to 574.  - Avoid QT prolonging meds - Repeat EKG

## 2023-11-15 NOTE — Assessment & Plan Note (Addendum)
 Most likely secondary to congestive heart failure  and MR given presenting symptoms  (dyspnea, orthopnea, mild abdominal distention), BNP elevated to 2,090, though lower than previous admits, most recently in February 2025,  EF of 20 to 25%, global hypokinesis, mild LVH, severe MVR, IVC with > 50% respiratory variability.  Plan to restart home medications per cardiology recommendations. Procalcitonin 0.16 recommend d/c abx. CTA PE negative.  - Med-tele, Vital signs per floor - Regular diet  - Patient does not wish to restart Bumex --will use torsemide 20 mg  - Jardiance  10 mg daily - AM BMP and Mag - Electrolyte goals: K>4, Mag>2 - Daily weights; unsure of dry weight - Strict I/Os - Cardiac monitoring  - Cardiology consulted, s/o

## 2023-11-15 NOTE — Assessment & Plan Note (Addendum)
 COPD/Asthma: Continuing home Dulera  with formulary alternative (Breo Ellipta ) and albuterol  2.5 mg as needed.   Encourage tobacco cessation. Anemia of chronic disease: Hgb 11.6, will monitor T2DM: Sensitive SSI, monitor CBGs qACHS. Hyperglycemic to 600 yesterday, started on Insulin  gtt w/ endotool, transitioned to sub q insulin  this morning.   Chronic Pain Syndrome: Low back and neck pain. On Oxycodone  5 mg q6h at home. Will transition off of dilaudid  to Oral oxycodone . Oxycodone  10 mg TID PRN, and Tylenol  scheduled.  Smoking Hx: Smoked 1/4 ppd, and has been smoking since 53 yo. Started Nicorette  gum since she has had a reaction to the patch before HLD: On Crestor  20 mg daily; unclear if she takes this  Bipolar disorder/anxiety/depression: Patient not taking Abilify , Atarax , or sertraline  as listed in her home medications.

## 2023-11-15 NOTE — Progress Notes (Addendum)
 Daily Progress Note Intern Pager: [phone]  Patient name: [name] [name] Medical record number: [id number] Date of birth: [birth year] Age: 53 y.o. Gender: female  Primary Care Provider: [facility] Consultants: Cardiology s/o, Palliative care Code Status: DNR/DNI  Pt Overview and Major Events to Date:  6/6 - Admitted  Assessment and Plan:  [name] [name] is a 53 y.o. female with PMH of HFrEF, severe MR, COPD, and bipolar 1 disorder presenting with dyspnea and chest pain found to be in chronic/terminal HF.  Assessment & Plan Acute exacerbation of congestive heart failure (HCC) Most likely secondary to congestive heart failure  and MR given presenting symptoms  (dyspnea, orthopnea, mild abdominal distention), BNP elevated to 2,090, though lower than previous admits, most recently in February 2025,  EF of 20 to 25%, global hypokinesis, mild LVH, severe MVR, IVC with > 50% respiratory variability.  Plan to restart home medications per cardiology recommendations. Procalcitonin 0.16 recommend d/c abx. CTA PE negative.  - Med-tele, Vital signs per floor - Regular diet  - Patient does not wish to restart Bumex --will use torsemide 20 mg  - Jardiance  10 mg daily - AM BMP and Mag - Electrolyte goals: K>4, Mag>2 - Daily weights; unsure of dry weight - Strict I/Os - Cardiac monitoring  - Cardiology consulted, s/o Hypotension BP 80-110s/44-70s. - Midodrine  10 mg TID Prolonged QT interval QTc prolonged to 574.  - Avoid QT prolonging meds - Repeat EKG Goals of care, counseling/discussion Palliative care saw patient in the ED and she would like to continue home with hospice care after this admission. - Palliative care consulted, appreciate recommendations and discussion of GOC - Transition to oral opioids for chronic pain  - Scheduled Tylenol  650 mg QID and Oxycodone  10 mg TID PRN to start  Chronic health problem COPD/Asthma: Continuing home Dulera  with formulary  alternative (Breo Ellipta ) and albuterol  2.5 mg as needed.   Encourage tobacco cessation. Anemia of chronic disease: Hgb 11.6, will monitor T2DM: Sensitive SSI, monitor CBGs qACHS. Hyperglycemic to 600 yesterday, started on Insulin  gtt w/ endotool, transitioned to sub q insulin  this morning.   Chronic Pain Syndrome: Low back and neck pain. On Oxycodone  5 mg q6h at home. Will transition off of dilaudid  to Oral oxycodone . Oxycodone  10 mg TID PRN, and Tylenol  scheduled.  Smoking Hx: Smoked 1/4 ppd, and has been smoking since 52 yo. Started Nicorette  gum since she has had a reaction to the patch before HLD: On Crestor  20 mg daily; unclear if she takes this  Bipolar disorder/anxiety/depression: Patient not taking Abilify , Atarax , or sertraline  as listed in her home medications.   FEN/GI: Regular Diet PPx: lovenox  Dispo:Home Pending GOC conversation.    Subjective:  Doing well this morning, but complaining of chronic pain.  Objective: Temp:  [97.6 F (36.4 C)-99.1 F (37.3 C)] 98.2 F (36.8 C) (06/07 0559) Pulse Rate:  [79-116] 91 (06/07 0917) Resp:  [14-29] 14 (06/07 0917) BP: (87-139)/(42-126) 109/82 (06/07 0917) SpO2:  [90 %-100 %] 100 % (06/07 0917) Physical Exam: General: NAD, good mood, conversant Cardiovascular: RRR, NRMG Respiratory: CTABL, good WOB Abdomen: Soft, NTTP, non-distended Extremities: Moving all extremities, no edema  Laboratory: Most recent CBC Lab Results  Component Value Date   WBC 7.0 11/14/2023   HGB 11.6 (L) 11/14/2023   HCT 36.3 11/14/2023   MCV 86.0 11/14/2023   PLT 172 11/14/2023   Most recent BMP    Latest Ref Rng & Units 11/15/2023    4:00 AM  BMP  Glucose 70 - 99 mg/dL 161   BUN 6 - 20 mg/dL 19   Creatinine 0.96 - 1.00 mg/dL 0.45   Sodium 409 - 811 mmol/L 133   Potassium 3.5 - 5.1 mmol/L 3.8   Chloride 98 - 111 mmol/L 98   CO2 22 - 32 mmol/L 25   Calcium  8.9 - 10.3 mg/dL 8.1      Wilhemena Harbour, MD 11/15/2023, 9:27 AM  PGY-3, [facility] FPTS Intern pager: [phone], text pages welcome Secure chat group [facility] Teaching Service

## 2023-11-15 NOTE — Assessment & Plan Note (Addendum)
 BP 80-110s/44-70s. - Midodrine  10 mg TID

## 2023-11-16 DIAGNOSIS — Z515 Encounter for palliative care: Secondary | ICD-10-CM | POA: Diagnosis not present

## 2023-11-16 DIAGNOSIS — Z7189 Other specified counseling: Secondary | ICD-10-CM | POA: Diagnosis not present

## 2023-11-16 DIAGNOSIS — I509 Heart failure, unspecified: Secondary | ICD-10-CM | POA: Diagnosis not present

## 2023-11-16 DIAGNOSIS — E875 Hyperkalemia: Secondary | ICD-10-CM

## 2023-11-16 DIAGNOSIS — I5023 Acute on chronic systolic (congestive) heart failure: Secondary | ICD-10-CM | POA: Diagnosis not present

## 2023-11-16 LAB — BASIC METABOLIC PANEL WITH GFR
Anion gap: 12 (ref 5–15)
Anion gap: 14 (ref 5–15)
BUN: 25 mg/dL — ABNORMAL HIGH (ref 6–20)
BUN: 25 mg/dL — ABNORMAL HIGH (ref 6–20)
CO2: 26 mmol/L (ref 22–32)
CO2: 27 mmol/L (ref 22–32)
Calcium: 8.6 mg/dL — ABNORMAL LOW (ref 8.9–10.3)
Calcium: 8.2 mg/dL — ABNORMAL LOW (ref 8.9–10.3)
Chloride: 99 mmol/L (ref 98–111)
Chloride: 96 mmol/L — ABNORMAL LOW (ref 98–111)
Creatinine, Ser: 1.65 mg/dL — ABNORMAL HIGH (ref 0.44–1.00)
Creatinine, Ser: 1.73 mg/dL — ABNORMAL HIGH (ref 0.44–1.00)
GFR, Estimated: 37 mL/min — ABNORMAL LOW (ref >60)
GFR, Estimated: 35 mL/min — ABNORMAL LOW (ref >60)
Glucose, Bld: 277 mg/dL — ABNORMAL HIGH (ref 70–99)
Glucose, Bld: 350 mg/dL — ABNORMAL HIGH (ref 70–99)
Potassium: 5.3 mmol/L — ABNORMAL HIGH (ref 3.5–5.1)
Potassium: 4.2 mmol/L (ref 3.5–5.1)
Sodium: 137 mmol/L (ref 135–145)
Sodium: 137 mmol/L (ref 135–145)

## 2023-11-16 LAB — GLUCOSE, CAPILLARY
Glucose-Capillary: 312 mg/dL — ABNORMAL HIGH (ref 70–99)
Glucose-Capillary: 230 mg/dL — ABNORMAL HIGH (ref 70–99)

## 2023-11-16 LAB — MAGNESIUM: Magnesium: 2.1 mg/dL (ref 1.7–2.4)

## 2023-11-16 MED ORDER — POLYETHYLENE GLYCOL 3350 17 G PO PACK
17.0000 g | PACK | Freq: Every day | ORAL | Status: DC
  Administered 2023-11-17 – 2023-11-19 (×2): 17 g via ORAL
  Filled 2023-11-16 (×3): qty 1

## 2023-11-16 MED ORDER — LORAZEPAM 2 MG/ML IJ SOLN
1.0000 mg | INTRAMUSCULAR | Status: DC | PRN
  Administered 2023-11-17: 1 mg via INTRAVENOUS
  Filled 2023-11-16: qty 1

## 2023-11-16 MED ORDER — ONDANSETRON 4 MG PO TBDP
4.0000 mg | ORAL_TABLET | Freq: Three times a day (TID) | ORAL | Status: DC | PRN
  Administered 2023-11-16 – 2023-11-26 (×8): 4 mg via ORAL
  Filled 2023-11-16 (×9): qty 1

## 2023-11-16 MED ORDER — HYDROMORPHONE HCL 1 MG/ML IJ SOLN
0.5000 mg | INTRAMUSCULAR | Status: DC | PRN
  Administered 2023-11-16 – 2023-11-18 (×12): 1 mg via INTRAVENOUS
  Filled 2023-11-16 (×12): qty 1

## 2023-11-16 MED ORDER — HYDROMORPHONE HCL 1 MG/ML IJ SOLN
0.5000 mg | INTRAMUSCULAR | Status: DC | PRN
  Administered 2023-11-16: 0.5 mg via INTRAVENOUS
  Filled 2023-11-16: qty 1

## 2023-11-16 MED ORDER — OXYCODONE HCL 5 MG PO TABS
15.0000 mg | ORAL_TABLET | ORAL | Status: DC | PRN
  Administered 2023-11-16 – 2023-11-18 (×7): 15 mg via ORAL
  Filled 2023-11-16 (×7): qty 3

## 2023-11-16 MED ORDER — OXYCODONE HCL 5 MG PO TABS
5.0000 mg | ORAL_TABLET | Freq: Once | ORAL | Status: AC
  Administered 2023-11-16: 5 mg via ORAL
  Filled 2023-11-16: qty 1

## 2023-11-16 MED ORDER — INSULIN ASPART 100 UNIT/ML IJ SOLN: 0.0000 [IU] | Freq: Three times a day (TID) | INTRAMUSCULAR | Status: DC

## 2023-11-16 MED ORDER — ACETAMINOPHEN 325 MG PO TABS
650.0000 mg | ORAL_TABLET | Freq: Four times a day (QID) | ORAL | Status: DC | PRN
  Filled 2023-11-16 (×2): qty 2

## 2023-11-16 MED ORDER — SENNA 8.6 MG PO TABS
1.0000 | ORAL_TABLET | Freq: Every evening | ORAL | Status: DC | PRN
  Administered 2023-11-19: 8.6 mg via ORAL
  Filled 2023-11-16: qty 1

## 2023-11-16 MED ORDER — OXYCODONE HCL 5 MG PO TABS
15.0000 mg | ORAL_TABLET | Freq: Three times a day (TID) | ORAL | Status: DC | PRN
  Administered 2023-11-16: 15 mg via ORAL
  Filled 2023-11-16: qty 3

## 2023-11-16 MED ORDER — ACETAMINOPHEN 650 MG RE SUPP: 650.0000 mg | Freq: Four times a day (QID) | RECTAL | Status: DC | PRN

## 2023-11-16 MED ORDER — LORAZEPAM 1 MG PO TABS
1.0000 mg | ORAL_TABLET | ORAL | Status: DC | PRN
  Administered 2023-11-16 – 2023-11-26 (×25): 1 mg via ORAL
  Filled 2023-11-16 (×25): qty 1

## 2023-11-16 MED ORDER — INSULIN ASPART 100 UNIT/ML IJ SOLN: 0.0000 [IU] | Freq: Every day | INTRAMUSCULAR | Status: DC

## 2023-11-16 MED ORDER — LORAZEPAM 2 MG/ML PO CONC: 1.0000 mg | ORAL | Status: DC | PRN

## 2023-11-16 MED ORDER — LIDOCAINE 5 % EX PTCH
1.0000 | MEDICATED_PATCH | CUTANEOUS | Status: DC
  Administered 2023-11-17 – 2023-11-26 (×8): 1 via TRANSDERMAL
  Filled 2023-11-16 (×10): qty 1

## 2023-11-16 NOTE — Assessment & Plan Note (Signed)
 K elevated to 5.3 today after repletion yesterday with 40 meq. - PM BMP to monitor K

## 2023-11-16 NOTE — Hospital Course (Addendum)
 [name] [name] is a 53 y.o.female with a history of HFrEF, severe MR, COPD, bipolar 1 disorder who was admitted to the [facility] Medicine Teaching Service at [facility] for acute on chronic CHF exacerbation. His hospital course is detailed below:  Acute on chronic CHF exacerbation Patient presented with dyspnea, orthopnea, and chest pain.  BNP elevated to 2090 and elevated JVD on exam.  Patient has had numerous admissions for CHF exacerbations in the past with having terminal HF since she is unable to tolerate GDMT with her hypotension.  Patient has been recently on home hospice, but was recently discharged and feels like her symptoms are uncontrolled so she was amenable to admission for symptomatic management.  Palliative care on board and has already discussed her goals which include returning home with hospice, she transitioned to comfort care during her hospitalization. Diuresed patient as able, with symptomatic improvement prior to discharge.  Pain medications titrated by palliative to establish pain regimen for home hospice.  Will continue Lasix  60 mg BID for dyspnea and comfort.   T2DM A1c 8.25 months ago.  Patient presented with AG of 16 and sugars increased to 600 on CBG.  Patient was placed on Endotool with adjustments to her fluids to be mindful of her HFrEF.  Sugars downtrended, and patient was put on Semglee  with moderate SSI and nighttime correction as needed, but this was discontinued after she became comfort care.  Hypotension Continued home Midodrine  10 mg TID.  Discontinued once comfort care.   Chronic Pain  Comfort Measures Patient reportedly on Oxycodone  15 mg q6h at home since she was on home with hospice.  Transitioned to comfort care during admission.  Pain regimen at discharge included: Tylenol  650 mg QID, Fentanyl  patch 100 mcg/hr every 72 hours, Dilaudid  4 mg q3h prn for breakthrough pain. Ativan  1 mg PO q4h prn for anxiety/sleep.  Bowel regimen includes: Miralax  17 g BID and Senna 8.6  mg BID.   Other chronic conditions were medically managed with home medications and formulary alternatives as necessary (Hypotension, COPD/Asthma, Smoking Hx, HLD, Bipolar Disorder/Anxiety/Depression)  PCP Follow-up Recommendations: Radiologist recommended repeat CT in 6 months d/t mild mediastinal and bilateral hilar adenopathy noted on CTPE, however since patient is comfort care this will have to be a further discussion. Titrate pain meds as needed.  Continue discussing goals of care.

## 2023-11-16 NOTE — Progress Notes (Signed)
 Daily Progress Note Intern Pager: [phone]  Patient name: [name] [name] Medical record number: [id number] Date of birth: 11/21/1970 Age: 53 y.o. Gender: female  Primary Care Provider: [facility] Consultants: Cardiology (signed off), Palliative Care Code Status: DNR/DNI  Pt Overview and Major Events to Date:  6/6: Admitted; started on Endotool for hyperglycemia 6/7: Transition to Semglee   Assessment and Plan: Chennel Olivos is a 53 year old female with PMH of HFrEF, severe MR, COPD, and bipolar 1 disorder who presented with dyspnea and chest pain.  Was admitted for acute on chronic CHF exacerbation with elevated BNP to 2090, tachypnea, orthopnea, and abdominal distention.  Was treated with 1 dose of ceftriaxone  and azithromycin  in the ED for possible increased opacities in the RLL on CXR; this was discontinued due to procalcitonin being negative.  D-dimer elevated, CT PE negative for PE, opacities in lower lobes and mild mediastinal and bilateral hilar adenopathy. Assessment & Plan Acute exacerbation of congestive heart failure (HCC) Most likely secondary to congestive heart failure  and MR given presenting symptoms  (dyspnea, orthopnea, mild abdominal distention), BNP elevated to 2,090, though lower than previous admits, most recently in February 2025. EF 20-25%, global hypokinesis, mild LVH, severe MVR, IVC with > 50% respiratory variability.  Not having great output with diuresis currently, will continue to monitor. - Torsemide 20 mg daily - Jardiance  10 mg daily, holding in the setting of her AKI today - AM BMP and Mag - Electrolyte goals: K>4, Mag>2 - Daily weights; unsure of dry weight - Strict I/Os, negative 250 mL yesterday - Cardiac monitoring  - Cardiology consulted, shared recommendations and signed off Hypotension BP 90-100s/50-80s. - Midodrine  10 mg TID Prolonged QT interval QTc prolonged to 574 on admission, improved to 508 on repeat. - Avoid QT  prolonging meds Type 2 diabetes mellitus with hyperglycemia (HCC) A1c 8.25 months ago.  Patient presented with AG of 16 and sugars increased to 600 on CBG.  Patient was placed on Endotool with adjustments to her fluids to be mindful of her HFrEF.  Sugars downtrended, and patient was put on Semglee  8 units daily with sensitive SSI, will modify to moderate SSI today with nighttime correction as needed. - Continue Semglee  8 units, may need to increase tomorrow - Metformin  1000 mg BID, holding w/ low GFR AKI (acute kidney injury) (HCC) Cr bumped from 1.15 to 1.65 today. - Holding nephrotoxic agents - Monitor with PM BMP Hyperkalemia K elevated to 5.3 today after repletion yesterday with 40 meq. - PM BMP to monitor K Goals of care, counseling/discussion Palliative care saw patient in the ED and she would like to continue home with hospice care after this admission. - Palliative care consulted, appreciate recommendations and discussion of GOC - Scheduled Tylenol  650 mg QID and Oxycodone  15 mg TID PRN - Lidocaine  5% patch ordered Chronic health problem COPD/Asthma: Continuing home Dulera  with formulary alternative (Breo Ellipta ) and albuterol  2.5 mg as needed.   Encourage tobacco cessation. Anemia of chronic disease: Hgb 11.6, will monitor Chronic Pain Syndrome: Low back and neck pain. On Oxycodone  15 mg q6h at home. Oxycodone  15 mg TID PRN, and Tylenol  scheduled.  Smoking Hx: Smoked 1/4 ppd, and has been smoking since 53 yo. Started Nicorette  gum since she has had a reaction to the patch before HLD: On Crestor  20 mg daily; will restart pending further palliative discussion Bipolar disorder/anxiety/depression: Patient not taking Abilify , Atarax , or sertraline  as listed in her home medications.  FEN/GI: Regular diet PPx:  Lovenox  Dispo:Home with hospice pending palliative care conversation  Subjective:  Patient is resting comfortably in bed this morning.  Continues to have some difficulty  breathing and reports still feeling overloaded.  Has not had to output much urine with her current dose of Lasix .  Reports that she has pain in her chest and back and that her dose of Oxy is not adequate for which she takes at home.  Objective: Temp:  [97.8 F (36.6 C)-98 F (36.7 C)] 97.9 F (36.6 C) (06/08 0427) Pulse Rate:  [86-97] 97 (06/08 0427) Resp:  [18] 18 (06/08 0427) BP: (90-105)/(55-81) 94/61 (06/08 0427) SpO2:  [93 %-98 %] 95 % (06/08 0427) Weight:  [64.9 kg] 64.9 kg (06/08 0427) Physical Exam: General: Awake and Alert in NAD HEENT: NCAT. Sclera anicteric. No rhinorrhea. Cardiovascular: RRR. Systolic murmur Respiratory: CTAB, mild tachypnea on RA. End expiratory wheezing noted Abdomen: Soft, non-tender, non-distended. Bowel sounds normoactive Extremities: Able to move all extremities. No BLE edema, no deformities or significant joint findings. Skin: Warm and dry. No abrasions or rashes noted. Neuro: A&Ox3. No focal neurological deficits.   Laboratory: Most recent CBC Lab Results  Component Value Date   WBC 7.0 11/14/2023   HGB 11.6 (L) 11/14/2023   HCT 36.3 11/14/2023   MCV 86.0 11/14/2023   PLT 172 11/14/2023   Most recent BMP    Latest Ref Rng & Units 11/16/2023    3:11 AM  BMP  Glucose 70 - 99 mg/dL 161   BUN 6 - 20 mg/dL 25   Creatinine 0.96 - 1.00 mg/dL 0.45   Sodium 409 - 811 mmol/L 137   Potassium 3.5 - 5.1 mmol/L 5.3   Chloride 98 - 111 mmol/L 99   CO2 22 - 32 mmol/L 26   Calcium  8.9 - 10.3 mg/dL 8.6    Mag: 2.1  Imaging/Diagnostic Tests: No new imaging.  Clyda Dark, DO 11/16/2023, 10:50 AM  PGY-1, [facility] FPTS Intern pager: [phone], text pages welcome Secure chat group [facility] Teaching Service

## 2023-11-16 NOTE — Progress Notes (Signed)
 Daily Progress Note   Patient Name: [name] [name]       Date: 11/16/2023 DOB: 1971/05/04  Age: 53 y.o. MRN#: [id number] Attending Physician: Azell Boll, MD Primary Care Physician: [facility] [place] [facility] Admit Date: 11/14/2023  Reason for Consultation/Follow-up: Establishing goals of care  Length of Stay: 2  Current Medications: Scheduled Meds:   acetaminophen   650 mg Oral Q6H   fluticasone  furoate-vilanterol  1 puff Inhalation Daily   lidocaine   1 patch Transdermal Q24H   torsemide  20 mg Oral Daily    Continuous Infusions:   PRN Meds: acetaminophen  **OR** acetaminophen , albuterol , HYDROmorphone  (DILAUDID ) injection, LORazepam  **OR** [DISCONTINUED] LORazepam  **OR** LORazepam , nicotine  polacrilex, ondansetron , oxyCODONE   Physical Exam Vitals reviewed.  Constitutional:      General: She is not in acute distress. HENT:     Head: Normocephalic and atraumatic.  Cardiovascular:     Rate and Rhythm: Normal rate.  Pulmonary:     Effort: Tachypnea present.  Skin:    General: Skin is warm and dry.  Neurological:     Mental Status: She is oriented to person, place, and time.  Psychiatric:        Behavior: Behavior normal.             Vital Signs: BP 94/61 (BP Location: Left Arm)   Pulse 97   Temp 97.9 F (36.6 C) (Oral)   Resp 18   Ht 5\' 4"  (1.626 m)   Wt 64.9 kg   LMP  (LMP Unknown) Comment: Pt is poor historian  SpO2 95%   BMI 24.56 kg/m  SpO2: SpO2: 95 % O2 Device: O2 Device: Room Air O2 Flow Rate:      Patient Active Problem List   Diagnosis Date Noted   Hyperkalemia 11/16/2023   Chronic health problem 11/14/2023   Goals of care, counseling/discussion 07/23/2023   Acute exacerbation of congestive heart failure (HCC) 07/21/2023   GAD (generalized  anxiety disorder) 07/18/2023   Acute on chronic combined systolic (congestive) and diastolic (congestive) heart failure (HCC) 07/18/2023   Bipolar 1 disorder (HCC) 07/06/2023   Chronic pain syndrome 05/25/2023   Heart failure (HCC) 05/23/2023   CHF (congestive heart failure) (HCC) 05/22/2023   Hypomagnesemia 05/22/2023   Hypotension 02/14/2023   Prolonged QT interval 09/17/2022   Severe mitral regurgitation 09/17/2022  CKD stage 3a, GFR 45-59 ml/min (HCC) 09/17/2022   Type 2 diabetes mellitus with hyperglycemia (HCC) 08/30/2022   Hyperthyroidism 08/29/2022   AKI (acute kidney injury) (HCC) 08/21/2022   COPD (chronic obstructive pulmonary disease) (HCC) 08/04/2022   Chronic systolic CHF (congestive heart failure) (HCC) 08/03/2022   Acute on chronic systolic CHF (congestive heart failure) (HCC) 05/15/2022   Overdose, intentional self-harm, initial encounter (HCC) 12/13/2018   DM2 (diabetes mellitus, type 2) (HCC) 01/03/2018   HTN (hypertension) 08/28/2015    Palliative Care Assessment & Plan   Patient Profile: 53 y.o. female  with past medical history of underlying bipolar disorder, anxiety disorder, ongoing tobacco use and underlying COPD, hypertension, HFrEF secondary to severe mitral regurgitation that is not amenable to repair procedure, hypothyroidism, CKD stage IIIb admitted on 11/14/2023 with chest pain and shortness of breath.   She was discharged from home hospice 11/09/23.   While in the ED the patient shared she would like to treat the treatable and be admitted and then return home with hospice after she was feeling better.  Today's Discussion: Reviewed chart and received update from nursing staff.  Patient lying down in bed she looks and reports she is a little short of breath.  She reports she is having severe pain in her chest radiating to her stomach.  She says that the only thing that has helped this since admission was Dilaudid .  She shared that her home hospice pain  regimen was 15 mg of oxycodone  every 4-6 hours as needed.  I tried to call and confirm this with [facility], but they were unable to provide this information today.  When I reviewed PMP aware it looks like she was receiving weekly prescriptions for less oxycodone  than she had stated. When I asked her she shared that she had also used some older prescriptions when she had pain.   Patient shared that she would like to be more comfortable with her pain management.  We discussed continuing to treat the treatable or transitioning to full comfort.  I shared that once the patient transitions to full comfort measures they would no longer receive aggressive medical interventions such as continuous vital signs, lab work, radiology testing, or medications not focused on comfort. All care would focus on how the patient is looking and feeling. This would include management of any symptoms that may cause discomfort, pain, shortness of breath, cough, nausea, agitation, anxiety, and/or secretions etc. Symptoms would be managed with medications and other non-pharmacological interventions such as spiritual support if requested, repositioning, music therapy, or therapeutic listening. Patient decided she would like to transition to full comfort measures at this time.  We discussed settings for end-of-life care and she would like to return home with hospice.  She does reiterate that she would like to use a different home hospice service than she used prior to admission.  We discussed making changes to her pain regimen.  We discussed decreasing the time between as needed oxycodone  doses and adding as needed Dilaudid .  We discussed the goal of transitioning to a longer acting opioid before discharge.  We discussed adding medications for bowel regimen as she is easily constipated with opioid use.  She understands and is agreeable to this plan.  Encourage patient to call PMT with questions or concerns.  PMT will continue to  monitor.  Recommendations/Plan: DNR Comfort measures Comfort medications per [facility] Plan to start long acting pain medications once we determine her daily OME Plan to discharge home with hospice services-  TOC order already placed    Code Status:    Code Status Orders  (From admission, onward)           Start     Ordered   11/16/23 1345  Do not attempt resuscitation (DNR) - Comfort care  Continuous       Question Answer Comment  If patient has no pulse and is not breathing Do Not Attempt Resuscitation   In Pre-Arrest Conditions (Patient Is Breathing and Has a Pulse) Provide comfort measures. Relieve any mechanical airway obstruction. Avoid transfer unless required for comfort.   Consent: Discussion documented in EHR or advanced directives reviewed      11/16/23 1348           Care plan was discussed with bedside RN and attending team  Time spent: 65 minutes  Thank you for allowing the Palliative Medicine Team to assist in the care of this patient.    Daina Drum, NP  Please contact Palliative Medicine Team phone at [phone] for questions and concerns.

## 2023-11-16 NOTE — Assessment & Plan Note (Addendum)
 COPD/Asthma: Continuing home Dulera  with formulary alternative (Breo Ellipta ) and albuterol  2.5 mg as needed.   Encourage tobacco cessation. Anemia of chronic disease: Hgb 11.6, will monitor Chronic Pain Syndrome: Low back and neck pain. On Oxycodone  15 mg q6h at home. Oxycodone  15 mg TID PRN, and Tylenol  scheduled.  Smoking Hx: Smoked 1/4 ppd, and has been smoking since 53 yo. Started Nicorette  gum since she has had a reaction to the patch before HLD: On Crestor  20 mg daily; will restart pending further palliative discussion Bipolar disorder/anxiety/depression: Patient not taking Abilify , Atarax , or sertraline  as listed in her home medications.

## 2023-11-16 NOTE — Plan of Care (Signed)
  Problem: Coping: Goal: Ability to adjust to condition or change in health will improve Outcome: Progressing   Problem: Skin Integrity: Goal: Risk for impaired skin integrity will decrease Outcome: Progressing   Problem: Tissue Perfusion: Goal: Adequacy of tissue perfusion will improve Outcome: Progressing   Problem: Clinical Measurements: Goal: Will remain free from infection Outcome: Progressing   Problem: Activity: Goal: Risk for activity intolerance will decrease Outcome: Progressing   Problem: Elimination: Goal: Will not experience complications related to urinary retention Outcome: Progressing   Problem: Safety: Goal: Ability to remain free from injury will improve Outcome: Progressing

## 2023-11-16 NOTE — Assessment & Plan Note (Addendum)
 Most likely secondary to congestive heart failure  and MR given presenting symptoms  (dyspnea, orthopnea, mild abdominal distention), BNP elevated to 2,090, though lower than previous admits, most recently in February 2025. EF 20-25%, global hypokinesis, mild LVH, severe MVR, IVC with > 50% respiratory variability.  Not having great output with diuresis currently, will continue to monitor. - Torsemide 20 mg daily - Jardiance  10 mg daily, holding in the setting of her AKI today - AM BMP and Mag - Electrolyte goals: K>4, Mag>2 - Daily weights; unsure of dry weight - Strict I/Os, negative 250 mL yesterday - Cardiac monitoring  - Cardiology consulted, shared recommendations and signed off

## 2023-11-16 NOTE — Assessment & Plan Note (Signed)
 Cr bumped from 1.15 to 1.65 today. - Holding nephrotoxic agents - Monitor with PM BMP

## 2023-11-16 NOTE — Assessment & Plan Note (Addendum)
 Palliative care saw patient in the ED and she would like to continue home with hospice care after this admission. - Palliative care consulted, appreciate recommendations and discussion of GOC - Scheduled Tylenol  650 mg QID and Oxycodone  15 mg TID PRN - Lidocaine  5% patch ordered

## 2023-11-16 NOTE — Assessment & Plan Note (Signed)
 QTc prolonged to 574 on admission, improved to 508 on repeat. - Avoid QT prolonging meds

## 2023-11-16 NOTE — Assessment & Plan Note (Addendum)
 A1c 8.25 months ago.  Patient presented with AG of 16 and sugars increased to 600 on CBG.  Patient was placed on Endotool with adjustments to her fluids to be mindful of her HFrEF.  Sugars downtrended, and patient was put on Semglee  8 units daily with sensitive SSI, will modify to moderate SSI today with nighttime correction as needed. - Continue Semglee  8 units, may need to increase tomorrow - Metformin  1000 mg BID, holding w/ low GFR

## 2023-11-16 NOTE — Assessment & Plan Note (Signed)
 BP 90-100s/50-80s. - Midodrine  10 mg TID

## 2023-11-17 DIAGNOSIS — R079 Chest pain, unspecified: Secondary | ICD-10-CM

## 2023-11-17 DIAGNOSIS — Z515 Encounter for palliative care: Secondary | ICD-10-CM | POA: Diagnosis not present

## 2023-11-17 DIAGNOSIS — Z7189 Other specified counseling: Secondary | ICD-10-CM | POA: Diagnosis not present

## 2023-11-17 DIAGNOSIS — I5023 Acute on chronic systolic (congestive) heart failure: Secondary | ICD-10-CM | POA: Diagnosis not present

## 2023-11-17 DIAGNOSIS — R0600 Dyspnea, unspecified: Secondary | ICD-10-CM | POA: Diagnosis not present

## 2023-11-17 MED ORDER — PROCHLORPERAZINE EDISYLATE 10 MG/2ML IJ SOLN
5.0000 mg | Freq: Four times a day (QID) | INTRAMUSCULAR | Status: DC | PRN
  Administered 2023-11-17 – 2023-11-21 (×6): 5 mg via INTRAVENOUS
  Filled 2023-11-17 (×7): qty 2

## 2023-11-17 MED ORDER — OXYCODONE HCL ER 15 MG PO T12A
15.0000 mg | EXTENDED_RELEASE_TABLET | Freq: Three times a day (TID) | ORAL | Status: DC
  Administered 2023-11-17 – 2023-11-18 (×4): 15 mg via ORAL
  Filled 2023-11-17 (×5): qty 1

## 2023-11-17 NOTE — Progress Notes (Signed)
 Palliative Medicine Progress Note   Patient Name: [name] [name]       Date: 11/17/2023 DOB: 03-31-1971  Age: 53 y.o. MRN#: [id number] Attending Physician: Azell Boll, MD Primary Care Physician: [facility] Admit Date: 11/14/2023   HPI/Patient Profile: 53 y.o. female  with past medical history of underlying bipolar disorder, anxiety disorder, ongoing tobacco use and underlying COPD, hypertension, HFrEF secondary to severe mitral regurgitation that is not amenable to repair procedure, hypothyroidism, CKD stage IIIb admitted on 11/14/2023 with chest pain and shortness of breath.   She was discharged from home hospice 11/09/23.    While in the ED the patient shared she would like to treat the treatable and be admitted and then return home with hospice after she was feeling better.   Subjective: Chart reviewed and updates received.  RN reports that patient has been asking for pain medication approximately every 2 hours.  Per MAR, in the past 24 hours patient has received 5.5 mg IV Dilaudid  (68.75 mg OME) and 75 mg oxycodone  IR (93.75 mg OME).  Patient assessed at bedside. She reports her current pain level as 8/10. She reports her lowest level of pain is 4/10. I discussed with patient my recommendation to start long-acting oxycodone  (oxycontin ), to hopefully allow for improved pain control and decreased use of IV dilaudid .    Plan of care discussed with primary team, RN, and TOC.    Objective:  Physical Exam Vitals reviewed.  Constitutional:      General: She is not in acute distress.    Appearance: She is ill-appearing.  Pulmonary:     Effort: No respiratory distress.  Neurological:     Mental Status: She is alert and oriented to person, place, and time.           LBM:  Last BM Date : 11/16/23 (per pt)    Palliative Medicine Assessment & Plan   Assessment: Principal Problem:   Acute exacerbation of congestive heart failure (HCC) Active Problems:   AKI (acute kidney injury) (HCC)   Type 2 diabetes mellitus with hyperglycemia (HCC)   Hypotension   Goals of care, counseling/discussion   Chronic health problem    Recommendations/Plan: Continue comfort-focused care Plan to discharge home with hospice once pain regimen is optimized PMT will follow-up tomorrow  Symptom Management: Start oxycontin   15 mg every 8 hours Continue oxycodone  IR 15 mg every 4 hours prn Continue dilaudid  IV 0.5-1 mg every 3 hours prn for severe breakthrough pain (not relieved by oxycodone  IR) Ativan  1 mg po or IV every 4 hours prn for anxiety/seizure/sleep Compazine  5 mg IV every 6 hours prn nausea Bowel regimen: Miralax  17 gm daily Senna 1 tablet at bedtime prn constipation  Code Status:DNR - comfort   Prognosis:  < 6 months  Discharge Planning: Home with Hospice    Thank you for allowing the Palliative Medicine Team to assist in the care of this patient.   MDM - High   Wynetta Heckle, NP   Please contact Palliative Medicine Team phone at [phone] for questions and concerns.  For individual providers, please see AMION.

## 2023-11-17 NOTE — Progress Notes (Signed)
 Heart Failure Navigator Progress Note  Assessed for Heart & Vascular TOC clinic readiness.  Patient does not meet criteria due to Advanced Heart Failure Team patient of Dr. Gasper Lloyd.   Navigator will sign off at this time.   Rhae Hammock, BSN, Scientist, clinical (histocompatibility and immunogenetics) Only

## 2023-11-17 NOTE — Assessment & Plan Note (Deleted)
 BP 90-100s/50-80s. - Midodrine  10 mg TID discontinued d/t comfort care

## 2023-11-17 NOTE — Assessment & Plan Note (Signed)
 Palliative discussion yesterday and patient decided she would like to pursue full comfort care.  Orders were placed and monitoring/medications were discontinued - Continue GOC discussions and appreciate palliative's assistance with finding hospice - Scheduled Tylenol  650 mg QID and Oxycodone  15 mg q4h prn - Dilaudid  0.5 to 1 mg every 3 as needed - Ativan  1 mg q4h prn - Lidocaine  5% patch ordered

## 2023-11-17 NOTE — Assessment & Plan Note (Signed)
 COPD/Asthma: Continuing home Dulera  with formulary alternative (Breo Ellipta ) and albuterol  2.5 mg as needed.   Encourage tobacco cessation. Anemia of chronic disease: Hgb 11.6. Smoking Hx: Smoked 1/4 ppd, and has been smoking since 53 yo. Started Nicorette  gum since she has had a reaction to the patch before HLD: Discontinued Crestor  20 mg daily  Bipolar disorder/anxiety/depression: Patient not taking Abilify , Atarax , or sertraline  as listed in her home medications.

## 2023-11-17 NOTE — TOC Initial Note (Signed)
 Transition of Care [facility]) - Initial/Assessment Note    Patient Details  Name: [name] [name] MRN: [id number] Date of Birth: Feb 08, 1971  Transition of Care [facility]) CM/SW Contact:    Cosimo Diones, RN Phone Number: 11/17/2023, 11:23 AM  Clinical Narrative: Case Manager received a consult for [facility] and that patient has used [facility] in the past and wants a new agency. Case Manager spoke with patient and she has no agency choice. Case Manager discussed [facility] and the patient is agreeable to have [facility] review. Palliative is working on pain management regimen for the patient. No further needs identified at this time.                Expected Discharge Plan: Home w Hospice Care Barriers to Discharge: Continued Medical Work up   Patient Goals and CMS Choice Patient states their goals for this hospitalization and ongoing recovery are:: For pain to decrease- Pain management is being tweaked for home.          Expected Discharge Plan and Services In-house Referral: NA Discharge Planning Services: CM Consult Post Acute Care Choice: Hospice Living arrangements for the past 2 months: Single Family Home                           HH Arranged: RN [facility] Agency: [facility] Date HH Agency Contacted: 11/17/23 Time HH Agency Contacted: 1122 Representative spoke with at [facility] Agency: Cheri  Prior Living Arrangements/Services Living arrangements for the past 2 months: Single Family Home Lives with:: Self Patient language and need for interpreter reviewed:: Yes Do you feel safe going back to the place where you live?: Yes            Criminal Activity/Legal Involvement Pertinent to Current Situation/Hospitalization: No - Comment as needed  Activities of Daily Living   ADL Screening (condition at time of admission) Independently performs ADLs?: Yes (appropriate for developmental age) Is the patient deaf or have difficulty  hearing?: No Does the patient have difficulty seeing, even when wearing glasses/contacts?: No Does the patient have difficulty concentrating, remembering, or making decisions?: No  Permission Sought/Granted Permission sought to share information with : Case Production designer, theatre/television/film, Photographer granted to share info w AGENCY: [facility]        Emotional Assessment Appearance:: Appears stated age Attitude/Demeanor/Rapport: Unable to Assess Affect (typically observed): Unable to Assess Orientation: : Oriented to Self, Oriented to Place Alcohol / Substance Use: Not Applicable Psych Involvement: No (comment)  Admission diagnosis:  Dyspnea [R06.00] Dyspnea, unspecified type [R06.00] Congestive heart failure, unspecified HF chronicity, unspecified heart failure type [facility]) [I50.9] Patient Active Problem List   Diagnosis Date Noted   Chronic health problem 11/14/2023   Goals of care, counseling/discussion 07/23/2023   Acute exacerbation of congestive heart failure (HCC) 07/21/2023   GAD (generalized anxiety disorder) 07/18/2023   Acute on chronic combined systolic (congestive) and diastolic (congestive) heart failure (HCC) 07/18/2023   Bipolar 1 disorder (HCC) 07/06/2023   Chronic pain syndrome 05/25/2023   Heart failure (HCC) 05/23/2023   CHF (congestive heart failure) (HCC) 05/22/2023   Hypomagnesemia 05/22/2023   Hypotension 02/14/2023   Severe mitral regurgitation 09/17/2022   CKD stage 3a, GFR 45-59 ml/min (HCC) 09/17/2022   Type 2 diabetes mellitus with hyperglycemia (HCC) 08/30/2022   Hyperthyroidism 08/29/2022   AKI (acute kidney injury) (HCC) 08/21/2022  COPD (chronic obstructive pulmonary disease) (HCC) 08/04/2022   Chronic systolic CHF (congestive heart failure) (HCC) 08/03/2022   Acute on chronic systolic CHF (congestive heart failure) (HCC) 05/15/2022   Overdose, intentional self-harm, initial encounter (HCC) 12/13/2018   DM2  (diabetes mellitus, type 2) (HCC) 01/03/2018   HTN (hypertension) 08/28/2015   PCP:  [facility]:   [id number] Jonette Nestle, [place] - [address] AT [facility] & [address] [place] [address] Phone: [phone] Fax: [phone]  [facility] [address] [place] [address] Phone: [phone] Fax: [phone]     Social Drivers of Health (SDOH) Social History: SDOH Screenings   Food Insecurity: Food Insecurity Present (11/16/2023)  Housing: High Risk (11/16/2023)  Transportation Needs: No Transportation Needs (11/15/2023)  Utilities: Not At Risk (11/15/2023)  Alcohol Screen: Low Risk  (02/12/2023)  Depression (PHQ2-9): High Risk (09/30/2022)  Financial Resource Strain: High Risk (09/30/2022)  Social Connections: Moderately Isolated (11/15/2023)  Stress: No Stress Concern Present (01/07/2022)   Received from [facility], [facility]  Tobacco Use: High Risk (11/14/2023)   SDOH Interventions:     Readmission Risk Interventions    07/23/2023   12:02 PM 07/10/2023    1:21 PM 02/12/2023    5:00 PM  Readmission Risk Prevention Plan  Transportation Screening Complete Complete Complete  Medication Review Oceanographer) Complete Complete   PCP or Specialist appointment within 3-5 days of discharge Complete Complete   HRI or Home Care Consult Complete Complete   SW Recovery Care/Counseling Consult Complete Patient refused   Palliative Care Screening Not Applicable Not Applicable   Skilled Nursing Facility Not Applicable Not Applicable

## 2023-11-17 NOTE — Progress Notes (Signed)
 1106: Patient continues to have respirations and pulse. Is A&O x4. On room air. Current PRN pain medications ordered for every 3 hours. Patient has been asking for pain management approximately every 2 hours. Pain management team notified.  Patient was unable to tolerate PO disintegrating Zofran ; providers switched to IV compazine .

## 2023-11-17 NOTE — Assessment & Plan Note (Addendum)
 Symptoms and BNP and elevated to 2,090 likely secondary to acute exacerbation of congestive heart failure.  Although her BNP was lower in previous admits, most recently in February 2025. EF 20-25%, global hypokinesis, mild LVH, severe MVR, IVC with > 50% respiratory variability.  Not having great output with diuresis currently.  Long discussion with patient and palliative regarding goals of care, she understood the goals of comfort care and was amenable to transitioning to full comfort care.  Will continue to treat symptomatically at this time. - Torsemide 20 mg daily

## 2023-11-17 NOTE — Progress Notes (Signed)
 Pt now requesting for 02 to be removed. 02 removed states that she feels better and does not need it any longer.

## 2023-11-17 NOTE — Assessment & Plan Note (Addendum)
 Cr bumped from 1.15 and continued to rise on p.m. BMP yesterday to 1.73.  No longer checking BMPs due to transition to comfort care. - Avoid nephrotoxic agents

## 2023-11-17 NOTE — Plan of Care (Signed)

## 2023-11-17 NOTE — Progress Notes (Addendum)
 Sats were 81-82% on room air pt denies sob , lungs sounds are diminished . Pt is comfort care asked if she would like 02 applied pt agreed, currently on 4LNC sats ranging from 89-91%. Pt A&Ox4. Provider aware of the following and states goals of care is to keep pt comfortable, no more vitals needed tonight unless upon pt request.   11/17/23 1951  Vitals  Temp (!) 97.5 F (36.4 C)  Temp Source Oral  BP 112/78  MAP (mmHg) 87  BP Location Right Arm  BP Method Automatic  Patient Position (if appropriate) Lying  Pulse Rate 91  Pulse Rate Source Monitor  Resp 18  Level of Consciousness  Level of Consciousness Alert  MEWS COLOR  MEWS Score Color Comfort Care Only  Oxygen  Therapy  SpO2 91 %  O2 Device Nasal Cannula  O2 Flow Rate (L/min) 4 L/min  MEWS Score  MEWS Temp 0  MEWS Systolic 0  MEWS Pulse 0  MEWS RR 0  MEWS LOC 0  MEWS Score 0

## 2023-11-17 NOTE — Progress Notes (Signed)
 Pt sleeping at this time , resting comfortably . Call bell within reach with bed in the lowest position.

## 2023-11-17 NOTE — Progress Notes (Signed)
 Daily Progress Note Intern Pager: [phone]  Patient name: [name] [name] Medical record number: [id number] Date of birth: 21-Aug-1970 Age: 53 y.o. Gender: female  Primary Care Provider: [facility] [place] [facility]: Palliative, Cardiology (signed off) Code Status: DNR-Comfort  Pt Overview and Major Events to Date:  6/6: Admitted; started on Endotool for hyperglycemia 6/7: Transition to Semglee  6/8: Comfort care  Assessment and Plan: [name] [name] is a 53 year old female with PMH of HFrEF, severe MR, COPD, and bipolar 1 disorder who presented with dyspnea and chest pain.  Was admitted for acute on chronic congestive heart failure exacerbation with elevated BNP of 2090 (lower than previous exacerbations), tachypnea, orthopnea, and abdominal distention.  Was treated for possible pneumonia due to increased opacities in the RLL on CXR with a dose of CTX and Azithromycin ; this was discontinued due to procalcitonin being negative.  D-dimer elevated, CT PE negative for PE, otherwise mild mediastinal and bilateral hilar adenopathy and opacities noted in lower lobes.  Continuing to diurese and treat symptomatically as patient has transitioned to comfort care after discussion with palliative.  Will no longer monitor labs or treat hyperglycemia. Assessment & Plan Acute exacerbation of congestive heart failure (HCC) Symptoms and BNP and elevated to 2,090 likely secondary to acute exacerbation of congestive heart failure.  Although her BNP was lower in previous admits, most recently in February 2025. EF 20-25%, global hypokinesis, mild LVH, severe MVR, IVC with > 50% respiratory variability.  Not having great output with diuresis currently.  Long discussion with patient and palliative regarding goals of care, she understood the goals of comfort care and was amenable to transitioning to full comfort care.  Will continue to treat symptomatically at this time. - Torsemide 20 mg daily Goals of care,  counseling/discussion Palliative discussion yesterday and patient decided she would like to pursue full comfort care.  Orders were placed and monitoring/medications were discontinued - Continue GOC discussions and appreciate palliative's assistance with finding hospice - Scheduled Tylenol  650 mg QID and Oxycodone  15 mg q4h prn - Dilaudid  0.5 to 1 mg every 3 as needed - Ativan  1 mg q4h prn - Lidocaine  5% patch ordered Type 2 diabetes mellitus with hyperglycemia (HCC) A1c 8.25 months ago.  Patient presented with AG of 16 and sugars increased to 600 on CBG.  Patient was placed on Endotool with adjustments to her fluids to be mindful of her HFrEF.  Sugars downtrended, and patient was put on Semglee  8 units daily with moderate SSI, which was discontinued d/t comfort care AKI (acute kidney injury) (HCC) Cr bumped from 1.15 and continued to rise on p.m. BMP yesterday to 1.73.  No longer checking BMPs due to transition to comfort care. - Avoid nephrotoxic agents Chronic health problem COPD/Asthma: Continuing home Dulera  with formulary alternative (Breo Ellipta ) and albuterol  2.5 mg as needed.   Encourage tobacco cessation. Anemia of chronic disease: Hgb 11.6. Smoking Hx: Smoked 1/4 ppd, and has been smoking since 53 yo. Started Nicorette  gum since she has had a reaction to the patch before HLD: Discontinued Crestor  20 mg daily  Bipolar disorder/anxiety/depression: Patient not taking Abilify , Atarax , or sertraline  as listed in her home medications.   FEN/GI: Regular diet PPx: Lovenox  Dispo:Home with hospice pending    Subjective:  Patient is doing better today after transitioning to comfort care.  She feels like her pain is better controlled.  She is having adequate bowel movements.  No other concerns this morning.  Objective: Temp:  [97.3 F (36.3 C)-98.5  F (36.9 C)] 98.5 F (36.9 C) (06/09 0855) Pulse Rate:  [81-83] 81 (06/08 1938) Resp:  [17-20] 17 (06/09 0855) BP: (96-109)/(53-79) 109/75  (06/09 0855) SpO2:  [92 %-95 %] 95 % (06/09 0855) Physical Exam: General: Awake and Alert in NAD HEENT: NCAT. Sclera anicteric. No rhinorrhea. Cardiovascular: RRR.  Systolic murmur. Respiratory: CTAB, normal WOB on RA. No wheezing, crackles, rhonchi, or diminished breath sounds. Abdomen: Soft, non-tender, non-distended. Bowel sounds normoactive Extremities: Able to move all extremities. No BLE edema, no deformities or significant joint findings. Skin: Warm and dry. No abrasions or rashes noted. Neuro: A&Ox3. No focal neurological deficits.  Laboratory: Most recent CBC Lab Results  Component Value Date   WBC 7.0 11/14/2023   HGB 11.6 (L) 11/14/2023   HCT 36.3 11/14/2023   MCV 86.0 11/14/2023   PLT 172 11/14/2023   Most recent BMP    Latest Ref Rng & Units 11/16/2023    3:56 PM  BMP  Glucose 70 - 99 mg/dL 782   BUN 6 - 20 mg/dL 25   Creatinine 9.56 - 1.00 mg/dL 2.13   Sodium 086 - 578 mmol/L 137   Potassium 3.5 - 5.1 mmol/L 4.2   Chloride 98 - 111 mmol/L 96   CO2 22 - 32 mmol/L 27   Calcium  8.9 - 10.3 mg/dL 8.2    Imaging/Diagnostic Tests: No new imaging. [name] Dark, DO 11/17/2023, 12:22 PM  PGY-1, [facility] Health Family Medicine FPTS Intern pager: [phone], text pages welcome Secure chat group [facility] Teaching Service

## 2023-11-17 NOTE — Assessment & Plan Note (Signed)
 A1c 8.25 months ago.  Patient presented with AG of 16 and sugars increased to 600 on CBG.  Patient was placed on Endotool with adjustments to her fluids to be mindful of her HFrEF.  Sugars downtrended, and patient was put on Semglee  8 units daily with moderate SSI, which was discontinued d/t comfort care

## 2023-11-17 NOTE — Plan of Care (Signed)
  Problem: Skin Integrity: Goal: Risk for impaired skin integrity will decrease Outcome: Progressing   Problem: Clinical Measurements: Goal: Will remain free from infection Outcome: Progressing Goal: Respiratory complications will improve Outcome: Progressing   Problem: Activity: Goal: Risk for activity intolerance will decrease Outcome: Progressing   Problem: Elimination: Goal: Will not experience complications related to urinary retention Outcome: Progressing   Problem: Safety: Goal: Ability to remain free from injury will improve Outcome: Progressing

## 2023-11-18 DIAGNOSIS — I5023 Acute on chronic systolic (congestive) heart failure: Secondary | ICD-10-CM | POA: Diagnosis not present

## 2023-11-18 DIAGNOSIS — Z7189 Other specified counseling: Secondary | ICD-10-CM

## 2023-11-18 DIAGNOSIS — I5020 Unspecified systolic (congestive) heart failure: Secondary | ICD-10-CM

## 2023-11-18 DIAGNOSIS — R0600 Dyspnea, unspecified: Secondary | ICD-10-CM | POA: Diagnosis not present

## 2023-11-18 DIAGNOSIS — Z515 Encounter for palliative care: Secondary | ICD-10-CM | POA: Diagnosis not present

## 2023-11-18 MED ORDER — HYDROMORPHONE HCL 1 MG/ML IJ SOLN: 0.5000 mg | INTRAMUSCULAR | Status: DC | PRN

## 2023-11-18 MED ORDER — FENTANYL 75 MCG/HR TD PT72
1.0000 | MEDICATED_PATCH | TRANSDERMAL | Status: DC
  Administered 2023-11-18: 1 via TRANSDERMAL
  Filled 2023-11-18 (×2): qty 1

## 2023-11-18 MED ORDER — FUROSEMIDE 40 MG PO TABS: 40.0000 mg | ORAL_TABLET | Freq: Every day | ORAL | Status: DC

## 2023-11-18 MED ORDER — HYDROMORPHONE HCL 2 MG PO TABS: 2.0000 mg | ORAL_TABLET | ORAL | Status: DC | PRN

## 2023-11-18 MED ORDER — FUROSEMIDE 40 MG PO TABS
40.0000 mg | ORAL_TABLET | Freq: Every day | ORAL | Status: DC
  Administered 2023-11-19: 40 mg via ORAL
  Filled 2023-11-18: qty 1

## 2023-11-18 MED ORDER — HYDROMORPHONE HCL 2 MG PO TABS
2.0000 mg | ORAL_TABLET | ORAL | Status: DC | PRN
  Administered 2023-11-18 – 2023-11-19 (×5): 2 mg via ORAL
  Administered 2023-11-19: 4 mg via ORAL
  Filled 2023-11-18: qty 1
  Filled 2023-11-18: qty 2
  Filled 2023-11-18 (×4): qty 1

## 2023-11-18 NOTE — Assessment & Plan Note (Addendum)
 A1c 8.25 months ago.  Patient presented with AG of 16 and sugars increased to 600 on CBG.  Patient was placed on Endotool with adjustments to her fluids to be mindful of her HFrEF.  Sugars downtrended, and patient was put on Semglee  8 units daily with moderate SSI, which was discontinued d/t comfort care

## 2023-11-18 NOTE — Assessment & Plan Note (Addendum)
 Palliative discussion yesterday and patient decided she would like to pursue full comfort care.  Orders were placed and monitoring/medications were discontinued - Continue GOC discussions and appreciate palliative's assistance with finding hospice - Pain reg: Tylenol  650 mg QID, OxyContin  15 mg q8h, Oxycodone  IR 15 mg q4h prn, IV Dilaudid  0.5-1 mg q3h prn for breakthrough pain (not relieved by oxycodone  IR) - Ativan  1 mg PO/IV mg q4h prn for anxiety/seizures/sleep - Compazine  5 mg IV q6h prn for nausea - Lidocaine  5% patch ordered

## 2023-11-18 NOTE — Assessment & Plan Note (Addendum)
 COPD/Asthma: Continuing home Dulera  with formulary alternative (Breo Ellipta ) and albuterol  2.5 mg as needed.   Encourage tobacco cessation. Anemia of chronic disease: Hgb 11.6. Smoking Hx: Smoked 1/4 ppd, and has been smoking since 53 yo. Started Nicorette  gum since she has had a reaction to the patch before HLD: Discontinued Crestor  20 mg daily  Bipolar disorder/anxiety/depression: Patient not taking Abilify , Atarax , or sertraline  as listed in her home medications.

## 2023-11-18 NOTE — TOC Progression Note (Addendum)
 Transition of Care [facility]) - Progression Note    Patient Details  Name: [name] [name] MRN: [id number] Date of Birth: 03/19/1971  [facility]) CM/SW Contact  Jennett Model, RN Phone Number: 11/18/2023, 4:23 PM  Clinical Narrative:    Per Cheri with [facility], patient is not eligible for Hospice, through their agency,  She has had improvement in her EF and does not need any oxygen  with walking down the hallway for a good distance.  They can pick her up under Palliative services for now and then if she further declines they can pick up under Hospice.  Patient informed of this information and is in agreement with it.  NCM notified doctor.  1  13:02 CK       Expected Discharge Plan: Home w Hospice Care Barriers to Discharge: Continued Medical Work up  Expected Discharge Plan and Services In-house Referral: NA Discharge Planning Services: CM Consult Post Acute Care Choice: Hospice Living arrangements for the past 2 months: Single Family Home                           HH Arranged: RN [facility] Agency: [facility] Date HH Agency Contacted: 11/17/23 Time HH Agency Contacted: 1122 Representative spoke with at [facility] Agency: Cheri   Social Determinants of Health (SDOH) Interventions SDOH Screenings   Food Insecurity: Food Insecurity Present (11/16/2023)  Housing: High Risk (11/16/2023)  Transportation Needs: No Transportation Needs (11/15/2023)  Utilities: Not At Risk (11/15/2023)  Alcohol Screen: Low Risk  (02/12/2023)  Depression (PHQ2-9): High Risk (09/30/2022)  Financial Resource Strain: High Risk (09/30/2022)  Social Connections: Moderately Isolated (11/15/2023)  Stress: No Stress Concern Present (01/07/2022)   Received from [facility], [facility]  Tobacco Use: High Risk (11/14/2023)    Readmission Risk Interventions    07/23/2023   12:02 PM 07/10/2023    1:21 PM 02/12/2023    5:00 PM  Readmission Risk Prevention Plan  Transportation  Screening Complete Complete Complete  Medication Review Oceanographer) Complete Complete   PCP or Specialist appointment within 3-5 days of discharge Complete Complete   HRI or Home Care Consult Complete Complete   SW Recovery Care/Counseling Consult Complete Patient refused   Palliative Care Screening Not Applicable Not Applicable   Skilled Nursing Facility Not Applicable Not Applicable

## 2023-11-18 NOTE — Progress Notes (Signed)
 Daily Progress Note Intern Pager: [phone]  Patient name: [name] [name] Medical record number: [id number] Date of birth: 01-Sep-1970 Age: 53 y.o. Gender: female  Primary Care Provider: [facility] [place] [facility]: Palliative, cardiology (signed off) Code Status: DNR-comfort  Pt Overview and Major Events to Date:  6/6: Admitted; started on Endotool for hyperglycemia 6/7: Transition to Semglee  6/8: Comfort care  Assessment and Plan: [name] [name] is a 53 year old female with PMH of HFrEF, severe MR, COPD and bipolar 1 disorder who presented with dyspnea and chest pain.  Was admitted for acute on chronic congestive heart failure exacerbation which is being treated with torsemide daily.  Cardiology was consulted but signed off due to patient having terminal HF intolerable to GDMT due to her hypotension.  Patient was treated for possible pneumonia with 1 dose of CTX and azithromycin  but discontinued after procalcitonin was negative.  CTPE negative for PE since D-dimer was positive.  Continue to diurese and treat symptomatically as patient has transitioned to comfort care after discussion with palliative care.  Will no longer monitor labs or treat hyperglycemia. Assessment & Plan Acute exacerbation of congestive heart failure (HCC) Symptoms and BNP and elevated to 2,090 likely secondary to acute exacerbation of congestive heart failure.  Although her BNP was lower in previous admits, most recently in February 2025. EF 20-25%, global hypokinesis, mild LVH, severe MVR, IVC with > 50% respiratory variability.  Not having great output with diuresis currently.  Long discussion with patient and palliative regarding goals of care, she understands the goals of comfort care and would like to continue being comfort care, will titrate pain meds per palliative as indicated.  Will continue to treat symptomatically at this time.  - Torsemide 20 mg daily, will switch to Lasix  per patient  preference Goals of care, counseling/discussion Palliative discussion yesterday and patient decided she would like to pursue full comfort care.  Orders were placed and monitoring/medications were discontinued - Continue GOC discussions and appreciate palliative's assistance with finding hospice - Pain reg: Tylenol  650 mg QID, OxyContin  15 mg q8h, Oxycodone  IR 15 mg q4h prn, IV Dilaudid  0.5-1 mg q3h prn for breakthrough pain (not relieved by oxycodone  IR) - Ativan  1 mg PO/IV mg q4h prn for anxiety/seizures/sleep - Compazine  5 mg IV q6h prn for nausea - Lidocaine  5% patch ordered Type 2 diabetes mellitus with hyperglycemia (HCC) A1c 8.25 months ago.  Patient presented with AG of 16 and sugars increased to 600 on CBG.  Patient was placed on Endotool with adjustments to her fluids to be mindful of her HFrEF.  Sugars downtrended, and patient was put on Semglee  8 units daily with moderate SSI, which was discontinued d/t comfort care. AKI (acute kidney injury) (HCC) Cr bumped from 1.15 and continued to rise on p.m. BMP yesterday to 1.73.  No longer checking BMPs due to transition to comfort care. - Avoid nephrotoxic agents Chronic health problem COPD/Asthma: Continuing home Dulera  with formulary alternative (Breo Ellipta ) and albuterol  2.5 mg as needed.   Encourage tobacco cessation. Anemia of chronic disease: Hgb 11.6. Smoking Hx: Smoked 1/4 ppd, and has been smoking since 53 yo. Started Nicorette  gum since she has had a reaction to the patch before HLD: Discontinued Crestor  20 mg daily  Bipolar disorder/anxiety/depression: Patient not taking Abilify , Atarax , or sertraline  as listed in her home medications.   FEN/GI: Regular diet PPx: None Dispo:Home with hospice.  Subjective:  Patient reports that she feels better this morning, however she hasn't been able to  sleep well. No other concerns at this time.  Objective: Temp:  [97.5 F (36.4 C)] 97.5 F (36.4 C) (06/09 1951) Pulse Rate:  [91] 91  (06/09 1951) Resp:  [18] 18 (06/09 1951) BP: (112)/(78) 112/78 (06/09 1951) SpO2:  [91 %-93 %] 93 % (06/10 0804) Physical Exam: General: Awake and Alert in NAD HEENT: NCAT. Sclera anicteric. No rhinorrhea. Cardiovascular: RRR. Systolic murmur Respiratory: CTAB, normal WOB on RA. No wheezing, crackles, rhonchi, or diminished breath sounds. Abdomen: Soft, non-tender, non-distended. Bowel sounds normoactive Extremities: Able to move all extremities. No BLE edema, no deformities or significant joint findings. Skin: Warm and dry. No abrasions or rashes noted. Neuro: A&Ox3. No focal neurological deficits.  Laboratory: Most recent CBC Lab Results  Component Value Date   WBC 7.0 11/14/2023   HGB 11.6 (L) 11/14/2023   HCT 36.3 11/14/2023   MCV 86.0 11/14/2023   PLT 172 11/14/2023   Most recent BMP    Latest Ref Rng & Units 11/16/2023    3:56 PM  BMP  Glucose 70 - 99 mg/dL 409   BUN 6 - 20 mg/dL 25   Creatinine 8.11 - 1.00 mg/dL 9.14   Sodium 782 - 956 mmol/L 137   Potassium 3.5 - 5.1 mmol/L 4.2   Chloride 98 - 111 mmol/L 96   CO2 22 - 32 mmol/L 27   Calcium  8.9 - 10.3 mg/dL 8.2    Imaging/Diagnostic Tests: No new imaging.  [name] Dark, DO 11/18/2023, 1:14 PM  PGY-1, [facility] Health Family Medicine FPTS Intern pager: [phone], text pages welcome Secure chat group [facility] Teaching Service

## 2023-11-18 NOTE — Progress Notes (Signed)
 Palliative Medicine Progress Note   Patient Name: [name] [name]       Date: 11/18/2023 DOB: 19-Oct-1970  Age: 53 y.o. MRN#: [id number] Attending Physician: Azell Boll, MD Primary Care Physician: [facility] [place] [facility] Admit Date: 11/14/2023  Reason for Consultation/Follow-up: {Reason for Consult:23484}  HPI/Patient Profile: 53 y.o. female  with past medical history of underlying bipolar disorder, anxiety disorder, ongoing tobacco use and underlying COPD, hypertension, HFrEF secondary to severe mitral regurgitation that is not amenable to repair procedure, hypothyroidism, CKD stage IIIb admitted on 11/14/2023 with chest pain and shortness of breath.   She was discharged from home hospice 11/09/23.    While in the ED the patient shared she would like to treat the treatable and be admitted and then return home with hospice after she was feeling better.  Subjective: Chart reviewed. Per MAR, in the last 24 hours patient received 7 mg IV dilaudid  and 30 mg oxycodone  IR.   Patient assessed at bedside.  She reports she is getting minimal relief with oxycodone  IR, even after starting long-acting OxyContin .  For example, if her pain is 10/10 the oxycodone  is only bringing it down to 9/10.  On further discussion, patient reports that she has been taking oxycodone  for many years for chronic back/knee/hip pain.  Objective:  Physical Exam Vitals reviewed.  Constitutional:      General: She is not in acute distress.    Appearance: She is not ill-appearing.     Comments: Chronically ill-appearing  Pulmonary:     Effort: No respiratory distress.  Neurological:     Mental Status: She is alert.             LBM: Last BM Date : 11/16/23 (per pt)     Palliative Medicine Assessment & Plan    Assessment: Principal Problem:   Acute exacerbation of congestive heart failure (HCC) Active Problems:   AKI (acute kidney injury) (HCC)   Type 2 diabetes mellitus with hyperglycemia (HCC)   Hypotension   Goals of care, counseling/discussion   Chronic health problem    Recommendations/Plan: Continue comfort-focused care Plan to discharge home with hospice once pain regimen is optimized PMT will follow-up tomorrow  Symptom Management: Start fentanyl  patch 75 mcg/hr every 72 hours Start po dilaudid  2-4 mg every 3 hours as needed for breakthrough pain Discontinue  oxycontin  15 mg every 8 hours Discontinue oxycodone  IR 15 mg every 4 hours prn Discontinue IV dilaudid   Ativan  1 mg po every 4 hours prn for anxiety/seizure/sleep Compazine  5 mg IV every 6 hours prn nausea Bowel regimen: Miralax  17 gm daily Senna 1 tablet at bedtime prn constipation  Code Status: DNR - comfort   Prognosis:  < 6 months  Discharge Planning: Home with Hospice  Care plan was discussed with ***  Thank you for allowing the Palliative Medicine Team to assist in the care of this patient.   ***   Wynetta Heckle, NP   Please contact Palliative Medicine Team phone at [phone] for questions and concerns.  For individual providers, please see AMION.

## 2023-11-18 NOTE — Assessment & Plan Note (Addendum)
 Cr bumped from 1.15 and continued to rise on p.m. BMP yesterday to 1.73.  No longer checking BMPs due to transition to comfort care. - Avoid nephrotoxic agents

## 2023-11-18 NOTE — Assessment & Plan Note (Addendum)
 Symptoms and BNP and elevated to 2,090 likely secondary to acute exacerbation of congestive heart failure.  Although her BNP was lower in previous admits, most recently in February 2025. EF 20-25%, global hypokinesis, mild LVH, severe MVR, IVC with > 50% respiratory variability.  Not having great output with diuresis currently.  Long discussion with patient and palliative regarding goals of care, she understands the goals of comfort care and would like to continue being comfort care, will titrate pain meds per palliative as indicated.  Will continue to treat symptomatically at this time.  - Torsemide 20 mg daily, will switch to Lasix  per patient preference

## 2023-11-19 DIAGNOSIS — Z515 Encounter for palliative care: Secondary | ICD-10-CM | POA: Diagnosis not present

## 2023-11-19 DIAGNOSIS — I340 Nonrheumatic mitral (valve) insufficiency: Secondary | ICD-10-CM

## 2023-11-19 MED ORDER — HYDROMORPHONE HCL 2 MG PO TABS
4.0000 mg | ORAL_TABLET | ORAL | Status: DC | PRN
  Administered 2023-11-19 – 2023-11-24 (×27): 4 mg via ORAL
  Filled 2023-11-19 (×27): qty 2

## 2023-11-19 MED ORDER — POLYETHYLENE GLYCOL 3350 17 G PO PACK
17.0000 g | PACK | Freq: Two times a day (BID) | ORAL | Status: DC
  Administered 2023-11-20 – 2023-11-26 (×10): 17 g via ORAL
  Filled 2023-11-19 (×13): qty 1

## 2023-11-19 MED ORDER — FUROSEMIDE 40 MG PO TABS
40.0000 mg | ORAL_TABLET | Freq: Two times a day (BID) | ORAL | Status: DC
  Administered 2023-11-19 – 2023-11-21 (×4): 40 mg via ORAL
  Filled 2023-11-19 (×4): qty 1

## 2023-11-19 MED ORDER — SERTRALINE HCL 25 MG PO TABS
25.0000 mg | ORAL_TABLET | Freq: Every day | ORAL | Status: DC
  Administered 2023-11-19 – 2023-11-26 (×8): 25 mg via ORAL
  Filled 2023-11-19 (×8): qty 1

## 2023-11-19 MED ORDER — ALUM & MAG HYDROXIDE-SIMETH 200-200-20 MG/5ML PO SUSP
15.0000 mL | Freq: Two times a day (BID) | ORAL | Status: DC | PRN
  Administered 2023-11-19 – 2023-11-21 (×2): 15 mL via ORAL
  Filled 2023-11-19 (×3): qty 30

## 2023-11-19 MED ORDER — CALCIUM CARBONATE ANTACID 500 MG PO CHEW: 1.0000 | CHEWABLE_TABLET | Freq: Two times a day (BID) | ORAL | Status: DC | PRN

## 2023-11-19 NOTE — Assessment & Plan Note (Addendum)
 Cr bumped from 1.15 > 1.73.  No longer checking BMPs due to transition to comfort care. - Avoid nephrotoxic agents

## 2023-11-19 NOTE — Progress Notes (Signed)
 Palliative Medicine Inpatient Follow Up Note HPI: 53 y.o. female  with past medical history of underlying bipolar disorder, anxiety disorder, ongoing tobacco use and underlying COPD, hypertension, HFrEF secondary to severe mitral regurgitation that is not amenable to repair procedure, hypothyroidism, CKD stage IIIb admitted on 11/14/2023 with chest pain and shortness of breath.   She was discharged from home hospice 11/09/23.    While in the ED the patient shared she would like to treat the treatable and be admitted and then return home with hospice after she was feeling better and her pain was controlled.  Today's Discussion 11/19/2023  *Please note that this is a verbal dictation therefore any spelling or grammatical errors are due to the Dragon Medical One system interpretation.  Chart reviewed inclusive of vital signs, progress notes, laboratory results, and diagnostic images. Has had 14mg  of oral dilaudid  since last night. Has received 3mg  of ativan  in the last 24 hours.   Opoid rotation initiated yesterday - now on a fentanyl  patch.   I met with [name] [name] at bedside this morning. She shares that she is experiencing reflux which is quite uncomfortable. She notes that she does not have good pain control at this time per her own self assessment though she shares it is also difficult to tell this morning.  Patient shares ongoing anxiety which ativan  does help with. We also discussed re-started sertraline  for GAD which she is in agreement with.   Created space and opportunity for patient to explore thoughts feelings and fears regarding current medical situation. She shares the hope to have her symptoms controlled well enough to get back home.  Plan for ongoing support.  Questions and concerns addressed/Palliative Support Provided.   Objective Assessment: Vital Signs Vitals:   11/19/23 0751 11/19/23 1200  BP: 112/81 126/80  Pulse: (!) 50 98  Resp: 18   Temp: 99.5 F (37.5 C)   SpO2:  94%     Intake/Output Summary (Last 24 hours) at 11/19/2023 1255 Last data filed at 11/19/2023 0900 Gross per 24 hour  Intake 240 ml  Output --  Net 240 ml   Last Weight  Most recent update: 11/16/2023  4:29 AM    Weight  64.9 kg (143 lb 1.3 oz)            Gen:  Middle aged Caucasian G chronically ill appearing HEENT: moist mucous membranes CV: Regular rate and rhythm  PULM: On 4LPM Fults, breathing is even and nonlabored ABD: soft/nontender  EXT: No edema  Neuro: Alert and oriented x3   SUMMARY OF RECOMMENDATIONS   DNAR Continue comfort-focused care Plan to discharge home once pain regimen is optimized Plan to have evaluated by [facility] for home hospice services Please note - Outpatient palliative does not provide pain management services or prescribe opioid refills PMT will follow-up tomorrow   Symptom Management: Fentanyl  patch 75 mcg/hr every 72 hours (Started 6/11 at 1725) - will take 24 hours to reach therapeutic levels Continue oral dilaudid  2-4 mg every 3 hours as needed for breakthrough pain Discontinue oxycontin  15 mg every 8 hours Discontinue oxycodone  IR 15 mg every 4 hours prn Discontinue IV dilaudid   Ativan  1 mg po every 4 hours prn for anxiety/seizure/sleep Compazine  5 mg IV every 6 hours prn nausea Bowel regimen: Miralax  17 gm daily Senna 1 tablet at bedtime prn constipation   Code Status: DNR - comfort ______________________________________________________________________________________ [facility] [place] Palliative Medicine Team Team Cell Phone: [phone] Please utilize secure chat with additional questions,  if there is no response within 30 minutes please call the above phone number  Time Spent: 50 Billing based on MDM: High  Palliative Medicine Team providers are available by phone from 7am to 7pm daily and can be reached through the team cell phone.  Should this patient require assistance outside of these hours, please call the  patient's attending physician.

## 2023-11-19 NOTE — Assessment & Plan Note (Addendum)
 BNP 2,090.  February 2025. EF 20-25%, global hypokinesis, mild LVH, severe MVR, IVC with > 50% respiratory variability.  After discussion with palliative patient would like to pursue comfort care since she has HF with severe MR which is terminal and is unable to tolerate GDMT.  Will continue to treat symptomatically at this time.  - Lasix  40 mg BID

## 2023-11-19 NOTE — Assessment & Plan Note (Addendum)
 COPD/Asthma: Continuing home Dulera  with formulary alternative (Breo Ellipta ) and albuterol  2.5 mg as needed.   Anemia of chronic disease: Hgb 11.6. Smoking Hx: Smoked 1/4 ppd, and has been smoking since 53 yo. Started Nicorette  gum since she has had a reaction to the patch before HLD: Discontinued Crestor  20 mg daily  Bipolar disorder/anxiety/depression: Patient not taking Abilify , Atarax , or sertraline  as listed in her home medications.

## 2023-11-19 NOTE — Care Plan (Signed)
 Discussed with nursing that this patient is truly comfort care and her pain should be adequately managed per palliative's recommendations and orders. All medication management in addition to pain medications is for symptomatic treatment and comfort only, not for treating her conditions. Appreciate nursing's assistance is caring for this patient.   Clyda Dark, DO 11/19/23 1:07 PM

## 2023-11-19 NOTE — Assessment & Plan Note (Addendum)
-   Home with hospice, awaiting disposition - Pain reg: Tylenol  650 mg QID, Fentanyl  patch 75 mcg/hr every 72 hours, Dilaudid  2-4 mg q3h prn for breakthrough pain - Bowel reg: Miralax  17g daily and Senna daily prn at bedtime - Ativan  1 mg PO/IV mg q4h prn for anxiety/seizures/sleep - Compazine  5 mg IV q6h prn for nausea - Lidocaine  5% patch ordered - Palliative on board, appreciate recommendations

## 2023-11-19 NOTE — TOC Progression Note (Addendum)
 Transition of Care [facility]) - Progression Note    Patient Details  Name: [name] [name] MRN: [id number] Date of Birth: 1971/05/21  Transition of Care [facility]) CM/SW Contact  Jennett Model, RN Phone Number: 11/19/2023, 9:03 AM  Clinical Narrative:    NCM made referral out to [facility] for Home Hospice to see if she would be eligible for them.  1630- Shawn with [facility] states they are able to take referral for Home Hospice.  MD and patient is aware.   Expected Discharge Plan: Home w Hospice Care Barriers to Discharge: Continued Medical Work up  Expected Discharge Plan and Services In-house Referral: NA Discharge Planning Services: CM Consult Post Acute Care Choice: Hospice Living arrangements for the past 2 months: Single Family Home                           HH Arranged: RN [facility] Agency: [facility] Date HH Agency Contacted: 11/17/23 Time HH Agency Contacted: 1122 Representative spoke with at [facility] Agency: Cheri   Social Determinants of Health (SDOH) Interventions SDOH Screenings   Food Insecurity: Food Insecurity Present (11/16/2023)  Housing: High Risk (11/16/2023)  Transportation Needs: No Transportation Needs (11/15/2023)  Utilities: Not At Risk (11/15/2023)  Alcohol Screen: Low Risk  (02/12/2023)  Depression (PHQ2-9): High Risk (09/30/2022)  Financial Resource Strain: High Risk (09/30/2022)  Social Connections: Moderately Isolated (11/15/2023)  Stress: No Stress Concern Present (01/07/2022)   Received from [facility]  Tobacco Use: High Risk (11/14/2023)    Readmission Risk Interventions    07/23/2023   12:02 PM 07/10/2023    1:21 PM 02/12/2023    5:00 PM  Readmission Risk Prevention Plan  Transportation Screening Complete Complete Complete  Medication Review [facility]) Complete Complete   PCP or Specialist appointment within 3-5 days of discharge Complete Complete   HRI or Home Care Consult Complete Complete   SW Recovery  Care/Counseling Consult Complete Patient refused   Palliative Care Screening Not Applicable Not Applicable   Skilled Nursing Facility Not Applicable Not Applicable

## 2023-11-19 NOTE — Assessment & Plan Note (Addendum)
 A1c 8.25 months ago.  Patient presented with AG of 16 and sugars increased to 600 on CBG.  Patient was placed on Endotool with adjustments to her fluids to be mindful of her HFrEF.  Sugars downtrended, and patient was put on Semglee  8 units daily with moderate SSI, which was discontinued d/t comfort care. - Discontinued CBG checks per patient preference

## 2023-11-19 NOTE — Plan of Care (Signed)
 Pt starting to cope well with pain management. Pt needing pain medication every 3 hours.

## 2023-11-19 NOTE — Progress Notes (Addendum)
 Daily Progress Note Intern Pager: [phone]  Patient name: [name] [name] Medical record number: [id number] Date of birth: Feb 19, 1971 Age: 53 y.o. Gender: female  Primary Care Provider: [facility] Consultants: Palliative, Cardiology (signed off) Code Status: DNR-Comfort  Pt Overview and Major Events to Date:  6/6: Admitted; started on Endotool for hyperglycemia 6/7: Transition to Semglee  6/8: Comfort care  Assessment and Plan: [name] [name] is a 53 year old female with PMH of HFrEF, severe MR, COPD, and bipolar 1 disorder who presented with dyspnea and chest pain.  She was admitted for acute on chronic congestive heart failure exacerbation which is being treated with Lasix , per patient preference.  She was also treated for possible pneumonia with 1 dose of CTX and azithromycin , but can discontinued after procalcitonin was negative.  CTPE negative for PE since D-dimer was positive.  Continue to diurese and treat symptomatically as patient has transitioned to comfort care after discussion with palliative. Assessment & Plan Acute exacerbation of congestive heart failure (HCC) BNP 2,090.  February 2025. EF 20-25%, global hypokinesis, mild LVH, severe MVR, IVC with > 50% respiratory variability.  After discussion with palliative patient would like to pursue comfort care since she has HF with severe MR which is terminal and is unable to tolerate GDMT.  Will continue to treat symptomatically at this time.  - Lasix  40 mg BID Goals of care, counseling/discussion  - Home with hospice, awaiting disposition - Pain reg: Tylenol  650 mg QID, Fentanyl  patch 75 mcg/hr every 72 hours, Dilaudid  2-4 mg q3h prn for breakthrough pain - Bowel reg: Miralax  17g daily and Senna daily prn at bedtime - Ativan  1 mg PO/IV mg q4h prn for anxiety/seizures/sleep - Compazine  5 mg IV q6h prn for nausea - Lidocaine  5% patch ordered - Palliative on board, appreciate recommendations Type 2 diabetes  mellitus with hyperglycemia (HCC) A1c 8.25 months ago.  Patient presented with AG of 16 and sugars increased to 600 on CBG.  Patient was placed on Endotool with adjustments to her fluids to be mindful of her HFrEF.  Sugars downtrended, and patient was put on Semglee  8 units daily with moderate SSI, which was discontinued d/t comfort care. - Discontinued CBG checks per patient preference AKI (acute kidney injury) (HCC) Cr bumped from 1.15 > 1.73.  No longer checking BMPs due to transition to comfort care. - Avoid nephrotoxic agents Chronic health problem COPD/Asthma: Continuing home Dulera  with formulary alternative (Breo Ellipta ) and albuterol  2.5 mg as needed.   Anemia of chronic disease: Hgb 11.6. Smoking Hx: Smoked 1/4 ppd, and has been smoking since 53 yo. Started Nicorette  gum since she has had a reaction to the patch before HLD: Discontinued Crestor  20 mg daily  Bipolar disorder/anxiety/depression: Patient not taking Abilify , Atarax , or sertraline  as listed in her home medications.   FEN/GI: Regular diet PPx: None Dispo: Home with hospice, awaiting agency  Subjective:  Patient reports that she continues to be in a lot of pain.  She feels like she wakes up every 2-3 hours and pain and cannot get adequate sleep.  Discussed working with palliative to titrate her medications.  Objective: Temp:  [98.1 F (36.7 C)-99.5 F (37.5 C)] 99.5 F (37.5 C) (06/11 0751) Pulse Rate:  [50-92] 50 (06/11 0751) Resp:  [18-20] 18 (06/11 0751) BP: (108-113)/(81-95) 112/81 (06/11 0751) SpO2:  [93 %-94 %] 94 % (06/11 0751) Physical Exam: General: Awake and Alert in NAD HEENT: NCAT. Sclera anicteric. No rhinorrhea. Cardiovascular: RRR. No M/R/G Respiratory: CTAB, normal  WOB on RA. No wheezing, crackles, rhonchi, or diminished breath sounds. Abdomen: Soft, non-tender, non-distended. Bowel sounds normoactive Extremities: Able to move all extremities. No BLE edema, no deformities or significant joint  findings. Skin: Warm and dry. No abrasions or rashes noted. Neuro: A&Ox3. No focal neurological deficits.  Laboratory: Most recent CBC Lab Results  Component Value Date   WBC 7.0 11/14/2023   HGB 11.6 (L) 11/14/2023   HCT 36.3 11/14/2023   MCV 86.0 11/14/2023   PLT 172 11/14/2023   Most recent BMP    Latest Ref Rng & Units 11/16/2023    3:56 PM  BMP  Glucose 70 - 99 mg/dL 132   BUN 6 - 20 mg/dL 25   Creatinine 4.40 - 1.00 mg/dL 1.02   Sodium 725 - 366 mmol/L 137   Potassium 3.5 - 5.1 mmol/L 4.2   Chloride 98 - 111 mmol/L 96   CO2 22 - 32 mmol/L 27   Calcium  8.9 - 10.3 mg/dL 8.2     Imaging/Diagnostic Tests: No new imaging.  Clyda Dark, DO 11/19/2023, 10:22 AM  PGY-1, [facility] FPTS Intern pager: [phone], text pages welcome Secure chat group [facility] Teaching Service '

## 2023-11-20 DIAGNOSIS — I5023 Acute on chronic systolic (congestive) heart failure: Secondary | ICD-10-CM | POA: Diagnosis not present

## 2023-11-20 MED ORDER — SENNA 8.6 MG PO TABS
1.0000 | ORAL_TABLET | Freq: Two times a day (BID) | ORAL | Status: DC
  Administered 2023-11-20 – 2023-11-26 (×12): 8.6 mg via ORAL
  Filled 2023-11-20 (×13): qty 1

## 2023-11-20 MED ORDER — PANTOPRAZOLE SODIUM 40 MG PO TBEC
40.0000 mg | DELAYED_RELEASE_TABLET | Freq: Every day | ORAL | Status: DC
  Administered 2023-11-20 – 2023-11-26 (×7): 40 mg via ORAL
  Filled 2023-11-20 (×7): qty 1

## 2023-11-20 MED ORDER — POLYETHYLENE GLYCOL 3350 17 G PO PACK
17.0000 g | PACK | Freq: Every day | ORAL | Status: AC | PRN
Start: 2023-11-20 — End: ?
  Administered 2023-11-22: 17 g via ORAL
  Filled 2023-11-20: qty 1

## 2023-11-20 MED ORDER — FENTANYL 100 MCG/HR TD PT72
1.0000 | MEDICATED_PATCH | TRANSDERMAL | Status: DC
  Administered 2023-11-20 – 2023-11-26 (×3): 1 via TRANSDERMAL
  Filled 2023-11-20 (×4): qty 1

## 2023-11-20 NOTE — Assessment & Plan Note (Addendum)
 BNP 2,090.  February 2025. EF 20-25%, global hypokinesis, mild LVH, severe MVR, IVC with > 50% respiratory variability.  After discussion with palliative patient would like to pursue comfort care since she has HF with severe MR which is terminal and is unable to tolerate GDMT.  Will continue to treat symptomatically at this time.  - Lasix  40 mg BID

## 2023-11-20 NOTE — Assessment & Plan Note (Signed)
 Accepted by [facility] for home hospice services. - Pain reg: Tylenol  650 mg QID, Fentanyl  patch 100 mcg/hr every 72 hours, Dilaudid  4 mg q3h prn for breakthrough pain - Bowel reg: Miralax  17g BID and Senna daily prn at bedtime - Ativan  1 mg PO/IV mg q4h prn for anxiety/seizures/sleep - Compazine  5 mg IV q6h prn for nausea - Lidocaine  5% patch ordered - Palliative on board, appreciate recommendations

## 2023-11-20 NOTE — Assessment & Plan Note (Signed)
 COPD/Asthma: Continuing home Dulera  with formulary alternative (Breo Ellipta ) and albuterol  2.5 mg as needed.   Anemia of chronic disease: Hgb 11.6. Smoking Hx: Smoked 1/4 ppd, and has been smoking since 53 yo. Started Nicorette  gum since she has had a reaction to the patch before HLD: Discontinued Crestor  20 mg daily  Bipolar disorder/anxiety/depression: Patient not taking Abilify , Atarax , or sertraline  as listed in her home medications.

## 2023-11-20 NOTE — Assessment & Plan Note (Signed)
 Cr bumped from 1.15 > 1.73.  No longer checking BMPs due to transition to comfort care. - Avoid nephrotoxic agents

## 2023-11-20 NOTE — Progress Notes (Signed)
 Daily Progress Note Intern Pager: [phone]  Patient name: [name] [name] Medical record number: [id number] Date of birth: 12-15-1970 Age: 53 y.o. Gender: female  Primary Care Provider: [facility] [place] [facility] Consultants: Palliative, Cardiology (signed off) Code Status: DNR-Comfort  Pt Overview and Major Events to Date:  6/6: Admitted; started on Endotool for hyperglycemia 6/7: Transition to Semglee  6/8: Comfort Care  Assessment and Plan: [name] [name] is a 54 year old female with PMH of HFrEF, severe MR, COPD, and bipolar 1 disorder who presented with dyspnea and chest pain.  She was admitted for acute on chronic congestive heart failure exacerbation which is being treated with Lasix , per patient preference.  She was also treated for possible pneumonia with 1 dose of CTX and azithromycin , but due to negative procalcitonin.  CTPE negative for PE since D-dimer was positive.  Patient is being diuresed for comfort and treated symptomatically since she is comfort care after discussion with palliative.  Palliative continues to titrate her pain regimen to provide adequate comfort. Assessment & Plan Acute exacerbation of congestive heart failure (HCC) BNP 2,090.  February 2025. EF 20-25%, global hypokinesis, mild LVH, severe MVR, IVC with > 50% respiratory variability.  After discussion with palliative patient would like to pursue comfort care since she has HF with severe MR which is terminal and is unable to tolerate GDMT.  Will continue to treat symptomatically at this time.  - Lasix  40 mg BID Goals of care, counseling/discussion Accepted by [facility] for home hospice services. - Pain reg: Tylenol  650 mg QID, Fentanyl  patch 100 mcg/hr every 72 hours, Dilaudid  4 mg q3h prn for breakthrough pain - Bowel reg: Miralax  17g BID and Senna daily prn at bedtime - Ativan  1 mg PO/IV mg q4h prn for anxiety/seizures/sleep - Compazine  5 mg IV q6h prn for nausea - Lidocaine  5% patch ordered -  Palliative on board, appreciate recommendations Type 2 diabetes mellitus with hyperglycemia (HCC) A1c 8.25 months ago.  Patient presented with AG of 16 and sugars increased to 600 on CBG.  Patient was placed on Endotool with adjustments to her fluids to be mindful of her HFrEF.  Sugars downtrended, and patient was put on Semglee  8 units daily with moderate SSI, which was discontinued d/t comfort care. - Discontinued CBG checks per patient preference AKI (acute kidney injury) (HCC) Cr bumped from 1.15 > 1.73.  No longer checking BMPs due to transition to comfort care. - Avoid nephrotoxic agents Chronic health problem COPD/Asthma: Continuing home Dulera  with formulary alternative (Breo Ellipta ) and albuterol  2.5 mg as needed.   Anemia of chronic disease: Hgb 11.6. Smoking Hx: Smoked 1/4 ppd, and has been smoking since 53 yo. Started Nicorette  gum since she has had a reaction to the patch before HLD: Discontinued Crestor  20 mg daily  Bipolar disorder/anxiety/depression: Patient not taking Abilify , Atarax , or sertraline  as listed in her home medications.   FEN/GI: Regular Diet PPx: None Dispo: Home w/ hospice  Subjective:  Patient is frustrated this morning since she hasn't been getting her pain medicine as scheduled. Discussed that we are working with palliative to titrate her medications adequately, for which she was grateful. She also expresses being constipated.   Objective: Temp:  [97.8 F (36.6 C)-98.3 F (36.8 C)] 98.3 F (36.8 C) (06/12 0815) Pulse Rate:  [91-94] 91 (06/12 0815) Resp:  [16-18] 18 (06/12 0815) BP: (97-105)/(75-77) 105/77 (06/12 0815) SpO2:  [92 %-96 %] 96 % (06/12 0815) Physical Exam: General: Awake and Alert in NAD HEENT: NCAT. Sclera anicteric.  No rhinorrhea. Cardiovascular: RRR. No M/R/G Respiratory: CTAB, normal WOB on RA. No wheezing, crackles, rhonchi, or diminished breath sounds. Abdomen: Soft, non-tender, non-distended. Bowel sounds  normoactive Extremities: Able to move all extremities. No BLE edema, no deformities or significant joint findings. Skin: Warm and dry. No abrasions or rashes noted. Neuro: A&Ox3. No focal neurological deficits.  Laboratory: Most recent CBC Lab Results  Component Value Date   WBC 7.0 11/14/2023   HGB 11.6 (L) 11/14/2023   HCT 36.3 11/14/2023   MCV 86.0 11/14/2023   PLT 172 11/14/2023   Most recent BMP    Latest Ref Rng & Units 11/16/2023    3:56 PM  BMP  Glucose 70 - 99 mg/dL 409   BUN 6 - 20 mg/dL 25   Creatinine 8.11 - 1.00 mg/dL 9.14   Sodium 782 - 956 mmol/L 137   Potassium 3.5 - 5.1 mmol/L 4.2   Chloride 98 - 111 mmol/L 96   CO2 22 - 32 mmol/L 27   Calcium  8.9 - 10.3 mg/dL 8.2     Imaging/Diagnostic Tests: No new imaging.  Clyda Dark, DO 11/20/2023, 1:48 PM  PGY-1, [facility] FPTS Intern pager: [phone], text pages welcome Secure chat group [facility] Teaching Service

## 2023-11-20 NOTE — Progress Notes (Signed)
 Daily Progress Note   Patient Name: [name] [name]       Date: 11/20/2023 DOB: [birth year]  Age: 53 y.o. MRN#: [id number] Attending Physician: Candee Cha, MD Primary Care Physician: [facility] Admit Date: 11/14/2023  Reason for Consultation/Follow-up: Establishing goals of care  Length of Stay: 6  Current Medications: Scheduled Meds:   acetaminophen   650 mg Oral Q6H   fentaNYL   1 patch Transdermal Q72H   fluticasone  furoate-vilanterol  1 puff Inhalation Daily   furosemide   40 mg Oral BID   lidocaine   1 patch Transdermal Q24H   polyethylene glycol  17 g Oral BID   sertraline   25 mg Oral Daily    Continuous Infusions:   PRN Meds: acetaminophen  **OR** acetaminophen , albuterol , alum & mag hydroxide-simeth, HYDROmorphone , LORazepam  **OR** [DISCONTINUED] LORazepam  **OR** [DISCONTINUED] LORazepam , nicotine  polacrilex, ondansetron , polyethylene glycol, prochlorperazine , senna  Physical Exam Vitals reviewed.  Constitutional:      General: She is not in acute distress. HENT:     Head: Normocephalic and atraumatic.  Pulmonary:     Effort: Pulmonary effort is normal.   Skin:    General: Skin is warm and dry.   Neurological:     Mental Status: She is alert.   Psychiatric:        Mood and Affect: Mood normal.        Behavior: Behavior normal.             Vital Signs: BP 105/77 (BP Location: Right Arm)   Pulse 91   Temp 98.3 F (36.8 C)   Resp 18   Ht 5' 4 (1.626 m)   Wt 64.9 kg   LMP  (LMP Unknown) Comment: Pt is poor historian  SpO2 96%   BMI 24.56 kg/m  SpO2: SpO2: 96 % O2 Device: O2 Device: Room Air    Patient Active Problem List   Diagnosis Date Noted   Chronic health problem 11/14/2023   Goals of care, counseling/discussion 07/23/2023    Acute exacerbation of congestive heart failure (HCC) 07/21/2023   GAD (generalized anxiety disorder) 07/18/2023   Acute on chronic combined systolic (congestive) and diastolic (congestive) heart failure (HCC) 07/18/2023   Bipolar 1 disorder (HCC) 07/06/2023   Chronic pain syndrome 05/25/2023   Heart failure (HCC) 05/23/2023   CHF (congestive heart failure) (HCC) 05/22/2023  Hypomagnesemia 05/22/2023   Severe mitral regurgitation 09/17/2022   CKD stage 3a, GFR 45-59 ml/min (HCC) 09/17/2022   Type 2 diabetes mellitus with hyperglycemia (HCC) 08/30/2022   Hyperthyroidism 08/29/2022   AKI (acute kidney injury) (HCC) 08/21/2022   COPD (chronic obstructive pulmonary disease) (HCC) 08/04/2022   Chronic systolic CHF (congestive heart failure) (HCC) 08/03/2022   Acute on chronic systolic CHF (congestive heart failure) (HCC) 05/15/2022   Overdose, intentional self-harm, initial encounter (HCC) 12/13/2018   DM2 (diabetes mellitus, type 2) (HCC) 01/03/2018   HTN (hypertension) 08/28/2015    Palliative Care Assessment & Plan   Patient Profile: 53 y.o. female  with past medical history of underlying bipolar disorder, anxiety disorder, ongoing tobacco use and underlying COPD, hypertension, HFrEF secondary to severe mitral regurgitation that is not amenable to repair procedure, hypothyroidism, CKD stage IIIb admitted on 11/14/2023 with chest pain and shortness of breath.   She was discharged from home hospice 11/09/23.    While in the ED the patient shared she would like to treat the treatable and be admitted and then return home with hospice after she was feeling better.  Today's Discussion: Chart reviewed. Patient received 26 mg of oral dilaudid  over the last 24 hours. Patient lying in bed. She looks comfortable and reports her pain is okay right now. Her pain gets as high as a 10/10 and as low as a 3-4/10. We discussed increasing her fentanyl  patch dose today. The hope is her pain will be better  managed without as many ups and downs. She is agreeable to this plan.  We discussed the plan for her to discharge home with hospice. She has been accepted by [facility] for home hospice services. She is looking forward to discharging home soon.  Encouraged patient to call PMT with questions or concerns. PMT will continue to follow.   Recommendations/Plan: DNR Continue comfort-focused care Plan to discharge home once pain regimen is optimized Accepted by [facility] for home hospice services Please note - Outpatient palliative does not provide pain management services or prescribe opioid refills PMT will follow-up tomorrow  Symptom Management: Fentanyl  patch changed to 100 mcg/hr every 72 hours  - will take 24 hours to reach therapeutic levels Continue oral dilaudid  4 mg every 3 hours as needed for breakthrough pain Ativan  1 mg po every 4 hours prn for anxiety/seizure/sleep Compazine  5 mg IV every 6 hours prn nausea Bowel regimen: Miralax  17 gm daily and prn for constipation Senna 1 tablet at bedtime prn constipation   Code Status:    Code Status Orders  (From admission, onward)           Start     Ordered   11/16/23 1345  Do not attempt resuscitation (DNR) - Comfort care  Continuous       Question Answer Comment  If patient has no pulse and is not breathing Do Not Attempt Resuscitation   In Pre-Arrest Conditions (Patient Is Breathing and Has a Pulse) Provide comfort measures. Relieve any mechanical airway obstruction. Avoid transfer unless required for comfort.   Consent: Discussion documented in EHR or advanced directives reviewed      11/16/23 1348           Extensive chart review has been completed prior to seeing the patient including labs, vital signs, imaging, progress/consult notes, orders, medications, and available advance directive documents.  Care plan was discussed with Dr. Randeen Busman and bedside RN  Time spent: 50 minutes  Thank you for allowing the  Palliative Medicine  Team to assist in the care of this patient.    Daina Drum, NP  Please contact Palliative Medicine Team phone at [phone] for questions and concerns.

## 2023-11-20 NOTE — Assessment & Plan Note (Signed)
 A1c 8.25 months ago.  Patient presented with AG of 16 and sugars increased to 600 on CBG.  Patient was placed on Endotool with adjustments to her fluids to be mindful of her HFrEF.  Sugars downtrended, and patient was put on Semglee  8 units daily with moderate SSI, which was discontinued d/t comfort care. - Discontinued CBG checks per patient preference

## 2023-11-21 DIAGNOSIS — Z515 Encounter for palliative care: Secondary | ICD-10-CM | POA: Diagnosis not present

## 2023-11-21 DIAGNOSIS — I509 Heart failure, unspecified: Secondary | ICD-10-CM | POA: Diagnosis not present

## 2023-11-21 DIAGNOSIS — I5023 Acute on chronic systolic (congestive) heart failure: Secondary | ICD-10-CM | POA: Diagnosis not present

## 2023-11-21 MED ORDER — FUROSEMIDE 40 MG PO TABS
60.0000 mg | ORAL_TABLET | Freq: Once | ORAL | Status: AC
  Administered 2023-11-21: 60 mg via ORAL
  Filled 2023-11-21: qty 1

## 2023-11-21 MED ORDER — FUROSEMIDE 40 MG PO TABS
60.0000 mg | ORAL_TABLET | Freq: Two times a day (BID) | ORAL | Status: DC
  Administered 2023-11-21 – 2023-11-26 (×9): 60 mg via ORAL
  Filled 2023-11-21 (×10): qty 1

## 2023-11-21 MED ORDER — HYDROCORTISONE 0.5 % EX CREA
TOPICAL_CREAM | Freq: Four times a day (QID) | CUTANEOUS | Status: DC | PRN
  Filled 2023-11-21: qty 28.35

## 2023-11-21 NOTE — Discharge Summary (Addendum)
 Family Medicine Teaching [facility] Discharge Summary  Patient name: [name] [name] Medical record number: [id number] Date of birth: 10/27/1970 Age: 53 y.o. Gender: female Date of Admission: 11/14/2023  Date of Discharge: 11/26/23 Admitting Physician: [name] Melena, DO  Primary Care Provider: [facility] Consultants: Palliative  Indication for Hospitalization: Dyspnea  Discharge Diagnoses/Problem List:  Principal Problem for Admission: Acute exacerbation of CHF Other Problems addressed during stay:  Principal Problem:   Acute exacerbation of congestive heart failure (HCC) Active Problems:   Type 2 diabetes mellitus with hyperglycemia (HCC)   AKI (acute kidney injury) (HCC)   Goals of care, counseling/discussion   Chronic health problem  Brief Hospital Course:  [name] [name] is a 53 y.o.female with a history of HFrEF, severe MR, COPD, bipolar 1 disorder who was admitted to the [facility] at [facility] for acute on chronic CHF exacerbation. His hospital course is detailed below:  Acute on chronic CHF exacerbation Patient presented with dyspnea, orthopnea, and chest pain.  BNP elevated to 2090 and elevated JVD on exam.  Patient has had numerous admissions for CHF exacerbations in the past with having terminal HF since she is unable to tolerate GDMT with her hypotension.  Patient has been recently on home hospice, but was recently discharged and feels like her symptoms are uncontrolled so she was amenable to admission for symptomatic management.  Palliative care on board and has already discussed her goals which include returning home with hospice, she transitioned to comfort care during her hospitalization. Diuresed patient as able, with symptomatic improvement prior to discharge.  Pain medications titrated by palliative to establish pain regimen for home hospice.  Will continue Lasix  60 mg BID for dyspnea and comfort.   T2DM A1c 8.25 months ago.   Patient presented with AG of 16 and sugars increased to 600 on CBG.  Patient was placed on Endotool with adjustments to her fluids to be mindful of her HFrEF.  Sugars downtrended, and patient was put on Semglee  with moderate SSI and nighttime correction as needed, but this was discontinued after she became comfort care.  Hypotension Continued home Midodrine  10 mg TID.  Discontinued once comfort care.   Chronic Pain  Comfort Measures Patient reportedly on Oxycodone  15 mg q6h at home since she was on home with hospice.  Transitioned to comfort care during admission.  Pain regimen at discharge included: Tylenol  650 mg QID, Fentanyl  patch 100 mcg/hr every 72 hours, Dilaudid  4 mg q3h prn for breakthrough pain. Ativan  1 mg PO q4h prn for anxiety/sleep.  Bowel regimen includes: Miralax  17 g BID and Senna 8.6 mg BID.   Other chronic conditions were medically managed with home medications and formulary alternatives as necessary (Hypotension, COPD/Asthma, Smoking Hx, HLD, Bipolar Disorder/Anxiety/Depression)  PCP Follow-up Recommendations: Radiologist recommended repeat CT in 6 months d/t mild mediastinal and bilateral hilar adenopathy noted on CTPE, however since patient is comfort care this will have to be a further discussion. Titrate pain meds as needed.  Continue discussing goals of care.   Disposition: Home w/ hospice  Discharge Condition: Stable  Discharge Exam:  Vitals:   11/25/23 0434 11/25/23 0749  BP: 106/66 107/73  Pulse: (!) 102 100  Resp: 19   Temp: 98.6 F (37 C)   SpO2: 92% 92%   General: Alert, pleasant woman, chronically ill-appearing.  NAD. HEENT: NCAT. MMM. Cardiovascular: RRR Respiratory: Normal WOB on RA.  Ext: Moves all ext spontaneously  Significant Procedures: none  Significant Labs and Imaging:  Latest Ref Rng & Units 11/16/2023    3:56 PM 11/16/2023    3:11 AM 11/15/2023    4:00 AM  CMP  Glucose 70 - 99 mg/dL 649  722  851   BUN 6 - 20 mg/dL 25  25  19     Creatinine 0.44 - 1.00 mg/dL 8.26  8.34  8.84   Sodium 135 - 145 mmol/L 137  137  133   Potassium 3.5 - 5.1 mmol/L 4.2  5.3  3.8   Chloride 98 - 111 mmol/L 96  99  98   CO2 22 - 32 mmol/L 27  26  25    Calcium  8.9 - 10.3 mg/dL 8.2  8.6  8.1        Latest Ref Rng & Units 11/14/2023    6:39 AM 08/07/2023    3:26 AM 08/06/2023    2:52 PM  CBC  WBC 4.0 - 10.5 K/uL 7.0  9.1  9.4   Hemoglobin 12.0 - 15.0 g/dL 88.3  86.7  88.3   Hematocrit 36.0 - 46.0 % 36.3  43.1  37.5   Platelets 150 - 400 K/uL 172  225  243     CXR Mild hazy asymmetric increase opacities over the right lower lung may reflect early infiltrate versus atypical infection.  CTA PE No evidence of pulmonary embolus.   Cardiomegaly.   Opacities in the lower lobes, favor atelectasis in the left lower lobe. Cannot exclude pneumonia in the right lower lobe.   Mild mediastinal and bilateral hilar adenopathy, presumably reactive. This could be followed with repeat CT in 6 months.   Aortic Atherosclerosis (ICD10-I70.0).  Results/Tests Pending at Time of Discharge: none  Discharge Medications:  Allergies as of 11/25/2023       Reactions   Tramadol  Nausea Only, Rash   Wellbutrin [bupropion] Other (See Comments)   Behavioral changes (aggression, irritability)   Benadryl  [diphenhydramine ] Other (See Comments)   Decreases motor skills and increases anxiety   Lactose Intolerance (gi) Diarrhea   Nicotine  Rash   Patient states she is allergic to the Nicotine  patch and they cause her to break out in a rash.  She states she can smoke cigarettes.        Medication List     STOP taking these medications    bumetanide  1 MG tablet Commonly known as: BUMEX    busPIRone  15 MG tablet Commonly known as: BUSPAR    Dulera  200-5 MCG/ACT Aero Generic drug: mometasone -formoterol  Replaced by: fluticasone  furoate-vilanterol 200-25 MCG/ACT Aepb   hydrOXYzine  25 MG tablet Commonly known as: ATARAX    Insulin  Pen Needle 32G X 4 MM  Misc   metFORMIN  1000 MG tablet Commonly known as: GLUCOPHAGE    midodrine  10 MG tablet Commonly known as: PROAMATINE    OneTouch Delica Lancets 33G Misc   OneTouch Delica Plus Lancing Misc   OneTouch Verio test strip Generic drug: glucose blood   oxyCODONE  5 MG immediate release tablet Commonly known as: Oxy IR/ROXICODONE    rosuvastatin  20 MG tablet Commonly known as: CRESTOR    Toujeo  SoloStar 300 UNIT/ML Solostar Pen Generic drug: insulin  glargine (1 Unit Dial )       TAKE these medications    acetaminophen  325 MG tablet Commonly known as: TYLENOL  Take 2 tablets (650 mg total) by mouth every 6 (six) hours.   alum & mag hydroxide-simeth 200-200-20 MG/5ML suspension Commonly known as: MAALOX/MYLANTA Take 15 mLs by mouth 2 (two) times daily as needed for indigestion or heartburn.   fentaNYL  100 MCG/HR Commonly known as:  DURAGESIC  Place 1 patch onto the skin every 3 (three) days.   fluticasone  furoate-vilanterol 200-25 MCG/ACT Aepb Commonly known as: BREO ELLIPTA  Inhale 1 puff into the lungs daily. Replaces: Dulera  200-5 MCG/ACT Aero   furosemide  40 MG tablet Commonly known as: Lasix  Take 1 tablet (40 mg total) by mouth daily as needed for fluid or edema.   furosemide  20 MG tablet Commonly known as: LASIX  Take 3 tablets (60 mg total) by mouth 2 (two) times daily.   hydrocortisone  cream 0.5 % Apply topically 4 (four) times daily as needed for itching.   HYDROmorphone  4 MG tablet Commonly known as: DILAUDID  Take 1 tablet (4 mg total) by mouth every 3 (three) hours as needed (breakthrough pain).   ipratropium-albuterol  0.5-2.5 (3) MG/3ML Soln Commonly known as: DUONEB inhale 3 mLs by nebulization every 6 (six) hours as needed. What changed: reasons to take this   lidocaine  5 % Commonly known as: LIDODERM  Place 1 patch onto the skin daily. Remove & Discard patch within 12 hours or as directed by MD   LORazepam  1 MG tablet Commonly known as: ATIVAN  Take 1  tablet (1 mg total) by mouth every 4 (four) hours as needed for anxiety, seizure or sleep.   ondansetron  4 MG disintegrating tablet Commonly known as: ZOFRAN -ODT Take 1 tablet (4 mg total) by mouth every 8 (eight) hours as needed for nausea or vomiting.   pantoprazole  40 MG tablet Commonly known as: PROTONIX  Take 1 tablet (40 mg total) by mouth daily.   polyethylene glycol powder 17 GM/SCOOP powder Commonly known as: GLYCOLAX /MIRALAX  Take 17 g by mouth 2 (two) times daily.   polyethylene glycol 17 g packet Commonly known as: MIRALAX  / GLYCOLAX  Take 17 g by mouth daily as needed for mild constipation, moderate constipation or severe constipation (if remain constipated).   senna 8.6 MG Tabs tablet Commonly known as: SENOKOT Take 1 tablet (8.6 mg total) by mouth 2 (two) times daily.   sertraline  25 MG tablet Commonly known as: ZOLOFT  Take 1 tablet (25 mg total) by mouth daily. What changed:  medication strength how much to take   Ventolin  HFA 108 (90 Base) MCG/ACT inhaler Generic drug: albuterol  Inhale 2 puffs into the lungs every 6 (six) hours as needed for wheezing or shortness of breath.         Discharge Instructions: Please refer to Patient Instructions section of EMR for full details.  Patient was counseled important signs and symptoms that should prompt return to medical care, changes in medications, dietary instructions, activity restrictions, and follow up appointments.   Follow-Up Appointments:  Follow-up Information     Cyndi Shaver, PA-C Follow up on 12/11/2023.   Specialty: Physician Assistant Why: 10:20 for new patient apt, bring medication list, insurance infor and ID . Contact information: [address] [place] [address] [phone]         [facility] Follow up.   Specialty: Hospice and Palliative Medicine Why: Home Hospice Contact information: [address] [place]  [address] [phone]                 Lucie Pinal PGY-1, [facility]

## 2023-11-21 NOTE — Assessment & Plan Note (Signed)
 A1c 8.25 months ago.  Patient presented with AG of 16 and sugars increased to 600 on CBG.  Patient was placed on Endotool with adjustments to her fluids to be mindful of her HFrEF.  Sugars downtrended, and patient was put on Semglee  8 units daily with moderate SSI, which was discontinued d/t comfort care. - Discontinued CBG checks per patient preference

## 2023-11-21 NOTE — Progress Notes (Signed)
 [name] [name] [id number] - [facility] Liaison Note Received request from Transitions of Care Manager, for hospice services at home after discharge. Spoke with patient to initiate education related to hospice philosophy, services, and team approach to care. Patient verbalized understanding of information given. Per discussion, patient is unsure of discharge date at this time.   DME needs discussed. Patient reports the only DME she has is oxygen  equipment that is empty but has not been used in 4 years.  Patient denies needing any DME at this time. Discussed providing oxygen  equipment in case she needs it and she refuses this at this time.   Clinical information has been shared with and reviewed by hospice provider and [name] has been approved to receive hospice services after discharge.   Please send signed and completed DNR home with patient/family. Please provide prescriptions at discharge as needed to ensure ongoing symptom management.   [facility] information and contact numbers given to patient. Above information shared with Transitions of Care Manager. Please call with any questions or concerns.   Thank you for the opportunity to participate in this patient's care.   Madelene Schanz BSN, Charity fundraiser, OCN [employer] [phone]

## 2023-11-21 NOTE — Progress Notes (Addendum)
 Daily Progress Note   Patient Name: [name] [name]       Date: 11/21/2023 DOB: 03-17-1971  Age: 53 y.o. MRN#: [id number] Attending Physician: Candee Cha, MD Primary Care Physician: [facility] Admit Date: 11/14/2023  Reason for Consultation/Follow-up: Establishing goals of care  Length of Stay: 7  Current Medications: Scheduled Meds:   acetaminophen   650 mg Oral Q6H   fentaNYL   1 patch Transdermal Q72H   fluticasone  furoate-vilanterol  1 puff Inhalation Daily   furosemide   40 mg Oral BID   lidocaine   1 patch Transdermal Q24H   pantoprazole   40 mg Oral Daily   polyethylene glycol  17 g Oral BID   senna  1 tablet Oral BID   sertraline   25 mg Oral Daily    Continuous Infusions:   PRN Meds: acetaminophen  **OR** acetaminophen , albuterol , alum & mag hydroxide-simeth, HYDROmorphone , LORazepam  **OR** [DISCONTINUED] LORazepam  **OR** [DISCONTINUED] LORazepam , nicotine  polacrilex, ondansetron , polyethylene glycol, prochlorperazine   Physical Exam Vitals reviewed.  Constitutional:      General: She is not in acute distress. HENT:     Head: Normocephalic and atraumatic.  Pulmonary:     Effort: Pulmonary effort is normal.   Skin:    General: Skin is warm and dry.   Neurological:     Mental Status: She is alert.   Psychiatric:        Mood and Affect: Mood is anxious.        Behavior: Behavior normal.             Vital Signs: BP 105/77 (BP Location: Right Arm)   Pulse 94   Temp 98.2 F (36.8 C)   Resp 18   Ht 5' 4 (1.626 m)   Wt 64.9 kg   LMP  (LMP Unknown) Comment: Pt is poor historian  SpO2 94%   BMI 24.56 kg/m  SpO2: SpO2: 94 % O2 Device: O2 Device: Room Air    Patient Active Problem List   Diagnosis Date Noted   Chronic health problem  11/14/2023   Goals of care, counseling/discussion 07/23/2023   Acute exacerbation of congestive heart failure (HCC) 07/21/2023   GAD (generalized anxiety disorder) 07/18/2023   Acute on chronic combined systolic (congestive) and diastolic (congestive) heart failure (HCC) 07/18/2023   Bipolar 1 disorder (HCC) 07/06/2023   Chronic pain  syndrome 05/25/2023   Heart failure (HCC) 05/23/2023   CHF (congestive heart failure) (HCC) 05/22/2023   Hypomagnesemia 05/22/2023   Severe mitral regurgitation 09/17/2022   CKD stage 3a, GFR 45-59 ml/min (HCC) 09/17/2022   Type 2 diabetes mellitus with hyperglycemia (HCC) 08/30/2022   Hyperthyroidism 08/29/2022   AKI (acute kidney injury) (HCC) 08/21/2022   COPD (chronic obstructive pulmonary disease) (HCC) 08/04/2022   Chronic systolic CHF (congestive heart failure) (HCC) 08/03/2022   Acute on chronic systolic CHF (congestive heart failure) (HCC) 05/15/2022   Overdose, intentional self-harm, initial encounter (HCC) 12/13/2018   DM2 (diabetes mellitus, type 2) (HCC) 01/03/2018   HTN (hypertension) 08/28/2015    Palliative Care Assessment & Plan   Patient Profile: 53 y.o. female  with past medical history of underlying bipolar disorder, anxiety disorder, ongoing tobacco use and underlying COPD, hypertension, HFrEF secondary to severe mitral regurgitation that is not amenable to repair procedure, hypothyroidism, CKD stage IIIb admitted on 11/14/2023 with chest pain and shortness of breath.   She was discharged from home hospice 11/09/23.    While in the ED the patient shared she would like to treat the treatable and be admitted and then return home with hospice after she was feeling better.  Today's Discussion: Chart reviewed. Patient received 24 mg of oral dilaudid  over the last 24 hours. Patient sitting up in bed. She looks comfortable and reports her pain is much improved. We discussed that her fentanyl  patch is still working up to the therapeutic dose.  Patient shared she feels a little foggy today. I encouraged her to not take her prn dilaudid  on a schedule but to only take her prn dilaudid  when she required. She understands. We discuss that she should be discharging home with hospice soon-- maybe today. She shared her concern about her continued fluid retention-- told her I would share her concern with the team.   We discussed the plan for her to discharge home with hospice. She has been accepted by [facility] for home hospice services.Answered many questions about home hospice and the support she will be receiving at home. She is upset because she believes she lost her purse during this hospitalization. Asked floor secretary to investigate this. Patient became tearful and shared she lost her son to suicide some time ago. Emotional support and therapeutic listening provided. I shared with the patient that I think it will be good for her to go home to a familiar place and be back on her own schedule.  Encouraged patient to call PMT with questions or concerns. PMT will continue to follow.   Recommendations/Plan: DNR Continue comfort-focused care Accepted by [facility] for home hospice services Please note - Outpatient palliative does not provide pain management services or prescribe opioid refills PMT will follow-up tomorrow  Symptom Management: Fentanyl  patch 100 mcg/hr every 72 hours (started 11/20/23) Continue oral dilaudid  4 mg every 3 hours as needed for breakthrough pain Ativan  1 mg po every 4 hours prn for anxiety/seizure/sleep Compazine  5 mg IV every 6 hours prn nausea Bowel regimen: Miralax  17 gm daily and prn for constipation Senna 1 tablet at bedtime prn constipation   Code Status:    Code Status Orders  (From admission, onward)           Start     Ordered   11/16/23 1345  Do not attempt resuscitation (DNR) - Comfort care  Continuous       Question Answer Comment  If patient has no pulse and is not breathing  Do Not  Attempt Resuscitation   In Pre-Arrest Conditions (Patient Is Breathing and Has a Pulse) Provide comfort measures. Relieve any mechanical airway obstruction. Avoid transfer unless required for comfort.   Consent: Discussion documented in EHR or advanced directives reviewed      11/16/23 1348           Extensive chart review has been completed prior to seeing the patient including labs, vital signs, imaging, progress/consult notes, orders, medications, and available advance directive documents.  Care plan was discussed with Dr. Randeen Busman   Time spent: 50 minutes  Thank you for allowing the Palliative Medicine Team to assist in the care of this patient.    Daina Drum, NP  Please contact Palliative Medicine Team phone at [phone] for questions and concerns.

## 2023-11-21 NOTE — Discharge Instructions (Signed)
 Dear [name],  Thank you for letting us  participate in your care. You were hospitalized for shortness of breath and chest pain and diagnosed with Acute exacerbation of congestive heart failure (HCC). You were treated with Lasix  and pain medications to manage your symptoms once you decided you wanted to be comfort care.   POST-HOSPITAL & CARE INSTRUCTIONS Follow up with [facility] Follow up with [facility] to get your medications prescribed. Go to your follow up appointments (listed below)  DOCTOR'S APPOINTMENT   Future Appointments  Date Time Provider Department Center  12/11/2023 10:20 AM Trenton Frock, PA-C [facility]    Follow-up Information     Trenton Frock, PA-C Follow up on 12/11/2023.   Specialty: Physician Assistant Why: 10:20 for new patient apt, bring medication list, insurance infor and ID . Contact information: [address] [place] [address] [phone]         [facility] Follow up.   Specialty: Hospice and Palliative Medicine Why: Home Hospice Contact information: [address] [place]  [address] [phone]                Take care and be well!  Family Medicine Teaching Service Inpatient Team   [facility]  [address] [place], [place] [address] [phone]

## 2023-11-21 NOTE — Assessment & Plan Note (Addendum)
 BNP 2,090.  February 2025. EF 20-25%, global hypokinesis, mild LVH, severe MVR, IVC with > 50% respiratory variability.  After discussion with palliative patient would like to pursue comfort care since she has HF with severe MR which is terminal and is unable to tolerate GDMT.  Will continue to treat symptomatically at this time.  - Lasix  60 mg BID

## 2023-11-21 NOTE — Plan of Care (Signed)
  Problem: Pain Management: Goal: Satisfaction with pain management regimen will improve Outcome: Not Progressing

## 2023-11-21 NOTE — Assessment & Plan Note (Signed)
 Cr bumped from 1.15 > 1.73.  No longer checking BMPs due to transition to comfort care. - Avoid nephrotoxic agents

## 2023-11-21 NOTE — Assessment & Plan Note (Signed)
 COPD/Asthma: Continuing home Dulera  with formulary alternative (Breo Ellipta ) and albuterol  2.5 mg as needed.   Anemia of chronic disease: Hgb 11.6. Smoking Hx: Smoked 1/4 ppd, and has been smoking since 53 yo. Started Nicorette  gum since she has had a reaction to the patch before HLD: Discontinued Crestor  20 mg daily  Bipolar disorder/anxiety/depression: Patient not taking Abilify , Atarax , or sertraline  as listed in her home medications.

## 2023-11-21 NOTE — Plan of Care (Signed)

## 2023-11-21 NOTE — Progress Notes (Signed)
 Daily Progress Note Intern Pager: [phone]  Patient name: [name] [name] Medical record number: [id number] Date of birth: [birth year] Age: 53 y.o. Gender: female  Primary Care Provider: [facility] [place] [facility] Consultants: Palliative Code Status: DNR-comfort  Pt Overview and Major Events to Date:  6/6: Admitted; started on Endotool for hyperglycemia 6/7: Transition to Semglee  6/8: Comfort Care  Assessment and Plan: [name] [name] is a 53 year old female with PMH of HFrEF, severe MR, COPD, and bipolar 1 disorder who presented with dyspnea and chest pain.  She was initially admitted for acute on chronic congestive heart failure exacerbation which was treated with Lasix .  Cardiology was consulted but signed off due to her terminal HF and severe MR.  She was also treated for possible pneumonia with 1 dose of CTX and azithromycin , but was discontinued due to negative procalcitonin.  Patient is being diuresed for comfort and treated symptomatically since she is comfort care after discussion with palliative.  Palliative continues to titrate her pain regimen to provide adequate comfort. Assessment & Plan Acute exacerbation of congestive heart failure (HCC) BNP 2,090.  February 2025. EF 20-25%, global hypokinesis, mild LVH, severe MVR, IVC with > 50% respiratory variability.  After discussion with palliative patient would like to pursue comfort care since she has HF with severe MR which is terminal and is unable to tolerate GDMT.  Will continue to treat symptomatically at this time.  - Lasix  60 mg BID Goals of care, counseling/discussion Accepted by [facility] for home hospice services. - Pain reg: Tylenol  650 mg QID, Fentanyl  patch 100 mcg/hr every 72 hours, Dilaudid  4 mg q3h prn for breakthrough pain - Bowel reg: Miralax  17g BID and Senna BID prn at bedtime - Ativan  1 mg PO/IV mg q4h prn for anxiety/seizures/sleep - Compazine  5 mg IV q6h prn for nausea - Lidocaine  5% patch ordered -  Palliative on board, appreciate recommendations Type 2 diabetes mellitus with hyperglycemia (HCC) A1c 8.25 months ago.  Patient presented with AG of 16 and sugars increased to 600 on CBG.  Patient was placed on Endotool with adjustments to her fluids to be mindful of her HFrEF.  Sugars downtrended, and patient was put on Semglee  8 units daily with moderate SSI, which was discontinued d/t comfort care. - Discontinued CBG checks per patient preference AKI (acute kidney injury) (HCC) Cr bumped from 1.15 > 1.73.  No longer checking BMPs due to transition to comfort care. - Avoid nephrotoxic agents Chronic health problem COPD/Asthma: Continuing home Dulera  with formulary alternative (Breo Ellipta ) and albuterol  2.5 mg as needed.   Anemia of chronic disease: Hgb 11.6. Smoking Hx: Smoked 1/4 ppd, and has been smoking since 53 yo. Started Nicorette  gum since she has had a reaction to the patch before HLD: Discontinued Crestor  20 mg daily  Bipolar disorder/anxiety/depression: Patient not taking Abilify , Atarax , or sertraline  as listed in her home medications.   FEN/GI: Regular diet PPx: None Dispo:Home with home hospice  Subjective:  Patient is sitting up at bedside today and is feeling better today.   Objective: Temp:  [98 F (36.7 C)-98.2 F (36.8 C)] 98.2 F (36.8 C) (06/13 0610) Pulse Rate:  [94-98] 94 (06/13 0830) Resp:  [18] 18 (06/13 0830) BP: (105-135)/(77-90) 105/77 (06/13 0610) SpO2:  [94 %-96 %] 94 % (06/13 0610) Physical Exam: General: Awake and Alert in NAD HEENT: NCAT. Sclera anicteric. No rhinorrhea. Cardiovascular: RRR. No M/R/G. Mild JVD noted. Respiratory: CTAB, normal WOB on RA. No wheezing, crackles, rhonchi, or diminished breath sounds.  Abdomen: Soft, non-tender, non-distended. Bowel sounds normoactive Extremities: Able to move all extremities. No BLE edema, no deformities or significant joint findings. Skin: Warm and dry. No abrasions or rashes noted. Neuro: A&Ox3.  No focal neurological deficits.  Laboratory: Most recent CBC Lab Results  Component Value Date   WBC 7.0 11/14/2023   HGB 11.6 (L) 11/14/2023   HCT 36.3 11/14/2023   MCV 86.0 11/14/2023   PLT 172 11/14/2023   Most recent BMP    Latest Ref Rng & Units 11/16/2023    3:56 PM  BMP  Glucose 70 - 99 mg/dL 045   BUN 6 - 20 mg/dL 25   Creatinine 4.09 - 1.00 mg/dL 8.11   Sodium 914 - 782 mmol/L 137   Potassium 3.5 - 5.1 mmol/L 4.2   Chloride 98 - 111 mmol/L 96   CO2 22 - 32 mmol/L 27   Calcium  8.9 - 10.3 mg/dL 8.2     Imaging/Diagnostic Tests: No new imaging.  [name] Dark, DO 11/21/2023, 12:07 PM  PGY-1, [facility] Health Family Medicine FPTS Intern pager: [phone], text pages welcome Secure chat group [facility] Teaching Service

## 2023-11-21 NOTE — Assessment & Plan Note (Signed)
 Accepted by [facility] for home hospice services. - Pain reg: Tylenol  650 mg QID, Fentanyl  patch 100 mcg/hr every 72 hours, Dilaudid  4 mg q3h prn for breakthrough pain - Bowel reg: Miralax  17g BID and Senna BID prn at bedtime - Ativan  1 mg PO/IV mg q4h prn for anxiety/seizures/sleep - Compazine  5 mg IV q6h prn for nausea - Lidocaine  5% patch ordered - Palliative on board, appreciate recommendations

## 2023-11-21 NOTE — TOC Progression Note (Signed)
 Transition of Care [facility]) - Progression Note    Patient Details  Name: [name] [name] MRN: [id number] Date of Birth: November 13, 1970  Transition of Care [facility]) CM/SW Contact  Mirian Ames Doloris Freund, RN Phone Number: 11/21/2023, 2:22 PM  Clinical Narrative:     Case Manager spoke with [facility] Liaison, Briana Campbell regarding medications. Pslliative care does not provide meds for patient. Prescriptions need to be sent to her pharmacy or TOC.   Expected Discharge Plan: Home w Hospice Care Barriers to Discharge: Continued Medical Work up  Expected Discharge Plan and Services In-house Referral: NA Discharge Planning Services: CM Consult Post Acute Care Choice: Hospice Living arrangements for the past 2 months: Single Family Home                           HH Arranged: RN [name] Agency: [facility] Date HH Agency Contacted: 11/17/23 Time HH Agency Contacted: 1122 Representative spoke with at [facility] Agency: Cheri   Social Determinants of Health (SDOH) Interventions SDOH Screenings   Food Insecurity: Food Insecurity Present (11/16/2023)  Housing: High Risk (11/16/2023)  Transportation Needs: No Transportation Needs (11/15/2023)  Utilities: Not At Risk (11/15/2023)  Alcohol Screen: Low Risk  (02/12/2023)  Depression (PHQ2-9): High Risk (09/30/2022)  Financial Resource Strain: High Risk (09/30/2022)  Social Connections: Moderately Isolated (11/15/2023)  Stress: No Stress Concern Present (01/07/2022)   Received from [facility]  Tobacco Use: High Risk (11/14/2023)    Readmission Risk Interventions    07/23/2023   12:02 PM 07/10/2023    1:21 PM 02/12/2023    5:00 PM  Readmission Risk Prevention Plan  Transportation Screening Complete Complete Complete  Medication Review [employer]) Complete Complete   PCP or Specialist appointment within 3-5 days of discharge Complete Complete   HRI or Home Care Consult Complete Complete   SW Recovery Care/Counseling Consult Complete Patient  refused   Palliative Care Screening Not Applicable Not Applicable   Skilled Nursing Facility Not Applicable Not Applicable

## 2023-11-22 ENCOUNTER — Other Ambulatory Visit (HOSPITAL_COMMUNITY): Payer: Self-pay

## 2023-11-22 DIAGNOSIS — Z7189 Other specified counseling: Secondary | ICD-10-CM | POA: Diagnosis not present

## 2023-11-22 DIAGNOSIS — I5023 Acute on chronic systolic (congestive) heart failure: Secondary | ICD-10-CM | POA: Diagnosis not present

## 2023-11-22 DIAGNOSIS — Z515 Encounter for palliative care: Secondary | ICD-10-CM | POA: Diagnosis not present

## 2023-11-22 MED ORDER — HYDROCORTISONE 0.5 % EX CREA
TOPICAL_CREAM | Freq: Four times a day (QID) | CUTANEOUS | 0 refills | Status: DC | PRN
Start: 2023-11-22 — End: 2023-12-15
  Filled 2023-11-22: qty 30, fill #0

## 2023-11-22 MED ORDER — FUROSEMIDE 20 MG PO TABS
60.0000 mg | ORAL_TABLET | Freq: Two times a day (BID) | ORAL | 0 refills | Status: DC
  Filled 2023-11-22: qty 60, 10d supply, fill #0

## 2023-11-22 MED ORDER — SENNA 8.6 MG PO TABS
1.0000 | ORAL_TABLET | Freq: Two times a day (BID) | ORAL | 0 refills | Status: DC
  Filled 2023-11-22: qty 120, 60d supply, fill #0

## 2023-11-22 MED ORDER — PANTOPRAZOLE SODIUM 40 MG PO TBEC
40.0000 mg | DELAYED_RELEASE_TABLET | Freq: Every day | ORAL | 0 refills | Status: DC
  Filled 2023-11-22: qty 30, 30d supply, fill #0

## 2023-11-22 MED ORDER — ACETAMINOPHEN 325 MG PO TABS
650.0000 mg | ORAL_TABLET | Freq: Four times a day (QID) | ORAL | 0 refills | Status: DC
  Filled 2023-11-22: qty 240, 30d supply, fill #0

## 2023-11-22 MED ORDER — FENTANYL 100 MCG/HR TD PT72
1.0000 | MEDICATED_PATCH | TRANSDERMAL | 0 refills | Status: DC
  Filled 2023-11-22: qty 5, 28d supply, fill #0
  Filled 2023-11-24: qty 5, 15d supply, fill #0

## 2023-11-22 MED ORDER — ALUM & MAG HYDROXIDE-SIMETH 200-200-20 MG/5ML PO SUSP
15.0000 mL | Freq: Two times a day (BID) | ORAL | 0 refills | Status: DC | PRN
  Filled 2023-11-22: qty 355, 12d supply, fill #0

## 2023-11-22 MED ORDER — SERTRALINE HCL 25 MG PO TABS
25.0000 mg | ORAL_TABLET | Freq: Every day | ORAL | 0 refills | Status: DC
  Filled 2023-11-22: qty 30, 30d supply, fill #0

## 2023-11-22 MED ORDER — FUROSEMIDE 40 MG PO TABS
40.0000 mg | ORAL_TABLET | Freq: Every day | ORAL | 11 refills | Status: DC | PRN
  Filled 2023-11-22: qty 30, 30d supply, fill #0

## 2023-11-22 MED ORDER — FLUTICASONE FUROATE-VILANTEROL 200-25 MCG/ACT IN AEPB
1.0000 | INHALATION_SPRAY | Freq: Every day | RESPIRATORY_TRACT | 0 refills | Status: DC
  Filled 2023-11-22 – 2023-11-25 (×2): qty 60, 60d supply, fill #0

## 2023-11-22 MED ORDER — LIDOCAINE 5 % EX PTCH
1.0000 | MEDICATED_PATCH | CUTANEOUS | 0 refills | Status: DC
  Filled 2023-11-22 (×2): qty 30, 30d supply, fill #0

## 2023-11-22 MED ORDER — FENTANYL 100 MCG/HR TD PT72
1.0000 | MEDICATED_PATCH | TRANSDERMAL | 0 refills | Status: DC
  Filled 2023-11-22: qty 5, 15d supply, fill #0

## 2023-11-22 MED ORDER — HYDROMORPHONE HCL 4 MG PO TABS
4.0000 mg | ORAL_TABLET | ORAL | 0 refills | Status: DC | PRN
  Filled 2023-11-22: qty 30, 4d supply, fill #0
  Filled 2023-11-24: qty 30, 5d supply, fill #0

## 2023-11-22 MED ORDER — LORAZEPAM 1 MG PO TABS
1.0000 mg | ORAL_TABLET | ORAL | 0 refills | Status: DC | PRN
  Filled 2023-11-22 (×2): qty 30, 5d supply, fill #0

## 2023-11-22 MED ORDER — POLYETHYLENE GLYCOL 3350 17 G PO PACK
17.0000 g | PACK | Freq: Every day | ORAL | 0 refills | Status: DC | PRN
  Filled 2023-11-22: qty 14, 14d supply, fill #0

## 2023-11-22 MED ORDER — POLYETHYLENE GLYCOL 3350 17 GM/SCOOP PO POWD
17.0000 g | Freq: Two times a day (BID) | ORAL | 0 refills | Status: DC
  Filled 2023-11-22: qty 238, 7d supply, fill #0

## 2023-11-22 MED ORDER — ONDANSETRON 4 MG PO TBDP
4.0000 mg | ORAL_TABLET | Freq: Three times a day (TID) | ORAL | 0 refills | Status: DC | PRN
Start: 2023-11-22 — End: 2023-12-15
  Filled 2023-11-22: qty 20, 7d supply, fill #0

## 2023-11-22 NOTE — Progress Notes (Signed)
     Daily Progress Note Intern Pager: [phone]  Patient name: [name] [name] Medical record number: [id number] Date of birth: [birth year] Age: 53 y.o. Gender: female  Primary Care Provider: [facility] Consultants: Palliative Code Status: DNR comfort  Pt Overview and Major Events to Date:  6/6 Admitted 6/8 Transition to comfort care  Assessment and Plan: LF is a 53 year old F admitted for acute on chronic CHF exacerbation with severe mitral regurg; who is now transition to comfort care for terminal heart failure. Pt is medically stable for DC home w/ home hospice; pending prior auth of medications to safely discharge home.   Pertinent PMH/PSH includes HFrEF, severe MR, COPD, bipolar 1 disorder.  Assessment & Plan Acute exacerbation of congestive heart failure (HCC) Reports improved UOP w/ current lasix  dose. - Lasix  60mg  BID Goals of care, counseling/discussion - Pain reg: Tylenol  650 mg QID, Fentanyl  patch 100 mcg/hr every 72 hours, Dilaudid  4 mg q3h prn for breakthrough pain - Bowel reg: Miralax  17g BID and Senna BID prn at bedtime - Ativan  1 mg PO/IV mg q4h prn for anxiety/seizures/sleep - Compazine  5 mg IV q6h prn for nausea - Lidocaine  5% patch ordered - Palliative on board, appreciate recommendations  FEN/GI: Regular PPx: None Dispo: Home with hospice  Subjective:  Reports improving UOP w/ lasix .   Objective: Temp:  [97.9 F (36.6 C)-98.2 F (36.8 C)] 98.2 F (36.8 C) (06/14 2031) Pulse Rate:  [95-99] 99 (06/14 2031) Resp:  [16-20] 17 (06/14 2031) BP: (109-122)/(68-81) 122/81 (06/14 2031) SpO2:  [92 %-97 %] 97 % (06/14 2031) Physical Exam: General: Alert, pleasant woman, chronically ill-appearing.  NAD. HEENT: NCAT. MMM. Cardiovascular: RRR Respiratory: Normal WOB on RA.  Ext: Moves all ext spontaneously  Laboratory: Most recent CBC Lab Results  Component Value Date   WBC 7.0 11/14/2023   HGB 11.6 (L) 11/14/2023   HCT 36.3 11/14/2023    MCV 86.0 11/14/2023   PLT 172 11/14/2023   Most recent BMP    Latest Ref Rng & Units 11/16/2023    3:56 PM  BMP  Glucose 70 - 99 mg/dL 630   BUN 6 - 20 mg/dL 25   Creatinine 1.60 - 1.00 mg/dL 1.09   Sodium 323 - 557 mmol/L 137   Potassium 3.5 - 5.1 mmol/L 4.2   Chloride 98 - 111 mmol/L 96   CO2 22 - 32 mmol/L 27   Calcium  8.9 - 10.3 mg/dL 8.2      Albin Huh, MD 11/22/2023, 11:08 PM  PGY-2, [place] [facility] FPTS Intern pager: [phone], text pages welcome Secure chat group [facility] Teaching Service

## 2023-11-22 NOTE — Assessment & Plan Note (Signed)
 Medically stable to discharge home w/ home hospice; pending PA approval for home medications before she can go home.      *** Patient to likely be discharged for home health hospice services today.  Continuing pain regimen and comfort regimen as below. - Pain reg: Tylenol  650 mg QID, Fentanyl  patch 100 mcg/hr every 72 hours, Dilaudid  4 mg q3h prn for breakthrough pain - Bowel reg: Miralax  17g BID and Senna BID prn at bedtime - Ativan  1 mg PO/IV mg q4h prn for anxiety/seizures/sleep - Compazine  5 mg IV q6h prn for nausea - Lidocaine  5% patch ordered - Palliative on board, appreciate recommendations

## 2023-11-22 NOTE — Plan of Care (Signed)
   Problem: Coping: Goal: Ability to adjust to condition or change in health will improve Outcome: Progressing   Problem: Pain Managment: Goal: General experience of comfort will improve and/or be controlled Outcome: Progressing

## 2023-11-22 NOTE — Plan of Care (Signed)
  Problem: Safety: Goal: Ability to remain free from injury will improve Outcome: Not Progressing   Problem: Pain Management: Goal: Satisfaction with pain management regimen will improve Outcome: Not Progressing

## 2023-11-22 NOTE — Assessment & Plan Note (Signed)
 Improved symptoms with Lasix  60 mg twice daily for comfort.  No lower extremity edema or respiratory compromise today. - Continue Lasix  60 mg twice daily

## 2023-11-22 NOTE — Progress Notes (Signed)
 Daily Progress Note   Patient Name: [name] [name]       Date: 11/22/2023 DOB: 01-18-1971  Age: 53 y.o. MRN#: [id number] Attending Physician: Candee Cha, MD Primary Care Physician: [facility] [place] [facility] Admit Date: 11/14/2023  Reason for Consultation/Follow-up: Establishing goals of care  Length of Stay: 8  Current Medications: Scheduled Meds:   acetaminophen   650 mg Oral Q6H   fentaNYL   1 patch Transdermal Q72H   fluticasone  furoate-vilanterol  1 puff Inhalation Daily   furosemide   60 mg Oral BID   lidocaine   1 patch Transdermal Q24H   pantoprazole   40 mg Oral Daily   polyethylene glycol  17 g Oral BID   senna  1 tablet Oral BID   sertraline   25 mg Oral Daily    Continuous Infusions:   PRN Meds: acetaminophen  **OR** acetaminophen , albuterol , alum & mag hydroxide-simeth, hydrocortisone  cream, HYDROmorphone , LORazepam  **OR** [DISCONTINUED] LORazepam  **OR** [DISCONTINUED] LORazepam , nicotine  polacrilex, ondansetron , polyethylene glycol, prochlorperazine   Physical Exam Vitals reviewed.  Constitutional:      General: She is awake. She is not in acute distress. HENT:     Head: Normocephalic and atraumatic.  Pulmonary:     Effort: Pulmonary effort is normal.   Skin:    General: Skin is warm and dry.   Psychiatric:        Behavior: Behavior normal.             Vital Signs: BP 115/71 (BP Location: Left Arm)   Pulse 95   Temp 97.9 F (36.6 C)   Resp 20   Ht 5' 4 (1.626 m)   Wt 64.9 kg   LMP  (LMP Unknown) Comment: Pt is poor historian  SpO2 92%   BMI 24.56 kg/m  SpO2: SpO2: 92 % O2 Device: O2 Device: Room Air    Patient Active Problem List   Diagnosis Date Noted   Chronic health problem 11/14/2023   Goals of care, counseling/discussion 07/23/2023    Acute exacerbation of congestive heart failure (HCC) 07/21/2023   GAD (generalized anxiety disorder) 07/18/2023   Acute on chronic combined systolic (congestive) and diastolic (congestive) heart failure (HCC) 07/18/2023   Bipolar 1 disorder (HCC) 07/06/2023   Chronic pain syndrome 05/25/2023   Heart failure (HCC) 05/23/2023   CHF (congestive heart failure) (HCC) 05/22/2023   Hypomagnesemia 05/22/2023  Severe mitral regurgitation 09/17/2022   CKD stage 3a, GFR 45-59 ml/min (HCC) 09/17/2022   Type 2 diabetes mellitus with hyperglycemia (HCC) 08/30/2022   Hyperthyroidism 08/29/2022   AKI (acute kidney injury) (HCC) 08/21/2022   COPD (chronic obstructive pulmonary disease) (HCC) 08/04/2022   Chronic systolic CHF (congestive heart failure) (HCC) 08/03/2022   Acute on chronic systolic CHF (congestive heart failure) (HCC) 05/15/2022   Overdose, intentional self-harm, initial encounter (HCC) 12/13/2018   DM2 (diabetes mellitus, type 2) (HCC) 01/03/2018   HTN (hypertension) 08/28/2015    Palliative Care Assessment & Plan   Patient Profile: 53 y.o. female  with past medical history of underlying bipolar disorder, anxiety disorder, ongoing tobacco use and underlying COPD, hypertension, HFrEF secondary to severe mitral regurgitation that is not amenable to repair procedure, hypothyroidism, CKD stage IIIb admitted on 11/14/2023 with chest pain and shortness of breath.   She was discharged from home hospice 11/09/23.    While in the ED the patient shared she would like to treat the treatable and be admitted and then return home with hospice after she was feeling better.  Today's Discussion: Chart reviewed. Patient received 20 mg of oral dilaudid  over the last 24 hours. She received as needed ativan  twice. Patient sitting up in bed eating breakfast. Today she has the blinds open in her room. She looks sleepy and reports she had trouble sleeping last night. Overall she reports her pain is much better  but currently it is a 9 out of 10.  She reports she has been more anxious and she had just asked nursing to bring an additional dose of ativan .  When I shared that she would likely discharge home today with hospice she asked if she could stay another day.  I shared that I believe she was medically stable for discharge. I also believe being in the home environment will be good for her.  Encouraged patient to call PMT with questions or concerns. PMT will continue to follow.   Recommendations/Plan: DNR Continue comfort-focused care Accepted by [facility] for home hospice services Please note - Outpatient palliative does not provide pain management services or prescribe opioid refills PMT will follow-up tomorrow  Symptom Management: Fentanyl  patch 100 mcg/hr every 72 hours (started 11/20/23) Continue oral dilaudid  4 mg every 3 hours as needed for breakthrough pain Ativan  1 mg po every 4 hours prn for anxiety/seizure/sleep Compazine  5 mg IV every 6 hours prn nausea Bowel regimen: Miralax  17 gm daily and prn for constipation Senna 1 tablet at bedtime prn constipation   Code Status:    Code Status Orders  (From admission, onward)           Start     Ordered   11/16/23 1345  Do not attempt resuscitation (DNR) - Comfort care  Continuous       Question Answer Comment  If patient has no pulse and is not breathing Do Not Attempt Resuscitation   In Pre-Arrest Conditions (Patient Is Breathing and Has a Pulse) Provide comfort measures. Relieve any mechanical airway obstruction. Avoid transfer unless required for comfort.   Consent: Discussion documented in EHR or advanced directives reviewed      11/16/23 1348           Extensive chart review has been completed prior to seeing the patient including labs, vital signs, imaging, progress/consult notes, orders, medications, and available advance directive documents.  Care plan was discussed with Dr. Randeen Busman   Time spent: 35  minutes  Thank you for  allowing the Palliative Medicine Team to assist in the care of this patient.    Daina Drum, NP  Please contact Palliative Medicine Team phone at [phone] for questions and concerns.

## 2023-11-22 NOTE — Assessment & Plan Note (Deleted)
 Home hospice services have been approved, home medications will need to be sent through [facility] or home pharmacy. - Pain reg: Tylenol  650 mg QID, Fentanyl  patch 100 mcg/hr every 72 hours, Dilaudid  4 mg q3h prn for breakthrough pain - Bowel reg: Miralax  17g BID and Senna BID prn at bedtime - Ativan  1 mg PO/IV mg q4h prn for anxiety/seizures/sleep - Compazine  5 mg IV q6h prn for nausea - Lidocaine  5% patch ordered - Palliative on board, appreciate recommendations

## 2023-11-22 NOTE — Progress Notes (Signed)
     Daily Progress Note Intern Pager: [phone]  Patient name: [name] [name] Medical record number: [id number] Date of birth: 03-05-1971 Age: 53 y.o. Gender: female  Primary Care Provider: [facility] [place] Medical Consultants: Palliative Code Status: DNR-comfort  Pt Overview and Major Events to Date:  6/6-admitted 6/8-comfort care  Assessment and Plan:  Patient is a 53-year-old female here for acute on chronic congestive heart failure exacerbation with severe mitral regurgitation.  Patient unable to tolerate long-term GDMT and ultimately went comfort care. Pertinent PMH/PSH includes HFrEF, severe MR, COPD, bipolar 1 disorder.  Assessment & Plan Acute exacerbation of congestive heart failure (HCC) Improved symptoms with Lasix  60 mg twice daily for comfort.  No lower extremity edema or respiratory compromise today. - Continue Lasix  60 mg twice daily Goals of care, counseling/discussion Patient to likely be discharged for home health hospice services today.  Continuing pain regimen and comfort regimen as below. - Pain reg: Tylenol  650 mg QID, Fentanyl  patch 100 mcg/hr every 72 hours, Dilaudid  4 mg q3h prn for breakthrough pain - Bowel reg: Miralax  17g BID and Senna BID prn at bedtime - Ativan  1 mg PO/IV mg q4h prn for anxiety/seizures/sleep - Compazine  5 mg IV q6h prn for nausea - Lidocaine  5% patch ordered - Palliative on board, appreciate recommendations  FEN/GI: Regular diet PPx: None Dispo:Home with hospice  Subjective:  Pain is better controlled.  She is okay with going home for further comfort.  Objective: Temp:  [97.9 F (36.6 C)-98.1 F (36.7 C)] 97.9 F (36.6 C) (06/14 0805) Pulse Rate:  [95] 95 (06/14 0805) Resp:  [16-20] 20 (06/14 0805) BP: (109-115)/(68-71) 115/71 (06/14 0805) SpO2:  [92 %-95 %] 92 % (06/14 0805) Physical Exam: General: Awake, no acute distress, sitting up in bed Cardiovascular: Regular rate and rhythm Respiratory: Clear to auscultation  bilaterally anteriorly Extremities: Moves all extremities grossly equally, no lower extremity edema  Laboratory: Most recent CBC Lab Results  Component Value Date   WBC 7.0 11/14/2023   HGB 11.6 (L) 11/14/2023   HCT 36.3 11/14/2023   MCV 86.0 11/14/2023   PLT 172 11/14/2023   Most recent BMP    Latest Ref Rng & Units 11/16/2023    3:56 PM  BMP  Glucose 70 - 99 mg/dL 324   BUN 6 - 20 mg/dL 25   Creatinine 4.01 - 1.00 mg/dL 0.27   Sodium 253 - 664 mmol/L 137   Potassium 3.5 - 5.1 mmol/L 4.2   Chloride 98 - 111 mmol/L 96   CO2 22 - 32 mmol/L 27   Calcium  8.9 - 10.3 mg/dL 8.2     Dema Filler, MD 11/22/2023, 12:37 PM  PGY-2, [place] [facility] FPTS Intern pager: [phone], text pages welcome Secure chat group [facility] Teaching Service

## 2023-11-22 NOTE — Assessment & Plan Note (Deleted)
 Patient now comfort care plan as below. - Avoid nephrotoxic agents

## 2023-11-22 NOTE — Assessment & Plan Note (Deleted)
 This patient has HF with severe MR which is terminal and unable to tolerate GDMT.  Continue with comfort measures per palliative care team.  Continue with diuresis as below for symptomatic relief. - Lasix  60 mg BID

## 2023-11-22 NOTE — Assessment & Plan Note (Signed)
 Patient to likely be discharged for home health hospice services today.  Continuing pain regimen and comfort regimen as below. - Pain reg: Tylenol  650 mg QID, Fentanyl  patch 100 mcg/hr every 72 hours, Dilaudid  4 mg q3h prn for breakthrough pain - Bowel reg: Miralax  17g BID and Senna BID prn at bedtime - Ativan  1 mg PO/IV mg q4h prn for anxiety/seizures/sleep - Compazine  5 mg IV q6h prn for nausea - Lidocaine  5% patch ordered - Palliative on board, appreciate recommendations

## 2023-11-22 NOTE — Assessment & Plan Note (Signed)
  ***   Improved symptoms with Lasix  60 mg twice daily for comfort.  No lower extremity edema or respiratory compromise today. - Continue Lasix  60 mg twice daily

## 2023-11-22 NOTE — TOC Progression Note (Addendum)
 Transition of Care [facility]) - Progression Note    Patient Details  Name: [name] [name] MRN: [id number] Date of Birth: 06-Jun-1971  Transition of Care [facility]) CM/SW Contact  Jannine Meo, RN Phone Number: 11/22/2023, 3:24 PM  Clinical Narrative:   Spoke with patient at bedside. She stays with her Vida Gravely [phone]. Lorie confirmed that someone is always home. Patient was able to recall the code to get into the house and Lorie confirmed it was correct. Code is 9313. Discharge home is on hold as pharmacy works through obtaining prior authorization for pain medications and inhaler. Patient does have a Breo inhaler at bedside that she could take home at discharge.    Expected Discharge Plan: Home w Hospice Care Barriers to Discharge: Continued Medical Work up  Expected Discharge Plan and Services In-house Referral: NA Discharge Planning Services: CM Consult Post Acute Care Choice: Hospice Living arrangements for the past 2 months: Single Family Home                           HH Arranged: RN [facility] Agency: [facility] Date HH Agency Contacted: 11/17/23 Time HH Agency Contacted: 1122 Representative spoke with at [facility] Agency: Cheri   Social Determinants of Health (SDOH) Interventions SDOH Screenings   Food Insecurity: Food Insecurity Present (11/16/2023)  Housing: High Risk (11/16/2023)  Transportation Needs: No Transportation Needs (11/15/2023)  Utilities: Not At Risk (11/15/2023)  Alcohol Screen: Low Risk  (02/12/2023)  Depression (PHQ2-9): High Risk (09/30/2022)  Financial Resource Strain: High Risk (09/30/2022)  Social Connections: Moderately Isolated (11/15/2023)  Stress: No Stress Concern Present (01/07/2022)   Received from [facility]  Tobacco Use: High Risk (11/14/2023)    Readmission Risk Interventions    07/23/2023   12:02 PM 07/10/2023    1:21 PM 02/12/2023    5:00 PM  Readmission Risk Prevention Plan  Transportation Screening Complete Complete Complete   Medication Review Oceanographer) Complete Complete   PCP or Specialist appointment within 3-5 days of discharge Complete Complete   HRI or Home Care Consult Complete Complete   SW Recovery Care/Counseling Consult Complete Patient refused   Palliative Care Screening Not Applicable Not Applicable   Skilled Nursing Facility Not Applicable Not Applicable

## 2023-11-22 NOTE — Assessment & Plan Note (Deleted)
 COPD/Asthma: Continuing home Dulera  with formulary alternative (Breo Ellipta ) and albuterol  2.5 mg as needed.   Anemia of chronic disease: Hgb 11.6. Smoking Hx: Smoked 1/4 ppd, and has been smoking since 53 yo. Started Nicorette  gum since she has had a reaction to the patch before HLD: Discontinued Crestor  20 mg daily  Bipolar disorder/anxiety/depression: Patient not taking Abilify , Atarax , or sertraline  as listed in her home medications.  T2DM: No further interventions as comfort care

## 2023-11-23 DIAGNOSIS — I5023 Acute on chronic systolic (congestive) heart failure: Secondary | ICD-10-CM | POA: Diagnosis not present

## 2023-11-23 DIAGNOSIS — Z7189 Other specified counseling: Secondary | ICD-10-CM | POA: Diagnosis not present

## 2023-11-23 DIAGNOSIS — Z515 Encounter for palliative care: Secondary | ICD-10-CM | POA: Diagnosis not present

## 2023-11-23 DIAGNOSIS — I509 Heart failure, unspecified: Secondary | ICD-10-CM | POA: Diagnosis not present

## 2023-11-23 NOTE — Progress Notes (Signed)
 Daily Progress Note   Patient Name: [name] [name]       Date: 11/23/2023 DOB: June 24, 1970  Age: 53 y.o. MRN#: [id number] Attending Physician: Candee Cha, MD Primary Care Physician: [facility] [place] [facility] Admit Date: 11/14/2023  Reason for Consultation/Follow-up: Establishing goals of care  Length of Stay: 9  Current Medications: Scheduled Meds:   acetaminophen   650 mg Oral Q6H   fentaNYL   1 patch Transdermal Q72H   fluticasone  furoate-vilanterol  1 puff Inhalation Daily   furosemide   60 mg Oral BID   lidocaine   1 patch Transdermal Q24H   pantoprazole   40 mg Oral Daily   polyethylene glycol  17 g Oral BID   senna  1 tablet Oral BID   sertraline   25 mg Oral Daily    Continuous Infusions:   PRN Meds: acetaminophen  **OR** acetaminophen , albuterol , alum & mag hydroxide-simeth, hydrocortisone  cream, HYDROmorphone , LORazepam  **OR** [DISCONTINUED] LORazepam  **OR** [DISCONTINUED] LORazepam , nicotine  polacrilex, ondansetron , polyethylene glycol, prochlorperazine   Physical Exam Vitals reviewed.  Constitutional:      General: She is awake. She is not in acute distress. HENT:     Head: Normocephalic and atraumatic.  Pulmonary:     Effort: Pulmonary effort is normal.   Skin:    General: Skin is warm and dry.   Psychiatric:        Behavior: Behavior normal.             Vital Signs: BP 122/81 (BP Location: Right Arm)   Pulse 99   Temp 98.2 F (36.8 C)   Resp 17   Ht 5' 4 (1.626 m)   Wt 64.9 kg   LMP  (LMP Unknown) Comment: Pt is poor historian  SpO2 97%   BMI 24.56 kg/m  SpO2: SpO2: 97 % O2 Device: O2 Device: Room Air    Patient Active Problem List   Diagnosis Date Noted   Chronic health problem 11/14/2023   Goals of care, counseling/discussion 07/23/2023    Acute exacerbation of congestive heart failure (HCC) 07/21/2023   GAD (generalized anxiety disorder) 07/18/2023   Acute on chronic combined systolic (congestive) and diastolic (congestive) heart failure (HCC) 07/18/2023   Bipolar 1 disorder (HCC) 07/06/2023   Chronic pain syndrome 05/25/2023   Heart failure (HCC) 05/23/2023   CHF (congestive heart failure) (HCC) 05/22/2023   Hypomagnesemia 05/22/2023  Severe mitral regurgitation 09/17/2022   CKD stage 3a, GFR 45-59 ml/min (HCC) 09/17/2022   Type 2 diabetes mellitus with hyperglycemia (HCC) 08/30/2022   Hyperthyroidism 08/29/2022   AKI (acute kidney injury) (HCC) 08/21/2022   COPD (chronic obstructive pulmonary disease) (HCC) 08/04/2022   Chronic systolic CHF (congestive heart failure) (HCC) 08/03/2022   Acute on chronic systolic CHF (congestive heart failure) (HCC) 05/15/2022   Overdose, intentional self-harm, initial encounter (HCC) 12/13/2018   DM2 (diabetes mellitus, type 2) (HCC) 01/03/2018   HTN (hypertension) 08/28/2015    Palliative Care Assessment & Plan   Patient Profile: 53 y.o. female  with past medical history of underlying bipolar disorder, anxiety disorder, ongoing tobacco use and underlying COPD, hypertension, HFrEF secondary to severe mitral regurgitation that is not amenable to repair procedure, hypothyroidism, CKD stage IIIb admitted on 11/14/2023 with chest pain and shortness of breath.   She was discharged from home hospice 11/09/23.    While in the ED the patient shared she would like to treat the treatable and be admitted and then return home with hospice after she was feeling better.  Today's Discussion: Chart reviewed. Patient received 20 mg of oral dilaudid  over the last 24 hours. She received as needed ativan  three times. Patient up and walking around her room. We discussed that the patient's fentanyl  patch will be changed today but remain at the same dose. We discussed the plan for patient to discharge today  if medications are prepared. Patient has no questions or concerns at this time.  Encouraged patient to call PMT with questions or concerns. PMT will continue to follow.   Recommendations/Plan: DNR Continue comfort-focused care Accepted by [facility] for home hospice services Please note - Outpatient palliative does not provide pain management services or prescribe opioid refills PMT will follow-up tomorrow  Symptom Management: Fentanyl  patch 100 mcg/hr every 72 hours (started 11/20/23) Continue oral dilaudid  4 mg every 3 hours as needed for breakthrough pain Ativan  1 mg po every 4 hours prn for anxiety/seizure/sleep Compazine  5 mg IV every 6 hours prn nausea Bowel regimen: Miralax  17 gm daily and prn for constipation Senna 1 tablet at bedtime prn constipation   Code Status:    Code Status Orders  (From admission, onward)           Start     Ordered   11/16/23 1345  Do not attempt resuscitation (DNR) - Comfort care  Continuous       Question Answer Comment  If patient has no pulse and is not breathing Do Not Attempt Resuscitation   In Pre-Arrest Conditions (Patient Is Breathing and Has a Pulse) Provide comfort measures. Relieve any mechanical airway obstruction. Avoid transfer unless required for comfort.   Consent: Discussion documented in EHR or advanced directives reviewed      11/16/23 1348           Extensive chart review has been completed prior to seeing the patient including labs, vital signs, imaging, progress/consult notes, orders, medications, and available advance directive documents.  Care plan was discussed with primary team  Time spent: 35 minutes  Thank you for allowing the Palliative Medicine Team to assist in the care of this patient.    Daina Drum, NP  Please contact Palliative Medicine Team phone at [phone] for questions and concerns.

## 2023-11-23 NOTE — Plan of Care (Signed)
  Problem: Fluid Volume: Goal: Ability to maintain a balanced intake and output will improve Outcome: Progressing   Problem: Nutritional: Goal: Maintenance of adequate nutrition will improve Outcome: Progressing   Problem: Skin Integrity: Goal: Risk for impaired skin integrity will decrease Outcome: Progressing
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
## 2023-11-23 NOTE — Plan of Care (Signed)
  Problem: Coping: Goal: Ability to adjust to condition or change in health will improve 11/23/2023 1853 by Lenda Quan, RN Outcome: Progressing 11/23/2023 1852 by Lenda Quan, RN Outcome: Progressing   Problem: Pain Managment: Goal: General experience of comfort will improve and/or be controlled Outcome: Progressing

## 2023-11-24 ENCOUNTER — Other Ambulatory Visit (HOSPITAL_COMMUNITY): Payer: Self-pay

## 2023-11-24 ENCOUNTER — Telehealth (HOSPITAL_COMMUNITY): Payer: Self-pay | Admitting: Pharmacy Technician

## 2023-11-24 DIAGNOSIS — I5023 Acute on chronic systolic (congestive) heart failure: Secondary | ICD-10-CM | POA: Diagnosis not present

## 2023-11-24 DIAGNOSIS — Z515 Encounter for palliative care: Secondary | ICD-10-CM | POA: Diagnosis not present

## 2023-11-24 MED ORDER — HYDROMORPHONE HCL 2 MG PO TABS
4.0000 mg | ORAL_TABLET | ORAL | Status: DC | PRN
  Administered 2023-11-24 – 2023-11-26 (×8): 4 mg via ORAL
  Filled 2023-11-24 (×8): qty 2

## 2023-11-24 MED ORDER — HYDROMORPHONE HCL 2 MG PO TABS
2.0000 mg | ORAL_TABLET | ORAL | Status: DC | PRN
  Administered 2023-11-24: 2 mg via ORAL
  Filled 2023-11-24: qty 1

## 2023-11-24 NOTE — Telephone Encounter (Signed)
 Pharmacy Patient Advocate Encounter   Received notification from Inpatient Request that prior authorization for fentaNYL  100MCG/HR 72 hr patches is required/requested.   Insurance verification completed.   The patient is insured through [place] .   Per test claim: PA required; PA submitted to above mentioned insurance via CoverMyMeds Key/confirmation #/EOC [id number] Status is pending

## 2023-11-24 NOTE — Assessment & Plan Note (Signed)
 Stable urine output.  Medically stable for discharge. - Lasix  60mg  BID

## 2023-11-24 NOTE — Telephone Encounter (Signed)
 Pharmacy Patient Advocate Encounter   Received notification from Inpatient Request that prior authorization for HYDROmorphone  HCl 4MG  tablets is required/requested.   Insurance verification completed.   The patient is insured through [place] .   Per test claim: PA required; PA submitted to above mentioned insurance via CoverMyMeds Key/confirmation #/EOC [id number] Status is pending

## 2023-11-24 NOTE — Telephone Encounter (Signed)
 Pharmacy Patient Advocate Encounter  Received notification from [employer] that Prior Authorization for Breo Ellipta  200-25MCG/ACT aerosol powder  has been APPROVED from 11/24/2023 to 11/23/2024   PA #/Case ID/Reference #: [id number]

## 2023-11-24 NOTE — Progress Notes (Signed)
     Daily Progress Note Intern Pager: [phone]  Patient name: [name] [name] Medical record number: [id number] Date of birth: Oct 08, 1970 Age: 53 y.o. Gender: female  Primary Care Provider: [facility] [place] [facility] Consultants: Palliative Code Status: DNR comfort  Pt Overview and Major Events to Date:  /6 admitted 6/8 transition to comfort care  Assessment and Plan:  This is a 53 year old female patient for acute on chronic CHF exacerbation with severe mitral regurg now transitioned to comfort care for terminal heart failure.  She is medically stable for DC home with home hospice pending prior Auth of medications and safe dispo. Assessment & Plan Acute exacerbation of congestive heart failure (HCC) Stable urine output.  Medically stable for discharge. - Lasix  60mg  BID Goals of care, counseling/discussion - Pain reg: Tylenol  650 mg QID, Fentanyl  patch 100 mcg/hr every 72 hours, Dilaudid  4 mg q3h prn for breakthrough pain - Bowel reg: Miralax  17g BID and Senna BID prn at bedtime - Ativan  1 mg PO/IV mg q4h prn for anxiety/seizures/sleep - Compazine  5 mg IV q6h prn for nausea - Lidocaine  5% patch ordered - Palliative on board, appreciate recommendations  FEN/GI: Regular PPx: None Dispo:Home with hospice.   Subjective:  Resting comfortably, no acute overnight events.  Objective: Temp:  [97.9 F (36.6 C)-98.3 F (36.8 C)] 98.2 F (36.8 C) (06/16 0819) Pulse Rate:  [84-98] 98 (06/16 0819) Resp:  [16-18] 16 (06/16 0303) BP: (112-118)/(64-78) 118/78 (06/16 0819) SpO2:  [92 %-96 %] 93 % (06/16 0819) Physical Exam: General: Well-appearing no distress Cardiovascular: RRR Respiratory: CTAB, no increased work of breathing Abdomen: Flat soft nontender Extremities: Appropriate movement, 2+ pulses bilaterally  Laboratory: Most recent CBC Lab Results  Component Value Date   WBC 7.0 11/14/2023   HGB 11.6 (L) 11/14/2023   HCT 36.3 11/14/2023   MCV 86.0 11/14/2023   PLT 172  11/14/2023   Most recent BMP    Latest Ref Rng & Units 11/16/2023    3:56 PM  BMP  Glucose 70 - 99 mg/dL 147   BUN 6 - 20 mg/dL 25   Creatinine 8.29 - 1.00 mg/dL 5.62   Sodium 130 - 865 mmol/L 137   Potassium 3.5 - 5.1 mmol/L 4.2   Chloride 98 - 111 mmol/L 96   CO2 22 - 32 mmol/L 27   Calcium  8.9 - 10.3 mg/dL 8.2     Rayma Calandra, DO 11/24/2023, 11:19 AM  PGY-1, [facility] FPTS Intern pager: [phone], text pages welcome Secure chat group [facility] Teaching Service

## 2023-11-24 NOTE — Progress Notes (Signed)
   Palliative Medicine Inpatient Follow Up Note HPI: 53 y.o. female  with past medical history of underlying bipolar disorder, anxiety disorder, ongoing tobacco use and underlying COPD, hypertension, HFrEF secondary to severe mitral regurgitation that is not amenable to repair procedure, hypothyroidism, CKD stage IIIb admitted on 11/14/2023 with chest pain and shortness of breath.   She was discharged from home hospice 11/09/23.    While in the ED the patient shared she would like to treat the treatable and be admitted and then return home with hospice after she was feeling better and her pain was controlled.  Today's Discussion 11/24/2023  *Please note that this is a verbal dictation therefore any spelling or grammatical errors are due to the Dragon Medical One system interpretation.  Chart reviewed inclusive of vital signs, progress notes, laboratory results, and diagnostic images.   I met with Britnie at bedside this morning. She is resting comfortably in NAD. She endorses ongoing pain though this has improved generally speaking. She shares she feels more forgetful than usual in part due to poor sleep. We reviewed the importance of good sleep hygiene.  Ellise and I reviewed the plan for her to discharge home with [facility] hospice. We reviewed that she is presently stable for this though we are awaiting pharmacy.   Plan for ongoing support.  Questions and concerns addressed/Palliative Support Provided.   Objective Assessment: Vital Signs Vitals:   11/24/23 0303 11/24/23 0819  BP: 112/73 118/78  Pulse: 95 98  Resp: 16   Temp: 98.2 F (36.8 C) 98.2 F (36.8 C)  SpO2: 92% 93%    Intake/Output Summary (Last 24 hours) at 11/24/2023 1128 Last data filed at 11/23/2023 1602 Gross per 24 hour  Intake --  Output 1000 ml  Net -1000 ml   Last Weight  Most recent update: 11/16/2023  4:29 AM    Weight  64.9 kg (143 lb 1.3 oz)            Gen:  Middle aged Caucasian G chronically ill  appearing HEENT: moist mucous membranes CV: Regular rate and rhythm  PULM: On 4LPM [place], breathing is even and nonlabored ABD: soft/nontender  EXT: No edema  Neuro: Alert and oriented x3   SUMMARY OF RECOMMENDATIONS   DNR Continue comfort-focused care Accepted by [facility] for home hospice services Please note - Outpatient palliative does not provide pain management services or prescribe opioid refills Awaiting pharmacy and TOC for discharge   Symptom Management: Fentanyl  patch 100 mcg/hr every 72 hours (started 11/20/23) Continue oral dilaudid  4 mg every 3 hours as needed for breakthrough pain Ativan  1 mg po every 4 hours prn for anxiety/seizure/sleep Compazine  5 mg IV every 6 hours prn nausea Bowel regimen: Miralax  17 gm daily and prn for constipation Senna 1 tablet at bedtime prn constipation   Code Status: DNR - comfort ______________________________________________________________________________________ Camille Cedars [place] Palliative Medicine Team Team Cell Phone: [phone] Please utilize secure chat with additional questions, if there is no response within 30 minutes please call the above phone number  Time Spent: 50 Billing based on MDM: High  Palliative Medicine Team providers are available by phone from 7am to 7pm daily and can be reached through the team cell phone.  Should this patient require assistance outside of these hours, please call the patient's attending physician.

## 2023-11-24 NOTE — Assessment & Plan Note (Signed)
-   Pain reg: Tylenol  650 mg QID, Fentanyl  patch 100 mcg/hr every 72 hours, Dilaudid  4 mg q3h prn for breakthrough pain - Bowel reg: Miralax  17g BID and Senna BID prn at bedtime - Ativan  1 mg PO/IV mg q4h prn for anxiety/seizures/sleep - Compazine  5 mg IV q6h prn for nausea - Lidocaine  5% patch ordered - Palliative on board, appreciate recommendations

## 2023-11-24 NOTE — Telephone Encounter (Signed)
 Pharmacy Patient Advocate Encounter   Received notification from Inpatient Request that prior authorization for Breo Ellipta  200-25MCG/ACT aerosol powder is required/requested.   Insurance verification completed.   The patient is insured through [place] .   Per test claim: PA required; PA submitted to above mentioned insurance via CoverMyMeds Key/confirmation #/EOC [facility] Status is pending

## 2023-11-24 NOTE — Progress Notes (Addendum)
 [facility] 4U98 [facility] Liaison Note  We continue to follow for discharge disposition.  Met with patient at bedside who states she will be going home by taxi. Confirmed address with patient she will be discharging to.  Our referral intake department is reaching out to her to schedule a hospice admission visit as we have been unable to reach her son.  Confirmed that she does not desire any O2 to be delivered to the home.  ADDENDUM 1302: Patient is very lethargic and unable to focus. Though she is up and about room, she is looking for things she cannot find, cannot carry on continued conversation. We have tried to schedule an admission visit but she has given us  3 different addresses. We cannot provide care for her if we do not have a reliable caregiver to connect with and ensure our hospice visits.  Please call with any questions or concerns.  Thank you, Lestine Rathke, BSN, [facility]  [phone]

## 2023-11-25 ENCOUNTER — Other Ambulatory Visit (HOSPITAL_COMMUNITY): Payer: Self-pay

## 2023-11-25 DIAGNOSIS — Z515 Encounter for palliative care: Secondary | ICD-10-CM | POA: Diagnosis not present

## 2023-11-25 DIAGNOSIS — G8929 Other chronic pain: Secondary | ICD-10-CM | POA: Diagnosis not present

## 2023-11-25 DIAGNOSIS — Z7189 Other specified counseling: Secondary | ICD-10-CM | POA: Diagnosis not present

## 2023-11-25 DIAGNOSIS — I5023 Acute on chronic systolic (congestive) heart failure: Secondary | ICD-10-CM | POA: Diagnosis not present

## 2023-11-25 MED ORDER — ACETAMINOPHEN 500 MG PO TABS
500.0000 mg | ORAL_TABLET | ORAL | Status: DC | PRN
  Filled 2023-11-25: qty 1

## 2023-11-25 NOTE — Assessment & Plan Note (Signed)
-   Pain reg: Tylenol  650 mg QID, Fentanyl  patch 100 mcg/hr every 72 hours, Dilaudid  4 mg q3h prn for breakthrough pain - Bowel reg: Miralax  17g BID and Senna BID prn at bedtime - Ativan  1 mg PO/IV mg q4h prn for anxiety/seizures/sleep - Compazine  5 mg IV q6h prn for nausea - Lidocaine  5% patch ordered - Palliative on board, appreciate recommendations

## 2023-11-25 NOTE — Progress Notes (Addendum)
 Palliative Medicine Inpatient Follow Up Note HPI: 53 y.o. female  with past medical history of underlying bipolar disorder, anxiety disorder, ongoing tobacco use and underlying COPD, hypertension, HFrEF secondary to severe mitral regurgitation that is not amenable to repair procedure, hypothyroidism, CKD stage IIIb admitted on 11/14/2023 with chest pain and shortness of breath.   She was discharged from home hospice 11/09/23.    While in the ED the patient shared she would like to treat the treatable and be admitted and then return home with hospice after she was feeling better and her pain was controlled.  Today's Discussion 11/25/2023  *Please note that this is a verbal dictation therefore any spelling or grammatical errors are due to the Dragon Medical One system interpretation.  Chart reviewed inclusive of vital signs, progress notes, laboratory results, and diagnostic images.   I met with [place] at bedside this morning. She shares the plan to transition from the hospital to live with her friend Avanell Bob. She feels that her pain is under fair control. We discussed the risk associated with her present medications and how they can lead to over sedation and confusion. Reviewed that as needed medication doses have been decreased.   [facility] and TOC are supporting discharge coordination.  Pharmacy is helping to arranged discharge medications.   Plan for ongoing support. ________________________________________________ Addendum:  I met with Dnyla this afternoon in the presence of Lestine Rathke of [place] and her good friend, Avanell Bob.  Avanell Bob shares that she and Kenzey became friends when their sons were 19 years old as they had become buddies. Avanell Bob notes that Natacha has had a rough time due to her mental illness and at one point over a year ago was homeless therefore Avanell Bob took her in. She shares they have been friends now for > 15 years.   Reviewed the plan for discharge and medication(s) at the  time of discharge. Discussed the idea of giving the patient some tylenol  prior to dilaudid  to see if it helps her breakthrough pain.   Goals are to get patient home to a more therapeutic environment.   Questions and concerns addressed/Palliative Support Provided.   Add Time: 35  Objective Assessment: Vital Signs Vitals:   11/25/23 0434 11/25/23 0749  BP: 106/66 107/73  Pulse: (!) 102 100  Resp: 19   Temp: 98.6 F (37 C)   SpO2: 92% 92%   No intake or output data in the 24 hours ending 11/25/23 1122  Last Weight  Most recent update: 11/16/2023  4:29 AM    Weight  64.9 kg (143 lb 1.3 oz)            Gen:  Middle aged Caucasian G chronically ill appearing HEENT: moist mucous membranes CV: Regular rate and rhythm  PULM: On RA, breathing is even and nonlabored ABD: soft/nontender  EXT: No edema  Neuro: Alert and oriented x2-3 - needs prompting at times  SUMMARY OF RECOMMENDATIONS   DNR Continue comfort-focused care Accepted by [facility] for home hospice services Please note - Outpatient palliative does not provide pain management services or prescribe opioid refills Awaiting pharmacy and TOC for discharge   Symptom Management: Fentanyl  patch 100 mcg/hr every 72 hours (started 11/20/23) Continue oral dilaudid  2 mg every 4 hours as needed for breakthrough pain Ativan  1 mg po every 4 hours prn for anxiety/seizure/sleep Compazine  5 mg IV every 6 hours prn nausea Bowel regimen: Miralax  17 gm daily and prn for constipation Senna 1 tablet at bedtime prn constipation  Code Status: DNR - comfort ______________________________________________________________________________________ Camille Cedars [place] Palliative Medicine Team Team Cell Phone: [phone] Please utilize secure chat with additional questions, if there is no response within 30 minutes please call the above phone number  Time Spent: 31  Palliative Medicine Team providers are available by phone  from 7am to 7pm daily and can be reached through the team cell phone.  Should this patient require assistance outside of these hours, please call the patient's attending physician.

## 2023-11-25 NOTE — Telephone Encounter (Signed)
 Pharmacy Patient Advocate Encounter  Received notification from [facility] that Prior Authorization for HYDROmorphone  HCl 4MG  tablets  has been APPROVED from 11/24/2023 to 05/26/2024   PA #/Case ID/Reference #: [id number]

## 2023-11-25 NOTE — Progress Notes (Signed)
 [id number] [facility] Liaison Note  Met with patient and PCG Yehuda Helms at bedside with Camille Cedars, NP with PMT to discuss dc plan. Avanell Bob agrees for patient to return home with her and provide primary caregiver duties as well as medication management. Requesting hospice provide a locked medication box.  Plan at this time is for discharge Wednesday at noon, by private vehicle with Avanell Bob.   Please ensure DNR accompanies patient and medications are picked up at outpatient pharmacy.  We have an admission visit scheduled for 3pm.  Please call with any questions or concerns.  Thank you, Lestine Rathke, BSN, [facility]  [phone]

## 2023-11-25 NOTE — Telephone Encounter (Signed)
 Pharmacy Patient Advocate Encounter  Received notification from [facility] that Prior Authorization for fentaNYL  100MCG/HR 72 hr patches has been APPROVED from 11/24/2023 to 02/25/2024   PA #/Case ID/Reference #: [id number]

## 2023-11-25 NOTE — Assessment & Plan Note (Addendum)
 Medically stable for discharge. - Lasix  60mg  BID -plans to stay with [facility] [phone]

## 2023-11-25 NOTE — TOC Progression Note (Signed)
 Transition of Care [facility]) - Progression Note    Patient Details  Name: [name] [name] MRN: [id number] Date of Birth: Mar 21, 1971  Transition [facility] Care [facility]) CM/SW Contact  Alisa App, RN Phone Number: 11/25/2023, 10:04 AM  Clinical Narrative:    Pt will d/c to home with friend Skipper Dumas ( friend) [phone], [address], [phone]. Lorie states pt will have 24/7 supervision , states someone is always in the home. Avanell Bob will be @ pt's bedside 1:30pm to discuss d/c for today with hospice liaison.  TOC team following and will assist with needs.  Son's address: [address] [place], [place], [place] [address], [address]  Expected Discharge Plan: Home w Home Health Services Barriers to Discharge: Other (must enter comment)  Expected Discharge Plan and Services In-house Referral: NA Discharge Planning Services: CM Consult Post Acute Care Choice: Hospice Living arrangements for the past 2 months: Single Family Home                           HH Arranged: RN [facility] Agency: [facility] Date HH Agency Contacted: 11/17/23 Time HH Agency Contacted: 1122 Representative spoke with at [facility] Agency: Cheri   Social Determinants of Health (SDOH) Interventions SDOH Screenings   Food Insecurity: Food Insecurity Present (11/16/2023)  Housing: High Risk (11/16/2023)  Transportation Needs: No Transportation Needs (11/15/2023)  Utilities: Not At Risk (11/15/2023)  Alcohol Screen: Low Risk  (02/12/2023)  Depression (PHQ2-9): High Risk (09/30/2022)  Financial Resource Strain: High Risk (09/30/2022)  Social Connections: Moderately Isolated (11/15/2023)  Stress: No Stress Concern Present (01/07/2022)   Received from [facility]  Tobacco Use: High Risk (11/14/2023)    Readmission Risk Interventions    07/23/2023   12:02 PM 07/10/2023    1:21 PM 02/12/2023    5:00 PM  Readmission Risk Prevention Plan  Transportation Screening Complete Complete Complete  Medication Review Oceanographer) Complete  Complete   PCP or Specialist appointment within 3-5 days of discharge Complete Complete   HRI or Home Care Consult Complete Complete   SW Recovery Care/Counseling Consult Complete Patient refused   Palliative Care Screening Not Applicable Not Applicable   Skilled Nursing Facility Not Applicable Not Applicable

## 2023-11-25 NOTE — Progress Notes (Signed)
     Daily Progress Note Intern Pager: [phone]  Patient name: [name] [name] Medical record number: [id number] Date of birth: December 06, 1970 Age: 53 y.o. Gender: female  Primary Care Provider: [facility]: Palliative Code Status: DNR-comfort  Pt Overview and Major Events to Date:  6/6: Admitted 6/8: Transition to comfort care  Assessment and Plan:  This is a 53 year old female patient initially admitted for diuresis due to acute on chronic CHF exacerbation now transition to comfort care for terminal heart failure.  She is medically stable for discharge home with home hospice. Assessment & Plan Acute exacerbation of congestive heart failure (HCC) Medically stable for discharge. - Lasix  60mg  BID -plans to stay with M[facility] Scale [phone] Goals of care, counseling/discussion - Pain reg: Tylenol  650 mg QID, Fentanyl  patch 100 mcg/hr every 72 hours, Dilaudid  4 mg q3h prn for breakthrough pain - Bowel reg: Miralax  17g BID and Senna BID prn at bedtime - Ativan  1 mg PO/IV mg q4h prn for anxiety/seizures/sleep - Compazine  5 mg IV q6h prn for nausea - Lidocaine  5% patch ordered - Palliative on board, appreciate recommendations  FEN/GI: Regular PPx: N/A, comfort care Dispo:Home today.    Subjective:  Patient reports her pain is well-controlled today.  We discussed who she would like hospice to call she provided the name and phone number of her friend Evalene Hilda.  Objective: Temp:  [98.2 F (36.8 C)-98.6 F (37 C)] 98.6 F (37 C) (06/17 0434) Pulse Rate:  [98-102] 102 (06/17 0434) Resp:  [18-19] 19 (06/17 0434) BP: (101-118)/(65-78) 106/66 (06/17 0434) SpO2:  [92 %-96 %] 92 % (06/17 0434) Physical Exam: General: Well-appearing, no distress Cardiovascular: RRR, no M/R/G Respiratory: CTAB, no increased work of breathing Extremities: 2+ peripheral pulses, no evidence of peripheral edema  Laboratory: Most recent CBC Lab Results  Component Value Date    WBC 7.0 11/14/2023   HGB 11.6 (L) 11/14/2023   HCT 36.3 11/14/2023   MCV 86.0 11/14/2023   PLT 172 11/14/2023   Most recent BMP    Latest Ref Rng & Units 11/16/2023    3:56 PM  BMP  Glucose 70 - 99 mg/dL 308   BUN 6 - 20 mg/dL 25   Creatinine 6.57 - 1.00 mg/dL 8.46   Sodium 962 - 952 mmol/L 137   Potassium 3.5 - 5.1 mmol/L 4.2   Chloride 98 - 111 mmol/L 96   CO2 22 - 32 mmol/L 27   Calcium  8.9 - 10.3 mg/dL 8.2     Rayma Calandra, DO 11/25/2023, 7:33 AM  PGY-1, [facility] FPTS Intern pager: [phone], text pages welcome Secure chat group [facility] Teaching Service

## 2023-11-26 ENCOUNTER — Other Ambulatory Visit (HOSPITAL_COMMUNITY): Payer: Self-pay

## 2023-11-26 DIAGNOSIS — Z7189 Other specified counseling: Secondary | ICD-10-CM | POA: Diagnosis not present

## 2023-11-26 DIAGNOSIS — Z515 Encounter for palliative care: Secondary | ICD-10-CM | POA: Diagnosis not present

## 2023-11-26 DIAGNOSIS — I5023 Acute on chronic systolic (congestive) heart failure: Secondary | ICD-10-CM | POA: Diagnosis not present

## 2023-11-26 NOTE — Plan of Care (Signed)
  Problem: Clinical Measurements: Goal: Quality of life will improve Outcome: Not Progressing   Problem: Role Relationship: Goal: Family's ability to cope with current situation will improve Outcome: Not Progressing Goal: Ability to verbalize concerns, feelings, and thoughts to partner or family member will improve Outcome: Not Progressing   Problem: Pain Management: Goal: Satisfaction with pain management regimen will improve Outcome: Not Progressing

## 2023-11-26 NOTE — TOC Transition Note (Addendum)
 Transition of Care [facility]) - Discharge Note   Patient Details  Name: [name] [name] MRN: [id number] Date of Birth: 20-Jul-1970  Transition of Care [facility]) CM/SW Contact:  Alisa App, RN Phone Number: 11/26/2023, 10:47 AM   Clinical Narrative:    Patient will DC to: home Anticipated DC date: 11/26/2023 Family notified: yes Transport by: car  Per MD patient ready for DC today. RN, patient, patient's friend Avanell Bob, and [employer] notified of DC.  Per Sherrlyn Dolores hospice intake will be @ 3:00 pm today. Pt without RX med concerns. RX meds filled by [facility] pharmacy and will be provide to pt with d/c by nurse. Friend to provide transportation to home. Post hospital f/u noted on AVS. RNCM will sign off for now as intervention is no longer needed. Please consult us  again if new needs arise.    Final next level of care: Home w Hospice Care Barriers to Discharge: No Barriers Identified   Patient Goals and CMS Choice Patient states their goals for this hospitalization and ongoing recovery are:: For pain to decrease- Pain management is being tweaked for home.          Discharge Placement                       Discharge Plan and Services Additional resources added to the After Visit Summary for   In-house Referral: NA Discharge Planning Services: CM Consult Post Acute Care Choice: Hospice                    HH Arranged: RN [facility] Agency: [facility] Date HH Agency Contacted: 11/17/23 Time HH Agency Contacted: 1122 Representative spoke with at [facility] Agency: Cheri  Social Drivers of Health (SDOH) Interventions SDOH Screenings   Food Insecurity: Food Insecurity Present (11/16/2023)  Housing: High Risk (11/16/2023)  Transportation Needs: No Transportation Needs (11/15/2023)  Utilities: Not At Risk (11/15/2023)  Alcohol Screen: Low Risk  (02/12/2023)  Depression (PHQ2-9): High Risk (09/30/2022)  Financial Resource Strain: High Risk (09/30/2022)  Social Connections:  Moderately Isolated (11/15/2023)  Stress: No Stress Concern Present (01/07/2022)   Received from [facility]  Tobacco Use: High Risk (11/14/2023)     Readmission Risk Interventions    07/23/2023   12:02 PM 07/10/2023    1:21 PM 02/12/2023    5:00 PM  Readmission Risk Prevention Plan  Transportation Screening Complete Complete Complete  Medication Review Oceanographer) Complete Complete   PCP or Specialist appointment within 3-5 days of discharge Complete Complete   HRI or Home Care Consult Complete Complete   SW Recovery Care/Counseling Consult Complete Patient refused   Palliative Care Screening Not Applicable Not Applicable   Skilled Nursing Facility Not Applicable Not Applicable

## 2023-11-26 NOTE — Progress Notes (Signed)
   Palliative Medicine Inpatient Follow Up Note HPI: 53 y.o. female  with past medical history of underlying bipolar disorder, anxiety disorder, ongoing tobacco use and underlying COPD, hypertension, HFrEF secondary to severe mitral regurgitation that is not amenable to repair procedure, hypothyroidism, CKD stage IIIb admitted on 11/14/2023 with chest pain and shortness of breath.   She was discharged from home hospice 11/09/23.    While in the ED the patient shared she would like to treat the treatable and be admitted and then return home with hospice after she was feeling better and her pain was controlled.  Today's Discussion 11/26/2023  *Please note that this is a verbal dictation therefore any spelling or grammatical errors are due to the Dragon Medical One system interpretation.  Chart reviewed inclusive of vital signs, progress notes, laboratory results, and diagnostic images.   I met with Paityn at bedside this morning. She is resting comfortably in NAD.  Reviewed plan with medication(s).  TOC and [facility] has coordinated the plan for discharge home today at 1200.  Questions and concerns addressed/Palliative Support Provided.    Objective Assessment: Vital Signs Vitals:   11/25/23 2238 11/26/23 0713  BP: 121/81 (!) 76/59  Pulse: (!) 103 99  Resp: 16   Temp: 98.1 F (36.7 C) 97.9 F (36.6 C)  SpO2: 94% 91%   No intake or output data in the 24 hours ending 11/26/23 1104  Last Weight  Most recent update: 11/16/2023  4:29 AM    Weight  64.9 kg (143 lb 1.3 oz)            Gen:  Middle aged Caucasian F chronically ill appearing HEENT: moist mucous membranes CV: Regular rate and rhythm  PULM: On RA, breathing is even and nonlabored ABD: soft/nontender  EXT: No edema  Neuro: Alert and oriented x2-3   SUMMARY OF RECOMMENDATIONS   DNR Continue comfort-focused care Accepted by [facility] for home hospice services [facility] pharmacy to provide medication(s) on  discharge Plan for discharge home this afternoon No additional inpatient PMT needs   Code Status: DNR - comfort ______________________________________________________________________________________ Camille Cedars  Palliative Medicine Team Team Cell Phone: [phone] Please utilize secure chat with additional questions, if there is no response within 30 minutes please call the above phone number  Time Spent: 43  Palliative Medicine Team providers are available by phone from 7am to 7pm daily and can be reached through the team cell phone.  Should this patient require assistance outside of these hours, please call the patient's attending physician.

## 2023-11-26 NOTE — Progress Notes (Addendum)
 455 pm [facility] Liaison Note  Per Hospice AV RN, pt is not able to comprehend what is being said to her, she is very disoriented in thought, she cannot hardly keep her eyes open and cannot communicate intelligibly. AV RN communicates that patient has medications from the hospital but also has Dilaudid , Ativan , Lidocaine  patches from previous prescriptions present in the home.  Patient was given Dilaudid  4 mg po and Ativan  1 mg po @ 0055, 0522 and 0954.  Patient is unable to sign her own consents. [facility] cannot admit to hospice services today. The soonest we could reevaluate patient is Friday June 20th.  Notified Carlee Charters, RNCM and Arnie Bibber, NP PMT of non-admit and patient condition.  Per Shelvy Dickens, caregiver should return patient to the hospital.  Please call with any questions or concerns.  Lestine Rathke, BSN, [employer]  [phone]

## 2023-11-26 NOTE — Progress Notes (Signed)
 Pt has been discharge to home. All medication from pharmacy presented to pt at bedside. Pt with no questions or concerns at the moment. Awaiting ride for transfer to home.

## 2023-12-10 ENCOUNTER — Encounter: Payer: Self-pay | Admitting: Physician Assistant

## 2023-12-10 NOTE — Progress Notes (Deleted)
 New patient visit   Patient: [name] [name]   DOB: 1970/07/16   53 y.o. Female  MRN: [id number] Visit Date: 12/11/2023  Today's healthcare provider: Manuelita Flatness, PA-C   No chief complaint on file.  Subjective    [name] [name] is a 53 y.o. female who presents today as a new patient to establish care.   Patient, who is currently in home hospice, has a history of terminal CHF, DM2, hypotension,   Meds were discontinued with comfort care.   Radiologist recommended repeat CT in 6 months d/t mild mediastinal and bilateral hilar adenopathy noted on CTPE, however since patient is comfort care this will have to be a further discussion.   Past Medical History:  Diagnosis Date   Anxiety disorder    Asthma    Bipolar affective disorder (HCC)    Blood transfusion without reported diagnosis    CHF (congestive heart failure) (HCC)    COPD (chronic obstructive pulmonary disease) (HCC)    Depression    Diabetes mellitus    Migraine    Past Surgical History:  Procedure Laterality Date   KNEE ARTHROSCOPY     x 2   KNEE SURGERY Left    Incision made and knee cleaned out   TUBAL LIGATION     Family Status  Relation Name Status   Mother  Deceased   Father  Deceased  No partnership data on file   Family History  Problem Relation Age of Onset   Bipolar disorder Mother    Hypertension Father    Diabetes Father    Social History   Socioeconomic History   Marital status: Divorced    Spouse name: Not on file   Number of children: Not on file   Years of education: Not on file   Highest education level: Not on file  Occupational History   Not on file  Tobacco Use   Smoking status: Every Day    Current packs/day: 1.00    Average packs/day: 1 pack/day for 29.0 years (29.0 ttl pk-yrs)    Types: Cigarettes   Smokeless tobacco: Never  Vaping Use   Vaping status: Former  Substance and Sexual Activity   Alcohol use: Not Currently    Comment: occasionally   Drug use: No    Sexual activity: Yes    Birth control/protection: Surgical  Other Topics Concern   Not on file  Social History Narrative   Patient lives in [place] with her 2 kids- 89 in 73 years old.   She has total 4 kids.   She is a single mom.         Social Drivers of Health   Financial Resource Strain: High Risk (09/30/2022)   Overall Financial Resource Strain (CARDIA)    Difficulty of Paying Living Expenses: Hard  Food Insecurity: Food Insecurity Present (11/16/2023)   Hunger Vital Sign    Worried About Running Out of Food in the Last Year: Sometimes true    Ran Out of Food in the Last Year: Sometimes true  Transportation Needs: No Transportation Needs (11/15/2023)   PRAPARE - Administrator, Civil Service (Medical): No    Lack of Transportation (Non-Medical): No  Physical Activity: Not on file  Stress: No Stress Concern Present (01/07/2022)   Received from [facility] - Occupational Stress Questionnaire    Feeling of Stress : Not at all  Social Connections: Moderately Isolated (11/15/2023)   Social Connection  and Isolation Panel    Frequency of Communication with Friends and Family: More than three times a week    Frequency of Social Gatherings with Friends and Family: More than three times a week    Attends Religious Services: More than 4 times per year    Active Member of [employer] or Organizations: No    Attends Banker Meetings: Never    Marital Status: Divorced   Outpatient Medications Prior to Visit  Medication Sig   acetaminophen  (TYLENOL ) 325 MG tablet Take 2 tablets (650 mg total) by mouth every 6 (six) hours.   albuterol  (VENTOLIN  HFA) 108 (90 Base) MCG/ACT inhaler Inhale 2 puffs into the lungs every 6 (six) hours as needed for wheezing or shortness of breath.   alum & mag hydroxide-simeth (MAALOX/MYLANTA) 200-200-20 MG/5ML suspension Take 15 mLs by mouth 2 (two) times daily as needed for indigestion or  heartburn.   fentaNYL  (DURAGESIC ) 100 MCG/HR Place 1 patch onto the skin every 3 (three) days.   fluticasone  furoate-vilanterol (BREO ELLIPTA ) 200-25 MCG/ACT AEPB Inhale 1 puff into the lungs daily.   furosemide  (LASIX ) 20 MG tablet Take 3 tablets (60 mg total) by mouth 2 (two) times daily.   furosemide  (LASIX ) 40 MG tablet Take 1 tablet (40 mg total) by mouth daily as needed for fluid or edema.   hydrocortisone  cream 0.5 % Apply topically 4 (four) times daily as needed for itching.   HYDROmorphone  (DILAUDID ) 4 MG tablet Take 1 tablet (4 mg total) by mouth every 3 (three) hours as needed (breakthrough pain).   ipratropium-albuterol  (DUONEB) 0.5-2.5 (3) MG/3ML SOLN inhale 3 mLs by nebulization every 6 (six) hours as needed. (Patient taking differently: Take 3 mLs by nebulization every 6 (six) hours as needed (for shortness of breath).)   lidocaine  (LIDODERM ) 5 % Place 1 patch onto the skin daily. Remove & Discard patch within 12 hours or as directed by MD   LORazepam  (ATIVAN ) 1 MG tablet Take 1 tablet (1 mg total) by mouth every 4 (four) hours as needed for anxiety, seizure or sleep.   ondansetron  (ZOFRAN -ODT) 4 MG disintegrating tablet Take 1 tablet (4 mg total) by mouth every 8 (eight) hours as needed for nausea or vomiting.   pantoprazole  (PROTONIX ) 40 MG tablet Take 1 tablet (40 mg total) by mouth daily.   polyethylene glycol powder (GLYCOLAX /MIRALAX ) 17 GM/SCOOP powder Take 17 g by mouth 2 (two) times daily.   polyethylene glycol (MIRALAX  / GLYCOLAX ) 17 g packet Take 17 g by mouth daily as needed for mild constipation, moderate constipation or severe constipation (if remain constipated).   senna (SENOKOT) 8.6 MG TABS tablet Take 1 tablet (8.6 mg total) by mouth 2 (two) times daily.   sertraline  (ZOLOFT ) 25 MG tablet Take 1 tablet (25 mg total) by mouth daily.   No facility-administered medications prior to visit.   Allergies  Allergen Reactions   Tramadol  Nausea Only and Rash   Wellbutrin  [Bupropion] Other (See Comments)    Behavioral changes (aggression, irritability)   Benadryl  [Diphenhydramine ] Other (See Comments)    Decreases motor skills and increases anxiety   Lactose Intolerance (Gi) Diarrhea   Nicotine  Rash    Patient states she is allergic to the Nicotine  patch and they cause her to break out in a rash.  She states she can smoke cigarettes.    Immunization History  Administered Date(s) Administered   Influenza Whole 04/29/2007   Td 03/10/2002    Health Maintenance  Topic Date Due  FOOT EXAM  Never done   OPHTHALMOLOGY EXAM  Never done   Diabetic kidney evaluation - Urine ACR  Never done   Hepatitis C Screening  Never done   Pneumococcal Vaccine 28-58 Years old (1 of 2 - PCV) Never done   Hepatitis B Vaccines (1 of 3 - 19+ 3-dose series) Never done   Cervical Cancer Screening (HPV/Pap Cotest)  Never done   DTaP/Tdap/Td (2 - Tdap) 03/10/2012   Colonoscopy  Never done   MAMMOGRAM  Never done   Zoster Vaccines- Shingrix (1 of 2) Never done   COVID-19 Vaccine (1 - 2024-25 season) Never done   HEMOGLOBIN A1C  08/20/2023   INFLUENZA VACCINE  01/09/2024   LIPID PANEL  02/14/2024   Lung Cancer Screening  11/13/2024   Diabetic kidney evaluation - eGFR measurement  11/15/2024   HIV Screening  Completed   HPV VACCINES  Aged Out   Meningococcal B Vaccine  Aged Out    Patient Care Team: [facility] as PCP - General Lonni Slain, MD as PCP - Cardiology (Cardiology)  Review of Systems  {Insert previous labs (optional):23779} {See past labs  Heme  Chem  Endocrine  Serology  Results Review (optional):1}   Objective    LMP  (LMP Unknown) Comment: Pt is poor historian {Insert last BP/Wt (optional):23777}{See vitals history (optional):1}   Physical Exam ***  Depression Screen    09/30/2022   11:54 AM 07/30/2020    6:34 AM 06/07/2020   12:28 PM 06/04/2020   12:57 AM  PHQ 2/9 Scores  PHQ - 2 Score 5 1  1   PHQ- 9 Score 19 8   5      Information is confidential and restricted. Go to Review Flowsheets to unlock data.   No results found for any visits on 12/11/23.  Assessment & Plan     There are no diagnoses linked to this encounter.   No follow-ups on file.      Manuelita Flatness, PA-C  [facility] at [facility] [phone] (phone) [phone] (fax)  [facility]

## 2023-12-11 ENCOUNTER — Encounter: Admitting: Physician Assistant

## 2023-12-13 ENCOUNTER — Encounter (HOSPITAL_COMMUNITY): Payer: Self-pay

## 2023-12-13 ENCOUNTER — Other Ambulatory Visit: Payer: Self-pay

## 2023-12-13 ENCOUNTER — Inpatient Hospital Stay (HOSPITAL_COMMUNITY)
Admission: EM | Admit: 2023-12-13 | Discharge: 2023-12-15 | DRG: 871 | Disposition: A | Discharge status: Hospice | Payer: MEDICAID | Attending: Critical Care Medicine | Admitting: Critical Care Medicine

## 2023-12-13 ENCOUNTER — Emergency Department (HOSPITAL_COMMUNITY): Payer: MEDICAID

## 2023-12-13 DIAGNOSIS — Z79899 Other long term (current) drug therapy: Secondary | ICD-10-CM

## 2023-12-13 DIAGNOSIS — Z888 Allergy status to other drugs, medicaments and biological substances status: Secondary | ICD-10-CM

## 2023-12-13 DIAGNOSIS — F259 Schizoaffective disorder, unspecified: Secondary | ICD-10-CM | POA: Diagnosis present

## 2023-12-13 DIAGNOSIS — Z91048 Other nonmedicinal substance allergy status: Secondary | ICD-10-CM

## 2023-12-13 DIAGNOSIS — I428 Other cardiomyopathies: Secondary | ICD-10-CM | POA: Diagnosis present

## 2023-12-13 DIAGNOSIS — I5082 Biventricular heart failure: Secondary | ICD-10-CM | POA: Diagnosis present

## 2023-12-13 DIAGNOSIS — R079 Chest pain, unspecified: Secondary | ICD-10-CM | POA: Diagnosis not present

## 2023-12-13 DIAGNOSIS — Z885 Allergy status to narcotic agent status: Secondary | ICD-10-CM

## 2023-12-13 DIAGNOSIS — E860 Dehydration: Secondary | ICD-10-CM | POA: Diagnosis present

## 2023-12-13 DIAGNOSIS — I4719 Other supraventricular tachycardia: Secondary | ICD-10-CM | POA: Diagnosis present

## 2023-12-13 DIAGNOSIS — Z515 Encounter for palliative care: Secondary | ICD-10-CM

## 2023-12-13 DIAGNOSIS — E1110 Type 2 diabetes mellitus with ketoacidosis without coma: Secondary | ICD-10-CM | POA: Diagnosis present

## 2023-12-13 DIAGNOSIS — I5023 Acute on chronic systolic (congestive) heart failure: Secondary | ICD-10-CM | POA: Diagnosis present

## 2023-12-13 DIAGNOSIS — Z8249 Family history of ischemic heart disease and other diseases of the circulatory system: Secondary | ICD-10-CM

## 2023-12-13 DIAGNOSIS — E876 Hypokalemia: Secondary | ICD-10-CM | POA: Diagnosis present

## 2023-12-13 DIAGNOSIS — I340 Nonrheumatic mitral (valve) insufficiency: Secondary | ICD-10-CM | POA: Diagnosis present

## 2023-12-13 DIAGNOSIS — R0600 Dyspnea, unspecified: Secondary | ICD-10-CM

## 2023-12-13 DIAGNOSIS — R739 Hyperglycemia, unspecified: Principal | ICD-10-CM

## 2023-12-13 DIAGNOSIS — Z9851 Tubal ligation status: Secondary | ICD-10-CM

## 2023-12-13 DIAGNOSIS — Z833 Family history of diabetes mellitus: Secondary | ICD-10-CM

## 2023-12-13 DIAGNOSIS — J9601 Acute respiratory failure with hypoxia: Secondary | ICD-10-CM | POA: Diagnosis present

## 2023-12-13 DIAGNOSIS — D631 Anemia in chronic kidney disease: Secondary | ICD-10-CM | POA: Diagnosis present

## 2023-12-13 DIAGNOSIS — J4489 Other specified chronic obstructive pulmonary disease: Secondary | ICD-10-CM | POA: Diagnosis present

## 2023-12-13 DIAGNOSIS — E1122 Type 2 diabetes mellitus with diabetic chronic kidney disease: Secondary | ICD-10-CM | POA: Diagnosis present

## 2023-12-13 DIAGNOSIS — I5084 End stage heart failure: Secondary | ICD-10-CM | POA: Diagnosis present

## 2023-12-13 DIAGNOSIS — R570 Cardiogenic shock: Secondary | ICD-10-CM | POA: Diagnosis present

## 2023-12-13 DIAGNOSIS — E739 Lactose intolerance, unspecified: Secondary | ICD-10-CM | POA: Diagnosis present

## 2023-12-13 DIAGNOSIS — F17210 Nicotine dependence, cigarettes, uncomplicated: Secondary | ICD-10-CM | POA: Diagnosis present

## 2023-12-13 DIAGNOSIS — Z66 Do not resuscitate: Secondary | ICD-10-CM | POA: Diagnosis present

## 2023-12-13 DIAGNOSIS — Z5986 Financial insecurity: Secondary | ICD-10-CM

## 2023-12-13 DIAGNOSIS — F319 Bipolar disorder, unspecified: Secondary | ICD-10-CM | POA: Diagnosis present

## 2023-12-13 DIAGNOSIS — F419 Anxiety disorder, unspecified: Secondary | ICD-10-CM | POA: Diagnosis present

## 2023-12-13 DIAGNOSIS — N1831 Chronic kidney disease, stage 3a: Secondary | ICD-10-CM | POA: Diagnosis present

## 2023-12-13 DIAGNOSIS — R571 Hypovolemic shock: Principal | ICD-10-CM | POA: Diagnosis present

## 2023-12-13 LAB — COMPREHENSIVE METABOLIC PANEL WITH GFR
ALT: 37 U/L (ref 0–44)
AST: 30 U/L (ref 15–41)
Albumin: 2.8 g/dL — ABNORMAL LOW (ref 3.5–5.0)
Alkaline Phosphatase: 97 U/L (ref 38–126)
Anion gap: 20 — ABNORMAL HIGH (ref 5–15)
BUN: 11 mg/dL (ref 6–20)
CO2: 21 mmol/L — ABNORMAL LOW (ref 22–32)
Calcium: 8.4 mg/dL — ABNORMAL LOW (ref 8.9–10.3)
Chloride: 95 mmol/L — ABNORMAL LOW (ref 98–111)
Creatinine, Ser: 1.22 mg/dL — ABNORMAL HIGH (ref 0.44–1.00)
GFR, Estimated: 53 mL/min — ABNORMAL LOW (ref >60)
Glucose, Bld: 705 mg/dL (ref 70–99)
Potassium: 3.1 mmol/L — ABNORMAL LOW (ref 3.5–5.1)
Sodium: 136 mmol/L (ref 135–145)
Total Bilirubin: 1.5 mg/dL — ABNORMAL HIGH (ref 0.0–1.2)
Total Protein: 5.9 g/dL — ABNORMAL LOW (ref 6.5–8.1)

## 2023-12-13 LAB — CBC WITH DIFFERENTIAL/PLATELET
Abs Immature Granulocytes: 0.08 K/uL — ABNORMAL HIGH (ref 0.00–0.07)
Basophils Absolute: 0 K/uL (ref 0.0–0.1)
Basophils Relative: 0 %
Eosinophils Absolute: 0 K/uL (ref 0.0–0.5)
Eosinophils Relative: 0 %
HCT: 43.1 % (ref 36.0–46.0)
Hemoglobin: 13 g/dL (ref 12.0–15.0)
Immature Granulocytes: 1 %
Lymphocytes Relative: 4 %
Lymphs Abs: 0.5 K/uL — ABNORMAL LOW (ref 0.7–4.0)
MCH: 26.2 pg (ref 26.0–34.0)
MCHC: 30.2 g/dL (ref 30.0–36.0)
MCV: 86.9 fL (ref 80.0–100.0)
Monocytes Absolute: 0.4 K/uL (ref 0.1–1.0)
Monocytes Relative: 4 %
Neutro Abs: 11.1 K/uL — ABNORMAL HIGH (ref 1.7–7.7)
Neutrophils Relative %: 91 %
Platelets: 232 K/uL (ref 150–400)
RBC: 4.96 MIL/uL (ref 3.87–5.11)
RDW: 18.2 % — ABNORMAL HIGH (ref 11.5–15.5)
WBC: 12.2 K/uL — ABNORMAL HIGH (ref 4.0–10.5)
nRBC: 0.2 % (ref 0.0–0.2)

## 2023-12-13 LAB — CBG MONITORING, ED
Glucose-Capillary: >600 mg/dL (ref 70–99)
Glucose-Capillary: >600 mg/dL (ref 70–99)
Glucose-Capillary: >600 mg/dL (ref 70–99)
Glucose-Capillary: 523 mg/dL (ref 70–99)

## 2023-12-13 LAB — I-STAT VENOUS BLOOD GAS, ED
Acid-Base Excess: 0 mmol/L (ref 0.0–2.0)
Bicarbonate: 25.8 mmol/L (ref 20.0–28.0)
Calcium, Ion: 1.01 mmol/L — ABNORMAL LOW (ref 1.15–1.40)
HCT: 45 % (ref 36.0–46.0)
Hemoglobin: 15.3 g/dL — ABNORMAL HIGH (ref 12.0–15.0)
O2 Saturation: 79 %
Potassium: 3.1 mmol/L — ABNORMAL LOW (ref 3.5–5.1)
Sodium: 135 mmol/L (ref 135–145)
TCO2: 27 mmol/L (ref 22–32)
pCO2, Ven: 45.4 mmHg (ref 44–60)
pH, Ven: 7.363 (ref 7.25–7.43)
pO2, Ven: 45 mmHg (ref 32–45)

## 2023-12-13 LAB — BETA-HYDROXYBUTYRIC ACID: Beta-Hydroxybutyric Acid: 1.3 mmol/L — ABNORMAL HIGH (ref 0.05–0.27)

## 2023-12-13 LAB — I-STAT CG4 LACTIC ACID, ED
Lactic Acid, Venous: 6.5 mmol/L (ref 0.5–1.9)
Lactic Acid, Venous: 3.4 mmol/L (ref 0.5–1.9)

## 2023-12-13 LAB — LIPASE, BLOOD: Lipase: 29 U/L (ref 11–51)

## 2023-12-13 LAB — PROTIME-INR
INR: 1.5 — ABNORMAL HIGH (ref 0.8–1.2)
Prothrombin Time: 19 s — ABNORMAL HIGH (ref 11.4–15.2)

## 2023-12-13 LAB — MAGNESIUM: Magnesium: 1.8 mg/dL (ref 1.7–2.4)

## 2023-12-13 LAB — TROPONIN I (HIGH SENSITIVITY)
Troponin I (High Sensitivity): 55 ng/L — ABNORMAL HIGH (ref <18)
Troponin I (High Sensitivity): 63 ng/L — ABNORMAL HIGH (ref <18)

## 2023-12-13 LAB — BRAIN NATRIURETIC PEPTIDE: B Natriuretic Peptide: >4500 pg/mL — ABNORMAL HIGH (ref 0.0–100.0)

## 2023-12-13 MED ORDER — HYDROMORPHONE HCL 1 MG/ML IJ SOLN
1.0000 mg | Freq: Once | INTRAMUSCULAR | Status: AC
  Administered 2023-12-13: 1 mg via INTRAVENOUS
  Filled 2023-12-13: qty 1

## 2023-12-13 MED ORDER — LACTATED RINGERS IV SOLN
INTRAVENOUS | Status: DC
Start: 2023-12-13 — End: 2023-12-14

## 2023-12-13 MED ORDER — DEXTROSE 50 % IV SOLN: 0.0000 mL | INTRAVENOUS | Status: DC | PRN

## 2023-12-13 MED ORDER — DEXTROSE IN LACTATED RINGERS 5 % IV SOLN: INTRAVENOUS | Status: DC

## 2023-12-13 MED ORDER — NOREPINEPHRINE 4 MG/250ML-% IV SOLN: 0.0000 ug/min | INTRAVENOUS | Status: DC

## 2023-12-13 MED ORDER — POTASSIUM CHLORIDE 10 MEQ/100ML IV SOLN
10.0000 meq | Freq: Once | INTRAVENOUS | Status: AC
  Administered 2023-12-13: 10 meq via INTRAVENOUS
  Filled 2023-12-13: qty 100

## 2023-12-13 MED ORDER — INSULIN REGULAR(HUMAN) IN NACL 100-0.9 UT/100ML-% IV SOLN
INTRAVENOUS | Status: DC
  Administered 2023-12-13: 11 [IU]/h via INTRAVENOUS
  Filled 2023-12-13: qty 100

## 2023-12-13 MED ORDER — FUROSEMIDE 10 MG/ML IJ SOLN
120.0000 mg | Freq: Once | INTRAVENOUS | Status: AC
  Administered 2023-12-13: 120 mg via INTRAVENOUS
  Filled 2023-12-13: qty 10

## 2023-12-13 MED ORDER — ONDANSETRON HCL 4 MG/2ML IJ SOLN
4.0000 mg | Freq: Once | INTRAMUSCULAR | Status: AC
  Administered 2023-12-13: 4 mg via INTRAVENOUS
  Filled 2023-12-13: qty 2

## 2023-12-13 NOTE — ED Notes (Signed)
RT at bedside for BiPap

## 2023-12-13 NOTE — ED Notes (Signed)
 Unable to start Insulin  drip per Endotool until BMP with creatinine is resulted. Provider aware.

## 2023-12-13 NOTE — ED Notes (Addendum)
 Cardiogy team at bedside, who report being familiar with pt

## 2023-12-13 NOTE — ED Notes (Signed)
 Cardiology at bedside.

## 2023-12-13 NOTE — ED Notes (Signed)
 Pt had removed bipap mask to ask for something to drink. Pt told she could drink, mask replaced by RT

## 2023-12-13 NOTE — Consult Note (Incomplete)
 Cardiology Consult   Patient ID: [name] MRN: [id number]; DOB: 12-11-1970   Admission date: 12/13/2023  Primary Care Provider: [facility] Primary Cardiologist: Shelda Bruckner, MD  Primary Electrophysiologist:  None   Chief Complaint:  Chest Pain   Patient Profile:   [name] [name] is a 53 y.o. female with Dilated NICM (LVID 7.2 cm) with an EF of <20%, severe MR, bipolar disease, schizoaffective disorder  History of Present Illness:   [name] presents to the ED via EMS for chest pain and shortness of breath.   [name] [name] was unable to talk upon arrival. History was obtained by EMS who spoke to her son upon pick up. Her son states that beginning today [name] [name] did not begin to look well and looked more somnolent and fatigued. She began to develop chest pain and EMS was called.   At time of arrival, [name] [name] was visibly distressed and using with notable accessory muscle use with a respiration of ~45 respirations per minute. Her blood pressure was 122/26. She was in an SVT with rates in the 170s. She was cool on exam with JVP of 15cm. POCUS with enlarged LV with EF <15%.I was not able to evaluate RV given difficult windows. IVC was dilated and non collapsible. Portable Xray was completed with clear lungs and known cardiomegaly. She was placed on BiPaP for work of breathing and within 10 minutes of bipap use, her HR decreased to sinus tachycardia at a rate of 120s, and her respirations became less labored.   [name] [name] has a Stage D NICM to which she presents to the ED several times each year for heart failure exacerbations. She has been evaluated at several institutions, including [facility], for advanced heart failure therapies and has not been eligible given her mental illness and how it impacts her ability to care for herself.   Past Medical History:  Diagnosis Date   Anxiety disorder    Asthma    Bipolar affective disorder (HCC)    Blood transfusion  without reported diagnosis    CHF (congestive heart failure) (HCC)    COPD (chronic obstructive pulmonary disease) (HCC)    Depression    Diabetes mellitus    Migraine     Past Surgical History:  Procedure Laterality Date   KNEE ARTHROSCOPY     x 2   KNEE SURGERY Left    Incision made and knee cleaned out   TUBAL LIGATION       Medications Prior to Admission: Prior to Admission medications   Medication Sig Start Date End Date Taking? Authorizing Provider  acetaminophen  (TYLENOL ) 325 MG tablet Take 2 tablets (650 mg total) by mouth every 6 (six) hours. 11/22/23   Tharon Lung, MD  albuterol  (VENTOLIN  HFA) 108 (90 Base) MCG/ACT inhaler Inhale 2 puffs into the lungs every 6 (six) hours as needed for wheezing or shortness of breath. 09/27/22   Arrien, Mauricio Daniel, MD  alum & mag hydroxide-simeth (MAALOX/MYLANTA) 200-200-20 MG/5ML suspension Take 15 mLs by mouth 2 (two) times daily as needed for indigestion or heartburn. 11/22/23   Tharon Lung, MD  fentaNYL  (DURAGESIC ) 100 MCG/HR Place 1 patch onto the skin every 3 (three) days. 11/24/23   Donah Laymon PARAS, MD  fluticasone  furoate-vilanterol (BREO ELLIPTA ) 200-25 MCG/ACT AEPB Inhale 1 puff into the lungs daily. 11/22/23   Tharon Lung, MD  furosemide  (LASIX ) 20 MG tablet Take 3 tablets (60 mg total) by mouth 2 (two) times daily. 11/22/23   Tharon Lung,  MD  furosemide  (LASIX ) 40 MG tablet Take 1 tablet (40 mg total) by mouth daily as needed for fluid or edema. 11/22/23 11/21/24  Tharon Lung, MD  hydrocortisone  cream 0.5 % Apply topically 4 (four) times daily as needed for itching. 11/22/23   Tharon Lung, MD  HYDROmorphone  (DILAUDID ) 4 MG tablet Take 1 tablet (4 mg total) by mouth every 3 (three) hours as needed (breakthrough pain). 11/22/23   Donah Laymon PARAS, MD  ipratropium-albuterol  (DUONEB) 0.5-2.5 (3) MG/3ML SOLN inhale 3 mLs by nebulization every 6 (six) hours as needed. Patient taking differently: Take 3 mLs by nebulization every  6 (six) hours as needed (for shortness of breath). 05/27/23   Arrien, Elidia Sieving, MD  lidocaine  (LIDODERM ) 5 % Place 1 patch onto the skin daily. Remove & Discard patch within 12 hours or as directed by MD 11/22/23   Tharon Lung, MD  LORazepam  (ATIVAN ) 1 MG tablet Take 1 tablet (1 mg total) by mouth every 4 (four) hours as needed for anxiety, seizure or sleep. 11/22/23   Tharon Lung, MD  ondansetron  (ZOFRAN -ODT) 4 MG disintegrating tablet Take 1 tablet (4 mg total) by mouth every 8 (eight) hours as needed for nausea or vomiting. 11/22/23   Tharon Lung, MD  pantoprazole  (PROTONIX ) 40 MG tablet Take 1 tablet (40 mg total) by mouth daily. 11/22/23   Tharon Lung, MD  polyethylene glycol powder (GLYCOLAX /MIRALAX ) 17 GM/SCOOP powder Take 17 g by mouth 2 (two) times daily. 11/22/23   Tharon Lung, MD  polyethylene glycol (MIRALAX  / GLYCOLAX ) 17 g packet Take 17 g by mouth daily as needed for mild constipation, moderate constipation or severe constipation (if remain constipated). 11/22/23   Tharon Lung, MD  senna (SENOKOT) 8.6 MG TABS tablet Take 1 tablet (8.6 mg total) by mouth 2 (two) times daily. 11/22/23   Tharon Lung, MD  sertraline  (ZOLOFT ) 25 MG tablet Take 1 tablet (25 mg total) by mouth daily. 11/22/23   Tharon Lung, MD  benztropine  (COGENTIN ) 1 MG tablet Take 1 tablet (1 mg total) by mouth 2 (two) times daily. 12/23/18 06/04/20  [name] Rogue, MD  insulin  aspart (NOVOLOG ) 100 UNIT/ML injection Inject 0-9 Units into the skin 3 (three) times daily with meals. Patient not taking: Reported on 06/03/2020 12/16/18 06/04/20  Christobal Guadalajara, MD     Allergies:    Allergies  Allergen Reactions   Tramadol  Nausea Only and Rash   Wellbutrin [Bupropion] Other (See Comments)    Behavioral changes (aggression, irritability)   Benadryl  [Diphenhydramine ] Other (See Comments)    Decreases motor skills and increases anxiety   Lactose Intolerance (Gi) Diarrhea   Nicotine  Rash    Patient states she is allergic to  the Nicotine  patch and they cause her to break out in a rash.  She states she can smoke cigarettes.    Social History:   Social History   Socioeconomic History   Marital status: Divorced    Spouse name: Not on file   Number of children: Not on file   Years of education: Not on file   Highest education level: Not on file  Occupational History   Not on file  Tobacco Use   Smoking status: Every Day    Current packs/day: 1.00    Average packs/day: 1 pack/day for 29.0 years (29.0 ttl pk-yrs)    Types: Cigarettes   Smokeless tobacco: Never  Vaping Use   Vaping status: Former  Substance and Sexual Activity   Alcohol  use: Not Currently  Comment: occasionally   Drug use: No   Sexual activity: Yes    Birth control/protection: Surgical  Other Topics Concern   Not on file  Social History Narrative   Patient lives in [place] with her 2 kids- 59 in 49 years old.   She has total 4 kids.   She is a single mom.         Social Drivers of Health   Financial Resource Strain: High Risk (09/30/2022)   Overall Financial Resource Strain (CARDIA)    Difficulty of Paying Living Expenses: Hard  Food Insecurity: Food Insecurity Present (11/16/2023)   Hunger Vital Sign    Worried About Running Out of Food in the Last Year: Sometimes true    Ran Out of Food in the Last Year: Sometimes true  Transportation Needs: No Transportation Needs (11/15/2023)   PRAPARE - Administrator, Civil Service (Medical): No    Lack of Transportation (Non-Medical): No  Physical Activity: Not on file  Stress: No Stress Concern Present (01/07/2022)   Received from [facility] - Occupational Stress Questionnaire    Feeling of Stress : Not at all  Social Connections: Moderately Isolated (11/15/2023)   Social Connection and Isolation Panel    Frequency of Communication with Friends and Family: More than three times a week    Frequency of Social Gatherings with  Friends and Family: More than three times a week    Attends Religious Services: More than 4 times per year    Active Member of [employer] or Organizations: No    Attends Banker Meetings: Never    Marital Status: Divorced  Catering manager Violence: Not At Risk (11/15/2023)   Humiliation, Afraid, Rape, and Kick questionnaire    Fear of Current or Ex-Partner: No    Emotionally Abused: No    Physically Abused: No    Sexually Abused: No    Family History:  *** The patient's family history includes Bipolar disorder in her mother; Diabetes in her father; Hypertension in her father.    Review of Systems: [y] = yes, [ ]  = no    General: Weight gain [ ] ; Weight loss [ ] ; Anorexia [ ] ; Fatigue [ ] ; Fever [ ] ; Chills [ ] ; Weakness [ ]   Cardiac: Chest pain/pressure [ ] ; Resting SOB [ ] ; Exertional SOB [ ] ; Orthopnea [ ] ; Pedal Edema [ ] ; Palpitations [ ] ; Syncope [ ] ; Presyncope [ ] ; Paroxysmal nocturnal dyspnea[ ]   Pulmonary: Cough [ ] ; Wheezing[ ] ; Hemoptysis[ ] ; Sputum [ ] ; Snoring [ ]   GI: Vomiting[ ] ; Dysphagia[ ] ; Melena[ ] ; Hematochezia [ ] ; Heartburn[ ] ; Abdominal pain [ ] ; Constipation [ ] ; Diarrhea [ ] ; BRBPR [ ]   GU: Hematuria[ ] ; Dysuria [ ] ; Nocturia[ ]   Vascular: Pain in legs with walking [ ] ; Pain in feet with lying flat [ ] ; Non-healing sores [ ] ; Stroke [ ] ; TIA [ ] ; Slurred speech [ ] ;  Neuro: Headaches[ ] ; Vertigo[ ] ; Seizures[ ] ; Paresthesias[ ] ;Blurred vision [ ] ; Diplopia [ ] ; Vision changes [ ]   Ortho/Skin: Arthritis [ ] ; Joint pain [ ] ; Muscle pain [ ] ; Joint swelling [ ] ; Back Pain [ ] ; Rash [ ]   Psych: Depression[ ] ; Anxiety[ ]   Heme: Bleeding problems [ ] ; Clotting disorders [ ] ; Anemia [ ]   Endocrine: Diabetes [ ] ; Thyroid  dysfunction[ ]   Physical Exam/Data:   Vitals:   12/13/23 1923 12/13/23 1931  BP: 122/85   Pulse: ROLLEN)  142 (!) 119  Resp: (!) 36 (!) 22  Temp: 98.6 F (37 C)   TempSrc: Temporal   SpO2: 100% 100%   No intake or output data in the 24  hours ending 12/13/23 1948 There were no vitals filed for this visit. There is no height or weight on file to calculate BMI.  General:  Acute distress with accessory muscle use  HEENT: normal Neck: JVP 16 cm Endocrine:  No thryomegaly Vascular: No carotid bruits; FA pulses 2+ bilaterally without bruits  Cardiac:  normal S1, S2; RRR; no murmur *** Lungs: clear to auscultation.  Abd: soft Ext: no edema Musculoskeletal:  No deformities Skin: intail cool extremities  Psych:  Flattened affect   EKG:  Initial EKG at 19:18 with sinus tachycardia with LVH and repolarization abnormalities. Wandering baseline present in II and III.   Relevant CV Studies: ECHO 07/2023 Personally reviewed  Severe LV dilatation LVIDd 7.2 cm. LV dysfunction with LVEF 20-25% with severe MR present.   Laboratory Data:  Chemistry Recent Labs  Lab 12/13/23 1928  NA 135  K 3.1*    No results for input(s): PROT, ALBUMIN , AST, ALT, ALKPHOS, BILITOT in the last 168 hours. Hematology Recent Labs  Lab 12/13/23 1922 12/13/23 1928  WBC 12.2*  --   RBC 4.96  --   HGB 13.0 15.3*  HCT 43.1 45.0  MCV 86.9  --   MCH 26.2  --   MCHC 30.2  --   RDW 18.2*  --   PLT 232  --    Cardiac EnzymesNo results for input(s): TROPONINI in the last 168 hours. No results for input(s): TROPIPOC in the last 168 hours.  BNPNo results for input(s): BNP, PROBNP in the last 168 hours.  DDimer No results for input(s): DDIMER in the last 168 hours.  Radiology/Studies:  DG Chest Portable 1 View Result Date: 12/13/2023 CLINICAL DATA:  Chest pain and dyspnea EXAM: PORTABLE CHEST 1 VIEW COMPARISON:  Chest radiograph and CT chest 11/14/2023 FINDINGS: Shallow inspiration. Cardiac enlargement. No vascular congestion or edema. Infiltrates seen previously are not demonstrated today, possibly due to portable technique. No pleural effusion or pneumothorax. Mediastinal contours appear intact. IMPRESSION: Cardiac enlargement.   Lungs are clear. Electronically Signed   By: Elsie Gravely M.D.   On: 12/13/2023 19:37    Assessment and Plan:   Ms. Demauro is a 53 year old female with end stage heart failure 2/2 NICM on hospice care who presented to the ED via EMS in respiratory distress. STEMI was activated, however her initial EKG was not consistent with STEMI. Her clinical picture is concerning for shock, likely cardiogenic. Her lungs are clear on CXR, but her IVC is >2.2 cm and did not collapse with inspiration. Her BNP is above assay. She would benefit form diuresis.   Her baseline systolics are in the 90s, however she presets with lactic acidosis that greatly improve with diuresis and bipap use alone (6.5> 3.4) . MAP currently of 60-64 with a lactate of 3.5. She does look improved. With regards to her goal of her care while in the hospital, she is not sure what she desires to do and if she desires to escalate care. Given her lactic acidosis with her hypotension, I would usalyl recommed the inittion of vasopressors, especially givne the convern for cardiogenic shock. However, she is undecided and clinically improving. Will continue to adddress with her.  She is clincially improving will   She has been deemed not a candidate for advanced  heart failure therapies. - Lasix  120 mg  - - Repeat EKG now with lower heart rate    For questions or updates, please contact [facility] Please consult [email or web address] for contact info under        Signed, Merlene JAYSON Blood, MD  12/13/2023 7:48 PM

## 2023-12-13 NOTE — ED Provider Notes (Signed)
 [place] EMERGENCY DEPARTMENT AT [facility] Provider Note   CSN: [id number] Arrival date & time: 12/13/23  8084     Patient presents with: Chest Pain   [name] [name] is a 53 y.o. female.   HPI Patient reportedly has been having chest pain for several days.  She reports central pain and radiation to the shoulder.  She has very dyspneic upon arrival of EMS.  Patient was placed on nonrebreather mask.  Also she has apparently been out of her insulin  for a week.  No reported fever or productive cough.    Prior to Admission medications   Medication Sig Start Date End Date Taking? Authorizing Provider  acetaminophen  (TYLENOL ) 325 MG tablet Take 2 tablets (650 mg total) by mouth every 6 (six) hours. 11/22/23   Tharon Lung, MD  albuterol  (VENTOLIN  HFA) 108 (90 Base) MCG/ACT inhaler Inhale 2 puffs into the lungs every 6 (six) hours as needed for wheezing or shortness of breath. 09/27/22   Arrien, Mauricio Daniel, MD  alum & mag hydroxide-simeth (MAALOX/MYLANTA) 200-200-20 MG/5ML suspension Take 15 mLs by mouth 2 (two) times daily as needed for indigestion or heartburn. 11/22/23   Tharon Lung, MD  fentaNYL  (DURAGESIC ) 100 MCG/HR Place 1 patch onto the skin every 3 (three) days. 11/24/23   Donah Laymon PARAS, MD  fluticasone  furoate-vilanterol (BREO ELLIPTA ) 200-25 MCG/ACT AEPB Inhale 1 puff into the lungs daily. 11/22/23   Tharon Lung, MD  furosemide  (LASIX ) 20 MG tablet Take 3 tablets (60 mg total) by mouth 2 (two) times daily. 11/22/23   Tharon Lung, MD  furosemide  (LASIX ) 40 MG tablet Take 1 tablet (40 mg total) by mouth daily as needed for fluid or edema. 11/22/23 11/21/24  Tharon Lung, MD  hydrocortisone  cream 0.5 % Apply topically 4 (four) times daily as needed for itching. 11/22/23   Tharon Lung, MD  HYDROmorphone  (DILAUDID ) 4 MG tablet Take 1 tablet (4 mg total) by mouth every 3 (three) hours as needed (breakthrough pain). 11/22/23   Donah Laymon PARAS, MD   ipratropium-albuterol  (DUONEB) 0.5-2.5 (3) MG/3ML SOLN inhale 3 mLs by nebulization every 6 (six) hours as needed. Patient taking differently: Take 3 mLs by nebulization every 6 (six) hours as needed (for shortness of breath). 05/27/23   Arrien, Elidia Sieving, MD  lidocaine  (LIDODERM ) 5 % Place 1 patch onto the skin daily. Remove & Discard patch within 12 hours or as directed by MD 11/22/23   Tharon Lung, MD  LORazepam  (ATIVAN ) 1 MG tablet Take 1 tablet (1 mg total) by mouth every 4 (four) hours as needed for anxiety, seizure or sleep. 11/22/23   Tharon Lung, MD  ondansetron  (ZOFRAN -ODT) 4 MG disintegrating tablet Take 1 tablet (4 mg total) by mouth every 8 (eight) hours as needed for nausea or vomiting. 11/22/23   Tharon Lung, MD  pantoprazole  (PROTONIX ) 40 MG tablet Take 1 tablet (40 mg total) by mouth daily. 11/22/23   Tharon Lung, MD  polyethylene glycol powder (GLYCOLAX /MIRALAX ) 17 GM/SCOOP powder Take 17 g by mouth 2 (two) times daily. 11/22/23   Tharon Lung, MD  polyethylene glycol (MIRALAX  / GLYCOLAX ) 17 g packet Take 17 g by mouth daily as needed for mild constipation, moderate constipation or severe constipation (if remain constipated). 11/22/23   Tharon Lung, MD  senna (SENOKOT) 8.6 MG TABS tablet Take 1 tablet (8.6 mg total) by mouth 2 (two) times daily. 11/22/23   Tharon Lung, MD  sertraline  (ZOLOFT ) 25 MG tablet Take 1 tablet (25 mg total)  by mouth daily. 11/22/23   Tharon Lung, MD  benztropine  (COGENTIN ) 1 MG tablet Take 1 tablet (1 mg total) by mouth 2 (two) times daily. 12/23/18 06/04/20  Malinda Rogue, MD  insulin  aspart (NOVOLOG ) 100 UNIT/ML injection Inject 0-9 Units into the skin 3 (three) times daily with meals. Patient not taking: Reported on 06/03/2020 12/16/18 06/04/20  Christobal Guadalajara, MD    Allergies: Tramadol , Wellbutrin [bupropion], Benadryl  [diphenhydramine ], Lactose intolerance (gi), and Nicotine     Review of Systems  Updated Vital Signs BP (!) 97/48   Pulse 90   Temp  98.4 F (36.9 C) (Oral)   Resp (!) 27   LMP  (LMP Unknown) Comment: Pt is poor historian  SpO2 100%   Physical Exam Constitutional:      Comments: Patient is very ill in appearance with increased work of breathing and diaphoresis.  She is answering questions with short few word responses.  HENT:     Mouth/Throat:     Pharynx: Oropharynx is clear.  Eyes:     Extraocular Movements: Extraocular movements intact.  Cardiovascular:     Rate and Rhythm: Regular rhythm. Tachycardia present.  Pulmonary:     Effort: Pulmonary effort is normal.     Comments: Patient has fine crackles at lung bases. Abdominal:     General: There is no distension.     Palpations: Abdomen is soft.     Tenderness: There is no abdominal tenderness.  Musculoskeletal:        General: No swelling or tenderness.     Right lower leg: No edema.     Left lower leg: No edema.  Skin:    Comments: Patient is pale and diaphoretic  Neurological:     General: No focal deficit present.     (all labs ordered are listed, but only abnormal results are displayed) Labs Reviewed  BETA-HYDROXYBUTYRIC ACID - Abnormal; Notable for the following components:      Result Value   Beta-Hydroxybutyric Acid 1.30 (*)    All other components within normal limits  COMPREHENSIVE METABOLIC PANEL WITH GFR - Abnormal; Notable for the following components:   Potassium 3.1 (*)    Chloride 95 (*)    CO2 21 (*)    Glucose, Bld 705 (*)    Creatinine, Ser 1.22 (*)    Calcium  8.4 (*)    Total Protein 5.9 (*)    Albumin  2.8 (*)    Total Bilirubin 1.5 (*)    GFR, Estimated 53 (*)    Anion gap 20 (*)    All other components within normal limits  CBC WITH DIFFERENTIAL/PLATELET - Abnormal; Notable for the following components:   WBC 12.2 (*)    RDW 18.2 (*)    Neutro Abs 11.1 (*)    Lymphs Abs 0.5 (*)    Abs Immature Granulocytes 0.08 (*)    All other components within normal limits  PROTIME-INR - Abnormal; Notable for the following  components:   Prothrombin Time 19.0 (*)    INR 1.5 (*)    All other components within normal limits  BRAIN NATRIURETIC PEPTIDE - Abnormal; Notable for the following components:   B Natriuretic Peptide >4,500.0 (*)    All other components within normal limits  D-DIMER, QUANTITATIVE - Abnormal; Notable for the following components:   D-Dimer, Quant 2.21 (*)    All other components within normal limits  I-STAT VENOUS BLOOD GAS, ED - Abnormal; Notable for the following components:   Potassium 3.1 (*)  Calcium , Ion 1.01 (*)    Hemoglobin 15.3 (*)    All other components within normal limits  I-STAT CG4 LACTIC ACID, ED - Abnormal; Notable for the following components:   Lactic Acid, Venous 6.5 (*)    All other components within normal limits  CBG MONITORING, ED - Abnormal; Notable for the following components:   Glucose-Capillary >600 (*)    All other components within normal limits  I-STAT CG4 LACTIC ACID, ED - Abnormal; Notable for the following components:   Lactic Acid, Venous 3.4 (*)    All other components within normal limits  CBG MONITORING, ED - Abnormal; Notable for the following components:   Glucose-Capillary >600 (*)    All other components within normal limits  CBG MONITORING, ED - Abnormal; Notable for the following components:   Glucose-Capillary >600 (*)    All other components within normal limits  CBG MONITORING, ED - Abnormal; Notable for the following components:   Glucose-Capillary 523 (*)    All other components within normal limits  CBG MONITORING, ED - Abnormal; Notable for the following components:   Glucose-Capillary 472 (*)    All other components within normal limits  TROPONIN I (HIGH SENSITIVITY) - Abnormal; Notable for the following components:   Troponin I (High Sensitivity) 55 (*)    All other components within normal limits  TROPONIN I (HIGH SENSITIVITY) - Abnormal; Notable for the following components:   Troponin I (High Sensitivity) 63 (*)     All other components within normal limits  CULTURE, BLOOD (ROUTINE X 2)  CULTURE, BLOOD (ROUTINE X 2)  LIPASE, BLOOD  MAGNESIUM   URINALYSIS, W/ REFLEX TO CULTURE (INFECTION SUSPECTED)  LACTIC ACID, PLASMA  LACTIC ACID, PLASMA    EKG: EKG Interpretation Date/Time:  Saturday December 13 2023 19:18:09 EDT Ventricular Rate:  153 PR Interval:  113 QRS Duration:  104 QT Interval:  348 QTC Calculation: 556 R Axis:   93  Text Interpretation: Sinus tachycardia LVH with secondary repolarization abnormality Non-specific ST-t changes Baseline wander Confirmed by Bernard Drivers ([id number]) on 12/14/2023 6:10:13 PM  Radiology: No results found.    Procedures  CRITICAL CARE Performed by: Ludivina Shines   Total critical care time: 60 minutes  Critical care time was exclusive of separately billable procedures and treating other patients.  Critical care was necessary to treat or prevent imminent or life-threatening deterioration.  Critical care was time spent personally by me on the following activities: development of treatment plan with patient and/or surrogate as well as nursing, discussions with consultants, evaluation of patient's response to treatment, examination of patient, obtaining history from patient or surrogate, ordering and performing treatments and interventions, ordering and review of laboratory studies, ordering and review of radiographic studies, pulse oximetry and re-evaluation of patient's condition.  Medications Ordered in the ED  insulin  regular, human (MYXREDLIN ) 100 units/ 100 mL infusion (10 Units/hr Intravenous Rate/Dose Change 12/13/23 2310)  lactated ringers  infusion ( Intravenous New Bag/Given 12/13/23 2015)  dextrose  5 % in lactated ringers  infusion (0 mLs Intravenous Hold 12/13/23 2030)  dextrose  50 % solution 0-50 mL (has no administration in time range)  norepinephrine  (LEVOPHED ) 4mg  in (0.016 mg/mL) premix infusion (0 mcg/min Intravenous Hold 12/13/23 2117)   furosemide  (LASIX ) 120 mg in dextrose  5 % 50 mL IVPB (0 mg Intravenous Stopped 12/13/23 2133)  HYDROmorphone  (DILAUDID ) injection 1 mg (1 mg Intravenous Given 12/13/23 1953)  ondansetron  (ZOFRAN ) injection 4 mg (4 mg Intravenous Given 12/13/23 1953)  potassium chloride  10 mEq in  100 mL IVPB (0 mEq Intravenous Stopped 12/13/23 2133)  HYDROmorphone  (DILAUDID ) injection 1 mg (1 mg Intravenous Given 12/13/23 2124)                                    Medical Decision Making Amount and/or Complexity of Data Reviewed Labs: ordered. Radiology: ordered.  Risk Prescription drug management. Decision regarding hospitalization.  Patient presents as outlined in significant respiratory distress.  Review of EMR shows patient has severe end-stage congestive heart failure and multiple medical comorbidities.  Patient was hypotensive, dyspneic on nonrebreather mask and was severe hyperglycemia.  Stabilization measures implemented with BiPAP and Lasix .  Consult Cardiology: Reviewed cardiology.  Patient was assessed at the bedside.  At this time appears most consistent with volume overload CHF.  Recommendation is for Lasix  and pressors if needed to support blood pressure.  Cardiology will continue to consult.  Review of lab work shows severe hyperglycemia but not hyperosmolar crisis or DKA.  IV insulin  initiated.  Multiple bedside patient rechecks done.  Patient is showing improvement on BiPAP and Lasix .  Although continues to have significant critical illness.  Consult: Reviewed with intensivist for admission.  Will assess in the emergency department.  I did speak to the patient's son listed as contact.  He reports there are a lot of family problems at this time and there is not any real consensus for seemingly general understanding of goals of care.     Final diagnoses:  Hyperglycemia  Dyspnea, unspecified type    ED Discharge Orders     None          Armenta Canning, MD 12/23/23 1358

## 2023-12-13 NOTE — ED Triage Notes (Signed)
 Pt BIOB [facility] for mid COP that radiates into jaw and shoulder with SOB. Pt has been out of insulin  x 1 week. CBG high upon EMS arrival.

## 2023-12-13 NOTE — Progress Notes (Signed)
   12/13/23 1938  Spiritual Encounters  Type of Visit Initial  Reason for visit Code  OnCall Visit Yes   Chaplain responded to Code STEMI and waited outside of ER Trauma A while Pt was worked on by the Programme researcher, broadcasting/film/video. Pt's family was not present. Then Code STEMI was canceled. No spiritual need at this time.  Chaplain Therisa Samuel

## 2023-12-14 ENCOUNTER — Inpatient Hospital Stay (HOSPITAL_COMMUNITY): Payer: MEDICAID

## 2023-12-14 DIAGNOSIS — R571 Hypovolemic shock: Secondary | ICD-10-CM

## 2023-12-14 DIAGNOSIS — E739 Lactose intolerance, unspecified: Secondary | ICD-10-CM | POA: Diagnosis present

## 2023-12-14 DIAGNOSIS — Z515 Encounter for palliative care: Secondary | ICD-10-CM | POA: Diagnosis not present

## 2023-12-14 DIAGNOSIS — R570 Cardiogenic shock: Secondary | ICD-10-CM | POA: Diagnosis present

## 2023-12-14 DIAGNOSIS — I340 Nonrheumatic mitral (valve) insufficiency: Secondary | ICD-10-CM | POA: Diagnosis present

## 2023-12-14 DIAGNOSIS — N1831 Chronic kidney disease, stage 3a: Secondary | ICD-10-CM | POA: Diagnosis present

## 2023-12-14 DIAGNOSIS — J9601 Acute respiratory failure with hypoxia: Secondary | ICD-10-CM | POA: Diagnosis present

## 2023-12-14 DIAGNOSIS — Z7189 Other specified counseling: Secondary | ICD-10-CM

## 2023-12-14 DIAGNOSIS — E1110 Type 2 diabetes mellitus with ketoacidosis without coma: Secondary | ICD-10-CM

## 2023-12-14 DIAGNOSIS — Z66 Do not resuscitate: Secondary | ICD-10-CM | POA: Diagnosis present

## 2023-12-14 DIAGNOSIS — I428 Other cardiomyopathies: Secondary | ICD-10-CM | POA: Diagnosis present

## 2023-12-14 DIAGNOSIS — F17210 Nicotine dependence, cigarettes, uncomplicated: Secondary | ICD-10-CM | POA: Diagnosis present

## 2023-12-14 DIAGNOSIS — R739 Hyperglycemia, unspecified: Secondary | ICD-10-CM | POA: Diagnosis not present

## 2023-12-14 DIAGNOSIS — I5021 Acute systolic (congestive) heart failure: Secondary | ICD-10-CM

## 2023-12-14 DIAGNOSIS — E876 Hypokalemia: Secondary | ICD-10-CM | POA: Diagnosis present

## 2023-12-14 DIAGNOSIS — E860 Dehydration: Secondary | ICD-10-CM | POA: Diagnosis present

## 2023-12-14 DIAGNOSIS — I5082 Biventricular heart failure: Secondary | ICD-10-CM | POA: Diagnosis present

## 2023-12-14 DIAGNOSIS — D631 Anemia in chronic kidney disease: Secondary | ICD-10-CM | POA: Diagnosis present

## 2023-12-14 DIAGNOSIS — I5023 Acute on chronic systolic (congestive) heart failure: Secondary | ICD-10-CM | POA: Diagnosis present

## 2023-12-14 DIAGNOSIS — I5084 End stage heart failure: Secondary | ICD-10-CM | POA: Diagnosis present

## 2023-12-14 DIAGNOSIS — F419 Anxiety disorder, unspecified: Secondary | ICD-10-CM | POA: Diagnosis present

## 2023-12-14 DIAGNOSIS — F259 Schizoaffective disorder, unspecified: Secondary | ICD-10-CM | POA: Diagnosis present

## 2023-12-14 DIAGNOSIS — J4489 Other specified chronic obstructive pulmonary disease: Secondary | ICD-10-CM | POA: Diagnosis present

## 2023-12-14 DIAGNOSIS — F319 Bipolar disorder, unspecified: Secondary | ICD-10-CM | POA: Diagnosis present

## 2023-12-14 DIAGNOSIS — E1122 Type 2 diabetes mellitus with diabetic chronic kidney disease: Secondary | ICD-10-CM | POA: Diagnosis present

## 2023-12-14 DIAGNOSIS — I4719 Other supraventricular tachycardia: Secondary | ICD-10-CM | POA: Diagnosis present

## 2023-12-14 LAB — BLOOD CULTURE ID PANEL (REFLEXED) - BCID2

## 2023-12-14 LAB — BASIC METABOLIC PANEL WITH GFR
Anion gap: 12 (ref 5–15)
Anion gap: 13 (ref 5–15)
Anion gap: 11 (ref 5–15)
Anion gap: 11 (ref 5–15)
Anion gap: 9 (ref 5–15)
Anion gap: 9 (ref 5–15)
BUN: 13 mg/dL (ref 6–20)
BUN: 13 mg/dL (ref 6–20)
BUN: 13 mg/dL (ref 6–20)
BUN: 18 mg/dL (ref 6–20)
BUN: 22 mg/dL — ABNORMAL HIGH (ref 6–20)
BUN: 27 mg/dL — ABNORMAL HIGH (ref 6–20)
CO2: 28 mmol/L (ref 22–32)
CO2: 25 mmol/L (ref 22–32)
CO2: 25 mmol/L (ref 22–32)
CO2: 27 mmol/L (ref 22–32)
CO2: 27 mmol/L (ref 22–32)
CO2: 29 mmol/L (ref 22–32)
Calcium: 7.8 mg/dL — ABNORMAL LOW (ref 8.9–10.3)
Calcium: 8.1 mg/dL — ABNORMAL LOW (ref 8.9–10.3)
Calcium: 7.8 mg/dL — ABNORMAL LOW (ref 8.9–10.3)
Calcium: 7.8 mg/dL — ABNORMAL LOW (ref 8.9–10.3)
Calcium: 8.1 mg/dL — ABNORMAL LOW (ref 8.9–10.3)
Calcium: 7.8 mg/dL — ABNORMAL LOW (ref 8.9–10.3)
Chloride: 97 mmol/L — ABNORMAL LOW (ref 98–111)
Chloride: 97 mmol/L — ABNORMAL LOW (ref 98–111)
Chloride: 98 mmol/L (ref 98–111)
Chloride: 95 mmol/L — ABNORMAL LOW (ref 98–111)
Chloride: 97 mmol/L — ABNORMAL LOW (ref 98–111)
Chloride: 98 mmol/L (ref 98–111)
Creatinine, Ser: 1.14 mg/dL — ABNORMAL HIGH (ref 0.44–1.00)
Creatinine, Ser: 0.95 mg/dL (ref 0.44–1.00)
Creatinine, Ser: 1.02 mg/dL — ABNORMAL HIGH (ref 0.44–1.00)
Creatinine, Ser: 1.18 mg/dL — ABNORMAL HIGH (ref 0.44–1.00)
Creatinine, Ser: 1.51 mg/dL — ABNORMAL HIGH (ref 0.44–1.00)
Creatinine, Ser: 1.3 mg/dL — ABNORMAL HIGH (ref 0.44–1.00)
GFR, Estimated: 57 mL/min — ABNORMAL LOW (ref >60)
GFR, Estimated: >60 mL/min (ref >60)
GFR, Estimated: >60 mL/min (ref >60)
GFR, Estimated: 55 mL/min — ABNORMAL LOW (ref >60)
GFR, Estimated: 41 mL/min — ABNORMAL LOW (ref >60)
GFR, Estimated: 49 mL/min — ABNORMAL LOW (ref >60)
Glucose, Bld: 339 mg/dL — ABNORMAL HIGH (ref 70–99)
Glucose, Bld: 101 mg/dL — ABNORMAL HIGH (ref 70–99)
Glucose, Bld: 133 mg/dL — ABNORMAL HIGH (ref 70–99)
Glucose, Bld: 332 mg/dL — ABNORMAL HIGH (ref 70–99)
Glucose, Bld: 336 mg/dL — ABNORMAL HIGH (ref 70–99)
Glucose, Bld: 277 mg/dL — ABNORMAL HIGH (ref 70–99)
Potassium: 2.5 mmol/L — CL (ref 3.5–5.1)
Potassium: 3.5 mmol/L (ref 3.5–5.1)
Potassium: 3.1 mmol/L — ABNORMAL LOW (ref 3.5–5.1)
Potassium: 3.2 mmol/L — ABNORMAL LOW (ref 3.5–5.1)
Potassium: 3.8 mmol/L (ref 3.5–5.1)
Potassium: 3.6 mmol/L (ref 3.5–5.1)
Sodium: 137 mmol/L (ref 135–145)
Sodium: 135 mmol/L (ref 135–145)
Sodium: 134 mmol/L — ABNORMAL LOW (ref 135–145)
Sodium: 133 mmol/L — ABNORMAL LOW (ref 135–145)
Sodium: 133 mmol/L — ABNORMAL LOW (ref 135–145)
Sodium: 136 mmol/L (ref 135–145)

## 2023-12-14 LAB — URINALYSIS, W/ REFLEX TO CULTURE (INFECTION SUSPECTED)
Bilirubin Urine: NEGATIVE
Glucose, UA: >=500 mg/dL — AB
Hgb urine dipstick: NEGATIVE
Ketones, ur: NEGATIVE mg/dL
Leukocytes,Ua: NEGATIVE
Nitrite: NEGATIVE
Protein, ur: NEGATIVE mg/dL
Specific Gravity, Urine: 1.02 (ref 1.005–1.030)
pH: 6 (ref 5.0–8.0)

## 2023-12-14 LAB — HEMOGLOBIN A1C
Hgb A1c MFr Bld: 11.6 % — ABNORMAL HIGH (ref 4.8–5.6)
Mean Plasma Glucose: 286.22 mg/dL

## 2023-12-14 LAB — CBC
HCT: 35.2 % — ABNORMAL LOW (ref 36.0–46.0)
HCT: 37.2 % (ref 36.0–46.0)
Hemoglobin: 10.5 g/dL — ABNORMAL LOW (ref 12.0–15.0)
Hemoglobin: 10.8 g/dL — ABNORMAL LOW (ref 12.0–15.0)
MCH: 25.5 pg — ABNORMAL LOW (ref 26.0–34.0)
MCH: 26.3 pg (ref 26.0–34.0)
MCHC: 29 g/dL — ABNORMAL LOW (ref 30.0–36.0)
MCHC: 29.8 g/dL — ABNORMAL LOW (ref 30.0–36.0)
MCV: 85.4 fL (ref 80.0–100.0)
MCV: 90.5 fL (ref 80.0–100.0)
Platelets: 168 K/uL (ref 150–400)
Platelets: 183 K/uL (ref 150–400)
RBC: 4.11 MIL/uL (ref 3.87–5.11)
RBC: 4.12 MIL/uL (ref 3.87–5.11)
RDW: 17.9 % — ABNORMAL HIGH (ref 11.5–15.5)
RDW: 18.3 % — ABNORMAL HIGH (ref 11.5–15.5)
WBC: 8.2 K/uL (ref 4.0–10.5)
WBC: 9.2 K/uL (ref 4.0–10.5)
nRBC: 0 % (ref 0.0–0.2)
nRBC: 0.3 % — ABNORMAL HIGH (ref 0.0–0.2)

## 2023-12-14 LAB — GLUCOSE, CAPILLARY
Glucose-Capillary: 183 mg/dL — ABNORMAL HIGH (ref 70–99)
Glucose-Capillary: 328 mg/dL — ABNORMAL HIGH (ref 70–99)
Glucose-Capillary: 331 mg/dL — ABNORMAL HIGH (ref 70–99)
Glucose-Capillary: 309 mg/dL — ABNORMAL HIGH (ref 70–99)

## 2023-12-14 LAB — LACTIC ACID, PLASMA
Lactic Acid, Venous: 4.1 mmol/L (ref 0.5–1.9)
Lactic Acid, Venous: 2.5 mmol/L (ref 0.5–1.9)
Lactic Acid, Venous: 1.7 mmol/L (ref 0.5–1.9)
Lactic Acid, Venous: 1.8 mmol/L (ref 0.5–1.9)

## 2023-12-14 LAB — CBG MONITORING, ED
Glucose-Capillary: 112 mg/dL — ABNORMAL HIGH (ref 70–99)
Glucose-Capillary: 156 mg/dL — ABNORMAL HIGH (ref 70–99)
Glucose-Capillary: 259 mg/dL — ABNORMAL HIGH (ref 70–99)
Glucose-Capillary: 363 mg/dL — ABNORMAL HIGH (ref 70–99)
Glucose-Capillary: 472 mg/dL — ABNORMAL HIGH (ref 70–99)

## 2023-12-14 LAB — BETA-HYDROXYBUTYRIC ACID
Beta-Hydroxybutyric Acid: 0.06 mmol/L (ref 0.05–0.27)
Beta-Hydroxybutyric Acid: 0.06 mmol/L (ref 0.05–0.27)
Beta-Hydroxybutyric Acid: 0.13 mmol/L (ref 0.05–0.27)
Beta-Hydroxybutyric Acid: 0.09 mmol/L (ref 0.05–0.27)
Beta-Hydroxybutyric Acid: 0.08 mmol/L (ref 0.05–0.27)
Beta-Hydroxybutyric Acid: 0.06 mmol/L (ref 0.05–0.27)

## 2023-12-14 LAB — MRSA NEXT GEN BY PCR, NASAL: MRSA by PCR Next Gen: NOT DETECTED

## 2023-12-14 LAB — PROTIME-INR
INR: 1.6 — ABNORMAL HIGH (ref 0.8–1.2)
Prothrombin Time: 19.9 s — ABNORMAL HIGH (ref 11.4–15.2)

## 2023-12-14 LAB — PHOSPHORUS: Phosphorus: 1.9 mg/dL — ABNORMAL LOW (ref 2.5–4.6)

## 2023-12-14 LAB — D-DIMER, QUANTITATIVE: D-Dimer, Quant: 2.21 ug{FEU}/mL — ABNORMAL HIGH (ref 0.00–0.50)

## 2023-12-14 LAB — MAGNESIUM: Magnesium: 1.6 mg/dL — ABNORMAL LOW (ref 1.7–2.4)

## 2023-12-14 MED ORDER — INSULIN GLARGINE-YFGN 100 UNIT/ML ~~LOC~~ SOLN
8.0000 [IU] | Freq: Two times a day (BID) | SUBCUTANEOUS | Status: DC
  Administered 2023-12-14: 8 [IU] via SUBCUTANEOUS
  Filled 2023-12-14 (×3): qty 0.08

## 2023-12-14 MED ORDER — HYDROMORPHONE HCL 1 MG/ML IJ SOLN
0.5000 mg | INTRAMUSCULAR | Status: DC | PRN
  Administered 2023-12-14 – 2023-12-15 (×8): 1 mg via INTRAVENOUS
  Filled 2023-12-14 (×8): qty 1

## 2023-12-14 MED ORDER — LORAZEPAM 1 MG PO TABS
0.5000 mg | ORAL_TABLET | Freq: Four times a day (QID) | ORAL | Status: DC | PRN
  Administered 2023-12-14: 0.5 mg via ORAL
  Filled 2023-12-14: qty 1

## 2023-12-14 MED ORDER — FUROSEMIDE 40 MG PO TABS
60.0000 mg | ORAL_TABLET | Freq: Two times a day (BID) | ORAL | Status: DC
  Administered 2023-12-14 – 2023-12-15 (×2): 60 mg via ORAL
  Filled 2023-12-14 (×2): qty 1

## 2023-12-14 MED ORDER — ARFORMOTEROL TARTRATE 15 MCG/2ML IN NEBU
15.0000 ug | INHALATION_SOLUTION | Freq: Two times a day (BID) | RESPIRATORY_TRACT | Status: DC
  Administered 2023-12-14 (×2): 15 ug via RESPIRATORY_TRACT
  Filled 2023-12-14 (×2): qty 2

## 2023-12-14 MED ORDER — CALCIUM GLUCONATE-NACL 1-0.675 GM/50ML-% IV SOLN
1.0000 g | Freq: Once | INTRAVENOUS | Status: AC
  Administered 2023-12-14: 1000 mg via INTRAVENOUS
  Filled 2023-12-14: qty 50

## 2023-12-14 MED ORDER — OXYCODONE HCL 5 MG PO TABS
5.0000 mg | ORAL_TABLET | Freq: Four times a day (QID) | ORAL | Status: DC | PRN
  Administered 2023-12-14 (×3): 5 mg via ORAL
  Filled 2023-12-14 (×3): qty 1

## 2023-12-14 MED ORDER — INSULIN GLARGINE-YFGN 100 UNIT/ML ~~LOC~~ SOLN
8.0000 [IU] | Freq: Two times a day (BID) | SUBCUTANEOUS | Status: DC
  Administered 2023-12-14: 8 [IU] via SUBCUTANEOUS
  Filled 2023-12-14 (×2): qty 0.08

## 2023-12-14 MED ORDER — POTASSIUM CHLORIDE 10 MEQ/100ML IV SOLN
10.0000 meq | INTRAVENOUS | Status: AC
  Administered 2023-12-14 (×2): 10 meq via INTRAVENOUS
  Filled 2023-12-14 (×2): qty 100

## 2023-12-14 MED ORDER — BUDESONIDE 0.25 MG/2ML IN SUSP
0.2500 mg | Freq: Two times a day (BID) | RESPIRATORY_TRACT | Status: DC
  Administered 2023-12-14 (×2): 0.25 mg via RESPIRATORY_TRACT
  Filled 2023-12-14 (×2): qty 2

## 2023-12-14 MED ORDER — SERTRALINE HCL 50 MG PO TABS
25.0000 mg | ORAL_TABLET | Freq: Every day | ORAL | Status: DC
  Administered 2023-12-14: 25 mg via ORAL
  Filled 2023-12-14: qty 1

## 2023-12-14 MED ORDER — POTASSIUM CHLORIDE CRYS ER 20 MEQ PO TBCR
40.0000 meq | EXTENDED_RELEASE_TABLET | ORAL | Status: AC
  Administered 2023-12-14 (×2): 40 meq via ORAL
  Filled 2023-12-14 (×2): qty 2

## 2023-12-14 MED ORDER — POTASSIUM CHLORIDE 20 MEQ PO PACK
40.0000 meq | PACK | Freq: Once | ORAL | Status: AC
  Administered 2023-12-14: 40 meq via ORAL
  Filled 2023-12-14: qty 2

## 2023-12-14 MED ORDER — INSULIN ASPART 100 UNIT/ML IJ SOLN
0.0000 [IU] | INTRAMUSCULAR | Status: DC
  Administered 2023-12-14 (×3): 11 [IU] via SUBCUTANEOUS
  Administered 2023-12-15: 8 [IU] via SUBCUTANEOUS
  Administered 2023-12-15: 5 [IU] via SUBCUTANEOUS

## 2023-12-14 MED ORDER — INSULIN GLARGINE-YFGN 100 UNIT/ML ~~LOC~~ SOLN
8.0000 [IU] | Freq: Two times a day (BID) | SUBCUTANEOUS | Status: DC
  Filled 2023-12-14: qty 0.08

## 2023-12-14 MED ORDER — CHLORHEXIDINE GLUCONATE CLOTH 2 % EX PADS
6.0000 | MEDICATED_PAD | Freq: Every day | CUTANEOUS | Status: DC
  Administered 2023-12-14: 6 via TOPICAL

## 2023-12-14 MED ORDER — DOCUSATE SODIUM 100 MG PO CAPS: 100.0000 mg | ORAL_CAPSULE | Freq: Two times a day (BID) | ORAL | Status: DC | PRN

## 2023-12-14 MED ORDER — SODIUM CHLORIDE 0.9 % IV SOLN
250.0000 mL | INTRAVENOUS | Status: DC
  Administered 2023-12-14: 250 mL via INTRAVENOUS

## 2023-12-14 MED ORDER — POTASSIUM CHLORIDE 10 MEQ/100ML IV SOLN: 10.0000 meq | INTRAVENOUS | Status: DC

## 2023-12-14 MED ORDER — MAGNESIUM SULFATE 2 GM/50ML IV SOLN
2.0000 g | Freq: Once | INTRAVENOUS | Status: AC
  Administered 2023-12-14: 2 g via INTRAVENOUS
  Filled 2023-12-14: qty 50

## 2023-12-14 MED ORDER — POTASSIUM & SODIUM PHOSPHATES 280-160-250 MG PO PACK
2.0000 | PACK | ORAL | Status: AC
  Administered 2023-12-14 – 2023-12-15 (×4): 2 via ORAL
  Filled 2023-12-14 (×4): qty 2

## 2023-12-14 MED ORDER — REVEFENACIN 175 MCG/3ML IN SOLN
175.0000 ug | Freq: Every day | RESPIRATORY_TRACT | Status: DC
  Administered 2023-12-14: 175 ug via RESPIRATORY_TRACT
  Filled 2023-12-14: qty 3

## 2023-12-14 MED ORDER — POLYETHYLENE GLYCOL 3350 17 G PO PACK: 17.0000 g | PACK | Freq: Every day | ORAL | Status: DC | PRN

## 2023-12-14 MED ORDER — NOREPINEPHRINE 4 MG/250ML-% IV SOLN
0.0000 ug/min | INTRAVENOUS | Status: DC
  Administered 2023-12-14: 2 ug/min via INTRAVENOUS
  Filled 2023-12-14: qty 250

## 2023-12-14 MED ORDER — INSULIN ASPART 100 UNIT/ML IJ SOLN
2.0000 [IU] | INTRAMUSCULAR | Status: DC
  Administered 2023-12-14: 4 [IU] via SUBCUTANEOUS

## 2023-12-14 NOTE — Progress Notes (Signed)
 Patient ID: [name] [name], female   DOB: 10-06-1970, 53 y.o.   MRN: [id number]  Discussed patient with Dr. Harold, plan to clarify status this morning with family, possible transition to comfort care.  For now, I will not see.  Dr. Harold will let me know if she is not going forward with comfort care.   Ezra Shuck 12/14/2023

## 2023-12-14 NOTE — Progress Notes (Signed)
 Patient placed on 8L salter, currently tolerating well. RN informed.

## 2023-12-14 NOTE — Progress Notes (Signed)
 eLink Physician-Brief Progress Note Patient Name: [name] [name] DOB: [birth year] MRN: [id number]   Date of Service  12/14/2023  HPI/Events of Note  Notified that patient is dynpneic at times, resp 22 sats 99% on HFNC, BSRN reports some fine crackels bilateral,  850 cc UO from last night  Patient states she's not getting her daily lasix  and feels like she's not making her usual UO. BSRN asking for PCXR and possibly resume home lasix   K+ 3.6 from 21:33, due 2 more doses of K+ Phos tonight. Bladder scan shows only 150cc  Seen resting, not in distress Fluid poistive > 2L  eICU Interventions  Ordered to resume Lasix  60 mg BID     Intervention Category Intermediate Interventions: Respiratory distress - evaluation and management  Damien ONEIDA Grout 12/14/2023, 10:55 PM

## 2023-12-14 NOTE — Progress Notes (Signed)
 Interim CCM Progress Note  Notified by beside ED RN, that patient unable to tolerate IV potassium even with decreased rate along with 20ml/hr carrier IV fluid. Patient did tolerate oral potassium 40 meq at 0517. Patient has received a total of 2 runs of IV potassium. Limited on increasing carrier fluid rate due to severe CHF.  Mag 1.6  P: Currently transitioning of insulin  gtt STAT BMET, to evaluate potassium level  Discussed with pharmacy, will order additional dose of 40 meq PO potassium in AM.  Continue Continuous Cardiac Telemetry  Replace Mag-with 2 grams now   Sherlean Sharps AGACNP-BC   [facility] 12/14/2023, 6:36 AM  Please see [email or web address] for pager details.  From 7A-7P if no response, please call [phone]. After hours, please call ELink [phone].

## 2023-12-14 NOTE — H&P (Signed)
 NAME:  [name] [name], MRN:  [id number], DOB:  1970-07-12, LOS: 0 ADMISSION DATE:  12/13/2023, CONSULTATION DATE: 12/13/23 REFERRING MD:  Ar[place] CHIEF COMPLAINT:  Chest Pain   History of Present Illness:  Patient is a 53 year old female with significant past medical history of HFrEF-EF less than 20%, NICM severe mitral valve regurgitation, COPD, diabetes depression, bipolar disorder type I, anxiety, and on/off hospice care who presents to [facility], ED on 7/5 with central chest pain radiating to right shoulder as well as dyspnea.  Upon ED arrival patient was placed on a nonrebreather mask and subsequently had to be placed on BiPAP due to worsening respiratory distress.  Originally code STEMI was called and cardiology was consulted.  Cardiology performed POCUS at bedside-with findings of LV enlargement/EF less than 15%.  EKG not indicative of STEMI but SVT noted with LVH.  Clinical picture consistent with acute CHF exacerbation with cardiogenic shock vs hypovolemic shock. Patient also found to not have been taking insulin  for the past week due to running out of medication.  Patient was started on Levophed  for hypotension, started on diuresis, and insulin  drip for hyperglycemia/DKA.  PCCM consulted for admission and further management.  Initial ED workup-sodium 136, potassium 3.1, chloride 95, CO2 21, glucose 705, creatinine 1.22, calcium  8.4, BNP greater than 4500, high-sensitivity troponin 63, lactic acid 6.5, WBC 9.2, hemoglobin 10.8  Upon assessment of patient, patient alert and oriented x 4, still having central chest pain.  Patient endorsed that chest pain had been ongoing for the last 2 to 3 days with progression.  Patient states that she has been in and out of hospice due to her heart failure.  Patient also asking for pain to be addressed. Discussed with patient goals of care in regards to patient's severe heart failure-EF 15 to 20%.  Patient would like to focus on quality of life versus quantity  and pursue hospice care. Discussed with patient that with her being on BiPAP as well as needing vasopressor support that hospital death could be likely.  Recommend focusing on quality of life versus quantity and eventually transition to comfort once she is ready.  Patient would like to wait on son to arrive before transitioning to comfort care.   Pertinent  Medical History   Past Medical History:  Diagnosis Date   Anxiety disorder    Asthma    Bipolar affective disorder (HCC)    Blood transfusion without reported diagnosis    CHF (congestive heart failure) (HCC)    COPD (chronic obstructive pulmonary disease) (HCC)    Depression    Diabetes mellitus    Migraine      Significant Hospital Events: Including procedures, antibiotic start and stop dates in addition to other pertinent events   7/5 Admit with cardiogenic vs hypovolemic   Interim History / Subjective:  Patient alert, on BiPAP, following commands/oriented Asking for something to drink/and to address pain  Objective    Blood pressure (!) 116/59, pulse 77, temperature 98.2 F (36.8 C), temperature source Oral, resp. rate 11, SpO2 98%.    Vent Mode: PCV FiO2 (%):  [60 %] 60 % Set Rate:  [15 bmp] 15 bmp PEEP:  [6 cmH20] 6 cmH20   Intake/Output Summary (Last 24 hours) at 12/14/2023 0214 Last data filed at 12/14/2023 0157 Gross per 24 hour  Intake 150 ml  Output 850 ml  Net -700 ml   There were no vitals filed for this visit.  Examination: General: acute on chronic middle aged adult  female, lying on ED stretcher on BIPAP HEENT: Normocephalic, PERRLA intact, ETT, OG  Pink MM CV: s1,s2, RRR, no MRG, No JVD  pulm: clear, diminished, tolerating BIPAP  Abs: bs active, soft  Extremities: no edema, no deformity, moves all extremities on command  Skin: no rash  Neuro: Rass 0, follows commands  GU: foley   Resolved problem list   Assessment and Plan  Shock-hypovolemic in setting of DKA versus cardiogenic, patient has a  known EF between 15 and 20%- could be mixed picture Lactic acid-6.5 initially>>3.4>>4.1  DKA Hypokalemia  Patient came in with with SVT/atrial tachycardia per EKG, along with respiratory distress, blood glucose in 705.  Clinical picture consistent with dehydration in setting of early DKA. Patient now off BiPAP, on salter-8 L, not requiring Levophed  infusion.  Also received 120 mg of IV Lasix -total urine output 850 P: Transfer to ICU for further monitoring, continuous cardiac telemetry Continue insulin  drip on Endo tool Bmet every 4 along with beta hydroxy Recommend holding further Lasix  administration at this time Received 10 meq x1 of IV potassium for K of 3.1, ordered 3 additional IV runs of potassium -Follow-up with repeat bmet, will more than likely need additional potassium replacement Repeat Lactic Acid  Start Levophed  for MAP > 65 Discussed with Cardiology, limit IV fluid administration in setting of CHF   COPD hx  Tobacco abuse  P:  Continue BIPAP as need, O2 sat goal > 92%  Place on Triple BD Therapy- yulperi, brovana , pulmicort   IS/flutter valve   HFrEF  CHF exacerbation  Known EF < 20%, beside POCUS performed by Cardiogist- EF < 20%  Received 120mg  of IVPB lasix - 850 cc mls, out  Has been receiving IV fluids- per endo tool since approximately 2000 on 7/5  Lungs clear on exam, Chest X-ray clear- no pulmonary edema  P:  Would recommend holding off further lasix  for now, assess need for diuresis daily  Limit IV fluid administration   Bipolar Depression Anxiety  P:  Restart Zoloft -  Restart ativan   Supportive Care   GOC: Patient inquiring about hospice care, palliative care- consulted on previous admits-recommendations of DNR-comfort care focus P: Palliative care consult  Continue current acute interventions with vasopressor support and BIPAP DNR/DNI   Best Practice (right click and Reselect all SmartList Selections daily)   Diet/type: clear liquids DVT  prophylaxis prophylactic heparin   Pressure ulcer(s): N/A GI prophylaxis: N/A Lines: N/A Foley:  Yes, and it is still needed Code Status: DNR/DNI Last date of multidisciplinary goals of care discussion [-updated patient in the ED at bedside on 7/6  Labs   CBC: Recent Labs  Lab 12/13/23 1922 12/13/23 1928 12/14/23 0028  WBC 12.2*  --  9.2  NEUTROABS 11.1*  --   --   HGB 13.0 15.3* 10.8*  HCT 43.1 45.0 37.2  MCV 86.9  --  90.5  PLT 232  --  168    Basic Metabolic Panel: Recent Labs  Lab 12/13/23 1922 12/13/23 1928  NA 136 135  K 3.1* 3.1*  CL 95*  --   CO2 21*  --   GLUCOSE 705*  --   BUN 11  --   CREATININE 1.22*  --   CALCIUM  8.4*  --   MG 1.8  --    GFR: CrCl cannot be calculated (Unknown ideal weight.). Recent Labs  Lab 12/13/23 1922 12/13/23 1928 12/13/23 2137 12/14/23 0028 12/14/23 0035  WBC 12.2*  --   --  9.2  --  LATICACIDVEN  --  6.5* 3.4*  --  4.1*    Liver Function Tests: Recent Labs  Lab 12/13/23 1922  AST 30  ALT 37  ALKPHOS 97  BILITOT 1.5*  PROT 5.9*  ALBUMIN  2.8*   Recent Labs  Lab 12/13/23 1922  LIPASE 29   No results for input(s): AMMONIA in the last 168 hours.  ABG    Component Value Date/Time   PHART 7.445 09/18/2022 1200   PCO2ART 36.1 09/18/2022 1200   PO2ART 116 (H) 09/18/2022 1200   HCO3 25.8 12/13/2023 1928   TCO2 27 12/13/2023 1928   ACIDBASEDEF 2.0 07/18/2023 2051   O2SAT 79 12/13/2023 1928     Coagulation Profile: Recent Labs  Lab 12/13/23 1922 12/14/23 0145  INR 1.5* 1.6*    Cardiac Enzymes: No results for input(s): CKTOTAL, CKMB, CKMBINDEX, TROPONINI in the last 168 hours.  HbA1C: Hgb A1c MFr Bld  Date/Time Value Ref Range Status  05/22/2023 08:56 PM 8.2 (H) 4.8 - 5.6 % Final    Comment:    (NOTE) Pre diabetes:          5.7%-6.4%  Diabetes:              >6.4%  Glycemic control for   <7.0% adults with diabetes   02/07/2023 03:43 AM 8.4 (H) 4.8 - 5.6 % Final    Comment:     (NOTE) Pre diabetes:          5.7%-6.4%  Diabetes:              >6.4%  Glycemic control for   <7.0% adults with diabetes     CBG: Recent Labs  Lab 12/13/23 2134 12/13/23 2220 12/13/23 2309 12/14/23 0021 12/14/23 0150  GLUCAP >600* >600* 523* 472* 363*    Review of Systems:   See HPI   Past Medical History:  She,  has a past medical history of Anxiety disorder, Asthma, Bipolar affective disorder (HCC), Blood transfusion without reported diagnosis, CHF (congestive heart failure) (HCC), COPD (chronic obstructive pulmonary disease) (HCC), Depression, Diabetes mellitus, and Migraine.   Surgical History:   Past Surgical History:  Procedure Laterality Date   KNEE ARTHROSCOPY     x 2   KNEE SURGERY Left    Incision made and knee cleaned out   TUBAL LIGATION       Social History:   reports that she has been smoking cigarettes. She has a 29 pack-year smoking history. She has never used smokeless tobacco. She reports that she does not currently use alcohol . She reports that she does not use drugs.   Family History:  Her family history includes Bipolar disorder in her mother; Diabetes in her father; Hypertension in her father.   Allergies Allergies  Allergen Reactions   Tramadol  Nausea Only and Rash   Wellbutrin [Bupropion] Other (See Comments)    Behavioral changes (aggression, irritability)   Benadryl  [Diphenhydramine ] Other (See Comments)    Decreases motor skills and increases anxiety   Lactose Intolerance (Gi) Diarrhea   Nicotine  Rash    Patient states she is allergic to the Nicotine  patch and they cause her to break out in a rash.  She states she can smoke cigarettes.     Home Medications  Prior to Admission medications   Medication Sig Start Date End Date Taking? Authorizing Provider  albuterol  (VENTOLIN  HFA) 108 (90 Base) MCG/ACT inhaler Inhale 2 puffs into the lungs every 6 (six) hours as needed for wheezing or shortness of breath. 09/27/22  Yes Arrien,  Elidia Sieving, MD  acetaminophen  (TYLENOL ) 325 MG tablet Take 2 tablets (650 mg total) by mouth every 6 (six) hours. Patient not taking: Reported on 12/14/2023 11/22/23   Tharon Lung, MD  alum & mag hydroxide-simeth (MAALOX/MYLANTA) 200-200-20 MG/5ML suspension Take 15 mLs by mouth 2 (two) times daily as needed for indigestion or heartburn. 11/22/23   Tharon Lung, MD  fentaNYL  (DURAGESIC ) 100 MCG/HR Place 1 patch onto the skin every 3 (three) days. 11/24/23   Donah Laymon PARAS, MD  fluticasone  furoate-vilanterol (BREO ELLIPTA ) 200-25 MCG/ACT AEPB Inhale 1 puff into the lungs daily. 11/22/23   Tharon Lung, MD  furosemide  (LASIX ) 20 MG tablet Take 3 tablets (60 mg total) by mouth 2 (two) times daily. 11/22/23   Tharon Lung, MD  furosemide  (LASIX ) 40 MG tablet Take 1 tablet (40 mg total) by mouth daily as needed for fluid or edema. 11/22/23 11/21/24  Tharon Lung, MD  hydrocortisone  cream 0.5 % Apply topically 4 (four) times daily as needed for itching. 11/22/23   Tharon Lung, MD  HYDROmorphone  (DILAUDID ) 4 MG tablet Take 1 tablet (4 mg total) by mouth every 3 (three) hours as needed (breakthrough pain). 11/22/23   Donah Laymon PARAS, MD  ipratropium-albuterol  (DUONEB) 0.5-2.5 (3) MG/3ML SOLN inhale 3 mLs by nebulization every 6 (six) hours as needed. Patient taking differently: Take 3 mLs by nebulization every 6 (six) hours as needed (for shortness of breath). 05/27/23   Arrien, Elidia Sieving, MD  lidocaine  (LIDODERM ) 5 % Place 1 patch onto the skin daily. Remove & Discard patch within 12 hours or as directed by MD 11/22/23   Tharon Lung, MD  LORazepam  (ATIVAN ) 1 MG tablet Take 1 tablet (1 mg total) by mouth every 4 (four) hours as needed for anxiety, seizure or sleep. 11/22/23   Tharon Lung, MD  ondansetron  (ZOFRAN -ODT) 4 MG disintegrating tablet Take 1 tablet (4 mg total) by mouth every 8 (eight) hours as needed for nausea or vomiting. 11/22/23   Tharon Lung, MD  pantoprazole  (PROTONIX ) 40 MG  tablet Take 1 tablet (40 mg total) by mouth daily. 11/22/23   Tharon Lung, MD  polyethylene glycol powder (GLYCOLAX /MIRALAX ) 17 GM/SCOOP powder Take 17 g by mouth 2 (two) times daily. 11/22/23   Tharon Lung, MD  polyethylene glycol (MIRALAX  / GLYCOLAX ) 17 g packet Take 17 g by mouth daily as needed for mild constipation, moderate constipation or severe constipation (if remain constipated). 11/22/23   Tharon Lung, MD  senna (SENOKOT) 8.6 MG TABS tablet Take 1 tablet (8.6 mg total) by mouth 2 (two) times daily. 11/22/23   Tharon Lung, MD  sertraline  (ZOLOFT ) 25 MG tablet Take 1 tablet (25 mg total) by mouth daily. 11/22/23   Tharon Lung, MD  benztropine  (COGENTIN ) 1 MG tablet Take 1 tablet (1 mg total) by mouth 2 (two) times daily. 12/23/18 06/04/20  Malinda Rogue, MD  insulin  aspart (NOVOLOG ) 100 UNIT/ML injection Inject 0-9 Units into the skin 3 (three) times daily with meals. Patient not taking: Reported on 06/03/2020 12/16/18 06/04/20  Christobal Guadalajara, MD     Critical care time: 60 mins    Christian Deamonte Sayegh AGACNP-BC   [facility] 12/14/2023, 3:42 AM  Please see [email or web address] for pager details.  From 7A-7P if no response, please call [phone]. After hours, please call ELink [phone].

## 2023-12-14 NOTE — Progress Notes (Signed)
 PHARMACY - PHYSICIAN COMMUNICATION CRITICAL VALUE ALERT - BLOOD CULTURE IDENTIFICATION (BCID)  [name] [name] is an 53 y.o. female who presented to [facility] on 12/13/2023 with a chief complaint of cardiogenic/hypovolemic shock. Patient is afebrile w/o leukocytosis. BP soft with NE @ 2 though can be explained by other causes.   Assessment:    7/5 Bcx: 1/2 bottles (aerobic) GPCs>>Identified as Staph epi with MecA detected on BCID   Name of physician (or Provider) Contacted: Valinda Novas, MD   Current antibiotics: none   Changes to prescribed antibiotics recommended: Organism is likely a contaminant- no changes recommended at this time  Recommendations accepted by provider  Results for orders placed or performed during the hospital encounter of 12/13/23  Blood Culture ID Panel (Reflexed) (Collected: 12/13/2023  7:22 PM)  Result Value Ref Range   Enterococcus faecalis NOT DETECTED NOT DETECTED   Enterococcus Faecium NOT DETECTED NOT DETECTED   Listeria monocytogenes NOT DETECTED NOT DETECTED   Staphylococcus species DETECTED (A) NOT DETECTED   Staphylococcus aureus (BCID) NOT DETECTED NOT DETECTED   Staphylococcus epidermidis DETECTED (A) NOT DETECTED   Staphylococcus lugdunensis NOT DETECTED NOT DETECTED   Streptococcus species NOT DETECTED NOT DETECTED   Streptococcus agalactiae NOT DETECTED NOT DETECTED   Streptococcus pneumoniae NOT DETECTED NOT DETECTED   Streptococcus pyogenes NOT DETECTED NOT DETECTED   A.calcoaceticus-baumannii NOT DETECTED NOT DETECTED   Bacteroides fragilis NOT DETECTED NOT DETECTED   Enterobacterales NOT DETECTED NOT DETECTED   Enterobacter cloacae complex NOT DETECTED NOT DETECTED   Escherichia coli NOT DETECTED NOT DETECTED   Klebsiella aerogenes NOT DETECTED NOT DETECTED   Klebsiella oxytoca NOT DETECTED NOT DETECTED   Klebsiella pneumoniae NOT DETECTED NOT DETECTED   Proteus species NOT DETECTED NOT DETECTED   Salmonella species NOT DETECTED NOT  DETECTED   Serratia marcescens NOT DETECTED NOT DETECTED   Haemophilus influenzae NOT DETECTED NOT DETECTED   Neisseria meningitidis NOT DETECTED NOT DETECTED   Pseudomonas aeruginosa NOT DETECTED NOT DETECTED   Stenotrophomonas maltophilia NOT DETECTED NOT DETECTED   Candida albicans NOT DETECTED NOT DETECTED   Candida auris NOT DETECTED NOT DETECTED   Candida glabrata NOT DETECTED NOT DETECTED   Candida krusei NOT DETECTED NOT DETECTED   Candida parapsilosis NOT DETECTED NOT DETECTED   Candida tropicalis NOT DETECTED NOT DETECTED   Cryptococcus neoformans/gattii NOT DETECTED NOT DETECTED   Methicillin resistance mecA/C DETECTED (A) NOT DETECTED    Massie Fila, PharmD Clinical Pharmacist  12/14/2023 5:22 PM

## 2023-12-14 NOTE — Consult Note (Addendum)
 Palliative Medicine Inpatient Consult Note  Consulting Provider: Claudene Fonda BROCKS, NP   Reason for consult:   Palliative Care Consult Services Palliative Medicine Consult  Reason for Consult? CHF Exacberation - shock/DKA, inquriing about hospice/comfort   12/14/2023  HPI:  Per intake H&P --> 53 year old female with biventricular HFrEF, insulin -dependent diabetes, tobacco dependence and bipolar disorder who was admitted early this morning with chest pain and shortness of breath. Palliative care has been asked to support [name] [name] in the setting of advanced disease and further goals of care conversations.   Clinical Assessment/Goals of Care:  *Please note that this is a verbal dictation therefore any spelling or grammatical errors are due to the Dragon Medical One system interpretation.  I have reviewed medical records including EPIC notes, labs and imaging, received report from bedside RN, assessed the patient who is lying in bed generally confused about her circumstances.    I met with [name] [name] to further discuss diagnosis prognosis, GOC, EOL wishes, disposition and options.   I introduced Palliative Medicine as specialized medical care for people living with serious illness. It focuses on providing relief from the symptoms and stress of a serious illness. The goal is to improve quality of life for both the patient and the family.  Medical History Review and Understanding:  A review of [name] [name]'s past medical history significant for COPD, NICM with EF < 20%, and severe MR was completed.   Social History:  [name] [name] is not employed at this time. She lives with her friend, [name] [name]. She has two sons and one daughter who is imprisoned. 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   Functional and Nutritional State:  [name] [name] has been only able to walk a few feet before feeling notably short of breath. She shares that she has been able to dress herself and feed herself. Her appetite is very  poor in general.   Advance Directives:  A detailed discussion was had today regarding advanced directives.  Patient shares she can make her own decisions. If she were not able to make decisions by herself she would want her friend, [name] [name] to be her surrogate Management consultant.   Code Status:  Concepts specific to code status, artifical feeding and hydration, continued IV antibiotics and rehospitalization was had.  The difference between a aggressive medical intervention path  and a palliative comfort care path for this patient at this time was had.   [name] [name] is an established DNAR/DNI code status.   Discussion:  I met with [name] [name] three times this morning. We discussed her current medical illness - more notably her heart failure. We reviewed that as of presently, [name] [name] is on aggressive measures to support her heart failure. She shares that she knows she has bad disease - we reviewed in detail the phases of heart failure and how it is a chronic incurable disease and overtime even with medication management it will worsen.  We reviewed that over the last year, [name] [name] has had worsening disease burden. She shares that she has had worsening shortness of breath with simple activities.   Extensive conversations were held about hospice care in the home and what this would entail.  We alternatively discussed hospice care at [facility] and the emphasis on comfort measures.   Plan to allow a few days of treatment to see if patient can be medically optimized and if so to then transition home with hospice care.   Discussed the importance of continued conversation with family and their  medical providers  regarding overall plan of care and treatment options, ensuring decisions are within the context of the patients values and GOCs.  Decision Maker: Patient shares she can make decisions for herself though if needed she would want her friend, [name] [name] to be her Management consultant.   SUMMARY OF RECOMMENDATIONS    DNAR/DNI  Appreciate Chaplain helping with HCPOA documents  Plan to allow for a few days of treatment - if patient improves then she will go home with hospice if she worsens then she will be made comfort care and go to [facility]  Ongoing PMT support  Code Status/Advance Care Planning: DNAR/DNI  Palliative Prophylaxis:  Aspiration, Bowel Regimen, Delirium Protocol, Frequent Pain Assessment, Oral Care, Palliative Wound Care, and Turn Reposition  Additional Recommendations (Limitations, Scope, Preferences): Continue current care  Psycho-social/Spiritual:  Desire for further Chaplaincy support: Yes Additional Recommendations: Education on hospice support in home versus inpatient   Prognosis: Limited overall in the setting of HF with EF < 20%  Discharge Planning: To be determined  Vitals:   12/14/23 1000 12/14/23 1015  BP: 105/66 101/60  Pulse: 86 84  Resp: 16 18  Temp:    SpO2: 98% 99%    Intake/Output Summary (Last 24 hours) at 12/14/2023 1047 Last data filed at 12/14/2023 1000 Gross per 24 hour  Intake 1981.72 ml  Output 850 ml  Net 1131.72 ml   Gen:  Middle aged Caucasian F chronically ill appearing HEENT: moist mucous membranes CV: Regular rate and rhythm  PULM:  On 2LPM [place], breathing is labored ABD: soft/nontender  EXT: No edema  Neuro: Alert and oriented x2-3 - needs some clarification  PPS: 30-40%   This conversation/these recommendations were discussed with patient primary care team, Dr. Harold ______________________________________________________ Rosaline Becton [facility] Palliative Medicine Team Team Cell Phone: [phone] Please utilize secure chat with additional questions, if there is no response within 30 minutes please call the above phone number  Total Time: 125 Billing based on MDM: High  Palliative Medicine Team providers are available by phone from 7am to 7pm daily and can be reached through the team cell phone.  Should this patient  require assistance outside of these hours, please call the patient's attending physician.

## 2023-12-14 NOTE — Progress Notes (Signed)
 Patient refused ABG, RN notified

## 2023-12-14 NOTE — ED Notes (Signed)
 Pt unable to tolerate Potassium IV due to complaining of pain and burning in vein. Provider made aware. Pt unable to have increased fluid rate at this time.

## 2023-12-14 NOTE — Progress Notes (Signed)
 53 year old female with biventricular HFrEF, insulin -dependent diabetes, tobacco dependence and bipolar disorder who was admitted early this morning with chest pain and shortness of breath. Noted to be in SVT, DKA and acute respiratory failure with hypoxia  DKA is corrected, she was transitioned from insulin  infusion to long-acting and sliding scale Electrolytes are being corrected Continue diuresis She is on 2 L nasal cannula oxygen , off BiPAP Currently in sinus rhythm  She was on hospice before, would like to transition to hospice, palliative care and hospice consulted.  Continue current treatment for now until she transition to comfort care    Valinda Novas, MD

## 2023-12-14 NOTE — ED Notes (Addendum)
 Per Sidra Sharps, PA, Insulin  IV to be discontinued at this time.

## 2023-12-14 NOTE — ED Notes (Signed)
 Per admitting provider, will await last BMP results prior to administering Potassium IV

## 2023-12-15 DIAGNOSIS — R570 Cardiogenic shock: Secondary | ICD-10-CM

## 2023-12-15 DIAGNOSIS — Z515 Encounter for palliative care: Secondary | ICD-10-CM | POA: Diagnosis not present

## 2023-12-15 DIAGNOSIS — I5023 Acute on chronic systolic (congestive) heart failure: Secondary | ICD-10-CM

## 2023-12-15 DIAGNOSIS — R739 Hyperglycemia, unspecified: Secondary | ICD-10-CM

## 2023-12-15 DIAGNOSIS — Z7189 Other specified counseling: Secondary | ICD-10-CM | POA: Diagnosis not present

## 2023-12-15 DIAGNOSIS — N1831 Chronic kidney disease, stage 3a: Secondary | ICD-10-CM

## 2023-12-15 DIAGNOSIS — J810 Acute pulmonary edema: Secondary | ICD-10-CM

## 2023-12-15 LAB — BASIC METABOLIC PANEL WITH GFR
Anion gap: 10 (ref 5–15)
Anion gap: 12 (ref 5–15)
BUN: 30 mg/dL — ABNORMAL HIGH (ref 6–20)
BUN: 32 mg/dL — ABNORMAL HIGH (ref 6–20)
CO2: 29 mmol/L (ref 22–32)
CO2: 24 mmol/L (ref 22–32)
Calcium: 7.7 mg/dL — ABNORMAL LOW (ref 8.9–10.3)
Calcium: 7.7 mg/dL — ABNORMAL LOW (ref 8.9–10.3)
Chloride: 98 mmol/L (ref 98–111)
Chloride: 99 mmol/L (ref 98–111)
Creatinine, Ser: 1.25 mg/dL — ABNORMAL HIGH (ref 0.44–1.00)
Creatinine, Ser: 1.22 mg/dL — ABNORMAL HIGH (ref 0.44–1.00)
GFR, Estimated: 51 mL/min — ABNORMAL LOW (ref >60)
GFR, Estimated: 53 mL/min — ABNORMAL LOW (ref >60)
Glucose, Bld: 277 mg/dL — ABNORMAL HIGH (ref 70–99)
Glucose, Bld: 216 mg/dL — ABNORMAL HIGH (ref 70–99)
Potassium: 4.1 mmol/L (ref 3.5–5.1)
Potassium: 4.7 mmol/L (ref 3.5–5.1)
Sodium: 137 mmol/L (ref 135–145)
Sodium: 135 mmol/L (ref 135–145)

## 2023-12-15 LAB — BETA-HYDROXYBUTYRIC ACID
Beta-Hydroxybutyric Acid: 0.08 mmol/L (ref 0.05–0.27)
Beta-Hydroxybutyric Acid: 0.1 mmol/L (ref 0.05–0.27)

## 2023-12-15 LAB — GLUCOSE, CAPILLARY
Glucose-Capillary: 215 mg/dL — ABNORMAL HIGH (ref 70–99)
Glucose-Capillary: 246 mg/dL — ABNORMAL HIGH (ref 70–99)
Glucose-Capillary: 259 mg/dL — ABNORMAL HIGH (ref 70–99)

## 2023-12-15 MED ORDER — HALOPERIDOL 0.5 MG PO TABS: 0.5000 mg | ORAL_TABLET | ORAL | Status: DC | PRN

## 2023-12-15 MED ORDER — LORAZEPAM 2 MG/ML IJ SOLN
0.5000 mg | INTRAMUSCULAR | Status: DC | PRN
  Administered 2023-12-15: 1 mg via INTRAVENOUS
  Filled 2023-12-15: qty 1

## 2023-12-15 MED ORDER — ONDANSETRON HCL 4 MG/2ML IJ SOLN
4.0000 mg | Freq: Four times a day (QID) | INTRAMUSCULAR | Status: DC | PRN
  Administered 2023-12-15: 4 mg via INTRAVENOUS
  Filled 2023-12-15: qty 2

## 2023-12-15 MED ORDER — SODIUM CHLORIDE 0.9 % IV SOLN
1.0000 mg/h | INTRAVENOUS | Status: DC
  Administered 2023-12-15: 4 mg/h via INTRAVENOUS
  Administered 2023-12-15: 1 mg/h via INTRAVENOUS
  Filled 2023-12-15 (×2): qty 5

## 2023-12-15 MED ORDER — HALOPERIDOL LACTATE 5 MG/ML IJ SOLN
0.5000 mg | INTRAMUSCULAR | Status: DC | PRN
Start: 2023-12-15 — End: 2023-12-15

## 2023-12-15 MED ORDER — ONDANSETRON 4 MG PO TBDP
4.0000 mg | ORAL_TABLET | Freq: Four times a day (QID) | ORAL | 0 refills | Status: AC | PRN
Start: 2023-12-15 — End: ?

## 2023-12-15 MED ORDER — HALOPERIDOL LACTATE 2 MG/ML PO CONC
0.5000 mg | ORAL | Status: DC | PRN
Start: 2023-12-15 — End: 2023-12-15

## 2023-12-15 MED ORDER — OXYCODONE HCL 5 MG PO TABS
5.0000 mg | ORAL_TABLET | ORAL | Status: DC | PRN
  Administered 2023-12-15: 5 mg via ORAL
  Filled 2023-12-15: qty 1

## 2023-12-15 MED ORDER — GLYCOPYRROLATE 0.2 MG/ML IJ SOLN: 0.2000 mg | INTRAMUSCULAR | Status: DC | PRN

## 2023-12-15 MED ORDER — ACETAMINOPHEN 650 MG RE SUPP: 650.0000 mg | Freq: Four times a day (QID) | RECTAL | Status: DC | PRN

## 2023-12-15 MED ORDER — ACETAMINOPHEN 325 MG PO TABS: 650.0000 mg | ORAL_TABLET | Freq: Four times a day (QID) | ORAL | Status: DC | PRN

## 2023-12-15 MED ORDER — POLYVINYL ALCOHOL 1.4 % OP SOLN
1.0000 [drp] | Freq: Four times a day (QID) | OPHTHALMIC | Status: DC | PRN
Start: 2023-12-15 — End: 2023-12-15

## 2023-12-15 MED ORDER — SODIUM CHLORIDE 0.9 % IV SOLN: 1.0000 mg/h | INTRAVENOUS | Status: AC

## 2023-12-15 MED ORDER — POLYVINYL ALCOHOL 1.4 % OP SOLN: 1.0000 [drp] | Freq: Four times a day (QID) | OPHTHALMIC | 0 refills | Status: AC | PRN

## 2023-12-15 MED ORDER — GLYCOPYRROLATE 1 MG PO TABS: 1.0000 mg | ORAL_TABLET | ORAL | Status: AC | PRN

## 2023-12-15 MED ORDER — BIOTENE DRY MOUTH MT LIQD: 15.0000 mL | OROMUCOSAL | Status: DC | PRN

## 2023-12-15 MED ORDER — BIOTENE DRY MOUTH MT LIQD: 15.0000 mL | OROMUCOSAL | Status: AC | PRN

## 2023-12-15 MED ORDER — ACETAMINOPHEN 325 MG PO TABS: 650.0000 mg | ORAL_TABLET | Freq: Four times a day (QID) | ORAL | Status: AC | PRN

## 2023-12-15 MED ORDER — ALPRAZOLAM 0.5 MG PO TABS
0.5000 mg | ORAL_TABLET | Freq: Once | ORAL | Status: AC
  Administered 2023-12-15: 0.5 mg via ORAL
  Filled 2023-12-15: qty 1

## 2023-12-15 MED ORDER — HYDROMORPHONE BOLUS VIA INFUSION
1.0000 mg | INTRAVENOUS | Status: DC | PRN
  Administered 2023-12-15 (×8): 1 mg via INTRAVENOUS

## 2023-12-15 MED ORDER — GLYCOPYRROLATE 1 MG PO TABS: 1.0000 mg | ORAL_TABLET | ORAL | Status: DC | PRN

## 2023-12-15 MED ORDER — ONDANSETRON 4 MG PO TBDP: 4.0000 mg | ORAL_TABLET | Freq: Four times a day (QID) | ORAL | Status: DC | PRN

## 2023-12-15 NOTE — Plan of Care (Signed)
  Problem: Nutrition: Goal: Adequate nutrition will be maintained Outcome: Progressing   Problem: Elimination: Goal: Will not experience complications related to bowel motility Outcome: Progressing   Problem: Pain Managment: Goal: General experience of comfort will improve and/or be controlled Outcome: Progressing   Problem: Safety: Goal: Ability to remain free from injury will improve Outcome: Progressing   Problem: Skin Integrity: Goal: Risk for impaired skin integrity will decrease Outcome: Progressing

## 2023-12-15 NOTE — TOC Transition Note (Signed)
 Transition of Care [facility]) - Discharge Note   Patient Details  Name: [name] [name] MRN: [id number] Date of Birth: 06-10-1971  Transition of Care [facility]) CM/SW Contact:  Isaiah Public, LCSWA Phone Number: 12/15/2023, 3:56 PM   Clinical Narrative:     Patient will DC to: [facility]  Anticipated DC date: 12/15/2023  Family notified: Dorise   Transport by: [place]  ?  Per MD patient ready for DC to [facility] . RN, patient, patient's family, and facility notified of DC. Discharge Summary sent to facility. RN given number for report [phone]. DC packet on chart. DNR signed by MD attached to patients DC packet.Ambulance transport requested for patient.  CSW signing off.   Final next level of care: [facility]) Barriers to Discharge: No Barriers Identified   Patient Goals and CMS Choice Patient states their goals for this hospitalization and ongoing recovery are:: [facility]   Choice offered to / list presented to : Patient      Discharge Placement              Patient chooses bed at:  [facility]) Patient to be transferred to facility by: [facility] Name of family member notified: Dorise Patient and family notified of of transfer: 12/15/23  Discharge Plan and Services Additional resources added to the After Visit Summary for                                       Social Drivers of Health (SDOH) Interventions SDOH Screenings   Food Insecurity: Food Insecurity Present (12/14/2023)  Housing: High Risk (12/14/2023)  Transportation Needs: Unmet Transportation Needs (12/14/2023)  Utilities: Not At Risk (12/14/2023)  Alcohol  Screen: Low Risk  (02/12/2023)  Depression (PHQ2-9): High Risk (09/30/2022)  Financial Resource Strain: High Risk (09/30/2022)  Social Connections: Moderately Isolated (11/15/2023)  Stress: No Stress Concern Present (01/07/2022)   Received from [facility]  Tobacco Use: High Risk (12/13/2023)     Readmission Risk  Interventions    07/23/2023   12:02 PM 07/10/2023    1:21 PM 02/12/2023    5:00 PM  Readmission Risk Prevention Plan  Transportation Screening Complete Complete Complete  Medication Review Oceanographer) Complete Complete   PCP or Specialist appointment within 3-5 days of discharge Complete Complete   HRI or Home Care Consult Complete Complete   SW Recovery Care/Counseling Consult Complete Patient refused   Palliative Care Screening Not Applicable Not Applicable   Skilled Nursing Facility Not Applicable Not Applicable

## 2023-12-15 NOTE — Progress Notes (Addendum)
 eLink Physician-Brief Progress Note Patient Name: BRYANDA MIKEL DOB: 09/16/1970 MRN: [id number]   Date of Service  12/15/2023  HPI/Events of Note  RN asked of thr frequency of dilaudid  or oxycodone  can be changed to help with dyspnea and pain when deep inspiration. Dilaudid  IV given last at around 11 pm and oral oxy around 10 pm  eICU Interventions  Change oxy to every 4 hours as needed      Intervention Category Intermediate Interventions: Pain - evaluation and management  Toddy Boyd G Vedant Shehadeh 12/15/2023, 2:29 AM  Addendum at 5:13 am - Very anxious and asking for something. Low dose Xanax  ordered.

## 2023-12-15 NOTE — Inpatient Diabetes Management (Signed)
 Inpatient Diabetes Program Recommendations  AACE/ADA: New Consensus Statement on Inpatient Glycemic Control (2015)  Target Ranges:  Prepandial:   less than 140 mg/dL      Peak postprandial:   less than 180 mg/dL (1-2 hours)      Critically ill patients:  140 - 180 mg/dL    Latest Reference Range & Units 12/14/23 01:50 12/14/23 03:25 12/14/23 04:38 12/14/23 05:57 12/14/23 09:18 12/14/23 11:48 12/14/23 15:32 12/14/23 19:52  Glucose-Capillary 70 - 99 mg/dL 636 (H)  IV Insulin  Drip Running 259 (H) 156 (H) 112 (H)  8 units Semglee  @0639   IV Insulin  Drip Stopped @0650  183 (H)  4 units Novolog   328 (H)  11 units Novolog   331 (H)  11 units Novolog   309 (H)  11 units Novolog   8 units Semglee  @2213   (H): Data is abnormally high  Latest Reference Range & Units 12/15/23 00:41 12/15/23 04:23  Glucose-Capillary 70 - 99 mg/dL 740 (H)  8 units Novolog   246 (H)  5 units Novolog    (H): Data is abnormally high    Home Meds: None Listed   Current Orders: Semglee  8 units BID    Novolog  Moderate Correction Scale/ SSI (0-15 units) Q4 hours     Transitioned from IV to SQ Insulin  yest AM Semglee  8 units BID + Novolog  0-15 Q4H     MD- Note CBGs remain >200  If within goals of care, please consider:  Increase Semglee  to 11 units BID (0.3 units/kg)    --Will follow patient during hospitalization--  Adina Rudolpho Arrow RN, MSN, CDCES Diabetes Coordinator Inpatient Glycemic Control Team Team Pager: [phone] (8a-5p)

## 2023-12-15 NOTE — Progress Notes (Addendum)
 This chaplain responded to PMT NP-Michelle's consult for EOL spiritual care. The Pt. is awake and shared her anxiety in the unknown along with the pain in her chest. The chaplain checked in at the end of the visit with the Pt., the Pt. described the anxiety and pain as reduced. The Pt. preferred name is [name] [name].  The chaplain listened reflectively as the Pt. talked about her disease and the intersections of her family and faith in the EOL journey. The Pt. practiced mindfulness as a place of comfort with the chaplain. Prayer was accepted by the Pt. along with a Diet Ginger Ale.  This chaplain is available for F/U spiritual care as needed.  Chaplain Leeroy Hummer [phone]

## 2023-12-15 NOTE — Progress Notes (Addendum)
 NAME:  [name] [name], MRN:  [id number], DOB:  03-Feb-1971, LOS: 1 ADMISSION DATE:  12/13/2023, CONSULTATION DATE: 12/13/23 REFERRING MD:  Armenta CHIEF COMPLAINT:  Chest Pain   History of Present Illness:  Patient is a 53 year old female with significant past medical history of HFrEF-EF less than 20%, NICM severe mitral valve regurgitation, COPD, diabetes depression, bipolar disorder type I, anxiety, and on/off hospice care who presents to [facility] ED on 7/5 with central chest pain radiating to right shoulder as well as dyspnea.  Upon ED arrival patient was placed on a nonrebreather mask and subsequently had to be placed on BiPAP due to worsening respiratory distress.  Originally code STEMI was called and cardiology was consulted.  Cardiology performed POCUS at bedside-with findings of LV enlargement/EF less than 15%.  EKG not indicative of STEMI but SVT noted with LVH.  Clinical picture consistent with acute CHF exacerbation with cardiogenic shock vs hypovolemic shock. Patient also found to not have been taking insulin  for the past week due to running out of medication.  Patient was started on Levophed  for hypotension, started on diuresis, and insulin  drip for hyperglycemia/DKA.  PCCM consulted for admission and further management.  Initial ED workup-sodium 136, potassium 3.1, chloride 95, CO2 21, glucose 705, creatinine 1.22, calcium  8.4, BNP greater than 4500, high-sensitivity troponin 63, lactic acid 6.5, WBC 9.2, hemoglobin 10.8  Upon assessment of patient, patient alert and oriented x 4, still having central chest pain.  Patient endorsed that chest pain had been ongoing for the last 2 to 3 days with progression.  Patient states that she has been in and out of hospice due to her heart failure.  Patient also asking for pain to be addressed. Discussed with patient goals of care in regards to patient's severe heart failure-EF 15 to 20%.  Patient would like to focus on quality of life versus quantity  and pursue hospice care. Discussed with patient that with her being on BiPAP as well as needing vasopressor support that hospital death could be likely.  Recommend focusing on quality of life versus quantity and eventually transition to comfort once she is ready.  Patient would like to wait on son to arrive before transitioning to comfort care.   Pertinent  Medical History   Past Medical History:  Diagnosis Date   Anxiety disorder    Asthma    Bipolar affective disorder (HCC)    Blood transfusion without reported diagnosis    CHF (congestive heart failure) (HCC)    COPD (chronic obstructive pulmonary disease) (HCC)    Depression    Diabetes mellitus    Migraine      Significant Hospital Events: Including procedures, antibiotic start and stop dates in addition to other pertinent events   7/5 Admit with cardiogenic vs hypovolemic shock 7/7 now transitioned to comfort care on dilaudid  infusion   Interim History / Subjective:  Pt is alert and oriented, transitioned to comfort care  Still c/o some chest pain   Objective    Blood pressure (!) 89/66, pulse 92, temperature 97.8 F (36.6 C), temperature source Oral, resp. rate 12, weight 75.7 kg, SpO2 94%.    FiO2 (%):  [32 %] 32 %   Intake/Output Summary (Last 24 hours) at 12/15/2023 1057 Last data filed at 12/15/2023 0600 Gross per 24 hour  Intake 1429.78 ml  Output 0 ml  Net 1429.78 ml   Filed Weights   12/14/23 1420 12/15/23 0500  Weight: 72.4 kg 75.7 kg    General:  ill appearing F resting in bed in NAD  HEENT: MM pink/moist Neuro: alert and oriented  CV: s1s2 rrr, no m/r/g PULM:  diminished in the bilateral bases  GI: soft, bsx4 active  Extremities: warm/dry, 1+ edema  Skin: no rashes or lesions    Resolved problem list   Assessment and Plan  Shock-hypovolemic in setting of DKA versus cardiogenic, patient has a known EF between 15 and 20%- could be mixed picture Lactic acid-6.5 initially>>3.4>>4.1   DKA Hypokalemia  COPD hx  Tobacco abuse  HFrEF  CHF exacerbation  Bipolar Depression Anxiety  Patient came in with with SVT/atrial tachycardia per EKG, along with respiratory distress, blood glucose in 705.  Clinical picture consistent with dehydration in setting of early DKA. Patient now off BiPAP, on salter-8 L, not requiring Levophed  infusion.  Also received 120 mg of IV Lasix -total urine output 850 P: -palliative care is following, pt has made the decision to transition to comfort care and orders have been placed -titrate Dilaudid  gtt prn  -no current air hunger -prn Ativan  for anxiety  Best Practice (right click and Reselect all SmartList Selections daily)   Diet/type: Regular consistency (see orders) DVT prophylaxis prophylactic heparin   Pressure ulcer(s): N/A GI prophylaxis: N/A Lines: N/A Foley:  Yes, and it is still needed Code Status: DNR/DNI Last date of multidisciplinary goals of care discussion [palliative care 7/7]  Labs   CBC: Recent Labs  Lab 12/13/23 1922 12/13/23 1928 12/14/23 0028 12/14/23 0215  WBC 12.2*  --  9.2 8.2  NEUTROABS 11.1*  --   --   --   HGB 13.0 15.3* 10.8* 10.5*  HCT 43.1 45.0 37.2 35.2*  MCV 86.9  --  90.5 85.4  PLT 232  --  168 183    Basic Metabolic Panel: Recent Labs  Lab 12/13/23 1922 12/13/23 1928 12/14/23 0215 12/14/23 0630 12/14/23 1300 12/14/23 1638 12/14/23 2133 12/15/23 0218 12/15/23 0500  NA 136   < > 137   < > 133* 133* 136 137 135  K 3.1*   < > 2.5*   < > 3.2* 3.8 3.6 4.1 4.7  CL 95*  --  97*   < > 95* 97* 98 98 99  CO2 21*  --  28   < > 27 27 29 29 24   GLUCOSE 705*  --  339*   < > 332* 336* 277* 277* 216*  BUN 11  --  13   < > 18 22* 27* 30* 32*  CREATININE 1.22*  --  1.14*   < > 1.18* 1.51* 1.30* 1.25* 1.22*  CALCIUM  8.4*  --  7.8*   < > 7.8* 8.1* 7.8* 7.7* 7.7*  MG 1.8  --  1.6*  --   --   --   --   --   --   PHOS  --   --  1.9*  --   --   --   --   --   --    < > = values in this interval not  displayed.   GFR: Estimated Creatinine Clearance: 52.5 mL/min (A) (by C-G formula based on SCr of 1.22 mg/dL (H)). Recent Labs  Lab 12/13/23 1922 12/13/23 1928 12/14/23 0028 12/14/23 0035 12/14/23 0215 12/14/23 0630 12/14/23 0822 12/14/23 1020  WBC 12.2*  --  9.2  --  8.2  --   --   --   LATICACIDVEN  --    < >  --  4.1*  --  2.5* 1.7  1.8   < > = values in this interval not displayed.    Liver Function Tests: Recent Labs  Lab 12/13/23 1922  AST 30  ALT 37  ALKPHOS 97  BILITOT 1.5*  PROT 5.9*  ALBUMIN  2.8*   Recent Labs  Lab 12/13/23 1922  LIPASE 29   No results for input(s): AMMONIA in the last 168 hours.  ABG    Component Value Date/Time   PHART 7.445 09/18/2022 1200   PCO2ART 36.1 09/18/2022 1200   PO2ART 116 (H) 09/18/2022 1200   HCO3 25.8 12/13/2023 1928   TCO2 27 12/13/2023 1928   ACIDBASEDEF 2.0 07/18/2023 2051   O2SAT 79 12/13/2023 1928     Coagulation Profile: Recent Labs  Lab 12/13/23 1922 12/14/23 0145  INR 1.5* 1.6*    Cardiac Enzymes: No results for input(s): CKTOTAL, CKMB, CKMBINDEX, TROPONINI in the last 168 hours.  HbA1C: Hgb A1c MFr Bld  Date/Time Value Ref Range Status  12/14/2023 12:28 PM 11.6 (H) 4.8 - 5.6 % Final    Comment:    (NOTE) Diagnosis of Diabetes The following HbA1c ranges recommended by the American Diabetes Association (ADA) may be used as an aid in the diagnosis of diabetes mellitus.  Hemoglobin             Suggested A1C NGSP%              Diagnosis  <5.7                   Non Diabetic  5.7-6.4                Pre-Diabetic  >6.4                   Diabetic  <7.0                   Glycemic control for                       adults with diabetes.    05/22/2023 08:56 PM 8.2 (H) 4.8 - 5.6 % Final    Comment:    (NOTE) Pre diabetes:          5.7%-6.4%  Diabetes:              >6.4%  Glycemic control for   <7.0% adults with diabetes     CBG: Recent Labs  Lab 12/14/23 1532 12/14/23 1952  12/15/23 0041 12/15/23 0423 12/15/23 0807  GLUCAP 331* 309* 259* 246* 215*    Review of Systems:   See HPI   Past Medical History:  She,  has a past medical history of Anxiety disorder, Asthma, Bipolar affective disorder (HCC), Blood transfusion without reported diagnosis, CHF (congestive heart failure) (HCC), COPD (chronic obstructive pulmonary disease) (HCC), Depression, Diabetes mellitus, and Migraine.   Surgical History:   Past Surgical History:  Procedure Laterality Date   KNEE ARTHROSCOPY     x 2   KNEE SURGERY Left    Incision made and knee cleaned out   TUBAL LIGATION       Social History:   reports that she has been smoking cigarettes. She has a 29 pack-year smoking history. She has never used smokeless tobacco. She reports that she does not currently use alcohol . She reports that she does not use drugs.   Family History:  Her family history includes Bipolar disorder in her mother; Diabetes in her father; Hypertension in her father.   Allergies  Allergies  Allergen Reactions   Tramadol  Nausea Only and Rash   Wellbutrin [Bupropion] Other (See Comments)    Behavioral changes (aggression, irritability)   Benadryl  [Diphenhydramine ] Other (See Comments)    Decreases motor skills and increases anxiety   Lactose Intolerance (Gi) Diarrhea   Nicotine  Rash    Patient states she is allergic to the Nicotine  patch and they cause her to break out in a rash.  She states she can smoke cigarettes.     Home Medications  Prior to Admission medications   Medication Sig Start Date End Date Taking? Authorizing Provider  albuterol  (VENTOLIN  HFA) 108 (90 Base) MCG/ACT inhaler Inhale 2 puffs into the lungs every 6 (six) hours as needed for wheezing or shortness of breath. 09/27/22  Yes Arrien, Mauricio Daniel, MD  acetaminophen  (TYLENOL ) 325 MG tablet Take 2 tablets (650 mg total) by mouth every 6 (six) hours. Patient not taking: Reported on 12/14/2023 11/22/23   Tharon Lung, MD  alum &  mag hydroxide-simeth (MAALOX/MYLANTA) 200-200-20 MG/5ML suspension Take 15 mLs by mouth 2 (two) times daily as needed for indigestion or heartburn. 11/22/23   Tharon Lung, MD  fentaNYL  (DURAGESIC ) 100 MCG/HR Place 1 patch onto the skin every 3 (three) days. 11/24/23   Donah Laymon PARAS, MD  fluticasone  furoate-vilanterol (BREO ELLIPTA ) 200-25 MCG/ACT AEPB Inhale 1 puff into the lungs daily. 11/22/23   Tharon Lung, MD  furosemide  (LASIX ) 20 MG tablet Take 3 tablets (60 mg total) by mouth 2 (two) times daily. 11/22/23   Tharon Lung, MD  furosemide  (LASIX ) 40 MG tablet Take 1 tablet (40 mg total) by mouth daily as needed for fluid or edema. 11/22/23 11/21/24  Tharon Lung, MD  hydrocortisone  cream 0.5 % Apply topically 4 (four) times daily as needed for itching. 11/22/23   Tharon Lung, MD  HYDROmorphone  (DILAUDID ) 4 MG tablet Take 1 tablet (4 mg total) by mouth every 3 (three) hours as needed (breakthrough pain). 11/22/23   Donah Laymon PARAS, MD  ipratropium-albuterol  (DUONEB) 0.5-2.5 (3) MG/3ML SOLN inhale 3 mLs by nebulization every 6 (six) hours as needed. Patient taking differently: Take 3 mLs by nebulization every 6 (six) hours as needed (for shortness of breath). 05/27/23   Arrien, Elidia Sieving, MD  lidocaine  (LIDODERM ) 5 % Place 1 patch onto the skin daily. Remove & Discard patch within 12 hours or as directed by MD 11/22/23   Tharon Lung, MD  LORazepam  (ATIVAN ) 1 MG tablet Take 1 tablet (1 mg total) by mouth every 4 (four) hours as needed for anxiety, seizure or sleep. 11/22/23   Tharon Lung, MD  ondansetron  (ZOFRAN -ODT) 4 MG disintegrating tablet Take 1 tablet (4 mg total) by mouth every 8 (eight) hours as needed for nausea or vomiting. 11/22/23   Tharon Lung, MD  pantoprazole  (PROTONIX ) 40 MG tablet Take 1 tablet (40 mg total) by mouth daily. 11/22/23   Tharon Lung, MD  polyethylene glycol powder (GLYCOLAX /MIRALAX ) 17 GM/SCOOP powder Take 17 g by mouth 2 (two) times daily. 11/22/23   Tharon Lung, MD  polyethylene glycol (MIRALAX  / GLYCOLAX ) 17 g packet Take 17 g by mouth daily as needed for mild constipation, moderate constipation or severe constipation (if remain constipated). 11/22/23   Tharon Lung, MD  senna (SENOKOT) 8.6 MG TABS tablet Take 1 tablet (8.6 mg total) by mouth 2 (two) times daily. 11/22/23   Tharon Lung, MD  sertraline  (ZOLOFT ) 25 MG tablet Take 1 tablet (25 mg total) by mouth daily. 11/22/23   Tharon Lung,  MD  benztropine  (COGENTIN ) 1 MG tablet Take 1 tablet (1 mg total) by mouth 2 (two) times daily. 12/23/18 06/04/20  Malinda Rogue, MD  insulin  aspart (NOVOLOG ) 100 UNIT/ML injection Inject 0-9 Units into the skin 3 (three) times daily with meals. Patient not taking: Reported on 06/03/2020 12/16/18 06/04/20  Christobal Guadalajara, MD     Critical care time:       Leita SAUNDERS Nanami Whitelaw, PA-C [facility] See Amion for pager If no response to pager , please call [phone] until 7pm After 7:00 pm call Elink  [phone]

## 2023-12-15 NOTE — TOC Initial Note (Signed)
 Transition of Care [facility]) - Initial/Assessment Note    Patient Details  Name: [name] [name] MRN: [id number] Date of Birth: 1970/08/06  [facility]) CM/SW Contact:    Isaiah Public, LCSWA Phone Number: 12/15/2023, 3:43 PM  Clinical Narrative:                  CSW informed by Rosaline NP with palliative patient agreeable to go to [facility] place and that referral was made to [facility] with [facility] for [facility] place [facility] patient. Melissa with [facility] informed CSW that they do have a bed available today. Eleanor will let CSW know once consents have been signed by patient.  Melissa with [facility] confirmed consents have been signed. CSW informed MD.   Barriers to Discharge: No Barriers Identified   Patient Goals and CMS Choice Patient states their goals for this hospitalization and ongoing recovery are:: [facility]   Choice offered to / list presented to : Patient      Expected Discharge Plan and Services                                              Prior Living Arrangements/Services                       Activities of Daily Living      Permission Sought/Granted                  Emotional Assessment              Admission diagnosis:  Cardiogenic shock (HCC) [R57.0] Hyperglycemia [R73.9] Dyspnea, unspecified type [R06.00] Patient Active Problem List   Diagnosis Date Noted   Cardiogenic shock (HCC) 12/14/2023   Chronic health problem 11/14/2023   Goals of care, counseling/discussion 07/23/2023   Acute exacerbation of congestive heart failure (HCC) 07/21/2023   GAD (generalized anxiety disorder) 07/18/2023   Acute on chronic combined systolic (congestive) and diastolic (congestive) heart failure (HCC) 07/18/2023   Bipolar 1 disorder (HCC) 07/06/2023   Chronic pain syndrome 05/25/2023   Heart failure (HCC) 05/23/2023   CHF (congestive heart failure) (HCC) 05/22/2023   Hypomagnesemia 05/22/2023   Severe mitral  regurgitation 09/17/2022   CKD stage 3a, GFR 45-59 ml/min (HCC) 09/17/2022   Type 2 diabetes mellitus with hyperglycemia (HCC) 08/30/2022   Hyperthyroidism 08/29/2022   AKI (acute kidney injury) (HCC) 08/21/2022   COPD (chronic obstructive pulmonary disease) (HCC) 08/04/2022   Chronic systolic CHF (congestive heart failure) (HCC) 08/03/2022   Acute on chronic systolic CHF (congestive heart failure) (HCC) 05/15/2022   Overdose, intentional self-harm, initial encounter (HCC) 12/13/2018   DM2 (diabetes mellitus, type 2) (HCC) 01/03/2018   HTN (hypertension) 08/28/2015   PCP:  [facility]:   [id number] [address], [place] - [address] AT [facility] OF [facility] & [address] [place] [address] Phone: [phone] Fax: [phone]  [facility] [address] [place] [address] Phone: [phone] Fax: [phone]  DARRYLE LONG - [facility] [address] [place] [address] Phone: [phone] Fax: [phone]     Social Drivers of Health (SDOH) Social History: SDOH Screenings   Food Insecurity: Food Insecurity Present (12/14/2023)  Housing: High Risk (12/14/2023)  Transportation Needs: Unmet Transportation Needs (12/14/2023)  Utilities:  Not At Risk (12/14/2023)  Alcohol  Screen: Low Risk  (02/12/2023)  Depression (PHQ2-9): High Risk (09/30/2022)  Financial Resource Strain: High Risk (09/30/2022)  Social Connections: Moderately Isolated (11/15/2023)  Stress: No Stress Concern Present (01/07/2022)   Received from [facility]  Tobacco Use: High Risk (12/13/2023)   SDOH Interventions:     Readmission Risk Interventions    07/23/2023   12:02 PM 07/10/2023    1:21 PM 02/12/2023    5:00 PM  Readmission Risk Prevention Plan  Transportation Screening Complete Complete Complete  Medication Review Oceanographer) Complete Complete   PCP or Specialist appointment  within 3-5 days of discharge Complete Complete   HRI or Home Care Consult Complete Complete   SW Recovery Care/Counseling Consult Complete Patient refused   Palliative Care Screening Not Applicable Not Applicable   Skilled Nursing Facility Not Applicable Not Applicable

## 2023-12-15 NOTE — Discharge Summary (Signed)
 Physician Discharge Summary         Patient ID: [name] [name] MRN: [id number] DOB/AGE: 09/13/1970 53 y.o.  Admit date: 12/13/2023 Discharge date: 12/15/2023  Discharge Diagnoses:    Active Hospital Problems   Diagnosis Date Noted   Cardiogenic shock [facility]) 12/14/2023    Resolved Hospital Problems  No resolved problems to display.      Discharge summary    [name] [name] is a 53 year old female with significant past medical history of HFrEF-EF less than 20%, NICM severe mitral valve regurgitation, COPD, diabetes depression, bipolar disorder type I, anxiety, and on/off hospice care who presents to [facility], ED on 7/5 with central chest pain radiating to right shoulder as well as dyspnea.  Upon ED arrival patient was placed on a nonrebreather mask and subsequently had to be placed on BiPAP due to worsening respiratory distress.  Originally code STEMI was called and cardiology was consulted.  Cardiology performed POCUS at bedside-with findings of LV enlargement/EF less than 15%.  EKG not indicative of STEMI but SVT noted with LVH.  Clinical picture consistent with acute CHF exacerbation with cardiogenic shock vs hypovolemic shock. Patient also found to not have been taking insulin  for the past week due to running out of medication.  Patient was started on Levophed  for hypotension, started on diuresis, and insulin  drip for hyperglycemia/DKA.  PCCM consulted for admission and further management.   Initial ED workup-sodium 136, potassium 3.1, chloride 95, CO2 21, glucose 705, creatinine 1.22, calcium  8.4, BNP greater than 4500, high-sensitivity troponin 63, lactic acid 6.5, WBC 9.2, hemoglobin 10.8   Upon assessment of patient, patient alert and oriented x 4, still having central chest pain.  Patient endorsed that chest pain had been ongoing for the last 2 to 3 days with progression.  Patient states that she has been in and out of hospice due to her heart failure.  Patient also asking for pain  to be addressed. Discussed with patient goals of care in regards to patient's severe heart failure-EF 15 to 20%.  Patient would like to focus on quality of life versus quantity and pursue hospice care. Discussed with patient that with her being on BiPAP as well as needing vasopressor support that hospital death could be likely.  Recommend focusing on quality of life versus quantity and eventually transition to comfort once she is ready.  Patient would like to wait on son to arrive before transitioning to comfort care.   Discharge Plan by Active Problems     Shock-hypovolemic in setting of DKA versus cardiogenic, patient has a known EF between 15 and 20%- could be mixed picture Lactic acid-6.5 initially>>3.4>>4.1  DKA Hypokalemia  HFrEF  CHF exacerbation  Patient came in with with SVT/atrial tachycardia per EKG, along with respiratory distress, blood glucose in 705.  Clinical picture consistent with dehydration in setting of early DKA. Patient now off BiPAP, on salter-8 L, not requiring Levophed  infusion.  Also received 120 mg of IV Lasix -total urine output 850 P: Pt was admitted to the ICU and placed on continuous cardiac telemetry and  insulin  drip on Endo tool Labs were trended and electrolytes replaced Pt met with palliative care and decided to transition to comfort care 7/7, the appropriate orders were placed    COPD hx  Tobacco abuse  P:   Initially BIPAP as needed, O2 sat goal > 92%  Place on Triple BD Therapy- yulperi, brovana , pulmicort   IS/flutter valve   Transitioned to comfort care, no air hunger on [place]  Bipolar Depression Anxiety  P:   Zoloft  and Ativan  for anxiety  Supportive Care      Significant Hospital tests/ studies  CXR 7/7 Cardiomegaly with left lower lobe airspace opacity and possible left effusion. This could reflect atelectasis or pneumonia.   Procedures    Culture data/antimicrobials   [facility] 7/5 Staph Epi   Consults  Cardiology Palliative care     Discharge Exam: BP 101/79   Pulse (!) 114   Temp 97.8 F (36.6 C) (Oral)   Resp (!) 23   Wt 75.7 kg   LMP  (LMP Unknown) Comment: Pt is poor historian  SpO2 92%   BMI 28.65 kg/m   General:  ill appearing F resting in bed in NAD  HEENT: MM pink/moist Neuro: alert and oriented  CV: s1s2 rrr, no m/r/g PULM:  diminished in the bilateral bases  GI: soft, bsx4 active  Extremities: warm/dry, 1+ edema  Skin: no rashes or lesions   Labs at discharge   Lab Results  Component Value Date   CREATININE 1.22 (H) 12/15/2023   BUN 32 (H) 12/15/2023   NA 135 12/15/2023   K 4.7 12/15/2023   CL 99 12/15/2023   CO2 24 12/15/2023   Lab Results  Component Value Date   WBC 8.2 12/14/2023   HGB 10.5 (L) 12/14/2023   HCT 35.2 (L) 12/14/2023   MCV 85.4 12/14/2023   PLT 183 12/14/2023   Lab Results  Component Value Date   ALT 37 12/13/2023   AST 30 12/13/2023   ALKPHOS 97 12/13/2023   BILITOT 1.5 (H) 12/13/2023   Lab Results  Component Value Date   INR 1.6 (H) 12/14/2023   INR 1.5 (H) 12/13/2023   INR 1.3 (H) 07/17/2020    Current radiological studies    DG Chest Port 1 View Result Date: 12/14/2023 CLINICAL DATA:  Shortness of breath, chest pain EXAM: PORTABLE CHEST 1 VIEW COMPARISON:  12/13/2023 FINDINGS: Cardiomegaly. Focal airspace opacity in the left lower lobe. Right lung clear. Possible left effusion. No acute bony abnormality. IMPRESSION: Cardiomegaly with left lower lobe airspace opacity and possible left effusion. This could reflect atelectasis or pneumonia. Electronically Signed   By: Franky Crease M.D.   On: 12/14/2023 23:30   DG Chest Portable 1 View Result Date: 12/13/2023 CLINICAL DATA:  Chest pain and dyspnea EXAM: PORTABLE CHEST 1 VIEW COMPARISON:  Chest radiograph and CT chest 11/14/2023 FINDINGS: Shallow inspiration. Cardiac enlargement. No vascular congestion or edema. Infiltrates seen previously are not demonstrated today, possibly due to portable technique. No  pleural effusion or pneumothorax. Mediastinal contours appear intact. IMPRESSION: Cardiac enlargement.  Lungs are clear. Electronically Signed   By: Elsie Gravely M.D.   On: 12/13/2023 19:37    Disposition:     There are no questions and answers to display.          Allergies as of 12/15/2023       Reactions   Tramadol  Nausea Only, Rash   Wellbutrin [bupropion] Other (See Comments)   Behavioral changes (aggression, irritability)   Benadryl  [diphenhydramine ] Other (See Comments)   Decreases motor skills and increases anxiety   Lactose Intolerance (gi) Diarrhea   Nicotine  Rash   Patient states she is allergic to the Nicotine  patch and they cause her to break out in a rash.  She states she can smoke cigarettes.        Medication List     STOP taking these medications    alum & mag hydroxide-simeth 200-200-20  MG/5ML suspension Commonly known as: MAALOX/MYLANTA   Breo Ellipta  200-25 MCG/ACT Aepb Generic drug: fluticasone  furoate-vilanterol   fentaNYL  100 MCG/HR Commonly known as: DURAGESIC    furosemide  20 MG tablet Commonly known as: LASIX    furosemide  40 MG tablet Commonly known as: Lasix    hydrocortisone  cream 0.5 %   HYDROmorphone  4 MG tablet Commonly known as: DILAUDID    ipratropium-albuterol  0.5-2.5 (3) MG/3ML Soln Commonly known as: DUONEB   lidocaine  5 % Commonly known as: LIDODERM    LORazepam  1 MG tablet Commonly known as: ATIVAN    pantoprazole  40 MG tablet Commonly known as: PROTONIX    polyethylene glycol 17 g packet Commonly known as: MIRALAX  / GLYCOLAX    polyethylene glycol powder 17 GM/SCOOP powder Commonly known as: GLYCOLAX /MIRALAX    senna 8.6 MG Tabs tablet Commonly known as: SENOKOT   sertraline  25 MG tablet Commonly known as: ZOLOFT    Ventolin  HFA 108 (90 Base) MCG/ACT inhaler Generic drug: albuterol        TAKE these medications    acetaminophen  325 MG tablet Commonly known as: TYLENOL  Take 2 tablets (650 mg total)  by mouth every 6 (six) hours as needed for mild pain (pain score 1-3) (or Fever >/= 101). What changed:  when to take this reasons to take this   antiseptic oral rinse Liqd Apply 15 mLs topically as needed for dry mouth.   artificial tears ophthalmic solution Place 1 drop into both eyes 4 (four) times daily as needed for dry eyes.   glycopyrrolate  1 MG tablet Commonly known as: ROBINUL  Take 1 tablet (1 mg total) by mouth every 4 (four) hours as needed (excessive secretions).   HYDROmorphone  50 mg in sodium chloride  0.9 % 45 mL Inject 1-10 mg/hr into the vein continuous.   ondansetron  4 MG disintegrating tablet Commonly known as: ZOFRAN -ODT Take 1 tablet (4 mg total) by mouth every 6 (six) hours as needed for nausea. What changed:  when to take this reasons to take this         Follow-up appointment    Discharge Condition:    stable   Leita SAUNDERS Kunta Hilleary, PA-C [facility] See Amion for pager If no response to pager , please call [phone] until 7pm After 7:00 pm call Elink  [phone]

## 2023-12-15 NOTE — Progress Notes (Addendum)
 Palliative Medicine Inpatient Follow Up Note HPI: 53 year old female with biventricular HFrEF, insulin -dependent diabetes, tobacco dependence and bipolar disorder who was admitted early this morning with chest pain and shortness of breath. Palliative care has been asked to support Giavanna in the setting of advanced disease and further goals of care conversations.   Today's Discussion 12/15/2023  *Please note that this is a verbal dictation therefore any spelling or grammatical errors are due to the Dragon Medical One system interpretation.  Chart reviewed inclusive of vital signs, progress notes, laboratory results, and diagnostic images.   I met with Beanca this morning, she is fully alert and oriented this morning to person, place, time, and situation. We reviewed that she had a crumby night in he setting of her profound pain and ongoing shortness of breath. She and I discussed the concerns associated with her biventricular heart failure and her poor overall prognosis.   We discussed that Marbella wants to be comfortable. She does not want to suffer as she feels she is at the end of life. We talked about transition to comfort measures in house and what that would entail inclusive of medications to control pain, dyspnea, agitation, nausea, itching, and hiccups.  We discussed stopping all uneccessary measures such as cardiac monitoring, blood draws, needle sticks, and frequent vital signs.   I shared we will start a low dose dilaudid  gtt to help support symptoms. Charl is in agreement and understands that her time on earth will be limited.   Wm and I discussed her getting prayer to better support her end of life journey.   Created space and opportunity for patient to explore thoughts feelings and fears regarding current medical situation.She reflects on her life and how difficult it has been. Utilized reflective listening during our time together.  Smriti is in agreement with a referral to [facility] home.  I have called patients son, Dorise three times though it rings through. Dorise was updated yesterday on the severity of patients current illness(s) and poor likely outcomes.  I have called and spoke to patients best friend, Lorie this morning who plans to come in this morning to support Haiven who she shares is like her sister.  Plan for comfort measures at this time.   Questions and concerns addressed/Palliative Support Provided.   Objective Assessment: Vital Signs Vitals:   12/15/23 0715 12/15/23 0805  BP: 99/72   Pulse: 96   Resp: (!) 9   Temp:  97.8 F (36.6 C)  SpO2: 97%     Intake/Output Summary (Last 24 hours) at 12/15/2023 0940 Last data filed at 12/15/2023 0600 Gross per 24 hour  Intake 1437.09 ml  Output 0 ml  Net 1437.09 ml   Last Weight  Most recent update: 12/15/2023  5:58 AM    Weight  75.7 kg (166 lb 14.2 oz)            Gen:  Middle aged Caucasian F chronically ill appearing HEENT: moist mucous membranes CV: Regular rate and rhythm  PULM:  On 3LPM [place], breathing is labored ABD: soft/nontender  EXT: No edema  Neuro: Alert and oriented x3  SUMMARY OF RECOMMENDATIONS   DNAR/DNI   Comfort Measures  Will initiate low dose dilaudid  gtt  Additional comfort medication per [facility]  Unrestricted visitation  Referral to [facility] inpatient hospice   Ongoing PMT support ______________________________________________________________________________________ Rosaline Becton [facility] Palliative Medicine Team Team Cell Phone: [phone] Please utilize secure chat with additional questions, if there is  no response within 30 minutes please call the above phone number  Time Spent: 65 Billing based on MDM: High  Palliative Medicine Team providers are available by phone from 7am to 7pm daily and can be reached through the team cell phone.  Should this patient require assistance outside of these hours, please call the patient's attending  physician.

## 2023-12-16 LAB — CULTURE, BLOOD (ROUTINE X 2): Special Requests: ADEQUATE

## 2023-12-18 LAB — CULTURE, BLOOD (ROUTINE X 2)
Culture: NO GROWTH
Special Requests: ADEQUATE

## 2024-03-03 ENCOUNTER — Other Ambulatory Visit (HOSPITAL_COMMUNITY): Payer: Self-pay
